# Patient Record
Sex: Male | Born: 1950
Health system: Southern US, Community
[De-identification: ages and names within clinical notes are randomized; demographics above are authoritative.]

## PROBLEM LIST (undated history)

## (undated) DIAGNOSIS — I712 Thoracic aortic aneurysm, without rupture: Secondary | ICD-10-CM

## (undated) DIAGNOSIS — C4490 Unspecified malignant neoplasm of skin, unspecified: Secondary | ICD-10-CM

## (undated) DIAGNOSIS — K5790 Diverticulosis of intestine, part unspecified, without perforation or abscess without bleeding: Secondary | ICD-10-CM

## (undated) DIAGNOSIS — E782 Mixed hyperlipidemia: Secondary | ICD-10-CM

## (undated) DIAGNOSIS — Z9289 Personal history of other medical treatment: Secondary | ICD-10-CM

## (undated) DIAGNOSIS — I447 Left bundle-branch block, unspecified: Secondary | ICD-10-CM

## (undated) DIAGNOSIS — R918 Other nonspecific abnormal finding of lung field: Secondary | ICD-10-CM

## (undated) DIAGNOSIS — D72829 Elevated white blood cell count, unspecified: Secondary | ICD-10-CM

## (undated) DIAGNOSIS — R161 Splenomegaly, not elsewhere classified: Secondary | ICD-10-CM

## (undated) DIAGNOSIS — E875 Hyperkalemia: Secondary | ICD-10-CM

## (undated) DIAGNOSIS — J8410 Pulmonary fibrosis, unspecified: Secondary | ICD-10-CM

## (undated) DIAGNOSIS — D500 Iron deficiency anemia secondary to blood loss (chronic): Secondary | ICD-10-CM

## (undated) DIAGNOSIS — E118 Type 2 diabetes mellitus with unspecified complications: Secondary | ICD-10-CM

## (undated) DIAGNOSIS — I2510 Atherosclerotic heart disease of native coronary artery without angina pectoris: Secondary | ICD-10-CM

## (undated) DIAGNOSIS — I7121 Aneurysm of the ascending aorta, without rupture: Secondary | ICD-10-CM

## (undated) DIAGNOSIS — I10 Essential (primary) hypertension: Secondary | ICD-10-CM

## (undated) DIAGNOSIS — M109 Gout, unspecified: Secondary | ICD-10-CM

## (undated) DIAGNOSIS — I5032 Chronic diastolic (congestive) heart failure: Secondary | ICD-10-CM

## (undated) DIAGNOSIS — K7581 Nonalcoholic steatohepatitis (NASH): Principal | ICD-10-CM

## (undated) DIAGNOSIS — I714 Abdominal aortic aneurysm, without rupture: Secondary | ICD-10-CM

## (undated) DIAGNOSIS — K7460 Unspecified cirrhosis of liver: Secondary | ICD-10-CM

## (undated) DIAGNOSIS — K7681 Hepatopulmonary syndrome: Secondary | ICD-10-CM

## (undated) DIAGNOSIS — E669 Obesity, unspecified: Secondary | ICD-10-CM

## (undated) DIAGNOSIS — K8020 Calculus of gallbladder without cholecystitis without obstruction: Secondary | ICD-10-CM

## (undated) DIAGNOSIS — N281 Cyst of kidney, acquired: Secondary | ICD-10-CM

## (undated) DIAGNOSIS — D509 Iron deficiency anemia, unspecified: Secondary | ICD-10-CM

## (undated) DIAGNOSIS — K7290 Hepatic failure, unspecified without coma: Secondary | ICD-10-CM

## (undated) DIAGNOSIS — R3129 Other microscopic hematuria: Secondary | ICD-10-CM

## (undated) HISTORY — DX: Splenomegaly, not elsewhere classified: R16.1

## (undated) HISTORY — DX: Left bundle-branch block, unspecified: I44.7

## (undated) HISTORY — DX: Diverticulosis of intestine, part unspecified, without perforation or abscess without bleeding: K57.90

## (undated) HISTORY — DX: Other microscopic hematuria: R31.29

## (undated) HISTORY — DX: Calculus of gallbladder without cholecystitis without obstruction: K80.20

## (undated) HISTORY — PX: TRANSTHORACIC ECHOCARDIOGRAM: SHX275

## (undated) HISTORY — DX: Cyst of kidney, acquired: N28.1

## (undated) HISTORY — DX: Unspecified cirrhosis of liver: K74.60

## (undated) HISTORY — DX: Elevated white blood cell count, unspecified: D72.829

## (undated) HISTORY — DX: Iron deficiency anemia secondary to blood loss (chronic): D50.0

## (undated) HISTORY — DX: Mixed hyperlipidemia: E78.2

## (undated) HISTORY — DX: Aneurysm of the ascending aorta, without rupture: I71.21

## (undated) HISTORY — PX: COLONOSCOPY: SHX174

## (undated) HISTORY — DX: Nonalcoholic steatohepatitis (NASH): K75.81

## (undated) HISTORY — PX: CARDIOVASCULAR STRESS TEST: SHX262

## (undated) HISTORY — DX: Obesity, unspecified: E66.9

## (undated) HISTORY — DX: Pulmonary fibrosis, unspecified: J84.10

## (undated) HISTORY — DX: Chronic diastolic (congestive) heart failure: I50.32

## (undated) HISTORY — DX: Hepatic failure, unspecified without coma: K72.90

## (undated) HISTORY — DX: Type 2 diabetes mellitus with unspecified complications: E11.8

## (undated) HISTORY — DX: Atherosclerotic heart disease of native coronary artery without angina pectoris: I25.10

## (undated) HISTORY — PX: OTHER SURGICAL HISTORY: SHX169

## (undated) HISTORY — DX: Gout, unspecified: M10.9

## (undated) HISTORY — DX: Essential (primary) hypertension: I10

## (undated) HISTORY — DX: Hyperkalemia: E87.5

## (undated) HISTORY — DX: Other nonspecific abnormal finding of lung field: R91.8

## (undated) HISTORY — PX: CARDIAC CATHETERIZATION: SHX172

## (undated) HISTORY — PX: TRANSCATHETER AORTIC VALVE REPLACEMENT, TRANSFEMORAL: SHX6400

## (undated) HISTORY — DX: Hepatopulmonary syndrome: K76.81

## (undated) HISTORY — DX: Abdominal aortic aneurysm, without rupture: I71.4

## (undated) HISTORY — PX: CORONARY ANGIOPLASTY: SHX604

## (undated) HISTORY — DX: Thoracic aortic aneurysm, without rupture: I71.2

## (undated) HISTORY — DX: Iron deficiency anemia, unspecified: D50.9

---
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
## 1975-02-08 HISTORY — PX: VASECTOMY: SHX75

## 1989-02-07 HISTORY — PX: NECK SURGERY: SHX720

## 1990-02-07 HISTORY — PX: CERVICAL DISCECTOMY: SHX98

## 2003-04-14 ENCOUNTER — Encounter (INDEPENDENT_AMBULATORY_CARE_PROVIDER_SITE_OTHER): Payer: Self-pay | Admitting: *Deleted

## 2003-04-14 ENCOUNTER — Ambulatory Visit (HOSPITAL_COMMUNITY): Admission: RE | Admit: 2003-04-14 | Discharge: 2003-04-14 | Payer: Self-pay | Admitting: Gastroenterology

## 2004-10-20 ENCOUNTER — Emergency Department (HOSPITAL_COMMUNITY): Admission: EM | Admit: 2004-10-20 | Discharge: 2004-10-20 | Payer: Self-pay | Admitting: Emergency Medicine

## 2006-03-10 HISTORY — PX: CORONARY ARTERY BYPASS GRAFT: SHX141

## 2006-03-28 ENCOUNTER — Encounter: Admission: RE | Admit: 2006-03-28 | Discharge: 2006-03-28 | Payer: Self-pay | Admitting: *Deleted

## 2006-03-31 ENCOUNTER — Ambulatory Visit: Admission: RE | Admit: 2006-03-31 | Discharge: 2006-03-31 | Payer: Self-pay | Admitting: *Deleted

## 2006-04-04 ENCOUNTER — Ambulatory Visit: Payer: Self-pay | Admitting: Cardiothoracic Surgery

## 2006-04-04 ENCOUNTER — Encounter: Payer: Self-pay | Admitting: Vascular Surgery

## 2006-04-05 ENCOUNTER — Ambulatory Visit: Payer: Self-pay | Admitting: Cardiothoracic Surgery

## 2006-04-05 ENCOUNTER — Inpatient Hospital Stay (HOSPITAL_COMMUNITY): Admission: RE | Admit: 2006-04-05 | Discharge: 2006-04-12 | Payer: Self-pay | Admitting: *Deleted

## 2006-04-27 ENCOUNTER — Ambulatory Visit: Payer: Self-pay | Admitting: Cardiothoracic Surgery

## 2006-04-27 ENCOUNTER — Encounter: Admission: RE | Admit: 2006-04-27 | Discharge: 2006-04-27 | Payer: Self-pay | Admitting: Cardiothoracic Surgery

## 2006-05-04 ENCOUNTER — Encounter (HOSPITAL_COMMUNITY): Admission: RE | Admit: 2006-05-04 | Discharge: 2006-08-02 | Payer: Self-pay | Admitting: *Deleted

## 2009-01-09 ENCOUNTER — Ambulatory Visit (HOSPITAL_COMMUNITY): Admission: RE | Admit: 2009-01-09 | Discharge: 2009-01-09 | Payer: Self-pay | Admitting: Gastroenterology

## 2010-06-25 NOTE — Discharge Summary (Signed)
William [name], William [name]               ACCOUNT NO.:  [id number]   MEDICAL RECORD NO.:  [id number]          PATIENT TYPE:  INP   LOCATION:  2023                         FACILITY:  [facility]   PHYSICIAN:  Lanelle Bal, MD    DATE OF BIRTH:  Jan 06, 1951   DATE OF ADMISSION:  04/05/2006  DATE OF DISCHARGE:                               DISCHARGE SUMMARY   DISCHARGE DATE:  Tentatively in 1 to 2 days.   ADMISSION DIAGNOSES:  Coronary artery disease.   DISCHARGE DIAGNOSES:  1. Coronary artery disease status post coronary artery bypass graft.  2. Postop ileus, now resolved.  3. Postop volume overload.  4. Postop acute blood loss anemia.  5. Hypertension.  6. Hyperlipidemia.   ALLERGIES:  NO KNOWN DRUG ALLERGIES.   PROCEDURES:  1. On April 04, 2006, the patient underwent cardiac catheterization      by Dr. Tami Ribas.  2. On April 05, 2006, the patient underwent coronary artery bypass      grafts x3 (left internal mammary artery to the left anterior      descending, saphenous vein graft to the right coronary artery,      saphenous vein graft to the circumflex), open saphenous vein      harvest, bilateral lower leg,  by Dr. Lanelle Bal.   CONSULTS:  Dr. Servando Snare was consulted on April 04, 2006, for  cardiothoracic surgery.   HISTORY AND PHYSICAL:  This is a 60 year old male with a history of  hypertension, hyperlipidemia, who is referred to the cardiology office  for exertional chest pain, which occurs with any significant exertion,  which is nitro responsive.  He was brought for a cardiac catheterization  on February 26 to rule out significant coronary artery disease.  He does  have a remote smoking history but has quit since 1983.   HOSPITAL COURSE:  The patient presents to the cath lab, and during  cardiac catheterization, showed significant three-vessel coronary artery  disease, normal left ventricular systolic function, small infrarenal  aneurysm, elevated left  ventricular end diastolic pressure, and systemic  hypertension.  The patient has had unstable angina concerns  progressively within the past 2-3 weeks and now presents with severe  left anterior descending disease, and totally occluded right and  relatively well-preserved left ventricular function.  It was best  thought that the patient undergo coronary artery bypass surgery in the  morning.  The patient underwent CABG x3 on April 05, 2006, without  any complications.  The patient was transferred to the SICU  postoperatively.  The patient's stay has been uneventful, and he has  been progressing as expected.   The patient has had a postop ileus.  The patient was kept n.p.o.  He was  nauseated on postoperative day #1, and he was given Zofran with relief.  The patient's abdomen was distended, and his bowel sounds were decreased  on postoperative day #3.  The patient did have positive flatus on  postoperative day #3.  On postoperative day #4, the patient's abdomen  was softer with normal bowel sounds, and his diet has been advanced  fully.  The patient is now on a regular heart-healthy diet, which he is  tolerating quite well.  His abdomen has bowel sounds positive, soft,  nontender, and nondistended.   The patient did have some acute blood loss anemia, did not require any  blood transfusions.  His hemoglobin and hematocrit have been stable.  Postoperatively, he had some volume overload.  He was diuresed with  Lasix and responding appropriately.  Currently, his weight is 104.7  kilograms, and his postoperative weight is 108 kilograms.   The patient has been able to be weaned from his vent without any  complication.  The patient has been breathing on room air with sats of  92%.  He has been doing his breathing exercises appropriately.  The  patient has mild serosanguineous drainage on his upper sternum on  postoperative day #5; however, by postoperative day #6, this has  resolved.    Postoperatively, the patient's blood pressure has been high, and his  Lopressor has been titrated up.  He is currently on 100 mg p.o. b.i.d.  The patient is slightly tachycardic.  Postoperative day #6, he was  started on his home dose of Avapro 150 mg.  This will be monitored  closely.  If any adjustments need to be made, this will be done in the  morning.  The patient has remained afebrile.  Renal function has been  intact with a BUN of 14 and creatinine of 0.7.   The patient is ambulating with cardiac rehab with a steady gait.  He  walked 690 feet on April 10, 2006.  The patient has been maintaining  sinus rhythm on telemetry.   DISCHARGE DISPOSITION:  The patient will be discharged home in the next  1-2 days provided his rhythm, blood pressure, and other vitals remain  stable.   DISCHARGE MEDICATIONS:  Include:  1. Lopressor 100 mg p.o. b.i.d.  2. Aspirin 325 mg p.o. daily.  3. Avapro 150 mg p.o. daily.  4. Crestor 20 mg p.o. daily.  5. Niacin 500 mg p.o. daily.  6. Lasix 40 mg p.o. daily for 4 days.  7. Potassium chloride 20 mEq p.o. daily for 4 days.  8. Oxycodone 5 mg 1 to 2 tabs every 4 hours p.r.n.   INSTRUCTIONS:  The patient is instructed to follow a low-fat, low-salt  diet.  No driving, heavy lifting greater than 10 pounds for 3 weeks.  The patient is to ambulate 3-4 times daily and increase activity as  tolerated.  He is to continue his breathing exercises.  He may shower  and clean his incisions with mild soap and water.  He should call our  office if any wound problems should arise, if his incision is  erythematous, drainage, if temp greater than 101.5.   FOLLOWUP:  The patient has a followup appointment with Dr. Servando Snare in 3  weeks, to contact him with time and date of appointment.  He is to have  a chest x-ray at [facility] prior to seeing Dr.  Servando Snare.  The patient is to call Dr. Tami Ribas for an appointment in 2  weeks.     Richardson Dopp, [place]      Lanelle Bal, MD  Electronically Signed    JMW/MEDQ  D:  04/11/2006  T:  04/11/2006  Job:  [id number]   cc:   Lanelle Bal, MD  Octavia Heir, MD  Esperanza Richters, MD

## 2010-06-25 NOTE — Op Note (Signed)
William [name], AHLGREN NO.:  [id number]   MEDICAL RECORD NO.:  [id number]          PATIENT TYPE:  INP   LOCATION:  2023                         FACILITY:  [facility]   PHYSICIAN:  Lanelle Bal, MD    DATE OF BIRTH:  September 09, 1950   DATE OF PROCEDURE:  04/05/2006  DATE OF DISCHARGE:                               OPERATIVE REPORT   PREOPERATIVE DIAGNOSIS:  Coronary occlusive disease.   POSTOPERATIVE DIAGNOSIS:  Coronary occlusive disease.   SURGICAL PROCEDURE:  Coronary artery bypass grafting times three with  the left internal mammary to the left anterior descending, reverse  saphenous vein graft to the circumflex and reverse saphenous vein graft  to the posterior descending coronary artery with failed attempt at  endovein harvesting.   SURGEON:  Dr. Servando Snare.   FIRST ASSISTANT:  Suzzanne Cloud, P.A.   BRIEF HISTORY:  The patient is a 60 year old male who presents with  unstable anginal symptoms.  Because of unstable anginal symptoms he  underwent cardiac catheterization which demonstrated high grade LAD  stenosis, occlusion of the posterior descending coronary artery and  proximal circumflex obstruction.  Because of the patient's three vessel  disease, coronary artery bypass grafting was recommended, the patient  agreed and signed informed consent.  Attempts at endovein were started.  A small incision was made first at the right knee and then at the left  knee, but neither could a satisfactory vein be located and harvested,  and therefore a segment of lower leg vein from each lower extremity was  harvested with an open technique.  A median sternotomy was performed,  the left internal mammary artery was dissected down as a pedicle graft,  the distal artery was divided and had good free flow.  The pericardium  was opened and overall showed preserved LV function.  The patient at the  time of catheterization noted to have significantly elevated left end-  diastolic  pressures.  Patient was systemically heparinized, the  ascending aortic in the right atrium were cannulated, and aortic root  bent cardioplegia needle was introduced into the ascending aorta.  The  patient was placed on cardiopulmonary bypass 2.4L/min per m2, sites of  anastomosis were inspected and dissected out of the epicardium.  The  patient's body temperature was cooled to 30 degrees, aortic crossclamp  was applied and 500 mL of cold blood potassium cardioplegia was  administered with rapid diastolic arrest of the heart, myocardial septal  temperatures were monitored throughout the crossclamp.  Attention was  turned first to the posterior descending coronary artery which was  opened and admitted a 1.5 mm probe.  Using a running #7-0 Prolene,  distal anastomosis was performed.  Attention was then turned to the  circumflex which was opened, and using a running #7-0 Prolene distal  anastomosis was performed.  Attention was then turned to the left  anterior descending coronary artery.  In the mid portion, the vessel was  opened, admitted a 1.5 mm probe.  Using a running #8-0 Prolene, the left  internal mammary artery was anastomosed to the left anterior descending  coronary artery.  With the crossclamp still in place, two punch  aortotomies were performed.  Each of the two vein grafts were  anastomosed to the ascending aorta.  Prior to completion of the final  proximal anastomosis the heart was allowed to passively fill and de-air.  Aortic crossclamp was removed.  Total crossclamp time was 66 minutes.  The patient spontaneously converted to a sinus rhythm.  Sites of  anastomosis were inspected and were free of bleeding.  Ventricle and  atrial pacing wires were placed.  The patient was then ventilated and  weaned from cardiopulmonary bypass on low dose milrinone and dopamine.  He remained hemodynamically stable.  He was decannulated in the usual  fashion.  Protamine sulfate was  administered.  With the operative field  hemostatic, two atrial tubes and two ventricular pacing wires were  applied.  A graft marker was applied.  A left pleural tube and a single  Blake mediastinal drain were placed.  Sternum was closed with a #6  stainless steel wire.  Fascia closed with interrupted #0 Vicryl, running  #3-0 Vicryl in subcutaneous tissue, #4-0 subcuticular stitch in the skin  edges.  Dry dressings were applied.  The sponge and needle count was  reported as correct at the completion of the procedure.  The patient  tolerated the procedure without obvious complication and was transferred  to the surgical intensive care unit.      Lanelle Bal, MD  Electronically Signed     EG/MEDQ  D:  04/09/2006  T:  04/09/2006  Job:  [id number]   cc:   Quay Burow, M.D.

## 2010-06-25 NOTE — Op Note (Signed)
William [name], William [name]                           ACCOUNT NO.:  [id number]   MEDICAL RECORD NO.:  [id number]                   PATIENT TYPE:  AMB   LOCATION:  ENDO                                 FACILITY:  [facility]   PHYSICIAN:  Nelwyn Salisbury, M.D.               DATE OF BIRTH:  11-25-1950   DATE OF PROCEDURE:  04/14/2003  DATE OF DISCHARGE:                                 OPERATIVE REPORT   PROCEDURE PERFORMED:  Colonoscopy with snare polypectomy x2.   ENDOSCOPIST:  Nelwyn Salisbury, M.D.   INSTRUMENT USED:  Olympus video colonoscope.   INDICATION FOR PROCEDURE:  A 60 year old white male undergoing screening  colonoscopy to rule out colonic polyps, masses, etc.   PREPROCEDURE PREPARATION:  Informed consent was procured from the patient.  The patient was fasted for eight hours prior to the procedure and prepped  with a bottle of magnesium citrate and a gallon of GoLYTELY the night prior  to the procedure.   PREPROCEDURE PHYSICAL:  VITAL SIGNS:  The patient had stable vital signs.  NECK:  Supple.  CHEST:  Clear to auscultation.  S1, S2 regular.  ABDOMEN:  Soft with normal bowel sounds.   DESCRIPTION OF PROCEDURE:  The patient was placed in the left lateral  decubitus position and sedated with 50 mg of Demerol and 6 mg of Versed  intravenously.  Once the patient was adequately sedate and maintained on low-  flow oxygen and continuous cardiac monitoring, the Olympus video colonoscope  was advanced from the rectum to the cecum with difficulty.  There was some  residual stool in the colon.  Multiple washes were done.  The patient's  position was changed from the left lateral to the supine position to reach  the cecal base.  The appendiceal orifice and the ileocecal valve were  visualized and photographed.  A small sessile polyp was snared from the mid-  right colon and a smaller flat polyp was snared from the proximal transverse  colon at the hepatic flexure.  Retroflexion in the rectum  revealed small  internal hemorrhoids.  The patient tolerated the procedure well without  complications.   IMPRESSION:  1. Small, nonbleeding internal hemorrhoids.  2. Small polyp snared from mid-right colon, another larger polyp snared from     transverse colon at the hepatic flexure.  Both were placed in the same     bottle.  3. A large amount of residual stool in the colon, small lesions could have     been missed.   RECOMMENDATIONS:  1. Await pathology results.  2. Avoid nonsteroidals, including aspirin, for the next four weeks.  3. Outpatient follow-up in the next two weeks for further recommendations.  4. Repeat CRC screening depending on pathology results.  Nelwyn Salisbury, M.D.    JNM/MEDQ  D:  04/14/2003  T:  04/15/2003  Job:  [id number]   cc:   Youlanda Roys. Deatra Ina, M.D.  [address]  [place]  [place] [address]  Fax: [phone]

## 2010-06-25 NOTE — Consult Note (Signed)
William, [name] NO.:  [id number]   MEDICAL RECORD NO.:  [id number]          PATIENT TYPE:  OIB   LOCATION:  2807                         FACILITY:  [facility]   PHYSICIAN:  Lanelle Bal, MD    DATE OF BIRTH:  May 02, 1950   DATE OF CONSULTATION:  04/04/2006  DATE OF DISCHARGE:                                 CONSULTATION   CARDIAC SURGERY CONSULTATION   FOLLOWUP CARDIOLOGIST:  Dr. Tami Ribas.   PRIMARY CARE PHYSICIAN:  Dr. Tawni Millers at [facility].   The patient presents to the Cath Lab today for cardiac catheterization  and an urgent consult.  He has a previous history of hypertension,  hyperlipidemia, and over the past 2-3 weeks has had increasing episodes  of substernal chest discomfort radiating to the left upper chest and  migrating down his arms and into the left wrist usually associated with  exertion, such as climbing stairs or carrying wood.  He denies shortness  of breath.  Denies nausea or diaphoresis.  The pain is resolved with  rest.  Over the past week, he had noted increasing frequency and a  decreasing level of exertion that brings on the discomfort.  Because of  these symptoms, he was referred to Dr. Tami Ribas for cardiac  catheterization today.  He has had no previous history of myocardial  infarction.  Does have hypertension, hyperlipidemia.  Denies diabetes.  Is a remote smoker, quit in 1983.  He has a positive family history for  cardiac disease.  Father had a myocardial infarction in his 51s.  No  coronary disease noted in his mother.  He has 1 brother who at age 31  had a myocardial infarction. Older brother died approximately 1 week ago  from cancer.  The patient has had no previous stroke.  Denies  claudication.  Denies renal insufficiency.   PREVIOUS SURGERY:  Included:  1. Cervical spine fusion.  2. Polypectomy, right colon, 2005.   SOCIAL HISTORY:  The patient is married and lives with his wife, [name],  who works as  a Marine scientist at [employer].  Plays golf once a week, employed as  a Curator.   MEDICATIONS AT THE TIME OF ADMISSION:  Include:  1. Avapro 150 mg b.i.d.  2. Aspirin 81 mg daily.  3. Crestor 20 mg daily.  4. Ziac 6.25 daily.  5. Lopid 600 mg b.i.d.   He denies any allergies.   There is no record of Plavix being given in the hospital.   REVIEW OF SYSTEMS:  CARDIAC:  Positive for chest pain.  He denies  resting shortness of breath, exertional shortness of breath, orthopnea,  presyncope, syncope, palpitations, lower extremity edema.  GENERAL:  The  patient is emotionally labile currently with the recent loss of his  brother secondary to cancer a week and a half ago.  RESPIRATORY:  Denies  any recent respiratory infections or hemoptysis.  He has had a history  of small internal hemorrhoids.  Has a history of cervical spondylosis  but, once operated on, this has not caused him any further trouble.  Denies any urinary  problems.  Denies recent infection.  Denies for easy  bruisability.  Other review of systems is negative.   PHYSICAL EXAM:  Blood pressure 113/86.  Pulse is 88 and regular.  Respiratory rate 25.  O2 sats 97%.  He is 5 feet 7 inches tall, weighs  237 pounds.  The patient is awake, alert, and neurologically intact.  He has no carotid bruits.  No jugular venous distention.  LUNGS:  Clear bilaterally.  CARDIAC:  Regular rate and rhythm without murmur or gallop.  ABDOMEN:  Soft without palpable masses or tenderness.  He has 2+ DP and  PT pulses bilaterally.   LABORATORY FINDINGS:  Included a white count of 9.2, hematocrit of 48.8,  platelet count 319.  INR 1.1, PTT 41.  BUN is 10.  Creatinine 0.9.  Glucose 82.  Cardiac catheterization films are reviewed.  He has luminal  irregularities in his left main, very high-grade.  A proximal LAD lesion  that is very close to the takeoff of the circumflex.  It was not felt to  be suitable for angioplasty.  The right coronary artery is  totally  occluded.  He has a small first obtuse marginal with 90% that is  probably too small to bypass.   IMPRESSION:  Patient with unstable anginal symptoms progressing over the  past 2-3 weeks, who now presents with severe left anterior descending  disease and totally occluded right and relatively well-preserved left  ventricular function.  I agree with Dr. Tami Ribas to proceed with coronary  artery bypass grafting sooner rather than later.  Tentatively, the risks  and options have been discussed with the patient and his family in  detail.  He is willing to proceed.  We will plan to keep the patient in  the hospital and proceed with bypass surgery tomorrow morning as long as  he remains stable without pain.  The risks of surgery include death,  infection, stroke, myocardial infarction, bleeding, and blood  transfusion were all discussed in detail with the patient and he is  willing to proceed.      Lanelle Bal, MD  Electronically Signed     EG/MEDQ  D:  04/04/2006  T:  04/04/2006  Job:  [id number]

## 2010-06-25 NOTE — Cardiovascular Report (Signed)
William [name], BOUWMAN               ACCOUNT NO.:  [id number]   MEDICAL RECORD NO.:  [id number]          PATIENT TYPE:  INP   LOCATION:  2399                         FACILITY:  [facility]   PHYSICIAN:  Octavia Heir, MD  DATE OF BIRTH:  1950-10-14   DATE OF PROCEDURE:  DATE OF DISCHARGE:                            CARDIAC CATHETERIZATION   PROCEDURE:  1. Left heart catheterization.  2. Coronary angiography.  3. Left ventriculogram.  4. Abdominal aortogram.   ATTENDING PHYSICIAN:  Alla German, MD.   COMPLICATIONS:  None.   INDICATION:  Mr. Hallowell is a 60 year old male, patient of Dr. Esperanza Richters, with a history of hypertension and hyperlipidemia who was  referred to our office for exertional chest pain which occurs with any  significant exertion which is nitro responsive.  He is now brought for a  cardiac catheterization to rule out significant CAD.  He does have a  remote smoking history but has been quit since 1983.   DESCRIPTION OF PROCEDURE:  After obtaining informed written consent,  patient brought to the cardiac cath lab, right groin shaved, prepped and  draped in the usual fashion.  Anesthesia monitor established.  Using a  modified Seldinger technique, a 6-French intraarterial sheath inserted  in the right femoral artery.  A 6-French diagnostic catheter was used to  perform diagnostic angiography.   The left main is a medium-to-large-sized vessel with a small aneurysm  noted but no significant disease.   The LAD is a large vessel which courses to the apex and gives  collaterals to the distal RCA.  The ostial LAD is noted to have a 70%  stenosis followed by 99% proximal stenosis.  There is no further  significant disease of the LAD.   The left coronary artery is a medium-to-large-sized ramus intermedius  which gives rise to its proximal segment no significant disease.   The left circumflex is a medium-sized vessel which courses to the AV  groove and two  obtuse marginal branches.  The AV groove circumflex is  noted to have mild 30% mid vessel narrowing.   The first diagonal is a small vessel with 90% ostial lesion.   The second OM is a medium-sized vessel with no significant disease.   The right coronary artery is subtotally occluded in its mid vessel.  The  distal RCA is filling via collaterals from the left.   Left ventriculogram reveals a low-normal EF of 50% with inferior  hypokinesis.   Abdominal aortogram reveals a small infrarenal aneurysm.   Hemodynamics:  Systemic arterial pressure 170/112, LV systemic pressure  174/17, LVEDP of 36.   CONCLUSION:  1. Significant 3-vessel coronary artery disease.  2. Normal left ventricular systolic function.  3. Small infrarenal aneurysm.  4. Elevated left ventricular end diastolic pressure.  5. Systemic hypertension.      Octavia Heir, MD  Electronically Signed     RHM/MEDQ  D:  04/04/2006  T:  04/05/2006  Job:  [id number]   cc:   Esperanza Richters, MD

## 2011-12-22 ENCOUNTER — Other Ambulatory Visit (HOSPITAL_COMMUNITY): Payer: Self-pay

## 2011-12-22 DIAGNOSIS — I2510 Atherosclerotic heart disease of native coronary artery without angina pectoris: Secondary | ICD-10-CM

## 2012-01-17 ENCOUNTER — Ambulatory Visit (HOSPITAL_COMMUNITY)
Admission: RE | Admit: 2012-01-17 | Discharge: 2012-01-17 | Disposition: A | Discharge status: Home / Self Care | Payer: 59 | Source: Ambulatory Visit | Attending: Cardiovascular Disease | Admitting: Cardiovascular Disease

## 2012-01-17 DIAGNOSIS — I10 Essential (primary) hypertension: Secondary | ICD-10-CM | POA: Insufficient documentation

## 2012-01-17 DIAGNOSIS — E669 Obesity, unspecified: Secondary | ICD-10-CM | POA: Insufficient documentation

## 2012-01-17 DIAGNOSIS — E785 Hyperlipidemia, unspecified: Secondary | ICD-10-CM | POA: Insufficient documentation

## 2012-01-17 DIAGNOSIS — I2510 Atherosclerotic heart disease of native coronary artery without angina pectoris: Secondary | ICD-10-CM | POA: Insufficient documentation

## 2012-01-17 MED ORDER — TECHNETIUM TC 99M SESTAMIBI GENERIC - CARDIOLITE
25.1000 | Freq: Once | INTRAVENOUS | Status: AC | PRN
  Administered 2012-01-17: 25.1 via INTRAVENOUS

## 2012-01-17 MED ORDER — TECHNETIUM TC 99M SESTAMIBI GENERIC - CARDIOLITE
8.4000 | Freq: Once | INTRAVENOUS | Status: AC | PRN
  Administered 2012-01-17: 8 via INTRAVENOUS

## 2012-01-17 NOTE — Procedures (Addendum)
[name]  Cardiology Nuclear Med Study  William [name] is a 61 y.o. male     MRN : [id number]     DOB: Apr 17, 1950  Procedure Date: 01/17/2012  Nuclear Med Background Indication for Stress Test:  Graft Patency History:  CAD Cardiac Risk Factors: Hypertension, Lipids and Obesity  Symptoms:  None   Nuclear Pre-Procedure Caffeine/Decaff Intake:  7:00pm NPO After: 5:00am   IV Site: R Hand  IV 0.9% NS with Angio Cath:  22g  Chest Size (in):  52 IV Started by: Otho Perl, CNMT  Height: 5' 7"  (1.702 m)  Cup Size: n/a  BMI:  Body mass index is 39.94 kg/(m^2). Weight:  255 lb (115.667 kg)   Tech Comments:  n/a    Nuclear Med Study 1 or 2 day study: 1 day  Stress Test Type:  Stress  Order Authorizing Provider:  Shelva Majestic, MD   Resting Radionuclide: Technetium 29mSestamibi  Resting Radionuclide Dose: 8.4 mCi   Stress Radionuclide:  Technetium 974mestamibi  Stress Radionuclide Dose: 25.1 mCi           Stress Protocol Rest HR: 101 Stress HR:162  Rest BP: 148/89 Stress BP:215/98  Exercise Time (min): 08:01 METS: 10.10          Dose of Adenosine (mg):  n/a Dose of Lexiscan: n/a mg  Dose of Atropine (mg): n/a Dose of Dobutamine: n/a mcg/kg/min (at max HR)  Stress Test Technologist: GwMellody MemosCCT Nuclear Technologist: PaImagene RichesCNMT   Rest Procedure:  Myocardial perfusion imaging was performed at rest 45 minutes following the intravenous administration of Technetium 9947mstamibi. Stress Procedure:  The patient performed treadmill exercise using a Bruce  Protocol for 08:01 minutes. The patient stopped due to extreme shortness of breath and fatigue.Patient denied any chest pain.  There were no significant ST-T wave changes.  Technetium 83m71mtamibi was injected at peak exercise and myocardial perfusion imaging was performed after a brief delay.  Transient  Ischemic Dilatation (Normal <1.22):  1.0 Lung/Heart Ratio (Normal <0.45):  0.37 QGS EDV:  74  ml QGS ESV:  26  ml LV Ejection Fraction: 64%      Rest ECG: NSR with non-specific ST-T wave changes and left axis deviation and poor anterior R wave progression.  Stress ECG: Insignificant upsloping ST segment depression., There are scattered PVCs. and A 4-beat run of VT is seen in stage 2 of exercise, but did not recur during peak exercise or recovery.  QPS Raw Data Images:  Normal; no motion artifact; normal heart/lung ratio. Stress Images:  Normal homogeneous uptake in all areas of the myocardium. Rest Images:  inferior wall artifact is seen Subtraction (SDS):  No evidence of ischemia. LV Wall Motion:  NL LV Function; NL Wall Motion or postoperative paradoxical septal motion is seen.  Impression Exercise Capacity:  Good exercise capacity. BP Response:  Hypertensive blood pressure response. Clinical Symptoms:  There is dyspnea. ECG Impression:  No significant ST segment change suggestive of ischemia. Comparison with Prior Nuclear Study: No significant change from previous study  Overall Impression:  Normal stress nuclear study.    CROISanda Klein  01/17/2012 1:36 PM

## 2012-06-04 ENCOUNTER — Encounter: Payer: Self-pay | Admitting: Cardiovascular Disease

## 2012-08-24 ENCOUNTER — Ambulatory Visit (INDEPENDENT_AMBULATORY_CARE_PROVIDER_SITE_OTHER): Payer: 59 | Admitting: Cardiovascular Disease

## 2012-08-24 ENCOUNTER — Encounter: Payer: Self-pay | Admitting: Cardiovascular Disease

## 2012-08-24 VITALS — BP 118/86 | Ht 69.0 in | Wt 271.3 lb

## 2012-08-24 DIAGNOSIS — I2510 Atherosclerotic heart disease of native coronary artery without angina pectoris: Secondary | ICD-10-CM

## 2012-08-24 DIAGNOSIS — I10 Essential (primary) hypertension: Secondary | ICD-10-CM | POA: Insufficient documentation

## 2012-08-24 DIAGNOSIS — E8881 Metabolic syndrome: Secondary | ICD-10-CM

## 2012-08-24 DIAGNOSIS — Z951 Presence of aortocoronary bypass graft: Secondary | ICD-10-CM | POA: Insufficient documentation

## 2012-08-24 DIAGNOSIS — E782 Mixed hyperlipidemia: Secondary | ICD-10-CM

## 2012-08-24 NOTE — Progress Notes (Signed)
Patient ID: William [name], male   DOB: 1950-10-21, 62 y.o.   MRN: [id number]     HPI: William [name], is a 62 y.o. male who presents to the office today for six-month cardiology evaluation.  [name] is a 62 year old gentleman who has known coronary artery disease in February 2008 underwent CABG revascularization surgery with a LIMA to the LAD, SVG to the RCA, SVG to the circumflex vessel. He has a history of obesity, metabolic syndrome, and mixed hyperlipidemia with preponderance of small dense LDL some particles and increased help her leg for which she's been on combination therapy.  In December 2000 at 89 a nuclear perfusion study was essentially unchanged from 2 years previously and did not show significant scar or ischemia. In January 2014, and I adjusted his antihypertensive medications and place him on Benicar HCT 40/1295 for optimal blood pressure control increase diuretic regimen and took him off his Avalide. He has felt improved on this therapy. [name] has not been successful in attempts at weight loss. He also does note some dietary discretion. He has noticed some mild leg swelling intermittently. He denies recent anginal symptoms. He denies palpitations. He presents for evaluation.  Past Medical History  Diagnosis Date  . Hypertension   . Hyperlipidemia     Past Surgical History  Procedure Laterality Date  . Cardiac catheterization    . Coronary artery bypass graft    . Coronary angioplasty      Allergies  Allergen Reactions  . Norvasc (Amlodipine Besylate) Swelling    Current Outpatient Prescriptions  Medication Sig Dispense Refill  . aspirin 81 MG tablet Take 81 mg by mouth daily.      . cholecalciferol (VITAMIN D) 1000 UNITS tablet Take 1,000 Units by mouth daily.      . cloNIDine (CATAPRES) 0.1 MG tablet Take 0.1 mg by mouth 2 (two) times daily.      . metoprolol (LOPRESSOR) 100 MG tablet Take 100 mg by mouth 2 (two) times daily.      . niacin (NIASPAN) 1000  MG CR tablet Take 1,000 mg by mouth at bedtime. 2 TABLETS      . olmesartan-hydrochlorothiazide (BENICAR HCT) 40-25 MG per tablet Take 1 tablet by mouth daily.      [name] omega-3 acid ethyl esters (LOVAZA) 1 G capsule Take 2 g by mouth daily.      . rosuvastatin (CRESTOR) 20 MG tablet Take 20 mg by mouth daily.       No current facility-administered medications for this visit.    Socially he's married has 3 children 3 grandchildren. Does not routinely exercise. In the past he had been walking 4 miles on a treadmill but has not done this in some time. There is no tobacco or alcohol use.  ROS is negative for fevers, chills or night sweats. He denies weight loss. There is no wheezing. He denies tachycardia palpitations. He does note some mild soreness of breath. There is no chest pain. He denies abdominal pain. Denies bleeding. He does note some intermittent very mild leg swelling. He denies paresthesias.  Other system review is negative.  PE BP 118/86  Ht 5' 9"  (1.753 m)  Wt 271 lb 4.8 oz (123.061 kg)  BMI 40.05 kg/m2  General: Alert, oriented, no distress.  Skin: normal turgor, no rashes HEENT: Normocephalic, atraumatic. Pupils round and reactive; sclera anicteric;no lid lag.  Nose without nasal septal hypertrophy Mouth/Parynx benign; Mallinpatti scale 3 Neck: Thick neck; No JVD, no carotid briuts  Lungs: clear to ausculatation and percussion; no wheezing or rales Heart: RRR, s1 s2 normal 1/6 systolic murmur. Abdomen: Moderately severe abdominal obesity. soft, nontender; no hepatosplenomehaly, BS+; abdominal aorta nontender and not dilated by palpation. Pulses 2+ Extremities: Trivial pretibial edema bilaterally above the sock line. no clubbing cyanosis, Homan's sign negative  Neurologic: grossly nonfocal  ECG:  Sinus rhythm at 57 beats per minute. Borderline first-degree block with a period of 1214 ms. No significant ST abnormalities.  LABS:  BMET No results found for this basename: na,  k, cl, co2, glucose, bun, creatinine, calcium, gfrnonaa, gfraa     Hepatic Function Panel  No results found for this basename: prot, albumin, ast, alt, alkphos, bilitot, bilidir, ibili     CBC No results found for this basename: wbc, rbc, hgb, hct, plt, mcv, mch, mchc, rdw, neutrabs, lymphsabs, monoabs, eosabs, basosabs     BNP No results found for this basename: probnp    Lipid Panel  No results found for this basename: chol, trig, hdl, cholhdl, vldl, ldlcalc     RADIOLOGY: No results found.    ASSESSMENT AND PLAN:  [name] is now 6 years status post a redo revascularization surgery x4. His nuclear perfusion study in December 2013 continued to show fairly normal perfusion without scar or ischemia. He does have mixed hyperlipidemia and seems to be tolerating combination therapy with Niaspan 2000 mg and Crestor 20 mg without side effects. In February 2014 cholesterol is 128 triglycerides 205 HDL still remains low at 30 LDL was 57. VLDL was minimally increased at 41. A long discussion with him regarding the importance of additional weight loss and dietary adherence. He admits that he has been eating more french fries recently. I discussed that in one month hopefully he will be able to lose 6-8 pounds and then thereafter perhaps lose at least 1 pound per week. We discussed the importance of increased exercise. He is now in the morbidly obese category. I will see him in 6 months for cardiology reassessment.      Troy Sine, MD, [facility]  08/24/2012 9:31 AM

## 2012-08-24 NOTE — Patient Instructions (Signed)
Your physician recommends that you schedule a follow-up appointment in: 6 months

## 2013-02-19 ENCOUNTER — Other Ambulatory Visit: Payer: Self-pay | Admitting: Cardiovascular Disease

## 2013-02-19 NOTE — Telephone Encounter (Signed)
Rx was sent to pharmacy electronically. 

## 2013-03-05 ENCOUNTER — Other Ambulatory Visit: Payer: Self-pay | Admitting: Cardiovascular Disease

## 2013-03-05 NOTE — Telephone Encounter (Signed)
Rx was sent to pharmacy electronically. 

## 2013-03-12 ENCOUNTER — Other Ambulatory Visit: Payer: Self-pay | Admitting: Urology

## 2013-03-12 DIAGNOSIS — R9389 Abnormal findings on diagnostic imaging of other specified body structures: Secondary | ICD-10-CM

## 2013-03-12 DIAGNOSIS — D490 Neoplasm of unspecified behavior of digestive system: Secondary | ICD-10-CM

## 2013-03-15 ENCOUNTER — Ambulatory Visit (HOSPITAL_COMMUNITY)
Admission: RE | Admit: 2013-03-15 | Discharge: 2013-03-15 | Disposition: A | Discharge status: Home / Self Care | Payer: 59 | Source: Ambulatory Visit | Attending: Urology | Admitting: Urology

## 2013-03-15 DIAGNOSIS — R9389 Abnormal findings on diagnostic imaging of other specified body structures: Secondary | ICD-10-CM

## 2013-03-15 DIAGNOSIS — K8020 Calculus of gallbladder without cholecystitis without obstruction: Secondary | ICD-10-CM | POA: Insufficient documentation

## 2013-03-15 DIAGNOSIS — I714 Abdominal aortic aneurysm, without rupture, unspecified: Secondary | ICD-10-CM | POA: Insufficient documentation

## 2013-03-15 DIAGNOSIS — K7689 Other specified diseases of liver: Secondary | ICD-10-CM | POA: Insufficient documentation

## 2013-03-15 DIAGNOSIS — D490 Neoplasm of unspecified behavior of digestive system: Secondary | ICD-10-CM

## 2013-03-15 DIAGNOSIS — N281 Cyst of kidney, acquired: Secondary | ICD-10-CM | POA: Insufficient documentation

## 2013-03-15 MED ORDER — GADOBENATE DIMEGLUMINE 529 MG/ML IV SOLN
20.0000 mL | Freq: Once | INTRAVENOUS | Status: AC | PRN
  Administered 2013-03-15: 20 mL via INTRAVENOUS

## 2013-03-19 ENCOUNTER — Encounter: Payer: Self-pay | Admitting: Cardiovascular Disease

## 2013-03-19 ENCOUNTER — Ambulatory Visit (INDEPENDENT_AMBULATORY_CARE_PROVIDER_SITE_OTHER): Payer: 59 | Admitting: Cardiovascular Disease

## 2013-03-19 VITALS — BP 118/88 | HR 69 | Ht 69.0 in | Wt 269.9 lb

## 2013-03-19 DIAGNOSIS — I10 Essential (primary) hypertension: Secondary | ICD-10-CM

## 2013-03-19 DIAGNOSIS — I714 Abdominal aortic aneurysm, without rupture, unspecified: Secondary | ICD-10-CM

## 2013-03-19 DIAGNOSIS — I2510 Atherosclerotic heart disease of native coronary artery without angina pectoris: Secondary | ICD-10-CM

## 2013-03-19 DIAGNOSIS — R5381 Other malaise: Secondary | ICD-10-CM

## 2013-03-19 DIAGNOSIS — E782 Mixed hyperlipidemia: Secondary | ICD-10-CM

## 2013-03-19 DIAGNOSIS — R319 Hematuria, unspecified: Secondary | ICD-10-CM

## 2013-03-19 DIAGNOSIS — R5383 Other fatigue: Secondary | ICD-10-CM

## 2013-03-19 DIAGNOSIS — E8881 Metabolic syndrome: Secondary | ICD-10-CM

## 2013-03-19 NOTE — Progress Notes (Signed)
Patient ID: William [name], male   DOB: Aug 14, 1950, 63 y.o.   MRN: [id number]      HPI: William [name], is a 63 y.o. male who presents to the office today for six-month cardiology evaluation.  William [name] is a 63 year old gentleman who has known coronary artery disease and underwent CABG revascularization surgery with a LIMA to the LAD, SVG to the RCA, SVG to the circumflex vessel in February 2008.  He has a history of obesity, metabolic syndrome, and mixed hyperlipidemia with preponderance of small dense LDL some particles and increased TG for which he has been on combination therapy.  In December 2013 a nuclear perfusion study was essentially unchanged from 2 years previously and did not show significant scar or ischemia. In January 2014,  I adjusted his antihypertensive medications and started  Benicar HCT 40/1295 for optimal blood pressure control with increase diuretic regimen and took him off his Avalide. He has felt improved on this therapy. William [name] has not been successful in attempts at weight loss. He also does note some dietary discretion. He has noticed some mild leg swelling intermittently. He denies recent anginal symptoms. He denies palpitations. He presents for evaluation.  Review of his chart today indicates that in 2008 after his bypass surgery and every completed cardiac rehabilitation he had reduced his weight from 2:30 down to 216 pounds. Over the past 7 years, he is consistently increased his weight such that today he weighed 269 pounds.  Since I last saw him, he tells me he did undergo urologic evaluation for gross hematuria. A CT of his abdomen and pelvis did not reveal any evidence for renal calculi or hydronephrosis or evidence for you renal calculi or dilatation. He did have small benign renal cysts. He was also noted a 3.5 cm infrarenal abdominal aortic aneurysm. There also a suggestion of fatty infiltration of his liver.  Past Medical History  Diagnosis Date  .  Hypertension   . Hyperlipidemia     Past Surgical History  Procedure Laterality Date  . Cardiac catheterization    . Coronary artery bypass graft    . Coronary angioplasty      No Active Allergies  Current Outpatient Prescriptions  Medication Sig Dispense Refill  . aspirin 325 MG tablet Take 325 mg by mouth daily.      [name] BENICAR HCT 40-25 MG per tablet TAKE 1 TABLET BY MOUTH ONCE DAILY  90 tablet  1  . cholecalciferol (VITAMIN D) 1000 UNITS tablet Take 1,000 Units by mouth daily.      . cloNIDine (CATAPRES) 0.1 MG tablet TAKE 1 TABLET BY MOUTH TWICE DAILY  180 tablet  1  . CRESTOR 20 MG tablet TAKE 1 TABLET BY MOUTH AT BEDTIME  90 tablet  1  . metoprolol (LOPRESSOR) 100 MG tablet Take 100 mg by mouth 2 (two) times daily.      . niacin (NIASPAN) 1000 MG CR tablet TAKE 2 TABLETS BY MOUTH AT BEDTIME  180 tablet  1  . omega-3 acid ethyl esters (LOVAZA) 1 G capsule TAKE 2 CAPSULES BY MOUTH ONCE DAILY  180 capsule  1   No current facility-administered medications for this visit.    Socially he's married has 3 children 3 grandchildren. Does not routinely exercise. In the past he had been walking 4 miles on a treadmill but has not done this in some time. There is no tobacco or alcohol use.  ROS is negative for fevers, chills or night sweats. There is  consistently gain. He denies change in vision or hearing. He denies lymphadenopathy. He denies recent significant musculoskeletal symptoms. He denies wheezing, increased cough or sputum production. There are no palpitations. He denies presyncope or syncope. He denies anginal type symptoms.  He does note some mild soreness of breath. He denies abdominal pain nausea vomiting or diarrhea. However, he recently was found to have some mild blood in the stool and underwent a comprehensive urologic evaluation. This revealed the presence of kidney cysts and possible pelvis the area.  He denies further bleeding. He does note some intermittent very mild leg  swelling. He denies paresthesias.  He does have metabolic syndrome but not frank diabetes. He denies cold intolerance. Other comprehensive 14 system review is negative.  PE BP 118/88  Pulse 69  Ht 5' 9"  (1.753 m)  Wt 269 lb 14.4 oz (122.426 kg)  BMI 39.84 kg/m2  General: Alert, oriented, no distress.  Skin: normal turgor, no rashes HEENT: Normocephalic, atraumatic. Pupils round and reactive; sclera anicteric;no lid lag. No xanthelasmas. No lid lag. Nose without nasal septal hypertrophy Mouth/Parynx benign; Mallinpatti scale 3 Neck: Thick neck; No JVD, no carotid bruits normal carotid upstroke. Lungs: clear to ausculatation and percussion; no wheezing or rales Heart: RRR, s1 s2 normal 1/6 systolic murmur. Chest wall: Nontender to palpation Abdomen: Moderately severe abdominal obesity. soft, nontender; no hepatosplenomehaly, BS+; abdominal aorta nontender and not dilated by palpation. Back: No CVA tenderness Pulses 2+ Extremities: Trivial pretibial edema bilaterally above the sock line. no clubbing cyanosis, Homan's sign negative  Neurologic: grossly nonfocal Psychological: Normal affect and mood  ECG (independently read by me): Normal sinus rhythm at 69 beats per minute. No ectopy. Borderline first-degree block with a PR interval of 216 ms.  Prior ECG of July 18,2014:  Sinus rhythm at 57 beats per minute. Borderline first-degree block with a period of 1214 ms. No significant ST abnormalities.  LABS:  BMET No results found for this basename: na,  k,  cl,  co2,  glucose,  bun,  creatinine,  calcium,  gfrnonaa,  gfraa     Hepatic Function Panel  No results found for this basename: prot,  albumin,  ast,  alt,  alkphos,  bilitot,  bilidir,  ibili     CBC No results found for this basename: wbc,  rbc,  hgb,  hct,  plt,  mcv,  mch,  mchc,  rdw,  neutrabs,  lymphsabs,  monoabs,  eosabs,  basosabs     BNP No results found for this basename: probnp    Lipid Panel  No results  found for this basename: chol,  trig,  hdl,  cholhdl,  vldl,  ldlcalc     RADIOLOGY: No results found.    ASSESSMENT AND PLAN:   William [name] is now 7 years status post a redo revascularization surgery x4. His nuclear perfusion study in December 2013 continued to show fairly normal perfusion without scar or ischemia. He does have mixed hyperlipidemia and seems to be tolerating combination therapy with Niaspan 2000 mg and Crestor 20 mg without side effects. Last  February 2014 cholesterol is 128 triglycerides 205 HDL still remains low at 30 LDL was 57. VLDL was minimally increased at 41. I long discussion with him regarding the importance of additional weight loss and dietary adherence. Review of his chart indicates a 53 pounds weight gain since he completed cardiac rehabilitation program in 2008. Presently, his blood pressure is well-controlled. He he denies myalgias. His hematuria has resolved. He was noted  to have a 3.5 centimeter infrarenal abdominal aortic aneurysm noted on CT imaging. In the future this will need to be followed up with periodic ultrasound evaluations. I am recommending to please her laboratory rechecked in the fasting state and also check a yearly MR study on his current regimen. I will contact him regarding the results and if changes need to image his medical regimen. I will see him in 6 months for cardiology reevaluation.   Troy Sine, MD, [facility]  03/19/2013 2:50 PM

## 2013-03-19 NOTE — Patient Instructions (Signed)
Your physician has recommended you make the following change in your medication: decrease the aspirin to 81 mg from 316m  Your physician recommends that you schedule a follow-up appointment in: 6 MONTHS..[name] Your physician recommends that you return for lab work in fasting.

## 2013-03-21 LAB — COMPREHENSIVE METABOLIC PANEL
ALBUMIN: 4.3 g/dL (ref 3.5–5.2)
ALK PHOS: 77 U/L (ref 39–117)
ALT: 35 U/L (ref 0–53)
AST: 43 U/L — AB (ref 0–37)
BUN: 10 mg/dL (ref 6–23)
CO2: 24 mEq/L (ref 19–32)
Calcium: 9.7 mg/dL (ref 8.4–10.5)
Chloride: 100 mEq/L (ref 96–112)
Creat: 0.92 mg/dL (ref 0.50–1.35)
Glucose, Bld: 122 mg/dL — ABNORMAL HIGH (ref 70–99)
POTASSIUM: 4 meq/L (ref 3.5–5.3)
Sodium: 137 mEq/L (ref 135–145)
Total Bilirubin: 2.1 mg/dL — ABNORMAL HIGH (ref 0.2–1.2)
Total Protein: 6.9 g/dL (ref 6.0–8.3)

## 2013-03-21 LAB — CBC
HCT: 43.4 % (ref 39.0–52.0)
Hemoglobin: 15 g/dL (ref 13.0–17.0)
MCH: 30.5 pg (ref 26.0–34.0)
MCHC: 34.6 g/dL (ref 30.0–36.0)
MCV: 88.2 fL (ref 78.0–100.0)
PLATELETS: 198 10*3/uL (ref 150–400)
RBC: 4.92 MIL/uL (ref 4.22–5.81)
RDW: 15 % (ref 11.5–15.5)
WBC: 6.9 10*3/uL (ref 4.0–10.5)

## 2013-03-21 LAB — NMR LIPOPROFILE WITH LIPIDS
CHOLESTEROL, TOTAL: 119 mg/dL (ref <200)
HDL Particle Number: 28.9 umol/L — ABNORMAL LOW (ref >=30.5)
HDL Size: 8.9 nm — ABNORMAL LOW (ref >=9.2)
HDL-C: 38 mg/dL — AB (ref >=40)
LDL CALC: 54 mg/dL (ref <100)
LDL PARTICLE NUMBER: 1081 nmol/L — AB (ref <1000)
LDL Size: 20 nm — ABNORMAL LOW (ref >20.5)
LP-IR SCORE: 43 (ref <=45)
Large HDL-P: 2.2 umol/L — ABNORMAL LOW (ref >=4.8)
Large VLDL-P: 1.4 nmol/L (ref <=2.7)
Small LDL Particle Number: 683 nmol/L — ABNORMAL HIGH (ref <=527)
TRIGLYCERIDES: 137 mg/dL (ref <150)
VLDL SIZE: 42.4 nm (ref <=46.6)

## 2013-03-21 LAB — HEMOGLOBIN A1C
HEMOGLOBIN A1C: 6.7 % — AB (ref <5.7)
MEAN PLASMA GLUCOSE: 146 mg/dL — AB (ref <117)

## 2013-03-21 LAB — TSH: TSH: 1.963 u[IU]/mL (ref 0.350–4.500)

## 2013-05-07 ENCOUNTER — Other Ambulatory Visit: Payer: Self-pay | Admitting: Cardiovascular Disease

## 2013-05-07 NOTE — Telephone Encounter (Signed)
Rx was sent to pharmacy electronically. 

## 2013-09-03 ENCOUNTER — Other Ambulatory Visit: Payer: Self-pay | Admitting: Cardiovascular Disease

## 2013-09-03 NOTE — Telephone Encounter (Signed)
Rx was sent to pharmacy electronically. 

## 2013-09-10 ENCOUNTER — Other Ambulatory Visit: Payer: Self-pay | Admitting: Cardiovascular Disease

## 2013-09-10 NOTE — Telephone Encounter (Signed)
Rx was sent to pharmacy electronically. 

## 2013-10-10 ENCOUNTER — Other Ambulatory Visit: Payer: Self-pay | Admitting: Cardiovascular Disease

## 2013-10-10 NOTE — Telephone Encounter (Signed)
Rx was sent to pharmacy electronically. 

## 2013-11-15 ENCOUNTER — Ambulatory Visit (INDEPENDENT_AMBULATORY_CARE_PROVIDER_SITE_OTHER): Payer: 59 | Admitting: Cardiovascular Disease

## 2013-11-15 ENCOUNTER — Encounter: Payer: Self-pay | Admitting: Cardiovascular Disease

## 2013-11-15 VITALS — BP 150/90 | HR 71 | Ht 69.0 in | Wt 261.0 lb

## 2013-11-15 DIAGNOSIS — R5383 Other fatigue: Secondary | ICD-10-CM

## 2013-11-15 DIAGNOSIS — I2510 Atherosclerotic heart disease of native coronary artery without angina pectoris: Secondary | ICD-10-CM

## 2013-11-15 DIAGNOSIS — E782 Mixed hyperlipidemia: Secondary | ICD-10-CM

## 2013-11-15 DIAGNOSIS — E668 Other obesity: Secondary | ICD-10-CM | POA: Insufficient documentation

## 2013-11-15 DIAGNOSIS — I714 Abdominal aortic aneurysm, without rupture, unspecified: Secondary | ICD-10-CM

## 2013-11-15 DIAGNOSIS — I10 Essential (primary) hypertension: Secondary | ICD-10-CM

## 2013-11-15 DIAGNOSIS — Z79899 Other long term (current) drug therapy: Secondary | ICD-10-CM

## 2013-11-15 DIAGNOSIS — E785 Hyperlipidemia, unspecified: Secondary | ICD-10-CM

## 2013-11-15 DIAGNOSIS — E8881 Metabolic syndrome: Secondary | ICD-10-CM

## 2013-11-15 NOTE — Patient Instructions (Signed)
Your physician recommends that you schedule a follow-up appointment in: May 2016  Your physician recommends that you return for lab work in: [place] with lipids, CMP, TSH, CBC  Your physician has requested that you have an abdominal aorta duplex. During this test, an ultrasound is used to evaluate the aorta. Allow 30 minutes for this exam. Do not eat after midnight the day before and avoid carbonated beverages 6 Months

## 2013-11-15 NOTE — Progress Notes (Signed)
Patient ID: CONNELLY NETTERVILLE, male   DOB: 1950-02-19, 63 y.o.   MRN: [id number]      HPI: William [name], is a 63 y.o. male who presents to the office today for an 42-monthcardiology evaluation.  Mr. SDecesarehas known coronary artery disease and underwent CABG revascularization surgery with a LIMA to the LAD, SVG to the RCA, SVG to the circumflex vessel in February 2008.  He has a history of obesity, metabolic syndrome, and mixed hyperlipidemia with preponderance of small dense LDL some particles and increased TG for which he has been on combination therapy.  In December 2013 a nuclear perfusion study was unchanged from 2 years previously and did not show significant scar or ischemia. In January 2014,  I adjusted his antihypertensive medications and started  Benicar HCT 40/25 for optimal blood pressure control with increase diuretic regimen and took him off his Avalide. He has felt improved on this therapy.  According to his wife, his blood pressure at home typically is in the 120s systolially and in the 714Ddiastolically.  Since I last saw him 8 months ago, and he has lost approximately 8 pounds.  He does not routinely exercise.  He also does note some dietary discretion. He has noticed some mild leg swelling intermittently. He denies recent anginal symptoms. He denies palpitations. He presents for evaluation.  Earlier this year.  He underwent a urologic evaluation for gross hematuria. A CT of his abdomen and pelvis did not reveal any evidence for renal calculi or hydronephrosis or evidence for renal calculi or dilatation. He did have small benign renal cysts. He was also noted a 3.5 cm infrarenal abdominal aortic aneurysm. There was also a suggestion of fatty infiltration of his liver.  Over the past 8 months, he denies chest tightness.  He denies shortness of breath.  He has not had recent lab work.  He presents for evaluation.  Past Medical History  Diagnosis Date  . Hypertension   .  Hyperlipidemia     Past Surgical History  Procedure Laterality Date  . Cardiac catheterization    . Coronary artery bypass graft    . Coronary angioplasty      No Known Allergies  Current Outpatient Prescriptions  Medication Sig Dispense Refill  . aspirin 81 MG tablet Take 81 mg by mouth daily.      .Marland KitchenBENICAR HCT 40-25 MG per tablet TAKE 1 TABLET BY MOUTH ONCE DAILY  90 tablet  1  . cholecalciferol (VITAMIN D) 1000 UNITS tablet Take 1,000 Units by mouth daily.      . cloNIDine (CATAPRES) 0.1 MG tablet TAKE 1 TABLET BY MOUTH TWICE DAILY  180 tablet  1  . CRESTOR 20 MG tablet TAKE 1 TABLET BY MOUTH AT BEDTIME  90 tablet  1  . metoprolol (LOPRESSOR) 100 MG tablet TAKE 1 TABLET BY MOUTH TWICE DAILY  180 tablet  3  . niacin (NIASPAN) 1000 MG CR tablet TAKE 2 TABLETS BY MOUTH AT BEDTIME  180 tablet  1  . omega-3 acid ethyl esters (LOVAZA) 1 G capsule TAKE 2 CAPSULES BY MOUTH ONCE DAILY  180 capsule  1   No current facility-administered medications for this visit.    Socially he's married has 3 children 3 grandchildren. Does not routinely exercise. In the past he had been walking 4 miles on a treadmill but has not done this in some time. There is no tobacco or alcohol use.  ROS General: Negative; No fevers, chills, or  night sweats;  HEENT: Negative; No changes in vision or hearing, sinus congestion, difficulty swallowing Pulmonary: Negative; No cough, wheezing, shortness of breath, hemoptysis Cardiovascular: Negative; No chest pain, presyncope, syncope, palpitations Positive for intermittent mild leg swelling, which has significantly improved with the diuretic regimen GI: Negative; No nausea, vomiting, diarrhea, or abdominal pain GU: Negative; No dysuria, hematuria, or difficulty voiding Musculoskeletal: Negative; no myalgias, joint pain, or weakness Hematologic/Oncology: Negative; no easy bruising, bleeding Endocrine: Positive for metabolic syndrome no heat/cold intolerance; no overt  diabetes Neuro: Negative; no changes in balance, headaches Skin: Negative; No rashes or skin lesions Psychiatric: Negative; No behavioral problems, depression Sleep: Negative; No snoring, daytime sleepiness, hypersomnolence, bruxism, restless legs, hypnogognic hallucinations, no cataplexy Other comprehensive 14 point system review is negative.   PE BP 150/90  Pulse 71  Ht 5' 9"  (1.753 m)  Wt 261 lb (118.389 kg)  BMI 38.53 kg/m2  Repeat blood pressure by me 122/70. General: Alert, oriented, no distress.  Skin: normal turgor, no rashes HEENT: Normocephalic, atraumatic. Pupils round and reactive; sclera anicteric;no lid lag. No xanthelasmas. No lid lag. Nose without nasal septal hypertrophy Mouth/Parynx benign; Mallinpatti scale 3 Neck: Thick neck; No JVD, no carotid bruits normal carotid upstroke. Lungs: clear to ausculatation and percussion; no wheezing or rales Heart: RRR, s1 s2 normal 1/6 systolic murmur. Chest wall: Nontender to palpation Abdomen: Moderately severe abdominal obesity. soft, nontender; no hepatosplenomehaly, BS+; abdominal aorta nontender and not dilated by palpation. Back: No CVA tenderness Pulses 2+ Extremities: Trivial pretibial edema bilaterally above the sock line; no clubbing cyanosis, Homan's sign negative  Neurologic: grossly nonfocal Psychological: Normal affect and mood  ECG (independently read by me): Sinus rhythm with first-degree AV block.  Incomplete right bundle branch block.  QTc interval 447 ms; first-degree AV block with a PR interval of 220 ms   03/19/2013 ECG (independently read by me): Normal sinus rhythm at 69 beats per minute. No ectopy. Borderline first-degree block with a PR interval of 216 ms.  Prior ECG of July 18,2014:  Sinus rhythm at 57 beats per minute. Borderline first-degree block with a period of 1214 ms. No significant ST abnormalities.  LABS:  BMET    Component Value Date/Time   NA 137 03/19/2013 0846     Hepatic  Function Panel     Component Value Date/Time   PROT 6.9 03/19/2013 0846     CBC    Component Value Date/Time   WBC 6.9 03/19/2013 0846     BNP No results found for this basename: probnp    Lipid Panel  No results found for this basename: chol,  trig,  hdl,  cholhdl,  vldl,  ldlcalc     RADIOLOGY: No results found.    ASSESSMENT AND PLAN:   Mr. Covin is a 63 year old gentleman with a history of moderate obesity, who is almost 8 years status post  CABG revascularization surgery x4. His nuclear perfusion study in December 2013 continued to show fairly normal perfusion without scar or ischemia. He does have significant mixed hyperlipidemia in the past has been documented to have a preponderance of small dense LDL particles and significantly increased triglycerides.  He has been tolerating combination therapy with Niaspan 2000 mg and Crestor 20 mg without side effects.  His blood pressure today is stable on Benicar HCT 40/25 in addition to clonidine 0.1 mg twice a day, as well as metoprolol 100 mg twice a day.  I am checking laboratory in a fasting state, consisting of a comprehensive  metabolic panel, CBC, and NMR lipoprofile , and TSH.  He has documented small simple cysts in his kidneys.  He is also been found to have a small abdominal aortic aneurysm, measuring 3.5 cm noted on CT imaging in January 2015.  I will contact him regarding blood work results and adjustments to his medications will be made if necessary.  In 6 months, I'm recommending that he undergo a 1-1/2 year followup assessment of his abdominal aortic aneurysm, and we'll schedule him for an abdominal aortic Doppler study.  I will see him back in the office for further evaluation.  I discussed weight loss and increased exercise.  He is moderately obese and has gained in excess of 50 pounds since cardiac rehabilitation post CABG surgery.   Troy Sine, MD, [facility]  11/15/2013 3:20 PM

## 2013-11-30 LAB — COMPREHENSIVE METABOLIC PANEL
ALT: 41 U/L (ref 0–53)
AST: 44 U/L — ABNORMAL HIGH (ref 0–37)
Albumin: 4.3 g/dL (ref 3.5–5.2)
Alkaline Phosphatase: 80 U/L (ref 39–117)
BILIRUBIN TOTAL: 2.5 mg/dL — AB (ref 0.2–1.2)
BUN: 10 mg/dL (ref 6–23)
CHLORIDE: 99 meq/L (ref 96–112)
CO2: 30 meq/L (ref 19–32)
CREATININE: 0.95 mg/dL (ref 0.50–1.35)
Calcium: 9.6 mg/dL (ref 8.4–10.5)
GLUCOSE: 114 mg/dL — AB (ref 70–99)
Potassium: 4.3 mEq/L (ref 3.5–5.3)
Sodium: 138 mEq/L (ref 135–145)
Total Protein: 6.8 g/dL (ref 6.0–8.3)

## 2013-11-30 LAB — CBC
HEMATOCRIT: 45.7 % (ref 39.0–52.0)
Hemoglobin: 15.2 g/dL (ref 13.0–17.0)
MCH: 30.4 pg (ref 26.0–34.0)
MCHC: 33.3 g/dL (ref 30.0–36.0)
MCV: 91.4 fL (ref 78.0–100.0)
Platelets: 209 10*3/uL (ref 150–400)
RBC: 5 MIL/uL (ref 4.22–5.81)
RDW: 14.1 % (ref 11.5–15.5)
WBC: 9.5 10*3/uL (ref 4.0–10.5)

## 2013-11-30 LAB — TSH: TSH: 2.434 u[IU]/mL (ref 0.350–4.500)

## 2013-12-02 LAB — NMR LIPOPROFILE WITH LIPIDS
Cholesterol, Total: 135 mg/dL (ref 100–199)
HDL PARTICLE NUMBER: 25.2 umol/L — AB (ref >=30.5)
HDL SIZE: 8.7 nm — AB (ref >=9.2)
HDL-C: 34 mg/dL — AB (ref >39)
LDL (calc): 66 mg/dL (ref 0–99)
LDL Particle Number: 1354 nmol/L — ABNORMAL HIGH (ref <1000)
LDL Size: 20.4 nm (ref >=20.8)
LP-IR SCORE: 67 — AB (ref <=45)
Large HDL-P: 2.1 umol/L — ABNORMAL LOW (ref >=4.8)
Large VLDL-P: 2.8 nmol/L — ABNORMAL HIGH (ref <=2.7)
Small LDL Particle Number: 816 nmol/L — ABNORMAL HIGH (ref <=527)
Triglycerides: 177 mg/dL — ABNORMAL HIGH (ref 0–149)
VLDL Size: 49 nm — ABNORMAL HIGH (ref <=46.6)

## 2013-12-20 ENCOUNTER — Telehealth: Payer: Self-pay | Admitting: *Deleted

## 2013-12-20 NOTE — Telephone Encounter (Signed)
-----   Message from Troy Sine, MD sent at 12/16/2013  6:46 PM EST ----- LDL particle increased. AST minimally elevated therefore will not increase statin; improve diet with incTG

## 2013-12-20 NOTE — Telephone Encounter (Signed)
Called and notified patient of lab results and recommendations. Patient voiced understanding.

## 2014-01-14 ENCOUNTER — Telehealth: Payer: Self-pay | Admitting: Family

## 2014-01-14 DIAGNOSIS — J019 Acute sinusitis, unspecified: Secondary | ICD-10-CM

## 2014-01-14 MED ORDER — AMOXICILLIN-POT CLAVULANATE 875-125 MG PO TABS: 1.0000 | ORAL_TABLET | Freq: Two times a day (BID) | ORAL | Status: DC

## 2014-01-14 NOTE — Progress Notes (Signed)
We are sorry that you are not feeling well.  Here is how we plan to help!  Based on what you have shared with me it looks like you have sinusitis.  Sinusitis is inflammation and infection in the sinus cavities of the head.  Based on your presentation I believe you most likely have Acute Bacterial sinusitis.  This is an infection caused by bacteria and is treated with antibiotics.  I have prescribed Augmentin, an antibiotic in the penicillin family, one tablet twice daily with food, for 7 days.  You may use an oral decongestant such as Mucinex D or if you have glaucoma or high blood pressure use plain Mucinex.  Saline nasal sprays help an can sefely be used as often as needed for congestion.  If you develop worsening sinus pain, fever or notice severe headache and vision changes, or if symptoms are not better after completion of antibiotic, please schedule an appointment with a health care provider.  Sinus infections are not as easily transmitted as other respiratory infection, however we still recommend that you avoid close contact with loved ones, especially the very young and elderly.  Remember to wash your hands thoroughly throughout the day as this is the number one way to prevent the spread of infection!  Home Care:  Only take medications as instructed by your medical team.  Complete the entire course of an antibiotic.  Do not take these medications with alcohol.  A steam or ultrasonic humidifier can help congestion.  You can place a towel over your head and breathe in the steam from hot water coming from a faucet.  Avoid close contacts especially the very young and the elderly.  Cover your mouth when you cough or sneeze.  Always remember to wash your hands.  Get Help Right Away If:  You develop worsening fever or sinus pain.  You develop a severe head ache or visual changes.  Your symptoms persist after you have completed your treatment plan.  Make sure you  Understand these  instructions.  Will watch your condition.  Will get help right away if you are not doing well or get worse.  Your e-visit answers were reviewed by a board certified advanced clinical practitioner to complete your personal care plan.  Depending on the condition, your plan could have included both over the counter or prescription medications.  Please review your pharmacy choice.  If there is a problem, you may call our nursing hot line at [phone] and have the prescription routed to another pharmacy.  Your safety is important to us.  If you have drug allergies check your prescription carefully.    You can use MyChart to ask questions about today's visit, request a non-urgent call back, or ask for a work or school excuse.  You will get an e-mail in the next two days asking about your experience.  I hope that your e-visit has been valuable and will speed your recovery. Thank you for using e-visits.    

## 2014-03-05 ENCOUNTER — Other Ambulatory Visit: Payer: Self-pay | Admitting: Cardiovascular Disease

## 2014-03-05 NOTE — Telephone Encounter (Signed)
Rx(s) sent to pharmacy electronically.  

## 2014-03-10 DIAGNOSIS — I714 Abdominal aortic aneurysm, without rupture, unspecified: Secondary | ICD-10-CM

## 2014-03-10 HISTORY — DX: Abdominal aortic aneurysm, without rupture: I71.4

## 2014-03-10 HISTORY — DX: Abdominal aortic aneurysm, without rupture, unspecified: I71.40

## 2014-03-18 ENCOUNTER — Other Ambulatory Visit: Payer: Self-pay | Admitting: Cardiovascular Disease

## 2014-03-18 NOTE — Telephone Encounter (Signed)
Rx(s) sent to pharmacy electronically.  

## 2014-04-01 ENCOUNTER — Other Ambulatory Visit: Payer: Self-pay | Admitting: Cardiovascular Disease

## 2014-04-01 NOTE — Telephone Encounter (Signed)
Rx has been sent to the pharmacy electronically. ° °

## 2014-05-13 ENCOUNTER — Other Ambulatory Visit: Payer: Self-pay | Admitting: Cardiovascular Disease

## 2014-05-13 NOTE — Telephone Encounter (Signed)
Rx(s) sent to pharmacy electronically.  

## 2014-05-20 ENCOUNTER — Other Ambulatory Visit: Payer: Self-pay | Admitting: Cardiovascular Disease

## 2014-05-20 NOTE — Telephone Encounter (Signed)
Rx(s) sent to pharmacy electronically.  

## 2014-06-27 ENCOUNTER — Encounter (HOSPITAL_COMMUNITY): Payer: 59

## 2014-06-27 ENCOUNTER — Ambulatory Visit (HOSPITAL_COMMUNITY)
Admission: RE | Admit: 2014-06-27 | Discharge: 2014-06-27 | Disposition: A | Discharge status: Home / Self Care | Payer: 59 | Source: Ambulatory Visit | Attending: Cardiology | Admitting: Cardiology

## 2014-06-27 DIAGNOSIS — I714 Abdominal aortic aneurysm, without rupture, unspecified: Secondary | ICD-10-CM

## 2014-06-27 DIAGNOSIS — Z951 Presence of aortocoronary bypass graft: Secondary | ICD-10-CM | POA: Insufficient documentation

## 2014-06-27 NOTE — Progress Notes (Signed)
Abdominal Aorta Duplex Completed. Oda Cogan, BS, RDMS, RVT

## 2014-07-09 ENCOUNTER — Other Ambulatory Visit: Payer: Self-pay | Admitting: Cardiovascular Disease

## 2014-07-09 DIAGNOSIS — E118 Type 2 diabetes mellitus with unspecified complications: Secondary | ICD-10-CM

## 2014-07-09 HISTORY — DX: Type 2 diabetes mellitus with unspecified complications: E11.8

## 2014-07-15 ENCOUNTER — Encounter: Payer: Self-pay | Admitting: Cardiovascular Disease

## 2014-07-15 ENCOUNTER — Ambulatory Visit (INDEPENDENT_AMBULATORY_CARE_PROVIDER_SITE_OTHER): Payer: 59 | Admitting: Cardiovascular Disease

## 2014-07-15 VITALS — BP 124/82 | HR 66 | Ht 69.0 in | Wt 261.8 lb

## 2014-07-15 DIAGNOSIS — E668 Other obesity: Secondary | ICD-10-CM

## 2014-07-15 DIAGNOSIS — I10 Essential (primary) hypertension: Secondary | ICD-10-CM

## 2014-07-15 DIAGNOSIS — E782 Mixed hyperlipidemia: Secondary | ICD-10-CM | POA: Diagnosis not present

## 2014-07-15 DIAGNOSIS — I714 Abdominal aortic aneurysm, without rupture, unspecified: Secondary | ICD-10-CM

## 2014-07-15 DIAGNOSIS — I25810 Atherosclerosis of coronary artery bypass graft(s) without angina pectoris: Secondary | ICD-10-CM | POA: Diagnosis not present

## 2014-07-15 DIAGNOSIS — Z79899 Other long term (current) drug therapy: Secondary | ICD-10-CM | POA: Diagnosis not present

## 2014-07-15 DIAGNOSIS — I2510 Atherosclerotic heart disease of native coronary artery without angina pectoris: Secondary | ICD-10-CM

## 2014-07-15 DIAGNOSIS — E8881 Metabolic syndrome: Secondary | ICD-10-CM

## 2014-07-15 LAB — CBC
HCT: 43.8 % (ref 39.0–52.0)
Hemoglobin: 15 g/dL (ref 13.0–17.0)
MCH: 29.9 pg (ref 26.0–34.0)
MCHC: 34.2 g/dL (ref 30.0–36.0)
MCV: 87.4 fL (ref 78.0–100.0)
MPV: 11.3 fL (ref 8.6–12.4)
PLATELETS: 180 10*3/uL (ref 150–400)
RBC: 5.01 MIL/uL (ref 4.22–5.81)
RDW: 15.2 % (ref 11.5–15.5)
WBC: 8.6 10*3/uL (ref 4.0–10.5)

## 2014-07-15 LAB — COMPREHENSIVE METABOLIC PANEL
ALBUMIN: 3.9 g/dL (ref 3.5–5.2)
ALK PHOS: 92 U/L (ref 39–117)
ALT: 47 U/L (ref 0–53)
AST: 58 U/L — AB (ref 0–37)
BUN: 8 mg/dL (ref 6–23)
CHLORIDE: 100 meq/L (ref 96–112)
CO2: 28 meq/L (ref 19–32)
Calcium: 9.6 mg/dL (ref 8.4–10.5)
Creat: 0.96 mg/dL (ref 0.50–1.35)
Glucose, Bld: 104 mg/dL — ABNORMAL HIGH (ref 70–99)
POTASSIUM: 4.5 meq/L (ref 3.5–5.3)
SODIUM: 136 meq/L (ref 135–145)
Total Bilirubin: 3.3 mg/dL — ABNORMAL HIGH (ref 0.2–1.2)
Total Protein: 7.3 g/dL (ref 6.0–8.3)

## 2014-07-15 LAB — TSH: TSH: 2.869 u[IU]/mL (ref 0.350–4.500)

## 2014-07-15 LAB — LIPID PANEL
CHOLESTEROL: 139 mg/dL (ref 0–200)
HDL: 27 mg/dL — AB (ref >=40)
LDL Cholesterol: 77 mg/dL (ref 0–99)
Total CHOL/HDL Ratio: 5.1 Ratio
Triglycerides: 176 mg/dL — ABNORMAL HIGH (ref <150)
VLDL: 35 mg/dL (ref 0–40)

## 2014-07-15 LAB — HEMOGLOBIN A1C
Hgb A1c MFr Bld: 6.8 % — ABNORMAL HIGH (ref <5.7)
Mean Plasma Glucose: 148 mg/dL — ABNORMAL HIGH (ref <117)

## 2014-07-15 NOTE — Patient Instructions (Signed)
Your physician recommends that you return for lab work fasting.  Your physician wants you to follow-up in: 6 months or sooner if needed. You will receive a reminder letter in the mail two months in advance. If you don't receive a letter, please call our office to schedule the follow-up appointment.

## 2014-07-15 NOTE — Progress Notes (Signed)
Patient ID: William [name], male   DOB: 1950/08/22, 64 y.o.   MRN: [id number]    HPI: William [name] is a 64 y.o. male who presents to the office today for an 56-monthcardiology evaluation.  Mr. SPortillahas known CAD and underwent CABG revascularization surgery with a LIMA to the LAD, SVG to the RCA, SVG to the circumflex vessel in February 2008.  He has a history of obesity, metabolic syndrome, and mixed hyperlipidemia with preponderance of small dense LDL some particles and increased TG for which he has been on combination therapy.  In December 2013 a nuclear perfusion study was unchanged from 2 years previously and did not show significant scar or ischemia. In January 2014,  I adjusted his antihypertensive medications and started  Benicar HCT 40/25 for optimal blood pressure control with increase diuretic regimen and took him off his Avalide. He has felt improved on this therapy.  According to his wife, his blood pressure at home typically is in the 120s systolially and in the 776Ldiastolically.  He has a history of moderate obesity.  Last year he had lost 8 pounds.  He does not routinely exercise.  He also does note some dietary discretion. He has noticed some mild leg swelling intermittently. He denies recent anginal symptoms. He denies palpitations.   Last year he underwent a urologic evaluation for gross hematuria. A CT of his abdomen and pelvis did not reveal any evidence for renal calculi or hydronephrosis or evidence for renal calculi or dilatation. He did have small benign renal cysts. He was also noted a 3.5 cm infrarenal abdominal aortic aneurysm. There was also a suggestion of fatty infiltration of his liver.  He underwent a follow-up abdominal aortic ultrasound on 06/27/2014.  This showed his distal aorta measuring 3.0 x 3.1 cm and he had mild amount of atherosclerosis.  Over the past 8 months, he denies chest tightness.  He denies shortness of breath.  He has not had recent lab work.  He  presents for evaluation.  Past Medical History  Diagnosis Date  . Hypertension   . Hyperlipidemia     Past Surgical History  Procedure Laterality Date  . Cardiac catheterization    . Coronary artery bypass graft    . Coronary angioplasty      No Known Allergies  Current Outpatient Prescriptions  Medication Sig Dispense Refill  . aspirin 81 MG tablet Take 81 mg by mouth daily.    .Marland KitchenBENICAR HCT 40-25 MG per tablet TAKE 1 TABLET BY MOUTH ONCE DAILY 90 tablet 2  . cholecalciferol (VITAMIN D) 1000 UNITS tablet Take 1,000 Units by mouth daily.    . cloNIDine (CATAPRES) 0.1 MG tablet Take 1 tablet (0.1 mg total) by mouth 2 (two) times daily. 180 tablet 2  . CRESTOR 20 MG tablet TAKE 1 TABLET BY MOUTH AT BEDTIME 90 tablet 1  . metoprolol (LOPRESSOR) 100 MG tablet TAKE 1 TABLET BY MOUTH TWICE DAILY 180 tablet 1  . niacin (NIASPAN) 1000 MG CR tablet TAKE 2 TABLETS BY MOUTH AT BEDTIME 180 tablet 3  . omega-3 acid ethyl esters (LOVAZA) 1 G capsule Take 2 capsules (2 g total) by mouth daily. 180 capsule 2   No current facility-administered medications for this visit.    Socially he's married has 3 children 3 grandchildren. Does not routinely exercise. In the past he had been walking 4 miles on a treadmill but has not done this in some time. There is no tobacco or  alcohol use.  ROS General: Negative; No fevers, chills, or night sweats;  HEENT: Negative; No changes in vision or hearing, sinus congestion, difficulty swallowing Pulmonary: Negative; No cough, wheezing, shortness of breath, hemoptysis Cardiovascular: Negative; No chest pain, presyncope, syncope, palpitations Positive for intermittent mild leg swelling, which has significantly improved with the diuretic regimen GI: Negative; No nausea, vomiting, diarrhea, or abdominal pain GU: Negative; No dysuria, hematuria, or difficulty voiding Musculoskeletal: Negative; no myalgias, joint pain, or weakness Hematologic/Oncology: Negative; no  easy bruising, bleeding Endocrine: Positive for metabolic syndrome no heat/cold intolerance; no overt diabetes Neuro: Negative; no changes in balance, headaches Skin: Negative; No rashes or skin lesions Psychiatric: Negative; No behavioral problems, depression Sleep: Negative; No snoring, daytime sleepiness, hypersomnolence, bruxism, restless legs, hypnogognic hallucinations, no cataplexy Other comprehensive 14 point system review is negative.   PE BP 124/82 mmHg  Pulse 66  Ht 5' 9"  (1.753 m)  Wt 261 lb 12.8 oz (118.752 kg)  BMI 38.64 kg/m2  Wt Readings from Last 3 Encounters:  07/15/14 261 lb 12.8 oz (118.752 kg)  11/15/13 261 lb (118.389 kg)  03/19/13 269 lb 14.4 oz (122.426 kg)   General: Alert, oriented, no distress.  Skin: normal turgor, no rashes HEENT: Normocephalic, atraumatic. Pupils round and reactive; sclera anicteric;no lid lag. No xanthelasmas. No lid lag. Nose without nasal septal hypertrophy Mouth/Parynx benign; Mallinpatti scale 3 Neck: Thick neck; No JVD, no carotid bruits normal carotid upstroke. Lungs: clear to ausculatation and percussion; no wheezing or rales Heart: RRR, s1 s2 normal 1/6 systolic murmur. Chest wall: Nontender to palpation Abdomen: Moderately severe abdominal obesity. soft, nontender; no hepatosplenomehaly, BS+; abdominal aorta nontender and not dilated by palpation. Back: No CVA tenderness Pulses 2+ Extremities: Trivial pretibial edema bilaterally above the sock line; no clubbing cyanosis, Homan's sign negative  Neurologic: grossly nonfocal Psychological: Normal affect and mood  ECG (independently read by me): Normal sinus rhythm at 66. First-degree block with PR interval 28 ms.  Incomplete right bundle branch block.  QRS complex V1-3.   ECG (independently read by me): Sinus rhythm with first-degree AV block.  Incomplete right bundle branch block.  QTc interval 447 ms; first-degree AV block with a PR interval of 220 ms   03/19/2013 ECG  (independently read by me): Normal sinus rhythm at 69 beats per minute. No ectopy. Borderline first-degree block with a PR interval of 216 ms.  Prior ECG of July 18,2014:  Sinus rhythm at 57 beats per minute. Borderline first-degree block with a period of 1214 ms. No significant ST abnormalities.  LABS: BMP Latest Ref Rng 11/29/2013 03/19/2013  Glucose 70 - 99 mg/dL 114(H) 122(H)  BUN 6 - 23 mg/dL 10 10  Creatinine 0.50 - 1.35 mg/dL 0.95 0.92  Sodium 135 - 145 mEq/L 138 137  Potassium 3.5 - 5.3 mEq/L 4.3 4.0  Chloride 96 - 112 mEq/L 99 100  CO2 19 - 32 mEq/L 30 24  Calcium 8.4 - 10.5 mg/dL 9.6 9.7   Hepatic Function Latest Ref Rng 11/29/2013 03/19/2013  Total Protein 6.0 - 8.3 g/dL 6.8 6.9  Albumin 3.5 - 5.2 g/dL 4.3 4.3  AST 0 - 37 U/L 44(H) 43(H)  ALT 0 - 53 U/L 41 35  Alk Phosphatase 39 - 117 U/L 80 77  Total Bilirubin 0.2 - 1.2 mg/dL 2.5(H) 2.1(H)   CBC Latest Ref Rng 11/29/2013 03/19/2013  WBC 4.0 - 10.5 K/uL 9.5 6.9  Hemoglobin 13.0 - 17.0 g/dL 15.2 15.0  Hematocrit 39.0 - 52.0 % 45.7 43.4  Platelets 150 - 400 K/uL 209 198   Lab Results  Component Value Date   MCV 91.4 11/29/2013   MCV 88.2 03/19/2013   Lab Results  Component Value Date   TSH 2.434 11/29/2013   Lab Results  Component Value Date   HGBA1C 6.7* 03/19/2013   Lipid Panel     Component Value Date/Time   CHOL 135 11/29/2013 0940   TRIG 177* 11/29/2013 0940   HDL 34* 11/29/2013 0940   LDLCALC 66 11/29/2013 0940     RADIOLOGY: No results found.    ASSESSMENT AND PLAN:   Mr. Ingwersen is a 64 year old gentleman with a history of moderate obesity, who is 8 years status post  CABG revascularization surgery x4. His nuclear perfusion study in December 2013 continued to show fairly normal perfusion without scar or ischemia. He does have significant mixed hyperlipidemia in the past has been documented to have a preponderance of small dense LDL particles and significantly increased triglycerides.  He has  been tolerating combination therapy with Niaspan 2000 mg and Crestor 20 mg without side effects.  His blood pressure today is stable on Benicar HCT 40/25 in addition to clonidine 0.1 mg twice a day, as well as metoprolol 100 mg twice a day.  He is fasting today and I will check a complete set of laboratory in the fasting state fasting state, consisting of a comprehensive metabolic panel, CBC, WPV9Y , and TSH.  He has documented small simple cysts in his kidneys.  He is also been found to have a small abdominal aortic aneurysm, measuring 3.5 cm noted on CT imaging in January 2015.  Reviewed his most recent abdominal ultrasound with him in detail.  The present study showed reduced AAA dimensions at 3.03.1.  He is not having any anginal symptoms.  His ECG remained stable.  I discussed the importance of weight loss, particularly with his metabolic syndrome and obesity.  I will see him in the office in 6 months for reevaluation or sooner if necessary.  Time spent: 25 minutes   Troy Sine, MD, [facility]  07/15/2014 2:07 PM

## 2014-07-21 ENCOUNTER — Other Ambulatory Visit: Payer: Self-pay | Admitting: *Deleted

## 2014-07-21 ENCOUNTER — Telehealth: Payer: Self-pay | Admitting: *Deleted

## 2014-07-21 DIAGNOSIS — R945 Abnormal results of liver function studies: Principal | ICD-10-CM

## 2014-07-21 DIAGNOSIS — R7989 Other specified abnormal findings of blood chemistry: Secondary | ICD-10-CM

## 2014-07-21 NOTE — Telephone Encounter (Signed)
-----   Message from Troy Sine, MD sent at 07/20/2014  2:41 PM EDT ----- DC niacin and crestor; f/u LFT; GI eval

## 2014-07-21 NOTE — Telephone Encounter (Signed)
Left message to return a call. (need to discuss lab test.)

## 2014-07-21 NOTE — Telephone Encounter (Signed)
Pt's wife called in stating the the pt does have a GI doctor and he sees Dr. [name]. Please call if you need to   Thanks

## 2014-07-21 NOTE — Telephone Encounter (Signed)
Order changed from Dr. Oletta Lamas to Dr. [name].

## 2014-07-29 ENCOUNTER — Encounter: Payer: Self-pay | Admitting: Cardiovascular Disease

## 2014-08-04 LAB — HEPATIC FUNCTION PANEL
ALBUMIN: 4 g/dL (ref 3.5–5.2)
ALT: 54 U/L — ABNORMAL HIGH (ref 0–53)
AST: 51 U/L — ABNORMAL HIGH (ref 0–37)
Alkaline Phosphatase: 101 U/L (ref 39–117)
Bilirubin, Direct: 0.3 mg/dL (ref 0.0–0.3)
Indirect Bilirubin: 1.4 mg/dL — ABNORMAL HIGH (ref 0.2–1.2)
Total Bilirubin: 1.7 mg/dL — ABNORMAL HIGH (ref 0.2–1.2)
Total Protein: 7 g/dL (ref 6.0–8.3)

## 2014-09-10 HISTORY — PX: COLONOSCOPY W/ POLYPECTOMY: SHX1380

## 2014-09-29 ENCOUNTER — Encounter: Payer: Self-pay | Admitting: Cardiovascular Disease

## 2014-11-03 ENCOUNTER — Encounter: Payer: Self-pay | Admitting: Cardiovascular Disease

## 2014-12-01 ENCOUNTER — Other Ambulatory Visit: Payer: Self-pay | Admitting: Cardiovascular Disease

## 2015-01-14 ENCOUNTER — Other Ambulatory Visit: Payer: Self-pay | Admitting: Cardiovascular Disease

## 2015-01-14 NOTE — Telephone Encounter (Signed)
Rx(s) sent to pharmacy electronically.  

## 2015-01-15 ENCOUNTER — Ambulatory Visit (INDEPENDENT_AMBULATORY_CARE_PROVIDER_SITE_OTHER): Payer: 59 | Admitting: Cardiovascular Disease

## 2015-01-15 VITALS — BP 140/92 | HR 66 | Ht 70.0 in | Wt 257.5 lb

## 2015-01-15 DIAGNOSIS — E782 Mixed hyperlipidemia: Secondary | ICD-10-CM

## 2015-01-15 DIAGNOSIS — I2510 Atherosclerotic heart disease of native coronary artery without angina pectoris: Secondary | ICD-10-CM

## 2015-01-15 DIAGNOSIS — I714 Abdominal aortic aneurysm, without rupture, unspecified: Secondary | ICD-10-CM

## 2015-01-15 DIAGNOSIS — I25810 Atherosclerosis of coronary artery bypass graft(s) without angina pectoris: Secondary | ICD-10-CM | POA: Diagnosis not present

## 2015-01-15 DIAGNOSIS — R945 Abnormal results of liver function studies: Secondary | ICD-10-CM

## 2015-01-15 DIAGNOSIS — E668 Other obesity: Secondary | ICD-10-CM

## 2015-01-15 DIAGNOSIS — I10 Essential (primary) hypertension: Secondary | ICD-10-CM

## 2015-01-15 DIAGNOSIS — R7989 Other specified abnormal findings of blood chemistry: Secondary | ICD-10-CM | POA: Diagnosis not present

## 2015-01-15 DIAGNOSIS — E8881 Metabolic syndrome: Secondary | ICD-10-CM

## 2015-01-15 LAB — CBC
HEMATOCRIT: 45.4 % (ref 39.0–52.0)
HEMOGLOBIN: 15.2 g/dL (ref 13.0–17.0)
MCH: 29.5 pg (ref 26.0–34.0)
MCHC: 33.5 g/dL (ref 30.0–36.0)
MCV: 88.2 fL (ref 78.0–100.0)
MPV: 11.4 fL (ref 8.6–12.4)
Platelets: 181 10*3/uL (ref 150–400)
RBC: 5.15 MIL/uL (ref 4.22–5.81)
RDW: 15.2 % (ref 11.5–15.5)
WBC: 8.3 10*3/uL (ref 4.0–10.5)

## 2015-01-15 NOTE — Patient Instructions (Addendum)
Your physician recommends that you return for lab work   Your physician wants you to follow-up in: 6 months or sooner if needed. You will receive a reminder letter in the mail two months in advance. If you don't receive a letter, please call our office to schedule the follow-up appointment.

## 2015-01-16 LAB — LIPID PANEL
CHOL/HDL RATIO: 8.1 ratio — AB (ref <=5.0)
Cholesterol: 244 mg/dL — ABNORMAL HIGH (ref 125–200)
HDL: 30 mg/dL — AB (ref >=40)
LDL CALC: 184 mg/dL — AB (ref <130)
Triglycerides: 149 mg/dL (ref <150)
VLDL: 30 mg/dL (ref <30)

## 2015-01-16 LAB — COMPREHENSIVE METABOLIC PANEL
ALK PHOS: 88 U/L (ref 40–115)
ALT: 64 U/L — AB (ref 9–46)
AST: 85 U/L — AB (ref 10–35)
Albumin: 3.8 g/dL (ref 3.6–5.1)
BILIRUBIN TOTAL: 2.5 mg/dL — AB (ref 0.2–1.2)
BUN: 13 mg/dL (ref 7–25)
CO2: 29 mmol/L (ref 20–31)
Calcium: 9.5 mg/dL (ref 8.6–10.3)
Chloride: 100 mmol/L (ref 98–110)
Creat: 0.89 mg/dL (ref 0.70–1.25)
GLUCOSE: 100 mg/dL — AB (ref 65–99)
Potassium: 4.3 mmol/L (ref 3.5–5.3)
Sodium: 138 mmol/L (ref 135–146)
Total Protein: 7.2 g/dL (ref 6.1–8.1)

## 2015-01-16 LAB — TSH: TSH: 1.187 u[IU]/mL (ref 0.350–4.500)

## 2015-01-17 ENCOUNTER — Encounter: Payer: Self-pay | Admitting: Cardiovascular Disease

## 2015-01-17 DIAGNOSIS — R945 Abnormal results of liver function studies: Secondary | ICD-10-CM

## 2015-01-17 DIAGNOSIS — R7989 Other specified abnormal findings of blood chemistry: Secondary | ICD-10-CM | POA: Insufficient documentation

## 2015-01-17 NOTE — Progress Notes (Signed)
Patient ID: William [name], male   DOB: 1950-10-06, 64 y.o.   MRN: [id number]    HPI: William [name] is a 64 y.o. male who presents to the office today for an 30-monthcardiology evaluation.  Mr. SDoolyhas known CAD and underwent CABG revascularization surgery with a LIMA to the LAD, SVG to the RCA, SVG to the circumflex vessel in February 2008.  He has a history of obesity, metabolic syndrome, and mixed hyperlipidemia with preponderance of small dense LDL some particles and increased TG for which he has been on combination therapy.  In December 2013 a nuclear perfusion study was unchanged from 2 years previously and did not show significant scar or ischemia. In January 2014,  I adjusted his antihypertensive medications and started  Benicar HCT 40/25 for optimal blood pressure control with increase diuretic regimen and took him off his Avalide. He has felt improved on this therapy.  According to his wife, his blood pressure at home typically is in the 120s systolially and in the 797Qdiastolically.  He has a history of moderate obesity.  He does not routinely exercise.  He also does note some dietary discretion. He has noticed some mild leg swelling intermittently. He denies recent anginal symptoms. He denies palpitations.   Last year he underwent a urologic evaluation for gross hematuria. A CT of his abdomen and pelvis did not reveal any evidence for renal calculi or hydronephrosis or evidence for renal calculi or dilatation. He did have small benign renal cysts. He was also noted a 3.5 cm infrarenal abdominal aortic aneurysm. There was also a suggestion of fatty infiltration of his liver.  He underwent a follow-up abdominal aortic ultrasound on 06/27/2014.  This showed his distal aorta measuring 3.0 x 3.1 cm and he had mild amount of atherosclerosis.  Since I last saw him he denies chest tightness.  He denies shortness of breath.  He underwent a recent colonoscopy by Dr. JTyrone Sageand was found to have  2 benign polyps.  He has been off  Crestor and niacin since earlier this year due to elevation of his liver enzymes. There is family history for liver disease in that his mother died from liver disease and his brother had a liver transplant.  In July, his AST was 51 and ALT was 54.  Subsequent blood work by Dr. MCollene Maresrevealed his AST increased at 87 and ALT 68.  He has not had his LFTs rechecked since September.  He states that he stopped all sodas and is now using garlic as recommended by Dr. MCollene [name]  He presents for follow-up cardiology evaluation.  Past Medical History  Diagnosis Date  . Hypertension   . Hyperlipidemia     Past Surgical History  Procedure Laterality Date  . Cardiac catheterization    . Coronary artery bypass graft    . Coronary angioplasty      No Known Allergies  Current Outpatient Prescriptions  Medication Sig Dispense Refill  . aspirin 81 MG tablet Take 81 mg by mouth daily.    .Marland KitchenBENICAR HCT 40-25 MG tablet TAKE 1 TABLET BY MOUTH ONCE DAILY 90 tablet 1  . cholecalciferol (VITAMIN D) 1000 UNITS tablet Take 1,000 Units by mouth daily.    . cloNIDine (CATAPRES) 0.1 MG tablet TAKE 1 TABLET BY MOUTH 2 TIMES DAILY 180 tablet 2  . Garlic 17341MG CAPS Take 1 capsule by mouth 2 (two) times daily.    . metoprolol (LOPRESSOR) 100 MG tablet TAKE 1  TABLET BY MOUTH TWICE DAILY 180 tablet 1  . omega-3 acid ethyl esters (LOVAZA) 1 G capsule Take 2 capsules (2 g total) by mouth daily. 180 capsule 2   No current facility-administered medications for this visit.    Socially he's married has 3 children 3 grandchildren. Does not routinely exercise. In the past he had been walking 4 miles on a treadmill but has not done this in some time. There is no tobacco or alcohol use.  ROS General: Negative; No fevers, chills, or night sweats;  HEENT: Negative; No changes in vision or hearing, sinus congestion, difficulty swallowing Pulmonary: Negative; No cough, wheezing, shortness of breath,  hemoptysis Cardiovascular: Negative; No chest pain, presyncope, syncope, palpitations Positive for intermittent mild leg swelling, which has significantly improved with the diuretic regimen GI: Negative; No nausea, vomiting, diarrhea, or abdominal pain GU: Negative; No dysuria, hematuria, or difficulty voiding Musculoskeletal: Negative; no myalgias, joint pain, or weakness Hematologic/Oncology: Negative; no easy bruising, bleeding Endocrine: Positive for metabolic syndrome no heat/cold intolerance; no overt diabetes Neuro: Negative; no changes in balance, headaches Skin: Negative; No rashes or skin lesions Psychiatric: Negative; No behavioral problems, depression Sleep: Negative; No snoring, daytime sleepiness, hypersomnolence, bruxism, restless legs, hypnogognic hallucinations, no cataplexy Other comprehensive 14 point system review is negative.   PE BP 140/92 mmHg  Pulse 66  Ht 5' 10"  (1.778 m)  Wt 257 lb 8 oz (116.801 kg)  BMI 36.95 kg/m2  Repeat blood pressure 120/68 when taken by me.  Wt Readings from Last 3 Encounters:  01/15/15 257 lb 8 oz (116.801 kg)  07/15/14 261 lb 12.8 oz (118.752 kg)  11/15/13 261 lb (118.389 kg)   General: Alert, oriented, no distress.  Skin: normal turgor, no rashes HEENT: Normocephalic, atraumatic. Pupils round and reactive; sclera anicteric;no lid lag. No xanthelasmas. No lid lag. Nose without nasal septal hypertrophy Mouth/Parynx benign; Mallinpatti scale 3 Neck: Thick neck; No JVD, no carotid bruits normal carotid upstroke. Lungs: clear to ausculatation and percussion; no wheezing or rales Heart: RRR, s1 s2 normal 1/6 systolic murmur; no S3 or S4 gallop.  No rubs, thrills, or heaves Chest wall: Nontender to palpation Abdomen: Moderately severe abdominal obesity. soft, nontender; no hepatosplenomehaly, BS+; abdominal aorta nontender and not dilated by palpation. Back: No CVA tenderness Pulses 2+ Extremities: Trivial pretibial edema  bilaterally above the sock line; no clubbing cyanosis, Homan's sign negative  Neurologic: grossly nonfocal Psychological: Normal affect and mood  ECG (independently read by me): Normal sinus rhythm at 66 bpm.  First-degree AV block with a PR interval at 224 ms.  June 2016 ECG (independently read by me): Normal sinus rhythm at 66. First-degree block with PR interval 28 ms.  Incomplete right bundle branch block.  QRS complex V1-3.   October 2015 ECG (independently read by me): Sinus rhythm with first-degree AV block.  Incomplete right bundle branch block.  QTc interval 447 ms; first-degree AV block with a PR interval of 220 ms   03/19/2013 ECG (independently read by me): Normal sinus rhythm at 69 beats per minute. No ectopy. Borderline first-degree block with a PR interval of 216 ms.  Prior ECG of July 18,2014:  Sinus rhythm at 57 beats per minute. Borderline first-degree block with a period of 1214 ms. No significant ST abnormalities.  LABS: BMP Latest Ref Rng 01/15/2015 07/15/2014 11/29/2013  Glucose 65 - 99 mg/dL 100(H) 104(H) 114(H)  BUN 7 - 25 mg/dL 13 8 10   Creatinine 0.70 - 1.25 mg/dL 0.89 0.96 0.95  Sodium  135 - 146 mmol/L 138 136 138  Potassium 3.5 - 5.3 mmol/L 4.3 4.5 4.3  Chloride 98 - 110 mmol/L 100 100 99  CO2 20 - 31 mmol/L 29 28 30   Calcium 8.6 - 10.3 mg/dL 9.5 9.6 9.6   Hepatic Function Latest Ref Rng 01/15/2015 08/04/2014 07/15/2014  Total Protein 6.1 - 8.1 g/dL 7.2 7.0 7.3  Albumin 3.6 - 5.1 g/dL 3.8 4.0 3.9  AST 10 - 35 U/L 85(H) 51(H) 58(H)  ALT 9 - 46 U/L 64(H) 54(H) 47  Alk Phosphatase 40 - 115 U/L 88 101 92  Total Bilirubin 0.2 - 1.2 mg/dL 2.5(H) 1.7(H) 3.3(H)  Bilirubin, Direct 0.0 - 0.3 mg/dL - 0.3 -   CBC Latest Ref Rng 01/15/2015 07/15/2014 11/29/2013  WBC 4.0 - 10.5 K/uL 8.3 8.6 9.5  Hemoglobin 13.0 - 17.0 g/dL 15.2 15.0 15.2  Hematocrit 39.0 - 52.0 % 45.4 43.8 45.7  Platelets 150 - 400 K/uL 181 180 209   Lab Results  Component Value Date   MCV 88.2  01/15/2015   MCV 87.4 07/15/2014   MCV 91.4 11/29/2013   Lab Results  Component Value Date   TSH 1.187 01/15/2015   Lab Results  Component Value Date   HGBA1C 6.8* 07/15/2014   Lipid Panel     Component Value Date/Time   CHOL 244* 01/15/2015 1620   CHOL 135 11/29/2013 0940   TRIG 149 01/15/2015 1620   TRIG 177* 11/29/2013 0940   HDL 30* 01/15/2015 1620   HDL 34* 11/29/2013 0940   CHOLHDL 8.1* 01/15/2015 1620   VLDL 30 01/15/2015 1620   LDLCALC 184* 01/15/2015 1620   LDLCALC 66 11/29/2013 0940     RADIOLOGY: No results found.    ASSESSMENT AND PLAN:  Mr. Serpe is a 64 year old gentleman with a history of moderate obesity, who is 8 years status post  CABG revascularization surgery x4. His nuclear perfusion study in December 2013 continued to show fairly normal perfusion without scar or ischemia. He does have significant mixed hyperlipidemia in the past has been documented to have a preponderance of small dense LDL particles and significantly increased triglycerides.  He had tolerated combination therapy with Niaspan 2000 mg and Crestor 20 mg without side effects.  However, he developed some mild LFT elevation and his statin and niacin were discontinued.  He underwent recent colonoscopy by Dr. Collene [name] and was found to have 2 benign polyps.  Subsequent LFT studies have shown continued transaminase elevation.  I have recommended that we follow this up today for reevaluation.  I suspect he may have hepato-stearrhea contributing to his LFT elevation and an ultrasound for further evaluation may be indicated.  His blood pressure today is stable on Benicar HCT 40/25 in addition to clonidine 0.1 mg twice a day, as well as metoprolol 100 mg twice a day.   He has documented small simple cysts in his kidneys.  He is also been found to have a small abdominal aortic aneurysm, measuring 3.5 cm noted on CT imaging in January 2015 and a subsequent ultrasound showed reduced AAA dimensions at 3.03.1.   He is not having any anginal symptoms.  His ECG remained stable.  I discussed the importance of weight loss, particularly with his metabolic syndrome, obesity, and probable fatty liver.  His LFTs continue to be elevated, I will recommend follow-up with Dr. Collene [name] who performed his colonoscopy.   I will see him in the office in 6 months for reevaluation or sooner if necessary.  Time spent:  25 minutes   Troy Sine, MD, [facility]  01/17/2015 11:51 AM

## 2015-01-22 ENCOUNTER — Encounter: Payer: Self-pay | Admitting: Cardiovascular Disease

## 2015-01-22 ENCOUNTER — Telehealth: Payer: Self-pay | Admitting: *Deleted

## 2015-01-22 NOTE — Telephone Encounter (Signed)
-----   Message from Troy Sine, MD sent at 01/19/2015  4:45 PM EST ----- Labs good x LFT's still elevated; Check liver ultrasound and f/u with Dr. Verdia Kuba; lipids increased will not start meds yet with increased LFT's; needs significantly improved diet.

## 2015-01-22 NOTE — Telephone Encounter (Signed)
Patient notified of lab results and recommendations. He informed me that he had a CT of his abdomen last February ordered by his urologist Dr Drema Pry and was told at that time that he has a fatty liver. He was seen by Dr Humphrey Rolls and was told to change his diet. No other therapy was recommended. I will notify Dr Claiborne Billings to see if he still wants to proceed with previous orders. Labs will be sent to Dr [name]. Message routed to Dr Claiborne Billings for review.

## 2015-01-25 NOTE — Telephone Encounter (Signed)
Ok to hold off liver US; but f/u LFTs in 2 month and f/u with Dr. [name]

## 2015-01-28 ENCOUNTER — Other Ambulatory Visit: Payer: Self-pay | Admitting: Cardiovascular Disease

## 2015-01-29 ENCOUNTER — Other Ambulatory Visit: Payer: Self-pay | Admitting: *Deleted

## 2015-01-29 DIAGNOSIS — R7989 Other specified abnormal findings of blood chemistry: Secondary | ICD-10-CM

## 2015-01-29 DIAGNOSIS — R945 Abnormal results of liver function studies: Principal | ICD-10-CM

## 2015-01-29 NOTE — Telephone Encounter (Signed)
Patient notified of recommendations. Lab orders mailed to patient. He states that he will follow up with Dr [name] after he has the follow up labs.

## 2015-03-03 ENCOUNTER — Telehealth: Payer: Self-pay | Admitting: Nurse Practitioner

## 2015-03-03 DIAGNOSIS — J0100 Acute maxillary sinusitis, unspecified: Secondary | ICD-10-CM

## 2015-03-03 MED ORDER — AMOXICILLIN-POT CLAVULANATE 875-125 MG PO TABS: 1.0000 | ORAL_TABLET | Freq: Two times a day (BID) | ORAL | Status: DC

## 2015-03-03 MED ORDER — BENZONATATE 100 MG PO CAPS: 100.0000 mg | ORAL_CAPSULE | Freq: Three times a day (TID) | ORAL | Status: DC | PRN

## 2015-03-03 NOTE — Progress Notes (Signed)

## 2015-03-06 ENCOUNTER — Other Ambulatory Visit: Payer: Self-pay | Admitting: *Deleted

## 2015-03-06 MED FILL — METOPROLOL TARTRATE 100 MG: 100 | 90 days supply | Qty: 180 | Fill #1

## 2015-03-11 ENCOUNTER — Telehealth: Payer: Self-pay | Admitting: Cardiovascular Disease

## 2015-03-11 MED FILL — OLMESARTAN-HCTZ 40-25 MG TA: 40-25 | 90 days supply | Qty: 90 | Fill #1

## 2015-03-11 NOTE — Telephone Encounter (Signed)
Returned call, advised OK.

## 2015-03-11 NOTE — Telephone Encounter (Signed)
Pt wants his Benicar to be generic,wants to know if this okay?

## 2015-04-14 DIAGNOSIS — R7989 Other specified abnormal findings of blood chemistry: Secondary | ICD-10-CM | POA: Diagnosis not present

## 2015-04-14 DIAGNOSIS — R799 Abnormal finding of blood chemistry, unspecified: Secondary | ICD-10-CM | POA: Diagnosis not present

## 2015-04-15 LAB — HEPATIC FUNCTION PANEL
ALBUMIN: 3.3 g/dL — AB (ref 3.6–5.1)
ALT: 40 U/L (ref 9–46)
AST: 55 U/L — AB (ref 10–35)
Alkaline Phosphatase: 86 U/L (ref 40–115)
BILIRUBIN TOTAL: 2.8 mg/dL — AB (ref 0.2–1.2)
Bilirubin, Direct: 0.5 mg/dL — ABNORMAL HIGH (ref <=0.2)
Indirect Bilirubin: 2.3 mg/dL — ABNORMAL HIGH (ref 0.2–1.2)
TOTAL PROTEIN: 7.6 g/dL (ref 6.1–8.1)

## 2015-04-15 MED FILL — cloNIDine HCL 0.1 MG TABS: 0.1 | 90 days supply | Qty: 180 | Fill #1

## 2015-04-24 ENCOUNTER — Encounter: Payer: Self-pay | Admitting: *Deleted

## 2015-05-19 MED FILL — OMEGA-3 ETHYL ESTERS 1 GM C: 1 | 90 days supply | Qty: 180 | Fill #1

## 2015-06-10 MED FILL — OLMESARTAN-HCTZ 40-25 MG TA: 40-25 | 90 days supply | Qty: 90 | Fill #2

## 2015-06-17 ENCOUNTER — Other Ambulatory Visit: Payer: Self-pay | Admitting: Cardiovascular Disease

## 2015-06-17 MED FILL — METOPROLOL TARTRATE 100 MG: 100 | 90 days supply | Qty: 180 | Fill #0

## 2015-06-17 NOTE — Telephone Encounter (Signed)
Rx has been sent to the pharmacy electronically. ° °

## 2015-07-20 ENCOUNTER — Ambulatory Visit (INDEPENDENT_AMBULATORY_CARE_PROVIDER_SITE_OTHER): Payer: 59 | Admitting: Cardiovascular Disease

## 2015-07-20 ENCOUNTER — Encounter: Payer: Self-pay | Admitting: Cardiovascular Disease

## 2015-07-20 VITALS — BP 116/74 | HR 65 | Ht 69.0 in | Wt 255.0 lb

## 2015-07-20 DIAGNOSIS — E8881 Metabolic syndrome: Secondary | ICD-10-CM

## 2015-07-20 DIAGNOSIS — E782 Mixed hyperlipidemia: Secondary | ICD-10-CM

## 2015-07-20 DIAGNOSIS — I10 Essential (primary) hypertension: Secondary | ICD-10-CM

## 2015-07-20 DIAGNOSIS — R945 Abnormal results of liver function studies: Secondary | ICD-10-CM

## 2015-07-20 DIAGNOSIS — E668 Other obesity: Secondary | ICD-10-CM

## 2015-07-20 DIAGNOSIS — I714 Abdominal aortic aneurysm, without rupture, unspecified: Secondary | ICD-10-CM

## 2015-07-20 DIAGNOSIS — I2510 Atherosclerotic heart disease of native coronary artery without angina pectoris: Secondary | ICD-10-CM

## 2015-07-20 DIAGNOSIS — Z79899 Other long term (current) drug therapy: Secondary | ICD-10-CM

## 2015-07-20 DIAGNOSIS — E669 Obesity, unspecified: Secondary | ICD-10-CM

## 2015-07-20 DIAGNOSIS — R7989 Other specified abnormal findings of blood chemistry: Secondary | ICD-10-CM

## 2015-07-20 LAB — COMPREHENSIVE METABOLIC PANEL
ALBUMIN: 3.6 g/dL (ref 3.6–5.1)
ALT: 53 U/L — ABNORMAL HIGH (ref 9–46)
AST: 82 U/L — AB (ref 10–35)
Alkaline Phosphatase: 81 U/L (ref 40–115)
BILIRUBIN TOTAL: 2.6 mg/dL — AB (ref 0.2–1.2)
BUN: 8 mg/dL (ref 7–25)
CALCIUM: 9.3 mg/dL (ref 8.6–10.3)
CO2: 25 mmol/L (ref 20–31)
Chloride: 102 mmol/L (ref 98–110)
Creat: 0.87 mg/dL (ref 0.70–1.25)
Glucose, Bld: 105 mg/dL — ABNORMAL HIGH (ref 65–99)
POTASSIUM: 4.2 mmol/L (ref 3.5–5.3)
Sodium: 137 mmol/L (ref 135–146)
Total Protein: 7.2 g/dL (ref 6.1–8.1)

## 2015-07-20 LAB — LIPID PANEL
CHOL/HDL RATIO: 7 ratio — AB (ref <=5.0)
Cholesterol: 231 mg/dL — ABNORMAL HIGH (ref 125–200)
HDL: 33 mg/dL — AB (ref >=40)
LDL CALC: 177 mg/dL — AB (ref <130)
TRIGLYCERIDES: 103 mg/dL (ref <150)
VLDL: 21 mg/dL (ref <30)

## 2015-07-20 LAB — CBC
HEMATOCRIT: 42.8 % (ref 38.5–50.0)
HEMOGLOBIN: 14.8 g/dL (ref 13.2–17.1)
MCH: 30.1 pg (ref 27.0–33.0)
MCHC: 34.6 g/dL (ref 32.0–36.0)
MCV: 87 fL (ref 80.0–100.0)
MPV: 11.4 fL (ref 7.5–12.5)
Platelets: 149 10*3/uL (ref 140–400)
RBC: 4.92 MIL/uL (ref 4.20–5.80)
RDW: 15.4 % — ABNORMAL HIGH (ref 11.0–15.0)
WBC: 7 10*3/uL (ref 3.8–10.8)

## 2015-07-20 LAB — TSH: TSH: 2.11 m[IU]/L (ref 0.40–4.50)

## 2015-07-20 NOTE — Patient Instructions (Signed)
Your physician has recommended you make the following change in your medication: STOP the clonidine  Your physician recommends that you return for lab work TODAY.  Your physician wants you to follow-up in: 6 months or sooner if needed. You will receive a reminder letter in the mail two months in advance. If you don't receive a letter, please call our office to schedule the follow-up appointment.  If you need a refill on your cardiac medications before your next appointment, please call your pharmacy.

## 2015-07-21 ENCOUNTER — Encounter: Payer: Self-pay | Admitting: Cardiovascular Disease

## 2015-07-21 NOTE — Progress Notes (Signed)
Patient ID: William [name], male   DOB: 06/12/1950, 65 y.o.   MRN: [id number]    HPI: William [name] is a 65 y.o. male who presents to the office today for a 58-monthcardiology evaluation.  William [name] known CAD and underwent CABG revascularization surgery with a LIMA to the LAD, SVG to the RCA, SVG to the circumflex vessel in February 2008.  He has a history of obesity, metabolic syndrome, and mixed hyperlipidemia with preponderance of small dense LDL some particles and increased TG for which he has been on combination therapy.  In December 2013 a nuclear perfusion study was unchanged from 2 years previously and did not show significant scar or ischemia. In January 2014,  I adjusted his antihypertensive medications and started  Benicar HCT 40/25 for optimal blood pressure control with increase diuretic regimen and took him off his Avalide. He has felt improved on this therapy.  According to his wife, his blood pressure at home typically is in the 120s systolially and in the 762Xdiastolically.  He has a history of moderate obesity.  He does not routinely exercise.  He also does note some dietary discretion. He has noticed some mild leg swelling intermittently. He denies recent anginal symptoms. He denies palpitations.   In 2016 he underwent a urologic evaluation for gross hematuria. A CT of his abdomen and pelvis did not reveal any evidence for renal calculi or hydronephrosis or evidence for renal calculi or dilatation. He did have small benign renal cysts. He was also noted a 3.5 cm infrarenal abdominal aortic aneurysm. There was also a suggestion of fatty infiltration of his liver.  He underwent a follow-up abdominal aortic ultrasound on 06/27/2014.  This showed his distal aorta measuring 3.0 x 3.1 cm and he had mild amount of atherosclerosis.  He underwent a  colonoscopy by Dr. JTyrone [name] was found to have 2 benign polyps.  He has been off  Crestor and niacin since last year due to elevation of  his liver enzymes. There is family history for liver disease in that his mother died from liver disease and his brother had a liver transplant.  In July 2016, his AST was 51 and ALT was 54.  Subsequent blood work by Dr. MCollene [name] his AST increased at 87 and ALT 68.  On 01/15/2015, his AST was 85 and ALT 64.  At that time, total cholesterol was 244, triglycerides 149, HDL 30, and his LDL cholesterol on statin therapy had risen to 184.   Since I last saw him he denies chest tightness.  He denies shortness of breath.  He is unaware of palpitations.  He denies PND, orthopnea.  He has not had repeat lab work but is fasting today.  He presents for reevaluation.  Past Medical History  Diagnosis Date  . Hypertension   . Hyperlipidemia     Past Surgical History  Procedure Laterality Date  . Cardiac catheterization    . Coronary artery bypass graft    . Coronary angioplasty      No Known Allergies  Current Outpatient Prescriptions  Medication Sig Dispense Refill  . aspirin 81 MG tablet Take 81 mg by mouth daily.    .Marland KitchenBENICAR HCT 40-25 MG tablet TAKE 1 TABLET BY MOUTH ONCE DAILY 90 tablet 1  . cholecalciferol (VITAMIN D) 1000 UNITS tablet Take 1,000 Units by mouth daily.    . Garlic 15284MG CAPS Take 1 capsule by mouth 2 (two) times daily.    .[name]  metoprolol (LOPRESSOR) 100 MG tablet TAKE 1 TABLET BY MOUTH TWICE DAILY 180 tablet 1  . omega-3 acid ethyl esters (LOVAZA) 1 G capsule TAKE 2 CAPSULES BY MOUTH ONCE DAILY 180 capsule 3   No current facility-administered medications for this visit.    Socially he's married has 3 children 3 grandchildren. Does not routinely exercise. In the past he had been walking 4 miles on a treadmill but has not done this in some time. There is no tobacco or alcohol use.  ROS General: Negative; No fevers, chills, or night sweats;  HEENT: Negative; No changes in vision or hearing, sinus congestion, difficulty swallowing Pulmonary: Negative; No cough, wheezing,  shortness of breath, hemoptysis Cardiovascular: Negative; No chest pain, presyncope, syncope, palpitations Positive for intermittent mild leg swelling, which has significantly improved with the diuretic regimen GI: Positive for polyps on colonoscopy; positive for fatty liver GU: Negative; No dysuria, hematuria, or difficulty voiding Musculoskeletal: Negative; no myalgias, joint pain, or weakness Hematologic/Oncology: Negative; no easy bruising, bleeding Endocrine: Positive for metabolic syndrome no heat/cold intolerance; no overt diabetes Neuro: Negative; no changes in balance, headaches Skin: Negative; No rashes or skin lesions Psychiatric: Negative; No behavioral problems, depression Sleep: Negative; No snoring, daytime sleepiness, hypersomnolence, bruxism, restless legs, hypnogognic hallucinations, no cataplexy Other comprehensive 14 point system review is negative.   PE BP 116/74 mmHg  Pulse 65  Ht _0  (1.753 m)  Wt 255 lb (115.667 kg)  BMI 37.64 kg/m2  Repeat blood pressure 116/72 when taken by me.  Wt Readings from Last 3 Encounters:  07/20/15 255 lb (115.667 kg)  01/15/15 257 lb 8 oz (116.801 kg)  07/15/14 261 lb 12.8 oz (118.752 kg)   General: Alert, oriented, no distress.  Skin: normal turgor, no rashes HEENT: Normocephalic, atraumatic. Pupils round and reactive; sclera anicteric;no lid lag. No xanthelasmas. No lid lag. Nose without nasal septal hypertrophy Mouth/Parynx benign; Mallinpatti scale 3 Neck: Thick neck; No JVD, no carotid bruits normal carotid upstroke. Lungs: clear to ausculatation and percussion; no wheezing or rales Heart: RRR, s1 s2 normal 1/6 systolic murmur; no S3 or S4 gallop.  No rubs, thrills, or heaves Chest wall: Nontender to palpation Abdomen: Moderately severe abdominal obesity. soft, nontender; no hepatosplenomehaly, BS+; abdominal aorta nontender and not dilated by palpation. Back: No CVA tenderness Pulses 2+ Extremities: Trivial pretibial  edema bilaterally above the sock line; no clubbing cyanosis, Homan's sign negative  Neurologic: grossly nonfocal Psychological: Normal affect and mood  ECG (independently read by me): Normal sinus rhythm at 65 bpm with first-degree AV block with a PR interval of 214 ms.  Mild RV conduction delay.  Poor anterior R-wave progression V1 through V4.  December 2016 ECG (independently read by me): Normal sinus rhythm at 66 bpm.  First-degree AV block with a PR interval at 224 ms.  June 2016 ECG (independently read by me): Normal sinus rhythm at 66. First-degree block with PR interval 28 ms.  Incomplete right bundle branch block.  QRS complex V1-3.   October 2015 ECG (independently read by me): Sinus rhythm with first-degree AV block.  Incomplete right bundle branch block.  QTc interval 447 ms; first-degree AV block with a PR interval of 220 ms  03/19/2013 ECG (independently read by me): Normal sinus rhythm at 69 beats per minute. No ectopy. Borderline first-degree block with a PR interval of 216 ms.  Prior ECG of July 18,2014:  Sinus rhythm at 57 beats per minute. Borderline first-degree block with a period of 1214 ms. No  significant ST abnormalities.  LABS: BMP Latest Ref Rng 07/20/2015 01/15/2015 07/15/2014  Glucose 65 - 99 mg/dL 105(H) 100(H) 104(H)  BUN 7 - 25 mg/dL _0 Creatinine 0.70 - 1.25 mg/dL 0.87 0.89 0.96  Sodium 135 - 146 mmol/L 137 138 136  Potassium 3.5 - 5.3 mmol/L 4.2 4.3 4.5  Chloride 98 - 110 mmol/L 102 100 100  CO2 20 - 31 mmol/L _1 Calcium 8.6 - 10.3 mg/dL 9.3 9.5 9.6   Hepatic Function Latest Ref Rng 07/20/2015 04/14/2015 01/15/2015  Total Protein 6.1 - 8.1 g/dL 7.2 7.6 7.2  Albumin 3.6 - 5.1 g/dL 3.6 3.3(L) 3.8  AST 10 - 35 U/L 82(H) 55(H) 85(H)  ALT 9 - 46 U/L 53(H) 40 64(H)  Alk Phosphatase 40 - 115 U/L 81 86 88  Total Bilirubin 0.2 - 1.2 mg/dL 2.6(H) 2.8(H) 2.5(H)  Bilirubin, Direct <=0.2 mg/dL - 0.5(H) -   CBC Latest Ref Rng 07/20/2015 01/15/2015 07/15/2014    WBC 3.8 - 10.8 K/uL 7.0 8.3 8.6  Hemoglobin 13.2 - 17.1 g/dL 14.8 15.2 15.0  Hematocrit 38.5 - 50.0 % 42.8 45.4 43.8  Platelets 140 - 400 K/uL 149 181 180   Lab Results  Component Value Date   MCV 87.0 07/20/2015   MCV 88.2 01/15/2015   MCV 87.4 07/15/2014   Lab Results  Component Value Date   TSH 2.11 07/20/2015   Lab Results  Component Value Date   HGBA1C 6.8* 07/15/2014   Lipid Panel     Component Value Date/Time   CHOL 231* 07/20/2015 0949   CHOL 135 11/29/2013 0940   TRIG 103 07/20/2015 0949   TRIG 177* 11/29/2013 0940   HDL 33* 07/20/2015 0949   HDL 34* 11/29/2013 0940   CHOLHDL 7.0* 07/20/2015 0949   VLDL 21 07/20/2015 0949   LDLCALC 177* 07/20/2015 0949   LDLCALC 66 11/29/2013 0940     RADIOLOGY: No results found.    ASSESSMENT AND PLAN: William [name] is a 65 year old gentleman with a history of moderate obesity, who is 9 years status post  CABG revascularization surgery x4. His nuclear perfusion study in December 2013 continued to show fairly normal perfusion without scar or ischemia. He has significant mixed hyperlipidemia and in the past has been documented to have a preponderance of small dense LDL particles and significantly increased triglycerides.  He had tolerated combination therapy with Niaspan 2000 mg and Crestor 20 mg without side effects.  However, he developed some  LFT elevation and his statin and niacin were discontinued.   Subsequent LFT studies have shown continued transaminase elevation.   I suspect he may have hepato-stearrhea contributing to his LFT elevation and an ultrasound for further evaluation may be indicated.  His blood pressure today is stable on Benicar HCT 40/25 in addition to clonidine 0.1 mg twice a day, as well as metoprolol 100 mg twice a day.  He has told me that there've been instances where his blood pressure had gotten low with systolic pressures at 88 to the low 90s.  I have suggested that he  discontinue clonidine and continue  to monitor his blood pressure. He has documented small simple cysts in his kidneys.  He is also been found to have a small abdominal aortic aneurysm, measuring 3.5 cm noted on CT imaging in January 2015 and a subsequent ultrasound showed reduced AAA dimensions at 3.03.1.  He is not having any anginal symptoms.  His ECG remained stable.  I again discussed the  importance of weight loss, particularly with his metabolic syndrome, obesity, and probable fatty liver.  He was fasting today, and repeat blood work was obtained.  LFTs continue to be mildly elevated and his bilirubin is 2.6.  I have suggested GI follow-up evaluation with Dr. [name].  His lipids continued to be increased, but with his LFT elevation.  I will not institute pharmacotherapy.  I again discussed.  Improve diet, weight loss.  I will see him in 6 months for cardiology reevaluation.  Time spent: 25 minutes   William Sine, MD, [facility]  07/21/2015 6:26 AM

## 2015-07-23 ENCOUNTER — Telehealth: Payer: Self-pay | Admitting: *Deleted

## 2015-07-23 NOTE — Telephone Encounter (Signed)
Left message on patient's cell number to check my chart for lab results and recommendations.

## 2015-07-23 NOTE — Telephone Encounter (Signed)
-----   Message from Troy Sine, MD sent at 07/21/2015  6:40 AM EDT ----- LFTs remain elevated with inc Bili;  Recommend f/u GI eval with Dr. Verdia Kuba.  Needs improved diet with lipids.

## 2015-08-06 NOTE — Addendum Note (Signed)
Addended by: Therisa Doyne on: 08/06/2015 03:46 PM   Modules accepted: Orders

## 2015-09-03 MED FILL — OMEGA-3 ETHYL ESTERS 1 GM C: 1 | 90 days supply | Qty: 180 | Fill #2

## 2015-09-10 ENCOUNTER — Other Ambulatory Visit: Payer: Self-pay | Admitting: Cardiovascular Disease

## 2015-09-11 MED FILL — OLMESARTAN-HCTZ 40-25 MG TA: 40-25 | 90 days supply | Qty: 90 | Fill #0

## 2015-09-24 MED FILL — METOPROLOL TARTRATE 100 MG: 100 | 90 days supply | Qty: 180 | Fill #1

## 2015-12-02 ENCOUNTER — Ambulatory Visit (INDEPENDENT_AMBULATORY_CARE_PROVIDER_SITE_OTHER): Payer: 59 | Admitting: Family Medicine

## 2015-12-02 ENCOUNTER — Encounter: Payer: Self-pay | Admitting: Family Medicine

## 2015-12-02 VITALS — BP 155/84 | HR 66 | Temp 99.3°F | Resp 16 | Ht 68.75 in | Wt 263.4 lb

## 2015-12-02 DIAGNOSIS — K7581 Nonalcoholic steatohepatitis (NASH): Secondary | ICD-10-CM | POA: Diagnosis not present

## 2015-12-02 DIAGNOSIS — E118 Type 2 diabetes mellitus with unspecified complications: Secondary | ICD-10-CM

## 2015-12-02 DIAGNOSIS — Z23 Encounter for immunization: Secondary | ICD-10-CM | POA: Diagnosis not present

## 2015-12-02 DIAGNOSIS — I10 Essential (primary) hypertension: Secondary | ICD-10-CM

## 2015-12-02 LAB — COMPREHENSIVE METABOLIC PANEL
ALBUMIN: 3.5 g/dL (ref 3.5–5.2)
ALK PHOS: 93 U/L (ref 39–117)
ALT: 42 U/L (ref 0–53)
AST: 71 U/L — ABNORMAL HIGH (ref 0–37)
BILIRUBIN TOTAL: 3.8 mg/dL — AB (ref 0.2–1.2)
BUN: 13 mg/dL (ref 6–23)
CO2: 29 mEq/L (ref 19–32)
Calcium: 9.8 mg/dL (ref 8.4–10.5)
Chloride: 102 mEq/L (ref 96–112)
Creatinine, Ser: 0.98 mg/dL (ref 0.40–1.50)
GFR: 81.28 mL/min (ref >60.00)
Glucose, Bld: 101 mg/dL — ABNORMAL HIGH (ref 70–99)
POTASSIUM: 4.7 meq/L (ref 3.5–5.1)
SODIUM: 139 meq/L (ref 135–145)
TOTAL PROTEIN: 7.3 g/dL (ref 6.0–8.3)

## 2015-12-02 LAB — HEMOGLOBIN A1C: HEMOGLOBIN A1C: 6.6 % — AB (ref 4.6–6.5)

## 2015-12-02 NOTE — Progress Notes (Signed)
Pre visit review using our clinic review tool, if applicable. No additional management support is needed unless otherwise documented below in the visit note. 

## 2015-12-02 NOTE — Progress Notes (Signed)
Office Note 12/02/2015  CC:  Chief Complaint  Patient presents with  . Establish Care    HPI:  William [name] is a 65 y.o. male who is here to establish care. Patient's most recent primary MD: [facility] but he has not been there since 2008. Old records in [place] were reviewed prior to or during today's visit.  He monitors his bp at home: 120s/70s avg.  He doesn't exercise any at all. Diet: very intermittent with good dietary choices.  Says he is not motivated/disciplined enough.  I see a past Hba1c of 6.8% but it appears this fell through the cracks b/c there is no mention of any response to the result such as nutritionist referral or starting a med or repeat of lab (or even any sign that the patient was notified).  Past Medical History:  Diagnosis Date  . AAA (abdominal aortic aneurysm) ([place]) 03/2014   3.2 cm on MR abd.  F/u aortic u/s 06/2014 showed 3.0 x 3.1 cm AAA.  . Bilateral renal cysts    simple (03/2014 MRI)  . CAD (coronary artery disease)   . Diabetes mellitus with complication ([place]) 10/9831   A1c 6.8%  . Hyperlipemia, mixed   . Hypertension   . Microscopic hematuria    Eval unremarkable by Dr. Eulogio Ditch.  [name] NASH (nonalcoholic steatohepatitis)    Fatty liver on MR abd 03/2014, + hx of elevated transaminases and bili: followed by Dr. [name].  . Obesity     Past Surgical History:  Procedure Laterality Date  . CARDIAC CATHETERIZATION    . CARDIOVASCULAR STRESS TEST  01/17/2012   Normal stress nuclear study  . CERVICAL DISCECTOMY  1992  . COLONOSCOPY W/ POLYPECTOMY  09/10/2014   Polypectomy x 2: recall 5 yrs (Dr. [name]).  . CORONARY ANGIOPLASTY    . CORONARY ARTERY BYPASS GRAFT  03/2006  . VASECTOMY  1977    Family History  Problem Relation Age of Onset  . Hypertension Mother   . Cancer - Other Mother     liver cancer  . Heart disease Father   . Heart attack Father   . Cancer - Lung Father   . Diabetes Father   . Liver disease Sister   . Heart  disease Brother     CABG 20 YEARS AGO  . Hypertension Brother   . Mesothelioma Brother     HALF-BROTHER  . Cancer - Lung Brother   . Cancer - Lung Sister     HALF-SISTER  . Cancer - Other Sister     KIDNEY  (HALF-SISTER)  . Liver disease Brother   . Heart disease Brother     CABG-2012    Social History   Social History  . Marital status: Married    Spouse name: N/A  . Number of children: N/A  . Years of education: N/A   Occupational History  . Not on file.   Social History Main Topics  . Smoking status: Former Smoker    Types: Cigarettes  . Smokeless tobacco: Never Used     Comment: QUIT I 1983  . Alcohol use No  . Drug use: No  . Sexual activity: Not on file   Other Topics Concern  . Not on file   Social History Narrative   Married, 3 grown children, 3 GCs.   Educ: 10th grade.   Occupation: Retired Brewing technologist.   Tob: quit 1983, smoked about 20 pack-yr hx prior.   No alcohol.    Outpatient Encounter  Prescriptions as of 12/02/2015  Medication Sig  . aspirin 81 MG tablet Take 81 mg by mouth daily.  . cholecalciferol (VITAMIN D) 1000 UNITS tablet Take 1,000 Units by mouth daily.  . Garlic 4888 MG CAPS Take 1 capsule by mouth 2 (two) times daily.  . metoprolol (LOPRESSOR) 100 MG tablet TAKE 1 TABLET BY MOUTH TWICE DAILY  . olmesartan-hydrochlorothiazide (BENICAR HCT) 40-25 MG tablet TAKE 1 TABLET BY MOUTH ONCE DAILY  . omega-3 acid ethyl esters (LOVAZA) 1 G capsule TAKE 2 CAPSULES BY MOUTH ONCE DAILY   No facility-administered encounter medications on file as of 12/02/2015.     No Known Allergies  ROS Review of Systems  Constitutional: Negative for fatigue and fever.  HENT: Negative for congestion and sore throat.   Eyes: Negative for visual disturbance.  Respiratory: Negative for cough and shortness of breath.   Cardiovascular: Negative for chest pain.  Gastrointestinal: Negative for abdominal pain, blood in stool and nausea.  Genitourinary:  Negative for dysuria.  Musculoskeletal: Negative for back pain and joint swelling.  Skin: Negative for rash.  Neurological: Negative for weakness and headaches.  Hematological: Negative for adenopathy.    PE; Blood pressure (!) 155/84, pulse 66, temperature 99.3 F (37.4 C), temperature source Temporal, resp. rate 16, height 5' 8.75" (1.746 m), weight 263 lb 6.4 oz (119.5 kg), SpO2 98 %. Gen: Alert, well appearing.  Patient is oriented to person, place, time, and situation. AFFECT: pleasant, lucid thought and speech. BVQ:XIHW: no injection, icteris, swelling, or exudate.  EOMI, PERRLA. Mouth: lips without lesion/swelling.  Oral mucosa pink and moist. Oropharynx without erythema, exudate, or swelling.  Neck: supple/nontender.  No LAD, mass, or TM.  Carotid pulses 2+ bilaterally, without bruits. CV: RRR, 2/6 systolic murmur at LUSB, no diastolic murmur.  No rub or gallop. Chest is clear, no wheezing or rales. Normal symmetric air entry throughout both lung fields. No chest wall deformities or tenderness. ABD: soft, NT, rotund but ND, BS normal.  No hepatospenomegaly or mass.  No bruits. EXT: no clubbing or cyanosis.  1-2+ pitting edema in both LL's.  Pertinent labs:  Lab Results  Component Value Date   TSH 2.11 07/20/2015   Lab Results  Component Value Date   WBC 7.0 07/20/2015   HGB 14.8 07/20/2015   HCT 42.8 07/20/2015   MCV 87.0 07/20/2015   PLT 149 07/20/2015   Lab Results  Component Value Date   CREATININE 0.87 07/20/2015   BUN 8 07/20/2015   NA 137 07/20/2015   K 4.2 07/20/2015   CL 102 07/20/2015   CO2 25 07/20/2015   Lab Results  Component Value Date   ALT 53 (H) 07/20/2015   AST 82 (H) 07/20/2015   ALKPHOS 81 07/20/2015   BILITOT 2.6 (H) 07/20/2015   Lab Results  Component Value Date   CHOL 231 (H) 07/20/2015   Lab Results  Component Value Date   HDL 33 (L) 07/20/2015   Lab Results  Component Value Date   LDLCALC 177 (H) 07/20/2015   Lab Results   Component Value Date   TRIG 103 07/20/2015   Lab Results  Component Value Date   CHOLHDL 7.0 (H) 07/20/2015   Lab Results  Component Value Date   HGBA1C 6.8 (H) 07/15/2014   ASSESSMENT AND PLAN:   New pt; no old PCP records to obtain. Will get GI records.  1) DM 2, with CV dz: by A1c criteria back in 2016.   Will repeat this today as  well as fasting glucose. Encouraged more healthy diet and start back exercising.  2) HTN; The current medical regimen is effective;  continue present plan and medications. BP up here some but home monitoring consistently normal.  3) NASH: followed by Dr. [name], will get records.  No statins can be used due to this condition. He doesn't drink alcohol.  Essentially, getting this better comes down to exercise/diet/wt loss.  4) CAD: asymptomatic s/p CABG.  Gets regular cardiology f/u for his CAD and his AAA is followed by them as well.  5) Preventative health care: flu vaccine today.  Will give prevnar at next office f/u.  Return in about 4 months (around 04/03/2016) for routine chronic illness f/u.  Signed:  Crissie Sickles, MD           12/02/2015

## 2015-12-15 MED FILL — OLMESARTAN-HCTZ 40-25 MG TA: 40-25 | 90 days supply | Qty: 90 | Fill #1

## 2015-12-23 ENCOUNTER — Other Ambulatory Visit: Payer: Self-pay | Admitting: Cardiovascular Disease

## 2015-12-23 MED FILL — METOPROLOL TARTRATE 100 MG: 100 | 90 days supply | Qty: 180 | Fill #0

## 2015-12-23 MED FILL — OMEGA-3 ETHYL ESTERS 1 GM C: 1 | 90 days supply | Qty: 180 | Fill #3

## 2015-12-28 ENCOUNTER — Encounter: Payer: Self-pay | Admitting: Cardiovascular Disease

## 2016-01-01 ENCOUNTER — Telehealth: Payer: 59 | Admitting: Family

## 2016-01-01 DIAGNOSIS — B9689 Other specified bacterial agents as the cause of diseases classified elsewhere: Secondary | ICD-10-CM

## 2016-01-01 DIAGNOSIS — J329 Chronic sinusitis, unspecified: Secondary | ICD-10-CM | POA: Diagnosis not present

## 2016-01-01 MED ORDER — FLUTICASONE PROPIONATE 50 MCG/ACT NA SUSP: 2.0000 | Freq: Every day | NASAL | 6 refills | Status: DC

## 2016-01-01 MED ORDER — AMOXICILLIN-POT CLAVULANATE 875-125 MG PO TABS: 1.0000 | ORAL_TABLET | Freq: Two times a day (BID) | ORAL | 0 refills | Status: AC

## 2016-01-01 NOTE — Progress Notes (Signed)
We are sorry that you are not feeling well.  Here is how we plan to help!  Based on what you have shared with me it looks like you have sinusitis.  Sinusitis is inflammation and infection in the sinus cavities of the head.  Based on your presentation I believe you most likely have Acute Bacterial Sinusitis.  This is an infection caused by bacteria and is treated with antibiotics. I have prescribed Augmentin 824m/125mg one tablet twice daily with food, for 7 days. You may use an oral decongestant such as Mucinex D or if you have glaucoma or high blood pressure use plain Mucinex. Saline nasal spray help and can safely be used as often as needed for congestion.  If you develop worsening sinus pain, fever or notice severe headache and vision changes, or if symptoms are not better after completion of antibiotic, please schedule an appointment with a health care provider.    Sinus infections are not as easily transmitted as other respiratory infection, however we still recommend that you avoid close contact with loved ones, especially the very young and elderly.  Remember to wash your hands thoroughly throughout the day as this is the number one way to prevent the spread of infection!  Do not use the Afrin past 3 days; it will cause rebound congestion and will worsen. I will prescribe Flonase for you to use instead (2 sprays per nare, daily).   Home Care:  Only take medications as instructed by your medical team.  Complete the entire course of an antibiotic.  Do not take these medications with alcohol.  A steam or ultrasonic humidifier can help congestion.  You can place a towel over your head and breathe in the steam from hot water coming from a faucet.  Avoid close contacts especially the very young and the elderly.  Cover your mouth when you cough or sneeze.  Always remember to wash your hands.  Get Help Right Away If:  You develop worsening fever or sinus pain.  You develop a severe head  ache or visual changes.  Your symptoms persist after you have completed your treatment plan.  Make sure you  Understand these instructions.  Will watch your condition.  Will get help right away if you are not doing well or get worse.  Your e-visit answers were reviewed by a board certified advanced clinical practitioner to complete your personal care plan.  Depending on the condition, your plan could have included both over the counter or prescription medications.  If there is a problem please reply  once you have received a response from your provider.  Your safety is important to [place]  If you have drug allergies check your prescription carefully.    You can use MyChart to ask questions about today's visit, request a non-urgent call back, or ask for a work or school excuse for 24 hours related to this e-Visit. If it has been greater than 24 hours you will need to follow up with your provider, or enter a new e-Visit to address those concerns.  You will get an e-mail in the next two days asking about your experience.  I hope that your e-visit has been valuable and will speed your recovery. Thank you for using e-visits.

## 2016-01-20 ENCOUNTER — Encounter: Payer: Self-pay | Admitting: Cardiovascular Disease

## 2016-01-20 ENCOUNTER — Ambulatory Visit (INDEPENDENT_AMBULATORY_CARE_PROVIDER_SITE_OTHER): Payer: 59 | Admitting: Cardiovascular Disease

## 2016-01-20 VITALS — BP 142/79 | HR 63 | Ht 68.5 in | Wt 261.8 lb

## 2016-01-20 DIAGNOSIS — R7989 Other specified abnormal findings of blood chemistry: Secondary | ICD-10-CM

## 2016-01-20 DIAGNOSIS — I2510 Atherosclerotic heart disease of native coronary artery without angina pectoris: Secondary | ICD-10-CM

## 2016-01-20 DIAGNOSIS — E8881 Metabolic syndrome: Secondary | ICD-10-CM | POA: Diagnosis not present

## 2016-01-20 DIAGNOSIS — I714 Abdominal aortic aneurysm, without rupture, unspecified: Secondary | ICD-10-CM

## 2016-01-20 DIAGNOSIS — R945 Abnormal results of liver function studies: Secondary | ICD-10-CM

## 2016-01-20 DIAGNOSIS — I10 Essential (primary) hypertension: Secondary | ICD-10-CM

## 2016-01-20 DIAGNOSIS — E782 Mixed hyperlipidemia: Secondary | ICD-10-CM

## 2016-01-20 NOTE — Patient Instructions (Addendum)
Your physician wants you to follow-up in: March/April 2018 or sooner if needed. You will receive a reminder letter in the mail two months in advance. If you don't receive a letter, please call our office to schedule the follow-up appointment.  If you need a refill on your cardiac medications before your next appointment, please call your pharmacy.

## 2016-01-22 NOTE — Progress Notes (Signed)
Patient ID: JERYN CERNEY, male   DOB: [birth year], 65 y.o.   MRN: [id number]    HPI: ROBEN SCHLIEP is a 65 y.o. male who presents to the office today for a 106-monthcardiology evaluation.  Mr. SHaisleyhas known CAD and underwent CABG revascularization surgery with a LIMA to the LAD, SVG to the RCA, SVG to the circumflex vessel in February 2008.  He has a history of obesity, metabolic syndrome, and mixed hyperlipidemia with preponderance of small dense LDL some particles and increased TG for which he has been on combination therapy.  In December 2013 a nuclear perfusion study was unchanged from 2 years previously and did not show significant scar or ischemia. In January 2014,  I adjusted his antihypertensive medications and started  Benicar HCT 40/25 for optimal blood pressure control with increase diuretic regimen and took him off his Avalide. He has felt improved on this therapy.  According to his wife, his blood pressure at home typically is in the 120s systolially and in the 741Pdiastolically.  He has a history of moderate obesity.  He does not routinely exercise.  He also does note some dietary discretion. He has noticed some mild leg swelling intermittently. He denies recent anginal symptoms. He denies palpitations.   In 2016 he underwent a urologic evaluation for gross hematuria. A CT of his abdomen and pelvis did not reveal any evidence for renal calculi or hydronephrosis or evidence for renal calculi or dilatation. He did have small benign renal cysts. He was also noted a 3.5 cm infrarenal abdominal aortic aneurysm. There was also a suggestion of fatty infiltration of his liver.  He underwent a follow-up abdominal aortic ultrasound on 06/27/2014.  This showed his distal aorta measuring 3.0 x 3.1 cm and he had mild amount of atherosclerosis.  He underwent a  colonoscopy by Dr. JTyrone Sageand was found to have 2 benign polyps.  He has been off  Crestor and niacin since last year due to elevation of  his liver enzymes. There is family history for liver disease in that his mother died from liver disease and his brother had a liver transplant.  In July 2016, his AST was 51 and ALT was 54.  Subsequent blood work by Dr. MCollene Maresrevealed his AST increased at 87 and ALT 68.  On 01/15/2015, his AST was 85 and ALT 64.  At that time, total cholesterol was 244, triglycerides 149, HDL 30, and his LDL cholesterol on statin therapy had risen to 184.   Since I last saw him he denies chest tightness.  He has not been successful with weight loss and in fact has gained some weight.  He denies any palpitations.  His primary physician had checked laboratory 2 months ago and hemoglobin A1c was 6.6.  LFTs had slightly improved but AST was still elevated at 71 and his ALT was now normal.  He denies shortness of breath.  He is unaware of palpitations.  He denies PND, orthopnea.    He presents for reevaluation.  Past Medical History:  Diagnosis Date  . AAA [place]) [place] 03/2014   3.2 cm on MR abd.  F/u aortic u/s 06/2014 showed 3.0 x 3.1 cm AAA: recheck 2 yrs recommended (followed by cardiologist)  . Bilateral renal cysts    simple (03/2014 MRI)  . CAD (coronary artery disease)   . Diabetes [place] complication ([place] 037/9024  A1c 6.8%  . Hyperlipemia, mixed   . Hypertension   .  Microscopic hematuria    Eval unremarkable by Dr. Eulogio Ditch.  [name] NASH (nonalcoholic steatohepatitis)    Fatty liver on MR abd 03/2014, + hx of elevated transaminases and bili: followed by Dr. [name].  . Obesity     Past Surgical History:  Procedure Laterality Date  . CARDIAC CATHETERIZATION    . CARDIOVASCULAR STRESS TEST  01/17/2012   Normal stress nuclear study  . CERVICAL DISCECTOMY  1992  . COLONOSCOPY W/ POLYPECTOMY  09/10/2014   Polypectomy x 2: recall 5 yrs (Dr. [name]).  . CORONARY ANGIOPLASTY    . CORONARY ARTERY BYPASS GRAFT  03/2006  . VASECTOMY  1977    No Known Allergies  Current Outpatient  Prescriptions  Medication Sig Dispense Refill  . aspirin 81 MG tablet Take 81 mg by mouth daily.    . cholecalciferol (VITAMIN D) 1000 UNITS tablet Take 1,000 Units by mouth daily.    . fluticasone (FLONASE) 50 MCG/ACT nasal spray Place 2 sprays into both nostrils daily. 16 g 6  . Garlic 8127 MG CAPS Take 1 capsule by mouth 2 (two) times daily.    . metoprolol (LOPRESSOR) 100 MG tablet TAKE 1 TABLET BY MOUTH TWICE DAILY 180 tablet 4  . olmesartan-hydrochlorothiazide (BENICAR HCT) 40-25 MG tablet TAKE 1 TABLET BY MOUTH ONCE DAILY 90 tablet 1  . omega-3 acid ethyl esters (LOVAZA) 1 G capsule TAKE 2 CAPSULES BY MOUTH ONCE DAILY 180 capsule 3   No current facility-administered medications for this visit.     Socially he's married has 3 children 3 grandchildren. Does not routinely exercise. In the past he had been walking 4 miles on a treadmill but has not done this in some time. There is no tobacco or alcohol use.  ROS General: Negative; No fevers, chills, or night sweats;  HEENT: Negative; No changes in vision or hearing, sinus congestion, difficulty swallowing Pulmonary: Negative; No cough, wheezing, shortness of breath, hemoptysis Cardiovascular: Negative; No chest pain, presyncope, syncope, palpitations Positive for intermittent mild leg swelling, which has significantly improved with the diuretic regimen GI: Positive for polyps on colonoscopy; positive for fatty liver GU: Negative; No dysuria, hematuria, or difficulty voiding Musculoskeletal: Negative; no myalgias, joint pain, or weakness Hematologic/Oncology: Negative; no easy bruising, bleeding Endocrine: Positive for metabolic syndrome no heat/cold intolerance; no overt diabetes Neuro: Negative; no changes in balance, headaches Skin: Negative; No rashes or skin lesions Psychiatric: Negative; No behavioral problems, depression Sleep: Negative; No snoring, daytime sleepiness, hypersomnolence, bruxism, restless legs, hypnogognic  hallucinations, no cataplexy Other comprehensive 14 point system review is negative.   PE BP (!) 142/79   Pulse 63   Ht 5' 8.5" (1.74 m)   Wt 261 lb 12.8 oz (118.8 kg)   BMI 39.23 kg/m   Repeat blood pressure 124/78 when taken by me.  Wt Readings from Last 3 Encounters:  01/20/16 261 lb 12.8 oz (118.8 kg)  12/02/15 263 lb 6.4 oz (119.5 kg)  07/20/15 255 lb (115.7 kg)   General: Alert, oriented, no distress.  Skin: normal turgor, no rashes HEENT: Normocephalic, atraumatic. Pupils round and reactive; sclera anicteric;no lid lag. No xanthelasmas. No lid lag. Nose without nasal septal hypertrophy Mouth/Parynx benign; Mallinpatti scale 3 Neck: Thick neck; No JVD, no carotid bruits normal carotid upstroke. Lungs: clear to ausculatation and percussion; no wheezing or rales Heart: RRR, s1 s2 normal 1/6 systolic murmur; no S3 or S4 gallop.  No rubs, thrills, or heaves Chest wall: Nontender to palpation Abdomen: Moderately severe abdominal obesity. soft, nontender; no  hepatosplenomehaly, BS+; abdominal aorta nontender and not dilated by palpation. Back: No CVA tenderness Pulses 2+ Extremities: Trivial pretibial edema bilaterally above the sock line; no clubbing cyanosis, Homan's sign negative  Neurologic: grossly nonfocal Psychological: Normal affect and mood  ECG (independently read by me): Normal sinus rhythm at 63 bpm with first-degree AV block.  PR interval 222 ms.  Incomplete right bundle branch block.  June 2017 ECG (independently read by me): Normal sinus rhythm at 65 bpm with first-degree AV block with a PR interval of 214 ms.  Mild RV conduction delay.  Poor anterior R-wave progression V1 through V4.  December 2016 ECG (independently read by me): Normal sinus rhythm at 66 bpm.  First-degree AV block with a PR interval at 224 ms.  June 2016 ECG (independently read by me): Normal sinus rhythm at 66. First-degree block with PR interval 28 ms.  Incomplete right bundle branch  block.  QRS complex V1-3.   October 2015 ECG (independently read by me): Sinus rhythm with first-degree AV block.  Incomplete right bundle branch block.  QTc interval 447 ms; first-degree AV block with a PR interval of 220 ms  03/19/2013 ECG (independently read by me): Normal sinus rhythm at 69 beats per minute. No ectopy. Borderline first-degree block with a PR interval of 216 ms.  Prior ECG of July 18,2014:  Sinus rhythm at 57 beats per minute. Borderline first-degree block with a period of 1214 ms. No significant ST abnormalities.  LABS: BMP Latest Ref Rng & Units 12/02/2015 07/20/2015 01/15/2015  Glucose 70 - 99 mg/dL 101(H) 105(H) 100(H)  BUN 6 - 23 mg/dL 13 8 13   Creatinine 0.40 - 1.50 mg/dL 0.98 0.87 0.89  Sodium 135 - 145 mEq/L 139 137 138  Potassium 3.5 - 5.1 mEq/L 4.7 4.2 4.3  Chloride 96 - 112 mEq/L 102 102 100  CO2 19 - 32 mEq/L 29 25 29   Calcium 8.4 - 10.5 mg/dL 9.8 9.3 9.5   Hepatic Function Latest Ref Rng & Units 12/02/2015 07/20/2015 04/14/2015  Total Protein 6.0 - 8.3 g/dL 7.3 7.2 7.6  Albumin 3.5 - 5.2 g/dL 3.5 3.6 3.3(L)  AST 0 - 37 U/L 71(H) 82(H) 55(H)  ALT 0 - 53 U/L 42 53(H) 40  Alk Phosphatase 39 - 117 U/L 93 81 86  Total Bilirubin 0.2 - 1.2 mg/dL 3.8(H) 2.6(H) 2.8(H)  Bilirubin, Direct <=0.2 mg/dL - - 0.5(H)   CBC Latest Ref Rng & Units 07/20/2015 01/15/2015 07/15/2014  WBC 3.8 - 10.8 K/uL 7.0 8.3 8.6  Hemoglobin 13.2 - 17.1 g/dL 14.8 15.2 15.0  Hematocrit 38.5 - 50.0 % 42.8 45.4 43.8  Platelets 140 - 400 K/uL 149 181 180   Lab Results  Component Value Date   MCV 87.0 07/20/2015   MCV 88.2 01/15/2015   MCV 87.4 07/15/2014   Lab Results  Component Value Date   TSH 2.11 07/20/2015   Lab Results  Component Value Date   HGBA1C 6.6 (H) 12/02/2015   Lipid Panel     Component Value Date/Time   CHOL 231 (H) 07/20/2015 0949   CHOL 135 11/29/2013 0940   TRIG 103 07/20/2015 0949   TRIG 177 (H) 11/29/2013 0940   HDL 33 (L) 07/20/2015 0949   HDL 34 (L)  11/29/2013 0940   CHOLHDL 7.0 (H) 07/20/2015 0949   VLDL 21 07/20/2015 0949   LDLCALC 177 (H) 07/20/2015 0949   LDLCALC 66 11/29/2013 0940     RADIOLOGY: No results found.  IMPRESSION:  1. CAD in  native artery   2. Essential hypertension   3. Abdominal aortic aneurysm (AAA) without rupture ([place])   4. Metabolic syndrome   5. Mixed hyperlipidemia   6. Elevated LFTs     ASSESSMENT AND PLAN: Mr. Sondgeroth is a 65 year old gentleman with a history of moderate obesity, who is  almost 10 years status post  CABG revascularization surgery x4 in February 2008 . His nuclear perfusion study in December 2013 continued to show fairly normal perfusion without scar or ischemia. He has significant mixed hyperlipidemia and in the past has been documented to have a preponderance of small dense LDL particles and significantly increased triglycerides.  He had tolerated combination therapy with Niaspan 2000 mg and Crestor 20 mg without side effects. He developed  LFT elevation and his statin and niacin were discontinued.   Subsequent LFT studies have shown improvement but still with residual mild  transaminase elevation.   I suspect he may have hepatostearrhea contributing to his LFT elevation.  His blood pressure today is stable on Benicar HCT 40/25 and metoprolol 100 mg twice a day.   I had last seen him, I had discontinued clonidine since his blood pressure was trending low.  His blood pressure today on repeat by me was excellent at 124/78.  Approaching almost morbid obesity with a BMI of 39.23.  I have set a weight goal of at least 50 pounds weight loss which would get him under the obesity threshold.  We discussed today 5 pounds weight loss on a monthly basis so that in 4 months he will have lost 20 pounds.   He has documented small simple cysts in his kidneys.  He is also been found to have a small abdominal aortic aneurysm, measuring 3.5 cm noted on CT imaging in January 2015 and a subsequent ultrasound showed  reduced AAA dimensions at 3.03.1.  He is not having any anginal symptoms.  His ECG remained stable.  There is first-degree AV block.  His ventricular rate is controlled on metoprolol tartrate 100 mg twice a day. His primary physician will be rechecking laboratory.  If he is unable to take statin therapy or additional medicine for hyperlipidemia, he may be a candidate for PCSK9 inhibition.  I will see him in 4 months for cardiology reevaluation..   Time spent: 25 minutes   Troy Sine, MD, [facility]  01/22/2016 8:02 AM

## 2016-01-25 DIAGNOSIS — H5203 Hypermetropia, bilateral: Secondary | ICD-10-CM | POA: Diagnosis not present

## 2016-01-25 DIAGNOSIS — H524 Presbyopia: Secondary | ICD-10-CM | POA: Diagnosis not present

## 2016-02-08 DIAGNOSIS — K8020 Calculus of gallbladder without cholecystitis without obstruction: Secondary | ICD-10-CM

## 2016-02-08 HISTORY — DX: Calculus of gallbladder without cholecystitis without obstruction: K80.20

## 2016-03-10 ENCOUNTER — Other Ambulatory Visit: Payer: Self-pay | Admitting: Cardiovascular Disease

## 2016-03-10 MED FILL — OLMESARTAN-HCTZ 40-25 MG TA: 40-25 | 90 days supply | Qty: 90 | Fill #0

## 2016-03-24 ENCOUNTER — Other Ambulatory Visit: Payer: Self-pay | Admitting: Cardiovascular Disease

## 2016-03-24 MED FILL — METOPROLOL TARTRATE 100 MG: 100 | 90 days supply | Qty: 180 | Fill #1

## 2016-03-24 MED FILL — OMEGA-3 ETHYL ESTERS 1 GM C: 1 | 90 days supply | Qty: 180 | Fill #0

## 2016-03-24 NOTE — Telephone Encounter (Signed)
Rx(s) sent to pharmacy electronically.  

## 2016-04-01 ENCOUNTER — Encounter: Payer: Self-pay | Admitting: Family Medicine

## 2016-04-01 ENCOUNTER — Ambulatory Visit (INDEPENDENT_AMBULATORY_CARE_PROVIDER_SITE_OTHER): Payer: 59 | Admitting: Family Medicine

## 2016-04-01 VITALS — BP 138/92 | HR 60 | Temp 98.0°F | Resp 16 | Wt 262.0 lb

## 2016-04-01 DIAGNOSIS — E7800 Pure hypercholesterolemia, unspecified: Secondary | ICD-10-CM | POA: Diagnosis not present

## 2016-04-01 DIAGNOSIS — I10 Essential (primary) hypertension: Secondary | ICD-10-CM

## 2016-04-01 DIAGNOSIS — Z23 Encounter for immunization: Secondary | ICD-10-CM | POA: Diagnosis not present

## 2016-04-01 DIAGNOSIS — E118 Type 2 diabetes mellitus with unspecified complications: Secondary | ICD-10-CM

## 2016-04-01 LAB — COMPREHENSIVE METABOLIC PANEL
ALK PHOS: 84 U/L (ref 39–117)
ALT: 41 U/L (ref 0–53)
AST: 62 U/L — ABNORMAL HIGH (ref 0–37)
Albumin: 3.4 g/dL — ABNORMAL LOW (ref 3.5–5.2)
BUN: 16 mg/dL (ref 6–23)
CALCIUM: 9 mg/dL (ref 8.4–10.5)
CO2: 26 meq/L (ref 19–32)
Chloride: 104 mEq/L (ref 96–112)
Creatinine, Ser: 1.22 mg/dL (ref 0.40–1.50)
GFR: 63.06 mL/min (ref >60.00)
GLUCOSE: 111 mg/dL — AB (ref 70–99)
POTASSIUM: 4.4 meq/L (ref 3.5–5.1)
Sodium: 138 mEq/L (ref 135–145)
TOTAL PROTEIN: 7 g/dL (ref 6.0–8.3)
Total Bilirubin: 3.6 mg/dL — ABNORMAL HIGH (ref 0.2–1.2)

## 2016-04-01 LAB — LIPID PANEL
Cholesterol: 204 mg/dL — ABNORMAL HIGH (ref 0–200)
HDL: 29.7 mg/dL — AB (ref >39.00)
LDL Cholesterol: 151 mg/dL — ABNORMAL HIGH (ref 0–99)
NONHDL: 174.56
TRIGLYCERIDES: 119 mg/dL (ref 0.0–149.0)
Total CHOL/HDL Ratio: 7
VLDL: 23.8 mg/dL (ref 0.0–40.0)

## 2016-04-01 LAB — HEMOGLOBIN A1C: HEMOGLOBIN A1C: 6.7 % — AB (ref 4.6–6.5)

## 2016-04-01 NOTE — Addendum Note (Signed)
Addended by: Gordy Councilman on: 04/01/2016 10:23 AM   Modules accepted: Orders

## 2016-04-01 NOTE — Progress Notes (Signed)
OFFICE VISIT  04/01/2016   CC:  Chief Complaint  Patient presents with  . Follow-up    diabetes, hyperlipidemia   HPI:    Patient is a 66 y.o.  male who presents for 4 mo f/u DM 2, HTN, Hyperlipidemia. No acute complaints.  DM 2: no home monitoring being done.  No burning, tingling, or numbness in feet.  HLD: was on crestor--was taken off this due NASH.  HTN: monitors bp regularly, says always 130s/80s.  ROS: no CP, no SOB, no polyuria/polydipsia, no abd pain, no jaundice, no HA or vision c/o's.  Past Medical History:  Diagnosis Date  . AAA (abdominal aortic aneurysm) ([place]) 03/2014   3.2 cm on MR abd.  F/u aortic u/s 06/2014 showed 3.0 x 3.1 cm AAA: recheck 2 yrs recommended (followed by cardiologist)  . Bilateral renal cysts    simple (03/2014 MRI)  . CAD (coronary artery disease)   . Diabetes mellitus with complication ([place]) 28/4132   A1c 6.8%  . Hyperlipemia, mixed   . Hypertension   . Microscopic hematuria    Eval unremarkable by Dr. Eulogio Ditch.  [name] NASH (nonalcoholic steatohepatitis)    Fatty liver on MR abd 03/2014, + hx of elevated transaminases and bili: followed by Dr. [name].  . Obesity     Past Surgical History:  Procedure Laterality Date  . CARDIAC CATHETERIZATION    . CARDIOVASCULAR STRESS TEST  01/17/2012   Normal stress nuclear study  . CERVICAL DISCECTOMY  1992  . COLONOSCOPY W/ POLYPECTOMY  09/10/2014   Polypectomy x 2: recall 5 yrs (Dr. [name]).  . CORONARY ANGIOPLASTY    . CORONARY ARTERY BYPASS GRAFT  03/2006  . VASECTOMY  1977    Outpatient Medications Prior to Visit  Medication Sig Dispense Refill  . aspirin 81 MG tablet Take 81 mg by mouth daily.    . cholecalciferol (VITAMIN D) 1000 UNITS tablet Take 1,000 Units by mouth daily.    . fluticasone (FLONASE) 50 MCG/ACT nasal spray Place 2 sprays into both nostrils daily. 16 g 6  . Garlic 4401 MG CAPS Take 1 capsule by mouth 2 (two) times daily.    . metoprolol (LOPRESSOR) 100 MG tablet TAKE 1  TABLET BY MOUTH TWICE DAILY 180 tablet 4  . olmesartan-hydrochlorothiazide (BENICAR HCT) 40-25 MG tablet TAKE 1 TABLET BY MOUTH ONCE DAILY 90 tablet 2  . omega-3 acid ethyl esters (LOVAZA) 1 g capsule TAKE 2 CAPSULES BY MOUTH ONCE DAILY 180 capsule 3   No facility-administered medications prior to visit.     No Known Allergies  ROS As per HPI  PE: Blood pressure (!) 138/92, pulse 60, temperature 98 F (36.7 C), temperature source Oral, resp. rate 16, weight 262 lb (118.8 kg), SpO2 96 %. Body mass index is 39.26 kg/m.  Gen: Alert, well appearing.  Patient is oriented to person, place, time, and situation. AFFECT: pleasant, lucid thought and speech. Foot exam - bilateral normal; no swelling, tenderness or skin or vascular lesions. Color and temperature is normal. Sensation is intact. Peripheral pulses are palpable. Toenails are normal.   LABS:    Chemistry      Component Value Date/Time   NA 139 12/02/2015 1020   K 4.7 12/02/2015 1020   CL 102 12/02/2015 1020   CO2 29 12/02/2015 1020   BUN 13 12/02/2015 1020   CREATININE 0.98 12/02/2015 1020   CREATININE 0.87 07/20/2015 0949      Component Value Date/Time   CALCIUM 9.8 12/02/2015 1020  ALKPHOS 93 12/02/2015 1020   AST 71 (H) 12/02/2015 1020   ALT 42 12/02/2015 1020   BILITOT 3.8 (H) 12/02/2015 1020     Lab Results  Component Value Date   WBC 7.0 07/20/2015   HGB 14.8 07/20/2015   HCT 42.8 07/20/2015   MCV 87.0 07/20/2015   PLT 149 07/20/2015   Lab Results  Component Value Date   CHOL 231 (H) 07/20/2015   HDL 33 (L) 07/20/2015   LDLCALC 177 (H) 07/20/2015   TRIG 103 07/20/2015   CHOLHDL 7.0 (H) 07/20/2015   IMPRESSION AND PLAN:  No problem-specific Assessment & Plan notes found for this encounter.  1) DM 2: no home monitoring.  No diabetic diet. HbA1c today.   Feet exam normal today. UTD on eye exam. Prevnar 13 given today.  2) HTN; The current medical regimen is effective;  continue present plan and  medications. Lytes/cr today.  3) Hyperlipidemia: unable to treat with meds due to pt having significant NASH. Encouraged diet/exercise/wt loss.  An After Visit Summary was printed and given to the patient.  FOLLOW UP: Return in about 3 months (around 06/29/2016) for annual CPE (fasting).  Signed:  Crissie Sickles, MD           04/01/2016

## 2016-04-08 ENCOUNTER — Other Ambulatory Visit: Payer: Self-pay | Admitting: *Deleted

## 2016-04-08 MED ORDER — AMLODIPINE BESYLATE 5 MG PO TABS: 5.0000 mg | ORAL_TABLET | Freq: Every day | ORAL | 3 refills | Status: DC

## 2016-04-08 MED ORDER — OLMESARTAN MEDOXOMIL 40 MG PO TABS: 40.0000 mg | ORAL_TABLET | Freq: Every day | ORAL | 3 refills | Status: DC

## 2016-04-08 MED FILL — OLMESARTAN MEDOXOMIL 40 MG: 40 | 30 days supply | Qty: 30 | Fill #0

## 2016-04-08 MED FILL — AMLODIPINE BESYLATE 5 MG TA: 5 | 30 days supply | Qty: 30 | Fill #0

## 2016-04-21 ENCOUNTER — Encounter: Payer: Self-pay | Admitting: Cardiovascular Disease

## 2016-04-27 ENCOUNTER — Ambulatory Visit (INDEPENDENT_AMBULATORY_CARE_PROVIDER_SITE_OTHER): Payer: 59 | Admitting: Cardiovascular Disease

## 2016-04-27 ENCOUNTER — Encounter: Payer: Self-pay | Admitting: Cardiovascular Disease

## 2016-04-27 VITALS — BP 120/62 | HR 61 | Ht 69.0 in | Wt 274.8 lb

## 2016-04-27 DIAGNOSIS — Z79899 Other long term (current) drug therapy: Secondary | ICD-10-CM | POA: Diagnosis not present

## 2016-04-27 DIAGNOSIS — E118 Type 2 diabetes mellitus with unspecified complications: Secondary | ICD-10-CM | POA: Diagnosis not present

## 2016-04-27 DIAGNOSIS — E8881 Metabolic syndrome: Secondary | ICD-10-CM

## 2016-04-27 DIAGNOSIS — E782 Mixed hyperlipidemia: Secondary | ICD-10-CM

## 2016-04-27 DIAGNOSIS — I2510 Atherosclerotic heart disease of native coronary artery without angina pectoris: Secondary | ICD-10-CM

## 2016-04-27 DIAGNOSIS — R600 Localized edema: Secondary | ICD-10-CM

## 2016-04-27 DIAGNOSIS — E8770 Fluid overload, unspecified: Secondary | ICD-10-CM | POA: Diagnosis not present

## 2016-04-27 DIAGNOSIS — R7989 Other specified abnormal findings of blood chemistry: Secondary | ICD-10-CM

## 2016-04-27 DIAGNOSIS — I10 Essential (primary) hypertension: Secondary | ICD-10-CM | POA: Diagnosis not present

## 2016-04-27 DIAGNOSIS — R945 Abnormal results of liver function studies: Secondary | ICD-10-CM

## 2016-04-27 MED ORDER — METFORMIN HCL 500 MG PO TABS: 500.0000 mg | ORAL_TABLET | Freq: Two times a day (BID) | ORAL | 6 refills | Status: DC

## 2016-04-27 MED ORDER — TORSEMIDE 20 MG PO TABS: 20.0000 mg | ORAL_TABLET | Freq: Every day | ORAL | 6 refills | Status: DC

## 2016-04-27 MED ORDER — OLMESARTAN MEDOXOMIL 20 MG PO TABS: 20.0000 mg | ORAL_TABLET | Freq: Every day | ORAL | 6 refills | Status: DC

## 2016-04-27 NOTE — Patient Instructions (Addendum)
Medication Instructions:   STOP Benicar-HCT. This has been replaced with plain Benicar 20 mg daily.  Start new prescription for metformin ( glucophage).  Start torsemide 20 mg daily.  These prescriptions has been sent to your pharmacy.  Labwork:  CMET, BNP, TSH lab slips provided to you today.  Testing/Procedures:  Your physician has requested that you have an echocardiogram. Echocardiography is a painless test that uses sound waves to create images of your heart. It provides your doctor with information about the size and shape of your heart and how well your heart's chambers and valves are working. This procedure takes approximately one hour. There are no restrictions for this procedure.  Your physician has requested that you have a lexiscan myoview. For further information please visit [email or web address]. Please follow instruction sheet, as given.    Follow-Up:  Your physician recommends that you schedule a follow-up appointment in 6 weeks with Dr Claiborne Billings.  Any Other Special Instructions Will Be Listed Below (If Applicable).

## 2016-04-27 NOTE — Progress Notes (Signed)
Patient ID: William [name], male   DOB: 05/11/1950, 66 y.o.   MRN: [id number]    HPI: [name] is a 66 y.o. male who presents to the office today for a 4-month cardiology evaluation.  [name] has known CAD and underwent CABG revascularization surgery with a LIMA to the LAD, SVG to the RCA, SVG to the circumflex vessel in February 2008.  He has a history of obesity, metabolic syndrome, and mixed hyperlipidemia with preponderance of small dense LDL some particles and increased TG for which he has been on combination therapy.  In December 2013 a nuclear perfusion study was unchanged from 2 years previously and did not show significant scar or ischemia. In January 2014,  I adjusted his antihypertensive medications and started  Benicar HCT 40/25 for optimal blood pressure control with increase diuretic regimen and took him off his Avalide. He has felt improved on this therapy.  According to his wife, his blood pressure at home typically is in the 120s systolially and in the 70s diastolically.  He has a history of moderate obesity.  He does not routinely exercise.  He also does note some dietary discretion. He has noticed some mild leg swelling intermittently. He denies recent anginal symptoms. He denies palpitations.   In 2016 he underwent a urologic evaluation for gross hematuria. A CT of his abdomen and pelvis did not reveal any evidence for renal calculi or hydronephrosis or evidence for renal calculi or dilatation. He did have small benign renal cysts. He was also noted a 3.5 cm infrarenal abdominal aortic aneurysm. There was also a suggestion of fatty infiltration of his liver.  He underwent a follow-up abdominal aortic ultrasound on 06/27/2014.  This showed his distal aorta measuring 3.0 x 3.1 cm and he had mild amount of atherosclerosis.  He underwent a  colonoscopy by Dr. Jothi Mann and was found to have 2 benign polyps.  He has been off  Crestor and niacin since last year due to elevation of  his liver enzymes. There is family history for liver disease in that his mother died from liver disease and his brother had a liver transplant.  In July 2016, his AST was 51 and ALT was 54.  Subsequent blood work by Dr. Mann revealed his AST increased at 87 and ALT 68.  On 01/15/2015, his AST was 85 and ALT 64.  At that time, total cholesterol was 244, triglycerides 149, HDL 30, and his LDL cholesterol on statin therapy had risen to 184.   WhenI last saw him he denied chest tightness.  He was not been successful with weight loss and in fact has gained some weight.  Labs checked by his primary M.D.showed a hemoglobin A1c was 6.6.  LFTs had slightly improved but AST was still elevated at 71 and his ALT was now normal.   As I last saw him, he admits to fatigability.  He had noticed significant swelling and had stopped amlodipine was concerned that this was also being caused by Benicar..  He has been intolerant to statin therapy including Pravachol, Lipitor, and Crestor.  He has been monitoring his blood pressures at home and his systolic pressure has ranged from 100 - 126 with diastolics in the 60s.  He denies shortness of breath.  He is unaware of palpitations.  He denies PND, orthopnea.    He presents for reevaluation.  Past Medical History:  Diagnosis Date  . AAA (abdominal aortic aneurysm) ([facility]) 03/2014   3.2 cm on   MR abd.  F/u aortic u/s 06/2014 showed 3.0 x 3.1 cm AAA: recheck 2 yrs recommended (followed by cardiologist)  . Bilateral renal cysts    simple (03/2014 MRI)  . CAD (coronary artery disease)   . Diabetes mellitus with complication ([place]) 67/5366   A1c 6.8%  . Hyperlipemia, mixed   . Hypertension    Cr bump 04/01/16 so I changed benicar-hct to benicar plain and added amlodipine 5 mg.  . Microscopic hematuria    Eval unremarkable by Dr. Eulogio Ditch.  [name] NASH (nonalcoholic steatohepatitis)    Fatty liver on MR abd 03/2014, + hx of elevated transaminases and bili: followed by Dr. [name].  .  Obesity     Past Surgical History:  Procedure Laterality Date  . CARDIAC CATHETERIZATION    . CARDIOVASCULAR STRESS TEST  01/17/2012   Normal stress nuclear study  . CERVICAL DISCECTOMY  1992  . COLONOSCOPY W/ POLYPECTOMY  09/10/2014   Polypectomy x 2: recall 5 yrs (Dr. [name]).  . CORONARY ANGIOPLASTY    . CORONARY ARTERY BYPASS GRAFT  03/2006  . VASECTOMY  1977    No Known Allergies  Current Outpatient Prescriptions  Medication Sig Dispense Refill  . aspirin 81 MG tablet Take 81 mg by mouth daily.    . cholecalciferol (VITAMIN D) 1000 UNITS tablet Take 1,000 Units by mouth daily.    . fluticasone (FLONASE) 50 MCG/ACT nasal spray Place 2 sprays into both nostrils daily. 16 g 6  . fluticasone (FLONASE) 50 MCG/ACT nasal spray Place 2 sprays into both nostrils daily as needed for allergies or rhinitis.    . Garlic 4403 MG CAPS Take 1 capsule by mouth 2 (two) times daily.    . metoprolol (LOPRESSOR) 100 MG tablet TAKE 1 TABLET BY MOUTH TWICE DAILY 180 tablet 4  . omega-3 acid ethyl esters (LOVAZA) 1 g capsule TAKE 2 CAPSULES BY MOUTH ONCE DAILY 180 capsule 3  . metFORMIN (GLUCOPHAGE) 500 MG tablet Take 1 tablet (500 mg total) by mouth 2 (two) times daily with a meal. 60 tablet 6  . olmesartan (BENICAR) 20 MG tablet Take 1 tablet (20 mg total) by mouth daily. 30 tablet 6  . torsemide (DEMADEX) 20 MG tablet Take 1 tablet (20 mg total) by mouth daily. 30 tablet 6   No current facility-administered medications for this visit.     Socially he's married has 3 children 3 grandchildren. Does not routinely exercise. In the past he had been walking 4 miles on a treadmill but has not done this in some time. There is no tobacco or alcohol use.  ROS General: Negative; No fevers, chills, or night sweats;  HEENT: Negative; No changes in vision or hearing, sinus congestion, difficulty swallowing Pulmonary: Negative; No cough, wheezing, shortness of breath, hemoptysis Cardiovascular: Negative; No  chest pain, presyncope, syncope, palpitations Positive for Increasing leg swelling GI: Positive for polyps on colonoscopy; positive for fatty liver GU: Negative; No dysuria, hematuria, or difficulty voiding Musculoskeletal: Negative; no myalgias, joint pain, or weakness Hematologic/Oncology: Negative; no easy bruising, bleeding Endocrine: Positive for metabolic syndrome/mild diabetes; no heat/cold intolerance; Neuro: Negative; no changes in balance, headaches Skin: Negative; No rashes or skin lesions Psychiatric: Negative; No behavioral problems, depression Sleep: Negative; No snoring, daytime sleepiness, hypersomnolence, bruxism, restless legs, hypnogognic hallucinations, no cataplexy Other comprehensive 14 point system review is negative.   PE BP 120/62   Pulse 61   Ht 5' 9" (1.753 m)   Wt 274 lb 12.8 oz (124.6 kg)  BMI 40.58 kg/m   Repeat blood pressure 106/70 when taken by me.  Wt Readings from Last 3 Encounters:  04/27/16 274 lb 12.8 oz (124.6 kg)  04/01/16 262 lb (118.8 kg)  01/20/16 261 lb 12.8 oz (118.8 kg)   General: Alert, oriented, no distress.  Skin: normal turgor, no rashes HEENT: Normocephalic, atraumatic. Pupils round and reactive; sclera anicteric;no lid lag. No xanthelasmas. No lid lag. Nose without nasal septal hypertrophy Mouth/Parynx benign; Mallinpatti scale 3 Neck: Thick neck; No JVD, no carotid bruits normal carotid upstroke. Lungs: clear to ausculatation and percussion; no wheezing or rales Heart: RRR, s1 s2 normal 1/6 systolic murmur; no S3 or S4 gallop.  No rubs, thrills, or heaves Chest wall: Nontender to palpation Abdomen: Moderately severe abdominal obesity. soft, nontender; no hepatosplenomehaly, BS+; abdominal aorta nontender and not dilated by palpation. Back: No CVA tenderness Pulses 2+ Extremities: Trivial pretibial edema bilaterally above the sock line; no clubbing cyanosis, Homan's sign negative  Neurologic: grossly  nonfocal Psychological: Normal affect and mood  ECG (independently read by me): Normal sinus rhythm at 61 bpm.  First degree AV block with a PR interval at 224 ms.  Incomplete right bundle branch block.  December 2017 ECG (independently read by me): Normal sinus rhythm at 63 bpm with first-degree AV block.  PR interval 222 ms.  Incomplete right bundle branch block.  June 2017 ECG (independently read by me): Normal sinus rhythm at 65 bpm with first-degree AV block with a PR interval of 214 ms.  Mild RV conduction delay.  Poor anterior R-wave progression V1 through V4.  December 2016 ECG (independently read by me): Normal sinus rhythm at 66 bpm.  First-degree AV block with a PR interval at 224 ms.  June 2016 ECG (independently read by me): Normal sinus rhythm at 66. First-degree block with PR interval 28 ms.  Incomplete right bundle branch block.  QRS complex V1-3.   October 2015 ECG (independently read by me): Sinus rhythm with first-degree AV block.  Incomplete right bundle branch block.  QTc interval 447 ms; first-degree AV block with a PR interval of 220 ms  03/19/2013 ECG (independently read by me): Normal sinus rhythm at 69 beats per minute. No ectopy. Borderline first-degree block with a PR interval of 216 ms.  Prior ECG of July 18,2014:  Sinus rhythm at 57 beats per minute. Borderline first-degree block with a period of 1214 ms. No significant ST abnormalities.  LABS: BMP Latest Ref Rng & Units 04/01/2016 12/02/2015 07/20/2015  Glucose 70 - 99 mg/dL 111(H) 101(H) 105(H)  BUN 6 - 23 mg/dL 16 13 8  Creatinine 0.40 - 1.50 mg/dL 1.22 0.98 0.87  Sodium 135 - 145 mEq/L 138 139 137  Potassium 3.5 - 5.1 mEq/L 4.4 4.7 4.2  Chloride 96 - 112 mEq/L 104 102 102  CO2 19 - 32 mEq/L 26 29 25  Calcium 8.4 - 10.5 mg/dL 9.0 9.8 9.3   Hepatic Function Latest Ref Rng & Units 04/01/2016 12/02/2015 07/20/2015  Total Protein 6.0 - 8.3 g/dL 7.0 7.3 7.2  Albumin 3.5 - 5.2 g/dL 3.4(L) 3.5 3.6  AST 0 - 37  U/L 62(H) 71(H) 82(H)  ALT 0 - 53 U/L 41 42 53(H)  Alk Phosphatase 39 - 117 U/L 84 93 81  Total Bilirubin 0.2 - 1.2 mg/dL 3.6(H) 3.8(H) 2.6(H)  Bilirubin, Direct <=0.2 mg/dL - - -   CBC Latest Ref Rng & Units 07/20/2015 01/15/2015 07/15/2014  WBC 3.8 - 10.8 K/uL 7.0 8.3 8.6  Hemoglobin   13.2 - 17.1 g/dL 14.8 15.2 15.0  Hematocrit 38.5 - 50.0 % 42.8 45.4 43.8  Platelets 140 - 400 K/uL 149 181 180   Lab Results  Component Value Date   MCV 87.0 07/20/2015   MCV 88.2 01/15/2015   MCV 87.4 07/15/2014   Lab Results  Component Value Date   TSH 2.11 07/20/2015   Lab Results  Component Value Date   HGBA1C 6.7 (H) 04/01/2016   Lipid Panel     Component Value Date/Time   CHOL 204 (H) 04/01/2016 0952   CHOL 135 11/29/2013 0940   TRIG 119.0 04/01/2016 0952   TRIG 177 (H) 11/29/2013 0940   HDL 29.70 (L) 04/01/2016 0952   HDL 34 (L) 11/29/2013 0940   CHOLHDL 7 04/01/2016 0952   VLDL 23.8 04/01/2016 0952   LDLCALC 151 (H) 04/01/2016 0952   LDLCALC 66 11/29/2013 0940     RADIOLOGY: No results found.  IMPRESSION:  1. CAD in native artery   2. Essential hypertension   3. Mixed hyperlipidemia   4. Metabolic syndrome   5. Type 2 diabetes mellitus with complication, without long-term current use of insulin ([facility])   6. Hypervolemia, unspecified hypervolemia type   7. Medication management   8. Bilateral lower extremity edema   9. Elevated liver function tests     ASSESSMENT AND PLAN: Mr. Mccutchen is a 65-year-old gentleman with a history of moderate obesity, who is 10 years status post CABG revascularization surgery x4 in February 2008 . His nuclear perfusion study in December 2013 continued to show fairly normal perfusion without scar or ischemia. He has significant mixed hyperlipidemia and in the past has been documented to have a preponderance of small dense LDL particles and significantly increased triglycerides. Remotely he ha initially tolerated combination therapy with Niaspan  2000 mg and Crestor 20 mg without side effects. He developed  LFT elevation and his statin and niacin were discontinued.   Subsequent LFT studies have shown improvement but still with residual mild  transaminase elevation.   I suspect he may have hepatostearrhea contributing to his LFT elevation.  He is also been found to have a small abdominal aortic aneurysm, measuring 3.5 cm noted on CT imaging in January 2015 and a subsequent ultrasound showed reduced AAA dimensions at 3.03.1.  He is not having any anginal symptoms.  He recently has developed significant lower extremity edema below the knee, which today is 2+ pitting.  I have recommended that he discontinue the Benicar HCT 40/25 and in its place I will start a reduced dose of Benicar 20 mg but will also start torsemide 20 mg daily for more effective diuresis.  I have reviewed his recent laboratory.  I will recheck a CMP, brain natruretic peptide, and TSH.  Even though he was never told of being diabetic, he has had consistent elevation of hemoglobin A1c's over the past 3 years over the 6.5 range, which clearly places him in the diabetic category. His HbA1c level has not improved without treatment; I have recommended initiation of metformin 500 mg twice a day in light of his continued elevation of hemoglobin A1c.  He has had resistant issues with mild to moderate elevated LFTs , but has not been able to tolerate Pravachol, Lipitor, and Crestor.  I had a long discussion with he and his wife regarding outcome trial data with Repatha.  His most recent lipid study showed an LDL cholesterol at 151, which places him at significant increased risk for recurrent MI or stroke.    We will try to initiate the process for approval  with Repatha 140 mg subcutaneous every 2 weeks.  He is 10 years status post CABG surgery.  It is been over 4 years since his last nuclear perfusion study and I am scheduling him for a Lexiscan Myoview to further evaluate his CAD and graft patency.   I will schedule him for an echo Doppler study to assess both systolic and diastolic function. I will see him back in the office in 6 weeks for follow-up evaluation.  Time spent: 40 minutes   Thomas A. Kelly, MD, FACC  05/03/2016 5:12 PM    

## 2016-04-29 ENCOUNTER — Telehealth (HOSPITAL_COMMUNITY): Payer: Self-pay

## 2016-04-29 NOTE — Telephone Encounter (Signed)
Encounter complete. 

## 2016-05-04 ENCOUNTER — Ambulatory Visit (HOSPITAL_COMMUNITY)
Admission: RE | Admit: 2016-05-04 | Discharge: 2016-05-04 | Disposition: A | Discharge status: Home / Self Care | Payer: 59 | Source: Ambulatory Visit | Attending: Cardiology | Admitting: Cardiology

## 2016-05-04 DIAGNOSIS — E8881 Metabolic syndrome: Secondary | ICD-10-CM | POA: Diagnosis not present

## 2016-05-04 DIAGNOSIS — I2510 Atherosclerotic heart disease of native coronary artery without angina pectoris: Secondary | ICD-10-CM | POA: Diagnosis not present

## 2016-05-04 DIAGNOSIS — I10 Essential (primary) hypertension: Secondary | ICD-10-CM | POA: Diagnosis not present

## 2016-05-04 DIAGNOSIS — E782 Mixed hyperlipidemia: Secondary | ICD-10-CM | POA: Diagnosis not present

## 2016-05-04 DIAGNOSIS — E8770 Fluid overload, unspecified: Secondary | ICD-10-CM | POA: Diagnosis not present

## 2016-05-04 DIAGNOSIS — Z79899 Other long term (current) drug therapy: Secondary | ICD-10-CM | POA: Diagnosis not present

## 2016-05-04 LAB — MYOCARDIAL PERFUSION IMAGING
CHL CUP RESTING HR STRESS: 61 {beats}/min
LV sys vol: 58 mL
LVDIAVOL: 170 mL (ref 62–150)
NUC STRESS TID: 0.98
Peak HR: 69 {beats}/min
SDS: 1
SRS: 0
SSS: 1

## 2016-05-04 LAB — COMPREHENSIVE METABOLIC PANEL
ALBUMIN: 3.1 g/dL — AB (ref 3.6–5.1)
ALT: 32 U/L (ref 9–46)
AST: 43 U/L — ABNORMAL HIGH (ref 10–35)
Alkaline Phosphatase: 74 U/L (ref 40–115)
BUN: 17 mg/dL (ref 7–25)
CALCIUM: 8.8 mg/dL (ref 8.6–10.3)
CHLORIDE: 95 mmol/L — AB (ref 98–110)
CO2: 27 mmol/L (ref 20–31)
CREATININE: 1.06 mg/dL (ref 0.70–1.25)
Glucose, Bld: 116 mg/dL — ABNORMAL HIGH (ref 65–99)
Potassium: 3.7 mmol/L (ref 3.5–5.3)
SODIUM: 130 mmol/L — AB (ref 135–146)
TOTAL PROTEIN: 6.9 g/dL (ref 6.1–8.1)
Total Bilirubin: 4.5 mg/dL — ABNORMAL HIGH (ref 0.2–1.2)

## 2016-05-04 MED ORDER — REGADENOSON 0.4 MG/5ML IV SOLN
0.4000 mg | Freq: Once | INTRAVENOUS | Status: AC
  Administered 2016-05-04: 0.4 mg via INTRAVENOUS

## 2016-05-04 MED ORDER — TECHNETIUM TC 99M TETROFOSMIN IV KIT
10.4000 | PACK | Freq: Once | INTRAVENOUS | Status: AC | PRN
  Administered 2016-05-04: 10.4 via INTRAVENOUS
  Filled 2016-05-04: qty 11

## 2016-05-04 MED ORDER — TECHNETIUM TC 99M TETROFOSMIN IV KIT
31.8000 | PACK | Freq: Once | INTRAVENOUS | Status: AC | PRN
  Administered 2016-05-04: 31.8 via INTRAVENOUS
  Filled 2016-05-04: qty 32

## 2016-05-05 ENCOUNTER — Encounter: Payer: Self-pay | Admitting: Family Medicine

## 2016-05-05 ENCOUNTER — Ambulatory Visit (INDEPENDENT_AMBULATORY_CARE_PROVIDER_SITE_OTHER): Payer: 59 | Admitting: Family Medicine

## 2016-05-05 VITALS — BP 125/71 | HR 64 | Temp 98.0°F | Resp 16 | Ht 69.0 in | Wt 266.5 lb

## 2016-05-05 DIAGNOSIS — I9589 Other hypotension: Secondary | ICD-10-CM

## 2016-05-05 DIAGNOSIS — I10 Essential (primary) hypertension: Secondary | ICD-10-CM | POA: Diagnosis not present

## 2016-05-05 DIAGNOSIS — I959 Hypotension, unspecified: Secondary | ICD-10-CM

## 2016-05-05 LAB — TSH: TSH: 1.19 m[IU]/L (ref 0.40–4.50)

## 2016-05-05 LAB — BRAIN NATRIURETIC PEPTIDE: BRAIN NATRIURETIC PEPTIDE: 574.6 pg/mL — AB (ref <100)

## 2016-05-05 NOTE — Progress Notes (Signed)
OFFICE VISIT  05/08/2016   CC:  Chief Complaint  Patient presents with  . Follow-up    HTN, pt is fasting.    HPI:    Patient is a 66 y.o. Caucasian male who presents for 5 week f/u HTN. Recent stress test at cardiologist was NEG--done b/c he complained of vague tingling sensation in lots of areas, generalized fatigue, some SOB. home bp's low.  At cardiologist's visit 04/27/16, Dr. Claiborne Billings changed benicar HCT to plain benicar and decreased dose to 88m qd.   Also started torsemide 253mqd for pt's fluid retention.  PT had repeat CMET, BNP, and TSH drawn yesterday, results are pending.  Pt notes feeling signif improvement in last several days and bp's are avg 110/60s.  This morning was 122/74. Wt is down about 8 lbs since our last visit. No further SOB or fatigue.  Past Medical History:  Diagnosis Date  . AAA [place]abdominal aortic aneurysm) [place]   3.2 cm on MR abd.  F/u aortic u/s 06/2014 showed 3.0 x 3.1 cm AAA: recheck 2 yrs recommended (followed by cardiologist)  . Bilateral renal cysts    simple (03/2014 MRI)  . CAD (coronary artery disease)   . Diabetes mellitus with complication [place] [id number] A1c 6.8%  . Hyperlipemia, mixed   . Hypertension    Cr bump 04/01/16 so I changed benicar-hct to benicar plain and added amlodipine 5 mg.  . Microscopic hematuria    Eval unremarkable by Dr. DaEulogio Ditch . Marland KitchenASH (nonalcoholic steatohepatitis)    Fatty liver on MR abd 03/2014, + hx of elevated transaminases and bili: followed by Dr. MaCollene Mares . Obesity     Past Surgical History:  Procedure Laterality Date  . CARDIAC CATHETERIZATION    . CARDIOVASCULAR STRESS TEST  01/17/2012; 05/04/16   Normal stress nuclear study x 2 (2018--Normal perfusion. LVEF 66% with normal wall motion. This is a low risk study).  . CERVICAL DISCECTOMY  1992  . COLONOSCOPY W/ POLYPECTOMY  09/10/2014   Polypectomy x 2: recall 5 yrs (Dr. MaCollene Mares  . CORONARY ANGIOPLASTY    . CORONARY ARTERY BYPASS GRAFT  03/2006  .  VASECTOMY  1977    Outpatient Medications Prior to Visit  Medication Sig Dispense Refill  . aspirin 81 MG tablet Take 81 mg by mouth daily.    . cholecalciferol (VITAMIN D) 1000 UNITS tablet Take 1,000 Units by mouth daily.    . fluticasone (FLONASE) 50 MCG/ACT nasal spray Place 2 sprays into both nostrils daily. 16 g 6  . Garlic [id number] CAPS Take 1 capsule by mouth 2 (two) times daily.    . metFORMIN (GLUCOPHAGE) 500 MG tablet Take 1 tablet (500 mg total) by mouth 2 (two) times daily with a meal. 60 tablet 6  . metoprolol (LOPRESSOR) 100 MG tablet TAKE 1 TABLET BY MOUTH TWICE DAILY 180 tablet 4  . olmesartan (BENICAR) 20 MG tablet Take 1 tablet (20 mg total) by mouth daily. 30 tablet 6  . omega-3 acid ethyl esters (LOVAZA) 1 g capsule TAKE 2 CAPSULES BY MOUTH ONCE DAILY 180 capsule 3  . torsemide (DEMADEX) 20 MG tablet Take 1 tablet (20 mg total) by mouth daily. 30 tablet 6  . fluticasone (FLONASE) 50 MCG/ACT nasal spray Place 2 sprays into both nostrils daily as needed for allergies or rhinitis.     No facility-administered medications prior to visit.     No Known Allergies  ROS As per HPI  PE: Blood pressure 125/71,  pulse 64, temperature 98 F (36.7 C), temperature source Oral, resp. rate 16, height 5' 9"  (1.753 m), weight 266 lb 8 oz (120.9 kg), SpO2 97 %. Gen: Alert, well appearing.  Patient is oriented to person, place, time, and situation. AFFECT: pleasant, lucid thought and speech. CV: RRR, 3/6 holosystolic murmur, no diastolic murmur.  No r/g. Chest is clear, no wheezing or rales. Normal symmetric air entry throughout both lung fields. No chest wall deformities or tenderness. EXT: no clubbing or cyanosis.  3+ pitting edema in both LL's.  LABS:  Lab Results  Component Value Date   TSH 1.19 05/04/2016   Lab Results  Component Value Date   WBC 7.0 07/20/2015   HGB 14.8 07/20/2015   HCT 42.8 07/20/2015   MCV 87.0 07/20/2015   PLT 149 07/20/2015   Lab Results   Component Value Date   CREATININE 1.06 05/04/2016   BUN 17 05/04/2016   NA 130 (L) 05/04/2016   K 3.7 05/04/2016   CL 95 (L) 05/04/2016   CO2 27 05/04/2016   Lab Results  Component Value Date   ALT 32 05/04/2016   AST 43 (H) 05/04/2016   ALKPHOS 74 05/04/2016   BILITOT 4.5 (H) 05/04/2016   Lab Results  Component Value Date   CHOL 204 (H) 04/01/2016   Lab Results  Component Value Date   HDL 29.70 (L) 04/01/2016   Lab Results  Component Value Date   LDLCALC 151 (H) 04/01/2016   Lab Results  Component Value Date   TRIG 119.0 04/01/2016   Lab Results  Component Value Date   CHOLHDL 7 04/01/2016   IMPRESSION AND PLAN:  1) HTN: with recent symptomatic hypotension.  Stress test neg for ischemia. Resolved with change/lowering of bp med (see HPI). Echocardiogram scheduled for next week.  An After Visit Summary was printed and given to the patient.  FOLLOW UP: Return keep f/u appt already set for 06/29/16.  Signed:  Crissie Sickles, MD           05/08/2016

## 2016-05-05 NOTE — Progress Notes (Signed)
Pre visit review using our clinic review tool, if applicable. No additional management support is needed unless otherwise documented below in the visit note. 

## 2016-05-08 DIAGNOSIS — K7460 Unspecified cirrhosis of liver: Secondary | ICD-10-CM

## 2016-05-08 DIAGNOSIS — D509 Iron deficiency anemia, unspecified: Secondary | ICD-10-CM

## 2016-05-08 DIAGNOSIS — K7581 Nonalcoholic steatohepatitis (NASH): Secondary | ICD-10-CM

## 2016-05-08 HISTORY — DX: Unspecified cirrhosis of liver: K74.60

## 2016-05-08 HISTORY — DX: Nonalcoholic steatohepatitis (NASH): K75.81

## 2016-05-08 HISTORY — DX: Iron deficiency anemia, unspecified: D50.9

## 2016-05-13 MED FILL — FLUTICASONE PROP 50 MCG SPR: 50 | 30 days supply | Qty: 16 | Fill #0

## 2016-05-13 MED FILL — OLMESARTAN MEDOXOMIL 20 MG: 20 | 30 days supply | Qty: 30 | Fill #0

## 2016-05-17 ENCOUNTER — Encounter: Payer: Self-pay | Admitting: Cardiovascular Disease

## 2016-05-19 ENCOUNTER — Ambulatory Visit (HOSPITAL_BASED_OUTPATIENT_CLINIC_OR_DEPARTMENT_OTHER): Payer: 59

## 2016-05-19 ENCOUNTER — Other Ambulatory Visit: Payer: Self-pay

## 2016-05-19 DIAGNOSIS — E8770 Fluid overload, unspecified: Secondary | ICD-10-CM | POA: Diagnosis not present

## 2016-05-19 DIAGNOSIS — N19 Unspecified kidney failure: Secondary | ICD-10-CM | POA: Diagnosis not present

## 2016-05-19 DIAGNOSIS — Z6841 Body Mass Index (BMI) 40.0 and over, adult: Secondary | ICD-10-CM | POA: Diagnosis not present

## 2016-05-19 DIAGNOSIS — I340 Nonrheumatic mitral (valve) insufficiency: Secondary | ICD-10-CM

## 2016-05-19 DIAGNOSIS — D689 Coagulation defect, unspecified: Secondary | ICD-10-CM | POA: Diagnosis not present

## 2016-05-19 DIAGNOSIS — N170 Acute kidney failure with tubular necrosis: Secondary | ICD-10-CM | POA: Diagnosis not present

## 2016-05-19 DIAGNOSIS — J810 Acute pulmonary edema: Secondary | ICD-10-CM | POA: Diagnosis not present

## 2016-05-19 DIAGNOSIS — I350 Nonrheumatic aortic (valve) stenosis: Secondary | ICD-10-CM | POA: Insufficient documentation

## 2016-05-19 DIAGNOSIS — D649 Anemia, unspecified: Secondary | ICD-10-CM | POA: Diagnosis not present

## 2016-05-19 DIAGNOSIS — I130 Hypertensive heart and chronic kidney disease with heart failure and stage 1 through stage 4 chronic kidney disease, or unspecified chronic kidney disease: Secondary | ICD-10-CM | POA: Diagnosis not present

## 2016-05-19 DIAGNOSIS — D62 Acute posthemorrhagic anemia: Secondary | ICD-10-CM | POA: Diagnosis not present

## 2016-05-19 DIAGNOSIS — I248 Other forms of acute ischemic heart disease: Secondary | ICD-10-CM | POA: Diagnosis not present

## 2016-05-19 DIAGNOSIS — R0600 Dyspnea, unspecified: Secondary | ICD-10-CM | POA: Diagnosis not present

## 2016-05-19 DIAGNOSIS — I5033 Acute on chronic diastolic (congestive) heart failure: Secondary | ICD-10-CM | POA: Diagnosis not present

## 2016-05-19 DIAGNOSIS — K922 Gastrointestinal hemorrhage, unspecified: Secondary | ICD-10-CM | POA: Diagnosis not present

## 2016-05-19 DIAGNOSIS — K766 Portal hypertension: Secondary | ICD-10-CM | POA: Diagnosis not present

## 2016-05-19 MED ORDER — PERFLUTREN LIPID MICROSPHERE
1.0000 mL | INTRAVENOUS | Status: AC | PRN
  Administered 2016-05-19: 2 mL via INTRAVENOUS

## 2016-05-20 ENCOUNTER — Telehealth: Payer: Self-pay | Admitting: Cardiovascular Disease

## 2016-05-20 NOTE — Telephone Encounter (Signed)
Please call,Pt is extremely short of breath,turns grey when he exert himself.2 plus Edema in his legs and blood pressure is low.Pt needs to be seen please.

## 2016-05-20 NOTE — Telephone Encounter (Signed)
Returned the phone call to the patient. He stated that he gets short of breath when ambulating but feels good at rest. His wife stated that she feels like he has +2 edema bilaterally in the lower extremities. He has not been checking his weight regularly. His weight at his last PCP appointment was 266 lbs. Today it was 276 lbs. Dr. Claiborne Billings has advised for the patient to take 2 torsemide daily over the weekend, one in the morning and one in the afternoon. He has been educated on and instructed to weigh himself daily. He has an appointment on Wednesday the 25 th to see Almyra Deforest. He has been instructed to go to the Emergency Department if he gets worse over the weekend. He will call back on Monday with an update. He has verbalized his understanding.

## 2016-05-21 ENCOUNTER — Telehealth: Payer: Self-pay | Admitting: Nurse Practitioner

## 2016-05-21 NOTE — Telephone Encounter (Signed)
   Patient's wife called report that despite increasing torsemide from 20 mg daily to 40 milligrams daily yesterday, his weight is up 3 more pounds. He was orthopneic last night. She continues to have lower extremity edema and increased abdominal girth. He does not have much appetite. He is not particularly short of breath at rest. I recommended that he may take an additional torsemide today, for total of 60 mg. He may do this again tomorrow. If however his dyspnea worsens or weight continues to increase, he will need to come into the emergency room for evaluation. If he does not respond torsemide this afternoon and has recurrent orthopnea tonight, his wife plans to bring him to the ED.  Caller verbalized understanding and was grateful for the call back. I will send a message to the office to see if we can have him seen sooner than his currently scheduled visit for April 25.  [name], NP 05/21/2016, 3:08 PM

## 2016-05-22 ENCOUNTER — Inpatient Hospital Stay (HOSPITAL_COMMUNITY)
Admission: EM | Admit: 2016-05-22 | Discharge: 2016-05-29 | DRG: 377 | Disposition: A | Discharge status: Home / Self Care | Payer: 59 | Attending: Internal Medicine | Admitting: Internal Medicine

## 2016-05-22 ENCOUNTER — Encounter (HOSPITAL_COMMUNITY): Payer: Self-pay | Admitting: Emergency Medicine

## 2016-05-22 ENCOUNTER — Other Ambulatory Visit: Payer: Self-pay

## 2016-05-22 ENCOUNTER — Emergency Department (HOSPITAL_COMMUNITY): Payer: 59

## 2016-05-22 DIAGNOSIS — D649 Anemia, unspecified: Secondary | ICD-10-CM

## 2016-05-22 DIAGNOSIS — Z6841 Body Mass Index (BMI) 40.0 and over, adult: Secondary | ICD-10-CM

## 2016-05-22 DIAGNOSIS — I714 Abdominal aortic aneurysm, without rupture: Secondary | ICD-10-CM | POA: Diagnosis present

## 2016-05-22 DIAGNOSIS — I248 Other forms of acute ischemic heart disease: Secondary | ICD-10-CM | POA: Diagnosis present

## 2016-05-22 DIAGNOSIS — Z951 Presence of aortocoronary bypass graft: Secondary | ICD-10-CM | POA: Diagnosis not present

## 2016-05-22 DIAGNOSIS — J810 Acute pulmonary edema: Secondary | ICD-10-CM

## 2016-05-22 DIAGNOSIS — N19 Unspecified kidney failure: Secondary | ICD-10-CM | POA: Diagnosis not present

## 2016-05-22 DIAGNOSIS — E1122 Type 2 diabetes mellitus with diabetic chronic kidney disease: Secondary | ICD-10-CM | POA: Diagnosis present

## 2016-05-22 DIAGNOSIS — K8020 Calculus of gallbladder without cholecystitis without obstruction: Secondary | ICD-10-CM | POA: Diagnosis not present

## 2016-05-22 DIAGNOSIS — K766 Portal hypertension: Secondary | ICD-10-CM | POA: Diagnosis not present

## 2016-05-22 DIAGNOSIS — N281 Cyst of kidney, acquired: Secondary | ICD-10-CM | POA: Diagnosis not present

## 2016-05-22 DIAGNOSIS — I10 Essential (primary) hypertension: Secondary | ICD-10-CM | POA: Diagnosis not present

## 2016-05-22 DIAGNOSIS — I2510 Atherosclerotic heart disease of native coronary artery without angina pectoris: Secondary | ICD-10-CM | POA: Diagnosis not present

## 2016-05-22 DIAGNOSIS — N179 Acute kidney failure, unspecified: Secondary | ICD-10-CM | POA: Diagnosis present

## 2016-05-22 DIAGNOSIS — R52 Pain, unspecified: Secondary | ICD-10-CM

## 2016-05-22 DIAGNOSIS — K922 Gastrointestinal hemorrhage, unspecified: Principal | ICD-10-CM | POA: Diagnosis present

## 2016-05-22 DIAGNOSIS — I350 Nonrheumatic aortic (valve) stenosis: Secondary | ICD-10-CM | POA: Diagnosis present

## 2016-05-22 DIAGNOSIS — E1159 Type 2 diabetes mellitus with other circulatory complications: Secondary | ICD-10-CM | POA: Diagnosis not present

## 2016-05-22 DIAGNOSIS — E119 Type 2 diabetes mellitus without complications: Secondary | ICD-10-CM

## 2016-05-22 DIAGNOSIS — R635 Abnormal weight gain: Secondary | ICD-10-CM | POA: Diagnosis not present

## 2016-05-22 DIAGNOSIS — N289 Disorder of kidney and ureter, unspecified: Secondary | ICD-10-CM

## 2016-05-22 DIAGNOSIS — E876 Hypokalemia: Secondary | ICD-10-CM | POA: Diagnosis not present

## 2016-05-22 DIAGNOSIS — S92332A Displaced fracture of third metatarsal bone, left foot, initial encounter for closed fracture: Secondary | ICD-10-CM | POA: Diagnosis not present

## 2016-05-22 DIAGNOSIS — D689 Coagulation defect, unspecified: Secondary | ICD-10-CM | POA: Diagnosis present

## 2016-05-22 DIAGNOSIS — I5032 Chronic diastolic (congestive) heart failure: Secondary | ICD-10-CM

## 2016-05-22 DIAGNOSIS — Z7982 Long term (current) use of aspirin: Secondary | ICD-10-CM | POA: Diagnosis not present

## 2016-05-22 DIAGNOSIS — J811 Chronic pulmonary edema: Secondary | ICD-10-CM | POA: Diagnosis present

## 2016-05-22 DIAGNOSIS — K7460 Unspecified cirrhosis of liver: Secondary | ICD-10-CM | POA: Diagnosis present

## 2016-05-22 DIAGNOSIS — D509 Iron deficiency anemia, unspecified: Secondary | ICD-10-CM | POA: Diagnosis not present

## 2016-05-22 DIAGNOSIS — I130 Hypertensive heart and chronic kidney disease with heart failure and stage 1 through stage 4 chronic kidney disease, or unspecified chronic kidney disease: Secondary | ICD-10-CM | POA: Diagnosis present

## 2016-05-22 DIAGNOSIS — R0602 Shortness of breath: Secondary | ICD-10-CM | POA: Diagnosis not present

## 2016-05-22 DIAGNOSIS — D62 Acute posthemorrhagic anemia: Secondary | ICD-10-CM | POA: Diagnosis present

## 2016-05-22 DIAGNOSIS — N170 Acute kidney failure with tubular necrosis: Secondary | ICD-10-CM | POA: Diagnosis present

## 2016-05-22 DIAGNOSIS — E782 Mixed hyperlipidemia: Secondary | ICD-10-CM | POA: Diagnosis present

## 2016-05-22 DIAGNOSIS — D500 Iron deficiency anemia secondary to blood loss (chronic): Secondary | ICD-10-CM | POA: Diagnosis present

## 2016-05-22 DIAGNOSIS — Z79899 Other long term (current) drug therapy: Secondary | ICD-10-CM

## 2016-05-22 DIAGNOSIS — D696 Thrombocytopenia, unspecified: Secondary | ICD-10-CM | POA: Diagnosis present

## 2016-05-22 DIAGNOSIS — K219 Gastro-esophageal reflux disease without esophagitis: Secondary | ICD-10-CM | POA: Diagnosis not present

## 2016-05-22 DIAGNOSIS — R748 Abnormal levels of other serum enzymes: Secondary | ICD-10-CM | POA: Diagnosis not present

## 2016-05-22 DIAGNOSIS — K7581 Nonalcoholic steatohepatitis (NASH): Secondary | ICD-10-CM | POA: Diagnosis present

## 2016-05-22 DIAGNOSIS — Z8719 Personal history of other diseases of the digestive system: Secondary | ICD-10-CM | POA: Diagnosis present

## 2016-05-22 DIAGNOSIS — K760 Fatty (change of) liver, not elsewhere classified: Secondary | ICD-10-CM | POA: Diagnosis not present

## 2016-05-22 DIAGNOSIS — K7689 Other specified diseases of liver: Secondary | ICD-10-CM | POA: Diagnosis not present

## 2016-05-22 DIAGNOSIS — N182 Chronic kidney disease, stage 2 (mild): Secondary | ICD-10-CM

## 2016-05-22 DIAGNOSIS — Z7984 Long term (current) use of oral hypoglycemic drugs: Secondary | ICD-10-CM | POA: Diagnosis not present

## 2016-05-22 DIAGNOSIS — R195 Other fecal abnormalities: Secondary | ICD-10-CM | POA: Diagnosis not present

## 2016-05-22 DIAGNOSIS — N183 Chronic kidney disease, stage 3 (moderate): Secondary | ICD-10-CM | POA: Diagnosis present

## 2016-05-22 DIAGNOSIS — R161 Splenomegaly, not elsewhere classified: Secondary | ICD-10-CM | POA: Diagnosis not present

## 2016-05-22 DIAGNOSIS — Z87891 Personal history of nicotine dependence: Secondary | ICD-10-CM | POA: Diagnosis not present

## 2016-05-22 DIAGNOSIS — N181 Chronic kidney disease, stage 1: Secondary | ICD-10-CM | POA: Diagnosis not present

## 2016-05-22 DIAGNOSIS — R0600 Dyspnea, unspecified: Secondary | ICD-10-CM | POA: Diagnosis present

## 2016-05-22 DIAGNOSIS — K649 Unspecified hemorrhoids: Secondary | ICD-10-CM | POA: Diagnosis not present

## 2016-05-22 DIAGNOSIS — N189 Chronic kidney disease, unspecified: Secondary | ICD-10-CM | POA: Diagnosis present

## 2016-05-22 DIAGNOSIS — I5033 Acute on chronic diastolic (congestive) heart failure: Secondary | ICD-10-CM | POA: Diagnosis not present

## 2016-05-22 DIAGNOSIS — R188 Other ascites: Secondary | ICD-10-CM | POA: Diagnosis not present

## 2016-05-22 DIAGNOSIS — K5790 Diverticulosis of intestine, part unspecified, without perforation or abscess without bleeding: Secondary | ICD-10-CM | POA: Diagnosis present

## 2016-05-22 LAB — URINALYSIS, ROUTINE W REFLEX MICROSCOPIC
BILIRUBIN URINE: NEGATIVE
GLUCOSE, UA: NEGATIVE mg/dL
HGB URINE DIPSTICK: NEGATIVE
KETONES UR: NEGATIVE mg/dL
LEUKOCYTES UA: NEGATIVE
Nitrite: NEGATIVE
PH: 5 (ref 5.0–8.0)
Protein, ur: 100 mg/dL — AB
Specific Gravity, Urine: 1.01 (ref 1.005–1.030)

## 2016-05-22 LAB — CBC WITH DIFFERENTIAL/PLATELET
BASOS ABS: 0.1 10*3/uL (ref 0.0–0.1)
BASOS PCT: 1 %
Basophils Absolute: 0.1 10*3/uL (ref 0.0–0.1)
Basophils Relative: 1 %
EOS ABS: 0.5 10*3/uL (ref 0.0–0.7)
EOS PCT: 7 %
Eosinophils Absolute: 0.7 10*3/uL (ref 0.0–0.7)
Eosinophils Relative: 9 %
HCT: 24 % — ABNORMAL LOW (ref 39.0–52.0)
HEMATOCRIT: 20.1 % — AB (ref 39.0–52.0)
Hemoglobin: 6.3 g/dL — CL (ref 13.0–17.0)
Hemoglobin: 7.5 g/dL — ABNORMAL LOW (ref 13.0–17.0)
LYMPHS ABS: 2.2 10*3/uL (ref 0.7–4.0)
Lymphocytes Relative: 29 %
Lymphocytes Relative: 25 %
Lymphs Abs: 1.8 10*3/uL (ref 0.7–4.0)
MCH: 25.1 pg — ABNORMAL LOW (ref 26.0–34.0)
MCH: 25.3 pg — AB (ref 26.0–34.0)
MCHC: 31.3 g/dL (ref 30.0–36.0)
MCHC: 31.3 g/dL (ref 30.0–36.0)
MCV: 80.1 fL (ref 78.0–100.0)
MCV: 80.8 fL (ref 78.0–100.0)
MONO ABS: 1.1 10*3/uL — AB (ref 0.1–1.0)
MONO ABS: 1.4 10*3/uL — AB (ref 0.1–1.0)
MONOS PCT: 14 %
MONOS PCT: 19 %
NEUTROS ABS: 3.5 10*3/uL (ref 1.7–7.7)
NEUTROS PCT: 48 %
Neutro Abs: 3.5 10*3/uL (ref 1.7–7.7)
Neutrophils Relative %: 47 %
PLATELETS: 152 10*3/uL (ref 150–400)
PLATELETS: 137 10*3/uL — AB (ref 150–400)
RBC: 2.51 MIL/uL — ABNORMAL LOW (ref 4.22–5.81)
RBC: 2.97 MIL/uL — ABNORMAL LOW (ref 4.22–5.81)
RDW: 16.3 % — AB (ref 11.5–15.5)
RDW: 15.3 % (ref 11.5–15.5)
WBC: 7.6 10*3/uL (ref 4.0–10.5)
WBC: 7.2 10*3/uL (ref 4.0–10.5)

## 2016-05-22 LAB — COMPREHENSIVE METABOLIC PANEL
ALBUMIN: 2.9 g/dL — AB (ref 3.5–5.0)
ALT: 29 U/L (ref 17–63)
ANION GAP: 11 (ref 5–15)
AST: 36 U/L (ref 15–41)
Alkaline Phosphatase: 74 U/L (ref 38–126)
BILIRUBIN TOTAL: 4.1 mg/dL — AB (ref 0.3–1.2)
BUN: 28 mg/dL — ABNORMAL HIGH (ref 6–20)
CHLORIDE: 97 mmol/L — AB (ref 101–111)
CO2: 25 mmol/L (ref 22–32)
Calcium: 8.9 mg/dL (ref 8.9–10.3)
Creatinine, Ser: 2.06 mg/dL — ABNORMAL HIGH (ref 0.61–1.24)
GFR calc Af Amer: 37 mL/min — ABNORMAL LOW (ref >60)
GFR, EST NON AFRICAN AMERICAN: 32 mL/min — AB (ref >60)
GLUCOSE: 138 mg/dL — AB (ref 65–99)
POTASSIUM: 3.8 mmol/L (ref 3.5–5.1)
Sodium: 133 mmol/L — ABNORMAL LOW (ref 135–145)
TOTAL PROTEIN: 6.7 g/dL (ref 6.5–8.1)

## 2016-05-22 LAB — BRAIN NATRIURETIC PEPTIDE: B NATRIURETIC PEPTIDE 5: 765.6 pg/mL — AB (ref 0.0–100.0)

## 2016-05-22 LAB — I-STAT TROPONIN, ED: TROPONIN I, POC: 0.07 ng/mL (ref 0.00–0.08)

## 2016-05-22 LAB — APTT: APTT: 39 s — AB (ref 24–36)

## 2016-05-22 LAB — RETICULOCYTES
RBC.: 2.26 MIL/uL — AB (ref 4.22–5.81)
RETIC COUNT ABSOLUTE: 61 10*3/uL (ref 19.0–186.0)
RETIC CT PCT: 2.7 % (ref 0.4–3.1)

## 2016-05-22 LAB — FOLATE: FOLATE: 13.6 ng/mL (ref >5.9)

## 2016-05-22 LAB — CBG MONITORING, ED
Glucose-Capillary: 103 mg/dL — ABNORMAL HIGH (ref 65–99)
Glucose-Capillary: 98 mg/dL (ref 65–99)

## 2016-05-22 LAB — IRON AND TIBC
IRON: 21 ug/dL — AB (ref 45–182)
Saturation Ratios: 4 % — ABNORMAL LOW (ref 17.9–39.5)
TIBC: 486 ug/dL — AB (ref 250–450)
UIBC: 465 ug/dL

## 2016-05-22 LAB — POC OCCULT BLOOD, ED: Fecal Occult Bld: POSITIVE — AB

## 2016-05-22 LAB — BILIRUBIN, DIRECT: Bilirubin, Direct: 0.7 mg/dL — ABNORMAL HIGH (ref 0.1–0.5)

## 2016-05-22 LAB — PROTIME-INR
INR: 1.74
Prothrombin Time: 20.5 seconds — ABNORMAL HIGH (ref 11.4–15.2)

## 2016-05-22 LAB — PREPARE RBC (CROSSMATCH)

## 2016-05-22 LAB — PHOSPHORUS: Phosphorus: 4.3 mg/dL (ref 2.5–4.6)

## 2016-05-22 LAB — GLUCOSE, CAPILLARY
GLUCOSE-CAPILLARY: 97 mg/dL (ref 65–99)
Glucose-Capillary: 91 mg/dL (ref 65–99)

## 2016-05-22 LAB — VITAMIN B12: VITAMIN B 12: 909 pg/mL (ref 180–914)

## 2016-05-22 LAB — TROPONIN I: TROPONIN I: 0.11 ng/mL — AB (ref <0.03)

## 2016-05-22 LAB — MAGNESIUM: MAGNESIUM: 2.3 mg/dL (ref 1.7–2.4)

## 2016-05-22 LAB — MRSA PCR SCREENING: MRSA BY PCR: NEGATIVE

## 2016-05-22 LAB — FERRITIN: FERRITIN: 8 ng/mL — AB (ref 24–336)

## 2016-05-22 LAB — CREATININE, URINE, RANDOM: Creatinine, Urine: 144.41 mg/dL

## 2016-05-22 MED ORDER — GARLIC 1000 MG PO CAPS: 1.0000 | ORAL_CAPSULE | Freq: Two times a day (BID) | ORAL | Status: DC

## 2016-05-22 MED ORDER — ACETAMINOPHEN 650 MG RE SUPP: 650.0000 mg | Freq: Four times a day (QID) | RECTAL | Status: DC | PRN

## 2016-05-22 MED ORDER — SODIUM CHLORIDE 0.9% FLUSH
3.0000 mL | Freq: Two times a day (BID) | INTRAVENOUS | Status: DC
  Administered 2016-05-22 – 2016-05-29 (×11): 3 mL via INTRAVENOUS

## 2016-05-22 MED ORDER — ONDANSETRON HCL 4 MG PO TABS: 4.0000 mg | ORAL_TABLET | Freq: Four times a day (QID) | ORAL | Status: DC | PRN

## 2016-05-22 MED ORDER — ACETAMINOPHEN 325 MG PO TABS: 650.0000 mg | ORAL_TABLET | Freq: Four times a day (QID) | ORAL | Status: DC | PRN

## 2016-05-22 MED ORDER — SODIUM CHLORIDE 0.9 % IV SOLN
10.0000 mL/h | Freq: Once | INTRAVENOUS | Status: AC
  Administered 2016-05-22: 10 mL/h via INTRAVENOUS

## 2016-05-22 MED ORDER — ALBUTEROL SULFATE (2.5 MG/3ML) 0.083% IN NEBU
2.5000 mg | INHALATION_SOLUTION | Freq: Once | RESPIRATORY_TRACT | Status: AC
  Administered 2016-05-22: 2.5 mg via RESPIRATORY_TRACT
  Filled 2016-05-22: qty 3

## 2016-05-22 MED ORDER — ONDANSETRON HCL 4 MG/2ML IJ SOLN: 4.0000 mg | Freq: Four times a day (QID) | INTRAMUSCULAR | Status: DC | PRN

## 2016-05-22 MED ORDER — HYDRALAZINE HCL 20 MG/ML IJ SOLN: 10.0000 mg | Freq: Three times a day (TID) | INTRAMUSCULAR | Status: DC | PRN

## 2016-05-22 MED ORDER — INSULIN ASPART 100 UNIT/ML ~~LOC~~ SOLN: 0.0000 [IU] | SUBCUTANEOUS | Status: DC

## 2016-05-22 NOTE — Consult Note (Signed)
Reason for Consult: Severe iron deficiency anemia with guaiac positive stools Referring Physician: [facility].  William [name] is an 66 y.o. male.  HPI: William [name] is a 66 year old white male with multiple medical problems listed below who was in his usual state of health till about 3 weeks ago when he started having problems with pedal edema and abnormal weight gain. He claims his Dr. Claiborne Billings has been adjusting the dosage of diuretics but the edema seems to be unresponsive to the increase in his diuretics. He got progressively short of breath and had generalized weakness with malaise prompting him to come to the emergency room where he was found to be severely anemic with hemoglobin of 6.3 g/dL and guaiac-positive stools. Last year his hemoglobin was about 14 g/dL. He's had a colonoscopy on 09/10/2014 when a couple of small polyp removed was found to have a few scattered diverticula. He denies any GI problems at this time. There is no history of nausea vomiting abdominal pain diarrhea constipation melena or hematochezia. He does have occasional problems with reflux worse postprandially. He denies the use or abuse of nonsteroidals. He's had abnormal LFTs attribute it NAFLD. At times, he has occasional retrosternal burning with the reflux. His creatinine is also found to be elevated to 2 today which is markedly abnormal for him. He is found to have peptic and 3+ pitting edema up to his knees on admission.  Past Medical History:  Diagnosis Date  . AAA (abdominal aortic aneurysm) ([place]) 03/2014   3.2 cm on MR abd.  F/u aortic u/s 06/2014 showed 3.0 x 3.1 cm AAA: recheck 2 yrs recommended (followed by cardiologist)  . Bilateral renal cysts    simple (03/2014 MRI)  . CAD (coronary artery disease)   . Diabetes mellitus with complication ([place]) [id number]/9357   A1c 6.8%  . Hyperlipemia, mixed   . Hypertension    Cr bump 04/01/16 so I changed benicar-hct to benicar plain and added amlodipine 5 mg.  .  Microscopic hematuria    Eval unremarkable by Dr. Eulogio Ditch.  [name] NASH (nonalcoholic steatohepatitis)    Fatty liver on MR abd 03/2014, + hx of elevated transaminases and bili: followed by Dr. [name].  . Morbid obesity    Past Surgical History:  Procedure Laterality Date  . CARDIAC CATHETERIZATION    . CARDIOVASCULAR STRESS TEST  01/17/2012; 05/04/16   Normal stress nuclear study x 2 (2018--Normal perfusion. LVEF 66% with normal wall motion. This is a low risk study).  . CERVICAL DISCECTOMY  1992  . COLONOSCOPY W/ POLYPECTOMY  09/10/2014   Polypectomy x 2: recall 5 yrs (Dr. [name]).  . CORONARY ANGIOPLASTY    . CORONARY ARTERY BYPASS GRAFT  03/2006  . VASECTOMY  1977   Family History  Problem Relation Age of Onset  . Hypertension Mother   . Cancer - Other Mother     liver cancer  . Heart disease Father   . Heart attack Father   . Cancer - Lung Father   . Diabetes Father   . Liver disease Sister   . Heart disease Brother     CABG 20 YEARS AGO  . Hypertension Brother   . Mesothelioma Brother     HALF-BROTHER  . Cancer - Lung Brother   . Cancer - Lung Sister     HALF-SISTER  . Cancer - Other Sister     KIDNEY  (HALF-SISTER)  . Liver disease Brother   . Heart disease Brother     CABG-2012  Social History:  reports that he has quit smoking. His smoking use included Cigarettes. He has never used smokeless tobacco. He reports that he does not drink alcohol or use drugs.  Allergies:  Allergies  Allergen Reactions  . Statins Other (See Comments)    Liver enzymes go up whenever on them   Medications: I have reviewed the patient's current medications.  Results for orders placed or performed during the hospital encounter of 05/22/16 (from the past 48 hour(s))  APTT     Status: Abnormal   Collection Time: 05/22/16  6:53 AM  Result Value Ref Range   aPTT 39 (H) 24 - 36 seconds    Comment:        IF BASELINE aPTT IS ELEVATED, SUGGEST PATIENT RISK ASSESSMENT BE USED TO DETERMINE  APPROPRIATE ANTICOAGULANT THERAPY.   Protime-INR     Status: Abnormal   Collection Time: 05/22/16  6:53 AM  Result Value Ref Range   Prothrombin Time 20.5 (H) 11.4 - 15.2 seconds   INR 1.74   CBC with Differential     Status: Abnormal   Collection Time: 05/22/16  7:13 AM  Result Value Ref Range   WBC 7.6 4.0 - 10.5 K/uL   RBC 2.51 (L) 4.22 - 5.81 MIL/uL   Hemoglobin 6.3 (LL) 13.0 - 17.0 g/dL    Comment: REPEATED TO VERIFY CRITICAL RESULT CALLED TO, READ BACK BY AND VERIFIED WITH: ELISHA BABB RN AT 9147 05/22/16 BY WOOLLENK    HCT 20.1 (L) 39.0 - 52.0 %   MCV 80.1 78.0 - 100.0 fL   MCH 25.1 (L) 26.0 - 34.0 pg   MCHC 31.3 30.0 - 36.0 g/dL   RDW 16.3 (H) 11.5 - 15.5 %   Platelets 152 150 - 400 K/uL   Neutrophils Relative % 47 %   Lymphocytes Relative 29 %   Monocytes Relative 14 %   Eosinophils Relative 9 %   Basophils Relative 1 %   Neutro Abs 3.5 1.7 - 7.7 K/uL   Lymphs Abs 2.2 0.7 - 4.0 K/uL   Monocytes Absolute 1.1 (H) 0.1 - 1.0 K/uL   Eosinophils Absolute 0.7 0.0 - 0.7 K/uL   Basophils Absolute 0.1 0.0 - 0.1 K/uL   Smear Review MORPHOLOGY UNREMARKABLE   Comprehensive metabolic panel     Status: Abnormal   Collection Time: 05/22/16  7:13 AM  Result Value Ref Range   Sodium 133 (L) 135 - 145 mmol/L   Potassium 3.8 3.5 - 5.1 mmol/L   Chloride 97 (L) 101 - 111 mmol/L   CO2 25 22 - 32 mmol/L   Glucose, Bld 138 (H) 65 - 99 mg/dL   BUN 28 (H) 6 - 20 mg/dL   Creatinine, Ser 2.06 (H) 0.61 - 1.24 mg/dL   Calcium 8.9 8.9 - 10.3 mg/dL   Total Protein 6.7 6.5 - 8.1 g/dL   Albumin 2.9 (L) 3.5 - 5.0 g/dL   AST 36 15 - 41 U/L   ALT 29 17 - 63 U/L   Alkaline Phosphatase 74 38 - 126 U/L   Total Bilirubin 4.1 (H) 0.3 - 1.2 mg/dL   GFR calc non Af Amer 32 (L) >60 mL/min   GFR calc Af Amer 37 (L) >60 mL/min    Comment: (NOTE) The eGFR has been calculated using the CKD EPI equation. This calculation has not been validated in all clinical situations. eGFR's persistently <60 mL/min  signify possible Chronic Kidney Disease.    Anion gap 11 5 - 15  Brain natriuretic peptide     Status: Abnormal   Collection Time: 05/22/16  7:13 AM  Result Value Ref Range   B Natriuretic Peptide 765.6 (H) 0.0 - 100.0 pg/mL  Type and screen [place]     Status: None (Preliminary result)   Collection Time: 05/22/16  8:02 AM  Result Value Ref Range   ABO/RH(D) O POS    Antibody Screen NEG    Sample Expiration 05/25/2016    Unit Number [id number]    Blood Component Type RED CELLS,LR    Unit division 00    Status of Unit ALLOCATED    Transfusion Status OK TO TRANSFUSE    Crossmatch Result Compatible    Unit Number [id number]    Blood Component Type RED CELLS,LR    Unit division 00    Status of Unit ALLOCATED    Transfusion Status OK TO TRANSFUSE    Crossmatch Result Compatible   Reticulocytes     Status: Abnormal   Collection Time: 05/22/16  8:03 AM  Result Value Ref Range   Retic Ct Pct 2.7 0.4 - 3.1 %   RBC. 2.26 (L) 4.22 - 5.81 MIL/uL   Retic Count, Manual 61.0 19.0 - 186.0 K/uL  I-Stat Troponin, ED (not at [facility])     Status: None   Collection Time: 05/22/16  8:19 AM  Result Value Ref Range   Troponin i, poc 0.07 0.00 - 0.08 ng/mL   Comment 3            Comment: Due to the release kinetics of cTnI, a negative result within the first hours of the onset of symptoms does not rule out myocardial infarction with certainty. If myocardial infarction is still suspected, repeat the test at appropriate intervals.   POC occult blood, ED     Status: Abnormal   Collection Time: 05/22/16  8:19 AM  Result Value Ref Range   Fecal Occult Bld POSITIVE (A) NEGATIVE  Prepare RBC     Status: None   Collection Time: 05/22/16  8:48 AM  Result Value Ref Range   Order Confirmation ORDER PROCESSED BY BLOOD BANK    Dg Chest 2 View  Result Date: 05/22/2016 CLINICAL DATA:  Shortness of breath.  Lower extremity swelling. EXAM: CHEST  2 VIEW COMPARISON:  Two-view chest  x-ray 04/27/2006 FINDINGS: Heart is enlarged. A diffuse interstitial pattern is now present, suggesting edema. There are no significant effusions. Mild bibasilar atelectasis is present. No significant airspace consolidation is present. The visualized soft tissues and bony thorax are unremarkable. IMPRESSION: 1. Cardiomegaly and mild edema compatible with congestive heart failure. 2. Mild bibasilar airspace disease likely reflects atelectasis. Electronically Signed   By: San Morelle M.D.   On: 05/22/2016 09:04   Review of Systems  Constitutional: Positive for malaise/fatigue. Negative for chills, diaphoresis, fever and weight loss.  HENT: Negative.   Eyes: Negative.   Respiratory: Positive for shortness of breath. Negative for cough, hemoptysis, sputum production and wheezing.   Cardiovascular: Positive for palpitations, orthopnea and leg swelling. Negative for claudication and PND.  Gastrointestinal: Negative for abdominal pain, blood in stool, constipation, diarrhea, heartburn, melena, nausea and vomiting.  Genitourinary: Negative.   Musculoskeletal: Positive for joint pain and myalgias.  Skin: Negative.   Neurological: Positive for weakness.  Endo/Heme/Allergies: Negative.   Psychiatric/Behavioral: Negative.    Blood pressure (!) 99/57, pulse 63, temperature 98.5 F (36.9 C), temperature source Oral, resp. rate 17, height 5' 9"  (1.753 m), weight 125.9 kg (277  lb 8 oz), SpO2 97 %. Physical Exam  Constitutional: He appears well-developed and well-nourished. He is active and cooperative.  Morbidly obese  HENT:  Head: Normocephalic and atraumatic.  Eyes: Conjunctivae and EOM are normal. Pupils are equal, round, and reactive to light.  Neck: Normal range of motion. Neck supple.  Cardiovascular: Normal rate, regular rhythm, S1 normal and S2 normal.   Murmur heard.  Systolic murmur is present with a grade of 3/6  Respiratory: Effort normal and breath sounds normal.  GI: Soft. Bowel  sounds are normal. He exhibits no distension and no mass. There is no tenderness. There is no rebound and no guarding.  Neurological: He is alert.  Skin: Skin is warm and dry.  Psychiatric: He has a normal mood and affect.   Assessment/Plan: 1) Severe iron deficiency anemia with guaiac-positive stools GI source of blood loss will need to be ruled out once the patient's acute cardiac condition stabilizes. 2) Hyperbilirubinemia with normal alkaline phosphatase and AST will need to be monitored closely. Indirect bilirubin levels will have to also be checked.  3) Diffuse ST segment depression with T-wave inversions on EKG probably secondary to anemia.  4) Hypertension-well controlled.  5) AODM. 6) Hypercholesterolemia-intolerant of statins trying to do Repatha. 7) Acute renal insufficiency. 8) Morbid obesity.  William [name] 05/22/2016, 9:11 AM

## 2016-05-22 NOTE — ED Notes (Signed)
Attempted report x 2 

## 2016-05-22 NOTE — ED Triage Notes (Signed)
Per family patient was taken off HCTZ in February due to elevated kidney function.  Weight increased to he was started on Torsemide March 21st.  Worked well at first but "seemed like it just quit working".  Increased dose from 46m to 437ma week ago after gaining a few pounds.  Started having SOB and restlessness on Friday.  Increased to 601mut continued to gain weight. Little output reported from torsemide.  C/O epigastric burning and distention of abdomen.  Also c/o chest pressure.

## 2016-05-22 NOTE — ED Notes (Signed)
ED Provider at bedside. 

## 2016-05-22 NOTE — ED Notes (Signed)
Attempted report. CN states there is a patient in the room assigned. CN to call patient placement.

## 2016-05-22 NOTE — Consult Note (Signed)
Reason for Consult: CHF exacerbation  Requesting Physician: Aggie Moats  Cardiologist: Claiborne Billings  HPI: This is a 66 y.o. male with a past medical history significant for coronary artery disease s/p CABG 2008  (LIMA to the LAD, SVG to the RCA, SVG to the circumflex ), PAD, AAA, diabetes mellitus type 2 on metformin monotherapy, hyperlipidemia intolerant to statins, probable fatty liver, hypertension admitted with lower extremity edema, orthopnea, 16 pound weight gain and abnormal ECG in the setting of severe anemia.   He has had gradual volume gain not responsive to diuretics for a couple of months, culminating in orthopnea about 3 days ago. Despite increasing doses of diuretics he has progressively worsened. His echo shows normal left ventricular systolic function (although it does confirm diastolic dysfunction and elevated filling pressures), mild aortic stenosis, mild dilation of the ascending aorta. A recent nuclear stress test shows no perfusion abnormalities  In June 2017 his hemoglobin was normal at 14, he now presents with a hemoglobin of 6.3 and evidence of iron deficiency. He had a colonoscopy with removal of 2 small benign polyps roughly a year and a half ago. He has not had melena or hematemesis or hematochezia. He has occasional mild retrosternal burning sensation when he lies down, relieved by sitting up suggestive of acid reflux. He has a positive fecal occult blood test today. His creatinine, usually at the upper limit of normal, is markedly abnormal around 2.0.  He has markedly elevated LDL around the 150, but has been intolerant of all statins due to repeated abnormalities in his liver function tests. He appears to fatty liver, his mother died from liver disease and his brother has had a liver transplant. He is in the process of seeking approval for repatha.  His wife is a Marine scientist at [employer], involved in the quality metrics for vascular surgery  PMHx:  Past Medical History:    Diagnosis Date  . AAA (abdominal aortic aneurysm) ([place]) 03/2014   3.2 cm on MR abd.  F/u aortic u/s 06/2014 showed 3.0 x 3.1 cm AAA: recheck 2 yrs recommended (followed by cardiologist)  . Bilateral renal cysts    simple (03/2014 MRI)  . CAD (coronary artery disease)   . Diabetes mellitus with complication ([place]) 50/5397   A1c 6.8%  . Hyperlipemia, mixed   . Hypertension    Cr bump 04/01/16 so I changed benicar-hct to benicar plain and added amlodipine 5 mg.  . Microscopic hematuria    Eval unremarkable by Dr. Eulogio Ditch.  [name] NASH (nonalcoholic steatohepatitis)    Fatty liver on MR abd 03/2014, + hx of elevated transaminases and bili: followed by Dr. [name].  . Obesity    Past Surgical History:  Procedure Laterality Date  . CARDIAC CATHETERIZATION    . CARDIOVASCULAR STRESS TEST  01/17/2012; 05/04/16   Normal stress nuclear study x 2 (2018--Normal perfusion. LVEF 66% with normal wall motion. This is a low risk study).  . CERVICAL DISCECTOMY  1992  . COLONOSCOPY W/ POLYPECTOMY  09/10/2014   Polypectomy x 2: recall 5 yrs (Dr. [name]).  . CORONARY ANGIOPLASTY    . CORONARY ARTERY BYPASS GRAFT  03/2006  . VASECTOMY  1977    FAMHx: Family History  Problem Relation Age of Onset  . Hypertension Mother   . Cancer - Other Mother     liver cancer  . Heart disease Father   . Heart attack Father   . Cancer - Lung Father   . Diabetes Father   .  Liver disease Sister   . Heart disease Brother     CABG 20 YEARS AGO  . Hypertension Brother   . Mesothelioma Brother     HALF-BROTHER  . Cancer - Lung Brother   . Cancer - Lung Sister     HALF-SISTER  . Cancer - Other Sister     KIDNEY  (HALF-SISTER)  . Liver disease Brother   . Heart disease Brother     CABG-2012    SOCHx:  reports that he has quit smoking. His smoking use included Cigarettes. He has never used smokeless tobacco. He reports that he does not drink alcohol or use drugs.  ALLERGIES: Allergies  Allergen Reactions  .  Statins Other (See Comments)    Liver enzymes go up whenever on them    ROS: Pertinent items noted in HPI and remainder of comprehensive ROS otherwise negative.  HOME MEDICATIONS: No current facility-administered medications on file prior to encounter.    Current Outpatient Prescriptions on File Prior to Encounter  Medication Sig Dispense Refill  . fluticasone (FLONASE) 50 MCG/ACT nasal spray Place 2 sprays into both nostrils daily. (Patient taking differently: Place 2 sprays into both nostrils daily as needed for allergies. ) 16 g 6  . Garlic 4166 MG CAPS Take 1 capsule by mouth 2 (two) times daily.    . metFORMIN (GLUCOPHAGE) 500 MG tablet Take 1 tablet (500 mg total) by mouth 2 (two) times daily with a meal. 60 tablet 6  . metoprolol (LOPRESSOR) 100 MG tablet TAKE 1 TABLET BY MOUTH TWICE DAILY (Patient taking differently: TAKE 1/2 TABLET BY MOUTH TWICE DAILY) 180 tablet 4  . olmesartan (BENICAR) 20 MG tablet Take 1 tablet (20 mg total) by mouth daily. (Patient taking differently: Take 10 mg by mouth daily. ) 30 tablet 6  . omega-3 acid ethyl esters (LOVAZA) 1 g capsule TAKE 2 CAPSULES BY MOUTH ONCE DAILY (Patient taking differently: TAKE 1 CAPSULES BY MOUTH TWICE DAILY) 180 capsule 3  . torsemide (DEMADEX) 20 MG tablet Take 1 tablet (20 mg total) by mouth daily. (Patient taking differently: Take 60 mg by mouth daily. ) 30 tablet 6    HOSPITAL MEDICATIONS: I have reviewed the patient's current medications.  VITALS: Blood pressure (!) 102/43, pulse 67, temperature 98.6 F (37 C), temperature source Oral, resp. rate 15, height 5' 9"  (1.753 m), weight 125.9 kg (277 lb 8 oz), SpO2 98 %.  PHYSICAL EXAM:  General: Alert, oriented x3, no distress. His face does not appear pale but his mucosal membranes and nailbeds are pale Head: no evidence of trauma, PERRL, EOMI, no exophtalmos or lid lag, no myxedema, no xanthelasma; normal ears, nose and oropharynx Neck: 10-12 jugular venous pulsation  elevation and prompt hepatojugular reflux; brisk carotid pulses without delay and with faint bilateral carotid bruits Chest: clear to auscultation, no signs of consolidation by percussion or palpation, normal fremitus, symmetrical and full respiratory excursions Cardiovascular: normal position and quality of the apical impulse, regular rhythm, normal first heart sound and  second heart sound, no rubs or gallops, 3/6 early peaking systolic ejection murmur in the aortic focus, no diastolic murmurs Abdomen: no tenderness or distention, no masses by palpation, no abnormal pulsatility or arterial bruits, normal bowel sounds, no hepatosplenomegaly Extremities: no clubbing, cyanosis;  3+ symmetrical bilateral calf edema almost to the knees; 2+ radial, ulnar and brachial pulses bilaterally; 2+ right femoral, posterior tibial and dorsalis pedis pulses; 2+ left femoral, posterior tibial and dorsalis pedis pulses; no subclavian or femoral  bruits Neurological: grossly nonfocal   LABS  CBC  Recent Labs  05/22/16 0713  WBC 7.6  NEUTROABS 3.5  HGB 6.3*  HCT 20.1*  MCV 80.1  PLT 656   Basic Metabolic Panel  Recent Labs  05/22/16 0713  NA 133*  K 3.8  CL 97*  CO2 25  GLUCOSE 138*  BUN 28*  CREATININE 2.06*  CALCIUM 8.9   Liver Function Tests  Recent Labs  05/22/16 0713  AST 36  ALT 29  ALKPHOS 74  BILITOT 4.1*  PROT 6.7  ALBUMIN 2.9*     IMAGING: Dg Chest 2 View  Result Date: 05/22/2016 CLINICAL DATA:  Shortness of breath.  Lower extremity swelling. EXAM: CHEST  2 VIEW COMPARISON:  Two-view chest x-ray 04/27/2006 FINDINGS: Heart is enlarged. A diffuse interstitial pattern is now present, suggesting edema. There are no significant effusions. Mild bibasilar atelectasis is present. No significant airspace consolidation is present. The visualized soft tissues and bony thorax are unremarkable. IMPRESSION: 1. Cardiomegaly and mild edema compatible with congestive heart failure. 2. Mild  bibasilar airspace disease likely reflects atelectasis. Electronically Signed   By: San Morelle M.D.   On: 05/22/2016 09:04    ECG: Sinus rhythm with first-degree AV block (old) and minor intraventricular conduction delay; diffuse ST segment depression and T-wave inversions are new from previous tracings  TELEMETRY: NSR  IMPRESSION: 1. Acute on chronic diastolic heart failure 2. Severe iron deficiency anemia 3. CAD s/p CABG, normal very recent perfusion study 4. Abnormal ECG suggesting widespread ischemia, likely anemia related 5. Hypertension, well controlled 6. Type 2 diabetes mellitus, controlled 7. Hypercholesterolemia, statin intolerant 8. Acute renal insufficiency, likely prerenal  RECOMMENDATION: 1. Heart failure exacerbation and ECG changes are likely secondary to severe anemia 2. Anticipate improvement in heart failure following transfusion and correction of anemia, but need to monitor closely for worsening respiratory status while receiving transfusion 3. Slow diuresis with close monitoring of renal function following transfusion 4. Hold aspirin, evaluate for source of GI bleeding. Consider upper GI source such as reflux esophagitis, based on his symptoms and the absence of major pathology on relatively recent colonoscopy 5. His metoprolol has been held for low blood pressure, watch carefully for rebound angina and rebound hypertension. 6. He just had an echocardiogram on April 12. I do not think we need to repeat this.  Time Spent Directly with Patient: 45 minutes  Sanda Klein, MD, [facility] [phone] office [phone] pager   05/22/2016, 1:13 PM

## 2016-05-22 NOTE — ED Notes (Signed)
Pt continent and A&O x 4. Per Dr. Aggie Moats, will allow pt to use urinal to void rather than applying condom catheter, strict intake and output will be measured.

## 2016-05-22 NOTE — H&P (Addendum)
[facility] History and Physical  William [name] TDD:[id number] DOB: 06-Sep-1950 DOA: 05/22/2016  Referring physician:  PCP: Tammi Sou, MD   Chief Complaint: "I would walk and get so short of breath."  HPI: William [name] is a 66 y.o. male  with past medical history significant for AAA, diabetes, high blood pressure, microscopic hematuria, coronary artery disease who presents emergency room with chief complaint of dyspnea on exertion. Patient has had dyspnea on exertion significantly for the last 7 days. Patient had again some fluid and had recently been put on torsemide. This was initially effective but then started to lose effect. Patient's wife called his cardiologist on Friday and just as his heart medications. Patient's non diuretic med doses were cut in half and the diuretic was increased. Patient did not improve. The day of admission patient was so tired he could not even tie his shoes without becoming dyspneic. Patient came to the emergency room for evaluation.  Per patient's wife and wife he is compliant with all medications. No recent illness. No other symptoms.  ED course: Hemoglobin found to be acutely low. Blood transfusion initiated. Chest x-ray showed pulmonary edema. Blood work showed acute kidney injury. Hospitalist consulted for admission.  Review of Systems:  As per HPI otherwise 10 point review of systems negative.    Past Medical History:  Diagnosis Date  . AAA (abdominal aortic aneurysm) ([place]) 03/2014   3.2 cm on MR abd.  F/u aortic u/s 06/2014 showed 3.0 x 3.1 cm AAA: recheck 2 yrs recommended (followed by cardiologist)  . Bilateral renal cysts    simple (03/2014 MRI)  . CAD (coronary artery disease)   . Diabetes mellitus with complication ([place]) 62/3762   A1c 6.8%  . Hyperlipemia, mixed   . Hypertension    Cr bump 04/01/16 so I changed benicar-hct to benicar plain and added amlodipine 5 mg.  . Microscopic hematuria    Eval unremarkable by Dr.  Eulogio Ditch.  [name] NASH (nonalcoholic steatohepatitis)    Fatty liver on MR abd 03/2014, + hx of elevated transaminases and bili: followed by Dr. [name].  . Obesity    Past Surgical History:  Procedure Laterality Date  . CARDIAC CATHETERIZATION    . CARDIOVASCULAR STRESS TEST  01/17/2012; 05/04/16   Normal stress nuclear study x 2 (2018--Normal perfusion. LVEF 66% with normal wall motion. This is a low risk study).  . CERVICAL DISCECTOMY  1992  . COLONOSCOPY W/ POLYPECTOMY  09/10/2014   Polypectomy x 2: recall 5 yrs (Dr. [name]).  . CORONARY ANGIOPLASTY    . CORONARY ARTERY BYPASS GRAFT  03/2006  . VASECTOMY  1977   Social History:  reports that he has quit smoking. His smoking use included Cigarettes. He has never used smokeless tobacco. He reports that he does not drink alcohol or use drugs.  Allergies  Allergen Reactions  . Statins Other (See Comments)    Liver enzymes go up whenever on them    Family History  Problem Relation Age of Onset  . Hypertension Mother   . Cancer - Other Mother     liver cancer  . Heart disease Father   . Heart attack Father   . Cancer - Lung Father   . Diabetes Father   . Liver disease Sister   . Heart disease Brother     CABG 20 YEARS AGO  . Hypertension Brother   . Mesothelioma Brother     HALF-BROTHER  . Cancer - Lung Brother   . Cancer -  Lung Sister     HALF-SISTER  . Cancer - Other Sister     KIDNEY  (HALF-SISTER)  . Liver disease Brother   . Heart disease Brother     CABG-2012     Prior to Admission medications   Medication Sig Start Date End Date Taking? Authorizing Provider  aspirin 81 MG tablet Take 81 mg by mouth daily.   Yes Historical Provider, MD  cholecalciferol (VITAMIN D) 1000 UNITS tablet Take 1,000 Units by mouth daily.   Yes Historical Provider, MD  fluticasone (FLONASE) 50 MCG/ACT nasal spray Place 2 sprays into both nostrils daily. Patient taking differently: Place 2 sprays into both nostrils daily as needed for  allergies.  01/01/16  Yes Benjamine Mola, FNP  Garlic 3557 MG CAPS Take 1 capsule by mouth 2 (two) times daily.   Yes Historical Provider, MD  metFORMIN (GLUCOPHAGE) 500 MG tablet Take 1 tablet (500 mg total) by mouth 2 (two) times daily with a meal. 04/27/16  Yes Troy Sine, MD  metoprolol (LOPRESSOR) 100 MG tablet TAKE 1 TABLET BY MOUTH TWICE DAILY Patient taking differently: TAKE 1/2 TABLET BY MOUTH TWICE DAILY 12/23/15  Yes Troy Sine, MD  olmesartan (BENICAR) 20 MG tablet Take 1 tablet (20 mg total) by mouth daily. Patient taking differently: Take 10 mg by mouth daily.  04/27/16  Yes Troy Sine, MD  omega-3 acid ethyl esters (LOVAZA) 1 g capsule TAKE 2 CAPSULES BY MOUTH ONCE DAILY Patient taking differently: TAKE 1 CAPSULES BY MOUTH TWICE DAILY 03/24/16  Yes Troy Sine, MD  torsemide (DEMADEX) 20 MG tablet Take 1 tablet (20 mg total) by mouth daily. Patient taking differently: Take 60 mg by mouth daily.  04/27/16 07/26/16 Yes Troy Sine, MD   Physical Exam: Vitals:   05/22/16 0800 05/22/16 0815 05/22/16 0833 05/22/16 0845  BP: (!) 98/48 (!) 103/51 111/67 104/85  Pulse: 64 68 65 64  Resp: 18 (!) 24 16 14   Temp:      TempSrc:      SpO2: 95% 98% 100%   Weight:      Height:        Wt Readings from Last 3 Encounters:  05/22/16 125.9 kg (277 lb 8 oz)  05/05/16 120.9 kg (266 lb 8 oz)  05/04/16 124.3 kg (274 lb)    General:  Appears calm and comfortable, A&Ox3 Eyes:  PERRL, EOMI, normal lids, iris ENT:  grossly normal hearing, lips & tongue Neck:  no LAD, masses or thyromegaly Cardiovascular:  RRR, no r/g. 1+ non pitting LE edema. Pos aortic harsh murmur Respiratory:  Tachypnea, rales in low 2/3 of lung fields, no wheezes or rhonchi Abdomen:  soft, ntnd Skin:  no rash or induration seen on limited exam Musculoskeletal:  grossly normal tone BUE/BLE Psychiatric:  grossly normal mood and affect, speech fluent and appropriate Neurologic:  CN 2-12 grossly intact, moves  all extremities in coordinated fashion.          Labs on Admission:  Basic Metabolic Panel:  Recent Labs Lab 05/22/16 0713  NA 133*  K 3.8  CL 97*  CO2 25  GLUCOSE 138*  BUN 28*  CREATININE 2.06*  CALCIUM 8.9   Liver Function Tests:  Recent Labs Lab 05/22/16 0713  AST 36  ALT 29  ALKPHOS 74  BILITOT 4.1*  PROT 6.7  ALBUMIN 2.9*   No results for input(s): LIPASE, AMYLASE in the last 168 hours. No results for input(s): AMMONIA in the last  168 hours. CBC:  Recent Labs Lab 05/22/16 0713  WBC 7.6  NEUTROABS 3.5  HGB 6.3*  HCT 20.1*  MCV 80.1  PLT 152   Cardiac Enzymes: No results for input(s): CKTOTAL, CKMB, CKMBINDEX, TROPONINI in the last 168 hours.  BNP (last 3 results)  Recent Labs  05/04/16 1012 05/22/16 0713  BNP 574.6* 765.6*    ProBNP (last 3 results) No results for input(s): PROBNP in the last 8760 hours.   Serum creatinine: 2.06 mg/dL (H) 05/22/16 9024 Estimated creatinine clearance: 46.3 mL/min (A)  CBG: No results for input(s): GLUCAP in the last 168 hours.  Radiological Exams on Admission: Dg Chest 2 View  Result Date: 05/22/2016 CLINICAL DATA:  Shortness of breath.  Lower extremity swelling. EXAM: CHEST  2 VIEW COMPARISON:  Two-view chest x-ray 04/27/2006 FINDINGS: Heart is enlarged. A diffuse interstitial pattern is now present, suggesting edema. There are no significant effusions. Mild bibasilar atelectasis is present. No significant airspace consolidation is present. The visualized soft tissues and bony thorax are unremarkable. IMPRESSION: 1. Cardiomegaly and mild edema compatible with congestive heart failure. 2. Mild bibasilar airspace disease likely reflects atelectasis. Electronically Signed   By: San Morelle M.D.   On: 05/22/2016 09:04    EKG: Independently reviewed. Mild ST depression in lead 1 and lead 2, first-degree AV block, incomplete right bundle branch block, no STEMI; compared to previous EKG ST depressions  are new  Assessment/Plan Principal Problem:   GI bleed Active Problems:   Essential hypertension   Pulmonary edema   Acute on chronic renal failure (HCC)   DM II (diabetes mellitus, type II), controlled (HCC)  Acute blood loss anemia Most recent hemoglobin OP 14.8 No History of anemia Getting transfuse History of bleed - none GI consult was ordered by EDP Avoid NSAIDs Hold the following medications ASA  Acute Pulm Edema Serial Trop No STEMI on EKG, ST depressions seen, will likely be better with blood, EKG in AM Trial of ntg paste Albuterol Will be more aggressive once blood is in due to soft BP, pt currently stable but in guarded condition Prn bipap CXR  In AM ECHO limited tomorrow Pt took torsemide prior to coming to ED, will monitor and may redose with lasix later in day Cardiology consulted  AKI Likely due to ATN from low hgb and resp distress Baseline Cr wnl but CKD II , Cr on admit 2.06 0 of normal saline given in the emergency room Gentle hydration overnight Checking magnesium and phosphorus Urine labs ordered to calculate fractional excretion of urea Hold benicar Check PVR Low threshold for neprho consult Condom cath Strict I&Os  Elevated total bili Hx of nash Will monitor intermitently Nl LFTs Checking direct bili  Hx of hematuria Awaiting UA  Hypertension When necessary hydralazine 10 mg IV as needed for severe blood pressure BP soft so holding lopressor  DM Q4 SSI Hold Metformin  CAD Serial trop Holding ASA  Code Status: FULL  DVT Prophylaxis: SCDs Family Communication: wife and son at bedside Disposition Plan: Pending Improvement  Status: inpt sdu  Elwin Mocha, MD Family Medicine [facility] [email or web address] Password [email or web address]

## 2016-05-22 NOTE — ED Notes (Signed)
Blood consent form signed by pt and witnessed by this RN, consent by the bedside.

## 2016-05-22 NOTE — ED Notes (Signed)
Patient transported to X-ray 

## 2016-05-22 NOTE — ED Notes (Signed)
Dr Ray at bedside. 

## 2016-05-22 NOTE — ED Notes (Signed)
Will administer pt breathing treatment when provider is finished with assessment.

## 2016-05-22 NOTE — ED Provider Notes (Signed)
[facility] DEPT Provider Note   CSN: [id number] Arrival date & time: 05/22/16  8016     History   Chief Complaint Chief Complaint  Patient presents with  . Shortness of Breath    HPI William [name] is a 66 y.o. male.  HPI William [name] is a 66 y.o. male with hx of AAA, CAD, DM, HTN,  NASH, presents to ED with complaint of swelling and shortness of breath. PT reports swelling to both legs, abdomen. Difficulty laying flat. Reports exertional SOB and chest pain. Pt states his symptoms started intially two months ago when his PCP noted increase in creatinine. At that time he was taken off his HCTZ. He states shortly after began noticing weight gain and LE swelling. He was started on torsemide which improved swelling. Pt was doing well and had a full work up done including Echo and stress test, all were unremarkable 3 wks ago. He states about a week ago he has noticed swelling was worsening. Since then he has had increase in torsemide from 26m to 40 and now to 60. States he feels like it is not working. He reports swelling and weight gain continues and reports that he is not urinating as much as he should be. He states he feels normal and asymptomatic when sitting up at rest. He is unable to sleep.   Past Medical History:  Diagnosis Date  . AAA (abdominal aortic aneurysm) ([facility] 03/2014   3.2 cm on MR abd.  F/u aortic u/s 06/2014 showed 3.0 x 3.1 cm AAA: recheck 2 yrs recommended (followed by cardiologist)  . Bilateral renal cysts    simple (03/2014 MRI)  . CAD (coronary artery disease)   . Diabetes mellitus with complication ([facility] 055/3748  A1c 6.8%  . Hyperlipemia, mixed   . Hypertension    Cr bump 04/01/16 so I changed benicar-hct to benicar plain and added amlodipine 5 mg.  . Microscopic hematuria    Eval unremarkable by Dr. DEulogio Ditch  .Marland KitchenNASH (nonalcoholic steatohepatitis)    Fatty liver on MR abd 03/2014, + hx of elevated transaminases and bili: followed by Dr. MCollene Mares  .  Obesity     Patient Active Problem List   Diagnosis Date Noted  . Elevated LFTs 01/17/2015  . Moderate obesity 11/15/2013  . Abdominal aortic aneurysm ([facility] 03/19/2013  . Hematuria 03/19/2013  . Morbid obesity ([facility] 08/24/2012  . CAD in native artery 08/24/2012  . Metabolic syndrome 027/08/8673 . Essential hypertension 08/24/2012  . Mixed hyperlipidemia 08/24/2012    Past Surgical History:  Procedure Laterality Date  . CARDIAC CATHETERIZATION    . CARDIOVASCULAR STRESS TEST  01/17/2012; 05/04/16   Normal stress nuclear study x 2 (2018--Normal perfusion. LVEF 66% with normal wall motion. This is a low risk study).  . CERVICAL DISCECTOMY  1992  . COLONOSCOPY W/ POLYPECTOMY  09/10/2014   Polypectomy x 2: recall 5 yrs (Dr. MCollene Mares.  . CORONARY ANGIOPLASTY    . CORONARY ARTERY BYPASS GRAFT  03/2006  . VASECTOMY  1977       Home Medications    Prior to Admission medications   Medication Sig Start Date End Date Taking? Authorizing Provider  aspirin 81 MG tablet Take 81 mg by mouth daily.   Yes Historical Provider, MD  cholecalciferol (VITAMIN D) 1000 UNITS tablet Take 1,000 Units by mouth daily.   Yes Historical Provider, MD  fluticasone (FLONASE) 50 MCG/ACT nasal spray Place 2 sprays into both nostrils daily. Patient taking differently: Place  2 sprays into both nostrils daily as needed for allergies.  01/01/16  Yes Benjamine Mola, FNP  Garlic 3235 MG CAPS Take 1 capsule by mouth 2 (two) times daily.   Yes Historical Provider, MD  metFORMIN (GLUCOPHAGE) 500 MG tablet Take 1 tablet (500 mg total) by mouth 2 (two) times daily with a meal. 04/27/16  Yes Troy Sine, MD  metoprolol (LOPRESSOR) 100 MG tablet TAKE 1 TABLET BY MOUTH TWICE DAILY Patient taking differently: TAKE 1/2 TABLET BY MOUTH TWICE DAILY 12/23/15  Yes Troy Sine, MD  olmesartan (BENICAR) 20 MG tablet Take 1 tablet (20 mg total) by mouth daily. Patient taking differently: Take 10 mg by mouth daily.  04/27/16  Yes  Troy Sine, MD  omega-3 acid ethyl esters (LOVAZA) 1 g capsule TAKE 2 CAPSULES BY MOUTH ONCE DAILY Patient taking differently: TAKE 1 CAPSULES BY MOUTH TWICE DAILY 03/24/16  Yes Troy Sine, MD  torsemide (DEMADEX) 20 MG tablet Take 1 tablet (20 mg total) by mouth daily. Patient taking differently: Take 60 mg by mouth daily.  04/27/16 07/26/16 Yes Troy Sine, MD    Family History Family History  Problem Relation Age of Onset  . Hypertension Mother   . Cancer - Other Mother     liver cancer  . Heart disease Father   . Heart attack Father   . Cancer - Lung Father   . Diabetes Father   . Liver disease Sister   . Heart disease Brother     CABG 20 YEARS AGO  . Hypertension Brother   . Mesothelioma Brother     HALF-BROTHER  . Cancer - Lung Brother   . Cancer - Lung Sister     HALF-SISTER  . Cancer - Other Sister     KIDNEY  (HALF-SISTER)  . Liver disease Brother   . Heart disease Brother     CABG-2012    Social History Social History  Substance Use Topics  . Smoking status: Former Smoker    Types: Cigarettes  . Smokeless tobacco: Never Used     Comment: QUIT I 1983  . Alcohol use No     Allergies   Statins   Review of Systems Review of Systems  Constitutional: Negative for chills and fever.  Respiratory: Positive for chest tightness and shortness of breath. Negative for cough.   Cardiovascular: Positive for chest pain and leg swelling. Negative for palpitations.  Gastrointestinal: Positive for abdominal distention. Negative for abdominal pain, diarrhea, nausea and vomiting.  Genitourinary: Negative for dysuria, frequency, hematuria and urgency.  Musculoskeletal: Negative for arthralgias, myalgias, neck pain and neck stiffness.  Skin: Negative for rash.  Allergic/Immunologic: Negative for immunocompromised state.  Neurological: Negative for dizziness, weakness, light-headedness, numbness and headaches.  All other systems reviewed and are  negative.    Physical Exam Updated Vital Signs BP (!) 110/55   Pulse 66   Temp 98.5 F (36.9 C) (Oral)   Resp 20   Ht 5' 9"  (1.753 m)   Wt 125.9 kg   SpO2 98%   BMI 40.98 kg/m   Physical Exam  Constitutional: He is oriented to person, place, and time. He appears well-developed and well-nourished. No distress.  HENT:  Head: Normocephalic and atraumatic.  Eyes: Conjunctivae are normal.  Neck: Neck supple.  Cardiovascular: Normal rate, regular rhythm and normal heart sounds.   Pulmonary/Chest: Effort normal. No respiratory distress. He has no wheezes. He has rales.  Rales at bases  Abdominal: Soft. Bowel  sounds are normal. He exhibits no distension. There is no tenderness. There is no rebound.  Musculoskeletal:  3+ peripheral edema  Neurological: He is alert and oriented to person, place, and time.  Skin: Skin is warm and dry.  Nursing note and vitals reviewed.    ED Treatments / Results  Labs (all labs ordered are listed, but only abnormal results are displayed) Labs Reviewed  CBC WITH DIFFERENTIAL/PLATELET - Abnormal; Notable for the following:       Result Value   RBC 2.51 (*)    Hemoglobin 6.3 (*)    HCT 20.1 (*)    MCH 25.1 (*)    RDW 16.3 (*)    Monocytes Absolute 1.1 (*)    All other components within normal limits  COMPREHENSIVE METABOLIC PANEL - Abnormal; Notable for the following:    Sodium 133 (*)    Chloride 97 (*)    Glucose, Bld 138 (*)    BUN 28 (*)    Creatinine, Ser 2.06 (*)    Albumin 2.9 (*)    Total Bilirubin 4.1 (*)    GFR calc non Af Amer 32 (*)    GFR calc Af Amer 37 (*)    All other components within normal limits  BRAIN NATRIURETIC PEPTIDE - Abnormal; Notable for the following:    B Natriuretic Peptide 765.6 (*)    All other components within normal limits  RETICULOCYTES - Abnormal; Notable for the following:    RBC. 2.26 (*)    All other components within normal limits  POC OCCULT BLOOD, ED - Abnormal; Notable for the following:     Fecal Occult Bld POSITIVE (*)    All other components within normal limits  OCCULT BLOOD X 1 CARD TO LAB, STOOL  VITAMIN B12  FOLATE  IRON AND TIBC  FERRITIN  APTT  PROTIME-INR  I-STAT TROPOININ, ED  TYPE AND SCREEN  PREPARE RBC (CROSSMATCH)    EKG  EKG Interpretation  Date/Time:  Sunday May 22 2016 07:25:14 EDT Ventricular Rate:  65 PR Interval:    QRS Duration: 116 QT Interval:  478 QTC Calculation: 498 R Axis:   -25 Text Interpretation:  Sinus rhythm Prolonged PR interval Incomplete right bundle branch block Low voltage, precordial leads Consider anterior infarct Nonspecific T abnormalities, lateral leads Confirmed by Christy Gentles  MD, DONALD ([id number]) on 05/22/2016 7:29:23 AM       Radiology Dg Chest 2 View  Result Date: 05/22/2016 CLINICAL DATA:  Shortness of breath.  Lower extremity swelling. EXAM: CHEST  2 VIEW COMPARISON:  Two-view chest x-ray 04/27/2006 FINDINGS: Heart is enlarged. A diffuse interstitial pattern is now present, suggesting edema. There are no significant effusions. Mild bibasilar atelectasis is present. No significant airspace consolidation is present. The visualized soft tissues and bony thorax are unremarkable. IMPRESSION: 1. Cardiomegaly and mild edema compatible with congestive heart failure. 2. Mild bibasilar airspace disease likely reflects atelectasis. Electronically Signed   By: San Morelle M.D.   On: 05/22/2016 09:04    Procedures Procedures (including critical care time)  Medications Ordered in ED Medications - No data to display   Initial Impression / Assessment and Plan / ED Course  I have reviewed the triage vital signs and the nursing notes.  Pertinent labs & imaging results that were available during my care of the patient were reviewed by me and considered in my medical decision making (see chart for details).    Pt in ED with swelling and sob. Will check labs, CXR. Pt  in NAD at this time. EKG showing new ST  changes/depressions, concerning for ischemia.  8:21 AM Patient's hemoglobin is back at 6.3. I added type and screen, Hemoccult, anemia panel. Patient denies any black or tarry stools. Last bowel movement was yesterday and normal. He does complain of some abdominal discomfort in the last several days and generalized bloating. He reports he had a colonoscopy a yar and a half ago and had 2 polyps that were removed, otherwise was unremarkable colonoscopy.  Hemoccult is positive. Patient denies any abdominal pain there is no tenderness on exam. He does appear to be bloated. I ordered him 2 units of packed red blood cells. Blood pressure is 103/56. He denies any dizziness upon standing and was able to ambulate. Held treatment for CHF, given BP in 01-749S systolic. PT has no respiratory issues at this time.   9:11 AM Spoke with hospitalist, will admit.  Spoke with Dr. [name], will see in consult.  I   Vitals:   05/22/16 1205 05/22/16 1230 05/22/16 1300 05/22/16 1445  BP: (!) 99/48 (!) 114/53 (!) 102/43 118/65  Pulse: 64 68 67 72  Resp: 16 18 15 16   Temp: 98.6 F (37 C)   98.7 F (37.1 C)  TempSrc: Oral   Oral  SpO2: 96% 97% 98% 98%  Weight:      Height:        Final Clinical Impressions(s) / ED Diagnoses   Final diagnoses:  Anemia, unspecified type  Gastrointestinal hemorrhage, unspecified gastrointestinal hemorrhage type  Acute pulmonary edema [facility])  Renal insufficiency    New Prescriptions Current Discharge Medication List       Jeannett Senior, PA-C 05/22/16 1502    Jeannett Senior, PA-C 05/22/16 1504    Pattricia Boss, MD 05/22/16 1600

## 2016-05-22 NOTE — ED Notes (Signed)
Pt. returned from XR. 

## 2016-05-22 NOTE — ED Notes (Signed)
Per MD repeat ekg.

## 2016-05-22 NOTE — ED Notes (Signed)
Admitting provider at bedside.

## 2016-05-23 ENCOUNTER — Inpatient Hospital Stay (HOSPITAL_COMMUNITY): Payer: 59

## 2016-05-23 DIAGNOSIS — K922 Gastrointestinal hemorrhage, unspecified: Principal | ICD-10-CM

## 2016-05-23 DIAGNOSIS — E782 Mixed hyperlipidemia: Secondary | ICD-10-CM

## 2016-05-23 DIAGNOSIS — N183 Chronic kidney disease, stage 3 (moderate): Secondary | ICD-10-CM

## 2016-05-23 LAB — PROTIME-INR
INR: 1.77
INR: 1.78
Prothrombin Time: 20.8 seconds — ABNORMAL HIGH (ref 11.4–15.2)
Prothrombin Time: 20.9 seconds — ABNORMAL HIGH (ref 11.4–15.2)

## 2016-05-23 LAB — GLUCOSE, CAPILLARY
GLUCOSE-CAPILLARY: 98 mg/dL (ref 65–99)
Glucose-Capillary: 99 mg/dL (ref 65–99)
Glucose-Capillary: 98 mg/dL (ref 65–99)
Glucose-Capillary: 81 mg/dL (ref 65–99)
Glucose-Capillary: 105 mg/dL — ABNORMAL HIGH (ref 65–99)
Glucose-Capillary: 107 mg/dL — ABNORMAL HIGH (ref 65–99)

## 2016-05-23 LAB — CBC
HCT: 23.2 % — ABNORMAL LOW (ref 39.0–52.0)
HCT: 25.4 % — ABNORMAL LOW (ref 39.0–52.0)
Hemoglobin: 7.3 g/dL — ABNORMAL LOW (ref 13.0–17.0)
Hemoglobin: 8.1 g/dL — ABNORMAL LOW (ref 13.0–17.0)
MCH: 25.5 pg — ABNORMAL LOW (ref 26.0–34.0)
MCH: 25.6 pg — ABNORMAL LOW (ref 26.0–34.0)
MCHC: 31.5 g/dL (ref 30.0–36.0)
MCHC: 31.9 g/dL (ref 30.0–36.0)
MCV: 81.1 fL (ref 78.0–100.0)
MCV: 80.1 fL (ref 78.0–100.0)
PLATELETS: 115 10*3/uL — AB (ref 150–400)
Platelets: 106 10*3/uL — ABNORMAL LOW (ref 150–400)
RBC: 2.86 MIL/uL — ABNORMAL LOW (ref 4.22–5.81)
RBC: 3.17 MIL/uL — ABNORMAL LOW (ref 4.22–5.81)
RDW: 15.5 % (ref 11.5–15.5)
RDW: 15.6 % — AB (ref 11.5–15.5)
WBC: 6.2 10*3/uL (ref 4.0–10.5)
WBC: 6.5 10*3/uL (ref 4.0–10.5)

## 2016-05-23 LAB — HEPATIC FUNCTION PANEL
ALK PHOS: 71 U/L (ref 38–126)
ALT: 28 U/L (ref 17–63)
AST: 40 U/L (ref 15–41)
Albumin: 3 g/dL — ABNORMAL LOW (ref 3.5–5.0)
BILIRUBIN DIRECT: 0.9 mg/dL — AB (ref 0.1–0.5)
BILIRUBIN INDIRECT: 6.7 mg/dL — AB (ref 0.3–0.9)
BILIRUBIN TOTAL: 7.6 mg/dL — AB (ref 0.3–1.2)
Total Protein: 7 g/dL (ref 6.5–8.1)

## 2016-05-23 LAB — UREA NITROGEN, URINE: UREA NITROGEN UR: 450 mg/dL

## 2016-05-23 LAB — BASIC METABOLIC PANEL
Anion gap: 7 (ref 5–15)
BUN: 25 mg/dL — AB (ref 6–20)
CALCIUM: 9 mg/dL (ref 8.9–10.3)
CO2: 28 mmol/L (ref 22–32)
Chloride: 102 mmol/L (ref 101–111)
Creatinine, Ser: 1.62 mg/dL — ABNORMAL HIGH (ref 0.61–1.24)
GFR calc Af Amer: 49 mL/min — ABNORMAL LOW (ref >60)
GFR, EST NON AFRICAN AMERICAN: 43 mL/min — AB (ref >60)
GLUCOSE: 95 mg/dL (ref 65–99)
POTASSIUM: 4.2 mmol/L (ref 3.5–5.1)
SODIUM: 137 mmol/L (ref 135–145)

## 2016-05-23 LAB — PREPARE RBC (CROSSMATCH)

## 2016-05-23 LAB — APTT: APTT: 39 s — AB (ref 24–36)

## 2016-05-23 MED ORDER — FUROSEMIDE 10 MG/ML IJ SOLN
20.0000 mg | Freq: Once | INTRAMUSCULAR | Status: AC
  Administered 2016-05-23: 20 mg via INTRAVENOUS
  Filled 2016-05-23: qty 2

## 2016-05-23 MED ORDER — INSULIN ASPART 100 UNIT/ML ~~LOC~~ SOLN: 0.0000 [IU] | Freq: Every day | SUBCUTANEOUS | Status: DC

## 2016-05-23 MED ORDER — INSULIN ASPART 100 UNIT/ML ~~LOC~~ SOLN
0.0000 [IU] | Freq: Three times a day (TID) | SUBCUTANEOUS | Status: DC
  Administered 2016-05-26 – 2016-05-28 (×3): 1 [IU] via SUBCUTANEOUS

## 2016-05-23 NOTE — Plan of Care (Signed)
Problem: Safety: Goal: Ability to remain free from injury will improve Outcome: Progressing Patient uses call bell when assistance needed,  Pt verbalized understanding of importance of calling for assistance to reduce risk of falls.  Progressing towards goal.

## 2016-05-23 NOTE — Progress Notes (Signed)
Patient had an uneventful night.  No changes in assessment noted.  Patient ambulated in room to chair with minimal assist, steady gait observed.  No complaints of pain or discomfort voiced.  No shortness of breath reported, pt is on room air.  Cardiac rhythm remained unchanged.  CBGs on PM shift required no coverage.  Patient has been NPO.  All questions and concerns addressed.  Will continue to monitor and report off to day RN.

## 2016-05-23 NOTE — Progress Notes (Signed)
TEAM 1 - Stepdown/ICU TEAM  CHONG WOJDYLA  HYW:[id number] DOB: March 08, 1950 DOA: 05/22/2016 PCP: Tammi Sou, MD    Brief Narrative:  66 yo male w/ history of AAA, diabetes, HTN, microscopic hematuria, and CAD who presented to the ED w/ dyspnea on exertion worsening over 7 days.  The day of admission patient could not even tie his shoes without becoming dyspneic.   In the ED his Hgb found to be acutely low at 6.3 w/ guaiac + stool. Chest x-ray showed pulmonary edema. Blood work showed acute kidney injury.   Subjective: The patient is resting comfortably in bed.  He denies dyspnea with rest but he has not yet been up out of bed.  He denies chest pain shortness breath fevers chills nausea vomiting or abdominal pain at this time.  He is quite hungry.  Assessment & Plan:  GIB - ?Upper  to have endoscopic evaluation once deemed stable - would prefer to see hemoglobin consistently at 8.0 or greater prior to this, if possible  Acute blood loss anemia - Fe deficiency  Goal Hgb is 8.0 in setting of CAD - transfuse as needed - s/p 2U PRBC thus far per RN/pt (though only 1 showing in Epic) - transfuse another unit today to obtain goal of 8 or >  Recent Labs Lab 05/22/16 0713 05/22/16 1810 05/23/16 0259  HGB 6.3* 7.5* 7.3*    Acute on chronic diastolic CHF - Acute pulmonary edema Appears well compensated at this time - follow for decompensation w/ blood transfusion   Acute kidney injury Appears pre-renal - improving w/ volume expansion - avoid contrast/ACEi/ARB  Recent Labs Lab 05/22/16 0713 05/23/16 0259  CREATININE 2.06* 1.62*    CAD s/p CABG 2008 Cardiology is following - no evidence of ACS presently   HTN BP well controlled in setting of blood loss  DM CBG currently well controlled   Morbid obesity - Body mass index is 41.27 kg/m.   DVT prophylaxis: SCDs Code Status: FULL CODE Family Communication: Spoke with family at bedside Disposition Plan:  SDU  Consultants:  GI Cardiology  Procedures: none  Antimicrobials:  none   Objective: Blood pressure 100/62, pulse 71, temperature 98.3 F (36.8 C), temperature source Oral, resp. rate 18, height 5' 9"  (1.753 m), weight 126.8 kg (279 lb 8 oz), SpO2 96 %.  Intake/Output Summary (Last 24 hours) at 05/23/16 1123 Last data filed at 05/23/16 0851  Gross per 24 hour  Intake              335 ml  Output             1550 ml  Net            -1215 ml   Filed Weights   05/22/16 0641 05/23/16 0408  Weight: 125.9 kg (277 lb 8 oz) 126.8 kg (279 lb 8 oz)    Examination: General: No acute respiratory distress Lungs: Clear to auscultation bilaterally without wheezes or crackles Cardiovascular: Regular rate and rhythm with 2/6 holosystolic M  Abdomen: Nontender, nondistended, soft, bowel sounds positive, no rebound, no ascites, no appreciable mass Extremities: 1+ B LE edema   CBC:  Recent Labs Lab 05/22/16 0713 05/22/16 1810 05/23/16 0259  WBC 7.6 7.2 6.2  NEUTROABS 3.5 3.5  --   HGB 6.3* 7.5* 7.3*  HCT 20.1* 24.0* 23.2*  MCV 80.1 80.8 81.1  PLT 152 137* 485*   Basic Metabolic Panel:  Recent Labs Lab 05/22/16 0713 05/22/16 1434 05/23/16 0259  NA 133*  --  137  K 3.8  --  4.2  CL 97*  --  102  CO2 25  --  28  GLUCOSE 138*  --  95  BUN 28*  --  25*  CREATININE 2.06*  --  1.62*  CALCIUM 8.9  --  9.0  MG  --  2.3  --   PHOS  --  4.3  --    GFR: Estimated Creatinine Clearance: 59.1 mL/min (A) (by C-G formula based on SCr of 1.62 mg/dL (H)).  Liver Function Tests:  Recent Labs Lab 05/22/16 0713  AST 36  ALT 29  ALKPHOS 74  BILITOT 4.1*  PROT 6.7  ALBUMIN 2.9*    Coagulation Profile:  Recent Labs Lab 05/22/16 0653 05/23/16 0259  INR 1.74 1.77    Cardiac Enzymes:  Recent Labs Lab 05/22/16 1434  TROPONINI 0.11*    HbA1C: Hgb A1c MFr Bld  Date/Time Value Ref Range Status  04/01/2016 09:52 AM 6.7 (H) 4.6 - 6.5 % Final    Comment:    Glycemic  Control Guidelines for People with Diabetes:Non Diabetic:  <6%Goal of Therapy: <7%Additional Action Suggested:  >8%   12/02/2015 10:20 AM 6.6 (H) 4.6 - 6.5 % Final    Comment:    Glycemic Control Guidelines for People with Diabetes:Non Diabetic:  <6%Goal of Therapy: <7%Additional Action Suggested:  >8%     CBG:  Recent Labs Lab 05/22/16 1621 05/22/16 2004 05/23/16 0036 05/23/16 0413 05/23/16 0848  GLUCAP 91 97 98 99 98    Recent Results (from the past 240 hour(s))  MRSA PCR Screening     Status: None   Collection Time: 05/22/16  1:26 PM  Result Value Ref Range Status   MRSA by PCR NEGATIVE NEGATIVE Final    Comment:        The GeneXpert MRSA Assay (FDA approved for NASAL specimens only), is one component of a comprehensive MRSA colonization surveillance program. It is not intended to diagnose MRSA infection nor to guide or monitor treatment for MRSA infections.      Scheduled Meds: . insulin aspart  0-20 Units Subcutaneous Q4H  . sodium chloride flush  3 mL Intravenous Q12H     LOS: 1 day   Cherene Altes, MD [facility] Office  [phone] Pager - Text Page per Shea Evans as per below:  On-Call/Text Page:      [email or web address]      password [email or web address]  If 7PM-7AM, please contact night-coverage [email or web address] Password [facility] 05/23/2016, 11:23 AM

## 2016-05-23 NOTE — Telephone Encounter (Signed)
Currently admitted. Routed to triage for further f/u.

## 2016-05-23 NOTE — Progress Notes (Signed)
Subjective: Since I last evaluated the patient, he seems to be doing much better. He has been bloated but denies having any obvious melena or hematochezia. His pedal edema has improved with the diuretics. He denies having any nausea, vomiting or abdominal pain. Prior to admission he had some epigastric burning and bloating but that has since improved. He took some Prilosec 20 mg for the reflux. He received 2 units of PRBC''s yesterday.  Objective: Vital signs in last 24 hours: Temp:  [98 F (36.7 C)-98.3 F (36.8 C)] 98.3 F (36.8 C) (04/16 1514) Pulse Rate:  [66-76] 76 (04/16 1514) Resp:  [13-24] 19 (04/16 1514) BP: (81-155)/(33-63) 155/55 (04/16 1514) SpO2:  [93 %-100 %] 100 % (04/16 1514) Weight:  [126.8 kg (279 lb 8 oz)] 126.8 kg (279 lb 8 oz) (04/16 0408) Last BM Date: 05/21/16  Intake/Output from previous day: 04/15 0701 - 04/16 0700 In: 670 [Blood:670] Out: 1150 [Urine:1150] Intake/Output this shift: Total I/O In: 240 [P.O.:240] Out: 400 [Urine:400]  General appearance: alert, cooperative, appears stated age, fatigued, no distress, morbidly obese and pale Resp: clear to auscultation bilaterally Cardio: regular rate and rhythm, S1, S2 normal, 1/6 SEM, click, rub or gallop GI: soft, morbidly obesenon-tender; bowel sounds normal; no masses,  no organomegaly Extremities: extremities with 1+ pedal edema, atraumatic no cyanosis  Lab Results:  Recent Labs  05/22/16 0713 05/22/16 1810 05/23/16 0259  WBC 7.6 7.2 6.2  HGB 6.3* 7.5* 7.3*  HCT 20.1* 24.0* 23.2*  PLT 152 137* 115*   BMET  Recent Labs  05/22/16 0713 05/23/16 0259  NA 133* 137  K 3.8 4.2  CL 97* 102  CO2 25 28  GLUCOSE 138* 95  BUN 28* 25*  CREATININE 2.06* 1.62*  CALCIUM 8.9 9.0   LFT  Recent Labs  05/22/16 0713 05/22/16 1434  PROT 6.7  --   ALBUMIN 2.9*  --   AST 36  --   ALT 29  --   ALKPHOS 74  --   BILITOT 4.1*  --   BILIDIR  --  0.7*   PT/INR  Recent Labs  05/22/16 0653  05/23/16 0259  LABPROT 20.5* 20.8*  INR 1.74 1.77   Studies/Results: Dg Chest 2 View  Result Date: 05/22/2016 CLINICAL DATA:  Shortness of breath.  Lower extremity swelling. EXAM: CHEST  2 VIEW COMPARISON:  Two-view chest x-ray 04/27/2006 FINDINGS: Heart is enlarged. A diffuse interstitial pattern is now present, suggesting edema. There are no significant effusions. Mild bibasilar atelectasis is present. No significant airspace consolidation is present. The visualized soft tissues and bony thorax are unremarkable. IMPRESSION: 1. Cardiomegaly and mild edema compatible with congestive heart failure. 2. Mild bibasilar airspace disease likely reflects atelectasis. Electronically Signed   By: San Morelle M.D.   On: 05/22/2016 09:04   Dg Chest Port 1 View  Result Date: 05/23/2016 CLINICAL DATA:  Pulmonary edema. EXAM: PORTABLE CHEST 1 VIEW COMPARISON:  Radiographs of May 22, 2016. FINDINGS: Stable cardiomegaly. Status post coronary artery bypass graft. Stable bilateral perihilar and basilar opacities are noted concerning for pulmonary edema. No pneumothorax or pleural effusion is noted. Bony thorax is unremarkable. IMPRESSION: Stable bilateral pulmonary edema. Electronically Signed   By: Marijo Conception, M.D.   On: 05/23/2016 07:22   Medications: I have reviewed the patient's current medications.  Assessment/Plan: 1) Iron deficiency anemia with guaiac positive stools-will plan an EGD on Wednesday after we get clearance from the cardiology service.   2) Diverticulosis. 3) Morbid obesity/Abnormal LFT's-fatty liver.  Will check a liver panel tomorrow. He will benefit from an elastography of the liver to rule out cirrhosis. 4) Thrombocytopenia.  5) History of colonic polyps.  LOS: 1 day   Taralyn Ferraiolo 05/23/2016, 4:12 PM

## 2016-05-23 NOTE — Consult Note (Signed)
   [facility] Inpatient Consult   05/23/2016  William [name] 1950-11-29 [id number]    Spoke with patient's nurse prior to bedside engagement. Went to bedside to speak with Mr. [name] and wife on behalf of [employer] program for [employer] employees/dependents with [facility] insurance. William [name] is already familiar with [employer] to [employer] program for DM management. Discussed enrollment for Mr. Cooter for DM management. Mr. Convey not ready to commit to signing up [employer] at this time. Discussed that this could be readdressed during his post hospital discharge call. He is agreeable to this. Confirmed best contact number as [phone]. Provided contact information and Link to [employer]. Appreciative of visit.    Marthenia Rolling, MSN-Ed, RN,BSN [facility] Liaison [phone]

## 2016-05-23 NOTE — Progress Notes (Signed)
Progress Note  Patient Name: William [name] Date of Encounter: 05/23/2016  Primary Cardiologist: Ward better; less abdominal bloating; no CP/SOB s/p 2 units PRBC Tx.  Inpatient Medications    Scheduled Meds: . insulin aspart  0-20 Units Subcutaneous Q4H  . sodium chloride flush  3 mL Intravenous Q12H   Continuous Infusions:  PRN Meds: acetaminophen **OR** acetaminophen, hydrALAZINE, ondansetron **OR** ondansetron (ZOFRAN) IV   Vital Signs    Vitals:   05/23/16 0500 05/23/16 0700 05/23/16 0800 05/23/16 0850  BP: (!) 97/50 (!) 107/59 100/62 100/62  Pulse: 66 73 71 71  Resp: 14 18 15 18   Temp:    98.3 F (36.8 C)  TempSrc:    Oral  SpO2: 95% 96% 95% 96%  Weight:      Height:        Intake/Output Summary (Last 24 hours) at 05/23/16 1146 Last data filed at 05/23/16 0851  Gross per 24 hour  Intake              335 ml  Output             1550 ml  Net            -1215 ml    I/O since admission:  -800  Filed Weights   05/22/16 0641 05/23/16 0408  Weight: 277 lb 8 oz (125.9 kg) 279 lb 8 oz (126.8 kg)    Telemetry    Sinus rhythm in the 70s - Personally Reviewed  ECG    ECG (independently read by me): NSR 74; 1st degree AV block; improved STT changes; IRBBB; inc QTc  Physical Exam    BP 100/62 (BP Location: Right Arm)   Pulse 71   Temp 98.3 F (36.8 C) (Oral)   Resp 18   Ht 5' 9"  (1.753 m)   Wt 279 lb 8 oz (126.8 kg)   SpO2 96%   BMI 41.27 kg/m    General: Alert, oriented, no distress.  Skin: normal turgor, no rashes, warm and dry HEENT: Normocephalic, atraumatic. Pupils equal round and reactive to light; sclera anicteric; extraocular muscles intact;  Nose without nasal septal hypertrophy Mouth/Parynx benign; Mallinpatti scale 3 Neck: No JVD, no carotid bruits; normal carotid upstroke Lungs: clear to ausculatation and percussion; no wheezing or rales Chest wall: without tenderness to palpitation Heart: PMI not displaced,  RRR, s1 s2 normal, 1/6 systolic murmur, no diastolic murmur, no rubs, gallops, thrills, or heaves Abdomen: abdominal obesity; soft, nontender; no hepatosplenomehaly, BS+; abdominal aorta nontender and not dilated by palpation. Back: no CVA tenderness Pulses 2+ Musculoskeletal: full range of motion, normal strength, no joint deformities Extremities: trace - 1+ LE edema; no clubbing cyanosis, Homan's sign negative  Neurologic: grossly nonfocal; Cranial nerves grossly wnl Psychologic: Normal mood and affect     Labs    Chemistry Recent Labs Lab 05/22/16 0713 05/23/16 0259  NA 133* 137  K 3.8 4.2  CL 97* 102  CO2 25 28  GLUCOSE 138* 95  BUN 28* 25*  CREATININE 2.06* 1.62*  CALCIUM 8.9 9.0  PROT 6.7  --   ALBUMIN 2.9*  --   AST 36  --   ALT 29  --   ALKPHOS 74  --   BILITOT 4.1*  --   GFRNONAA 32* 43*  GFRAA 37* 49*  ANIONGAP 11 7     Hematology Recent Labs Lab 05/22/16 0713 05/22/16 0803 05/22/16 1810 05/23/16 0259  WBC 7.6  --  7.2 6.2  RBC 2.51* 2.26* 2.97* 2.86*  HGB 6.3*  --  7.5* 7.3*  HCT 20.1*  --  24.0* 23.2*  MCV 80.1  --  80.8 81.1  MCH 25.1*  --  25.3* 25.5*  MCHC 31.3  --  31.3 31.5  RDW 16.3*  --  15.3 15.5  PLT 152  --  137* 115*    Cardiac Enzymes Recent Labs Lab 05/22/16 1434  TROPONINI 0.11*    Recent Labs Lab 05/22/16 0819  TROPIPOC 0.07     BNP Recent Labs Lab 05/22/16 0713  BNP 765.6*     DDimer No results for input(s): DDIMER in the last 168 hours.   Lipid Panel     Component Value Date/Time   CHOL 204 (H) 04/01/2016 0952   CHOL 135 11/29/2013 0940   TRIG 119.0 04/01/2016 0952   TRIG 177 (H) 11/29/2013 0940   HDL 29.70 (L) 04/01/2016 0952   HDL 34 (L) 11/29/2013 0940   CHOLHDL 7 04/01/2016 0952   VLDL 23.8 04/01/2016 0952   LDLCALC 151 (H) 04/01/2016 0952   LDLCALC 66 11/29/2013 0940    Radiology    Dg Chest 2 View  Result Date: 05/22/2016 CLINICAL DATA:  Shortness of breath.  Lower extremity swelling.  EXAM: CHEST  2 VIEW COMPARISON:  Two-view chest x-ray 04/27/2006 FINDINGS: Heart is enlarged. A diffuse interstitial pattern is now present, suggesting edema. There are no significant effusions. Mild bibasilar atelectasis is present. No significant airspace consolidation is present. The visualized soft tissues and bony thorax are unremarkable. IMPRESSION: 1. Cardiomegaly and mild edema compatible with congestive heart failure. 2. Mild bibasilar airspace disease likely reflects atelectasis. Electronically Signed   By: San Morelle M.D.   On: 05/22/2016 09:04   Dg Chest Port 1 View  Result Date: 05/23/2016 CLINICAL DATA:  Pulmonary edema. EXAM: PORTABLE CHEST 1 VIEW COMPARISON:  Radiographs of May 22, 2016. FINDINGS: Stable cardiomegaly. Status post coronary artery bypass graft. Stable bilateral perihilar and basilar opacities are noted concerning for pulmonary edema. No pneumothorax or pleural effusion is noted. Bony thorax is unremarkable. IMPRESSION: Stable bilateral pulmonary edema. Electronically Signed   By: Marijo Conception, M.D.   On: 05/23/2016 07:22    Cardiac Studies   ------------------------------------------------------------------- 05/19/2016  ECHO Study Conclusions  - Left ventricle: The cavity size was normal. Wall thickness was   increased in a pattern of mild LVH. Systolic function was normal.   The estimated ejection fraction was in the range of 60% to 65%.   Wall motion was normal; there were no regional wall motion   abnormalities. Doppler parameters are consistent with   pseudonormal left ventricular relaxation (grade 2 diastolic   dysfunction). The E/e&' ratio is >15, suggesting elevated LV   filling pressure. - Aortic valve: Mildly calcified leaflets. Mild stenosis. Mean   gradient (S): 23 mm Hg. Peak gradient (S): 36 mm Hg. Valve area   (VTI): 1.77 cm^2. Valve area (Vmax): 1.82 cm^2. Valve area   (Vmean): 1.68 cm^2. - Aorta: Dilated aortic root and ascending  aorta. Aortic root   dimension: 41 mm (ED). Ascending aortic diameter: 42 mm (S). - Mitral valve: Mildly thickened leaflets . There was trivial   regurgitation. - Left atrium: The atrium was mildly dilated. - Right atrium: The atrium was mildly dilated. - Tricuspid valve: There was mild regurgitation. - Pulmonary arteries: PA peak pressure: 28 mm Hg (S). - Inferior vena cava: The vessel was normal in size. The   respirophasic  diameter changes were in the normal range (>= 50%),   consistent with normal central venous pressure.  Impressions:  - LVEF 60-65%, mild LVH, normal wall motion, grade 2 DD with   elevated LV filling pressure, mild aortic stenosis - AVA around   1.8 cm2 based on LVOT diameter of 2.2 cm, dilated aortic root to   4.1 cm and ascending aorta to 4.2 cm, trivial MR, mild biatrial   enlargement, mild TR, RVSP 28 mmHg, normal IVC.  Patient Profile     66 y.o. male  with a past medical history significant for coronary artery disease s/p CABG 2008  (LIMA to the LAD, SVG to the RCA, SVG to the circumflex ), PAD, AAA, diabetes mellitus type 2 on metformin monotherapy, hyperlipidemia intolerant to statins, probable fatty liver, hypertension admitted with lower extremity edema, orthopnea, 16 pound weight gain and abnormal ECG in the setting of severe anemia.    Assessment & Plan    1. Acute blood loss anemia; s/p 2 units PRBC to receive a 3 rd unit today;  Would give furosemide prior to next  transfusion;  Suspect UGI source with recent mid epigastric bloated and recent colonoscpy.  2. Acute on chronic diastolic heart failure. Will give lasix with transfusion.  Mild troponin increase is probably due to demand ischemia  3 CAD; s/p CABG 03/2006; suspect anemia related ischemia; currently no chest pain and ECG improved today.  4. Essential HTN  5. DM   6. Probable fatty liver  7. Hyperlipidemia with inability to take numerous statins, with LFT elevation  under  evaluation for Repatha approval  8. Mild AS on recent echo  9. Mild AAA  10. Mild LE edema  11. Stage 3 CKD  Avoid ACE-I/ARB for now;   Cr was 0.98 11/2015 and 1.22 03/2016   Signed, Troy Sine, MD, [facility] 05/23/2016, 11:46 AM

## 2016-05-24 ENCOUNTER — Inpatient Hospital Stay (HOSPITAL_COMMUNITY): Payer: 59

## 2016-05-24 DIAGNOSIS — I350 Nonrheumatic aortic (valve) stenosis: Secondary | ICD-10-CM

## 2016-05-24 DIAGNOSIS — I5033 Acute on chronic diastolic (congestive) heart failure: Secondary | ICD-10-CM

## 2016-05-24 LAB — COMPREHENSIVE METABOLIC PANEL
ALK PHOS: 65 U/L (ref 38–126)
ALT: 25 U/L (ref 17–63)
AST: 31 U/L (ref 15–41)
Albumin: 2.9 g/dL — ABNORMAL LOW (ref 3.5–5.0)
Anion gap: 10 (ref 5–15)
BUN: 18 mg/dL (ref 6–20)
CALCIUM: 8.6 mg/dL — AB (ref 8.9–10.3)
CHLORIDE: 103 mmol/L (ref 101–111)
CO2: 24 mmol/L (ref 22–32)
CREATININE: 1.33 mg/dL — AB (ref 0.61–1.24)
GFR calc non Af Amer: 54 mL/min — ABNORMAL LOW (ref >60)
Glucose, Bld: 93 mg/dL (ref 65–99)
Potassium: 3.5 mmol/L (ref 3.5–5.1)
SODIUM: 137 mmol/L (ref 135–145)
Total Bilirubin: 7.5 mg/dL — ABNORMAL HIGH (ref 0.3–1.2)
Total Protein: 6.7 g/dL (ref 6.5–8.1)

## 2016-05-24 LAB — CBC
HCT: 25.1 % — ABNORMAL LOW (ref 39.0–52.0)
HEMOGLOBIN: 8 g/dL — AB (ref 13.0–17.0)
MCH: 25.6 pg — AB (ref 26.0–34.0)
MCHC: 31.9 g/dL (ref 30.0–36.0)
MCV: 80.4 fL (ref 78.0–100.0)
PLATELETS: 95 10*3/uL — AB (ref 150–400)
RBC: 3.12 MIL/uL — AB (ref 4.22–5.81)
RDW: 16 % — ABNORMAL HIGH (ref 11.5–15.5)
WBC: 6.3 10*3/uL (ref 4.0–10.5)

## 2016-05-24 LAB — GLUCOSE, CAPILLARY
GLUCOSE-CAPILLARY: 99 mg/dL (ref 65–99)
GLUCOSE-CAPILLARY: 108 mg/dL — AB (ref 65–99)
Glucose-Capillary: 107 mg/dL — ABNORMAL HIGH (ref 65–99)

## 2016-05-24 LAB — APTT: aPTT: 41 seconds — ABNORMAL HIGH (ref 24–36)

## 2016-05-24 NOTE — Progress Notes (Signed)
William [name] is a 66 y.o. male patient. 1. Anemia, unspecified type   2. Pulmonary edema   3. Gastrointestinal hemorrhage, unspecified gastrointestinal hemorrhage type   4. Acute pulmonary edema (HCC)   5. Renal insufficiency   6. Essential hypertension   7. Acute renal failure superimposed on stage 2 chronic kidney disease, unspecified acute renal failure type ([place])   8. Controlled type 2 diabetes mellitus with other circulatory complication, without long-term current use of insulin ([place])   9. CAD in native artery   10. Mixed hyperlipidemia   11. Pain    Past Medical History:  Diagnosis Date  . AAA (abdominal aortic aneurysm) ([place]) 03/2014   3.2 cm on MR abd.  F/u aortic u/s 06/2014 showed 3.0 x 3.1 cm AAA: recheck 2 yrs recommended (followed by cardiologist)  . Bilateral renal cysts    simple (03/2014 MRI)  . CAD (coronary artery disease)   . Diabetes mellitus with complication ([place]) 18/8416   A1c 6.8%  . Hyperlipemia, mixed   . Hypertension    Cr bump 04/01/16 so I changed benicar-hct to benicar plain and added amlodipine 5 mg.  . Microscopic hematuria    Eval unremarkable by Dr. Eulogio Ditch.  [name] NASH (nonalcoholic steatohepatitis)    Fatty liver on MR abd 03/2014, + hx of elevated transaminases and bili: followed by Dr. [name].  . Obesity    Current Facility-Administered Medications  Medication Dose Route Frequency Provider Last Rate Last Dose  . acetaminophen (TYLENOL) tablet 650 mg  650 mg Oral Q6H PRN Elwin Mocha, MD       Or  . acetaminophen (TYLENOL) suppository 650 mg  650 mg Rectal Q6H PRN Elwin Mocha, MD      . hydrALAZINE (APRESOLINE) injection 10 mg  10 mg Intravenous Q8H PRN Elwin Mocha, MD      . insulin aspart (novoLOG) injection 0-5 Units  0-5 Units Subcutaneous QHS Cherene Altes, MD      . insulin aspart (novoLOG) injection 0-9 Units  0-9 Units Subcutaneous TID WC Cherene Altes, MD      . ondansetron [facility]) tablet 4 mg  4 mg Oral Q6H PRN  Elwin Mocha, MD       Or  . ondansetron [facility]) injection 4 mg  4 mg Intravenous Q6H PRN Elwin Mocha, MD      . sodium chloride flush (NS) 0.9 % injection 3 mL  3 mL Intravenous Q12H Elwin Mocha, MD   3 mL at 05/23/16 2202   Allergies  Allergen Reactions  . Statins Other (See Comments)    Liver enzymes go up whenever on them   Principal Problem:   GI bleed Active Problems:   Essential hypertension   Pulmonary edema   Acute on chronic renal failure (HCC)   DM II (diabetes mellitus, type II), controlled (HCC)  Blood pressure (!) 100/50, pulse 81, temperature 98.4 F (36.9 C), temperature source Oral, resp. rate 14, height 5' 9"  (1.753 m), weight 126.8 kg (279 lb 8 oz), SpO2 97 %.   TEAM 1 - Stepdown/ICU TEAM  William [name]  SAY:[id number] DOB: 22-Apr-1950 DOA: 05/22/2016 PCP: Tammi Sou, MD    Brief Narrative:  66 yo male w/ history of AAA, diabetes, HTN, microscopic hematuria, and CAD who presented to the ED w/ dyspnea on exertion worsening over 7 days.  The day of admission patient could not even tie his shoes without becoming dyspneic. Had a recent stress test done  which was negative for inducible ischemia.  Had recent increase of torsemide up to 60 mg daily per wife (who is an Therapist, sports).  In the ED his Hgb found to be acutely low at 6.3 w/ guaiac + stool. Chest x-ray showed pulmonary edema. Blood work showed acute kidney injury.  Has received 3 units of blood thus far. He is guaic positive.  Subjective: Doing great, no significant SOB or CP.   Affirms: mild midepigastric discomfort in a band like pattern around diaphragm area.  Some mild lower extremity edema  Denies: f, c, n, v, diarrhea, rash, HA, weakness, DOE  Assessment & Plan:  ?Upper GI bleeding?    -for UGI evaluation in the AM of Weds    -otherwise, will empiracally place on PPI bid (IV for now)  Acute blood loss anemia - Fe deficiency     -as above, stable, above 7.0 for now  Acute on  chronic diastolic CHF - Acute pulmonary edema    -stable, no changes    -add compression stockings (if he can tolerate)  Acute kidney injury Appears pre-renal - improving w/ volume expansion - avoid contrast/ACEi/ARB  CAD s/p CABG 2008 Cardiology is following - no evidence of ACS presently   HTN BP well controlled in setting of blood loss  DM CBG currently well controlled   Morbid obesity - Body mass index is 41.27 kg/m.   DVT prophylaxis: SCDs Code Status: FULL CODE Family Communication: Spoke with family at bedside Disposition Plan: SDU  Consultants:  GI Cardiology  Objective: Blood pressure (!) 100/50, pulse 81, temperature 98.4 F (36.9 C), temperature source Oral, resp. rate 14, height 5' 9"  (1.753 m), weight 126.8 kg (279 lb 8 oz), SpO2 97 %.  Intake/Output Summary (Last 24 hours) at 05/24/16 1240 Last data filed at 05/24/16 0500  Gross per 24 hour  Intake              927 ml  Output              750 ml  Net              177 ml   Filed Weights   05/22/16 0641 05/23/16 0408 05/24/16 0435  Weight: 125.9 kg (277 lb 8 oz) 126.8 kg (279 lb 8 oz) 126.8 kg (279 lb 8 oz)    Examination: General: No acute respiratory distress, WDWN Lungs: Clear to auscultation bilaterally without wheezes or crackles Cardiovascular: Regular rate and rhythm with 2/6 holosystolic M  Abdomen: Nontender, nondistended, soft, bowel sounds positive, no rebound, no ascites, no appreciable mass Extremities: 2+ B LE edema   CBC:  Recent Labs Lab 05/22/16 0713 05/22/16 1810 05/23/16 0259 05/23/16 1709 05/24/16 0200  WBC 7.6 7.2 6.2 6.5 6.3  NEUTROABS 3.5 3.5  --   --   --   HGB 6.3* 7.5* 7.3* 8.1* 8.0*  HCT 20.1* 24.0* 23.2* 25.4* 25.1*  MCV 80.1 80.8 81.1 80.1 80.4  PLT 152 137* 115* 106* 95*   Basic Metabolic Panel:  Recent Labs Lab 05/22/16 0713 05/22/16 1434 05/23/16 0259 05/24/16 0200  NA 133*  --  137 137  K 3.8  --  4.2 3.5  CL 97*  --  102 103  CO2 25  --  28 24   GLUCOSE 138*  --  95 93  BUN 28*  --  25* 18  CREATININE 2.06*  --  1.62* 1.33*  CALCIUM 8.9  --  9.0 8.6*  MG  --  2.3  --   --  PHOS  --  4.3  --   --    GFR: Estimated Creatinine Clearance: 71.9 mL/min (A) (by C-G formula based on SCr of 1.33 mg/dL (H)).  Liver Function Tests:  Recent Labs Lab 05/22/16 0713 05/23/16 1814 05/24/16 0200  AST 36 40 31  ALT 29 28 25   ALKPHOS 74 71 65  BILITOT 4.1* 7.6* 7.5*  PROT 6.7 7.0 6.7  ALBUMIN 2.9* 3.0* 2.9*    Coagulation Profile:  Recent Labs Lab 05/22/16 0653 05/23/16 0259 05/23/16 1709  INR 1.74 1.77 1.78    Cardiac Enzymes:  Recent Labs Lab 05/22/16 1434  TROPONINI 0.11*    HbA1C: Hgb A1c MFr Bld  Date/Time Value Ref Range Status  04/01/2016 09:52 AM 6.7 (H) 4.6 - 6.5 % Final    Comment:    Glycemic Control Guidelines for People with Diabetes:Non Diabetic:  <6%Goal of Therapy: <7%Additional Action Suggested:  >8%   12/02/2015 10:20 AM 6.6 (H) 4.6 - 6.5 % Final    Comment:    Glycemic Control Guidelines for People with Diabetes:Non Diabetic:  <6%Goal of Therapy: <7%Additional Action Suggested:  >8%     CBG:  Recent Labs Lab 05/23/16 0848 05/23/16 1317 05/23/16 1713 05/23/16 1955 05/24/16 0804  GLUCAP 98 81 105* 107* 99    Recent Results (from the past 240 hour(s))  MRSA PCR Screening     Status: None   Collection Time: 05/22/16  1:26 PM  Result Value Ref Range Status   MRSA by PCR NEGATIVE NEGATIVE Final    Comment:        The GeneXpert MRSA Assay (FDA approved for NASAL specimens only), is one component of a comprehensive MRSA colonization surveillance program. It is not intended to diagnose MRSA infection nor to guide or monitor treatment for MRSA infections.      Scheduled Meds: . insulin aspart  0-5 Units Subcutaneous QHS  . insulin aspart  0-9 Units Subcutaneous TID WC  . sodium chloride flush  3 mL Intravenous Q12H     LOS: 2 days   Subjective Objective Assessment &  Plan  Tressie Ellis [facility] 05/24/2016

## 2016-05-24 NOTE — Progress Notes (Signed)
Progress Note  Patient Name: William [name] Date of Encounter: 05/24/2016  Primary Cardiologist: Claiborne Billings  Subjective   Feels better;  no CP/SOB s/p 3 units PRBC Tx. Breathing better  Inpatient Medications    Scheduled Meds: . insulin aspart  0-5 Units Subcutaneous QHS  . insulin aspart  0-9 Units Subcutaneous TID WC  . sodium chloride flush  3 mL Intravenous Q12H   Continuous Infusions:  PRN Meds: acetaminophen **OR** acetaminophen, hydrALAZINE, ondansetron **OR** ondansetron (ZOFRAN) IV   Vital Signs    Vitals:   05/23/16 1900 05/24/16 0055 05/24/16 0435 05/24/16 0805  BP: (!) 107/58 (!) 104/47 (!) 109/53 (!) 82/42  Pulse: 81 80 85 82  Resp: (!) 23 15 18 20   Temp: 98 F (36.7 C) 98.4 F (36.9 C) 98.1 F (36.7 C) 98.4 F (36.9 C)  TempSrc: Oral Oral Oral Oral  SpO2: 95% 97% 95% 96%  Weight:   279 lb 8 oz (126.8 kg)   Height:        Intake/Output Summary (Last 24 hours) at 05/24/16 1011 Last data filed at 05/24/16 0500  Gross per 24 hour  Intake             1047 ml  Output              750 ml  Net              297 ml    I/O since admission:  -583  Filed Weights   05/22/16 0641 05/23/16 0408 05/24/16 0435  Weight: 277 lb 8 oz (125.9 kg) 279 lb 8 oz (126.8 kg) 279 lb 8 oz (126.8 kg)    Telemetry    Sinus rhythm in the 70s - Personally Reviewed  ECG    Will re-check ECG today  05/23/16 ECG (independently read by me): NSR 74; 1st degree AV block; improved STT changes; IRBBB; inc QTc  Physical Exam    BP (!) 82/42 (BP Location: Right Arm)   Pulse 82   Temp 98.4 F (36.9 C) (Oral)   Resp 20   Ht 5' 9"  (1.753 m)   Wt 279 lb 8 oz (126.8 kg)   SpO2 96%   BMI 41.27 kg/m    General: Alert, oriented, no distress.  Skin: normal turgor, no rashes, warm and dry HEENT: Normocephalic, atraumatic. Pupils equal round and reactive to light; sclera anicteric; extraocular muscles intact;  Nose without nasal septal hypertrophy Mouth/Parynx benign;  Mallinpatti scale 3 Neck: No JVD, no carotid bruits; normal carotid upstroke Lungs: clear to ausculatation and percussion; no wheezing or rales Chest wall: without tenderness to palpitation Heart: PMI not displaced, RRR, s1 s2 normal, 2/6 systolic murmur aortic area, no diastolic murmur, no rubs, gallops, thrills, or heaves Abdomen: abdominal obesity; soft, nontender; no hepatosplenomehaly, BS+; abdominal aorta nontender and not dilated by palpation. Back: no CVA tenderness Pulses 2+ Musculoskeletal: full range of motion, normal strength, no joint deformities Extremities:  1+ LE edema; no clubbing cyanosis, Homan's sign negative  Neurologic: grossly nonfocal; Cranial nerves grossly wnl Psychologic: Normal mood and affect   Labs    Chemistry  Recent Labs Lab 05/22/16 0713 05/23/16 0259 05/23/16 1814 05/24/16 0200  NA 133* 137  --  137  K 3.8 4.2  --  3.5  CL 97* 102  --  103  CO2 25 28  --  24  GLUCOSE 138* 95  --  93  BUN 28* 25*  --  18  CREATININE 2.06* 1.62*  --  1.33*  CALCIUM 8.9 9.0  --  8.6*  PROT 6.7  --  7.0 6.7  ALBUMIN 2.9*  --  3.0* 2.9*  AST 36  --  40 31  ALT 29  --  28 25  ALKPHOS 74  --  71 65  BILITOT 4.1*  --  7.6* 7.5*  GFRNONAA 32* 43*  --  54*  GFRAA 37* 49*  --  >60  ANIONGAP 11 7  --  10     Hematology  Recent Labs Lab 05/23/16 0259 05/23/16 1709 05/24/16 0200  WBC 6.2 6.5 6.3  RBC 2.86* 3.17* 3.12*  HGB 7.3* 8.1* 8.0*  HCT 23.2* 25.4* 25.1*  MCV 81.1 80.1 80.4  MCH 25.5* 25.6* 25.6*  MCHC 31.5 31.9 31.9  RDW 15.5 15.6* 16.0*  PLT 115* 106* 95*    Cardiac Enzymes  Recent Labs Lab 05/22/16 1434  TROPONINI 0.11*     Recent Labs Lab 05/22/16 0819  TROPIPOC 0.07     BNP  Recent Labs Lab 05/22/16 0713  BNP 765.6*     DDimer No results for input(s): DDIMER in the last 168 hours.   Lipid Panel     Component Value Date/Time   CHOL 204 (H) 04/01/2016 0952   CHOL 135 11/29/2013 0940   TRIG 119.0 04/01/2016 0952    TRIG 177 (H) 11/29/2013 0940   HDL 29.70 (L) 04/01/2016 0952   HDL 34 (L) 11/29/2013 0940   CHOLHDL 7 04/01/2016 0952   VLDL 23.8 04/01/2016 0952   LDLCALC 151 (H) 04/01/2016 0952   LDLCALC 66 11/29/2013 0940    Radiology    Dg Chest Port 1 View  Result Date: 05/23/2016 CLINICAL DATA:  Pulmonary edema. EXAM: PORTABLE CHEST 1 VIEW COMPARISON:  Radiographs of May 22, 2016. FINDINGS: Stable cardiomegaly. Status post coronary artery bypass graft. Stable bilateral perihilar and basilar opacities are noted concerning for pulmonary edema. No pneumothorax or pleural effusion is noted. Bony thorax is unremarkable. IMPRESSION: Stable bilateral pulmonary edema. Electronically Signed   By: Marijo Conception, M.D.   On: 05/23/2016 07:22    Cardiac Studies   ------------------------------------------------------------------- 05/19/2016  ECHO Study Conclusions  - Left ventricle: The cavity size was normal. Wall thickness was   increased in a pattern of mild LVH. Systolic function was normal.   The estimated ejection fraction was in the range of 60% to 65%.   Wall motion was normal; there were no regional wall motion   abnormalities. Doppler parameters are consistent with   pseudonormal left ventricular relaxation (grade 2 diastolic   dysfunction). The E/e&' ratio is >15, suggesting elevated LV   filling pressure. - Aortic valve: Mildly calcified leaflets. Mild stenosis. Mean   gradient (S): 23 mm Hg. Peak gradient (S): 36 mm Hg. Valve area   (VTI): 1.77 cm^2. Valve area (Vmax): 1.82 cm^2. Valve area   (Vmean): 1.68 cm^2. - Aorta: Dilated aortic root and ascending aorta. Aortic root   dimension: 41 mm (ED). Ascending aortic diameter: 42 mm (S). - Mitral valve: Mildly thickened leaflets . There was trivial   regurgitation. - Left atrium: The atrium was mildly dilated. - Right atrium: The atrium was mildly dilated. - Tricuspid valve: There was mild regurgitation. - Pulmonary arteries: PA peak  pressure: 28 mm Hg (S). - Inferior vena cava: The vessel was normal in size. The   respirophasic diameter changes were in the normal range (>= 50%),   consistent with normal central venous pressure.  Impressions:  - LVEF  60-65%, mild LVH, normal wall motion, grade 2 DD with   elevated LV filling pressure, mild aortic stenosis - AVA around   1.8 cm2 based on LVOT diameter of 2.2 cm, dilated aortic root to   4.1 cm and ascending aorta to 4.2 cm, trivial MR, mild biatrial   enlargement, mild TR, RVSP 28 mmHg, normal IVC.  Patient Profile     66 y.o. male  with a past medical history significant for coronary artery disease s/p CABG 2008  (LIMA to the LAD, SVG to the RCA, SVG to the circumflex ), PAD, AAA, diabetes mellitus type 2 on metformin monotherapy, hyperlipidemia intolerant to statins, probable fatty liver, hypertension admitted with lower extremity edema, orthopnea, 16 pound weight gain and abnormal ECG in the setting of severe anemia.    Assessment & Plan    1. Acute blood loss/iron deficiency anemia; s/p 3 units PRBC. Guiac positive, Ferritin 8  Fe sat 4%.   No significant increase in Hb despite additional unit transfusion yesterday.   Received  furosemide prior to 3rd  transfusion;  Suspect UGI source with recent mid epigastric bloated.  H/o colonic polyps. Plan is for EGD tomorrow. Consider adding iron supplementation.  2. Acute on chronic diastolic heart failure.  Mild troponin increase is probably due to demand ischemia  3 CAD; s/p CABG 03/2006; suspect anemia related ischemia; currently no chest pain and ECG improved today.  4. Essential HTN; BP low earlier this am; now 297 systolically.  5. DM   6. Probable fatty liver  7. Hyperlipidemia with inability to take numerous statins, with LFT elevation  under evaluation for Repatha approval  8. Mild AS on recent echo with mean gradient 23, peak 36 mm Hg; AVA ~1.8 cm2  9. Mild AAA  10. Mild LE edema  11. Stage 3 CKD   Avoid ACE-I/ARB for now;   Cr was 0.98 11/2015 and 1.22 03/2016.  Cr today 1.33.  OK for endoscospy; current guidelines do not require SBE prophylaxis.   Signed, Troy Sine, MD, [facility] 05/24/2016, 10:11 AM

## 2016-05-24 NOTE — Care Management Note (Signed)
Case Management Note  Patient Details  Name: William [name] MRN: [id number] Date of Birth: 1950/10/25  Subjective/Objective:    Pt admitted with severe blood loss            Action/Plan:   PTA from home with wife - [facility] following.  CM following for discharge needs   Expected Discharge Date:                  Expected Discharge Plan:  Home/Self Care  In-House Referral:     Discharge planning Services  CM Consult  Post Acute Care Choice:    Choice offered to:     DME Arranged:    DME Agency:     HH Arranged:    HH Agency:     Status of Service:  In process, will continue to follow  If discussed at Long Length of Stay Meetings, dates discussed:    Additional Comments:  Maryclare Labrador, RN 05/24/2016, 10:30 AM

## 2016-05-24 NOTE — Progress Notes (Signed)
Subjective: No complaints.  Feeling well.  Objective: Vital signs in last 24 hours: Temp:  [98 F (36.7 C)-98.8 F (37.1 C)] 98.2 F (36.8 C) (04/17 1244) Pulse Rate:  [74-91] 85 (04/17 1244) Resp:  [14-24] 22 (04/17 1244) BP: (82-155)/(42-60) 104/52 (04/17 1244) SpO2:  [91 %-100 %] 97 % (04/17 1244) Weight:  [126.8 kg (279 lb 8 oz)] 126.8 kg (279 lb 8 oz) (04/17 0435) Last BM Date: 05/23/16  Intake/Output from previous day: 04/16 0701 - 04/17 0700 In: 1047 [P.O.:600; Blood:447] Out: 1150 [Urine:1150] Intake/Output this shift: No intake/output data recorded.  General appearance: alert and no distress Resp: clear to auscultation bilaterally Cardio: regular rate and rhythm GI: soft, non-tender; bowel sounds normal; no masses,  no organomegaly Extremities: extremities normal, atraumatic, no cyanosis or edema  Lab Results:  Recent Labs  05/23/16 0259 05/23/16 1709 05/24/16 0200  WBC 6.2 6.5 6.3  HGB 7.3* 8.1* 8.0*  HCT 23.2* 25.4* 25.1*  PLT 115* 106* 95*   BMET  Recent Labs  05/22/16 0713 05/23/16 0259 05/24/16 0200  NA 133* 137 137  K 3.8 4.2 3.5  CL 97* 102 103  CO2 25 28 24   GLUCOSE 138* 95 93  BUN 28* 25* 18  CREATININE 2.06* 1.62* 1.33*  CALCIUM 8.9 9.0 8.6*   LFT  Recent Labs  05/23/16 1814 05/24/16 0200  PROT 7.0 6.7  ALBUMIN 3.0* 2.9*  AST 40 31  ALT 28 25  ALKPHOS 71 65  BILITOT 7.6* 7.5*  BILIDIR 0.9*  --   IBILI 6.7*  --    PT/INR  Recent Labs  05/23/16 0259 05/23/16 1709  LABPROT 20.8* 20.9*  INR 1.77 1.78   Hepatitis Panel No results for input(s): HEPBSAG, HCVAB, HEPAIGM, HEPBIGM in the last 72 hours. C-Diff No results for input(s): CDIFFTOX in the last 72 hours. Fecal Lactopherrin No results for input(s): FECLLACTOFRN in the last 72 hours.  Studies/Results: Dg Chest Port 1 View  Result Date: 05/23/2016 CLINICAL DATA:  Pulmonary edema. EXAM: PORTABLE CHEST 1 VIEW COMPARISON:  Radiographs of May 22, 2016. FINDINGS:  Stable cardiomegaly. Status post coronary artery bypass graft. Stable bilateral perihilar and basilar opacities are noted concerning for pulmonary edema. No pneumothorax or pleural effusion is noted. Bony thorax is unremarkable. IMPRESSION: Stable bilateral pulmonary edema. Electronically Signed   By: Marijo Conception, M.D.   On: 05/23/2016 07:22    Medications:  Scheduled: . insulin aspart  0-5 Units Subcutaneous QHS  . insulin aspart  0-9 Units Subcutaneous TID WC  . sodium chloride flush  3 mL Intravenous Q12H   Continuous:   Assessment/Plan: 1) GI bleed. 2) Diastolic CHF. 3) Fatty liver.   The patient is hemodynamically stable.  No overt evidence of any GI bleed.  He feels well at this time.  Cardiology has cleared him for the EGD.  Unable to put him on the schedule today for the EGD as there was no available time slots.    Plan: 1) EGD tomorrow with Dr. [name]. 2) NPO after midnight. 3) Clear liquid diet today.  LOS: 2 days   William [name] D 05/24/2016, 1:48 PM

## 2016-05-24 NOTE — Plan of Care (Signed)
Problem: Education: Goal: Knowledge of [facility] Education information/materials will improve Outcome: Progressing POC reviewed with pt.   

## 2016-05-25 ENCOUNTER — Encounter (HOSPITAL_COMMUNITY): Payer: Self-pay | Admitting: Certified Registered Nurse Anesthetist

## 2016-05-25 ENCOUNTER — Encounter (HOSPITAL_COMMUNITY)
Admission: EM | Disposition: A | Discharge status: Home / Self Care | Payer: Self-pay | Source: Home / Self Care | Attending: Internal Medicine

## 2016-05-25 ENCOUNTER — Inpatient Hospital Stay (HOSPITAL_COMMUNITY): Payer: 59 | Admitting: Certified Registered Nurse Anesthetist

## 2016-05-25 DIAGNOSIS — D509 Iron deficiency anemia, unspecified: Secondary | ICD-10-CM

## 2016-05-25 DIAGNOSIS — D649 Anemia, unspecified: Secondary | ICD-10-CM

## 2016-05-25 HISTORY — PX: ESOPHAGOGASTRODUODENOSCOPY: SHX5428

## 2016-05-25 LAB — CBC WITH DIFFERENTIAL/PLATELET
BASOS ABS: 0.1 10*3/uL (ref 0.0–0.1)
BASOS PCT: 1 %
EOS ABS: 0.4 10*3/uL (ref 0.0–0.7)
Eosinophils Relative: 7 %
HCT: 24.6 % — ABNORMAL LOW (ref 39.0–52.0)
HEMOGLOBIN: 7.6 g/dL — AB (ref 13.0–17.0)
Lymphocytes Relative: 21 %
Lymphs Abs: 1.2 10*3/uL (ref 0.7–4.0)
MCH: 25.1 pg — ABNORMAL LOW (ref 26.0–34.0)
MCHC: 30.9 g/dL (ref 30.0–36.0)
MCV: 81.2 fL (ref 78.0–100.0)
MONOS PCT: 19 %
Monocytes Absolute: 1.1 10*3/uL — ABNORMAL HIGH (ref 0.1–1.0)
NEUTROS PCT: 53 %
Neutro Abs: 3.2 10*3/uL (ref 1.7–7.7)
Platelets: 92 10*3/uL — ABNORMAL LOW (ref 150–400)
RBC: 3.03 MIL/uL — AB (ref 4.22–5.81)
RDW: 16.3 % — ABNORMAL HIGH (ref 11.5–15.5)
WBC: 6 10*3/uL (ref 4.0–10.5)

## 2016-05-25 LAB — COMPREHENSIVE METABOLIC PANEL
ALBUMIN: 2.8 g/dL — AB (ref 3.5–5.0)
ALK PHOS: 65 U/L (ref 38–126)
ALT: 23 U/L (ref 17–63)
ANION GAP: 9 (ref 5–15)
AST: 30 U/L (ref 15–41)
BUN: 10 mg/dL (ref 6–20)
CALCIUM: 8.5 mg/dL — AB (ref 8.9–10.3)
CO2: 25 mmol/L (ref 22–32)
Chloride: 102 mmol/L (ref 101–111)
Creatinine, Ser: 1.08 mg/dL (ref 0.61–1.24)
GFR calc Af Amer: >60 mL/min (ref >60)
GFR calc non Af Amer: >60 mL/min (ref >60)
GLUCOSE: 97 mg/dL (ref 65–99)
Potassium: 3.2 mmol/L — ABNORMAL LOW (ref 3.5–5.1)
SODIUM: 136 mmol/L (ref 135–145)
Total Bilirubin: 7.7 mg/dL — ABNORMAL HIGH (ref 0.3–1.2)
Total Protein: 6.2 g/dL — ABNORMAL LOW (ref 6.5–8.1)

## 2016-05-25 LAB — CBC
HCT: 27.1 % — ABNORMAL LOW (ref 39.0–52.0)
HEMOGLOBIN: 8.6 g/dL — AB (ref 13.0–17.0)
MCH: 26.1 pg (ref 26.0–34.0)
MCHC: 31.7 g/dL (ref 30.0–36.0)
MCV: 82.4 fL (ref 78.0–100.0)
PLATELETS: 97 10*3/uL — AB (ref 150–400)
RBC: 3.29 MIL/uL — AB (ref 4.22–5.81)
RDW: 16.1 % — ABNORMAL HIGH (ref 11.5–15.5)
WBC: 5.9 10*3/uL (ref 4.0–10.5)

## 2016-05-25 LAB — MAGNESIUM: Magnesium: 2.1 mg/dL (ref 1.7–2.4)

## 2016-05-25 LAB — PREPARE RBC (CROSSMATCH)

## 2016-05-25 LAB — GLUCOSE, CAPILLARY
GLUCOSE-CAPILLARY: 100 mg/dL — AB (ref 65–99)
GLUCOSE-CAPILLARY: 95 mg/dL (ref 65–99)
Glucose-Capillary: 115 mg/dL — ABNORMAL HIGH (ref 65–99)
Glucose-Capillary: 113 mg/dL — ABNORMAL HIGH (ref 65–99)

## 2016-05-25 SURGERY — EGD (ESOPHAGOGASTRODUODENOSCOPY)
Anesthesia: Monitor Anesthesia Care

## 2016-05-25 MED ORDER — POLYSACCHARIDE IRON COMPLEX 150 MG PO CAPS
150.0000 mg | ORAL_CAPSULE | Freq: Every day | ORAL | Status: DC
  Administered 2016-05-26 – 2016-05-29 (×4): 150 mg via ORAL
  Filled 2016-05-25 (×4): qty 1

## 2016-05-25 MED ORDER — FUROSEMIDE 10 MG/ML IJ SOLN: 40.0000 mg | Freq: Once | INTRAMUSCULAR | Status: DC

## 2016-05-25 MED ORDER — PROPOFOL 500 MG/50ML IV EMUL
INTRAVENOUS | Status: DC | PRN
  Administered 2016-05-25: 150 ug/kg/min via INTRAVENOUS

## 2016-05-25 MED ORDER — PROPOFOL 10 MG/ML IV BOLUS
INTRAVENOUS | Status: DC | PRN
  Administered 2016-05-25: 20 mg via INTRAVENOUS
  Administered 2016-05-25: 30 mg via INTRAVENOUS

## 2016-05-25 MED ORDER — SODIUM CHLORIDE 0.9 % IV SOLN: INTRAVENOUS | Status: DC

## 2016-05-25 MED ORDER — SODIUM CHLORIDE 0.9 % IV SOLN
Freq: Once | INTRAVENOUS | Status: AC
  Administered 2016-05-27: 07:00:00 via INTRAVENOUS

## 2016-05-25 MED ORDER — LACTATED RINGERS IV SOLN
INTRAVENOUS | Status: DC
  Administered 2016-05-25: 14:00:00 via INTRAVENOUS

## 2016-05-25 MED ORDER — SODIUM CHLORIDE 0.9 % IV SOLN
25.0000 mg | Freq: Once | INTRAVENOUS | Status: AC
  Administered 2016-05-25: 25 mg via INTRAVENOUS
  Filled 2016-05-25: qty 0.5

## 2016-05-25 MED ORDER — SODIUM CHLORIDE 0.9 % IV SOLN
500.0000 mg | Freq: Every day | INTRAVENOUS | Status: AC
  Administered 2016-05-26: 500 mg via INTRAVENOUS
  Filled 2016-05-25 (×3): qty 10

## 2016-05-25 MED ORDER — SODIUM CHLORIDE 0.9 % IV SOLN
30.0000 meq | Freq: Once | INTRAVENOUS | Status: AC
  Administered 2016-05-25: 30 meq via INTRAVENOUS
  Filled 2016-05-25: qty 15

## 2016-05-25 MED ORDER — POTASSIUM CHLORIDE CRYS ER 20 MEQ PO TBCR: 40.0000 meq | EXTENDED_RELEASE_TABLET | Freq: Once | ORAL | Status: DC

## 2016-05-25 NOTE — Anesthesia Preprocedure Evaluation (Signed)
Anesthesia Evaluation  Patient identified by MRN, date of birth, ID band Patient awake    Reviewed: Allergy & Precautions, NPO status , Patient's Chart, lab work & pertinent test results  Airway Mallampati: III  TM Distance: >3 FB Neck ROM: Full    Dental no notable dental hx.    Pulmonary neg pulmonary ROS, former smoker,    Pulmonary exam normal breath sounds clear to auscultation       Cardiovascular hypertension, + CAD and + Peripheral Vascular Disease  negative cardio ROS Normal cardiovascular exam Rhythm:Regular Rate:Normal     Neuro/Psych negative neurological ROS  negative psych ROS   GI/Hepatic negative GI ROS, (+) Hepatitis -  Endo/Other  diabetes, Type 2Morbid obesity  Renal/GU Renal diseasenegative Renal ROS     Musculoskeletal negative musculoskeletal ROS (+)   Abdominal (+) + obese,   Peds  Hematology negative hematology ROS (+)   Anesthesia Other Findings   Reproductive/Obstetrics negative OB ROS                             Anesthesia Physical Anesthesia Plan  ASA: III  Anesthesia Plan: MAC   Post-op Pain Management:    Induction: Intravenous  Airway Management Planned:   Additional Equipment:   Intra-op Plan:   Post-operative Plan:   Informed Consent: I have reviewed the patients History and Physical, chart, labs and discussed the procedure including the risks, benefits and alternatives for the proposed anesthesia with the patient or authorized representative who has indicated his/her understanding and acceptance.   Dental advisory given  Plan Discussed with: CRNA  Anesthesia Plan Comments:         Anesthesia Quick Evaluation

## 2016-05-25 NOTE — H&P (View-Only) (Signed)
Subjective: Since I last evaluated the patient, he seems to be doing much better. He has been bloated but denies having any obvious melena or hematochezia. His pedal edema has improved with the diuretics. He denies having any nausea, vomiting or abdominal pain. Prior to admission he had some epigastric burning and bloating but that has since improved. He took some Prilosec 20 mg for the reflux. He received 2 units of PRBC''s yesterday.  Objective: Vital signs in last 24 hours: Temp:  [98 F (36.7 C)-98.3 F (36.8 C)] 98.3 F (36.8 C) (04/16 1514) Pulse Rate:  [66-76] 76 (04/16 1514) Resp:  [13-24] 19 (04/16 1514) BP: (81-155)/(33-63) 155/55 (04/16 1514) SpO2:  [93 %-100 %] 100 % (04/16 1514) Weight:  [126.8 kg (279 lb 8 oz)] 126.8 kg (279 lb 8 oz) (04/16 0408) Last BM Date: 05/21/16  Intake/Output from previous day: 04/15 0701 - 04/16 0700 In: 670 [Blood:670] Out: 1150 [Urine:1150] Intake/Output this shift: Total I/O In: 240 [P.O.:240] Out: 400 [Urine:400]  General appearance: alert, cooperative, appears stated age, fatigued, no distress, morbidly obese and pale Resp: clear to auscultation bilaterally Cardio: regular rate and rhythm, S1, S2 normal, 1/6 SEM, click, rub or gallop GI: soft, morbidly obesenon-tender; bowel sounds normal; no masses,  no organomegaly Extremities: extremities with 1+ pedal edema, atraumatic no cyanosis  Lab Results:  Recent Labs  05/22/16 0713 05/22/16 1810 05/23/16 0259  WBC 7.6 7.2 6.2  HGB 6.3* 7.5* 7.3*  HCT 20.1* 24.0* 23.2*  PLT 152 137* 115*   BMET  Recent Labs  05/22/16 0713 05/23/16 0259  NA 133* 137  K 3.8 4.2  CL 97* 102  CO2 25 28  GLUCOSE 138* 95  BUN 28* 25*  CREATININE 2.06* 1.62*  CALCIUM 8.9 9.0   LFT  Recent Labs  05/22/16 0713 05/22/16 1434  PROT 6.7  --   ALBUMIN 2.9*  --   AST 36  --   ALT 29  --   ALKPHOS 74  --   BILITOT 4.1*  --   BILIDIR  --  0.7*   PT/INR  Recent Labs  05/22/16 0653  05/23/16 0259  LABPROT 20.5* 20.8*  INR 1.74 1.77   Studies/Results: Dg Chest 2 View  Result Date: 05/22/2016 CLINICAL DATA:  Shortness of breath.  Lower extremity swelling. EXAM: CHEST  2 VIEW COMPARISON:  Two-view chest x-ray 04/27/2006 FINDINGS: Heart is enlarged. A diffuse interstitial pattern is now present, suggesting edema. There are no significant effusions. Mild bibasilar atelectasis is present. No significant airspace consolidation is present. The visualized soft tissues and bony thorax are unremarkable. IMPRESSION: 1. Cardiomegaly and mild edema compatible with congestive heart failure. 2. Mild bibasilar airspace disease likely reflects atelectasis. Electronically Signed   By: San Morelle M.D.   On: 05/22/2016 09:04   Dg Chest Port 1 View  Result Date: 05/23/2016 CLINICAL DATA:  Pulmonary edema. EXAM: PORTABLE CHEST 1 VIEW COMPARISON:  Radiographs of May 22, 2016. FINDINGS: Stable cardiomegaly. Status post coronary artery bypass graft. Stable bilateral perihilar and basilar opacities are noted concerning for pulmonary edema. No pneumothorax or pleural effusion is noted. Bony thorax is unremarkable. IMPRESSION: Stable bilateral pulmonary edema. Electronically Signed   By: Marijo Conception, M.D.   On: 05/23/2016 07:22   Medications: I have reviewed the patient's current medications.  Assessment/Plan: 1) Iron deficiency anemia with guaiac positive stools-will plan an EGD on Wednesday after we get clearance from the cardiology service.   2) Diverticulosis. 3) Morbid obesity/Abnormal LFT's-fatty liver.  Will check a liver panel tomorrow. He will benefit from an elastography of the liver to rule out cirrhosis. 4) Thrombocytopenia.  5) History of colonic polyps.  LOS: 1 day   Aja Whitehair 05/23/2016, 4:12 PM

## 2016-05-25 NOTE — Interval H&P Note (Signed)
History and Physical Interval Note:  05/25/2016 2:18 PM  William [name]  has presented today for surgery, with the diagnosis of GI bleed  The various methods of treatment have been discussed with the patient and family. After consideration of risks, benefits and other options for treatment, the patient has consented to  Procedure(s): ESOPHAGOGASTRODUODENOSCOPY (EGD) (N/A) as a surgical intervention .  The patient's history has been reviewed, patient examined, no change in status, stable for surgery.  I have reviewed the patient's chart and labs.  Questions were answered to the patient's satisfaction.  Patient seems to be more jaundiced today.   Triton Heidrich

## 2016-05-25 NOTE — Transfer of Care (Signed)
Immediate Anesthesia Transfer of Care Note  Patient: William [name]  Procedure(s) Performed: Procedure(s): ESOPHAGOGASTRODUODENOSCOPY (EGD) (N/A)  Patient Location: PACU and Endoscopy Unit  Anesthesia Type:MAC  Level of Consciousness: awake, patient cooperative and responds to stimulation  Airway & Oxygen Therapy: Patient Spontanous Breathing  Post-op Assessment: Report given to RN and Post -op Vital signs reviewed and stable  Post vital signs: Reviewed and stable  Last Vitals:  Vitals:   05/25/16 1245 05/25/16 1402  BP: 121/61 (!) 146/62  Pulse: 97   Resp: (!) 21 (!) 22  Temp: 36.8 C 36.9 C    Last Pain:  Vitals:   05/25/16 1402  TempSrc: Oral  PainSc:          Complications: No apparent anesthesia complications

## 2016-05-25 NOTE — Progress Notes (Signed)
[place] TEAM 1 - Stepdown/ICU TEAM  ELVER STADLER  VEH:[id number] DOB: [birth year] DOA: 05/22/2016 PCP: Tammi Sou, MD    Brief Narrative:  66 yo male w/ history of AAA, diabetes, HTN, microscopic hematuria, and CAD who presented to the ED w/ dyspnea on exertion worsening over 7 days.  The day of admission patient could not even tie his shoes without becoming dyspneic.   In the ED his Hgb was found to be acutely low at 6.3 w/ guaiac + stool. Chest x-ray showed pulmonary edema. Blood work showed acute kidney injury.   Subjective: The patient is resting comfortably in bed.  He denies chest pain shortness breath fevers chills nausea or vomiting.  He has not had a bowel movement since admission.  Assessment & Plan:  GIB - ?Upper  to have endoscopic evaluation today   Acute blood loss anemia - Fe deficiency  Goal Hgb is 8.0 in setting of CAD - transfuse as needed - s/p 2U PRBC thus far - transfuse another unit today to obtain goal of 8 or >  Recent Labs Lab 05/22/16 1810 05/23/16 0259 05/23/16 1709 05/24/16 0200 05/25/16 0358  HGB 7.5* 7.3* 8.1* 8.0* 7.6*    Acute on chronic diastolic CHF - Acute pulmonary edema well compensated at this time - follow for decompensation w/ blood transfusion - give lasix post transfusion as precaution    Acute kidney injury Corrected w/ volume expansion/transfusion - avoid contrast/ACEi/ARB  Recent Labs Lab 05/22/16 0713 05/23/16 0259 05/24/16 0200 05/25/16 0358  CREATININE 2.06* 1.62* 1.33* 1.08    Fatty liver  Pt has been informed of this in the past per his report - [place] this admit suggests 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   DVT prophylaxis: SCDs Code Status: FULL CODE Family Communication: Spoke with family at bedside Disposition Plan: SDU  Consultants:  GI Cardiology  Procedures: none  Antimicrobials:  none   Objective: Blood pressure (!) 105/52, pulse (!) 102, temperature 97.8 F (36.6 C), temperature source Oral, resp. rate 18, height 5' 9"  (1.753 m), weight 122.9 kg (270 lb 14.4 oz), SpO2 95 %.  Intake/Output Summary (Last 24 hours) at 05/25/16 1051 Last data filed at 05/25/16 0804  Gross per 24 hour  Intake              985 ml  Output             1950 ml  Net             -965 ml   Filed Weights   05/24/16 0435 05/24/16 1434 05/25/16 0523  Weight: 126.8 kg (279 lb 8 oz) 122.8 kg (270 lb 12.8 oz) 122.9 kg (270 lb 14.4 oz)    Examination: General: No acute respiratory distress - alert and pleasant  Lungs: Clear to auscultation bilaterally - no wheezing  Cardiovascular: RRR - 2/6 holosystolic M - no rub  Abdomen: Nontender, nondistended, obese, soft, bowel sounds positive, no rebound, no ascites, no appreciable mass Extremities: 1+ B LE edema persists   CBC:  Recent Labs Lab  05/22/16 0713 05/22/16 1810 05/23/16 0259 05/23/16 1709 05/24/16 0200 05/25/16 0358  WBC 7.6 7.2 6.2 6.5 6.3 6.0  NEUTROABS 3.5 3.5  --   --   --  3.2  HGB 6.3* 7.5* 7.3* 8.1* 8.0* 7.6*  HCT 20.1* 24.0* 23.2* 25.4* 25.1* 24.6*  MCV 80.1 80.8 81.1 80.1 80.4 81.2  PLT 152 137* 115* 106* 95* 92*   Basic Metabolic Panel:  Recent Labs Lab 05/22/16 0713 05/22/16 1434 05/23/16 0259 05/24/16 0200 05/25/16 0358  NA 133*  --  137 137 136  K 3.8  --  4.2 3.5 3.2*  CL 97*  --  102 103 102  CO2 25  --  28 24 25   GLUCOSE 138*  --  95 93 97  BUN 28*  --  25* 18 10  CREATININE 2.06*  --  1.62* 1.33* 1.08  CALCIUM 8.9  --  9.0 8.6* 8.5*  MG  --  2.3  --   --  2.1  PHOS  --  4.3  --   --   --    GFR: Estimated Creatinine  Clearance: 87.2 mL/min (by C-G formula based on SCr of 1.08 mg/dL).  Liver Function Tests:  Recent Labs Lab 05/22/16 0713 05/23/16 1814 05/24/16 0200 05/25/16 0358  AST 36 40 31 30  ALT 29 28 25 23   ALKPHOS 74 71 65 65  BILITOT 4.1* 7.6* 7.5* 7.7*  PROT 6.7 7.0 6.7 6.2*  ALBUMIN 2.9* 3.0* 2.9* 2.8*    Coagulation Profile:  Recent Labs Lab 05/22/16 0653 05/23/16 0259 05/23/16 1709  INR 1.74 1.77 1.78    Cardiac Enzymes:  Recent Labs Lab 05/22/16 1434  TROPONINI 0.11*    HbA1C: Hgb A1c MFr Bld  Date/Time Value Ref Range Status  04/01/2016 09:52 AM 6.7 (H) 4.6 - 6.5 % Final    Comment:    Glycemic Control Guidelines for People with Diabetes:Non Diabetic:  <6%Goal of Therapy: <7%Additional Action Suggested:  >8%   12/02/2015 10:20 AM 6.6 (H) 4.6 - 6.5 % Final    Comment:    Glycemic Control Guidelines for People with Diabetes:Non Diabetic:  <6%Goal of Therapy: <7%Additional Action Suggested:  >8%     CBG:  Recent Labs Lab 05/23/16 1955 05/24/16 0804 05/24/16 1243 05/24/16 2054 05/25/16 0803  GLUCAP 107* 99 108* 107* 100*    Recent Results (from the past 240 hour(s))  MRSA PCR Screening     Status: None   Collection Time: 05/22/16  1:26 PM  Result Value Ref Range Status   MRSA by PCR NEGATIVE NEGATIVE Final    Comment:        The GeneXpert MRSA Assay (FDA approved for NASAL specimens only), is one component of a comprehensive MRSA colonization surveillance program. It is not intended to diagnose MRSA infection nor to guide or monitor treatment for MRSA infections.      Scheduled Meds: . insulin aspart  0-5 Units Subcutaneous QHS  . insulin aspart  0-9 Units Subcutaneous TID WC  . [START ON 05/26/2016] iron polysaccharides  150 mg Oral Daily  . sodium chloride flush  3 mL Intravenous Q12H     LOS: 3 days   Cherene Altes, MD [facility] Office  [phone] Pager - Text Page per Amion as per below:  On-Call/Text Page:       [email or web address]      password [email or web address]  If 7PM-7AM, please contact night-coverage [email or web address] Password [facility] 05/25/2016, 10:51 AM

## 2016-05-25 NOTE — Anesthesia Postprocedure Evaluation (Signed)
Anesthesia Post Note  Patient: William [name]  Procedure(s) Performed: Procedure(s) (LRB): ESOPHAGOGASTRODUODENOSCOPY (EGD) (N/A)  Patient location during evaluation: PACU Anesthesia Type: MAC Level of consciousness: awake and alert Pain management: pain level controlled Vital Signs Assessment: post-procedure vital signs reviewed and stable Respiratory status: spontaneous breathing Cardiovascular status: stable Anesthetic complications: no       Last Vitals:  Vitals:   05/25/16 1456 05/25/16 1600  BP: (!) 133/51   Pulse: (!) 105 94  Resp: 20 19  Temp:      Last Pain:  Vitals:   05/25/16 1456  TempSrc:   PainSc: 0-No pain                 Nolon Nations

## 2016-05-25 NOTE — Progress Notes (Signed)
Progress Note  Patient Name: William [name] Date of Encounter: 05/25/2016  Primary Cardiologist: Claiborne Billings  Subjective   Feels better;  no CP/SOB s/p 3 units PRBC Tx. Breathing better  Inpatient Medications    Scheduled Meds: . insulin aspart  0-5 Units Subcutaneous QHS  . insulin aspart  0-9 Units Subcutaneous TID WC  . sodium chloride flush  3 mL Intravenous Q12H   Continuous Infusions: . potassium chloride (KCL MULTIRUN) 30 mEq in 265 mL IVPB 30 mEq (05/25/16 0719)   PRN Meds: acetaminophen **OR** acetaminophen, hydrALAZINE, ondansetron **OR** ondansetron (ZOFRAN) IV   Vital Signs    Vitals:   05/25/16 0200 05/25/16 0400 05/25/16 0523 05/25/16 0803  BP: (!) 99/47 (!) 102/56  (!) 105/52  Pulse: 91 95  (!) 102  Resp: 19 18  18   Temp:  98.2 F (36.8 C)  97.8 F (36.6 C)  TempSrc:  Oral  Oral  SpO2: 94% 97%  95%  Weight:   270 lb 14.4 oz (122.9 kg)   Height:   5' 9"  (1.753 m)     Intake/Output Summary (Last 24 hours) at 05/25/16 0957 Last data filed at 05/25/16 0804  Gross per 24 hour  Intake              985 ml  Output             1950 ml  Net             -965 ml    I/O since admission:  -1748  Filed Weights   05/24/16 0435 05/24/16 1434 05/25/16 0523  Weight: 279 lb 8 oz (126.8 kg) 270 lb 12.8 oz (122.8 kg) 270 lb 14.4 oz (122.9 kg)    Telemetry    Sinus rhythm in the 70s - Personally Reviewed  ECG    Will re-check ECG today, not done yesterday  05/23/16 ECG (independently read by me): NSR 74; 1st degree AV block; improved STT changes; IRBBB; inc QTc  Physical Exam    BP (!) 105/52 (BP Location: Right Arm)   Pulse (!) 102   Temp 97.8 F (36.6 C) (Oral)   Resp 18   Ht 5' 9"  (1.753 m)   Wt 270 lb 14.4 oz (122.9 kg)   SpO2 95%   BMI 40.00 kg/m    General: Alert, oriented, no distress.  Skin: normal turgor, no rashes, warm and dry HEENT: Normocephalic, atraumatic. Pupils equal round and reactive to light; sclera anicteric; extraocular  muscles intact;  Nose without nasal septal hypertrophy Mouth/Parynx benign; Mallinpatti scale 3 Neck: No JVD, no carotid bruits; normal carotid upstroke Lungs: clear to ausculatation and percussion; no wheezing or rales Chest wall: without tenderness to palpitation Heart: PMI not displaced, RRR, s1 s2 normal, 2/6 systolic murmur aortic area, no diastolic murmur, no rubs, gallops, thrills, or heaves Abdomen: abdominal obesity; soft, nontender; no hepatosplenomehaly, BS+; abdominal aorta nontender and not dilated by palpation. Back: no CVA tenderness Pulses 2+ Musculoskeletal: full range of motion, normal strength, no joint deformities Extremities:  1+ LE edema; no clubbing cyanosis, Homan's sign negative  Neurologic: grossly nonfocal; Cranial nerves grossly wnl Psychologic: Normal mood and affect   Labs    Chemistry  Recent Labs Lab 05/23/16 0259 05/23/16 1814 05/24/16 0200 05/25/16 0358  NA 137  --  137 136  K 4.2  --  3.5 3.2*  CL 102  --  103 102  CO2 28  --  24 25  GLUCOSE 95  --  93 97  BUN 25*  --  18 10  CREATININE 1.62*  --  1.33* 1.08  CALCIUM 9.0  --  8.6* 8.5*  PROT  --  7.0 6.7 6.2*  ALBUMIN  --  3.0* 2.9* 2.8*  AST  --  40 31 30  ALT  --  28 25 23   ALKPHOS  --  71 65 65  BILITOT  --  7.6* 7.5* 7.7*  GFRNONAA 43*  --  54* >60  GFRAA 49*  --  >60 >60  ANIONGAP 7  --  10 9     Hematology  Recent Labs Lab 05/23/16 1709 05/24/16 0200 05/25/16 0358  WBC 6.5 6.3 6.0  RBC 3.17* 3.12* 3.03*  HGB 8.1* 8.0* 7.6*  HCT 25.4* 25.1* 24.6*  MCV 80.1 80.4 81.2  MCH 25.6* 25.6* 25.1*  MCHC 31.9 31.9 30.9  RDW 15.6* 16.0* 16.3*  PLT 106* 95* 92*    Cardiac Enzymes  Recent Labs Lab 05/22/16 1434  TROPONINI 0.11*     Recent Labs Lab 05/22/16 0819  TROPIPOC 0.07     BNP  Recent Labs Lab 05/22/16 0713  BNP 765.6*     DDimer No results for input(s): DDIMER in the last 168 hours.   Lipid Panel     Component Value Date/Time   CHOL 204 (H)  04/01/2016 0952   CHOL 135 11/29/2013 0940   TRIG 119.0 04/01/2016 0952   TRIG 177 (H) 11/29/2013 0940   HDL 29.70 (L) 04/01/2016 0952   HDL 34 (L) 11/29/2013 0940   CHOLHDL 7 04/01/2016 0952   VLDL 23.8 04/01/2016 0952   LDLCALC 151 (H) 04/01/2016 0952   LDLCALC 66 11/29/2013 0940    Radiology    US Abdomen Complete W/elastography  Result Date: 05/24/2016 CLINICAL DATA:  Nonalcoholic steatohepatitis. Elevated liver function tests. Abdominal pain. EXAM: ULTRASOUND ABDOMEN ULTRASOUND HEPATIC ELASTOGRAPHY TECHNIQUE: Sonography of the upper abdomen was performed. In addition, ultrasound elastography evaluation of the liver was performed. A region of interest was placed within the right lobe of the liver. Following application of a compressive sonographic pulse, shear waves were detected in the adjacent hepatic tissue and the shear wave velocity was calculated. Multiple assessments were performed at the selected site. Median shear wave velocity is correlated to a Metavir fibrosis score. COMPARISON:  03/15/2013 MRI abdomen.  03/08/2013 CT abdomen/pelvis. FINDINGS: ULTRASOUND ABDOMEN Gallbladder: Numerous layering subcentimeter calcified gallstones. Mild gallbladder wall thickening. No pericholecystic fluid. No sonographic Murphy's sign. Common bile duct: Diameter: 4 mm Liver: Diffusely irregular liver surface and coarsened liver parenchymal echotexture, compatible with cirrhosis. Liver parenchyma is diffusely mildly echogenic, which may indicate hepatic steatosis. No liver mass detected. IVC: No abnormality visualized. Pancreas: Poorly visualized due to patient body habitus and overlying bowel gas. Spleen: Mildly enlarged spleen (craniocaudal splenic length 13.0 cm). No splenic mass. Splenic dimensions 13.0 x 5.2 x 14.5 cm (volume = 510 cm^3). Right Kidney: Length: 13.1 cm. Normal right renal parenchymal echogenicity and thickness. No right hydronephrosis. Simple 4.6 x 3.9 x 3.5 cm lateral interpolar  right renal cyst. Left Kidney: Length: 12.5 cm. Echogenicity within normal limits. No mass or hydronephrosis visualized. Abdominal aorta: No aneurysm visualized. Other findings: Small volume ascites throughout all 4 quadrants of the abdomen. ULTRASOUND HEPATIC ELASTOGRAPHY Device: Siemens Helix VTQ Patient position: Supine/ Left lateral decubitus Transducer 4V1 Number of measurements: 4 Hepatic segment:  8 Limitations of exam: Unable to obtain elastography results due to significant liver motion within the ascites, which persisted even with  suspended breathing. Please note that abnormal shear wave velocities may also be identified in clinical settings other than with hepatic fibrosis, such as: acute hepatitis, elevated right heart and central venous pressures including use of beta blockers, veno-occlusive disease (Budd-Chiari), infiltrative processes such as mastocytosis/amyloidosis/infiltrative tumor, extrahepatic cholestasis, in the post-prandial state, and liver transplantation. Correlation with patient history, laboratory data, and clinical condition recommended. IMPRESSION: ULTRASOUND ABDOMEN: 1. Morphologic changes of hepatic cirrhosis. No liver mass detected. 2. Secondary findings of portal hypertension including small volume ascites and mild splenomegaly. 3. Cholelithiasis. Mild diffuse gallbladder wall thickening. No pericholecystic fluid or sonographic Murphy's sign. Findings are nonspecific. The gallbladder wall thickening may be due to noninflammatory edema (such as due to portal hypertension or hypoalbuminemia). If there is clinical concern for acute cholecystitis, hepatobiliary scintigraphy study may be performed. 4. No biliary ductal dilatation. 5. Simple right renal cyst. ULTRASOUND HEPATIC ELASTOGRAPHY: Technically unsuccessful hepatic elastography. Unable to obtain elastography results despite multiple attempts due to significant liver motion within the ascites, which persisted even with suspended  breathing. Electronically Signed   By: Ilona Sorrel M.D.   On: 05/24/2016 18:35    Cardiac Studies   ------------------------------------------------------------------- 05/19/2016  ECHO Study Conclusions  - Left ventricle: The cavity size was normal. Wall thickness was   increased in a pattern of mild LVH. Systolic function was normal.   The estimated ejection fraction was in the range of 60% to 65%.   Wall motion was normal; there were no regional wall motion   abnormalities. Doppler parameters are consistent with   pseudonormal left ventricular relaxation (grade 2 diastolic   dysfunction). The E/e&' ratio is >15, suggesting elevated LV   filling pressure. - Aortic valve: Mildly calcified leaflets. Mild stenosis. Mean   gradient (S): 23 mm Hg. Peak gradient (S): 36 mm Hg. Valve area   (VTI): 1.77 cm^2. Valve area (Vmax): 1.82 cm^2. Valve area   (Vmean): 1.68 cm^2. - Aorta: Dilated aortic root and ascending aorta. Aortic root   dimension: 41 mm (ED). Ascending aortic diameter: 42 mm (S). - Mitral valve: Mildly thickened leaflets . There was trivial   regurgitation. - Left atrium: The atrium was mildly dilated. - Right atrium: The atrium was mildly dilated. - Tricuspid valve: There was mild regurgitation. - Pulmonary arteries: PA peak pressure: 28 mm Hg (S). - Inferior vena cava: The vessel was normal in size. The   respirophasic diameter changes were in the normal range (>= 50%),   consistent with normal central venous pressure.  Impressions:  - LVEF 60-65%, mild LVH, normal wall motion, grade 2 DD with   elevated LV filling pressure, mild aortic stenosis - AVA around   1.8 cm2 based on LVOT diameter of 2.2 cm, dilated aortic root to   4.1 cm and ascending aorta to 4.2 cm, trivial MR, mild biatrial   enlargement, mild TR, RVSP 28 mmHg, normal IVC.  Patient Profile     66 y.o. male  with a past medical history significant for coronary artery disease s/p CABG 2008  (LIMA to  the LAD, SVG to the RCA, SVG to the circumflex ), PAD, AAA, diabetes mellitus type 2 on metformin monotherapy, hyperlipidemia intolerant to statins, probable fatty liver, hypertension admitted with lower extremity edema, orthopnea, 16 pound weight gain and abnormal ECG in the setting of severe anemia.    Assessment & Plan    1. Acute blood loss/iron deficiency anemia; s/p 3 units PRBC. Guiac positive, Ferritin 8  Fe sat 4%.  No significant increase in Hb despite additional unit transfusion.   Received  furosemide prior to 3rd  transfusion;  H/H today is slightly further reduced at 7.6/24.6.  Suspect UGI source with recent mid epigastric bloated.  H/o colonic polyps. Plan is for EGD today. Will add iron supplementation since not yet done.  Platelets today 92K.  2. Acute on chronic diastolic heart failure.  Mild troponin increase is probably due to demand ischemia. Good diuresis - 1748 since admissiion  3 CAD; s/p CABG 03/2006; suspect anemia related ischemia; currently no chest pain and ECG improved on 4/16, check today  4. Essential HTN; BP stable now 144 -315 systolically. Pulse in th 90s; may need low dose BB   5. DM   6. Probable fatty liver  7. Hyperlipidemia with inability to take numerous statins, with LFT elevation  under evaluation for Repatha approval  8. Mild AS on recent echo with mean gradient 23, peak 36 mm Hg; AVA ~1.8 cm2  9. Mild AAA  10. Mild LE edema  11. Stage 3 CKD  Avoid ACE-I/ARB for now;   Cr was 0.98 11/2015 and 1.22 03/2016.  Now improving Cr 1.62 >1.33>1.08 today  For endoscospy today; current guidelines do not require SBE prophylaxis.   Signed, Troy Sine, MD, [facility] 05/25/2016, 9:57 AM

## 2016-05-25 NOTE — Op Note (Addendum)
[facility] Patient Name: William [name] Procedure Date : 05/25/2016 MRN: [id number] Attending MD: Juanita Craver , MD Date of Birth: 12-Oct-1950 CSN: [id number] Age: 66 Admit Type: Inpatient Procedure:                Diagnostic EGD Indications:              Iron deficiency anemia, Gastro-esophageal reflux                            disease, Heme positive stool Providers:                Juanita Craver, MD, Carmie End, RN, Corliss Parish, Technician, Winfield Cunas, CRNA Referring MD:             Shelva Majestic, MD, Tammi Sou, MD Medicines:                Monitored Anesthesia Care Complications:            No immediate complications. Estimated Blood Loss:     Estimated blood loss: none. Procedure:                Pre-anesthesia assessment: - Prior to the                            procedure, a history and physical was performed,                            and patient medications and allergies were                            reviewed. The patient's tolerance of previous                            anesthesia was also reviewed. The risks and                            benefits of the procedure and the sedation options                            and risks were discussed with the patient. All                            questions were answered, and informed consent was                            obtained. Prior anticoagulants: The patient has                            taken no previous anticoagulant or antiplatelet                            agents. ASA Grade assessment: III - A patient with  severe systemic disease. After reviewing the risks                            and benefits, the patient was deemed in                            satisfactory condition to undergo the procedure.                            After obtaining informed consent, the endoscope was                            passed under direct vision.  Throughout the                            procedure, the patient's blood pressure, pulse, and                            oxygen saturations were monitored continuously. The                            [id number] ([id number]) scope was introduced through the                            mouth, and advanced to the second part of duodenum.                            The EGD was accomplished without difficulty. The                            patient tolerated the procedure well. Scope In: Scope Out: Findings:      The examined esophagus and GEJ was normal.      The entire examined stomach was normal.      The cardia and gastric fundus were normal on retroflexion.      The examined duodenum was normal. Impression:               - Normal appearing esophagus and GEJ.                           - Normal stomach.                           - Normal examined duodenum.                           - No specimens collected. Moderate Sedation:      MAC used. Recommendation:           - Full liquid diet today.                           - Continue present medications. Procedure Code(s):        --- Professional ---  02774, Esophagogastroduodenoscopy, flexible,                            transoral; diagnostic, including collection of                            specimen(s) by brushing or washing, when performed                            (separate procedure) Diagnosis Code(s):        --- Professional ---                           D50.9, Iron deficiency anemia, unspecified                           R19.5, Other fecal abnormalities                           K21.9, Gastro-esophageal reflux disease without                            esophagitis CPT copyright 2016 [employer]. All rights reserved. The codes documented in this report are preliminary and upon coder review may  be revised to meet current compliance requirements. Juanita Craver, MD Juanita Craver, MD 05/25/2016  2:44:57 PM This report has been signed electronically. Number of Addenda: 0

## 2016-05-26 DIAGNOSIS — I5033 Acute on chronic diastolic (congestive) heart failure: Secondary | ICD-10-CM

## 2016-05-26 DIAGNOSIS — I5032 Chronic diastolic (congestive) heart failure: Secondary | ICD-10-CM

## 2016-05-26 LAB — CBC
HCT: 25 % — ABNORMAL LOW (ref 39.0–52.0)
Hemoglobin: 7.8 g/dL — ABNORMAL LOW (ref 13.0–17.0)
MCH: 25.7 pg — ABNORMAL LOW (ref 26.0–34.0)
MCHC: 31.2 g/dL (ref 30.0–36.0)
MCV: 82.2 fL (ref 78.0–100.0)
PLATELETS: 94 10*3/uL — AB (ref 150–400)
RBC: 3.04 MIL/uL — ABNORMAL LOW (ref 4.22–5.81)
RDW: 16.3 % — AB (ref 11.5–15.5)
WBC: 5.6 10*3/uL (ref 4.0–10.5)

## 2016-05-26 LAB — TYPE AND SCREEN
ABO/RH(D): O POS
ANTIBODY SCREEN: NEGATIVE
UNIT DIVISION: 0
UNIT DIVISION: 0
UNIT DIVISION: 0
Unit division: 0

## 2016-05-26 LAB — BPAM RBC
BLOOD PRODUCT EXPIRATION DATE: 201805052359
BLOOD PRODUCT EXPIRATION DATE: 201805052359
BLOOD PRODUCT EXPIRATION DATE: 201805152359
Blood Product Expiration Date: 201805092359
ISSUE DATE / TIME: 201804150914
ISSUE DATE / TIME: 201804151431
ISSUE DATE / TIME: 201804161230
ISSUE DATE / TIME: 201804181215
UNIT TYPE AND RH: 5100
UNIT TYPE AND RH: 5100
UNIT TYPE AND RH: 5100
UNIT TYPE AND RH: 5100

## 2016-05-26 LAB — BILIRUBIN, FRACTIONATED(TOT/DIR/INDIR)
BILIRUBIN TOTAL: 8.2 mg/dL — AB (ref 0.3–1.2)
Bilirubin, Direct: 1.3 mg/dL — ABNORMAL HIGH (ref 0.1–0.5)
Indirect Bilirubin: 6.9 mg/dL — ABNORMAL HIGH (ref 0.3–0.9)

## 2016-05-26 LAB — COMPREHENSIVE METABOLIC PANEL
ALBUMIN: 2.6 g/dL — AB (ref 3.5–5.0)
ALT: 23 U/L (ref 17–63)
ANION GAP: 9 (ref 5–15)
AST: 31 U/L (ref 15–41)
Alkaline Phosphatase: 65 U/L (ref 38–126)
BILIRUBIN TOTAL: 7.9 mg/dL — AB (ref 0.3–1.2)
BUN: 6 mg/dL (ref 6–20)
CHLORIDE: 104 mmol/L (ref 101–111)
CO2: 24 mmol/L (ref 22–32)
Calcium: 8.3 mg/dL — ABNORMAL LOW (ref 8.9–10.3)
Creatinine, Ser: 0.92 mg/dL (ref 0.61–1.24)
GFR calc Af Amer: >60 mL/min (ref >60)
GFR calc non Af Amer: >60 mL/min (ref >60)
Glucose, Bld: 91 mg/dL (ref 65–99)
POTASSIUM: 3.3 mmol/L — AB (ref 3.5–5.1)
Sodium: 137 mmol/L (ref 135–145)
TOTAL PROTEIN: 6 g/dL — AB (ref 6.5–8.1)

## 2016-05-26 LAB — GLUCOSE, CAPILLARY
GLUCOSE-CAPILLARY: 120 mg/dL — AB (ref 65–99)
GLUCOSE-CAPILLARY: 135 mg/dL — AB (ref 65–99)
Glucose-Capillary: 95 mg/dL (ref 65–99)
Glucose-Capillary: 102 mg/dL — ABNORMAL HIGH (ref 65–99)

## 2016-05-26 LAB — PREPARE RBC (CROSSMATCH)

## 2016-05-26 LAB — DIC (DISSEMINATED INTRAVASCULAR COAGULATION) PANEL
APTT: 45 s — AB (ref 24–36)
D DIMER QUANT: 3.37 ug{FEU}/mL — AB (ref 0.00–0.50)
FIBRINOGEN: 214 mg/dL (ref 210–475)
PROTHROMBIN TIME: 22.7 s — AB (ref 11.4–15.2)

## 2016-05-26 LAB — BRAIN NATRIURETIC PEPTIDE: B NATRIURETIC PEPTIDE 5: 343.4 pg/mL — AB (ref 0.0–100.0)

## 2016-05-26 LAB — DIC (DISSEMINATED INTRAVASCULAR COAGULATION)PANEL
INR: 1.97
Platelets: 88 10*3/uL — ABNORMAL LOW (ref 150–400)

## 2016-05-26 LAB — MAGNESIUM: MAGNESIUM: 2.1 mg/dL (ref 1.7–2.4)

## 2016-05-26 MED ORDER — SODIUM CHLORIDE 0.9 % IV SOLN
INTRAVENOUS | Status: DC
  Administered 2016-05-27: 05:00:00 via INTRAVENOUS

## 2016-05-26 MED ORDER — SODIUM CHLORIDE 0.9 % IV SOLN
Freq: Once | INTRAVENOUS | Status: AC
  Administered 2016-05-26: 13:00:00 via INTRAVENOUS

## 2016-05-26 MED ORDER — POTASSIUM CHLORIDE CRYS ER 20 MEQ PO TBCR
40.0000 meq | EXTENDED_RELEASE_TABLET | Freq: Two times a day (BID) | ORAL | Status: DC
  Administered 2016-05-26 – 2016-05-29 (×7): 40 meq via ORAL
  Filled 2016-05-26 (×7): qty 2

## 2016-05-26 MED ORDER — PEG 3350-KCL-NA BICARB-NACL 420 G PO SOLR
4000.0000 mL | Freq: Once | ORAL | Status: AC
  Administered 2016-05-26: 4000 mL via ORAL
  Filled 2016-05-26: qty 4000

## 2016-05-26 MED ORDER — FUROSEMIDE 10 MG/ML IJ SOLN: 40.0000 mg | Freq: Once | INTRAMUSCULAR | Status: DC

## 2016-05-26 MED ORDER — FUROSEMIDE 10 MG/ML IJ SOLN
40.0000 mg | Freq: Once | INTRAMUSCULAR | Status: AC
  Administered 2016-05-26: 40 mg via INTRAVENOUS
  Filled 2016-05-26: qty 4

## 2016-05-26 MED ORDER — METOPROLOL TARTRATE 12.5 MG HALF TABLET
12.5000 mg | ORAL_TABLET | Freq: Two times a day (BID) | ORAL | Status: DC
  Administered 2016-05-26 – 2016-05-29 (×7): 12.5 mg via ORAL
  Filled 2016-05-26 (×7): qty 1

## 2016-05-26 NOTE — Progress Notes (Signed)
CRITICAL VALUE ALERT  Critical value received: hgb 7.8, K 3.3   Date of notification: 05/26/16  Time of notification: none  Critical value read back:yes  Nurse who received alert:  Trilby Drummer( saw on epic)  MD notified (1st page): Enriqueta Shutter  Time of first page:  0610  MD notified (2nd page):  Time of second page:  Responding MD: none  Time MD responded: none

## 2016-05-26 NOTE — Progress Notes (Signed)
Progress Note  Patient Name: William [name] Date of Encounter: 05/26/2016  Primary Cardiologist: Claiborne Billings  Subjective   Feels better;  no CP/SOB s/p 4 units PRBC Tx. Breathing better; to get another unit of PRBC today  Inpatient Medications    Scheduled Meds: . furosemide  40 mg Intravenous Once  . insulin aspart  0-5 Units Subcutaneous QHS  . insulin aspart  0-9 Units Subcutaneous TID WC  . iron polysaccharides  150 mg Oral Daily  . potassium chloride  40 mEq Oral BID  . sodium chloride flush  3 mL Intravenous Q12H   Continuous Infusions: . sodium chloride    . sodium chloride    . iron dextran (INFED/DEXFERRRUM) 500 MG IVPB 500 mg (05/26/16 1000)   PRN Meds: acetaminophen **OR** acetaminophen, hydrALAZINE, ondansetron **OR** ondansetron (ZOFRAN) IV   Vital Signs    Vitals:   05/26/16 0400 05/26/16 0416 05/26/16 0600 05/26/16 0807  BP: (!) 116/57 (!) 116/57 134/66 130/66  Pulse: (!) 103 (!) 101 91 97  Resp: 16 16 10 15   Temp:  98.6 F (37 C)  98.2 F (36.8 C)  TempSrc:  Oral  Oral  SpO2: 96% 94% (!) 89% 94%  Weight:  278 lb 6.4 oz (126.3 kg)    Height:        Intake/Output Summary (Last 24 hours) at 05/26/16 1120 Last data filed at 05/26/16 0900  Gross per 24 hour  Intake              775 ml  Output              650 ml  Net              125 ml    I/O since admission:  -[id number]  Filed Weights   05/24/16 1434 05/25/16 0523 05/26/16 0416  Weight: 270 lb 12.8 oz (122.8 kg) 270 lb 14.4 oz (122.9 kg) 278 lb 6.4 oz (126.3 kg)    Telemetry    Sinus rhythm in the 70s - Personally Reviewed  ECG   05/25/16 ECG (independently read by me): NSR 97; T inversion I, aVL, PRWP with Q V1-2  05/23/16 ECG (independently read by me): NSR 74; 1st degree AV block; improved STT changes; IRBBB; inc QTc  Physical Exam    BP 130/66 (BP Location: Left Arm)   Pulse 97   Temp 98.2 F (36.8 C) (Oral)   Resp 15   Ht 5' 9"  (1.753 m)   Wt 278 lb 6.4 oz (126.3 kg)   SpO2 94%    BMI 41.11 kg/m    General: Alert, oriented, no distress.  Skin: normal turgor, no rashes, warm and dry HEENT: Normocephalic, atraumatic. Pupils equal round and reactive to light; sclera anicteric; extraocular muscles intact;  Nose without nasal septal hypertrophy Mouth/Parynx benign; Mallinpatti scale 3 Neck: No JVD, no carotid bruits; normal carotid upstroke Lungs: clear to ausculatation and percussion; no wheezing or rales Chest wall: without tenderness to palpitation Heart: PMI not displaced, RRR, s1 s2 normal, 2/6 systolic murmur aortic area, no diastolic murmur, no rubs, gallops, thrills, or heaves Abdomen: abdominal obesity; soft, nontender; no hepatosplenomehaly, BS+; abdominal aorta nontender and not dilated by palpation. Back: no CVA tenderness Pulses 2+ Musculoskeletal: full range of motion, normal strength, no joint deformities Extremities:  LE edema; no clubbing cyanosis, Homan's sign negative  Neurologic: grossly nonfocal; Cranial nerves grossly wnl Psychologic: Normal mood and affect   Labs    Chemistry  Recent Labs Lab  05/24/16 0200 05/25/16 0358 05/26/16 0307  NA 137 136 137  K 3.5 3.2* 3.3*  CL 103 102 104  CO2 24 25 24   GLUCOSE 93 97 91  BUN 18 10 6   CREATININE 1.33* 1.08 0.92  CALCIUM 8.6* 8.5* 8.3*  PROT 6.7 6.2* 6.0*  ALBUMIN 2.9* 2.8* 2.6*  AST 31 30 31   ALT 25 23 23   ALKPHOS 65 65 65  BILITOT 7.5* 7.7* 7.9*  GFRNONAA 54* >60 >60  GFRAA >60 >60 >60  ANIONGAP 10 9 9      Hematology  Recent Labs Lab 05/25/16 0358 05/25/16 1903 05/26/16 0307  WBC 6.0 5.9 5.6  RBC 3.03* 3.29* 3.04*  HGB 7.6* 8.6* 7.8*  HCT 24.6* 27.1* 25.0*  MCV 81.2 82.4 82.2  MCH 25.1* 26.1 25.7*  MCHC 30.9 31.7 31.2  RDW 16.3* 16.1* 16.3*  PLT 92* 97* 94*    Cardiac Enzymes  Recent Labs Lab 05/22/16 1434  TROPONINI 0.11*     Recent Labs Lab 05/22/16 0819  TROPIPOC 0.07     BNP  Recent Labs Lab 05/22/16 0713 05/26/16 0307  BNP 765.6* 343.4*      DDimer No results for input(s): DDIMER in the last 168 hours.   Lipid Panel     Component Value Date/Time   CHOL 204 (H) 04/01/2016 0952   CHOL 135 11/29/2013 0940   TRIG 119.0 04/01/2016 0952   TRIG 177 (H) 11/29/2013 0940   HDL 29.70 (L) 04/01/2016 0952   HDL 34 (L) 11/29/2013 0940   CHOLHDL 7 04/01/2016 0952   VLDL 23.8 04/01/2016 0952   LDLCALC 151 (H) 04/01/2016 0952   LDLCALC 66 11/29/2013 0940    Radiology    US Abdomen Complete W/elastography  Result Date: 05/24/2016 CLINICAL DATA:  Nonalcoholic steatohepatitis. Elevated liver function tests. Abdominal pain. EXAM: ULTRASOUND ABDOMEN ULTRASOUND HEPATIC ELASTOGRAPHY TECHNIQUE: Sonography of the upper abdomen was performed. In addition, ultrasound elastography evaluation of the liver was performed. A region of interest was placed within the right lobe of the liver. Following application of a compressive sonographic pulse, shear waves were detected in the adjacent hepatic tissue and the shear wave velocity was calculated. Multiple assessments were performed at the selected site. Median shear wave velocity is correlated to a Metavir fibrosis score. COMPARISON:  03/15/2013 MRI abdomen.  03/08/2013 CT abdomen/pelvis. FINDINGS: ULTRASOUND ABDOMEN Gallbladder: Numerous layering subcentimeter calcified gallstones. Mild gallbladder wall thickening. No pericholecystic fluid. No sonographic Murphy's sign. Common bile duct: Diameter: 4 mm Liver: Diffusely irregular liver surface and coarsened liver parenchymal echotexture, compatible with cirrhosis. Liver parenchyma is diffusely mildly echogenic, which may indicate hepatic steatosis. No liver mass detected. IVC: No abnormality visualized. Pancreas: Poorly visualized due to patient body habitus and overlying bowel gas. Spleen: Mildly enlarged spleen (craniocaudal splenic length 13.0 cm). No splenic mass. Splenic dimensions 13.0 x 5.2 x 14.5 cm (volume = 510 cm^3). Right Kidney: Length: 13.1 cm.  Normal right renal parenchymal echogenicity and thickness. No right hydronephrosis. Simple 4.6 x 3.9 x 3.5 cm lateral interpolar right renal cyst. Left Kidney: Length: 12.5 cm. Echogenicity within normal limits. No mass or hydronephrosis visualized. Abdominal aorta: No aneurysm visualized. Other findings: Small volume ascites throughout all 4 quadrants of the abdomen. ULTRASOUND HEPATIC ELASTOGRAPHY Device: Siemens Helix VTQ Patient position: Supine/ Left lateral decubitus Transducer 4V1 Number of measurements: 4 Hepatic segment:  8 Limitations of exam: Unable to obtain elastography results due to significant liver motion within the ascites, which persisted even with suspended breathing. Please note  that abnormal shear wave velocities may also be identified in clinical settings other than with hepatic fibrosis, such as: acute hepatitis, elevated right heart and central venous pressures including use of beta blockers, veno-occlusive disease (Budd-Chiari), infiltrative processes such as mastocytosis/amyloidosis/infiltrative tumor, extrahepatic cholestasis, in the post-prandial state, and liver transplantation. Correlation with patient history, laboratory data, and clinical condition recommended. IMPRESSION: ULTRASOUND ABDOMEN: 1. Morphologic changes of hepatic cirrhosis. No liver mass detected. 2. Secondary findings of portal hypertension including small volume ascites and mild splenomegaly. 3. Cholelithiasis. Mild diffuse gallbladder wall thickening. No pericholecystic fluid or sonographic Murphy's sign. Findings are nonspecific. The gallbladder wall thickening may be due to noninflammatory edema (such as due to portal hypertension or hypoalbuminemia). If there is clinical concern for acute cholecystitis, hepatobiliary scintigraphy study may be performed. 4. No biliary ductal dilatation. 5. Simple right renal cyst. ULTRASOUND HEPATIC ELASTOGRAPHY: Technically unsuccessful hepatic elastography. Unable to obtain  elastography results despite multiple attempts due to significant liver motion within the ascites, which persisted even with suspended breathing. Electronically Signed   By: Ilona Sorrel M.D.   On: 05/24/2016 18:35    Cardiac Studies   ------------------------------------------------------------------- 05/19/2016  ECHO Study Conclusions  - Left ventricle: The cavity size was normal. Wall thickness was   increased in a pattern of mild LVH. Systolic function was normal.   The estimated ejection fraction was in the range of 60% to 65%.   Wall motion was normal; there were no regional wall motion   abnormalities. Doppler parameters are consistent with   pseudonormal left ventricular relaxation (grade 2 diastolic   dysfunction). The E/e&' ratio is >15, suggesting elevated LV   filling pressure. - Aortic valve: Mildly calcified leaflets. Mild stenosis. Mean   gradient (S): 23 mm Hg. Peak gradient (S): 36 mm Hg. Valve area   (VTI): 1.77 cm^2. Valve area (Vmax): 1.82 cm^2. Valve area   (Vmean): 1.68 cm^2. - Aorta: Dilated aortic root and ascending aorta. Aortic root   dimension: 41 mm (ED). Ascending aortic diameter: 42 mm (S). - Mitral valve: Mildly thickened leaflets . There was trivial   regurgitation. - Left atrium: The atrium was mildly dilated. - Right atrium: The atrium was mildly dilated. - Tricuspid valve: There was mild regurgitation. - Pulmonary arteries: PA peak pressure: 28 mm Hg (S). - Inferior vena cava: The vessel was normal in size. The   respirophasic diameter changes were in the normal range (>= 50%),   consistent with normal central venous pressure.  Impressions:  - LVEF 60-65%, mild LVH, normal wall motion, grade 2 DD with   elevated LV filling pressure, mild aortic stenosis - AVA around   1.8 cm2 based on LVOT diameter of 2.2 cm, dilated aortic root to   4.1 cm and ascending aorta to 4.2 cm, trivial MR, mild biatrial   enlargement, mild TR, RVSP 28 mmHg, normal  IVC.  Patient Profile     66 y.o. male  with a past medical history significant for coronary artery disease s/p CABG 2008  (LIMA to the LAD, SVG to the RCA, SVG to the circumflex ), PAD, AAA, diabetes mellitus type 2 on metformin monotherapy, hyperlipidemia intolerant to statins, probable fatty liver, hypertension admitted with lower extremity edema, orthopnea, 16 pound weight gain and abnormal ECG in the setting of severe anemia.    Assessment & Plan    1. Acute blood loss/iron deficiency anemia; s/p 3 units PRBC. Guiac positive, Ferritin 8  Fe sat 4%.   No significant increase in  Hb despite additional unit transfusion.   Received  furosemide prior to 3rd  Transfusion.  He received a 4 th unit PRBC yesterday without furosemide according to wife and patient; H/H today continues to be  reduced at 7.8/25.0   Platelets today 94K.  Endoscopy did not reveal abnormality.  For colonscpoy tomorrow.  Getting iv Iron now.   2. Acute on chronic diastolic heart failure.  Mild troponin increase is probably due to demand ischemia. Good diuresis - 1748 since admission.  Weight increased since yesterday.  Will give iv lasix 40 mg now in anticipation of his additional PRBC to be given today.    3 CAD; s/p CABG 03/2006; suspect anemia related ischemia; currently no chest pain and ECG improved on 4/16, check today  4. Essential HTN; BP stable now 797 -282 systolically. Pulse in 90 - 100 range; will start low dose BB   5. DM   6. Probable fatty liver  7. Hyperlipidemia with inability to take numerous statins, with LFT elevation  under evaluation for Repatha approval  8. Mild AS on recent echo with mean gradient 23, peak 36 mm Hg; AVA ~1.8 cm2  9. Mild AAA  10. Mild LE edema  11. Stage 3 CKD  Avoid ACE-I/ARB for now;   Cr was 0.98 11/2015 and 1.22 03/2016.  Now improving Cr 1.62 >1.33>1.08 > 0.92 today.   12 Hypokalemia 3.3; replete to 4.     Signed, Troy Sine, MD, [facility] 05/26/2016, 11:20 AM

## 2016-05-26 NOTE — Progress Notes (Signed)
[place] TEAM 1 - Stepdown/ICU TEAM  William [name]  TKP:[id number] DOB: October 13, 1950 DOA: 05/22/2016 PCP: Tammi Sou, MD    Brief Narrative:  66 yo male w/ history of AAA, diabetes, HTN, microscopic hematuria, and CAD who presented to the ED w/ dyspnea on exertion worsening over 7 days.  The day of admission patient could not even tie his shoes without becoming dyspneic.   In the ED his Hgb was found to be acutely low at 6.3 w/ guaiac + stool. Chest x-ray showed pulmonary edema. Blood work showed acute kidney injury.   Subjective: Feels better;  no CP/SOB s/p 3 units PRBC Tx. Breathing better, had one episode of blood in his stool yesterday  Assessment & Plan: GIB - ?Upper  EGD/18-normal, recommended full liquid diet. No evidence of overt GI bleed Further plan of care as per GI. FOBT positive  May need a colonoscopy this admission    Acute blood loss anemia - Fe deficiency  Status post 3 units of packed red blood cells, Goal Hgb is 8.0 in setting of CAD - transfuse as needed - s/p 3U PRBC thus far - hemoglobin drop from 8.6 >7.8. We'll transfuse 1 more unit  Recent Labs Lab 05/23/16 1709 05/24/16 0200 05/25/16 0358 05/25/16 1903 05/26/16 0307  HGB 8.1* 8.0* 7.6* 8.6* 7.8*    Acute on chronic diastolic CHF - Acute pulmonary edema  Mild troponin increase is probably due to demand ischemia,well compensated at this time - follow for decompensation w/ blood transfusion - give lasix post transfusion as precaution   Mild AS on recent echo with mean gradient 23, peak 36 mm Hg; AVA ~1.8 cm2  Stage 3 CKD  Avoid ACE-I/ARB for now, Acute kidney injury  1.62 >1.33>1.08 >0.92 Corrected w/ volume expansion/transfusion - avoid contrast/ACEi/ARB   Fatty liver  Pt has been informed of this in the past per his report - [place] this admit suggests progressive changes c/w cirrhosis w/ portal HTN - discussed link to obesity and need to lose weight - educate further - raises concern UGIB could  be due to varicese - further recs per GI   CAD s/p CABG 2008 Cardiology is following   suspect anemia related ischemia; currently no chest pain and ECG improved on 4/16,   Hypokalemia  Supplement and follow  HTN BP well controlled at present   DM CBG well controlled   Morbid obesity - Body mass index is 41.11 kg/m.   DVT prophylaxis: SCDs Code Status: FULL CODE Family Communication: Spoke with family at bedside Disposition Plan: Continue SDU , transfuse today   Consultants:  GI Cardiology  Procedures: none  Antimicrobials:  none   Objective: Blood pressure 130/66, pulse 97, temperature 98.2 F (36.8 C), temperature source Oral, resp. rate 15, height 5' 9"  (1.753 m), weight 126.3 kg (278 lb 6.4 oz), SpO2 94 %.  Intake/Output Summary (Last 24 hours) at 05/26/16 0903 Last data filed at 05/25/16 2200  Gross per 24 hour  Intake              775 ml  Output              525 ml  Net              250 ml   Filed Weights   05/24/16 1434 05/25/16 0523 05/26/16 0416  Weight: 122.8 kg (270 lb 12.8 oz) 122.9 kg (270 lb 14.4 oz) 126.3 kg (278 lb 6.4 oz)    Examination: General: No acute  respiratory distress - alert and pleasant  Lungs: Clear to auscultation bilaterally - no wheezing  Cardiovascular: RRR - 2/6 holosystolic M - no rub  Abdomen: Nontender, nondistended, obese, soft, bowel sounds positive, no rebound, no ascites, no appreciable mass Extremities: 1+ B LE edema persists   CBC:  Recent Labs Lab 05/22/16 0713 05/22/16 1810  05/23/16 1709 05/24/16 0200 05/25/16 0358 05/25/16 1903 05/26/16 0307  WBC 7.6 7.2  < > 6.5 6.3 6.0 5.9 5.6  NEUTROABS 3.5 3.5  --   --   --  3.2  --   --   HGB 6.3* 7.5*  < > 8.1* 8.0* 7.6* 8.6* 7.8*  HCT 20.1* 24.0*  < > 25.4* 25.1* 24.6* 27.1* 25.0*  MCV 80.1 80.8  < > 80.1 80.4 81.2 82.4 82.2  PLT 152 137*  < > 106* 95* 92* 97* 94*  < > = values in this interval not displayed. Basic Metabolic Panel:  Recent Labs Lab  05/22/16 0713 05/22/16 1434 05/23/16 0259 05/24/16 0200 05/25/16 0358 05/26/16 0307  NA 133*  --  137 137 136 137  K 3.8  --  4.2 3.5 3.2* 3.3*  CL 97*  --  102 103 102 104  CO2 25  --  28 24 25 24   GLUCOSE 138*  --  95 93 97 91  BUN 28*  --  25* 18 10 6   CREATININE 2.06*  --  1.62* 1.33* 1.08 0.92  CALCIUM 8.9  --  9.0 8.6* 8.5* 8.3*  MG  --  2.3  --   --  2.1 2.1  PHOS  --  4.3  --   --   --   --    GFR: Estimated Creatinine Clearance: 103.8 mL/min (by C-G formula based on SCr of 0.92 mg/dL).  Liver Function Tests:  Recent Labs Lab 05/22/16 0713 05/23/16 1814 05/24/16 0200 05/25/16 0358 05/26/16 0307  AST 36 40 31 30 31   ALT 29 28 25 23 23   ALKPHOS 74 71 65 65 65  BILITOT 4.1* 7.6* 7.5* 7.7* 7.9*  PROT 6.7 7.0 6.7 6.2* 6.0*  ALBUMIN 2.9* 3.0* 2.9* 2.8* 2.6*    Coagulation Profile:  Recent Labs Lab 05/22/16 0653 05/23/16 0259 05/23/16 1709  INR 1.74 1.77 1.78    Cardiac Enzymes:  Recent Labs Lab 05/22/16 1434  TROPONINI 0.11*    HbA1C: Hgb A1c MFr Bld  Date/Time Value Ref Range Status  04/01/2016 09:52 AM 6.7 (H) 4.6 - 6.5 % Final    Comment:    Glycemic Control Guidelines for People with Diabetes:Non Diabetic:  <6%Goal of Therapy: <7%Additional Action Suggested:  >8%   12/02/2015 10:20 AM 6.6 (H) 4.6 - 6.5 % Final    Comment:    Glycemic Control Guidelines for People with Diabetes:Non Diabetic:  <6%Goal of Therapy: <7%Additional Action Suggested:  >8%     CBG:  Recent Labs Lab 05/25/16 0803 05/25/16 1242 05/25/16 1741 05/25/16 2104 05/26/16 0806  GLUCAP 100* 95 115* 113* 95    Recent Results (from the past 240 hour(s))  MRSA PCR Screening     Status: None   Collection Time: 05/22/16  1:26 PM  Result Value Ref Range Status   MRSA by PCR NEGATIVE NEGATIVE Final    Comment:        The GeneXpert MRSA Assay (FDA approved for NASAL specimens only), is one component of a comprehensive MRSA colonization surveillance program. It is  not intended to diagnose MRSA infection nor to guide or monitor  treatment for MRSA infections.      Scheduled Meds: . furosemide  40 mg Intravenous Once  . insulin aspart  0-5 Units Subcutaneous QHS  . insulin aspart  0-9 Units Subcutaneous TID WC  . iron polysaccharides  150 mg Oral Daily  . potassium chloride  40 mEq Oral Once  . sodium chloride flush  3 mL Intravenous Q12H     LOS: 4 days     [facility] Office  [phone] Pager - Text Page per Shea Evans as per below:    If 7PM-7AM, please contact night-coverage [email or web address] Password [email or web address] 05/26/2016, 9:03 AM

## 2016-05-26 NOTE — Progress Notes (Signed)
Pt is halfway on his golytely and been going to the bathroom.

## 2016-05-26 NOTE — Progress Notes (Signed)
Subjective: Feeling well.  No complaints.  No overt evidence of GI bleeding.  Objective: Vital signs in last 24 hours: Temp:  [97.8 F (36.6 C)-98.6 F (37 C)] 98.3 F (36.8 C) (04/19 1226) Pulse Rate:  [81-105] 81 (04/19 1226) Resp:  [10-26] 22 (04/19 1226) BP: (102-146)/(45-68) 113/68 (04/19 1226) SpO2:  [89 %-100 %] 95 % (04/19 1226) Weight:  [126.3 kg (278 lb 6.4 oz)] 126.3 kg (278 lb 6.4 oz) (04/19 0416) Last BM Date: 05/25/16  Intake/Output from previous day: 04/18 0701 - 04/19 0700 In: 1040 [P.O.:240; I.V.:200; Blood:335; IV Piggyback:265] Out: 825 [Urine:825] Intake/Output this shift: Total I/O In: -  Out: 575 [Urine:575]  General appearance: alert and no distress GI: soft, non-tender; bowel sounds normal; no masses,  no organomegaly and mild hepatojugular reflux  Lab Results:  Recent Labs  05/25/16 0358 05/25/16 1903 05/26/16 0307 05/26/16 1037  WBC 6.0 5.9 5.6  --   HGB 7.6* 8.6* 7.8*  --   HCT 24.6* 27.1* 25.0*  --   PLT 92* 97* 94* 88*   BMET  Recent Labs  05/24/16 0200 05/25/16 0358 05/26/16 0307  NA 137 136 137  K 3.5 3.2* 3.3*  CL 103 102 104  CO2 24 25 24   GLUCOSE 93 97 91  BUN 18 10 6   CREATININE 1.33* 1.08 0.92  CALCIUM 8.6* 8.5* 8.3*   LFT  Recent Labs  05/26/16 0307 05/26/16 1037  PROT 6.0*  --   ALBUMIN 2.6*  --   AST 31  --   ALT 23  --   ALKPHOS 65  --   BILITOT 7.9* 8.2*  BILIDIR  --  1.3*  IBILI  --  6.9*   PT/INR  Recent Labs  05/23/16 1709 05/26/16 1037  LABPROT 20.9* 22.7*  INR 1.78 1.97   Hepatitis Panel No results for input(s): HEPBSAG, HCVAB, HEPAIGM, HEPBIGM in the last 72 hours. C-Diff No results for input(s): CDIFFTOX in the last 72 hours. Fecal Lactopherrin No results for input(s): FECLLACTOFRN in the last 72 hours.  Studies/Results: US Abdomen Complete W/elastography  Result Date: 05/24/2016 CLINICAL DATA:  Nonalcoholic steatohepatitis. Elevated liver function tests. Abdominal pain. EXAM:  ULTRASOUND ABDOMEN ULTRASOUND HEPATIC ELASTOGRAPHY TECHNIQUE: Sonography of the upper abdomen was performed. In addition, ultrasound elastography evaluation of the liver was performed. A region of interest was placed within the right lobe of the liver. Following application of a compressive sonographic pulse, shear waves were detected in the adjacent hepatic tissue and the shear wave velocity was calculated. Multiple assessments were performed at the selected site. Median shear wave velocity is correlated to a Metavir fibrosis score. COMPARISON:  03/15/2013 MRI abdomen.  03/08/2013 CT abdomen/pelvis. FINDINGS: ULTRASOUND ABDOMEN Gallbladder: Numerous layering subcentimeter calcified gallstones. Mild gallbladder wall thickening. No pericholecystic fluid. No sonographic Murphy's sign. Common bile duct: Diameter: 4 mm Liver: Diffusely irregular liver surface and coarsened liver parenchymal echotexture, compatible with cirrhosis. Liver parenchyma is diffusely mildly echogenic, which may indicate hepatic steatosis. No liver mass detected. IVC: No abnormality visualized. Pancreas: Poorly visualized due to patient body habitus and overlying bowel gas. Spleen: Mildly enlarged spleen (craniocaudal splenic length 13.0 cm). No splenic mass. Splenic dimensions 13.0 x 5.2 x 14.5 cm (volume = 510 cm^3). Right Kidney: Length: 13.1 cm. Normal right renal parenchymal echogenicity and thickness. No right hydronephrosis. Simple 4.6 x 3.9 x 3.5 cm lateral interpolar right renal cyst. Left Kidney: Length: 12.5 cm. Echogenicity within normal limits. No mass or hydronephrosis visualized. Abdominal aorta: No aneurysm visualized.  Other findings: Small volume ascites throughout all 4 quadrants of the abdomen. ULTRASOUND HEPATIC ELASTOGRAPHY Device: Siemens Helix VTQ Patient position: Supine/ Left lateral decubitus Transducer 4V1 Number of measurements: 4 Hepatic segment:  8 Limitations of exam: Unable to obtain elastography results due to  significant liver motion within the ascites, which persisted even with suspended breathing. Please note that abnormal shear wave velocities may also be identified in clinical settings other than with hepatic fibrosis, such as: acute hepatitis, elevated right heart and central venous pressures including use of beta blockers, veno-occlusive disease (Budd-Chiari), infiltrative processes such as mastocytosis/amyloidosis/infiltrative tumor, extrahepatic cholestasis, in the post-prandial state, and liver transplantation. Correlation with patient history, laboratory data, and clinical condition recommended. IMPRESSION: ULTRASOUND ABDOMEN: 1. Morphologic changes of hepatic cirrhosis. No liver mass detected. 2. Secondary findings of portal hypertension including small volume ascites and mild splenomegaly. 3. Cholelithiasis. Mild diffuse gallbladder wall thickening. No pericholecystic fluid or sonographic Murphy's sign. Findings are nonspecific. The gallbladder wall thickening may be due to noninflammatory edema (such as due to portal hypertension or hypoalbuminemia). If there is clinical concern for acute cholecystitis, hepatobiliary scintigraphy study may be performed. 4. No biliary ductal dilatation. 5. Simple right renal cyst. ULTRASOUND HEPATIC ELASTOGRAPHY: Technically unsuccessful hepatic elastography. Unable to obtain elastography results despite multiple attempts due to significant liver motion within the ascites, which persisted even with suspended breathing. Electronically Signed   By: Ilona Sorrel M.D.   On: 05/24/2016 18:35    Medications:  Scheduled: . insulin aspart  0-5 Units Subcutaneous QHS  . insulin aspart  0-9 Units Subcutaneous TID WC  . iron polysaccharides  150 mg Oral Daily  . metoprolol tartrate  12.5 mg Oral BID  . potassium chloride  40 mEq Oral BID  . sodium chloride flush  3 mL Intravenous Q12H   Continuous: . sodium chloride    . sodium chloride    . iron dextran  (INFED/DEXFERRRUM) 500 MG IVPB 500 mg (05/26/16 1000)    Assessment/Plan: 1) Anemia - Most likely multifactorial. 2) Diastolic CHF. 3) Thrombocytopenia. 4) Mild coagulopathy. 5) Hyperbilirubinemia.   The patient and his family report that he will receive his 5th unit of blood since his admission, however, there is no overt evidence of GI bleeding.  He does have an iron deficiency and he is heme positive, but I feel that his anemia is multifactorial.  D-Dimer is elevated along with a worsening thrombocytopenia, and mild coagulopathy.  A hemolytic anemia can explain the persistent anemia without overt GI bleeding and the unconjugated hyperbilirubinemia.  Regardless, I will pursue a colonoscopy tomorrow to ensure no colonic bleeding sites.  If the colonoscopy is negative, along with his negative EGD, a capsule endoscopy will be pursued.  The smear is pending at this time.  He does demonstrate a mild hepatojugular reflux and his BNP today was elevated in the 300 range with his diastolic heart failure.  No other liver enzyme abnormalities to correlate with the possibility of hepatic congestion from heart failure.    Plan: 1) Colonoscopy tomorrow. 2) Follow up peripheral smear. 3) Follow HGB.  It may be more beneficial to see if there is a consistent down trend with his HGB before transfusing.  LOS: 4 days   Darian Cansler D 05/26/2016, 12:40 PM

## 2016-05-27 ENCOUNTER — Inpatient Hospital Stay (HOSPITAL_COMMUNITY): Payer: 59 | Admitting: Certified Registered Nurse Anesthetist

## 2016-05-27 ENCOUNTER — Encounter (HOSPITAL_COMMUNITY)
Admission: EM | Disposition: A | Discharge status: Home / Self Care | Payer: Self-pay | Source: Home / Self Care | Attending: Internal Medicine

## 2016-05-27 ENCOUNTER — Encounter (HOSPITAL_COMMUNITY): Payer: Self-pay

## 2016-05-27 HISTORY — PX: COLONOSCOPY: SHX5424

## 2016-05-27 HISTORY — PX: GIVENS CAPSULE STUDY: SHX5432

## 2016-05-27 LAB — CBC
HCT: 29 % — ABNORMAL LOW (ref 39.0–52.0)
Hemoglobin: 9.4 g/dL — ABNORMAL LOW (ref 13.0–17.0)
MCH: 26.7 pg (ref 26.0–34.0)
MCHC: 32.4 g/dL (ref 30.0–36.0)
MCV: 82.4 fL (ref 78.0–100.0)
PLATELETS: 107 10*3/uL — AB (ref 150–400)
RBC: 3.52 MIL/uL — ABNORMAL LOW (ref 4.22–5.81)
RDW: 16.8 % — AB (ref 11.5–15.5)
WBC: 6.6 10*3/uL (ref 4.0–10.5)

## 2016-05-27 LAB — COMPREHENSIVE METABOLIC PANEL
ALT: 23 U/L (ref 17–63)
ANION GAP: 9 (ref 5–15)
AST: 33 U/L (ref 15–41)
Albumin: 2.7 g/dL — ABNORMAL LOW (ref 3.5–5.0)
Alkaline Phosphatase: 69 U/L (ref 38–126)
BUN: 6 mg/dL (ref 6–20)
CO2: 23 mmol/L (ref 22–32)
Calcium: 8.4 mg/dL — ABNORMAL LOW (ref 8.9–10.3)
Chloride: 105 mmol/L (ref 101–111)
Creatinine, Ser: 0.92 mg/dL (ref 0.61–1.24)
Glucose, Bld: 79 mg/dL (ref 65–99)
POTASSIUM: 3.9 mmol/L (ref 3.5–5.1)
Sodium: 137 mmol/L (ref 135–145)
Total Bilirubin: 8.6 mg/dL — ABNORMAL HIGH (ref 0.3–1.2)
Total Protein: 6.5 g/dL (ref 6.5–8.1)

## 2016-05-27 LAB — BPAM RBC
BLOOD PRODUCT EXPIRATION DATE: 201805162359
ISSUE DATE / TIME: 201804191255
UNIT TYPE AND RH: 5100

## 2016-05-27 LAB — GLUCOSE, CAPILLARY
GLUCOSE-CAPILLARY: 102 mg/dL — AB (ref 65–99)
GLUCOSE-CAPILLARY: 129 mg/dL — AB (ref 65–99)
Glucose-Capillary: 101 mg/dL — ABNORMAL HIGH (ref 65–99)
Glucose-Capillary: 109 mg/dL — ABNORMAL HIGH (ref 65–99)

## 2016-05-27 LAB — TYPE AND SCREEN
ABO/RH(D): O POS
ANTIBODY SCREEN: NEGATIVE
UNIT DIVISION: 0

## 2016-05-27 SURGERY — COLONOSCOPY
Anesthesia: Monitor Anesthesia Care

## 2016-05-27 SURGERY — IMAGING PROCEDURE, GI TRACT, INTRALUMINAL, VIA CAPSULE
Anesthesia: LOCAL

## 2016-05-27 MED ORDER — SODIUM CHLORIDE 0.9 % IV SOLN: INTRAVENOUS | Status: DC

## 2016-05-27 MED ORDER — PROPOFOL 10 MG/ML IV BOLUS
INTRAVENOUS | Status: DC | PRN
  Administered 2016-05-27: 30 mg via INTRAVENOUS
  Administered 2016-05-27: 20 mg via INTRAVENOUS

## 2016-05-27 MED ORDER — TORSEMIDE 20 MG PO TABS
40.0000 mg | ORAL_TABLET | Freq: Every day | ORAL | Status: DC
  Administered 2016-05-27 – 2016-05-29 (×3): 40 mg via ORAL
  Filled 2016-05-27 (×3): qty 2

## 2016-05-27 MED ORDER — PROPOFOL 500 MG/50ML IV EMUL
INTRAVENOUS | Status: DC | PRN
  Administered 2016-05-27: 75 ug/kg/min via INTRAVENOUS

## 2016-05-27 NOTE — Op Note (Signed)
[name] Procedure Date : 05/27/2016 MRN: [id number] Attending MD: Carol Ada , MD Date of Birth: 02-Sep-1950 CSN: [id number] Age: 66 Admit Type: Inpatient Procedure:                Colonoscopy Indications:              Heme positive stool, Iron deficiency anemia Providers:                Carol Ada, MD, Dortha Schwalbe RN, RN, Alfonso Patten, Technician, Pearline Cables, CRNA Referring MD:              Medicines:                Propofol per Anesthesia Complications:            No immediate complications. Estimated Blood Loss:     Estimated blood loss was minimal. Procedure:                Pre-Anesthesia Assessment:                           - Prior to the procedure, a History and Physical                            was performed, and patient medications and                            allergies were reviewed. The patient's tolerance of                            previous anesthesia was also reviewed. The risks                            and benefits of the procedure and the sedation                            options and risks were discussed with the patient.                            All questions were answered, and informed consent                            was obtained. Prior Anticoagulants: The patient has                            taken no previous anticoagulant or antiplatelet                            agents. ASA Grade Assessment: III - A patient with                            severe systemic disease. After reviewing the risks  and benefits, the patient was deemed in                            satisfactory condition to undergo the procedure.                           After obtaining informed consent, the colonoscope                            was passed under direct vision. Throughout the                            procedure, the patient's blood pressure, pulse, and                oxygen saturations were monitored continuously. The                            EC-[id number] ([id number]) scope was introduced through                            the anus and advanced to the the cecum, identified                            by appendiceal orifice and ileocecal valve. The                            colonoscopy was performed without difficulty. The                            patient tolerated the procedure well. The quality                            of the bowel preparation was good and fair. The                            ileocecal valve, appendiceal orifice, and rectum                            were photographed. Scope In: 7:35:48 AM Scope Out: 7:51:01 AM Scope Withdrawal Time: 0 hours 10 minutes 3 seconds  Total Procedure Duration: 0 hours 15 minutes 13 seconds  Findings:      A diffuse area of moderately erythematous mucosa was found in the       ascending colon and in the cecum. Biopsies were taken with a cold       forceps for histology.      In the proximal portion of the colon the prep was fair. Washing improved       the prep, but it was adequate to evaluate for any lesion that would       result in the necessity for 5 units of PRBC to be transfused. There was       evidence of congestion and erythema in the cecum and ascending colon. It       gave the appearance of "scales". 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 A  couple of biopsies were obtained to ensure that no other       abnormalities were giving this appearance. Impression:               - Preparation of the colon was fair.                           - Erythematous mucosa in the ascending colon and in                            the cecum. Biopsied. Moderate Sedation:      None Recommendation:           - Return patient to hospital ward for ongoing care.                           - Resume regular diet.                           - Continue present medications.                           -  Await pathology results.                           - Capsule endoscopy.                           - No repeat colonoscopy due to age and the absence                            of advanced adenomas, age, and severe comorbidities. Procedure Code(s):        --- Professional ---                           [phone], Colonoscopy, flexible; with biopsy, single                            or multiple Diagnosis Code(s):        --- Professional ---                           K63.89, Other specified diseases of intestine                           R19.5, Other fecal abnormalities                           D50.9, Iron deficiency anemia, unspecified CPT copyright 2016 [employer]. All rights reserved. The codes documented in this report are preliminary and upon coder review may  be revised to meet current compliance requirements. Carol Ada, MD Carol Ada, MD 05/27/2016 10:01:51 AM This report has been signed electronically. Number of Addenda: 0

## 2016-05-27 NOTE — Progress Notes (Signed)
Patient ingested givens capsule for small bowel study 05/27/2016 at 0830. Patient will wear equipment until 05/27/2016 at 2030. Written instructions reviewed with patient wife and primary RN. They express understanding and agree. Copies given to patient and nurse.

## 2016-05-27 NOTE — Progress Notes (Signed)
Patient transferred via W/C with belongings with wife and family in attendance to [address].  Report called to receiving RN.

## 2016-05-27 NOTE — Transfer of Care (Signed)
Immediate Anesthesia Transfer of Care Note  Patient: William [name]  Procedure(s) Performed: Procedure(s): COLONOSCOPY (N/A) GIVENS CAPSULE STUDY (N/A)  Patient Location: Endoscopy Unit  Anesthesia Type:MAC  Level of Consciousness: alert , oriented, drowsy and patient cooperative  Airway & Oxygen Therapy: Patient Spontanous Breathing and Patient connected to nasal cannula oxygen  Post-op Assessment: Report given to RN, Post -op Vital signs reviewed and stable and Patient moving all extremities X 4  Post vital signs: Reviewed and stable  Last Vitals:  Vitals:   05/27/16 0322 05/27/16 0700  BP: 123/61 (!) 148/63  Pulse: 82 86  Resp: 20 20  Temp: 37.4 C     Last Pain:  Vitals:   05/27/16 0700  TempSrc: Oral  PainSc:          Complications: No apparent anesthesia complications

## 2016-05-27 NOTE — Anesthesia Procedure Notes (Signed)
Procedure Name: MAC Date/Time: 05/27/2016 7:28 AM Performed by: Everlean Cherry A Pre-anesthesia Checklist: Patient identified, Emergency Drugs available, Suction available and Patient being monitored Patient Re-evaluated:Patient Re-evaluated prior to inductionOxygen Delivery Method: Nasal cannula

## 2016-05-27 NOTE — Progress Notes (Signed)
Progress Note  Patient Name: William [name] Date of Encounter: 05/27/2016  Primary Cardiologist: Dr. Claiborne Billings  Subjective   Patient is feeling well; denies chest pain, SOB, and palpitations.   Inpatient Medications    Scheduled Meds: . insulin aspart  0-5 Units Subcutaneous QHS  . insulin aspart  0-9 Units Subcutaneous TID WC  . iron polysaccharides  150 mg Oral Daily  . metoprolol tartrate  12.5 mg Oral BID  . potassium chloride  40 mEq Oral BID  . sodium chloride flush  3 mL Intravenous Q12H   Continuous Infusions: . sodium chloride Stopped (05/27/16 1102)   PRN Meds: acetaminophen **OR** acetaminophen, hydrALAZINE, ondansetron **OR** ondansetron (ZOFRAN) IV   Vital Signs    Vitals:   05/27/16 0810 05/27/16 0820 05/27/16 0900 05/27/16 0928  BP: 129/66 140/65  122/69  Pulse: 81 81 80 78  Resp: (!) 26 17 (!) 23 (!) 25  Temp:    98 F (36.7 C)  TempSrc:    Oral  SpO2: 97% 100% 97% 97%  Weight:      Height:        Intake/Output Summary (Last 24 hours) at 05/27/16 1203 Last data filed at 05/27/16 0910  Gross per 24 hour  Intake              864 ml  Output             1325 ml  Net             -461 ml   Filed Weights   05/25/16 0523 05/26/16 0416 05/27/16 0322  Weight: 270 lb 14.4 oz (122.9 kg) 278 lb 6.4 oz (126.3 kg) 275 lb 8 oz (125 kg)     Physical Exam   General: Well developed, well nourished, male appearing in no acute distress. Head: Normocephalic, atraumatic.  Neck: Supple without bruits, no JVD Lungs:  Resp regular and unlabored, CTA. Heart: RRR, S1, S2, systolic murmur; no rub. Abdomen: Soft, non-tender, non-distended with normoactive bowel sounds. No hepatomegaly. No rebound/guarding. No obvious abdominal masses. Extremities: No clubbing, cyanosis, 1+ - 2+ edema. Distal pedal pulses are 1+ bilaterally, legs with compression socks Neuro: Alert and oriented X 3. Moves all extremities spontaneously. Psych: Normal affect.  Labs     Chemistry Recent Labs Lab 05/25/16 0358 05/26/16 0307 05/26/16 1037 05/27/16 0337  NA 136 137  --  137  K 3.2* 3.3*  --  3.9  CL 102 104  --  105  CO2 25 24  --  23  GLUCOSE 97 91  --  79  BUN 10 6  --  6  CREATININE 1.08 0.92  --  0.92  CALCIUM 8.5* 8.3*  --  8.4*  PROT 6.2* 6.0*  --  6.5  ALBUMIN 2.8* 2.6*  --  2.7*  AST 30 31  --  33  ALT 23 23  --  23  ALKPHOS 65 65  --  69  BILITOT 7.7* 7.9* 8.2* 8.6*  GFRNONAA >60 >60  --  >60  GFRAA >60 >60  --  >60  ANIONGAP 9 9  --  9     Hematology Recent Labs Lab 05/25/16 1903 05/26/16 0307 05/26/16 1037 05/27/16 0337  WBC 5.9 5.6  --  6.6  RBC 3.29* 3.04*  --  3.52*  HGB 8.6* 7.8*  --  9.4*  HCT 27.1* 25.0*  --  29.0*  MCV 82.4 82.2  --  82.4  MCH 26.1 25.7*  --  26.7  MCHC 31.7 31.2  --  32.4  RDW 16.1* 16.3*  --  16.8*  PLT 97* 94* 88* 107*    Cardiac Enzymes Recent Labs Lab 05/22/16 1434  TROPONINI 0.11*    Recent Labs Lab 05/22/16 0819  TROPIPOC 0.07     BNP Recent Labs Lab 05/22/16 0713 05/26/16 0307  BNP 765.6* 343.4*     DDimer  Recent Labs Lab 05/26/16 1037  DDIMER 3.37*     Radiology    No results found.   Telemetry    NSR in the 80s - Personally Reviewed  ECG    No new tracings - Personally Reviewed   Cardiac Studies   05/19/2016  ECHO Study Conclusions  - Left ventricle: The cavity size was normal. Wall thickness was increased in a pattern of mild LVH. Systolic function was normal. The estimated ejection fraction was in the range of 60% to 65%. Wall motion was normal; there were no regional wall motion abnormalities. Doppler parameters are consistent with pseudonormal left ventricular relaxation (grade 2 diastolic dysfunction). The E/e&' ratio is >15, suggesting elevated LV filling pressure. - Aortic valve: Mildly calcified leaflets. Mild stenosis. Mean gradient (S): 23 mm Hg. Peak gradient (S): 36 mm Hg. Valve area (VTI): 1.77 cm^2. Valve  area (Vmax): 1.82 cm^2. Valve area (Vmean): 1.68 cm^2. - Aorta: Dilated aortic root and ascending aorta. Aortic root dimension: 41 mm (ED). Ascending aortic diameter: 42 mm (S). - Mitral valve: Mildly thickened leaflets . There was trivial regurgitation. - Left atrium: The atrium was mildly dilated. - Right atrium: The atrium was mildly dilated. - Tricuspid valve: There was mild regurgitation. - Pulmonary arteries: PA peak pressure: 28 mm Hg (S). - Inferior vena cava: The vessel was normal in size. The respirophasic diameter changes were in the normal range (>= 50%), consistent with normal central venous pressure.  Impressions:  - LVEF 60-65%, mild LVH, normal wall motion, grade 2 DD with elevated LV filling pressure, mild aortic stenosis - AVA around 1.8 cm2 based on LVOT diameter of 2.2 cm, 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 and ascending aorta to 4.2 cm, trivial MR, mild biatrial enlargement, mild TR, RVSP 28 mmHg, normal IVC.   Myoview 05/04/16  The left ventricular ejection fraction is hyperdynamic (>65%).  Nuclear stress EF: 66%.  No T wave inversion was noted during stress.  There was no ST segment deviation noted during stress.  This is a low risk study.   Normal perfusion. LVEF 66% with normal wall motion. This is a low risk study.  Patient Profile     66 y.o. male with a past medical history significant for coronary artery disease s/p CABG 2008 (LIMA to the LAD, SVG to the RCA, SVG to the circumflex ), PAD, AAA, diabetes mellitus type 2 on metformin monotherapy, hyperlipidemia intolerant to statins, probable fatty liver, hypertension admitted with lower extremity edema, orthopnea, 16 pound weight gain and abnormal ECG in the setting of severe anemia.    Assessment & Plan    1. Acute blood loss/iron deficiency anemia; s/p  PRBC txs. Guiac positive, Ferritin 8  Fe sat 4%.   No significant increase in Hb despite additional unit transfusion.    Received  furosemide prior to 3rd  Transfusion.  He received a 4 th unit PRBC  without furosemide according to wife and patient; H/H  continued to be  reduced at 7.8/25.0     Endoscopy did not reveal abnormality. IV iron yesterday. Colonoscopy did not reveal definitive  bleeding source. - Hb improving, 9.4 (7.8) today.  2. Acute on chronic diastolic heart failure.  Mild troponin increase is probably due to demand ischemia. Good diuresis - he is net negative 2.2L with 1.4 L urine output yesterday.  275 lb today (277 lbs on admission).    - lower extremity edema, recommend restarting diuretic regimen  3 CAD; s/p CABG 03/2006; suspect anemia related ischemia; currently no chest pain and ECG improved on 4/16, repeat EKG on 05/25/16 with ST changes in I, AVL and V5  4. Essential HTN; sBP 100-140s, continue lopressor; HR 80s  5. DM   6. Probable fatty liver  7. Hyperlipidemia with inability to take numerous statins, with LFT elevation  under evaluation for Repatha approval  8. Mild AS on recent echo with mean gradient 23, peak 36 mm Hg; AVA ~1.8 cm2  9. Mild AAA  10. Mild LE edema  11. Stage 3 CKD  Avoid ACE-I/ARB for now;   Cr was 0.98 11/2015 and 1.22 03/2016.  Now improving Cr 1.62 >1.33>1.08 > 0.92 t> 0.92 today.   12 Hypokalemia, resolved, K now 3.9, continue daily BMP   Signed, Ledora Bottcher , PA-C 12:03 PM 05/27/2016 Pager: [phone]   Patient seen and examined. Agree with assessment and plan. Colonoscopy performed today revealed erythematous mucosa in the ascending colon and in the cecum which was biopsied. Pt has received 5 units of PRBC since admission.  Hb increased to 9.4 today, platelets 107K.  I/O -2234 since admission. Feels much better following iv furosemide yesterday with transfusion.  Getting capsule endoscopy presently.    Troy Sine, MD, [facility] 05/27/2016 3:40 PM

## 2016-05-27 NOTE — Interval H&P Note (Signed)
History and Physical Interval Note:  05/27/2016 7:19 AM  William [name]  has presented today for surgery, with the diagnosis of Anemia  The various methods of treatment have been discussed with the patient and family. After consideration of risks, benefits and other options for treatment, the patient has consented to  Procedure(s): COLONOSCOPY (N/A) as a surgical intervention .  The patient's history has been reviewed, patient examined, no change in status, stable for surgery.  I have reviewed the patient's chart and labs.  Questions were answered to the patient's satisfaction.     [name]

## 2016-05-27 NOTE — Progress Notes (Signed)
[facility] TEAM 1 - Stepdown/ICU TEAM  KEYMON MCELROY  XQJ:[id number] DOB: 03-Jun-1950 DOA: 05/22/2016 PCP: Tammi Sou, MD    Brief Narrative:  66 yo male w/ history of AAA, diabetes, HTN, microscopic hematuria, and CAD who presented to the ED w/ dyspnea on exertion worsening over 7 days.  The day of admission patient could not even tie his shoes without becoming dyspneic.   In the ED his Hgb was found to be acutely low at 6.3 w/ guaiac + stool. Chest x-ray showed pulmonary edema. Blood work showed acute kidney injury.   Subjective: Feels better;   denies any chest pain or shortness of breath, no weakness .  Assessment & Plan:  GI bleed - Unclear upper or lower, endoscopy 4/18 with no evidence of GI bleed, colonoscopy today with evidence of mild erythema and erosion, but does not explain significant blood loss per GI, so plan for capsule enteroscopy today, management per GI.  Acute blood loss anemia - Fe deficiency  - Improving after PRBC transfusion, today is 9.4  Recent Labs Lab 05/24/16 0200 05/25/16 0358 05/25/16 1903 05/26/16 0307 05/27/16 0337  HGB 8.0* 7.6* 8.6* 7.8* 9.4*    Acute on chronic diastolic CHF - Acute pulmonary edema - With evidence of volume overload most likely related to transfusion, feels better with IV Lasix, -2.2 L since admission, having lower extremity edema, we'll resume on torsemide, was started on 40 mg oral daily (his home doses 60 mg )   Stage 3 CKD  Avoid ACE-I/ARB for now,  Acute kidney injury  1.62 >1.33>1.08 >0.92 Corrected w/ volume expansion/transfusion - avoid contrast/ACEi/ARB   Fatty liver  Pt has been informed of this in the past per his report - [place] this admit suggests progressive changes c/w cirrhosis w/ portal HTN - discussed link to obesity and need to lose weight - educate further - raises concern UGIB could be due to varicese - further recs per GI   CAD s/p CABG 2008 Cardiology is following   suspect anemia related  ischemia; currently no chest pain and ECG improved on 4/16,   Hypokalemia  Supplement and follow  HTN BP well controlled at present   DM CBG well controlled   Morbid obesity - Body mass index is 40.68 kg/m.   DVT prophylaxis: SCDs Code Status: FULL CODE Family Communication: Spoke with multiple family members at bedside  Disposition Plan: We'll transfer to telemetry floor today   Consultants:  GI Cardiology  Procedures: EGD, colonoscopy, currently receiving capsule endoscopy  Antimicrobials:  none   Objective: Blood pressure (!) 112/47, pulse 71, temperature 98.1 F (36.7 C), temperature source Oral, resp. rate (!) 0, height 5' 9"  (1.753 m), weight 125 kg (275 lb 8 oz), SpO2 99 %.  Intake/Output Summary (Last 24 hours) at 05/27/16 1640 Last data filed at 05/27/16 0910  Gross per 24 hour  Intake              480 ml  Output              150 ml  Net              330 ml   Filed Weights   05/25/16 0523 05/26/16 0416 05/27/16 0322  Weight: 122.9 kg (270 lb 14.4 oz) 126.3 kg (278 lb 6.4 oz) 125 kg (275 lb 8 oz)    Examination: General: Laying comfortably in bed in no apparent distress   Lungs: Good air entry bilaterally, clear to auscultation, no wheezing  Cardiovascular: RRR - 2/6 holosystolic M - no rub  Abdomen: Nontender, nondistended, obese, soft, bowel sounds positive, no rebound, no ascites, no appreciable mass Extremities: 1+ B LE edema persists   CBC:  Recent Labs Lab 05/22/16 0713 05/22/16 1810  05/24/16 0200 05/25/16 0358 05/25/16 1903 05/26/16 0307 05/26/16 1037 05/27/16 0337  WBC 7.6 7.2  < > 6.3 6.0 5.9 5.6  --  6.6  NEUTROABS 3.5 3.5  --   --  3.2  --   --   --   --   HGB 6.3* 7.5*  < > 8.0* 7.6* 8.6* 7.8*  --  9.4*  HCT 20.1* 24.0*  < > 25.1* 24.6* 27.1* 25.0*  --  29.0*  MCV 80.1 80.8  < > 80.4 81.2 82.4 82.2  --  82.4  PLT 152 137*  < > 95* 92* 97* 94* 88* 107*  < > = values in this interval not displayed. Basic Metabolic  Panel:  Recent Labs Lab 05/22/16 1434 05/23/16 0259 05/24/16 0200 05/25/16 0358 05/26/16 0307 05/27/16 0337  NA  --  137 137 136 137 137  K  --  4.2 3.5 3.2* 3.3* 3.9  CL  --  102 103 102 104 105  CO2  --  28 24 25 24 23   GLUCOSE  --  95 93 97 91 79  BUN  --  25* 18 10 6 6   CREATININE  --  1.62* 1.33* 1.08 0.92 0.92  CALCIUM  --  9.0 8.6* 8.5* 8.3* 8.4*  MG 2.3  --   --  2.1 2.1  --   PHOS 4.3  --   --   --   --   --    GFR: Estimated Creatinine Clearance: 103.2 mL/min (by C-G formula based on SCr of 0.92 mg/dL).  Liver Function Tests:  Recent Labs Lab 05/23/16 1814 05/24/16 0200 05/25/16 0358 05/26/16 0307 05/26/16 1037 05/27/16 0337  AST 40 31 30 31   --  33  ALT 28 25 23 23   --  23  ALKPHOS 71 65 65 65  --  69  BILITOT 7.6* 7.5* 7.7* 7.9* 8.2* 8.6*  PROT 7.0 6.7 6.2* 6.0*  --  6.5  ALBUMIN 3.0* 2.9* 2.8* 2.6*  --  2.7*    Coagulation Profile:  Recent Labs Lab 05/22/16 0653 05/23/16 0259 05/23/16 1709 05/26/16 1037  INR 1.74 1.77 1.78 1.97    Cardiac Enzymes:  Recent Labs Lab 05/22/16 1434  TROPONINI 0.11*    HbA1C: Hgb A1c MFr Bld  Date/Time Value Ref Range Status  04/01/2016 09:52 AM 6.7 (H) 4.6 - 6.5 % Final    Comment:    Glycemic Control Guidelines for People with Diabetes:Non Diabetic:  <6%Goal of Therapy: <7%Additional Action Suggested:  >8%   12/02/2015 10:20 AM 6.6 (H) 4.6 - 6.5 % Final    Comment:    Glycemic Control Guidelines for People with Diabetes:Non Diabetic:  <6%Goal of Therapy: <7%Additional Action Suggested:  >8%     CBG:  Recent Labs Lab 05/26/16 1229 05/26/16 1600 05/26/16 2113 05/27/16 0927 05/27/16 1240  GLUCAP 120* 135* 102* 102* 101*    Recent Results (from the past 240 hour(s))  MRSA PCR Screening     Status: None   Collection Time: 05/22/16  1:26 PM  Result Value Ref Range Status   MRSA by PCR NEGATIVE NEGATIVE Final    Comment:        The GeneXpert MRSA Assay (FDA approved for NASAL  specimens only),  is one component of a comprehensive MRSA colonization surveillance program. It is not intended to diagnose MRSA infection nor to guide or monitor treatment for MRSA infections.      Scheduled Meds: . insulin aspart  0-5 Units Subcutaneous QHS  . insulin aspart  0-9 Units Subcutaneous TID WC  . iron polysaccharides  150 mg Oral Daily  . metoprolol tartrate  12.5 mg Oral BID  . potassium chloride  40 mEq Oral BID  . sodium chloride flush  3 mL Intravenous Q12H     LOS: 5 days    [facility]  Pager [phone] [facility] Office  [phone] Pager - Text Page per Shea Evans as per below:    If 7PM-7AM, please contact night-coverage [email or web address] Password [email or web address] 05/27/2016, 4:40 PM

## 2016-05-27 NOTE — H&P (View-Only) (Signed)
Subjective: Feeling well.  No complaints.  No overt evidence of GI bleeding.  Objective: Vital signs in last 24 hours: Temp:  [97.8 F (36.6 C)-98.6 F (37 C)] 98.3 F (36.8 C) (04/19 1226) Pulse Rate:  [81-105] 81 (04/19 1226) Resp:  [10-26] 22 (04/19 1226) BP: (102-146)/(45-68) 113/68 (04/19 1226) SpO2:  [89 %-100 %] 95 % (04/19 1226) Weight:  [126.3 kg (278 lb 6.4 oz)] 126.3 kg (278 lb 6.4 oz) (04/19 0416) Last BM Date: 05/25/16  Intake/Output from previous day: 04/18 0701 - 04/19 0700 In: 1040 [P.O.:240; I.V.:200; Blood:335; IV Piggyback:265] Out: 825 [Urine:825] Intake/Output this shift: Total I/O In: -  Out: 575 [Urine:575]  General appearance: alert and no distress GI: soft, non-tender; bowel sounds normal; no masses,  no organomegaly and mild hepatojugular reflux  Lab Results:  Recent Labs  05/25/16 0358 05/25/16 1903 05/26/16 0307 05/26/16 1037  WBC 6.0 5.9 5.6  --   HGB 7.6* 8.6* 7.8*  --   HCT 24.6* 27.1* 25.0*  --   PLT 92* 97* 94* 88*   BMET  Recent Labs  05/24/16 0200 05/25/16 0358 05/26/16 0307  NA 137 136 137  K 3.5 3.2* 3.3*  CL 103 102 104  CO2 24 25 24   GLUCOSE 93 97 91  BUN 18 10 6   CREATININE 1.33* 1.08 0.92  CALCIUM 8.6* 8.5* 8.3*   LFT  Recent Labs  05/26/16 0307 05/26/16 1037  PROT 6.0*  --   ALBUMIN 2.6*  --   AST 31  --   ALT 23  --   ALKPHOS 65  --   BILITOT 7.9* 8.2*  BILIDIR  --  1.3*  IBILI  --  6.9*   PT/INR  Recent Labs  05/23/16 1709 05/26/16 1037  LABPROT 20.9* 22.7*  INR 1.78 1.97   Hepatitis Panel No results for input(s): HEPBSAG, HCVAB, HEPAIGM, HEPBIGM in the last 72 hours. C-Diff No results for input(s): CDIFFTOX in the last 72 hours. Fecal Lactopherrin No results for input(s): FECLLACTOFRN in the last 72 hours.  Studies/Results: US Abdomen Complete W/elastography  Result Date: 05/24/2016 CLINICAL DATA:  Nonalcoholic steatohepatitis. Elevated liver function tests. Abdominal pain. EXAM:  ULTRASOUND ABDOMEN ULTRASOUND HEPATIC ELASTOGRAPHY TECHNIQUE: Sonography of the upper abdomen was performed. In addition, ultrasound elastography evaluation of the liver was performed. A region of interest was placed within the right lobe of the liver. Following application of a compressive sonographic pulse, shear waves were detected in the adjacent hepatic tissue and the shear wave velocity was calculated. Multiple assessments were performed at the selected site. Median shear wave velocity is correlated to a Metavir fibrosis score. COMPARISON:  03/15/2013 MRI abdomen.  03/08/2013 CT abdomen/pelvis. FINDINGS: ULTRASOUND ABDOMEN Gallbladder: Numerous layering subcentimeter calcified gallstones. Mild gallbladder wall thickening. No pericholecystic fluid. No sonographic Murphy's sign. Common bile duct: Diameter: 4 mm Liver: Diffusely irregular liver surface and coarsened liver parenchymal echotexture, compatible with cirrhosis. Liver parenchyma is diffusely mildly echogenic, which may indicate hepatic steatosis. No liver mass detected. IVC: No abnormality visualized. Pancreas: Poorly visualized due to patient body habitus and overlying bowel gas. Spleen: Mildly enlarged spleen (craniocaudal splenic length 13.0 cm). No splenic mass. Splenic dimensions 13.0 x 5.2 x 14.5 cm (volume = 510 cm^3). Right Kidney: Length: 13.1 cm. Normal right renal parenchymal echogenicity and thickness. No right hydronephrosis. Simple 4.6 x 3.9 x 3.5 cm lateral interpolar right renal cyst. Left Kidney: Length: 12.5 cm. Echogenicity within normal limits. No mass or hydronephrosis visualized. Abdominal aorta: No aneurysm visualized.  Other findings: Small volume ascites throughout all 4 quadrants of the abdomen. ULTRASOUND HEPATIC ELASTOGRAPHY Device: Siemens Helix VTQ Patient position: Supine/ Left lateral decubitus Transducer 4V1 Number of measurements: 4 Hepatic segment:  8 Limitations of exam: Unable to obtain elastography results due to  significant liver motion within the ascites, which persisted even with suspended breathing. Please note that abnormal shear wave velocities may also be identified in clinical settings other than with hepatic fibrosis, such as: acute hepatitis, elevated right heart and central venous pressures including use of beta blockers, veno-occlusive disease (Budd-Chiari), infiltrative processes such as mastocytosis/amyloidosis/infiltrative tumor, extrahepatic cholestasis, in the post-prandial state, and liver transplantation. Correlation with patient history, laboratory data, and clinical condition recommended. IMPRESSION: ULTRASOUND ABDOMEN: 1. Morphologic changes of hepatic cirrhosis. No liver mass detected. 2. Secondary findings of portal hypertension including small volume ascites and mild splenomegaly. 3. Cholelithiasis. Mild diffuse gallbladder wall thickening. No pericholecystic fluid or sonographic Murphy's sign. Findings are nonspecific. The gallbladder wall thickening may be due to noninflammatory edema (such as due to portal hypertension or hypoalbuminemia). If there is clinical concern for acute cholecystitis, hepatobiliary scintigraphy study may be performed. 4. No biliary ductal dilatation. 5. Simple right renal cyst. ULTRASOUND HEPATIC ELASTOGRAPHY: Technically unsuccessful hepatic elastography. Unable to obtain elastography results despite multiple attempts due to significant liver motion within the ascites, which persisted even with suspended breathing. Electronically Signed   By: Ilona Sorrel M.D.   On: 05/24/2016 18:35    Medications:  Scheduled: . insulin aspart  0-5 Units Subcutaneous QHS  . insulin aspart  0-9 Units Subcutaneous TID WC  . iron polysaccharides  150 mg Oral Daily  . metoprolol tartrate  12.5 mg Oral BID  . potassium chloride  40 mEq Oral BID  . sodium chloride flush  3 mL Intravenous Q12H   Continuous: . sodium chloride    . sodium chloride    . iron dextran  (INFED/DEXFERRRUM) 500 MG IVPB 500 mg (05/26/16 1000)    Assessment/Plan: 1) Anemia - Most likely multifactorial. 2) Diastolic CHF. 3) Thrombocytopenia. 4) Mild coagulopathy. 5) Hyperbilirubinemia.   The patient and his family report that he will receive his 5th unit of blood since his admission, however, there is no overt evidence of GI bleeding.  He does have an iron deficiency and he is heme positive, but I feel that his anemia is multifactorial.  D-Dimer is elevated along with a worsening thrombocytopenia, and mild coagulopathy.  A hemolytic anemia can explain the persistent anemia without overt GI bleeding and the unconjugated hyperbilirubinemia.  Regardless, I will pursue a colonoscopy tomorrow to ensure no colonic bleeding sites.  If the colonoscopy is negative, along with his negative EGD, a capsule endoscopy will be pursued.  The smear is pending at this time.  He does demonstrate a mild hepatojugular reflux and his BNP today was elevated in the 300 range with his diastolic heart failure.  No other liver enzyme abnormalities to correlate with the possibility of hepatic congestion from heart failure.    Plan: 1) Colonoscopy tomorrow. 2) Follow up peripheral smear. 3) Follow HGB.  It may be more beneficial to see if there is a consistent down trend with his HGB before transfusing.  LOS: 4 days   Gerrie Castiglia D 05/26/2016, 12:40 PM

## 2016-05-27 NOTE — Anesthesia Postprocedure Evaluation (Addendum)
Anesthesia Post Note  Patient: William [name]  Procedure(s) Performed: Procedure(s) (LRB): COLONOSCOPY (N/A) GIVENS CAPSULE STUDY (N/A)  Patient location during evaluation: PACU Anesthesia Type: MAC Level of consciousness: awake and alert Pain management: pain level controlled Vital Signs Assessment: post-procedure vital signs reviewed and stable Respiratory status: spontaneous breathing, nonlabored ventilation, respiratory function stable and patient connected to nasal cannula oxygen Cardiovascular status: stable and blood pressure returned to baseline Anesthetic complications: no       Last Vitals:  Vitals:   05/27/16 0810 05/27/16 0820  BP: 129/66 140/65  Pulse: 81 81  Resp: (!) 26 17  Temp:      Last Pain:  Vitals:   05/27/16 0755  TempSrc: Oral  PainSc:                  [name]

## 2016-05-27 NOTE — Progress Notes (Signed)
Patient transfered to colonoscopy.

## 2016-05-27 NOTE — Anesthesia Preprocedure Evaluation (Addendum)
Anesthesia Evaluation  Patient identified by MRN, date of birth, ID band Patient awake    Reviewed: Allergy & Precautions, H&P , NPO status , Patient's Chart, lab work & pertinent test results  Airway Mallampati: I  TM Distance: >3 FB Neck ROM: full    Dental  (+) Teeth Intact, Dental Advidsory Given   Pulmonary former smoker,    breath sounds clear to auscultation       Cardiovascular hypertension, On Medications + CAD and + Peripheral Vascular Disease   Rhythm:Regular     Neuro/Psych    GI/Hepatic GERD  ,(+) Cirrhosis       , Hepatitis -  Endo/Other  diabetes, Type 2  Renal/GU Renal disease     Musculoskeletal   Abdominal   Peds  Hematology  (+) anemia ,   Anesthesia Other Findings   Reproductive/Obstetrics                            Anesthesia Physical Anesthesia Plan  ASA: III  Anesthesia Plan: MAC   Post-op Pain Management:    Induction: Intravenous  Airway Management Planned: Natural Airway, Nasal Cannula and Simple Face Mask  Additional Equipment: None  Intra-op Plan:   Post-operative Plan:   Informed Consent: I have reviewed the patients History and Physical, chart, labs and discussed the procedure including the risks, benefits and alternatives for the proposed anesthesia with the patient or authorized representative who has indicated his/her understanding and acceptance.   Dental Advisory Given  Plan Discussed with: CRNA and Surgeon  Anesthesia Plan Comments:        Anesthesia Quick Evaluation                                   Anesthesia Evaluation  Patient identified by MRN, date of birth, ID band Patient awake    Reviewed: Allergy & Precautions, NPO status , Patient's Chart, lab work & pertinent test results  Airway Mallampati: III  TM Distance: >3 FB Neck ROM: Full    Dental no notable dental hx.    Pulmonary neg pulmonary ROS, former  smoker,    Pulmonary exam normal breath sounds clear to auscultation       Cardiovascular hypertension, + CAD and + Peripheral Vascular Disease  negative cardio ROS Normal cardiovascular exam Rhythm:Regular Rate:Normal     Neuro/Psych negative neurological ROS  negative psych ROS   GI/Hepatic negative GI ROS, (+) Hepatitis -  Endo/Other  diabetes, Type 2Morbid obesity  Renal/GU Renal diseasenegative Renal ROS     Musculoskeletal negative musculoskeletal ROS (+)   Abdominal (+) + obese,   Peds  Hematology negative hematology ROS (+)   Anesthesia Other Findings   Reproductive/Obstetrics negative OB ROS                             Anesthesia Physical Anesthesia Plan  ASA: III  Anesthesia Plan: MAC   Post-op Pain Management:    Induction: Intravenous  Airway Management Planned:   Additional Equipment:   Intra-op Plan:   Post-operative Plan:   Informed Consent: I have reviewed the patients History and Physical, chart, labs and discussed the procedure including the risks, benefits and alternatives for the proposed anesthesia with the patient or authorized representative who has indicated his/her understanding and acceptance.   Dental advisory given  Plan Discussed with:  CRNA  Anesthesia Plan Comments:         Anesthesia Quick Evaluation

## 2016-05-28 ENCOUNTER — Other Ambulatory Visit: Payer: Self-pay

## 2016-05-28 DIAGNOSIS — R748 Abnormal levels of other serum enzymes: Secondary | ICD-10-CM

## 2016-05-28 DIAGNOSIS — D62 Acute posthemorrhagic anemia: Secondary | ICD-10-CM

## 2016-05-28 DIAGNOSIS — D649 Anemia, unspecified: Secondary | ICD-10-CM

## 2016-05-28 LAB — CBC
HEMATOCRIT: 29 % — AB (ref 39.0–52.0)
Hemoglobin: 9.1 g/dL — ABNORMAL LOW (ref 13.0–17.0)
MCH: 25.9 pg — AB (ref 26.0–34.0)
MCHC: 31.4 g/dL (ref 30.0–36.0)
MCV: 82.4 fL (ref 78.0–100.0)
Platelets: 98 10*3/uL — ABNORMAL LOW (ref 150–400)
RBC: 3.52 MIL/uL — AB (ref 4.22–5.81)
RDW: 16.8 % — ABNORMAL HIGH (ref 11.5–15.5)
WBC: 7.9 10*3/uL (ref 4.0–10.5)

## 2016-05-28 LAB — GLUCOSE, CAPILLARY
GLUCOSE-CAPILLARY: 96 mg/dL (ref 65–99)
GLUCOSE-CAPILLARY: 105 mg/dL — AB (ref 65–99)
Glucose-Capillary: 100 mg/dL — ABNORMAL HIGH (ref 65–99)
Glucose-Capillary: 130 mg/dL — ABNORMAL HIGH (ref 65–99)

## 2016-05-28 LAB — BASIC METABOLIC PANEL
Anion gap: 10 (ref 5–15)
BUN: 5 mg/dL — AB (ref 6–20)
CHLORIDE: 102 mmol/L (ref 101–111)
CO2: 22 mmol/L (ref 22–32)
CREATININE: 1.11 mg/dL (ref 0.61–1.24)
Calcium: 8.2 mg/dL — ABNORMAL LOW (ref 8.9–10.3)
GFR calc Af Amer: >60 mL/min (ref >60)
GFR calc non Af Amer: >60 mL/min (ref >60)
Glucose, Bld: 95 mg/dL (ref 65–99)
POTASSIUM: 3.6 mmol/L (ref 3.5–5.1)
Sodium: 134 mmol/L — ABNORMAL LOW (ref 135–145)

## 2016-05-28 LAB — DIRECT ANTIGLOBULIN TEST (NOT AT ARMC)
DAT, COMPLEMENT: NEGATIVE
DAT, IGG: NEGATIVE

## 2016-05-28 LAB — LACTATE DEHYDROGENASE: LDH: 231 U/L — ABNORMAL HIGH (ref 98–192)

## 2016-05-28 NOTE — Progress Notes (Signed)
Pt ambulated in hallway two more times. Each 374f distance. Tolerated well on room air with no assistive devices. Pt walked with wife.  William [name]

## 2016-05-28 NOTE — Progress Notes (Signed)
Capsule Study equipment removed from patient at 2100. Will continue to monitor.

## 2016-05-28 NOTE — Progress Notes (Addendum)
Progress Note  Patient Name: William [name] Date of Encounter: 05/28/2016  Primary Cardiologist: Dr. Claiborne Billings  Subjective   Patient without complaints. He denies any chest pain or SOB   Inpatient Medications    Scheduled Meds: . insulin aspart  0-5 Units Subcutaneous QHS  . insulin aspart  0-9 Units Subcutaneous TID WC  . iron polysaccharides  150 mg Oral Daily  . metoprolol tartrate  12.5 mg Oral BID  . potassium chloride  40 mEq Oral BID  . sodium chloride flush  3 mL Intravenous Q12H  . torsemide  40 mg Oral Daily   Continuous Infusions:  PRN Meds: acetaminophen **OR** acetaminophen, hydrALAZINE, ondansetron **OR** ondansetron (ZOFRAN) IV   Vital Signs    Vitals:   05/27/16 2308 05/28/16 0007 05/28/16 0432 05/28/16 0900  BP: 129/63 118/67 116/62 135/68  Pulse: 100 86 83 82  Resp:  18 18 18   Temp:  98.9 F (37.2 C) 98.8 F (37.1 C)   TempSrc:  Oral Oral   SpO2:  93% 93% 97%  Weight:   268 lb 1.6 oz (121.6 kg)   Height:        Intake/Output Summary (Last 24 hours) at 05/28/16 0943 Last data filed at 05/28/16 0900  Gross per 24 hour  Intake              740 ml  Output             2725 ml  Net            -1985 ml   Filed Weights   05/27/16 0322 05/27/16 1819 05/28/16 0432  Weight: 275 lb 8 oz (125 kg) 274 lb (124.3 kg) 268 lb 1.6 oz (121.6 kg)     Physical Exam   General: WD, WN in NAD Neck: no bruits or JVD Lungs: CTA bilaterally Heart: RRR with no M/R/G Abdomen:soft, NT, ND with active BS Extremities: 1-2+ edema Neuro: A&O x 3 Psych: normal mood and affect.  Labs    Chemistry  Recent Labs Lab 05/25/16 0358 05/26/16 0307 05/26/16 1037 05/27/16 0337 05/28/16 0524  NA 136 137  --  137 134*  K 3.2* 3.3*  --  3.9 3.6  CL 102 104  --  105 102  CO2 25 24  --  23 22  GLUCOSE 97 91  --  79 95  BUN 10 6  --  6 5*  CREATININE 1.08 0.92  --  0.92 1.11  CALCIUM 8.5* 8.3*  --  8.4* 8.2*  PROT 6.2* 6.0*  --  6.5  --   ALBUMIN 2.8* 2.6*  --   2.7*  --   AST 30 31  --  33  --   ALT 23 23  --  23  --   ALKPHOS 65 65  --  69  --   BILITOT 7.7* 7.9* 8.2* 8.6*  --   GFRNONAA >60 >60  --  >60 >60  GFRAA >60 >60  --  >60 >60  ANIONGAP 9 9  --  9 10     Hematology  Recent Labs Lab 05/26/16 0307 05/26/16 1037 05/27/16 0337 05/28/16 0524  WBC 5.6  --  6.6 7.9  RBC 3.04*  --  3.52* 3.52*  HGB 7.8*  --  9.4* 9.1*  HCT 25.0*  --  29.0* 29.0*  MCV 82.2  --  82.4 82.4  MCH 25.7*  --  26.7 25.9*  MCHC 31.2  --  32.4 31.4  RDW  16.3*  --  16.8* 16.8*  PLT 94* 88* 107* 98*    Cardiac Enzymes  Recent Labs Lab 05/22/16 1434  TROPONINI 0.11*     Recent Labs Lab 05/22/16 0819  TROPIPOC 0.07     BNP  Recent Labs Lab 05/22/16 0713 05/26/16 0307  BNP 765.6* 343.4*     DDimer   Recent Labs Lab 05/26/16 1037  DDIMER 3.37*     Radiology    No results found.   Telemetry    NSR- I have  Personally Reviewed  ECG    No new EKGs  to review   Cardiac Studies   05/19/2016  ECHO Study Conclusions  - Left ventricle: The cavity size was normal. Wall thickness was increased in a pattern of mild LVH. Systolic function was normal. The estimated ejection fraction was in the range of 60% to 65%. Wall motion was normal; there were no regional wall motion abnormalities. Doppler parameters are consistent with pseudonormal left ventricular relaxation (grade 2 diastolic dysfunction). The E/e&' ratio is >15, suggesting elevated LV filling pressure. - Aortic valve: Mildly calcified leaflets. Mild stenosis. Mean gradient (S): 23 mm Hg. Peak gradient (S): 36 mm Hg. Valve area (VTI): 1.77 cm^2. Valve area (Vmax): 1.82 cm^2. Valve area (Vmean): 1.68 cm^2. - Aorta: Dilated aortic root and ascending aorta. Aortic root dimension: 41 mm (ED). Ascending aortic diameter: 42 mm (S). - Mitral valve: Mildly thickened leaflets . There was trivial regurgitation. - Left atrium: The atrium was mildly  dilated. - Right atrium: The atrium was mildly dilated. - Tricuspid valve: There was mild regurgitation. - Pulmonary arteries: PA peak pressure: 28 mm Hg (S). - Inferior vena cava: The vessel was normal in size. The respirophasic diameter changes were in the normal range (>= 50%), consistent with normal central venous pressure.  Impressions:  - LVEF 60-65%, mild LVH, normal wall motion, grade 2 DD with elevated LV filling pressure, mild aortic stenosis - AVA around 1.8 cm2 based on LVOT diameter of 2.2 cm, 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 and ascending aorta to 4.2 cm, trivial MR, mild biatrial enlargement, mild TR, RVSP 28 mmHg, normal IVC.   Myoview 05/04/16  The left ventricular ejection fraction is hyperdynamic (>65%).  Nuclear stress EF: 66%.  No T wave inversion was noted during stress.  There was no ST segment deviation noted during stress.  This is a low risk study.   Normal perfusion. LVEF 66% with normal wall motion. This is a low risk study.  Patient Profile     66 y.o. male with a past medical history significant for coronary artery disease s/p CABG 2008 (LIMA to the LAD, SVG to the RCA, SVG to the circumflex ), PAD, AAA, diabetes mellitus type 2 on metformin monotherapy, hyperlipidemia intolerant to statins, probable fatty liver, hypertension admitted with lower extremity edema, orthopnea, 16 pound weight gain and abnormal ECG in the setting of severe anemia.    Assessment & Plan    1. Acute blood loss/iron deficiency anemia; s/p  PRBC txs. Guiac positive, Ferritin 8  Fe sat 4%.   Hbg is 9.1 after additional unit transfusion.   Endoscopy did not reveal abnormality.  Colonoscopy did not reveal definitive bleeding source.  Results of capsule enteroscopy are pending.   2. Acute on chronic diastolic heart failure.  Mild troponin increase  At 0,.11 likely related to demand ischemia. Good diuresis - he is net negative 2L overnight and net neg 4.2L   Weight  268 lb today (  277 lbs on admission).    - continue diuretics - home dose of Torsemide (he was on 46m daily maintenance at home) now on 467mdaily and diuresing well so will continue to follow for now.   3 CAD; s/p CABG 03/2006; suspect anemia related ischemia; currently no chest pain and ECG improved on 4/16, repeat EKG on 05/25/16 with ST changes in I, AVL and V5  4. Essential HTN - BP well controlled on current meds.  Will continue on BB.  5. Hyperlipidemia with inability to take numerous statins, with LFT elevation  under evaluation for Repatha approval  6. Mild AS on recent echo with mean gradient 23, peak 36 mm Hg; AVA ~1.8 cm2  7. Stage 3 CKD  Avoid ACE-I/ARB for now;   Cr was 0.98 11/2015 and 1.22 03/2016.  Now improving Cr 1.62 >1.33>1.08 > 0.92 > 0.92 > 1.1 today.   8. Hypokalemia, resolved, K now 3.6, continue daily BMP   Signed, TrFransico Him PA-C 9:43 AM 05/28/2016 Pager: [phone]

## 2016-05-28 NOTE — Progress Notes (Signed)
PROGRESS NOTE        PATIENT DETAILS Name: William [name] Age: 66 y.o. Sex: male Date of Birth: 1950-03-24 Admit Date: 05/22/2016 Admitting Physician Elwin Mocha, MD XKG:YJEHUDJ,SHFWYO H, MD  Brief Narrative: Patient is a 66 y.o. male with history of diabetes, hypertension, CAD status post CABG in 2008,AAA admitted with symptomatic anemia. Admitted and transfused PRBC, underwent EGD/colonoscopy without obvious cause of blood loss, awaiting results of capsule endoscopy. Hospital course complicated by acute on chronic diastolic heart failure. See below for further details.  Subjective: No chest pain or shortness of breath. He feels much better.  Assessment/Plan: Suspected occult GI bleeding: Claims to have brown stools-no overt bleeding seen on colonoscopy/endoscopy. Underwent capsule endoscopy on 4/20-await results.  Acute blood loss anemia: Probably secondary to above, transfused multiple units of PRBC-hemoglobin currently stable. Although, likely to be blood loss anemia-his iron panel is suggestive of iron deficiency-he has has significant elevation beyond his usual baseline-hence will check LDH, haptoglobin levels. Follow CBC.  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 Although anemia is probably likely secondary to blood loss, will need make sure he does not have underlying hemolysis- will check LDH, haptoglobin levels, Coomb's test. He did have mild elevation in his PT/INR, d-dimer levels on admission, fibrinogen levels were borderline low.  Thrombocytopenia: I suspect this is probably due to underlying liver cirrhosis-ultrasound abdomen this admission does suggest liver cirrhosis. However we will rule out hemolytic anemia-see above.  Liver Cirrhoses/NAFLD: Seen on abdominal ultrasound-per GI  note-known history of nonalcoholic fatty liver disease.  Acute on chronic diastolic heart failure: Slowly improving-still has excessive volume on board--4.2 L so far, weight decreased to 268 pounds (277 pounds on admission). Cardiology following and directing diuretic dosing.  History of CAD status post CABG in 2008  Hypertension: Controlled with metoprolol, and Demadex.  DM-2: CBGs stable with SSI-resume metformin on discharge  DVT Prophylaxis: SCD's  Code Status: Full code   Family Communication: Spouse at bedside  Disposition Plan: Remain inpatient-home when work up complete  Antimicrobial agents: Anti-infectives    None     Procedures: Capsule endoscopy 4/20>> pending results Colonoscopy 4/20>>Erythematous mucosa in the ascending colon and in the cecum.  EGD 4/18>> no major abnormality  CONSULTS:  cardiology and GI  Time spent: 25- minutes-Greater than 50% of this time was spent in counseling, explanation of diagnosis, planning of further management, and coordination of care.  MEDICATIONS: Scheduled Meds: . insulin aspart  0-5 Units Subcutaneous QHS  . insulin aspart  0-9 Units Subcutaneous TID WC  . iron polysaccharides  150 mg Oral Daily  . metoprolol tartrate  12.5 mg Oral BID  . potassium chloride  40 mEq Oral BID  . sodium chloride flush  3 mL Intravenous Q12H  . torsemide  40 mg Oral Daily   Continuous Infusions: PRN Meds:.acetaminophen **OR** acetaminophen, hydrALAZINE, ondansetron **OR** ondansetron (ZOFRAN) IV   PHYSICAL EXAM: Vital signs: Vitals:   05/27/16 2308 05/28/16 0007 05/28/16 0432 05/28/16 0900  BP: 129/63 118/67 116/62 135/68  Pulse: 100 86 83 82  Resp:  18 18 18   Temp:  98.9 F (37.2 C) 98.8 F (37.1 C)   TempSrc:  Oral Oral   SpO2:  93% 93% 97%  Weight:   121.6 kg (268 lb  1.6 oz)   Height:       Filed Weights   05/27/16 0322 05/27/16 1819 05/28/16 0432  Weight: 125 kg (275 lb 8 oz) 124.3 kg (274 lb) 121.6 kg (268 lb 1.6  oz)   Body mass index is 46.02 kg/m.   General appearance :Awake, alert, not in any distress. Speech Clear. Not toxic Looking Eyes:, pupils equally reactive to light and accomodation,no scleral icterus.Pink conjunctiva HEENT: Atraumatic and Normocephalic Neck: supple, no JVD. No cervical lymphadenopathy. No thyromegaly Resp:Good air entry bilaterally, no added sounds  CVS: S1 S2 regular, + syst murmurs.  GI: Bowel sounds present, Non tender and not distended with no gaurding, rigidity or rebound. Extremities: B/L Lower Ext shows ++ edema, both legs are warm to touch Neurology:  speech clear,Non focal, sensation is grossly intact. Psychiatric: Normal judgment and insight. Alert and oriented x 3.  Musculoskeletal:No digital cyanosis Skin:No Rash, warm and dry Wounds:N/A  I have personally reviewed following labs and imaging studies  LABORATORY DATA: CBC:  Recent Labs Lab 05/22/16 0713 05/22/16 1810  05/25/16 0358 05/25/16 1903 05/26/16 0307 05/26/16 1037 05/27/16 0337 05/28/16 0524  WBC 7.6 7.2  < > 6.0 5.9 5.6  --  6.6 7.9  NEUTROABS 3.5 3.5  --  3.2  --   --   --   --   --   HGB 6.3* 7.5*  < > 7.6* 8.6* 7.8*  --  9.4* 9.1*  HCT 20.1* 24.0*  < > 24.6* 27.1* 25.0*  --  29.0* 29.0*  MCV 80.1 80.8  < > 81.2 82.4 82.2  --  82.4 82.4  PLT 152 137*  < > 92* 97* 94* 88* 107* 98*  < > = values in this interval not displayed.  Basic Metabolic Panel:  Recent Labs Lab 05/22/16 1434  05/24/16 0200 05/25/16 0358 05/26/16 0307 05/27/16 0337 05/28/16 0524  NA  --   < > 137 136 137 137 134*  K  --   < > 3.5 3.2* 3.3* 3.9 3.6  CL  --   < > 103 102 104 105 102  CO2  --   < > 24 25 24 23 22   GLUCOSE  --   < > 93 97 91 79 95  BUN  --   < > 18 10 6 6  5*  CREATININE  --   < > 1.33* 1.08 0.92 0.92 1.11  CALCIUM  --   < > 8.6* 8.5* 8.3* 8.4* 8.2*  MG 2.3  --   --  2.1 2.1  --   --   PHOS 4.3  --   --   --   --   --   --   < > = values in this interval not  displayed.  GFR: Estimated Creatinine Clearance: 78 mL/min (by C-G formula based on SCr of 1.11 mg/dL).  Liver Function Tests:  Recent Labs Lab 05/23/16 1814 05/24/16 0200 05/25/16 0358 05/26/16 0307 05/26/16 1037 05/27/16 0337  AST 40 31 30 31   --  33  ALT 28 25 23 23   --  23  ALKPHOS 71 65 65 65  --  69  BILITOT 7.6* 7.5* 7.7* 7.9* 8.2* 8.6*  PROT 7.0 6.7 6.2* 6.0*  --  6.5  ALBUMIN 3.0* 2.9* 2.8* 2.6*  --  2.7*   No results for input(s): LIPASE, AMYLASE in the last 168 hours. No results for input(s): AMMONIA in the last 168 hours.  Coagulation Profile:  Recent Labs Lab 05/22/16  6812 05/23/16 0259 05/23/16 1709 05/26/16 1037  INR 1.74 1.77 1.78 1.97    Cardiac Enzymes:  Recent Labs Lab 05/22/16 1434  TROPONINI 0.11*    BNP (last 3 results) No results for input(s): PROBNP in the last 8760 hours.  HbA1C: No results for input(s): HGBA1C in the last 72 hours.  CBG:  Recent Labs Lab 05/27/16 1240 05/27/16 1721 05/27/16 2200 05/28/16 0756 05/28/16 1131  GLUCAP 101* 129* 109* 100* 130*    Lipid Profile: No results for input(s): CHOL, HDL, LDLCALC, TRIG, CHOLHDL, LDLDIRECT in the last 72 hours.  Thyroid Function Tests: No results for input(s): TSH, T4TOTAL, FREET4, T3FREE, THYROIDAB in the last 72 hours.  Anemia Panel: No results for input(s): VITAMINB12, FOLATE, FERRITIN, TIBC, IRON, RETICCTPCT in the last 72 hours.  Urine analysis:    Component Value Date/Time   COLORURINE YELLOW 05/22/2016 [place] 05/22/2016 0918   LABSPEC 1.010 05/22/2016 0918   PHURINE 5.0 05/22/2016 0918   GLUCOSEU NEGATIVE 05/22/2016 0918   HGBUR NEGATIVE 05/22/2016 0918   BILIRUBINUR NEGATIVE 05/22/2016 0918   KETONESUR NEGATIVE 05/22/2016 0918   PROTEINUR 100 (A) 05/22/2016 0918   NITRITE NEGATIVE 05/22/2016 0918   LEUKOCYTESUR NEGATIVE 05/22/2016 0918    Sepsis Labs: Lactic Acid, Venous No results found for:  LATICACIDVEN  MICROBIOLOGY: Recent Results (from the past 240 hour(s))  MRSA PCR Screening     Status: None   Collection Time: 05/22/16  1:26 PM  Result Value Ref Range Status   MRSA by PCR NEGATIVE NEGATIVE Final    Comment:        The GeneXpert MRSA Assay (FDA approved for NASAL specimens only), is one component of a comprehensive MRSA colonization surveillance program. It is not intended to diagnose MRSA infection nor to guide or monitor treatment for MRSA infections.     RADIOLOGY STUDIES/RESULTS: Dg Chest 2 View  Result Date: 05/22/2016 CLINICAL DATA:  Shortness of breath.  Lower extremity swelling. EXAM: CHEST  2 VIEW COMPARISON:  Two-view chest x-ray 04/27/2006 FINDINGS: Heart is enlarged. A diffuse interstitial pattern is now present, suggesting edema. There are no significant effusions. Mild bibasilar atelectasis is present. No significant airspace consolidation is present. The visualized soft tissues and bony thorax are unremarkable. IMPRESSION: 1. Cardiomegaly and mild edema compatible with congestive heart failure. 2. Mild bibasilar airspace disease likely reflects atelectasis. Electronically Signed   By: San Morelle M.D.   On: 05/22/2016 09:04   Dg Chest Port 1 View  Result Date: 05/23/2016 CLINICAL DATA:  Pulmonary edema. EXAM: PORTABLE CHEST 1 VIEW COMPARISON:  Radiographs of May 22, 2016. FINDINGS: Stable cardiomegaly. Status post coronary artery bypass graft. Stable bilateral perihilar and basilar opacities are noted concerning for pulmonary edema. No pneumothorax or pleural effusion is noted. Bony thorax is unremarkable. IMPRESSION: Stable bilateral pulmonary edema. Electronically Signed   By: Marijo Conception, M.D.   On: 05/23/2016 07:22   US Abdomen Complete W/elastography  Result Date: 05/24/2016 CLINICAL DATA:  Nonalcoholic steatohepatitis. Elevated liver function tests. Abdominal pain. EXAM: ULTRASOUND ABDOMEN ULTRASOUND HEPATIC ELASTOGRAPHY TECHNIQUE:  Sonography of the upper abdomen was performed. In addition, ultrasound elastography evaluation of the liver was performed. A region of interest was placed within the right lobe of the liver. Following application of a compressive sonographic pulse, shear waves were detected in the adjacent hepatic tissue and the shear wave velocity was calculated. Multiple assessments were performed at the selected site. Median shear wave velocity is correlated to a Metavir fibrosis  score. COMPARISON:  03/15/2013 MRI abdomen.  03/08/2013 CT abdomen/pelvis. FINDINGS: ULTRASOUND ABDOMEN Gallbladder: Numerous layering subcentimeter calcified gallstones. Mild gallbladder wall thickening. No pericholecystic fluid. No sonographic Murphy's sign. Common bile duct: Diameter: 4 mm Liver: Diffusely irregular liver surface and coarsened liver parenchymal echotexture, compatible with cirrhosis. Liver parenchyma is diffusely mildly echogenic, which may indicate hepatic steatosis. No liver mass detected. IVC: No abnormality visualized. Pancreas: Poorly visualized due to patient body habitus and overlying bowel gas. Spleen: Mildly enlarged spleen (craniocaudal splenic length 13.0 cm). No splenic mass. Splenic dimensions 13.0 x 5.2 x 14.5 cm (volume = 510 cm^3). Right Kidney: Length: 13.1 cm. Normal right renal parenchymal echogenicity and thickness. No right hydronephrosis. Simple 4.6 x 3.9 x 3.5 cm lateral interpolar right renal cyst. Left Kidney: Length: 12.5 cm. Echogenicity within normal limits. No mass or hydronephrosis visualized. Abdominal aorta: No aneurysm visualized. Other findings: Small volume ascites throughout all 4 quadrants of the abdomen. ULTRASOUND HEPATIC ELASTOGRAPHY Device: Siemens Helix VTQ Patient position: Supine/ Left lateral decubitus Transducer 4V1 Number of measurements: 4 Hepatic segment:  8 Limitations of exam: Unable to obtain elastography results due to significant liver motion within the ascites, which persisted  even with suspended breathing. Please note that abnormal shear wave velocities may also be identified in clinical settings other than with hepatic fibrosis, such as: acute hepatitis, elevated right heart and central venous pressures including use of beta blockers, veno-occlusive disease (Budd-Chiari), infiltrative processes such as mastocytosis/amyloidosis/infiltrative tumor, extrahepatic cholestasis, in the post-prandial state, and liver transplantation. Correlation with patient history, laboratory data, and clinical condition recommended. IMPRESSION: ULTRASOUND ABDOMEN: 1. Morphologic changes of hepatic cirrhosis. No liver mass detected. 2. Secondary findings of portal hypertension including small volume ascites and mild splenomegaly. 3. Cholelithiasis. Mild diffuse gallbladder wall thickening. No pericholecystic fluid or sonographic Murphy's sign. Findings are nonspecific. The gallbladder wall thickening may be due to noninflammatory edema (such as due to portal hypertension or hypoalbuminemia). If there is clinical concern for acute cholecystitis, hepatobiliary scintigraphy study may be performed. 4. No biliary ductal dilatation. 5. Simple right renal cyst. ULTRASOUND HEPATIC ELASTOGRAPHY: Technically unsuccessful hepatic elastography. Unable to obtain elastography results despite multiple attempts due to significant liver motion within the ascites, which persisted even with suspended breathing. Electronically Signed   By: Ilona Sorrel M.D.   On: 05/24/2016 18:35     LOS: 6 days   Oren Binet, MD  [facility] Pager:[phone]  If 7PM-7AM, please contact night-coverage [email or web address] Password [email or web address] 05/28/2016, 11:45 AM

## 2016-05-28 NOTE — Progress Notes (Signed)
    Progress Note Cross-cover for Dr. [name]  Subjective  Chief Complaint: Anemia No signs of overt GI bleed, Capsule endoscopy completed and information gathered early this morning, has not yet been read. Patient feeling well and denying complaints. Questioning when they can go home.  Of note wife is a Marine scientist.    Objective   Vital signs in last 24 hours: Temp:  [98.3 F (36.8 C)-99.7 F (37.6 C)] 99.7 F (37.6 C) (04/21 1302) Pulse Rate:  [71-100] 84 (04/21 1302) Resp:  [0-27] 18 (04/21 1302) BP: (115-147)/(54-69) 115/54 (04/21 1302) SpO2:  [93 %-99 %] 96 % (04/21 1302) Weight:  [268 lb 1.6 oz (121.6 kg)-274 lb (124.3 kg)] 268 lb 1.6 oz (121.6 kg) (04/21 0432) Last BM Date: 05/27/16 General:  Obese Caucasian male in NAD Heart:  Regular rate and rhythm; no murmurs Lungs: Respirations even and unlabored, lungs CTA bilaterally Abdomen:  Soft, nontender and nondistended. Normal bowel sounds. Extremities:  Without edema. Neurologic:  Alert and oriented,  grossly normal neurologically. Psych:  Cooperative. Normal mood and affect.  Intake/Output from previous day: 04/20 0701 - 04/21 0700 In: 680 [P.O.:480; I.V.:200] Out: 2775 [Urine:2775] Intake/Output this shift: Total I/O In: 260 [P.O.:260] Out: 6100 [Urine:6100]  Lab Results:  Recent Labs  05/26/16 0307 05/26/16 1037 05/27/16 0337 05/28/16 0524  WBC 5.6  --  6.6 7.9  HGB 7.8*  --  9.4* 9.1*  HCT 25.0*  --  29.0* 29.0*  PLT 94* 88* 107* 98*   BMET  Recent Labs  05/26/16 0307 05/27/16 0337 05/28/16 0524  NA 137 137 134*  K 3.3* 3.9 3.6  CL 104 105 102  CO2 24 23 22   GLUCOSE 91 79 95  BUN 6 6 5*  CREATININE 0.92 0.92 1.11  CALCIUM 8.3* 8.4* 8.2*   LFT  Recent Labs  05/26/16 1037 05/27/16 0337  PROT  --  6.5  ALBUMIN  --  2.7*  AST  --  33  ALT  --  23  ALKPHOS  --  69  BILITOT 8.2* 8.6*  BILIDIR 1.3*  --   IBILI 6.9*  --    PT/INR  Recent Labs  05/26/16 1037  LABPROT 22.7*  INR 1.97       Assessment / Plan:   Assessment: 1. Anemia: Thought multifactorial; so far EGD and colonoscopy negative for etiology, pill capsule done/finished this morning, not yet read, hgb stable today 2. Diastolic CHF 3. Thrombocytopenia 4. Mild coagulopathy 5. Hyperbilirubinemia  Plan: 1. Continue to trend hgb with transfusion as needed 2. Patient's capsule is waiting to be read, unsure as to timing of this 3. Pt asking when he can go home, will leave decision to Dr. Loletha Carrow who will round on patient later today    LOS: 6 days   William [name]  05/28/2016, 1:39 PM  Pager # [phone]

## 2016-05-28 NOTE — Progress Notes (Signed)
Pt ambulated 300 feet in the hallway with wife and no assistive devices. Pt tolerated on well on room air.   Kendan Cornforth Leory Plowman

## 2016-05-29 ENCOUNTER — Encounter (HOSPITAL_COMMUNITY): Payer: Self-pay | Admitting: Gastroenterology

## 2016-05-29 ENCOUNTER — Other Ambulatory Visit: Payer: Self-pay | Admitting: Oncology

## 2016-05-29 DIAGNOSIS — N181 Chronic kidney disease, stage 1: Secondary | ICD-10-CM

## 2016-05-29 DIAGNOSIS — N170 Acute kidney failure with tubular necrosis: Secondary | ICD-10-CM

## 2016-05-29 LAB — BASIC METABOLIC PANEL
Anion gap: 9 (ref 5–15)
BUN: 9 mg/dL (ref 6–20)
CO2: 25 mmol/L (ref 22–32)
Calcium: 8.3 mg/dL — ABNORMAL LOW (ref 8.9–10.3)
Chloride: 102 mmol/L (ref 101–111)
Creatinine, Ser: 1.09 mg/dL (ref 0.61–1.24)
GFR calc Af Amer: >60 mL/min (ref >60)
GFR calc non Af Amer: >60 mL/min (ref >60)
GLUCOSE: 90 mg/dL (ref 65–99)
POTASSIUM: 3.7 mmol/L (ref 3.5–5.1)
Sodium: 136 mmol/L (ref 135–145)

## 2016-05-29 LAB — CBC
HCT: 28.6 % — ABNORMAL LOW (ref 39.0–52.0)
HEMOGLOBIN: 9 g/dL — AB (ref 13.0–17.0)
MCH: 26 pg (ref 26.0–34.0)
MCHC: 31.5 g/dL (ref 30.0–36.0)
MCV: 82.7 fL (ref 78.0–100.0)
Platelets: 97 10*3/uL — ABNORMAL LOW (ref 150–400)
RBC: 3.46 MIL/uL — AB (ref 4.22–5.81)
RDW: 17.2 % — ABNORMAL HIGH (ref 11.5–15.5)
WBC: 7.4 10*3/uL (ref 4.0–10.5)

## 2016-05-29 LAB — HEPATIC FUNCTION PANEL
ALT: 21 U/L (ref 17–63)
AST: 33 U/L (ref 15–41)
Albumin: 2.5 g/dL — ABNORMAL LOW (ref 3.5–5.0)
Alkaline Phosphatase: 65 U/L (ref 38–126)
BILIRUBIN DIRECT: 1.1 mg/dL — AB (ref 0.1–0.5)
BILIRUBIN INDIRECT: 5.3 mg/dL — AB (ref 0.3–0.9)
BILIRUBIN TOTAL: 6.4 mg/dL — AB (ref 0.3–1.2)
Total Protein: 6.2 g/dL — ABNORMAL LOW (ref 6.5–8.1)

## 2016-05-29 LAB — PROTIME-INR
INR: 1.99
Prothrombin Time: 22.9 seconds — ABNORMAL HIGH (ref 11.4–15.2)

## 2016-05-29 LAB — GLUCOSE, CAPILLARY: GLUCOSE-CAPILLARY: 89 mg/dL (ref 65–99)

## 2016-05-29 LAB — HAPTOGLOBIN: Haptoglobin: 38 mg/dL (ref 34–200)

## 2016-05-29 MED ORDER — POLYSACCHARIDE IRON COMPLEX 150 MG PO CAPS: 150.0000 mg | ORAL_CAPSULE | Freq: Every day | ORAL | 0 refills | Status: DC

## 2016-05-29 MED ORDER — SPIRONOLACTONE 25 MG PO TABS: 25.0000 mg | ORAL_TABLET | Freq: Every day | ORAL | 0 refills | Status: DC

## 2016-05-29 MED ORDER — POTASSIUM CHLORIDE CRYS ER 20 MEQ PO TBCR: 20.0000 meq | EXTENDED_RELEASE_TABLET | Freq: Two times a day (BID) | ORAL | 0 refills | Status: DC

## 2016-05-29 MED ORDER — TORSEMIDE 20 MG PO TABS: 40.0000 mg | ORAL_TABLET | Freq: Every day | ORAL | 0 refills | Status: DC

## 2016-05-29 MED ORDER — METOPROLOL TARTRATE 25 MG PO TABS: 12.5000 mg | ORAL_TABLET | Freq: Two times a day (BID) | ORAL | 0 refills | Status: DC

## 2016-05-29 MED ORDER — SPIRONOLACTONE 25 MG PO TABS
25.0000 mg | ORAL_TABLET | Freq: Every day | ORAL | Status: DC
  Administered 2016-05-29: 25 mg via ORAL
  Filled 2016-05-29: qty 1

## 2016-05-29 NOTE — Progress Notes (Addendum)
Progress Note  Patient Name: William [name] Date of Encounter: 05/29/2016  Primary Cardiologist: Dr. Claiborne Billings  Subjective   He denies any chest pain or SOB in the last 24 hours   Inpatient Medications    Scheduled Meds: . insulin aspart  0-5 Units Subcutaneous QHS  . insulin aspart  0-9 Units Subcutaneous TID WC  . iron polysaccharides  150 mg Oral Daily  . metoprolol tartrate  12.5 mg Oral BID  . potassium chloride  40 mEq Oral BID  . sodium chloride flush  3 mL Intravenous Q12H  . torsemide  40 mg Oral Daily   Continuous Infusions:  PRN Meds: acetaminophen **OR** acetaminophen, hydrALAZINE, ondansetron **OR** ondansetron (ZOFRAN) IV   Vital Signs    Vitals:   05/28/16 0900 05/28/16 1302 05/28/16 2109 05/29/16 0532  BP: 135/68 (!) 115/54 (!) 125/57 (!) 122/51  Pulse: 82 84 95 86  Resp: 18 18 18 18   Temp:  99.7 F (37.6 C) 99.1 F (37.3 C) 98.6 F (37 C)  TempSrc:  Oral Oral Oral  SpO2: 97% 96% 93% 92%  Weight:    263 lb 11.2 oz (119.6 kg)  Height:        Intake/Output Summary (Last 24 hours) at 05/29/16 0958 Last data filed at 05/29/16 0908  Gross per 24 hour  Intake              960 ml  Output             2550 ml  Net            -1590 ml   Filed Weights   05/27/16 1819 05/28/16 0432 05/29/16 0532  Weight: 274 lb (124.3 kg) 268 lb 1.6 oz (121.6 kg) 263 lb 11.2 oz (119.6 kg)     Physical Exam   General:WD, WN in NAD Neck: No JVD or bruits Lungs: clear to auscultation bilaterally Heart: RRR with no M/R/G Abdomen:soft NT, ND Extremities: 1+edema Neuro: A&O x 3 Psych: normal affect  Labs    Chemistry  Recent Labs Lab 05/26/16 0307 05/26/16 1037 05/27/16 0337 05/28/16 0524 05/29/16 0419  NA 137  --  137 134* 136  K 3.3*  --  3.9 3.6 3.7  CL 104  --  105 102 102  CO2 24  --  23 22 25   GLUCOSE 91  --  79 95 90  BUN 6  --  6 5* 9  CREATININE 0.92  --  0.92 1.11 1.09  CALCIUM 8.3*  --  8.4* 8.2* 8.3*  PROT 6.0*  --  6.5  --  6.2*    ALBUMIN 2.6*  --  2.7*  --  2.5*  AST 31  --  33  --  33  ALT 23  --  23  --  21  ALKPHOS 65  --  69  --  65  BILITOT 7.9* 8.2* 8.6*  --  6.4*  GFRNONAA >60  --  >60 >60 >60  GFRAA >60  --  >60 >60 >60  ANIONGAP 9  --  9 10 9      Hematology  Recent Labs Lab 05/27/16 0337 05/28/16 0524 05/29/16 0419  WBC 6.6 7.9 7.4  RBC 3.52* 3.52* 3.46*  HGB 9.4* 9.1* 9.0*  HCT 29.0* 29.0* 28.6*  MCV 82.4 82.4 82.7  MCH 26.7 25.9* 26.0  MCHC 32.4 31.4 31.5  RDW 16.8* 16.8* 17.2*  PLT 107* 98* 97*    Cardiac Enzymes  Recent Labs Lab 05/22/16 1434  TROPONINI 0.11*    No results for input(s): TROPIPOC in the last 168 hours.   BNP  Recent Labs Lab 05/26/16 0307  BNP 343.4*     DDimer   Recent Labs Lab 05/26/16 1037  DDIMER 3.37*     Radiology    No results found.   Telemetry    NSR - I have personally reviewed  ECG    No new EKGs  to review   Cardiac Studies   05/19/2016  ECHO Study Conclusions  - Left ventricle: The cavity size was normal. Wall thickness was increased in a pattern of mild LVH. Systolic function was normal. The estimated ejection fraction was in the range of 60% to 65%. Wall motion was normal; there were no regional wall motion abnormalities. Doppler parameters are consistent with pseudonormal left ventricular relaxation (grade 2 diastolic dysfunction). The E/e&' ratio is >15, suggesting elevated LV filling pressure. - Aortic valve: Mildly calcified leaflets. Mild stenosis. Mean gradient (S): 23 mm Hg. Peak gradient (S): 36 mm Hg. Valve area (VTI): 1.77 cm^2. Valve area (Vmax): 1.82 cm^2. Valve area (Vmean): 1.68 cm^2. - Aorta: Dilated aortic root and ascending aorta. Aortic root dimension: 41 mm (ED). Ascending aortic diameter: 42 mm (S). - Mitral valve: Mildly thickened leaflets . There was trivial regurgitation. - Left atrium: The atrium was mildly dilated. - Right atrium: The atrium was mildly  dilated. - Tricuspid valve: There was mild regurgitation. - Pulmonary arteries: PA peak pressure: 28 mm Hg (S). - Inferior vena cava: The vessel was normal in size. The respirophasic diameter changes were in the normal range (>= 50%), consistent with normal central venous pressure.  Impressions:  - LVEF 60-65%, mild LVH, normal wall motion, grade 2 DD with elevated LV filling pressure, mild aortic stenosis - AVA around 1.8 cm2 based on LVOT diameter of 2.2 cm, 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 and ascending aorta to 4.2 cm, trivial MR, mild biatrial enlargement, mild TR, RVSP 28 mmHg, normal IVC.   Myoview 05/04/16  The left ventricular ejection fraction is hyperdynamic (>65%).  Nuclear stress EF: 66%.  No T wave inversion was noted during stress.  There was no ST segment deviation noted during stress.  This is a low risk study.   Normal perfusion. LVEF 66% with normal wall motion. This is a low risk study.  Patient Profile     66 y.o. male with a past medical history significant for coronary artery disease s/p CABG 2008 (LIMA to the LAD, SVG to the RCA, SVG to the circumflex ), PAD, AAA, diabetes mellitus type 2 on metformin monotherapy, hyperlipidemia intolerant to statins, probable fatty liver, hypertension admitted with lower extremity edema, orthopnea, 16 pound weight gain and abnormal ECG in the setting of severe anemia.    Assessment & Plan    1. Acute blood loss/iron deficiency anemia; s/p  PRBC txs. Guiac positive, Ferritin 8  Fe sat 4%.   Hbg is 9.1 after additional unit transfusion.   This am Hbg stable at 9.  Endoscopy did not reveal abnormality.  Colonoscopy did not reveal definitive bleeding source.  Results of capsule enteroscopy are pending.   2. Acute on chronic diastolic heart failure.  Good diuresis - he is net negative 2.6L overnight and net neg 5.9L   Weight 263 lb today (277 lbs on admission).    - continue diuretics - home dose of Torsemide  (he was on 74m daily maintenance at home) now on 477mdaily and diuresing well so will  continue to follow for now. His creatinine remains stable at 1.09.  3 CAD; s/p CABG 03/2006; suspect anemia related ischemia; currently no chest pain and ECG improved on 4/16, repeat EKG on 05/25/16 with ST changes in I, AVL and V5.   Mild troponin increase at 0.11 likely related to demand ischemia.  4. Essential HTN - BP well controlled on current meds.  Will continue on BB and hydralazine PRN.  5. Hyperlipidemia with inability to take numerous statins, with LFT elevation  under evaluation for Repatha approval  6. Mild AS on recent echo with mean gradient 23, peak 36 mm Hg; AVA ~1.8 cm2  7. Stage 3 CKD  Avoid ACE-I/ARB for now;   Cr was 0.98 11/2015 and 1.22 03/2016.  Now improving Cr 1.62 >1.33>1.08 > 0.92 > 0.92 > 1.1> 1.09 today.   8. Hypokalemia, resolved, K now 3.7, continue daily BMP and daily K+ supp  He is stable for discharge from cardiac standpoint.  He has followup with the PA in our office no Wednesday that I instructed him to keep.  Fritz Pickerel , PA-C 9:58 AM 05/29/2016 Pager: [phone]

## 2016-05-29 NOTE — Discharge Summary (Signed)
PATIENT DETAILS Name: William [name] Age: 66 y.o. Sex: male Date of Birth: [birth year] MRN: [id number]. Admitting Physician: Elwin Mocha, MD LFY:BOFBPZW,CHENID H, MD  Admit Date: 05/22/2016 Discharge date: 05/29/2016  Recommendations for Outpatient Follow-up:  1. Follow up with PCP in 1-2 weeks 2. Please obtain CMP/CBC in the next 2-3 days 3. Please follow up on the following pending results: Capsule endoscopy, colonoscopy biopsy  Admitted From:  Home  Disposition: [place]: No  Equipment/Devices: None  Discharge Condition: Stabl  CODE STATUS: FULL CODE  Diet recommendation:  Heart Healthy / Carb Modified  Brief Summary: See H&P, Labs, Consult and Test reports for all details in brief,Patient is a 67 y.o. male with history of diabetes, hypertension, CAD status post CABG in 2008,AAA admitted with symptomatic anemia. Admitted and transfused PRBC, underwent EGD/colonoscopy without obvious cause of blood loss, awaiting results of capsule endoscopy. Hospital course complicated by acute on chronic diastolic heart failure. See below for further details.  Brief Hospital Course: Suspected occult GI bleeding: Claims to have brown stools yesterday-no overt bleeding seen on colonoscopy/endoscopy. Underwent capsule endoscopy on 4/20-results pending, patient to follow-up with his primary gastroenterologist on 4/23 for capsule endoscopy results.  Acute blood loss anemia: Probably secondary to above, transfused multiple units of PRBC-hemoglobin currently stable. Although, likely to be blood loss anemia-and iron panel is suggestive of iron deficiency-he has has significant elevation beyond his usual baseline-hence LDH, haptoglobin and direct Coombs test will check to make sure he does not have hemolysis. LDH was minimally elevated-and Coombs test negative-doubt hemolysis at this time. Furthermore CBC with stable hemoglobin for the past 3 days, and down trending bilirubin. His  reticulocyte count is also on the lower side-likely indicating iron deficiency anemia. Case was discussed with hematology-Dr Magrinat-over the phone-who reviewed her chart, he suggested that we continue iron supplementation and have patient continue to follow CBC in the outpatient setting.   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 Although anemia is probably likely secondary to blood loss, hemolysis was felt unlikely at this time-see above.  Thrombocytopenia: I suspect this is probably due to underlying liver cirrhosis-ultrasound abdomen this admission does suggest liver cirrhosis. However we will rule out hemolytic anemia-see above.  Liver Cirrhoses/NAFLD: Seen on abdominal ultrasound-per GI note-known history of nonalcoholic fatty liver disease. Follows with gastroenterology in the outpatient setting.  Acute on chronic diastolic heart failure: Volume status has improved significantly, -5.9 L so far, weight decreased to 263 pounds (277 pounds on admission). Cardiology followed closely, recommendations are to continue with Lasix and Aldactone on discharge-he has a follow-up appointment with cardiology on 4/25.   History of CAD status post CABG in 2008  Hypertension: Controlled with metoprolol, and Demadex.  DM-2: CBGs stable with SSI-resume metformin on discharge  Procedures/Studies: Capsule endoscopy 4/20>> pending results Colonoscopy 4/20>>Erythematous mucosa in the ascending colon and in the cecum.  EGD 4/18>> no major abnormality  Discharge Diagnoses:  Principal Problem:   GI bleed Active Problems:   Essential hypertension   Pulmonary edema   Acute on chronic renal failure (HCC)   DM II (diabetes mellitus, type II), controlled (HCC)   Acute on chronic diastolic heart failure (HCC)   Anemia   Discharge  Instructions:  Activity:  As tolerated with Full fall precautions use walker/cane & assistance as needed  Discharge Instructions    (HEART FAILURE PATIENTS) Call MD:  Anytime you have any of the following symptoms: 1) 3 pound weight gain in 24 hours or 5 pounds in 1 week 2) shortness of breath, with or without a dry hacking cough 3) swelling in the hands, feet or stomach 4) if you have to sleep on extra pillows at night in order to breathe.    Complete by:  As directed    AMB Referral to [facility]    Complete by:  As directed    Please assign UMR member for post discharge call. Currently at [facility]. Discussed LTW for DM management. Please readdress interest in LTW during post discharge call. Thanks and please call with questions. Marthenia Rolling, [place], RN,BSN-THN [facility] [id number]-[phone]   Reason for consult:  Please assign UMR member for post discharge call   Diagnoses of:   Heart Failure Diabetes     Expected date of contact:  1-3 days (reserved for hospital discharges)   Diet - low sodium heart healthy    Complete by:  As directed    Diet Carb Modified    Complete by:  As directed    Discharge instructions    Complete by:  As directed    Follow with Primary MD  Tammi Sou, MD in 1 week  Please follow up with cardiology and gastroenterology within 1 week.  Capsule endoscopy results are currently pending-please follow with your primary gastroenterologist  Please get a complete blood count and chemistry panel checked by your Primary MD at your next visit, and again as instructed by your Primary MD.  Get Medicines reviewed and adjusted: Please take all your medications with you for your next visit with your Primary MD  Laboratory/radiological data: Please request your Primary MD to go over all hospital tests and procedure/radiological results at the follow up, please ask your Primary MD to get all Hospital records sent to his/her office.  In  some cases, they will be blood work, cultures and biopsy results pending at the time of your discharge. Please request that your primary care M.D. follows up on these results.  Also Note the following: If you experience worsening of your admission symptoms, develop shortness of breath, life threatening emergency, suicidal or homicidal thoughts you must seek medical attention immediately by calling 911 or calling your MD immediately  if symptoms less severe.  You must read complete instructions/literature along with all the possible adverse reactions/side effects for all the Medicines you take and that have been prescribed to you. Take any new Medicines after you have completely understood and accpet all the possible adverse reactions/side effects.   Do not drive when taking Pain medications or sleeping medications (Benzodaizepines)  Do not take more than prescribed Pain, Sleep and Anxiety Medications. It is not advisable to combine anxiety,sleep and pain medications without talking with your primary care practitioner  Special Instructions: If you have smoked or chewed Tobacco  in the last 2 yrs please stop smoking, stop any regular Alcohol  and or any Recreational drug use.  Wear Seat belts while driving.  Please note: You were cared for by a hospitalist during your hospital stay. Once you are discharged, your primary care physician will handle any further medical issues. Please note that NO REFILLS for any discharge medications will be authorized once you are discharged, as it is imperative that you return to your primary care physician (or establish a relationship with a primary care physician if you do not have one) for your post hospital discharge needs  so that they can reassess your need for medications and monitor your lab values.   Increase activity slowly    Complete by:  As directed      Allergies as of 05/29/2016      Reactions   Statins Other (See Comments)   Liver enzymes go up  whenever on them      Medication List    STOP taking these medications   olmesartan 20 MG tablet Commonly known as:  BENICAR     TAKE these medications   fluticasone 50 MCG/ACT nasal spray Commonly known as:  FLONASE Place 2 sprays into both nostrils daily. What changed:  when to take this  reasons to take this   Garlic 7564 MG Caps Take 1 capsule by mouth 2 (two) times daily.   iron polysaccharides 150 MG capsule Commonly known as:  NIFEREX Take 1 capsule (150 mg total) by mouth daily.   metFORMIN 500 MG tablet Commonly known as:  GLUCOPHAGE Take 1 tablet (500 mg total) by mouth 2 (two) times daily with a meal.   metoprolol tartrate 25 MG tablet Commonly known as:  LOPRESSOR Take 0.5 tablets (12.5 mg total) by mouth 2 (two) times daily. What changed:  See the new instructions.   omega-3 acid ethyl esters 1 g capsule Commonly known as:  LOVAZA TAKE 2 CAPSULES BY MOUTH ONCE DAILY What changed:  See the new instructions.   potassium chloride SA 20 MEQ tablet Commonly known as:  K-DUR,KLOR-CON Take 1 tablet (20 mEq total) by mouth 2 (two) times daily.   spironolactone 25 MG tablet Commonly known as:  ALDACTONE Take 1 tablet (25 mg total) by mouth daily.   torsemide 20 MG tablet Commonly known as:  DEMADEX Take 2 tablets (40 mg total) by mouth daily. What changed:  how much to take      Follow-up Information    MCGOWEN,PHILIP H, MD. Schedule an appointment as soon as possible for a visit in 1 week(s).   Specialty:  Family Medicine Contact information: [address] [place] [address] [place], [place] Follow up on 06/01/2016.   Specialties:  Cardiology, Radiology Why:  APPOINTMENT AT 2:30 Contact information: [address] [place]  [address] [phone]        Juanita Craver, MD Follow up.   Specialty:  Gastroenterology Why:  Call office on 4/23 to get the results of capsule endoscopy. Contact  information: [address] [place] [address] [phone]          Allergies  Allergen Reactions  . Statins Other (See Comments)    Liver enzymes go up whenever on them    Consultations:   cardiology and GI  Other Procedures/Studies: Dg Chest 2 View  Result Date: 05/22/2016 CLINICAL DATA:  Shortness of breath.  Lower extremity swelling. EXAM: CHEST  2 VIEW COMPARISON:  Two-view chest x-ray 04/27/2006 FINDINGS: Heart is enlarged. A diffuse interstitial pattern is now present, suggesting edema. There are no significant effusions. Mild bibasilar atelectasis is present. No significant airspace consolidation is present. The visualized soft tissues and bony thorax are unremarkable. IMPRESSION: 1. Cardiomegaly and mild edema compatible with congestive heart failure. 2. Mild bibasilar airspace disease likely reflects atelectasis. Electronically Signed   By: San Morelle M.D.   On: 05/22/2016 09:04   Dg Chest Port 1 View  Result Date: 05/23/2016 CLINICAL DATA:  Pulmonary edema. EXAM: PORTABLE CHEST 1 VIEW COMPARISON:  Radiographs of May 22, 2016. FINDINGS: Stable cardiomegaly. Status post coronary artery bypass graft. Stable bilateral perihilar and basilar opacities are noted concerning for pulmonary edema. No pneumothorax or pleural effusion is noted. Bony thorax is unremarkable. IMPRESSION: Stable bilateral pulmonary edema. Electronically Signed   By: Marijo Conception, M.D.   On: 05/23/2016 07:22   US Abdomen Complete W/elastography  Result Date: 05/24/2016 CLINICAL DATA:  Nonalcoholic steatohepatitis. Elevated liver function tests. Abdominal pain. EXAM: ULTRASOUND ABDOMEN ULTRASOUND HEPATIC ELASTOGRAPHY TECHNIQUE: Sonography of the upper abdomen was performed. In addition, ultrasound elastography evaluation of the liver was performed. A region of interest was placed within the right lobe of the liver. Following application of a compressive sonographic pulse,  shear waves were detected in the adjacent hepatic tissue and the shear wave velocity was calculated. Multiple assessments were performed at the selected site. Median shear wave velocity is correlated to a Metavir fibrosis score. COMPARISON:  03/15/2013 MRI abdomen.  03/08/2013 CT abdomen/pelvis. FINDINGS: ULTRASOUND ABDOMEN Gallbladder: Numerous layering subcentimeter calcified gallstones. Mild gallbladder wall thickening. No pericholecystic fluid. No sonographic Murphy's sign. Common bile duct: Diameter: 4 mm Liver: Diffusely irregular liver surface and coarsened liver parenchymal echotexture, compatible with cirrhosis. Liver parenchyma is diffusely mildly echogenic, which may indicate hepatic steatosis. No liver mass detected. IVC: No abnormality visualized. Pancreas: Poorly visualized due to patient body habitus and overlying bowel gas. Spleen: Mildly enlarged spleen (craniocaudal splenic length 13.0 cm). No splenic mass. Splenic dimensions 13.0 x 5.2 x 14.5 cm (volume = 510 cm^3). Right Kidney: Length: 13.1 cm. Normal right renal parenchymal echogenicity and thickness. No right hydronephrosis. Simple 4.6 x 3.9 x 3.5 cm lateral interpolar right renal cyst. Left Kidney: Length: 12.5 cm. Echogenicity within normal limits. No mass or hydronephrosis visualized. Abdominal aorta: No aneurysm visualized. Other findings: Small volume ascites throughout all 4 quadrants of the abdomen. ULTRASOUND HEPATIC ELASTOGRAPHY Device: Siemens Helix VTQ Patient position: Supine/ Left lateral decubitus Transducer 4V1 Number of measurements: 4 Hepatic segment:  8 Limitations of exam: Unable to obtain elastography results due to significant liver motion within the ascites, which persisted even with suspended breathing. Please note that abnormal shear wave velocities may also be identified in clinical settings other than with hepatic fibrosis, such as: acute hepatitis, elevated right heart and central venous pressures including use of  beta blockers, veno-occlusive disease (Budd-Chiari), infiltrative processes such as mastocytosis/amyloidosis/infiltrative tumor, extrahepatic cholestasis, in the post-prandial state, and liver transplantation. Correlation with patient history, laboratory data, and clinical condition recommended. IMPRESSION: ULTRASOUND ABDOMEN: 1. Morphologic changes of hepatic cirrhosis. No liver mass detected. 2. Secondary findings of portal hypertension including small volume ascites and mild splenomegaly. 3. Cholelithiasis. Mild diffuse gallbladder wall thickening. No pericholecystic fluid or sonographic Murphy's sign. Findings are nonspecific. The gallbladder wall thickening may be due to noninflammatory edema (such as due to portal hypertension or hypoalbuminemia). If there is clinical concern for acute cholecystitis, hepatobiliary scintigraphy study may be performed. 4. No biliary ductal dilatation. 5. Simple right renal cyst. ULTRASOUND HEPATIC ELASTOGRAPHY: Technically unsuccessful hepatic elastography. Unable to obtain elastography results despite multiple attempts due to significant liver motion within the ascites, which persisted even with suspended breathing. Electronically Signed   By: Ilona Sorrel M.D.   On: 05/24/2016 18:35      TODAY-DAY OF DISCHARGE:  Subjective:   Lilburn Straw today has no headache,no chest abdominal pain,no new weakness tingling or numbness, feels much better wants to go home today.   Objective:   Blood pressure (!) 122/51, pulse  86, temperature 98.6 F (37 C), temperature source Oral, resp. rate 18, height 5' 4"  (1.626 m), weight 119.6 kg (263 lb 11.2 oz), SpO2 92 %.  Intake/Output Summary (Last 24 hours) at 05/29/16 1045 Last data filed at 05/29/16 0908  Gross per 24 hour  Intake              960 ml  Output             2550 ml  Net            -1590 ml   Filed Weights   05/27/16 1819 05/28/16 0432 05/29/16 0532  Weight: 124.3 kg (274 lb) 121.6 kg (268 lb 1.6 oz) 119.6 kg  (263 lb 11.2 oz)    Exam: Awake Alert, Oriented *3, No new F.N deficits, Normal affect [place].AT,PERRAL Supple Neck,No JVD, No cervical lymphadenopathy appriciated.  Symmetrical Chest wall movement, Good air movement bilaterally, CTAB RRR,No Gallops,Rubs or new Murmurs, No Parasternal Heave +ve B.Sounds, Abd Soft, Non tender, No organomegaly appriciated, No rebound -guarding or rigidity. No Cyanosis, Clubbing , No new Rash or bruise. Has + edema in lower ext   PERTINENT RADIOLOGIC STUDIES: Dg Chest 2 View  Result Date: 05/22/2016 CLINICAL DATA:  Shortness of breath.  Lower extremity swelling. EXAM: CHEST  2 VIEW COMPARISON:  Two-view chest x-ray 04/27/2006 FINDINGS: Heart is enlarged. A diffuse interstitial pattern is now present, suggesting edema. There are no significant effusions. Mild bibasilar atelectasis is present. No significant airspace consolidation is present. The visualized soft tissues and bony thorax are unremarkable. IMPRESSION: 1. Cardiomegaly and mild edema compatible with congestive heart failure. 2. Mild bibasilar airspace disease likely reflects atelectasis. Electronically Signed   By: San Morelle M.D.   On: 05/22/2016 09:04   Dg Chest Port 1 View  Result Date: 05/23/2016 CLINICAL DATA:  Pulmonary edema. EXAM: PORTABLE CHEST 1 VIEW COMPARISON:  Radiographs of May 22, 2016. FINDINGS: Stable cardiomegaly. Status post coronary artery bypass graft. Stable bilateral perihilar and basilar opacities are noted concerning for pulmonary edema. No pneumothorax or pleural effusion is noted. Bony thorax is unremarkable. IMPRESSION: Stable bilateral pulmonary edema. Electronically Signed   By: Marijo Conception, M.D.   On: 05/23/2016 07:22   US Abdomen Complete W/elastography  Result Date: 05/24/2016 CLINICAL DATA:  Nonalcoholic steatohepatitis. Elevated liver function tests. Abdominal pain. EXAM: ULTRASOUND ABDOMEN ULTRASOUND HEPATIC ELASTOGRAPHY TECHNIQUE: Sonography of the upper  abdomen was performed. In addition, ultrasound elastography evaluation of the liver was performed. A region of interest was placed within the right lobe of the liver. Following application of a compressive sonographic pulse, shear waves were detected in the adjacent hepatic tissue and the shear wave velocity was calculated. Multiple assessments were performed at the selected site. Median shear wave velocity is correlated to a Metavir fibrosis score. COMPARISON:  03/15/2013 MRI abdomen.  03/08/2013 CT abdomen/pelvis. FINDINGS: ULTRASOUND ABDOMEN Gallbladder: Numerous layering subcentimeter calcified gallstones. Mild gallbladder wall thickening. No pericholecystic fluid. No sonographic Murphy's sign. Common bile duct: Diameter: 4 mm Liver: Diffusely irregular liver surface and coarsened liver parenchymal echotexture, compatible with cirrhosis. Liver parenchyma is diffusely mildly echogenic, which may indicate hepatic steatosis. No liver mass detected. IVC: No abnormality visualized. Pancreas: Poorly visualized due to patient body habitus and overlying bowel gas. Spleen: Mildly enlarged spleen (craniocaudal splenic length 13.0 cm). No splenic mass. Splenic dimensions 13.0 x 5.2 x 14.5 cm (volume = 510 cm^3). Right Kidney: Length: 13.1 cm. Normal right renal parenchymal echogenicity and thickness. No right hydronephrosis. Simple  4.6 x 3.9 x 3.5 cm lateral interpolar right renal cyst. Left Kidney: Length: 12.5 cm. Echogenicity within normal limits. No mass or hydronephrosis visualized. Abdominal aorta: No aneurysm visualized. Other findings: Small volume ascites throughout all 4 quadrants of the abdomen. ULTRASOUND HEPATIC ELASTOGRAPHY Device: Siemens Helix VTQ Patient position: Supine/ Left lateral decubitus Transducer 4V1 Number of measurements: 4 Hepatic segment:  8 Limitations of exam: Unable to obtain elastography results due to significant liver motion within the ascites, which persisted even with suspended  breathing. Please note that abnormal shear wave velocities may also be identified in clinical settings other than with hepatic fibrosis, such as: acute hepatitis, elevated right heart and central venous pressures including use of beta blockers, veno-occlusive disease (Budd-Chiari), infiltrative processes such as mastocytosis/amyloidosis/infiltrative tumor, extrahepatic cholestasis, in the post-prandial state, and liver transplantation. Correlation with patient history, laboratory data, and clinical condition recommended. IMPRESSION: ULTRASOUND ABDOMEN: 1. Morphologic changes of hepatic cirrhosis. No liver mass detected. 2. Secondary findings of portal hypertension including small volume ascites and mild splenomegaly. 3. Cholelithiasis. Mild diffuse gallbladder wall thickening. No pericholecystic fluid or sonographic Murphy's sign. Findings are nonspecific. The gallbladder wall thickening may be due to noninflammatory edema (such as due to portal hypertension or hypoalbuminemia). If there is clinical concern for acute cholecystitis, hepatobiliary scintigraphy study may be performed. 4. No biliary ductal dilatation. 5. Simple right renal cyst. ULTRASOUND HEPATIC ELASTOGRAPHY: Technically unsuccessful hepatic elastography. Unable to obtain elastography results despite multiple attempts due to significant liver motion within the ascites, which persisted even with suspended breathing. Electronically Signed   By: Ilona Sorrel M.D.   On: 05/24/2016 18:35     PERTINENT LAB RESULTS: CBC:  Recent Labs  05/28/16 0524 05/29/16 0419  WBC 7.9 7.4  HGB 9.1* 9.0*  HCT 29.0* 28.6*  PLT 98* 97*   CMET CMP     Component Value Date/Time   NA 136 05/29/2016 0419   K 3.7 05/29/2016 0419   CL 102 05/29/2016 0419   CO2 25 05/29/2016 0419   GLUCOSE 90 05/29/2016 0419   BUN 9 05/29/2016 0419   CREATININE 1.09 05/29/2016 0419   CREATININE 1.06 05/04/2016 1012   CALCIUM 8.3 (L) 05/29/2016 0419   PROT 6.2 (L)  05/29/2016 0419   ALBUMIN 2.5 (L) 05/29/2016 0419   AST 33 05/29/2016 0419   ALT 21 05/29/2016 0419   ALKPHOS 65 05/29/2016 0419   BILITOT 6.4 (H) 05/29/2016 0419   GFRNONAA >60 05/29/2016 0419   GFRAA >60 05/29/2016 0419    GFR Estimated Creatinine Clearance: 78.6 mL/min (by C-G formula based on SCr of 1.09 mg/dL). No results for input(s): LIPASE, AMYLASE in the last 72 hours. No results for input(s): CKTOTAL, CKMB, CKMBINDEX, TROPONINI in the last 72 hours. Invalid input(s): POCBNP No results for input(s): DDIMER in the last 72 hours. No results for input(s): HGBA1C in the last 72 hours. No results for input(s): CHOL, HDL, LDLCALC, TRIG, CHOLHDL, LDLDIRECT in the last 72 hours. No results for input(s): TSH, T4TOTAL, T3FREE, THYROIDAB in the last 72 hours.  Invalid input(s): FREET3 No results for input(s): VITAMINB12, FOLATE, FERRITIN, TIBC, IRON, RETICCTPCT in the last 72 hours. Coags:  Recent Labs  05/29/16 0419  INR 1.99   Microbiology: Recent Results (from the past 240 hour(s))  MRSA PCR Screening     Status: None   Collection Time: 05/22/16  1:26 PM  Result Value Ref Range Status   MRSA by PCR NEGATIVE NEGATIVE Final    Comment:  The GeneXpert MRSA Assay (FDA approved for NASAL specimens only), is one component of a comprehensive MRSA colonization surveillance program. It is not intended to diagnose MRSA infection nor to guide or monitor treatment for MRSA infections.     FURTHER DISCHARGE INSTRUCTIONS:  Get Medicines reviewed and adjusted: Please take all your medications with you for your next visit with your Primary MD  Laboratory/radiological data: Please request your Primary MD to go over all hospital tests and procedure/radiological results at the follow up, please ask your Primary MD to get all Hospital records sent to his/her office.  In some cases, they will be blood work, cultures and biopsy results pending at the time of your discharge.  Please request that your primary care M.D. goes through all the records of your hospital data and follows up on these results.  Also Note the following: If you experience worsening of your admission symptoms, develop shortness of breath, life threatening emergency, suicidal or homicidal thoughts you must seek medical attention immediately by calling 911 or calling your MD immediately  if symptoms less severe.  You must read complete instructions/literature along with all the possible adverse reactions/side effects for all the Medicines you take and that have been prescribed to you. Take any new Medicines after you have completely understood and accpet all the possible adverse reactions/side effects.   Do not drive when taking Pain medications or sleeping medications (Benzodaizepines)  Do not take more than prescribed Pain, Sleep and Anxiety Medications. It is not advisable to combine anxiety,sleep and pain medications without talking with your primary care practitioner  Special Instructions: If you have smoked or chewed Tobacco  in the last 2 yrs please stop smoking, stop any regular Alcohol  and or any Recreational drug use.  Wear Seat belts while driving.  Please note: You were cared for by a hospitalist during your hospital stay. Once you are discharged, your primary care physician will handle any further medical issues. Please note that NO REFILLS for any discharge medications will be authorized once you are discharged, as it is imperative that you return to your primary care physician (or establish a relationship with a primary care physician if you do not have one) for your post hospital discharge needs so that they can reassess your need for medications and monitor your lab values.  Total Time spent coordinating discharge including counseling, education and face to face time equals 45 minutes.  SignedOren Binet 05/29/2016 10:45 AM

## 2016-05-29 NOTE — Progress Notes (Signed)
Orders received for pt discharge.  Discharge summary printed and reviewed with pt.  Explained medication regimen, and pt had no further questions at this time.  IV removed and site remains clean, dry, intact.  Telemetry removed.  Pt in stable condition and awaiting transport. 

## 2016-05-29 NOTE — Plan of Care (Signed)
Problem: Education: Goal: Knowledge of [facility] Education information/materials will improve Outcome: Progressing POC reviewed with pt.   

## 2016-05-30 ENCOUNTER — Encounter: Payer: Self-pay | Admitting: Family Medicine

## 2016-05-30 ENCOUNTER — Telehealth: Payer: Self-pay

## 2016-05-30 DIAGNOSIS — D509 Iron deficiency anemia, unspecified: Secondary | ICD-10-CM | POA: Diagnosis not present

## 2016-05-30 MED FILL — TORSEMIDE 20 MG TABLET: 20 | 15 days supply | Qty: 30 | Fill #0

## 2016-05-30 NOTE — Telephone Encounter (Signed)
LM requesting call back to complete TCM and schedule hospital f/u.

## 2016-05-30 NOTE — Telephone Encounter (Signed)
Spoke with wife, [name]. Patient unavailable.    Transition Care Management Follow-up Telephone Call   Date discharged? 05/29/2016   How have you been since you were released from the hospital? "Good but tired."    Do you understand why you were in the hospital? yes, "low hgb, ?GI bleed, liver enzymes up"   Do you understand the discharge instructions? yes   Where were you discharged to? Home.   Items Reviewed:  Medications reviewed: no, medication list not available at time of call.   Allergies reviewed: yes  Dietary changes reviewed: yes, heart healthy diet. No questions.   Referrals reviewed: yes   Functional Questionnaire:   Activities of Daily Living (ADLs):   He states they are independent in the following: ambulation, bathing and hygiene, feeding, continence, grooming, toileting and dressing States they require assistance with the following: None.    Any transportation issues/concerns?: no   Any patient concerns? no   Confirmed importance and date/time of follow-up visits scheduled yes  Provider Appointment booked with PCP on Tuesday, 05/31/2016 @ 11:30  Confirmed with patient if condition begins to worsen call PCP or go to the ER.  Patient was given the office number and encouraged to call back with question or concerns.  : yes

## 2016-05-30 NOTE — Telephone Encounter (Signed)
Phone contact for TCM f/u noted.

## 2016-05-31 ENCOUNTER — Encounter: Payer: Self-pay | Admitting: Family Medicine

## 2016-05-31 ENCOUNTER — Other Ambulatory Visit: Payer: Self-pay | Admitting: *Deleted

## 2016-05-31 ENCOUNTER — Encounter: Payer: Self-pay | Admitting: *Deleted

## 2016-05-31 ENCOUNTER — Ambulatory Visit (INDEPENDENT_AMBULATORY_CARE_PROVIDER_SITE_OTHER): Payer: 59 | Admitting: Family Medicine

## 2016-05-31 VITALS — BP 134/77 | HR 84 | Temp 98.3°F | Resp 16 | Ht 69.0 in | Wt 252.2 lb

## 2016-05-31 DIAGNOSIS — R161 Splenomegaly, not elsewhere classified: Secondary | ICD-10-CM | POA: Diagnosis not present

## 2016-05-31 DIAGNOSIS — N189 Chronic kidney disease, unspecified: Secondary | ICD-10-CM | POA: Diagnosis not present

## 2016-05-31 DIAGNOSIS — I5032 Chronic diastolic (congestive) heart failure: Secondary | ICD-10-CM

## 2016-05-31 DIAGNOSIS — D500 Iron deficiency anemia secondary to blood loss (chronic): Secondary | ICD-10-CM | POA: Diagnosis not present

## 2016-05-31 DIAGNOSIS — K7460 Unspecified cirrhosis of liver: Secondary | ICD-10-CM

## 2016-05-31 DIAGNOSIS — I5033 Acute on chronic diastolic (congestive) heart failure: Secondary | ICD-10-CM

## 2016-05-31 LAB — CBC WITH DIFFERENTIAL/PLATELET
BASOS PCT: 1 % (ref 0.0–3.0)
Basophils Absolute: 0.1 10*3/uL (ref 0.0–0.1)
EOS ABS: 0.6 10*3/uL (ref 0.0–0.7)
EOS PCT: 8.1 % — AB (ref 0.0–5.0)
HEMATOCRIT: 35 % — AB (ref 39.0–52.0)
Hemoglobin: 11.4 g/dL — ABNORMAL LOW (ref 13.0–17.0)
LYMPHS PCT: 12.3 % (ref 12.0–46.0)
Lymphs Abs: 0.9 10*3/uL (ref 0.7–4.0)
MCHC: 32.6 g/dL (ref 30.0–36.0)
MCV: 83.2 fl (ref 78.0–100.0)
Monocytes Absolute: 1 10*3/uL (ref 0.1–1.0)
Monocytes Relative: 14.7 % — ABNORMAL HIGH (ref 3.0–12.0)
NEUTROS ABS: 4.4 10*3/uL (ref 1.4–7.7)
Neutrophils Relative %: 63.9 % (ref 43.0–77.0)
PLATELETS: 136 10*3/uL — AB (ref 150.0–400.0)
RBC: 4.21 Mil/uL — ABNORMAL LOW (ref 4.22–5.81)
RDW: 19.6 % — AB (ref 11.5–15.5)
WBC: 6.9 10*3/uL (ref 4.0–10.5)

## 2016-05-31 LAB — COMPREHENSIVE METABOLIC PANEL
ALT: 23 U/L (ref 0–53)
AST: 31 U/L (ref 0–37)
Albumin: 3.3 g/dL — ABNORMAL LOW (ref 3.5–5.2)
Alkaline Phosphatase: 78 U/L (ref 39–117)
BUN: 11 mg/dL (ref 6–23)
CALCIUM: 9.1 mg/dL (ref 8.4–10.5)
CHLORIDE: 99 meq/L (ref 96–112)
CO2: 29 meq/L (ref 19–32)
CREATININE: 1.08 mg/dL (ref 0.40–1.50)
GFR: 72.55 mL/min (ref >60.00)
Glucose, Bld: 147 mg/dL — ABNORMAL HIGH (ref 70–99)
POTASSIUM: 3.6 meq/L (ref 3.5–5.1)
Sodium: 136 mEq/L (ref 135–145)
Total Bilirubin: 4.5 mg/dL — ABNORMAL HIGH (ref 0.2–1.2)
Total Protein: 7.2 g/dL (ref 6.0–8.3)

## 2016-05-31 NOTE — Progress Notes (Signed)
05/31/2016  CC:  Chief Complaint  Patient presents with  . Hospitalization Follow-up    TCM,     Patient is a 66 y.o. Caucasian male who presents for  hospital follow up, specifically Transitional Care Services face-to-face visit. Dates hospitalized: 4/15-4/22, 2018. Days since d/c from hospital: 2 Patient was discharged from hospital to home. Reason for admission to hospital: symptomatic anemia.  Hospitalization complicated by acute on chronic diastolic HF. Date of interactive (phone) contact with patient and/or caregiver: 05/30/16  I have reviewed patient's discharge summary plus pertinent specific notes, labs, and imaging from the hospitalization.   Patient had to be transfused multiple units of blood.  His Hb on presentation was 6.3 and upon d/c it was 9.0.  He was hemoccult positive. Hemolytic anemia labs were done and these did not support hemolysis as the etiology of his anemia.  He was discharged home on oral iron replacement. He got EGD and colonoscopy, neither of which revealed any site potentially responsible for bleeding.  Capsule endoscopy was started but no result as of today.   He also got an ultrasound of abdomen which revealed cirrhotic liver, likely secondary to NASH, mild thrombocytopenia thought to be secondary his mild splenomegaly associated with this.  Elevated T bili also thought to be due to his cirrhosis (not hemolysis).  Regarding his acute on chronic diastolic CHF in hosp, his fluid balance at d/c was -6L.  He was d/c'd home on demadex at increased dosing (was on 26m qd, now on 427mqd as his scheduled dosing), and his aldactone. He had acute on CRI stage III.  Cr improved with d/c of his ARB.   Feeling MUCH MUCH better.  Says fatigue is going away.  No DOE.  No PND.  Has 2 pillow orthopnea. Says swelling is much better but still has some in ankles.   He has had dietary education. He has appt with Dr. MaCollene MaresGI, in 2 days.  He will find out results of his  capsule endoscopy at that visit. He sees Dr. KeEvette GeorgesA tomorrow for f/u diastolic CHF.   Medication reconciliation was done today and patient is taking meds as recommended by discharging hospitalist/specialist.    PMH:  Past Medical History:  Diagnosis Date  . AAA (abdominal aortic aneurysm) (HCYarnell02/2016   3.2 cm on MR abd.  F/u aortic u/s 06/2014 showed 3.0 x 3.1 cm AAA: recheck 2 yrs recommended (followed by cardiologist)  . Bilateral renal cysts    simple (03/2014 MRI)  . CAD (coronary artery disease)   . Chronic diastolic heart failure (HCChurchill  . Cirrhosis (HCMagnolia04/2018   secondary to NASH  . Diabetes mellitus with complication (HCOrient0667/5916 A1c 6.8%  . Hyperlipemia, mixed    elevated LFTs when on statins.    . Hypertension    Cr bump 04/01/16 so I changed benicar-hct to benicar plain and added amlodipine 5 mg.  . Iron deficiency anemia 05/2016   Acute blood loss anemia: hospitalized, required transfusion  . Microscopic hematuria    Eval unremarkable by Dr. DaEulogio Ditch . Marland KitchenASH (nonalcoholic steatohepatitis)    Fatty liver on MR abd 03/2014, + hx of elevated transaminases and bili: followed by Dr. MaCollene Mares . Obesity     PSH:  Past Surgical History:  Procedure Laterality Date  . CARDIAC CATHETERIZATION    . CARDIOVASCULAR STRESS TEST  01/17/2012; 05/04/16   Normal stress nuclear study x 2 (2018--Normal perfusion. LVEF 66% with normal wall motion.  This is a low risk study).  . CERVICAL DISCECTOMY  1992  . COLONOSCOPY N/A 05/27/2016   No site/explanation for blood loss found.  Erythematous mucosa in cecum and ascending colon--Cecal bx: normal.  Procedure: COLONOSCOPY;  Surgeon: Carol Ada, MD;  Location: [facility] ENDOSCOPY;  Service: Endoscopy;  Laterality: N/A;  . COLONOSCOPY W/ POLYPECTOMY  09/10/2014   Polypectomy x 2: recall 5 yrs (Dr. [name]).  . CORONARY ANGIOPLASTY    . CORONARY ARTERY BYPASS GRAFT  03/2006  . ESOPHAGOGASTRODUODENOSCOPY N/A 05/25/2016   No site/explanation for  blood loss found.  Procedure: ESOPHAGOGASTRODUODENOSCOPY (EGD);  Surgeon: Juanita Craver, MD;  Location: [facility] ENDOSCOPY;  Service: Endoscopy;  Laterality: N/A;  . GIVENS CAPSULE STUDY N/A 05/27/2016   Procedure: GIVENS CAPSULE STUDY;  Surgeon: Carol Ada, MD;  Location: [place];  Service: Endoscopy;  Laterality: N/A;  . TRANSTHORACIC ECHOCARDIOGRAM  05/25/2016   EF 60%, normal wall motion, grd II DD, mild aortic stenosis, dilated aortic root and ascending aorta.  [name] VASECTOMY  1977    MEDS:  Outpatient Medications Prior to Visit  Medication Sig Dispense Refill  . fluticasone (FLONASE) 50 MCG/ACT nasal spray Place 2 sprays into both nostrils daily. (Patient taking differently: Place 2 sprays into both nostrils daily as needed for allergies. ) 16 g 6  . Garlic 7353 MG CAPS Take 1 capsule by mouth 2 (two) times daily.    . iron polysaccharides (NIFEREX) 150 MG capsule Take 1 capsule (150 mg total) by mouth daily. 30 capsule 0  . metFORMIN (GLUCOPHAGE) 500 MG tablet Take 1 tablet (500 mg total) by mouth 2 (two) times daily with a meal. 60 tablet 6  . metoprolol (LOPRESSOR) 25 MG tablet Take 0.5 tablets (12.5 mg total) by mouth 2 (two) times daily. 60 tablet 0  . omega-3 acid ethyl esters (LOVAZA) 1 g capsule TAKE 2 CAPSULES BY MOUTH ONCE DAILY (Patient taking differently: TAKE 1 CAPSULES BY MOUTH TWICE DAILY) 180 capsule 3  . potassium chloride SA (K-DUR,KLOR-CON) 20 MEQ tablet Take 1 tablet (20 mEq total) by mouth 2 (two) times daily. 60 tablet 0  . spironolactone (ALDACTONE) 25 MG tablet Take 1 tablet (25 mg total) by mouth daily. 30 tablet 0  . torsemide (DEMADEX) 20 MG tablet Take 2 tablets (40 mg total) by mouth daily. 30 tablet 0   No facility-administered medications prior to visit.   EXAM: BP 134/77 (BP Location: Left Arm, Patient Position: Sitting, Cuff Size: Large)   Pulse 84   Temp 98.3 F (36.8 C) (Oral)   Resp 16   Ht 5' 9"  (1.753 m)   Wt 252 lb 4 oz (114.4 kg)   SpO2 95%   BMI  37.25 kg/m  Gen: Alert, well appearing.  Patient is oriented to person, place, time, and situation. AFFECT: pleasant, lucid thought and speech. GDJ:MEQA: no injection, icteris, swelling, or exudate.  EOMI, PERRLA. Mouth: lips without lesion/swelling.  Oral mucosa pink and moist. Oropharynx without erythema, exudate, or swelling.  CV: RRR, 3/6 holosystolic murmur heard throughout precordium but heard best at L and R USB, radiates to both infraclavicular areas.  S1 obscured, S2 fairly distinct.  No diastolic murmur, rub, or gallop. Chest is clear, no wheezing or rales. Normal symmetric air entry throughout both lung fields. No chest wall deformities or tenderness. ABD: soft, NT, ND but rotund, BS normal.  No hepatospenomegaly or mass.  No bruits. EXT: no clubbing or cyanosis.  He has 3+ pitting edema from mid tibia down into ankles/feet  bilaterally.  Pertinent labs/imaging Lab Results  Component Value Date   TSH 1.19 05/04/2016   Lab Results  Component Value Date   WBC 7.4 05/29/2016   HGB 9.0 (L) 05/29/2016   HCT 28.6 (L) 05/29/2016   MCV 82.7 05/29/2016   PLT 97 (L) 05/29/2016   Lab Results  Component Value Date   IRON 21 (L) 05/22/2016   TIBC 486 (H) 05/22/2016   FERRITIN 8 (L) 05/22/2016   Lab Results  Component Value Date   FOLATE 13.6 05/22/2016   Lab Results  Component Value Date   VITAMINB12 909 05/22/2016   Lab Results  Component Value Date   CREATININE 1.09 05/29/2016   BUN 9 05/29/2016   NA 136 05/29/2016   K 3.7 05/29/2016   CL 102 05/29/2016   CO2 25 05/29/2016   Lab Results  Component Value Date   ALT 21 05/29/2016   AST 33 05/29/2016   ALKPHOS 65 05/29/2016   BILITOT 6.4 (H) 05/29/2016   Lab Results  Component Value Date   CHOL 204 (H) 04/01/2016   Lab Results  Component Value Date   HDL 29.70 (L) 04/01/2016   Lab Results  Component Value Date   LDLCALC 151 (H) 04/01/2016   Lab Results  Component Value Date   TRIG 119.0 04/01/2016    Lab Results  Component Value Date   CHOLHDL 7 04/01/2016   Lab Results  Component Value Date   HGBA1C 6.7 (H) 04/01/2016   ASSESSMENT/PLAN:  1) Symptomatic iron def anemia, suspected due to chronic GI blood loss, unclear site. Pt feeling MUCH improved s/p transfusions in hosp, taking iron pill as rx'd, has f/u with GI in 2d to review findings of capsule endoscopy. Monitor CBC today.  2) Acute on chronic diastolic HF: resolved.  Wt is down significantly since admission to hospital and continues since d/c home. Still with substantial LE edema.  Will rx compression stockings today and we reviewed elevation of legs 20 min per day above the level of his heart.  No change in diuretic dosing today.  He has cardiology f/u tomorrow. Check lytes/cr today.  3) Acute on chronic renal failure: his olmesartan 7m was d/c'd in hospital.  His Cr improved from 2.06 on admission to 1.09 on d/c home. Recheck lytes/cr today.  No restart of olmesartan at this time.  4) Cirrhosis with mild splenomegaly and mild acute liver dysfunction: bilirubin increased from baseline 2s-3s to peak of 8.6 in hospital.  It was down to 6.4 at d/c. Albumin also was normal on last check prior to hospitalization, but was low (nadir of 2.5) in hospital.  This is definitely contributing some to his edema/fluid retention.  INR elevated from 1.7-1.99 in hospital. Transaminases normal.  Continue aldactone, demadex. Recheck hepatic panel today.  Medical decision making of high complexity was utilized today.  An After Visit Summary was printed and given to the patient.  FOLLOW UP:  1 mo: DM 2, anemia, CRI  Signed:  PCrissie Sickles MD           05/31/2016

## 2016-05-31 NOTE — Patient Outreach (Addendum)
[name]) Care Management  05/31/2016  William [name] 03-26-1950 [id number]   Subjective:Received voicemail message from patient's wife William [name], states she is returning call from patient, and requested call back.  Telephone call to patient's home number, spoke with patient, and patient's wife William [name]).  Patient stated name, date of birth, and address.  Discussed [facility] Care Management [facility] Transition of care follow up, patient voiced understanding, wife voices understanding, and is in agreement to complete follow up. Patient states he is doing much better and gave verbal consent for RNCM to speak with wife regarding healthcare needs as needed.   Spoke with patient's wife, states patient has hospital follow up appointment with primary MD this am and the appointment went well.  States patient's weight has decreased from 279.5 to 252.4 pounds.   Wife states patient does have hospital indemnity supplemental plan, is aware she will need itemized statement to file claim.   RNCM gave wife contact number for [facility] patient accounting [phone]) to obtain itemized statement.   Wife is nurse in the [employer] quality department, has flexible schedule is able to work from home, and work around patient's appointments.   States she does not need family medical leave act (FMLA) at this time to care for husband and  husband is retired.  Discussed Link to [employer] and [place],  States she currently has Solicitor and has experienced some issues.  States she will contact [facility] regarding Link to Wellness appointment for patient.  Wife states patient does not have any transition of care, care coordination, transportation, community resource, or pharmacy needs at this time.  States she is very appreciative of the follow up and is in agreement to receive [facility] information on patient's behalf.    Objective: Per chart review, patient hospitalized 05/22/16 - 05/29/16 for  gastrointestinal bleed.  Patient also has a history of Acute blood loss anemia, Hyperbilirubinemia, Liver Cirrhoses/NAFLD (nonalcoholic fatty liver disease), Acute on chronic diastolic heart failure, CAD, hypertension, diabetes,  and status post CABG in 2008.     Assessment: Received [facility] Transition of care referral on 05/24/16.   Transition of care follow up completed, no care management needs, and will proceed with case closure.   Plan: RNCM will send patient successful outreach letter, [facility] pamphlet, and magnet. RNCM will send case closure due to follow up completed / no care management needs request to [facility] [place] [facility]. RNCM send update to [facility] and [facility] [facility] regarding patient's wife request on patient's behalf for Link to Wellness appointment and follow up [facility] issue.     Korrine Sicard H. Annia Friendly, BSN, [place] [facility] Telephonic CM Phone: [phone] Fax: [phone]

## 2016-05-31 NOTE — Progress Notes (Signed)
Pre visit review using our clinic review tool, if applicable. No additional management support is needed unless otherwise documented below in the visit note. 

## 2016-05-31 NOTE — Patient Outreach (Addendum)
[facility]) Care Management  05/31/2016  William [name] Jan 13, 1951 [id number]   Subjective: Telephone call to patient's home number, no answer, left HIPAA compliant voicemail message, and requested call back.   Objective: Per chart review, patient hospitalized 05/22/16 - 05/29/16 for gastrointestinal bleed.  Patient also has a history of Acute blood loss anemia, Hyperbilirubinemia, Liver Cirrhoses/NAFLD (nonalcoholic fatty liver disease), Acute on chronic diastolic heart failure, CAD, hypertension, diabetes,  and status post CABG in 2008.     Assessment: Received [facility]MR Transition of care referral on 05/24/16.   Transition of care follow up pending patient contact.    Plan: RNCM will call patient for 2nd telephone outreach attempt, transition of care follow up, within 10 business days if no return call.   Daeja Helderman H. Annia Friendly, BSN, [place] [facility] Telephonic CM Phone: [phone] Fax: [phone]

## 2016-06-01 ENCOUNTER — Ambulatory Visit: Payer: 59 | Admitting: *Deleted

## 2016-06-01 ENCOUNTER — Ambulatory Visit (INDEPENDENT_AMBULATORY_CARE_PROVIDER_SITE_OTHER): Payer: 59 | Admitting: Physician Assistant

## 2016-06-01 ENCOUNTER — Encounter: Payer: Self-pay | Admitting: Physician Assistant

## 2016-06-01 VITALS — BP 126/75 | HR 90 | Ht 69.0 in | Wt 250.2 lb

## 2016-06-01 DIAGNOSIS — Z79899 Other long term (current) drug therapy: Secondary | ICD-10-CM

## 2016-06-01 DIAGNOSIS — I10 Essential (primary) hypertension: Secondary | ICD-10-CM

## 2016-06-01 DIAGNOSIS — I5032 Chronic diastolic (congestive) heart failure: Secondary | ICD-10-CM

## 2016-06-01 DIAGNOSIS — D649 Anemia, unspecified: Secondary | ICD-10-CM

## 2016-06-01 DIAGNOSIS — E785 Hyperlipidemia, unspecified: Secondary | ICD-10-CM | POA: Diagnosis not present

## 2016-06-01 DIAGNOSIS — E119 Type 2 diabetes mellitus without complications: Secondary | ICD-10-CM

## 2016-06-01 DIAGNOSIS — I25810 Atherosclerosis of coronary artery bypass graft(s) without angina pectoris: Secondary | ICD-10-CM

## 2016-06-01 NOTE — Patient Instructions (Addendum)
Heart failure prevention instruction:  1. Avoid salt 2. Limit daily fluid intake to < 2 liters 3. Weigh yourself every morning, call cardiology if weight increase by more than 3 lbs overnight or 5 lbs in a single week.  Labwork requested --- BMET and CBC -- can by done at primary care office.  No changes to your medications  Follow up with Dr. Claiborne Billings in 3 months.

## 2016-06-01 NOTE — Progress Notes (Signed)
Cardiology Office Note    Date:  06/02/2016   ID:  William [name], William [name] 1951/01/06, MRN [id number]  PCP:  William Sou, MD  Cardiologist:  Dr. Claiborne [name]   Chief Complaint  Patient presents with  . Hospitalization Follow-up    seen for Dr. Claiborne [name], recent admission for GI bleed and heart failure    History of Present Illness:  William [name] is a 66 y.o. male with PMH of CAD s/p CABG 05/8544, chronic diastolic heart failure, h/o NASH cirrhosis, DM II, hyperlipidemia, hypertension, PAD, AAA. She presented with lower extremity edema, orthopnea and weight gain. He had a recent Myoview in March 2018 that was low risk, EF 66%, normal wall motion. Echocardiogram obtained on 05/19/2016 showed EF 60-65%, mild LVH, 42 mm ascending aortic root diameter, PA peak pressure 28 mmHg. He also required blood transfusion for severe anemia which was felt to be upper GI in nature. He was Guiac positive. Although his EGD did not identify obvious source of bleeding. Troponin was mildly elevated due to demand ischemia. He was diuresed. His admission weight was 277 pounds, his discharge weight was 263 pounds.  Since his discharge, he has been doing well. He does not have any shortness of breath today is. He is hemoglobin has ordered also came up, based on yesterday's lab, his hemoglobin has increased from 9.0 prior to discharge to 11.4. Renal function stable. He is currently on was mild and spironolactone. His potassium was mildly low yesterday at 3.6. I did tell him to take an additional dose of potassium supplement once. We discussed heart failure prevention including avoidance of salt and limit daily fluid intake to less than 2 L per day. He will contact cardiology if his weight increases by more than 3 pounds overnight or 5 pounds in a single week.   Past Medical History:  Diagnosis Date  . AAA (abdominal aortic aneurysm) ([place]) 03/2014   3.2 cm on MR abd.  F/u aortic u/s 06/2014 showed 3.0 x 3.1 cm AAA: recheck 2  yrs recommended (followed by cardiologist)  . Bilateral renal cysts    simple (03/2014 MRI)  . CAD (coronary artery disease)   . Chronic diastolic heart failure ([place])   . Cirrhosis ([place]) 05/2016   secondary to NASH  . Diabetes mellitus with complication ([place]) 27/0350   A1c 6.8%  . Hyperlipemia, mixed    elevated LFTs when on statins.    . Hypertension    Cr bump 04/01/16 so I changed benicar-hct to benicar plain and added amlodipine 5 mg.  . Iron deficiency anemia 05/2016   Acute blood loss anemia: hospitalized, required transfusion  . Microscopic hematuria    Eval unremarkable by Dr. Eulogio [name].  [name] NASH (nonalcoholic steatohepatitis)    Fatty liver on MR abd 03/2014, + hx of elevated transaminases and bili: followed by Dr. Collene [name].  . Obesity     Past Surgical History:  Procedure Laterality Date  . CARDIAC CATHETERIZATION    . CARDIOVASCULAR STRESS TEST  01/17/2012; 05/04/16   Normal stress nuclear study x 2 (2018--Normal perfusion. LVEF 66% with normal wall motion. This is a low risk study).  . CERVICAL DISCECTOMY  1992  . COLONOSCOPY N/A 05/27/2016   No site/explanation for blood loss found.  Erythematous mucosa in cecum and ascending colon--Cecal bx: normal.  Procedure: COLONOSCOPY;  Surgeon: William Ada, MD;  Location: [facility] ENDOSCOPY;  Service: Endoscopy;  Laterality: N/A;  . COLONOSCOPY W/ POLYPECTOMY  09/10/2014   Polypectomy x 2: recall 5  yrs (Dr. Collene [name]).  . CORONARY ANGIOPLASTY    . CORONARY ARTERY BYPASS GRAFT  03/2006  . ESOPHAGOGASTRODUODENOSCOPY N/A 05/25/2016   No site/explanation for blood loss found.  Procedure: ESOPHAGOGASTRODUODENOSCOPY (EGD);  Surgeon: William Craver, MD;  Location: [facility] ENDOSCOPY;  Service: Endoscopy;  Laterality: N/A;  . GIVENS CAPSULE STUDY N/A 05/27/2016   Procedure: GIVENS CAPSULE STUDY;  Surgeon: William Ada, MD;  Location: [place];  Service: Endoscopy;  Laterality: N/A;  . TRANSTHORACIC ECHOCARDIOGRAM  05/25/2016   EF 60%, normal wall motion, grd  II DD, mild aortic stenosis, dilated aortic root and ascending aorta.  [name] VASECTOMY  1977    Current Medications: Outpatient Medications Prior to Visit  Medication Sig Dispense Refill  . fluticasone (FLONASE) 50 MCG/ACT nasal spray Place 2 sprays into both nostrils daily. (Patient taking differently: Place 2 sprays into both nostrils daily as needed for allergies. ) 16 g 6  . Garlic 7619 MG CAPS Take 1 capsule by mouth 2 (two) times daily.    . iron polysaccharides (NIFEREX) 150 MG capsule Take 1 capsule (150 mg total) by mouth daily. 30 capsule 0  . metFORMIN (GLUCOPHAGE) 500 MG tablet Take 1 tablet (500 mg total) by mouth 2 (two) times daily with a meal. 60 tablet 6  . metoprolol (LOPRESSOR) 25 MG tablet Take 0.5 tablets (12.5 mg total) by mouth 2 (two) times daily. 60 tablet 0  . omega-3 acid ethyl esters (LOVAZA) 1 g capsule TAKE 2 CAPSULES BY MOUTH ONCE DAILY (Patient taking differently: TAKE 1 CAPSULES BY MOUTH TWICE DAILY) 180 capsule 3  . potassium chloride SA (K-DUR,KLOR-CON) 20 MEQ tablet Take 1 tablet (20 mEq total) by mouth 2 (two) times daily. 60 tablet 0  . spironolactone (ALDACTONE) 25 MG tablet Take 1 tablet (25 mg total) by mouth daily. 30 tablet 0  . torsemide (DEMADEX) 20 MG tablet Take 2 tablets (40 mg total) by mouth daily. 30 tablet 0   No facility-administered medications prior to visit.      Allergies:   Statins   Social History   Social History  . Marital status: Married    Spouse name: N/A  . Number of children: N/A  . Years of education: N/A   Social History Main Topics  . Smoking status: Former Smoker    Types: Cigarettes  . Smokeless tobacco: Never Used     Comment: QUIT I 1983  . Alcohol use No  . Drug use: No  . Sexual activity: Not Asked   Other Topics Concern  . None   Social History Narrative   Married, 3 grown children, 3 GCs.   Educ: 10th grade.   Occupation: Retired Brewing technologist.   Tob: quit 1983, smoked about 20 pack-yr hx prior.    No alcohol.     Family History:  The patient's family history includes Cancer - Lung in his brother, father, and sister; Cancer - Other in his mother and sister; Diabetes in his father; Heart attack in his father; Heart disease in his brother, brother, and father; Hypertension in his brother and mother; Liver disease in his brother and sister; Mesothelioma in his brother.   ROS:   Please see the history of present illness.    ROS All other systems reviewed and are negative.   PHYSICAL EXAM:   VS:  BP 126/75   Pulse 90   Ht 5' 9"  (1.753 m)   Wt 250 lb 3.2 oz (113.5 kg)   SpO2 93% Comment: on RA  BMI  36.95 kg/m    GEN: Well nourished, well developed, in no acute distress  HEENT: normal  Neck: no JVD, carotid bruits, or masses Cardiac: RRR; no murmurs, rubs, or gallops,no edema  Respiratory:  clear to auscultation bilaterally, normal work of breathing GI: soft, nontender, nondistended, + BS MS: no deformity or atrophy  Skin: warm and dry, no rash Neuro:  Alert and Oriented x 3, Strength and sensation are intact Psych: euthymic mood, full affect  Wt Readings from Last 3 Encounters:  06/01/16 250 lb 3.2 oz (113.5 kg)  05/31/16 252 lb 4 oz (114.4 kg)  05/29/16 263 lb 11.2 oz (119.6 kg)      Studies/Labs Reviewed:   EKG:  EKG is not ordered today.    Recent Labs: 05/04/2016: TSH 1.19 05/26/2016: B Natriuretic Peptide 343.4; Magnesium 2.1 05/31/2016: ALT 23; BUN 11; Creatinine, Ser 1.08; Hemoglobin 11.4; Platelets 136.0; Potassium 3.6; Sodium 136   Lipid Panel    Component Value Date/Time   CHOL 204 (H) 04/01/2016 0952   CHOL 135 11/29/2013 0940   TRIG 119.0 04/01/2016 0952   TRIG 177 (H) 11/29/2013 0940   HDL 29.70 (L) 04/01/2016 0952   HDL 34 (L) 11/29/2013 0940   CHOLHDL 7 04/01/2016 0952   VLDL 23.8 04/01/2016 0952   LDLCALC 151 (H) 04/01/2016 0952   LDLCALC 66 11/29/2013 0940    Additional studies/ records that were reviewed today include:    Myoview  05/04/16  The left ventricular ejection fraction is hyperdynamic (>65%).  Nuclear stress EF: 66%.  No T wave inversion was noted during stress.  There was no ST segment deviation noted during stress.  This is a low risk study.  Normal perfusion. LVEF 66% with normal wall motion. This is a low risk study.   Echo 05/19/2016 LV EF: 60% -   65%  ------------------------------------------------------------------- Study Conclusions  - Left ventricle: The cavity size was normal. Wall thickness was   increased in a pattern of mild LVH. Systolic function was normal.   The estimated ejection fraction was in the range of 60% to 65%.   Wall motion was normal; there were no regional wall motion   abnormalities. Doppler parameters are consistent with   pseudonormal left ventricular relaxation (grade 2 diastolic   dysfunction). The E/e&' ratio is >15, suggesting elevated LV   filling pressure. - Aortic valve: Mildly calcified leaflets. Mild stenosis. Mean   gradient (S): 23 mm Hg. Peak gradient (S): 36 mm Hg. Valve area   (VTI): 1.77 cm^2. Valve area (Vmax): 1.82 cm^2. Valve area   (Vmean): 1.68 cm^2. - Aorta: Dilated aortic root and ascending aorta. Aortic root   dimension: 41 mm (ED). Ascending aortic diameter: 42 mm (S). - Mitral valve: Mildly thickened leaflets . There was trivial   regurgitation. - Left atrium: The atrium was mildly dilated. - Right atrium: The atrium was mildly dilated. - Tricuspid valve: There was mild regurgitation. - Pulmonary arteries: PA peak pressure: 28 mm Hg (S). - Inferior vena cava: The vessel was normal in size. The   respirophasic diameter changes were in the normal range (>= 50%),   consistent with normal central venous pressure.  Impressions:  - LVEF 60-65%, mild LVH, normal wall motion, grade 2 DD with   elevated LV filling pressure, mild aortic stenosis - AVA around   1.8 cm2 based on LVOT diameter of 2.2 cm, dilated aortic root to   4.1 cm  and ascending aorta to 4.2 cm, trivial MR, mild biatrial  enlargement, mild TR, RVSP 28 mmHg, normal IVC.   ASSESSMENT:    1. Chronic diastolic heart failure ([place])   2. Encounter for long-term (current) use of medications   3. Coronary artery disease involving coronary bypass graft of native heart without angina pectoris   4. Essential hypertension   5. Hyperlipidemia, unspecified hyperlipidemia type   6. Controlled type 2 diabetes mellitus without complication, without long-term current use of insulin ([place])   7. Anemia, unspecified type      PLAN:  In order of problems listed above:  1. Chronic diastolic heart failure: Appears to be euvolemic on physical exam. Continue torsemide and spironolactone. Recommend recheck CBC and a basic metabolic panel on his next follow-up with primary care physician.  2. CAD s/p CABG 03/2006: No obvious angina. Recent echocardiogram and a stress test reassuring.  3. Anemia: Upcoming follow-up was GI. Although he was guaiac-positive in the hospital, or source of bleeding was identified. His hemoglobin has increased since discharge. Yesterday's CBC shows hemoglobin has improved from 9.0 prior to discharge up to 11.4  4. Hypertension: Blood pressure stable on metoprolol titrate  5. Hyperlipidemia: He is not on any statin medication due to intolerance. We'll need to consider PCSK 9 inhibitor. Last LDL 151 on 04/01/2016  6. DM 2: Continue metformin    Medication Adjustments/Labs and Tests Ordered: Current medicines are reviewed at length with the patient today.  Concerns regarding medicines are outlined above.  Medication changes, Labs and Tests ordered today are listed in the Patient Instructions below. Patient Instructions  Heart failure prevention instruction:  1. Avoid salt 2. Limit daily fluid intake to < 2 liters 3. Weigh yourself every morning, call cardiology if weight increase by more than 3 lbs overnight or 5 lbs in a single week.  Labwork  requested --- BMET and CBC -- can by done at primary care office.  No changes to your medications  Follow up with Dr. Claiborne [name] in 3 months.    Hilbert Corrigan, [place]  06/02/2016 3:19 PM    [facility], [place], [place]  [address] Phone: [phone]; Fax: [phone]

## 2016-06-02 ENCOUNTER — Encounter: Payer: Self-pay | Admitting: Physician Assistant

## 2016-06-02 DIAGNOSIS — D509 Iron deficiency anemia, unspecified: Secondary | ICD-10-CM | POA: Diagnosis not present

## 2016-06-02 DIAGNOSIS — K7460 Unspecified cirrhosis of liver: Secondary | ICD-10-CM | POA: Diagnosis not present

## 2016-06-02 DIAGNOSIS — R945 Abnormal results of liver function studies: Secondary | ICD-10-CM | POA: Diagnosis not present

## 2016-06-09 ENCOUNTER — Ambulatory Visit: Payer: 59 | Admitting: Cardiovascular Disease

## 2016-06-14 MED FILL — TORSEMIDE 20 MG TABLET: 20 | 30 days supply | Qty: 30 | Fill #0

## 2016-06-21 ENCOUNTER — Encounter: Payer: Self-pay | Admitting: Family Medicine

## 2016-06-22 MED ORDER — POLYSACCHARIDE IRON COMPLEX 150 MG PO CAPS: 150.0000 mg | ORAL_CAPSULE | Freq: Every day | ORAL | 6 refills | Status: DC

## 2016-06-22 MED ORDER — POTASSIUM CHLORIDE CRYS ER 20 MEQ PO TBCR: 20.0000 meq | EXTENDED_RELEASE_TABLET | Freq: Two times a day (BID) | ORAL | 6 refills | Status: DC

## 2016-06-22 MED ORDER — TORSEMIDE 20 MG PO TABS: 40.0000 mg | ORAL_TABLET | Freq: Every day | ORAL | 6 refills | Status: DC

## 2016-06-22 MED ORDER — SPIRONOLACTONE 25 MG PO TABS: 25.0000 mg | ORAL_TABLET | Freq: Every day | ORAL | 6 refills | Status: DC

## 2016-06-22 MED ORDER — METOPROLOL TARTRATE 25 MG PO TABS: 12.5000 mg | ORAL_TABLET | Freq: Two times a day (BID) | ORAL | 6 refills | Status: DC

## 2016-06-22 MED FILL — SPIRONOLACTONE 25 MG TABLET: 25 | 30 days supply | Qty: 30 | Fill #0

## 2016-06-22 MED FILL — POTASSIUM CL ER 20 MEQ TABL: 20 | 30 days supply | Qty: 60 | Fill #0

## 2016-06-22 MED FILL — METOPROLOL TARTRATE 25 MG T: 25 | 60 days supply | Qty: 60 | Fill #0

## 2016-06-22 MED FILL — POLY-IRON 150 MG CAPSULE: 150 | 30 days supply | Qty: 30 | Fill #0

## 2016-06-22 NOTE — Telephone Encounter (Signed)
OK--meds sent in as requested. --PM

## 2016-06-22 NOTE — Telephone Encounter (Signed)
Please review and advise. Thanks.

## 2016-06-27 MED FILL — TORSEMIDE 20 MG TABLET: 20 | 30 days supply | Qty: 60 | Fill #0

## 2016-06-29 ENCOUNTER — Ambulatory Visit (INDEPENDENT_AMBULATORY_CARE_PROVIDER_SITE_OTHER): Payer: 59 | Admitting: Family Medicine

## 2016-06-29 ENCOUNTER — Encounter: Payer: Self-pay | Admitting: Family Medicine

## 2016-06-29 VITALS — BP 117/75 | HR 86 | Temp 98.1°F | Resp 16 | Ht 68.75 in | Wt 251.0 lb

## 2016-06-29 DIAGNOSIS — Z1159 Encounter for screening for other viral diseases: Secondary | ICD-10-CM | POA: Diagnosis not present

## 2016-06-29 DIAGNOSIS — E118 Type 2 diabetes mellitus with unspecified complications: Secondary | ICD-10-CM

## 2016-06-29 DIAGNOSIS — Z0000 Encounter for general adult medical examination without abnormal findings: Secondary | ICD-10-CM

## 2016-06-29 DIAGNOSIS — Z23 Encounter for immunization: Secondary | ICD-10-CM

## 2016-06-29 DIAGNOSIS — D649 Anemia, unspecified: Secondary | ICD-10-CM

## 2016-06-29 DIAGNOSIS — Z125 Encounter for screening for malignant neoplasm of prostate: Secondary | ICD-10-CM | POA: Diagnosis not present

## 2016-06-29 DIAGNOSIS — D500 Iron deficiency anemia secondary to blood loss (chronic): Secondary | ICD-10-CM | POA: Diagnosis not present

## 2016-06-29 DIAGNOSIS — Z9189 Other specified personal risk factors, not elsewhere classified: Secondary | ICD-10-CM | POA: Diagnosis not present

## 2016-06-29 DIAGNOSIS — I2510 Atherosclerotic heart disease of native coronary artery without angina pectoris: Secondary | ICD-10-CM

## 2016-06-29 LAB — IRON AND TIBC
%SAT: 18 % (ref 15–60)
Iron: 78 ug/dL (ref 50–180)
TIBC: 429 ug/dL — ABNORMAL HIGH (ref 250–425)
UIBC: 351 ug/dL

## 2016-06-29 LAB — CBC
HEMATOCRIT: 33.4 % — AB (ref 39.0–52.0)
HEMOGLOBIN: 11.3 g/dL — AB (ref 13.0–17.0)
MCHC: 33.7 g/dL (ref 30.0–36.0)
MCV: 85.2 fl (ref 78.0–100.0)
Platelets: 93 10*3/uL — ABNORMAL LOW (ref 150.0–400.0)
RBC: 3.93 Mil/uL — ABNORMAL LOW (ref 4.22–5.81)
RDW: 23.8 % — AB (ref 11.5–15.5)
WBC: 5.7 10*3/uL (ref 4.0–10.5)

## 2016-06-29 LAB — MICROALBUMIN / CREATININE URINE RATIO
Creatinine,U: 201.7 mg/dL
MICROALB UR: 0.9 mg/dL (ref 0.0–1.9)
Microalb Creat Ratio: 0.4 mg/g (ref 0.0–30.0)

## 2016-06-29 LAB — FERRITIN: Ferritin: 33.9 ng/mL (ref 22.0–322.0)

## 2016-06-29 LAB — IRON: IRON: 83 ug/dL (ref 42–165)

## 2016-06-29 LAB — HEMOGLOBIN A1C: Hgb A1c MFr Bld: 5.4 % (ref 4.6–6.5)

## 2016-06-29 LAB — PSA: PSA: 0.31 ng/mL (ref 0.10–4.00)

## 2016-06-29 NOTE — Addendum Note (Signed)
Addended by: Gordy Councilman on: 06/29/2016 09:49 AM   Modules accepted: Orders

## 2016-06-29 NOTE — Progress Notes (Signed)
Office Note 06/29/2016  CC:  Chief Complaint  Patient presents with  . Annual Exam    CPE    HPI:  William [name] is a 66 y.o. White male who is here accompanied by his wife for annual health maintenance exam.  Recent occult GI bleeding causing symptomatic anemia:  EGD and colonoscopy normal. Capsule endoscopy results showed a couple of erosions, possibly the cause of his anemia.  No acute complaints.  No abd pain. No melena or hematochezia, no diarrhea.  Wt : hovering around 235-237--I'd consider this his ideal wt ("dry" wt).  Home bp's normal to low normal.  Mild, brief dizziness upon standing, no presyncope. Glucoses over the last month: 100-120.  Eyes: most recent exam 01/2017. Dental preventatives q76mo  Exercise: starting to get more active, less sedentary over the last 1-2 weeks.   Diet: focusing on low fat, avoiding Na, avoiding simple sugars.  Increasing fruits.  Past Medical History:  Diagnosis Date  . AAA [place]) [place] 03/2014   3.2 cm on MR abd.  F/u aortic u/s 06/2014 showed 3.0 x 3.1 cm AAA: recheck 2 yrs recommended (followed by cardiologist)  . Bilateral renal cysts    simple (03/2014 MRI)  . CAD (coronary artery disease)   . Chronic diastolic heart failure ([place]   . Cirrhosis ([place] 05/2016   secondary to NASH  . Diabetes mellitus with complication ([place] 031/5400  A1c 6.8%  . Hyperlipemia, mixed    elevated LFTs when on statins.    . Hypertension    Cr bump 04/01/16 so I changed benicar-hct to benicar plain and added amlodipine 5 mg.  . Iron deficiency anemia 05/2016   Acute blood loss anemia: hospitalized, required transfusion  . Microscopic hematuria    Eval unremarkable by Dr. DEulogio Ditch  .Marland KitchenNASH (nonalcoholic steatohepatitis)    Fatty liver on MR abd 03/2014, + hx of elevated transaminases and bili: followed by Dr. MCollene Mares  . Obesity     Past Surgical History:  Procedure Laterality Date  . CARDIAC CATHETERIZATION    .  CARDIOVASCULAR STRESS TEST  01/17/2012; 05/04/16   Normal stress nuclear study x 2 (2018--Normal perfusion. LVEF 66% with normal wall motion. This is a low risk study).  . CERVICAL DISCECTOMY  1992  . COLONOSCOPY N/A 05/27/2016   No site/explanation for blood loss found.  Erythematous mucosa in cecum and ascending colon--Cecal bx: normal.  Procedure: COLONOSCOPY;  Surgeon: PCarol Ada MD;  [facility];  Service: Endoscopy;  Laterality: N/A;  . COLONOSCOPY W/ POLYPECTOMY  09/10/2014   Polypectomy x 2: recall 5 yrs (Dr. MCollene Mares.  . CORONARY ANGIOPLASTY    . CORONARY ARTERY BYPASS GRAFT  03/2006  . ESOPHAGOGASTRODUODENOSCOPY N/A 05/25/2016   No site/explanation for blood loss found.  Procedure: ESOPHAGOGASTRODUODENOSCOPY (EGD);  Surgeon: JJuanita Craver MD;  [facility];  Service: Endoscopy;  Laterality: N/A;  . GIVENS CAPSULE STUDY N/A 05/27/2016   Procedure: GIVENS CAPSULE STUDY;  Surgeon: PCarol Ada MD;  [place]  Service: Endoscopy;  Laterality: N/A;  . TRANSTHORACIC ECHOCARDIOGRAM  05/25/2016   EF 60%, normal wall motion, grd II DD, mild aortic stenosis, dilated aortic root and ascending aorta.  .Marland KitchenVASECTOMY  1977    Family History  Problem Relation Age of Onset  . Hypertension Mother   . Cancer - Other Mother        liver cancer  . Heart disease Father   . Heart attack Father   . Cancer -  Lung Father   . Diabetes Father   . Liver disease Sister   . Heart disease Brother        CABG 20 YEARS AGO  . Hypertension Brother   . Mesothelioma Brother        HALF-BROTHER  . Cancer - Lung Brother   . Cancer - Lung Sister        HALF-SISTER  . Cancer - Other Sister        KIDNEY  (HALF-SISTER)  . Liver disease Brother   . Heart disease Brother        CABG-2012    Social History   Social History  . Marital status: Married    Spouse name: N/A  . Number of children: N/A  . Years of education: N/A   Occupational History  . Not on file.    Social History Main Topics  . Smoking status: Former Smoker    Types: Cigarettes  . Smokeless tobacco: Never Used     Comment: QUIT I 1983  . Alcohol use No  . Drug use: No  . Sexual activity: Not on file   Other Topics Concern  . Not on file   Social History Narrative   Married, 3 grown children, 3 GCs.   Educ: 10th grade.   Occupation: Retired Brewing technologist.   Tob: quit 1983, smoked about 20 pack-yr hx prior.   No alcohol.    Outpatient Medications Prior to Visit  Medication Sig Dispense Refill  . fluticasone (FLONASE) 50 MCG/ACT nasal spray Place 2 sprays into both nostrils daily. (Patient taking differently: Place 2 sprays into both nostrils daily as needed for allergies. ) 16 g 6  . Garlic 5638 MG CAPS Take 1 capsule by mouth 2 (two) times daily.    . iron polysaccharides (NIFEREX) 150 MG capsule Take 1 capsule (150 mg total) by mouth daily. 30 capsule 6  . metFORMIN (GLUCOPHAGE) 500 MG tablet Take 1 tablet (500 mg total) by mouth 2 (two) times daily with a meal. 60 tablet 6  . metoprolol tartrate (LOPRESSOR) 25 MG tablet Take 0.5 tablets (12.5 mg total) by mouth 2 (two) times daily. 60 tablet 6  . omega-3 acid ethyl esters (LOVAZA) 1 g capsule TAKE 2 CAPSULES BY MOUTH ONCE DAILY (Patient taking differently: TAKE 1 CAPSULES BY MOUTH TWICE DAILY) 180 capsule 3  . potassium chloride SA (K-DUR,KLOR-CON) 20 MEQ tablet Take 1 tablet (20 mEq total) by mouth 2 (two) times daily. 60 tablet 6  . spironolactone (ALDACTONE) 25 MG tablet Take 1 tablet (25 mg total) by mouth daily. 30 tablet 6  . torsemide (DEMADEX) 20 MG tablet Take 2 tablets (40 mg total) by mouth daily. 60 tablet 6   No facility-administered medications prior to visit.     Allergies  Allergen Reactions  . Statins Other (See Comments)    Liver enzymes go up whenever on them    ROS Review of Systems  Constitutional: Negative for appetite change, chills, fatigue and fever.  HENT: Negative for congestion,  dental problem, ear pain and sore throat.   Eyes: Negative for discharge, redness and visual disturbance.  Respiratory: Negative for cough, chest tightness, shortness of breath and wheezing.   Cardiovascular: Negative for chest pain, palpitations and leg swelling.  Gastrointestinal: Negative for abdominal pain, blood in stool, diarrhea, nausea and vomiting.  Genitourinary: Negative for difficulty urinating, dysuria, flank pain, frequency, hematuria and urgency.  Musculoskeletal: Negative for arthralgias, back pain, joint swelling, myalgias and neck stiffness.  Skin: Negative for pallor and rash.  Neurological: Negative for dizziness, speech difficulty, weakness and headaches.  Hematological: Negative for adenopathy. Does not bruise/bleed easily.  Psychiatric/Behavioral: Negative for confusion and sleep disturbance. The patient is not nervous/anxious.     PE; Blood pressure 117/75, pulse 86, temperature 98.1 F (36.7 C), temperature source Oral, resp. rate 16, height 5' 8.75" (1.746 m), weight 251 lb (113.9 kg), SpO2 96 %. Gen: Alert, well appearing.  Patient is oriented to person, place, time, and situation. AFFECT: pleasant, lucid thought and speech. ENT: Ears: EACs clear, normal epithelium.  TMs with good light reflex and landmarks bilaterally.  Eyes: no injection, icteris, swelling, or exudate.  EOMI, PERRLA. Nose: no drainage or turbinate edema/swelling.  No injection or focal lesion.  Mouth: lips without lesion/swelling.  Oral mucosa pink and moist.  Dentition intact and without obvious caries or gingival swelling.  Oropharynx without erythema, exudate, or swelling.  Neck: supple/nontender.  No LAD, mass, or TM.  Carotid pulses 2+ bilaterally, without bruits. CV: RRR, 2-9/5 systolic murmur, S1 obscured, S2 clear.  No diastolic murmur, no /r/g.   LUNGS: CTA bilat, nonlabored resps, good aeration in all lung fields. ABD: soft, NT, ND, BS normal.  No hepatospenomegaly or mass.  Faint  supraumbilical bruit vs transmitted heart murmur.   EXT: no clubbing, cyanosis, or edema.  Musculoskeletal: no joint swelling, erythema, warmth, or tenderness.   Skin - no sores or suspicious lesions or rashes or color changes Rectal exam: negative without mass, lesions or tenderness, PROSTATE EXAM: smooth and symmetric without nodules or tenderness.  Pertinent labs:  Lab Results  Component Value Date   TSH 1.19 05/04/2016   Lab Results  Component Value Date   WBC 6.9 05/31/2016   HGB 11.4 (L) 05/31/2016   HCT 35.0 (L) 05/31/2016   MCV 83.2 05/31/2016   PLT 136.0 (L) 05/31/2016   Lab Results  Component Value Date   IRON 21 (L) 05/22/2016   TIBC 486 (H) 05/22/2016   FERRITIN 8 (L) 05/22/2016    Lab Results  Component Value Date   CREATININE 1.08 05/31/2016   BUN 11 05/31/2016   NA 136 05/31/2016   K 3.6 05/31/2016   CL 99 05/31/2016   CO2 29 05/31/2016   Lab Results  Component Value Date   ALT 23 05/31/2016   AST 31 05/31/2016   ALKPHOS 78 05/31/2016   BILITOT 4.5 (H) 05/31/2016   Lab Results  Component Value Date   CHOL 204 (H) 04/01/2016   Lab Results  Component Value Date   HDL 29.70 (L) 04/01/2016   Lab Results  Component Value Date   LDLCALC 151 (H) 04/01/2016   Lab Results  Component Value Date   TRIG 119.0 04/01/2016   Lab Results  Component Value Date   CHOLHDL 7 04/01/2016   Lab Results  Component Value Date   HGBA1C 6.7 (H) 04/01/2016   ASSESSMENT AND PLAN:   1) DM 2: good home glucoses. Urine microalb/cr today. Eye exam UTD. HbA1c today.  2) HTN: The current medical regimen is effective;  continue present plan and medications. Lytes/cr good 1 month ago.  3) Iron def anemia due to chronic GI blood loss: presumed POSSIBLY from SB erosions noted on capsule endoscopy.  Continue oral iron, check CBC and iron studies again today.  4) Health maintenance exam: Reviewed age and gender appropriate health maintenance issues (prudent diet,  regular exercise, health risks of tobacco and excessive alcohol, use of seatbelts, fire alarms in  home, use of sunscreen).  Also reviewed age and gender appropriate health screening as well as vaccine recommendations. Tdap today. Hep C screening obtained today. Prostate ca screening: DRE normal today , PSA drawn. Colon ca screening: most recent colonoscopy 2018--no polyps.  An After Visit Summary was printed and given to the patient.  FOLLOW UP:  Return in about 3 months (around 09/29/2016) for routine chronic illness f/u.  Signed:  Crissie Sickles, MD           06/29/2016

## 2016-06-30 ENCOUNTER — Encounter: Payer: Self-pay | Admitting: *Deleted

## 2016-06-30 LAB — HEPATITIS C ANTIBODY: HCV Ab: NEGATIVE

## 2016-07-11 NOTE — Addendum Note (Signed)
Addendum  created 07/11/16 1413 by Oleta Mouse, MD   Sign clinical note

## 2016-07-12 MED FILL — OMEGA-3 ETHYL ESTERS 1 GM C: 1 | 90 days supply | Qty: 180 | Fill #1

## 2016-07-25 MED FILL — SPIRONOLACTONE 25 MG TABLET: 25 | 30 days supply | Qty: 30 | Fill #1

## 2016-07-25 MED FILL — POTASSIUM CL ER 20 MEQ TABL: 20 | 30 days supply | Qty: 60 | Fill #1

## 2016-07-25 MED FILL — POLY-IRON 150 MG CAPSULE: 150 | 30 days supply | Qty: 30 | Fill #1

## 2016-07-25 MED FILL — TORSEMIDE 20 MG TABLET: 20 | 30 days supply | Qty: 60 | Fill #1

## 2016-07-26 MED FILL — metFORMIN HCL 500 MG TABS: 500 | 30 days supply | Qty: 60 | Fill #0

## 2016-07-27 ENCOUNTER — Encounter: Payer: Self-pay | Admitting: Family Medicine

## 2016-07-28 ENCOUNTER — Ambulatory Visit (INDEPENDENT_AMBULATORY_CARE_PROVIDER_SITE_OTHER): Payer: 59 | Admitting: Cardiology

## 2016-07-28 ENCOUNTER — Encounter: Payer: Self-pay | Admitting: Cardiology

## 2016-07-28 ENCOUNTER — Telehealth: Payer: Self-pay | Admitting: Physician Assistant

## 2016-07-28 ENCOUNTER — Ambulatory Visit: Payer: Self-pay | Admitting: Family Medicine

## 2016-07-28 ENCOUNTER — Other Ambulatory Visit: Payer: Self-pay | Admitting: *Deleted

## 2016-07-28 ENCOUNTER — Encounter: Payer: Self-pay | Admitting: *Deleted

## 2016-07-28 ENCOUNTER — Telehealth: Payer: Self-pay | Admitting: Family Medicine

## 2016-07-28 VITALS — BP 120/62 | HR 85 | Ht 69.0 in | Wt 247.8 lb

## 2016-07-28 DIAGNOSIS — I5032 Chronic diastolic (congestive) heart failure: Secondary | ICD-10-CM

## 2016-07-28 DIAGNOSIS — I10 Essential (primary) hypertension: Secondary | ICD-10-CM | POA: Diagnosis not present

## 2016-07-28 DIAGNOSIS — R531 Weakness: Secondary | ICD-10-CM | POA: Diagnosis not present

## 2016-07-28 DIAGNOSIS — Z8719 Personal history of other diseases of the digestive system: Secondary | ICD-10-CM

## 2016-07-28 DIAGNOSIS — I350 Nonrheumatic aortic (valve) stenosis: Secondary | ICD-10-CM | POA: Diagnosis not present

## 2016-07-28 DIAGNOSIS — Z951 Presence of aortocoronary bypass graft: Secondary | ICD-10-CM | POA: Diagnosis not present

## 2016-07-28 NOTE — Patient Instructions (Signed)
Medication Instructions:  Your physician recommends that you continue on your current medications as directed. Please refer to the Current Medication list given to you today.  Labwork: Your physician recommends that you return for lab work in: TODAY-STAT-bmp, bnp, cbc   Testing/Procedures: NONE   Follow-Up: [place].  Any Other Special Instructions Will Be Listed Below (If Applicable).  If you need a refill on your cardiac medications before your next appointment, please call your pharmacy.

## 2016-07-28 NOTE — Assessment & Plan Note (Signed)
CABG 2008, Myoview low risk April 2018 No chest pain

## 2016-07-28 NOTE — Patient Outreach (Addendum)
[facility]) Care Management  07/28/2016  William [name] [birth year] [id number]   Subjective: Telephone call from patient's wife William [name]), states she is calling regarding her husband William [name]) due to an urgent situation, and remembers speaking with this RNCM in the past.  Per patient's previous verbal consent, RNCM can speak with patient's wife regarding patient's healthcare needs as needed.  Wife stated patient's name, date of birth, and address. Wife states patient has not been feeling well for the past 4 days, very fatigue, blood pressure this morning is 100/42, has a history of anemia, is nauseated, eating a lot of ice, she has communicated patient's symptoms to primary MD's office this morning, MD's office initially scheduled an appointment for 11:00am today,  then patient received a call back from MD's office canceling the appointment, and requested  patient to go to the ED instead of coming to the appointment.   Wife states she is a Marine scientist, feels something is wrong with her husband, wants him evaluated by the primary MD or cardiologist prior to going to the ED, because she does not feel the ED is the appropriate first step, and it is the  least time efficient step prompt treatment.    States the primary MD's office is approximately 15 minutes from the patient, the ED and the cardiologist are  30 minutes from patient.  States she has also contacted the cardiologist office in [place] and is waiting for call back.    Discussed wife's concerns, advised wife to call primary MD's office to discuss her concerns and determine appropriate follow up with primary MD's office.   Wife voices understanding, states she will follow up with primary MD's office,  and cardiologist prior to going to ED if appropriate.  Wife states patient does not have any transition of care, care coordination, disease management, disease monitoring, transportation, community resource, or pharmacy needs  at this time.   States she is very Patent attorney of RNCM and will call RNCM back if further assistance needed.   Objective:Per chart review, patient hospitalized 05/22/16 - 05/29/16 for gastrointestinal bleed. Patient also has a history of Acute blood loss anemia, Hyperbilirubinemia, Liver Cirrhoses/NAFLD (nonalcoholic fatty liver disease), Acute on chronic diastolic heart failure, CAD, hypertension, diabetes, and status post CABG in 2008.    Assessment:Received [facility] Consult referral on 07/28/16 via patient's wife.  [facility] Consult follow up completed, no care management needs, and will proceed with case closure.   Plan: RNCM will send patient successful outreach letter, [facility] pamphlet, and magnet. RNCM will send case closure due to follow up completed / no care management needs request to [facility].     Melchor Kirchgessner H. Annia Friendly, BSN, [place] Management [facility] Telephonic CM Phone: [phone] Fax: [phone]

## 2016-07-28 NOTE — Telephone Encounter (Signed)
FYI

## 2016-07-28 NOTE — Assessment & Plan Note (Signed)
Pt's wife called. She says her husband is weak, tired, just not doing well and wanted him to be seen

## 2016-07-28 NOTE — Assessment & Plan Note (Signed)
BP well controlled.

## 2016-07-28 NOTE — Telephone Encounter (Signed)
SW Dr. Anitra Lauth, he has reviewed this note. Please call pt and have them go to ER. Thanks.

## 2016-07-28 NOTE — Assessment & Plan Note (Signed)
His AS and MR sound more than mild on exam- possibly secondary to anemia

## 2016-07-28 NOTE — Assessment & Plan Note (Signed)
Echo April 2018- EF 60-65% with grade 2 DD No obvious CHF on exam though she notes his wgt is up 4 lbs

## 2016-07-28 NOTE — Telephone Encounter (Signed)
Noted. Agree.

## 2016-07-28 NOTE — Progress Notes (Signed)
07/28/2016 William [name]   1951/01/18  [id number]  Primary Physician McGowen, Adrian Blackwater, MD Primary Cardiologist: Dr Claiborne Billings  HPI:  66 y.o. male with PMH of CAD s/p CABG 07/7617, chronic diastolic heart failure, h/o NASH cirrhosis, DM II, hyperlipidemia, and hypertension. He had a recent Myoview in March 2018 that was low risk. He was admitted to [facility] in April 2018 with symptomatic anemia requiring transfusion. He had demand ischemia by Troponin. Echocardiogram obtained on 05/19/2016 showed EF 60-65%, mild LVH, grade 2 DD, 42 mm ascending aortic root diameter. His EGD and colonoscopy did not identify an obvious source of bleeding, but he apparently had had some erosions on capsule endoscopy. He was diuresed and seen in the office in f/u 06/01/16.  Hgb was 11.3, K+ 3.6, BUN 11, SCr 1.08. He was doing well. He continued to diureses to about 240 lbs as an OP.   His wife called today(she worked at [employer] on the renal unit). She was concerned, she said her husband was just not right. He was weak mainly. She wanted to know if he could be seen and he was added to my schedule. She gives the history for him. She reports several issues, weakness, fatigue, anorexia, not sleeping well, all very general complaints. She was concerned he may be volume overloaded as his wgt is up 4 lbs from his LOV with his PCP. He denies any rest dyspnea or chest pain.    Current Outpatient Prescriptions  Medication Sig Dispense Refill  . fluticasone (FLONASE) 50 MCG/ACT nasal spray Place 2 sprays into both nostrils daily. (Patient taking differently: Place 2 sprays into both nostrils daily as needed for allergies. ) 16 g 6  . Garlic 5093 MG CAPS Take 1 capsule by mouth 2 (two) times daily.    . iron polysaccharides (NIFEREX) 150 MG capsule Take 1 capsule (150 mg total) by mouth daily. 30 capsule 6  . metFORMIN (GLUCOPHAGE) 500 MG tablet Take 1 tablet (500 mg total) by mouth 2 (two) times daily with a meal. (Patient taking differently:  Take 500 mg by mouth daily with breakfast. ) 60 tablet 6  . metoprolol tartrate (LOPRESSOR) 25 MG tablet Take 0.5 tablets (12.5 mg total) by mouth 2 (two) times daily. 60 tablet 6  . omega-3 acid ethyl esters (LOVAZA) 1 g capsule TAKE 2 CAPSULES BY MOUTH ONCE DAILY (Patient taking differently: TAKE 1 CAPSULES BY MOUTH TWICE DAILY) 180 capsule 3  . potassium chloride SA (K-DUR,KLOR-CON) 20 MEQ tablet Take 1 tablet (20 mEq total) by mouth 2 (two) times daily. 60 tablet 6  . spironolactone (ALDACTONE) 25 MG tablet Take 1 tablet (25 mg total) by mouth daily. 30 tablet 6  . torsemide (DEMADEX) 20 MG tablet Take 2 tablets (40 mg total) by mouth daily. 60 tablet 6   No current facility-administered medications for this visit.     Allergies  Allergen Reactions  . Statins Other (See Comments)    Liver enzymes go up whenever on them    Past Medical History:  Diagnosis Date  . AAA (abdominal aortic aneurysm) ([place]) 03/2014   3.2 cm on MR abd.  F/u aortic u/s 06/2014 showed 3.0 x 3.1 cm AAA: recheck 2 yrs recommended (followed by cardiologist)  . Bilateral renal cysts    simple (03/2014 MRI)  . CAD (coronary artery disease)   . Chronic diastolic heart failure ([place])   . Cirrhosis ([place]) 05/2016   secondary to NASH  . Diabetes mellitus with complication ([place]) 26/7124  A1c 6.8%  . Hyperlipemia, mixed    elevated LFTs when on statins.    . Hypertension    Cr bump 04/01/16 so I changed benicar-hct to benicar plain and added amlodipine 5 mg.  . Iron deficiency anemia 05/2016   Acute blood loss anemia: hospitalized, required transfusion  . Microscopic hematuria    Eval unremarkable by Dr. Eulogio Ditch.  [name] NASH (nonalcoholic steatohepatitis)    Fatty liver on MR abd 03/2014, + hx of elevated transaminases and bili: followed by Dr. [name].  . Obesity     Social History   Social History  . Marital status: Married    Spouse name: N/A  . Number of children: N/A  . Years of education: N/A    Occupational History  . Not on file.   Social History Main Topics  . Smoking status: Former Smoker    Types: Cigarettes  . Smokeless tobacco: Never Used     Comment: QUIT I 1983  . Alcohol use No  . Drug use: No  . Sexual activity: Not on file   Other Topics Concern  . Not on file   Social History Narrative   Married, 3 grown children, 3 GCs.   Educ: 10th grade.   Occupation: Retired Brewing technologist.   Tob: quit 1983, smoked about 20 pack-yr hx prior.   No alcohol.     Family History  Problem Relation Age of Onset  . Hypertension Mother   . Cancer - Other Mother        liver cancer  . Heart disease Father   . Heart attack Father   . Cancer - Lung Father   . Diabetes Father   . Liver disease Sister   . Heart disease Brother        CABG 20 YEARS AGO  . Hypertension Brother   . Mesothelioma Brother        HALF-BROTHER  . Cancer - Lung Brother   . Cancer - Lung Sister        HALF-SISTER  . Cancer - Other Sister        KIDNEY  (HALF-SISTER)  . Liver disease Brother   . Heart disease Brother        CABG-2012     Review of Systems: General: negative for chills, fever, night sweats or weight changes.  Cardiovascular: negative for chest pain, dyspnea on exertion, edema, orthopnea, palpitations, paroxysmal nocturnal dyspnea or shortness of breath Dermatological: negative for rash Respiratory: negative for cough or wheezing Urologic: negative for hematuria Abdominal: negative for nausea, vomiting, diarrhea, bright red blood per rectum, melena, or hematemesis Neurologic: negative for visual changes, syncope, or dizziness All other systems reviewed and are otherwise negative except as noted above.    Blood pressure 120/62, pulse 85, height 5' 9"  (1.753 m), weight 247 lb 12.8 oz (112.4 kg), SpO2 94 %.  General appearance: alert, cooperative and no distress Neck: no JVD and transmitted murmur Lungs: clear to auscultation bilaterally Heart: regular rate and rhythm  and 2/6 AS murmur with S2, 2/6 MR murmur Abdomen: soft, pale, non tender Extremities: no edema Skin: pale cool dry Neurologic: Grossly normal  EKG NSR, incomplete RBBB, QTc 509  ASSESSMENT AND PLAN:   Weakness Pt's wife called. She says her husband is weak, tired, just not doing well and wanted him to be seen  Chronic diastolic heart failure [facility]) Echo April 2018- EF 60-65% with grade 2 DD No obvious CHF on exam though she notes his wgt is up  4 lbs  Hx of CABG CABG 2008, Myoview low risk April 2018 No chest pain  Essential hypertension B/P well controlled  Non-insulin treated type 2 diabetes mellitus ([place]) On oral agent   History of GI bleed Anemia requiring transfusion April 2018- presumed secondary to GI bleeding though no obvious source on endoscopy or colonoscopy, some erosions on capsul endoscopy   PLAN  Mr Gendreau does not appear to be volume overloaded on exam. I'm more concerned he may be anemic and possibly mildly dehydrated. I ordered STAT CBC, BMP, and BNP. Further recommendations following lab results.   Kerin Ransom PA-C 07/28/2016 2:07 PM

## 2016-07-28 NOTE — Telephone Encounter (Signed)
Spoke with pt wife, she reports the patient started feeling bad about 4 days ago. His weight is up 4 lbs this week and 9 lbs over the month.he has SOB with exertion esp uphill or with distance. His bp is 100/42 and pulse 80. He feels weak, restless at night, c/o palpitations and aching across his shoulders. She reports this is how he felt prior to his last admission. She spoke with the medical doctor who suggested ER but she is trying to avoid that with an appointment today or tomorrow. Patient will be seen today at 1:30 pm by APP.

## 2016-07-28 NOTE — Telephone Encounter (Signed)
Pt is having increasing shortness of breath,aching in his shoulder,really tired,no energy at all. Pt weight is up 4lbs this week and nine lbs in the last month. Pt is also feeling nauseated,not much appetite.Pt's blood pressure is low can not sleep,legs aching and restless.Pt just does not feel good,Pt's wife thinks he needs to be seen.

## 2016-07-28 NOTE — Assessment & Plan Note (Signed)
On oral agent

## 2016-07-28 NOTE — Assessment & Plan Note (Signed)
Anemia requiring transfusion April 2018- presumed secondary to GI bleeding though no obvious source on endoscopy or colonoscopy, some erosions on capsul endoscopy

## 2016-07-28 NOTE — Telephone Encounter (Signed)
Spoke with patient regarding symptoms. Reports SOB and fatigue when walking x 2 days. Reports aching and edematous legs as well as numbness in shoulder radiating in his back. Denies chest pain. Advised patient to go to the Emergency Department for evaluation/treatment. Patient verbalized understanding and is agreeable, plans to go to [facility] ED.

## 2016-07-28 NOTE — Telephone Encounter (Signed)
Patient Name: William [name]  DOB: 11/09/1950    Initial Comment Patient hasn't felt well over the last 4 days, nauseated, aching in his legs, not sleeping well, shortness of breath, weakness and increase in weight. HX of CHF.    Nurse Assessment  Nurse: Mallie Mussel, RN, Alveta Heimlich Date/Time Eilene Ghazi Time): 07/28/2016 8:37:32 AM  Confirm and document reason for call. If symptomatic, describe symptoms. ---Caller states that her husband has some SOB with exertion and has gained some weight. He has gained about 4lbs in a week. Since he last saw the doctor, he has gained about 9lbs. That was back in May. He does have some edema to both of his ankles. One does not look larger than the other.  Does the patient have any new or worsening symptoms? ---Yes  Will a triage be completed? ---Yes  Related visit to physician within the last 2 weeks? ---No  Does the PT have any chronic conditions? (i.e. diabetes, asthma, etc.) ---Yes  List chronic conditions. ---CHF, HTN, Hypercholesterolemia  Is this a behavioral health or substance abuse call? ---No     Guidelines    Guideline Title Affirmed Question Affirmed Notes  Breathing Difficulty [1] MILD difficulty breathing (e.g., minimal/no SOB at rest, SOB with walking, pulse <100) AND [2] NEW-onset or WORSE than normal    Final Disposition User   See Physician within 4 Hours (or PCP triage) Mallie Mussel, RN, Alveta Heimlich    Comments  SOB has been worse since he was last in the hospital.  Appointment scheduled with Dr. Crissie Sickles for this morning at 11:15am.   Referrals  REFERRED TO PCP OFFICE   Disagree/Comply: Comply

## 2016-07-29 ENCOUNTER — Telehealth: Payer: Self-pay | Admitting: Hematology

## 2016-07-29 ENCOUNTER — Encounter (HOSPITAL_COMMUNITY): Payer: Self-pay | Admitting: Emergency Medicine

## 2016-07-29 ENCOUNTER — Observation Stay (HOSPITAL_COMMUNITY): Payer: 59

## 2016-07-29 ENCOUNTER — Telehealth: Payer: Self-pay | Admitting: *Deleted

## 2016-07-29 ENCOUNTER — Inpatient Hospital Stay (HOSPITAL_COMMUNITY)
Admission: EM | Admit: 2016-07-29 | Discharge: 2016-08-01 | DRG: 812 | Disposition: A | Discharge status: Home / Self Care | Payer: 59 | Attending: Internal Medicine | Admitting: Internal Medicine

## 2016-07-29 ENCOUNTER — Emergency Department (HOSPITAL_COMMUNITY): Payer: 59

## 2016-07-29 DIAGNOSIS — I8500 Esophageal varices without bleeding: Secondary | ICD-10-CM | POA: Diagnosis not present

## 2016-07-29 DIAGNOSIS — I5032 Chronic diastolic (congestive) heart failure: Secondary | ICD-10-CM | POA: Diagnosis present

## 2016-07-29 DIAGNOSIS — I10 Essential (primary) hypertension: Secondary | ICD-10-CM | POA: Diagnosis present

## 2016-07-29 DIAGNOSIS — Z888 Allergy status to other drugs, medicaments and biological substances status: Secondary | ICD-10-CM

## 2016-07-29 DIAGNOSIS — K3189 Other diseases of stomach and duodenum: Secondary | ICD-10-CM | POA: Diagnosis present

## 2016-07-29 DIAGNOSIS — Z8719 Personal history of other diseases of the digestive system: Secondary | ICD-10-CM | POA: Diagnosis not present

## 2016-07-29 DIAGNOSIS — I5033 Acute on chronic diastolic (congestive) heart failure: Secondary | ICD-10-CM | POA: Diagnosis present

## 2016-07-29 DIAGNOSIS — I110 Hypertensive heart disease with heart failure: Secondary | ICD-10-CM | POA: Diagnosis present

## 2016-07-29 DIAGNOSIS — Z79899 Other long term (current) drug therapy: Secondary | ICD-10-CM

## 2016-07-29 DIAGNOSIS — K7460 Unspecified cirrhosis of liver: Secondary | ICD-10-CM | POA: Diagnosis present

## 2016-07-29 DIAGNOSIS — R161 Splenomegaly, not elsewhere classified: Secondary | ICD-10-CM | POA: Diagnosis not present

## 2016-07-29 DIAGNOSIS — Z7984 Long term (current) use of oral hypoglycemic drugs: Secondary | ICD-10-CM

## 2016-07-29 DIAGNOSIS — E119 Type 2 diabetes mellitus without complications: Secondary | ICD-10-CM

## 2016-07-29 DIAGNOSIS — E669 Obesity, unspecified: Secondary | ICD-10-CM | POA: Diagnosis present

## 2016-07-29 DIAGNOSIS — I2510 Atherosclerotic heart disease of native coronary artery without angina pectoris: Secondary | ICD-10-CM | POA: Diagnosis not present

## 2016-07-29 DIAGNOSIS — K7581 Nonalcoholic steatohepatitis (NASH): Secondary | ICD-10-CM | POA: Diagnosis present

## 2016-07-29 DIAGNOSIS — R531 Weakness: Secondary | ICD-10-CM

## 2016-07-29 DIAGNOSIS — N281 Cyst of kidney, acquired: Secondary | ICD-10-CM | POA: Diagnosis present

## 2016-07-29 DIAGNOSIS — D509 Iron deficiency anemia, unspecified: Principal | ICD-10-CM | POA: Diagnosis present

## 2016-07-29 DIAGNOSIS — D649 Anemia, unspecified: Secondary | ICD-10-CM | POA: Diagnosis present

## 2016-07-29 DIAGNOSIS — Z87891 Personal history of nicotine dependence: Secondary | ICD-10-CM

## 2016-07-29 DIAGNOSIS — D696 Thrombocytopenia, unspecified: Secondary | ICD-10-CM | POA: Diagnosis not present

## 2016-07-29 DIAGNOSIS — Z951 Presence of aortocoronary bypass graft: Secondary | ICD-10-CM

## 2016-07-29 DIAGNOSIS — K766 Portal hypertension: Secondary | ICD-10-CM | POA: Diagnosis present

## 2016-07-29 DIAGNOSIS — K922 Gastrointestinal hemorrhage, unspecified: Secondary | ICD-10-CM | POA: Diagnosis not present

## 2016-07-29 DIAGNOSIS — R0602 Shortness of breath: Secondary | ICD-10-CM | POA: Diagnosis not present

## 2016-07-29 DIAGNOSIS — Z6837 Body mass index (BMI) 37.0-37.9, adult: Secondary | ICD-10-CM

## 2016-07-29 DIAGNOSIS — E782 Mixed hyperlipidemia: Secondary | ICD-10-CM | POA: Diagnosis present

## 2016-07-29 DIAGNOSIS — K8020 Calculus of gallbladder without cholecystitis without obstruction: Secondary | ICD-10-CM | POA: Diagnosis present

## 2016-07-29 DIAGNOSIS — I714 Abdominal aortic aneurysm, without rupture: Secondary | ICD-10-CM | POA: Diagnosis not present

## 2016-07-29 DIAGNOSIS — I350 Nonrheumatic aortic (valve) stenosis: Secondary | ICD-10-CM

## 2016-07-29 DIAGNOSIS — Z8249 Family history of ischemic heart disease and other diseases of the circulatory system: Secondary | ICD-10-CM

## 2016-07-29 DIAGNOSIS — D500 Iron deficiency anemia secondary to blood loss (chronic): Secondary | ICD-10-CM | POA: Diagnosis not present

## 2016-07-29 DIAGNOSIS — E668 Other obesity: Secondary | ICD-10-CM | POA: Diagnosis present

## 2016-07-29 DIAGNOSIS — Z833 Family history of diabetes mellitus: Secondary | ICD-10-CM

## 2016-07-29 LAB — COMPREHENSIVE METABOLIC PANEL
ALT: 30 U/L (ref 17–63)
ANION GAP: 6 (ref 5–15)
AST: 44 U/L — AB (ref 15–41)
Albumin: 2.7 g/dL — ABNORMAL LOW (ref 3.5–5.0)
Alkaline Phosphatase: 79 U/L (ref 38–126)
BILIRUBIN TOTAL: 3.4 mg/dL — AB (ref 0.3–1.2)
BUN: 10 mg/dL (ref 6–20)
CHLORIDE: 103 mmol/L (ref 101–111)
CO2: 25 mmol/L (ref 22–32)
Calcium: 8.6 mg/dL — ABNORMAL LOW (ref 8.9–10.3)
Creatinine, Ser: 1.08 mg/dL (ref 0.61–1.24)
GFR calc Af Amer: >60 mL/min (ref >60)
GLUCOSE: 119 mg/dL — AB (ref 65–99)
POTASSIUM: 3.9 mmol/L (ref 3.5–5.1)
Sodium: 134 mmol/L — ABNORMAL LOW (ref 135–145)
TOTAL PROTEIN: 6.3 g/dL — AB (ref 6.5–8.1)

## 2016-07-29 LAB — BASIC METABOLIC PANEL
BUN/Creatinine Ratio: 12 (ref 10–24)
BUN: 13 mg/dL (ref 8–27)
CO2: 23 mmol/L (ref 20–29)
Calcium: 9 mg/dL (ref 8.6–10.2)
Chloride: 100 mmol/L (ref 96–106)
Creatinine, Ser: 1.08 mg/dL (ref 0.76–1.27)
GFR calc Af Amer: 82 mL/min/{1.73_m2} (ref >59)
GFR calc non Af Amer: 71 mL/min/{1.73_m2} (ref >59)
Glucose: 113 mg/dL — ABNORMAL HIGH (ref 65–99)
Potassium: 4.1 mmol/L (ref 3.5–5.2)
Sodium: 137 mmol/L (ref 134–144)

## 2016-07-29 LAB — CBC
HCT: 25.2 % — ABNORMAL LOW (ref 39.0–52.0)
HEMOGLOBIN: 8 g/dL — AB (ref 13.0–17.0)
Hematocrit: 21.7 % — ABNORMAL LOW (ref 37.5–51.0)
Hemoglobin: 7.1 g/dL — ABNORMAL LOW (ref 13.0–17.7)
MCH: 27.2 pg (ref 26.6–33.0)
MCH: 26.9 pg (ref 26.0–34.0)
MCHC: 32.7 g/dL (ref 31.5–35.7)
MCHC: 31.7 g/dL (ref 30.0–36.0)
MCV: 83 fL (ref 79–97)
MCV: 84.8 fL (ref 78.0–100.0)
PLATELETS: 94 10*3/uL — AB (ref 150–400)
Platelets: 125 10*3/uL — ABNORMAL LOW (ref 150–379)
RBC: 2.61 x10E6/uL — CL (ref 4.14–5.80)
RBC: 2.97 MIL/uL — ABNORMAL LOW (ref 4.22–5.81)
RDW: 17.4 % — ABNORMAL HIGH (ref 12.3–15.4)
RDW: 16.7 % — AB (ref 11.5–15.5)
WBC: 6.3 10*3/uL (ref 3.4–10.8)
WBC: 5.9 10*3/uL (ref 4.0–10.5)

## 2016-07-29 LAB — GLUCOSE, CAPILLARY
GLUCOSE-CAPILLARY: 99 mg/dL (ref 65–99)
GLUCOSE-CAPILLARY: 92 mg/dL (ref 65–99)
GLUCOSE-CAPILLARY: 120 mg/dL — AB (ref 65–99)

## 2016-07-29 LAB — PREPARE RBC (CROSSMATCH)

## 2016-07-29 LAB — I-STAT TROPONIN, ED: TROPONIN I, POC: 0.02 ng/mL (ref 0.00–0.08)

## 2016-07-29 LAB — PRO B NATRIURETIC PEPTIDE: NT-Pro BNP: 120 pg/mL (ref 0–376)

## 2016-07-29 LAB — MAGNESIUM: Magnesium: 2 mg/dL (ref 1.7–2.4)

## 2016-07-29 LAB — PHOSPHORUS: Phosphorus: 2.9 mg/dL (ref 2.5–4.6)

## 2016-07-29 LAB — RETICULOCYTES
RBC.: 2.34 MIL/uL — AB (ref 4.22–5.81)
RETIC COUNT ABSOLUTE: 100.6 10*3/uL (ref 19.0–186.0)
Retic Ct Pct: 4.3 % — ABNORMAL HIGH (ref 0.4–3.1)

## 2016-07-29 LAB — APTT: aPTT: 41 seconds — ABNORMAL HIGH (ref 24–36)

## 2016-07-29 LAB — PROTIME-INR
INR: 1.58
PROTHROMBIN TIME: 19.1 s — AB (ref 11.4–15.2)

## 2016-07-29 LAB — SAVE SMEAR

## 2016-07-29 MED ORDER — METOPROLOL TARTRATE 12.5 MG HALF TABLET
12.5000 mg | ORAL_TABLET | Freq: Two times a day (BID) | ORAL | Status: DC
  Administered 2016-07-30 – 2016-08-01 (×5): 12.5 mg via ORAL
  Filled 2016-07-29 (×5): qty 1

## 2016-07-29 MED ORDER — SODIUM CHLORIDE 0.9% FLUSH: 3.0000 mL | INTRAVENOUS | Status: DC | PRN

## 2016-07-29 MED ORDER — ACETAMINOPHEN 650 MG RE SUPP: 650.0000 mg | Freq: Four times a day (QID) | RECTAL | Status: DC | PRN

## 2016-07-29 MED ORDER — TORSEMIDE 20 MG PO TABS
40.0000 mg | ORAL_TABLET | Freq: Every day | ORAL | Status: DC
  Administered 2016-07-29 – 2016-08-01 (×4): 40 mg via ORAL
  Filled 2016-07-29 (×4): qty 2

## 2016-07-29 MED ORDER — SODIUM CHLORIDE 0.9% FLUSH
3.0000 mL | Freq: Two times a day (BID) | INTRAVENOUS | Status: DC
  Administered 2016-07-29 – 2016-08-01 (×5): 3 mL via INTRAVENOUS

## 2016-07-29 MED ORDER — FLUTICASONE PROPIONATE 50 MCG/ACT NA SUSP: 2.0000 | Freq: Every day | NASAL | Status: DC | PRN

## 2016-07-29 MED ORDER — POLYSACCHARIDE IRON COMPLEX 150 MG PO CAPS
150.0000 mg | ORAL_CAPSULE | Freq: Every day | ORAL | Status: DC
  Administered 2016-07-29 – 2016-08-01 (×4): 150 mg via ORAL
  Filled 2016-07-29 (×4): qty 1

## 2016-07-29 MED ORDER — ACETAMINOPHEN 325 MG PO TABS: 650.0000 mg | ORAL_TABLET | Freq: Four times a day (QID) | ORAL | Status: DC | PRN

## 2016-07-29 MED ORDER — SODIUM CHLORIDE 0.9 % IV SOLN: 10.0000 mL/h | Freq: Once | INTRAVENOUS | Status: DC

## 2016-07-29 MED ORDER — INSULIN ASPART 100 UNIT/ML ~~LOC~~ SOLN
0.0000 [IU] | Freq: Three times a day (TID) | SUBCUTANEOUS | Status: DC
  Administered 2016-07-30 – 2016-08-01 (×3): 3 [IU] via SUBCUTANEOUS

## 2016-07-29 MED ORDER — SODIUM CHLORIDE 0.9% FLUSH
3.0000 mL | Freq: Two times a day (BID) | INTRAVENOUS | Status: DC
  Administered 2016-07-29 – 2016-07-30 (×2): 3 mL via INTRAVENOUS

## 2016-07-29 MED ORDER — ONDANSETRON HCL 4 MG PO TABS: 4.0000 mg | ORAL_TABLET | Freq: Four times a day (QID) | ORAL | Status: DC | PRN

## 2016-07-29 MED ORDER — ONDANSETRON HCL 4 MG/2ML IJ SOLN: 4.0000 mg | Freq: Four times a day (QID) | INTRAMUSCULAR | Status: DC | PRN

## 2016-07-29 MED ORDER — SPIRONOLACTONE 25 MG PO TABS
25.0000 mg | ORAL_TABLET | Freq: Every day | ORAL | Status: DC
  Administered 2016-07-30 – 2016-08-01 (×3): 25 mg via ORAL
  Filled 2016-07-29 (×3): qty 1

## 2016-07-29 MED ORDER — SODIUM CHLORIDE 0.9 % IV SOLN
250.0000 mL | INTRAVENOUS | Status: DC | PRN
Start: 2016-07-29 — End: 2016-08-01

## 2016-07-29 NOTE — ED Provider Notes (Signed)
[facility] DEPT Provider Note   CSN: [id number] Arrival date & time: 07/29/16  4650     History   Chief Complaint Chief Complaint  Patient presents with  . Abnormal Lab  . Shortness of Breath    HPI William [name] is a 66 y.o. male.  HPI Patient presents for anemia. Has had weakness and shortness of breath was seen in cardiology yesterday. Blood work drawn and showed a hemoglobin of 7. Baseline hemoglobin closer to 11. Has had chronic GI bleeds without definite clear source. Has had EGD and colonoscopies. Had a capsule study also. Capsule study had some mid erosions. Has had to be transfused past. Patient has black stool but is on iron. States is not significantly different. No chest pain but has been more fatigued. Worse with lying down. States that night he sometimes have some tightness in his upper back with ambulation. States it is resolved by morning. No cough.  Past Medical History:  Diagnosis Date  . AAA (abdominal aortic aneurysm) ([place]) 03/2014   3.2 cm on MR abd.  F/u aortic u/s 06/2014 showed 3.0 x 3.1 cm AAA: recheck 2 yrs recommended (followed by cardiologist)  . Bilateral renal cysts    simple (03/2014 MRI)  . CAD (coronary artery disease)   . Chronic diastolic heart failure ([place])   . 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 Diabetes mellitus with complication ([place]) 35/4656   A1c 6.8%  . Hyperlipemia, mixed    elevated LFTs when on statins.    . Hypertension    Cr bump 04/01/16 so I changed benicar-hct to benicar plain and added amlodipine 5 mg.  . Iron deficiency anemia 05/2016   Acute blood loss anemia: hospitalized, required transfusion  . Microscopic hematuria    Eval unremarkable by Dr. Eulogio Ditch.  [name] NASH (nonalcoholic steatohepatitis)    Fatty liver on MR abd 03/2014, + hx of elevated transaminases and bili: followed by Dr. [name].  . Obesity     Patient Active Problem List   Diagnosis Date Noted  . Thrombocytopenia ([place]) 07/29/2016  .  Weakness 07/28/2016  . Aortic stenosis, mild 07/28/2016  . Anemia   . Chronic diastolic heart failure ([place])   . History of GI bleed 05/22/2016  . Pulmonary edema 05/22/2016  . Acute on chronic renal failure ([place]) 05/22/2016  . Non-insulin treated type 2 diabetes mellitus ([place]) 05/22/2016  . Elevated LFTs 01/17/2015  . Moderate obesity 11/15/2013  . Abdominal aortic aneurysm ([place]) 03/19/2013  . Hematuria 03/19/2013  . Morbid obesity ([place]) 08/24/2012  . 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 Metabolic syndrome 81/27/5170  . Essential hypertension 08/24/2012  . Mixed hyperlipidemia 08/24/2012    Past Surgical History:  Procedure Laterality Date  . CARDIAC CATHETERIZATION    . CARDIOVASCULAR STRESS TEST  01/17/2012; 05/04/16   Normal stress nuclear study x 2 (2018--Normal perfusion. LVEF 66% with normal wall motion. This is a low risk study).  . CERVICAL DISCECTOMY  1992  . COLONOSCOPY N/A 05/27/2016   No site/explanation for blood loss found.  Erythematous mucosa in cecum and ascending colon--Cecal bx: normal.  Procedure: COLONOSCOPY;  Surgeon: Carol Ada, MD;  Location[facility] ENDOSCOPY;  Service: Endoscopy;  Laterality: N/A;  . COLONOSCOPY W/ POLYPECTOMY  09/10/2014   Polypectomy x 2: recall 5 yrs (Dr. [name]).  . CORONARY ANGIOPLASTY    . CORONARY ARTERY BYPASS GRAFT  03/2006  . ESOPHAGOGASTRODUODENOSCOPY N/A 05/25/2016   No site/explanation for blood loss found.  Procedure: ESOPHAGOGASTRODUODENOSCOPY (EGD);  Surgeon: Juanita Craver, MD;  Location: [facility] ENDOSCOPY;  Service: Endoscopy;  Laterality: N/A;  . GIVENS CAPSULE STUDY N/A 05/27/2016   Procedure: GIVENS CAPSULE STUDY;  Surgeon: Carol Ada, MD;  Location: [facility];  Service: Endoscopy;  Laterality: N/A;  . TRANSTHORACIC ECHOCARDIOGRAM  05/25/2016   EF 60%, normal wall motion, grd II DD, mild aortic stenosis, dilated aortic root and ascending aorta.  [name] VASECTOMY  1977       Home Medications    Prior to Admission medications   Medication  Sig Start Date End Date Taking? Authorizing Provider  fluticasone (FLONASE) 50 MCG/ACT nasal spray Place 2 sprays into both nostrils daily. Patient taking differently: Place 2 sprays into both nostrils daily as needed for allergies.  01/01/16  Yes Withrow, Elyse Jarvis, FNP  Garlic 5638 MG CAPS Take 1 capsule by mouth 2 (two) times daily.   Yes [provider]  iron polysaccharides (NIFEREX) 150 MG capsule Take 1 capsule (150 mg total) by mouth daily. 06/22/16  Yes McGowen, Adrian Blackwater, MD  metFORMIN (GLUCOPHAGE) 500 MG tablet Take 1 tablet (500 mg total) by mouth 2 (two) times daily with a meal. Patient taking differently: Take 500 mg by mouth daily with breakfast.  04/27/16  Yes Troy Sine, MD  metoprolol tartrate (LOPRESSOR) 25 MG tablet Take 0.5 tablets (12.5 mg total) by mouth 2 (two) times daily. 06/22/16  Yes McGowen, Adrian Blackwater, MD  omega-3 acid ethyl esters (LOVAZA) 1 g capsule TAKE 2 CAPSULES BY MOUTH ONCE DAILY Patient taking differently: TAKE 1 CAPSULES BY MOUTH TWICE DAILY 03/24/16  Yes Troy Sine, MD  potassium chloride SA (K-DUR,KLOR-CON) 20 MEQ tablet Take 1 tablet (20 mEq total) by mouth 2 (two) times daily. 06/22/16  Yes McGowen, Adrian Blackwater, MD  spironolactone (ALDACTONE) 25 MG tablet Take 1 tablet (25 mg total) by mouth daily. 06/22/16  Yes McGowen, Adrian Blackwater, MD  torsemide (DEMADEX) 20 MG tablet Take 2 tablets (40 mg total) by mouth daily. 06/22/16  Yes McGowen, Adrian Blackwater, MD    Family History Family History  Problem Relation Age of Onset  . Hypertension Mother   . Cancer - Other Mother        liver cancer  . Heart disease Father   . Heart attack Father   . Cancer - Lung Father   . Diabetes Father   . Liver disease Sister   . Heart disease Brother        CABG 20 YEARS AGO  . Hypertension Brother   . Mesothelioma Brother        HALF-BROTHER  . Cancer - Lung Brother   . Cancer - Lung Sister        HALF-SISTER  . Cancer - Other Sister        KIDNEY  (HALF-SISTER)  .  Liver disease Brother   . Heart disease Brother        CABG-2012    Social History Social History  Substance Use Topics  . Smoking status: Former Smoker    Types: Cigarettes  . Smokeless tobacco: Never Used     Comment: QUIT I 1983  . Alcohol use No     Allergies   Statins   Review of Systems Review of Systems  Constitutional: Positive for fatigue. Negative for appetite change.  HENT: Negative for congestion.   Respiratory: Positive for shortness of breath. Negative for chest tightness.   Cardiovascular: Positive for leg swelling.  Gastrointestinal: Negative for abdominal pain.  Genitourinary: Negative  for flank pain.  Musculoskeletal: Negative for back pain.  Skin: Negative for pallor and rash.  Neurological: Negative for seizures.  Hematological: Negative for adenopathy.  Psychiatric/Behavioral: Negative for confusion.     Physical Exam Updated Vital Signs BP 113/60   Pulse 77   Temp 98.1 F (36.7 C) (Oral)   Resp 18   Ht 5' 8.5" (1.74 m)   Wt 112 kg (247 lb)   SpO2 96%   BMI 37.01 kg/m   Physical Exam  Constitutional: He appears well-developed.  HENT:  Head: Atraumatic.  Eyes: Pupils are equal, round, and reactive to light.  Neck: Neck supple.  Cardiovascular: Normal rate.   Pulmonary/Chest: Effort normal.  Abdominal: Soft. There is no tenderness.  Musculoskeletal: He exhibits edema.  Mild bilateral lower extremity pitting edema.  Skin: Capillary refill takes less than 2 seconds. There is pallor.  Psychiatric: He has a normal mood and affect.     ED Treatments / Results  Labs (all labs ordered are listed, but only abnormal results are displayed) Labs Reviewed  COMPREHENSIVE METABOLIC PANEL - Abnormal; Notable for the following:       Result Value   Sodium 134 (*)    Glucose, Bld 119 (*)    Calcium 8.6 (*)    Total Protein 6.3 (*)    Albumin 2.7 (*)    AST 44 (*)    Total Bilirubin 3.4 (*)    All other components within normal limits    CBC - Abnormal; Notable for the following:    RBC 2.38 (*)    Hemoglobin 6.3 (*)    HCT 20.0 (*)    RDW 17.6 (*)    Platelets 108 (*)    All other components within normal limits  APTT - Abnormal; Notable for the following:    aPTT 41 (*)    All other components within normal limits  PROTIME-INR - Abnormal; Notable for the following:    Prothrombin Time 19.1 (*)    All other components within normal limits  RETICULOCYTES - Abnormal; Notable for the following:    Retic Ct Pct 4.3 (*)    RBC. 2.34 (*)    All other components within normal limits  MAGNESIUM  PHOSPHORUS  SAVE SMEAR  GLUCOSE, CAPILLARY  CBC  I-STAT TROPOININ, ED  TYPE AND SCREEN  PREPARE RBC (CROSSMATCH)    EKG  EKG Interpretation  Date/Time:  Friday July 29 2016 10:12:54 EDT Ventricular Rate:  82 PR Interval:  214 QRS Duration: 114 QT Interval:  430 QTC Calculation: 502 R Axis:   9 Text Interpretation:  Sinus rhythm with 1st degree A-V block Incomplete right bundle branch block Possible Anterior infarct , age undetermined Abnormal ECG Confirmed by Alvino Chapel  MD, Caridad Silveira [phone]) on 07/29/2016 11:11:52 AM       Radiology Dg Chest 2 View  Result Date: 07/29/2016 CLINICAL DATA:  Shortness of breath, weakness EXAM: CHEST  2 VIEW COMPARISON:  05/23/2016 FINDINGS: Prior CABG. Heart is normal size. Mild interstitial prominence and areas of scarring in the lungs bilaterally. No acute airspace opacities or effusions. No acute bony abnormality. IMPRESSION: Interstitial prominence and areas of scarring.  No active disease. Electronically Signed   By: Rolm Baptise M.D.   On: 07/29/2016 11:16    Procedures Procedures (including critical care time)  Medications Ordered in ED Medications  0.9 %  sodium chloride infusion (10 mL/hr Intravenous Transfusing/Transfer 07/29/16 1220)  iron polysaccharides (NIFEREX) capsule 150 mg (not administered)  fluticasone (FLONASE) 50  MCG/ACT nasal spray 2 spray (not administered)   sodium chloride flush (NS) 0.9 % injection 3 mL (not administered)  sodium chloride flush (NS) 0.9 % injection 3 mL (not administered)  sodium chloride flush (NS) 0.9 % injection 3 mL (not administered)  0.9 %  sodium chloride infusion (not administered)  acetaminophen (TYLENOL) tablet 650 mg (not administered)    Or  acetaminophen (TYLENOL) suppository 650 mg (not administered)  ondansetron (ZOFRAN) tablet 4 mg (not administered)    Or  ondansetron (ZOFRAN) injection 4 mg (not administered)  insulin aspart (novoLOG) injection 0-9 Units (0 Units Subcutaneous Not Given 07/29/16 1200)  spironolactone (ALDACTONE) tablet 25 mg (not administered)  torsemide (DEMADEX) tablet 40 mg (not administered)  metoprolol tartrate (LOPRESSOR) tablet 12.5 mg (not administered)     Initial Impression / Assessment and Plan / ED Course  I have reviewed the triage vital signs and the nursing notes.  Pertinent labs & imaging results that were available during my care of the patient were reviewed by me and considered in my medical decision making (see chart for details).     Patient with anemia. History of same. Has been GI bleeds in the past. Symptomatic but good vital signs. Will admit to internal medicine.  Final Clinical Impressions(s) / ED Diagnoses   Final diagnoses:  Symptomatic anemia  Gastrointestinal hemorrhage, unspecified gastrointestinal hemorrhage type    New Prescriptions Current Discharge Medication List       Davonna Belling, MD 07/29/16 1549

## 2016-07-29 NOTE — Progress Notes (Signed)
Patient trasfered from ED to [phone] via wheelchair; alert and oriented x 4; no complaints of pain; IV in right hand running blood@160cc /hr. Orient patient to room and unit; gave patient care guide; instructed how to use the call bell and  fall risk precautions. Will continue to monitor the patient.

## 2016-07-29 NOTE — H&P (Signed)
History and Physical    [name]:[id number] DOB: 01/25/1951 DOA: 07/29/2016  PCP: Tammi Sou, MD Patient coming from: home  Chief Complaint: generalized weakness/sob  HPI: William [name] is a very pleasant 66 y.o. male with medical history significant for AAA, diabetes, high blood pressure, microscopic hematuria, Karlene Lineman cirrhosis, hyperlipidemia coronary artery disease status post CABG 2008, CHF persistent anemia presents to the emergency department as instructed by cardiology. Initial evaluation reveals worsening anemia with a hemoglobin of 6.3.  Information is obtained from the patient and his wife who is a Marine scientist and at the bedside. Patient was in the hospital 2 months ago for acute blood loss anemia presumably related to a GI bleed and acute pulmonary edema related to CHF in the setting of acute anemia. He was evaluated by GI who underwent capsule endoscopy conducted to follow-up with hematology.. Doing well until about a week ago he developed gradual worsening shortness of breath and generalized weakness. He reports that when he gets up in the morning he feels "good". By the middle of the afternoon he feels the fatigue coming on and is more short of breath with activity. Wife reports associated symptoms include anorexia and not sleeping well. Patient reports waking daily and denies any weight gain. He denies headache dizziness syncope or near-syncope. He denies abdominal pain nausea vomiting diarrhea constipation melena or bright red blood per rectum. He denies dysuria hematuria frequency or urgency. His chest pain palpitations lower extremity edema  He went to see his cardiologist yesterday and had blood work done. Hemoglobin noted to be 7 his baseline is closer to 11. Cardiology office called and instructed him to come to the emergency department.    ED Course: In the emergency department he is afebrile hemodynamically stable and not hypoxic.  Review of Systems: As per HPI  otherwise all other systems reviewed and are negative.   Ambulatory Status: Ambulate independently with a steady gait. Independent with ADLs  Past Medical History:  Diagnosis Date  . AAA (abdominal aortic aneurysm) ([place]) 03/2014   3.2 cm on MR abd.  F/u aortic u/s 06/2014 showed 3.0 x 3.1 cm AAA: recheck 2 yrs recommended (followed by cardiologist)  . Bilateral renal cysts    simple (03/2014 MRI)  . CAD (coronary artery disease)   . Chronic diastolic heart failure ([place])   . Cirrhosis ([place]) 05/2016   secondary to NASH  . Diabetes mellitus with complication ([place]) 27/0350   A1c 6.8%  . Hyperlipemia, mixed    elevated LFTs when on statins.    . Hypertension    Cr bump 04/01/16 so I changed benicar-hct to benicar plain and added amlodipine 5 mg.  . Iron deficiency anemia 05/2016   Acute blood loss anemia: hospitalized, required transfusion  . Microscopic hematuria    Eval unremarkable by Dr. Eulogio Ditch.  [name] NASH (nonalcoholic steatohepatitis)    Fatty liver on MR abd 03/2014, + hx of elevated transaminases and bili: followed by Dr. [name].  . Obesity     Past Surgical History:  Procedure Laterality Date  . CARDIAC CATHETERIZATION    . CARDIOVASCULAR STRESS TEST  01/17/2012; 05/04/16   Normal stress nuclear study x 2 (2018--Normal perfusion. LVEF 66% with normal wall motion. This is a low risk study).  . CERVICAL DISCECTOMY  1992  . COLONOSCOPY N/A 05/27/2016   No site/explanation for blood loss found.  Erythematous mucosa in cecum and ascending colon--Cecal bx: normal.  Procedure: COLONOSCOPY;  Surgeon: Carol Ada, MD;  Location:  [facility];  Service: Endoscopy;  Laterality: N/A;  . COLONOSCOPY W/ POLYPECTOMY  09/10/2014   Polypectomy x 2: recall 5 yrs (Dr. [name]).  . CORONARY ANGIOPLASTY    . CORONARY ARTERY BYPASS GRAFT  03/2006  . ESOPHAGOGASTRODUODENOSCOPY N/A 05/25/2016   No site/explanation for blood loss found.  Procedure: ESOPHAGOGASTRODUODENOSCOPY (EGD);  Surgeon: Juanita Craver,  MD;  Location: [facility] ENDOSCOPY;  Service: Endoscopy;  Laterality: N/A;  . GIVENS CAPSULE STUDY N/A 05/27/2016   Procedure: GIVENS CAPSULE STUDY;  Surgeon: Carol Ada, MD;  Location: [place];  Service: Endoscopy;  Laterality: N/A;  . TRANSTHORACIC ECHOCARDIOGRAM  05/25/2016   EF 60%, normal wall motion, grd II DD, mild aortic stenosis, dilated aortic root and ascending aorta.  [name] VASECTOMY  1977    Social History   Social History  . Marital status: Married    Spouse name: N/A  . Number of children: N/A  . Years of education: N/A   Occupational History  . Not on file.   Social History Main Topics  . Smoking status: Former Smoker    Types: Cigarettes  . Smokeless tobacco: Never Used     Comment: QUIT I 1983  . Alcohol use No  . Drug use: No  . Sexual activity: Not on file   Other Topics Concern  . Not on file   Social History Narrative   Married, 3 grown children, 3 GCs.   Educ: 10th grade.   Occupation: Retired Brewing technologist.   Tob: quit 1983, smoked about 20 pack-yr hx prior.   No alcohol.    Allergies  Allergen Reactions  . Statins Other (See Comments)    Liver enzymes go up whenever on them    Family History  Problem Relation Age of Onset  . Hypertension Mother   . Cancer - Other Mother        liver cancer  . Heart disease Father   . Heart attack Father   . Cancer - Lung Father   . Diabetes Father   . Liver disease Sister   . Heart disease Brother        CABG 20 YEARS AGO  . Hypertension Brother   . Mesothelioma Brother        HALF-BROTHER  . Cancer - Lung Brother   . Cancer - Lung Sister        HALF-SISTER  . Cancer - Other Sister        KIDNEY  (HALF-SISTER)  . Liver disease Brother   . Heart disease Brother        CABG-2012    Prior to Admission medications   Medication Sig Start Date End Date Taking? Authorizing Provider  fluticasone (FLONASE) 50 MCG/ACT nasal spray Place 2 sprays into both nostrils daily. Patient taking differently:  Place 2 sprays into both nostrils daily as needed for allergies.  01/01/16   Withrow, Elyse Jarvis, FNP  Garlic 7062 MG CAPS Take 1 capsule by mouth 2 (two) times daily.    [provider]  iron polysaccharides (NIFEREX) 150 MG capsule Take 1 capsule (150 mg total) by mouth daily. 06/22/16   McGowen, Adrian Blackwater, MD  metFORMIN (GLUCOPHAGE) 500 MG tablet Take 1 tablet (500 mg total) by mouth 2 (two) times daily with a meal. Patient taking differently: Take 500 mg by mouth daily with breakfast.  04/27/16   Troy Sine, MD  metoprolol tartrate (LOPRESSOR) 25 MG tablet Take 0.5 tablets (12.5 mg total) by mouth 2 (two) times daily. 06/22/16  McGowen, Adrian Blackwater, MD  omega-3 acid ethyl esters (LOVAZA) 1 g capsule TAKE 2 CAPSULES BY MOUTH ONCE DAILY Patient taking differently: TAKE 1 CAPSULES BY MOUTH TWICE DAILY 03/24/16   Troy Sine, MD  potassium chloride SA (K-DUR,KLOR-CON) 20 MEQ tablet Take 1 tablet (20 mEq total) by mouth 2 (two) times daily. 06/22/16   McGowen, Adrian Blackwater, MD  spironolactone (ALDACTONE) 25 MG tablet Take 1 tablet (25 mg total) by mouth daily. 06/22/16   McGowen, Adrian Blackwater, MD  torsemide (DEMADEX) 20 MG tablet Take 2 tablets (40 mg total) by mouth daily. 06/22/16   Tammi Sou, MD    Physical Exam: Vitals:   07/29/16 1010 07/29/16 1032 07/29/16 1045  BP: 131/62 (!) 125/58 135/66  Pulse: 83 87 85  Resp: 16 17 (!) 24  Temp: 97.9 F (36.6 C)    TempSrc: Oral    SpO2: 99% 95% 97%  Weight: 112 kg (247 lb)    Height: 5' 8.5" (1.74 m)       General:  Appears calm and comfortable, in no acute distress Eyes:  PERRL, EOMI, normal lids, iris ENT:  grossly normal hearing, lips & tongue, because membranes of his mouth are slightly pale but moist Neck:  no LAD, masses or thyromegaly Cardiovascular:  RRR, + murmur. No LE edema.  Respiratory:  CTA bilaterally, no w/r/r. Normal respiratory effort. Abdomen:  soft, ntnd, bruits positive bowel sounds throughout no guarding or  rebounding Skin:  no rash or induration seen on limited exam Musculoskeletal:  grossly normal tone BUE/BLE, good ROM, no bony abnormality Psychiatric:  grossly normal mood and affect, speech fluent and appropriate, AOx3 Neurologic:  CN 2-12 grossly intact, moves all extremities in coordinated fashion, sensation intact. Speech clear facial symmetry  Labs on Admission: I have personally reviewed following labs and imaging studies  CBC:  Recent Labs Lab 07/28/16 1416 07/29/16 1027  WBC 6.3 5.4  HGB 7.1* 6.3*  HCT 21.7* 20.0*  MCV 83 84.0  PLT 125* 270*   Basic Metabolic Panel:  Recent Labs Lab 07/28/16 1416 07/29/16 1027  NA 137 134*  K 4.1 3.9  CL 100 103  CO2 23 25  GLUCOSE 113* 119*  BUN 13 10  CREATININE 1.08 1.08  CALCIUM 9.0 8.6*   GFR: Estimated Creatinine Clearance: 82.4 mL/min (by C-G formula based on SCr of 1.08 mg/dL). Liver Function Tests:  Recent Labs Lab 07/29/16 1027  AST 44*  ALT 30  ALKPHOS 79  BILITOT 3.4*  PROT 6.3*  ALBUMIN 2.7*   No results for input(s): LIPASE, AMYLASE in the last 168 hours. No results for input(s): AMMONIA in the last 168 hours. Coagulation Profile: No results for input(s): INR, PROTIME in the last 168 hours. Cardiac Enzymes: No results for input(s): CKTOTAL, CKMB, CKMBINDEX, TROPONINI in the last 168 hours. BNP (last 3 results)  Recent Labs  07/28/16 1416  PROBNP 120   HbA1C: No results for input(s): HGBA1C in the last 72 hours. CBG: No results for input(s): GLUCAP in the last 168 hours. Lipid Profile: No results for input(s): CHOL, HDL, LDLCALC, TRIG, CHOLHDL, LDLDIRECT in the last 72 hours. Thyroid Function Tests: No results for input(s): TSH, T4TOTAL, FREET4, T3FREE, THYROIDAB in the last 72 hours. Anemia Panel: No results for input(s): VITAMINB12, FOLATE, FERRITIN, TIBC, IRON, RETICCTPCT in the last 72 hours. Urine analysis:    Component Value Date/Time   COLORURINE YELLOW 05/22/2016 [place] 05/22/2016 0918   LABSPEC 1.010 05/22/2016 7867  [place] 5.0 05/22/2016 0918   GLUCOSEU NEGATIVE 05/22/2016 0918   HGBUR NEGATIVE 05/22/2016 0918   BILIRUBINUR NEGATIVE 05/22/2016 [place] 05/22/2016 0918   PROTEINUR 100 (A) 05/22/2016 0918   NITRITE NEGATIVE 05/22/2016 0918   LEUKOCYTESUR NEGATIVE 05/22/2016 0918    Creatinine Clearance: Estimated Creatinine Clearance: 82.4 mL/min (by C-G formula based on SCr of 1.08 mg/dL).  Sepsis Labs: @LABRCNTIP (procalcitonin:4,lacticidven:4) )No results found for this or any previous visit (from the past 240 hour(s)).   Radiological Exams on Admission: Dg Chest 2 View  Result Date: 07/29/2016 CLINICAL DATA:  Shortness of breath, weakness EXAM: CHEST  2 VIEW COMPARISON:  05/23/2016 FINDINGS: Prior CABG. Heart is normal size. Mild interstitial prominence and areas of scarring in the lungs bilaterally. No acute airspace opacities or effusions. No acute bony abnormality. IMPRESSION: Interstitial prominence and areas of scarring.  No active disease. Electronically Signed   By: Rolm Baptise M.D.   On: 07/29/2016 11:16    EKG: Sinus rhythm with 1st degree A-V block Incomplete right bundle branch block   Assessment/Plan Principal Problem:   Anemia Active Problems:   Hx of CABG   Essential hypertension   Mixed hyperlipidemia   Moderate obesity   History of GI bleed   Non-insulin treated type 2 diabetes mellitus (HCC)   Chronic diastolic heart failure (HCC)   Weakness   Aortic stenosis, mild   #1. Anemia. Symptomatic. Patient with a history of iron deficiency anemia presumably related to chronic GI blood loss. Patient recently hospitalized and transfused. Recent EGD/colonoscopy without obvious results. He also had a capsule study revealing some erosions. Hemoglobin upon presentation 6.3. Down from 7.1 yesterday. Baseline appears to be 11. Note he was transfused 2 months ago for same. GI workup at that time  inconclusive. At that time LDH was minimally elevated-and Coombs test negative. Abdominal ultrasound indicated spleen mildly enlarged at that time. He was referred to hematology at discharge and that appointment was canceled. Wife does note that last hospitalization hemoglobin difficult to stablize -Admit to telemetry -Serial CBC -Abdominal ultrasound -Transfuse 2 units of packed RBC -hematology follow up OP. Appointment set for 6/26 with Dr Irene Limbo at 2:20.  #2. Chronic Diastolic heart failure. Appears compensated. EF recently 10-31% grade 2 diastolic dysfunction. BNP yesterday within the limits of normal. Chest x-ray without pulmonary edema. On admission blood pressure low end of normal. Home medications include Toprol, spironolactone, torsemide. Chart review indicates discharge weight 263 pounds -Daily weights -Monitor intake and output -Resume spironolactone and metoprolol tomorrow unless indicated earlier -Continue torsemide  #3. Diabetes type 2. Serum glucose 119 on admission. Home medications include oral agents. -Carb modified diet -hold oral agents for now as appetite unreliable -Sliding scale insulin for optimal control  #4. CAD status post coronary bypass graft 2008. No chest pain. Recent stress test low risk -Continue home meds  #5. Hypertension. Blood pressure on the low end of normal while in the emergency department. Home medications as noted above. -Hold metoprolol and spironolactone for now  #6.hyperbilirubinemia. Chronic.  Hx of liver cirrhosis.  Total bili 3.4 on admission which is trending down from 2 months ago.  #7. Thrombocytopenia. Likely related to above. Hx of same -monitor    DVT prophylaxis: scd  Code Status: full  Family Communication: wife at bedside  Disposition Plan: home  Consults called: none. Patient has appointment with Dr Irene Limbo hematology 08/02/16 at 2:20  Admission status: obs   Radene Gunning MD [facility]  If 7PM-7AM, please contact  night-coverage [email or web address] Password [facility]  07/29/2016, 11:56 AM

## 2016-07-29 NOTE — Telephone Encounter (Signed)
Called patient and communicated that he's been advised by APP to go to ED for transfusion d/t CBC results. Pt verbalized instruction, will go to [facility] ED this AM.  I've left Trish msg on phone to inform and call if anything needed.

## 2016-07-29 NOTE — ED Triage Notes (Signed)
Per wife, pt hx of diabetes, liver issues, CHF, pt has been c/o SOB at night, denies CP or SOB at this time, had lab work done at PCP three weeks ago hemoglobin was 11, yesterday it was 7. Pt hx of anemia, takes iron so unsure of any blood in stool, pt appears pale. Pt states hes had blood transfusions for anemia, pt has had several colonoscopy and endoscopy without known source of bleeing. Pt is AAOX4, in NAD.

## 2016-07-29 NOTE — Telephone Encounter (Signed)
Received call from Dyanne Carrel, NP at [facility] re appointment for hosp f/u. Per Santiago Glad patient was seen in April ED to Admission and again today. Case was discussed with GM back in April, however patient did not need to be seen at time.   Per Santiago Glad patient will be discharged tomorrow, after transfusion today and needs to be seen asap with any available physician. Scheduled patient for 6/26 @ 2:20 pm with GK to arrive at 1:50 pm. Per Santiago Glad she will discuss appointment with patient.

## 2016-07-29 NOTE — Telephone Encounter (Signed)
-----   Message from Rogelia Mire, NP sent at 07/29/2016  7:51 AM EDT ----- Orie Fisherman,  Lurena Joiner is off today and asked if I could f/u on this guys labs from yesterday.  It looks like a fax was sent to the office last night but nothing was in epic until this am.  Anyway, he's anemic again.  H/o gib.  Per Lurena Joiner, he's been feeling weak.  He will need to be transfused.  This is probably best managed through the ER unless there's a way to set him up for admission through the medicine team.  I'm going to copy Trish on this message in case she knows how to work that out.  Gerald Stabs

## 2016-07-29 NOTE — Telephone Encounter (Signed)
Per Santiago Glad patient being seen for symptomatic IDA.

## 2016-07-29 NOTE — ED Notes (Signed)
Patient taken to xray.

## 2016-07-30 DIAGNOSIS — Z6837 Body mass index (BMI) 37.0-37.9, adult: Secondary | ICD-10-CM | POA: Diagnosis not present

## 2016-07-30 DIAGNOSIS — Z8249 Family history of ischemic heart disease and other diseases of the circulatory system: Secondary | ICD-10-CM | POA: Diagnosis not present

## 2016-07-30 DIAGNOSIS — K7581 Nonalcoholic steatohepatitis (NASH): Secondary | ICD-10-CM | POA: Diagnosis present

## 2016-07-30 DIAGNOSIS — E669 Obesity, unspecified: Secondary | ICD-10-CM | POA: Diagnosis present

## 2016-07-30 DIAGNOSIS — K8020 Calculus of gallbladder without cholecystitis without obstruction: Secondary | ICD-10-CM | POA: Diagnosis present

## 2016-07-30 DIAGNOSIS — E119 Type 2 diabetes mellitus without complications: Secondary | ICD-10-CM | POA: Diagnosis present

## 2016-07-30 DIAGNOSIS — K3189 Other diseases of stomach and duodenum: Secondary | ICD-10-CM | POA: Diagnosis present

## 2016-07-30 DIAGNOSIS — Z79899 Other long term (current) drug therapy: Secondary | ICD-10-CM | POA: Diagnosis not present

## 2016-07-30 DIAGNOSIS — D509 Iron deficiency anemia, unspecified: Secondary | ICD-10-CM | POA: Diagnosis present

## 2016-07-30 DIAGNOSIS — Z8719 Personal history of other diseases of the digestive system: Secondary | ICD-10-CM

## 2016-07-30 DIAGNOSIS — I110 Hypertensive heart disease with heart failure: Secondary | ICD-10-CM | POA: Diagnosis present

## 2016-07-30 DIAGNOSIS — I350 Nonrheumatic aortic (valve) stenosis: Secondary | ICD-10-CM | POA: Diagnosis present

## 2016-07-30 DIAGNOSIS — N281 Cyst of kidney, acquired: Secondary | ICD-10-CM | POA: Diagnosis present

## 2016-07-30 DIAGNOSIS — I714 Abdominal aortic aneurysm, without rupture: Secondary | ICD-10-CM | POA: Diagnosis present

## 2016-07-30 DIAGNOSIS — K7460 Unspecified cirrhosis of liver: Secondary | ICD-10-CM | POA: Diagnosis present

## 2016-07-30 DIAGNOSIS — K766 Portal hypertension: Secondary | ICD-10-CM | POA: Diagnosis present

## 2016-07-30 DIAGNOSIS — I2510 Atherosclerotic heart disease of native coronary artery without angina pectoris: Secondary | ICD-10-CM | POA: Diagnosis present

## 2016-07-30 DIAGNOSIS — D500 Iron deficiency anemia secondary to blood loss (chronic): Secondary | ICD-10-CM | POA: Diagnosis not present

## 2016-07-30 DIAGNOSIS — K922 Gastrointestinal hemorrhage, unspecified: Secondary | ICD-10-CM | POA: Diagnosis present

## 2016-07-30 DIAGNOSIS — I10 Essential (primary) hypertension: Secondary | ICD-10-CM

## 2016-07-30 DIAGNOSIS — D696 Thrombocytopenia, unspecified: Secondary | ICD-10-CM | POA: Diagnosis not present

## 2016-07-30 DIAGNOSIS — R161 Splenomegaly, not elsewhere classified: Secondary | ICD-10-CM | POA: Diagnosis present

## 2016-07-30 DIAGNOSIS — E782 Mixed hyperlipidemia: Secondary | ICD-10-CM | POA: Diagnosis present

## 2016-07-30 DIAGNOSIS — I5032 Chronic diastolic (congestive) heart failure: Secondary | ICD-10-CM

## 2016-07-30 DIAGNOSIS — D649 Anemia, unspecified: Secondary | ICD-10-CM | POA: Diagnosis not present

## 2016-07-30 DIAGNOSIS — Z951 Presence of aortocoronary bypass graft: Secondary | ICD-10-CM | POA: Diagnosis not present

## 2016-07-30 DIAGNOSIS — Z7984 Long term (current) use of oral hypoglycemic drugs: Secondary | ICD-10-CM | POA: Diagnosis not present

## 2016-07-30 DIAGNOSIS — I8500 Esophageal varices without bleeding: Secondary | ICD-10-CM

## 2016-07-30 LAB — GLUCOSE, CAPILLARY
Glucose-Capillary: 108 mg/dL — ABNORMAL HIGH (ref 65–99)
Glucose-Capillary: 209 mg/dL — ABNORMAL HIGH (ref 65–99)
Glucose-Capillary: 77 mg/dL (ref 65–99)
Glucose-Capillary: 116 mg/dL — ABNORMAL HIGH (ref 65–99)

## 2016-07-30 LAB — CBC
HEMATOCRIT: 20 % — AB (ref 39.0–52.0)
HEMATOCRIT: 23.7 % — AB (ref 39.0–52.0)
Hemoglobin: 6.3 g/dL — CL (ref 13.0–17.0)
Hemoglobin: 7.5 g/dL — ABNORMAL LOW (ref 13.0–17.0)
MCH: 26.5 pg (ref 26.0–34.0)
MCH: 27.2 pg (ref 26.0–34.0)
MCHC: 31.5 g/dL (ref 30.0–36.0)
MCHC: 31.6 g/dL (ref 30.0–36.0)
MCV: 84 fL (ref 78.0–100.0)
MCV: 85.9 fL (ref 78.0–100.0)
Platelets: 108 10*3/uL — ABNORMAL LOW (ref 150–400)
Platelets: 93 10*3/uL — ABNORMAL LOW (ref 150–400)
RBC: 2.38 MIL/uL — ABNORMAL LOW (ref 4.22–5.81)
RBC: 2.76 MIL/uL — ABNORMAL LOW (ref 4.22–5.81)
RDW: 17.6 % — ABNORMAL HIGH (ref 11.5–15.5)
RDW: 17.1 % — AB (ref 11.5–15.5)
WBC: 5.4 10*3/uL (ref 4.0–10.5)
WBC: 5.3 10*3/uL (ref 4.0–10.5)

## 2016-07-30 LAB — COMPREHENSIVE METABOLIC PANEL
ALBUMIN: 2.6 g/dL — AB (ref 3.5–5.0)
ALK PHOS: 73 U/L (ref 38–126)
ALT: 27 U/L (ref 17–63)
AST: 41 U/L (ref 15–41)
Anion gap: 7 (ref 5–15)
BILIRUBIN TOTAL: 6.1 mg/dL — AB (ref 0.3–1.2)
BUN: 13 mg/dL (ref 6–20)
CO2: 26 mmol/L (ref 22–32)
Calcium: 8.4 mg/dL — ABNORMAL LOW (ref 8.9–10.3)
Chloride: 104 mmol/L (ref 101–111)
Creatinine, Ser: 1.22 mg/dL (ref 0.61–1.24)
GFR calc Af Amer: >60 mL/min (ref >60)
GFR calc non Af Amer: >60 mL/min (ref >60)
GLUCOSE: 114 mg/dL — AB (ref 65–99)
POTASSIUM: 3.6 mmol/L (ref 3.5–5.1)
Sodium: 137 mmol/L (ref 135–145)
TOTAL PROTEIN: 5.9 g/dL — AB (ref 6.5–8.1)

## 2016-07-30 LAB — PREPARE RBC (CROSSMATCH)

## 2016-07-30 LAB — HEMOGLOBIN AND HEMATOCRIT, BLOOD
HEMATOCRIT: 28.7 % — AB (ref 39.0–52.0)
HEMOGLOBIN: 9.3 g/dL — AB (ref 13.0–17.0)

## 2016-07-30 MED ORDER — FUROSEMIDE 10 MG/ML IJ SOLN
20.0000 mg | Freq: Once | INTRAMUSCULAR | Status: AC
  Administered 2016-07-30: 20 mg via INTRAVENOUS
  Filled 2016-07-30: qty 2

## 2016-07-30 MED ORDER — SODIUM CHLORIDE 0.9 % IV SOLN
Freq: Once | INTRAVENOUS | Status: AC
  Administered 2016-07-30: 12:00:00 via INTRAVENOUS

## 2016-07-30 MED ORDER — FUROSEMIDE 10 MG/ML IJ SOLN
20.0000 mg | Freq: Once | INTRAMUSCULAR | Status: AC
  Administered 2016-07-30: 20 mg via INTRAVENOUS

## 2016-07-30 NOTE — Consult Note (Signed)
GI consultation covering for Drs. Selinda Michaels   Referring Provider: [facility] Primary Care Physician:  Tammi Sou, MD Primary Gastroenterologist:  Dr. Verdia Kuba  Reason for Consultation:  anemia HPI: William [name] is a 66 y.o. male known to Dr. Meriel Pica, who was admitted last evening after labs checked at the cardiologist office showed a hemoglobin of 6.3. 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 Patient had a very recent GI evaluation when he was hospitalized in April 2018 for profound iron deficiency anemia. At that time he underwent upper endoscopy which was unremarkable. Patient then had colonoscopy which had some mild erythema at the cecum, and otherwise negative exam. Cecum was biopsied and these were unremarkable. He then had capsule endoscopy. Report is not available in Epic so we reviewed it in endo unit today. A few small SB erosions and a 27 minute transit time through SB. This did not definitely explain his anemia or heme positive stool. Patient did require iron infusions during that admission and transfusions. He has been on Niferex at home. He had been doing well until about a week and a half ago when he began noticing recurrent fatigue. His wife says he has gradually gotten more fatigued since then and began complaining of some shortness of breath with exertion. He also had some burning pain across the back of his shoulder blades which she says was very similar to how he felt last admission. He has no GI symptoms, specifically no heartburn indigestion or nausea. He had noted some burning in his upper abdomen last week as well. His stools have been dark on iron but no melena or hematochezia. Baseline hemoglobin had been about 14. Iron studies done 06/29/2016 showed a ferritin of 33  serum iron of 83 and TIBC of 429. Again this was after iron infusions. Patient has received 2 units of packed RBCs since admission, and last hemoglobin was 7.5 hematocrit of 23.7, platelets of 93. Stool for occult blood is pending.   Past Medical History:  Diagnosis Date  . AAA (abdominal aortic aneurysm) ([place]) 03/2014   3.2 cm on MR abd.  F/u aortic u/s 06/2014 showed 3.0 x 3.1 cm AAA: recheck 2 yrs recommended (followed by cardiologist)  . Bilateral renal cysts    simple (03/2014 MRI)  . CAD (coronary artery disease)   . Chronic diastolic heart failure ([place])   . Cirrhosis ([place]) 05/2016   secondary to NASH  . Diabetes mellitus with complication ([place]) 50/9326   A1c 6.8%  . Hyperlipemia, mixed    elevated LFTs when on statins.    . Hypertension    Cr bump 04/01/16 so I changed benicar-hct to benicar plain and added amlodipine 5 mg.  . Iron deficiency anemia 05/2016   Acute blood loss anemia: hospitalized, required transfusion  . Microscopic hematuria    Eval unremarkable by Dr. Eulogio Ditch.  [name] NASH (nonalcoholic steatohepatitis)    Fatty liver on MR abd 03/2014, + hx of elevated transaminases and bili: followed by Dr. [name].  . Obesity     Past Surgical History:  Procedure Laterality Date  . CARDIAC CATHETERIZATION    . CARDIOVASCULAR STRESS TEST  01/17/2012; 05/04/16   Normal stress nuclear study x 2 (2018--Normal perfusion. LVEF 66% with normal wall motion. This is a low risk study).  . CERVICAL DISCECTOMY  1992  . COLONOSCOPY  N/A 05/27/2016   No site/explanation for blood loss found.  Erythematous mucosa in cecum and ascending colon--Cecal bx: normal.  Procedure: COLONOSCOPY;  Surgeon: Carol Ada, MD;  Location: [facility] ENDOSCOPY;  Service: Endoscopy;  Laterality: N/A;  . COLONOSCOPY W/ POLYPECTOMY  09/10/2014   Polypectomy x 2: recall 5 yrs (Dr. [name]).  . CORONARY ANGIOPLASTY    . CORONARY ARTERY BYPASS GRAFT  03/2006  . ESOPHAGOGASTRODUODENOSCOPY N/A 05/25/2016   No site/explanation  for blood loss found.  Procedure: ESOPHAGOGASTRODUODENOSCOPY (EGD);  Surgeon: Juanita Craver, MD;  Location: [facility] ENDOSCOPY;  Service: Endoscopy;  Laterality: N/A;  . GIVENS CAPSULE STUDY N/A 05/27/2016   Procedure: GIVENS CAPSULE STUDY;  Surgeon: Carol Ada, MD;  Location: [place];  Service: Endoscopy;  Laterality: N/A;  . TRANSTHORACIC ECHOCARDIOGRAM  05/25/2016   EF 60%, normal wall motion, grd II DD, mild aortic stenosis, dilated aortic root and ascending aorta.  [name] VASECTOMY  1977    Prior to Admission medications   Medication Sig Start Date End Date Taking? Authorizing Provider  fluticasone (FLONASE) 50 MCG/ACT nasal spray Place 2 sprays into both nostrils daily. Patient taking differently: Place 2 sprays into both nostrils daily as needed for allergies.  01/01/16  Yes Withrow, Elyse Jarvis, FNP  Garlic 5035 MG CAPS Take 1 capsule by mouth 2 (two) times daily.   Yes [provider]  iron polysaccharides (NIFEREX) 150 MG capsule Take 1 capsule (150 mg total) by mouth daily. 06/22/16  Yes McGowen, Adrian Blackwater, MD  metFORMIN (GLUCOPHAGE) 500 MG tablet Take 1 tablet (500 mg total) by mouth 2 (two) times daily with a meal. Patient taking differently: Take 500 mg by mouth daily with breakfast.  04/27/16  Yes Troy Sine, MD  metoprolol tartrate (LOPRESSOR) 25 MG tablet Take 0.5 tablets (12.5 mg total) by mouth 2 (two) times daily. 06/22/16  Yes McGowen, Adrian Blackwater, MD  omega-3 acid ethyl esters (LOVAZA) 1 g capsule TAKE 2 CAPSULES BY MOUTH ONCE DAILY Patient taking differently: TAKE 1 CAPSULES BY MOUTH TWICE DAILY 03/24/16  Yes Troy Sine, MD  potassium chloride SA (K-DUR,KLOR-CON) 20 MEQ tablet Take 1 tablet (20 mEq total) by mouth 2 (two) times daily. 06/22/16  Yes McGowen, Adrian Blackwater, MD  spironolactone (ALDACTONE) 25 MG tablet Take 1 tablet (25 mg total) by mouth daily. 06/22/16  Yes McGowen, Adrian Blackwater, MD  torsemide (DEMADEX) 20 MG tablet Take 2 tablets (40 mg total) by mouth daily. 06/22/16   Yes McGowen, Adrian Blackwater, MD    Current Facility-Administered Medications  Medication Dose Route Frequency Provider Last Rate Last Dose  . 0.9 %  sodium chloride infusion  10 mL/hr Intravenous Once Davonna Belling, MD      . 0.9 %  sodium chloride infusion  250 mL Intravenous PRN Black, Lezlie Octave, NP      . 0.9 %  sodium chloride infusion   Intravenous Once [place], Silver Huguenin, MD      . acetaminophen (TYLENOL) tablet 650 mg  650 mg Oral Q6H PRN Radene Gunning, NP       Or  . acetaminophen (TYLENOL) suppository 650 mg  650 mg Rectal Q6H PRN Black, Karen M, NP      . fluticasone (FLONASE) 50 MCG/ACT nasal spray 2 spray  2 spray Each Nare Daily PRN Radene Gunning, NP      . furosemide (LASIX) injection 20 mg  20 mg Intravenous Once Elgergawy, Silver Huguenin, MD      . furosemide (LASIX) injection  20 mg  20 mg Intravenous Once Elger[place]y, Silver Huguenin, MD      . insulin aspart (novoLOG) injection 0-9 Units  0-9 Units Subcutaneous TID WC Black, Lezlie Octave, NP      . iron polysaccharides (NIFEREX) capsule 150 mg  150 mg Oral Daily Radene Gunning, NP   150 mg at 07/30/16 2841  . metoprolol tartrate (LOPRESSOR) tablet 12.5 mg  12.5 mg Oral BID Dyanne Carrel M, NP   12.5 mg at 07/30/16 3244  . ondansetron (ZOFRAN) tablet 4 mg  4 mg Oral Q6H PRN Radene Gunning, NP       Or  . ondansetron [facility]) injection 4 mg  4 mg Intravenous Q6H PRN Radene Gunning, NP      . sodium chloride flush (NS) 0.9 % injection 3 mL  3 mL Intravenous Q12H Radene Gunning, NP   3 mL at 07/29/16 2206  . sodium chloride flush (NS) 0.9 % injection 3 mL  3 mL Intravenous Q12H Radene Gunning, NP   3 mL at 07/29/16 2205  . sodium chloride flush (NS) 0.9 % injection 3 mL  3 mL Intravenous PRN Black, Karen M, NP      . spironolactone (ALDACTONE) tablet 25 mg  25 mg Oral Daily Radene Gunning, NP   25 mg at 07/30/16 0102  . torsemide (DEMADEX) tablet 40 mg  40 mg Oral Daily Radene Gunning, NP   40 mg at 07/30/16 7253    Allergies as of 07/29/2016 -  Review Complete 07/29/2016  Allergen Reaction Noted  . Statins Other (See Comments) 05/22/2016    Family History  Problem Relation Age of Onset  . Hypertension Mother   . Cancer - Other Mother        liver cancer  . Heart disease Father   . Heart attack Father   . Cancer - Lung Father   . Diabetes Father   . Liver disease Sister   . Heart disease Brother        CABG 20 YEARS AGO  . Hypertension Brother   . Mesothelioma Brother        HALF-BROTHER  . Cancer - Lung Brother   . Cancer - Lung Sister        HALF-SISTER  . Cancer - Other Sister        KIDNEY  (HALF-SISTER)  . Liver disease Brother   . Heart disease Brother        CABG-2012    Social History   Social History  . Marital status: Married    Spouse name: N/A  . Number of children: N/A  . Years of education: N/A   Occupational History  . Not on file.   Social History Main Topics  . Smoking status: Former Smoker    Types: Cigarettes  . Smokeless tobacco: Never Used     Comment: QUIT I 1983  . Alcohol use No  . Drug use: No  . Sexual activity: Not on file   Other Topics Concern  . Not on file   Social History Narrative   Married, 3 grown children, 3 GCs.   Educ: 10th grade.   Occupation: Retired Brewing technologist.   Tob: quit 1983, smoked about 20 pack-yr hx prior.   No alcohol.    Review of Systems: Pertinent positive and negative review of systems were noted in the above HPI section.  All other review of systems was otherwise negative. Physical Exam: Vital signs in last  24 hours: Temp:  [97.7 F (36.5 C)-98.6 F (37 C)] 98.2 F (36.8 C) (06/23 0446) Pulse Rate:  [75-95] 95 (06/23 0446) Resp:  [16-24] 18 (06/23 0446) BP: (106-135)/(51-81) 111/61 (06/23 0446) SpO2:  [95 %-100 %] 97 % (06/23 0446) Weight:  [244 lb 8 oz (110.9 kg)-247 lb (112 kg)] 244 lb 8 oz (110.9 kg) (06/23 0446) Last BM Date: 07/29/16 General:   Alert,  Well-developed, well-nourished, pleasant and cooperative in NAD,Sallow  complexion Head:  Normocephalic and atraumatic. Eyes:  Sclera clear, no icterus.   Conjunctiva pale Ears:  Normal auditory acuity. Nose:  No deformity, discharge,  or lesions. Mouth:  No deformity or lesions.   Neck:  Supple; no masses or thyromegaly. Lungs:  Clear throughout to auscultation.   No wheezes, crackles, or rhonchi.  Heart:  Regular rate and rhythm; no murmurs, clicks, rubs,  or gallops. Sternal incisional scar Abdomen:  Soft,nontender, BS active,nonpalp mass or hsm.   Rectal:  Deferred  Msk:  Symmetrical without gross deformities. . Pulses:  Normal pulses noted. Extremities:  Without clubbing or edema. Neurologic:  Alert and  oriented x4;  grossly normal neurologically. Skin:  Intact without significant lesions or rashes.. Psych:  Alert and cooperative. Normal mood and affect.  Intake/Output from previous day: 06/22 0701 - 06/23 0700 In: 678 [P.O.:60; I.V.:6; Blood:612] Out: -  Intake/Output this shift: No intake/output data recorded.  Lab Results:  Recent Labs  07/29/16 1027 07/29/16 2133 07/30/16 0337  WBC 5.4 5.9 5.3  HGB 6.3* 8.0* 7.5*  HCT 20.0* 25.2* 23.7*  PLT 108* 94* 93*   BMET  Recent Labs  07/28/16 1416 07/29/16 1027 07/30/16 0337  NA 137 134* 137  K 4.1 3.9 3.6  CL 100 103 104  CO2 23 25 26   GLUCOSE 113* 119* 114*  BUN 13 10 13   CREATININE 1.08 1.08 1.22  CALCIUM 9.0 8.6* 8.4*   LFT  Recent Labs  07/30/16 0337  PROT 5.9*  ALBUMIN 2.6*  AST 41  ALT 27  ALKPHOS 73  BILITOT 6.1*   PT/INR  Recent Labs  07/29/16 1200  LABPROT 19.1*  INR 1.58    IMPRESSION:  #106  66 year old white male with recurrent profound anemia, symptomatic with fatigue shortness of breath and burning pain across his shoulders. No overt GI bleeding. Patient has undergone recent GI evaluation for similar presentation with severe iron deficiency anemia in April 2018. Evaluation with EGD, colonoscopy and capsule endoscopy with no definite etiology found.  He did have some erosions noted in the small bowel on capsule endoscopy and the SB transit time was very rapid.  #2 history of iron deficiency-most recent iron studies are normal post iron infusions #3 coronary artery disease status post CABG 2008 #4 congestive heart failure #5 NASH cirrhosis-no varices #6 thrombocytopenia chronic #7 adult-onset diabetes mellitus #8 abdominal aortic aneurysm 3.2 cm #9 splenomegaly #10 multiple gallstones  PLAN: #1 Full liquid diet today #2 2 more units of packed RBCs today, serial hemoglobins, follow-up on stool for occult blood. #3 Small bowel enteroscopy tomorrow.   Amy Esterwood  07/30/2016, 9:46 AM     Attending physician's note   I have taken a history, examined the patient and reviewed the chart. I agree with the Advanced Practitioner's note, impression and recommendations.   Recurrent anemia, suspicious for chronic, occult GI blood loss. Awaiting stool hemoccult. Iron deficiency has been corrected. Anemia being corrected with transfusions to keep Hb > 8.   Push enteroscopy tomorrow. If no source  noted on enteroscopy consider repeat CE study given the very rapid transit time on prior CE. Defer further plans to Drs. [name] and [name] who will resume care on Monday.   Lucio Edward, MD [facility] [phone] Mon-Fri 8a-5p [phone] after 5p, weekends, holidays

## 2016-07-30 NOTE — Progress Notes (Signed)
PROGRESS NOTE                                                                                                                                                                                                             Patient Demographics:    William [name], is a 66 y.o. male, DOB - 09-14-1950, ZOX:[id number]  Admit date - 07/29/2016   Admitting Physician Elwin Mocha, MD  Outpatient Primary MD for the patient is McGowen, Adrian Blackwater, MD  LOS - 0  Outpatient Specialists: GI Dr. [name]  Chief Complaint  Patient presents with  . Abnormal Lab  . Shortness of Breath       Brief Narrative   66 y.o. male with medical history significant for AAA, diabetes, high blood pressure, microscopic hematuria, Karlene Lineman cirrhosis, hyperlipidemia coronary artery disease status post CABG 2008, CHF persistent anemia presents to the emergency department as instructed by cardiology. Initial evaluation reveals worsening anemia with a hemoglobin of 6.3.   Subjective:    William [name] today has, No headache, No chest pain, No abdominal pain , Reports generalized weakness, lightheadedness, he reports  dark color stool since he started taking oral iron, with no recent change in color, no nausea or vomiting .   Assessment  & Plan :    Principal Problem:   Anemia Active Problems:   Hx of CABG   Essential hypertension   Mixed hyperlipidemia   Moderate obesity   History of GI bleed   Non-insulin treated type 2 diabetes mellitus (HCC)   Chronic diastolic heart failure (H[facility])   Weakness   Aortic stenosis, mild   Thrombocytopenia ([facility])   Anemia. Symptomatic.  - Agent with recurrent profound anemia, workup significant for iron deficiency anemia in the past, she had extensive workup including recent colonoscopy, endoscopy, capsule endoscopy by Dr. [name] during recent admission, significant only for lesion/possible AVM and small bowel capsule endoscopy . - Presents with a hemoglobin  of 6.3, transfused 2 units PRBC, hemoglobin only went up to 7.5, will transfuse another 2 units PRBC, monitor H&H closed at transfuse as needed . - GI input greatly appreciated , continue full liquid diet today, possible EGD and small bowel endoscopy as next step  - Hemoglobin is 7.5 this a.m., will receive another 2 units PRBC. -hematology follow up OP. Appointment set for 6/26 with Dr  Kale at 2:20.  Chronic Diastolic heart failure. - Appears to be euvolemic currently, Appears compensated. EF recently 20-35% grade 2 diastolic dysfunction. BNPwithin the limits of normal. Chest x-ray without pulmonary edema. - Continue Home medications include Toprol, spironolactone, torsemide. Chart review indicates discharge weight 263 pounds - Daily weights, Monitor intake and output  Diabetes type 2.  - Continue to hold oral hypoglycemic agents, continue with insulin sliding scale    CAD status post coronary bypass graft 2008. - No chest pain. Recent stress test low risk - Continue home meds  Hypertension.  - Blood pressure acceptable, continue with metoprolol    Hyperbilirubinemia. - Chronic.  Hx of liver cirrhosis.  Total bili 3.4 on admission which is trending down from 2 months ago.  Thrombocytopenia.  - Likely related to above. Hx of same -monitor   Code Status : Full  Family Communication  : Wife at bedside  Disposition Plan  : Home when stable  Consults  :  GI  Procedures  : None  DVT Prophylaxis  : SCDs   Lab Results  Component Value Date   PLT 93 (L) 07/30/2016    Antibiotics  :    Anti-infectives    None        Objective:   Vitals:   07/29/16 2312 07/30/16 0446 07/30/16 1120 07/30/16 1146  BP: (!) 106/51 111/61 115/60 119/62  Pulse: 89 95 76 75  Resp:  18 18 18   Temp:  98.2 F (36.8 C) 98.3 F (36.8 C) 98.2 F (36.8 C)  TempSrc:  Oral Oral Oral  SpO2: 98% 97% 97% 96%  Weight:  110.9 kg (244 lb 8 oz)    Height:        Wt Readings from Last 3  Encounters:  07/30/16 110.9 kg (244 lb 8 oz)  07/28/16 112.4 kg (247 lb 12.8 oz)  06/29/16 113.9 kg (251 lb)     Intake/Output Summary (Last 24 hours) at 07/30/16 1339 Last data filed at 07/30/16 1200  Gross per 24 hour  Intake              718 ml  Output             2500 ml  Net            -1782 ml     Physical Exam  Awake Alert, Oriented X 3,  Symmetrical Chest wall movement, Good air movement bilaterally, CTAB RRR,No Gallops,Rubs or new Murmurs, No Parasternal Heave +ve B.Sounds, Abd Soft, No tenderness, , No rebound - guarding or rigidity. No Cyanosis, Clubbing or edema, No new Rash or bruise      Data Review:    CBC  Recent [name] Lab 07/28/16 1416 07/29/16 1027 07/29/16 2133 07/30/16 0337  WBC 6.3 5.4 5.9 5.3  HGB 7.1* 6.3* 8.0* 7.5*  HCT 21.7* 20.0* 25.2* 23.7*  PLT 125* 108* 94* 93*  MCV 83 84.0 84.8 85.9  MCH 27.2 26.5 26.9 27.2  MCHC 32.7 31.5 31.7 31.6  RDW 17.4* 17.6* 16.7* 17.1*    Chemistries   Recent [name] Lab 07/28/16 1416 07/29/16 1027 07/29/16 1200 07/30/16 0337  NA 137 134*  --  137  K 4.1 3.9  --  3.6  CL 100 103  --  104  CO2 23 25  --  26  GLUCOSE 113* 119*  --  114*  BUN 13 10  --  13  CREATININE 1.08 1.08  --  1.22  CALCIUM 9.0 8.6*  --  8.4*  MG  --   --  2.0  --   AST  --  44*  --  41  ALT  --  30  --  27  ALKPHOS  --  79  --  73  BILITOT  --  3.4*  --  6.1*   ------------------------------------------------------------------------------------------------------------------ No results for input(s): CHOL, HDL, LDLCALC, TRIG, CHOLHDL, LDLDIRECT in the last 72 hours.  Lab Results  Component Value Date   HGBA1C 5.4 06/29/2016   ------------------------------------------------------------------------------------------------------------------ No results for input(s): TSH, T4TOTAL, T3FREE, THYROIDAB in the last 72 hours.  Invalid input(s):  FREET3 ------------------------------------------------------------------------------------------------------------------  Recent [name]  07/29/16 1200  RETICCTPCT 4.3*    Coagulation profile  Recent [name] Lab 07/29/16 1200  INR 1.58    No results for input(s): DDIMER in the last 72 hours.  Cardiac Enzymes No results for input(s): CKMB, TROPONINI, MYOGLOBIN in the last 168 hours.  Invalid input(s): CK ------------------------------------------------------------------------------------------------------------------    Component Value Date/Time   BNP 343.4 (H) 05/26/2016 0307   BNP 574.6 (H) 05/04/2016 1012    Inpatient Medications  Scheduled Meds: . furosemide  20 mg Intravenous Once  . furosemide  20 mg Intravenous Once  . insulin aspart  0-9 Units Subcutaneous TID WC  . iron polysaccharides  150 mg Oral Daily  . metoprolol tartrate  12.5 mg Oral BID  . sodium chloride flush  3 mL Intravenous Q12H  . sodium chloride flush  3 mL Intravenous Q12H  . spironolactone  25 mg Oral Daily  . torsemide  40 mg Oral Daily   Continuous Infusions: . sodium chloride    . sodium chloride     PRN Meds:.sodium chloride, acetaminophen **OR** acetaminophen, fluticasone, ondansetron **OR** ondansetron (ZOFRAN) IV, sodium chloride flush  Micro Results No results found for this or any previous visit (from the past 240 hour(s)).  Radiology Reports Dg Chest 2 View  Result Date: 07/29/2016 CLINICAL DATA:  Shortness of breath, weakness EXAM: CHEST  2 VIEW COMPARISON:  05/23/2016 FINDINGS: Prior CABG. Heart is normal size. Mild interstitial prominence and areas of scarring in the lungs bilaterally. No acute airspace opacities or effusions. No acute bony abnormality. IMPRESSION: Interstitial prominence and areas of scarring.  No active disease. Electronically Signed   By: Rolm Baptise M.D.   On: 07/29/2016 11:16   US Abdomen Complete  Result Date: 07/29/2016 CLINICAL DATA:  Anemia for 1  day.  History of thrombocytopenia. EXAM: ABDOMEN ULTRASOUND COMPLETE COMPARISON:  Ultrasound, 05/24/2016 FINDINGS: Gallbladder: Multiple gallstones, mobile and dependent. Largest measures 1 cm. No wall thickening or pericholecystic fluid. Common bile duct: Diameter: 4.3 mm Liver: Coarsened echogenicity with no discrete mass or focal lesion. Nodular contour. Hepatopetal flow documented in the portal vein. IVC: No abnormality visualized. Pancreas: Visualized portion unremarkable. Spleen: Enlarged measuring 12.6 x 15.4 x 8.9 cm, volume calculated at 902 mL. Spleen measures larger than on the prior study where the volume was calculated at 494 mL. No discrete splenic mass or focal lesion. Right Kidney: Length: 12.3 cm. Normal parenchymal echogenicity. Lateral midpole cyst measuring 3.8 x 3.1 x 3.0 cm. This is stable. No other right renal masses, no stones and no hydronephrosis. Left Kidney: Length: 12.1 cm. Normal parenchymal echogenicity. 2.8 cm midpole cyst. 1.4 cm lower pole cyst. No other masses, no stones and no hydronephrosis. Abdominal aorta: Dilated to 3.0 x 3.1 cm. Atherosclerotic calcifications noted along the wall. This aneurysm was not evident on the prior ultrasound, presumably due to bowel gas obscuring the lower abdominal  aorta. Other findings: None. IMPRESSION: 1. No acute findings. 2. Splenomegaly. The spleen has increased in size the prior study increasing from 494 mL to 902 mL. 3. Liver appearance suggests cirrhosis similar to the prior exam. No liver mass or focal lesion. Hepatopetal flow in the portal vein. 4. Renal cysts. 5. 3.1 cm abdominal aortic aneurysm. Recommend followup by ultrasound in 3 years. This recommendation follows ACR consensus guidelines: White Paper of the ACR Incidental Findings Committee II on Vascular Findings. Natasha Mead Coll Radiol 2013; 10:[id number] Electronically Signed   By: Lajean Manes M.D.   On: 07/29/2016 20:35      William [name], William [name] M.D on 07/30/2016 at 1:39 PM  Between  7am to 7pm - Pager - [phone]  After 7pm go to [email or web address] - password [facility]  [facility] -  Office  [phone]

## 2016-07-31 ENCOUNTER — Encounter (HOSPITAL_COMMUNITY): Payer: Self-pay

## 2016-07-31 ENCOUNTER — Encounter (HOSPITAL_COMMUNITY)
Admission: EM | Disposition: A | Discharge status: Home / Self Care | Payer: Self-pay | Source: Home / Self Care | Attending: Internal Medicine

## 2016-07-31 DIAGNOSIS — D696 Thrombocytopenia, unspecified: Secondary | ICD-10-CM

## 2016-07-31 HISTORY — PX: ENTEROSCOPY: SHX5533

## 2016-07-31 LAB — TYPE AND SCREEN
ABO/RH(D): O POS
Antibody Screen: NEGATIVE
UNIT DIVISION: 0
Unit division: 0
Unit division: 0
Unit division: 0

## 2016-07-31 LAB — HEPATIC FUNCTION PANEL
ALBUMIN: 2.8 g/dL — AB (ref 3.5–5.0)
ALT: 29 U/L (ref 17–63)
AST: 44 U/L — ABNORMAL HIGH (ref 15–41)
Alkaline Phosphatase: 78 U/L (ref 38–126)
BILIRUBIN DIRECT: 0.8 mg/dL — AB (ref 0.1–0.5)
BILIRUBIN TOTAL: 6.7 mg/dL — AB (ref 0.3–1.2)
Indirect Bilirubin: 5.9 mg/dL — ABNORMAL HIGH (ref 0.3–0.9)
Total Protein: 6.3 g/dL — ABNORMAL LOW (ref 6.5–8.1)

## 2016-07-31 LAB — BPAM RBC
BLOOD PRODUCT EXPIRATION DATE: 201807132359
BLOOD PRODUCT EXPIRATION DATE: 201807132359
BLOOD PRODUCT EXPIRATION DATE: 201807152359
Blood Product Expiration Date: 201807132359
ISSUE DATE / TIME: 201806221208
ISSUE DATE / TIME: 201806221458
ISSUE DATE / TIME: 201806231121
ISSUE DATE / TIME: 201806231455
UNIT TYPE AND RH: 5100
UNIT TYPE AND RH: 5100
UNIT TYPE AND RH: 5100
Unit Type and Rh: 5100

## 2016-07-31 LAB — GLUCOSE, CAPILLARY
GLUCOSE-CAPILLARY: 212 mg/dL — AB (ref 65–99)
GLUCOSE-CAPILLARY: 122 mg/dL — AB (ref 65–99)
GLUCOSE-CAPILLARY: 65 mg/dL (ref 65–99)
GLUCOSE-CAPILLARY: 118 mg/dL — AB (ref 65–99)
Glucose-Capillary: 108 mg/dL — ABNORMAL HIGH (ref 65–99)

## 2016-07-31 LAB — CBC
HCT: 29.9 % — ABNORMAL LOW (ref 39.0–52.0)
HEMATOCRIT: 29.2 % — AB (ref 39.0–52.0)
HEMOGLOBIN: 9.5 g/dL — AB (ref 13.0–17.0)
Hemoglobin: 9.6 g/dL — ABNORMAL LOW (ref 13.0–17.0)
MCH: 27.5 pg (ref 26.0–34.0)
MCH: 27.4 pg (ref 26.0–34.0)
MCHC: 32.5 g/dL (ref 30.0–36.0)
MCHC: 32.1 g/dL (ref 30.0–36.0)
MCV: 84.6 fL (ref 78.0–100.0)
MCV: 85.4 fL (ref 78.0–100.0)
PLATELETS: 97 10*3/uL — AB (ref 150–400)
Platelets: 96 10*3/uL — ABNORMAL LOW (ref 150–400)
RBC: 3.45 MIL/uL — AB (ref 4.22–5.81)
RBC: 3.5 MIL/uL — AB (ref 4.22–5.81)
RDW: 16.2 % — ABNORMAL HIGH (ref 11.5–15.5)
RDW: 16.4 % — ABNORMAL HIGH (ref 11.5–15.5)
WBC: 5.1 10*3/uL (ref 4.0–10.5)
WBC: 6.6 10*3/uL (ref 4.0–10.5)

## 2016-07-31 LAB — LACTATE DEHYDROGENASE: LDH: 160 U/L (ref 98–192)

## 2016-07-31 LAB — BASIC METABOLIC PANEL
ANION GAP: 9 (ref 5–15)
BUN: 10 mg/dL (ref 6–20)
CHLORIDE: 103 mmol/L (ref 101–111)
CO2: 26 mmol/L (ref 22–32)
Calcium: 8.8 mg/dL — ABNORMAL LOW (ref 8.9–10.3)
Creatinine, Ser: 1.17 mg/dL (ref 0.61–1.24)
GFR calc Af Amer: >60 mL/min (ref >60)
GFR calc non Af Amer: >60 mL/min (ref >60)
Glucose, Bld: 108 mg/dL — ABNORMAL HIGH (ref 65–99)
POTASSIUM: 3.7 mmol/L (ref 3.5–5.1)
Sodium: 138 mmol/L (ref 135–145)

## 2016-07-31 LAB — TECHNOLOGIST SMEAR REVIEW

## 2016-07-31 SURGERY — ENTEROSCOPY
Anesthesia: Moderate Sedation

## 2016-07-31 MED ORDER — MIDAZOLAM HCL 10 MG/2ML IJ SOLN
INTRAMUSCULAR | Status: DC | PRN
  Administered 2016-07-31 (×3): 2 mg via INTRAVENOUS

## 2016-07-31 MED ORDER — BUTAMBEN-TETRACAINE-BENZOCAINE 2-2-14 % EX AERO
INHALATION_SPRAY | CUTANEOUS | Status: DC | PRN
  Administered 2016-07-31: 2 via TOPICAL

## 2016-07-31 MED ORDER — DIPHENHYDRAMINE HCL 50 MG/ML IJ SOLN
INTRAMUSCULAR | Status: AC
  Filled 2016-07-31: qty 1

## 2016-07-31 MED ORDER — FENTANYL CITRATE (PF) 100 MCG/2ML IJ SOLN
INTRAMUSCULAR | Status: AC
  Filled 2016-07-31: qty 2

## 2016-07-31 MED ORDER — FENTANYL CITRATE (PF) 100 MCG/2ML IJ SOLN
INTRAMUSCULAR | Status: DC | PRN
  Administered 2016-07-31 (×3): 25 ug via INTRAVENOUS

## 2016-07-31 MED ORDER — MIDAZOLAM HCL 5 MG/ML IJ SOLN
INTRAMUSCULAR | Status: AC
  Filled 2016-07-31: qty 2

## 2016-07-31 NOTE — H&P (View-Only) (Signed)
GI consultation covering for Drs. William [name]   Referring Provider: Tri[facility] [name] Primary Care Physician:  William Sou, [name] Primary Gastroenterologist:  William [name]  Reason for Consultation:  anemia HPI: William [name] is a 66 y.o. male known to William [name], who was admitted last evening after labs checked at the cardiologist office showed a hemoglobin of 6.3. 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 Patient had a very recent GI evaluation when he was hospitalized in April 2018 for profound iron deficiency anemia. At that time he underwent upper endoscopy which was unremarkable. Patient then had colonoscopy which had some mild erythema at the cecum, and otherwise negative exam. Cecum was biopsied and these were unremarkable. He then had capsule endoscopy. Report is not available in Epic so we reviewed it in endo unit today. A few small SB erosions and a 27 minute transit time through SB. This did not definitely explain his anemia or heme positive stool. Patient did require iron infusions during that admission and transfusions. He has been on Niferex at home. He had been doing well until about a week and a half ago when he began noticing recurrent fatigue. His wife says he has gradually gotten more fatigued since then and began complaining of some shortness of breath with exertion. He also had some burning pain across the back of his shoulder blades which she says was very similar to how he felt last admission. He has no GI symptoms, specifically no heartburn indigestion or nausea. He had noted some burning in his upper abdomen last week as well. His stools have been dark on iron but no melena or hematochezia. Baseline hemoglobin had been about 14. Iron studies done 06/29/2016 showed a ferritin of 33  serum iron of 83 and TIBC of 429. Again this was after iron infusions. Patient has received 2 units of packed RBCs since admission, and last hemoglobin was 7.5 hematocrit of 23.7, platelets of 93. Stool for occult blood is pending.   Past Medical History:  Diagnosis Date  . AAA (abdominal aortic aneurysm) ([place]) 03/2014   3.2 cm on MR abd.  F/u aortic u/s 06/2014 showed 3.0 x 3.1 cm AAA: recheck 2 yrs recommended (followed by cardiologist)  . Bilateral renal cysts    simple (03/2014 MRI)  . CAD (coronary artery disease)   . Chronic diastolic heart failure ([place])   . Cirrhosis ([place]) 05/2016   secondary to NASH  . Diabetes mellitus with complication ([place]) 17/6160   A1c 6.8%  . Hyperlipemia, mixed    elevated LFTs when on statins.    . Hypertension    Cr bump 04/01/16 so I changed benicar-hct to benicar plain and added amlodipine 5 mg.  . Iron deficiency anemia 05/2016   Acute blood loss anemia: hospitalized, required transfusion  . Microscopic hematuria    Eval unremarkable by William [name].  [name] NASH (nonalcoholic steatohepatitis)    Fatty liver on MR abd 03/2014, + hx of elevated transaminases and bili: followed by William [name].  . Obesity     Past Surgical History:  Procedure Laterality Date  . CARDIAC CATHETERIZATION    . CARDIOVASCULAR STRESS TEST  01/17/2012; 05/04/16   Normal stress nuclear study x 2 (2018--Normal perfusion. LVEF 66% with normal wall motion. This is a low risk study).  . CERVICAL DISCECTOMY  1992  . COLONOSCOPY  N/A 05/27/2016   No site/explanation for blood loss found.  Erythematous mucosa in cecum and ascending colon--Cecal bx: normal.  Procedure: COLONOSCOPY;  Surgeon: William Ada, [name];  Location: [facility] ENDOSCOPY;  Service: Endoscopy;  Laterality: N/A;  . COLONOSCOPY W/ POLYPECTOMY  09/10/2014   Polypectomy x 2: recall 5 yrs (William [name]).  . CORONARY ANGIOPLASTY    . CORONARY ARTERY BYPASS GRAFT  03/2006  . ESOPHAGOGASTRODUODENOSCOPY N/A 05/25/2016   No site/explanation  for blood loss found.  Procedure: ESOPHAGOGASTRODUODENOSCOPY (EGD);  Surgeon: William [name];  Location: [facility] ENDOSCOPY;  Service: Endoscopy;  Laterality: N/A;  . GIVENS CAPSULE STUDY N/A 05/27/2016   Procedure: GIVENS CAPSULE STUDY;  Surgeon: William Ada, [name];  Location: [place];  Service: Endoscopy;  Laterality: N/A;  . TRANSTHORACIC ECHOCARDIOGRAM  05/25/2016   EF 60%, normal wall motion, grd II DD, mild aortic stenosis, dilated aortic root and ascending aorta.  [name] VASECTOMY  1977    Prior to Admission medications   Medication Sig Start Date End Date Taking? Authorizing Provider  fluticasone (FLONASE) 50 MCG/ACT nasal spray Place 2 sprays into both nostrils daily. Patient taking differently: Place 2 sprays into both nostrils daily as needed for allergies.  01/01/16  Yes William [name]  Garlic 2458 MG CAPS Take 1 capsule by mouth 2 (two) times daily.   Yes Provider, Historical, [name]  iron polysaccharides (NIFEREX) 150 MG capsule Take 1 capsule (150 mg total) by mouth daily. 06/22/16  Yes [name], William Blackwater, [name]  metFORMIN (GLUCOPHAGE) 500 MG tablet Take 1 tablet (500 mg total) by mouth 2 (two) times daily with a meal. Patient taking differently: Take 500 mg by mouth daily with breakfast.  04/27/16  Yes William Sine, [name]  metoprolol tartrate (LOPRESSOR) 25 MG tablet Take 0.5 tablets (12.5 mg total) by mouth 2 (two) times daily. 06/22/16  Yes [name], William Blackwater, [name]  omega-3 acid ethyl esters (LOVAZA) 1 g capsule TAKE 2 CAPSULES BY MOUTH ONCE DAILY Patient taking differently: TAKE 1 CAPSULES BY MOUTH TWICE DAILY 03/24/16  Yes William Sine, [name]  potassium chloride SA (K-DUR,KLOR-CON) 20 MEQ tablet Take 1 tablet (20 mEq total) by mouth 2 (two) times daily. 06/22/16  Yes [name], William Blackwater, [name]  spironolactone (ALDACTONE) 25 MG tablet Take 1 tablet (25 mg total) by mouth daily. 06/22/16  Yes [name], William Blackwater, [name]  torsemide (DEMADEX) 20 MG tablet Take 2 tablets (40 mg total) by mouth daily. 06/22/16   Yes [name], William Blackwater, [name]    Current Facility-Administered Medications  Medication Dose Route Frequency Provider Last Rate Last Dose  . 0.9 %  sodium chloride infusion  10 mL/hr Intravenous Once Davonna Belling, [name]      . 0.9 %  sodium chloride infusion  250 mL Intravenous PRN Black, Lezlie Octave, NP      . 0.9 %  sodium chloride infusion   Intravenous Once Elgergawy, Silver Huguenin, [name]      . acetaminophen (TYLENOL) tablet 650 mg  650 mg Oral Q6H PRN Radene Gunning, NP       Or  . acetaminophen (TYLENOL) suppository 650 mg  650 mg Rectal Q6H PRN Black, Karen M, NP      . fluticasone (FLONASE) 50 MCG/ACT nasal spray 2 spray  2 spray Each Nare Daily PRN Radene Gunning, NP      . furosemide (LASIX) injection 20 mg  20 mg Intravenous Once Elgergawy, Silver Huguenin, [name]      . furosemide (LASIX) injection  20 mg  20 mg Intravenous Once Elgergawy, Silver Huguenin, [name]      . insulin aspart (novoLOG) injection 0-9 Units  0-9 Units Subcutaneous TID WC Black, Lezlie Octave, NP      . iron polysaccharides (NIFEREX) capsule 150 mg  150 mg Oral Daily Radene Gunning, NP   150 mg at 07/30/16 5035  . metoprolol tartrate (LOPRESSOR) tablet 12.5 mg  12.5 mg Oral BID Dyanne Carrel M, NP   12.5 mg at 07/30/16 4656  . ondansetron (ZOFRAN) tablet 4 mg  4 mg Oral Q6H PRN Radene Gunning, NP       Or  . ondansetron [facility]) injection 4 mg  4 mg Intravenous Q6H PRN Radene Gunning, NP      . sodium chloride flush (NS) 0.9 % injection 3 mL  3 mL Intravenous Q12H Radene Gunning, NP   3 mL at 07/29/16 2206  . sodium chloride flush (NS) 0.9 % injection 3 mL  3 mL Intravenous Q12H Radene Gunning, NP   3 mL at 07/29/16 2205  . sodium chloride flush (NS) 0.9 % injection 3 mL  3 mL Intravenous PRN Black, Karen M, NP      . spironolactone (ALDACTONE) tablet 25 mg  25 mg Oral Daily Radene Gunning, NP   25 mg at 07/30/16 8127  . torsemide (DEMADEX) tablet 40 mg  40 mg Oral Daily Radene Gunning, NP   40 mg at 07/30/16 5170    Allergies as of 07/29/2016 -  Review Complete 07/29/2016  Allergen Reaction Noted  . Statins Other (See Comments) 05/22/2016    Family History  Problem Relation Age of Onset  . Hypertension Mother   . Cancer - Other Mother        liver cancer  . Heart disease Father   . Heart attack Father   . Cancer - Lung Father   . Diabetes Father   . Liver disease Sister   . Heart disease Brother        CABG 20 YEARS AGO  . Hypertension Brother   . Mesothelioma Brother        HALF-BROTHER  . Cancer - Lung Brother   . Cancer - Lung Sister        HALF-SISTER  . Cancer - Other Sister        KIDNEY  (HALF-SISTER)  . Liver disease Brother   . Heart disease Brother        CABG-2012    Social History   Social History  . Marital status: Married    Spouse name: N/A  . Number of children: N/A  . Years of education: N/A   Occupational History  . Not on file.   Social History Main Topics  . Smoking status: Former Smoker    Types: Cigarettes  . Smokeless tobacco: Never Used     Comment: QUIT I 1983  . Alcohol use No  . Drug use: No  . Sexual activity: Not on file   Other Topics Concern  . Not on file   Social History Narrative   Married, 3 grown children, 3 GCs.   Educ: 10th grade.   Occupation: Retired Brewing technologist.   Tob: quit 1983, smoked about 20 pack-yr hx prior.   No alcohol.    Review of Systems: Pertinent positive and negative review of systems were noted in the above HPI section.  All other review of systems was otherwise negative. Physical Exam: Vital signs in last  24 hours: Temp:  [97.7 F (36.5 C)-98.6 F (37 C)] 98.2 F (36.8 C) (06/23 0446) Pulse Rate:  [75-95] 95 (06/23 0446) Resp:  [16-24] 18 (06/23 0446) BP: (106-135)/(51-81) 111/61 (06/23 0446) SpO2:  [95 %-100 %] 97 % (06/23 0446) Weight:  [244 lb 8 oz (110.9 kg)-247 lb (112 kg)] 244 lb 8 oz (110.9 kg) (06/23 0446) Last BM Date: 07/29/16 General:   Alert,  Well-developed, well-nourished, pleasant and cooperative in NAD,Sallow  complexion Head:  Normocephalic and atraumatic. Eyes:  Sclera clear, no icterus.   Conjunctiva pale Ears:  Normal auditory acuity. Nose:  No deformity, discharge,  or lesions. Mouth:  No deformity or lesions.   Neck:  Supple; no masses or thyromegaly. Lungs:  Clear throughout to auscultation.   No wheezes, crackles, or rhonchi.  Heart:  Regular rate and rhythm; no murmurs, clicks, rubs,  or gallops. Sternal incisional scar Abdomen:  Soft,nontender, BS active,nonpalp mass or hsm.   Rectal:  Deferred  Msk:  Symmetrical without gross deformities. . Pulses:  Normal pulses noted. Extremities:  Without clubbing or edema. Neurologic:  Alert and  oriented x4;  grossly normal neurologically. Skin:  Intact without significant lesions or rashes.. Psych:  Alert and cooperative. Normal mood and affect.  Intake/Output from previous day: 06/22 0701 - 06/23 0700 In: 678 [P.O.:60; I.V.:6; Blood:612] Out: -  Intake/Output this shift: No intake/output data recorded.  Lab Results:  Recent Labs  07/29/16 1027 07/29/16 2133 07/30/16 0337  WBC 5.4 5.9 5.3  HGB 6.3* 8.0* 7.5*  HCT 20.0* 25.2* 23.7*  PLT 108* 94* 93*   BMET  Recent Labs  07/28/16 1416 07/29/16 1027 07/30/16 0337  NA 137 134* 137  K 4.1 3.9 3.6  CL 100 103 104  CO2 23 25 26   GLUCOSE 113* 119* 114*  BUN 13 10 13   CREATININE 1.08 1.08 1.22  CALCIUM 9.0 8.6* 8.4*   LFT  Recent Labs  07/30/16 0337  PROT 5.9*  ALBUMIN 2.6*  AST 41  ALT 27  ALKPHOS 73  BILITOT 6.1*   PT/INR  Recent Labs  07/29/16 1200  LABPROT 19.1*  INR 1.58    IMPRESSION:  #72  66 year old white male with recurrent profound anemia, symptomatic with fatigue shortness of breath and burning pain across his shoulders. No overt GI bleeding. Patient has undergone recent GI evaluation for similar presentation with severe iron deficiency anemia in April 2018. Evaluation with EGD, colonoscopy and capsule endoscopy with no definite etiology found.  He did have some erosions noted in the small bowel on capsule endoscopy and the SB transit time was very rapid.  #2 history of iron deficiency-most recent iron studies are normal post iron infusions #3 coronary artery disease status post CABG 2008 #4 congestive heart failure #5 NASH cirrhosis-no varices #6 thrombocytopenia chronic #7 adult-onset diabetes mellitus #8 abdominal aortic aneurysm 3.2 cm #9 splenomegaly #10 multiple gallstones  PLAN: #1 Full liquid diet today #2 2 more units of packed RBCs today, serial hemoglobins, follow-up on stool for occult blood. #3 Small bowel enteroscopy tomorrow.   Amy Esterwood  07/30/2016, 9:46 AM     Attending physician's note   I have taken a history, examined the patient and reviewed the chart. I agree with the Advanced Practitioner's note, impression and recommendations.   Recurrent anemia, suspicious for chronic, occult GI blood loss. Awaiting stool hemoccult. Iron deficiency has been corrected. Anemia being corrected with transfusions to keep Hb > 8.   Push enteroscopy tomorrow. If no source  noted on enteroscopy consider repeat CE study given the very rapid transit time on prior CE. Defer further plans to Drs. Collene [name] and [name] who will resume care on Monday.   Lucio Edward, [name] Marval Regal [phone] Mon-Fri 8a-5p [phone] after 5p, weekends, holidays

## 2016-07-31 NOTE — Progress Notes (Signed)
PROGRESS NOTE                                                                                                                                                                                                             Patient Demographics:    William [name], is a 66 y.o. male, DOB - Jun 16, 1950, [id number]  Admit date - 07/29/2016   Admitting Physician Elwin Mocha, MD  Outpatient Primary MD for the patient is McGowen, Adrian Blackwater, MD  LOS - 1  Outpatient Specialists: GI Dr. [name]  Chief Complaint  Patient presents with  . Abnormal Lab  . Shortness of Breath       Brief Narrative   66 y.o. male with medical history significant for AAA, diabetes, high blood pressure, microscopic hematuria, Karlene Lineman cirrhosis, hyperlipidemia coronary artery disease status post CABG 2008, CHF persistent anemia presents to the emergency department as instructed by cardiology. Initial evaluation reveals worsening anemia with a hemoglobin of 6.3.   Subjective:    William [name] today Denies any headache, chest pain, shortness of breath, no lightheadedness or weakness, reports he is feeling better, report dark-colored urine in a.m. [name].   Assessment  & Plan :    Principal Problem:   Anemia Active Problems:   Hx of CABG   Essential hypertension   Mixed hyperlipidemia   Moderate obesity   History of GI bleed   Non-insulin treated type 2 diabetes mellitus (HCC)   Chronic diastolic heart failure (HCC)   Weakness   Aortic stenosis, mild   Thrombocytopenia (HCC)   Symptomatic anemia   Anemia. Symptomatic.  - Patient with recurrent profound anemia, workup significant for iron deficiency anemia in the past, he had extensive workup including recent colonoscopy, endoscopy, capsule endoscopy by Dr. [name] during recent admission, significant only for lesion/possible AVM and small bowel capsule endoscopy . - Required 4 units PRBC transfusion, hemoglobin 7.5 > 9.5 this a.m.  -  GI input greatly appreciated , went for small bowel enteroscopy today by Dr. Fuller Plan, significant for grade 1 esophageal varices, and portal hypertensive gastropathy, with erythematous mucosa in the prepyloric region, but otherwise no evidence of active bleed. - Monitor H&H closely and transfuse as needed. - Patient report dark-colored urine in a.m., and workup in the past including mildly elevated LDH, low normal haptoglobin, normal reticulocyte count, paroxysmal nocturnal  hemoglobinuria  would be obscuration here, patient has follow-up with hematology this coming Tuesday, will check urinalysis in a.m.[name] Check LFTs, haptoglobin and LDH -hematology follow up OP. Appointment set for 6/26 with Dr Irene Limbo at 2:20.  Chronic Diastolic heart failure. - Appears to be euvolemic currently, Appears compensated. EF recently 72-09% grade 2 diastolic dysfunction. BNPwithin the limits of normal. Chest x-ray without pulmonary edema. - Continue Home medications include Toprol, spironolactone, torsemide. Chart review indicates discharge weight 263 pounds - Daily weights, Monitor intake and output  Diabetes type 2.  - Continue to hold oral hypoglycemic agents, continue with insulin sliding scale   CAD status post coronary bypass graft 2008. - No chest pain. Recent stress test low risk - Continue home meds  Hypertension.  - Blood pressure acceptable, continue with metoprolol   Hyperbilirubinemia. - Chronic.  Hx of liver cirrhosis.  Total bili 3.4 on admission which is trending down from 2 months ago. Repeat LFTs today and follow in direct bili,  Thrombocytopenia.  - Likely related to above. Hx of same -monitor   Code Status : Full  Family Communication  : Wife at bedside  Disposition Plan  : Home when stable  Consults  :  GI  Procedures  : None  DVT Prophylaxis  : SCDs   Lab Results  Component Value Date   PLT 96 (L) 07/31/2016    Antibiotics  :    Anti-infectives    None         Objective:   Vitals:   07/31/16 0900 07/31/16 0905 07/31/16 0910 07/31/16 0920  BP: 131/75 (!) 146/85 128/74 (!) 100/57  Pulse: 74 80 80 76  Resp: 17 20 20 15   Temp:      TempSrc:      SpO2: 97% 97% 98% 94%  Weight:      Height:        Wt Readings from Last 3 Encounters:  07/31/16 111.1 kg (245 lb)  07/28/16 112.4 kg (247 lb 12.8 oz)  06/29/16 113.9 kg (251 lb)     Intake/Output Summary (Last 24 hours) at 07/31/16 1330 Last data filed at 07/31/16 1320  Gross per 24 hour  Intake             1221 ml  Output             3125 ml  Net            -1904 ml     Physical Exam  Awake, alert oriented 3, no apparent distress,  Low-density bilaterally, clear to auscultation, no use of accessory muscle  Regular rate and rhythm, no rubs, murmur, or gallops  Abdomen soft, nontender, nondistended, bowel sounds present Extremities with no edema, clubbing or cyanosis Motor nonfocal, intact to treatment and insight    Data Review:    CBC  Recent Labs Lab 07/28/16 1416 07/29/16 1027 07/29/16 2133 07/30/16 0337 07/30/16 1957 07/31/16 0614  WBC 6.3 5.4 5.9 5.3  --  5.1  HGB 7.1* 6.3* 8.0* 7.5* 9.3* 9.5*  HCT 21.7* 20.0* 25.2* 23.7* 28.7* 29.2*  PLT 125* 108* 94* 93*  --  96*  MCV 83 84.0 84.8 85.9  --  84.6  MCH 27.2 26.5 26.9 27.2  --  27.5  MCHC 32.7 31.5 31.7 31.6  --  32.5  RDW 17.4* 17.6* 16.7* 17.1*  --  16.2*    Chemistries   Recent Labs Lab 07/28/16 1416 07/29/16 1027 07/29/16 1200 07/30/16 0337 07/31/16 0614  NA 137  134*  --  137 138  K 4.1 3.9  --  3.6 3.7  CL 100 103  --  104 103  CO2 23 25  --  26 26  GLUCOSE 113* 119*  --  114* 108*  BUN 13 10  --  13 10  CREATININE 1.08 1.08  --  1.22 1.17  CALCIUM 9.0 8.6*  --  8.4* 8.8*  MG  --   --  2.0  --   --   AST  --  44*  --  41  --   ALT  --  30  --  27  --   ALKPHOS  --  79  --  73  --   BILITOT  --  3.4*  --  6.1*  --     ------------------------------------------------------------------------------------------------------------------ No results for input(s): CHOL, HDL, LDLCALC, TRIG, CHOLHDL, LDLDIRECT in the last 72 hours.  Lab Results  Component Value Date   HGBA1C 5.4 06/29/2016   ------------------------------------------------------------------------------------------------------------------ No results for input(s): TSH, T4TOTAL, T3FREE, THYROIDAB in the last 72 hours.  Invalid input(s): FREET3 ------------------------------------------------------------------------------------------------------------------  Recent Labs  07/29/16 1200  RETICCTPCT 4.3*    Coagulation profile  Recent Labs Lab 07/29/16 1200  INR 1.58    No results for input(s): DDIMER in the last 72 hours.  Cardiac Enzymes No results for input(s): CKMB, TROPONINI, MYOGLOBIN in the last 168 hours.  Invalid input(s): CK ------------------------------------------------------------------------------------------------------------------    Component Value Date/Time   BNP 343.4 (H) 05/26/2016 0307   BNP 574.6 (H) 05/04/2016 1012    Inpatient Medications  Scheduled Meds: . insulin aspart  0-9 Units Subcutaneous TID WC  . iron polysaccharides  150 mg Oral Daily  . metoprolol tartrate  12.5 mg Oral BID  . sodium chloride flush  3 mL Intravenous Q12H  . sodium chloride flush  3 mL Intravenous Q12H  . spironolactone  25 mg Oral Daily  . torsemide  40 mg Oral Daily   Continuous Infusions: . sodium chloride    . sodium chloride     PRN Meds:.sodium chloride, acetaminophen **OR** acetaminophen, fluticasone, ondansetron **OR** ondansetron (ZOFRAN) IV, sodium chloride flush  Micro Results No results found for this or any previous visit (from the past 240 hour(s)).  Radiology Reports Dg Chest 2 View  Result Date: 07/29/2016 CLINICAL DATA:  Shortness of breath, weakness EXAM: CHEST  2 VIEW COMPARISON:  05/23/2016  FINDINGS: Prior CABG. Heart is normal size. Mild interstitial prominence and areas of scarring in the lungs bilaterally. No acute airspace opacities or effusions. No acute bony abnormality. IMPRESSION: Interstitial prominence and areas of scarring.  No active disease. Electronically Signed   By: Rolm Baptise M.D.   On: 07/29/2016 11:16   US Abdomen Complete  Result Date: 07/29/2016 CLINICAL DATA:  Anemia for 1 day.  History of thrombocytopenia. EXAM: ABDOMEN ULTRASOUND COMPLETE COMPARISON:  Ultrasound, 05/24/2016 FINDINGS: Gallbladder: Multiple gallstones, mobile and dependent. Largest measures 1 cm. No wall thickening or pericholecystic fluid. Common bile duct: Diameter: 4.3 mm Liver: Coarsened echogenicity with no discrete mass or focal lesion. Nodular contour. Hepatopetal flow documented in the portal vein. IVC: No abnormality visualized. Pancreas: Visualized portion unremarkable. Spleen: Enlarged measuring 12.6 x 15.4 x 8.9 cm, volume calculated at 902 mL. Spleen measures larger than on the prior study where the volume was calculated at 494 mL. No discrete splenic mass or focal lesion. Right Kidney: Length: 12.3 cm. Normal parenchymal echogenicity. Lateral midpole cyst measuring 3.8 x 3.1 x 3.0 cm. This is stable.  No other right renal masses, no stones and no hydronephrosis. Left Kidney: Length: 12.1 cm. Normal parenchymal echogenicity. 2.8 cm midpole cyst. 1.4 cm lower pole cyst. No other masses, no stones and no hydronephrosis. Abdominal aorta: Dilated to 3.0 x 3.1 cm. Atherosclerotic calcifications noted along the wall. This aneurysm was not evident on the prior ultrasound, presumably due to bowel gas obscuring the lower abdominal aorta. Other findings: None. IMPRESSION: 1. No acute findings. 2. Splenomegaly. The spleen has increased in size the prior study increasing from 494 mL to 902 mL. 3. Liver appearance suggests cirrhosis similar to the prior exam. No liver mass or focal lesion. Hepatopetal flow in  the portal vein. 4. Renal cysts. 5. 3.1 cm abdominal aortic aneurysm. Recommend followup by ultrasound in 3 years. This recommendation follows ACR consensus guidelines: White Paper of the ACR Incidental Findings Committee II on Vascular Findings. Natasha Mead Coll Radiol 2013; 10:789-794 Electronically Signed   By: Lajean Manes M.D.   On: 07/29/2016 20:35      ELGERGAWY, DAWOOD M.D on 07/31/2016 at 1:30 PM  Between 7am to 7pm - Pager - [phone]  After 7pm go to [email or web address] - password [facility]  [facility] -  Office  [phone]

## 2016-07-31 NOTE — Interval H&P Note (Signed)
History and Physical Interval Note:  07/31/2016 8:35 AM  William [name]  has presented today for surgery, with the diagnosis of anemia  The various methods of treatment have been discussed with the patient and family. After consideration of risks, benefits and other options for treatment, the patient has consented to  Procedure(s): ENTEROSCOPY (N/A) as a surgical intervention .  The patient's history has been reviewed, patient examined, no change in status, stable for surgery.  I have reviewed the patient's chart and labs.  Questions were answered to the patient's satisfaction.     Pricilla Riffle. Fuller Plan

## 2016-07-31 NOTE — Op Note (Signed)
[facility] Patient Name: William [name] Procedure Date : 07/31/2016 MRN: [id number] Attending MD: Ladene Artist , MD Date of Birth: March 08, 1950 CSN: [id number] Age: 66 Admit Type: Inpatient Procedure:                Small bowel enteroscopy Indications:              Anemia Providers:                Pricilla Riffle. Fuller Plan, MD, Carolynn Comment, RN, Cherylynn Ridges, Technician, Nevin Bloodgood, Technician Referring MD:             [facility] Medicines:                Fentanyl 75 micrograms IV, Midazolam 6 mg IV Complications:            No immediate complications. Estimated Blood Loss:     Estimated blood loss: none. Procedure:                Pre-Anesthesia Assessment:                           - Prior to the procedure, a History and Physical                            was performed, and patient medications and                            allergies were reviewed. The patient's tolerance of                            previous anesthesia was also reviewed. The risks                            and benefits of the procedure and the sedation                            options and risks were discussed with the patient.                            All questions were answered, and informed consent                            was obtained. Prior Anticoagulants: The patient has                            taken no previous anticoagulant or antiplatelet                            agents. ASA Grade Assessment: II - A patient with                            mild systemic disease. After reviewing the risks  and benefits, the patient was deemed in                            satisfactory condition to undergo the procedure.                           After obtaining informed consent, the endoscope was                            passed under direct vision. Throughout the                            procedure, the patient's blood pressure, pulse,  and                            oxygen saturations were monitored continuously. The                            EC-[id number] ([id number]) scope was introduced through                            the mouth and advanced to the proximal jejunum. The                            small bowel enteroscopy was accomplished without                            difficulty. The patient tolerated the procedure                            well. Scope In: Scope Out: Findings:      A grade I varix was found in the mid/distal esophagus. It was 4 mm in       diameter.      The exam of the esophagus was otherwise normal.      Mild portal hypertensive gastropathy was found in the gastric fundus and       in the gastric body.      Patchy mildly erythematous mucosa without bleeding was found in the       prepyloric region of the stomach.      The exam of the stomach was otherwise normal.      There was no evidence of significant pathology in the entire examined       duodenum.      There was no evidence of significant pathology in the proximal jejunum. Impression:               - Grade I esophageal varices.                           - Portal hypertensive gastropathy.                           - Erythematous mucosa in the prepyloric region of                            the stomach.                           -  Normal examined duodenum.                           - The examined portion of the jejunum was normal.                           - No specimens collected. Moderate Sedation:      Moderate (conscious) sedation was administered by the endoscopy nurse       and supervised by the endoscopist. The following parameters were       monitored: oxygen saturation, heart rate, blood pressure, respiratory       rate, EKG, adequacy of pulmonary ventilation, and response to care.       Total physician intraservice time was 22 minutes. Recommendation:           - Return to hospital ward for ongoing care.                            - Consider starting a nonselective B blocker -                            defer decision and further plan to Drs. [name] and                            [name] who will resume care tomorrow. Procedure Code(s):        --- Professional ---                           [phone], Small intestinal endoscopy, enteroscopy                            beyond second portion of duodenum, not including                            ileum; diagnostic, including collection of                            specimen(s) by brushing or washing, when performed                            (separate procedure)                           99152, Moderate sedation services provided by the                            same physician or other qualified health care                            professional performing the diagnostic or                            therapeutic service that the sedation supports,                            requiring the presence of  an independent trained                            observer to assist in the monitoring of the                            patient's level of consciousness and physiological                            status; initial 15 minutes of intraservice time,                            patient age 11 years or older Diagnosis Code(s):        --- Professional ---                           I85.00, Esophageal varices without bleeding                           K76.6, Portal hypertension                           K31.89, Other diseases of stomach and duodenum                           D64.9, Anemia, unspecified CPT copyright 2016 [employer]. All rights reserved. The codes documented in this report are preliminary and upon coder review may  be revised to meet current compliance requirements. Ladene Artist, MD 07/31/2016 9:15:10 AM This report has been signed electronically. Number of Addenda: 0

## 2016-08-01 ENCOUNTER — Encounter: Payer: Self-pay | Admitting: Family Medicine

## 2016-08-01 LAB — COMPREHENSIVE METABOLIC PANEL
ALT: 27 U/L (ref 17–63)
AST: 38 U/L (ref 15–41)
Albumin: 2.6 g/dL — ABNORMAL LOW (ref 3.5–5.0)
Alkaline Phosphatase: 75 U/L (ref 38–126)
Anion gap: 8 (ref 5–15)
BUN: 9 mg/dL (ref 6–20)
CHLORIDE: 102 mmol/L (ref 101–111)
CO2: 25 mmol/L (ref 22–32)
Calcium: 8.6 mg/dL — ABNORMAL LOW (ref 8.9–10.3)
Creatinine, Ser: 1.07 mg/dL (ref 0.61–1.24)
Glucose, Bld: 107 mg/dL — ABNORMAL HIGH (ref 65–99)
POTASSIUM: 3.6 mmol/L (ref 3.5–5.1)
SODIUM: 135 mmol/L (ref 135–145)
Total Bilirubin: 6.3 mg/dL — ABNORMAL HIGH (ref 0.3–1.2)
Total Protein: 6.1 g/dL — ABNORMAL LOW (ref 6.5–8.1)

## 2016-08-01 LAB — URINALYSIS, ROUTINE W REFLEX MICROSCOPIC
BILIRUBIN URINE: NEGATIVE
Glucose, UA: NEGATIVE mg/dL
HGB URINE DIPSTICK: NEGATIVE
KETONES UR: NEGATIVE mg/dL
Leukocytes, UA: NEGATIVE
Nitrite: NEGATIVE
PROTEIN: NEGATIVE mg/dL
SPECIFIC GRAVITY, URINE: 1.01 (ref 1.005–1.030)
pH: 5 (ref 5.0–8.0)

## 2016-08-01 LAB — CBC
HCT: 30.4 % — ABNORMAL LOW (ref 39.0–52.0)
Hemoglobin: 9.6 g/dL — ABNORMAL LOW (ref 13.0–17.0)
MCH: 27.1 pg (ref 26.0–34.0)
MCHC: 31.6 g/dL (ref 30.0–36.0)
MCV: 85.9 fL (ref 78.0–100.0)
PLATELETS: 104 10*3/uL — AB (ref 150–400)
RBC: 3.54 MIL/uL — ABNORMAL LOW (ref 4.22–5.81)
RDW: 16.2 % — AB (ref 11.5–15.5)
WBC: 5.8 10*3/uL (ref 4.0–10.5)

## 2016-08-01 LAB — HAPTOGLOBIN: HAPTOGLOBIN: 15 mg/dL — AB (ref 34–200)

## 2016-08-01 LAB — GLUCOSE, CAPILLARY
GLUCOSE-CAPILLARY: 112 mg/dL — AB (ref 65–99)
Glucose-Capillary: 217 mg/dL — ABNORMAL HIGH (ref 65–99)

## 2016-08-01 LAB — OCCULT BLOOD X 1 CARD TO LAB, STOOL: FECAL OCCULT BLD: POSITIVE — AB

## 2016-08-01 NOTE — Progress Notes (Signed)
Hypoglycemic Event  CBG: 65  Treatment: 15 GM carbohydrate snack  Symptoms: None  Follow-up CBG: Time:2323 CBG Result:118  Possible Reasons for Event: Unknown    William [name]

## 2016-08-01 NOTE — Consult Note (Signed)
   [facility] Inpatient Consult   08/01/2016  DUBLIN GRAYER April 03, 1950 [id number]    Mr. Hakanson discharged prior to bedside visit on behalf of [place] to Wellness program for [place] employees/dependents with [facility] insurance.   Marthenia Rolling, MSN-Ed, RN,BSN [facility] Liaison [phone]

## 2016-08-01 NOTE — Progress Notes (Signed)
NURSING PROGRESS NOTE  William [name] [id number] Discharge Data: 08/01/2016 2:15 PM Attending Provider: Albertine Patricia, MD TRR:[id number], Adrian Blackwater, MD     Shona Needles to be D/C'd Home per MD order.  Discussed with the patient the After Visit Summary and all questions fully answered. All IV's discontinued with no bleeding noted. All belongings returned to patient for patient to take home.   Last Vital Signs:  Blood pressure 109/65, pulse 74, temperature 98.6 F (37 C), resp. rate 18, height 5' 8.5" (1.74 m), weight 110.7 kg (244 lb), SpO2 97 %.  Discharge Medication List Allergies as of 08/01/2016      Reactions   Statins Other (See Comments)   Liver enzymes go up whenever on them      Medication List    TAKE these medications   fluticasone 50 MCG/ACT nasal spray Commonly known as:  FLONASE Place 2 sprays into both nostrils daily. What changed:  when to take this  reasons to take this   Garlic 3833 MG Caps Take 1 capsule by mouth 2 (two) times daily.   iron polysaccharides 150 MG capsule Commonly known as:  NIFEREX Take 1 capsule (150 mg total) by mouth daily.   metFORMIN 500 MG tablet Commonly known as:  GLUCOPHAGE Take 1 tablet (500 mg total) by mouth 2 (two) times daily with a meal. What changed:  when to take this   metoprolol tartrate 25 MG tablet Commonly known as:  LOPRESSOR Take 0.5 tablets (12.5 mg total) by mouth 2 (two) times daily.   omega-3 acid ethyl esters 1 g capsule Commonly known as:  LOVAZA TAKE 2 CAPSULES BY MOUTH ONCE DAILY What changed:  See the new instructions.   potassium chloride SA 20 MEQ tablet Commonly known as:  K-DUR,KLOR-CON Take 1 tablet (20 mEq total) by mouth 2 (two) times daily.   spironolactone 25 MG tablet Commonly known as:  ALDACTONE Take 1 tablet (25 mg total) by mouth daily.   torsemide 20 MG tablet Commonly known as:  DEMADEX Take 2 tablets (40 mg total) by mouth daily.

## 2016-08-01 NOTE — Discharge Instructions (Signed)
Follow with Primary MD McGowen, Adrian Blackwater, MD in 7 days   Get CBC, CMP, checked  by Primary MD next visit.    Activity: As tolerated with Full fall precautions use walker/cane & assistance as needed   Disposition Home    Diet: Heart Healthy , car modified, with feeding assistance and aspiration precautions.  For Heart failure patients - Check your Weight same time everyday, if you gain over 2 pounds, or you develop in leg swelling, experience more shortness of breath or chest pain, call your Primary MD immediately. Follow Cardiac Low Salt Diet and 1.5 lit/day fluid restriction.   On your next visit with your primary care physician please Get Medicines reviewed and adjusted.   Please request your Prim.MD to go over all Hospital Tests and Procedure/Radiological results at the follow up, please get all Hospital records sent to your Prim MD by signing hospital release before you go home.   If you experience worsening of your admission symptoms, develop shortness of breath, life threatening emergency, suicidal or homicidal thoughts you must seek medical attention immediately by calling 911 or calling your MD immediately  if symptoms less severe.  You Must read complete instructions/literature along with all the possible adverse reactions/side effects for all the Medicines you take and that have been prescribed to you. Take any new Medicines after you have completely understood and accpet all the possible adverse reactions/side effects.   Do not drive, operating heavy machinery, perform activities at heights, swimming or participation in water activities or provide baby sitting services if your were admitted for syncope or siezures until you have seen by Primary MD or a Neurologist and advised to do so again.  Do not drive when taking Pain medications.    Do not take more than prescribed Pain, Sleep and Anxiety Medications  Special Instructions: If you have smoked or chewed Tobacco  in the  last 2 yrs please stop smoking, stop any regular Alcohol  and or any Recreational drug use.  Wear Seat belts while driving.   Please note  You were cared for by a hospitalist during your hospital stay. If you have any questions about your discharge medications or the care you received while you were in the hospital after you are discharged, you can call the unit and asked to speak with the hospitalist on call if the hospitalist that took care of you is not available. Once you are discharged, your primary care physician will handle any further medical issues. Please note that NO REFILLS for any discharge medications will be authorized once you are discharged, as it is imperative that you return to your primary care physician (or establish a relationship with a primary care physician if you do not have one) for your aftercare needs so that they can reassess your need for medications and monitor your lab values.

## 2016-08-01 NOTE — Discharge Summary (Signed)
William [name], is a 66 y.o. male  DOB November 24, 1950  MRN [id number].  Admission date:  07/29/2016  Admitting Physician  Elwin Mocha, MD  Discharge Date:  08/01/2016   Primary MD  Tammi Sou, MD  Recommendations for primary care physician for things to follow:  - Please check CBC, BMP, LFTs during next visit - Patient instructed to keep his appointment with hematology tomorrow guarding workup for hemolytic anemia, follow on final haptoglobin results.  Admission Diagnosis  Anemia [D64.9] Symptomatic anemia [D64.9] Gastrointestinal hemorrhage, unspecified gastrointestinal hemorrhage type [K92.2]   Discharge Diagnosis  Anemia [D64.9] Symptomatic anemia [D64.9] Gastrointestinal hemorrhage, unspecified gastrointestinal hemorrhage type [K92.2]    Principal Problem:   Anemia Active Problems:   Hx of CABG   Essential hypertension   Mixed hyperlipidemia   Moderate obesity   History of GI bleed   Non-insulin treated type 2 diabetes mellitus (HCC)   Chronic diastolic heart failure (HCC)   Weakness   Aortic stenosis, mild   Thrombocytopenia (HCC)   Symptomatic anemia      Past Medical History:  Diagnosis Date  . AAA (abdominal aortic aneurysm) ([place]) 03/2014   3.2 cm on MR abd.  F/u aortic u/s 06/2014 showed 3.0 x 3.1 cm AAA: recheck 2 yrs recommended (followed by cardiologist)  . Bilateral renal cysts    simple (03/2014 MRI)  . CAD (coronary artery disease)   . Chronic diastolic heart failure ([place])   . 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 Diabetes mellitus with complication ([place]) 63/3354   A1c 6.8%  . Hyperlipemia, mixed    elevated LFTs when on statins.    . Hypertension    Cr bump 04/01/16 so I changed benicar-hct to benicar plain and added amlodipine 5 mg.  . Iron deficiency anemia 05/2016   Acute blood loss anemia: hospitalized, required transfusion  . Microscopic  hematuria    Eval unremarkable by Dr. Eulogio Ditch.  [name] NASH (nonalcoholic steatohepatitis)    Fatty liver on MR abd 03/2014, + hx of elevated transaminases and bili: followed by Dr. [name].  . Obesity     Past Surgical History:  Procedure Laterality Date  . CARDIAC CATHETERIZATION    . CARDIOVASCULAR STRESS TEST  01/17/2012; 05/04/16   Normal stress nuclear study x 2 (2018--Normal perfusion. LVEF 66% with normal wall motion. This is a low risk study).  . CERVICAL DISCECTOMY  1992  . COLONOSCOPY N/A 05/27/2016   No site/explanation for blood loss found.  Erythematous mucosa in cecum and ascending colon--Cecal bx: normal.  Procedure: COLONOSCOPY;  Surgeon: Carol Ada, MD;  Location[facility] END[facility]COPY;  Service: Endoscopy;  Laterality: N/A;  . COLONOSCOPY W/ POLYPECTOMY  09/10/2014   Polypectomy x 2: recall 5 yrs (Dr. [name]).  . CORONARY ANGIOPLASTY    . CORONARY ARTERY BYPASS GRAFT  03/2006  . ENTEROSCOPY N/A 07/31/2016   Procedure: ENTEROSCOPY;  Surgeon: Ladene Artist, MD;  Location: [facility] ENDOSCOPY;  Service: Endoscopy;  Laterality: N/A;  . ESOPHAGOGASTRODUODENOSCOPY N/A 05/25/2016  No site/explanation for blood loss found.  Procedure: ESOPHAGOGASTRODUODENOSCOPY (EGD);  Surgeon: Juanita Craver, MD;  Location: [facility] ENDOSCOPY;  Service: Endoscopy;  Laterality: N/A;  . GIVENS CAPSULE STUDY N/A 05/27/2016   Procedure: GIVENS CAPSULE STUDY;  Surgeon: Carol Ada, MD;  Location: [facility];  Service: Endoscopy;  Laterality: N/A;  . TRANSTHORACIC ECHOCARDIOGRAM  05/25/2016   EF 60%, normal wall motion, grd II DD, mild aortic stenosis, dilated aortic root and ascending aorta.  [name] VASECTOMY  1977       History of present illness and  Hospital Course:     Kindly see H&P for history of present illness and admission details, please review complete Labs, Consult reports and Test reports for all details in brief  HPI  from the history and physical done on the day of admission 07/29/2016 HPI: William [name] is  a very pleasant 66 y.o. male with medical history significant for AAA, diabetes, high blood pressure, microscopic hematuria, Karlene Lineman cirrhosis, hyperlipidemia coronary artery disease status post CABG 2008, CHF persistent anemia presents to the emergency department as instructed by cardiology. Initial evaluation reveals worsening anemia with a hemoglobin of 6.3.  Information is obtained from the patient and his wife who is a Marine scientist and at the bedside. Patient was in the hospital 2 months ago for acute blood loss anemia presumably related to a GI bleed and acute pulmonary edema related to CHF in the setting of acute anemia. He was evaluated by GI who underwent capsule endoscopy conducted to follow-up with hematology.. Doing well until about a week ago he developed gradual worsening shortness of breath and generalized weakness. He reports that when he gets up in the morning he feels "good". By the middle of the afternoon he feels the fatigue coming on and is more short of breath with activity. Wife reports associated symptoms include anorexia and not sleeping well. Patient reports waking daily and denies any weight gain. He denies headache dizziness syncope or near-syncope. He denies abdominal pain nausea vomiting diarrhea constipation melena or bright red blood per rectum. He denies dysuria hematuria frequency or urgency. His chest pain palpitations lower extremity edema  He went to see his cardiologist yesterday and had blood work done. Hemoglobin noted to be 7 his baseline is closer to 11. Cardiology office called and instructed him to come to the emergency department.    ED Course: In the emergency department he is afebrile hemodynamically stable and not hypoxic.    Hospital Course  65 y.o.malewith medical history significant for AAA, diabetes, high blood pressure, microscopic hematuria, Karlene Lineman cirrhosis, hyperlipidemia coronary artery disease status post CABG 2008, CHF persistent anemia presents to  the emergency department as instructed by cardiology. Initial evaluation reveals worsening anemia with a hemoglobin of 6.3., Series total of 4 units during hospital stay, with hemoglobin of 9.6 on discharge  Symptomatic anemia -  Patient with recurrent profound anemia, workup significant for iron deficiency anemia in the past, he had extensive workup including recent colonoscopy, endoscopy, capsule endoscopy by Dr. [name] during recent admission in April, significant only for lesion/possible AVM and small bowel capsule endoscopy , presents again this time with hemoglobin of 6.3, with Hemoccult-positive stool(but she was on iron),, went for small bowel enteroscopy today by Dr. Fuller Plan, significant for grade 1 esophageal varices, and portal hypertensive gastropathy, with erythematous mucosa in the prepyloric region, but otherwise no evidence of active bleed, he was instructed to continue PPI and iron Supplements of discharge, discussed with GI Dr. [name] will follow as  an outpatient. - Required 4 units PRBC transfusion, hemoglobin 7.5 > 9.6 this a.m.  - Patient to follow with hematology Dr Irene Limbo, this appointment has been made during last admission, this is suspicious for hemolytic anemia, as patient with elevated and direct bilirubin, had low normal haptoglobin during previous admission, but he is with normal LDH currently, and negative Coombs test previous admission, he reported some dark urine in the morning, but normal urinalysis., D/W Dr Irene Limbo.  Hyperbilirubinemia - Provide a predominantly indirect hyperbilirubinemia, he is with known history of liver cirrhosis, but given his current anemia, unclear if this is related to edema lysis  Liver Cirrhoses/NAFLD: - Seen on abdominal ultrasound-per GI note-known history of nonalcoholic fatty liver disease. Follows with gastroenterology in the outpatient setting - EGD significant for grade 1 esophageal varices, and portal hypertensive gastropathy, but patient with  soft blood pressure, already on low-dose beta blockers  Chronic Diastolic heart failure. - Appears to be euvolemic currently, Appears compensated.EF recently 24-23% grade 2 diastolic dysfunction. BNPwithin the limits of normal. Chest x-ray without pulmonary edema. - Continue Home medications include Toprol, spironolactone, torsemide.   Diabetes type 2.  - Resume home medication on discharge  CAD status post coronary bypass graft 2008. - No chest pain. Recent stress test low risk - Continue home meds  Hypertension.  - Blood pressure acceptable, continue with metoprolol   Thrombocytopenia.  - Likely related to above. Hx of same   Discharge Condition:  Stable   Follow UP  Follow-up Information    McGowen, Adrian Blackwater, MD Follow up.   Specialty:  Family Medicine Contact information: [address] [place] [address] [phone]        Brunetta Genera, MD Follow up.   Specialties:  Hematology, Oncology Why:  Keep your appointment for 6/26 Contact information: [place] [address] [phone]        Juanita Craver, MD Follow up.   Specialty:  Gastroenterology Contact information: [address], [place] [address] [phone]             Discharge Instructions  and  Discharge Medications    Discharge Instructions    Discharge instructions    Complete by:  As directed    Follow with Primary MD McGowen, Adrian Blackwater, MD in 7 days   Get CBC, CMP, checked  by Primary MD next visit.    Activity: As tolerated with Full fall precautions use walker/cane & assistance as needed   Disposition Home    Diet: Heart Healthy , car modified, with feeding assistance and aspiration precautions.  For Heart failure patients - Check your Weight same time everyday, if you gain over 2 pounds, or you develop in leg swelling, experience more shortness of breath or chest pain, call your Primary MD immediately.  Follow Cardiac Low Salt Diet and 1.5 lit/day fluid restriction.   On your next visit with your primary care physician please Get Medicines reviewed and adjusted.   Please request your Prim.MD to go over all Hospital Tests and Procedure/Radiological results at the follow up, please get all Hospital records sent to your Prim MD by signing hospital release before you go home.   If you experience worsening of your admission symptoms, develop shortness of breath, life threatening emergency, suicidal or homicidal thoughts you must seek medical attention immediately by calling 911 or calling your MD immediately  if symptoms less severe.  You Must read complete instructions/literature along with all  the possible adverse reactions/side effects for all the Medicines you take and that have been prescribed to you. Take any new Medicines after you have completely understood and accpet all the possible adverse reactions/side effects.   Do not drive, operating heavy machinery, perform activities at heights, swimming or participation in water activities or provide baby sitting services if your were admitted for syncope or siezures until you have seen by Primary MD or a Neurologist and advised to do so again.  Do not drive when taking Pain medications.    Do not take more than prescribed Pain, Sleep and Anxiety Medications  Special Instructions: If you have smoked or chewed Tobacco  in the last 2 yrs please stop smoking, stop any regular Alcohol  and or any Recreational drug use.  Wear Seat belts while driving.   Please note  You were cared for by a hospitalist during your hospital stay. If you have any questions about your discharge medications or the care you received while you were in the hospital after you are discharged, you can call the unit and asked to speak with the hospitalist on call if the hospitalist that took care of you is not available. Once you are discharged, your primary care physician  will handle any further medical issues. Please note that NO REFILLS for any discharge medications will be authorized once you are discharged, as it is imperative that you return to your primary care physician (or establish a relationship with a primary care physician if you do not have one) for your aftercare needs so that they can reassess your need for medications and monitor your lab values.   Increase activity slowly    Complete by:  As directed      Allergies as of 08/01/2016      Reactions   Statins Other (See Comments)   Liver enzymes go up whenever on them      Medication List    TAKE these medications   fluticasone 50 MCG/ACT nasal spray Commonly known as:  FLONASE Place 2 sprays into both nostrils daily. What changed:  when to take this  reasons to take this   Garlic 3329 MG Caps Take 1 capsule by mouth 2 (two) times daily.   iron polysaccharides 150 MG capsule Commonly known as:  NIFEREX Take 1 capsule (150 mg total) by mouth daily.   metFORMIN 500 MG tablet Commonly known as:  GLUCOPHAGE Take 1 tablet (500 mg total) by mouth 2 (two) times daily with a meal. What changed:  when to take this   metoprolol tartrate 25 MG tablet Commonly known as:  LOPRESSOR Take 0.5 tablets (12.5 mg total) by mouth 2 (two) times daily.   omega-3 acid ethyl esters 1 g capsule Commonly known as:  LOVAZA TAKE 2 CAPSULES BY MOUTH ONCE DAILY What changed:  See the new instructions.   potassium chloride SA 20 MEQ tablet Commonly known as:  K-DUR,KLOR-CON Take 1 tablet (20 mEq total) by mouth 2 (two) times daily.   spironolactone 25 MG tablet Commonly known as:  ALDACTONE Take 1 tablet (25 mg total) by mouth daily.   torsemide 20 MG tablet Commonly known as:  DEMADEX Take 2 tablets (40 mg total) by mouth daily.         Diet and Activity recommendation: See Discharge Instructions above   Consults obtained -  Small bowel enteroscopy   Major procedures and Radiology  Reports - PLEASE review detailed and final reports for all details, in brief -  Dg Chest 2 View  Result Date: 07/29/2016 CLINICAL DATA:  Shortness of breath, weakness EXAM: CHEST  2 VIEW COMPARISON:  05/23/2016 FINDINGS: Prior CABG. Heart is normal size. Mild interstitial prominence and areas of scarring in the lungs bilaterally. No acute airspace opacities or effusions. No acute bony abnormality. IMPRESSION: Interstitial prominence and areas of scarring.  No active disease. Electronically Signed   By: Rolm Baptise M.D.   On: 07/29/2016 11:16   US Abdomen Complete  Result Date: 07/29/2016 CLINICAL DATA:  Anemia for 1 day.  History of thrombocytopenia. EXAM: ABDOMEN ULTRASOUND COMPLETE COMPARISON:  Ultrasound, 05/24/2016 FINDINGS: Gallbladder: Multiple gallstones, mobile and dependent. Largest measures 1 cm. No wall thickening or pericholecystic fluid. Common bile duct: Diameter: 4.3 mm Liver: Coarsened echogenicity with no discrete mass or focal lesion. Nodular contour. Hepatopetal flow documented in the portal vein. IVC: No abnormality visualized. Pancreas: Visualized portion unremarkable. Spleen: Enlarged measuring 12.6 x 15.4 x 8.9 cm, volume calculated at 902 mL. Spleen measures larger than on the prior study where the volume was calculated at 494 mL. No discrete splenic mass or focal lesion. Right Kidney: Length: 12.3 cm. Normal parenchymal echogenicity. Lateral midpole cyst measuring 3.8 x 3.1 x 3.0 cm. This is stable. No other right renal masses, no stones and no hydronephrosis. Left Kidney: Length: 12.1 cm. Normal parenchymal echogenicity. 2.8 cm midpole cyst. 1.4 cm lower pole cyst. No other masses, no stones and no hydronephrosis. Abdominal aorta: Dilated to 3.0 x 3.1 cm. Atherosclerotic calcifications noted along the wall. This aneurysm was not evident on the prior ultrasound, presumably due to bowel gas obscuring the lower abdominal aorta. Other findings: None. IMPRESSION: 1. No acute  findings. 2. Splenomegaly. The spleen has increased in size the prior study increasing from 494 mL to 902 mL. 3. Liver appearance suggests cirrhosis similar to the prior exam. No liver mass or focal lesion. Hepatopetal flow in the portal vein. 4. Renal cysts. 5. 3.1 cm abdominal aortic aneurysm. Recommend followup by ultrasound in 3 years. This recommendation follows ACR consensus guidelines: White Paper of the ACR Incidental Findings Committee II on Vascular Findings. Natasha [facility] Coll Radiol 2013; 10:789-794 Electronically Signed   By: Lajean Manes M.D.   On: 07/29/2016 20:35    Micro Results   No results found for this or any previous visit (from the past 240 hour(s)).     Today   Subjective:   William [name] today has no headache,no chest or abdominal pain,no new weakness tingling or numbness, feels much better wants to go home today.   Objective:   Blood pressure (!) 107/57, pulse 75, temperature 97.4 F (36.3 C), temperature source Oral, resp. rate 20, height 5' 8.5" (1.74 m), weight 110.7 kg (244 lb), SpO2 93 %.   Intake/Output Summary (Last 24 hours) at 08/01/16 1215 Last data filed at 08/01/16 3536  Gross per 24 hour  Intake             1040 ml  Output             1700 ml  Net             -660 ml    Exam Awake Alert, Oriented x 3, No new F.N deficits, Normal affect Supple Neck,No JVD, Symmetrical Chest wall movement, Good air movement bilaterally, CTAB RRR,No Gallops,Rubs or new Murmurs, No Parasternal Heave +ve B.Sounds, Abd Soft, Non tender,No rebound -guarding or rigidity. No Cyanosis, Clubbing or edema, No new Rash or bruise  Data Review  CBC w Diff: Lab Results  Component Value Date   WBC 5.8 08/01/2016   HGB 9.6 (L) 08/01/2016   HGB 7.1 (L) 07/28/2016   HCT 30.4 (L) 08/01/2016   HCT 21.7 (L) 07/28/2016   PLT 104 (L) 08/01/2016   PLT 125 (L) 07/28/2016   LYMPHOPCT 12.3 05/31/2016   MONOPCT 14.7 (H) 05/31/2016   EOSPCT 8.1 (H) 05/31/2016   BASOPCT 1.0  05/31/2016    CMP: Lab Results  Component Value Date   NA 135 08/01/2016   NA 137 07/28/2016   K 3.6 08/01/2016   CL 102 08/01/2016   CO2 25 08/01/2016   BUN 9 08/01/2016   BUN 13 07/28/2016   CREATININE 1.07 08/01/2016   CREATININE 1.06 05/04/2016   PROT 6.1 (L) 08/01/2016   ALBUMIN 2.6 (L) 08/01/2016   BILITOT 6.3 (H) 08/01/2016   ALKPHOS 75 08/01/2016   AST 38 08/01/2016   ALT 27 08/01/2016  .   Total Time in preparing paper work, data evaluation and todays exam - 35 minutes  Perline Awe M.D on 08/01/2016 at 12:15 PM  [facility]   Office  [phone]

## 2016-08-02 ENCOUNTER — Encounter: Payer: Self-pay | Admitting: Hematology

## 2016-08-02 ENCOUNTER — Ambulatory Visit (HOSPITAL_BASED_OUTPATIENT_CLINIC_OR_DEPARTMENT_OTHER): Payer: 59 | Admitting: Hematology

## 2016-08-02 ENCOUNTER — Telehealth: Payer: Self-pay | Admitting: Family Medicine

## 2016-08-02 ENCOUNTER — Telehealth: Payer: Self-pay | Admitting: Hematology

## 2016-08-02 ENCOUNTER — Ambulatory Visit (HOSPITAL_BASED_OUTPATIENT_CLINIC_OR_DEPARTMENT_OTHER): Payer: 59

## 2016-08-02 VITALS — BP 131/67 | HR 83 | Temp 98.9°F | Resp 18 | Ht 68.5 in | Wt 240.0 lb

## 2016-08-02 DIAGNOSIS — D500 Iron deficiency anemia secondary to blood loss (chronic): Secondary | ICD-10-CM | POA: Diagnosis not present

## 2016-08-02 DIAGNOSIS — K7581 Nonalcoholic steatohepatitis (NASH): Secondary | ICD-10-CM | POA: Diagnosis not present

## 2016-08-02 DIAGNOSIS — Z87891 Personal history of nicotine dependence: Secondary | ICD-10-CM

## 2016-08-02 DIAGNOSIS — Z806 Family history of leukemia: Secondary | ICD-10-CM

## 2016-08-02 DIAGNOSIS — I714 Abdominal aortic aneurysm, without rupture: Secondary | ICD-10-CM

## 2016-08-02 DIAGNOSIS — K766 Portal hypertension: Secondary | ICD-10-CM

## 2016-08-02 DIAGNOSIS — E119 Type 2 diabetes mellitus without complications: Secondary | ICD-10-CM

## 2016-08-02 DIAGNOSIS — D62 Acute posthemorrhagic anemia: Secondary | ICD-10-CM

## 2016-08-02 DIAGNOSIS — Z800 Family history of malignant neoplasm of digestive organs: Secondary | ICD-10-CM | POA: Diagnosis not present

## 2016-08-02 DIAGNOSIS — E669 Obesity, unspecified: Secondary | ICD-10-CM | POA: Diagnosis not present

## 2016-08-02 DIAGNOSIS — Z801 Family history of malignant neoplasm of trachea, bronchus and lung: Secondary | ICD-10-CM | POA: Diagnosis not present

## 2016-08-02 DIAGNOSIS — D649 Anemia, unspecified: Secondary | ICD-10-CM

## 2016-08-02 DIAGNOSIS — Z808 Family history of malignant neoplasm of other organs or systems: Secondary | ICD-10-CM

## 2016-08-02 LAB — CBC & DIFF AND RETIC
BASO%: 1 % (ref 0.0–2.0)
Basophils Absolute: 0.1 10*3/uL (ref 0.0–0.1)
EOS ABS: 0.4 10*3/uL (ref 0.0–0.5)
EOS%: 5.9 % (ref 0.0–7.0)
HCT: 34.3 % — ABNORMAL LOW (ref 38.4–49.9)
HEMOGLOBIN: 11.1 g/dL — AB (ref 13.0–17.1)
Immature Retic Fract: 13.2 % — ABNORMAL HIGH (ref 3.00–10.60)
LYMPH%: 17.5 % (ref 14.0–49.0)
MCH: 28.1 pg (ref 27.2–33.4)
MCHC: 32.4 g/dL (ref 32.0–36.0)
MCV: 86.8 fL (ref 79.3–98.0)
MONO#: 1.6 10*3/uL — AB (ref 0.1–0.9)
MONO%: 24.1 % — AB (ref 0.0–14.0)
NEUT%: 51.5 % (ref 39.0–75.0)
NEUTROS ABS: 3.5 10*3/uL (ref 1.5–6.5)
Platelets: 125 10*3/uL — ABNORMAL LOW (ref 140–400)
RBC: 3.95 10*6/uL — AB (ref 4.20–5.82)
RDW: 16.5 % — AB (ref 11.0–14.6)
RETIC %: 4.03 % — AB (ref 0.80–1.80)
Retic Ct Abs: 159.19 10*3/uL — ABNORMAL HIGH (ref 34.80–93.90)
WBC: 6.8 10*3/uL (ref 4.0–10.3)
lymph#: 1.2 10*3/uL (ref 0.9–3.3)

## 2016-08-02 LAB — COMPREHENSIVE METABOLIC PANEL
ALT: 34 U/L (ref 0–55)
AST: 46 U/L — AB (ref 5–34)
Albumin: 3.2 g/dL — ABNORMAL LOW (ref 3.5–5.0)
Alkaline Phosphatase: 107 U/L (ref 40–150)
Anion Gap: 11 mEq/L (ref 3–11)
BUN: 10.4 mg/dL (ref 7.0–26.0)
CHLORIDE: 101 meq/L (ref 98–109)
CO2: 26 meq/L (ref 22–29)
Calcium: 9.5 mg/dL (ref 8.4–10.4)
Creatinine: 1.2 mg/dL (ref 0.7–1.3)
EGFR: 65 mL/min/{1.73_m2} — ABNORMAL LOW (ref >90)
GLUCOSE: 81 mg/dL (ref 70–140)
POTASSIUM: 3.7 meq/L (ref 3.5–5.1)
SODIUM: 138 meq/L (ref 136–145)
Total Bilirubin: 6.03 mg/dL (ref 0.20–1.20)
Total Protein: 7.6 g/dL (ref 6.4–8.3)

## 2016-08-02 LAB — CHCC SMEAR

## 2016-08-02 LAB — LACTATE DEHYDROGENASE: LDH: 220 U/L (ref 125–245)

## 2016-08-02 NOTE — Telephone Encounter (Signed)
Labs added for today, per 08/02/16 los. Lab and follow up with Dr Irene Limbo, scheduled per 08/02/16 los. Patient was given a copy of the AVS report and appointment schedule, per 08/02/16 los.

## 2016-08-02 NOTE — Progress Notes (Signed)
HEMATOLOGY/ONCOLOGY CONSULTATION NOTE  Date of Service: 08/02/2016  Patient Care Team: Tammi Sou, MD as PCP - General (Family Medicine) Juanita Craver, MD as Consulting Physician (Gastroenterology) Franchot Gallo, MD as Consulting Physician (Urology) Troy Sine, MD as Consulting Physician (Cardiology) Serena Colonel, RN as [place] Management  CHIEF COMPLAINTS/PURPOSE OF CONSULTATION:  Anemia  HISTORY OF PRESENTING ILLNESS:   William [name] is a wonderful 66 y.o. male who has been referred to [place] by Dr .Anitra Lauth, Adrian Blackwater, MD for evaluation and management of Anemia.  Patient has a history of extensive medical comorbidities including liver cirrhosis related to [facility] with portal hypertension, esophageal varices and portal hypertensive gastropathy and splenomegaly, iron deficiency anemia, obesity, diabetes, AAA.  Patient was admitted in April 2018 with acute blood loss anemia and required transfusion of multiple units of PRBCs with iron profile suggestive of iron deficiency. No overt evidence of hemolysis noted. He had an EGD and colonoscopy that did not show any overt bleeding. Capsule endoscopy was unrevealing but the capsule past and only 27 minutes.  He follows with Dr. [name] who is his gastric oncologist.  Patient was again readmitted in June 2018 with symptomatically anemia and hemoglobin of 6.3. He underwent 4 units of PRBC transfusions again and was sent out on PPI and iron supplementation with the plan to follow-up with Dr. [name].  He was also given hematology referral. He had a repeat ultrasound of the abdomen on 07/29/2016 which showed significant increase in splenomegaly in 2 months suggesting significant portal hypertension versus some element of splenic sequestration.  He continues to note intermittent black stools.  Hemoglobin is stable and improved today and is up to 11.1. Patient notes he feels better after his transfusions. Has  not noted lower GI bleeding at this time. We discuss any other workup to rule out less likely other possibilities of his anemia.  MEDICAL HISTORY:  Past Medical History:  Diagnosis Date  . AAA (abdominal aortic aneurysm) ([place]) 03/2014   3.2 cm on MR abd.  F/u aortic u/s 06/2014 showed 3.0 x 3.1 cm AAA: recheck 2 yrs recommended (followed by cardiologist)  . Aortic aneurysm ([facility])    Stable per pt and wife  . Bilateral renal cysts    simple (03/2014 MRI)  . CAD (coronary artery disease)   . Chronic diastolic heart failure ([place])   . Cirrhosis ([place]) 05/2016   secondary to NASH  . Diabetes mellitus with complication ([place]) 32/1224   A1c 6.8%  . Hyperlipemia, mixed    elevated LFTs when on statins.    . Hypertension    Cr bump 04/01/16 so I changed benicar-hct to benicar plain and added amlodipine 5 mg.  . Iron deficiency anemia 05/2016   Acute blood loss anemia: hospitalized, required transfusion.  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  W/u for ? hemolytic anemia to be pursued by hematologist as outpt.  . Microscopic hematuria    Eval unremarkable by Dr. Eulogio Ditch.  [name] NASH (nonalcoholic steatohepatitis)    Fatty liver on MR abd 03/2014, + hx of elevated transaminases and bili: followed by Dr. [name].  . Obesity   . Spleen enlarged     SURGICAL HISTORY: Past Surgical History:  Procedure Laterality Date  . CARDIAC CATHETERIZATION    . CARDIOVASCULAR STRESS TEST  01/17/2012; 05/04/16   Normal stress nuclear study x 2 (2018--Normal perfusion. LVEF  66% with normal wall motion. This is a low risk study).  . CERVICAL DISCECTOMY  1992  . COLONOSCOPY N/A 05/27/2016   No site/explanation for blood loss found.  Erythematous mucosa in cecum and ascending colon--Cecal bx: normal.  Procedure: COLONOSCOPY;  Surgeon: Carol Ada, MD;  Location: [facility] ENDOSCOPY;  Service: Endoscopy;  Laterality: N/A;  . COLONOSCOPY  W/ POLYPECTOMY  09/10/2014   Polypectomy x 2: recall 5 yrs (Dr. [name]).  . CORONARY ANGIOPLASTY    . CORONARY ARTERY BYPASS GRAFT  03/2006  . ENTEROSCOPY N/A 07/31/2016   Procedure: ENTEROSCOPY;  Surgeon: Ladene Artist, MD;  Location: [facility] ENDOSCOPY;  Service: Endoscopy;  Laterality: N/A;  . ESOPHAGOGASTRODUODENOSCOPY N/A 05/25/2016   No site/explanation for blood loss found.  Procedure: ESOPHAGOGASTRODUODENOSCOPY (EGD);  Surgeon: Juanita Craver, MD;  Location: [facility] ENDOSCOPY;  Service: Endoscopy;  Laterality: N/A;  . GIVENS CAPSULE STUDY N/A 05/27/2016   Procedure: GIVENS CAPSULE STUDY;  Surgeon: Carol Ada, MD;  Location: [place];  Service: Endoscopy;  Laterality: N/A;  . NECK SURGERY  1991  . TRANSTHORACIC ECHOCARDIOGRAM  05/25/2016   EF 60%, normal wall motion, grd II DD, mild aortic stenosis, dilated aortic root and ascending aorta.  [name] VASECTOMY  1977    SOCIAL HISTORY: Social History   Social History  . Marital status: Married    Spouse name: N/A  . Number of children: N/A  . Years of education: N/A   Occupational History  . Not on file.   Social History Main Topics  . Smoking status: Former Smoker    Types: Cigarettes  . Smokeless tobacco: Never Used     Comment: QUIT I 1983  . Alcohol use No  . Drug use: No  . Sexual activity: Yes   Other Topics Concern  . Not on file   Social History Narrative   Married, 3 grown children, 3 GCs.   Educ: 10th grade.   Occupation: Retired Brewing technologist.   Tob: quit 1983, smoked about 20 pack-yr hx prior.   No alcohol.    FAMILY HISTORY: Family History  Problem Relation Age of Onset  . Hypertension Mother   . Cancer - Other Mother        liver cancer  . Heart disease Father   . Heart attack Father   . Cancer - Lung Father   . Diabetes Father   . Liver disease Sister   . Anemia Sister   . Heart disease Sister   . Heart attack Sister   . Heart disease Brother        CABG 20 YEARS AGO  . Hypertension Brother   .  Mesothelioma Brother        HALF-BROTHER  . Cancer - Other Brother        CLL  . Cancer - Lung Brother        Mets to brain  . Cancer - Lung Sister        HALF-SISTER  . Liver disease Brother   . Heart disease Brother        CABG-2012  . Emphysema Maternal Grandfather   . Cancer - Other Paternal Grandmother        Stomach  . Heart attack Paternal Grandfather     ALLERGIES:  is allergic to statins.  MEDICATIONS:  Current Outpatient Prescriptions  Medication Sig Dispense Refill  . fluticasone (FLONASE) 50 MCG/ACT nasal spray Place 2 sprays into both nostrils daily. (Patient taking differently: Place 2 sprays into both nostrils daily as  needed for allergies. ) 16 g 6  . Garlic 7939 MG CAPS Take 1 capsule by mouth 2 (two) times daily.    . iron polysaccharides (NIFEREX) 150 MG capsule Take 1 capsule (150 mg total) by mouth daily. 30 capsule 6  . metFORMIN (GLUCOPHAGE) 500 MG tablet Take 1 tablet (500 mg total) by mouth 2 (two) times daily with a meal. (Patient taking differently: Take 500 mg by mouth daily with breakfast. ) 60 tablet 6  . metoprolol tartrate (LOPRESSOR) 25 MG tablet Take 0.5 tablets (12.5 mg total) by mouth 2 (two) times daily. 60 tablet 6  . omega-3 acid ethyl esters (LOVAZA) 1 g capsule TAKE 2 CAPSULES BY MOUTH ONCE DAILY (Patient taking differently: TAKE 1 CAPSULES BY MOUTH TWICE DAILY) 180 capsule 3  . potassium chloride SA (K-DUR,KLOR-CON) 20 MEQ tablet Take 1 tablet (20 mEq total) by mouth 2 (two) times daily. 60 tablet 6  . spironolactone (ALDACTONE) 25 MG tablet Take 1 tablet (25 mg total) by mouth daily. 30 tablet 6  . torsemide (DEMADEX) 20 MG tablet Take 2 tablets (40 mg total) by mouth daily. 60 tablet 6   No current facility-administered medications for this visit.     REVIEW OF SYSTEMS:    10 Point review of Systems was done is negative except as noted above.  PHYSICAL EXAMINATION: ECOG PERFORMANCE STATUS: 1 - Symptomatic but completely  ambulatory  . Vitals:   08/02/16 1423  BP: 131/67  Pulse: 83  Resp: 18  Temp: 98.9 F (37.2 C)   Filed Weights   08/02/16 1423  Weight: 240 lb (108.9 kg)   .Body mass index is 35.96 kg/m.  GENERAL:alert, in no acute distress and comfortable SKIN: no acute rashes, no significant lesions EYES: conjunctiva are pink and non-injected, sclera anicteric OROPHARYNX: MMM, no exudates, no oropharyngeal erythema or ulceration NECK: supple, no JVD LYMPH:  no palpable lymphadenopathy in the cervical, axillary or inguinal regions LUNGS: clear to auscultation b/l with normal respiratory effort HEART: regular rate & rhythm ABDOMEN:  normoactive bowel sounds , non tender, not distended. Extremity: no pedal edema PSYCH: alert & oriented x 3 with fluent speech NEURO: no focal motor/sensory deficits  LABORATORY DATA:  I have reviewed the data as listed  . CBC Latest Ref Rng & Units 08/01/2016 07/31/2016 07/31/2016  WBC 4.0 - 10.5 K/uL 5.8 6.6 5.1  Hemoglobin 13.0 - 17.0 g/dL 9.6(L) 9.6(L) 9.5(L)  Hematocrit 39.0 - 52.0 % 30.4(L) 29.9(L) 29.2(L)  Platelets 150 - 400 K/uL 104(L) 97(L) 96(L)    . CMP Latest Ref Rng & Units 08/01/2016 07/31/2016 07/30/2016  Glucose 65 - 99 mg/dL 107(H) 108(H) 114(H)  BUN 6 - 20 mg/dL _0 Creatinine 0.61 - 1.24 mg/dL 1.07 1.17 1.22  Sodium 135 - 145 mmol/L 135 138 137  Potassium 3.5 - 5.1 mmol/L 3.6 3.7 3.6  Chloride 101 - 111 mmol/L 102 103 104  CO2 22 - 32 mmol/L _1 Calcium 8.9 - 10.3 mg/dL 8.6(L) 8.8(L) 8.4(L)  Total Protein 6.5 - 8.1 g/dL 6.1(L) 6.3(L) 5.9(L)  Total Bilirubin 0.3 - 1.2 mg/dL 6.3(H) 6.7(H) 6.1(H)  Alkaline Phos 38 - 126 U/L 75 78 73  AST 15 - 41 U/L 38 44(H) 41  ALT 17 - 63 U/L _2 Component     Latest Ref Rng & Units 08/02/2016 08/02/2016         3:55 PM  3:55 PM  WBC     4.0 -  10.3 10e3/uL 6.8   NEUT#     1.5 - 6.5 10e3/uL 3.5   Hemoglobin     13.0 - 17.1 g/dL 11.1 (L)   HCT     38.4 - 49.9 % 34.3 (L) 33.4  (L)  Platelets     140 - 400 10e3/uL 125 (L)   MCV     79.3 - 98.0 fL 86.8   MCH     27.2 - 33.4 pg 28.1   MCHC     32.0 - 36.0 g/dL 32.4   RBC     4.20 - 5.82 10e6/uL 3.95 (L)   RDW     11.0 - 14.6 % 16.5 (H)   lymph#     0.9 - 3.3 10e3/uL 1.2   MONO#     0.1 - 0.9 10e3/uL 1.6 (H)   Eosinophils Absolute     0.0 - 0.5 10e3/uL 0.4   Basophils Absolute     0.0 - 0.1 10e3/uL 0.1   NEUT%     39.0 - 75.0 % 51.5   LYMPH%     14.0 - 49.0 % 17.5   MONO%     0.0 - 14.0 % 24.1 (H)   EOS%     0.0 - 7.0 % 5.9   BASO%     0.0 - 2.0 % 1.0   Retic %     0.80 - 1.80 % 4.03 (H)   Retic Ct Abs     34.80 - 93.90 10e3/uL 159.19 (H)   Immature Retic Fract     3.00 - 10.60 % 13.20 (H)   Sodium     136 - 145 mEq/L 138   Potassium     3.5 - 5.1 mEq/L 3.7   Chloride     98 - 109 mEq/L 101   CO2     22 - 29 mEq/L 26   Glucose     70 - 140 mg/dl 81   BUN     7.0 - 26.0 mg/dL 10.4   Creatinine     0.7 - 1.3 mg/dL 1.2   Total Bilirubin     0.20 - 1.20 mg/dL 6.03 (HH)   Alkaline Phosphatase     40 - 150 U/L 107   AST     5 - 34 U/L 46 (H)   ALT     0 - 55 U/L 34   Total Protein     6.4 - 8.3 g/dL 7.6 7.4  Albumin     3.5 - 5.0 g/dL 3.2 (L)   Calcium     8.4 - 10.4 mg/dL 9.5   Anion gap     3 - 11 mEq/L 11   EGFR     >90 ml/min/1.73 m2 65 (L)   IgG (Immunoglobin G), Serum     700 - 1600 mg/dL  1,689 (H)  IgA/Immunoglobulin A, Serum     61 - 437 mg/dL  732 (H)  IgM, Qn, Serum     20 - 172 mg/dL  88  Albumin SerPl Elph-Mcnc     2.9 - 4.4 g/dL  3.3  Alpha 1     0.0 - 0.4 g/dL  0.3  Alpha2 Glob SerPl Elph-Mcnc     0.4 - 1.0 g/dL  0.5  B-Globulin SerPl Elph-Mcnc     0.7 - 1.3 g/dL  1.4 (H)  Gamma Glob SerPl Elph-Mcnc     0.4 - 1.8 g/dL  2.0 (H)  M Protein SerPl Elph-Mcnc  Not Observed g/dL  Not Observed  Globulin, Total     2.2 - 3.9 g/dL  4.1 (H)  Albumin/Glob SerPl     0.7 - 1.7  0.9  IFE 1       Comment  Please Note (HCV):       Comment   Interpretation         Comment:         Specimen:         Submitted Dx:         Viability:         Cell Population         Granulocytes:         Monocytes:         Antibodies Performed:         Director Review         Iron     42 - 163 ug/dL 36 (L)   TIBC     202 - 409 ug/dL 518 (H)   UIBC     117 - 376 ug/dL 481 (H)   %SAT     20 - 55 % 7 (L)   Folate, Hemolysate     Not Estab. ng/mL  506.3  Folate, RBC     >498 ng/mL  1,516  Ig Kappa Free Light Chain     3.3 - 19.4 mg/L 62.4 (H)   Ig Lambda Free Light Chain     5.7 - 26.3 mg/L 63.6 (H)   Kappa/Lambda FluidC Ratio     0.26 - 1.65 0.98   Ferritin     22 - 316 ng/ml 30   Vitamin B12     232 - 1245 pg/mL 1,240   Sed Rate     0 - 30 mm/hr 17   LDH     125 - 245 U/L 220   Coombs', Direct     Negative Negative     RADIOGRAPHIC STUDIES: I have personally reviewed the radiological images as listed and agreed with the findings in the report. Dg Chest 2 View  Result Date: 07/29/2016 CLINICAL DATA:  Shortness of breath, weakness EXAM: CHEST  2 VIEW COMPARISON:  05/23/2016 FINDINGS: Prior CABG. Heart is normal size. Mild interstitial prominence and areas of scarring in the lungs bilaterally. No acute airspace opacities or effusions. No acute bony abnormality. IMPRESSION: Interstitial prominence and areas of scarring.  No active disease. Electronically Signed   By: Rolm Baptise M.D.   On: 07/29/2016 11:16   US Abdomen Complete  Result Date: 07/29/2016 CLINICAL DATA:  Anemia for 1 day.  History of thrombocytopenia. EXAM: ABDOMEN ULTRASOUND COMPLETE COMPARISON:  Ultrasound, 05/24/2016 FINDINGS: Gallbladder: Multiple gallstones, mobile and dependent. Largest measures 1 cm. No wall thickening or pericholecystic fluid. Common bile duct: Diameter: 4.3 mm Liver: Coarsened echogenicity with no discrete mass or focal lesion. Nodular contour. Hepatopetal flow documented in the portal vein. IVC: No abnormality visualized.  Pancreas: Visualized portion unremarkable. Spleen: Enlarged measuring 12.6 x 15.4 x 8.9 cm, volume calculated at 902 mL. Spleen measures larger than on the prior study where the volume was calculated at 494 mL. No discrete splenic mass or focal lesion. Right Kidney: Length: 12.3 cm. Normal parenchymal echogenicity. Lateral midpole cyst measuring 3.8 x 3.1 x 3.0 cm. This is stable. No other right renal masses, no stones and no hydronephrosis. Left Kidney: Length: 12.1 cm. Normal parenchymal echogenicity. 2.8 cm midpole cyst. 1.4 cm lower pole cyst. No other masses, no stones and no hydronephrosis.  Abdominal aorta: Dilated to 3.0 x 3.1 cm. Atherosclerotic calcifications noted along the wall. This aneurysm was not evident on the prior ultrasound, presumably due to bowel gas obscuring the lower abdominal aorta. Other findings: None. IMPRESSION: 1. No acute findings. 2. Splenomegaly. The spleen has increased in size the prior study increasing from 494 mL to 902 mL. 3. Liver appearance suggests cirrhosis similar to the prior exam. No liver mass or focal lesion. Hepatopetal flow in the portal vein. 4. Renal cysts. 5. 3.1 cm abdominal aortic aneurysm. Recommend followup by ultrasound in 3 years. This recommendation follows ACR consensus guidelines: White Paper of the ACR Incidental Findings Committee II on Vascular Findings. Natasha Mead Coll Radiol 2013; 10:789-794 Electronically Signed   By: Lajean Manes M.D.   On: 07/29/2016 20:35    ASSESSMENT & PLAN:   66 year old male with multiple medical comorbidities with Karlene Lineman with liver cirrhosis with portal hypertension, esophageal varices and portal hypertensive gastropathy with  #1 Acute and Chronic blood loss anemia.  Patient has had recurrent GI bleeding requiring 9 units of PRBCs. Between April 2018 and June 2018. Currently his hemoglobin is improved and stable at 11.1.  LDH within normal limits at 220 and suggests against overt hemolysis. Sedimentation rate within  normal limits. Coombs' test is negative. Myeloma panel and serum free light chains suggest no monoclonal paraproteinemia. PNH testing negative Slightly lower haptoglobin levels can be related to decreased hepatic production, low-level hemolysis due to transfusions or extravascular hemolysis due to splenic or hepatic sequestration.  Some element of anemia could also be from his liver disease. Doubt significance Spurr cell anemia given that his baseline hemoglobin levels are 11. When he is not bleeding.  Plan -Patient is on oral iron as per his gastroenterologist. -Would recommend he have close follow-up with Dr. [name] to address portal hypertension and other sources of GI bleeding including consideration of repeat capsule endoscopy since the previous one was inadequate. -We will see him back in a few weeks to reassess iron levels to determine need for IV iron. -Reasonable to take a daily multivitamin. -Patient has been counseled to go to the emergency room if he has acute bleeding or develops significant symptomatic anemia acutely. -We will try a strategy of maintaining high ferritin levels more than 100 to see if this can keep him out of the hospital though with acute bleeding this might not be possible. -He has been off aspirin since late since April 2018. -Would recommend holding off any blood thinners unless absolutely indicated.  Labs today RTC with Dr Irene Limbo in 3 weeks with labs    All of the patients questions were answered with apparent satisfaction. The patient knows to call the clinic with any problems, questions or concerns.  I spent 40 minutes counseling the patient face to face. The total time spent in the appointment was 60 minutes and more than 50% was on counseling and direct patient cares.    Sullivan Lone MD [place] AAHIVMS [facility] Hematology/Oncology Physician [facility]  (Office):       [phone] (Work cell):  [phone] (Fax):            [phone]  08/02/2016 2:51 PM

## 2016-08-02 NOTE — Telephone Encounter (Signed)
LMOM for pt to CB to discuss recent hospital follow up-TCM.

## 2016-08-03 LAB — KAPPA/LAMBDA LIGHT CHAINS
IG KAPPA FREE LIGHT CHAIN: 62.4 mg/L — AB (ref 3.3–19.4)
Ig Lambda Free Light Chain: 63.6 mg/L — ABNORMAL HIGH (ref 5.7–26.3)
KAPPA/LAMBDA FLC RATIO: 0.98 (ref 0.26–1.65)

## 2016-08-03 LAB — FOLATE RBC
Folate, Hemolysate: 506.3 ng/mL
Folate, RBC: 1516 ng/mL (ref >498)
Hematocrit: 33.4 % — ABNORMAL LOW (ref 37.5–51.0)

## 2016-08-03 LAB — FERRITIN: FERRITIN: 30 ng/mL (ref 22–316)

## 2016-08-03 LAB — IRON AND TIBC
%SAT: 7 % — AB (ref 20–55)
IRON: 36 ug/dL — AB (ref 42–163)
TIBC: 518 ug/dL — ABNORMAL HIGH (ref 202–409)
UIBC: 481 ug/dL — AB (ref 117–376)

## 2016-08-03 LAB — HAPTOGLOBIN

## 2016-08-03 LAB — SEDIMENTATION RATE: Sedimentation Rate-Westergren: 17 mm/hr (ref 0–30)

## 2016-08-03 LAB — DIRECT ANTIGLOBULIN TEST (NOT AT ARMC): COOMBS', DIRECT: NEGATIVE

## 2016-08-03 LAB — VITAMIN B12: Vitamin B12: 1240 pg/mL (ref 232–1245)

## 2016-08-03 NOTE — Telephone Encounter (Signed)
Spoke to patient's wife, William [name].  She states he had repeat blood work yesterday at Hematology.   Transition Care Management Follow-up Telephone Call   Date discharged? 6.25.18   How have you been since you were released from the hospital? Feeling better   Do you understand why you were in the hospital? yes   Do you understand the discharge instructions? yes   Where were you discharged to? Home   Items Reviewed:  Medications reviewed: yes  Allergies reviewed: yes  Dietary changes reviewed: no  Referrals reviewed: no   Functional Questionnaire:   Activities of Daily Living (ADLs):   He states they are independent in the following: none States they require assistance with the following: none   Any transportation issues/concerns?: no   Any patient concerns? no   Confirmed importance and date/time of follow-up visits scheduled yes Provider Appointment booked with Dr Anitra Lauth, 7.9.18 @ 3:15pm.   Confirmed with patient if condition begins to worsen call PCP or go to the ER.  Patient was given the office number and encouraged to call back with question or concerns.  : yes

## 2016-08-03 NOTE — Telephone Encounter (Signed)
Noted: phone contact for TCM done today, 08/03/16.

## 2016-08-04 LAB — MULTIPLE MYELOMA PANEL, SERUM
ALBUMIN SERPL ELPH-MCNC: 3.3 g/dL (ref 2.9–4.4)
Albumin/Glob SerPl: 0.9 (ref 0.7–1.7)
Alpha 1: 0.3 g/dL (ref 0.0–0.4)
Alpha2 Glob SerPl Elph-Mcnc: 0.5 g/dL (ref 0.4–1.0)
B-GLOBULIN SERPL ELPH-MCNC: 1.4 g/dL — AB (ref 0.7–1.3)
Gamma Glob SerPl Elph-Mcnc: 2 g/dL — ABNORMAL HIGH (ref 0.4–1.8)
Globulin, Total: 4.1 g/dL — ABNORMAL HIGH (ref 2.2–3.9)
IgA, Qn, Serum: 732 mg/dL — ABNORMAL HIGH (ref 61–437)
IgG, Qn, Serum: 1689 mg/dL — ABNORMAL HIGH (ref 700–1600)
IgM, Qn, Serum: 88 mg/dL (ref 20–172)
TOTAL PROTEIN: 7.4 g/dL (ref 6.0–8.5)

## 2016-08-05 LAB — PNH PROFILE (-HIGH SENSITIVITY)

## 2016-08-12 ENCOUNTER — Other Ambulatory Visit: Payer: Self-pay | Admitting: *Deleted

## 2016-08-12 NOTE — Patient Outreach (Addendum)
[facility]) Care Management  08/12/2016  William [name] Jan 18, 1951 [id number]   Subjective: Telephone call to patient's home number, no answer, left HIPAA compliant voicemail message, and requested call back.   Objective:Per chart review, patient hospitalized 07/29/16 - 08/01/16,  and 05/22/16 - 05/29/16 for gastrointestinal bleed. Patient also has a history of Acute blood loss anemia, Hyperbilirubinemia, Liver Cirrhoses/NAFLD (nonalcoholic fatty liver disease), Acute on chronic diastolic heart failure, CAD, hypertension, diabetes, mixed hyperlipidemia, and status post CABG in 2008.    Assessment:Received [facility]MR Transition of care referral on 08/11/16.  Transition of care follow up pending patient contact.    Plan: RNCM will call patient for 2nd telephone outreach attempt, transition of care follow up, within 10 business days if no return call.     Fermin Yan H. Annia Friendly, BSN, [place] Management [facility] Telephonic CM Phone: [phone] Fax: [phone]

## 2016-08-15 ENCOUNTER — Encounter: Payer: Self-pay | Admitting: Family Medicine

## 2016-08-15 ENCOUNTER — Other Ambulatory Visit: Payer: Self-pay | Admitting: *Deleted

## 2016-08-15 ENCOUNTER — Ambulatory Visit (INDEPENDENT_AMBULATORY_CARE_PROVIDER_SITE_OTHER): Payer: 59 | Admitting: Family Medicine

## 2016-08-15 ENCOUNTER — Encounter: Payer: Self-pay | Admitting: *Deleted

## 2016-08-15 VITALS — BP 122/67 | HR 79 | Temp 98.3°F | Resp 16 | Ht 68.5 in | Wt 245.2 lb

## 2016-08-15 DIAGNOSIS — K7460 Unspecified cirrhosis of liver: Secondary | ICD-10-CM

## 2016-08-15 DIAGNOSIS — D649 Anemia, unspecified: Secondary | ICD-10-CM

## 2016-08-15 DIAGNOSIS — D500 Iron deficiency anemia secondary to blood loss (chronic): Secondary | ICD-10-CM | POA: Diagnosis not present

## 2016-08-15 DIAGNOSIS — I5032 Chronic diastolic (congestive) heart failure: Secondary | ICD-10-CM | POA: Diagnosis not present

## 2016-08-15 DIAGNOSIS — E119 Type 2 diabetes mellitus without complications: Secondary | ICD-10-CM

## 2016-08-15 DIAGNOSIS — K766 Portal hypertension: Secondary | ICD-10-CM

## 2016-08-15 NOTE — Patient Outreach (Signed)
[facility]) Care Management  08/15/2016  SAHEJ SCHRIEBER 09-04-1950 [id number]   Subjective: Telephone call to patient's home number, spoke with patient, and HIPAA verified.  Discussed [facility] Care Management UMR Transition of care follow up, patient voiced understanding, and is in agreement to follow up.   Patient states he is doing much better, has hospital follow up appointment with primary MD today, cardiology follow up, on 08/30/16 and 09/12/16.  He also gave verbal consent for RNCM to speak with wife regarding healthcare needs as needed.  States he will advised wife to call this RNCM if she has an additional questions.  States he is accessing the following [facility] benefits: outpatient pharmacy, hospital indemnity (per previous conversation with wife patient, does have this benefit, wife will follow up to file claims if appropriate), and he does not need familly medical leave act (FMLA) at this time due to being retired.  Patient states he does not have any education material, transition of care, care coordination, disease management, disease monitoring, transportation, community resource, or pharmacy needs at this time.  States he is very appreciative of the follow up and is in agreement to receive [place] Management information.     Objective:Per chart review, patient hospitalized 07/29/16 - 08/01/16,  and 05/22/16 - 05/29/16 for gastrointestinal bleed. Patient also has a history of Acute blood loss anemia, Hyperbilirubinemia, Liver Cirrhoses/NAFLD (nonalcoholic fatty liver disease), Acute on chronic diastolic heart failure, CAD, hypertension, diabetes, mixed hyperlipidemia, and status post CABG in 2008.    Assessment:Received UMR Transition of care referral on 08/11/16.  Transition of care follow up completed, no care management needs, and will proceed with case closure.     Plan: RNCM will send patient successful outreach letter, [facility] pamphlet, and magnet. RNCM will send  case closure due to follow up completed / no care management needs request to [facility].    Jameia Makris H. Annia Friendly, BSN, [place] Management [facility] Telephonic CM Phone: [phone] Fax: [phone]

## 2016-08-15 NOTE — Progress Notes (Signed)
08/15/2016  CC:  Chief Complaint  Patient presents with  . Hospitalization Follow-up    TCM    Patient is a 66 y.o. Caucasian male who presents for  hospital follow up, specifically [facility] face-to-face visit. Dates hospitalized: 6/22-6/25, 2018. Days since d/c from hospital: 14 Patient was discharged from hospital to home. Reason for admission to hospital: symptomatic anemia.   Date of interactive (phone) contact with patient and/or caregiver: 08/03/16  I have reviewed patient's discharge summary plus pertinent specific notes, labs, and imaging from the hospitalization.   Work up revealed portal gastropathy but no sign of active bleeding.  There was some suspicion of possible hemolytic anemia so a hematology eval was set up as an outpatient (Dr. Irene Limbo, 08/02/16). He received 4 U pRBCs.  Discharge Hb was 9.6 and he was d/c'd home on iron. Feeling much better.  No SOB/DOE.  Energy level still down in afternoons.  Still having some dark stools but no melena. Suspect this is from taking iron. Home bps avg 110-120 over 50s to 60s, avg HR 80.  Glucoses: 100-120 fasting.  He stopped the metformin at last hospitalization and the hematologist kept him off of this due to his liver dysfunction. Wts staying stable around 241 lbs +/- 2 lbs.  Medication reconciliation was done today and patient is taking meds as recommended by discharging hospitalist/specialist.    PMH:  Past Medical History:  Diagnosis Date  . AAA (abdominal aortic aneurysm) ([place]) 03/2014   3.2 cm on MR abd.  F/u aortic u/s 06/2014 showed 3.0 x 3.1 cm AAA: recheck 2 yrs recommended (followed by cardiologist)  . Bilateral renal cysts    simple (03/2014 MRI)  . CAD (coronary artery disease)   . Cholelithiases 2018   asymptomatic  . Chronic diastolic heart failure ([place])   . Cirrhosis ([place]) 05/2016   secondary to NASH; most recent u/s abd 07/2016 showed splenomegaly.  Hx of portal HTN changes with mild ascites and  splenomegaly.  . Diabetes mellitus with complication ([place]) 84/6659   A1c 6.8%  . Hyperlipemia, mixed    elevated LFTs when on statins.    . Hypertension    Cr bump 04/01/16 so I changed benicar-hct to benicar plain and added amlodipine 5 mg.  . Iron deficiency anemia 05/2016   Acute blood loss anemia: hospitalized, required transfusion.  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  W/u for ? hemolytic anemia to be pursued by hematologist as outpt.  . Microscopic hematuria    Eval unremarkable by Dr. Eulogio Ditch.  [name] NASH (nonalcoholic steatohepatitis)    Fatty liver on MR abd 03/2014, + hx of elevated transaminases and bili: followed by Dr. [name].  . Obesity   . Spleen enlarged     PSH:  Past Surgical History:  Procedure Laterality Date  . CARDIAC CATHETERIZATION    . CARDIOVASCULAR STRESS TEST  01/17/2012; 05/04/16   Normal stress nuclear study x 2 (2018--Normal perfusion. LVEF 66% with normal wall motion. This is a low risk study).  . CERVICAL DISCECTOMY  1992  . COLONOSCOPY N/A 05/27/2016   No site/explanation for blood loss found.  Erythematous mucosa in cecum and ascending colon--Cecal bx: normal.  Procedure: COLONOSCOPY;  Surgeon: Carol Ada, MD;  Location: [facility] ENDOSCOPY;  Service: Endoscopy;  Laterality: N/A;  . COLONOSCOPY W/ POLYPECTOMY  09/10/2014   Polypectomy x 2: recall 5 yrs (Dr. [name]).  . CORONARY ANGIOPLASTY    .  CORONARY ARTERY BYPASS GRAFT  03/2006  . ENTEROSCOPY N/A 07/31/2016   Procedure: ENTEROSCOPY;  Surgeon: Ladene Artist, MD;  Location: [facility] ENDOSCOPY;  Service: Endoscopy;  Laterality: N/A;  . ESOPHAGOGASTRODUODENOSCOPY N/A 05/25/2016   No site/explanation for blood loss found.  Procedure: ESOPHAGOGASTRODUODENOSCOPY (EGD);  Surgeon: Juanita Craver, MD;  Location: [facility] ENDOSCOPY;  Service: Endoscopy;  Laterality: N/A;  . GIVENS CAPSULE STUDY N/A 05/27/2016   Procedure: GIVENS CAPSULE  STUDY;  Surgeon: Carol Ada, MD;  Location: [facility];  Service: Endoscopy;  Laterality: N/A;  . NECK SURGERY  1991  . TRANSTHORACIC ECHOCARDIOGRAM  05/25/2016   EF 60%, normal wall motion, grd II DD, mild aortic stenosis, dilated aortic root and ascending aorta.  [name] VASECTOMY  1977    MEDS:  Outpatient Medications Prior to Visit  Medication Sig Dispense Refill  . fluticasone (FLONASE) 50 MCG/ACT nasal spray Place 2 sprays into both nostrils daily. (Patient taking differently: Place 2 sprays into both nostrils daily as needed for allergies. ) 16 g 6  . Garlic 7703 MG CAPS Take 1 capsule by mouth 2 (two) times daily.    . iron polysaccharides (NIFEREX) 150 MG capsule Take 1 capsule (150 mg total) by mouth daily. 30 capsule 6  . metoprolol tartrate (LOPRESSOR) 25 MG tablet Take 0.5 tablets (12.5 mg total) by mouth 2 (two) times daily. 60 tablet 6  . omega-3 acid ethyl esters (LOVAZA) 1 g capsule TAKE 2 CAPSULES BY MOUTH ONCE DAILY (Patient taking differently: TAKE 1 CAPSULES BY MOUTH TWICE DAILY) 180 capsule 3  . potassium chloride SA (K-DUR,KLOR-CON) 20 MEQ tablet Take 1 tablet (20 mEq total) by mouth 2 (two) times daily. 60 tablet 6  . spironolactone (ALDACTONE) 25 MG tablet Take 1 tablet (25 mg total) by mouth daily. 30 tablet 6  . torsemide (DEMADEX) 20 MG tablet Take 2 tablets (40 mg total) by mouth daily. 60 tablet 6  . metFORMIN (GLUCOPHAGE) 500 MG tablet Take 1 tablet (500 mg total) by mouth 2 (two) times daily with a meal. (Patient not taking: Reported on 08/15/2016) 60 tablet 6   No facility-administered medications prior to visit.    EXAM: BP 122/67 (BP Location: Left Arm, Patient Position: Sitting, Cuff Size: Normal)   Pulse 79   Temp 98.3 F (36.8 C) (Oral)   Resp 16   Ht 5' 8.5" (1.74 m)   Wt 245 lb 4 oz (111.2 kg)   SpO2 97%   BMI 36.75 kg/m  Gen: Alert, well appearing.  Patient is oriented to person, place, time, and situation. EKB:TCYE: no injection, icteris,  swelling, or exudate.  EOMI, PERRLA. Mouth: lips without lesion/swelling.  Oral mucosa pink and moist. Oropharynx without erythema, exudate, or swelling.  CV: RRR, 3/6 systolic murmur, no diastolic murmur, no r/g.   LUNGS: CTA bilat, nonlabored resps, good aeration in all lung fields. ABD: soft, NT/ND EXT: no clubbing or cyanosis.  Trace bilat LE edema.  Pertinent labs/imaging Lab Results  Component Value Date   WBC 6.8 08/02/2016   HGB 11.1 (L) 08/02/2016   HCT 34.3 (L) 08/02/2016   HCT 33.4 (L) 08/02/2016   MCV 86.8 08/02/2016   PLT 125 (L) 08/02/2016     Chemistry      Component Value Date/Time   NA 138 08/02/2016 1555   K 3.7 08/02/2016 1555   CL 102 08/01/2016 0636   CO2 26 08/02/2016 1555   BUN 10.4 08/02/2016 1555   CREATININE 1.2 08/02/2016 1555  Component Value Date/Time   CALCIUM 9.5 08/02/2016 1555   ALKPHOS 107 08/02/2016 1555   AST 46 (H) 08/02/2016 1555   ALT 34 08/02/2016 1555   BILITOT 6.03 (HH) 08/02/2016 1555     Lab Results  Component Value Date   HGBA1C 5.4 06/29/2016   ASSESSMENT/PLAN:  1) Anemia, acute on chronic, symptomatic: doing much better s/p transfusion in hospital. Work up being done currently by hematology to further eval for possible hemolytic anemia--has f/u with Dr. Irene Limbo 08/30/16. Hx of small AVM on relatively recent capsule endoscopy.  Colonoscopy unremarkable.  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   We'll recheck CBC today to keep an eye on this. Continue iron qd. 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 Continue current meds.   Has f/u with GI MD 09/08/16  3) DM 2, last A1c excellent 06/29/16. He has been off metformin since this hospitalization and we'll keep him off this med due to his hepatic dysfunction. REcheck A1c at next f/u in late august this year.  4) Chronic diastolic HF: asymptomatic.  Normal fluid balance at this time.  Good bp. Has f/u with  cardiologist, Dr. Claiborne Billings, on 09/12/16.  Medical decision making of moderate complexity was utilized today.  FOLLOW UP:  Keep f/u already set with me on 09/29/16: we'll recheck labs, including A1c, at that time.  Signed:  Crissie Sickles, MD           08/15/2016

## 2016-08-16 LAB — COMPREHENSIVE METABOLIC PANEL
ALT: 25 U/L (ref 0–53)
AST: 35 U/L (ref 0–37)
Albumin: 3.2 g/dL — ABNORMAL LOW (ref 3.5–5.2)
Alkaline Phosphatase: 81 U/L (ref 39–117)
BILIRUBIN TOTAL: 3.9 mg/dL — AB (ref 0.2–1.2)
BUN: 13 mg/dL (ref 6–23)
CHLORIDE: 101 meq/L (ref 96–112)
CO2: 26 meq/L (ref 19–32)
CREATININE: 1.04 mg/dL (ref 0.40–1.50)
Calcium: 8.8 mg/dL (ref 8.4–10.5)
GFR: 75.73 mL/min (ref >60.00)
GLUCOSE: 119 mg/dL — AB (ref 70–99)
Potassium: 4.2 mEq/L (ref 3.5–5.1)
Sodium: 135 mEq/L (ref 135–145)
Total Protein: 6.6 g/dL (ref 6.0–8.3)

## 2016-08-16 LAB — CBC
HCT: 26.2 % — ABNORMAL LOW (ref 39.0–52.0)
Hemoglobin: 8.7 g/dL — ABNORMAL LOW (ref 13.0–17.0)
MCHC: 33.1 g/dL (ref 30.0–36.0)
MCV: 86.1 fl (ref 78.0–100.0)
Platelets: 161 10*3/uL (ref 150.0–400.0)
RBC: 3.05 Mil/uL — ABNORMAL LOW (ref 4.22–5.81)
RDW: 17.2 % — AB (ref 11.5–15.5)
WBC: 6.1 10*3/uL (ref 4.0–10.5)

## 2016-08-17 ENCOUNTER — Encounter: Payer: Self-pay | Admitting: *Deleted

## 2016-08-17 ENCOUNTER — Other Ambulatory Visit: Payer: Self-pay | Admitting: *Deleted

## 2016-08-17 DIAGNOSIS — D500 Iron deficiency anemia secondary to blood loss (chronic): Secondary | ICD-10-CM

## 2016-08-18 ENCOUNTER — Other Ambulatory Visit (INDEPENDENT_AMBULATORY_CARE_PROVIDER_SITE_OTHER): Payer: 59

## 2016-08-18 DIAGNOSIS — D500 Iron deficiency anemia secondary to blood loss (chronic): Secondary | ICD-10-CM

## 2016-08-18 LAB — CBC
HCT: 25.7 % — ABNORMAL LOW (ref 39.0–52.0)
Hemoglobin: 8.5 g/dL — ABNORMAL LOW (ref 13.0–17.0)
MCHC: 33.1 g/dL (ref 30.0–36.0)
MCV: 86.3 fl (ref 78.0–100.0)
PLATELETS: 160 10*3/uL (ref 150.0–400.0)
RBC: 2.97 Mil/uL — AB (ref 4.22–5.81)
RDW: 16.7 % — ABNORMAL HIGH (ref 11.5–15.5)
WBC: 6.1 10*3/uL (ref 4.0–10.5)

## 2016-08-19 ENCOUNTER — Encounter: Payer: Self-pay | Admitting: *Deleted

## 2016-08-23 MED FILL — METOPROLOL TARTRATE 25 MG T: 25 | 60 days supply | Qty: 60 | Fill #1

## 2016-08-23 MED FILL — SPIRONOLACTONE 25 MG TABLET: 25 | 30 days supply | Qty: 30 | Fill #2

## 2016-08-30 ENCOUNTER — Encounter: Payer: Self-pay | Admitting: Hematology

## 2016-08-30 ENCOUNTER — Inpatient Hospital Stay (HOSPITAL_COMMUNITY)
Admission: EM | Admit: 2016-08-30 | Discharge: 2016-09-03 | DRG: 378 | Disposition: A | Discharge status: Home / Self Care | Payer: 59 | Attending: Family Medicine | Admitting: Family Medicine

## 2016-08-30 ENCOUNTER — Encounter (HOSPITAL_COMMUNITY): Payer: Self-pay

## 2016-08-30 ENCOUNTER — Other Ambulatory Visit (HOSPITAL_BASED_OUTPATIENT_CLINIC_OR_DEPARTMENT_OTHER): Payer: 59

## 2016-08-30 ENCOUNTER — Ambulatory Visit (HOSPITAL_BASED_OUTPATIENT_CLINIC_OR_DEPARTMENT_OTHER): Payer: 59 | Admitting: Hematology

## 2016-08-30 VITALS — BP 109/58 | HR 79 | Temp 98.7°F | Resp 20 | Ht 68.5 in | Wt 250.3 lb

## 2016-08-30 DIAGNOSIS — I10 Essential (primary) hypertension: Secondary | ICD-10-CM | POA: Diagnosis not present

## 2016-08-30 DIAGNOSIS — R7989 Other specified abnormal findings of blood chemistry: Secondary | ICD-10-CM | POA: Diagnosis present

## 2016-08-30 DIAGNOSIS — Z6836 Body mass index (BMI) 36.0-36.9, adult: Secondary | ICD-10-CM

## 2016-08-30 DIAGNOSIS — D62 Acute posthemorrhagic anemia: Secondary | ICD-10-CM

## 2016-08-30 DIAGNOSIS — D696 Thrombocytopenia, unspecified: Secondary | ICD-10-CM | POA: Diagnosis present

## 2016-08-30 DIAGNOSIS — D649 Anemia, unspecified: Secondary | ICD-10-CM | POA: Diagnosis not present

## 2016-08-30 DIAGNOSIS — I5032 Chronic diastolic (congestive) heart failure: Secondary | ICD-10-CM | POA: Diagnosis present

## 2016-08-30 DIAGNOSIS — D500 Iron deficiency anemia secondary to blood loss (chronic): Secondary | ICD-10-CM

## 2016-08-30 DIAGNOSIS — I8500 Esophageal varices without bleeding: Secondary | ICD-10-CM

## 2016-08-30 DIAGNOSIS — R945 Abnormal results of liver function studies: Secondary | ICD-10-CM

## 2016-08-30 DIAGNOSIS — K7460 Unspecified cirrhosis of liver: Secondary | ICD-10-CM | POA: Diagnosis not present

## 2016-08-30 DIAGNOSIS — I2510 Atherosclerotic heart disease of native coronary artery without angina pectoris: Secondary | ICD-10-CM | POA: Diagnosis present

## 2016-08-30 DIAGNOSIS — Z79899 Other long term (current) drug therapy: Secondary | ICD-10-CM

## 2016-08-30 DIAGNOSIS — K921 Melena: Secondary | ICD-10-CM

## 2016-08-30 DIAGNOSIS — K766 Portal hypertension: Secondary | ICD-10-CM

## 2016-08-30 DIAGNOSIS — R161 Splenomegaly, not elsewhere classified: Secondary | ICD-10-CM

## 2016-08-30 DIAGNOSIS — K5900 Constipation, unspecified: Secondary | ICD-10-CM | POA: Diagnosis present

## 2016-08-30 DIAGNOSIS — Z9861 Coronary angioplasty status: Secondary | ICD-10-CM

## 2016-08-30 DIAGNOSIS — K7581 Nonalcoholic steatohepatitis (NASH): Secondary | ICD-10-CM

## 2016-08-30 DIAGNOSIS — R195 Other fecal abnormalities: Secondary | ICD-10-CM | POA: Diagnosis present

## 2016-08-30 DIAGNOSIS — Z87891 Personal history of nicotine dependence: Secondary | ICD-10-CM

## 2016-08-30 DIAGNOSIS — I714 Abdominal aortic aneurysm, without rupture: Secondary | ICD-10-CM | POA: Diagnosis present

## 2016-08-30 DIAGNOSIS — E119 Type 2 diabetes mellitus without complications: Secondary | ICD-10-CM | POA: Diagnosis not present

## 2016-08-30 DIAGNOSIS — K922 Gastrointestinal hemorrhage, unspecified: Secondary | ICD-10-CM | POA: Diagnosis not present

## 2016-08-30 DIAGNOSIS — E782 Mixed hyperlipidemia: Secondary | ICD-10-CM | POA: Diagnosis present

## 2016-08-30 DIAGNOSIS — I110 Hypertensive heart disease with heart failure: Secondary | ICD-10-CM | POA: Diagnosis not present

## 2016-08-30 DIAGNOSIS — Z794 Long term (current) use of insulin: Secondary | ICD-10-CM

## 2016-08-30 DIAGNOSIS — E669 Obesity, unspecified: Secondary | ICD-10-CM | POA: Diagnosis present

## 2016-08-30 DIAGNOSIS — I5033 Acute on chronic diastolic (congestive) heart failure: Secondary | ICD-10-CM | POA: Diagnosis present

## 2016-08-30 LAB — CBC & DIFF AND RETIC
BASO%: 0.1 % (ref 0.0–2.0)
BASOS ABS: 0 10*3/uL (ref 0.0–0.1)
EOS%: 8.3 % — AB (ref 0.0–7.0)
Eosinophils Absolute: 0.6 10*3/uL — ABNORMAL HIGH (ref 0.0–0.5)
HEMATOCRIT: 21.1 % — AB (ref 38.4–49.9)
HEMOGLOBIN: 6.7 g/dL — AB (ref 13.0–17.1)
Immature Retic Fract: 20.1 % — ABNORMAL HIGH (ref 3.00–10.60)
LYMPH#: 1.3 10*3/uL (ref 0.9–3.3)
LYMPH%: 17.8 % (ref 14.0–49.0)
MCH: 26.9 pg — ABNORMAL LOW (ref 27.2–33.4)
MCHC: 31.9 g/dL — AB (ref 32.0–36.0)
MCV: 84.5 fL (ref 79.3–98.0)
MONO#: 1.3 10*3/uL — ABNORMAL HIGH (ref 0.1–0.9)
MONO%: 18 % — ABNORMAL HIGH (ref 0.0–14.0)
NEUT#: 4 10*3/uL (ref 1.5–6.5)
NEUT%: 55.8 % (ref 39.0–75.0)
PLATELETS: 133 10*3/uL — AB (ref 140–400)
RBC: 2.5 10*6/uL — ABNORMAL LOW (ref 4.20–5.82)
RDW: 16.4 % — AB (ref 11.0–14.6)
RETIC %: 5.71 % — AB (ref 0.80–1.80)
Retic Ct Abs: 142.75 10*3/uL — ABNORMAL HIGH (ref 34.80–93.90)
WBC: 7.2 10*3/uL (ref 4.0–10.3)

## 2016-08-30 LAB — COMPREHENSIVE METABOLIC PANEL
ALBUMIN: 2.6 g/dL — AB (ref 3.5–5.0)
ALK PHOS: 77 U/L (ref 38–126)
ALT: 28 U/L (ref 17–63)
AST: 41 U/L (ref 15–41)
Anion gap: 9 (ref 5–15)
BILIRUBIN TOTAL: 3.7 mg/dL — AB (ref 0.3–1.2)
BUN: 15 mg/dL (ref 6–20)
CALCIUM: 8.6 mg/dL — AB (ref 8.9–10.3)
CO2: 23 mmol/L (ref 22–32)
CREATININE: 1.02 mg/dL (ref 0.61–1.24)
Chloride: 102 mmol/L (ref 101–111)
GFR calc non Af Amer: >60 mL/min (ref >60)
GLUCOSE: 119 mg/dL — AB (ref 65–99)
Potassium: 4 mmol/L (ref 3.5–5.1)
SODIUM: 134 mmol/L — AB (ref 135–145)
TOTAL PROTEIN: 6.1 g/dL — AB (ref 6.5–8.1)

## 2016-08-30 LAB — CBC
HCT: 18.9 % — ABNORMAL LOW (ref 39.0–52.0)
HEMOGLOBIN: 6.1 g/dL — AB (ref 13.0–17.0)
MCH: 27.4 pg (ref 26.0–34.0)
MCHC: 32.3 g/dL (ref 30.0–36.0)
MCV: 84.8 fL (ref 78.0–100.0)
Platelets: 108 10*3/uL — ABNORMAL LOW (ref 150–400)
RBC: 2.23 MIL/uL — AB (ref 4.22–5.81)
RDW: 16.2 % — ABNORMAL HIGH (ref 11.5–15.5)
WBC: 6.3 10*3/uL (ref 4.0–10.5)

## 2016-08-30 LAB — POC OCCULT BLOOD, ED: Fecal Occult Bld: POSITIVE — AB

## 2016-08-30 LAB — PREPARE RBC (CROSSMATCH)

## 2016-08-30 LAB — GLUCOSE, CAPILLARY
GLUCOSE-CAPILLARY: 119 mg/dL — AB (ref 65–99)
Glucose-Capillary: 105 mg/dL — ABNORMAL HIGH (ref 65–99)

## 2016-08-30 LAB — IRON AND TIBC
IRON: 16 ug/dL — AB (ref 45–182)
SATURATION RATIOS: 3 % — AB (ref 17.9–39.5)
TIBC: 500 ug/dL — AB (ref 250–450)
UIBC: 484 ug/dL

## 2016-08-30 LAB — PROTIME-INR
INR: 1.68
Prothrombin Time: 20 seconds — ABNORMAL HIGH (ref 11.4–15.2)

## 2016-08-30 LAB — FERRITIN: Ferritin: 11 ng/mL — ABNORMAL LOW (ref 24–336)

## 2016-08-30 LAB — LACTATE DEHYDROGENASE: LDH: 155 U/L (ref 98–192)

## 2016-08-30 LAB — ABO/RH: ABO/RH(D): O POS

## 2016-08-30 MED ORDER — SODIUM CHLORIDE 0.9 % IV SOLN
INTRAVENOUS | Status: DC
  Administered 2016-08-30 (×2): via INTRAVENOUS

## 2016-08-30 MED ORDER — SODIUM CHLORIDE 0.9 % IV SOLN
80.0000 mg | Freq: Once | INTRAVENOUS | Status: AC
  Administered 2016-08-30: 80 mg via INTRAVENOUS
  Filled 2016-08-30: qty 80

## 2016-08-30 MED ORDER — ONDANSETRON HCL 4 MG/2ML IJ SOLN: 4.0000 mg | Freq: Four times a day (QID) | INTRAMUSCULAR | Status: DC | PRN

## 2016-08-30 MED ORDER — POTASSIUM CHLORIDE CRYS ER 20 MEQ PO TBCR
20.0000 meq | EXTENDED_RELEASE_TABLET | Freq: Two times a day (BID) | ORAL | Status: DC
  Administered 2016-08-30 – 2016-09-03 (×9): 20 meq via ORAL
  Filled 2016-08-30 (×9): qty 1

## 2016-08-30 MED ORDER — POLYSACCHARIDE IRON COMPLEX 150 MG PO CAPS
150.0000 mg | ORAL_CAPSULE | Freq: Every day | ORAL | Status: DC
  Administered 2016-08-30 – 2016-09-03 (×5): 150 mg via ORAL
  Filled 2016-08-30 (×5): qty 1

## 2016-08-30 MED ORDER — OMEGA-3-ACID ETHYL ESTERS 1 G PO CAPS
2.0000 | ORAL_CAPSULE | Freq: Every day | ORAL | Status: DC
  Administered 2016-08-30 – 2016-09-03 (×5): 2 g via ORAL
  Filled 2016-08-30 (×5): qty 2

## 2016-08-30 MED ORDER — PRO-STAT SUGAR FREE PO LIQD
30.0000 mL | Freq: Two times a day (BID) | ORAL | Status: DC
  Administered 2016-08-30 – 2016-09-03 (×9): 30 mL via ORAL
  Filled 2016-08-30 (×9): qty 30

## 2016-08-30 MED ORDER — FLUTICASONE PROPIONATE 50 MCG/ACT NA SUSP: 2.0000 | Freq: Every day | NASAL | Status: DC | PRN

## 2016-08-30 MED ORDER — ACETAMINOPHEN 650 MG RE SUPP: 650.0000 mg | Freq: Four times a day (QID) | RECTAL | Status: DC | PRN

## 2016-08-30 MED ORDER — SPIRONOLACTONE 25 MG PO TABS
25.0000 mg | ORAL_TABLET | Freq: Every day | ORAL | Status: DC
  Administered 2016-08-30 – 2016-09-03 (×5): 25 mg via ORAL
  Filled 2016-08-30 (×5): qty 1

## 2016-08-30 MED ORDER — TORSEMIDE 20 MG PO TABS
40.0000 mg | ORAL_TABLET | Freq: Every day | ORAL | Status: DC
  Administered 2016-08-30 – 2016-09-03 (×5): 40 mg via ORAL
  Filled 2016-08-30 (×5): qty 2

## 2016-08-30 MED ORDER — ORAL CARE MOUTH RINSE
15.0000 mL | Freq: Two times a day (BID) | OROMUCOSAL | Status: DC
  Administered 2016-08-31 – 2016-09-02 (×6): 15 mL via OROMUCOSAL

## 2016-08-30 MED ORDER — ACETAMINOPHEN 325 MG PO TABS: 650.0000 mg | ORAL_TABLET | Freq: Four times a day (QID) | ORAL | Status: DC | PRN

## 2016-08-30 MED ORDER — SODIUM CHLORIDE 0.9 % IV SOLN
10.0000 mL/h | Freq: Once | INTRAVENOUS | Status: AC
  Administered 2016-08-30: 10 mL/h via INTRAVENOUS

## 2016-08-30 MED ORDER — METOPROLOL TARTRATE 25 MG PO TABS
12.5000 mg | ORAL_TABLET | Freq: Two times a day (BID) | ORAL | Status: DC
  Administered 2016-08-30 – 2016-09-03 (×8): 12.5 mg via ORAL
  Filled 2016-08-30 (×8): qty 1

## 2016-08-30 MED ORDER — INSULIN ASPART 100 UNIT/ML ~~LOC~~ SOLN
0.0000 [IU] | Freq: Three times a day (TID) | SUBCUTANEOUS | Status: DC
Start: 2016-08-31 — End: 2016-09-03
  Administered 2016-08-31 – 2016-09-01 (×2): 2 [IU] via SUBCUTANEOUS
  Administered 2016-09-02: 1 [IU] via SUBCUTANEOUS

## 2016-08-30 MED ORDER — FUROSEMIDE 10 MG/ML IJ SOLN
20.0000 mg | Freq: Once | INTRAMUSCULAR | Status: AC
  Administered 2016-08-30: 20 mg via INTRAVENOUS
  Filled 2016-08-30: qty 2

## 2016-08-30 MED ORDER — SODIUM CHLORIDE 0.9 % IV SOLN
8.0000 mg/h | INTRAVENOUS | Status: AC
  Administered 2016-08-30 – 2016-09-01 (×6): 8 mg/h via INTRAVENOUS
  Filled 2016-08-30 (×15): qty 80

## 2016-08-30 MED ORDER — SODIUM CHLORIDE 0.9 % IV SOLN
INTRAVENOUS | Status: DC
  Administered 2016-08-30: 13:00:00 via INTRAVENOUS

## 2016-08-30 NOTE — Progress Notes (Signed)
Pt experiencing increased fatigue over past few days, especially at night. Low-grade fevers in the evenings. SOB with minimal exertion at night. Hgb today was 6.7. Pt additionally noted to Dr. Irene Limbo that he has had some dark stools. Pt sent to ED to evaluate for GI bleeding and to receive blood transfusions. Pt explained why he is being sent to ED. Pt and wife verbalized understanding. Report given to Loma Sousa, RN in the ED.

## 2016-08-30 NOTE — H&P (Signed)
TRH H&P   Patient Demographics:    William [name], is a 66 y.o. male  MRN: [id number]   DOB - May 20, 1950  Admit Date - 08/30/2016  Outpatient Primary MD for the patient is McGowen, Adrian Blackwater, MD  Referring MD/NP/PA: Alphonzo Lemmings  Outpatient Specialists: Dr. Irene Limbo, Dr. [name] (GI)  Patient coming from:  Office, oncology  Chief Complaint  Patient presents with  . GI Bleeding      HPI:    William [name]  is a 66 y.o. male, w hx of iron deficiency anemia, [place], Cirrhosis, thrombocytopenia, apparently was seen by hematology and found to have low hemoglobin of 6.7, pt notes slight dizziness with standing and slight dyspnea with exertion.  Pt notes intermittent black stool since prior to recent admission June.  Pt does take iron.  Pt denies n/v, abd pain, diarrhea, brbpr.  Sent to ED for evaluation  In ED, Hgb 6.1,  Plt 134,  Pt has received 1 unit prbc and will receive another unit.  Pt will be admitted for symptomatic anemia.      Review of systems:    In addition to the HPI above, No Fever-chills, No Headache, No changes with Vision or hearing, No problems swallowing food or Liquids, No Chest pain, Cough  No Abdominal pain, No Nausea or Vommitting, Bowel movements are regular, No Blood in stool or Urine, No dysuria, No new skin rashes or bruises, No new joints pains-aches,  No new weakness, tingling, numbness in any extremity, No recent weight gain or loss, No polyuria, polydypsia or polyphagia, No significant Mental Stressors.  A full 10 point Review of Systems was done, except as stated above, all other Review of Systems were negative.   With Past History of the following :    Past Medical History:  Diagnosis Date  . AAA (abdominal aortic aneurysm) ([place]) 03/2014   3.2 cm on MR abd.  F/u aortic u/s 06/2014 showed 3.0 x 3.1 cm AAA: recheck 2 yrs recommended (followed by  cardiologist)  . Bilateral renal cysts    simple (03/2014 MRI)  . CAD (coronary artery disease)   . Cholelithiases 2018   asymptomatic  . Chronic diastolic heart failure ([place])   . Cirrhosis ([place]) 05/2016   secondary to NASH; most recent u/s abd 07/2016 showed splenomegaly.  Hx of portal HTN changes with mild ascites and splenomegaly.  . Diabetes mellitus with complication ([place]) 37/2902   A1c 6.8%  . Hyperlipemia, mixed    elevated LFTs when on statins.    . Hypertension    Cr bump 04/01/16 so I changed benicar-hct to benicar plain and added amlodipine 5 mg.  . Iron deficiency anemia 05/2016   Acute blood loss anemia: hospitalized, required transfusion.  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  W/u for ? hemolytic anemia  to be pursued by hematologist as outpt.  . Microscopic hematuria    Eval unremarkable by Dr. Eulogio Ditch.  [name] NASH (nonalcoholic steatohepatitis)    Fatty liver on MR abd 03/2014, + hx of elevated transaminases and bili: followed by Dr. [name].  . Obesity   . Spleen enlarged       Past Surgical History:  Procedure Laterality Date  . CARDIAC CATHETERIZATION    . CARDIOVASCULAR STRESS TEST  01/17/2012; 05/04/16   Normal stress nuclear study x 2 (2018--Normal perfusion. LVEF 66% with normal wall motion. This is a low risk study).  . CERVICAL DISCECTOMY  1992  . COLONOSCOPY N/A 05/27/2016   No site/explanation for blood loss found.  Erythematous mucosa in cecum and ascending colon--Cecal bx: normal.  Procedure: COLONOSCOPY;  Surgeon: Carol Ada, MD;  Location: [facility] ENDOSCOPY;  Service: Endoscopy;  Laterality: N/A;  . COLONOSCOPY W/ POLYPECTOMY  09/10/2014   Polypectomy x 2: recall 5 yrs (Dr. [name]).  . CORONARY ANGIOPLASTY    . CORONARY ARTERY BYPASS GRAFT  03/2006  . ENTEROSCOPY N/A 07/31/2016   Procedure: ENTEROSCOPY;  Surgeon: Ladene Artist, MD;  Location: [facility] ENDOSCOPY;  Service:  Endoscopy;  Laterality: N/A;  . ESOPHAGOGASTRODUODENOSCOPY N/A 05/25/2016   No site/explanation for blood loss found.  Procedure: ESOPHAGOGASTRODUODENOSCOPY (EGD);  Surgeon: Juanita Craver, MD;  Location: [facility] ENDOSCOPY;  Service: Endoscopy;  Laterality: N/A;  . GIVENS CAPSULE STUDY N/A 05/27/2016   Procedure: GIVENS CAPSULE STUDY;  Surgeon: Carol Ada, MD;  Location: [place];  Service: Endoscopy;  Laterality: N/A;  . NECK SURGERY  1991  . TRANSTHORACIC ECHOCARDIOGRAM  05/25/2016   EF 60%, normal wall motion, grd II DD, mild aortic stenosis, dilated aortic root and ascending aorta.  [name] VASECTOMY  1977      Social History:     Social History  Substance Use Topics  . Smoking status: Former Smoker    Types: Cigarettes  . Smokeless tobacco: Never Used     Comment: QUIT I 1983  . Alcohol use No     Lives - at home  Mobility - walks by self    Family History :     Family History  Problem Relation Age of Onset  . Hypertension Mother   . Cancer - Other Mother        liver cancer  . Heart disease Father   . Heart attack Father   . Cancer - Lung Father   . Diabetes Father   . Liver disease Sister   . Anemia Sister   . Heart disease Sister   . Heart attack Sister   . Heart disease Brother        CABG 20 YEARS AGO  . Hypertension Brother   . Mesothelioma Brother        HALF-BROTHER  . Cancer - Other Brother        CLL  . Cancer - Lung Brother        Mets to brain  . Cancer - Lung Sister        HALF-SISTER  . Liver disease Brother   . Heart disease Brother        CABG-2012  . Emphysema Maternal Grandfather   . Cancer - Other Paternal Grandmother        Stomach  . Heart attack Paternal Grandfather       Home Medications:   Prior to Admission medications   Medication Sig Start Date End Date Taking? Authorizing Provider  fluticasone (FLONASE) 50 MCG/ACT  nasal spray Place 2 sprays into both nostrils daily. Patient taking differently: Place 2 sprays into both  nostrils daily as needed for allergies.  01/01/16  Yes With[place], Elyse Jarvis, FNP  iron polysaccharides (NIFEREX) 150 MG capsule Take 1 capsule (150 mg total) by mouth daily. 06/22/16  Yes McGowen, Adrian Blackwater, MD  metoprolol tartrate (LOPRESSOR) 25 MG tablet Take 0.5 tablets (12.5 mg total) by mouth 2 (two) times daily. 06/22/16  Yes McGowen, Adrian Blackwater, MD  omega-3 acid ethyl esters (LOVAZA) 1 g capsule TAKE 2 CAPSULES BY MOUTH ONCE DAILY Patient taking differently: TAKE 1 CAPSULES BY MOUTH TWICE DAILY 03/24/16  Yes Troy Sine, MD  potassium chloride SA (K-DUR,KLOR-CON) 20 MEQ tablet Take 1 tablet (20 mEq total) by mouth 2 (two) times daily. 06/22/16  Yes McGowen, Adrian Blackwater, MD  spironolactone (ALDACTONE) 25 MG tablet Take 1 tablet (25 mg total) by mouth daily. 06/22/16  Yes McGowen, Adrian Blackwater, MD  torsemide (DEMADEX) 20 MG tablet Take 2 tablets (40 mg total) by mouth daily. 06/22/16  Yes McGowen, Adrian Blackwater, MD     Allergies:     Allergies  Allergen Reactions  . Statins Other (See Comments)    Liver enzymes go up whenever on them     Physical Exam:   Vitals  Blood pressure 107/66, pulse 75, temperature (!) 97.4 F (36.3 C), temperature source Oral, resp. rate 16, height 5' 8"  (1.727 m), weight 113.4 kg (250 lb), SpO2 99 %.   1. General  lying in bed in NAD,   2. Normal affect and insight, Not Suicidal or Homicidal, Awake Alert, Oriented X 3.  3. No F.N deficits, ALL C.Nerves Intact, Strength 5/5 all 4 extremities, Sensation intact all 4 extremities, Plantars down going.  4. Ears and Eyes appear Normal, Conjunctivae pale PERRLA. Moist Oral Mucosa.  5. Supple Neck, No JVD, No cervical lymphadenopathy appriciated, No Carotid Bruits.  6. Symmetrical Chest wall movement, Good air movement bilaterally, CTAB.  7. RRR, No Gallops, Rubs or Murmurs, No Parasternal Heave.  8. Positive Bowel Sounds, Abdomen Soft, No tenderness, No organomegaly appriciated,No rebound -guarding or rigidity.  9.  No  Cyanosis, Normal Skin Turgor, No Skin Rash or Bruise.  10. Good muscle tone,  joints appear normal , no effusions, Normal ROM.  11. No Palpable Lymph Nodes in Neck or Axillae     Data Review:    CBC  Recent Labs Lab 08/30/16 0803 08/30/16 1145  WBC 7.2 6.3  HGB 6.7* 6.1*  HCT 21.1* 18.9*  PLT 133* 108*  MCV 84.5 84.8  MCH 26.9* 27.4  MCHC 31.9* 32.3  RDW 16.4* 16.2*  LYMPHSABS 1.3  --   MONOABS 1.3*  --   EOSABS 0.6*  --   BASOSABS 0.0  --    ------------------------------------------------------------------------------------------------------------------  Chemistries   Recent Labs Lab 08/30/16 1145  NA 134*  K 4.0  CL 102  CO2 23  GLUCOSE 119*  BUN 15  CREATININE 1.02  CALCIUM 8.6*  AST 41  ALT 28  ALKPHOS 77  BILITOT 3.7*   ------------------------------------------------------------------------------------------------------------------ estimated creatinine clearance is 87.1 mL/min (by C-G formula based on SCr of 1.02 mg/dL). ------------------------------------------------------------------------------------------------------------------ No results for input(s): TSH, T4TOTAL, T3FREE, THYROIDAB in the last 72 hours.  Invalid input(s): FREET3  Coagulation profile  Recent Labs Lab 08/30/16 1145  INR 1.68   ------------------------------------------------------------------------------------------------------------------- No results for input(s): DDIMER in the last 72 hours. -------------------------------------------------------------------------------------------------------------------  Cardiac Enzymes No results for input(s): CKMB, TROPONINI, MYOGLOBIN in the last  168 hours.  Invalid input(s): CK ------------------------------------------------------------------------------------------------------------------    Component Value Date/Time   BNP 343.4 (H) 05/26/2016 0307   BNP 574.6 (H) 05/04/2016 1012      ---------------------------------------------------------------------------------------------------------------  Urinalysis    Component Value Date/Time   COLORURINE YELLOW 08/01/2016 0542   APPEARANCEUR CLEAR 08/01/2016 0542   LABSPEC 1.010 08/01/2016 0542   PHURINE 5.0 08/01/2016 0542   GLUCOSEU NEGATIVE 08/01/2016 0542   HGBUR NEGATIVE 08/01/2016 0542   BILIRUBINUR NEGATIVE 08/01/2016 0542   KETONESUR NEGATIVE 08/01/2016 0542   PROTEINUR NEGATIVE 08/01/2016 0542   NITRITE NEGATIVE 08/01/2016 0542   LEUKOCYTESUR NEGATIVE 08/01/2016 0542    ----------------------------------------------------------------------------------------------------------------   Imaging Results:    No results found.    Assessment & Plan:    Active Problems:   Elevated LFTs   Non-insulin treated type 2 diabetes mellitus (HCC)   Anemia   Thrombocytopenia (HCC)    Anemia Protonix iv Transfuse with 2 units prbc Check cbc after transfusion NPO  GI consult for evaluation  Thrombocytopenia Repeat cbc in am  Dm2 fsbs q4h , iss  CHF Please be gentle with iv hydration   DVT Prophylaxis SCDs   AM Labs Ordered, also please review Full Orders  Family Communication: Admission, patients condition and plan of care including tests being ordered have been discussed with the patient  who indicate understanding and agree with the plan and Code Status.  Code Status FULL CODE  Likely DC to  home  Condition GUARDED    Consults called:   gastroenterology  Admission status:  observation  Time spent in minutes : 45 minutes   Jani Gravel M.D on 08/30/2016 at 6:35 PM  Between 7am to 7pm - Pager - [phone] After 7pm go to [email or web address] - password [facility]  [facility] - Office  [phone]

## 2016-08-30 NOTE — ED Provider Notes (Signed)
[facility] DEPT Provider Note   CSN: [id number] Arrival date & time: 08/30/16  1700     History   Chief Complaint Chief Complaint  Patient presents with  . GI Bleeding    HPI William [name] is a 66 y.o. male.  Patient indicates recent problem w anemia, gi bleeding, and was sent from cancer center this AM with hemoglobin 6.  States had extensive workup in past 2-3 months for same - no source of bleeding or anemia noted.  Patient in past week c/o generalized weakness, lightheaded when stands, no energy. Symptoms moderate, persistent, slowly worse. No chest pain or sob. No abd pain. No nvd. Stools have been very dark.    The history is provided by the patient.    Past Medical History:  Diagnosis Date  . AAA (abdominal aortic aneurysm) ([place]) 03/2014   3.2 cm on MR abd.  F/u aortic u/s 06/2014 showed 3.0 x 3.1 cm AAA: recheck 2 yrs recommended (followed by cardiologist)  . Bilateral renal cysts    simple (03/2014 MRI)  . CAD (coronary artery disease)   . Cholelithiases 2018   asymptomatic  . Chronic diastolic heart failure ([place])   . 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  Hx of portal HTN changes with mild ascites and splenomegaly.  . Diabetes mellitus with complication ([place]) 17/4944   A1c 6.8%  . Hyperlipemia, mixed    elevated LFTs when on statins.    . Hypertension    Cr bump 04/01/16 so I changed benicar-hct to benicar plain and added amlodipine 5 mg.  . Iron deficiency anemia 05/2016   Acute blood loss anemia: hospitalized, required transfusion.  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  W/u for ? hemolytic anemia to be pursued by hematologist as outpt.  . Microscopic hematuria    Eval unremarkable by Dr. Eulogio Ditch.  [name] NASH (nonalcoholic steatohepatitis)    Fatty liver on MR abd 03/2014, + hx of elevated transaminases and  bili: followed by Dr. [name].  . Obesity   . Spleen enlarged     Patient Active Problem List   Diagnosis Date Noted  . Symptomatic anemia 07/30/2016  . Thrombocytopenia ([place]) 07/29/2016  . Weakness 07/28/2016  . Aortic stenosis, mild 07/28/2016  . Anemia   . Chronic diastolic heart failure ([place])   . History of GI bleed 05/22/2016  . Pulmonary edema 05/22/2016  . Acute on chronic renal failure ([place]) 05/22/2016  . Non-insulin treated type 2 diabetes mellitus ([place]) 05/22/2016  . Elevated LFTs 01/17/2015  . Moderate obesity 11/15/2013  . Abdominal aortic aneurysm ([place]) 03/19/2013  . Hematuria 03/19/2013  . Morbid obesity ([place]) 08/24/2012  . 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 Metabolic syndrome 96/75/9163  . Essential hypertension 08/24/2012  . Mixed hyperlipidemia 08/24/2012    Past Surgical History:  Procedure Laterality Date  . CARDIAC CATHETERIZATION    . CARDIOVASCULAR STRESS TEST  01/17/2012; 05/04/16   Normal stress nuclear study x 2 (2018--Normal perfusion. LVEF 66% with normal wall motion. This is a low risk study).  . CERVICAL DISCECTOMY  1992  . COLONOSCOPY N/A 05/27/2016   No site/explanation for blood loss found.  Erythematous mucosa in cecum and ascending colon--Cecal bx: normal.  Procedure: COLONOSCOPY;  Surgeon: Carol Ada, MD;  Location: [facility] ENDOSCOPY;  Service: Endoscopy;  Laterality: N/A;  . COLONOSCOPY W/ POLYPECTOMY  09/10/2014  Polypectomy x 2: recall 5 yrs (Dr. [name]).  . CORONARY ANGIOPLASTY    . CORONARY ARTERY BYPASS GRAFT  03/2006  . ENTEROSCOPY N/A 07/31/2016   Procedure: ENTEROSCOPY;  Surgeon: Ladene Artist, MD;  Location[facility]COPY;  Service: Endoscopy;  Laterality: N/A;  . ESOPHAGOGASTRODUODENOSCOPY N/A 05/25/2016   No site/explanation for blood loss found.  Procedure: ESOPHAGOGASTRODUODENOSCOPY (EGD);  Surgeon: Juanita Craver, MD;  Location: [facility] ENDOSCOPY;  Service: Endoscopy;  Laterality: N/A;  . GIVENS CAPSULE STUDY N/A 05/27/2016   Procedure: GIVENS  CAPSULE STUDY;  Surgeon: Carol Ada, MD;  Location: [facility];  Service: Endoscopy;  Laterality: N/A;  . NECK SURGERY  1991  . TRANSTHORACIC ECHOCARDIOGRAM  05/25/2016   EF 60%, normal wall motion, grd II DD, mild aortic stenosis, dilated aortic root and ascending aorta.  [name] VASECTOMY  1977       Home Medications    Prior to Admission medications   Medication Sig Start Date End Date Taking? Authorizing Provider  fluticasone (FLONASE) 50 MCG/ACT nasal spray Place 2 sprays into both nostrils daily. Patient taking differently: Place 2 sprays into both nostrils daily as needed for allergies.  01/01/16   Withrow, Elyse Jarvis, FNP  Garlic 9628 MG CAPS Take 1 capsule by mouth 2 (two) times daily.    [provider]  iron polysaccharides (NIFEREX) 150 MG capsule Take 1 capsule (150 mg total) by mouth daily. 06/22/16   McGowen, Adrian Blackwater, MD  metoprolol tartrate (LOPRESSOR) 25 MG tablet Take 0.5 tablets (12.5 mg total) by mouth 2 (two) times daily. 06/22/16   McGowen, Adrian Blackwater, MD  omega-3 acid ethyl esters (LOVAZA) 1 g capsule TAKE 2 CAPSULES BY MOUTH ONCE DAILY Patient taking differently: TAKE 1 CAPSULES BY MOUTH TWICE DAILY 03/24/16   Troy Sine, MD  potassium chloride SA (K-DUR,KLOR-CON) 20 MEQ tablet Take 1 tablet (20 mEq total) by mouth 2 (two) times daily. 06/22/16   McGowen, Adrian Blackwater, MD  spironolactone (ALDACTONE) 25 MG tablet Take 1 tablet (25 mg total) by mouth daily. 06/22/16   McGowen, Adrian Blackwater, MD  torsemide (DEMADEX) 20 MG tablet Take 2 tablets (40 mg total) by mouth daily. 06/22/16   McGowen, Adrian Blackwater, MD    Family History Family History  Problem Relation Age of Onset  . Hypertension Mother   . Cancer - Other Mother        liver cancer  . Heart disease Father   . Heart attack Father   . Cancer - Lung Father   . Diabetes Father   . Liver disease Sister   . Anemia Sister   . Heart disease Sister   . Heart attack Sister   . Heart disease Brother        CABG 20 YEARS  AGO  . Hypertension Brother   . Mesothelioma Brother        HALF-BROTHER  . Cancer - Other Brother        CLL  . Cancer - Lung Brother        Mets to brain  . Cancer - Lung Sister        HALF-SISTER  . Liver disease Brother   . Heart disease Brother        CABG-2012  . Emphysema Maternal Grandfather   . Cancer - Other Paternal Grandmother        Stomach  . Heart attack Paternal Grandfather     Social History Social History  Substance Use Topics  . Smoking status: Former  Smoker    Types: Cigarettes  . Smokeless tobacco: Never Used     Comment: QUIT I 1983  . Alcohol use No     Allergies   Statins   Review of Systems Review of Systems  Constitutional: Negative for fever.  HENT: Negative for sore throat.   Eyes: Negative for redness.  Respiratory: Negative for shortness of breath.   Cardiovascular: Negative for chest pain.  Gastrointestinal: Negative for abdominal pain and vomiting.  Endocrine: Negative for polyuria.  Genitourinary: Negative for flank pain.  Musculoskeletal: Negative for back pain and neck pain.  Skin: Negative for rash.  Neurological: Positive for light-headedness. Negative for headaches.  Hematological: Does not bruise/bleed easily.  Psychiatric/Behavioral: Negative for confusion.     Physical Exam Updated Vital Signs BP 122/64 (BP Location: Left Arm)   Pulse 71   Temp 98.1 F (36.7 C) (Oral)   Resp 18   SpO2 97%   Physical Exam  Constitutional: He is oriented to person, place, and time. He appears well-developed and well-nourished. No distress.  HENT:  Mouth/Throat: Oropharynx is clear and moist.  Eyes: Conjunctivae are normal.  Neck: Neck supple. No tracheal deviation present.  Cardiovascular: Normal rate, regular rhythm, normal heart sounds and intact distal pulses.  Exam reveals no gallop and no friction rub.   No murmur heard. Pulmonary/Chest: Effort normal and breath sounds normal. No accessory muscle usage. No respiratory  distress.  Abdominal: Soft. Bowel sounds are normal. He exhibits no distension and no mass. There is no tenderness. There is no rebound and no guarding. No hernia.  Genitourinary:  Genitourinary Comments: No cva tenderness. Dark brown stool.   Musculoskeletal: He exhibits no edema.  Neurological: He is alert and oriented to person, place, and time.  Skin: Skin is warm and dry. He is not diaphoretic.  Psychiatric: He has a normal mood and affect.  Nursing note and vitals reviewed.    ED Treatments / Results  Labs (all labs ordered are listed, but only abnormal results are displayed)  Results for orders placed or performed during the hospital encounter of 08/30/16  CBC  Result Value Ref Range   WBC 6.3 4.0 - 10.5 K/uL   RBC 2.23 (L) 4.22 - 5.81 MIL/uL   Hemoglobin 6.1 (LL) 13.0 - 17.0 g/dL   HCT 18.9 (L) 39.0 - 52.0 %   MCV 84.8 78.0 - 100.0 fL   MCH 27.4 26.0 - 34.0 pg   MCHC 32.3 30.0 - 36.0 g/dL   RDW 16.2 (H) 11.5 - 15.5 %   Platelets 108 (L) 150 - 400 K/uL  Comprehensive metabolic panel  Result Value Ref Range   Sodium 134 (L) 135 - 145 mmol/L   Potassium 4.0 3.5 - 5.1 mmol/L   Chloride 102 101 - 111 mmol/L   CO2 23 22 - 32 mmol/L   Glucose, Bld 119 (H) 65 - 99 mg/dL   BUN 15 6 - 20 mg/dL   Creatinine, Ser 1.02 0.61 - 1.24 mg/dL   Calcium 8.6 (L) 8.9 - 10.3 mg/dL   Total Protein 6.1 (L) 6.5 - 8.1 g/dL   Albumin 2.6 (L) 3.5 - 5.0 g/dL   AST 41 15 - 41 U/L   ALT 28 17 - 63 U/L   Alkaline Phosphatase 77 38 - 126 U/L   Total Bilirubin 3.7 (H) 0.3 - 1.2 mg/dL   GFR calc non Af Amer >60 >60 mL/min   GFR calc Af Amer >60 >60 mL/min   Anion gap 9  5 - 15  Lactate dehydrogenase  Result Value Ref Range   LDH 155 98 - 192 U/L  Protime-INR  Result Value Ref Range   Prothrombin Time 20.0 (H) 11.4 - 15.2 seconds   INR 1.68   POC occult blood, ED  Result Value Ref Range   Fecal Occult Bld POSITIVE (A) NEGATIVE  Type and screen  Result Value Ref Range   ABO/RH(D) O POS      Antibody Screen NEG    Sample Expiration 09/02/2016    Unit Number [id number]    Blood Component Type RED CELLS,LR    Unit division 00    Status of Unit ALLOCATED    Transfusion Status OK TO TRANSFUSE    Crossmatch Result Compatible    Unit Number [id number]    Blood Component Type RED CELLS,LR    Unit division 00    Status of Unit ALLOCATED    Transfusion Status OK TO TRANSFUSE    Crossmatch Result Compatible   Prepare RBC  Result Value Ref Range   Order Confirmation ORDER PROCESSED BY BLOOD BANK   ABO/Rh  Result Value Ref Range   ABO/RH(D) O POS   BPAM RBC  Result Value Ref Range   Blood Product Unit Number [id number]    Unit Type and Rh 5100    Blood Product Expiration Date [id number]    Blood Product Unit Number [id number]    Unit Type and Rh 5100    Blood Product Expiration Date 848592763943        EKG  EKG Interpretation None       Radiology No results found.  Procedures Procedures (including critical care time)  Medications Ordered in ED Medications  0.9 %  sodium chloride infusion (not administered)     Initial Impression / Assessment and Plan / ED Course  I have reviewed the triage vital signs and the nursing notes.  Pertinent labs & imaging results that were available during my care of the patient were reviewed by me and considered in my medical decision making (see chart for details).  Iv ns. Labs sent.  Reviewed nursing notes and prior charts for additional history.   hgb 6.  Transfuse 2 units prbc. protonix iv.  Medical service consulted for admission.    Final Clinical Impressions(s) / ED Diagnoses   Final diagnoses:  None    New Prescriptions New Prescriptions   No medications on file     Lajean Saver, MD 08/30/16 1300

## 2016-08-30 NOTE — ED Triage Notes (Addendum)
Pt sent from cancer center with complaints of possible GI bleed. Pt reports having dark stools with metallic odor x2 days and increased fatigue. Nurse reports hemoglobin 6.7. Pt reports a hx of anemia. Currently being treated by Dr Irene Limbo at cancer center.

## 2016-08-30 NOTE — ED Notes (Signed)
Bed: HA68 Expected date:  Expected time:  Means of arrival:  Comments: [place] GI bleed

## 2016-08-30 NOTE — Patient Instructions (Signed)
Thank you for choosing [facility] to provide your oncology and hematology care.  To afford each patient quality time with our providers, please arrive 30 minutes before your scheduled appointment time.  If you arrive late for your appointment, you may be asked to reschedule.  We strive to give you quality time with our providers, and arriving late affects you and other patients whose appointments are after yours.  If you are a no show for multiple scheduled visits, you may be dismissed from the clinic at the providers discretion.   Again, thank you for choosing [facility], our hope is that these requests will decrease the amount of time that you wait before being seen by our physicians.  ______________________________________________________________________ Should you have questions after your visit to the  Cancer Center, please contact our office at [phone] between the hours of 8:30 and 4:30 p.m.    Voicemails left after 4:30p.m will not be returned until the following business day.   For prescription refill requests, please have your pharmacy contact us directly.  Please also try to allow 48 hours for prescription requests.   Please contact the scheduling department for questions regarding scheduling.  For scheduling of procedures such as PET scans, CT scans, MRI, Ultrasound, etc please contact central scheduling at [phone].   Resources For Cancer Patients and Caregivers:  American Cancer Society:  800-227-2345  Can help patients locate various types of support and financial assistance Cancer Care: 1-800-813-HOPE (4673) Provides financial assistance, online support groups, medication/co-pay assistance.   [place] DSS:  336-641-3447 Where to apply for food stamps, Medicaid, and utility assistance Medicare Rights Center: 800-333-4114 Helps people with Medicare understand their rights and benefits, navigate the Medicare system, and secure the  quality healthcare they deserve SCAT: 336-333-6589 [place] [employer]'s shared-ride transportation service for eligible riders who have a disability that prevents them from riding the fixed route bus.   For additional information on assistance programs please contact our social worker:   Grier Hock/Abigail Elmore:  [phone] 

## 2016-08-31 DIAGNOSIS — K766 Portal hypertension: Secondary | ICD-10-CM

## 2016-08-31 DIAGNOSIS — Z9861 Coronary angioplasty status: Secondary | ICD-10-CM | POA: Diagnosis not present

## 2016-08-31 DIAGNOSIS — I5032 Chronic diastolic (congestive) heart failure: Secondary | ICD-10-CM | POA: Diagnosis not present

## 2016-08-31 DIAGNOSIS — R161 Splenomegaly, not elsewhere classified: Secondary | ICD-10-CM

## 2016-08-31 DIAGNOSIS — Z794 Long term (current) use of insulin: Secondary | ICD-10-CM | POA: Diagnosis not present

## 2016-08-31 DIAGNOSIS — I10 Essential (primary) hypertension: Secondary | ICD-10-CM | POA: Diagnosis not present

## 2016-08-31 DIAGNOSIS — K7460 Unspecified cirrhosis of liver: Secondary | ICD-10-CM

## 2016-08-31 DIAGNOSIS — Z6836 Body mass index (BMI) 36.0-36.9, adult: Secondary | ICD-10-CM | POA: Diagnosis not present

## 2016-08-31 DIAGNOSIS — I110 Hypertensive heart disease with heart failure: Secondary | ICD-10-CM | POA: Diagnosis present

## 2016-08-31 DIAGNOSIS — E669 Obesity, unspecified: Secondary | ICD-10-CM | POA: Diagnosis present

## 2016-08-31 DIAGNOSIS — I714 Abdominal aortic aneurysm, without rupture: Secondary | ICD-10-CM | POA: Diagnosis present

## 2016-08-31 DIAGNOSIS — D500 Iron deficiency anemia secondary to blood loss (chronic): Secondary | ICD-10-CM | POA: Diagnosis not present

## 2016-08-31 DIAGNOSIS — D509 Iron deficiency anemia, unspecified: Secondary | ICD-10-CM | POA: Diagnosis not present

## 2016-08-31 DIAGNOSIS — D696 Thrombocytopenia, unspecified: Secondary | ICD-10-CM | POA: Diagnosis not present

## 2016-08-31 DIAGNOSIS — K921 Melena: Secondary | ICD-10-CM | POA: Diagnosis not present

## 2016-08-31 DIAGNOSIS — K5900 Constipation, unspecified: Secondary | ICD-10-CM | POA: Diagnosis present

## 2016-08-31 DIAGNOSIS — K922 Gastrointestinal hemorrhage, unspecified: Secondary | ICD-10-CM

## 2016-08-31 DIAGNOSIS — Z87891 Personal history of nicotine dependence: Secondary | ICD-10-CM | POA: Diagnosis not present

## 2016-08-31 DIAGNOSIS — R195 Other fecal abnormalities: Secondary | ICD-10-CM | POA: Diagnosis present

## 2016-08-31 DIAGNOSIS — R7989 Other specified abnormal findings of blood chemistry: Secondary | ICD-10-CM | POA: Diagnosis not present

## 2016-08-31 DIAGNOSIS — E782 Mixed hyperlipidemia: Secondary | ICD-10-CM | POA: Diagnosis present

## 2016-08-31 DIAGNOSIS — Z79899 Other long term (current) drug therapy: Secondary | ICD-10-CM | POA: Diagnosis not present

## 2016-08-31 DIAGNOSIS — I8500 Esophageal varices without bleeding: Secondary | ICD-10-CM

## 2016-08-31 DIAGNOSIS — D649 Anemia, unspecified: Secondary | ICD-10-CM | POA: Diagnosis not present

## 2016-08-31 DIAGNOSIS — E119 Type 2 diabetes mellitus without complications: Secondary | ICD-10-CM | POA: Diagnosis not present

## 2016-08-31 DIAGNOSIS — D62 Acute posthemorrhagic anemia: Secondary | ICD-10-CM

## 2016-08-31 DIAGNOSIS — I2510 Atherosclerotic heart disease of native coronary artery without angina pectoris: Secondary | ICD-10-CM | POA: Diagnosis present

## 2016-08-31 LAB — COMPREHENSIVE METABOLIC PANEL
ALBUMIN: 2.6 g/dL — AB (ref 3.5–5.0)
ALT: 28 U/L (ref 17–63)
ANION GAP: 8 (ref 5–15)
AST: 41 U/L (ref 15–41)
Alkaline Phosphatase: 75 U/L (ref 38–126)
BUN: 18 mg/dL (ref 6–20)
CHLORIDE: 105 mmol/L (ref 101–111)
CO2: 24 mmol/L (ref 22–32)
Calcium: 8.6 mg/dL — ABNORMAL LOW (ref 8.9–10.3)
Creatinine, Ser: 1.05 mg/dL (ref 0.61–1.24)
GFR calc Af Amer: >60 mL/min (ref >60)
GFR calc non Af Amer: >60 mL/min (ref >60)
GLUCOSE: 118 mg/dL — AB (ref 65–99)
POTASSIUM: 4.2 mmol/L (ref 3.5–5.1)
SODIUM: 137 mmol/L (ref 135–145)
Total Bilirubin: 5.4 mg/dL — ABNORMAL HIGH (ref 0.3–1.2)
Total Protein: 6.1 g/dL — ABNORMAL LOW (ref 6.5–8.1)

## 2016-08-31 LAB — TYPE AND SCREEN
ABO/RH(D): O POS
Antibody Screen: NEGATIVE
Unit division: 0
Unit division: 0

## 2016-08-31 LAB — BPAM RBC
BLOOD PRODUCT EXPIRATION DATE: 201808172359
BLOOD PRODUCT EXPIRATION DATE: 201808172359
ISSUE DATE / TIME: 201807241531
ISSUE DATE / TIME: 201807242033
UNIT TYPE AND RH: 5100
UNIT TYPE AND RH: 5100

## 2016-08-31 LAB — CBC
HEMATOCRIT: 23.5 % — AB (ref 39.0–52.0)
HEMATOCRIT: 25.5 % — AB (ref 39.0–52.0)
HEMOGLOBIN: 7.9 g/dL — AB (ref 13.0–17.0)
HEMOGLOBIN: 8.3 g/dL — AB (ref 13.0–17.0)
MCH: 27.8 pg (ref 26.0–34.0)
MCH: 27.4 pg (ref 26.0–34.0)
MCHC: 33.6 g/dL (ref 30.0–36.0)
MCHC: 32.5 g/dL (ref 30.0–36.0)
MCV: 82.7 fL (ref 78.0–100.0)
MCV: 84.2 fL (ref 78.0–100.0)
Platelets: 121 10*3/uL — ABNORMAL LOW (ref 150–400)
Platelets: 129 10*3/uL — ABNORMAL LOW (ref 150–400)
RBC: 2.84 MIL/uL — ABNORMAL LOW (ref 4.22–5.81)
RBC: 3.03 MIL/uL — ABNORMAL LOW (ref 4.22–5.81)
RDW: 15.3 % (ref 11.5–15.5)
RDW: 15.5 % (ref 11.5–15.5)
WBC: 6.2 10*3/uL (ref 4.0–10.5)
WBC: 8.8 10*3/uL (ref 4.0–10.5)

## 2016-08-31 LAB — GLUCOSE, CAPILLARY
GLUCOSE-CAPILLARY: 110 mg/dL — AB (ref 65–99)
Glucose-Capillary: 124 mg/dL — ABNORMAL HIGH (ref 65–99)
Glucose-Capillary: 109 mg/dL — ABNORMAL HIGH (ref 65–99)
Glucose-Capillary: 176 mg/dL — ABNORMAL HIGH (ref 65–99)
Glucose-Capillary: 89 mg/dL (ref 65–99)
Glucose-Capillary: 152 mg/dL — ABNORMAL HIGH (ref 65–99)

## 2016-08-31 LAB — OCCULT BLOOD X 1 CARD TO LAB, STOOL: FECAL OCCULT BLD: POSITIVE — AB

## 2016-08-31 LAB — HAPTOGLOBIN: HAPTOGLOBIN: 28 mg/dL — AB (ref 34–200)

## 2016-08-31 NOTE — Consult Note (Addendum)
Reason for Consult: Iron deficiency anemia. Referring Physician: [facility].  William [name] is an 66 y.o. male.  HPI: William [name] is a 66 year old white male, with 3 recent hospitalizations for iron deficiency anemia. He was rehospitalized yesterday after a visit to the hematology office when he was found to have a hemoglobin of 6.1 g/dL. He was also found to be guaiac positive for a fork on testing for occult blood today and this has been the pattern on previous admissions in the recent past as well. He's had an extensive GI evaluation including an EGD and a colonoscopy done in April this year. 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 EGD was essential normal in April, this year. Colonoscopy was essentially unrevealing a follow-up capsule study was essentially normal as well. The small bowel capsule enteroscopy was done on 07/31/2016 by William [name] when he was found to have   Evidence of portal hypertensive gastropathy in the fundus of the distal and grade 1 esophageal varix. He has been on chronic iron for several months and therefore has dark stools. He denies having any hematochezia. There is no other source of obvious blood loss in the form hematuria or coffee-ground emesis. He has had some problems with constipation and abdominal bloating and "day or 2 without a bowel movement. He has had an extensive evaluation by the hematology service including the myeloma panel which of all all of which have been unrevealing low-grade hemolysis could not be ruled out; he was asked to avoid use of anticoagulants. This is his third hospitalization since April for the iron deficiency anemia. His wife describes him as being sluggish and fatigued over the last week prior to admission. Today he describes herself as feeling much better after he received 2 units of packed red blood cells on admission.   Past Medical History:  Diagnosis Date  . AAA (abdominal aortic  aneurysm) ([place]) 03/2014   3.2 cm on MR abd.  F/u aortic u/s 06/2014 showed 3.0 x 3.1 cm AAA: recheck 2 yrs recommended (followed by cardiologist)  . Bilateral renal cysts    simple (03/2014 MRI)  . CAD (coronary artery disease)   . Cholelithiases 2018   asymptomatic  . Chronic diastolic heart failure ([place])   . Cirrhosis ([place]) 05/2016   secondary to NASH; most recent u/s abd 07/2016 showed splenomegaly.  Hx of portal HTN changes with mild ascites and splenomegaly.  . Diabetes mellitus with complication ([employer]) 12/209   A1c 6.8%  . Hyperlipemia, mixed    elevated LFTs when on statins.    . Hypertension    Cr bump 04/01/16 so I changed benicar-hct to benicar plain and added amlodipine 5 mg.  . Iron deficiency anemia 05/2016   Acute blood loss anemia: hospitalized, required transfusion.  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  W/u for ? hemolytic anemia to be pursued by hematologist as outpt.  . Microscopic hematuria    Eval unremarkable by William [name].  [name] NASH (nonalcoholic steatohepatitis)    Fatty liver on MR abd 03/2014, + hx of elevated transaminases and bili: followed by William [name].  . Obesity   . Spleen enlarged    Past Surgical History:  Procedure Laterality Date  . CARDIAC CATHETERIZATION    . CARDIOVASCULAR STRESS TEST  01/17/2012; 05/04/16   Normal stress nuclear study x 2 (2018--Normal perfusion. LVEF 66% with normal wall  motion. This is a low risk study).  . CERVICAL DISCECTOMY  1992  . COLONOSCOPY N/A 05/27/2016   No site/explanation for blood loss found.  Erythematous mucosa in cecum and ascending colon--Cecal bx: normal.  Procedure: COLONOSCOPY;  Surgeon: William [name];  Location[facility] ENDOSCOPY;  Service: Endoscopy;  Laterality: N/A;  . COLONOSCOPY W/ POLYPECTOMY  09/10/2014   Polypectomy x 2: recall 5 yrs (William [name]).  . CORONARY ANGIOPLASTY    . CORONARY ARTERY BYPASS GRAFT   03/2006  . ENTEROSCOPY N/A 07/31/2016   Procedure: ENTEROSCOPY;  Surgeon: William [name];  Location: [facility] ENDOSCOPY;  Service: Endoscopy;  Laterality: N/A;  . ESOPHAGOGASTRODUODENOSCOPY N/A 05/25/2016   No site/explanation for blood loss found.  Procedure: ESOPHAGOGASTRODUODENOSCOPY (EGD);  Surgeon: William [name];  Location: [facility] ENDOSCOPY;  Service: Endoscopy;  Laterality: N/A;  . GIVENS CAPSULE STUDY N/A 05/27/2016   Procedure: GIVENS CAPSULE STUDY;  Surgeon: William [name];  Location: [place];  Service: Endoscopy;  Laterality: N/A;  . NECK SURGERY  1991  . TRANSTHORACIC ECHOCARDIOGRAM  05/25/2016   EF 60%, normal wall motion, grd II DD, mild aortic stenosis, dilated aortic root and ascending aorta.  [name] VASECTOMY  1977   Family History  Problem Relation Age of Onset  . Hypertension Mother   . Cancer - Other Mother        liver cancer  . Heart disease Father   . Heart attack Father   . Cancer - Lung Father   . Diabetes Father   . Liver disease Sister   . Anemia Sister   . Heart disease Sister   . Heart attack Sister   . Heart disease Brother        CABG 20 YEARS AGO  . Hypertension Brother   . Mesothelioma Brother        HALF-BROTHER  . Cancer - Other Brother        CLL  . Cancer - Lung Brother        Mets to brain  . Cancer - Lung Sister        HALF-SISTER  . Liver disease Brother   . Heart disease Brother        CABG-2012  . Emphysema Maternal Grandfather   . Cancer - Other Paternal Grandmother        Stomach  . Heart attack Paternal Grandfather    Social History:  reports that he has quit smoking. His smoking use included Cigarettes. He has never used smokeless tobacco. He reports that he does not drink alcohol or use drugs.  Allergies:  Allergies  Allergen Reactions  . Statins Other (See Comments)    Liver enzymes go up whenever on them   Medications: I have reviewed the patient's current medications.  Results for orders placed or performed during the  hospital encounter of 08/30/16 (from the past 48 hour(s))  POC occult blood, ED     Status: Abnormal   Collection Time: 08/30/16 10:14 AM  Result Value Ref Range   Fecal Occult Bld POSITIVE (A) NEGATIVE  CBC     Status: Abnormal   Collection Time: 08/30/16 11:45 AM  Result Value Ref Range   WBC 6.3 4.0 - 10.5 K/uL   RBC 2.23 (L) 4.22 - 5.81 MIL/uL   Hemoglobin 6.1 (LL) 13.0 - 17.0 g/dL    Comment: REPEATED TO VERIFY CRITICAL RESULT CALLED TO, READ BACK BY AND VERIFIED WITH: JIMERSON, C. RN @1213  ON 7.24.18 BY NMCCOY    HCT 18.9 (  L) 39.0 - 52.0 %   MCV 84.8 78.0 - 100.0 fL   MCH 27.4 26.0 - 34.0 pg   MCHC 32.3 30.0 - 36.0 g/dL   RDW 16.2 (H) 11.5 - 15.5 %   Platelets 108 (L) 150 - 400 K/uL    Comment: REPEATED TO VERIFY SPECIMEN CHECKED FOR CLOTS PLATELET COUNT CONFIRMED BY SMEAR   Comprehensive metabolic panel     Status: Abnormal   Collection Time: 08/30/16 11:45 AM  Result Value Ref Range   Sodium 134 (L) 135 - 145 mmol/L   Potassium 4.0 3.5 - 5.1 mmol/L   Chloride 102 101 - 111 mmol/L   CO2 23 22 - 32 mmol/L   Glucose, Bld 119 (H) 65 - 99 mg/dL   BUN 15 6 - 20 mg/dL   Creatinine, Ser 1.02 0.61 - 1.24 mg/dL   Calcium 8.6 (L) 8.9 - 10.3 mg/dL   Total Protein 6.1 (L) 6.5 - 8.1 g/dL   Albumin 2.6 (L) 3.5 - 5.0 g/dL   AST 41 15 - 41 U/L   ALT 28 17 - 63 U/L   Alkaline Phosphatase 77 38 - 126 U/L   Total Bilirubin 3.7 (H) 0.3 - 1.2 mg/dL   GFR calc non Af Amer >60 >60 mL/min   GFR calc Af Amer >60 >60 mL/min    Comment: (NOTE) The eGFR has been calculated using the CKD EPI equation. This calculation has not been validated in all clinical situations. eGFR's persistently <60 mL/min signify possible Chronic Kidney Disease.    Anion gap 9 5 - 15  Type and screen     Status: None   Collection Time: 08/30/16 11:45 AM  Result Value Ref Range   ABO/RH(D) O POS    Antibody Screen NEG    Sample Expiration 09/02/2016    Unit Number [id number]    Blood Component Type RED  CELLS,LR    Unit division 00    Status of Unit ISSUED,FINAL    Transfusion Status OK TO TRANSFUSE    Crossmatch Result Compatible    Unit Number [id number]    Blood Component Type RED CELLS,LR    Unit division 00    Status of Unit ISSUED,FINAL    Transfusion Status OK TO TRANSFUSE    Crossmatch Result Compatible   Lactate dehydrogenase     Status: None   Collection Time: 08/30/16 11:45 AM  Result Value Ref Range   LDH 155 98 - 192 U/L  Haptoglobin     Status: Abnormal   Collection Time: 08/30/16 11:45 AM  Result Value Ref Range   Haptoglobin 28 (L) 34 - 200 mg/dL    Comment: (NOTE) Performed At: [facility] [place], [place] [id number] [place] Romp [name] [id number]   Protime-INR     Status: Abnormal   Collection Time: 08/30/16 11:45 AM  Result Value Ref Range   Prothrombin Time 20.0 (H) 11.4 - 15.2 seconds   INR 1.68   ABO/Rh     Status: None   Collection Time: 08/30/16 11:46 AM  Result Value Ref Range   ABO/RH(D) O POS   Prepare RBC     Status: None   Collection Time: 08/30/16  1:00 PM  Result Value Ref Range   Order Confirmation ORDER PROCESSED BY BLOOD BANK   Ferritin     Status: Abnormal   Collection Time: 08/30/16  2:55 PM  Result Value Ref Range   Ferritin 11 (L) 24 - 336 ng/mL  Comment: Performed at [facility], [place] [address], [place], [place] [address]  Iron and TIBC     Status: Abnormal   Collection Time: 08/30/16  2:55 PM  Result Value Ref Range   Iron 16 (L) 45 - 182 ug/dL   TIBC 500 (H) 250 - 450 ug/dL   Saturation Ratios 3 (L) 17.9 - 39.5 %   UIBC 484 ug/dL    Comment: Performed at [facility], [place] [address], [place], [place] [address]  Glucose, capillary     Status: Abnormal   Collection Time: 08/30/16  8:20 PM  Result Value Ref Range   Glucose-Capillary 105 (H) 65 - 99 mg/dL  Glucose, capillary     Status: Abnormal   Collection Time: 08/30/16 11:34 PM  Result Value Ref Range   Glucose-Capillary 119  (H) 65 - 99 mg/dL  Glucose, capillary     Status: Abnormal   Collection Time: 08/31/16  4:05 AM  Result Value Ref Range   Glucose-Capillary 124 (H) 65 - 99 mg/dL  Comprehensive metabolic panel     Status: Abnormal   Collection Time: 08/31/16  4:44 AM  Result Value Ref Range   Sodium 137 135 - 145 mmol/L   Potassium 4.2 3.5 - 5.1 mmol/L   Chloride 105 101 - 111 mmol/L   CO2 24 22 - 32 mmol/L   Glucose, Bld 118 (H) 65 - 99 mg/dL   BUN 18 6 - 20 mg/dL   Creatinine, Ser 1.05 0.61 - 1.24 mg/dL   Calcium 8.6 (L) 8.9 - 10.3 mg/dL   Total Protein 6.1 (L) 6.5 - 8.1 g/dL   Albumin 2.6 (L) 3.5 - 5.0 g/dL   AST 41 15 - 41 U/L   ALT 28 17 - 63 U/L   Alkaline Phosphatase 75 38 - 126 U/L   Total Bilirubin 5.4 (H) 0.3 - 1.2 mg/dL   GFR calc non Af Amer >60 >60 mL/min   GFR calc Af Amer >60 >60 mL/min    Comment: (NOTE) The eGFR has been calculated using the CKD EPI equation. This calculation has not been validated in all clinical situations. eGFR's persistently <60 mL/min signify possible Chronic Kidney Disease.    Anion gap 8 5 - 15  CBC     Status: Abnormal   Collection Time: 08/31/16  4:44 AM  Result Value Ref Range   WBC 6.2 4.0 - 10.5 K/uL    Comment: WHITE COUNT CONFIRMED ON SMEAR   RBC 2.84 (L) 4.22 - 5.81 MIL/uL   Hemoglobin 7.9 (L) 13.0 - 17.0 g/dL    Comment: DELTA CHECK NOTED REPEATED TO VERIFY POST TRANSFUSION SPECIMEN    HCT 23.5 (L) 39.0 - 52.0 %   MCV 82.7 78.0 - 100.0 fL   MCH 27.8 26.0 - 34.0 pg   MCHC 33.6 30.0 - 36.0 g/dL   RDW 15.3 11.5 - 15.5 %   Platelets 121 (L) 150 - 400 K/uL  Glucose, capillary     Status: Abnormal   Collection Time: 08/31/16  7:21 AM  Result Value Ref Range   Glucose-Capillary 109 (H) 65 - 99 mg/dL  Occult blood card to lab, stool RN will collect     Status: Abnormal   Collection Time: 08/31/16  9:12 AM  Result Value Ref Range   Fecal Occult Bld POSITIVE (A) NEGATIVE  Glucose, capillary     Status: Abnormal   Collection Time:  08/31/16 11:49 AM  Result Value Ref Range   Glucose-Capillary  176 (H) 65 - 99 mg/dL  CBC     Status: Abnormal   Collection Time: 08/31/16  2:40 PM  Result Value Ref Range   WBC 8.8 4.0 - 10.5 K/uL   RBC 3.03 (L) 4.22 - 5.81 MIL/uL   Hemoglobin 8.3 (L) 13.0 - 17.0 g/dL   HCT 25.5 (L) 39.0 - 52.0 %   MCV 84.2 78.0 - 100.0 fL   MCH 27.4 26.0 - 34.0 pg   MCHC 32.5 30.0 - 36.0 g/dL   RDW 15.5 11.5 - 15.5 %   Platelets 129 (L) 150 - 400 K/uL  Glucose, capillary     Status: None   Collection Time: 08/31/16  4:25 PM  Result Value Ref Range   Glucose-Capillary 89 65 - 99 mg/dL   Comment 1 Notify RN    Review of Systems  Constitutional: Positive for malaise/fatigue. Negative for chills, diaphoresis, fever and weight loss.  HENT: Negative.   Eyes: Negative.   Respiratory: Positive for shortness of breath.   Cardiovascular: Negative.   Gastrointestinal: Positive for blood in stool, constipation and melena. Negative for abdominal pain, diarrhea, heartburn, nausea and vomiting.  Genitourinary: Negative.   Musculoskeletal: Positive for joint pain.  Skin: Negative.   Neurological: Positive for weakness.  Endo/Heme/Allergies: Negative.   Psychiatric/Behavioral: Negative.    Blood pressure 102/60, pulse 75, temperature 98.8 F (37.1 C), temperature source Axillary, resp. rate 17, height 5' 8"  (1.727 m), weight 110.6 kg (243 lb 14.4 oz), SpO2 98 %. Physical Exam  Constitutional: He is oriented to person, place, and time. He appears well-developed and well-nourished.  HENT:  Head: Normocephalic and atraumatic.  Eyes: Pupils are equal, round, and reactive to light. Conjunctivae and EOM are normal.  Neck: Normal range of motion. Neck supple.  Cardiovascular: Normal rate and regular rhythm.   Respiratory: Effort normal and breath sounds normal.  GI: Soft. Bowel sounds are normal.  Musculoskeletal: Normal range of motion.  Neurological: He is alert and oriented to person, place, and time.   Skin: Skin is warm and dry.  Psychiatric: He has a normal mood and affect. His behavior is normal. Judgment and thought content normal.   Assessment/Plan: 1) Iron deficiency anemia/Blood in stool-with an essentially negative GI workup. As per my discussion with Dr. Irene Limbo over the phone plans moving may be made to refer the patient to [facility] for second opinion so that a small bowel push enteroscopy can be planned.  2) Nonalcoholic cirrhosis secondary to fatty liver disease with portal hypertensive gastropathy in the fundus,  distal grade 1 esophageal varix, thrombocytopenia and splenomegaly.  3) Constipation-Colace 100 mg PO BID advised.  4) Diabetes mellitus type 2.  5) Chronic diastolic congestive heart failure.  Kennette Cuthrell 08/31/2016, 5:06 PM

## 2016-08-31 NOTE — Plan of Care (Signed)
Problem: Health Behavior/Discharge Planning: Goal: Ability to manage health-related needs will improve Outcome: Progressing .  Problem: Physical Regulation: Goal: Ability to maintain clinical measurements within normal limits will improve Outcome: Progressing .

## 2016-08-31 NOTE — Progress Notes (Signed)
PROGRESS NOTE  William [name] JEH:[id number] DOB: 17-Mar-1950 DOA: 08/30/2016 PCP: Tammi Sou, MD Dr. [name] Dr. Irene Limbo  Brief Narrative: 66 year old male sent to the emergency department by oncology office for symptomatic anemia. Admitted for transfusion and further evaluation.  Assessment/Plan #1 Acute on chronic blood loss anemia, iron deficiency, recurrent GI bleed; s/p 2 units PRBC. Has required 10 units PRBCs total over 3 hospitalizations since April 2018. Per hematology note, hemolysis workup was unremarkable, myeloma panel and serum free light chains were unremarkable. Low level hemolysis cannot be ruled out. On this office visit 07/2016, GI follow-up was recommended. Avoid anticoagulants. Has had extensive GI investigation in the past including colonoscopy, EGD, capsule endoscopic, small bowel enteroscopy which was relatively unrevealing by report -Hemoglobin improved status post transfusion. Continue to trend hemoglobin. GI has been consult that, will evaluate later today. -Continue PPI infusion  #2 Nonalcoholic cirrhosis with associated portal hypertension, thrombocytopenia, splenomegaly, portal gastropathy, grade 1 esophageal varices -Appears stable.  Diabetes mellitus type 2 -Stable. Continue sliding scale insulin.  Chronic diastolic congestive heart failure -Appears euvolemic. Continue torsemide, beta blocker.   Clinically improved, however given recurrent bleeding and significant transfusion requirement over the last 3 months, remained in hospital, follow GI recommendations  DVT prophylaxis: SCDs Code Status: full Family Communication: wife at bedside Disposition Plan: home    [name], MD  [facility] Direct contact: [phone] --[place] [place]  --[email or web address]; password [email or web address]  7PM-7AM contact night coverage as above 08/31/2016, 10:36 AM  LOS: 0 days   Consultants:  GI  Procedures:    Antimicrobials:    Interval  history/Subjective: Feels good. No dizziness. Dark stools but no frank bleeding noted.  Objective: Vitals: Afebrile, 97.8, 73, 96/57, 96% on room air  Exam:     Constitutional: Appears calm, comfortable  Eyes. Pupils, irises, lids appear unremarkable.  ENT. Grossly normal hearing, lips.  Respiratory. Clear to auscultation bilaterally. No wheezes, rales or rhonchi. Normal respiratory effort.  Cardiovascular. Regular rate and rhythm. 2/6 systolic murmur. No rub or gallop. No lower extremity edema.  Psychiatric. Grossly normal mood and affect. Speech fluent and appropriate.   I have personally reviewed the following:   Labs:  Blood sugars stable  Complete metabolic panel notable for tota, iron deficiency anemia l bilirubin 5.4, appears to be at baseline  Serum ferritin was 11  -Hemoglobin up after transfusion, 7.9. Platelets stable 121.  Fecal occult blood positive  Imaging studies:    Medical tests:     Test discussed with performing physician:    Decision to obtain old records:    Review and summation of old records:  As noted above in assessment and plan, particularly last hematology note  Hospitalized for GI bleed 07/2016, seen by gastroenterology and underwent small bowel enteroscopy significant for grade 1 esophageal varices and portal hypertensive gastropathy but no active bleeding, recommendation to continue PPI and iron supplements, required 4 units PRBC and referred for hematology evaluation  Scheduled Meds: . feeding supplement (PRO-STAT SUGAR FREE 64)  30 mL Oral BID  . insulin aspart  0-9 Units Subcutaneous TID WC  . iron polysaccharides  150 mg Oral Daily  . mouth rinse  15 mL Mouth Rinse q12n4p  . metoprolol tartrate  12.5 mg Oral BID  . omega-3 acid ethyl esters  2 capsule Oral Daily  . potassium chloride SA  20 mEq Oral BID  . spironolactone  25 mg Oral Daily  . torsemide  40 mg Oral Daily   Continuous  Infusions: . pantoprozole  (PROTONIX) infusion 8 mg/hr (08/31/16 0545)    Active Problems:   Non-insulin treated type 2 diabetes mellitus (HCC)   Chronic diastolic heart failure (HCC)   Thrombocytopenia (HCC)   Acute blood loss anemia   Iron deficiency anemia due to chronic blood loss   GI bleed   Cirrhosis, nonalcoholic ([place])   Portal hypertension ([place])   Splenomegaly   Esophageal varices ([place])   LOS: 0 days

## 2016-09-01 DIAGNOSIS — I5032 Chronic diastolic (congestive) heart failure: Secondary | ICD-10-CM

## 2016-09-01 DIAGNOSIS — D62 Acute posthemorrhagic anemia: Secondary | ICD-10-CM

## 2016-09-01 DIAGNOSIS — D649 Anemia, unspecified: Secondary | ICD-10-CM

## 2016-09-01 DIAGNOSIS — K7460 Unspecified cirrhosis of liver: Secondary | ICD-10-CM

## 2016-09-01 DIAGNOSIS — K922 Gastrointestinal hemorrhage, unspecified: Principal | ICD-10-CM

## 2016-09-01 DIAGNOSIS — E119 Type 2 diabetes mellitus without complications: Secondary | ICD-10-CM

## 2016-09-01 DIAGNOSIS — K766 Portal hypertension: Secondary | ICD-10-CM

## 2016-09-01 LAB — CBC
HCT: 23.1 % — ABNORMAL LOW (ref 39.0–52.0)
Hemoglobin: 7.6 g/dL — ABNORMAL LOW (ref 13.0–17.0)
MCH: 27.7 pg (ref 26.0–34.0)
MCHC: 32.9 g/dL (ref 30.0–36.0)
MCV: 84.3 fL (ref 78.0–100.0)
PLATELETS: 96 10*3/uL — AB (ref 150–400)
RBC: 2.74 MIL/uL — ABNORMAL LOW (ref 4.22–5.81)
RDW: 15.5 % (ref 11.5–15.5)
WBC: 5.7 10*3/uL (ref 4.0–10.5)

## 2016-09-01 LAB — GLUCOSE, CAPILLARY
GLUCOSE-CAPILLARY: 106 mg/dL — AB (ref 65–99)
GLUCOSE-CAPILLARY: 99 mg/dL (ref 65–99)
GLUCOSE-CAPILLARY: 153 mg/dL — AB (ref 65–99)
Glucose-Capillary: 77 mg/dL (ref 65–99)
Glucose-Capillary: 112 mg/dL — ABNORMAL HIGH (ref 65–99)

## 2016-09-01 LAB — OCCULT BLOOD X 1 CARD TO LAB, STOOL: FECAL OCCULT BLD: POSITIVE — AB

## 2016-09-01 NOTE — Plan of Care (Signed)
Problem: Health Behavior/Discharge Planning: Goal: Ability to manage health-related needs will improve Outcome: Progressing .  Problem: Physical Regulation: Goal: Ability to maintain clinical measurements within normal limits will improve Outcome: Completed/Met Date Met: 09/01/16 . Goal: Will remain free from infection Outcome: Completed/Met Date Met: 09/01/16 No signs of infection noted. Continue to monitor.   Problem: Tissue Perfusion: Goal: Risk factors for ineffective tissue perfusion will decrease Outcome: Completed/Met Date Met: 09/01/16 .  Problem: Activity: Goal: Risk for activity intolerance will decrease Outcome: Progressing Up out of bed with stand by assist.  Steady gait.  Continue to monitor.   Problem: Fluid Volume: Goal: Ability to maintain a balanced intake and output will improve Outcome: Completed/Met Date Met: 09/01/16 .  Problem: Nutrition: Goal: Adequate nutrition will be maintained Outcome: Progressing On clear liquid diet. Tolerating well.  No nausea.   Problem: Bowel/Gastric: Goal: Will not experience complications related to bowel motility Outcome: Progressing LBM yesterday.  FOBT +. 2 more samples ordered.  Will continue with plan of care.

## 2016-09-01 NOTE — Progress Notes (Signed)
Subjective: Patient denies having any new complaints. Slight drop in hemoglobin noted. He had a BM today that was guaiac positive. He denies having any hematochezia.  Objective: Vital signs in last 24 hours: Temp:  [98.1 F (36.7 C)-99.2 F (37.3 C)] 98.1 F (36.7 C) (07/26 1351) Pulse Rate:  [73-76] 73 (07/26 1351) Resp:  [18] 18 (07/26 1351) BP: (98-111)/(51-59) 111/59 (07/26 1351) SpO2:  [96 %] 96 % (07/26 1351) Weight:  [108.6 kg (239 lb 8 oz)] 108.6 kg (239 lb 8 oz) (07/26 0359) Last BM Date: 08/31/16  Intake/Output from previous day: 07/25 0701 - 07/26 0700 In: 1025 [P.O.:540; I.V.:485] Out: 2875 [Urine:2875] Intake/Output this shift: Total I/O In: 252.9 [I.V.:252.9] Out: -   General appearance: alert, cooperative, fatigued, no distress, mildly obese and pale Resp: clear to auscultation bilaterally Cardio: regular rate and rhythm, S1, S2 normal, no murmur, click, rub or gallop GI: soft, non-tender; bowel sounds normal; no masses,  no organomegaly Extremities: extremities normal, atraumatic, no cyanosis or edema  Lab Results:  Recent Labs  08/31/16 0444 08/31/16 1440 09/01/16 0508  WBC 6.2 8.8 5.7  HGB 7.9* 8.3* 7.6*  HCT 23.5* 25.5* 23.1*  PLT 121* 129* 96*   BMET  Recent Labs  08/30/16 1145 08/31/16 0444  NA 134* 137  K 4.0 4.2  CL 102 105  CO2 23 24  GLUCOSE 119* 118*  BUN 15 18  CREATININE 1.02 1.05  CALCIUM 8.6* 8.6*   LFT  Recent Labs  08/31/16 0444  PROT 6.1*  ALBUMIN 2.6*  AST 41  ALT 28  ALKPHOS 75  BILITOT 5.4*   PT/INR  Recent Labs  08/30/16 1145  LABPROT 20.0*  INR 1.68   Medications: I have reviewed the patient's current medications.  Assessment/Plan: 1) Iron deficiency anemia with guaiac positive stools: I spoke to Dr. Cristi Loron at [facility] about a small bowel spirus enteroscopy but he felt that the yield of that test might be low as his capsule study was essentially normal. He plans to see the patient on an OP basis  and asked me to fax all his demographic information to his, which I did today. My hope is we can transfuse Mr. Hickok with another 2 units of PRBC's prior to discharge. My hope is that he will be seen at [facility] by next week. I will call Dr. Sharee Pimple office again tomorrow if I do not hear from them by mid-day. I have discussed this with Dr. Vance Gather as well. 2) NAFLD/Cirrhosis  3) AODM.  4) CHF.   LOS: 1 day   Iviona Hole 09/01/2016, 5:53 PM

## 2016-09-01 NOTE — Progress Notes (Signed)
HEMATOLOGY/ONCOLOGY CLINIC NOTE  Date of Service: .08/30/2016  Patient Care Team: Tammi Sou, MD as PCP - General (Family Medicine) Juanita Craver, MD as Consulting Physician (Gastroenterology) Franchot Gallo, MD as Consulting Physician (Urology) Troy Sine, MD as Consulting Physician (Cardiology)  CHIEF COMPLAINTS/PURPOSE OF CONSULTATION:  Anemia  HISTORY OF PRESENTING ILLNESS:   William [name] is a wonderful 66 y.o. male who has been referred to [place] by Dr .Anitra Lauth, Adrian Blackwater, MD for evaluation and management of Anemia.  Patient has a history of extensive medical comorbidities including liver cirrhosis related to [facility] with portal hypertension, esophageal varices and portal hypertensive gastropathy and splenomegaly, iron deficiency anemia, obesity, diabetes, AAA.  Patient was admitted in April 2018 with acute blood loss anemia and required transfusion of multiple units of PRBCs with iron profile suggestive of iron deficiency. No overt evidence of hemolysis noted. He had an EGD and colonoscopy that did not show any overt bleeding. Capsule endoscopy was unrevealing but the capsule past and only 27 minutes.  He follows with Dr. [name] who is his gastric oncologist.  Patient was again readmitted in June 2018 with symptomatically anemia and hemoglobin of 6.3. He underwent 4 units of PRBC transfusions again and was sent out on PPI and iron supplementation with the plan to follow-up with Dr. [name].  He was also given hematology referral. He had a repeat ultrasound of the abdomen on 07/29/2016 which showed significant increase in splenomegaly in 2 months suggesting significant portal hypertension versus some element of splenic sequestration.  He continues to note intermittent black stools.  Hemoglobin is stable and improved today and is up to 11.1. Patient notes he feels better after his transfusions. Has not noted lower GI bleeding at this time. We discuss any other workup  to rule out less likely other possibilities of his anemia.  INTERVAL HISTORY  Patient is here for follow-up of his anemia. He notes that he is feeling much more fatigued and has had intermittent dark stools suggesting melena. He has some mild dizziness and lightheadedness and his labs today showed hemoglobin of 6.7 which symptomatically anemia. He was admitted to the hospital through the emergency room for management of symptomatically anemia and workup for GI bleeding. He is agreeable with this plan. He is hemoglobin dropped despite taking oral iron for maintenance. He was asked to discontinue his oral iron.  MEDICAL HISTORY:  Past Medical History:  Diagnosis Date  . AAA (abdominal aortic aneurysm) ([place]) 03/2014   3.2 cm on MR abd.  F/u aortic u/s 06/2014 showed 3.0 x 3.1 cm AAA: recheck 2 yrs recommended (followed by cardiologist)  . Bilateral renal cysts    simple (03/2014 MRI)  . CAD (coronary artery disease)   . Cholelithiases 2018   asymptomatic  . Chronic diastolic heart failure ([place])   . Cirrhosis ([place]) 05/2016   secondary to NASH; most recent u/s abd 07/2016 showed splenomegaly.  Hx of portal HTN changes with mild ascites and splenomegaly.  . Diabetes mellitus with complication ([place]) 85/8850   A1c 6.8%  . Hyperlipemia, mixed    elevated LFTs when on statins.    . Hypertension    Cr bump 04/01/16 so I changed benicar-hct to benicar plain and added amlodipine 5 mg.  . Iron deficiency anemia 05/2016   Acute blood loss anemia: hospitalized, required transfusion.  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  W/u for ?  hemolytic anemia to be pursued by hematologist as outpt.  . Microscopic hematuria    Eval unremarkable by Dr. Eulogio Ditch.  [name] NASH (nonalcoholic steatohepatitis)    Fatty liver on MR abd 03/2014, + hx of elevated transaminases and bili: followed by Dr. [name].  . Obesity   . Spleen  enlarged     SURGICAL HISTORY: Past Surgical History:  Procedure Laterality Date  . CARDIAC CATHETERIZATION    . CARDIOVASCULAR STRESS TEST  01/17/2012; 05/04/16   Normal stress nuclear study x 2 (2018--Normal perfusion. LVEF 66% with normal wall motion. This is a low risk study).  . CERVICAL DISCECTOMY  1992  . COLONOSCOPY N/A 05/27/2016   No site/explanation for blood loss found.  Erythematous mucosa in cecum and ascending colon--Cecal bx: normal.  Procedure: COLONOSCOPY;  Surgeon: Carol Ada, MD;  Location: [facility] ENDOSCOPY;  Service: Endoscopy;  Laterality: N/A;  . COLONOSCOPY W/ POLYPECTOMY  09/10/2014   Polypectomy x 2: recall 5 yrs (Dr. [name]).  . CORONARY ANGIOPLASTY    . CORONARY ARTERY BYPASS GRAFT  03/2006  . ENTEROSCOPY N/A 07/31/2016   Procedure: ENTEROSCOPY;  Surgeon: Ladene Artist, MD;  Location[facility] ENDOSCOPY;  Service: Endoscopy;  Laterality: N/A;  . ESOPHAGOGASTRODUODENOSCOPY N/A 05/25/2016   No site/explanation for blood loss found.  Procedure: ESOPHAGOGASTRODUODENOSCOPY (EGD);  Surgeon: Juanita Craver, MD;  Location: [facility] ENDOSCOPY;  Service: Endoscopy;  Laterality: N/A;  . GIVENS CAPSULE STUDY N/A 05/27/2016   Procedure: GIVENS CAPSULE STUDY;  Surgeon: Carol Ada, MD;  Location: [place];  Service: Endoscopy;  Laterality: N/A;  . NECK SURGERY  1991  . TRANSTHORACIC ECHOCARDIOGRAM  05/25/2016   EF 60%, normal wall motion, grd II DD, mild aortic stenosis, dilated aortic root and ascending aorta.  [name] VASECTOMY  1977    SOCIAL HISTORY: Social History   Social History  . Marital status: Married    Spouse name: N/A  . Number of children: N/A  . Years of education: N/A   Occupational History  . Not on file.   Social History Main Topics  . Smoking status: Former Smoker    Types: Cigarettes  . Smokeless tobacco: Never Used     Comment: QUIT I 1983  . Alcohol use No  . Drug use: No  . Sexual activity: Yes   Other Topics Concern  . Not on file   Social History  Narrative   Married, 3 grown children, 3 GCs.   Educ: 10th grade.   Occupation: Retired Brewing technologist.   Tob: quit 1983, smoked about 20 pack-yr hx prior.   No alcohol.    FAMILY HISTORY: Family History  Problem Relation Age of Onset  . Hypertension Mother   . Cancer - Other Mother        liver cancer  . Heart disease Father   . Heart attack Father   . Cancer - Lung Father   . Diabetes Father   . Liver disease Sister   . Anemia Sister   . Heart disease Sister   . Heart attack Sister   . Heart disease Brother        CABG 20 YEARS AGO  . Hypertension Brother   . Mesothelioma Brother        HALF-BROTHER  . Cancer - Other Brother        CLL  . Cancer - Lung Brother        Mets to brain  . Cancer - Lung Sister        HALF-SISTER  .  Liver disease Brother   . Heart disease Brother        CABG-2012  . Emphysema Maternal Grandfather   . Cancer - Other Paternal Grandmother        Stomach  . Heart attack Paternal Grandfather     ALLERGIES:  is allergic to statins.  MEDICATIONS:  No current facility-administered medications for this visit.    No current outpatient prescriptions on file.   Facility-Administered Medications Ordered in Other Visits  Medication Dose Route Frequency Provider Last Rate Last Dose  . acetaminophen (TYLENOL) tablet 650 mg  650 mg Oral Q6H PRN Jani Gravel, MD       Or  . acetaminophen (TYLENOL) suppository 650 mg  650 mg Rectal Q6H PRN Jani Gravel, MD      . feeding supplement (PRO-STAT SUGAR FREE 64) liquid 30 mL  30 mL Oral BID Jani Gravel, MD   30 mL at 08/31/16 2204  . fluticasone (FLONASE) 50 MCG/ACT nasal spray 2 spray  2 spray Each Nare Daily PRN Jani Gravel, MD      . insulin aspart (novoLOG) injection 0-9 Units  0-9 Units Subcutaneous TID WC Jani Gravel, MD   2 Units at 08/31/16 1222  . iron polysaccharides (NIFEREX) capsule 150 mg  150 mg Oral Daily Jani Gravel, MD   150 mg at 08/31/16 0958  . MEDLINE mouth rinse  15 mL Mouth Rinse q12n4p Jani Gravel, MD   15 mL at 08/31/16 1600  . metoprolol tartrate (LOPRESSOR) tablet 12.5 mg  12.5 mg Oral BID Jani Gravel, MD   12.5 mg at 08/31/16 2203  . omega-3 acid ethyl esters (LOVAZA) capsule 2 g  2 capsule Oral Daily Jani Gravel, MD   2 g at 08/31/16 [phone]  . ondansetron (ZOFRAN) injection 4 mg  4 mg Intravenous Q6H PRN Jani Gravel, MD      . pantoprazole (PROTONIX) 80 mg in sodium chloride 0.9 % 250 mL (0.32 mg/mL) infusion  8 mg/hr Intravenous Continuous Jani Gravel, MD 25 mL/hr at 09/01/16 0205 8 mg/hr at 09/01/16 0205  . potassium chloride SA (K-DUR,KLOR-CON) CR tablet 20 mEq  20 mEq Oral BID Jani Gravel, MD   20 mEq at 08/31/16 2203  . spironolactone (ALDACTONE) tablet 25 mg  25 mg Oral Daily Jani Gravel, MD   25 mg at 08/31/16 7253  . torsemide (DEMADEX) tablet 40 mg  40 mg Oral Daily Jani Gravel, MD   40 mg at 08/31/16 6644    REVIEW OF SYSTEMS:    10 Point review of Systems was done is negative except as noted above.  PHYSICAL EXAMINATION: ECOG PERFORMANCE STATUS: 1 - Symptomatic but completely ambulatory  . Vitals:   08/30/16 0817  BP: (!) 109/58  Pulse: 79  Resp: 20  Temp: 98.7 F (37.1 C)   Filed Weights   08/30/16 0817  Weight: 250 lb 4.8 oz (113.5 kg)   .Body mass index is 37.5 kg/m.  GENERAL:alert, in no acute distress and comfortable SKIN: no acute rashes, no significant lesions EYES: conjunctiva are pink and non-injected, sclera anicteric OROPHARYNX: MMM, no exudates, no oropharyngeal erythema or ulceration NECK: supple, no JVD LYMPH:  no palpable lymphadenopathy in the cervical, axillary or inguinal regions LUNGS: clear to auscultation b/l with normal respiratory effort HEART: regular rate & rhythm ABDOMEN:  normoactive bowel sounds , non tender, not distended. Extremity: no pedal edema PSYCH: alert & oriented x 3 with fluent speech NEURO: no focal motor/sensory deficits  LABORATORY DATA:  I  have reviewed the data as listed Component     Latest Ref Rng &  Units 08/30/2016  Sodium     135 - 145 mmol/L 134 (L)  Potassium     3.5 - 5.1 mmol/L 4.0  Chloride     101 - 111 mmol/L 102  CO2     22 - 32 mmol/L 23  Glucose     65 - 99 mg/dL 119 (H)  BUN     6 - 20 mg/dL 15  Creatinine     0.61 - 1.24 mg/dL 1.02  Calcium     8.9 - 10.3 mg/dL 8.6 (L)  Total Protein     6.5 - 8.1 g/dL 6.1 (L)  Albumin     3.5 - 5.0 g/dL 2.6 (L)  AST     15 - 41 U/L 41  ALT     17 - 63 U/L 28  Alkaline Phosphatase     38 - 126 U/L 77  Total Bilirubin     0.3 - 1.2 mg/dL 3.7 (H)  GFR, Est Non African American     >60 mL/min >60  GFR, Est African American     >60 mL/min >60  Anion gap     5 - 15 9  WBC     4.0 - 10.5 K/uL 6.3  RBC     4.22 - 5.81 MIL/uL 2.23 (L)  Hemoglobin     13.0 - 17.0 g/dL 6.1 (LL)  HCT     39.0 - 52.0 % 18.9 (L)  MCV     78.0 - 100.0 fL 84.8  MCH     26.0 - 34.0 pg 27.4  MCHC     30.0 - 36.0 g/dL 32.3  RDW     11.5 - 15.5 % 16.2 (H)  Platelets     150 - 400 K/uL 108 (L)  Iron     45 - 182 ug/dL 16 (L)  TIBC     250 - 450 ug/dL 500 (H)  Saturation Ratios     17.9 - 39.5 % 3 (L)  UIBC     ug/dL 484  LDH     98 - 192 U/L 155  Haptoglobin     34 - 200 mg/dL 28 (L)  Ferritin     24 - 336 ng/mL 11 (L)    RADIOGRAPHIC STUDIES: I have personally reviewed the radiological images as listed and agreed with the findings in the report. No results found.  ASSESSMENT & PLAN:   66 year old male with multiple medical comorbidities with Karlene Lineman with liver cirrhosis with portal hypertension, esophageal varices and portal hypertensive gastropathy with  #1 Acute on Chronic blood loss anemia.  Patient has had recurrent GI bleeding requiring 9 units of PRBCs. Between April 2018 and June 2018. Today's hemoglobin has dropped back down to 6.7 with a drop in his ferritin level that shows that his anemia is consistent with ongoing GI bleeding and not related to primary hematologic disorder.  Previous workup showed LDH within  normal limits at 220 and suggests against overt hemolysis. Sedimentation rate within normal limits. Coombs' test is negative. Myeloma panel and serum free light chains suggest no monoclonal paraproteinemia. PNH testing negative Slightly lower haptoglobin levels can be related to decreased hepatic production, low-level hemolysis due to transfusions or extravascular hemolysis due to splenic or hepatic sequestration.  Some element of anemia could also be from his liver disease. Doubt significance Spurr cell anemia given that his baseline hemoglobin levels are  11. When he is not bleeding.  Plan -Patient was sent to the emergency room for inpatient admission for symptomatic anemia and GI bleeding. -He needs to receive PRBC transfusions and IV iron as inpatient. -Dr. [name] she is gastrologist did see him in in the hospital and she'll be arranging for referral to [facility] for single balloon or double-balloon enteroscopy to try to control his GI bleeding.  -If that is brisk GI bleeding could consider RBC tagged scan or an angiogram to determine the source of bleeding and possibly consider embolization. -Avoid all NSAIDs and other blood thinners. -Optimization of portal hypertension management as per his gastroenterologist. -In the setting of recurrent GI bleeding would hold off on oral iron replacement which might confuse the picture and does not seem to be holding his iron levels. -In the absence of controlling his GI bleeding I do not believe an outpatient hematology follow-up with IV iron when necessary we'll keep his anemia under check. -Goals of care discussions with his primary care physician and gastroenterologist.  RTC with Dr Irene Limbo 4 weeks post hospitalization with labs   All of the patients questions were answered with apparent satisfaction. The patient knows to call the clinic with any problems, questions or concerns.  I spent 25 minutes counseling the patient face to face. The total time spent  in the appointment was 40 minutes and more than 50% was on counseling and direct patient cares and co-ordination and arranging his transfer to the ED    Sullivan Lone MD [place] AAHIVMS [facility] Hematology/Oncology Physician [facility]  (Office):       [phone] (Work cell):  [phone] (Fax):           [phone]

## 2016-09-01 NOTE — Consult Note (Signed)
   [facility] CM Inpatient Consult   09/01/2016  William [name] January 03, 1951 [id number]    Came to visit [name] and wife at bedside on behalf of [employer] to [facility] Care Management program for [employer] employees/dependents with [facility] insurance. [name] endorses he has DM but does not take any medications for it.   Both he and wife endorse speaking with [facility] [place] post discharge after last hospitalization. He is agreeable to post discharge call. Wife, who is an employee is familiar with the [employer] [facility] program as well.   Provided [facility] [employer], 24-hr nurse line magnet, and contact information.   Appreciative of visit.  Inpatient RNCM aware.   Marthenia Rolling, MSN-Ed, RN,BSN [facility] Liaison [phone]

## 2016-09-01 NOTE — Progress Notes (Signed)
PROGRESS NOTE  William [name]  OZH:[id number] DOB: 12-Aug-1950 DOA: 08/30/2016 PCP: Tammi Sou, MD  Outpatient Specialists: Hoyt Koch Brief Narrative: 66 year old male sent to the emergency department by oncology office for symptomatic anemia. Admitted for transfusion and further evaluation.  Assessment & Plan: Active Problems:   Non-insulin treated type 2 diabetes mellitus (HCC)   Chronic diastolic heart failure (HCC)   Thrombocytopenia (HCC)   Acute blood loss anemia   Iron deficiency anemia due to chronic blood loss   GI bleed   Cirrhosis, nonalcoholic (HCC)   Portal hypertension (HCC)   Splenomegaly   Esophageal varices (HCC)  Acute on chronic blood loss anemia, iron deficiency, recurrent GI bleed: s/p 2 units PRBC. Has required 10 units PRBCs total over 3 hospitalizations since April 2018. Per hematology note, hemolysis workup was unremarkable, myeloma panel and serum free light chains were unremarkable. Low level hemolysis cannot be ruled out. On this office visit 07/2016, GI follow-up was recommended. Avoid anticoagulants. Has had extensive GI investigation in the past including colonoscopy, EGD, capsule endoscopic, small bowel enteroscopy which was relatively unrevealing by report. - GI considering transfer to [facility], continuing liquid diet. - Hgb dropped recurrently with +FOBT. Will monitor in AM or with any bleeding, hold transfusion given heart failure and symptoms of anemia having resolved.  - Continue PPI infusion  Nonalcoholic cirrhosis with associated portal hypertension, thrombocytopenia, splenomegaly, portal gastropathy, grade 1 esophageal varices - Appears stable.  Diabetes mellitus type 2 - Stable. Continue sliding scale insulin.  Chronic diastolic congestive heart failure: EDW 240lbs.  - Appears euvolemic. Continue torsemide, beta blocker.   Clinically improved, however given recurrent bleeding and significant transfusion requirement over the last 3  months, remained in hospital, follow GI recommendations  DVT prophylaxis: SCD Code Status: Full Family Communication: Wife at bedside Disposition Plan: Uncertain at this time, home vs. transfer.   Consultants:   GI, Dr. [name]  Procedures:   None  Antimicrobials:  None   Subjective: Denies dyspnea or chest pain, usually has fatigue with anemia which has improved. He's ambulated in the halls without symptoms. Stools remain dark. No abd pain, N/V.  Objective: Vitals:   08/31/16 1429 08/31/16 2004 09/01/16 0359 09/01/16 1351  BP: 102/60 (!) 111/56 (!) 98/51 (!) 111/59  Pulse: 75 76 74 73  Resp: 17 18 18 18   Temp: 98.8 F (37.1 C) 99.2 F (37.3 C) 98.4 F (36.9 C) 98.1 F (36.7 C)  TempSrc: Axillary Oral Oral Oral  SpO2: 98% 96% 96% 96%  Weight:   108.6 kg (239 lb 8 oz)   Height:        Intake/Output Summary (Last 24 hours) at 09/01/16 1451 Last data filed at 09/01/16 0553  Gross per 24 hour  Intake              785 ml  Output             1975 ml  Net            -1190 ml   Filed Weights   08/30/16 1450 08/31/16 0426 09/01/16 0359  Weight: 113.4 kg (250 lb) 110.6 kg (243 lb 14.4 oz) 108.6 kg (239 lb 8 oz)    Examination: General exam: 66 y.o. male in no distress Respiratory system: Non-labored breathing room air. Clear to auscultation bilaterally.  Cardiovascular system: Regular rate and rhythm. No murmur, rub, or gallop. No JVD, and no pedal edema. Gastrointestinal system: Abdomen soft, non-tender, non-distended, with normoactive bowel sounds. No organomegaly  or masses felt. Central nervous system: Alert and oriented. No focal neurological deficits. Extremities: Warm, no deformities Skin: No rashes, lesions no ulcers Psychiatry: Judgement and insight appear normal. Mood & affect appropriate.   Data Reviewed: I have personally reviewed following labs and imaging studies  CBC:  Recent Labs Lab 08/30/16 0803 08/30/16 1145 08/31/16 0444 08/31/16 1440  09/01/16 0508  WBC 7.2 6.3 6.2 8.8 5.7  NEUTROABS 4.0  --   --   --   --   HGB 6.7* 6.1* 7.9* 8.3* 7.6*  HCT 21.1* 18.9* 23.5* 25.5* 23.1*  MCV 84.5 84.8 82.7 84.2 84.3  PLT 133* 108* 121* 129* 96*   Basic Metabolic Panel:  Recent Labs Lab 08/30/16 1145 08/31/16 0444  NA 134* 137  K 4.0 4.2  CL 102 105  CO2 23 24  GLUCOSE 119* 118*  BUN 15 18  CREATININE 1.02 1.05  CALCIUM 8.6* 8.6*   GFR: Estimated Creatinine Clearance: 82.7 mL/min (by C-G formula based on SCr of 1.05 mg/dL). Liver Function Tests:  Recent Labs Lab 08/30/16 1145 08/31/16 0444  AST 41 41  ALT 28 28  ALKPHOS 77 75  BILITOT 3.7* 5.4*  PROT 6.1* 6.1*  ALBUMIN 2.6* 2.6*   No results for input(s): LIPASE, AMYLASE in the last 168 hours. No results for input(s): AMMONIA in the last 168 hours. Coagulation Profile:  Recent Labs Lab 08/30/16 1145  INR 1.68   Cardiac Enzymes: No results for input(s): CKTOTAL, CKMB, CKMBINDEX, TROPONINI in the last 168 hours. BNP (last 3 results)  Recent Labs  07/28/16 1416  PROBNP 120   HbA1C: No results for input(s): HGBA1C in the last 72 hours. CBG:  Recent Labs Lab 08/31/16 2000 09/01/16 0001 09/01/16 0401 09/01/16 0753 09/01/16 1134  GLUCAP 152* 110* 106* 99 153*   Lipid Profile: No results for input(s): CHOL, HDL, LDLCALC, TRIG, CHOLHDL, LDLDIRECT in the last 72 hours. Thyroid Function Tests: No results for input(s): TSH, T4TOTAL, FREET4, T3FREE, THYROIDAB in the last 72 hours. Anemia Panel:  Recent Labs  08/30/16 0803 08/30/16 1455  FERRITIN  --  11*  TIBC  --  500*  IRON  --  16*  RETICCTPCT 5.71*  --    Urine analysis:    Component Value Date/Time   COLORURINE YELLOW 08/01/2016 0542   APPEARANCEUR CLEAR 08/01/2016 0542   LABSPEC 1.010 08/01/2016 0542   PHURINE 5.0 08/01/2016 0542   GLUCOSEU NEGATIVE 08/01/2016 0542   HGBUR NEGATIVE 08/01/2016 0542   BILIRUBINUR NEGATIVE 08/01/2016 0542   KETONESUR NEGATIVE 08/01/2016 0542    PROTEINUR NEGATIVE 08/01/2016 0542   NITRITE NEGATIVE 08/01/2016 0542   LEUKOCYTESUR NEGATIVE 08/01/2016 0542   No results found for this or any previous visit (from the past 240 hour(s)).    Radiology Studies: No results found.  Scheduled Meds: . feeding supplement (PRO-STAT SUGAR FREE 64)  30 mL Oral BID  . insulin aspart  0-9 Units Subcutaneous TID WC  . iron polysaccharides  150 mg Oral Daily  . mouth rinse  15 mL Mouth Rinse q12n4p  . metoprolol tartrate  12.5 mg Oral BID  . omega-3 acid ethyl esters  2 capsule Oral Daily  . potassium chloride SA  20 mEq Oral BID  . spironolactone  25 mg Oral Daily  . torsemide  40 mg Oral Daily   Continuous Infusions: . pantoprozole (PROTONIX) infusion 8 mg/hr (09/01/16 1218)     LOS: 1 day   Time spent: 25 minutes.  Vance Gather, MD [facility] Pager [phone]  If 7PM-7AM, please contact night-coverage [email or web address] Password [email or web address] 09/01/2016, 2:51 PM

## 2016-09-02 DIAGNOSIS — R7989 Other specified abnormal findings of blood chemistry: Secondary | ICD-10-CM

## 2016-09-02 DIAGNOSIS — I10 Essential (primary) hypertension: Secondary | ICD-10-CM

## 2016-09-02 DIAGNOSIS — I8500 Esophageal varices without bleeding: Secondary | ICD-10-CM

## 2016-09-02 LAB — CBC
HCT: 23.1 % — ABNORMAL LOW (ref 39.0–52.0)
Hemoglobin: 7.5 g/dL — ABNORMAL LOW (ref 13.0–17.0)
MCH: 27.5 pg (ref 26.0–34.0)
MCHC: 32.5 g/dL (ref 30.0–36.0)
MCV: 84.6 fL (ref 78.0–100.0)
Platelets: 103 10*3/uL — ABNORMAL LOW (ref 150–400)
RBC: 2.73 MIL/uL — ABNORMAL LOW (ref 4.22–5.81)
RDW: 15.4 % (ref 11.5–15.5)
WBC: 5.6 10*3/uL (ref 4.0–10.5)

## 2016-09-02 LAB — GLUCOSE, CAPILLARY
GLUCOSE-CAPILLARY: 107 mg/dL — AB (ref 65–99)
Glucose-Capillary: 111 mg/dL — ABNORMAL HIGH (ref 65–99)
Glucose-Capillary: 148 mg/dL — ABNORMAL HIGH (ref 65–99)
Glucose-Capillary: 94 mg/dL (ref 65–99)

## 2016-09-02 LAB — PREPARE RBC (CROSSMATCH)

## 2016-09-02 MED ORDER — FUROSEMIDE 10 MG/ML IJ SOLN
60.0000 mg | Freq: Once | INTRAMUSCULAR | Status: AC
  Administered 2016-09-02: 60 mg via INTRAVENOUS
  Filled 2016-09-02: qty 6

## 2016-09-02 MED ORDER — PANTOPRAZOLE SODIUM 40 MG PO TBEC
40.0000 mg | DELAYED_RELEASE_TABLET | Freq: Two times a day (BID) | ORAL | Status: DC
  Administered 2016-09-03: 40 mg via ORAL
  Filled 2016-09-02: qty 1

## 2016-09-02 MED ORDER — SODIUM CHLORIDE 0.9 % IV SOLN
Freq: Once | INTRAVENOUS | Status: AC
  Administered 2016-09-02: 21:00:00 via INTRAVENOUS

## 2016-09-02 NOTE — Progress Notes (Addendum)
[facility] PROGRESS NOTE    Progress Note  William [name]  YBO:[id number] DOB: 1950-07-12 DOA: 08/30/2016 PCP: Tammi Sou, MD     Brief Narrative:   William [name] is an 66 y.o. male past medical history of iron deficiency anemia, anastomosis thrombocytopenia was seen by hematology and found to have a hemoglobin of 6.7 the patient relates orthostatic symptoms and dyspnea on exertion. She also noticed intermittent black stools since June.  Assessment/Plan:  Acute on chronic blood loss iron deficiency anemia secondary to recurrent GI bleed: She status post 2 units of packed red blood cells. Continue warfarin anticoagulation. Patient had an extensive GI workup in the PACS including colonoscopy, EGD Endoscopy and Small Bowel Enteroscopy Which Was Unrevealing. GI Was Consulted, Gastroenterology is considering discharging the patient in follow-up at [facility] as an outpatient, awaiting GI Recommendation Hbg  Is Stable. Will transfuse 1 additional unit give lasix and check a CBC in am.  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  Diabetes Moses type II: Blood glucose seems to be well controlled continue sliding scale insulin.  Chronic diastolic heart failure Abuse he really may continue torsemide and beta blockers.   DVT prophylaxis: SCd Family Communication:none Disposition Plan/Barrier to D/C: hopefully d/c in am Code Status:     Code Status Orders        Start     Ordered   08/30/16 1445  Full code  Continuous     08/30/16 1444    Code Status History    Date Active Date Inactive Code Status Order ID Comments User Context   07/29/2016 11:26 AM 08/01/2016  5:38 PM Full Code [id number]  Radene Gunning, NP ED   05/22/2016  9:22 AM 05/29/2016  3:14 PM Full Code [id number]  Elwin Mocha, MD ED        IV Access:    Peripheral IV   Procedures and diagnostic studies:   No results found.   Medical  Consultants:    None.  Anti-Infectives:   None  Subjective:    William [name] feels great no new complains.  Objective:    Vitals:   09/01/16 0359 09/01/16 1351 09/01/16 2210 09/02/16 0524  BP: (!) 98/51 (!) 111/59 (!) 118/57 (!) 92/58  Pulse: 74 73 90 77  Resp: 18 18 16 16   Temp: 98.4 F (36.9 C) 98.1 F (36.7 C) 98.2 F (36.8 C) 98.3 F (36.8 C)  TempSrc: Oral Oral Oral Oral  SpO2: 96% 96% 96% 94%  Weight: 108.6 kg (239 lb 8 oz)   109 kg (240 lb 3.2 oz)  Height:        Intake/Output Summary (Last 24 hours) at 09/02/16 0705 Last data filed at 09/02/16 0086  Gross per 24 hour  Intake              725 ml  Output              750 ml  Net              -25 ml   Filed Weights   08/31/16 0426 09/01/16 0359 09/02/16 0524  Weight: 110.6 kg (243 lb 14.4 oz) 108.6 kg (239 lb 8 oz) 109 kg (240 lb 3.2 oz)    Exam: General exam: In no acute distress. Respiratory system: Good air movement and clear to auscultation. Cardiovascular system: Regular in rhythm with positive was 1 S2. Gastrointestinal system: Positive bowel sounds soft nontender nondistended Central nervous system:  Awake alert 3. Extremities: Lower extremity edema. Skin: No rashes or ulcerations. Psychiatry: Judgment and insight appeared normal.   Data Reviewed:    Labs: Basic Metabolic Panel:  Recent Labs Lab 08/30/16 1145 08/31/16 0444  NA 134* 137  K 4.0 4.2  CL 102 105  CO2 23 24  GLUCOSE 119* 118*  BUN 15 18  CREATININE 1.02 1.05  CALCIUM 8.6* 8.6*   GFR Estimated Creatinine Clearance: 82.8 mL/min (by C-G formula based on SCr of 1.05 mg/dL). Liver Function Tests:  Recent Labs Lab 08/30/16 1145 08/31/16 0444  AST 41 41  ALT 28 28  ALKPHOS 77 75  BILITOT 3.7* 5.4*  PROT 6.1* 6.1*  ALBUMIN 2.6* 2.6*   No results for input(s): LIPASE, AMYLASE in the last 168 hours. No results for input(s): AMMONIA in the last 168 hours. Coagulation profile  Recent Labs Lab 08/30/16 1145    INR 1.68    CBC:  Recent Labs Lab 08/30/16 0803 08/30/16 1145 08/31/16 0444 08/31/16 1440 09/01/16 0508 09/02/16 0439  WBC 7.2 6.3 6.2 8.8 5.7 5.6  NEUTROABS 4.0  --   --   --   --   --   HGB 6.7* 6.1* 7.9* 8.3* 7.6* 7.5*  HCT 21.1* 18.9* 23.5* 25.5* 23.1* 23.1*  MCV 84.5 84.8 82.7 84.2 84.3 84.6  PLT 133* 108* 121* 129* 96* 103*   Cardiac Enzymes: No results for input(s): CKTOTAL, CKMB, CKMBINDEX, TROPONINI in the last 168 hours. BNP (last 3 results)  Recent Labs  07/28/16 1416  PROBNP 120   CBG:  Recent Labs Lab 09/01/16 0401 09/01/16 0753 09/01/16 1134 09/01/16 1636 09/01/16 2207  GLUCAP 106* 99 153* 77 112*   D-Dimer: No results for input(s): DDIMER in the last 72 hours. Hgb A1c: No results for input(s): HGBA1C in the last 72 hours. Lipid Profile: No results for input(s): CHOL, HDL, LDLCALC, TRIG, CHOLHDL, LDLDIRECT in the last 72 hours. Thyroid function studies: No results for input(s): TSH, T4TOTAL, T3FREE, THYROIDAB in the last 72 hours.  Invalid input(s): FREET3 Anemia work up:  Recent Labs  08/30/16 0803 08/30/16 1455  FERRITIN  --  11*  TIBC  --  500*  IRON  --  16*  RETICCTPCT 5.71*  --    Sepsis Labs:  Recent Labs Lab 08/31/16 0444 08/31/16 1440 09/01/16 0508 09/02/16 0439  WBC 6.2 8.8 5.7 5.6   Microbiology No results found for this or any previous visit (from the past 240 hour(s)).   Medications:   . feeding supplement (PRO-STAT SUGAR FREE 64)  30 mL Oral BID  . insulin aspart  0-9 Units Subcutaneous TID WC  . iron polysaccharides  150 mg Oral Daily  . mouth rinse  15 mL Mouth Rinse q12n4p  . metoprolol tartrate  12.5 mg Oral BID  . omega-3 acid ethyl esters  2 capsule Oral Daily  . potassium chloride SA  20 mEq Oral BID  . spironolactone  25 mg Oral Daily  . torsemide  40 mg Oral Daily   Continuous Infusions: . pantoprozole (PROTONIX) infusion 8 mg/hr (09/01/16 2239)      LOS: 2 days   Charlynne Cousins  [facility] Pager [phone]  *Please refer to [email or web address], password [email or web address] to get updated schedule on who will round on this patient, as hospitalists switch teams weekly. If 7PM-7AM, please contact night-coverage at [email or web address], password [email or web address] for any overnight needs.  09/02/2016, 7:05 AM

## 2016-09-02 NOTE — Care Management Note (Signed)
Case Management Note  Patient Details  Name: William [name] MRN: [id number] Date of Birth: [birth year]  Subjective/Objective: 66 y/o m admitted w/NIDDM. From home. Readmit.                   Action/Plan:d/c plan home.   Expected Discharge Date:   (unknown)               Expected Discharge Plan:  Home/Self Care  In-House Referral:     Discharge planning Services  CM Consult  Post Acute Care Choice:    Choice offered to:     DME Arranged:    DME Agency:     HH Arranged:    HH Agency:     Status of Service:  In process, will continue to follow  If discussed at Long Length of Stay Meetings, dates discussed:    Additional Comments:  Dessa Phi, RN 09/02/2016, 4:26 PM

## 2016-09-03 DIAGNOSIS — D500 Iron deficiency anemia secondary to blood loss (chronic): Secondary | ICD-10-CM

## 2016-09-03 LAB — CBC
HCT: 26.3 % — ABNORMAL LOW (ref 39.0–52.0)
Hemoglobin: 8.6 g/dL — ABNORMAL LOW (ref 13.0–17.0)
MCH: 27.5 pg (ref 26.0–34.0)
MCHC: 32.7 g/dL (ref 30.0–36.0)
MCV: 84 fL (ref 78.0–100.0)
PLATELETS: 97 10*3/uL — AB (ref 150–400)
RBC: 3.13 MIL/uL — AB (ref 4.22–5.81)
RDW: 15.2 % (ref 11.5–15.5)
WBC: 5.9 10*3/uL (ref 4.0–10.5)

## 2016-09-03 LAB — TYPE AND SCREEN
ABO/RH(D): O POS
ANTIBODY SCREEN: NEGATIVE
Unit division: 0

## 2016-09-03 LAB — GLUCOSE, CAPILLARY
GLUCOSE-CAPILLARY: 90 mg/dL (ref 65–99)
Glucose-Capillary: 114 mg/dL — ABNORMAL HIGH (ref 65–99)

## 2016-09-03 LAB — BPAM RBC
BLOOD PRODUCT EXPIRATION DATE: 201808292359
ISSUE DATE / TIME: 201807272107
UNIT TYPE AND RH: 5100

## 2016-09-03 MED ORDER — PANTOPRAZOLE SODIUM 40 MG PO TBEC: 40.0000 mg | DELAYED_RELEASE_TABLET | Freq: Two times a day (BID) | ORAL | 0 refills | Status: DC

## 2016-09-03 MED ORDER — PANTOPRAZOLE SODIUM 40 MG PO TBEC
40.0000 mg | DELAYED_RELEASE_TABLET | Freq: Two times a day (BID) | ORAL | 0 refills | Status: DC
Start: 2016-09-03 — End: 2016-10-11

## 2016-09-03 NOTE — Discharge Summary (Addendum)
Physician Discharge Summary  William [name] [id number] DOB: 1950-07-24 DOA: 08/30/2016  PCP: Tammi Sou, MD  Admit date: 08/30/2016 Discharge date: 09/05/2016  Admitted From: Home Disposition: Home   Recommendations for Outpatient Follow-up:  1. Follow up with GI 7/31 with repeat CBC  Home Health: None Equipment/Devices: None Discharge Condition: Stable CODE STATUS: Full Diet recommendation: Heart healthy, carb-modified  Brief/Interim Summary: 66 year old male sent to the emergency department by oncology office for symptomatic anemia. Admitted for transfusion and further evaluation.  Discharge Diagnoses:  Active Problems:   Non-insulin treated type 2 diabetes mellitus (HCC)   Chronic diastolic heart failure (HCC)   Thrombocytopenia (HCC)   Acute blood loss anemia   Iron deficiency anemia due to chronic blood loss   Acute GI bleeding   Cirrhosis, nonalcoholic (HCC)   Portal hypertension (HCC)   Splenomegaly   Esophageal varices (HCC)  Acute on chronic blood loss anemia, iron deficiency, recurrent GI bleed: s/p 2 units PRBC. Has required 10units PRBCs total over 3 hospitalizations since April 2018. Per hematology note, hemolysis workup was unremarkable, myeloma panel and serum free light chains were unremarkable. Low level hemolysis cannot be ruled out. Has had extensive GI investigation in the past including colonoscopy, EGD, capsule endoscopy, small bowel enteroscopy which was unrevealing by report. - Follow up with Dr. [name] 7/31 for repeat CBC. She has arranged for follow up with [facility], Dr. Cristi Loron - Continue iron and PPI - DC Hgb 8.6  Nonalcoholic cirrhosis with associated portal hypertension, thrombocytopenia, splenomegaly, portal gastropathy, grade 1 esophageal varices - Appears stable.  Diabetes mellitus type 2 - Stable. Continue sliding scale insulin.  Chronic diastolic congestive heart failure: EDW 240lbs. Was 236lbs at discharge. - Appears  euvolemic. Continue torsemide, beta blocker.  Discharge Instructions Discharge Instructions    AMB Referral to [facility]    Complete by:  As directed    Please assign UMR member for post discharge call. Provided Link to [employer]. Currently at [facility]. Thanks. Marthenia Rolling, [place], [facility] [phone]   Reason for consult:  Please assign UMR member for post discharge call   Diagnoses of:   Diabetes Heart Failure     Expected date of contact:  1-3 days (reserved for hospital discharges)   Discharge instructions    Complete by:  As directed    William were admitted for GI bleeding from an unclear source. William have improved with transfusions and are stable for discharge with the following recommendations/plans:  - Continue taking medications as William were - Follow up with Dr. [name] Tuesday for recheck of hemoglobin.  - If William notice any red blood in stools, have any vomiting or new abdominal pain, or symptoms of anemia (lightheadedness, weakness, shortness of breath, etc.) seek medical attention right away.  - Call [facility] to schedule an appointment with GI there as we've discussed.     Allergies as of 09/03/2016      Reactions   Statins Other (See Comments)   Liver enzymes go up whenever on them      Medication List    TAKE these medications   fluticasone 50 MCG/ACT nasal spray Commonly known as:  FLONASE Place 2 sprays into both nostrils daily. What changed:  when to take this  reasons to take this   iron polysaccharides 150 MG capsule Commonly known as:  NIFEREX Take 1 capsule (150 mg total) by mouth daily.   metoprolol tartrate 25 MG tablet Commonly known as:  LOPRESSOR Take 0.5 tablets (12.5 mg total) by mouth 2 (two) times daily.   omega-3 acid ethyl esters 1 g capsule Commonly known as:  LOVAZA TAKE 2 CAPSULES BY MOUTH ONCE DAILY What changed:  See the new instructions.   pantoprazole 40 MG tablet Commonly known as:   PROTONIX Take 1 tablet (40 mg total) by mouth 2 (two) times daily before a meal.   potassium chloride SA 20 MEQ tablet Commonly known as:  K-DUR,KLOR-CON Take 1 tablet (20 mEq total) by mouth 2 (two) times daily.   spironolactone 25 MG tablet Commonly known as:  ALDACTONE Take 1 tablet (25 mg total) by mouth daily.   torsemide 20 MG tablet Commonly known as:  DEMADEX Take 2 tablets (40 mg total) by mouth daily.      Follow-up Information    McGowen, Adrian Blackwater, MD Follow up.   Specialty:  Family Medicine Contact information: [address] [place] [address] [phone]        Juanita Craver, MD. Call.   Specialty:  Gastroenterology Contact information: [address], [place] [address] [phone]          Allergies  Allergen Reactions  . Statins Other (See Comments)    Liver enzymes go up whenever on them   Consultations:  GI, Dr. [name]  Procedures/Studies: None  Subjective: Feels well, no GI bleeding.   Discharge Exam: BP 101/60 (BP Location: Right Arm)   Pulse 77   Temp 98.3 F (36.8 C) (Oral)   Resp 18   Ht 5' 8"  (1.727 m)   Wt 107.5 kg (236 lb 15.9 oz)   SpO2 94%   BMI 36.03 kg/m   General: Pt is alert, awake, not in acute distress Cardiovascular: RRR, S1/S2 +, no rubs, no gallops Respiratory: CTA bilaterally, no wheezing, no rhonchi Abdominal: Soft, NT, ND, bowel sounds + Extremities: No edema, no cyanosis  Labs: Basic Metabolic Panel:  Recent Labs Lab 08/30/16 1145 08/31/16 0444  NA 134* 137  K 4.0 4.2  CL 102 105  CO2 23 24  GLUCOSE 119* 118*  BUN 15 18  CREATININE 1.02 1.05  CALCIUM 8.6* 8.6*   Liver Function Tests:  Recent Labs Lab 08/30/16 1145 08/31/16 0444  AST 41 41  ALT 28 28  ALKPHOS 77 75  BILITOT 3.7* 5.4*  PROT 6.1* 6.1*  ALBUMIN 2.6* 2.6*   CBC:  Recent Labs Lab 08/30/16 0803  08/31/16 0444 08/31/16 1440 09/01/16 0508 09/02/16 0439 09/03/16 0452  WBC 7.2  < > 6.2  8.8 5.7 5.6 5.9  NEUTROABS 4.0  --   --   --   --   --   --   HGB 6.7*  < > 7.9* 8.3* 7.6* 7.5* 8.6*  HCT 21.1*  < > 23.5* 25.5* 23.1* 23.1* 26.3*  MCV 84.5  < > 82.7 84.2 84.3 84.6 84.0  PLT 133*  < > 121* 129* 96* 103* 97*  < > = values in this interval not displayed.  Time coordinating discharge: Approximately 40 minutes  Vance Gather, MD  [facility] 09/05/2016, 5:07 PM Pager [phone]

## 2016-09-05 ENCOUNTER — Telehealth: Payer: Self-pay

## 2016-09-05 MED FILL — TORSEMIDE 20 MG TABLET: 20 | 30 days supply | Qty: 60 | Fill #2

## 2016-09-05 MED FILL — POLY-IRON 150 MG CAPSULE: 150 | 30 days supply | Qty: 30 | Fill #2

## 2016-09-05 MED FILL — PANTOPRAZOLE SOD DR 40 MG T: 40 | 30 days supply | Qty: 60 | Fill #0

## 2016-09-05 MED FILL — POTASSIUM CL ER 20 MEQ TABL: 20 | 30 days supply | Qty: 60 | Fill #2

## 2016-09-05 NOTE — Telephone Encounter (Signed)
Transition Care Management Follow-up Telephone Call   Date discharged? 09/03/2016   How have you been since you were released from the hospital? "little weak, but other than that, feeling fine"   Do you understand why you were in the hospital? yes, "my blood was low"   Do you understand the discharge instructions? yes   Where were you discharged to? Home. Lives with wife.    Items Reviewed:  Medications reviewed: yes  Allergies reviewed: yes  Dietary changes reviewed: yes  Referrals reviewed: yes   Functional Questionnaire:   Activities of Daily Living (ADLs):   He states they are independent in the following: ambulation, bathing and hygiene, feeding, continence, grooming, toileting and dressing States they require assistance with the following: None   Any transportation issues/concerns?: no   Any patient concerns? no   Confirmed importance and date/time of follow-up visits scheduled yes  Provider Appointment booked with PCP on Friday, 09/16/2016 @ 1:30pm.   Confirmed with patient if condition begins to worsen call PCP or go to the ER.  Patient was given the office number and encouraged to call back with question or concerns.  : yes

## 2016-09-05 NOTE — Telephone Encounter (Signed)
Noted: TCM phone contact with pt today.

## 2016-09-08 DIAGNOSIS — D509 Iron deficiency anemia, unspecified: Secondary | ICD-10-CM | POA: Diagnosis not present

## 2016-09-08 DIAGNOSIS — K7460 Unspecified cirrhosis of liver: Secondary | ICD-10-CM | POA: Diagnosis not present

## 2016-09-08 DIAGNOSIS — K921 Melena: Secondary | ICD-10-CM | POA: Diagnosis not present

## 2016-09-12 ENCOUNTER — Ambulatory Visit (INDEPENDENT_AMBULATORY_CARE_PROVIDER_SITE_OTHER): Payer: 59 | Admitting: Cardiovascular Disease

## 2016-09-12 ENCOUNTER — Other Ambulatory Visit: Payer: Self-pay | Admitting: *Deleted

## 2016-09-12 ENCOUNTER — Telehealth: Payer: Self-pay | Admitting: Pharmacist Clinician (PhC)/ Clinical Pharmacy Specialist

## 2016-09-12 ENCOUNTER — Encounter: Payer: Self-pay | Admitting: Cardiovascular Disease

## 2016-09-12 VITALS — BP 122/52 | HR 74 | Ht 68.5 in | Wt 245.8 lb

## 2016-09-12 DIAGNOSIS — Z8719 Personal history of other diseases of the digestive system: Secondary | ICD-10-CM | POA: Diagnosis not present

## 2016-09-12 DIAGNOSIS — I10 Essential (primary) hypertension: Secondary | ICD-10-CM | POA: Diagnosis not present

## 2016-09-12 DIAGNOSIS — E785 Hyperlipidemia, unspecified: Secondary | ICD-10-CM

## 2016-09-12 DIAGNOSIS — I714 Abdominal aortic aneurysm, without rupture, unspecified: Secondary | ICD-10-CM

## 2016-09-12 DIAGNOSIS — I2510 Atherosclerotic heart disease of native coronary artery without angina pectoris: Secondary | ICD-10-CM

## 2016-09-12 DIAGNOSIS — Z951 Presence of aortocoronary bypass graft: Secondary | ICD-10-CM

## 2016-09-12 DIAGNOSIS — I350 Nonrheumatic aortic (valve) stenosis: Secondary | ICD-10-CM | POA: Diagnosis not present

## 2016-09-12 DIAGNOSIS — I5032 Chronic diastolic (congestive) heart failure: Secondary | ICD-10-CM

## 2016-09-12 NOTE — Patient Outreach (Addendum)
[facility]) Care Management  09/12/2016  William [name] Apr 24, 1950 [id number]   Subjective: Telephone call to patient's home  number, no answer, left HIPAA compliant voicemail message, and requested call back.  Objective:Per chart review, patient hospitalized  08/30/16 - 09/05/16 for anemia.  Patient hospitalized 07/29/16 - 08/01/16, and 05/22/16 - 05/29/16 for gastrointestinal bleed. Patient also has a history of Acute blood loss anemia, Hyperbilirubinemia, Liver Cirrhoses/NAFLD (nonalcoholic fatty liver disease), Acute on chronic diastolic heart failure, CAD, hypertension, diabetes, mixed hyperlipidemia, and status post CABG in 2008.  Transition of care follow up completed by primary MD's office on 09/05/16.  Most recent [facility] transition of care follow up completed on 08/15/16.    Assessment:Received UMR Transition of care referral on 09/01/16.  Transition of care follow up pending patient contact.    Plan: RNCM will call patient for 2nd telephone outreach attempt, transition of care follow up, and verify if any [facility] access needs, within 10 business days if no return call.     Laquida Cotrell H. Annia Friendly, BSN, [facility] Telephonic CM Phone: [phone] Fax: [phone]

## 2016-09-12 NOTE — Telephone Encounter (Signed)
Patient with history of CABG, medical history also significant for chronic diastolic heart failure, hypertension, anemia, non-alcoholic cirrhosis and obesity.    Family history significant for brother with MI/CABG at age 67, another brother MI at 65 and father with MI/CABG at 48.    Patient eats mix of home cooked and restaurant foods.  Does not eat fried foods and avoids breads/pastas/potatoes.   Endorses eating mostly grilled chicken and salads.    Quit smoking in 1983, drinks no alcohol.  Unable to exercise at this time due to anemia, being now referred to [facility] as testing at [facility] was unremarkable.    Have put in request for paper chart to determine complete statin history and associated labs. Patient saw Dr. Tami Ribas until 2008 when he was switched to Dr. Claiborne Billings.

## 2016-09-12 NOTE — Patient Instructions (Signed)
Your physician recommends that you continue on your current medications as directed. Please refer to the Current Medication list given to you today.   Your physician wants you to follow-up in: 4 MONTHS with Dr. Claiborne Billings. You will receive a reminder letter in the mail two months in advance. If you don't receive a letter, please call our office to schedule the follow-up appointment.

## 2016-09-12 NOTE — Progress Notes (Signed)
Patient ID: William [name], male   DOB: 11-05-1950, 66 y.o.   MRN: [id number]    HPI: William [name] is a 66 y.o. male who presents to the office today for a 2-monthcardiology evaluation.  William [name] known CAD and underwent CABG revascularization surgery with a LIMA to the LAD, SVG to the RCA, SVG to the circumflex vessel in February 2008.  He has a history of obesity, metabolic syndrome, and mixed hyperlipidemia with preponderance of small dense LDL some particles and increased TG for which he has been on combination therapy.  In December 2013 a nuclear perfusion study was unchanged from 2 years previously and did not show significant scar or ischemia. In January 2014,  I adjusted his antihypertensive medications and started  Benicar HCT 40/25 for optimal blood pressure control with increase diuretic regimen and took him off his Avalide. He has felt improved on this therapy.  According to his wife, his blood pressure at home typically is in the 120s systolially and in the 775Fdiastolically.  He has a history of moderate obesity.  He does not routinely exercise.  He also does note some dietary discretion. He has noticed some mild leg swelling intermittently. He denies recent anginal symptoms. He denies palpitations.   In 2016 he underwent a urologic evaluation for gross hematuria. A CT of his abdomen and pelvis did not reveal any evidence for renal calculi or hydronephrosis or evidence for renal calculi or dilatation. He did have small benign renal cysts. He was also noted a 3.5 cm infrarenal abdominal aortic aneurysm. There was also a suggestion of fatty infiltration of his liver.  He underwent a follow-up abdominal aortic ultrasound on 06/27/2014.  This showed his distal aorta measuring 3.0 x 3.1 cm and he had mild amount of atherosclerosis.  He underwent a  colonoscopy by Dr. JTyrone Sageand was found to have 2 benign polyps.  He has been off  Crestor and niacin since last year due to elevation of  his liver enzymes. There is family history for liver disease in that his mother died from liver disease and his brother had a liver transplant.  In July 2016, his AST was 51 and ALT was 54.  Subsequent blood work by Dr. MCollene Maresrevealed his AST increased at 87 and ALT 68.  On 01/15/2015, his AST was 85 and ALT 64.  At that time, total cholesterol was 244, triglycerides 149, HDL 30, and his LDL cholesterol on statin therapy had risen to 184.   WhenI last saw him he denied chest tightness.  He was not been successful with weight loss and in fact has gained some weight.  Labs checked by his primary M.D.showed a hemoglobin A1c was 6.6.  LFTs had slightly improved but AST was still elevated at 71 and his ALT was now normal.   2.  As I last saw him, he underwent a nuclear perfusion study in March 2018.  This showed normal perfusion.  Ejection fraction was 66% with normal wall motion.  The test was felt to be low risk.  He underwent an echo Doppler study in April 2012 which showed an EF of 60-65% with mild LVH.  There was grade 2 diastolic dysfunction and increased filling pressures.  There was mild aortic stenosis with a mean gradient of 23 and peak gradient of 36, mild aortic root dilatation, mild biatrial enlargement, and mild TR.  He has had recurrent admissions for anemia and GI blood loss.  He's had  undergone evaluations by Dr. [name], long, and [place].  He tells me he will be seeing Dr. [name]  at [facility] for further GI evaluation and possible procedures.  He has been intolerant to statin therapy secondary to LFT elevation.  He denies recurrent chest pain or palpitations, PND, orthopnea.  He presents for evaluation.   Past Medical History:  Diagnosis Date  . AAA (abdominal aortic aneurysm) ([place]) 03/2014   3.2 cm on MR abd.  F/u aortic u/s 06/2014 showed 3.0 x 3.1 cm AAA: recheck 2 yrs recommended (followed by cardiologist)  . Bilateral renal cysts    simple (03/2014 MRI)  . CAD (coronary artery disease)   .  Cholelithiases 2018   asymptomatic  . Chronic diastolic heart failure ([place])   . Cirrhosis ([place]) 05/2016   secondary to NASH; most recent u/s abd 07/2016 showed splenomegaly.  Hx of portal HTN changes with mild ascites and splenomegaly.  . Diabetes mellitus with complication ([place]) 04/5463   A1c 6.8%  . Hyperlipemia, mixed    elevated LFTs when on statins.    . Hypertension    Cr bump 04/01/16 so I changed benicar-hct to benicar plain and added amlodipine 5 mg.  . Iron deficiency anemia 05/2016   Acute blood loss anemia: hospitalized, required transfusion.  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  W/u for ? hemolytic anemia to be pursued by hematologist as outpt.  . Microscopic hematuria    Eval unremarkable by Dr. Eulogio Ditch.  [name] NASH (nonalcoholic steatohepatitis)    Fatty liver on MR abd 03/2014, + hx of elevated transaminases and bili: followed by Dr. [name].  . Obesity   . Spleen enlarged     Past Surgical History:  Procedure Laterality Date  . CARDIAC CATHETERIZATION    . CARDIOVASCULAR STRESS TEST  01/17/2012; 05/04/16   Normal stress nuclear study x 2 (2018--Normal perfusion. LVEF 66% with normal wall motion. This is a low risk study).  . CERVICAL DISCECTOMY  1992  . COLONOSCOPY N/A 05/27/2016   No site/explanation for blood loss found.  Erythematous mucosa in cecum and ascending colon--Cecal bx: normal.  Procedure: COLONOSCOPY;  Surgeon: Carol Ada, MD;  Location: [facility] ENDOSCOPY;  Service: Endoscopy;  Laterality: N/A;  . COLONOSCOPY W/ POLYPECTOMY  09/10/2014   Polypectomy x 2: recall 5 yrs (Dr. [name]).  . CORONARY ANGIOPLASTY    . CORONARY ARTERY BYPASS GRAFT  03/2006  . ENTEROSCOPY N/A 07/31/2016   Procedure: ENTEROSCOPY;  Surgeon: Ladene Artist, MD;  Location: [facility] ENDOSCOPY;  Service: Endoscopy;  Laterality: N/A;  . ESOPHAGOGASTRODUODENOSCOPY N/A 05/25/2016   No site/explanation for blood  loss found.  Procedure: ESOPHAGOGASTRODUODENOSCOPY (EGD);  Surgeon: Juanita Craver, MD;  Location: [facility] ENDOSCOPY;  Service: Endoscopy;  Laterality: N/A;  . GIVENS CAPSULE STUDY N/A 05/27/2016   Procedure: GIVENS CAPSULE STUDY;  Surgeon: Carol Ada, MD;  Location: [place];  Service: Endoscopy;  Laterality: N/A;  . NECK SURGERY  1991  . TRANSTHORACIC ECHOCARDIOGRAM  05/25/2016   EF 60%, normal wall motion, grd II DD, mild aortic stenosis, dilated aortic root and ascending aorta.  [name] VASECTOMY  1977    Allergies  Allergen Reactions  . Statins Other (See Comments)    Liver enzymes go up whenever on them    Current Outpatient Prescriptions  Medication Sig Dispense Refill  . cholecalciferol (VITAMIN D) 1000 units tablet Take 1,000 Units by mouth daily.    . fluticasone (  FLONASE) 50 MCG/ACT nasal spray Place 2 sprays into both nostrils daily. (Patient taking differently: Place 2 sprays into both nostrils daily as needed for allergies. ) 16 g 6  . iron polysaccharides (NIFEREX) 150 MG capsule Take 1 capsule (150 mg total) by mouth daily. 30 capsule 6  . metoprolol tartrate (LOPRESSOR) 25 MG tablet Take 0.5 tablets (12.5 mg total) by mouth 2 (two) times daily. 60 tablet 6  . omega-3 acid ethyl esters (LOVAZA) 1 g capsule TAKE 2 CAPSULES BY MOUTH ONCE DAILY (Patient taking differently: TAKE 1 CAPSULES BY MOUTH TWICE DAILY) 180 capsule 3  . pantoprazole (PROTONIX) 40 MG tablet Take 1 tablet (40 mg total) by mouth 2 (two) times daily before a meal. 6 tablet 0  . potassium chloride SA (K-DUR,KLOR-CON) 20 MEQ tablet Take 1 tablet (20 mEq total) by mouth 2 (two) times daily. 60 tablet 6  . spironolactone (ALDACTONE) 25 MG tablet Take 1 tablet (25 mg total) by mouth daily. 30 tablet 6  . torsemide (DEMADEX) 20 MG tablet Take 2 tablets (40 mg total) by mouth daily. 60 tablet 6   No current facility-administered medications for this visit.     Socially he's married has 3 children 3 grandchildren. Does  not routinely exercise. In the past he had been walking 4 miles on a treadmill but has not done this in some time. There is no tobacco or alcohol use.  ROS General: Negative; No fevers, chills, or night sweats;  HEENT: Negative; No changes in vision or hearing, sinus congestion, difficulty swallowing Pulmonary: Negative; No cough, wheezing, shortness of breath, hemoptysis Cardiovascular: Negative; No chest pain, presyncope, syncope, palpitations Positive for Increasing leg swelling GI: Positive for polyps on colonoscopy; positive for fatty liver; recent GI bleeding and symptomatic anemia GU: Negative; No dysuria, hematuria, or difficulty voiding Musculoskeletal: Negative; no myalgias, joint pain, or weakness Hematologic/Oncology: Negative; no easy bruising, bleeding Endocrine: Positive for metabolic syndrome/mild diabetes; no heat/cold intolerance; Neuro: Negative; no changes in balance, headaches Skin: Negative; No rashes or skin lesions Psychiatric: Negative; No behavioral problems, depression Sleep: Negative; No snoring, daytime sleepiness, hypersomnolence, bruxism, restless legs, hypnogognic hallucinations, no cataplexy Other comprehensive 14 point system review is negative.   PE BP (!) 122/52   Pulse 74   Ht 5' 8.5" (1.74 m)   Wt 245 lb 12.8 oz (111.5 kg)   BMI 36.83 kg/m    Repeat blood pressure by me 120/60  Wt Readings from Last 3 Encounters:  09/12/16 245 lb 12.8 oz (111.5 kg)  09/03/16 236 lb 15.9 oz (107.5 kg)  08/30/16 250 lb 4.8 oz (113.5 kg)     Physical Exam BP (!) 122/52   Pulse 74   Ht 5' 8.5" (1.74 m)   Wt 245 lb 12.8 oz (111.5 kg)   BMI 36.83 kg/m  General: Alert, oriented, no distress.  Skin: normal turgor, no rashes, warm and dry HEENT: Normocephalic, atraumatic. Pupils equal round and reactive to light; sclera anicteric; extraocular muscles intact;  Nose without nasal septal hypertrophy Mouth/Parynx benign; Mallinpatti scale 3 Neck: No JVD, no  carotid bruits; normal carotid upstroke Lungs: clear to ausculatation and percussion; no wheezing or rales Chest wall: without tenderness to palpitation Heart: PMI not displaced, RRR, s1 s2 normal, 1/6 systolic murmur, no diastolic murmur, no rubs, gallops, thrills, or heaves Abdomen: soft, nontender; no hepatosplenomehaly, BS+; abdominal aorta nontender and not dilated by palpation. Back: no CVA tenderness Pulses 2+ Musculoskeletal: full range of motion, normal strength, no joint deformities Extremities:  no clubbing cyanosis or edema, Homan's sign negative  Neurologic: grossly nonfocal; Cranial nerves grossly wnl Psychologic: Normal mood and affect   ECG (independently read by me): Normal sinus rhythm at 74 bpm.  Incomplete right bundle branch block.  First-degree AV block with a PR interval 28 ms.  QRS complex V1 V2.  QTc interval 499 ms.  March 2018 ECG (independently read by me): Normal sinus rhythm at 61 bpm.  First degree AV block with a PR interval at 224 ms.  Incomplete right bundle branch block.  December 2017 ECG (independently read by me): Normal sinus rhythm at 63 bpm with first-degree AV block.  PR interval 222 ms.  Incomplete right bundle branch block.  June 2017 ECG (independently read by me): Normal sinus rhythm at 65 bpm with first-degree AV block with a PR interval of 214 ms.  Mild RV conduction delay.  Poor anterior R-wave progression V1 through V4.  December 2016 ECG (independently read by me): Normal sinus rhythm at 66 bpm.  First-degree AV block with a PR interval at 224 ms.  June 2016 ECG (independently read by me): Normal sinus rhythm at 66. First-degree block with PR interval 28 ms.  Incomplete right bundle branch block.  QRS complex V1-3.   October 2015 ECG (independently read by me): Sinus rhythm with first-degree AV block.  Incomplete right bundle branch block.  QTc interval 447 ms; first-degree AV block with a PR interval of 220 ms  03/19/2013 ECG  (independently read by me): Normal sinus rhythm at 69 beats per minute. No ectopy. Borderline first-degree block with a PR interval of 216 ms.  Prior ECG of July 18,2014:  Sinus rhythm at 57 beats per minute. Borderline first-degree block with a period of 1214 ms. No significant ST abnormalities.  LABS: BMP Latest Ref Rng & Units 08/31/2016 08/30/2016 08/15/2016  Glucose 65 - 99 mg/dL 118(H) 119(H) 119(H)  BUN 6 - 20 mg/dL 18 15 13   Creatinine 0.61 - 1.24 mg/dL 1.05 1.02 1.04  BUN/Creat Ratio 10 - 24 - - -  Sodium 135 - 145 mmol/L 137 134(L) 135  Potassium 3.5 - 5.1 mmol/L 4.2 4.0 4.2  Chloride 101 - 111 mmol/L 105 102 101  CO2 22 - 32 mmol/L 24 23 26   Calcium 8.9 - 10.3 mg/dL 8.6(L) 8.6(L) 8.8   Hepatic Function Latest Ref Rng & Units 08/31/2016 08/30/2016 08/15/2016  Total Protein 6.5 - 8.1 g/dL 6.1(L) 6.1(L) 6.6  Albumin 3.5 - 5.0 g/dL 2.6(L) 2.6(L) 3.2(L)  AST 15 - 41 U/L 41 41 35  ALT 17 - 63 U/L 28 28 25   Alk Phosphatase 38 - 126 U/L 75 77 81  Total Bilirubin 0.3 - 1.2 mg/dL 5.4(H) 3.7(H) 3.9(H)  Bilirubin, Direct 0.1 - 0.5 mg/dL - - -   CBC Latest Ref Rng & Units 09/03/2016 09/02/2016 09/01/2016  WBC 4.0 - 10.5 K/uL 5.9 5.6 5.7  Hemoglobin 13.0 - 17.0 g/dL 8.6(L) 7.5(L) 7.6(L)  Hematocrit 39.0 - 52.0 % 26.3(L) 23.1(L) 23.1(L)  Platelets 150 - 400 K/uL 97(L) 103(L) 96(L)   Lab Results  Component Value Date   MCV 84.0 09/03/2016   MCV 84.6 09/02/2016   MCV 84.3 09/01/2016   Lab Results  Component Value Date   TSH 1.19 05/04/2016   Lab Results  Component Value Date   HGBA1C 5.4 06/29/2016   Lipid Panel     Component Value Date/Time   CHOL 204 (H) 04/01/2016 0952   CHOL 135 11/29/2013 0940   TRIG 119.0  04/01/2016 0952   TRIG 177 (H) 11/29/2013 0940   HDL 29.70 (L) 04/01/2016 0952   HDL 34 (L) 11/29/2013 0940   CHOLHDL 7 04/01/2016 0952   VLDL 23.8 04/01/2016 0952   LDLCALC 151 (H) 04/01/2016 0952   LDLCALC 66 11/29/2013 0940     RADIOLOGY: No results  found.  IMPRESSION:  1. Chronic diastolic heart failure ([place])   2. Essential hypertension   3. Coronary artery disease involving native coronary artery of native heart without angina pectoris   4. Hx of CABG   5. Hyperlipidemia with target LDL less than 70   6. History of GI bleed   7. Aortic stenosis, mild   8. Abdominal aortic aneurysm without rupture [facility])     ASSESSMENT AND PLAN: William [name] is a 66 year old gentleman with a history of moderate obesity, who is 10 1/2  years status post CABG revascularization surgery x4 in February 2008 . His nuclear perfusion study in December 2013 showed fairly normal perfusion without scar or ischemia And a five-year follow-up study in March 2018 continued to be low risk demonstrating normal perfusion and function.. He has significant mixed hyperlipidemia and in the past has been documented to have a preponderance of small dense LDL particles and significantly increased triglycerides. Remotely he initially tolerated combination therapy with Niaspan 2000 mg and Crestor 20 mg without side effects. He developed  LFT elevation and his statin and niacin were discontinued.   Subsequent LFT studies have shown improvement but still with residual mild  transaminase elevation.  He also has had issues with LFT elevation with pravastatin and Lipitor in addition to Crestor.  His most recent lipid studies.  If every 2018 were significantly elevated with a total cluster of 204 and LDL cholesterol 151, which was improved from December 2016 when his LDL was 184.  I have recommended we initiate the process for PCS canine inhibition with path of.  Sundra Aland will see the patient today to initiate the approval process for this human monoclonal antibody in light of his inability to take statins due to LFT elevation.  He has had issues with recent recurrent GI bleed with difficulty obtaining the source.  He has now been referred by his gastroneurologist in [place] to see Dr.  [name] at [facility] and he states to be seeing them in the very near future.  He also has a known abdominal aortic aneurysm.  I reviewed his most recent echo Doppler findings.  I will see him in 4 months for reevaluation. Time spent: 40 minutes   Troy Sine, MD, [facility]  09/13/2016 3:14 PM

## 2016-09-13 ENCOUNTER — Other Ambulatory Visit: Payer: Self-pay | Admitting: *Deleted

## 2016-09-13 DIAGNOSIS — K7460 Unspecified cirrhosis of liver: Secondary | ICD-10-CM | POA: Diagnosis not present

## 2016-09-13 DIAGNOSIS — D509 Iron deficiency anemia, unspecified: Secondary | ICD-10-CM | POA: Diagnosis not present

## 2016-09-13 NOTE — Patient Outreach (Signed)
[place] [facility]) Care Management  09/13/2016  William [name] 08-03-1950 [id number]   Subjective: Telephone call to patient's home  number, no answer, left HIPAA compliant voicemail message, and requested call back.  Objective:Per chart review, patient hospitalized  08/30/16 - 09/05/16 for anemia.  Patient hospitalized 07/29/16 - 08/01/16, and 05/22/16 - 05/29/16 for gastrointestinal bleed. Patient also has a history of Acute blood loss anemia, Hyperbilirubinemia, Liver Cirrhoses/NAFLD (nonalcoholic fatty liver disease), Acute on chronic diastolic heart failure, CAD, hypertension, diabetes, mixed hyperlipidemia, and status post CABG in 2008.  Transition of care follow up completed by primary MD's office on 09/05/16.  Most recent [place] Management transition of care follow up completed on 08/15/16.    Assessment:Received [facility] Transition of care referral on 09/01/16. Transition of care follow up pending patient contact.    Plan: RNCM will call patient for 3rd telephone outreach attempt, transition of care follow up, and verify if any [place] Benefit access needs, within 10 business days if no return call.     Samule Life H. Annia Friendly, BSN, [facility] [facility] Telephonic CM Phone: [phone] Fax: [phone]

## 2016-09-14 ENCOUNTER — Other Ambulatory Visit: Payer: Self-pay | Admitting: *Deleted

## 2016-09-14 ENCOUNTER — Encounter: Payer: Self-pay | Admitting: *Deleted

## 2016-09-14 NOTE — Patient Outreach (Signed)
[facility] [facility]) Care Management  09/14/2016  William [name] 1950-04-30 [id number]   Subjective: Telephone call from patient's wife work number, spoke with patient's wife [name]), she stated patient's name, date of birth, and address.  Per previous conversation, RNCM received verbal authorization from patient to discuss patent's healthcare needs with patient's wife as needed.  Discussed [name] Care Management [facility] Transition of care follow up, patient voiced understanding, and is in agreement to follow up.   Wife states she remember speaking with this RNCM from previous conversations.  Wife states she is returning call for patient.  States patient is doing much better, hemoglobin is stable at 8.7 per gastroenterologist, and patient has been referred to gastroenterologist (Dr. [name]) at [facility] to obtain a spiral EGD on 10/12/16.  States the spiral EGD is a special EGD to address why patient is having so many bleeds. Wife states patient has the following scheduled follow up appointments: cardiologist on 09/12/16, Dr. [name] on 09/13/16, primary MD on 09/16/16, and hematologist on 09/27/16.   States patient is accessing the following [facility] benefits: outpatient pharmacy, hospital indemnity, and family medical leave act [facility]) is in place. Wife states patient does not have any education material, transition of care, care coordination, disease management, disease monitoring, transportation, community resource, or pharmacy needs at this time. States she is very appreciative of the follow up and is in agreement to receive [place] Management information on patient's behalf.  Objective:Per chart review, patient hospitalized 08/30/16 - 09/05/16 for anemia. Patient hospitalized 07/29/16 - 08/01/16, and 05/22/16 - 05/29/16 for gastrointestinal bleed. Patient also has a history of Acute blood loss anemia, Hyperbilirubinemia, Liver Cirrhoses/NAFLD (nonalcoholic fatty liver disease), Acute on chronic diastolic heart failure,  CAD, hypertension, diabetes, mixed hyperlipidemia, and status post CABG in 2008.  Transition of care follow up completed by primary MD's office on 09/05/16. Most recent [place] Management transition of care follow up completed on 08/15/16.    Assessment:Received [facility] Transition of care referral on 09/01/16. Transition of care follow up completed, no care management needs, and will proceed with case closure.   Plan: RNCM will send patient successful outreach letter, [facility] pamphlet, and magnet. RNCM will send case closure due to follow up completed / no care management needs request to [facility] [place] [facility].      [name] Friendly, BSN, [place] Management [facility] Telephonic CM Phone: [phone] Fax: [phone]

## 2016-09-15 ENCOUNTER — Encounter: Payer: Self-pay | Admitting: Family Medicine

## 2016-09-16 ENCOUNTER — Ambulatory Visit: Payer: 59 | Admitting: Family Medicine

## 2016-09-19 ENCOUNTER — Encounter: Payer: Self-pay | Admitting: Family Medicine

## 2016-09-19 ENCOUNTER — Ambulatory Visit (INDEPENDENT_AMBULATORY_CARE_PROVIDER_SITE_OTHER): Payer: 59 | Admitting: Family Medicine

## 2016-09-19 VITALS — BP 119/69 | HR 95 | Temp 98.4°F | Resp 16 | Ht 68.5 in | Wt 244.5 lb

## 2016-09-19 DIAGNOSIS — K922 Gastrointestinal hemorrhage, unspecified: Secondary | ICD-10-CM | POA: Diagnosis not present

## 2016-09-19 DIAGNOSIS — D500 Iron deficiency anemia secondary to blood loss (chronic): Secondary | ICD-10-CM

## 2016-09-19 NOTE — Progress Notes (Signed)
09/19/2016  CC:  Chief Complaint  Patient presents with  . Hospitalization Follow-up    TCM   Patient is a 66 y.o. Caucasian male who presents for  hospital follow up, specifically [facility] face-to-face visit. Dates hospitalized: 7/24-7/30, 2018. Days since d/c from hospital: 14 Patient was discharged from hospital to home. Reason for admission to hospital: symptomatic anemia, required transfusion and further inpatient evaluation. Date of interactive (phone) contact with patient and/or caregiver: 09/05/16  I have reviewed patient's discharge summary plus pertinent specific notes, labs, and imaging from the hospitalization.   This has been a recurrent problem the last few months: has required 10 U pRBCs during 3 separate hospitalizations. Has had extensive GI investigation during this time, including colonoscopy, EGD, capsule endoscopy, small bowel enteroscopy which was unrevealing by report. D/c hemoglobin was 8.6.  He had local GI f/u Dr. [name] about 1 week after d/c and pt states he was told Hb "went up slightly".  He was not that tired at that time. He was to see GI specialist, Dr. [name],  at [facility] for further GI evaluation and possible procedures on 10/12/16.  Medication reconciliation was done today and patient is taking meds as recommended by discharging hospitalist/specialist.   Has been feeling weak the last 2 d.  His BMs are still dark intermittently, sometimes normal brown.  No sticky/tar consistency.  Taking iron. No SOB.  He eats and drinks well---water. No abdominal pain.     PMH:  Past Medical History:  Diagnosis Date  . AAA (abdominal aortic aneurysm) ([place]) 03/2014   3.2 cm on MR abd.  F/u aortic u/s 06/2014 showed 3.0 x 3.1 cm AAA: recheck 2 yrs recommended (followed by cardiologist)  . Bilateral renal cysts    simple (03/2014 MRI)  . CAD (coronary artery disease)   . Cholelithiases 2018   asymptomatic  . Chronic diastolic heart failure ([place])   .  Cirrhosis ([place]) 05/2016   secondary to NASH; most recent u/s abd 07/2016 showed splenomegaly.  Hx of portal HTN changes with mild ascites and splenomegaly.  . Diabetes mellitus with complication ([place]) 40/9811   A1c 6.8%  . Hyperlipemia, mixed    elevated LFTs when on statins.    . Hypertension    Cr bump 04/01/16 so I changed benicar-hct to benicar plain and added amlodipine 5 mg.  . Iron deficiency anemia 05/2016   Acute blood loss anemia: hospitalized, required transfusion x 3 U: colonoscopy and capsule study unrevealing.  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  W/u for ? hemolytic anemia to be pursued by hematologist as outpt.  Appt with Dr. Cristi Loron at [facility] 09/13/16.  . Microscopic hematuria    Eval unremarkable by Dr. Eulogio Ditch.  [name] NASH (nonalcoholic steatohepatitis)    Fatty liver on MR abd 03/2014, + hx of elevated transaminases and bili: followed by Dr. [name].  . Obesity   . Spleen enlarged     PSH:  Past Surgical History:  Procedure Laterality Date  . CARDIAC CATHETERIZATION    . CARDIOVASCULAR STRESS TEST  01/17/2012; 05/04/16   Normal stress nuclear study x 2 (2018--Normal perfusion. LVEF 66% with normal wall motion. This is a low risk study).  . CERVICAL DISCECTOMY  1992  . COLONOSCOPY N/A 05/27/2016   No site/explanation for blood loss found.  Erythematous mucosa in cecum and ascending colon--Cecal bx: normal.  Procedure: COLONOSCOPY;  Surgeon: Carol Ada,  MD;  Location: [facility] ENDOSCOPY;  Service: Endoscopy;  Laterality: N/A;  . COLONOSCOPY W/ POLYPECTOMY  09/10/2014   Polypectomy x 2: recall 5 yrs (Dr. [name]).  . CORONARY ANGIOPLASTY    . CORONARY ARTERY BYPASS GRAFT  03/2006  . ENTEROSCOPY N/A 07/31/2016   Procedure: ENTEROSCOPY;  Surgeon: Ladene Artist, MD;  Location: [facility] ENDOSCOPY;  Service: Endoscopy;  Laterality: N/A;  . ESOPHAGOGASTRODUODENOSCOPY N/A 05/25/2016   No  site/explanation for blood loss found.  Procedure: ESOPHAGOGASTRODUODENOSCOPY (EGD);  Surgeon: Juanita Craver, MD;  Location: [facility] ENDOSCOPY;  Service: Endoscopy;  Laterality: N/A;  . GIVENS CAPSULE STUDY N/A 05/27/2016   Procedure: GIVENS CAPSULE STUDY;  Surgeon: Carol Ada, MD;  Location: [place];  Service: Endoscopy;  Laterality: N/A;  . NECK SURGERY  1991  . TRANSTHORACIC ECHOCARDIOGRAM  05/25/2016   EF 60%, normal wall motion, grd II DD, mild aortic stenosis, dilated aortic root and ascending aorta.  [name] VASECTOMY  1977    MEDS:  Outpatient Medications Prior to Visit  Medication Sig Dispense Refill  . cholecalciferol (VITAMIN D) 1000 units tablet Take 1,000 Units by mouth daily.    [name] docusate sodium (COLACE) 100 MG capsule Take 100 mg by mouth 2 (two) times daily as needed for mild constipation (1 - 2 tabs as needed).    . fluticasone (FLONASE) 50 MCG/ACT nasal spray Place 2 sprays into both nostrils daily. (Patient taking differently: Place 2 sprays into both nostrils daily as needed for allergies. ) 16 g 6  . iron polysaccharides (NIFEREX) 150 MG capsule Take 1 capsule (150 mg total) by mouth daily. 30 capsule 6  . metoprolol tartrate (LOPRESSOR) 25 MG tablet Take 0.5 tablets (12.5 mg total) by mouth 2 (two) times daily. 60 tablet 6  . omega-3 acid ethyl esters (LOVAZA) 1 g capsule TAKE 2 CAPSULES BY MOUTH ONCE DAILY (Patient taking differently: TAKE 1 CAPSULES BY MOUTH TWICE DAILY) 180 capsule 3  . pantoprazole (PROTONIX) 40 MG tablet Take 1 tablet (40 mg total) by mouth 2 (two) times daily before a meal. 6 tablet 0  . potassium chloride SA (K-DUR,KLOR-CON) 20 MEQ tablet Take 1 tablet (20 mEq total) by mouth 2 (two) times daily. 60 tablet 6  . spironolactone (ALDACTONE) 25 MG tablet Take 1 tablet (25 mg total) by mouth daily. 30 tablet 6  . torsemide (DEMADEX) 20 MG tablet Take 2 tablets (40 mg total) by mouth daily. 60 tablet 6   No facility-administered medications prior to visit.     EXAM: BP 119/69 (BP Location: Left Arm, Patient Position: Sitting, Cuff Size: Normal)   Pulse 95   Temp 98.4 F (36.9 C) (Oral)   Resp 16   Ht 5' 8.5" (1.74 m)   Wt 244 lb 8 oz (110.9 kg)   SpO2 98%   BMI 36.64 kg/m  Gen: Alert, well appearing.  Patient is oriented to person, place, time, and situation. AFFECT: pleasant, lucid thought and speech. No significant pallor.  No jaundice. CV: RRR, 2-4/2 systolic, blowing murmur.  No rub or gallop. Chest is clear, no wheezing or rales. Normal symmetric air entry throughout both lung fields. No chest wall deformities or tenderness. ABD: soft, NT, ND, BS normal.  No hepatospenomegaly or mass.  No bruits. EXT: no clubbing, cyanosis, or edema.   Pertinent labs/imaging  Hemoccult positive today in exam room  Lab Results  Component Value Date   TSH 1.19 05/04/2016   Lab Results  Component Value Date   WBC 5.9 09/03/2016  HGB 8.6 (L) 09/03/2016   HCT 26.3 (L) 09/03/2016   MCV 84.0 09/03/2016   PLT 97 (L) 09/03/2016   Lab Results  Component Value Date   CREATININE 1.05 08/31/2016   BUN 18 08/31/2016   NA 137 08/31/2016   K 4.2 08/31/2016   CL 105 08/31/2016   CO2 24 08/31/2016   Lab Results  Component Value Date   ALT 28 08/31/2016   AST 41 08/31/2016   ALKPHOS 75 08/31/2016   BILITOT 5.4 (H) 08/31/2016   Lab Results  Component Value Date   CHOL 204 (H) 04/01/2016   Lab Results  Component Value Date   HDL 29.70 (L) 04/01/2016   Lab Results  Component Value Date   LDLCALC 151 (H) 04/01/2016   Lab Results  Component Value Date   TRIG 119.0 04/01/2016   Lab Results  Component Value Date   CHOLHDL 7 04/01/2016   Lab Results  Component Value Date   PSA 0.31 06/29/2016   Lab Results  Component Value Date   HGBA1C 5.4 06/29/2016    ASSESSMENT/PLAN:  1) Chronic GI bleeding; recurrent transfusions required due to symptomatic anemia. Unknown site of blood loss---has appt with specialist at [facility] in about 3  wks to get further endoscopy into small bowel, which is where his GI MD thinks the bleeding is coming from. Recheck CBC today.  If dropping, will set up outpt transfusion as long as pt's fatigue is not progressing too much. He'll continue oral iron for now.  Medical decision making of moderate complexity was utilized today.  An After Visit Summary was printed and given to the patient.  FOLLOW UP:  Keep appt already set for 09/29/16 with me--f/u anemia/chronic GI bleed.  Signed:  Crissie Sickles, MD           09/19/2016

## 2016-09-20 LAB — HEMOCCULT GUIAC POC 1CARD (OFFICE): Fecal Occult Blood, POC: POSITIVE — AB

## 2016-09-21 ENCOUNTER — Encounter (HOSPITAL_COMMUNITY): Payer: Self-pay | Admitting: *Deleted

## 2016-09-21 ENCOUNTER — Observation Stay (HOSPITAL_COMMUNITY)
Admission: EM | Admit: 2016-09-21 | Discharge: 2016-09-23 | Disposition: A | Discharge status: Home / Self Care | Payer: 59 | Attending: Internal Medicine | Admitting: Internal Medicine

## 2016-09-21 DIAGNOSIS — N289 Disorder of kidney and ureter, unspecified: Secondary | ICD-10-CM | POA: Insufficient documentation

## 2016-09-21 DIAGNOSIS — I110 Hypertensive heart disease with heart failure: Secondary | ICD-10-CM | POA: Diagnosis not present

## 2016-09-21 DIAGNOSIS — I8510 Secondary esophageal varices without bleeding: Secondary | ICD-10-CM | POA: Diagnosis not present

## 2016-09-21 DIAGNOSIS — K7460 Unspecified cirrhosis of liver: Secondary | ICD-10-CM | POA: Diagnosis present

## 2016-09-21 DIAGNOSIS — K3189 Other diseases of stomach and duodenum: Secondary | ICD-10-CM | POA: Diagnosis not present

## 2016-09-21 DIAGNOSIS — I714 Abdominal aortic aneurysm, without rupture: Secondary | ICD-10-CM | POA: Diagnosis not present

## 2016-09-21 DIAGNOSIS — E669 Obesity, unspecified: Secondary | ICD-10-CM | POA: Diagnosis not present

## 2016-09-21 DIAGNOSIS — Z951 Presence of aortocoronary bypass graft: Secondary | ICD-10-CM

## 2016-09-21 DIAGNOSIS — N281 Cyst of kidney, acquired: Secondary | ICD-10-CM | POA: Insufficient documentation

## 2016-09-21 DIAGNOSIS — K759 Inflammatory liver disease, unspecified: Secondary | ICD-10-CM | POA: Diagnosis not present

## 2016-09-21 DIAGNOSIS — K7581 Nonalcoholic steatohepatitis (NASH): Secondary | ICD-10-CM | POA: Diagnosis not present

## 2016-09-21 DIAGNOSIS — D649 Anemia, unspecified: Secondary | ICD-10-CM | POA: Diagnosis not present

## 2016-09-21 DIAGNOSIS — I10 Essential (primary) hypertension: Secondary | ICD-10-CM | POA: Diagnosis not present

## 2016-09-21 DIAGNOSIS — I2510 Atherosclerotic heart disease of native coronary artery without angina pectoris: Secondary | ICD-10-CM | POA: Diagnosis not present

## 2016-09-21 DIAGNOSIS — D500 Iron deficiency anemia secondary to blood loss (chronic): Principal | ICD-10-CM | POA: Diagnosis present

## 2016-09-21 DIAGNOSIS — Z79899 Other long term (current) drug therapy: Secondary | ICD-10-CM | POA: Insufficient documentation

## 2016-09-21 DIAGNOSIS — E785 Hyperlipidemia, unspecified: Secondary | ICD-10-CM | POA: Diagnosis not present

## 2016-09-21 DIAGNOSIS — R531 Weakness: Secondary | ICD-10-CM

## 2016-09-21 DIAGNOSIS — R3129 Other microscopic hematuria: Secondary | ICD-10-CM | POA: Diagnosis not present

## 2016-09-21 DIAGNOSIS — E119 Type 2 diabetes mellitus without complications: Secondary | ICD-10-CM | POA: Diagnosis not present

## 2016-09-21 DIAGNOSIS — Z8719 Personal history of other diseases of the digestive system: Secondary | ICD-10-CM

## 2016-09-21 DIAGNOSIS — K921 Melena: Secondary | ICD-10-CM | POA: Insufficient documentation

## 2016-09-21 DIAGNOSIS — I5032 Chronic diastolic (congestive) heart failure: Secondary | ICD-10-CM | POA: Diagnosis present

## 2016-09-21 DIAGNOSIS — I739 Peripheral vascular disease, unspecified: Secondary | ICD-10-CM | POA: Insufficient documentation

## 2016-09-21 DIAGNOSIS — Z888 Allergy status to other drugs, medicaments and biological substances status: Secondary | ICD-10-CM | POA: Diagnosis not present

## 2016-09-21 DIAGNOSIS — K7469 Other cirrhosis of liver: Secondary | ICD-10-CM | POA: Diagnosis not present

## 2016-09-21 DIAGNOSIS — I5033 Acute on chronic diastolic (congestive) heart failure: Secondary | ICD-10-CM | POA: Diagnosis present

## 2016-09-21 DIAGNOSIS — K766 Portal hypertension: Secondary | ICD-10-CM | POA: Insufficient documentation

## 2016-09-21 DIAGNOSIS — D696 Thrombocytopenia, unspecified: Secondary | ICD-10-CM | POA: Diagnosis not present

## 2016-09-21 DIAGNOSIS — E1165 Type 2 diabetes mellitus with hyperglycemia: Secondary | ICD-10-CM | POA: Diagnosis not present

## 2016-09-21 DIAGNOSIS — Z87891 Personal history of nicotine dependence: Secondary | ICD-10-CM | POA: Diagnosis not present

## 2016-09-21 HISTORY — DX: Personal history of other medical treatment: Z92.89

## 2016-09-21 LAB — COMPREHENSIVE METABOLIC PANEL
ALT: 31 U/L (ref 17–63)
AST: 38 U/L (ref 15–41)
Albumin: 2.7 g/dL — ABNORMAL LOW (ref 3.5–5.0)
Alkaline Phosphatase: 70 U/L (ref 38–126)
Anion gap: 7 (ref 5–15)
BUN: 13 mg/dL (ref 6–20)
CO2: 23 mmol/L (ref 22–32)
Calcium: 8.4 mg/dL — ABNORMAL LOW (ref 8.9–10.3)
Chloride: 102 mmol/L (ref 101–111)
Creatinine, Ser: 1.24 mg/dL (ref 0.61–1.24)
GFR calc Af Amer: >60 mL/min (ref >60)
GFR calc non Af Amer: 58 mL/min — ABNORMAL LOW (ref >60)
Glucose, Bld: 187 mg/dL — ABNORMAL HIGH (ref 65–99)
Potassium: 3.8 mmol/L (ref 3.5–5.1)
Sodium: 132 mmol/L — ABNORMAL LOW (ref 135–145)
Total Bilirubin: 3.4 mg/dL — ABNORMAL HIGH (ref 0.3–1.2)
Total Protein: 6.2 g/dL — ABNORMAL LOW (ref 6.5–8.1)

## 2016-09-21 LAB — CBC
HCT: 19.7 % — ABNORMAL LOW (ref 39.0–52.0)
HEMATOCRIT: 23.4 % — AB (ref 39.0–52.0)
Hemoglobin: 6.1 g/dL — CL (ref 13.0–17.0)
Hemoglobin: 7.5 g/dL — ABNORMAL LOW (ref 13.0–17.0)
MCH: 25.1 pg — ABNORMAL LOW (ref 26.0–34.0)
MCH: 26.5 pg (ref 26.0–34.0)
MCHC: 31.6 g/dL (ref 30.0–36.0)
MCHC: 31 g/dL (ref 30.0–36.0)
MCHC: 32.1 g/dL (ref 30.0–36.0)
MCV: 82.8 fl (ref 78.0–100.0)
MCV: 81.1 fL (ref 78.0–100.0)
MCV: 82.7 fL (ref 78.0–100.0)
PLATELETS: 174 10*3/uL (ref 150.0–400.0)
Platelets: 155 10*3/uL (ref 150–400)
Platelets: 142 10*3/uL — ABNORMAL LOW (ref 150–400)
RBC: 2.43 MIL/uL — ABNORMAL LOW (ref 4.22–5.81)
RBC: 2.83 MIL/uL — AB (ref 4.22–5.81)
RDW: 16.3 % — ABNORMAL HIGH (ref 11.5–15.5)
RDW: 15.5 % (ref 11.5–15.5)
RDW: 15.3 % (ref 11.5–15.5)
WBC: 6.7 10*3/uL (ref 4.0–10.5)
WBC: 6 10*3/uL (ref 4.0–10.5)
WBC: 5 10*3/uL (ref 4.0–10.5)

## 2016-09-21 LAB — GLUCOSE, CAPILLARY
GLUCOSE-CAPILLARY: 82 mg/dL (ref 65–99)
Glucose-Capillary: 161 mg/dL — ABNORMAL HIGH (ref 65–99)

## 2016-09-21 LAB — PREPARE RBC (CROSSMATCH)

## 2016-09-21 MED ORDER — TORSEMIDE 20 MG PO TABS
40.0000 mg | ORAL_TABLET | Freq: Every day | ORAL | Status: DC
  Administered 2016-09-22 – 2016-09-23 (×2): 40 mg via ORAL
  Filled 2016-09-21 (×2): qty 2

## 2016-09-21 MED ORDER — ONDANSETRON HCL 4 MG PO TABS: 4.0000 mg | ORAL_TABLET | Freq: Four times a day (QID) | ORAL | Status: DC | PRN

## 2016-09-21 MED ORDER — INSULIN ASPART 100 UNIT/ML ~~LOC~~ SOLN
0.0000 [IU] | Freq: Every day | SUBCUTANEOUS | Status: DC
Start: 2016-09-21 — End: 2016-09-23

## 2016-09-21 MED ORDER — METOPROLOL TARTRATE 12.5 MG HALF TABLET
12.5000 mg | ORAL_TABLET | Freq: Two times a day (BID) | ORAL | Status: DC
  Administered 2016-09-21 – 2016-09-23 (×4): 12.5 mg via ORAL
  Filled 2016-09-21 (×5): qty 1

## 2016-09-21 MED ORDER — INSULIN ASPART 100 UNIT/ML ~~LOC~~ SOLN: 0.0000 [IU] | Freq: Three times a day (TID) | SUBCUTANEOUS | Status: DC

## 2016-09-21 MED ORDER — VITAMIN D 1000 UNITS PO TABS
1000.0000 [IU] | ORAL_TABLET | Freq: Every day | ORAL | Status: DC
  Administered 2016-09-22 – 2016-09-23 (×2): 1000 [IU] via ORAL
  Filled 2016-09-21 (×2): qty 1

## 2016-09-21 MED ORDER — PANTOPRAZOLE SODIUM 40 MG PO TBEC
40.0000 mg | DELAYED_RELEASE_TABLET | Freq: Two times a day (BID) | ORAL | Status: DC
  Administered 2016-09-21 – 2016-09-23 (×4): 40 mg via ORAL
  Filled 2016-09-21 (×4): qty 1

## 2016-09-21 MED ORDER — SPIRONOLACTONE 25 MG PO TABS
25.0000 mg | ORAL_TABLET | Freq: Every day | ORAL | Status: DC
  Administered 2016-09-22 – 2016-09-23 (×2): 25 mg via ORAL
  Filled 2016-09-21 (×2): qty 1

## 2016-09-21 MED ORDER — DOCUSATE SODIUM 100 MG PO CAPS: 100.0000 mg | ORAL_CAPSULE | Freq: Two times a day (BID) | ORAL | Status: DC | PRN

## 2016-09-21 MED ORDER — SODIUM CHLORIDE 0.9 % IV SOLN: Freq: Once | INTRAVENOUS | Status: DC

## 2016-09-21 MED ORDER — ONDANSETRON HCL 4 MG/2ML IJ SOLN: 4.0000 mg | Freq: Four times a day (QID) | INTRAMUSCULAR | Status: DC | PRN

## 2016-09-21 MED ORDER — POTASSIUM CHLORIDE CRYS ER 20 MEQ PO TBCR
20.0000 meq | EXTENDED_RELEASE_TABLET | Freq: Two times a day (BID) | ORAL | Status: DC
  Administered 2016-09-21 – 2016-09-23 (×4): 20 meq via ORAL
  Filled 2016-09-21 (×4): qty 1

## 2016-09-21 MED ORDER — POLYSACCHARIDE IRON COMPLEX 150 MG PO CAPS
150.0000 mg | ORAL_CAPSULE | Freq: Every day | ORAL | Status: DC
  Administered 2016-09-22 – 2016-09-23 (×2): 150 mg via ORAL
  Filled 2016-09-21 (×2): qty 1

## 2016-09-21 NOTE — ED Triage Notes (Signed)
Pt sent here by his pcp for hemoglobin of 6.6.  Blood was drawn 2 days ago as routinge hgb check.  Pt has had 12 blood transfusions since April with no known cause of anemia.

## 2016-09-21 NOTE — ED Notes (Signed)
Patient present to ed states he was seen by his on Monday  For routine blood check, was called today with low hgb level , Patient was in [place] the end of July for same. States he has been having dark tarry stools off and on since he was discharged from the hospital . Wife states patient has received 12 U of Blood since April. Has has extensive  GI work ups to find blooding source  However hasn't yet. Denies pain . C/o feeling weak and shaky at times.

## 2016-09-21 NOTE — Progress Notes (Signed)
Received report from Hassan Rowan, [place] in ED

## 2016-09-21 NOTE — ED Notes (Signed)
Resting on stretcher no complaints at present , Lunch tray ordered.

## 2016-09-21 NOTE — ED Provider Notes (Signed)
[facility] DEPT Provider Note   CSN: [id number] Arrival date & time: 09/21/16  9485   By signing my name below, I, William [name], attest that this documentation has been prepared under the direction and in the presence of Virgel Manifold, MD. Electronically signed, William [name], ED Scribe. 09/21/16. 11:36 AM.   History   Chief Complaint Chief Complaint  Patient presents with  . Anemia   The history is provided by the patient, medical records and the spouse. No language interpreter was used.    William [name] is a 66 y.o. male with h/o HTN, DM, anemia and blood transfusion presenting to the Emergency Department concerning fatigue at night x ~1 week. SOB on exertion associated. 1 episode of tremors noted recently after prolonged outdoor exposure. Blood work performed 2 days ago with PCP revealed low iron levels and positive guaiac result. No dizziness, lightheadedness or bloody stools. No other complaints at this time.   Past Medical History:  Diagnosis Date  . AAA (abdominal aortic aneurysm) ([place]) 03/2014   3.2 cm on MR abd.  F/u aortic u/s 06/2014 showed 3.0 x 3.1 cm AAA: recheck 2 yrs recommended (followed by cardiologist)  . Anemia   . Bilateral renal cysts    simple (03/2014 MRI)  . Blood transfusion without reported diagnosis   . CAD (coronary artery disease)   . Cholelithiases 2018   asymptomatic  . Chronic diastolic heart failure ([place])   . Cirrhosis ([place]) 05/2016   secondary to NASH; most recent u/s abd 07/2016 showed splenomegaly.  Hx of portal HTN changes with mild ascites and splenomegaly.  . Diabetes mellitus with complication ([place]) 46/2703   A1c 6.8%  . Hyperlipemia, mixed    elevated LFTs when on statins.    . Hypertension    Cr bump 04/01/16 so I changed benicar-hct to benicar plain and added amlodipine 5 mg.  . Iron deficiency anemia 05/2016   Acute blood loss anemia: hospitalized, required transfusion x 3 U: colonoscopy and capsule study unrevealing.   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  W/u for ? hemolytic anemia to be pursued by hematologist as outpt.  Appt with [facility]. Cristi Loron at [facility] 09/13/16.  . Microscopic hematuria    Eval unremarkable by Dr. Eulogio Ditch.  [name] NASH (nonalcoholic steatohepatitis)    Fatty liver on MR abd 03/2014, + hx of elevated transaminases and bili: followed by Dr. [name].  . Obesity   . Spleen enlarged     Patient Active Problem List   Diagnosis Date Noted  . Acute blood loss anemia 08/31/2016  . Iron deficiency anemia due to chronic blood loss 08/31/2016  . Acute GI bleeding 08/31/2016  . Cirrhosis, nonalcoholic ([place]) 50/10/3816  . Portal hypertension ([place]) 08/31/2016  . Splenomegaly 08/31/2016  . Esophageal varices ([place]) 08/31/2016  . Symptomatic anemia 07/30/2016  . Thrombocytopenia ([place]) 07/29/2016  . Weakness 07/28/2016  . Aortic stenosis, mild 07/28/2016  . Chronic diastolic heart failure ([place])   . History of GI bleed 05/22/2016  . Pulmonary edema 05/22/2016  . Acute on chronic renal failure ([place]) 05/22/2016  . Non-insulin treated type 2 diabetes mellitus ([place]) 05/22/2016  . Moderate obesity 11/15/2013  . Abdominal aortic aneurysm ([place]) 03/19/2013  . Hematuria 03/19/2013  . Morbid obesity ([place]) 08/24/2012  . 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 Metabolic syndrome 29/93/7169  . Essential hypertension 08/24/2012  . Mixed hyperlipidemia 08/24/2012    Past Surgical  History:  Procedure Laterality Date  . CARDIAC CATHETERIZATION    . CARDIOVASCULAR STRESS TEST  01/17/2012; 05/04/16   Normal stress nuclear study x 2 (2018--Normal perfusion. LVEF 66% with normal wall motion. This is a low risk study).  . CERVICAL DISCECTOMY  1992  . COLONOSCOPY N/A 05/27/2016   No site/explanation for blood loss found.  Erythematous mucosa in cecum and ascending colon--Cecal bx: normal.  Procedure: COLONOSCOPY;  Surgeon: Carol Ada, MD;   Location[facility] ENDOSCOPY;  Service: Endoscopy;  Laterality: N/A;  . COLONOSCOPY W/ POLYPECTOMY  09/10/2014   Polypectomy x 2: recall 5 yrs (Dr. [name]).  . CORONARY ANGIOPLASTY    . CORONARY ARTERY BYPASS GRAFT  03/2006  . ENTEROSCOPY N/A 07/31/2016   Procedure: ENTEROSCOPY;  Surgeon: Ladene Artist, MD;  Location: [facility] ENDOSCOPY;  Service: Endoscopy;  Laterality: N/A;  . ESOPHAGOGASTRODUODENOSCOPY N/A 05/25/2016   No site/explanation for blood loss found.  Procedure: ESOPHAGOGASTRODUODENOSCOPY (EGD);  Surgeon: Juanita Craver, MD;  Location: [facility] ENDOSCOPY;  Service: Endoscopy;  Laterality: N/A;  . GIVENS CAPSULE STUDY N/A 05/27/2016   Procedure: GIVENS CAPSULE STUDY;  Surgeon: Carol Ada, MD;  Location: [place];  Service: Endoscopy;  Laterality: N/A;  . NECK SURGERY  1991  . TRANSTHORACIC ECHOCARDIOGRAM  05/25/2016   EF 60%, normal wall motion, grd II DD, mild aortic stenosis, dilated aortic root and ascending aorta.  [name] VASECTOMY  1977       Home Medications    Prior to Admission medications   Medication Sig Start Date End Date Taking? Authorizing Provider  cholecalciferol (VITAMIN D) 1000 units tablet Take 1,000 Units by mouth daily.    [provider]  docusate sodium (COLACE) 100 MG capsule Take 100 mg by mouth 2 (two) times daily as needed for mild constipation (1 - 2 tabs as needed).    [provider]  fluticasone (FLONASE) 50 MCG/ACT nasal spray Place 2 sprays into both nostrils daily. Patient taking differently: Place 2 sprays into both nostrils daily as needed for allergies.  01/01/16   Withrow, Elyse Jarvis, FNP  iron polysaccharides (NIFEREX) 150 MG capsule Take 1 capsule (150 mg total) by mouth daily. 06/22/16   McGowen, Adrian Blackwater, MD  metoprolol tartrate (LOPRESSOR) 25 MG tablet Take 0.5 tablets (12.5 mg total) by mouth 2 (two) times daily. 06/22/16   McGowen, Adrian Blackwater, MD  omega-3 acid ethyl esters (LOVAZA) 1 g capsule TAKE 2 CAPSULES BY MOUTH ONCE DAILY Patient  taking differently: TAKE 1 CAPSULES BY MOUTH TWICE DAILY 03/24/16   Troy Sine, MD  pantoprazole (PROTONIX) 40 MG tablet Take 1 tablet (40 mg total) by mouth 2 (two) times daily before a meal. 09/03/16   Patrecia Pour, MD  potassium chloride SA (K-DUR,KLOR-CON) 20 MEQ tablet Take 1 tablet (20 mEq total) by mouth 2 (two) times daily. 06/22/16   McGowen, Adrian Blackwater, MD  spironolactone (ALDACTONE) 25 MG tablet Take 1 tablet (25 mg total) by mouth daily. 06/22/16   McGowen, Adrian Blackwater, MD  torsemide (DEMADEX) 20 MG tablet Take 2 tablets (40 mg total) by mouth daily. 06/22/16   McGowen, Adrian Blackwater, MD    Family History Family History  Problem Relation Age of Onset  . Hypertension Mother   . Cancer - Other Mother        liver cancer  . Heart disease Father   . Heart attack Father   . Cancer - Lung Father   . Diabetes Father   . Liver disease Sister   .  Anemia Sister   . Heart disease Sister   . Heart attack Sister   . Heart disease Brother        CABG 20 YEARS AGO  . Hypertension Brother   . Mesothelioma Brother        HALF-BROTHER  . Cancer - Other Brother        CLL  . Cancer - Lung Brother        Mets to brain  . Cancer - Lung Sister        HALF-SISTER  . Liver disease Brother   . Heart disease Brother        CABG-2012  . Emphysema Maternal Grandfather   . Cancer - Other Paternal Grandmother        Stomach  . Heart attack Paternal Grandfather     Social History Social History  Substance Use Topics  . Smoking status: Former Smoker    Types: Cigarettes  . Smokeless tobacco: Never Used     Comment: QUIT I 1983  . Alcohol use No     Allergies   Statins   Review of Systems Review of Systems  Constitutional: Positive for fatigue.  Respiratory: Positive for shortness of breath.   Gastrointestinal: Negative for blood in stool.  Neurological: Positive for tremors. Negative for dizziness, light-headedness and headaches.  All other systems reviewed and are  negative.    Physical Exam Updated Vital Signs BP (!) 114/56 (BP Location: Right Arm)   Pulse 80   Temp 98.2 F (36.8 C) (Oral)   Resp 16   Ht 5' 8.5" (1.74 m)   Wt 244 lb (110.7 kg)   SpO2 97%   BMI 36.56 kg/m   Physical Exam  Constitutional: He is oriented to person, place, and time. He appears well-developed and well-nourished.  HENT:  Head: Normocephalic and atraumatic.  Eyes: EOM are normal.  Neck: Normal range of motion.  Cardiovascular: Normal rate, regular rhythm and intact distal pulses.   Murmur heard. Pulmonary/Chest: Effort normal and breath sounds normal. No respiratory distress.  Abdominal: Soft. He exhibits no distension. There is no tenderness.  Musculoskeletal: Normal range of motion.  Neurological: He is alert and oriented to person, place, and time.  Skin: Skin is warm and dry.  Psychiatric: He has a normal mood and affect. Judgment normal.  Nursing note and vitals reviewed.    ED Treatments / Results  DIAGNOSTIC STUDIES: Oxygen Saturation is 97% on RA, NL by my interpretation.    COORDINATION OF CARE: 11:15 AM-Discussed next steps with pt. Pt verbalized understanding and is agreeable with the plan. Ordered blood transfusion.   Labs (all labs ordered are listed, but only abnormal results are displayed) Labs Reviewed  COMPREHENSIVE METABOLIC PANEL - Abnormal; Notable for the following:       Result Value   Sodium 132 (*)    Glucose, Bld 187 (*)    Calcium 8.4 (*)    Total Protein 6.2 (*)    Albumin 2.7 (*)    Total Bilirubin 3.4 (*)    GFR calc non Af Amer 58 (*)    All other components within normal limits  CBC - Abnormal; Notable for the following:    RBC 2.43 (*)    Hemoglobin 6.1 (*)    HCT 19.7 (*)    MCH 25.1 (*)    All other components within normal limits  CBC - Abnormal; Notable for the following:    RBC 2.83 (*)    Hemoglobin 7.5 (*)  HCT 23.4 (*)    Platelets 142 (*)    All other components within normal limits  CBC -  Abnormal; Notable for the following:    RBC 2.72 (*)    Hemoglobin 7.0 (*)    HCT 22.1 (*)    MCH 25.7 (*)    Platelets 110 (*)    All other components within normal limits  COMPREHENSIVE METABOLIC PANEL - Abnormal; Notable for the following:    Glucose, Bld 106 (*)    Calcium 8.4 (*)    Total Protein 5.6 (*)    Albumin 2.4 (*)    Total Bilirubin 4.0 (*)    All other components within normal limits  GLUCOSE, CAPILLARY - Abnormal; Notable for the following:    Glucose-Capillary 161 (*)    All other components within normal limits  GLUCOSE, CAPILLARY - Abnormal; Notable for the following:    Glucose-Capillary 102 (*)    All other components within normal limits  HEMOGLOBIN AND HEMATOCRIT, BLOOD - Abnormal; Notable for the following:    Hemoglobin 8.7 (*)    HCT 27.1 (*)    All other components within normal limits  GLUCOSE, CAPILLARY - Abnormal; Notable for the following:    Glucose-Capillary 114 (*)    All other components within normal limits  CBC WITH DIFFERENTIAL/PLATELET - Abnormal; Notable for the following:    RBC 3.07 (*)    Hemoglobin 8.1 (*)    HCT 25.1 (*)    Platelets 116 (*)    All other components within normal limits  BASIC METABOLIC PANEL - Abnormal; Notable for the following:    Glucose, Bld 108 (*)    Calcium 8.5 (*)    All other components within normal limits  GLUCOSE, CAPILLARY - Abnormal; Notable for the following:    Glucose-Capillary 107 (*)    All other components within normal limits  GLUCOSE, CAPILLARY - Abnormal; Notable for the following:    Glucose-Capillary 162 (*)    All other components within normal limits  GLUCOSE, CAPILLARY - Abnormal; Notable for the following:    Glucose-Capillary 122 (*)    All other components within normal limits  GLUCOSE, CAPILLARY  MAGNESIUM  TYPE AND SCREEN  PREPARE RBC (CROSSMATCH)  PREPARE RBC (CROSSMATCH)    EKG  EKG Interpretation None       Radiology No results found.  Procedures Procedures  (including critical care time)  CRITICAL CARE Performed by: Virgel Manifold Total critical care time: 30 minutes Critical care time was exclusive of separately billable procedures and treating other patients. Critical care was necessary to treat or prevent imminent or life-threatening deterioration. Critical care was time spent personally by me on the following activities: development of treatment plan with patient and/or surrogate as well as nursing, discussions with consultants, evaluation of patient's response to treatment, examination of patient, obtaining history from patient or surrogate, ordering and performing treatments and interventions, ordering and review of laboratory studies, ordering and review of radiographic studies, pulse oximetry and re-evaluation of patient's condition.   Medications Ordered in ED Medications  0.9 %  sodium chloride infusion (250 mLs Intravenous New Bag/Given 09/22/16 0945)  octreotide (SANDOSTATIN) 2 mcg/mL load via infusion 50 mcg (50 mcg Intravenous Bolus from Bag 09/22/16 2256)     Initial Impression / Assessment and Plan / ED Course  I have reviewed the triage vital signs and the nursing notes.  Pertinent labs & imaging results that were available during my care of the patient were reviewed by me  and considered in my medical decision making (see chart for details).    Symptomatic anemia. Transfuse prbcs. Admit.   Final Clinical Impressions(s) / ED Diagnoses   Final diagnoses:  Symptomatic anemia  Non-insulin treated type 2 diabetes mellitus ([place])  Hx of CABG    New Prescriptions Discharge Medication List as of 09/23/2016 12:25 PM    I personally preformed the services scribed in my presence. The recorded information has been reviewed is accurate. Virgel Manifold, MD.    Virgel Manifold, MD 10/07/16 [id number]

## 2016-09-21 NOTE — Progress Notes (Signed)
Nurse arrived onto unit A&O x4. No c/o pain. 2nd blood transfusion finishing up. Pt instructed to call for help when needed and verbalizes understanding.

## 2016-09-21 NOTE — ED Notes (Signed)
Patient resting on stretcher no complaints wife at beside. Blood infusing

## 2016-09-21 NOTE — ED Notes (Signed)
Patient is resting on stretcher no complaints at present.

## 2016-09-21 NOTE — ED Notes (Signed)
2 units of blood ready, per blood bank. Claiborne Billings RN aware.

## 2016-09-21 NOTE — H&P (Signed)
History and Physical    William [name] AQT:[id number] DOB: [birth year] DOA: 09/21/2016  PCP: Tammi Sou, MD Patient coming from: home  Chief Complaint: fatigue and weakness  HPI: William [name] is a 66 y.o. male with medical history significant of recurrent symptomatic anemia with microscopic hematuria, iron deficiency anemia, NASH, AAA, chronic diastolic heart failure, DM, HLD, HTN. Pt presenting w/ recurrent symtpomatic anemia. Told by PCP to come to hospital for transfusion due to low Hgb noted during office visit.  Patient reports several day history of worsening fatigue. Symptoms are fairly constant with waxing and waning nature. Endorses occasionally feeling short of breath and an inability to perform typical work around the house such as yard work. Patient denies any current or recent chest pain, palpitations, abdominal pain, dysuria, frequency, hematemesis, hematochezia, flank pain, neck stiffness, headache, focal neurological deficits. Patient doesn't endorse continuation of intermittent dark stools but are not described as being melanotic. Patient states to take iron.   ED Course: objective findings outlined below. 2 Units PRBC ordered  Review of Systems: As per HPI otherwise all other systems reviewed and are negative  Ambulatory Status:no restrictions    Past Medical History:  Diagnosis Date  . AAA (abdominal aortic aneurysm) ([place]) 03/2014   3.2 cm on MR abd.  F/u aortic u/s 06/2014 showed 3.0 x 3.1 cm AAA: recheck 2 yrs recommended (followed by cardiologist)  . Anemia   . Bilateral renal cysts    simple (03/2014 MRI)  . Blood transfusion without reported diagnosis   . CAD (coronary artery disease)   . Cholelithiases 2018   asymptomatic  . Chronic diastolic heart failure ([place])   . Cirrhosis ([place]) 05/2016   secondary to NASH; most recent u/s abd 07/2016 showed splenomegaly.  Hx of portal HTN changes with mild ascites and splenomegaly.  . Diabetes mellitus with  complication ([place]) 56/2563   A1c 6.8%  . Hyperlipemia, mixed    elevated LFTs when on statins.    . Hypertension    Cr bump 04/01/16 so I changed benicar-hct to benicar plain and added amlodipine 5 mg.  . Iron deficiency anemia 05/2016   Acute blood loss anemia: hospitalized, required transfusion x 3 U: colonoscopy and capsule study unrevealing.  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  W/u for ? hemolytic anemia to be pursued by hematologist as outpt.  Appt with [facility] Cristi Loron at [facility] 09/13/16.  . Microscopic hematuria    Eval unremarkable by Dr. Eulogio Ditch.  [name] NASH (nonalcoholic steatohepatitis)    Fatty liver on MR abd 03/2014, + hx of elevated transaminases and bili: followed by Dr. [name].  . Obesity   . Spleen enlarged     Past Surgical History:  Procedure Laterality Date  . CARDIAC CATHETERIZATION    . CARDIOVASCULAR STRESS TEST  01/17/2012; 05/04/16   Normal stress nuclear study x 2 (2018--Normal perfusion. LVEF 66% with normal wall motion. This is a low risk study).  . CERVICAL DISCECTOMY  1992  . COLONOSCOPY N/A 05/27/2016   No site/explanation for blood loss found.  Erythematous mucosa in cecum and ascending colon--Cecal bx: normal.  Procedure: COLONOSCOPY;  Surgeon: Carol Ada, MD;  Location: [facility] ENDOSCOPY;  Service: Endoscopy;  Laterality: N/A;  . COLONOSCOPY W/ POLYPECTOMY  09/10/2014   Polypectomy x 2: recall 5 yrs (Dr. [name]).  . CORONARY ANGIOPLASTY    . CORONARY ARTERY BYPASS GRAFT  03/2006  .  ENTEROSCOPY N/A 07/31/2016   Procedure: ENTEROSCOPY;  Surgeon: Ladene Artist, MD;  Location: [facility] ENDOSCOPY;  Service: Endoscopy;  Laterality: N/A;  . ESOPHAGOGASTRODUODENOSCOPY N/A 05/25/2016   No site/explanation for blood loss found.  Procedure: ESOPHAGOGASTRODUODENOSCOPY (EGD);  Surgeon: Juanita Craver, MD;  Location: [facility] ENDOSCOPY;  Service: Endoscopy;  Laterality: N/A;  . GIVENS CAPSULE STUDY  N/A 05/27/2016   Procedure: GIVENS CAPSULE STUDY;  Surgeon: Carol Ada, MD;  Location: [place];  Service: Endoscopy;  Laterality: N/A;  . NECK SURGERY  1991  . TRANSTHORACIC ECHOCARDIOGRAM  05/25/2016   EF 60%, normal wall motion, grd II DD, mild aortic stenosis, dilated aortic root and ascending aorta.  [name] VASECTOMY  1977    Social History   Social History  . Marital status: Married    Spouse name: N/A  . Number of children: N/A  . Years of education: N/A   Occupational History  . Not on file.   Social History Main Topics  . Smoking status: Former Smoker    Types: Cigarettes  . Smokeless tobacco: Never Used     Comment: QUIT I 1983  . Alcohol use No  . Drug use: No  . Sexual activity: Yes   Other Topics Concern  . Not on file   Social History Narrative   Married, 3 grown children, 3 GCs.   Educ: 10th grade.   Occupation: Retired Brewing technologist.   Tob: quit 1983, smoked about 20 pack-yr hx prior.   No alcohol.    Allergies  Allergen Reactions  . Statins Other (See Comments)    Liver enzymes go up whenever on them    Family History  Problem Relation Age of Onset  . Hypertension Mother   . Cancer - Other Mother        liver cancer  . Heart disease Father   . Heart attack Father   . Cancer - Lung Father   . Diabetes Father   . Liver disease Sister   . Anemia Sister   . Heart disease Sister   . Heart attack Sister   . Heart disease Brother        CABG 20 YEARS AGO  . Hypertension Brother   . Mesothelioma Brother        HALF-BROTHER  . Cancer - Other Brother        CLL  . Cancer - Lung Brother        Mets to brain  . Cancer - Lung Sister        HALF-SISTER  . Liver disease Brother   . Heart disease Brother        CABG-2012  . Emphysema Maternal Grandfather   . Cancer - Other Paternal Grandmother        Stomach  . Heart attack Paternal Grandfather       Prior to Admission medications   Medication Sig Start Date End Date Taking? Authorizing  Provider  cholecalciferol (VITAMIN D) 1000 units tablet Take 1,000 Units by mouth daily.    [provider]  docusate sodium (COLACE) 100 MG capsule Take 100 mg by mouth 2 (two) times daily as needed for mild constipation (1 - 2 tabs as needed).    [provider]  fluticasone (FLONASE) 50 MCG/ACT nasal spray Place 2 sprays into both nostrils daily. Patient taking differently: Place 2 sprays into both nostrils daily as needed for allergies.  01/01/16   Withrow, Elyse Jarvis, FNP  iron polysaccharides (NIFEREX) 150 MG capsule Take 1 capsule (  150 mg total) by mouth daily. 06/22/16   McGowen, Adrian Blackwater, MD  metoprolol tartrate (LOPRESSOR) 25 MG tablet Take 0.5 tablets (12.5 mg total) by mouth 2 (two) times daily. 06/22/16   McGowen, Adrian Blackwater, MD  omega-3 acid ethyl esters (LOVAZA) 1 g capsule TAKE 2 CAPSULES BY MOUTH ONCE DAILY Patient taking differently: TAKE 1 CAPSULES BY MOUTH TWICE DAILY 03/24/16   Troy Sine, MD  pantoprazole (PROTONIX) 40 MG tablet Take 1 tablet (40 mg total) by mouth 2 (two) times daily before a meal. 09/03/16   Patrecia Pour, MD  potassium chloride SA (K-DUR,KLOR-CON) 20 MEQ tablet Take 1 tablet (20 mEq total) by mouth 2 (two) times daily. 06/22/16   McGowen, Adrian Blackwater, MD  spironolactone (ALDACTONE) 25 MG tablet Take 1 tablet (25 mg total) by mouth daily. 06/22/16   McGowen, Adrian Blackwater, MD  torsemide (DEMADEX) 20 MG tablet Take 2 tablets (40 mg total) by mouth daily. 06/22/16   Tammi Sou, MD    Physical Exam: Vitals:   09/21/16 1200 09/21/16 1203 09/21/16 1222 09/21/16 1230  BP: 109/64 109/64 117/61 (!) 118/59  Pulse: 74 74 75 77  Resp: 18 16 18 18   Temp:  98.5 F (36.9 C) 97.9 F (36.6 C)   TempSrc:   Oral   SpO2: 100%   100%  Weight:      Height:         General:  Appears calm and comfortable Eyes:  PERRL, EOMI, normal lids, iris ENT:  grossly normal hearing, lips & tongue, mmm Neck:  no LAD, masses or thyromegaly Cardiovascular:  RRR, IV/VI  systolic murmur. No LE edema.  Respiratory:  CTA bilaterally, no w/r/r. Normal respiratory effort. Abdomen:  soft, ntnd, NABS Skin:  no rash or induration seen on limited exam Musculoskeletal:  grossly normal tone BUE/BLE, good ROM, no bony abnormality Psychiatric:  grossly normal mood and affect, speech fluent and appropriate, AOx3 Neurologic:  CN 2-12 grossly intact, moves all extremities in coordinated fashion, sensation intact  Labs on Admission: I have personally reviewed following labs and imaging studies  CBC:  Recent Labs Lab 09/19/16 1627 09/21/16 1030  WBC 6.7 6.0  HGB 6.6 Repeated and verified X2.* 6.1*  HCT 21.0 Repeated and verified X2.* 19.7*  MCV 82.8 81.1  PLT 174.0 283   Basic Metabolic Panel:  Recent Labs Lab 09/21/16 1030  NA 132*  K 3.8  CL 102  CO2 23  GLUCOSE 187*  BUN 13  CREATININE 1.24  CALCIUM 8.4*   GFR: Estimated Creatinine Clearance: 70.3 mL/min (by C-G formula based on SCr of 1.24 mg/dL). Liver Function Tests:  Recent Labs Lab 09/21/16 1030  AST 38  ALT 31  ALKPHOS 70  BILITOT 3.4*  PROT 6.2*  ALBUMIN 2.7*   No results for input(s): LIPASE, AMYLASE in the last 168 hours. No results for input(s): AMMONIA in the last 168 hours. Coagulation Profile: No results for input(s): INR, PROTIME in the last 168 hours. Cardiac Enzymes: No results for input(s): CKTOTAL, CKMB, CKMBINDEX, TROPONINI in the last 168 hours. BNP (last 3 results)  Recent Labs  07/28/16 1416  PROBNP 120   HbA1C: No results for input(s): HGBA1C in the last 72 hours. CBG: No results for input(s): GLUCAP in the last 168 hours. Lipid Profile: No results for input(s): CHOL, HDL, LDLCALC, TRIG, CHOLHDL, LDLDIRECT in the last 72 hours. Thyroid Function Tests: No results for input(s): TSH, T4TOTAL, FREET4, T3FREE, THYROIDAB in the last 72 hours. Anemia  Panel: No results for input(s): VITAMINB12, FOLATE, FERRITIN, TIBC, IRON, RETICCTPCT in the last 72  hours. Urine analysis:    Component Value Date/Time   COLORURINE YELLOW 08/01/2016 0542   APPEARANCEUR CLEAR 08/01/2016 0542   LABSPEC 1.010 08/01/2016 0542   PHURINE 5.0 08/01/2016 0542   GLUCOSEU NEGATIVE 08/01/2016 0542   HGBUR NEGATIVE 08/01/2016 0542   BILIRUBINUR NEGATIVE 08/01/2016 0542   KETONESUR NEGATIVE 08/01/2016 0542   PROTEINUR NEGATIVE 08/01/2016 0542   NITRITE NEGATIVE 08/01/2016 0542   LEUKOCYTESUR NEGATIVE 08/01/2016 0542    Creatinine Clearance: Estimated Creatinine Clearance: 70.3 mL/min (by C-G formula based on SCr of 1.24 mg/dL).  Sepsis Labs: @LABRCNTIP (procalcitonin:4,lacticidven:4) )No results found for this or any previous visit (from the past 240 hour(s)).   Radiological Exams on Admission: No results found.   Assessment/Plan Active Problems:   Essential hypertension   History of GI bleed   Non-insulin treated type 2 diabetes mellitus (HCC)   Chronic diastolic heart failure (HCC)   Weakness   Symptomatic anemia   Iron deficiency anemia due to chronic blood loss   Cirrhosis, nonalcoholic (HCC)    Symptomatic Anemia: recurrent condition for pt. Followed and worked up extensively by both hematology and gastroenterology. Please see information from last discharge summary in sum third 2018 as follows: "Per hematology note, hemolysis workup was unremarkable, myeloma panel and serum free light chains were unremarkable. Low level hemolysis cannot be ruled out. Has had extensive GI investigation in the past including colonoscopy, EGD, capsule endoscopy, small bowel enteroscopy which was unrevealing by report." Pt is set to see H/O, Dr. Irene Limbo on 09/27/16. I have discussed the possibility of haivng PRN transfusions through their office in order to avoid recurrent admissions since there is no further workup recommended for this pt at a [facility] facility. - 2 Units PRBC -Post transfusion CBC  Patient stays overnight consider 1 more unit PRBC in a.m. prior to  discharge - Continue supplemental iron  GI bleed: Review of patient's records indicates he was FOBT positive that clinic visit 2 days prior to admission. Patient with extensive GI workup as outlined above. Patient has been referred to Urology team at [facility] and this set for his initial evaluation on 10/12/2016. No further GI workup is recommended at this time. - Treatment of anemia as above - Follow-up with [facility] on 10/12/2016 - Contineu PPI  HTN: - continue metop  NASH: at baseline - continue spironolactone, torsemide.   Chronic Diastolic congestive heart failure: Last echo showing EF of 15% grade 2 diastolic dysfunction. No evidence of acute consultation. - Continue Lasix and spironolactone and a beta blocker.  Hyperglycemia/DM: Patient with A1c's intermittently in the prediabetic and diabetic ranges. Last A1c 3 months ago 5.4. A portion of this test is unreliable as of late due to recurrent blood transfusions. Currently glucose 187. - Consider outpatient A1c was hemoglobin stabilizes  - CBG monitoring - SSI   DVT prophylaxis: scd  Code Status: full  Family Communication: wife  Disposition Plan: pending improvement in subjective sx after PRBC transfusions  Consults called: none  Admission status: observation    Primus Gritton J MD [facility]  If 7PM-7AM, please contact night-coverage [email or web address] Password [email or web address]  09/21/2016, 1:02 PM

## 2016-09-22 DIAGNOSIS — K7469 Other cirrhosis of liver: Secondary | ICD-10-CM | POA: Diagnosis not present

## 2016-09-22 DIAGNOSIS — K3189 Other diseases of stomach and duodenum: Secondary | ICD-10-CM | POA: Diagnosis not present

## 2016-09-22 DIAGNOSIS — D649 Anemia, unspecified: Secondary | ICD-10-CM

## 2016-09-22 DIAGNOSIS — I10 Essential (primary) hypertension: Secondary | ICD-10-CM | POA: Diagnosis not present

## 2016-09-22 DIAGNOSIS — R3129 Other microscopic hematuria: Secondary | ICD-10-CM | POA: Diagnosis not present

## 2016-09-22 DIAGNOSIS — D500 Iron deficiency anemia secondary to blood loss (chronic): Secondary | ICD-10-CM | POA: Diagnosis not present

## 2016-09-22 DIAGNOSIS — I110 Hypertensive heart disease with heart failure: Secondary | ICD-10-CM | POA: Diagnosis not present

## 2016-09-22 DIAGNOSIS — K766 Portal hypertension: Secondary | ICD-10-CM | POA: Diagnosis not present

## 2016-09-22 DIAGNOSIS — K7460 Unspecified cirrhosis of liver: Secondary | ICD-10-CM | POA: Diagnosis not present

## 2016-09-22 DIAGNOSIS — Z8719 Personal history of other diseases of the digestive system: Secondary | ICD-10-CM | POA: Diagnosis not present

## 2016-09-22 DIAGNOSIS — I5032 Chronic diastolic (congestive) heart failure: Secondary | ICD-10-CM

## 2016-09-22 DIAGNOSIS — D509 Iron deficiency anemia, unspecified: Secondary | ICD-10-CM | POA: Diagnosis not present

## 2016-09-22 DIAGNOSIS — R5383 Other fatigue: Secondary | ICD-10-CM | POA: Diagnosis not present

## 2016-09-22 DIAGNOSIS — K922 Gastrointestinal hemorrhage, unspecified: Secondary | ICD-10-CM | POA: Diagnosis not present

## 2016-09-22 DIAGNOSIS — R195 Other fecal abnormalities: Secondary | ICD-10-CM | POA: Diagnosis not present

## 2016-09-22 DIAGNOSIS — I8510 Secondary esophageal varices without bleeding: Secondary | ICD-10-CM | POA: Diagnosis not present

## 2016-09-22 DIAGNOSIS — R531 Weakness: Secondary | ICD-10-CM | POA: Diagnosis not present

## 2016-09-22 LAB — GLUCOSE, CAPILLARY
GLUCOSE-CAPILLARY: 102 mg/dL — AB (ref 65–99)
GLUCOSE-CAPILLARY: 107 mg/dL — AB (ref 65–99)
Glucose-Capillary: 114 mg/dL — ABNORMAL HIGH (ref 65–99)
Glucose-Capillary: 162 mg/dL — ABNORMAL HIGH (ref 65–99)

## 2016-09-22 LAB — CBC
HCT: 22.1 % — ABNORMAL LOW (ref 39.0–52.0)
Hemoglobin: 7 g/dL — ABNORMAL LOW (ref 13.0–17.0)
MCH: 25.7 pg — ABNORMAL LOW (ref 26.0–34.0)
MCHC: 31.7 g/dL (ref 30.0–36.0)
MCV: 81.3 fL (ref 78.0–100.0)
PLATELETS: 110 10*3/uL — AB (ref 150–400)
RBC: 2.72 MIL/uL — ABNORMAL LOW (ref 4.22–5.81)
RDW: 15.1 % (ref 11.5–15.5)
WBC: 4.8 10*3/uL (ref 4.0–10.5)

## 2016-09-22 LAB — COMPREHENSIVE METABOLIC PANEL
ALT: 27 U/L (ref 17–63)
AST: 34 U/L (ref 15–41)
Albumin: 2.4 g/dL — ABNORMAL LOW (ref 3.5–5.0)
Alkaline Phosphatase: 63 U/L (ref 38–126)
Anion gap: 5 (ref 5–15)
BILIRUBIN TOTAL: 4 mg/dL — AB (ref 0.3–1.2)
BUN: 12 mg/dL (ref 6–20)
CO2: 26 mmol/L (ref 22–32)
CREATININE: 1.21 mg/dL (ref 0.61–1.24)
Calcium: 8.4 mg/dL — ABNORMAL LOW (ref 8.9–10.3)
Chloride: 105 mmol/L (ref 101–111)
Glucose, Bld: 106 mg/dL — ABNORMAL HIGH (ref 65–99)
POTASSIUM: 4.3 mmol/L (ref 3.5–5.1)
Sodium: 136 mmol/L (ref 135–145)
TOTAL PROTEIN: 5.6 g/dL — AB (ref 6.5–8.1)

## 2016-09-22 LAB — HEMOGLOBIN AND HEMATOCRIT, BLOOD
HCT: 27.1 % — ABNORMAL LOW (ref 39.0–52.0)
Hemoglobin: 8.7 g/dL — ABNORMAL LOW (ref 13.0–17.0)

## 2016-09-22 LAB — PREPARE RBC (CROSSMATCH)

## 2016-09-22 MED ORDER — SODIUM CHLORIDE 0.9 % IV SOLN
Freq: Once | INTRAVENOUS | Status: AC
  Administered 2016-09-22: 250 mL via INTRAVENOUS

## 2016-09-22 MED ORDER — OCTREOTIDE LOAD VIA INFUSION
50.0000 ug | Freq: Once | INTRAVENOUS | Status: AC
  Administered 2016-09-22: 50 ug via INTRAVENOUS
  Filled 2016-09-22: qty 25

## 2016-09-22 MED ORDER — SODIUM CHLORIDE 0.9 % IV SOLN
50.0000 ug/h | INTRAVENOUS | Status: DC
  Administered 2016-09-23: 50 ug/h via INTRAVENOUS
  Filled 2016-09-22 (×3): qty 1

## 2016-09-22 NOTE — Progress Notes (Signed)
Patient ID: William [name], male   DOB: [birth year], 66 y.o.   MRN: [id number]  PROGRESS NOTE    William [name]  WUJ:[id number] DOB: Nov 01, 1950 DOA: 09/21/2016 PCP: Tammi Sou, MD   Brief Narrative:  66 year old male with history of recurrent symptomatic anemia with microscopic hematuria, iron deficiency anemia, NASH, AAA, chronic diastolic heart failure, diabetes mellitus, HLD, hypertension presented with symptomatic anemia with initial hemoglobin of 6.1 on presentation. He was transfused 2 units of packed red cells.   Assessment & Plan:   Active Problems:   Essential hypertension   History of GI bleed   Non-insulin treated type 2 diabetes mellitus (HCC)   Chronic diastolic heart failure (HCC)   Weakness   Symptomatic anemia   Iron deficiency anemia due to chronic blood loss   Cirrhosis, nonalcoholic (HCC)   Symptomatic anemia - Status post 2 units packed red cells transfusion. Hemoglobin 7 this morning. We'll transfuse 1 more unit of packed red cells and check hemoglobin afterwards. - Outpatient follow-up with hematology as scheduled with Dr.Kale - Consulted Dr. Allene Pyo  Probable GI bleed - Hemoglobin 7 this morning, transfuse 1 more unit of packed cells.  - Awaiting GI evaluation and recommendations - Continue Protonix - Repeat a.m. CBC  Hypertension - Pressure controlled. Continue metoprolol  NASH - At baseline. Continue spironolactone and torsemide. Follow-up with GI as an outpatient  Chronic diastolic congestive heart failure - Last echo showing EF of 95% grade 2 diastolic dysfunction. No evidence of acute consultation. - Continue Lasix and spironolactone and a beta blocker.  Thrombocytopenia - Questionable cause. Monitor  DVT prophylaxis: SCDs. Avoid Lovenox because of probable GI bleed Code Status:  Full Family Communication: None at bedside Disposition Plan: Home in 1-2 days  Consultants: GI  Procedures: None  Antimicrobials:  None Subjective: Patient seen and examined at bedside. He denies any overnight fever, nausea or vomiting.  Objective: Vitals:   09/22/16 0518 09/22/16 0948 09/22/16 1013 09/22/16 1243  BP: (!) 101/52 (!) 107/58 (!) 108/55 115/60  Pulse: 77 78 80 76  Resp:  18 18 18   Temp:  98.4 F (36.9 C) 97.8 F (36.6 C) 97.8 F (36.6 C)  TempSrc:  Oral Oral Oral  SpO2:  99% 96% 97%  Weight:      Height:        Intake/Output Summary (Last 24 hours) at 09/22/16 1542 Last data filed at 09/22/16 1516  Gross per 24 hour  Intake             1359 ml  Output             2795 ml  Net            -1436 ml   Filed Weights   09/21/16 1018 09/21/16 1729  Weight: 110.7 kg (244 lb) 109.4 kg (241 lb 3.2 oz)    Examination:  General exam: Appears calm and comfortable  Respiratory system: Bilateral decreased breath sound at bases Cardiovascular system: S1 & S2 heard, Rate controlled Gastrointestinal system: Abdomen is nondistended, soft and nontender. Normal bowel sounds heard. Extremities: No cyanosis, clubbing, edema     Data Reviewed: I have personally reviewed following labs and imaging studies  CBC:  Recent Labs Lab 09/19/16 1627 09/21/16 1030 09/21/16 1847 09/22/16 0412 09/22/16 1458  WBC 6.7 6.0 5.0 4.8  --   HGB 6.6 Repeated and verified X2.* 6.1* 7.5* 7.0* 8.7*  HCT 21.0 Repeated and verified X2.* 19.7* 23.4* 22.1* 27.1*  MCV 82.8 81.1  82.7 81.3  --   PLT 174.0 155 142* 110*  --    Basic Metabolic Panel:  Recent Labs Lab 09/21/16 1030 09/22/16 0412  NA 132* 136  K 3.8 4.3  CL 102 105  CO2 23 26  GLUCOSE 187* 106*  BUN 13 12  CREATININE 1.24 1.21  CALCIUM 8.4* 8.4*   GFR: Estimated Creatinine Clearance: 71.1 mL/min (by C-G formula based on SCr of 1.21 mg/dL). Liver Function Tests:  Recent Labs Lab 09/21/16 1030 09/22/16 0412  AST 38 34  ALT 31 27  ALKPHOS 70 63  BILITOT 3.4* 4.0*  PROT 6.2* 5.6*  ALBUMIN 2.7* 2.4*   No results for input(s): LIPASE,  AMYLASE in the last 168 hours. No results for input(s): AMMONIA in the last 168 hours. Coagulation Profile: No results for input(s): INR, PROTIME in the last 168 hours. Cardiac Enzymes: No results for input(s): CKTOTAL, CKMB, CKMBINDEX, TROPONINI in the last 168 hours. BNP (last 3 results)  Recent Labs  07/28/16 1416  PROBNP 120   HbA1C: No results for input(s): HGBA1C in the last 72 hours. CBG:  Recent Labs Lab 09/21/16 1734 09/21/16 2150 09/22/16 0801 09/22/16 1250  GLUCAP 82 161* 102* 114*   Lipid Profile: No results for input(s): CHOL, HDL, LDLCALC, TRIG, CHOLHDL, LDLDIRECT in the last 72 hours. Thyroid Function Tests: No results for input(s): TSH, T4TOTAL, FREET4, T3FREE, THYROIDAB in the last 72 hours. Anemia Panel: No results for input(s): VITAMINB12, FOLATE, FERRITIN, TIBC, IRON, RETICCTPCT in the last 72 hours. Sepsis Labs: No results for input(s): PROCALCITON, LATICACIDVEN in the last 168 hours.  No results found for this or any previous visit (from the past 240 hour(s)).       Radiology Studies: No results found.      Scheduled Meds: . cholecalciferol  1,000 Units Oral Daily  . insulin aspart  0-5 Units Subcutaneous QHS  . insulin aspart  0-9 Units Subcutaneous TID WC  . iron polysaccharides  150 mg Oral Daily  . metoprolol tartrate  12.5 mg Oral BID  . pantoprazole  40 mg Oral BID AC  . potassium chloride SA  20 mEq Oral BID  . spironolactone  25 mg Oral Daily  . torsemide  40 mg Oral Daily   Continuous Infusions:   LOS: 0 days        Aline August, MD [facility] Pager [phone]  If 7PM-7AM, please contact night-coverage [email or web address] Password [facility] 09/22/2016, 3:42 PM

## 2016-09-22 NOTE — Consult Note (Signed)
Reason for Consult: Anemia Referring Physician: [facility]  William [name] HPI: This is a 66 year old male with recurrent anemia and heme positive stool, portal HTN gastropathy, single esophageal varix, NASH, DM, hyperlipidemia, and HTN admitted to the hospital for a recurrent anemia.  He was feeling fatigued his his HGB was checked and his HGB on admission was 6.1 g/dL.  He has undergone extensive work up with an EGD, Colonoscopy, and enteroscopy.  It was during the enteroscopy that he was noted to have portal HTN gastropathy and a single varix.  The EGD two months prior was negative for these findings. An ultrasound performed on 07/29/2016 was consistent with cirrhosis as a nodular contour and splenomegaly was noted.  In the past CT and MRI scans fatty infiltration was noted. This year he has demonstrated a thrombocytopenia.  His liver enzymes were mildly elevated in the past and now his INR is abnormal, mild to moderately.  He does report having melena intermittently over the past several days.  Past Medical History:  Diagnosis Date  . AAA (abdominal aortic aneurysm) ([place]) 03/2014   3.2 cm on MR abd.  F/u aortic u/s 06/2014 showed 3.0 x 3.1 cm AAA: recheck 2 yrs recommended (followed by cardiologist)  . Anemia   . Bilateral renal cysts    simple (03/2014 MRI)  . CAD (coronary artery disease)   . Cholelithiases 2018   asymptomatic  . Chronic diastolic heart failure ([place])   . Cirrhosis ([place]) 05/2016   secondary to NASH; most recent u/s abd 07/2016 showed splenomegaly.  Hx of portal HTN changes with mild ascites and splenomegaly.  . Diabetes mellitus with complication ([place]) 16/0737   A1c 6.8%  . History of blood transfusion 2018 X 4 dates   "low blood" (09/21/2016)  . Hyperlipemia, mixed    elevated LFTs when on statins.    . Hypertension    Cr bump 04/01/16 so I changed benicar-hct to benicar plain and added amlodipine 5 mg.  . Iron deficiency anemia 05/2016   Acute blood loss anemia:  hospitalized, required transfusion x 3 U: colonoscopy and capsule study unrevealing.  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  W/u for ? hemolytic anemia to be pursued by hematologist as outpt.  Appt with Dr. Cristi Loron at [facility] 09/13/16.  . Microscopic hematuria    Eval unremarkable by Dr. Eulogio Ditch.  [name] NASH (nonalcoholic steatohepatitis)    Fatty liver on MR abd 03/2014, + hx of elevated transaminases and bili: followed by Dr. [name].  . Obesity   . Spleen enlarged     Past Surgical History:  Procedure Laterality Date  . CARDIAC CATHETERIZATION    . CARDIOVASCULAR STRESS TEST  01/17/2012; 05/04/16   Normal stress nuclear study x 2 (2018--Normal perfusion. LVEF 66% with normal wall motion. This is a low risk study).  . CERVICAL DISCECTOMY  1992  . COLONOSCOPY N/A 05/27/2016   No site/explanation for blood loss found.  Erythematous mucosa in cecum and ascending colon--Cecal bx: normal.  Procedure: COLONOSCOPY;  Surgeon: Carol Ada, MD;  Location[facility] ENDOSCOPY;  Service: Endoscopy;  Laterality: N/A;  . COLONOSCOPY W/ POLYPECTOMY  09/10/2014   Polypectomy x 2: recall 5 yrs (Dr. [name]).  . CORONARY ANGIOPLASTY    . CORONARY ARTERY BYPASS GRAFT  03/2006  . ENTEROSCOPY N/A 07/31/2016   Procedure: ENTEROSCOPY;  Surgeon: Ladene Artist, MD;  Location: [place];  Service:  Endoscopy;  Laterality: N/A;  . ESOPHAGOGASTRODUODENOSCOPY N/A 05/25/2016   No site/explanation for blood loss found.  Procedure: ESOPHAGOGASTRODUODENOSCOPY (EGD);  Surgeon: Juanita Craver, MD;  Location: [facility] ENDOSCOPY;  Service: Endoscopy;  Laterality: N/A;  . GIVENS CAPSULE STUDY N/A 05/27/2016   Procedure: GIVENS CAPSULE STUDY;  Surgeon: Carol Ada, MD;  Location: [place];  Service: Endoscopy;  Laterality: N/A;  . NECK SURGERY  1991  . TRANSTHORACIC ECHOCARDIOGRAM  05/25/2016   EF 60%, normal wall motion, grd II DD, mild aortic  stenosis, dilated aortic root and ascending aorta.  [name] VASECTOMY  1977    Family History  Problem Relation Age of Onset  . Hypertension Mother   . Cancer - Other Mother        liver cancer  . Heart disease Father   . Heart attack Father   . Cancer - Lung Father   . Diabetes Father   . Liver disease Sister   . Anemia Sister   . Heart disease Sister   . Heart attack Sister   . Heart disease Brother        CABG 20 YEARS AGO  . Hypertension Brother   . Mesothelioma Brother        HALF-BROTHER  . Cancer - Other Brother        CLL  . Cancer - Lung Brother        Mets to brain  . Cancer - Lung Sister        HALF-SISTER  . Liver disease Brother   . Heart disease Brother        CABG-2012  . Emphysema Maternal Grandfather   . Cancer - Other Paternal Grandmother        Stomach  . Heart attack Paternal Grandfather     Social History:  reports that he has quit smoking. His smoking use included Cigarettes. He has never used smokeless tobacco. He reports that he does not drink alcohol or use drugs.  Allergies:  Allergies  Allergen Reactions  . Statins Other (See Comments)    Liver enzymes go up whenever on them    Medications:  Scheduled: . cholecalciferol  1,000 Units Oral Daily  . insulin aspart  0-5 Units Subcutaneous QHS  . insulin aspart  0-9 Units Subcutaneous TID WC  . iron polysaccharides  150 mg Oral Daily  . metoprolol tartrate  12.5 mg Oral BID  . pantoprazole  40 mg Oral BID AC  . potassium chloride SA  20 mEq Oral BID  . spironolactone  25 mg Oral Daily  . torsemide  40 mg Oral Daily   Continuous:   Results for orders placed or performed during the hospital encounter of 09/21/16 (from the past 24 hour(s))  CBC     Status: Abnormal   Collection Time: 09/21/16  6:47 PM  Result Value Ref Range   WBC 5.0 4.0 - 10.5 K/uL   RBC 2.83 (L) 4.22 - 5.81 MIL/uL   Hemoglobin 7.5 (L) 13.0 - 17.0 g/dL   HCT 23.4 (L) 39.0 - 52.0 %   MCV 82.7 78.0 - 100.0 fL   MCH  26.5 26.0 - 34.0 pg   MCHC 32.1 30.0 - 36.0 g/dL   RDW 15.3 11.5 - 15.5 %   Platelets 142 (L) 150 - 400 K/uL  Glucose, capillary     Status: Abnormal   Collection Time: 09/21/16  9:50 PM  Result Value Ref Range   Glucose-Capillary 161 (H) 65 - 99 mg/dL  CBC  Status: Abnormal   Collection Time: 09/22/16  4:12 AM  Result Value Ref Range   WBC 4.8 4.0 - 10.5 K/uL   RBC 2.72 (L) 4.22 - 5.81 MIL/uL   Hemoglobin 7.0 (L) 13.0 - 17.0 g/dL   HCT 22.1 (L) 39.0 - 52.0 %   MCV 81.3 78.0 - 100.0 fL   MCH 25.7 (L) 26.0 - 34.0 pg   MCHC 31.7 30.0 - 36.0 g/dL   RDW 15.1 11.5 - 15.5 %   Platelets 110 (L) 150 - 400 K/uL  Comprehensive metabolic panel     Status: Abnormal   Collection Time: 09/22/16  4:12 AM  Result Value Ref Range   Sodium 136 135 - 145 mmol/L   Potassium 4.3 3.5 - 5.1 mmol/L   Chloride 105 101 - 111 mmol/L   CO2 26 22 - 32 mmol/L   Glucose, Bld 106 (H) 65 - 99 mg/dL   BUN 12 6 - 20 mg/dL   Creatinine, Ser 1.21 0.61 - 1.24 mg/dL   Calcium 8.4 (L) 8.9 - 10.3 mg/dL   Total Protein 5.6 (L) 6.5 - 8.1 g/dL   Albumin 2.4 (L) 3.5 - 5.0 g/dL   AST 34 15 - 41 U/L   ALT 27 17 - 63 U/L   Alkaline Phosphatase 63 38 - 126 U/L   Total Bilirubin 4.0 (H) 0.3 - 1.2 mg/dL   GFR calc non Af Amer >60 >60 mL/min   GFR calc Af Amer >60 >60 mL/min   Anion gap 5 5 - 15  Glucose, capillary     Status: Abnormal   Collection Time: 09/22/16  8:01 AM  Result Value Ref Range   Glucose-Capillary 102 (H) 65 - 99 mg/dL  Prepare RBC     Status: None   Collection Time: 09/22/16  9:27 AM  Result Value Ref Range   Order Confirmation ORDER PROCESSED BY BLOOD BANK   Glucose, capillary     Status: Abnormal   Collection Time: 09/22/16 12:50 PM  Result Value Ref Range   Glucose-Capillary 114 (H) 65 - 99 mg/dL  BLOOD TRANSFUSION REPORT - SCANNED     Status: None   Collection Time: 09/22/16 12:58 PM   Narrative   Ordered by an unspecified provider.  Hemoglobin and hematocrit, blood     Status: Abnormal    Collection Time: 09/22/16  2:58 PM  Result Value Ref Range   Hemoglobin 8.7 (L) 13.0 - 17.0 g/dL   HCT 27.1 (L) 39.0 - 52.0 %  Glucose, capillary     Status: Abnormal   Collection Time: 09/22/16  5:28 PM  Result Value Ref Range   Glucose-Capillary 107 (H) 65 - 99 mg/dL     No results found.  ROS:  As stated above in the HPI otherwise negative.  Blood pressure 115/60, pulse 76, temperature 97.8 F (36.6 C), temperature source Oral, resp. rate 18, height 5' 8"  (1.727 m), weight 109.4 kg (241 lb 3.2 oz), SpO2 97 %.    PE: Gen: NAD, Alert and Oriented HEENT:  [place]/AT, EOMI Neck: Supple, no LAD Lungs: CTA Bilaterally CV: RRR without M/G/R ABM: Soft, NTND, +BS Ext: No C/C/E  Assessment/Plan: 1) Anemia. 2) Cirrhosis - suggested by EGD, imaging, and blood work. 3) Heme positive stool. 4) Esophageal varix. 5) Portal HTN gastropathy.   I am highly suspicious that the site of bleeding is from the upper GI tract.  He may be having a variceal bleed or bleeding from portal HTN gastropathy.  It is  difficult to discern.  I have noted in the past that aberrant vessels as a result of cirrhosis may be the source of bleeding from the stomach.   Plan: 1) EGD in the AM. 2) Octreotide for now.  Karole Oo D 09/22/2016, 6:12 PM

## 2016-09-23 ENCOUNTER — Observation Stay (HOSPITAL_COMMUNITY): Payer: 59 | Admitting: Certified Registered"

## 2016-09-23 ENCOUNTER — Encounter (HOSPITAL_COMMUNITY): Payer: Self-pay | Admitting: *Deleted

## 2016-09-23 ENCOUNTER — Encounter (HOSPITAL_COMMUNITY)
Admission: EM | Disposition: A | Discharge status: Home / Self Care | Payer: Self-pay | Source: Home / Self Care | Attending: Family Medicine

## 2016-09-23 DIAGNOSIS — K922 Gastrointestinal hemorrhage, unspecified: Secondary | ICD-10-CM

## 2016-09-23 DIAGNOSIS — K766 Portal hypertension: Secondary | ICD-10-CM | POA: Diagnosis not present

## 2016-09-23 DIAGNOSIS — K3189 Other diseases of stomach and duodenum: Secondary | ICD-10-CM | POA: Diagnosis not present

## 2016-09-23 DIAGNOSIS — D509 Iron deficiency anemia, unspecified: Secondary | ICD-10-CM | POA: Diagnosis not present

## 2016-09-23 DIAGNOSIS — I8510 Secondary esophageal varices without bleeding: Secondary | ICD-10-CM | POA: Diagnosis not present

## 2016-09-23 DIAGNOSIS — K921 Melena: Secondary | ICD-10-CM | POA: Diagnosis not present

## 2016-09-23 DIAGNOSIS — Z8719 Personal history of other diseases of the digestive system: Secondary | ICD-10-CM | POA: Diagnosis not present

## 2016-09-23 DIAGNOSIS — R531 Weakness: Secondary | ICD-10-CM | POA: Diagnosis not present

## 2016-09-23 DIAGNOSIS — I10 Essential (primary) hypertension: Secondary | ICD-10-CM | POA: Diagnosis not present

## 2016-09-23 DIAGNOSIS — D649 Anemia, unspecified: Secondary | ICD-10-CM | POA: Diagnosis not present

## 2016-09-23 DIAGNOSIS — R195 Other fecal abnormalities: Secondary | ICD-10-CM | POA: Diagnosis not present

## 2016-09-23 DIAGNOSIS — K7469 Other cirrhosis of liver: Secondary | ICD-10-CM | POA: Diagnosis not present

## 2016-09-23 DIAGNOSIS — R3129 Other microscopic hematuria: Secondary | ICD-10-CM | POA: Diagnosis not present

## 2016-09-23 DIAGNOSIS — I5032 Chronic diastolic (congestive) heart failure: Secondary | ICD-10-CM | POA: Diagnosis not present

## 2016-09-23 DIAGNOSIS — K7460 Unspecified cirrhosis of liver: Secondary | ICD-10-CM | POA: Diagnosis not present

## 2016-09-23 DIAGNOSIS — I110 Hypertensive heart disease with heart failure: Secondary | ICD-10-CM | POA: Diagnosis not present

## 2016-09-23 DIAGNOSIS — D500 Iron deficiency anemia secondary to blood loss (chronic): Secondary | ICD-10-CM | POA: Diagnosis not present

## 2016-09-23 HISTORY — PX: ESOPHAGOGASTRODUODENOSCOPY: SHX5428

## 2016-09-23 LAB — CBC WITH DIFFERENTIAL/PLATELET
Basophils Absolute: 0.1 10*3/uL (ref 0.0–0.1)
Basophils Relative: 1 %
EOS PCT: 11 %
Eosinophils Absolute: 0.6 10*3/uL (ref 0.0–0.7)
HCT: 25.1 % — ABNORMAL LOW (ref 39.0–52.0)
Hemoglobin: 8.1 g/dL — ABNORMAL LOW (ref 13.0–17.0)
LYMPHS ABS: 1.3 10*3/uL (ref 0.7–4.0)
LYMPHS PCT: 26 %
MCH: 26.4 pg (ref 26.0–34.0)
MCHC: 32.3 g/dL (ref 30.0–36.0)
MCV: 81.8 fL (ref 78.0–100.0)
MONO ABS: 0.6 10*3/uL (ref 0.1–1.0)
MONOS PCT: 13 %
Neutro Abs: 2.4 10*3/uL (ref 1.7–7.7)
Neutrophils Relative %: 49 %
PLATELETS: 116 10*3/uL — AB (ref 150–400)
RBC: 3.07 MIL/uL — AB (ref 4.22–5.81)
RDW: 15.4 % (ref 11.5–15.5)
WBC: 4.9 10*3/uL (ref 4.0–10.5)

## 2016-09-23 LAB — TYPE AND SCREEN
ABO/RH(D): O POS
Antibody Screen: NEGATIVE
Unit division: 0
Unit division: 0
Unit division: 0

## 2016-09-23 LAB — BASIC METABOLIC PANEL
Anion gap: 9 (ref 5–15)
BUN: 13 mg/dL (ref 6–20)
CO2: 23 mmol/L (ref 22–32)
CREATININE: 1.15 mg/dL (ref 0.61–1.24)
Calcium: 8.5 mg/dL — ABNORMAL LOW (ref 8.9–10.3)
Chloride: 104 mmol/L (ref 101–111)
GFR calc Af Amer: >60 mL/min (ref >60)
GLUCOSE: 108 mg/dL — AB (ref 65–99)
POTASSIUM: 4.1 mmol/L (ref 3.5–5.1)
Sodium: 136 mmol/L (ref 135–145)

## 2016-09-23 LAB — BPAM RBC
BLOOD PRODUCT EXPIRATION DATE: 201809052359
BLOOD PRODUCT EXPIRATION DATE: 201809082359
BLOOD PRODUCT EXPIRATION DATE: 201809082359
ISSUE DATE / TIME: 201808151146
ISSUE DATE / TIME: 201808151420
ISSUE DATE / TIME: 201808160948
UNIT TYPE AND RH: 5100
Unit Type and Rh: 5100
Unit Type and Rh: 5100

## 2016-09-23 LAB — GLUCOSE, CAPILLARY: GLUCOSE-CAPILLARY: 122 mg/dL — AB (ref 65–99)

## 2016-09-23 LAB — MAGNESIUM: Magnesium: 2 mg/dL (ref 1.7–2.4)

## 2016-09-23 SURGERY — EGD (ESOPHAGOGASTRODUODENOSCOPY)
Anesthesia: Monitor Anesthesia Care

## 2016-09-23 MED ORDER — LIDOCAINE 2% (20 MG/ML) 5 ML SYRINGE
INTRAMUSCULAR | Status: DC | PRN
  Administered 2016-09-23: 50 mg via INTRAVENOUS

## 2016-09-23 MED ORDER — LACTATED RINGERS IV SOLN
INTRAVENOUS | Status: DC | PRN
  Administered 2016-09-23: 07:00:00 via INTRAVENOUS

## 2016-09-23 MED ORDER — GLYCOPYRROLATE 0.2 MG/ML IJ SOLN
INTRAMUSCULAR | Status: DC | PRN
  Administered 2016-09-23: 0.1 mg via INTRAVENOUS

## 2016-09-23 MED ORDER — PROPOFOL 10 MG/ML IV BOLUS
INTRAVENOUS | Status: DC | PRN
  Administered 2016-09-23 (×2): 50 mg via INTRAVENOUS

## 2016-09-23 NOTE — H&P (View-Only) (Signed)
Reason for Consult: Anemia Referring Physician: [facility]  Shona Needles HPI: This is a 66 year old male with recurrent anemia and heme positive stool, portal HTN gastropathy, single esophageal varix, NASH, DM, hyperlipidemia, and HTN admitted to the hospital for a recurrent anemia.  He was feeling fatigued his his HGB was checked and his HGB on admission was 6.1 g/dL.  He has undergone extensive work up with an EGD, Colonoscopy, and enteroscopy.  It was during the enteroscopy that he was noted to have portal HTN gastropathy and a single varix.  The EGD two months prior was negative for these findings. An ultrasound performed on 07/29/2016 was consistent with cirrhosis as a nodular contour and splenomegaly was noted.  In the past CT and MRI scans fatty infiltration was noted. This year he has demonstrated a thrombocytopenia.  His liver enzymes were mildly elevated in the past and now his INR is abnormal, mild to moderately.  He does report having melena intermittently over the past several days.  Past Medical History:  Diagnosis Date  . AAA (abdominal aortic aneurysm) ([place]) 03/2014   3.2 cm on MR abd.  F/u aortic u/s 06/2014 showed 3.0 x 3.1 cm AAA: recheck 2 yrs recommended (followed by cardiologist)  . Anemia   . Bilateral renal cysts    simple (03/2014 MRI)  . CAD (coronary artery disease)   . Cholelithiases 2018   asymptomatic  . Chronic diastolic heart failure ([place])   . Cirrhosis ([place]) 05/2016   secondary to NASH; most recent u/s abd 07/2016 showed splenomegaly.  Hx of portal HTN changes with mild ascites and splenomegaly.  . Diabetes mellitus with complication ([place]) 74/8270   A1c 6.8%  . History of blood transfusion 2018 X 4 dates   "low blood" (09/21/2016)  . Hyperlipemia, mixed    elevated LFTs when on statins.    . Hypertension    Cr bump 04/01/16 so I changed benicar-hct to benicar plain and added amlodipine 5 mg.  . Iron deficiency anemia 05/2016   Acute blood loss anemia:  hospitalized, required transfusion x 3 U: colonoscopy and capsule study unrevealing.  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  W/u for ? hemolytic anemia to be pursued by hematologist as outpt.  Appt with Dr. Cristi Loron at [facility] 09/13/16.  . Microscopic hematuria    Eval unremarkable by Dr. Eulogio Ditch.  [name] NASH (nonalcoholic steatohepatitis)    Fatty liver on MR abd 03/2014, + hx of elevated transaminases and bili: followed by Dr. [name].  . Obesity   . Spleen enlarged     Past Surgical History:  Procedure Laterality Date  . CARDIAC CATHETERIZATION    . CARDIOVASCULAR STRESS TEST  01/17/2012; 05/04/16   Normal stress nuclear study x 2 (2018--Normal perfusion. LVEF 66% with normal wall motion. This is a low risk study).  . CERVICAL DISCECTOMY  1992  . COLONOSCOPY N/A 05/27/2016   No site/explanation for blood loss found.  Erythematous mucosa in cecum and ascending colon--Cecal bx: normal.  Procedure: COLONOSCOPY;  Surgeon: Carol Ada, MD;  Location: [facility] ENDOSCOPY;  Service: Endoscopy;  Laterality: N/A;  . COLONOSCOPY W/ POLYPECTOMY  09/10/2014   Polypectomy x 2: recall 5 yrs (Dr. [name]).  . CORONARY ANGIOPLASTY    . CORONARY ARTERY BYPASS GRAFT  03/2006  . ENTEROSCOPY N/A 07/31/2016   Procedure: ENTEROSCOPY;  Surgeon: Ladene Artist, MD;  Location: [place];  Service:  Endoscopy;  Laterality: N/A;  . ESOPHAGOGASTRODUODENOSCOPY N/A 05/25/2016   No site/explanation for blood loss found.  Procedure: ESOPHAGOGASTRODUODENOSCOPY (EGD);  Surgeon: Juanita Craver, MD;  Location: [facility] ENDOSCOPY;  Service: Endoscopy;  Laterality: N/A;  . GIVENS CAPSULE STUDY N/A 05/27/2016   Procedure: GIVENS CAPSULE STUDY;  Surgeon: Carol Ada, MD;  Location: [place];  Service: Endoscopy;  Laterality: N/A;  . NECK SURGERY  1991  . TRANSTHORACIC ECHOCARDIOGRAM  05/25/2016   EF 60%, normal wall motion, grd II DD, mild aortic  stenosis, dilated aortic root and ascending aorta.  [name] VASECTOMY  1977    Family History  Problem Relation Age of Onset  . Hypertension Mother   . Cancer - Other Mother        liver cancer  . Heart disease Father   . Heart attack Father   . Cancer - Lung Father   . Diabetes Father   . Liver disease Sister   . Anemia Sister   . Heart disease Sister   . Heart attack Sister   . Heart disease Brother        CABG 20 YEARS AGO  . Hypertension Brother   . Mesothelioma Brother        HALF-BROTHER  . Cancer - Other Brother        CLL  . Cancer - Lung Brother        Mets to brain  . Cancer - Lung Sister        HALF-SISTER  . Liver disease Brother   . Heart disease Brother        CABG-2012  . Emphysema Maternal Grandfather   . Cancer - Other Paternal Grandmother        Stomach  . Heart attack Paternal Grandfather     Social History:  reports that he has quit smoking. His smoking use included Cigarettes. He has never used smokeless tobacco. He reports that he does not drink alcohol or use drugs.  Allergies:  Allergies  Allergen Reactions  . Statins Other (See Comments)    Liver enzymes go up whenever on them    Medications:  Scheduled: . cholecalciferol  1,000 Units Oral Daily  . insulin aspart  0-5 Units Subcutaneous QHS  . insulin aspart  0-9 Units Subcutaneous TID WC  . iron polysaccharides  150 mg Oral Daily  . metoprolol tartrate  12.5 mg Oral BID  . pantoprazole  40 mg Oral BID AC  . potassium chloride SA  20 mEq Oral BID  . spironolactone  25 mg Oral Daily  . torsemide  40 mg Oral Daily   Continuous:   Results for orders placed or performed during the hospital encounter of 09/21/16 (from the past 24 hour(s))  CBC     Status: Abnormal   Collection Time: 09/21/16  6:47 PM  Result Value Ref Range   WBC 5.0 4.0 - 10.5 K/uL   RBC 2.83 (L) 4.22 - 5.81 MIL/uL   Hemoglobin 7.5 (L) 13.0 - 17.0 g/dL   HCT 23.4 (L) 39.0 - 52.0 %   MCV 82.7 78.0 - 100.0 fL   MCH  26.5 26.0 - 34.0 pg   MCHC 32.1 30.0 - 36.0 g/dL   RDW 15.3 11.5 - 15.5 %   Platelets 142 (L) 150 - 400 K/uL  Glucose, capillary     Status: Abnormal   Collection Time: 09/21/16  9:50 PM  Result Value Ref Range   Glucose-Capillary 161 (H) 65 - 99 mg/dL  CBC  Status: Abnormal   Collection Time: 09/22/16  4:12 AM  Result Value Ref Range   WBC 4.8 4.0 - 10.5 K/uL   RBC 2.72 (L) 4.22 - 5.81 MIL/uL   Hemoglobin 7.0 (L) 13.0 - 17.0 g/dL   HCT 22.1 (L) 39.0 - 52.0 %   MCV 81.3 78.0 - 100.0 fL   MCH 25.7 (L) 26.0 - 34.0 pg   MCHC 31.7 30.0 - 36.0 g/dL   RDW 15.1 11.5 - 15.5 %   Platelets 110 (L) 150 - 400 K/uL  Comprehensive metabolic panel     Status: Abnormal   Collection Time: 09/22/16  4:12 AM  Result Value Ref Range   Sodium 136 135 - 145 mmol/L   Potassium 4.3 3.5 - 5.1 mmol/L   Chloride 105 101 - 111 mmol/L   CO2 26 22 - 32 mmol/L   Glucose, Bld 106 (H) 65 - 99 mg/dL   BUN 12 6 - 20 mg/dL   Creatinine, Ser 1.21 0.61 - 1.24 mg/dL   Calcium 8.4 (L) 8.9 - 10.3 mg/dL   Total Protein 5.6 (L) 6.5 - 8.1 g/dL   Albumin 2.4 (L) 3.5 - 5.0 g/dL   AST 34 15 - 41 U/L   ALT 27 17 - 63 U/L   Alkaline Phosphatase 63 38 - 126 U/L   Total Bilirubin 4.0 (H) 0.3 - 1.2 mg/dL   GFR calc non Af Amer >60 >60 mL/min   GFR calc Af Amer >60 >60 mL/min   Anion gap 5 5 - 15  Glucose, capillary     Status: Abnormal   Collection Time: 09/22/16  8:01 AM  Result Value Ref Range   Glucose-Capillary 102 (H) 65 - 99 mg/dL  Prepare RBC     Status: None   Collection Time: 09/22/16  9:27 AM  Result Value Ref Range   Order Confirmation ORDER PROCESSED BY BLOOD BANK   Glucose, capillary     Status: Abnormal   Collection Time: 09/22/16 12:50 PM  Result Value Ref Range   Glucose-Capillary 114 (H) 65 - 99 mg/dL  BLOOD TRANSFUSION REPORT - SCANNED     Status: None   Collection Time: 09/22/16 12:58 PM   Narrative   Ordered by an unspecified provider.  Hemoglobin and hematocrit, blood     Status: Abnormal    Collection Time: 09/22/16  2:58 PM  Result Value Ref Range   Hemoglobin 8.7 (L) 13.0 - 17.0 g/dL   HCT 27.1 (L) 39.0 - 52.0 %  Glucose, capillary     Status: Abnormal   Collection Time: 09/22/16  5:28 PM  Result Value Ref Range   Glucose-Capillary 107 (H) 65 - 99 mg/dL     No results found.  ROS:  As stated above in the HPI otherwise negative.  Blood pressure 115/60, pulse 76, temperature 97.8 F (36.6 C), temperature source Oral, resp. rate 18, height 5' 8"  (1.727 m), weight 109.4 kg (241 lb 3.2 oz), SpO2 97 %.    PE: Gen: NAD, Alert and Oriented HEENT:  [place]/AT, EOMI Neck: Supple, no LAD Lungs: CTA Bilaterally CV: RRR without M/G/R ABM: Soft, NTND, +BS Ext: No C/C/E  Assessment/Plan: 1) Anemia. 2) Cirrhosis - suggested by EGD, imaging, and blood work. 3) Heme positive stool. 4) Esophageal varix. 5) Portal HTN gastropathy.   I am highly suspicious that the site of bleeding is from the upper GI tract.  He may be having a variceal bleed or bleeding from portal HTN gastropathy.  It is  difficult to discern.  I have noted in the past that aberrant vessels as a result of cirrhosis may be the source of bleeding from the stomach.   Plan: 1) EGD in the AM. 2) Octreotide for now.  Soua Lenk D 09/22/2016, 6:12 PM

## 2016-09-23 NOTE — Interval H&P Note (Signed)
History and Physical Interval Note:  09/23/2016 8:27 AM  William [name]  has presented today for surgery, with the diagnosis of Anemia and history of and esophageal varix.  The various methods of treatment have been discussed with the patient and family. After consideration of risks, benefits and other options for treatment, the patient has consented to  Procedure(s): ESOPHAGOGASTRODUODENOSCOPY (EGD) (N/A) as a surgical intervention .  The patient's history has been reviewed, patient examined, no change in status, stable for surgery.  I have reviewed the patient's chart and labs.  Questions were answered to the patient's satisfaction.     Albina Gosney D

## 2016-09-23 NOTE — Anesthesia Preprocedure Evaluation (Signed)
Anesthesia Evaluation  Patient identified by MRN, date of birth, ID band Patient awake    Reviewed: Allergy & Precautions, H&P , NPO status , Patient's Chart, lab work & pertinent test results  Airway Mallampati: I  TM Distance: >3 FB Neck ROM: full    Dental  (+) Teeth Intact, Dental Advidsory Given   Pulmonary former smoker,    breath sounds clear to auscultation       Cardiovascular hypertension, On Medications + CAD and + Peripheral Vascular Disease   Rhythm:Regular     Neuro/Psych    GI/Hepatic GERD  ,(+) Cirrhosis       , Hepatitis -  Endo/Other  diabetes, Type 2  Renal/GU Renal disease     Musculoskeletal   Abdominal   Peds  Hematology  (+) anemia ,   Anesthesia Other Findings   Reproductive/Obstetrics                             Anesthesia Physical  Anesthesia Plan  ASA: III  Anesthesia Plan: MAC   Post-op Pain Management:    Induction: Intravenous  PONV Risk Score and Plan:   Airway Management Planned: Nasal Cannula  Additional Equipment: None  Intra-op Plan:   Post-operative Plan:   Informed Consent: I have reviewed the patients History and Physical, chart, labs and discussed the procedure including the risks, benefits and alternatives for the proposed anesthesia with the patient or authorized representative who has indicated his/her understanding and acceptance.   Dental Advisory Given  Plan Discussed with: CRNA and Surgeon  Anesthesia Plan Comments:         Anesthesia Quick Evaluation                                   Anesthesia Evaluation  Patient identified by MRN, date of birth, ID band Patient awake    Reviewed: Allergy & Precautions, NPO status , Patient's Chart, lab work & pertinent test results  Airway Mallampati: III  TM Distance: >3 FB Neck ROM: Full    Dental no notable dental hx.    Pulmonary neg pulmonary ROS, former  smoker,    Pulmonary exam normal breath sounds clear to auscultation       Cardiovascular hypertension, + CAD and + Peripheral Vascular Disease  negative cardio ROS Normal cardiovascular exam Rhythm:Regular Rate:Normal     Neuro/Psych negative neurological ROS  negative psych ROS   GI/Hepatic negative GI ROS, (+) Hepatitis -  Endo/Other  diabetes, Type 2Morbid obesity  Renal/GU Renal diseasenegative Renal ROS     Musculoskeletal negative musculoskeletal ROS (+)   Abdominal (+) + obese,   Peds  Hematology negative hematology ROS (+)   Anesthesia Other Findings   Reproductive/Obstetrics negative OB ROS                             Anesthesia Physical Anesthesia Plan  ASA: III  Anesthesia Plan: MAC   Post-op Pain Management:    Induction: Intravenous  Airway Management Planned:   Additional Equipment:   Intra-op Plan:   Post-operative Plan:   Informed Consent: I have reviewed the patients History and Physical, chart, labs and discussed the procedure including the risks, benefits and alternatives for the proposed anesthesia with the patient or authorized representative who has indicated his/her understanding and acceptance.   Dental advisory given  Plan Discussed with: CRNA  Anesthesia Plan Comments:         Anesthesia Quick Evaluation

## 2016-09-23 NOTE — Discharge Summary (Signed)
Physician Discharge Summary  William [name] NWG:[id number] DOB: Oct 18, 1950 DOA: 09/21/2016  PCP: Tammi Sou, MD  Admit date: 09/21/2016 Discharge date: 09/23/2016  Admitted From: Home Disposition:  Home  Recommendations for Outpatient Follow-up:  1. Follow up with PCP in 1 week with repeat CBC/BMP 2. Follow-up with hematology/Dr. Irene Limbo as scheduled 3. Follow-up with gastroenterologist at [facility] on 10/12/2016 for spiral enteroscopy   Home Health: No  Equipment/Devices: None  Discharge Condition: Stable  CODE STATUS: Full  Diet recommendation: Heart Healthy / Carb Modified  Brief/Interim Summary: 66 year old male with history of recurrent symptomatic anemia with microscopic hematuria, iron deficiency anemia, NASH, AAA, chronic diastolic heart failure, diabetes mellitus, HLD, hypertension presented with symptomatic anemia with initial hemoglobin of 6.1 on presentation. He was transfused 3 units of packed red cells. Patient underwent upper GI endoscopy today and GI has cleared the patient for discharge.  Discharge Diagnoses:  Active Problems:   Essential hypertension   History of GI bleed   Non-insulin treated type 2 diabetes mellitus (HCC)   Chronic diastolic heart failure (HCC)   Weakness   Symptomatic anemia   Iron deficiency anemia due to chronic blood loss   Cirrhosis, nonalcoholic (HCC)  Symptomatic anemia - Status post 3 units packed red cells transfusion since admission. - Hemoglobin 8.1 this morning. Follow-up with primary care provider in a week with repeat CBC - Outpatient follow-up with hematology as scheduled with Dr.Kale  Probable upper GI bleed - Status post upper GI endoscopy done today which showed portal hypertensive gastropathy. GI has cleared the patient for discharge. Patient is to follow-up at [facility] on September 5 for spiral enteroscopy  Hypertension - Pressure controlled. Continue metoprolol, spironolactone and torsemide  NASH - At baseline. Continue  spironolactone and torsemide. Follow-up with GI as an outpatient  Chronic diastolic congestive heart failure - Last echo showing EF of 57% grade 2 diastolic dysfunction. No evidence of acute consultation. - Continue Lasix and spironolactone and a beta blocker.  Thrombocytopenia - Questionable cause. Outpatient follow-up  Discharge Instructions  Discharge Instructions    AMB Referral to [facility]    Complete by:  As directed    Please assign UMR member for post discharge call. Discharging from [facility] today. Already has LTW information for DM management. Please see notes for details. Thanks. Marthenia Rolling, [place], [facility] Liaison [phone]   Reason for consult:  Please assign UMR member for post discharge call   Diagnoses of:  Diabetes   Expected date of contact:  1-3 days (reserved for hospital discharges)   Call MD for:  difficulty breathing, headache or visual disturbances    Complete by:  As directed    Call MD for:  extreme fatigue    Complete by:  As directed    Call MD for:  hives    Complete by:  As directed    Call MD for:  persistant dizziness or light-headedness    Complete by:  As directed    Call MD for:  persistant nausea and vomiting    Complete by:  As directed    Call MD for:  severe uncontrolled pain    Complete by:  As directed    Call MD for:  temperature >100.4    Complete by:  As directed    Diet - low sodium heart healthy    Complete by:  As directed    Increase activity slowly    Complete by:  As directed  Allergies as of 09/23/2016      Reactions   Statins Other (See Comments)   Liver enzymes go up whenever on them      Medication List    TAKE these medications   cholecalciferol 1000 units tablet Commonly known as:  VITAMIN D Take 1,000 Units by mouth daily.   docusate sodium 100 MG capsule Commonly known as:  COLACE Take 100 mg by mouth 2 (two) times daily as needed for mild constipation (1 -  2 tabs as needed).   fluticasone 50 MCG/ACT nasal spray Commonly known as:  FLONASE Place 2 sprays into both nostrils daily. What changed:  when to take this  reasons to take this   iron polysaccharides 150 MG capsule Commonly known as:  NIFEREX Take 1 capsule (150 mg total) by mouth daily.   metoprolol tartrate 25 MG tablet Commonly known as:  LOPRESSOR Take 0.5 tablets (12.5 mg total) by mouth 2 (two) times daily.   omega-3 acid ethyl esters 1 g capsule Commonly known as:  LOVAZA TAKE 2 CAPSULES BY MOUTH ONCE DAILY What changed:  See the new instructions.   pantoprazole 40 MG tablet Commonly known as:  PROTONIX Take 1 tablet (40 mg total) by mouth 2 (two) times daily before a meal.   potassium chloride SA 20 MEQ tablet Commonly known as:  K-DUR,KLOR-CON Take 1 tablet (20 mEq total) by mouth 2 (two) times daily.   spironolactone 25 MG tablet Commonly known as:  ALDACTONE Take 1 tablet (25 mg total) by mouth daily.   torsemide 20 MG tablet Commonly known as:  DEMADEX Take 2 tablets (40 mg total) by mouth daily.      Follow-up Information    McGowen, Adrian Blackwater, MD In 1 week.   Specialty:  Family Medicine Why:  with repeat CBC/BMP/ keep schedule appointment Contact information: [address] [place] [address] [phone]        Brunetta Genera, MD.   Specialties:  Hematology, Oncology Why:  keep scheduled appointment Contact information: [place] [address] [phone]          Allergies  Allergen Reactions  . Statins Other (See Comments)    Liver enzymes go up whenever on them    Consultations: GI  Procedures/Studies: EGD on 09/23/2016 Impression:               - Normal esophagus.                           - Portal hypertensive gastropathy.                           - Non-bleeding erosive gastropathy.                           - Normal examined duodenum.                           - No specimens  collected   Subjective: Patient seen and examined at bedside. He denies any overnight fever, nausea or vomiting.  Discharge Exam: Vitals:   09/23/16 0800 09/23/16 0810  BP: (!) 106/59 (!) 101/56  Pulse: 75 78  Resp: 13 15  Temp:    SpO2: 94% 95%   Vitals:   09/23/16 0653 09/23/16 0754 09/23/16 0800 09/23/16 0810  BP: 105/69 (!) 93/48 (!) 106/59 (!) 101/56  Pulse: 70 76 75 78  Resp: 14 16 13 15   Temp: 98.1 F (36.7 C) 98.4 F (36.9 C)    TempSrc: Oral Oral    SpO2:  95% 94% 95%  Weight: 109.3 kg (241 lb)     Height: 5' 8"  (1.727 m)       General: Pt is alert, awake, not in acute distress Cardiovascular:Rate controlled, S1/S2 + Respiratory:Bilateral decreased breath sounds at bases  Abdominal: Soft, NT, ND, bowel sounds + Extremities: no edema, no cyanosis    The results of significant diagnostics from this hospitalization (including imaging, microbiology, ancillary and laboratory) are listed below for reference.     Microbiology: No results found for this or any previous visit (from the past 240 hour(s)).   Labs: BNP (last 3 results)  Recent Labs  05/04/16 1012 05/22/16 0713 05/26/16 0307  BNP 574.6* 765.6* 520.8*   Basic Metabolic Panel:  Recent Labs Lab 09/21/16 1030 09/22/16 0412 09/23/16 0512  NA 132* 136 136  K 3.8 4.3 4.1  CL 102 105 104  CO2 23 26 23   GLUCOSE 187* 106* 108*  BUN 13 12 13   CREATININE 1.24 1.21 1.15  CALCIUM 8.4* 8.4* 8.5*  MG  --   --  2.0   Liver Function Tests:  Recent Labs Lab 09/21/16 1030 09/22/16 0412  AST 38 34  ALT 31 27  ALKPHOS 70 63  BILITOT 3.4* 4.0*  PROT 6.2* 5.6*  ALBUMIN 2.7* 2.4*   No results for input(s): LIPASE, AMYLASE in the last 168 hours. No results for input(s): AMMONIA in the last 168 hours. CBC:  Recent Labs Lab 09/19/16 1627 09/21/16 1030 09/21/16 1847 09/22/16 0412 09/22/16 1458 09/23/16 0512  WBC 6.7 6.0 5.0 4.8  --  4.9  NEUTROABS  --   --   --   --   --  2.4  HGB 6.6  Repeated and verified X2.* 6.1* 7.5* 7.0* 8.7* 8.1*  HCT 21.0 Repeated and verified X2.* 19.7* 23.4* 22.1* 27.1* 25.1*  MCV 82.8 81.1 82.7 81.3  --  81.8  PLT 174.0 155 142* 110*  --  116*   Cardiac Enzymes: No results for input(s): CKTOTAL, CKMB, CKMBINDEX, TROPONINI in the last 168 hours. BNP: Invalid input(s): POCBNP CBG:  Recent Labs Lab 09/22/16 0801 09/22/16 1250 09/22/16 1728 09/22/16 2157 09/23/16 0912  GLUCAP 102* 114* 107* 162* 122*   D-Dimer No results for input(s): DDIMER in the last 72 hours. Hgb A1c No results for input(s): HGBA1C in the last 72 hours. Lipid Profile No results for input(s): CHOL, HDL, LDLCALC, TRIG, CHOLHDL, LDLDIRECT in the last 72 hours. Thyroid function studies No results for input(s): TSH, T4TOTAL, T3FREE, THYROIDAB in the last 72 hours.  Invalid input(s): FREET3 Anemia work up No results for input(s): VITAMINB12, FOLATE, FERRITIN, TIBC, IRON, RETICCTPCT in the last 72 hours. Urinalysis    Component Value Date/Time   COLORURINE YELLOW 08/01/2016 0542   APPEARANCEUR CLEAR 08/01/2016 0542   LABSPEC 1.010 08/01/2016 0542   PHURINE 5.0 08/01/2016 0542   GLUCOSEU NEGATIVE 08/01/2016 0542   HGBUR NEGATIVE 08/01/2016 0542   BILIRUBINUR NEGATIVE 08/01/2016 0542   KETONESUR NEGATIVE 08/01/2016 0542   PROTEINUR NEGATIVE 08/01/2016 0542   NITRITE NEGATIVE 08/01/2016 0542   LEUKOCYTESUR NEGATIVE 08/01/2016 0542   Sepsis Labs Invalid input(s): PROCALCITONIN,  WBC,  LACTICIDVEN Microbiology No results found for this or any previous visit (from the past 240 hour(s)).   Time coordinating  discharge: 35 minutes  SIGNED:   Aline August, MD  [facility] 09/23/2016, 1:55 PM Pager: [phone]  If 7PM-7AM, please contact night-coverage [email or web address] Password [email or web address]

## 2016-09-23 NOTE — Progress Notes (Signed)
Nsg Discharge Note  Admit Date:  09/21/2016 Discharge date: 09/23/2016   William [name] to be D/C'd Home per MD order.  AVS completed.  Copy for chart, and copy for patient signed, and dated. Patient/caregiver able to verbalize understanding.  Discharge Medication: Allergies as of 09/23/2016      Reactions   Statins Other (See Comments)   Liver enzymes go up whenever on them      Medication List    TAKE these medications   cholecalciferol 1000 units tablet Commonly known as:  VITAMIN D Take 1,000 Units by mouth daily.   docusate sodium 100 MG capsule Commonly known as:  COLACE Take 100 mg by mouth 2 (two) times daily as needed for mild constipation (1 - 2 tabs as needed).   fluticasone 50 MCG/ACT nasal spray Commonly known as:  FLONASE Place 2 sprays into both nostrils daily. What changed:  when to take this  reasons to take this   iron polysaccharides 150 MG capsule Commonly known as:  NIFEREX Take 1 capsule (150 mg total) by mouth daily.   metoprolol tartrate 25 MG tablet Commonly known as:  LOPRESSOR Take 0.5 tablets (12.5 mg total) by mouth 2 (two) times daily.   omega-3 acid ethyl esters 1 g capsule Commonly known as:  LOVAZA TAKE 2 CAPSULES BY MOUTH ONCE DAILY What changed:  See the new instructions.   pantoprazole 40 MG tablet Commonly known as:  PROTONIX Take 1 tablet (40 mg total) by mouth 2 (two) times daily before a meal.   potassium chloride SA 20 MEQ tablet Commonly known as:  K-DUR,KLOR-CON Take 1 tablet (20 mEq total) by mouth 2 (two) times daily.   spironolactone 25 MG tablet Commonly known as:  ALDACTONE Take 1 tablet (25 mg total) by mouth daily.   torsemide 20 MG tablet Commonly known as:  DEMADEX Take 2 tablets (40 mg total) by mouth daily.       Discharge Assessment: Vitals:   09/23/16 0800 09/23/16 0810  BP: (!) 106/59 (!) 101/56  Pulse: 75 78  Resp: 13 15  Temp:    SpO2: 94% 95%   Skin clean, dry and intact without  evidence of skin break down, no evidence of skin tears noted. IV catheter discontinued intact. Site without signs and symptoms of complications - no redness or edema noted at insertion site, patient denies c/o pain - only slight tenderness at site.  Dressing with slight pressure applied.  D/c Instructions-Education: Discharge instructions given to patient/family with verbalized understanding. D/c education completed with patient/family including follow up instructions, medication list, d/c activities limitations if indicated, with other d/c instructions as indicated by MD - patient able to verbalize understanding, all questions fully answered. Patient instructed to return to ED, call 911, or call MD for any changes in condition.  Patient escorted via [place], and D/C home via private auto.  Salley Slaughter, RN 09/23/2016 12:26 PM

## 2016-09-23 NOTE — Transfer of Care (Signed)
Immediate Anesthesia Transfer of Care Note  Patient: RADAMES MEJORADO  Procedure(s) Performed: Procedure(s): ESOPHAGOGASTRODUODENOSCOPY (EGD) (N/A)  Patient Location: Endoscopy Unit  Anesthesia Type:MAC  Level of Consciousness: awake, alert  and patient cooperative  Airway & Oxygen Therapy: Patient Spontanous Breathing  Post-op Assessment: Report given to RN, Post -op Vital signs reviewed and stable and Patient moving all extremities X 4  Post vital signs: Reviewed and stable  Last Vitals:  Vitals:   09/23/16 0547 09/23/16 0653  BP: (!) 100/54 105/69  Pulse: 70 70  Resp: 18 14  Temp: 36.7 C 36.7 C  SpO2: 95%     Last Pain:  Vitals:   09/23/16 0653  TempSrc: Oral  PainSc:          Complications: No apparent anesthesia complications

## 2016-09-23 NOTE — Consult Note (Signed)
   [facility] CM Inpatient Consult   09/23/2016  KAILEN HINKLE 12/03/1950 [id number]    Came to visit Mr. Guevara and wife at bedside to discuss Link to [employer] program for DM management for [employer] employees/dependents with [employer].   Both wife and patient are known to Probation officer. Mrs. Semper states they have LTW program information but have not yet enrolled due to "so much going on". Mrs. Gatt states, we are trying to figure out why he is bleeding and where it is coming from. Reports Mr. Stansbury has an appointment with [facility] 10/12/16 for a spiral endoscopy.   Denies needing another LTW packet or program information. Agreeable to post discharge call.  Appreciative of visit.  Made inpatient RNCM aware.   Marthenia Rolling, MSN-Ed, RN,BSN [facility] Liaison [phone]

## 2016-09-23 NOTE — Op Note (Signed)
[facility] Patient Name: William [name] Procedure Date : 09/23/2016 MRN: [id number] Attending MD: Carol Ada , MD Date of Birth: 03/17/1950 CSN: [id number] Age: 66 Admit Type: Inpatient Procedure:                Upper GI endoscopy Indications:              Iron deficiency anemia, Heme positive stool, Melena Providers:                Carol Ada, MD, Kingsley Plan, RN, Angus Seller, Tinnie Gens, Technician Referring MD:              Medicines:                Propofol per Anesthesia Complications:            No immediate complications. Estimated Blood Loss:     Estimated blood loss: none. Procedure:                Pre-Anesthesia Assessment:                           - Prior to the procedure, a History and Physical                            was performed, and patient medications and                            allergies were reviewed. The patient's tolerance of                            previous anesthesia was also reviewed. The risks                            and benefits of the procedure and the sedation                            options and risks were discussed with the patient.                            All questions were answered, and informed consent                            was obtained. Prior Anticoagulants: The patient has                            taken no previous anticoagulant or antiplatelet                            agents. ASA Grade Assessment: III - A patient with                            severe systemic disease. After reviewing the risks  and benefits, the patient was deemed in                            satisfactory condition to undergo the procedure.                           - Sedation was administered by an anesthesia                            professional. Deep sedation was attained.                           After obtaining informed consent, the endoscope was                             passed under direct vision. Throughout the                            procedure, the patient's blood pressure, pulse, and                            oxygen saturations were monitored continuously. The                            [id number] ([id number]) scope was introduced through the                            mouth, and advanced to the second part of duodenum.                            The upper GI endoscopy was accomplished without                            difficulty. The patient tolerated the procedure                            well. Scope In: Scope Out: Findings:      The esophagus was normal.      Mild portal hypertensive gastropathy was found in the gastric fundus and       in the gastric body.      A few localized, small non-bleeding erosions were found in the gastric       antrum. There were no stigmata of recent bleeding.      The examined duodenum was normal.      Careful, slow, and repeated evaluation of the upper GI tract was       performed. There was no overt evidence of an esophageal varix as       previously noted in June 2018, however, portal HTN gastropathy was       identified. The portal HTN gastropathy was mild, but friable. There was       no clear evidence of fundic varices. The symptomatic bleeding may have       decompressed the vessels, therefore they were not visible during this       examination. In the antrum superficial small erosions were identified  along with some mucosal erythema, but this is NOT the source of bleeding. Impression:               - Normal esophagus.                           - Portal hypertensive gastropathy.                           - Non-bleeding erosive gastropathy.                           - Normal examined duodenum.                           - No specimens collected. Moderate Sedation:      None Recommendation:           - Return patient to hospital ward for ongoing care.                           - Resume regular  diet.                           - Continue present medications.                           - Keep [facility] appointment on Sept 5th for spiral                            enteroscopy. Procedure Code(s):        --- Professional ---                           [phone], Esophagogastroduodenoscopy, flexible,                            transoral; diagnostic, including collection of                            specimen(s) by brushing or washing, when performed                            (separate procedure) Diagnosis Code(s):        --- Professional ---                           K76.6, Portal hypertension                           K31.89, Other diseases of stomach and duodenum                           D50.9, Iron deficiency anemia, unspecified                           R19.5, Other fecal abnormalities  K92.1, Melena (includes Hematochezia) CPT copyright 2016 American Medical Association. All rights reserved. The codes documented in this report are preliminary and upon coder review may  be revised to meet current compliance requirements. Carol Ada, MD Carol Ada, MD 09/23/2016 7:59:06 AM This report has been signed electronically. Number of Addenda: 0

## 2016-09-23 NOTE — Anesthesia Postprocedure Evaluation (Signed)
Anesthesia Post Note  Patient: William [name]  Procedure(s) Performed: Procedure(s) (LRB): ESOPHAGOGASTRODUODENOSCOPY (EGD) (N/A)     Patient location during evaluation: PACU Anesthesia Type: MAC Level of consciousness: awake and alert Pain management: pain level controlled Vital Signs Assessment: post-procedure vital signs reviewed and stable Respiratory status: spontaneous breathing, nonlabored ventilation and respiratory function stable Cardiovascular status: stable and blood pressure returned to baseline Anesthetic complications: no    Last Vitals:  Vitals:   09/23/16 0800 09/23/16 0810  BP: (!) 106/59 (!) 101/56  Pulse: 75 78  Resp: 13 15  Temp:    SpO2: 94% 95%    Last Pain:  Vitals:   09/23/16 0754  TempSrc: Oral  PainSc:                  Lynda Rainwater

## 2016-09-23 NOTE — Anesthesia Procedure Notes (Signed)
Procedure Name: MAC Date/Time: 09/23/2016 7:38 AM Performed by: Orlie Dakin Pre-anesthesia Checklist: Patient identified, Emergency Drugs available, Suction available, Patient being monitored and Timeout performed Patient Re-evaluated:Patient Re-evaluated prior to induction Oxygen Delivery Method: Nasal cannula Preoxygenation: Pre-oxygenation with 100% oxygen

## 2016-09-26 ENCOUNTER — Other Ambulatory Visit: Payer: Self-pay | Admitting: *Deleted

## 2016-09-26 ENCOUNTER — Encounter (HOSPITAL_COMMUNITY): Payer: Self-pay | Admitting: Gastroenterology

## 2016-09-26 ENCOUNTER — Telehealth: Payer: Self-pay

## 2016-09-26 ENCOUNTER — Encounter: Payer: Self-pay | Admitting: *Deleted

## 2016-09-26 NOTE — Telephone Encounter (Signed)
Paper chart pulled from [facility] records storage for statin history.  Crestor 20 mg was on in February 2008 to June 2017   Spoke with wife, he was on pravastatin prior to switching to [employer] in 2008.  He was able to take until last summer, when both Crestor and niacin were discontinued due to elevation of his liver enzymes. There is family history for liver disease in that his mother died from liver disease and his brother had a liver transplant.  In July 2016, his AST was 51 and ALT was 54.  Subsequent blood work by Dr. [name] revealed his AST increased at 87 and ALT 68.  On 01/15/2015, his AST was 85 and ALT 64.  At that time, total cholesterol was 244, triglycerides 149, HDL 30, and his LDL cholesterol on statin therapy had risen to 184.   Patient will get labs drawn by PCP tomorrow.   Can submit request at that time

## 2016-09-26 NOTE — Telephone Encounter (Signed)
Transition Care Management Follow-up Telephone Call    Date discharged? 09/23/16   How have you been since you were released from the hospital? "going real good"   Do you understand why you were in the hospital? yes   Do you understand the discharge instructions? yes   Where were you discharged to? Home. Lives with wife.    Items Reviewed:  Medications reviewed: Pt reports no changes.   Allergies reviewed: yes  Dietary changes reviewed: no changes.   Referrals reviewed: yes   Functional Questionnaire:   Activities of Daily Living (ADLs):   He states they are independent in the following: ambulation, bathing and hygiene, feeding, continence, grooming, toileting and dressing States they require assistance with the following: None.    Any transportation issues/concerns?: no   Any patient concerns? no   Confirmed importance and date/time of follow-up visits scheduled yes  Provider Appointment booked with PCP 09/29/16 @ 1030  Confirmed with patient if condition begins to worsen call PCP or go to the ER.  Patient was given the office number and encouraged to call back with question or concerns.  : yes

## 2016-09-26 NOTE — Patient Outreach (Signed)
[facility]) Care Management  09/26/2016  William [name] Jan 11, 1951 [id number]  Subjective: Telephone call to patient's home number, spoke with patient, and HIPAA verified.  States he and wife remembers speaking with this RNCM in the past and was expecting this call.  RNCM received verbal authorization to speak with patient's wife William [name]) as needed regarding healthcare needs.Discussed [facility]MR Transition of care follow up, patient voiced understanding, and is in agreement to follow up.   Patient states he is doing pretty good, much better than he was before he went to the hospital, has follow up appointment with primary MD on 09/29/16, and follow up appointment scheduled with hematologist on 09/27/16.   Patient voices understanding of medical diagnosis and treatment plan.  States he is accessing the following [facility] benefits: outpatient pharmacy, hospital indemnity, and wife currently has family medical leave act (FMLA) in place. Patient states he does not have any education material, transition of care, care coordination, disease management, disease monitoring, transportation, community resource, or pharmacy needs at this time. States he is very appreciative of the follow up and is in agreement to receive [place] [facility] information.    Objective:Per chart review, patient hospitalized 09/21/16 - 09/23/16 and 08/30/16 - 09/05/16 for anemia. Patient hospitalized 07/29/16 - 08/01/16, and 05/22/16 - 05/29/16 for gastrointestinal bleed. Patient also has a history of Acute blood loss anemia, Hyperbilirubinemia, Liver Cirrhoses/NAFLD (nonalcoholic fatty liver disease), Acute on chronic diastolic heart failure, CAD, hypertension, diabetes, mixed hyperlipidemia, and status post CABG in 2008.  Transition of care follow up completed by primary MD's office on 09/05/16. Most recent [place] [facility] transition of care follow up completed on 09/14/16.     Assessment:Received [facility]MR Transition of care referral on 09/26/16. Transition of care follow up completed, no care management needs, and will proceed with case closure.   Plan: RNCM will send patient successful outreach letter, [facility] pamphlet, and magnet. RNCM will send case closure due to follow up completed / no care management needs request to [facility].      Gunn[place] Chahal H. Annia Friendly, BSN, [facility] [facility] Telephonic CM Phone: [phone] Fax: [phone]

## 2016-09-26 NOTE — Telephone Encounter (Signed)
Noted.  TCM phone encounter today.

## 2016-09-27 ENCOUNTER — Other Ambulatory Visit (HOSPITAL_BASED_OUTPATIENT_CLINIC_OR_DEPARTMENT_OTHER): Payer: 59

## 2016-09-27 ENCOUNTER — Encounter: Payer: Self-pay | Admitting: Hematology

## 2016-09-27 ENCOUNTER — Telehealth: Payer: Self-pay | Admitting: Hematology

## 2016-09-27 ENCOUNTER — Ambulatory Visit (HOSPITAL_BASED_OUTPATIENT_CLINIC_OR_DEPARTMENT_OTHER): Payer: 59 | Admitting: Hematology

## 2016-09-27 VITALS — BP 108/41 | HR 73 | Temp 98.0°F | Resp 17 | Ht 68.0 in | Wt 240.3 lb

## 2016-09-27 DIAGNOSIS — D649 Anemia, unspecified: Secondary | ICD-10-CM

## 2016-09-27 DIAGNOSIS — D500 Iron deficiency anemia secondary to blood loss (chronic): Secondary | ICD-10-CM | POA: Diagnosis not present

## 2016-09-27 DIAGNOSIS — K7581 Nonalcoholic steatohepatitis (NASH): Secondary | ICD-10-CM

## 2016-09-27 DIAGNOSIS — D689 Coagulation defect, unspecified: Secondary | ICD-10-CM

## 2016-09-27 DIAGNOSIS — K922 Gastrointestinal hemorrhage, unspecified: Secondary | ICD-10-CM

## 2016-09-27 DIAGNOSIS — D62 Acute posthemorrhagic anemia: Secondary | ICD-10-CM | POA: Diagnosis not present

## 2016-09-27 DIAGNOSIS — K921 Melena: Secondary | ICD-10-CM

## 2016-09-27 DIAGNOSIS — K766 Portal hypertension: Secondary | ICD-10-CM

## 2016-09-27 LAB — COMPREHENSIVE METABOLIC PANEL
ALT: 34 U/L (ref 0–55)
ANION GAP: 7 meq/L (ref 3–11)
AST: 40 U/L — AB (ref 5–34)
Albumin: 2.8 g/dL — ABNORMAL LOW (ref 3.5–5.0)
Alkaline Phosphatase: 91 U/L (ref 40–150)
BUN: 11.4 mg/dL (ref 7.0–26.0)
CALCIUM: 9.4 mg/dL (ref 8.4–10.4)
CHLORIDE: 102 meq/L (ref 98–109)
CO2: 26 mEq/L (ref 22–29)
Creatinine: 1.2 mg/dL (ref 0.7–1.3)
EGFR: 65 mL/min/{1.73_m2} — ABNORMAL LOW (ref >90)
Glucose: 136 mg/dl (ref 70–140)
POTASSIUM: 3.8 meq/L (ref 3.5–5.1)
Sodium: 135 mEq/L — ABNORMAL LOW (ref 136–145)
Total Bilirubin: 3.97 mg/dL (ref 0.20–1.20)
Total Protein: 6.8 g/dL (ref 6.4–8.3)

## 2016-09-27 LAB — CBC & DIFF AND RETIC
BASO%: 1.7 % (ref 0.0–2.0)
Basophils Absolute: 0.1 10*3/uL (ref 0.0–0.1)
EOS%: 7.9 % — AB (ref 0.0–7.0)
Eosinophils Absolute: 0.5 10*3/uL (ref 0.0–0.5)
HEMATOCRIT: 28.3 % — AB (ref 38.4–49.9)
HGB: 8.9 g/dL — ABNORMAL LOW (ref 13.0–17.1)
Immature Retic Fract: 10 % (ref 3.00–10.60)
LYMPH#: 1.4 10*3/uL (ref 0.9–3.3)
LYMPH%: 23.2 % (ref 14.0–49.0)
MCH: 26 pg — AB (ref 27.2–33.4)
MCHC: 31.4 g/dL — AB (ref 32.0–36.0)
MCV: 82.7 fL (ref 79.3–98.0)
MONO#: 1.1 10*3/uL — AB (ref 0.1–0.9)
MONO%: 19.4 % — ABNORMAL HIGH (ref 0.0–14.0)
NEUT%: 47.8 % (ref 39.0–75.0)
NEUTROS ABS: 2.8 10*3/uL (ref 1.5–6.5)
PLATELETS: 125 10*3/uL — AB (ref 140–400)
RBC: 3.42 10*6/uL — AB (ref 4.20–5.82)
RDW: 16.1 % — AB (ref 11.0–14.6)
RETIC %: 3.62 % — AB (ref 0.80–1.80)
RETIC CT ABS: 123.8 10*3/uL — AB (ref 34.80–93.90)
WBC: 5.8 10*3/uL (ref 4.0–10.3)
nRBC: 0 % (ref 0–0)

## 2016-09-27 LAB — IRON AND TIBC
%SAT: 7 % — AB (ref 20–55)
IRON: 32 ug/dL — AB (ref 42–163)
TIBC: 473 ug/dL — ABNORMAL HIGH (ref 202–409)
UIBC: 441 ug/dL — AB (ref 117–376)

## 2016-09-27 LAB — FERRITIN: FERRITIN: 20 ng/mL — AB (ref 22–316)

## 2016-09-27 LAB — TECHNOLOGIST REVIEW

## 2016-09-27 MED ORDER — PHYTONADIONE 5 MG PO TABS: 5.0000 mg | ORAL_TABLET | Freq: Every day | ORAL | 0 refills | Status: DC

## 2016-09-27 MED FILL — PHYTONADIONE 5 MG TABS: 5 | 10 days supply | Qty: 10 | Fill #0

## 2016-09-27 NOTE — Patient Instructions (Signed)
Thank you for choosing [facility] to provide your oncology and hematology care.  To afford each patient quality time with our providers, please arrive 30 minutes before your scheduled appointment time.  If you arrive late for your appointment, you may be asked to reschedule.  We strive to give you quality time with our providers, and arriving late affects you and other patients whose appointments are after yours.   If you are a no show for multiple scheduled visits, you may be dismissed from the clinic at the providers discretion.    Again, thank you for choosing [facility], our hope is that these requests will decrease the amount of time that you wait before being seen by our physicians.  ______________________________________________________________________  Should you have questions after your visit to the [facility], please contact our office at [phone] between the hours of 8:30 and 4:30 p.m.    Voicemails left after 4:30p.m will not be returned until the following business day.    For prescription refill requests, please have your pharmacy contact us directly.  Please also try to allow 48 hours for prescription requests.    Please contact the scheduling department for questions regarding scheduling.  For scheduling of procedures such as PET scans, CT scans, MRI, Ultrasound, etc please contact central scheduling at [phone].    Resources For Cancer Patients and Caregivers:   Oncolink.org:  A wonderful resource for patients and healthcare providers for information regarding your disease, ways to tract your treatment, what to expect, etc.     American Cancer Society:  800-227-2345  Can help patients locate various types of support and financial assistance  Cancer Care: 1-800-813-HOPE (4673) Provides financial assistance, online support groups, medication/co-pay assistance.    [place] DSS:  336-641-3447 Where to apply for food  stamps, Medicaid, and utility assistance  Medicare Rights Center: 800-333-4114 Helps people with Medicare understand their rights and benefits, navigate the Medicare system, and secure the quality healthcare they deserve  SCAT: 336-333-6589 [place] [employer]'s shared-ride transportation service for eligible riders who have a disability that prevents them from riding the fixed route bus.    For additional information on assistance programs please contact our social worker:   Grier Hock/Abigail Elmore:  [phone]            

## 2016-09-27 NOTE — Telephone Encounter (Signed)
Printed AVS and calendar for patients September appointment.

## 2016-09-28 ENCOUNTER — Other Ambulatory Visit: Payer: Self-pay | Admitting: Family Medicine

## 2016-09-28 ENCOUNTER — Encounter: Payer: Self-pay | Admitting: Family Medicine

## 2016-09-28 DIAGNOSIS — E7800 Pure hypercholesterolemia, unspecified: Secondary | ICD-10-CM

## 2016-09-28 NOTE — Telephone Encounter (Signed)
Please advise. Thanks.  

## 2016-09-28 NOTE — Telephone Encounter (Signed)
OK, FLP ordered.

## 2016-09-29 ENCOUNTER — Encounter: Payer: Self-pay | Admitting: Family Medicine

## 2016-09-29 ENCOUNTER — Ambulatory Visit (INDEPENDENT_AMBULATORY_CARE_PROVIDER_SITE_OTHER): Payer: 59 | Admitting: Family Medicine

## 2016-09-29 VITALS — BP 121/71 | HR 78 | Temp 98.1°F | Resp 16 | Ht 68.0 in | Wt 244.8 lb

## 2016-09-29 DIAGNOSIS — D500 Iron deficiency anemia secondary to blood loss (chronic): Secondary | ICD-10-CM

## 2016-09-29 DIAGNOSIS — E7800 Pure hypercholesterolemia, unspecified: Secondary | ICD-10-CM

## 2016-09-29 DIAGNOSIS — D649 Anemia, unspecified: Secondary | ICD-10-CM

## 2016-09-29 LAB — LIPID PANEL
CHOLESTEROL: 180 mg/dL (ref 0–200)
HDL: 45 mg/dL (ref >39.00)
LDL CALC: 121 mg/dL — AB (ref 0–99)
NonHDL: 134.82
TRIGLYCERIDES: 69 mg/dL (ref 0.0–149.0)
Total CHOL/HDL Ratio: 4
VLDL: 13.8 mg/dL (ref 0.0–40.0)

## 2016-09-29 NOTE — Progress Notes (Signed)
09/29/2016  CC:  Chief Complaint  Patient presents with  . Hospitalization Follow-up    TCM    Patient is a 66 y.o. Caucasian male who presents for  hospital follow up, specifically Trans[facility] Care Services face-to-face visit. Dates hospitalized: 8/15-8/17, 2018. Days since d/c from hospital: 6 Patient was discharged from hospital to home. Reason for admission to hospital: symptomatic anemia, hemoccult +, need for transfusion. Date of interactive (phone) contact with patient and/or caregiver: 09/26/16  I have reviewed patient's discharge summary plus pertinent specific notes, labs, and imaging from the hospitalization.  This has been recurrent, has had multiple hospitalizations, 13 U pRBCs total, 3 units with this most recent hospitalization.  Has had full endoscopic evaluation w/out revelation of site to explain bleeding.  This hospitalization he had another EGD by Dr. [name] and this showed mild portal hypertensive gastropathy with a few small gastric erosions w/out active bleeding or stigmata of recent bleeding.  NOT the explanation for pt's GI blood loss.  No varices were seen. Has appt with GI specialist at [facility] 10/12/16 for special procedure: spiral enteroscopy-- to further work this up.  W/u for potential hemolytic component to this anemia was done by hematology and they felt this was not a likely dx/contributing factor. Hb at presentation to hosp this time was 6.1. D/c hemoglobin on 09/23/16 was 8.1.  He had a recent CBC at oncologist's (Dr. Irene Limbo) on 09/27/16 that showed Hb 8.9.    Feeling much better--energy back up, even mowed a bit yesterday.  Has not seen ANY black/tarry stool since d/c home from hospital.  Hypercholesterolemia: Dr. Claiborne Billings is trying to get pt approved/started on Repatha, so I've been asked to draw a fasting lipid panel today and forward results to Dr. Claiborne Billings.  Medication reconciliation was done today and patient is taking meds as recommended by discharging  hospitalist/specialist.    PMH:  Past Medical History:  Diagnosis Date  . AAA (abdominal aortic aneurysm) ([place]) 03/2014   3.2 cm on MR abd.  F/u aortic u/s 06/2014 showed 3.0 x 3.1 cm AAA: recheck 2 yrs recommended (followed by cardiologist)  . Anemia   . Bilateral renal cysts    simple (03/2014 MRI)  . CAD (coronary artery disease)   . Cholelithiases 2018   asymptomatic  . Chronic diastolic heart failure ([place])   . Cirrhosis ([place]) 05/2016   secondary to NASH; most recent u/s abd 07/2016 showed splenomegaly.  Hx of portal HTN changes with mild ascites and splenomegaly.  . Diabetes mellitus with complication ([place]) 54/6503   A1c 6.8%  . History of blood transfusion 2018 X 4 dates   "low blood" (09/21/2016)  . Hyperlipemia, mixed    elevated LFTs when on statins.    . Hypertension    Cr bump 04/01/16 so I changed benicar-hct to benicar plain and added amlodipine 5 mg.  . Iron deficiency anemia 05/2016   Acute blood loss anemia: hospitalized, required transfusion x 3 U: colonoscopy and capsule study unrevealing.  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  W/u for ? hemolytic anemia to be pursued by hematologist as outpt.  Appt with [facility] Cristi Loron at [facility] 09/13/16.  . Microscopic hematuria    Eval unremarkable by Dr. Eulogio Ditch.  [name] NASH (nonalcoholic steatohepatitis)    Fatty liver on MR abd 03/2014, + hx of elevated transaminases and bili: followed by Dr. [name].  . Obesity   . Spleen  enlarged     PSH:  Past Surgical History:  Procedure Laterality Date  . CARDIAC CATHETERIZATION    . CARDIOVASCULAR STRESS TEST  01/17/2012; 05/04/16   Normal stress nuclear study x 2 (2018--Normal perfusion. LVEF 66% with normal wall motion. This is a low risk study).  . CERVICAL DISCECTOMY  1992  . COLONOSCOPY N/A 05/27/2016   No site/explanation for blood loss found.  Erythematous mucosa in cecum and ascending  colon--Cecal bx: normal.  Procedure: COLONOSCOPY;  Surgeon: Carol Ada, MD;  Location: [facility] ENDOSCOPY;  Service: Endoscopy;  Laterality: N/A;  . COLONOSCOPY W/ POLYPECTOMY  09/10/2014   Polypectomy x 2: recall 5 yrs (Dr. [name]).  . CORONARY ANGIOPLASTY    . CORONARY ARTERY BYPASS GRAFT  03/2006  . ENTEROSCOPY N/A 07/31/2016   Procedure: ENTEROSCOPY;  Surgeon: Ladene Artist, MD;  Location: [facility] ENDOSCOPY;  Service: Endoscopy;  Laterality: N/A;  . ESOPHAGOGASTRODUODENOSCOPY N/A 05/25/2016   No site/explanation for blood loss found.  Procedure: ESOPHAGOGASTRODUODENOSCOPY (EGD);  Surgeon: Juanita Craver, MD;  Location: [facility] ENDOSCOPY;  Service: Endoscopy;  Laterality: N/A;  . ESOPHAGOGASTRODUODENOSCOPY N/A 09/23/2016   Procedure: ESOPHAGOGASTRODUODENOSCOPY (EGD);  Surgeon: Carol Ada, MD;  Location: [facility];  Service: Endoscopy;  Laterality: N/A;  . GIVENS CAPSULE STUDY N/A 05/27/2016   Procedure: GIVENS CAPSULE STUDY;  Surgeon: Carol Ada, MD;  Location: [facility];  Service: Endoscopy;  Laterality: N/A;  . NECK SURGERY  1991  . TRANSTHORACIC ECHOCARDIOGRAM  05/25/2016   EF 60%, normal wall motion, grd II DD, mild aortic stenosis, dilated aortic root and ascending aorta.  [name] VASECTOMY  1977    MEDS:  Outpatient Medications Prior to Visit  Medication Sig Dispense Refill  . cholecalciferol (VITAMIN D) 1000 units tablet Take 1,000 Units by mouth daily.    [name] docusate sodium (COLACE) 100 MG capsule Take 100 mg by mouth 2 (two) times daily as needed for mild constipation (1 - 2 tabs as needed).    . fluticasone (FLONASE) 50 MCG/ACT nasal spray Place 2 sprays into both nostrils daily. (Patient taking differently: Place 2 sprays into both nostrils daily as needed for allergies. ) 16 g 6  . iron polysaccharides (NIFEREX) 150 MG capsule Take 1 capsule (150 mg total) by mouth daily. 30 capsule 6  . metoprolol tartrate (LOPRESSOR) 25 MG tablet Take 0.5 tablets (12.5 mg total) by mouth 2 (two) times  daily. 60 tablet 6  . omega-3 acid ethyl esters (LOVAZA) 1 g capsule TAKE 2 CAPSULES BY MOUTH ONCE DAILY (Patient taking differently: TAKE 1 CAPSULES BY MOUTH TWICE DAILY) 180 capsule 3  . pantoprazole (PROTONIX) 40 MG tablet Take 1 tablet (40 mg total) by mouth 2 (two) times daily before a meal. 6 tablet 0  . phytonadione (VITAMIN K) 5 MG tablet Take 1 tablet (5 mg total) by mouth daily. 10 tablet 0  . potassium chloride SA (K-DUR,KLOR-CON) 20 MEQ tablet Take 1 tablet (20 mEq total) by mouth 2 (two) times daily. 60 tablet 6  . spironolactone (ALDACTONE) 25 MG tablet Take 1 tablet (25 mg total) by mouth daily. 30 tablet 6  . torsemide (DEMADEX) 20 MG tablet Take 2 tablets (40 mg total) by mouth daily. 60 tablet 6   No facility-administered medications prior to visit.    EXAM: BP 121/71 (BP Location: Left Arm, Patient Position: Sitting, Cuff Size: Normal)   Pulse 78   Temp 98.1 F (36.7 C) (Oral)   Resp 16   Ht 5' 8"  (1.727 m)   Wt  244 lb 12 oz (111 kg)   SpO2 97%   BMI 37.21 kg/m    Pertinent labs/imaging Lab Results  Component Value Date   WBC 5.8 09/27/2016   HGB 8.9 (L) 09/27/2016   HCT 28.3 (L) 09/27/2016   MCV 82.7 09/27/2016   PLT 125 (L) 09/27/2016     Chemistry      Component Value Date/Time   NA 135 (L) 09/27/2016 1341   K 3.8 09/27/2016 1341   CL 104 09/23/2016 0512   CO2 26 09/27/2016 1341   BUN 11.4 09/27/2016 1341   CREATININE 1.2 09/27/2016 1341      Component Value Date/Time   CALCIUM 9.4 09/27/2016 1341   ALKPHOS 91 09/27/2016 1341   AST 40 (H) 09/27/2016 1341   ALT 34 09/27/2016 1341   BILITOT 3.97 (HH) 09/27/2016 1341     Lab Results  Component Value Date   HGBA1C 5.4 06/29/2016    ASSESSMENT/PLAN:  1) Recurrent symptomatic anemia secondary to chronic GI blood loss, GI site of bleeding not able to be identified despite extensive endoscopic survey.   Pt denies melena since d/c from hosp when his Hb was 8.1.  F/u Hb on 09/27/16 was 8.9. He was  started on oral vit K and his iron pills were d/c'd when he followed up with Dr. Irene Limbo (hematology) on 09/27/16. Dr. Irene Limbo will be checking his Hb again on 10/06/16 to see if pt is in need of another transfusion prior to going to see the GI specialist at [facility] on 10/12/16.  2) Mixed hyperlipidemia: FLP drawn today, will forward results to Dr. Claiborne Billings --prep for possible start of PCSK9 inhibitor.  Medical decision making of moderate complexity was utilized today.  An After Visit Summary was printed and given to the patient.  FOLLOW UP:  4 mo, routine chronic illness f/u  Signed:  Crissie Sickles, MD           09/29/2016

## 2016-09-30 ENCOUNTER — Encounter: Payer: Self-pay | Admitting: *Deleted

## 2016-10-01 ENCOUNTER — Other Ambulatory Visit: Payer: Self-pay | Admitting: Pharmacist Clinician (PhC)/ Clinical Pharmacy Specialist

## 2016-10-01 MED ORDER — EVOLOCUMAB 140 MG/ML ~~LOC~~ SOAJ: 140.0000 mg | SUBCUTANEOUS | 12 refills | Status: DC

## 2016-10-03 NOTE — Progress Notes (Signed)
HEMATOLOGY/ONCOLOGY CLINIC NOTE  Date of Service: .09/27/2016  Patient Care Team: Tammi Sou, MD as PCP - General (Family Medicine) Juanita Craver, MD as Consulting Physician (Gastroenterology) Franchot Gallo, MD as Consulting Physician (Urology) Troy Sine, MD as Consulting Physician (Cardiology)  CHIEF COMPLAINTS/PURPOSE OF CONSULTATION:  Chronic blood loss Anemia from GI bleeding.  HISTORY OF PRESENTING ILLNESS:   William [name] is a wonderful 66 y.o. male who has been referred to [place] by Dr .Anitra Lauth, Adrian Blackwater, MD for evaluation and management of Anemia.  Patient has a history of extensive medical comorbidities including liver cirrhosis related to [facility] with portal hypertension, esophageal varices and portal hypertensive gastropathy and splenomegaly, iron deficiency anemia, obesity, diabetes, AAA.  Patient was admitted in April 2018 with acute blood loss anemia and required transfusion of multiple units of PRBCs with iron profile suggestive of iron deficiency. No overt evidence of hemolysis noted. He had an EGD and colonoscopy that did not show any overt bleeding. Capsule endoscopy was unrevealing but the capsule past and only 27 minutes.  He follows with Dr. [name] who is his gastric oncologist.  Patient was again readmitted in June 2018 with symptomatically anemia and hemoglobin of 6.3. He underwent 4 units of PRBC transfusions again and was sent out on PPI and iron supplementation with the plan to follow-up with Dr. [name].  He was also given hematology referral. He had a repeat ultrasound of the abdomen on 07/29/2016 which showed significant increase in splenomegaly in 2 months suggesting significant portal hypertension versus some element of splenic sequestration.  He continues to note intermittent black stools.  Hemoglobin is stable and improved today and is up to 11.1. Patient notes he feels better after his transfusions. Has not noted lower GI bleeding at this  time. We discuss any other workup to rule out less likely other possibilities of his anemia.  INTERVAL HISTORY  Patient is here for follow-up of his chornic blood loss anemia. He was readmitted to the hospital on 8/15 and received 3 units of PRBC. He has been scheduled for enteroscopy at [place] on 10/12/2016. Hgn today is 8.9. We discussed and decided to electively transfuse him at the end of aug to ensure adequate hgb levels for the planned procedure. Given his coagulopathy from liver cirrhosis he was also given prescription for vit k to attempt to correct his liver disease related coagulopathy. No overt GI bleeding at this time.  MEDICAL HISTORY:  Past Medical History:  Diagnosis Date  . AAA (abdominal aortic aneurysm) ([place]) 03/2014   3.2 cm on MR abd.  F/u aortic u/s 06/2014 showed 3.0 x 3.1 cm AAA: recheck 2 yrs recommended (followed by cardiologist)  . Anemia   . Bilateral renal cysts    simple (03/2014 MRI)  . CAD (coronary artery disease)   . Cholelithiases 2018   asymptomatic  . Chronic diastolic heart failure ([place])   . Cirrhosis ([place]) 05/2016   secondary to NASH; most recent u/s abd 07/2016 showed splenomegaly.  Hx of portal HTN changes with mild ascites and splenomegaly.  . Diabetes mellitus with complication ([place]) 10/4074   A1c 6.8%  . History of blood transfusion 2018 X 4 dates   "low blood" (09/21/2016)  . Hyperlipemia, mixed    elevated LFTs when on statins.    . Hypertension    Cr bump 04/01/16 so I changed benicar-hct to benicar plain and added amlodipine 5 mg.  . Iron deficiency anemia 05/2016   Acute blood  loss anemia: hospitalized, required transfusion x 3 U: colonoscopy and capsule study unrevealing.  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  W/u for ? hemolytic anemia to be pursued by hematologist as outpt.  Appt with [facility] Cristi Loron at [facility] 09/13/16.  . Microscopic hematuria     Eval unremarkable by Dr. Eulogio Ditch.  [name] NASH (nonalcoholic steatohepatitis)    Fatty liver on MR abd 03/2014, + hx of elevated transaminases and bili: followed by Dr. [name].  . Obesity   . Spleen enlarged     SURGICAL HISTORY: Past Surgical History:  Procedure Laterality Date  . CARDIAC CATHETERIZATION    . CARDIOVASCULAR STRESS TEST  01/17/2012; 05/04/16   Normal stress nuclear study x 2 (2018--Normal perfusion. LVEF 66% with normal wall motion. This is a low risk study).  . CERVICAL DISCECTOMY  1992  . COLONOSCOPY N/A 05/27/2016   No site/explanation for blood loss found.  Erythematous mucosa in cecum and ascending colon--Cecal bx: normal.  Procedure: COLONOSCOPY;  Surgeon: Carol Ada, MD;  Location: [facility] ENDOSCOPY;  Service: Endoscopy;  Laterality: N/A;  . COLONOSCOPY W/ POLYPECTOMY  09/10/2014   Polypectomy x 2: recall 5 yrs (Dr. [name]).  . CORONARY ANGIOPLASTY    . CORONARY ARTERY BYPASS GRAFT  03/2006  . ENTEROSCOPY N/A 07/31/2016   Procedure: ENTEROSCOPY;  Surgeon: Ladene Artist, MD;  Location: [facility] ENDOSCOPY;  Service: Endoscopy;  Laterality: N/A;  . ESOPHAGOGASTRODUODENOSCOPY N/A 05/25/2016   No site/explanation for blood loss found.  Procedure: ESOPHAGOGASTRODUODENOSCOPY (EGD);  Surgeon: Juanita Craver, MD;  Location: [facility] ENDOSCOPY;  Service: Endoscopy;  Laterality: N/A;  . ESOPHAGOGASTRODUODENOSCOPY N/A 09/23/2016   Procedure: ESOPHAGOGASTRODUODENOSCOPY (EGD);  Surgeon: Carol Ada, MD;  Location: [place];  Service: Endoscopy;  Laterality: N/A;  . GIVENS CAPSULE STUDY N/A 05/27/2016   Procedure: GIVENS CAPSULE STUDY;  Surgeon: Carol Ada, MD;  Location: [place];  Service: Endoscopy;  Laterality: N/A;  . NECK SURGERY  1991  . TRANSTHORACIC ECHOCARDIOGRAM  05/25/2016   EF 60%, normal wall motion, grd II DD, mild aortic stenosis, dilated aortic root and ascending aorta.  [name] VASECTOMY  1977    SOCIAL HISTORY: Social History   Social History  . Marital status: Married     Spouse name: N/A  . Number of children: N/A  . Years of education: N/A   Occupational History  . Not on file.   Social History Main Topics  . Smoking status: Former Smoker    Types: Cigarettes  . Smokeless tobacco: Never Used     Comment: QUIT I 1983  . Alcohol use No  . Drug use: No  . Sexual activity: Yes   Other Topics Concern  . Not on file   Social History Narrative   Married, 3 grown children, 3 GCs.   Educ: 10th grade.   Occupation: Retired Brewing technologist.   Tob: quit 1983, smoked about 20 pack-yr hx prior.   No alcohol.    FAMILY HISTORY: Family History  Problem Relation Age of Onset  . Hypertension Mother   . Cancer - Other Mother        liver cancer  . Heart disease Father   . Heart attack Father   . Cancer - Lung Father   . Diabetes Father   . Liver disease Sister   . Anemia Sister   . Heart disease Sister   . Heart attack Sister   . Heart disease Brother  CABG 20 YEARS AGO  . Hypertension Brother   . Mesothelioma Brother        HALF-BROTHER  . Cancer - Other Brother        CLL  . Cancer - Lung Brother        Mets to brain  . Cancer - Lung Sister        HALF-SISTER  . Liver disease Brother   . Heart disease Brother        CABG-2012  . Emphysema Maternal Grandfather   . Cancer - Other Paternal Grandmother        Stomach  . Heart attack Paternal Grandfather     ALLERGIES:  is allergic to statins.  MEDICATIONS:  Current Outpatient Prescriptions  Medication Sig Dispense Refill  . cholecalciferol (VITAMIN D) 1000 units tablet Take 1,000 Units by mouth daily.    [name] docusate sodium (COLACE) 100 MG capsule Take 100 mg by mouth 2 (two) times daily as needed for mild constipation (1 - 2 tabs as needed).    . fluticasone (FLONASE) 50 MCG/ACT nasal spray Place 2 sprays into both nostrils daily. (Patient taking differently: Place 2 sprays into both nostrils daily as needed for allergies. ) 16 g 6  . metoprolol tartrate (LOPRESSOR) 25 MG tablet  Take 0.5 tablets (12.5 mg total) by mouth 2 (two) times daily. 60 tablet 6  . omega-3 acid ethyl esters (LOVAZA) 1 g capsule TAKE 2 CAPSULES BY MOUTH ONCE DAILY (Patient taking differently: TAKE 1 CAPSULES BY MOUTH TWICE DAILY) 180 capsule 3  . pantoprazole (PROTONIX) 40 MG tablet Take 1 tablet (40 mg total) by mouth 2 (two) times daily before a meal. 6 tablet 0  . potassium chloride SA (K-DUR,KLOR-CON) 20 MEQ tablet Take 1 tablet (20 mEq total) by mouth 2 (two) times daily. 60 tablet 6  . spironolactone (ALDACTONE) 25 MG tablet Take 1 tablet (25 mg total) by mouth daily. 30 tablet 6  . torsemide (DEMADEX) 20 MG tablet Take 2 tablets (40 mg total) by mouth daily. 60 tablet 6  . Evolocumab (REPATHA SURECLICK) 937 MG/ML SOAJ Inject 140 mg into the skin every 14 (fourteen) days. 2 pen 12  . phytonadione (VITAMIN K) 5 MG tablet Take 1 tablet (5 mg total) by mouth daily. 10 tablet 0   No current facility-administered medications for this visit.     REVIEW OF SYSTEMS:    10 Point review of Systems was done is negative except as noted above.  PHYSICAL EXAMINATION: ECOG PERFORMANCE STATUS: 1 - Symptomatic but completely ambulatory  . Vitals:   09/27/16 1359  BP: (!) 108/41  Pulse: 73  Resp: 17  Temp: 98 F (36.7 C)  SpO2: 97%   Filed Weights   09/27/16 1359  Weight: 240 lb 4.8 oz (109 kg)   .Body mass index is 36.54 kg/m.  GENERAL:alert, in no acute distress and comfortable SKIN: no acute rashes, no significant lesions EYES: conjunctiva are pink and non-injected, sclera anicteric OROPHARYNX: MMM, no exudates, no oropharyngeal erythema or ulceration NECK: supple, no JVD LYMPH:  no palpable lymphadenopathy in the cervical, axillary or inguinal regions LUNGS: clear to auscultation b/l with normal respiratory effort HEART: regular rate & rhythm ABDOMEN:  normoactive bowel sounds , non tender, not distended. Extremity: no pedal edema PSYCH: alert & oriented x 3 with fluent  speech NEURO: no focal motor/sensory deficits  LABORATORY DATA:  I have reviewed the data as listed  . CBC Latest Ref Rng & Units 09/27/2016 09/23/2016 09/22/2016  WBC 4.0 - 10.3 10e3/uL 5.8 4.9 -  Hemoglobin 13.0 - 17.1 g/dL 8.9(L) 8.1(L) 8.7(L)  Hematocrit 38.4 - 49.9 % 28.3(L) 25.1(L) 27.1(L)  Platelets 140 - 400 10e3/uL 125(L) 116(L) -    . CMP Latest Ref Rng & Units 09/27/2016 09/23/2016 09/22/2016  Glucose 70 - 140 mg/dl 136 108(H) 106(H)  BUN 7.0 - 26.0 mg/dL 11.4 13 12   Creatinine 0.7 - 1.3 mg/dL 1.2 1.15 1.21  Sodium 136 - 145 mEq/L 135(L) 136 136  Potassium 3.5 - 5.1 mEq/L 3.8 4.1 4.3  Chloride 101 - 111 mmol/L - 104 105  CO2 22 - 29 mEq/L 26 23 26   Calcium 8.4 - 10.4 mg/dL 9.4 8.5(L) 8.4(L)  Total Protein 6.4 - 8.3 g/dL 6.8 - 5.6(L)  Total Bilirubin 0.20 - 1.20 mg/dL 3.97(HH) - 4.0(H)  Alkaline Phos 40 - 150 U/L 91 - 63  AST 5 - 34 U/L 40(H) - 34  ALT 0 - 55 U/L 34 - 27    . Lab Results  Component Value Date   IRON 32 (L) 09/27/2016   TIBC 473 (H) 09/27/2016   IRONPCTSAT 7 (L) 09/27/2016   (Iron and TIBC)  Lab Results  Component Value Date   FERRITIN 20 (L) 09/27/2016    RADIOGRAPHIC STUDIES: I have personally reviewed the radiological images as listed and agreed with the findings in the report. No results found.  ASSESSMENT & PLAN:   67 year old male with multiple medical comorbidities with Karlene Lineman with liver cirrhosis with portal hypertension, esophageal varices and portal hypertensive gastropathy with  #1 Acute on Chronic blood loss anemia.  Patient has had recurrent GI bleeding requiring 12-15 units of PRBCs. Between April 2018 and Aug 2018. Today's hemoglobin his 8.9 with a drop in his ferritin level that shows that his anemia is consistent with ongoing GI bleeding and not related to primary hematologic disorder.  Previous workup showed LDH within normal limits at 220 and suggests against overt hemolysis. Sedimentation rate within normal  limits. Coombs' test is negative. Myeloma panel and serum free light chains suggest no monoclonal paraproteinemia. PNH testing negative Slightly lower haptoglobin levels can be related to decreased hepatic production, low-level hemolysis due to transfusions or extravascular hemolysis due to splenic or hepatic sequestration.  Some element of anemia could also be from his liver disease. Doubt significance Spurr cell anemia given that his baseline hemoglobin levels are 11. When he is not bleeding.  Plan -vit K 71m po daily x 10 days to attempt to help correct coagulopathy associated with liver cirrhosis -Labs on 10/06/2016 PRBC transfusion x 2 units on 10/06/2016 to ensure adequate hgb levels [facility]. Will schedule for IV Injectafer 7542mweekly x 2 doses Continue f/u with PCP and GI (Dr MaCollene Mares RTC with Dr KaIrene Limbon 10/14/2016 with labs    All of the patients questions were answered with apparent satisfaction. The patient knows to call the clinic with any problems, questions or concerns.  I spent 20 minutes counseling the patient face to face. The total time spent in the appointment was 30 minutes and more than 50% was on counseling and direct patient cares and co-ordination and arranging his transfer to the ED    GaSullivan LoneD MSBriarcliffAHIVMS SCKindred Hospital BostonTMercy Regional Medical Centerematology/Oncology Physician CoMescalero Phs Indian Hospital(Office):       [phone] cell):  [phone]):           [phone]

## 2016-10-04 ENCOUNTER — Telehealth: Payer: Self-pay

## 2016-10-04 ENCOUNTER — Telehealth: Payer: Self-pay | Admitting: Hematology

## 2016-10-04 NOTE — Telephone Encounter (Signed)
Wife called and clarified conversation with samantha d/t husband not hearing well.

## 2016-10-04 NOTE — Telephone Encounter (Signed)
Scheduled appt per 8/28 sch message per Dawna Part - left message with appt date and time.

## 2016-10-04 NOTE — Telephone Encounter (Signed)
Discussed potential for two IV iron infusions if the pt is willing. Pt states that he is willing to have it done, but has a procedure at [facility] on 9/5 - endoscopy based on pt explanation. His doctor at [facility] told him to stop iron pill for 7 days prior to procedure. Unsure if it would be okay to have IV iron treatment prior to procedure. Will discuss with Dr. Irene Limbo and get appt scheduled if okay to proceed. If not, will schedule post-procedure. Pt stated, "okay to leave a message on my answering machine if I am not home."

## 2016-10-04 NOTE — Telephone Encounter (Signed)
Dr. Irene Limbo okay to postpone IV iron until after endoscopy on 9/5. Scheduling message sent for two IV iron infusions 1 week apart.

## 2016-10-05 ENCOUNTER — Ambulatory Visit (HOSPITAL_COMMUNITY)
Admission: RE | Admit: 2016-10-05 | Discharge: 2016-10-05 | Disposition: A | Discharge status: Home / Self Care | Payer: 59 | Source: Ambulatory Visit | Attending: Hematology | Admitting: Hematology

## 2016-10-05 DIAGNOSIS — D500 Iron deficiency anemia secondary to blood loss (chronic): Secondary | ICD-10-CM

## 2016-10-05 DIAGNOSIS — D649 Anemia, unspecified: Secondary | ICD-10-CM | POA: Insufficient documentation

## 2016-10-05 DIAGNOSIS — K921 Melena: Secondary | ICD-10-CM

## 2016-10-05 DIAGNOSIS — D689 Coagulation defect, unspecified: Secondary | ICD-10-CM

## 2016-10-05 MED FILL — TORSEMIDE 20 MG TABLET: 20 | 30 days supply | Qty: 60 | Fill #3

## 2016-10-05 MED FILL — SPIRONOLACTONE 25 MG TABLET: 25 | 30 days supply | Qty: 30 | Fill #3

## 2016-10-06 ENCOUNTER — Ambulatory Visit (HOSPITAL_BASED_OUTPATIENT_CLINIC_OR_DEPARTMENT_OTHER): Payer: 59

## 2016-10-06 ENCOUNTER — Other Ambulatory Visit: Payer: Self-pay | Admitting: *Deleted

## 2016-10-06 DIAGNOSIS — K7581 Nonalcoholic steatohepatitis (NASH): Secondary | ICD-10-CM

## 2016-10-06 DIAGNOSIS — D649 Anemia, unspecified: Secondary | ICD-10-CM

## 2016-10-06 DIAGNOSIS — D696 Thrombocytopenia, unspecified: Secondary | ICD-10-CM

## 2016-10-06 LAB — CBC & DIFF AND RETIC
BASO%: 0.8 % (ref 0.0–2.0)
Basophils Absolute: 0.1 10*3/uL (ref 0.0–0.1)
EOS ABS: 0.5 10*3/uL (ref 0.0–0.5)
EOS%: 8.1 % — ABNORMAL HIGH (ref 0.0–7.0)
HCT: 20.8 % — ABNORMAL LOW (ref 38.4–49.9)
HEMOGLOBIN: 6.3 g/dL — AB (ref 13.0–17.1)
Immature Retic Fract: 20.7 % — ABNORMAL HIGH (ref 3.00–10.60)
LYMPH%: 18.1 % (ref 14.0–49.0)
MCH: 24.9 pg — AB (ref 27.2–33.4)
MCHC: 30.3 g/dL — AB (ref 32.0–36.0)
MCV: 82.2 fL (ref 79.3–98.0)
MONO#: 1.1 10*3/uL — AB (ref 0.1–0.9)
MONO%: 19.3 % — ABNORMAL HIGH (ref 0.0–14.0)
NEUT#: 3.2 10*3/uL (ref 1.5–6.5)
NEUT%: 53.7 % (ref 39.0–75.0)
NRBC: 0 % (ref 0–0)
Platelets: 114 10*3/uL — ABNORMAL LOW (ref 140–400)
RBC: 2.53 10*6/uL — AB (ref 4.20–5.82)
RDW: 16.6 % — ABNORMAL HIGH (ref 11.0–14.6)
RETIC %: 3.59 % — AB (ref 0.80–1.80)
Retic Ct Abs: 90.83 10*3/uL (ref 34.80–93.90)
WBC: 5.9 10*3/uL (ref 4.0–10.3)
lymph#: 1.1 10*3/uL (ref 0.9–3.3)

## 2016-10-06 LAB — PROTIME-INR
INR: 1.4 — AB (ref 2.00–3.50)
PROTIME: 16.8 s — AB (ref 10.6–13.4)

## 2016-10-06 LAB — TECHNOLOGIST REVIEW

## 2016-10-06 LAB — PREPARE RBC (CROSSMATCH)

## 2016-10-06 MED ORDER — SODIUM CHLORIDE 0.9 % IV SOLN
250.0000 mL | Freq: Once | INTRAVENOUS | Status: AC
  Administered 2016-10-06: 250 mL via INTRAVENOUS

## 2016-10-06 MED ORDER — DIPHENHYDRAMINE HCL 25 MG PO CAPS
ORAL_CAPSULE | ORAL | Status: AC
  Filled 2016-10-06: qty 1

## 2016-10-06 MED ORDER — ACETAMINOPHEN 325 MG PO TABS
650.0000 mg | ORAL_TABLET | Freq: Once | ORAL | Status: AC
  Administered 2016-10-06: 650 mg via ORAL

## 2016-10-06 MED ORDER — ACETAMINOPHEN 325 MG PO TABS
ORAL_TABLET | ORAL | Status: AC
  Filled 2016-10-06: qty 2

## 2016-10-06 MED ORDER — DIPHENHYDRAMINE HCL 25 MG PO CAPS
25.0000 mg | ORAL_CAPSULE | Freq: Once | ORAL | Status: AC
  Administered 2016-10-06: 25 mg via ORAL

## 2016-10-06 NOTE — Patient Instructions (Signed)

## 2016-10-07 LAB — TYPE AND SCREEN
ABO/RH(D): O POS
ANTIBODY SCREEN: NEGATIVE
UNIT DIVISION: 0
Unit division: 0

## 2016-10-07 LAB — BPAM RBC
BLOOD PRODUCT EXPIRATION DATE: 201810032359
Blood Product Expiration Date: 201810032359
ISSUE DATE / TIME: 201808301342
ISSUE DATE / TIME: 201808301342
UNIT TYPE AND RH: 5100
Unit Type and Rh: 5100

## 2016-10-07 LAB — APTT: aPTT: 29 s (ref 24–33)

## 2016-10-08 HISTORY — PX: SMALL BOWEL ENTEROSCOPY: SHX2415

## 2016-10-10 ENCOUNTER — Encounter: Payer: Self-pay | Admitting: Family Medicine

## 2016-10-11 ENCOUNTER — Encounter: Payer: Self-pay | Admitting: Cardiovascular Disease

## 2016-10-11 MED ORDER — PANTOPRAZOLE SODIUM 40 MG PO TBEC: 40.0000 mg | DELAYED_RELEASE_TABLET | Freq: Two times a day (BID) | ORAL | 3 refills | Status: DC

## 2016-10-11 MED FILL — OMEGA-3 ETHYL ESTERS 1 GM C: 1 | 90 days supply | Qty: 180 | Fill #2

## 2016-10-11 MED FILL — POTASSIUM CL ER 20 MEQ TABL: 20 | 30 days supply | Qty: 60 | Fill #3

## 2016-10-11 MED FILL — PANTOPRAZOLE SOD DR 40 MG T: 40 | 30 days supply | Qty: 60 | Fill #0

## 2016-10-11 NOTE — Telephone Encounter (Signed)
Pantoprazole eRx'd as per pt request.

## 2016-10-11 NOTE — Telephone Encounter (Signed)
New medication started in hospital. Please advise if okay to refill. Thanks.

## 2016-10-12 ENCOUNTER — Other Ambulatory Visit: Payer: Self-pay

## 2016-10-12 DIAGNOSIS — K7581 Nonalcoholic steatohepatitis (NASH): Secondary | ICD-10-CM | POA: Diagnosis not present

## 2016-10-12 DIAGNOSIS — I8510 Secondary esophageal varices without bleeding: Secondary | ICD-10-CM | POA: Diagnosis not present

## 2016-10-12 DIAGNOSIS — K3189 Other diseases of stomach and duodenum: Secondary | ICD-10-CM | POA: Diagnosis not present

## 2016-10-12 DIAGNOSIS — K766 Portal hypertension: Secondary | ICD-10-CM | POA: Diagnosis not present

## 2016-10-12 DIAGNOSIS — I5032 Chronic diastolic (congestive) heart failure: Secondary | ICD-10-CM | POA: Diagnosis not present

## 2016-10-12 DIAGNOSIS — D1339 Benign neoplasm of other parts of small intestine: Secondary | ICD-10-CM | POA: Diagnosis not present

## 2016-10-12 DIAGNOSIS — D509 Iron deficiency anemia, unspecified: Secondary | ICD-10-CM | POA: Diagnosis not present

## 2016-10-12 DIAGNOSIS — I8500 Esophageal varices without bleeding: Secondary | ICD-10-CM | POA: Diagnosis not present

## 2016-10-12 DIAGNOSIS — I110 Hypertensive heart disease with heart failure: Secondary | ICD-10-CM | POA: Diagnosis not present

## 2016-10-12 DIAGNOSIS — K6389 Other specified diseases of intestine: Secondary | ICD-10-CM | POA: Diagnosis not present

## 2016-10-12 DIAGNOSIS — K7469 Other cirrhosis of liver: Secondary | ICD-10-CM | POA: Diagnosis not present

## 2016-10-13 NOTE — Progress Notes (Signed)
HEMATOLOGY/ONCOLOGY CLINIC NOTE  Date of Service: 10/14/16  Patient Care Team: Tammi Sou, MD as PCP - General (Family Medicine) Juanita Craver, MD as Consulting Physician (Gastroenterology) Franchot Gallo, MD as Consulting Physician (Urology) Troy Sine, MD as Consulting Physician (Cardiology) Brunetta Genera, MD as Consulting Physician (Hematology)  CHIEF COMPLAINTS/PURPOSE OF CONSULTATION:  Chronic blood loss Anemia from GI bleeding.  HISTORY OF PRESENTING ILLNESS:   William [name] is a wonderful 66 y.o. male who has been referred to [place] by Dr .Anitra Lauth, Adrian Blackwater, MD for evaluation and management of Anemia.  Patient has a history of extensive medical comorbidities including liver cirrhosis related to [facility] with portal hypertension, esophageal varices and portal hypertensive gastropathy and splenomegaly, iron deficiency anemia, obesity, diabetes, AAA.  Patient was admitted in April 2018 with acute blood loss anemia and required transfusion of multiple units of PRBCs with iron profile suggestive of iron deficiency. No overt evidence of hemolysis noted. He had an EGD and colonoscopy that did not show any overt bleeding. Capsule endoscopy was unrevealing but the capsule past and only 27 minutes.  He follows with Dr. [name] who is his gastric oncologist.  Patient was again readmitted in June 2018 with symptomatically anemia and hemoglobin of 6.3. He underwent 4 units of PRBC transfusions again and was sent out on PPI and iron supplementation with the plan to follow-up with Dr. [name].  He was also given hematology referral. He had a repeat ultrasound of the abdomen on 07/29/2016 which showed significant increase in splenomegaly in 2 months suggesting significant portal hypertension versus some element of splenic sequestration.  He continues to note intermittent black stools.  Hemoglobin is stable and improved today and is up to 11.1. Patient notes he feels better  after his transfusions. Has not noted lower GI bleeding at this time. We discuss any other workup to rule out less likely other possibilities of his anemia.  INTERVAL HISTORY  Patient is here for f/u of his anemia that is primarily related to ongoing issues with GI bleeding. Of note since the patient last visit, He underwent a blood transfusion on 10/06/2016 where he received 2 units for hgb of  6.3. He then underwent anterior grade double balloon enteroscopy on 10/12/2016 at [facility] which was performed by Dr. Gaspar Skeeters wild with results revealing: Grade 1 esophageal varices. Portal hypertensive gastropathy. Nl examined duodenum. Jejunal polyp(s). Biopsied. Clip was placed. The examined portion of the ileum was nl. No convincing bleeding source was detected on exam. Dr. [name] is awaiting pathology results from the biopsied polyps.   Patient is here today for follow-up of his chronic blood loss anemia. Pt is accompanied by his wife. He states that he is feeling weak today and reports that he has mild sore throat following the enteroscopy. Pt Hgb is 6.9 as of today (10/14/2016). He notes that he had diarrhea initially following the enteroscopy, but that has resolved. Pt wife notes that he is schedule to have IV iron infusion for 10/21/2016 and 10/28/2016. He states that he hasn't taken his diuretics for the past 2 days and noticed a recent weight gain.   On review of systems, he reports fatigue. He denies active bleeding at this time. He reports resolved abdominal pain on yesterday. He reports resolved diarrhea. Denies watery stools or blood in stools.  He reports resolved fever on yesterday (TMAX 100.9).  We discussed that ongoing worsening of anemia is consistent with ongoing GI bleeding that will need to  be evaluated and monitored with PRBC transfusion and cannot be managed by [place] on an outpatient basis. He was encouraged to proceed to the ED for further evaluation and management and also be in touch with his  GI physician. If bleeding is noted to be brisk might need GI bleed scan or angiogram or capsule endoscopy to evaluate site of bleeding.   MEDICAL HISTORY:  Past Medical History:  Diagnosis Date  . AAA (abdominal aortic aneurysm) ([place]) 03/2014   3.2 cm on MR abd.  F/u aortic u/s 06/2014 showed 3.0 x 3.1 cm AAA: recheck 2 yrs recommended (followed by cardiologist)  . Anemia   . Bilateral renal cysts    simple (03/2014 MRI)  . CAD (coronary artery disease)   . Cholelithiases 2018   asymptomatic  . Chronic diastolic heart failure ([place])   . Cirrhosis ([place]) 05/2016   secondary to NASH; most recent u/s abd 07/2016 showed splenomegaly.  Hx of portal HTN changes with mild ascites and splenomegaly.  . Diabetes mellitus with complication ([place]) 53/9767   A1c 6.8%  . History of blood transfusion 2018 X 4 dates   "low blood" (09/21/2016)  . Hyperlipemia, mixed    elevated LFTs when on statins.    . Hypertension    Cr bump 04/01/16 so I changed benicar-hct to benicar plain and added amlodipine 5 mg.  . Iron deficiency anemia 05/2016   Acute blood loss anemia: hospitalized, required transfusion x 3 U: colonoscopy and capsule study unrevealing.  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  W/u for ? hemolytic anemia to be pursued by hematologist as outpt.  Appt with Dr. Cristi Loron at [facility] 09/13/16.  . Microscopic hematuria    Eval unremarkable by Dr. Eulogio Ditch.  [name] NASH (nonalcoholic steatohepatitis)    Fatty liver on MR abd 03/2014, + hx of elevated transaminases and bili: followed by Dr. [name].  . Obesity   . Spleen enlarged     SURGICAL HISTORY: Past Surgical History:  Procedure Laterality Date  . CARDIAC CATHETERIZATION    . CARDIOVASCULAR STRESS TEST  01/17/2012; 05/04/16   Normal stress nuclear study x 2 (2018--Normal perfusion. LVEF 66% with normal wall motion. This is a low risk study).  . CERVICAL  DISCECTOMY  1992  . COLONOSCOPY N/A 05/27/2016   No site/explanation for blood loss found.  Erythematous mucosa in cecum and ascending colon--Cecal bx: normal.  Procedure: COLONOSCOPY;  Surgeon: Carol Ada, MD;  Location[facility] ENDOSCOPY;  Service: Endoscopy;  Laterality: N/A;  . COLONOSCOPY W/ POLYPECTOMY  09/10/2014   Polypectomy x 2: recall 5 yrs (Dr. [name]).  . CORONARY ANGIOPLASTY    . CORONARY ARTERY BYPASS GRAFT  03/2006  . ENTEROSCOPY N/A 07/31/2016   Procedure: ENTEROSCOPY;  Surgeon: Ladene Artist, MD;  Location[facility] ENDOSCOPY;  Service: Endoscopy;  Laterality: N/A;  . ESOPHAGOGASTRODUODENOSCOPY N/A 05/25/2016   No site/explanation for blood loss found.  Procedure: ESOPHAGOGASTRODUODENOSCOPY (EGD);  Surgeon: Juanita Craver, MD;  Location: [facility] ENDOSCOPY;  Service: Endoscopy;  Laterality: N/A;  . ESOPHAGOGASTRODUODENOSCOPY N/A 09/23/2016   Procedure: ESOPHAGOGASTRODUODENOSCOPY (EGD);  Surgeon: Carol Ada, MD;  Location: [place];  Service: Endoscopy;  Laterality: N/A;  . GIVENS CAPSULE STUDY N/A 05/27/2016   Procedure: GIVENS CAPSULE STUDY;  Surgeon: Carol Ada, MD;  Location: [place];  Service: Endoscopy;  Laterality: N/A;  . NECK SURGERY  1991  . TRANSTHORACIC ECHOCARDIOGRAM  05/25/2016   EF 60%, normal  wall motion, grd II DD, mild aortic stenosis, dilated aortic root and ascending aorta.  [name] VASECTOMY  1977    SOCIAL HISTORY: Social History   Social History  . Marital status: Married    Spouse name: N/A  . Number of children: N/A  . Years of education: N/A   Occupational History  . Not on file.   Social History Main Topics  . Smoking status: Former Smoker    Types: Cigarettes  . Smokeless tobacco: Never Used     Comment: QUIT I 1983  . Alcohol use No  . Drug use: No  . Sexual activity: Yes   Other Topics Concern  . Not on file   Social History Narrative   Married, 3 grown children, 3 GCs.   Educ: 10th grade.   Occupation: Retired Brewing technologist.   Tob:  quit 1983, smoked about 20 pack-yr hx prior.   No alcohol.    FAMILY HISTORY: Family History  Problem Relation Age of Onset  . Hypertension Mother   . Cancer - Other Mother        liver cancer  . Heart disease Father   . Heart attack Father   . Cancer - Lung Father   . Diabetes Father   . Liver disease Sister   . Anemia Sister   . Heart disease Sister   . Heart attack Sister   . Heart disease Brother        CABG 20 YEARS AGO  . Hypertension Brother   . Mesothelioma Brother        HALF-BROTHER  . Cancer - Other Brother        CLL  . Cancer - Lung Brother        Mets to brain  . Cancer - Lung Sister        HALF-SISTER  . Liver disease Brother   . Heart disease Brother        CABG-2012  . Emphysema Maternal Grandfather   . Cancer - Other Paternal Grandmother        Stomach  . Heart attack Paternal Grandfather     ALLERGIES:  is allergic to statins.  MEDICATIONS:  Current Outpatient Prescriptions  Medication Sig Dispense Refill  . cholecalciferol (VITAMIN D) 1000 units tablet Take 1,000 Units by mouth daily.    [name] docusate sodium (COLACE) 100 MG capsule Take 100 mg by mouth 2 (two) times daily as needed for mild constipation (1 - 2 tabs as needed).    . Evolocumab (REPATHA SURECLICK) 628 MG/ML SOAJ Inject 140 mg into the skin every 14 (fourteen) days. 2 pen 12  . fluticasone (FLONASE) 50 MCG/ACT nasal spray Place 2 sprays into both nostrils daily. (Patient taking differently: Place 2 sprays into both nostrils daily as needed for allergies. ) 16 g 6  . metoprolol tartrate (LOPRESSOR) 25 MG tablet Take 0.5 tablets (12.5 mg total) by mouth 2 (two) times daily. 60 tablet 6  . omega-3 acid ethyl esters (LOVAZA) 1 g capsule TAKE 2 CAPSULES BY MOUTH ONCE DAILY (Patient taking differently: TAKE 2 CAPSULES BY MOUTH DAILY) 180 capsule 3  . pantoprazole (PROTONIX) 40 MG tablet Take 1 tablet (40 mg total) by mouth 2 (two) times daily before a meal. 60 tablet 3  . potassium chloride  SA (K-DUR,KLOR-CON) 20 MEQ tablet Take 1 tablet (20 mEq total) by mouth 2 (two) times daily. 60 tablet 6  . spironolactone (ALDACTONE) 25 MG tablet Take 1 tablet (25 mg total) by mouth daily.  30 tablet 6  . torsemide (DEMADEX) 20 MG tablet Take 2 tablets (40 mg total) by mouth daily. 60 tablet 6  . acetaminophen (TYLENOL) 325 MG tablet Take 650 mg by mouth every 6 (six) hours as needed for fever.     No current facility-administered medications for this visit.     REVIEW OF SYSTEMS:    10 Point review of Systems was done is negative except as noted above.  PHYSICAL EXAMINATION:  ECOG PERFORMANCE STATUS: 1 - Symptomatic but completely ambulatory  . Vitals:   10/14/16 1147  BP: (!) 116/50  Pulse: 88  Resp: 17  Temp: 99.1 F (37.3 C)  SpO2: 96%   Filed Weights   10/14/16 1147  Weight: 247 lb 14.4 oz (112.4 kg)   .Body mass index is 37.69 kg/m.  GENERAL:alert, in no acute distress and comfortable SKIN: no acute rashes, no significant lesions EYES: conjunctiva are pink and non-injected, sclera anicteric OROPHARYNX: MMM, no exudates, no oropharyngeal erythema or ulceration NECK: supple, no JVD LYMPH:  no palpable lymphadenopathy in the cervical, axillary or inguinal regions LUNGS: clear to auscultation b/l with normal respiratory effort HEART: regular rate & rhythm ABDOMEN:  normoactive bowel sounds , non tender, not distended. Extremity: Trace pedal edema bilaterally.  PSYCH: alert & oriented x 3 with fluent speech NEURO: no focal motor/sensory deficits  LABORATORY DATA:  I have reviewed the data as listed  . CBC Latest Ref Rng & Units 10/14/2016 10/06/2016 09/27/2016  WBC 4.0 - 10.3 10e3/uL 6.1 5.9 5.8  Hemoglobin 13.0 - 17.1 g/dL 6.9(LL) 6.3(LL) 8.9(L)  Hematocrit 38.4 - 49.9 % 22.3(L) 20.8(L) 28.3(L)  Platelets 140 - 400 10e3/uL 106(L) 114(L) 125(L)    . CMP Latest Ref Rng & Units 09/27/2016 09/23/2016 09/22/2016  Glucose 70 - 140 mg/dl 136 108(H) 106(H)  BUN 7.0 -  26.0 mg/dL 11.4 13 12   Creatinine 0.7 - 1.3 mg/dL 1.2 1.15 1.21  Sodium 136 - 145 mEq/L 135(L) 136 136  Potassium 3.5 - 5.1 mEq/L 3.8 4.1 4.3  Chloride 101 - 111 mmol/L - 104 105  CO2 22 - 29 mEq/L 26 23 26   Calcium 8.4 - 10.4 mg/dL 9.4 8.5(L) 8.4(L)  Total Protein 6.4 - 8.3 g/dL 6.8 - 5.6(L)  Total Bilirubin 0.20 - 1.20 mg/dL 3.97(HH) - 4.0(H)  Alkaline Phos 40 - 150 U/L 91 - 63  AST 5 - 34 U/L 40(H) - 34  ALT 0 - 55 U/L 34 - 27    . Lab Results  Component Value Date   IRON 32 (L) 09/27/2016   TIBC 473 (H) 09/27/2016   IRONPCTSAT 7 (L) 09/27/2016   (Iron and TIBC)  Lab Results  Component Value Date   FERRITIN 20 (L) 09/27/2016    RADIOGRAPHIC STUDIES: I have personally reviewed the radiological images as listed and agreed with the findings in the report. No results found.  ASSESSMENT & PLAN:   66 year old male with multiple medical comorbidities with Karlene Lineman with liver cirrhosis with portal hypertension, esophageal varices and portal hypertensive gastropathy with  #1 Acute on Chronic blood loss anemia.  Patient has had recurrent GI bleeding requiring nearly 20 units of PRBCs. Between April 2018 and Aug 2018. Previously the patient's hemoglobin was noted to be 8.9 with a drop in his ferritin level that shows that his anemia is consistent with ongoing GI bleeding and not related to primary hematologic disorder. -Previously patient hemoglobin as of 10/06/2016 was 6.3 and critical and hgb today is 6.9 despite transfusion of  2 units of PRBCs on 10/06/2016.  -Patient hemoglobin as of 10/14/2016 was 6.9 and  With significant symptomsl. This was discussed with the patient and his wife during today's visit.   Previous workup showed LDH within normal limits at 220 and suggests against overt hemolysis. Sedimentation rate within normal limits. Coombs' test is negative. Myeloma panel and serum free light chains suggest no monoclonal paraproteinemia. PNH testing negative Slightly lower  haptoglobin levels can be related to decreased hepatic production, low-level hemolysis due to transfusions or extravascular hemolysis due to splenic or hepatic sequestration.  Some element of anemia could also be from his liver disease.no evidence of Spurr cell anemia and unlikely given that his baseline hemoglobin levels are 11. When he is not bleeding. PLAN -we discussed that his ongoing GI bleeding needing inpatient monitoring and transfusion and does not represent primary hematologic disease and cannot be managed as outpatient from a hematology perspective unless source of GI bleeding has been identified and is controlled. -his GI bleeding is too brisk to be managed with prn frequent IV Iron in the clinic setting. -he was sent to ED for PRBC transfusion, GI w/u and mx.  -consider RBC tagged scan -consider Vt K for liver cirrhosis  Related coagulopathy. -could given IV Feraheme 559m in the hospital -he needs to have a frank goals of care discussion with his PCP and GI MD regarding next steps in his GI bleeding and liver cirrhosis related cares. Outpatient hematology f/u for IV iron and prn PRBC transfusion is not a tenable plan and will continue to lead to recurrent hospitalization without clear endpoint.  All of the patients questions were answered with apparent satisfaction. The patient knows to call the clinic with any problems, questions or concerns.  I spent 20 minutes counseling the patient face to face. The total time spent in the appointment was 30 minutes and more than 50% was on counseling and direct patient cares and co-ordination and arranging his transfer to the ED    GSullivan LoneMD MMinsterAAHIVMS SSayre Memorial HospitalCEye Surgery Center Of West Georgia IncorporatedHematology/Oncology Physician [place] (Office):       [phone](Work cell):  [phone](Fax):           [phone] This document serves as a record of services personally performed by GSullivan Lone MD. It was created on her behalf by SSteva Colder a  trained medical scribe. The creation of this record is based on the scribe's personal observations and the provider's statements to them. This document has been checked and approved by the attending provider.

## 2016-10-14 ENCOUNTER — Encounter: Payer: Self-pay | Admitting: Hematology

## 2016-10-14 ENCOUNTER — Other Ambulatory Visit: Payer: Self-pay

## 2016-10-14 ENCOUNTER — Emergency Department (HOSPITAL_COMMUNITY): Payer: 59

## 2016-10-14 ENCOUNTER — Ambulatory Visit (HOSPITAL_BASED_OUTPATIENT_CLINIC_OR_DEPARTMENT_OTHER): Payer: 59 | Admitting: Hematology

## 2016-10-14 ENCOUNTER — Encounter (HOSPITAL_COMMUNITY): Payer: Self-pay

## 2016-10-14 ENCOUNTER — Other Ambulatory Visit (HOSPITAL_BASED_OUTPATIENT_CLINIC_OR_DEPARTMENT_OTHER): Payer: 59

## 2016-10-14 ENCOUNTER — Observation Stay (HOSPITAL_COMMUNITY)
Admission: EM | Admit: 2016-10-14 | Discharge: 2016-10-16 | Disposition: A | Discharge status: Home / Self Care | Payer: 59 | Attending: Internal Medicine | Admitting: Internal Medicine

## 2016-10-14 VITALS — BP 116/50 | HR 88 | Temp 99.1°F | Resp 17 | Wt 247.9 lb

## 2016-10-14 DIAGNOSIS — I2510 Atherosclerotic heart disease of native coronary artery without angina pectoris: Secondary | ICD-10-CM | POA: Diagnosis not present

## 2016-10-14 DIAGNOSIS — I714 Abdominal aortic aneurysm, without rupture: Secondary | ICD-10-CM | POA: Insufficient documentation

## 2016-10-14 DIAGNOSIS — D500 Iron deficiency anemia secondary to blood loss (chronic): Secondary | ICD-10-CM | POA: Diagnosis not present

## 2016-10-14 DIAGNOSIS — R195 Other fecal abnormalities: Secondary | ICD-10-CM | POA: Diagnosis not present

## 2016-10-14 DIAGNOSIS — E1122 Type 2 diabetes mellitus with diabetic chronic kidney disease: Secondary | ICD-10-CM | POA: Diagnosis not present

## 2016-10-14 DIAGNOSIS — D689 Coagulation defect, unspecified: Secondary | ICD-10-CM

## 2016-10-14 DIAGNOSIS — I4510 Unspecified right bundle-branch block: Secondary | ICD-10-CM | POA: Diagnosis not present

## 2016-10-14 DIAGNOSIS — Z808 Family history of malignant neoplasm of other organs or systems: Secondary | ICD-10-CM | POA: Insufficient documentation

## 2016-10-14 DIAGNOSIS — K7581 Nonalcoholic steatohepatitis (NASH): Secondary | ICD-10-CM | POA: Diagnosis not present

## 2016-10-14 DIAGNOSIS — I5033 Acute on chronic diastolic (congestive) heart failure: Secondary | ICD-10-CM | POA: Diagnosis present

## 2016-10-14 DIAGNOSIS — E8881 Metabolic syndrome: Secondary | ICD-10-CM | POA: Diagnosis not present

## 2016-10-14 DIAGNOSIS — K7460 Unspecified cirrhosis of liver: Secondary | ICD-10-CM | POA: Diagnosis present

## 2016-10-14 DIAGNOSIS — D649 Anemia, unspecified: Secondary | ICD-10-CM | POA: Diagnosis not present

## 2016-10-14 DIAGNOSIS — I8500 Esophageal varices without bleeding: Secondary | ICD-10-CM | POA: Insufficient documentation

## 2016-10-14 DIAGNOSIS — K3189 Other diseases of stomach and duodenum: Secondary | ICD-10-CM | POA: Diagnosis not present

## 2016-10-14 DIAGNOSIS — Z8249 Family history of ischemic heart disease and other diseases of the circulatory system: Secondary | ICD-10-CM | POA: Insufficient documentation

## 2016-10-14 DIAGNOSIS — D62 Acute posthemorrhagic anemia: Secondary | ICD-10-CM | POA: Diagnosis not present

## 2016-10-14 DIAGNOSIS — K766 Portal hypertension: Secondary | ICD-10-CM | POA: Diagnosis not present

## 2016-10-14 DIAGNOSIS — D696 Thrombocytopenia, unspecified: Secondary | ICD-10-CM | POA: Diagnosis not present

## 2016-10-14 DIAGNOSIS — Z832 Family history of diseases of the blood and blood-forming organs and certain disorders involving the immune mechanism: Secondary | ICD-10-CM | POA: Insufficient documentation

## 2016-10-14 DIAGNOSIS — I350 Nonrheumatic aortic (valve) stenosis: Secondary | ICD-10-CM | POA: Insufficient documentation

## 2016-10-14 DIAGNOSIS — I5032 Chronic diastolic (congestive) heart failure: Secondary | ICD-10-CM | POA: Insufficient documentation

## 2016-10-14 DIAGNOSIS — I130 Hypertensive heart and chronic kidney disease with heart failure and stage 1 through stage 4 chronic kidney disease, or unspecified chronic kidney disease: Secondary | ICD-10-CM | POA: Insufficient documentation

## 2016-10-14 DIAGNOSIS — M479 Spondylosis, unspecified: Secondary | ICD-10-CM | POA: Insufficient documentation

## 2016-10-14 DIAGNOSIS — Z806 Family history of leukemia: Secondary | ICD-10-CM | POA: Insufficient documentation

## 2016-10-14 DIAGNOSIS — N189 Chronic kidney disease, unspecified: Secondary | ICD-10-CM | POA: Insufficient documentation

## 2016-10-14 DIAGNOSIS — Z6836 Body mass index (BMI) 36.0-36.9, adult: Secondary | ICD-10-CM | POA: Insufficient documentation

## 2016-10-14 DIAGNOSIS — K922 Gastrointestinal hemorrhage, unspecified: Secondary | ICD-10-CM

## 2016-10-14 DIAGNOSIS — Z888 Allergy status to other drugs, medicaments and biological substances status: Secondary | ICD-10-CM | POA: Insufficient documentation

## 2016-10-14 DIAGNOSIS — Z800 Family history of malignant neoplasm of digestive organs: Secondary | ICD-10-CM | POA: Insufficient documentation

## 2016-10-14 DIAGNOSIS — Z86010 Personal history of colonic polyps: Secondary | ICD-10-CM | POA: Insufficient documentation

## 2016-10-14 DIAGNOSIS — Z951 Presence of aortocoronary bypass graft: Secondary | ICD-10-CM | POA: Diagnosis not present

## 2016-10-14 DIAGNOSIS — Z87891 Personal history of nicotine dependence: Secondary | ICD-10-CM | POA: Insufficient documentation

## 2016-10-14 DIAGNOSIS — Z7951 Long term (current) use of inhaled steroids: Secondary | ICD-10-CM | POA: Insufficient documentation

## 2016-10-14 DIAGNOSIS — R05 Cough: Secondary | ICD-10-CM | POA: Diagnosis not present

## 2016-10-14 DIAGNOSIS — N179 Acute kidney failure, unspecified: Secondary | ICD-10-CM | POA: Diagnosis present

## 2016-10-14 DIAGNOSIS — Z79899 Other long term (current) drug therapy: Secondary | ICD-10-CM | POA: Insufficient documentation

## 2016-10-14 DIAGNOSIS — I10 Essential (primary) hypertension: Secondary | ICD-10-CM | POA: Diagnosis not present

## 2016-10-14 DIAGNOSIS — E782 Mixed hyperlipidemia: Secondary | ICD-10-CM | POA: Insufficient documentation

## 2016-10-14 DIAGNOSIS — D509 Iron deficiency anemia, unspecified: Secondary | ICD-10-CM | POA: Diagnosis not present

## 2016-10-14 DIAGNOSIS — Z836 Family history of other diseases of the respiratory system: Secondary | ICD-10-CM | POA: Insufficient documentation

## 2016-10-14 DIAGNOSIS — E119 Type 2 diabetes mellitus without complications: Secondary | ICD-10-CM

## 2016-10-14 DIAGNOSIS — Z833 Family history of diabetes mellitus: Secondary | ICD-10-CM | POA: Insufficient documentation

## 2016-10-14 DIAGNOSIS — K921 Melena: Secondary | ICD-10-CM

## 2016-10-14 LAB — CBC WITH DIFFERENTIAL/PLATELET
Basophils Absolute: 0.1 K/uL (ref 0.0–0.1)
Basophils Relative: 1 %
Eosinophils Absolute: 0.3 K/uL (ref 0.0–0.7)
Eosinophils Relative: 4 %
HCT: 22.4 % — ABNORMAL LOW (ref 39.0–52.0)
Hemoglobin: 7.1 g/dL — ABNORMAL LOW (ref 13.0–17.0)
Lymphocytes Relative: 12 %
Lymphs Abs: 0.8 K/uL (ref 0.7–4.0)
MCH: 26.3 pg (ref 26.0–34.0)
MCHC: 31.7 g/dL (ref 30.0–36.0)
MCV: 83 fL (ref 78.0–100.0)
Monocytes Absolute: 1.6 K/uL — ABNORMAL HIGH (ref 0.1–1.0)
Monocytes Relative: 24 %
Neutro Abs: 3.7 K/uL (ref 1.7–7.7)
Neutrophils Relative %: 59 %
Platelets: 116 K/uL — ABNORMAL LOW (ref 150–400)
RBC: 2.7 MIL/uL — ABNORMAL LOW (ref 4.22–5.81)
RDW: 18 % — ABNORMAL HIGH (ref 11.5–15.5)
WBC: 6.5 K/uL (ref 4.0–10.5)

## 2016-10-14 LAB — URINALYSIS, ROUTINE W REFLEX MICROSCOPIC
Bilirubin Urine: NEGATIVE
GLUCOSE, UA: NEGATIVE mg/dL
Hgb urine dipstick: NEGATIVE
Ketones, ur: NEGATIVE mg/dL
LEUKOCYTES UA: NEGATIVE
Nitrite: NEGATIVE
PH: 5 (ref 5.0–8.0)
Protein, ur: NEGATIVE mg/dL
Specific Gravity, Urine: 1.019 (ref 1.005–1.030)

## 2016-10-14 LAB — COMPREHENSIVE METABOLIC PANEL
ALT: 19 U/L (ref 0–55)
ALT: 22 U/L (ref 17–63)
AST: 27 U/L (ref 5–34)
AST: 33 U/L (ref 15–41)
Albumin: 2.5 g/dL — ABNORMAL LOW (ref 3.5–5.0)
Albumin: 2.8 g/dL — ABNORMAL LOW (ref 3.5–5.0)
Alkaline Phosphatase: 93 U/L (ref 40–150)
Alkaline Phosphatase: 80 U/L (ref 38–126)
Anion Gap: 6 mEq/L (ref 3–11)
Anion gap: 7 (ref 5–15)
BILIRUBIN TOTAL: 3.47 mg/dL — AB (ref 0.20–1.20)
BUN: 12.9 mg/dL (ref 7.0–26.0)
BUN: 13 mg/dL (ref 6–20)
CALCIUM: 8.4 mg/dL — AB (ref 8.9–10.3)
CHLORIDE: 105 mmol/L (ref 101–111)
CO2: 24 meq/L (ref 22–29)
CO2: 24 mmol/L (ref 22–32)
CREATININE: 1.3 mg/dL (ref 0.7–1.3)
CREATININE: 1.18 mg/dL (ref 0.61–1.24)
Calcium: 8.4 mg/dL (ref 8.4–10.4)
Chloride: 104 mEq/L (ref 98–109)
EGFR: 56 mL/min/{1.73_m2} — ABNORMAL LOW (ref >90)
GLUCOSE: 158 mg/dL — AB (ref 70–140)
Glucose, Bld: 113 mg/dL — ABNORMAL HIGH (ref 65–99)
Potassium: 4.1 mEq/L (ref 3.5–5.1)
Potassium: 3.7 mmol/L (ref 3.5–5.1)
SODIUM: 133 meq/L — AB (ref 136–145)
Sodium: 136 mmol/L (ref 135–145)
TOTAL PROTEIN: 6.3 g/dL — AB (ref 6.4–8.3)
Total Bilirubin: 3.4 mg/dL — ABNORMAL HIGH (ref 0.3–1.2)
Total Protein: 6.5 g/dL (ref 6.5–8.1)

## 2016-10-14 LAB — CBC & DIFF AND RETIC
BASO%: 0.8 % (ref 0.0–2.0)
Basophils Absolute: 0.1 10e3/uL (ref 0.0–0.1)
EOS%: 3.8 % (ref 0.0–7.0)
Eosinophils Absolute: 0.2 10e3/uL (ref 0.0–0.5)
HCT: 22.3 % — ABNORMAL LOW (ref 38.4–49.9)
HGB: 6.9 g/dL — CL (ref 13.0–17.1)
Immature Retic Fract: 17 % — ABNORMAL HIGH (ref 3.00–10.60)
LYMPH%: 16.4 % (ref 14.0–49.0)
MCH: 25.9 pg — ABNORMAL LOW (ref 27.2–33.4)
MCHC: 30.9 g/dL — ABNORMAL LOW (ref 32.0–36.0)
MCV: 83.8 fL (ref 79.3–98.0)
MONO#: 1.1 10e3/uL — ABNORMAL HIGH (ref 0.1–0.9)
MONO%: 17.5 % — ABNORMAL HIGH (ref 0.0–14.0)
NEUT#: 3.7 10e3/uL (ref 1.5–6.5)
NEUT%: 61.5 % (ref 39.0–75.0)
Platelets: 106 10e3/uL — ABNORMAL LOW (ref 140–400)
RBC: 2.66 10e6/uL — ABNORMAL LOW (ref 4.20–5.82)
RDW: 17.9 % — ABNORMAL HIGH (ref 11.0–14.6)
Retic %: 3.03 % — ABNORMAL HIGH (ref 0.80–1.80)
Retic Ct Abs: 80.6 10e3/uL (ref 34.80–93.90)
WBC: 6.1 10e3/uL (ref 4.0–10.3)
lymph#: 1 10e3/uL (ref 0.9–3.3)

## 2016-10-14 LAB — PROTIME-INR
INR: 1.64
PROTHROMBIN TIME: 19.3 s — AB (ref 11.4–15.2)

## 2016-10-14 LAB — PREPARE RBC (CROSSMATCH)

## 2016-10-14 LAB — TECHNOLOGIST REVIEW

## 2016-10-14 LAB — POC OCCULT BLOOD, ED: Fecal Occult Bld: POSITIVE — AB

## 2016-10-14 LAB — APTT: APTT: 42 s — AB (ref 24–36)

## 2016-10-14 MED ORDER — METOPROLOL TARTRATE 25 MG PO TABS
12.5000 mg | ORAL_TABLET | Freq: Two times a day (BID) | ORAL | Status: DC
  Administered 2016-10-14 – 2016-10-16 (×4): 12.5 mg via ORAL
  Filled 2016-10-14 (×4): qty 1

## 2016-10-14 MED ORDER — SODIUM CHLORIDE 0.9 % IV SOLN
10.0000 mL/h | Freq: Once | INTRAVENOUS | Status: AC
  Administered 2016-10-14: 10 mL/h via INTRAVENOUS

## 2016-10-14 MED ORDER — TORSEMIDE 20 MG PO TABS
40.0000 mg | ORAL_TABLET | Freq: Every day | ORAL | Status: DC
  Administered 2016-10-15 – 2016-10-16 (×2): 40 mg via ORAL
  Filled 2016-10-14 (×2): qty 2

## 2016-10-14 MED ORDER — OMEGA-3-ACID ETHYL ESTERS 1 G PO CAPS
2.0000 | ORAL_CAPSULE | Freq: Every day | ORAL | Status: DC
  Administered 2016-10-14: 2 g via ORAL
  Filled 2016-10-14 (×3): qty 2

## 2016-10-14 MED ORDER — SPIRONOLACTONE 25 MG PO TABS
25.0000 mg | ORAL_TABLET | Freq: Every day | ORAL | Status: DC
  Administered 2016-10-15 – 2016-10-16 (×2): 25 mg via ORAL
  Filled 2016-10-14 (×2): qty 1

## 2016-10-14 MED ORDER — ONDANSETRON HCL 4 MG/2ML IJ SOLN: 4.0000 mg | Freq: Four times a day (QID) | INTRAMUSCULAR | Status: DC | PRN

## 2016-10-14 MED ORDER — FUROSEMIDE 10 MG/ML IJ SOLN
20.0000 mg | Freq: Once | INTRAMUSCULAR | Status: DC | PRN
  Filled 2016-10-14: qty 2

## 2016-10-14 MED ORDER — POTASSIUM CHLORIDE CRYS ER 20 MEQ PO TBCR
20.0000 meq | EXTENDED_RELEASE_TABLET | Freq: Two times a day (BID) | ORAL | Status: DC
Start: 2016-10-14 — End: 2016-10-16
  Administered 2016-10-14 – 2016-10-16 (×3): 20 meq via ORAL
  Filled 2016-10-14 (×4): qty 1

## 2016-10-14 MED ORDER — FLUTICASONE PROPIONATE 50 MCG/ACT NA SUSP
2.0000 | Freq: Every day | NASAL | Status: DC | PRN
  Administered 2016-10-15: 2 via NASAL
  Filled 2016-10-14: qty 16

## 2016-10-14 MED ORDER — PANTOPRAZOLE SODIUM 40 MG PO TBEC
40.0000 mg | DELAYED_RELEASE_TABLET | Freq: Two times a day (BID) | ORAL | Status: DC
  Administered 2016-10-14 – 2016-10-16 (×3): 40 mg via ORAL
  Filled 2016-10-14 (×3): qty 1

## 2016-10-14 MED ORDER — VITAMIN D3 25 MCG (1000 UNIT) PO TABS
1000.0000 [IU] | ORAL_TABLET | Freq: Every day | ORAL | Status: DC
  Administered 2016-10-16: 1000 [IU] via ORAL
  Filled 2016-10-14 (×2): qty 1

## 2016-10-14 MED ORDER — ONDANSETRON HCL 4 MG PO TABS: 4.0000 mg | ORAL_TABLET | Freq: Four times a day (QID) | ORAL | Status: DC | PRN

## 2016-10-14 MED ORDER — ACETAMINOPHEN 325 MG PO TABS
650.0000 mg | ORAL_TABLET | Freq: Four times a day (QID) | ORAL | Status: DC | PRN
  Administered 2016-10-14: 650 mg via ORAL
  Filled 2016-10-14: qty 2

## 2016-10-14 MED ORDER — ACETAMINOPHEN 650 MG RE SUPP: 650.0000 mg | Freq: Four times a day (QID) | RECTAL | Status: DC | PRN

## 2016-10-14 MED ORDER — DOCUSATE SODIUM 100 MG PO CAPS: 100.0000 mg | ORAL_CAPSULE | Freq: Two times a day (BID) | ORAL | Status: DC | PRN

## 2016-10-14 NOTE — ED Notes (Signed)
Call Report to [facility] [phone] @ 1720

## 2016-10-14 NOTE — ED Provider Notes (Signed)
[facility] DEPT Provider Note   CSN: [id number] Arrival date & time: 10/14/16  1235     History   Chief Complaint Chief Complaint  Patient presents with  . Abnormal Lab    HPI William [name] is a 66 y.o. male.  HPI 66 year old male who presents with abnormal lab. He has a history of NASH, chronic diastolic heart failure, diabetes. With recurrent anemia loss due to GI bleed of unclear etiology. Recently admitted at the beginning of August for blood transfusion. With upper endoscopy by Dr. [name] at that time showing no evidence of bleeding but presence of portal hypertensive gastropathy. In the interim has been referred to [facility] gastroenterology and had an enteroscopy was reportedly unremarkable aside from nonbleeding lesion that was removed. He continues to be transfusion dependent. Was seen by Dr. Irene Limbo earlier today and had recurrent low hemoglobin of 6.9 and sent to the ED for evaluation.He reports still feeling very weak and fatigued. Denies any recent melena or hematochezia. Denies chest pain or difficulty breathing. Did have a fever yesterday for 100.9 Fahrenheit after his procedure with mild cough. Denies any dysuria, urinary frequency, abdominal pain, vomiting or diarrhea.last transfused 10/06/2016 and Dr. Grier Mitts office.   Past Medical History:  Diagnosis Date  . AAA (abdominal aortic aneurysm) ([place]) 03/2014   3.2 cm on MR abd.  F/u aortic u/s 06/2014 showed 3.0 x 3.1 cm AAA: recheck 2 yrs recommended (followed by cardiologist)  . Anemia   . Bilateral renal cysts    simple (03/2014 MRI)  . CAD (coronary artery disease)   . Cholelithiases 2018   asymptomatic  . Chronic diastolic heart failure ([place])   . Cirrhosis ([place]) 05/2016   secondary to NASH; most recent u/s abd 07/2016 showed splenomegaly.  Hx of portal HTN changes with mild ascites and splenomegaly.  . Diabetes mellitus with complication ([place]) 89/2119   A1c 6.8%  . History of blood transfusion 2018 X 4 dates   "low  blood" (09/21/2016)  . Hyperlipemia, mixed    elevated LFTs when on statins.    . Hypertension    Cr bump 04/01/16 so I changed benicar-hct to benicar plain and added amlodipine 5 mg.  . Iron deficiency anemia 05/2016   Acute blood loss anemia: hospitalized, required transfusion x 3 U: colonoscopy and capsule study unrevealing.  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  W/u for ? hemolytic anemia to be pursued by hematologist as outpt.  Appt with Dr. Cristi Loron at [facility] 09/13/16.  . Microscopic hematuria    Eval unremarkable by Dr. Eulogio Ditch.  [name] NASH (nonalcoholic steatohepatitis)    Fatty liver on MR abd 03/2014, + hx of elevated transaminases and bili: followed by Dr. [name].  . Obesity   . Spleen enlarged     Patient Active Problem List   Diagnosis Date Noted  . Acute blood loss anemia 08/31/2016  . Iron deficiency anemia due to chronic blood loss 08/31/2016  . Acute GI bleeding 08/31/2016  . Cirrhosis, nonalcoholic ([place]) 41/74/0814  . Portal hypertension ([place]) 08/31/2016  . Splenomegaly 08/31/2016  . Esophageal varices ([place]) 08/31/2016  . Symptomatic anemia 07/30/2016  . Thrombocytopenia ([place]) 07/29/2016  . Weakness 07/28/2016  . Aortic stenosis, mild 07/28/2016  . Chronic diastolic heart failure ([place])   . History of GI bleed 05/22/2016  . Pulmonary edema 05/22/2016  . Acute on chronic renal failure ([place]) 05/22/2016  . Non-insulin treated type  2 diabetes mellitus ([place]) 05/22/2016  . Moderate obesity 11/15/2013  . Abdominal aortic aneurysm ([place]) 03/19/2013  . Hematuria 03/19/2013  . Morbid obesity ([place]) 08/24/2012  . 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 Metabolic syndrome 24/10/7351  . Essential hypertension 08/24/2012  . Mixed hyperlipidemia 08/24/2012    Past Surgical History:  Procedure Laterality Date  . CARDIAC CATHETERIZATION    . CARDIOVASCULAR STRESS TEST  01/17/2012; 05/04/16   Normal  stress nuclear study x 2 (2018--Normal perfusion. LVEF 66% with normal wall motion. This is a low risk study).  . CERVICAL DISCECTOMY  1992  . COLONOSCOPY N/A 05/27/2016   No site/explanation for blood loss found.  Erythematous mucosa in cecum and ascending colon--Cecal bx: normal.  Procedure: COLONOSCOPY;  Surgeon: Carol Ada, MD;  Location: [facility] ENDOSCOPY;  Service: Endoscopy;  Laterality: N/A;  . COLONOSCOPY W/ POLYPECTOMY  09/10/2014   Polypectomy x 2: recall 5 yrs (Dr. [name]).  . CORONARY ANGIOPLASTY    . CORONARY ARTERY BYPASS GRAFT  03/2006  . ENTEROSCOPY N/A 07/31/2016   Procedure: ENTEROSCOPY;  Surgeon: Ladene Artist, MD;  Location: [facility] ENDOSCOPY;  Service: Endoscopy;  Laterality: N/A;  . ESOPHAGOGASTRODUODENOSCOPY N/A 05/25/2016   No site/explanation for blood loss found.  Procedure: ESOPHAGOGASTRODUODENOSCOPY (EGD);  Surgeon: Juanita Craver, MD;  Location: [facility] ENDOSCOPY;  Service: Endoscopy;  Laterality: N/A;  . ESOPHAGOGASTRODUODENOSCOPY N/A 09/23/2016   Procedure: ESOPHAGOGASTRODUODENOSCOPY (EGD);  Surgeon: Carol Ada, MD;  [facility];  Service: Endoscopy;  Laterality: N/A;  . GIVENS CAPSULE STUDY N/A 05/27/2016   Procedure: GIVENS CAPSULE STUDY;  Surgeon: Carol Ada, MD;  Location: [facility];  Service: Endoscopy;  Laterality: N/A;  . NECK SURGERY  1991  . TRANSTHORACIC ECHOCARDIOGRAM  05/25/2016   EF 60%, normal wall motion, grd II DD, mild aortic stenosis, dilated aortic root and ascending aorta.  [name] VASECTOMY  1977       Home Medications    Prior to Admission medications   Medication Sig Start Date End Date Taking? Authorizing Provider  acetaminophen (TYLENOL) 325 MG tablet Take 650 mg by mouth every 6 (six) hours as needed for fever.   Yes [provider]  cholecalciferol (VITAMIN D) 1000 units tablet Take 1,000 Units by mouth daily.   Yes [provider]  docusate sodium (COLACE) 100 MG capsule Take 100 mg by mouth 2 (two) times daily as  needed for mild constipation (1 - 2 tabs as needed).   Yes [provider]  fluticasone (FLONASE) 50 MCG/ACT nasal spray Place 2 sprays into both nostrils daily. Patient taking differently: Place 2 sprays into both nostrils daily as needed for allergies.  01/01/16  Yes Withrow, Elyse Jarvis, FNP  metoprolol tartrate (LOPRESSOR) 25 MG tablet Take 0.5 tablets (12.5 mg total) by mouth 2 (two) times daily. 06/22/16  Yes McGowen, Adrian Blackwater, MD  omega-3 acid ethyl esters (LOVAZA) 1 g capsule TAKE 2 CAPSULES BY MOUTH ONCE DAILY Patient taking differently: TAKE 2 CAPSULES BY MOUTH DAILY 03/24/16  Yes Troy Sine, MD  pantoprazole (PROTONIX) 40 MG tablet Take 1 tablet (40 mg total) by mouth 2 (two) times daily before a meal. 10/11/16  Yes McGowen, Adrian Blackwater, MD  potassium chloride SA (K-DUR,KLOR-CON) 20 MEQ tablet Take 1 tablet (20 mEq total) by mouth 2 (two) times daily. 06/22/16  Yes McGowen, Adrian Blackwater, MD  spironolactone (ALDACTONE) 25 MG tablet Take 1 tablet (25 mg total) by mouth daily. 06/22/16  Yes McGowen, Adrian Blackwater, MD  torsemide (DEMADEX) 20 MG tablet  Take 2 tablets (40 mg total) by mouth daily. 06/22/16  Yes McGowen, Adrian Blackwater, MD  Evolocumab (REPATHA SURECLICK) 423 MG/ML SOAJ Inject 140 mg into the skin every 14 (fourteen) days. 10/01/16   Troy Sine, MD    Family History Family History  Problem Relation Age of Onset  . Hypertension Mother   . Cancer - Other Mother        liver cancer  . Heart disease Father   . Heart attack Father   . Cancer - Lung Father   . Diabetes Father   . Liver disease Sister   . Anemia Sister   . Heart disease Sister   . Heart attack Sister   . Heart disease Brother        CABG 20 YEARS AGO  . Hypertension Brother   . Mesothelioma Brother        HALF-BROTHER  . Cancer - Other Brother        CLL  . Cancer - Lung Brother        Mets to brain  . Cancer - Lung Sister        HALF-SISTER  . Liver disease Brother   . Heart disease Brother        CABG-2012    . Emphysema Maternal Grandfather   . Cancer - Other Paternal Grandmother        Stomach  . Heart attack Paternal Grandfather     Social History Social History  Substance Use Topics  . Smoking status: Former Smoker    Types: Cigarettes  . Smokeless tobacco: Never Used     Comment: QUIT I 1983  . Alcohol use No     Allergies   Statins   Review of Systems Review of Systems  Constitutional: Positive for fatigue and fever.  Respiratory: Negative for shortness of breath.   Cardiovascular: Negative for chest pain.  Gastrointestinal: Negative for abdominal pain, diarrhea, nausea and vomiting.  All other systems reviewed and are negative.    Physical Exam Updated Vital Signs BP 138/71 (BP Location: Left Arm)   Pulse 78   Temp 98.3 F (36.8 C) (Oral)   Resp (!) 21   SpO2 100%   Physical Exam Physical Exam  Nursing note and vitals reviewed. Constitutional: Non-toxic, and in no acute distress Head: Normocephalic and atraumatic.  Mouth/Throat: Oropharynx is clear and moist.  Neck: Normal range of motion. Neck supple.  Cardiovascular: Normal rate and regular rhythm.   Pulmonary/Chest: Effort normal and breath sounds normal.  Abdominal: Soft. There is no tenderness. There is no rebound and no guarding.  Musculoskeletal: Normal range of motion.  Neurological: Alert, no facial droop, fluent speech, moves all extremities symmetrically Skin: Skin is warm and dry.  Psychiatric: Cooperative   ED Treatments / Results  Labs (all labs ordered are listed, but only abnormal results are displayed) Labs Reviewed  CBC WITH DIFFERENTIAL/PLATELET - Abnormal; Notable for the following:       Result Value   RBC 2.70 (*)    Hemoglobin 7.1 (*)    HCT 22.4 (*)    RDW 18.0 (*)    Platelets 116 (*)    All other components within normal limits  COMPREHENSIVE METABOLIC PANEL - Abnormal; Notable for the following:    Glucose, Bld 113 (*)    Calcium 8.4 (*)    Albumin 2.8 (*)    Total  Bilirubin 3.4 (*)    All other components within normal limits  PROTIME-INR - Abnormal; Notable  for the following:    Prothrombin Time 19.3 (*)    All other components within normal limits  APTT - Abnormal; Notable for the following:    aPTT 42 (*)    All other components within normal limits  URINALYSIS, ROUTINE W REFLEX MICROSCOPIC  POC OCCULT BLOOD, ED  TYPE AND SCREEN  PREPARE RBC (CROSSMATCH)    EKG  EKG Interpretation None       Radiology Dg Chest 2 View  Result Date: 10/14/2016 CLINICAL DATA:  Productive cough for 2-3 days.  Anemia. EXAM: CHEST  2 VIEW COMPARISON:  07/29/2016 FINDINGS: Prior CABG. Faint interstitial accentuation bilaterally with poor definition of lung markings. Thoracic spondylosis.  No pleural effusion. IMPRESSION: 1. Chronic mildly accentuated pulmonary interstitium without overt edema or airspace opacity. 2. Heart size normal.  Prior CABG. 3. Thoracic spondylosis. Electronically Signed   By: Van Clines M.D.   On: 10/14/2016 13:33    Procedures Procedures (including critical care time) CRITICAL CARE Performed by: Forde Dandy   Total critical care time: 31 minutes  Critical care time was exclusive of separately billable procedures and treating other patients.  Critical care was necessary to treat or prevent imminent or life-threatening deterioration.  Critical care was time spent personally by me on the following activities: development of treatment plan with patient and/or surrogate as well as nursing, discussions with consultants, evaluation of patient's response to treatment, examination of patient, obtaining history from patient or surrogate, ordering and performing treatments and interventions, ordering and review of laboratory studies, ordering and review of radiographic studies, pulse oximetry and re-evaluation of patient's condition.  Medications Ordered in ED Medications  0.9 %  sodium chloride infusion (not administered)      Initial Impression / Assessment and Plan / ED Course  I have reviewed the triage vital signs and the nursing notes.  Pertinent labs & imaging results that were available during my care of the patient were reviewed by me and considered in my medical decision making (see chart for details).     Records reviewed. Upper endoscopy performed by Dr. [name] in August 2018 showing portal hypertensive gastropathy.with recent enteroscopy, but no documented results. Per patient there is a small lesion that was resected, but there is no active bleeding or source of bleeding.  History of recurrent anemia due to chronic GI bleed of unclear etiology. Hemodynamically stable. Recheck hemoglobin is 7.1. Stool is brown without melena or hematochezia, but occult blood positive. We'll transfuse 2 units of blood for symptomatic anemia.   I discussed with Dr. [name], who agreed with admission. Agrees with tagged RBC scan. Will consult in the hospital.  Spoke with hospitalist service, who will admit for ongoing management.  Final Clinical Impressions(s) / ED Diagnoses   Final diagnoses:  Symptomatic anemia  Chronic GI bleeding    New Prescriptions New Prescriptions   No medications on file     Forde Dandy, MD 10/14/16 [phone]

## 2016-10-14 NOTE — Progress Notes (Signed)
Per Dr. Irene Limbo, ED contacted for pt admission due to Hgb 6.9, weakness,  and need for GI workup.  Per Apolonio Schneiders, charge RN in ED, ok to bring pt to room 16.  Pt transported via wheelchair with wife to ED.  Report given to [place], Therapist, sports.  Pt A&O/no apparent distress at time of arrival to ED.

## 2016-10-14 NOTE — Consult Note (Signed)
Reason for Consult: Symptomatic anemia Referring Physician: [facility]  Shona Needles HPI: This is a 66 year old male who is well-known to me for his history of IDA and obscure GI bleeding, NASH cirrhosis, AAA, cholelthiasis, CAD, and hyperlipidemia admitted for recurrent anemia.  The patient was seen today with Hematology and he reported feeling weaker.  A CBC was obtained and it revealed an HGB of 6.9 g/dL.  Since this past April the patient has undergone multiple endoscopic procedures without any overt findings of bleeding.  There is evidence of portal HTN gastropathy and small varices without red wale signs.  A capsule endoscopy was also negative for any overt bleeding source.  He was recently evaluated at [facility] with a spiral enterography without any obvious bleeding source.  A couple of jejunal polyps were removed and clipped.  His hemoccult was positive today.  Past Medical History:  Diagnosis Date  . AAA (abdominal aortic aneurysm) ([place]) 03/2014   3.2 cm on MR abd.  F/u aortic u/s 06/2014 showed 3.0 x 3.1 cm AAA: recheck 2 yrs recommended (followed by cardiologist)  . Anemia   . Bilateral renal cysts    simple (03/2014 MRI)  . CAD (coronary artery disease)   . Cholelithiases 2018   asymptomatic  . Chronic diastolic heart failure ([place])   . Cirrhosis ([place]) 05/2016   secondary to NASH; most recent u/s abd 07/2016 showed splenomegaly.  Hx of portal HTN changes with mild ascites and splenomegaly.  . Diabetes mellitus with complication ([place]) 95/6387   A1c 6.8%  . History of blood transfusion 2018 X 4 dates   "low blood" (09/21/2016)  . Hyperlipemia, mixed    elevated LFTs when on statins.    . Hypertension    Cr bump 04/01/16 so I changed benicar-hct to benicar plain and added amlodipine 5 mg.  . Iron deficiency anemia 05/2016   Acute blood loss anemia: hospitalized, required transfusion x 3 U: colonoscopy and capsule study unrevealing.  Readmitted 6/22-6/25, 2018 for symptomatic  anemia again, got transfused x 4U, EGD with grd I esoph varices and port hyt gastropathy.  W/u for ? hemolytic anemia to be pursued by hematologist as outpt.  Appt with Dr. Cristi Loron at [facility] 09/13/16.  . Microscopic hematuria    Eval unremarkable by Dr. Eulogio Ditch.  [name] NASH (nonalcoholic steatohepatitis)    Fatty liver on MR abd 03/2014, + hx of elevated transaminases and bili: followed by Dr. [name].  . Obesity   . Spleen enlarged     Past Surgical History:  Procedure Laterality Date  . CARDIAC CATHETERIZATION    . CARDIOVASCULAR STRESS TEST  01/17/2012; 05/04/16   Normal stress nuclear study x 2 (2018--Normal perfusion. LVEF 66% with normal wall motion. This is a low risk study).  . CERVICAL DISCECTOMY  1992  . COLONOSCOPY N/A 05/27/2016   No site/explanation for blood loss found.  Erythematous mucosa in cecum and ascending colon--Cecal bx: normal.  Procedure: COLONOSCOPY;  Surgeon: Carol Ada, MD;  Location: [facility] ENDOSCOPY;  Service: Endoscopy;  Laterality: N/A;  . COLONOSCOPY W/ POLYPECTOMY  09/10/2014   Polypectomy x 2: recall 5 yrs (Dr. [name]).  . CORONARY ANGIOPLASTY    . CORONARY ARTERY BYPASS GRAFT  03/2006  . ENTEROSCOPY N/A 07/31/2016   Procedure: ENTEROSCOPY;  Surgeon: Ladene Artist, MD;  Location: [facility] ENDOSCOPY;  Service: Endoscopy;  Laterality: N/A;  . ESOPHAGOGASTRODUODENOSCOPY N/A 05/25/2016   No site/explanation for blood loss found.  Procedure: ESOPHAGOGASTRODUODENOSCOPY (EGD);  Surgeon: Juanita Craver,  MD;  Location: [facility] ENDOSCOPY;  Service: Endoscopy;  Laterality: N/A;  . ESOPHAGOGASTRODUODENOSCOPY N/A 09/23/2016   Procedure: ESOPHAGOGASTRODUODENOSCOPY (EGD);  Surgeon: Carol Ada, MD;  Location: [place];  Service: Endoscopy;  Laterality: N/A;  . GIVENS CAPSULE STUDY N/A 05/27/2016   Procedure: GIVENS CAPSULE STUDY;  Surgeon: Carol Ada, MD;  Location: [place];  Service: Endoscopy;  Laterality: N/A;  . NECK SURGERY  1991  . TRANSTHORACIC ECHOCARDIOGRAM  05/25/2016    EF 60%, normal wall motion, grd II DD, mild aortic stenosis, dilated aortic root and ascending aorta.  [name] VASECTOMY  1977    Family History  Problem Relation Age of Onset  . Hypertension Mother   . Cancer - Other Mother        liver cancer  . Heart disease Father   . Heart attack Father   . Cancer - Lung Father   . Diabetes Father   . Liver disease Sister   . Anemia Sister   . Heart disease Sister   . Heart attack Sister   . Heart disease Brother        CABG 20 YEARS AGO  . Hypertension Brother   . Mesothelioma Brother        HALF-BROTHER  . Cancer - Other Brother        CLL  . Cancer - Lung Brother        Mets to brain  . Cancer - Lung Sister        HALF-SISTER  . Liver disease Brother   . Heart disease Brother        CABG-2012  . Emphysema Maternal Grandfather   . Cancer - Other Paternal Grandmother        Stomach  . Heart attack Paternal Grandfather     Social History:  reports that he has quit smoking. His smoking use included Cigarettes. He has never used smokeless tobacco. He reports that he does not drink alcohol or use drugs.  Allergies:  Allergies  Allergen Reactions  . Statins Other (See Comments)    Liver enzymes go up whenever on them    Medications:  Scheduled:  Continuous: . sodium chloride      Results for orders placed or performed during the hospital encounter of 10/14/16 (from the past 24 hour(s))  CBC with Differential     Status: Abnormal   Collection Time: 10/14/16  1:40 PM  Result Value Ref Range   WBC 6.5 4.0 - 10.5 K/uL   RBC 2.70 (L) 4.22 - 5.81 MIL/uL   Hemoglobin 7.1 (L) 13.0 - 17.0 g/dL   HCT 22.4 (L) 39.0 - 52.0 %   MCV 83.0 78.0 - 100.0 fL   MCH 26.3 26.0 - 34.0 pg   MCHC 31.7 30.0 - 36.0 g/dL   RDW 18.0 (H) 11.5 - 15.5 %   Platelets 116 (L) 150 - 400 K/uL   Neutrophils Relative % 59 %   Lymphocytes Relative 12 %   Monocytes Relative 24 %   Eosinophils Relative 4 %   Basophils Relative 1 %   Neutro Abs 3.7 1.7 - 7.7  K/uL   Lymphs Abs 0.8 0.7 - 4.0 K/uL   Monocytes Absolute 1.6 (H) 0.1 - 1.0 K/uL   Eosinophils Absolute 0.3 0.0 - 0.7 K/uL   Basophils Absolute 0.1 0.0 - 0.1 K/uL   RBC Morphology RARE NRBCs   Comprehensive metabolic panel     Status: Abnormal   Collection Time: 10/14/16  1:40 PM  Result Value Ref  Range   Sodium 136 135 - 145 mmol/L   Potassium 3.7 3.5 - 5.1 mmol/L   Chloride 105 101 - 111 mmol/L   CO2 24 22 - 32 mmol/L   Glucose, Bld 113 (H) 65 - 99 mg/dL   BUN 13 6 - 20 mg/dL   Creatinine, Ser 1.18 0.61 - 1.24 mg/dL   Calcium 8.4 (L) 8.9 - 10.3 mg/dL   Total Protein 6.5 6.5 - 8.1 g/dL   Albumin 2.8 (L) 3.5 - 5.0 g/dL   AST 33 15 - 41 U/L   ALT 22 17 - 63 U/L   Alkaline Phosphatase 80 38 - 126 U/L   Total Bilirubin 3.4 (H) 0.3 - 1.2 mg/dL   GFR calc non Af Amer >60 >60 mL/min   GFR calc Af Amer >60 >60 mL/min   Anion gap 7 5 - 15  Protime-INR     Status: Abnormal   Collection Time: 10/14/16  1:40 PM  Result Value Ref Range   Prothrombin Time 19.3 (H) 11.4 - 15.2 seconds   INR 1.64   APTT     Status: Abnormal   Collection Time: 10/14/16  1:40 PM  Result Value Ref Range   aPTT 42 (H) 24 - 36 seconds  Type and screen [place]     Status: None   Collection Time: 10/14/16  1:40 PM  Result Value Ref Range   ABO/RH(D) O POS    Antibody Screen NEG    Sample Expiration 10/17/2016   Urinalysis, Routine w reflex microscopic     Status: None   Collection Time: 10/14/16  2:18 PM  Result Value Ref Range   Color, Urine YELLOW YELLOW   APPearance CLEAR CLEAR   Specific Gravity, Urine 1.019 1.005 - 1.030   pH 5.0 5.0 - 8.0   Glucose, UA NEGATIVE NEGATIVE mg/dL   Hgb urine dipstick NEGATIVE NEGATIVE   Bilirubin Urine NEGATIVE NEGATIVE   Ketones, ur NEGATIVE NEGATIVE mg/dL   Protein, ur NEGATIVE NEGATIVE mg/dL   Nitrite NEGATIVE NEGATIVE   Leukocytes, UA NEGATIVE NEGATIVE  POC occult blood, ED     Status: Abnormal   Collection Time: 10/14/16  3:42 PM   Result Value Ref Range   Fecal Occult Bld POSITIVE (A) NEGATIVE     Dg Chest 2 View  Result Date: 10/14/2016 CLINICAL DATA:  Productive cough for 2-3 days.  Anemia. EXAM: CHEST  2 VIEW COMPARISON:  07/29/2016 FINDINGS: Prior CABG. Faint interstitial accentuation bilaterally with poor definition of lung markings. Thoracic spondylosis.  No pleural effusion. IMPRESSION: 1. Chronic mildly accentuated pulmonary interstitium without overt edema or airspace opacity. 2. Heart size normal.  Prior CABG. 3. Thoracic spondylosis. Electronically Signed   By: Van Clines M.D.   On: 10/14/2016 13:33    ROS:  As stated above in the HPI otherwise negative.  Blood pressure 138/71, pulse 78, temperature 98.3 F (36.8 C), temperature source Oral, resp. rate (!) 21, SpO2 100 %.    PE: Gen: NAD, Alert and Oriented HEENT:  [place]/AT, EOMI Neck: Supple, no LAD Lungs: CTA Bilaterally CV: RRR without M/G/R ABM: Soft, NTND, +BS Ext: No C/C/E  Assessment/Plan: 1) Obscure GI bleeding. 2) Cirrhosis. 3) Portal HTN gastropathy. 4) Small esophageal varices.   I think it is reasonable to pursue a bleeding scan.  My suspicion is that his bleeding is from the upper GI tract, but repeated examinations were normal.  If the bleeding scan is negative a repeat capsule endoscopy should be  pursued.  Plan: 1) Follow HGB and transfuse as necessary. 2) Bleeding scan. 3) If the bleeding scan is negative, I will order a capsule endoscopy.  Jashley Yellin D 10/14/2016, 4:07 PM

## 2016-10-14 NOTE — ED Triage Notes (Signed)
Pt is coming from the cancer center after having labs drawn this morning and hemoglobin resulting as 6.9. Pt denies dizziness. Pt does complain of generalized weakness. Pt had an endoscopy done on 9/5 at [facility]. Dr Irene Limbo is recommending a RBC tag scan. Pt AO x4 and NAD noted.

## 2016-10-14 NOTE — H&P (Signed)
History and Physical    William [name]:[id number] DOB: [birth year] DOA: 10/14/2016  PCP: Tammi Sou, MD  Patient coming from: Hematology clinic by Dr. Irene Limbo  Chief Complaint: Generalized weakness and low hemoglobin.  HPI: William [name] is a 66 y.o. male with medical history significant of NASH, cirrhosis, chronic diastolic congestive heart failure, cholelithiasis, hypertension, coronary artery disease status post CABG, AAA, chronic thrombocytopenia, iron deficiency anemia due to GI bleed requiring multiple blood transfusions and an extensive workup recently sent from hematology clinic for the evaluation of hemoglobin of 6.9. The patient has gone multiple endoscopic procedure recently by GI Dr. [name] and sewed portal hypertensive gastropathy. The patient recently had spiral enterography at [facility] without any obvious source of bleeding. They found few nonbleeding jejunal polyps which were clipped and removed.  Patient was feeling generally weak tired for last few days. He came to hematology clinic today when lab consistent with hemoglobin of 6.9. GI was contacted. Patient was referred to ER for further evaluation.  Patient denied fever, chills, headache, dizziness, chest pain, shortness of breath, nausea vomiting and abdominal pain. His wife at bedside.  ED Course: In the ER, hemoglobin was 7.1. Plan to transfuse 2 units of red blood cell. GI was consulted who recommended bleeding scan.  Review of Systems: As per HPI otherwise 10 point review of systems negative.    Past Medical History:  Diagnosis Date  . AAA (abdominal aortic aneurysm) ([place]) 03/2014   3.2 cm on MR abd.  F/u aortic u/s 06/2014 showed 3.0 x 3.1 cm AAA: recheck 2 yrs recommended (followed by cardiologist)  . Anemia   . Bilateral renal cysts    simple (03/2014 MRI)  . CAD (coronary artery disease)   . Cholelithiases 2018   asymptomatic  . Chronic diastolic heart failure ([place])   . Cirrhosis ([place]) 05/2016   secondary to NASH; most recent u/s abd 07/2016 showed splenomegaly.  Hx of portal HTN changes with mild ascites and splenomegaly.  . Diabetes mellitus with complication ([place]) 20/1007   A1c 6.8%  . History of blood transfusion 2018 X 4 dates   "low blood" (09/21/2016)  . Hyperlipemia, mixed    elevated LFTs when on statins.    . Hypertension    Cr bump 04/01/16 so I changed benicar-hct to benicar plain and added amlodipine 5 mg.  . Iron deficiency anemia 05/2016   Acute blood loss anemia: hospitalized, required transfusion x 3 U: colonoscopy and capsule study unrevealing.  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  W/u for ? hemolytic anemia to be pursued by hematologist as outpt.  Appt with Dr. Cristi Loron at [facility] 09/13/16.  . Microscopic hematuria    Eval unremarkable by Dr. Eulogio Ditch.  [name] NASH (nonalcoholic steatohepatitis)    Fatty liver on MR abd 03/2014, + hx of elevated transaminases and bili: followed by Dr. [name].  . Obesity   . Spleen enlarged     Past Surgical History:  Procedure Laterality Date  . CARDIAC CATHETERIZATION    . CARDIOVASCULAR STRESS TEST  01/17/2012; 05/04/16   Normal stress nuclear study x 2 (2018--Normal perfusion. LVEF 66% with normal wall motion. This is a low risk study).  . CERVICAL DISCECTOMY  1992  . COLONOSCOPY N/A 05/27/2016   No site/explanation for blood loss found.  Erythematous mucosa in cecum and ascending colon--Cecal bx: normal.  Procedure: COLONOSCOPY;  Surgeon: Carol Ada, MD;  Location: [facility] ENDOSCOPY;  Service: Endoscopy;  Laterality: N/A;  . COLONOSCOPY W/ POLYPECTOMY  09/10/2014   Polypectomy x 2: recall 5 yrs (Dr. [name]).  . CORONARY ANGIOPLASTY    . CORONARY ARTERY BYPASS GRAFT  03/2006  . ENTEROSCOPY N/A 07/31/2016   Procedure: ENTEROSCOPY;  Surgeon: Ladene Artist, MD;  Location: [facility] ENDOSCOPY;  Service: Endoscopy;  Laterality: N/A;  .  ESOPHAGOGASTRODUODENOSCOPY N/A 05/25/2016   No site/explanation for blood loss found.  Procedure: ESOPHAGOGASTRODUODENOSCOPY (EGD);  Surgeon: Juanita Craver, MD;  Location: [facility] ENDOSCOPY;  Service: Endoscopy;  Laterality: N/A;  . ESOPHAGOGASTRODUODENOSCOPY N/A 09/23/2016   Procedure: ESOPHAGOGASTRODUODENOSCOPY (EGD);  Surgeon: Carol Ada, MD;  Location: [place];  Service: Endoscopy;  Laterality: N/A;  . GIVENS CAPSULE STUDY N/A 05/27/2016   Procedure: GIVENS CAPSULE STUDY;  Surgeon: Carol Ada, MD;  Location: [place];  Service: Endoscopy;  Laterality: N/A;  . NECK SURGERY  1991  . TRANSTHORACIC ECHOCARDIOGRAM  05/25/2016   EF 60%, normal wall motion, grd II DD, mild aortic stenosis, dilated aortic root and ascending aorta.  [name] VASECTOMY  1977    Social history: reports that he has quit smoking. His smoking use included Cigarettes. He has never used smokeless tobacco. He reports that he does not drink alcohol or use drugs.  Allergies  Allergen Reactions  . Statins Other (See Comments)    Liver enzymes go up whenever on them    Family History  Problem Relation Age of Onset  . Hypertension Mother   . Cancer - Other Mother        liver cancer  . Heart disease Father   . Heart attack Father   . Cancer - Lung Father   . Diabetes Father   . Liver disease Sister   . Anemia Sister   . Heart disease Sister   . Heart attack Sister   . Heart disease Brother        CABG 20 YEARS AGO  . Hypertension Brother   . Mesothelioma Brother        HALF-BROTHER  . Cancer - Other Brother        CLL  . Cancer - Lung Brother        Mets to brain  . Cancer - Lung Sister        HALF-SISTER  . Liver disease Brother   . Heart disease Brother        CABG-2012  . Emphysema Maternal Grandfather   . Cancer - Other Paternal Grandmother        Stomach  . Heart attack Paternal Grandfather      Prior to Admission medications   Medication Sig Start Date End Date Taking? Authorizing Provider    acetaminophen (TYLENOL) 325 MG tablet Take 650 mg by mouth every 6 (six) hours as needed for fever.   Yes [provider]  cholecalciferol (VITAMIN D) 1000 units tablet Take 1,000 Units by mouth daily.   Yes [provider]  docusate sodium (COLACE) 100 MG capsule Take 100 mg by mouth 2 (two) times daily as needed for mild constipation (1 - 2 tabs as needed).   Yes [provider]  fluticasone (FLONASE) 50 MCG/ACT nasal spray Place 2 sprays into both nostrils daily. Patient taking differently: Place 2 sprays into both nostrils daily as needed for allergies.  01/01/16  Yes Withrow, Elyse Jarvis, FNP  metoprolol tartrate (LOPRESSOR) 25 MG tablet Take 0.5 tablets (12.5 mg total) by mouth 2 (two) times daily. 06/22/16  Yes McGowen,  Adrian Blackwater, MD  omega-3 acid ethyl esters (LOVAZA) 1 g capsule TAKE 2 CAPSULES BY MOUTH ONCE DAILY Patient taking differently: TAKE 2 CAPSULES BY MOUTH DAILY 03/24/16  Yes Troy Sine, MD  pantoprazole (PROTONIX) 40 MG tablet Take 1 tablet (40 mg total) by mouth 2 (two) times daily before a meal. 10/11/16  Yes McGowen, Adrian Blackwater, MD  potassium chloride SA (K-DUR,KLOR-CON) 20 MEQ tablet Take 1 tablet (20 mEq total) by mouth 2 (two) times daily. 06/22/16  Yes McGowen, Adrian Blackwater, MD  spironolactone (ALDACTONE) 25 MG tablet Take 1 tablet (25 mg total) by mouth daily. 06/22/16  Yes McGowen, Adrian Blackwater, MD  torsemide (DEMADEX) 20 MG tablet Take 2 tablets (40 mg total) by mouth daily. 06/22/16  Yes McGowen, Adrian Blackwater, MD  Evolocumab (REPATHA SURECLICK) 850 MG/ML SOAJ Inject 140 mg into the skin every 14 (fourteen) days. 10/01/16   Troy Sine, MD    Physical Exam: Vitals:   10/14/16 1248 10/14/16 1434  BP: 111/61 138/71  Pulse: 76 78  Resp: 18 (!) 21  Temp: 98.8 F (37.1 C) 98.3 F (36.8 C)  TempSrc: Oral Oral  SpO2: 98% 100%      Constitutional: NAD, calm, comfortable Vitals:   10/14/16 1248 10/14/16 1434  BP: 111/61 138/71  Pulse: 76 78  Resp: 18  (!) 21  Temp: 98.8 F (37.1 C) 98.3 F (36.8 C)  TempSrc: Oral Oral  SpO2: 98% 100%   Eyes: PERRL, lids and conjunctivae Pale ENMT: Mucous membranes are moist. Pale looking Neck: normal, supple Respiratory: clear to auscultation bilaterally, no wheezing, no crackles. Normal respiratory effort. No accessory muscle use.  Cardiovascular: Regular rate and rhythm, S1S2 Normal. no murmurs / rubs / gallops. No extremity edema.   Abdomen: Soft, Non-tender, non-distended. Bowel sounds positive.  Musculoskeletal: no clubbing / cyanosis. No joint deformity upper and lower extremities. Skin: no rashes, lesions, ulcers. No induration Neurologic: CN 2-12 grossly intact. Sensation intact.  Strength 5/5 in all 4.  Psychiatric: Normal judgment and insight. Alert and oriented x 3. Normal mood.    Labs on Admission: I have personally reviewed following labs and imaging studies  CBC:  Recent Labs Lab 10/14/16 1127 10/14/16 1340  WBC 6.1 6.5  NEUTROABS 3.7 3.7  HGB 6.9* 7.1*  HCT 22.3* 22.4*  MCV 83.8 83.0  PLT 106* 277*   Basic Metabolic Panel:  Recent Labs Lab 10/14/16 1127 10/14/16 1340  NA 133* 136  K 4.1 3.7  CL  --  105  CO2 24 24  GLUCOSE 158* 113*  BUN 12.9 13  CREATININE 1.3 1.18  CALCIUM 8.4 8.4*   GFR: Estimated Creatinine Clearance: 73.9 mL/min (by C-G formula based on SCr of 1.18 mg/dL). Liver Function Tests:  Recent Labs Lab 10/14/16 1127 10/14/16 1340  AST 27 33  ALT 19 22  ALKPHOS 93 80  BILITOT 3.47* 3.4*  PROT 6.3* 6.5  ALBUMIN 2.5* 2.8*   No results for input(s): LIPASE, AMYLASE in the last 168 hours. No results for input(s): AMMONIA in the last 168 hours. Coagulation Profile:  Recent Labs Lab 10/14/16 1340  INR 1.64   Cardiac Enzymes: No results for input(s): CKTOTAL, CKMB, CKMBINDEX, TROPONINI in the last 168 hours. BNP (last 3 results)  Recent Labs  07/28/16 1416  PROBNP 120   HbA1C: No results for input(s): HGBA1C in the last 72  hours. CBG: No results for input(s): GLUCAP in the last 168 hours. Lipid Profile: No results for input(s): CHOL, HDL,  LDLCALC, TRIG, CHOLHDL, LDLDIRECT in the last 72 hours. Thyroid Function Tests: No results for input(s): TSH, T4TOTAL, FREET4, T3FREE, THYROIDAB in the last 72 hours. Anemia Panel:  Recent Labs  10/14/16 1127  RETICCTPCT 3.03*   Urine analysis:    Component Value Date/Time   COLORURINE YELLOW 10/14/2016 [place] 10/14/2016 1418   LABSPEC 1.019 10/14/2016 1418   PHURINE 5.0 10/14/2016 1418   GLUCOSEU NEGATIVE 10/14/2016 1418   HGBUR NEGATIVE 10/14/2016 1418   BILIRUBINUR NEGATIVE 10/14/2016 1418   KETONESUR NEGATIVE 10/14/2016 1418   PROTEINUR NEGATIVE 10/14/2016 1418   NITRITE NEGATIVE 10/14/2016 1418   LEUKOCYTESUR NEGATIVE 10/14/2016 1418   Sepsis Labs: !!!!!!!!!!!!!!!!!!!!!!!!!!!!!!!!!!!!!!!!!!!! @LABRCNTIP (procalcitonin:4,lacticidven:4) ) Recent Results (from the past 240 hour(s))  TECHNOLOGIST REVIEW     Status: None   Collection Time: 10/06/16 11:57 AM  Result Value Ref Range Status   Technologist Review occ rbc fragments and large plts seen  Final  TECHNOLOGIST REVIEW     Status: None   Collection Time: 10/14/16 11:27 AM  Result Value Ref Range Status   Technologist Review Large & giant platelets, rare meta, few fragments  Final     Radiological Exams on Admission: Dg Chest 2 View  Result Date: 10/14/2016 CLINICAL DATA:  Productive cough for 2-3 days.  Anemia. EXAM: CHEST  2 VIEW COMPARISON:  07/29/2016 FINDINGS: Prior CABG. Faint interstitial accentuation bilaterally with poor definition of lung markings. Thoracic spondylosis.  No pleural effusion. IMPRESSION: 1. Chronic mildly accentuated pulmonary interstitium without overt edema or airspace opacity. 2. Heart size normal.  Prior CABG. 3. Thoracic spondylosis. Electronically Signed   By: Van Clines M.D.   On: 10/14/2016 13:33   EKG reviewed. Sinus rhythm with incomplete  right bundle branch block. Unchanged from before.   Assessment/Plan Active Problems:   Iron deficiency anemia due to chronic blood loss   Anemia  # Acute on chronic anemia likely due to chronic blood loss: -Patient with multiple endoscopies evaluation with no obvious source of bleeding. Endoscopy showed portal hypertensive gastropathy and recently had into endoscopy done at [facility] with no source of bleeding. Only found to have nonbleeding jejunal polyps which were removed and cleaned. Patient with symptomatic anemia including generalized weakness and fatigue. His hemoglobin was 6.9 in hematology clinic. Fecal occult blood test positive. He reports normal color stool for last 3 days. No diarrhea or constipation. -Patient was already evaluated by GI recommended a bleeding scan. I ordered a bleeding scan. -Receiving 2 units of red blood cell transfusion in ER. I'll order Lasix IV in between blood transfusion because of history of congestive heart failure. -Further evaluation and plan as per GI. Continue to monitor CBC.  #Chronic diastolic congestive heart failure: Does not look like fluid overload. Continue home dose of diuretics. Ordered a dose of IV Lasix in between transfusion. -Continue cardiac medication including metoprolol, Aldactone  #Hypertension: Continue to monitor blood pressure.  # History of NASH, liver cirrhosis and chronic thrombocytopenia: Continue to monitor. GI follow-up. On Protonix.  #Patient reported subjective fever yesterday to 100.9 with dry cough. Chest x-ray unremarkable for any infiltrates or pneumonia. On exam benign. I'll continue to monitor. Tylenol as needed.  I discussed above with the patient, his wife at bedside, ER physician.  DVT prophylaxis: SCD Code Status: Full code Family Communication: Discussed with the patient's wife at bedside Disposition Plan: Observation Consults called: GI. Admission status: Observation   Dron Tanna Furry  MD [facility] Pager [phone]  If 7PM-7AM,  please contact night-coverage [email or web address] Password [facility]  10/14/2016, 4:27 PM

## 2016-10-14 NOTE — Patient Instructions (Signed)
Thank you for choosing [facility] to provide your oncology and hematology care.  To afford each patient quality time with our providers, please arrive 30 minutes before your scheduled appointment time.  If you arrive late for your appointment, you may be asked to reschedule.  We strive to give you quality time with our providers, and arriving late affects you and other patients whose appointments are after yours.   If you are a no show for multiple scheduled visits, you may be dismissed from the clinic at the providers discretion.    Again, thank you for choosing [facility], our hope is that these requests will decrease the amount of time that you wait before being seen by our physicians.  ______________________________________________________________________  Should you have questions after your visit to the [facility], please contact our office at [phone] between the hours of 8:30 and 4:30 p.m.    Voicemails left after 4:30p.m will not be returned until the following business day.    For prescription refill requests, please have your pharmacy contact us directly.  Please also try to allow 48 hours for prescription requests.    Please contact the scheduling department for questions regarding scheduling.  For scheduling of procedures such as PET scans, CT scans, MRI, Ultrasound, etc please contact central scheduling at [phone].    Resources For Cancer Patients and Caregivers:   Oncolink.org:  A wonderful resource for patients and healthcare providers for information regarding your disease, ways to tract your treatment, what to expect, etc.     American Cancer Society:  800-227-2345  Can help patients locate various types of support and financial assistance  Cancer Care: 1-800-813-HOPE (4673) Provides financial assistance, online support groups, medication/co-pay assistance.    [place] DSS:  336-641-3447 Where to apply for food  stamps, Medicaid, and utility assistance  Medicare Rights Center: 800-333-4114 Helps people with Medicare understand their rights and benefits, navigate the Medicare system, and secure the quality healthcare they deserve  SCAT: 336-333-6589 [place] [employer]'s shared-ride transportation service for eligible riders who have a disability that prevents them from riding the fixed route bus.    For additional information on assistance programs please contact our social worker:   Grier Hock/Abigail Elmore:  [phone]            

## 2016-10-14 NOTE — ED Notes (Signed)
Bed: XA75 Expected date:  Expected time:  Means of arrival:  Comments: Pt from cancer center

## 2016-10-14 NOTE — ED Notes (Signed)
Please call Briana-RN for report at [phone] by [phone]

## 2016-10-15 ENCOUNTER — Encounter (HOSPITAL_COMMUNITY)
Admission: EM | Disposition: A | Discharge status: Home / Self Care | Payer: Self-pay | Source: Home / Self Care | Attending: Emergency Medicine

## 2016-10-15 ENCOUNTER — Encounter (HOSPITAL_COMMUNITY): Payer: Self-pay | Admitting: *Deleted

## 2016-10-15 DIAGNOSIS — K7460 Unspecified cirrhosis of liver: Secondary | ICD-10-CM | POA: Diagnosis not present

## 2016-10-15 DIAGNOSIS — D500 Iron deficiency anemia secondary to blood loss (chronic): Secondary | ICD-10-CM | POA: Diagnosis not present

## 2016-10-15 DIAGNOSIS — I5032 Chronic diastolic (congestive) heart failure: Secondary | ICD-10-CM

## 2016-10-15 DIAGNOSIS — R195 Other fecal abnormalities: Secondary | ICD-10-CM | POA: Diagnosis not present

## 2016-10-15 DIAGNOSIS — I130 Hypertensive heart and chronic kidney disease with heart failure and stage 1 through stage 4 chronic kidney disease, or unspecified chronic kidney disease: Secondary | ICD-10-CM | POA: Diagnosis not present

## 2016-10-15 DIAGNOSIS — K922 Gastrointestinal hemorrhage, unspecified: Principal | ICD-10-CM

## 2016-10-15 DIAGNOSIS — D62 Acute posthemorrhagic anemia: Secondary | ICD-10-CM | POA: Diagnosis not present

## 2016-10-15 DIAGNOSIS — E1122 Type 2 diabetes mellitus with diabetic chronic kidney disease: Secondary | ICD-10-CM | POA: Diagnosis not present

## 2016-10-15 DIAGNOSIS — N189 Chronic kidney disease, unspecified: Secondary | ICD-10-CM | POA: Diagnosis not present

## 2016-10-15 DIAGNOSIS — Z951 Presence of aortocoronary bypass graft: Secondary | ICD-10-CM | POA: Diagnosis not present

## 2016-10-15 DIAGNOSIS — D509 Iron deficiency anemia, unspecified: Secondary | ICD-10-CM | POA: Diagnosis not present

## 2016-10-15 DIAGNOSIS — N179 Acute kidney failure, unspecified: Secondary | ICD-10-CM | POA: Diagnosis not present

## 2016-10-15 HISTORY — PX: GIVENS CAPSULE STUDY: SHX5432

## 2016-10-15 LAB — BASIC METABOLIC PANEL
Anion gap: 6 (ref 5–15)
BUN: 9 mg/dL (ref 6–20)
CALCIUM: 8.3 mg/dL — AB (ref 8.9–10.3)
CO2: 25 mmol/L (ref 22–32)
CREATININE: 0.95 mg/dL (ref 0.61–1.24)
Chloride: 105 mmol/L (ref 101–111)
GFR calc Af Amer: >60 mL/min (ref >60)
GFR calc non Af Amer: >60 mL/min (ref >60)
GLUCOSE: 108 mg/dL — AB (ref 65–99)
Potassium: 3.8 mmol/L (ref 3.5–5.1)
Sodium: 136 mmol/L (ref 135–145)

## 2016-10-15 LAB — TYPE AND SCREEN
ABO/RH(D): O POS
ANTIBODY SCREEN: NEGATIVE
UNIT DIVISION: 0
UNIT DIVISION: 0

## 2016-10-15 LAB — BPAM RBC
Blood Product Expiration Date: 201810042359
Blood Product Expiration Date: 201810042359
ISSUE DATE / TIME: 201809071814
ISSUE DATE / TIME: 201809072123
UNIT TYPE AND RH: 5100
Unit Type and Rh: 5100

## 2016-10-15 LAB — CBC
HEMATOCRIT: 24.5 % — AB (ref 39.0–52.0)
Hemoglobin: 8.1 g/dL — ABNORMAL LOW (ref 13.0–17.0)
MCH: 27.5 pg (ref 26.0–34.0)
MCHC: 33.1 g/dL (ref 30.0–36.0)
MCV: 83.1 fL (ref 78.0–100.0)
PLATELETS: 97 10*3/uL — AB (ref 150–400)
RBC: 2.95 MIL/uL — ABNORMAL LOW (ref 4.22–5.81)
RDW: 18.3 % — AB (ref 11.5–15.5)
WBC: 4.6 10*3/uL (ref 4.0–10.5)

## 2016-10-15 SURGERY — IMAGING PROCEDURE, GI TRACT, INTRALUMINAL, VIA CAPSULE
Anesthesia: LOCAL

## 2016-10-15 MED ORDER — SODIUM CHLORIDE 0.9 % IV SOLN
510.0000 mg | Freq: Once | INTRAVENOUS | Status: AC
  Administered 2016-10-15: 510 mg via INTRAVENOUS
  Filled 2016-10-15 (×2): qty 17

## 2016-10-15 SURGICAL SUPPLY — 1 items: TOWEL COTTON PACK 4EA (MISCELLANEOUS) ×4 IMPLANT

## 2016-10-15 NOTE — Progress Notes (Signed)
Subjective: Feeling much better after the blood transfusion.  Objective: Vital signs in last 24 hours: Temp:  [98.1 F (36.7 C)-99.7 F (37.6 C)] 98.4 F (36.9 C) (09/08 0524) Pulse Rate:  [68-88] 73 (09/08 0524) Resp:  [16-22] 16 (09/08 0524) BP: (106-143)/(50-71) 108/63 (09/08 0524) SpO2:  [94 %-100 %] 95 % (09/08 0524) Weight:  [111.1 kg (244 lb 14.9 oz)-112.4 kg (247 lb 14.4 oz)] 111.1 kg (244 lb 14.9 oz) (09/07 1800) Last BM Date: 10/14/16  Intake/Output from previous day: 09/07 0701 - 09/08 0700 In: 713.8 [Blood:713.8] Out: 800 [Urine:800] Intake/Output this shift: Total I/O In: 713.8 [Blood:713.8] Out: 800 [Urine:800]  General appearance: alert and no distress GI: soft, non-tender; bowel sounds normal; no masses,  no organomegaly  Lab Results:  Recent Labs  10/14/16 1127 10/14/16 1340 10/15/16 0606  WBC 6.1 6.5 4.6  HGB 6.9* 7.1* 8.1*  HCT 22.3* 22.4* 24.5*  PLT 106* 116* 97*   BMET  Recent Labs  10/14/16 1127 10/14/16 1340  NA 133* 136  K 4.1 3.7  CL  --  105  CO2 24 24  GLUCOSE 158* 113*  BUN 12.9 13  CREATININE 1.3 1.18  CALCIUM 8.4 8.4*   LFT  Recent Labs  10/14/16 1340  PROT 6.5  ALBUMIN 2.8*  AST 33  ALT 22  ALKPHOS 80  BILITOT 3.4*   PT/INR  Recent Labs  10/14/16 1340  LABPROT 19.3*  INR 1.64   Hepatitis Panel No results for input(s): HEPBSAG, HCVAB, HEPAIGM, HEPBIGM in the last 72 hours. C-Diff No results for input(s): CDIFFTOX in the last 72 hours. Fecal Lactopherrin No results for input(s): FECLLACTOFRN in the last 72 hours.  Studies/Results: Dg Chest 2 View  Result Date: 10/14/2016 CLINICAL DATA:  Productive cough for 2-3 days.  Anemia. EXAM: CHEST  2 VIEW COMPARISON:  07/29/2016 FINDINGS: Prior CABG. Faint interstitial accentuation bilaterally with poor definition of lung markings. Thoracic spondylosis.  No pleural effusion. IMPRESSION: 1. Chronic mildly accentuated pulmonary interstitium without overt edema or  airspace opacity. 2. Heart size normal.  Prior CABG. 3. Thoracic spondylosis. Electronically Signed   By: Van Clines M.D.   On: 10/14/2016 13:33    Medications:  Scheduled: . cholecalciferol  1,000 Units Oral Daily  . metoprolol tartrate  12.5 mg Oral BID  . omega-3 acid ethyl esters  2 capsule Oral Daily  . pantoprazole  40 mg Oral BID AC  . potassium chloride SA  20 mEq Oral BID  . spironolactone  25 mg Oral Daily  . torsemide  40 mg Oral Daily   Continuous:   Assessment/Plan: 1) Obscure GI bleed. 2) Recurrent anemia.   I spoke with Radiology yesterday about the bleeding scan.  They feel that it will be low yield as he was not actively bleeding, which I agree.  I will have the patient undergo a capsule endoscopy today.  Plan: 1) Capsule endoscopy.  LOS: 0 days   Michaila Kenney D 10/15/2016, 6:37 AM

## 2016-10-15 NOTE — Progress Notes (Signed)
PROGRESS NOTE    William [name]  BTD:[id number] DOB: 12-08-1950 DOA: 10/14/2016 PCP: Tammi Sou, MD  Brief Narrative:66 year old male who is well-known to me for his history of IDA and obscure GI bleeding, NASH cirrhosis, AAA, cholelthiasis, CAD, and hyperlipidemia admitted for recurrent anemia.  The patient was seen today with Hematology and he reported feeling weaker, Hb was 6.9 g/dL.  Since this past April the patient has undergone multiple endoscopic procedures without any overt findings of bleeding.  There is evidence of portal HTN gastropathy and small varices without red wale signs.  A capsule endoscopy was also negative for any overt bleeding source.  He was recently evaluated at [facility] with a spiral enterography without any obvious bleeding source.  A couple of jejunal polyps were removed and clipped.  S/p 2units PRBC   Assessment & Plan:    Acute on chronic GI bleeding -s/p 2 units PRBC -multiple endoscopic procedures since April, evidence of portal HTN gastropathy and small varices without red wale signs.  A capsule endoscopy was also negative then,  recently evaluated at [facility] with a double balloon enterography without any obvious bleeding source.  A couple of jejunal polyps were removed and clipped.  -GI consulting, Capsule endo planned  ACute on chronic blood loss anemia -PRBC as noted above -Iv Iron today  Chronic diastolic congestive heart failure:  -compensated - Continue home dose of diuretics, metoprolol, Aldactone  Hypertension:  -stable, on diuretics, metop  History of NASH, liver cirrhosis and chronic thrombocytopenia: -continue diuretics - Protonix.    Non-insulin treated type 2 diabetes mellitus ([place]) -not on meds, SSI now  DVT prophylaxis: SCDs Code Status: FUll Code Family Communication:Wife at bedside Disposition Plan: Home pending workuo  Consultants:  GI Hung  Subjective: Feels better after blood, breathing ok  Objective: Vitals:   10/14/16 2139 10/14/16 2205 10/15/16 0050 10/15/16 0524  BP: (!) 110/54 (!) 113/58 (!) 106/59 108/63  Pulse: 74 68 72 73  Resp: 18 20 18 16   Temp: 99 F (37.2 C) 99 F (37.2 C) 98.1 F (36.7 C) 98.4 F (36.9 C)  TempSrc: Oral Oral Oral Oral  SpO2: 96% 94% 94% 95%  Weight:      Height:        Intake/Output Summary (Last 24 hours) at 10/15/16 1239 Last data filed at 10/15/16 0807  Gross per 24 hour  Intake           713.75 ml  Output             1650 ml  Net          -936.25 ml   Filed Weights   10/14/16 1800  Weight: 111.1 kg (244 lb 14.9 oz)    Examination:  General exam: Appears calm and comfortable  Respiratory system: Clear to auscultation. Respiratory effort normal. Cardiovascular system: S1 & S2 heard, RRR. No JVD, murmurs Gastrointestinal system: Abdomen is nondistended, soft and nontender.Normal bowel sounds heard. Central nervous system: Alert and oriented. No focal neurological deficits. Extremities: Symmetric 5 x 5 power. Skin: No rashes, lesions or ulcers Psychiatry: Judgement and insight appear normal. Mood & affect appropriate.     Data Reviewed:   CBC:  Recent Labs Lab 10/14/16 1127 10/14/16 1340 10/15/16 0606  WBC 6.1 6.5 4.6  NEUTROABS 3.7 3.7  --   HGB 6.9* 7.1* 8.1*  HCT 22.3* 22.4* 24.5*  MCV 83.8 83.0 83.1  PLT 106* 116* 97*   Basic Metabolic Panel:  Recent Labs Lab  10/14/16 1127 10/14/16 1340 10/15/16 0606  NA 133* 136 136  K 4.1 3.7 3.8  CL  --  105 105  CO2 24 24 25   GLUCOSE 158* 113* 108*  BUN 12.9 13 9   CREATININE 1.3 1.18 0.95  CALCIUM 8.4 8.4* 8.3*   GFR: Estimated Creatinine Clearance: 92 mL/min (by C-G formula based on SCr of 0.95 mg/dL). Liver Function Tests:  Recent Labs Lab 10/14/16 1127 10/14/16 1340  AST 27 33  ALT 19 22  ALKPHOS 93 80  BILITOT 3.47* 3.4*  PROT 6.3* 6.5  ALBUMIN 2.5* 2.8*   No results for input(s): LIPASE, AMYLASE in the last 168 hours. No results for input(s): AMMONIA in the  last 168 hours. Coagulation Profile:  Recent Labs Lab 10/14/16 1340  INR 1.64   Cardiac Enzymes: No results for input(s): CKTOTAL, CKMB, CKMBINDEX, TROPONINI in the last 168 hours. BNP (last 3 results)  Recent Labs  07/28/16 1416  PROBNP 120   HbA1C: No results for input(s): HGBA1C in the last 72 hours. CBG: No results for input(s): GLUCAP in the last 168 hours. Lipid Profile: No results for input(s): CHOL, HDL, LDLCALC, TRIG, CHOLHDL, LDLDIRECT in the last 72 hours. Thyroid Function Tests: No results for input(s): TSH, T4TOTAL, FREET4, T3FREE, THYROIDAB in the last 72 hours. Anemia Panel:  Recent Labs  10/14/16 1127  RETICCTPCT 3.03*   Urine analysis:    Component Value Date/Time   COLORURINE YELLOW 10/14/2016 [place] 10/14/2016 1418   LABSPEC 1.019 10/14/2016 1418   PHURINE 5.0 10/14/2016 1418   GLUCOSEU NEGATIVE 10/14/2016 1418   HGBUR NEGATIVE 10/14/2016 1418   BILIRUBINUR NEGATIVE 10/14/2016 1418   KETONESUR NEGATIVE 10/14/2016 1418   PROTEINUR NEGATIVE 10/14/2016 1418   NITRITE NEGATIVE 10/14/2016 1418   LEUKOCYTESUR NEGATIVE 10/14/2016 1418   Sepsis Labs: @LABRCNTIP (procalcitonin:4,lacticidven:4)  ) Recent Results (from the past 240 hour(s))  TECHNOLOGIST REVIEW     Status: None   Collection Time: 10/06/16 11:57 AM  Result Value Ref Range Status   Technologist Review occ rbc fragments and large plts seen  Final  TECHNOLOGIST REVIEW     Status: None   Collection Time: 10/14/16 11:27 AM  Result Value Ref Range Status   Technologist Review Large & giant platelets, rare meta, few fragments  Final         Radiology Studies: Dg Chest 2 View  Result Date: 10/14/2016 CLINICAL DATA:  Productive cough for 2-3 days.  Anemia. EXAM: CHEST  2 VIEW COMPARISON:  07/29/2016 FINDINGS: Prior CABG. Faint interstitial accentuation bilaterally with poor definition of lung markings. Thoracic spondylosis.  No pleural effusion. IMPRESSION: 1.  Chronic mildly accentuated pulmonary interstitium without overt edema or airspace opacity. 2. Heart size normal.  Prior CABG. 3. Thoracic spondylosis. Electronically Signed   By: Van Clines M.D.   On: 10/14/2016 13:33        Scheduled Meds: . cholecalciferol  1,000 Units Oral Daily  . metoprolol tartrate  12.5 mg Oral BID  . omega-3 acid ethyl esters  2 capsule Oral Daily  . pantoprazole  40 mg Oral BID AC  . potassium chloride SA  20 mEq Oral BID  . spironolactone  25 mg Oral Daily  . torsemide  40 mg Oral Daily   Continuous Infusions:   LOS: 0 days    Time spent: 50mn    PDomenic Polite MD [facility] Pager [phone] If 7PM-7AM, please contact night-coverage [email or web address] Password [email or web address]9/09/2016, 12:39 PM

## 2016-10-15 NOTE — Progress Notes (Signed)
Pt ingested givens capsule at 1755.  Instructions given to patient and wife and bedside RN.  Leads and recorder can be removed at 0555 on 9/9.  Vista Lawman, RN

## 2016-10-16 ENCOUNTER — Encounter: Payer: Self-pay | Admitting: Family Medicine

## 2016-10-16 DIAGNOSIS — K31811 Angiodysplasia of stomach and duodenum with bleeding: Secondary | ICD-10-CM | POA: Diagnosis not present

## 2016-10-16 DIAGNOSIS — D500 Iron deficiency anemia secondary to blood loss (chronic): Secondary | ICD-10-CM | POA: Diagnosis not present

## 2016-10-16 DIAGNOSIS — R195 Other fecal abnormalities: Secondary | ICD-10-CM | POA: Diagnosis not present

## 2016-10-16 DIAGNOSIS — N189 Chronic kidney disease, unspecified: Secondary | ICD-10-CM | POA: Diagnosis not present

## 2016-10-16 DIAGNOSIS — I130 Hypertensive heart and chronic kidney disease with heart failure and stage 1 through stage 4 chronic kidney disease, or unspecified chronic kidney disease: Secondary | ICD-10-CM | POA: Diagnosis not present

## 2016-10-16 DIAGNOSIS — N179 Acute kidney failure, unspecified: Secondary | ICD-10-CM | POA: Diagnosis not present

## 2016-10-16 DIAGNOSIS — I5032 Chronic diastolic (congestive) heart failure: Secondary | ICD-10-CM | POA: Diagnosis not present

## 2016-10-16 DIAGNOSIS — D509 Iron deficiency anemia, unspecified: Secondary | ICD-10-CM | POA: Diagnosis not present

## 2016-10-16 DIAGNOSIS — K7460 Unspecified cirrhosis of liver: Secondary | ICD-10-CM | POA: Diagnosis not present

## 2016-10-16 DIAGNOSIS — K922 Gastrointestinal hemorrhage, unspecified: Secondary | ICD-10-CM | POA: Diagnosis not present

## 2016-10-16 DIAGNOSIS — Z951 Presence of aortocoronary bypass graft: Secondary | ICD-10-CM | POA: Diagnosis not present

## 2016-10-16 DIAGNOSIS — E1122 Type 2 diabetes mellitus with diabetic chronic kidney disease: Secondary | ICD-10-CM | POA: Diagnosis not present

## 2016-10-16 DIAGNOSIS — D62 Acute posthemorrhagic anemia: Secondary | ICD-10-CM | POA: Diagnosis not present

## 2016-10-16 LAB — CBC
HCT: 28.1 % — ABNORMAL LOW (ref 39.0–52.0)
Hemoglobin: 9.1 g/dL — ABNORMAL LOW (ref 13.0–17.0)
MCH: 26.3 pg (ref 26.0–34.0)
MCHC: 32.4 g/dL (ref 30.0–36.0)
MCV: 81.2 fL (ref 78.0–100.0)
Platelets: 111 10*3/uL — ABNORMAL LOW (ref 150–400)
RBC: 3.46 MIL/uL — AB (ref 4.22–5.81)
RDW: 18.4 % — AB (ref 11.5–15.5)
WBC: 4.4 10*3/uL (ref 4.0–10.5)

## 2016-10-16 NOTE — Progress Notes (Addendum)
I will read his capsule endoscopy today and hopefully make a disposition for him.  ADDENDUM:  The capsule endoscopy is positive for a small bowel bleeding site.  There was active bleeding, but not profuse bleeding.  I suspect that it is a bleeding AVM.  Unfortunately, the lesion is father than I can access with the endoscopes.  We will arrange for follow up in the office for a CBC and hopefully prevent readmission with outpatient blood transfusion.  Arrangements will be made for him to return to [facility] for a spiral enteroscopy in hopes of resolving the bleeding issue.

## 2016-10-16 NOTE — Progress Notes (Signed)
Entered patient's room at about 5:45 with bedside RN.  Patient voiced that the blue light on the recorder was no longer blinking at this time, but he had seen it blinking at around 3am before he tried to go back to sleep.  This RN did not see the light blinking as well and main screen did not illuminate after several attempts to turn on machine.  Recorder was removed at 5:48.  Informed patient that video should be ready some time today.  Recorder was docked and video creation was initiated at about 0620.  Will make Dr. [name] aware later today.

## 2016-10-17 ENCOUNTER — Encounter (HOSPITAL_COMMUNITY): Payer: Self-pay | Admitting: Gastroenterology

## 2016-10-17 DIAGNOSIS — D509 Iron deficiency anemia, unspecified: Secondary | ICD-10-CM | POA: Diagnosis not present

## 2016-10-18 ENCOUNTER — Telehealth: Payer: Self-pay

## 2016-10-18 ENCOUNTER — Encounter: Payer: Self-pay | Admitting: Hematology

## 2016-10-18 DIAGNOSIS — D500 Iron deficiency anemia secondary to blood loss (chronic): Secondary | ICD-10-CM | POA: Diagnosis not present

## 2016-10-18 NOTE — Telephone Encounter (Signed)
Left VM for pt in regards to a MyChart message. Pt does not need iron on 9/14. Appt cancelled. Pt to keep iron infusion for 9/21. F/u with Dr. Irene Limbo in one month. Scheduling message sent to add lab and doctor visit.

## 2016-10-18 NOTE — Discharge Summary (Signed)
Physician Discharge Summary  William [name] TFT:[id number] DOB: [birth year] DOA: 10/14/2016  PCP: Tammi Sou, MD  Admit date: 10/14/2016 Discharge date: 10/16/2016  Time spent: 35 minutes  Recommendations for Outpatient Follow-up:  Dr.Hung's office on Monday 9/10 with CBC, Dr.Hung working on setting up quick FU with [facility] for double balloon enteroscopy with ablation of small bowel bleeding site  Discharge Diagnoses:    Chronic GI Bleeding   Acute Blood loss anemia   Hx of CABG   Essential hypertension   Acute on chronic renal failure (HCC)   Non-insulin treated type 2 diabetes mellitus (HCC)   Chronic diastolic CHF (congestive heart failure) (HCC)   Iron deficiency anemia due to chronic blood loss   Cirrhosis, nonalcoholic ([place])   Discharge Condition: stable Diet recommendation: heart healthy  Filed Weights   10/14/16 1800  Weight: 111.1 kg (244 lb 14.9 oz)    History of present illness:  66 year old male who is well-known to me for his history of IDA and obscure GI bleeding, NASH cirrhosis, AAA, cholelthiasis, CAD, and hyperlipidemia admitted for recurrent anemia. The patient was seen today with Hematology and he reported feeling weaker, Hb was 6.9 g/dL. Since this past April the patient has undergone multiple endoscopic procedures without any overt findings of bleeding. There is evidence of portal HTN gastropathy and small varices without red wale signs. A capsule endoscopy was also negative for any overt bleeding source. He was recently evaluated at [facility] with a spiral enterography without any obvious bleeding source. A couple of jejunal polyps were removed and clipped.   Hospital Course:   Acute on chronic GI bleeding -s/p 2 units PRBC -multiple endoscopic procedures since April, evidence of portal HTN gastropathy and small varices without red wale signs. A capsule endoscopy was also negative then,  recently evaluated at [facility] with a double balloon enterography  without any obvious bleeding source. A couple of jejunal polyps were removed and clipped.  -GI Dr.Hung consulted, Capsule endo completed yesterday which showed mild small bowel bleeding, pt was seen by his gastroenterologist dr.Hung this morning who recommended DC home today with FU tomorrow in his office for lab draw and he will arrange outpt double balloon enteroscopy with ablation of small bowel bleeding site at [facility] like he had last week  ACute on chronic blood loss anemia -s/p 2 units PRBC 9/8 and Iv Iron   Chronic diastolic congestive heart failure:  -compensated - Continue home dose of diuretics, metoprolol, Aldactone  Hypertension:  -stable, on diuretics, metoprolol  History of NASH, liver cirrhosis and chronic thrombocytopenia: -continue diuretics, Protonix.    Non-insulin treated type 2 diabetes mellitus ([place]) -not on meds, SSI used inpatient   Procedures:  Capsule Endosocopy  Consultations:  Gi Hung  Discharge Exam: Vitals:   10/15/16 2012 10/16/16 0533  BP: 119/65 112/64  Pulse: 76 67  Resp: 18 16  Temp: 98.1 F (36.7 C) 97.9 F (36.6 C)  SpO2: 94% 95%    General: AAOx3 Cardiovascular: S1S2/RRR Respiratory: CTAB  Discharge Instructions   Discharge Instructions    Diet - low sodium heart healthy    Complete by:  As directed    Increase activity slowly    Complete by:  As directed      Discharge Medication List as of 10/16/2016 11:14 AM    CONTINUE these medications which have NOT CHANGED   Details  acetaminophen (TYLENOL) 325 MG tablet Take 650 mg by mouth every 6 (six) hours as needed for  fever., Historical Med    cholecalciferol (VITAMIN D) 1000 units tablet Take 1,000 Units by mouth daily., Historical Med    docusate sodium (COLACE) 100 MG capsule Take 100 mg by mouth 2 (two) times daily as needed for mild constipation (1 - 2 tabs as needed)., Historical Med    fluticasone (FLONASE) 50 MCG/ACT nasal spray Place 2 sprays into both  nostrils daily., Starting Fri 01/01/2016, Normal    metoprolol tartrate (LOPRESSOR) 25 MG tablet Take 0.5 tablets (12.5 mg total) by mouth 2 (two) times daily., Starting Wed 06/22/2016, Normal    omega-3 acid ethyl esters (LOVAZA) 1 g capsule TAKE 2 CAPSULES BY MOUTH ONCE DAILY, Normal    pantoprazole (PROTONIX) 40 MG tablet Take 1 tablet (40 mg total) by mouth 2 (two) times daily before a meal., Starting Tue 10/11/2016, Normal    potassium chloride SA (K-DUR,KLOR-CON) 20 MEQ tablet Take 1 tablet (20 mEq total) by mouth 2 (two) times daily., Starting Wed 06/22/2016, Normal    spironolactone (ALDACTONE) 25 MG tablet Take 1 tablet (25 mg total) by mouth daily., Starting Wed 06/22/2016, Normal    torsemide (DEMADEX) 20 MG tablet Take 2 tablets (40 mg total) by mouth daily., Starting Wed 06/22/2016, Normal    Evolocumab (REPATHA SURECLICK) 196 MG/ML SOAJ Inject 140 mg into the skin every 14 (fourteen) days., Starting Sat 10/01/2016, Print       Allergies  Allergen Reactions  . Statins Other (See Comments)    Liver enzymes go up whenever on them   Follow-up Information    Carol Ada, MD Follow up on 10/17/2016.   Specialty:  Gastroenterology Why:  for Blood work Contact information: [place], [place] [address] [phone]        [place], Gaspar Skeeters, MD Follow up in 1 week(s).   Specialty:  Gastroenterology Why:  Dr.Hung to arrange Follow up Contact information: [address] [place] [address] [phone]            The results of significant diagnostics from this hospitalization (including imaging, microbiology, ancillary and laboratory) are listed below for reference.    Significant Diagnostic Studies: Dg Chest 2 View  Result Date: 10/14/2016 CLINICAL DATA:  Productive cough for 2-3 days.  Anemia. EXAM: CHEST  2 VIEW COMPARISON:  07/29/2016 FINDINGS: Prior CABG. Faint interstitial accentuation bilaterally with poor definition of lung markings.  Thoracic spondylosis.  No pleural effusion. IMPRESSION: 1. Chronic mildly accentuated pulmonary interstitium without overt edema or airspace opacity. 2. Heart size normal.  Prior CABG. 3. Thoracic spondylosis. Electronically Signed   By: Van Clines M.D.   On: 10/14/2016 13:33    Microbiology: Recent Results (from the past 240 hour(s))  TECHNOLOGIST REVIEW     Status: None   Collection Time: 10/14/16 11:27 AM  Result Value Ref Range Status   Technologist Review Large & giant platelets, rare meta, few fragments  Final     Labs: Basic Metabolic Panel:  Recent Labs Lab 10/14/16 1127 10/14/16 1340 10/15/16 0606  NA 133* 136 136  K 4.1 3.7 3.8  CL  --  105 105  CO2 24 24 25   GLUCOSE 158* 113* 108*  BUN 12.9 13 9   CREATININE 1.3 1.18 0.95  CALCIUM 8.4 8.4* 8.3*   Liver Function Tests:  Recent Labs Lab 10/14/16 1127 10/14/16 1340  AST 27 33  ALT 19 22  ALKPHOS 93 80  BILITOT 3.47* 3.4*  PROT 6.3* 6.5  ALBUMIN 2.5* 2.8*   No results for  input(s): LIPASE, AMYLASE in the last 168 hours. No results for input(s): AMMONIA in the last 168 hours. CBC:  Recent Labs Lab 10/14/16 1127 10/14/16 1340 10/15/16 0606 10/16/16 0822  WBC 6.1 6.5 4.6 4.4  NEUTROABS 3.7 3.7  --   --   HGB 6.9* 7.1* 8.1* 9.1*  HCT 22.3* 22.4* 24.5* 28.1*  MCV 83.8 83.0 83.1 81.2  PLT 106* 116* 97* 111*   Cardiac Enzymes: No results for input(s): CKTOTAL, CKMB, CKMBINDEX, TROPONINI in the last 168 hours. BNP: BNP (last 3 results)  Recent Labs  05/04/16 1012 05/22/16 0713 05/26/16 0307  BNP 574.6* 765.6* 343.4*    ProBNP (last 3 results)  Recent Labs  07/28/16 1416  PROBNP 120    CBG: No results for input(s): GLUCAP in the last 168 hours.     SignedDomenic Polite MD.  [facility] 10/18/2016, 3:15 PM

## 2016-10-19 ENCOUNTER — Telehealth: Payer: Self-pay | Admitting: Hematology

## 2016-10-19 NOTE — Telephone Encounter (Signed)
Left message for patient regarding his follow up in October. Sending him a reminder letter.

## 2016-10-21 ENCOUNTER — Ambulatory Visit: Payer: 59

## 2016-10-23 ENCOUNTER — Encounter: Payer: Self-pay | Admitting: Hematology

## 2016-10-25 ENCOUNTER — Other Ambulatory Visit: Payer: Self-pay

## 2016-10-25 DIAGNOSIS — K922 Gastrointestinal hemorrhage, unspecified: Secondary | ICD-10-CM

## 2016-10-25 MED FILL — METOPROLOL TARTRATE 25 MG T: 25 | 60 days supply | Qty: 60 | Fill #2

## 2016-10-28 ENCOUNTER — Other Ambulatory Visit (HOSPITAL_BASED_OUTPATIENT_CLINIC_OR_DEPARTMENT_OTHER): Payer: 59

## 2016-10-28 ENCOUNTER — Ambulatory Visit (HOSPITAL_BASED_OUTPATIENT_CLINIC_OR_DEPARTMENT_OTHER): Payer: 59

## 2016-10-28 VITALS — BP 97/59 | HR 66 | Temp 97.8°F | Resp 16

## 2016-10-28 DIAGNOSIS — D500 Iron deficiency anemia secondary to blood loss (chronic): Secondary | ICD-10-CM

## 2016-10-28 DIAGNOSIS — K922 Gastrointestinal hemorrhage, unspecified: Secondary | ICD-10-CM

## 2016-10-28 LAB — CBC WITH DIFFERENTIAL/PLATELET
BASO%: 1.1 % (ref 0.0–2.0)
BASOS ABS: 0.1 10*3/uL (ref 0.0–0.1)
EOS%: 8.2 % — ABNORMAL HIGH (ref 0.0–7.0)
Eosinophils Absolute: 0.5 10*3/uL (ref 0.0–0.5)
HEMATOCRIT: 28.6 % — AB (ref 38.4–49.9)
HEMOGLOBIN: 9 g/dL — AB (ref 13.0–17.1)
LYMPH#: 1 10*3/uL (ref 0.9–3.3)
LYMPH%: 18.3 % (ref 14.0–49.0)
MCH: 28.4 pg (ref 27.2–33.4)
MCHC: 31.5 g/dL — AB (ref 32.0–36.0)
MCV: 90.2 fL (ref 79.3–98.0)
MONO#: 0.9 10*3/uL (ref 0.1–0.9)
MONO%: 16.8 % — AB (ref 0.0–14.0)
NEUT#: 3.1 10*3/uL (ref 1.5–6.5)
NEUT%: 55.6 % (ref 39.0–75.0)
PLATELETS: 105 10*3/uL — AB (ref 140–400)
RBC: 3.17 10*6/uL — ABNORMAL LOW (ref 4.20–5.82)
RDW: 23.1 % — ABNORMAL HIGH (ref 11.0–14.6)
WBC: 5.6 10*3/uL (ref 4.0–10.3)

## 2016-10-28 MED ORDER — SODIUM CHLORIDE 0.9 % IV SOLN
INTRAVENOUS | Status: DC
  Administered 2016-10-28: 11:00:00 via INTRAVENOUS

## 2016-10-28 MED ORDER — SODIUM CHLORIDE 0.9 % IV SOLN
750.0000 mg | Freq: Once | INTRAVENOUS | Status: AC
  Administered 2016-10-28: 750 mg via INTRAVENOUS
  Filled 2016-10-28: qty 15

## 2016-10-28 NOTE — Patient Instructions (Signed)
Ferric carboxymaltose injection What is this medicine? FERRIC CARBOXYMALTOSE (ferr-ik car-box-ee-mol-toes) is an iron complex. Iron is used to make healthy red blood cells, which carry oxygen and nutrients throughout the body. This medicine is used to treat anemia in people with chronic kidney disease or people who cannot take iron by mouth. This medicine may be used for other purposes; ask your health care provider or pharmacist if you have questions. COMMON BRAND NAME(S): Injectafer What should I tell my health care provider before I take this medicine? They need to know if you have any of these conditions: -anemia not caused by low iron levels -high levels of iron in the blood -liver disease -an unusual or allergic reaction to iron, other medicines, foods, dyes, or preservatives -pregnant or trying to get pregnant -breast-feeding How should I use this medicine? This medicine is for infusion into a vein. It is given by a health care professional in a hospital or clinic setting. Talk to your pediatrician regarding the use of this medicine in children. Special care may be needed. Overdosage: If you think you have taken too much of this medicine contact a poison control center or emergency room at once. NOTE: This medicine is only for you. Do not share this medicine with others. What if I miss a dose? It is important not to miss your dose. Call your doctor or health care professional if you are unable to keep an appointment. What may interact with this medicine? Do not take this medicine with any of the following medications: -deferoxamine -dimercaprol -other iron products This medicine may also interact with the following medications: -chloramphenicol -deferasirox This list may not describe all possible interactions. Give your health care provider a list of all the medicines, herbs, non-prescription drugs, or dietary supplements you use. Also tell them if you smoke, drink alcohol, or use  illegal drugs. Some items may interact with your medicine. What should I watch for while using this medicine? Visit your doctor or health care professional regularly. Tell your doctor if your symptoms do not start to get better or if they get worse. You may need blood work done while you are taking this medicine. You may need to follow a special diet. Talk to your doctor. Foods that contain iron include: whole grains/cereals, dried fruits, beans, or peas, leafy green vegetables, and organ meats (liver, kidney). What side effects may I notice from receiving this medicine? Side effects that you should report to your doctor or health care professional as soon as possible: -allergic reactions like skin rash, itching or hives, swelling of the face, lips, or tongue -breathing problems -changes in blood pressure -feeling faint or lightheaded, falls -flushing, sweating, or hot feelings Side effects that usually do not require medical attention (report to your doctor or health care professional if they continue or are bothersome): -changes in taste -constipation -dizziness -headache -nausea -pain, redness, or irritation at site where injected -vomiting This list may not describe all possible side effects. Call your doctor for medical advice about side effects. You may report side effects to FDA at 1-800-FDA-1088. Where should I keep my medicine? This drug is given in a hospital or clinic and will not be stored at home. NOTE: This sheet is a summary. It may not cover all possible information. If you have questions about this medicine, talk to your doctor, pharmacist, or health care provider.  2018 Elsevier/Gold Standard (2015-02-26 11:20:47)  

## 2016-11-01 ENCOUNTER — Emergency Department (HOSPITAL_COMMUNITY)
Admission: EM | Admit: 2016-11-01 | Discharge: 2016-11-02 | Disposition: A | Discharge status: Home / Self Care | Payer: 59 | Attending: Emergency Medicine | Admitting: Emergency Medicine

## 2016-11-01 ENCOUNTER — Emergency Department (HOSPITAL_COMMUNITY): Payer: 59

## 2016-11-01 ENCOUNTER — Other Ambulatory Visit: Payer: Self-pay

## 2016-11-01 DIAGNOSIS — Z87891 Personal history of nicotine dependence: Secondary | ICD-10-CM | POA: Insufficient documentation

## 2016-11-01 DIAGNOSIS — E782 Mixed hyperlipidemia: Secondary | ICD-10-CM | POA: Diagnosis not present

## 2016-11-01 DIAGNOSIS — R112 Nausea with vomiting, unspecified: Secondary | ICD-10-CM | POA: Insufficient documentation

## 2016-11-01 DIAGNOSIS — I110 Hypertensive heart disease with heart failure: Secondary | ICD-10-CM | POA: Insufficient documentation

## 2016-11-01 DIAGNOSIS — R1013 Epigastric pain: Secondary | ICD-10-CM | POA: Diagnosis not present

## 2016-11-01 DIAGNOSIS — Z951 Presence of aortocoronary bypass graft: Secondary | ICD-10-CM | POA: Diagnosis not present

## 2016-11-01 DIAGNOSIS — K8020 Calculus of gallbladder without cholecystitis without obstruction: Secondary | ICD-10-CM | POA: Diagnosis not present

## 2016-11-01 DIAGNOSIS — Z79899 Other long term (current) drug therapy: Secondary | ICD-10-CM | POA: Insufficient documentation

## 2016-11-01 DIAGNOSIS — I2510 Atherosclerotic heart disease of native coronary artery without angina pectoris: Secondary | ICD-10-CM | POA: Insufficient documentation

## 2016-11-01 DIAGNOSIS — E119 Type 2 diabetes mellitus without complications: Secondary | ICD-10-CM | POA: Diagnosis not present

## 2016-11-01 DIAGNOSIS — I5032 Chronic diastolic (congestive) heart failure: Secondary | ICD-10-CM | POA: Insufficient documentation

## 2016-11-01 DIAGNOSIS — R079 Chest pain, unspecified: Secondary | ICD-10-CM | POA: Diagnosis not present

## 2016-11-01 DIAGNOSIS — K5730 Diverticulosis of large intestine without perforation or abscess without bleeding: Secondary | ICD-10-CM | POA: Diagnosis not present

## 2016-11-01 DIAGNOSIS — R109 Unspecified abdominal pain: Secondary | ICD-10-CM | POA: Diagnosis present

## 2016-11-01 LAB — COMPREHENSIVE METABOLIC PANEL
ALK PHOS: 81 U/L (ref 38–126)
ALT: 38 U/L (ref 17–63)
AST: 54 U/L — ABNORMAL HIGH (ref 15–41)
Albumin: 2.9 g/dL — ABNORMAL LOW (ref 3.5–5.0)
Anion gap: 9 (ref 5–15)
BUN: 13 mg/dL (ref 6–20)
CALCIUM: 8.9 mg/dL (ref 8.9–10.3)
CO2: 22 mmol/L (ref 22–32)
CREATININE: 1.51 mg/dL — AB (ref 0.61–1.24)
Chloride: 104 mmol/L (ref 101–111)
GFR, EST AFRICAN AMERICAN: 53 mL/min — AB (ref >60)
GFR, EST NON AFRICAN AMERICAN: 46 mL/min — AB (ref >60)
Glucose, Bld: 129 mg/dL — ABNORMAL HIGH (ref 65–99)
Potassium: 4.3 mmol/L (ref 3.5–5.1)
Sodium: 135 mmol/L (ref 135–145)
Total Bilirubin: 5.5 mg/dL — ABNORMAL HIGH (ref 0.3–1.2)
Total Protein: 6.6 g/dL (ref 6.5–8.1)

## 2016-11-01 LAB — URINALYSIS, ROUTINE W REFLEX MICROSCOPIC
BILIRUBIN URINE: NEGATIVE
Glucose, UA: NEGATIVE mg/dL
HGB URINE DIPSTICK: NEGATIVE
KETONES UR: NEGATIVE mg/dL
Leukocytes, UA: NEGATIVE
Nitrite: NEGATIVE
PROTEIN: NEGATIVE mg/dL
Specific Gravity, Urine: 1.023 (ref 1.005–1.030)
pH: 5 (ref 5.0–8.0)

## 2016-11-01 LAB — CBC
HCT: 29 % — ABNORMAL LOW (ref 39.0–52.0)
Hemoglobin: 8.9 g/dL — ABNORMAL LOW (ref 13.0–17.0)
MCH: 28.8 pg (ref 26.0–34.0)
MCHC: 30.7 g/dL (ref 30.0–36.0)
MCV: 93.9 fL (ref 78.0–100.0)
PLATELETS: 166 10*3/uL (ref 150–400)
RBC: 3.09 MIL/uL — AB (ref 4.22–5.81)
RDW: 25.1 % — AB (ref 11.5–15.5)
WBC: 22.8 10*3/uL — AB (ref 4.0–10.5)

## 2016-11-01 LAB — I-STAT TROPONIN, ED
TROPONIN I, POC: 0 ng/mL (ref 0.00–0.08)
Troponin i, poc: 0 ng/mL (ref 0.00–0.08)

## 2016-11-01 LAB — LIPASE, BLOOD: Lipase: 33 U/L (ref 11–51)

## 2016-11-01 MED ORDER — SODIUM CHLORIDE 0.9 % IV BOLUS (SEPSIS)
500.0000 mL | Freq: Once | INTRAVENOUS | Status: AC
Start: 2016-11-01 — End: 2016-11-01
  Administered 2016-11-01: 500 mL via INTRAVENOUS

## 2016-11-01 MED ORDER — ONDANSETRON HCL 4 MG PO TABS
4.0000 mg | ORAL_TABLET | Freq: Three times a day (TID) | ORAL | 0 refills | Status: DC | PRN
Start: 2016-11-01 — End: 2016-11-20

## 2016-11-01 MED ORDER — MORPHINE SULFATE (PF) 4 MG/ML IV SOLN
4.0000 mg | Freq: Once | INTRAVENOUS | Status: AC
  Administered 2016-11-01: 4 mg via INTRAVENOUS
  Filled 2016-11-01: qty 1

## 2016-11-01 MED ORDER — IOPAMIDOL (ISOVUE-300) INJECTION 61%
INTRAVENOUS | Status: AC
  Administered 2016-11-01: 100 mL
  Filled 2016-11-01: qty 100

## 2016-11-01 NOTE — ED Notes (Signed)
Pt eating apple sauce and drinking.  Tolerating well.  No complaints voiced at this time

## 2016-11-01 NOTE — ED Triage Notes (Addendum)
Pt seen oncology Friday due to anemia. He has had 19 iron transfusions over time due to anemia. He had iron infusion Friday and over the weekend had n/v since. Pt now having "burning" in epigastric area. Pt also had some "palpitations" per his wife. Pt also has "tightness" in abd area. Normal urination noted. Some diarrhea noted. Pt feeling extremely weak. Pt also has lower back pain around kidney area. Pt has CHF hx.

## 2016-11-01 NOTE — Discharge Instructions (Signed)
Please see the information and instructions below regarding your visit.  Your diagnoses today include:  1. Non-intractable vomiting with nausea, unspecified vomiting type    Your exam and testing is reassuring today. Your symptoms are likely caused by a viral gastroenteritis, however we would like you to have close follow-up to make sure that she will fluid status and symptoms are not changing.  Tests performed today include: See side panel of your discharge paperwork for testing performed today. Vital signs are listed at the bottom of these instructions.   -CT scan of the abdomen and pelvis. -Blood counts. Hemoglobin 8.9 today. -Electrolytes, kidney function, and liver function. -Heart enzymes. -EKG.  Medications prescribed:   1. Zofran. This is an antinausea medication.  Take any prescribed medications only as prescribed, and any over the counter medications only as directed on the packaging.  Home care instructions:  Please follow any educational materials contained in this packet.   Please start reintroducing foods that are bland, such as applesauce, crackers, toast. Please keep drinking fluids throughout the day.  Follow-up instructions: Please follow-up with your primary care provider in the next couple days for further evaluation of your symptoms.   Return instructions:  Please return to the Emergency Department if you experience worsening symptoms. Please return the emergency Department for any worsening abdominal pain, nausea and vomiting where you cannot keep anything by mouth down, chest pain, shortness of breath, dizziness or lightheadedness where you feel that you are about to pass out, or active bleeding in vomit or stool. Please return if you have any other emergent concerns.  Additional Information:  Your vital signs today were: BP 131/65    Pulse (!) 113    Temp 99.1 F (37.3 C) (Oral)    Resp 20    Ht 5' 9"  (1.753 m)    Wt 110.2 kg (243 lb)    SpO2 97%    BMI  35.88 kg/m  If your blood pressure (BP) was elevated on multiple readings during this visit above 130 for the top number or above 80 for the bottom number, please have this repeated by your primary care provider within one month. --------------  Thank you for allowing [place] to participate in your care today.

## 2016-11-01 NOTE — ED Provider Notes (Signed)
[facility] DEPT Provider Note   CSN: [id number] Arrival date & time: 11/01/16  0844     History   Chief Complaint Chief Complaint  Patient presents with  . Abdominal Pain  . Chest Pain    HPI William [name] is a 66 y.o. male.  HPI   Patient is a 66 year old male with a history of diabetes mellitus, hypertension, chronic GI bleeding of unknown source with subsequent iron deficiency anemia, nonalcoholic fatty liver disease, CAD (history of bypass) presenting for 2-3 day history of a feeling of abdominal bloating and pressure. Patient reports that this sensation began after his iron infusion on Friday, 10/28/2016. Patient reports he has had episodes of abdominal bloating in the past, however it has not been this severe. Patient subsequently had 2-3 episodes of diarrhea over the weekend and began vomiting after a social gathering. Patient reports he has noted dark stools, however this was after his iron infusion. Patient reports that for the past 24 hours he has not been able to keep anything by mouth down. Emesis is nonbilious and nonbloody. Patient reports that in addition to the pressure in his abdomen he is experiencing a "burning" sensation that is approximately 5 out of 10 in severity. Patient has not tried anything besides his usual Protonix for his symptoms. Patient is denying any chest pain or pressure at this time, however has had some increased work of breathing noted by his wife. Patient reports a dry, nonproductive cough as well as chills and evening fevers, however these have been intermittent over the past 6 months during the period of this chronic GI bleeding. Patient denies any syncopal or presyncopal episodes, however is frequently dizzy and lightheaded due to his anemia. Patient reports no history of surgical removal of abdominal organs.  On chart review,   Past Medical History:  Diagnosis Date  . AAA (abdominal aortic aneurysm) ([place]) 03/2014   3.2 cm on MR abd.  F/u  aortic u/s 06/2014 showed 3.0 x 3.1 cm AAA: recheck 2 yrs recommended (followed by cardiologist)  . Anemia   . Bilateral renal cysts    simple (03/2014 MRI)  . CAD (coronary artery disease)   . Cholelithiases 2018   asymptomatic  . Chronic diastolic heart failure ([place])   . Cirrhosis ([place]) 05/2016   secondary to NASH; most recent u/s abd 07/2016 showed splenomegaly.  Hx of portal HTN changes with mild ascites and splenomegaly.  . Diabetes mellitus with complication ([place]) 13/1438   A1c 6.8%  . History of blood transfusion 2018 X 4 dates   "low blood" (09/21/2016)  . Hyperlipemia, mixed    elevated LFTs when on statins.    . Hypertension    Cr bump 04/01/16 so I changed benicar-hct to benicar plain and added amlodipine 5 mg.  . Iron deficiency anemia 05/2016   Acute blood loss anemia: hospitalized, required transfusion x 3 U: colonoscopy and capsule study unrevealing.  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  W/u for ? hemolytic anemia to be pursued by hematologist as outpt.  Appt with Dr. Cristi Loron at [facility] 09/13/16.  . Microscopic hematuria    Eval unremarkable by Dr. Eulogio Ditch.  [name] NASH (nonalcoholic steatohepatitis)    Fatty liver on MR abd 03/2014, + hx of elevated transaminases and bili: followed by Dr. [name].  . Obesity   . Spleen enlarged     Patient Active Problem List   Diagnosis  Date Noted  . Anemia 10/14/2016  . Chronic GI bleeding   . Acute blood loss anemia 08/31/2016  . Iron deficiency anemia due to chronic blood loss 08/31/2016  . Acute GI bleeding 08/31/2016  . Cirrhosis, nonalcoholic ([place]) 09/38/1829  . Portal hypertension ([place]) 08/31/2016  . Splenomegaly 08/31/2016  . Esophageal varices ([place]) 08/31/2016  . Symptomatic anemia 07/30/2016  . Thrombocytopenia ([place]) 07/29/2016  . Weakness 07/28/2016  . Aortic stenosis, mild 07/28/2016  . Chronic diastolic CHF (congestive heart  failure) ([place])   . History of GI bleed 05/22/2016  . Pulmonary edema 05/22/2016  . Acute on chronic renal failure ([place]) 05/22/2016  . Non-insulin treated type 2 diabetes mellitus ([place]) 05/22/2016  . Moderate obesity 11/15/2013  . Abdominal aortic aneurysm ([place]) 03/19/2013  . Hematuria 03/19/2013  . Morbid obesity ([place]) 08/24/2012  . 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 Metabolic syndrome 93/71/6967  . Essential hypertension 08/24/2012  . Mixed hyperlipidemia 08/24/2012    Past Surgical History:  Procedure Laterality Date  . CARDIAC CATHETERIZATION    . CARDIOVASCULAR STRESS TEST  01/17/2012; 05/04/16   Normal stress nuclear study x 2 (2018--Normal perfusion. LVEF 66% with normal wall motion. This is a low risk study).  . CERVICAL DISCECTOMY  1992  . COLONOSCOPY N/A 05/27/2016   No site/explanation for blood loss found.  Erythematous mucosa in cecum and ascending colon--Cecal bx: normal.  Procedure: COLONOSCOPY;  Surgeon: Carol Ada, MD;  Location: [facility] ENDOSCOPY;  Service: Endoscopy;  Laterality: N/A;  . COLONOSCOPY W/ POLYPECTOMY  09/10/2014   Polypectomy x 2: recall 5 yrs (Dr. [name]).  . CORONARY ANGIOPLASTY    . CORONARY ARTERY BYPASS GRAFT  03/2006  . ENTEROSCOPY N/A 07/31/2016   Procedure: ENTEROSCOPY;  Surgeon: Ladene Artist, MD;  Location: [facility] ENDOSCOPY;  Service: Endoscopy;  Laterality: N/A;  . ESOPHAGOGASTRODUODENOSCOPY N/A 05/25/2016   No site/explanation for blood loss found.  Procedure: ESOPHAGOGASTRODUODENOSCOPY (EGD);  Surgeon: Juanita Craver, MD;  Location: [facility] ENDOSCOPY;  Service: Endoscopy;  Laterality: N/A;  . ESOPHAGOGASTRODUODENOSCOPY N/A 09/23/2016   Procedure: ESOPHAGOGASTRODUODENOSCOPY (EGD);  Surgeon: Carol Ada, MD;  Location: [facility];  Service: Endoscopy;  Laterality: N/A;  . GIVENS CAPSULE STUDY N/A 05/27/2016   No source identified.  Repeat 10/2016--results pending.  Procedure: GIVENS CAPSULE STUDY;  Surgeon: Carol Ada, MD;  Location: [facility] ENDOSCOPY;  Service:  Endoscopy;  Laterality: N/A;  . GIVENS CAPSULE STUDY N/A 10/15/2016   Procedure: GIVENS CAPSULE STUDY;  Surgeon: Carol Ada, MD;  Location: [facility] ENDOSCOPY;  Service: Endoscopy;  Laterality: N/A;  . NECK SURGERY  1991  . SMALL BOWEL ENTEROSCOPY  10/2016   ([facility]) jejunal polyps which were removed--not bleeding.  No source of bleeding was identified.  . TRANSTHORACIC ECHOCARDIOGRAM  05/25/2016   EF 60%, normal wall motion, grd II DD, mild aortic stenosis, dilated aortic root and ascending aorta.  [name] VASECTOMY  1977       Home Medications    Prior to Admission medications   Medication Sig Start Date End Date Taking? Authorizing Provider  acetaminophen (TYLENOL) 325 MG tablet Take 650 mg by mouth every 6 (six) hours as needed for fever.   Yes [provider]  cholecalciferol (VITAMIN D) 1000 units tablet Take 1,000 Units by mouth daily.   Yes [provider]  docusate sodium (COLACE) 100 MG capsule Take 100 mg by mouth 2 (two) times daily as needed for mild constipation (1 - 2 tabs as needed).   Yes [provider]  fluticasone (FLONASE) 50  MCG/ACT nasal spray Place 2 sprays into both nostrils daily. Patient taking differently: Place 2 sprays into both nostrils daily as needed for allergies.  01/01/16  Yes Withrow, Elyse Jarvis, FNP  metoprolol tartrate (LOPRESSOR) 25 MG tablet Take 0.5 tablets (12.5 mg total) by mouth 2 (two) times daily. 06/22/16  Yes McGowen, Adrian Blackwater, MD  omega-3 acid ethyl esters (LOVAZA) 1 g capsule TAKE 2 CAPSULES BY MOUTH ONCE DAILY Patient taking differently: TAKE 2 CAPSULES BY MOUTH DAILY 03/24/16  Yes Troy Sine, MD  pantoprazole (PROTONIX) 40 MG tablet Take 1 tablet (40 mg total) by mouth 2 (two) times daily before a meal. 10/11/16  Yes McGowen, Adrian Blackwater, MD  potassium chloride SA (K-DUR,KLOR-CON) 20 MEQ tablet Take 1 tablet (20 mEq total) by mouth 2 (two) times daily. 06/22/16  Yes McGowen, Adrian Blackwater, MD  spironolactone (ALDACTONE) 25 MG tablet  Take 1 tablet (25 mg total) by mouth daily. 06/22/16  Yes McGowen, Adrian Blackwater, MD  torsemide (DEMADEX) 20 MG tablet Take 2 tablets (40 mg total) by mouth daily. 06/22/16  Yes McGowen, Adrian Blackwater, MD  Evolocumab (REPATHA SURECLICK) 342 MG/ML SOAJ Inject 140 mg into the skin every 14 (fourteen) days. 10/01/16   Troy Sine, MD    Family History Family History  Problem Relation Age of Onset  . Hypertension Mother   . Cancer - Other Mother        liver cancer  . Heart disease Father   . Heart attack Father   . Cancer - Lung Father   . Diabetes Father   . Liver disease Sister   . Anemia Sister   . Heart disease Sister   . Heart attack Sister   . Heart disease Brother        CABG 20 YEARS AGO  . Hypertension Brother   . Mesothelioma Brother        HALF-BROTHER  . Cancer - Other Brother        CLL  . Cancer - Lung Brother        Mets to brain  . Cancer - Lung Sister        HALF-SISTER  . Liver disease Brother   . Heart disease Brother        CABG-2012  . Emphysema Maternal Grandfather   . Cancer - Other Paternal Grandmother        Stomach  . Heart attack Paternal Grandfather     Social History Social History  Substance Use Topics  . Smoking status: Former Smoker    Types: Cigarettes  . Smokeless tobacco: Never Used     Comment: QUIT I 1983  . Alcohol use No     Allergies   Statins   Review of Systems Review of Systems  Constitutional: Positive for chills and fever.  HENT: Negative for rhinorrhea and sore throat.   Eyes: Negative for visual disturbance.  Respiratory: Positive for cough and shortness of breath. Negative for chest tightness.   Cardiovascular: Negative for chest pain, palpitations and leg swelling.  Gastrointestinal: Positive for abdominal pain, diarrhea, nausea and vomiting. Negative for constipation.  Genitourinary: Negative for dysuria, frequency and urgency.  Musculoskeletal: Negative for back pain and myalgias.  Skin: Negative for rash.    Neurological: Positive for dizziness and light-headedness. Negative for syncope and headaches.     Physical Exam Updated Vital Signs BP (!) 121/58   Pulse (!) 109   Temp 99.1 F (37.3 C) (Oral)   Resp 17  Ht 5' 9"  (1.753 m)   Wt 110.2 kg (243 lb)   SpO2 98%   BMI 35.88 kg/m   Physical Exam  Constitutional: He appears well-developed and well-nourished. No distress.  HENT:  Head: Normocephalic and atraumatic.  Mouth/Throat: Oropharynx is clear and moist.  Eyes: Pupils are equal, round, and reactive to light. Conjunctivae and EOM are normal.  Neck: Normal range of motion. Neck supple.  Cardiovascular: Normal rate, regular rhythm, S1 normal, S2 normal and intact distal pulses.   Murmur heard. Early systolic murmur heard.   Pulmonary/Chest: Effort normal and breath sounds normal. He has no wheezes. He has no rales.  Abdominal: Soft. Bowel sounds are normal. He exhibits no distension. There is tenderness.  Abdomen tympanitic to percussion in the epigastrium and upper abdomen. Tenderness to deep palpation in the epigastrium. Patient mildly guarding in the epigastrium. No tenderness in the lower abdomen.  Musculoskeletal: Normal range of motion. He exhibits no edema or deformity.  Lymphadenopathy:    He has no cervical adenopathy.  Neurological: He is alert.  Cranial nerves grossly intact. Patient moves extremities with good coordination and without difficulty.  Skin: Skin is warm and dry. No rash noted. No erythema.  Psychiatric: He has a normal mood and affect. His behavior is normal. Judgment and thought content normal.  Nursing note and vitals reviewed.    ED Treatments / Results  Labs (all labs ordered are listed, but only abnormal results are displayed) Labs Reviewed  COMPREHENSIVE METABOLIC PANEL - Abnormal; Notable for the following:       Result Value   Glucose, Bld 129 (*)    Creatinine, Ser 1.51 (*)    Albumin 2.9 (*)    AST 54 (*)    Total Bilirubin 5.5 (*)     GFR calc non Af Amer 46 (*)    GFR calc Af Amer 53 (*)    All other components within normal limits  CBC - Abnormal; Notable for the following:    WBC 22.8 (*)    RBC 3.09 (*)    Hemoglobin 8.9 (*)    HCT 29.0 (*)    RDW 25.1 (*)    All other components within normal limits  URINALYSIS, ROUTINE W REFLEX MICROSCOPIC - Abnormal; Notable for the following:    Color, Urine AMBER (*)    All other components within normal limits  LIPASE, BLOOD  I-STAT TROPONIN, ED  I-STAT TROPONIN, ED    EKG  EKG Interpretation  Date/Time:  Tuesday November 01 2016 08:52:11 EDT Ventricular Rate:  104 PR Interval:  178 QRS Duration: 102 QT Interval:  392 QTC Calculation: 515 R Axis:   4 Text Interpretation:  Sinus tachycardia Incomplete right bundle branch block Septal infarct , age undetermined Abnormal ECG ? st depressed I, aVL Otherwise no significant change Confirmed by Deno Etienne [phone]) on 11/01/2016 2:26:18 PM       Radiology Dg Chest 2 View  Result Date: 11/01/2016 CLINICAL DATA:  Chest pain and vomiting EXAM: CHEST  2 VIEW COMPARISON:  October 14, 2016 FINDINGS: There is interstitial thickening in the lower lung zones, slightly more on the left than on the right, stable. There is no frank edema or consolidation. Heart size and pulmonary vascularity are normal. No adenopathy. There is aortic atherosclerosis. Patient is status post coronary artery bypass grafting. No focal bone lesions evident. There is degenerative change in thoracic spine. IMPRESSION: Interstitial prominence without frank edema or consolidation. No new opacity. Stable cardiac silhouette. There is  aortic atherosclerosis. Aortic Atherosclerosis (ICD10-I70.0). Electronically Signed   By: Lowella Grip III M.D.   On: 11/01/2016 09:24    Procedures Procedures (including critical care time)  Medications Ordered in ED Medications  sodium chloride 0.9 % bolus 500 mL (500 mLs Intravenous New Bag/Given 11/01/16 1458)   morphine 4 MG/ML injection 4 mg (4 mg Intravenous Given 11/01/16 1458)     Initial Impression / Assessment and Plan / ED Course  I have reviewed the triage vital signs and the nursing notes.  Pertinent labs & imaging results that were available during my care of the patient were reviewed by me and considered in my medical decision making (see chart for details).  Clinical Course as of Nov 03 38  Tue Nov 01, 2016  1425 Patient seen and evaluated. Case discussed with Dr. Deno Etienne. Proceeded with additional order and CT to assess acute abdomen. Patient is not having ischemic symptoms at this time despite Hgb 8.9 but does show some EKG changes. Will repeat.  [AM]  0160 Patient reevaluated. Patient responded well to by mouth challenge. Patient ambulatory in the department and feels well.  [AM]    Clinical Course User Index [AM] Albesa Seen, PA-C   Final Clinical Impressions(s) / ED Diagnoses   Final diagnoses:  None   MDM  Patient is afebrile, nontoxic appearing, in no apparent distress.  Patient's pain and other symptoms adequately managed in emergency department. Fluid bolus of 500 cc given. Patient responded well. Labs, imaging and vitals reviewed. Tachycardia noted during emergency department course despite fluid repletion. Discussed discharge management with this finding in consultation with Dr. Sherwood Gambler. Tachycardia likely a due to volume repletion and anemia. Additionally, patient missed all medication doses today, including BP meds and beta blockade. Patient tolerated PO liquids and solids, and feels well to complete further volume repletion PO outpatient. Patient ambulated without lightheadedness or ischemic symptoms. Leukocytosis of 22.8 noted today, compared with 5.6 on 10/28/16. This is likley due to possible viral gastroenteritis and physical stress of illness. Troponin negative x 2. EKG demonstrated nonspecific ST-T wave changes in lateral leads and no dynamic changes  on repeat. On repeat exam patient does not have a surgical abdomen and there are no peritoneal signs. No indication of appendicitis, bowel obstruction, bowel perforation, cholecystitis, diverticulitis, or acute mesenteric ischemia. CT scan of abdomen pelvis demonstrates no acute abnormalities and stable AAA of 3.6 x 3.2 cm. Patient discharged home with a brief course of Zofran for symptomatic treatment and given strict instructions for follow-up with their primary care physician later this week.  I have also discussed reasons to return immediately to the ED, including inability to keep down anything by mouth, chest pain, worsening shortness of breath, or dizziness or lightheadedness.  Patient expresses understanding and agrees with plan of care.  Patient Hgb stable at 8.9 today, which is largely unchanged from 9.1 four days ago. Patient has a history of ischemic HD with bypass, but is denying any ischemic symptoms at this time.   This is a shared visit with Dr. Deno Etienne. Patient was independently evaluated by this attending physician. Attending physician consulted in evaluation and discharge management.  This is a supervised visit with Dr. Sherwood Gambler. Evaluation, management, and discharge planning discussed with this attending physician after physician change of shift.   New Prescriptions New Prescriptions   No medications on file      Tamala Julian 11/02/16 0109    Deno Etienne, DO 11/02/16 1093

## 2016-11-01 NOTE — ED Notes (Signed)
Patient transported to CT 

## 2016-11-03 ENCOUNTER — Encounter: Payer: Self-pay | Admitting: Family Medicine

## 2016-11-03 ENCOUNTER — Ambulatory Visit (INDEPENDENT_AMBULATORY_CARE_PROVIDER_SITE_OTHER): Payer: 59 | Admitting: Family Medicine

## 2016-11-03 VITALS — BP 118/64 | HR 84 | Temp 98.5°F | Resp 16 | Ht 68.0 in | Wt 244.8 lb

## 2016-11-03 DIAGNOSIS — I2510 Atherosclerotic heart disease of native coronary artery without angina pectoris: Secondary | ICD-10-CM

## 2016-11-03 DIAGNOSIS — Z23 Encounter for immunization: Secondary | ICD-10-CM

## 2016-11-03 NOTE — Progress Notes (Signed)
OFFICE VISIT  11/03/2016   CC:  Chief Complaint  Patient presents with  . Follow-up    ER visit   HPI:    Patient is a 66 y.o. Caucasian male who presents for emergency department follow up.   On 9.25/18 pt went to ED for epigastric pain/bloating in the context of loose stools and n/v. Sx's seemed to begin on 9/21 after getting an iron infusion.  He apparently gets some epig discomfort with every iron infusion but it was never as bad and prolonged.  WBC in ED was 22K compared to about 5K at most recent check. Hb was stable at 8.9.  No more n/v since he was in ED.  Abdominal pain has improved to the point of almost none--just a bit of mid abd soreness. NO more diarrhea, no black stools since ED visit. He is due for another iron transfusion in 8d.  No dizziness in the last 24.  CT abd/pelv with contrast 11/01/16: IMPRESSION: Cirrhotic liver with splenomegaly.  Small amount of pelvic ascites with scattered soft tissue edema without identifiable cause, consider hypoproteinemia and portal hypertension.  Sigmoid diverticulosis without evidence diverticulitis.  Cholelithiasis.  Abdominal aortic aneurysm 3.6 x 3.2 cm, recommendation below.  Recommend followup by ultrasound in 2 years. This recommendation follows ACR consensus guidelines: White Paper of the ACR Incidental Findings Committee II on Vascular Findings. J Am Coll Radiol 2013; 10:789-794.  BP's lately at home 500-938 systolic over 18-29 avg.   CBG avg 130.  Past Medical History:  Diagnosis Date  . AAA (abdominal aortic aneurysm) ([place]) 03/2014   3.2 cm on MR abd.  F/u aortic u/s 06/2014 showed 3.0 x 3.1 cm AAA: recheck 2 yrs recommended (followed by cardiologist).  Minimal growth on 10/2016 CT abd done for epig pain.  Repeat u/s 10/2018.  [name] Anemia   . Bilateral renal cysts    simple (03/2014 MRI)  . CAD (coronary artery disease)   . Cholelithiases 2018   asymptomatic  . Chronic diastolic heart failure ([place])   .  Cirrhosis ([place]) 05/2016   secondary to NASH; most recent u/s abd 07/2016 showed splenomegaly.  Hx of portal HTN changes with mild ascites and splenomegaly.  . Diabetes mellitus with complication ([place]) 93/7169   A1c 6.8%  . History of blood transfusion 2018 X 4 dates   "low blood" (09/21/2016)  . Hyperlipemia, mixed    elevated LFTs when on statins.    . Hypertension    Cr bump 04/01/16 so I changed benicar-hct to benicar plain and added amlodipine 5 mg.  . Iron deficiency anemia 05/2016   Acute blood loss anemia: hospitalized, required transfusion x 3 U: colonoscopy and capsule study unrevealing.  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  W/u for ? hemolytic anemia to be pursued by hematologist as outpt.  Appt with Dr. Cristi Loron at [facility] 09/13/16.  . Microscopic hematuria    Eval unremarkable by Dr. Eulogio Ditch.  [name] NASH (nonalcoholic steatohepatitis)    Fatty liver on MR abd 03/2014, + hx of elevated transaminases and bili: followed by Dr. [name].  . Obesity   . Spleen enlarged     Past Surgical History:  Procedure Laterality Date  . CARDIAC CATHETERIZATION    . CARDIOVASCULAR STRESS TEST  01/17/2012; 05/04/16   Normal stress nuclear study x 2 (2018--Normal perfusion. LVEF 66% with normal wall motion. This is a low risk study).  [name]  CERVICAL DISCECTOMY  1992  . COLONOSCOPY N/A 05/27/2016   No site/explanation for blood loss found.  Erythematous mucosa in cecum and ascending colon--Cecal bx: normal.  Procedure: COLONOSCOPY;  Surgeon: Carol Ada, MD;  Location: [facility] ENDOSCOPY;  Service: Endoscopy;  Laterality: N/A;  . COLONOSCOPY W/ POLYPECTOMY  09/10/2014   Polypectomy x 2: recall 5 yrs (Dr. [name]).  . CORONARY ANGIOPLASTY    . CORONARY ARTERY BYPASS GRAFT  03/2006  . ENTEROSCOPY N/A 07/31/2016   Procedure: ENTEROSCOPY;  Surgeon: Ladene Artist, MD;  Location: [facility] ENDOSCOPY;  Service: Endoscopy;  Laterality:  N/A;  . ESOPHAGOGASTRODUODENOSCOPY N/A 05/25/2016   No site/explanation for blood loss found.  Procedure: ESOPHAGOGASTRODUODENOSCOPY (EGD);  Surgeon: Juanita Craver, MD;  Location: [facility] ENDOSCOPY;  Service: Endoscopy;  Laterality: N/A;  . ESOPHAGOGASTRODUODENOSCOPY N/A 09/23/2016   Procedure: ESOPHAGOGASTRODUODENOSCOPY (EGD);  Surgeon: Carol Ada, MD;  Location: [place];  Service: Endoscopy;  Laterality: N/A;  . GIVENS CAPSULE STUDY N/A 05/27/2016   No source identified.  Repeat 10/2016--results pending.  Procedure: GIVENS CAPSULE STUDY;  Surgeon: Carol Ada, MD;  Location: [facility] ENDOSCOPY;  Service: Endoscopy;  Laterality: N/A;  . GIVENS CAPSULE STUDY N/A 10/15/2016   Procedure: GIVENS CAPSULE STUDY;  Surgeon: Carol Ada, MD;  Location: [facility] ENDOSCOPY;  Service: Endoscopy;  Laterality: N/A;  . NECK SURGERY  1991  . SMALL BOWEL ENTEROSCOPY  10/2016   ([facility]) jejunal polyps which were removed--not bleeding.  No source of bleeding was identified.  . TRANSTHORACIC ECHOCARDIOGRAM  05/25/2016   EF 60%, normal wall motion, grd II DD, mild aortic stenosis, dilated aortic root and ascending aorta.  [name] VASECTOMY  1977    Outpatient Medications Prior to Visit  Medication Sig Dispense Refill  . acetaminophen (TYLENOL) 325 MG tablet Take 650 mg by mouth every 6 (six) hours as needed for fever.    . cholecalciferol (VITAMIN D) 1000 units tablet Take 1,000 Units by mouth daily.    [name] docusate sodium (COLACE) 100 MG capsule Take 100 mg by mouth 2 (two) times daily as needed for mild constipation (1 - 2 tabs as needed).    . fluticasone (FLONASE) 50 MCG/ACT nasal spray Place 2 sprays into both nostrils daily. (Patient taking differently: Place 2 sprays into both nostrils daily as needed for allergies. ) 16 g 6  . metoprolol tartrate (LOPRESSOR) 25 MG tablet Take 0.5 tablets (12.5 mg total) by mouth 2 (two) times daily. 60 tablet 6  . omega-3 acid ethyl esters (LOVAZA) 1 g capsule TAKE 2 CAPSULES BY MOUTH ONCE  DAILY (Patient taking differently: TAKE 2 CAPSULES BY MOUTH DAILY) 180 capsule 3  . ondansetron (ZOFRAN) 4 MG tablet Take 1 tablet (4 mg total) by mouth every 8 (eight) hours as needed for nausea or vomiting. 10 tablet 0  . pantoprazole (PROTONIX) 40 MG tablet Take 1 tablet (40 mg total) by mouth 2 (two) times daily before a meal. 60 tablet 3  . potassium chloride SA (K-DUR,KLOR-CON) 20 MEQ tablet Take 1 tablet (20 mEq total) by mouth 2 (two) times daily. 60 tablet 6  . spironolactone (ALDACTONE) 25 MG tablet Take 1 tablet (25 mg total) by mouth daily. 30 tablet 6  . torsemide (DEMADEX) 20 MG tablet Take 2 tablets (40 mg total) by mouth daily. 60 tablet 6  . Evolocumab (REPATHA SURECLICK) 500 MG/ML SOAJ Inject 140 mg into the skin every 14 (fourteen) days. (Patient not taking: Reported on 11/03/2016) 2 pen 12   No facility-administered medications prior to visit.  Allergies  Allergen Reactions  . Statins Other (See Comments)    Liver enzymes go up whenever on them    ROS As per HPI  PE: Blood pressure 118/64, pulse 84, temperature 98.5 F (36.9 C), temperature source Oral, resp. rate 16, height 5' 8"  (1.727 m), weight 244 lb 12 oz (111 kg), SpO2 94 %. Gen: Alert, well appearing.  Patient is oriented to person, place, time, and situation. AFFECT: pleasant, lucid thought and speech. No significant pallor.  No jaundice.  Question of a trace of scleral icterus. CV: RRR 2/6 systolic murmur, S1 obscured, S2 distinct, no diastolic murmur.  No r/g. Chest is clear, no wheezing or rales. Normal symmetric air entry throughout both lung fields. No chest wall deformities or tenderness. ABD: soft, NT--just a bit of diffuse "soreness" to palpation without any guarding or rebound tenderness.  ND, BS normal.  No hepatospenomegaly or mass.  No bruits. EXT: no clubbing, cyanosis, or edema.    LABS:  Lab Results  Component Value Date   WBC 22.8 (H) 11/01/2016   HGB 8.9 (L) 11/01/2016   HCT 29.0  (L) 11/01/2016   MCV 93.9 11/01/2016   PLT 166 11/01/2016     Chemistry      Component Value Date/Time   NA 135 11/01/2016 0854   NA 133 (L) 10/14/2016 1127   K 4.3 11/01/2016 0854   K 4.1 10/14/2016 1127   CL 104 11/01/2016 0854   CO2 22 11/01/2016 0854   CO2 24 10/14/2016 1127   BUN 13 11/01/2016 0854   BUN 12.9 10/14/2016 1127   CREATININE 1.51 (H) 11/01/2016 0854   CREATININE 1.3 10/14/2016 1127      Component Value Date/Time   CALCIUM 8.9 11/01/2016 0854   CALCIUM 8.4 10/14/2016 1127   ALKPHOS 81 11/01/2016 0854   ALKPHOS 93 10/14/2016 1127   AST 54 (H) 11/01/2016 0854   AST 27 10/14/2016 1127   ALT 38 11/01/2016 0854   ALT 19 10/14/2016 1127   BILITOT 5.5 (H) 11/01/2016 0854   BILITOT 3.47 (H) 10/14/2016 1127      IMPRESSION AND PLAN:  No problem-specific Assessment & Plan notes found for this encounter.   FOLLOW UP: No Follow-up on file.

## 2016-11-07 ENCOUNTER — Encounter: Payer: Self-pay | Admitting: Family Medicine

## 2016-11-09 ENCOUNTER — Other Ambulatory Visit: Payer: Self-pay

## 2016-11-09 MED ORDER — EVOLOCUMAB 140 MG/ML ~~LOC~~ SOAJ: 140.0000 mg | SUBCUTANEOUS | 10 refills | Status: DC

## 2016-11-11 MED FILL — POTASSIUM CL ER 20 MEQ TABL: 20 | 30 days supply | Qty: 60 | Fill #4

## 2016-11-11 MED FILL — TORSEMIDE 20 MG TABLET: 20 | 30 days supply | Qty: 60 | Fill #4

## 2016-11-11 MED FILL — SPIRONOLACTONE 25 MG TABLET: 25 | 30 days supply | Qty: 30 | Fill #4

## 2016-11-14 ENCOUNTER — Other Ambulatory Visit: Payer: Self-pay

## 2016-11-14 DIAGNOSIS — K922 Gastrointestinal hemorrhage, unspecified: Secondary | ICD-10-CM

## 2016-11-15 NOTE — Progress Notes (Signed)
HEMATOLOGY/ONCOLOGY CLINIC NOTE  Date of Service: 11/15/16  Patient Care Team: Tammi Sou, MD as PCP - General (Family Medicine) Juanita Craver, MD as Consulting Physician (Gastroenterology) Franchot Gallo, MD as Consulting Physician (Urology) Troy Sine, MD as Consulting Physician (Cardiology) Brunetta Genera, MD as Consulting Physician (Hematology)  CHIEF COMPLAINTS/PURPOSE OF CONSULTATION:  Chronic blood loss Anemia from GI bleeding.  HISTORY OF PRESENTING ILLNESS:   William [name] is a wonderful 66 y.o. male who has been referred to [place] by Dr .Anitra Lauth, Adrian Blackwater, MD for evaluation and management of Anemia.  Patient has a history of extensive medical comorbidities including liver cirrhosis related to [facility] with portal hypertension, esophageal varices and portal hypertensive gastropathy and splenomegaly, iron deficiency anemia, obesity, diabetes, AAA.  Patient was admitted in April 2018 with acute blood loss anemia and required transfusion of multiple units of PRBCs with iron profile suggestive of iron deficiency. No overt evidence of hemolysis noted. He had an EGD and colonoscopy that did not show any overt bleeding. Capsule endoscopy was unrevealing but the capsule past and only 27 minutes.  He follows with Dr. [name] who is his gastric oncologist.  Patient was again readmitted in June 2018 with symptomatically anemia and hemoglobin of 6.3. He underwent 4 units of PRBC transfusions again and was sent out on PPI and iron supplementation with the plan to follow-up with Dr. [name].  He was also given hematology referral. He had a repeat ultrasound of the abdomen on 07/29/2016 which showed significant increase in splenomegaly in 2 months suggesting significant portal hypertension versus some element of splenic sequestration.  He continues to note intermittent black stools.  Hemoglobin is stable and improved today and is up to 11.1. Patient notes he feels better  after his transfusions. Has not noted lower GI bleeding at this time. We discuss any other workup to rule out less likely other possibilities of his anemia.  INTERVAL HISTORY  Patient is here for f/u of his anemia that is primarily related to ongoing issues with GI bleeding. hgb level being 7.5 as of today 11/16/2016. Pt presents to the office today accompanied by his wife. He reports that he is doing well overall. He states that he has had bloody stools which has resolved a few days ago as well as a fever blister on his upper lip which his wife states bled excessively. He reports that he is feeling well enough to mow his and his daughter's lawn. He takes 2 pills per night of Omega-3 acid and is not taking ASA. His wife states that pt has been running an intermittent fever every couple of weeks. He states that this is typically resolved after taking tylenol. He has been following with his GI doctor as well as with [facility] and notes that there were no other additional recommendation to stop his GI bleeding. We discussed in details and he is agreeable to try an atnifibrinolytic to try to control with chronic GI bleeding. He is agreeable to get an additional PRBC transfusion.  On review of systems, pt reports nausea, intermittent fever,  Mild SOB, cough, dizziness, bloody stools, and denies dysuria, gum bleeding, abdominal pain, and any other acute accompanying symptoms.     MEDICAL HISTORY:  Past Medical History:  Diagnosis Date  . AAA (abdominal aortic aneurysm) ([place]) 03/2014   3.2 cm on MR abd.  F/u aortic u/s 06/2014 showed 3.0 x 3.1 cm AAA: recheck 2 yrs recommended (followed by cardiologist).  Minimal  growth on 10/2016 CT abd done for epig pain.  Repeat u/s 10/2018.  [name] Anemia   . Bilateral renal cysts    simple (03/2014 MRI)  . CAD (coronary artery disease)   . Cholelithiases 2018   asymptomatic  . Chronic diastolic heart failure ([place])   . Cirrhosis ([place]) 05/2016   secondary to NASH; most  recent u/s abd 07/2016 showed splenomegaly.  Hx of portal HTN changes with mild ascites and splenomegaly.  . Diabetes mellitus with complication ([place]) 42/8768   A1c 6.8%  . History of blood transfusion 2018 X 4 dates   "low blood" (09/21/2016)  . Hyperlipemia, mixed    elevated LFTs when on statins.    . Hypertension    Cr bump 04/01/16 so I changed benicar-hct to benicar plain and added amlodipine 5 mg.  . Iron deficiency anemia 05/2016   Acute blood loss anemia: hospitalized, required transfusion x 3 U: colonoscopy and capsule study unrevealing.  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  W/u for ? hemolytic anemia to be pursued by hematologist as outpt.  Appt with [facility] Cristi Loron at [facility] 09/13/16.  . Microscopic hematuria    Eval unremarkable by Dr. Eulogio Ditch.  [name] NASH (nonalcoholic steatohepatitis)    Fatty liver on MR abd 03/2014, + hx of elevated transaminases and bili: followed by Dr. [name].  . Obesity   . Spleen enlarged     SURGICAL HISTORY: Past Surgical History:  Procedure Laterality Date  . CARDIAC CATHETERIZATION    . CARDIOVASCULAR STRESS TEST  01/17/2012; 05/04/16   Normal stress nuclear study x 2 (2018--Normal perfusion. LVEF 66% with normal wall motion. This is a low risk study).  . CERVICAL DISCECTOMY  1992  . COLONOSCOPY N/A 05/27/2016   No site/explanation for blood loss found.  Erythematous mucosa in cecum and ascending colon--Cecal bx: normal.  Procedure: COLONOSCOPY;  Surgeon: Carol Ada, MD;  Location: [facility] ENDOSCOPY;  Service: Endoscopy;  Laterality: N/A;  . COLONOSCOPY W/ POLYPECTOMY  09/10/2014   Polypectomy x 2: recall 5 yrs (Dr. [name]).  . CORONARY ANGIOPLASTY    . CORONARY ARTERY BYPASS GRAFT  03/2006  . ENTEROSCOPY N/A 07/31/2016   Procedure: ENTEROSCOPY;  Surgeon: Ladene Artist, MD;  Location: [facility] ENDOSCOPY;  Service: Endoscopy;  Laterality: N/A;  .  ESOPHAGOGASTRODUODENOSCOPY N/A 05/25/2016   No site/explanation for blood loss found.  Procedure: ESOPHAGOGASTRODUODENOSCOPY (EGD);  Surgeon: Juanita Craver, MD;  Location: [facility] ENDOSCOPY;  Service: Endoscopy;  Laterality: N/A;  . ESOPHAGOGASTRODUODENOSCOPY N/A 09/23/2016   Procedure: ESOPHAGOGASTRODUODENOSCOPY (EGD);  Surgeon: Carol Ada, MD;  Location: [place];  Service: Endoscopy;  Laterality: N/A;  . GIVENS CAPSULE STUDY N/A 05/27/2016   No source identified.  Repeat 10/2016--results pending.  Procedure: GIVENS CAPSULE STUDY;  Surgeon: Carol Ada, MD;  Location: [facility] ENDOSCOPY;  Service: Endoscopy;  Laterality: N/A;  . GIVENS CAPSULE STUDY N/A 10/15/2016   Procedure: GIVENS CAPSULE STUDY;  Surgeon: Carol Ada, MD;  Location: [facility] ENDOSCOPY;  Service: Endoscopy;  Laterality: N/A;  . NECK SURGERY  1991  . SMALL BOWEL ENTEROSCOPY  10/2016   ([facility], Dr. [name]: Double balloon enteroscopy--to the level of proximal ileum) jejunal polyps- which were removed--not bleeding & benign.  No source of bleeding was identified.  . TRANSTHORACIC ECHOCARDIOGRAM  05/25/2016   EF 60%, normal wall motion, grd II DD, mild aortic stenosis, dilated aortic root and ascending aorta.  [name] Diaz  SOCIAL HISTORY: Social History   Social History  . Marital status: Married    Spouse name: N/A  . Number of children: N/A  . Years of education: N/A   Occupational History  . Not on file.   Social History Main Topics  . Smoking status: Former Smoker    Types: Cigarettes  . Smokeless tobacco: Never Used     Comment: QUIT I 1983  . Alcohol use No  . Drug use: No  . Sexual activity: Yes   Other Topics Concern  . Not on file   Social History Narrative   Married, 3 grown children, 3 GCs.   Educ: 10th grade.   Occupation: Retired Brewing technologist.   Tob: quit 1983, smoked about 20 pack-yr hx prior.   No alcohol.    FAMILY HISTORY: Family History  Problem Relation Age of Onset  . Hypertension Mother     . Cancer - Other Mother        liver cancer  . Heart disease Father   . Heart attack Father   . Cancer - Lung Father   . Diabetes Father   . Liver disease Sister   . Anemia Sister   . Heart disease Sister   . Heart attack Sister   . Heart disease Brother        CABG 20 YEARS AGO  . Hypertension Brother   . Mesothelioma Brother        HALF-BROTHER  . Cancer - Other Brother        CLL  . Cancer - Lung Brother        Mets to brain  . Cancer - Lung Sister        HALF-SISTER  . Liver disease Brother   . Heart disease Brother        CABG-2012  . Emphysema Maternal Grandfather   . Cancer - Other Paternal Grandmother        Stomach  . Heart attack Paternal Grandfather     ALLERGIES:  is allergic to statins.  MEDICATIONS:  Current Outpatient Prescriptions  Medication Sig Dispense Refill  . acetaminophen (TYLENOL) 325 MG tablet Take 650 mg by mouth every 6 (six) hours as needed for fever.    . cholecalciferol (VITAMIN D) 1000 units tablet Take 1,000 Units by mouth daily.    [name] docusate sodium (COLACE) 100 MG capsule Take 100 mg by mouth 2 (two) times daily as needed for mild constipation (1 - 2 tabs as needed).    . fluticasone (FLONASE) 50 MCG/ACT nasal spray Place 2 sprays into both nostrils daily. (Patient taking differently: Place 2 sprays into both nostrils daily as needed for allergies. ) 16 g 6  . metoprolol tartrate (LOPRESSOR) 25 MG tablet Take 0.5 tablets (12.5 mg total) by mouth 2 (two) times daily. 60 tablet 6  . omega-3 acid ethyl esters (LOVAZA) 1 g capsule TAKE 2 CAPSULES BY MOUTH ONCE DAILY (Patient taking differently: TAKE 2 CAPSULES BY MOUTH DAILY) 180 capsule 3  . ondansetron (ZOFRAN) 4 MG tablet Take 1 tablet (4 mg total) by mouth every 8 (eight) hours as needed for nausea or vomiting. 10 tablet 0  . pantoprazole (PROTONIX) 40 MG tablet Take 1 tablet (40 mg total) by mouth 2 (two) times daily before a meal. 60 tablet 3  . potassium chloride SA (K-DUR,KLOR-CON) 20  MEQ tablet Take 1 tablet (20 mEq total) by mouth 2 (two) times daily. 60 tablet 6  . spironolactone (ALDACTONE) 25 MG tablet Take  1 tablet (25 mg total) by mouth daily. 30 tablet 6  . torsemide (DEMADEX) 20 MG tablet Take 2 tablets (40 mg total) by mouth daily. 60 tablet 6  . Evolocumab (REPATHA SURECLICK) 403 MG/ML SOAJ Inject 140 mg into the skin every 14 (fourteen) days. (Patient not taking: Reported on 11/16/2016) 2 pen 10  . rosuvastatin (CRESTOR) 5 MG tablet Take 1 tablet by mouth 1-2 times per week as tolerated 24 tablet 3  . tranexamic acid (LYSTEDA) 650 MG TABS tablet Take 1 tablet (650 mg total) by mouth 3 (three) times daily. 90 tablet 1   No current facility-administered medications for this visit.     REVIEW OF SYSTEMS:    10 Point review of Systems was done is negative except as noted above.  PHYSICAL EXAMINATION:  ECOG PERFORMANCE STATUS: 1 - Symptomatic but completely ambulatory  . Vitals:   11/16/16 1250  BP: (!) 119/42  Pulse: 83  Resp: 17  Temp: 98.6 F (37 C)  SpO2: 99%   Filed Weights   11/16/16 1250  Weight: 244 lb 3.2 oz (110.8 kg)   .Body mass index is 37.13 kg/m.  GENERAL:alert, in no acute distress and comfortable SKIN: no acute rashes, no significant lesions EYES: conjunctiva are pink and non-injected, sclera anicteric OROPHARYNX: MMM, no exudates, no oropharyngeal erythema or ulceration NECK: supple, no JVD LYMPH:  no palpable lymphadenopathy in the cervical, axillary or inguinal regions LUNGS: clear to auscultation b/l with normal respiratory effort HEART: regular rate & rhythm ABDOMEN:  normoactive bowel sounds , non tender, not distended. Extremity: Trace pedal edema bilaterally.  PSYCH: alert & oriented x 3 with fluent speech NEURO: no focal motor/sensory deficits  LABORATORY DATA:  I have reviewed the data as listed  . CBC Latest Ref Rng & Units 11/18/2016 11/16/2016 11/01/2016  WBC 4.0 - 10.3 10e3/uL 4.7 4.7 22.8(H)  Hemoglobin  13.0 - 17.1 g/dL 7.0(L) 7.5(L) 8.9(L)  Hematocrit 38.4 - 49.9 % 22.6(L) 23.4(L) 29.0(L)  Platelets 140 - 400 10e3/uL 149 156 166    . CMP Latest Ref Rng & Units 11/18/2016 11/16/2016 11/01/2016  Glucose 70 - 140 mg/dl 102 158(H) 129(H)  BUN 7.0 - 26.0 mg/dL 12.3 12.7 13  Creatinine 0.7 - 1.3 mg/dL 1.1 1.1 1.51(H)  Sodium 136 - 145 mEq/L 135(L) 137 135  Potassium 3.5 - 5.1 mEq/L 4.1 4.3 4.3  Chloride 101 - 111 mmol/L - - 104  CO2 22 - 29 mEq/L 23 24 22   Calcium 8.4 - 10.4 mg/dL 8.5 8.5 8.9  Total Protein 6.4 - 8.3 g/dL 6.3(L) 6.5 6.6  Total Bilirubin 0.20 - 1.20 mg/dL 3.66(HH) 3.04(H) 5.5(H)  Alkaline Phos 40 - 150 U/L 80 84 81  AST 5 - 34 U/L 29 27 54(H)  ALT 0 - 55 U/L 20 21 38    . Lab Results  Component Value Date   IRON 21 (L) 11/16/2016   TIBC 353 11/16/2016   IRONPCTSAT 6 (L) 11/16/2016   (Iron and TIBC)  Lab Results  Component Value Date   FERRITIN 73 11/18/2016    RADIOGRAPHIC STUDIES: I have personally reviewed the radiological images as listed and agreed with the findings in the report. Dg Chest 2 View  Result Date: 11/01/2016 CLINICAL DATA:  Chest pain and vomiting EXAM: CHEST  2 VIEW COMPARISON:  October 14, 2016 FINDINGS: There is interstitial thickening in the lower lung zones, slightly more on the left than on the right, stable. There is no frank edema or consolidation. Heart  size and pulmonary vascularity are normal. No adenopathy. There is aortic atherosclerosis. Patient is status post coronary artery bypass grafting. No focal bone lesions evident. There is degenerative change in thoracic spine. IMPRESSION: Interstitial prominence without frank edema or consolidation. No new opacity. Stable cardiac silhouette. There is aortic atherosclerosis. Aortic Atherosclerosis (ICD10-I70.0). Electronically Signed   By: Lowella Grip III M.D.   On: 11/01/2016 09:24   Ct Abdomen Pelvis W Contrast  Result Date: 11/01/2016 CLINICAL DATA:  Nausea, vomiting, and  diarrhea for 5 days following an iron infusion, abdominal distension, history cirrhosis, hypertension, diabetes mellitus EXAM: CT ABDOMEN AND PELVIS WITH CONTRAST TECHNIQUE: Multidetector CT imaging of the abdomen and pelvis was performed using the standard protocol following bolus administration of intravenous contrast. Sagittal and coronal MPR images reconstructed from axial data set. CONTRAST:  153m ISOVUE-300 IOPAMIDOL (ISOVUE-300) INJECTION 61% IV. No oral contrast. COMPARISON:  03/08/2013 FINDINGS: Lower chest: Lung bases clear Hepatobiliary: Nodular cirrhotic appearing liver with a chronic low-attenuation focus 16 x 13 mm at the anterior superior LEFT lobe image 12 previously 12 x 8 mm. Distended gallbladder with multiple dependent calcified gallstones. No definite biliary dilatation or CBD calcification. Pancreas: Grossly normal appearance Spleen: Appears mildly enlarged at 15.1 x 6.8 x 12.4 cm (volume = 670 cm^3). No focal lesion. Adrenals/Urinary Tract: Adrenal glands unremarkable. BILATERAL renal cysts unchanged. Tiny nonobstructing calculus LEFT kidney image 42. No additional renal mass, hydronephrosis or ureteral dilatation. Bladder unremarkable. Stomach/Bowel: Few scattered sigmoid diverticula without evidence of diverticulitis. Remainder of colon unremarkable. Appendix unremarkable, extending cranially lateral to the RIGHT lobe of the liver. Stomach decompressed with suboptimal assessment of gastric wall thickness. No definite small bowel dilatation or wall thickening on exam lacking GI contrast. Vascular/Lymphatic: Atherosclerotic calcifications aorta. Aneurysmal dilatation of the mid abdominal aorta 3.6 x 3.2 cm. Coronary arterial calcifications noted. Few normal sized retroperitoneal nodes without abdominal or pelvic adenopathy. Reproductive: Unremarkable prostate gland and seminal vesicles Other: Small amount of free pelvic fluid. Scattered edema within mesenteries and retroperitoneum. No free  air. No hernia. Musculoskeletal: Scattered degenerative changes of the thoracolumbar spine. IMPRESSION: Cirrhotic liver with splenomegaly. Small amount of pelvic ascites with scattered soft tissue edema without identifiable cause, consider hypoproteinemia and portal hypertension. Sigmoid diverticulosis without evidence diverticulitis. Cholelithiasis. Abdominal aortic aneurysm 3.6 x 3.2 cm, recommendation below. Recommend followup by ultrasound in 2 years. This recommendation follows ACR consensus guidelines: White Paper of the ACR Incidental Findings Committee II on Vascular Findings. J Am Coll Radiol 2013; 10:789-794. Aortic Atherosclerosis (ICD10-I70.0). Aortic aneurysm NOS (ICD10-I71.9). Electronically Signed   By: MLavonia DanaM.D.   On: 11/01/2016 16:39    ASSESSMENT & PLAN:   66year old male with multiple medical comorbidities with NKarlene Linemanwith liver cirrhosis with portal hypertension, esophageal varices and portal hypertensive gastropathy with  #1 Acute on Chronic blood loss anemia.  Patient has had recurrent GI bleeding requiring nearly 20 units of PRBCs. Between April 2018 and Aug 2018 and continues to have ongoing GI bleeding. Previous workup showed LDH within normal limits at 220 and suggests against overt hemolysis. Sedimentation rate within normal limits. Coombs' test is negative. Myeloma panel and serum free light chains suggest no monoclonal paraproteinemia. PNH testing negative Slightly lower haptoglobin levels can be related to decreased hepatic production, low-level hemolysis due to transfusions or extravascular hemolysis due to splenic or hepatic sequestration.  PLAN -we discussed that he continues to have ongoing GI bleeding  -he has had extensive GI evaluation with no source evidence of controllable at  this time. -discussed with the pt and his wife option of starting Lysteda as well as the risks versus benefits to which pt elected to try it. - -PRBC transfusion 2 units in 1-2  days for symptomatic anemia -schedule for IV Injectafer q4 weeks x 4 doses -Labs q2 weeks  -will start pt on lysteda 649m po three times daily   Additional labs today (type and cross) PRBC transfusion 2 units in 1-2 days for symptomatic anemia IV Injectafer q4weeks x 4 doses Labs q2weeks RTC with Dr KIrene Limboin 4 weeks   All of the patients questions were answered with apparent satisfaction. The patient knows to call the clinic with any problems, questions or concerns.  I spent 20 minutes counseling the patient face to face. The total time spent in the appointment was 30 minutes and more than 50% was on counseling and direct patient cares and co-ordination and arranging his transfer to the ED    GSullivan LoneMD MFentonAAHIVMS SAcadia General HospitalCEye Physicians Of Sussex CountyHematology/Oncology Physician [place] (Office):       [phone](Work cell):  [phone](Fax):           [phone] This document serves as a record of services personally performed by GSullivan Lone MD. It was created on her behalf by LAlean Rinne a trained medical scribe. The creation of this record is based on the scribe's personal observations and the provider's statements to them. This document has been checked and approved by the attending provider.

## 2016-11-16 ENCOUNTER — Other Ambulatory Visit (HOSPITAL_BASED_OUTPATIENT_CLINIC_OR_DEPARTMENT_OTHER): Payer: 59

## 2016-11-16 ENCOUNTER — Encounter: Payer: Self-pay | Admitting: Cardiovascular Disease

## 2016-11-16 ENCOUNTER — Other Ambulatory Visit: Payer: Self-pay

## 2016-11-16 ENCOUNTER — Ambulatory Visit (HOSPITAL_COMMUNITY)
Admission: RE | Admit: 2016-11-16 | Discharge: 2016-11-16 | Disposition: A | Discharge status: Home / Self Care | Payer: 59 | Source: Ambulatory Visit | Attending: Hematology | Admitting: Hematology

## 2016-11-16 ENCOUNTER — Encounter: Payer: Self-pay | Admitting: Hematology

## 2016-11-16 ENCOUNTER — Ambulatory Visit (HOSPITAL_BASED_OUTPATIENT_CLINIC_OR_DEPARTMENT_OTHER): Payer: 59 | Admitting: Hematology

## 2016-11-16 ENCOUNTER — Telehealth: Payer: Self-pay | Admitting: Hematology

## 2016-11-16 VITALS — BP 119/42 | HR 83 | Temp 98.6°F | Resp 17 | Ht 68.0 in | Wt 244.2 lb

## 2016-11-16 DIAGNOSIS — D689 Coagulation defect, unspecified: Secondary | ICD-10-CM

## 2016-11-16 DIAGNOSIS — K922 Gastrointestinal hemorrhage, unspecified: Secondary | ICD-10-CM

## 2016-11-16 DIAGNOSIS — D62 Acute posthemorrhagic anemia: Secondary | ICD-10-CM

## 2016-11-16 DIAGNOSIS — D649 Anemia, unspecified: Secondary | ICD-10-CM | POA: Insufficient documentation

## 2016-11-16 DIAGNOSIS — D500 Iron deficiency anemia secondary to blood loss (chronic): Secondary | ICD-10-CM

## 2016-11-16 DIAGNOSIS — K921 Melena: Secondary | ICD-10-CM

## 2016-11-16 LAB — COMPREHENSIVE METABOLIC PANEL
ALBUMIN: 2.5 g/dL — AB (ref 3.5–5.0)
ALT: 21 U/L (ref 0–55)
AST: 27 U/L (ref 5–34)
Alkaline Phosphatase: 84 U/L (ref 40–150)
Anion Gap: 7 mEq/L (ref 3–11)
BUN: 12.7 mg/dL (ref 7.0–26.0)
CALCIUM: 8.5 mg/dL (ref 8.4–10.4)
CHLORIDE: 107 meq/L (ref 98–109)
CO2: 24 mEq/L (ref 22–29)
CREATININE: 1.1 mg/dL (ref 0.7–1.3)
EGFR: >60 mL/min/{1.73_m2} (ref >60)
Glucose: 158 mg/dl — ABNORMAL HIGH (ref 70–140)
Potassium: 4.3 mEq/L (ref 3.5–5.1)
Sodium: 137 mEq/L (ref 136–145)
Total Bilirubin: 3.04 mg/dL — ABNORMAL HIGH (ref 0.20–1.20)
Total Protein: 6.5 g/dL (ref 6.4–8.3)

## 2016-11-16 LAB — CBC WITH DIFFERENTIAL/PLATELET
BASO%: 1.2 % (ref 0.0–2.0)
Basophils Absolute: 0.1 10*3/uL (ref 0.0–0.1)
EOS%: 7.4 % — AB (ref 0.0–7.0)
Eosinophils Absolute: 0.3 10*3/uL (ref 0.0–0.5)
HEMATOCRIT: 23.4 % — AB (ref 38.4–49.9)
HEMOGLOBIN: 7.5 g/dL — AB (ref 13.0–17.1)
LYMPH#: 0.7 10*3/uL — AB (ref 0.9–3.3)
LYMPH%: 15.1 % (ref 14.0–49.0)
MCH: 29 pg (ref 27.2–33.4)
MCHC: 31.9 g/dL — ABNORMAL LOW (ref 32.0–36.0)
MCV: 90.8 fL (ref 79.3–98.0)
MONO#: 0.7 10*3/uL (ref 0.1–0.9)
MONO%: 15.5 % — ABNORMAL HIGH (ref 0.0–14.0)
NEUT%: 60.8 % (ref 39.0–75.0)
NEUTROS ABS: 2.8 10*3/uL (ref 1.5–6.5)
Platelets: 156 10*3/uL (ref 140–400)
RBC: 2.57 10*6/uL — ABNORMAL LOW (ref 4.20–5.82)
RDW: 22.4 % — AB (ref 11.0–14.6)
WBC: 4.7 10*3/uL (ref 4.0–10.3)

## 2016-11-16 MED ORDER — TRANEXAMIC ACID 650 MG PO TABS: 650.0000 mg | ORAL_TABLET | Freq: Three times a day (TID) | ORAL | 1 refills | Status: DC

## 2016-11-16 MED FILL — TRANEXAMIC ACID 650 MG TAB: 650 | 30 days supply | Qty: 90 | Fill #0

## 2016-11-16 NOTE — Telephone Encounter (Signed)
Scheduled appt per 10/10 los - Gave patient AVS and calender per los.  

## 2016-11-17 ENCOUNTER — Telehealth: Payer: Self-pay | Admitting: Pharmacist Clinician (PhC)/ Clinical Pharmacy Specialist

## 2016-11-17 LAB — FERRITIN: Ferritin: 82 ng/ml (ref 22–316)

## 2016-11-17 LAB — IRON AND TIBC
%SAT: 6 % — ABNORMAL LOW (ref 20–55)
IRON: 21 ug/dL — AB (ref 42–163)
TIBC: 353 ug/dL (ref 202–409)
UIBC: 332 ug/dL (ref 117–376)

## 2016-11-17 MED ORDER — ROSUVASTATIN CALCIUM 5 MG PO TABS: ORAL_TABLET | ORAL | 3 refills | Status: DC

## 2016-11-17 MED FILL — ROSUVASTATIN CALCIUM 5 MG T: 5 | 84 days supply | Qty: 24 | Fill #0

## 2016-11-17 NOTE — Telephone Encounter (Signed)
Spoke with patient.  Request for PCSK-9i was denied by insurance because of lack of absolute contraindication to statin (hypersensitivity, nursing/pregnant or active decompensated liver disease) or failure to have LFT at > 3 x UNL or CK  > 10 x UNL.     Patient LDL at 115.  Will have him re-challenge with rosuvastatin 5 mg once weekly for 8 weeks.  Sees TK on Dec 6.  Will repeat lipid/liver labs at that time to determine course of action.  Patient agreeable to plan.

## 2016-11-18 ENCOUNTER — Other Ambulatory Visit: Payer: Self-pay

## 2016-11-18 ENCOUNTER — Other Ambulatory Visit (HOSPITAL_BASED_OUTPATIENT_CLINIC_OR_DEPARTMENT_OTHER): Payer: 59

## 2016-11-18 ENCOUNTER — Telehealth: Payer: Self-pay

## 2016-11-18 DIAGNOSIS — D500 Iron deficiency anemia secondary to blood loss (chronic): Secondary | ICD-10-CM | POA: Diagnosis not present

## 2016-11-18 DIAGNOSIS — D689 Coagulation defect, unspecified: Secondary | ICD-10-CM

## 2016-11-18 DIAGNOSIS — D649 Anemia, unspecified: Secondary | ICD-10-CM

## 2016-11-18 LAB — CBC & DIFF AND RETIC
BASO%: 1.7 % (ref 0.0–2.0)
Basophils Absolute: 0.1 10*3/uL (ref 0.0–0.1)
EOS%: 8.2 % — AB (ref 0.0–7.0)
Eosinophils Absolute: 0.4 10*3/uL (ref 0.0–0.5)
HCT: 22.6 % — ABNORMAL LOW (ref 38.4–49.9)
HGB: 7 g/dL — ABNORMAL LOW (ref 13.0–17.1)
Immature Retic Fract: 14.1 % — ABNORMAL HIGH (ref 3.00–10.60)
LYMPH#: 0.9 10*3/uL (ref 0.9–3.3)
LYMPH%: 18.6 % (ref 14.0–49.0)
MCH: 28.1 pg (ref 27.2–33.4)
MCHC: 31 g/dL — AB (ref 32.0–36.0)
MCV: 90.8 fL (ref 79.3–98.0)
MONO#: 1 10*3/uL — ABNORMAL HIGH (ref 0.1–0.9)
MONO%: 20.3 % — ABNORMAL HIGH (ref 0.0–14.0)
NEUT%: 51.2 % (ref 39.0–75.0)
NEUTROS ABS: 2.4 10*3/uL (ref 1.5–6.5)
NRBC: 0 % (ref 0–0)
Platelets: 149 10*3/uL (ref 140–400)
RBC: 2.49 10*6/uL — AB (ref 4.20–5.82)
RDW: 20 % — AB (ref 11.0–14.6)
RETIC %: 4.14 % — AB (ref 0.80–1.80)
Retic Ct Abs: 103.09 10*3/uL — ABNORMAL HIGH (ref 34.80–93.90)
WBC: 4.7 10*3/uL (ref 4.0–10.3)

## 2016-11-18 LAB — COMPREHENSIVE METABOLIC PANEL
ALT: 20 U/L (ref 0–55)
AST: 29 U/L (ref 5–34)
Albumin: 2.5 g/dL — ABNORMAL LOW (ref 3.5–5.0)
Alkaline Phosphatase: 80 U/L (ref 40–150)
Anion Gap: 6 mEq/L (ref 3–11)
BUN: 12.3 mg/dL (ref 7.0–26.0)
CALCIUM: 8.5 mg/dL (ref 8.4–10.4)
CHLORIDE: 106 meq/L (ref 98–109)
CO2: 23 mEq/L (ref 22–29)
Creatinine: 1.1 mg/dL (ref 0.7–1.3)
EGFR: >60 mL/min/{1.73_m2} (ref >60)
GLUCOSE: 102 mg/dL (ref 70–140)
POTASSIUM: 4.1 meq/L (ref 3.5–5.1)
SODIUM: 135 meq/L — AB (ref 136–145)
Total Bilirubin: 3.66 mg/dL (ref 0.20–1.20)
Total Protein: 6.3 g/dL — ABNORMAL LOW (ref 6.4–8.3)

## 2016-11-18 LAB — FERRITIN: FERRITIN: 73 ng/mL (ref 22–316)

## 2016-11-18 NOTE — Telephone Encounter (Signed)
Wife called stating Dr Irene Limbo was going to rx zofran on 11/16/16. What strenth, how many and how many refills?

## 2016-11-19 ENCOUNTER — Ambulatory Visit (HOSPITAL_BASED_OUTPATIENT_CLINIC_OR_DEPARTMENT_OTHER): Payer: 59

## 2016-11-19 DIAGNOSIS — D500 Iron deficiency anemia secondary to blood loss (chronic): Secondary | ICD-10-CM

## 2016-11-19 DIAGNOSIS — D649 Anemia, unspecified: Secondary | ICD-10-CM | POA: Diagnosis not present

## 2016-11-19 LAB — PREPARE RBC (CROSSMATCH)

## 2016-11-19 MED ORDER — DIPHENHYDRAMINE HCL 25 MG PO CAPS
25.0000 mg | ORAL_CAPSULE | Freq: Once | ORAL | Status: AC
  Administered 2016-11-19: 25 mg via ORAL

## 2016-11-19 MED ORDER — ACETAMINOPHEN 325 MG PO TABS
ORAL_TABLET | ORAL | Status: AC
  Filled 2016-11-19: qty 2

## 2016-11-19 MED ORDER — DIPHENHYDRAMINE HCL 25 MG PO CAPS
ORAL_CAPSULE | ORAL | Status: AC
  Filled 2016-11-19: qty 1

## 2016-11-19 MED ORDER — ACETAMINOPHEN 325 MG PO TABS
650.0000 mg | ORAL_TABLET | Freq: Once | ORAL | Status: AC
  Administered 2016-11-19: 650 mg via ORAL

## 2016-11-19 MED ORDER — SODIUM CHLORIDE 0.9 % IV SOLN
250.0000 mL | Freq: Once | INTRAVENOUS | Status: AC
  Administered 2016-11-19: 250 mL via INTRAVENOUS

## 2016-11-19 NOTE — Patient Instructions (Signed)
Blood Transfusion, Adult, Care After This sheet gives you information about how to care for yourself after your procedure. Your health care provider may also give you more specific instructions. If you have problems or questions, contact your health care provider. What can I expect after the procedure? After your procedure, it is common to have:  Bruising and soreness where the IV tube was inserted.  Headache.  Follow these instructions at home:  Take over-the-counter and prescription medicines only as told by your health care provider.  Return to your normal activities as told by your health care provider.  Follow instructions from your health care provider about how to take care of your IV insertion site. Make sure you: ? Wash your hands with soap and water before you change your bandage (dressing). If soap and water are not available, use hand sanitizer. ? Change your dressing as told by your health care provider.  Check your IV insertion site every day for signs of infection. Check for: ? More redness, swelling, or pain. ? More fluid or blood. ? Warmth. ? Pus or a bad smell. Contact a health care provider if:  You have more redness, swelling, or pain around the IV insertion site.  You have more fluid or blood coming from the IV insertion site.  Your IV insertion site feels warm to the touch.  You have pus or a bad smell coming from the IV insertion site.  Your urine turns pink, red, or brown.  You feel weak after doing your normal activities. Get help right away if:  You have signs of a serious allergic or immune system reaction, including: ? Itchiness. ? Hives. ? Trouble breathing. ? Anxiety. ? Chest or lower back pain. ? Fever, flushing, and chills. ? Rapid pulse. ? Rash. ? Diarrhea. ? Vomiting. ? Dark urine. ? Serious headache. ? Dizziness. ? Stiff neck. ? Yellow coloration of the face or the white parts of the eyes (jaundice). This information is not  intended to replace advice given to you by your health care provider. Make sure you discuss any questions you have with your health care provider. Document Released: 02/14/2014 Document Revised: 09/23/2015 Document Reviewed: 08/10/2015 Elsevier Interactive Patient Education  [employer].

## 2016-11-20 LAB — TYPE AND SCREEN
ABO/RH(D): O POS
Antibody Screen: NEGATIVE
UNIT DIVISION: 0
Unit division: 0

## 2016-11-20 LAB — BPAM RBC
Blood Product Expiration Date: 201811032359
Blood Product Expiration Date: 201811032359
ISSUE DATE / TIME: 201810130851
ISSUE DATE / TIME: 201810130856
Unit Type and Rh: 5100
Unit Type and Rh: 5100

## 2016-11-20 MED ORDER — ONDANSETRON HCL 4 MG PO TABS: 4.0000 mg | ORAL_TABLET | Freq: Three times a day (TID) | ORAL | 0 refills | Status: DC | PRN

## 2016-11-21 MED FILL — ONDANSETRON HCL 4 MG TABLET: 4 | 3 days supply | Qty: 10 | Fill #0

## 2016-11-24 MED FILL — PANTOPRAZOLE SOD DR 40 MG T: 40 | 90 days supply | Qty: 180 | Fill #1

## 2016-11-30 ENCOUNTER — Other Ambulatory Visit (HOSPITAL_BASED_OUTPATIENT_CLINIC_OR_DEPARTMENT_OTHER): Payer: 59

## 2016-11-30 ENCOUNTER — Encounter (HOSPITAL_COMMUNITY): Payer: Self-pay | Admitting: Emergency Medicine

## 2016-11-30 ENCOUNTER — Telehealth: Payer: Self-pay

## 2016-11-30 ENCOUNTER — Observation Stay (HOSPITAL_COMMUNITY)
Admission: EM | Admit: 2016-11-30 | Discharge: 2016-12-03 | Disposition: A | Discharge status: Home / Self Care | Payer: 59 | Attending: Internal Medicine | Admitting: Internal Medicine

## 2016-11-30 DIAGNOSIS — Z6836 Body mass index (BMI) 36.0-36.9, adult: Secondary | ICD-10-CM | POA: Insufficient documentation

## 2016-11-30 DIAGNOSIS — K766 Portal hypertension: Secondary | ICD-10-CM | POA: Diagnosis not present

## 2016-11-30 DIAGNOSIS — Z87891 Personal history of nicotine dependence: Secondary | ICD-10-CM | POA: Insufficient documentation

## 2016-11-30 DIAGNOSIS — E1122 Type 2 diabetes mellitus with diabetic chronic kidney disease: Secondary | ICD-10-CM | POA: Diagnosis not present

## 2016-11-30 DIAGNOSIS — D689 Coagulation defect, unspecified: Secondary | ICD-10-CM

## 2016-11-30 DIAGNOSIS — Z951 Presence of aortocoronary bypass graft: Secondary | ICD-10-CM | POA: Diagnosis not present

## 2016-11-30 DIAGNOSIS — Z79899 Other long term (current) drug therapy: Secondary | ICD-10-CM | POA: Diagnosis not present

## 2016-11-30 DIAGNOSIS — K3189 Other diseases of stomach and duodenum: Secondary | ICD-10-CM | POA: Diagnosis not present

## 2016-11-30 DIAGNOSIS — K7460 Unspecified cirrhosis of liver: Secondary | ICD-10-CM | POA: Diagnosis not present

## 2016-11-30 DIAGNOSIS — I8510 Secondary esophageal varices without bleeding: Secondary | ICD-10-CM | POA: Insufficient documentation

## 2016-11-30 DIAGNOSIS — N189 Chronic kidney disease, unspecified: Secondary | ICD-10-CM | POA: Diagnosis not present

## 2016-11-30 DIAGNOSIS — I130 Hypertensive heart and chronic kidney disease with heart failure and stage 1 through stage 4 chronic kidney disease, or unspecified chronic kidney disease: Secondary | ICD-10-CM | POA: Insufficient documentation

## 2016-11-30 DIAGNOSIS — D696 Thrombocytopenia, unspecified: Secondary | ICD-10-CM | POA: Diagnosis present

## 2016-11-30 DIAGNOSIS — D62 Acute posthemorrhagic anemia: Secondary | ICD-10-CM | POA: Diagnosis not present

## 2016-11-30 DIAGNOSIS — D500 Iron deficiency anemia secondary to blood loss (chronic): Secondary | ICD-10-CM

## 2016-11-30 DIAGNOSIS — I5032 Chronic diastolic (congestive) heart failure: Secondary | ICD-10-CM | POA: Diagnosis present

## 2016-11-30 DIAGNOSIS — K7581 Nonalcoholic steatohepatitis (NASH): Secondary | ICD-10-CM | POA: Insufficient documentation

## 2016-11-30 DIAGNOSIS — E119 Type 2 diabetes mellitus without complications: Secondary | ICD-10-CM

## 2016-11-30 DIAGNOSIS — I2510 Atherosclerotic heart disease of native coronary artery without angina pectoris: Secondary | ICD-10-CM | POA: Diagnosis not present

## 2016-11-30 DIAGNOSIS — E782 Mixed hyperlipidemia: Secondary | ICD-10-CM | POA: Diagnosis not present

## 2016-11-30 DIAGNOSIS — D649 Anemia, unspecified: Secondary | ICD-10-CM | POA: Diagnosis not present

## 2016-11-30 DIAGNOSIS — K922 Gastrointestinal hemorrhage, unspecified: Secondary | ICD-10-CM | POA: Diagnosis present

## 2016-11-30 DIAGNOSIS — E8881 Metabolic syndrome: Secondary | ICD-10-CM | POA: Diagnosis not present

## 2016-11-30 DIAGNOSIS — K921 Melena: Secondary | ICD-10-CM | POA: Insufficient documentation

## 2016-11-30 DIAGNOSIS — R799 Abnormal finding of blood chemistry, unspecified: Secondary | ICD-10-CM | POA: Diagnosis present

## 2016-11-30 DIAGNOSIS — I5033 Acute on chronic diastolic (congestive) heart failure: Secondary | ICD-10-CM | POA: Diagnosis present

## 2016-11-30 LAB — CBC
HCT: 21.2 % — ABNORMAL LOW (ref 39.0–52.0)
Hemoglobin: 6.9 g/dL — CL (ref 13.0–17.0)
MCH: 27.6 pg (ref 26.0–34.0)
MCHC: 32.5 g/dL (ref 30.0–36.0)
MCV: 84.8 fL (ref 78.0–100.0)
PLATELETS: 123 10*3/uL — AB (ref 150–400)
RBC: 2.5 MIL/uL — ABNORMAL LOW (ref 4.22–5.81)
RDW: 17.6 % — AB (ref 11.5–15.5)
WBC: 6.1 10*3/uL (ref 4.0–10.5)

## 2016-11-30 LAB — COMPREHENSIVE METABOLIC PANEL
ALBUMIN: 2.9 g/dL — AB (ref 3.5–5.0)
ALK PHOS: 97 U/L (ref 38–126)
ALT: 24 U/L (ref 17–63)
AST: 33 U/L (ref 15–41)
Anion gap: 8 (ref 5–15)
BILIRUBIN TOTAL: 3.7 mg/dL — AB (ref 0.3–1.2)
BUN: 16 mg/dL (ref 6–20)
CALCIUM: 8.4 mg/dL — AB (ref 8.9–10.3)
CO2: 23 mmol/L (ref 22–32)
CREATININE: 1.21 mg/dL (ref 0.61–1.24)
Chloride: 105 mmol/L (ref 101–111)
GFR calc Af Amer: >60 mL/min (ref >60)
GFR calc non Af Amer: >60 mL/min (ref >60)
GLUCOSE: 101 mg/dL — AB (ref 65–99)
Potassium: 3.8 mmol/L (ref 3.5–5.1)
SODIUM: 136 mmol/L (ref 135–145)
Total Protein: 6.2 g/dL — ABNORMAL LOW (ref 6.5–8.1)

## 2016-11-30 LAB — CBC & DIFF AND RETIC
BASO%: 1.4 % (ref 0.0–2.0)
Basophils Absolute: 0.1 10*3/uL (ref 0.0–0.1)
EOS%: 9.3 % — ABNORMAL HIGH (ref 0.0–7.0)
Eosinophils Absolute: 0.4 10*3/uL (ref 0.0–0.5)
HEMATOCRIT: 21.5 % — AB (ref 38.4–49.9)
HGB: 6.9 g/dL — CL (ref 13.0–17.1)
Immature Retic Fract: 15.4 % — ABNORMAL HIGH (ref 3.00–10.60)
LYMPH%: 17.8 % (ref 14.0–49.0)
MCH: 26.8 pg — ABNORMAL LOW (ref 27.2–33.4)
MCHC: 32.2 g/dL (ref 32.0–36.0)
MCV: 83 fL (ref 79.3–98.0)
MONO#: 0.7 10*3/uL (ref 0.1–0.9)
MONO%: 15.2 % — AB (ref 0.0–14.0)
NEUT%: 56.3 % (ref 39.0–75.0)
NEUTROS ABS: 2.6 10*3/uL (ref 1.5–6.5)
PLATELETS: 113 10*3/uL — AB (ref 140–400)
RBC: 2.6 10*6/uL — AB (ref 4.20–5.82)
RDW: 19.3 % — ABNORMAL HIGH (ref 11.0–14.6)
RETIC CT ABS: 82.94 10*3/uL (ref 34.80–93.90)
Retic %: 3.19 % — ABNORMAL HIGH (ref 0.80–1.80)
WBC: 4.7 10*3/uL (ref 4.0–10.3)
lymph#: 0.8 10*3/uL — ABNORMAL LOW (ref 0.9–3.3)
nRBC: 0 % (ref 0–0)

## 2016-11-30 LAB — PREPARE RBC (CROSSMATCH)

## 2016-11-30 MED ORDER — METOPROLOL TARTRATE 25 MG PO TABS
12.5000 mg | ORAL_TABLET | Freq: Two times a day (BID) | ORAL | Status: DC
  Administered 2016-12-01 – 2016-12-02 (×3): 12.5 mg via ORAL
  Filled 2016-11-30 (×5): qty 1

## 2016-11-30 MED ORDER — ACETAMINOPHEN 325 MG PO TABS: 650.0000 mg | ORAL_TABLET | Freq: Four times a day (QID) | ORAL | Status: DC | PRN

## 2016-11-30 MED ORDER — SPIRONOLACTONE 25 MG PO TABS
25.0000 mg | ORAL_TABLET | Freq: Every day | ORAL | Status: DC
  Administered 2016-11-30 – 2016-12-02 (×3): 25 mg via ORAL
  Filled 2016-11-30 (×3): qty 1

## 2016-11-30 MED ORDER — FLUTICASONE PROPIONATE 50 MCG/ACT NA SUSP
2.0000 | Freq: Every day | NASAL | Status: DC | PRN
  Filled 2016-11-30: qty 16

## 2016-11-30 MED ORDER — DOCUSATE SODIUM 100 MG PO CAPS: 100.0000 mg | ORAL_CAPSULE | Freq: Two times a day (BID) | ORAL | Status: DC | PRN

## 2016-11-30 MED ORDER — TRANEXAMIC ACID 650 MG PO TABS
650.0000 mg | ORAL_TABLET | Freq: Three times a day (TID) | ORAL | Status: DC
  Administered 2016-11-30 – 2016-12-02 (×7): 650 mg via ORAL
  Filled 2016-11-30 (×8): qty 1

## 2016-11-30 MED ORDER — SODIUM CHLORIDE 0.9 % IV SOLN: 10.0000 mL/h | Freq: Once | INTRAVENOUS | Status: DC

## 2016-11-30 MED ORDER — TORSEMIDE 20 MG PO TABS
40.0000 mg | ORAL_TABLET | Freq: Every day | ORAL | Status: DC
  Administered 2016-12-01 – 2016-12-02 (×2): 40 mg via ORAL
  Filled 2016-11-30 (×3): qty 2

## 2016-11-30 NOTE — Telephone Encounter (Signed)
Error

## 2016-11-30 NOTE — ED Notes (Signed)
Bed: VH29 Expected date:  Expected time:  Means of arrival:  Comments: Triage 1

## 2016-11-30 NOTE — ED Triage Notes (Signed)
Pt sent by provider related to hemoglobin of 6.9; hx of multiple GI bleeds. Pt verbalizes symptoms of fatigue and nausea.

## 2016-11-30 NOTE — ED Notes (Signed)
Critical Hemoglobin reported at 1535  6.9

## 2016-11-30 NOTE — H&P (Signed)
History and Physical    STEPHANIE MCGLONE HDQ:[id number] DOB: 12/30/1950 DOA: 11/30/2016  PCP: Tammi Sou, MD  Patient coming from: Home.   I have personally briefly reviewed patient's old medical records in [place]  Chief Complaint: low hemoglobin.   HPI: William [name] is a 66 y.o. male with medical history significant of AAA, CAD, NASH, liver cirrhosis. Hypertension, chronic diastolic heart failure, comes in from cancer canter to ED for a hemoglobin of 6.9. The patient has gone multiple endoscopic procedure recently by GI Dr. [name] and sewed portal hypertensive gastropathy. The patient recently had spiral enterography at [facility] without any obvious source of bleeding. They found few nonbleeding jejunal polyps which were clipped and removed. His recent procedure was last week where he underwent endoscopy/ enterograph and was found to have possible AVM'S.  He was recommended to follow up with [facility]. Today he underwent blood work at [place] [facility], was found to have hemoglobin of 6.9 and was referred for admission to med service for blood transfusion.  He remains totally asymptomatic. Occasional melena. No other complaints.   Review of Systems: As per HPI otherwise 10 point review of systems negative.    Past Medical History:  Diagnosis Date  . AAA (abdominal aortic aneurysm) ([place]) 03/2014   3.2 cm on MR abd.  F/u aortic u/s 06/2014 showed 3.0 x 3.1 cm AAA: recheck 2 yrs recommended (followed by cardiologist).  Minimal growth on 10/2016 CT abd done for epig pain.  Repeat u/s 10/2018.  [name] Anemia   . Bilateral renal cysts    simple (03/2014 MRI)  . CAD (coronary artery disease)   . Cholelithiases 2018   asymptomatic  . Chronic diastolic heart failure ([place])   . Cirrhosis ([place]) 05/2016   secondary to NASH; most recent u/s abd 07/2016 showed splenomegaly.  Hx of portal HTN changes with mild ascites and splenomegaly.  . Diabetes mellitus with complication ([place]) 12/9415   A1c  6.8%  . History of blood transfusion 2018 X 4 dates   "low blood" (09/21/2016)  . Hyperlipemia, mixed    elevated LFTs when on statins.    . Hypertension    Cr bump 04/01/16 so I changed benicar-hct to benicar plain and added amlodipine 5 mg.  . Iron deficiency anemia 05/2016   Acute blood loss anemia: hospitalized, required transfusion x 3 U: colonoscopy and capsule study unrevealing.  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  W/u for ? hemolytic anemia to be pursued by hematologist as outpt.  Appt with Dr. Cristi Loron at [facility] 09/13/16.  . Microscopic hematuria    Eval unremarkable by Dr. Eulogio Ditch.  [name] NASH (nonalcoholic steatohepatitis)    Fatty liver on MR abd 03/2014, + hx of elevated transaminases and bili: followed by Dr. [name].  . Obesity   . Spleen enlarged     Past Surgical History:  Procedure Laterality Date  . CARDIAC CATHETERIZATION    . CARDIOVASCULAR STRESS TEST  01/17/2012; 05/04/16   Normal stress nuclear study x 2 (2018--Normal perfusion. LVEF 66% with normal wall motion. This is a low risk study).  . CERVICAL DISCECTOMY  1992  . COLONOSCOPY N/A 05/27/2016   No site/explanation for blood loss found.  Erythematous mucosa in cecum and ascending colon--Cecal bx: normal.  Procedure: COLONOSCOPY;  Surgeon: Carol Ada, MD;  Location: [facility] ENDOSCOPY;  Service: Endoscopy;  Laterality: N/A;  . COLONOSCOPY W/ POLYPECTOMY  09/10/2014   Polypectomy x 2: recall 5 yrs (Dr. [name]).  . CORONARY ANGIOPLASTY    . CORONARY ARTERY BYPASS GRAFT  03/2006  . ENTEROSCOPY N/A 07/31/2016   Procedure: ENTEROSCOPY;  Surgeon: Ladene Artist, MD;  Location[facility]COPY;  Service: Endoscopy;  Laterality: N/A;  . ESOPHAGOGASTRODUODENOSCOPY N/A 05/25/2016   No site/explanation for blood loss found.  Procedure: ESOPHAGOGASTRODUODENOSCOPY (EGD);  Surgeon: Juanita Craver, MD;  Location: [facility] ENDOSCOPY;  Service: Endoscopy;   Laterality: N/A;  . ESOPHAGOGASTRODUODENOSCOPY N/A 09/23/2016   Procedure: ESOPHAGOGASTRODUODENOSCOPY (EGD);  Surgeon: Carol Ada, MD;  Location: [place];  Service: Endoscopy;  Laterality: N/A;  . GIVENS CAPSULE STUDY N/A 05/27/2016   No source identified.  Repeat 10/2016--results pending.  Procedure: GIVENS CAPSULE STUDY;  Surgeon: Carol Ada, MD;  Location: [facility] ENDOSCOPY;  Service: Endoscopy;  Laterality: N/A;  . GIVENS CAPSULE STUDY N/A 10/15/2016   Procedure: GIVENS CAPSULE STUDY;  Surgeon: Carol Ada, MD;  Location: [facility] ENDOSCOPY;  Service: Endoscopy;  Laterality: N/A;  . NECK SURGERY  1991  . SMALL BOWEL ENTEROSCOPY  10/2016   ([facility], Dr. [name]: Double balloon enteroscopy--to the level of proximal ileum) jejunal polyps- which were removed--not bleeding & benign.  No source of bleeding was identified.  . TRANSTHORACIC ECHOCARDIOGRAM  05/25/2016   EF 60%, normal wall motion, grd II DD, mild aortic stenosis, dilated aortic root and ascending aorta.  [name] VASECTOMY  1977     reports that he has quit smoking. His smoking use included Cigarettes. He has never used smokeless tobacco. He reports that he does not drink alcohol or use drugs.  Allergies  Allergen Reactions  . Statins Other (See Comments)    Liver enzymes go up whenever on them    Family History  Problem Relation Age of Onset  . Hypertension Mother   . Cancer - Other Mother        liver cancer  . Heart disease Father   . Heart attack Father   . Cancer - Lung Father   . Diabetes Father   . Liver disease Sister   . Anemia Sister   . Heart disease Sister   . Heart attack Sister   . Heart disease Brother        CABG 20 YEARS AGO  . Hypertension Brother   . Mesothelioma Brother        HALF-BROTHER  . Cancer - Other Brother        CLL  . Cancer - Lung Brother        Mets to brain  . Cancer - Lung Sister        HALF-SISTER  . Liver disease Brother   . Heart disease Brother        CABG-2012  . Emphysema Maternal  Grandfather   . Cancer - Other Paternal Grandmother        Stomach  . Heart attack Paternal Grandfather    Reviewed.   Prior to Admission medications   Medication Sig Start Date End Date Taking? Authorizing Provider  acetaminophen (TYLENOL) 325 MG tablet Take 650 mg by mouth every 6 (six) hours as needed for fever.    [provider]  cholecalciferol (VITAMIN D) 1000 units tablet Take 1,000 Units by mouth daily.    [provider]  docusate sodium (COLACE) 100 MG capsule Take 100 mg by mouth 2 (two) times daily as needed for mild constipation (1 - 2 tabs as needed).    [provider]  Evolocumab (REPATHA SURECLICK)  140 MG/ML SOAJ Inject 140 mg into the skin every 14 (fourteen) days. Patient not taking: Reported on 11/16/2016 11/09/16   Troy Sine, MD  fluticasone [facility]) 50 MCG/ACT nasal spray Place 2 sprays into both nostrils daily. Patient taking differently: Place 2 sprays into both nostrils daily as needed for allergies.  01/01/16   Withrow, Elyse Jarvis, FNP  metoprolol tartrate (LOPRESSOR) 25 MG tablet Take 0.5 tablets (12.5 mg total) by mouth 2 (two) times daily. 06/22/16   McGowen, Adrian Blackwater, MD  omega-3 acid ethyl esters (LOVAZA) 1 g capsule TAKE 2 CAPSULES BY MOUTH ONCE DAILY Patient taking differently: TAKE 2 CAPSULES BY MOUTH DAILY 03/24/16   Troy Sine, MD  ondansetron (ZOFRAN) 4 MG tablet Take 1 tablet (4 mg total) by mouth every 8 (eight) hours as needed for nausea or vomiting. 11/20/16   Brunetta Genera, MD  pantoprazole (PROTONIX) 40 MG tablet Take 1 tablet (40 mg total) by mouth 2 (two) times daily before a meal. 10/11/16   McGowen, Adrian Blackwater, MD  potassium chloride SA (K-DUR,KLOR-CON) 20 MEQ tablet Take 1 tablet (20 mEq total) by mouth 2 (two) times daily. 06/22/16   McGowen, Adrian Blackwater, MD  rosuvastatin (CRESTOR) 5 MG tablet Take 1 tablet by mouth 1-2 times per week as tolerated 11/17/16   Troy Sine, MD  spironolactone (ALDACTONE) 25 MG  tablet Take 1 tablet (25 mg total) by mouth daily. 06/22/16   McGowen, Adrian Blackwater, MD  torsemide (DEMADEX) 20 MG tablet Take 2 tablets (40 mg total) by mouth daily. 06/22/16   McGowen, Adrian Blackwater, MD  tranexamic acid (LYSTEDA) 650 MG TABS tablet Take 1 tablet (650 mg total) by mouth 3 (three) times daily. 11/16/16   Brunetta Genera, MD    Physical Exam: Vitals:   11/30/16 1442 11/30/16 1648 11/30/16 1739 11/30/16 1800  BP: 124/75 140/66 (!) 148/64 129/69  Pulse: 79 90 88 89  Resp: 18 18 17 18   Temp: 98.4 F (36.9 C)   98.1 F (36.7 C)  TempSrc: Oral   Oral  SpO2: 97% 100% 99% 100%  Weight: 108 kg (238 lb)   108 kg (238 lb)  Height: 5' 8"  (1.727 m)   5' 8"  (1.727 m)    Constitutional: NAD, calm, comfortable Vitals:   11/30/16 1442 11/30/16 1648 11/30/16 1739 11/30/16 1800  BP: 124/75 140/66 (!) 148/64 129/69  Pulse: 79 90 88 89  Resp: 18 18 17 18   Temp: 98.4 F (36.9 C)   98.1 F (36.7 C)  TempSrc: Oral   Oral  SpO2: 97% 100% 99% 100%  Weight: 108 kg (238 lb)   108 kg (238 lb)  Height: 5' 8"  (1.727 m)   5' 8"  (1.727 m)   Eyes: PERRL, lids and conjunctivae normal ENMT: Mucous membranes are moist. Posterior pharynx clear of any exudate or lesions.Normal dentition.  Neck: normal, supple, no masses, no thyromegaly Respiratory: clear to auscultation bilaterally, no wheezing, no crackles. Normal respiratory effort. No accessory muscle use.  Cardiovascular: Regular rate and rhythm, no murmurs / rubs / gallops. No extremity edema. 2+ pedal pulses. No carotid bruits.  Abdomen: no tenderness, no masses palpated. No hepatosplenomegaly. Bowel sounds positive.  Musculoskeletal: no clubbing / cyanosis. No joint deformity upper and lower extremities. Good ROM, no contractures. Normal muscle tone.  Skin: no rashes, lesions, ulcers. No induration Neurologic: CN 2-12 grossly intact. Sensation intact, DTR normal. Strength 5/5 in all 4.  Psychiatric: Normal judgment and insight. Alert and  oriented x 3. Normal mood.     Labs on Admission: I have personally reviewed following labs and imaging studies  CBC:  Recent Labs Lab 11/30/16 0822 11/30/16 1451  WBC 4.7 6.1  NEUTROABS 2.6  --   HGB 6.9* 6.9*  HCT 21.5* 21.2*  MCV 83.0 84.8  PLT 113* 703*   Basic Metabolic Panel:  Recent Labs Lab 11/30/16 1451  NA 136  K 3.8  CL 105  CO2 23  GLUCOSE 101*  BUN 16  CREATININE 1.21  CALCIUM 8.4*   GFR: Estimated Creatinine Clearance: 70.6 mL/min (by C-G formula based on SCr of 1.21 mg/dL). Liver Function Tests:  Recent Labs Lab 11/30/16 1451  AST 33  ALT 24  ALKPHOS 97  BILITOT 3.7*  PROT 6.2*  ALBUMIN 2.9*   No results for input(s): LIPASE, AMYLASE in the last 168 hours. No results for input(s): AMMONIA in the last 168 hours. Coagulation Profile: No results for input(s): INR, PROTIME in the last 168 hours. Cardiac Enzymes: No results for input(s): CKTOTAL, CKMB, CKMBINDEX, TROPONINI in the last 168 hours. BNP (last 3 results)  Recent Labs  07/28/16 1416  PROBNP 120   HbA1C: No results for input(s): HGBA1C in the last 72 hours. CBG: No results for input(s): GLUCAP in the last 168 hours. Lipid Profile: No results for input(s): CHOL, HDL, LDLCALC, TRIG, CHOLHDL, LDLDIRECT in the last 72 hours. Thyroid Function Tests: No results for input(s): TSH, T4TOTAL, FREET4, T3FREE, THYROIDAB in the last 72 hours. Anemia Panel:  Recent Labs  11/30/16 0822  RETICCTPCT 3.19*   Urine analysis:    Component Value Date/Time   COLORURINE AMBER (A) 11/01/2016 1343   APPEARANCEUR CLEAR 11/01/2016 1343   LABSPEC 1.023 11/01/2016 1343   PHURINE 5.0 11/01/2016 1343   GLUCOSEU NEGATIVE 11/01/2016 1343   HGBUR NEGATIVE 11/01/2016 1343   BILIRUBINUR NEGATIVE 11/01/2016 1343   KETONESUR NEGATIVE 11/01/2016 1343   PROTEINUR NEGATIVE 11/01/2016 1343   NITRITE NEGATIVE 11/01/2016 1343   LEUKOCYTESUR NEGATIVE 11/01/2016 1343    Radiological Exams on  Admission: No results found.  EKG: not done.   Assessment/Plan Active Problems:   Anemia    Iron deficiency anemia/ anemia of chronic blood loss:  Probably secondary to AVM'S.  Will probably need another capsule endoscopy.  Will call dR Hung in am for further evaluation.  Transfuse 1 unit of prbc and recheck H&H in am.  Keep hemoglobin greater than 7.    Chronic diastolic heart failure.  Compensated. Resume home meds    CAD:  NO chest pain.    H/o of NASH:  LIVER cirrhosis and chronic thrombocytopenia:  Outpatient follow up.    Hypertension;  Well controlled.         DVT prophylaxis: SCD'S Code Status: full code.  Family Communication: discussed the plan with the patient.  Disposition Plan: pending eval by Dr [name].  Consults called: GI in am.  Admission status: med surg and obs.    Hosie Poisson MD [facility] Pager [phone]  If 7PM-7AM, please contact night-coverage [email or web address] Password [facility]  11/30/2016, 6:31 PM

## 2016-11-30 NOTE — ED Provider Notes (Signed)
[facility] DEPT Provider Note   CSN: [id number] Arrival date & time: 11/30/16  1401     History   Chief Complaint Chief Complaint  Patient presents with  . Abnormal Lab    HPI William [name] is a 66 y.o. male.  HPI   Patietn is a 61 year  medical comorbidities including liver cirrhosis related to [facility] with portal hypertension, esophageal varices and portal hypertensive gastropathy and splenomegaly, iron deficiency anemia, obesity, diabetes, AAA. Patient has been admitted twice in the last year requiring transfusion, 5 PRBCs each.  Patient has likely GI bleed has been unable to be found.  Patient has had endoscopy colonoscopy and capsule studies all of which have been negative. Dr. [name] is his gastric oncologist  Pt also has  iron deficiency.   Patient started following with Dr.Kale, 10 days ago he was found to have a hemoglobin at 7.2.  He was scheduled for outpatient transfusion of 2 PRBCs.  Patient had it rechecked today and his hemoglobin is now 6.9.  Patient was called and told to come to the emergency department for admission.    But remarks that he has mild abdominal cramping.  Maybe had may be a little bit of "orange stool" yesterday.  Past Medical History:  Diagnosis Date  . AAA (abdominal aortic aneurysm) ([place]) 03/2014   3.2 cm on MR abd.  F/u aortic u/s 06/2014 showed 3.0 x 3.1 cm AAA: recheck 2 yrs recommended (followed by cardiologist).  Minimal growth on 10/2016 CT abd done for epig pain.  Repeat u/s 10/2018.  [name] Anemia   . Bilateral renal cysts    simple (03/2014 MRI)  . CAD (coronary artery disease)   . Cholelithiases 2018   asymptomatic  . Chronic diastolic heart failure ([place])   . Cirrhosis ([place]) 05/2016   secondary to NASH; most recent u/s abd 07/2016 showed splenomegaly.  Hx of portal HTN changes with mild ascites and splenomegaly.  . Diabetes mellitus with complication ([place]) 14/4315   A1c 6.8%  . History of blood transfusion  2018 X 4 dates   "low blood" (09/21/2016)  . Hyperlipemia, mixed    elevated LFTs when on statins.    . Hypertension    Cr bump 04/01/16 so I changed benicar-hct to benicar plain and added amlodipine 5 mg.  . Iron deficiency anemia 05/2016   Acute blood loss anemia: hospitalized, required transfusion x 3 U: colonoscopy and capsule study unrevealing.  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  W/u for ? hemolytic anemia to be pursued by hematologist as outpt.  Appt with [facility] Cristi Loron at [facility] 09/13/16.  . Microscopic hematuria    Eval unremarkable by Dr. Eulogio Ditch.  [name] NASH (nonalcoholic steatohepatitis)    Fatty liver on MR abd 03/2014, + hx of elevated transaminases and bili: followed by Dr. [name].  . Obesity   . Spleen enlarged     Patient Active Problem List   Diagnosis Date Noted  . Anemia 10/14/2016  . Chronic GI bleeding   . Acute blood loss anemia 08/31/2016  . Iron deficiency anemia due to chronic blood loss 08/31/2016  . Acute GI bleeding 08/31/2016  . Cirrhosis, nonalcoholic ([place]) 40/09/6759  . Portal hypertension ([place]) 08/31/2016  . Splenomegaly 08/31/2016  . Esophageal varices ([place]) 08/31/2016  . Symptomatic anemia 07/30/2016  . Thrombocytopenia ([place]) 07/29/2016  . Weakness 07/28/2016  . Aortic stenosis, mild 07/28/2016  .  Chronic diastolic CHF (congestive heart failure) ([place])   . History of GI bleed 05/22/2016  . Pulmonary edema 05/22/2016  . Acute on chronic renal failure ([place]) 05/22/2016  . Non-insulin treated type 2 diabetes mellitus ([place]) 05/22/2016  . Moderate obesity 11/15/2013  . Abdominal aortic aneurysm ([place]) 03/19/2013  . Hematuria 03/19/2013  . Morbid obesity ([place]) 08/24/2012  . 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 Metabolic syndrome 93/79/0240  . Essential hypertension 08/24/2012  . Mixed hyperlipidemia 08/24/2012    Past Surgical History:  Procedure Laterality Date  .  CARDIAC CATHETERIZATION    . CARDIOVASCULAR STRESS TEST  01/17/2012; 05/04/16   Normal stress nuclear study x 2 (2018--Normal perfusion. LVEF 66% with normal wall motion. This is a low risk study).  . CERVICAL DISCECTOMY  1992  . COLONOSCOPY N/A 05/27/2016   No site/explanation for blood loss found.  Erythematous mucosa in cecum and ascending colon--Cecal bx: normal.  Procedure: COLONOSCOPY;  Surgeon: Carol Ada, MD;  Location: [facility] ENDOSCOPY;  Service: Endoscopy;  Laterality: N/A;  . COLONOSCOPY W/ POLYPECTOMY  09/10/2014   Polypectomy x 2: recall 5 yrs (Dr. [name]).  . CORONARY ANGIOPLASTY    . CORONARY ARTERY BYPASS GRAFT  03/2006  . ENTEROSCOPY N/A 07/31/2016   Procedure: ENTEROSCOPY;  Surgeon: Ladene Artist, MD;  Location: [facility] ENDOSCOPY;  Service: Endoscopy;  Laterality: N/A;  . ESOPHAGOGASTRODUODENOSCOPY N/A 05/25/2016   No site/explanation for blood loss found.  Procedure: ESOPHAGOGASTRODUODENOSCOPY (EGD);  Surgeon: Juanita Craver, MD;  Location: [facility] ENDOSCOPY;  Service: Endoscopy;  Laterality: N/A;  . ESOPHAGOGASTRODUODENOSCOPY N/A 09/23/2016   Procedure: ESOPHAGOGASTRODUODENOSCOPY (EGD);  Surgeon: Carol Ada, MD;  Location: [facility];  Service: Endoscopy;  Laterality: N/A;  . GIVENS CAPSULE STUDY N/A 05/27/2016   No source identified.  Repeat 10/2016--results pending.  Procedure: GIVENS CAPSULE STUDY;  Surgeon: Carol Ada, MD;  Location: [facility] ENDOSCOPY;  Service: Endoscopy;  Laterality: N/A;  . GIVENS CAPSULE STUDY N/A 10/15/2016   Procedure: GIVENS CAPSULE STUDY;  Surgeon: Carol Ada, MD;  Location: [facility] ENDOSCOPY;  Service: Endoscopy;  Laterality: N/A;  . NECK SURGERY  1991  . SMALL BOWEL ENTEROSCOPY  10/2016   ([facility], Dr. [name]: Double balloon enteroscopy--to the level of proximal ileum) jejunal polyps- which were removed--not bleeding & benign.  No source of bleeding was identified.  . TRANSTHORACIC ECHOCARDIOGRAM  05/25/2016   EF 60%, normal wall motion, grd II DD, mild aortic stenosis,  dilated aortic root and ascending aorta.  [name] VASECTOMY  1977       Home Medications    Prior to Admission medications   Medication Sig Start Date End Date Taking? Authorizing Provider  acetaminophen (TYLENOL) 325 MG tablet Take 650 mg by mouth every 6 (six) hours as needed for fever.    [provider]  cholecalciferol (VITAMIN D) 1000 units tablet Take 1,000 Units by mouth daily.    [provider]  docusate sodium (COLACE) 100 MG capsule Take 100 mg by mouth 2 (two) times daily as needed for mild constipation (1 - 2 tabs as needed).    [provider]  Evolocumab (REPATHA SURECLICK) 973 MG/ML SOAJ Inject 140 mg into the skin every 14 (fourteen) days. Patient not taking: Reported on 11/16/2016 11/09/16   Troy Sine, MD  fluticasone [facility]) 50 MCG/ACT nasal spray Place 2 sprays into both nostrils daily. Patient taking differently: Place 2 sprays into both nostrils daily as needed for allergies.  01/01/16   [place], Elyse Jarvis, FNP  metoprolol tartrate (LOPRESSOR) 25 MG  tablet Take 0.5 tablets (12.5 mg total) by mouth 2 (two) times daily. 06/22/16   McGowen, Adrian Blackwater, MD  omega-3 acid ethyl esters (LOVAZA) 1 g capsule TAKE 2 CAPSULES BY MOUTH ONCE DAILY Patient taking differently: TAKE 2 CAPSULES BY MOUTH DAILY 03/24/16   Troy Sine, MD  ondansetron (ZOFRAN) 4 MG tablet Take 1 tablet (4 mg total) by mouth every 8 (eight) hours as needed for nausea or vomiting. 11/20/16   Brunetta Genera, MD  pantoprazole (PROTONIX) 40 MG tablet Take 1 tablet (40 mg total) by mouth 2 (two) times daily before a meal. 10/11/16   McGowen, Adrian Blackwater, MD  potassium chloride SA (K-DUR,KLOR-CON) 20 MEQ tablet Take 1 tablet (20 mEq total) by mouth 2 (two) times daily. 06/22/16   McGowen, Adrian Blackwater, MD  rosuvastatin (CRESTOR) 5 MG tablet Take 1 tablet by mouth 1-2 times per week as tolerated 11/17/16   Troy Sine, MD  spironolactone (ALDACTONE) 25 MG tablet Take 1 tablet (25 mg  total) by mouth daily. 06/22/16   McGowen, Adrian Blackwater, MD  torsemide (DEMADEX) 20 MG tablet Take 2 tablets (40 mg total) by mouth daily. 06/22/16   McGowen, Adrian Blackwater, MD  tranexamic acid (LYSTEDA) 650 MG TABS tablet Take 1 tablet (650 mg total) by mouth 3 (three) times daily. 11/16/16   Brunetta Genera, MD    Family History Family History  Problem Relation Age of Onset  . Hypertension Mother   . Cancer - Other Mother        liver cancer  . Heart disease Father   . Heart attack Father   . Cancer - Lung Father   . Diabetes Father   . Liver disease Sister   . Anemia Sister   . Heart disease Sister   . Heart attack Sister   . Heart disease Brother        CABG 20 YEARS AGO  . Hypertension Brother   . Mesothelioma Brother        HALF-BROTHER  . Cancer - Other Brother        CLL  . Cancer - Lung Brother        Mets to brain  . Cancer - Lung Sister        HALF-SISTER  . Liver disease Brother   . Heart disease Brother        CABG-2012  . Emphysema Maternal Grandfather   . Cancer - Other Paternal Grandmother        Stomach  . Heart attack Paternal Grandfather     Social History Social History  Substance Use Topics  . Smoking status: Former Smoker    Types: Cigarettes  . Smokeless tobacco: Never Used     Comment: QUIT I 1983  . Alcohol use No     Allergies   Statins   Review of Systems Review of Systems  Constitutional: Negative for activity change and fever.  HENT: Negative for ear pain and sore throat.   Eyes: Negative for pain and visual disturbance.  Respiratory: Negative for cough and shortness of breath.   Cardiovascular: Negative for chest pain.  Gastrointestinal: Negative for diarrhea, nausea and vomiting.  Skin: Negative for rash.  Allergic/Immunologic: Negative for immunocompromised state.  Neurological: Positive for dizziness and light-headedness.  Psychiatric/Behavioral: Negative for agitation.  All other systems reviewed and are  negative.    Physical Exam Updated Vital Signs BP 140/66 (BP Location: Left Arm)   Pulse 90   Temp 98.4 F (  36.9 C) (Oral)   Resp 18   Ht 5' 8"  (1.727 m)   Wt 108 kg (238 lb)   SpO2 100%   BMI 36.19 kg/m   Physical Exam  Constitutional: He appears well-developed and well-nourished.  HENT:  Head: Normocephalic and atraumatic.  Eyes:  Pale conjunctiva.  Neck: Neck supple.  Cardiovascular: Normal rate and regular rhythm.   No murmur heard. Pulmonary/Chest: Effort normal and breath sounds normal. No respiratory distress.  Abdominal: Soft. There is no tenderness.  Musculoskeletal: He exhibits no edema.  Neurological: He is alert.  Skin: Skin is warm and dry.  Psychiatric: He has a normal mood and affect.  Nursing note and vitals reviewed.    ED Treatments / Results  Labs (all labs ordered are listed, but only abnormal results are displayed) Labs Reviewed  COMPREHENSIVE METABOLIC PANEL - Abnormal; Notable for the following:       Result Value   Glucose, Bld 101 (*)    Calcium 8.4 (*)    Total Protein 6.2 (*)    Albumin 2.9 (*)    Total Bilirubin 3.7 (*)    All other components within normal limits  CBC - Abnormal; Notable for the following:    RBC 2.50 (*)    Hemoglobin 6.9 (*)    HCT 21.2 (*)    RDW 17.6 (*)    Platelets 123 (*)    All other components within normal limits  POC OCCULT BLOOD, ED  TYPE AND SCREEN  PREPARE RBC (CROSSMATCH)    EKG  EKG Interpretation None       Radiology No results found.  Procedures Procedures (including critical care time) CRITICAL CARE Performed by: Gardiner Sleeper Total critical care time: 60 minutes Critical care time was exclusive of separately billable procedures and treating other patients. Critical care was necessary to treat or prevent imminent or life-threatening deterioration. Critical care was time spent personally by me on the following activities: development of treatment plan with patient and/or  surrogate as well as nursing, discussions with consultants, evaluation of patient's response to treatment, examination of patient, obtaining history from patient or surrogate, ordering and performing treatments and interventions, ordering and review of laboratory studies, ordering and review of radiographic studies, pulse oximetry and re-evaluation of patient's condition.  Medications Ordered in ED Medications  0.9 %  sodium chloride infusion (not administered)     Initial Impression / Assessment and Plan / ED Course  I have reviewed the triage vital signs and the nursing notes.  Pertinent labs & imaging results that were available during my care of the patient were reviewed by me and considered in my medical decision making (see chart for details).     Patietn is a 65 year  medical comorbidities including liver cirrhosis related to [place] with portal hypertension, esophageal varices and portal hypertensive gastropathy and splenomegaly, iron deficiency anemia, obesity, diabetes, AAA. Patient has been admitted twice in the last year requiring transfusion, 5 PRBCs each.  Patient has likely GI bleed has been unable to be found.  Patient has had endoscopy colonoscopy and capsule studies all of which have been negative. Dr. [name] is his gastric oncologist  Pt also has  iron deficiency.   Patient started following with Dr.Kale, 10 days ago he was found to have a hemoglobin at 7.2.  He was scheduled for outpatient transfusion of 2 PRBCs.  Patient had it rechecked today and his hemoglobin is now 6.9.  Patient was called and told to come to  the emergency department for admission.    But remarks that he has mild abdominal cramping.  Maybe had may be a little bit of "orange stool" yesterday.   5:14 PM Will initiate with transfusion of 1 PRBC.  We will then recheck respiratory status.  Will admit to hosptilist for hgb recheck after transfusion and to watch patietn given further GI losses actuely despite  outpatient trasnfusion.      Final Clinical Impressions(s) / ED Diagnoses   Final diagnoses:  None    New Prescription s New Prescriptions   No medications on file     Macarthur Critchley, MD 11/30/16 2306

## 2016-11-30 NOTE — Telephone Encounter (Signed)
Patient experiencing weakness and fatigue. Diagnosed with chronic GI bleed. Hgb resulted this morning 6.9. Per Dr. Irene Limbo instructed him to go the the ED if he is having severe side effects. Pt aware of how he feels. Plans to go to the ED. At this time, would be difficult to get the patient in for multiple units of blood. Infusion room does not have capability for more than two units at a time, should the patient require this kind of care. Pt verbalized understanding of decision and okay with plan.

## 2016-12-01 DIAGNOSIS — I5032 Chronic diastolic (congestive) heart failure: Secondary | ICD-10-CM

## 2016-12-01 DIAGNOSIS — I130 Hypertensive heart and chronic kidney disease with heart failure and stage 1 through stage 4 chronic kidney disease, or unspecified chronic kidney disease: Secondary | ICD-10-CM | POA: Diagnosis not present

## 2016-12-01 DIAGNOSIS — K7460 Unspecified cirrhosis of liver: Secondary | ICD-10-CM | POA: Diagnosis not present

## 2016-12-01 DIAGNOSIS — D62 Acute posthemorrhagic anemia: Secondary | ICD-10-CM | POA: Diagnosis not present

## 2016-12-01 DIAGNOSIS — K3189 Other diseases of stomach and duodenum: Secondary | ICD-10-CM | POA: Diagnosis not present

## 2016-12-01 DIAGNOSIS — I2510 Atherosclerotic heart disease of native coronary artery without angina pectoris: Secondary | ICD-10-CM | POA: Diagnosis not present

## 2016-12-01 DIAGNOSIS — R195 Other fecal abnormalities: Secondary | ICD-10-CM | POA: Diagnosis not present

## 2016-12-01 DIAGNOSIS — D509 Iron deficiency anemia, unspecified: Secondary | ICD-10-CM | POA: Diagnosis not present

## 2016-12-01 DIAGNOSIS — K7581 Nonalcoholic steatohepatitis (NASH): Secondary | ICD-10-CM | POA: Diagnosis not present

## 2016-12-01 DIAGNOSIS — I8510 Secondary esophageal varices without bleeding: Secondary | ICD-10-CM | POA: Diagnosis not present

## 2016-12-01 DIAGNOSIS — D500 Iron deficiency anemia secondary to blood loss (chronic): Secondary | ICD-10-CM | POA: Diagnosis not present

## 2016-12-01 DIAGNOSIS — K922 Gastrointestinal hemorrhage, unspecified: Secondary | ICD-10-CM | POA: Diagnosis not present

## 2016-12-01 DIAGNOSIS — K766 Portal hypertension: Secondary | ICD-10-CM | POA: Diagnosis not present

## 2016-12-01 LAB — CBC
HCT: 20.8 % — ABNORMAL LOW (ref 39.0–52.0)
HEMATOCRIT: 26.7 % — AB (ref 39.0–52.0)
HEMOGLOBIN: 8.7 g/dL — AB (ref 13.0–17.0)
Hemoglobin: 6.7 g/dL — CL (ref 13.0–17.0)
MCH: 27.1 pg (ref 26.0–34.0)
MCH: 27.5 pg (ref 26.0–34.0)
MCHC: 32.2 g/dL (ref 30.0–36.0)
MCHC: 32.6 g/dL (ref 30.0–36.0)
MCV: 84.2 fL (ref 78.0–100.0)
MCV: 84.5 fL (ref 78.0–100.0)
PLATELETS: 93 10*3/uL — AB (ref 150–400)
Platelets: 97 10*3/uL — ABNORMAL LOW (ref 150–400)
RBC: 2.47 MIL/uL — ABNORMAL LOW (ref 4.22–5.81)
RBC: 3.16 MIL/uL — AB (ref 4.22–5.81)
RDW: 16.9 % — ABNORMAL HIGH (ref 11.5–15.5)
RDW: 16.4 % — ABNORMAL HIGH (ref 11.5–15.5)
WBC: 4.9 10*3/uL (ref 4.0–10.5)
WBC: 4.9 10*3/uL (ref 4.0–10.5)

## 2016-12-01 LAB — GLUCOSE, CAPILLARY
Glucose-Capillary: 127 mg/dL — ABNORMAL HIGH (ref 65–99)
Glucose-Capillary: 109 mg/dL — ABNORMAL HIGH (ref 65–99)

## 2016-12-01 LAB — PREPARE RBC (CROSSMATCH)

## 2016-12-01 MED ORDER — SODIUM CHLORIDE 0.9 % IV SOLN
Freq: Once | INTRAVENOUS | Status: AC
  Administered 2016-12-01: 10:00:00 via INTRAVENOUS

## 2016-12-01 MED ORDER — ROSUVASTATIN CALCIUM 5 MG PO TABS
5.0000 mg | ORAL_TABLET | ORAL | Status: DC
  Administered 2016-12-02: 5 mg via ORAL
  Filled 2016-12-01: qty 1

## 2016-12-01 MED ORDER — POTASSIUM CHLORIDE CRYS ER 20 MEQ PO TBCR
20.0000 meq | EXTENDED_RELEASE_TABLET | Freq: Two times a day (BID) | ORAL | Status: DC
  Administered 2016-12-01 – 2016-12-02 (×4): 20 meq via ORAL
  Filled 2016-12-01 (×4): qty 1

## 2016-12-01 MED ORDER — PANTOPRAZOLE SODIUM 40 MG IV SOLR
40.0000 mg | Freq: Two times a day (BID) | INTRAVENOUS | Status: DC
  Administered 2016-12-01 – 2016-12-02 (×4): 40 mg via INTRAVENOUS
  Filled 2016-12-01 (×4): qty 40

## 2016-12-01 NOTE — Consult Note (Signed)
Reason for Consult:  Obscure GI bleeding Referring Physician: [facility]  Shona Needles HPI: This is a 66 year old male with a PMH of CAD, HCF, NASH cirrhosis, and HTN with recurrent obscure GI bleeding.  He was last admitted in September with GI bleeding.  The patient was evaluated at [facility] with a spiral enterography, which was negative for any overt bleeding source.  A repeat capsule endoscopy was performed in the hospital and there was a lesion in the jejunum that was a potential source for bleeding.  He was sent back to [facility], but Dr. Heide Guile felt that the site identified in the small bowel was from his recent polypectomy.  Approximately 10-11 days ago he had a blood transfusion and he has also been receiving IV iron infusions.  His HGB dropped again into the 6 range and he also reported melena with a hint of hematochezia with his bowel movement last evening.  There is a burning sensation in his abdomen that resolves with a bowel movement.    Past Medical History:  Diagnosis Date  . AAA (abdominal aortic aneurysm) ([place]) 03/2014   3.2 cm on MR abd.  F/u aortic u/s 06/2014 showed 3.0 x 3.1 cm AAA: recheck 2 yrs recommended (followed by cardiologist).  Minimal growth on 10/2016 CT abd done for epig pain.  Repeat u/s 10/2018.  [name] Anemia   . Bilateral renal cysts    simple (03/2014 MRI)  . CAD (coronary artery disease)   . Cholelithiases 2018   asymptomatic  . Chronic diastolic heart failure ([place])   . Cirrhosis ([place]) 05/2016   secondary to NASH; most recent u/s abd 07/2016 showed splenomegaly.  Hx of portal HTN changes with mild ascites and splenomegaly.  . Diabetes mellitus with complication ([place]) 38/1017   A1c 6.8%  . History of blood transfusion 2018 X 4 dates   "low blood" (09/21/2016)  . Hyperlipemia, mixed    elevated LFTs when on statins.    . Hypertension    Cr bump 04/01/16 so I changed benicar-hct to benicar plain and added amlodipine 5 mg.  . Iron deficiency anemia 05/2016   Acute  blood loss anemia: hospitalized, required transfusion x 3 U: colonoscopy and capsule study unrevealing.  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  W/u for ? hemolytic anemia to be pursued by hematologist as outpt.  Appt with Dr. Cristi Loron at [facility] 09/13/16.  . Microscopic hematuria    Eval unremarkable by Dr. Eulogio Ditch.  [name] NASH (nonalcoholic steatohepatitis)    Fatty liver on MR abd 03/2014, + hx of elevated transaminases and bili: followed by Dr. [name].  . Obesity   . Spleen enlarged     Past Surgical History:  Procedure Laterality Date  . CARDIAC CATHETERIZATION    . CARDIOVASCULAR STRESS TEST  01/17/2012; 05/04/16   Normal stress nuclear study x 2 (2018--Normal perfusion. LVEF 66% with normal wall motion. This is a low risk study).  . CERVICAL DISCECTOMY  1992  . COLONOSCOPY N/A 05/27/2016   No site/explanation for blood loss found.  Erythematous mucosa in cecum and ascending colon--Cecal bx: normal.  Procedure: COLONOSCOPY;  Surgeon: Carol Ada, MD;  Location: [facility] ENDOSCOPY;  Service: Endoscopy;  Laterality: N/A;  . COLONOSCOPY W/ POLYPECTOMY  09/10/2014   Polypectomy x 2: recall 5 yrs (Dr. [name]).  . CORONARY ANGIOPLASTY    . CORONARY ARTERY BYPASS GRAFT  03/2006  . ENTEROSCOPY  N/A 07/31/2016   Procedure: ENTEROSCOPY;  Surgeon: Ladene Artist, MD;  Location: [facility] ENDOSCOPY;  Service: Endoscopy;  Laterality: N/A;  . ESOPHAGOGASTRODUODENOSCOPY N/A 05/25/2016   No site/explanation for blood loss found.  Procedure: ESOPHAGOGASTRODUODENOSCOPY (EGD);  Surgeon: Juanita Craver, MD;  Location: [facility] ENDOSCOPY;  Service: Endoscopy;  Laterality: N/A;  . ESOPHAGOGASTRODUODENOSCOPY N/A 09/23/2016   Procedure: ESOPHAGOGASTRODUODENOSCOPY (EGD);  Surgeon: Carol Ada, MD;  Location: [facility];  Service: Endoscopy;  Laterality: N/A;  . GIVENS CAPSULE STUDY N/A 05/27/2016   No source identified.  Repeat 10/2016--results  pending.  Procedure: GIVENS CAPSULE STUDY;  Surgeon: Carol Ada, MD;  Location: [facility] ENDOSCOPY;  Service: Endoscopy;  Laterality: N/A;  . GIVENS CAPSULE STUDY N/A 10/15/2016   Procedure: GIVENS CAPSULE STUDY;  Surgeon: Carol Ada, MD;  Location: [facility] ENDOSCOPY;  Service: Endoscopy;  Laterality: N/A;  . NECK SURGERY  1991  . SMALL BOWEL ENTEROSCOPY  10/2016   ([facility], Dr. [name]: Double balloon enteroscopy--to the level of proximal ileum) jejunal polyps- which were removed--not bleeding & benign.  No source of bleeding was identified.  . TRANSTHORACIC ECHOCARDIOGRAM  05/25/2016   EF 60%, normal wall motion, grd II DD, mild aortic stenosis, dilated aortic root and ascending aorta.  [name] VASECTOMY  1977    Family History  Problem Relation Age of Onset  . Hypertension Mother   . Cancer - Other Mother        liver cancer  . Heart disease Father   . Heart attack Father   . Cancer - Lung Father   . Diabetes Father   . Liver disease Sister   . Anemia Sister   . Heart disease Sister   . Heart attack Sister   . Heart disease Brother        CABG 20 YEARS AGO  . Hypertension Brother   . Mesothelioma Brother        HALF-BROTHER  . Cancer - Other Brother        CLL  . Cancer - Lung Brother        Mets to brain  . Cancer - Lung Sister        HALF-SISTER  . Liver disease Brother   . Heart disease Brother        CABG-2012  . Emphysema Maternal Grandfather   . Cancer - Other Paternal Grandmother        Stomach  . Heart attack Paternal Grandfather     Social History:  reports that he has quit smoking. His smoking use included Cigarettes. He has never used smokeless tobacco. He reports that he does not drink alcohol or use drugs.  Allergies:  Allergies  Allergen Reactions  . Statins Other (See Comments)    Liver enzymes go up whenever on them    Medications:  Scheduled: . metoprolol tartrate  12.5 mg Oral BID  . pantoprazole (PROTONIX) IV  40 mg Intravenous Q12H  . potassium chloride SA   20 mEq Oral BID  . [START ON 12/02/2016] rosuvastatin  5 mg Oral Q7 days  . spironolactone  25 mg Oral Daily  . torsemide  40 mg Oral Daily  . tranexamic acid  650 mg Oral TID   Continuous: . sodium chloride      Results for orders placed or performed during the hospital encounter of 11/30/16 (from the past 24 hour(s))  Prepare RBC     Status: None   Collection Time: 11/30/16  6:40 PM  Result Value Ref Range   Order Confirmation  ORDER PROCESSED BY BLOOD BANK   CBC     Status: Abnormal   Collection Time: 12/01/16  4:09 AM  Result Value Ref Range   WBC 4.9 4.0 - 10.5 K/uL   RBC 2.47 (L) 4.22 - 5.81 MIL/uL   Hemoglobin 6.7 (LL) 13.0 - 17.0 g/dL   HCT 20.8 (L) 39.0 - 52.0 %   MCV 84.2 78.0 - 100.0 fL   MCH 27.1 26.0 - 34.0 pg   MCHC 32.2 30.0 - 36.0 g/dL   RDW 16.9 (H) 11.5 - 15.5 %   Platelets 97 (L) 150 - 400 K/uL  Glucose, capillary     Status: Abnormal   Collection Time: 12/01/16  1:40 PM  Result Value Ref Range   Glucose-Capillary 127 (H) 65 - 99 mg/dL     No results found.  ROS:  As stated above in the HPI otherwise negative.  Blood pressure (!) 108/57, pulse 77, temperature 98 F (36.7 C), temperature source Oral, resp. rate 18, height 5' 8"  (1.727 m), weight 108 kg (238 lb), SpO2 97 %.    PE: Gen: NAD, Alert and Oriented HEENT:  [place]/AT, EOMI Neck: Supple, no LAD Lungs: CTA Bilaterally CV: RRR without M/G/R ABM: Soft, NTND, +BS Ext: No C/C/E  Assessment/Plan: 1) Obscure GI bleeding. 2) Anemia. 3) Cirrhosis.   I will repeat the enteroscopy to see if a GI source of bleeding can be identified.  He is hemodynamically stable at this point.  Plan: 1) Enteroscopy tomorrow. 2) Agree with the blood transfusion.  Kyoko Elsea D 12/01/2016, 3:16 PM

## 2016-12-01 NOTE — Progress Notes (Addendum)
[facility] PROGRESS NOTE    Progress Note  William [name]  WJX:[id number] DOB: 1950-09-09 DOA: 11/30/2016 PCP: Tammi Sou, MD     Brief Narrative:   William [name] is an 66 y.o. male past medical history of AAA, Karlene Lineman liver cirrhosis chronic diastolic heart failure, past medical history of multiple GI bleeds with multiple endoscopies to most recent one is Dr. Almyra Free who saw portal gastropathy, he had a recent spiral enterography technique without any obvious source of bleeding, during this time they did found comes into the ED with a hemoglobin of 6.  Assessment/Plan:   Chronic GI bleeding/  Iron deficiency anemia due to chronic blood loss: Anemia panel done showed a ferritin of 75, with a saturation of 6%. She was transfused 1 unit of packed red blood cells and her hemoglobin went from 6.9-6.7 we'll transfuse an additional 2 units of packed red blood cells GI was consulted recommended enteroscopy on 21/30/8657  Metabolic syndrome Non-insulin treated type 2 diabetes mellitus ([place]) Chronic Thrombocytopenia ([place]) All other medical problems are stable and will continue his current home regimen.  DVT prophylaxis: SCD Family Communication:none Disposition Plan/Barrier to D/C: home in 1-2 day Code Status:     Code Status Orders        Start     Ordered   11/30/16 1950  Full code  Continuous     11/30/16 1949    Code Status History    Date Active Date Inactive Code Status Order ID Comments User Context   10/14/2016  4:24 PM 10/16/2016  2:29 PM Full Code [id number]  Rosita Fire, MD ED   09/21/2016 12:57 PM 09/23/2016  6:02 PM Full Code [id number]  Waldemar Dickens, MD ED   08/30/2016  2:44 PM 09/03/2016  5:40 PM Full Code [id number]  Jani Gravel, MD Inpatient   07/29/2016 11:26 AM 08/01/2016  5:38 PM Full Code [id number]  Radene Gunning, NP ED   05/22/2016  9:22 AM 05/29/2016  3:14 PM Full Code [id number]  Elwin Mocha, MD ED        IV Access:    Peripheral  IV   Procedures and diagnostic studies:   No results found.   Medical Consultants:    None.  Anti-Infectives:   None  Subjective:    William [name] no new complaints no melanotic stools or bright red blood per rectum  Objective:    Vitals:   11/30/16 2234 11/30/16 2256 12/01/16 0124 12/01/16 0435  BP: 131/67 (!) 111/52 113/72 134/63  Pulse: 92 93 94 94  Resp: 16 16 18 20   Temp: 98.4 F (36.9 C) 98.9 F (37.2 C) 99 F (37.2 C) 98.7 F (37.1 C)  TempSrc: Oral Oral Oral Oral  SpO2: 98% 98% 97% 94%  Weight:      Height:        Intake/Output Summary (Last 24 hours) at 12/01/16 0828 Last data filed at 12/01/16 0124  Gross per 24 hour  Intake              340 ml  Output                0 ml  Net              340 ml   Filed Weights   11/30/16 1442 11/30/16 1800  Weight: 108 kg (238 lb) 108 kg (238 lb)    Exam: General exam: In no acute distress. Respiratory system: Good air movement clear  to auscultation. Cardiovascular system: Regular in rhythm with positive S1-S2. Gastrointestinal system: Abdomen is nondistended, soft and nontender.  Central nervous system: Alert and oriented. No focal neurological deficits. Extremities: No pedal edema. Skin: No rashes, lesions or ulcers Psychiatry: Judgement and insight appear normal. Mood & affect appropriate.    Data Reviewed:    Labs: Basic Metabolic Panel:  Recent Labs Lab 11/30/16 1451  NA 136  K 3.8  CL 105  CO2 23  GLUCOSE 101*  BUN 16  CREATININE 1.21  CALCIUM 8.4*   GFR Estimated Creatinine Clearance: 70.6 mL/min (by C-G formula based on SCr of 1.21 mg/dL). Liver Function Tests:  Recent Labs Lab 11/30/16 1451  AST 33  ALT 24  ALKPHOS 97  BILITOT 3.7*  PROT 6.2*  ALBUMIN 2.9*   No results for input(s): LIPASE, AMYLASE in the last 168 hours. No results for input(s): AMMONIA in the last 168 hours. Coagulation profile No results for input(s): INR, PROTIME in the last 168  hours.  CBC:  Recent Labs Lab 11/30/16 0822 11/30/16 1451 12/01/16 0409  WBC 4.7 6.1 4.9  NEUTROABS 2.6  --   --   HGB 6.9* 6.9* 6.7*  HCT 21.5* 21.2* 20.8*  MCV 83.0 84.8 84.2  PLT 113* 123* 97*   Cardiac Enzymes: No results for input(s): CKTOTAL, CKMB, CKMBINDEX, TROPONINI in the last 168 hours. BNP (last 3 results)  Recent Labs  07/28/16 1416  PROBNP 120   CBG: No results for input(s): GLUCAP in the last 168 hours. D-Dimer: No results for input(s): DDIMER in the last 72 hours. Hgb A1c: No results for input(s): HGBA1C in the last 72 hours. Lipid Profile: No results for input(s): CHOL, HDL, LDLCALC, TRIG, CHOLHDL, LDLDIRECT in the last 72 hours. Thyroid function studies: No results for input(s): TSH, T4TOTAL, T3FREE, THYROIDAB in the last 72 hours.  Invalid input(s): FREET3 Anemia work up:  Recent Labs  11/30/16 0822  RETICCTPCT 3.19*   Sepsis Labs:  Recent Labs Lab 11/30/16 0822 11/30/16 1451 12/01/16 0409  WBC 4.7 6.1 4.9   Microbiology No results found for this or any previous visit (from the past 240 hour(s)).   Medications:   . metoprolol tartrate  12.5 mg Oral BID  . pantoprazole (PROTONIX) IV  40 mg Intravenous Q12H  . spironolactone  25 mg Oral Daily  . torsemide  40 mg Oral Daily  . tranexamic acid  650 mg Oral TID   Continuous Infusions: . sodium chloride    . sodium chloride       LOS: 0 days   Charlynne Cousins  [facility] Pager [phone]  *Please refer to [email or web address], password [email or web address] to get updated schedule on who will round on this patient, as hospitalists switch teams weekly. If 7PM-7AM, please contact night-coverage at [email or web address], password [email or web address] for any overnight needs.  12/01/2016, 8:28 AM

## 2016-12-02 ENCOUNTER — Observation Stay (HOSPITAL_COMMUNITY): Payer: 59 | Admitting: Anesthesiology

## 2016-12-02 ENCOUNTER — Encounter (HOSPITAL_COMMUNITY)
Admission: EM | Disposition: A | Discharge status: Home / Self Care | Payer: Self-pay | Source: Home / Self Care | Attending: Physician Assistant

## 2016-12-02 ENCOUNTER — Encounter (HOSPITAL_COMMUNITY): Payer: Self-pay

## 2016-12-02 DIAGNOSIS — I8510 Secondary esophageal varices without bleeding: Secondary | ICD-10-CM | POA: Diagnosis not present

## 2016-12-02 DIAGNOSIS — K766 Portal hypertension: Secondary | ICD-10-CM | POA: Diagnosis not present

## 2016-12-02 DIAGNOSIS — K7460 Unspecified cirrhosis of liver: Secondary | ICD-10-CM | POA: Diagnosis not present

## 2016-12-02 DIAGNOSIS — I2510 Atherosclerotic heart disease of native coronary artery without angina pectoris: Secondary | ICD-10-CM | POA: Diagnosis not present

## 2016-12-02 DIAGNOSIS — D500 Iron deficiency anemia secondary to blood loss (chronic): Secondary | ICD-10-CM | POA: Diagnosis not present

## 2016-12-02 DIAGNOSIS — I130 Hypertensive heart and chronic kidney disease with heart failure and stage 1 through stage 4 chronic kidney disease, or unspecified chronic kidney disease: Secondary | ICD-10-CM | POA: Diagnosis not present

## 2016-12-02 DIAGNOSIS — D509 Iron deficiency anemia, unspecified: Secondary | ICD-10-CM | POA: Diagnosis not present

## 2016-12-02 DIAGNOSIS — K922 Gastrointestinal hemorrhage, unspecified: Secondary | ICD-10-CM | POA: Diagnosis not present

## 2016-12-02 DIAGNOSIS — I8500 Esophageal varices without bleeding: Secondary | ICD-10-CM | POA: Diagnosis not present

## 2016-12-02 DIAGNOSIS — D62 Acute posthemorrhagic anemia: Secondary | ICD-10-CM | POA: Diagnosis not present

## 2016-12-02 DIAGNOSIS — K7581 Nonalcoholic steatohepatitis (NASH): Secondary | ICD-10-CM | POA: Diagnosis not present

## 2016-12-02 DIAGNOSIS — K3189 Other diseases of stomach and duodenum: Secondary | ICD-10-CM | POA: Diagnosis not present

## 2016-12-02 DIAGNOSIS — I5032 Chronic diastolic (congestive) heart failure: Secondary | ICD-10-CM | POA: Diagnosis not present

## 2016-12-02 DIAGNOSIS — R195 Other fecal abnormalities: Secondary | ICD-10-CM | POA: Diagnosis not present

## 2016-12-02 HISTORY — PX: ENTEROSCOPY: SHX5533

## 2016-12-02 LAB — BPAM RBC
BLOOD PRODUCT EXPIRATION DATE: 201811232359
Blood Product Expiration Date: 201811232359
Blood Product Expiration Date: 201811232359
ISSUE DATE / TIME: 201810250945
ISSUE DATE / TIME: 201810242218
ISSUE DATE / TIME: 201810251410
UNIT TYPE AND RH: 5100
Unit Type and Rh: 5100
Unit Type and Rh: 5100

## 2016-12-02 LAB — TYPE AND SCREEN
ABO/RH(D): O POS
Antibody Screen: NEGATIVE
UNIT DIVISION: 0
Unit division: 0
Unit division: 0

## 2016-12-02 SURGERY — ENTEROSCOPY
Anesthesia: Monitor Anesthesia Care

## 2016-12-02 MED ORDER — SODIUM CHLORIDE 0.9 % IV SOLN
INTRAVENOUS | Status: DC
  Administered 2016-12-02: 10:00:00 via INTRAVENOUS

## 2016-12-02 MED ORDER — GLUCAGON HCL RDNA (DIAGNOSTIC) 1 MG IJ SOLR
INTRAMUSCULAR | Status: DC | PRN
  Administered 2016-12-02: 1 mg via INTRAVENOUS

## 2016-12-02 MED ORDER — PHENYLEPHRINE 40 MCG/ML (10ML) SYRINGE FOR IV PUSH (FOR BLOOD PRESSURE SUPPORT)
PREFILLED_SYRINGE | INTRAVENOUS | Status: DC | PRN
  Administered 2016-12-02: 20 ug via INTRAVENOUS

## 2016-12-02 MED ORDER — EPHEDRINE SULFATE-NACL 50-0.9 MG/10ML-% IV SOSY
PREFILLED_SYRINGE | INTRAVENOUS | Status: DC | PRN
  Administered 2016-12-02: 5 mg via INTRAVENOUS

## 2016-12-02 MED ORDER — PROPOFOL 500 MG/50ML IV EMUL
INTRAVENOUS | Status: DC | PRN
  Administered 2016-12-02: 100 ug/kg/min via INTRAVENOUS

## 2016-12-02 MED ORDER — LACTATED RINGERS IV SOLN: INTRAVENOUS | Status: DC | PRN

## 2016-12-02 MED ORDER — LIDOCAINE 2% (20 MG/ML) 5 ML SYRINGE
INTRAMUSCULAR | Status: DC | PRN
  Administered 2016-12-02: 100 mg via INTRAVENOUS

## 2016-12-02 MED ORDER — PROPOFOL 10 MG/ML IV BOLUS
INTRAVENOUS | Status: AC
  Filled 2016-12-02: qty 40

## 2016-12-02 MED ORDER — GLUCAGON HCL RDNA (DIAGNOSTIC) 1 MG IJ SOLR
INTRAMUSCULAR | Status: AC
  Filled 2016-12-02: qty 1

## 2016-12-02 MED ORDER — EPHEDRINE 5 MG/ML INJ
INTRAVENOUS | Status: AC
  Filled 2016-12-02: qty 10

## 2016-12-02 NOTE — Transfer of Care (Signed)
Immediate Anesthesia Transfer of Care Note  Patient: William [name]  Procedure(s) Performed: ENTEROSCOPY (N/A )  Patient Location: PACU and Endoscopy Unit  Anesthesia Type:MAC  Level of Consciousness: awake, alert , oriented and patient cooperative  Airway & Oxygen Therapy: Patient Spontanous Breathing and Patient connected to nasal cannula oxygen  Post-op Assessment: Report given to RN, Post -op Vital signs reviewed and stable and Patient moving all extremities  Post vital signs: Reviewed and stable  Last Vitals:  Vitals:   12/02/16 0629 12/02/16 1315  BP: (!) 112/51 (!) 116/48  Pulse: 82 70  Resp: 14 13  Temp: 36.7 C 36.8 C  SpO2: 95% 95%    Last Pain:  Vitals:   12/02/16 1315  TempSrc: Oral         Complications: No apparent anesthesia complications

## 2016-12-02 NOTE — Op Note (Signed)
[facility] Patient Name: William [name] Procedure Date: 12/02/2016 MRN: [id number] Attending MD: Carol Ada , MD Date of Birth: Jul 13, 1950 CSN: [id number] Age: 66 Admit Type: Inpatient Procedure:                Small bowel enteroscopy Indications:              GI bleeding source not documented by previous EGD                            and colonoscopy Providers:                Carol Ada, MD, Angus Seller, Elspeth Cho                            Tech., Technician, Courtney Heys. [place], CRNA Referring MD:              Medicines:                Propofol per Anesthesia Complications:            No immediate complications. Estimated Blood Loss:     Estimated blood loss: none. Procedure:                Pre-Anesthesia Assessment:                           - Prior to the procedure, a History and Physical                            was performed, and patient medications and                            allergies were reviewed. The patient's tolerance of                            previous anesthesia was also reviewed. The risks                            and benefits of the procedure and the sedation                            options and risks were discussed with the patient.                            All questions were answered, and informed consent                            was obtained. Prior Anticoagulants: The patient has                            taken no previous anticoagulant or antiplatelet                            agents. ASA Grade Assessment: III - A patient with  severe systemic disease. After reviewing the risks                            and benefits, the patient was deemed in                            satisfactory condition to undergo the procedure.                           - Sedation was administered by an anesthesia                            professional. Deep sedation was attained.                           After  obtaining informed consent, the endoscope was                            passed under direct vision. Throughout the                            procedure, the patient's blood pressure, pulse, and                            oxygen saturations were monitored continuously. The                            EC-[id number] ([id number]) scope was introduced through                            the mouth and advanced to the proximal jejunum. The                            small bowel enteroscopy was accomplished without                            difficulty. The patient tolerated the procedure                            well. Scope In: Scope Out: Findings:      There was no evidence of significant pathology in the entire examined       portion of jejunum.      The examined duodenum was normal.      Mild portal hypertensive gastropathy was found in the gastric fundus and       in the gastric body.      Small (< 5 mm) varices were found in the middle third of the esophagus.      It is possible that the bleeding is from the portal HTN gastropathy.       There was some mild fresh blood in the gastric lumen, but this was       extremely minor. No evidence of fundic varices. Impression:               - The examined portion of the jejunum was normal.                           -  Normal examined duodenum.                           - Portal hypertensive gastropathy.                           - Small (< 5 mm) esophageal varices.                           - No specimens collected. Recommendation:           - Return patient to hospital ward for ongoing care.                           - Resume regular diet.                           - Continue present medications.                           - If patient is having melena with the next                            admission, a bleeding scan needs to be performed. Procedure Code(s):        --- Professional ---                           [phone], Small intestinal endoscopy,  enteroscopy                            beyond second portion of duodenum, not including                            ileum; diagnostic, including collection of                            specimen(s) by brushing or washing, when performed                            (separate procedure) Diagnosis Code(s):        --- Professional ---                           K76.6, Portal hypertension                           K31.89, Other diseases of stomach and duodenum                           I85.00, Esophageal varices without bleeding                           K92.2, Gastrointestinal hemorrhage, unspecified CPT copyright 2016 American Medical Association. All rights reserved. The codes documented in this report are preliminary and upon coder review may  be revised to meet current compliance requirements. Carol Ada, MD Carol Ada, MD 12/02/2016 1:59:49 PM This report has been signed electronically. Number  of Addenda: 0

## 2016-12-02 NOTE — H&P (View-Only) (Signed)
Reason for Consult:  Obscure GI bleeding Referring Physician: [facility]  Shona Needles HPI: This is a 66 year old male with a PMH of CAD, HCF, NASH cirrhosis, and HTN with recurrent obscure GI bleeding.  He was last admitted in September with GI bleeding.  The patient was evaluated at [facility] with a spiral enterography, which was negative for any overt bleeding source.  A repeat capsule endoscopy was performed in the hospital and there was a lesion in the jejunum that was a potential source for bleeding.  He was sent back to [facility], but Dr. Heide Guile felt that the site identified in the small bowel was from his recent polypectomy.  Approximately 10-11 days ago he had a blood transfusion and he has also been receiving IV iron infusions.  His HGB dropped again into the 6 range and he also reported melena with a hint of hematochezia with his bowel movement last evening.  There is a burning sensation in his abdomen that resolves with a bowel movement.    Past Medical History:  Diagnosis Date  . AAA (abdominal aortic aneurysm) ([place]) 03/2014   3.2 cm on MR abd.  F/u aortic u/s 06/2014 showed 3.0 x 3.1 cm AAA: recheck 2 yrs recommended (followed by cardiologist).  Minimal growth on 10/2016 CT abd done for epig pain.  Repeat u/s 10/2018.  [name] Anemia   . Bilateral renal cysts    simple (03/2014 MRI)  . CAD (coronary artery disease)   . Cholelithiases 2018   asymptomatic  . Chronic diastolic heart failure ([place])   . Cirrhosis ([place]) 05/2016   secondary to NASH; most recent u/s abd 07/2016 showed splenomegaly.  Hx of portal HTN changes with mild ascites and splenomegaly.  . Diabetes mellitus with complication ([place]) 58/8502   A1c 6.8%  . History of blood transfusion 2018 X 4 dates   "low blood" (09/21/2016)  . Hyperlipemia, mixed    elevated LFTs when on statins.    . Hypertension    Cr bump 04/01/16 so I changed benicar-hct to benicar plain and added amlodipine 5 mg.  . Iron deficiency anemia 05/2016   Acute  blood loss anemia: hospitalized, required transfusion x 3 U: colonoscopy and capsule study unrevealing.  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  W/u for ? hemolytic anemia to be pursued by hematologist as outpt.  Appt with [facility]. Cristi Loron at [facility] 09/13/16.  . Microscopic hematuria    Eval unremarkable by Dr. Eulogio Ditch.  [name] NASH (nonalcoholic steatohepatitis)    Fatty liver on MR abd 03/2014, + hx of elevated transaminases and bili: followed by Dr. [name].  . Obesity   . Spleen enlarged     Past Surgical History:  Procedure Laterality Date  . CARDIAC CATHETERIZATION    . CARDIOVASCULAR STRESS TEST  01/17/2012; 05/04/16   Normal stress nuclear study x 2 (2018--Normal perfusion. LVEF 66% with normal wall motion. This is a low risk study).  . CERVICAL DISCECTOMY  1992  . COLONOSCOPY N/A 05/27/2016   No site/explanation for blood loss found.  Erythematous mucosa in cecum and ascending colon--Cecal bx: normal.  Procedure: COLONOSCOPY;  Surgeon: Carol Ada, MD;  Location: [facility] ENDOSCOPY;  Service: Endoscopy;  Laterality: N/A;  . COLONOSCOPY W/ POLYPECTOMY  09/10/2014   Polypectomy x 2: recall 5 yrs (Dr. [name]).  . CORONARY ANGIOPLASTY    . CORONARY ARTERY BYPASS GRAFT  03/2006  . ENTEROSCOPY  N/A 07/31/2016   Procedure: ENTEROSCOPY;  Surgeon: Ladene Artist, MD;  Location: [facility] ENDOSCOPY;  Service: Endoscopy;  Laterality: N/A;  . ESOPHAGOGASTRODUODENOSCOPY N/A 05/25/2016   No site/explanation for blood loss found.  Procedure: ESOPHAGOGASTRODUODENOSCOPY (EGD);  Surgeon: Juanita Craver, MD;  Location: [facility] ENDOSCOPY;  Service: Endoscopy;  Laterality: N/A;  . ESOPHAGOGASTRODUODENOSCOPY N/A 09/23/2016   Procedure: ESOPHAGOGASTRODUODENOSCOPY (EGD);  Surgeon: Carol Ada, MD;  Location: [place];  Service: Endoscopy;  Laterality: N/A;  . GIVENS CAPSULE STUDY N/A 05/27/2016   No source identified.  Repeat 10/2016--results  pending.  Procedure: GIVENS CAPSULE STUDY;  Surgeon: Carol Ada, MD;  Location: [facility] ENDOSCOPY;  Service: Endoscopy;  Laterality: N/A;  . GIVENS CAPSULE STUDY N/A 10/15/2016   Procedure: GIVENS CAPSULE STUDY;  Surgeon: Carol Ada, MD;  Location: [facility] ENDOSCOPY;  Service: Endoscopy;  Laterality: N/A;  . NECK SURGERY  1991  . SMALL BOWEL ENTEROSCOPY  10/2016   ([facility], Dr. [name]: Double balloon enteroscopy--to the level of proximal ileum) jejunal polyps- which were removed--not bleeding & benign.  No source of bleeding was identified.  . TRANSTHORACIC ECHOCARDIOGRAM  05/25/2016   EF 60%, normal wall motion, grd II DD, mild aortic stenosis, dilated aortic root and ascending aorta.  [name] VASECTOMY  1977    Family History  Problem Relation Age of Onset  . Hypertension Mother   . Cancer - Other Mother        liver cancer  . Heart disease Father   . Heart attack Father   . Cancer - Lung Father   . Diabetes Father   . Liver disease Sister   . Anemia Sister   . Heart disease Sister   . Heart attack Sister   . Heart disease Brother        CABG 20 YEARS AGO  . Hypertension Brother   . Mesothelioma Brother        HALF-BROTHER  . Cancer - Other Brother        CLL  . Cancer - Lung Brother        Mets to brain  . Cancer - Lung Sister        HALF-SISTER  . Liver disease Brother   . Heart disease Brother        CABG-2012  . Emphysema Maternal Grandfather   . Cancer - Other Paternal Grandmother        Stomach  . Heart attack Paternal Grandfather     Social History:  reports that he has quit smoking. His smoking use included Cigarettes. He has never used smokeless tobacco. He reports that he does not drink alcohol or use drugs.  Allergies:  Allergies  Allergen Reactions  . Statins Other (See Comments)    Liver enzymes go up whenever on them    Medications:  Scheduled: . metoprolol tartrate  12.5 mg Oral BID  . pantoprazole (PROTONIX) IV  40 mg Intravenous Q12H  . potassium chloride SA   20 mEq Oral BID  . [START ON 12/02/2016] rosuvastatin  5 mg Oral Q7 days  . spironolactone  25 mg Oral Daily  . torsemide  40 mg Oral Daily  . tranexamic acid  650 mg Oral TID   Continuous: . sodium chloride      Results for orders placed or performed during the hospital encounter of 11/30/16 (from the past 24 hour(s))  Prepare RBC     Status: None   Collection Time: 11/30/16  6:40 PM  Result Value Ref Range   Order Confirmation  ORDER PROCESSED BY BLOOD BANK   CBC     Status: Abnormal   Collection Time: 12/01/16  4:09 AM  Result Value Ref Range   WBC 4.9 4.0 - 10.5 K/uL   RBC 2.47 (L) 4.22 - 5.81 MIL/uL   Hemoglobin 6.7 (LL) 13.0 - 17.0 g/dL   HCT 20.8 (L) 39.0 - 52.0 %   MCV 84.2 78.0 - 100.0 fL   MCH 27.1 26.0 - 34.0 pg   MCHC 32.2 30.0 - 36.0 g/dL   RDW 16.9 (H) 11.5 - 15.5 %   Platelets 97 (L) 150 - 400 K/uL  Glucose, capillary     Status: Abnormal   Collection Time: 12/01/16  1:40 PM  Result Value Ref Range   Glucose-Capillary 127 (H) 65 - 99 mg/dL     No results found.  ROS:  As stated above in the HPI otherwise negative.  Blood pressure (!) 108/57, pulse 77, temperature 98 F (36.7 C), temperature source Oral, resp. rate 18, height 5' 8"  (1.727 m), weight 108 kg (238 lb), SpO2 97 %.    PE: Gen: NAD, Alert and Oriented HEENT:  [place]/AT, EOMI Neck: Supple, no LAD Lungs: CTA Bilaterally CV: RRR without M/G/R ABM: Soft, NTND, +BS Ext: No C/C/E  Assessment/Plan: 1) Obscure GI bleeding. 2) Anemia. 3) Cirrhosis.   I will repeat the enteroscopy to see if a GI source of bleeding can be identified.  He is hemodynamically stable at this point.  Plan: 1) Enteroscopy tomorrow. 2) Agree with the blood transfusion.  Lesslie Mckeehan D 12/01/2016, 3:16 PM

## 2016-12-02 NOTE — Progress Notes (Addendum)
[facility] PROGRESS NOTE    Progress Note  William [name]  VEH:[id number] DOB: 05-Nov-1950 DOA: 11/30/2016 PCP: Tammi Sou, MD     Brief Narrative:   William [name] is an 66 y.o. male past medical history of AAA, Karlene Lineman liver cirrhosis chronic diastolic heart failure, past medical history of multiple GI bleeds with multiple endoscopies to most recent one is Dr. Almyra Free who saw portal gastropathy, he had a recent spiral enterography technique without any obvious source of bleeding, during this time they did found comes into the ED with a hemoglobin of 6.  Assessment/Plan:   Chronic GI bleeding/  Iron deficiency anemia due to chronic blood loss: Anemia panel done showed a ferritin of 75, with a saturation of 6%. Status post 3 units of packed red blood cell hemoglobin is 8.7. GI was consulted recommended Endoscopy done on 12/02/2016 that shows no source of bleeding. Upon admission he comes in with melena can probably consider bleeding scan.  Metabolic syndrome Non-insulin treated type 2 diabetes mellitus ([place]) Chronic Thrombocytopenia ([place]) All other medical problems are stable and will continue his current home regimen.  DVT prophylaxis: SCD Family Communication:none Disposition Plan/Barrier to D/C: home in 1-2 day Code Status:     Code Status Orders        Start     Ordered   11/30/16 1950  Full code  Continuous     11/30/16 1949    Code Status History    Date Active Date Inactive Code Status Order ID Comments User Context   10/14/2016  4:24 PM 10/16/2016  2:29 PM Full Code [id number]  Rosita Fire, MD ED   09/21/2016 12:57 PM 09/23/2016  6:02 PM Full Code [id number]  Waldemar Dickens, MD ED   08/30/2016  2:44 PM 09/03/2016  5:40 PM Full Code [id number]  Jani Gravel, MD Inpatient   07/29/2016 11:26 AM 08/01/2016  5:38 PM Full Code [id number]  Radene Gunning, NP ED   05/22/2016  9:22 AM 05/29/2016  3:14 PM Full Code [id number]  Elwin Mocha, MD ED        IV  Access:    Peripheral IV   Procedures and diagnostic studies:   No results found.   Medical Consultants:    None.  Anti-Infectives:   None  Subjective:    William [name] denies any melanotic stools, with more energy than yesterday.  Objective:    Vitals:   12/01/16 1527 12/01/16 1715 12/01/16 2029 12/02/16 0629  BP: 113/60 (!) 104/55 (!) 101/50 (!) 112/51  Pulse: 75 77 99 82  Resp: 18 18 16 14   Temp: 98 F (36.7 C) 97.9 F (36.6 C) 98.3 F (36.8 C) 98 F (36.7 C)  TempSrc: Oral Oral Oral Oral  SpO2: 97% 97% 95% 95%  Weight:      Height:        Intake/Output Summary (Last 24 hours) at 12/02/16 0744 Last data filed at 12/02/16 0630  Gross per 24 hour  Intake            26713 ml  Output             2500 ml  Net            24213 ml   Filed Weights   11/30/16 1442 11/30/16 1800  Weight: 108 kg (238 lb) 108 kg (238 lb)    Exam: General exam: In no acute distress. Respiratory system: Good air movement clear to auscultation. Cardiovascular system:  Regular in rhythm with positive S1-S2. Gastrointestinal system: Abdomen is nondistended, soft and nontender.  Central nervous system: Alert and oriented. No focal neurological deficits. Extremities: No pedal edema. Skin: No rashes, lesions or ulcers Psychiatry: Judgement and insight appear normal. Mood & affect appropriate.    Data Reviewed:    Labs: Basic Metabolic Panel:  Recent Labs Lab 11/30/16 1451  NA 136  K 3.8  CL 105  CO2 23  GLUCOSE 101*  BUN 16  CREATININE 1.21  CALCIUM 8.4*   GFR Estimated Creatinine Clearance: 70.6 mL/min (by C-G formula based on SCr of 1.21 mg/dL). Liver Function Tests:  Recent Labs Lab 11/30/16 1451  AST 33  ALT 24  ALKPHOS 97  BILITOT 3.7*  PROT 6.2*  ALBUMIN 2.9*   No results for input(s): LIPASE, AMYLASE in the last 168 hours. No results for input(s): AMMONIA in the last 168 hours. Coagulation profile No results for input(s): INR, PROTIME in the  last 168 hours.  CBC:  Recent Labs Lab 11/30/16 0822 11/30/16 1451 12/01/16 0409 12/01/16 2003  WBC 4.7 6.1 4.9 4.9  NEUTROABS 2.6  --   --   --   HGB 6.9* 6.9* 6.7* 8.7*  HCT 21.5* 21.2* 20.8* 26.7*  MCV 83.0 84.8 84.2 84.5  PLT 113* 123* 97* 93*   Cardiac Enzymes: No results for input(s): CKTOTAL, CKMB, CKMBINDEX, TROPONINI in the last 168 hours. BNP (last 3 results)  Recent Labs  07/28/16 1416  PROBNP 120   CBG:  Recent Labs Lab 12/01/16 1340 12/01/16 1628  GLUCAP 127* 109*   D-Dimer: No results for input(s): DDIMER in the last 72 hours. Hgb A1c: No results for input(s): HGBA1C in the last 72 hours. Lipid Profile: No results for input(s): CHOL, HDL, LDLCALC, TRIG, CHOLHDL, LDLDIRECT in the last 72 hours. Thyroid function studies: No results for input(s): TSH, T4TOTAL, T3FREE, THYROIDAB in the last 72 hours.  Invalid input(s): FREET3 Anemia work up:  Recent Labs  11/30/16 0822  RETICCTPCT 3.19*   Sepsis Labs:  Recent Labs Lab 11/30/16 0822 11/30/16 1451 12/01/16 0409 12/01/16 2003  WBC 4.7 6.1 4.9 4.9   Microbiology No results found for this or any previous visit (from the past 240 hour(s)).   Medications:   . metoprolol tartrate  12.5 mg Oral BID  . pantoprazole (PROTONIX) IV  40 mg Intravenous Q12H  . potassium chloride SA  20 mEq Oral BID  . rosuvastatin  5 mg Oral Q7 days  . spironolactone  25 mg Oral Daily  . torsemide  40 mg Oral Daily  . tranexamic acid  650 mg Oral TID   Continuous Infusions: . sodium chloride       LOS: 0 days   Charlynne Cousins  [facility] Pager [phone]  *Please refer to [email or web address], password [email or web address] to get updated schedule on who will round on this patient, as hospitalists switch teams weekly. If 7PM-7AM, please contact night-coverage at [email or web address], password [email or web address] for any overnight needs.  12/02/2016, 7:44 AM

## 2016-12-02 NOTE — Interval H&P Note (Signed)
History and Physical Interval Note:  12/02/2016 1:25 PM  William [name]  has presented today for surgery, with the diagnosis of GI bleed  The various methods of treatment have been discussed with the patient and family. After consideration of risks, benefits and other options for treatment, the patient has consented to  Procedure(s): ENTEROSCOPY (N/A) as a surgical intervention .  The patient's history has been reviewed, patient examined, no change in status, stable for surgery.  I have reviewed the patient's chart and labs.  Questions were answered to the patient's satisfaction.     Calais Svehla D

## 2016-12-02 NOTE — Anesthesia Preprocedure Evaluation (Addendum)
Anesthesia Evaluation  Patient identified by MRN, date of birth, ID band Patient awake    Reviewed: Allergy & Precautions, H&P , NPO status , Patient's Chart, lab work & pertinent test results, reviewed documented beta blocker date and time   Airway Mallampati: I  TM Distance: >3 FB Neck ROM: full    Dental  (+) Teeth Intact, Dental Advidsory Given   Pulmonary former smoker,    breath sounds clear to auscultation       Cardiovascular Exercise Tolerance: Good hypertension, On Medications + CAD, + CABG (2008) and + Peripheral Vascular Disease (AAA)   Rhythm:Regular + Systolic murmurs HLD  TTE 2018 - LVEF 60-65%, mild LVH, normal wall motion, grade 2 DD, mild aortic stenosis - AVA around   1.8 cm2 based on LVOT diameter of 2.2 cm, 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 and ascending aorta to 4.2 cm, trivial MR, mild biatrial enlargement, mild TR, RVSP 28 mmHg  EKG - inc RBBB   Neuro/Psych negative neurological ROS  negative psych ROS   GI/Hepatic GERD  Controlled and Medicated,(+) Cirrhosis       , Hepatitis -  Endo/Other  diabetes, Well Controlled, Type 2  Renal/GU Renal disease (b/l cysts)     Musculoskeletal negative musculoskeletal ROS (+)   Abdominal   Peds  Hematology  (+) anemia ,   Anesthesia Other Findings   Reproductive/Obstetrics                            Anesthesia Physical  Anesthesia Plan  ASA: III  Anesthesia Plan: MAC   Post-op Pain Management:    Induction: Intravenous  PONV Risk Score and Plan: Propofol infusion and Treatment may vary due to age or medical condition  Airway Management Planned: Nasal Cannula  Additional Equipment: None  Intra-op Plan:   Post-operative Plan:   Informed Consent: I have reviewed the patients History and Physical, chart, labs and discussed the procedure including the risks, benefits and alternatives for the proposed anesthesia with the  patient or authorized representative who has indicated his/her understanding and acceptance.   Dental Advisory Given  Plan Discussed with: CRNA  Anesthesia Plan Comments:         Anesthesia Quick Evaluation                                   Anesthesia Evaluation  Patient identified by MRN, date of birth, ID band Patient awake    Reviewed: Allergy & Precautions, NPO status , Patient's Chart, lab work & pertinent test results  Airway Mallampati: III  TM Distance: >3 FB Neck ROM: Full    Dental no notable dental hx.    Pulmonary neg pulmonary ROS, former smoker,    Pulmonary exam normal breath sounds clear to auscultation       Cardiovascular hypertension, + CAD and + Peripheral Vascular Disease  negative cardio ROS Normal cardiovascular exam Rhythm:Regular Rate:Normal     Neuro/Psych negative neurological ROS  negative psych ROS   GI/Hepatic negative GI ROS, (+) Hepatitis -  Endo/Other  diabetes, Type 2Morbid obesity  Renal/GU Renal diseasenegative Renal ROS     Musculoskeletal negative musculoskeletal ROS (+)   Abdominal (+) + obese,   Peds  Hematology negative hematology ROS (+)   Anesthesia Other Findings   Reproductive/Obstetrics negative OB ROS  Anesthesia Physical Anesthesia Plan  ASA: III  Anesthesia Plan: MAC   Post-op Pain Management:    Induction: Intravenous  Airway Management Planned:   Additional Equipment:   Intra-op Plan:   Post-operative Plan:   Informed Consent: I have reviewed the patients History and Physical, chart, labs and discussed the procedure including the risks, benefits and alternatives for the proposed anesthesia with the patient or authorized representative who has indicated his/her understanding and acceptance.   Dental advisory given  Plan Discussed with: CRNA  Anesthesia Plan Comments:         Anesthesia Quick Evaluation

## 2016-12-03 DIAGNOSIS — I5032 Chronic diastolic (congestive) heart failure: Secondary | ICD-10-CM | POA: Diagnosis not present

## 2016-12-03 DIAGNOSIS — K922 Gastrointestinal hemorrhage, unspecified: Secondary | ICD-10-CM | POA: Diagnosis not present

## 2016-12-03 DIAGNOSIS — K7581 Nonalcoholic steatohepatitis (NASH): Secondary | ICD-10-CM | POA: Diagnosis not present

## 2016-12-03 DIAGNOSIS — I8510 Secondary esophageal varices without bleeding: Secondary | ICD-10-CM | POA: Diagnosis not present

## 2016-12-03 DIAGNOSIS — I2510 Atherosclerotic heart disease of native coronary artery without angina pectoris: Secondary | ICD-10-CM | POA: Diagnosis not present

## 2016-12-03 DIAGNOSIS — K766 Portal hypertension: Secondary | ICD-10-CM | POA: Diagnosis not present

## 2016-12-03 DIAGNOSIS — K7460 Unspecified cirrhosis of liver: Secondary | ICD-10-CM | POA: Diagnosis not present

## 2016-12-03 DIAGNOSIS — K3189 Other diseases of stomach and duodenum: Secondary | ICD-10-CM | POA: Diagnosis not present

## 2016-12-03 DIAGNOSIS — D62 Acute posthemorrhagic anemia: Secondary | ICD-10-CM | POA: Diagnosis not present

## 2016-12-03 DIAGNOSIS — D500 Iron deficiency anemia secondary to blood loss (chronic): Secondary | ICD-10-CM | POA: Diagnosis not present

## 2016-12-03 DIAGNOSIS — I130 Hypertensive heart and chronic kidney disease with heart failure and stage 1 through stage 4 chronic kidney disease, or unspecified chronic kidney disease: Secondary | ICD-10-CM | POA: Diagnosis not present

## 2016-12-03 NOTE — Discharge Summary (Signed)
Physician Discharge Summary  William [name] ZOX:[id number] DOB: 10-12-1950 DOA: 11/30/2016  PCP: Tammi Sou, MD  Admit date: 11/30/2016 Discharge date: 12/03/2016  Admitted From: home Disposition:  Home  Recommendations for Outpatient Follow-up:  1. Follow up with PCP in 1-2 weeks   Home Health:no Equipment/Devices:none  Discharge Condition:stable CODE STATUS:full Diet recommendation: Heart Healthy  Brief/Interim Summary: 67 y.o. male past medical history of AAA, Karlene Lineman liver cirrhosis chronic diastolic heart failure, past medical history of multiple GI bleeds with multiple endoscopies to most recent one is Dr. Almyra Free who saw portal gastropathy, he had a recent spiral enterography technique without any obvious source of bleeding, during this time they did found comes into the ED with a hemoglobin of 6.  Discharge Diagnoses:  Active Problems:   Metabolic syndrome   Mixed hyperlipidemia   Non-insulin treated type 2 diabetes mellitus ([facility])   Chronic diastolic CHF (congestive heart failure) ([facility])   Thrombocytopenia ([facility])   Symptomatic anemia   Acute blood loss anemia   Iron deficiency anemia due to chronic blood loss   Cirrhosis, nonalcoholic ([facility])   Portal hypertension ([facility])   Anemia   Chronic GI bleeding  Chronic GI bleed/iron deficiency anemia due to chronic blood loss: Anemia panel done on admission showed ferritin of 75 with saturation of 6%, on admission his hemoglobin 6.7  he status post 3 units of packed red blood cells , Improved today 0.7. GI was consulted recommended an endoscopy that showed  portal gastropathy with a small less than 5 mm esophageal varices specimen collected. GI recommended to follow-up with them as an outpatient.   All other medical problems have been stable.  Discharge Instructions  Discharge Instructions    Diet - low sodium heart healthy    Complete by:  As directed    Increase activity slowly    Complete by:  As directed       Allergies as of 12/03/2016      Reactions   Statins Other (See Comments)   Liver enzymes go up whenever on them      Medication List    TAKE these medications   acetaminophen 325 MG tablet Commonly known as:  TYLENOL Take 650 mg by mouth every 6 (six) hours as needed for fever.   cholecalciferol 1000 units tablet Commonly known as:  VITAMIN D Take 1,000 Units by mouth daily.   docusate sodium 100 MG capsule Commonly known as:  COLACE Take 100 mg by mouth 2 (two) times daily as needed for mild constipation (1 - 2 tabs as needed).   Evolocumab 140 MG/ML Soaj Commonly known as:  REPATHA SURECLICK Inject 811 mg into the skin every 14 (fourteen) days.   fluticasone 50 MCG/ACT nasal spray Commonly known as:  FLONASE Place 2 sprays into both nostrils daily. What changed:  when to take this  reasons to take this   metoprolol tartrate 25 MG tablet Commonly known as:  LOPRESSOR Take 0.5 tablets (12.5 mg total) by mouth 2 (two) times daily.   omega-3 acid ethyl esters 1 g capsule Commonly known as:  LOVAZA TAKE 2 CAPSULES BY MOUTH ONCE DAILY What changed:  See the new instructions.   ondansetron 4 MG tablet Commonly known as:  ZOFRAN Take 1 tablet (4 mg total) by mouth every 8 (eight) hours as needed for nausea or vomiting.   pantoprazole 40 MG tablet Commonly known as:  PROTONIX Take 1 tablet (40 mg total) by mouth 2 (two) times daily before a meal.  potassium chloride SA 20 MEQ tablet Commonly known as:  K-DUR,KLOR-CON Take 1 tablet (20 mEq total) by mouth 2 (two) times daily.   rosuvastatin 5 MG tablet Commonly known as:  CRESTOR Take 1 tablet by mouth 1-2 times per week as tolerated   spironolactone 25 MG tablet Commonly known as:  ALDACTONE Take 1 tablet (25 mg total) by mouth daily.   torsemide 20 MG tablet Commonly known as:  DEMADEX Take 2 tablets (40 mg total) by mouth daily.   tranexamic acid 650 MG Tabs tablet Commonly known as:  LYSTEDA Take  1 tablet (650 mg total) by mouth 3 (three) times daily.       Allergies  Allergen Reactions  . Statins Other (See Comments)    Liver enzymes go up whenever on them    Consultations: Gastroententerology  Procedures/Studies:  No results found. Endoscopy that showed portal gastropathy  Subjective: No new complaints feels great wants to go home.  Discharge Exam: Vitals:   12/02/16 2008 12/03/16 0607  BP: 119/62 (!) 96/58  Pulse: 80 71  Resp: 16 16  Temp: 98 F (36.7 C) 97.9 F (36.6 C)  SpO2: 97% 95%   Vitals:   12/02/16 1415 12/02/16 1500 12/02/16 2008 12/03/16 0607  BP:  105/64 119/62 (!) 96/58  Pulse: 68 64 80 71  Resp: 10 18 16 16   Temp:  97.9 F (36.6 C) 98 F (36.7 C) 97.9 F (36.6 C)  TempSrc:  Oral Oral Oral  SpO2: 98% 100% 97% 95%  Weight:      Height:        General: Pt is alert, awake, not in acute distress Cardiovascular: RRR, S1/S2 +, no rubs, no gallops Respiratory: CTA bilaterally, no wheezing, no rhonchi Abdominal: Soft, NT, ND, bowel sounds + Extremities: no edema, no cyanosis    The results of significant diagnostics from this hospitalization (including imaging, microbiology, ancillary and laboratory) are listed below for reference.     Microbiology: No results found for this or any previous visit (from the past 240 hour(s)).   Labs: BNP (last 3 results)  Recent Labs  05/04/16 1012 05/22/16 0713 05/26/16 0307  BNP 574.6* 765.6* 295.6*   Basic Metabolic Panel:  Recent Labs Lab 11/30/16 1451  NA 136  K 3.8  CL 105  CO2 23  GLUCOSE 101*  BUN 16  CREATININE 1.21  CALCIUM 8.4*   Liver Function Tests:  Recent Labs Lab 11/30/16 1451  AST 33  ALT 24  ALKPHOS 97  BILITOT 3.7*  PROT 6.2*  ALBUMIN 2.9*   No results for input(s): LIPASE, AMYLASE in the last 168 hours. No results for input(s): AMMONIA in the last 168 hours. CBC:  Recent Labs Lab 11/30/16 0822 11/30/16 1451 12/01/16 0409 12/01/16 2003  WBC 4.7  6.1 4.9 4.9  NEUTROABS 2.6  --   --   --   HGB 6.9* 6.9* 6.7* 8.7*  HCT 21.5* 21.2* 20.8* 26.7*  MCV 83.0 84.8 84.2 84.5  PLT 113* 123* 97* 93*   Cardiac Enzymes: No results for input(s): CKTOTAL, CKMB, CKMBINDEX, TROPONINI in the last 168 hours. BNP: Invalid input(s): POCBNP CBG:  Recent Labs Lab 12/01/16 1340 12/01/16 1628  GLUCAP 127* 109*   D-Dimer No results for input(s): DDIMER in the last 72 hours. Hgb A1c No results for input(s): HGBA1C in the last 72 hours. Lipid Profile No results for input(s): CHOL, HDL, LDLCALC, TRIG, CHOLHDL, LDLDIRECT in the last 72 hours. Thyroid function studies No results for input(s):  TSH, T4TOTAL, T3FREE, THYROIDAB in the last 72 hours.  Invalid input(s): FREET3 Anemia work up  Recent Labs  11/30/16 0822  RETICCTPCT 3.19*   Urinalysis    Component Value Date/Time   COLORURINE AMBER (A) 11/01/2016 1343   APPEARANCEUR CLEAR 11/01/2016 1343   LABSPEC 1.023 11/01/2016 1343   PHURINE 5.0 11/01/2016 1343   GLUCOSEU NEGATIVE 11/01/2016 1343   HGBUR NEGATIVE 11/01/2016 1343   BILIRUBINUR NEGATIVE 11/01/2016 1343   KETONESUR NEGATIVE 11/01/2016 1343   PROTEINUR NEGATIVE 11/01/2016 1343   NITRITE NEGATIVE 11/01/2016 1343   LEUKOCYTESUR NEGATIVE 11/01/2016 1343   Sepsis Labs Invalid input(s): PROCALCITONIN,  WBC,  LACTICIDVEN Microbiology No results found for this or any previous visit (from the past 240 hour(s)).   Time coordinating discharge: Over 30 minutes  SIGNED:   Charlynne Cousins, MD  [facility] 12/03/2016, 7:34 AM Pager   If 7PM-7AM, please contact night-coverage [email or web address] Password [email or web address]

## 2016-12-04 NOTE — Anesthesia Postprocedure Evaluation (Signed)
Anesthesia Post Note  Patient: William [name]  Procedure(s) Performed: ENTEROSCOPY (N/A )     Patient location during evaluation: PACU Anesthesia Type: MAC Level of consciousness: awake and alert Pain management: pain level controlled Vital Signs Assessment: post-procedure vital signs reviewed and stable Respiratory status: spontaneous breathing, nonlabored ventilation and respiratory function stable Cardiovascular status: stable and blood pressure returned to baseline Anesthetic complications: no    Last Vitals:  Vitals:   12/02/16 2008 12/03/16 0607  BP: 119/62 (!) 96/58  Pulse: 80 71  Resp: 16 16  Temp: 36.7 C 36.6 C  SpO2: 97% 95%    Last Pain:  Vitals:   12/03/16 7209  TempSrc: Oral                 Audry Pili

## 2016-12-05 ENCOUNTER — Encounter (HOSPITAL_COMMUNITY): Payer: Self-pay | Admitting: Gastroenterology

## 2016-12-06 ENCOUNTER — Telehealth: Payer: Self-pay

## 2016-12-06 NOTE — Telephone Encounter (Signed)
Unable to reach patient at time of TCM Call. Left message for patient to return call when available.  

## 2016-12-07 NOTE — Telephone Encounter (Signed)
LM requesting call back to complete TCM call and schedule hospital follow up.

## 2016-12-09 NOTE — Telephone Encounter (Signed)
Third attempt to reach patient to complete TCM and schedule hospital f/u appt.

## 2016-12-12 NOTE — Progress Notes (Signed)
HEMATOLOGY/ONCOLOGY CLINIC NOTE  Date of Service: 12/14/16  Patient Care Team: William Sou, MD as PCP - General (Family Medicine) William Craver, MD as Consulting Physician (Gastroenterology) William Gallo, MD as Consulting Physician (Urology) William Sine, MD as Consulting Physician (Cardiology) William Genera, MD as Consulting Physician (Hematology)  CHIEF COMPLAINTS/PURPOSE OF CONSULTATION:  Chronic blood loss Anemia from GI bleeding.  HISTORY OF PRESENTING ILLNESS:   William [name] is a wonderful 66 y.o. male who has been referred to [place] by Dr .William [name], William Blackwater, MD for evaluation and management of Anemia.  Patient has a history of extensive medical comorbidities including liver cirrhosis related to [facility] with portal hypertension, esophageal varices and portal hypertensive gastropathy and splenomegaly, iron deficiency anemia, obesity, diabetes, AAA.  Patient was admitted in April 2018 with acute blood loss anemia and required transfusion of multiple units of PRBCs with iron profile suggestive of iron deficiency. No overt evidence of hemolysis noted. He had an EGD and colonoscopy that did not show any overt bleeding. Capsule endoscopy was unrevealing but the capsule past and only 27 minutes.  He follows with William [name] who is his gastric oncologist.  Patient was again readmitted in June 2018 with symptomatically anemia and hemoglobin of 6.3. He underwent 4 units of PRBC transfusions again and was sent out on PPI and iron supplementation with the plan to follow-up with William [name].  He was also given hematology referral. He had a repeat ultrasound of the abdomen on 07/29/2016 which showed significant increase in splenomegaly in 2 months suggesting significant portal hypertension versus some element of splenic sequestration.  He continues to note intermittent black stools.  Hemoglobin is stable and improved today and is up to 11.1. Patient notes he feels better  after his transfusions. Has not noted lower GI bleeding at this time. We discuss any other workup to rule out less likely other possibilities of his anemia.  INTERVAL HISTORY  Patient is here for f/u of his anemia that is primarily related to ongoing issues with GI bleeding. Of note since his last visit, he presented to ED on 11/30/2016 for low hemoglobin of 6.9. He recently had a spiral enterography at [facility] without any obvious source of bleeding. Last week he underwent an endoscopy/enterogscopy and was found to have possible AVM's. He had a small bowel endoscopy done on 12/02/2016 with results showing: impression- The examined portion of the jejunum was normal. - Normal examined duodenum. - Portal hypertensive gastropathy. - Small (< 5 mm) esophageal varices. - No specimens collected. While admitted at ED he received two blood transfusions.   He reports to the office today accompanied by his wife. He states that he is doing well overall. He is steadily taking Lysteda. Will increase dose today and pt will now take 2 pills 2x daily. Hgb is 7.9 today. He is now seeing William [name] from GI as needed.  He notes that he has been bruising less since being on lysteda.  On review of systems, pt reports bloody stools and denies abdominal pain, fever, chills, night sweats and any other accompanying symptoms.    MEDICAL HISTORY:  Past Medical History:  Diagnosis Date  . AAA (abdominal aortic aneurysm) ([place]) 03/2014   3.2 cm on MR abd.  F/u aortic u/s 06/2014 showed 3.0 x 3.1 cm AAA: recheck 2 yrs recommended (followed by cardiologist).  Minimal growth on 10/2016 CT abd done for epig pain.  Repeat u/s 10/2018.  [name] Anemia   .  Bilateral renal cysts    simple (03/2014 MRI)  . CAD (coronary artery disease)   . Cholelithiases 2018   asymptomatic  . Chronic diastolic heart failure ([place])   . Cirrhosis ([place]) 05/2016   secondary to NASH; most recent u/s abd 07/2016 showed splenomegaly.  Hx of portal HTN  changes with mild ascites and splenomegaly.  . Diabetes mellitus with complication ([place]) 89/2119   A1c 6.8%  . History of blood transfusion 2018 X 4 dates   "low blood" (09/21/2016)  . Hyperlipemia, mixed    elevated LFTs when on statins.    . Hypertension    Cr bump 04/01/16 so I changed benicar-hct to benicar plain and added amlodipine 5 mg.  . Iron deficiency anemia 05/2016   Acute blood loss anemia: hospitalized, required transfusion x 3 U: colonoscopy and capsule study unrevealing.  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  W/u for ? hemolytic anemia to be pursued by hematologist as outpt.  Appt with William [name] at [facility] 09/13/16.  . Microscopic hematuria    Eval unremarkable by William [name].  [name] NASH (nonalcoholic steatohepatitis)    Fatty liver on MR abd 03/2014, + hx of elevated transaminases and bili: followed by William [name].  . Obesity   . Spleen enlarged     SURGICAL HISTORY: Past Surgical History:  Procedure Laterality Date  . CARDIAC CATHETERIZATION    . CARDIOVASCULAR STRESS TEST  01/17/2012; 05/04/16   Normal stress nuclear study x 2 (2018--Normal perfusion. LVEF 66% with normal wall motion. This is a low risk study).  . CERVICAL DISCECTOMY  1992  . COLONOSCOPY W/ POLYPECTOMY  09/10/2014   Polypectomy x 2: recall 5 yrs (William [name]).  . CORONARY ANGIOPLASTY    . CORONARY ARTERY BYPASS GRAFT  03/2006  . NECK SURGERY  1991  . SMALL BOWEL ENTEROSCOPY  10/2016   ([facility], William [name]: Double balloon enteroscopy--to the level of proximal ileum) jejunal polyps- which were removed--not bleeding & benign.  No source of bleeding was identified.  . TRANSTHORACIC ECHOCARDIOGRAM  05/25/2016   EF 60%, normal wall motion, grd II DD, mild aortic stenosis, dilated aortic root and ascending aorta.  [name] VASECTOMY  1977    SOCIAL HISTORY: Social History   Socioeconomic History  . Marital status: Married      Spouse name: Not on file  . Number of children: Not on file  . Years of education: Not on file  . Highest education level: Not on file  Social Needs  . Financial resource strain: Not on file  . Food insecurity - worry: Not on file  . Food insecurity - inability: Not on file  . Transportation needs - medical: Not on file  . Transportation needs - non-medical: Not on file  Occupational History  . Not on file  Tobacco Use  . Smoking status: Former Smoker    Types: Cigarettes  . Smokeless tobacco: Never Used  . Tobacco comment: QUIT I 1983  Substance and Sexual Activity  . Alcohol use: No  . Drug use: No  . Sexual activity: Yes  Other Topics Concern  . Not on file  Social History Narrative   Married, 3 grown children, 3 GCs.   Educ: 10th grade.   Occupation: Retired Brewing technologist.   Tob: quit 1983, smoked about 20 pack-yr hx prior.   No alcohol.    FAMILY HISTORY: Family History  Problem Relation  Age of Onset  . Hypertension Mother   . Cancer - Other Mother        liver cancer  . Heart disease Father   . Heart attack Father   . Cancer - Lung Father   . Diabetes Father   . Liver disease Sister   . Anemia Sister   . Heart disease Sister   . Heart attack Sister   . Heart disease Brother        CABG 20 YEARS AGO  . Hypertension Brother   . Mesothelioma Brother        HALF-BROTHER  . Cancer - Other Brother        CLL  . Cancer - Lung Brother        Mets to brain  . Cancer - Lung Sister        HALF-SISTER  . Liver disease Brother   . Heart disease Brother        CABG-2012  . Emphysema Maternal Grandfather   . Cancer - Other Paternal Grandmother        Stomach  . Heart attack Paternal Grandfather     ALLERGIES:  is allergic to statins.  MEDICATIONS:  Current Outpatient Medications  Medication Sig Dispense Refill  . acetaminophen (TYLENOL) 325 MG tablet Take 650 mg by mouth every 6 (six) hours as needed for fever.    . cholecalciferol (VITAMIN D) 1000  units tablet Take 1,000 Units by mouth daily.    [name] docusate sodium (COLACE) 100 MG capsule Take 100 mg by mouth 2 (two) times daily as needed for mild constipation (1 - 2 tabs as needed).    . fluticasone (FLONASE) 50 MCG/ACT nasal spray Place 2 sprays into both nostrils daily. (Patient taking differently: Place 2 sprays into both nostrils daily as needed for allergies. ) 16 g 6  . metoprolol tartrate (LOPRESSOR) 25 MG tablet Take 0.5 tablets (12.5 mg total) by mouth 2 (two) times daily. 60 tablet 6  . omega-3 acid ethyl esters (LOVAZA) 1 g capsule TAKE 2 CAPSULES BY MOUTH ONCE DAILY (Patient taking differently: TAKE 2 CAPSULES BY MOUTH DAILY) 180 capsule 3  . ondansetron (ZOFRAN) 4 MG tablet Take 1 tablet (4 mg total) by mouth every 8 (eight) hours as needed for nausea or vomiting. 10 tablet 0  . pantoprazole (PROTONIX) 40 MG tablet Take 1 tablet (40 mg total) by mouth 2 (two) times daily before a meal. 60 tablet 3  . potassium chloride SA (K-DUR,KLOR-CON) 20 MEQ tablet Take 1 tablet (20 mEq total) by mouth 2 (two) times daily. 60 tablet 6  . rosuvastatin (CRESTOR) 5 MG tablet Take 1 tablet by mouth 1-2 times per week as tolerated 24 tablet 3  . spironolactone (ALDACTONE) 25 MG tablet Take 1 tablet (25 mg total) by mouth daily. 30 tablet 6  . torsemide (DEMADEX) 20 MG tablet Take 2 tablets (40 mg total) by mouth daily. 60 tablet 6  . tranexamic acid (LYSTEDA) 650 MG TABS tablet Take 1 tablet (650 mg total) by mouth 3 (three) times daily. 90 tablet 1   No current facility-administered medications for this visit.     REVIEW OF SYSTEMS:    10 Point review of Systems was done is negative except as noted above.  PHYSICAL EXAMINATION:  ECOG PERFORMANCE STATUS: 1 - Symptomatic but completely ambulatory  . Vitals:   12/14/16 0933  BP: (!) 135/58  Pulse: 83  Resp: 18  Temp: 97.7 F (36.5 C)  SpO2: 96%  Filed Weights   12/14/16 0933  Weight: 243 lb 12.8 oz (110.6 kg)   .Body mass index  is 37.07 kg/m.  GENERAL:alert, in no acute distress and comfortable SKIN: no acute rashes, no significant lesions EYES: conjunctiva are pink and non-injected, sclera anicteric OROPHARYNX: MMM, no exudates, no oropharyngeal erythema or ulceration NECK: supple, no JVD LYMPH:  no palpable lymphadenopathy in the cervical, axillary or inguinal regions LUNGS: clear to auscultation b/l with normal respiratory effort HEART: regular rate & rhythm ABDOMEN:  normoactive bowel sounds , non tender, not distended. Extremity: Trace pedal edema bilaterally.  PSYCH: alert & oriented x 3 with fluent speech NEURO: no focal motor/sensory deficits  LABORATORY DATA:  I have reviewed the data as listed  . CBC Latest Ref Rng & Units 12/14/2016 12/01/2016 12/01/2016  WBC 4.0 - 10.3 10e3/uL 5.0 4.9 4.9  Hemoglobin 13.0 - 17.1 g/dL 7.9(L) 8.7(L) 6.7(LL)  Hematocrit 38.4 - 49.9 % 25.5(L) 26.7(L) 20.8(L)  Platelets 140 - 400 10e3/uL 119(L) 93(L) 97(L)    . CMP Latest Ref Rng & Units 12/14/2016 11/30/2016 11/18/2016  Glucose 70 - 140 mg/dl 108 101(H) 102  BUN 7.0 - 26.0 mg/dL 14.4 16 12.3  Creatinine 0.7 - 1.3 mg/dL 1.2 1.21 1.1  Sodium 136 - 145 mEq/L 134(L) 136 135(L)  Potassium 3.5 - 5.1 mEq/L 4.2 3.8 4.1  Chloride 101 - 111 mmol/L - 105 -  CO2 22 - 29 mEq/L 22 23 23   Calcium 8.4 - 10.4 mg/dL 8.8 8.4(L) 8.5  Total Protein 6.4 - 8.3 g/dL 6.4 6.2(L) 6.3(L)  Total Bilirubin 0.20 - 1.20 mg/dL 3.68(HH) 3.7(H) 3.66(HH)  Alkaline Phos 40 - 150 U/L 98 97 80  AST 5 - 34 U/L 34 33 29  ALT 0 - 55 U/L 25 24 20     . Lab Results  Component Value Date   IRON 21 (L) 11/16/2016   TIBC 353 11/16/2016   IRONPCTSAT 6 (L) 11/16/2016   (Iron and TIBC)  Lab Results  Component Value Date   FERRITIN 17 (L) 12/14/2016    RADIOGRAPHIC STUDIES: I have personally reviewed the radiological images as listed and agreed with the findings in the report. No results found.  ASSESSMENT & PLAN:   66 year old male with  multiple medical comorbidities with Karlene Lineman with liver cirrhosis with portal hypertension, esophageal varices and portal hypertensive gastropathy with  #1 Acute on Chronic blood loss anemia.  Patient has had recurrent GI bleeding requiring nearly 20 units of PRBCs. Between April 2018 and Aug 2018 and continues to have ongoing GI bleeding. Previous workup showed LDH within normal limits at 220 and suggests against overt hemolysis. Sedimentation rate within normal limits. Coombs' test is negative. Myeloma panel and serum free light chains suggest no monoclonal paraproteinemia. PNH testing negative Slightly lower haptoglobin levels can be related to decreased hepatic production, low-level hemolysis due to transfusions or extravascular hemolysis due to splenic or hepatic sequestration.  small bowel endoscopy done on 12/02/2016: impression - The examined portion of the jejunum was normal.  - Normal examined duodenum. - Portal hypertensive gastropathy.  -Small (< 5 mm) esophageal varices. - No specimens collected.  PLAN -we discussed that he continues to have ongoing GI bleeding  -he has had extensive GI evaluation with no source evidence of controllable at this time. -PRBC transfusion 2 units in 2-3 days for symptomatic anemia - IV Injectafer q4 weeks x 4 doses as ordered -will increase dose of lysteda 1333m po 2x daily -Will add Sucralfate today  -recommended  pt discuss switching metroprolol to non specific beta blocker and optimizing dose to address portal hypertension with PCP for possible decrease of bleeding  -Instructed pt  to immediately report to ED for red blood cell tag scans if experiences black stools or other signs of overt bleeding.  -set up transfusion 2u PRBC Monday 12/19/2016 -Continue IV injectafer  RTC in 1 month with Dr Irene Limbo with labs   All of the patients questions were answered with apparent satisfaction. The patient knows to call the clinic with any problems, questions  or concerns.  I spent 20 minutes counseling the patient face to face. The total time spent in the appointment was 25 minutes and more than 50% was on counseling and direct patient cares and co-ordination and arranging his transfer to the ED    Sullivan Lone MD [place] [facility] Hematology/Oncology Physician [place]  (Office):       [phone] (Work cell):  [phone] (Fax):           [phone]  This document serves as a record of services personally performed by Sullivan Lone, MD. It was created on his behalf by Alean Rinne, a trained medical scribe. The creation of this record is based on the scribe's personal observations and the provider's statements to them.   .I have reviewed the above documentation for accuracy and completeness, and I agree with the above. William Genera MD

## 2016-12-14 ENCOUNTER — Encounter: Payer: Self-pay | Admitting: Hematology

## 2016-12-14 ENCOUNTER — Other Ambulatory Visit: Payer: Self-pay

## 2016-12-14 ENCOUNTER — Ambulatory Visit (HOSPITAL_BASED_OUTPATIENT_CLINIC_OR_DEPARTMENT_OTHER): Payer: 59 | Admitting: Hematology

## 2016-12-14 ENCOUNTER — Other Ambulatory Visit (HOSPITAL_BASED_OUTPATIENT_CLINIC_OR_DEPARTMENT_OTHER): Payer: 59

## 2016-12-14 ENCOUNTER — Ambulatory Visit (HOSPITAL_BASED_OUTPATIENT_CLINIC_OR_DEPARTMENT_OTHER): Payer: 59

## 2016-12-14 VITALS — BP 113/52 | HR 72 | Temp 98.0°F | Resp 16

## 2016-12-14 VITALS — BP 135/58 | HR 83 | Temp 97.7°F | Resp 18 | Ht 68.0 in | Wt 243.8 lb

## 2016-12-14 DIAGNOSIS — K766 Portal hypertension: Secondary | ICD-10-CM

## 2016-12-14 DIAGNOSIS — K922 Gastrointestinal hemorrhage, unspecified: Secondary | ICD-10-CM

## 2016-12-14 DIAGNOSIS — Z8719 Personal history of other diseases of the digestive system: Secondary | ICD-10-CM

## 2016-12-14 DIAGNOSIS — D689 Coagulation defect, unspecified: Secondary | ICD-10-CM

## 2016-12-14 DIAGNOSIS — K7581 Nonalcoholic steatohepatitis (NASH): Secondary | ICD-10-CM

## 2016-12-14 DIAGNOSIS — D500 Iron deficiency anemia secondary to blood loss (chronic): Secondary | ICD-10-CM

## 2016-12-14 DIAGNOSIS — D62 Acute posthemorrhagic anemia: Secondary | ICD-10-CM

## 2016-12-14 DIAGNOSIS — K921 Melena: Secondary | ICD-10-CM

## 2016-12-14 DIAGNOSIS — D649 Anemia, unspecified: Secondary | ICD-10-CM

## 2016-12-14 LAB — CBC & DIFF AND RETIC
BASO%: 1.4 % (ref 0.0–2.0)
BASOS ABS: 0.1 10*3/uL (ref 0.0–0.1)
EOS ABS: 0.4 10*3/uL (ref 0.0–0.5)
EOS%: 8.7 % — AB (ref 0.0–7.0)
HEMATOCRIT: 25.5 % — AB (ref 38.4–49.9)
HEMOGLOBIN: 7.9 g/dL — AB (ref 13.0–17.1)
Immature Retic Fract: 10.7 % — ABNORMAL HIGH (ref 3.00–10.60)
LYMPH%: 20.7 % (ref 14.0–49.0)
MCH: 26.1 pg — AB (ref 27.2–33.4)
MCHC: 31 g/dL — AB (ref 32.0–36.0)
MCV: 84.2 fL (ref 79.3–98.0)
MONO#: 0.8 10*3/uL (ref 0.1–0.9)
MONO%: 16.3 % — AB (ref 0.0–14.0)
NEUT#: 2.7 10*3/uL (ref 1.5–6.5)
NEUT%: 52.9 % (ref 39.0–75.0)
Platelets: 119 10*3/uL — ABNORMAL LOW (ref 140–400)
RBC: 3.03 10*6/uL — ABNORMAL LOW (ref 4.20–5.82)
RDW: 17.1 % — ABNORMAL HIGH (ref 11.0–14.6)
Retic %: 2.81 % — ABNORMAL HIGH (ref 0.80–1.80)
Retic Ct Abs: 85.14 10*3/uL (ref 34.80–93.90)
WBC: 5 10*3/uL (ref 4.0–10.3)
lymph#: 1 10*3/uL (ref 0.9–3.3)

## 2016-12-14 LAB — COMPREHENSIVE METABOLIC PANEL
ALBUMIN: 2.7 g/dL — AB (ref 3.5–5.0)
ALT: 25 U/L (ref 0–55)
AST: 34 U/L (ref 5–34)
Alkaline Phosphatase: 98 U/L (ref 40–150)
Anion Gap: 8 mEq/L (ref 3–11)
BILIRUBIN TOTAL: 3.68 mg/dL — AB (ref 0.20–1.20)
BUN: 14.4 mg/dL (ref 7.0–26.0)
CALCIUM: 8.8 mg/dL (ref 8.4–10.4)
CHLORIDE: 104 meq/L (ref 98–109)
CO2: 22 mEq/L (ref 22–29)
CREATININE: 1.2 mg/dL (ref 0.7–1.3)
EGFR: >60 mL/min/{1.73_m2} (ref >60)
Glucose: 108 mg/dl (ref 70–140)
Potassium: 4.2 mEq/L (ref 3.5–5.1)
Sodium: 134 mEq/L — ABNORMAL LOW (ref 136–145)
TOTAL PROTEIN: 6.4 g/dL (ref 6.4–8.3)

## 2016-12-14 LAB — FERRITIN: Ferritin: 17 ng/ml — ABNORMAL LOW (ref 22–316)

## 2016-12-14 MED ORDER — SUCRALFATE 1 GM/10ML PO SUSP
1.0000 g | Freq: Two times a day (BID) | ORAL | 1 refills | Status: DC
Start: 2016-12-14 — End: 2017-03-22

## 2016-12-14 MED ORDER — SODIUM CHLORIDE 0.9 % IV SOLN
Freq: Once | INTRAVENOUS | Status: AC
  Administered 2016-12-14: 11:00:00 via INTRAVENOUS

## 2016-12-14 MED ORDER — SODIUM CHLORIDE 0.9 % IV SOLN
750.0000 mg | Freq: Once | INTRAVENOUS | Status: AC
  Administered 2016-12-14: 750 mg via INTRAVENOUS
  Filled 2016-12-14: qty 15

## 2016-12-14 MED ORDER — TRANEXAMIC ACID 650 MG PO TABS: 1300.0000 mg | ORAL_TABLET | Freq: Two times a day (BID) | ORAL | 1 refills | Status: DC

## 2016-12-14 NOTE — Patient Instructions (Signed)
Ferric carboxymaltose injection What is this medicine? FERRIC CARBOXYMALTOSE (ferr-ik car-box-ee-mol-toes) is an iron complex. Iron is used to make healthy red blood cells, which carry oxygen and nutrients throughout the body. This medicine is used to treat anemia in people with chronic kidney disease or people who cannot take iron by mouth. This medicine may be used for other purposes; ask your health care provider or pharmacist if you have questions. COMMON BRAND NAME(S): Injectafer What should I tell my health care provider before I take this medicine? They need to know if you have any of these conditions: -anemia not caused by low iron levels -high levels of iron in the blood -liver disease -an unusual or allergic reaction to iron, other medicines, foods, dyes, or preservatives -pregnant or trying to get pregnant -breast-feeding How should I use this medicine? This medicine is for infusion into a vein. It is given by a health care professional in a hospital or clinic setting. Talk to your pediatrician regarding the use of this medicine in children. Special care may be needed. Overdosage: If you think you have taken too much of this medicine contact a poison control center or emergency room at once. NOTE: This medicine is only for you. Do not share this medicine with others. What if I miss a dose? It is important not to miss your dose. Call your doctor or health care professional if you are unable to keep an appointment. What may interact with this medicine? Do not take this medicine with any of the following medications: -deferoxamine -dimercaprol -other iron products This medicine may also interact with the following medications: -chloramphenicol -deferasirox This list may not describe all possible interactions. Give your health care provider a list of all the medicines, herbs, non-prescription drugs, or dietary supplements you use. Also tell them if you smoke, drink alcohol, or use  illegal drugs. Some items may interact with your medicine. What should I watch for while using this medicine? Visit your doctor or health care professional regularly. Tell your doctor if your symptoms do not start to get better or if they get worse. You may need blood work done while you are taking this medicine. You may need to follow a special diet. Talk to your doctor. Foods that contain iron include: whole grains/cereals, dried fruits, beans, or peas, leafy green vegetables, and organ meats (liver, kidney). What side effects may I notice from receiving this medicine? Side effects that you should report to your doctor or health care professional as soon as possible: -allergic reactions like skin rash, itching or hives, swelling of the face, lips, or tongue -breathing problems -changes in blood pressure -feeling faint or lightheaded, falls -flushing, sweating, or hot feelings Side effects that usually do not require medical attention (report to your doctor or health care professional if they continue or are bothersome): -changes in taste -constipation -dizziness -headache -nausea -pain, redness, or irritation at site where injected -vomiting This list may not describe all possible side effects. Call your doctor for medical advice about side effects. You may report side effects to FDA at 1-800-FDA-1088. Where should I keep my medicine? This drug is given in a hospital or clinic and will not be stored at home. NOTE: This sheet is a summary. It may not cover all possible information. If you have questions about this medicine, talk to your doctor, pharmacist, or health care provider.  2018 Elsevier/Gold Standard (2015-02-26 11:20:47)  

## 2016-12-15 ENCOUNTER — Telehealth: Payer: Self-pay

## 2016-12-15 ENCOUNTER — Ambulatory Visit (HOSPITAL_COMMUNITY)
Admission: RE | Admit: 2016-12-15 | Discharge: 2016-12-15 | Disposition: A | Discharge status: Home / Self Care | Payer: 59 | Source: Ambulatory Visit | Attending: Hematology | Admitting: Hematology

## 2016-12-15 ENCOUNTER — Other Ambulatory Visit: Payer: Self-pay

## 2016-12-15 DIAGNOSIS — K922 Gastrointestinal hemorrhage, unspecified: Secondary | ICD-10-CM

## 2016-12-15 DIAGNOSIS — D649 Anemia, unspecified: Secondary | ICD-10-CM | POA: Insufficient documentation

## 2016-12-15 MED FILL — CARAFATE 1 GM/10 ML SUSP: 1 | 14 days supply | Qty: 420 | Fill #0

## 2016-12-15 MED FILL — TRANEXAMIC ACID 650 MG TAB: 650 | 30 days supply | Qty: 120 | Fill #0

## 2016-12-15 NOTE — Telephone Encounter (Signed)
Left VM on pt mobile phone to make aware of appt tomorrow morning at 0800 in sickle cell. Pt and wife given map and directions to Sickle Cell at appt yesterday. Pt to receive 2U PRBCs. Orders placed. HAR obtained through Admitting. Blood Bank notified to expect type and screen tomorrow and number of units to be administered. Pt wife called earlier and aware the pt to have appt tomorrow (11/8) or Monday (11/12) to receive blood.

## 2016-12-16 ENCOUNTER — Ambulatory Visit (HOSPITAL_COMMUNITY)
Admission: RE | Admit: 2016-12-16 | Discharge: 2016-12-16 | Disposition: A | Discharge status: Home / Self Care | Payer: 59 | Source: Ambulatory Visit | Attending: Hematology | Admitting: Hematology

## 2016-12-16 DIAGNOSIS — K922 Gastrointestinal hemorrhage, unspecified: Secondary | ICD-10-CM | POA: Diagnosis not present

## 2016-12-16 DIAGNOSIS — D649 Anemia, unspecified: Secondary | ICD-10-CM

## 2016-12-16 LAB — PREPARE RBC (CROSSMATCH)

## 2016-12-16 MED ORDER — DIPHENHYDRAMINE HCL 25 MG PO CAPS
25.0000 mg | ORAL_CAPSULE | Freq: Once | ORAL | Status: AC
  Administered 2016-12-16: 25 mg via ORAL
  Filled 2016-12-16: qty 1

## 2016-12-16 MED ORDER — SODIUM CHLORIDE 0.9 % IV SOLN
250.0000 mL | Freq: Once | INTRAVENOUS | Status: AC
  Administered 2016-12-16: 250 mL via INTRAVENOUS

## 2016-12-16 MED ORDER — ACETAMINOPHEN 325 MG PO TABS
650.0000 mg | ORAL_TABLET | Freq: Once | ORAL | Status: AC
  Administered 2016-12-16: 650 mg via ORAL
  Filled 2016-12-16: qty 2

## 2016-12-16 NOTE — Discharge Instructions (Signed)
Blood Transfusion, Adult, Care After This sheet gives you information about how to care for yourself after your procedure. Your health care provider may also give you more specific instructions. If you have problems or questions, contact your health care provider. What can I expect after the procedure? After your procedure, it is common to have:  Bruising and soreness where the IV tube was inserted.  Headache.  Follow these instructions at home:  Take over-the-counter and prescription medicines only as told by your health care provider.  Return to your normal activities as told by your health care provider.  Follow instructions from your health care provider about how to take care of your IV insertion site. Make sure you: ? Wash your hands with soap and water before you change your bandage (dressing). If soap and water are not available, use hand sanitizer. ? Change your dressing as told by your health care provider.  Check your IV insertion site every day for signs of infection. Check for: ? More redness, swelling, or pain. ? More fluid or blood. ? Warmth. ? Pus or a bad smell. Contact a health care provider if:  You have more redness, swelling, or pain around the IV insertion site.  You have more fluid or blood coming from the IV insertion site.  Your IV insertion site feels warm to the touch.  You have pus or a bad smell coming from the IV insertion site.  Your urine turns pink, red, or brown.  You feel weak after doing your normal activities. Get help right away if:  You have signs of a serious allergic or immune system reaction, including: ? Itchiness. ? Hives. ? Trouble breathing. ? Anxiety. ? Chest or lower back pain. ? Fever, flushing, and chills. ? Rapid pulse. ? Rash. ? Diarrhea. ? Vomiting. ? Dark urine. ? Serious headache. ? Dizziness. ? Stiff neck. ? Yellow coloration of the face or the white parts of the eyes (jaundice). This information is not  intended to replace advice given to you by your health care provider. Make sure you discuss any questions you have with your health care provider. Document Released: 02/14/2014 Document Revised: 09/23/2015 Document Reviewed: 08/10/2015 Elsevier Interactive Patient Education  2018 [employer].   Blood Transfusion A blood transfusion is a procedure in which you are given blood through an IV tube. You may need this procedure because of:  Illness.  Surgery.  Injury.  The blood may come from someone else (a donor). You may also be able to donate blood for yourself (autologous blood donation). The blood given in a transfusion is made up of different types of cells. You may get:  Red blood cells. These carry oxygen to the cells in the body.  White blood cells. These help you fight infections.  Platelets. These help your blood to clot.  Plasma. This is the liquid part of your blood. It helps with fluid imbalances.  If you have a clotting disorder, you may also get other types of blood products. What happens before the procedure?  You will have a blood test to find out your blood type. The test also finds out what type of blood your body will accept and matches it to the donor type.  If you are going to have a planned surgery, you may be able to donate your own blood. This may be done in case you need a transfusion.  If you have had an allergic reaction to a transfusion in the past, you may be  given medicine to help prevent a reaction. This medicine may be given to you by mouth or through an IV.  You will have your temperature, blood pressure, and pulse checked.  Follow instructions from your doctor about what you cannot eat or drink.  Ask your doctor about: ? Changing or stopping your regular medicines. This is important if you take diabetes medicines or blood thinners. ? Taking medicines such as aspirin and ibuprofen. These medicines can thin your blood. Do not take these medicines  before your procedure if your doctor tells you not to. What happens during the procedure?  An IV tube will be put into one of your veins.  The bag of donated blood will be attached to your IV tube. Then, the blood will enter through your vein.  Your temperature, blood pressure, and pulse will be checked regularly during the procedure. This is done to find early signs of a transfusion reaction.  If you have any signs or symptoms of a reaction, your transfusion will be stopped. You may also be given medicine.  When the transfusion is done, your IV tube will be taken out.  Pressure may be applied to the IV site for a few minutes.  A bandage (dressing) will be put on the IV site. The procedure may vary among doctors and hospitals. What happens after the procedure?  Your temperature, blood pressure, heart rate, breathing rate, and blood oxygen level will be checked often.  Your blood may be tested to see how you are responding to the transfusion.  You may be warmed with fluids or blankets. This is done to keep the temperature of your body normal. Summary  A blood transfusion is a procedure in which you are given blood through an IV tube.  The blood may come from someone else (a donor). You may also be able to donate blood for yourself.  If you have had an allergic reaction to a transfusion in the past, you may be given medicine to help prevent a reaction. This medicine may be given to you by mouth or through an IV tube.  Your temperature, blood pressure, heart rate, breathing rate, and blood oxygen level will be checked often.  Your blood may be tested to see how you are responding to the transfusion. This information is not intended to replace advice given to you by your health care provider. Make sure you discuss any questions you have with your health care provider. Document Released: 04/22/2008 Document Revised: 09/18/2015 Document Reviewed: 09/18/2015 Elsevier Interactive Patient  Education  2017 [employer].

## 2016-12-16 NOTE — Progress Notes (Signed)
PATIENT CARE CENTER NOTE  Diagnosis: Low hemaglobin   Provider: Dr. Carolyne Fiscal.    Procedure: Infusion of 2 units PRBC's   Note: Patient received two units of red blood cells. Patient tolerated well. Discharge instructions given. Patient expresses an understanding.  Patient alert, oriented and ambulatory upon discharge.

## 2016-12-17 LAB — BPAM RBC
Blood Product Expiration Date: 201812062359
Blood Product Expiration Date: 201812062359
ISSUE DATE / TIME: 201811090931
ISSUE DATE / TIME: 201811090931
UNIT TYPE AND RH: 5100
Unit Type and Rh: 5100

## 2016-12-17 LAB — TYPE AND SCREEN
ABO/RH(D): O POS
Antibody Screen: NEGATIVE
UNIT DIVISION: 0
UNIT DIVISION: 0

## 2016-12-19 ENCOUNTER — Telehealth: Payer: Self-pay | Admitting: Hematology

## 2016-12-19 NOTE — Telephone Encounter (Signed)
Added follow up visit to 12/5 lab/tx. Left message for patient re new time and confirmed next appointment for 11/21. Patient to get updated schedule at 11/21 visit.

## 2016-12-20 MED FILL — POTASSIUM CL ER 20 MEQ TABL: 20 | 30 days supply | Qty: 60 | Fill #5

## 2016-12-20 MED FILL — SPIRONOLACTONE 25 MG TABLET: 25 | 30 days supply | Qty: 30 | Fill #5

## 2016-12-20 MED FILL — TORSEMIDE 20 MG TABLET: 20 | 30 days supply | Qty: 60 | Fill #5

## 2016-12-28 ENCOUNTER — Telehealth: Payer: Self-pay

## 2016-12-28 ENCOUNTER — Other Ambulatory Visit (HOSPITAL_BASED_OUTPATIENT_CLINIC_OR_DEPARTMENT_OTHER): Payer: 59

## 2016-12-28 DIAGNOSIS — D62 Acute posthemorrhagic anemia: Secondary | ICD-10-CM

## 2016-12-28 DIAGNOSIS — D689 Coagulation defect, unspecified: Secondary | ICD-10-CM

## 2016-12-28 DIAGNOSIS — D500 Iron deficiency anemia secondary to blood loss (chronic): Secondary | ICD-10-CM

## 2016-12-28 LAB — CBC & DIFF AND RETIC
BASO%: 0.9 % (ref 0.0–2.0)
BASOS ABS: 0.1 10*3/uL (ref 0.0–0.1)
EOS%: 7 % (ref 0.0–7.0)
Eosinophils Absolute: 0.4 10*3/uL (ref 0.0–0.5)
HEMATOCRIT: 28.9 % — AB (ref 38.4–49.9)
HGB: 9 g/dL — ABNORMAL LOW (ref 13.0–17.1)
Immature Retic Fract: 10.3 % (ref 3.00–10.60)
LYMPH%: 14.5 % (ref 14.0–49.0)
MCH: 28.2 pg (ref 27.2–33.4)
MCHC: 31.1 g/dL — AB (ref 32.0–36.0)
MCV: 90.6 fL (ref 79.3–98.0)
MONO#: 1 10*3/uL — ABNORMAL HIGH (ref 0.1–0.9)
MONO%: 17.5 % — AB (ref 0.0–14.0)
NEUT#: 3.4 10*3/uL (ref 1.5–6.5)
NEUT%: 60.1 % (ref 39.0–75.0)
PLATELETS: 115 10*3/uL — AB (ref 140–400)
RBC: 3.19 10*6/uL — ABNORMAL LOW (ref 4.20–5.82)
RDW: 21.6 % — ABNORMAL HIGH (ref 11.0–14.6)
Retic %: 4.04 % — ABNORMAL HIGH (ref 0.80–1.80)
Retic Ct Abs: 128.88 10*3/uL — ABNORMAL HIGH (ref 34.80–93.90)
WBC: 5.6 10*3/uL (ref 4.0–10.3)
lymph#: 0.8 10*3/uL — ABNORMAL LOW (ref 0.9–3.3)

## 2016-12-28 NOTE — Telephone Encounter (Signed)
Pt wife called requesting results from CBC today. Hgb 9.0 elevated from recent lab work. Pt to return on 12/5 for lab, doctor, and infusion. Pt and wife aware to contact office with any questions or concerns in the meantime.

## 2017-01-03 MED FILL — METOPROLOL TARTRATE 25 MG T: 25 | 60 days supply | Qty: 60 | Fill #3

## 2017-01-04 ENCOUNTER — Ambulatory Visit (INDEPENDENT_AMBULATORY_CARE_PROVIDER_SITE_OTHER): Payer: 59 | Admitting: Family Medicine

## 2017-01-04 ENCOUNTER — Other Ambulatory Visit: Payer: Self-pay

## 2017-01-04 ENCOUNTER — Encounter: Payer: Self-pay | Admitting: Family Medicine

## 2017-01-04 VITALS — BP 122/71 | HR 76 | Temp 98.1°F | Resp 16 | Ht 68.0 in | Wt 239.2 lb

## 2017-01-04 DIAGNOSIS — D500 Iron deficiency anemia secondary to blood loss (chronic): Secondary | ICD-10-CM | POA: Diagnosis not present

## 2017-01-04 DIAGNOSIS — I10 Essential (primary) hypertension: Secondary | ICD-10-CM | POA: Diagnosis not present

## 2017-01-04 DIAGNOSIS — I2510 Atherosclerotic heart disease of native coronary artery without angina pectoris: Secondary | ICD-10-CM

## 2017-01-04 DIAGNOSIS — R509 Fever, unspecified: Secondary | ICD-10-CM | POA: Diagnosis not present

## 2017-01-04 DIAGNOSIS — E7800 Pure hypercholesterolemia, unspecified: Secondary | ICD-10-CM

## 2017-01-04 DIAGNOSIS — E119 Type 2 diabetes mellitus without complications: Secondary | ICD-10-CM | POA: Diagnosis not present

## 2017-01-04 LAB — POCT GLYCOSYLATED HEMOGLOBIN (HGB A1C): HEMOGLOBIN A1C: 5.5

## 2017-01-04 NOTE — Progress Notes (Signed)
OFFICE VISIT  01/04/2017   CC:  Chief Complaint  Patient presents with  . Follow-up    RCI, pt is fasting.     HPI:    Patient is a 66 y.o. Caucasian male who presents for 3 mo f/u DM 2, HLD, HTN. Has hx of iron def anemia from chronic GI blood loss, with multiple hospitalizations due to symptomatic anemia--MANY transfusions. Extensive GI and hematologic w/u has not shown a cause for the blood loss.  Hem put pt on Lysteda for help with blood loss reduction.  DM 2: daily fasting avg for the last 1 mo is about 110-115.    HTN: home monitoring shows avg 110/60s, HR avg 70s.  HLD: taking 75m Crestor on Fridays only.  He did not get insurance approval for PCSK9 inhibitors. Has had elevated liver enzymes on statins in the past.  These have also caused achiness and fatigue in the past.  ROS: gets temp 99.5-100.1 almost nightly and feels mild f/c with this, responds to tylenol well.  No signif resp sx's, no cough, no SOB or CP, has fatigue when his Hb drops signif but after transfusion he says he feels great.  No joint swelling. Only trace LE edema since getting on lasix.  No HAs.  No abd pains.  No n/v/d.  Past Medical History:  Diagnosis Date  . AAA (abdominal aortic aneurysm) ([facility]   3.2 cm on MR abd.  F/u aortic u/s 06/2014 showed 3.0 x 3.1 cm AAA: recheck 2 yrs recommended (followed by cardiologist).  Minimal growth on 10/2016 CT abd done for epig pain.  Repeat u/s 10/2018.  .Marland KitchenAnemia   . Bilateral renal cysts    simple (03/2014 MRI)  . CAD (coronary artery disease)   . Cholelithiases 2018   asymptomatic  . Chronic diastolic heart failure ([facility]   . Cirrhosis ([facility] 05/2016   secondary to NASH; most recent u/s abd 07/2016 showed splenomegaly.  Hx of portal HTN changes with mild ascites and splenomegaly.  . Diabetes mellitus with complication ([facility] 049/7026  A1c 6.8%  . History of blood transfusion 2018 X 4 dates   "low blood" (09/21/2016)  . Hyperlipemia, mixed    elevated  LFTs when on statins.    . Hypertension    Cr bump 04/01/16 so I changed benicar-hct to benicar plain and added amlodipine 5 mg.  . Iron deficiency anemia 05/2016   Acute blood loss anemia: hospitalized, required transfusion x 3 U: colonoscopy and capsule study unrevealing.  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  W/u for ? hemolytic anemia to be pursued by hematologist as outpt.  Appt with Dr. DCristi Loronat [facility]8/7/18.  . Microscopic hematuria    Eval unremarkable by Dr. DEulogio Ditch  .Marland KitchenNASH (nonalcoholic steatohepatitis)    Fatty liver on MR abd 03/2014, + hx of elevated transaminases and bili: followed by Dr. MCollene Mares  . Obesity   . Spleen enlarged     Past Surgical History:  Procedure Laterality Date  . CARDIAC CATHETERIZATION    . CARDIOVASCULAR STRESS TEST  01/17/2012; 05/04/16   Normal stress nuclear study x 2 (2018--Normal perfusion. LVEF 66% with normal wall motion. This is a low risk study).  . CERVICAL DISCECTOMY  1992  . COLONOSCOPY N/A 05/27/2016   No site/explanation for blood loss found.  Erythematous mucosa in cecum and ascending colon--Cecal bx: normal.  Procedure: COLONOSCOPY;  Surgeon:  Carol Ada, MD;  Location[facility] ENDOSCOPY;  Service: Endoscopy;  Laterality: N/A;  . COLONOSCOPY W/ POLYPECTOMY  09/10/2014   Polypectomy x 2: recall 5 yrs (Dr. [name]).  . CORONARY ANGIOPLASTY    . CORONARY ARTERY BYPASS GRAFT  03/2006  . ENTEROSCOPY N/A 07/31/2016   Procedure: ENTEROSCOPY;  Surgeon: Ladene Artist, MD;  Location[facility] ENDOSCOPY;  Service: Endoscopy;  Laterality: N/A;  . ENTEROSCOPY N/A 12/02/2016   Procedure: ENTEROSCOPY;  Surgeon: Carol Ada, MD;  Location: [facility] ENDOSCOPY;  Service: Endoscopy;  Laterality: N/A;  . ESOPHAGOGASTRODUODENOSCOPY N/A 05/25/2016   No site/explanation for blood loss found.  Procedure: ESOPHAGOGASTRODUODENOSCOPY (EGD);  Surgeon: Juanita Craver, MD;  Location: [facility]  ENDOSCOPY;  Service: Endoscopy;  Laterality: N/A;  . ESOPHAGOGASTRODUODENOSCOPY N/A 09/23/2016   Procedure: ESOPHAGOGASTRODUODENOSCOPY (EGD);  Surgeon: Carol Ada, MD;  Location: [place];  Service: Endoscopy;  Laterality: N/A;  . GIVENS CAPSULE STUDY N/A 05/27/2016   No source identified.  Repeat 10/2016--results pending.  Procedure: GIVENS CAPSULE STUDY;  Surgeon: Carol Ada, MD;  Location: [facility] ENDOSCOPY;  Service: Endoscopy;  Laterality: N/A;  . GIVENS CAPSULE STUDY N/A 10/15/2016   Procedure: GIVENS CAPSULE STUDY;  Surgeon: Carol Ada, MD;  Location: [facility] ENDOSCOPY;  Service: Endoscopy;  Laterality: N/A;  . NECK SURGERY  1991  . SMALL BOWEL ENTEROSCOPY  10/2016   ([facility], Dr. [name]: Double balloon enteroscopy--to the level of proximal ileum) jejunal polyps- which were removed--not bleeding & benign.  No source of bleeding was identified.  . TRANSTHORACIC ECHOCARDIOGRAM  05/25/2016   EF 60%, normal wall motion, grd II DD, mild aortic stenosis, dilated aortic root and ascending aorta.  [name] VASECTOMY  1977    Outpatient Medications Prior to Visit  Medication Sig Dispense Refill  . acetaminophen (TYLENOL) 325 MG tablet Take 650 mg by mouth every 6 (six) hours as needed for fever.    . cholecalciferol (VITAMIN D) 1000 units tablet Take 1,000 Units by mouth daily.    [name] docusate sodium (COLACE) 100 MG capsule Take 100 mg by mouth 2 (two) times daily as needed for mild constipation (1 - 2 tabs as needed).    . fluticasone (FLONASE) 50 MCG/ACT nasal spray Place 2 sprays into both nostrils daily. (Patient taking differently: Place 2 sprays into both nostrils daily as needed for allergies. ) 16 g 6  . metoprolol tartrate (LOPRESSOR) 25 MG tablet Take 0.5 tablets (12.5 mg total) by mouth 2 (two) times daily. 60 tablet 6  . ondansetron (ZOFRAN) 4 MG tablet Take 1 tablet (4 mg total) by mouth every 8 (eight) hours as needed for nausea or vomiting. 10 tablet 0  . pantoprazole (PROTONIX) 40 MG tablet Take  1 tablet (40 mg total) by mouth 2 (two) times daily before a meal. 60 tablet 3  . potassium chloride SA (K-DUR,KLOR-CON) 20 MEQ tablet Take 1 tablet (20 mEq total) by mouth 2 (two) times daily. 60 tablet 6  . rosuvastatin (CRESTOR) 5 MG tablet Take 1 tablet by mouth 1-2 times per week as tolerated 24 tablet 3  . spironolactone (ALDACTONE) 25 MG tablet Take 1 tablet (25 mg total) by mouth daily. 30 tablet 6  . sucralfate (CARAFATE) 1 GM/10ML suspension Take 10 mLs (1 g total) 2 (two) times daily by mouth. Before meals. Not with other medications 420 mL 1  . torsemide (DEMADEX) 20 MG tablet Take 2 tablets (40 mg total) by mouth daily. 60 tablet 6  . tranexamic acid (LYSTEDA) 650 MG TABS tablet Take 2 tablets (  1,300 mg total) 2 (two) times daily by mouth. 120 tablet 1  . omega-3 acid ethyl esters (LOVAZA) 1 g capsule TAKE 2 CAPSULES BY MOUTH ONCE DAILY (Patient not taking: Reported on 01/04/2017) 180 capsule 3   No facility-administered medications prior to visit.     Allergies  Allergen Reactions  . Statins Other (See Comments)    Liver enzymes go up whenever on them    ROS As per HPI  PE: Blood pressure 122/71, pulse 76, temperature 98.1 F (36.7 C), temperature source Oral, resp. rate 16, height 5' 8"  (1.727 m), weight 239 lb 4 oz (108.5 kg), SpO2 97 %. Body mass index is 36.38 kg/m.  Gen: Alert, well appearing.  Patient is oriented to person, place, time, and situation. AFFECT: pleasant, lucid thought and speech. Eyes: no icterus. CV: RRR, 2/6 systolic murmur, S1 obscured, S2 distinct.  No rub or gallop. Chest is clear, no wheezing or rales. Normal symmetric air entry throughout both lung fields. No chest wall deformities or tenderness. ABD: soft, ND/NT. EXT: no clubbing, cyanosis, or edema.   LABS:  Lab Results  Component Value Date   WBC 5.6 12/28/2016   HGB 9.0 (L) 12/28/2016   HCT 28.9 (L) 12/28/2016   MCV 90.6 12/28/2016   PLT 115 (L) 12/28/2016     Chemistry       Component Value Date/Time   NA 134 (L) 12/14/2016 0855   K 4.2 12/14/2016 0855   CL 105 11/30/2016 1451   CO2 22 12/14/2016 0855   BUN 14.4 12/14/2016 0855   CREATININE 1.2 12/14/2016 0855      Component Value Date/Time   CALCIUM 8.8 12/14/2016 0855   ALKPHOS 98 12/14/2016 0855   AST 34 12/14/2016 0855   ALT 25 12/14/2016 0855   BILITOT 3.68 (HH) 12/14/2016 0855     Lab Results  Component Value Date   CHOL 180 09/29/2016   HDL 45.00 09/29/2016   LDLCALC 121 (H) 09/29/2016   TRIG 69.0 09/29/2016   CHOLHDL 4 09/29/2016     Lab Results  Component Value Date   HGBA1C 5.5 01/04/2017   POC A1c today:  5.5%  IMPRESSION AND PLAN:  1) DM 2: good control with diet--no meds. POC HbA1c today=5.5%.  2) HTN; The current medical regimen is effective;  continue present plan and medications. Lytes/cr good on check 3 weeks ago.  3) Hyperlipidemia: historically is poorly tolerant to statins.  Doesn't qualify for PCRK-9 inhibitors. Tolerating crestor 31m qd at this time.  No repeat lipid panel needed today. He sees cardiology in about a week.  4) Low grade "fever", nightly x about 2 mo. No symptoms to suggest infection or autoimmune/connective tissue dz. WBC normal 1 week ago. I think these temps are related to/secondary to the MANY repeated blood transfusions he has had with his GI blood loss over the last +months.  Watchful waiting approach at this time.  An After Visit Summary was printed and given to the patient.  FOLLOW UP: Return in about 3 months (around 04/06/2017) for routine chronic illness f/u (30 min).  Signed:  PCrissie Sickles MD           01/04/2017

## 2017-01-11 ENCOUNTER — Ambulatory Visit: Payer: 59

## 2017-01-11 ENCOUNTER — Other Ambulatory Visit: Payer: Self-pay

## 2017-01-11 ENCOUNTER — Ambulatory Visit (HOSPITAL_BASED_OUTPATIENT_CLINIC_OR_DEPARTMENT_OTHER): Payer: 59 | Admitting: Hematology

## 2017-01-11 ENCOUNTER — Encounter: Payer: Self-pay | Admitting: Hematology

## 2017-01-11 ENCOUNTER — Telehealth: Payer: Self-pay

## 2017-01-11 ENCOUNTER — Ambulatory Visit (HOSPITAL_BASED_OUTPATIENT_CLINIC_OR_DEPARTMENT_OTHER): Payer: 59

## 2017-01-11 ENCOUNTER — Other Ambulatory Visit (HOSPITAL_BASED_OUTPATIENT_CLINIC_OR_DEPARTMENT_OTHER): Payer: 59

## 2017-01-11 ENCOUNTER — Encounter: Payer: Self-pay | Admitting: Family Medicine

## 2017-01-11 ENCOUNTER — Ambulatory Visit (HOSPITAL_COMMUNITY)
Admission: RE | Admit: 2017-01-11 | Discharge: 2017-01-11 | Disposition: A | Discharge status: Home / Self Care | Payer: 59 | Source: Ambulatory Visit | Attending: Hematology | Admitting: Hematology

## 2017-01-11 VITALS — BP 123/47 | HR 80 | Temp 98.6°F | Resp 17 | Ht 68.0 in | Wt 240.3 lb

## 2017-01-11 VITALS — BP 106/49 | HR 74 | Temp 98.3°F | Resp 16

## 2017-01-11 DIAGNOSIS — D500 Iron deficiency anemia secondary to blood loss (chronic): Secondary | ICD-10-CM

## 2017-01-11 DIAGNOSIS — K7581 Nonalcoholic steatohepatitis (NASH): Secondary | ICD-10-CM | POA: Diagnosis not present

## 2017-01-11 DIAGNOSIS — K922 Gastrointestinal hemorrhage, unspecified: Secondary | ICD-10-CM | POA: Diagnosis not present

## 2017-01-11 DIAGNOSIS — K921 Melena: Secondary | ICD-10-CM

## 2017-01-11 DIAGNOSIS — D62 Acute posthemorrhagic anemia: Secondary | ICD-10-CM

## 2017-01-11 DIAGNOSIS — D649 Anemia, unspecified: Secondary | ICD-10-CM

## 2017-01-11 LAB — CBC & DIFF AND RETIC
BASO%: 1.5 % (ref 0.0–2.0)
Basophils Absolute: 0.1 10*3/uL (ref 0.0–0.1)
EOS%: 7.7 % — AB (ref 0.0–7.0)
Eosinophils Absolute: 0.4 10*3/uL (ref 0.0–0.5)
HCT: 24.1 % — ABNORMAL LOW (ref 38.4–49.9)
HGB: 7.5 g/dL — ABNORMAL LOW (ref 13.0–17.1)
Immature Retic Fract: 11.4 % — ABNORMAL HIGH (ref 3.00–10.60)
LYMPH%: 17.5 % (ref 14.0–49.0)
MCH: 26.9 pg — AB (ref 27.2–33.4)
MCHC: 31.1 g/dL — AB (ref 32.0–36.0)
MCV: 86.4 fL (ref 79.3–98.0)
MONO#: 0.7 10*3/uL (ref 0.1–0.9)
MONO%: 15.6 % — AB (ref 0.0–14.0)
NEUT%: 57.7 % (ref 39.0–75.0)
NEUTROS ABS: 2.6 10*3/uL (ref 1.5–6.5)
Platelets: 125 10*3/uL — ABNORMAL LOW (ref 140–400)
RBC: 2.79 10*6/uL — ABNORMAL LOW (ref 4.20–5.82)
RDW: 18.3 % — AB (ref 11.0–14.6)
RETIC %: 2.89 % — AB (ref 0.80–1.80)
Retic Ct Abs: 80.63 10*3/uL (ref 34.80–93.90)
WBC: 4.6 10*3/uL (ref 4.0–10.3)
lymph#: 0.8 10*3/uL — ABNORMAL LOW (ref 0.9–3.3)

## 2017-01-11 LAB — COMPREHENSIVE METABOLIC PANEL
ALT: 27 U/L (ref 0–55)
ANION GAP: 9 meq/L (ref 3–11)
AST: 34 U/L (ref 5–34)
Albumin: 3 g/dL — ABNORMAL LOW (ref 3.5–5.0)
Alkaline Phosphatase: 92 U/L (ref 40–150)
BILIRUBIN TOTAL: 4.33 mg/dL — AB (ref 0.20–1.20)
BUN: 17.7 mg/dL (ref 7.0–26.0)
CALCIUM: 8.5 mg/dL (ref 8.4–10.4)
CO2: 20 mEq/L — ABNORMAL LOW (ref 22–29)
CREATININE: 1.4 mg/dL — AB (ref 0.7–1.3)
Chloride: 107 mEq/L (ref 98–109)
EGFR: 50 mL/min/{1.73_m2} — ABNORMAL LOW (ref >60)
Glucose: 140 mg/dl (ref 70–140)
Potassium: 4.1 mEq/L (ref 3.5–5.1)
Sodium: 136 mEq/L (ref 136–145)
TOTAL PROTEIN: 6.6 g/dL (ref 6.4–8.3)

## 2017-01-11 LAB — PROTIME-INR
INR: 1.4 — ABNORMAL LOW (ref 2.00–3.50)
PROTIME: 16.8 s — AB (ref 10.6–13.4)

## 2017-01-11 LAB — PREPARE RBC (CROSSMATCH)

## 2017-01-11 LAB — FERRITIN: Ferritin: 37 ng/ml (ref 22–316)

## 2017-01-11 MED ORDER — SODIUM CHLORIDE 0.9 % IV SOLN
750.0000 mg | Freq: Once | INTRAVENOUS | Status: AC
  Administered 2017-01-11: 750 mg via INTRAVENOUS
  Filled 2017-01-11: qty 15

## 2017-01-11 NOTE — Telephone Encounter (Signed)
Please advise. Thanks.  

## 2017-01-11 NOTE — Telephone Encounter (Addendum)
Pt to receive 1U PRBCs on Saturday. Orders placed. HAR confirmed with admitting. Lab and phlebotomy made aware. Confirmed with infusion RN that tube was received to draw type and screen.

## 2017-01-11 NOTE — Patient Instructions (Signed)
Thank you for choosing [facility] to provide your oncology and hematology care.  To afford each patient quality time with our providers, please arrive 30 minutes before your scheduled appointment time.  If you arrive late for your appointment, you may be asked to reschedule.  We strive to give you quality time with our providers, and arriving late affects you and other patients whose appointments are after yours.  If you are a no show for multiple scheduled visits, you may be dismissed from the clinic at the providers discretion.   Again, thank you for choosing [facility], our hope is that these requests will decrease the amount of time that you wait before being seen by our physicians.  ______________________________________________________________________ Should you have questions after your visit to the [facility], please contact our office at [phone] between the hours of 8:30 and 4:30 p.m.    Voicemails left after 4:30p.m will not be returned until the following business day.   For prescription refill requests, please have your pharmacy contact us directly.  Please also try to allow 48 hours for prescription requests.   Please contact the scheduling department for questions regarding scheduling.  For scheduling of procedures such as PET scans, CT scans, MRI, Ultrasound, etc please contact central scheduling at [phone].   Resources For Cancer Patients and Caregivers:  American Cancer Society:  800-227-2345  Can help patients locate various types of support and financial assistance Cancer Care: 1-800-813-HOPE (4673) Provides financial assistance, online support groups, medication/co-pay assistance.   [place] DSS:  336-641-3447 Where to apply for food stamps, Medicaid, and utility assistance Medicare Rights Center: 800-333-4114 Helps people with Medicare understand their rights and benefits, navigate the Medicare system, and secure the  quality healthcare they deserve SCAT: 336-333-6589 [employer]'s shared-ride transportation service for eligible riders who have a disability that prevents them from riding the fixed route bus.   For additional information on assistance programs please contact our social worker:   Grier Hock/Abigail Elmore:  [phone] 

## 2017-01-11 NOTE — Progress Notes (Signed)
HEMATOLOGY/ONCOLOGY CLINIC NOTE  Date of Service: 01/11/17  Patient Care Team: Tammi Sou, MD as PCP - General (Family Medicine) Juanita Craver, MD as Consulting Physician (Gastroenterology) Franchot Gallo, MD as Consulting Physician (Urology) Troy Sine, MD as Consulting Physician (Cardiology) Brunetta Genera, MD as Consulting Physician (Hematology)  CHIEF COMPLAINTS/PURPOSE OF CONSULTATION:  Chronic blood loss Anemia from GI bleeding.  HISTORY OF PRESENTING ILLNESS:   William [name] is a wonderful 66 y.o. male who has been referred to [place] by Dr .Anitra Lauth, Adrian Blackwater, MD for evaluation and management of Anemia.  Patient has a history of extensive medical comorbidities including liver cirrhosis related to [facility] with portal hypertension, esophageal varices and portal hypertensive gastropathy and splenomegaly, iron deficiency anemia, obesity, diabetes, AAA.  Patient was admitted in April 2018 with acute blood loss anemia and required transfusion of multiple units of PRBCs with iron profile suggestive of iron deficiency. No overt evidence of hemolysis noted. He had an EGD and colonoscopy that did not show any overt bleeding. Capsule endoscopy was unrevealing but the capsule past and only 27 minutes.  He follows with Dr. [name] who is his gastric oncologist.  Patient was again readmitted in June 2018 with symptomatically anemia and hemoglobin of 6.3. He underwent 4 units of PRBC transfusions again and was sent out on PPI and iron supplementation with the plan to follow-up with Dr. [name].  He was also given hematology referral. He had a repeat ultrasound of the abdomen on 07/29/2016 which showed significant increase in splenomegaly in 2 months suggesting significant portal hypertension versus some element of splenic sequestration.  He continues to note intermittent black stools.  Hemoglobin is stable and improved today and is up to 11.1. Patient notes he feels better  after his transfusions. Has not noted lower GI bleeding at this time. We discuss any other workup to rule out less likely other possibilities of his anemia.  INTERVAL HISTORY  Patient is here for f/u of his anemia that is primarily related to ongoing issues with GI bleeding. He states he has been doing well overall. He recently attended a ball game and vomited. He states he did not eat that morning. After his last blood transfusion on 12/16/2016 he felt much better but is now feeling more fatigued. HGB today is 7.5. His wife reports after his last blood transfusion, he was having a reaction involving chills and a fever overnight but was better in the morning.  On ROS, pt reports darker stools, fatigue and denies change in urine,    MEDICAL HISTORY:  Past Medical History:  Diagnosis Date  . AAA (abdominal aortic aneurysm) ([place]) 03/2014   3.2 cm on MR abd.  F/u aortic u/s 06/2014 showed 3.0 x 3.1 cm AAA: recheck 2 yrs recommended (followed by cardiologist).  Minimal growth on 10/2016 CT abd done for epig pain.  Repeat u/s 10/2018.  [name] Anemia   . Bilateral renal cysts    simple (03/2014 MRI)  . CAD (coronary artery disease)   . Cholelithiases 2018   asymptomatic  . Chronic diastolic heart failure ([place])   . Cirrhosis ([place]) 05/2016   secondary to NASH; most recent u/s abd 07/2016 showed splenomegaly.  Hx of portal HTN changes with mild ascites and splenomegaly.  . Diabetes mellitus with complication ([place]) 34/2876   A1c 6.8%  . History of blood transfusion 2018 X 4 dates   "low blood" (09/21/2016)  . Hyperlipemia, mixed    elevated LFTs when on  statins.    . Hypertension    Cr bump 04/01/16 so I changed benicar-hct to benicar plain and added amlodipine 5 mg.  . Iron deficiency anemia 05/2016   Acute blood loss anemia: hospitalized, required transfusion x 3 U: colonoscopy and capsule study unrevealing.  Readmitted 6/22-6/25, 2018 for symptomatic anemia again, got transfused x 4U, EGD with grd I  esoph varices and port hyt gastropathy.  W/u for ? hemolytic anemia to be pursued by hematologist as outpt.  Appt with Dr. Cristi Loron at [facility] 09/13/16.  . Microscopic hematuria    Eval unremarkable by Dr. Eulogio Ditch.  [name] NASH (nonalcoholic steatohepatitis)    Fatty liver on MR abd 03/2014, + hx of elevated transaminases and bili: followed by Dr. [name].  . Obesity   . Spleen enlarged     SURGICAL HISTORY: Past Surgical History:  Procedure Laterality Date  . CARDIAC CATHETERIZATION    . CARDIOVASCULAR STRESS TEST  01/17/2012; 05/04/16   Normal stress nuclear study x 2 (2018--Normal perfusion. LVEF 66% with normal wall motion. This is a low risk study).  . CERVICAL DISCECTOMY  1992  . COLONOSCOPY N/A 05/27/2016   No site/explanation for blood loss found.  Erythematous mucosa in cecum and ascending colon--Cecal bx: normal.  Procedure: COLONOSCOPY;  Surgeon: Carol Ada, MD;  Location: [facility] ENDOSCOPY;  Service: Endoscopy;  Laterality: N/A;  . COLONOSCOPY W/ POLYPECTOMY  09/10/2014   Polypectomy x 2: recall 5 yrs (Dr. [name]).  . CORONARY ANGIOPLASTY    . CORONARY ARTERY BYPASS GRAFT  03/2006  . ENTEROSCOPY N/A 07/31/2016   Procedure: ENTEROSCOPY;  Surgeon: Ladene Artist, MD;  Location: [facility] ENDOSCOPY;  Service: Endoscopy;  Laterality: N/A;  . ENTEROSCOPY N/A 12/02/2016   Procedure: ENTEROSCOPY;  Surgeon: Carol Ada, MD;  Location: [facility] ENDOSCOPY;  Service: Endoscopy;  Laterality: N/A;  . ESOPHAGOGASTRODUODENOSCOPY N/A 05/25/2016   No site/explanation for blood loss found.  Procedure: ESOPHAGOGASTRODUODENOSCOPY (EGD);  Surgeon: Juanita Craver, MD;  Location: [facility] ENDOSCOPY;  Service: Endoscopy;  Laterality: N/A;  . ESOPHAGOGASTRODUODENOSCOPY N/A 09/23/2016   Procedure: ESOPHAGOGASTRODUODENOSCOPY (EGD);  Surgeon: Carol Ada, MD;  Location: [place];  Service: Endoscopy;  Laterality: N/A;  . GIVENS CAPSULE STUDY N/A 05/27/2016   No source identified.  Repeat 10/2016--results pending.  Procedure: GIVENS  CAPSULE STUDY;  Surgeon: Carol Ada, MD;  Location: [facility] ENDOSCOPY;  Service: Endoscopy;  Laterality: N/A;  . GIVENS CAPSULE STUDY N/A 10/15/2016   Procedure: GIVENS CAPSULE STUDY;  Surgeon: Carol Ada, MD;  Location: [facility] ENDOSCOPY;  Service: Endoscopy;  Laterality: N/A;  . NECK SURGERY  1991  . SMALL BOWEL ENTEROSCOPY  10/2016   ([facility], Dr. [name]: Double balloon enteroscopy--to the level of proximal ileum) jejunal polyps- which were removed--not bleeding & benign.  No source of bleeding was identified.  . TRANSTHORACIC ECHOCARDIOGRAM  05/25/2016   EF 60%, normal wall motion, grd II DD, mild aortic stenosis, dilated aortic root and ascending aorta.  [name] VASECTOMY  1977    SOCIAL HISTORY: Social History   Socioeconomic History  . Marital status: Married    Spouse name: Not on file  . Number of children: Not on file  . Years of education: Not on file  . Highest education level: Not on file  Social Needs  . Financial resource strain: Not on file  . Food insecurity - worry: Not on file  . Food insecurity - inability: Not on file  . Transportation needs - medical: Not on file  . Transportation needs - non-medical: Not on  file  Occupational History  . Not on file  Tobacco Use  . Smoking status: Former Smoker    Types: Cigarettes  . Smokeless tobacco: Never Used  . Tobacco comment: QUIT I 1983  Substance and Sexual Activity  . Alcohol use: No  . Drug use: No  . Sexual activity: Yes  Other Topics Concern  . Not on file  Social History Narrative   Married, 3 grown children, 3 GCs.   Educ: 10th grade.   Occupation: Retired Brewing technologist.   Tob: quit 1983, smoked about 20 pack-yr hx prior.   No alcohol.    FAMILY HISTORY: Family History  Problem Relation Age of Onset  . Hypertension Mother   . Cancer - Other Mother        liver cancer  . Heart disease Father   . Heart attack Father   . Cancer - Lung Father   . Diabetes Father   . Liver disease Sister   . Anemia Sister   .  Heart disease Sister   . Heart attack Sister   . Heart disease Brother        CABG 20 YEARS AGO  . Hypertension Brother   . Mesothelioma Brother        HALF-BROTHER  . Cancer - Other Brother        CLL  . Cancer - Lung Brother        Mets to brain  . Cancer - Lung Sister        HALF-SISTER  . Liver disease Brother   . Heart disease Brother        CABG-2012  . Emphysema Maternal Grandfather   . Cancer - Other Paternal Grandmother        Stomach  . Heart attack Paternal Grandfather     ALLERGIES:  is allergic to statins.  MEDICATIONS:  Current Outpatient Medications  Medication Sig Dispense Refill  . acetaminophen (TYLENOL) 325 MG tablet Take 650 mg by mouth every 6 (six) hours as needed for fever.    . cholecalciferol (VITAMIN D) 1000 units tablet Take 1,000 Units by mouth daily.    [name] docusate sodium (COLACE) 100 MG capsule Take 100 mg by mouth 2 (two) times daily as needed for mild constipation (1 - 2 tabs as needed).    . fluticasone (FLONASE) 50 MCG/ACT nasal spray Place 2 sprays into both nostrils daily. (Patient taking differently: Place 2 sprays into both nostrils daily as needed for allergies. ) 16 g 6  . metoprolol tartrate (LOPRESSOR) 25 MG tablet Take 0.5 tablets (12.5 mg total) by mouth 2 (two) times daily. 60 tablet 6  . ondansetron (ZOFRAN) 4 MG tablet Take 1 tablet (4 mg total) by mouth every 8 (eight) hours as needed for nausea or vomiting. 10 tablet 0  . pantoprazole (PROTONIX) 40 MG tablet Take 1 tablet (40 mg total) by mouth 2 (two) times daily before a meal. 60 tablet 3  . potassium chloride SA (K-DUR,KLOR-CON) 20 MEQ tablet Take 1 tablet (20 mEq total) by mouth 2 (two) times daily. 60 tablet 6  . rosuvastatin (CRESTOR) 5 MG tablet Take 1 tablet by mouth 1-2 times per week as tolerated 24 tablet 3  . spironolactone (ALDACTONE) 25 MG tablet Take 1 tablet (25 mg total) by mouth daily. 30 tablet 6  . sucralfate (CARAFATE) 1 GM/10ML suspension Take 10 mLs (1 g  total) 2 (two) times daily by mouth. Before meals. Not with other medications 420 mL 1  .  torsemide (DEMADEX) 20 MG tablet Take 2 tablets (40 mg total) by mouth daily. 60 tablet 6  . tranexamic acid (LYSTEDA) 650 MG TABS tablet Take 2 tablets (1,300 mg total) 2 (two) times daily by mouth. 120 tablet 1   No current facility-administered medications for this visit.     REVIEW OF SYSTEMS:    10 Point review of Systems was done is negative except as noted above.  PHYSICAL EXAMINATION:  ECOG PERFORMANCE STATUS: 1 - Symptomatic but completely ambulatory  . Vitals:   01/11/17 0935  BP: (!) 123/47  Pulse: 80  Resp: 17  Temp: 98.6 F (37 C)  SpO2: 100%   Filed Weights   01/11/17 0935  Weight: 240 lb 4.8 oz (109 kg)   .Body mass index is 36.54 kg/m.  GENERAL:alert, in no acute distress and comfortable SKIN: no acute rashes, no significant lesions EYES: conjunctiva are pink and non-injected, sclera anicteric OROPHARYNX: MMM, no exudates, no oropharyngeal erythema or ulceration NECK: supple, no JVD LYMPH:  no palpable lymphadenopathy in the cervical, axillary or inguinal regions LUNGS: clear to auscultation b/l with normal respiratory effort HEART: regular rate & rhythm ABDOMEN:  normoactive bowel sounds , non tender, not distended. Extremity: Trace pedal edema bilaterally.  PSYCH: alert & oriented x 3 with fluent speech NEURO: no focal motor/sensory deficits  LABORATORY DATA:  I have reviewed the data as listed  . CBC Latest Ref Rng & Units 01/11/2017 12/28/2016 12/14/2016  WBC 4.0 - 10.3 10e3/uL 4.6 5.6 5.0  Hemoglobin 13.0 - 17.1 g/dL 7.5(L) 9.0(L) 7.9(L)  Hematocrit 38.4 - 49.9 % 24.1(L) 28.9(L) 25.5(L)  Platelets 140 - 400 10e3/uL 125(L) 115(L) 119(L)    . CMP Latest Ref Rng & Units 01/11/2017 12/14/2016 11/30/2016  Glucose 70 - 140 mg/dl 140 108 101(H)  BUN 7.0 - 26.0 mg/dL 17.7 14.4 16  Creatinine 0.7 - 1.3 mg/dL 1.4(H) 1.2 1.21  Sodium 136 - 145 mEq/L 136 134(L)  136  Potassium 3.5 - 5.1 mEq/L 4.1 4.2 3.8  Chloride 101 - 111 mmol/L - - 105  CO2 22 - 29 mEq/L 20(L) 22 23  Calcium 8.4 - 10.4 mg/dL 8.5 8.8 8.4(L)  Total Protein 6.4 - 8.3 g/dL 6.6 6.4 6.2(L)  Total Bilirubin 0.20 - 1.20 mg/dL 4.33(HH) 3.68(HH) 3.7(H)  Alkaline Phos 40 - 150 U/L 92 98 97  AST 5 - 34 U/L 34 34 33  ALT 0 - 55 U/L 27 25 24     . Lab Results  Component Value Date   IRON 21 (L) 11/16/2016   TIBC 353 11/16/2016   IRONPCTSAT 6 (L) 11/16/2016   (Iron and TIBC)  Lab Results  Component Value Date   FERRITIN 37 01/11/2017    RADIOGRAPHIC STUDIES: I have personally reviewed the radiological images as listed and agreed with the findings in the report. No results found.  ASSESSMENT & PLAN:   66 year old male with multiple medical comorbidities with Karlene Lineman with liver cirrhosis with portal hypertension, esophageal varices and portal hypertensive gastropathy with  #1 Acute on Chronic blood loss anemia.  Patient has had recurrent GI bleeding requiring nearly 20 units of PRBCs. Between April 2018 and Aug 2018 and continues to have ongoing GI bleeding. Previous workup showed LDH within normal limits at 220 and suggests against overt hemolysis. Sedimentation rate within normal limits. Coombs' test is negative. Myeloma panel and serum free light chains suggest no monoclonal paraproteinemia. PNH testing negative Slightly lower haptoglobin levels can be related to decreased hepatic production, low-level hemolysis  due to transfusions or extravascular hemolysis due to splenic or hepatic sequestration.  small bowel endoscopy done on 12/02/2016: impression - The examined portion of the jejunum was normal.  - Normal examined duodenum. - Portal hypertensive gastropathy.  -Small (< 5 mm) esophageal varices. - No specimens collected.  PLAN -we discussed that he continues to have ongoing GI bleeding  -he has had extensive GI evaluation with no overt source evidence of controllable  GIB at this time. -PRBC transfusion 1 units in 2-3 days for symptomatic anemia - IV Injectafer q4 weeks x 4 doses as ordered -continue lysteda 138m po 2x daily -continue Sucralfate previously  -recommended again that pt discuss switching metroprolol to non specific beta blocker and optimizing dose to address portal hypertension with PCP for possible decrease of bleeding  -Again instructed pt to immediately report to ED for red blood cell tag scans if experiences black stools or other signs of overt bleeding. -Recommended pt consult with PCP about seeing hepatologist to track abnormal liver function and discuss possible use of beta blocker.-bilirubin 4.33 today -Also fu with PCP about overall treatment/management plan  -Continue IV iron 1x monthly to reduce need for blood transfusions as much as possible to keep ferritin 100 -hemoglobin and ferritin continue to drop, will set up transfusion 2u PRBC    -set up transfusion 1u PRBC in the next few days  -Continue IV injectafer  -labs today  RTC in 1 month for with Dr KIrene Limbofor IV iron, labs and clinic visit.   All of the patients questions were answered with apparent satisfaction. The patient knows to call the clinic with any problems, questions or concerns.  I spent 20 minutes counseling the patient face to face. The total time spent in the appointment was 25 minutes and more than 50% was on counseling and direct patient cares and co-ordination and arranging his transfer to the ED    GSullivan LoneMD MKendallAAHIVMS SLakeview Regional Medical CenterCAlbany Va Medical CenterHematology/Oncology Physician CSeville (Office):       [phone](Work cell):  [phone](Fax):           [phone] This document serves as a record of services personally performed by GSullivan Lone MD. It was created on his behalf by LAlean Rinne a trained medical scribe. The creation of this record is based on the scribe's personal observations and the provider's statements to them.   .I have  reviewed the above documentation for accuracy and completeness, and I agree with the above. .Brunetta GeneraMD

## 2017-01-11 NOTE — Patient Instructions (Signed)
Ferric carboxymaltose injection What is this medicine? FERRIC CARBOXYMALTOSE (ferr-ik car-box-ee-mol-toes) is an iron complex. Iron is used to make healthy red blood cells, which carry oxygen and nutrients throughout the body. This medicine is used to treat anemia in people with chronic kidney disease or people who cannot take iron by mouth. This medicine may be used for other purposes; ask your health care provider or pharmacist if you have questions. COMMON BRAND NAME(S): Injectafer What should I tell my health care provider before I take this medicine? They need to know if you have any of these conditions: -anemia not caused by low iron levels -high levels of iron in the blood -liver disease -an unusual or allergic reaction to iron, other medicines, foods, dyes, or preservatives -pregnant or trying to get pregnant -breast-feeding How should I use this medicine? This medicine is for infusion into a vein. It is given by a health care professional in a hospital or clinic setting. Talk to your pediatrician regarding the use of this medicine in children. Special care may be needed. Overdosage: If you think you have taken too much of this medicine contact a poison control center or emergency room at once. NOTE: This medicine is only for you. Do not share this medicine with others. What if I miss a dose? It is important not to miss your dose. Call your doctor or health care professional if you are unable to keep an appointment. What may interact with this medicine? Do not take this medicine with any of the following medications: -deferoxamine -dimercaprol -other iron products This medicine may also interact with the following medications: -chloramphenicol -deferasirox This list may not describe all possible interactions. Give your health care provider a list of all the medicines, herbs, non-prescription drugs, or dietary supplements you use. Also tell them if you smoke, drink alcohol, or use  illegal drugs. Some items may interact with your medicine. What should I watch for while using this medicine? Visit your doctor or health care professional regularly. Tell your doctor if your symptoms do not start to get better or if they get worse. You may need blood work done while you are taking this medicine. You may need to follow a special diet. Talk to your doctor. Foods that contain iron include: whole grains/cereals, dried fruits, beans, or peas, leafy green vegetables, and organ meats (liver, kidney). What side effects may I notice from receiving this medicine? Side effects that you should report to your doctor or health care professional as soon as possible: -allergic reactions like skin rash, itching or hives, swelling of the face, lips, or tongue -breathing problems -changes in blood pressure -feeling faint or lightheaded, falls -flushing, sweating, or hot feelings Side effects that usually do not require medical attention (report to your doctor or health care professional if they continue or are bothersome): -changes in taste -constipation -dizziness -headache -nausea -pain, redness, or irritation at site where injected -vomiting This list may not describe all possible side effects. Call your doctor for medical advice about side effects. You may report side effects to FDA at 1-800-FDA-1088. Where should I keep my medicine? This drug is given in a hospital or clinic and will not be stored at home. NOTE: This sheet is a summary. It may not cover all possible information. If you have questions about this medicine, talk to your doctor, pharmacist, or health care provider.  2018 Elsevier/Gold Standard (2015-02-26 11:20:47)  

## 2017-01-12 ENCOUNTER — Encounter: Payer: Self-pay | Admitting: Cardiovascular Disease

## 2017-01-12 ENCOUNTER — Ambulatory Visit: Payer: 59 | Admitting: Cardiovascular Disease

## 2017-01-12 VITALS — BP 118/50 | HR 79 | Ht 69.0 in | Wt 243.0 lb

## 2017-01-12 DIAGNOSIS — I350 Nonrheumatic aortic (valve) stenosis: Secondary | ICD-10-CM | POA: Diagnosis not present

## 2017-01-12 DIAGNOSIS — E785 Hyperlipidemia, unspecified: Secondary | ICD-10-CM

## 2017-01-12 DIAGNOSIS — I714 Abdominal aortic aneurysm, without rupture, unspecified: Secondary | ICD-10-CM

## 2017-01-12 DIAGNOSIS — D500 Iron deficiency anemia secondary to blood loss (chronic): Secondary | ICD-10-CM | POA: Diagnosis not present

## 2017-01-12 DIAGNOSIS — Z951 Presence of aortocoronary bypass graft: Secondary | ICD-10-CM | POA: Diagnosis not present

## 2017-01-12 DIAGNOSIS — I2510 Atherosclerotic heart disease of native coronary artery without angina pectoris: Secondary | ICD-10-CM | POA: Diagnosis not present

## 2017-01-12 DIAGNOSIS — I10 Essential (primary) hypertension: Secondary | ICD-10-CM | POA: Diagnosis not present

## 2017-01-12 NOTE — Patient Instructions (Signed)
Medication Instructions:  Your physician recommends that you continue on your current medications as directed. Please refer to the Current Medication list given to you today.  Labwork: Please return for FASTING labs in next week (Lipid)  Follow-Up: Your physician wants you to follow-up in: 6 months with Dr. Claiborne Billings. You will receive a reminder letter in the mail two months in advance. If you don't receive a letter, please call our office to schedule the follow-up appointment.   Any Other Special Instructions Will Be Listed Below (If Applicable).     If you need a refill on your cardiac medications before your next appointment, please call your pharmacy.

## 2017-01-12 NOTE — Progress Notes (Signed)
Patient ID: William [name], male   DOB: [birth year], 66 y.o.   MRN: [id number]    HPI: William [name] is a 66 y.o. male who presents to the office today for a 9-monthcardiology evaluation.  William [name] known CAD and underwent CABG revascularization surgery with a LIMA to the LAD, SVG to the RCA, SVG to the circumflex vessel in February 2008.  He has a history of obesity, metabolic syndrome, and mixed hyperlipidemia with preponderance of small dense LDL some particles and increased TG for which he has been on combination therapy.  In December 2013 a nuclear perfusion study was unchanged from 2 years previously and did not show significant scar or ischemia. In January 2014,  I adjusted his antihypertensive medications and started  Benicar HCT 40/25 for optimal blood pressure control with increase diuretic regimen and took him off his Avalide. He has felt improved on this therapy.  According to his wife, his blood pressure at home typically is in the 120s systolially and in the 791Mdiastolically.  He has a history of moderate obesity.  He does not routinely exercise.  He also does note some dietary discretion. He has noticed some mild leg swelling intermittently. He denies recent anginal symptoms. He denies palpitations.   In 2016 he underwent a urologic evaluation for gross hematuria. A CT of his abdomen and pelvis did not reveal any evidence for renal calculi or hydronephrosis or evidence for renal calculi or dilatation. He did have small benign renal cysts. He was also noted a 3.5 cm infrarenal abdominal aortic aneurysm. There was also a suggestion of fatty infiltration of his liver.  He underwent a follow-up abdominal aortic ultrasound on 06/27/2014.  This showed his distal aorta measuring 3.0 x 3.1 cm and he had mild amount of atherosclerosis.  He underwent a  colonoscopy by Dr. JTyrone Sageand was found to have 2 benign polyps.  He has been off  Crestor and niacin since last year due to elevation of  his liver enzymes. There is family history for liver disease in that his mother died from liver disease and his brother had a liver transplant.  In July 2016, his AST was 51 and ALT was 54.  Subsequent blood work by Dr. MCollene Maresrevealed his AST increased at 87 and ALT 68.  On 01/15/2015, his AST was 85 and ALT 64.  At that time, total cholesterol was 244, triglycerides 149, HDL 30, and his LDL cholesterol on statin therapy had risen to 184.   WhenI last saw him he denied chest tightness.  He was not been successful with weight loss and in fact has gained some weight.  Labs checked by his primary M.D.showed a hemoglobin A1c was 6.6.  LFTs had slightly improved but AST was still elevated at 71 and his ALT was now normal.   He underwent a nuclear perfusion study in March 2018.  This showed normal perfusion.  Ejection fraction was 66% with normal wall motion.  The test was felt to be low risk.  He underwent an echo Doppler study in April 2012 which showed an EF of 60-65% with mild LVH.  There was grade 2 diastolic dysfunction and increased filling pressures.  There was mild aortic stenosis with a mean gradient of 23 and peak gradient of 36, mild aortic root dilatation, mild biatrial enlargement, and mild TR.  He has had recurrent admissions for anemia and GI blood loss.  He has had undergone evaluations by Dr. MCollene Mares and  [place] and was planning to see Dr. [name]  at [facility] for further GI evaluation and possible procedures.  He has been intolerant to statin therapy secondary to LFT elevation.    Since I last saw him, he tells me that he went to [facility] and had small bowel evaluation.  He sexually has continued to have recurrent GI bleeds.  He has required packed red blood cell transfusions.  2 weeks ago.  His hemoglobin was 9 and hematocrit 28.9, and yesterday hemoglobin was 7.5 with hematocrit of 24.1.  He is scheduled to have another red blood cell transfusion on 01/14/2017.  He has also been undergoing IV iron therapy with  his hematologist, Dr. Irene Limbo.  He denies any episodes of chest pain.  His liver transaminases have been normal, but his bilirubin has increased such that 4 weeks ago was 3.68 and yesterday was 4.33.  He continues to be on metoprolol 12.5 twice a day, spironolactone 25 mg daily, torsemide and has had issues with statins and has only been taking rosuvastatin 5 mg 1 time a week.  He denies recurrent chest pain or palpitations, PND, orthopnea.  He presents for evaluation.   Past Medical History:  Diagnosis Date  . AAA (abdominal aortic aneurysm) ([place]) 03/2014   3.2 cm on MR abd.  F/u aortic u/s 06/2014 showed 3.0 x 3.1 cm AAA: recheck 2 yrs recommended (followed by cardiologist).  Minimal growth on 10/2016 CT abd done for epig pain.  Repeat u/s 10/2018.  [name] Anemia   . Bilateral renal cysts    simple (03/2014 MRI)  . CAD (coronary artery disease)   . Cholelithiases 2018   asymptomatic  . Chronic diastolic heart failure ([place])   . Cirrhosis ([place]) 05/2016   secondary to NASH; most recent u/s abd 07/2016 showed splenomegaly.  Hx of portal HTN changes with mild ascites and splenomegaly.  . Diabetes mellitus with complication ([place]) 96/7893   A1c 6.8%  . History of blood transfusion 2018 X 4 dates   "low blood" (09/21/2016)  . Hyperlipemia, mixed    elevated LFTs when on statins.    . Hypertension    Cr bump 04/01/16 so I changed benicar-hct to benicar plain and added amlodipine 5 mg.  . Iron deficiency anemia 05/2016   Acute blood loss anemia: hospitalized, required transfusion x 3 U: colonoscopy and capsule study unrevealing.  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  W/u for ? hemolytic anemia to be pursued by hematologist as outpt.  Appt with Dr. Cristi Loron at [facility] 09/13/16.  . Microscopic hematuria    Eval unremarkable by Dr. Eulogio Ditch.  [name] NASH (nonalcoholic steatohepatitis)    Fatty liver on MR abd 03/2014, + hx of  elevated transaminases and bili: followed by Dr. [name].  . Obesity   . Spleen enlarged     Past Surgical History:  Procedure Laterality Date  . CARDIAC CATHETERIZATION    . CARDIOVASCULAR STRESS TEST  01/17/2012; 05/04/16   Normal stress nuclear study x 2 (2018--Normal perfusion. LVEF 66% with normal wall motion. This is a low risk study).  . CERVICAL DISCECTOMY  1992  . COLONOSCOPY N/A 05/27/2016   No site/explanation for blood loss found.  Erythematous mucosa in cecum and ascending colon--Cecal bx: normal.  Procedure: COLONOSCOPY;  Surgeon: Carol Ada, MD;  Location: [facility] ENDOSCOPY;  Service: Endoscopy;  Laterality: N/A;  . COLONOSCOPY W/ POLYPECTOMY  09/10/2014   Polypectomy x  2: recall 5 yrs (Dr. [name]).  . CORONARY ANGIOPLASTY    . CORONARY ARTERY BYPASS GRAFT  03/2006  . ENTEROSCOPY N/A 07/31/2016   Procedure: ENTEROSCOPY;  Surgeon: Ladene Artist, MD;  Location: [facility] ENDOSCOPY;  Service: Endoscopy;  Laterality: N/A;  . ENTEROSCOPY N/A 12/02/2016   Procedure: ENTEROSCOPY;  Surgeon: Carol Ada, MD;  Location: [facility] ENDOSCOPY;  Service: Endoscopy;  Laterality: N/A;  . ESOPHAGOGASTRODUODENOSCOPY N/A 05/25/2016   No site/explanation for blood loss found.  Procedure: ESOPHAGOGASTRODUODENOSCOPY (EGD);  Surgeon: Juanita Craver, MD;  Location: [facility] ENDOSCOPY;  Service: Endoscopy;  Laterality: N/A;  . ESOPHAGOGASTRODUODENOSCOPY N/A 09/23/2016   Procedure: ESOPHAGOGASTRODUODENOSCOPY (EGD);  Surgeon: Carol Ada, MD;  Location: [place];  Service: Endoscopy;  Laterality: N/A;  . GIVENS CAPSULE STUDY N/A 05/27/2016   No source identified.  Repeat 10/2016--results pending.  Procedure: GIVENS CAPSULE STUDY;  Surgeon: Carol Ada, MD;  Location: [facility] ENDOSCOPY;  Service: Endoscopy;  Laterality: N/A;  . GIVENS CAPSULE STUDY N/A 10/15/2016   Procedure: GIVENS CAPSULE STUDY;  Surgeon: Carol Ada, MD;  Location: [facility] ENDOSCOPY;  Service: Endoscopy;  Laterality: N/A;  . NECK SURGERY  1991  . SMALL BOWEL  ENTEROSCOPY  10/2016   ([facility], Dr. [name]: Double balloon enteroscopy--to the level of proximal ileum) jejunal polyps- which were removed--not bleeding & benign.  No source of bleeding was identified.  . TRANSTHORACIC ECHOCARDIOGRAM  05/25/2016   EF 60%, normal wall motion, grd II DD, mild aortic stenosis, dilated aortic root and ascending aorta.  [name] VASECTOMY  1977    Allergies  Allergen Reactions  . Statins Other (See Comments)    Liver enzymes go up whenever on them    Current Outpatient Medications  Medication Sig Dispense Refill  . acetaminophen (TYLENOL) 325 MG tablet Take 650 mg by mouth every 6 (six) hours as needed for fever.    . cholecalciferol (VITAMIN D) 1000 units tablet Take 1,000 Units by mouth daily.    [name] docusate sodium (COLACE) 100 MG capsule Take 100 mg by mouth 2 (two) times daily as needed for mild constipation (1 - 2 tabs as needed).    . fluticasone (FLONASE) 50 MCG/ACT nasal spray Place 2 sprays into both nostrils daily. (Patient taking differently: Place 2 sprays into both nostrils daily as needed for allergies. ) 16 g 6  . metoprolol tartrate (LOPRESSOR) 25 MG tablet Take 0.5 tablets (12.5 mg total) by mouth 2 (two) times daily. 60 tablet 6  . ondansetron (ZOFRAN) 4 MG tablet Take 1 tablet (4 mg total) by mouth every 8 (eight) hours as needed for nausea or vomiting. 10 tablet 0  . pantoprazole (PROTONIX) 40 MG tablet Take 1 tablet (40 mg total) by mouth 2 (two) times daily before a meal. 60 tablet 3  . potassium chloride SA (K-DUR,KLOR-CON) 20 MEQ tablet Take 1 tablet (20 mEq total) by mouth 2 (two) times daily. 60 tablet 6  . rosuvastatin (CRESTOR) 5 MG tablet Take 1 tablet by mouth 1-2 times per week as tolerated 24 tablet 3  . spironolactone (ALDACTONE) 25 MG tablet Take 1 tablet (25 mg total) by mouth daily. 30 tablet 6  . sucralfate (CARAFATE) 1 GM/10ML suspension Take 10 mLs (1 g total) 2 (two) times daily by mouth. Before meals. Not with other medications 420 mL  1  . torsemide (DEMADEX) 20 MG tablet Take 2 tablets (40 mg total) by mouth daily. 60 tablet 6  . tranexamic acid (LYSTEDA) 650 MG TABS tablet Take 2 tablets (1,300 mg total)  2 (two) times daily by mouth. 120 tablet 1   No current facility-administered medications for this visit.     Socially he's married has 3 children 3 grandchildren. Does not routinely exercise. In the past he had been walking 4 miles on a treadmill but has not done this in some time. There is no tobacco or alcohol use.  ROS General: Negative; No fevers, chills, or night sweats;  HEENT: Negative; No changes in vision or hearing, sinus congestion, difficulty swallowing Pulmonary: Negative; No cough, wheezing, shortness of breath, hemoptysis Cardiovascular: Negative; No chest pain, presyncope, syncope, palpitations Positive for Increasing leg swelling GI: Positive for polyps on colonoscopy; positive for fatty liver; recent GI bleeding and symptomatic anemia GU: Negative; No dysuria, hematuria, or difficulty voiding Musculoskeletal: Negative; no myalgias, joint pain, or weakness Hematologic/Oncology: Negative; no easy bruising, bleeding Endocrine: Positive for metabolic syndrome/mild diabetes; no heat/cold intolerance; Neuro: Negative; no changes in balance, headaches Skin: Negative; No rashes or skin lesions Psychiatric: Negative; No behavioral problems, depression Sleep: Negative; No snoring, daytime sleepiness, hypersomnolence, bruxism, restless legs, hypnogognic hallucinations, no cataplexy Other comprehensive 14 point system review is negative.   PE BP (!) 118/50   Pulse 79   Ht 5' 9"  (1.753 m)   Wt 243 lb (110.2 kg)   BMI 35.88 kg/m    Repeat blood pressure by me was 106/60 supine and 02/11/1956 standing  Wt Readings from Last 3 Encounters:  01/12/17 243 lb (110.2 kg)  01/11/17 240 lb 4.8 oz (109 kg)  01/04/17 239 lb 4 oz (108.5 kg)   General: Alert, oriented, no distress.  Skin: normal turgor, no  rashes, warm and dry HEENT: Normocephalic, atraumatic. Pupils equal round and reactive to light; sclera anicteric; extraocular muscles intact;  Nose without nasal septal hypertrophy Mouth/Parynx benign; Mallinpatti scale 3 Neck: No JVD, no carotid bruits; normal carotid upstroke Lungs: clear to ausculatation and percussion; no wheezing or rales Chest wall: without tenderness to palpitation Heart: PMI not displaced, RRR, s1 s2 normal, 1/6 systolic murmur, no diastolic murmur, no rubs, gallops, thrills, or heaves Abdomen: soft, nontender; no hepatosplenomehaly, BS+; abdominal aorta nontender and not dilated by palpation. Back: no CVA tenderness Pulses 2+ Musculoskeletal: full range of motion, normal strength, no joint deformities Extremities: no clubbing cyanosis or edema, Homan's sign negative  Neurologic: grossly nonfocal; Cranial nerves grossly wnl Psychologic: Normal mood and affect  ECG (independently read by me): Normal sinus rhythm at 79 bpm.  Incomplete right bundle branch block.  LVH by voltage.  Nonspecific ST changes.  QTc interval 509 ms.  August 2018 ECG (independently read by me): Normal sinus rhythm at 74 bpm.  Incomplete right bundle branch block.  First-degree AV block with a PR interval 28 ms.  QRS complex V1 V2.  QTc interval 499 ms.  March 2018 ECG (independently read by me): Normal sinus rhythm at 61 bpm.  First degree AV block with a PR interval at 224 ms.  Incomplete right bundle branch block.  December 2017 ECG (independently read by me): Normal sinus rhythm at 63 bpm with first-degree AV block.  PR interval 222 ms.  Incomplete right bundle branch block.  June 2017 ECG (independently read by me): Normal sinus rhythm at 65 bpm with first-degree AV block with a PR interval of 214 ms.  Mild RV conduction delay.  Poor anterior R-wave progression V1 through V4.  December 2016 ECG (independently read by me): Normal sinus rhythm at 66 bpm.  First-degree AV block with a PR  interval  at 224 ms.  June 2016 ECG (independently read by me): Normal sinus rhythm at 66. First-degree block with PR interval 28 ms.  Incomplete right bundle branch block.  QRS complex V1-3.   October 2015 ECG (independently read by me): Sinus rhythm with first-degree AV block.  Incomplete right bundle branch block.  QTc interval 447 ms; first-degree AV block with a PR interval of 220 ms  03/19/2013 ECG (independently read by me): Normal sinus rhythm at 69 beats per minute. No ectopy. Borderline first-degree block with a PR interval of 216 ms.  Prior ECG of July 18,2014:  Sinus rhythm at 57 beats per minute. Borderline first-degree block with a period of 1214 ms. No significant ST abnormalities.  LABS: BMP Latest Ref Rng & Units 01/11/2017 12/14/2016 11/30/2016  Glucose 70 - 140 mg/dl 140 108 101(H)  BUN 7.0 - 26.0 mg/dL 17.7 14.4 16  Creatinine 0.7 - 1.3 mg/dL 1.4(H) 1.2 1.21  BUN/Creat Ratio 10 - 24 - - -  Sodium 136 - 145 mEq/L 136 134(L) 136  Potassium 3.5 - 5.1 mEq/L 4.1 4.2 3.8  Chloride 101 - 111 mmol/L - - 105  CO2 22 - 29 mEq/L 20(L) 22 23  Calcium 8.4 - 10.4 mg/dL 8.5 8.8 8.4(L)   Hepatic Function Latest Ref Rng & Units 01/11/2017 12/14/2016 11/30/2016  Total Protein 6.4 - 8.3 g/dL 6.6 6.4 6.2(L)  Albumin 3.5 - 5.0 g/dL 3.0(L) 2.7(L) 2.9(L)  AST 5 - 34 U/L 34 34 33  ALT 0 - 55 U/L 27 25 24   Alk Phosphatase 40 - 150 U/L 92 98 97  Total Bilirubin 0.20 - 1.20 mg/dL 4.33(HH) 3.68(HH) 3.7(H)  Bilirubin, Direct 0.1 - 0.5 mg/dL - - -   CBC Latest Ref Rng & Units 01/11/2017 12/28/2016 12/14/2016  WBC 4.0 - 10.3 10e3/uL 4.6 5.6 5.0  Hemoglobin 13.0 - 17.1 g/dL 7.5(L) 9.0(L) 7.9(L)  Hematocrit 38.4 - 49.9 % 24.1(L) 28.9(L) 25.5(L)  Platelets 140 - 400 10e3/uL 125(L) 115(L) 119(L)   Lab Results  Component Value Date   MCV 86.4 01/11/2017   MCV 90.6 12/28/2016   MCV 84.2 12/14/2016   Lab Results  Component Value Date   TSH 1.19 05/04/2016   Lab Results  Component Value Date     HGBA1C 5.5 01/04/2017   Lipid Panel     Component Value Date/Time   CHOL 180 09/29/2016 1118   CHOL 135 11/29/2013 0940   TRIG 69.0 09/29/2016 1118   TRIG 177 (H) 11/29/2013 0940   HDL 45.00 09/29/2016 1118   HDL 34 (L) 11/29/2013 0940   CHOLHDL 4 09/29/2016 1118   VLDL 13.8 09/29/2016 1118   LDLCALC 121 (H) 09/29/2016 1118   LDLCALC 66 11/29/2013 0940     RADIOLOGY: No results found.  IMPRESSION:  1. Hyperlipidemia with target LDL less than 70   2. Coronary artery disease involving native coronary artery of native heart without angina pectoris   3. Hx of CABG   4. Essential hypertension   5. Abdominal aortic aneurysm without rupture ([place])   6. Aortic stenosis, mild   7. Iron deficiency anemia due to chronic blood loss   8. Hyperbilirubinemia     ASSESSMENT AND PLAN: William [name] is a 66 year old gentleman with a history of moderate obesity, who is  status post CABG revascularization surgery x4 in February 2008 . His nuclear perfusion study in December 2013 showed fairly normal perfusion without scar or ischemia. A five-year follow-up study in March 2018 continued to be low  risk demonstrating normal perfusion and function.. He has significant mixed hyperlipidemia and in the past has been documented to have a preponderance of small dense LDL particles and significantly increased triglycerides. Remotely he initially tolerated combination therapy with Niaspan 2000 mg and Crestor 20 mg without side effects. He developed  LFT elevation and his statin and niacin were discontinued.  Subsequent LFT studies have shown improvement but with residual mild  transaminase elevation.  He also has had issues with LFT elevation with pravastatin and Lipitor in addition to Crestor.  His most recent lipid studies February 2018 were significantly elevated with a TC of 204 and LDL cholesterol 151, which was improved from December 2016 when his LDL was 184.  Had initiated the process for PCSK9 inhibition  with our pharmacist.  He initially was read denied by insurance because of reported lack of absolute contraindication to statin therapy.  When I last saw him I recommended a rechallenge with rosuvastatin 5 mg as once a week for 8 weeks.  When his LDL was 1:15 with plans for follow-up LFTs.  He has continued to have problems with GI bleed and has undergone extensive evaluation of his large and small bowel.  He's hemoglobin has continued to drop and he will be undergoing repeat pack red blood cell transfusion.  His blood pressure today is stable but low.  I am reducing spironolactone to 12.5 mg from 25 mg daily.  I have recommended he follow-up with his GI physician, particularly with reference to his significant increased bilirubin, which is now 4.33 as of yesterday.  He may require evaluation with a hepatologist.  He is not having any anginal symptoms.  We will repeat his lipid studies with the reinstitution of low-dose statin therapy and will resubmit to his insurance after February 07, 2017 for  potential treatment with Repatha particular since the list cost is now significantly lower.  He has a previously documented stable aortic aneurysm.  He has mild aortic valve stenosis with normal LV function on his last echo.  I will see him in 6 months for follow-up evaluation.  Troy Sine, MD, [facility]  01/17/2017 12:33 PM

## 2017-01-14 ENCOUNTER — Ambulatory Visit: Payer: 59

## 2017-01-14 DIAGNOSIS — D649 Anemia, unspecified: Secondary | ICD-10-CM | POA: Diagnosis not present

## 2017-01-14 DIAGNOSIS — K922 Gastrointestinal hemorrhage, unspecified: Secondary | ICD-10-CM | POA: Diagnosis not present

## 2017-01-14 MED ORDER — ACETAMINOPHEN 325 MG PO TABS
ORAL_TABLET | ORAL | Status: AC
  Filled 2017-01-14: qty 2

## 2017-01-14 MED ORDER — DIPHENHYDRAMINE HCL 25 MG PO CAPS
ORAL_CAPSULE | ORAL | Status: AC
Start: 2017-01-14 — End: ?
  Filled 2017-01-14: qty 1

## 2017-01-14 MED ORDER — ACETAMINOPHEN 325 MG PO TABS
650.0000 mg | ORAL_TABLET | Freq: Once | ORAL | Status: AC
  Administered 2017-01-14: 650 mg via ORAL

## 2017-01-14 MED ORDER — SODIUM CHLORIDE 0.9 % IV SOLN
250.0000 mL | Freq: Once | INTRAVENOUS | Status: AC
  Administered 2017-01-14: 250 mL via INTRAVENOUS

## 2017-01-14 MED ORDER — DIPHENHYDRAMINE HCL 25 MG PO CAPS
25.0000 mg | ORAL_CAPSULE | Freq: Once | ORAL | Status: AC
  Administered 2017-01-14: 25 mg via ORAL

## 2017-01-14 NOTE — Progress Notes (Signed)
IV attempt to Right hand and right inner upper forearm unsuccessful by this nurse at [facility] and 8:21 respectively.  Second nurse accessed peripheral IV.  Patient tolerated well.

## 2017-01-14 NOTE — Patient Instructions (Signed)

## 2017-01-16 LAB — BPAM RBC
Blood Product Expiration Date: 201901052359
ISSUE DATE / TIME: 201812080902
Unit Type and Rh: 5100

## 2017-01-16 LAB — TYPE AND SCREEN
ABO/RH(D): O POS
Antibody Screen: NEGATIVE
Unit division: 0

## 2017-01-17 ENCOUNTER — Encounter: Payer: Self-pay | Admitting: Cardiovascular Disease

## 2017-01-17 ENCOUNTER — Encounter: Payer: Self-pay | Admitting: Family Medicine

## 2017-01-18 MED FILL — CARAFATE 1 GM/10 ML SUSP: 1 | 21 days supply | Qty: 420 | Fill #1

## 2017-01-18 MED FILL — TRANEXAMIC ACID 650 MG TAB: 650 | 30 days supply | Qty: 120 | Fill #1

## 2017-01-18 NOTE — Telephone Encounter (Signed)
Please advise. Thanks.  

## 2017-01-20 MED FILL — SPIRONOLACTONE 25 MG TABLET: 25 | 30 days supply | Qty: 30 | Fill #6

## 2017-01-20 MED FILL — TORSEMIDE 20 MG TABLET: 20 | 30 days supply | Qty: 60 | Fill #6

## 2017-01-23 ENCOUNTER — Encounter: Payer: Self-pay | Admitting: Family Medicine

## 2017-01-23 ENCOUNTER — Other Ambulatory Visit: Payer: Self-pay | Admitting: Gastroenterology

## 2017-01-23 DIAGNOSIS — R195 Other fecal abnormalities: Secondary | ICD-10-CM | POA: Diagnosis not present

## 2017-01-23 DIAGNOSIS — D509 Iron deficiency anemia, unspecified: Secondary | ICD-10-CM | POA: Diagnosis not present

## 2017-01-23 DIAGNOSIS — K7460 Unspecified cirrhosis of liver: Secondary | ICD-10-CM | POA: Diagnosis not present

## 2017-01-23 DIAGNOSIS — K922 Gastrointestinal hemorrhage, unspecified: Secondary | ICD-10-CM

## 2017-01-23 NOTE — Telephone Encounter (Signed)
FYI

## 2017-01-24 ENCOUNTER — Other Ambulatory Visit: Payer: Self-pay

## 2017-01-24 ENCOUNTER — Telehealth: Payer: Self-pay | Admitting: Cardiovascular Disease

## 2017-01-24 ENCOUNTER — Telehealth: Payer: Self-pay

## 2017-01-24 DIAGNOSIS — K922 Gastrointestinal hemorrhage, unspecified: Secondary | ICD-10-CM

## 2017-01-24 DIAGNOSIS — E785 Hyperlipidemia, unspecified: Secondary | ICD-10-CM

## 2017-01-24 NOTE — Telephone Encounter (Signed)
New message   William [name] is calling form medical oncology at the [facility] and she want to confirm lab orders   Pt is going to have it done there on Wednesday 01/25/2017  Please feel free to fax orders to Fax [phone]

## 2017-01-24 NOTE — Telephone Encounter (Signed)
Pt wife called yesterday (12/17) stating that pt's cardiologist wanted to add a lipid panel. Spoke with pt this AM to confirm who pt cardiologist was. Called the office of Dr. Shelva Majestic and received fax from [place] office to add lipid panel for labs on 12/19. Called pt to remind him that lab work will require him to be fasting. Cardiology office also notified that additional order had to be placed for lipid panel per [facility] lab requirements for processing. Secretary, Tillie Rung, said MD to be made aware. Return fax number for labs on 12/19 [phone].

## 2017-01-24 NOTE — Telephone Encounter (Signed)
Lab order faxed to provided number.

## 2017-01-25 ENCOUNTER — Other Ambulatory Visit: Payer: Self-pay

## 2017-01-25 ENCOUNTER — Other Ambulatory Visit (HOSPITAL_BASED_OUTPATIENT_CLINIC_OR_DEPARTMENT_OTHER): Payer: 59

## 2017-01-25 DIAGNOSIS — K922 Gastrointestinal hemorrhage, unspecified: Secondary | ICD-10-CM

## 2017-01-25 DIAGNOSIS — D500 Iron deficiency anemia secondary to blood loss (chronic): Secondary | ICD-10-CM

## 2017-01-25 DIAGNOSIS — E785 Hyperlipidemia, unspecified: Secondary | ICD-10-CM | POA: Diagnosis not present

## 2017-01-25 LAB — COMPREHENSIVE METABOLIC PANEL
ALBUMIN: 2.8 g/dL — AB (ref 3.5–5.0)
ALT: 24 U/L (ref 0–55)
AST: 35 U/L — AB (ref 5–34)
Alkaline Phosphatase: 88 U/L (ref 40–150)
Anion Gap: 7 mEq/L (ref 3–11)
BUN: 14 mg/dL (ref 7.0–26.0)
CALCIUM: 8.5 mg/dL (ref 8.4–10.4)
CHLORIDE: 107 meq/L (ref 98–109)
CO2: 25 mEq/L (ref 22–29)
CREATININE: 1.4 mg/dL — AB (ref 0.7–1.3)
EGFR: 53 mL/min/{1.73_m2} — ABNORMAL LOW (ref >60)
GLUCOSE: 110 mg/dL (ref 70–140)
Potassium: 4.3 mEq/L (ref 3.5–5.1)
SODIUM: 138 meq/L (ref 136–145)
Total Bilirubin: 4.76 mg/dL (ref 0.20–1.20)
Total Protein: 6.1 g/dL — ABNORMAL LOW (ref 6.4–8.3)

## 2017-01-25 LAB — CBC WITH DIFFERENTIAL/PLATELET
BASO%: 1.9 % (ref 0.0–2.0)
BASOS ABS: 0.1 10*3/uL (ref 0.0–0.1)
EOS%: 8.6 % — ABNORMAL HIGH (ref 0.0–7.0)
Eosinophils Absolute: 0.5 10*3/uL (ref 0.0–0.5)
HEMATOCRIT: 27.1 % — AB (ref 38.4–49.9)
HGB: 8.3 g/dL — ABNORMAL LOW (ref 13.0–17.1)
LYMPH%: 18.3 % (ref 14.0–49.0)
MCH: 28.1 pg (ref 27.2–33.4)
MCHC: 30.6 g/dL — AB (ref 32.0–36.0)
MCV: 91.9 fL (ref 79.3–98.0)
MONO#: 0.9 10*3/uL (ref 0.1–0.9)
MONO%: 16.5 % — ABNORMAL HIGH (ref 0.0–14.0)
NEUT#: 2.9 10*3/uL (ref 1.5–6.5)
NEUT%: 54.7 % (ref 39.0–75.0)
PLATELETS: 113 10*3/uL — AB (ref 140–400)
RBC: 2.95 10*6/uL — AB (ref 4.20–5.82)
RDW: 20.5 % — ABNORMAL HIGH (ref 11.0–14.6)
WBC: 5.3 10*3/uL (ref 4.0–10.3)
lymph#: 1 10*3/uL (ref 0.9–3.3)

## 2017-01-25 LAB — IRON AND TIBC
%SAT: 7 % — ABNORMAL LOW (ref 20–55)
Iron: 28 ug/dL — ABNORMAL LOW (ref 42–163)
TIBC: 374 ug/dL (ref 202–409)
UIBC: 346 ug/dL (ref 117–376)

## 2017-01-25 LAB — FERRITIN: FERRITIN: 126 ng/mL (ref 22–316)

## 2017-01-26 LAB — LIPID PANEL
CHOL/HDL RATIO: 3.4 ratio (ref 0.0–5.0)
Cholesterol, Total: 162 mg/dL (ref 100–199)
HDL: 47 mg/dL (ref >39)
LDL CALC: 100 mg/dL — AB (ref 0–99)
Triglycerides: 76 mg/dL (ref 0–149)
VLDL CHOLESTEROL CAL: 15 mg/dL (ref 5–40)

## 2017-01-26 LAB — SPECIMEN STATUS REPORT

## 2017-01-27 ENCOUNTER — Ambulatory Visit (HOSPITAL_COMMUNITY)
Admission: RE | Admit: 2017-01-27 | Discharge: 2017-01-27 | Disposition: A | Discharge status: Home / Self Care | Payer: 59 | Source: Ambulatory Visit | Attending: Gastroenterology | Admitting: Gastroenterology

## 2017-01-27 DIAGNOSIS — K922 Gastrointestinal hemorrhage, unspecified: Secondary | ICD-10-CM | POA: Diagnosis not present

## 2017-01-27 DIAGNOSIS — K625 Hemorrhage of anus and rectum: Secondary | ICD-10-CM | POA: Diagnosis not present

## 2017-01-27 DIAGNOSIS — K7460 Unspecified cirrhosis of liver: Secondary | ICD-10-CM | POA: Diagnosis not present

## 2017-01-27 HISTORY — PX: OTHER SURGICAL HISTORY: SHX169

## 2017-01-27 MED ORDER — TECHNETIUM TC 99M-LABELED RED BLOOD CELLS IV KIT
26.8000 | PACK | Freq: Once | INTRAVENOUS | Status: AC | PRN
  Administered 2017-01-27: 26.8 via INTRAVENOUS

## 2017-02-01 ENCOUNTER — Encounter: Payer: Self-pay | Admitting: Family Medicine

## 2017-02-02 MED FILL — POTASSIUM CL ER 20 MEQ TABL: 20 | 30 days supply | Qty: 60 | Fill #6

## 2017-02-03 ENCOUNTER — Other Ambulatory Visit: Payer: Self-pay | Admitting: Gastroenterology

## 2017-02-08 ENCOUNTER — Other Ambulatory Visit: Payer: Self-pay

## 2017-02-08 ENCOUNTER — Ambulatory Visit (HOSPITAL_COMMUNITY)
Admission: RE | Admit: 2017-02-08 | Discharge: 2017-02-08 | Disposition: A | Discharge status: Home / Self Care | Payer: 59 | Source: Ambulatory Visit | Attending: Hematology | Admitting: Hematology

## 2017-02-08 ENCOUNTER — Ambulatory Visit (HOSPITAL_BASED_OUTPATIENT_CLINIC_OR_DEPARTMENT_OTHER): Payer: 59

## 2017-02-08 ENCOUNTER — Other Ambulatory Visit (HOSPITAL_BASED_OUTPATIENT_CLINIC_OR_DEPARTMENT_OTHER): Payer: 59

## 2017-02-08 VITALS — BP 121/59 | HR 77 | Temp 98.7°F | Resp 17

## 2017-02-08 DIAGNOSIS — Z8719 Personal history of other diseases of the digestive system: Secondary | ICD-10-CM

## 2017-02-08 DIAGNOSIS — D500 Iron deficiency anemia secondary to blood loss (chronic): Secondary | ICD-10-CM

## 2017-02-08 DIAGNOSIS — K922 Gastrointestinal hemorrhage, unspecified: Secondary | ICD-10-CM

## 2017-02-08 DIAGNOSIS — D649 Anemia, unspecified: Secondary | ICD-10-CM | POA: Insufficient documentation

## 2017-02-08 DIAGNOSIS — D62 Acute posthemorrhagic anemia: Secondary | ICD-10-CM

## 2017-02-08 LAB — CBC & DIFF AND RETIC
BASO%: 1.5 % (ref 0.0–2.0)
Basophils Absolute: 0.1 10*3/uL (ref 0.0–0.1)
EOS%: 10.6 % — ABNORMAL HIGH (ref 0.0–7.0)
Eosinophils Absolute: 0.5 10*3/uL (ref 0.0–0.5)
HCT: 21.7 % — ABNORMAL LOW (ref 38.4–49.9)
HGB: 6.9 g/dL — CL (ref 13.0–17.1)
IMMATURE RETIC FRACT: 11.4 % — AB (ref 3.00–10.60)
LYMPH%: 14.3 % (ref 14.0–49.0)
MCH: 26 pg — AB (ref 27.2–33.4)
MCHC: 31.6 g/dL — AB (ref 32.0–36.0)
MCV: 82.5 fL (ref 79.3–98.0)
MONO#: 0.8 10*3/uL (ref 0.1–0.9)
MONO%: 14.7 % — AB (ref 0.0–14.0)
NEUT#: 3 10*3/uL (ref 1.5–6.5)
NEUT%: 58.9 % (ref 39.0–75.0)
Platelets: 116 10*3/uL — ABNORMAL LOW (ref 140–400)
RBC: 2.64 10*6/uL — ABNORMAL LOW (ref 4.20–5.82)
RDW: 19.9 % — ABNORMAL HIGH (ref 11.0–14.6)
Retic %: 2.8 % — ABNORMAL HIGH (ref 0.80–1.80)
Retic Ct Abs: 73.92 10*3/uL (ref 34.80–93.90)
WBC: 5.1 10*3/uL (ref 4.0–10.3)
lymph#: 0.7 10*3/uL — ABNORMAL LOW (ref 0.9–3.3)

## 2017-02-08 LAB — COMPREHENSIVE METABOLIC PANEL
ALBUMIN: 2.7 g/dL — AB (ref 3.5–5.0)
ALT: 22 U/L (ref 0–55)
AST: 26 U/L (ref 5–34)
Alkaline Phosphatase: 97 U/L (ref 40–150)
Anion Gap: 7 mEq/L (ref 3–11)
BUN: 13.5 mg/dL (ref 7.0–26.0)
CALCIUM: 8.5 mg/dL (ref 8.4–10.4)
CO2: 22 mEq/L (ref 22–29)
Chloride: 108 mEq/L (ref 98–109)
Creatinine: 1.2 mg/dL (ref 0.7–1.3)
EGFR: >60 mL/min/{1.73_m2} (ref >60)
Glucose: 130 mg/dl (ref 70–140)
POTASSIUM: 4.1 meq/L (ref 3.5–5.1)
Sodium: 138 mEq/L (ref 136–145)
Total Bilirubin: 4.33 mg/dL (ref 0.20–1.20)
Total Protein: 5.9 g/dL — ABNORMAL LOW (ref 6.4–8.3)

## 2017-02-08 LAB — PREPARE RBC (CROSSMATCH)

## 2017-02-08 LAB — IRON AND TIBC
%SAT: 5 % — ABNORMAL LOW (ref 20–55)
Iron: 20 ug/dL — ABNORMAL LOW (ref 42–163)
TIBC: 379 ug/dL (ref 202–409)
UIBC: 358 ug/dL (ref 117–376)

## 2017-02-08 LAB — FERRITIN: Ferritin: 26 ng/ml (ref 22–316)

## 2017-02-08 MED ORDER — SODIUM CHLORIDE 0.9 % IV SOLN
750.0000 mg | Freq: Once | INTRAVENOUS | Status: AC
  Administered 2017-02-08: 750 mg via INTRAVENOUS
  Filled 2017-02-08: qty 15

## 2017-02-08 MED ORDER — HEPARIN SOD (PORK) LOCK FLUSH 100 UNIT/ML IV SOLN
500.0000 [IU] | Freq: Every day | INTRAVENOUS | Status: AC | PRN
  Filled 2017-02-08: qty 5

## 2017-02-08 MED ORDER — SODIUM CHLORIDE 0.9% FLUSH
10.0000 mL | INTRAVENOUS | Status: DC | PRN
  Filled 2017-02-08: qty 10

## 2017-02-08 MED ORDER — DIPHENHYDRAMINE HCL 25 MG PO TABS
25.0000 mg | ORAL_TABLET | Freq: Once | ORAL | Status: AC
  Administered 2017-02-08: 25 mg via ORAL
  Filled 2017-02-08: qty 1

## 2017-02-08 MED ORDER — SODIUM CHLORIDE 0.9 % IV SOLN
250.0000 mL | Freq: Once | INTRAVENOUS | Status: AC
  Administered 2017-02-08: 250 mL via INTRAVENOUS

## 2017-02-08 MED ORDER — ACETAMINOPHEN 325 MG PO TABS
ORAL_TABLET | ORAL | Status: AC
  Filled 2017-02-08: qty 2

## 2017-02-08 MED ORDER — SODIUM CHLORIDE 0.9% FLUSH
10.0000 mL | INTRAVENOUS | Status: AC | PRN
  Filled 2017-02-08: qty 10

## 2017-02-08 MED ORDER — ACETAMINOPHEN 325 MG PO TABS
650.0000 mg | ORAL_TABLET | Freq: Once | ORAL | Status: AC
  Administered 2017-02-08: 650 mg via ORAL

## 2017-02-08 MED ORDER — DIPHENHYDRAMINE HCL 25 MG PO CAPS
ORAL_CAPSULE | ORAL | Status: AC
Start: 2017-02-08 — End: ?
  Filled 2017-02-08: qty 1

## 2017-02-08 NOTE — Patient Instructions (Signed)
Ferric carboxymaltose injection What is this medicine? FERRIC CARBOXYMALTOSE (ferr-ik car-box-ee-mol-toes) is an iron complex. Iron is used to make healthy red blood cells, which carry oxygen and nutrients throughout the body. This medicine is used to treat anemia in people with chronic kidney disease or people who cannot take iron by mouth. This medicine may be used for other purposes; ask your health care provider or pharmacist if you have questions. COMMON BRAND NAME(S): Injectafer What should I tell my health care provider before I take this medicine? They need to know if you have any of these conditions: -anemia not caused by low iron levels -high levels of iron in the blood -liver disease -an unusual or allergic reaction to iron, other medicines, foods, dyes, or preservatives -pregnant or trying to get pregnant -breast-feeding How should I use this medicine? This medicine is for infusion into a vein. It is given by a health care professional in a hospital or clinic setting. Talk to your pediatrician regarding the use of this medicine in children. Special care may be needed. Overdosage: If you think you have taken too much of this medicine contact a poison control center or emergency room at once. NOTE: This medicine is only for you. Do not share this medicine with others. What if I miss a dose? It is important not to miss your dose. Call your doctor or health care professional if you are unable to keep an appointment. What may interact with this medicine? Do not take this medicine with any of the following medications: -deferoxamine -dimercaprol -other iron products This medicine may also interact with the following medications: -chloramphenicol -deferasirox This list may not describe all possible interactions. Give your health care provider a list of all the medicines, herbs, non-prescription drugs, or dietary supplements you use. Also tell them if you smoke, drink alcohol, or use  illegal drugs. Some items may interact with your medicine. What should I watch for while using this medicine? Visit your doctor or health care professional regularly. Tell your doctor if your symptoms do not start to get better or if they get worse. You may need blood work done while you are taking this medicine. You may need to follow a special diet. Talk to your doctor. Foods that contain iron include: whole grains/cereals, dried fruits, beans, or peas, leafy green vegetables, and organ meats (liver, kidney). What side effects may I notice from receiving this medicine? Side effects that you should report to your doctor or health care professional as soon as possible: -allergic reactions like skin rash, itching or hives, swelling of the face, lips, or tongue -breathing problems -changes in blood pressure -feeling faint or lightheaded, falls -flushing, sweating, or hot feelings Side effects that usually do not require medical attention (report to your doctor or health care professional if they continue or are bothersome): -changes in taste -constipation -dizziness -headache -nausea -pain, redness, or irritation at site where injected -vomiting This list may not describe all possible side effects. Call your doctor for medical advice about side effects. You may report side effects to FDA at 1-800-FDA-1088. Where should I keep my medicine? This drug is given in a hospital or clinic and will not be stored at home. NOTE: This sheet is a summary. It may not cover all possible information. If you have questions about this medicine, talk to your doctor, pharmacist, or health care provider.  2018 Elsevier/Gold Standard (2015-02-26 11:20:47)    Blood Transfusion, Care After This sheet gives you information about how to  care for yourself after your procedure. Your doctor may also give you more specific instructions. If you have problems or questions, contact your doctor. Follow these instructions  at home:  Take over-the-counter and prescription medicines only as told by your doctor.  Go back to your normal activities as told by your doctor.  Follow instructions from your doctor about how to take care of the area where an IV tube was put into your vein (insertion site). Make sure you: ? Wash your hands with soap and water before you change your bandage (dressing). If there is no soap and water, use hand sanitizer. ? Change your bandage as told by your doctor.  Check your IV insertion site every day for signs of infection. Check for: ? More redness, swelling, or pain. ? More fluid or blood. ? Warmth. ? Pus or a bad smell. Contact a doctor if:  You have more redness, swelling, or pain around the IV insertion site..  You have more fluid or blood coming from the IV insertion site.  Your IV insertion site feels warm to the touch.  You have pus or a bad smell coming from the IV insertion site.  Your pee (urine) turns pink, red, or brown.  You feel weak after doing your normal activities. Get help right away if:  You have signs of a serious allergic or body defense (immune) system reaction, including: ? Itchiness. ? Hives. ? Trouble breathing. ? Anxiety. ? Pain in your chest or lower back. ? Fever, flushing, and chills. ? Fast pulse. ? Rash. ? Watery poop (diarrhea). ? Throwing up (vomiting). ? Dark pee. ? Serious headache. ? Dizziness. ? Stiff neck. ? Yellow color in your face or the white parts of your eyes (jaundice). Summary  After a blood transfusion, return to your normal activities as told by your doctor.  Every day, check for signs of infection where the IV tube was put into your vein.  Some signs of infection are warm skin, more redness and pain, more fluid or blood, and pus or a bad smell where the needle went in.  Contact your doctor if you feel weak or have any unusual symptoms. This information is not intended to replace advice given to you by your  health care provider. Make sure you discuss any questions you have with your health care provider. Document Released: 02/14/2014 Document Revised: 09/18/2015 Document Reviewed: 09/18/2015 Elsevier Interactive Patient Education  2017 [employer].

## 2017-02-09 LAB — TYPE AND SCREEN
ABO/RH(D): O POS
ANTIBODY SCREEN: NEGATIVE
Unit division: 0
Unit division: 0

## 2017-02-09 LAB — BPAM RBC
BLOOD PRODUCT EXPIRATION DATE: 201901192359
BLOOD PRODUCT EXPIRATION DATE: 201901282359
ISSUE DATE / TIME: 201901021111
ISSUE DATE / TIME: 201901021111
UNIT TYPE AND RH: 5100
UNIT TYPE AND RH: 5100

## 2017-02-10 ENCOUNTER — Other Ambulatory Visit: Payer: 59

## 2017-02-10 ENCOUNTER — Other Ambulatory Visit: Payer: Self-pay

## 2017-02-10 ENCOUNTER — Telehealth: Payer: Self-pay | Admitting: Hematology

## 2017-02-10 ENCOUNTER — Ambulatory Visit (HOSPITAL_BASED_OUTPATIENT_CLINIC_OR_DEPARTMENT_OTHER): Payer: 59 | Admitting: Hematology

## 2017-02-10 ENCOUNTER — Ambulatory Visit (HOSPITAL_BASED_OUTPATIENT_CLINIC_OR_DEPARTMENT_OTHER): Payer: 59

## 2017-02-10 VITALS — BP 132/68 | HR 75 | Temp 97.7°F | Resp 18 | Ht 68.0 in | Wt 242.3 lb

## 2017-02-10 DIAGNOSIS — K922 Gastrointestinal hemorrhage, unspecified: Secondary | ICD-10-CM | POA: Diagnosis not present

## 2017-02-10 DIAGNOSIS — D500 Iron deficiency anemia secondary to blood loss (chronic): Secondary | ICD-10-CM | POA: Diagnosis not present

## 2017-02-10 LAB — COMPREHENSIVE METABOLIC PANEL
ALBUMIN: 2.9 g/dL — AB (ref 3.5–5.0)
ALK PHOS: 108 U/L (ref 40–150)
ALT: 23 U/L (ref 0–55)
AST: 30 U/L (ref 5–34)
Anion Gap: 6 mEq/L (ref 3–11)
BUN: 10.4 mg/dL (ref 7.0–26.0)
CALCIUM: 8.9 mg/dL (ref 8.4–10.4)
CO2: 24 mEq/L (ref 22–29)
Chloride: 109 mEq/L (ref 98–109)
Creatinine: 1.3 mg/dL (ref 0.7–1.3)
EGFR: 59 mL/min/{1.73_m2} — AB (ref >60)
GLUCOSE: 115 mg/dL (ref 70–140)
POTASSIUM: 3.9 meq/L (ref 3.5–5.1)
SODIUM: 139 meq/L (ref 136–145)
Total Bilirubin: 4.97 mg/dL (ref 0.20–1.20)
Total Protein: 6 g/dL — ABNORMAL LOW (ref 6.4–8.3)

## 2017-02-10 LAB — CBC WITH DIFFERENTIAL/PLATELET
BASO%: 0.9 % (ref 0.0–2.0)
BASOS ABS: 0.1 10*3/uL (ref 0.0–0.1)
EOS ABS: 0.7 10*3/uL — AB (ref 0.0–0.5)
EOS%: 9.5 % — ABNORMAL HIGH (ref 0.0–7.0)
HCT: 27.2 % — ABNORMAL LOW (ref 38.4–49.9)
HEMOGLOBIN: 8.7 g/dL — AB (ref 13.0–17.1)
LYMPH%: 15.1 % (ref 14.0–49.0)
MCH: 26.4 pg — AB (ref 27.2–33.4)
MCHC: 32.2 g/dL (ref 32.0–36.0)
MCV: 82.1 fL (ref 79.3–98.0)
MONO#: 1.2 10*3/uL — ABNORMAL HIGH (ref 0.1–0.9)
MONO%: 16.4 % — AB (ref 0.0–14.0)
NEUT#: 4.1 10*3/uL (ref 1.5–6.5)
NEUT%: 58.1 % (ref 39.0–75.0)
Platelets: 103 10*3/uL — ABNORMAL LOW (ref 140–400)
RBC: 3.31 10*6/uL — ABNORMAL LOW (ref 4.20–5.82)
RDW: 19 % — AB (ref 11.0–14.6)
WBC: 7.1 10*3/uL (ref 4.0–10.3)
lymph#: 1.1 10*3/uL (ref 0.9–3.3)

## 2017-02-10 NOTE — Telephone Encounter (Signed)
Gave avs and calendar for January and february °

## 2017-02-10 NOTE — Progress Notes (Signed)
HEMATOLOGY/ONCOLOGY CLINIC NOTE  Date of Service: 02/10/17  Patient Care Team: Tammi Sou, MD as PCP - General (Family Medicine) Juanita Craver, MD as Consulting Physician (Gastroenterology) Franchot Gallo, MD as Consulting Physician (Urology) Troy Sine, MD as Consulting Physician (Cardiology) Brunetta Genera, MD as Consulting Physician (Hematology)  CHIEF COMPLAINTS/PURPOSE OF CONSULTATION:  Chronic blood loss Anemia from GI bleeding.  HISTORY OF PRESENTING ILLNESS:   William [name] is a wonderful 67 y.o. male who has been referred to [place] by Dr .Anitra Lauth, Adrian Blackwater, MD for evaluation and management of Anemia.  Patient has a history of extensive medical comorbidities including liver cirrhosis related to [facility] with portal hypertension, esophageal varices and portal hypertensive gastropathy and splenomegaly, iron deficiency anemia, obesity, diabetes, AAA.  Patient was admitted in April 2018 with acute blood loss anemia and required transfusion of multiple units of PRBCs with iron profile suggestive of iron deficiency. No overt evidence of hemolysis noted. He had an EGD and colonoscopy that did not show any overt bleeding. Capsule endoscopy was unrevealing but the capsule past and only 27 minutes.  He follows with Dr. [name] who is his gastric oncologist.  Patient was again readmitted in June 2018 with symptomatically anemia and hemoglobin of 6.3. He underwent 4 units of PRBC transfusions again and was sent out on PPI and iron supplementation with the plan to follow-up with Dr. [name].  He was also given hematology referral. He had a repeat ultrasound of the abdomen on 07/29/2016 which showed significant increase in splenomegaly in 2 months suggesting significant portal hypertension versus some element of splenic sequestration.  He continues to note intermittent black stools.  Hemoglobin is stable and improved today and is up to 11.1. Patient notes he feels better  after his transfusions. Has not noted lower GI bleeding at this time. We discuss any other workup to rule out less likely other possibilities of his anemia.  INTERVAL HISTORY  Patient is here for f/u of his anemia that is primarily related to ongoing issues with GI bleeding. He states he has been doing well overall. He saw Dr. [name] recently and reports tag red blood cell was negative. His wife reports after Christmas, his stools became black but he states they have been getting lighter as of recently. Hgb is 8.7 today, 02/10/2017. Did receive 2 units of PRBC a couple of days ago. He notes that he has been scheduled for a capsule endoscopy with Dr Ronalee Red. Still taking lysteda.  On ROS, pt reports fatigue and denies abdominal pain, fever, chills, night sweats, dizziness and any other accompanying symptoms.  MEDICAL HISTORY:  Past Medical History:  Diagnosis Date  . AAA (abdominal aortic aneurysm) ([place]) 03/2014   3.2 cm on MR abd.  F/u aortic u/s 06/2014 showed 3.0 x 3.1 cm AAA: recheck 2 yrs recommended (followed by cardiologist).  Minimal growth on 10/2016 CT abd done for epig pain.  Repeat u/s 10/2018.  [name] Anemia   . Bilateral renal cysts    simple (03/2014 MRI)  . CAD (coronary artery disease)   . Cholelithiases 2018   asymptomatic  . Chronic diastolic heart failure ([place])   . Cirrhosis ([place]) 05/2016   secondary to NASH; most recent u/s abd 07/2016 showed splenomegaly.  Hx of portal HTN changes with mild ascites and splenomegaly.  . Diabetes mellitus with complication ([place]) 68/3419   A1c 6.8%  . History of blood transfusion 2018 X 4 dates   "low blood" (09/21/2016)  .  Hyperlipemia, mixed    elevated LFTs when on statins.    . Hypertension    Cr bump 04/01/16 so I changed benicar-hct to benicar plain and added amlodipine 5 mg.  . Iron deficiency anemia 05/2016   Acute blood loss anemia: hospitalized, required transfusion x 3 U: colonoscopy and capsule study unrevealing.  Readmitted 6/22-6/25,  2018 for symptomatic anemia again, got transfused x 4U, EGD with grd I esoph varices and port hyt gastropathy.  W/u for ? hemolytic anemia to be pursued by hematologist as outpt.  Appt with Dr. Cristi Loron at [facility] 09/13/16.  . Microscopic hematuria    Eval unremarkable by Dr. Eulogio Ditch.  [name] NASH (nonalcoholic steatohepatitis)    Fatty liver on MR abd 03/2014, + hx of elevated transaminases and bili: followed by Dr. [name].  . Obesity   . Spleen enlarged     SURGICAL HISTORY: Past Surgical History:  Procedure Laterality Date  . CARDIAC CATHETERIZATION    . CARDIOVASCULAR STRESS TEST  01/17/2012; 05/04/16   Normal stress nuclear study x 2 (2018--Normal perfusion. LVEF 66% with normal wall motion. This is a low risk study).  . CERVICAL DISCECTOMY  1992  . COLONOSCOPY N/A 05/27/2016   No site/explanation for blood loss found.  Erythematous mucosa in cecum and ascending colon--Cecal bx: normal.  Procedure: COLONOSCOPY;  Surgeon: Carol Ada, MD;  Location: [facility] ENDOSCOPY;  Service: Endoscopy;  Laterality: N/A;  . COLONOSCOPY W/ POLYPECTOMY  09/10/2014   Polypectomy x 2: recall 5 yrs (Dr. [name]).  . CORONARY ANGIOPLASTY    . CORONARY ARTERY BYPASS GRAFT  03/2006  . ENTEROSCOPY N/A 07/31/2016   Procedure: ENTEROSCOPY;  Surgeon: Ladene Artist, MD;  Location: [facility] ENDOSCOPY;  Service: Endoscopy;  Laterality: N/A;  . ENTEROSCOPY N/A 12/02/2016   Procedure: ENTEROSCOPY;  Surgeon: Carol Ada, MD;  Location: [facility] ENDOSCOPY;  Service: Endoscopy;  Laterality: N/A;  . ESOPHAGOGASTRODUODENOSCOPY N/A 05/25/2016   No site/explanation for blood loss found.  Procedure: ESOPHAGOGASTRODUODENOSCOPY (EGD);  Surgeon: Juanita Craver, MD;  Location[facility] ENDOSCOPY;  Service: Endoscopy;  Laterality: N/A;  . ESOPHAGOGASTRODUODENOSCOPY N/A 09/23/2016   Procedure: ESOPHAGOGASTRODUODENOSCOPY (EGD);  Surgeon: Carol Ada, MD;  Location: [place];  Service: Endoscopy;  Laterality: N/A;  . GIVENS CAPSULE STUDY N/A 05/27/2016   No  source identified.  Repeat 10/2016--results pending.  Procedure: GIVENS CAPSULE STUDY;  Surgeon: Carol Ada, MD;  Location: [facility] ENDOSCOPY;  Service: Endoscopy;  Laterality: N/A;  . GIVENS CAPSULE STUDY N/A 10/15/2016   Procedure: GIVENS CAPSULE STUDY;  Surgeon: Carol Ada, MD;  Location: [facility] ENDOSCOPY;  Service: Endoscopy;  Laterality: N/A;  . NECK SURGERY  1991  . NM GI BLOOD LOSS  01/27/2017   NEGATIVE  . SMALL BOWEL ENTEROSCOPY  10/2016   ([facility], Dr. [name]: Double balloon enteroscopy--to the level of proximal ileum) jejunal polyps- which were removed--not bleeding & benign.  No source of bleeding was identified.  . TRANSTHORACIC ECHOCARDIOGRAM  05/25/2016   EF 60%, normal wall motion, grd II DD, mild aortic stenosis, dilated aortic root and ascending aorta.  [name] VASECTOMY  1977    SOCIAL HISTORY: Social History   Socioeconomic History  . Marital status: Married    Spouse name: Not on file  . Number of children: Not on file  . Years of education: Not on file  . Highest education level: Not on file  Social Needs  . Financial resource strain: Not on file  . Food insecurity - worry: Not on file  . Food insecurity - inability:  Not on file  . Transportation needs - medical: Not on file  . Transportation needs - non-medical: Not on file  Occupational History  . Not on file  Tobacco Use  . Smoking status: Former Smoker    Types: Cigarettes  . Smokeless tobacco: Never Used  . Tobacco comment: QUIT I 1983  Substance and Sexual Activity  . Alcohol use: No  . Drug use: No  . Sexual activity: Yes  Other Topics Concern  . Not on file  Social History Narrative   Married, 3 grown children, 3 GCs.   Educ: 10th grade.   Occupation: Retired Brewing technologist.   Tob: quit 1983, smoked about 20 pack-yr hx prior.   No alcohol.    FAMILY HISTORY: Family History  Problem Relation Age of Onset  . Hypertension Mother   . Cancer - Other Mother        liver cancer  . Heart disease Father   .  Heart attack Father   . Cancer - Lung Father   . Diabetes Father   . Liver disease Sister   . Anemia Sister   . Heart disease Sister   . Heart attack Sister   . Heart disease Brother        CABG 20 YEARS AGO  . Hypertension Brother   . Mesothelioma Brother        HALF-BROTHER  . Cancer - Other Brother        CLL  . Cancer - Lung Brother        Mets to brain  . Cancer - Lung Sister        HALF-SISTER  . Liver disease Brother   . Heart disease Brother        CABG-2012  . Emphysema Maternal Grandfather   . Cancer - Other Paternal Grandmother        Stomach  . Heart attack Paternal Grandfather     ALLERGIES:  is allergic to statins.  MEDICATIONS:  Current Outpatient Medications  Medication Sig Dispense Refill  . acetaminophen (TYLENOL) 325 MG tablet Take 325 mg by mouth every 6 (six) hours as needed for fever (for fever/pain.).     [name] cholecalciferol (VITAMIN D) 1000 units tablet Take 1,000 Units by mouth daily.    [name] docusate sodium (COLACE) 100 MG capsule Take 100-200 mg by mouth 2 (two) times daily as needed (for constipation.).     [name] metoprolol tartrate (LOPRESSOR) 25 MG tablet Take 0.5 tablets (12.5 mg total) by mouth 2 (two) times daily. 60 tablet 6  . ondansetron (ZOFRAN) 4 MG tablet Take 1 tablet (4 mg total) by mouth every 8 (eight) hours as needed for nausea or vomiting. 10 tablet 0  . pantoprazole (PROTONIX) 40 MG tablet Take 1 tablet (40 mg total) by mouth 2 (two) times daily before a meal. 60 tablet 3  . potassium chloride SA (K-DUR,KLOR-CON) 20 MEQ tablet Take 1 tablet (20 mEq total) by mouth 2 (two) times daily. 60 tablet 6  . spironolactone (ALDACTONE) 25 MG tablet Take 12.5 mg by mouth daily.    [name] torsemide (DEMADEX) 20 MG tablet Take 2 tablets (40 mg total) by mouth daily. 60 tablet 6  . tranexamic acid (LYSTEDA) 650 MG TABS tablet Take 2 tablets (1,300 mg total) 2 (two) times daily by mouth. 120 tablet 1  . fluticasone (FLONASE) 50 MCG/ACT nasal spray Place 2  sprays into both nostrils daily. (Patient not taking: Reported on 02/03/2017) 16 g 6  . sucralfate (CARAFATE)  1 GM/10ML suspension Take 10 mLs (1 g total) 2 (two) times daily by mouth. Before meals. Not with other medications (Patient not taking: Reported on 02/03/2017) 420 mL 1   No current facility-administered medications for this visit.    Facility-Administered Medications Ordered in Other Visits  Medication Dose Route Frequency Provider Last Rate Last Dose  . heparin lock flush 100 unit/mL  500 Units Intracatheter Daily PRN Truitt Merle, MD      . sodium chloride flush (NS) 0.9 % injection 10 mL  10 mL Intracatheter PRN Truitt Merle, MD        REVIEW OF SYSTEMS:    10 Point review of Systems was done is negative except as noted above.  PHYSICAL EXAMINATION:  ECOG PERFORMANCE STATUS: 1 - Symptomatic but completely ambulatory  . Vitals:   02/10/17 1003  BP: 132/68  Pulse: 75  Resp: 18  Temp: 97.7 F (36.5 C)  SpO2: 98%   Filed Weights   02/10/17 1003  Weight: 242 lb 4.8 oz (109.9 kg)   .Body mass index is 36.84 kg/m.  GENERAL:alert, in no acute distress and comfortable SKIN: no acute rashes, no significant lesions EYES: conjunctiva are pink and non-injected, sclera anicteric OROPHARYNX: MMM, no exudates, no oropharyngeal erythema or ulceration NECK: supple, no JVD LYMPH:  no palpable lymphadenopathy in the cervical, axillary or inguinal regions LUNGS: clear to auscultation b/l with normal respiratory effort HEART: regular rate & rhythm ABDOMEN:  normoactive bowel sounds , non tender, not distended. Extremity: Trace pedal edema bilaterally.  PSYCH: alert & oriented x 3 with fluent speech NEURO: no focal motor/sensory deficits  LABORATORY DATA:  I have reviewed the data as listed  . CBC Latest Ref Rng & Units 02/10/2017 02/08/2017 01/25/2017  WBC 4.0 - 10.3 10e3/uL 7.1 5.1 5.3  Hemoglobin 13.0 - 17.1 g/dL 8.7(L) 6.9(LL) 8.3(L)  Hematocrit 38.4 - 49.9 % 27.2(L) 21.7(L)  27.1(L)  Platelets 140 - 400 10e3/uL 103(L) 116(L) 113(L)    . CMP Latest Ref Rng & Units 02/10/2017 02/08/2017 01/25/2017  Glucose 70 - 140 mg/dl 115 130 110  BUN 7.0 - 26.0 mg/dL 10.4 13.5 14.0  Creatinine 0.7 - 1.3 mg/dL 1.3 1.2 1.4(H)  Sodium 136 - 145 mEq/L 139 138 138  Potassium 3.5 - 5.1 mEq/L 3.9 4.1 4.3  Chloride 101 - 111 mmol/L - - -  CO2 22 - 29 mEq/L 24 22 25   Calcium 8.4 - 10.4 mg/dL 8.9 8.5 8.5  Total Protein 6.4 - 8.3 g/dL 6.0(L) 5.9(L) 6.1(L)  Total Bilirubin 0.20 - 1.20 mg/dL 4.97(HH) 4.33(HH) 4.76(HH)  Alkaline Phos 40 - 150 U/L 108 97 88  AST 5 - 34 U/L 30 26 35(H)  ALT 0 - 55 U/L 23 22 24     . Lab Results  Component Value Date   IRON 20 (L) 02/08/2017   TIBC 379 02/08/2017   IRONPCTSAT 5 (L) 02/08/2017   (Iron and TIBC)  Lab Results  Component Value Date   FERRITIN 26 02/08/2017    RADIOGRAPHIC STUDIES: I have personally reviewed the radiological images as listed and agreed with the findings in the report. Nm Gi Blood Loss  Result Date: 01/27/2017 CLINICAL DATA:  GI bleeding. Rectal bleeding for 8 months. Cirrhosis. EXAM: NUCLEAR MEDICINE GASTROINTESTINAL BLEEDING SCAN TECHNIQUE: Sequential abdominal images were obtained following intravenous administration of Tc-49mlabeled red blood cells. RADIOPHARMACEUTICALS:  26.8 mCi Tc-99mn-vitro labeled red cells. COMPARISON:  CT 11/01/2016 FINDINGS: Expected uptake within the vascular structures and spleen. Spleen is prominent  and compatible with history of cirrhosis. No evidence for GI bleeding after 2 hours of imaging. IMPRESSION: Negative GI bleeding scan. Electronically Signed   By: Markus Daft M.D.   On: 01/27/2017 17:09    ASSESSMENT & PLAN:   67 year old male with multiple medical comorbidities with Karlene Lineman with liver cirrhosis with portal hypertension, esophageal varices and portal hypertensive gastropathy with  #1 Acute on Chronic blood loss anemia.  Patient has had recurrent GI bleeding requiring > 20  units of PRBCs. Between April 2018 and Aug 2018 and continues to have ongoing GI bleeding. Previous workup showed LDH within normal limits at 220 and suggests against overt hemolysis. Sedimentation rate within normal limits. Coombs' test is negative. Myeloma panel and serum free light chains suggest no monoclonal paraproteinemia. PNH testing negative Slightly lower haptoglobin levels can be related to decreased hepatic production, low-level hemolysis due to transfusions or extravascular hemolysis due to splenic or hepatic sequestration.  small bowel endoscopy done on 12/02/2016: impression - The examined portion of the jejunum was normal.  - Normal examined duodenum. - Portal hypertensive gastropathy.  -Small (< 5 mm) esophageal varices. - No specimens collected.  Recent rbc tagged study unrevealing. PLAN -we discussed that he continues to have ongoing GI bleeding and notes intermittent melena. -he has had extensive GI evaluation with no overt source evidence of controllable GIB at this time. -PRBC transfusion 2 units couple of days ago -continue lysteda 1382m po 2x daily -schedule for rpt capsule endoscopy  --Again instructed pt to immediately report to ED for red blood cell tag scans if experiences black stools or other signs of overt bleeding. -Recommended pt consult with PCP about seeing hepatologist to track abnormal liver function and discuss possible use of beta blocker.-bilirubin 4.97 today -Also fu with PCP about overall treatment/management plan   Labs in 2 weeks RTC with Dr Cait Locust in 4 weeks with labs  All of the patients questions were answered with apparent satisfaction. The patient knows to call the clinic with any problems, questions or concerns.  I spent 20 minutes counseling the patient face to face. The total time spent in the appointment was 20 minutes and more than 50% was on counseling and direct patient cares and co-ordination and arranging his transfer to the ED     GSullivan LoneMD MLong BarnAAHIVMS SPortsmouth Regional Ambulatory Surgery Center LLCCPhysicians Behavioral HospitalHematology/Oncology Physician CMayodan (Office):       [phone](Work cell):  [phone](Fax):           [phone] This document serves as a record of services personally performed by GSullivan Lone MD. It was created on his behalf by LAlean Rinne a trained medical scribe. The creation of this record is based on the scribe's personal observations and the provider's statements to them.   .I have reviewed the above documentation for accuracy and completeness, and I agree with the above. .Brunetta GeneraMD

## 2017-02-10 NOTE — Patient Instructions (Signed)
Thank you for choosing [facility] to provide your oncology and hematology care.  To afford each patient quality time with our providers, please arrive 30 minutes before your scheduled appointment time.  If you arrive late for your appointment, you may be asked to reschedule.  We strive to give you quality time with our providers, and arriving late affects you and other patients whose appointments are after yours.  If you are a no show for multiple scheduled visits, you may be dismissed from the clinic at the providers discretion.   Again, thank you for choosing [facility], our hope is that these requests will decrease the amount of time that you wait before being seen by our physicians.  ______________________________________________________________________ Should you have questions after your visit to the [facility], please contact our office at [phone] between the hours of 8:30 and 4:30 p.m.    Voicemails left after 4:30p.m will not be returned until the following business day.   For prescription refill requests, please have your pharmacy contact us directly.  Please also try to allow 48 hours for prescription requests.   Please contact the scheduling department for questions regarding scheduling.  For scheduling of procedures such as PET scans, CT scans, MRI, Ultrasound, etc please contact central scheduling at [phone].   Resources For Cancer Patients and Caregivers:  American Cancer Society:  800-227-2345  Can help patients locate various types of support and financial assistance Cancer Care: 1-800-813-HOPE (4673) Provides financial assistance, online support groups, medication/co-pay assistance.   [place] DSS:  336-641-3447 Where to apply for food stamps, Medicaid, and utility assistance Medicare Rights Center: 800-333-4114 Helps people with Medicare understand their rights and benefits, navigate the Medicare system, and secure the  quality healthcare they deserve SCAT: 336-333-6589 [place] [employer]'s shared-ride transportation service for eligible riders who have a disability that prevents them from riding the fixed route bus.   For additional information on assistance programs please contact our social worker:   Grier Hock/Abigail Elmore:  [phone] 

## 2017-02-13 ENCOUNTER — Encounter (HOSPITAL_COMMUNITY)
Admission: RE | Disposition: A | Discharge status: Home / Self Care | Payer: Self-pay | Source: Ambulatory Visit | Attending: Gastroenterology

## 2017-02-13 ENCOUNTER — Ambulatory Visit (HOSPITAL_COMMUNITY)
Admission: RE | Admit: 2017-02-13 | Discharge: 2017-02-13 | Disposition: A | Discharge status: Home / Self Care | Payer: 59 | Source: Ambulatory Visit | Attending: Gastroenterology | Admitting: Gastroenterology

## 2017-02-13 DIAGNOSIS — Z8249 Family history of ischemic heart disease and other diseases of the circulatory system: Secondary | ICD-10-CM | POA: Insufficient documentation

## 2017-02-13 DIAGNOSIS — E118 Type 2 diabetes mellitus with unspecified complications: Secondary | ICD-10-CM | POA: Diagnosis not present

## 2017-02-13 DIAGNOSIS — I2510 Atherosclerotic heart disease of native coronary artery without angina pectoris: Secondary | ICD-10-CM | POA: Insufficient documentation

## 2017-02-13 DIAGNOSIS — K7460 Unspecified cirrhosis of liver: Secondary | ICD-10-CM | POA: Diagnosis not present

## 2017-02-13 DIAGNOSIS — I714 Abdominal aortic aneurysm, without rupture: Secondary | ICD-10-CM | POA: Diagnosis not present

## 2017-02-13 DIAGNOSIS — Z951 Presence of aortocoronary bypass graft: Secondary | ICD-10-CM | POA: Diagnosis not present

## 2017-02-13 DIAGNOSIS — E669 Obesity, unspecified: Secondary | ICD-10-CM | POA: Diagnosis not present

## 2017-02-13 DIAGNOSIS — Z808 Family history of malignant neoplasm of other organs or systems: Secondary | ICD-10-CM | POA: Insufficient documentation

## 2017-02-13 DIAGNOSIS — Z801 Family history of malignant neoplasm of trachea, bronchus and lung: Secondary | ICD-10-CM | POA: Insufficient documentation

## 2017-02-13 DIAGNOSIS — Z888 Allergy status to other drugs, medicaments and biological substances status: Secondary | ICD-10-CM | POA: Insufficient documentation

## 2017-02-13 DIAGNOSIS — Z800 Family history of malignant neoplasm of digestive organs: Secondary | ICD-10-CM | POA: Diagnosis not present

## 2017-02-13 DIAGNOSIS — I5032 Chronic diastolic (congestive) heart failure: Secondary | ICD-10-CM | POA: Insufficient documentation

## 2017-02-13 DIAGNOSIS — Z833 Family history of diabetes mellitus: Secondary | ICD-10-CM | POA: Insufficient documentation

## 2017-02-13 DIAGNOSIS — I110 Hypertensive heart disease with heart failure: Secondary | ICD-10-CM | POA: Diagnosis not present

## 2017-02-13 DIAGNOSIS — Z87891 Personal history of nicotine dependence: Secondary | ICD-10-CM | POA: Insufficient documentation

## 2017-02-13 DIAGNOSIS — K922 Gastrointestinal hemorrhage, unspecified: Secondary | ICD-10-CM | POA: Insufficient documentation

## 2017-02-13 DIAGNOSIS — E782 Mixed hyperlipidemia: Secondary | ICD-10-CM | POA: Insufficient documentation

## 2017-02-13 DIAGNOSIS — Z09 Encounter for follow-up examination after completed treatment for conditions other than malignant neoplasm: Secondary | ICD-10-CM | POA: Insufficient documentation

## 2017-02-13 HISTORY — PX: GIVENS CAPSULE STUDY: SHX5432

## 2017-02-13 SURGERY — IMAGING PROCEDURE, GI TRACT, INTRALUMINAL, VIA CAPSULE

## 2017-02-13 SURGICAL SUPPLY — 1 items: TOWEL COTTON PACK 4EA (MISCELLANEOUS) ×4 IMPLANT

## 2017-02-13 NOTE — Progress Notes (Signed)
Patient in for [place] capsule study.  Instructions given to patient on when to eat and when to return. Patient stated he understood all instruction. Patient swallowed capsule at [place]. Patient tolerated well.

## 2017-02-13 NOTE — H&P (Signed)
William [name] HPI: This is a 67 year old male with persistent obscure GI bleeding.  He requires blood transfusions routinely.  He has undergone multiple endoscopic procedures without evidence of any bleeding site.  He has a prior VCE without a clear source of bleeding, however, there was confusion about a jejunal lesion.  It was not clear if this was from his prior jejunal biopsy during his visit to [facility] versus a true bleeding site.  A VCE will be repeated today.  Past Medical History:  Diagnosis Date  . AAA (abdominal aortic aneurysm) ([place]) 03/2014   3.2 cm on MR abd.  F/u aortic u/s 06/2014 showed 3.0 x 3.1 cm AAA: recheck 2 yrs recommended (followed by cardiologist).  Minimal growth on 10/2016 CT abd done for epig pain.  Repeat u/s 10/2018.  [name] Anemia   . Bilateral renal cysts    simple (03/2014 MRI)  . CAD (coronary artery disease)   . Cholelithiases 2018   asymptomatic  . Chronic diastolic heart failure ([place])   . Cirrhosis ([place]) 05/2016   secondary to NASH; most recent u/s abd 07/2016 showed splenomegaly.  Hx of portal HTN changes with mild ascites and splenomegaly.  . Diabetes mellitus with complication ([place]) 38/4536   A1c 6.8%  . History of blood transfusion 2018 X 4 dates   "low blood" (09/21/2016)  . Hyperlipemia, mixed    elevated LFTs when on statins.    . Hypertension    Cr bump 04/01/16 so I changed benicar-hct to benicar plain and added amlodipine 5 mg.  . Iron deficiency anemia 05/2016   Acute blood loss anemia: hospitalized, required transfusion x 3 U: colonoscopy and capsule study unrevealing.  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  W/u for ? hemolytic anemia to be pursued by hematologist as outpt.  Appt with Dr. Cristi Loron at [facility] 09/13/16.  . Microscopic hematuria    Eval unremarkable by Dr. Eulogio Ditch.  [name] NASH (nonalcoholic steatohepatitis)    Fatty liver on MR abd 03/2014, + hx of  elevated transaminases and bili: followed by Dr. [name].  . Obesity   . Spleen enlarged     Past Surgical History:  Procedure Laterality Date  . CARDIAC CATHETERIZATION    . CARDIOVASCULAR STRESS TEST  01/17/2012; 05/04/16   Normal stress nuclear study x 2 (2018--Normal perfusion. LVEF 66% with normal wall motion. This is a low risk study).  . CERVICAL DISCECTOMY  1992  . COLONOSCOPY N/A 05/27/2016   No site/explanation for blood loss found.  Erythematous mucosa in cecum and ascending colon--Cecal bx: normal.  Procedure: COLONOSCOPY;  Surgeon: Carol Ada, MD;  Location: [facility] ENDOSCOPY;  Service: Endoscopy;  Laterality: N/A;  . COLONOSCOPY W/ POLYPECTOMY  09/10/2014   Polypectomy x 2: recall 5 yrs (Dr. [name]).  . CORONARY ANGIOPLASTY    . CORONARY ARTERY BYPASS GRAFT  03/2006  . ENTEROSCOPY N/A 07/31/2016   Procedure: ENTEROSCOPY;  Surgeon: Ladene Artist, MD;  Location: [facility] ENDOSCOPY;  Service: Endoscopy;  Laterality: N/A;  . ENTEROSCOPY N/A 12/02/2016   Procedure: ENTEROSCOPY;  Surgeon: Carol Ada, MD;  Location: [facility] ENDOSCOPY;  Service: Endoscopy;  Laterality: N/A;  . ESOPHAGOGASTRODUODENOSCOPY N/A 05/25/2016   No site/explanation for blood loss found.  Procedure: ESOPHAGOGASTRODUODENOSCOPY (EGD);  Surgeon: Juanita Craver, MD;  Location: [facility] ENDOSCOPY;  Service: Endoscopy;  Laterality: N/A;  . ESOPHAGOGASTRODUODENOSCOPY N/A 09/23/2016   Procedure: ESOPHAGOGASTRODUODENOSCOPY (EGD);  Surgeon: Carol Ada,  MD;  Location: [place] [facility] ENDOSCOPY;  Service: Endoscopy;  Laterality: N/A;  . GIVENS CAPSULE STUDY N/A 05/27/2016   No source identified.  Repeat 10/2016--results pending.  Procedure: GIVENS CAPSULE STUDY;  Surgeon: Carol Ada, MD;  Location: [facility] ENDOSCOPY;  Service: Endoscopy;  Laterality: N/A;  . GIVENS CAPSULE STUDY N/A 10/15/2016   Procedure: GIVENS CAPSULE STUDY;  Surgeon: Carol Ada, MD;  Location: [facility] ENDOSCOPY;  Service: Endoscopy;  Laterality: N/A;  . NECK SURGERY  1991  . NM GI BLOOD LOSS   01/27/2017   NEGATIVE  . SMALL BOWEL ENTEROSCOPY  10/2016   ([facility], Dr. [name]: Double balloon enteroscopy--to the level of proximal ileum) jejunal polyps- which were removed--not bleeding & benign.  No source of bleeding was identified.  . TRANSTHORACIC ECHOCARDIOGRAM  05/25/2016   EF 60%, normal wall motion, grd II DD, mild aortic stenosis, dilated aortic root and ascending aorta.  [name] VASECTOMY  1977    Family History  Problem Relation Age of Onset  . Hypertension Mother   . Cancer - Other Mother        liver cancer  . Heart disease Father   . Heart attack Father   . Cancer - Lung Father   . Diabetes Father   . Liver disease Sister   . Anemia Sister   . Heart disease Sister   . Heart attack Sister   . Heart disease Brother        CABG 20 YEARS AGO  . Hypertension Brother   . Mesothelioma Brother        HALF-BROTHER  . Cancer - Other Brother        CLL  . Cancer - Lung Brother        Mets to brain  . Cancer - Lung Sister        HALF-SISTER  . Liver disease Brother   . Heart disease Brother        CABG-2012  . Emphysema Maternal Grandfather   . Cancer - Other Paternal Grandmother        Stomach  . Heart attack Paternal Grandfather     Social History:  reports that he has quit smoking. His smoking use included cigarettes. he has never used smokeless tobacco. He reports that he does not drink alcohol or use drugs.  Allergies:  Allergies  Allergen Reactions  . Statins Other (See Comments)    Liver enzymes go up whenever on them    Medications: Scheduled: Continuous:  No results found for this or any previous visit (from the past 24 hour(s)).   No results found.  ROS:  As stated above in the HPI otherwise negative.  There were no vitals taken for this visit.    PE: Not performed.  Nursing   Assessment/Plan: 1) obscure GI bleeding - VCE.  William [name] D 02/13/2017, 2:06 PM

## 2017-02-14 ENCOUNTER — Encounter (HOSPITAL_COMMUNITY): Payer: Self-pay | Admitting: Gastroenterology

## 2017-02-14 DIAGNOSIS — R195 Other fecal abnormalities: Secondary | ICD-10-CM | POA: Diagnosis not present

## 2017-02-14 DIAGNOSIS — D509 Iron deficiency anemia, unspecified: Secondary | ICD-10-CM | POA: Diagnosis not present

## 2017-02-21 ENCOUNTER — Encounter (INDEPENDENT_AMBULATORY_CARE_PROVIDER_SITE_OTHER): Payer: Self-pay

## 2017-02-21 DIAGNOSIS — D509 Iron deficiency anemia, unspecified: Secondary | ICD-10-CM | POA: Diagnosis not present

## 2017-02-21 DIAGNOSIS — K766 Portal hypertension: Secondary | ICD-10-CM | POA: Diagnosis not present

## 2017-02-21 DIAGNOSIS — I8500 Esophageal varices without bleeding: Secondary | ICD-10-CM | POA: Diagnosis not present

## 2017-02-22 ENCOUNTER — Other Ambulatory Visit: Payer: Self-pay

## 2017-02-22 ENCOUNTER — Telehealth: Payer: Self-pay

## 2017-02-22 ENCOUNTER — Other Ambulatory Visit: Payer: Self-pay | Admitting: Hematology

## 2017-02-22 NOTE — Telephone Encounter (Signed)
Spoke with Arbie Cookey at [facility] in response to message from pt wife in regards to recommendations made by Dr. [name]. Dr. Irene Limbo requested OV note from Dr. [name] to review recommendations. Number for [facility] [phone]. Message sent via MyChart to let pt and wife know that note received and pending review.

## 2017-02-23 ENCOUNTER — Other Ambulatory Visit: Payer: Self-pay

## 2017-02-23 ENCOUNTER — Encounter: Payer: Self-pay | Admitting: Family Medicine

## 2017-02-23 MED ORDER — ONDANSETRON HCL 4 MG PO TABS: 4.0000 mg | ORAL_TABLET | Freq: Three times a day (TID) | ORAL | 0 refills | Status: DC | PRN

## 2017-02-23 MED FILL — ONDANSETRON HCL 4 MG TABLET: 4 | 3 days supply | Qty: 10 | Fill #0

## 2017-02-24 ENCOUNTER — Inpatient Hospital Stay: Payer: 59 | Attending: Hematology

## 2017-02-24 DIAGNOSIS — K922 Gastrointestinal hemorrhage, unspecified: Secondary | ICD-10-CM

## 2017-02-24 DIAGNOSIS — D500 Iron deficiency anemia secondary to blood loss (chronic): Secondary | ICD-10-CM | POA: Diagnosis not present

## 2017-02-24 LAB — CMP (CANCER CENTER ONLY)
ALK PHOS: 109 U/L (ref 40–150)
ALT: 22 U/L (ref 0–55)
AST: 28 U/L (ref 5–34)
Albumin: 2.8 g/dL — ABNORMAL LOW (ref 3.5–5.0)
Anion gap: 8 (ref 3–11)
BILIRUBIN TOTAL: 4.2 mg/dL — AB (ref 0.2–1.2)
BUN: 10 mg/dL (ref 7–26)
CALCIUM: 8.3 mg/dL — AB (ref 8.4–10.4)
CO2: 25 mmol/L (ref 22–29)
Chloride: 104 mmol/L (ref 98–109)
Creatinine: 1.35 mg/dL — ABNORMAL HIGH (ref 0.70–1.30)
GFR, EST NON AFRICAN AMERICAN: 53 mL/min — AB (ref >60)
GFR, Est AFR Am: >60 mL/min (ref >60)
Glucose, Bld: 164 mg/dL — ABNORMAL HIGH (ref 70–140)
POTASSIUM: 4 mmol/L (ref 3.5–5.1)
Sodium: 137 mmol/L (ref 136–145)
TOTAL PROTEIN: 6.1 g/dL — AB (ref 6.4–8.3)

## 2017-02-24 LAB — CBC WITH DIFFERENTIAL (CANCER CENTER ONLY)
Basophils Absolute: 0.1 10*3/uL (ref 0.0–0.1)
Basophils Relative: 1 %
Eosinophils Absolute: 0.6 10*3/uL — ABNORMAL HIGH (ref 0.0–0.5)
Eosinophils Relative: 11 %
HEMATOCRIT: 29.8 % — AB (ref 38.4–49.9)
HEMOGLOBIN: 9.2 g/dL — AB (ref 13.0–17.1)
LYMPHS ABS: 0.8 10*3/uL — AB (ref 0.9–3.3)
LYMPHS PCT: 16 %
MCH: 27.8 pg (ref 27.2–33.4)
MCHC: 30.9 g/dL — AB (ref 32.0–36.0)
MCV: 90 fL (ref 79.3–98.0)
MONO ABS: 0.7 10*3/uL (ref 0.1–0.9)
MONOS PCT: 14 %
NEUTROS ABS: 3 10*3/uL (ref 1.5–6.5)
Neutrophils Relative %: 58 %
Platelet Count: 145 10*3/uL (ref 140–400)
RBC: 3.31 MIL/uL — ABNORMAL LOW (ref 4.20–5.82)
RDW: 21.4 % — ABNORMAL HIGH (ref 11.0–15.6)
WBC Count: 5.2 10*3/uL (ref 4.0–10.3)

## 2017-02-27 ENCOUNTER — Other Ambulatory Visit: Payer: Self-pay | Admitting: Cardiovascular Disease

## 2017-02-27 NOTE — Telephone Encounter (Signed)
She needs you to call her concerning pt;s prior authorization for Repatha.

## 2017-03-02 NOTE — Telephone Encounter (Signed)
Returned call, VM only option - LM that patient will continue on lowest tolerated dose of statin - rosuvastatin 5 mg once weekly

## 2017-03-03 ENCOUNTER — Other Ambulatory Visit: Payer: Self-pay | Admitting: Family Medicine

## 2017-03-03 MED FILL — PANTOPRAZOLE SOD DR 40 MG T: 40 | 90 days supply | Qty: 180 | Fill #0

## 2017-03-03 MED FILL — METOPROLOL TARTRATE 25 MG T: 25 | 60 days supply | Qty: 60 | Fill #4

## 2017-03-03 MED FILL — TRANEXAMIC ACID 650 MG TAB: 650 | 30 days supply | Qty: 90 | Fill #1

## 2017-03-03 MED FILL — TORSEMIDE 20 MG TABLET: 20 | 90 days supply | Qty: 180 | Fill #0

## 2017-03-03 MED FILL — SPIRONOLACTONE 25 MG TABLET: 25 | 90 days supply | Qty: 90 | Fill #0

## 2017-03-08 ENCOUNTER — Inpatient Hospital Stay: Payer: 59

## 2017-03-08 ENCOUNTER — Other Ambulatory Visit: Payer: Self-pay | Admitting: Pharmacist Clinician (PhC)/ Clinical Pharmacy Specialist

## 2017-03-08 VITALS — BP 111/61 | HR 65 | Temp 98.0°F | Resp 17

## 2017-03-08 DIAGNOSIS — D500 Iron deficiency anemia secondary to blood loss (chronic): Secondary | ICD-10-CM

## 2017-03-08 MED ORDER — EVOLOCUMAB 140 MG/ML ~~LOC~~ SOAJ: 140.0000 mg | SUBCUTANEOUS | 12 refills | Status: DC

## 2017-03-08 MED ORDER — SODIUM CHLORIDE 0.9 % IV SOLN
750.0000 mg | Freq: Once | INTRAVENOUS | Status: AC
  Administered 2017-03-08: 750 mg via INTRAVENOUS
  Filled 2017-03-08: qty 15

## 2017-03-08 NOTE — Patient Instructions (Signed)
Ferric carboxymaltose injection What is this medicine? FERRIC CARBOXYMALTOSE (ferr-ik car-box-ee-mol-toes) is an iron complex. Iron is used to make healthy red blood cells, which carry oxygen and nutrients throughout the body. This medicine is used to treat anemia in people with chronic kidney disease or people who cannot take iron by mouth. This medicine may be used for other purposes; ask your health care provider or pharmacist if you have questions. COMMON BRAND NAME(S): Injectafer What should I tell my health care provider before I take this medicine? They need to know if you have any of these conditions: -anemia not caused by low iron levels -high levels of iron in the blood -liver disease -an unusual or allergic reaction to iron, other medicines, foods, dyes, or preservatives -pregnant or trying to get pregnant -breast-feeding How should I use this medicine? This medicine is for infusion into a vein. It is given by a health care professional in a hospital or clinic setting. Talk to your pediatrician regarding the use of this medicine in children. Special care may be needed. Overdosage: If you think you have taken too much of this medicine contact a poison control center or emergency room at once. NOTE: This medicine is only for you. Do not share this medicine with others. What if I miss a dose? It is important not to miss your dose. Call your doctor or health care professional if you are unable to keep an appointment. What may interact with this medicine? Do not take this medicine with any of the following medications: -deferoxamine -dimercaprol -other iron products This medicine may also interact with the following medications: -chloramphenicol -deferasirox This list may not describe all possible interactions. Give your health care provider a list of all the medicines, herbs, non-prescription drugs, or dietary supplements you use. Also tell them if you smoke, drink alcohol, or use  illegal drugs. Some items may interact with your medicine. What should I watch for while using this medicine? Visit your doctor or health care professional regularly. Tell your doctor if your symptoms do not start to get better or if they get worse. You may need blood work done while you are taking this medicine. You may need to follow a special diet. Talk to your doctor. Foods that contain iron include: whole grains/cereals, dried fruits, beans, or peas, leafy green vegetables, and organ meats (liver, kidney). What side effects may I notice from receiving this medicine? Side effects that you should report to your doctor or health care professional as soon as possible: -allergic reactions like skin rash, itching or hives, swelling of the face, lips, or tongue -breathing problems -changes in blood pressure -feeling faint or lightheaded, falls -flushing, sweating, or hot feelings Side effects that usually do not require medical attention (report to your doctor or health care professional if they continue or are bothersome): -changes in taste -constipation -dizziness -headache -nausea -pain, redness, or irritation at site where injected -vomiting This list may not describe all possible side effects. Call your doctor for medical advice about side effects. You may report side effects to FDA at 1-800-FDA-1088. Where should I keep my medicine? This drug is given in a hospital or clinic and will not be stored at home. NOTE: This sheet is a summary. It may not cover all possible information. If you have questions about this medicine, talk to your doctor, pharmacist, or health care provider.  2018 Elsevier/Gold Standard (2015-02-26 11:20:47)  

## 2017-03-08 NOTE — Progress Notes (Signed)
Pt stayed for entire 30 min post infusion observation.  Tolerated well.  VS stable.  A&Ox4 and ambulatory.

## 2017-03-09 MED FILL — REPATHA 140 MG/1 ML INJ: 140 | 28 days supply | Qty: 2 | Fill #0

## 2017-03-09 NOTE — Progress Notes (Signed)
HEMATOLOGY/ONCOLOGY CLINIC NOTE  Date of Service: 03/10/17  Patient Care Team: Tammi Sou, MD as PCP - General (Family Medicine) Juanita Craver, MD as Consulting Physician (Gastroenterology) Franchot Gallo, MD as Consulting Physician (Urology) Troy Sine, MD as Consulting Physician (Cardiology) Brunetta Genera, MD as Consulting Physician (Hematology) [name], Gaspar Skeeters, MD as Consulting Physician (Gastroenterology)  CHIEF COMPLAINTS/PURPOSE OF CONSULTATION:  Chronic blood loss Anemia from GI bleeding.  HISTORY OF PRESENTING ILLNESS:   William [name] is a wonderful 67 y.o. male who has been referred to [place] by Dr .Anitra Lauth, Adrian Blackwater, MD for evaluation and management of Anemia.  Patient has a history of extensive medical comorbidities including liver cirrhosis related to [facility] with portal hypertension, esophageal varices and portal hypertensive gastropathy and splenomegaly, iron deficiency anemia, obesity, diabetes, AAA.  Patient was admitted in April 2018 with acute blood loss anemia and required transfusion of multiple units of PRBCs with iron profile suggestive of iron deficiency. No overt evidence of hemolysis noted. He had an EGD and colonoscopy that did not show any overt bleeding. Capsule endoscopy was unrevealing but the capsule past and only 27 minutes.  He follows with Dr. [name] who is his gastric oncologist.  Patient was again readmitted in June 2018 with symptomatically anemia and hemoglobin of 6.3. He underwent 4 units of PRBC transfusions again and was sent out on PPI and iron supplementation with the plan to follow-up with Dr. [name].  He was also given hematology referral. He had a repeat ultrasound of the abdomen on 07/29/2016 which showed significant increase in splenomegaly in 2 months suggesting significant portal hypertension versus some element of splenic sequestration.  He continues to note intermittent black stools.  Hemoglobin is stable and  improved today and is up to 11.1. Patient notes he feels better after his transfusions. Has not noted lower GI bleeding at this time. We discuss any other workup to rule out less likely other possibilities of his anemia.  INTERVAL HISTORY  Patient is here for f/u of his anemia that is primarily related to ongoing issues with GI bleeding. The patient's last visit with [place] was on 02/10/17. He is accompanied today by his wife. The pt reports that he is doing well overall and enjoyed his holidays at home with their family that all live within 3 miles of them. He reports not having any problems with IV iron. Pt continues to take lysteda.   Of note since the patient last visit, pt has had a capsule endoscopy completed on 02/13/17 with results revealing no evidence of any bleeding during this examination. There was the possibility of an atypical AVM manifested as an erythematous patch. No evidence of any ulcerations, erosions, masses, or polyps.  Lab results today (03/10/17) are pending. Ferritin is at 228.   On review of systems, pt reports some black stools for the past 2 weeks, some fatigue, and denies any other symptoms.   MEDICAL HISTORY:  Past Medical History:  Diagnosis Date  . AAA (abdominal aortic aneurysm) ([place]) 03/2014   3.2 cm on MR abd.  F/u aortic u/s 06/2014 showed 3.0 x 3.1 cm AAA: recheck 2 yrs recommended (followed by cardiologist).  Minimal growth on 10/2016 CT abd done for epig pain.  Repeat u/s 10/2018.  [name] Anemia   . Bilateral renal cysts    simple (03/2014 MRI)  . CAD (coronary artery disease)   . Cholelithiases 2018   asymptomatic  . Chronic diastolic heart failure ([place])   .  Cirrhosis ([place]) 05/2016   secondary to NASH; most recent u/s abd 07/2016 showed splenomegaly.  Hx of portal HTN changes with mild ascites and splenomegaly.  . Diabetes mellitus with complication ([place]) 62/8315   A1c 6.8%  . History of blood transfusion 2018 X 4 dates   "low blood" (09/21/2016)  . Hyperlipemia,  mixed    elevated LFTs when on statins.    . Hypertension    Cr bump 04/01/16 so I changed benicar-hct to benicar plain and added amlodipine 5 mg.  . Iron deficiency anemia 05/2016   Acute blood loss anemia: hospitalized, required transfusion x 3 U: colonoscopy and capsule study unrevealing.  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  W/u for ? hemolytic anemia to be pursued by hematologist as outpt.  Dr. [name], GI at [facility] following, too---he rec'd onc do bone marrow bx as of Jan 2019  . Microscopic hematuria    Eval unremarkable by Dr. Eulogio Ditch.  [name] NASH (nonalcoholic steatohepatitis)    Fatty liver on MR abd 03/2014, + hx of elevated transaminases and bili: followed by Dr. [name].  . Obesity   . Spleen enlarged     SURGICAL HISTORY: Past Surgical History:  Procedure Laterality Date  . CARDIAC CATHETERIZATION    . CARDIOVASCULAR STRESS TEST  01/17/2012; 05/04/16   Normal stress nuclear study x 2 (2018--Normal perfusion. LVEF 66% with normal wall motion. This is a low risk study).  . CERVICAL DISCECTOMY  1992  . COLONOSCOPY N/A 05/27/2016   No site/explanation for blood loss found.  Erythematous mucosa in cecum and ascending colon--Cecal bx: normal.  Procedure: COLONOSCOPY;  Surgeon: Carol Ada, MD;  Location: [facility] ENDOSCOPY;  Service: Endoscopy;  Laterality: N/A;  . COLONOSCOPY W/ POLYPECTOMY  09/10/2014   Polypectomy x 2: recall 5 yrs (Dr. [name]).  . CORONARY ANGIOPLASTY    . CORONARY ARTERY BYPASS GRAFT  03/2006  . ENTEROSCOPY N/A 07/31/2016   Procedure: ENTEROSCOPY;  Surgeon: Ladene Artist, MD;  Location: [facility] ENDOSCOPY;  Service: Endoscopy;  Laterality: N/A;  . ENTEROSCOPY N/A 12/02/2016   Procedure: ENTEROSCOPY;  Surgeon: Carol Ada, MD;  Location: [facility] ENDOSCOPY;  Service: Endoscopy;  Laterality: N/A;  . ESOPHAGOGASTRODUODENOSCOPY N/A 05/25/2016   No site/explanation for blood loss found.   Procedure: ESOPHAGOGASTRODUODENOSCOPY (EGD);  Surgeon: Juanita Craver, MD;  Location[facility] ENDOSCOPY;  Service: Endoscopy;  Laterality: N/A;  . ESOPHAGOGASTRODUODENOSCOPY N/A 09/23/2016   Procedure: ESOPHAGOGASTRODUODENOSCOPY (EGD);  Surgeon: Carol Ada, MD;  Location: [place];  Service: Endoscopy;  Laterality: N/A;  . GIVENS CAPSULE STUDY N/A 05/27/2016   No source identified.  Repeat 10/2016--results pending.  Procedure: GIVENS CAPSULE STUDY;  Surgeon: Carol Ada, MD;  Location: [facility] ENDOSCOPY;  Service: Endoscopy;  Laterality: N/A;  . GIVENS CAPSULE STUDY N/A 10/15/2016   Procedure: GIVENS CAPSULE STUDY;  Surgeon: Carol Ada, MD;  Location: [facility] ENDOSCOPY;  Service: Endoscopy;  Laterality: N/A;  . GIVENS CAPSULE STUDY N/A 02/13/2017   Procedure: GIVENS CAPSULE STUDY;  Surgeon: Carol Ada, MD;  Location: [place];  Service: Endoscopy;  Laterality: N/A;  . NECK SURGERY  1991  . NM GI BLOOD LOSS  01/27/2017   NEGATIVE  . SMALL BOWEL ENTEROSCOPY  10/2016   ([facility], Dr. [name]: Double balloon enteroscopy--to the level of proximal ileum) jejunal polyps- which were removed--not bleeding & benign.  No source of bleeding was identified.  . TRANSTHORACIC ECHOCARDIOGRAM  05/25/2016   EF 60%, normal wall  motion, grd II DD, mild aortic stenosis, dilated aortic root and ascending aorta.  [name] VASECTOMY  1977    SOCIAL HISTORY: Social History   Socioeconomic History  . Marital status: Married    Spouse name: Not on file  . Number of children: Not on file  . Years of education: Not on file  . Highest education level: Not on file  Social Needs  . Financial resource strain: Not on file  . Food insecurity - worry: Not on file  . Food insecurity - inability: Not on file  . Transportation needs - medical: Not on file  . Transportation needs - non-medical: Not on file  Occupational History  . Not on file  Tobacco Use  . Smoking status: Former Smoker    Types: Cigarettes  . Smokeless tobacco: Never  Used  . Tobacco comment: QUIT I 1983  Substance and Sexual Activity  . Alcohol use: No  . Drug use: No  . Sexual activity: Yes  Other Topics Concern  . Not on file  Social History Narrative   Married, 3 grown children, 3 GCs.   Educ: 10th grade.   Occupation: Retired Brewing technologist.   Tob: quit 1983, smoked about 20 pack-yr hx prior.   No alcohol.    FAMILY HISTORY: Family History  Problem Relation Age of Onset  . Hypertension Mother   . Cancer - Other Mother        liver cancer  . Heart disease Father   . Heart attack Father   . Cancer - Lung Father   . Diabetes Father   . Liver disease Sister   . Anemia Sister   . Heart disease Sister   . Heart attack Sister   . Heart disease Brother        CABG 20 YEARS AGO  . Hypertension Brother   . Mesothelioma Brother        HALF-BROTHER  . Cancer - Other Brother        CLL  . Cancer - Lung Brother        Mets to brain  . Cancer - Lung Sister        HALF-SISTER  . Liver disease Brother   . Heart disease Brother        CABG-2012  . Emphysema Maternal Grandfather   . Cancer - Other Paternal Grandmother        Stomach  . Heart attack Paternal Grandfather     ALLERGIES:  is allergic to statins.  MEDICATIONS:  Current Outpatient Medications  Medication Sig Dispense Refill  . acetaminophen (TYLENOL) 325 MG tablet Take 325 mg by mouth every 6 (six) hours as needed for fever (for fever/pain.).     [name] cholecalciferol (VITAMIN D) 1000 units tablet Take 1,000 Units by mouth daily.    [name] docusate sodium (COLACE) 100 MG capsule Take 100-200 mg by mouth 2 (two) times daily as needed (for constipation.).     [name] Evolocumab (REPATHA SURECLICK) 245 MG/ML SOAJ Inject 140 mg into the skin every 14 (fourteen) days. 2 pen 12  . fluticasone (FLONASE) 50 MCG/ACT nasal spray Place 2 sprays into both nostrils daily. 16 g 6  . metoprolol tartrate (LOPRESSOR) 25 MG tablet Take 0.5 tablets (12.5 mg total) by mouth 2 (two) times daily. 60 tablet 6    . ondansetron (ZOFRAN) 4 MG tablet Take 1 tablet (4 mg total) by mouth every 8 (eight) hours as needed for nausea or vomiting. 10 tablet 0  . pantoprazole (  PROTONIX) 40 MG tablet TAKE 1 TABLET BY MOUTH TWICE DAILY BEFORE A MEAL 60 tablet 3  . potassium chloride SA (K-DUR,KLOR-CON) 20 MEQ tablet Take 1 tablet (20 mEq total) by mouth 2 (two) times daily. 60 tablet 6  . spironolactone (ALDACTONE) 25 MG tablet Take 12.5 mg by mouth daily.    [name] spironolactone (ALDACTONE) 25 MG tablet TAKE 1 TABLET (25 MG TOTAL) BY MOUTH DAILY. 30 tablet 3  . torsemide (DEMADEX) 20 MG tablet TAKE 2 TABLETS (40 MG TOTAL) BY MOUTH DAILY. 60 tablet 3  . tranexamic acid (LYSTEDA) 650 MG TABS tablet Take 2 tablets (1,300 mg total) 2 (two) times daily by mouth. 120 tablet 1  . sucralfate (CARAFATE) 1 GM/10ML suspension Take 10 mLs (1 g total) 2 (two) times daily by mouth. Before meals. Not with other medications (Patient not taking: Reported on 02/03/2017) 420 mL 1   No current facility-administered medications for this visit.    Facility-Administered Medications Ordered in Other Visits  Medication Dose Route Frequency Provider Last Rate Last Dose  . heparin lock flush 100 unit/mL  500 Units Intracatheter Daily PRN Truitt Merle, MD      . sodium chloride flush (NS) 0.9 % injection 10 mL  10 mL Intracatheter PRN Truitt Merle, MD        REVIEW OF SYSTEMS:    10 Point review of Systems was done is negative except as noted above.  PHYSICAL EXAMINATION:  ECOG PERFORMANCE STATUS: 1 - Symptomatic but completely ambulatory  . Vitals:   03/10/17 1118  BP: 129/61  Pulse: 88  Resp: 18  Temp: 98 F (36.7 C)  SpO2: 98%   Filed Weights   03/10/17 1118  Weight: 247 lb 1.6 oz (112.1 kg)   .Body mass index is 36.49 kg/m.  GENERAL:alert, in no acute distress and comfortable SKIN: no acute rashes, no significant lesions EYES: conjunctiva are pink and non-injected, sclera anicteric OROPHARYNX: MMM, no exudates, no  oropharyngeal erythema or ulceration NECK: supple, no JVD LYMPH:  no palpable lymphadenopathy in the cervical, axillary or inguinal regions LUNGS: clear to auscultation b/l with normal respiratory effort HEART: regular rate & rhythm ABDOMEN:  normoactive bowel sounds , non tender, not distended. Extremity: Trace pedal edema bilaterally.  PSYCH: alert & oriented x 3 with fluent speech NEURO: no focal motor/sensory deficits  LABORATORY DATA:  I have reviewed the data as listed  . CBC Latest Ref Rng & Units 03/10/2017 02/24/2017 02/10/2017  WBC 4.0 - 10.3 K/uL 8.2 5.2 7.1  Hemoglobin 13.0 - 17.1 g/dL - - 8.7(L)  Hematocrit 38.4 - 49.9 % 24.3(L) 29.8(L) 27.2(L)  Platelets 140 - 400 K/uL 105(L) 145 103(L)   . CMP Latest Ref Rng & Units 02/24/2017 02/10/2017 02/08/2017  Glucose 70 - 140 mg/dL 164(H) 115 130  BUN 7 - 26 mg/dL 10 10.4 13.5  Creatinine 0.7 - 1.3 mg/dL - 1.3 1.2  Sodium 136 - 145 mmol/L 137 139 138  Potassium 3.5 - 5.1 mmol/L 4.0 3.9 4.1  Chloride 98 - 109 mmol/L 104 - -  CO2 22 - 29 mmol/L 25 24 22   Calcium 8.4 - 10.4 mg/dL 8.3(L) 8.9 8.5  Total Protein 6.4 - 8.3 g/dL 6.1(L) 6.0(L) 5.9(L)  Total Bilirubin 0.2 - 1.2 mg/dL 4.2(HH) 4.97(HH) 4.33(HH)  Alkaline Phos 40 - 150 U/L 109 108 97  AST 5 - 34 U/L 28 30 26   ALT 0 - 55 U/L 22 23 22     . Lab Results  Component Value Date  IRON 330 (H) 03/10/2017   TIBC 336 03/10/2017   IRONPCTSAT 98 03/10/2017   (Iron and TIBC)  Lab Results  Component Value Date   FERRITIN 228 03/10/2017    RADIOGRAPHIC STUDIES: I have personally reviewed the radiological images as listed and agreed with the findings in the report. No results found.  ASSESSMENT & PLAN:   67 year old male with multiple medical comorbidities with Karlene Lineman with liver cirrhosis with portal hypertension, esophageal varices and portal hypertensive gastropathy with  #1 Acute on Chronic blood loss anemia.  Patient has had recurrent GI bleeding requiring > 20 units of  PRBCs. Between April 2018 and Aug 2018 and continues to have ongoing GI bleeding. Previous workup showed LDH within normal limits at 220 and suggests against overt hemolysis. Sedimentation rate within normal limits. Coombs' test is negative. Myeloma panel and serum free light chains suggest no monoclonal paraproteinemia. PNH testing negative Slightly lower haptoglobin levels can be related to decreased hepatic production, low-level hemolysis due to transfusions or extravascular hemolysis due to splenic or hepatic sequestration.  small bowel endoscopy done on 12/02/2016: impression - The examined portion of the jejunum was normal.  - Normal examined duodenum. - Portal hypertensive gastropathy.  -Small (< 5 mm) esophageal varices. - No specimens collected.  Capsule Endoscopy on 02/15/17: No evidence of any bleeding during this examination. There was the possibility of an atypical AVM manifested as an erythematous patch. No evidence of any ulcerations, erosions, masses, or polyps.  Recent rbc tagged study unrevealing. PLAN -we discussed that he continues to have ongoing GI bleeding and notes intermittent melena. -he has had extensive GI evaluation with no overt source evidence of controllable GIB at this time. -PRBC transfusion 2 units 02/08/17 -Increase lysteda 1361m po 3x daily from 2x daily given only GI bleeding. -Recommended pt consult with PCP about seeing hepatologist to track abnormal liver function and discuss possible use of beta blocker -Discussed LA sandostatin given as a shot every 4 weeks and the possible side effects and that the pt should look for worsened or new angina and constipation . (As per recommendation of Dr WMalka So- [facility]) -continue f/u with GI -continue monthly IV Iron -transfuse 2 units tommorrow   Additional labs today IV Injectafer q4weeks Please schedule for new Sandostatin q4week starting ASAP RTC with Dr KIrene Limboin 4 weeks Labs q2weeks  All of the patients  questions were answered with apparent satisfaction. The patient knows to call the clinic with any problems, questions or concerns.  I spent 20 minutes counseling the patient face to face. The total time spent in the appointment was 25 minutes and more than 50% was on counseling and direct patient cares and co-ordination and arranging his transfer to the ED    GSullivan LoneMD [email or web address] SThe Orthopaedic And Spine Center Of [facility] LLCCCharlie Norwood Va Medical CenterHematology/Oncology Physician [place]Wal[place] (Office):       [phone](Work cell):  [phone](Fax):           [phone] This document serves as a record of services personally performed by GSullivan Lone MD. It was created on his behalf by SBaldwin Jamaica a trained medical scribe. The creation of this record is based on the scribe's personal observations and the provider's statements to them.   .I have reviewed the above documentation for accuracy and completeness, and I agree with the above. .Brunetta GeneraMD MS

## 2017-03-10 ENCOUNTER — Telehealth: Payer: Self-pay

## 2017-03-10 ENCOUNTER — Other Ambulatory Visit: Payer: Self-pay

## 2017-03-10 ENCOUNTER — Telehealth: Payer: Self-pay | Admitting: Hematology

## 2017-03-10 ENCOUNTER — Inpatient Hospital Stay: Payer: 59 | Attending: Hematology | Admitting: Hematology

## 2017-03-10 ENCOUNTER — Encounter: Payer: Self-pay | Admitting: Hematology

## 2017-03-10 ENCOUNTER — Inpatient Hospital Stay: Payer: 59

## 2017-03-10 VITALS — BP 129/61 | HR 88 | Temp 98.0°F | Resp 18 | Ht 69.0 in | Wt 247.1 lb

## 2017-03-10 DIAGNOSIS — D500 Iron deficiency anemia secondary to blood loss (chronic): Secondary | ICD-10-CM

## 2017-03-10 DIAGNOSIS — K922 Gastrointestinal hemorrhage, unspecified: Secondary | ICD-10-CM

## 2017-03-10 DIAGNOSIS — D62 Acute posthemorrhagic anemia: Secondary | ICD-10-CM | POA: Diagnosis not present

## 2017-03-10 DIAGNOSIS — D649 Anemia, unspecified: Secondary | ICD-10-CM

## 2017-03-10 LAB — CBC WITH DIFFERENTIAL (CANCER CENTER ONLY)
BASOS PCT: 1 %
Basophils Absolute: 0.1 10*3/uL (ref 0.0–0.1)
EOS ABS: 0.6 10*3/uL — AB (ref 0.0–0.5)
EOS PCT: 7 %
HCT: 24.3 % — ABNORMAL LOW (ref 38.4–49.9)
Hemoglobin: 7.5 g/dL — ABNORMAL LOW (ref 13.0–17.1)
Lymphocytes Relative: 13 %
Lymphs Abs: 1 10*3/uL (ref 0.9–3.3)
MCH: 26.3 pg — ABNORMAL LOW (ref 27.2–33.4)
MCHC: 30.9 g/dL — ABNORMAL LOW (ref 32.0–36.0)
MCV: 85.3 fL (ref 79.3–98.0)
MONO ABS: 1.4 10*3/uL — AB (ref 0.1–0.9)
MONOS PCT: 17 %
NEUTROS PCT: 62 %
Neutro Abs: 5.1 10*3/uL (ref 1.5–6.5)
PLATELETS: 105 10*3/uL — AB (ref 140–400)
RBC: 2.85 MIL/uL — ABNORMAL LOW (ref 4.20–5.82)
RDW: 19.5 % — AB (ref 11.0–14.6)
Smear Review: 2
WBC Count: 8.2 10*3/uL (ref 4.0–10.3)

## 2017-03-10 LAB — IRON AND TIBC
IRON: 330 ug/dL — AB (ref 42–163)
SATURATION RATIOS: 98 % (ref 42–163)
TIBC: 336 ug/dL (ref 202–409)
UIBC: 6 ug/dL

## 2017-03-10 LAB — PREPARE RBC (CROSSMATCH)

## 2017-03-10 LAB — FERRITIN: FERRITIN: 228 ng/mL (ref 22–316)

## 2017-03-10 LAB — ABO/RH: ABO/RH(D): O POS

## 2017-03-10 NOTE — Telephone Encounter (Signed)
Scheduled appt per 2/1 los - Gave patient AVS and calender per los.

## 2017-03-10 NOTE — Telephone Encounter (Signed)
Pt Hgb today 7.5. Pt and wife given copy of CBC results. Per Dr. Irene Limbo 2U PRBCs to be scheduled Saturday 03/11/17. Scheduling message sent and orders placed. Pt to expect call for time to arrive. Told pt and wife to arrive at 0800 if they do not receive a call this afternoon.

## 2017-03-11 ENCOUNTER — Inpatient Hospital Stay: Payer: 59

## 2017-03-11 DIAGNOSIS — K922 Gastrointestinal hemorrhage, unspecified: Secondary | ICD-10-CM | POA: Diagnosis not present

## 2017-03-11 DIAGNOSIS — D500 Iron deficiency anemia secondary to blood loss (chronic): Secondary | ICD-10-CM | POA: Diagnosis not present

## 2017-03-11 DIAGNOSIS — D62 Acute posthemorrhagic anemia: Secondary | ICD-10-CM | POA: Diagnosis not present

## 2017-03-11 MED ORDER — TRANEXAMIC ACID 650 MG PO TABS: 1300.0000 mg | ORAL_TABLET | Freq: Three times a day (TID) | ORAL | 1 refills | Status: DC

## 2017-03-11 MED ORDER — ACETAMINOPHEN 325 MG PO TABS
ORAL_TABLET | ORAL | Status: AC
  Filled 2017-03-11: qty 2

## 2017-03-11 MED ORDER — DIPHENHYDRAMINE HCL 25 MG PO CAPS
ORAL_CAPSULE | ORAL | Status: AC
  Filled 2017-03-11: qty 1

## 2017-03-11 MED ORDER — DIPHENHYDRAMINE HCL 25 MG PO CAPS
25.0000 mg | ORAL_CAPSULE | Freq: Once | ORAL | Status: AC
  Administered 2017-03-11: 25 mg via ORAL

## 2017-03-11 MED ORDER — ACETAMINOPHEN 325 MG PO TABS
650.0000 mg | ORAL_TABLET | Freq: Once | ORAL | Status: AC
  Administered 2017-03-11: 650 mg via ORAL

## 2017-03-11 MED ORDER — SODIUM CHLORIDE 0.9 % IV SOLN
250.0000 mL | Freq: Once | INTRAVENOUS | Status: AC
  Administered 2017-03-11: 250 mL via INTRAVENOUS

## 2017-03-13 LAB — TYPE AND SCREEN
ABO/RH(D): O POS
Antibody Screen: NEGATIVE
Unit division: 0
Unit division: 0

## 2017-03-13 LAB — BPAM RBC
BLOOD PRODUCT EXPIRATION DATE: 201903012359
Blood Product Expiration Date: 201903012359
ISSUE DATE / TIME: 201902020857
ISSUE DATE / TIME: 201902020857
UNIT TYPE AND RH: 5100
Unit Type and Rh: 5100

## 2017-03-22 ENCOUNTER — Encounter: Payer: Self-pay | Admitting: Family Medicine

## 2017-03-22 ENCOUNTER — Ambulatory Visit (INDEPENDENT_AMBULATORY_CARE_PROVIDER_SITE_OTHER): Payer: 59 | Admitting: Family Medicine

## 2017-03-22 VITALS — BP 149/66 | HR 79 | Temp 101.4°F | Wt 245.0 lb

## 2017-03-22 DIAGNOSIS — R6889 Other general symptoms and signs: Secondary | ICD-10-CM | POA: Diagnosis not present

## 2017-03-22 DIAGNOSIS — J101 Influenza due to other identified influenza virus with other respiratory manifestations: Secondary | ICD-10-CM

## 2017-03-22 LAB — POC INFLUENZA A&B (BINAX/QUICKVUE)
Influenza A, POC: POSITIVE — AB
Influenza B, POC: NEGATIVE

## 2017-03-22 MED ORDER — OSELTAMIVIR PHOSPHATE 75 MG PO CAPS: 75.0000 mg | ORAL_CAPSULE | Freq: Two times a day (BID) | ORAL | 0 refills | Status: DC

## 2017-03-22 MED ORDER — HYDROCODONE-HOMATROPINE 5-1.5 MG/5ML PO SYRP: ORAL_SOLUTION | ORAL | 0 refills | Status: DC

## 2017-03-22 MED FILL — OSELTAMIVIR PHOSPHATE 75 MG: 75 | 5 days supply | Qty: 10 | Fill #0

## 2017-03-22 NOTE — Patient Instructions (Signed)
Get otc generic robitussin DM for cough and congestion. Use otc generic saline nasal spray 2-3 times per day to irrigate/moisturize your nasal passages. Do not go around other people until you have been without a fever for 48 hours.

## 2017-03-22 NOTE — Progress Notes (Signed)
OFFICE VISIT  03/22/2017   CC:  Chief Complaint  Patient presents with  . Fever    cough, nausea, vomiting x2 days   HPI:    Patient is a 67 y.o. Caucasian male who presents for fever and cough. Onset of sx's 36h ago: lots of coughing, feels wheezing in central chest region, some SOB feeling when he coughs a lot.  Sides are sore from coughing but o/w no signi aches.  +Fatigue. Post-tussive emesis small amount x 2.  No nausea.  A little bit of loose stool. Has been taking tylenol 1000 mg. No other otc meds used for this.   He did get flu vaccine 10/2016.  Past Medical History:  Diagnosis Date  . AAA (abdominal aortic aneurysm) ([place]) 03/2014   3.2 cm on MR abd.  F/u aortic u/s 06/2014 showed 3.0 x 3.1 cm AAA: recheck 2 yrs recommended (followed by cardiologist).  Minimal growth on 10/2016 CT abd done for epig pain.  Repeat u/s 10/2018.  [name] Anemia   . Bilateral renal cysts    simple (03/2014 MRI)  . CAD (coronary artery disease)   . Cholelithiases 2018   asymptomatic  . Chronic diastolic heart failure ([place])   . Cirrhosis ([place]) 05/2016   secondary to NASH; most recent u/s abd 07/2016 showed splenomegaly.  Hx of portal HTN changes with mild ascites and splenomegaly.  . Diabetes mellitus with complication ([place]) 76/2831   A1c 6.8%  . History of blood transfusion 2018 X 4 dates   "low blood" (09/21/2016)  . Hyperlipemia, mixed    elevated LFTs when on statins.    . Hypertension    Cr bump 04/01/16 so I changed benicar-hct to benicar plain and added amlodipine 5 mg.  . Iron deficiency anemia 05/2016   Acute blood loss anemia: hospitalized, required transfusion x 3 U: colonoscopy and capsule study unrevealing.  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  W/u for ? hemolytic anemia to be pursued by hematologist as outpt.  Dr. [name], GI at [facility] following, too---he rec'd onc do bone marrow bx as of Jan 2019   . Microscopic hematuria    Eval unremarkable by Dr. Eulogio Ditch.  [name] NASH (nonalcoholic steatohepatitis)    Fatty liver on MR abd 03/2014, + hx of elevated transaminases and bili: followed by Dr. [name].  . Obesity   . Spleen enlarged     Past Surgical History:  Procedure Laterality Date  . CARDIAC CATHETERIZATION    . CARDIOVASCULAR STRESS TEST  01/17/2012; 05/04/16   Normal stress nuclear study x 2 (2018--Normal perfusion. LVEF 66% with normal wall motion. This is a low risk study).  . CERVICAL DISCECTOMY  1992  . COLONOSCOPY N/A 05/27/2016   No site/explanation for blood loss found.  Erythematous mucosa in cecum and ascending colon--Cecal bx: normal.  Procedure: COLONOSCOPY;  Surgeon: Carol Ada, MD;  Location: [facility] ENDOSCOPY;  Service: Endoscopy;  Laterality: N/A;  . COLONOSCOPY W/ POLYPECTOMY  09/10/2014   Polypectomy x 2: recall 5 yrs (Dr. [name]).  . CORONARY ANGIOPLASTY    . CORONARY ARTERY BYPASS GRAFT  03/2006  . ENTEROSCOPY N/A 07/31/2016   Procedure: ENTEROSCOPY;  Surgeon: Ladene Artist, MD;  Location: [facility] ENDOSCOPY;  Service: Endoscopy;  Laterality: N/A;  . ENTEROSCOPY N/A 12/02/2016   Procedure: ENTEROSCOPY;  Surgeon: Carol Ada, MD;  Location: [facility] ENDOSCOPY;  Service: Endoscopy;  Laterality: N/A;  . ESOPHAGOGASTRODUODENOSCOPY N/A 05/25/2016  No site/explanation for blood loss found.  Procedure: ESOPHAGOGASTRODUODENOSCOPY (EGD);  Surgeon: Juanita Craver, MD;  Location: [facility] ENDOSCOPY;  Service: Endoscopy;  Laterality: N/A;  . ESOPHAGOGASTRODUODENOSCOPY N/A 09/23/2016   Procedure: ESOPHAGOGASTRODUODENOSCOPY (EGD);  Surgeon: Carol Ada, MD;  Location: [place];  Service: Endoscopy;  Laterality: N/A;  . GIVENS CAPSULE STUDY N/A 05/27/2016   No source identified.  Repeat 10/2016--results pending.  Procedure: GIVENS CAPSULE STUDY;  Surgeon: Carol Ada, MD;  Location: [facility] ENDOSCOPY;  Service: Endoscopy;  Laterality: N/A;  . GIVENS CAPSULE STUDY N/A 10/15/2016   Procedure: GIVENS  CAPSULE STUDY;  Surgeon: Carol Ada, MD;  Location: [facility] ENDOSCOPY;  Service: Endoscopy;  Laterality: N/A;  . GIVENS CAPSULE STUDY N/A 02/13/2017   Procedure: GIVENS CAPSULE STUDY;  Surgeon: Carol Ada, MD;  Location: [place];  Service: Endoscopy;  Laterality: N/A;  . NECK SURGERY  1991  . NM GI BLOOD LOSS  01/27/2017   NEGATIVE  . SMALL BOWEL ENTEROSCOPY  10/2016   ([facility], Dr. [name]: Double balloon enteroscopy--to the level of proximal ileum) jejunal polyps- which were removed--not bleeding & benign.  No source of bleeding was identified.  . TRANSTHORACIC ECHOCARDIOGRAM  05/25/2016   EF 60%, normal wall motion, grd II DD, mild aortic stenosis, dilated aortic root and ascending aorta.  [name] VASECTOMY  1977    Outpatient Medications Prior to Visit  Medication Sig Dispense Refill  . acetaminophen (TYLENOL) 325 MG tablet Take 325 mg by mouth every 6 (six) hours as needed for fever (for fever/pain.).     [name] cholecalciferol (VITAMIN D) 1000 units tablet Take 1,000 Units by mouth daily.    [name] docusate sodium (COLACE) 100 MG capsule Take 100-200 mg by mouth 2 (two) times daily as needed (for constipation.).     [name] Evolocumab (REPATHA SURECLICK) 163 MG/ML SOAJ Inject 140 mg into the skin every 14 (fourteen) days. 2 pen 12  . fluticasone (FLONASE) 50 MCG/ACT nasal spray Place 2 sprays into both nostrils daily. 16 g 6  . metoprolol tartrate (LOPRESSOR) 25 MG tablet Take 0.5 tablets (12.5 mg total) by mouth 2 (two) times daily. 60 tablet 6  . ondansetron (ZOFRAN) 4 MG tablet Take 1 tablet (4 mg total) by mouth every 8 (eight) hours as needed for nausea or vomiting. 10 tablet 0  . pantoprazole (PROTONIX) 40 MG tablet TAKE 1 TABLET BY MOUTH TWICE DAILY BEFORE A MEAL 60 tablet 3  . potassium chloride SA (K-DUR,KLOR-CON) 20 MEQ tablet Take 1 tablet (20 mEq total) by mouth 2 (two) times daily. 60 tablet 6  . spironolactone (ALDACTONE) 25 MG tablet Take 12.5 mg by mouth daily.    [name] torsemide (DEMADEX) 20 MG  tablet TAKE 2 TABLETS (40 MG TOTAL) BY MOUTH DAILY. 60 tablet 3  . tranexamic acid (LYSTEDA) 650 MG TABS tablet Take 2 tablets (1,300 mg total) by mouth 3 (three) times daily. 180 tablet 1  . spironolactone (ALDACTONE) 25 MG tablet TAKE 1 TABLET (25 MG TOTAL) BY MOUTH DAILY. 30 tablet 3  . sucralfate (CARAFATE) 1 GM/10ML suspension Take 10 mLs (1 g total) 2 (two) times daily by mouth. Before meals. Not with other medications 420 mL 1   Facility-Administered Medications Prior to Visit  Medication Dose Route Frequency Provider Last Rate Last Dose  . heparin lock flush 100 unit/mL  500 Units Intracatheter Daily PRN Truitt Merle, MD      . sodium chloride flush (NS) 0.9 % injection 10 mL  10 mL Intracatheter PRN Truitt Merle, MD  Allergies  Allergen Reactions  . Statins Other (See Comments)    Liver enzymes go up whenever on them    ROS As per HPI  PE: Blood pressure (!) 149/66, pulse 79, temperature (!) 101.4 F (38.6 C), temperature source Temporal, weight 245 lb (111.1 kg), SpO2 95 %. VS: noted--febrile. Gen: alert, NAD, NONTOXIC APPEARING. HEENT: eyes without injection, drainage, or swelling.  Ears: EACs clear, TMs with normal light reflex and landmarks.  Nose: Clear rhinorrhea, with some dried, crusty exudate adherent to mildly injected mucosa.  No purulent d/c.  No paranasal sinus TTP.  No facial swelling.  Throat and mouth without focal lesion.  No pharyngial swelling, erythema, or exudate.   Neck: supple, no LAD.   LUNGS: CTA bilat, nonlabored resps.   CV: RRR, 2-3/6 syst murmur, S1 obscured but S2 distinct.  No r/g. EXT: no c/c/e SKIN: no rash  LABS:  Lab Results  Component Value Date   WBC 8.2 03/10/2017   HGB 8.7 (L) 02/10/2017   HCT 24.3 (L) 03/10/2017   MCV 85.3 03/10/2017   PLT 105 (L) 03/10/2017     Chemistry      Component Value Date/Time   NA 137 02/24/2017 1053   NA 139 02/10/2017 0948   K 4.0 02/24/2017 1053   K 3.9 02/10/2017 0948   CL 104 02/24/2017  1053   CO2 25 02/24/2017 1053   CO2 24 02/10/2017 0948   BUN 10 02/24/2017 1053   BUN 10.4 02/10/2017 0948   CREATININE 1.35 (H) 02/24/2017 1053   CREATININE 1.3 02/10/2017 0948      Component Value Date/Time   CALCIUM 8.3 (L) 02/24/2017 1053   CALCIUM 8.9 02/10/2017 0948   ALKPHOS 109 02/24/2017 1053   ALKPHOS 108 02/10/2017 0948   AST 28 02/24/2017 1053   AST 30 02/10/2017 0948   ALT 22 02/24/2017 1053   ALT 23 02/10/2017 0948   BILITOT 4.2 (HH) 02/24/2017 1053   BILITOT 4.97 ([place]) 02/10/2017 0948     Rapid flu in office today: +influenza A  IMPRESSION AND PLAN:  Influenza A, onset of sx's <48h ago. Tamiflu 74m bid x 5d. Hycodan syrup: 1-2 tsp bid prn, #240 ml. Therapeutic expectations and side effect profile of medication discussed today.  Patient's questions answered. Get otc generic robitussin DM for cough and congestion. Use otc generic saline nasal spray 2-3 times per day to irrigate/moisturize your nasal passages. Do not go around other people until you have been without a fever for 48 hours.  An After Visit Summary was printed and given to the patient.   FOLLOW UP: Return if symptoms worsen or fail to improve.  Signed:  PCrissie Sickles MD           03/22/2017

## 2017-03-24 ENCOUNTER — Other Ambulatory Visit: Payer: 59

## 2017-03-27 ENCOUNTER — Other Ambulatory Visit: Payer: Self-pay | Admitting: Hematology

## 2017-03-27 ENCOUNTER — Inpatient Hospital Stay: Payer: 59

## 2017-03-27 DIAGNOSIS — K922 Gastrointestinal hemorrhage, unspecified: Secondary | ICD-10-CM

## 2017-03-27 DIAGNOSIS — D500 Iron deficiency anemia secondary to blood loss (chronic): Secondary | ICD-10-CM | POA: Diagnosis not present

## 2017-03-27 DIAGNOSIS — D62 Acute posthemorrhagic anemia: Secondary | ICD-10-CM | POA: Diagnosis not present

## 2017-03-27 LAB — FERRITIN: Ferritin: 147 ng/mL (ref 22–316)

## 2017-03-27 LAB — SAMPLE TO BLOOD BANK

## 2017-03-27 LAB — CMP (CANCER CENTER ONLY)
ALK PHOS: 93 U/L (ref 40–150)
ALT: 17 U/L (ref 0–55)
AST: 34 U/L (ref 5–34)
Albumin: 2.8 g/dL — ABNORMAL LOW (ref 3.5–5.0)
Anion gap: 11 (ref 3–11)
BUN: 9 mg/dL (ref 7–26)
CALCIUM: 8.6 mg/dL (ref 8.4–10.4)
CO2: 26 mmol/L (ref 22–29)
CREATININE: 1.24 mg/dL (ref 0.70–1.30)
Chloride: 100 mmol/L (ref 98–109)
GFR, Estimated: 58 mL/min — ABNORMAL LOW (ref >60)
GLUCOSE: 124 mg/dL (ref 70–140)
Potassium: 3.4 mmol/L — ABNORMAL LOW (ref 3.5–5.1)
Sodium: 137 mmol/L (ref 136–145)
Total Bilirubin: 4.8 mg/dL (ref 0.2–1.2)
Total Protein: 6.3 g/dL — ABNORMAL LOW (ref 6.4–8.3)

## 2017-03-27 LAB — CBC WITH DIFFERENTIAL/PLATELET
Basophils Absolute: 0 10*3/uL (ref 0.0–0.1)
Basophils Relative: 0 %
EOS ABS: 0.3 10*3/uL (ref 0.0–0.5)
Eosinophils Relative: 4 %
HEMATOCRIT: 33.3 % — AB (ref 38.4–49.9)
HEMOGLOBIN: 10.5 g/dL — AB (ref 13.0–17.1)
LYMPHS ABS: 0.8 10*3/uL — AB (ref 0.9–3.3)
Lymphocytes Relative: 11 %
MCH: 27.9 pg (ref 27.2–33.4)
MCHC: 31.5 g/dL — ABNORMAL LOW (ref 32.0–36.0)
MCV: 88.3 fL (ref 79.3–98.0)
MONOS PCT: 12 %
Monocytes Absolute: 0.9 10*3/uL (ref 0.1–0.9)
NEUTROS ABS: 5.6 10*3/uL (ref 1.5–6.5)
NEUTROS PCT: 73 %
Platelets: 166 10*3/uL (ref 140–400)
RBC: 3.77 MIL/uL — AB (ref 4.20–5.82)
RDW: 20.4 % — ABNORMAL HIGH (ref 11.0–14.6)
WBC: 7.7 10*3/uL (ref 4.0–10.3)

## 2017-03-27 LAB — RETICULOCYTES
RBC.: 3.77 MIL/uL — AB (ref 4.20–5.82)
RETIC CT PCT: 2.4 % — AB (ref 0.8–1.8)
Retic Count, Absolute: 90.5 10*3/uL (ref 34.8–93.9)

## 2017-04-03 MED FILL — TRANEXAMIC ACID 650 MG TAB: 650 | 30 days supply | Qty: 180 | Fill #0

## 2017-04-03 MED FILL — REPATHA 140 MG/1 ML INJ: 140 | 28 days supply | Qty: 2 | Fill #1

## 2017-04-05 ENCOUNTER — Encounter: Payer: Self-pay | Admitting: Family Medicine

## 2017-04-05 ENCOUNTER — Ambulatory Visit (INDEPENDENT_AMBULATORY_CARE_PROVIDER_SITE_OTHER): Payer: 59 | Admitting: Family Medicine

## 2017-04-05 VITALS — BP 135/71 | HR 68 | Temp 97.9°F | Resp 16 | Ht 69.0 in | Wt 223.5 lb

## 2017-04-05 DIAGNOSIS — E7800 Pure hypercholesterolemia, unspecified: Secondary | ICD-10-CM

## 2017-04-05 DIAGNOSIS — D500 Iron deficiency anemia secondary to blood loss (chronic): Secondary | ICD-10-CM

## 2017-04-05 DIAGNOSIS — Z23 Encounter for immunization: Secondary | ICD-10-CM | POA: Diagnosis not present

## 2017-04-05 DIAGNOSIS — I10 Essential (primary) hypertension: Secondary | ICD-10-CM

## 2017-04-05 DIAGNOSIS — E119 Type 2 diabetes mellitus without complications: Secondary | ICD-10-CM | POA: Diagnosis not present

## 2017-04-05 LAB — POCT GLYCOSYLATED HEMOGLOBIN (HGB A1C): Hemoglobin A1C: 4.6

## 2017-04-05 MED ORDER — ONDANSETRON HCL 4 MG PO TABS: 4.0000 mg | ORAL_TABLET | Freq: Three times a day (TID) | ORAL | 1 refills | Status: DC | PRN

## 2017-04-05 MED FILL — ONDANSETRON HCL 4 MG TABLET: 4 | 4 days supply | Qty: 10 | Fill #0

## 2017-04-05 NOTE — Progress Notes (Signed)
OFFICE VISIT  04/05/2017   CC:  Chief Complaint  Patient presents with  . Follow-up    RCI, pt is fasting.     HPI:    Patient is a 67 y.o. Caucasian male who presents for f/u DM 2, HTN, HLD. He has chronic GI blood loss anemia.  Unfortunately, extensive work-up with bleeding scans and repeated endoscopies have not revealed a source to explain the bleeding. Most recent blood transfusion was 03/14/17. Most recent bleeding scan by his GI MD was 01/27/17--NEGATIVE. He says he still has some intermittent stools that are "a little dark".  No tar-like consistency.  Stool is solid. He has f/u with Dr. Irene Limbo in hematology in 2d---will get repeat CBC and determine if iron infusion and/or RBC transfusion needed.  He has recovered from recent flu illness, just waiting for his voice to completely return. He got back on his fluid pills and has no LE swelling.  DM: fasting glucose checks avg 110.  No monitoring later in the day. Diet is fairly good diabetic diet.  Not on any diabetes meds.  HTN: home monitoring showing bp's consistently <120/80.  Feet: no burning, tingling, numbness in feet.  HLD: not on statin due to recent liver lab abnormalities and cirrhosis noted on 05/2016 ultrasound.  ROS: no CP, no palpitations, no dizziness, no jaundice, no BRBPR or hematuria.  Past Medical History:  Diagnosis Date  . AAA (abdominal aortic aneurysm) ([place]) 03/2014   3.2 cm on MR abd.  F/u aortic u/s 06/2014 showed 3.0 x 3.1 cm AAA: recheck 2 yrs recommended (followed by cardiologist).  Minimal growth on 10/2016 CT abd done for epig pain.  Repeat u/s 10/2018.  [name] Anemia   . Bilateral renal cysts    simple (03/2014 MRI)  . CAD (coronary artery disease)   . Cholelithiases 2018   asymptomatic  . Chronic diastolic heart failure ([place])   . Cirrhosis ([place]) 05/2016   secondary to NASH; most recent u/s abd 07/2016 showed splenomegaly.  Hx of portal HTN changes with mild ascites and splenomegaly.  . Diabetes  mellitus with complication ([place]) 83/1517   A1c 6.8%  . History of blood transfusion 2018 X 4 dates   "low blood" (09/21/2016)  . Hyperlipemia, mixed    elevated LFTs when on statins.    . Hypertension    Cr bump 04/01/16 so I changed benicar-hct to benicar plain and added amlodipine 5 mg.  . Iron deficiency anemia 05/2016   Acute blood loss anemia: hospitalized, required transfusion x 3 U: colonoscopy and capsule study unrevealing.  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  W/u for ? hemolytic anemia to be pursued by hematologist as outpt.  Dr. [name], GI at [facility] following, too---he rec'd onc do bone marrow bx as of Jan 2019  . Microscopic hematuria    Eval unremarkable by Dr. Eulogio Ditch.  [name] NASH (nonalcoholic steatohepatitis)    Fatty liver on MR abd 03/2014, + hx of elevated transaminases and bili: followed by Dr. [name].  . Obesity   . Spleen enlarged     Past Surgical History:  Procedure Laterality Date  . CARDIAC CATHETERIZATION    . CARDIOVASCULAR STRESS TEST  01/17/2012; 05/04/16   Normal stress nuclear study x 2 (2018--Normal perfusion. LVEF 66% with normal wall motion. This is a low risk study).  . CERVICAL DISCECTOMY  1992  . COLONOSCOPY N/A 05/27/2016   No site/explanation for  blood loss found.  Erythematous mucosa in cecum and ascending colon--Cecal bx: normal.  Procedure: COLONOSCOPY;  Surgeon: Carol Ada, MD;  Location: [facility] ENDOSCOPY;  Service: Endoscopy;  Laterality: N/A;  . COLONOSCOPY W/ POLYPECTOMY  09/10/2014   Polypectomy x 2: recall 5 yrs (Dr. [name]).  . CORONARY ANGIOPLASTY    . CORONARY ARTERY BYPASS GRAFT  03/2006  . ENTEROSCOPY N/A 07/31/2016   Procedure: ENTEROSCOPY;  Surgeon: Ladene Artist, MD;  Location: [facility] ENDOSCOPY;  Service: Endoscopy;  Laterality: N/A;  . ENTEROSCOPY N/A 12/02/2016   Procedure: ENTEROSCOPY;  Surgeon: Carol Ada, MD;  Location: [facility] ENDOSCOPY;  Service:  Endoscopy;  Laterality: N/A;  . ESOPHAGOGASTRODUODENOSCOPY N/A 05/25/2016   No site/explanation for blood loss found.  Procedure: ESOPHAGOGASTRODUODENOSCOPY (EGD);  Surgeon: Juanita Craver, MD;  Location: [facility] ENDOSCOPY;  Service: Endoscopy;  Laterality: N/A;  . ESOPHAGOGASTRODUODENOSCOPY N/A 09/23/2016   Procedure: ESOPHAGOGASTRODUODENOSCOPY (EGD);  Surgeon: Carol Ada, MD;  Location: [place];  Service: Endoscopy;  Laterality: N/A;  . GIVENS CAPSULE STUDY N/A 05/27/2016   No source identified.  Repeat 10/2016--results pending.  Procedure: GIVENS CAPSULE STUDY;  Surgeon: Carol Ada, MD;  Location: [facility] ENDOSCOPY;  Service: Endoscopy;  Laterality: N/A;  . GIVENS CAPSULE STUDY N/A 10/15/2016   Procedure: GIVENS CAPSULE STUDY;  Surgeon: Carol Ada, MD;  Location: [facility] ENDOSCOPY;  Service: Endoscopy;  Laterality: N/A;  . GIVENS CAPSULE STUDY N/A 02/13/2017   Procedure: GIVENS CAPSULE STUDY;  Surgeon: Carol Ada, MD;  Location: [place];  Service: Endoscopy;  Laterality: N/A;  . NECK SURGERY  1991  . NM GI BLOOD LOSS  01/27/2017   NEGATIVE  . SMALL BOWEL ENTEROSCOPY  10/2016   ([facility], Dr. [name]: Double balloon enteroscopy--to the level of proximal ileum) jejunal polyps- which were removed--not bleeding & benign.  No source of bleeding was identified.  . TRANSTHORACIC ECHOCARDIOGRAM  05/25/2016   EF 60%, normal wall motion, grd II DD, mild aortic stenosis, dilated aortic root and ascending aorta.  [name] VASECTOMY  1977    Outpatient Medications Prior to Visit  Medication Sig Dispense Refill  . acetaminophen (TYLENOL) 325 MG tablet Take 325 mg by mouth every 6 (six) hours as needed for fever (for fever/pain.).     [name] cholecalciferol (VITAMIN D) 1000 units tablet Take 1,000 Units by mouth daily.    [name] docusate sodium (COLACE) 100 MG capsule Take 100-200 mg by mouth 2 (two) times daily as needed (for constipation.).     [name] Evolocumab (REPATHA SURECLICK) 945 MG/ML SOAJ Inject 140 mg into the skin every 14  (fourteen) days. 2 pen 12  . fluticasone (FLONASE) 50 MCG/ACT nasal spray Place 2 sprays into both nostrils daily. 16 g 6  . metoprolol tartrate (LOPRESSOR) 25 MG tablet Take 0.5 tablets (12.5 mg total) by mouth 2 (two) times daily. 60 tablet 6  . pantoprazole (PROTONIX) 40 MG tablet TAKE 1 TABLET BY MOUTH TWICE DAILY BEFORE A MEAL 60 tablet 3  . potassium chloride SA (K-DUR,KLOR-CON) 20 MEQ tablet Take 1 tablet (20 mEq total) by mouth 2 (two) times daily. 60 tablet 6  . spironolactone (ALDACTONE) 25 MG tablet Take 12.5 mg by mouth daily.    [name] torsemide (DEMADEX) 20 MG tablet TAKE 2 TABLETS (40 MG TOTAL) BY MOUTH DAILY. 60 tablet 3  . tranexamic acid (LYSTEDA) 650 MG TABS tablet Take 2 tablets (1,300 mg total) by mouth 3 (three) times daily. 180 tablet 1  . ondansetron (ZOFRAN) 4 MG tablet Take 1 tablet (4 mg total) by  mouth every 8 (eight) hours as needed for nausea or vomiting. 10 tablet 0  . HYDROcodone-homatropine (HYCODAN) 5-1.5 MG/5ML syrup 1-2 tsp po bid prn cough (Patient not taking: Reported on 04/05/2017) 240 mL 0  . oseltamivir (TAMIFLU) 75 MG capsule Take 1 capsule (75 mg total) by mouth 2 (two) times daily. (Patient not taking: Reported on 04/05/2017) 10 capsule 0   Facility-Administered Medications Prior to Visit  Medication Dose Route Frequency Provider Last Rate Last Dose  . heparin lock flush 100 unit/mL  500 Units Intracatheter Daily PRN Truitt Merle, MD      . sodium chloride flush (NS) 0.9 % injection 10 mL  10 mL Intracatheter PRN Truitt Merle, MD        Allergies  Allergen Reactions  . Statins Other (See Comments)    Liver enzymes go up whenever on them    ROS As per HPI  PE: Blood pressure 135/71, pulse 68, temperature 97.9 F (36.6 C), temperature source Oral, resp. rate 16, height 5' 9"  (1.753 m), weight 223 lb 8 oz (101.4 kg), SpO2 95 %. Gen: Alert, well appearing.  Patient is oriented to person, place, time, and situation. AFFECT: pleasant, lucid thought and  speech. Color: no signif pallor, no jaundice. Foot exam - bilateral normal; no swelling, tenderness or skin or vascular lesions. Color and temperature is normal. Sensation is intact. Peripheral pulses are palpable. Toenails are normal.   LABS:  Lab Results  Component Value Date   TSH 1.19 05/04/2016   Lab Results  Component Value Date   WBC 7.7 03/27/2017   HGB 10.5 (L) 03/27/2017   HCT 33.3 (L) 03/27/2017   MCV 88.3 03/27/2017   PLT 166 03/27/2017   Lab Results  Component Value Date   CREATININE 1.24 03/27/2017   BUN 9 03/27/2017   NA 137 03/27/2017   K 3.4 (L) 03/27/2017   CL 100 03/27/2017   CO2 26 03/27/2017   Lab Results  Component Value Date   ALT 17 03/27/2017   AST 34 03/27/2017   ALKPHOS 93 03/27/2017   BILITOT 4.8 (HH) 03/27/2017   Lab Results  Component Value Date   CHOL 162 01/25/2017   Lab Results  Component Value Date   HDL 47 01/25/2017   Lab Results  Component Value Date   LDLCALC 100 (H) 01/25/2017   Lab Results  Component Value Date   TRIG 76 01/25/2017   Lab Results  Component Value Date   CHOLHDL 3.4 01/25/2017   Lab Results  Component Value Date   PSA 0.31 06/29/2016   Lab Results  Component Value Date   HGBA1C 5.5 01/04/2017   POC HbA1c today= 4.6 %  IMPRESSION AND PLAN:  1) DM 2: good control as per fasting glucose monitoring at home. His A1c is likely scewed (falsely low) due to all the RBC transfusions/anemia he has had. All in all, no sign of need for change in management: continue diabetic diet. Feet exam normal today. Pt has been reminded to make appt for his annual diab retpthy eye exam.  2) HTN: The current medical regimen is effective;  continue present plan and medications. Recent L[place]/cr stable.  3) HLD: statin contraindicated. LDL 100, HDL 47 about 2 mo ago. Continue good diet, as much exercise as his energy level will allow.  4) Chronic blood loss anemia (GI): ongoing monitoring by Dr. Kale---next f/u  2d. Extensive w/u has not found site of bleeding.  An After Visit Summary was printed and given to  the patient.  FOLLOW UP: Return in about 4 months (around 08/03/2017) for annual CPE (fasting).  Signed:  Crissie Sickles, MD           04/05/2017

## 2017-04-06 ENCOUNTER — Ambulatory Visit: Payer: 59 | Admitting: Family Medicine

## 2017-04-06 NOTE — Progress Notes (Signed)
HEMATOLOGY/ONCOLOGY CLINIC NOTE  Date of Service: 04/07/17  Patient Care Team: Tammi Sou, MD as PCP - General (Family Medicine) Juanita Craver, MD as Consulting Physician (Gastroenterology) Franchot Gallo, MD as Consulting Physician (Urology) Troy Sine, MD as Consulting Physician (Cardiology) Brunetta Genera, MD as Consulting Physician (Hematology) [name], Gaspar Skeeters, MD as Consulting Physician (Gastroenterology)  CHIEF COMPLAINTS/PURPOSE OF CONSULTATION:   F/u for anemia from chronic GI bleeding.  HISTORY OF PRESENTING ILLNESS:   William [name] is a wonderful 67 y.o. male who has been referred to [place] by Dr .Anitra Lauth, Adrian Blackwater, MD for evaluation and management of Anemia.  Patient has a history of extensive medical comorbidities including liver cirrhosis related to [facility] with portal hypertension, esophageal varices and portal hypertensive gastropathy and splenomegaly, iron deficiency anemia, obesity, diabetes, AAA.  Patient was admitted in April 2018 with acute blood loss anemia and required transfusion of multiple units of PRBCs with iron profile suggestive of iron deficiency. No overt evidence of hemolysis noted. He had an EGD and colonoscopy that did not show any overt bleeding. Capsule endoscopy was unrevealing but the capsule past and only 27 minutes.  He follows with Dr. [name] who is his gastric oncologist.  Patient was again readmitted in June 2018 with symptomatically anemia and hemoglobin of 6.3. He underwent 4 units of PRBC transfusions again and was sent out on PPI and iron supplementation with the plan to follow-up with Dr. [name].  He was also given hematology referral. He had a repeat ultrasound of the abdomen on 07/29/2016 which showed significant increase in splenomegaly in 2 months suggesting significant portal hypertension versus some element of splenic sequestration.  He continues to note intermittent black stools.  Hemoglobin is stable and  improved today and is up to 11.1. Patient notes he feels better after his transfusions. Has not noted lower GI bleeding at this time. We discuss any other workup to rule out less likely other possibilities of his anemia.  INTERVAL HISTORY  Patient is here for f/u of his anemia that is primarily related to ongoing issues with GI bleeding. The patient's last visit with [place] was on 03/10/17. He is accompanied today by his wife. The pt reports that he is doing well overall.   He continues to take lysteda (two tablets BID and intermittently TID) without any issues. He will be receiving his IV iron and Sandostatin today. His last transfusion was on 03/08/2017. He notes that he recently had a positive flu test several weeks ago and he was treated with Rx Tamiflu and Rx cough medications. He obtained the pneumonia vaccination. He reports that his recent A1c levels have been great according to his PCP and his blood sugar levels typically range from 90-130. He started Repatha since his last visit and he has had two injections of this medications. He was informed that he will receive labs within the next couple weeks to determine how the medication is working. His cardiologist is ordering this medication as a replacement for a statin medication.   Recent labs on 03/27/2017 showed: Retic Ct Pct at 2.4, RBC at 3.77, Hgb at 10.5 and Ferritin at 147.   On review of systems, pt reports mild fatigue, some black stools on yesterday and previously normal stools 4-5 days prior, and weight loss (20 lbs) due to flu. He denies dizziness, abdominal pain, abdominal distension, leg swelling, and any other symptoms.     MEDICAL HISTORY:  Past Medical History:  Diagnosis Date  .  AAA (abdominal aortic aneurysm) ([place]) 03/2014   3.2 cm on MR abd.  F/u aortic u/s 06/2014 showed 3.0 x 3.1 cm AAA: recheck 2 yrs recommended (followed by cardiologist).  Minimal growth on 10/2016 CT abd done for epig pain.  Repeat u/s 10/2018.  [name] Anemia     . Bilateral renal cysts    simple (03/2014 MRI)  . CAD (coronary artery disease)   . Cholelithiases 2018   asymptomatic  . Chronic diastolic heart failure ([place])   . Cirrhosis ([place]) 05/2016   secondary to NASH; most recent u/s abd 07/2016 showed splenomegaly.  Hx of portal HTN changes with mild ascites and splenomegaly.  . Diabetes mellitus with complication ([place]) 70/3500   A1c 6.8%  . History of blood transfusion 2018 X 4 dates   "low blood" (09/21/2016)  . Hyperlipemia, mixed    elevated LFTs when on statins.    . Hypertension    Cr bump 04/01/16 so I changed benicar-hct to benicar plain and added amlodipine 5 mg.  . Iron deficiency anemia 05/2016   Acute blood loss anemia: hospitalized, required transfusion x 3 U: colonoscopy and capsule study unrevealing.  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  W/u for ? hemolytic anemia to be pursued by hematologist as outpt.  Dr. [name], GI at [facility] following, too---he rec'd onc do bone marrow bx as of Jan 2019  . Microscopic hematuria    Eval unremarkable by Dr. Eulogio Ditch.  [name] NASH (nonalcoholic steatohepatitis)    Fatty liver on MR abd 03/2014, + hx of elevated transaminases and bili: followed by Dr. [name].  . Obesity   . Spleen enlarged     SURGICAL HISTORY: Past Surgical History:  Procedure Laterality Date  . CARDIAC CATHETERIZATION    . CARDIOVASCULAR STRESS TEST  01/17/2012; 05/04/16   Normal stress nuclear study x 2 (2018--Normal perfusion. LVEF 66% with normal wall motion. This is a low risk study).  . CERVICAL DISCECTOMY  1992  . COLONOSCOPY N/A 05/27/2016   No site/explanation for blood loss found.  Erythematous mucosa in cecum and ascending colon--Cecal bx: normal.  Procedure: COLONOSCOPY;  Surgeon: Carol Ada, MD;  Location: [facility] ENDOSCOPY;  Service: Endoscopy;  Laterality: N/A;  . COLONOSCOPY W/ POLYPECTOMY  09/10/2014   Polypectomy x 2: recall 5  yrs (Dr. [name]).  . CORONARY ANGIOPLASTY    . CORONARY ARTERY BYPASS GRAFT  03/2006  . ENTEROSCOPY N/A 07/31/2016   Procedure: ENTEROSCOPY;  Surgeon: Ladene Artist, MD;  Location: [facility] ENDOSCOPY;  Service: Endoscopy;  Laterality: N/A;  . ENTEROSCOPY N/A 12/02/2016   Procedure: ENTEROSCOPY;  Surgeon: Carol Ada, MD;  Location: [facility] ENDOSCOPY;  Service: Endoscopy;  Laterality: N/A;  . ESOPHAGOGASTRODUODENOSCOPY N/A 05/25/2016   No site/explanation for blood loss found.  Procedure: ESOPHAGOGASTRODUODENOSCOPY (EGD);  Surgeon: Juanita Craver, MD;  Location: [facility] ENDOSCOPY;  Service: Endoscopy;  Laterality: N/A;  . ESOPHAGOGASTRODUODENOSCOPY N/A 09/23/2016   Procedure: ESOPHAGOGASTRODUODENOSCOPY (EGD);  Surgeon: Carol Ada, MD;  Location: [place];  Service: Endoscopy;  Laterality: N/A;  . GIVENS CAPSULE STUDY N/A 05/27/2016   No source identified.  Repeat 10/2016--results pending.  Procedure: GIVENS CAPSULE STUDY;  Surgeon: Carol Ada, MD;  Location[facility] ENDOSCOPY;  Service: Endoscopy;  Laterality: N/A;  . GIVENS CAPSULE STUDY N/A 10/15/2016   Procedure: GIVENS CAPSULE STUDY;  Surgeon: Carol Ada, MD;  Location: [facility] ENDOSCOPY;  Service: Endoscopy;  Laterality: N/A;  . GIVENS CAPSULE STUDY N/A  02/13/2017   Procedure: GIVENS CAPSULE STUDY;  Surgeon: Carol Ada, MD;  Location: [place];  Service: Endoscopy;  Laterality: N/A;  . NECK SURGERY  1991  . NM GI BLOOD LOSS  01/27/2017   NEGATIVE  . SMALL BOWEL ENTEROSCOPY  10/2016   ([facility], Dr. [name]: Double balloon enteroscopy--to the level of proximal ileum) jejunal polyps- which were removed--not bleeding & benign.  No source of bleeding was identified.  . TRANSTHORACIC ECHOCARDIOGRAM  05/25/2016   EF 60%, normal wall motion, grd II DD, mild aortic stenosis, dilated aortic root and ascending aorta.  [name] VASECTOMY  1977    SOCIAL HISTORY: Social History   Socioeconomic History  . Marital status: Married    Spouse name: Not on file  . Number of  children: Not on file  . Years of education: Not on file  . Highest education level: Not on file  Social Needs  . Financial resource strain: Not on file  . Food insecurity - worry: Not on file  . Food insecurity - inability: Not on file  . Transportation needs - medical: Not on file  . Transportation needs - non-medical: Not on file  Occupational History  . Not on file  Tobacco Use  . Smoking status: Former Smoker    Types: Cigarettes  . Smokeless tobacco: Never Used  . Tobacco comment: QUIT I 1983  Substance and Sexual Activity  . Alcohol use: No  . Drug use: No  . Sexual activity: Yes  Other Topics Concern  . Not on file  Social History Narrative   Married, 3 grown children, 3 GCs.   Educ: 10th grade.   Occupation: Retired Brewing technologist.   Tob: quit 1983, smoked about 20 pack-yr hx prior.   No alcohol.    FAMILY HISTORY: Family History  Problem Relation Age of Onset  . Hypertension Mother   . Cancer - Other Mother        liver cancer  . Heart disease Father   . Heart attack Father   . Cancer - Lung Father   . Diabetes Father   . Liver disease Sister   . Anemia Sister   . Heart disease Sister   . Heart attack Sister   . Heart disease Brother        CABG 20 YEARS AGO  . Hypertension Brother   . Mesothelioma Brother        HALF-BROTHER  . Cancer - Other Brother        CLL  . Cancer - Lung Brother        Mets to brain  . Cancer - Lung Sister        HALF-SISTER  . Liver disease Brother   . Heart disease Brother        CABG-2012  . Emphysema Maternal Grandfather   . Cancer - Other Paternal Grandmother        Stomach  . Heart attack Paternal Grandfather     ALLERGIES:  is allergic to statins.  MEDICATIONS:  Current Outpatient Medications  Medication Sig Dispense Refill  . acetaminophen (TYLENOL) 325 MG tablet Take 325 mg by mouth every 6 (six) hours as needed for fever (for fever/pain.).     [name] cholecalciferol (VITAMIN D) 1000 units tablet Take 1,000  Units by mouth daily.    [name] docusate sodium (COLACE) 100 MG capsule Take 100-200 mg by mouth 2 (two) times daily as needed (for constipation.).     [name] Evolocumab (REPATHA SURECLICK) 130 MG/ML SOAJ  Inject 140 mg into the skin every 14 (fourteen) days. 2 pen 12  . fluticasone (FLONASE) 50 MCG/ACT nasal spray Place 2 sprays into both nostrils daily. 16 g 6  . metoprolol tartrate (LOPRESSOR) 25 MG tablet Take 0.5 tablets (12.5 mg total) by mouth 2 (two) times daily. 60 tablet 6  . ondansetron (ZOFRAN) 4 MG tablet Take 1 tablet (4 mg total) by mouth every 8 (eight) hours as needed for nausea or vomiting. 10 tablet 1  . pantoprazole (PROTONIX) 40 MG tablet TAKE 1 TABLET BY MOUTH TWICE DAILY BEFORE A MEAL 60 tablet 3  . potassium chloride SA (K-DUR,KLOR-CON) 20 MEQ tablet Take 1 tablet (20 mEq total) by mouth 2 (two) times daily. 60 tablet 6  . spironolactone (ALDACTONE) 25 MG tablet Take 12.5 mg by mouth daily.    [name] torsemide (DEMADEX) 20 MG tablet TAKE 2 TABLETS (40 MG TOTAL) BY MOUTH DAILY. 60 tablet 3  . tranexamic acid (LYSTEDA) 650 MG TABS tablet Take 2 tablets (1,300 mg total) by mouth 3 (three) times daily. 180 tablet 1   No current facility-administered medications for this visit.    Facility-Administered Medications Ordered in Other Visits  Medication Dose Route Frequency Provider Last Rate Last Dose  . heparin lock flush 100 unit/mL  500 Units Intracatheter Daily PRN Truitt Merle, MD      . sodium chloride flush (NS) 0.9 % injection 10 mL  10 mL Intracatheter PRN Truitt Merle, MD        REVIEW OF SYSTEMS:    .10 Point review of Systems was done is negative except as noted above.  PHYSICAL EXAMINATION:   ECOG PERFORMANCE STATUS: 1 - Symptomatic but completely ambulatory  . Vitals:   04/07/17 0911  BP: (!) 124/56  Pulse: 72  Resp: 20  Temp: 97.6 F (36.4 C)  SpO2: 94%   Filed Weights   04/07/17 0911  Weight: 225 lb 11.2 oz (102.4 kg)   .Body mass index is 33.33  kg/m.  [name] GENERAL:alert, in no acute distress and comfortable SKIN: no acute rashes, no significant lesions EYES: conjunctiva are pink and non-injected, sclera faintly icteric. OROPHARYNX: MMM, no exudates, no oropharyngeal erythema or ulceration NECK: supple, no JVD LYMPH:  no palpable lymphadenopathy in the cervical, axillary or inguinal regions LUNGS: clear to auscultation b/l with normal respiratory effort HEART: regular rate & rhythm ABDOMEN:  normoactive bowel sounds , non tender, not distended. Extremity: no pedal edema PSYCH: alert & oriented x 3 with fluent speech NEURO: no focal motor/sensory deficits    LABORATORY DATA:  I have reviewed the data as listed  . CBC Latest Ref Rng & Units 04/07/2017 03/27/2017 03/10/2017  WBC 4.0 - 10.3 K/uL 6.4 7.7 8.2  Hemoglobin 13.0 - 17.1 g/dL - 10.5(L) -  Hematocrit 38.4 - 49.9 % 34.2(L) 33.3(L) 24.3(L)  Platelets 140 - 400 K/uL 150 166 105(L)  Hgb 10.9 . CMP Latest Ref Rng & Units 04/07/2017 03/27/2017 02/24/2017  Glucose 70 - 140 mg/dL 108 124 164(H)  BUN 7 - 26 mg/dL 10 9 10   Creatinine 0.70 - 1.30 mg/dL 1.17 1.24 1.35(H)  Sodium 136 - 145 mmol/L 137 137 137  Potassium 3.5 - 5.1 mmol/L 4.0 3.4(L) 4.0  Chloride 98 - 109 mmol/L 105 100 104  CO2 22 - 29 mmol/L 22 26 25   Calcium 8.4 - 10.4 mg/dL 9.1 8.6 8.3(L)  Total Protein 6.4 - 8.3 g/dL 6.9 6.3(L) 6.1(L)  Total Bilirubin 0.2 - 1.2 mg/dL 5.1(HH) 4.8(HH) 4.2(HH)  Alkaline Phos 40 - 150 U/L 106 93 109  AST 5 - 34 U/L 35(H) 34 28  ALT 0 - 55 U/L 21 17 22     . Lab Results  Component Value Date   IRON 330 (H) 03/10/2017   TIBC 336 03/10/2017   IRONPCTSAT 98 03/10/2017   (Iron and TIBC)  Lab Results  Component Value Date   FERRITIN 147 03/27/2017    RADIOGRAPHIC STUDIES: I have personally reviewed the radiological images as listed and agreed with the findings in the report. No results found.  ASSESSMENT & PLAN:   67 year old male with multiple medical comorbidities with  Karlene Lineman with liver cirrhosis with portal hypertension, esophageal varices and portal hypertensive gastropathy with  #1 Acute on Chronic blood loss anemia.  Patient has had recurrent GI bleeding requiring > 20 units of PRBCs. Between April 2018 and Aug 2018 and continues to have ongoing GI bleeding. Frequency of needing PRBC transfusions has denies some. Previous workup showed LDH within normal limits at 220 and suggests against overt hemolysis. Sedimentation rate within normal limits. Coombs' test is negative. Myeloma panel and serum free light chains suggest no monoclonal paraproteinemia. PNH testing negative Slightly lower haptoglobin levels can be related to decreased hepatic production, low-level hemolysis due to transfusions or extravascular hemolysis due to splenic or hepatic sequestration.  small bowel endoscopy done on 12/02/2016: impression - The examined portion of the jejunum was normal.  - Normal examined duodenum. - Portal hypertensive gastropathy.  -Small (< 5 mm) esophageal varices. - No specimens collected.  Capsule Endoscopy on 02/15/17: No evidence of any bleeding during this examination. There was the possibility of an atypical AVM manifested as an erythematous patch. No evidence of any ulcerations, erosions, masses, or polyps.  Recent rbc tagged study unrevealing. -Ferritin at 147, retic Ct Pct at 2.4, RBC at 3.77, Hgb at 10.5 on 03/27/2017 hgb today stable at 10.9  PLAN --he has had previous extensive GI evaluation with no overt source evidence of controllable GIB at this time. -PRBC transfusion 2 units 03/08/17 -hgb stable at 10.9 today with no indication for PRBC transfusion. -Continue on increased lysteda 1374m po 3x daily given ongoing GI bleeding. -Patient will receive LA sandostatin given as a shot every 4 weeks (As per recommendation of Dr WMalka So- [facility]) -continue f/u with GI -continue monthly IV Iron   continue Sandostatin q4weeks 1st dose today as  scheduled -Labs q2weeks -RTC with Dr KIrene Limboin 4 weeks with labs  All of the patients questions were answered with apparent satisfaction. The patient knows to call the clinic with any problems, questions or concerns.  . The total time spent in the appointment was 15 minutes and more than 50% was on counseling and direct patient cares.     GSullivan LoneMD MBigforkAAHIVMS SSsm Health Rehabilitation HospitalCBryn Mawr HospitalHematology/Oncology Physician [place] (Office):       [phone](Work cell):  [phone](Fax):           [phone] This document serves as a record of services personally performed by GSullivan Lone MD. It was created on his behalf by SSteva Colder a trained medical scribe. The creation of this record is based on the scribe's personal observations and the provider's statements to them.   .I have reviewed the above documentation for accuracy and completeness, and I agree with the above. .Brunetta GeneraMD MS

## 2017-04-07 ENCOUNTER — Inpatient Hospital Stay: Payer: 59

## 2017-04-07 ENCOUNTER — Inpatient Hospital Stay: Payer: 59 | Attending: Hematology

## 2017-04-07 ENCOUNTER — Inpatient Hospital Stay (HOSPITAL_BASED_OUTPATIENT_CLINIC_OR_DEPARTMENT_OTHER): Payer: 59 | Admitting: Hematology

## 2017-04-07 ENCOUNTER — Other Ambulatory Visit: Payer: Self-pay | Admitting: Hematology

## 2017-04-07 VITALS — BP 113/58 | HR 62 | Temp 97.8°F | Resp 18

## 2017-04-07 VITALS — BP 124/56 | HR 72 | Temp 97.6°F | Resp 20 | Ht 69.0 in | Wt 225.7 lb

## 2017-04-07 DIAGNOSIS — K766 Portal hypertension: Secondary | ICD-10-CM | POA: Insufficient documentation

## 2017-04-07 DIAGNOSIS — Z79899 Other long term (current) drug therapy: Secondary | ICD-10-CM | POA: Insufficient documentation

## 2017-04-07 DIAGNOSIS — K7581 Nonalcoholic steatohepatitis (NASH): Secondary | ICD-10-CM | POA: Insufficient documentation

## 2017-04-07 DIAGNOSIS — R161 Splenomegaly, not elsewhere classified: Secondary | ICD-10-CM | POA: Diagnosis not present

## 2017-04-07 DIAGNOSIS — K3189 Other diseases of stomach and duodenum: Secondary | ICD-10-CM | POA: Diagnosis not present

## 2017-04-07 DIAGNOSIS — K922 Gastrointestinal hemorrhage, unspecified: Secondary | ICD-10-CM | POA: Diagnosis not present

## 2017-04-07 DIAGNOSIS — D500 Iron deficiency anemia secondary to blood loss (chronic): Secondary | ICD-10-CM

## 2017-04-07 DIAGNOSIS — D62 Acute posthemorrhagic anemia: Secondary | ICD-10-CM

## 2017-04-07 DIAGNOSIS — K7469 Other cirrhosis of liver: Secondary | ICD-10-CM | POA: Diagnosis not present

## 2017-04-07 DIAGNOSIS — E119 Type 2 diabetes mellitus without complications: Secondary | ICD-10-CM | POA: Diagnosis not present

## 2017-04-07 DIAGNOSIS — I714 Abdominal aortic aneurysm, without rupture: Secondary | ICD-10-CM | POA: Insufficient documentation

## 2017-04-07 DIAGNOSIS — I8510 Secondary esophageal varices without bleeding: Secondary | ICD-10-CM | POA: Insufficient documentation

## 2017-04-07 DIAGNOSIS — E669 Obesity, unspecified: Secondary | ICD-10-CM | POA: Insufficient documentation

## 2017-04-07 LAB — CBC WITH DIFFERENTIAL (CANCER CENTER ONLY)
Basophils Absolute: 0.1 10*3/uL (ref 0.0–0.1)
Basophils Relative: 1 %
Eosinophils Absolute: 0.6 10*3/uL — ABNORMAL HIGH (ref 0.0–0.5)
Eosinophils Relative: 9 %
HEMATOCRIT: 34.2 % — AB (ref 38.4–49.9)
HEMOGLOBIN: 10.9 g/dL — AB (ref 13.0–17.1)
LYMPHS ABS: 1 10*3/uL (ref 0.9–3.3)
Lymphocytes Relative: 16 %
MCH: 27.3 pg (ref 27.2–33.4)
MCHC: 31.9 g/dL — ABNORMAL LOW (ref 32.0–36.0)
MCV: 85.5 fL (ref 79.3–98.0)
MONOS PCT: 13 %
Monocytes Absolute: 0.9 10*3/uL (ref 0.1–0.9)
NEUTROS ABS: 3.9 10*3/uL (ref 1.5–6.5)
NEUTROS PCT: 61 %
Platelet Count: 150 10*3/uL (ref 140–400)
RBC: 4 MIL/uL — AB (ref 4.20–5.82)
RDW: 18.9 % — ABNORMAL HIGH (ref 11.0–14.6)
WBC: 6.4 10*3/uL (ref 4.0–10.3)

## 2017-04-07 LAB — CMP (CANCER CENTER ONLY)
ALT: 21 U/L (ref 0–55)
ANION GAP: 10 (ref 3–11)
AST: 35 U/L — ABNORMAL HIGH (ref 5–34)
Albumin: 2.9 g/dL — ABNORMAL LOW (ref 3.5–5.0)
Alkaline Phosphatase: 106 U/L (ref 40–150)
BUN: 10 mg/dL (ref 7–26)
CHLORIDE: 105 mmol/L (ref 98–109)
CO2: 22 mmol/L (ref 22–29)
CREATININE: 1.17 mg/dL (ref 0.70–1.30)
Calcium: 9.1 mg/dL (ref 8.4–10.4)
GFR, Est AFR Am: >60 mL/min (ref >60)
Glucose, Bld: 108 mg/dL (ref 70–140)
POTASSIUM: 4 mmol/L (ref 3.5–5.1)
SODIUM: 137 mmol/L (ref 136–145)
Total Bilirubin: 5.1 mg/dL (ref 0.2–1.2)
Total Protein: 6.9 g/dL (ref 6.4–8.3)

## 2017-04-07 LAB — RETICULOCYTES
RBC.: 4 MIL/uL — AB (ref 4.20–5.82)
Retic Count, Absolute: 120 10*3/uL — ABNORMAL HIGH (ref 34.8–93.9)
Retic Ct Pct: 3 % — ABNORMAL HIGH (ref 0.8–1.8)

## 2017-04-07 LAB — SAMPLE TO BLOOD BANK

## 2017-04-07 MED ORDER — OCTREOTIDE ACETATE 30 MG IM KIT
PACK | INTRAMUSCULAR | Status: AC
  Filled 2017-04-07: qty 1

## 2017-04-07 MED ORDER — SODIUM CHLORIDE 0.9 % IV SOLN
750.0000 mg | Freq: Once | INTRAVENOUS | Status: AC
  Administered 2017-04-07: 750 mg via INTRAVENOUS
  Filled 2017-04-07: qty 15

## 2017-04-07 MED ORDER — SODIUM CHLORIDE 0.9 % IV SOLN
Freq: Once | INTRAVENOUS | Status: AC
  Administered 2017-04-07: 10:00:00 via INTRAVENOUS

## 2017-04-07 MED ORDER — OCTREOTIDE ACETATE 30 MG IM KIT
30.0000 mg | PACK | Freq: Once | INTRAMUSCULAR | Status: AC
  Administered 2017-04-07: 30 mg via INTRAMUSCULAR

## 2017-04-07 NOTE — Patient Instructions (Signed)
Ferric carboxymaltose injection What is this medicine? FERRIC CARBOXYMALTOSE (ferr-ik car-box-ee-mol-toes) is an iron complex. Iron is used to make healthy red blood cells, which carry oxygen and nutrients throughout the body. This medicine is used to treat anemia in people with chronic kidney disease or people who cannot take iron by mouth. This medicine may be used for other purposes; ask your health care provider or pharmacist if you have questions. COMMON BRAND NAME(S): Injectafer What should I tell my health care provider before I take this medicine? They need to know if you have any of these conditions: -anemia not caused by low iron levels -high levels of iron in the blood -liver disease -an unusual or allergic reaction to iron, other medicines, foods, dyes, or preservatives -pregnant or trying to get pregnant -breast-feeding How should I use this medicine? This medicine is for infusion into a vein. It is given by a health care professional in a hospital or clinic setting. Talk to your pediatrician regarding the use of this medicine in children. Special care may be needed. Overdosage: If you think you have taken too much of this medicine contact a poison control center or emergency room at once. NOTE: This medicine is only for you. Do not share this medicine with others. What if I miss a dose? It is important not to miss your dose. Call your doctor or health care professional if you are unable to keep an appointment. What may interact with this medicine? Do not take this medicine with any of the following medications: -deferoxamine -dimercaprol -other iron products This medicine may also interact with the following medications: -chloramphenicol -deferasirox This list may not describe all possible interactions. Give your health care provider a list of all the medicines, herbs, non-prescription drugs, or dietary supplements you use. Also tell them if you smoke, drink alcohol, or use  illegal drugs. Some items may interact with your medicine. What should I watch for while using this medicine? Visit your doctor or health care professional regularly. Tell your doctor if your symptoms do not start to get better or if they get worse. You may need blood work done while you are taking this medicine. You may need to follow a special diet. Talk to your doctor. Foods that contain iron include: whole grains/cereals, dried fruits, beans, or peas, leafy green vegetables, and organ meats (liver, kidney). What side effects may I notice from receiving this medicine? Side effects that you should report to your doctor or health care professional as soon as possible: -allergic reactions like skin rash, itching or hives, swelling of the face, lips, or tongue -breathing problems -changes in blood pressure -feeling faint or lightheaded, falls -flushing, sweating, or hot feelings Side effects that usually do not require medical attention (report to your doctor or health care professional if they continue or are bothersome): -changes in taste -constipation -dizziness -headache -nausea -pain, redness, or irritation at site where injected -vomiting This list may not describe all possible side effects. Call your doctor for medical advice about side effects. You may report side effects to FDA at 1-800-FDA-1088. Where should I keep my medicine? This drug is given in a hospital or clinic and will not be stored at home. NOTE: This sheet is a summary. It may not cover all possible information. If you have questions about this medicine, talk to your doctor, pharmacist, or health care provider.  2018 Elsevier/Gold Standard (2015-02-26 11:20:47)  

## 2017-04-12 ENCOUNTER — Encounter: Payer: Self-pay | Admitting: Family Medicine

## 2017-04-14 ENCOUNTER — Other Ambulatory Visit: Payer: Self-pay | Admitting: Family Medicine

## 2017-04-14 MED FILL — KLOR-CON M20 TABLET: 20 | 30 days supply | Qty: 60 | Fill #0

## 2017-04-14 NOTE — Telephone Encounter (Signed)
RF request for potassium LOV: 04/05/17 Next ov: None Last written: 06/22/16 #60 w/ 6  Please advise. Thanks.

## 2017-04-21 ENCOUNTER — Inpatient Hospital Stay: Payer: 59

## 2017-04-21 DIAGNOSIS — D500 Iron deficiency anemia secondary to blood loss (chronic): Secondary | ICD-10-CM

## 2017-04-21 DIAGNOSIS — K3189 Other diseases of stomach and duodenum: Secondary | ICD-10-CM | POA: Diagnosis not present

## 2017-04-21 DIAGNOSIS — K922 Gastrointestinal hemorrhage, unspecified: Secondary | ICD-10-CM | POA: Diagnosis not present

## 2017-04-21 DIAGNOSIS — K766 Portal hypertension: Secondary | ICD-10-CM | POA: Diagnosis not present

## 2017-04-21 DIAGNOSIS — R161 Splenomegaly, not elsewhere classified: Secondary | ICD-10-CM | POA: Diagnosis not present

## 2017-04-21 DIAGNOSIS — K7581 Nonalcoholic steatohepatitis (NASH): Secondary | ICD-10-CM | POA: Diagnosis not present

## 2017-04-21 DIAGNOSIS — K7469 Other cirrhosis of liver: Secondary | ICD-10-CM | POA: Diagnosis not present

## 2017-04-21 DIAGNOSIS — E669 Obesity, unspecified: Secondary | ICD-10-CM | POA: Diagnosis not present

## 2017-04-21 DIAGNOSIS — I8510 Secondary esophageal varices without bleeding: Secondary | ICD-10-CM | POA: Diagnosis not present

## 2017-04-21 LAB — CBC WITH DIFFERENTIAL (CANCER CENTER ONLY)
BASOS PCT: 1 %
Basophils Absolute: 0.1 10*3/uL (ref 0.0–0.1)
EOS ABS: 0.8 10*3/uL — AB (ref 0.0–0.5)
EOS PCT: 11 %
HCT: 34.6 % — ABNORMAL LOW (ref 38.4–49.9)
HEMOGLOBIN: 11.3 g/dL — AB (ref 13.0–17.1)
LYMPHS ABS: 1.3 10*3/uL (ref 0.9–3.3)
Lymphocytes Relative: 19 %
MCH: 29.2 pg (ref 27.2–33.4)
MCHC: 32.7 g/dL (ref 32.0–36.0)
MCV: 89.4 fL (ref 79.3–98.0)
Monocytes Absolute: 1 10*3/uL — ABNORMAL HIGH (ref 0.1–0.9)
Monocytes Relative: 14 %
NEUTROS PCT: 55 %
Neutro Abs: 3.8 10*3/uL (ref 1.5–6.5)
PLATELETS: 76 10*3/uL — AB (ref 140–400)
RBC: 3.87 MIL/uL — AB (ref 4.20–5.82)
RDW: 22.1 % — ABNORMAL HIGH (ref 11.0–14.6)
WBC: 6.9 10*3/uL (ref 4.0–10.3)

## 2017-04-21 LAB — RETICULOCYTES
RBC.: 3.87 MIL/uL — ABNORMAL LOW (ref 4.20–5.82)
RETIC CT PCT: 2.9 % — AB (ref 0.8–1.8)
Retic Count, Absolute: 112.2 10*3/uL — ABNORMAL HIGH (ref 34.8–93.9)

## 2017-04-21 LAB — FERRITIN: FERRITIN: 203 ng/mL (ref 22–316)

## 2017-04-28 MED FILL — REPATHA 140 MG/1 ML INJ: 140 | 28 days supply | Qty: 2 | Fill #2

## 2017-05-05 ENCOUNTER — Inpatient Hospital Stay: Payer: 59

## 2017-05-05 ENCOUNTER — Encounter: Payer: Self-pay | Admitting: Hematology

## 2017-05-05 ENCOUNTER — Inpatient Hospital Stay (HOSPITAL_BASED_OUTPATIENT_CLINIC_OR_DEPARTMENT_OTHER): Payer: 59 | Admitting: Hematology

## 2017-05-05 ENCOUNTER — Telehealth: Payer: Self-pay | Admitting: Hematology

## 2017-05-05 VITALS — BP 122/62 | HR 70

## 2017-05-05 VITALS — BP 115/59 | HR 73 | Temp 97.6°F | Resp 18 | Ht 69.0 in | Wt 233.9 lb

## 2017-05-05 DIAGNOSIS — D500 Iron deficiency anemia secondary to blood loss (chronic): Secondary | ICD-10-CM

## 2017-05-05 DIAGNOSIS — K7469 Other cirrhosis of liver: Secondary | ICD-10-CM | POA: Diagnosis not present

## 2017-05-05 DIAGNOSIS — K766 Portal hypertension: Secondary | ICD-10-CM | POA: Diagnosis not present

## 2017-05-05 DIAGNOSIS — K922 Gastrointestinal hemorrhage, unspecified: Secondary | ICD-10-CM

## 2017-05-05 DIAGNOSIS — I8510 Secondary esophageal varices without bleeding: Secondary | ICD-10-CM | POA: Diagnosis not present

## 2017-05-05 DIAGNOSIS — K7581 Nonalcoholic steatohepatitis (NASH): Secondary | ICD-10-CM | POA: Diagnosis not present

## 2017-05-05 DIAGNOSIS — E669 Obesity, unspecified: Secondary | ICD-10-CM | POA: Diagnosis not present

## 2017-05-05 DIAGNOSIS — D62 Acute posthemorrhagic anemia: Secondary | ICD-10-CM | POA: Diagnosis not present

## 2017-05-05 DIAGNOSIS — K3189 Other diseases of stomach and duodenum: Secondary | ICD-10-CM | POA: Diagnosis not present

## 2017-05-05 DIAGNOSIS — R161 Splenomegaly, not elsewhere classified: Secondary | ICD-10-CM | POA: Diagnosis not present

## 2017-05-05 LAB — CBC WITH DIFFERENTIAL (CANCER CENTER ONLY)
Basophils Absolute: 0.1 10*3/uL (ref 0.0–0.1)
Basophils Relative: 2 %
EOS ABS: 0.6 10*3/uL — AB (ref 0.0–0.5)
Eosinophils Relative: 9 %
HEMATOCRIT: 29.8 % — AB (ref 38.4–49.9)
HEMOGLOBIN: 9.4 g/dL — AB (ref 13.0–17.1)
LYMPHS ABS: 0.9 10*3/uL (ref 0.9–3.3)
Lymphocytes Relative: 15 %
MCH: 28.1 pg (ref 27.2–33.4)
MCHC: 31.5 g/dL — AB (ref 32.0–36.0)
MCV: 89.2 fL (ref 79.3–98.0)
MONO ABS: 0.8 10*3/uL (ref 0.1–0.9)
MONOS PCT: 13 %
NEUTROS PCT: 61 %
Neutro Abs: 3.7 10*3/uL (ref 1.5–6.5)
Platelet Count: 102 10*3/uL — ABNORMAL LOW (ref 140–400)
RBC: 3.34 MIL/uL — ABNORMAL LOW (ref 4.20–5.82)
RDW: 19.2 % — AB (ref 11.0–14.6)
WBC Count: 6.2 10*3/uL (ref 4.0–10.3)

## 2017-05-05 LAB — CMP (CANCER CENTER ONLY)
ALBUMIN: 2.8 g/dL — AB (ref 3.5–5.0)
ALK PHOS: 98 U/L (ref 40–150)
ALT: 25 U/L (ref 0–55)
ANION GAP: 7 (ref 3–11)
AST: 30 U/L (ref 5–34)
BUN: 12 mg/dL (ref 7–26)
CALCIUM: 8.4 mg/dL (ref 8.4–10.4)
CO2: 23 mmol/L (ref 22–29)
Chloride: 108 mmol/L (ref 98–109)
Creatinine: 1.15 mg/dL (ref 0.70–1.30)
GFR, Estimated: >60 mL/min (ref >60)
GLUCOSE: 109 mg/dL (ref 70–140)
Potassium: 3.7 mmol/L (ref 3.5–5.1)
SODIUM: 138 mmol/L (ref 136–145)
TOTAL PROTEIN: 6.1 g/dL — AB (ref 6.4–8.3)
Total Bilirubin: 3.6 mg/dL (ref 0.2–1.2)

## 2017-05-05 LAB — RETICULOCYTES
RBC.: 3.34 MIL/uL — ABNORMAL LOW (ref 4.20–5.82)
RETIC CT PCT: 3.2 % — AB (ref 0.8–1.8)
Retic Count, Absolute: 106.9 10*3/uL — ABNORMAL HIGH (ref 34.8–93.9)

## 2017-05-05 MED ORDER — SODIUM CHLORIDE 0.9 % IV SOLN
750.0000 mg | Freq: Once | INTRAVENOUS | Status: AC
  Administered 2017-05-05: 750 mg via INTRAVENOUS
  Filled 2017-05-05: qty 15

## 2017-05-05 MED ORDER — OCTREOTIDE ACETATE 30 MG IM KIT
PACK | INTRAMUSCULAR | Status: AC
  Filled 2017-05-05: qty 1

## 2017-05-05 MED ORDER — OCTREOTIDE ACETATE 30 MG IM KIT
30.0000 mg | PACK | Freq: Once | INTRAMUSCULAR | Status: AC
  Administered 2017-05-05: 30 mg via INTRAMUSCULAR

## 2017-05-05 NOTE — Telephone Encounter (Signed)
apts already scheduled per 3/29 los - cancelled extra appts that were not needed - patient to get an updated scheduling treatment area.

## 2017-05-05 NOTE — Progress Notes (Signed)
HEMATOLOGY/ONCOLOGY CLINIC NOTE  Date of Service: 05/05/17  Patient Care Team: Tammi Sou, MD as PCP - General (Family Medicine) Juanita Craver, MD as Consulting Physician (Gastroenterology) Franchot Gallo, MD as Consulting Physician (Urology) Troy Sine, MD as Consulting Physician (Cardiology) Brunetta Genera, MD as Consulting Physician (Hematology) [name], Gaspar Skeeters, MD as Consulting Physician (Gastroenterology)  CHIEF COMPLAINTS/PURPOSE OF CONSULTATION:   F/u for anemia and ongoing GI bleeding.  HISTORY OF PRESENTING ILLNESS:   William [name] is a wonderful 67 y.o. male who has been referred to [place] by Dr .Anitra Lauth, Adrian Blackwater, MD for evaluation and management of Anemia.  Patient has a history of extensive medical comorbidities including liver cirrhosis related to [facility] with portal hypertension, esophageal varices and portal hypertensive gastropathy and splenomegaly, iron deficiency anemia, obesity, diabetes, AAA.  Patient was admitted in April 2018 with acute blood loss anemia and required transfusion of multiple units of PRBCs with iron profile suggestive of iron deficiency. No overt evidence of hemolysis noted. He had an EGD and colonoscopy that did not show any overt bleeding. Capsule endoscopy was unrevealing but the capsule past and only 27 minutes.  He follows with Dr. [name] who is his gastric oncologist.  Patient was again readmitted in June 2018 with symptomatically anemia and hemoglobin of 6.3. He underwent 4 units of PRBC transfusions again and was sent out on PPI and iron supplementation with the plan to follow-up with Dr. [name].  He was also given hematology referral. He had a repeat ultrasound of the abdomen on 07/29/2016 which showed significant increase in splenomegaly in 2 months suggesting significant portal hypertension versus some element of splenic sequestration.  He continues to note intermittent black stools.  Hemoglobin is stable and  improved today and is up to 11.1. Patient notes he feels better after his transfusions. Has not noted lower GI bleeding at this time. We discuss any other workup to rule out less likely other possibilities of his anemia.  INTERVAL HISTORY  [name] is here for a scheduled follow-up of his anemia that is primarily related to ongoing issues with GI bleeding and has not been controllable by multiple GI interventions. He is accompanied by his wife. He reports mild shaking in his arms that has improved. He states that he relates it to when his blood is low. He tolerated his sandostatin shot well and his blood work was stable for the first couple of weeks after. The last two weeks his labs have mildly dropped. He reports having dark stool for the last few days. He continues to take lysteda intermittently throughout the day. He has a hard time remembering to take his medication. He reports taking naps throughout the day. Overall, he is doing well.  Recent labs on 05/05/2017 showed: Retic Ct Pct at 3.2, RBC at 3.34, and Hgb at 9.4.   On review of systems, pt denies fever, chills, weight loss, decreased appetite, decreased energy levels, mouth sores and bilateral lower extremity edema. Denies pain. Pt denies abdominal pain, nausea, vomiting and bladder and bowel changes.    MEDICAL HISTORY:  Past Medical History:  Diagnosis Date  . AAA (abdominal aortic aneurysm) ([place]) 03/2014   3.2 cm on MR abd.  F/u aortic u/s 06/2014 showed 3.0 x 3.1 cm AAA: recheck 2 yrs recommended (followed by cardiologist).  Minimal growth on 10/2016 CT abd done for epig pain.  Repeat u/s 10/2018.  [name] Anemia due to chronic blood loss 2018/19   GI: transfusions x >  20 required; multiple endoscopies and bleeding scans unrevealing.  Hemolysis ruled out by hematologist.  Marshell Levan and octreotide + monthly iron infusions as of 04/2017.  . Bilateral renal cysts    simple (03/2014 MRI)  . CAD (coronary artery disease)   . Cholelithiases 2018    asymptomatic  . Chronic diastolic heart failure ([place])   . Cirrhosis ([place]) 05/2016   secondary to NASH; most recent u/s abd 07/2016 showed splenomegaly.  Hx of portal HTN changes with mild ascites and splenomegaly.  . Diabetes mellitus with complication ([place]) 67/6195   A1c 6.8%  . History of blood transfusion 2018 X 4 dates   "low blood" (09/21/2016)  . Hyperlipemia, mixed    elevated LFTs when on statins.    . Hypertension    Cr bump 04/01/16 so I changed benicar-hct to benicar plain and added amlodipine 5 mg.  . Iron deficiency anemia 05/2016   Acute blood loss anemia: hospitalized, required transfusion x 3 U: colonoscopy and capsule study unrevealing.  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  W/u for ? hemolytic anemia to be pursued by hematologist as outpt.  Dr. [name], GI at [facility] following, too---he rec'd onc do bone marrow bx as of Jan 2019  . Microscopic hematuria    Eval unremarkable by Dr. Eulogio Ditch.  [name] NASH (nonalcoholic steatohepatitis)    Fatty liver on MR abd 03/2014, + hx of elevated transaminases and bili: followed by Dr. [name].  . Obesity   . Spleen enlarged     SURGICAL HISTORY: Past Surgical History:  Procedure Laterality Date  . CARDIAC CATHETERIZATION    . CARDIOVASCULAR STRESS TEST  01/17/2012; 05/04/16   Normal stress nuclear study x 2 (2018--Normal perfusion. LVEF 66% with normal wall motion. This is a low risk study).  . CERVICAL DISCECTOMY  1992  . COLONOSCOPY N/A 05/27/2016   No site/explanation for blood loss found.  Erythematous mucosa in cecum and ascending colon--Cecal bx: normal.  Procedure: COLONOSCOPY;  Surgeon: Carol Ada, MD;  Location: [facility] ENDOSCOPY;  Service: Endoscopy;  Laterality: N/A;  . COLONOSCOPY W/ POLYPECTOMY  09/10/2014   Polypectomy x 2: recall 5 yrs (Dr. [name]).  . CORONARY ANGIOPLASTY    . CORONARY ARTERY BYPASS GRAFT  03/2006  . ENTEROSCOPY N/A  07/31/2016   Procedure: ENTEROSCOPY;  Surgeon: Ladene Artist, MD;  Location: [facility] ENDOSCOPY;  Service: Endoscopy;  Laterality: N/A;  . ENTEROSCOPY N/A 12/02/2016   Procedure: ENTEROSCOPY;  Surgeon: Carol Ada, MD;  Location: [facility] ENDOSCOPY;  Service: Endoscopy;  Laterality: N/A;  . ESOPHAGOGASTRODUODENOSCOPY N/A 05/25/2016   No site/explanation for blood loss found.  Procedure: ESOPHAGOGASTRODUODENOSCOPY (EGD);  Surgeon: Juanita Craver, MD;  Location: [facility] ENDOSCOPY;  Service: Endoscopy;  Laterality: N/A;  . ESOPHAGOGASTRODUODENOSCOPY N/A 09/23/2016   Procedure: ESOPHAGOGASTRODUODENOSCOPY (EGD);  Surgeon: Carol Ada, MD;  Location: [place];  Service: Endoscopy;  Laterality: N/A;  . GIVENS CAPSULE STUDY N/A 05/27/2016   No source identified.  Repeat 10/2016--results pending.  Procedure: GIVENS CAPSULE STUDY;  Surgeon: Carol Ada, MD;  Location: [facility] ENDOSCOPY;  Service: Endoscopy;  Laterality: N/A;  . GIVENS CAPSULE STUDY N/A 10/15/2016   Procedure: GIVENS CAPSULE STUDY;  Surgeon: Carol Ada, MD;  Location: [facility] ENDOSCOPY;  Service: Endoscopy;  Laterality: N/A;  . GIVENS CAPSULE STUDY N/A 02/13/2017   Procedure: GIVENS CAPSULE STUDY;  Surgeon: Carol Ada, MD;  Location: [place];  Service: Endoscopy;  Laterality: N/A;  . NECK SURGERY  1991  . NM GI BLOOD LOSS  01/27/2017   NEGATIVE  . SMALL BOWEL ENTEROSCOPY  10/2016   ([facility], Dr. [name]: Double balloon enteroscopy--to the level of proximal ileum) jejunal polyps- which were removed--not bleeding & benign.  No source of bleeding was identified.  . TRANSTHORACIC ECHOCARDIOGRAM  05/25/2016   EF 60%, normal wall motion, grd II DD, mild aortic stenosis, dilated aortic root and ascending aorta.  [name] VASECTOMY  1977    SOCIAL HISTORY: Social History   Socioeconomic History  . Marital status: Married    Spouse name: Not on file  . Number of children: Not on file  . Years of education: Not on file  . Highest education level: Not on file    Occupational History  . Not on file  Social Needs  . Financial resource strain: Not on file  . Food insecurity:    Worry: Not on file    Inability: Not on file  . Transportation needs:    Medical: Not on file    Non-medical: Not on file  Tobacco Use  . Smoking status: Former Smoker    Types: Cigarettes  . Smokeless tobacco: Never Used  . Tobacco comment: QUIT I 1983  Substance and Sexual Activity  . Alcohol use: No  . Drug use: No  . Sexual activity: Yes  Lifestyle  . Physical activity:    Days per week: 0 days    Minutes per session: 0 min  . Stress: Not on file  Relationships  . Social connections:    Talks on phone: Not on file    Gets together: Not on file    Attends religious service: Not on file    Active member of club or organization: Not on file    Attends meetings of clubs or organizations: Not on file    Relationship status: Not on file  . Intimate partner violence:    Fear of current or ex partner: Not on file    Emotionally abused: Not on file    Physically abused: Not on file    Forced sexual activity: Not on file  Other Topics Concern  . Not on file  Social History Narrative   Married, 3 grown children, 3 GCs.   Educ: 10th grade.   Occupation: Retired Brewing technologist.   Tob: quit 1983, smoked about 20 pack-yr hx prior.   No alcohol.    FAMILY HISTORY: Family History  Problem Relation Age of Onset  . Hypertension Mother   . Cancer - Other Mother        liver cancer  . Heart disease Father   . Heart attack Father   . Cancer - Lung Father   . Diabetes Father   . Liver disease Sister   . Anemia Sister   . Heart disease Sister   . Heart attack Sister   . Heart disease Brother        CABG 20 YEARS AGO  . Hypertension Brother   . Mesothelioma Brother        HALF-BROTHER  . Cancer - Other Brother        CLL  . Cancer - Lung Brother        Mets to brain  . Cancer - Lung Sister        HALF-SISTER  . Liver disease Brother   . Heart disease  Brother        CABG-2012  . Emphysema Maternal Grandfather   . Cancer - Other Paternal Grandmother  Stomach  . Heart attack Paternal Grandfather     ALLERGIES:  is allergic to statins.  MEDICATIONS:  Current Outpatient Medications  Medication Sig Dispense Refill  . acetaminophen (TYLENOL) 325 MG tablet Take 325 mg by mouth every 6 (six) hours as needed for fever (for fever/pain.).     [name] cholecalciferol (VITAMIN D) 1000 units tablet Take 1,000 Units by mouth daily.    [name] docusate sodium (COLACE) 100 MG capsule Take 100-200 mg by mouth 2 (two) times daily as needed (for constipation.).     [name] Evolocumab (REPATHA SURECLICK) 202 MG/ML SOAJ Inject 140 mg into the skin every 14 (fourteen) days. 2 pen 12  . fluticasone (FLONASE) 50 MCG/ACT nasal spray Place 2 sprays into both nostrils daily. 16 g 6  . metoprolol tartrate (LOPRESSOR) 25 MG tablet Take 0.5 tablets (12.5 mg total) by mouth 2 (two) times daily. 60 tablet 6  . ondansetron (ZOFRAN) 4 MG tablet Take 1 tablet (4 mg total) by mouth every 8 (eight) hours as needed for nausea or vomiting. 10 tablet 1  . pantoprazole (PROTONIX) 40 MG tablet TAKE 1 TABLET BY MOUTH TWICE DAILY BEFORE A MEAL 60 tablet 3  . potassium chloride SA (K-DUR,KLOR-CON) 20 MEQ tablet TAKE 1 TABLET (20 MEQ TOTAL) BY MOUTH TWICE A DAY 60 tablet 6  . spironolactone (ALDACTONE) 25 MG tablet Take 12.5 mg by mouth daily.    [name] torsemide (DEMADEX) 20 MG tablet TAKE 2 TABLETS (40 MG TOTAL) BY MOUTH DAILY. 60 tablet 3  . tranexamic acid (LYSTEDA) 650 MG TABS tablet Take 2 tablets (1,300 mg total) by mouth 3 (three) times daily. 180 tablet 1   No current facility-administered medications for this visit.    Facility-Administered Medications Ordered in Other Visits  Medication Dose Route Frequency Provider Last Rate Last Dose  . heparin lock flush 100 unit/mL  500 Units Intracatheter Daily PRN Truitt Merle, MD      . sodium chloride flush (NS) 0.9 % injection 10 mL  10 mL  Intracatheter PRN Truitt Merle, MD        REVIEW OF SYSTEMS:    .10 Point review of Systems was done is negative except as noted above.   PHYSICAL EXAMINATION:   ECOG PERFORMANCE STATUS: 1 - Symptomatic but completely ambulatory  . Vitals:   05/05/17 0911  BP: (!) 115/59  Pulse: 73  Resp: 18  Temp: 97.6 F (36.4 C)  SpO2: 95%   Filed Weights   05/05/17 0911  Weight: 233 lb 14.4 oz (106.1 kg)   .Body mass index is 34.54 kg/m. [name] GENERAL:alert, in no acute distress and comfortable SKIN: no acute rashes, no significant lesions EYES: conjunctiva are pink and non-injected, sclera anicteric OROPHARYNX: MMM, no exudates, no oropharyngeal erythema or ulceration NECK: supple, no JVD LYMPH:  no palpable lymphadenopathy in the cervical, axillary or inguinal regions LUNGS: clear to auscultation b/l with normal respiratory effort HEART: regular rate & rhythm ABDOMEN:  normoactive bowel sounds , non tender, not distended. Extremity: no pedal edema PSYCH: alert & oriented x 3 with fluent speech NEURO: no focal motor/sensory deficits  LABORATORY DATA:  I have reviewed the data as listed  . CBC Latest Ref Rng & Units 05/05/2017 04/21/2017 04/07/2017  WBC 4.0 - 10.3 K/uL 6.2 6.9 6.4  Hemoglobin 13.0 - 17.1 g/dL - - -  Hematocrit 38.4 - 49.9 % 29.8(L) 34.6(L) 34.2(L)  Platelets 140 - 400 K/uL 102(L) 76(L) 150  Hgb 9.4 . CMP Latest Ref Rng &  Units 05/05/2017 04/07/2017 03/27/2017  Glucose 70 - 140 mg/dL 109 108 124  BUN 7 - 26 mg/dL 12 10 9   Creatinine 0.70 - 1.30 mg/dL 1.15 1.17 1.24  Sodium 136 - 145 mmol/L 138 137 137  Potassium 3.5 - 5.1 mmol/L 3.7 4.0 3.4(L)  Chloride 98 - 109 mmol/L 108 105 100  CO2 22 - 29 mmol/L 23 22 26   Calcium 8.4 - 10.4 mg/dL 8.4 9.1 8.6  Total Protein 6.4 - 8.3 g/dL 6.1(L) 6.9 6.3(L)  Total Bilirubin 0.2 - 1.2 mg/dL 3.6(HH) 5.1(HH) 4.8(HH)  Alkaline Phos 40 - 150 U/L 98 106 93  AST 5 - 34 U/L 30 35(H) 34  ALT 0 - 55 U/L 25 21 17     Lab Results    Component Value Date   FERRITIN 203 04/21/2017    RADIOGRAPHIC STUDIES: I have personally reviewed the radiological images as listed and agreed with the findings in the report. No results found.  ASSESSMENT & PLAN:   67 y.o. male with multiple medical comorbidities with Karlene Lineman with liver cirrhosis with portal hypertension, esophageal varices and portal hypertensive gastropathy with ongoing chronic GI bleeding that has not been   #1 Acute on Chronic blood loss anemia.  Patient has had recurrent GI bleeding requiring > 20 units of PRBCs. Between April 2018 and Aug 2018 and continues to have ongoing GI bleeding.  Previous workup showed LDH within normal limits at 220 and suggests against overt hemolysis. Sedimentation rate within normal limits. Coombs' test is negative. Myeloma panel and serum free light chains suggest no monoclonal paraproteinemia. PNH testing negative Slightly lower haptoglobin levels can be related to decreased hepatic production, low-level hemolysis due to transfusions or extravascular hemolysis due to splenic or hepatic sequestration.  small bowel endoscopy done on 12/02/2016: impression - The examined portion of the jejunum was normal.  - Normal examined duodenum. - Portal hypertensive gastropathy.  -Small (< 5 mm) esophageal varices. - No specimens collected.  Capsule Endoscopy on 02/15/17: No evidence of any bleeding during this examination. There was the possibility of an atypical AVM manifested as an erythematous patch. No evidence of any ulcerations, erosions, masses, or polyps.  Recent rbc tagged study unrevealing. Recent labs on 05/05/2017 showed: Retic Ct Pct at 3.2, RBC at 3.34, and Hgb at 9.4.  PLAN  -overall his PRBC transfusion requirements have decreased. -still having clinical GI losses. --he has had previous extensive GI evaluation with no overt source evidence of controllable GIB at this time. -hgb stable aa little lower at 9.4 today with no  indication for PRBC transfusion. -Continue on increased lysteda 1333m po 3x daily given ongoing GI bleeding. -continue LA sandostatin given as a shot every 4 weeks (As per recommendation of Dr WMalka So- [facility]) -continue f/u with GI -continue monthly IV Iron   Continue labs q2weeks Continue Sandostatin and IV injectafer q4weeks RTC with Dr KIrene Limboin 4 weeks with labs   All of the patients questions were answered with apparent satisfaction. The patient knows to call the clinic with any problems, questions or concerns.  . The total time spent in the appointment was 20 minutes and more than 50% was on counseling and direct patient cares.     GSullivan LoneMD MScarbroAAHIVMS SC[facility] LLCC[facility] ScHematology/Oncology Physician CMad[facility] Surgery Center Inc (Office):       [phone](Work cell):  [phone](Fax):           [phone] This document serves as a record of services  personally performed by Sullivan Lone, MD. It was created on his behalf by Margit Banda, a trained medical scribe. The creation of this record is based on the scribe's personal observations and the provider's statements to them.   .I have reviewed the above documentation for accuracy and completeness, and I agree with the above. Brunetta Genera MD MS

## 2017-05-05 NOTE — Progress Notes (Signed)
Per MD Irene Limbo ok to treat with bilirubin 3.6

## 2017-05-05 NOTE — Patient Instructions (Signed)
Octreotide injection solution What is this medicine? OCTREOTIDE (ok TREE oh tide) is used to reduce blood levels of growth hormone in patients with a condition called acromegaly. This medicine also reduces flushing and watery diarrhea caused by certain types of cancer. This medicine may be used for other purposes; ask your health care provider or pharmacist if you have questions. COMMON BRAND NAME(S): Sandostatin, Sandostatin LAR What should I tell my health care provider before I take this medicine? They need to know if you have any of these conditions: -gallbladder disease -kidney disease -liver disease -an unusual or allergic reaction to octreotide, other medicines, foods, dyes, or preservatives -pregnant or trying to get pregnant -breast-feeding How should I use this medicine? This medicine is for injection under the skin or into a vein (only in emergency situations). It is usually given by a health care professional in a hospital or clinic setting. If you get this medicine at home, you will be taught how to prepare and give this medicine. Allow the injection solution to come to room temperature before use. Do not warm it artificially. Use exactly as directed. Take your medicine at regular intervals. Do not take your medicine more often than directed. It is important that you put your used needles and syringes in a special sharps container. Do not put them in a trash can. If you do not have a sharps container, call your pharmacist or healthcare provider to get one. Talk to your pediatrician regarding the use of this medicine in children. Special care may be needed. Overdosage: If you think you have taken too much of this medicine contact a poison control center or emergency room at once. NOTE: This medicine is only for you. Do not share this medicine with others. What if I miss a dose? If you miss a dose, take it as soon as you can. If it is almost time for your next dose, take only that  dose. Do not take double or extra doses. What may interact with this medicine? Do not take this medicine with any of the following medications: -cisapride -droperidol -general anesthetics -grepafloxacin -perphenazine -thioridazine This medicine may also interact with the following medications: -bromocriptine -cyclosporine -diuretics -medicines for blood pressure, heart disease, irregular heart beat -medicines for diabetes, including insulin -quinidine This list may not describe all possible interactions. Give your health care provider a list of all the medicines, herbs, non-prescription drugs, or dietary supplements you use. Also tell them if you smoke, drink alcohol, or use illegal drugs. Some items may interact with your medicine. What should I watch for while using this medicine? Visit your doctor or health care professional for regular checks on your progress. To help reduce irritation at the injection site, use a different site for each injection and make sure the solution is at room temperature before use. This medicine may cause increases or decreases in blood sugar. Signs of high blood sugar include frequent urination, unusual thirst, flushed or dry skin, difficulty breathing, drowsiness, stomach ache, nausea, vomiting or dry mouth. Signs of low blood sugar include chills, cool, pale skin or cold sweats, drowsiness, extreme hunger, fast heartbeat, headache, nausea, nervousness or anxiety, shakiness, trembling, unsteadiness, tiredness, or weakness. Contact your doctor or health care professional right away if you experience any of these symptoms. What side effects may I notice from receiving this medicine? Side effects that you should report to your doctor or health care professional as soon as possible: -allergic reactions like skin rash, itching or hives, swelling  of the face, lips, or tongue -changes in blood sugar -changes in heart rate -severe stomach pain Side effects that  usually do not require medical attention (report to your doctor or health care professional if they continue or are bothersome): -diarrhea or constipation -gas or stomach pain -nausea, vomiting -pain, redness, swelling and irritation at site where injected This list may not describe all possible side effects. Call your doctor for medical advice about side effects. You may report side effects to FDA at 1-800-FDA-1088. Where should I keep my medicine? Keep out of the reach of children. Store in a refrigerator between 2 and 8 degrees C (36 and 46 degrees F). Protect from light. Allow to come to room temperature naturally. Do not use artificial heat. If protected from light, the injection may be stored at room temperature between 20 and 30 degrees C (70 and 86 degrees F) for 14 days. After the initial use, throw away any unused portion of a multiple dose vial after 14 days. Throw away unused portions of the ampules after use. NOTE: This sheet is a summary. It may not cover all possible information. If you have questions about this medicine, talk to your doctor, pharmacist, or health care provider.  2018 Elsevier/Gold Standard (2007-08-21 16:56:04)    Ferric carboxymaltose injection What is this medicine? FERRIC CARBOXYMALTOSE (ferr-ik car-box-ee-mol-toes) is an iron complex. Iron is used to make healthy red blood cells, which carry oxygen and nutrients throughout the body. This medicine is used to treat anemia in people with chronic kidney disease or people who cannot take iron by mouth. This medicine may be used for other purposes; ask your health care provider or pharmacist if you have questions. COMMON BRAND NAME(S): Injectafer What should I tell my health care provider before I take this medicine? They need to know if you have any of these conditions: -anemia not caused by low iron levels -high levels of iron in the blood -liver disease -an unusual or allergic reaction to iron, other  medicines, foods, dyes, or preservatives -pregnant or trying to get pregnant -breast-feeding How should I use this medicine? This medicine is for infusion into a vein. It is given by a health care professional in a hospital or clinic setting. Talk to your pediatrician regarding the use of this medicine in children. Special care may be needed. Overdosage: If you think you have taken too much of this medicine contact a poison control center or emergency room at once. NOTE: This medicine is only for you. Do not share this medicine with others. What if I miss a dose? It is important not to miss your dose. Call your doctor or health care professional if you are unable to keep an appointment. What may interact with this medicine? Do not take this medicine with any of the following medications: -deferoxamine -dimercaprol -other iron products This medicine may also interact with the following medications: -chloramphenicol -deferasirox This list may not describe all possible interactions. Give your health care provider a list of all the medicines, herbs, non-prescription drugs, or dietary supplements you use. Also tell them if you smoke, drink alcohol, or use illegal drugs. Some items may interact with your medicine. What should I watch for while using this medicine? Visit your doctor or health care professional regularly. Tell your doctor if your symptoms do not start to get better or if they get worse. You may need blood work done while you are taking this medicine. You may need to follow a special diet. Talk  to your doctor. Foods that contain iron include: whole grains/cereals, dried fruits, beans, or peas, leafy green vegetables, and organ meats (liver, kidney). What side effects may I notice from receiving this medicine? Side effects that you should report to your doctor or health care professional as soon as possible: -allergic reactions like skin rash, itching or hives, swelling of the face,  lips, or tongue -breathing problems -changes in blood pressure -feeling faint or lightheaded, falls -flushing, sweating, or hot feelings Side effects that usually do not require medical attention (report to your doctor or health care professional if they continue or are bothersome): -changes in taste -constipation -dizziness -headache -nausea -pain, redness, or irritation at site where injected -vomiting This list may not describe all possible side effects. Call your doctor for medical advice about side effects. You may report side effects to FDA at 1-800-FDA-1088. Where should I keep my medicine? This drug is given in a hospital or clinic and will not be stored at home. NOTE: This sheet is a summary. It may not cover all possible information. If you have questions about this medicine, talk to your doctor, pharmacist, or health care provider.  2018 Elsevier/Gold Standard (2015-02-26 11:20:47)

## 2017-05-09 MED FILL — TRANEXAMIC ACID 650 MG TAB: 650 | 30 days supply | Qty: 180 | Fill #1

## 2017-05-11 ENCOUNTER — Encounter (HOSPITAL_COMMUNITY): Payer: Self-pay | Admitting: Emergency Medicine

## 2017-05-11 ENCOUNTER — Emergency Department (HOSPITAL_COMMUNITY)
Admission: EM | Admit: 2017-05-11 | Discharge: 2017-05-11 | Disposition: A | Discharge status: Home / Self Care | Payer: 59 | Attending: Emergency Medicine | Admitting: Emergency Medicine

## 2017-05-11 DIAGNOSIS — I2510 Atherosclerotic heart disease of native coronary artery without angina pectoris: Secondary | ICD-10-CM | POA: Insufficient documentation

## 2017-05-11 DIAGNOSIS — K7290 Hepatic failure, unspecified without coma: Secondary | ICD-10-CM

## 2017-05-11 DIAGNOSIS — E119 Type 2 diabetes mellitus without complications: Secondary | ICD-10-CM | POA: Insufficient documentation

## 2017-05-11 DIAGNOSIS — I110 Hypertensive heart disease with heart failure: Secondary | ICD-10-CM | POA: Diagnosis not present

## 2017-05-11 DIAGNOSIS — K7682 Hepatic encephalopathy: Secondary | ICD-10-CM

## 2017-05-11 DIAGNOSIS — K7200 Acute and subacute hepatic failure without coma: Secondary | ICD-10-CM | POA: Diagnosis not present

## 2017-05-11 DIAGNOSIS — Z87891 Personal history of nicotine dependence: Secondary | ICD-10-CM | POA: Insufficient documentation

## 2017-05-11 DIAGNOSIS — R4182 Altered mental status, unspecified: Secondary | ICD-10-CM | POA: Diagnosis present

## 2017-05-11 DIAGNOSIS — Z951 Presence of aortocoronary bypass graft: Secondary | ICD-10-CM | POA: Diagnosis not present

## 2017-05-11 DIAGNOSIS — Z79899 Other long term (current) drug therapy: Secondary | ICD-10-CM | POA: Insufficient documentation

## 2017-05-11 DIAGNOSIS — I5032 Chronic diastolic (congestive) heart failure: Secondary | ICD-10-CM | POA: Diagnosis not present

## 2017-05-11 LAB — COMPREHENSIVE METABOLIC PANEL
ALBUMIN: 3.1 g/dL — AB (ref 3.5–5.0)
ALT: 27 U/L (ref 17–63)
ANION GAP: 8 (ref 5–15)
AST: 38 U/L (ref 15–41)
Alkaline Phosphatase: 112 U/L (ref 38–126)
BUN: 12 mg/dL (ref 6–20)
CHLORIDE: 109 mmol/L (ref 101–111)
CO2: 21 mmol/L — ABNORMAL LOW (ref 22–32)
Calcium: 8.5 mg/dL — ABNORMAL LOW (ref 8.9–10.3)
Creatinine, Ser: 1.32 mg/dL — ABNORMAL HIGH (ref 0.61–1.24)
GFR calc Af Amer: >60 mL/min (ref >60)
GFR calc non Af Amer: 54 mL/min — ABNORMAL LOW (ref >60)
GLUCOSE: 175 mg/dL — AB (ref 65–99)
POTASSIUM: 3.8 mmol/L (ref 3.5–5.1)
Sodium: 138 mmol/L (ref 135–145)
Total Bilirubin: 5.1 mg/dL — ABNORMAL HIGH (ref 0.3–1.2)
Total Protein: 6.8 g/dL (ref 6.5–8.1)

## 2017-05-11 LAB — CBC
HEMATOCRIT: 32.9 % — AB (ref 39.0–52.0)
HEMOGLOBIN: 10.3 g/dL — AB (ref 13.0–17.0)
MCH: 28.7 pg (ref 26.0–34.0)
MCHC: 31.3 g/dL (ref 30.0–36.0)
MCV: 91.6 fL (ref 78.0–100.0)
Platelets: 123 10*3/uL — ABNORMAL LOW (ref 150–400)
RBC: 3.59 MIL/uL — ABNORMAL LOW (ref 4.22–5.81)
RDW: 21.7 % — ABNORMAL HIGH (ref 11.5–15.5)
WBC: 7.9 10*3/uL (ref 4.0–10.5)

## 2017-05-11 LAB — CBG MONITORING, ED: Glucose-Capillary: 154 mg/dL — ABNORMAL HIGH (ref 65–99)

## 2017-05-11 LAB — AMMONIA: AMMONIA: 139 umol/L — AB (ref 9–35)

## 2017-05-11 MED ORDER — LACTULOSE 10 GM/15ML PO SOLN: 30.0000 g | Freq: Three times a day (TID) | ORAL | 0 refills | Status: DC

## 2017-05-11 NOTE — ED Triage Notes (Signed)
Pt from home with symptoms of confusion and tremors along with low hemoglobin. Pt's wife states he has his hemoglobin checked every 2 weeks. Pt has hx of chronic GI bleeds but the specialist can not determine the causes of the bleed. Pt states he has had dark and tarry stools. Per pt and wife his hemoglobin has dropped from 11 to 9. Pt's wife states that starting today pt has had increased confusion. Pt tried to turn on the tv with a telephone and other similar behavior. Pt has adequate cap refill and strong bilateral radial pulses. Pt has hx of cirrhosis. Pt also started taking a new medication Tuesday for cholesterol and thinks some of the symptoms (tremors) may be related to this.

## 2017-05-11 NOTE — ED Provider Notes (Signed)
[facility] DEPT Provider Note   CSN: [id number] Arrival date & time: 05/11/17  2022     History   Chief Complaint Chief Complaint  Patient presents with  . Altered Mental Status  . Abnormal Lab    HPI William [name] is a 67 y.o. male.  67 yo M with a chief complaints of altered mental status.  Per the wife he has been mildly confused today.  He is also been having some sort of tremor to his hands.  He denies nausea or vomiting denies abdominal pain fevers chest pain cough congestion diarrhea.  The history is provided by the patient.  Altered Mental Status   This is a new problem. The current episode started yesterday. The problem has been gradually worsening. Pertinent negatives include no confusion.  Abnormal Lab  Illness  This is a new problem. The current episode started less than 1 hour ago. The problem occurs constantly. The problem has not changed since onset.Pertinent negatives include no chest pain, no abdominal pain, no headaches and no shortness of breath. Nothing aggravates the symptoms. Nothing relieves the symptoms. He has tried nothing for the symptoms. The treatment provided no relief.    Past Medical History:  Diagnosis Date  . AAA (abdominal aortic aneurysm) ([place]) 03/2014   3.2 cm on MR abd.  F/u aortic u/s 06/2014 showed 3.0 x 3.1 cm AAA: recheck 2 yrs recommended (followed by cardiologist).  Minimal growth on 10/2016 CT abd done for epig pain.  Repeat u/s 10/2018.  [name] Anemia due to chronic blood loss 2018/19   GI: transfusions x >20 required; multiple endoscopies and bleeding scans unrevealing.  Hemolysis ruled out by hematologist.  Marshell Levan and octreotide + monthly iron infusions as of 04/2017.  . Bilateral renal cysts    simple (03/2014 MRI)  . CAD (coronary artery disease)   . Cholelithiases 2018   asymptomatic  . Chronic diastolic heart failure ([place])   . Cirrhosis ([place]) 05/2016   secondary to NASH; most recent u/s abd 07/2016  showed splenomegaly.  Hx of portal HTN changes with mild ascites and splenomegaly.  . Diabetes mellitus with complication ([place]) 09/6576   A1c 6.8%  . History of blood transfusion 2018 X 4 dates   "low blood" (09/21/2016)  . Hyperlipemia, mixed    elevated LFTs when on statins.    . Hypertension    Cr bump 04/01/16 so I changed benicar-hct to benicar plain and added amlodipine 5 mg.  . Iron deficiency anemia 05/2016   Acute blood loss anemia: hospitalized, required transfusion x 3 U: colonoscopy and capsule study unrevealing.  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  W/u for ? hemolytic anemia to be pursued by hematologist as outpt.  Dr. [name], GI at [facility] following, too---he rec'd onc do bone marrow bx as of Jan 2019  . Microscopic hematuria    Eval unremarkable by Dr. Eulogio Ditch.  [name] NASH (nonalcoholic steatohepatitis)    Fatty liver on MR abd 03/2014, + hx of elevated transaminases and bili: followed by Dr. [name].  . Obesity   . Spleen enlarged     Patient Active Problem List   Diagnosis Date Noted  . Anemia 10/14/2016  . Chronic GI bleeding   . Acute blood loss anemia 08/31/2016  . Iron deficiency anemia due to chronic blood loss 08/31/2016  . Acute GI bleeding 08/31/2016  . Cirrhosis, nonalcoholic ([place]) 46/96/2952  .  Portal hypertension ([place]) 08/31/2016  . Splenomegaly 08/31/2016  . Esophageal varices ([place]) 08/31/2016  . Symptomatic anemia 07/30/2016  . Thrombocytopenia ([place]) 07/29/2016  . Weakness 07/28/2016  . Aortic stenosis, mild 07/28/2016  . Chronic diastolic CHF (congestive heart failure) ([place])   . History of GI bleed 05/22/2016  . Pulmonary edema 05/22/2016  . Acute on chronic renal failure ([place]) 05/22/2016  . Non-insulin treated type 2 diabetes mellitus ([place]) 05/22/2016  . Moderate obesity 11/15/2013  . Abdominal aortic aneurysm ([place]) 03/19/2013  . Hematuria 03/19/2013  . Morbid  obesity ([place]) 08/24/2012  . 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 Metabolic syndrome 94/50/3888  . Essential hypertension 08/24/2012  . Mixed hyperlipidemia 08/24/2012    Past Surgical History:  Procedure Laterality Date  . CARDIAC CATHETERIZATION    . CARDIOVASCULAR STRESS TEST  01/17/2012; 05/04/16   Normal stress nuclear study x 2 (2018--Normal perfusion. LVEF 66% with normal wall motion. This is a low risk study).  . CERVICAL DISCECTOMY  1992  . COLONOSCOPY N/A 05/27/2016   No site/explanation for blood loss found.  Erythematous mucosa in cecum and ascending colon--Cecal bx: normal.  Procedure: COLONOSCOPY;  Surgeon: Carol Ada, MD;  Location: [facility] ENDOSCOPY;  Service: Endoscopy;  Laterality: N/A;  . COLONOSCOPY W/ POLYPECTOMY  09/10/2014   Polypectomy x 2: recall 5 yrs (Dr. [name]).  . CORONARY ANGIOPLASTY    . CORONARY ARTERY BYPASS GRAFT  03/2006  . ENTEROSCOPY N/A 07/31/2016   Procedure: ENTEROSCOPY;  Surgeon: Ladene Artist, MD;  Location: [facility] ENDOSCOPY;  Service: Endoscopy;  Laterality: N/A;  . ENTEROSCOPY N/A 12/02/2016   Procedure: ENTEROSCOPY;  Surgeon: Carol Ada, MD;  Location: [facility] ENDOSCOPY;  Service: Endoscopy;  Laterality: N/A;  . ESOPHAGOGASTRODUODENOSCOPY N/A 05/25/2016   No site/explanation for blood loss found.  Procedure: ESOPHAGOGASTRODUODENOSCOPY (EGD);  Surgeon: Juanita Craver, MD;  Location: [facility] ENDOSCOPY;  Service: Endoscopy;  Laterality: N/A;  . ESOPHAGOGASTRODUODENOSCOPY N/A 09/23/2016   Procedure: ESOPHAGOGASTRODUODENOSCOPY (EGD);  Surgeon: Carol Ada, MD;  Location: [facility];  Service: Endoscopy;  Laterality: N/A;  . GIVENS CAPSULE STUDY N/A 05/27/2016   No source identified.  Repeat 10/2016--results pending.  Procedure: GIVENS CAPSULE STUDY;  Surgeon: Carol Ada, MD;  Location: [facility] ENDOSCOPY;  Service: Endoscopy;  Laterality: N/A;  . GIVENS CAPSULE STUDY N/A 10/15/2016   Procedure: GIVENS CAPSULE STUDY;  Surgeon: Carol Ada, MD;  Location: [facility] ENDOSCOPY;  Service:  Endoscopy;  Laterality: N/A;  . GIVENS CAPSULE STUDY N/A 02/13/2017   Procedure: GIVENS CAPSULE STUDY;  Surgeon: Carol Ada, MD;  Location: [facility];  Service: Endoscopy;  Laterality: N/A;  . NECK SURGERY  1991  . NM GI BLOOD LOSS  01/27/2017   NEGATIVE  . SMALL BOWEL ENTEROSCOPY  10/2016   ([facility], Dr. [name]: Double balloon enteroscopy--to the level of proximal ileum) jejunal polyps- which were removed--not bleeding & benign.  No source of bleeding was identified.  . TRANSTHORACIC ECHOCARDIOGRAM  05/25/2016   EF 60%, normal wall motion, grd II DD, mild aortic stenosis, dilated aortic root and ascending aorta.  [name] VASECTOMY  1977        Home Medications    Prior to Admission medications   Medication Sig Start Date End Date Taking? Authorizing Provider  acetaminophen (TYLENOL) 325 MG tablet Take 325 mg by mouth every 6 (six) hours as needed for fever (for fever/pain.).    Yes [provider]  cholecalciferol (VITAMIN D) 1000 units tablet Take 1,000 Units by mouth daily.   Yes [provider]  Evolocumab (  REPATHA SURECLICK) 382 MG/ML SOAJ Inject 140 mg into the skin every 14 (fourteen) days. 03/08/17  Yes Troy Sine, MD  fluticasone (FLONASE) 50 MCG/ACT nasal spray Place 2 sprays into both nostrils daily. 01/01/16  Yes Withrow, Elyse Jarvis, FNP  metoprolol tartrate (LOPRESSOR) 25 MG tablet Take 0.5 tablets (12.5 mg total) by mouth 2 (two) times daily. 06/22/16  Yes McGowen, Adrian Blackwater, MD  Octreotide Acetate (SANDOSTATIN IJ) Inject 1 Dose as directed every 30 (thirty) days.   Yes [provider]  ondansetron (ZOFRAN) 4 MG tablet Take 1 tablet (4 mg total) by mouth every 8 (eight) hours as needed for nausea or vomiting. 04/05/17  Yes McGowen, Adrian Blackwater, MD  pantoprazole (PROTONIX) 40 MG tablet TAKE 1 TABLET BY MOUTH TWICE DAILY BEFORE A MEAL 03/03/17  Yes McGowen, Adrian Blackwater, MD  potassium chloride SA (K-DUR,KLOR-CON) 20 MEQ tablet TAKE 1 TABLET (20 MEQ TOTAL) BY MOUTH  TWICE A DAY 04/14/17  Yes McGowen, Adrian Blackwater, MD  spironolactone (ALDACTONE) 25 MG tablet Take 12.5 mg by mouth daily.   Yes [provider]  torsemide (DEMADEX) 20 MG tablet TAKE 2 TABLETS (40 MG TOTAL) BY MOUTH DAILY. 03/03/17  Yes McGowen, Adrian Blackwater, MD  tranexamic acid (LYSTEDA) 650 MG TABS tablet Take 2 tablets (1,300 mg total) by mouth 3 (three) times daily. 03/11/17  Yes Brunetta Genera, MD  lactulose (CHRONULAC) 10 GM/15ML solution Take 45 mLs (30 g total) by mouth 3 (three) times daily. Take this until you have three BM's a day. 05/11/17   Deno Etienne, DO    Family History Family History  Problem Relation Age of Onset  . Hypertension Mother   . Cancer - Other Mother        liver cancer  . Heart disease Father   . Heart attack Father   . Cancer - Lung Father   . Diabetes Father   . Liver disease Sister   . Anemia Sister   . Heart disease Sister   . Heart attack Sister   . Heart disease Brother        CABG 20 YEARS AGO  . Hypertension Brother   . Mesothelioma Brother        HALF-BROTHER  . Cancer - Other Brother        CLL  . Cancer - Lung Brother        Mets to brain  . Cancer - Lung Sister        HALF-SISTER  . Liver disease Brother   . Heart disease Brother        CABG-2012  . Emphysema Maternal Grandfather   . Cancer - Other Paternal Grandmother        Stomach  . Heart attack Paternal Grandfather     Social History Social History   Tobacco Use  . Smoking status: Former Smoker    Types: Cigarettes  . Smokeless tobacco: Never Used  . Tobacco comment: QUIT I 1983  Substance Use Topics  . Alcohol use: No  . Drug use: No     Allergies   Statins   Review of Systems Review of Systems  Constitutional: Positive for activity change. Negative for chills and fever.  HENT: Negative for congestion and facial swelling.   Eyes: Negative for discharge and visual disturbance.  Respiratory: Negative for shortness of breath.   Cardiovascular: Negative for  chest pain and palpitations.  Gastrointestinal: Negative for abdominal pain, diarrhea and vomiting.  Musculoskeletal: Negative for arthralgias and myalgias.  Skin: Negative for color change and rash.  Neurological: Negative for tremors, syncope and headaches.  Psychiatric/Behavioral: Negative for confusion and dysphoric mood.     Physical Exam Updated Vital Signs BP (!) 165/86 (BP Location: Left Arm)   Pulse (!) 114   Temp 98.1 F (36.7 C) (Oral)   Resp 18   SpO2 95%   Physical Exam  Constitutional: He is oriented to person, place, and time. He appears well-developed and well-nourished.  HENT:  Head: Normocephalic and atraumatic.  Eyes: Pupils are equal, round, and reactive to light. EOM are normal.  Neck: Normal range of motion. Neck supple. No JVD present.  Cardiovascular: Normal rate and regular rhythm. Exam reveals no gallop and no friction rub.  No murmur heard. Pulmonary/Chest: No respiratory distress. He has no wheezes.  Abdominal: He exhibits distension (mild). He exhibits no mass. There is no tenderness. There is no rebound and no guarding.  Musculoskeletal: Normal range of motion.  Neurological: He is alert and oriented to person, place, and time.  Asterixis.   Skin: No rash noted. No pallor.  Psychiatric: He has a normal mood and affect. His behavior is normal.  Nursing note and vitals reviewed.    ED Treatments / Results  Labs (all labs ordered are listed, but only abnormal results are displayed) Labs Reviewed  COMPREHENSIVE METABOLIC PANEL - Abnormal; Notable for the following components:      Result Value   CO2 21 (*)    Glucose, Bld 175 (*)    Creatinine, Ser 1.32 (*)    Calcium 8.5 (*)    Albumin 3.1 (*)    Total Bilirubin 5.1 (*)    GFR calc non Af Amer 54 (*)    All other components within normal limits  CBC - Abnormal; Notable for the following components:   RBC 3.59 (*)    Hemoglobin 10.3 (*)    HCT 32.9 (*)    RDW 21.7 (*)    Platelets 123  (*)    All other components within normal limits  AMMONIA - Abnormal; Notable for the following components:   Ammonia 139 (*)    All other components within normal limits  CBG MONITORING, ED - Abnormal; Notable for the following components:   Glucose-Capillary 154 (*)    All other components within normal limits    EKG None  Radiology No results found.  Procedures Procedures (including critical care time)  Medications Ordered in ED Medications - No data to display   Initial Impression / Assessment and Plan / ED Course  I have reviewed the triage vital signs and the nursing notes.  Pertinent labs & imaging results that were available during my care of the patient were reviewed by me and considered in my medical decision making (see chart for details).     67 yo M with likely hepatic encephalopathy with asterixis on exam.  Ammonia is 140.  He is very mildly confused and able to tolerate by p.o.  We will have him start lactulose.  Follow-up with his GI doctor.  10:54 PM:  I have discussed the diagnosis/risks/treatment options with the patient and family and believe the pt to be eligible for discharge home to follow-up with GI. We also discussed returning to the ED immediately if new or worsening sx occur. We discussed the sx which are most concerning (e.g., sudden worsening pain, fever, inability to tolerate by mouth) that necessitate immediate return. Medications administered to the patient during their visit and any new prescriptions  provided to the patient are listed below.  Medications given during this visit Medications - No data to display   The patient appears reasonably screen and/or stabilized for discharge and I doubt any other medical condition or other [facility] requiring further screening, evaluation, or treatment in the ED at this time prior to discharge.    Final Clinical Impressions(s) / ED Diagnoses   Final diagnoses:  Hepatic encephalopathy [facility])    ED  Discharge Orders        Ordered    lactulose (CHRONULAC) 10 GM/15ML solution  3 times daily     05/11/17 [place], [place], DO 05/11/17 [id number]

## 2017-05-12 ENCOUNTER — Telehealth: Payer: Self-pay | Admitting: Family Medicine

## 2017-05-12 ENCOUNTER — Encounter: Payer: Self-pay | Admitting: Cardiovascular Disease

## 2017-05-12 MED FILL — LACTULOSE Solution 10g/15ml: 10 | 2 days supply | Qty: 240 | Fill #0

## 2017-05-12 NOTE — Telephone Encounter (Signed)
Copied from [place] [id number]. Topic: Quick Communication - See Telephone Encounter >> May 12, 2017  4:39 PM Cleaster Corin, NT wrote: CRM for notification. See Telephone encounter for: 05/12/17.  Pt. Wife calling to speak with  nurse concerning pt. Ed visit [phone]

## 2017-05-15 DIAGNOSIS — D509 Iron deficiency anemia, unspecified: Secondary | ICD-10-CM | POA: Diagnosis not present

## 2017-05-15 DIAGNOSIS — K7460 Unspecified cirrhosis of liver: Secondary | ICD-10-CM | POA: Diagnosis not present

## 2017-05-15 DIAGNOSIS — K7290 Hepatic failure, unspecified without coma: Secondary | ICD-10-CM | POA: Diagnosis not present

## 2017-05-15 MED FILL — LACTULOSE Solution 10g/15ml: 10 | 30 days supply | Qty: 2700 | Fill #0

## 2017-05-15 NOTE — Telephone Encounter (Signed)
Left message for pts wife to call back.

## 2017-05-16 ENCOUNTER — Encounter: Payer: Self-pay | Admitting: Family Medicine

## 2017-05-16 NOTE — Telephone Encounter (Signed)
See MyChart message from pt 05/16/17.

## 2017-05-16 NOTE — Telephone Encounter (Signed)
Noted  

## 2017-05-19 ENCOUNTER — Inpatient Hospital Stay: Payer: 59 | Attending: Hematology

## 2017-05-19 ENCOUNTER — Ambulatory Visit: Payer: 59

## 2017-05-19 DIAGNOSIS — K922 Gastrointestinal hemorrhage, unspecified: Secondary | ICD-10-CM | POA: Diagnosis not present

## 2017-05-19 DIAGNOSIS — D500 Iron deficiency anemia secondary to blood loss (chronic): Secondary | ICD-10-CM | POA: Insufficient documentation

## 2017-05-19 LAB — CBC WITH DIFFERENTIAL/PLATELET
Basophils Absolute: 0.1 10*3/uL (ref 0.0–0.1)
Basophils Relative: 1 %
EOS PCT: 10 %
Eosinophils Absolute: 0.6 10*3/uL — ABNORMAL HIGH (ref 0.0–0.5)
HCT: 31.7 % — ABNORMAL LOW (ref 38.4–49.9)
Hemoglobin: 10.2 g/dL — ABNORMAL LOW (ref 13.0–17.1)
LYMPHS ABS: 1 10*3/uL (ref 0.9–3.3)
Lymphocytes Relative: 16 %
MCH: 29.6 pg (ref 27.2–33.4)
MCHC: 32.2 g/dL (ref 32.0–36.0)
MCV: 91.9 fL (ref 79.3–98.0)
MONO ABS: 1.2 10*3/uL — AB (ref 0.1–0.9)
MONOS PCT: 18 %
Neutro Abs: 3.6 10*3/uL (ref 1.5–6.5)
Neutrophils Relative %: 55 %
PLATELETS: 110 10*3/uL — AB (ref 140–400)
RBC: 3.45 MIL/uL — AB (ref 4.20–5.82)
RDW: 21 % — AB (ref 11.0–14.6)
WBC: 6.5 10*3/uL (ref 4.0–10.3)

## 2017-05-19 LAB — IRON AND TIBC
IRON: 32 ug/dL — AB (ref 42–163)
SATURATION RATIOS: 10 % — AB (ref 42–163)
TIBC: 313 ug/dL (ref 202–409)
UIBC: 281 ug/dL

## 2017-05-19 LAB — RETICULOCYTES
RBC.: 3.45 MIL/uL — ABNORMAL LOW (ref 4.20–5.82)
Retic Count, Absolute: 134.6 10*3/uL — ABNORMAL HIGH (ref 34.8–93.9)
Retic Ct Pct: 3.9 % — ABNORMAL HIGH (ref 0.8–1.8)

## 2017-05-19 LAB — FERRITIN: Ferritin: 175 ng/mL (ref 22–316)

## 2017-05-19 MED FILL — METOPROLOL TARTRATE 25 MG T: 25 | 60 days supply | Qty: 60 | Fill #5

## 2017-05-26 ENCOUNTER — Other Ambulatory Visit: Payer: 59

## 2017-06-01 MED FILL — POTASSIUM CL ER 20 MEQ TABL: 20 | 30 days supply | Qty: 60 | Fill #1

## 2017-06-02 ENCOUNTER — Encounter: Payer: Self-pay | Admitting: *Deleted

## 2017-06-02 ENCOUNTER — Telehealth: Payer: Self-pay | Admitting: Adult Health

## 2017-06-02 ENCOUNTER — Inpatient Hospital Stay: Payer: 59

## 2017-06-02 ENCOUNTER — Encounter: Payer: Self-pay | Admitting: Adult Health

## 2017-06-02 ENCOUNTER — Inpatient Hospital Stay (HOSPITAL_BASED_OUTPATIENT_CLINIC_OR_DEPARTMENT_OTHER): Payer: 59 | Admitting: Adult Health

## 2017-06-02 VITALS — BP 98/54 | HR 55 | Temp 97.9°F | Resp 16

## 2017-06-02 VITALS — BP 106/59 | HR 59 | Temp 97.9°F | Resp 17 | Ht 69.0 in | Wt 233.1 lb

## 2017-06-02 DIAGNOSIS — K922 Gastrointestinal hemorrhage, unspecified: Secondary | ICD-10-CM | POA: Diagnosis not present

## 2017-06-02 DIAGNOSIS — D500 Iron deficiency anemia secondary to blood loss (chronic): Secondary | ICD-10-CM | POA: Diagnosis not present

## 2017-06-02 LAB — CBC WITH DIFFERENTIAL (CANCER CENTER ONLY)
BASOS ABS: 0.1 10*3/uL (ref 0.0–0.1)
Basophils Relative: 2 %
Eosinophils Absolute: 0.6 10*3/uL — ABNORMAL HIGH (ref 0.0–0.5)
Eosinophils Relative: 11 %
HEMATOCRIT: 30.7 % — AB (ref 38.4–49.9)
HEMOGLOBIN: 9.6 g/dL — AB (ref 13.0–17.1)
LYMPHS PCT: 18 %
Lymphs Abs: 0.9 10*3/uL (ref 0.9–3.3)
MCH: 27.7 pg (ref 27.2–33.4)
MCHC: 31.3 g/dL — ABNORMAL LOW (ref 32.0–36.0)
MCV: 88.7 fL (ref 79.3–98.0)
Monocytes Absolute: 0.6 10*3/uL (ref 0.1–0.9)
Monocytes Relative: 12 %
NEUTROS ABS: 3 10*3/uL (ref 1.5–6.5)
NEUTROS PCT: 57 %
Platelet Count: 109 10*3/uL — ABNORMAL LOW (ref 140–400)
RBC: 3.46 MIL/uL — AB (ref 4.20–5.82)
RDW: 18.1 % — ABNORMAL HIGH (ref 11.0–14.6)
WBC: 5.2 10*3/uL (ref 4.0–10.3)

## 2017-06-02 LAB — CMP (CANCER CENTER ONLY)
ALT: 22 U/L (ref 0–55)
ANION GAP: 8 (ref 3–11)
AST: 28 U/L (ref 5–34)
Albumin: 3.1 g/dL — ABNORMAL LOW (ref 3.5–5.0)
Alkaline Phosphatase: 96 U/L (ref 40–150)
BILIRUBIN TOTAL: 4 mg/dL — AB (ref 0.2–1.2)
BUN: 17 mg/dL (ref 7–26)
CO2: 22 mmol/L (ref 22–29)
Calcium: 8.8 mg/dL (ref 8.4–10.4)
Chloride: 106 mmol/L (ref 98–109)
Creatinine: 1.56 mg/dL — ABNORMAL HIGH (ref 0.70–1.30)
GFR, EST NON AFRICAN AMERICAN: 44 mL/min — AB (ref >60)
GFR, Est AFR Am: 51 mL/min — ABNORMAL LOW (ref >60)
Glucose, Bld: 109 mg/dL (ref 70–140)
Potassium: 4.2 mmol/L (ref 3.5–5.1)
Sodium: 136 mmol/L (ref 136–145)
TOTAL PROTEIN: 6.5 g/dL (ref 6.4–8.3)

## 2017-06-02 MED ORDER — SODIUM CHLORIDE 0.9 % IV SOLN
750.0000 mg | Freq: Once | INTRAVENOUS | Status: AC
  Administered 2017-06-02: 750 mg via INTRAVENOUS
  Filled 2017-06-02: qty 15

## 2017-06-02 MED ORDER — OCTREOTIDE ACETATE 30 MG IM KIT
PACK | INTRAMUSCULAR | Status: AC
  Filled 2017-06-02: qty 1

## 2017-06-02 MED ORDER — OCTREOTIDE ACETATE 30 MG IM KIT
30.0000 mg | PACK | Freq: Once | INTRAMUSCULAR | Status: AC
  Administered 2017-06-02: 30 mg via INTRAMUSCULAR

## 2017-06-02 NOTE — Assessment & Plan Note (Signed)
William [name] is a 67 year old male who is here today for evaluation prior to receiving his IV iron and Sandostatin for his chronic blood loss.  He is doing well today.  His hemoglobin was 9.6 today, slightly lower than last week.  He is tolerating the treatment well.  He will continue on Lysteda, Iron infusions and Sandostatin every 4 weeks.  He will return in two weeks for labs, and in 4 weeks for labs, f/u with Dr. Irene Limbo and Christeen Douglas and Sandostatin.

## 2017-06-02 NOTE — Progress Notes (Signed)
[facility] Follow up:    Tammi Sou, MD [address] [place] [address]   DIAGNOSIS: Cancer Staging No matching staging information was found for the patient.  SUMMARY OF ONCOLOGIC HISTORY: Per Dr. Irene Limbo note from 05/05/2017 Patient has had recurrent GI bleeding requiring > 20 units of PRBCs. Between April 2018 and Aug 2018 and continues to have ongoing GI bleeding.  Previous workup showed LDH within normal limits at 220 and suggests against overt hemolysis. Sedimentation rate within normal limits. Coombs' test is negative. Myeloma panel and serum free light chains suggest no monoclonal paraproteinemia. PNH testing negative Slightly lower haptoglobin levels can be related to decreased hepatic production, low-level hemolysis due to transfusions or extravascular hemolysis due to splenic or hepatic sequestration.  small bowel endoscopy done on 12/02/2016: impression - The examined portion of the jejunum was normal.  - Normal examined duodenum. - Portal hypertensive gastropathy.  -Small (< 5 mm) esophageal varices. - No specimens collected.  Capsule Endoscopy on 02/15/17: No evidence of any bleeding during this examination. There was the possibility of an atypical AVM manifested as an erythematous patch. No evidence of any ulcerations, erosions, masses, or polyps.  Recent rbc tagged study unrevealing. Recent labs on 05/05/2017 showed: Retic Ct Pct at 3.2, RBC at 3.34, and Hgb at 9.4.     CURRENT THERAPY: On Lysteda 1336m po three times per day, LA Sandostatin every 4 weeks, IV iron given monthly.  He has labs every 2 weeks.    INTERVAL HISTORY: William GAMBALE89y.o. male returns for evaluation prior to receiving IV iron and Sandostatin.  His hemoglobin is stable.  He remains fatigued.  [place] due to a reaction to his cholesterol medication, Repatha that caused a liver enzyme and ammonia  level elevation.  He is taking Lactulose and is tapering down off of it.  He noted dark stools about 4 days ago, but nothing otherwise.     Patient Active Problem List   Diagnosis Date Noted  . Anemia 10/14/2016  . Chronic GI bleeding   . Acute blood loss anemia 08/31/2016  . Iron deficiency anemia due to chronic blood loss 08/31/2016  . Acute GI bleeding 08/31/2016  . Cirrhosis, nonalcoholic ([facility] 082/64/1583 . Portal hypertension ([facility] 08/31/2016  . Splenomegaly 08/31/2016  . Esophageal varices ([place] 08/31/2016  . Symptomatic anemia 07/30/2016  . Thrombocytopenia ([facility] 07/29/2016  . Weakness 07/28/2016  . Aortic stenosis, mild 07/28/2016  . Chronic diastolic CHF (congestive heart failure) ([facility]   . History of GI bleed 05/22/2016  . Pulmonary edema 05/22/2016  . Acute on chronic renal failure ([facility] 05/22/2016  . Non-insulin treated type 2 diabetes mellitus ([facility] 05/22/2016  . Moderate obesity 11/15/2013  . Abdominal aortic aneurysm ([facility] 03/19/2013  . Hematuria 03/19/2013  . Morbid obesity ([facility] 08/24/2012  . 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 Metabolic syndrome 009/40/7680 . Essential hypertension 08/24/2012  . Mixed hyperlipidemia 08/24/2012    is allergic to statins.  MEDICAL HISTORY: Past Medical History:  Diagnosis Date  . AAA (abdominal aortic aneurysm) ([facility] 03/2014   3.2 cm on MR abd.  F/u aortic u/s 06/2014 showed 3.0 x 3.1 cm AAA: recheck 2 yrs recommended (followed by cardiologist).  Minimal growth on 10/2016 CT abd done for epig pain.  Repeat u/s 10/2018.  .William KitchenAnemia due to chronic blood loss 2018/19   GI: transfusions x >20 required;  multiple endoscopies and bleeding scans unrevealing.  Hemolysis ruled out by hematologist.  Marshell Levan and octreotide + monthly iron infusions as of 04/2017.  . Bilateral renal cysts    simple (03/2014 MRI)  . CAD (coronary artery disease)   . Cholelithiases 2018   asymptomatic  . Chronic diastolic heart failure ([place])   . Cirrhosis ([place]) 05/2016    secondary to NASH; most recent u/s abd 07/2016 showed splenomegaly.  Hx of portal HTN changes with mild ascites and splenomegaly.  . Diabetes mellitus with complication ([place]) 81/1572   A1c 6.8%  . History of blood transfusion 2018 X 4 dates   "low blood" (09/21/2016)  . Hyperlipemia, mixed    elevated LFTs when on statins.    . Hypertension    Cr bump 04/01/16 so I changed benicar-hct to benicar plain and added amlodipine 5 mg.  . Iron deficiency anemia 05/2016   Acute blood loss anemia: hospitalized, required transfusion x 3 U: colonoscopy and capsule study unrevealing.  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  W/u for ? hemolytic anemia to be pursued by hematologist as outpt.  Dr. [name], GI at [facility] following, too---he rec'd onc do bone marrow bx as of Jan 2019  . Microscopic hematuria    Eval unremarkable by Dr. Eulogio Ditch.  William [name] NASH (nonalcoholic steatohepatitis)    Fatty liver on MR abd 03/2014, + hx of elevated transaminases and bili: followed by Dr. [name].  . Obesity   . Spleen enlarged     SURGICAL HISTORY: Past Surgical History:  Procedure Laterality Date  . CARDIAC CATHETERIZATION    . CARDIOVASCULAR STRESS TEST  01/17/2012; 05/04/16   Normal stress nuclear study x 2 (2018--Normal perfusion. LVEF 66% with normal wall motion. This is a low risk study).  . CERVICAL DISCECTOMY  1992  . COLONOSCOPY N/A 05/27/2016   No site/explanation for blood loss found.  Erythematous mucosa in cecum and ascending colon--Cecal bx: normal.  Procedure: COLONOSCOPY;  Surgeon: Carol Ada, MD;  Location: [facility] ENDOSCOPY;  Service: Endoscopy;  Laterality: N/A;  . COLONOSCOPY W/ POLYPECTOMY  09/10/2014   Polypectomy x 2: recall 5 yrs (Dr. [name]).  . CORONARY ANGIOPLASTY    . CORONARY ARTERY BYPASS GRAFT  03/2006  . ENTEROSCOPY N/A 07/31/2016   Procedure: ENTEROSCOPY;  Surgeon: Ladene Artist, MD;  Location: [facility]  ENDOSCOPY;  Service: Endoscopy;  Laterality: N/A;  . ENTEROSCOPY N/A 12/02/2016   Procedure: ENTEROSCOPY;  Surgeon: Carol Ada, MD;  Location: [facility] ENDOSCOPY;  Service: Endoscopy;  Laterality: N/A;  . ESOPHAGOGASTRODUODENOSCOPY N/A 05/25/2016   No site/explanation for blood loss found.  Procedure: ESOPHAGOGASTRODUODENOSCOPY (EGD);  Surgeon: Juanita Craver, MD;  Location: [facility] ENDOSCOPY;  Service: Endoscopy;  Laterality: N/A;  . ESOPHAGOGASTRODUODENOSCOPY N/A 09/23/2016   Procedure: ESOPHAGOGASTRODUODENOSCOPY (EGD);  Surgeon: Carol Ada, MD;  Location: [place];  Service: Endoscopy;  Laterality: N/A;  . GIVENS CAPSULE STUDY N/A 05/27/2016   No source identified.  Repeat 10/2016--results pending.  Procedure: GIVENS CAPSULE STUDY;  Surgeon: Carol Ada, MD;  Location[facility] ENDOSCOPY;  Service: Endoscopy;  Laterality: N/A;  . GIVENS CAPSULE STUDY N/A 10/15/2016   Procedure: GIVENS CAPSULE STUDY;  Surgeon: Carol Ada, MD;  Location: [facility] ENDOSCOPY;  Service: Endoscopy;  Laterality: N/A;  . GIVENS CAPSULE STUDY N/A 02/13/2017   Procedure: GIVENS CAPSULE STUDY;  Surgeon: Carol Ada, MD;  Location: [facility];  Service: Endoscopy;  Laterality: N/A;  . NECK SURGERY  1991  .  NM GI BLOOD LOSS  01/27/2017   NEGATIVE  . SMALL BOWEL ENTEROSCOPY  10/2016   ([facility], Dr. [name]: Double balloon enteroscopy--to the level of proximal ileum) jejunal polyps- which were removed--not bleeding & benign.  No source of bleeding was identified.  . TRANSTHORACIC ECHOCARDIOGRAM  05/25/2016   EF 60%, normal wall motion, grd II DD, mild aortic stenosis, dilated aortic root and ascending aorta.  William [name] VASECTOMY  1977    SOCIAL HISTORY: Social History   Socioeconomic History  . Marital status: Married    Spouse name: Not on file  . Number of children: Not on file  . Years of education: Not on file  . Highest education level: Not on file  Occupational History  . Not on file  Social Needs  . Financial resource strain: Not on  file  . Food insecurity:    Worry: Not on file    Inability: Not on file  . Transportation needs:    Medical: Not on file    Non-medical: Not on file  Tobacco Use  . Smoking status: Former Smoker    Types: Cigarettes  . Smokeless tobacco: Never Used  . Tobacco comment: QUIT I 1983  Substance and Sexual Activity  . Alcohol use: No  . Drug use: No  . Sexual activity: Yes  Lifestyle  . Physical activity:    Days per week: 0 days    Minutes per session: 0 min  . Stress: Not on file  Relationships  . Social connections:    Talks on phone: Not on file    Gets together: Not on file    Attends religious service: Not on file    Active member of club or organization: Not on file    Attends meetings of clubs or organizations: Not on file    Relationship status: Not on file  . Intimate partner violence:    Fear of current or ex partner: Not on file    Emotionally abused: Not on file    Physically abused: Not on file    Forced sexual activity: Not on file  Other Topics Concern  . Not on file  Social History Narrative   Married, 3 grown children, 3 GCs.   Educ: 10th grade.   Occupation: Retired Brewing technologist.   Tob: quit 1983, smoked about 20 pack-yr hx prior.   No alcohol.    FAMILY HISTORY: Family History  Problem Relation Age of Onset  . Hypertension Mother   . Cancer - Other Mother        liver cancer  . Heart disease Father   . Heart attack Father   . Cancer - Lung Father   . Diabetes Father   . Liver disease Sister   . Anemia Sister   . Heart disease Sister   . Heart attack Sister   . Heart disease Brother        CABG 20 YEARS AGO  . Hypertension Brother   . Mesothelioma Brother        HALF-BROTHER  . Cancer - Other Brother        CLL  . Cancer - Lung Brother        Mets to brain  . Cancer - Lung Sister        HALF-SISTER  . Liver disease Brother   . Heart disease Brother        CABG-2012  . Emphysema Maternal Grandfather   . Cancer - Other Paternal  Grandmother  Stomach  . Heart attack Paternal Grandfather     Review of Systems  Constitutional: Positive for fatigue. Negative for appetite change, chills, fever and unexpected weight change.  HENT:   Negative for hearing loss, lump/mass, sore throat and trouble swallowing.   Eyes: Negative for eye problems and icterus.  Respiratory: Negative for chest tightness, cough and shortness of breath.   Cardiovascular: Negative for chest pain, leg swelling and palpitations.  Gastrointestinal: Negative for abdominal distention, abdominal pain, constipation, diarrhea, nausea and vomiting.  Endocrine: Negative for hot flashes.  Skin: Negative for itching and rash.  Neurological: Negative for dizziness.  Hematological: Negative for adenopathy. Does not bruise/bleed easily.  Psychiatric/Behavioral: Negative for depression. The patient is not nervous/anxious.       PHYSICAL EXAMINATION  ECOG PERFORMANCE STATUS: 1 - Symptomatic but completely ambulatory  Vitals:   06/02/17 0859  BP: (!) 106/59  Pulse: (!) 59  Resp: 17  Temp: 97.9 F (36.6 C)  SpO2: 93%    Physical Exam  Constitutional: He is oriented to person, place, and time and well-developed, well-nourished, and in no distress.  HENT:  Head: Normocephalic and atraumatic.  Mouth/Throat: Oropharynx is clear and moist. No oropharyngeal exudate.  Eyes: Pupils are equal, round, and reactive to light. No scleral icterus.  Neck: Neck supple.  Cardiovascular: Normal rate, regular rhythm, normal heart sounds and intact distal pulses.  Pulmonary/Chest: Effort normal and breath sounds normal. No respiratory distress. He has no wheezes. He has no rales. He exhibits no tenderness.  Abdominal: Soft. Bowel sounds are normal. He exhibits no distension and no mass. There is no tenderness. There is no rebound and no guarding.  Lymphadenopathy:    He has no cervical adenopathy.  Neurological: He is alert and oriented to person, place, and  time.  Skin: Skin is warm and dry. No rash noted.  Psychiatric: Mood and affect normal.    LABORATORY DATA:  CBC    Component Value Date/Time   WBC 5.2 06/02/2017 0834   WBC 6.5 05/19/2017 0959   RBC 3.46 (L) 06/02/2017 0834   HGB 9.6 (L) 06/02/2017 0834   HGB 8.7 (L) 02/10/2017 0948   HCT 30.7 (L) 06/02/2017 0834   HCT 27.2 (L) 02/10/2017 0948   PLT 109 (L) 06/02/2017 0834   PLT 103 (L) 02/10/2017 0948   PLT 125 (L) 07/28/2016 1416   MCV 88.7 06/02/2017 0834   MCV 82.1 02/10/2017 0948   MCH 27.7 06/02/2017 0834   MCHC 31.3 (L) 06/02/2017 0834   RDW 18.1 (H) 06/02/2017 0834   RDW 19.0 (H) 02/10/2017 0948   LYMPHSABS 0.9 06/02/2017 0834   LYMPHSABS 1.1 02/10/2017 0948   MONOABS 0.6 06/02/2017 0834   MONOABS 1.2 (H) 02/10/2017 0948   EOSABS 0.6 (H) 06/02/2017 0834   EOSABS 0.7 (H) 02/10/2017 0948   BASOSABS 0.1 06/02/2017 0834   BASOSABS 0.1 02/10/2017 0948    CMP     Component Value Date/Time   NA 138 05/11/2017 2102   NA 139 02/10/2017 0948   K 3.8 05/11/2017 2102   K 3.9 02/10/2017 0948   CL 109 05/11/2017 2102   CO2 21 (L) 05/11/2017 2102   CO2 24 02/10/2017 0948   GLUCOSE 175 (H) 05/11/2017 2102   GLUCOSE 115 02/10/2017 0948   BUN 12 05/11/2017 2102   BUN 10.4 02/10/2017 0948   CREATININE 1.32 (H) 05/11/2017 2102   CREATININE 1.15 05/05/2017 0833   CREATININE 1.3 02/10/2017 0948   CALCIUM 8.5 (L) 05/11/2017 2102  CALCIUM 8.9 02/10/2017 0948   PROT 6.8 05/11/2017 2102   PROT 6.0 (L) 02/10/2017 0948   ALBUMIN 3.1 (L) 05/11/2017 2102   ALBUMIN 2.9 (L) 02/10/2017 0948   AST 38 05/11/2017 2102   AST 30 05/05/2017 0833   AST 30 02/10/2017 0948   ALT 27 05/11/2017 2102   ALT 25 05/05/2017 0833   ALT 23 02/10/2017 0948   ALKPHOS 112 05/11/2017 2102   ALKPHOS 108 02/10/2017 0948   BILITOT 5.1 (H) 05/11/2017 2102   BILITOT 3.6 (HH) 05/05/2017 0833   BILITOT 4.97 (HH) 02/10/2017 0948   GFRNONAA 54 (L) 05/11/2017 2102   GFRNONAA >60 05/05/2017 0833    GFRAA >60 05/11/2017 2102   GFRAA >60 05/05/2017 3570          ASSESSMENT and THERAPY PLAN:   Iron deficiency anemia due to chronic blood loss William [name] is a 67 year old male who is here today for evaluation prior to receiving his IV iron and Sandostatin for his chronic blood loss.  He is doing well today.  His hemoglobin was 9.6 today, slightly lower than last week.  He is tolerating the treatment well.  He will continue on Lysteda, Iron infusions and Sandostatin every 4 weeks.  He will return in two weeks for labs, and in 4 weeks for labs, f/u with Dr. Irene Limbo and Christeen Douglas and Sandostatin.    All questions were answered. The patient knows to call the clinic with any problems, questions or concerns. We can certainly see the patient much sooner if necessary.  A total of (20) minutes of face-to-face time was spent with this patient with greater than 50% of that time in counseling and care-coordination.  This note was electronically signed. Scot Dock, NP 06/02/2017

## 2017-06-02 NOTE — Patient Instructions (Signed)
Ferric carboxymaltose injection What is this medicine? FERRIC CARBOXYMALTOSE (ferr-ik car-box-ee-mol-toes) is an iron complex. Iron is used to make healthy red blood cells, which carry oxygen and nutrients throughout the body. This medicine is used to treat anemia in people with chronic kidney disease or people who cannot take iron by mouth. This medicine may be used for other purposes; ask your health care provider or pharmacist if you have questions. COMMON BRAND NAME(S): Injectafer What should I tell my health care provider before I take this medicine? They need to know if you have any of these conditions: -anemia not caused by low iron levels -high levels of iron in the blood -liver disease -an unusual or allergic reaction to iron, other medicines, foods, dyes, or preservatives -pregnant or trying to get pregnant -breast-feeding How should I use this medicine? This medicine is for infusion into a vein. It is given by a health care professional in a hospital or clinic setting. Talk to your pediatrician regarding the use of this medicine in children. Special care may be needed. Overdosage: If you think you have taken too much of this medicine contact a poison control center or emergency room at once. NOTE: This medicine is only for you. Do not share this medicine with others. What if I miss a dose? It is important not to miss your dose. Call your doctor or health care professional if you are unable to keep an appointment. What may interact with this medicine? Do not take this medicine with any of the following medications: -deferoxamine -dimercaprol -other iron products This medicine may also interact with the following medications: -chloramphenicol -deferasirox This list may not describe all possible interactions. Give your health care provider a list of all the medicines, herbs, non-prescription drugs, or dietary supplements you use. Also tell them if you smoke, drink alcohol, or use  illegal drugs. Some items may interact with your medicine. What should I watch for while using this medicine? Visit your doctor or health care professional regularly. Tell your doctor if your symptoms do not start to get better or if they get worse. You may need blood work done while you are taking this medicine. You may need to follow a special diet. Talk to your doctor. Foods that contain iron include: whole grains/cereals, dried fruits, beans, or peas, leafy green vegetables, and organ meats (liver, kidney). What side effects may I notice from receiving this medicine? Side effects that you should report to your doctor or health care professional as soon as possible: -allergic reactions like skin rash, itching or hives, swelling of the face, lips, or tongue -breathing problems -changes in blood pressure -feeling faint or lightheaded, falls -flushing, sweating, or hot feelings Side effects that usually do not require medical attention (report to your doctor or health care professional if they continue or are bothersome): -changes in taste -constipation -dizziness -headache -nausea -pain, redness, or irritation at site where injected -vomiting This list may not describe all possible side effects. Call your doctor for medical advice about side effects. You may report side effects to FDA at 1-800-FDA-1088. Where should I keep my medicine? This drug is given in a hospital or clinic and will not be stored at home. NOTE: This sheet is a summary. It may not cover all possible information. If you have questions about this medicine, talk to your doctor, pharmacist, or health care provider.  2018 Elsevier/Gold Standard (2015-02-26 11:20:47)  

## 2017-06-02 NOTE — Telephone Encounter (Signed)
No LOS 4/26

## 2017-06-09 ENCOUNTER — Other Ambulatory Visit: Payer: 59

## 2017-06-12 DIAGNOSIS — R0602 Shortness of breath: Secondary | ICD-10-CM | POA: Diagnosis not present

## 2017-06-12 DIAGNOSIS — K7290 Hepatic failure, unspecified without coma: Secondary | ICD-10-CM | POA: Diagnosis not present

## 2017-06-12 DIAGNOSIS — K7460 Unspecified cirrhosis of liver: Secondary | ICD-10-CM | POA: Diagnosis not present

## 2017-06-12 MED FILL — PANTOPRAZOLE SOD DR 40 MG T: 40 | 30 days supply | Qty: 60 | Fill #1

## 2017-06-12 MED FILL — TORSEMIDE 20 MG TABS: 20 | 30 days supply | Qty: 60 | Fill #1

## 2017-06-15 MED FILL — GENERLAC 10 GM/15 ML SOLN: 10 | 30 days supply | Qty: 2700 | Fill #1

## 2017-06-16 ENCOUNTER — Ambulatory Visit: Payer: 59 | Admitting: Hematology

## 2017-06-16 ENCOUNTER — Ambulatory Visit: Payer: 59

## 2017-06-16 ENCOUNTER — Inpatient Hospital Stay: Payer: 59 | Attending: Hematology

## 2017-06-16 DIAGNOSIS — D62 Acute posthemorrhagic anemia: Secondary | ICD-10-CM | POA: Insufficient documentation

## 2017-06-16 DIAGNOSIS — K922 Gastrointestinal hemorrhage, unspecified: Secondary | ICD-10-CM | POA: Insufficient documentation

## 2017-06-16 DIAGNOSIS — D500 Iron deficiency anemia secondary to blood loss (chronic): Secondary | ICD-10-CM | POA: Diagnosis not present

## 2017-06-16 DIAGNOSIS — E119 Type 2 diabetes mellitus without complications: Secondary | ICD-10-CM | POA: Insufficient documentation

## 2017-06-16 LAB — CBC WITH DIFFERENTIAL (CANCER CENTER ONLY)
Basophils Absolute: 0.1 10*3/uL (ref 0.0–0.1)
Basophils Relative: 2 %
EOS ABS: 0.6 10*3/uL — AB (ref 0.0–0.5)
EOS PCT: 11 %
HCT: 31.1 % — ABNORMAL LOW (ref 38.4–49.9)
Hemoglobin: 9.9 g/dL — ABNORMAL LOW (ref 13.0–17.1)
LYMPHS ABS: 0.7 10*3/uL — AB (ref 0.9–3.3)
Lymphocytes Relative: 14 %
MCH: 29.4 pg (ref 27.2–33.4)
MCHC: 31.8 g/dL — ABNORMAL LOW (ref 32.0–36.0)
MCV: 92.3 fL (ref 79.3–98.0)
Monocytes Absolute: 0.9 10*3/uL (ref 0.1–0.9)
Monocytes Relative: 17 %
Neutro Abs: 2.9 10*3/uL (ref 1.5–6.5)
Neutrophils Relative %: 56 %
Platelet Count: 95 10*3/uL — ABNORMAL LOW (ref 140–400)
RBC: 3.37 MIL/uL — AB (ref 4.20–5.82)
RDW: 20.2 % — ABNORMAL HIGH (ref 11.0–14.6)
WBC: 5.2 10*3/uL (ref 4.0–10.3)

## 2017-06-16 LAB — RETICULOCYTES
RBC.: 3.37 MIL/uL — ABNORMAL LOW (ref 4.20–5.82)
RETIC CT PCT: 4.1 % — AB (ref 0.8–1.8)
Retic Count, Absolute: 138.2 10*3/uL — ABNORMAL HIGH (ref 34.8–93.9)

## 2017-06-16 LAB — CMP (CANCER CENTER ONLY)
ALT: 26 U/L (ref 0–55)
AST: 33 U/L (ref 5–34)
Albumin: 3 g/dL — ABNORMAL LOW (ref 3.5–5.0)
Alkaline Phosphatase: 101 U/L (ref 40–150)
Anion gap: 5 (ref 3–11)
BILIRUBIN TOTAL: 4.4 mg/dL — AB (ref 0.2–1.2)
BUN: 12 mg/dL (ref 7–26)
CO2: 24 mmol/L (ref 22–29)
CREATININE: 1.31 mg/dL — AB (ref 0.70–1.30)
Calcium: 8.6 mg/dL (ref 8.4–10.4)
Chloride: 107 mmol/L (ref 98–109)
GFR, EST NON AFRICAN AMERICAN: 55 mL/min — AB (ref >60)
GFR, Est AFR Am: >60 mL/min (ref >60)
Glucose, Bld: 108 mg/dL (ref 70–140)
POTASSIUM: 4.4 mmol/L (ref 3.5–5.1)
Sodium: 136 mmol/L (ref 136–145)
TOTAL PROTEIN: 6.1 g/dL — AB (ref 6.4–8.3)

## 2017-06-16 LAB — IRON AND TIBC
IRON: 32 ug/dL — AB (ref 42–163)
Saturation Ratios: 10 % — ABNORMAL LOW (ref 42–163)
TIBC: 333 ug/dL (ref 202–409)
UIBC: 300 ug/dL

## 2017-06-16 LAB — FERRITIN: Ferritin: 180 ng/mL (ref 22–316)

## 2017-06-23 ENCOUNTER — Other Ambulatory Visit: Payer: 59

## 2017-06-26 ENCOUNTER — Other Ambulatory Visit: Payer: Self-pay | Admitting: Hematology

## 2017-06-27 ENCOUNTER — Encounter: Payer: Self-pay | Admitting: Hematology

## 2017-06-27 MED FILL — TRANEXAMIC ACID 650 MG TAB: 650 | 30 days supply | Qty: 180 | Fill #0

## 2017-06-29 NOTE — Progress Notes (Signed)
HEMATOLOGY/ONCOLOGY CLINIC NOTE  Date of Service: 06/30/17  Patient Care Team: Tammi Sou, MD as PCP - General (Family Medicine) Juanita Craver, MD as Consulting Physician (Gastroenterology) Franchot Gallo, MD as Consulting Physician (Urology) Troy Sine, MD as Consulting Physician (Cardiology) Brunetta Genera, MD as Consulting Physician (Hematology) [name], Gaspar Skeeters, MD as Consulting Physician (Gastroenterology)  CHIEF COMPLAINTS/PURPOSE OF CONSULTATION:   F/u for anemia and ongoing GI bleeding.  HISTORY OF PRESENTING ILLNESS:   William [name] is a wonderful 67 y.o. male who has been referred to [place] by Dr .Anitra Lauth, Adrian Blackwater, MD for evaluation and management of Anemia.  Patient has a history of extensive medical comorbidities including liver cirrhosis related to [facility] with portal hypertension, esophageal varices and portal hypertensive gastropathy and splenomegaly, iron deficiency anemia, obesity, diabetes, AAA.  Patient was admitted in April 2018 with acute blood loss anemia and required transfusion of multiple units of PRBCs with iron profile suggestive of iron deficiency. No overt evidence of hemolysis noted. He had an EGD and colonoscopy that did not show any overt bleeding. Capsule endoscopy was unrevealing but the capsule past and only 27 minutes.  He follows with Dr. [name] who is his gastric oncologist.  Patient was again readmitted in June 2018 with symptomatically anemia and hemoglobin of 6.3. He underwent 4 units of PRBC transfusions again and was sent out on PPI and iron supplementation with the plan to follow-up with Dr. [name].  He was also given hematology referral. He had a repeat ultrasound of the abdomen on 07/29/2016 which showed significant increase in splenomegaly in 2 months suggesting significant portal hypertension versus some element of splenic sequestration.  He continues to note intermittent black stools.  Hemoglobin is stable and  improved today and is up to 11.1. Patient notes he feels better after his transfusions. Has not noted lower GI bleeding at this time. We discuss any other workup to rule out less likely other possibilities of his anemia.  INTERVAL HISTORY  Aarsh is here for a scheduled follow-up of his anemia that is primarily related to ongoing issues with GI bleeding and has not been controllable by multiple GI interventions. The patient's last visit with [place] was on 05/05/17. He is accompanied today by his wife. The pt reports that he is doing well overall. His last blood transfusion was 03/11/17.   The pt reports that he has had a little fatigue and some mild breathing difficulty. He denies any problems taking Sandostatin and denies black stools, reporting that his bowel movements are as normal as they've ever been and has 2-3 bowel movements a day.  He presented to the ED on 05/11/17 for hepatic encephalopathy with high ammonia. He notes that he continues follow up with Dr [name] for his liver, and he takes 60m Lactulose and was stopped on Repatha. He will see Dr HBenson Norwayagain in 3 months.   Lab results today (06/30/17) of CBC, and Reticulocytes is as follows: all values are WNL except for RBC at 3.09, Hgb at 8.5, HCT at 27.3, MCHC at 31.1, RDW at 18.3, Platelets at 97k, Eosinophils Abs at 0.8, Retic ct pct at 4.0%, Retic ct abs at 123.6. Ferritin 06/30/17 is 72  On review of systems, pt reports intermittent fatigue, normal bowel movements, SOB on exertion, minimal ankle swelling, and denies black stools, abdominal pains, and any other symptoms.    MEDICAL HISTORY:  Past Medical History:  Diagnosis Date  . [place]  3.2 cm on MR abd.  F/u aortic u/s 06/2014 showed 3.0 x 3.1 cm AAA: recheck 2 yrs recommended (followed by cardiologist).  Minimal growth on 10/2016 CT abd done for epig pain.  Repeat u/s 10/2018.  [name] Anemia due to chronic blood loss 2018/19   GI: transfusions x >20  required; multiple endoscopies and bleeding scans unrevealing.  Hemolysis ruled out by hematologist.  Marshell Levan and octreotide + monthly iron infusions as of 04/2017.  . Bilateral renal cysts    simple (03/2014 MRI)  . CAD (coronary artery disease)   . Cholelithiases 2018   asymptomatic  . Chronic diastolic heart failure ([place])   . Cirrhosis ([place]) 05/2016   secondary to NASH; most recent u/s abd 07/2016 showed splenomegaly.  Hx of portal HTN changes with mild ascites and splenomegaly.  . Diabetes mellitus with complication ([place]) 66/0630   A1c 6.8%  . History of blood transfusion 2018 X 4 dates   "low blood" (09/21/2016)  . Hyperlipemia, mixed    elevated LFTs when on statins.    . Hypertension    Cr bump 04/01/16 so I changed benicar-hct to benicar plain and added amlodipine 5 mg.  . Iron deficiency anemia 05/2016   Acute blood loss anemia: hospitalized, required transfusion x 3 U: colonoscopy and capsule study unrevealing.  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  W/u for ? hemolytic anemia to be pursued by hematologist as outpt.  Dr. [name], GI at [facility] following, too---he rec'd onc do bone marrow bx as of Jan 2019  . Microscopic hematuria    Eval unremarkable by Dr. Eulogio Ditch.  [name] NASH (nonalcoholic steatohepatitis)    Fatty liver on MR abd 03/2014, + hx of elevated transaminases and bili: followed by Dr. [name].  . Obesity   . Spleen enlarged     SURGICAL HISTORY: Past Surgical History:  Procedure Laterality Date  . CARDIAC CATHETERIZATION    . CARDIOVASCULAR STRESS TEST  01/17/2012; 05/04/16   Normal stress nuclear study x 2 (2018--Normal perfusion. LVEF 66% with normal wall motion. This is a low risk study).  . CERVICAL DISCECTOMY  1992  . COLONOSCOPY N/A 05/27/2016   No site/explanation for blood loss found.  Erythematous mucosa in cecum and ascending colon--Cecal bx: normal.  Procedure: COLONOSCOPY;   Surgeon: Carol Ada, MD;  Location: [facility] ENDOSCOPY;  Service: Endoscopy;  Laterality: N/A;  . COLONOSCOPY W/ POLYPECTOMY  09/10/2014   Polypectomy x 2: recall 5 yrs (Dr. [name]).  . CORONARY ANGIOPLASTY    . CORONARY ARTERY BYPASS GRAFT  03/2006  . ENTEROSCOPY N/A 07/31/2016   Procedure: ENTEROSCOPY;  Surgeon: Ladene Artist, MD;  Location: [facility] ENDOSCOPY;  Service: Endoscopy;  Laterality: N/A;  . ENTEROSCOPY N/A 12/02/2016   Procedure: ENTEROSCOPY;  Surgeon: Carol Ada, MD;  Location: [facility] ENDOSCOPY;  Service: Endoscopy;  Laterality: N/A;  . ESOPHAGOGASTRODUODENOSCOPY N/A 05/25/2016   No site/explanation for blood loss found.  Procedure: ESOPHAGOGASTRODUODENOSCOPY (EGD);  Surgeon: Juanita Craver, MD;  Location: [facility] ENDOSCOPY;  Service: Endoscopy;  Laterality: N/A;  . ESOPHAGOGASTRODUODENOSCOPY N/A 09/23/2016   Procedure: ESOPHAGOGASTRODUODENOSCOPY (EGD);  Surgeon: Carol Ada, MD;  Location: [place];  Service: Endoscopy;  Laterality: N/A;  . GIVENS CAPSULE STUDY N/A 05/27/2016   No source identified.  Repeat 10/2016--results pending.  Procedure: GIVENS CAPSULE STUDY;  Surgeon: Carol Ada, MD;  Location[facility] ENDOSCOPY;  Service: Endoscopy;  Laterality: N/A;  . GIVENS CAPSULE STUDY N/A 10/15/2016  Procedure: GIVENS CAPSULE STUDY;  Surgeon: Carol Ada, MD;  Location: [facility] ENDOSCOPY;  Service: Endoscopy;  Laterality: N/A;  . GIVENS CAPSULE STUDY N/A 02/13/2017   Procedure: GIVENS CAPSULE STUDY;  Surgeon: Carol Ada, MD;  Location: [place];  Service: Endoscopy;  Laterality: N/A;  . NECK SURGERY  1991  . NM GI BLOOD LOSS  01/27/2017   NEGATIVE  . SMALL BOWEL ENTEROSCOPY  10/2016   ([facility], Dr. [name]: Double balloon enteroscopy--to the level of proximal ileum) jejunal polyps- which were removed--not bleeding & benign.  No source of bleeding was identified.  . TRANSTHORACIC ECHOCARDIOGRAM  05/25/2016   EF 60%, normal wall motion, grd II DD, mild aortic stenosis, dilated aortic root and ascending  aorta.  [name] VASECTOMY  1977    SOCIAL HISTORY: Social History   Socioeconomic History  . Marital status: Married    Spouse name: Not on file  . Number of children: Not on file  . Years of education: Not on file  . Highest education level: Not on file  Occupational History  . Not on file  Social Needs  . Financial resource strain: Not on file  . Food insecurity:    Worry: Not on file    Inability: Not on file  . Transportation needs:    Medical: Not on file    Non-medical: Not on file  Tobacco Use  . Smoking status: Former Smoker    Types: Cigarettes  . Smokeless tobacco: Never Used  . Tobacco comment: QUIT I 1983  Substance and Sexual Activity  . Alcohol use: No  . Drug use: No  . Sexual activity: Yes  Lifestyle  . Physical activity:    Days per week: 0 days    Minutes per session: 0 min  . Stress: Not on file  Relationships  . Social connections:    Talks on phone: Not on file    Gets together: Not on file    Attends religious service: Not on file    Active member of club or organization: Not on file    Attends meetings of clubs or organizations: Not on file    Relationship status: Not on file  . Intimate partner violence:    Fear of current or ex partner: Not on file    Emotionally abused: Not on file    Physically abused: Not on file    Forced sexual activity: Not on file  Other Topics Concern  . Not on file  Social History Narrative   Married, 3 grown children, 3 GCs.   Educ: 10th grade.   Occupation: Retired Brewing technologist.   Tob: quit 1983, smoked about 20 pack-yr hx prior.   No alcohol.    FAMILY HISTORY: Family History  Problem Relation Age of Onset  . Hypertension Mother   . Cancer - Other Mother        liver cancer  . Heart disease Father   . Heart attack Father   . Cancer - Lung Father   . Diabetes Father   . Liver disease Sister   . Anemia Sister   . Heart disease Sister   . Heart attack Sister   . Heart disease Brother        CABG  20 YEARS AGO  . Hypertension Brother   . Mesothelioma Brother        HALF-BROTHER  . Cancer - Other Brother        CLL  . Cancer - Lung Brother  Mets to brain  . Cancer - Lung Sister        HALF-SISTER  . Liver disease Brother   . Heart disease Brother        CABG-2012  . Emphysema Maternal Grandfather   . Cancer - Other Paternal Grandmother        Stomach  . Heart attack Paternal Grandfather     ALLERGIES:  is allergic to statins.  MEDICATIONS:  Current Outpatient Medications  Medication Sig Dispense Refill  . acetaminophen (TYLENOL) 325 MG tablet Take 325 mg by mouth every 6 (six) hours as needed for fever (for fever/pain.).     [name] cholecalciferol (VITAMIN D) 1000 units tablet Take 1,000 Units by mouth daily.    . fluticasone (FLONASE) 50 MCG/ACT nasal spray Place 2 sprays into both nostrils daily. 16 g 6  . lactulose (CHRONULAC) 10 GM/15ML solution Take 45 mLs (30 g total) by mouth 3 (three) times daily. Take this until you have three BM's a day. 240 mL 0  . metoprolol tartrate (LOPRESSOR) 25 MG tablet Take 0.5 tablets (12.5 mg total) by mouth 2 (two) times daily. 60 tablet 6  . Octreotide Acetate (SANDOSTATIN IJ) Inject 1 Dose as directed every 30 (thirty) days.    . ondansetron (ZOFRAN) 4 MG tablet Take 1 tablet (4 mg total) by mouth every 8 (eight) hours as needed for nausea or vomiting. 10 tablet 1  . pantoprazole (PROTONIX) 40 MG tablet TAKE 1 TABLET BY MOUTH TWICE DAILY BEFORE A MEAL 60 tablet 3  . potassium chloride SA (K-DUR,KLOR-CON) 20 MEQ tablet TAKE 1 TABLET (20 MEQ TOTAL) BY MOUTH TWICE A DAY 60 tablet 6  . spironolactone (ALDACTONE) 25 MG tablet Take 12.5 mg by mouth daily.    [name] torsemide (DEMADEX) 20 MG tablet TAKE 2 TABLETS (40 MG TOTAL) BY MOUTH DAILY. 60 tablet 3  . tranexamic acid (LYSTEDA) 650 MG TABS tablet TAKE 2 TABLETS BY MOUTH 3 TIMES DAILY. 180 tablet 1   No current facility-administered medications for this visit.    Facility-Administered  Medications Ordered in Other Visits  Medication Dose Route Frequency Provider Last Rate Last Dose  . heparin lock flush 100 unit/mL  500 Units Intracatheter Daily PRN Truitt Merle, MD      . sodium chloride flush (NS) 0.9 % injection 10 mL  10 mL Intracatheter PRN Truitt Merle, MD        REVIEW OF SYSTEMS:    A 10+ POINT REVIEW OF SYSTEMS WAS OBTAINED including neurology, dermatology, psychiatry, cardiac, respiratory, lymph, extremities, GI, GU, Musculoskeletal, constitutional, breasts, reproductive, HEENT.  All pertinent positives are noted in the HPI.  All others are negative.    PHYSICAL EXAMINATION:   ECOG PERFORMANCE STATUS: 1 - Symptomatic but completely ambulatory  Vitals:   06/30/17 0951  BP: (!) 104/55  Pulse: 62  Resp: 18  Temp: 98.1 F (36.7 C)  SpO2: 92%   Filed Weights   06/30/17 0951  Weight: 241 lb 1.6 oz (109.4 kg)   .Body mass index is 35.6 kg/m.  GENERAL:alert, in no acute distress and comfortable SKIN: no acute rashes, no significant lesions EYES: conjunctiva are pink and non-injected, sclera anicteric OROPHARYNX: MMM, no exudates, no oropharyngeal erythema or ulceration NECK: supple, no JVD LYMPH:  no palpable lymphadenopathy in the cervical, axillary or inguinal regions LUNGS: clear to auscultation b/l with normal respiratory effort HEART: regular rate & rhythm ABDOMEN:  normoactive bowel sounds , non tender, not distended. Extremity: no pedal edema PSYCH: alert &  oriented x 3 with fluent speech NEURO: no focal motor/sensory deficits   LABORATORY DATA:  I have reviewed the data as listed  . CBC Latest Ref Rng & Units 06/30/2017 06/16/2017 06/02/2017  WBC 4.0 - 10.3 K/uL 5.4 5.2 5.2  Hemoglobin 13.0 - 17.1 g/dL 8.5(L) 9.9(L) 9.6(L)  Hematocrit 38.4 - 49.9 % 27.3(L) 31.1(L) 30.7(L)  Platelets 140 - 400 K/uL 97(L) 95(L) 109(L)    CMP Latest Ref Rng & Units 06/16/2017 06/02/2017 05/11/2017  Glucose 70 - 140 mg/dL 108 109 175(H)  BUN 7 - 26 mg/dL 12 17 12     Creatinine 0.70 - 1.30 mg/dL 1.31(H) 1.56(H) 1.32(H)  Sodium 136 - 145 mmol/L 136 136 138  Potassium 3.5 - 5.1 mmol/L 4.4 4.2 3.8  Chloride 98 - 109 mmol/L 107 106 109  CO2 22 - 29 mmol/L 24 22 21(L)  Calcium 8.4 - 10.4 mg/dL 8.6 8.8 8.5(L)  Total Protein 6.4 - 8.3 g/dL 6.1(L) 6.5 6.8  Total Bilirubin 0.2 - 1.2 mg/dL 4.4(HH) 4.0(HH) 5.1(H)  Alkaline Phos 40 - 150 U/L 101 96 112  AST 5 - 34 U/L 33 28 38  ALT 0 - 55 U/L 26 22 27     Lab Results  Component Value Date   FERRITIN 72 06/30/2017   . RADIOGRAPHIC STUDIES: I have personally reviewed the radiological images as listed and agreed with the findings in the report. No results found.  ASSESSMENT & PLAN:   67 y.o. male with multiple medical comorbidities with Karlene Lineman with liver cirrhosis with portal hypertension, esophageal varices and portal hypertensive gastropathy with ongoing chronic GI bleeding that has not been   #1 Chronic blood loss anemia.  Patient has had recurrent GI bleeding requiring > 20 units of PRBCs. since April 2018 and continues to have ongoing GI bleeding.  Previous workup showed LDH within normal limits at 220 and suggests against overt hemolysis. Sedimentation rate within normal limits. Coombs' test is negative. Myeloma panel and serum free light chains suggest no monoclonal paraproteinemia. PNH testing negative Slightly lower haptoglobin levels can be related to decreased hepatic production, low-level hemolysis due to transfusions or extravascular hemolysis due to splenic or hepatic sequestration.  small bowel endoscopy done on 12/02/2016: impression - The examined portion of the jejunum was normal.  - Normal examined duodenum. - Portal hypertensive gastropathy.  -Small (< 5 mm) esophageal varices. - No specimens collected.  Capsule Endoscopy on 02/15/17: No evidence of any bleeding during this examination. There was the possibility of an atypical AVM manifested as an erythematous patch. No evidence of any  ulcerations, erosions, masses, or polyps.  Recent rbc tagged study unrevealing. Recent labs on 05/05/2017 showed: Retic Ct Pct at 3.2, RBC at 3.34, and Hgb at 9.4.  PLAN  -overall his PRBC transfusion requirements have decreased. -still having ongoing sub clinical GI losses. --he has had previous extensive GI evaluation with no overt source evidence of controllable GIB at this time. -Continue on increased lysteda 1361m po 3x daily given ongoing GI bleeding. -continue LA sandostatin given as a shot every 4 weeks (As per recommendation of Dr WMalka So- [facility]) -continue f/u with GI -Discussed pt labwork today, 06/30/17; Hgb decreased to 8.5, blood counts are otherwise stable.  -Continue monthly IV Injectafer to reduce transfusion needs ferritin dropped to 72 -No need for PRBC transfusion today -Will see pt back in 4 weeks unless earlier if necessary    Continue Monthly IV Iron and Sandostatin shots--please schedule next 3 months RTC with Dr KIrene Limbo  in 1 months with labs    All of the patients questions were answered with apparent satisfaction. The patient knows to call the clinic with any problems, questions or concerns.  . The total time spent in the appointment was 15 minutes and more than 50% was on counseling and direct patient cares.       Sullivan Lone MD [place] AAHIVMS [facility] Hematology/Oncology Physician [place]  (Office):       [phone] (Work cell):  [phone] (Fax):           [phone]  This document serves as a record of services personally performed by Sullivan Lone, MD. It was created on his behalf by Baldwin Jamaica, a trained medical scribe. The creation of this record is based on the scribe's personal observations and the provider's statements to them.   .I have reviewed the above documentation for accuracy and completeness, and I agree with the above. Brunetta Genera MD MS

## 2017-06-30 ENCOUNTER — Inpatient Hospital Stay (HOSPITAL_BASED_OUTPATIENT_CLINIC_OR_DEPARTMENT_OTHER): Payer: 59 | Admitting: Hematology

## 2017-06-30 ENCOUNTER — Telehealth: Payer: Self-pay | Admitting: Hematology

## 2017-06-30 ENCOUNTER — Inpatient Hospital Stay: Payer: 59

## 2017-06-30 ENCOUNTER — Encounter: Payer: Self-pay | Admitting: Hematology

## 2017-06-30 VITALS — BP 120/65 | HR 58 | Temp 97.8°F | Resp 17

## 2017-06-30 VITALS — BP 104/55 | HR 62 | Temp 98.1°F | Resp 18 | Ht 69.0 in | Wt 241.1 lb

## 2017-06-30 DIAGNOSIS — K922 Gastrointestinal hemorrhage, unspecified: Secondary | ICD-10-CM | POA: Diagnosis not present

## 2017-06-30 DIAGNOSIS — E119 Type 2 diabetes mellitus without complications: Secondary | ICD-10-CM | POA: Diagnosis not present

## 2017-06-30 DIAGNOSIS — D500 Iron deficiency anemia secondary to blood loss (chronic): Secondary | ICD-10-CM | POA: Diagnosis not present

## 2017-06-30 DIAGNOSIS — D62 Acute posthemorrhagic anemia: Secondary | ICD-10-CM

## 2017-06-30 LAB — CBC WITH DIFFERENTIAL (CANCER CENTER ONLY)
BASOS PCT: 2 %
Basophils Absolute: 0.1 10*3/uL (ref 0.0–0.1)
EOS ABS: 0.8 10*3/uL — AB (ref 0.0–0.5)
Eosinophils Relative: 15 %
HCT: 27.3 % — ABNORMAL LOW (ref 38.4–49.9)
HEMOGLOBIN: 8.5 g/dL — AB (ref 13.0–17.1)
Lymphocytes Relative: 17 %
Lymphs Abs: 0.9 10*3/uL (ref 0.9–3.3)
MCH: 27.5 pg (ref 27.2–33.4)
MCHC: 31.1 g/dL — AB (ref 32.0–36.0)
MCV: 88.3 fL (ref 79.3–98.0)
MONOS PCT: 18 %
Monocytes Absolute: 0.9 10*3/uL (ref 0.1–0.9)
NEUTROS PCT: 48 %
Neutro Abs: 2.6 10*3/uL (ref 1.5–6.5)
PLATELETS: 97 10*3/uL — AB (ref 140–400)
RBC: 3.09 MIL/uL — ABNORMAL LOW (ref 4.20–5.82)
RDW: 18.3 % — ABNORMAL HIGH (ref 11.0–14.6)
WBC: 5.4 10*3/uL (ref 4.0–10.3)

## 2017-06-30 LAB — RETICULOCYTES
RBC.: 3.09 MIL/uL — AB (ref 4.20–5.82)
RETIC CT PCT: 4 % — AB (ref 0.8–1.8)
Retic Count, Absolute: 123.6 10*3/uL — ABNORMAL HIGH (ref 34.8–93.9)

## 2017-06-30 LAB — FERRITIN: Ferritin: 72 ng/mL (ref 22–316)

## 2017-06-30 MED ORDER — OCTREOTIDE ACETATE 30 MG IM KIT
30.0000 mg | PACK | Freq: Once | INTRAMUSCULAR | Status: AC
  Administered 2017-06-30: 30 mg via INTRAMUSCULAR

## 2017-06-30 MED ORDER — SODIUM CHLORIDE 0.9 % IV SOLN
750.0000 mg | Freq: Once | INTRAVENOUS | Status: AC
  Administered 2017-06-30: 750 mg via INTRAVENOUS
  Filled 2017-06-30: qty 15

## 2017-06-30 NOTE — Telephone Encounter (Signed)
Appointments scheduled AVS/Calendar printed per 5/24 los

## 2017-06-30 NOTE — Patient Instructions (Signed)
Thank you for choosing [facility] to provide your oncology and hematology care.  To afford each patient quality time with our providers, please arrive 30 minutes before your scheduled appointment time.  If you arrive late for your appointment, you may be asked to reschedule.  We strive to give you quality time with our providers, and arriving late affects you and other patients whose appointments are after yours.   If you are a no show for multiple scheduled visits, you may be dismissed from the clinic at the providers discretion.    Again, thank you for choosing [facility], our hope is that these requests will decrease the amount of time that you wait before being seen by our physicians.  ______________________________________________________________________  Should you have questions after your visit to the [facility], please contact our office at [phone] between the hours of 8:30 and 4:30 p.m.    Voicemails left after 4:30p.m will not be returned until the following business day.    For prescription refill requests, please have your pharmacy contact us directly.  Please also try to allow 48 hours for prescription requests.    Please contact the scheduling department for questions regarding scheduling.  For scheduling of procedures such as PET scans, CT scans, MRI, Ultrasound, etc please contact central scheduling at [phone].    Resources For Cancer Patients and Caregivers:   Oncolink.org:  A wonderful resource for patients and healthcare providers for information regarding your disease, ways to tract your treatment, what to expect, etc.     American Cancer Society:  800-227-2345  Can help patients locate various types of support and financial assistance  Cancer Care: 1-800-813-HOPE (4673) Provides financial assistance, online support groups, medication/co-pay assistance.    [place] DSS:  336-641-3447 Where to apply for food  stamps, Medicaid, and utility assistance  Medicare Rights Center: 800-333-4114 Helps people with Medicare understand their rights and benefits, navigate the Medicare system, and secure the quality healthcare they deserve  SCAT: 336-333-6589 [place] [employer]'s shared-ride transportation service for eligible riders who have a disability that prevents them from riding the fixed route bus.    For additional information on assistance programs please contact our social worker:   Grier Hock/Abigail Elmore:  [phone]            

## 2017-06-30 NOTE — Patient Instructions (Signed)
Ferric carboxymaltose injection What is this medicine? FERRIC CARBOXYMALTOSE (ferr-ik car-box-ee-mol-toes) is an iron complex. Iron is used to make healthy red blood cells, which carry oxygen and nutrients throughout the body. This medicine is used to treat anemia in people with chronic kidney disease or people who cannot take iron by mouth. This medicine may be used for other purposes; ask your health care provider or pharmacist if you have questions. COMMON BRAND NAME(S): Injectafer What should I tell my health care provider before I take this medicine? They need to know if you have any of these conditions: -anemia not caused by low iron levels -high levels of iron in the blood -liver disease -an unusual or allergic reaction to iron, other medicines, foods, dyes, or preservatives -pregnant or trying to get pregnant -breast-feeding How should I use this medicine? This medicine is for infusion into a vein. It is given by a health care professional in a hospital or clinic setting. Talk to your pediatrician regarding the use of this medicine in children. Special care may be needed. Overdosage: If you think you have taken too much of this medicine contact a poison control center or emergency room at once. NOTE: This medicine is only for you. Do not share this medicine with others. What if I miss a dose? It is important not to miss your dose. Call your doctor or health care professional if you are unable to keep an appointment. What may interact with this medicine? Do not take this medicine with any of the following medications: -deferoxamine -dimercaprol -other iron products This medicine may also interact with the following medications: -chloramphenicol -deferasirox This list may not describe all possible interactions. Give your health care provider a list of all the medicines, herbs, non-prescription drugs, or dietary supplements you use. Also tell them if you smoke, drink alcohol, or use  illegal drugs. Some items may interact with your medicine. What should I watch for while using this medicine? Visit your doctor or health care professional regularly. Tell your doctor if your symptoms do not start to get better or if they get worse. You may need blood work done while you are taking this medicine. You may need to follow a special diet. Talk to your doctor. Foods that contain iron include: whole grains/cereals, dried fruits, beans, or peas, leafy green vegetables, and organ meats (liver, kidney). What side effects may I notice from receiving this medicine? Side effects that you should report to your doctor or health care professional as soon as possible: -allergic reactions like skin rash, itching or hives, swelling of the face, lips, or tongue -breathing problems -changes in blood pressure -feeling faint or lightheaded, falls -flushing, sweating, or hot feelings Side effects that usually do not require medical attention (report to your doctor or health care professional if they continue or are bothersome): -changes in taste -constipation -dizziness -headache -nausea -pain, redness, or irritation at site where injected -vomiting This list may not describe all possible side effects. Call your doctor for medical advice about side effects. You may report side effects to FDA at 1-800-FDA-1088. Where should I keep my medicine? This drug is given in a hospital or clinic and will not be stored at home. NOTE: This sheet is a summary. It may not cover all possible information. If you have questions about this medicine, talk to your doctor, pharmacist, or health care provider.  2018 Elsevier/Gold Standard (2015-02-26 11:20:47)  

## 2017-07-04 MED FILL — POTASSIUM CL ER 20 MEQ TABL: 20 | 30 days supply | Qty: 60 | Fill #2

## 2017-07-06 ENCOUNTER — Encounter: Payer: Self-pay | Admitting: Family Medicine

## 2017-07-12 ENCOUNTER — Other Ambulatory Visit: Payer: Self-pay | Admitting: Family Medicine

## 2017-07-12 MED FILL — TORSEMIDE 20 MG TABS: 20 | 30 days supply | Qty: 60 | Fill #0

## 2017-07-12 MED FILL — PANTOPRAZOLE SOD DR 40 MG T: 40 | 30 days supply | Qty: 60 | Fill #0

## 2017-07-18 ENCOUNTER — Encounter: Payer: Self-pay | Admitting: Hematology

## 2017-07-26 ENCOUNTER — Other Ambulatory Visit: Payer: Self-pay | Admitting: Family Medicine

## 2017-07-26 MED FILL — METOPROLOL TARTRATE 25 MG T: 25 | 30 days supply | Qty: 30 | Fill #0

## 2017-07-26 MED FILL — TRANEXAMIC ACID 650 MG TAB: 650 | 30 days supply | Qty: 180 | Fill #1

## 2017-07-26 MED FILL — GENERLAC 10 GM/15 ML SOLN: 10 | 30 days supply | Qty: 2700 | Fill #2

## 2017-07-27 ENCOUNTER — Encounter: Payer: Self-pay | Admitting: Hematology

## 2017-07-27 ENCOUNTER — Encounter: Payer: Self-pay | Admitting: Family Medicine

## 2017-07-27 ENCOUNTER — Ambulatory Visit (INDEPENDENT_AMBULATORY_CARE_PROVIDER_SITE_OTHER): Payer: 59 | Admitting: Family Medicine

## 2017-07-27 ENCOUNTER — Encounter: Payer: Self-pay | Admitting: *Deleted

## 2017-07-27 VITALS — BP 131/66 | HR 61 | Temp 98.2°F | Resp 16 | Ht 68.5 in | Wt 239.4 lb

## 2017-07-27 DIAGNOSIS — E669 Obesity, unspecified: Secondary | ICD-10-CM

## 2017-07-27 DIAGNOSIS — R0600 Dyspnea, unspecified: Secondary | ICD-10-CM

## 2017-07-27 DIAGNOSIS — Z125 Encounter for screening for malignant neoplasm of prostate: Secondary | ICD-10-CM

## 2017-07-27 DIAGNOSIS — Z0000 Encounter for general adult medical examination without abnormal findings: Secondary | ICD-10-CM

## 2017-07-27 DIAGNOSIS — R918 Other nonspecific abnormal finding of lung field: Secondary | ICD-10-CM | POA: Diagnosis not present

## 2017-07-27 DIAGNOSIS — R0609 Other forms of dyspnea: Secondary | ICD-10-CM | POA: Diagnosis not present

## 2017-07-27 NOTE — Progress Notes (Signed)
HEMATOLOGY/ONCOLOGY CLINIC NOTE  Date of Service: 07/28/17  Patient Care Team: Tammi Sou, MD as PCP - General (Family Medicine) Juanita Craver, MD as Consulting Physician (Gastroenterology) Franchot Gallo, MD as Consulting Physician (Urology) Troy Sine, MD as Consulting Physician (Cardiology) Brunetta Genera, MD as Consulting Physician (Hematology) [name], Gaspar Skeeters, MD as Consulting Physician (Gastroenterology)  CHIEF COMPLAINTS/PURPOSE OF CONSULTATION:   F/u for anemia and ongoing GI bleeding.  HISTORY OF PRESENTING ILLNESS:   William [name] is a wonderful 67 y.o. male who has been referred to [place] by Dr .William [name], William Blackwater, MD for evaluation and management of Anemia.  Patient has a history of extensive medical comorbidities including liver cirrhosis related to [facility] with portal hypertension, esophageal varices and portal hypertensive gastropathy and splenomegaly, iron deficiency anemia, obesity, diabetes, AAA.  Patient was admitted in April 2018 with acute blood loss anemia and required transfusion of multiple units of PRBCs with iron profile suggestive of iron deficiency. No overt evidence of hemolysis noted. He had an EGD and colonoscopy that did not show any overt bleeding. Capsule endoscopy was unrevealing but the capsule past and only 27 minutes.  He follows with Dr. Collene [name] who is his gastric oncologist.  Patient was again readmitted in June 2018 with symptomatically anemia and hemoglobin of 6.3. He underwent 4 units of PRBC transfusions again and was sent out on PPI and iron supplementation with the plan to follow-up with Dr. Collene [name].  He was also given hematology referral. He had a repeat ultrasound of the abdomen on 07/29/2016 which showed significant increase in splenomegaly in 2 months suggesting significant portal hypertension versus some element of splenic sequestration.  He continues to note intermittent black stools.  Hemoglobin is stable and  improved today and is up to 11.1. Patient notes he feels better after his transfusions. Has not noted lower GI bleeding at this time. We discuss any other workup to rule out less likely other possibilities of his anemia.  INTERVAL HISTORY  William [name] is here for a scheduled follow-up of his anemia that is primarily related to ongoing issues with GI bleeding and has not been controllable by multiple GI interventions. The patient's last visit with [place] was on 06/30/17. He is accompanied today by his wife. The pt reports that he is doing well overall and is looking forward to going to the beach next week.   The pt has not required a blood transfusion in the last 3 months. The pt reports that he has had some insurance difficulties about continuing Sandostatin, which is unresolved at this time, though he was due for his injection today. He notes that though he is still a little fatigued, he feels well. He also notes that his stools are normal and have greatly improved compared to last year. He will see his PCP next in early August.   The pt saw his PCP yesterday and notes that his PCP heard some fluid around his lungs, and will receive a CXR later today. The pt denies coughing, fevers, and leg swelling. He notes that his baseline weight is about 231 pounds on his home scale. He continues to take Torsemide and Spirolactone.   Lab results today (07/27/17) of CBC and Reticulocytes is as follows: all values are WNL except for RBC at 3.40, HGB at 9.4, HCT at 29.8, MCHC at 31.5, RDW at 18.6, PLT at 91, Lymphs abs at 600, Monocytes abs at 1.0k, Eosinophils abs at 700, Retic ct pct at 2.6%. CMP  07/28/17 is noted Ferritin 07/28/17 is 80  On review of systems, pt reports some fatigue, SOB on exertion, normal stools, and denies constipation, stomach cramping, fevers, chills, night sweats, coughing, leg swelling, abdominal pains, and any other symptoms.    MEDICAL HISTORY:  Past Medical History:  Diagnosis Date  . AAA  (abdominal aortic aneurysm) ([place]) 03/2014   3.2 cm on MR abd.  F/u aortic u/s 06/2014 showed 3.0 x 3.1 cm AAA: recheck 2 yrs recommended (followed by cardiologist).  Minimal growth on 10/2016 CT abd done for epig pain.  Repeat u/s 10/2018.  [name] Anemia due to chronic blood loss 2018/19   GI: transfusions x >20 required; multiple endoscopies and bleeding scans unrevealing.  Hemolysis ruled out by hematologist.  Marshell Levan and octreotide + monthly iron infusions as of 04/2017.  . Bilateral renal cysts    simple (03/2014 MRI)  . CAD (coronary artery disease)   . Cholelithiases 2018   asymptomatic  . Chronic diastolic heart failure ([place])   . Cirrhosis ([place]) 05/2016   secondary to NASH; most recent u/s abd 07/2016 showed splenomegaly.  Hx of portal HTN changes with mild ascites and splenomegaly.  . Diabetes mellitus with complication ([place]) 42/8768   A1c 6.8%  . History of blood transfusion 2018 X 4 dates   "low blood" (09/21/2016)  . Hyperlipemia, mixed    elevated LFTs when on statins.    . Hypertension    Cr bump 04/01/16 so I changed benicar-hct to benicar plain and added amlodipine 5 mg.  . Iron deficiency anemia 05/2016   Acute blood loss anemia: hospitalized, required transfusion x 3 U: colonoscopy and capsule study unrevealing.  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  W/u for ? hemolytic anemia to be pursued by hematologist as outpt.  Dr. [name], GI at [facility] following, too---he rec'd onc do bone marrow bx as of Jan 2019  . Microscopic hematuria    Eval unremarkable by Dr. Eulogio Ditch.  [name] NASH (nonalcoholic steatohepatitis)    Fatty liver on MR abd 03/2014, + hx of elevated transaminases and bili: followed by Dr. Collene [name].  . Obesity   . Spleen enlarged     SURGICAL HISTORY: Past Surgical History:  Procedure Laterality Date  . CARDIAC CATHETERIZATION    . CARDIOVASCULAR STRESS TEST  01/17/2012; 05/04/16   Normal  stress nuclear study x 2 (2018--Normal perfusion. LVEF 66% with normal wall motion. This is a low risk study).  . CERVICAL DISCECTOMY  1992  . COLONOSCOPY N/A 05/27/2016   No site/explanation for blood loss found.  Erythematous mucosa in cecum and ascending colon--Cecal bx: normal.  Procedure: COLONOSCOPY;  Surgeon: Carol Ada, MD;  Location: [facility] ENDOSCOPY;  Service: Endoscopy;  Laterality: N/A;  . COLONOSCOPY W/ POLYPECTOMY  09/10/2014   Polypectomy x 2: recall 5 yrs (Dr. Collene [name]).  . CORONARY ANGIOPLASTY    . CORONARY ARTERY BYPASS GRAFT  03/2006  . ENTEROSCOPY N/A 07/31/2016   Procedure: ENTEROSCOPY;  Surgeon: Ladene Artist, MD;  Location: [facility] ENDOSCOPY;  Service: Endoscopy;  Laterality: N/A;  . ENTEROSCOPY N/A 12/02/2016   Procedure: ENTEROSCOPY;  Surgeon: Carol Ada, MD;  Location: [facility] ENDOSCOPY;  Service: Endoscopy;  Laterality: N/A;  . ESOPHAGOGASTRODUODENOSCOPY N/A 05/25/2016   No site/explanation for blood loss found.  Procedure: ESOPHAGOGASTRODUODENOSCOPY (EGD);  Surgeon: Juanita Craver, MD;  Location: [facility] ENDOSCOPY;  Service: Endoscopy;  Laterality: N/A;  . ESOPHAGOGASTRODUODENOSCOPY N/A 09/23/2016   Procedure:  ESOPHAGOGASTRODUODENOSCOPY (EGD);  Surgeon: Carol Ada, MD;  Location: [place];  Service: Endoscopy;  Laterality: N/A;  . GIVENS CAPSULE STUDY N/A 05/27/2016   No source identified.  Repeat 10/2016--results pending.  Procedure: GIVENS CAPSULE STUDY;  Surgeon: Carol Ada, MD;  Location: [facility] ENDOSCOPY;  Service: Endoscopy;  Laterality: N/A;  . GIVENS CAPSULE STUDY N/A 10/15/2016   Procedure: GIVENS CAPSULE STUDY;  Surgeon: Carol Ada, MD;  Location: [facility] ENDOSCOPY;  Service: Endoscopy;  Laterality: N/A;  . GIVENS CAPSULE STUDY N/A 02/13/2017   Procedure: GIVENS CAPSULE STUDY;  Surgeon: Carol Ada, MD;  Location: [place];  Service: Endoscopy;  Laterality: N/A;  . NECK SURGERY  1991  . NM GI BLOOD LOSS  01/27/2017   NEGATIVE  . SMALL BOWEL ENTEROSCOPY  10/2016   ([facility],  Dr. [name]: Double balloon enteroscopy--to the level of proximal ileum) jejunal polyps- which were removed--not bleeding & benign.  No source of bleeding was identified.  . TRANSTHORACIC ECHOCARDIOGRAM  05/25/2016   EF 60%, normal wall motion, grd II DD, mild aortic stenosis, dilated aortic root and ascending aorta.  [name] VASECTOMY  1977    SOCIAL HISTORY: Social History   Socioeconomic History  . Marital status: Married    Spouse name: Not on file  . Number of children: Not on file  . Years of education: Not on file  . Highest education level: Not on file  Occupational History  . Not on file  Social Needs  . Financial resource strain: Not on file  . Food insecurity:    Worry: Not on file    Inability: Not on file  . Transportation needs:    Medical: Not on file    Non-medical: Not on file  Tobacco Use  . Smoking status: Former Smoker    Types: Cigarettes  . Smokeless tobacco: Never Used  . Tobacco comment: QUIT I 1983  Substance and Sexual Activity  . Alcohol use: No  . Drug use: No  . Sexual activity: Yes  Lifestyle  . Physical activity:    Days per week: 0 days    Minutes per session: 0 min  . Stress: Not on file  Relationships  . Social connections:    Talks on phone: Not on file    Gets together: Not on file    Attends religious service: Not on file    Active member of club or organization: Not on file    Attends meetings of clubs or organizations: Not on file    Relationship status: Not on file  . Intimate partner violence:    Fear of current or ex partner: Not on file    Emotionally abused: Not on file    Physically abused: Not on file    Forced sexual activity: Not on file  Other Topics Concern  . Not on file  Social History Narrative   Married, 3 grown children, 3 GCs.   Educ: 10th grade.   Occupation: Retired Brewing technologist.   Tob: quit 1983, smoked about 20 pack-yr hx prior.   No alcohol.    FAMILY HISTORY: Family History  Problem Relation Age of  Onset  . Hypertension Mother   . Cancer - Other Mother        liver cancer  . Heart disease Father   . Heart attack Father   . Cancer - Lung Father   . Diabetes Father   . Liver disease Sister   . Anemia Sister   . Heart disease Sister   .  Heart attack Sister   . Heart disease Brother        CABG 20 YEARS AGO  . Hypertension Brother   . Mesothelioma Brother        HALF-BROTHER  . Cancer - Other Brother        CLL  . Cancer - Lung Brother        Mets to brain  . Cancer - Lung Sister        HALF-SISTER  . Liver disease Brother   . Heart disease Brother        CABG-2012  . Emphysema Maternal Grandfather   . Cancer - Other Paternal Grandmother        Stomach  . Heart attack Paternal Grandfather     ALLERGIES:  is allergic to statins.  MEDICATIONS:  Current Outpatient Medications  Medication Sig Dispense Refill  . acetaminophen (TYLENOL) 325 MG tablet Take 325 mg by mouth every 6 (six) hours as needed for fever (for fever/pain.).     [name] cholecalciferol (VITAMIN D) 1000 units tablet Take 1,000 Units by mouth daily.    . fluticasone (FLONASE) 50 MCG/ACT nasal spray Place 2 sprays into both nostrils daily. 16 g 6  . lactulose (CHRONULAC) 10 GM/15ML solution Take 45 mLs (30 g total) by mouth 3 (three) times daily. Take this until you have three BM's a day. 240 mL 0  . metoprolol tartrate (LOPRESSOR) 25 MG tablet TAKE 1/2 TABLET (12.5 MG TOTAL) BY MOUTH TWICE A DAY 30 tablet 5  . Octreotide Acetate (SANDOSTATIN IJ) Inject 1 Dose as directed every 30 (thirty) days.    . ondansetron (ZOFRAN) 4 MG tablet Take 1 tablet (4 mg total) by mouth every 8 (eight) hours as needed for nausea or vomiting. 10 tablet 1  . pantoprazole (PROTONIX) 40 MG tablet TAKE 1 TABLET BY MOUTH TWICE DAILY BEFORE A MEAL 60 tablet 3  . potassium chloride SA (K-DUR,KLOR-CON) 20 MEQ tablet TAKE 1 TABLET (20 MEQ TOTAL) BY MOUTH TWICE A DAY 60 tablet 6  . spironolactone (ALDACTONE) 25 MG tablet Take 12.5 mg by mouth  daily.    [name] torsemide (DEMADEX) 20 MG tablet TAKE 2 TABLETS (40 MG TOTAL) BY MOUTH DAILY. 60 tablet 3  . tranexamic acid (LYSTEDA) 650 MG TABS tablet TAKE 2 TABLETS BY MOUTH 3 TIMES DAILY. 180 tablet 1   No current facility-administered medications for this visit.    Facility-Administered Medications Ordered in Other Visits  Medication Dose Route Frequency Provider Last Rate Last Dose  . heparin lock flush 100 unit/mL  500 Units Intracatheter Daily PRN Truitt Merle, MD      . sodium chloride flush (NS) 0.9 % injection 10 mL  10 mL Intracatheter PRN Truitt Merle, MD        REVIEW OF SYSTEMS:    A 10+ POINT REVIEW OF SYSTEMS WAS OBTAINED including neurology, dermatology, psychiatry, cardiac, respiratory, lymph, extremities, GI, GU, Musculoskeletal, constitutional, breasts, reproductive, HEENT.  All pertinent positives are noted in the HPI.  All others are negative.   PHYSICAL EXAMINATION:   ECOG PERFORMANCE STATUS: 1 - Symptomatic but completely ambulatory  Vitals:   07/28/17 0848  BP: (!) 111/58  Pulse: 61  Resp: 17  Temp: 98.1 F (36.7 C)  SpO2: 95%   Filed Weights   07/28/17 0848  Weight: 239 lb 1.6 oz (108.5 kg)   .Body mass index is 35.83 kg/m.  GENERAL:alert, in no acute distress and comfortable SKIN: no acute rashes, no significant lesions EYES:  conjunctiva are pink and non-injected, sclera anicteric OROPHARYNX: MMM, no exudates, no oropharyngeal erythema or ulceration NECK: supple, no JVD LYMPH:  no palpable lymphadenopathy in the cervical, axillary or inguinal regions LUNGS: clear to auscultation b/l with normal respiratory effort HEART: regular rate & rhythm ABDOMEN:  normoactive bowel sounds , non tender, not distended. Extremity: no pedal edema PSYCH: alert & oriented x 3 with fluent speech NEURO: no focal motor/sensory deficits    LABORATORY DATA:  I have reviewed the data as listed  . CBC Latest Ref Rng & Units 07/28/2017 06/30/2017 06/16/2017  WBC 4.0 - 10.3  K/uL 5.5 5.4 5.2  Hemoglobin 13.0 - 17.1 g/dL 9.4(L) 8.5(L) 9.9(L)  Hematocrit 38.4 - 49.9 % 29.8(L) 27.3(L) 31.1(L)  Platelets 140 - 400 K/uL 91(L) 97(L) 95(L)    CMP Latest Ref Rng & Units 07/28/2017 06/16/2017 06/02/2017  Glucose 70 - 140 mg/dL 121 108 109  BUN 7 - 26 mg/dL 12 12 17   Creatinine 0.70 - 1.30 mg/dL 1.41(H) 1.31(H) 1.56(H)  Sodium 136 - 145 mmol/L 136 136 136  Potassium 3.5 - 5.1 mmol/L 3.8 4.4 4.2  Chloride 98 - 109 mmol/L 106 107 106  CO2 22 - 29 mmol/L 21(L) 24 22  Calcium 8.4 - 10.4 mg/dL 8.5 8.6 8.8  Total Protein 6.4 - 8.3 g/dL 6.1(L) 6.1(L) 6.5  Total Bilirubin 0.2 - 1.2 mg/dL 4.5(HH) 4.4(HH) 4.0(HH)  Alkaline Phos 40 - 150 U/L 96 101 96  AST 5 - 34 U/L 36(H) 33 28  ALT 0 - 55 U/L 21 26 22     Lab Results  Component Value Date   FERRITIN 72 06/30/2017   . RADIOGRAPHIC STUDIES: I have personally reviewed the radiological images as listed and agreed with the findings in the report. No results found.  ASSESSMENT & PLAN:   67 y.o. male with multiple medical comorbidities with Karlene Lineman with liver cirrhosis with portal hypertension, esophageal varices and portal hypertensive gastropathy with ongoing chronic GI bleeding that has not been   #1 Chronic blood loss anemia.  Patient has had recurrent GI bleeding requiring > 20 units of PRBCs. since April 2018 and continues to have ongoing GI bleeding.  Previous workup showed LDH within normal limits at 220 and suggests against overt hemolysis. Sedimentation rate within normal limits. Coombs' test is negative. Myeloma panel and serum free light chains suggest no monoclonal paraproteinemia. PNH testing negative Slightly lower haptoglobin levels can be related to decreased hepatic production, low-level hemolysis due to transfusions or extravascular hemolysis due to splenic or hepatic sequestration.  small bowel endoscopy done on 12/02/2016: impression - The examined portion of the jejunum was normal.  - Normal examined  duodenum. - Portal hypertensive gastropathy.  -Small (< 5 mm) esophageal varices. - No specimens collected.  Capsule Endoscopy on 02/15/17: No evidence of any bleeding during this examination. There was the possibility of an atypical AVM manifested as an erythematous patch. No evidence of any ulcerations, erosions, masses, or polyps.  Recent rbc tagged study unrevealing. Recent labs on 05/05/2017 showed: Retic Ct Pct at 3.2, RBC at 3.34, and Hgb at 9.4.  PLAN  -Discussed pt labwork today, 07/27/17; Hgb at 9.4 -Insurance difficulties are prohibitive from receiving Sandostatin injection today (this has helped keep him out of the hospital but achieve some control over the speed of his GI bleeding). -Will continue monthly IV Injectafer -No indication for PRBC transfusion today -Will see pt back in 1 month with labs and pt will continue discussion with insurance to receive  Sandostatin and will let me know if he needs anything from our end -overall his PRBC transfusion requirements have decreased with a combination of sandostatin + IV iron + lysteda -still having ongoing sub clinical GI losses as witnessed by  decreasing ferritin levels needing ongoing IV iron replacement. --he has had previous extensive GI evaluation with no overt source evidence of controllable GIB at this time. -Continue on lysteda 1393m po 3x daily given ongoing GI bleeding. -continue LA sandostatin given as a shot every 4 weeks (As per recommendation of Dr WMalka So- [facility]) -continue f/u with GI   Continue labs q4weeks- please schedule out 3 months IV Injectafer q4weeks -please schedule out 3 months Sandostatin q4weeks if approval from insurance obtained RTC with Dr KIrene Limboin 4 weeks with labs   All of the patients questions were answered with apparent satisfaction. The patient knows to call the clinic with any problems, questions or concerns.  The total time spent in the appt was 20 minutes and more than 50% was on counseling  and direct patient cares.    GSullivan LoneMD MPine CanyonAAHIVMS SBayfront Ambulatory Surgical Center LLCCWills Surgery Center In Northeast PhiladeLPhiaHematology/Oncology Physician CVid[facility] (Office):       [phone](Work cell):  [phone](Fax):           [phone] I, SBaldwin Jamaica am acting as a scribe for Dr KIrene Limbo   .Brunetta GeneraMD

## 2017-07-27 NOTE — Progress Notes (Signed)
Office Note 07/27/2017  CC:  Chief Complaint  Patient presents with  . Annual Exam    Pt is not fasting.     HPI:  William [name] is a 67 y.o. White male who is here for annual health maintenance exam. He states he does not want to get labs today b/c he'll be getting labs via hem/onc AND cardiologist soon.  Dental preventatives UTD. Eyes: due for exam.  Glucoses: 120-150 fasting.   He complains of chronic fatigue and some DOE but all this improves significantly with getting transfusions. No melena in last couple months.  No cough or fevers or hemoptysis.  No chest pain.  Past Medical History:  Diagnosis Date  . AAA (abdominal aortic aneurysm) ([place]) 03/2014   3.2 cm on MR abd.  F/u aortic u/s 06/2014 showed 3.0 x 3.1 cm AAA: recheck 2 yrs recommended (followed by cardiologist).  Minimal growth on 10/2016 CT abd done for epig pain.  Repeat u/s 10/2018.  [name] Anemia due to chronic blood loss 2018/19   GI: transfusions x >20 required; multiple endoscopies and bleeding scans unrevealing.  Hemolysis ruled out by hematologist.  Marshell Levan and octreotide + monthly iron infusions as of 04/2017.  . Bilateral renal cysts    simple (03/2014 MRI)  . CAD (coronary artery disease)   . Cholelithiases 2018   asymptomatic  . Chronic diastolic heart failure ([place])   . Cirrhosis ([place]) 05/2016   secondary to NASH; most recent u/s abd 07/2016 showed splenomegaly.  Hx of portal HTN changes with mild ascites and splenomegaly.  . Diabetes mellitus with complication ([place]) 59/1638   A1c 6.8%  . History of blood transfusion 2018 X 4 dates   "low blood" (09/21/2016)  . Hyperlipemia, mixed    elevated LFTs when on statins.    . Hypertension    Cr bump 04/01/16 so I changed benicar-hct to benicar plain and added amlodipine 5 mg.  . Iron deficiency anemia 05/2016   Acute blood loss anemia: hospitalized, required transfusion x 3 U: colonoscopy and capsule study unrevealing.  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  W/u for ? hemolytic anemia to be pursued by hematologist as outpt.  Dr. [name], GI at [facility] following, too---he rec'd onc do bone marrow bx as of Jan 2019  . Microscopic hematuria    Eval unremarkable by Dr. Eulogio Ditch.  [name] NASH (nonalcoholic steatohepatitis)    Fatty liver on MR abd 03/2014, + hx of elevated transaminases and bili: followed by Dr. [name].  . Obesity   . Spleen enlarged     Past Surgical History:  Procedure Laterality Date  . CARDIAC CATHETERIZATION    . CARDIOVASCULAR STRESS TEST  01/17/2012; 05/04/16   Normal stress nuclear study x 2 (2018--Normal perfusion. LVEF 66% with normal wall motion. This is a low risk study).  . CERVICAL DISCECTOMY  1992  . COLONOSCOPY N/A 05/27/2016   No site/explanation for blood loss found.  Erythematous mucosa in cecum and ascending colon--Cecal bx: normal.  Procedure: COLONOSCOPY;  Surgeon: Carol Ada, MD;  Location: [facility] ENDOSCOPY;  Service: Endoscopy;  Laterality: N/A;  . COLONOSCOPY W/ POLYPECTOMY  09/10/2014   Polypectomy x 2: recall 5 yrs (Dr. [name]).  . CORONARY ANGIOPLASTY    . CORONARY ARTERY BYPASS GRAFT  03/2006  . ENTEROSCOPY N/A 07/31/2016   Procedure: ENTEROSCOPY;  Surgeon: Ladene Artist, MD;  Location: [facility] ENDOSCOPY;  Service: Endoscopy;  Laterality: N/A;  . ENTEROSCOPY N/A 12/02/2016   Procedure: ENTEROSCOPY;  Surgeon: Carol Ada, MD;  Location: [facility] ENDOSCOPY;  Service: Endoscopy;  Laterality: N/A;  . ESOPHAGOGASTRODUODENOSCOPY N/A 05/25/2016   No site/explanation for blood loss found.  Procedure: ESOPHAGOGASTRODUODENOSCOPY (EGD);  Surgeon: Juanita Craver, MD;  Location: [facility] ENDOSCOPY;  Service: Endoscopy;  Laterality: N/A;  . ESOPHAGOGASTRODUODENOSCOPY N/A 09/23/2016   Procedure: ESOPHAGOGASTRODUODENOSCOPY (EGD);  Surgeon: Carol Ada, MD;  Location: [place];  Service: Endoscopy;  Laterality: N/A;  . GIVENS CAPSULE STUDY N/A  05/27/2016   No source identified.  Repeat 10/2016--results pending.  Procedure: GIVENS CAPSULE STUDY;  Surgeon: Carol Ada, MD;  Location: [facility] ENDOSCOPY;  Service: Endoscopy;  Laterality: N/A;  . GIVENS CAPSULE STUDY N/A 10/15/2016   Procedure: GIVENS CAPSULE STUDY;  Surgeon: Carol Ada, MD;  Location: [facility] ENDOSCOPY;  Service: Endoscopy;  Laterality: N/A;  . GIVENS CAPSULE STUDY N/A 02/13/2017   Procedure: GIVENS CAPSULE STUDY;  Surgeon: Carol Ada, MD;  Location: [place];  Service: Endoscopy;  Laterality: N/A;  . NECK SURGERY  1991  . NM GI BLOOD LOSS  01/27/2017   NEGATIVE  . SMALL BOWEL ENTEROSCOPY  10/2016   ([facility], Dr. [name]: Double balloon enteroscopy--to the level of proximal ileum) jejunal polyps- which were removed--not bleeding & benign.  No source of bleeding was identified.  . TRANSTHORACIC ECHOCARDIOGRAM  05/25/2016   EF 60%, normal wall motion, grd II DD, mild aortic stenosis, dilated aortic root and ascending aorta.  [name] VASECTOMY  1977    Family History  Problem Relation Age of Onset  . Hypertension Mother   . Cancer - Other Mother        liver cancer  . Heart disease Father   . Heart attack Father   . Cancer - Lung Father   . Diabetes Father   . Liver disease Sister   . Anemia Sister   . Heart disease Sister   . Heart attack Sister   . Heart disease Brother        CABG 20 YEARS AGO  . Hypertension Brother   . Mesothelioma Brother        HALF-BROTHER  . Cancer - Other Brother        CLL  . Cancer - Lung Brother        Mets to brain  . Cancer - Lung Sister        HALF-SISTER  . Liver disease Brother   . Heart disease Brother        CABG-2012  . Emphysema Maternal Grandfather   . Cancer - Other Paternal Grandmother        Stomach  . Heart attack Paternal Grandfather     Social History   Socioeconomic History  . Marital status: Married    Spouse name: Not on file  . Number of children: Not on file  . Years of education: Not on file  . Highest  education level: Not on file  Occupational History  . Not on file  Social Needs  . Financial resource strain: Not on file  . Food insecurity:    Worry: Not on file    Inability: Not on file  . Transportation needs:    Medical: Not on file    Non-medical: Not on file  Tobacco Use  . Smoking status: Former Smoker    Types: Cigarettes  . Smokeless tobacco: Never Used  . Tobacco comment: QUIT I 1983  Substance and Sexual Activity  . Alcohol use: No  .  Drug use: No  . Sexual activity: Yes  Lifestyle  . Physical activity:    Days per week: 0 days    Minutes per session: 0 min  . Stress: Not on file  Relationships  . Social connections:    Talks on phone: Not on file    Gets together: Not on file    Attends religious service: Not on file    Active member of club or organization: Not on file    Attends meetings of clubs or organizations: Not on file    Relationship status: Not on file  . Intimate partner violence:    Fear of current or ex partner: Not on file    Emotionally abused: Not on file    Physically abused: Not on file    Forced sexual activity: Not on file  Other Topics Concern  . Not on file  Social History Narrative   Married, 3 grown children, 3 GCs.   Educ: 10th grade.   Occupation: Retired Brewing technologist.   Tob: quit 1983, smoked about 20 pack-yr hx prior.   No alcohol.    Outpatient Medications Prior to Visit  Medication Sig Dispense Refill  . acetaminophen (TYLENOL) 325 MG tablet Take 325 mg by mouth every 6 (six) hours as needed for fever (for fever/pain.).     [name] cholecalciferol (VITAMIN D) 1000 units tablet Take 1,000 Units by mouth daily.    . fluticasone (FLONASE) 50 MCG/ACT nasal spray Place 2 sprays into both nostrils daily. 16 g 6  . lactulose (CHRONULAC) 10 GM/15ML solution Take 45 mLs (30 g total) by mouth 3 (three) times daily. Take this until you have three BM's a day. 240 mL 0  . metoprolol tartrate (LOPRESSOR) 25 MG tablet TAKE 1/2 TABLET  (12.5 MG TOTAL) BY MOUTH TWICE A DAY 30 tablet 5  . Octreotide Acetate (SANDOSTATIN IJ) Inject 1 Dose as directed every 30 (thirty) days.    . ondansetron (ZOFRAN) 4 MG tablet Take 1 tablet (4 mg total) by mouth every 8 (eight) hours as needed for nausea or vomiting. 10 tablet 1  . pantoprazole (PROTONIX) 40 MG tablet TAKE 1 TABLET BY MOUTH TWICE DAILY BEFORE A MEAL 60 tablet 3  . potassium chloride SA (K-DUR,KLOR-CON) 20 MEQ tablet TAKE 1 TABLET (20 MEQ TOTAL) BY MOUTH TWICE A DAY 60 tablet 6  . spironolactone (ALDACTONE) 25 MG tablet Take 12.5 mg by mouth daily.    [name] torsemide (DEMADEX) 20 MG tablet TAKE 2 TABLETS (40 MG TOTAL) BY MOUTH DAILY. 60 tablet 3  . tranexamic acid (LYSTEDA) 650 MG TABS tablet TAKE 2 TABLETS BY MOUTH 3 TIMES DAILY. 180 tablet 1   Facility-Administered Medications Prior to Visit  Medication Dose Route Frequency Provider Last Rate Last Dose  . heparin lock flush 100 unit/mL  500 Units Intracatheter Daily PRN Truitt Merle, MD      . sodium chloride flush (NS) 0.9 % injection 10 mL  10 mL Intracatheter PRN Truitt Merle, MD        Allergies  Allergen Reactions  . Statins Other (See Comments)    Liver enzymes go up whenever on them    ROS Review of Systems  Constitutional: Positive for fatigue. Negative for appetite change, chills and fever.  HENT: Negative for congestion, dental problem, ear pain and sore throat.   Eyes: Negative for discharge, redness and visual disturbance.  Respiratory: Positive for shortness of breath (with exertion/walking up inclines). Negative for cough, chest tightness and wheezing.  Cardiovascular: Negative for chest pain, palpitations and leg swelling.  Gastrointestinal: Negative for abdominal pain, blood in stool, diarrhea, nausea and vomiting.  Genitourinary: Negative for difficulty urinating, dysuria, flank pain, frequency, hematuria and urgency.  Musculoskeletal: Negative for arthralgias, back pain, joint swelling, myalgias and neck  stiffness.  Skin: Negative for pallor and rash.  Neurological: Negative for dizziness, speech difficulty, weakness and headaches.  Hematological: Negative for adenopathy. Does not bruise/bleed easily.  Psychiatric/Behavioral: Negative for confusion and sleep disturbance. The patient is not nervous/anxious.     PE; Blood pressure 131/66, pulse 61, temperature 98.2 F (36.8 C), temperature source Oral, resp. rate 16, height 5' 8.5" (1.74 m), weight 239 lb 6 oz (108.6 kg), SpO2 94 %. Body mass index is 35.87 kg/m.  Gen: Alert, well appearing.  Patient is oriented to person, place, time, and situation. AFFECT: pleasant, lucid thought and speech. ENT: Ears: EACs clear, normal epithelium.  TMs with good light reflex and landmarks bilaterally.  Eyes: no injection, icteris, swelling, or exudate.  EOMI, PERRLA. Nose: no drainage or turbinate edema/swelling.  No injection or focal lesion.  Mouth: lips without lesion/swelling.  Oral mucosa pink and moist.  Dentition intact and without obvious caries or gingival swelling.  Oropharynx without erythema, exudate, or swelling.  Neck: supple/nontender.  No LAD, mass, or TM.  Carotid pulses 2+ bilaterally, without bruits. CV: RRR, 2/6 systolic murmur, S1 obscured, S2 good, no diastolic murmur.  No r/g.   LUNGS: He has L basilar insp crackles with normal aeration, nonlabored resps, no wheezing or prolongation of exp phase. ABD: soft, NT, ND, BS normal.  No hepatospenomegaly or mass.  No bruits. EXT: no clubbing, cyanosis, or edema.  Musculoskeletal: no joint swelling, erythema, warmth, or tenderness.  ROM of all joints intact. Skin - no sores or suspicious lesions or rashes or color changes Rectal exam: negative without mass, lesions or tenderness, PROSTATE EXAM: smooth and symmetric without nodules or tenderness.   Pertinent labs:  Lab Results  Component Value Date   TSH 1.19 05/04/2016   Lab Results  Component Value Date   WBC 5.4 06/30/2017   HGB  8.5 (L) 06/30/2017   HCT 27.3 (L) 06/30/2017   MCV 88.3 06/30/2017   PLT 97 (L) 06/30/2017   Lab Results  Component Value Date   CREATININE 1.31 (H) 06/16/2017   BUN 12 06/16/2017   NA 136 06/16/2017   K 4.4 06/16/2017   CL 107 06/16/2017   CO2 24 06/16/2017   Lab Results  Component Value Date   ALT 26 06/16/2017   AST 33 06/16/2017   ALKPHOS 101 06/16/2017   BILITOT 4.4 (HH) 06/16/2017   Lab Results  Component Value Date   CHOL 162 01/25/2017   Lab Results  Component Value Date   HDL 47 01/25/2017   Lab Results  Component Value Date   LDLCALC 100 (H) 01/25/2017   Lab Results  Component Value Date   TRIG 76 01/25/2017   Lab Results  Component Value Date   CHOLHDL 3.4 01/25/2017   Lab Results  Component Value Date   PSA 0.31 06/29/2016     ASSESSMENT AND PLAN:    1) DOE, with L basilar insp crackles: could be atelectasis, but will check CXR--ordered today but pt unsure when he will be able to get this due to busy schedule of MD visits, etc. I suspect his DOE and fatigue are secondary to his chronic anemia. Keep f/u with Dr. Irene Limbo and his GI MD's.  2)  Health maintenance exam: Reviewed age and gender appropriate health maintenance issues (prudent diet, regular exercise, health risks of tobacco and excessive alcohol, use of seatbelts, fire alarms in home, use of sunscreen).  Also reviewed age and gender appropriate health screening as well as vaccine recommendations. Vaccines: UTD.  Pt to consider shingrix. Labs: as stated per HPI above/below. Prostate ca screening: DRE today, PSA ordered for future so he'll get this done with next lab draw at hem/onc. Colon ca screening: last colonoscopy 2018.  This will be monitored as per Dr. Ulyses Amor recommendations.  An After Visit Summary was printed and given to the patient.  FOLLOW UP:  Return in about 6 months (around 01/26/2018) for routine chronic illness f/u (30 min).  Signed:  Crissie Sickles, MD            07/27/2017

## 2017-07-28 ENCOUNTER — Ambulatory Visit (HOSPITAL_BASED_OUTPATIENT_CLINIC_OR_DEPARTMENT_OTHER)
Admission: RE | Admit: 2017-07-28 | Discharge: 2017-07-28 | Disposition: A | Discharge status: Home / Self Care | Payer: 59 | Source: Ambulatory Visit | Attending: Family Medicine | Admitting: Family Medicine

## 2017-07-28 ENCOUNTER — Telehealth: Payer: Self-pay

## 2017-07-28 ENCOUNTER — Other Ambulatory Visit: Payer: Self-pay | Admitting: Family Medicine

## 2017-07-28 ENCOUNTER — Telehealth: Payer: Self-pay | Admitting: Hematology

## 2017-07-28 ENCOUNTER — Inpatient Hospital Stay: Payer: 59

## 2017-07-28 ENCOUNTER — Encounter: Payer: Self-pay | Admitting: Hematology

## 2017-07-28 ENCOUNTER — Inpatient Hospital Stay (HOSPITAL_BASED_OUTPATIENT_CLINIC_OR_DEPARTMENT_OTHER): Payer: 59 | Admitting: Hematology

## 2017-07-28 ENCOUNTER — Inpatient Hospital Stay: Payer: 59 | Attending: Hematology

## 2017-07-28 VITALS — BP 111/58 | HR 61 | Temp 98.1°F | Resp 17 | Ht 68.5 in | Wt 239.1 lb

## 2017-07-28 VITALS — BP 112/66 | HR 55 | Temp 97.8°F | Resp 17

## 2017-07-28 DIAGNOSIS — K922 Gastrointestinal hemorrhage, unspecified: Secondary | ICD-10-CM | POA: Insufficient documentation

## 2017-07-28 DIAGNOSIS — D62 Acute posthemorrhagic anemia: Secondary | ICD-10-CM | POA: Diagnosis not present

## 2017-07-28 DIAGNOSIS — R918 Other nonspecific abnormal finding of lung field: Secondary | ICD-10-CM | POA: Diagnosis not present

## 2017-07-28 DIAGNOSIS — D500 Iron deficiency anemia secondary to blood loss (chronic): Secondary | ICD-10-CM | POA: Diagnosis not present

## 2017-07-28 DIAGNOSIS — R0602 Shortness of breath: Secondary | ICD-10-CM | POA: Diagnosis not present

## 2017-07-28 DIAGNOSIS — R0609 Other forms of dyspnea: Secondary | ICD-10-CM | POA: Insufficient documentation

## 2017-07-28 DIAGNOSIS — R0600 Dyspnea, unspecified: Secondary | ICD-10-CM

## 2017-07-28 DIAGNOSIS — Z125 Encounter for screening for malignant neoplasm of prostate: Secondary | ICD-10-CM | POA: Diagnosis not present

## 2017-07-28 LAB — CBC WITH DIFFERENTIAL/PLATELET
BASOS ABS: 0.1 10*3/uL (ref 0.0–0.1)
Basophils Relative: 1 %
Eosinophils Absolute: 0.7 10*3/uL — ABNORMAL HIGH (ref 0.0–0.5)
Eosinophils Relative: 13 %
HCT: 29.8 % — ABNORMAL LOW (ref 38.4–49.9)
Hemoglobin: 9.4 g/dL — ABNORMAL LOW (ref 13.0–17.1)
LYMPHS PCT: 11 %
Lymphs Abs: 0.6 10*3/uL — ABNORMAL LOW (ref 0.9–3.3)
MCH: 27.6 pg (ref 27.2–33.4)
MCHC: 31.5 g/dL — ABNORMAL LOW (ref 32.0–36.0)
MCV: 87.6 fL (ref 79.3–98.0)
MONO ABS: 1 10*3/uL — AB (ref 0.1–0.9)
Monocytes Relative: 17 %
NEUTROS ABS: 3.1 10*3/uL (ref 1.5–6.5)
Neutrophils Relative %: 58 %
Platelets: 91 10*3/uL — ABNORMAL LOW (ref 140–400)
RBC: 3.4 MIL/uL — ABNORMAL LOW (ref 4.20–5.82)
RDW: 18.6 % — AB (ref 11.0–14.6)
WBC: 5.5 10*3/uL (ref 4.0–10.3)

## 2017-07-28 LAB — RETICULOCYTES
RBC.: 3.4 MIL/uL — AB (ref 4.20–5.82)
RETIC COUNT ABSOLUTE: 88.4 10*3/uL (ref 34.8–93.9)
RETIC CT PCT: 2.6 % — AB (ref 0.8–1.8)

## 2017-07-28 LAB — CMP (CANCER CENTER ONLY)
ALT: 21 U/L (ref 0–55)
ANION GAP: 9 (ref 3–11)
AST: 36 U/L — ABNORMAL HIGH (ref 5–34)
Albumin: 3.1 g/dL — ABNORMAL LOW (ref 3.5–5.0)
Alkaline Phosphatase: 96 U/L (ref 40–150)
BILIRUBIN TOTAL: 4.5 mg/dL — AB (ref 0.2–1.2)
BUN: 12 mg/dL (ref 7–26)
CO2: 21 mmol/L — ABNORMAL LOW (ref 22–29)
Calcium: 8.5 mg/dL (ref 8.4–10.4)
Chloride: 106 mmol/L (ref 98–109)
Creatinine: 1.41 mg/dL — ABNORMAL HIGH (ref 0.70–1.30)
GFR, EST AFRICAN AMERICAN: 58 mL/min — AB (ref >60)
GFR, EST NON AFRICAN AMERICAN: 50 mL/min — AB (ref >60)
Glucose, Bld: 121 mg/dL (ref 70–140)
Potassium: 3.8 mmol/L (ref 3.5–5.1)
Sodium: 136 mmol/L (ref 136–145)
TOTAL PROTEIN: 6.1 g/dL — AB (ref 6.4–8.3)

## 2017-07-28 LAB — IRON AND TIBC
Iron: 26 ug/dL — ABNORMAL LOW (ref 42–163)
SATURATION RATIOS: 7 % — AB (ref 42–163)
TIBC: 361 ug/dL (ref 202–409)
UIBC: 335 ug/dL

## 2017-07-28 LAB — SAMPLE TO BLOOD BANK

## 2017-07-28 LAB — FERRITIN: Ferritin: 80 ng/mL (ref 22–316)

## 2017-07-28 MED ORDER — SODIUM CHLORIDE 0.9 % IV SOLN
750.0000 mg | Freq: Once | INTRAVENOUS | Status: AC
  Administered 2017-07-28: 750 mg via INTRAVENOUS
  Filled 2017-07-28: qty 15

## 2017-07-28 NOTE — Telephone Encounter (Signed)
Appointments scheduled Letter/Calendar mailed to patient per 6/21 los

## 2017-07-28 NOTE — Patient Instructions (Signed)
Ferric carboxymaltose injection What is this medicine? FERRIC CARBOXYMALTOSE (ferr-ik car-box-ee-mol-toes) is an iron complex. Iron is used to make healthy red blood cells, which carry oxygen and nutrients throughout the body. This medicine is used to treat anemia in people with chronic kidney disease or people who cannot take iron by mouth. This medicine may be used for other purposes; ask your health care provider or pharmacist if you have questions. COMMON BRAND NAME(S): Injectafer What should I tell my health care provider before I take this medicine? They need to know if you have any of these conditions: -anemia not caused by low iron levels -high levels of iron in the blood -liver disease -an unusual or allergic reaction to iron, other medicines, foods, dyes, or preservatives -pregnant or trying to get pregnant -breast-feeding How should I use this medicine? This medicine is for infusion into a vein. It is given by a health care professional in a hospital or clinic setting. Talk to your pediatrician regarding the use of this medicine in children. Special care may be needed. Overdosage: If you think you have taken too much of this medicine contact a poison control center or emergency room at once. NOTE: This medicine is only for you. Do not share this medicine with others. What if I miss a dose? It is important not to miss your dose. Call your doctor or health care professional if you are unable to keep an appointment. What may interact with this medicine? Do not take this medicine with any of the following medications: -deferoxamine -dimercaprol -other iron products This medicine may also interact with the following medications: -chloramphenicol -deferasirox This list may not describe all possible interactions. Give your health care provider a list of all the medicines, herbs, non-prescription drugs, or dietary supplements you use. Also tell them if you smoke, drink alcohol, or use  illegal drugs. Some items may interact with your medicine. What should I watch for while using this medicine? Visit your doctor or health care professional regularly. Tell your doctor if your symptoms do not start to get better or if they get worse. You may need blood work done while you are taking this medicine. You may need to follow a special diet. Talk to your doctor. Foods that contain iron include: whole grains/cereals, dried fruits, beans, or peas, leafy green vegetables, and organ meats (liver, kidney). What side effects may I notice from receiving this medicine? Side effects that you should report to your doctor or health care professional as soon as possible: -allergic reactions like skin rash, itching or hives, swelling of the face, lips, or tongue -breathing problems -changes in blood pressure -feeling faint or lightheaded, falls -flushing, sweating, or hot feelings Side effects that usually do not require medical attention (report to your doctor or health care professional if they continue or are bothersome): -changes in taste -constipation -dizziness -headache -nausea -pain, redness, or irritation at site where injected -vomiting This list may not describe all possible side effects. Call your doctor for medical advice about side effects. You may report side effects to FDA at 1-800-FDA-1088. Where should I keep my medicine? This drug is given in a hospital or clinic and will not be stored at home. NOTE: This sheet is a summary. It may not cover all possible information. If you have questions about this medicine, talk to your doctor, pharmacist, or health care provider.  2018 Elsevier/Gold Standard (2015-02-26 11:20:47)  

## 2017-07-28 NOTE — Telephone Encounter (Signed)
Call received from [facility] in the lab. Patient's bilirubin is 4.5. Dr. Irene Limbo made aware.

## 2017-07-29 ENCOUNTER — Encounter: Payer: Self-pay | Admitting: Family Medicine

## 2017-08-03 LAB — PSA: Prostate Specific Ag, Serum: 0.2 ng/mL (ref 0.0–4.0)

## 2017-08-07 ENCOUNTER — Telehealth: Payer: Self-pay | Admitting: Family Medicine

## 2017-08-07 NOTE — Telephone Encounter (Signed)
Copied from [place] [id number]. Topic: Quick Communication - Lab Results >> Jul 31, 2017 10:19 AM Ralph Dowdy, CMA wrote: Called patient to inform them of x-ray results. When patient returns call, triage nurse may disclose results. >> Aug 07, 2017  4:18 PM Ahmed Prima L wrote: Please call patient's wife to call [phone]

## 2017-08-07 NOTE — Telephone Encounter (Signed)
Copied from [place] [phone]. Topic: Quick Communication - Other Results >> Aug 07, 2017 11:03 AM Cecelia Byars, NT wrote:  patients wife called to get x ray results and would like a call back  at  [phone]

## 2017-08-07 NOTE — Telephone Encounter (Signed)
Phone call returned to pt's wife; advised of results of Chest xray per result notes of Dr. Anitra Lauth.    Notes recorded by Tammi Sou, MD on 07/29/2017 at 11:54 AM EDT Reassure pt that he has not acute abnormality on his chest x-ray. He has a mild amount of lung scarring in the bottoms of both lungs but this is not likely to cause any symptoms.-thx  Pt's wife verb. Understanding of results.  Denied any questions. (result note not available for the [employer], so telephone note was documented.)

## 2017-08-14 MED FILL — PANTOPRAZOLE SOD DR 40 MG T: 40 | 30 days supply | Qty: 60 | Fill #1

## 2017-08-14 MED FILL — POTASSIUM CL ER 20 MEQ TABL: 20 | 30 days supply | Qty: 60 | Fill #3

## 2017-08-14 MED FILL — TORSEMIDE 20 MG TABLET: 20 | 30 days supply | Qty: 60 | Fill #1

## 2017-08-21 MED FILL — METOPROLOL TARTRATE 25 MG T: 25 | 30 days supply | Qty: 30 | Fill #1

## 2017-08-21 NOTE — Progress Notes (Signed)
HEMATOLOGY/ONCOLOGY CLINIC NOTE  Date of Service: 08/21/17  Patient Care Team: Tammi Sou, MD as PCP - General (Family Medicine) Juanita Craver, MD as Consulting Physician (Gastroenterology) Franchot Gallo, MD as Consulting Physician (Urology) Troy Sine, MD as Consulting Physician (Cardiology) Brunetta Genera, MD as Consulting Physician (Hematology) [name], Gaspar Skeeters, MD as Consulting Physician (Gastroenterology)  CHIEF COMPLAINTS/PURPOSE OF CONSULTATION:   F/u for anemia and ongoing GI bleeding.  HISTORY OF PRESENTING ILLNESS:   William [name] is a wonderful 67 y.o. male who has been referred to [place] by Dr .Anitra Lauth, Adrian Blackwater, MD for evaluation and management of Anemia.  Patient has a history of extensive medical comorbidities including liver cirrhosis related to [facility] with portal hypertension, esophageal varices and portal hypertensive gastropathy and splenomegaly, iron deficiency anemia, obesity, diabetes, AAA.  Patient was admitted in April 2018 with acute blood loss anemia and required transfusion of multiple units of PRBCs with iron profile suggestive of iron deficiency. No overt evidence of hemolysis noted. He had an EGD and colonoscopy that did not show any overt bleeding. Capsule endoscopy was unrevealing but the capsule past and only 27 minutes.  He follows with Dr. [name] who is his gastric oncologist.  Patient was again readmitted in June 2018 with symptomatically anemia and hemoglobin of 6.3. He underwent 4 units of PRBC transfusions again and was sent out on PPI and iron supplementation with the plan to follow-up with Dr. [name].  He was also given hematology referral. He had a repeat ultrasound of the abdomen on 07/29/2016 which showed significant increase in splenomegaly in 2 months suggesting significant portal hypertension versus some element of splenic sequestration.  He continues to note intermittent black stools.  Hemoglobin is stable and  improved today and is up to 11.1. Patient notes he feels better after his transfusions. Has not noted lower GI bleeding at this time. We discuss any other workup to rule out less likely other possibilities of his anemia.  INTERVAL HISTORY  Paton is here for a scheduled follow-up of his anemia that is primarily related to ongoing issues with GI bleeding and has not been controllable by multiple GI interventions. The patient's last visit with [place] was on 07/28/17. He is accompanied today by his wife. The pt reports that he is doing well overall and noted a couple of episodes of black stools but overall much better than before.  Labs reviewed today. Off Sandostatin based on insurance disapproval of the medication.   MEDICAL HISTORY:  Past Medical History:  Diagnosis Date  . AAA (abdominal aortic aneurysm) ([place]) 03/2014   3.2 cm on MR abd.  F/u aortic u/s 06/2014 showed 3.0 x 3.1 cm AAA: recheck 2 yrs recommended (followed by cardiologist).  Minimal growth on 10/2016 CT abd done for epig pain.  Repeat u/s 10/2018.  [name] Anemia due to chronic blood loss 2018/19   GI: transfusions x >20 required; multiple endoscopies and bleeding scans unrevealing.  Hemolysis ruled out by hematologist.  Marshell Levan and octreotide + monthly iron infusions as of 04/2017.  . Bilateral renal cysts    simple (03/2014 MRI)  . CAD (coronary artery disease)   . Cholelithiases 2018   asymptomatic  . Chronic diastolic heart failure ([place])   . Cirrhosis ([place]) 05/2016   secondary to NASH; most recent u/s abd 07/2016 showed splenomegaly.  Hx of portal HTN changes with mild ascites and splenomegaly.  . Diabetes mellitus with complication ([place]) 07/3014   A1c 6.8%  .  History of blood transfusion 2018 X 4 dates   "low blood" (09/21/2016)  . Hyperlipemia, mixed    elevated LFTs when on statins.    . Hypertension    Cr bump 04/01/16 so I changed benicar-hct to benicar plain and added amlodipine 5 mg.  . Iron deficiency anemia 05/2016    Acute blood loss anemia: hospitalized, required transfusion x 3 U: colonoscopy and capsule study unrevealing.  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  W/u for ? hemolytic anemia to be pursued by hematologist as outpt.  Dr. [name], GI at [facility] following, too---he rec'd onc do bone marrow bx as of Jan 2019  . Lung field abnormal finding on examination    Bibasilar L>R mild insp crackles-->x-ray showed mild interstitial changes/fibrosis/scarring.  Changes noted on all CXRs in 2018/2019.  . Microscopic hematuria    Eval unremarkable by Dr. Eulogio Ditch.  [name] NASH (nonalcoholic steatohepatitis)    Fatty liver on MR abd 03/2014, + hx of elevated transaminases and bili: followed by Dr. [name].  . Obesity   . Spleen enlarged     SURGICAL HISTORY: Past Surgical History:  Procedure Laterality Date  . CARDIAC CATHETERIZATION    . CARDIOVASCULAR STRESS TEST  01/17/2012; 05/04/16   Normal stress nuclear study x 2 (2018--Normal perfusion. LVEF 66% with normal wall motion. This is a low risk study).  . CERVICAL DISCECTOMY  1992  . COLONOSCOPY N/A 05/27/2016   No site/explanation for blood loss found.  Erythematous mucosa in cecum and ascending colon--Cecal bx: normal.  Procedure: COLONOSCOPY;  Surgeon: Carol Ada, MD;  Location: [facility] ENDOSCOPY;  Service: Endoscopy;  Laterality: N/A;  . COLONOSCOPY W/ POLYPECTOMY  09/10/2014   Polypectomy x 2: recall 5 yrs (Dr. [name]).  . CORONARY ANGIOPLASTY    . CORONARY ARTERY BYPASS GRAFT  03/2006  . ENTEROSCOPY N/A 07/31/2016   Procedure: ENTEROSCOPY;  Surgeon: Ladene Artist, MD;  Location: [facility] ENDOSCOPY;  Service: Endoscopy;  Laterality: N/A;  . ENTEROSCOPY N/A 12/02/2016   Procedure: ENTEROSCOPY;  Surgeon: Carol Ada, MD;  Location: [facility] ENDOSCOPY;  Service: Endoscopy;  Laterality: N/A;  . ESOPHAGOGASTRODUODENOSCOPY N/A 05/25/2016   No site/explanation for blood loss found.   Procedure: ESOPHAGOGASTRODUODENOSCOPY (EGD);  Surgeon: Juanita Craver, MD;  Location: [facility] ENDOSCOPY;  Service: Endoscopy;  Laterality: N/A;  . ESOPHAGOGASTRODUODENOSCOPY N/A 09/23/2016   Procedure: ESOPHAGOGASTRODUODENOSCOPY (EGD);  Surgeon: Carol Ada, MD;  Location: [place];  Service: Endoscopy;  Laterality: N/A;  . GIVENS CAPSULE STUDY N/A 05/27/2016   No source identified.  Repeat 10/2016--results pending.  Procedure: GIVENS CAPSULE STUDY;  Surgeon: Carol Ada, MD;  Location: [facility] ENDOSCOPY;  Service: Endoscopy;  Laterality: N/A;  . GIVENS CAPSULE STUDY N/A 10/15/2016   Procedure: GIVENS CAPSULE STUDY;  Surgeon: Carol Ada, MD;  Location: [facility] ENDOSCOPY;  Service: Endoscopy;  Laterality: N/A;  . GIVENS CAPSULE STUDY N/A 02/13/2017   Procedure: GIVENS CAPSULE STUDY;  Surgeon: Carol Ada, MD;  Location: [place];  Service: Endoscopy;  Laterality: N/A;  . NECK SURGERY  1991  . NM GI BLOOD LOSS  01/27/2017   NEGATIVE  . SMALL BOWEL ENTEROSCOPY  10/2016   ([facility], Dr. [name]: Double balloon enteroscopy--to the level of proximal ileum) jejunal polyps- which were removed--not bleeding & benign.  No source of bleeding was identified.  . TRANSTHORACIC ECHOCARDIOGRAM  05/25/2016   EF 60%, normal wall motion, grd II DD, mild aortic stenosis, dilated aortic root and ascending  aorta.  [name] VASECTOMY  1977    SOCIAL HISTORY: Social History   Socioeconomic History  . Marital status: Married    Spouse name: Not on file  . Number of children: Not on file  . Years of education: Not on file  . Highest education level: Not on file  Occupational History  . Not on file  Social Needs  . Financial resource strain: Not on file  . Food insecurity:    Worry: Not on file    Inability: Not on file  . Transportation needs:    Medical: Not on file    Non-medical: Not on file  Tobacco Use  . Smoking status: Former Smoker    Types: Cigarettes  . Smokeless tobacco: Never Used  . Tobacco comment: QUIT I  1983  Substance and Sexual Activity  . Alcohol use: No  . Drug use: No  . Sexual activity: Yes  Lifestyle  . Physical activity:    Days per week: 0 days    Minutes per session: 0 min  . Stress: Not on file  Relationships  . Social connections:    Talks on phone: Not on file    Gets together: Not on file    Attends religious service: Not on file    Active member of club or organization: Not on file    Attends meetings of clubs or organizations: Not on file    Relationship status: Not on file  . Intimate partner violence:    Fear of current or ex partner: Not on file    Emotionally abused: Not on file    Physically abused: Not on file    Forced sexual activity: Not on file  Other Topics Concern  . Not on file  Social History Narrative   Married, 3 grown children, 3 GCs.   Educ: 10th grade.   Occupation: Retired Brewing technologist.   Tob: quit 1983, smoked about 20 pack-yr hx prior.   No alcohol.    FAMILY HISTORY: Family History  Problem Relation Age of [place]  . Hypertension Mother   . Cancer - Other Mother        liver cancer  . Heart disease Father   . Heart attack Father   . Cancer - Lung Father   . Diabetes Father   . Liver disease Sister   . Anemia Sister   . Heart disease Sister   . Heart attack Sister   . Heart disease Brother        CABG 20 YEARS AGO  . Hypertension Brother   . Mesothelioma Brother        HALF-BROTHER  . Cancer - Other Brother        CLL  . Cancer - Lung Brother        Mets to brain  . Cancer - Lung Sister        HALF-SISTER  . Liver disease Brother   . Heart disease Brother        CABG-2012  . Emphysema Maternal Grandfather   . Cancer - Other Paternal Grandmother        Stomach  . Heart attack Paternal Grandfather     ALLERGIES:  is allergic to statins.  MEDICATIONS:  Current Outpatient Medications  Medication Sig Dispense Refill  . acetaminophen (TYLENOL) 325 MG tablet Take 325 mg by mouth every 6 (six) hours as needed for  fever (for fever/pain.).     [name] cholecalciferol (VITAMIN D) 1000 units tablet Take 1,000 Units by  mouth daily.    . fluticasone (FLONASE) 50 MCG/ACT nasal spray Place 2 sprays into both nostrils daily. 16 g 6  . lactulose (CHRONULAC) 10 GM/15ML solution Take 45 mLs (30 g total) by mouth 3 (three) times daily. Take this until you have three BM's a day. 240 mL 0  . metoprolol tartrate (LOPRESSOR) 25 MG tablet TAKE 1/2 TABLET (12.5 MG TOTAL) BY MOUTH TWICE A DAY 30 tablet 5  . Octreotide Acetate (SANDOSTATIN IJ) Inject 1 Dose as directed every 30 (thirty) days.    . ondansetron (ZOFRAN) 4 MG tablet Take 1 tablet (4 mg total) by mouth every 8 (eight) hours as needed for nausea or vomiting. 10 tablet 1  . pantoprazole (PROTONIX) 40 MG tablet TAKE 1 TABLET BY MOUTH TWICE DAILY BEFORE A MEAL 60 tablet 3  . potassium chloride SA (K-DUR,KLOR-CON) 20 MEQ tablet TAKE 1 TABLET (20 MEQ TOTAL) BY MOUTH TWICE A DAY 60 tablet 6  . spironolactone (ALDACTONE) 25 MG tablet Take 12.5 mg by mouth daily.    [name] torsemide (DEMADEX) 20 MG tablet TAKE 2 TABLETS (40 MG TOTAL) BY MOUTH DAILY. 60 tablet 3  . tranexamic acid (LYSTEDA) 650 MG TABS tablet TAKE 2 TABLETS BY MOUTH 3 TIMES DAILY. 180 tablet 1   No current facility-administered medications for this visit.    Facility-Administered Medications Ordered in Other Visits  Medication Dose Route Frequency Provider Last Rate Last Dose  . heparin lock flush 100 unit/mL  500 Units Intracatheter Daily PRN Truitt Merle, MD      . sodium chloride flush (NS) 0.9 % injection 10 mL  10 mL Intracatheter PRN Truitt Merle, MD        REVIEW OF SYSTEMS:    A 10+ POINT REVIEW OF SYSTEMS WAS OBTAINED including neurology, dermatology, psychiatry, cardiac, respiratory, lymph, extremities, GI, GU, Musculoskeletal, constitutional, breasts, reproductive, HEENT.  All pertinent positives are noted in the HPI.  All others are negative.   PHYSICAL EXAMINATION:   ECOG PERFORMANCE STATUS: 1 -  Symptomatic but completely ambulatory  VS reivewed in Epic - stable. [name] GENERAL:alert, in no acute distress and comfortable SKIN: no acute rashes, no significant lesions EYES: conjunctiva are pink and non-injected, sclera anicteric OROPHARYNX: MMM, no exudates, no oropharyngeal erythema or ulceration NECK: supple, no JVD LYMPH:  no palpable lymphadenopathy in the cervical, axillary or inguinal regions LUNGS: clear to auscultation b/l with normal respiratory effort HEART: regular rate & rhythm ABDOMEN:  normoactive bowel sounds , non tender, not distended. Extremity: trace pedal edema PSYCH: alert & oriented x 3 with fluent speech NEURO: no focal motor/sensory deficits    LABORATORY DATA:  I have reviewed the data as listed  . CBC Latest Ref Rng & Units 08/28/2017 07/28/2017 06/30/2017  WBC 4.0 - 10.3 K/uL 6.0 5.5 5.4  Hemoglobin 13.0 - 17.1 g/dL 10.2(L) 9.4(L) 8.5(L)  Hematocrit 38.4 - 49.9 % 31.9(L) 29.8(L) 27.3(L)  Platelets 140 - 400 K/uL 95(L) 91(L) 97(L)    CMP Latest Ref Rng & Units 07/28/2017 06/16/2017 06/02/2017  Glucose 70 - 140 mg/dL 121 108 109  BUN 7 - 26 mg/dL 12 12 17   Creatinine 0.70 - 1.30 mg/dL 1.41(H) 1.31(H) 1.56(H)  Sodium 136 - 145 mmol/L 136 136 136  Potassium 3.5 - 5.1 mmol/L 3.8 4.4 4.2  Chloride 98 - 109 mmol/L 106 107 106  CO2 22 - 29 mmol/L 21(L) 24 22  Calcium 8.4 - 10.4 mg/dL 8.5 8.6 8.8  Total Protein 6.4 - 8.3 g/dL 6.1(L) 6.1(L)  6.5  Total Bilirubin 0.2 - 1.2 mg/dL 4.5(HH) 4.4(HH) 4.0(HH)  Alkaline Phos 40 - 150 U/L 96 101 96  AST 5 - 34 U/L 36(H) 33 28  ALT 0 - 55 U/L 21 26 22     Lab Results  Component Value Date   FERRITIN 80 07/28/2017   . RADIOGRAPHIC STUDIES: I have personally reviewed the radiological images as listed and agreed with the findings in the report. Dg Chest 2 View  Result Date: 07/28/2017 CLINICAL DATA:  Chronic shortness of breath. Crackles in LEFT lower lobe. Former smoker. EXAM: CHEST - 2 VIEW COMPARISON:  Chest  x-rays dated 11/01/2016 and 05/22/2016. FINDINGS: Heart size and mediastinal contours are within normal limits. Median sternotomy wires appear intact and stable in alignment. Coarse lung markings noted bilaterally, bibasilar, suggesting chronic interstitial lung disease/fibrosis. No new opacity to suggest a developing pneumonia. No pleural effusion or pneumothorax seen. No acute or suspicious osseous finding. IMPRESSION: 1. No active cardiopulmonary disease. No evidence of pneumonia or pulmonary edema. 2. Probable bibasilar chronic interstitial lung disease/fibrosis. Electronically Signed   By: Franki Cabot M.D.   On: 07/28/2017 13:23    ASSESSMENT & PLAN:   67 y.o. male with multiple medical comorbidities with Karlene Lineman with liver cirrhosis with portal hypertension, esophageal varices and portal hypertensive gastropathy with ongoing chronic GI bleeding that has not been   #1 Chronic blood loss anemia.  Patient has had recurrent GI bleeding requiring > 20 units of PRBCs. since April 2018 and continues to have ongoing GI bleeding.  Previous workup showed LDH within normal limits at 220 and suggests against overt hemolysis. Sedimentation rate within normal limits. Coombs' test is negative. Myeloma panel and serum free light chains suggest no monoclonal paraproteinemia. PNH testing negative Slightly lower haptoglobin levels can be related to decreased hepatic production, low-level hemolysis due to transfusions or extravascular hemolysis due to splenic or hepatic sequestration.  small bowel endoscopy done on 12/02/2016: impression - The examined portion of the jejunum was normal.  - Normal examined duodenum. - Portal hypertensive gastropathy.  -Small (< 5 mm) esophageal varices. - No specimens collected.  Capsule Endoscopy on 02/15/17: No evidence of any bleeding during this examination. There was the possibility of an atypical AVM manifested as an erythematous patch. No evidence of any ulcerations,  erosions, masses, or polyps.  Recent rbc tagged study unrevealing. Recent labs on 05/05/2017 showed: Retic Ct Pct at 3.2, RBC at 3.34, and Hgb at 9.4.  PLAN:  -Insurance has disapproved Sandostatin injection-now discontinued (this has helped keep him out of the hospital but achieve some control over the speed of his GI bleeding and prevent recurrent hospitalizations. This was based on limited case reports of use with occult GI bleeding uncontrolled - possibly from AVMs. Recommended by his GI MD from [facility]). -Will continue monthly IV Injectafer unless ferritin progressively increase to >500 suggesting GI bleeding has stopped -overall his PRBC transfusion requirements have decreased with a combination of sandostatin + IV iron + lysteda -still having ongoing sub clinical GI losses as witnessed by  decreasing ferritin levels needing ongoing IV iron replacement. -he has had previous extensive GI evaluation with no overt source evidence of controllable GIB at this time. -Continue on lysteda 1327m po 3x daily given ongoing GI bleeding. -continue f/u with GI  All of the patients questions were answered with apparent satisfaction. The patient knows to call the clinic with any problems, questions or concerns.   . The total time spent in the appointment was  25 minutes and more than 50% was on counseling and direct patient cares.   Sullivan Lone MD [place] AAHIVMS [facility] Hematology/Oncology Physician [facility]  (Office):       [phone] (Work cell):  [phone] (Fax):           [phone]

## 2017-08-28 ENCOUNTER — Inpatient Hospital Stay: Payer: 59 | Attending: Hematology

## 2017-08-28 ENCOUNTER — Inpatient Hospital Stay: Payer: 59

## 2017-08-28 ENCOUNTER — Inpatient Hospital Stay (HOSPITAL_BASED_OUTPATIENT_CLINIC_OR_DEPARTMENT_OTHER): Payer: 59 | Admitting: Hematology

## 2017-08-28 VITALS — BP 112/65 | HR 59 | Temp 98.2°F | Resp 17

## 2017-08-28 DIAGNOSIS — D649 Anemia, unspecified: Secondary | ICD-10-CM

## 2017-08-28 DIAGNOSIS — K922 Gastrointestinal hemorrhage, unspecified: Secondary | ICD-10-CM | POA: Diagnosis not present

## 2017-08-28 DIAGNOSIS — Z87891 Personal history of nicotine dependence: Secondary | ICD-10-CM | POA: Diagnosis not present

## 2017-08-28 DIAGNOSIS — D500 Iron deficiency anemia secondary to blood loss (chronic): Secondary | ICD-10-CM | POA: Insufficient documentation

## 2017-08-28 DIAGNOSIS — D696 Thrombocytopenia, unspecified: Secondary | ICD-10-CM

## 2017-08-28 LAB — RETICULOCYTES
RBC.: 3.62 MIL/uL — ABNORMAL LOW (ref 4.20–5.82)
RETIC COUNT ABSOLUTE: 112.2 10*3/uL — AB (ref 34.8–93.9)
RETIC CT PCT: 3.1 % — AB (ref 0.8–1.8)

## 2017-08-28 LAB — CBC WITH DIFFERENTIAL (CANCER CENTER ONLY)
BASOS ABS: 0.1 10*3/uL (ref 0.0–0.1)
BASOS PCT: 2 %
EOS ABS: 0.7 10*3/uL — AB (ref 0.0–0.5)
EOS PCT: 11 %
HCT: 31.9 % — ABNORMAL LOW (ref 38.4–49.9)
Hemoglobin: 10.2 g/dL — ABNORMAL LOW (ref 13.0–17.1)
Lymphocytes Relative: 17 %
Lymphs Abs: 1 10*3/uL (ref 0.9–3.3)
MCH: 28.2 pg (ref 27.2–33.4)
MCHC: 32 g/dL (ref 32.0–36.0)
MCV: 88.1 fL (ref 79.3–98.0)
Monocytes Absolute: 1.1 10*3/uL — ABNORMAL HIGH (ref 0.1–0.9)
Monocytes Relative: 19 %
Neutro Abs: 3.1 10*3/uL (ref 1.5–6.5)
Neutrophils Relative %: 51 %
PLATELETS: 95 10*3/uL — AB (ref 140–400)
RBC: 3.62 MIL/uL — AB (ref 4.20–5.82)
RDW: 19 % — ABNORMAL HIGH (ref 11.0–14.6)
WBC: 6 10*3/uL (ref 4.0–10.3)

## 2017-08-28 LAB — IRON AND TIBC
IRON: 30 ug/dL — AB (ref 42–163)
Saturation Ratios: 8 % — ABNORMAL LOW (ref 42–163)
TIBC: 373 ug/dL (ref 202–409)
UIBC: 342 ug/dL

## 2017-08-28 LAB — FERRITIN: Ferritin: 68 ng/mL (ref 24–336)

## 2017-08-28 MED ORDER — FERRIC CARBOXYMALTOSE 750 MG/15ML IV SOLN
750.0000 mg | Freq: Once | INTRAVENOUS | Status: AC
  Administered 2017-08-28: 750 mg via INTRAVENOUS
  Filled 2017-08-28: qty 15

## 2017-08-28 NOTE — Patient Instructions (Signed)
Ferric carboxymaltose injection What is this medicine? FERRIC CARBOXYMALTOSE (ferr-ik car-box-ee-mol-toes) is an iron complex. Iron is used to make healthy red blood cells, which carry oxygen and nutrients throughout the body. This medicine is used to treat anemia in people with chronic kidney disease or people who cannot take iron by mouth. This medicine may be used for other purposes; ask your health care provider or pharmacist if you have questions. COMMON BRAND NAME(S): Injectafer What should I tell my health care provider before I take this medicine? They need to know if you have any of these conditions: -anemia not caused by low iron levels -high levels of iron in the blood -liver disease -an unusual or allergic reaction to iron, other medicines, foods, dyes, or preservatives -pregnant or trying to get pregnant -breast-feeding How should I use this medicine? This medicine is for infusion into a vein. It is given by a health care professional in a hospital or clinic setting. Talk to your pediatrician regarding the use of this medicine in children. Special care may be needed. Overdosage: If you think you have taken too much of this medicine contact a poison control center or emergency room at once. NOTE: This medicine is only for you. Do not share this medicine with others. What if I miss a dose? It is important not to miss your dose. Call your doctor or health care professional if you are unable to keep an appointment. What may interact with this medicine? Do not take this medicine with any of the following medications: -deferoxamine -dimercaprol -other iron products This medicine may also interact with the following medications: -chloramphenicol -deferasirox This list may not describe all possible interactions. Give your health care provider a list of all the medicines, herbs, non-prescription drugs, or dietary supplements you use. Also tell them if you smoke, drink alcohol, or use  illegal drugs. Some items may interact with your medicine. What should I watch for while using this medicine? Visit your doctor or health care professional regularly. Tell your doctor if your symptoms do not start to get better or if they get worse. You may need blood work done while you are taking this medicine. You may need to follow a special diet. Talk to your doctor. Foods that contain iron include: whole grains/cereals, dried fruits, beans, or peas, leafy green vegetables, and organ meats (liver, kidney). What side effects may I notice from receiving this medicine? Side effects that you should report to your doctor or health care professional as soon as possible: -allergic reactions like skin rash, itching or hives, swelling of the face, lips, or tongue -breathing problems -changes in blood pressure -feeling faint or lightheaded, falls -flushing, sweating, or hot feelings Side effects that usually do not require medical attention (report to your doctor or health care professional if they continue or are bothersome): -changes in taste -constipation -dizziness -headache -nausea -pain, redness, or irritation at site where injected -vomiting This list may not describe all possible side effects. Call your doctor for medical advice about side effects. You may report side effects to FDA at 1-800-FDA-1088. Where should I keep my medicine? This drug is given in a hospital or clinic and will not be stored at home. NOTE: This sheet is a summary. It may not cover all possible information. If you have questions about this medicine, talk to your doctor, pharmacist, or health care provider.  2018 Elsevier/Gold Standard (2015-02-26 11:20:47)  

## 2017-08-30 ENCOUNTER — Telehealth: Payer: Self-pay | Admitting: Hematology

## 2017-08-30 NOTE — Telephone Encounter (Signed)
Left message for patient regarding upcoming appt updates. Mailed patient calendar of upcoming sept appts.

## 2017-08-31 MED FILL — HYDROCODON-APAP 5-325: 5-325 | 3 days supply | Qty: 10 | Fill #0

## 2017-09-05 ENCOUNTER — Other Ambulatory Visit: Payer: Self-pay | Admitting: Hematology

## 2017-09-06 MED FILL — TRANEXAMIC ACID 650 MG TAB: 650 | 30 days supply | Qty: 180 | Fill #0

## 2017-09-11 ENCOUNTER — Telehealth: Payer: Self-pay | Admitting: Cardiovascular Disease

## 2017-09-11 DIAGNOSIS — K7290 Hepatic failure, unspecified without coma: Secondary | ICD-10-CM | POA: Diagnosis not present

## 2017-09-11 DIAGNOSIS — D509 Iron deficiency anemia, unspecified: Secondary | ICD-10-CM | POA: Diagnosis not present

## 2017-09-11 NOTE — Telephone Encounter (Signed)
Received records from [facility] on 09/11/17, Appt 10/05/17 @ 2:20PM

## 2017-09-13 MED FILL — PANTOPRAZOLE SOD DR 40 MG T: 40 | 30 days supply | Qty: 60 | Fill #2

## 2017-09-13 MED FILL — POTASSIUM CL ER 20 MEQ TAB: 20 | 30 days supply | Qty: 60 | Fill #4

## 2017-09-13 MED FILL — TORSEMIDE 20 MG TABLET: 20 | 30 days supply | Qty: 60 | Fill #2

## 2017-09-14 MED FILL — METOPROLOL TARTRATE 25 MG T: 25 | 30 days supply | Qty: 30 | Fill #2

## 2017-09-28 ENCOUNTER — Inpatient Hospital Stay: Payer: 59 | Attending: Hematology

## 2017-09-28 ENCOUNTER — Inpatient Hospital Stay: Payer: 59

## 2017-09-28 ENCOUNTER — Telehealth: Payer: Self-pay

## 2017-09-28 VITALS — BP 111/61 | HR 67 | Temp 97.7°F | Resp 18

## 2017-09-28 DIAGNOSIS — D500 Iron deficiency anemia secondary to blood loss (chronic): Secondary | ICD-10-CM

## 2017-09-28 DIAGNOSIS — K922 Gastrointestinal hemorrhage, unspecified: Secondary | ICD-10-CM | POA: Insufficient documentation

## 2017-09-28 DIAGNOSIS — D649 Anemia, unspecified: Secondary | ICD-10-CM

## 2017-09-28 LAB — CMP (CANCER CENTER ONLY)
ALT: 30 U/L (ref 0–44)
AST: 38 U/L (ref 15–41)
Albumin: 3 g/dL — ABNORMAL LOW (ref 3.5–5.0)
Alkaline Phosphatase: 113 U/L (ref 38–126)
Anion gap: 7 (ref 5–15)
BUN: 10 mg/dL (ref 8–23)
CHLORIDE: 106 mmol/L (ref 98–111)
CO2: 23 mmol/L (ref 22–32)
Calcium: 8.4 mg/dL — ABNORMAL LOW (ref 8.9–10.3)
Creatinine: 1.28 mg/dL — ABNORMAL HIGH (ref 0.61–1.24)
GFR, EST NON AFRICAN AMERICAN: 56 mL/min — AB (ref >60)
GFR, Est AFR Am: >60 mL/min (ref >60)
Glucose, Bld: 102 mg/dL — ABNORMAL HIGH (ref 70–99)
Potassium: 3.8 mmol/L (ref 3.5–5.1)
Sodium: 136 mmol/L (ref 135–145)
Total Bilirubin: 4.8 mg/dL (ref 0.3–1.2)
Total Protein: 6.2 g/dL — ABNORMAL LOW (ref 6.5–8.1)

## 2017-09-28 LAB — CBC WITH DIFFERENTIAL/PLATELET
Basophils Absolute: 0.1 10*3/uL (ref 0.0–0.1)
Basophils Relative: 1 %
Eosinophils Absolute: 0.7 10*3/uL — ABNORMAL HIGH (ref 0.0–0.5)
Eosinophils Relative: 15 %
HEMATOCRIT: 30.9 % — AB (ref 38.4–49.9)
Hemoglobin: 10 g/dL — ABNORMAL LOW (ref 13.0–17.1)
LYMPHS ABS: 0.8 10*3/uL — AB (ref 0.9–3.3)
Lymphocytes Relative: 16 %
MCH: 28.7 pg (ref 27.2–33.4)
MCHC: 32.4 g/dL (ref 32.0–36.0)
MCV: 88.8 fL (ref 79.3–98.0)
Monocytes Absolute: 0.8 10*3/uL (ref 0.1–0.9)
Monocytes Relative: 17 %
NEUTROS ABS: 2.5 10*3/uL (ref 1.5–6.5)
Neutrophils Relative %: 51 %
Platelets: 85 10*3/uL — ABNORMAL LOW (ref 140–400)
RBC: 3.48 MIL/uL — AB (ref 4.20–5.82)
RDW: 19 % — ABNORMAL HIGH (ref 11.0–14.6)
WBC: 4.9 10*3/uL (ref 4.0–10.3)

## 2017-09-28 LAB — SAMPLE TO BLOOD BANK

## 2017-09-28 LAB — IRON AND TIBC
IRON: 36 ug/dL — AB (ref 42–163)
Saturation Ratios: 10 % — ABNORMAL LOW (ref 42–163)
TIBC: 358 ug/dL (ref 202–409)
UIBC: 322 ug/dL

## 2017-09-28 LAB — FERRITIN: Ferritin: 84 ng/mL (ref 24–336)

## 2017-09-28 MED ORDER — SODIUM CHLORIDE 0.9 % IV SOLN
750.0000 mg | Freq: Once | INTRAVENOUS | Status: AC
  Administered 2017-09-28: 750 mg via INTRAVENOUS
  Filled 2017-09-28: qty 15

## 2017-09-28 NOTE — Progress Notes (Signed)
Pt declined to stay for 30 minute post-observation

## 2017-09-28 NOTE — Telephone Encounter (Signed)
Panic bilirubin call received from the lab. Dr. Irene Limbo made aware and reviewed all lab results. No new orders received.

## 2017-09-28 NOTE — Patient Instructions (Signed)
Ferric carboxymaltose injection What is this medicine? FERRIC CARBOXYMALTOSE (ferr-ik car-box-ee-mol-toes) is an iron complex. Iron is used to make healthy red blood cells, which carry oxygen and nutrients throughout the body. This medicine is used to treat anemia in people with chronic kidney disease or people who cannot take iron by mouth. This medicine may be used for other purposes; ask your health care provider or pharmacist if you have questions. COMMON BRAND NAME(S): Injectafer What should I tell my health care provider before I take this medicine? They need to know if you have any of these conditions: -anemia not caused by low iron levels -high levels of iron in the blood -liver disease -an unusual or allergic reaction to iron, other medicines, foods, dyes, or preservatives -pregnant or trying to get pregnant -breast-feeding How should I use this medicine? This medicine is for infusion into a vein. It is given by a health care professional in a hospital or clinic setting. Talk to your pediatrician regarding the use of this medicine in children. Special care may be needed. Overdosage: If you think you have taken too much of this medicine contact a poison control center or emergency room at once. NOTE: This medicine is only for you. Do not share this medicine with others. What if I miss a dose? It is important not to miss your dose. Call your doctor or health care professional if you are unable to keep an appointment. What may interact with this medicine? Do not take this medicine with any of the following medications: -deferoxamine -dimercaprol -other iron products This medicine may also interact with the following medications: -chloramphenicol -deferasirox This list may not describe all possible interactions. Give your health care provider a list of all the medicines, herbs, non-prescription drugs, or dietary supplements you use. Also tell them if you smoke, drink alcohol, or use  illegal drugs. Some items may interact with your medicine. What should I watch for while using this medicine? Visit your doctor or health care professional regularly. Tell your doctor if your symptoms do not start to get better or if they get worse. You may need blood work done while you are taking this medicine. You may need to follow a special diet. Talk to your doctor. Foods that contain iron include: whole grains/cereals, dried fruits, beans, or peas, leafy green vegetables, and organ meats (liver, kidney). What side effects may I notice from receiving this medicine? Side effects that you should report to your doctor or health care professional as soon as possible: -allergic reactions like skin rash, itching or hives, swelling of the face, lips, or tongue -breathing problems -changes in blood pressure -feeling faint or lightheaded, falls -flushing, sweating, or hot feelings Side effects that usually do not require medical attention (report to your doctor or health care professional if they continue or are bothersome): -changes in taste -constipation -dizziness -headache -nausea -pain, redness, or irritation at site where injected -vomiting This list may not describe all possible side effects. Call your doctor for medical advice about side effects. You may report side effects to FDA at 1-800-FDA-1088. Where should I keep my medicine? This drug is given in a hospital or clinic and will not be stored at home. NOTE: This sheet is a summary. It may not cover all possible information. If you have questions about this medicine, talk to your doctor, pharmacist, or health care provider.  2018 Elsevier/Gold Standard (2015-02-26 11:20:47)  

## 2017-10-02 MED FILL — SPIRONOLACTONE 25 MG TABLET: 25 | 30 days supply | Qty: 30 | Fill #1

## 2017-10-02 MED FILL — GENERLAC 10 GM/15 ML SOLN: 10 | 30 days supply | Qty: 2700 | Fill #3

## 2017-10-05 ENCOUNTER — Ambulatory Visit: Payer: 59 | Admitting: Cardiovascular Disease

## 2017-10-05 ENCOUNTER — Encounter: Payer: Self-pay | Admitting: Cardiovascular Disease

## 2017-10-05 VITALS — BP 126/62 | HR 71 | Ht 68.5 in | Wt 235.2 lb

## 2017-10-05 DIAGNOSIS — D500 Iron deficiency anemia secondary to blood loss (chronic): Secondary | ICD-10-CM

## 2017-10-05 DIAGNOSIS — K7460 Unspecified cirrhosis of liver: Secondary | ICD-10-CM

## 2017-10-05 DIAGNOSIS — I2510 Atherosclerotic heart disease of native coronary artery without angina pectoris: Secondary | ICD-10-CM

## 2017-10-05 DIAGNOSIS — I10 Essential (primary) hypertension: Secondary | ICD-10-CM

## 2017-10-05 DIAGNOSIS — Z951 Presence of aortocoronary bypass graft: Secondary | ICD-10-CM | POA: Diagnosis not present

## 2017-10-05 DIAGNOSIS — I350 Nonrheumatic aortic (valve) stenosis: Secondary | ICD-10-CM | POA: Diagnosis not present

## 2017-10-05 NOTE — Progress Notes (Signed)
Patient ID: William [name], male   DOB: 11/19/1950, 67 y.o.   MRN: [id number]    HPI: William [name] is a 67 y.o. male who presents to the office today for a 11-monthcardiology evaluation.  William [name] known CAD and underwent CABG revascularization surgery with a LIMA to the LAD, SVG to the RCA, SVG to the circumflex vessel in February 2008.  He has a history of obesity, metabolic syndrome, and mixed hyperlipidemia with preponderance of small dense LDL some particles and increased TG for which he has been on combination therapy.  In December 2013 a nuclear perfusion study was unchanged from 2 years previously and did not show significant scar or ischemia. In January 2014,  I adjusted his antihypertensive medications and started  Benicar HCT 40/25 for optimal blood pressure control with increase diuretic regimen and took him off his Avalide. He has felt improved on this therapy.  According to his wife, his blood pressure at home typically is in the 120s systolially and in the 731Ddiastolically.  He has a history of moderate obesity.  He does not routinely exercise.  He also does note some dietary discretion. He has noticed some mild leg swelling intermittently. He denies recent anginal symptoms. He denies palpitations.   In 2016 he underwent a urologic evaluation for gross hematuria. A CT of his abdomen and pelvis did not reveal any evidence for renal calculi or hydronephrosis or evidence for renal calculi or dilatation. He did have small benign renal cysts. He was also noted a 3.5 cm infrarenal abdominal aortic aneurysm. There was also a suggestion of fatty infiltration of his liver.  He underwent a follow-up abdominal aortic ultrasound on 06/27/2014.  This showed his distal aorta measuring 3.0 x 3.1 cm and he had mild amount of atherosclerosis.  He underwent a  colonoscopy by Dr. JTyrone Sageand was found to have 2 benign polyps.  He has been off  Crestor and niacin since last year due to elevation of  his liver enzymes. There is family history for liver disease in that his mother died from liver disease and his brother had a liver transplant.  In July 2016, his AST was 51 and ALT was 54.  Subsequent blood work by Dr. MCollene Maresrevealed his AST increased at 87 and ALT 68.  On 01/15/2015, his AST was 85 and ALT 64.  At that time, total cholesterol was 244, triglycerides 149, HDL 30, and his LDL cholesterol on statin therapy had risen to 184.   WhenI last saw him he denied chest tightness.  He was not been successful with weight loss and in fact has gained some weight.  Labs checked by his primary M.D.showed a hemoglobin A1c was 6.6.  LFTs had slightly improved but AST was still elevated at 71 and his ALT was now normal.   He underwent a nuclear perfusion study in March 2018.  This showed normal perfusion.  Ejection fraction was 66% with normal wall motion.  The test was felt to be low risk.  He underwent an echo Doppler study in April 2012 which showed an EF of 60-65% with mild LVH.  There was grade 2 diastolic dysfunction and increased filling pressures.  There was mild aortic stenosis with a mean gradient of 23 and peak gradient of 36, mild aortic root dilatation, mild biatrial enlargement, and mild TR.  He has had recurrent admissions for anemia and GI blood loss.  He has had undergone evaluations by Dr. MCollene [name] and  Star[place] and was planning to see William [name]  at [facility] for further GI evaluation and possible procedures.  He has been intolerant to statin therapy secondary to LFT elevation.    He went to [facility] and had small bowel evaluation.  He has continued to have recurrent GI bleeds.  He has required packed red blood cell transfusions.  2 weeks ago.  His hemoglobin was 9 and hematocrit 28.9, and yesterday hemoglobin was 7.5 with hematocrit of 24.1.  He is scheduled to have another red blood cell transfusion on 01/14/2017.  He has also been undergoing IV iron therapy with his hematologist, Dr. Irene Limbo.  He denies any  episodes of chest pain.  His liver transaminases have been normal, but his bilirubin has increased in the 4 range.   Since I saw him, he has now been diagnosed with cirrhosis.  He is getting intravenous iron is no longer on octreotide due to insurance.  He needs to be followed by Dr. Sonnie Alamo for primary care.  Denies episodes of chest tightness.  He denies presyncope or syncope.  In the past he was on Repatha but this was stopped secondary to ammonia level increase and he was concerned that this may have been contributory.  Midst to fatigue and shortness of breath with activity.  He presents for evaluation.  Past Medical History:  Diagnosis Date  . AAA (abdominal aortic aneurysm) ([place]) 03/2014   3.2 cm on MR abd.  F/u aortic u/s 06/2014 showed 3.0 x 3.1 cm AAA: recheck 2 yrs recommended (followed by cardiologist).  Minimal growth on 10/2016 CT abd done for epig pain.  Repeat u/s 10/2018.  [name] Anemia due to chronic blood loss 2018/19   GI: transfusions x >20 required; multiple endoscopies and bleeding scans unrevealing.  Hemolysis ruled out by hematologist.  William [name] and octreotide + monthly iron infusions as of 04/2017.  . Bilateral renal cysts    simple (03/2014 MRI)  . CAD (coronary artery disease)   . Cholelithiases 2018   asymptomatic  . Chronic diastolic heart failure ([place])   . Cirrhosis ([place]) 05/2016   secondary to NASH; most recent u/s abd 07/2016 showed splenomegaly.  Hx of portal HTN changes with mild ascites and splenomegaly.  . Diabetes mellitus with complication ([place]) 85/4627   A1c 6.8%  . History of blood transfusion 2018 X 4 dates   "low blood" (09/21/2016)  . Hyperlipemia, mixed    elevated LFTs when on statins.    . Hypertension    Cr bump 04/01/16 so I changed benicar-hct to benicar plain and added amlodipine 5 mg.  . Iron deficiency anemia 05/2016   Acute blood loss anemia: hospitalized, required transfusion x 3 U: colonoscopy and capsule study unrevealing.  Readmitted  6/22-6/25, 2018 for symptomatic anemia again, got transfused x 4U, EGD with grd I esoph varices and port hyt gastropathy.  W/u for ? hemolytic anemia to be pursued by hematologist as outpt.  William [name], GI at [facility] following, too---he rec'd onc do bone marrow bx as of Jan 2019  . Lung field abnormal finding on examination    Bibasilar L>R mild insp crackles-->x-ray showed mild interstitial changes/fibrosis/scarring.  Changes noted on all CXRs in 2018/2019.  . Microscopic hematuria    Eval unremarkable by Dr. Eulogio Ditch.  [name] NASH (nonalcoholic steatohepatitis)    Fatty liver on MR abd 03/2014, + hx of elevated transaminases and bili: followed by Dr. Collene [name].  . Obesity   . Spleen enlarged     Past Surgical  History:  Procedure Laterality Date  . CARDIAC CATHETERIZATION    . CARDIOVASCULAR STRESS TEST  01/17/2012; 05/04/16   Normal stress nuclear study x 2 (2018--Normal perfusion. LVEF 66% with normal wall motion. This is a low risk study).  . CERVICAL DISCECTOMY  1992  . COLONOSCOPY N/A 05/27/2016   No site/explanation for blood loss found.  Erythematous mucosa in cecum and ascending colon--Cecal bx: normal.  Procedure: COLONOSCOPY;  Surgeon: Carol Ada, MD;  Location: [facility] ENDOSCOPY;  Service: Endoscopy;  Laterality: N/A;  . COLONOSCOPY W/ POLYPECTOMY  09/10/2014   Polypectomy x 2: recall 5 yrs (Dr. Collene [name]).  . CORONARY ANGIOPLASTY    . CORONARY ARTERY BYPASS GRAFT  03/2006  . ENTEROSCOPY N/A 07/31/2016   Procedure: ENTEROSCOPY;  Surgeon: Ladene Artist, MD;  Location: [facility] ENDOSCOPY;  Service: Endoscopy;  Laterality: N/A;  . ENTEROSCOPY N/A 12/02/2016   Procedure: ENTEROSCOPY;  Surgeon: Carol Ada, MD;  Location: [facility] ENDOSCOPY;  Service: Endoscopy;  Laterality: N/A;  . ESOPHAGOGASTRODUODENOSCOPY N/A 05/25/2016   No site/explanation for blood loss found.  Procedure: ESOPHAGOGASTRODUODENOSCOPY (EGD);  Surgeon: Juanita Craver, MD;  Location: [facility] ENDOSCOPY;  Service: Endoscopy;  Laterality: N/A;  .  ESOPHAGOGASTRODUODENOSCOPY N/A 09/23/2016   Procedure: ESOPHAGOGASTRODUODENOSCOPY (EGD);  Surgeon: Carol Ada, MD;  Location: [facility];  Service: Endoscopy;  Laterality: N/A;  . GIVENS CAPSULE STUDY N/A 05/27/2016   No source identified.  Repeat 10/2016--results pending.  Procedure: GIVENS CAPSULE STUDY;  Surgeon: Carol Ada, MD;  Location: [facility] ENDOSCOPY;  Service: Endoscopy;  Laterality: N/A;  . GIVENS CAPSULE STUDY N/A 10/15/2016   Procedure: GIVENS CAPSULE STUDY;  Surgeon: Carol Ada, MD;  Location: [facility] ENDOSCOPY;  Service: Endoscopy;  Laterality: N/A;  . GIVENS CAPSULE STUDY N/A 02/13/2017   Procedure: GIVENS CAPSULE STUDY;  Surgeon: Carol Ada, MD;  Location: [place];  Service: Endoscopy;  Laterality: N/A;  . NECK SURGERY  1991  . NM GI BLOOD LOSS  01/27/2017   NEGATIVE  . SMALL BOWEL ENTEROSCOPY  10/2016   ([facility], William [name]: Double balloon enteroscopy--to the level of proximal ileum) jejunal polyps- which were removed--not bleeding & benign.  No source of bleeding was identified.  . TRANSTHORACIC ECHOCARDIOGRAM  05/25/2016   EF 60%, normal wall motion, grd II DD, mild aortic stenosis, dilated aortic root and ascending aorta.  [name] VASECTOMY  1977    Allergies  Allergen Reactions  . Statins Other (See Comments)    Liver enzymes go up whenever on them    Current Outpatient Medications  Medication Sig Dispense Refill  . acetaminophen (TYLENOL) 325 MG tablet Take 325 mg by mouth every 6 (six) hours as needed for fever (for fever/pain.).     [name] cholecalciferol (VITAMIN D) 1000 units tablet Take 1,000 Units by mouth daily.    . fluticasone (FLONASE) 50 MCG/ACT nasal spray Place 2 sprays into both nostrils daily. 16 g 6  . lactulose (CHRONULAC) 10 GM/15ML solution Take 45 mLs (30 g total) by mouth 3 (three) times daily. Take this until you have three BM's a day. 240 mL 0  . metoprolol tartrate (LOPRESSOR) 25 MG tablet TAKE 1/2 TABLET (12.5 MG TOTAL) BY MOUTH TWICE A DAY 30 tablet 5    . ondansetron (ZOFRAN) 4 MG tablet Take 1 tablet (4 mg total) by mouth every 8 (eight) hours as needed for nausea or vomiting. 10 tablet 1  . pantoprazole (PROTONIX) 40 MG tablet TAKE 1 TABLET BY MOUTH TWICE DAILY BEFORE A MEAL 60 tablet 3  . potassium chloride  SA (K-DUR,KLOR-CON) 20 MEQ tablet TAKE 1 TABLET (20 MEQ TOTAL) BY MOUTH TWICE A DAY 60 tablet 6  . spironolactone (ALDACTONE) 25 MG tablet Take 12.5 mg by mouth daily.    [name] torsemide (DEMADEX) 20 MG tablet TAKE 2 TABLETS (40 MG TOTAL) BY MOUTH DAILY. 60 tablet 3  . tranexamic acid (LYSTEDA) 650 MG TABS tablet TAKE 2 TABLETS BY MOUTH 3 TIMES DAILY. 180 tablet 1   No current facility-administered medications for this visit.    Facility-Administered Medications Ordered in Other Visits  Medication Dose Route Frequency Provider Last Rate Last Dose  . heparin lock flush 100 unit/mL  500 Units Intracatheter Daily PRN Truitt Merle, MD      . sodium chloride flush (NS) 0.9 % injection 10 mL  10 mL Intracatheter PRN Truitt Merle, MD        Socially he's married has 3 children 3 grandchildren. Does not routinely exercise. In the past he had been walking 4 miles on a treadmill but has not done this in some time. There is no tobacco or alcohol use.  ROS General: Negative; No fevers, chills, or night sweats;  HEENT: Negative; No changes in vision or hearing, sinus congestion, difficulty swallowing Pulmonary: Negative; No cough, wheezing, shortness of breath, hemoptysis Cardiovascular: Negative; No chest pain, presyncope, syncope, palpitations Positive for Increasing leg swelling GI: Positive for polyps on colonoscopy; positive for fatty liver; recent GI bleeding and symptomatic anemia, cirrhosis GU: Negative; No dysuria, hematuria, or difficulty voiding Musculoskeletal: Negative; no myalgias, joint pain, or weakness Hematologic/Oncology: Negative; no easy bruising, bleeding Endocrine: Positive for metabolic syndrome/mild diabetes; no heat/cold  intolerance; Neuro: Negative; no changes in balance, headaches Skin: Negative; No rashes or skin lesions Psychiatric: Negative; No behavioral problems, depression Sleep: Negative; No snoring, daytime sleepiness, hypersomnolence, bruxism, restless legs, hypnogognic hallucinations, no cataplexy Other comprehensive 14 point system review is negative.   PE BP 126/62   Pulse 71   Ht 5' 8.5" (1.74 m)   Wt 235 lb 3.2 oz (106.7 kg)   BMI 35.24 kg/m    BP by me 130/70  Wt Readings from Last 3 Encounters:  10/05/17 235 lb 3.2 oz (106.7 kg)  07/28/17 239 lb 1.6 oz (108.5 kg)  07/27/17 239 lb 6 oz (108.6 kg)   General: Alert, oriented, no distress.  Skin: normal turgor, no rashes, warm and dry; no telangiectasias. HEENT: Normocephalic, atraumatic. Pupils equal round and reactive to light; sclera anicteric; extraocular muscles intact;  Nose without nasal septal hypertrophy Mouth/Parynx benign; Mallinpatti scale 3 Neck: No JVD, no carotid bruits; normal carotid upstroke Lungs: clear to ausculatation and percussion; no wheezing or rales Chest wall: without tenderness to palpitation Heart: PMI not displaced, RRR, s1 s2 normal, 2/6 systolic murmur in the aortic area, no diastolic murmur, no rubs, gallops, thrills, or heaves Abdomen: soft, nontender; no hepatosplenomehaly, BS+; abdominal aorta nontender and not dilated by palpation. Back: no CVA tenderness Pulses 2+ Musculoskeletal: full range of motion, normal strength, no joint deformities Extremities: no clubbing cyanosis or edema, Homan's sign negative  Neurologic: grossly nonfocal; Cranial nerves grossly wnl Psychologic: Normal mood and affect   ECG (independently read by me): NSR at 71; IRBBB, QS complex V1-08 January 2017 ECG (independently read by me): Normal sinus rhythm at 79 bpm.  Incomplete right bundle branch block.  LVH by voltage.  Nonspecific ST changes.  QTc interval 509 ms.  August 2018 ECG (independently read by me):  Normal sinus rhythm at 74 bpm.  Incomplete right  bundle branch block.  First-degree AV block with a PR interval 28 ms.  QRS complex V1 V2.  QTc interval 499 ms.  March 2018 ECG (independently read by me): Normal sinus rhythm at 61 bpm.  First degree AV block with a PR interval at 224 ms.  Incomplete right bundle branch block.  December 2017 ECG (independently read by me): Normal sinus rhythm at 63 bpm with first-degree AV block.  PR interval 222 ms.  Incomplete right bundle branch block.  June 2017 ECG (independently read by me): Normal sinus rhythm at 65 bpm with first-degree AV block with a PR interval of 214 ms.  Mild RV conduction delay.  Poor anterior R-wave progression V1 through V4.  December 2016 ECG (independently read by me): Normal sinus rhythm at 66 bpm.  First-degree AV block with a PR interval at 224 ms.  June 2016 ECG (independently read by me): Normal sinus rhythm at 66. First-degree block with PR interval 28 ms.  Incomplete right bundle branch block.  QRS complex V1-3.   October 2015 ECG (independently read by me): Sinus rhythm with first-degree AV block.  Incomplete right bundle branch block.  QTc interval 447 ms; first-degree AV block with a PR interval of 220 ms  03/19/2013 ECG (independently read by me): Normal sinus rhythm at 69 beats per minute. No ectopy. Borderline first-degree block with a PR interval of 216 ms.  Prior ECG of July 18,2014:  Sinus rhythm at 57 beats per minute. Borderline first-degree block with a period of 1214 ms. No significant ST abnormalities.  LABS: BMP Latest Ref Rng & Units 09/28/2017 07/28/2017 06/16/2017  Glucose 70 - 99 mg/dL 102(H) 121 108  BUN 8 - 23 mg/dL _0 Creatinine 0.61 - 1.24 mg/dL 1.28(H) 1.41(H) 1.31(H)  BUN/Creat Ratio 10 - 24 - - -  Sodium 135 - 145 mmol/L 136 136 136  Potassium 3.5 - 5.1 mmol/L 3.8 3.8 4.4  Chloride 98 - 111 mmol/L 106 106 107  CO2 22 - 32 mmol/L 23 21(L) 24  Calcium 8.9 - 10.3 mg/dL 8.4(L) 8.5 8.6    Hepatic Function Latest Ref Rng & Units 09/28/2017 07/28/2017 06/16/2017  Total Protein 6.5 - 8.1 g/dL 6.2(L) 6.1(L) 6.1(L)  Albumin 3.5 - 5.0 g/dL 3.0(L) 3.1(L) 3.0(L)  AST 15 - 41 U/L 38 36(H) 33  ALT 0 - 44 U/L _1 Alk Phosphatase 38 - 126 U/L 113 96 101  Total Bilirubin 0.3 - 1.2 mg/dL 4.8(HH) 4.5(HH) 4.4(HH)  Bilirubin, Direct 0.1 - 0.5 mg/dL - - -   CBC Latest Ref Rng & Units 09/28/2017 08/28/2017 07/28/2017  WBC 4.0 - 10.3 K/uL 4.9 6.0 5.5  Hemoglobin 13.0 - 17.1 g/dL 10.0(L) 10.2(L) 9.4(L)  Hematocrit 38.4 - 49.9 % 30.9(L) 31.9(L) 29.8(L)  Platelets 140 - 400 K/uL 85(L) 95(L) 91(L)   Lab Results  Component Value Date   MCV 88.8 09/28/2017   MCV 88.1 08/28/2017   MCV 87.6 07/28/2017   Lab Results  Component Value Date   TSH 1.19 05/04/2016   Lab Results  Component Value Date   HGBA1C 4.6 04/05/2017   Lipid Panel     Component Value Date/Time   CHOL 162 01/25/2017 0000   CHOL 135 11/29/2013 0940   TRIG 76 01/25/2017 0000   TRIG 177 (H) 11/29/2013 0940   HDL 47 01/25/2017 0000   HDL 34 (L) 11/29/2013 0940   CHOLHDL 3.4 01/25/2017 0000   CHOLHDL 4 09/29/2016 1118   VLDL 13.8  09/29/2016 1118   LDLCALC 100 (H) 01/25/2017 0000   LDLCALC 66 11/29/2013 0940     RADIOLOGY: No results found.  IMPRESSION:  1. Coronary artery disease involving native coronary artery of native heart without angina pectoris   2. Aortic stenosis, mild   3. Hx of CABG   4. Essential hypertension   5. Cirrhosis of liver without ascites, unspecified hepatic cirrhosis type ([place])   6. Iron deficiency anemia due to chronic blood loss     ASSESSMENT AND PLAN: Mr. Zulauf is a 67 year old gentleman with a history of moderate obesity, who underwent CABG revascularization surgery x4 in February 2008 . His nuclear perfusion study in December 2013 showed fairly normal perfusion without scar or ischemia. A five-year follow-up study in March 2018 continued to be low risk demonstrating normal  perfusion and function.. He has significant mixed hyperlipidemia and in the past has been documented to have a preponderance of small dense LDL particles and significantly increased triglycerides. Remotely he initially tolerated combination therapy with Niaspan 2000 mg and Crestor 20 mg without side effects. He developed  LFT elevation and his statin and niacin were discontinued.  Subsequent LFT studies have shown improvement but with residual mild  transaminase elevation.  He also has had issues with LFT elevation with pravastatin and Lipitor in addition to Crestor.  He has had issues with elevation of bilirubin.  He is now felt to have cirrhosis.  He had been on Repatha for PCSK9 inhibition with benefit of his LDL but during this time his serum ammonia level had risen and there was concern perhaps this was related and this was discontinued.  He used to be anemic and is undergoing monthly intravenous iron injections.  He has had issues with insurance regarding additional therapy with octreatide.  Admits to some shortness of breath and fatigue.  He undergo a repeat echo Doppler study to reassess his aortic valve and his last echo Doppler study in April 2018 had shown an EF of 60 to 65% mild calcified aortic valve leaflets and mild stenosis mean gradient of 23, peak gradient of 36, and estimated valve area 1.7 cm.  To have mild aortic root dilatation at that time and there was mild biatrial enlargement with mild TR.  PA pressures were normal.  He is continually followed by GI by Dr. Irene Limbo for hematology .  I will see him in 4 months for reevaluation.  Time spent: 25 minutes  Troy Sine, MD, [facility]  10/07/2017 2:49 PM

## 2017-10-05 NOTE — Patient Instructions (Signed)
Medication Instructions:  Your physician recommends that you continue on your current medications as directed. Please refer to the Current Medication list given to you today.  Testing/Procedures: Your physician has requested that you have an echocardiogram. Echocardiography is a painless test that uses sound waves to create images of your heart. It provides your doctor with information about the size and shape of your heart and how well your heart's chambers and valves are working. This procedure takes approximately one hour. There are no restrictions for this procedure.  This will be done at our [facility] location:  [address]  Follow-Up: 4 months with Dr. Claiborne Billings  Any Other Special Instructions Will Be Listed Below (If Applicable).     If you need a refill on your cardiac medications before your next appointment, please call your pharmacy.

## 2017-10-07 ENCOUNTER — Encounter: Payer: Self-pay | Admitting: Cardiovascular Disease

## 2017-10-11 ENCOUNTER — Ambulatory Visit (HOSPITAL_COMMUNITY): Payer: 59 | Attending: Cardiovascular Disease

## 2017-10-11 DIAGNOSIS — E785 Hyperlipidemia, unspecified: Secondary | ICD-10-CM | POA: Insufficient documentation

## 2017-10-11 DIAGNOSIS — I2510 Atherosclerotic heart disease of native coronary artery without angina pectoris: Secondary | ICD-10-CM | POA: Diagnosis not present

## 2017-10-11 DIAGNOSIS — I350 Nonrheumatic aortic (valve) stenosis: Secondary | ICD-10-CM | POA: Diagnosis not present

## 2017-10-11 DIAGNOSIS — D649 Anemia, unspecified: Secondary | ICD-10-CM | POA: Insufficient documentation

## 2017-10-11 DIAGNOSIS — E119 Type 2 diabetes mellitus without complications: Secondary | ICD-10-CM | POA: Insufficient documentation

## 2017-10-11 DIAGNOSIS — Z951 Presence of aortocoronary bypass graft: Secondary | ICD-10-CM | POA: Insufficient documentation

## 2017-10-11 DIAGNOSIS — Z6835 Body mass index (BMI) 35.0-35.9, adult: Secondary | ICD-10-CM | POA: Diagnosis not present

## 2017-10-11 DIAGNOSIS — I10 Essential (primary) hypertension: Secondary | ICD-10-CM | POA: Insufficient documentation

## 2017-10-11 DIAGNOSIS — I088 Other rheumatic multiple valve diseases: Secondary | ICD-10-CM | POA: Diagnosis not present

## 2017-10-11 DIAGNOSIS — E669 Obesity, unspecified: Secondary | ICD-10-CM | POA: Diagnosis not present

## 2017-10-11 DIAGNOSIS — I714 Abdominal aortic aneurysm, without rupture: Secondary | ICD-10-CM | POA: Insufficient documentation

## 2017-10-13 MED FILL — TORSEMIDE 20 MG TABLET: 20 | 30 days supply | Qty: 60 | Fill #3

## 2017-10-13 MED FILL — TRANEXAMIC ACID 650 MG TAB: 650 | 30 days supply | Qty: 180 | Fill #1

## 2017-10-13 MED FILL — PANTOPRAZOLE SOD DR 40 MG T: 40 | 30 days supply | Qty: 60 | Fill #3

## 2017-10-13 MED FILL — METOPROLOL TARTRATE 25 MG T: 25 | 30 days supply | Qty: 30 | Fill #3

## 2017-10-13 MED FILL — ONDANSETRON HCL 4 MG TABLET: 4 | 4 days supply | Qty: 10 | Fill #1

## 2017-10-13 MED FILL — POTASSIUM CL ER 20 MEQ TAB: 20 | 30 days supply | Qty: 60 | Fill #5

## 2017-10-16 IMAGING — US US ABDOMEN COMPLETE
1 series · 13 of 25 positions shown · non-contrast
Comparison: Ultrasound, 05/24/2016

CLINICAL DATA: Anemia for 1 day.  History of thrombocytopenia.

EXAM:
ABDOMEN ULTRASOUND COMPLETE

[Series 1: us abdomen complete · 0.30mm/px · 13 of 96 slices shown]
[im 1/96]
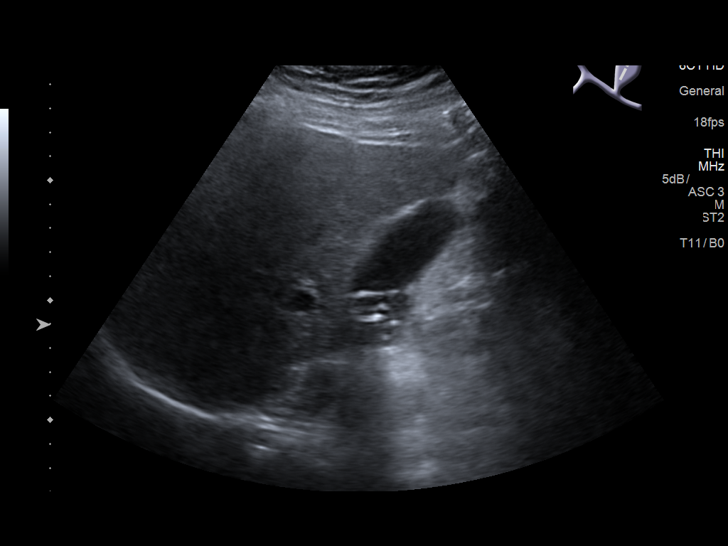
[im 8/96]
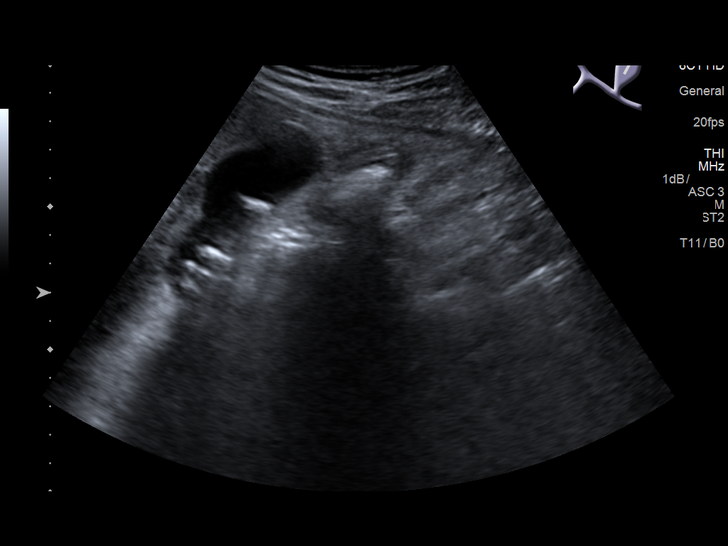
[im 16/96]
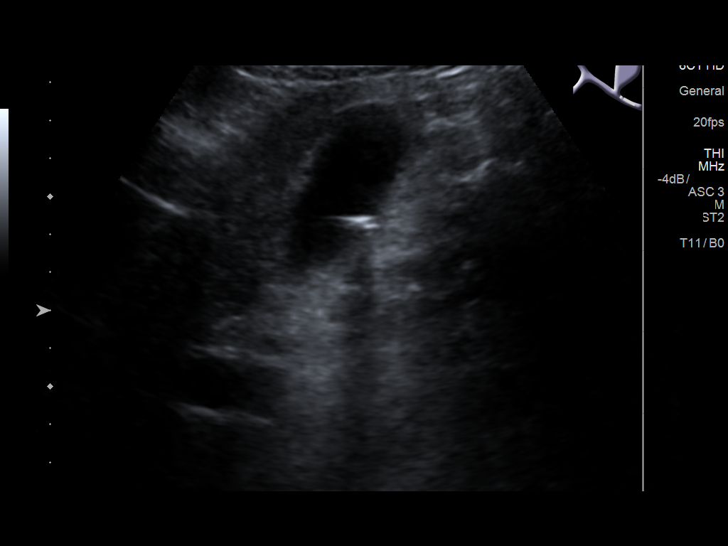
[im 24/96]
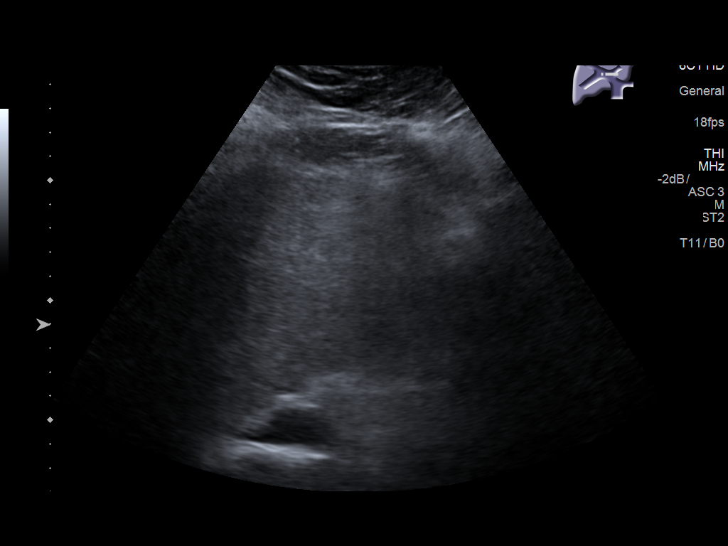
[im 32/96]
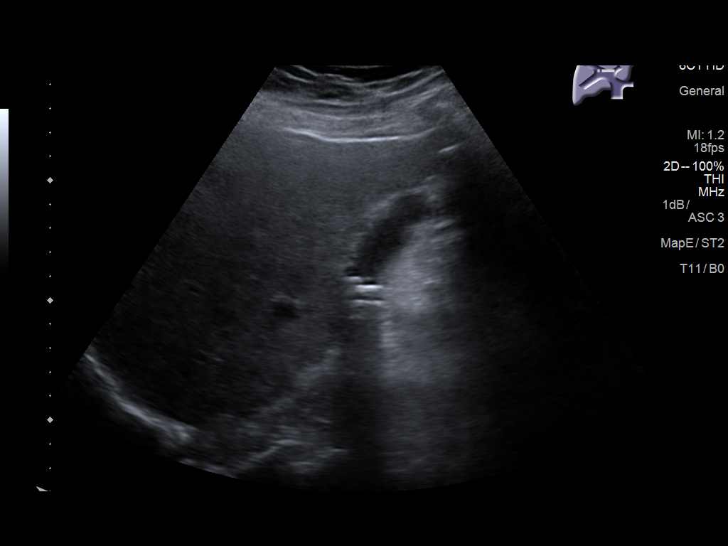
[im 40/96]
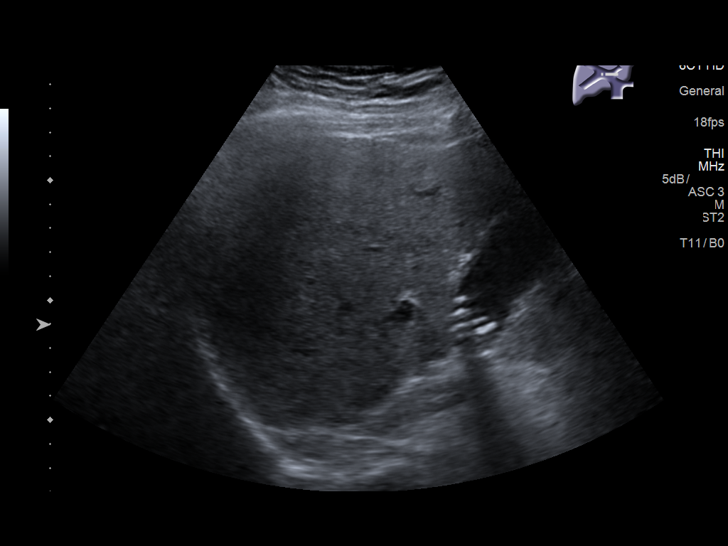
[im 48/96]
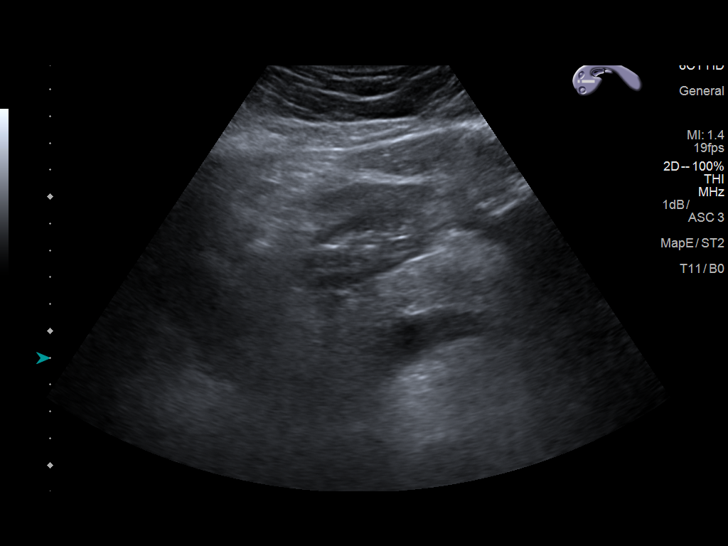
[im 56/96]
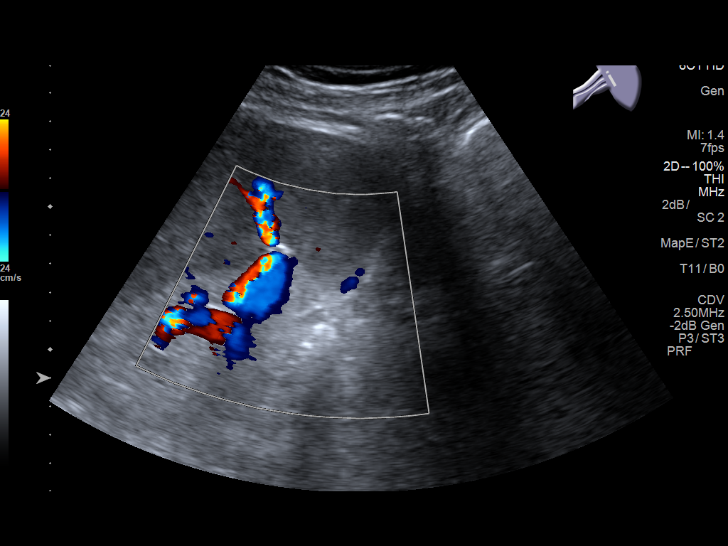
[im 64/96]
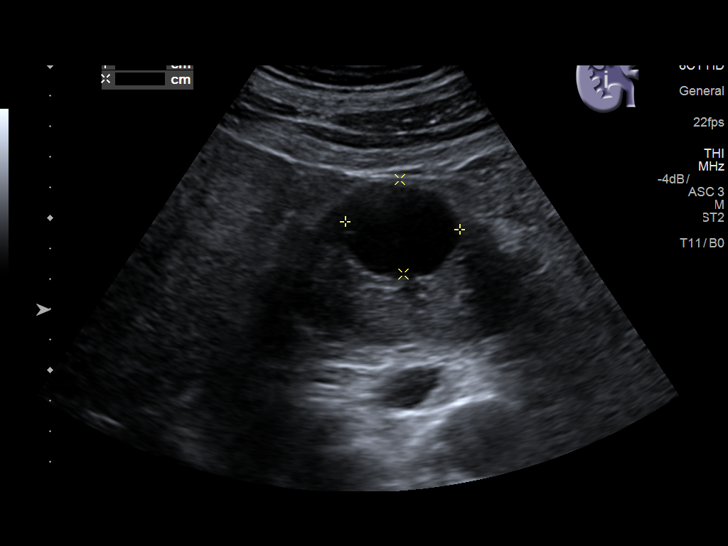
[im 72/96]
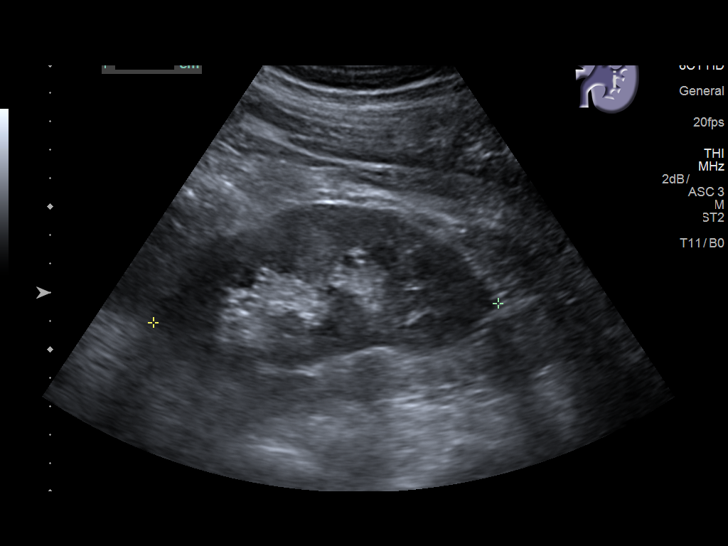
[im 80/96]
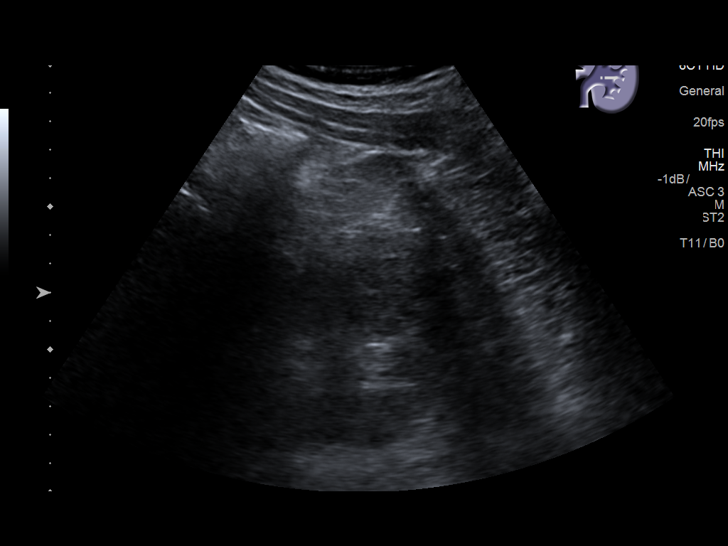
[im 88/96]
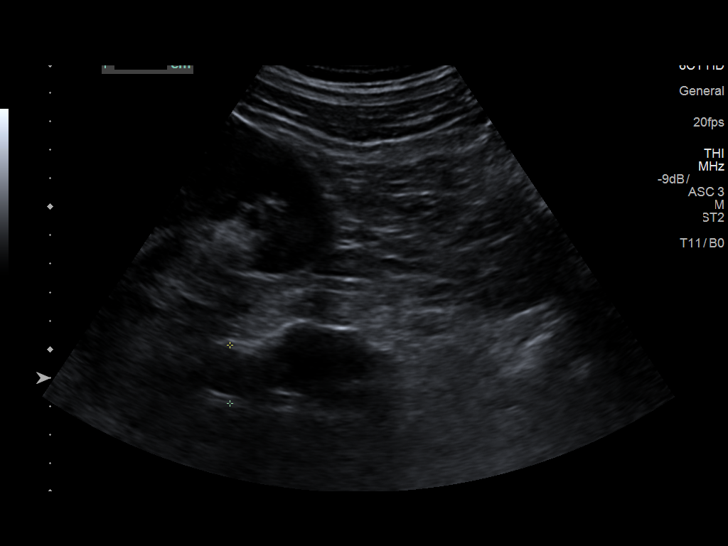
[im 96/96]
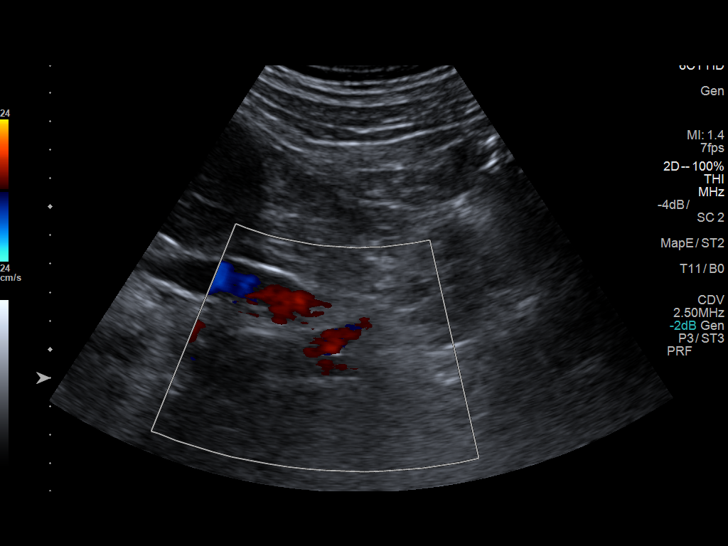

[13 of 25 positions shown; findings below may reference images not displayed]

FINDINGS: Gallbladder: Multiple gallstones, mobile and dependent. Largest
measures 1 cm. No wall thickening or pericholecystic fluid.

Common bile duct: Diameter: 4.3 mm

Liver: Coarsened echogenicity with no discrete mass or focal lesion.
Nodular contour. Hepatopetal flow documented in the portal vein.

IVC: No abnormality visualized.

Pancreas: Visualized portion unremarkable.

Spleen: Enlarged measuring 12.6 x 15.4 x 8.9 cm, volume calculated
at 902 mL. Spleen measures larger than on the prior study where the
volume was calculated at 494 mL. No discrete splenic mass or focal
lesion.

Right Kidney: Length: 12.3 cm. Normal parenchymal echogenicity.
Lateral midpole cyst measuring 3.8 x 3.1 x 3.0 cm. This is stable.
No other right renal masses, no stones and no hydronephrosis.

Left Kidney: Length: 12.1 cm. Normal parenchymal echogenicity.
cm midpole cyst. 1.4 cm lower pole cyst. No other masses, no stones
and no hydronephrosis.

Abdominal aorta: Dilated to 3.0 x 3.1 cm. Atherosclerotic
calcifications noted along the wall. This aneurysm was not evident
on the prior ultrasound, presumably due to bowel gas obscuring the
lower abdominal aorta.

Other findings: None.
IMPRESSION: 1. No acute findings.
2. Splenomegaly. The spleen has increased in size the prior study
increasing from 494 mL to 902 mL.
3. Liver appearance suggests cirrhosis similar to the prior exam. No
liver mass or focal lesion. Hepatopetal flow in the portal vein.
4. Renal cysts.
5. 3.1 cm abdominal aortic aneurysm. Recommend followup by
ultrasound in 3 years. This recommendation follows ACR consensus
guidelines: White Paper of the ACR Incidental Findings Committee II

## 2017-10-19 MED FILL — AMOXICILLIN 875 MG TABLET: 875 | 10 days supply | Qty: 20 | Fill #0

## 2017-10-26 ENCOUNTER — Telehealth: Payer: Self-pay | Admitting: Hematology

## 2017-10-26 ENCOUNTER — Ambulatory Visit: Payer: 59

## 2017-10-26 ENCOUNTER — Other Ambulatory Visit: Payer: 59

## 2017-10-26 ENCOUNTER — Inpatient Hospital Stay: Payer: 59

## 2017-10-26 ENCOUNTER — Encounter: Payer: Self-pay | Admitting: Hematology

## 2017-10-26 ENCOUNTER — Inpatient Hospital Stay (HOSPITAL_BASED_OUTPATIENT_CLINIC_OR_DEPARTMENT_OTHER): Payer: 59 | Admitting: Hematology

## 2017-10-26 ENCOUNTER — Inpatient Hospital Stay: Payer: 59 | Attending: Hematology

## 2017-10-26 VITALS — BP 108/54 | HR 62 | Temp 98.1°F | Resp 18

## 2017-10-26 VITALS — BP 133/55 | HR 71 | Temp 98.4°F | Resp 18 | Ht 68.5 in | Wt 237.8 lb

## 2017-10-26 DIAGNOSIS — K922 Gastrointestinal hemorrhage, unspecified: Secondary | ICD-10-CM | POA: Diagnosis not present

## 2017-10-26 DIAGNOSIS — D500 Iron deficiency anemia secondary to blood loss (chronic): Secondary | ICD-10-CM | POA: Diagnosis not present

## 2017-10-26 DIAGNOSIS — D649 Anemia, unspecified: Secondary | ICD-10-CM

## 2017-10-26 LAB — CBC WITH DIFFERENTIAL/PLATELET
BASOS ABS: 0.1 10*3/uL (ref 0.0–0.1)
Basophils Relative: 1 %
EOS ABS: 0.7 10*3/uL — AB (ref 0.0–0.5)
EOS PCT: 13 %
HCT: 29.7 % — ABNORMAL LOW (ref 38.4–49.9)
Hemoglobin: 9.7 g/dL — ABNORMAL LOW (ref 13.0–17.1)
LYMPHS ABS: 0.6 10*3/uL — AB (ref 0.9–3.3)
Lymphocytes Relative: 12 %
MCH: 29 pg (ref 27.2–33.4)
MCHC: 32.7 g/dL (ref 32.0–36.0)
MCV: 88.6 fL (ref 79.3–98.0)
Monocytes Absolute: 0.8 10*3/uL (ref 0.1–0.9)
Monocytes Relative: 15 %
Neutro Abs: 3 10*3/uL (ref 1.5–6.5)
Neutrophils Relative %: 59 %
PLATELETS: 87 10*3/uL — AB (ref 140–400)
RBC: 3.35 MIL/uL — AB (ref 4.20–5.82)
RDW: 20.1 % — ABNORMAL HIGH (ref 11.0–14.6)
WBC: 5.2 10*3/uL (ref 4.0–10.3)

## 2017-10-26 LAB — SAMPLE TO BLOOD BANK

## 2017-10-26 LAB — FERRITIN: FERRITIN: 82 ng/mL (ref 24–336)

## 2017-10-26 MED ORDER — SODIUM CHLORIDE 0.9 % IV SOLN
750.0000 mg | Freq: Once | INTRAVENOUS | Status: AC
  Administered 2017-10-26: 750 mg via INTRAVENOUS
  Filled 2017-10-26: qty 15

## 2017-10-26 MED ORDER — SODIUM CHLORIDE 0.9 % IV SOLN
INTRAVENOUS | Status: DC
  Administered 2017-10-26: 15:00:00 via INTRAVENOUS
  Filled 2017-10-26: qty 250

## 2017-10-26 NOTE — Patient Instructions (Signed)
Ferric carboxymaltose injection What is this medicine? FERRIC CARBOXYMALTOSE (ferr-ik car-box-ee-mol-toes) is an iron complex. Iron is used to make healthy red blood cells, which carry oxygen and nutrients throughout the body. This medicine is used to treat anemia in people with chronic kidney disease or people who cannot take iron by mouth. This medicine may be used for other purposes; ask your health care provider or pharmacist if you have questions. COMMON BRAND NAME(S): Injectafer What should I tell my health care provider before I take this medicine? They need to know if you have any of these conditions: -anemia not caused by low iron levels -high levels of iron in the blood -liver disease -an unusual or allergic reaction to iron, other medicines, foods, dyes, or preservatives -pregnant or trying to get pregnant -breast-feeding How should I use this medicine? This medicine is for infusion into a vein. It is given by a health care professional in a hospital or clinic setting. Talk to your pediatrician regarding the use of this medicine in children. Special care may be needed. Overdosage: If you think you have taken too much of this medicine contact a poison control center or emergency room at once. NOTE: This medicine is only for you. Do not share this medicine with others. What if I miss a dose? It is important not to miss your dose. Call your doctor or health care professional if you are unable to keep an appointment. What may interact with this medicine? Do not take this medicine with any of the following medications: -deferoxamine -dimercaprol -other iron products This medicine may also interact with the following medications: -chloramphenicol -deferasirox This list may not describe all possible interactions. Give your health care provider a list of all the medicines, herbs, non-prescription drugs, or dietary supplements you use. Also tell them if you smoke, drink alcohol, or use  illegal drugs. Some items may interact with your medicine. What should I watch for while using this medicine? Visit your doctor or health care professional regularly. Tell your doctor if your symptoms do not start to get better or if they get worse. You may need blood work done while you are taking this medicine. You may need to follow a special diet. Talk to your doctor. Foods that contain iron include: whole grains/cereals, dried fruits, beans, or peas, leafy green vegetables, and organ meats (liver, kidney). What side effects may I notice from receiving this medicine? Side effects that you should report to your doctor or health care professional as soon as possible: -allergic reactions like skin rash, itching or hives, swelling of the face, lips, or tongue -breathing problems -changes in blood pressure -feeling faint or lightheaded, falls -flushing, sweating, or hot feelings Side effects that usually do not require medical attention (report to your doctor or health care professional if they continue or are bothersome): -changes in taste -constipation -dizziness -headache -nausea -pain, redness, or irritation at site where injected -vomiting This list may not describe all possible side effects. Call your doctor for medical advice about side effects. You may report side effects to FDA at 1-800-FDA-1088. Where should I keep my medicine? This drug is given in a hospital or clinic and will not be stored at home. NOTE: This sheet is a summary. It may not cover all possible information. If you have questions about this medicine, talk to your doctor, pharmacist, or health care provider.  2018 Elsevier/Gold Standard (2015-02-26 11:20:47)  

## 2017-10-26 NOTE — Progress Notes (Signed)
Pt declined to stay obs period. VSS.

## 2017-10-26 NOTE — Telephone Encounter (Signed)
Scheduled appt per 9/19 los - gave patient AVS and calender per los. - per patient no inj yet because insurance has no approved it.

## 2017-10-26 NOTE — Progress Notes (Signed)
HEMATOLOGY/ONCOLOGY CLINIC NOTE  Date of Service: 10/26/17  Patient Care Team: Tammi Sou, MD as PCP - General (Family Medicine) Juanita Craver, MD as Consulting Physician (Gastroenterology) Franchot Gallo, MD as Consulting Physician (Urology) Troy Sine, MD as Consulting Physician (Cardiology) Brunetta Genera, MD as Consulting Physician (Hematology) [name], Gaspar Skeeters, MD as Consulting Physician (Gastroenterology)  CHIEF COMPLAINTS/PURPOSE OF CONSULTATION:   F/u for anemia and ongoing GI bleeding.  HISTORY OF PRESENTING ILLNESS:   William [name] is a wonderful 67 y.o. male who has been referred to [place] by Dr .Anitra Lauth, Adrian Blackwater, MD for evaluation and management of Anemia.  Patient has a history of extensive medical comorbidities including liver cirrhosis related to [facility] with portal hypertension, esophageal varices and portal hypertensive gastropathy and splenomegaly, iron deficiency anemia, obesity, diabetes, AAA.  Patient was admitted in April 2018 with acute blood loss anemia and required transfusion of multiple units of PRBCs with iron profile suggestive of iron deficiency. No overt evidence of hemolysis noted. He had an EGD and colonoscopy that did not show any overt bleeding. Capsule endoscopy was unrevealing but the capsule past and only 27 minutes.  He follows with Dr. [name] who is his gastric oncologist.  Patient was again readmitted in June 2018 with symptomatically anemia and hemoglobin of 6.3. He underwent 4 units of PRBC transfusions again and was sent out on PPI and iron supplementation with the plan to follow-up with Dr. [name].  He was also given hematology referral. He had a repeat ultrasound of the abdomen on 07/29/2016 which showed significant increase in splenomegaly in 2 months suggesting significant portal hypertension versus some element of splenic sequestration.  He continues to note intermittent black stools.  Hemoglobin is stable and  improved today and is up to 11.1. Patient notes he feels better after his transfusions. Has not noted lower GI bleeding at this time. We discuss any other workup to rule out less likely other possibilities of his anemia.  INTERVAL HISTORY  William [name] is here for a scheduled follow-up of his anemia that is primarily related to ongoing issues with GI bleeding and has not been controllable by multiple GI interventions. The patient's last visit with [place] was on 08/28/17. He is accompanied today by his wife. The pt reports that he is doing well overall.   The pt reports that every few days he notices his stool is very dark, which resolves for a few days, and repeats. He notes that his energy levels have remained stable. He notes that he takes a nap on most days.   Lab results today (10/26/17) of CBC w/diff is as follows: all values are WNL except for RBC at 3.35, HGB at 9.7, HCT at 29.7, RDW at 20.1, PLT at 87k, Lymphs abs at 600, Eosinophils abs at 700. 10/26/17 Ferritin is WNL at 82  On review of systems, pt reports stable energy levels, occasional dark stool, and denies, other concerns for bleeding, new fatigue, abdominal pains, leg swelling, and any other symptoms.   MEDICAL HISTORY:  Past Medical History:  Diagnosis Date  . AAA (abdominal aortic aneurysm) ([place]) 03/2014   3.2 cm on MR abd.  F/u aortic u/s 06/2014 showed 3.0 x 3.1 cm AAA: recheck 2 yrs recommended (followed by cardiologist).  Minimal growth on 10/2016 CT abd done for epig pain.  Repeat u/s 10/2018.  [name] Anemia due to chronic blood loss 2018/19   GI: transfusions x >20 required; multiple endoscopies and bleeding scans unrevealing.  Hemolysis ruled out by hematologist.  Marshell Levan and octreotide + monthly iron infusions as of 04/2017.  . Bilateral renal cysts    simple (03/2014 MRI)  . CAD (coronary artery disease)   . Cholelithiases 2018   asymptomatic  . Chronic diastolic heart failure ([place])   . Cirrhosis ([place]) 05/2016   secondary to NASH;  most recent u/s abd 07/2016 showed splenomegaly.  Hx of portal HTN changes with mild ascites and splenomegaly.  . Diabetes mellitus with complication ([place]) 22/9798   A1c 6.8%  . History of blood transfusion 2018 X 4 dates   "low blood" (09/21/2016)  . Hyperlipemia, mixed    elevated LFTs when on statins.    . Hypertension    Cr bump 04/01/16 so I changed benicar-hct to benicar plain and added amlodipine 5 mg.  . Iron deficiency anemia 05/2016   Acute blood loss anemia: hospitalized, required transfusion x 3 U: colonoscopy and capsule study unrevealing.  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  W/u for ? hemolytic anemia to be pursued by hematologist as outpt.  Dr. [name], GI at [facility] following, too---he rec'd onc do bone marrow bx as of Jan 2019  . Lung field abnormal finding on examination    Bibasilar L>R mild insp crackles-->x-ray showed mild interstitial changes/fibrosis/scarring.  Changes noted on all CXRs in 2018/2019.  . Microscopic hematuria    Eval unremarkable by Dr. Eulogio Ditch.  [name] NASH (nonalcoholic steatohepatitis)    Fatty liver on MR abd 03/2014, + hx of elevated transaminases and bili: followed by Dr. [name].  . Obesity   . Spleen enlarged     SURGICAL HISTORY: Past Surgical History:  Procedure Laterality Date  . CARDIAC CATHETERIZATION    . CARDIOVASCULAR STRESS TEST  01/17/2012; 05/04/16   Normal stress nuclear study x 2 (2018--Normal perfusion. LVEF 66% with normal wall motion. This is a low risk study).  . CERVICAL DISCECTOMY  1992  . COLONOSCOPY N/A 05/27/2016   No site/explanation for blood loss found.  Erythematous mucosa in cecum and ascending colon--Cecal bx: normal.  Procedure: COLONOSCOPY;  Surgeon: Carol Ada, MD;  Location: [facility] ENDOSCOPY;  Service: Endoscopy;  Laterality: N/A;  . COLONOSCOPY W/ POLYPECTOMY  09/10/2014   Polypectomy x 2: recall 5 yrs (Dr. [name]).  . CORONARY  ANGIOPLASTY    . CORONARY ARTERY BYPASS GRAFT  03/2006  . ENTEROSCOPY N/A 07/31/2016   Procedure: ENTEROSCOPY;  Surgeon: Ladene Artist, MD;  Location: [facility] ENDOSCOPY;  Service: Endoscopy;  Laterality: N/A;  . ENTEROSCOPY N/A 12/02/2016   Procedure: ENTEROSCOPY;  Surgeon: Carol Ada, MD;  Location: [facility] ENDOSCOPY;  Service: Endoscopy;  Laterality: N/A;  . ESOPHAGOGASTRODUODENOSCOPY N/A 05/25/2016   No site/explanation for blood loss found.  Procedure: ESOPHAGOGASTRODUODENOSCOPY (EGD);  Surgeon: Juanita Craver, MD;  Location[facility] ENDOSCOPY;  Service: Endoscopy;  Laterality: N/A;  . ESOPHAGOGASTRODUODENOSCOPY N/A 09/23/2016   Procedure: ESOPHAGOGASTRODUODENOSCOPY (EGD);  Surgeon: Carol Ada, MD;  Location: [place];  Service: Endoscopy;  Laterality: N/A;  . GIVENS CAPSULE STUDY N/A 05/27/2016   No source identified.  Repeat 10/2016--results pending.  Procedure: GIVENS CAPSULE STUDY;  Surgeon: Carol Ada, MD;  Location: [facility] ENDOSCOPY;  Service: Endoscopy;  Laterality: N/A;  . GIVENS CAPSULE STUDY N/A 10/15/2016   Procedure: GIVENS CAPSULE STUDY;  Surgeon: Carol Ada, MD;  Location: [facility] ENDOSCOPY;  Service: Endoscopy;  Laterality: N/A;  . GIVENS CAPSULE STUDY N/A 02/13/2017   Procedure: GIVENS CAPSULE STUDY;  Surgeon: Carol Ada, MD;  Location: [place];  Service: Endoscopy;  Laterality: N/A;  . NECK SURGERY  1991  . NM GI BLOOD LOSS  01/27/2017   NEGATIVE  . SMALL BOWEL ENTEROSCOPY  10/2016   ([facility], Dr. [name]: Double balloon enteroscopy--to the level of proximal ileum) jejunal polyps- which were removed--not bleeding & benign.  No source of bleeding was identified.  . TRANSTHORACIC ECHOCARDIOGRAM  05/25/2016   EF 60%, normal wall motion, grd II DD, mild aortic stenosis, dilated aortic root and ascending aorta.  [name] VASECTOMY  1977    SOCIAL HISTORY: Social History   Socioeconomic History  . Marital status: Married    Spouse name: Not on file  . Number of children: Not on file  . Years  of education: Not on file  . Highest education level: Not on file  Occupational History  . Not on file  Social Needs  . Financial resource strain: Not on file  . Food insecurity:    Worry: Not on file    Inability: Not on file  . Transportation needs:    Medical: Not on file    Non-medical: Not on file  Tobacco Use  . Smoking status: Former Smoker    Types: Cigarettes  . Smokeless tobacco: Never Used  . Tobacco comment: QUIT I 1983  Substance and Sexual Activity  . Alcohol use: No  . Drug use: No  . Sexual activity: Yes  Lifestyle  . Physical activity:    Days per week: 0 days    Minutes per session: 0 min  . Stress: Not on file  Relationships  . Social connections:    Talks on phone: Not on file    Gets together: Not on file    Attends religious service: Not on file    Active member of club or organization: Not on file    Attends meetings of clubs or organizations: Not on file    Relationship status: Not on file  . Intimate partner violence:    Fear of current or ex partner: Not on file    Emotionally abused: Not on file    Physically abused: Not on file    Forced sexual activity: Not on file  Other Topics Concern  . Not on file  Social History Narrative   Married, 3 grown children, 3 GCs.   Educ: 10th grade.   Occupation: Retired Brewing technologist.   Tob: quit 1983, smoked about 20 pack-yr hx prior.   No alcohol.    FAMILY HISTORY: Family History  Problem Relation Age of Onset  . Hypertension Mother   . Cancer - Other Mother        liver cancer  . Heart disease Father   . Heart attack Father   . Cancer - Lung Father   . Diabetes Father   . Liver disease Sister   . Anemia Sister   . Heart disease Sister   . Heart attack Sister   . Heart disease Brother        CABG 20 YEARS AGO  . Hypertension Brother   . Mesothelioma Brother        HALF-BROTHER  . Cancer - Other Brother        CLL  . Cancer - Lung Brother        Mets to brain  . Cancer - Lung  Sister        HALF-SISTER  . Liver disease Brother   . Heart disease Brother  CABG-2012  . Emphysema Maternal Grandfather   . Cancer - Other Paternal Grandmother        Stomach  . Heart attack Paternal Grandfather     ALLERGIES:  is allergic to statins.  MEDICATIONS:  Current Outpatient Medications  Medication Sig Dispense Refill  . acetaminophen (TYLENOL) 325 MG tablet Take 325 mg by mouth every 6 (six) hours as needed for fever (for fever/pain.).     [name] cholecalciferol (VITAMIN D) 1000 units tablet Take 1,000 Units by mouth daily.    . fluticasone (FLONASE) 50 MCG/ACT nasal spray Place 2 sprays into both nostrils daily. 16 g 6  . lactulose (CHRONULAC) 10 GM/15ML solution Take 45 mLs (30 g total) by mouth 3 (three) times daily. Take this until you have three BM's a day. 240 mL 0  . metoprolol tartrate (LOPRESSOR) 25 MG tablet TAKE 1/2 TABLET (12.5 MG TOTAL) BY MOUTH TWICE A DAY 30 tablet 5  . ondansetron (ZOFRAN) 4 MG tablet Take 1 tablet (4 mg total) by mouth every 8 (eight) hours as needed for nausea or vomiting. 10 tablet 1  . pantoprazole (PROTONIX) 40 MG tablet TAKE 1 TABLET BY MOUTH TWICE DAILY BEFORE A MEAL 60 tablet 3  . potassium chloride SA (K-DUR,KLOR-CON) 20 MEQ tablet TAKE 1 TABLET (20 MEQ TOTAL) BY MOUTH TWICE A DAY 60 tablet 6  . spironolactone (ALDACTONE) 25 MG tablet Take 12.5 mg by mouth daily.    [name] torsemide (DEMADEX) 20 MG tablet TAKE 2 TABLETS (40 MG TOTAL) BY MOUTH DAILY. 60 tablet 3  . tranexamic acid (LYSTEDA) 650 MG TABS tablet TAKE 2 TABLETS BY MOUTH 3 TIMES DAILY. 180 tablet 1   No current facility-administered medications for this visit.    Facility-Administered Medications Ordered in Other Visits  Medication Dose Route Frequency Provider Last Rate Last Dose  . 0.9 %  sodium chloride infusion   Intravenous Continuous Brunetta Genera, MD 20 mL/hr at 10/26/17 1451    . heparin lock flush 100 unit/mL  500 Units Intracatheter Daily PRN Truitt Merle, MD       . sodium chloride flush (NS) 0.9 % injection 10 mL  10 mL Intracatheter PRN Truitt Merle, MD        REVIEW OF SYSTEMS:    A 10+ POINT REVIEW OF SYSTEMS WAS OBTAINED including neurology, dermatology, psychiatry, cardiac, respiratory, lymph, extremities, GI, GU, Musculoskeletal, constitutional, breasts, reproductive, HEENT.  All pertinent positives are noted in the HPI.  All others are negative.   PHYSICAL EXAMINATION:   ECOG PERFORMANCE STATUS: 1 - Symptomatic but completely ambulatory  VS reivewed in Epic - stable.  GENERAL:alert, in no acute distress and comfortable SKIN: no acute rashes, no significant lesions EYES: conjunctiva are pink and non-injected, sclera anicteric OROPHARYNX: MMM, no exudates, no oropharyngeal erythema or ulceration NECK: supple, no JVD LYMPH:  no palpable lymphadenopathy in the cervical, axillary or inguinal regions LUNGS: clear to auscultation b/l with normal respiratory effort HEART: regular rate & rhythm ABDOMEN:  normoactive bowel sounds , non tender, not distended. No palpable hepatosplenomegaly.  Extremity: no pedal edema PSYCH: alert & oriented x 3 with fluent speech NEURO: no focal motor/sensory deficits    LABORATORY DATA:  I have reviewed the data as listed  . CBC Latest Ref Rng & Units 10/26/2017 09/28/2017 08/28/2017  WBC 4.0 - 10.3 K/uL 5.2 4.9 6.0  Hemoglobin 13.0 - 17.1 g/dL 9.7(L) 10.0(L) 10.2(L)  Hematocrit 38.4 - 49.9 % 29.7(L) 30.9(L) 31.9(L)  Platelets 140 - 400 K/uL  87(L) 85(L) 95(L)    CMP Latest Ref Rng & Units 09/28/2017 07/28/2017 06/16/2017  Glucose 70 - 99 mg/dL 102(H) 121 108  BUN 8 - 23 mg/dL 10 12 12   Creatinine 0.61 - 1.24 mg/dL 1.28(H) 1.41(H) 1.31(H)  Sodium 135 - 145 mmol/L 136 136 136  Potassium 3.5 - 5.1 mmol/L 3.8 3.8 4.4  Chloride 98 - 111 mmol/L 106 106 107  CO2 22 - 32 mmol/L 23 21(L) 24  Calcium 8.9 - 10.3 mg/dL 8.4(L) 8.5 8.6  Total Protein 6.5 - 8.1 g/dL 6.2(L) 6.1(L) 6.1(L)  Total Bilirubin 0.3 - 1.2  mg/dL 4.8(HH) 4.5(HH) 4.4(HH)  Alkaline Phos 38 - 126 U/L 113 96 101  AST 15 - 41 U/L 38 36(H) 33  ALT 0 - 44 U/L 30 21 26     Lab Results  Component Value Date   FERRITIN 82 10/26/2017   . RADIOGRAPHIC STUDIES: I have personally reviewed the radiological images as listed and agreed with the findings in the report. No results found.  ASSESSMENT & PLAN:   67 y.o. male with multiple medical comorbidities with Karlene Lineman with liver cirrhosis with portal hypertension, esophageal varices and portal hypertensive gastropathy with ongoing chronic GI bleeding that has not been   #1 Chronic blood loss anemia.  Patient has had recurrent GI bleeding requiring > 20 units of PRBCs. since April 2018 and continues to have ongoing GI bleeding.  Previous workup showed LDH within normal limits at 220 and suggests against overt hemolysis. Sedimentation rate within normal limits. Coombs' test is negative. Myeloma panel and serum free light chains suggest no monoclonal paraproteinemia. PNH testing negative Slightly lower haptoglobin levels can be related to decreased hepatic production, low-level hemolysis due to transfusions or extravascular hemolysis due to splenic or hepatic sequestration.  small bowel endoscopy done on 12/02/2016: impression - The examined portion of the jejunum was normal.  - Normal examined duodenum. - Portal hypertensive gastropathy.  -Small (< 5 mm) esophageal varices. - No specimens collected.  Capsule Endoscopy on 02/15/17: No evidence of any bleeding during this examination. There was the possibility of an atypical AVM manifested as an erythematous patch. No evidence of any ulcerations, erosions, masses, or polyps.  Recent rbc tagged study unrevealing. Recent labs on 05/05/2017 showed: Retic Ct Pct at 3.2, RBC at 3.34, and Hgb at 9.4.  PLAN:   -Discussed pt labwork today, 10/26/17; Ferritin is holding at 82, HGB stable at 9.7 -Will continue with IV Injectafer once a month -Pt's  ferritin is holding steady with monthly IV Injectafer infusions, suggesting his monthly blood loss is about 3 units of blood -Continue Lysteda -Will continue to check labs each month  -Monthly sandostatin if approved by insurance company.  -Will continue monthly IV Injectafer unless ferritin progressively increase to >500 suggesting GI bleeding has stopped -overall his PRBC transfusion requirements have decreased with a combination of sandostatin + IV iron + lysteda -still having ongoing sub clinical GI losses as witnessed by  decreasing ferritin levels needing ongoing IV iron replacement. -he has had previous extensive GI evaluation with no overt source evidence of controllable GIB at this time. -Continue on lysteda 1326m po 3x daily given ongoing GI bleeding. -continue f/u with GI  -Continue IV Injectafer q4 weeks x 6 doses -continue q4weekly Sandostatin if approved. -labs q4weeks -RTC with Dr KIrene Limboin 12 weeks    All of the patients questions were answered with apparent satisfaction. The patient knows to call the clinic with any problems, questions or concerns.  The total time spent in the appt was 25 minutes and more than 50% was on counseling and direct patient cares.   Sullivan Lone MD MS AAHIVMS [facility] Hematology/Oncology [facility]  (Office):       [phone] (Work cell):  [phone] (Fax):           [phone]  I, Baldwin [place], am acting as a scribe for Dr. Irene Limbo  .I have reviewed the above documentation for accuracy and completeness, and I agree with the above. Brunetta Genera MD

## 2017-10-27 ENCOUNTER — Encounter: Payer: Self-pay | Admitting: Family Medicine

## 2017-11-16 ENCOUNTER — Ambulatory Visit (INDEPENDENT_AMBULATORY_CARE_PROVIDER_SITE_OTHER): Payer: 59

## 2017-11-16 ENCOUNTER — Other Ambulatory Visit: Payer: Self-pay | Admitting: Family Medicine

## 2017-11-16 DIAGNOSIS — Z23 Encounter for immunization: Secondary | ICD-10-CM

## 2017-11-16 MED FILL — PANTOPRAZOLE SOD DR 40 MG T: 40 | 30 days supply | Qty: 60 | Fill #0

## 2017-11-16 MED FILL — TORSEMIDE 20 MG TABLET: 20 | 30 days supply | Qty: 60 | Fill #0

## 2017-11-23 ENCOUNTER — Other Ambulatory Visit: Payer: Self-pay | Admitting: Hematology

## 2017-11-23 ENCOUNTER — Inpatient Hospital Stay: Payer: 59 | Attending: Hematology

## 2017-11-23 ENCOUNTER — Inpatient Hospital Stay: Payer: 59

## 2017-11-23 VITALS — BP 113/56 | HR 59 | Temp 98.1°F | Resp 18

## 2017-11-23 DIAGNOSIS — D500 Iron deficiency anemia secondary to blood loss (chronic): Secondary | ICD-10-CM | POA: Insufficient documentation

## 2017-11-23 DIAGNOSIS — D649 Anemia, unspecified: Secondary | ICD-10-CM

## 2017-11-23 DIAGNOSIS — K922 Gastrointestinal hemorrhage, unspecified: Secondary | ICD-10-CM | POA: Insufficient documentation

## 2017-11-23 LAB — CMP (CANCER CENTER ONLY)
ALBUMIN: 2.9 g/dL — AB (ref 3.5–5.0)
ALK PHOS: 130 U/L — AB (ref 38–126)
ALT: 30 U/L (ref 0–44)
ANION GAP: 8 (ref 5–15)
AST: 42 U/L — ABNORMAL HIGH (ref 15–41)
BUN: 9 mg/dL (ref 8–23)
CALCIUM: 8.6 mg/dL — AB (ref 8.9–10.3)
CO2: 23 mmol/L (ref 22–32)
Chloride: 105 mmol/L (ref 98–111)
Creatinine: 1.03 mg/dL (ref 0.61–1.24)
GFR, Est AFR Am: >60 mL/min (ref >60)
GFR, Estimated: >60 mL/min (ref >60)
GLUCOSE: 105 mg/dL — AB (ref 70–99)
POTASSIUM: 3.8 mmol/L (ref 3.5–5.1)
SODIUM: 136 mmol/L (ref 135–145)
Total Bilirubin: 5.8 mg/dL (ref 0.3–1.2)
Total Protein: 6.2 g/dL — ABNORMAL LOW (ref 6.5–8.1)

## 2017-11-23 LAB — SAMPLE TO BLOOD BANK

## 2017-11-23 LAB — CBC WITH DIFFERENTIAL/PLATELET
Abs Immature Granulocytes: 0.01 10*3/uL (ref 0.00–0.07)
Basophils Absolute: 0.1 10*3/uL (ref 0.0–0.1)
Basophils Relative: 2 %
EOS PCT: 13 %
Eosinophils Absolute: 0.7 10*3/uL — ABNORMAL HIGH (ref 0.0–0.5)
HEMATOCRIT: 30.6 % — AB (ref 39.0–52.0)
HEMOGLOBIN: 9.8 g/dL — AB (ref 13.0–17.0)
Immature Granulocytes: 0 %
LYMPHS ABS: 0.9 10*3/uL (ref 0.7–4.0)
LYMPHS PCT: 18 %
MCH: 28.7 pg (ref 26.0–34.0)
MCHC: 32 g/dL (ref 30.0–36.0)
MCV: 89.5 fL (ref 80.0–100.0)
MONO ABS: 0.7 10*3/uL (ref 0.1–1.0)
MONOS PCT: 14 %
NEUTROS ABS: 2.8 10*3/uL (ref 1.7–7.7)
Neutrophils Relative %: 53 %
Platelets: 102 10*3/uL — ABNORMAL LOW (ref 150–400)
RBC: 3.42 MIL/uL — AB (ref 4.22–5.81)
RDW: 17.5 % — ABNORMAL HIGH (ref 11.5–15.5)
WBC: 5.1 10*3/uL (ref 4.0–10.5)
nRBC: 0 % (ref 0.0–0.2)

## 2017-11-23 LAB — IRON AND TIBC
IRON: 28 ug/dL — AB (ref 42–163)
SATURATION RATIOS: 8 % — AB (ref 42–163)
TIBC: 371 ug/dL (ref 202–409)
UIBC: 343 ug/dL

## 2017-11-23 LAB — FERRITIN: Ferritin: 88 ng/mL (ref 24–336)

## 2017-11-23 MED ORDER — SODIUM CHLORIDE 0.9 % IV SOLN
INTRAVENOUS | Status: DC
  Administered 2017-11-23: 10:00:00 via INTRAVENOUS
  Filled 2017-11-23: qty 250

## 2017-11-23 MED ORDER — SODIUM CHLORIDE 0.9 % IV SOLN
750.0000 mg | Freq: Once | INTRAVENOUS | Status: AC
  Administered 2017-11-23: 750 mg via INTRAVENOUS
  Filled 2017-11-23: qty 15

## 2017-11-23 MED FILL — GENERLAC 10 GM/15 ML SOLN: 10 | 30 days supply | Qty: 2700 | Fill #4

## 2017-11-23 MED FILL — POTASSIUM CL ER 20 MEQ TAB: 20 | 30 days supply | Qty: 60 | Fill #6

## 2017-11-23 MED FILL — METOPROLOL TARTRATE 25 MG T: 25 | 30 days supply | Qty: 30 | Fill #4

## 2017-11-23 MED FILL — TRANEXAMIC ACID 650 MG TAB: 650 | 30 days supply | Qty: 180 | Fill #0

## 2017-11-23 NOTE — Patient Instructions (Signed)
Ferric carboxymaltose injection What is this medicine? FERRIC CARBOXYMALTOSE (ferr-ik car-box-ee-mol-toes) is an iron complex. Iron is used to make healthy red blood cells, which carry oxygen and nutrients throughout the body. This medicine is used to treat anemia in people with chronic kidney disease or people who cannot take iron by mouth. This medicine may be used for other purposes; ask your health care provider or pharmacist if you have questions. COMMON BRAND NAME(S): Injectafer What should I tell my health care provider before I take this medicine? They need to know if you have any of these conditions: -anemia not caused by low iron levels -high levels of iron in the blood -liver disease -an unusual or allergic reaction to iron, other medicines, foods, dyes, or preservatives -pregnant or trying to get pregnant -breast-feeding How should I use this medicine? This medicine is for infusion into a vein. It is given by a health care professional in a hospital or clinic setting. Talk to your pediatrician regarding the use of this medicine in children. Special care may be needed. Overdosage: If you think you have taken too much of this medicine contact a poison control center or emergency room at once. NOTE: This medicine is only for you. Do not share this medicine with others. What if I miss a dose? It is important not to miss your dose. Call your doctor or health care professional if you are unable to keep an appointment. What may interact with this medicine? Do not take this medicine with any of the following medications: -deferoxamine -dimercaprol -other iron products This medicine may also interact with the following medications: -chloramphenicol -deferasirox This list may not describe all possible interactions. Give your health care provider a list of all the medicines, herbs, non-prescription drugs, or dietary supplements you use. Also tell them if you smoke, drink alcohol, or use  illegal drugs. Some items may interact with your medicine. What should I watch for while using this medicine? Visit your doctor or health care professional regularly. Tell your doctor if your symptoms do not start to get better or if they get worse. You may need blood work done while you are taking this medicine. You may need to follow a special diet. Talk to your doctor. Foods that contain iron include: whole grains/cereals, dried fruits, beans, or peas, leafy green vegetables, and organ meats (liver, kidney). What side effects may I notice from receiving this medicine? Side effects that you should report to your doctor or health care professional as soon as possible: -allergic reactions like skin rash, itching or hives, swelling of the face, lips, or tongue -breathing problems -changes in blood pressure -feeling faint or lightheaded, falls -flushing, sweating, or hot feelings Side effects that usually do not require medical attention (report to your doctor or health care professional if they continue or are bothersome): -changes in taste -constipation -dizziness -headache -nausea -pain, redness, or irritation at site where injected -vomiting This list may not describe all possible side effects. Call your doctor for medical advice about side effects. You may report side effects to FDA at 1-800-FDA-1088. Where should I keep my medicine? This drug is given in a hospital or clinic and will not be stored at home. NOTE: This sheet is a summary. It may not cover all possible information. If you have questions about this medicine, talk to your doctor, pharmacist, or health care provider.  2018 Elsevier/Gold Standard (2015-02-26 11:20:47)  

## 2017-12-14 ENCOUNTER — Other Ambulatory Visit: Payer: Self-pay | Admitting: Family Medicine

## 2017-12-14 MED FILL — TORSEMIDE 20 MG TABLET: 20 | 30 days supply | Qty: 60 | Fill #1

## 2017-12-14 NOTE — Telephone Encounter (Signed)
Not sure if this medication is managed by Dr. Anitra Lauth or someone else.   Please advise. Thanks.

## 2017-12-15 MED FILL — SPIRONOLACTONE 25 MG TABLET: 25 | 30 days supply | Qty: 30 | Fill #0

## 2017-12-21 ENCOUNTER — Inpatient Hospital Stay: Payer: 59

## 2017-12-21 ENCOUNTER — Inpatient Hospital Stay: Payer: 59 | Attending: Hematology

## 2017-12-21 VITALS — BP 107/61 | HR 57 | Temp 97.8°F | Resp 16

## 2017-12-21 DIAGNOSIS — D649 Anemia, unspecified: Secondary | ICD-10-CM

## 2017-12-21 DIAGNOSIS — D500 Iron deficiency anemia secondary to blood loss (chronic): Secondary | ICD-10-CM | POA: Insufficient documentation

## 2017-12-21 DIAGNOSIS — K922 Gastrointestinal hemorrhage, unspecified: Secondary | ICD-10-CM | POA: Insufficient documentation

## 2017-12-21 LAB — SAMPLE TO BLOOD BANK

## 2017-12-21 LAB — CBC WITH DIFFERENTIAL/PLATELET
Abs Immature Granulocytes: 0.01 10*3/uL (ref 0.00–0.07)
BASOS ABS: 0.1 10*3/uL (ref 0.0–0.1)
BASOS PCT: 1 %
EOS ABS: 0.6 10*3/uL — AB (ref 0.0–0.5)
EOS PCT: 13 %
HCT: 32.3 % — ABNORMAL LOW (ref 39.0–52.0)
HEMOGLOBIN: 10.3 g/dL — AB (ref 13.0–17.0)
Immature Granulocytes: 0 %
LYMPHS PCT: 13 %
Lymphs Abs: 0.6 10*3/uL — ABNORMAL LOW (ref 0.7–4.0)
MCH: 28.5 pg (ref 26.0–34.0)
MCHC: 31.9 g/dL (ref 30.0–36.0)
MCV: 89.2 fL (ref 80.0–100.0)
MONO ABS: 0.7 10*3/uL (ref 0.1–1.0)
Monocytes Relative: 17 %
NRBC: 0 % (ref 0.0–0.2)
Neutro Abs: 2.4 10*3/uL (ref 1.7–7.7)
Neutrophils Relative %: 56 %
Platelets: 102 10*3/uL — ABNORMAL LOW (ref 150–400)
RBC: 3.62 MIL/uL — AB (ref 4.22–5.81)
RDW: 17.2 % — AB (ref 11.5–15.5)
WBC: 4.3 10*3/uL (ref 4.0–10.5)

## 2017-12-21 LAB — FERRITIN: Ferritin: 86 ng/mL (ref 24–336)

## 2017-12-21 MED ORDER — OCTREOTIDE ACETATE 30 MG IM KIT
30.0000 mg | PACK | Freq: Once | INTRAMUSCULAR | Status: AC
  Administered 2017-12-21: 30 mg via INTRAMUSCULAR

## 2017-12-21 MED ORDER — SODIUM CHLORIDE 0.9 % IV SOLN
750.0000 mg | Freq: Once | INTRAVENOUS | Status: AC
  Administered 2017-12-21: 750 mg via INTRAVENOUS
  Filled 2017-12-21: qty 15

## 2017-12-21 NOTE — Patient Instructions (Addendum)
Ferric carboxymaltose injection What is this medicine? FERRIC CARBOXYMALTOSE (ferr-ik car-box-ee-mol-toes) is an iron complex. Iron is used to make healthy red blood cells, which carry oxygen and nutrients throughout the body. This medicine is used to treat anemia in people with chronic kidney disease or people who cannot take iron by mouth. This medicine may be used for other purposes; ask your health care provider or pharmacist if you have questions. COMMON BRAND NAME(S): Injectafer What should I tell my health care provider before I take this medicine? They need to know if you have any of these conditions: -anemia not caused by low iron levels -high levels of iron in the blood -liver disease -an unusual or allergic reaction to iron, other medicines, foods, dyes, or preservatives -pregnant or trying to get pregnant -breast-feeding How should I use this medicine? This medicine is for infusion into a vein. It is given by a health care professional in a hospital or clinic setting. Talk to your pediatrician regarding the use of this medicine in children. Special care may be needed. Overdosage: If you think you have taken too much of this medicine contact a poison control center or emergency room at once. NOTE: This medicine is only for you. Do not share this medicine with others. What if I miss a dose? It is important not to miss your dose. Call your doctor or health care professional if you are unable to keep an appointment. What may interact with this medicine? Do not take this medicine with any of the following medications: -deferoxamine -dimercaprol -other iron products This medicine may also interact with the following medications: -chloramphenicol -deferasirox This list may not describe all possible interactions. Give your health care provider a list of all the medicines, herbs, non-prescription drugs, or dietary supplements you use. Also tell them if you smoke, drink alcohol, or use  illegal drugs. Some items may interact with your medicine. What should I watch for while using this medicine? Visit your doctor or health care professional regularly. Tell your doctor if your symptoms do not start to get better or if they get worse. You may need blood work done while you are taking this medicine. You may need to follow a special diet. Talk to your doctor. Foods that contain iron include: whole grains/cereals, dried fruits, beans, or peas, leafy green vegetables, and organ meats (liver, kidney). What side effects may I notice from receiving this medicine? Side effects that you should report to your doctor or health care professional as soon as possible: -allergic reactions like skin rash, itching or hives, swelling of the face, lips, or tongue -breathing problems -changes in blood pressure -feeling faint or lightheaded, falls -flushing, sweating, or hot feelings Side effects that usually do not require medical attention (report to your doctor or health care professional if they continue or are bothersome): -changes in taste -constipation -dizziness -headache -nausea -pain, redness, or irritation at site where injected -vomiting This list may not describe all possible side effects. Call your doctor for medical advice about side effects. You may report side effects to FDA at 1-800-FDA-1088. Where should I keep my medicine? This drug is given in a hospital or clinic and will not be stored at home. NOTE: This sheet is a summary. It may not cover all possible information. If you have questions about this medicine, talk to your doctor, pharmacist, or health care provider.  2018 [employer] Standard (2015-02-26 11:20:47)  Octreotide injection solution What is this medicine? OCTREOTIDE (ok TREE oh tide) is used  to reduce blood levels of growth hormone in patients with a condition called acromegaly. This medicine also reduces flushing and watery diarrhea caused by certain types  of cancer. This medicine may be used for other purposes; ask your health care provider or pharmacist if you have questions. COMMON BRAND NAME(S): Sandostatin, Sandostatin LAR What should I tell my health care provider before I take this medicine? They need to know if you have any of these conditions: -gallbladder disease -kidney disease -liver disease -an unusual or allergic reaction to octreotide, other medicines, foods, dyes, or preservatives -pregnant or trying to get pregnant -breast-feeding How should I use this medicine? This medicine is for injection under the skin or into a vein (only in emergency situations). It is usually given by a health care professional in a hospital or clinic setting. If you get this medicine at home, you will be taught how to prepare and give this medicine. Allow the injection solution to come to room temperature before use. Do not warm it artificially. Use exactly as directed. Take your medicine at regular intervals. Do not take your medicine more often than directed. It is important that you put your used needles and syringes in a special sharps container. Do not put them in a trash can. If you do not have a sharps container, call your pharmacist or healthcare provider to get one. Talk to your pediatrician regarding the use of this medicine in children. Special care may be needed. Overdosage: If you think you have taken too much of this medicine contact a poison control center or emergency room at once. NOTE: This medicine is only for you. Do not share this medicine with others. What if I miss a dose? If you miss a dose, take it as soon as you can. If it is almost time for your next dose, take only that dose. Do not take double or extra doses. What may interact with this medicine? Do not take this medicine with any of the following medications: -cisapride -droperidol -general anesthetics -grepafloxacin -perphenazine -thioridazine This medicine may also  interact with the following medications: -bromocriptine -cyclosporine -diuretics -medicines for blood pressure, heart disease, irregular heart beat -medicines for diabetes, including insulin -quinidine This list may not describe all possible interactions. Give your health care provider a list of all the medicines, herbs, non-prescription drugs, or dietary supplements you use. Also tell them if you smoke, drink alcohol, or use illegal drugs. Some items may interact with your medicine. What should I watch for while using this medicine? Visit your doctor or health care professional for regular checks on your progress. To help reduce irritation at the injection site, use a different site for each injection and make sure the solution is at room temperature before use. This medicine may cause increases or decreases in blood sugar. Signs of high blood sugar include frequent urination, unusual thirst, flushed or dry skin, difficulty breathing, drowsiness, stomach ache, nausea, vomiting or dry mouth. Signs of low blood sugar include chills, cool, pale skin or cold sweats, drowsiness, extreme hunger, fast heartbeat, headache, nausea, nervousness or anxiety, shakiness, trembling, unsteadiness, tiredness, or weakness. Contact your doctor or health care professional right away if you experience any of these symptoms. What side effects may I notice from receiving this medicine? Side effects that you should report to your doctor or health care professional as soon as possible: -allergic reactions like skin rash, itching or hives, swelling of the face, lips, or tongue -changes in blood sugar -changes in heart  rate -severe stomach pain Side effects that usually do not require medical attention (report to your doctor or health care professional if they continue or are bothersome): -diarrhea or constipation -gas or stomach pain -nausea, vomiting -pain, redness, swelling and irritation at site where injected This  list may not describe all possible side effects. Call your doctor for medical advice about side effects. You may report side effects to FDA at 1-800-FDA-1088. Where should I keep my medicine? Keep out of the reach of children. Store in a refrigerator between 2 and 8 degrees C (36 and 46 degrees F). Protect from light. Allow to come to room temperature naturally. Do not use artificial heat. If protected from light, the injection may be stored at room temperature between 20 and 30 degrees C (70 and 86 degrees F) for 14 days. After the initial use, throw away any unused portion of a multiple dose vial after 14 days. Throw away unused portions of the ampules after use. NOTE: This sheet is a summary. It may not cover all possible information. If you have questions about this medicine, talk to your doctor, pharmacist, or health care provider.  2018 [employer] Standard (2007-08-21 16:56:04)

## 2017-12-25 MED FILL — METOPROLOL TARTRATE 25 MG T: 25 | 30 days supply | Qty: 30 | Fill #5

## 2018-01-10 ENCOUNTER — Other Ambulatory Visit: Payer: Self-pay | Admitting: Family Medicine

## 2018-01-10 MED FILL — POTASSIUM CHLORIDE CRYS ER: 20 | 30 days supply | Qty: 60 | Fill #0

## 2018-01-10 MED FILL — TRANEXAMIC ACID 650 MG TAB: 650 | 30 days supply | Qty: 180 | Fill #1

## 2018-01-10 MED FILL — PANTOPRAZOLE SOD DR 40 MG T: 40 | 30 days supply | Qty: 60 | Fill #1

## 2018-01-18 NOTE — Progress Notes (Signed)
HEMATOLOGY/ONCOLOGY CLINIC NOTE  Date of Service: 01/19/18  Patient Care Team: Tammi Sou, MD as PCP - General (Family Medicine) Juanita Craver, MD as Consulting Physician (Gastroenterology) Franchot Gallo, MD as Consulting Physician (Urology) Troy Sine, MD as Consulting Physician (Cardiology) Brunetta Genera, MD as Consulting Physician (Hematology) [name], Gaspar Skeeters, MD as Consulting Physician (Gastroenterology)  CHIEF COMPLAINTS/PURPOSE OF CONSULTATION:   F/u for anemia and ongoing GI bleeding.  HISTORY OF PRESENTING ILLNESS:   William [name] is a wonderful 67 y.o. male who has been referred to [place] by Dr .Anitra Lauth, Adrian Blackwater, MD for evaluation and management of Anemia.  Patient has a history of extensive medical comorbidities including liver cirrhosis related to [facility] with portal hypertension, esophageal varices and portal hypertensive gastropathy and splenomegaly, iron deficiency anemia, obesity, diabetes, AAA.  Patient was admitted in April 2018 with acute blood loss anemia and required transfusion of multiple units of PRBCs with iron profile suggestive of iron deficiency. No overt evidence of hemolysis noted. He had an EGD and colonoscopy that did not show any overt bleeding. Capsule endoscopy was unrevealing but the capsule past and only 27 minutes.  He follows with Dr. [name] who is his gastric oncologist.  Patient was again readmitted in June 2018 with symptomatically anemia and hemoglobin of 6.3. He underwent 4 units of PRBC transfusions again and was sent out on PPI and iron supplementation with the plan to follow-up with Dr. [name].  He was also given hematology referral. He had a repeat ultrasound of the abdomen on 07/29/2016 which showed significant increase in splenomegaly in 2 months suggesting significant portal hypertension versus some element of splenic sequestration.  He continues to note intermittent black stools.  Hemoglobin is stable and  improved today and is up to 11.1. Patient notes he feels better after his transfusions. Has not noted lower GI bleeding at this time. We discuss any other workup to rule out less likely other possibilities of his anemia.  INTERVAL HISTORY  William [name] is here for a scheduled follow-up of his anemia that is primarily related to ongoing issues with GI bleeding and has not been controllable by multiple GI interventions. The patient's last visit with [place] was on 10/26/17. He is accompanied today by his wife. The pt reports that he is doing well overall.   The pt reports that he has continued to feel better. He notes that Sandostatin has "helped a whole lot." He notes that he can tell a significant change in his energy levels and bowel habits. He was not on monthly Sandostatin from May to November due to insurance concerns, but is very happy to be back on Sandostatin.   The patient's last transfusion was in February.   Lab results today (01/19/18) of CBC w/diff, Reticulocytes, and CMP is as follows: all values are WNL except for RBC at 3.88, HGB at 11.5, HCT at 35.5, RDW at 19.4, PLT at 86k, Immature Retic fract at 17.6%, CO2 at 21, Glucose at 185, Creatinine at 1.30, Calcium at 8.4, Total Protein at 6.4, Albumin at 2.9, Alk Phos at 144, Total Bilirubin at 6.2, GFR at 56. 01/19/18 Ferritin is 123  On review of systems, pt reports moving his bowels well and regularly, decreased ankle swelling, much improved energy levels, and denies abdominal pains, concerns for infections, and any .   MEDICAL HISTORY:  Past Medical History:  Diagnosis Date  . AAA (abdominal aortic aneurysm) ([place]) 03/2014   3.2 cm on MR abd.  F/u aortic u/s 06/2014 showed 3.0 x 3.1 cm AAA: recheck 2 yrs recommended (followed by cardiologist).  Minimal growth on 10/2016 CT abd done for epig pain.  Repeat u/s 10/2018.  [name] Anemia due to chronic blood loss 2018/19   GI: transfusions x >20 required; multiple endoscopies and bleeding scans  unrevealing.  Hemolysis ruled out by hematologist.  Marshell Levan and octreotide + monthly iron infusions as of 04/2017.  . Bilateral renal cysts    simple (03/2014 MRI)  . CAD (coronary artery disease)   . Cholelithiases 2018   asymptomatic  . Chronic diastolic heart failure ([place])   . Cirrhosis ([place]) 05/2016   secondary to NASH; most recent u/s abd 07/2016 showed splenomegaly.  Hx of portal HTN changes with mild ascites and splenomegaly.  . Diabetes mellitus with complication ([place]) 81/0175   A1c 6.8%  . History of blood transfusion 2018 X 4 dates   "low blood" (09/21/2016)  . Hyperlipemia, mixed    elevated LFTs when on statins.    . Hypertension    Cr bump 04/01/16 so I changed benicar-hct to benicar plain and added amlodipine 5 mg.  . Iron deficiency anemia 05/2016   Acute blood loss anemia: hospitalized, required transfusion x 3 U: colonoscopy and capsule study unrevealing.  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  W/u for ? hemolytic anemia to be pursued by hematologist as outpt.  Dr. [name], GI at [facility] following, too---he rec'd onc do bone marrow bx as of Jan 2019  . Lung field abnormal finding on examination    Bibasilar L>R mild insp crackles-->x-ray showed mild interstitial changes/fibrosis/scarring.  Changes noted on all CXRs in 2018/2019.  . Microscopic hematuria    Eval unremarkable by Dr. Eulogio Ditch.  [name] NASH (nonalcoholic steatohepatitis)    Fatty liver on MR abd 03/2014, + hx of elevated transaminases and bili: followed by Dr. [name].  . Obesity   . Spleen enlarged     SURGICAL HISTORY: Past Surgical History:  Procedure Laterality Date  . CARDIAC CATHETERIZATION    . CARDIOVASCULAR STRESS TEST  01/17/2012; 05/04/16   Normal stress nuclear study x 2 (2018--Normal perfusion. LVEF 66% with normal wall motion. This is a low risk study).  . CERVICAL DISCECTOMY  1992  . COLONOSCOPY N/A 05/27/2016    No site/explanation for blood loss found.  Erythematous mucosa in cecum and ascending colon--Cecal bx: normal.  Procedure: COLONOSCOPY;  Surgeon: Carol Ada, MD;  Location: [facility] ENDOSCOPY;  Service: Endoscopy;  Laterality: N/A;  . COLONOSCOPY W/ POLYPECTOMY  09/10/2014   Polypectomy x 2: recall 5 yrs (Dr. [name]).  . CORONARY ANGIOPLASTY    . CORONARY ARTERY BYPASS GRAFT  03/2006  . ENTEROSCOPY N/A 07/31/2016   Procedure: ENTEROSCOPY;  Surgeon: Ladene Artist, MD;  Location: [facility] ENDOSCOPY;  Service: Endoscopy;  Laterality: N/A;  . ENTEROSCOPY N/A 12/02/2016   Procedure: ENTEROSCOPY;  Surgeon: Carol Ada, MD;  Location: [facility] ENDOSCOPY;  Service: Endoscopy;  Laterality: N/A;  . ESOPHAGOGASTRODUODENOSCOPY N/A 05/25/2016   No site/explanation for blood loss found.  Procedure: ESOPHAGOGASTRODUODENOSCOPY (EGD);  Surgeon: Juanita Craver, MD;  Location[facility] ENDOSCOPY;  Service: Endoscopy;  Laterality: N/A;  . ESOPHAGOGASTRODUODENOSCOPY N/A 09/23/2016   Procedure: ESOPHAGOGASTRODUODENOSCOPY (EGD);  Surgeon: Carol Ada, MD;  Location: [place];  Service: Endoscopy;  Laterality: N/A;  . GIVENS CAPSULE STUDY N/A 05/27/2016   No source identified.  Repeat 10/2016--results pending.  Procedure: GIVENS CAPSULE STUDY;  Surgeon: Carol Ada, MD;  Location: [facility] ENDOSCOPY;  Service: Endoscopy;  Laterality: N/A;  . GIVENS CAPSULE STUDY N/A 10/15/2016   Procedure: GIVENS CAPSULE STUDY;  Surgeon: Carol Ada, MD;  Location: [facility] ENDOSCOPY;  Service: Endoscopy;  Laterality: N/A;  . GIVENS CAPSULE STUDY N/A 02/13/2017   Procedure: GIVENS CAPSULE STUDY;  Surgeon: Carol Ada, MD;  Location: [place];  Service: Endoscopy;  Laterality: N/A;  . NECK SURGERY  1991  . NM GI BLOOD LOSS  01/27/2017   NEGATIVE  . SMALL BOWEL ENTEROSCOPY  10/2016   ([facility], Dr. [name]: Double balloon enteroscopy--to the level of proximal ileum) jejunal polyps- which were removed--not bleeding & benign.  No source of bleeding was identified.  .  TRANSTHORACIC ECHOCARDIOGRAM  05/25/2016   EF 60%, normal wall motion, grd II DD, mild aortic stenosis, dilated aortic root and ascending aorta.  [name] VASECTOMY  1977    SOCIAL HISTORY: Social History   Socioeconomic History  . Marital status: Married    Spouse name: Not on file  . Number of children: Not on file  . Years of education: Not on file  . Highest education level: Not on file  Occupational History  . Not on file  Social Needs  . Financial resource strain: Not on file  . Food insecurity:    Worry: Not on file    Inability: Not on file  . Transportation needs:    Medical: Not on file    Non-medical: Not on file  Tobacco Use  . Smoking status: Former Smoker    Types: Cigarettes  . Smokeless tobacco: Never Used  . Tobacco comment: QUIT I 1983  Substance and Sexual Activity  . Alcohol use: No  . Drug use: No  . Sexual activity: Yes  Lifestyle  . Physical activity:    Days per week: 0 days    Minutes per session: 0 min  . Stress: Not on file  Relationships  . Social connections:    Talks on phone: Not on file    Gets together: Not on file    Attends religious service: Not on file    Active member of club or organization: Not on file    Attends meetings of clubs or organizations: Not on file    Relationship status: Not on file  . Intimate partner violence:    Fear of current or ex partner: Not on file    Emotionally abused: Not on file    Physically abused: Not on file    Forced sexual activity: Not on file  Other Topics Concern  . Not on file  Social History Narrative   Married, 3 grown children, 3 GCs.   Educ: 10th grade.   Occupation: Retired Brewing technologist.   Tob: quit 1983, smoked about 20 pack-yr hx prior.   No alcohol.    FAMILY HISTORY: Family History  Problem Relation Age of Onset  . Hypertension Mother   . Cancer - Other Mother        liver cancer  . Heart disease Father   . Heart attack Father   . Cancer - Lung Father   . Diabetes  Father   . Liver disease Sister   . Anemia Sister   . Heart disease Sister   . Heart attack Sister   . Heart disease Brother        CABG 20 YEARS AGO  . Hypertension Brother   . Mesothelioma Brother        HALF-BROTHER  .  Cancer - Other Brother        CLL  . Cancer - Lung Brother        Mets to brain  . Cancer - Lung Sister        HALF-SISTER  . Liver disease Brother   . Heart disease Brother        CABG-2012  . Emphysema Maternal Grandfather   . Cancer - Other Paternal Grandmother        Stomach  . Heart attack Paternal Grandfather     ALLERGIES:  is allergic to statins.  MEDICATIONS:  Current Outpatient Medications  Medication Sig Dispense Refill  . acetaminophen (TYLENOL) 325 MG tablet Take 325 mg by mouth every 6 (six) hours as needed for fever (for fever/pain.).     [name] cholecalciferol (VITAMIN D) 1000 units tablet Take 1,000 Units by mouth daily.    . fluticasone (FLONASE) 50 MCG/ACT nasal spray Place 2 sprays into both nostrils daily. 16 g 6  . lactulose (CHRONULAC) 10 GM/15ML solution Take 45 mLs (30 g total) by mouth 3 (three) times daily. Take this until you have three BM's a day. 240 mL 0  . metoprolol tartrate (LOPRESSOR) 25 MG tablet TAKE 1/2 TABLET (12.5 MG TOTAL) BY MOUTH TWICE A DAY 30 tablet 5  . ondansetron (ZOFRAN) 4 MG tablet Take 1 tablet (4 mg total) by mouth every 8 (eight) hours as needed for nausea or vomiting. 10 tablet 1  . pantoprazole (PROTONIX) 40 MG tablet TAKE 1 TABLET BY MOUTH TWICE DAILY BEFORE A MEAL 60 tablet 3  . potassium chloride SA (K-DUR,KLOR-CON) 20 MEQ tablet TAKE 1 TABLET (20 MEQ TOTAL) BY MOUTH TWICE A DAY 60 tablet 6  . spironolactone (ALDACTONE) 25 MG tablet Take 12.5 mg by mouth daily.    [name] spironolactone (ALDACTONE) 25 MG tablet TAKE 1 TABLET (25 MG TOTAL) BY MOUTH DAILY. 30 tablet 6  . torsemide (DEMADEX) 20 MG tablet TAKE 2 TABLETS BY MOUTH DAILY. 60 tablet 3  . tranexamic acid (LYSTEDA) 650 MG TABS tablet TAKE 2 TABLETS BY  MOUTH 3 TIMES DAILY. 180 tablet 1   No current facility-administered medications for this visit.    Facility-Administered Medications Ordered in Other Visits  Medication Dose Route Frequency Provider Last Rate Last Dose  . heparin lock flush 100 unit/mL  500 Units Intracatheter Daily PRN Truitt Merle, MD      . sodium chloride flush (NS) 0.9 % injection 10 mL  10 mL Intracatheter PRN Truitt Merle, MD        REVIEW OF SYSTEMS:    A 10+ POINT REVIEW OF SYSTEMS WAS OBTAINED including neurology, dermatology, psychiatry, cardiac, respiratory, lymph, extremities, GI, GU, Musculoskeletal, constitutional, breasts, reproductive, HEENT.  All pertinent positives are noted in the HPI.  All others are negative.   PHYSICAL EXAMINATION:   ECOG PERFORMANCE STATUS: 1 - Symptomatic but completely ambulatory  VS reivewed in Epic - stable.  GENERAL:alert, in no acute distress and comfortable SKIN: no acute rashes, no significant lesions EYES: conjunctiva are pink and non-injected, sclera anicteric OROPHARYNX: MMM, no exudates, no oropharyngeal erythema or ulceration NECK: supple, no JVD LYMPH:  no palpable lymphadenopathy in the cervical, axillary or inguinal regions LUNGS: clear to auscultation b/l with normal respiratory effort HEART: regular rate & rhythm ABDOMEN:  normoactive bowel sounds , non tender, not distended. No palpable hepatosplenomegaly.  Extremity: no pedal edema PSYCH: alert & oriented x 3 with fluent speech NEURO: no focal motor/sensory deficits   LABORATORY DATA:  I have reviewed the data as listed  . CBC Latest Ref Rng & Units 01/19/2018 12/21/2017 11/23/2017  WBC 4.0 - 10.5 K/uL 5.5 4.3 5.1  Hemoglobin 13.0 - 17.0 g/dL 11.5(L) 10.3(L) 9.8(L)  Hematocrit 39.0 - 52.0 % 35.5(L) 32.3(L) 30.6(L)  Platelets 150 - 400 K/uL 86(L) 102(L) 102(L)    CMP Latest Ref Rng & Units 01/19/2018 11/23/2017 09/28/2017  Glucose 70 - 99 mg/dL 185(H) 105(H) 102(H)  BUN 8 - 23 mg/dL _0 Creatinine 0.61 - 1.24 mg/dL 1.30(H) 1.03 1.28(H)  Sodium 135 - 145 mmol/L 136 136 136  Potassium 3.5 - 5.1 mmol/L 4.0 3.8 3.8  Chloride 98 - 111 mmol/L 105 105 106  CO2 22 - 32 mmol/L 21(L) 23 23  Calcium 8.9 - 10.3 mg/dL 8.4(L) 8.6(L) 8.4(L)  Total Protein 6.5 - 8.1 g/dL 6.4(L) 6.2(L) 6.2(L)  Total Bilirubin 0.3 - 1.2 mg/dL 6.2(HH) 5.8(HH) 4.8(HH)  Alkaline Phos 38 - 126 U/L 144(H) 130(H) 113  AST 15 - 41 U/L 38 42(H) 38  ALT 0 - 44 U/L _1 Lab Results  Component Value Date   FERRITIN 86 12/21/2017   . RADIOGRAPHIC STUDIES: I have personally reviewed the radiological images as listed and agreed with the findings in the report. No results found.  ASSESSMENT & PLAN:   67 y.o. male with multiple medical comorbidities with Karlene Lineman with liver cirrhosis with portal hypertension, esophageal varices and portal hypertensive gastropathy with ongoing chronic GI bleeding that has not been   #1 Chronic blood loss anemia.  Patient has had recurrent GI bleeding requiring > 20 units of PRBCs. since April 2018 and continues to have ongoing GI bleeding.  Previous workup showed LDH within normal limits at 220 and suggests against overt hemolysis. Sedimentation rate within normal limits. Coombs' test is negative. Myeloma panel and serum free light chains suggest no monoclonal paraproteinemia. PNH testing negative Slightly lower haptoglobin levels can be related to decreased hepatic production, low-level hemolysis due to transfusions or extravascular hemolysis due to splenic or hepatic sequestration.  small bowel endoscopy done on 12/02/2016: impression - The examined portion of the jejunum was normal.  - Normal examined duodenum. - Portal hypertensive gastropathy.  -Small (< 5 mm) esophageal varices. - No specimens collected.  Capsule Endoscopy on 02/15/17: No evidence of any bleeding during this examination. There was the possibility of an atypical AVM manifested as an erythematous  patch. No evidence of any ulcerations, erosions, masses, or polyps.  Recent rbc tagged study unrevealing. Recent labs on 05/05/2017 showed: Retic Ct Pct at 3.2, RBC at 3.34, and Hgb at 9.4.  PLAN:   -Discussed pt labwork today, 01/19/18; HGB improved to 11.5, PLT slightly decreased to 86k, WBC remain normal. Total Bilirubin increased to 6.2 -01/19/18 Ferritin is 123 -Continue follow up with GI and liver management -Will continue monthly IV Injectafer, as long as Ferritin levels are not prohibitive >500, and then will hope to decrease frequency   -Continue monthly Sandostatin  -Pt's ferritin is holding steady with monthly IV Injectafer infusions, suggesting his monthly blood loss is about 3 units of blood -Continue Lysteda -overall his PRBC transfusion requirements have decreased with a combination of sandostatin + IV iron + lysteda -still having ongoing sub clinical GI losses as witnessed by  decreasing ferritin levels needing ongoing IV iron replacement. -he has had previous extensive GI evaluation with no overt source evidence of controllable GIB at this time. -Continue on lysteda 1339m po 3x  daily given ongoing GI bleeding. -Will see the pt back in 12 weeks   -Continue IV Injectafer q4 weeks x 6 doses -continue q4weekly Sandostatin if approved. -labs q4weeks -RTC with Dr Irene Limbo in 12 weeks    All of the patients questions were answered with apparent satisfaction. The patient knows to call the clinic with any problems, questions or concerns.   The total time spent in the appt was 20 minutes and more than 50% was on counseling and direct patient cares.   Sullivan Lone MD [place] AAHIVMS [facility] Hematology/Oncology Physician [facility]  (Office):       [phone] (Work cell):  [phone] (Fax):           [phone]  I, Baldwin Jamaica, am acting as a scribe for Dr. Sullivan Lone.   .I have reviewed the above documentation for accuracy and completeness, and I agree  with the above. Brunetta Genera MD

## 2018-01-19 ENCOUNTER — Inpatient Hospital Stay: Payer: 59

## 2018-01-19 ENCOUNTER — Inpatient Hospital Stay: Payer: 59 | Attending: Hematology

## 2018-01-19 ENCOUNTER — Inpatient Hospital Stay (HOSPITAL_BASED_OUTPATIENT_CLINIC_OR_DEPARTMENT_OTHER): Payer: 59 | Admitting: Hematology

## 2018-01-19 ENCOUNTER — Encounter: Payer: Self-pay | Admitting: Hematology

## 2018-01-19 VITALS — BP 112/53 | HR 65 | Temp 97.7°F | Resp 18 | Ht 68.5 in | Wt 233.9 lb

## 2018-01-19 DIAGNOSIS — Z87891 Personal history of nicotine dependence: Secondary | ICD-10-CM

## 2018-01-19 DIAGNOSIS — D500 Iron deficiency anemia secondary to blood loss (chronic): Secondary | ICD-10-CM

## 2018-01-19 DIAGNOSIS — K922 Gastrointestinal hemorrhage, unspecified: Secondary | ICD-10-CM | POA: Insufficient documentation

## 2018-01-19 DIAGNOSIS — D649 Anemia, unspecified: Secondary | ICD-10-CM

## 2018-01-19 LAB — FERRITIN: FERRITIN: 123 ng/mL (ref 24–336)

## 2018-01-19 LAB — SAMPLE TO BLOOD BANK

## 2018-01-19 LAB — CBC WITH DIFFERENTIAL (CANCER CENTER ONLY)
ABS IMMATURE GRANULOCYTES: 0.02 10*3/uL (ref 0.00–0.07)
BASOS PCT: 2 %
Basophils Absolute: 0.1 10*3/uL (ref 0.0–0.1)
EOS PCT: 11 %
Eosinophils Absolute: 0.6 10*3/uL — ABNORMAL HIGH (ref 0.0–0.5)
HCT: 35.5 % — ABNORMAL LOW (ref 39.0–52.0)
HEMOGLOBIN: 11.5 g/dL — AB (ref 13.0–17.0)
Immature Granulocytes: 0 %
Lymphocytes Relative: 12 %
Lymphs Abs: 0.7 10*3/uL (ref 0.7–4.0)
MCH: 29.6 pg (ref 26.0–34.0)
MCHC: 32.4 g/dL (ref 30.0–36.0)
MCV: 91.5 fL (ref 80.0–100.0)
MONOS PCT: 13 %
Monocytes Absolute: 0.7 10*3/uL (ref 0.1–1.0)
NEUTROS ABS: 3.4 10*3/uL (ref 1.7–7.7)
Neutrophils Relative %: 62 %
PLATELETS: 86 10*3/uL — AB (ref 150–400)
RBC: 3.88 MIL/uL — AB (ref 4.22–5.81)
RDW: 19.4 % — ABNORMAL HIGH (ref 11.5–15.5)
WBC: 5.5 10*3/uL (ref 4.0–10.5)
nRBC: 0 % (ref 0.0–0.2)

## 2018-01-19 LAB — CMP (CANCER CENTER ONLY)
ALT: 25 U/L (ref 0–44)
ANION GAP: 10 (ref 5–15)
AST: 38 U/L (ref 15–41)
Albumin: 2.9 g/dL — ABNORMAL LOW (ref 3.5–5.0)
Alkaline Phosphatase: 144 U/L — ABNORMAL HIGH (ref 38–126)
BUN: 13 mg/dL (ref 8–23)
CHLORIDE: 105 mmol/L (ref 98–111)
CO2: 21 mmol/L — AB (ref 22–32)
Calcium: 8.4 mg/dL — ABNORMAL LOW (ref 8.9–10.3)
Creatinine: 1.3 mg/dL — ABNORMAL HIGH (ref 0.61–1.24)
GFR, EST NON AFRICAN AMERICAN: 56 mL/min — AB (ref >60)
Glucose, Bld: 185 mg/dL — ABNORMAL HIGH (ref 70–99)
POTASSIUM: 4 mmol/L (ref 3.5–5.1)
SODIUM: 136 mmol/L (ref 135–145)
Total Bilirubin: 6.2 mg/dL (ref 0.3–1.2)
Total Protein: 6.4 g/dL — ABNORMAL LOW (ref 6.5–8.1)

## 2018-01-19 LAB — RETICULOCYTES
Immature Retic Fract: 17.6 % — ABNORMAL HIGH (ref 2.3–15.9)
RBC.: 3.88 MIL/uL — ABNORMAL LOW (ref 4.22–5.81)
RETIC CT PCT: 2.9 % (ref 0.4–3.1)
Retic Count, Absolute: 111.4 10*3/uL (ref 19.0–186.0)

## 2018-01-19 LAB — IRON AND TIBC
Iron: 57 ug/dL (ref 42–163)
Saturation Ratios: 18 % — ABNORMAL LOW (ref 20–55)
TIBC: 325 ug/dL (ref 202–409)
UIBC: 268 ug/dL (ref 117–376)

## 2018-01-19 MED ORDER — SODIUM CHLORIDE 0.9 % IV SOLN
750.0000 mg | Freq: Once | INTRAVENOUS | Status: AC
  Administered 2018-01-19: 750 mg via INTRAVENOUS
  Filled 2018-01-19: qty 15

## 2018-01-19 MED ORDER — OCTREOTIDE ACETATE 30 MG IM KIT
PACK | INTRAMUSCULAR | Status: AC
  Filled 2018-01-19: qty 1

## 2018-01-19 MED ORDER — SODIUM CHLORIDE 0.9 % IV SOLN
INTRAVENOUS | Status: DC
  Administered 2018-01-19: 11:00:00 via INTRAVENOUS
  Filled 2018-01-19: qty 250

## 2018-01-19 MED ORDER — OCTREOTIDE ACETATE 30 MG IM KIT
30.0000 mg | PACK | Freq: Once | INTRAMUSCULAR | Status: AC
  Administered 2018-01-19: 30 mg via INTRAMUSCULAR

## 2018-01-19 NOTE — Patient Instructions (Signed)
Ferric carboxymaltose injection What is this medicine? FERRIC CARBOXYMALTOSE (ferr-ik car-box-ee-mol-toes) is an iron complex. Iron is used to make healthy red blood cells, which carry oxygen and nutrients throughout the body. This medicine is used to treat anemia in people with chronic kidney disease or people who cannot take iron by mouth. This medicine may be used for other purposes; ask your health care provider or pharmacist if you have questions. COMMON BRAND NAME(S): Injectafer What should I tell my health care provider before I take this medicine? They need to know if you have any of these conditions: -anemia not caused by low iron levels -high levels of iron in the blood -liver disease -an unusual or allergic reaction to iron, other medicines, foods, dyes, or preservatives -pregnant or trying to get pregnant -breast-feeding How should I use this medicine? This medicine is for infusion into a vein. It is given by a health care professional in a hospital or clinic setting. Talk to your pediatrician regarding the use of this medicine in children. Special care may be needed. Overdosage: If you think you have taken too much of this medicine contact a poison control center or emergency room at once. NOTE: This medicine is only for you. Do not share this medicine with others. What if I miss a dose? It is important not to miss your dose. Call your doctor or health care professional if you are unable to keep an appointment. What may interact with this medicine? Do not take this medicine with any of the following medications: -deferoxamine -dimercaprol -other iron products This medicine may also interact with the following medications: -chloramphenicol -deferasirox This list may not describe all possible interactions. Give your health care provider a list of all the medicines, herbs, non-prescription drugs, or dietary supplements you use. Also tell them if you smoke, drink alcohol, or use  illegal drugs. Some items may interact with your medicine. What should I watch for while using this medicine? Visit your doctor or health care professional regularly. Tell your doctor if your symptoms do not start to get better or if they get worse. You may need blood work done while you are taking this medicine. You may need to follow a special diet. Talk to your doctor. Foods that contain iron include: whole grains/cereals, dried fruits, beans, or peas, leafy green vegetables, and organ meats (liver, kidney). What side effects may I notice from receiving this medicine? Side effects that you should report to your doctor or health care professional as soon as possible: -allergic reactions like skin rash, itching or hives, swelling of the face, lips, or tongue -breathing problems -changes in blood pressure -feeling faint or lightheaded, falls -flushing, sweating, or hot feelings Side effects that usually do not require medical attention (report to your doctor or health care professional if they continue or are bothersome): -changes in taste -constipation -dizziness -headache -nausea -pain, redness, or irritation at site where injected -vomiting This list may not describe all possible side effects. Call your doctor for medical advice about side effects. You may report side effects to FDA at 1-800-FDA-1088. Where should I keep my medicine? This drug is given in a hospital or clinic and will not be stored at home. NOTE: This sheet is a summary. It may not cover all possible information. If you have questions about this medicine, talk to your doctor, pharmacist, or health care provider.  2018 [employer] Standard (2015-02-26 11:20:47)  Octreotide injection solution What is this medicine? OCTREOTIDE (ok TREE oh tide) is used  to reduce blood levels of growth hormone in patients with a condition called acromegaly. This medicine also reduces flushing and watery diarrhea caused by certain types  of cancer. This medicine may be used for other purposes; ask your health care provider or pharmacist if you have questions. COMMON BRAND NAME(S): Sandostatin, Sandostatin LAR What should I tell my health care provider before I take this medicine? They need to know if you have any of these conditions: -gallbladder disease -kidney disease -liver disease -an unusual or allergic reaction to octreotide, other medicines, foods, dyes, or preservatives -pregnant or trying to get pregnant -breast-feeding How should I use this medicine? This medicine is for injection under the skin or into a vein (only in emergency situations). It is usually given by a health care professional in a hospital or clinic setting. If you get this medicine at home, you will be taught how to prepare and give this medicine. Allow the injection solution to come to room temperature before use. Do not warm it artificially. Use exactly as directed. Take your medicine at regular intervals. Do not take your medicine more often than directed. It is important that you put your used needles and syringes in a special sharps container. Do not put them in a trash can. If you do not have a sharps container, call your pharmacist or healthcare provider to get one. Talk to your pediatrician regarding the use of this medicine in children. Special care may be needed. Overdosage: If you think you have taken too much of this medicine contact a poison control center or emergency room at once. NOTE: This medicine is only for you. Do not share this medicine with others. What if I miss a dose? If you miss a dose, take it as soon as you can. If it is almost time for your next dose, take only that dose. Do not take double or extra doses. What may interact with this medicine? Do not take this medicine with any of the following medications: -cisapride -droperidol -general anesthetics -grepafloxacin -perphenazine -thioridazine This medicine may also  interact with the following medications: -bromocriptine -cyclosporine -diuretics -medicines for blood pressure, heart disease, irregular heart beat -medicines for diabetes, including insulin -quinidine This list may not describe all possible interactions. Give your health care provider a list of all the medicines, herbs, non-prescription drugs, or dietary supplements you use. Also tell them if you smoke, drink alcohol, or use illegal drugs. Some items may interact with your medicine. What should I watch for while using this medicine? Visit your doctor or health care professional for regular checks on your progress. To help reduce irritation at the injection site, use a different site for each injection and make sure the solution is at room temperature before use. This medicine may cause increases or decreases in blood sugar. Signs of high blood sugar include frequent urination, unusual thirst, flushed or dry skin, difficulty breathing, drowsiness, stomach ache, nausea, vomiting or dry mouth. Signs of low blood sugar include chills, cool, pale skin or cold sweats, drowsiness, extreme hunger, fast heartbeat, headache, nausea, nervousness or anxiety, shakiness, trembling, unsteadiness, tiredness, or weakness. Contact your doctor or health care professional right away if you experience any of these symptoms. What side effects may I notice from receiving this medicine? Side effects that you should report to your doctor or health care professional as soon as possible: -allergic reactions like skin rash, itching or hives, swelling of the face, lips, or tongue -changes in blood sugar -changes in heart  rate -severe stomach pain Side effects that usually do not require medical attention (report to your doctor or health care professional if they continue or are bothersome): -diarrhea or constipation -gas or stomach pain -nausea, vomiting -pain, redness, swelling and irritation at site where injected This  list may not describe all possible side effects. Call your doctor for medical advice about side effects. You may report side effects to FDA at 1-800-FDA-1088. Where should I keep my medicine? Keep out of the reach of children. Store in a refrigerator between 2 and 8 degrees C (36 and 46 degrees F). Protect from light. Allow to come to room temperature naturally. Do not use artificial heat. If protected from light, the injection may be stored at room temperature between 20 and 30 degrees C (70 and 86 degrees F) for 14 days. After the initial use, throw away any unused portion of a multiple dose vial after 14 days. Throw away unused portions of the ampules after use. NOTE: This sheet is a summary. It may not cover all possible information. If you have questions about this medicine, talk to your doctor, pharmacist, or health care provider.  2018 [employer]/Gold Standard (2007-08-21 16:56:04)

## 2018-01-23 ENCOUNTER — Other Ambulatory Visit: Payer: Self-pay | Admitting: Family Medicine

## 2018-01-23 MED FILL — METOPROLOL TARTRATE 25 MG T: 25 | 30 days supply | Qty: 30 | Fill #0

## 2018-01-23 MED FILL — TORSEMIDE 20 MG TABLET: 20 | 30 days supply | Qty: 60 | Fill #2

## 2018-01-23 MED FILL — SPIRONOLACTONE 25 MG TABLET: 25 | 30 days supply | Qty: 30 | Fill #1

## 2018-01-24 ENCOUNTER — Telehealth: Payer: Self-pay

## 2018-01-24 NOTE — Telephone Encounter (Signed)
Left a detailed msg concerning appointment. Per 12/13 los. Mailed letter with a calender enclosed

## 2018-01-25 ENCOUNTER — Encounter: Payer: Self-pay | Admitting: Cardiovascular Disease

## 2018-01-25 ENCOUNTER — Ambulatory Visit: Payer: 59 | Admitting: Cardiovascular Disease

## 2018-01-25 VITALS — BP 116/67 | HR 71 | Ht 68.5 in | Wt 229.2 lb

## 2018-01-25 DIAGNOSIS — I10 Essential (primary) hypertension: Secondary | ICD-10-CM

## 2018-01-25 DIAGNOSIS — I77810 Thoracic aortic ectasia: Secondary | ICD-10-CM | POA: Diagnosis not present

## 2018-01-25 DIAGNOSIS — I2510 Atherosclerotic heart disease of native coronary artery without angina pectoris: Secondary | ICD-10-CM | POA: Diagnosis not present

## 2018-01-25 DIAGNOSIS — E785 Hyperlipidemia, unspecified: Secondary | ICD-10-CM

## 2018-01-25 DIAGNOSIS — K7460 Unspecified cirrhosis of liver: Secondary | ICD-10-CM

## 2018-01-25 DIAGNOSIS — I350 Nonrheumatic aortic (valve) stenosis: Secondary | ICD-10-CM

## 2018-01-25 DIAGNOSIS — Z951 Presence of aortocoronary bypass graft: Secondary | ICD-10-CM

## 2018-01-25 NOTE — Progress Notes (Signed)
Patient ID: William [name], male   DOB: January 14, 1951, 67 y.o.   MRN: [id number]    HPI: William [name] is a 67 y.o. male who presents to the office today for a 31-monthcardiology evaluation.  Mr. SHeemstrahas known CAD and underwent CABG revascularization surgery with a LIMA to the LAD, SVG to the RCA, SVG to the circumflex vessel in February 2008.  He has a history of obesity, metabolic syndrome, and mixed hyperlipidemia with preponderance of small dense LDL some particles and increased TG for which he has been on combination therapy.  In December 2013 a nuclear perfusion study was unchanged from 2 years previously and did not show significant scar or ischemia. In January 2014,  I adjusted his antihypertensive medications and started  Benicar HCT 40/25 for optimal blood pressure control with increase diuretic regimen and took him off his Avalide. He has felt improved on this therapy.  According to his wife, his blood pressure at home typically is in the 120s systolially and in the 710Gdiastolically.  He has a history of moderate obesity.  He does not routinely exercise.  He also does note some dietary discretion. He has noticed some mild leg swelling intermittently. He denies recent anginal symptoms. He denies palpitations.   In 2016 he underwent a urologic evaluation for gross hematuria. A CT of his abdomen and pelvis did not reveal any evidence for renal calculi or hydronephrosis or evidence for renal calculi or dilatation. He did have small benign renal cysts. He was also noted a 3.5 cm infrarenal abdominal aortic aneurysm. There was also a suggestion of fatty infiltration of his liver.  He underwent a follow-up abdominal aortic ultrasound on 06/27/2014.  This showed his distal aorta measuring 3.0 x 3.1 cm and he had mild amount of atherosclerosis.  He underwent a  colonoscopy by Dr. JTyrone Sageand was found to have 2 benign polyps.  He has been off  Crestor and niacin since last year due to elevation of  his liver enzymes. There is family history for liver disease in that his mother died from liver disease and his brother had a liver transplant.  In July 2016, his AST was 51 and ALT was 54.  Subsequent blood work by Dr. MCollene Maresrevealed his AST increased at 87 and ALT 68.  On 01/15/2015, his AST was 85 and ALT 64.  At that time, total cholesterol was 244, triglycerides 149, HDL 30, and his LDL cholesterol on statin therapy had risen to 184.   When I saw him in December 2017 he denied chest tightness.  He was not successful with weight loss.  Labs checked by his primary M.D.showed a hemoglobin A1c was 6.6.  LFTs had slightly improved but AST was still elevated at 71 and his ALT was now normal.   He underwent a nuclear perfusion study in March 2018.  This showed normal perfusion.  Ejection fraction was 66% with normal wall motion.  The test was felt to be low risk.  He underwent an echo Doppler study in April 2012 which showed an EF of 60-65% with mild LVH.  There was grade 2 diastolic dysfunction and increased filling pressures.  There was mild aortic stenosis with a mean gradient of 23 and peak gradient of 36, mild aortic root dilatation, mild biatrial enlargement, and mild TR.  He has had recurrent admissions for anemia and GI blood loss.  He has had undergone evaluations by Dr. MCollene Mares and SJacinto Halimand was planning to  see Dr. [name]  at [facility] for further GI evaluation and possible procedures.  He has been intolerant to statin therapy secondary to LFT elevation.    He went to [facility] and had small bowel evaluation.  He has continued to have recurrent GI bleeds.  He has required packed red blood cell transfusions.  2 weeks ago.  His hemoglobin was 9 and hematocrit 28.9, and yesterday hemoglobin was 7.5 with hematocrit of 24.1.  He is scheduled to have another red blood cell transfusion on 01/14/2017.  He has also been undergoing IV iron therapy with his hematologist, Dr. Irene Limbo.  He denies any episodes of chest pain.  His  liver transaminases have been normal, but his bilirubin has increased in the 4 range.   He has now been diagnosed with cirrhosis.  I last saw him in August 2019.  He was getting intravenous iron is no longer on octreotide due to insurance.  He needs to be followed by Dr. Sonnie Alamo for primary care.  Denies episodes of chest tightness.  He denies presyncope or syncope.  In the past he was on Repatha but this was stopped secondary to an increased ammonia level and there was concern that this may have been contributory.  He admitted to mild fatigue and shortness of breath.  Since I last saw him, he underwent a follow-up echo Doppler study in September 2019.  He had mild LVH but on the echo there was no mention made of LV function.  There was moderate aortic valve stenosis with a mean gradient of 19 and peak gradient of 41.  Aortic valve area is 1.1 cm.  He had mild dilatation of his a sending aorta.  Presently he denies any chest pain PND orthopnea.  He is unaware of any tachypalpitations.  He is being followed by the hematology oncology team and was last seen on January 19, 2018.  Ferritin level was 123.  Iron saturation 18% which had improved from 8%.  He had mild renal insufficiency with a creatinine of 1.3.  Hemoglobin was 11.5 with hematocrit of 35.5 which was improved.  He presents for reevaluation.  Past Medical History:  Diagnosis Date  . AAA (abdominal aortic aneurysm) ([place]) 03/2014   3.2 cm on MR abd.  F/u aortic u/s 06/2014 showed 3.0 x 3.1 cm AAA: recheck 2 yrs recommended (followed by cardiologist).  Minimal growth on 10/2016 CT abd done for epig pain.  Repeat u/s 10/2018.  [name] Anemia due to chronic blood loss 2018/19   GI: transfusions x >20 required; multiple endoscopies and bleeding scans unrevealing.  Hemolysis ruled out by hematologist.  Marshell Levan and octreotide + monthly iron infusions as of 04/2017.  . Bilateral renal cysts    simple (03/2014 MRI)  . CAD (coronary artery disease)   .  Cholelithiases 2018   asymptomatic  . Chronic diastolic heart failure ([place])   . Cirrhosis ([place]) 05/2016   secondary to NASH; most recent u/s abd 07/2016 showed splenomegaly.  Hx of portal HTN changes with mild ascites and splenomegaly.  . Diabetes mellitus with complication ([place]) 56/2130   A1c 6.8%  . History of blood transfusion 2018 X 4 dates   "low blood" (09/21/2016)  . Hyperlipemia, mixed    elevated LFTs when on statins.    . Hypertension    Cr bump 04/01/16 so I changed benicar-hct to benicar plain and added amlodipine 5 mg.  . Iron deficiency anemia 05/2016   Acute blood loss anemia: hospitalized, required transfusion x 3  U: colonoscopy and capsule study unrevealing.  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  W/u for ? hemolytic anemia to be pursued by hematologist as outpt.  Dr. [name], GI at [facility] following, too---he rec'd onc do bone marrow bx as of Jan 2019  . Lung field abnormal finding on examination    Bibasilar L>R mild insp crackles-->x-ray showed mild interstitial changes/fibrosis/scarring.  Changes noted on all CXRs in 2018/2019.  . Microscopic hematuria    Eval unremarkable by Dr. Eulogio Ditch.  [name] NASH (nonalcoholic steatohepatitis)    Fatty liver on MR abd 03/2014, + hx of elevated transaminases and bili: followed by Dr. [name].  . Obesity   . Spleen enlarged     Past Surgical History:  Procedure Laterality Date  . CARDIAC CATHETERIZATION    . CARDIOVASCULAR STRESS TEST  01/17/2012; 05/04/16   Normal stress nuclear study x 2 (2018--Normal perfusion. LVEF 66% with normal wall motion. This is a low risk study).  . CERVICAL DISCECTOMY  1992  . COLONOSCOPY N/A 05/27/2016   No site/explanation for blood loss found.  Erythematous mucosa in cecum and ascending colon--Cecal bx: normal.  Procedure: COLONOSCOPY;  Surgeon: Carol Ada, MD;  Location: [facility] ENDOSCOPY;  Service: Endoscopy;  Laterality:  N/A;  . COLONOSCOPY W/ POLYPECTOMY  09/10/2014   Polypectomy x 2: recall 5 yrs (Dr. [name]).  . CORONARY ANGIOPLASTY    . CORONARY ARTERY BYPASS GRAFT  03/2006  . ENTEROSCOPY N/A 07/31/2016   Procedure: ENTEROSCOPY;  Surgeon: Ladene Artist, MD;  Location: [facility] ENDOSCOPY;  Service: Endoscopy;  Laterality: N/A;  . ENTEROSCOPY N/A 12/02/2016   Procedure: ENTEROSCOPY;  Surgeon: Carol Ada, MD;  Location: [facility] ENDOSCOPY;  Service: Endoscopy;  Laterality: N/A;  . ESOPHAGOGASTRODUODENOSCOPY N/A 05/25/2016   No site/explanation for blood loss found.  Procedure: ESOPHAGOGASTRODUODENOSCOPY (EGD);  Surgeon: Juanita Craver, MD;  Location: [facility] ENDOSCOPY;  Service: Endoscopy;  Laterality: N/A;  . ESOPHAGOGASTRODUODENOSCOPY N/A 09/23/2016   Procedure: ESOPHAGOGASTRODUODENOSCOPY (EGD);  Surgeon: Carol Ada, MD;  Location: [place];  Service: Endoscopy;  Laterality: N/A;  . GIVENS CAPSULE STUDY N/A 05/27/2016   No source identified.  Repeat 10/2016--results pending.  Procedure: GIVENS CAPSULE STUDY;  Surgeon: Carol Ada, MD;  Location: [facility] ENDOSCOPY;  Service: Endoscopy;  Laterality: N/A;  . GIVENS CAPSULE STUDY N/A 10/15/2016   Procedure: GIVENS CAPSULE STUDY;  Surgeon: Carol Ada, MD;  Location: [facility] ENDOSCOPY;  Service: Endoscopy;  Laterality: N/A;  . GIVENS CAPSULE STUDY N/A 02/13/2017   Procedure: GIVENS CAPSULE STUDY;  Surgeon: Carol Ada, MD;  Location: [place];  Service: Endoscopy;  Laterality: N/A;  . NECK SURGERY  1991  . NM GI BLOOD LOSS  01/27/2017   NEGATIVE  . SMALL BOWEL ENTEROSCOPY  10/2016   ([facility], Dr. [name]: Double balloon enteroscopy--to the level of proximal ileum) jejunal polyps- which were removed--not bleeding & benign.  No source of bleeding was identified.  . TRANSTHORACIC ECHOCARDIOGRAM  05/25/2016   EF 60%, normal wall motion, grd II DD, mild aortic stenosis, dilated aortic root and ascending aorta.  [name] VASECTOMY  1977    Allergies  Allergen Reactions  . Statins Other (See  Comments)    Liver enzymes go up whenever on them    Current Outpatient Medications  Medication Sig Dispense Refill  . acetaminophen (TYLENOL) 325 MG tablet Take 325 mg by mouth every 6 (six) hours as needed for fever (for fever/pain.).     [name]  cholecalciferol (VITAMIN D) 1000 units tablet Take 1,000 Units by mouth daily.    . fluticasone (FLONASE) 50 MCG/ACT nasal spray Place 2 sprays into both nostrils daily. 16 g 6  . lactulose (CHRONULAC) 10 GM/15ML solution Take 45 mLs (30 g total) by mouth 3 (three) times daily. Take this until you have three BM's a day. 240 mL 0  . metoprolol tartrate (LOPRESSOR) 25 MG tablet TAKE 1/2 TABLET BY MOUTH TWICE A DAY 30 tablet 5  . ondansetron (ZOFRAN) 4 MG tablet Take 1 tablet (4 mg total) by mouth every 8 (eight) hours as needed for nausea or vomiting. 10 tablet 1  . pantoprazole (PROTONIX) 40 MG tablet TAKE 1 TABLET BY MOUTH TWICE DAILY BEFORE A MEAL 60 tablet 3  . potassium chloride SA (K-DUR,KLOR-CON) 20 MEQ tablet TAKE 1 TABLET (20 MEQ TOTAL) BY MOUTH TWICE A DAY 60 tablet 6  . spironolactone (ALDACTONE) 25 MG tablet Take 12.5 mg by mouth daily.    [name] torsemide (DEMADEX) 20 MG tablet TAKE 2 TABLETS BY MOUTH DAILY. 60 tablet 3  . tranexamic acid (LYSTEDA) 650 MG TABS tablet TAKE 2 TABLETS BY MOUTH 3 TIMES DAILY. 180 tablet 1   No current facility-administered medications for this visit.    Facility-Administered Medications Ordered in Other Visits  Medication Dose Route Frequency Provider Last Rate Last Dose  . heparin lock flush 100 unit/mL  500 Units Intracatheter Daily PRN Truitt Merle, MD      . sodium chloride flush (NS) 0.9 % injection 10 mL  10 mL Intracatheter PRN Truitt Merle, MD        Socially he's married has 3 children 3 grandchildren. Does not routinely exercise. In the past he had been walking 4 miles on a treadmill but has not done this in some time. There is no tobacco or alcohol use.  ROS General: Negative; No fevers, chills, or night  sweats;  HEENT: Negative; No changes in vision or hearing, sinus congestion, difficulty swallowing Pulmonary: Negative; No cough, wheezing, shortness of breath, hemoptysis Cardiovascular: Negative; No chest pain, presyncope, syncope, palpitations Positive for Increasing leg swelling GI: Positive for polyps on colonoscopy; positive for fatty liver; recent GI bleeding and symptomatic anemia, cirrhosis GU: Negative; No dysuria, hematuria, or difficulty voiding Musculoskeletal: Negative; no myalgias, joint pain, or weakness Hematologic/Oncology: Negative; no easy bruising, bleeding Endocrine: Positive for metabolic syndrome/mild diabetes; no heat/cold intolerance; Neuro: Negative; no changes in balance, headaches Skin: Negative; No rashes or skin lesions Psychiatric: Negative; No behavioral problems, depression Sleep: Negative; No snoring, daytime sleepiness, hypersomnolence, bruxism, restless legs, hypnogognic hallucinations, no cataplexy Other comprehensive 14 point system review is negative.   PE BP 116/67   Pulse 71   Ht 5' 8.5" (1.74 m)   Wt 229 lb 3.2 oz (104 kg)   BMI 34.34 kg/m    Repeat blood pressure by me was 118/68.  Wt Readings from Last 3 Encounters:  01/26/18 228 lb (103.4 kg)  01/25/18 229 lb 3.2 oz (104 kg)  01/19/18 233 lb 14.4 oz (106.1 kg)     Physical Exam BP 116/67   Pulse 71   Ht 5' 8.5" (1.74 m)   Wt 229 lb 3.2 oz (104 kg)   BMI 34.34 kg/m  General: Alert, oriented, no distress.  Skin: normal turgor, no rashes, warm and dry HEENT: Normocephalic, atraumatic. Pupils equal round and reactive to light; sclera anicteric; extraocular muscles intact; Fundi ** Nose without nasal septal hypertrophy Mouth/Parynx benign; Mallinpatti scale Neck: No JVD, no  carotid bruits; normal carotid upstroke Lungs: clear to ausculatation and percussion; no wheezing or rales Chest wall: without tenderness to palpitation Heart: PMI not displaced, RRR, s1 s2 normal, 2/6  systolic murmur in the aortic area compatible with his aortic stenosis., no diastolic murmur, no rubs, gallops, thrills, or heaves Abdomen: soft, nontender; no hepatosplenomehaly, BS+; abdominal aorta nontender and not dilated by palpation. Back: no CVA tenderness Pulses 2+ Musculoskeletal: full range of motion, normal strength, no joint deformities Extremities: no clubbing cyanosis or edema, Homan's sign negative  Neurologic: grossly nonfocal; Cranial nerves grossly wnl Psychologic: Normal mood and affect  ECG (independently read by me): Normal sinus rhythm at 71 bpm.  First-degree AV block.  LVH by voltage.  QRS complex V1 V2.  August 2019 ECG (independently read by me): NSR at 71; IRBBB, QS complex V1-08 January 2017 ECG (independently read by me): Normal sinus rhythm at 79 bpm.  Incomplete right bundle branch block.  LVH by voltage.  Nonspecific ST changes.  QTc interval 509 ms.  August 2018 ECG (independently read by me): Normal sinus rhythm at 74 bpm.  Incomplete right bundle branch block.  First-degree AV block with a PR interval 28 ms.  QRS complex V1 V2.  QTc interval 499 ms.  March 2018 ECG (independently read by me): Normal sinus rhythm at 61 bpm.  First degree AV block with a PR interval at 224 ms.  Incomplete right bundle branch block.  December 2017 ECG (independently read by me): Normal sinus rhythm at 63 bpm with first-degree AV block.  PR interval 222 ms.  Incomplete right bundle branch block.  June 2017 ECG (independently read by me): Normal sinus rhythm at 65 bpm with first-degree AV block with a PR interval of 214 ms.  Mild RV conduction delay.  Poor anterior R-wave progression V1 through V4.  December 2016 ECG (independently read by me): Normal sinus rhythm at 66 bpm.  First-degree AV block with a PR interval at 224 ms.  June 2016 ECG (independently read by me): Normal sinus rhythm at 66. First-degree block with PR interval 28 ms.  Incomplete right bundle branch block.   QRS complex V1-3.   October 2015 ECG (independently read by me): Sinus rhythm with first-degree AV block.  Incomplete right bundle branch block.  QTc interval 447 ms; first-degree AV block with a PR interval of 220 ms  03/19/2013 ECG (independently read by me): Normal sinus rhythm at 69 beats per minute. No ectopy. Borderline first-degree block with a PR interval of 216 ms.  Prior ECG of July 18,2014:  Sinus rhythm at 57 beats per minute. Borderline first-degree block with a period of 1214 ms. No significant ST abnormalities.  LABS: BMP Latest Ref Rng & Units 01/19/2018 11/23/2017 09/28/2017  Glucose 70 - 99 mg/dL 185(H) 105(H) 102(H)  BUN 8 - 23 mg/dL _0 Creatinine 0.61 - 1.24 mg/dL 1.30(H) 1.03 1.28(H)  BUN/Creat Ratio 10 - 24 - - -  Sodium 135 - 145 mmol/L 136 136 136  Potassium 3.5 - 5.1 mmol/L 4.0 3.8 3.8  Chloride 98 - 111 mmol/L 105 105 106  CO2 22 - 32 mmol/L 21(L) 23 23  Calcium 8.9 - 10.3 mg/dL 8.4(L) 8.6(L) 8.4(L)   Hepatic Function Latest Ref Rng & Units 01/19/2018 11/23/2017 09/28/2017  Total Protein 6.5 - 8.1 g/dL 6.4(L) 6.2(L) 6.2(L)  Albumin 3.5 - 5.0 g/dL 2.9(L) 2.9(L) 3.0(L)  AST 15 - 41 U/L 38 42(H) 38  ALT 0 - 44 U/L 25  30 30  Alk Phosphatase 38 - 126 U/L 144(H) 130(H) 113  Total Bilirubin 0.3 - 1.2 mg/dL 6.2(HH) 5.8(HH) 4.8(HH)  Bilirubin, Direct 0.1 - 0.5 mg/dL - - -   CBC Latest Ref Rng & Units 01/19/2018 12/21/2017 11/23/2017  WBC 4.0 - 10.5 K/uL 5.5 4.3 5.1  Hemoglobin 13.0 - 17.0 g/dL 11.5(L) 10.3(L) 9.8(L)  Hematocrit 39.0 - 52.0 % 35.5(L) 32.3(L) 30.6(L)  Platelets 150 - 400 K/uL 86(L) 102(L) 102(L)   Lab Results  Component Value Date   MCV 91.5 01/19/2018   MCV 89.2 12/21/2017   MCV 89.5 11/23/2017   Lab Results  Component Value Date   TSH 1.19 05/04/2016   Lab Results  Component Value Date   HGBA1C 4.7 01/26/2018   Lipid Panel     Component Value Date/Time   CHOL 162 01/25/2017 0000   CHOL 135 11/29/2013 0940   TRIG 76  01/25/2017 0000   TRIG 177 (H) 11/29/2013 0940   HDL 47 01/25/2017 0000   HDL 34 (L) 11/29/2013 0940   CHOLHDL 3.4 01/25/2017 0000   CHOLHDL 4 09/29/2016 1118   VLDL 13.8 09/29/2016 1118   LDLCALC 100 (H) 01/25/2017 0000   LDLCALC 66 11/29/2013 0940     RADIOLOGY: No results found.  IMPRESSION:  1. Essential hypertension   2. Coronary artery disease involving native coronary artery of native heart without angina pectoris   3. Hx of CABG   4. Moderate aortic stenosis   5. Aortic root dilatation (H[facility])   6. Cirrhosis of liver without ascites, unspecified hepatic cirrhosis type ([place])   7. Hyperlipidemia with target LDL less than 70     ASSESSMENT AND PLAN: Mr. Gumina is a 67 year old gentleman with a history of moderate obesity, who underwent CABG revascularization surgery x4 in February 2008 . His nuclear perfusion study in December 2013 showed fairly normal perfusion without scar or ischemia. A five-year follow-up study in March 2018 continued to be low risk demonstrating normal perfusion and function.. He has significant mixed hyperlipidemia and in the past has been documented to have a preponderance of small dense LDL particles and significantly increased triglycerides. Remotely he initially tolerated combination therapy with Niaspan 2000 mg and Crestor 20 mg without side effects.  However, he subsequently developed LFT elevation and statin and niacin were discontinued.  Subsequent liver function studies normalized.  However, he was diagnosed recently with cirrhosis.  He had been on therapy with Repatha but this was also discontinued due to an elevated ammonia level.  Presently, he is without anginal symptomatology with reference to his CAD.  His blood pressure today is stable and he continues to be on torsemide 40 mg daily, spironolactone 12.5 mg and metoprolol 12.5 mg twice a day.  His ECG remained stable with mild first-degree AV block.  He has been undergoing treatment for iron  therapy and is followed by the hematology oncology division.  He is also on lactulose for his cirrhosis.  He admits to mild shortness of breath.  There is no presyncope or syncope.  His aortic stenosis is felt to be of moderate severity without significant change from his prior echo with a slight reduction in his mean gradient currently 19 when previously at 23.  There is mild aortic root dilatation.  He will continue current therapy.  I will see him in 6 months for reevaluation.  Time spent: 25 minutes Troy Sine, MD, [facility]  01/27/2018 10:00 AM

## 2018-01-25 NOTE — Patient Instructions (Signed)

## 2018-01-26 ENCOUNTER — Encounter: Payer: Self-pay | Admitting: Family Medicine

## 2018-01-26 ENCOUNTER — Ambulatory Visit (INDEPENDENT_AMBULATORY_CARE_PROVIDER_SITE_OTHER): Payer: 59 | Admitting: Family Medicine

## 2018-01-26 VITALS — BP 119/68 | HR 68 | Temp 98.2°F | Resp 16 | Ht 68.5 in | Wt 228.0 lb

## 2018-01-26 DIAGNOSIS — K7460 Unspecified cirrhosis of liver: Secondary | ICD-10-CM | POA: Diagnosis not present

## 2018-01-26 DIAGNOSIS — E7800 Pure hypercholesterolemia, unspecified: Secondary | ICD-10-CM

## 2018-01-26 DIAGNOSIS — K922 Gastrointestinal hemorrhage, unspecified: Secondary | ICD-10-CM | POA: Diagnosis not present

## 2018-01-26 DIAGNOSIS — K7581 Nonalcoholic steatohepatitis (NASH): Secondary | ICD-10-CM

## 2018-01-26 DIAGNOSIS — D509 Iron deficiency anemia, unspecified: Secondary | ICD-10-CM | POA: Diagnosis not present

## 2018-01-26 DIAGNOSIS — I10 Essential (primary) hypertension: Secondary | ICD-10-CM

## 2018-01-26 DIAGNOSIS — E119 Type 2 diabetes mellitus without complications: Secondary | ICD-10-CM | POA: Diagnosis not present

## 2018-01-26 LAB — POCT GLYCOSYLATED HEMOGLOBIN (HGB A1C): Hemoglobin A1C: 4.7 % (ref 4.0–5.6)

## 2018-01-26 NOTE — Progress Notes (Signed)
OFFICE VISIT  02/04/2018   CC:  Chief Complaint  Patient presents with  . Follow-up    RCI   HPI:    Patient is a 67 y.o. Caucasian male who presents for 6 mo f/u DM 2, HTN, HLD. He has chronic GI blood loss anemia.  Unfortunately, extensive work-up with bleeding scans and repeated endoscopies have not revealed a source to explain the bleeding. Hem/onc and GI have been working together to keep bleeding to a minimum and to give iron and or pRBC transfusion as necessary. He has chronic diastolic HF as well as cirrhosis secondary to NASH.  Iron def anem from chronic GI blood loss: he got an injection of ferric carboxymaltose on 01/19/18. At that time his Hb was 11.5. He has been on lysteda and has now restarted sandostatin injections back about 2 mo ago.  Continues to get periodic iron infusions. His last pRBC transfusion was about 10 months ago! Hb about 1 week ago excellent.  HLD: intol of statins OR had LFT elevations on some as well, repatha was helping but was d/c'd due to worry about it causing some increase in ammonia level. He is followed by Dr. Claiborne Billings in cardiology.  OVERALL he feels like he is doing better last few months. Still a bit SOB after lots of activity.   No LE swelling lately, no abd distention.  No abd pain.  No signif pale skin noted by pt.  No jaundice noted.  DM: not monitoring glucoses much at home, and he can't recall any numbers. Last A1c we did on him was 03/2017 at which time he had still been getting regular blood transfusions, so his A1c reading was likely not very accurate.  Past Medical History:  Diagnosis Date  . AAA (abdominal aortic aneurysm) ([place]) 03/2014   3.2 cm on MR abd.  F/u aortic u/s 06/2014 showed 3.0 x 3.1 cm AAA: recheck 2 yrs recommended (followed by cardiologist).  Minimal growth on 10/2016 CT abd done for epig pain.  Repeat u/s 10/2018.  [name] Anemia due to chronic blood loss 2018/19   GI: transfusions x >20 required; multiple endoscopies  and bleeding scans unrevealing.  Hemolysis ruled out by hematologist.  Marshell Levan and octreotide + monthly iron infusions as of 04/2017.  . Bilateral renal cysts    simple (03/2014 MRI)  . CAD (coronary artery disease)   . Cholelithiases 2018   asymptomatic  . Chronic diastolic heart failure ([place])   . Cirrhosis ([place]) 05/2016   secondary to NASH; most recent u/s abd 07/2016 showed splenomegaly.  Hx of portal HTN changes with mild ascites and splenomegaly.  On lactulose as of 2019.  . Diabetes mellitus with complication ([place]) 17/5102   A1c 6.8%  . History of blood transfusion 2018 X 4 dates   "low blood" (09/21/2016)  . Hyperlipemia, mixed    elevated LFTs when on statins.    . Hypertension    Cr bump 04/01/16 so I changed benicar-hct to benicar plain and added amlodipine 5 mg.  . Iron deficiency anemia 05/2016   Acute blood loss anemia: hospitalized, required transfusion x 3 U: colonoscopy and capsule study unrevealing.  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  W/u for ? hemolytic anemia to be pursued by hematologist as outpt.  Dr. [name], GI at [facility] following, too---he rec'd onc do bone marrow bx as of Jan 2019  . Lung field abnormal finding on  examination    Bibasilar L>R mild insp crackles-->x-ray showed mild interstitial changes/fibrosis/scarring.  Changes noted on all CXRs in 2018/2019.  . Microscopic hematuria    Eval unremarkable by Dr. Eulogio Ditch.  [name] NASH (nonalcoholic steatohepatitis)    Fatty liver on MR abd 03/2014, + hx of elevated transaminases and bili: followed by Dr. [name].  . Obesity   . Spleen enlarged     Past Surgical History:  Procedure Laterality Date  . CARDIAC CATHETERIZATION    . CARDIOVASCULAR STRESS TEST  01/17/2012; 05/04/16   Normal stress nuclear study x 2 (2018--Normal perfusion. LVEF 66% with normal wall motion. This is a low risk study).  . CERVICAL DISCECTOMY  1992  .  COLONOSCOPY N/A 05/27/2016   No site/explanation for blood loss found.  Erythematous mucosa in cecum and ascending colon--Cecal bx: normal.  Procedure: COLONOSCOPY;  Surgeon: Carol Ada, MD;  Location[facility] END[facility]COPY;  Service: Endoscopy;  Laterality: N/A;  . COLONOSCOPY W/ POLYPECTOMY  09/10/2014   Polypectomy x 2: recall 5 yrs (Dr. [name]).  . CORONARY ANGIOPLASTY    . CORONARY ARTERY BYPASS GRAFT  03/2006  . ENTEROSCOPY N/A 07/31/2016   Procedure: ENTEROSCOPY;  Surgeon: Ladene Artist, MD;  Location: [facility] ENDOSCOPY;  Service: Endoscopy;  Laterality: N/A;  . ENTEROSCOPY N/A 12/02/2016   Procedure: ENTEROSCOPY;  Surgeon: Carol Ada, MD;  Location: [facility] ENDOSCOPY;  Service: Endoscopy;  Laterality: N/A;  . ESOPHAGOGASTRODUODENOSCOPY N/A 05/25/2016   No site/explanation for blood loss found.  Procedure: ESOPHAGOGASTRODUODENOSCOPY (EGD);  Surgeon: Juanita Craver, MD;  Location: [facility] ENDOSCOPY;  Service: Endoscopy;  Laterality: N/A;  . ESOPHAGOGASTRODUODENOSCOPY N/A 09/23/2016   Procedure: ESOPHAGOGASTRODUODENOSCOPY (EGD);  Surgeon: Carol Ada, MD;  Location: [place];  Service: Endoscopy;  Laterality: N/A;  . GIVENS CAPSULE STUDY N/A 05/27/2016   No source identified.  Repeat 10/2016--results pending.  Procedure: GIVENS CAPSULE STUDY;  Surgeon: Carol Ada, MD;  Location[facility] ENDOSCOPY;  Service: Endoscopy;  Laterality: N/A;  . GIVENS CAPSULE STUDY N/A 10/15/2016   Procedure: GIVENS CAPSULE STUDY;  Surgeon: Carol Ada, MD;  Location: [facility] ENDOSCOPY;  Service: Endoscopy;  Laterality: N/A;  . GIVENS CAPSULE STUDY N/A 02/13/2017   Procedure: GIVENS CAPSULE STUDY;  Surgeon: Carol Ada, MD;  Location: [place];  Service: Endoscopy;  Laterality: N/A;  . NECK SURGERY  1991  . NM GI BLOOD LOSS  01/27/2017   NEGATIVE  . SMALL BOWEL ENTEROSCOPY  10/2016   ([facility], Dr. [name]: Double balloon enteroscopy--to the level of proximal ileum) jejunal polyps- which were removed--not bleeding & benign.  No source of  bleeding was identified.  . TRANSTHORACIC ECHOCARDIOGRAM  05/25/2016   EF 60%, normal wall motion, grd II DD, mild aortic stenosis, dilated aortic root and ascending aorta.  [name] VASECTOMY  1977    Outpatient Medications Prior to Visit  Medication Sig Dispense Refill  . acetaminophen (TYLENOL) 325 MG tablet Take 325 mg by mouth every 6 (six) hours as needed for fever (for fever/pain.).     [name] cholecalciferol (VITAMIN D) 1000 units tablet Take 1,000 Units by mouth daily.    . fluticasone (FLONASE) 50 MCG/ACT nasal spray Place 2 sprays into both nostrils daily. 16 g 6  . lactulose (CHRONULAC) 10 GM/15ML solution Take 45 mLs (30 g total) by mouth 3 (three) times daily. Take this until you have three BM's a day. 240 mL 0  . metoprolol tartrate (LOPRESSOR) 25 MG tablet TAKE 1/2 TABLET BY MOUTH TWICE A DAY 30 tablet 5  . ondansetron (ZOFRAN) 4  MG tablet Take 1 tablet (4 mg total) by mouth every 8 (eight) hours as needed for nausea or vomiting. 10 tablet 1  . pantoprazole (PROTONIX) 40 MG tablet TAKE 1 TABLET BY MOUTH TWICE DAILY BEFORE A MEAL 60 tablet 3  . potassium chloride SA (K-DUR,KLOR-CON) 20 MEQ tablet TAKE 1 TABLET (20 MEQ TOTAL) BY MOUTH TWICE A DAY 60 tablet 6  . spironolactone (ALDACTONE) 25 MG tablet Take 12.5 mg by mouth daily.    [name] torsemide (DEMADEX) 20 MG tablet TAKE 2 TABLETS BY MOUTH DAILY. 60 tablet 3  . tranexamic acid (LYSTEDA) 650 MG TABS tablet TAKE 2 TABLETS BY MOUTH 3 TIMES DAILY. 180 tablet 1   Facility-Administered Medications Prior to Visit  Medication Dose Route Frequency Provider Last Rate Last Dose  . heparin lock flush 100 unit/mL  500 Units Intracatheter Daily PRN Truitt Merle, MD      . sodium chloride flush (NS) 0.9 % injection 10 mL  10 mL Intracatheter PRN Truitt Merle, MD        Allergies  Allergen Reactions  . Statins Other (See Comments)    Liver enzymes go up whenever on them    ROS As per HPI  PE: Blood pressure 119/68, pulse 68, temperature 98.2 F (36.8  C), temperature source Oral, resp. rate 16, height 5' 8.5" (1.74 m), weight 228 lb (103.4 kg), SpO2 92 %. Gen: Alert, well appearing.  Patient is oriented to person, place, time, and situation. AFFECT: pleasant, lucid thought and speech. MYT:RZNB: no injection, icteris, swelling, or exudate.  EOMI, PERRLA. Mouth: lips without lesion/swelling.  Oral mucosa pink and moist. Oropharynx without erythema, exudate, or swelling.  CV: RRR, 2/6 syst murmur, S1 obscured but S2 clear.  No diastolic murmur.  No r/g.  No ectopy. LUNGS: CTA bilat except very faint end-insp crackles in both bases which is chronic for him/normal for him.  Nonlabored resps.  Good aeration. ABD: soft, NT/ND EXT: no clubbing or cyanosis.  no edema.    LABS:  Lab Results  Component Value Date   TSH 1.19 05/04/2016   Lab Results  Component Value Date   WBC 5.5 01/19/2018   HGB 11.5 (L) 01/19/2018   HCT 35.5 (L) 01/19/2018   MCV 91.5 01/19/2018   PLT 86 (L) 01/19/2018   Lab Results  Component Value Date   CREATININE 1.30 (H) 01/19/2018   BUN 13 01/19/2018   NA 136 01/19/2018   K 4.0 01/19/2018   CL 105 01/19/2018   CO2 21 (L) 01/19/2018   Lab Results  Component Value Date   ALT 25 01/19/2018   AST 38 01/19/2018   ALKPHOS 144 (H) 01/19/2018   BILITOT 6.2 (HH) 01/19/2018   Lab Results  Component Value Date   CHOL 162 01/25/2017   Lab Results  Component Value Date   HDL 47 01/25/2017   Lab Results  Component Value Date   LDLCALC 100 (H) 01/25/2017   Lab Results  Component Value Date   TRIG 76 01/25/2017   Lab Results  Component Value Date   CHOLHDL 3.4 01/25/2017   Lab Results  Component Value Date   PSA 0.31 06/29/2016   Lab Results  Component Value Date   HGBA1C 4.7 01/26/2018   POC A1c today was 4.7%  IMPRESSION AND PLAN:  1) DM 2: good control. Continue diet and increased activity level.  2) HTN: The current medical regimen is effective;  continue present plan and  medications. Recent lytes 1 week ago normal,  GFR borderline stage II/III CRI.  3) HLD: elevated LFTs on statins.  Repatha recently ? Causing increased ammonia so he was taken off of this. Currently on no med for this.  Work on [employer].  4) Chronic iron def anemia/chronic GI bleeding from unknown location despite extensive search. Managed excellently by hem/onc and GI. Has not required a transfusion in about 9-10 months. He continues to get lysteda, octreotide, and periodic iron infusions. Hb 1 wk ago was 22.6.  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 Labs 1 week ago stable.  He is on lactulose, demedex, aldactone, and lopressor.  An After Visit Summary was printed and given to the patient.  FOLLOW UP: Return in about 6 months (around 07/28/2018) for annual CPE (fasting).  Signed:  Crissie Sickles, MD           02/04/2018

## 2018-01-27 ENCOUNTER — Encounter: Payer: Self-pay | Admitting: Cardiovascular Disease

## 2018-01-28 ENCOUNTER — Encounter: Payer: Self-pay | Admitting: Family Medicine

## 2018-02-05 MED FILL — GENERLAC 10 GM/15 ML SOLN: 10 | 30 days supply | Qty: 2700 | Fill #5

## 2018-02-07 DIAGNOSIS — K7290 Hepatic failure, unspecified without coma: Secondary | ICD-10-CM

## 2018-02-07 HISTORY — DX: Hepatic failure, unspecified without coma: K72.90

## 2018-02-13 MED FILL — PANTOPRAZOLE SOD DR 40 MG T: 40 | 30 days supply | Qty: 60 | Fill #2

## 2018-02-13 MED FILL — POTASSIUM CHLORIDE CRYS ER: 20 | 30 days supply | Qty: 60 | Fill #1

## 2018-02-15 ENCOUNTER — Inpatient Hospital Stay: Payer: 59 | Attending: Hematology

## 2018-02-15 ENCOUNTER — Inpatient Hospital Stay: Payer: 59

## 2018-02-15 VITALS — BP 100/59 | HR 55 | Temp 97.8°F | Resp 16

## 2018-02-15 DIAGNOSIS — D500 Iron deficiency anemia secondary to blood loss (chronic): Secondary | ICD-10-CM

## 2018-02-15 DIAGNOSIS — K922 Gastrointestinal hemorrhage, unspecified: Secondary | ICD-10-CM | POA: Insufficient documentation

## 2018-02-15 LAB — CMP (CANCER CENTER ONLY)
ALT: 31 U/L (ref 0–44)
AST: 43 U/L — AB (ref 15–41)
Albumin: 2.9 g/dL — ABNORMAL LOW (ref 3.5–5.0)
Alkaline Phosphatase: 129 U/L — ABNORMAL HIGH (ref 38–126)
Anion gap: 9 (ref 5–15)
BUN: 11 mg/dL (ref 8–23)
CO2: 22 mmol/L (ref 22–32)
Calcium: 8.4 mg/dL — ABNORMAL LOW (ref 8.9–10.3)
Chloride: 107 mmol/L (ref 98–111)
Creatinine: 1.15 mg/dL (ref 0.61–1.24)
GFR, Est AFR Am: >60 mL/min (ref >60)
GFR, Estimated: >60 mL/min (ref >60)
Glucose, Bld: 110 mg/dL — ABNORMAL HIGH (ref 70–99)
Potassium: 3.8 mmol/L (ref 3.5–5.1)
Sodium: 138 mmol/L (ref 135–145)
Total Bilirubin: 6 mg/dL (ref 0.3–1.2)
Total Protein: 6.4 g/dL — ABNORMAL LOW (ref 6.5–8.1)

## 2018-02-15 LAB — CBC WITH DIFFERENTIAL (CANCER CENTER ONLY)
Abs Immature Granulocytes: 0.03 10*3/uL (ref 0.00–0.07)
Basophils Absolute: 0.1 10*3/uL (ref 0.0–0.1)
Basophils Relative: 2 %
Eosinophils Absolute: 0.7 10*3/uL — ABNORMAL HIGH (ref 0.0–0.5)
Eosinophils Relative: 12 %
HCT: 35.2 % — ABNORMAL LOW (ref 39.0–52.0)
Hemoglobin: 11.7 g/dL — ABNORMAL LOW (ref 13.0–17.0)
IMMATURE GRANULOCYTES: 1 %
Lymphocytes Relative: 13 %
Lymphs Abs: 0.8 10*3/uL (ref 0.7–4.0)
MCH: 30.5 pg (ref 26.0–34.0)
MCHC: 33.2 g/dL (ref 30.0–36.0)
MCV: 91.9 fL (ref 80.0–100.0)
Monocytes Absolute: 0.8 10*3/uL (ref 0.1–1.0)
Monocytes Relative: 14 %
NEUTROS PCT: 58 %
Neutro Abs: 3.5 10*3/uL (ref 1.7–7.7)
Platelet Count: 85 10*3/uL — ABNORMAL LOW (ref 150–400)
RBC: 3.83 MIL/uL — ABNORMAL LOW (ref 4.22–5.81)
RDW: 19.1 % — ABNORMAL HIGH (ref 11.5–15.5)
WBC: 5.9 10*3/uL (ref 4.0–10.5)
nRBC: 0 % (ref 0.0–0.2)

## 2018-02-15 LAB — RETICULOCYTES
Immature Retic Fract: 15 % (ref 2.3–15.9)
RBC.: 3.83 MIL/uL — ABNORMAL LOW (ref 4.22–5.81)
Retic Count, Absolute: 115.3 10*3/uL (ref 19.0–186.0)
Retic Ct Pct: 3 % (ref 0.4–3.1)

## 2018-02-15 LAB — IRON AND TIBC
Iron: 44 ug/dL (ref 42–163)
Saturation Ratios: 14 % — ABNORMAL LOW (ref 20–55)
TIBC: 319 ug/dL (ref 202–409)
UIBC: 275 ug/dL (ref 117–376)

## 2018-02-15 LAB — FERRITIN: Ferritin: 147 ng/mL (ref 24–336)

## 2018-02-15 MED ORDER — SODIUM CHLORIDE 0.9 % IV SOLN
750.0000 mg | Freq: Once | INTRAVENOUS | Status: AC
  Administered 2018-02-15: 750 mg via INTRAVENOUS
  Filled 2018-02-15: qty 15

## 2018-02-15 MED ORDER — OCTREOTIDE ACETATE 30 MG IM KIT
PACK | INTRAMUSCULAR | Status: AC
  Filled 2018-02-15: qty 1

## 2018-02-15 MED ORDER — SODIUM CHLORIDE 0.9 % IV SOLN
INTRAVENOUS | Status: DC
  Administered 2018-02-15: 10:00:00 via INTRAVENOUS
  Filled 2018-02-15: qty 250

## 2018-02-15 MED ORDER — OCTREOTIDE ACETATE 30 MG IM KIT
30.0000 mg | PACK | Freq: Once | INTRAMUSCULAR | Status: AC
  Administered 2018-02-15: 30 mg via INTRAMUSCULAR

## 2018-02-15 NOTE — Patient Instructions (Signed)
Ferric carboxymaltose injection What is this medicine? FERRIC CARBOXYMALTOSE (ferr-ik car-box-ee-mol-toes) is an iron complex. Iron is used to make healthy red blood cells, which carry oxygen and nutrients throughout the body. This medicine is used to treat anemia in people with chronic kidney disease or people who cannot take iron by mouth. This medicine may be used for other purposes; ask your health care provider or pharmacist if you have questions. COMMON BRAND NAME(S): Injectafer What should I tell my health care provider before I take this medicine? They need to know if you have any of these conditions: -high levels of iron in the blood -liver disease -an unusual or allergic reaction to iron, other medicines, foods, dyes, or preservatives -pregnant or trying to get pregnant -breast-feeding How should I use this medicine? This medicine is for infusion into a vein. It is given by a health care professional in a hospital or clinic setting. Talk to your pediatrician regarding the use of this medicine in children. Special care may be needed. Overdosage: If you think you have taken too much of this medicine contact a poison control center or emergency room at once. NOTE: This medicine is only for you. Do not share this medicine with others. What if I miss a dose? It is important not to miss your dose. Call your doctor or health care professional if you are unable to keep an appointment. What may interact with this medicine? Do not take this medicine with any of the following medications: -deferoxamine -dimercaprol -other iron products This list may not describe all possible interactions. Give your health care provider a list of all the medicines, herbs, non-prescription drugs, or dietary supplements you use. Also tell them if you smoke, drink alcohol, or use illegal drugs. Some items may interact with your medicine. What should I watch for while using this medicine? Visit your doctor or  health care professional regularly. Tell your doctor if your symptoms do not start to get better or if they get worse. You may need blood work done while you are taking this medicine. You may need to follow a special diet. Talk to your doctor. Foods that contain iron include: whole grains/cereals, dried fruits, beans, or peas, leafy green vegetables, and organ meats (liver, kidney). What side effects may I notice from receiving this medicine? Side effects that you should report to your doctor or health care professional as soon as possible: -allergic reactions like skin rash, itching or hives, swelling of the face, lips, or tongue -dizziness -facial flushing Side effects that usually do not require medical attention (report to your doctor or health care professional if they continue or are bothersome): -changes in taste -constipation -headache -nausea, vomiting -pain, redness, or irritation at site where injected This list may not describe all possible side effects. Call your doctor for medical advice about side effects. You may report side effects to FDA at 1-800-FDA-1088. Where should I keep my medicine? This drug is given in a hospital or clinic and will not be stored at home. NOTE: This sheet is a summary. It may not cover all possible information. If you have questions about this medicine, talk to your doctor, pharmacist, or health care provider.  2019 Elsevier/Gold Standard (2016-03-10 09:40:29)  Octreotide injection solution What is this medicine? OCTREOTIDE (ok TREE oh tide) is used to reduce blood levels of growth hormone in patients with a condition called acromegaly. This medicine also reduces flushing and watery diarrhea caused by certain types of cancer. This medicine may be  used for other purposes; ask your health care provider or pharmacist if you have questions. COMMON BRAND NAME(S): Sandostatin, Sandostatin LAR What should I tell my health care provider before I take this  medicine? They need to know if you have any of these conditions: -gallbladder disease -kidney disease -liver disease -an unusual or allergic reaction to octreotide, other medicines, foods, dyes, or preservatives -pregnant or trying to get pregnant -breast-feeding How should I use this medicine? This medicine is for injection under the skin or into a vein (only in emergency situations). It is usually given by a health care professional in a hospital or clinic setting. If you get this medicine at home, you will be taught how to prepare and give this medicine. Allow the injection solution to come to room temperature before use. Do not warm it artificially. Use exactly as directed. Take your medicine at regular intervals. Do not take your medicine more often than directed. It is important that you put your used needles and syringes in a special sharps container. Do not put them in a trash can. If you do not have a sharps container, call your pharmacist or healthcare provider to get one. Talk to your pediatrician regarding the use of this medicine in children. Special care may be needed. Overdosage: If you think you have taken too much of this medicine contact a poison control center or emergency room at once. NOTE: This medicine is only for you. Do not share this medicine with others. What if I miss a dose? If you miss a dose, take it as soon as you can. If it is almost time for your next dose, take only that dose. Do not take double or extra doses. What may interact with this medicine? Do not take this medicine with any of the following medications: -cisapride -droperidol -general anesthetics -grepafloxacin -perphenazine -thioridazine This medicine may also interact with the following medications: -bromocriptine -cyclosporine -diuretics -medicines for blood pressure, heart disease, irregular heart beat -medicines for diabetes, including insulin -quinidine This list may not describe all  possible interactions. Give your health care provider a list of all the medicines, herbs, non-prescription drugs, or dietary supplements you use. Also tell them if you smoke, drink alcohol, or use illegal drugs. Some items may interact with your medicine. What should I watch for while using this medicine? Visit your doctor or health care professional for regular checks on your progress. To help reduce irritation at the injection site, use a different site for each injection and make sure the solution is at room temperature before use. This medicine may cause increases or decreases in blood sugar. Signs of high blood sugar include frequent urination, unusual thirst, flushed or dry skin, difficulty breathing, drowsiness, stomach ache, nausea, vomiting or dry mouth. Signs of low blood sugar include chills, cool, pale skin or cold sweats, drowsiness, extreme hunger, fast heartbeat, headache, nausea, nervousness or anxiety, shakiness, trembling, unsteadiness, tiredness, or weakness. Contact your doctor or health care professional right away if you experience any of these symptoms. This medicine may cause a decrease in vitamin B12. You should make sure that you get enough vitamin B12 while you are taking this medicine. Discuss the foods you eat and the vitamins you take with your health care professional. What side effects may I notice from receiving this medicine? Side effects that you should report to your doctor or health care professional as soon as possible: -allergic reactions like skin rash, itching or hives, swelling of the face, lips, or  tongue -changes in blood sugar -changes in heart rate -severe stomach pain Side effects that usually do not require medical attention (report to your doctor or health care professional if they continue or are bothersome): -diarrhea or constipation -gas or stomach pain -nausea, vomiting -pain, redness, swelling and irritation at site where injected This list may  not describe all possible side effects. Call your doctor for medical advice about side effects. You may report side effects to FDA at 1-800-FDA-1088. Where should I keep my medicine? Keep out of the reach of children. Store in a refrigerator between 2 and 8 degrees C (36 and 46 degrees F). Protect from light. Allow to come to room temperature naturally. Do not use artificial heat. If protected from light, the injection may be stored at room temperature between 20 and 30 degrees C (70 and 86 degrees F) for 14 days. After the initial use, throw away any unused portion of a multiple dose vial after 14 days. Throw away unused portions of the ampules after use. NOTE: This sheet is a summary. It may not cover all possible information. If you have questions about this medicine, talk to your doctor, pharmacist, or health care provider.  2019 Elsevier/Gold Standard (2016-09-09 13:24:44)

## 2018-02-15 NOTE — Progress Notes (Signed)
Pt declined to stay for 30 min post-injectafer observation. Vitals stable. Pt ambulated out of clinic without incident.

## 2018-02-23 ENCOUNTER — Other Ambulatory Visit: Payer: Self-pay | Admitting: Hematology

## 2018-02-23 MED FILL — TRANEXAMIC ACID 650 MG TAB: 650 | 30 days supply | Qty: 180 | Fill #0

## 2018-02-23 MED FILL — METOPROLOL TARTRATE 25 MG T: 25 | 30 days supply | Qty: 30 | Fill #1

## 2018-02-23 MED FILL — TORSEMIDE 20 MG TABLET: 20 | 30 days supply | Qty: 60 | Fill #3

## 2018-02-26 DIAGNOSIS — C44629 Squamous cell carcinoma of skin of left upper limb, including shoulder: Secondary | ICD-10-CM | POA: Diagnosis not present

## 2018-03-14 DIAGNOSIS — K922 Gastrointestinal hemorrhage, unspecified: Secondary | ICD-10-CM | POA: Insufficient documentation

## 2018-03-14 DIAGNOSIS — D500 Iron deficiency anemia secondary to blood loss (chronic): Secondary | ICD-10-CM | POA: Diagnosis not present

## 2018-03-15 ENCOUNTER — Inpatient Hospital Stay: Payer: 59 | Attending: Hematology

## 2018-03-15 ENCOUNTER — Inpatient Hospital Stay: Payer: 59

## 2018-03-15 VITALS — BP 109/54 | HR 61 | Temp 97.9°F | Resp 17

## 2018-03-15 DIAGNOSIS — D649 Anemia, unspecified: Secondary | ICD-10-CM

## 2018-03-15 DIAGNOSIS — D500 Iron deficiency anemia secondary to blood loss (chronic): Secondary | ICD-10-CM | POA: Diagnosis not present

## 2018-03-15 DIAGNOSIS — K922 Gastrointestinal hemorrhage, unspecified: Secondary | ICD-10-CM | POA: Diagnosis not present

## 2018-03-15 LAB — SAMPLE TO BLOOD BANK

## 2018-03-15 LAB — IRON AND TIBC
IRON: 51 ug/dL (ref 42–163)
Saturation Ratios: 15 % — ABNORMAL LOW (ref 20–55)
TIBC: 331 ug/dL (ref 202–409)
UIBC: 280 ug/dL (ref 117–376)

## 2018-03-15 LAB — CMP (CANCER CENTER ONLY)
ALT: 39 U/L (ref 0–44)
AST: 50 U/L — ABNORMAL HIGH (ref 15–41)
Albumin: 3 g/dL — ABNORMAL LOW (ref 3.5–5.0)
Alkaline Phosphatase: 136 U/L — ABNORMAL HIGH (ref 38–126)
Anion gap: 7 (ref 5–15)
BUN: 11 mg/dL (ref 8–23)
CO2: 23 mmol/L (ref 22–32)
Calcium: 8.5 mg/dL — ABNORMAL LOW (ref 8.9–10.3)
Chloride: 106 mmol/L (ref 98–111)
Creatinine: 1.09 mg/dL (ref 0.61–1.24)
Glucose, Bld: 101 mg/dL — ABNORMAL HIGH (ref 70–99)
Potassium: 3.7 mmol/L (ref 3.5–5.1)
Sodium: 136 mmol/L (ref 135–145)
Total Bilirubin: 7.8 mg/dL (ref 0.3–1.2)
Total Protein: 6.7 g/dL (ref 6.5–8.1)

## 2018-03-15 LAB — CBC WITH DIFFERENTIAL/PLATELET
Abs Immature Granulocytes: 0.01 10*3/uL (ref 0.00–0.07)
Basophils Absolute: 0.1 10*3/uL (ref 0.0–0.1)
Basophils Relative: 1 %
EOS PCT: 10 %
Eosinophils Absolute: 0.7 10*3/uL — ABNORMAL HIGH (ref 0.0–0.5)
HCT: 35.8 % — ABNORMAL LOW (ref 39.0–52.0)
Hemoglobin: 12.1 g/dL — ABNORMAL LOW (ref 13.0–17.0)
Immature Granulocytes: 0 %
LYMPHS PCT: 14 %
Lymphs Abs: 0.9 10*3/uL (ref 0.7–4.0)
MCH: 31.5 pg (ref 26.0–34.0)
MCHC: 33.8 g/dL (ref 30.0–36.0)
MCV: 93.2 fL (ref 80.0–100.0)
Monocytes Absolute: 1.1 10*3/uL — ABNORMAL HIGH (ref 0.1–1.0)
Monocytes Relative: 16 %
Neutro Abs: 3.9 10*3/uL (ref 1.7–7.7)
Neutrophils Relative %: 59 %
Platelets: 93 10*3/uL — ABNORMAL LOW (ref 150–400)
RBC: 3.84 MIL/uL — ABNORMAL LOW (ref 4.22–5.81)
RDW: 18 % — ABNORMAL HIGH (ref 11.5–15.5)
WBC: 6.7 10*3/uL (ref 4.0–10.5)
nRBC: 0 % (ref 0.0–0.2)

## 2018-03-15 LAB — FERRITIN: Ferritin: 198 ng/mL (ref 24–336)

## 2018-03-15 MED ORDER — SODIUM CHLORIDE 0.9 % IV SOLN
INTRAVENOUS | Status: DC
  Administered 2018-03-15: 10:00:00 via INTRAVENOUS
  Filled 2018-03-15: qty 250

## 2018-03-15 MED ORDER — OCTREOTIDE ACETATE 30 MG IM KIT
30.0000 mg | PACK | Freq: Once | INTRAMUSCULAR | Status: AC
  Administered 2018-03-15: 30 mg via INTRAMUSCULAR

## 2018-03-15 MED ORDER — OCTREOTIDE ACETATE 30 MG IM KIT
PACK | INTRAMUSCULAR | Status: AC
  Filled 2018-03-15: qty 1

## 2018-03-15 MED ORDER — SODIUM CHLORIDE 0.9 % IV SOLN
750.0000 mg | Freq: Once | INTRAVENOUS | Status: AC
  Administered 2018-03-15: 750 mg via INTRAVENOUS
  Filled 2018-03-15: qty 15

## 2018-03-15 NOTE — Patient Instructions (Signed)
Ferric carboxymaltose injection What is this medicine? FERRIC CARBOXYMALTOSE (ferr-ik car-box-ee-mol-toes) is an iron complex. Iron is used to make healthy red blood cells, which carry oxygen and nutrients throughout the body. This medicine is used to treat anemia in people with chronic kidney disease or people who cannot take iron by mouth. This medicine may be used for other purposes; ask your health care provider or pharmacist if you have questions. COMMON BRAND NAME(S): Injectafer What should I tell my health care provider before I take this medicine? They need to know if you have any of these conditions: -high levels of iron in the blood -liver disease -an unusual or allergic reaction to iron, other medicines, foods, dyes, or preservatives -pregnant or trying to get pregnant -breast-feeding How should I use this medicine? This medicine is for infusion into a vein. It is given by a health care professional in a hospital or clinic setting. Talk to your pediatrician regarding the use of this medicine in children. Special care may be needed. Overdosage: If you think you have taken too much of this medicine contact a poison control center or emergency room at once. NOTE: This medicine is only for you. Do not share this medicine with others. What if I miss a dose? It is important not to miss your dose. Call your doctor or health care professional if you are unable to keep an appointment. What may interact with this medicine? Do not take this medicine with any of the following medications: -deferoxamine -dimercaprol -other iron products This list may not describe all possible interactions. Give your health care provider a list of all the medicines, herbs, non-prescription drugs, or dietary supplements you use. Also tell them if you smoke, drink alcohol, or use illegal drugs. Some items may interact with your medicine. What should I watch for while using this medicine? Visit your doctor or  health care professional regularly. Tell your doctor if your symptoms do not start to get better or if they get worse. You may need blood work done while you are taking this medicine. You may need to follow a special diet. Talk to your doctor. Foods that contain iron include: whole grains/cereals, dried fruits, beans, or peas, leafy green vegetables, and organ meats (liver, kidney). What side effects may I notice from receiving this medicine? Side effects that you should report to your doctor or health care professional as soon as possible: -allergic reactions like skin rash, itching or hives, swelling of the face, lips, or tongue -dizziness -facial flushing Side effects that usually do not require medical attention (report to your doctor or health care professional if they continue or are bothersome): -changes in taste -constipation -headache -nausea, vomiting -pain, redness, or irritation at site where injected This list may not describe all possible side effects. Call your doctor for medical advice about side effects. You may report side effects to FDA at 1-800-FDA-1088. Where should I keep my medicine? This drug is given in a hospital or clinic and will not be stored at home. NOTE: This sheet is a summary. It may not cover all possible information. If you have questions about this medicine, talk to your doctor, pharmacist, or health care provider.  2019 Elsevier/Gold Standard (2016-03-10 09:40:29)  Octreotide injection solution What is this medicine? OCTREOTIDE (ok TREE oh tide) is used to reduce blood levels of growth hormone in patients with a condition called acromegaly. This medicine also reduces flushing and watery diarrhea caused by certain types of cancer. This medicine may be  used for other purposes; ask your health care provider or pharmacist if you have questions. COMMON BRAND NAME(S): Sandostatin, Sandostatin LAR What should I tell my health care provider before I take this  medicine? They need to know if you have any of these conditions: -gallbladder disease -kidney disease -liver disease -an unusual or allergic reaction to octreotide, other medicines, foods, dyes, or preservatives -pregnant or trying to get pregnant -breast-feeding How should I use this medicine? This medicine is for injection under the skin or into a vein (only in emergency situations). It is usually given by a health care professional in a hospital or clinic setting. If you get this medicine at home, you will be taught how to prepare and give this medicine. Allow the injection solution to come to room temperature before use. Do not warm it artificially. Use exactly as directed. Take your medicine at regular intervals. Do not take your medicine more often than directed. It is important that you put your used needles and syringes in a special sharps container. Do not put them in a trash can. If you do not have a sharps container, call your pharmacist or healthcare provider to get one. Talk to your pediatrician regarding the use of this medicine in children. Special care may be needed. Overdosage: If you think you have taken too much of this medicine contact a poison control center or emergency room at once. NOTE: This medicine is only for you. Do not share this medicine with others. What if I miss a dose? If you miss a dose, take it as soon as you can. If it is almost time for your next dose, take only that dose. Do not take double or extra doses. What may interact with this medicine? Do not take this medicine with any of the following medications: -cisapride -droperidol -general anesthetics -grepafloxacin -perphenazine -thioridazine This medicine may also interact with the following medications: -bromocriptine -cyclosporine -diuretics -medicines for blood pressure, heart disease, irregular heart beat -medicines for diabetes, including insulin -quinidine This list may not describe all  possible interactions. Give your health care provider a list of all the medicines, herbs, non-prescription drugs, or dietary supplements you use. Also tell them if you smoke, drink alcohol, or use illegal drugs. Some items may interact with your medicine. What should I watch for while using this medicine? Visit your doctor or health care professional for regular checks on your progress. To help reduce irritation at the injection site, use a different site for each injection and make sure the solution is at room temperature before use. This medicine may cause increases or decreases in blood sugar. Signs of high blood sugar include frequent urination, unusual thirst, flushed or dry skin, difficulty breathing, drowsiness, stomach ache, nausea, vomiting or dry mouth. Signs of low blood sugar include chills, cool, pale skin or cold sweats, drowsiness, extreme hunger, fast heartbeat, headache, nausea, nervousness or anxiety, shakiness, trembling, unsteadiness, tiredness, or weakness. Contact your doctor or health care professional right away if you experience any of these symptoms. This medicine may cause a decrease in vitamin B12. You should make sure that you get enough vitamin B12 while you are taking this medicine. Discuss the foods you eat and the vitamins you take with your health care professional. What side effects may I notice from receiving this medicine? Side effects that you should report to your doctor or health care professional as soon as possible: -allergic reactions like skin rash, itching or hives, swelling of the face, lips, or  tongue -changes in blood sugar -changes in heart rate -severe stomach pain Side effects that usually do not require medical attention (report to your doctor or health care professional if they continue or are bothersome): -diarrhea or constipation -gas or stomach pain -nausea, vomiting -pain, redness, swelling and irritation at site where injected This list may  not describe all possible side effects. Call your doctor for medical advice about side effects. You may report side effects to FDA at 1-800-FDA-1088. Where should I keep my medicine? Keep out of the reach of children. Store in a refrigerator between 2 and 8 degrees C (36 and 46 degrees F). Protect from light. Allow to come to room temperature naturally. Do not use artificial heat. If protected from light, the injection may be stored at room temperature between 20 and 30 degrees C (70 and 86 degrees F) for 14 days. After the initial use, throw away any unused portion of a multiple dose vial after 14 days. Throw away unused portions of the ampules after use. NOTE: This sheet is a summary. It may not cover all possible information. If you have questions about this medicine, talk to your doctor, pharmacist, or health care provider.  2019 Elsevier/Gold Standard (2016-09-09 13:24:44)

## 2018-03-15 NOTE — Progress Notes (Signed)
Per Dr. Irene Limbo: pt does not need any transfusion and can remove blue blood bank bracelet. Also stated pt's elevated LFTs are chronic issue for pt. Passed information along to pt and wife, and provided copy of pt's labs from yesterday. Both pt and wife verbalized understanding and agreement.    Pt declined to stay for 30 min post-feraheme observation period. VSS and pt ambulated out of clinic without incident.

## 2018-03-19 DIAGNOSIS — K7460 Unspecified cirrhosis of liver: Secondary | ICD-10-CM | POA: Diagnosis not present

## 2018-03-19 DIAGNOSIS — K7290 Hepatic failure, unspecified without coma: Secondary | ICD-10-CM | POA: Diagnosis not present

## 2018-03-19 DIAGNOSIS — D509 Iron deficiency anemia, unspecified: Secondary | ICD-10-CM | POA: Diagnosis not present

## 2018-03-21 ENCOUNTER — Other Ambulatory Visit: Payer: Self-pay | Admitting: Gastroenterology

## 2018-03-22 ENCOUNTER — Other Ambulatory Visit: Payer: Self-pay | Admitting: Family Medicine

## 2018-03-22 MED FILL — POTASSIUM CHLORIDE CRYS ER: 20 | 30 days supply | Qty: 60 | Fill #2

## 2018-03-22 MED FILL — PANTOPRAZOLE SOD DR 40 MG T: 40 | 30 days supply | Qty: 60 | Fill #3

## 2018-03-22 MED FILL — METOPROLOL TARTRATE 25 MG T: 25 | 30 days supply | Qty: 30 | Fill #2

## 2018-03-22 NOTE — Telephone Encounter (Signed)
RF request for torsemide LOV: 01/26/18 Next ov: 08/02/18 Last written: 11/16/17 #60 w/ 3RF  Please advise. Thanks.

## 2018-03-23 ENCOUNTER — Other Ambulatory Visit: Payer: Self-pay | Admitting: Gastroenterology

## 2018-03-23 DIAGNOSIS — Z8719 Personal history of other diseases of the digestive system: Secondary | ICD-10-CM

## 2018-03-23 MED FILL — TORSEMIDE 20 MG TABLET: 20 | 30 days supply | Qty: 60 | Fill #0

## 2018-03-26 ENCOUNTER — Other Ambulatory Visit: Payer: Self-pay

## 2018-03-26 ENCOUNTER — Encounter: Payer: Self-pay | Admitting: Family Medicine

## 2018-03-26 ENCOUNTER — Emergency Department (HOSPITAL_COMMUNITY): Payer: 59

## 2018-03-26 ENCOUNTER — Inpatient Hospital Stay (HOSPITAL_COMMUNITY): Payer: 59

## 2018-03-26 ENCOUNTER — Inpatient Hospital Stay (HOSPITAL_COMMUNITY)
Admission: EM | Admit: 2018-03-26 | Discharge: 2018-03-31 | DRG: 871 | Disposition: A | Discharge status: Home / Self Care | Payer: 59 | Attending: Family Medicine | Admitting: Family Medicine

## 2018-03-26 ENCOUNTER — Encounter (HOSPITAL_COMMUNITY): Payer: Self-pay | Admitting: Family Medicine

## 2018-03-26 ENCOUNTER — Ambulatory Visit (INDEPENDENT_AMBULATORY_CARE_PROVIDER_SITE_OTHER): Payer: 59 | Admitting: Family Medicine

## 2018-03-26 VITALS — BP 110/67 | HR 81 | Temp 98.8°F | Resp 16 | Ht 68.5 in | Wt 231.8 lb

## 2018-03-26 DIAGNOSIS — A419 Sepsis, unspecified organism: Principal | ICD-10-CM | POA: Diagnosis present

## 2018-03-26 DIAGNOSIS — E871 Hypo-osmolality and hyponatremia: Secondary | ICD-10-CM | POA: Diagnosis present

## 2018-03-26 DIAGNOSIS — Z79899 Other long term (current) drug therapy: Secondary | ICD-10-CM

## 2018-03-26 DIAGNOSIS — R05 Cough: Secondary | ICD-10-CM | POA: Diagnosis not present

## 2018-03-26 DIAGNOSIS — I959 Hypotension, unspecified: Secondary | ICD-10-CM | POA: Diagnosis present

## 2018-03-26 DIAGNOSIS — Z833 Family history of diabetes mellitus: Secondary | ICD-10-CM

## 2018-03-26 DIAGNOSIS — R6520 Severe sepsis without septic shock: Secondary | ICD-10-CM | POA: Diagnosis not present

## 2018-03-26 DIAGNOSIS — I700 Atherosclerosis of aorta: Secondary | ICD-10-CM | POA: Diagnosis present

## 2018-03-26 DIAGNOSIS — E860 Dehydration: Secondary | ICD-10-CM | POA: Diagnosis present

## 2018-03-26 DIAGNOSIS — J9601 Acute respiratory failure with hypoxia: Secondary | ICD-10-CM | POA: Diagnosis present

## 2018-03-26 DIAGNOSIS — Z888 Allergy status to other drugs, medicaments and biological substances status: Secondary | ICD-10-CM

## 2018-03-26 DIAGNOSIS — I10 Essential (primary) hypertension: Secondary | ICD-10-CM | POA: Diagnosis present

## 2018-03-26 DIAGNOSIS — Z86010 Personal history of colonic polyps: Secondary | ICD-10-CM

## 2018-03-26 DIAGNOSIS — E119 Type 2 diabetes mellitus without complications: Secondary | ICD-10-CM

## 2018-03-26 DIAGNOSIS — I110 Hypertensive heart disease with heart failure: Secondary | ICD-10-CM | POA: Diagnosis present

## 2018-03-26 DIAGNOSIS — I5032 Chronic diastolic (congestive) heart failure: Secondary | ICD-10-CM | POA: Diagnosis not present

## 2018-03-26 DIAGNOSIS — E872 Acidosis: Secondary | ICD-10-CM | POA: Diagnosis present

## 2018-03-26 DIAGNOSIS — J189 Pneumonia, unspecified organism: Secondary | ICD-10-CM | POA: Diagnosis not present

## 2018-03-26 DIAGNOSIS — J181 Lobar pneumonia, unspecified organism: Secondary | ICD-10-CM | POA: Diagnosis not present

## 2018-03-26 DIAGNOSIS — I2510 Atherosclerotic heart disease of native coronary artery without angina pectoris: Secondary | ICD-10-CM | POA: Diagnosis present

## 2018-03-26 DIAGNOSIS — I4510 Unspecified right bundle-branch block: Secondary | ICD-10-CM | POA: Diagnosis not present

## 2018-03-26 DIAGNOSIS — Z8249 Family history of ischemic heart disease and other diseases of the circulatory system: Secondary | ICD-10-CM

## 2018-03-26 DIAGNOSIS — E782 Mixed hyperlipidemia: Secondary | ICD-10-CM | POA: Diagnosis present

## 2018-03-26 DIAGNOSIS — R0902 Hypoxemia: Secondary | ICD-10-CM | POA: Diagnosis not present

## 2018-03-26 DIAGNOSIS — K7460 Unspecified cirrhosis of liver: Secondary | ICD-10-CM | POA: Diagnosis present

## 2018-03-26 DIAGNOSIS — J180 Bronchopneumonia, unspecified organism: Secondary | ICD-10-CM | POA: Diagnosis not present

## 2018-03-26 DIAGNOSIS — F17200 Nicotine dependence, unspecified, uncomplicated: Secondary | ICD-10-CM | POA: Diagnosis present

## 2018-03-26 DIAGNOSIS — R0602 Shortness of breath: Secondary | ICD-10-CM | POA: Diagnosis not present

## 2018-03-26 DIAGNOSIS — R6889 Other general symptoms and signs: Secondary | ICD-10-CM

## 2018-03-26 DIAGNOSIS — K8020 Calculus of gallbladder without cholecystitis without obstruction: Secondary | ICD-10-CM | POA: Diagnosis not present

## 2018-03-26 DIAGNOSIS — K7581 Nonalcoholic steatohepatitis (NASH): Secondary | ICD-10-CM | POA: Diagnosis present

## 2018-03-26 DIAGNOSIS — K8010 Calculus of gallbladder with chronic cholecystitis without obstruction: Secondary | ICD-10-CM | POA: Diagnosis present

## 2018-03-26 DIAGNOSIS — D6959 Other secondary thrombocytopenia: Secondary | ICD-10-CM | POA: Diagnosis present

## 2018-03-26 DIAGNOSIS — K7689 Other specified diseases of liver: Secondary | ICD-10-CM | POA: Diagnosis not present

## 2018-03-26 DIAGNOSIS — N179 Acute kidney failure, unspecified: Secondary | ICD-10-CM | POA: Diagnosis present

## 2018-03-26 DIAGNOSIS — I5033 Acute on chronic diastolic (congestive) heart failure: Secondary | ICD-10-CM | POA: Diagnosis present

## 2018-03-26 LAB — URINALYSIS, ROUTINE W REFLEX MICROSCOPIC
Bilirubin Urine: NEGATIVE
Glucose, UA: NEGATIVE mg/dL
Hgb urine dipstick: NEGATIVE
Ketones, ur: NEGATIVE mg/dL
LEUKOCYTE UA: NEGATIVE
NITRITE: NEGATIVE
Protein, ur: NEGATIVE mg/dL
Specific Gravity, Urine: 1.014 (ref 1.005–1.030)
pH: 5 (ref 5.0–8.0)

## 2018-03-26 LAB — CBC WITH DIFFERENTIAL/PLATELET
Abs Immature Granulocytes: 0.08 10*3/uL — ABNORMAL HIGH (ref 0.00–0.07)
Basophils Absolute: 0.1 10*3/uL (ref 0.0–0.1)
Basophils Relative: 1 %
Eosinophils Absolute: 0.2 10*3/uL (ref 0.0–0.5)
Eosinophils Relative: 2 %
HCT: 37.3 % — ABNORMAL LOW (ref 39.0–52.0)
Hemoglobin: 12.1 g/dL — ABNORMAL LOW (ref 13.0–17.0)
Immature Granulocytes: 1 %
Lymphocytes Relative: 4 %
Lymphs Abs: 0.5 10*3/uL — ABNORMAL LOW (ref 0.7–4.0)
MCH: 31.6 pg (ref 26.0–34.0)
MCHC: 32.4 g/dL (ref 30.0–36.0)
MCV: 97.4 fL (ref 80.0–100.0)
Monocytes Absolute: 2 10*3/uL — ABNORMAL HIGH (ref 0.1–1.0)
Monocytes Relative: 16 %
Neutro Abs: 9.5 10*3/uL — ABNORMAL HIGH (ref 1.7–7.7)
Neutrophils Relative %: 76 %
Platelets: 70 10*3/uL — ABNORMAL LOW (ref 150–400)
RBC: 3.83 MIL/uL — ABNORMAL LOW (ref 4.22–5.81)
RDW: 18.9 % — ABNORMAL HIGH (ref 11.5–15.5)
WBC: 12.3 10*3/uL — ABNORMAL HIGH (ref 4.0–10.5)
nRBC: 0 % (ref 0.0–0.2)

## 2018-03-26 LAB — INFLUENZA PANEL BY PCR (TYPE A & B)
Influenza A By PCR: NEGATIVE
Influenza B By PCR: NEGATIVE

## 2018-03-26 LAB — BASIC METABOLIC PANEL
Anion gap: 10 (ref 5–15)
BUN: 16 mg/dL (ref 8–23)
CO2: 23 mmol/L (ref 22–32)
Calcium: 8 mg/dL — ABNORMAL LOW (ref 8.9–10.3)
Chloride: 98 mmol/L (ref 98–111)
Creatinine, Ser: 1.36 mg/dL — ABNORMAL HIGH (ref 0.61–1.24)
GFR calc Af Amer: >60 mL/min (ref >60)
GFR calc non Af Amer: 53 mL/min — ABNORMAL LOW (ref >60)
Glucose, Bld: 137 mg/dL — ABNORMAL HIGH (ref 70–99)
Potassium: 3.6 mmol/L (ref 3.5–5.1)
Sodium: 131 mmol/L — ABNORMAL LOW (ref 135–145)

## 2018-03-26 LAB — LACTIC ACID, PLASMA
Lactic Acid, Venous: 2.7 mmol/L (ref 0.5–1.9)
Lactic Acid, Venous: 2.9 mmol/L (ref 0.5–1.9)

## 2018-03-26 LAB — POC INFLUENZA A&B (BINAX/QUICKVUE)
Influenza A, POC: NEGATIVE
Influenza B, POC: NEGATIVE

## 2018-03-26 LAB — STREP PNEUMONIAE URINARY ANTIGEN: Strep Pneumo Urinary Antigen: NEGATIVE

## 2018-03-26 LAB — EXPECTORATED SPUTUM ASSESSMENT W GRAM STAIN, RFLX TO RESP C

## 2018-03-26 LAB — I-STAT TROPONIN, ED: Troponin i, poc: 0.01 ng/mL (ref 0.00–0.08)

## 2018-03-26 LAB — EXPECTORATED SPUTUM ASSESSMENT W REFEX TO RESP CULTURE

## 2018-03-26 MED ORDER — TRANEXAMIC ACID 650 MG PO TABS
1300.0000 mg | ORAL_TABLET | Freq: Three times a day (TID) | ORAL | Status: DC
  Administered 2018-03-26 – 2018-03-31 (×14): 1300 mg via ORAL
  Filled 2018-03-26 (×15): qty 2

## 2018-03-26 MED ORDER — ALBUTEROL SULFATE (2.5 MG/3ML) 0.083% IN NEBU: 2.5000 mg | INHALATION_SOLUTION | RESPIRATORY_TRACT | Status: DC | PRN

## 2018-03-26 MED ORDER — SODIUM CHLORIDE 0.9 % IV SOLN
2.0000 g | INTRAVENOUS | Status: DC
  Administered 2018-03-26: 2 g via INTRAVENOUS
  Filled 2018-03-26: qty 20

## 2018-03-26 MED ORDER — IOPAMIDOL (ISOVUE-370) INJECTION 76%
100.0000 mL | Freq: Once | INTRAVENOUS | Status: AC | PRN
  Administered 2018-03-26: 100 mL via INTRAVENOUS

## 2018-03-26 MED ORDER — PANTOPRAZOLE SODIUM 40 MG PO TBEC
40.0000 mg | DELAYED_RELEASE_TABLET | Freq: Two times a day (BID) | ORAL | Status: DC
  Administered 2018-03-26 – 2018-03-31 (×10): 40 mg via ORAL
  Filled 2018-03-26 (×10): qty 1

## 2018-03-26 MED ORDER — LACTULOSE 10 GM/15ML PO SOLN: 30.0000 g | Freq: Two times a day (BID) | ORAL | Status: DC | PRN

## 2018-03-26 MED ORDER — SODIUM CHLORIDE 0.9 % IV SOLN
INTRAVENOUS | Status: DC
  Administered 2018-03-26: 22:00:00 via INTRAVENOUS

## 2018-03-26 MED ORDER — SODIUM CHLORIDE 0.9 % IV BOLUS (SEPSIS)
1000.0000 mL | Freq: Once | INTRAVENOUS | Status: AC
  Administered 2018-03-26: 1000 mL via INTRAVENOUS

## 2018-03-26 MED ORDER — SODIUM CHLORIDE 0.9 % IV BOLUS (SEPSIS): 1000.0000 mL | Freq: Once | INTRAVENOUS | Status: DC

## 2018-03-26 MED ORDER — IOPAMIDOL (ISOVUE-370) INJECTION 76%
INTRAVENOUS | Status: AC
  Filled 2018-03-26: qty 100

## 2018-03-26 MED ORDER — SODIUM CHLORIDE 0.9 % IV BOLUS (SEPSIS): 500.0000 mL | Freq: Once | INTRAVENOUS | Status: DC

## 2018-03-26 MED ORDER — SODIUM CHLORIDE 0.9 % IV BOLUS
1000.0000 mL | Freq: Once | INTRAVENOUS | Status: AC
  Administered 2018-03-26: 1000 mL via INTRAVENOUS

## 2018-03-26 MED ORDER — SODIUM CHLORIDE 0.9 % IV SOLN
500.0000 mg | INTRAVENOUS | Status: DC
  Administered 2018-03-27 – 2018-03-31 (×5): 500 mg via INTRAVENOUS
  Filled 2018-03-26 (×6): qty 500

## 2018-03-26 MED ORDER — SODIUM CHLORIDE 0.9 % IV SOLN
2.0000 g | INTRAVENOUS | Status: DC
  Administered 2018-03-27 – 2018-03-31 (×5): 2 g via INTRAVENOUS
  Filled 2018-03-26 (×5): qty 2

## 2018-03-26 MED ORDER — GUAIFENESIN ER 600 MG PO TB12
1200.0000 mg | ORAL_TABLET | Freq: Two times a day (BID) | ORAL | Status: DC
  Administered 2018-03-26 – 2018-03-31 (×8): 1200 mg via ORAL
  Filled 2018-03-26 (×10): qty 2

## 2018-03-26 MED ORDER — FLUTICASONE PROPIONATE 50 MCG/ACT NA SUSP
2.0000 | Freq: Every day | NASAL | Status: DC
  Administered 2018-03-26 – 2018-03-31 (×6): 2 via NASAL
  Filled 2018-03-26: qty 16

## 2018-03-26 MED ORDER — GUAIFENESIN-DM 100-10 MG/5ML PO SYRP
5.0000 mL | ORAL_SOLUTION | ORAL | Status: DC | PRN
  Administered 2018-03-27 – 2018-03-31 (×3): 5 mL via ORAL
  Filled 2018-03-26 (×3): qty 10

## 2018-03-26 MED ORDER — SODIUM CHLORIDE 0.9 % IV SOLN
500.0000 mg | INTRAVENOUS | Status: DC
  Administered 2018-03-26: 500 mg via INTRAVENOUS
  Filled 2018-03-26: qty 500

## 2018-03-26 MED ORDER — BENZONATATE 100 MG PO CAPS
100.0000 mg | ORAL_CAPSULE | Freq: Three times a day (TID) | ORAL | Status: DC
  Administered 2018-03-26 – 2018-03-31 (×14): 100 mg via ORAL
  Filled 2018-03-26 (×14): qty 1

## 2018-03-26 MED ORDER — SODIUM CHLORIDE (PF) 0.9 % IJ SOLN
INTRAMUSCULAR | Status: AC
  Filled 2018-03-26: qty 50

## 2018-03-26 NOTE — ED Notes (Signed)
Date and time results received: 03/26/18 1620 (use smartphrase ".now" to insert current time)  Test: lactic acid  Critical Value: 2.9  Name of Provider Notified: Rodell Perna  Orders Received? Or Actions Taken?: Actions Taken: notified [facility] of lactic acid 2.9.

## 2018-03-26 NOTE — ED Provider Notes (Signed)
[facility] DEPT Provider Note   CSN: [id number] Arrival date & time: 03/26/18  1258     History   Chief Complaint Chief Complaint  Patient presents with  . Shortness of Breath  . Fever    HPI William [name] is a 68 y.o. male with extensive medical history as detailed below presents sent from PCP for evaluation of acute onset, progressively worsening shortness of breath and cough for 4 days.  Reports cough is productive of initially clear but now yellowish-green sputum.  He had low-grade fevers but had a documented temperature at home of 101.2 F last night.  Went to his PCP today where he was found to be hypoxic with improvement on 3 L via nasal cannula.  He is not home O2 dependent typically.  He does report dyspnea on exertion.  Denies chest pain, abdominal pain, leg swelling, orthopnea, nausea, vomiting.  He has mild nasal congestion, denies sore throat.  Rapid flu test at the PCPs office was negative.  He denies any recent travel or surgeries, no hemoptysis, no prior history of DVT or PE.  He is not on hormone replacement therapy.  The history is provided by the patient.    Past Medical History:  Diagnosis Date  . AAA (abdominal aortic aneurysm) ([place]) 03/2014   3.2 cm on MR abd.  F/u aortic u/s 06/2014 showed 3.0 x 3.1 cm AAA: recheck 2 yrs recommended (followed by cardiologist).  Minimal growth on 10/2016 CT abd done for epig pain.  Repeat u/s 10/2018.  [name] Anemia due to chronic blood loss 2018/19   GI: transfusions x >20 required; multiple endoscopies and bleeding scans unrevealing.  Hemolysis ruled out by hematologist.  Marshell Levan and octreotide + monthly iron infusions as of 04/2017.  . Bilateral renal cysts    simple (03/2014 MRI)  . CAD (coronary artery disease)   . Cholelithiases 2018   asymptomatic  . Chronic diastolic heart failure ([place])   . Cirrhosis ([place]) 05/2016   secondary to NASH; most recent u/s abd 07/2016 showed splenomegaly.  Hx of portal  HTN changes with mild ascites and splenomegaly.  On lactulose as of 2019.  . Diabetes mellitus with complication ([place]) 31/5176   A1c 6.8%  . History of blood transfusion 2018 X 4 dates   "low blood" (09/21/2016)  . Hyperlipemia, mixed    elevated LFTs when on statins.    . Hypertension    Cr bump 04/01/16 so I changed benicar-hct to benicar plain and added amlodipine 5 mg.  . Iron deficiency anemia 05/2016   Acute blood loss anemia: hospitalized, required transfusion x 3 U: colonoscopy and capsule study unrevealing.  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  W/u for ? hemolytic anemia to be pursued by hematologist as outpt.  Dr. [name], GI at [facility] following, too---he rec'd onc do bone marrow bx as of Jan 2019  . Lung field abnormal finding on examination    Bibasilar L>R mild insp crackles-->x-ray showed mild interstitial changes/fibrosis/scarring.  Changes noted on all CXRs in 2018/2019.  . Microscopic hematuria    Eval unremarkable by Dr. Eulogio Ditch.  [name] NASH (nonalcoholic steatohepatitis)    Fatty liver on MR abd 03/2014, + hx of elevated transaminases and bili: followed by Dr. [name].  . Obesity   . Spleen enlarged     Patient Active Problem List   Diagnosis Date Noted  . SOB (shortness of  breath) 03/26/2018  . Sepsis ([place]) 03/26/2018  . Acute respiratory failure with hypoxia ([place]) 03/26/2018  . CAP (community acquired pneumonia) 03/26/2018  . Anemia 10/14/2016  . Chronic GI bleeding   . Acute blood loss anemia 08/31/2016  . Iron deficiency anemia due to chronic blood loss 08/31/2016  . Acute GI bleeding 08/31/2016  . Cirrhosis, nonalcoholic ([place]) 69/67/8938  . Portal hypertension ([place]) 08/31/2016  . Splenomegaly 08/31/2016  . Esophageal varices ([place]) 08/31/2016  . Symptomatic anemia 07/30/2016  . Thrombocytopenia ([place]) 07/29/2016  . Weakness 07/28/2016  . Aortic stenosis, mild 07/28/2016  .  Chronic diastolic CHF (congestive heart failure) ([place])   . History of GI bleed 05/22/2016  . Pulmonary edema 05/22/2016  . Acute on chronic renal failure ([place]) 05/22/2016  . Non-insulin treated type 2 diabetes mellitus ([place]) 05/22/2016  . Moderate obesity 11/15/2013  . Abdominal aortic aneurysm ([place]) 03/19/2013  . Hematuria 03/19/2013  . Morbid obesity ([place]) 08/24/2012  . 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 Metabolic syndrome 11/23/5100  . Essential hypertension 08/24/2012  . Mixed hyperlipidemia 08/24/2012    Past Surgical History:  Procedure Laterality Date  . CARDIAC CATHETERIZATION    . CARDIOVASCULAR STRESS TEST  01/17/2012; 05/04/16   Normal stress nuclear study x 2 (2018--Normal perfusion. LVEF 66% with normal wall motion. This is a low risk study).  . CERVICAL DISCECTOMY  1992  . COLONOSCOPY N/A 05/27/2016   No site/explanation for blood loss found.  Erythematous mucosa in cecum and ascending colon--Cecal bx: normal.  Procedure: COLONOSCOPY;  Surgeon: Carol Ada, MD;  Location: [facility] ENDOSCOPY;  Service: Endoscopy;  Laterality: N/A;  . COLONOSCOPY W/ POLYPECTOMY  09/10/2014   Polypectomy x 2: recall 5 yrs (Dr. [name]).  . CORONARY ANGIOPLASTY    . CORONARY ARTERY BYPASS GRAFT  03/2006  . ENTEROSCOPY N/A 07/31/2016   Procedure: ENTEROSCOPY;  Surgeon: Ladene Artist, MD;  Location: [facility] ENDOSCOPY;  Service: Endoscopy;  Laterality: N/A;  . ENTEROSCOPY N/A 12/02/2016   Procedure: ENTEROSCOPY;  Surgeon: Carol Ada, MD;  Location: [facility] ENDOSCOPY;  Service: Endoscopy;  Laterality: N/A;  . ESOPHAGOGASTRODUODENOSCOPY N/A 05/25/2016   No site/explanation for blood loss found.  Procedure: ESOPHAGOGASTRODUODENOSCOPY (EGD);  Surgeon: Juanita Craver, MD;  Location: [facility] ENDOSCOPY;  Service: Endoscopy;  Laterality: N/A;  . ESOPHAGOGASTRODUODENOSCOPY N/A 09/23/2016   Procedure: ESOPHAGOGASTRODUODENOSCOPY (EGD);  Surgeon: Carol Ada, MD;  Location: [facility];  Service: Endoscopy;  Laterality: N/A;  . GIVENS  CAPSULE STUDY N/A 05/27/2016   No source identified.  Repeat 10/2016--results pending.  Procedure: GIVENS CAPSULE STUDY;  Surgeon: Carol Ada, MD;  Location: [facility] ENDOSCOPY;  Service: Endoscopy;  Laterality: N/A;  . GIVENS CAPSULE STUDY N/A 10/15/2016   Procedure: GIVENS CAPSULE STUDY;  Surgeon: Carol Ada, MD;  Location: [facility] ENDOSCOPY;  Service: Endoscopy;  Laterality: N/A;  . GIVENS CAPSULE STUDY N/A 02/13/2017   Procedure: GIVENS CAPSULE STUDY;  Surgeon: Carol Ada, MD;  Location: [facility];  Service: Endoscopy;  Laterality: N/A;  . NECK SURGERY  1991  . NM GI BLOOD LOSS  01/27/2017   NEGATIVE  . SMALL BOWEL ENTEROSCOPY  10/2016   ([facility], Dr. [name]: Double balloon enteroscopy--to the level of proximal ileum) jejunal polyps- which were removed--not bleeding & benign.  No source of bleeding was identified.  . TRANSTHORACIC ECHOCARDIOGRAM  05/25/2016; 10/2017   05/2016: EF 60%, normal wall motion, grd II DD, mild aortic stenosis, dilated aortic root and ascending aorta. 10/2017: Mild LVH; no mention made of LV function.  Moderate aorticstenosis with mean  gradient 19 peak gradient 41. AVA 1.1 cm^2.  Mild ascending aorta dilation.  Mean gradient slightly lower than in 2018.  [name] VASECTOMY  1977        Home Medications    Prior to Admission medications   Medication Sig Start Date End Date Taking? Authorizing Provider  fluticasone (FLONASE) 50 MCG/ACT nasal spray Place 2 sprays into both nostrils daily. Patient taking differently: Place 2 sprays into both nostrils daily as needed for allergies.  01/01/16  Yes Withrow, Elyse Jarvis, FNP  metoprolol tartrate (LOPRESSOR) 25 MG tablet TAKE 1/2 TABLET BY MOUTH TWICE A DAY Patient taking differently: Take 12.5 mg by mouth 2 (two) times daily.  01/23/18  Yes McGowen, Adrian Blackwater, MD  ondansetron (ZOFRAN) 4 MG tablet Take 1 tablet (4 mg total) by mouth every 8 (eight) hours as needed for nausea or vomiting. 04/05/17  Yes McGowen, Adrian Blackwater, MD  pantoprazole  (PROTONIX) 40 MG tablet TAKE 1 TABLET BY MOUTH TWICE DAILY BEFORE A MEAL Patient taking differently: Take 40 mg by mouth 2 (two) times daily.  11/16/17  Yes McGowen, Adrian Blackwater, MD  potassium chloride SA (K-DUR,KLOR-CON) 20 MEQ tablet TAKE 1 TABLET (20 MEQ TOTAL) BY MOUTH TWICE A DAY Patient taking differently: Take 20 mEq by mouth daily.  01/10/18  Yes McGowen, Adrian Blackwater, MD  spironolactone (ALDACTONE) 25 MG tablet Take 12.5 mg by mouth daily.   Yes [provider]  torsemide (DEMADEX) 20 MG tablet TAKE 2 TABLETS BY MOUTH DAILY. Patient taking differently: Take 40 mg by mouth daily.  03/23/18  Yes McGowen, Adrian Blackwater, MD  tranexamic acid (LYSTEDA) 650 MG TABS tablet TAKE 2 TABLETS BY MOUTH 3 TIMES DAILY. Patient taking differently: Take 1,300 mg by mouth 3 (three) times daily.  02/23/18  Yes Brunetta Genera, MD  lactulose (CHRONULAC) 10 GM/15ML solution Take 45 mLs (30 g total) by mouth 3 (three) times daily. Take this until you have three BM's a day. Patient taking differently: Take 30 g by mouth 2 (two) times daily as needed for mild constipation. Take this until you have three BM's a day. 05/11/17   Deno Etienne, DO    Family History Family History  Problem Relation Age of Onset  . Hypertension Mother   . Cancer - Other Mother        liver cancer  . Heart disease Father   . Heart attack Father   . Cancer - Lung Father   . Diabetes Father   . Liver disease Sister   . Anemia Sister   . Heart disease Sister   . Heart attack Sister   . Heart disease Brother        CABG 20 YEARS AGO  . Hypertension Brother   . Mesothelioma Brother        HALF-BROTHER  . Cancer - Other Brother        CLL  . Cancer - Lung Brother        Mets to brain  . Cancer - Lung Sister        HALF-SISTER  . Liver disease Brother   . Heart disease Brother        CABG-2012  . Emphysema Maternal Grandfather   . Cancer - Other Paternal Grandmother        Stomach  . Heart attack Paternal Grandfather      Social History Social History   Tobacco Use  . Smoking status: Former Smoker    Types: Cigarettes  . Smokeless  tobacco: Never Used  . Tobacco comment: QUIT I 1983  Substance Use Topics  . Alcohol use: No  . Drug use: No     Allergies   Statins   Review of Systems Review of Systems  Constitutional: Positive for chills and fever.  HENT: Positive for congestion.   Respiratory: Positive for cough and shortness of breath.   Cardiovascular: Negative for chest pain and leg swelling.  Gastrointestinal: Negative for abdominal pain, nausea and vomiting.  All other systems reviewed and are negative.    Physical Exam Updated Vital Signs BP (!) 117/59 (BP Location: Right Arm)   Pulse 81   Temp 98.3 F (36.8 C) (Oral)   Resp 20   Ht 5' 9"  (1.753 m)   Wt 108.1 kg   SpO2 99%   BMI 35.19 kg/m   Physical Exam Vitals signs and nursing note reviewed.  Constitutional:      General: He is not in acute distress.    Appearance: He is well-developed.  HENT:     Head: Normocephalic and atraumatic.  Eyes:     General: Scleral icterus present.        Right eye: No discharge.        Left eye: No discharge.     Conjunctiva/sclera: Conjunctivae normal.  Neck:     Musculoskeletal: Normal range of motion and neck supple.     Vascular: No JVD.     Trachea: No tracheal deviation.  Cardiovascular:     Rate and Rhythm: Normal rate.     Heart sounds: Murmur present.     Comments: systolic crescendo-decrescendo murmur noted. 2+ radial and DP/PT pulses bilaterally, Homans sign absent bilaterally, no lower extremity edema, no palpable cords, compartments are soft  Pulmonary:     Effort: Tachypnea present.     Comments: Mildly tachypneic.  Bibasilar crackles, right greater than left.  SPO2 saturation is 96% on 3 L via nasal cannula Chest:     Chest wall: No tenderness.  Abdominal:     General: Abdomen is protuberant. Bowel sounds are normal. There is no distension.     Palpations:  Abdomen is soft.     Tenderness: There is no abdominal tenderness.  Musculoskeletal:     Right lower leg: He exhibits no tenderness. No edema.     Left lower leg: He exhibits no tenderness.  Skin:    General: Skin is warm and dry.     Findings: No erythema.  Neurological:     Mental Status: He is alert.  Psychiatric:        Behavior: Behavior normal.      ED Treatments / Results  Labs (all labs ordered are listed, but only abnormal results are displayed) Labs Reviewed  BASIC METABOLIC PANEL - Abnormal; Notable for the following components:      Result Value   Sodium 131 (*)    Glucose, Bld 137 (*)    Creatinine, Ser 1.36 (*)    Calcium 8.0 (*)    GFR calc non Af Amer 53 (*)    All other components within normal limits  CBC WITH DIFFERENTIAL/PLATELET - Abnormal; Notable for the following components:   WBC 12.3 (*)    RBC 3.83 (*)    Hemoglobin 12.1 (*)    HCT 37.3 (*)    RDW 18.9 (*)    Platelets 70 (*)    Neutro Abs 9.5 (*)    Lymphs Abs 0.5 (*)    Monocytes Absolute 2.0 (*)  Abs Immature Granulocytes 0.08 (*)    All other components within normal limits  LACTIC ACID, PLASMA - Abnormal; Notable for the following components:   Lactic Acid, Venous 2.7 (*)    All other components within normal limits  LACTIC ACID, PLASMA - Abnormal; Notable for the following components:   Lactic Acid, Venous 2.9 (*)    All other components within normal limits  BASIC METABOLIC PANEL - Abnormal; Notable for the following components:   Sodium 133 (*)    Potassium 3.4 (*)    CO2 20 (*)    Glucose, Bld 116 (*)    Calcium 7.4 (*)    All other components within normal limits  CBC - Abnormal; Notable for the following components:   RBC 3.34 (*)    Hemoglobin 10.8 (*)    HCT 34.2 (*)    MCV 102.4 (*)    RDW 18.8 (*)    Platelets 58 (*)    All other components within normal limits  BRAIN NATRIURETIC PEPTIDE - Abnormal; Notable for the following components:   B Natriuretic Peptide  276.6 (*)    All other components within normal limits  CULTURE, BLOOD (ROUTINE X 2)  CULTURE, BLOOD (ROUTINE X 2)  EXPECTORATED SPUTUM ASSESSMENT W REFEX TO RESP CULTURE  CULTURE, RESPIRATORY  INFLUENZA PANEL BY PCR (TYPE A & B)  URINALYSIS, ROUTINE W REFLEX MICROSCOPIC  STREP PNEUMONIAE URINARY ANTIGEN  HIV ANTIBODY (ROUTINE TESTING W REFLEX)  I-STAT TROPONIN, ED    EKG EKG Interpretation  Date/Time:  Monday March 26 2018 16:37:40 EST Ventricular Rate:  85 PR Interval:    QRS Duration: 113 QT Interval:  406 QTC Calculation: 483 R Axis:   -43 Text Interpretation:  Sinus rhythm Incomplete right bundle branch block Borderline prolonged QT interval Artifact Abnormal ekg Confirmed by Carmin Muskrat [phone]) on 03/27/2018 10:36:39 AM   Radiology Dg Chest 2 View  Result Date: 03/26/2018 CLINICAL DATA:  Patient is from home and transferred from PCP office today via [facility]. According to EMS, patient is experiencing shortness of breath, fever, and productive cough since Friday. When at PCP office, patients oxygen level was 85% on room airFormer smoker - diabetic - hx heart surgery EXAM: CHEST - 2 VIEW COMPARISON:  07/28/2017 FINDINGS: There are stable changes from prior CABG surgery. The cardiac silhouette is normal in size and configuration. No mediastinal or hilar masses. No convincing adenopathy. There are prominent bronchovascular and interstitial markings that are stable from the prior exam. No evidence of pneumonia or pulmonary edema. No pleural effusion or pneumothorax. Skeletal structures are intact. IMPRESSION: 1. No acute cardiopulmonary disease. 2. Chronically prominent bronchovascular markings and interstitial thickening stable from the prior exam. Electronically Signed   By: Lajean Manes M.D.   On: 03/26/2018 15:13   Ct Angio Chest Pe W And/or Wo Contrast  Result Date: 03/26/2018 CLINICAL DATA:  Shortness of breath with fever and cough EXAM: CT ANGIOGRAPHY CHEST  WITH CONTRAST TECHNIQUE: Multidetector CT imaging of the chest was performed using the standard protocol during bolus administration of intravenous contrast. Multiplanar CT image reconstructions and MIPs were obtained to evaluate the vascular anatomy. CONTRAST:  155m ISOVUE-370 IOPAMIDOL (ISOVUE-370) INJECTION 76% COMPARISON:  Chest radiograph March 26, 2018 FINDINGS: Cardiovascular: Contrast bolus is somewhat less than optimal. There is also a slight degree of motion artifact. Given these limitations, there is no pulmonary embolus appreciable. The ascending thoracic aorta measures 4.2 x 4.2 cm in diameter. No thoracic aortic dissection is evident.  There are scattered foci of calcification in visualized great vessels. There is aortic atherosclerosis. Patient is status post coronary artery bypass grafting. There is native coronary artery calcification. There is no pericardial effusion or pericardial thickening evident. Mediastinum/Nodes: Visualized thyroid appears unremarkable. There are subcentimeter mediastinal lymph nodes. There is no frank adenopathy by size criteria. No esophageal lesions are evident. Lungs/Pleura: There is diffuse interstitial thickening with likely degree of interstitial pulmonary edema. There is airspace opacity in the posterior segment of the left lower lobe, concerning for focal pneumonia. There is atelectatic change in both lower lobes as well. There is an area of apparent consolidation in the anterior segment of the left upper lobe measuring 2.5 x 2.2 cm, best seen on axial slice 59 series 10. On axial slice 33 series 10, there is an 8 x 8 mm nodular opacity in the anterior segment of the right upper lobe. There is a nodular opacity in the apical segment of the right upper lobe measuring 6 x 6 mm. There is an ill-defined opacity in the superior segment of the right lower lobe on axial slice 59 series 10 measuring 7 x 6 mm. There is no appreciable pleural effusion. Upper Abdomen: There  is cholelithiasis. Liver contour is nodular consistent with hepatic cirrhosis. Spleen is incompletely visualized but appears enlarged. There is upper abdominal aortic atherosclerosis. Musculoskeletal: Patient is status post median sternotomy. There is extensive degenerative change in the thoracic spine with diffuse idiopathic skeletal hyperostosis. There are no blastic or lytic bone lesions. There are no chest wall lesions evident. Review of the MIP images confirms the above findings. IMPRESSION: 1. No pulmonary embolus evident. Note that contrast bolus is somewhat less than optimal, and there is a degree of patient motion, making assessment of the more peripheral pulmonary arteries less than optimal. 2. Ascending thoracic aorta measures 4.2 x 4.2 cm in transverse diameter. Recommend annual imaging followup by CTA or MRA. This recommendation follows 2010 ACCF/AHA/AATS/ACR/ASA/SCA/SCAI/SIR/STS/SVM Guidelines for the Diagnosis and Management of Patients with Thoracic Aortic Disease. Circulation. 2010; 121: Z601-U932. Aortic aneurysm NOS (ICD10-I71.9). No thoracic aortic dissection evident. There is aortic atherosclerosis. There are foci of calcification in visualized great vessels. There are foci of native coronary artery calcification. Patient is status post coronary artery bypass grafting. 3. Interstitial edema raising concern for a degree of underlying congestive heart failure. Atelectasis in both lower lobes noted. Consolidation in the posterior left base is concerning for pneumonia. There may be alveolar edema in this area as well. 4. There is an opacity in the anterior segment of the left upper lobe peripherally measuring 2.5 x 2.2 cm. This area may represent a second focus of pneumonia. A neoplastic focus with adjacent atelectasis could present in this manner. Given this finding, a follow-up chest CT after treatment for pneumonia in 3-4 weeks is felt to be warranted to further evaluate this area. In addition,  there are several subcentimeter nodular opacities on the right which warrant additional surveillance. Assuming that the larger lesion on the left clears after treatment for pneumonia, noncontrast chest CT at 3-6 months is recommended. If the nodules are stable at time of repeat CT, then future CT at 18-24 months (from today's scan) is considered optional for low-risk patients, but is recommended for high-risk patients. This recommendation follows the consensus statement: Guidelines for Management of Incidental Pulmonary Nodules Detected on CT Images: From the Fleischner Society 2017; Radiology 2017; 284:228-243. 5.  No appreciable adenopathy. 6. Evidence of hepatic cirrhosis. Incomplete visualization of enlarged spleen.  7.  Cholelithiasis. 8.  Diffuse idiopathic skeletal hyperostosis in the thoracic spine. Aortic Atherosclerosis (ICD10-I70.0). Electronically Signed   By: Lowella Grip III M.D.   On: 03/26/2018 18:28   US Abdomen Complete  Result Date: 03/27/2018 CLINICAL DATA:  Cirrhosis. EXAM: ABDOMEN ULTRASOUND COMPLETE COMPARISON:  GI bleeding study 01/27/2017.  CT 11/01/2016. FINDINGS: Gallbladder: Multiple gallstones. Gallbladder wall thickness 3.2 mm. This is slightly prominent. Cholecystitis could not be excluded. Negative Murphy sign. Common bile duct: Diameter: 1.4 mm Liver: Coarse nodular parenchymal pattern noted. This could be secondary to cirrhosis. A 1.5 cm simple cyst is noted in the right lobe of the liver. Portal vein is patent on color Doppler imaging with normal direction of blood flow towards the liver. IVC: No abnormality visualized. Pancreas: Visualized portion unremarkable. Spleen: Spleen is prominent at 14.5 cm with a volume of 1220.3 cc Right Kidney: Length: 12.4 cm. Echogenicity within normal limits. 4.0 cm simple cyst no hydronephrosis visualized. Left Kidney: Length: 11.8 cm. Echogenicity within normal limits. No mass or hydronephrosis visualized. Abdominal aorta: No aneurysm  visualized. Other findings: Mild ascites. IMPRESSION: 1. Multiple gallstones. Gallbladder wall slightly prominent at 3.2 mm. Cholecystitis can not be excluded. Negative Murphy sign. 2. Coarse nodular hepatic parenchymal pattern. This could be secondary to cirrhosis. Portal vein is patent with normal direction of flow. 1.5 cm simple cyst noted the right lobe of the liver. Splenomegaly. Mild ascites. 3.  4.0 cm simple cyst right kidney. Electronically Signed   By: Marcello Moores  Register   On: 03/27/2018 10:48    Procedures .Critical Care Performed by: Renita Papa, PA-C Authorized by: Renita Papa, PA-C   Critical care provider statement:    Critical care time (minutes):  40   Critical care was necessary to treat or prevent imminent or life-threatening deterioration of the following conditions:  Respiratory failure and sepsis   Critical care was time spent personally by me on the following activities:  Discussions with consultants, evaluation of patient's response to treatment, examination of patient, ordering and performing treatments and interventions, ordering and review of laboratory studies, ordering and review of radiographic studies, pulse oximetry, re-evaluation of patient's condition, obtaining history from patient or surrogate and review of old charts   I assumed direction of critical care for this patient from another provider in my specialty: no     (including critical care time)  Medications Ordered in ED Medications  lactulose (CHRONULAC) 10 GM/15ML solution 30 g (has no administration in time range)  pantoprazole (PROTONIX) EC tablet 40 mg (40 mg Oral Given 03/27/18 0958)  tranexamic acid (LYSTEDA) tablet 1,300 mg (1,300 mg Oral Given 03/27/18 0957)  fluticasone (FLONASE) 50 MCG/ACT nasal spray 2 spray (2 sprays Each Nare Given 03/27/18 0958)  cefTRIAXone (ROCEPHIN) 2 g in sodium chloride 0.9 % 100 mL IVPB (has no administration in time range)  azithromycin (ZITHROMAX) 500 mg in sodium  chloride 0.9 % 250 mL IVPB (has no administration in time range)  albuterol (PROVENTIL) (2.5 MG/3ML) 0.083% nebulizer solution 2.5 mg (has no administration in time range)  benzonatate (TESSALON) capsule 100 mg (100 mg Oral Given 03/27/18 0957)  guaiFENesin (MUCINEX) 12 hr tablet 1,200 mg (1,200 mg Oral Given 03/27/18 0957)  guaiFENesin-dextromethorphan (ROBITUSSIN DM) 100-10 MG/5ML syrup 5 mL (has no administration in time range)  sodium chloride (PF) 0.9 % injection (has no administration in time range)  iopamidol (ISOVUE-370) 76 % injection (has no administration in time range)  metoprolol tartrate (LOPRESSOR) tablet 12.5 mg (12.5 mg Oral  Given 03/27/18 0957)  torsemide (DEMADEX) tablet 40 mg (40 mg Oral Given 03/27/18 0957)  sodium chloride 0.9 % bolus 1,000 mL (1,000 mLs Intravenous New Bag/Given 03/26/18 1545)  sodium chloride 0.9 % bolus 1,000 mL (1,000 mLs Intravenous New Bag/Given 03/26/18 1544)  iopamidol (ISOVUE-370) 76 % injection 100 mL (100 mLs Intravenous Contrast Given 03/26/18 1757)  potassium chloride SA (K-DUR,KLOR-CON) CR tablet 40 mEq (40 mEq Oral Given 03/27/18 0957)     Initial Impression / Assessment and Plan / ED Course  I have reviewed the triage vital signs and the nursing notes.  Pertinent labs & imaging results that were available during my care of the patient were reviewed by me and considered in my medical decision making (see chart for details).     Patient sent from PCP for evaluation of hypoxia and fever.  He is afebrile in the ED, hypoxic with improvement on 3 L via nasal cannula.  Vital signs otherwise stable.  Chest x-ray shows no acute cardiopulmonary abnormalities, EKG shows normal sinus rhythm.  No chest pain and I doubt ACS/MI.  Labs reviewed by me show leukocytosis, and elevated creatinine, and elevated lactic acid.  Code sepsis was initiated he was given IV fluids and broad-spectrum antibiotics for presumed respiratory infection given his symptoms.  Spoke  with Dr. Florene Glen with [facility] who agrees to some care of patient and bring him into the hospital for further evaluation and management.  He requests add-on of CTA chest to rule out PE and more definitively evaluate for possible pneumonia.  Patient seen and evaluate by Dr. Roderic Palau who agrees with assessment and plan at this time.  Final Clinical Impressions(s) / ED Diagnoses   Final diagnoses:  Sepsis with acute hypoxic respiratory failure without septic shock, due to unspecified organism [facility])    ED Discharge Orders    None       Debroah Baller 03/27/18 1243    Milton Ferguson, MD 03/27/18 1244

## 2018-03-26 NOTE — ED Triage Notes (Signed)
Patient is from home and transferred from PCP office today via [facility]. According to EMS, patient is experiencing shortness of breath, fever, and productive cough since Friday. When at PCP office, patients oxygen level was 85% on room air. Patient was 95%-96% on 4L via [place]. EMS noted crackles in lower lobes.

## 2018-03-26 NOTE — H&P (Signed)
History and Physical        Hospital Admission Note Date: 03/26/2018  Patient name: William [name] Medical record number: [id number] Date of birth: 12-12-1950 Age: 68 y.o. Gender: male  PCP: McGowen, Adrian Blackwater, MD    Patient coming from: PCPs office  I have reviewed all records in the [facility].    Chief Complaint:  Shortness of breath with fever, coughing for last 3 days  HPI: Patient is a 68 year old male with history of CAD, chronic diastolic CHF, [place] cirrhosis, hypertension, chronic iron deficiency anemia,  hyperlipidemia presented to ED with fever, productive coughing, shortness of breath for the last 3 days.  Patient went to his PCP today due to progressive worsening of his symptoms, had a fever of 101 F last night with congestion, productive cough with greenish sputum.  Felt very weak otherwise no body aches, nauseous due to coughing.  Also reported mild dyspnea on exertion.  Patient reported loss of appetite and generalized weakness and fatigue. While at PCPs office, O2 sat levels were 85% on room air, patient was placed on 4 L O2, sats improved to 95 to 96%.  No chest pain.  ED work-up/course:  In ED temp 98.8, respirate 16, pulse 81, BP 105/51, O2 sats 87% on room air  Review of Systems: Positives marked in 'bold' Constitutional: + fever, chills, diaphoresis, poor appetite and fatigue.  HEENT: Denies photophobia, eye pain, redness, hearing loss, ear pain, congestion, sore throat, rhinorrhea, sneezing, mouth sores, trouble swallowing, neck pain, neck stiffness and tinnitus.   Respiratory: Please see HPI Cardiovascular: Denies chest pain, palpitations and leg swelling.  Gastrointestinal: Denies nausea, vomiting, abdominal pain, diarrhea, constipation, blood in stool and abdominal distention.  Genitourinary: Denies dysuria, urgency, frequency, hematuria, flank  pain and difficulty urinating.  Musculoskeletal: Denies myalgias, back pain, joint swelling, arthralgias and gait problem.  Skin: Denies pallor, rash and wound.  Neurological: Denies dizziness, seizures, syncope,numbness and headaches. + Generalized weakness Hematological: Denies adenopathy. Easy bruising, personal or family bleeding history  Psychiatric/Behavioral: Denies suicidal ideation, mood changes, confusion, nervousness, sleep disturbance and agitation  Past Medical History: Past Medical History:  Diagnosis Date  . AAA (abdominal aortic aneurysm) ([place]) 03/2014   3.2 cm on MR abd.  F/u aortic u/s 06/2014 showed 3.0 x 3.1 cm AAA: recheck 2 yrs recommended (followed by cardiologist).  Minimal growth on 10/2016 CT abd done for epig pain.  Repeat u/s 10/2018.  [name] Anemia due to chronic blood loss 2018/19   GI: transfusions x >20 required; multiple endoscopies and bleeding scans unrevealing.  Hemolysis ruled out by hematologist.  Marshell Levan and octreotide + monthly iron infusions as of 04/2017.  . Bilateral renal cysts    simple (03/2014 MRI)  . CAD (coronary artery disease)   . Cholelithiases 2018   asymptomatic  . Chronic diastolic heart failure ([place])   . Cirrhosis ([place]) 05/2016   secondary to NASH; most recent u/s abd 07/2016 showed splenomegaly.  Hx of portal HTN changes with mild ascites and splenomegaly.  On lactulose as of 2019.  . Diabetes mellitus with complication ([place]) 64/4034   A1c 6.8%  . History of blood transfusion 2018 X 4 dates   "  low blood" (09/21/2016)  . Hyperlipemia, mixed    elevated LFTs when on statins.    . Hypertension    Cr bump 04/01/16 so I changed benicar-hct to benicar plain and added amlodipine 5 mg.  . Iron deficiency anemia 05/2016   Acute blood loss anemia: hospitalized, required transfusion x 3 U: colonoscopy and capsule study unrevealing.  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  W/u for ? hemolytic anemia to be pursued by hematologist as outpt.  Dr. [name], GI at [facility] following, too---he rec'd onc do bone marrow bx as of Jan 2019  . Lung field abnormal finding on examination    Bibasilar L>R mild insp crackles-->x-ray showed mild interstitial changes/fibrosis/scarring.  Changes noted on all CXRs in 2018/2019.  . Microscopic hematuria    Eval unremarkable by Dr. Eulogio Ditch.  [name] NASH (nonalcoholic steatohepatitis)    Fatty liver on MR abd 03/2014, + hx of elevated transaminases and bili: followed by Dr. [name].  . Obesity   . Spleen enlarged     Past Surgical History:  Procedure Laterality Date  . CARDIAC CATHETERIZATION    . CARDIOVASCULAR STRESS TEST  01/17/2012; 05/04/16   Normal stress nuclear study x 2 (2018--Normal perfusion. LVEF 66% with normal wall motion. This is a low risk study).  . CERVICAL DISCECTOMY  1992  . COLONOSCOPY N/A 05/27/2016   No site/explanation for blood loss found.  Erythematous mucosa in cecum and ascending colon--Cecal bx: normal.  Procedure: COLONOSCOPY;  Surgeon: Carol Ada, MD;  Location: [facility] ENDOSCOPY;  Service: Endoscopy;  Laterality: N/A;  . COLONOSCOPY W/ POLYPECTOMY  09/10/2014   Polypectomy x 2: recall 5 yrs (Dr. [name]).  . CORONARY ANGIOPLASTY    . CORONARY ARTERY BYPASS GRAFT  03/2006  . ENTEROSCOPY N/A 07/31/2016   Procedure: ENTEROSCOPY;  Surgeon: Ladene Artist, MD;  Location: [facility] ENDOSCOPY;  Service: Endoscopy;  Laterality: N/A;  . ENTEROSCOPY N/A 12/02/2016   Procedure: ENTEROSCOPY;  Surgeon: Carol Ada, MD;  Location: [facility] ENDOSCOPY;  Service: Endoscopy;  Laterality: N/A;  . ESOPHAGOGASTRODUODENOSCOPY N/A 05/25/2016   No site/explanation for blood loss found.  Procedure: ESOPHAGOGASTRODUODENOSCOPY (EGD);  Surgeon: Juanita Craver, MD;  Location: [facility] ENDOSCOPY;  Service: Endoscopy;  Laterality: N/A;  . ESOPHAGOGASTRODUODENOSCOPY N/A 09/23/2016   Procedure: ESOPHAGOGASTRODUODENOSCOPY (EGD);  Surgeon: Carol Ada, MD;   Location: [place];  Service: Endoscopy;  Laterality: N/A;  . GIVENS CAPSULE STUDY N/A 05/27/2016   No source identified.  Repeat 10/2016--results pending.  Procedure: GIVENS CAPSULE STUDY;  Surgeon: Carol Ada, MD;  Location: [facility] ENDOSCOPY;  Service: Endoscopy;  Laterality: N/A;  . GIVENS CAPSULE STUDY N/A 10/15/2016   Procedure: GIVENS CAPSULE STUDY;  Surgeon: Carol Ada, MD;  Location: [facility] ENDOSCOPY;  Service: Endoscopy;  Laterality: N/A;  . GIVENS CAPSULE STUDY N/A 02/13/2017   Procedure: GIVENS CAPSULE STUDY;  Surgeon: Carol Ada, MD;  Location: [place];  Service: Endoscopy;  Laterality: N/A;  . NECK SURGERY  1991  . NM GI BLOOD LOSS  01/27/2017   NEGATIVE  . SMALL BOWEL ENTEROSCOPY  10/2016   ([facility], Dr. [name]: Double balloon enteroscopy--to the level of proximal ileum) jejunal polyps- which were removed--not bleeding & benign.  No source of bleeding was identified.  . TRANSTHORACIC ECHOCARDIOGRAM  05/25/2016; 10/2017   05/2016: EF 60%, normal wall motion, grd II DD, mild aortic stenosis, dilated aortic root and ascending aorta. 10/2017: Mild LVH; no mention made of LV function.  Moderate aorticstenosis with mean gradient 19 peak gradient 41. AVA 1.1 cm^2.  Mild ascending aorta dilation.  Mean gradient slightly lower than in 2018.  [name] VASECTOMY  1977    Medications: Prior to Admission medications   Medication Sig Start Date End Date Taking? Authorizing Provider  fluticasone (FLONASE) 50 MCG/ACT nasal spray Place 2 sprays into both nostrils daily. Patient taking differently: Place 2 sprays into both nostrils daily as needed for allergies.  01/01/16  Yes Withrow, Elyse Jarvis, FNP  metoprolol tartrate (LOPRESSOR) 25 MG tablet TAKE 1/2 TABLET BY MOUTH TWICE A DAY Patient taking differently: Take 12.5 mg by mouth 2 (two) times daily.  01/23/18  Yes McGowen, Adrian Blackwater, MD  ondansetron (ZOFRAN) 4 MG tablet Take 1 tablet (4 mg total) by mouth every 8 (eight) hours as needed for nausea or vomiting.  04/05/17  Yes McGowen, Adrian Blackwater, MD  pantoprazole (PROTONIX) 40 MG tablet TAKE 1 TABLET BY MOUTH TWICE DAILY BEFORE A MEAL Patient taking differently: Take 40 mg by mouth 2 (two) times daily.  11/16/17  Yes McGowen, Adrian Blackwater, MD  potassium chloride SA (K-DUR,KLOR-CON) 20 MEQ tablet TAKE 1 TABLET (20 MEQ TOTAL) BY MOUTH TWICE A DAY Patient taking differently: Take 20 mEq by mouth daily.  01/10/18  Yes McGowen, Adrian Blackwater, MD  spironolactone (ALDACTONE) 25 MG tablet Take 12.5 mg by mouth daily.   Yes [provider]  torsemide (DEMADEX) 20 MG tablet TAKE 2 TABLETS BY MOUTH DAILY. Patient taking differently: Take 40 mg by mouth daily.  03/23/18  Yes McGowen, Adrian Blackwater, MD  tranexamic acid (LYSTEDA) 650 MG TABS tablet TAKE 2 TABLETS BY MOUTH 3 TIMES DAILY. Patient taking differently: Take 1,300 mg by mouth 3 (three) times daily.  02/23/18  Yes Brunetta Genera, MD  lactulose (CHRONULAC) 10 GM/15ML solution Take 45 mLs (30 g total) by mouth 3 (three) times daily. Take this until you have three BM's a day. Patient taking differently: Take 30 g by mouth 2 (two) times daily as needed for mild constipation. Take this until you have three BM's a day. 05/11/17   Deno Etienne, DO    Allergies:   Allergies  Allergen Reactions  . Statins Other (See Comments)    Liver enzymes go up whenever on them    Social History:  reports that he has quit smoking. His smoking use included cigarettes. He has never used smokeless tobacco. He reports that he does not drink alcohol or use drugs.  Family History: Family History  Problem Relation Age of Onset  . Hypertension Mother   . Cancer - Other Mother        liver cancer  . Heart disease Father   . Heart attack Father   . Cancer - Lung Father   . Diabetes Father   . Liver disease Sister   . Anemia Sister   . Heart disease Sister   . Heart attack Sister   . Heart disease Brother        CABG 20 YEARS AGO  . Hypertension Brother   . Mesothelioma Brother         HALF-BROTHER  . Cancer - Other Brother        CLL  . Cancer - Lung Brother        Mets to brain  . Cancer - Lung Sister        HALF-SISTER  . Liver disease Brother   . Heart disease Brother  CABG-2012  . Emphysema Maternal Grandfather   . Cancer - Other Paternal Grandmother        Stomach  . Heart attack Paternal Grandfather     Physical Exam: Blood pressure (!) 105/51, pulse 81, temperature 98.8 F (37.1 C), temperature source Oral, resp. rate 20, height _0  (1.753 m), weight 104.8 kg, SpO2 96 %. General: Alert, awake, oriented x3, in no acute distress, coughing during the encounter. Eyes: pink conjunctiva,anicteric sclera, pupils equal and reactive to light and accomodation, HEENT: normocephalic, atraumatic, oropharynx clear Neck: supple, no masses or lymphadenopathy, no goiter, no bruits, no JVD CVS: Regular rate and rhythm, without murmurs, rubs or gallops. No lower extremity edema Resp : Bibasilar crackles with scattered rhonchi no wheezing GI : Soft, nontender, nondistended, positive bowel sounds, no masses. No hepatomegaly. No hernia.  Musculoskeletal: No clubbing or cyanosis, positive pedal pulses. No contracture. ROM intact  Neuro: Grossly intact, no focal neurological deficits, strength 5/5 upper and lower extremities bilaterally Psych: alert and oriented x 3, normal mood and affect Skin: no rashes or lesions, warm and dry   LABS on Admission: I have personally reviewed all the labs and imagings below    Basic Metabolic Panel: Recent Labs  Lab 03/26/18 1423  NA 131*  K 3.6  CL 98  CO2 23  GLUCOSE 137*  BUN 16  CREATININE 1.36*  CALCIUM 8.0*   Liver Function Tests: No results for input(s): AST, ALT, ALKPHOS, BILITOT, PROT, ALBUMIN in the last 168 hours. No results for input(s): LIPASE, AMYLASE in the last 168 hours. No results for input(s): AMMONIA in the last 168 hours. CBC: Recent Labs  Lab 03/26/18 1423  WBC 12.3*  NEUTROABS 9.5*    HGB 12.1*  HCT 37.3*  MCV 97.4  PLT 70*   Cardiac Enzymes: No results for input(s): CKTOTAL, CKMB, CKMBINDEX, TROPONINI in the last 168 hours. BNP: Invalid input(s): POCBNP CBG: No results for input(s): GLUCAP in the last 168 hours.  Radiological Exams on Admission:  Dg Chest 2 View  Result Date: 03/26/2018 CLINICAL DATA:  Patient is from home and transferred from PCP office today via [facility]. According to EMS, patient is experiencing shortness of breath, fever, and productive cough since Friday. When at PCP office, patients oxygen level was 85% on room airFormer smoker - diabetic - hx heart surgery EXAM: CHEST - 2 VIEW COMPARISON:  07/28/2017 FINDINGS: There are stable changes from prior CABG surgery. The cardiac silhouette is normal in size and configuration. No mediastinal or hilar masses. No convincing adenopathy. There are prominent bronchovascular and interstitial markings that are stable from the prior exam. No evidence of pneumonia or pulmonary edema. No pleural effusion or pneumothorax. Skeletal structures are intact. IMPRESSION: 1. No acute cardiopulmonary disease. 2. Chronically prominent bronchovascular markings and interstitial thickening stable from the prior exam. Electronically Signed   By: Lajean Manes M.D.   On: 03/26/2018 15:13      EKG: Independently reviewed.  Rate 85, normal sinus rhythm, borderline prolonged QT   Assessment/Plan Principal Problem:   Acute respiratory failure with hypoxia (HCC) secondary to sepsis, clinical community-acquired pneumonia -Patient met sepsis criteria at the time of admission due to hypotension, hypoxia, lactic acidosis, leukocytosis, source likely due to pneumonia -Influenza panel negative -Follow blood cultures, sputum cultures, urine Legionella antigen, urine strep antigen -Patient has received IV fluid hydration in ED, will continue for 1 L then [facility], has a history of CHF, follow volume status closely -Continue IV  Rocephin, Zithromax,  albuterol nebs as needed, O2 as tolerated to keep sats above 92% -CT angiogram of the chest has been ordered by EDP for hypoxia, will follow results  Active Problems:   Essential hypertension -BP currently borderline -Hold metoprolol, Aldactone, Demadex    Non-insulin treated type 2 diabetes mellitus (HCC) -Diet controlled, patient is not on any oral hypoglycemics outpatient    Chronic diastolic CHF (congestive heart failure) (HCC) -Follow volume status closely, patient has received IV fluids in ED, hold torsemide, spironolactone today due to acute kidney injury, sepsis  Acute kidney injury, hyponatremia with lactic acidosis likely due to severe dehydration from sepsis, poor oral intake -Baseline creatinine 1.0, presented with creatinine of 1.3 -Patient has received IV fluid hydration, will continue for 1 L, hold spironolactone and Demadex today -Follow bmet    Cirrhosis, nonalcoholic ([place]) -Patient has a history of Karlene Lineman, had more of an increase in his chronically elevated bilirubin. -Per wife, patient has outpatient ultrasound ordered to follow-up with Dr. [name] with GI, requested if ultrasound can be done while patient is admitted -N.p.o. after midnight for abdominal ultrasound  Chronic thrombocytopenia due to cirrhosis - platelet count slightly worse due to sepsis, follow closely  DVT prophylaxis: SCDs  CODE STATUS: Full code  Consults called: None  Family Communication: Admission, patients condition and plan of care including tests being ordered have been discussed with the patient, wife and son who indicates understanding and agree with the plan and Code Status  Admission status:   Disposition plan: Further plan will depend as patient's clinical course evolves and further radiologic and laboratory data become available.    At the time of admission, it appears that the appropriate admission status for this patient is INPATIENT . This is judged to be  reasonable and necessary in order to provide the required intensity of service to ensure the patient's safety given the presenting symptoms with hypoxia, hypotension, acute kidney injury, clinical pneumonia, physical exam findings, and initial radiographic and laboratory data in the context of their chronic comorbidities.  The medical decision making on this patient was of high complexity and the patient is at high risk for clinical deterioration, therefore this is a level 3 visit.   Time Spent on Admission: 65 minutes    Nishant Schrecengost M.D. [facility] 03/26/2018, 5:37 PM

## 2018-03-26 NOTE — Addendum Note (Signed)
Addended by: Onalee Hua on: 03/26/2018 12:20 PM   Modules accepted: Orders

## 2018-03-26 NOTE — Progress Notes (Signed)
OFFICE VISIT  03/26/2018   CC:  Chief Complaint  Patient presents with  . URI    fever, productive cough   HPI:    Patient is a 68 y.o. Caucasian male who presents accompanied by his wife for cough and fever. Onset 3-4 d/a, mild mucous production that appears dull yellow, thick. Temp 99-100 until last night it went to 101.2. Some mild nasal congestion, no PND.  No ST or HA. No body aches except feels sore/achy in thoracic region with all the coughing.  Up most of the night from coughing. Feels mild DOE with this.  No palpitations or lightheaded feeling. ++Fatigue.  Trying to drink a lot of fluids.  Appetite is down.  No change in LE swelling or abd swelling.  Recently had more of an increase in his mild chronically elevated bilirubin, has an abd ultrasound ordered under the care of Dr. [name] with GI.  After u/s is back the plan is for Dr. [name] to get him in at [facility] for consideration of liver transplant (per pt report today).  Since getting consistently on octreotide his blood counts have consistently improved and he denies melena. He has felt very good lately up until this hit him 4 d/a.  ROS: no CP, no wheezing, no dizziness, no HAs, no rashes, no melena/hematochezia.  No polyuria or polydipsia.  No myalgias or arthralgias.  No abd pain.   Past Medical History:  Diagnosis Date  . AAA (abdominal aortic aneurysm) ([place]) 03/2014   3.2 cm on MR abd.  F/u aortic u/s 06/2014 showed 3.0 x 3.1 cm AAA: recheck 2 yrs recommended (followed by cardiologist).  Minimal growth on 10/2016 CT abd done for epig pain.  Repeat u/s 10/2018.  [name] Anemia due to chronic blood loss 2018/19   GI: transfusions x >20 required; multiple endoscopies and bleeding scans unrevealing.  Hemolysis ruled out by hematologist.  Marshell Levan and octreotide + monthly iron infusions as of 04/2017.  . Bilateral renal cysts    simple (03/2014 MRI)  . CAD (coronary artery disease)   . Cholelithiases 2018   asymptomatic  . Chronic  diastolic heart failure ([place])   . Cirrhosis ([place]) 05/2016   secondary to NASH; most recent u/s abd 07/2016 showed splenomegaly.  Hx of portal HTN changes with mild ascites and splenomegaly.  On lactulose as of 2019.  . Diabetes mellitus with complication ([place]) 40/1027   A1c 6.8%  . History of blood transfusion 2018 X 4 dates   "low blood" (09/21/2016)  . Hyperlipemia, mixed    elevated LFTs when on statins.    . Hypertension    Cr bump 04/01/16 so I changed benicar-hct to benicar plain and added amlodipine 5 mg.  . Iron deficiency anemia 05/2016   Acute blood loss anemia: hospitalized, required transfusion x 3 U: colonoscopy and capsule study unrevealing.  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  W/u for ? hemolytic anemia to be pursued by hematologist as outpt.  Dr. [name], GI at [facility] following, too---he rec'd onc do bone marrow bx as of Jan 2019  . Lung field abnormal finding on examination    Bibasilar L>R mild insp crackles-->x-ray showed mild interstitial changes/fibrosis/scarring.  Changes noted on all CXRs in 2018/2019.  . Microscopic hematuria    Eval unremarkable by Dr. Eulogio Ditch.  [name] NASH (nonalcoholic steatohepatitis)    Fatty liver on MR abd 03/2014, + hx of elevated transaminases  and bili: followed by Dr. [name].  . Obesity   . Spleen enlarged     Past Surgical History:  Procedure Laterality Date  . CARDIAC CATHETERIZATION    . CARDIOVASCULAR STRESS TEST  01/17/2012; 05/04/16   Normal stress nuclear study x 2 (2018--Normal perfusion. LVEF 66% with normal wall motion. This is a low risk study).  . CERVICAL DISCECTOMY  1992  . COLONOSCOPY N/A 05/27/2016   No site/explanation for blood loss found.  Erythematous mucosa in cecum and ascending colon--Cecal bx: normal.  Procedure: COLONOSCOPY;  Surgeon: Carol Ada, MD;  Location: [facility] ENDOSCOPY;  Service: Endoscopy;  Laterality: N/A;  . COLONOSCOPY  W/ POLYPECTOMY  09/10/2014   Polypectomy x 2: recall 5 yrs (Dr. [name]).  . CORONARY ANGIOPLASTY    . CORONARY ARTERY BYPASS GRAFT  03/2006  . ENTEROSCOPY N/A 07/31/2016   Procedure: ENTEROSCOPY;  Surgeon: Ladene Artist, MD;  Location: [facility] ENDOSCOPY;  Service: Endoscopy;  Laterality: N/A;  . ENTEROSCOPY N/A 12/02/2016   Procedure: ENTEROSCOPY;  Surgeon: Carol Ada, MD;  Location: [facility] ENDOSCOPY;  Service: Endoscopy;  Laterality: N/A;  . ESOPHAGOGASTRODUODENOSCOPY N/A 05/25/2016   No site/explanation for blood loss found.  Procedure: ESOPHAGOGASTRODUODENOSCOPY (EGD);  Surgeon: Juanita Craver, MD;  Location: [facility] ENDOSCOPY;  Service: Endoscopy;  Laterality: N/A;  . ESOPHAGOGASTRODUODENOSCOPY N/A 09/23/2016   Procedure: ESOPHAGOGASTRODUODENOSCOPY (EGD);  Surgeon: Carol Ada, MD;  Location: [place];  Service: Endoscopy;  Laterality: N/A;  . GIVENS CAPSULE STUDY N/A 05/27/2016   No source identified.  Repeat 10/2016--results pending.  Procedure: GIVENS CAPSULE STUDY;  Surgeon: Carol Ada, MD;  Location: [facility] ENDOSCOPY;  Service: Endoscopy;  Laterality: N/A;  . GIVENS CAPSULE STUDY N/A 10/15/2016   Procedure: GIVENS CAPSULE STUDY;  Surgeon: Carol Ada, MD;  Location: [facility] ENDOSCOPY;  Service: Endoscopy;  Laterality: N/A;  . GIVENS CAPSULE STUDY N/A 02/13/2017   Procedure: GIVENS CAPSULE STUDY;  Surgeon: Carol Ada, MD;  Location: [place];  Service: Endoscopy;  Laterality: N/A;  . NECK SURGERY  1991  . NM GI BLOOD LOSS  01/27/2017   NEGATIVE  . SMALL BOWEL ENTEROSCOPY  10/2016   ([facility], Dr. [name]: Double balloon enteroscopy--to the level of proximal ileum) jejunal polyps- which were removed--not bleeding & benign.  No source of bleeding was identified.  . TRANSTHORACIC ECHOCARDIOGRAM  05/25/2016; 10/2017   05/2016: EF 60%, normal wall motion, grd II DD, mild aortic stenosis, dilated aortic root and ascending aorta. 10/2017: Mild LVH; no mention made of LV function.  Moderate aorticstenosis with mean  gradient 19 peak gradient 41. AVA 1.1 cm^2.  Mild ascending aorta dilation.  Mean gradient slightly lower than in 2018.  [name] VASECTOMY  1977    Outpatient Medications Prior to Visit  Medication Sig Dispense Refill  . lactulose (CHRONULAC) 10 GM/15ML solution Take 45 mLs (30 g total) by mouth 3 (three) times daily. Take this until you have three BM's a day. 240 mL 0  . metoprolol tartrate (LOPRESSOR) 25 MG tablet TAKE 1/2 TABLET BY MOUTH TWICE A DAY 30 tablet 5  . ondansetron (ZOFRAN) 4 MG tablet Take 1 tablet (4 mg total) by mouth every 8 (eight) hours as needed for nausea or vomiting. 10 tablet 1  . pantoprazole (PROTONIX) 40 MG tablet TAKE 1 TABLET BY MOUTH TWICE DAILY BEFORE A MEAL 60 tablet 3  . potassium chloride SA (K-DUR,KLOR-CON) 20 MEQ tablet TAKE 1 TABLET (20 MEQ TOTAL) BY MOUTH TWICE A DAY 60 tablet 6  . spironolactone (ALDACTONE) 25 MG tablet Take  12.5 mg by mouth daily.    [name] torsemide (DEMADEX) 20 MG tablet TAKE 2 TABLETS BY MOUTH DAILY. 60 tablet 3  . tranexamic acid (LYSTEDA) 650 MG TABS tablet TAKE 2 TABLETS BY MOUTH 3 TIMES DAILY. 180 tablet 1  . acetaminophen (TYLENOL) 325 MG tablet Take 325 mg by mouth every 6 (six) hours as needed for fever (for fever/pain.).     [name] cholecalciferol (VITAMIN D) 1000 units tablet Take 1,000 Units by mouth daily.    . fluticasone (FLONASE) 50 MCG/ACT nasal spray Place 2 sprays into both nostrils daily. (Patient not taking: Reported on 03/26/2018) 16 g 6   Facility-Administered Medications Prior to Visit  Medication Dose Route Frequency Provider Last Rate Last Dose  . heparin lock flush 100 unit/mL  500 Units Intracatheter Daily PRN Truitt Merle, MD      . sodium chloride flush (NS) 0.9 % injection 10 mL  10 mL Intracatheter PRN Truitt Merle, MD        Allergies  Allergen Reactions  . Statins Other (See Comments)    Liver enzymes go up whenever on them    ROS As per HPI  PE: Blood pressure 110/67, pulse 81, temperature 98.8 F (37.1 C),  temperature source Oral, resp. rate 16, height 5' 8.5" (1.74 m), weight 231 lb 12.8 oz (105.1 kg), SpO2 (!) 87 %.Oxygen sats on RA was 82-88 on RA, went to 90% on 2L oxygen,  Gen: alert, appears tired, mild scleral icterus/jaundice. AFFECT: pleasant, lucid thought and speech. XAJ:OINO: no injection, icteris, swelling, or exudate.  EOMI, PERRLA. Mouth: lips without lesion/swelling.  Oral mucosa pink and moist. Oropharynx without erythema, exudate, or swelling.  CV: RRR, 2-3/6 syst murmur.  No diastolic murmur.  No r/g. ABD: soft, NT.  Moderately rotund, +flank dullness to percussion.   LUNGS: bibasilar insp crackles, nonlabored resps. EXT: no clubbing or cyanosis.  no edema.    LABS:    Chemistry      Component Value Date/Time   NA 136 03/14/2018 0928   NA 139 02/10/2017 0948   K 3.7 03/14/2018 0928   K 3.9 02/10/2017 0948   CL 106 03/14/2018 0928   CO2 23 03/14/2018 0928   CO2 24 02/10/2017 0948   BUN 11 03/14/2018 0928   BUN 10.4 02/10/2017 0948   CREATININE 1.09 03/14/2018 0928   CREATININE 1.3 02/10/2017 0948      Component Value Date/Time   CALCIUM 8.5 (L) 03/14/2018 0928   CALCIUM 8.9 02/10/2017 0948   ALKPHOS 136 (H) 03/14/2018 0928   ALKPHOS 108 02/10/2017 0948   AST 50 (H) 03/14/2018 0928   AST 30 02/10/2017 0948   ALT 39 03/14/2018 0928   ALT 23 02/10/2017 0948   BILITOT 7.8 (HH) 03/14/2018 0928   BILITOT 4.97 (HH) 02/10/2017 0948     Lab Results  Component Value Date   WBC 6.7 03/14/2018   HGB 12.1 (L) 03/14/2018   HCT 35.8 (L) 03/14/2018   MCV 93.2 03/14/2018   PLT 93 (L) 03/14/2018   Rapid flu A/B today: NEG  IMPRESSION AND PLAN:  1) Acute bronchopneumonia with mild hypoxia-->oxygen 92% on 3L oxygen [place] and pt feeling mild improvement in breathing. Remainder of VS normal. Pt needs further eval for this in the ED and most likely admission to hospital for oxygen support and close monitoring, esp given his comorbid medical conditions. Discussed with pt  and his wife and they are in agreement and they are agreeable to EMS transporting him so  he can get oxygen and monitoring on the way to hospital. (Rapid flu testing here-->NEGATIVE. We did not give the patient any medications while he was here today.)  Spent 45 min with pt today, with >50% of this time spent in counseling and care coordination regarding the above problems.  An After Visit Summary was printed and given to the patient.  FOLLOW UP: Return for f/u to be determined based on ED/hospital eval.  Signed:  Crissie Sickles, MD           03/26/2018

## 2018-03-26 NOTE — Progress Notes (Signed)
ED TO INPATIENT HANDOFF REPORT  Name/Age/Gender William [name] 68 y.o. male  Code Status Code Status History    Date Active Date Inactive Code Status Order ID Comments User Context   11/30/2016 1949 12/03/2016 1156 Full Code [id number]  Hosie Poisson, MD Inpatient   10/14/2016 1624 10/16/2016 1429 Full Code [id number]  Rosita Fire, MD ED   09/21/2016 1257 09/23/2016 1802 Full Code [id number]  Waldemar Dickens, MD ED   08/30/2016 1444 09/03/2016 1740 Full Code [id number]  Jani Gravel, MD Inpatient   07/29/2016 1126 08/01/2016 1738 Full Code [id number]  Radene Gunning, NP ED   05/22/2016 0922 05/29/2016 1514 Full Code [id number]  Elwin Mocha, MD ED      Home/SNF/Other Home  Chief Complaint Flu Sx; SOB  Level of Care/Admitting Diagnosis ED Disposition    ED Disposition Condition [facility] Area: [place] [[id number]]  Level of Care: Telemetry [5]  Admit to tele based on following criteria: Complex arrhythmia (Bradycardia/Tachycardia)  Diagnosis: SOB (shortness of breath) [[id number]]  Admitting Physician: RAI, Arnold K [[id number]]  Attending Physician: RAI, RIPUDEEP K [[id number]]  Estimated length of stay: 5 - 7 days  Certification:: I certify this patient will need inpatient services for at least 2 midnights  PT Class (Do Not Modify): Inpatient [101]  PT Acc Code (Do Not Modify): Private [1]       Medical History Past Medical History:  Diagnosis Date  . AAA (abdominal aortic aneurysm) ([place]) 03/2014   3.2 cm on MR abd.  F/u aortic u/s 06/2014 showed 3.0 x 3.1 cm AAA: recheck 2 yrs recommended (followed by cardiologist).  Minimal growth on 10/2016 CT abd done for epig pain.  Repeat u/s 10/2018.  [name] Anemia due to chronic blood loss 2018/19   GI: transfusions x >20 required; multiple endoscopies and bleeding scans unrevealing.  Hemolysis ruled out by hematologist.  Marshell Levan and octreotide + monthly iron infusions as of 04/2017.  . Bilateral renal cysts     simple (03/2014 MRI)  . CAD (coronary artery disease)   . Cholelithiases 2018   asymptomatic  . Chronic diastolic heart failure ([place])   . Cirrhosis ([place]) 05/2016   secondary to NASH; most recent u/s abd 07/2016 showed splenomegaly.  Hx of portal HTN changes with mild ascites and splenomegaly.  On lactulose as of 2019.  . Diabetes mellitus with complication ([place]) 81/4481   A1c 6.8%  . History of blood transfusion 2018 X 4 dates   "low blood" (09/21/2016)  . Hyperlipemia, mixed    elevated LFTs when on statins.    . Hypertension    Cr bump 04/01/16 so I changed benicar-hct to benicar plain and added amlodipine 5 mg.  . Iron deficiency anemia 05/2016   Acute blood loss anemia: hospitalized, required transfusion x 3 U: colonoscopy and capsule study unrevealing.  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  W/u for ? hemolytic anemia to be pursued by hematologist as outpt.  Dr. [name], GI at [facility] following, too---he rec'd onc do bone marrow bx as of Jan 2019  . Lung field abnormal finding on examination    Bibasilar L>R mild insp crackles-->x-ray showed mild interstitial changes/fibrosis/scarring.  Changes noted on all CXRs in 2018/2019.  . Microscopic hematuria    Eval unremarkable by Dr. Eulogio Ditch.  [name] NASH (nonalcoholic steatohepatitis)    Fatty liver on MR abd 03/2014, +  hx of elevated transaminases and bili: followed by Dr. [name].  . Obesity   . Spleen enlarged     Allergies Allergies  Allergen Reactions  . Statins Other (See Comments)    Liver enzymes go up whenever on them    IV Location/Drains/Wounds Patient Lines/Drains/Airways Status   Active Line/Drains/Airways    Name:   Placement date:   Placement time:   Site:   Days:   Peripheral IV 03/26/18 Right Antecubital   03/26/18    1423    Antecubital   less than 1   Peripheral IV 03/26/18 Left Antecubital   03/26/18    1548    Antecubital   less  than 1          Labs/Imaging Results for orders placed or performed during the hospital encounter of 03/26/18 (from the past 48 hour(s))  Basic metabolic panel     Status: Abnormal   Collection Time: 03/26/18  2:23 PM  Result Value Ref Range   Sodium 131 (L) 135 - 145 mmol/L   Potassium 3.6 3.5 - 5.1 mmol/L   Chloride 98 98 - 111 mmol/L   CO2 23 22 - 32 mmol/L   Glucose, Bld 137 (H) 70 - 99 mg/dL   BUN 16 8 - 23 mg/dL   Creatinine, Ser 1.36 (H) 0.61 - 1.24 mg/dL   Calcium 8.0 (L) 8.9 - 10.3 mg/dL   GFR calc non Af Amer 53 (L) >60 mL/min   GFR calc Af Amer >60 >60 mL/min   Anion gap 10 5 - 15    Comment: Performed at [facility], [place] [address] [place], [place] [address]  CBC with Differential     Status: Abnormal   Collection Time: 03/26/18  2:23 PM  Result Value Ref Range   WBC 12.3 (H) 4.0 - 10.5 K/uL   RBC 3.83 (L) 4.22 - 5.81 MIL/uL   Hemoglobin 12.1 (L) 13.0 - 17.0 g/dL   HCT 37.3 (L) 39.0 - 52.0 %   MCV 97.4 80.0 - 100.0 fL   MCH 31.6 26.0 - 34.0 pg   MCHC 32.4 30.0 - 36.0 g/dL   RDW 18.9 (H) 11.5 - 15.5 %   Platelets 70 (L) 150 - 400 K/uL    Comment: Immature Platelet Fraction may be clinically indicated, consider ordering this additional test [id number]    nRBC 0.0 0.0 - 0.2 %   Neutrophils Relative % 76 %   Neutro Abs 9.5 (H) 1.7 - 7.7 K/uL   Lymphocytes Relative 4 %   Lymphs Abs 0.5 (L) 0.7 - 4.0 K/uL   Monocytes Relative 16 %   Monocytes Absolute 2.0 (H) 0.1 - 1.0 K/uL   Eosinophils Relative 2 %   Eosinophils Absolute 0.2 0.0 - 0.5 K/uL   Basophils Relative 1 %   Basophils Absolute 0.1 0.0 - 0.1 K/uL   Immature Granulocytes 1 %   Abs Immature Granulocytes 0.08 (H) 0.00 - 0.07 K/uL    Comment: Performed at [facility], [place] [address], [place], [place] [address]  Lactic acid, plasma     Status: Abnormal   Collection Time: 03/26/18  2:23 PM  Result Value Ref Range   Lactic Acid, Venous 2.7 (HH) 0.5 - 1.9 mmol/L     Comment: CRITICAL RESULT CALLED TO, READ BACK BY AND VERIFIED WITH: Ouida Sills [id number] @ 8127 BY Martina Sinner Performed at [place] [address] [place], [place] [address]   I-Stat Troponin, ED (  not at [facility])     Status: None   Collection Time: 03/26/18  2:32 PM  Result Value Ref Range   Troponin i, poc 0.01 0.00 - 0.08 ng/mL   Comment 3            Comment: Due to the release kinetics of cTnI, a negative result within the first hours of the onset of symptoms does not rule out myocardial infarction with certainty. If myocardial infarction is still suspected, repeat the test at appropriate intervals.   Lactic acid, plasma     Status: Abnormal   Collection Time: 03/26/18  3:40 PM  Result Value Ref Range   Lactic Acid, Venous 2.9 (HH) 0.5 - 1.9 mmol/L    Comment: CRITICAL RESULT CALLED TO, READ BACK BY AND VERIFIED WITH: Ouida Sills [id number] @ [place] Performed at [place] [address], [place], [place] [address]   Influenza panel by PCR (type A & B)     Status: None   Collection Time: 03/26/18  3:50 PM  Result Value Ref Range   Influenza A By PCR NEGATIVE NEGATIVE   Influenza B By PCR NEGATIVE NEGATIVE    Comment: (NOTE) The Xpert Xpress Flu assay is intended as an aid in the diagnosis of  influenza and should not be used as a sole basis for treatment.  This  assay is FDA approved for nasopharyngeal swab specimens only. Nasal  washings and aspirates are unacceptable for Xpert Xpress Flu testing. Performed at [facility], [place] [address] [place], [place] [address]    Dg Chest 2 View  Result Date: 03/26/2018 CLINICAL DATA:  Patient is from home and transferred from PCP office today via [facility]. According to EMS, patient is experiencing shortness of breath, fever, and productive cough since Friday. When at PCP office, patients oxygen level was 85% on room airFormer smoker - diabetic - hx  heart surgery EXAM: CHEST - 2 VIEW COMPARISON:  07/28/2017 FINDINGS: There are stable changes from prior CABG surgery. The cardiac silhouette is normal in size and configuration. No mediastinal or hilar masses. No convincing adenopathy. There are prominent bronchovascular and interstitial markings that are stable from the prior exam. No evidence of pneumonia or pulmonary edema. No pleural effusion or pneumothorax. Skeletal structures are intact. IMPRESSION: 1. No acute cardiopulmonary disease. 2. Chronically prominent bronchovascular markings and interstitial thickening stable from the prior exam. Electronically Signed   By: Lajean Manes M.D.   On: 03/26/2018 15:13    Pending Labs Unresulted Labs (From admission, onward)    Start     Ordered   03/26/18 1531  Blood Culture (routine x 2)  BLOOD CULTURE X 2,   STAT     03/26/18 1531   03/26/18 1531  Urinalysis, Routine w reflex microscopic  ONCE - STAT,   STAT     03/26/18 1531          Vitals/Pain Today's Vitals   03/26/18 1530 03/26/18 1638 03/26/18 1700 03/26/18 1730  BP: 127/65 122/63 124/60 (!) 105/51  Pulse: 80 83 84 81  Resp: 20 (!) 22 (!) 22 20  Temp:      TempSrc:      SpO2: 95% 94% 93% 96%  Weight:      Height:      PainSc:        Isolation Precautions Droplet precaution  Medications Medications  sodium chloride 0.9 % bolus 1,000 mL (1,000 mLs Intravenous New Bag/Given 03/26/18  1544)    And  sodium chloride 0.9 % bolus 1,000 mL (has no administration in time range)    And  sodium chloride 0.9 % bolus 500 mL (has no administration in time range)  cefTRIAXone (ROCEPHIN) 2 g in sodium chloride 0.9 % 100 mL IVPB (2 g Intravenous New Bag/Given 03/26/18 1555)  azithromycin (ZITHROMAX) 500 mg in sodium chloride 0.9 % 250 mL IVPB (500 mg Intravenous New Bag/Given 03/26/18 1703)  sodium chloride 0.9 % bolus 1,000 mL (1,000 mLs Intravenous New Bag/Given 03/26/18 1545)    Mobility walks with device

## 2018-03-26 NOTE — ED Notes (Signed)
Patient transported to CT 

## 2018-03-26 NOTE — Plan of Care (Signed)
  Problem: Education: Goal: Knowledge of General Education information will improve Description Including pain rating scale, medication(s)/side effects and non-pharmacologic comfort measures Outcome: Progressing   

## 2018-03-26 NOTE — ED Notes (Signed)
Date and time results received: 03/26/18 1459 (use smartphrase ".now" to insert current time)  Test: lactic acid Critical Value: 2.7  Name of Provider Notified: Rodell Perna  Orders Received? Or Actions Taken?: Orders Received - See Orders for details and Actions Taken: notified Jemario Poitras that lactic acid 2.7.

## 2018-03-26 NOTE — ED Notes (Signed)
Report called to receiving RN.

## 2018-03-27 ENCOUNTER — Other Ambulatory Visit: Payer: Self-pay

## 2018-03-27 ENCOUNTER — Inpatient Hospital Stay (HOSPITAL_COMMUNITY): Payer: 59

## 2018-03-27 LAB — CBC
HCT: 34.2 % — ABNORMAL LOW (ref 39.0–52.0)
Hemoglobin: 10.8 g/dL — ABNORMAL LOW (ref 13.0–17.0)
MCH: 32.3 pg (ref 26.0–34.0)
MCHC: 31.6 g/dL (ref 30.0–36.0)
MCV: 102.4 fL — ABNORMAL HIGH (ref 80.0–100.0)
Platelets: 58 10*3/uL — ABNORMAL LOW (ref 150–400)
RBC: 3.34 MIL/uL — ABNORMAL LOW (ref 4.22–5.81)
RDW: 18.8 % — ABNORMAL HIGH (ref 11.5–15.5)
WBC: 9.8 10*3/uL (ref 4.0–10.5)
nRBC: 0 % (ref 0.0–0.2)

## 2018-03-27 LAB — HEPATIC FUNCTION PANEL
ALBUMIN: 2.5 g/dL — AB (ref 3.5–5.0)
ALT: 27 U/L (ref 0–44)
AST: 51 U/L — ABNORMAL HIGH (ref 15–41)
Alkaline Phosphatase: 95 U/L (ref 38–126)
Bilirubin, Direct: 1.9 mg/dL — ABNORMAL HIGH (ref 0.0–0.2)
Indirect Bilirubin: 5 mg/dL — ABNORMAL HIGH (ref 0.3–0.9)
TOTAL PROTEIN: 5.6 g/dL — AB (ref 6.5–8.1)
Total Bilirubin: 6.9 mg/dL — ABNORMAL HIGH (ref 0.3–1.2)

## 2018-03-27 LAB — BASIC METABOLIC PANEL
ANION GAP: 7 (ref 5–15)
BUN: 12 mg/dL (ref 8–23)
CO2: 20 mmol/L — ABNORMAL LOW (ref 22–32)
Calcium: 7.4 mg/dL — ABNORMAL LOW (ref 8.9–10.3)
Chloride: 106 mmol/L (ref 98–111)
Creatinine, Ser: 1.16 mg/dL (ref 0.61–1.24)
GFR calc Af Amer: >60 mL/min (ref >60)
GFR calc non Af Amer: >60 mL/min (ref >60)
GLUCOSE: 116 mg/dL — AB (ref 70–99)
Potassium: 3.4 mmol/L — ABNORMAL LOW (ref 3.5–5.1)
Sodium: 133 mmol/L — ABNORMAL LOW (ref 135–145)

## 2018-03-27 LAB — BRAIN NATRIURETIC PEPTIDE: B Natriuretic Peptide: 276.6 pg/mL — ABNORMAL HIGH (ref 0.0–100.0)

## 2018-03-27 LAB — HIV ANTIBODY (ROUTINE TESTING W REFLEX): HIV Screen 4th Generation wRfx: NONREACTIVE

## 2018-03-27 MED ORDER — POTASSIUM CHLORIDE CRYS ER 20 MEQ PO TBCR
40.0000 meq | EXTENDED_RELEASE_TABLET | Freq: Once | ORAL | Status: AC
  Administered 2018-03-27: 40 meq via ORAL
  Filled 2018-03-27: qty 2

## 2018-03-27 MED ORDER — TORSEMIDE 20 MG PO TABS
40.0000 mg | ORAL_TABLET | Freq: Every day | ORAL | Status: DC
  Administered 2018-03-27 – 2018-03-30 (×4): 40 mg via ORAL
  Filled 2018-03-27 (×5): qty 2

## 2018-03-27 MED ORDER — METOPROLOL TARTRATE 25 MG PO TABS
12.5000 mg | ORAL_TABLET | Freq: Two times a day (BID) | ORAL | Status: DC
  Administered 2018-03-27 – 2018-03-31 (×9): 12.5 mg via ORAL
  Filled 2018-03-27 (×9): qty 1

## 2018-03-27 MED ORDER — SODIUM CHLORIDE 0.9 % IV BOLUS
250.0000 mL | Freq: Once | INTRAVENOUS | Status: AC
  Administered 2018-03-27: 250 mL via INTRAVENOUS

## 2018-03-27 NOTE — Progress Notes (Signed)
[facility]                                                                              Patient Demographics  William [name], is a 68 y.o. male, DOB - [birth year], ZTI:[id number]  Admit date - 03/26/2018   Admitting Physician Shannelle Alguire Krystal Eaton, MD  Outpatient Primary MD for the patient is McGowen, William [name] [place], MD  Outpatient specialists:   LOS - 1  days   Medical records reviewed and are as summarized below:    Chief Complaint  Patient presents with  . Shortness of Breath  . Fever       Brief summary   Patient is a 68 year old male with history of CAD, chronic diastolic CHF, Nash cirrhosis, hypertension, chronic iron deficiency anemia,  hyperlipidemia presented to ED with fever, productive coughing, shortness of breath for 3 days.  Patient went to his PCP due to progressive worsening of his symptoms, fever of 101 F with congestion, productive cough with greenish sputum.  Felt very weak otherwise no body aches, nauseous due to coughing.  Also reported mild dyspnea on exertion, loss of appetite and generalized weakness and fatigue. While at PCPs office, O2 sat levels were 85% on room air, patient was placed on 4 L O2, sats improved to 95 to 96%.  Patient was sent to ED.   Assessment & Plan    Principal Problem:   Acute respiratory failure with hypoxia (HCC) secondary to sepsis, clinical community-acquired pneumonia -Patient met sepsis criteria at the time of admission due to hypotension, hypoxia, lactic acidosis, leukocytosis, source likely due to pneumonia -Influenza panel negative, urine strep antigen negative, follow blood cultures, sputum cultures -DC IV fluids, tolerating diet -CT angiogram of the chest showed no PE, interstitial edema concerning for CHF, posterior left base pneumonia, opacity in the left upper lobe measuring 2.5x 2.2 cm, possibly second focus of pneumonia, exclude neoplasm -Continue IV antibiotics, albuterol nebs, O2 as tolerated -Discussed with  patient and his wife in detail, recommended repeat CT chest in 4 weeks for interval improvement in pneumonia and to exclude neoplasm in the left upper lobe -Feeling better today still on 3 L O2 via nasal cannula, sats 99%, not on home O2  Active Problems:   Essential hypertension -BP better, restart metoprolol and Demadex, holding spironolactone    Non-insulin treated type 2 diabetes mellitus (HCC) -Diet controlled, patient is not on any oral hypoglycemics outpatient     Chronic diastolic CHF (congestive heart failure) (HCC) -Discontinue IV fluids -Follow volume status closely, restarted Demadex at outpatient dose -Strict I's and O's and daily weights  Acute kidney injury, hyponatremia with lactic acidosis likely due to severe dehydration from sepsis, poor oral intake -Baseline creatinine 1.0, presented with creatinine of 1.3 -Creatinine now normalized to baseline, 1.1    Cirrhosis, nonalcoholic ([place]) -Patient has a history of William [name], had more of an increase in his chronically elevated bilirubin. -Per wife, patient has outpatient ultrasound ordered this week to follow-up with Dr. 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  Follow closely  Code Status: Full CODE STATUS DVT Prophylaxis: SCDs Family Communication: Discussed in detail with the patient, all imaging results, lab results explained to the patient and wife   Disposition Plan: Needs inpatient treatment, still hypoxic  Time Spent in minutes   35 minutes  Procedures:  CT angiogram of the chest  Consultants:   None  Antimicrobials:   Anti-infectives (From  admission, onward)   Start     Dose/Rate Route Frequency Ordered Stop   03/27/18 1400  cefTRIAXone (ROCEPHIN) 2 g in sodium chloride 0.9 % 100 mL IVPB     2 g 200 mL/hr over 30 Minutes Intravenous Every 24 hours 03/26/18 1946 04/02/18 1359   03/27/18 1400  azithromycin (ZITHROMAX) 500 mg in sodium chloride 0.9 % 250 mL IVPB     500 mg 250 mL/hr over 60 Minutes Intravenous Every 24 hours 03/26/18 1946 04/03/18 1359   03/26/18 1545  cefTRIAXone (ROCEPHIN) 2 g in sodium chloride 0.9 % 100 mL IVPB  Status:  Discontinued     2 g 200 mL/hr over 30 Minutes Intravenous Every 24 hours 03/26/18 1531 03/26/18 2007   03/26/18 1545  azithromycin (ZITHROMAX) 500 mg in sodium chloride 0.9 % 250 mL IVPB  Status:  Discontinued     500 mg 250 mL/hr over 60 Minutes Intravenous Every 24 hours 03/26/18 1531 03/26/18 2007          Medications  Scheduled Meds: . benzonatate  100 mg Oral TID  . fluticasone  2 spray Each Nare Daily  . guaiFENesin  1,200 mg Oral BID  . metoprolol tartrate  12.5 mg Oral BID  . pantoprazole  40 mg Oral BID  . torsemide  40 mg Oral Daily  . tranexamic acid  1,300 mg Oral TID   Continuous Infusions: . azithromycin    . cefTRIAXone (ROCEPHIN)  IV     PRN Meds:.albuterol, guaiFENesin-dextromethorphan, lactulose      Subjective:   William [name] was seen and examined today.  Feeling slightly better today, no fevers this morning.  Coughing or shortness of breath improving.  Still hypoxic.  Patient denies dizziness, abdominal pain, N/V/D/C, new weakness, numbess, tingling. No acute events overnight.    Objective:   Vitals:   03/27/18 0017 03/27/18 0516 03/27/18 0625 03/27/18 1005  BP: (!) 110/56 120/70  (!) 117/59  Pulse: 83 82  81  Resp: _0 Temp: 98.8 F (37.1 C) 98.3 F (36.8 C)    TempSrc: Oral Oral    SpO2: 93% 94%  99%  Weight:   108.1 kg   Height:        Intake/Output Summary (Last 24 hours) at 03/27/2018 1118 Last data filed at 03/27/2018  0600 Gross per 24 hour  Intake 918.8 ml  Output 1250 ml  Net -331.2 ml     Wt Readings from Last 3 Encounters:  03/27/18 108.1 kg  03/26/18 105.1 kg  01/26/18 103.4 kg     Exam  General: Alert and oriented x 3, NAD  Eyes:  HEENT:  Atraumatic, normocephalic  Cardiovascular: S1 S2 auscultated, Regular rate and rhythm.  Respiratory: Bibasilar crackles  Gastrointestinal: Soft, nontender, nondistended, + bowel sounds  Ext: no pedal edema bilaterally  Neuro: No new deficits  Musculoskeletal: No digital cyanosis, clubbing  Skin:  No rashes  Psych: Normal affect and demeanor, alert and oriented x3    Data Reviewed:  I have personally reviewed following labs and imaging studies  Micro Results Recent Results (from the past 240 hour(s))  Blood Culture (routine x 2)     Status: None (Preliminary result)   Collection Time: 03/26/18  3:49 PM  Result Value Ref Range Status   Specimen Description   Final    LEFT ANTECUBITAL Performed at [facility] [address], [place], [place] [address]    Special Requests   Final    BOTTLES DRAWN AEROBIC AND ANAEROBIC Blood Culture adequate volume Performed at [facility] [address], [place], [place] [address]    Culture  Setup Time PENDING  Incomplete   Culture   Final    NO GROWTH < 24 HOURS Performed at [facility], [place] [address] [place], [place] [address]    Report Status PENDING  Incomplete  Blood Culture (routine x 2)     Status: None (Preliminary result)   Collection Time: 03/26/18  3:49 PM  Result Value Ref Range Status   Specimen Description   Final    RIGHT ANTECUBITAL Performed at [facility] [address], [place], [place] [address]    Special Requests   Final    BOTTLES DRAWN AEROBIC AND ANAEROBIC Blood Culture adequate volume Performed at [facility] [address] [place], [place] [address]    Culture   Final     NO GROWTH < 24 HOURS Performed at [facility] [address], [place], [place] [address]    Report Status PENDING  Incomplete  Culture, sputum-assessment     Status: None   Collection Time: 03/26/18  7:47 PM  Result Value Ref Range Status   Specimen Description EXPECTORATED SPUTUM  Final   Special Requests NONE  Final   Sputum evaluation   Final    THIS SPECIMEN IS ACCEPTABLE FOR SPUTUM CULTURE Performed at [facility], [place] [address], [place], [place] [address]    Report Status 03/26/2018 FINAL  Final  Culture, respiratory     Status: None (Preliminary result)   Collection Time: 03/26/18  7:47 PM  Result Value Ref Range Status   Specimen Description   Final    EXPECTORATED SPUTUM Performed at [facility] [address] [place], [place] [address]    Special Requests   Final    NONE Reflexed from [phone] Performed at [facility] [address] [place], [place] [address]    Gram Stain   Final    RARE WBC PRESENT, PREDOMINANTLY PMN FEW GRAM NEGATIVE RODS FEW GRAM POSITIVE COCCI IN PAIRS IN CHAINS FEW GRAM POSITIVE RODS Performed at [facility], [place] [address] [place], [place] [address]    Culture PENDING  Incomplete   Report Status PENDING  Incomplete    Radiology Reports Dg Chest 2 View  Result Date: 03/26/2018 CLINICAL DATA:  Patient is from home and transferred from PCP office today via [facility]. According to EMS, patient is experiencing shortness of breath, fever, and productive cough since Friday. When at PCP office, patients oxygen level was 85% on room airFormer smoker - diabetic - hx heart surgery EXAM: CHEST - 2 VIEW COMPARISON:  07/28/2017 FINDINGS: There are stable changes from prior CABG surgery. The cardiac silhouette is normal in size and configuration. No mediastinal or hilar masses.  No convincing adenopathy. There are prominent bronchovascular and interstitial markings that  are stable from the prior exam. No evidence of pneumonia or pulmonary edema. No pleural effusion or pneumothorax. Skeletal structures are intact. IMPRESSION: 1. No acute cardiopulmonary disease. 2. Chronically prominent bronchovascular markings and interstitial thickening stable from the prior exam. Electronically Signed   By: Lajean Manes M.D.   On: 03/26/2018 15:13   Ct Angio Chest Pe W And/or Wo Contrast  Result Date: 03/26/2018 CLINICAL DATA:  Shortness of breath with fever and cough EXAM: CT ANGIOGRAPHY CHEST WITH CONTRAST TECHNIQUE: Multidetector CT imaging of the chest was performed using the standard protocol during bolus administration of intravenous contrast. Multiplanar CT image reconstructions and MIPs were obtained to evaluate the vascular anatomy. CONTRAST:  168m ISOVUE-370 IOPAMIDOL (ISOVUE-370) INJECTION 76% COMPARISON:  Chest radiograph March 26, 2018 FINDINGS: Cardiovascular: Contrast bolus is somewhat less than optimal. There is also a slight degree of motion artifact. Given these limitations, there is no pulmonary embolus appreciable. The ascending thoracic aorta measures 4.2 x 4.2 cm in diameter. No thoracic aortic dissection is evident. There are scattered foci of calcification in visualized great vessels. There is aortic atherosclerosis. Patient is status post coronary artery bypass grafting. There is native coronary artery calcification. There is no pericardial effusion or pericardial thickening evident. Mediastinum/Nodes: Visualized thyroid appears unremarkable. There are subcentimeter mediastinal lymph nodes. There is no frank adenopathy by size criteria. No esophageal lesions are evident. Lungs/Pleura: There is diffuse interstitial thickening with likely degree of interstitial pulmonary edema. There is airspace opacity in the posterior segment of the left lower lobe, concerning for focal pneumonia. There is atelectatic change in both lower lobes as well. There is an area of  apparent consolidation in the anterior segment of the left upper lobe measuring 2.5 x 2.2 cm, best seen on axial slice 59 series 10. On axial slice 33 series 10, there is an 8 x 8 mm nodular opacity in the anterior segment of the right upper lobe. There is a nodular opacity in the apical segment of the right upper lobe measuring 6 x 6 mm. There is an ill-defined opacity in the superior segment of the right lower lobe on axial slice 59 series 10 measuring 7 x 6 mm. There is no appreciable pleural effusion. Upper Abdomen: There is cholelithiasis. Liver contour is nodular consistent with hepatic cirrhosis. Spleen is incompletely visualized but appears enlarged. There is upper abdominal aortic atherosclerosis. Musculoskeletal: Patient is status post median sternotomy. There is extensive degenerative change in the thoracic spine with diffuse idiopathic skeletal hyperostosis. There are no blastic or lytic bone lesions. There are no chest wall lesions evident. Review of the MIP images confirms the above findings. IMPRESSION: 1. No pulmonary embolus evident. Note that contrast bolus is somewhat less than optimal, and there is a degree of patient motion, making assessment of the more peripheral pulmonary arteries less than optimal. 2. Ascending thoracic aorta measures 4.2 x 4.2 cm in transverse diameter. Recommend annual imaging followup by CTA or MRA. This recommendation follows 2010 ACCF/AHA/AATS/ACR/ASA/SCA/SCAI/SIR/STS/SVM Guidelines for the Diagnosis and Management of Patients with Thoracic Aortic Disease. Circulation. 2010; 121:: Q300-P233 Aortic aneurysm NOS (ICD10-I71.9). No thoracic aortic dissection evident. There is aortic atherosclerosis. There are foci of calcification in visualized great vessels. There are foci of native coronary artery calcification. Patient is status post coronary artery bypass grafting. 3. Interstitial edema raising concern for a degree of underlying congestive heart failure. Atelectasis in  both lower lobes noted. Consolidation in the  posterior left base is concerning for pneumonia. There may be alveolar edema in this area as well. 4. There is an opacity in the anterior segment of the left upper lobe peripherally measuring 2.5 x 2.2 cm. This area may represent a second focus of pneumonia. A neoplastic focus with adjacent atelectasis could present in this manner. Given this finding, a follow-up chest CT after treatment for pneumonia in 3-4 weeks is felt to be warranted to further evaluate this area. In addition, there are several subcentimeter nodular opacities on the right which warrant additional surveillance. Assuming that the larger lesion on the left clears after treatment for pneumonia, noncontrast chest CT at 3-6 months is recommended. If the nodules are stable at time of repeat CT, then future CT at 18-24 months (from today's scan) is considered optional for low-risk patients, but is recommended for high-risk patients. This recommendation follows the consensus statement: Guidelines for Management of Incidental Pulmonary Nodules Detected on CT Images: From the Fleischner Society 2017; Radiology 2017; 284:228-243. 5.  No appreciable adenopathy. 6. Evidence of hepatic cirrhosis. Incomplete visualization of enlarged spleen. 7.  Cholelithiasis. 8.  Diffuse idiopathic skeletal hyperostosis in the thoracic spine. Aortic Atherosclerosis (ICD10-I70.0). Electronically Signed   By: Lowella Grip III M.D.   On: 03/26/2018 18:28   US Abdomen Complete  Result Date: 03/27/2018 CLINICAL DATA:  Cirrhosis. EXAM: ABDOMEN ULTRASOUND COMPLETE COMPARISON:  GI bleeding study 01/27/2017.  CT 11/01/2016. FINDINGS: Gallbladder: Multiple gallstones. Gallbladder wall thickness 3.2 mm. This is slightly prominent. Cholecystitis could not be excluded. Negative Murphy sign. Common bile duct: Diameter: 1.4 mm Liver: Coarse nodular parenchymal pattern noted. This could be secondary to cirrhosis. A 1.5 cm simple cyst is  noted in the right lobe of the liver. Portal vein is patent on color Doppler imaging with normal direction of blood flow towards the liver. IVC: No abnormality visualized. Pancreas: Visualized portion unremarkable. Spleen: Spleen is prominent at 14.5 cm with a volume of 1220.3 cc Right Kidney: Length: 12.4 cm. Echogenicity within normal limits. 4.0 cm simple cyst no hydronephrosis visualized. Left Kidney: Length: 11.8 cm. Echogenicity within normal limits. No mass or hydronephrosis visualized. Abdominal aorta: No aneurysm visualized. Other findings: Mild ascites. IMPRESSION: 1. Multiple gallstones. Gallbladder wall slightly prominent at 3.2 mm. Cholecystitis can not be excluded. Negative Murphy sign. 2. Coarse nodular hepatic parenchymal pattern. This could be secondary to cirrhosis. Portal vein is patent with normal direction of flow. 1.5 cm simple cyst noted the right lobe of the liver. Splenomegaly. Mild ascites. 3.  4.0 cm simple cyst right kidney. Electronically Signed   By: Marcello Moores  Register   On: 03/27/2018 10:48    Lab Data:  CBC: Recent Labs  Lab 03/26/18 1423 03/27/18 0526  WBC 12.3* 9.8  NEUTROABS 9.5*  --   HGB 12.1* 10.8*  HCT 37.3* 34.2*  MCV 97.4 102.4*  PLT 70* 58*   Basic Metabolic Panel: Recent Labs  Lab 03/26/18 1423 03/27/18 0526  NA 131* 133*  K 3.6 3.4*  CL 98 106  CO2 23 20*  GLUCOSE 137* 116*  BUN 16 12  CREATININE 1.36* 1.16  CALCIUM 8.0* 7.4*   GFR: Estimated Creatinine Clearance: 73.9 mL/min (by C-G formula based on SCr of 1.16 mg/dL). Liver Function Tests: No results for input(s): AST, ALT, ALKPHOS, BILITOT, PROT, ALBUMIN in the last 168 hours. No results for input(s): LIPASE, AMYLASE in the last 168 hours. No results for input(s): AMMONIA in the last 168 hours. Coagulation Profile: No results for input(s): INR,  PROTIME in the last 168 hours. Cardiac Enzymes: No results for input(s): CKTOTAL, CKMB, CKMBINDEX, TROPONINI in the last 168 hours. BNP  (last 3 results) No results for input(s): PROBNP in the last 8760 hours. HbA1C: No results for input(s): HGBA1C in the last 72 hours. CBG: No results for input(s): GLUCAP in the last 168 hours. Lipid Profile: No results for input(s): CHOL, HDL, LDLCALC, TRIG, CHOLHDL, LDLDIRECT in the last 72 hours. Thyroid Function Tests: No results for input(s): TSH, T4TOTAL, FREET4, T3FREE, THYROIDAB in the last 72 hours. Anemia Panel: No results for input(s): VITAMINB12, FOLATE, FERRITIN, TIBC, IRON, RETICCTPCT in the last 72 hours. Urine analysis:    Component Value Date/Time   COLORURINE YELLOW 03/26/2018 1531   APPEARANCEUR CLEAR 03/26/2018 1531   LABSPEC 1.014 03/26/2018 1531   PHURINE 5.0 03/26/2018 1531   GLUCOSEU NEGATIVE 03/26/2018 1531   HGBUR NEGATIVE 03/26/2018 1531   BILIRUBINUR NEGATIVE 03/26/2018 1531   KETONESUR NEGATIVE 03/26/2018 1531   PROTEINUR NEGATIVE 03/26/2018 1531   NITRITE NEGATIVE 03/26/2018 William [name] 03/26/2018 1531     Anthoni Geerts M.D. [facility] 03/27/2018, 11:18 AM  Pager: [phone] Between 7am to 7pm - call Pager - [phone]  After 7pm go to [email or web address] - password [email or web address]  Call night coverage person covering after 7pm

## 2018-03-27 NOTE — Evaluation (Signed)
Physical Therapy Evaluation Patient Details Name: William [name] MRN: [id number] DOB: 16-Oct-1950 Today's Date: 03/27/2018   History of Present Illness  Patient is a 68 y/o male presenting to the ED from [place]CP [place] on 03/26/2018 due to SOB, fever, coughing. Past medical history of CAD, chronic diastolic CHF, Nash cirrhosis, hypertension, chronic iron deficiency anemia,  hyperlipidemia. Admitted for Acute respiratory failure with hypoxia secondary to sepsis, clinical community-acquired pneumonia    Clinical Impression  Patient admitted with the above listed diagnosis. Patient reports IND with mobility and ADLs prior to admission. Patient today up and ambulating in room on RA - SpO2 at 82% - PT reapplying supplemental O2 at 2L with good return to 90%. Use of 2L O2 via [place] for hallway ambulation with SpO2 drop to 81-82%. Increased Supplemental O2 to 4L via [place] with return to 91% and patient left in chair with all needs met on 3L via [place] with nursing notified. No LOB or overt instability with mobility - patient feels as if he is at baseline with mobility. No further acute PT needs identified. PT to sign off.     Follow Up Recommendations No PT follow up    Equipment Recommendations  None recommended by PT    Recommendations for Other Services       Precautions / Restrictions Precautions Precautions: Fall Restrictions Weight Bearing Restrictions: No      Mobility  Bed Mobility               General bed mobility comments: up in recliner  Transfers Overall transfer level: Needs assistance Equipment used: None Transfers: Sit to/from Stand Sit to Stand: Supervision         General transfer comment: for safety  Ambulation/Gait Ambulation/Gait assistance: Supervision Gait Distance (Feet): 200 Feet Assistive device: None Gait Pattern/deviations: Step-through pattern;Decreased stride length     General Gait Details: steady pace of gait - reduced speed compared to baseline per  patient; no LOB  Stairs            Wheelchair Mobility    Modified Rankin (Stroke Patients Only)       Balance Overall balance assessment: Mild deficits observed, not formally tested                                           Pertinent Vitals/Pain Pain Assessment: No/denies pain    Home Living Family/patient expects to be discharged to:: Private residence Living Arrangements: Spouse/significant other Available Help at Discharge: Family;Available PRN/intermittently Type of Home: House Home Access: Stairs to enter;Ramped entrance   Entrance Stairs-Number of Steps: 2 Home Layout: Two level;Able to live on main level with bedroom/bathroom Home Equipment: Gilford Rile - 2 wheels;Cane - single point;Bedside commode      Prior Function Level of Independence: Independent               Hand Dominance        Extremity/Trunk Assessment   Upper Extremity Assessment Upper Extremity Assessment: Overall WFL for tasks assessed    Lower Extremity Assessment Lower Extremity Assessment: Overall WFL for tasks assessed    Cervical / Trunk Assessment Cervical / Trunk Assessment: Normal  Communication   Communication: No difficulties  Cognition Arousal/Alertness: Awake/alert Behavior During Therapy: WFL for tasks assessed/performed Overall Cognitive Status: Within Functional Limits for tasks assessed  General Comments General comments (skin integrity, edema, etc.): wife present and supportive    Exercises     Assessment/Plan    PT Assessment Patent does not need any further PT services  PT Problem List         PT Treatment Interventions      PT Goals (Current goals can be found in the Care Plan section)  Acute Rehab PT Goals PT Goal Formulation: All assessment and education complete, DC therapy    Frequency     Barriers to discharge        Co-evaluation               AM-PAC  PT "6 Clicks" Mobility  Outcome Measure Help needed turning from your back to your side while in a flat bed without using bedrails?: None Help needed moving from lying on your back to sitting on the side of a flat bed without using bedrails?: None Help needed moving to and from a bed to a chair (including a wheelchair)?: A Little Help needed standing up from a chair using your arms (e.g., wheelchair or bedside chair)?: A Little Help needed to walk in hospital room?: A Little Help needed climbing 3-5 steps with a railing? : A Little 6 Click Score: 20    End of Session Equipment Utilized During Treatment: Gait belt;Oxygen Activity Tolerance: Patient tolerated treatment well Patient left: in chair;with call bell/phone within reach;with family/visitor present Nurse Communication: Mobility status PT Visit Diagnosis: Unsteadiness on feet (R26.81)    Time: 4481-8563 PT Time Calculation (min) (ACUTE ONLY): 28 min   Charges:   PT Evaluation $PT Eval Moderate Complexity: 1 Mod PT Treatments $Gait Training: 8-22 mins        Lanney Gins, PT, DPT [facility] Therapist 03/27/18 9:30 AM Pager: [phone] Office: [phone]

## 2018-03-28 DIAGNOSIS — J181 Lobar pneumonia, unspecified organism: Secondary | ICD-10-CM

## 2018-03-28 LAB — COMPREHENSIVE METABOLIC PANEL
ALT: 28 U/L (ref 0–44)
AST: 34 U/L (ref 15–41)
Albumin: 2.5 g/dL — ABNORMAL LOW (ref 3.5–5.0)
Alkaline Phosphatase: 90 U/L (ref 38–126)
Anion gap: 8 (ref 5–15)
BILIRUBIN TOTAL: 6.3 mg/dL — AB (ref 0.3–1.2)
BUN: 11 mg/dL (ref 8–23)
CO2: 21 mmol/L — ABNORMAL LOW (ref 22–32)
Calcium: 7.5 mg/dL — ABNORMAL LOW (ref 8.9–10.3)
Chloride: 104 mmol/L (ref 98–111)
Creatinine, Ser: 1.13 mg/dL (ref 0.61–1.24)
GFR calc Af Amer: >60 mL/min (ref >60)
GFR calc non Af Amer: >60 mL/min (ref >60)
Glucose, Bld: 126 mg/dL — ABNORMAL HIGH (ref 70–99)
POTASSIUM: 3.3 mmol/L — AB (ref 3.5–5.1)
Sodium: 133 mmol/L — ABNORMAL LOW (ref 135–145)
TOTAL PROTEIN: 5.5 g/dL — AB (ref 6.5–8.1)

## 2018-03-28 LAB — CBC
HCT: 33.3 % — ABNORMAL LOW (ref 39.0–52.0)
Hemoglobin: 10.6 g/dL — ABNORMAL LOW (ref 13.0–17.0)
MCH: 31.8 pg (ref 26.0–34.0)
MCHC: 31.8 g/dL (ref 30.0–36.0)
MCV: 100 fL (ref 80.0–100.0)
Platelets: 61 10*3/uL — ABNORMAL LOW (ref 150–400)
RBC: 3.33 MIL/uL — ABNORMAL LOW (ref 4.22–5.81)
RDW: 18.7 % — ABNORMAL HIGH (ref 11.5–15.5)
WBC: 9 10*3/uL (ref 4.0–10.5)
nRBC: 0 % (ref 0.0–0.2)

## 2018-03-28 MED ORDER — LACTULOSE 10 GM/15ML PO SOLN
20.0000 g | Freq: Two times a day (BID) | ORAL | Status: DC
  Administered 2018-03-28 – 2018-03-31 (×3): 20 g via ORAL
  Filled 2018-03-28 (×6): qty 30

## 2018-03-28 MED ORDER — SPIRONOLACTONE 12.5 MG HALF TABLET
12.5000 mg | ORAL_TABLET | Freq: Every day | ORAL | Status: DC
  Administered 2018-03-29 – 2018-03-31 (×3): 12.5 mg via ORAL
  Filled 2018-03-28 (×3): qty 1

## 2018-03-28 MED ORDER — POTASSIUM CHLORIDE CRYS ER 20 MEQ PO TBCR
20.0000 meq | EXTENDED_RELEASE_TABLET | Freq: Every day | ORAL | Status: DC
  Administered 2018-03-28 – 2018-03-31 (×4): 20 meq via ORAL
  Filled 2018-03-28 (×4): qty 1

## 2018-03-28 NOTE — Progress Notes (Signed)
PROGRESS NOTE  William [name] YOV:[id number] DOB: 12/01/1950 DOA: 03/26/2018 PCP: Tammi Sou, MD  Brief History   68 year old man PMH including cirrhosis, presented with fever, cough, shortness of breath, acute hypoxia, sent to the emergency department where hypoxia was confirmed and imaging suggested pneumonia  A & P  Sepsis with associated acute hypoxic respiratory failure, secondary to lobar pneumonia.  Influenza panel negative.  Urine strep antigen negative. --Remains hypoxic, was febrile last night, hemodynamic stable.  Continue empiric antibiotics culture data. --Given the appearance of the opacity in the left upper lobe, recommend follow-up chest CT after treatment for pneumonia in 3-4 weeks to further evaluate this area.  In addition several nodular opacities in the right warrant additional surveillance, Assuming that the larger lesion on the left clears after treatment for pneumonia, noncontrast chest CT at 3-6 months is recommended. If the nodules are stable at time of repeat CT, then future CT at 18-24 months (from today's scan) is considered optional for low-risk patients, but is recommended for high-risk patients.   Dehydration --Resolved.  Creatinine at baseline. 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 [name].  Asymptomatic cholelithiasis. --Continue lactulose  Diabetes mellitus type 2, diet-controlled --Stable.  Chronic diastolic CHF --Stable. Continue torsemide.  Ascending thoracic aorta measures 4.2 x 4.2 cm in transverse diameter. Recommend annual imaging followup by CTA or MRA.  Aortic atherosclerosis   Slow to improve, still hypoxic, continue empiric antibiotics.  DVT prophylaxis: SCDs Code Status: Full Family Communication: wife at bedside Disposition Plan: home    [name], MD  [facility] Direct contact: see [email or web address]  7PM-7AM contact night coverage as  above 03/28/2018, 7:53 PM  LOS: 2 days   Consultants  .   Procedures  .   Antibiotics  . Ceftriaxone 2/17 . Azithromycin 2/17  Interval History/Subjective  Had fever last night.  Still having significant cough but less productive.  Breathing okay while sitting but gets quite short of breath and fatigued with morning toilet. Wife at bedside, [employer] employee  Objective   Vitals:  Vitals:   03/28/18 1408 03/28/18 1605  BP: 121/63   Pulse: 73   Resp: 19   Temp: 99.6 F (37.6 C)   SpO2: 92% 91%    Exam:  Constitutional:  . Appears calm ill but nontoxic Eyes:  . pupils and irises appear normal ENMT:  . grossly normal hearing  Respiratory:  . Bilateral inspiratory crackles.  No rhonchi or wheezes. [name] Respiratory effort mild to moderate increase. Cardiovascular:  . RRR, no m/r/g . No LE extremity edema   Abdomen:  . Soft, nontender, nondistended Psychiatric:  . Mental status o Mood, affect appropriate . judgment and insight appear intact     I have personally reviewed the following:   Today's Data  . Potassium 3.3, remainder CMP unremarkable . Hemoglobin stable at 10.6, remainder CBC unremarkable  Lab Data  . Noted  Micro Data  . Influenza PCR negative . Blood cultures no growth to date . Sputum culture pending  Imaging  . Abdominal ultrasound showed multiple gallstones . CT chest no PE, ascending thoracic aorta measurements noted, annual imaging recommended  Cardiology Data  .   Other Data  .   Scheduled Meds: . benzonatate  100 mg Oral TID  . fluticasone  2 spray Each Nare Daily  . guaiFENesin  1,200 mg Oral BID  . lactulose  20 g Oral BID  . metoprolol tartrate  12.5 mg Oral BID  .  pantoprazole  40 mg Oral BID  . potassium chloride  20 mEq Oral Daily  . [START ON 03/29/2018] spironolactone  12.5 mg Oral Daily  . torsemide  40 mg Oral Daily  . tranexamic acid  1,300 mg Oral TID   Continuous Infusions: . azithromycin 500 mg (03/28/18 1326)   . cefTRIAXone (ROCEPHIN)  IV 200 mL/hr at 03/28/18 1500    Principal Problem:   Lobar pneumonia ([place]) Active Problems:   Essential hypertension   Non-insulin treated type 2 diabetes mellitus (HCC)   Chronic diastolic CHF (congestive heart failure) (HCC)   Cirrhosis, nonalcoholic (HCC)   Sepsis (HCC)   Acute respiratory failure with hypoxia ([place])   LOS: 2 days

## 2018-03-29 LAB — CULTURE, RESPIRATORY W GRAM STAIN

## 2018-03-29 LAB — CULTURE, RESPIRATORY: CULTURE: NORMAL

## 2018-03-29 NOTE — Progress Notes (Signed)
PROGRESS NOTE  William [name] WUJ:[id number] DOB: January 14, 1951 DOA: 03/26/2018 PCP: William Sou, MD  Brief History   68 year old man PMH including cirrhosis, presented with fever, cough, shortness of breath, acute hypoxia, sent to the emergency department where hypoxia was confirmed and imaging suggested pneumonia  A & P  Sepsis with associated acute hypoxic respiratory failure, secondary to lobar pneumonia.  Influenza panel negative.  Urine strep antigen negative. --Some improvement, afebrile but remains hypoxic.  Anticipate will need discharge home on oxygen. --Given the appearance of the opacity in the left upper lobe, recommend follow-up chest CT after treatment for pneumonia in 3-4 weeks to further evaluate this area.  In addition several nodular opacities in the right warrant additional surveillance, Assuming that the larger lesion on the left clears after treatment for pneumonia, noncontrast chest CT at 3-6 months is recommended. If the nodules are stable at time of repeat CT, then future CT at 18-24 months (from today's scan) is considered optional for low-risk patients, but is recommended for high-risk patients.   Dehydration --Resolved.  Creatinine at baseline. 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 Benson [name].  Asymptomatic cholelithiasis. --Continue lactulose  Diabetes mellitus type 2, diet-controlled --Stable.  Chronic diastolic CHF --Stable. Continue torsemide.  Ascending thoracic aorta measures 4.2 x 4.2 cm in transverse diameter. Recommend annual imaging followup by CTA or MRA.  Aortic atherosclerosis   Slowly improving.  Continue antibiotics.  Anticipate home tomorrow with oxygen.  DVT prophylaxis: SCDs Code Status: Full Family Communication: wife at bedside Disposition Plan: home    William Hodgkins, MD  [facility] Direct contact: see [email or web address]  7PM-7AM contact night coverage  as above 03/29/2018, 4:30 PM  LOS: 3 days   Consultants  .   Procedures  .   Antibiotics  . Ceftriaxone 2/17 . Azithromycin 2/17  Interval History/Subjective  No fever last night.  Overall feeling better but still gets winded especially with ambulation.  Objective   Vitals:  Vitals:   03/29/18 1109 03/29/18 1302  BP:  117/63  Pulse: 78 72  Resp:  20  Temp:  98.9 F (37.2 C)  SpO2: (!) 88% 90%    Exam:  Constitutional:   . Appears calm and comfortable Respiratory:  . Bilateral inspiratory crackles. [name] Respiratory effort mildly increased. Cardiovascular:  . RRR, no m/r/g . No LE extremity edema   Psychiatric:  . Mental status o Mood, affect appropriate  I have personally reviewed the following:   Today's Data  . Respiratory culture negative  Lab Data  . Noted  Micro Data  . Influenza PCR negative . Blood cultures no growth to date 2/20 . Sputum culture normal flora  Imaging  . Abdominal ultrasound showed multiple gallstones . CT chest no PE, ascending thoracic aorta measurements noted, annual imaging recommended  Cardiology Data  .   Other Data  .   Scheduled Meds: . benzonatate  100 mg Oral TID  . fluticasone  2 spray Each Nare Daily  . guaiFENesin  1,200 mg Oral BID  . lactulose  20 g Oral BID  . metoprolol tartrate  12.5 mg Oral BID  . pantoprazole  40 mg Oral BID  . potassium chloride  20 mEq Oral Daily  . spironolactone  12.5 mg Oral Daily  . torsemide  40 mg Oral Daily  . tranexamic acid  1,300 mg Oral TID   Continuous Infusions: . azithromycin 500 mg (03/29/18 1524)  . cefTRIAXone (ROCEPHIN)  IV 2 g (  03/29/18 1434)    Principal Problem:   Lobar pneumonia ([place]) Active Problems:   Essential hypertension   Non-insulin treated type 2 diabetes mellitus (HCC)   Chronic diastolic CHF (congestive heart failure) (HCC)   Cirrhosis, nonalcoholic (HCC)   Sepsis ([place])   Acute respiratory failure with hypoxia ([place])   LOS: 3 days

## 2018-03-29 NOTE — Care Management Important Message (Signed)
Important Message  Patient Details  Name: DERIN GRANQUIST MRN: [id number] Date of Birth: 10-Jan-1951   Medicare Important Message Given:  Yes    Kerin Salen 03/29/2018, 1:15 PMImportant Message  Patient Details  Name: MATTHEO SWINDLE MRN: [id number] Date of Birth: 11-22-1950   Medicare Important Message Given:  Yes    Kerin Salen 03/29/2018, 1:15 PM

## 2018-03-29 NOTE — Progress Notes (Signed)
SATURATION QUALIFICATIONS: (This note is used to comply with regulatory documentation for home oxygen)  Patient Saturations on Room Air at Rest = 84-86 %  Patient Saturations on Room Air while Ambulating =  %  Patient Saturations on  Liters of oxygen while Ambulating = %  Please briefly explain why patient needs home oxygen: Oxygen at 2 liters was removed 5 mins prior to assessment. Pt was sitting in the chair at rest. Oxygen saturation was 84 % and after encouraging deep breathing saturation increased slightly to 86% on room air. 2 liters applied with increase 88%. Pt is now on 3 lt sat 92-94%. Will continue to monitor

## 2018-03-29 NOTE — Care Management Note (Signed)
Case Management Note  Patient Details  Name: William [name] MRN: [id number] Date of Birth: [birth year]  Subjective/Objective: Noted Chronic CHF. From home. PT-no f/u. If home 02 needed can arrange with dx, 02 sats documented qualifying, & home 02 order.[employer] rep Brad following of home 02 needed.                   Action/Plan:d/c plan home.   Expected Discharge Date:  (unknown)               Expected Discharge Plan:  Home/Self Care  In-House Referral:     Discharge planning Services  CM Consult  Post Acute Care Choice:    Choice offered to:     DME Arranged:    DME Agency:     HH Arranged:    HH Agency:     Status of Service:  In process, will continue to follow  If discussed at Long Length of Stay Meetings, dates discussed:    Additional Comments:  Dessa Phi, RN 03/29/2018, 11:01 AM

## 2018-03-30 ENCOUNTER — Other Ambulatory Visit: Payer: Commercial Managed Care - PPO

## 2018-03-30 MED ORDER — FUROSEMIDE 10 MG/ML IJ SOLN
40.0000 mg | Freq: Once | INTRAMUSCULAR | Status: AC
  Administered 2018-03-30: 40 mg via INTRAVENOUS
  Filled 2018-03-30: qty 4

## 2018-03-30 NOTE — Progress Notes (Signed)
PROGRESS NOTE  William [name] [id number]:[id number] DOB: May 13, 1950 DOA: 03/26/2018 PCP: Tammi Sou, MD  Brief History   68 year old man PMH including cirrhosis, presented with fever, cough, shortness of breath, acute hypoxia, sent to the emergency department where hypoxia was confirmed and imaging suggested pneumonia  A & P  Sepsis with associated acute hypoxic respiratory failure, secondary to lobar pneumonia.  Influenza panel negative.  Urine strep antigen negative. --Slow to improve, still has significant oxygen requirement on 3 L, remains short of breath at rest --Given the appearance of the opacity in the left upper lobe, recommend follow-up chest CT after treatment for pneumonia in 3-4 weeks to further evaluate this area.  In addition several nodular opacities in the right warrant additional surveillance, Assuming that the larger lesion on the left clears after treatment for pneumonia, noncontrast chest CT at 3-6 months is recommended. If the nodules are stable at time of repeat CT, then future CT at 18-24 months (from today's scan) is considered optional for low-risk patients, but is recommended for high-risk patients.   Dehydration --Resolved.  Creatinine at baseline. 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 [name].  Asymptomatic cholelithiasis. --Stable.  Continue lactulose  Diabetes mellitus type 2, diet-controlled --Stable.  Chronic diastolic CHF --Some increased lower extremity edema.  I/O balanced but may not be accurate.  Continue torsemide.  One-time dose Lasix now.  Ascending thoracic aorta measures 4.2 x 4.2 cm in transverse diameter. Recommend annual imaging followup by CTA or MRA.  Aortic atherosclerosis   Slow to improve but appears to be stabilizing.  Continue antibiotics.  Anticipate discharge home 2/22 on home oxygen if continues to improve.  DVT prophylaxis: SCDs Code Status: Full Family  Communication: Wife and son at bedside Disposition Plan: home    [name], MD  [facility] Direct contact: see [email or web address]  7PM-7AM contact night coverage as above 03/30/2018, 2:28 PM  LOS: 4 days   Consultants  .   Procedures  .   Antibiotics  . Ceftriaxone 2/17 . Azithromycin 2/17  Interval History/Subjective  Feels about the same today, still gets winded when getting up to the bathroom.  Still short of breath.  Wife notes increased lower extremity edema.  Objective   Vitals:  Vitals:   03/30/18 0732 03/30/18 1334  BP: 117/67 113/68  Pulse: 80 68  Resp: 18 19  Temp: 99.1 F (37.3 C) 98.9 F (37.2 C)  SpO2: (!) 89% 92%    Exam:  Constitutional:   . Appears calm and comfortable Eyes:  . pupils and irises appear normal ENMT:  . grossly normal hearing  Respiratory:  . Bilateral inspiratory crackles, no wheezes, rhonchi. [name] Respiratory effort mild to moderately increased.  Mildly short of breath at rest. Cardiovascular:  . RRR, no m/r/g . 1-2+ bilateral LE extremity edema   Psychiatric:  . Mental status o Mood, affect appropriate . judgment and insight appear intact    I have personally reviewed the following:   Today's Data  . No new data  Lab Data  . Noted  Micro Data  . Influenza PCR negative . Blood cultures no growth to date 2/21 . Sputum culture normal flora  Imaging  . Abdominal ultrasound showed multiple gallstones . CT chest no PE, ascending thoracic aorta measurements noted, annual imaging recommended  Cardiology Data  .   Other Data  .   Scheduled Meds: . benzonatate  100 mg Oral TID  . fluticasone  2 spray Each  Nare Daily  . furosemide  40 mg Intravenous Once  . guaiFENesin  1,200 mg Oral BID  . lactulose  20 g Oral BID  . metoprolol tartrate  12.5 mg Oral BID  . pantoprazole  40 mg Oral BID  . potassium chloride  20 mEq Oral Daily  . spironolactone  12.5 mg Oral Daily  . torsemide  40 mg Oral Daily  .  tranexamic acid  1,300 mg Oral TID   Continuous Infusions: . azithromycin 500 mg (03/29/18 1524)  . cefTRIAXone (ROCEPHIN)  IV 2 g (03/29/18 1434)    Principal Problem:   Lobar pneumonia ([place]) Active Problems:   Essential hypertension   Non-insulin treated type 2 diabetes mellitus (HCC)   Chronic diastolic CHF (congestive heart failure) (HCC)   Cirrhosis, nonalcoholic (HCC)   Sepsis (HCC)   Acute respiratory failure with hypoxia ([place])   LOS: 4 days

## 2018-03-30 NOTE — Plan of Care (Signed)
Plan of care reviewed and discussed with the patient. 

## 2018-03-31 LAB — CULTURE, BLOOD (ROUTINE X 2)
Culture: NO GROWTH
Culture: NO GROWTH
Special Requests: ADEQUATE
Special Requests: ADEQUATE

## 2018-03-31 MED ORDER — CEFDINIR 300 MG PO CAPS: 300.0000 mg | ORAL_CAPSULE | Freq: Two times a day (BID) | ORAL | 0 refills | Status: DC

## 2018-03-31 MED ORDER — FUROSEMIDE 10 MG/ML IJ SOLN
40.0000 mg | Freq: Once | INTRAMUSCULAR | Status: AC
  Administered 2018-03-31: 40 mg via INTRAVENOUS
  Filled 2018-03-31: qty 4

## 2018-03-31 MED ORDER — LACTULOSE 10 GM/15ML PO SOLN: 30.0000 g | Freq: Two times a day (BID) | ORAL | Status: DC | PRN

## 2018-03-31 MED ORDER — TORSEMIDE 20 MG PO TABS: 40.0000 mg | ORAL_TABLET | Freq: Every day | ORAL | Status: DC

## 2018-03-31 NOTE — Discharge Summary (Signed)
Physician Discharge Summary  William [name] VQM:[id number] DOB: Jul 04, 1950 DOA: 03/26/2018  PCP: Tammi Sou, MD  Admit date: 03/26/2018 Discharge date: 03/31/2018  Recommendations for Outpatient Follow-up:   1. Resolution of pneumonia, acute hypoxic respiratory failure.  Discharged on home oxygen at 3 L, can likely wean off in the next few weeks as patient recovers.  2. Given the appearance of the opacity in the left upper lobe, recommend follow-up chest CT after treatment for pneumonia in 3-4 weeks to further evaluate this area.  In addition several nodular opacities in the right warrant additional surveillance, Assuming that the larger lesion on the left clears after treatment for pneumonia, noncontrast chest CT at 3-6 months is recommended. If the nodules are stable at time of repeat CT, then future CT at 18-24 months (from today's scan) is considered optional for low-risk patients, but is recommended for high-risk patients.   3. Ascending thoracic aorta measures 4.2 x 4.2 cm in transverse diameter. Recommend annual imaging followup by CTA or MRA.   Follow-up Information    McGowen, Adrian Blackwater, MD. Schedule an appointment as soon as possible for a visit in 1 week(s).   Specialty:  Family Medicine Contact information: [id number] [place] [address] [phone]            Discharge Diagnoses: Principal diagnosis is #1 1. Sepsis with associated acute hypoxic respiratory failure, secondary to lobar pneumonia 2. 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 Diabetes mellitus type 2, diet-controlled 4. Chronic diastolic CHF 5. Aortic atherosclerosis   Discharge Condition: improved Disposition: home   Diet recommendation: heart healthy, diabetic diet  Filed Weights   03/27/18 0625 03/28/18 0440 03/29/18 0713  Weight: 108.1 kg 108.5 kg 106.8 kg    History of present illness:  68 year old man PMH including cirrhosis, presented  with fever, cough, shortness of breath, acute hypoxia, sent to the emergency department where hypoxia was confirmed and imaging suggested pneumonia  Hospital Course:  Patient was treated with empiric antibiotics with gradual clinical improvement, however at the time of discharge hypoxia persisted requiring discharge on home oxygen.  Expect hypoxia to resolve as the patient recovers from pneumonia.  Evaluated by PT, no need for PT follow-up.  Individual issues as below.  Discussed recommendation for outpatient imaging and follow-up and treatment plan with patient and wife at bedside.  Sepsis with associated acute hypoxic respiratory failure, secondary to lobar pneumonia.  Influenza panel negative.  Urine strep antigen negative. --Slowly improved, remains afebrile.  Continues to have 3 L oxygen requirement. --Given the appearance of the opacity in the left upper lobe, recommend follow-up chest CT after treatment for pneumonia in 3-4 weeks to further evaluate this area.  In addition several nodular opacities in the right warrant additional surveillance, Assuming that the larger lesion on the left clears after treatment for pneumonia, noncontrast chest CT at 3-6 months is recommended. If the nodules are stable at time of repeat CT, then future CT at 18-24 months (from today's scan) is considered optional for low-risk patients, but is recommended for high-risk patients.   Dehydration --Resolved.  Creatinine at baseline. 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 [name].  Asymptomatic cholelithiasis. --Remains a stable.  Continue lactulose  Diabetes mellitus type 2, diet-controlled --No acute issues noted  Chronic diastolic CHF --Overall appears stable with minimal ankle edema, lung exam improved.  Appears stable for discharge.  Ascending thoracic aorta measures 4.2 x 4.2 cm in transverse  diameter. Recommend annual imaging  followup by CTA or MRA.  Aortic atherosclerosis  Antibiotics   Ceftriaxone 2/17 > 2/21  Cefdinir 2/22 > 2/24  Azithromycin 2/17 >2/21  Today's assessment: S: Feels better today, breathing better today, lower extremity edema has decreased.  Still gets short of breath with ambulation but better. O: Vitals:  Vitals:   03/30/18 2200 03/31/18 0516  BP:  108/62  Pulse:  74  Resp:  16  Temp:  97.8 F (36.6 C)  SpO2: 91% (!) 88%    Constitutional:  . Appears calm and comfortable ENMT:  . grossly normal hearing  Respiratory:  . CTA anteriorly without wheezes, rales or rhonchi.  Decreased rales at the bases posteriorly. [name] Respiratory effort appears grossly normal Cardiovascular:  . RRR, no m/r/g . Trace bilateral ankle edema  Psychiatric:  . judgement and insight appear normal . Mental status o Mood, affect appropriate   Discharge Instructions  Discharge Instructions    Diet - low sodium heart healthy   Complete by:  As directed    Discharge instructions   Complete by:  As directed    Call your physician or seek immediate medical attention for fever, shortness of breath, swelling or worsening of condition.   Increase activity slowly   Complete by:  As directed      Allergies as of 03/31/2018      Reactions   Statins Other (See Comments)   Liver enzymes go up whenever on them      Medication List    TAKE these medications   cefdinir 300 MG capsule Commonly known as:  OMNICEF Take 1 capsule (300 mg total) by mouth 2 (two) times daily.   fluticasone 50 MCG/ACT nasal spray Commonly known as:  FLONASE Place 2 sprays into both nostrils daily. What changed:    when to take this  reasons to take this   lactulose 10 GM/15ML solution Commonly known as:  CHRONULAC Take 45 mLs (30 g total) by mouth 2 (two) times daily as needed for mild constipation. Take this until you have three BM's a day.   metoprolol tartrate 25 MG tablet Commonly known as:   LOPRESSOR TAKE 1/2 TABLET BY MOUTH TWICE A DAY   ondansetron 4 MG tablet Commonly known as:  ZOFRAN Take 1 tablet (4 mg total) by mouth every 8 (eight) hours as needed for nausea or vomiting.   pantoprazole 40 MG tablet Commonly known as:  PROTONIX TAKE 1 TABLET BY MOUTH TWICE DAILY BEFORE A MEAL What changed:  See the new instructions.   potassium chloride SA 20 MEQ tablet Commonly known as:  K-DUR,KLOR-CON TAKE 1 TABLET (20 MEQ TOTAL) BY MOUTH TWICE A DAY What changed:  See the new instructions.   spironolactone 25 MG tablet Commonly known as:  ALDACTONE Take 12.5 mg by mouth daily.   torsemide 20 MG tablet Commonly known as:  DEMADEX TAKE 2 TABLETS BY MOUTH DAILY.   tranexamic acid 650 MG Tabs tablet Commonly known as:  LYSTEDA TAKE 2 TABLETS BY MOUTH 3 TIMES DAILY. What changed:  See the new instructions.            Durable Medical Equipment  (From admission, onward)         Start     Ordered   03/29/18 1629  For home use only DME oxygen  Once    Question Answer Comment  Mode or (Route) Nasal cannula   Liters per Minute 3   Frequency Continuous (stationary and  portable oxygen unit needed)   Oxygen delivery system Gas      03/29/18 1628         Allergies  Allergen Reactions  . Statins Other (See Comments)    Liver enzymes go up whenever on them    The results of significant diagnostics from this hospitalization (including imaging, microbiology, ancillary and laboratory) are listed below for reference.    Significant Diagnostic Studies: Dg Chest 2 View  Result Date: 03/26/2018 CLINICAL DATA:  Patient is from home and transferred from PCP office today via [facility]. According to EMS, patient is experiencing shortness of breath, fever, and productive cough since Friday. When at PCP office, patients oxygen level was 85% on room airFormer smoker - diabetic - hx heart surgery EXAM: CHEST - 2 VIEW COMPARISON:  07/28/2017 FINDINGS: There are stable  changes from prior CABG surgery. The cardiac silhouette is normal in size and configuration. No mediastinal or hilar masses. No convincing adenopathy. There are prominent bronchovascular and interstitial markings that are stable from the prior exam. No evidence of pneumonia or pulmonary edema. No pleural effusion or pneumothorax. Skeletal structures are intact. IMPRESSION: 1. No acute cardiopulmonary disease. 2. Chronically prominent bronchovascular markings and interstitial thickening stable from the prior exam. Electronically Signed   By: Lajean Manes M.D.   On: 03/26/2018 15:13   Ct Angio Chest Pe W And/or Wo Contrast  Result Date: 03/26/2018 CLINICAL DATA:  Shortness of breath with fever and cough EXAM: CT ANGIOGRAPHY CHEST WITH CONTRAST TECHNIQUE: Multidetector CT imaging of the chest was performed using the standard protocol during bolus administration of intravenous contrast. Multiplanar CT image reconstructions and MIPs were obtained to evaluate the vascular anatomy. CONTRAST:  185m ISOVUE-370 IOPAMIDOL (ISOVUE-370) INJECTION 76% COMPARISON:  Chest radiograph March 26, 2018 FINDINGS: Cardiovascular: Contrast bolus is somewhat less than optimal. There is also a slight degree of motion artifact. Given these limitations, there is no pulmonary embolus appreciable. The ascending thoracic aorta measures 4.2 x 4.2 cm in diameter. No thoracic aortic dissection is evident. There are scattered foci of calcification in visualized great vessels. There is aortic atherosclerosis. Patient is status post coronary artery bypass grafting. There is native coronary artery calcification. There is no pericardial effusion or pericardial thickening evident. Mediastinum/Nodes: Visualized thyroid appears unremarkable. There are subcentimeter mediastinal lymph nodes. There is no frank adenopathy by size criteria. No esophageal lesions are evident. Lungs/Pleura: There is diffuse interstitial thickening with likely degree of  interstitial pulmonary edema. There is airspace opacity in the posterior segment of the left lower lobe, concerning for focal pneumonia. There is atelectatic change in both lower lobes as well. There is an area of apparent consolidation in the anterior segment of the left upper lobe measuring 2.5 x 2.2 cm, best seen on axial slice 59 series 10. On axial slice 33 series 10, there is an 8 x 8 mm nodular opacity in the anterior segment of the right upper lobe. There is a nodular opacity in the apical segment of the right upper lobe measuring 6 x 6 mm. There is an ill-defined opacity in the superior segment of the right lower lobe on axial slice 59 series 10 measuring 7 x 6 mm. There is no appreciable pleural effusion. Upper Abdomen: There is cholelithiasis. Liver contour is nodular consistent with hepatic cirrhosis. Spleen is incompletely visualized but appears enlarged. There is upper abdominal aortic atherosclerosis. Musculoskeletal: Patient is status post median sternotomy. There is extensive degenerative change in the thoracic spine with diffuse  idiopathic skeletal hyperostosis. There are no blastic or lytic bone lesions. There are no chest wall lesions evident. Review of the MIP images confirms the above findings. IMPRESSION: 1. No pulmonary embolus evident. Note that contrast bolus is somewhat less than optimal, and there is a degree of patient motion, making assessment of the more peripheral pulmonary arteries less than optimal. 2. Ascending thoracic aorta measures 4.2 x 4.2 cm in transverse diameter. Recommend annual imaging followup by CTA or MRA. This recommendation follows 2010 ACCF/AHA/AATS/ACR/ASA/SCA/SCAI/SIR/STS/SVM Guidelines for the Diagnosis and Management of Patients with Thoracic Aortic Disease. Circulation. 2010; 121: G644-I347. Aortic aneurysm NOS (ICD10-I71.9). No thoracic aortic dissection evident. There is aortic atherosclerosis. There are foci of calcification in visualized great vessels.  There are foci of native coronary artery calcification. Patient is status post coronary artery bypass grafting. 3. Interstitial edema raising concern for a degree of underlying congestive heart failure. Atelectasis in both lower lobes noted. Consolidation in the posterior left base is concerning for pneumonia. There may be alveolar edema in this area as well. 4. There is an opacity in the anterior segment of the left upper lobe peripherally measuring 2.5 x 2.2 cm. This area may represent a second focus of pneumonia. A neoplastic focus with adjacent atelectasis could present in this manner. Given this finding, a follow-up chest CT after treatment for pneumonia in 3-4 weeks is felt to be warranted to further evaluate this area. In addition, there are several subcentimeter nodular opacities on the right which warrant additional surveillance. Assuming that the larger lesion on the left clears after treatment for pneumonia, noncontrast chest CT at 3-6 months is recommended. If the nodules are stable at time of repeat CT, then future CT at 18-24 months (from today's scan) is considered optional for low-risk patients, but is recommended for high-risk patients. This recommendation follows the consensus statement: Guidelines for Management of Incidental Pulmonary Nodules Detected on CT Images: From the Fleischner Society 2017; Radiology 2017; 284:228-243. 5.  No appreciable adenopathy. 6. Evidence of hepatic cirrhosis. Incomplete visualization of enlarged spleen. 7.  Cholelithiasis. 8.  Diffuse idiopathic skeletal hyperostosis in the thoracic spine. Aortic Atherosclerosis (ICD10-I70.0). Electronically Signed   By: Lowella Grip III M.D.   On: 03/26/2018 18:28   US Abdomen Complete  Result Date: 03/27/2018 CLINICAL DATA:  Cirrhosis. EXAM: ABDOMEN ULTRASOUND COMPLETE COMPARISON:  GI bleeding study 01/27/2017.  CT 11/01/2016. FINDINGS: Gallbladder: Multiple gallstones. Gallbladder wall thickness 3.2 mm. This is slightly  prominent. Cholecystitis could not be excluded. Negative Murphy sign. Common bile duct: Diameter: 1.4 mm Liver: Coarse nodular parenchymal pattern noted. This could be secondary to cirrhosis. A 1.5 cm simple cyst is noted in the right lobe of the liver. Portal vein is patent on color Doppler imaging with normal direction of blood flow towards the liver. IVC: No abnormality visualized. Pancreas: Visualized portion unremarkable. Spleen: Spleen is prominent at 14.5 cm with a volume of 1220.3 cc Right Kidney: Length: 12.4 cm. Echogenicity within normal limits. 4.0 cm simple cyst no hydronephrosis visualized. Left Kidney: Length: 11.8 cm. Echogenicity within normal limits. No mass or hydronephrosis visualized. Abdominal aorta: No aneurysm visualized. Other findings: Mild ascites. IMPRESSION: 1. Multiple gallstones. Gallbladder wall slightly prominent at 3.2 mm. Cholecystitis can not be excluded. Negative Murphy sign. 2. Coarse nodular hepatic parenchymal pattern. This could be secondary to cirrhosis. Portal vein is patent with normal direction of flow. 1.5 cm simple cyst noted the right lobe of the liver. Splenomegaly. Mild ascites. 3.  4.0 cm simple cyst right  kidney. Electronically Signed   By: Marcello Moores  Register   On: 03/27/2018 10:48    Microbiology: Recent Results (from the past 240 hour(s))  Blood Culture (routine x 2)     Status: None (Preliminary result)   Collection Time: 03/26/18  3:49 PM  Result Value Ref Range Status   Specimen Description LEFT ANTECUBITAL  Final   Special Requests   Final    BOTTLES DRAWN AEROBIC AND ANAEROBIC Blood Culture adequate volume Performed at [facility] [address] [place], [place] [address]    Culture NO GROWTH 4 DAYS  Final   Report Status PENDING  Incomplete  Blood Culture (routine x 2)     Status: None (Preliminary result)   Collection Time: 03/26/18  3:49 PM  Result Value Ref Range Status   Specimen Description RIGHT ANTECUBITAL  Final    Special Requests   Final    BOTTLES DRAWN AEROBIC AND ANAEROBIC Blood Culture adequate volume Performed at [facility] [address], [place], [place] [address]    Culture NO GROWTH 4 DAYS  Final   Report Status PENDING  Incomplete  Culture, sputum-assessment     Status: None   Collection Time: 03/26/18  7:47 PM  Result Value Ref Range Status   Specimen Description EXPECTORATED SPUTUM  Final   Special Requests NONE  Final   Sputum evaluation   Final    THIS SPECIMEN IS ACCEPTABLE FOR SPUTUM CULTURE Performed at [facility], [place] [address], [place], [place] [address]    Report Status 03/26/2018 FINAL  Final  Culture, respiratory     Status: None   Collection Time: 03/26/18  7:47 PM  Result Value Ref Range Status   Specimen Description   Final    EXPECTORATED SPUTUM Performed at [facility] [address], [place], [place] [address]    Special Requests   Final    NONE Reflexed from [id number] Performed at [facility] [address] [place], [place] [address]    Gram Stain   Final    RARE WBC PRESENT, PREDOMINANTLY PMN FEW GRAM NEGATIVE RODS FEW GRAM POSITIVE COCCI IN PAIRS IN CHAINS FEW GRAM POSITIVE RODS    Culture   Final    FEW Consistent with normal respiratory flora. Performed at [facility], [place] [address] [place], [place] [address]    Report Status 03/29/2018 FINAL  Final     Labs: Basic Metabolic Panel: Recent Labs  Lab 03/26/18 1423 03/27/18 0526 03/28/18 0539  NA 131* 133* 133*  K 3.6 3.4* 3.3*  CL 98 106 104  CO2 23 20* 21*  GLUCOSE 137* 116* 126*  BUN 16 12 11   CREATININE 1.36* 1.16 1.13  CALCIUM 8.0* 7.4* 7.5*   Liver Function Tests: Recent Labs  Lab 03/27/18 1504 03/28/18 0539  AST 51* 34  ALT 27 28  ALKPHOS 95 90  BILITOT 6.9* 6.3*  PROT 5.6* 5.5*  ALBUMIN 2.5* 2.5*   CBC: Recent Labs  Lab 03/26/18 1423 03/27/18 0526 03/28/18 0539  WBC  12.3* 9.8 9.0  NEUTROABS 9.5*  --   --   HGB 12.1* 10.8* 10.6*  HCT 37.3* 34.2* 33.3*  MCV 97.4 102.4* 100.0  PLT 70* 58* 61*    Recent Labs    03/27/18 0526  BNP 276.6*     Principal Problem:   Lobar pneumonia (HCC) Active Problems:   Essential hypertension   Non-insulin treated type 2 diabetes mellitus (  [place])   Chronic diastolic CHF (congestive heart failure) (HCC)   Cirrhosis, nonalcoholic ([place])   Sepsis ([place])   Acute respiratory failure with hypoxia ([place])   Time coordinating discharge: 35 minutes  Signed:  [name], MD  [facility]  03/31/2018, 1:12 PM

## 2018-03-31 NOTE — Progress Notes (Signed)
SATURATION QUALIFICATIONS: (This note is used to comply with regulatory documentation for home oxygen)  Patient Saturations on Room Air at Rest = 85%  Patient Saturations on Room Air while Ambulating = 79%  Patient Saturations on 2 Liters of oxygen while Ambulating = 89%  Please briefly explain why patient needs home oxygen:

## 2018-03-31 NOTE — Care Management Note (Signed)
Case Management Note  Patient Details  Name: William [name] MRN: [id number] Date of Birth: [birth year]  Subjective/Objective:    Pt admitted with PNA, acute resp failure                Action/Plan: NCM ordered oxygen for home with [facility]. Wife states she will call his PCP and arrange a follow up appt.   Expected Discharge Date:  03/31/18               Expected Discharge Plan:  Home/Self Care  In-House Referral:  NA  Discharge planning Services  CM Consult  Post Acute Care Choice:  NA Choice offered to:  NA  DME Arranged:  Oxygen DME Agency:  [place]:  NA [place] Agency:  NA  Status of Service:  Completed, signed off  If discussed at [place] of Stay Meetings, dates discussed:    Additional Comments:  Erenest Rasher, RN 03/31/2018, 1:35 PM

## 2018-04-02 ENCOUNTER — Telehealth: Payer: Self-pay

## 2018-04-02 ENCOUNTER — Other Ambulatory Visit: Payer: Self-pay | Admitting: *Deleted

## 2018-04-02 NOTE — Patient Outreach (Signed)
[facility]) Care Management  04/02/2018  William [name] November 25, 1950 [id number]   Transition of care telephone call  Referral received: 03/27/18 Initial outreach: 04/02/18  Insurance: [employer]  Initial unsuccessful telephone call to patient's preferred number (home) in order to complete transition of care assessment; no answer, left HIPAA compliant voicemail message requesting return call.   Objective: Per the electronic medical record, [name] was hospitalized at [facility] from 2/17-2/22/20 for acute reparatory failure, sepsis, fever,  related to community acquired pneumonia.  Comorbidities include: chronic diastolic heart failure, chronic GI bleed, nonalcoholic cirrhosis, Type 2 DM- on no DM medications, chronic renal failure,  chronic anemia, CAD with hx of CABG 0/1040, metabolic syndrome, mixed hyperlipidemia, thrombocytopenia, iron deficiency anemia due to chronic blood loss. He was discharged to home on 03/31/18 without the need for home health services but with home O2 at 3 l/min with O2 supplied by [facility].  Plan: This RNCM will route unsuccessful outreach letter with [place] Management pamphlet and 24 hour Nurse Advice Line Magnet to [facility] Management clinical pool to be mailed to patient's home address. This RNCM will attempt another outreach within 4 business days.  [name] RN,CCM,CDE [place] Management Coordinator Office Phone [phone] Office Fax [phone]

## 2018-04-02 NOTE — Telephone Encounter (Signed)
Spoke with patients wife, [name].  Transition Care Management Follow-up Telephone Call  Admit date: 03/26/2018 Discharge date: 03/31/2018 Dx: Sepsis with associated acute hypoxic respiratory failure, secondary to lobar pneumonia   How have you been since you were released from the hospital? "He's doing a little better" Wife states pt continues to have edema in his ankles and cough. Walking outside today.    Do you understand why you were in the hospital? yes   Do you understand the discharge instructions? yes   Where were you discharged to? Home. Lives with wife.    Items Reviewed:  Medications reviewed: yes. Discharged on Oxygen, 3L.   Allergies reviewed: yes  Dietary changes reviewed: yes  Referrals reviewed: yes   Functional Questionnaire:   Activities of Daily Living (ADLs):   He states they are independent in the following: ambulation, bathing and hygiene, feeding, continence, grooming, toileting and dressing States they require assistance with the following: None.    Any transportation issues/concerns?: no   Any patient concerns? no   Confirmed importance and date/time of follow-up visits scheduled yes  Provider Appointment booked with PCP 04/06/2018  Confirmed with patient if condition begins to worsen call PCP or go to the ER.  Patient was given the office number and encouraged to call back with question or concerns.  : yes

## 2018-04-03 ENCOUNTER — Other Ambulatory Visit: Payer: Self-pay | Admitting: *Deleted

## 2018-04-03 NOTE — Telephone Encounter (Signed)
Noted: nurse phone contact on 04/02/18 with patient for TCM. Signed:  Crissie Sickles, MD           04/03/2018

## 2018-04-03 NOTE — Patient Outreach (Addendum)
[name]  04/03/2018  William [name] 1951/02/02 [id number]   Transition of care call   Referral received: 03/27/18 Initial outreach: 04/02/18 Insurance: [facility] Health Choice Plan   Subjective: This RNCM returned call to wife after she left a message (at patient's direction)  at 4:02 pm on 04/02/18 in response to voice mail left earlier yesterday requesting return call. 2 HIPAA identifiers verified. Explained purpose of call and completed transition of care assessment. William [name] states William [name] is doing better, still using home oxygen at 2.5 liter/min/nasal cannula. She says his oxygen saturations remain in the low's 90's% with activity and while at rest.  She says the edema in his ankles has improved since yesterday and his weight was down 3 lbs since yesterday. She states William [name] weighs daily and she was able to correctly state the weight gain guidelines for notifying Dr Claiborne Billings. She denies the need for information on a low sodium heart healthy diet.  William [name] states they are both active with [employer] disease management program and are receiving the healthy lifestyle premium.  She says William [name] was able to lower his Hbg A1C to it's most recent value of 4.7% on 01/26/18 without the need for medication by losing approximately 30 lbs after he was told of his diagnosis in 2017.     Objective:   William [name] was hospitalized at [facility] from 2/17-2/22/20 for acute reparatory failure, sepsis, fever,  related to community acquired pneumonia.  Comorbidities include: chronic diastolic heart failure, chronic GI bleed, nonalcoholic cirrhosis, Type 2 DM- on no DM medications, chronic renal failure,  chronic anemia, CAD with hx of CABG 07/9676, metabolic syndrome, mixed hyperlipidemia, thrombocytopenia, iron deficiency anemia due to chronic blood loss. He was discharged to home on 03/31/18 without the need for home health services but with home O2 at 3  l/min with O2 supplied by [facility].  Assessment:  Patient's wife voices good understanding of all discharge instructions.  See transition of care flowsheet for assessment details.   Plan:  Reviewed William [name] [employer] 2020 Wellness Requirements of: Completing the computerized Health Assessment and the Health Action Step with Active [employer] [name] by October 09 2018 AND have an annual physical between February 07, 2017 and August 08, 2018.  No ongoing care management needs identified so will close case to [facility] care management services and route successful outreach letter with [place] Management pamphlet and 24 Hour Nurse Line Magnet to [name] clinical pool to be mailed to patient's home address.   [name] RN,CCM,CDE [place] Management Coordinator Office Phone [phone] Office Fax [phone]

## 2018-04-05 ENCOUNTER — Ambulatory Visit: Payer: 59 | Admitting: *Deleted

## 2018-04-06 ENCOUNTER — Inpatient Hospital Stay: Payer: 59 | Admitting: Family Medicine

## 2018-04-10 ENCOUNTER — Encounter: Payer: Self-pay | Admitting: Family Medicine

## 2018-04-10 ENCOUNTER — Ambulatory Visit (INDEPENDENT_AMBULATORY_CARE_PROVIDER_SITE_OTHER): Payer: 59 | Admitting: Family Medicine

## 2018-04-10 VITALS — BP 102/64 | HR 66 | Temp 98.6°F | Resp 16 | Ht 68.5 in | Wt 219.4 lb

## 2018-04-10 DIAGNOSIS — J9601 Acute respiratory failure with hypoxia: Secondary | ICD-10-CM

## 2018-04-10 DIAGNOSIS — D696 Thrombocytopenia, unspecified: Secondary | ICD-10-CM

## 2018-04-10 DIAGNOSIS — D500 Iron deficiency anemia secondary to blood loss (chronic): Secondary | ICD-10-CM

## 2018-04-10 DIAGNOSIS — K7460 Unspecified cirrhosis of liver: Secondary | ICD-10-CM

## 2018-04-10 DIAGNOSIS — J189 Pneumonia, unspecified organism: Secondary | ICD-10-CM | POA: Diagnosis not present

## 2018-04-10 DIAGNOSIS — K7200 Acute and subacute hepatic failure without coma: Secondary | ICD-10-CM

## 2018-04-10 DIAGNOSIS — R911 Solitary pulmonary nodule: Secondary | ICD-10-CM

## 2018-04-10 NOTE — Progress Notes (Signed)
04/10/2018  CC:  Chief Complaint  Patient presents with  . Hospitalization Follow-up    TCM    Patient is a 68 y.o. Caucasian male who presents accompanied by his wife for  hospital follow up, specifically Transitional Care Services face-to-face visit. Dates hospitalized: 2/17-2/22, 2020. Days since d/c from hospital: 9 days. Patient was discharged from hospital to home, with [facility] oxygen supplementation at 3L/min, hopefully to ween gradually to RA. Reason for admission to hospital: pneumonia with acute hypoxic RF. Date of interactive (phone) contact with patient and/or caregiver: 04/02/2018  I have reviewed patient's discharge summary plus pertinent specific notes, labs, and imaging from the hospitalization.   He gradually improved with abx and oxygen, needs f/u CT chest in 3-4 wks to f/u LUL opacity and R lung nodular lesions to r/o malignancy. He was d/c'd home with oxygen, on cefdinir.  NO other new meds. He had AKI but sCr returned to baseline prior to d/c home.  His cirrhosis with chronic thrombocytopenia stayed stable and he remained on lactulose--32m bid (titrating to get 3 stools per day).   His chronic diastolic HF remained stable. He has long hx of GI bleeding and anemia w/out known source: no problems with this in hospitalization. Hx of hypokalemia, is on potassium supplement, last K 3.3 prior to d/c, needs recheck.  Has begun to feel better the last couple days: gets SOB and desats to 70s when he walks 15-20 steps and recovers rapidly with rest.  No fevers.  Cough is very much improved, he is sleeping fine. Wt was up from IVF in hosp, but has come off lately, wt was 218 at home 2d ago, 219 here today.  Medication reconciliation was done today and patient is taking meds as recommended by discharging hospitalist/specialist.  Finished abx.  PMH:  Past Medical History:  Diagnosis Date  . [place]dominal [place]) [place]   3.2 cm on MR abd.  F/u aortic u/s 06/2014  showed 3.0 x 3.1 cm AAA: recheck 2 yrs recommended (followed by cardiologist).  Minimal growth on 10/2016 CT abd done for epig pain.  Repeat u/s 10/2018.  .Marland KitchenAnemia due to chronic blood loss 2018/19   GI: transfusions x >20 required; multiple endoscopies and bleeding scans unrevealing.  Hemolysis ruled out by hematologist.  LMarshell Levanand octreotide + monthly iron infusions as of 04/2017.  . Bilateral renal cysts    simple (03/2014 MRI)  . CAD (coronary artery disease)   . Cholelithiases 2018   asymptomatic  . Chronic diastolic heart failure ([facility]   . Cirrhosis ([place] 05/2016   secondary to NASH; most recent u/s abd 07/2016 showed splenomegaly.  Hx of portal HTN changes with mild ascites and splenomegaly.  On lactulose as of 2019.  . Diabetes mellitus with complication ([place] 033/8250  A1c 6.8%  . History of blood transfusion 2018 X 4 dates   "low blood" (09/21/2016)  . Hyperlipemia, mixed    elevated LFTs when on statins.    . Hypertension    Cr bump 04/01/16 so I changed benicar-hct to benicar plain and added amlodipine 5 mg.  . Iron deficiency anemia 05/2016   Acute blood loss anemia: hospitalized, required transfusion x 3 U: colonoscopy and capsule study unrevealing.  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  W/u for ? hemolytic anemia to be pursued by hematologist as outpt.  [facility]  onc do bone marrow bx as of Jan 2019  . Lung field abnormal finding on examination    Bibasilar L>R mild insp crackles-->x-ray showed mild interstitial changes/fibrosis/scarring.  Changes noted on all CXRs in 2018/2019.  . Microscopic hematuria    Eval unremarkable by Dr. Eulogio Ditch.  [name] NASH (nonalcoholic steatohepatitis)    Fatty liver on MR abd 03/2014, + hx of elevated transaminases and bili: followed by Dr. [name].  . Obesity   . Spleen enlarged     PSH:  Past Surgical History:   Procedure Laterality Date  . CARDIAC CATHETERIZATION    . CARDIOVASCULAR STRESS TEST  01/17/2012; 05/04/16   Normal stress nuclear study x 2 (2018--Normal perfusion. LVEF 66% with normal wall motion. This is a low risk study).  . CERVICAL DISCECTOMY  1992  . COLONOSCOPY N/A 05/27/2016   No site/explanation for blood loss found.  Erythematous mucosa in cecum and ascending colon--Cecal bx: normal.  Procedure: COLONOSCOPY;  Surgeon: Carol Ada, MD;  Location: [facility] ENDOSCOPY;  Service: Endoscopy;  Laterality: N/A;  . COLONOSCOPY W/ POLYPECTOMY  09/10/2014   Polypectomy x 2: recall 5 yrs (Dr. [name]).  . CORONARY ANGIOPLASTY    . CORONARY ARTERY BYPASS GRAFT  03/2006  . ENTEROSCOPY N/A 07/31/2016   Procedure: ENTEROSCOPY;  Surgeon: Ladene Artist, MD;  Location: [facility] ENDOSCOPY;  Service: Endoscopy;  Laterality: N/A;  . ENTEROSCOPY N/A 12/02/2016   Procedure: ENTEROSCOPY;  Surgeon: Carol Ada, MD;  Location: [facility] ENDOSCOPY;  Service: Endoscopy;  Laterality: N/A;  . ESOPHAGOGASTRODUODENOSCOPY N/A 05/25/2016   No site/explanation for blood loss found.  Procedure: ESOPHAGOGASTRODUODENOSCOPY (EGD);  Surgeon: Juanita Craver, MD;  Location: [facility] ENDOSCOPY;  Service: Endoscopy;  Laterality: N/A;  . ESOPHAGOGASTRODUODENOSCOPY N/A 09/23/2016   Procedure: ESOPHAGOGASTRODUODENOSCOPY (EGD);  Surgeon: Carol Ada, MD;  Location: [place];  Service: Endoscopy;  Laterality: N/A;  . GIVENS CAPSULE STUDY N/A 05/27/2016   No source identified.  Repeat 10/2016--results pending.  Procedure: GIVENS CAPSULE STUDY;  Surgeon: Carol Ada, MD;  Location: [facility] ENDOSCOPY;  Service: Endoscopy;  Laterality: N/A;  . GIVENS CAPSULE STUDY N/A 10/15/2016   Procedure: GIVENS CAPSULE STUDY;  Surgeon: Carol Ada, MD;  Location: [facility] ENDOSCOPY;  Service: Endoscopy;  Laterality: N/A;  . GIVENS CAPSULE STUDY N/A 02/13/2017   Procedure: GIVENS CAPSULE STUDY;  Surgeon: Carol Ada, MD;  Location: [place];  Service: Endoscopy;  Laterality:  N/A;  . NECK SURGERY  1991  . NM GI BLOOD LOSS  01/27/2017   NEGATIVE  . SMALL BOWEL ENTEROSCOPY  10/2016   ([facility], Dr. [name]: Double balloon enteroscopy--to the level of proximal ileum) jejunal polyps- which were removed--not bleeding & benign.  No source of bleeding was identified.  . TRANSTHORACIC ECHOCARDIOGRAM  05/25/2016; 10/2017   05/2016: EF 60%, normal wall motion, grd II DD, mild aortic stenosis, dilated aortic root and ascending aorta. 10/2017: Mild LVH; no mention made of LV function.  Moderate aorticstenosis with mean gradient 19 peak gradient 41. AVA 1.1 cm^2.  Mild ascending aorta dilation.  Mean gradient slightly lower than in 2018.  [name] VASECTOMY  1977    MEDS:  Outpatient Medications Prior to Visit  Medication Sig Dispense Refill  . fluticasone (FLONASE) 50 MCG/ACT nasal spray Place 2 sprays into both nostrils daily. (Patient taking differently: Place 2 sprays into both nostrils daily as needed for allergies. ) 16 g 6  . lactulose (CHRONULAC) 10 GM/15ML solution Take 45 mLs (30 g total) by mouth 2 (two) times daily as needed for mild constipation.  Take this until you have three BM's a day.    . metoprolol tartrate (LOPRESSOR) 25 MG tablet TAKE 1/2 TABLET BY MOUTH TWICE A DAY (Patient taking differently: Take 12.5 mg by mouth 2 (two) times daily. ) 30 tablet 5  . ondansetron (ZOFRAN) 4 MG tablet Take 1 tablet (4 mg total) by mouth every 8 (eight) hours as needed for nausea or vomiting. 10 tablet 1  . pantoprazole (PROTONIX) 40 MG tablet TAKE 1 TABLET BY MOUTH TWICE DAILY BEFORE A MEAL (Patient taking differently: Take 40 mg by mouth 2 (two) times daily. ) 60 tablet 3  . potassium chloride SA (K-DUR,KLOR-CON) 20 MEQ tablet TAKE 1 TABLET (20 MEQ TOTAL) BY MOUTH TWICE A DAY (Patient taking differently: Take 20 mEq by mouth daily. ) 60 tablet 6  . spironolactone (ALDACTONE) 25 MG tablet Take 12.5 mg by mouth daily.    [name] torsemide (DEMADEX) 20 MG tablet TAKE 2 TABLETS BY MOUTH DAILY.  (Patient taking differently: Take 40 mg by mouth daily. ) 60 tablet 3  . tranexamic acid (LYSTEDA) 650 MG TABS tablet TAKE 2 TABLETS BY MOUTH 3 TIMES DAILY. (Patient taking differently: Take 1,300 mg by mouth 3 (three) times daily. ) 180 tablet 1  . cefdinir (OMNICEF) 300 MG capsule Take 1 capsule (300 mg total) by mouth 2 (two) times daily. (Patient not taking: Reported on 04/10/2018) 6 capsule 0   Facility-Administered Medications Prior to Visit  Medication Dose Route Frequency Provider Last Rate Last Dose  . heparin lock flush 100 unit/mL  500 Units Intracatheter Daily PRN Truitt Merle, MD      . sodium chloride flush (NS) 0.9 % injection 10 mL  10 mL Intracatheter PRN Truitt Merle, MD       EXAM: BP 102/64 (BP Location: Left Arm, Patient Position: Sitting, Cuff Size: Normal)   Pulse 66   Temp 98.6 F (37 C) (Oral)   Resp 16   Ht 5' 8.5" (1.74 m)   Wt 219 lb 6.4 oz (99.5 kg)   SpO2 (!) 85% Comment: 3L  BMI 32.87 kg/m   Gen: Alert, tired-appearing.  NAD, nontoxic.  Patient is oriented to person, place, time, and situation. ENT: +icterus mild, without eyes swelling, or exudate.  EOMI, PERRLA. Mouth: lips without lesion/swelling.  Oral mucosa pink and moist. Oropharynx without erythema, exudate, or swelling.  CV: RRR, 3/6 systolic murmur unchanged from previous exams, w/out rub/gallop.  No diastolic murmur. LUNGS: CTA bilat except soft insp crackles in lower 1/3 of L posterior lung field. Nonlabored resps.  Exp phase normal.  Good aeration. ABD: soft, NT/ND. EXT: no clubbing or cyanosis.  no edema.      Pertinent labs/imaging Lab Results  Component Value Date   WBC 9.0 03/28/2018   HGB 10.6 (L) 03/28/2018   HCT 33.3 (L) 03/28/2018   MCV 100.0 03/28/2018   PLT 61 (L) 03/28/2018     Chemistry      Component Value Date/Time   NA 133 (L) 03/28/2018 0539   NA 139 02/10/2017 0948   K 3.3 (L) 03/28/2018 0539   K 3.9 02/10/2017 0948   CL 104 03/28/2018 0539   CO2 21 (L) 03/28/2018  0539   CO2 24 02/10/2017 0948   BUN 11 03/28/2018 0539   BUN 10.4 02/10/2017 0948   CREATININE 1.13 03/28/2018 0539   CREATININE 1.09 03/14/2018 0928   CREATININE 1.3 02/10/2017 0948      Component Value Date/Time   CALCIUM 7.5 (L) 03/28/2018 8333  CALCIUM 8.9 02/10/2017 0948   ALKPHOS 90 03/28/2018 0539   ALKPHOS 108 02/10/2017 0948   AST 34 03/28/2018 0539   AST 50 (H) 03/14/2018 0928   AST 30 02/10/2017 0948   ALT 28 03/28/2018 0539   ALT 39 03/14/2018 0928   ALT 23 02/10/2017 0948   BILITOT 6.3 (H) 03/28/2018 0539   BILITOT 7.8 (HH) 03/14/2018 0928   BILITOT 4.97 (HH) 02/10/2017 0948     Lab Results  Component Value Date   HGBA1C 4.7 01/26/2018   03/26/2018 CT angio chest with contrast: IMPRESSION: 1. No pulmonary embolus evident. Note that contrast bolus is somewhat less than optimal, and there is a degree of patient motion, making assessment of the more peripheral pulmonary arteries less than optimal.  2. Ascending thoracic aorta measures 4.2 x 4.2 cm in transverse diameter. Recommend annual imaging followup by CTA or MRA. This recommendation follows 2010 ACCF/AHA/AATS/ACR/ASA/SCA/SCAI/SIR/STS/SVM Guidelines for the Diagnosis and Management of Patients with Thoracic Aortic Disease. Circulation. 2010; 121: E174-Y814. Aortic aneurysm NOS (ICD10-I71.9). No thoracic aortic dissection evident. There is aortic atherosclerosis. There are foci of calcification in visualized great vessels. There are foci of native coronary artery calcification. Patient is status post coronary artery bypass grafting.  3. Interstitial edema raising concern for a degree of underlying congestive heart failure. Atelectasis in both lower lobes noted. Consolidation in the posterior left base is concerning for pneumonia. There may be alveolar edema in this area as well.  4. There is an opacity in the anterior segment of the left upper lobe peripherally measuring 2.5 x 2.2 cm. This area  may represent a second focus of pneumonia. A neoplastic focus with adjacent atelectasis could present in this manner. Given this finding, a follow-up chest CT after treatment for pneumonia in 3-4 weeks is felt to be warranted to further evaluate this area. In addition, there are several subcentimeter nodular opacities on the right which warrant additional surveillance. Assuming that the larger lesion on the left clears after treatment for pneumonia, noncontrast chest CT at 3-6 months is recommended. If the nodules are stable at time of repeat CT, then future CT at 18-24 months (from today's scan) is considered optional for low-risk patients, but is recommended for high-risk patients. This recommendation follows the consensus statement: Guidelines for Management of Incidental Pulmonary Nodules Detected on CT Images: From the Fleischner Society 2017; Radiology 2017; 284:228-243.  5.  No appreciable adenopathy.  6. Evidence of hepatic cirrhosis. Incomplete visualization of enlarged spleen.  7.  Cholelithiasis.  8.  Diffuse idiopathic skeletal hyperostosis in the thoracic spine.  Aortic Atherosclerosis (ICD10-I70.0).  ASSESSMENT/PLAN:  1) Acute hypoxic resp failure secondary to CAP: He is improving, particularly the last 2-3 days-->appetite and energy level and breathing all better. Still requiring 3L oxygen, desats with ambulation but recovers quickly. Continue incentive spirometry.  No further abx at this time,but signs/symptoms to call or return for were reviewed and pt expressed understanding. He'll try weening oxygen as he is able, keep sat>92% at rest. F/u imaging to be done in 3 wks (see below).  2) LUL opacity on recent imaging in hosp:   Needs CT chest with contrast in 3-4 wks to f/u LUL opacity-->?focus of pneumonia vs malignancy-->ordered today. Also, even if LUL lesion determined to NOT be worrisome after all, he'll need noncontrast CT chest in 3-6 mo to follow  multiple pulm nodules.  3) Cirrhosis, nonalcoholic.  With chronic thrombocytopenia: he is in the beginning stages of potentially getting on [facility] list-->he  is awaiting his first visit.  4) Chronic anemia secondary to GI blood loss (source not determined): pt to see Dr. Irene Limbo in 2d and get iron infusion. He'll get CBC at that time. Will request that Dr. Irene Limbo please do CMET at that time as well.  5) chronic diastolic HF: Stable. Follow up lytes (see #4 above).   No new meds today    Medical decision making of moderate complexity was utilized today.  FOLLOW UP:   2 wks  Signed:  Crissie Sickles, MD           04/10/2018

## 2018-04-11 NOTE — Progress Notes (Signed)
HEMATOLOGY/ONCOLOGY CLINIC NOTE  Date of Service: 04/12/18  Patient Care Team: Tammi Sou, MD as PCP - General (Family Medicine) Franchot Gallo, MD as Consulting Physician (Urology) Troy Sine, MD as Consulting Physician (Cardiology) Brunetta Genera, MD as Consulting Physician (Hematology) [name], Gaspar Skeeters, MD as Consulting Physician (Gastroenterology) Carol Ada, MD as Consulting Physician (Gastroenterology) [name], RN as [place] [facility]  CHIEF COMPLAINTS/PURPOSE OF CONSULTATION:   F/u for anemia and ongoing GI bleeding.  HISTORY OF PRESENTING ILLNESS:   William [name] is a wonderful 68 y.o. male who has been referred to [place] by Dr .Anitra Lauth, Adrian Blackwater, MD for evaluation and management of Anemia.  Patient has a history of extensive medical comorbidities including liver cirrhosis related to [facility] with portal hypertension, esophageal varices and portal hypertensive gastropathy and splenomegaly, iron deficiency anemia, obesity, diabetes, AAA.  Patient was admitted in April 2018 with acute blood loss anemia and required transfusion of multiple units of PRBCs with iron profile suggestive of iron deficiency. No overt evidence of hemolysis noted. He had an EGD and colonoscopy that did not show any overt bleeding. Capsule endoscopy was unrevealing but the capsule past and only 27 minutes.  He follows with Dr. [name] who is his gastric oncologist.  Patient was again readmitted in June 2018 with symptomatically anemia and hemoglobin of 6.3. He underwent 4 units of PRBC transfusions again and was sent out on PPI and iron supplementation with the plan to follow-up with Dr. [name].  He was also given hematology referral. He had a repeat ultrasound of the abdomen on 07/29/2016 which showed significant increase in splenomegaly in 2 months suggesting significant portal hypertension versus some element of splenic sequestration.  He continues  to note intermittent black stools.  Hemoglobin is stable and improved today and is up to 11.1. Patient notes he feels better after his transfusions. Has not noted lower GI bleeding at this time. We discuss any other workup to rule out less likely other possibilities of his anemia.  INTERVAL HISTORY  [name] is here for a scheduled follow-up of his anemia that is primarily related to ongoing issues with GI bleeding and has not been controllable by multiple GI interventions. The patient's last visit with [place] was on 01/19/18. He is accompanied today by his wife. The pt reports that he is doing well overall.   The pt had pneumonia and sepsis in the interim and was admitted between 03/26/18 and 03/31/18. The pt has followed up with his PCP and is now using 3L of oxygen.  The pt notes that he has not noticed any blood in his stools and has had fair energy levels. The pt notes that he is being referred to [facility] by his GI Dr. [name].  Lab results today (04/12/18) of CBC w/diff and CMP is as follows: all values are WNL except for RDW at 18.8, Lymphs abs at 600, Eosinophils abs at 600, Sodium at 134, Glucose at 152, Creatinine at 1.30, Calcium at 8.7, Albumin at 3.0, AST at 58, Total Bilirubin at 9.8, GFR at 56. 04/12/18 Ferritin is at 303, 29% Saturation ratio  On review of systems, pt reports recent pneumonia infection, improving energy levels, and denies blood in the stools, concerns of blood loss, and any other symptoms.   MEDICAL HISTORY:  Past Medical History:  Diagnosis Date  . AAA (abdominal aortic aneurysm) ([place]) 03/2014   3.2 cm on MR abd.  F/u aortic u/s 06/2014 showed 3.0  x 3.1 cm AAA: recheck 2 yrs recommended (followed by cardiologist).  Minimal growth on 10/2016 CT abd done for epig pain.  Repeat u/s 10/2018.  [name] Anemia due to chronic blood loss 2018/19   GI: transfusions x >20 required; multiple endoscopies and bleeding scans unrevealing.  Hemolysis ruled out by hematologist.  Marshell Levan and octreotide +  monthly iron infusions as of 04/2017.  . Bilateral renal cysts    simple (03/2014 MRI)  . CAD (coronary artery disease)   . Cholelithiases 2018   asymptomatic  . Chronic diastolic heart failure ([place])   . Cirrhosis ([place]) 05/2016   secondary to NASH; most recent u/s abd 07/2016 showed splenomegaly.  Hx of portal HTN changes with mild ascites and splenomegaly.  On lactulose as of 2019.  . Diabetes mellitus with complication ([place]) 38/1829   A1c 6.8%  . History of blood transfusion 2018 X 4 dates   "low blood" (09/21/2016)  . Hyperlipemia, mixed    elevated LFTs when on statins.    . Hypertension    Cr bump 04/01/16 so I changed benicar-hct to benicar plain and added amlodipine 5 mg.  . Iron deficiency anemia 05/2016   Acute blood loss anemia: hospitalized, required transfusion x 3 U: colonoscopy and capsule study unrevealing.  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  W/u for ? hemolytic anemia to be pursued by hematologist as outpt.  Dr. [name], GI at [facility] following, too---he rec'd onc do bone marrow bx as of Jan 2019  . Lung field abnormal finding on examination    Bibasilar L>R mild insp crackles-->x-ray showed mild interstitial changes/fibrosis/scarring.  Changes noted on all CXRs in 2018/2019.  . Microscopic hematuria    Eval unremarkable by Dr. Eulogio Ditch.  [name] NASH (nonalcoholic steatohepatitis)    Fatty liver on MR abd 03/2014, + hx of elevated transaminases and bili: followed by Dr. [name].  . Obesity   . Spleen enlarged     SURGICAL HISTORY: Past Surgical History:  Procedure Laterality Date  . CARDIAC CATHETERIZATION    . CARDIOVASCULAR STRESS TEST  01/17/2012; 05/04/16   Normal stress nuclear study x 2 (2018--Normal perfusion. LVEF 66% with normal wall motion. This is a low risk study).  . CERVICAL DISCECTOMY  1992  . COLONOSCOPY N/A 05/27/2016   No site/explanation for blood loss found.   Erythematous mucosa in cecum and ascending colon--Cecal bx: normal.  Procedure: COLONOSCOPY;  Surgeon: Carol Ada, MD;  Location: [facility] ENDOSCOPY;  Service: Endoscopy;  Laterality: N/A;  . COLONOSCOPY W/ POLYPECTOMY  09/10/2014   Polypectomy x 2: recall 5 yrs (Dr. [name]).  . CORONARY ANGIOPLASTY    . CORONARY ARTERY BYPASS GRAFT  03/2006  . ENTEROSCOPY N/A 07/31/2016   Procedure: ENTEROSCOPY;  Surgeon: Ladene Artist, MD;  Location: [facility] ENDOSCOPY;  Service: Endoscopy;  Laterality: N/A;  . ENTEROSCOPY N/A 12/02/2016   Procedure: ENTEROSCOPY;  Surgeon: Carol Ada, MD;  Location: [facility] ENDOSCOPY;  Service: Endoscopy;  Laterality: N/A;  . ESOPHAGOGASTRODUODENOSCOPY N/A 05/25/2016   No site/explanation for blood loss found.  Procedure: ESOPHAGOGASTRODUODENOSCOPY (EGD);  Surgeon: Juanita Craver, MD;  Location: [facility] ENDOSCOPY;  Service: Endoscopy;  Laterality: N/A;  . ESOPHAGOGASTRODUODENOSCOPY N/A 09/23/2016   Procedure: ESOPHAGOGASTRODUODENOSCOPY (EGD);  Surgeon: Carol Ada, MD;  Location: [place];  Service: Endoscopy;  Laterality: N/A;  . GIVENS CAPSULE STUDY N/A 05/27/2016   No source identified.  Repeat 10/2016--results pending.  Procedure: GIVENS CAPSULE STUDY;  Surgeon: Carol Ada, MD;  Location: [facility] ENDOSCOPY;  Service: Endoscopy;  Laterality: N/A;  . GIVENS CAPSULE STUDY N/A 10/15/2016   Procedure: GIVENS CAPSULE STUDY;  Surgeon: Carol Ada, MD;  Location: [facility] ENDOSCOPY;  Service: Endoscopy;  Laterality: N/A;  . GIVENS CAPSULE STUDY N/A 02/13/2017   Procedure: GIVENS CAPSULE STUDY;  Surgeon: Carol Ada, MD;  Location: [place];  Service: Endoscopy;  Laterality: N/A;  . NECK SURGERY  1991  . NM GI BLOOD LOSS  01/27/2017   NEGATIVE  . SMALL BOWEL ENTEROSCOPY  10/2016   ([facility], Dr. [name]: Double balloon enteroscopy--to the level of proximal ileum) jejunal polyps- which were removed--not bleeding & benign.  No source of bleeding was identified.  . TRANSTHORACIC ECHOCARDIOGRAM  05/25/2016;  10/2017   05/2016: EF 60%, normal wall motion, grd II DD, mild aortic stenosis, dilated aortic root and ascending aorta. 10/2017: Mild LVH; no mention made of LV function.  Moderate aorticstenosis with mean gradient 19 peak gradient 41. AVA 1.1 cm^2.  Mild ascending aorta dilation.  Mean gradient slightly lower than in 2018.  [name] VASECTOMY  1977    SOCIAL HISTORY: Social History   Socioeconomic History  . Marital status: Married    Spouse name: Not on file  . Number of children: Not on file  . Years of education: Not on file  . Highest education level: Not on file  Occupational History  . Not on file  Social Needs  . Financial resource strain: Not on file  . Food insecurity:    Worry: Not on file    Inability: Not on file  . Transportation needs:    Medical: Not on file    Non-medical: Not on file  Tobacco Use  . Smoking status: Former Smoker    Types: Cigarettes  . Smokeless tobacco: Never Used  . Tobacco comment: QUIT I 1983  Substance and Sexual Activity  . Alcohol use: No  . Drug use: No  . Sexual activity: Yes  Lifestyle  . Physical activity:    Days per week: 0 days    Minutes per session: 0 min  . Stress: Not on file  Relationships  . Social connections:    Talks on phone: Not on file    Gets together: Not on file    Attends religious service: Not on file    Active member of club or organization: Not on file    Attends meetings of clubs or organizations: Not on file    Relationship status: Not on file  . Intimate partner violence:    Fear of current or ex partner: Not on file    Emotionally abused: Not on file    Physically abused: Not on file    Forced sexual activity: Not on file  Other Topics Concern  . Not on file  Social History Narrative   Married, 3 grown children, 3 GCs.   Educ: 10th grade.   Occupation: Retired Brewing technologist.   Tob: quit 1983, smoked about 20 pack-yr hx prior.   No alcohol.    FAMILY HISTORY: Family History  Problem Relation  Age of Onset  . Hypertension Mother   . Cancer - Other Mother        liver cancer  . Heart disease Father   . Heart attack Father   . Cancer - Lung Father   . Diabetes Father   . Liver disease Sister   . Anemia Sister   . Heart disease Sister   . Heart attack  Sister   . Heart disease Brother        CABG 20 YEARS AGO  . Hypertension Brother   . Mesothelioma Brother        HALF-BROTHER  . Cancer - Other Brother        CLL  . Cancer - Lung Brother        Mets to brain  . Cancer - Lung Sister        HALF-SISTER  . Liver disease Brother   . Heart disease Brother        CABG-2012  . Emphysema Maternal Grandfather   . Cancer - Other Paternal Grandmother        Stomach  . Heart attack Paternal Grandfather     ALLERGIES:  is allergic to statins.  MEDICATIONS:  Current Outpatient Medications  Medication Sig Dispense Refill  . fluticasone (FLONASE) 50 MCG/ACT nasal spray Place 2 sprays into both nostrils daily. (Patient taking differently: Place 2 sprays into both nostrils daily as needed for allergies. ) 16 g 6  . lactulose (CHRONULAC) 10 GM/15ML solution Take 45 mLs (30 g total) by mouth 2 (two) times daily as needed for mild constipation. Take this until you have three BM's a day.    . metoprolol tartrate (LOPRESSOR) 25 MG tablet TAKE 1/2 TABLET BY MOUTH TWICE A DAY (Patient taking differently: Take 12.5 mg by mouth 2 (two) times daily. ) 30 tablet 5  . ondansetron (ZOFRAN) 4 MG tablet Take 1 tablet (4 mg total) by mouth every 8 (eight) hours as needed for nausea or vomiting. 10 tablet 1  . pantoprazole (PROTONIX) 40 MG tablet TAKE 1 TABLET BY MOUTH TWICE DAILY BEFORE A MEAL (Patient taking differently: Take 40 mg by mouth 2 (two) times daily. ) 60 tablet 3  . potassium chloride SA (K-DUR,KLOR-CON) 20 MEQ tablet TAKE 1 TABLET (20 MEQ TOTAL) BY MOUTH TWICE A DAY (Patient taking differently: Take 20 mEq by mouth daily. ) 60 tablet 6  . spironolactone (ALDACTONE) 25 MG tablet Take  12.5 mg by mouth daily.    [name] torsemide (DEMADEX) 20 MG tablet TAKE 2 TABLETS BY MOUTH DAILY. (Patient taking differently: Take 40 mg by mouth daily. ) 60 tablet 3  . tranexamic acid (LYSTEDA) 650 MG TABS tablet TAKE 2 TABLETS BY MOUTH 3 TIMES DAILY. (Patient taking differently: Take 1,300 mg by mouth 3 (three) times daily. ) 180 tablet 1   No current facility-administered medications for this visit.    Facility-Administered Medications Ordered in Other Visits  Medication Dose Route Frequency Provider Last Rate Last Dose  . heparin lock flush 100 unit/mL  500 Units Intracatheter Daily PRN Truitt Merle, MD      . sodium chloride flush (NS) 0.9 % injection 10 mL  10 mL Intracatheter PRN Truitt Merle, MD        REVIEW OF SYSTEMS:    A 10+ POINT REVIEW OF SYSTEMS WAS OBTAINED including neurology, dermatology, psychiatry, cardiac, respiratory, lymph, extremities, GI, GU, Musculoskeletal, constitutional, breasts, reproductive, HEENT.  All pertinent positives are noted in the HPI.  All others are negative.   PHYSICAL EXAMINATION:   ECOG PERFORMANCE STATUS: 1 - Symptomatic but completely ambulatory  VS reivewed in Epic - stable.  GENERAL:alert, in no acute distress and comfortable SKIN: no acute rashes, no significant lesions EYES: conjunctiva are pink and non-injected, sclera anicteric OROPHARYNX: MMM, no exudates, no oropharyngeal erythema or ulceration NECK: supple, no JVD LYMPH:  no palpable lymphadenopathy in the cervical, axillary or  inguinal regions LUNGS: few crackles in left base HEART: regular rate & rhythm ABDOMEN:  normoactive bowel sounds , non tender, not distended. No palpable hepatosplenomegaly.  Extremity: no pedal edema PSYCH: alert & oriented x 3 with fluent speech NEURO: no focal motor/sensory deficits   LABORATORY DATA:  I have reviewed the data as listed  . CBC Latest Ref Rng & Units 04/12/2018 03/28/2018 03/27/2018  WBC 4.0 - 10.5 K/uL 6.6 9.0 9.8  Hemoglobin 13.0 - 17.0  g/dL 13.7 10.6(L) 10.8(L)  Hematocrit 39.0 - 52.0 % 42.2 33.3(L) 34.2(L)  Platelets 150 - 400 K/uL 162 61(L) 58(L)    CMP Latest Ref Rng & Units 04/12/2018 03/28/2018 03/27/2018  Glucose 70 - 99 mg/dL 152(H) 126(H) 116(H)  BUN 8 - 23 mg/dL 15 11 12   Creatinine 0.61 - 1.24 mg/dL 1.30(H) 1.13 1.16  Sodium 135 - 145 mmol/L 134(L) 133(L) 133(L)  Potassium 3.5 - 5.1 mmol/L 4.1 3.3(L) 3.4(L)  Chloride 98 - 111 mmol/L 100 104 106  CO2 22 - 32 mmol/L 26 21(L) 20(L)  Calcium 8.9 - 10.3 mg/dL 8.7(L) 7.5(L) 7.4(L)  Total Protein 6.5 - 8.1 g/dL 7.6 5.5(L) 5.6(L)  Total Bilirubin 0.3 - 1.2 mg/dL 9.8(H) 6.3(H) 6.9(H)  Alkaline Phos 38 - 126 U/L 111 90 95  AST 15 - 41 U/L 58(H) 34 51(H)  ALT 0 - 44 U/L 34 28 27    Lab Results  Component Value Date   FERRITIN 303 04/12/2018   . RADIOGRAPHIC STUDIES: I have personally reviewed the radiological images as listed and agreed with the findings in the report. Dg Chest 2 View  Result Date: 03/26/2018 CLINICAL DATA:  Patient is from home and transferred from PCP office today via [facility]. According to EMS, patient is experiencing shortness of breath, fever, and productive cough since Friday. When at PCP office, patients oxygen level was 85% on room airFormer smoker - diabetic - hx heart surgery EXAM: CHEST - 2 VIEW COMPARISON:  07/28/2017 FINDINGS: There are stable changes from prior CABG surgery. The cardiac silhouette is normal in size and configuration. No mediastinal or hilar masses. No convincing adenopathy. There are prominent bronchovascular and interstitial markings that are stable from the prior exam. No evidence of pneumonia or pulmonary edema. No pleural effusion or pneumothorax. Skeletal structures are intact. IMPRESSION: 1. No acute cardiopulmonary disease. 2. Chronically prominent bronchovascular markings and interstitial thickening stable from the prior exam. Electronically Signed   By: Lajean Manes M.D.   On: 03/26/2018 15:13   Ct  Angio Chest Pe W And/or Wo Contrast  Result Date: 03/26/2018 CLINICAL DATA:  Shortness of breath with fever and cough EXAM: CT ANGIOGRAPHY CHEST WITH CONTRAST TECHNIQUE: Multidetector CT imaging of the chest was performed using the standard protocol during bolus administration of intravenous contrast. Multiplanar CT image reconstructions and MIPs were obtained to evaluate the vascular anatomy. CONTRAST:  127m ISOVUE-370 IOPAMIDOL (ISOVUE-370) INJECTION 76% COMPARISON:  Chest radiograph March 26, 2018 FINDINGS: Cardiovascular: Contrast bolus is somewhat less than optimal. There is also a slight degree of motion artifact. Given these limitations, there is no pulmonary embolus appreciable. The ascending thoracic aorta measures 4.2 x 4.2 cm in diameter. No thoracic aortic dissection is evident. There are scattered foci of calcification in visualized great vessels. There is aortic atherosclerosis. Patient is status post coronary artery bypass grafting. There is native coronary artery calcification. There is no pericardial effusion or pericardial thickening evident. Mediastinum/Nodes: Visualized thyroid appears unremarkable. There are subcentimeter mediastinal lymph nodes. There  is no frank adenopathy by size criteria. No esophageal lesions are evident. Lungs/Pleura: There is diffuse interstitial thickening with likely degree of interstitial pulmonary edema. There is airspace opacity in the posterior segment of the left lower lobe, concerning for focal pneumonia. There is atelectatic change in both lower lobes as well. There is an area of apparent consolidation in the anterior segment of the left upper lobe measuring 2.5 x 2.2 cm, best seen on axial slice 59 series 10. On axial slice 33 series 10, there is an 8 x 8 mm nodular opacity in the anterior segment of the right upper lobe. There is a nodular opacity in the apical segment of the right upper lobe measuring 6 x 6 mm. There is an ill-defined opacity in the  superior segment of the right lower lobe on axial slice 59 series 10 measuring 7 x 6 mm. There is no appreciable pleural effusion. Upper Abdomen: There is cholelithiasis. Liver contour is nodular consistent with hepatic cirrhosis. Spleen is incompletely visualized but appears enlarged. There is upper abdominal aortic atherosclerosis. Musculoskeletal: Patient is status post median sternotomy. There is extensive degenerative change in the thoracic spine with diffuse idiopathic skeletal hyperostosis. There are no blastic or lytic bone lesions. There are no chest wall lesions evident. Review of the MIP images confirms the above findings. IMPRESSION: 1. No pulmonary embolus evident. Note that contrast bolus is somewhat less than optimal, and there is a degree of patient motion, making assessment of the more peripheral pulmonary arteries less than optimal. 2. Ascending thoracic aorta measures 4.2 x 4.2 cm in transverse diameter. Recommend annual imaging followup by CTA or MRA. This recommendation follows 2010 ACCF/AHA/AATS/ACR/ASA/SCA/SCAI/SIR/STS/SVM Guidelines for the Diagnosis and Management of Patients with Thoracic Aortic Disease. Circulation. 2010; 121: K562-B638. Aortic aneurysm NOS (ICD10-I71.9). No thoracic aortic dissection evident. There is aortic atherosclerosis. There are foci of calcification in visualized great vessels. There are foci of native coronary artery calcification. Patient is status post coronary artery bypass grafting. 3. Interstitial edema raising concern for a degree of underlying congestive heart failure. Atelectasis in both lower lobes noted. Consolidation in the posterior left base is concerning for pneumonia. There may be alveolar edema in this area as well. 4. There is an opacity in the anterior segment of the left upper lobe peripherally measuring 2.5 x 2.2 cm. This area may represent a second focus of pneumonia. A neoplastic focus with adjacent atelectasis could present in this manner.  Given this finding, a follow-up chest CT after treatment for pneumonia in 3-4 weeks is felt to be warranted to further evaluate this area. In addition, there are several subcentimeter nodular opacities on the right which warrant additional surveillance. Assuming that the larger lesion on the left clears after treatment for pneumonia, noncontrast chest CT at 3-6 months is recommended. If the nodules are stable at time of repeat CT, then future CT at 18-24 months (from today's scan) is considered optional for low-risk patients, but is recommended for high-risk patients. This recommendation follows the consensus statement: Guidelines for Management of Incidental Pulmonary Nodules Detected on CT Images: From the Fleischner Society 2017; Radiology 2017; 284:228-243. 5.  No appreciable adenopathy. 6. Evidence of hepatic cirrhosis. Incomplete visualization of enlarged spleen. 7.  Cholelithiasis. 8.  Diffuse idiopathic skeletal hyperostosis in the thoracic spine. Aortic Atherosclerosis (ICD10-I70.0). Electronically Signed   By: Lowella Grip III M.D.   On: 03/26/2018 18:28   US Abdomen Complete  Result Date: 03/27/2018 CLINICAL DATA:  Cirrhosis. EXAM: ABDOMEN ULTRASOUND COMPLETE COMPARISON:  GI bleeding study 01/27/2017.  CT 11/01/2016. FINDINGS: Gallbladder: Multiple gallstones. Gallbladder wall thickness 3.2 mm. This is slightly prominent. Cholecystitis could not be excluded. Negative Murphy sign. Common bile duct: Diameter: 1.4 mm Liver: Coarse nodular parenchymal pattern noted. This could be secondary to cirrhosis. A 1.5 cm simple cyst is noted in the right lobe of the liver. Portal vein is patent on color Doppler imaging with normal direction of blood flow towards the liver. IVC: No abnormality visualized. Pancreas: Visualized portion unremarkable. Spleen: Spleen is prominent at 14.5 cm with a volume of 1220.3 cc Right Kidney: Length: 12.4 cm. Echogenicity within normal limits. 4.0 cm simple cyst no  hydronephrosis visualized. Left Kidney: Length: 11.8 cm. Echogenicity within normal limits. No mass or hydronephrosis visualized. Abdominal aorta: No aneurysm visualized. Other findings: Mild ascites. IMPRESSION: 1. Multiple gallstones. Gallbladder wall slightly prominent at 3.2 mm. Cholecystitis can not be excluded. Negative Murphy sign. 2. Coarse nodular hepatic parenchymal pattern. This could be secondary to cirrhosis. Portal vein is patent with normal direction of flow. 1.5 cm simple cyst noted the right lobe of the liver. Splenomegaly. Mild ascites. 3.  4.0 cm simple cyst right kidney. Electronically Signed   By: Marcello Moores  Register   On: 03/27/2018 10:48    ASSESSMENT & PLAN:   68 y.o. male with multiple medical comorbidities with Karlene Lineman with liver cirrhosis with portal hypertension, esophageal varices and portal hypertensive gastropathy with ongoing chronic GI bleeding that has not been   #1 Chronic blood loss anemia.  Patient has had recurrent GI bleeding requiring > 20 units of PRBCs. since April 2018 and continues to have ongoing GI bleeding.  Previous workup showed LDH within normal limits at 220 and suggests against overt hemolysis. Sedimentation rate within normal limits. Coombs' test is negative. Myeloma panel and serum free light chains suggest no monoclonal paraproteinemia. PNH testing negative Slightly lower haptoglobin levels can be related to decreased hepatic production, low-level hemolysis due to transfusions or extravascular hemolysis due to splenic or hepatic sequestration.  small bowel endoscopy done on 12/02/2016: impression - The examined portion of the jejunum was normal.  - Normal examined duodenum. - Portal hypertensive gastropathy.  -Small (< 5 mm) esophageal varices. - No specimens collected.  Capsule Endoscopy on 02/15/17: No evidence of any bleeding during this examination. There was the possibility of an atypical AVM manifested as an erythematous patch. No evidence  of any ulcerations, erosions, masses, or polyps.  Recent rbc tagged study unrevealing. Recent labs on 05/05/2017 showed: Retic Ct Pct at 3.2, RBC at 3.34, and Hgb at 9.4.  PLAN:   -Discussed pt labwork today, 04/12/18; HGB normalized to 3.7, WBC normal at 6.6k. Total Bilirubin elevated at 9.8 with recent pneumonia and sepsis. 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 -he has had previous extensive GI evaluation with no overt source evidence of controllable GIB at this time. -Continue on lysteda 1328m po 3x daily given  ongoing GI bleeding. Will wean down if hgb continues to remain stable. -Will see the pt back in 12 weeks   -Continue IV Injectafer q4 weeks x 3 doses -continue q4weekly Sandostatin -labs q4weeks -RTC with Dr Irene Limbo in 12 weeks    All of the patients questions were answered with apparent satisfaction. The patient knows to call the clinic with any problems, questions or concerns.   The total time spent in the appt was 25 minutes and more than 50% was on counseling and direct patient cares.   Sullivan Lone MD [place] AAHIVMS [facility] Hematology/Oncology Physician [facility]  (Office):       [phone] (Work cell):  [phone] (Fax):           [phone]  I, Baldwin Jamaica, am acting as a scribe for Dr. Sullivan Lone.   .I have  reviewed the above documentation for accuracy and completeness, and I agree with the above. Brunetta Genera MD

## 2018-04-12 ENCOUNTER — Ambulatory Visit: Payer: 59

## 2018-04-12 ENCOUNTER — Inpatient Hospital Stay: Payer: 59 | Attending: Hematology | Admitting: Hematology

## 2018-04-12 ENCOUNTER — Other Ambulatory Visit: Payer: 59

## 2018-04-12 ENCOUNTER — Inpatient Hospital Stay: Payer: 59

## 2018-04-12 ENCOUNTER — Telehealth: Payer: Self-pay | Admitting: Hematology

## 2018-04-12 VITALS — BP 107/51 | HR 63 | Temp 97.7°F | Resp 16 | Ht 68.5 in | Wt 217.6 lb

## 2018-04-12 DIAGNOSIS — Z8719 Personal history of other diseases of the digestive system: Secondary | ICD-10-CM

## 2018-04-12 DIAGNOSIS — Z87891 Personal history of nicotine dependence: Secondary | ICD-10-CM

## 2018-04-12 DIAGNOSIS — K922 Gastrointestinal hemorrhage, unspecified: Secondary | ICD-10-CM | POA: Diagnosis not present

## 2018-04-12 DIAGNOSIS — E119 Type 2 diabetes mellitus without complications: Secondary | ICD-10-CM

## 2018-04-12 DIAGNOSIS — D500 Iron deficiency anemia secondary to blood loss (chronic): Secondary | ICD-10-CM

## 2018-04-12 DIAGNOSIS — K766 Portal hypertension: Secondary | ICD-10-CM

## 2018-04-12 DIAGNOSIS — Z9981 Dependence on supplemental oxygen: Secondary | ICD-10-CM

## 2018-04-12 LAB — COMPREHENSIVE METABOLIC PANEL
ALT: 34 U/L (ref 0–44)
ANION GAP: 8 (ref 5–15)
AST: 58 U/L — ABNORMAL HIGH (ref 15–41)
Albumin: 3 g/dL — ABNORMAL LOW (ref 3.5–5.0)
Alkaline Phosphatase: 111 U/L (ref 38–126)
BUN: 15 mg/dL (ref 8–23)
CO2: 26 mmol/L (ref 22–32)
Calcium: 8.7 mg/dL — ABNORMAL LOW (ref 8.9–10.3)
Chloride: 100 mmol/L (ref 98–111)
Creatinine, Ser: 1.3 mg/dL — ABNORMAL HIGH (ref 0.61–1.24)
GFR calc Af Amer: >60 mL/min (ref >60)
GFR calc non Af Amer: 56 mL/min — ABNORMAL LOW (ref >60)
Glucose, Bld: 152 mg/dL — ABNORMAL HIGH (ref 70–99)
Potassium: 4.1 mmol/L (ref 3.5–5.1)
Sodium: 134 mmol/L — ABNORMAL LOW (ref 135–145)
Total Bilirubin: 9.8 mg/dL — ABNORMAL HIGH (ref 0.3–1.2)
Total Protein: 7.6 g/dL (ref 6.5–8.1)

## 2018-04-12 LAB — CBC WITH DIFFERENTIAL/PLATELET
Abs Immature Granulocytes: 0.05 10*3/uL (ref 0.00–0.07)
Basophils Absolute: 0.1 10*3/uL (ref 0.0–0.1)
Basophils Relative: 2 %
Eosinophils Absolute: 0.6 10*3/uL — ABNORMAL HIGH (ref 0.0–0.5)
Eosinophils Relative: 10 %
HCT: 42.2 % (ref 39.0–52.0)
Hemoglobin: 13.7 g/dL (ref 13.0–17.0)
Immature Granulocytes: 1 %
Lymphocytes Relative: 10 %
Lymphs Abs: 0.6 10*3/uL — ABNORMAL LOW (ref 0.7–4.0)
MCH: 31.6 pg (ref 26.0–34.0)
MCHC: 32.5 g/dL (ref 30.0–36.0)
MCV: 97.2 fL (ref 80.0–100.0)
Monocytes Absolute: 1 10*3/uL (ref 0.1–1.0)
Monocytes Relative: 15 %
Neutro Abs: 4.2 10*3/uL (ref 1.7–7.7)
Neutrophils Relative %: 62 %
Platelets: 162 10*3/uL (ref 150–400)
RBC: 4.34 MIL/uL (ref 4.22–5.81)
RDW: 18.8 % — ABNORMAL HIGH (ref 11.5–15.5)
WBC: 6.6 10*3/uL (ref 4.0–10.5)
nRBC: 0 % (ref 0.0–0.2)

## 2018-04-12 LAB — IRON AND TIBC
Iron: 86 ug/dL (ref 45–182)
Saturation Ratios: 29 % (ref 17.9–39.5)
TIBC: 293 ug/dL (ref 250–450)
UIBC: 207 ug/dL

## 2018-04-12 LAB — FERRITIN: Ferritin: 303 ng/mL (ref 24–336)

## 2018-04-12 MED ORDER — OCTREOTIDE ACETATE 30 MG IM KIT
30.0000 mg | PACK | Freq: Once | INTRAMUSCULAR | Status: AC
  Administered 2018-04-12: 30 mg via INTRAMUSCULAR

## 2018-04-12 MED ORDER — OCTREOTIDE ACETATE 30 MG IM KIT
PACK | INTRAMUSCULAR | Status: AC
  Filled 2018-04-12: qty 1

## 2018-04-12 NOTE — Telephone Encounter (Signed)
Scheduled appt per 3/5 los.

## 2018-04-12 NOTE — Telephone Encounter (Signed)
Spoke with patient wife and rescheduled the appts to make sure they did not have two in the same month due to insurance.  Patient aware of appt date and time.

## 2018-04-12 NOTE — Progress Notes (Signed)
Per Dr. Irene Limbo: pt will only receive Sandostatin injection today. Redraw labs in a week to see if IV Injectafer indicated.

## 2018-04-12 NOTE — Patient Instructions (Signed)

## 2018-04-17 ENCOUNTER — Encounter: Payer: Self-pay | Admitting: Family Medicine

## 2018-04-17 IMAGING — CR DG CHEST 2V
2 series · 3 of 3 positions shown · non-contrast
Comparison: Two-view chest x-ray 04/27/2006

CLINICAL DATA: Shortness of breath.  Lower extremity swelling.

EXAM:
CHEST  2 VIEW

[chest pa]
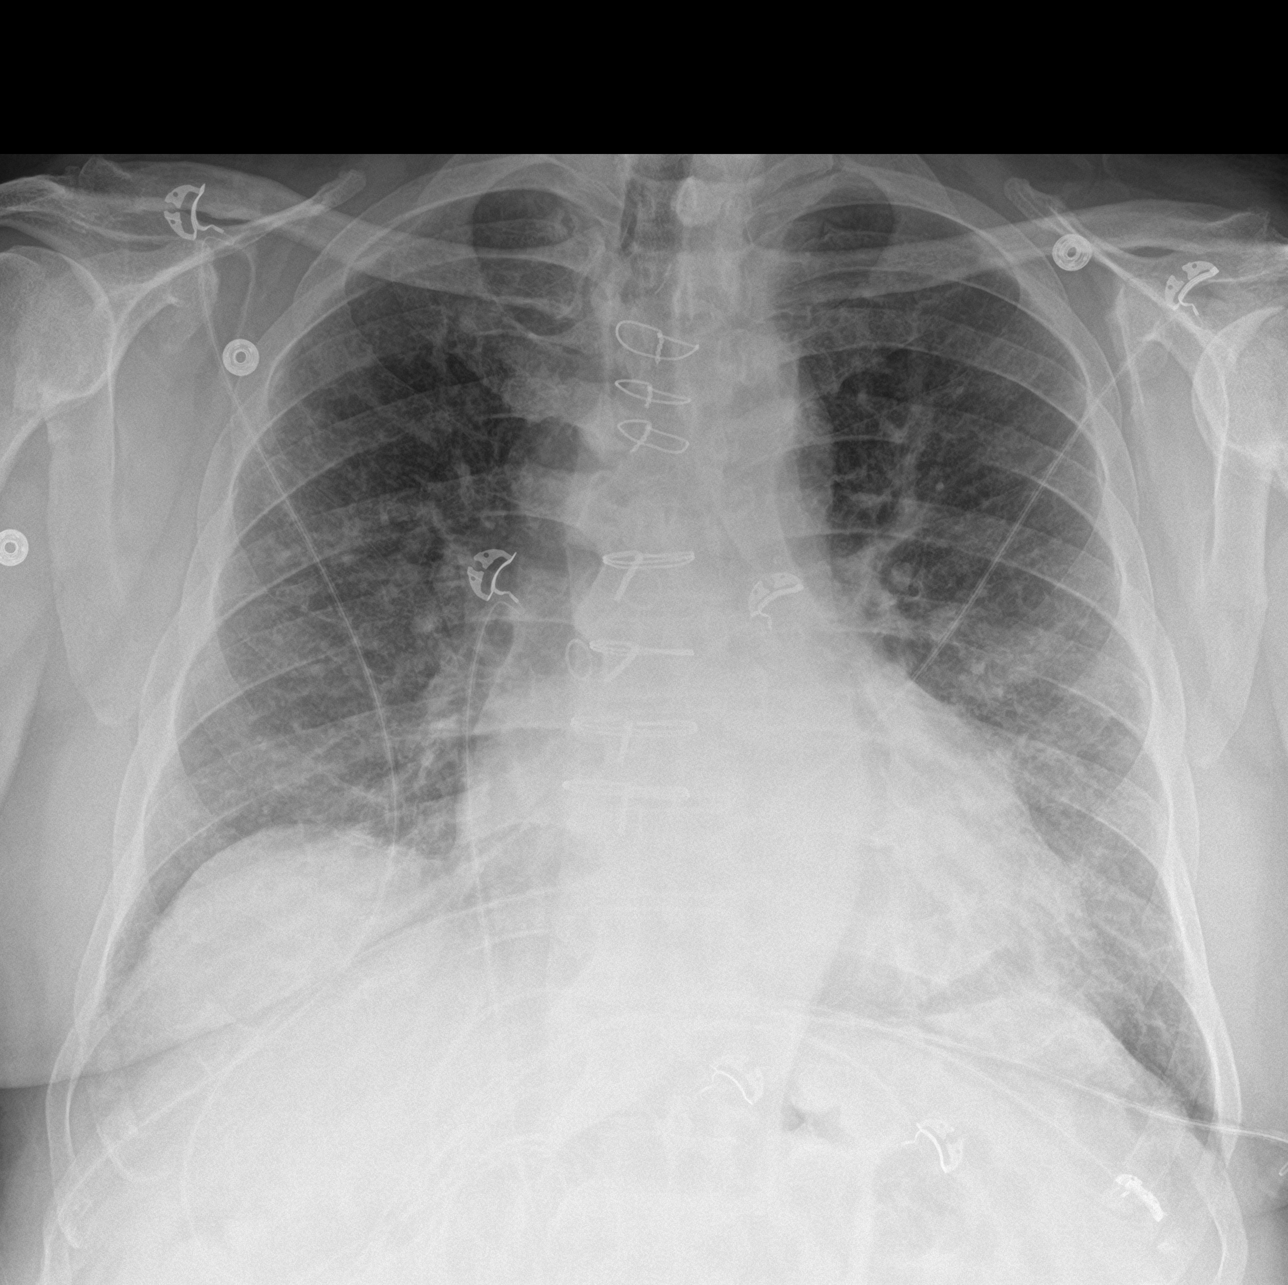

[Series 2: chest lat · 0.14mm/px · 2 of 2 slices shown]
[im 1/2]
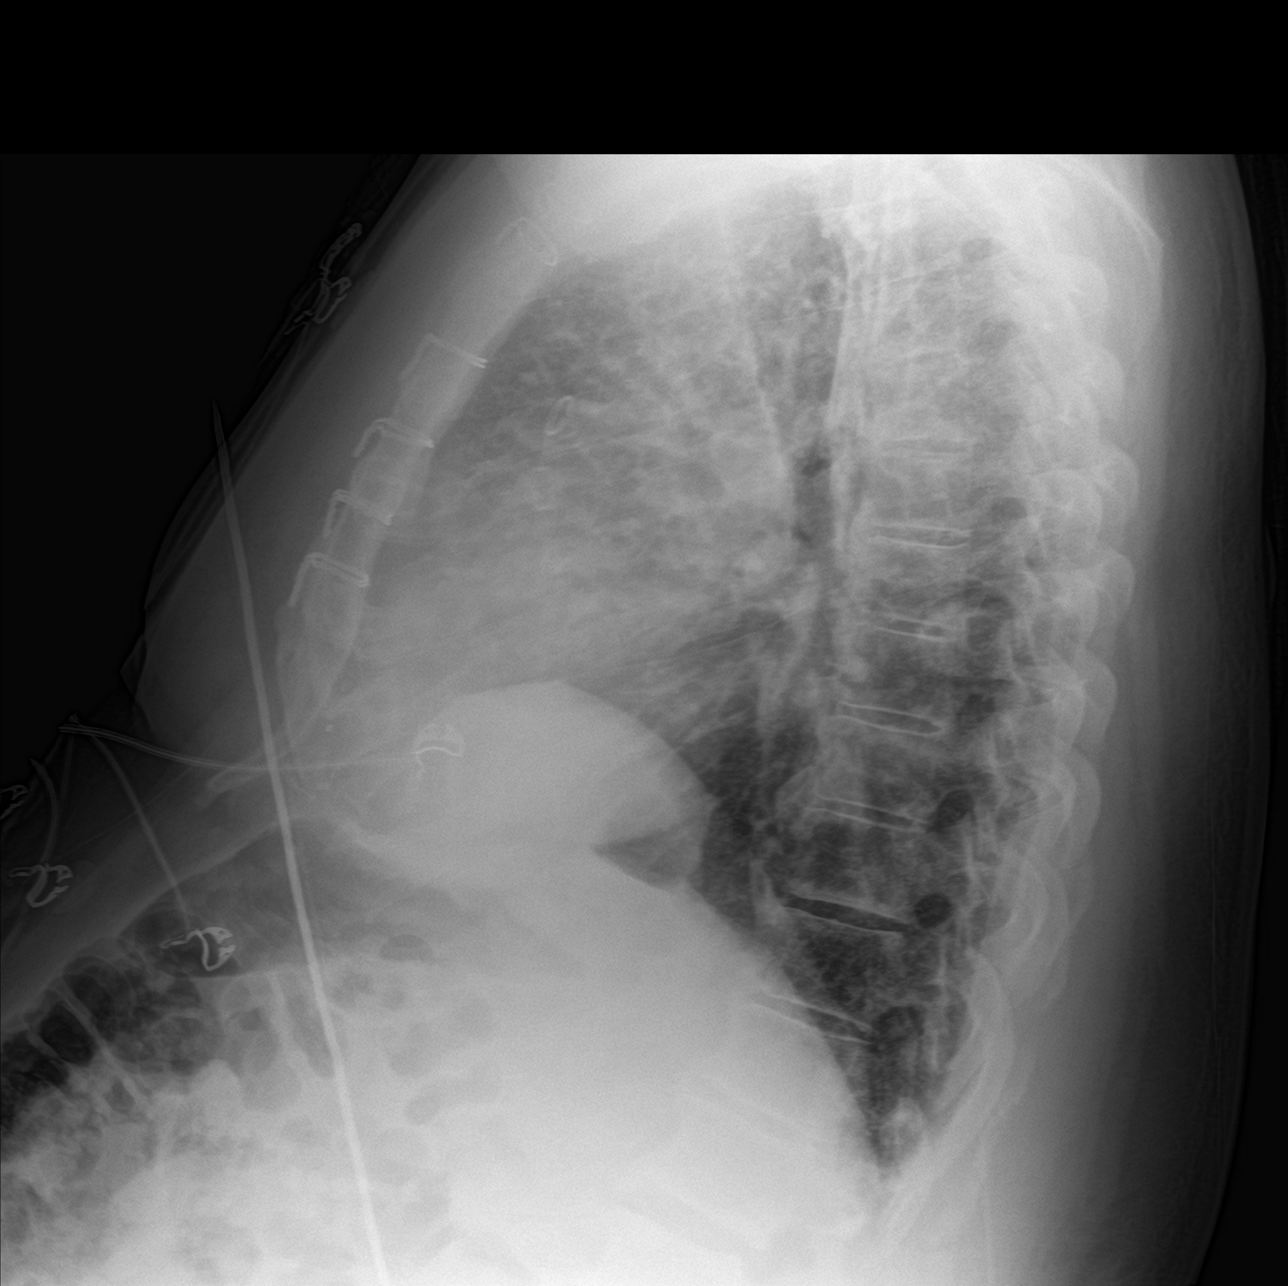
[im 2/2]
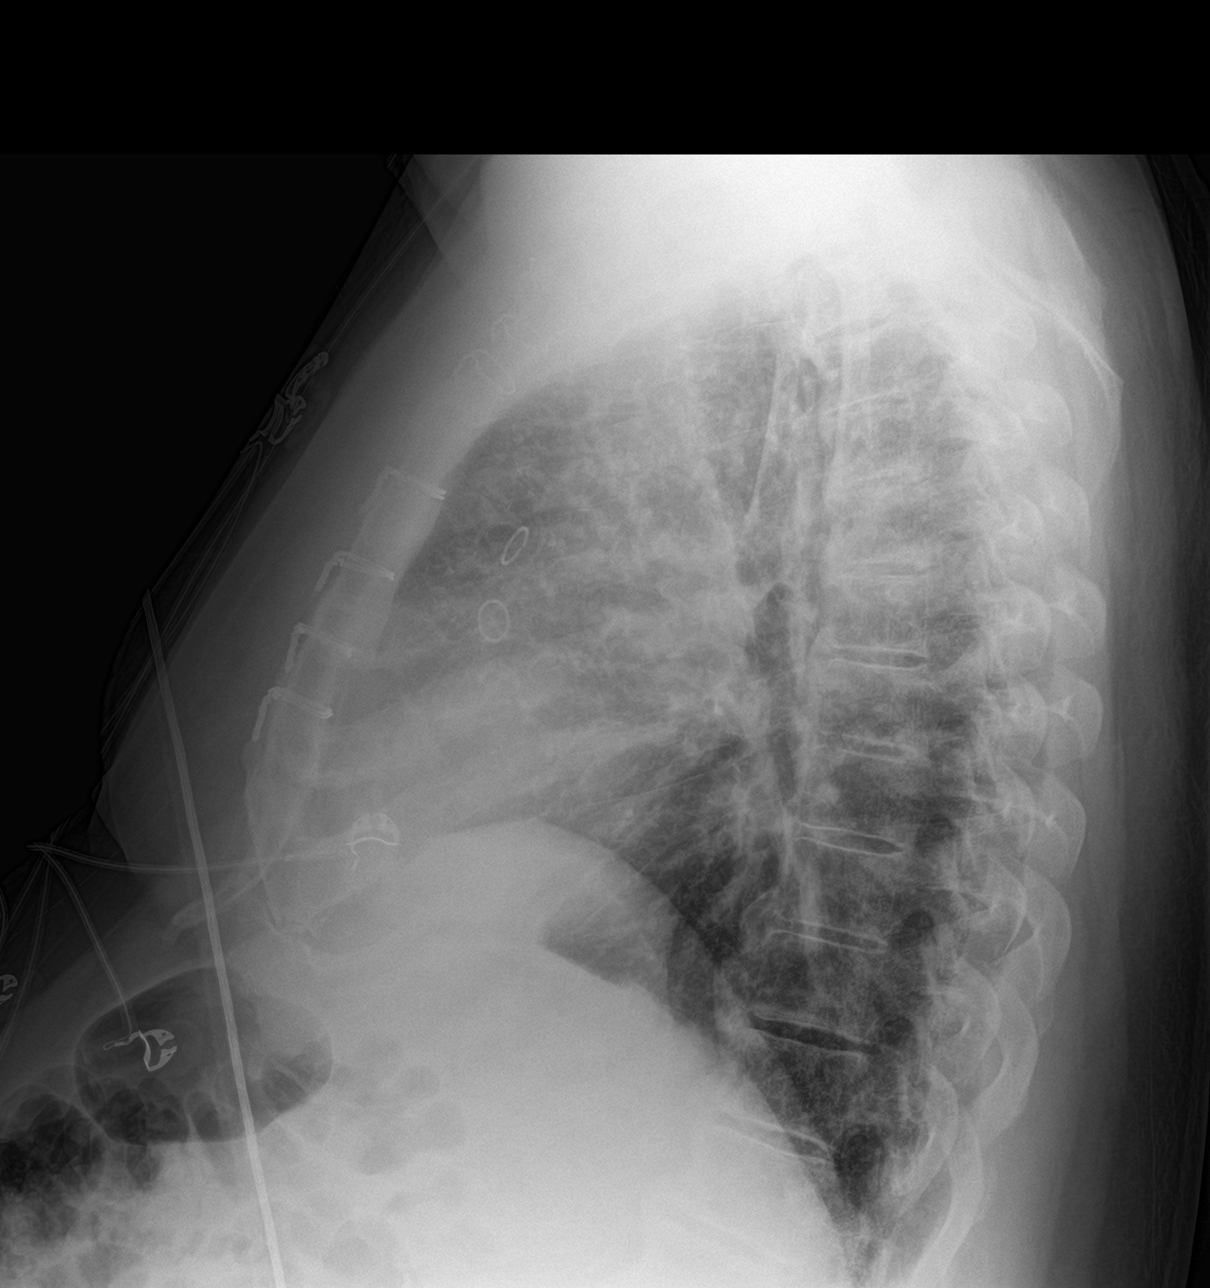

[3 of 3 positions shown; findings below may reference images not displayed]

FINDINGS: Heart is enlarged. A diffuse interstitial pattern is now present,
suggesting edema. There are no significant effusions. Mild bibasilar
atelectasis is present. No significant airspace consolidation is
present. The visualized soft tissues and bony thorax are
unremarkable.
IMPRESSION: 1. Cardiomegaly and mild edema compatible with congestive heart
failure.
2. Mild bibasilar airspace disease likely reflects atelectasis.

## 2018-04-17 MED FILL — SPIRONOLACTONE 25 MG TABLET: 25 | 30 days supply | Qty: 30 | Fill #2 | Status: TO

## 2018-04-17 NOTE — Telephone Encounter (Signed)
CT was ordered on 04/09/18. Please contact pt with apt date time. Thanks.

## 2018-04-18 IMAGING — DX DG CHEST 1V PORT
1 series · 1 of 1 positions shown · non-contrast
Comparison: Radiographs May 22, 2016.

CLINICAL DATA: Pulmonary edema.

EXAM:
PORTABLE CHEST 1 VIEW

[chest ap]
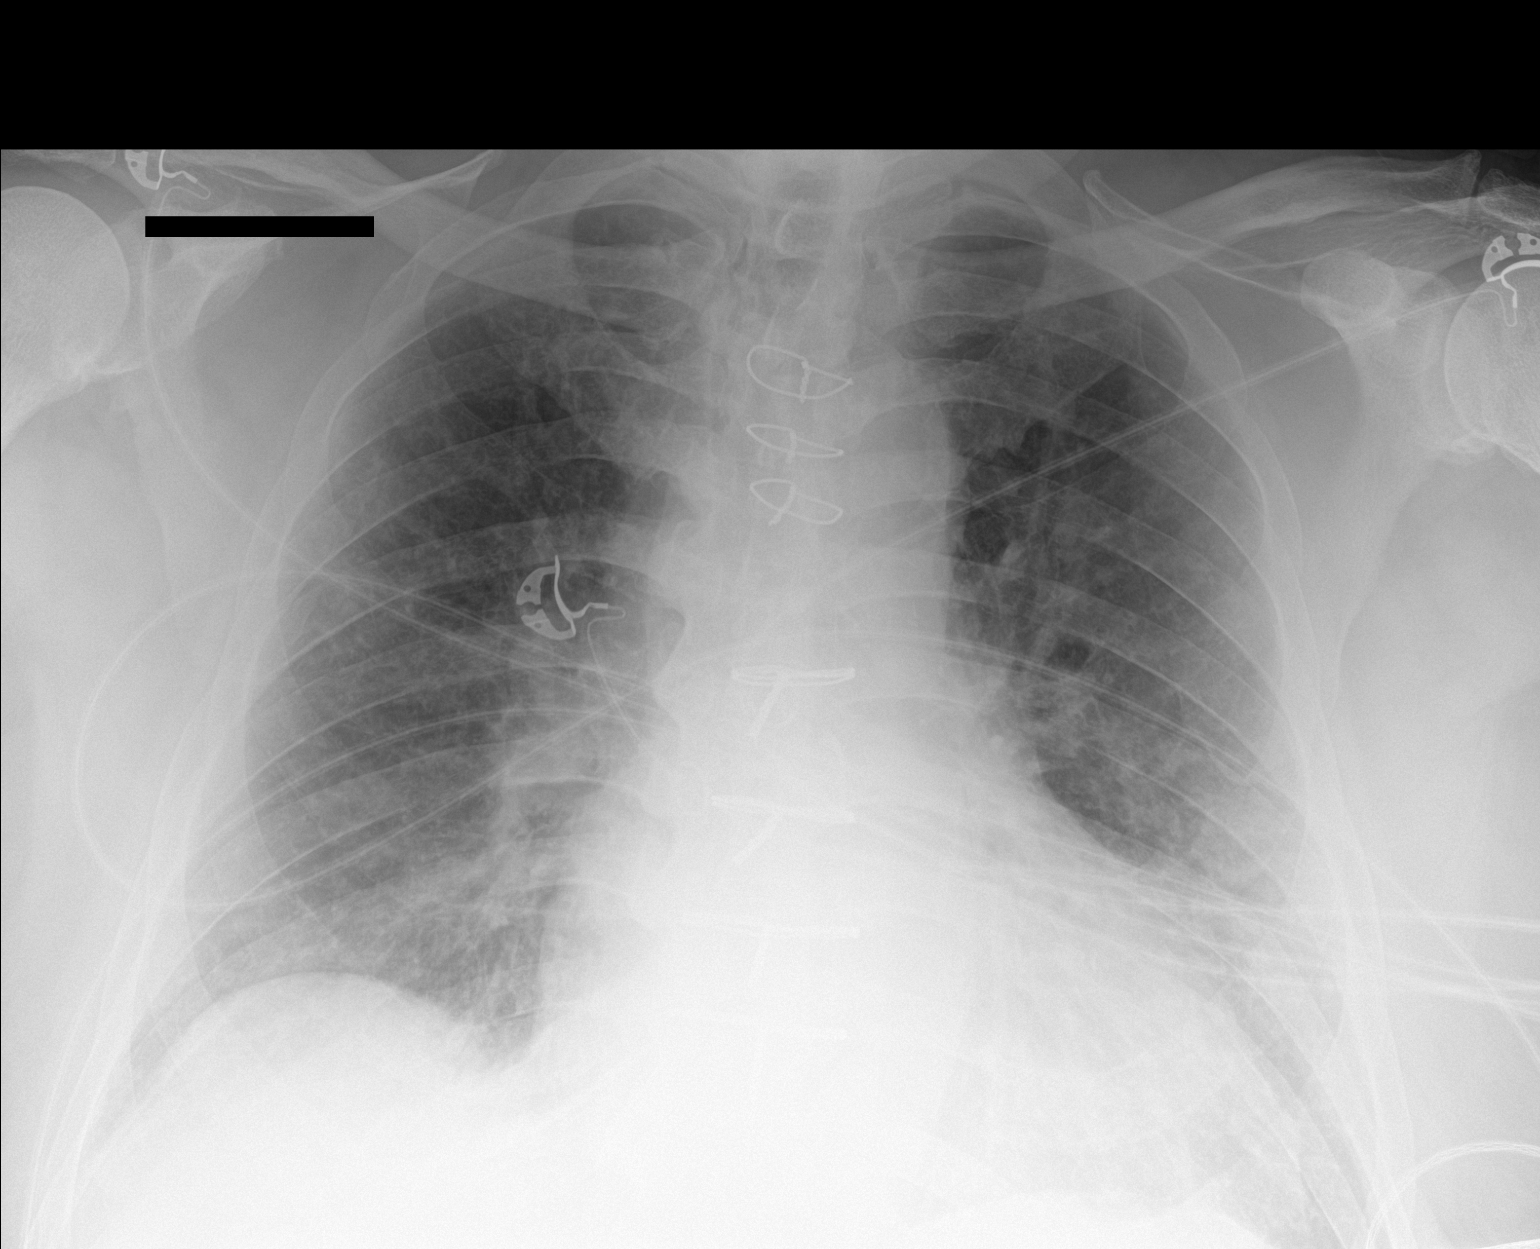

[1 of 1 positions shown; findings below may reference images not displayed]

FINDINGS: Stable cardiomegaly. Status post coronary artery bypass graft.
Stable bilateral perihilar and basilar opacities are noted
concerning for pulmonary edema. No pneumothorax or pleural effusion
is noted. Bony thorax is unremarkable.
IMPRESSION: Stable bilateral pulmonary edema.

## 2018-04-23 ENCOUNTER — Ambulatory Visit: Payer: 59 | Admitting: Family Medicine

## 2018-04-24 ENCOUNTER — Encounter: Payer: Self-pay | Admitting: Family Medicine

## 2018-04-24 NOTE — Telephone Encounter (Signed)
Do you want pts wife to send FLMA paperwork to pts [facility] doctors or do we need to fill out pw?  Please advise. Thanks.

## 2018-04-25 ENCOUNTER — Ambulatory Visit (HOSPITAL_COMMUNITY)
Admission: RE | Admit: 2018-04-25 | Discharge: 2018-04-25 | Disposition: A | Discharge status: Home / Self Care | Payer: 59 | Source: Ambulatory Visit | Attending: Family Medicine | Admitting: Family Medicine

## 2018-04-25 ENCOUNTER — Other Ambulatory Visit: Payer: Self-pay

## 2018-04-25 DIAGNOSIS — R05 Cough: Secondary | ICD-10-CM | POA: Diagnosis not present

## 2018-04-25 DIAGNOSIS — R911 Solitary pulmonary nodule: Secondary | ICD-10-CM | POA: Insufficient documentation

## 2018-04-25 DIAGNOSIS — R0602 Shortness of breath: Secondary | ICD-10-CM | POA: Diagnosis not present

## 2018-04-25 MED ORDER — IOHEXOL 300 MG/ML  SOLN
75.0000 mL | Freq: Once | INTRAMUSCULAR | Status: AC | PRN
  Administered 2018-04-25: 75 mL via INTRAVENOUS

## 2018-04-25 MED ORDER — SODIUM CHLORIDE (PF) 0.9 % IJ SOLN
INTRAMUSCULAR | Status: AC
  Filled 2018-04-25: qty 50

## 2018-04-25 NOTE — Telephone Encounter (Signed)
Ok to send FMLA to me. However, she needs to fill out as much detail as possible on the form, otherwise there is too much "guesswork" on my part.-thx

## 2018-04-26 ENCOUNTER — Encounter: Payer: Self-pay | Admitting: *Deleted

## 2018-04-26 ENCOUNTER — Other Ambulatory Visit: Payer: Self-pay | Admitting: Family Medicine

## 2018-04-26 MED FILL — POTASSIUM CHLORIDE CRYS ER: 20 | 30 days supply | Qty: 60 | Fill #3 | Status: TO

## 2018-04-26 MED FILL — TORSEMIDE 20 MG TABLET: 20 | 30 days supply | Qty: 60 | Fill #1 | Status: TO

## 2018-04-26 MED FILL — PANTOPRAZOLE SOD DR 40 MG T: 40 | 30 days supply | Qty: 60 | Fill #0 | Status: TO

## 2018-04-26 MED FILL — GENERLAC 10 GM/15 ML SOLN: 10 | 30 days supply | Qty: 2700 | Fill #6

## 2018-04-26 MED FILL — METOPROLOL TARTRATE 25 MG T: 25 | 30 days supply | Qty: 30 | Fill #3 | Status: TO

## 2018-04-26 MED FILL — TRANEXAMIC ACID 650 MG TAB: 650 | 30 days supply | Qty: 180 | Fill #1

## 2018-04-26 NOTE — Telephone Encounter (Signed)
FMLA paperwork has been received from [employer].

## 2018-04-29 DIAGNOSIS — J181 Lobar pneumonia, unspecified organism: Secondary | ICD-10-CM | POA: Diagnosis not present

## 2018-05-01 DIAGNOSIS — Z0279 Encounter for issue of other medical certificate: Secondary | ICD-10-CM

## 2018-05-08 ENCOUNTER — Other Ambulatory Visit: Payer: Self-pay | Admitting: Hematology

## 2018-05-08 MED FILL — SPIRONOLACTONE 25 MG TABS: 25 | 30 days supply | Qty: 30 | Fill #0

## 2018-05-08 MED FILL — TRANEXAMIC ACID 650 MG TAB: 650 | 30 days supply | Qty: 180 | Fill #0

## 2018-05-09 ENCOUNTER — Telehealth: Payer: Self-pay | Admitting: Cardiovascular Disease

## 2018-05-09 NOTE — Telephone Encounter (Signed)
New Message   William [name] with [facility] is calling to request a copy of the echocardiogram from 10/2017 to be placed on a disc and sent to them. Please call to discuss.

## 2018-05-10 ENCOUNTER — Other Ambulatory Visit: Payer: Self-pay

## 2018-05-10 ENCOUNTER — Inpatient Hospital Stay: Payer: 59

## 2018-05-10 ENCOUNTER — Inpatient Hospital Stay: Payer: 59 | Attending: Hematology

## 2018-05-10 VITALS — BP 128/63 | HR 65 | Temp 97.9°F | Resp 18

## 2018-05-10 DIAGNOSIS — D500 Iron deficiency anemia secondary to blood loss (chronic): Secondary | ICD-10-CM

## 2018-05-10 DIAGNOSIS — K922 Gastrointestinal hemorrhage, unspecified: Secondary | ICD-10-CM

## 2018-05-10 DIAGNOSIS — R918 Other nonspecific abnormal finding of lung field: Secondary | ICD-10-CM | POA: Diagnosis not present

## 2018-05-10 LAB — CBC WITH DIFFERENTIAL/PLATELET
Abs Immature Granulocytes: 0.01 10*3/uL (ref 0.00–0.07)
Basophils Absolute: 0.1 10*3/uL (ref 0.0–0.1)
Basophils Relative: 1 %
Eosinophils Absolute: 1 10*3/uL — ABNORMAL HIGH (ref 0.0–0.5)
Eosinophils Relative: 13 %
HCT: 37.7 % — ABNORMAL LOW (ref 39.0–52.0)
Hemoglobin: 12.4 g/dL — ABNORMAL LOW (ref 13.0–17.0)
Immature Granulocytes: 0 %
Lymphocytes Relative: 17 %
Lymphs Abs: 1.2 10*3/uL (ref 0.7–4.0)
MCH: 30.9 pg (ref 26.0–34.0)
MCHC: 32.9 g/dL (ref 30.0–36.0)
MCV: 94 fL (ref 80.0–100.0)
Monocytes Absolute: 1.2 10*3/uL — ABNORMAL HIGH (ref 0.1–1.0)
Monocytes Relative: 16 %
Neutro Abs: 3.8 10*3/uL (ref 1.7–7.7)
Neutrophils Relative %: 53 %
Platelets: 120 10*3/uL — ABNORMAL LOW (ref 150–400)
RBC: 4.01 MIL/uL — ABNORMAL LOW (ref 4.22–5.81)
RDW: 16.4 % — ABNORMAL HIGH (ref 11.5–15.5)
WBC: 7.2 10*3/uL (ref 4.0–10.5)
nRBC: 0 % (ref 0.0–0.2)

## 2018-05-10 LAB — FERRITIN: Ferritin: 110 ng/mL (ref 24–336)

## 2018-05-10 LAB — IRON AND TIBC
Iron: 61 ug/dL (ref 42–163)
Saturation Ratios: 20 % (ref 20–55)
TIBC: 310 ug/dL (ref 202–409)
UIBC: 249 ug/dL (ref 117–376)

## 2018-05-10 MED ORDER — SODIUM CHLORIDE 0.9 % IV SOLN
750.0000 mg | Freq: Once | INTRAVENOUS | Status: AC
  Administered 2018-05-10: 750 mg via INTRAVENOUS
  Filled 2018-05-10: qty 15

## 2018-05-10 MED ORDER — OCTREOTIDE ACETATE 30 MG IM KIT
PACK | INTRAMUSCULAR | Status: AC
  Filled 2018-05-10: qty 1

## 2018-05-10 MED ORDER — OCTREOTIDE ACETATE 30 MG IM KIT
30.0000 mg | PACK | Freq: Once | INTRAMUSCULAR | Status: AC
  Administered 2018-05-10: 30 mg via INTRAMUSCULAR

## 2018-05-10 MED ORDER — SODIUM CHLORIDE 0.9 % IV SOLN
INTRAVENOUS | Status: DC
  Administered 2018-05-10: 11:00:00 via INTRAVENOUS
  Filled 2018-05-10: qty 250

## 2018-05-10 NOTE — Patient Instructions (Signed)
Ferric carboxymaltose injection What is this medicine? FERRIC CARBOXYMALTOSE (ferr-ik car-box-ee-mol-toes) is an iron complex. Iron is used to make healthy red blood cells, which carry oxygen and nutrients throughout the body. This medicine is used to treat anemia in people with chronic kidney disease or people who cannot take iron by mouth. This medicine may be used for other purposes; ask your health care provider or pharmacist if you have questions. COMMON BRAND NAME(S): Injectafer What should I tell my health care provider before I take this medicine? They need to know if you have any of these conditions: -high levels of iron in the blood -liver disease -an unusual or allergic reaction to iron, other medicines, foods, dyes, or preservatives -pregnant or trying to get pregnant -breast-feeding How should I use this medicine? This medicine is for infusion into a vein. It is given by a health care professional in a hospital or clinic setting. Talk to your pediatrician regarding the use of this medicine in children. Special care may be needed. Overdosage: If you think you have taken too much of this medicine contact a poison control center or emergency room at once. NOTE: This medicine is only for you. Do not share this medicine with others. What if I miss a dose? It is important not to miss your dose. Call your doctor or health care professional if you are unable to keep an appointment. What may interact with this medicine? Do not take this medicine with any of the following medications: -deferoxamine -dimercaprol -other iron products This list may not describe all possible interactions. Give your health care provider a list of all the medicines, herbs, non-prescription drugs, or dietary supplements you use. Also tell them if you smoke, drink alcohol, or use illegal drugs. Some items may interact with your medicine. What should I watch for while using this medicine? Visit your doctor or  health care professional regularly. Tell your doctor if your symptoms do not start to get better or if they get worse. You may need blood work done while you are taking this medicine. You may need to follow a special diet. Talk to your doctor. Foods that contain iron include: whole grains/cereals, dried fruits, beans, or peas, leafy green vegetables, and organ meats (liver, kidney). What side effects may I notice from receiving this medicine? Side effects that you should report to your doctor or health care professional as soon as possible: -allergic reactions like skin rash, itching or hives, swelling of the face, lips, or tongue -dizziness -facial flushing Side effects that usually do not require medical attention (report to your doctor or health care professional if they continue or are bothersome): -changes in taste -constipation -headache -nausea, vomiting -pain, redness, or irritation at site where injected This list may not describe all possible side effects. Call your doctor for medical advice about side effects. You may report side effects to FDA at 1-800-FDA-1088. Where should I keep my medicine? This drug is given in a hospital or clinic and will not be stored at home. NOTE: This sheet is a summary. It may not cover all possible information. If you have questions about this medicine, talk to your doctor, pharmacist, or health care provider.  2019 Elsevier/Gold Standard (2016-03-10 09:40:29)  Octreotide injection solution What is this medicine? OCTREOTIDE (ok TREE oh tide) is used to reduce blood levels of growth hormone in patients with a condition called acromegaly. This medicine also reduces flushing and watery diarrhea caused by certain types of cancer. This medicine may be  used for other purposes; ask your health care provider or pharmacist if you have questions. COMMON BRAND NAME(S): Sandostatin, Sandostatin LAR What should I tell my health care provider before I take this  medicine? They need to know if you have any of these conditions: -gallbladder disease -kidney disease -liver disease -an unusual or allergic reaction to octreotide, other medicines, foods, dyes, or preservatives -pregnant or trying to get pregnant -breast-feeding How should I use this medicine? This medicine is for injection under the skin or into a vein (only in emergency situations). It is usually given by a health care professional in a hospital or clinic setting. If you get this medicine at home, you will be taught how to prepare and give this medicine. Allow the injection solution to come to room temperature before use. Do not warm it artificially. Use exactly as directed. Take your medicine at regular intervals. Do not take your medicine more often than directed. It is important that you put your used needles and syringes in a special sharps container. Do not put them in a trash can. If you do not have a sharps container, call your pharmacist or healthcare provider to get one. Talk to your pediatrician regarding the use of this medicine in children. Special care may be needed. Overdosage: If you think you have taken too much of this medicine contact a poison control center or emergency room at once. NOTE: This medicine is only for you. Do not share this medicine with others. What if I miss a dose? If you miss a dose, take it as soon as you can. If it is almost time for your next dose, take only that dose. Do not take double or extra doses. What may interact with this medicine? Do not take this medicine with any of the following medications: -cisapride -droperidol -general anesthetics -grepafloxacin -perphenazine -thioridazine This medicine may also interact with the following medications: -bromocriptine -cyclosporine -diuretics -medicines for blood pressure, heart disease, irregular heart beat -medicines for diabetes, including insulin -quinidine This list may not describe all  possible interactions. Give your health care provider a list of all the medicines, herbs, non-prescription drugs, or dietary supplements you use. Also tell them if you smoke, drink alcohol, or use illegal drugs. Some items may interact with your medicine. What should I watch for while using this medicine? Visit your doctor or health care professional for regular checks on your progress. To help reduce irritation at the injection site, use a different site for each injection and make sure the solution is at room temperature before use. This medicine may cause increases or decreases in blood sugar. Signs of high blood sugar include frequent urination, unusual thirst, flushed or dry skin, difficulty breathing, drowsiness, stomach ache, nausea, vomiting or dry mouth. Signs of low blood sugar include chills, cool, pale skin or cold sweats, drowsiness, extreme hunger, fast heartbeat, headache, nausea, nervousness or anxiety, shakiness, trembling, unsteadiness, tiredness, or weakness. Contact your doctor or health care professional right away if you experience any of these symptoms. This medicine may cause a decrease in vitamin B12. You should make sure that you get enough vitamin B12 while you are taking this medicine. Discuss the foods you eat and the vitamins you take with your health care professional. What side effects may I notice from receiving this medicine? Side effects that you should report to your doctor or health care professional as soon as possible: -allergic reactions like skin rash, itching or hives, swelling of the face, lips, or  tongue -changes in blood sugar -changes in heart rate -severe stomach pain Side effects that usually do not require medical attention (report to your doctor or health care professional if they continue or are bothersome): -diarrhea or constipation -gas or stomach pain -nausea, vomiting -pain, redness, swelling and irritation at site where injected This list may  not describe all possible side effects. Call your doctor for medical advice about side effects. You may report side effects to FDA at 1-800-FDA-1088. Where should I keep my medicine? Keep out of the reach of children. Store in a refrigerator between 2 and 8 degrees C (36 and 46 degrees F). Protect from light. Allow to come to room temperature naturally. Do not use artificial heat. If protected from light, the injection may be stored at room temperature between 20 and 30 degrees C (70 and 86 degrees F) for 14 days. After the initial use, throw away any unused portion of a multiple dose vial after 14 days. Throw away unused portions of the ampules after use. NOTE: This sheet is a summary. It may not cover all possible information. If you have questions about this medicine, talk to your doctor, pharmacist, or health care provider.  2019 Elsevier/Gold Standard (2016-09-09 13:24:44)  Octreotide injection solution What is this medicine? OCTREOTIDE (ok TREE oh tide) is used to reduce blood levels of growth hormone in patients with a condition called acromegaly. This medicine also reduces flushing and watery diarrhea caused by certain types of cancer. This medicine may be used for other purposes; ask your health care provider or pharmacist if you have questions. COMMON BRAND NAME(S): Sandostatin, Sandostatin LAR What should I tell my health care provider before I take this medicine? They need to know if you have any of these conditions: -gallbladder disease -kidney disease -liver disease -an unusual or allergic reaction to octreotide, other medicines, foods, dyes, or preservatives -pregnant or trying to get pregnant -breast-feeding How should I use this medicine? This medicine is for injection under the skin or into a vein (only in emergency situations). It is usually given by a health care professional in a hospital or clinic setting. If you get this medicine at home, you will be taught how to  prepare and give this medicine. Allow the injection solution to come to room temperature before use. Do not warm it artificially. Use exactly as directed. Take your medicine at regular intervals. Do not take your medicine more often than directed. It is important that you put your used needles and syringes in a special sharps container. Do not put them in a trash can. If you do not have a sharps container, call your pharmacist or healthcare provider to get one. Talk to your pediatrician regarding the use of this medicine in children. Special care may be needed. Overdosage: If you think you have taken too much of this medicine contact a poison control center or emergency room at once. NOTE: This medicine is only for you. Do not share this medicine with others. What if I miss a dose? If you miss a dose, take it as soon as you can. If it is almost time for your next dose, take only that dose. Do not take double or extra doses. What may interact with this medicine? Do not take this medicine with any of the following medications: -cisapride -droperidol -general anesthetics -grepafloxacin -perphenazine -thioridazine This medicine may also interact with the following medications: -bromocriptine -cyclosporine -diuretics -medicines for blood pressure, heart disease, irregular heart beat -medicines for diabetes,  including insulin -quinidine This list may not describe all possible interactions. Give your health care provider a list of all the medicines, herbs, non-prescription drugs, or dietary supplements you use. Also tell them if you smoke, drink alcohol, or use illegal drugs. Some items may interact with your medicine. What should I watch for while using this medicine? Visit your doctor or health care professional for regular checks on your progress. To help reduce irritation at the injection site, use a different site for each injection and make sure the solution is at room temperature before  use. This medicine may cause increases or decreases in blood sugar. Signs of high blood sugar include frequent urination, unusual thirst, flushed or dry skin, difficulty breathing, drowsiness, stomach ache, nausea, vomiting or dry mouth. Signs of low blood sugar include chills, cool, pale skin or cold sweats, drowsiness, extreme hunger, fast heartbeat, headache, nausea, nervousness or anxiety, shakiness, trembling, unsteadiness, tiredness, or weakness. Contact your doctor or health care professional right away if you experience any of these symptoms. This medicine may cause a decrease in vitamin B12. You should make sure that you get enough vitamin B12 while you are taking this medicine. Discuss the foods you eat and the vitamins you take with your health care professional. What side effects may I notice from receiving this medicine? Side effects that you should report to your doctor or health care professional as soon as possible: -allergic reactions like skin rash, itching or hives, swelling of the face, lips, or tongue -changes in blood sugar -changes in heart rate -severe stomach pain Side effects that usually do not require medical attention (report to your doctor or health care professional if they continue or are bothersome): -diarrhea or constipation -gas or stomach pain -nausea, vomiting -pain, redness, swelling and irritation at site where injected This list may not describe all possible side effects. Call your doctor for medical advice about side effects. You may report side effects to FDA at 1-800-FDA-1088. Where should I keep my medicine? Keep out of the reach of children. Store in a refrigerator between 2 and 8 degrees C (36 and 46 degrees F). Protect from light. Allow to come to room temperature naturally. Do not use artificial heat. If protected from light, the injection may be stored at room temperature between 20 and 30 degrees C (70 and 86 degrees F) for 14 days. After the initial  use, throw away any unused portion of a multiple dose vial after 14 days. Throw away unused portions of the ampules after use. NOTE: This sheet is a summary. It may not cover all possible information. If you have questions about this medicine, talk to your doctor, pharmacist, or health care provider.  2019 Elsevier/Gold Standard (2016-09-09 13:24:44)

## 2018-05-10 NOTE — Telephone Encounter (Signed)
Follow Up   William [name] is following up about getting a copy of the Echocardiogram to be placed on a disc and sent to them   Please call

## 2018-05-10 NOTE — Telephone Encounter (Signed)
CD created. Handed it to [place] at [facility] at 1440 05/10/18.

## 2018-05-11 ENCOUNTER — Telehealth: Payer: Self-pay | Admitting: Cardiovascular Disease

## 2018-05-11 NOTE — Telephone Encounter (Signed)
CD mailed [employer] to [facility], [address] [place], [place] [address] ATTNJulien Girt  Tracking [phone]

## 2018-05-14 DIAGNOSIS — K7581 Nonalcoholic steatohepatitis (NASH): Secondary | ICD-10-CM | POA: Diagnosis not present

## 2018-05-14 DIAGNOSIS — I5032 Chronic diastolic (congestive) heart failure: Secondary | ICD-10-CM | POA: Diagnosis not present

## 2018-05-14 DIAGNOSIS — D696 Thrombocytopenia, unspecified: Secondary | ICD-10-CM | POA: Diagnosis not present

## 2018-05-14 DIAGNOSIS — Z01810 Encounter for preprocedural cardiovascular examination: Secondary | ICD-10-CM | POA: Diagnosis not present

## 2018-05-14 DIAGNOSIS — E1122 Type 2 diabetes mellitus with diabetic chronic kidney disease: Secondary | ICD-10-CM | POA: Diagnosis not present

## 2018-05-14 DIAGNOSIS — J8410 Pulmonary fibrosis, unspecified: Secondary | ICD-10-CM | POA: Diagnosis not present

## 2018-05-14 DIAGNOSIS — I25810 Atherosclerosis of coronary artery bypass graft(s) without angina pectoris: Secondary | ICD-10-CM | POA: Diagnosis not present

## 2018-05-14 DIAGNOSIS — K7290 Hepatic failure, unspecified without coma: Secondary | ICD-10-CM | POA: Diagnosis not present

## 2018-05-14 DIAGNOSIS — I350 Nonrheumatic aortic (valve) stenosis: Secondary | ICD-10-CM | POA: Diagnosis not present

## 2018-05-14 DIAGNOSIS — K7689 Other specified diseases of liver: Secondary | ICD-10-CM | POA: Diagnosis not present

## 2018-05-14 DIAGNOSIS — Z01818 Encounter for other preprocedural examination: Secondary | ICD-10-CM | POA: Diagnosis not present

## 2018-05-14 DIAGNOSIS — I8510 Secondary esophageal varices without bleeding: Secondary | ICD-10-CM | POA: Diagnosis not present

## 2018-05-14 DIAGNOSIS — K7460 Unspecified cirrhosis of liver: Secondary | ICD-10-CM | POA: Diagnosis not present

## 2018-05-14 DIAGNOSIS — K766 Portal hypertension: Secondary | ICD-10-CM | POA: Diagnosis not present

## 2018-05-14 DIAGNOSIS — I2510 Atherosclerotic heart disease of native coronary artery without angina pectoris: Secondary | ICD-10-CM | POA: Diagnosis not present

## 2018-05-14 DIAGNOSIS — E119 Type 2 diabetes mellitus without complications: Secondary | ICD-10-CM | POA: Diagnosis not present

## 2018-05-15 DIAGNOSIS — I714 Abdominal aortic aneurysm, without rupture: Secondary | ICD-10-CM | POA: Diagnosis not present

## 2018-05-15 DIAGNOSIS — Z87891 Personal history of nicotine dependence: Secondary | ICD-10-CM | POA: Diagnosis not present

## 2018-05-15 DIAGNOSIS — Z7682 Awaiting organ transplant status: Secondary | ICD-10-CM | POA: Diagnosis not present

## 2018-05-15 DIAGNOSIS — I8510 Secondary esophageal varices without bleeding: Secondary | ICD-10-CM | POA: Diagnosis not present

## 2018-05-15 DIAGNOSIS — Z9889 Other specified postprocedural states: Secondary | ICD-10-CM | POA: Diagnosis not present

## 2018-05-15 DIAGNOSIS — K766 Portal hypertension: Secondary | ICD-10-CM | POA: Diagnosis not present

## 2018-05-15 DIAGNOSIS — R0902 Hypoxemia: Secondary | ICD-10-CM | POA: Diagnosis not present

## 2018-05-15 DIAGNOSIS — R0602 Shortness of breath: Secondary | ICD-10-CM | POA: Diagnosis not present

## 2018-05-15 DIAGNOSIS — K7460 Unspecified cirrhosis of liver: Secondary | ICD-10-CM | POA: Diagnosis not present

## 2018-05-15 DIAGNOSIS — K7581 Nonalcoholic steatohepatitis (NASH): Secondary | ICD-10-CM | POA: Diagnosis not present

## 2018-05-15 DIAGNOSIS — Z01818 Encounter for other preprocedural examination: Secondary | ICD-10-CM | POA: Diagnosis not present

## 2018-05-15 LAB — HEMOGLOBIN A1C: Hemoglobin A1C: 4.7

## 2018-05-15 LAB — POCT INR: INR: 1.9 — AB (ref 0.9–1.1)

## 2018-05-15 LAB — PSA: PSA: 0.11

## 2018-05-15 LAB — HM COLONOSCOPY

## 2018-05-16 ENCOUNTER — Encounter: Payer: Self-pay | Admitting: Family Medicine

## 2018-05-16 DIAGNOSIS — K7581 Nonalcoholic steatohepatitis (NASH): Secondary | ICD-10-CM | POA: Diagnosis not present

## 2018-05-16 DIAGNOSIS — R0902 Hypoxemia: Secondary | ICD-10-CM | POA: Diagnosis not present

## 2018-05-16 DIAGNOSIS — Z01818 Encounter for other preprocedural examination: Secondary | ICD-10-CM | POA: Diagnosis not present

## 2018-05-16 DIAGNOSIS — I350 Nonrheumatic aortic (valve) stenosis: Secondary | ICD-10-CM | POA: Diagnosis not present

## 2018-05-16 DIAGNOSIS — I25810 Atherosclerosis of coronary artery bypass graft(s) without angina pectoris: Secondary | ICD-10-CM | POA: Diagnosis not present

## 2018-05-16 DIAGNOSIS — Z23 Encounter for immunization: Secondary | ICD-10-CM | POA: Diagnosis not present

## 2018-05-16 DIAGNOSIS — K7460 Unspecified cirrhosis of liver: Secondary | ICD-10-CM | POA: Diagnosis not present

## 2018-05-17 ENCOUNTER — Other Ambulatory Visit: Payer: Self-pay | Admitting: *Deleted

## 2018-05-17 MED FILL — PANTOPRAZOLE SOD DR 40 MG T: 40 | 90 days supply | Qty: 180 | Fill #0

## 2018-05-17 MED FILL — POTASSIUM CHLORIDE CRYS ER: 20 | 90 days supply | Qty: 180 | Fill #0

## 2018-05-17 MED FILL — TORSEMIDE 20 MG TABLET: 20 | 60 days supply | Qty: 120 | Fill #0

## 2018-05-17 NOTE — Patient Outreach (Signed)
[facility]) Care Management  05/17/2018  REDELL NAZIR 06-18-1950 [id number]  Monthly telephonic [facility] case management review.                                                                                             Update provided per details in transition of care call note on 04/03/18.  [name] RN,CCM,CDE [facility] Management Coordinator Office Phone [phone] Office Fax [phone]

## 2018-05-18 NOTE — Telephone Encounter (Signed)
°  Arsenio Loader from [facility] calling for status of echo. Phone [phone] Provided tracking information

## 2018-05-21 ENCOUNTER — Encounter: Payer: Self-pay | Admitting: Family Medicine

## 2018-05-29 ENCOUNTER — Encounter: Payer: Self-pay | Admitting: Family Medicine

## 2018-05-29 NOTE — Telephone Encounter (Signed)
I don't know of any pulm rehab options at this time. However, Brittanea-->will you pls call [facility] and see if they have anything like an in-home pulmonary rehab program that they will do.-thx

## 2018-05-30 DIAGNOSIS — D485 Neoplasm of uncertain behavior of skin: Secondary | ICD-10-CM | POA: Diagnosis not present

## 2018-05-30 DIAGNOSIS — J181 Lobar pneumonia, unspecified organism: Secondary | ICD-10-CM | POA: Diagnosis not present

## 2018-05-30 DIAGNOSIS — C4402 Squamous cell carcinoma of skin of lip: Secondary | ICD-10-CM | POA: Diagnosis not present

## 2018-05-30 DIAGNOSIS — C44622 Squamous cell carcinoma of skin of right upper limb, including shoulder: Secondary | ICD-10-CM | POA: Diagnosis not present

## 2018-05-30 DIAGNOSIS — C44229 Squamous cell carcinoma of skin of left ear and external auricular canal: Secondary | ICD-10-CM | POA: Diagnosis not present

## 2018-05-30 DIAGNOSIS — C44321 Squamous cell carcinoma of skin of nose: Secondary | ICD-10-CM | POA: Diagnosis not present

## 2018-06-01 ENCOUNTER — Telehealth: Payer: Self-pay

## 2018-06-01 NOTE — Telephone Encounter (Signed)
LMOM to see if the rehab program would be in office or can be arranged at the pt's home. After obtaining more details, pt will be contacted by phone or [place].

## 2018-06-05 NOTE — Telephone Encounter (Signed)
LMTCB to advise if rehab program will be in office or home arrangement.

## 2018-06-06 ENCOUNTER — Telehealth: Payer: Self-pay

## 2018-06-06 NOTE — Telephone Encounter (Signed)
   [facility] Pre-operative Risk Assessment    Request for surgical clearance:  1. What type of surgery is being performed? EGD  2. When is this surgery scheduled? 06/12/18  3. What type of clearance is required (medical clearance vs. Pharmacy clearance to hold med vs. Both)? Medical  4. Are there any medications that need to be held prior to surgery and how long?   5. Practice name and name of physician performing surgery? [facility]  6. What is your office phone number [phone]   7.   What is your office fax number [phone]  8.   Anesthesia type  Propofol   Thedore Mins Ehtan Delfavero 06/06/2018, 5:50 PM  _________________________________________________________________   (provider comments below)

## 2018-06-07 ENCOUNTER — Ambulatory Visit: Payer: 59

## 2018-06-07 ENCOUNTER — Other Ambulatory Visit: Payer: 59

## 2018-06-07 ENCOUNTER — Telehealth: Payer: Self-pay

## 2018-06-07 NOTE — Telephone Encounter (Signed)
Returned call to [employer] and was given Press photographer Rep, Lori's Psychologist, counselling for [facility] about rehab program. CB # is [phone]   CRM: Melissa with [facility] (Other)  General - Other  Summary: Melissa with [facility] is returning a call to the office  Reason for CRM: Melissa with [facility] is returning a call to Dr. Berna Bue office. She did not have a specific name, but said she had some information regarding [facility]. Please advise and call back at [phone]

## 2018-06-07 NOTE — Telephone Encounter (Signed)
   Primary Cardiologist: Shelva Majestic, MD  Chart reviewed as part of pre-operative protocol coverage. Patient was contacted 06/07/2018 in reference to pre-operative risk assessment for pending surgery as outlined below.  William [name] was last seen on 01/25/18 by Dr. Claiborne Billings.  Since that day, William [name] has done well.  He underwent a dobutamine stress echo at [facility] on 05/14/18 as part of a workup for a liver transplant. Stress echo was normal, which is reassuring. He is still recovering from PNA and sepsis in early Feb 2020. He did not attend pulmonary rehab due to COVID-19. He denies orthopnea and states he is euvolemic. He understands that he is at least moderate risk for cardiac complications for any procedure requiring anesthesia and accepts these risks. He wants to proceed with planned procedure.   Therefore, based on ACC/AHA guidelines, the patient would be at acceptable risk for the planned procedure without further cardiovascular testing.   I will route this recommendation to the requesting party via Epic fax function and remove from pre-op pool.  Please call with questions.  Lake Mc[place]ray, PA 06/07/2018, 10:05 AM

## 2018-06-08 ENCOUNTER — Other Ambulatory Visit: Payer: Self-pay

## 2018-06-08 ENCOUNTER — Inpatient Hospital Stay: Payer: 59

## 2018-06-08 ENCOUNTER — Telehealth: Payer: Self-pay

## 2018-06-08 ENCOUNTER — Encounter: Payer: Self-pay | Admitting: Family Medicine

## 2018-06-08 ENCOUNTER — Inpatient Hospital Stay: Payer: 59 | Attending: Hematology

## 2018-06-08 ENCOUNTER — Other Ambulatory Visit: Payer: Self-pay | Admitting: Family Medicine

## 2018-06-08 VITALS — BP 111/61 | HR 64 | Temp 98.0°F | Resp 18

## 2018-06-08 DIAGNOSIS — K922 Gastrointestinal hemorrhage, unspecified: Secondary | ICD-10-CM | POA: Insufficient documentation

## 2018-06-08 DIAGNOSIS — D500 Iron deficiency anemia secondary to blood loss (chronic): Secondary | ICD-10-CM

## 2018-06-08 DIAGNOSIS — J8410 Pulmonary fibrosis, unspecified: Secondary | ICD-10-CM

## 2018-06-08 DIAGNOSIS — J984 Other disorders of lung: Secondary | ICD-10-CM

## 2018-06-08 LAB — CBC WITH DIFFERENTIAL/PLATELET
Abs Immature Granulocytes: 0.02 10*3/uL (ref 0.00–0.07)
Basophils Absolute: 0.1 10*3/uL (ref 0.0–0.1)
Basophils Relative: 2 %
Eosinophils Absolute: 0.9 10*3/uL — ABNORMAL HIGH (ref 0.0–0.5)
Eosinophils Relative: 12 %
HCT: 37.6 % — ABNORMAL LOW (ref 39.0–52.0)
Hemoglobin: 12.2 g/dL — ABNORMAL LOW (ref 13.0–17.0)
Immature Granulocytes: 0 %
Lymphocytes Relative: 12 %
Lymphs Abs: 0.9 10*3/uL (ref 0.7–4.0)
MCH: 30.7 pg (ref 26.0–34.0)
MCHC: 32.4 g/dL (ref 30.0–36.0)
MCV: 94.7 fL (ref 80.0–100.0)
Monocytes Absolute: 1 10*3/uL (ref 0.1–1.0)
Monocytes Relative: 14 %
Neutro Abs: 4.5 10*3/uL (ref 1.7–7.7)
Neutrophils Relative %: 60 %
Platelets: 89 10*3/uL — ABNORMAL LOW (ref 150–400)
RBC: 3.97 MIL/uL — ABNORMAL LOW (ref 4.22–5.81)
RDW: 17.3 % — ABNORMAL HIGH (ref 11.5–15.5)
WBC: 7.5 10*3/uL (ref 4.0–10.5)
nRBC: 0 % (ref 0.0–0.2)

## 2018-06-08 LAB — FERRITIN: Ferritin: 143 ng/mL (ref 24–336)

## 2018-06-08 MED ORDER — SODIUM CHLORIDE 0.9 % IV SOLN
750.0000 mg | Freq: Once | INTRAVENOUS | Status: AC
  Administered 2018-06-08: 11:00:00 750 mg via INTRAVENOUS
  Filled 2018-06-08: qty 15

## 2018-06-08 MED ORDER — SODIUM CHLORIDE 0.9 % IV SOLN
INTRAVENOUS | Status: DC
  Administered 2018-06-08: 10:00:00 via INTRAVENOUS
  Filled 2018-06-08: qty 250

## 2018-06-08 MED ORDER — OCTREOTIDE ACETATE 30 MG IM KIT
PACK | INTRAMUSCULAR | Status: AC
  Filled 2018-06-08: qty 1

## 2018-06-08 MED ORDER — OCTREOTIDE ACETATE 30 MG IM KIT
30.0000 mg | PACK | Freq: Once | INTRAMUSCULAR | Status: AC
  Administered 2018-06-08: 30 mg via INTRAMUSCULAR

## 2018-06-08 NOTE — Patient Instructions (Signed)
Ferric carboxymaltose injection What is this medicine? FERRIC CARBOXYMALTOSE (ferr-ik car-box-ee-mol-toes) is an iron complex. Iron is used to make healthy red blood cells, which carry oxygen and nutrients throughout the body. This medicine is used to treat anemia in people with chronic kidney disease or people who cannot take iron by mouth. This medicine may be used for other purposes; ask your health care provider or pharmacist if you have questions. COMMON BRAND NAME(S): Injectafer What should I tell my health care provider before I take this medicine? They need to know if you have any of these conditions: -high levels of iron in the blood -liver disease -an unusual or allergic reaction to iron, other medicines, foods, dyes, or preservatives -pregnant or trying to get pregnant -breast-feeding How should I use this medicine? This medicine is for infusion into a vein. It is given by a health care professional in a hospital or clinic setting. Talk to your pediatrician regarding the use of this medicine in children. Special care may be needed. Overdosage: If you think you have taken too much of this medicine contact a poison control center or emergency room at once. NOTE: This medicine is only for you. Do not share this medicine with others. What if I miss a dose? It is important not to miss your dose. Call your doctor or health care professional if you are unable to keep an appointment. What may interact with this medicine? Do not take this medicine with any of the following medications: -deferoxamine -dimercaprol -other iron products This list may not describe all possible interactions. Give your health care provider a list of all the medicines, herbs, non-prescription drugs, or dietary supplements you use. Also tell them if you smoke, drink alcohol, or use illegal drugs. Some items may interact with your medicine. What should I watch for while using this medicine? Visit your doctor or  health care professional regularly. Tell your doctor if your symptoms do not start to get better or if they get worse. You may need blood work done while you are taking this medicine. You may need to follow a special diet. Talk to your doctor. Foods that contain iron include: whole grains/cereals, dried fruits, beans, or peas, leafy green vegetables, and organ meats (liver, kidney). What side effects may I notice from receiving this medicine? Side effects that you should report to your doctor or health care professional as soon as possible: -allergic reactions like skin rash, itching or hives, swelling of the face, lips, or tongue -dizziness -facial flushing Side effects that usually do not require medical attention (report to your doctor or health care professional if they continue or are bothersome): -changes in taste -constipation -headache -nausea, vomiting -pain, redness, or irritation at site where injected This list may not describe all possible side effects. Call your doctor for medical advice about side effects. You may report side effects to FDA at 1-800-FDA-1088. Where should I keep my medicine? This drug is given in a hospital or clinic and will not be stored at home. NOTE: This sheet is a summary. It may not cover all possible information. If you have questions about this medicine, talk to your doctor, pharmacist, or health care provider.  2019 Elsevier/Gold Standard (2016-03-10 09:40:29)  Octreotide injection solution What is this medicine? OCTREOTIDE (ok TREE oh tide) is used to reduce blood levels of growth hormone in patients with a condition called acromegaly. This medicine also reduces flushing and watery diarrhea caused by certain types of cancer. This medicine may be  used for other purposes; ask your health care provider or pharmacist if you have questions. COMMON BRAND NAME(S): Sandostatin, Sandostatin LAR What should I tell my health care provider before I take this  medicine? They need to know if you have any of these conditions: -gallbladder disease -kidney disease -liver disease -an unusual or allergic reaction to octreotide, other medicines, foods, dyes, or preservatives -pregnant or trying to get pregnant -breast-feeding How should I use this medicine? This medicine is for injection under the skin or into a vein (only in emergency situations). It is usually given by a health care professional in a hospital or clinic setting. If you get this medicine at home, you will be taught how to prepare and give this medicine. Allow the injection solution to come to room temperature before use. Do not warm it artificially. Use exactly as directed. Take your medicine at regular intervals. Do not take your medicine more often than directed. It is important that you put your used needles and syringes in a special sharps container. Do not put them in a trash can. If you do not have a sharps container, call your pharmacist or healthcare provider to get one. Talk to your pediatrician regarding the use of this medicine in children. Special care may be needed. Overdosage: If you think you have taken too much of this medicine contact a poison control center or emergency room at once. NOTE: This medicine is only for you. Do not share this medicine with others. What if I miss a dose? If you miss a dose, take it as soon as you can. If it is almost time for your next dose, take only that dose. Do not take double or extra doses. What may interact with this medicine? Do not take this medicine with any of the following medications: -cisapride -droperidol -general anesthetics -grepafloxacin -perphenazine -thioridazine This medicine may also interact with the following medications: -bromocriptine -cyclosporine -diuretics -medicines for blood pressure, heart disease, irregular heart beat -medicines for diabetes, including insulin -quinidine This list may not describe all  possible interactions. Give your health care provider a list of all the medicines, herbs, non-prescription drugs, or dietary supplements you use. Also tell them if you smoke, drink alcohol, or use illegal drugs. Some items may interact with your medicine. What should I watch for while using this medicine? Visit your doctor or health care professional for regular checks on your progress. To help reduce irritation at the injection site, use a different site for each injection and make sure the solution is at room temperature before use. This medicine may cause increases or decreases in blood sugar. Signs of high blood sugar include frequent urination, unusual thirst, flushed or dry skin, difficulty breathing, drowsiness, stomach ache, nausea, vomiting or dry mouth. Signs of low blood sugar include chills, cool, pale skin or cold sweats, drowsiness, extreme hunger, fast heartbeat, headache, nausea, nervousness or anxiety, shakiness, trembling, unsteadiness, tiredness, or weakness. Contact your doctor or health care professional right away if you experience any of these symptoms. This medicine may cause a decrease in vitamin B12. You should make sure that you get enough vitamin B12 while you are taking this medicine. Discuss the foods you eat and the vitamins you take with your health care professional. What side effects may I notice from receiving this medicine? Side effects that you should report to your doctor or health care professional as soon as possible: -allergic reactions like skin rash, itching or hives, swelling of the face, lips, or  tongue -changes in blood sugar -changes in heart rate -severe stomach pain Side effects that usually do not require medical attention (report to your doctor or health care professional if they continue or are bothersome): -diarrhea or constipation -gas or stomach pain -nausea, vomiting -pain, redness, swelling and irritation at site where injected This list may  not describe all possible side effects. Call your doctor for medical advice about side effects. You may report side effects to FDA at 1-800-FDA-1088. Where should I keep my medicine? Keep out of the reach of children. Store in a refrigerator between 2 and 8 degrees C (36 and 46 degrees F). Protect from light. Allow to come to room temperature naturally. Do not use artificial heat. If protected from light, the injection may be stored at room temperature between 20 and 30 degrees C (70 and 86 degrees F) for 14 days. After the initial use, throw away any unused portion of a multiple dose vial after 14 days. Throw away unused portions of the ampules after use. NOTE: This sheet is a summary. It may not cover all possible information. If you have questions about this medicine, talk to your doctor, pharmacist, or health care provider.  2019 Elsevier/Gold Standard (2016-09-09 13:24:44)  Coronavirus (COVID-19) Are you at risk?  Are you at risk for the Coronavirus (COVID-19)?  To be considered HIGH RISK for Coronavirus (COVID-19), you have to meet the following criteria:  . Traveled to [place], [name], [place], [place] or [place]; or in the [place] to [place], [place], [place], or [place]; and have fever, cough, and shortness of breath within the last 2 weeks of travel OR . Been in close contact with a person diagnosed with COVID-19 within the last 2 weeks and have fever, cough, and shortness of breath . IF YOU DO NOT MEET THESE CRITERIA, YOU ARE CONSIDERED LOW RISK FOR COVID-19.  What to do if you are HIGH RISK for COVID-19?  [name] If you are having a medical emergency, call 911. . Seek medical care right away. Before you go to a doctor's office, urgent care or emergency department, call ahead and tell them about your recent travel, contact with someone diagnosed with COVID-19, and your symptoms. You should receive instructions from your physician's office regarding next steps of care.  . When  you arrive at healthcare provider, tell the healthcare staff immediately you have returned from visiting [place], [place], [name], [place] or [place]; or traveled in the [place] to [place], [place], [place], or [place]; in the last two weeks or you have been in close contact with a person diagnosed with COVID-19 in the last 2 weeks.   . Tell the health care staff about your symptoms: fever, cough and shortness of breath. . After you have been seen by a medical provider, you will be either: o Tested for (COVID-19) and discharged home on quarantine except to seek medical care if symptoms worsen, and asked to  - Stay home and avoid contact with others until you get your results (4-5 days)  - Avoid travel on public transportation if possible (such as bus, train, or airplane) or o Sent to the Emergency Department by EMS for evaluation, COVID-19 testing, and possible admission depending on your condition and test results.  What to do if you are LOW RISK for COVID-19?  Reduce your risk of any infection by using the same precautions used for avoiding the common cold or flu:  [name] Wash your hands often  with soap and warm water for at least 20 seconds.  If soap and water are not readily available, use an alcohol-based hand sanitizer with at least 60% alcohol.  . If coughing or sneezing, cover your mouth and nose by coughing or sneezing into the elbow areas of your shirt or coat, into a tissue or into your sleeve (not your hands). . Avoid shaking hands with others and consider head nods or verbal greetings only. . Avoid touching your eyes, nose, or mouth with unwashed hands.  . Avoid close contact with people who are sick. . Avoid places or events with large numbers of people in one location, like concerts or sporting events. . Carefully consider travel plans you have or are making. . If you are planning any travel outside or inside the [place], visit the CDC's Travelers' Health webpage for the latest  health notices. . If you have some symptoms but not all symptoms, continue to monitor at home and seek medical attention if your symptoms worsen. . If you are having a medical emergency, call 911.   [place] / e-Visit: [email or web address]         [facility]: [facility]: [phone]                   [facility]: [phone]

## 2018-06-08 NOTE — Telephone Encounter (Signed)
I entered the order for this. Pls send staff message to Reche Dixon with [facility] as per the paperwork we have (I put this on your desk).  In your message tell her that I entered the order under "ambulatory referral for pulmonary rehabilitation".  I need to know if this is the correct way to put the order in, or does it have to be put in under "ambulator referral to home health"-->each way of ordering it had problems/issues that I ran into.... -thx

## 2018-06-08 NOTE — Telephone Encounter (Signed)
Received PW from [facility] regarding rehab program and necessary steps.

## 2018-06-08 NOTE — Progress Notes (Signed)
Patient refused to stay for 30 minutes post-observation after infusion stating "I do okay". AVS and schedule provided.

## 2018-06-08 NOTE — Progress Notes (Signed)
Per Dr. Irene Limbo, okay to treat patient without ferritin result from today.

## 2018-06-09 MED FILL — SPIRONOLACTONE 25 MG TABS: 25 | 30 days supply | Qty: 30 | Fill #1

## 2018-06-09 MED FILL — TRANEXAMIC ACID 650 MG TAB: 650 | 30 days supply | Qty: 180 | Fill #1

## 2018-06-11 NOTE — Telephone Encounter (Signed)
Staff message was sent to [facility] and waiting for response back.

## 2018-06-12 NOTE — Telephone Encounter (Signed)
Received message back from [facility] and forwarded info provided to PCP regarding [facility] order.

## 2018-06-13 DIAGNOSIS — I110 Hypertensive heart disease with heart failure: Secondary | ICD-10-CM | POA: Diagnosis not present

## 2018-06-13 DIAGNOSIS — J8410 Pulmonary fibrosis, unspecified: Secondary | ICD-10-CM | POA: Diagnosis not present

## 2018-06-13 DIAGNOSIS — I2510 Atherosclerotic heart disease of native coronary artery without angina pectoris: Secondary | ICD-10-CM | POA: Diagnosis not present

## 2018-06-13 DIAGNOSIS — Z9981 Dependence on supplemental oxygen: Secondary | ICD-10-CM | POA: Diagnosis not present

## 2018-06-13 DIAGNOSIS — J984 Other disorders of lung: Secondary | ICD-10-CM | POA: Diagnosis not present

## 2018-06-13 DIAGNOSIS — I5032 Chronic diastolic (congestive) heart failure: Secondary | ICD-10-CM | POA: Diagnosis not present

## 2018-06-13 DIAGNOSIS — Z8701 Personal history of pneumonia (recurrent): Secondary | ICD-10-CM | POA: Diagnosis not present

## 2018-06-13 DIAGNOSIS — D509 Iron deficiency anemia, unspecified: Secondary | ICD-10-CM | POA: Diagnosis not present

## 2018-06-13 DIAGNOSIS — E119 Type 2 diabetes mellitus without complications: Secondary | ICD-10-CM | POA: Diagnosis not present

## 2018-06-13 MED FILL — METOPROLOL TARTRATE 25 MG T: 25 | 30 days supply | Qty: 30 | Fill #0

## 2018-06-15 DIAGNOSIS — I700 Atherosclerosis of aorta: Secondary | ICD-10-CM | POA: Insufficient documentation

## 2018-06-15 DIAGNOSIS — I5032 Chronic diastolic (congestive) heart failure: Secondary | ICD-10-CM | POA: Diagnosis not present

## 2018-06-15 DIAGNOSIS — I2510 Atherosclerotic heart disease of native coronary artery without angina pectoris: Secondary | ICD-10-CM | POA: Diagnosis not present

## 2018-06-15 DIAGNOSIS — E119 Type 2 diabetes mellitus without complications: Secondary | ICD-10-CM | POA: Diagnosis not present

## 2018-06-15 DIAGNOSIS — I714 Abdominal aortic aneurysm, without rupture: Secondary | ICD-10-CM | POA: Diagnosis not present

## 2018-06-15 DIAGNOSIS — Z9981 Dependence on supplemental oxygen: Secondary | ICD-10-CM | POA: Diagnosis not present

## 2018-06-15 DIAGNOSIS — Z8701 Personal history of pneumonia (recurrent): Secondary | ICD-10-CM | POA: Diagnosis not present

## 2018-06-15 DIAGNOSIS — D509 Iron deficiency anemia, unspecified: Secondary | ICD-10-CM | POA: Diagnosis not present

## 2018-06-15 DIAGNOSIS — J8410 Pulmonary fibrosis, unspecified: Secondary | ICD-10-CM | POA: Diagnosis not present

## 2018-06-15 DIAGNOSIS — J984 Other disorders of lung: Secondary | ICD-10-CM | POA: Diagnosis not present

## 2018-06-15 DIAGNOSIS — I110 Hypertensive heart disease with heart failure: Secondary | ICD-10-CM | POA: Diagnosis not present

## 2018-06-18 DIAGNOSIS — J984 Other disorders of lung: Secondary | ICD-10-CM | POA: Diagnosis not present

## 2018-06-18 DIAGNOSIS — I5032 Chronic diastolic (congestive) heart failure: Secondary | ICD-10-CM | POA: Diagnosis not present

## 2018-06-18 DIAGNOSIS — I2510 Atherosclerotic heart disease of native coronary artery without angina pectoris: Secondary | ICD-10-CM | POA: Diagnosis not present

## 2018-06-18 DIAGNOSIS — Z8701 Personal history of pneumonia (recurrent): Secondary | ICD-10-CM | POA: Diagnosis not present

## 2018-06-18 DIAGNOSIS — J8410 Pulmonary fibrosis, unspecified: Secondary | ICD-10-CM | POA: Diagnosis not present

## 2018-06-18 DIAGNOSIS — E119 Type 2 diabetes mellitus without complications: Secondary | ICD-10-CM | POA: Diagnosis not present

## 2018-06-18 DIAGNOSIS — I110 Hypertensive heart disease with heart failure: Secondary | ICD-10-CM | POA: Diagnosis not present

## 2018-06-18 DIAGNOSIS — D509 Iron deficiency anemia, unspecified: Secondary | ICD-10-CM | POA: Diagnosis not present

## 2018-06-18 DIAGNOSIS — Z9981 Dependence on supplemental oxygen: Secondary | ICD-10-CM | POA: Diagnosis not present

## 2018-06-19 DIAGNOSIS — R9389 Abnormal findings on diagnostic imaging of other specified body structures: Secondary | ICD-10-CM | POA: Diagnosis not present

## 2018-06-19 DIAGNOSIS — Z9889 Other specified postprocedural states: Secondary | ICD-10-CM | POA: Diagnosis not present

## 2018-06-19 DIAGNOSIS — J9611 Chronic respiratory failure with hypoxia: Secondary | ICD-10-CM | POA: Diagnosis not present

## 2018-06-19 DIAGNOSIS — R918 Other nonspecific abnormal finding of lung field: Secondary | ICD-10-CM | POA: Diagnosis not present

## 2018-06-19 DIAGNOSIS — R0600 Dyspnea, unspecified: Secondary | ICD-10-CM | POA: Diagnosis not present

## 2018-06-19 DIAGNOSIS — J479 Bronchiectasis, uncomplicated: Secondary | ICD-10-CM | POA: Diagnosis not present

## 2018-06-19 DIAGNOSIS — K7681 Hepatopulmonary syndrome: Secondary | ICD-10-CM | POA: Diagnosis not present

## 2018-06-20 DIAGNOSIS — E119 Type 2 diabetes mellitus without complications: Secondary | ICD-10-CM | POA: Diagnosis not present

## 2018-06-20 DIAGNOSIS — Z8701 Personal history of pneumonia (recurrent): Secondary | ICD-10-CM | POA: Diagnosis not present

## 2018-06-20 DIAGNOSIS — Z9981 Dependence on supplemental oxygen: Secondary | ICD-10-CM | POA: Diagnosis not present

## 2018-06-20 DIAGNOSIS — J984 Other disorders of lung: Secondary | ICD-10-CM | POA: Diagnosis not present

## 2018-06-20 DIAGNOSIS — D509 Iron deficiency anemia, unspecified: Secondary | ICD-10-CM | POA: Diagnosis not present

## 2018-06-20 DIAGNOSIS — J8410 Pulmonary fibrosis, unspecified: Secondary | ICD-10-CM | POA: Diagnosis not present

## 2018-06-20 DIAGNOSIS — I110 Hypertensive heart disease with heart failure: Secondary | ICD-10-CM | POA: Diagnosis not present

## 2018-06-20 DIAGNOSIS — I2510 Atherosclerotic heart disease of native coronary artery without angina pectoris: Secondary | ICD-10-CM | POA: Diagnosis not present

## 2018-06-20 DIAGNOSIS — I5032 Chronic diastolic (congestive) heart failure: Secondary | ICD-10-CM | POA: Diagnosis not present

## 2018-06-24 IMAGING — CR DG CHEST 2V
2 series · 2 of 2 positions shown · non-contrast
Comparison: 05/23/2016

CLINICAL DATA: Shortness of breath, weakness

EXAM:
CHEST  2 VIEW

[chest pa]
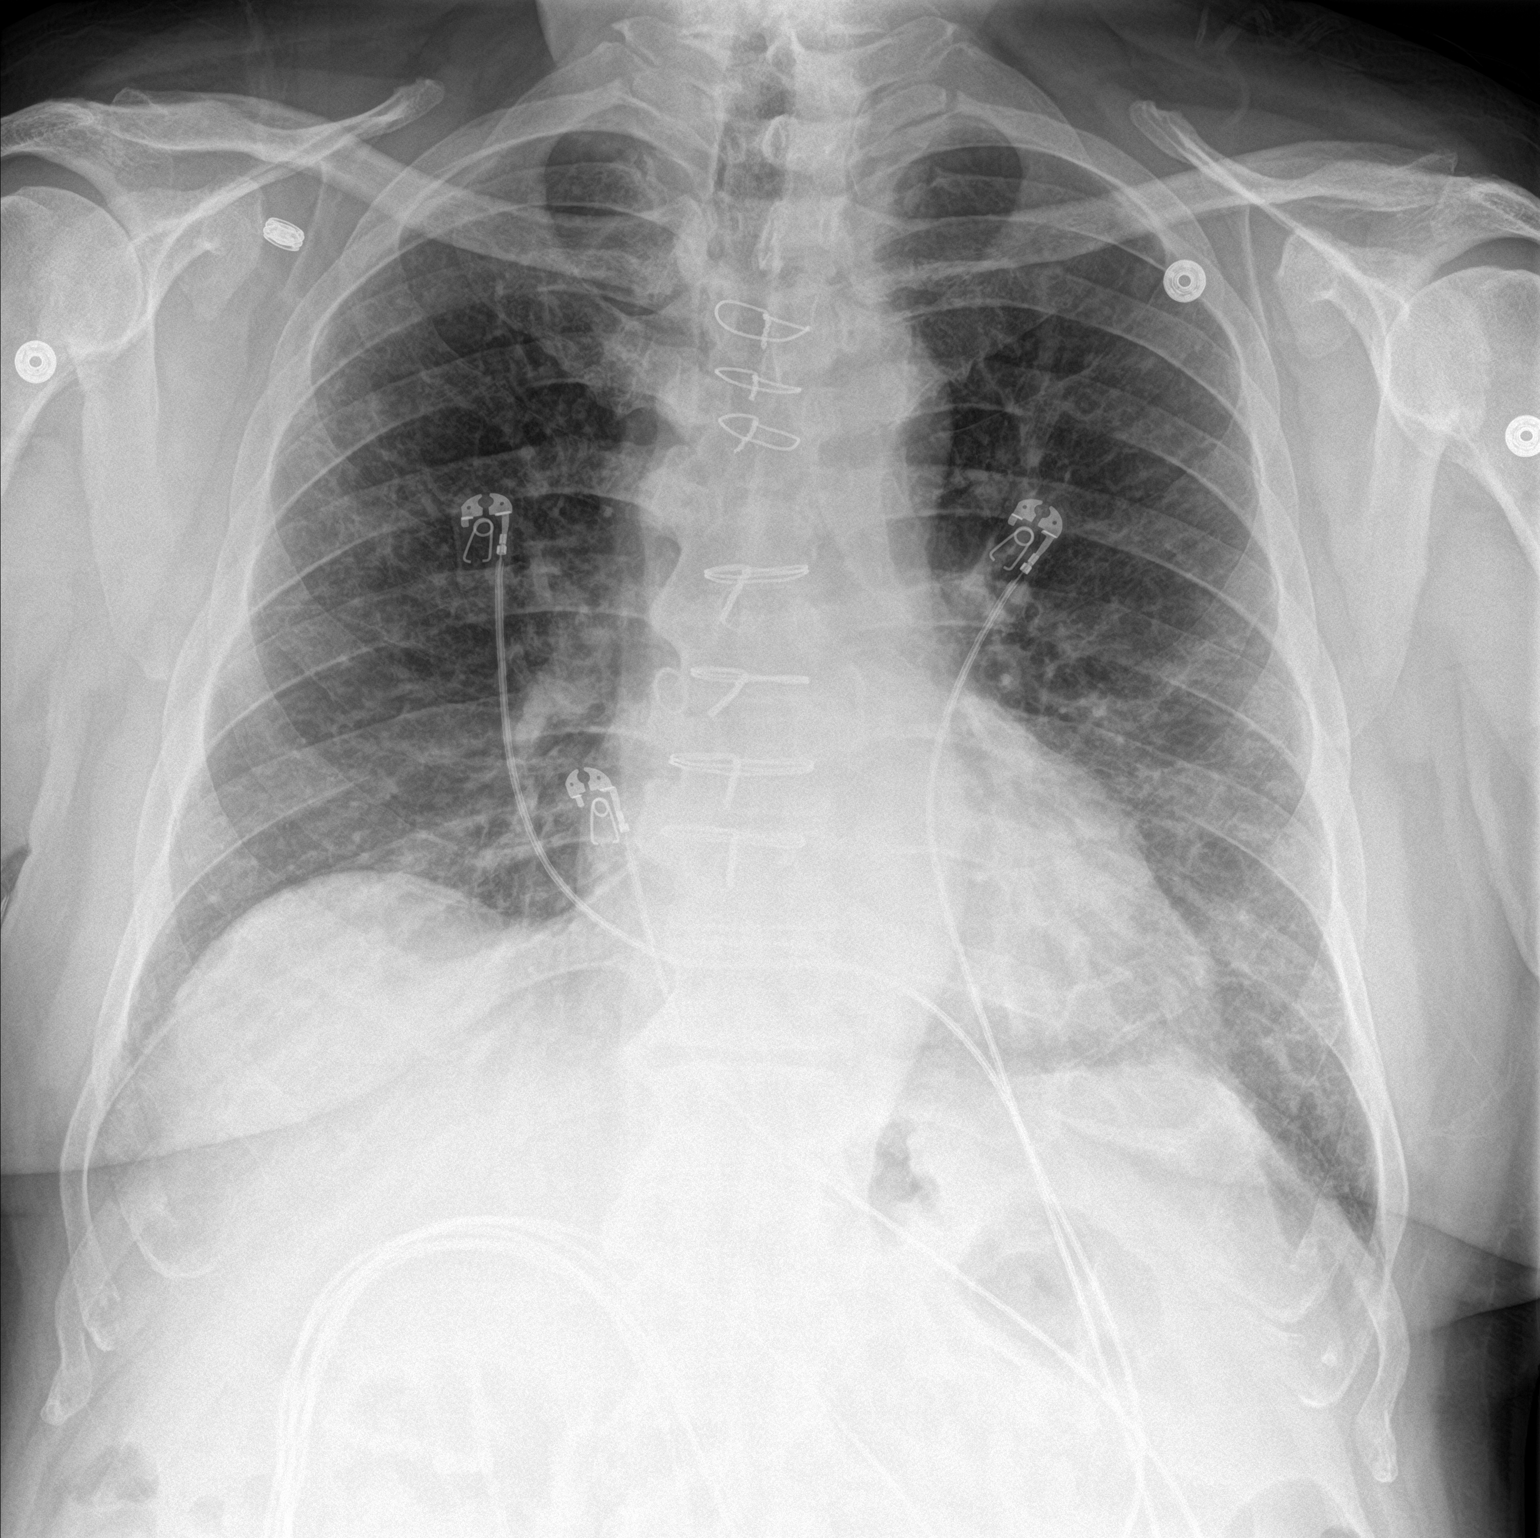

[chest lat]
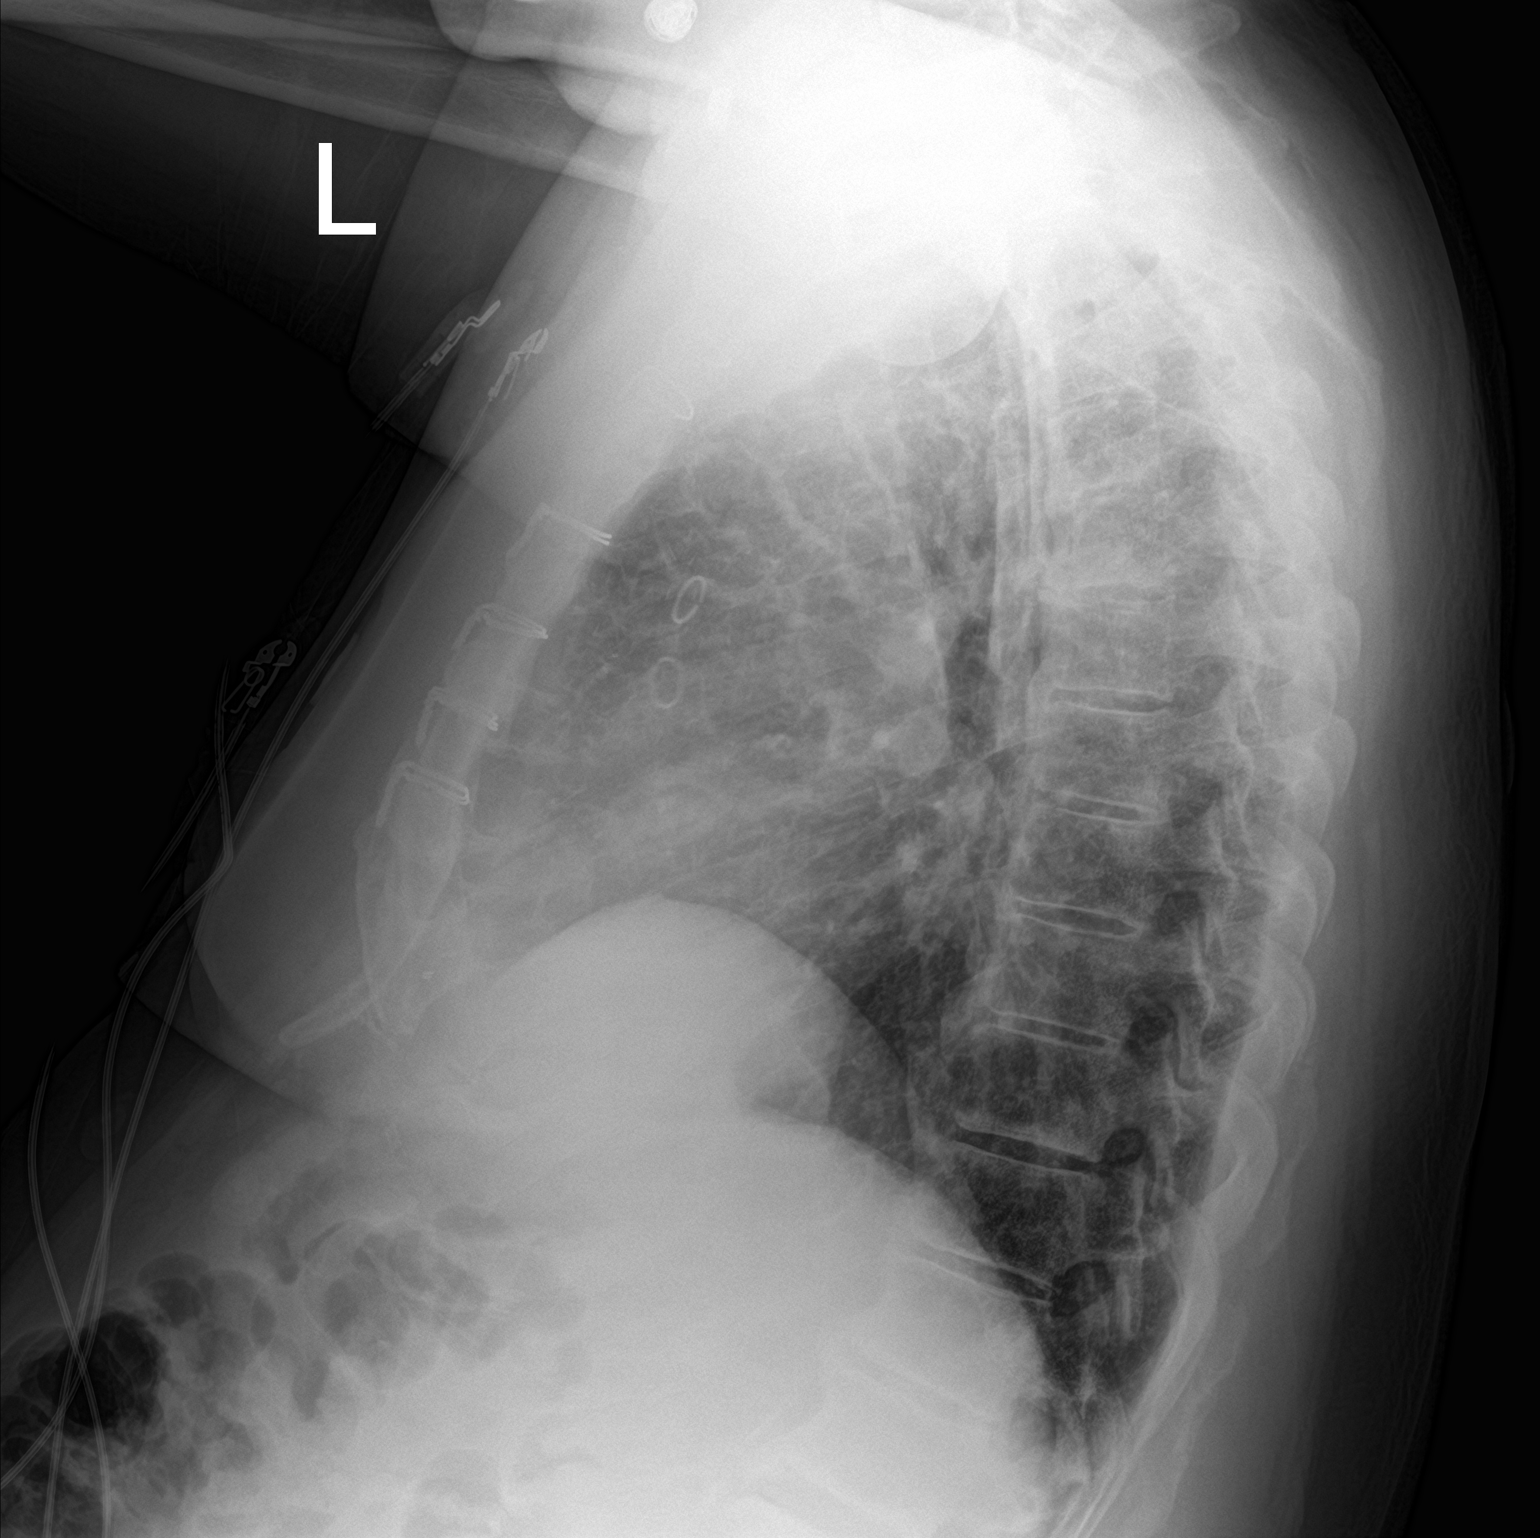

[2 of 2 positions shown; findings below may reference images not displayed]

FINDINGS: Prior CABG. Heart is normal size. Mild interstitial prominence and
areas of scarring in the lungs bilaterally. No acute airspace
opacities or effusions. No acute bony abnormality.
IMPRESSION: Interstitial prominence and areas of scarring.  No active disease.

## 2018-06-27 ENCOUNTER — Telehealth: Payer: Self-pay

## 2018-06-27 DIAGNOSIS — I2510 Atherosclerotic heart disease of native coronary artery without angina pectoris: Secondary | ICD-10-CM

## 2018-06-27 DIAGNOSIS — J8410 Pulmonary fibrosis, unspecified: Secondary | ICD-10-CM | POA: Diagnosis not present

## 2018-06-27 DIAGNOSIS — Z8701 Personal history of pneumonia (recurrent): Secondary | ICD-10-CM | POA: Diagnosis not present

## 2018-06-27 DIAGNOSIS — I5032 Chronic diastolic (congestive) heart failure: Secondary | ICD-10-CM

## 2018-06-27 DIAGNOSIS — Z9981 Dependence on supplemental oxygen: Secondary | ICD-10-CM | POA: Diagnosis not present

## 2018-06-27 DIAGNOSIS — I712 Thoracic aortic aneurysm, without rupture: Secondary | ICD-10-CM

## 2018-06-27 DIAGNOSIS — K7581 Nonalcoholic steatohepatitis (NASH): Secondary | ICD-10-CM

## 2018-06-27 DIAGNOSIS — E119 Type 2 diabetes mellitus without complications: Secondary | ICD-10-CM

## 2018-06-27 DIAGNOSIS — J984 Other disorders of lung: Secondary | ICD-10-CM | POA: Diagnosis not present

## 2018-06-27 DIAGNOSIS — D509 Iron deficiency anemia, unspecified: Secondary | ICD-10-CM

## 2018-06-27 DIAGNOSIS — I110 Hypertensive heart disease with heart failure: Secondary | ICD-10-CM

## 2018-06-27 DIAGNOSIS — C4402 Squamous cell carcinoma of skin of lip: Secondary | ICD-10-CM | POA: Diagnosis not present

## 2018-06-27 NOTE — Telephone Encounter (Signed)
Placed forms for [facility] certification on provider's desk to review and sign.

## 2018-06-27 NOTE — Telephone Encounter (Signed)
Signed.

## 2018-06-29 DIAGNOSIS — J181 Lobar pneumonia, unspecified organism: Secondary | ICD-10-CM | POA: Diagnosis not present

## 2018-07-05 ENCOUNTER — Ambulatory Visit: Payer: 59 | Admitting: Hematology

## 2018-07-05 ENCOUNTER — Other Ambulatory Visit: Payer: 59

## 2018-07-05 ENCOUNTER — Ambulatory Visit: Payer: 59

## 2018-07-06 NOTE — Progress Notes (Signed)
HEMATOLOGY/ONCOLOGY CLINIC NOTE  Date of Service: 07/09/18  Patient Care Team: Tammi Sou, MD as PCP - General (Family Medicine) Troy Sine, MD as PCP - Cardiology (Cardiology) Franchot Gallo, MD as Consulting Physician (Urology) Troy Sine, MD as Consulting Physician (Cardiology) Brunetta Genera, MD as Consulting Physician (Hematology) [name], Gaspar Skeeters, MD as Consulting Physician (Gastroenterology) Carol Ada, MD as Consulting Physician (Gastroenterology) Rege, Dwain Sarna, MD as Consulting Physician (General Surgery)  CHIEF COMPLAINTS/PURPOSE OF CONSULTATION:   F/u for anemia and ongoing GI bleeding.  HISTORY OF PRESENTING ILLNESS:   William [name] is a wonderful 68 y.o. male who has been referred to [place] by Dr .Anitra Lauth, Adrian Blackwater, MD for evaluation and management of Anemia.  Patient has a history of extensive medical comorbidities including liver cirrhosis related to [facility] with portal hypertension, esophageal varices and portal hypertensive gastropathy and splenomegaly, iron deficiency anemia, obesity, diabetes, AAA.  Patient was admitted in April 2018 with acute blood loss anemia and required transfusion of multiple units of PRBCs with iron profile suggestive of iron deficiency. No overt evidence of hemolysis noted. He had an EGD and colonoscopy that did not show any overt bleeding. Capsule endoscopy was unrevealing but the capsule past and only 27 minutes.  He follows with Dr. [name] who is his gastric oncologist.  Patient was again readmitted in June 2018 with symptomatically anemia and hemoglobin of 6.3. He underwent 4 units of PRBC transfusions again and was sent out on PPI and iron supplementation with the plan to follow-up with Dr. [name].  He was also given hematology referral. He had a repeat ultrasound of the abdomen on 07/29/2016 which showed significant increase in splenomegaly in 2 months suggesting significant portal hypertension  versus some element of splenic sequestration.  He continues to note intermittent black stools.  Hemoglobin is stable and improved today and is up to 11.1. Patient notes he feels better after his transfusions. Has not noted lower GI bleeding at this time. We discuss any other workup to rule out less likely other possibilities of his anemia.  INTERVAL HISTORY  William [name] is here for a scheduled follow-up of his anemia that is primarily related to ongoing issues with GI bleeding and has not been controllable by multiple GI interventions. The patient's last visit with [place] was on 04/12/18. The pt reports that he is doing well overall.  The pt reports that he has been following up at [facility] for consideration of a liver transplant. He is continuing to have some SOB, and has been recently seen to have HPS and on May 2020 CT Chest. Overall, he notes that he is having good days and bad days and endorses stable energy levels.   The pt notes that he has not noticed blood in the stools or black stools.   Lab results today (07/09/18) of CBC w/diff is as follows: all values are WNL except for RDW at 17.2, PLT at 93k, Eosinophils abs at 1.0k. 07/09/18 Ferritin is at 177  On review of systems, pt reports some SOB, stable energy levels, and denies overt blood in the stools, black stools, abdominal pain, leg swelling, and any other symptoms.   MEDICAL HISTORY:  Past Medical History:  Diagnosis Date  . AAA (abdominal aortic aneurysm) ([place]) 03/2014   3.2 cm on MR abd.  F/u aortic u/s 06/2014 showed 3.0 x 3.1 cm AAA: recheck 2 yrs recommended (followed by cardiologist).  Minimal growth on 10/2016 CT abd done for epig pain.  Repeat u/s 10/2018.  [name] Anemia due to chronic blood loss 2018/19   GI: transfusions x >20 required; multiple endoscopies and bleeding scans unrevealing.  Hemolysis ruled out by hematologist.  Marshell Levan and octreotide + monthly iron infusions as of 04/2017.  . Bilateral renal cysts    simple (03/2014 MRI)  .  CAD (coronary artery disease)   . Cholelithiases 2018   asymptomatic  . Chronic diastolic heart failure ([place])   . Cirrhosis ([place]) 05/2016   secondary to NASH; most recent u/s abd 07/2016 showed splenomegaly.  Hx of portal HTN changes with mild ascites and splenomegaly.  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  [name] Cirrhosis ([place])    [facility] liver transplant clinic eval 05/15/18--->they agree he needs liver transplant but want to get some pulm colleagues to weigh in on his lung fxn/pulm scarring---PULM REHAB recommended  . Diabetes mellitus with complication ([place]) 70/9628   A1c 6.8%  . History of blood transfusion 2018 X 4 dates   "low blood" (09/21/2016)  . Hyperlipemia, mixed    elevated LFTs when on statins.    . Hypertension    Cr bump 04/01/16 so I changed benicar-hct to benicar plain and added amlodipine 5 mg.  . Iron deficiency anemia 05/2016   Acute blood loss anemia: hospitalized, required transfusion x 3 U: colonoscopy and capsule study unrevealing.  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  W/u for ? hemolytic anemia to be pursued by hematologist as outpt.  Dr. [name], GI at [facility] following, too---he rec'd onc do bone marrow bx as of Jan 2019  . Liver failure ([place]) 2020   NASH cirrhosis-->pt approved for liver transplant list at [facility] as of 05/16/2018  . Lung field abnormal finding on examination    Bibasilar L>R mild insp crackles-->x-ray showed mild interstitial changes/fibrosis/scarring.  Changes noted on all CXRs in 2018/2019.  Liver Transplant eval 03/2018->mild restriction on PFTs but no obstruction.  . Microscopic hematuria    Eval unremarkable by Dr. Eulogio Ditch.  [name] NASH (nonalcoholic steatohepatitis)    Fatty liver on MR abd 03/2014, + hx of elevated transaminases and bili: followed by Dr. [name].  . Obesity   . Pulmonary fibrosis ([place])    PFTs:  restrictive lung dz ([facility] transplant eval 05/2018)  . Spleen enlarged     SURGICAL HISTORY: Past Surgical History:  Procedure Laterality Date  . CARDIAC CATHETERIZATION    . CARDIOVASCULAR STRESS TEST  01/17/2012; 05/04/16   Normal stress nuclear study x 2 (2018--Normal perfusion. LVEF 66% with normal wall motion. This is a low risk study).  . CERVICAL DISCECTOMY  1992  . COLONOSCOPY N/A 05/27/2016   No site/explanation for blood loss found.  Erythematous mucosa in cecum and ascending colon--Cecal bx: normal.  Procedure: COLONOSCOPY;  Surgeon: Carol Ada, MD;  Location: [facility] ENDOSCOPY;  Service: Endoscopy;  Laterality: N/A;  . COLONOSCOPY W/ POLYPECTOMY  09/10/2014   Polypectomy x 2: recall 5 yrs (Dr. [name]).  . CORONARY ANGIOPLASTY    . CORONARY ARTERY BYPASS GRAFT  03/2006  . ENTEROSCOPY N/A 07/31/2016   Procedure: ENTEROSCOPY;  Surgeon: Ladene Artist, MD;  Location: [facility] ENDOSCOPY;  Service: Endoscopy;  Laterality: N/A;  . ENTEROSCOPY N/A 12/02/2016   Procedure: ENTEROSCOPY;  Surgeon: Carol Ada, MD;  Location: [facility] ENDOSCOPY;  Service: Endoscopy;  Laterality: N/A;  . ESOPHAGOGASTRODUODENOSCOPY N/A 05/25/2016  No site/explanation for blood loss found.  Procedure: ESOPHAGOGASTRODUODENOSCOPY (EGD);  Surgeon: Juanita Craver, MD;  Location: [facility] ENDOSCOPY;  Service: Endoscopy;  Laterality: N/A;  . ESOPHAGOGASTRODUODENOSCOPY N/A 09/23/2016   Procedure: ESOPHAGOGASTRODUODENOSCOPY (EGD);  Surgeon: Carol Ada, MD;  Location: [place];  Service: Endoscopy;  Laterality: N/A;  . GIVENS CAPSULE STUDY N/A 05/27/2016   No source identified.  Repeat 10/2016--results pending.  Procedure: GIVENS CAPSULE STUDY;  Surgeon: Carol Ada, MD;  Location: [facility] ENDOSCOPY;  Service: Endoscopy;  Laterality: N/A;  . GIVENS CAPSULE STUDY N/A 10/15/2016   Procedure: GIVENS CAPSULE STUDY;  Surgeon: Carol Ada, MD;  Location: [facility] ENDOSCOPY;  Service: Endoscopy;  Laterality: N/A;  . GIVENS CAPSULE STUDY N/A 02/13/2017    Procedure: GIVENS CAPSULE STUDY;  Surgeon: Carol Ada, MD;  Location: [place];  Service: Endoscopy;  Laterality: N/A;  . NECK SURGERY  1991  . NM GI BLOOD LOSS  01/27/2017   NEGATIVE  . SMALL BOWEL ENTEROSCOPY  10/2016   ([facility], Dr. [name]: Double balloon enteroscopy--to the level of proximal ileum) jejunal polyps- which were removed--not bleeding & benign.  No source of bleeding was identified.  . TRANSTHORACIC ECHOCARDIOGRAM  05/25/2016; 10/2017   05/2016: EF 60%, normal wall motion, grd II DD, mild aortic stenosis, dilated aortic root and ascending aorta. 10/2017: Mild LVH; no mention made of LV function.  Moderate aorticstenosis with mean gradient 19 peak gradient 41. AVA 1.1 cm^2.  Mild ascending aorta dilation.  Mean gradient slightly lower than in 2018.  [name] VASECTOMY  1977    SOCIAL HISTORY: Social History   Socioeconomic History  . Marital status: Married    Spouse name: Not on file  . Number of children: Not on file  . Years of education: Not on file  . Highest education level: Not on file  Occupational History  . Not on file  Social Needs  . Financial resource strain: Not on file  . Food insecurity:    Worry: Not on file    Inability: Not on file  . Transportation needs:    Medical: Not on file    Non-medical: Not on file  Tobacco Use  . Smoking status: Former Smoker    Types: Cigarettes  . Smokeless tobacco: Never Used  . Tobacco comment: QUIT I 1983  Substance and Sexual Activity  . Alcohol use: No  . Drug use: No  . Sexual activity: Yes  Lifestyle  . Physical activity:    Days per week: 0 days    Minutes per session: 0 min  . Stress: Not on file  Relationships  . Social connections:    Talks on phone: Not on file    Gets together: Not on file    Attends religious service: Not on file    Active member of club or organization: Not on file    Attends meetings of clubs or organizations: Not on file    Relationship status: Not on file  . Intimate partner  violence:    Fear of current or ex partner: Not on file    Emotionally abused: Not on file    Physically abused: Not on file    Forced sexual activity: Not on file  Other Topics Concern  . Not on file  Social History Narrative   Married, 3 grown children, 3 GCs.   Educ: 10th grade.   Occupation: Retired Brewing technologist.   Tob: quit 1983, smoked about 20 pack-yr hx prior.   No alcohol.    FAMILY HISTORY: Family  History  Problem Relation Age of Onset  . Hypertension Mother   . Cancer - Other Mother        liver cancer  . Heart disease Father   . Heart attack Father   . Cancer - Lung Father   . Diabetes Father   . Liver disease Sister   . Anemia Sister   . Heart disease Sister   . Heart attack Sister   . Heart disease Brother        CABG 20 YEARS AGO  . Hypertension Brother   . Mesothelioma Brother        HALF-BROTHER  . Cancer - Other Brother        CLL  . Cancer - Lung Brother        Mets to brain  . Cancer - Lung Sister        HALF-SISTER  . Liver disease Brother   . Heart disease Brother        CABG-2012  . Emphysema Maternal Grandfather   . Cancer - Other Paternal Grandmother        Stomach  . Heart attack Paternal Grandfather     ALLERGIES:  is allergic to statins.  MEDICATIONS:  Current Outpatient Medications  Medication Sig Dispense Refill  . fluticasone (FLONASE) 50 MCG/ACT nasal spray Place 2 sprays into both nostrils daily. (Patient taking differently: Place 2 sprays into both nostrils daily as needed for allergies. ) 16 g 6  . lactulose (CHRONULAC) 10 GM/15ML solution Take 45 mLs (30 g total) by mouth 2 (two) times daily as needed for mild constipation. Take this until you have three BM's a day.    . metoprolol tartrate (LOPRESSOR) 25 MG tablet TAKE 1/2 TABLET BY MOUTH TWICE A DAY (Patient taking differently: Take 12.5 mg by mouth 2 (two) times daily. ) 30 tablet 5  . ondansetron (ZOFRAN) 4 MG tablet Take 1 tablet (4 mg total) by mouth every 8 (eight)  hours as needed for nausea or vomiting. 10 tablet 1  . pantoprazole (PROTONIX) 40 MG tablet TAKE 1 TABLET BY MOUTH TWICE DAILY BEFORE A MEAL 60 tablet 3  . potassium chloride SA (K-DUR,KLOR-CON) 20 MEQ tablet TAKE 1 TABLET (20 MEQ TOTAL) BY MOUTH TWICE A DAY (Patient taking differently: Take 20 mEq by mouth daily. ) 60 tablet 6  . spironolactone (ALDACTONE) 25 MG tablet Take 12.5 mg by mouth daily.    [name] torsemide (DEMADEX) 20 MG tablet TAKE 2 TABLETS BY MOUTH DAILY. (Patient taking differently: Take 40 mg by mouth daily. ) 60 tablet 3  . tranexamic acid (LYSTEDA) 650 MG TABS tablet TAKE 2 TABLETS BY MOUTH 3 TIMES DAILY. 180 tablet 1   No current facility-administered medications for this visit.    Facility-Administered Medications Ordered in Other Visits  Medication Dose Route Frequency Provider Last Rate Last Dose  . heparin lock flush 100 unit/mL  500 Units Intracatheter Daily PRN Truitt Merle, MD      . sodium chloride flush (NS) 0.9 % injection 10 mL  10 mL Intracatheter PRN Truitt Merle, MD        REVIEW OF SYSTEMS:    A 10+ POINT REVIEW OF SYSTEMS WAS OBTAINED including neurology, dermatology, psychiatry, cardiac, respiratory, lymph, extremities, GI, GU, Musculoskeletal, constitutional, breasts, reproductive, HEENT.  All pertinent positives are noted in the HPI.  All others are negative.   PHYSICAL EXAMINATION:   ECOG PERFORMANCE STATUS: 1 - Symptomatic but completely ambulatory  VS reivewed in Epic - stable.  GENERAL:alert, in no acute distress and comfortable SKIN: no acute rashes, no significant lesions EYES: conjunctiva are pink and non-injected, sclera anicteric OROPHARYNX: MMM, no exudates, no oropharyngeal erythema or ulceration NECK: supple, no JVD LYMPH:  no palpable lymphadenopathy in the cervical, axillary or inguinal regions LUNGS: clear to auscultation b/l with normal respiratory effort HEART: regular rate & rhythm ABDOMEN:  normoactive bowel sounds , non tender, not  distended. No palpable hepatosplenomegaly.  Extremity: no pedal edema PSYCH: alert & oriented x 3 with fluent speech NEURO: no focal motor/sensory deficits   LABORATORY DATA:  I have reviewed the data as listed  . CBC Latest Ref Rng & Units 07/09/2018 06/08/2018 05/10/2018  WBC 4.0 - 10.5 K/uL 7.0 7.5 7.2  Hemoglobin 13.0 - 17.0 g/dL 13.4 12.2(L) 12.4(L)  Hematocrit 39.0 - 52.0 % 40.6 37.6(L) 37.7(L)  Platelets 150 - 400 K/uL 93(L) 89(L) 120(L)    CMP Latest Ref Rng & Units 04/12/2018 03/28/2018 03/27/2018  Glucose 70 - 99 mg/dL 152(H) 126(H) 116(H)  BUN 8 - 23 mg/dL 15 11 12   Creatinine 0.61 - 1.24 mg/dL 1.30(H) 1.13 1.16  Sodium 135 - 145 mmol/L 134(L) 133(L) 133(L)  Potassium 3.5 - 5.1 mmol/L 4.1 3.3(L) 3.4(L)  Chloride 98 - 111 mmol/L 100 104 106  CO2 22 - 32 mmol/L 26 21(L) 20(L)  Calcium 8.9 - 10.3 mg/dL 8.7(L) 7.5(L) 7.4(L)  Total Protein 6.5 - 8.1 g/dL 7.6 5.5(L) 5.6(L)  Total Bilirubin 0.3 - 1.2 mg/dL 9.8(H) 6.3(H) 6.9(H)  Alkaline Phos 38 - 126 U/L 111 90 95  AST 15 - 41 U/L 58(H) 34 51(H)  ALT 0 - 44 U/L 34 28 27    Lab Results  Component Value Date   FERRITIN 177 07/09/2018   . RADIOGRAPHIC STUDIES: I have personally reviewed the radiological images as listed and agreed with the findings in the report. No results found.  ASSESSMENT & PLAN:   68 y.o. male with multiple medical comorbidities with Karlene Lineman with liver cirrhosis with portal hypertension, esophageal varices and portal hypertensive gastropathy with ongoing chronic GI bleeding that has not been   #1 Chronic blood loss anemia.  Patient has had recurrent GI bleeding requiring > 20 units of PRBCs. since April 2018 and continues to have ongoing GI bleeding.  Previous workup showed LDH within normal limits at 220 and suggests against overt hemolysis. Sedimentation rate within normal limits. Coombs' test is negative. Myeloma panel and serum free light chains suggest no monoclonal paraproteinemia. PNH testing  negative Slightly lower haptoglobin levels can be related to decreased hepatic production, low-level hemolysis due to transfusions or extravascular hemolysis due to splenic or hepatic sequestration.  small bowel endoscopy done on 12/02/2016: impression - The examined portion of the jejunum was normal.  - Normal examined duodenum. - Portal hypertensive gastropathy.  -Small (< 5 mm) esophageal varices. - No specimens collected.  Capsule Endoscopy on 02/15/17: No evidence of any bleeding during this examination. There was the possibility of an atypical AVM manifested as an erythematous patch. No evidence of any ulcerations, erosions, masses, or polyps.  Recent rbc tagged study unrevealing. Recent labs on 05/05/2017 showed: Retic Ct Pct at 3.2, RBC at 3.34, and Hgb at 9.4.  PLAN:  -Discussed pt labwork today, 07/09/18; PLT holding at 93k, HGB in 13s and Ferritin at 177 -No indication for PRBC transfusion -Will continue monthly Sandostatin, monthly IV Injectafer unless Ferritin levels >500 -Decrease Lysteda to two tablets BID, and will look to decrease until lowest effective dose -Continue  care with [facility] and consideration of liver transplant -Continue follow up with GI and liver management -Overall his PRBC transfusion requirements have decreased with a combination of sandostatin + IV iron + lysteda -Still having ongoing sub clinical GI losses as witnessed by  decreasing ferritin levels needing ongoing IV iron replacement. -he has had previous extensive GI evaluation with no overt source evidence of controllable GIB at this time. -Recommend walking 20-30 minutes every day -Will see the pt back in 12 weeks   -Continue IV Injectafer q4 weeks x 3 doses -continue q4weekly Sandostatin -labs q4weeks -RTC with Dr Irene Limbo in 12 weeks    All of the patients questions were answered with apparent satisfaction. The patient knows to call the clinic with any problems, questions or concerns.   The total  time spent in the appt was 20 minutes and more than 50% was on counseling and direct patient cares.   Sullivan Lone MD [place] AAHIVMS [facility] Hematology/Oncology Physician [facility]  (Office):       [phone] (Work cell):  [phone] (Fax):           [phone]  I, Baldwin [place], am acting as a scribe for Dr. Sullivan Lone.   .I have reviewed the above documentation for accuracy and completeness, and I agree with the above. Brunetta Genera MD

## 2018-07-09 ENCOUNTER — Inpatient Hospital Stay (HOSPITAL_BASED_OUTPATIENT_CLINIC_OR_DEPARTMENT_OTHER): Payer: 59 | Admitting: Hematology

## 2018-07-09 ENCOUNTER — Inpatient Hospital Stay: Payer: 59 | Attending: Hematology

## 2018-07-09 ENCOUNTER — Other Ambulatory Visit: Payer: Self-pay

## 2018-07-09 ENCOUNTER — Inpatient Hospital Stay: Payer: 59

## 2018-07-09 VITALS — BP 101/57 | HR 62 | Temp 99.1°F | Resp 17 | Ht 68.5 in | Wt 215.4 lb

## 2018-07-09 DIAGNOSIS — K922 Gastrointestinal hemorrhage, unspecified: Secondary | ICD-10-CM | POA: Insufficient documentation

## 2018-07-09 DIAGNOSIS — D500 Iron deficiency anemia secondary to blood loss (chronic): Secondary | ICD-10-CM | POA: Diagnosis not present

## 2018-07-09 DIAGNOSIS — K766 Portal hypertension: Secondary | ICD-10-CM | POA: Diagnosis not present

## 2018-07-09 DIAGNOSIS — R0602 Shortness of breath: Secondary | ICD-10-CM | POA: Diagnosis not present

## 2018-07-09 DIAGNOSIS — K7581 Nonalcoholic steatohepatitis (NASH): Secondary | ICD-10-CM | POA: Diagnosis not present

## 2018-07-09 DIAGNOSIS — Z87891 Personal history of nicotine dependence: Secondary | ICD-10-CM | POA: Diagnosis not present

## 2018-07-09 LAB — CBC WITH DIFFERENTIAL/PLATELET
Abs Immature Granulocytes: 0.03 10*3/uL (ref 0.00–0.07)
Basophils Absolute: 0.1 10*3/uL (ref 0.0–0.1)
Basophils Relative: 2 %
Eosinophils Absolute: 1 10*3/uL — ABNORMAL HIGH (ref 0.0–0.5)
Eosinophils Relative: 14 %
HCT: 40.6 % (ref 39.0–52.0)
Hemoglobin: 13.4 g/dL (ref 13.0–17.0)
Immature Granulocytes: 0 %
Lymphocytes Relative: 11 %
Lymphs Abs: 0.8 10*3/uL (ref 0.7–4.0)
MCH: 31.7 pg (ref 26.0–34.0)
MCHC: 33 g/dL (ref 30.0–36.0)
MCV: 96 fL (ref 80.0–100.0)
Monocytes Absolute: 1 10*3/uL (ref 0.1–1.0)
Monocytes Relative: 14 %
Neutro Abs: 4.2 10*3/uL (ref 1.7–7.7)
Neutrophils Relative %: 59 %
Platelets: 93 10*3/uL — ABNORMAL LOW (ref 150–400)
RBC: 4.23 MIL/uL (ref 4.22–5.81)
RDW: 17.2 % — ABNORMAL HIGH (ref 11.5–15.5)
WBC: 7 10*3/uL (ref 4.0–10.5)
nRBC: 0 % (ref 0.0–0.2)

## 2018-07-09 LAB — FERRITIN: Ferritin: 177 ng/mL (ref 24–336)

## 2018-07-09 MED ORDER — OCTREOTIDE ACETATE 30 MG IM KIT
30.0000 mg | PACK | Freq: Once | INTRAMUSCULAR | Status: AC
  Administered 2018-07-09: 30 mg via INTRAMUSCULAR

## 2018-07-09 MED ORDER — SODIUM CHLORIDE 0.9 % IV SOLN
Freq: Once | INTRAVENOUS | Status: AC
  Administered 2018-07-09: 11:00:00 via INTRAVENOUS
  Filled 2018-07-09: qty 250

## 2018-07-09 MED ORDER — OCTREOTIDE ACETATE 30 MG IM KIT
PACK | INTRAMUSCULAR | Status: AC
  Filled 2018-07-09: qty 1

## 2018-07-09 MED ORDER — SODIUM CHLORIDE 0.9 % IV SOLN
750.0000 mg | Freq: Once | INTRAVENOUS | Status: AC
  Administered 2018-07-09: 750 mg via INTRAVENOUS
  Filled 2018-07-09: qty 15

## 2018-07-09 NOTE — Patient Instructions (Signed)
Octreotide injection solution What is this medicine? OCTREOTIDE (ok TREE oh tide) is used to reduce blood levels of growth hormone in patients with a condition called acromegaly. This medicine also reduces flushing and watery diarrhea caused by certain types of cancer. This medicine may be used for other purposes; ask your health care provider or pharmacist if you have questions. COMMON BRAND NAME(S): Sandostatin, Sandostatin LAR What should I tell my health care provider before I take this medicine? They need to know if you have any of these conditions: -gallbladder disease -kidney disease -liver disease -an unusual or allergic reaction to octreotide, other medicines, foods, dyes, or preservatives -pregnant or trying to get pregnant -breast-feeding How should I use this medicine? This medicine is for injection under the skin or into a vein (only in emergency situations). It is usually given by a health care professional in a hospital or clinic setting. If you get this medicine at home, you will be taught how to prepare and give this medicine. Allow the injection solution to come to room temperature before use. Do not warm it artificially. Use exactly as directed. Take your medicine at regular intervals. Do not take your medicine more often than directed. It is important that you put your used needles and syringes in a special sharps container. Do not put them in a trash can. If you do not have a sharps container, call your pharmacist or healthcare provider to get one. Talk to your pediatrician regarding the use of this medicine in children. Special care may be needed. Overdosage: If you think you have taken too much of this medicine contact a poison control center or emergency room at once. NOTE: This medicine is only for you. Do not share this medicine with others. What if I miss a dose? If you miss a dose, take it as soon as you can. If it is almost time for your next dose, take only that  dose. Do not take double or extra doses. What may interact with this medicine? Do not take this medicine with any of the following medications: -cisapride -droperidol -general anesthetics -grepafloxacin -perphenazine -thioridazine This medicine may also interact with the following medications: -bromocriptine -cyclosporine -diuretics -medicines for blood pressure, heart disease, irregular heart beat -medicines for diabetes, including insulin -quinidine This list may not describe all possible interactions. Give your health care provider a list of all the medicines, herbs, non-prescription drugs, or dietary supplements you use. Also tell them if you smoke, drink alcohol, or use illegal drugs. Some items may interact with your medicine. What should I watch for while using this medicine? Visit your doctor or health care professional for regular checks on your progress. To help reduce irritation at the injection site, use a different site for each injection and make sure the solution is at room temperature before use. This medicine may cause increases or decreases in blood sugar. Signs of high blood sugar include frequent urination, unusual thirst, flushed or dry skin, difficulty breathing, drowsiness, stomach ache, nausea, vomiting or dry mouth. Signs of low blood sugar include chills, cool, pale skin or cold sweats, drowsiness, extreme hunger, fast heartbeat, headache, nausea, nervousness or anxiety, shakiness, trembling, unsteadiness, tiredness, or weakness. Contact your doctor or health care professional right away if you experience any of these symptoms. This medicine may cause a decrease in vitamin B12. You should make sure that you get enough vitamin B12 while you are taking this medicine. Discuss the foods you eat and the vitamins you take with  your health care professional. What side effects may I notice from receiving this medicine? Side effects that you should report to your doctor or  health care professional as soon as possible: -allergic reactions like skin rash, itching or hives, swelling of the face, lips, or tongue -changes in blood sugar -changes in heart rate -severe stomach pain Side effects that usually do not require medical attention (report to your doctor or health care professional if they continue or are bothersome): -diarrhea or constipation -gas or stomach pain -nausea, vomiting -pain, redness, swelling and irritation at site where injected This list may not describe all possible side effects. Call your doctor for medical advice about side effects. You may report side effects to FDA at 1-800-FDA-1088. Where should I keep my medicine? Keep out of the reach of children. Store in a refrigerator between 2 and 8 degrees C (36 and 46 degrees F). Protect from light. Allow to come to room temperature naturally. Do not use artificial heat. If protected from light, the injection may be stored at room temperature between 20 and 30 degrees C (70 and 86 degrees F) for 14 days. After the initial use, throw away any unused portion of a multiple dose vial after 14 days. Throw away unused portions of the ampules after use. NOTE: This sheet is a summary. It may not cover all possible information. If you have questions about this medicine, talk to your doctor, pharmacist, or health care provider.  2019 Elsevier/Gold Standard (2016-09-09 13:24:44)   Ferric carboxymaltose injection What is this medicine? FERRIC CARBOXYMALTOSE (ferr-ik car-box-ee-mol-toes) is an iron complex. Iron is used to make healthy red blood cells, which carry oxygen and nutrients throughout the body. This medicine is used to treat anemia in people with chronic kidney disease or people who cannot take iron by mouth. This medicine may be used for other purposes; ask your health care provider or pharmacist if you have questions. COMMON BRAND NAME(S): Injectafer What should I tell my health care provider  before I take this medicine? They need to know if you have any of these conditions: -high levels of iron in the blood -liver disease -an unusual or allergic reaction to iron, other medicines, foods, dyes, or preservatives -pregnant or trying to get pregnant -breast-feeding How should I use this medicine? This medicine is for infusion into a vein. It is given by a health care professional in a hospital or clinic setting. Talk to your pediatrician regarding the use of this medicine in children. Special care may be needed. Overdosage: If you think you have taken too much of this medicine contact a poison control center or emergency room at once. NOTE: This medicine is only for you. Do not share this medicine with others. What if I miss a dose? It is important not to miss your dose. Call your doctor or health care professional if you are unable to keep an appointment. What may interact with this medicine? Do not take this medicine with any of the following medications: -deferoxamine -dimercaprol -other iron products This list may not describe all possible interactions. Give your health care provider a list of all the medicines, herbs, non-prescription drugs, or dietary supplements you use. Also tell them if you smoke, drink alcohol, or use illegal drugs. Some items may interact with your medicine. What should I watch for while using this medicine? Visit your doctor or health care professional regularly. Tell your doctor if your symptoms do not start to get better or if they get worse.  You may need blood work done while you are taking this medicine. You may need to follow a special diet. Talk to your doctor. Foods that contain iron include: whole grains/cereals, dried fruits, beans, or peas, leafy green vegetables, and organ meats (liver, kidney). What side effects may I notice from receiving this medicine? Side effects that you should report to your doctor or health care professional as soon as  possible: -allergic reactions like skin rash, itching or hives, swelling of the face, lips, or tongue -dizziness -facial flushing Side effects that usually do not require medical attention (report to your doctor or health care professional if they continue or are bothersome): -changes in taste -constipation -headache -nausea, vomiting -pain, redness, or irritation at site where injected This list may not describe all possible side effects. Call your doctor for medical advice about side effects. You may report side effects to FDA at 1-800-FDA-1088. Where should I keep my medicine? This drug is given in a hospital or clinic and will not be stored at home. NOTE: This sheet is a summary. It may not cover all possible information. If you have questions about this medicine, talk to your doctor, pharmacist, or health care provider.  2019 Elsevier/Gold Standard (2016-03-10 09:40:29)

## 2018-07-10 ENCOUNTER — Telehealth: Payer: Self-pay | Admitting: Hematology

## 2018-07-10 NOTE — Telephone Encounter (Signed)
Scheduled appt per 6/1 los.  A calendar will be mailed out.

## 2018-07-13 MED FILL — METOPROLOL TARTRATE 25 MG T: 25 | 30 days supply | Qty: 30 | Fill #1

## 2018-07-17 DIAGNOSIS — I2510 Atherosclerotic heart disease of native coronary artery without angina pectoris: Secondary | ICD-10-CM | POA: Diagnosis not present

## 2018-07-17 DIAGNOSIS — I714 Abdominal aortic aneurysm, without rupture: Secondary | ICD-10-CM | POA: Diagnosis not present

## 2018-07-18 ENCOUNTER — Other Ambulatory Visit: Payer: Self-pay | Admitting: Gastroenterology

## 2018-07-18 DIAGNOSIS — K7460 Unspecified cirrhosis of liver: Secondary | ICD-10-CM | POA: Diagnosis not present

## 2018-07-18 DIAGNOSIS — K7290 Hepatic failure, unspecified without coma: Secondary | ICD-10-CM | POA: Diagnosis not present

## 2018-07-18 DIAGNOSIS — R0602 Shortness of breath: Secondary | ICD-10-CM | POA: Diagnosis not present

## 2018-07-26 DIAGNOSIS — R0902 Hypoxemia: Secondary | ICD-10-CM | POA: Diagnosis not present

## 2018-07-26 DIAGNOSIS — K7460 Unspecified cirrhosis of liver: Secondary | ICD-10-CM | POA: Diagnosis not present

## 2018-07-26 DIAGNOSIS — I350 Nonrheumatic aortic (valve) stenosis: Secondary | ICD-10-CM | POA: Diagnosis not present

## 2018-07-26 DIAGNOSIS — Z01818 Encounter for other preprocedural examination: Secondary | ICD-10-CM | POA: Diagnosis not present

## 2018-07-26 DIAGNOSIS — I714 Abdominal aortic aneurysm, without rupture: Secondary | ICD-10-CM | POA: Diagnosis not present

## 2018-07-26 DIAGNOSIS — Z23 Encounter for immunization: Secondary | ICD-10-CM | POA: Diagnosis not present

## 2018-07-26 DIAGNOSIS — I700 Atherosclerosis of aorta: Secondary | ICD-10-CM | POA: Diagnosis not present

## 2018-07-26 DIAGNOSIS — K7581 Nonalcoholic steatohepatitis (NASH): Secondary | ICD-10-CM | POA: Diagnosis not present

## 2018-07-27 ENCOUNTER — Telehealth: Payer: Self-pay | Admitting: Hematology

## 2018-07-27 NOTE — Telephone Encounter (Signed)
Per wife moved 6/29 appointment to 7/6 due to other appointment/procedure conflicts. Confirmed with wife. Message to provider.

## 2018-07-30 DIAGNOSIS — J181 Lobar pneumonia, unspecified organism: Secondary | ICD-10-CM | POA: Diagnosis not present

## 2018-08-01 DIAGNOSIS — C44229 Squamous cell carcinoma of skin of left ear and external auricular canal: Secondary | ICD-10-CM | POA: Diagnosis not present

## 2018-08-02 ENCOUNTER — Other Ambulatory Visit: Payer: Self-pay

## 2018-08-02 ENCOUNTER — Encounter: Payer: 59 | Admitting: Family Medicine

## 2018-08-02 NOTE — Progress Notes (Deleted)
Virtual Visit via Video Note  I connected with@  on 08/02/18 at  9:00 AM EDT by a video enabled telemedicine application and verified that I am speaking with the correct person using two identifiers.  Location patient: home Location provider:work or home office Persons participating in the virtual visit: patient, provider  I discussed the limitations of evaluation and management by telemedicine and the availability of in person appointments. The patient expressed understanding and agreed to proceed.   HPI: 68 y/o WM being seen today for follow up. I last saw him 04/10/18 for hosp f/u after having had acute hypoxic RF from pneumonia. He had repeat CT chest a bit later that showed his LUL opacity resolved but still needs f/u CT Aug or Sept 2020 to f/u pulm nodules.  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  He gets iron infusions, sandostatin, and lysteda via hematology.  He has chronic diastolic HF.  ROS: See pertinent positives and negatives per HPI.  Past Medical History:  Diagnosis Date  . AAA (abdominal aortic aneurysm) ([place]) 03/2014   3.2 cm on MR abd.  F/u aortic u/s 06/2014 showed 3.0 x 3.1 cm AAA: recheck 2 yrs recommended (followed by cardiologist).  Minimal growth on 10/2016 CT abd done for epig pain.  Repeat u/s 10/2018.  [name] Anemia due to chronic blood loss 2018/19   GI: transfusions x >20 required; multiple endoscopies and bleeding scans unrevealing.  Hemolysis ruled out by hematologist.  Marshell Levan and octreotide + monthly iron infusions as of 04/2017.  . Bilateral renal cysts    simple (03/2014 MRI)  . CAD (coronary artery disease)   . Cholelithiases 2018   asymptomatic  . Chronic diastolic heart failure ([place])   . Cirrhosis ([place]) 05/2016   secondary to NASH; most recent u/s abd 07/2016 showed splenomegaly.  Hx of portal HTN changes with mild ascites and splenomegaly.   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  [name] Cirrhosis ([place])    [facility] liver transplant clinic eval 05/15/18--->they agree he needs liver transplant but want to get some pulm colleagues to weigh in on his lung fxn/pulm scarring---PULM REH[facility] recommended  . Diabetes mellitus with complication ([place]) 35/4656   A1c 6.8%  . History of blood transfusion 2018 X 4 dates   "low blood" (09/21/2016)  . Hyperlipemia, mixed    elevated LFTs when on statins.    . Hypertension    Cr bump 04/01/16 so I changed benicar-hct to benicar plain and added amlodipine 5 mg.  . Iron deficiency anemia 05/2016   Acute blood loss anemia: hospitalized, required transfusion x 3 U: colonoscopy and capsule study unrevealing.  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  W/u for ? hemolytic anemia to be pursued by hematologist as outpt.  Dr. [name], GI at [facility] following, too---he rec'd onc do bone marrow bx as of Jan 2019  . Liver failure ([place]) 2020   NASH cirrhosis-->pt approved for liver [facility] list at [facility] as of 05/16/2018  . Lung field abnormal finding on examination    Bibasilar L>R mild insp crackles-->x-ray showed mild interstitial changes/fibrosis/scarring.  Changes noted on all CXRs in 2018/2019.  Liver Transplant eval 03/2018->mild restriction on PFTs but no obstruction.  . Microscopic hematuria    Eval unremarkable by  Dr. Eulogio Ditch.  [name] NASH (nonalcoholic steatohepatitis)    Fatty liver on MR abd 03/2014, + hx of elevated transaminases and bili: followed by Dr. [name].  . Obesity   . Pulmonary fibrosis ([place])    PFTs: restrictive lung dz ([facility] transplant eval 05/2018)  . Spleen enlarged     Past Surgical History:  Procedure Laterality Date  . CARDIAC CATHETERIZATION    . CARDIOVASCULAR STRESS TEST  01/17/2012; 05/04/16   Normal stress nuclear study x 2 (2018--Normal perfusion. LVEF  66% with normal wall motion. This is a low risk study).  . CERVICAL DISCECTOMY  1992  . COLONOSCOPY N/A 05/27/2016   No site/explanation for blood loss found.  Erythematous mucosa in cecum and ascending colon--Cecal bx: normal.  Procedure: COLONOSCOPY;  Surgeon: Carol Ada, MD;  Location: [facility] ENDOSCOPY;  Service: Endoscopy;  Laterality: N/A;  . COLONOSCOPY W/ POLYPECTOMY  09/10/2014   Polypectomy x 2: recall 5 yrs (Dr. [name]).  . CORONARY ANGIOPLASTY    . CORONARY ARTERY BYPASS GRAFT  03/2006  . ENTEROSCOPY N/A 07/31/2016   Procedure: ENTEROSCOPY;  Surgeon: Ladene Artist, MD;  Location: [facility] ENDOSCOPY;  Service: Endoscopy;  Laterality: N/A;  . ENTEROSCOPY N/A 12/02/2016   Procedure: ENTEROSCOPY;  Surgeon: Carol Ada, MD;  Location: [facility] ENDOSCOPY;  Service: Endoscopy;  Laterality: N/A;  . ESOPHAGOGASTRODUODENOSCOPY N/A 05/25/2016   No site/explanation for blood loss found.  Procedure: ESOPHAGOGASTRODUODENOSCOPY (EGD);  Surgeon: Juanita Craver, MD;  Location: [facility] ENDOSCOPY;  Service: Endoscopy;  Laterality: N/A;  . ESOPHAGOGASTRODUODENOSCOPY N/A 09/23/2016   Procedure: ESOPHAGOGASTRODUODENOSCOPY (EGD);  Surgeon: Carol Ada, MD;  Location: [place];  Service: Endoscopy;  Laterality: N/A;  . GIVENS CAPSULE STUDY N/A 05/27/2016   No source identified.  Repeat 10/2016--results pending.  Procedure: GIVENS CAPSULE STUDY;  Surgeon: Carol Ada, MD;  Location: [facility] ENDOSCOPY;  Service: Endoscopy;  Laterality: N/A;  . GIVENS CAPSULE STUDY N/A 10/15/2016   Procedure: GIVENS CAPSULE STUDY;  Surgeon: Carol Ada, MD;  Location: [facility] ENDOSCOPY;  Service: Endoscopy;  Laterality: N/A;  . GIVENS CAPSULE STUDY N/A 02/13/2017   Procedure: GIVENS CAPSULE STUDY;  Surgeon: Carol Ada, MD;  Location: [place];  Service: Endoscopy;  Laterality: N/A;  . NECK SURGERY  1991  . NM GI BLOOD LOSS  01/27/2017   NEGATIVE  . SMALL BOWEL ENTEROSCOPY  10/2016   ([facility], Dr. [name]: Double balloon enteroscopy--to the level of  proximal ileum) jejunal polyps- which were removed--not bleeding & benign.  No source of bleeding was identified.  . TRANSTHORACIC ECHOCARDIOGRAM  05/25/2016; 10/2017   05/2016: EF 60%, normal wall motion, grd II DD, mild aortic stenosis, dilated aortic root and ascending aorta. 10/2017: Mild LVH; no mention made of LV function.  Moderate aorticstenosis with mean gradient 19 peak gradient 41. AVA 1.1 cm^2.  Mild ascending aorta dilation.  Mean gradient slightly lower than in 2018.  [name] VASECTOMY  1977    Family History  Problem Relation Age of Onset  . Hypertension Mother   . Cancer - Other Mother        liver cancer  . Heart disease Father   . Heart attack Father   . Cancer - Lung Father   . Diabetes Father   . Liver disease Sister   . Anemia Sister   . Heart disease Sister   . Heart attack Sister   . Heart disease Brother        CABG 20 YEARS AGO  . Hypertension Brother   . Mesothelioma Brother  HALF-BROTHER  . Cancer - Other Brother        CLL  . Cancer - Lung Brother        Mets to brain  . Cancer - Lung Sister        HALF-SISTER  . Liver disease Brother   . Heart disease Brother        CABG-2012  . Emphysema Maternal Grandfather   . Cancer - Other Paternal Grandmother        Stomach  . Heart attack Paternal Grandfather      Current Outpatient Medications:  .  fluticasone (FLONASE) 50 MCG/ACT nasal spray, Place 2 sprays into both nostrils daily. (Patient taking differently: Place 2 sprays into both nostrils daily as needed for allergies. ), Disp: 16 g, Rfl: 6 .  lactulose (CHRONULAC) 10 GM/15ML solution, Take 45 mLs (30 g total) by mouth 2 (two) times daily as needed for mild constipation. Take this until you have three BM's a day., Disp: , Rfl:  .  metoprolol tartrate (LOPRESSOR) 25 MG tablet, TAKE 1/2 TABLET BY MOUTH TWICE A DAY (Patient taking differently: Take 12.5 mg by mouth 2 (two) times daily. ), Disp: 30 tablet, Rfl: 5 .  ondansetron (ZOFRAN) 4 MG tablet,  Take 1 tablet (4 mg total) by mouth every 8 (eight) hours as needed for nausea or vomiting., Disp: 10 tablet, Rfl: 1 .  pantoprazole (PROTONIX) 40 MG tablet, TAKE 1 TABLET BY MOUTH TWICE DAILY BEFORE A MEAL, Disp: 60 tablet, Rfl: 3 .  potassium chloride SA (K-DUR,KLOR-CON) 20 MEQ tablet, TAKE 1 TABLET (20 MEQ TOTAL) BY MOUTH TWICE A DAY (Patient taking differently: Take 20 mEq by mouth daily. ), Disp: 60 tablet, Rfl: 6 .  spironolactone (ALDACTONE) 25 MG tablet, Take 12.5 mg by mouth daily., Disp: , Rfl:  .  torsemide (DEMADEX) 20 MG tablet, TAKE 2 TABLETS BY MOUTH DAILY. (Patient taking differently: Take 40 mg by mouth daily. ), Disp: 60 tablet, Rfl: 3 .  tranexamic acid (LYSTEDA) 650 MG TABS tablet, TAKE 2 TABLETS BY MOUTH 3 TIMES DAILY., Disp: 180 tablet, Rfl: 1 No current facility-administered medications for this visit.   Facility-Administered Medications Ordered in Other Visits:  .  heparin lock flush 100 unit/mL, 500 Units, Intracatheter, Daily PRN, Truitt Merle, MD .  sodium chloride flush (NS) 0.9 % injection 10 mL, 10 mL, Intracatheter, PRN, Truitt Merle, MD  EXAM:  VITALS per patient if applicable:  GENERAL: alert, oriented, appears well and in no acute distress  HEENT: atraumatic, conjunttiva clear, no obvious abnormalities on inspection of external nose and ears  NECK: normal movements of the head and neck  LUNGS: on inspection no signs of respiratory distress, breathing rate appears normal, no obvious gross SOB, gasping or wheezing  CV: no obvious cyanosis  MS: moves all visible extremities without noticeable abnormality  PSYCH/NEURO: pleasant and cooperative, no obvious depression or anxiety, speech and thought processing grossly intact  LABS: none today  Lab Results  Component Value Date   TSH 1.19 05/04/2016   Lab Results  Component Value Date   WBC 7.0 07/09/2018   HGB 13.4 07/09/2018   HCT 40.6 07/09/2018   MCV 96.0 07/09/2018   PLT 93 (L) 07/09/2018   Lab  Results  Component Value Date   CREATININE 1.30 (H) 04/12/2018   BUN 15 04/12/2018   NA 134 (L) 04/12/2018   K 4.1 04/12/2018   CL 100 04/12/2018   CO2 26 04/12/2018   Lab Results  Component  Value Date   ALT 34 04/12/2018   AST 58 (H) 04/12/2018   ALKPHOS 111 04/12/2018   BILITOT 9.8 (H) 04/12/2018   Lab Results  Component Value Date   CHOL 162 01/25/2017   Lab Results  Component Value Date   HDL 47 01/25/2017   Lab Results  Component Value Date   LDLCALC 100 (H) 01/25/2017   Lab Results  Component Value Date   TRIG 76 01/25/2017   Lab Results  Component Value Date   CHOLHDL 3.4 01/25/2017   Lab Results  Component Value Date   PSA 0.11 05/15/2018   PSA 0.31 06/29/2016   Lab Results  Component Value Date   HGBA1C 4.7 05/15/2018    ASSESSMENT AND PLAN:  Discussed the following assessment and plan:  No diagnosis found.     I discussed the assessment and treatment plan with the patient. The patient was provided an opportunity to ask questions and all were answered. The patient agreed with the plan and demonstrated an understanding of the instructions.   The patient was advised to call back or seek an in-person evaluation if the symptoms worsen or if the condition fails to improve as anticipated.  F/u: ***  Signed:  Crissie Sickles, MD           08/02/2018

## 2018-08-06 ENCOUNTER — Ambulatory Visit (INDEPENDENT_AMBULATORY_CARE_PROVIDER_SITE_OTHER): Payer: 59 | Admitting: Family Medicine

## 2018-08-06 ENCOUNTER — Ambulatory Visit: Payer: 59

## 2018-08-06 ENCOUNTER — Encounter: Payer: Self-pay | Admitting: Family Medicine

## 2018-08-06 ENCOUNTER — Other Ambulatory Visit: Payer: 59

## 2018-08-06 ENCOUNTER — Other Ambulatory Visit: Payer: Self-pay

## 2018-08-06 VITALS — BP 110/66 | HR 68 | Temp 98.6°F | Wt 213.5 lb

## 2018-08-06 DIAGNOSIS — K7460 Unspecified cirrhosis of liver: Secondary | ICD-10-CM | POA: Diagnosis not present

## 2018-08-06 DIAGNOSIS — I10 Essential (primary) hypertension: Secondary | ICD-10-CM

## 2018-08-06 DIAGNOSIS — I714 Abdominal aortic aneurysm, without rupture, unspecified: Secondary | ICD-10-CM

## 2018-08-06 DIAGNOSIS — K922 Gastrointestinal hemorrhage, unspecified: Secondary | ICD-10-CM

## 2018-08-06 DIAGNOSIS — K7210 Chronic hepatic failure without coma: Secondary | ICD-10-CM

## 2018-08-06 MED ORDER — TORSEMIDE 20 MG PO TABS: 40.0000 mg | ORAL_TABLET | Freq: Every day | ORAL | 1 refills | Status: DC

## 2018-08-06 MED ORDER — PANTOPRAZOLE SODIUM 40 MG PO TBEC: DELAYED_RELEASE_TABLET | ORAL | 1 refills | Status: DC

## 2018-08-06 MED ORDER — SPIRONOLACTONE 25 MG PO TABS: 12.5000 mg | ORAL_TABLET | Freq: Every day | ORAL | 1 refills | Status: DC

## 2018-08-06 MED ORDER — POTASSIUM CHLORIDE CRYS ER 20 MEQ PO TBCR: EXTENDED_RELEASE_TABLET | ORAL | 1 refills | Status: DC

## 2018-08-06 MED ORDER — METOPROLOL TARTRATE 25 MG PO TABS: 12.5000 mg | ORAL_TABLET | Freq: Two times a day (BID) | ORAL | 1 refills | Status: DC

## 2018-08-06 MED FILL — SPIRONOLACTONE 25 MG TABS: 25 | 90 days supply | Qty: 45 | Fill #0

## 2018-08-06 MED FILL — TORSEMIDE 20 MG TABLET: 20 | 90 days supply | Qty: 180 | Fill #0

## 2018-08-06 MED FILL — POTASSIUM CHLORIDE CRYS ER: 20 | 90 days supply | Qty: 180 | Fill #0

## 2018-08-06 MED FILL — PANTOPRAZOLE SOD DR 40 MG T: 40 | 90 days supply | Qty: 180 | Fill #0

## 2018-08-06 MED FILL — METOPROLOL TARTRATE 25 MG T: 25 | 90 days supply | Qty: 90 | Fill #0

## 2018-08-06 NOTE — H&P (View-Only) (Signed)
Virtual Visit via Video Note  I connected with William [name] on 08/06/18 at 10:30 AM EDT by a video enabled telemedicine application and verified that I am speaking with the correct person using two identifiers.  Location patient: home Location provider:work or home office Persons participating in the virtual visit: patient, William [name]'s wife, and myself.   I discussed the limitations of evaluation and management by telemedicine and the availability of in person appointments. The patient expressed understanding and agreed to proceed.  Telemedicine visit is a necessity given the COVID-19 restrictions in place at the current time.  HPI: 68 y/o WM being seen today for f/u HTN (both systemic and portal). 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  He has had problems the last 2 yrs with anemia secondary to GI blood loss from unidentified site, many blood transfusions have been required.  His bleeding/blood counts have been fairly stable the last 1/2 year or so on IV iron, lysteda, and monthly sandostatin by his hematologist, Dr. Irene Limbo, whom he followed up with most recently on 07/09/18.  His lysteda dose was decreased, otherwise no changes made.  Followed by Dr. [name], GI-->plan for monitoring EGD 08/21/2018.  Interim hx: Says he is feeling pretty good.  No melena or hematochezia.   Recently had some BCC's removed.  Got CT abd 07/14/18 and it showed atherosclerotic ulceration. He is going to have an endovascular vascular aneurism repair CMET was done at [facility] at beginning of the month (07/14/18) and labs were stable, his bili was down into the 6 range per his report (I have no [facility] lab records or vascular office records available to me at this time). CBC stable earlier this month with Dr. Irene Limbo.  ROS: See pertinent positives and negatives per HPI.  Past Medical History:  Diagnosis Date  . AAA (abdominal aortic aneurysm) ([place]) 03/2014   3.2 cm on MR abd.  F/u aortic u/s 06/2014  showed 3.0 x 3.1 cm AAA: recheck 2 yrs recommended (followed by cardiologist).  Minimal growth on 10/2016 CT abd done for epig pain.  Repeat u/s 10/2018.  [name] Anemia due to chronic blood loss 2018/19   GI: transfusions x >20 required; multiple endoscopies and bleeding scans unrevealing.  Hemolysis ruled out by hematologist.  Marshell Levan and octreotide + monthly iron infusions as of 04/2017.  . Bilateral renal cysts    simple (03/2014 MRI)  . CAD (coronary artery disease)   . Cholelithiases 2018   asymptomatic  . Chronic diastolic heart failure ([place])   . Cirrhosis ([place]) 05/2016   secondary to NASH; most recent u/s abd 07/2016 showed splenomegaly.  Hx of portal HTN changes with mild ascites and splenomegaly.  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  [name] Cirrhosis ([place])    [facility] liver transplant clinic eval 05/15/18--->they agree he needs liver transplant but want to get some pulm colleagues to weigh in on his lung fxn/pulm scarring---PULM REHAB recommended  . Diabetes mellitus with complication ([place]) 16/1096   A1c 6.8%  . History of blood transfusion 2018 X 4 dates   "low blood" (09/21/2016)  . Hyperlipemia, mixed    elevated LFTs when on statins.    . Hypertension    Cr bump 04/01/16 so I changed benicar-hct to benicar plain and added amlodipine 5 mg.  . Iron deficiency anemia 05/2016   Acute blood loss anemia: hospitalized, required transfusion x 3 U: colonoscopy and capsule study unrevealing.  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  W/u for ? hemolytic anemia to be pursued by hematologist as outpt.  Dr. [name], GI at [facility] following, too---he rec'd onc do bone marrow bx as of Jan 2019  . Liver failure ([place]) 2020   NASH cirrhosis-->William [name] approved for liver transplant list [facility] as of 05/16/2018  . Lung field abnormal finding on examination    Bibasilar L>R mild insp  crackles-->x-ray showed mild interstitial changes/fibrosis/scarring.  Changes noted on all CXRs in 2018/2019.  Liver Transplant eval 03/2018->mild restriction on PFTs but no obstruction.  . Microscopic hematuria    Eval unremarkable by Dr. Eulogio Ditch.  [name] NASH (nonalcoholic steatohepatitis)    Fatty liver on MR abd 03/2014, + hx of elevated transaminases and bili: followed by Dr. [name].  . Obesity   . Pulmonary fibrosis ([place])    PFTs: restrictive lung dz ([facility] Liver transplant eval 05/2018)  . Spleen enlarged     Past Surgical History:  Procedure Laterality Date  . CARDIAC CATHETERIZATION    . CARDIOVASCULAR STRESS TEST  01/17/2012; 05/04/16   Normal stress nuclear study x 2 (2018--Normal perfusion. LVEF 66% with normal wall motion. This is a low risk study).  . CERVICAL DISCECTOMY  1992  . COLONOSCOPY N/A 05/27/2016   No site/explanation for blood loss found.  Erythematous mucosa in cecum and ascending colon--Cecal bx: normal.  Procedure: COLONOSCOPY;  Surgeon: Carol Ada, MD;  Location: [facility] ENDOSCOPY;  Service: Endoscopy;  Laterality: N/A;  . COLONOSCOPY W/ POLYPECTOMY  09/10/2014   Polypectomy x 2: recall 5 yrs (Dr. [name]).  . CORONARY ANGIOPLASTY    . CORONARY ARTERY BYPASS GRAFT  03/2006  . ENTEROSCOPY N/A 07/31/2016   Procedure: ENTEROSCOPY;  Surgeon: Ladene Artist, MD;  Location: [facility] ENDOSCOPY;  Service: Endoscopy;  Laterality: N/A;  . ENTEROSCOPY N/A 12/02/2016   Procedure: ENTEROSCOPY;  Surgeon: Carol Ada, MD;  Location: [facility] ENDOSCOPY;  Service: Endoscopy;  Laterality: N/A;  . ESOPHAGOGASTRODUODENOSCOPY N/A 05/25/2016   No site/explanation for blood loss found.  Procedure: ESOPHAGOGASTRODUODENOSCOPY (EGD);  Surgeon: Juanita Craver, MD;  Location: [facility] ENDOSCOPY;  Service: Endoscopy;  Laterality: N/A;  . ESOPHAGOGASTRODUODENOSCOPY N/A 09/23/2016   Procedure: ESOPHAGOGASTRODUODENOSCOPY (EGD);  Surgeon: Carol Ada, MD;  Location: [place];  Service: Endoscopy;  Laterality: N/A;  .  GIVENS CAPSULE STUDY N/A 05/27/2016   No source identified.  Repeat 10/2016--results pending.  Procedure: GIVENS CAPSULE STUDY;  Surgeon: Carol Ada, MD;  Location: [facility] ENDOSCOPY;  Service: Endoscopy;  Laterality: N/A;  . GIVENS CAPSULE STUDY N/A 10/15/2016   Procedure: GIVENS CAPSULE STUDY;  Surgeon: Carol Ada, MD;  Location: [facility] ENDOSCOPY;  Service: Endoscopy;  Laterality: N/A;  . GIVENS CAPSULE STUDY N/A 02/13/2017   Procedure: GIVENS CAPSULE STUDY;  Surgeon: Carol Ada, MD;  Location: [place];  Service: Endoscopy;  Laterality: N/A;  . NECK SURGERY  1991  . NM GI BLOOD LOSS  01/27/2017   NEGATIVE  . SMALL BOWEL ENTEROSCOPY  10/2016   ([facility], Dr. [name]: Double balloon enteroscopy--to the level of proximal ileum) jejunal polyps- which were removed--not bleeding & benign.  No source of bleeding was identified.  . TRANSTHORACIC ECHOCARDIOGRAM  05/25/2016; 10/2017   05/2016: EF 60%, normal wall motion, grd II DD, mild aortic stenosis, dilated aortic root and ascending aorta. 10/2017: Mild LVH; no mention made of LV function.  Moderate aorticstenosis with mean gradient 19 peak gradient 41. AVA 1.1 cm^2.  Mild ascending aorta dilation.  Mean gradient slightly lower than in 2018.  [name] VASECTOMY  1977    Family History  Problem Relation Age of Onset  . Hypertension Mother   . Cancer - Other Mother        liver cancer  . Heart disease Father   . Heart attack Father   . Cancer - Lung Father   . Diabetes Father   . Liver disease Sister   . Anemia Sister   . Heart disease Sister   . Heart attack Sister   . Heart disease Brother        CABG 20 YEARS AGO  . Hypertension Brother   . Mesothelioma Brother        HALF-BROTHER  . Cancer - Other Brother        CLL  . Cancer - Lung Brother        Mets to brain  . Cancer - Lung Sister        HALF-SISTER  . Liver disease Brother   . Heart disease Brother        CABG-2012  . Emphysema Maternal Grandfather   . Cancer - Other Paternal Grandmother         Stomach  . Heart attack Paternal Grandfather      Current Outpatient Medications:  .  fluticasone (FLONASE) 50 MCG/ACT nasal spray, Place 2 sprays into both nostrils daily. (Patient taking differently: Place 2 sprays into both nostrils daily as needed for allergies. ), Disp: 16 g, Rfl: 6 .  lactulose (CHRONULAC) 10 GM/15ML solution, Take 45 mLs (30 g total) by mouth 2 (two) times daily as needed for mild constipation. Take this until you have three BM's a day., Disp: , Rfl:  .  metoprolol tartrate (LOPRESSOR) 25 MG tablet, TAKE 1/2 TABLET BY MOUTH TWICE A DAY (Patient taking differently: Take 12.5 mg by mouth 2 (two) times daily. ), Disp: 30 tablet, Rfl: 5 .  ondansetron (ZOFRAN) 4 MG tablet, Take 1 tablet (4 mg total) by mouth every 8 (eight) hours as needed for nausea or vomiting., Disp: 10 tablet, Rfl: 1 .  pantoprazole (PROTONIX) 40 MG tablet, TAKE 1 TABLET BY MOUTH TWICE DAILY BEFORE A MEAL, Disp: 60 tablet, Rfl: 3 .  potassium chloride SA (K-DUR,KLOR-CON) 20 MEQ tablet, TAKE 1 TABLET (20 MEQ TOTAL) BY MOUTH TWICE A DAY (Patient taking differently: Take 20 mEq by mouth daily. ), Disp: 60 tablet, Rfl: 6 .  spironolactone (ALDACTONE) 25 MG tablet, Take 12.5 mg by mouth daily., Disp: , Rfl:  .  torsemide (DEMADEX) 20 MG tablet, TAKE 2 TABLETS BY MOUTH DAILY. (Patient taking differently: Take 40 mg by mouth daily. ), Disp: 60 tablet, Rfl: 3 .  tranexamic acid (LYSTEDA) 650 MG TABS tablet, TAKE 2 TABLETS BY MOUTH 3 TIMES DAILY., Disp: 180 tablet, Rfl: 1 No current facility-administered medications for this visit.   Facility-Administered Medications Ordered in Other Visits:  .  heparin lock flush 100 unit/mL, 500 Units, Intracatheter, Daily PRN, Truitt Merle, MD .  sodium chloride flush (NS) 0.9 % injection 10 mL, 10 mL, Intracatheter, PRN, Truitt Merle, MD  EXAMTonette Bihari per patient if applicable: BP 240/97 (BP Location: Left Arm, Patient Position: Sitting, Cuff Size: Large)   Pulse 68    Temp 98.6 F (37 C) (Oral)   Wt 213 lb 8 oz (96.8 kg)   SpO2 94%   BMI 31.99 kg/m    GENERAL: alert, oriented, appears well and in no acute  distress  HEENT: atraumatic, conjunttiva clear, no obvious abnormalities on inspection of external nose and ears No obvious icterus or jaundice.  NECK: normal movements of the head and neck  LUNGS: on inspection no signs of respiratory distress, breathing rate appears normal, no obvious gross SOB, gasping or wheezing  CV: no obvious cyanosis  MS: moves all visible extremities without noticeable abnormality  PSYCH/NEURO: pleasant and cooperative, no obvious depression or anxiety, speech and thought processing grossly intact  LABS: none    Chemistry      Component Value Date/Time   NA 134 (L) 04/12/2018 0910   NA 139 02/10/2017 0948   K 4.1 04/12/2018 0910   K 3.9 02/10/2017 0948   CL 100 04/12/2018 0910   CO2 26 04/12/2018 0910   CO2 24 02/10/2017 0948   BUN 15 04/12/2018 0910   BUN 10.4 02/10/2017 0948   CREATININE 1.30 (H) 04/12/2018 0910   CREATININE 1.09 03/14/2018 0928   CREATININE 1.3 02/10/2017 0948      Component Value Date/Time   CALCIUM 8.7 (L) 04/12/2018 0910   CALCIUM 8.9 02/10/2017 0948   ALKPHOS 111 04/12/2018 0910   ALKPHOS 108 02/10/2017 0948   AST 58 (H) 04/12/2018 0910   AST 50 (H) 03/14/2018 0928   AST 30 02/10/2017 0948   ALT 34 04/12/2018 0910   ALT 39 03/14/2018 0928   ALT 23 02/10/2017 0948   BILITOT 9.8 (H) 04/12/2018 0910   BILITOT 7.8 (HH) 03/14/2018 0928   BILITOT 4.97 (HH) 02/10/2017 0948     Lab Results  Component Value Date   WBC 7.0 07/09/2018   HGB 13.4 07/09/2018   HCT 40.6 07/09/2018   MCV 96.0 07/09/2018   PLT 93 (L) 07/09/2018   Lab Results  Component Value Date   IRON 61 05/10/2018   TIBC 310 05/10/2018   FERRITIN 177 07/09/2018    ASSESSMENT AND PLAN:  Discussed the following assessment and plan:  1) HTN: The current medical regimen is effective;  continue present  plan and medications. Will get records of recent lytes/cr from his visit at [facility] 3 wks ago.  2) Liver failure from nonalcoholic cirrhosis: stable. On transplant list.  Per William [name] his labs were stable at [facility] 3 wks ago--will obtain records.  3) Recurrent/ongoing GI bleeding, unknown source: this has been stable lately on monthly iron infusions, lysteda (dose decreased to 1 of the 650 mg tabs bid by Dr. Irene Limbo recently), and sandostatin monthly.  4) AAA, with atherosclerotic ulceration per William [name]/wife report.   [facility] detected this about 3 wks ago at a f/u imaging done there. He now has plans for endovascular aneurism repair at [facility] 08/23/2018 by Dr. Kathyrn Sheriff.  I RF'd all William [name]'s routine meds today, 3 mo supply with 1 RF. I did not do any labs today. Will obtain most recent [facility] records.   I discussed the assessment and treatment plan with the patient. The patient was provided an opportunity to ask questions and all were answered. The patient agreed with the plan and demonstrated an understanding of the instructions.   The patient was advised to call back or seek an in-person evaluation if the symptoms worsen or if the condition fails to improve as anticipated.  F/U: 3 mo  Signed:  Crissie Sickles, MD           08/06/2018

## 2018-08-06 NOTE — Progress Notes (Signed)
Virtual Visit via Video Note  I connected with pt on 08/06/18 at 10:30 AM EDT by a video enabled telemedicine application and verified that I am speaking with the correct person using two identifiers.  Location patient: home Location provider:work or home office Persons participating in the virtual visit: patient, pt's wife, and myself.   I discussed the limitations of evaluation and management by telemedicine and the availability of in person appointments. The patient expressed understanding and agreed to proceed.  Telemedicine visit is a necessity given the COVID-19 restrictions in place at the current time.  HPI: 68 y/o WM being seen today for f/u HTN (both systemic and portal). 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  He has had problems the last 2 yrs with anemia secondary to GI blood loss from unidentified site, many blood transfusions have been required.  His bleeding/blood counts have been fairly stable the last 1/2 year or so on IV iron, lysteda, and monthly sandostatin by his hematologist, Dr. Irene Limbo, whom he followed up with most recently on 07/09/18.  His lysteda dose was decreased, otherwise no changes made.  Followed by Dr. [name], GI-->plan for monitoring EGD 08/21/2018.  Interim hx: Says he is feeling pretty good.  No melena or hematochezia.   Recently had some BCC's removed.  Got CT abd 07/14/18 and it showed atherosclerotic ulceration. He is going to have an endovascular vascular aneurism repair CMET was done at [facility] at beginning of the month (07/14/18) and labs were stable, his bili was down into the 6 range per his report (I have no [facility] lab records or vascular office records available to me at this time). CBC stable earlier this month with Dr. Irene Limbo.  ROS: See pertinent positives and negatives per HPI.  Past Medical History:  Diagnosis Date  . AAA (abdominal aortic aneurysm) ([place]) 03/2014   3.2 cm on MR abd.  F/u aortic u/s 06/2014  showed 3.0 x 3.1 cm AAA: recheck 2 yrs recommended (followed by cardiologist).  Minimal growth on 10/2016 CT abd done for epig pain.  Repeat u/s 10/2018.  [name] Anemia due to chronic blood loss 2018/19   GI: transfusions x >20 required; multiple endoscopies and bleeding scans unrevealing.  Hemolysis ruled out by hematologist.  Marshell Levan and octreotide + monthly iron infusions as of 04/2017.  . Bilateral renal cysts    simple (03/2014 MRI)  . CAD (coronary artery disease)   . Cholelithiases 2018   asymptomatic  . Chronic diastolic heart failure ([place])   . Cirrhosis ([place]) 05/2016   secondary to NASH; most recent u/s abd 07/2016 showed splenomegaly.  Hx of portal HTN changes with mild ascites and splenomegaly.  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  [name] Cirrhosis ([place])    [facility] liver transplant clinic eval 05/15/18--->they agree he needs liver transplant but want to get some pulm colleagues to weigh in on his lung fxn/pulm scarring---PULM REHAB recommended  . Diabetes mellitus with complication ([place]) 22/4825   A1c 6.8%  . History of blood transfusion 2018 X 4 dates   "low blood" (09/21/2016)  . Hyperlipemia, mixed    elevated LFTs when on statins.    . Hypertension    Cr bump 04/01/16 so I changed benicar-hct to benicar plain and added amlodipine 5 mg.  . Iron deficiency anemia 05/2016   Acute blood loss anemia: hospitalized, required transfusion x 3 U: colonoscopy and capsule study unrevealing.  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  W/u for ? hemolytic anemia to be pursued by hematologist as outpt.  Dr. [name], GI at [facility] following, too---he rec'd onc do bone marrow bx as of Jan 2019  . Liver failure ([place]) 2020   NASH cirrhosis-->pt approved for liver transplant list at [facility] as of 05/16/2018  . Lung field abnormal finding on examination    Bibasilar L>R mild insp  crackles-->x-ray showed mild interstitial changes/fibrosis/scarring.  Changes noted on all CXRs in 2018/2019.  Liver Transplant eval 03/2018->mild restriction on PFTs but no obstruction.  . Microscopic hematuria    Eval unremarkable by Dr. Eulogio Ditch.  [name] NASH (nonalcoholic steatohepatitis)    Fatty liver on MR abd 03/2014, + hx of elevated transaminases and bili: followed by Dr. [name].  . Obesity   . Pulmonary fibrosis ([place])    PFTs: restrictive lung dz ([facility] Liver transplant eval 05/2018)  . Spleen enlarged     Past Surgical History:  Procedure Laterality Date  . CARDIAC CATHETERIZATION    . CARDIOVASCULAR STRESS TEST  01/17/2012; 05/04/16   Normal stress nuclear study x 2 (2018--Normal perfusion. LVEF 66% with normal wall motion. This is a low risk study).  . CERVICAL DISCECTOMY  1992  . COLONOSCOPY N/A 05/27/2016   No site/explanation for blood loss found.  Erythematous mucosa in cecum and ascending colon--Cecal bx: normal.  Procedure: COLONOSCOPY;  Surgeon: Carol Ada, MD;  Location: [facility] ENDOSCOPY;  Service: Endoscopy;  Laterality: N/A;  . COLONOSCOPY W/ POLYPECTOMY  09/10/2014   Polypectomy x 2: recall 5 yrs (Dr. [name]).  . CORONARY ANGIOPLASTY    . CORONARY ARTERY BYPASS GRAFT  03/2006  . ENTEROSCOPY N/A 07/31/2016   Procedure: ENTEROSCOPY;  Surgeon: Ladene Artist, MD;  Location: [facility] ENDOSCOPY;  Service: Endoscopy;  Laterality: N/A;  . ENTEROSCOPY N/A 12/02/2016   Procedure: ENTEROSCOPY;  Surgeon: Carol Ada, MD;  Location: [facility] ENDOSCOPY;  Service: Endoscopy;  Laterality: N/A;  . ESOPHAGOGASTRODUODENOSCOPY N/A 05/25/2016   No site/explanation for blood loss found.  Procedure: ESOPHAGOGASTRODUODENOSCOPY (EGD);  Surgeon: Juanita Craver, MD;  Location: [facility] ENDOSCOPY;  Service: Endoscopy;  Laterality: N/A;  . ESOPHAGOGASTRODUODENOSCOPY N/A 09/23/2016   Procedure: ESOPHAGOGASTRODUODENOSCOPY (EGD);  Surgeon: Carol Ada, MD;  Location: [place];  Service: Endoscopy;  Laterality: N/A;  .  GIVENS CAPSULE STUDY N/A 05/27/2016   No source identified.  Repeat 10/2016--results pending.  Procedure: GIVENS CAPSULE STUDY;  Surgeon: Carol Ada, MD;  Location: [facility] ENDOSCOPY;  Service: Endoscopy;  Laterality: N/A;  . GIVENS CAPSULE STUDY N/A 10/15/2016   Procedure: GIVENS CAPSULE STUDY;  Surgeon: Carol Ada, MD;  Location: [facility] ENDOSCOPY;  Service: Endoscopy;  Laterality: N/A;  . GIVENS CAPSULE STUDY N/A 02/13/2017   Procedure: GIVENS CAPSULE STUDY;  Surgeon: Carol Ada, MD;  Location: [place];  Service: Endoscopy;  Laterality: N/A;  . NECK SURGERY  1991  . NM GI BLOOD LOSS  01/27/2017   NEGATIVE  . SMALL BOWEL ENTEROSCOPY  10/2016   ([facility], Dr. [name]: Double balloon enteroscopy--to the level of proximal ileum) jejunal polyps- which were removed--not bleeding & benign.  No source of bleeding was identified.  . TRANSTHORACIC ECHOCARDIOGRAM  05/25/2016; 10/2017   05/2016: EF 60%, normal wall motion, grd II DD, mild aortic stenosis, dilated aortic root and ascending aorta. 10/2017: Mild LVH; no mention made of LV function.  Moderate aorticstenosis with mean gradient 19 peak gradient 41. AVA 1.1 cm^2.  Mild ascending aorta dilation.  Mean gradient slightly lower than in 2018.  [name] VASECTOMY  1977    Family History  Problem Relation Age of Onset  . Hypertension Mother   . Cancer - Other Mother        liver cancer  . Heart disease Father   . Heart attack Father   . Cancer - Lung Father   . Diabetes Father   . Liver disease Sister   . Anemia Sister   . Heart disease Sister   . Heart attack Sister   . Heart disease Brother        CABG 20 YEARS AGO  . Hypertension Brother   . Mesothelioma Brother        HALF-BROTHER  . Cancer - Other Brother        CLL  . Cancer - Lung Brother        Mets to brain  . Cancer - Lung Sister        HALF-SISTER  . Liver disease Brother   . Heart disease Brother        CABG-2012  . Emphysema Maternal Grandfather   . Cancer - Other Paternal Grandmother         Stomach  . Heart attack Paternal Grandfather      Current Outpatient Medications:  .  fluticasone (FLONASE) 50 MCG/ACT nasal spray, Place 2 sprays into both nostrils daily. (Patient taking differently: Place 2 sprays into both nostrils daily as needed for allergies. ), Disp: 16 g, Rfl: 6 .  lactulose (CHRONULAC) 10 GM/15ML solution, Take 45 mLs (30 g total) by mouth 2 (two) times daily as needed for mild constipation. Take this until you have three BM's a day., Disp: , Rfl:  .  metoprolol tartrate (LOPRESSOR) 25 MG tablet, TAKE 1/2 TABLET BY MOUTH TWICE A DAY (Patient taking differently: Take 12.5 mg by mouth 2 (two) times daily. ), Disp: 30 tablet, Rfl: 5 .  ondansetron (ZOFRAN) 4 MG tablet, Take 1 tablet (4 mg total) by mouth every 8 (eight) hours as needed for nausea or vomiting., Disp: 10 tablet, Rfl: 1 .  pantoprazole (PROTONIX) 40 MG tablet, TAKE 1 TABLET BY MOUTH TWICE DAILY BEFORE A MEAL, Disp: 60 tablet, Rfl: 3 .  potassium chloride SA (K-DUR,KLOR-CON) 20 MEQ tablet, TAKE 1 TABLET (20 MEQ TOTAL) BY MOUTH TWICE A DAY (Patient taking differently: Take 20 mEq by mouth daily. ), Disp: 60 tablet, Rfl: 6 .  spironolactone (ALDACTONE) 25 MG tablet, Take 12.5 mg by mouth daily., Disp: , Rfl:  .  torsemide (DEMADEX) 20 MG tablet, TAKE 2 TABLETS BY MOUTH DAILY. (Patient taking differently: Take 40 mg by mouth daily. ), Disp: 60 tablet, Rfl: 3 .  tranexamic acid (LYSTEDA) 650 MG TABS tablet, TAKE 2 TABLETS BY MOUTH 3 TIMES DAILY., Disp: 180 tablet, Rfl: 1 No current facility-administered medications for this visit.   Facility-Administered Medications Ordered in Other Visits:  .  heparin lock flush 100 unit/mL, 500 Units, Intracatheter, Daily PRN, Truitt Merle, MD .  sodium chloride flush (NS) 0.9 % injection 10 mL, 10 mL, Intracatheter, PRN, Truitt Merle, MD  EXAMTonette Bihari per patient if applicable: BP 962/83 (BP Location: Left Arm, Patient Position: Sitting, Cuff Size: Large)   Pulse 68    Temp 98.6 F (37 C) (Oral)   Wt 213 lb 8 oz (96.8 kg)   SpO2 94%   BMI 31.99 kg/m    GENERAL: alert, oriented, appears well and in no acute  distress  HEENT: atraumatic, conjunttiva clear, no obvious abnormalities on inspection of external nose and ears No obvious icterus or jaundice.  NECK: normal movements of the head and neck  LUNGS: on inspection no signs of respiratory distress, breathing rate appears normal, no obvious gross SOB, gasping or wheezing  CV: no obvious cyanosis  MS: moves all visible extremities without noticeable abnormality  PSYCH/NEURO: pleasant and cooperative, no obvious depression or anxiety, speech and thought processing grossly intact  LABS: none    Chemistry      Component Value Date/Time   NA 134 (L) 04/12/2018 0910   NA 139 02/10/2017 0948   K 4.1 04/12/2018 0910   K 3.9 02/10/2017 0948   CL 100 04/12/2018 0910   CO2 26 04/12/2018 0910   CO2 24 02/10/2017 0948   BUN 15 04/12/2018 0910   BUN 10.4 02/10/2017 0948   CREATININE 1.30 (H) 04/12/2018 0910   CREATININE 1.09 03/14/2018 0928   CREATININE 1.3 02/10/2017 0948      Component Value Date/Time   CALCIUM 8.7 (L) 04/12/2018 0910   CALCIUM 8.9 02/10/2017 0948   ALKPHOS 111 04/12/2018 0910   ALKPHOS 108 02/10/2017 0948   AST 58 (H) 04/12/2018 0910   AST 50 (H) 03/14/2018 0928   AST 30 02/10/2017 0948   ALT 34 04/12/2018 0910   ALT 39 03/14/2018 0928   ALT 23 02/10/2017 0948   BILITOT 9.8 (H) 04/12/2018 0910   BILITOT 7.8 (HH) 03/14/2018 0928   BILITOT 4.97 (HH) 02/10/2017 0948     Lab Results  Component Value Date   WBC 7.0 07/09/2018   HGB 13.4 07/09/2018   HCT 40.6 07/09/2018   MCV 96.0 07/09/2018   PLT 93 (L) 07/09/2018   Lab Results  Component Value Date   IRON 61 05/10/2018   TIBC 310 05/10/2018   FERRITIN 177 07/09/2018    ASSESSMENT AND PLAN:  Discussed the following assessment and plan:  1) HTN: The current medical regimen is effective;  continue present  plan and medications. Will get records of recent lytes/cr from his visit at [facility] 3 wks ago.  2) Liver failure from nonalcoholic cirrhosis: stable. On transplant list.  Per pt his labs were stable at [facility] 3 wks ago--will obtain records.  3) Recurrent/ongoing GI bleeding, unknown source: this has been stable lately on monthly iron infusions, lysteda (dose decreased to 1 of the 650 mg tabs bid by Dr. Irene Limbo recently), and sandostatin monthly.  4) AAA, with atherosclerotic ulceration per pt/wife report.   [facility] detected this about 3 wks ago at a f/u imaging done there. He now has plans for endovascular aneurism repair at [facility] 08/23/2018 by Dr. Kathyrn Sheriff.  I RF'd all pt's routine meds today, 3 mo supply with 1 RF. I did not do any labs today. Will obtain most recent [facility] records.   I discussed the assessment and treatment plan with the patient. The patient was provided an opportunity to ask questions and all were answered. The patient agreed with the plan and demonstrated an understanding of the instructions.   The patient was advised to call back or seek an in-person evaluation if the symptoms worsen or if the condition fails to improve as anticipated.  F/U: 3 mo  Signed:  Crissie Sickles, MD           08/06/2018

## 2018-08-13 ENCOUNTER — Other Ambulatory Visit: Payer: Self-pay

## 2018-08-13 ENCOUNTER — Inpatient Hospital Stay: Payer: 59

## 2018-08-13 ENCOUNTER — Inpatient Hospital Stay: Payer: 59 | Attending: Hematology

## 2018-08-13 VITALS — BP 118/67 | HR 53 | Temp 97.6°F | Resp 18

## 2018-08-13 DIAGNOSIS — D500 Iron deficiency anemia secondary to blood loss (chronic): Secondary | ICD-10-CM | POA: Diagnosis not present

## 2018-08-13 DIAGNOSIS — I77810 Thoracic aortic ectasia: Secondary | ICD-10-CM | POA: Insufficient documentation

## 2018-08-13 DIAGNOSIS — K922 Gastrointestinal hemorrhage, unspecified: Secondary | ICD-10-CM | POA: Diagnosis not present

## 2018-08-13 LAB — CBC WITH DIFFERENTIAL/PLATELET
Abs Immature Granulocytes: 0.02 10*3/uL (ref 0.00–0.07)
Basophils Absolute: 0.1 10*3/uL (ref 0.0–0.1)
Basophils Relative: 2 %
Eosinophils Absolute: 1 10*3/uL — ABNORMAL HIGH (ref 0.0–0.5)
Eosinophils Relative: 17 %
HCT: 38.7 % — ABNORMAL LOW (ref 39.0–52.0)
Hemoglobin: 13.2 g/dL (ref 13.0–17.0)
Immature Granulocytes: 0 %
Lymphocytes Relative: 16 %
Lymphs Abs: 1 10*3/uL (ref 0.7–4.0)
MCH: 32.3 pg (ref 26.0–34.0)
MCHC: 34.1 g/dL (ref 30.0–36.0)
MCV: 94.6 fL (ref 80.0–100.0)
Monocytes Absolute: 0.8 10*3/uL (ref 0.1–1.0)
Monocytes Relative: 13 %
Neutro Abs: 3.2 10*3/uL (ref 1.7–7.7)
Neutrophils Relative %: 52 %
Platelets: 95 10*3/uL — ABNORMAL LOW (ref 150–400)
RBC: 4.09 MIL/uL — ABNORMAL LOW (ref 4.22–5.81)
RDW: 17 % — ABNORMAL HIGH (ref 11.5–15.5)
WBC: 6 10*3/uL (ref 4.0–10.5)
nRBC: 0 % (ref 0.0–0.2)

## 2018-08-13 LAB — FERRITIN: Ferritin: 214 ng/mL (ref 24–336)

## 2018-08-13 MED ORDER — OCTREOTIDE ACETATE 30 MG IM KIT
30.0000 mg | PACK | Freq: Once | INTRAMUSCULAR | Status: AC
  Administered 2018-08-13: 30 mg via INTRAMUSCULAR

## 2018-08-13 MED ORDER — SODIUM CHLORIDE 0.9 % IV SOLN
750.0000 mg | Freq: Once | INTRAVENOUS | Status: AC
  Administered 2018-08-13: 750 mg via INTRAVENOUS
  Filled 2018-08-13: qty 15

## 2018-08-13 MED ORDER — OCTREOTIDE ACETATE 30 MG IM KIT
PACK | INTRAMUSCULAR | Status: AC
  Filled 2018-08-13: qty 1

## 2018-08-13 MED ORDER — SODIUM CHLORIDE 0.9 % IV SOLN
INTRAVENOUS | Status: DC
  Administered 2018-08-13: 09:00:00 via INTRAVENOUS
  Filled 2018-08-13: qty 250

## 2018-08-13 NOTE — Patient Instructions (Signed)
Ferric carboxymaltose injection What is this medicine? FERRIC CARBOXYMALTOSE (ferr-ik car-box-ee-mol-toes) is an iron complex. Iron is used to make healthy red blood cells, which carry oxygen and nutrients throughout the body. This medicine is used to treat anemia in people with chronic kidney disease or people who cannot take iron by mouth. This medicine may be used for other purposes; ask your health care provider or pharmacist if you have questions. COMMON BRAND NAME(S): Injectafer What should I tell my health care provider before I take this medicine? They need to know if you have any of these conditions:  high levels of iron in the blood  liver disease  an unusual or allergic reaction to iron, other medicines, foods, dyes, or preservatives  pregnant or trying to get pregnant  breast-feeding How should I use this medicine? This medicine is for infusion into a vein. It is given by a health care professional in a hospital or clinic setting. Talk to your pediatrician regarding the use of this medicine in children. Special care may be needed. Overdosage: If you think you have taken too much of this medicine contact a poison control center or emergency room at once. NOTE: This medicine is only for you. Do not share this medicine with others. What if I miss a dose? It is important not to miss your dose. Call your doctor or health care professional if you are unable to keep an appointment. What may interact with this medicine? Do not take this medicine with any of the following medications:  deferoxamine  dimercaprol  other iron products This list may not describe all possible interactions. Give your health care provider a list of all the medicines, herbs, non-prescription drugs, or dietary supplements you use. Also tell them if you smoke, drink alcohol, or use illegal drugs. Some items may interact with your medicine. What should I watch for while using this medicine? Visit your  doctor or health care professional regularly. Tell your doctor if your symptoms do not start to get better or if they get worse. You may need blood work done while you are taking this medicine. You may need to follow a special diet. Talk to your doctor. Foods that contain iron include: whole grains/cereals, dried fruits, beans, or peas, leafy green vegetables, and organ meats (liver, kidney). What side effects may I notice from receiving this medicine? Side effects that you should report to your doctor or health care professional as soon as possible:  allergic reactions like skin rash, itching or hives, swelling of the face, lips, or tongue  dizziness  facial flushing Side effects that usually do not require medical attention (report to your doctor or health care professional if they continue or are bothersome):  changes in taste  constipation  headache  nausea, vomiting  pain, redness, or irritation at site where injected This list may not describe all possible side effects. Call your doctor for medical advice about side effects. You may report side effects to FDA at 1-800-FDA-1088. Where should I keep my medicine? This drug is given in a hospital or clinic and will not be stored at home. NOTE: This sheet is a summary. It may not cover all possible information. If you have questions about this medicine, talk to your doctor, pharmacist, or health care provider.  2020 Elsevier/Gold Standard (2016-03-10 09:40:29)   Octreotide injection solution What is this medicine? OCTREOTIDE (ok TREE oh tide) is used to reduce blood levels of growth hormone in patients with a condition called acromegaly. This  medicine also reduces flushing and watery diarrhea caused by certain types of cancer. This medicine may be used for other purposes; ask your health care provider or pharmacist if you have questions. COMMON BRAND NAME(S): Bynfezia, Sandostatin, Sandostatin LAR What should I tell my health care  provider before I take this medicine? They need to know if you have any of these conditions:  diabetes  gallbladder disease  kidney disease  liver disease  an unusual or allergic reaction to octreotide, other medicines, foods, dyes, or preservatives  pregnant or trying to get pregnant  breast-feeding How should I use this medicine? This medicine is for injection under the skin or into a vein (only in emergency situations). It is usually given by a health care professional in a hospital or clinic setting. If you get this medicine at home, you will be taught how to prepare and give this medicine. Allow the injection solution to come to room temperature before use. Do not warm it artificially. Use exactly as directed. Take your medicine at regular intervals. Do not take your medicine more often than directed. It is important that you put your used needles and syringes in a special sharps container. Do not put them in a trash can. If you do not have a sharps container, call your pharmacist or healthcare provider to get one. Talk to your pediatrician regarding the use of this medicine in children. Special care may be needed. Overdosage: If you think you have taken too much of this medicine contact a poison control center or emergency room at once. NOTE: This medicine is only for you. Do not share this medicine with others. What if I miss a dose? If you miss a dose, take it as soon as you can. If it is almost time for your next dose, take only that dose. Do not take double or extra doses. What may interact with this medicine? Do not take this medicine with any of the following medications:  cisapride  droperidol  general anesthetics  grepafloxacin  perphenazine  thioridazine This medicine may also interact with the following medications:  bromocriptine  cyclosporine  diuretics  medicines for blood pressure, heart disease, irregular heart beat  medicines for diabetes,  including insulin  quinidine This list may not describe all possible interactions. Give your health care provider a list of all the medicines, herbs, non-prescription drugs, or dietary supplements you use. Also tell them if you smoke, drink alcohol, or use illegal drugs. Some items may interact with your medicine. What should I watch for while using this medicine? Visit your doctor or health care professional for regular checks on your progress. To help reduce irritation at the injection site, use a different site for each injection and make sure the solution is at room temperature before use. This medicine may cause decreases in blood sugar. Signs of low blood sugar include chills, cool, pale skin or cold sweats, drowsiness, extreme hunger, fast heartbeat, headache, nausea, nervousness or anxiety, shakiness, trembling, unsteadiness, tiredness, or weakness. Contact your doctor or health care professional right away if you experience any of these symptoms. This medicine may increase blood sugar. Ask your healthcare provider if changes in diet or medicines are needed if you have diabetes. This medicine may cause a decrease in vitamin B12. You should make sure that you get enough vitamin B12 while you are taking this medicine. Discuss the foods you eat and the vitamins you take with your health care professional. What side effects may I notice from  receiving this medicine? Side effects that you should report to your doctor or health care professional as soon as possible:  allergic reactions like skin rash, itching or hives, swelling of the face, lips, or tongue  decreases in blood sugar  changes in heart rate  severe stomach pain   signs and symptoms of high blood sugar such as being more thirsty or hungry or having to urinate more than normal. You may also feel very tired or have blurry vision. Side effects that usually do not require medical attention (report to your doctor or health care  professional if they continue or are bothersome):  diarrhea or constipation  gas or stomach pain  nausea, vomiting  pain, redness, swelling and irritation at site where injected This list may not describe all possible side effects. Call your doctor for medical advice about side effects. You may report side effects to FDA at 1-800-FDA-1088. Where should I keep my medicine? Keep out of the reach of children. Store in a refrigerator between 2 and 8 degrees C (36 and 46 degrees F). Protect from light. Allow to come to room temperature naturally. Do not use artificial heat. If protected from light, the injection may be stored at room temperature between 20 and 30 degrees C (70 and 86 degrees F) for 14 days. After the initial use, throw away any unused portion of a multiple dose vial after 14 days. Throw away unused portions of the ampules after use. NOTE: This sheet is a summary. It may not cover all possible information. If you have questions about this medicine, talk to your doctor, pharmacist, or health care provider.  2020 Elsevier/Gold Standard (2017-11-02 08:07:09)   Coronavirus (COVID-19) Are you at risk?  Are you at risk for the Coronavirus (COVID-19)?  To be considered HIGH RISK for Coronavirus (COVID-19), you have to meet the following criteria:  . Traveled to [place], [name], [place], [place] or [place]; or in the [place] to [place], [place], [place], or [place]; and have fever, cough, and shortness of breath within the last 2 weeks of travel OR . Been in close contact with a person diagnosed with COVID-19 within the last 2 weeks and have fever, cough, and shortness of breath . IF YOU DO NOT MEET THESE CRITERIA, YOU ARE CONSIDERED LOW RISK FOR COVID-19.  What to do if you are HIGH RISK for COVID-19?  [name] If you are having a medical emergency, call 911. . Seek medical care right away. Before you go to a doctor's office, urgent care or emergency department, call ahead  and tell them about your recent travel, contact with someone diagnosed with COVID-19, and your symptoms. You should receive instructions from your physician's office regarding next steps of care.  . When you arrive at healthcare provider, tell the healthcare staff immediately you have returned from visiting [place], [place], [name], [place] or [place]; or traveled in the [place] to [place], [place], [place], or [place]; in the last two weeks or you have been in close contact with a person diagnosed with COVID-19 in the last 2 weeks.   . Tell the health care staff about your symptoms: fever, cough and shortness of breath. . After you have been seen by a medical provider, you will be either: o Tested for (COVID-19) and discharged home on quarantine except to seek medical care if symptoms worsen, and asked to  - Stay home and avoid contact with others until you get your results (4-5  days)  - Avoid travel on public transportation if possible (such as bus, train, or airplane) or o Sent to the Emergency Department by EMS for evaluation, COVID-19 testing, and possible admission depending on your condition and test results.  What to do if you are LOW RISK for COVID-19?  Reduce your risk of any infection by using the same precautions used for avoiding the common cold or flu:  [name] Wash your hands often with soap and warm water for at least 20 seconds.  If soap and water are not readily available, use an alcohol-based hand sanitizer with at least 60% alcohol.  . If coughing or sneezing, cover your mouth and nose by coughing or sneezing into the elbow areas of your shirt or coat, into a tissue or into your sleeve (not your hands). . Avoid shaking hands with others and consider head nods or verbal greetings only. . Avoid touching your eyes, nose, or mouth with unwashed hands.  . Avoid close contact with people who are sick. . Avoid places or events with large numbers of people in one location, like  concerts or sporting events. . Carefully consider travel plans you have or are making. . If you are planning any travel outside or inside the [place], visit the CDC's Travelers' Health webpage for the latest health notices. . If you have some symptoms but not all symptoms, continue to monitor at home and seek medical attention if your symptoms worsen. . If you are having a medical emergency, call 911.   [place] / e-Visit: [email or web address]         [facility]: [facility]: [phone]                   [facility]: [phone]

## 2018-08-13 NOTE — Progress Notes (Signed)
Patient refused 30 minutes post observation after injectafer infusion. Verbal education provided. Vitals obtained and stabled. Denies any complain or concerns.

## 2018-08-15 DIAGNOSIS — C4490 Unspecified malignant neoplasm of skin, unspecified: Secondary | ICD-10-CM

## 2018-08-15 DIAGNOSIS — C44321 Squamous cell carcinoma of skin of nose: Secondary | ICD-10-CM | POA: Diagnosis not present

## 2018-08-15 HISTORY — DX: Unspecified malignant neoplasm of skin, unspecified: C44.90

## 2018-08-15 MED FILL — GENERLAC 10 GM/15 ML SOLN: 10 | 30 days supply | Qty: 2700 | Fill #0

## 2018-08-17 ENCOUNTER — Other Ambulatory Visit (HOSPITAL_COMMUNITY)
Admission: RE | Admit: 2018-08-17 | Discharge: 2018-08-17 | Disposition: A | Discharge status: Home / Self Care | Payer: 59 | Source: Ambulatory Visit | Attending: Gastroenterology | Admitting: Gastroenterology

## 2018-08-17 DIAGNOSIS — Z1159 Encounter for screening for other viral diseases: Secondary | ICD-10-CM | POA: Insufficient documentation

## 2018-08-17 DIAGNOSIS — Z01812 Encounter for preprocedural laboratory examination: Secondary | ICD-10-CM | POA: Diagnosis not present

## 2018-08-17 LAB — SARS CORONAVIRUS 2 (TAT 6-24 HRS): SARS Coronavirus 2: NEGATIVE

## 2018-08-20 ENCOUNTER — Encounter (HOSPITAL_COMMUNITY): Payer: Self-pay | Admitting: *Deleted

## 2018-08-20 ENCOUNTER — Other Ambulatory Visit: Payer: Self-pay

## 2018-08-21 ENCOUNTER — Encounter (HOSPITAL_COMMUNITY)
Admission: RE | Disposition: A | Discharge status: Home / Self Care | Payer: Self-pay | Source: Home / Self Care | Attending: Gastroenterology

## 2018-08-21 ENCOUNTER — Encounter (HOSPITAL_COMMUNITY): Payer: Self-pay

## 2018-08-21 ENCOUNTER — Ambulatory Visit (HOSPITAL_COMMUNITY)
Admission: RE | Admit: 2018-08-21 | Discharge: 2018-08-21 | Disposition: A | Discharge status: Home / Self Care | Payer: 59 | Attending: Gastroenterology | Admitting: Gastroenterology

## 2018-08-21 ENCOUNTER — Ambulatory Visit (HOSPITAL_COMMUNITY): Payer: 59 | Admitting: Certified Registered"

## 2018-08-21 ENCOUNTER — Other Ambulatory Visit: Payer: Self-pay

## 2018-08-21 DIAGNOSIS — J8410 Pulmonary fibrosis, unspecified: Secondary | ICD-10-CM | POA: Diagnosis not present

## 2018-08-21 DIAGNOSIS — I5032 Chronic diastolic (congestive) heart failure: Secondary | ICD-10-CM | POA: Insufficient documentation

## 2018-08-21 DIAGNOSIS — I110 Hypertensive heart disease with heart failure: Secondary | ICD-10-CM | POA: Diagnosis not present

## 2018-08-21 DIAGNOSIS — I8510 Secondary esophageal varices without bleeding: Secondary | ICD-10-CM | POA: Diagnosis not present

## 2018-08-21 DIAGNOSIS — K3189 Other diseases of stomach and duodenum: Secondary | ICD-10-CM | POA: Diagnosis not present

## 2018-08-21 DIAGNOSIS — I2510 Atherosclerotic heart disease of native coronary artery without angina pectoris: Secondary | ICD-10-CM | POA: Diagnosis not present

## 2018-08-21 DIAGNOSIS — E119 Type 2 diabetes mellitus without complications: Secondary | ICD-10-CM | POA: Insufficient documentation

## 2018-08-21 DIAGNOSIS — E669 Obesity, unspecified: Secondary | ICD-10-CM | POA: Diagnosis not present

## 2018-08-21 DIAGNOSIS — Z6831 Body mass index (BMI) 31.0-31.9, adult: Secondary | ICD-10-CM | POA: Diagnosis not present

## 2018-08-21 DIAGNOSIS — Z79899 Other long term (current) drug therapy: Secondary | ICD-10-CM | POA: Diagnosis not present

## 2018-08-21 DIAGNOSIS — I8500 Esophageal varices without bleeding: Secondary | ICD-10-CM | POA: Diagnosis not present

## 2018-08-21 DIAGNOSIS — K219 Gastro-esophageal reflux disease without esophagitis: Secondary | ICD-10-CM | POA: Insufficient documentation

## 2018-08-21 DIAGNOSIS — K922 Gastrointestinal hemorrhage, unspecified: Secondary | ICD-10-CM | POA: Diagnosis not present

## 2018-08-21 DIAGNOSIS — K7290 Hepatic failure, unspecified without coma: Secondary | ICD-10-CM | POA: Diagnosis not present

## 2018-08-21 DIAGNOSIS — Z7682 Awaiting organ transplant status: Secondary | ICD-10-CM | POA: Diagnosis not present

## 2018-08-21 DIAGNOSIS — I714 Abdominal aortic aneurysm, without rupture: Secondary | ICD-10-CM | POA: Insufficient documentation

## 2018-08-21 DIAGNOSIS — K7460 Unspecified cirrhosis of liver: Secondary | ICD-10-CM | POA: Diagnosis not present

## 2018-08-21 DIAGNOSIS — Z951 Presence of aortocoronary bypass graft: Secondary | ICD-10-CM | POA: Insufficient documentation

## 2018-08-21 DIAGNOSIS — K7581 Nonalcoholic steatohepatitis (NASH): Secondary | ICD-10-CM | POA: Insufficient documentation

## 2018-08-21 DIAGNOSIS — D500 Iron deficiency anemia secondary to blood loss (chronic): Secondary | ICD-10-CM | POA: Insufficient documentation

## 2018-08-21 DIAGNOSIS — K766 Portal hypertension: Secondary | ICD-10-CM | POA: Diagnosis not present

## 2018-08-21 HISTORY — PX: ESOPHAGOGASTRODUODENOSCOPY (EGD) WITH PROPOFOL: SHX5813

## 2018-08-21 SURGERY — ESOPHAGOGASTRODUODENOSCOPY (EGD) WITH PROPOFOL
Anesthesia: Monitor Anesthesia Care

## 2018-08-21 MED ORDER — SODIUM CHLORIDE 0.9 % IV SOLN: INTRAVENOUS | Status: DC

## 2018-08-21 MED ORDER — PROPOFOL 10 MG/ML IV BOLUS
INTRAVENOUS | Status: AC
  Filled 2018-08-21: qty 40

## 2018-08-21 MED ORDER — PROPOFOL 10 MG/ML IV BOLUS
INTRAVENOUS | Status: DC | PRN
  Administered 2018-08-21: 10 mg via INTRAVENOUS
  Administered 2018-08-21: 20 mg via INTRAVENOUS

## 2018-08-21 MED ORDER — LACTATED RINGERS IV SOLN
INTRAVENOUS | Status: DC
  Administered 2018-08-21: 11:00:00 via INTRAVENOUS

## 2018-08-21 MED ORDER — PROPOFOL 500 MG/50ML IV EMUL
INTRAVENOUS | Status: DC | PRN
  Administered 2018-08-21: 110 ug/kg/min via INTRAVENOUS

## 2018-08-21 SURGICAL SUPPLY — 15 items

## 2018-08-21 NOTE — Anesthesia Procedure Notes (Signed)
Procedure Name: MAC Date/Time: 08/21/2018 12:36 PM Performed by: Niel Hummer, CRNA Pre-anesthesia Checklist: Patient identified, Emergency Drugs available, Suction available and Patient being monitored Patient Re-evaluated:Patient Re-evaluated prior to induction Oxygen Delivery Method: Nasal cannula

## 2018-08-21 NOTE — Anesthesia Postprocedure Evaluation (Signed)
Anesthesia Post Note  Patient: William [name]  Procedure(s) Performed: ESOPHAGOGASTRODUODENOSCOPY (EGD) WITH PROPOFOL (N/A )     Patient location during evaluation: Endoscopy Anesthesia Type: MAC Level of consciousness: awake and alert Pain management: pain level controlled Vital Signs Assessment: post-procedure vital signs reviewed and stable Respiratory status: spontaneous breathing, nonlabored ventilation and respiratory function stable Cardiovascular status: stable and blood pressure returned to baseline Postop Assessment: no apparent nausea or vomiting Anesthetic complications: no    Last Vitals:  Vitals:   08/21/18 1301 08/21/18 1310  BP: 105/61 (!) 109/58  Pulse: (!) 55 (!) 55  Resp: 13   Temp: 37 C   SpO2: 96% 94%    Last Pain:  Vitals:   08/21/18 1310  TempSrc:   PainSc: 0-No pain                 Lynda Rainwater

## 2018-08-21 NOTE — Anesthesia Preprocedure Evaluation (Signed)
Anesthesia Evaluation  Patient identified by MRN, date of birth, ID band Patient awake    Reviewed: Allergy & Precautions, H&P , NPO status , Patient's Chart, lab work & pertinent test results, reviewed documented beta blocker date and time   Airway Mallampati: I  TM Distance: >3 FB Neck ROM: full    Dental  (+) Teeth Intact, Dental Advidsory Given   Pulmonary former smoker,    breath sounds clear to auscultation       Cardiovascular Exercise Tolerance: Good hypertension, On Medications + CAD, + CABG (2008) and + Peripheral Vascular Disease (AAA)   Rhythm:Regular + Systolic murmurs HLD  TTE 2018 - LVEF 60-65%, mild LVH, normal wall motion, grade 2 DD, mild aortic stenosis - AVA around   1.8 cm2 based on LVOT diameter of 2.2 cm, 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 and ascending aorta to 4.2 cm, trivial MR, mild biatrial enlargement, mild TR, RVSP 28 mmHg  EKG - inc RBBB   Neuro/Psych negative neurological ROS  negative psych ROS   GI/Hepatic GERD  Controlled and Medicated,(+) Cirrhosis       , Hepatitis -  Endo/Other  diabetes, Well Controlled, Type 2  Renal/GU Renal disease (b/l cysts)     Musculoskeletal negative musculoskeletal ROS (+)   Abdominal   Peds  Hematology  (+) anemia ,   Anesthesia Other Findings   Reproductive/Obstetrics                             Anesthesia Physical  Anesthesia Plan  ASA: III  Anesthesia Plan: MAC   Post-op Pain Management:    Induction: Intravenous  PONV Risk Score and Plan: 1 and Propofol infusion and Treatment may vary due to age or medical condition  Airway Management Planned: Nasal Cannula  Additional Equipment: None  Intra-op Plan:   Post-operative Plan:   Informed Consent: I have reviewed the patients History and Physical, chart, labs and discussed the procedure including the risks, benefits and alternatives for the proposed anesthesia  with the patient or authorized representative who has indicated his/her understanding and acceptance.     Dental Advisory Given  Plan Discussed with: CRNA  Anesthesia Plan Comments:         Anesthesia Quick Evaluation                                   Anesthesia Evaluation  Patient identified by MRN, date of birth, ID band Patient awake    Reviewed: Allergy & Precautions, NPO status , Patient's Chart, lab work & pertinent test results  Airway Mallampati: III  TM Distance: >3 FB Neck ROM: Full    Dental no notable dental hx.    Pulmonary neg pulmonary ROS, former smoker,    Pulmonary exam normal breath sounds clear to auscultation       Cardiovascular hypertension, + CAD and + Peripheral Vascular Disease  negative cardio ROS Normal cardiovascular exam Rhythm:Regular Rate:Normal     Neuro/Psych negative neurological ROS  negative psych ROS   GI/Hepatic negative GI ROS, (+) Hepatitis -  Endo/Other  diabetes, Type 2Morbid obesity  Renal/GU Renal diseasenegative Renal ROS     Musculoskeletal negative musculoskeletal ROS (+)   Abdominal (+) + obese,   Peds  Hematology negative hematology ROS (+)   Anesthesia Other Findings   Reproductive/Obstetrics negative OB ROS  Anesthesia Physical Anesthesia Plan  ASA: III  Anesthesia Plan: MAC   Post-op Pain Management:    Induction: Intravenous  Airway Management Planned:   Additional Equipment:   Intra-op Plan:   Post-operative Plan:   Informed Consent: I have reviewed the patients History and Physical, chart, labs and discussed the procedure including the risks, benefits and alternatives for the proposed anesthesia with the patient or authorized representative who has indicated his/her understanding and acceptance.   Dental advisory given  Plan Discussed with: CRNA  Anesthesia Plan Comments:         Anesthesia Quick  Evaluation

## 2018-08-21 NOTE — Discharge Instructions (Signed)

## 2018-08-21 NOTE — Interval H&P Note (Signed)
History and Physical Interval Note:  08/21/2018 12:31 PM  William [name]  has presented today for surgery, with the diagnosis of per hx cirrhosis/screening for varices.  The various methods of treatment have been discussed with the patient and family. After consideration of risks, benefits and other options for treatment, the patient has consented to  Procedure(s): ESOPHAGOGASTRODUODENOSCOPY (EGD) WITH PROPOFOL (N/A) as a surgical intervention.  The patient's history has been reviewed, patient examined, no change in status, stable for surgery.  I have reviewed the patient's chart and labs.  Questions were answered to the patient's satisfaction.     Petra Dumler D

## 2018-08-21 NOTE — Transfer of Care (Signed)
Immediate Anesthesia Transfer of Care Note  Patient: ESPIRIDION SUPINSKI  Procedure(s) Performed: ESOPHAGOGASTRODUODENOSCOPY (EGD) WITH PROPOFOL (N/A )  Patient Location: Endoscopy Unit  Anesthesia Type:MAC  Level of Consciousness: awake, oriented and patient cooperative  Airway & Oxygen Therapy: Patient Spontanous Breathing and Patient connected to nasal cannula oxygen  Post-op Assessment: Report given to RN and Post -op Vital signs reviewed and stable  Post vital signs: Reviewed and stable  Last Vitals:  Vitals Value Taken Time  BP    Temp    Pulse    Resp 11 08/21/18 1300  SpO2    Vitals shown include unvalidated device data.  Last Pain:  Vitals:   08/21/18 1119  TempSrc: Oral  PainSc: 0-No pain         Complications: No apparent anesthesia complications

## 2018-08-21 NOTE — Op Note (Signed)
[facility] Patient Name: William [name] Procedure Date: 08/21/2018 MRN: [id number] Attending MD: Carol Ada , MD Date of Birth: 08-04-1950 CSN: [id number] Age: 68 Admit Type: Inpatient Procedure:                Upper GI endoscopy Indications:              Surveillance procedure Providers:                Carol Ada, MD, Cleda Daub, RN, Ladona Ridgel, Technician Referring MD:              Medicines:                Propofol per Anesthesia Complications:            No immediate complications. Estimated Blood Loss:     Estimated blood loss: none. Procedure:                Pre-Anesthesia Assessment:                           - Prior to the procedure, a History and Physical                            was performed, and patient medications and                            allergies were reviewed. The patient's tolerance of                            previous anesthesia was also reviewed. The risks                            and benefits of the procedure and the sedation                            options and risks were discussed with the patient.                            All questions were answered, and informed consent                            was obtained. Prior Anticoagulants: The patient has                            taken no previous anticoagulant or antiplatelet                            agents. ASA Grade Assessment: III - A patient with                            severe systemic disease. After reviewing the risks                            and  benefits, the patient was deemed in                            satisfactory condition to undergo the procedure.                           - Sedation was administered by an anesthesia                            professional. Deep sedation was attained.                           After obtaining informed consent, the endoscope was                            passed under direct vision. Throughout  the                            procedure, the patient's blood pressure, pulse, and                            oxygen saturations were monitored continuously. The                            GIF-H190 ([id number]) Olympus gastroscope was                            introduced through the mouth, and advanced to the                            second part of duodenum. The upper GI endoscopy was                            accomplished without difficulty. The patient                            tolerated the procedure well. Scope In: Scope Out: Findings:      Small (< 5 mm) varices were found in the middle third of the esophagus.      Mild portal hypertensive gastropathy was found in the gastric fundus and       in the gastric body.      The examined duodenum was normal.      Very small to no esophageal varices were noted during this procedure.       There was no evidence of any fundic varices. Impression:               - Small (< 5 mm) esophageal varices.                           - Portal hypertensive gastropathy.                           - Normal examined duodenum.                           -  No specimens collected. Moderate Sedation:      Not Applicable - Patient had care per Anesthesia. Recommendation:           - Patient has a contact number available for                            emergencies. The signs and symptoms of potential                            delayed complications were discussed with the                            patient. Return to normal activities tomorrow.                            Written discharge instructions were provided to the                            patient.                           - Resume previous diet.                           - Continue present medications.                           - Repeat upper endoscopy in 3 years for                            surveillance.                           - Transplant work up per [facility]. Procedure Code(s):        ---  Professional ---                           [phone], Esophagogastroduodenoscopy, flexible,                            transoral; diagnostic, including collection of                            specimen(s) by brushing or washing, when performed                            (separate procedure) Diagnosis Code(s):        --- Professional ---                           I85.00, Esophageal varices without bleeding                           K76.6, Portal hypertension                           K31.89, Other diseases of stomach and duodenum CPT copyright 2019 American Medical  Association. All rights reserved. The codes documented in this report are preliminary and upon coder review may  be revised to meet current compliance requirements. Carol Ada, MD Carol Ada, MD 08/21/2018 1:03:30 PM This report has been signed electronically. Number of Addenda: 0

## 2018-08-22 ENCOUNTER — Encounter (HOSPITAL_COMMUNITY): Payer: Self-pay | Admitting: Gastroenterology

## 2018-08-22 DIAGNOSIS — Z01818 Encounter for other preprocedural examination: Secondary | ICD-10-CM | POA: Diagnosis not present

## 2018-08-22 DIAGNOSIS — Z1159 Encounter for screening for other viral diseases: Secondary | ICD-10-CM | POA: Diagnosis not present

## 2018-08-23 DIAGNOSIS — I110 Hypertensive heart disease with heart failure: Secondary | ICD-10-CM | POA: Diagnosis not present

## 2018-08-23 DIAGNOSIS — Z79899 Other long term (current) drug therapy: Secondary | ICD-10-CM | POA: Diagnosis not present

## 2018-08-23 DIAGNOSIS — I509 Heart failure, unspecified: Secondary | ICD-10-CM | POA: Diagnosis not present

## 2018-08-23 DIAGNOSIS — I714 Abdominal aortic aneurysm, without rupture: Secondary | ICD-10-CM | POA: Diagnosis not present

## 2018-08-23 DIAGNOSIS — Z951 Presence of aortocoronary bypass graft: Secondary | ICD-10-CM | POA: Diagnosis not present

## 2018-08-23 DIAGNOSIS — I2510 Atherosclerotic heart disease of native coronary artery without angina pectoris: Secondary | ICD-10-CM | POA: Diagnosis not present

## 2018-08-23 HISTORY — PX: ABDOMINAL AORTIC ANEURYSM REPAIR: SHX42

## 2018-08-24 MED ORDER — FLUTICASONE PROPIONATE 50 MCG/ACT NA SUSP
2.00 | NASAL | Status: DC
Start: ? — End: 2018-08-24

## 2018-08-24 MED ORDER — SPIRONOLACTONE 25 MG PO TABS
12.50 | ORAL_TABLET | ORAL | Status: DC
Start: 2018-08-25 — End: 2018-08-24

## 2018-08-24 MED ORDER — HEPARIN SODIUM (PORCINE) 5000 UNIT/ML IJ SOLN
5000.00 | INTRAMUSCULAR | Status: DC
Start: 2018-08-24 — End: 2018-08-24

## 2018-08-24 MED ORDER — LACTULOSE 10 GM/15ML PO SOLN
20.00 | ORAL | Status: DC
Start: 2018-08-24 — End: 2018-08-24

## 2018-08-24 MED ORDER — ONDANSETRON HCL 4 MG PO TABS
4.00 | ORAL_TABLET | ORAL | Status: DC
Start: ? — End: 2018-08-24

## 2018-08-24 MED ORDER — GENERIC EXTERNAL MEDICATION
12.50 | Status: DC
Start: 2018-08-24 — End: 2018-08-24

## 2018-08-24 MED ORDER — ACETAMINOPHEN 325 MG PO TABS
650.00 | ORAL_TABLET | ORAL | Status: DC
Start: ? — End: 2018-08-24

## 2018-08-24 MED ORDER — PANTOPRAZOLE SODIUM 40 MG PO TBEC
40.00 | DELAYED_RELEASE_TABLET | ORAL | Status: DC
Start: 2018-08-24 — End: 2018-08-24

## 2018-08-24 MED ORDER — TORSEMIDE 20 MG PO TABS
40.00 | ORAL_TABLET | ORAL | Status: DC
Start: 2018-08-25 — End: 2018-08-24

## 2018-08-24 MED FILL — oxyCODONE HCL 5 MG TABS: 5 | 2 days supply | Qty: 10 | Fill #0

## 2018-08-27 ENCOUNTER — Other Ambulatory Visit: Payer: Self-pay | Admitting: *Deleted

## 2018-08-27 ENCOUNTER — Encounter: Payer: Self-pay | Admitting: Family Medicine

## 2018-08-27 ENCOUNTER — Telehealth: Payer: Self-pay

## 2018-08-27 NOTE — Telephone Encounter (Signed)
LM for pt or pt's wife to return call.

## 2018-08-27 NOTE — Telephone Encounter (Signed)
Pt's wife returned call but I was unable to get through to office

## 2018-08-27 NOTE — Patient Outreach (Signed)
[facility]) Care Management  08/27/2018  William [name] March 22, 1950 [id number]   Transition of care call   Referral received : Initial call date : 08/27/18 Insurance ; [place] Plan   Subjective : Initial outreach call to patient , explained purpose for the call ; 2 HIPAA identifiers verified. Transition of care completed . Patient states that he is doing pretty good. Patient reports tolerating diet fairly well, following a low salt diet. Patient discussed tolerating walking around home,he denies use of cane or walker just takes his time. Patient discussed groin sites looks good, tolerating taking a shower with help of his wife. Patient reports taking tylenol as needed for pain control, but has oxycodone on hand if needed.   Patient requested that I speak with his wife William [name] that is a Marine scientist and helps him with medical plan .Wife discussed problem with aneurysm was found on one of the evaluations patient was undergoing as part of liver transplant workup. William [name] discussed patient with episode over the weekend  of elevated temperature 101, she followed up with surgeon at [facility] that suggested follow up with PCP. She contacted PCP weekend on call service  office that recommended patient go to [facility] long ED. Patient wife suspected elevation in temperature may be related to recently having a foley catheter removed, she increased patient fluids and noted urine clearing and decreased temperature . She denies patient having elevation in temperature over 99 in the last day and states she will notify PCP if problem or signs of infection.  Wife discussed that patient monitors his weight daily and states she understands when to notify MD of sudden weight gain or worsening symptoms ,  and they monitor blood pressure sporadically in home.   Objective  William [name] was hospitalized at [facility] from 7/16-7/17 for  Endovascular repair of Aorto/iliac artery , deployment of aorta-bi-iliac endograft.   CoMorbidites : chronic diastolic heart failure, chronic GI bleed, nonalcoholic cirrhosis, chronic  liver disease,  type 2 Diabetes, CAD history of CABG, mixed hyperlipidemia, iron deficiency anemia due to chronic blood loss. Patient is undergoing pre evaluations for consideration for    liver transplant  Patient was discharged home without need for home health services .  Assessment Patient wife voices good understanding of discharge instructions, and denies needs for education handouts.  See Transition of care assessment for details.  Patient wife has contacted [facility] for post hospital follow up visit and awaiting return call.  Plan Reviewed discharge diagnosis of endo vascular repair of aneurysm plan using care everywhere for discharge instructions assessing medication adherence, reviewed post operative problems requiring provider notification, discussing importance of follow up with surgeon, primary care and specialist as directed.  No ongoing care management needs identified so will close case to [place] Management services. Will send successful outreach letter to patient.   Joylene Draft, RN, [place] Management Coordinator  [phone]- Mobile [phone]- Toll Free Main Office

## 2018-08-27 NOTE — Telephone Encounter (Signed)
Pt's wife, [name] called over the weekend stating pt had endovascular aortic repair on Tuesday and was running a fever of 101.7 and had some n/v. His incision looks okay but bruised. Surgical resident that was on call advised them to call PCP due to trouble removing catheter and possible UTI. Pt is having urinary frequency.

## 2018-08-27 NOTE — Telephone Encounter (Signed)
Pt's wife sent MyChart message after attempting to return my call. Message was sent to PCP as FYI.

## 2018-08-29 DIAGNOSIS — J181 Lobar pneumonia, unspecified organism: Secondary | ICD-10-CM | POA: Diagnosis not present

## 2018-09-03 ENCOUNTER — Other Ambulatory Visit: Payer: 59

## 2018-09-03 ENCOUNTER — Ambulatory Visit: Payer: 59

## 2018-09-14 NOTE — Progress Notes (Signed)
HEMATOLOGY/ONCOLOGY CLINIC NOTE  Date of Service: 09/17/18  Patient Care Team: Tammi Sou, MD as PCP - General (Family Medicine) Troy Sine, MD as PCP - Cardiology (Cardiology) Franchot Gallo, MD as Consulting Physician (Urology) Troy Sine, MD as Consulting Physician (Cardiology) Brunetta Genera, MD as Consulting Physician (Hematology) [name], Gaspar Skeeters, MD as Consulting Physician (Gastroenterology) Carol Ada, MD as Consulting Physician (Gastroenterology) Rege, Dwain Sarna, MD as Consulting Physician (General Surgery) Lovenia Shuck, MD as Consulting Physician (Cardiothoracic Surgery)  CHIEF COMPLAINTS/PURPOSE OF CONSULTATION:   F/u for anemia and ongoing GI bleeding.  HISTORY OF PRESENTING ILLNESS:   William [name] is a wonderful 68 y.o. William who has been referred to [place] by Dr .Anitra Lauth, Adrian Blackwater, MD for evaluation and management of Anemia.  Patient has a history of extensive medical comorbidities including liver cirrhosis related to [facility] with portal hypertension, esophageal varices and portal hypertensive gastropathy and splenomegaly, iron deficiency anemia, obesity, diabetes, AAA.  Patient was admitted in April 2018 with acute blood loss anemia and required transfusion of multiple units of PRBCs with iron profile suggestive of iron deficiency. No overt evidence of hemolysis noted. He had an EGD and colonoscopy that did not show any overt bleeding. Capsule endoscopy was unrevealing but the capsule past and only 27 minutes.  He follows with Dr. [name] who is his gastric oncologist.  Patient was again readmitted in June 2018 with symptomatically anemia and hemoglobin of 6.3. He underwent 4 units of PRBC transfusions again and was sent out on PPI and iron supplementation with the plan to follow-up with Dr. [name].  He was also given hematology referral. He had a repeat ultrasound of the abdomen on 07/29/2016 which showed significant increase in  splenomegaly in 2 months suggesting significant portal hypertension versus some element of splenic sequestration.  He continues to note intermittent black stools.  Hemoglobin is stable and improved today and is up to 11.1. Patient notes he feels better after his transfusions. Has not noted lower GI bleeding at this time. We discuss any other workup to rule out less likely other possibilities of his anemia.  INTERVAL HISTORY  William [name] is here for a scheduled follow-up of his anemia that is primarily related to ongoing issues with GI bleeding and has not been controllable by multiple GI interventions. The patient's last visit with [place] was on 07/09/2018. The pt reports that he is doing well overall.  The pt reports some SOB. He is currently taking Lysteda 1 tablet BID. His last transfusion was in 03/2017. Pt had a recent endovascular graft repair for Triple A.  Of note since the patient's last visit, pt has had upper endoscopy completed on 08/21/2018 with results revealing "Small (< 5 mm) esophageal varices. Portal hypertensive gastropathy. Normal examined duodenum. No specimens collected."  He also had a CT A/P w/wo contrast completed on 07/17/2018 which revealed "1. There is again fusiform dilation of the infrarenal abdominal aorta measuring up to 3.2 cm. Additionally, there is an outpouching of contrast extending along the posterolateral aspect of the aorta, beyond the intimal calcifications, measuring approximately 3.1 x 1.0 cm (image 455, series 7) with additional small outpouching located immediately superiorly. 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 2. Findings of probable pulmonary fibrosis better evaluated on recent dedicated CT of the chest May 2020. 3. Cirrhosis with portal hypertension as previously  described. Trace Ascites. 4. Three-vessel coronary artery disease."  Lab results today (09/17/2018) of CBC  w/diff and CMP  is as follows: all values are WNL except for RBC at 3.87, HGB at 12.4, HCT at 36.9, RDW at 16.9, PLT at 78k, eosinophils abs at 0.6, CO2 at 19, glucose bld at 122, Calcium at 8.5, Albumin at 2.8, total bilirubin at 7.3 09/17/2018 Ferritin at 265  On review of systems, pt reports SOB and denies any other symptoms.   MEDICAL HISTORY:  Past Medical History:  Diagnosis Date  . AAA (abdominal aortic aneurysm) ([place]) 03/2014   3.2 cm on MR abd.  F/u aortic u/s 06/2014 showed 3.0 x 3.1 cm AAA: recheck 2 yrs recommended (followed by cardiologist).  Minimal growth on 10/2016 CT abd done for epig pain.  Got endovascular AAA surgery/repair 08/23/18 at [facility].  . Anemia due to chronic blood loss 2018/19   GI: transfusions x >20 required; multiple endoscopies and bleeding scans unrevealing.  Hemolysis ruled out by hematologist.  Marshell Levan and octreotide + monthly iron infusions as of 04/2017.  . Bilateral renal cysts    simple (03/2014 MRI)  . CAD (coronary artery disease)   . Cancer ([place]) 08/15/2018   BCC nose, excised  . Cholelithiases 2018   asymptomatic  . Chronic diastolic heart failure ([place])   . Cirrhosis ([place]) 05/2016   secondary to NASH; most recent u/s abd 07/2016 showed splenomegaly.  Hx of portal HTN changes with mild ascites and splenomegaly.  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  [name] Cirrhosis ([place])    [facility] liver transplant clinic eval 05/15/18--->they agree he needs liver transplant but want to get some pulm colleagues to weigh in on his lung fxn/pulm scarring---PULM REHAB recommended  . Diabetes mellitus with complication ([place]) 71/0626   A1c 6.8%  . History of blood transfusion 2018 X 4 dates   "low blood" (09/21/2016)  . Hyperlipemia, mixed    elevated LFTs when on statins.    . Hypertension    Cr bump 04/01/16 so I changed benicar-hct to benicar plain and added amlodipine 5 mg.  . Iron deficiency anemia 05/2016   Acute blood loss anemia:  hospitalized, required transfusion x 3 U: colonoscopy and capsule study unrevealing.  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  W/u for ? hemolytic anemia to be pursued by hematologist as outpt.  Dr. [name], GI at [facility] following, too---he rec'd onc do bone marrow bx as of Jan 2019  . Liver failure ([place]) 2020   NASH cirrhosis-->pt approved for liver transplant list at [facility] as of 05/16/2018  . Lung field abnormal finding on examination    Bibasilar L>R mild insp crackles-->x-ray showed mild interstitial changes/fibrosis/scarring.  Changes noted on all CXRs in 2018/2019.  Liver Transplant eval 03/2018->mild restriction on PFTs but no obstruction.  . Microscopic hematuria    Eval unremarkable by Dr. Eulogio Ditch.  [name] NASH (nonalcoholic steatohepatitis)    Fatty liver on MR abd 03/2014, + hx of elevated transaminases and bili: followed by Dr. [name].  . Obesity   . Pulmonary fibrosis ([place])    PFTs: restrictive lung dz ([facility] Liver transplant eval 05/2018)  . Spleen enlarged     SURGICAL HISTORY: Past Surgical History:  Procedure Laterality Date  . ABDOMINAL AORTIC ANEURYSM REPAIR  08/23/2018   [facility]  . CARDIAC CATHETERIZATION    . CARDIOVASCULAR STRESS TEST  01/17/2012; 05/04/16   Normal stress nuclear study x 2 (2018--Normal perfusion.  LVEF 66% with normal wall motion. This is a low risk study).  . CERVICAL DISCECTOMY  1992  . COLONOSCOPY N/A 05/27/2016   No site/explanation for blood loss found.  Erythematous mucosa in cecum and ascending colon--Cecal bx: normal.  Procedure: COLONOSCOPY;  Surgeon: Carol Ada, MD;  Location: [facility] ENDOSCOPY;  Service: Endoscopy;  Laterality: N/A;  . COLONOSCOPY W/ POLYPECTOMY  09/10/2014   Polypectomy x 2: recall 5 yrs (Dr. [name]).  . CORONARY ANGIOPLASTY    . CORONARY ARTERY BYPASS GRAFT  03/2006  . ENTEROSCOPY N/A 07/31/2016   Procedure: ENTEROSCOPY;  Surgeon: Ladene Artist, MD;   Location: [facility] ENDOSCOPY;  Service: Endoscopy;  Laterality: N/A;  . ENTEROSCOPY N/A 12/02/2016   Procedure: ENTEROSCOPY;  Surgeon: Carol Ada, MD;  Location: [facility] ENDOSCOPY;  Service: Endoscopy;  Laterality: N/A;  . ESOPHAGOGASTRODUODENOSCOPY N/A 05/25/2016   No site/explanation for blood loss found.  Procedure: ESOPHAGOGASTRODUODENOSCOPY (EGD);  Surgeon: Juanita Craver, MD;  Location: [facility] ENDOSCOPY;  Service: Endoscopy;  Laterality: N/A;  . ESOPHAGOGASTRODUODENOSCOPY N/A 09/23/2016   Procedure: ESOPHAGOGASTRODUODENOSCOPY (EGD);  Surgeon: Carol Ada, MD;  Location: [place];  Service: Endoscopy;  Laterality: N/A;  . ESOPHAGOGASTRODUODENOSCOPY (EGD) WITH PROPOFOL N/A 08/21/2018   Procedure: ESOPHAGOGASTRODUODENOSCOPY (EGD) WITH PROPOFOL;  Surgeon: Carol Ada, MD;  Location: [facility] ENDOSCOPY;  Service: Endoscopy;  Laterality: N/A;  . GIVENS CAPSULE STUDY N/A 05/27/2016   No source identified.  Repeat 10/2016--results pending.  Procedure: GIVENS CAPSULE STUDY;  Surgeon: Carol Ada, MD;  Location: [facility] ENDOSCOPY;  Service: Endoscopy;  Laterality: N/A;  . GIVENS CAPSULE STUDY N/A 10/15/2016   Procedure: GIVENS CAPSULE STUDY;  Surgeon: Carol Ada, MD;  Location: [facility] ENDOSCOPY;  Service: Endoscopy;  Laterality: N/A;  . GIVENS CAPSULE STUDY N/A 02/13/2017   Procedure: GIVENS CAPSULE STUDY;  Surgeon: Carol Ada, MD;  Location: [place];  Service: Endoscopy;  Laterality: N/A;  . NECK SURGERY  1991  . NM GI BLOOD LOSS  01/27/2017   NEGATIVE  . SMALL BOWEL ENTEROSCOPY  10/2016   ([facility], Dr. [name]: Double balloon enteroscopy--to the level of proximal ileum) jejunal polyps- which were removed--not bleeding & benign.  No source of bleeding was identified.  . TRANSTHORACIC ECHOCARDIOGRAM  05/25/2016; 10/2017   05/2016: EF 60%, normal wall motion, grd II DD, mild aortic stenosis, dilated aortic root and ascending aorta. 10/2017: Mild LVH; no mention made of LV function.  Moderate aorticstenosis with mean gradient 19  peak gradient 41. AVA 1.1 cm^2.  Mild ascending aorta dilation.  Mean gradient slightly lower than in 2018.  [name] VASECTOMY  1977    SOCIAL HISTORY: Social History   Socioeconomic History  . Marital status: Married    Spouse name: Not on file  . Number of children: Not on file  . Years of education: Not on file  . Highest education level: Not on file  Occupational History  . Not on file  Social Needs  . Financial resource strain: Not on file  . Food insecurity    Worry: Not on file    Inability: Not on file  . Transportation needs    Medical: Not on file    Non-medical: Not on file  Tobacco Use  . Smoking status: Former Smoker    Types: Cigarettes  . Smokeless tobacco: Never Used  . Tobacco comment: QUIT I 1983  Substance and Sexual Activity  . Alcohol use: No  . Drug use: No  . Sexual activity: Yes  Lifestyle  . Physical activity    Days per  week: 0 days    Minutes per session: 0 min  . Stress: Not on file  Relationships  . Social Herbalist on phone: Not on file    Gets together: Not on file    Attends religious service: Not on file    Active member of club or organization: Not on file    Attends meetings of clubs or organizations: Not on file    Relationship status: Not on file  . Intimate partner violence    Fear of current or ex partner: Not on file    Emotionally abused: Not on file    Physically abused: Not on file    Forced sexual activity: Not on file  Other Topics Concern  . Not on file  Social History Narrative   Married, 3 grown children, 3 GCs.   Educ: 10th grade.   Occupation: Retired Brewing technologist.   Tob: quit 1983, smoked about 20 pack-yr hx prior.   No alcohol.    FAMILY HISTORY: Family History  Problem Relation Age of Onset  . Hypertension Mother   . Cancer - Other Mother        liver cancer  . Heart disease Father   . Heart attack Father   . Cancer - Lung Father   . Diabetes Father   . Liver disease Sister   . Anemia  Sister   . Heart disease Sister   . Heart attack Sister   . Heart disease Brother        CABG 20 YEARS AGO  . Hypertension Brother   . Mesothelioma Brother        HALF-BROTHER  . Cancer - Other Brother        CLL  . Cancer - Lung Brother        Mets to brain  . Cancer - Lung Sister        HALF-SISTER  . Liver disease Brother   . Heart disease Brother        CABG-2012  . Emphysema Maternal Grandfather   . Cancer - Other Paternal Grandmother        Stomach  . Heart attack Paternal Grandfather     ALLERGIES:  is allergic to statins.  MEDICATIONS:  Current Outpatient Medications  Medication Sig Dispense Refill  . iron sucrose (VENOFER) 20 MG/ML injection Inject 750 mg into the vein every 30 (thirty) days.     [name] lactulose (CHRONULAC) 10 GM/15ML solution Take 13.3333 g by mouth 2 (two) times a day.    . metoprolol tartrate (LOPRESSOR) 25 MG tablet Take 0.5 tablets (12.5 mg total) by mouth 2 (two) times daily. 90 tablet 1  . OCTREOTIDE ACETATE IJ Inject 30 mcg into the vein every 30 (thirty) days.     . pantoprazole (PROTONIX) 40 MG tablet TAKE 1 TABLET BY MOUTH TWICE DAILY BEFORE A MEAL (Patient taking differently: Take 40 mg by mouth 2 (two) times daily before a meal. TAKE 1 TABLET BY MOUTH TWICE DAILY BEFORE A MEAL) 180 tablet 1  . potassium chloride SA (K-DUR) 20 MEQ tablet TAKE 1 TABLET (20 MEQ TOTAL) BY MOUTH TWICE A DAY (Patient taking differently: Take 20 mEq by mouth 2 (two) times daily. TAKE 1 TABLET (20 MEQ TOTAL) BY MOUTH TWICE A DAY) 180 tablet 1  . spironolactone (ALDACTONE) 25 MG tablet Take 0.5 tablets (12.5 mg total) by mouth daily. 45 tablet 1  . torsemide (DEMADEX) 20 MG tablet Take 2 tablets (40 mg total) by  mouth daily. 180 tablet 1  . tranexamic acid (LYSTEDA) 650 MG TABS tablet TAKE 2 TABLETS BY MOUTH 3 TIMES DAILY. (Patient taking differently: Take 650 mg by mouth 2 (two) times a day. [name]) 180 tablet 1   No current facility-administered medications for this visit.     Facility-Administered Medications Ordered in Other Visits  Medication Dose Route Frequency Provider Last Rate Last Dose  . heparin lock flush 100 unit/mL  500 Units Intracatheter Daily PRN Truitt Merle, MD      . sodium chloride flush (NS) 0.9 % injection 10 mL  10 mL Intracatheter PRN Truitt Merle, MD        REVIEW OF SYSTEMS:    A 10+ POINT REVIEW OF SYSTEMS WAS OBTAINED including neurology, dermatology, psychiatry, cardiac, respiratory, lymph, extremities, GI, GU, Musculoskeletal, constitutional, breasts, reproductive, HEENT.  All pertinent positives are noted in the HPI.  All others are negative.   PHYSICAL EXAMINATION:   ECOG PERFORMANCE STATUS: 1 - Symptomatic but completely ambulatory   GENERAL:alert, in no acute distress and comfortable SKIN: no acute rashes, no significant lesions EYES: conjunctiva are pink and non-injected, sclera anicteric OROPHARYNX: MMM, no exudates, no oropharyngeal erythema or ulceration NECK: supple, no JVD LYMPH:  no palpable lymphadenopathy in the cervical, axillary or inguinal regions LUNGS: clear to auscultation b/l with normal respiratory effort HEART: regular rate & rhythm ABDOMEN:  normoactive bowel sounds , non tender, not distended. No palpable hepatosplenomegaly.  Extremity: no pedal edema PSYCH: alert & oriented x 3 with fluent speech NEURO: no focal motor/sensory deficits   LABORATORY DATA:  I have reviewed the data as listed  . CBC Latest Ref Rng & Units 09/17/2018 08/13/2018 07/09/2018  WBC 4.0 - 10.5 K/uL 4.7 6.0 7.0  Hemoglobin 13.0 - 17.0 g/dL 12.4(L) 13.2 13.4  Hematocrit 39.0 - 52.0 % 36.9(L) 38.7(L) 40.6  Platelets 150 - 400 K/uL 78(L) 95(L) 93(L)    CMP Latest Ref Rng & Units 09/17/2018 04/12/2018 03/28/2018  Glucose 70 - 99 mg/dL 122(H) 152(H) 126(H)  BUN 8 - 23 mg/dL 11 15 11   Creatinine 0.61 - 1.24 mg/dL 1.19 1.30(H) 1.13  Sodium 135 - 145 mmol/L 136 134(L) 133(L)  Potassium 3.5 - 5.1 mmol/L 3.7 4.1 3.3(L)  Chloride 98 - 111  mmol/L 105 100 104  CO2 22 - 32 mmol/L 19(L) 26 21(L)  Calcium 8.9 - 10.3 mg/dL 8.5(L) 8.7(L) 7.5(L)  Total Protein 6.5 - 8.1 g/dL 6.7 7.6 5.5(L)  Total Bilirubin 0.3 - 1.2 mg/dL 7.3(HH) 9.8(H) 6.3(H)  Alkaline Phos 38 - 126 U/L 111 111 90  AST 15 - 41 U/L 41 58(H) 34  ALT 0 - 44 U/L 29 34 28    Lab Results  Component Value Date   FERRITIN 265 09/17/2018   . RADIOGRAPHIC STUDIES: I have personally reviewed the radiological images as listed and agreed with the findings in the report. No results found.  ASSESSMENT & PLAN:   68 y.o. William with multiple medical comorbidities with Karlene Lineman with liver cirrhosis with portal hypertension, esophageal varices and portal hypertensive gastropathy with ongoing chronic GI bleeding   #1 Chronic blood loss anemia.  Patient has had recurrent GI bleeding requiring > 20 units of PRBCs. since April 2018 and continues to have ongoing GI bleeding.  Previous workup showed LDH within normal limits at 220 and suggests against overt hemolysis. Sedimentation rate within normal limits. Coombs' test is negative. Myeloma panel and serum free light chains suggest no monoclonal paraproteinemia. PNH testing negative Slightly lower  haptoglobin levels can be related to decreased hepatic production, low-level hemolysis due to transfusions or extravascular hemolysis due to splenic or hepatic sequestration.  small bowel endoscopy done on 12/02/2016: impression - The examined portion of the jejunum was normal. - Normal examined duodenum. - Portal hypertensive gastropathy. -Small (< 5 mm) esophageal varices. - No specimens collected.  Capsule Endoscopy on 02/15/17: No evidence of any bleeding during this examination. There was the possibility of an atypical AVM manifested as an erythematous patch. No evidence of any ulcerations, erosions, masses, or polyps.  08/21/2018 upper endoscopy revealed "Small (< 5 mm) esophageal varices. Portal hypertensive gastropathy. Normal  examined duodenum. No specimens collected."  07/17/2018 CT A/P w/wo contrast revealed "1. There is again fusiform dilation of the infrarenal abdominal aorta measuring up to 3.2 cm. Additionally, there is an outpouching of contrast extending along the posterolateral aspect of the aorta, beyond the intimal calcifications, measuring approximately 3.1 x 1.0 cm (image 455, series 7) with additional small outpouching located immediately superiorly. 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 2. Findings of probable pulmonary fibrosis better evaluated on recent dedicated CT of the chest May 2020. 3. Cirrhosis with portal hypertension as previously  described. Trace Ascites. 4. 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 -No indication for PRBC transfusion today -Will continue monthly Sandostatin, monthly IV Injectafer unless Ferritin levels >500 -Continue Lysteda 1 tablet BID -Continue care with [facility] and consideration of liver transplant -Continue follow up with GI and liver management -Overall his PRBC transfusion requirements have decreased with a combination of sandostatin + IV iron + lysteda -Still having ongoing sub clinical GI losses as witnessed by decreasing ferritin levels needing ongoing IV iron replacement. -Will see the pt back in 16 weeks   -Continue IV Injectafer q48weeks x 3 doses -continue q4weekly Sandostatin- plz schedule next 6 -labs q8weeks -RTC with Dr Irene Limbo in 16 weeks    All of the patients questions were answered with apparent satisfaction. The patient knows to call the clinic with any problems, questions or concerns.    The total time spent in the appt was 25 minutes and more than 50% was on counseling and direct patient cares.   Sullivan Lone MD [place] AAHIVMS [facility] Hematology/Oncology Physician [facility]  (Office):       [phone] (Work cell):  [phone] (Fax):           [phone]  I, De Burrs, am acting as a scribe for Dr. Irene Limbo  .I have reviewed the above documentation for accuracy and completeness, and I agree with the above. Brunetta Genera MD

## 2018-09-17 ENCOUNTER — Inpatient Hospital Stay: Payer: 59 | Attending: Hematology

## 2018-09-17 ENCOUNTER — Inpatient Hospital Stay (HOSPITAL_BASED_OUTPATIENT_CLINIC_OR_DEPARTMENT_OTHER): Payer: 59 | Admitting: Hematology

## 2018-09-17 ENCOUNTER — Telehealth: Payer: Self-pay | Admitting: Emergency Medicine

## 2018-09-17 ENCOUNTER — Other Ambulatory Visit: Payer: Self-pay | Admitting: *Deleted

## 2018-09-17 ENCOUNTER — Inpatient Hospital Stay: Payer: 59

## 2018-09-17 ENCOUNTER — Other Ambulatory Visit: Payer: Self-pay

## 2018-09-17 VITALS — BP 121/59 | HR 70 | Temp 98.7°F | Resp 18 | Ht 68.5 in | Wt 210.6 lb

## 2018-09-17 VITALS — BP 109/56 | HR 58 | Resp 17

## 2018-09-17 DIAGNOSIS — K922 Gastrointestinal hemorrhage, unspecified: Secondary | ICD-10-CM | POA: Diagnosis not present

## 2018-09-17 DIAGNOSIS — D500 Iron deficiency anemia secondary to blood loss (chronic): Secondary | ICD-10-CM

## 2018-09-17 LAB — CMP (CANCER CENTER ONLY)
ALT: 29 U/L (ref 0–44)
AST: 41 U/L (ref 15–41)
Albumin: 2.8 g/dL — ABNORMAL LOW (ref 3.5–5.0)
Alkaline Phosphatase: 111 U/L (ref 38–126)
Anion gap: 12 (ref 5–15)
BUN: 11 mg/dL (ref 8–23)
CO2: 19 mmol/L — ABNORMAL LOW (ref 22–32)
Calcium: 8.5 mg/dL — ABNORMAL LOW (ref 8.9–10.3)
Chloride: 105 mmol/L (ref 98–111)
Creatinine: 1.19 mg/dL (ref 0.61–1.24)
GFR, Est AFR Am: >60 mL/min (ref >60)
GFR, Estimated: >60 mL/min (ref >60)
Glucose, Bld: 122 mg/dL — ABNORMAL HIGH (ref 70–99)
Potassium: 3.7 mmol/L (ref 3.5–5.1)
Sodium: 136 mmol/L (ref 135–145)
Total Bilirubin: 7.3 mg/dL (ref 0.3–1.2)
Total Protein: 6.7 g/dL (ref 6.5–8.1)

## 2018-09-17 LAB — CBC WITH DIFFERENTIAL (CANCER CENTER ONLY)
Abs Immature Granulocytes: 0.02 10*3/uL (ref 0.00–0.07)
Basophils Absolute: 0.1 10*3/uL (ref 0.0–0.1)
Basophils Relative: 2 %
Eosinophils Absolute: 0.6 10*3/uL — ABNORMAL HIGH (ref 0.0–0.5)
Eosinophils Relative: 13 %
HCT: 36.9 % — ABNORMAL LOW (ref 39.0–52.0)
Hemoglobin: 12.4 g/dL — ABNORMAL LOW (ref 13.0–17.0)
Immature Granulocytes: 0 %
Lymphocytes Relative: 15 %
Lymphs Abs: 0.7 10*3/uL (ref 0.7–4.0)
MCH: 32 pg (ref 26.0–34.0)
MCHC: 33.6 g/dL (ref 30.0–36.0)
MCV: 95.3 fL (ref 80.0–100.0)
Monocytes Absolute: 0.8 10*3/uL (ref 0.1–1.0)
Monocytes Relative: 16 %
Neutro Abs: 2.5 10*3/uL (ref 1.7–7.7)
Neutrophils Relative %: 54 %
Platelet Count: 78 10*3/uL — ABNORMAL LOW (ref 150–400)
RBC: 3.87 MIL/uL — ABNORMAL LOW (ref 4.22–5.81)
RDW: 16.9 % — ABNORMAL HIGH (ref 11.5–15.5)
WBC Count: 4.7 10*3/uL (ref 4.0–10.5)
nRBC: 0 % (ref 0.0–0.2)

## 2018-09-17 LAB — FERRITIN: Ferritin: 265 ng/mL (ref 24–336)

## 2018-09-17 MED ORDER — OCTREOTIDE ACETATE 30 MG IM KIT
30.0000 mg | PACK | Freq: Once | INTRAMUSCULAR | Status: AC
  Administered 2018-09-17: 30 mg via INTRAMUSCULAR

## 2018-09-17 MED ORDER — OCTREOTIDE ACETATE 30 MG IM KIT
PACK | INTRAMUSCULAR | Status: AC
  Filled 2018-09-17: qty 1

## 2018-09-17 MED ORDER — SODIUM CHLORIDE 0.9 % IV SOLN
INTRAVENOUS | Status: DC
  Administered 2018-09-17: 11:00:00 via INTRAVENOUS
  Filled 2018-09-17: qty 250

## 2018-09-17 MED ORDER — SODIUM CHLORIDE 0.9 % IV SOLN
750.0000 mg | Freq: Once | INTRAVENOUS | Status: AC
  Administered 2018-09-17: 750 mg via INTRAVENOUS
  Filled 2018-09-17: qty 15

## 2018-09-17 NOTE — Progress Notes (Signed)
Pt declined to stay for post feraheme observation period. Vitals stable and pt ambulated out of clinic without incident

## 2018-09-17 NOTE — Telephone Encounter (Signed)
Received critical value from lab at 1008.  Bilirubin 7.3 Communicated to desk RN Carlyon Prows for MD Irene Limbo at 1012.

## 2018-09-17 NOTE — Patient Instructions (Signed)
Ferric carboxymaltose injection What is this medicine? FERRIC CARBOXYMALTOSE (ferr-ik car-box-ee-mol-toes) is an iron complex. Iron is used to make healthy red blood cells, which carry oxygen and nutrients throughout the body. This medicine is used to treat anemia in people with chronic kidney disease or people who cannot take iron by mouth. This medicine may be used for other purposes; ask your health care provider or pharmacist if you have questions. COMMON BRAND NAME(S): Injectafer What should I tell my health care provider before I take this medicine? They need to know if you have any of these conditions:  high levels of iron in the blood  liver disease  an unusual or allergic reaction to iron, other medicines, foods, dyes, or preservatives  pregnant or trying to get pregnant  breast-feeding How should I use this medicine? This medicine is for infusion into a vein. It is given by a health care professional in a hospital or clinic setting. Talk to your pediatrician regarding the use of this medicine in children. Special care may be needed. Overdosage: If you think you have taken too much of this medicine contact a poison control center or emergency room at once. NOTE: This medicine is only for you. Do not share this medicine with others. What if I miss a dose? It is important not to miss your dose. Call your doctor or health care professional if you are unable to keep an appointment. What may interact with this medicine? Do not take this medicine with any of the following medications:  deferoxamine  dimercaprol  other iron products This list may not describe all possible interactions. Give your health care provider a list of all the medicines, herbs, non-prescription drugs, or dietary supplements you use. Also tell them if you smoke, drink alcohol, or use illegal drugs. Some items may interact with your medicine. What should I watch for while using this medicine? Visit your  doctor or health care professional regularly. Tell your doctor if your symptoms do not start to get better or if they get worse. You may need blood work done while you are taking this medicine. You may need to follow a special diet. Talk to your doctor. Foods that contain iron include: whole grains/cereals, dried fruits, beans, or peas, leafy green vegetables, and organ meats (liver, kidney). What side effects may I notice from receiving this medicine? Side effects that you should report to your doctor or health care professional as soon as possible:  allergic reactions like skin rash, itching or hives, swelling of the face, lips, or tongue  dizziness  facial flushing Side effects that usually do not require medical attention (report to your doctor or health care professional if they continue or are bothersome):  changes in taste  constipation  headache  nausea, vomiting  pain, redness, or irritation at site where injected This list may not describe all possible side effects. Call your doctor for medical advice about side effects. You may report side effects to FDA at 1-800-FDA-1088. Where should I keep my medicine? This drug is given in a hospital or clinic and will not be stored at home. NOTE: This sheet is a summary. It may not cover all possible information. If you have questions about this medicine, talk to your doctor, pharmacist, or health care provider.  2020 Elsevier/Gold Standard (2016-03-10 09:40:29)  Octreotide injection solution What is this medicine? OCTREOTIDE (ok TREE oh tide) is used to reduce blood levels of growth hormone in patients with a condition called acromegaly. This medicine  also reduces flushing and watery diarrhea caused by certain types of cancer. This medicine may be used for other purposes; ask your health care provider or pharmacist if you have questions. COMMON BRAND NAME(S): Bynfezia, Sandostatin, Sandostatin LAR What should I tell my health care  provider before I take this medicine? They need to know if you have any of these conditions:  diabetes  gallbladder disease  kidney disease  liver disease  an unusual or allergic reaction to octreotide, other medicines, foods, dyes, or preservatives  pregnant or trying to get pregnant  breast-feeding How should I use this medicine? This medicine is for injection under the skin or into a vein (only in emergency situations). It is usually given by a health care professional in a hospital or clinic setting. If you get this medicine at home, you will be taught how to prepare and give this medicine. Allow the injection solution to come to room temperature before use. Do not warm it artificially. Use exactly as directed. Take your medicine at regular intervals. Do not take your medicine more often than directed. It is important that you put your used needles and syringes in a special sharps container. Do not put them in a trash can. If you do not have a sharps container, call your pharmacist or healthcare provider to get one. Talk to your pediatrician regarding the use of this medicine in children. Special care may be needed. Overdosage: If you think you have taken too much of this medicine contact a poison control center or emergency room at once. NOTE: This medicine is only for you. Do not share this medicine with others. What if I miss a dose? If you miss a dose, take it as soon as you can. If it is almost time for your next dose, take only that dose. Do not take double or extra doses. What may interact with this medicine? Do not take this medicine with any of the following medications:  cisapride  droperidol  general anesthetics  grepafloxacin  perphenazine  thioridazine This medicine may also interact with the following medications:  bromocriptine  cyclosporine  diuretics  medicines for blood pressure, heart disease, irregular heart beat  medicines for diabetes,  including insulin  quinidine This list may not describe all possible interactions. Give your health care provider a list of all the medicines, herbs, non-prescription drugs, or dietary supplements you use. Also tell them if you smoke, drink alcohol, or use illegal drugs. Some items may interact with your medicine. What should I watch for while using this medicine? Visit your doctor or health care professional for regular checks on your progress. To help reduce irritation at the injection site, use a different site for each injection and make sure the solution is at room temperature before use. This medicine may cause decreases in blood sugar. Signs of low blood sugar include chills, cool, pale skin or cold sweats, drowsiness, extreme hunger, fast heartbeat, headache, nausea, nervousness or anxiety, shakiness, trembling, unsteadiness, tiredness, or weakness. Contact your doctor or health care professional right away if you experience any of these symptoms. This medicine may increase blood sugar. Ask your healthcare provider if changes in diet or medicines are needed if you have diabetes. This medicine may cause a decrease in vitamin B12. You should make sure that you get enough vitamin B12 while you are taking this medicine. Discuss the foods you eat and the vitamins you take with your health care professional. What side effects may I notice from receiving  this medicine? Side effects that you should report to your doctor or health care professional as soon as possible:  allergic reactions like skin rash, itching or hives, swelling of the face, lips, or tongue  decreases in blood sugar  changes in heart rate  severe stomach pain   signs and symptoms of high blood sugar such as being more thirsty or hungry or having to urinate more than normal. You may also feel very tired or have blurry vision. Side effects that usually do not require medical attention (report to your doctor or health care  professional if they continue or are bothersome):  diarrhea or constipation  gas or stomach pain  nausea, vomiting  pain, redness, swelling and irritation at site where injected This list may not describe all possible side effects. Call your doctor for medical advice about side effects. You may report side effects to FDA at 1-800-FDA-1088. Where should I keep my medicine? Keep out of the reach of children. Store in a refrigerator between 2 and 8 degrees C (36 and 46 degrees F). Protect from light. Allow to come to room temperature naturally. Do not use artificial heat. If protected from light, the injection may be stored at room temperature between 20 and 30 degrees C (70 and 86 degrees F) for 14 days. After the initial use, throw away any unused portion of a multiple dose vial after 14 days. Throw away unused portions of the ampules after use. NOTE: This sheet is a summary. It may not cover all possible information. If you have questions about this medicine, talk to your doctor, pharmacist, or health care provider.  2020 Elsevier/Gold Standard (2017-11-02 08:07:09)

## 2018-09-18 ENCOUNTER — Encounter: Payer: Self-pay | Admitting: Family Medicine

## 2018-09-18 ENCOUNTER — Telehealth: Payer: Self-pay | Admitting: Hematology

## 2018-09-18 NOTE — Telephone Encounter (Signed)
I talk with patient regarding schedule  

## 2018-09-19 ENCOUNTER — Telehealth: Payer: Self-pay

## 2018-09-19 NOTE — Telephone Encounter (Signed)
Received referral form for outpatient cardiac and pulmonary rehab. PCP will review and sign, if appropriate.

## 2018-09-20 ENCOUNTER — Telehealth (HOSPITAL_COMMUNITY): Payer: Self-pay

## 2018-09-20 NOTE — Telephone Encounter (Signed)
Pt insurance is active and benefits verified through [place] [facility], DED $2,800/$2,505.16 met, out of pocket $4,000/$4,000 met, co-insurance 20%. no pre-authorization required, REF# [id number]

## 2018-09-21 DIAGNOSIS — I714 Abdominal aortic aneurysm, without rupture: Secondary | ICD-10-CM | POA: Diagnosis not present

## 2018-09-21 DIAGNOSIS — K766 Portal hypertension: Secondary | ICD-10-CM | POA: Diagnosis not present

## 2018-09-21 DIAGNOSIS — K7460 Unspecified cirrhosis of liver: Secondary | ICD-10-CM | POA: Diagnosis not present

## 2018-09-21 DIAGNOSIS — J849 Interstitial pulmonary disease, unspecified: Secondary | ICD-10-CM | POA: Diagnosis not present

## 2018-09-23 NOTE — Telephone Encounter (Signed)
Signed and put in box to go up front.

## 2018-09-24 DIAGNOSIS — L570 Actinic keratosis: Secondary | ICD-10-CM | POA: Diagnosis not present

## 2018-09-24 MED FILL — VALACYCLOVIR HCL 500 MG TAB: 500 | 7 days supply | Qty: 14 | Fill #0

## 2018-09-24 MED FILL — FLUOROURACIL 5 % CREA: 5 | 30 days supply | Qty: 40 | Fill #0

## 2018-09-25 ENCOUNTER — Telehealth (HOSPITAL_COMMUNITY): Payer: Self-pay

## 2018-09-25 ENCOUNTER — Encounter: Payer: Self-pay | Admitting: Family Medicine

## 2018-09-25 NOTE — Telephone Encounter (Signed)
Pt insurance is active and benefits verified through [place] [facility], DED $2,800/$2,800 met, out of pocket $4,000/$4,000 met, co-insurance 20%. no pre-authorization required, REF# [id number] Pt is covered at 100% since both ded and oop is met.

## 2018-09-26 ENCOUNTER — Telehealth (HOSPITAL_COMMUNITY): Payer: Self-pay

## 2018-09-27 DIAGNOSIS — K7581 Nonalcoholic steatohepatitis (NASH): Secondary | ICD-10-CM | POA: Diagnosis not present

## 2018-09-27 DIAGNOSIS — K7460 Unspecified cirrhosis of liver: Secondary | ICD-10-CM | POA: Diagnosis not present

## 2018-09-27 DIAGNOSIS — I714 Abdominal aortic aneurysm, without rupture: Secondary | ICD-10-CM | POA: Diagnosis not present

## 2018-09-27 DIAGNOSIS — Z95828 Presence of other vascular implants and grafts: Secondary | ICD-10-CM | POA: Diagnosis not present

## 2018-09-28 DIAGNOSIS — Z7682 Awaiting organ transplant status: Secondary | ICD-10-CM | POA: Diagnosis not present

## 2018-09-28 DIAGNOSIS — K7460 Unspecified cirrhosis of liver: Secondary | ICD-10-CM | POA: Diagnosis not present

## 2018-09-28 DIAGNOSIS — K7581 Nonalcoholic steatohepatitis (NASH): Secondary | ICD-10-CM | POA: Diagnosis not present

## 2018-09-28 DIAGNOSIS — Z01810 Encounter for preprocedural cardiovascular examination: Secondary | ICD-10-CM | POA: Diagnosis not present

## 2018-09-28 DIAGNOSIS — I25810 Atherosclerosis of coronary artery bypass graft(s) without angina pectoris: Secondary | ICD-10-CM | POA: Diagnosis not present

## 2018-09-28 DIAGNOSIS — I350 Nonrheumatic aortic (valve) stenosis: Secondary | ICD-10-CM | POA: Diagnosis not present

## 2018-09-28 DIAGNOSIS — I714 Abdominal aortic aneurysm, without rupture: Secondary | ICD-10-CM | POA: Diagnosis not present

## 2018-09-29 DIAGNOSIS — J181 Lobar pneumonia, unspecified organism: Secondary | ICD-10-CM | POA: Diagnosis not present

## 2018-10-01 ENCOUNTER — Ambulatory Visit: Payer: 59 | Admitting: Hematology

## 2018-10-01 ENCOUNTER — Ambulatory Visit: Payer: 59

## 2018-10-01 ENCOUNTER — Other Ambulatory Visit: Payer: 59

## 2018-10-03 ENCOUNTER — Encounter: Payer: Self-pay | Admitting: Family Medicine

## 2018-10-04 DIAGNOSIS — K7581 Nonalcoholic steatohepatitis (NASH): Secondary | ICD-10-CM | POA: Diagnosis not present

## 2018-10-04 DIAGNOSIS — R0902 Hypoxemia: Secondary | ICD-10-CM | POA: Diagnosis not present

## 2018-10-04 DIAGNOSIS — K7460 Unspecified cirrhosis of liver: Secondary | ICD-10-CM | POA: Diagnosis not present

## 2018-10-04 DIAGNOSIS — Z01818 Encounter for other preprocedural examination: Secondary | ICD-10-CM | POA: Diagnosis not present

## 2018-10-04 DIAGNOSIS — J849 Interstitial pulmonary disease, unspecified: Secondary | ICD-10-CM | POA: Diagnosis not present

## 2018-10-04 LAB — BASIC METABOLIC PANEL
BUN: 11 (ref 4–21)
Creatinine: 1.3 (ref 0.6–1.3)
Glucose: 127
Potassium: 4.5 (ref 3.4–5.3)
Sodium: 138 (ref 137–147)

## 2018-10-04 LAB — CBC AND DIFFERENTIAL
HCT: 38 — AB (ref 41–53)
Hemoglobin: 12.9 — AB (ref 13.5–17.5)
Platelets: 68 — AB (ref 150–399)
WBC: 5

## 2018-10-04 LAB — POCT INR: INR: 1.7 — AB (ref 0.9–1.1)

## 2018-10-04 LAB — HEPATIC FUNCTION PANEL
ALT: 27 (ref 10–40)
AST: 39 (ref 14–40)
Alkaline Phosphatase: 121 (ref 25–125)
Bilirubin, Total: 6.7

## 2018-10-04 LAB — PROTIME-INR: Protime: 19.7 — AB (ref 10.0–13.8)

## 2018-10-07 ENCOUNTER — Encounter: Payer: Self-pay | Admitting: Family Medicine

## 2018-10-08 ENCOUNTER — Other Ambulatory Visit: Payer: Self-pay

## 2018-10-08 ENCOUNTER — Ambulatory Visit (INDEPENDENT_AMBULATORY_CARE_PROVIDER_SITE_OTHER): Payer: 59

## 2018-10-08 DIAGNOSIS — Z23 Encounter for immunization: Secondary | ICD-10-CM | POA: Diagnosis not present

## 2018-10-16 ENCOUNTER — Other Ambulatory Visit: Payer: Self-pay

## 2018-10-16 ENCOUNTER — Inpatient Hospital Stay: Payer: 59 | Attending: Hematology

## 2018-10-16 VITALS — BP 116/60 | HR 63 | Temp 97.7°F | Resp 18

## 2018-10-16 DIAGNOSIS — D500 Iron deficiency anemia secondary to blood loss (chronic): Secondary | ICD-10-CM | POA: Diagnosis not present

## 2018-10-16 DIAGNOSIS — K922 Gastrointestinal hemorrhage, unspecified: Secondary | ICD-10-CM | POA: Diagnosis not present

## 2018-10-16 MED ORDER — OCTREOTIDE ACETATE 30 MG IM KIT
PACK | INTRAMUSCULAR | Status: AC
  Filled 2018-10-16: qty 1

## 2018-10-16 MED ORDER — OCTREOTIDE ACETATE 30 MG IM KIT
30.0000 mg | PACK | Freq: Once | INTRAMUSCULAR | Status: AC
  Administered 2018-10-16: 10:00:00 30 mg via INTRAMUSCULAR

## 2018-10-17 ENCOUNTER — Encounter: Payer: Self-pay | Admitting: Family Medicine

## 2018-10-18 ENCOUNTER — Other Ambulatory Visit: Payer: Self-pay

## 2018-10-18 ENCOUNTER — Encounter (HOSPITAL_COMMUNITY)
Admission: RE | Admit: 2018-10-18 | Discharge: 2018-10-18 | Disposition: A | Discharge status: Home / Self Care | Payer: 59 | Source: Ambulatory Visit | Attending: Family Medicine | Admitting: Family Medicine

## 2018-10-18 VITALS — BP 114/62 | HR 64 | Ht 69.0 in | Wt 207.5 lb

## 2018-10-18 DIAGNOSIS — J8410 Pulmonary fibrosis, unspecified: Secondary | ICD-10-CM | POA: Diagnosis not present

## 2018-10-18 NOTE — Progress Notes (Signed)
William [name] 68 y.o. male Pulmonary Rehab Orientation Note Patient arrived today in Cardiac and Pulmonary Rehab for orientation to Pulmonary Rehab. He walked from the [place] without difficulty.  He does not carry portable oxygen. Per pt, he uses oxygen intermittently. He does not sleep with it, but requires it when he performs strenuous tasks at 1-2 L/minute. Color good, skin warm and dry. Patient is oriented to time and place. Patient's medical history, psychosocial health, and medications reviewed. Psychosocial assessment reveals pt lives with their spouse. Pt is currently retired. 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 Pt reports his stress level is low.  Pt does not exhibit  signs of depression.  PHQ2/9 score 0/0. Pt shows good  coping skills with positive outlook .  He is currently being evaluated at [facility] for a liver transplant.  Will continue to monitor and evaluate psychosocial well-being while in the program.   Physical assessment reveals heart rate is normal, breath sounds clear to auscultation, no wheezes, rales, or rhonchi. Grip strength equal, strong. Patient reports he does take medications as prescribed. Patient states he follows a Regular, liver failure diet diet. The patient reports no specific efforts to gain or lose weight.. Patient's weight will be monitored closely. Demonstration and practice of PLB using pulse oximeter. Patient able to return demonstration satisfactorily. Safety and hand hygiene in the exercise area reviewed with patient. Patient voices understanding of the information reviewed. Department expectations discussed with patient and achievable goals were set. The patient shows enthusiasm about attending the program and we look forward to working with this nice lady. The patient completed a 6 min walk test today and to begin exercise on Tuesday, October 23, 2018 in the 1030 class.   [place]

## 2018-10-18 NOTE — Progress Notes (Signed)
Pulmonary Individual Treatment Plan  Patient Details  Name: William [name] MRN: [id number] Date of Birth: [birth year] Referring Provider:     Pulmonary Rehab Walk Test from 10/18/2018 in [place]  Referring Provider  Dr. Anitra Lauth      Initial Encounter Date:    Pulmonary Rehab Walk Test from 10/18/2018 in [place]  Date  10/18/18      Visit Diagnosis: Pulmonary fibrosis ([place])  Patient's Home Medications on Admission:   Current Outpatient Medications:  .  iron sucrose (VENOFER) 20 MG/ML injection, Inject 750 mg into the vein every 30 (thirty) days. , Disp: , Rfl:  .  lactulose (CHRONULAC) 10 GM/15ML solution, Take 13.3333 g by mouth 2 (two) times a day., Disp: , Rfl:  .  metoprolol tartrate (LOPRESSOR) 25 MG tablet, Take 0.5 tablets (12.5 mg total) by mouth 2 (two) times daily., Disp: 90 tablet, Rfl: 1 .  OCTREOTIDE ACETATE IJ, Inject 30 mcg into the vein every 30 (thirty) days. , Disp: , Rfl:  .  pantoprazole (PROTONIX) 40 MG tablet, TAKE 1 TABLET BY MOUTH TWICE DAILY BEFORE A MEAL (Patient taking differently: Take 40 mg by mouth 2 (two) times daily before a meal. TAKE 1 TABLET BY MOUTH TWICE DAILY BEFORE A MEAL), Disp: 180 tablet, Rfl: 1 .  potassium chloride SA (K-DUR) 20 MEQ tablet, TAKE 1 TABLET (20 MEQ TOTAL) BY MOUTH TWICE A DAY (Patient taking differently: Take 20 mEq by mouth 2 (two) times daily. TAKE 1 TABLET (20 MEQ TOTAL) BY MOUTH TWICE A DAY), Disp: 180 tablet, Rfl: 1 .  spironolactone (ALDACTONE) 25 MG tablet, Take 0.5 tablets (12.5 mg total) by mouth daily., Disp: 45 tablet, Rfl: 1 .  torsemide (DEMADEX) 20 MG tablet, Take 2 tablets (40 mg total) by mouth daily., Disp: 180 tablet, Rfl: 1 .  tranexamic acid (LYSTEDA) 650 MG TABS tablet, TAKE 2 TABLETS BY MOUTH 3 TIMES DAILY. (Patient taking differently: Take 650 mg by mouth 2 (two) times a day. [name]), Disp: 180 tablet, Rfl: 1 No current facility-administered  medications for this encounter.   Facility-Administered Medications Ordered in Other Encounters:  .  heparin lock flush 100 unit/mL, 500 Units, Intracatheter, Daily PRN, Truitt Merle, MD .  sodium chloride flush (NS) 0.9 % injection 10 mL, 10 mL, Intracatheter, PRN, Truitt Merle, MD  Past Medical History: Past Medical History:  Diagnosis Date  . AAA (abdominal aortic aneurysm) ([place]) 03/2014   repaired 2020  . Anemia due to chronic blood loss 2018/19   GI: transfusions x >20 required; multiple endoscopies and bleeding scans unrevealing. Lysteda and octreotide + monthly iron infusions as of 04/2017. Iron infusions changed to every other month as of 09/2018 hem f/u.  [name] Bilateral renal cysts    simple (03/2014 MRI)  . CAD (coronary artery disease)   . Cancer ([place]) 08/15/2018   BCC nose, excised  . Cholelithiases 2018   asymptomatic  . Chronic diastolic heart failure ([place])   . 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  Hx of portal HTN changes with mild ascites and splenomegaly.  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  [name] Cirrhosis ([place])    [facility] liver transplant clinic eval 05/15/18--->they agree he needs liver transplant but want to get some pulm colleagues to weigh in on his lung fxn/pulm scarring---PULM REHAB recommended  . 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 History of blood transfusion 2018 X 4 dates   "low blood" (09/21/2016)  . Hyperlipemia, mixed    elevated LFTs when on statins.    . Hypertension    Cr bump 04/01/16 so I changed benicar-hct to benicar plain and added amlodipine 5 mg.  . Iron deficiency anemia 05/2016   Acute blood loss anemia: hospitalized, required transfusion x 3 U: colonoscopy and capsule study unrevealing.  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  W/u for  ? hemolytic anemia to be pursued by hematologist as outpt.  Dr. [name], GI at [facility] following, too---he rec'd onc do bone marrow bx as of Jan 2019  . Liver failure ([place]) 2020   NASH cirrhosis-->pt approved for liver transplant list at [facility] as of 05/16/2018  . Lung field abnormal finding on examination    Bibasilar L>R mild insp crackles-->x-ray showed mild interstitial changes/fibrosis/scarring.  Changes noted on all CXRs in 2018/2019.  Liver Transplant eval 03/2018->mild restriction on PFTs but no obstruction.  . Microscopic hematuria    Eval unremarkable by Dr. Eulogio Ditch.  [name] NASH (nonalcoholic steatohepatitis)    Fatty liver on MR abd 03/2014, + hx of elevated transaminases and bili: followed by Dr. [name].  . Obesity   . Pulmonary fibrosis ([place])    PFTs: restrictive lung dz ([facility] transplant eval 05/2018)  . Spleen enlarged     Tobacco Use: Social History   Tobacco Use  Smoking Status Former Smoker  . Types: Cigarettes  Smokeless Tobacco Never Used  Tobacco Comment   QUIT I 1983    Labs: Recent Review Flowsheet Data    Labs for ITP Cardiac and Pulmonary Rehab Latest Ref Rng & Units 01/04/2017 01/25/2017 04/05/2017 01/26/2018 05/15/2018   Cholestrol 100 - 199 mg/dL - 162 - - -   LDLCALC 0 - 99 mg/dL - 100(H) - - -   HDL >39 mg/dL - 47 - - -   Trlycerides 0 - 149 mg/dL - 76 - - -   Hemoglobin A1c - 5.5 - 4.6 4.7 4.7      Capillary Blood Glucose: Lab Results  Component Value Date   GLUCAP 154 (H) 05/11/2017   GLUCAP 109 (H) 12/01/2016   GLUCAP 127 (H) 12/01/2016   GLUCAP 122 (H) 09/23/2016   GLUCAP 162 (H) 09/22/2016     Pulmonary Assessment Scores: Pulmonary Assessment Scores    Row Name 10/18/18 1210         ADL UCSD   ADL Phase  Entry       mMRC Score   mMRC Score  1       UCSD: Self-administered rating of dyspnea associated with activities of daily living (ADLs) 6-point scale (0 = "not at all" to 5 = "maximal or unable to do because of breathlessness")   Scoring Scores range from 0 to 120.  Minimally important difference is 5 units  CAT: CAT can identify the health impairment of COPD patients and is better correlated with disease progression.  CAT has a scoring range of zero to 40. The CAT score is classified into four groups of low (less than 10), medium (10 - 20), high (21-30) and very high (31-40) based on the impact level of disease on health status. A CAT score over 10 suggests significant symptoms.  A worsening CAT score could be explained by an exacerbation, poor medication adherence, poor  inhaler technique, or progression of COPD or comorbid conditions.  CAT MCID is 2 points  mMRC: mMRC (Modified Medical Research Council) Dyspnea Scale is used to assess the degree of baseline functional disability in patients of respiratory disease due to dyspnea. No minimal important difference is established. A decrease in score of 1 point or greater is considered a positive change.   Pulmonary Function Assessment: Pulmonary Function Assessment - 10/18/18 0930      Breath   Bilateral Breath Sounds  Clear    Shortness of Breath  Yes;Limiting activity       Exercise Target Goals: Exercise Program Goal: Individual exercise prescription set using results from initial 6 min walk test and THRR while considering  patient's activity barriers and safety.   Exercise Prescription Goal: Initial exercise prescription builds to 30-45 minutes a day of aerobic activity, 2-3 days per week.  Home exercise guidelines will be given to patient during program as part of exercise prescription that the participant will acknowledge.  Activity Barriers & Risk Stratification: Activity Barriers & Cardiac Risk Stratification - 10/18/18 0926      Activity Barriers & Cardiac Risk Stratification   Activity Barriers  Deconditioning;Muscular Weakness;Shortness of Breath       6 Minute Walk: 6 Minute Walk    Row Name 10/18/18 1211         6 Minute Walk   Phase   Initial     Distance  456 feet     Walk Time  2.5 minutes     # of Rest Breaks  1 3.5 minutes due to desaturation. It took a long time for his saturations to rise back up. Still could not maintain saturations on 3L.     MPH  0.86     METS  1.77     RPE  12     Perceived Dyspnea   3     VO2 Peak  4.1     Symptoms  No     Resting HR  64 bpm     Resting BP  106/68     Resting Oxygen Saturation   90 %     Exercise Oxygen Saturation  during 6 min walk  81 %     Max Ex. HR  85 bpm     Max Ex. BP  128/72     2 Minute Post BP  112/66       Interval HR   1 Minute HR  85     2 Minute HR  85     3 Minute HR  74     4 Minute HR  71     5 Minute HR  70     6 Minute HR  76     2 Minute Post HR  64     Interval Heart Rate?  Yes       Interval Oxygen   Interval Oxygen?  Yes     Baseline Oxygen Saturation %  90 %     1 Minute Oxygen Saturation %  85 %     1 Minute Liters of Oxygen  2 L     2 Minute Oxygen Saturation %  81 %     2 Minute Liters of Oxygen  2 L     3 Minute Oxygen Saturation %  83 %     3 Minute Liters of Oxygen  3 L     4 Minute Oxygen Saturation %  86 %  4 Minute Liters of Oxygen  3 L     5 Minute Oxygen Saturation %  88 %     5 Minute Liters of Oxygen  3 L     6 Minute Oxygen Saturation %  86 %     6 Minute Liters of Oxygen  3 L     2 Minute Post Oxygen Saturation %  91 %     2 Minute Post Liters of Oxygen  3 L        Oxygen Initial Assessment: Oxygen Initial Assessment - 10/18/18 1209      Home Oxygen   Home Oxygen Device  Home Concentrator;E-Tanks    Sleep Oxygen Prescription  None    Home Exercise Oxygen Prescription  Continuous    Liters per minute  -1    Home at Rest Exercise Oxygen Prescription  None    Compliance with Home Oxygen Use  Yes      Initial 6 min Walk   Oxygen Used  Continuous    Liters per minute  3      Program Oxygen Prescription   Program Oxygen Prescription  Continuous    Liters per minute  4    Comments  Unable to maintain  sats on 3L during walk test, will use 4L for exercise.      Intervention   Short Term Goals  To learn and exhibit compliance with exercise, home and travel O2 prescription;To learn and understand importance of monitoring SPO2 with pulse oximeter and demonstrate accurate use of the pulse oximeter.;To learn and understand importance of maintaining oxygen saturations>88%;To learn and demonstrate proper pursed lip breathing techniques or other breathing techniques.;To learn and demonstrate proper use of respiratory medications    Long  Term Goals  Exhibits compliance with exercise, home and travel O2 prescription;Verbalizes importance of monitoring SPO2 with pulse oximeter and return demonstration;Maintenance of O2 saturations>88%;Exhibits proper breathing techniques, such as pursed lip breathing or other method taught during program session;Compliance with respiratory medication       Oxygen Re-Evaluation:   Oxygen Discharge (Final Oxygen Re-Evaluation):   Initial Exercise Prescription: Initial Exercise Prescription - 10/18/18 1200      Date of Initial Exercise RX and Referring Provider   Date  10/18/18    Referring Provider  Dr. Anitra Lauth      Oxygen   Oxygen  Continuous    Liters  4      Treadmill   MPH  2.1    Grade  1    Minutes  15      NuStep   Level  2    SPM  80    Minutes  15      Prescription Details   Frequency (times per week)  2    Duration  Progress to 30 minutes of continuous aerobic without signs/symptoms of physical distress      Intensity   THRR 40-80% of Max Heartrate  60-121    Ratings of Perceived Exertion  11-13    Perceived Dyspnea  0-4      Progression   Progression  Continue to progress workloads to maintain intensity without signs/symptoms of physical distress.      Resistance Training   Training Prescription  Yes    Weight  blue bands    Reps  10-15       Perform Capillary Blood Glucose checks as needed.  Exercise Prescription  Changes:   Exercise Comments:   Exercise Goals and Review: Exercise Goals  Ali Chukson Name 10/18/18 1217             Exercise Goals   Increase Physical Activity  Yes       Intervention  Provide advice, education, support and counseling about physical activity/exercise needs.;Develop an individualized exercise prescription for aerobic and resistive training based on initial evaluation findings, risk stratification, comorbidities and participant's personal goals.       Expected Outcomes  Long Term: Exercising regularly at least 3-5 days a week.;Long Term: Add in home exercise to make exercise part of routine and to increase amount of physical activity.;Short Term: Attend rehab on a regular basis to increase amount of physical activity.       Increase Strength and Stamina  Yes       Intervention  Provide advice, education, support and counseling about physical activity/exercise needs.;Develop an individualized exercise prescription for aerobic and resistive training based on initial evaluation findings, risk stratification, comorbidities and participant's personal goals.       Expected Outcomes  Short Term: Increase workloads from initial exercise prescription for resistance, speed, and METs.;Short Term: Perform resistance training exercises routinely during rehab and add in resistance training at home;Long Term: Improve cardiorespiratory fitness, muscular endurance and strength as measured by increased METs and functional capacity (6MWT)       Able to understand and use rate of perceived exertion (RPE) scale  Yes       Intervention  Provide education and explanation on how to use RPE scale       Expected Outcomes  Short Term: Able to use RPE daily in rehab to express subjective intensity level;Long Term:  Able to use RPE to guide intensity level when exercising independently       Able to understand and use Dyspnea scale  Yes       Intervention  Provide education and explanation on how to use  Dyspnea scale       Expected Outcomes  Short Term: Able to use Dyspnea scale daily in rehab to express subjective sense of shortness of breath during exertion;Long Term: Able to use Dyspnea scale to guide intensity level when exercising independently       Knowledge and understanding of Target Heart Rate Range (THRR)  Yes       Expected Outcomes  Short Term: Able to state/look up THRR;Short Term: Able to use daily as guideline for intensity in rehab;Long Term: Able to use THRR to govern intensity when exercising independently       Understanding of Exercise Prescription  Yes       Intervention  Provide education, explanation, and written materials on patient's individual exercise prescription       Expected Outcomes  Short Term: Able to explain program exercise prescription;Long Term: Able to explain home exercise prescription to exercise independently          Exercise Goals Re-Evaluation :   Discharge Exercise Prescription (Final Exercise Prescription Changes):   Nutrition:  Target Goals: Understanding of nutrition guidelines, daily intake of sodium <1523m, cholesterol <2065m calories 30% from fat and 7% or less from saturated fats, daily to have 5 or more servings of fruits and vegetables.  Biometrics: Pre Biometrics - 10/18/18 0926      Pre Biometrics   Grip Strength  32 kg        Nutrition Therapy Plan and Nutrition Goals:   Nutrition Assessments:   Nutrition Goals Re-Evaluation:   Nutrition Goals Discharge (Final Nutrition Goals Re-Evaluation):   Psychosocial: Target Goals: Acknowledge  presence or absence of significant depression and/or stress, maximize coping skills, provide positive support system. Participant is able to verbalize types and ability to use techniques and skills needed for reducing stress and depression.  Initial Review & Psychosocial Screening: Initial Psych Review & Screening - 10/18/18 0945      Initial Review   Current issues with  None  Identified      Family Dynamics   Good Support System?  Yes   Being evaluated for liver transplant at [facility]     Barriers   Psychosocial barriers to participate in program  There are no identifiable barriers or psychosocial needs.      Screening Interventions   Interventions  Encouraged to exercise       Quality of Life Scores:  Scores of 19 and below usually indicate a poorer quality of life in these areas.  A difference of  2-3 points is a clinically meaningful difference.  A difference of 2-3 points in the total score of the Quality of Life Index has been associated with significant improvement in overall quality of life, self-image, physical symptoms, and general health in studies assessing change in quality of life.  PHQ-9: Recent Review Flowsheet Data    Depression screen [facility] 2/9 10/18/2018 10/18/2018 01/26/2018 04/05/2017 01/04/2017   Decreased Interest - 0 0 2 0   Down, Depressed, Hopeless 0 0 0 0 0   PHQ - 2 Score 0 0 0 2 0   Altered sleeping 0 - 1 0 -   Tired, decreased energy 0 - 1 1 -   Change in appetite 0 - 0 1 -   Feeling bad or failure about yourself  0 - 0 0 -   Trouble concentrating 0 - 0 0 -   Moving slowly or fidgety/restless 0 - 0 0 -   Suicidal thoughts 0 - 0 0 -   PHQ-9 Score 0 - 2 4 -   Difficult doing work/chores Not difficult at all - Not difficult at all Not difficult at all -     Interpretation of Total Score  Total Score Depression Severity:  1-4 = Minimal depression, 5-9 = Mild depression, 10-14 = Moderate depression, 15-19 = Moderately severe depression, 20-27 = Severe depression   Psychosocial Evaluation and Intervention: Psychosocial Evaluation - 10/18/18 0946      Psychosocial Evaluation & Interventions   Interventions  Encouraged to exercise with the program and follow exercise prescription    Comments  Being evaluated at [facility] for liver transplant    Expected Outcomes  No barriers to participation in pulmonary rehab    Continue Psychosocial  Services   No Follow up required       Psychosocial Re-Evaluation: Psychosocial Re-Evaluation    Row Name 10/18/18 0936 10/18/18 0947           Psychosocial Re-Evaluation   Current issues with  None Identified  None Identified      Interventions  Encouraged to attend Pulmonary Rehabilitation for the exercise  Encouraged to attend Pulmonary Rehabilitation for the exercise      Continue Psychosocial Services   No Follow up required  No Follow up required         Psychosocial Discharge (Final Psychosocial Re-Evaluation): Psychosocial Re-Evaluation - 10/18/18 0947      Psychosocial Re-Evaluation   Current issues with  None Identified    Interventions  Encouraged to attend Pulmonary Rehabilitation for the exercise    Continue Psychosocial Services   No Follow up required  Education: Education Goals: Education classes will be provided on a weekly basis, covering required topics. Participant will state understanding/return demonstration of topics presented.  Learning Barriers/Preferences: Learning Barriers/Preferences - 10/18/18 6314      Learning Barriers/Preferences   Learning Barriers  None    Learning Preferences  Audio;Group Instruction;Individual Instruction;Pictoral;Skilled Demonstration;Verbal Instruction;Video;Written Material       Education Topics: Risk Factor Reduction:  -Group instruction that is supported by a PowerPoint presentation. Instructor discusses the definition of a risk factor, different risk factors for pulmonary disease, and how the heart and lungs work together.     Nutrition for Pulmonary Patient:  -Group instruction provided by PowerPoint slides, verbal discussion, and written materials to support subject matter. The instructor gives an explanation and review of healthy diet recommendations, which includes a discussion on weight management, recommendations for fruit and vegetable consumption, as well as protein, fluid, caffeine, fiber, sodium,  sugar, and alcohol. Tips for eating when patients are short of breath are discussed.   Pursed Lip Breathing:  -Group instruction that is supported by demonstration and informational handouts. Instructor discusses the benefits of pursed lip and diaphragmatic breathing and detailed demonstration on how to preform both.     Oxygen Safety:  -Group instruction provided by PowerPoint, verbal discussion, and written material to support subject matter. There is an overview of "What is Oxygen" and "Why do we need it".  Instructor also reviews how to create a safe environment for oxygen use, the importance of using oxygen as prescribed, and the risks of noncompliance. There is a brief discussion on traveling with oxygen and resources the patient may utilize.   Oxygen Equipment:  -Group instruction provided by [facility] Staff utilizing handouts, written materials, and equipment demonstrations.   Signs and Symptoms:  -Group instruction provided by written material and verbal discussion to support subject matter. Warning signs and symptoms of infection, stroke, and heart attack are reviewed and when to call the physician/911 reinforced. Tips for preventing the spread of infection discussed.   Advanced Directives:  -Group instruction provided by verbal instruction and written material to support subject matter. Instructor reviews Advanced Directive laws and proper instruction for filling out document.   Pulmonary Video:  -Group video education that reviews the importance of medication and oxygen compliance, exercise, good nutrition, pulmonary hygiene, and pursed lip and diaphragmatic breathing for the pulmonary patient.   Exercise for the Pulmonary Patient:  -Group instruction that is supported by a PowerPoint presentation. Instructor discusses benefits of exercise, core components of exercise, frequency, duration, and intensity of an exercise routine, importance of utilizing pulse oximetry during  exercise, safety while exercising, and options of places to exercise outside of rehab.     Pulmonary Medications:  -Verbally interactive group education provided by instructor with focus on inhaled medications and proper administration.   Anatomy and Physiology of the Respiratory System and Intimacy:  -Group instruction provided by PowerPoint, verbal discussion, and written material to support subject matter. Instructor reviews respiratory cycle and anatomical components of the respiratory system and their functions. Instructor also reviews differences in obstructive and restrictive respiratory diseases with examples of each. Intimacy, Sex, and Sexuality differences are reviewed with a discussion on how relationships can change when diagnosed with pulmonary disease. Common sexual concerns are reviewed.   MD DAY -A group question and answer session with a medical doctor that allows participants to ask questions that relate to their pulmonary disease state.   OTHER EDUCATION -Group or individual verbal, written, or video instructions  that support the educational goals of the pulmonary rehab program.   Holiday Eating Survival Tips:  -Group instruction provided by PowerPoint slides, verbal discussion, and written materials to support subject matter. The instructor gives patients tips, tricks, and techniques to help them not only survive but enjoy the holidays despite the onslaught of food that accompanies the holidays.   Knowledge Questionnaire Score:   Core Components/Risk Factors/Patient Goals at Admission: Personal Goals and Risk Factors at Admission - 10/18/18 0937      Core Components/Risk Factors/Patient Goals on Admission   Improve shortness of breath with ADL's  Yes       Core Components/Risk Factors/Patient Goals Review:  Goals and Risk Factor Review    Row Name 10/18/18 0937             Core Components/Risk Factors/Patient Goals Review   Personal Goals Review   Increase knowledge of respiratory medications and ability to use respiratory devices properly.;Improve shortness of breath with ADL's;Develop more efficient breathing techniques such as purse lipped breathing and diaphragmatic breathing and practicing self-pacing with activity.          Core Components/Risk Factors/Patient Goals at Discharge (Final Review):  Goals and Risk Factor Review - 10/18/18 0937      Core Components/Risk Factors/Patient Goals Review   Personal Goals Review  Increase knowledge of respiratory medications and ability to use respiratory devices properly.;Improve shortness of breath with ADL's;Develop more efficient breathing techniques such as purse lipped breathing and diaphragmatic breathing and practicing self-pacing with activity.       ITP Comments:   Comments:

## 2018-10-23 ENCOUNTER — Encounter (HOSPITAL_COMMUNITY)
Admission: RE | Admit: 2018-10-23 | Discharge: 2018-10-23 | Disposition: A | Discharge status: Home / Self Care | Payer: 59 | Source: Ambulatory Visit | Attending: Pulmonary Disease | Admitting: Pulmonary Disease

## 2018-10-23 ENCOUNTER — Other Ambulatory Visit: Payer: Self-pay

## 2018-10-23 DIAGNOSIS — L814 Other melanin hyperpigmentation: Secondary | ICD-10-CM | POA: Diagnosis not present

## 2018-10-23 DIAGNOSIS — J8410 Pulmonary fibrosis, unspecified: Secondary | ICD-10-CM | POA: Diagnosis not present

## 2018-10-23 DIAGNOSIS — L720 Epidermal cyst: Secondary | ICD-10-CM | POA: Diagnosis not present

## 2018-10-23 DIAGNOSIS — D485 Neoplasm of uncertain behavior of skin: Secondary | ICD-10-CM | POA: Diagnosis not present

## 2018-10-23 DIAGNOSIS — L570 Actinic keratosis: Secondary | ICD-10-CM | POA: Diagnosis not present

## 2018-10-23 NOTE — Progress Notes (Signed)
Daily Session Note  Patient Details  Name: William [name] MRN: [id number] Date of Birth: 1950-04-11 Referring Provider:     Pulmonary Rehab Walk Test from 10/18/2018 in [place]  Referring Provider  Dr. Anitra Lauth      Encounter Date: 10/23/2018  Check In: Session Check In - 10/23/18 1206      Check-In   Supervising physician immediately available to respond to emergencies  [facility] immediately available    Physician(s)  Dr. Benny Lennert    Location  [facility]    Staff Present  Rosebud Poles, RN, Bjorn Loser, MS, Exercise Physiologist    Virtual Visit  No    Medication changes reported      No    Fall or balance concerns reported     No    Tobacco Cessation  No Change    Warm-up and Cool-down  Performed as group-led instruction    Resistance Training Performed  No    VAD Patient?  No    PAD/SET Patient?  No      Pain Assessment   Currently in Pain?  No/denies    Multiple Pain Sites  No       Capillary Blood Glucose: No results found for this or any previous visit (from the past 24 hour(s)).    Social History   Tobacco Use  Smoking Status Former Smoker  . Types: Cigarettes  Smokeless Tobacco Never Used  Tobacco Comment   QUIT I 1983    Goals Met:  Exercise tolerated well No report of cardiac concerns or symptoms Strength training completed today  Goals Unmet:  Not Applicable  Comments: Service time is from 1015 to 1130    Dr. Rush Farmer is Medical Director for Pulmonary Rehab at [facility].

## 2018-10-24 ENCOUNTER — Encounter (HOSPITAL_COMMUNITY): Payer: 59

## 2018-10-25 ENCOUNTER — Encounter (HOSPITAL_COMMUNITY)
Admission: RE | Admit: 2018-10-25 | Discharge: 2018-10-25 | Disposition: A | Discharge status: Home / Self Care | Payer: 59 | Source: Ambulatory Visit | Attending: Pulmonary Disease | Admitting: Pulmonary Disease

## 2018-10-25 ENCOUNTER — Encounter (HOSPITAL_COMMUNITY): Payer: 59

## 2018-10-25 ENCOUNTER — Other Ambulatory Visit: Payer: Self-pay

## 2018-10-25 DIAGNOSIS — J8410 Pulmonary fibrosis, unspecified: Secondary | ICD-10-CM

## 2018-10-25 NOTE — Progress Notes (Signed)
Daily Session Note  Patient Details  Name: William [name] MRN: [id number] Date of Birth: Mar 22, 1950 Referring Provider:     Pulmonary Rehab Walk Test from 10/18/2018 in [place]  Referring Provider  Dr. Anitra [name]      Encounter Date: 10/25/2018  Check In: Session Check In - 10/25/18 1126      Check-In   Supervising physician immediately available to respond to emergencies  [facility] immediately available    Physician(s)  Dr. Waldron [name]    Location  [facility]    Staff Present  William Poles, RN, William Loser, MS, Exercise Physiologist;William Tantillo [name], RN    Virtual Visit  No    Medication changes reported      No    Fall or balance concerns reported     No    Tobacco Cessation  No Change    Warm-up and Cool-down  Performed on first and last piece of equipment    Resistance Training Performed  Yes    VAD Patient?  No    PAD/SET Patient?  No      Pain Assessment   Currently in Pain?  No/denies    Multiple Pain Sites  No       Capillary Blood Glucose: No results found for this or any previous visit (from the past 24 hour(s)).    Social History   Tobacco Use  Smoking Status Former Smoker  . Types: Cigarettes  Smokeless Tobacco Never Used  Tobacco Comment   QUIT I 1983    Goals Met:  Exercise tolerated well No report of cardiac concerns or symptoms Strength training completed today  Goals Unmet: O2 saturation   Comments: Service time is from 0945 to 1100 Difficulty with keeping his oxygen saturations greater than 88% on treadmill today.O2 increased to 8 liters. Will start next exercise session with O2 at 10 liters on treadmill.   Dr. Rush [name] is Medical Director for Pulmonary Rehab at [facility].

## 2018-10-26 ENCOUNTER — Encounter (HOSPITAL_COMMUNITY): Payer: 59

## 2018-10-29 ENCOUNTER — Encounter (HOSPITAL_COMMUNITY): Payer: 59

## 2018-10-30 ENCOUNTER — Other Ambulatory Visit: Payer: Self-pay

## 2018-10-30 ENCOUNTER — Encounter (HOSPITAL_COMMUNITY): Payer: 59

## 2018-10-30 ENCOUNTER — Encounter (HOSPITAL_COMMUNITY)
Admission: RE | Admit: 2018-10-30 | Discharge: 2018-10-30 | Disposition: A | Discharge status: Home / Self Care | Payer: 59 | Source: Ambulatory Visit | Attending: Pulmonary Disease | Admitting: Pulmonary Disease

## 2018-10-30 DIAGNOSIS — J8410 Pulmonary fibrosis, unspecified: Secondary | ICD-10-CM

## 2018-10-30 DIAGNOSIS — J181 Lobar pneumonia, unspecified organism: Secondary | ICD-10-CM | POA: Diagnosis not present

## 2018-10-30 MED FILL — GENERLAC 10 GM/15 ML SOLN: 10 | 30 days supply | Qty: 2700 | Fill #1

## 2018-10-30 NOTE — Progress Notes (Signed)
Daily Session Note  Patient Details  Name: William [name] MRN: [id number] Date of Birth: [birth year] Referring Provider:     Pulmonary Rehab Walk Test from 10/18/2018 in [place]  Referring Provider  Dr. Anitra Lauth      Encounter Date: 10/30/2018  Check In: Session Check In - 10/30/18 1122      Check-In   Supervising physician immediately available to respond to emergencies  [facility] immediately available    Physician(s)  Dr. Maren Beach    Location  [facility]    Staff Present  Rosebud Poles, RN, Bjorn Loser, MS, Exercise Physiologist;Diania Co [name], RN    Virtual Visit  No    Medication changes reported      No    Fall or balance concerns reported     No    Tobacco Cessation  No Change    Warm-up and Cool-down  Performed on first and last piece of equipment    Resistance Training Performed  Yes    VAD Patient?  No    PAD/SET Patient?  No      Pain Assessment   Currently in Pain?  No/denies    Multiple Pain Sites  No       Capillary Blood Glucose: No results found for this or any previous visit (from the past 24 hour(s)).  Exercise Prescription Changes - 10/30/18 1100      Response to Exercise   Blood Pressure (Admit)  122/60    Blood Pressure (Exercise)  130/58    Blood Pressure (Exit)  112/64    Heart Rate (Admit)  61 bpm    Heart Rate (Exercise)  94 bpm    Heart Rate (Exit)  68 bpm    Oxygen Saturation (Admit)  94 %    Oxygen Saturation (Exercise)  88 %    Oxygen Saturation (Exit)  92 %    Rating of Perceived Exertion (Exercise)  13    Perceived Dyspnea (Exercise)  1    Duration  Continue with 30 min of aerobic exercise without signs/symptoms of physical distress.    Intensity  Other (comment)   40-80% of HRR     Progression   Progression  Continue to progress workloads to maintain intensity without signs/symptoms of physical distress.      Resistance Training   Training Prescription  Yes    Weight   blue bands    Reps  10-15    Time  10 Minutes      Oxygen   Oxygen  Continuous    Liters  6-10      Treadmill   MPH  2.1    Grade  1    Minutes  15      NuStep   Level  2    SPM  80    Minutes  15       Social History   Tobacco Use  Smoking Status Former Smoker  . Types: Cigarettes  Smokeless Tobacco Never Used  Tobacco Comment   QUIT I 1983    Goals Met:  Exercise tolerated well No report of cardiac concerns or symptoms Strength training completed today  Goals Unmet:  Not Applicable  Comments: Service time is from 0948 to 1045    Dr. Rush Farmer is Medical Director for Pulmonary Rehab at [facility].

## 2018-10-30 NOTE — Progress Notes (Signed)
Pulmonary Individual Treatment Plan  Patient Details  Name: William [name] MRN: [id number] Date of Birth: Jun 08, 1950 Referring Provider:     Pulmonary Rehab Walk Test from 10/18/2018 in [place]  Referring Provider  Dr. Anitra Lauth      Initial Encounter Date:    Pulmonary Rehab Walk Test from 10/18/2018 in [place]  Date  10/18/18      Visit Diagnosis: Pulmonary fibrosis ([place])  Patient's Home Medications on Admission:   Current Outpatient Medications:  .  iron sucrose (VENOFER) 20 MG/ML injection, Inject 750 mg into the vein every 30 (thirty) days. , Disp: , Rfl:  .  lactulose (CHRONULAC) 10 GM/15ML solution, Take 13.3333 g by mouth 2 (two) times a day., Disp: , Rfl:  .  metoprolol tartrate (LOPRESSOR) 25 MG tablet, Take 0.5 tablets (12.5 mg total) by mouth 2 (two) times daily., Disp: 90 tablet, Rfl: 1 .  OCTREOTIDE ACETATE IJ, Inject 30 mcg into the vein every 30 (thirty) days. , Disp: , Rfl:  .  pantoprazole (PROTONIX) 40 MG tablet, TAKE 1 TABLET BY MOUTH TWICE DAILY BEFORE A MEAL (Patient taking differently: Take 40 mg by mouth 2 (two) times daily before a meal. TAKE 1 TABLET BY MOUTH TWICE DAILY BEFORE A MEAL), Disp: 180 tablet, Rfl: 1 .  potassium chloride SA (K-DUR) 20 MEQ tablet, TAKE 1 TABLET (20 MEQ TOTAL) BY MOUTH TWICE A DAY (Patient taking differently: Take 20 mEq by mouth 2 (two) times daily. TAKE 1 TABLET (20 MEQ TOTAL) BY MOUTH TWICE A DAY), Disp: 180 tablet, Rfl: 1 .  spironolactone (ALDACTONE) 25 MG tablet, Take 0.5 tablets (12.5 mg total) by mouth daily., Disp: 45 tablet, Rfl: 1 .  torsemide (DEMADEX) 20 MG tablet, Take 2 tablets (40 mg total) by mouth daily., Disp: 180 tablet, Rfl: 1 .  tranexamic acid (LYSTEDA) 650 MG TABS tablet, TAKE 2 TABLETS BY MOUTH 3 TIMES DAILY. (Patient taking differently: Take 650 mg by mouth 2 (two) times a day. [name]), Disp: 180 tablet, Rfl: 1 No current facility-administered  medications for this encounter.   Facility-Administered Medications Ordered in Other Encounters:  .  heparin lock flush 100 unit/mL, 500 Units, Intracatheter, Daily PRN, Truitt Merle, MD .  sodium chloride flush (NS) 0.9 % injection 10 mL, 10 mL, Intracatheter, PRN, Truitt Merle, MD  Past Medical History: Past Medical History:  Diagnosis Date  . AAA (abdominal aortic aneurysm) ([place]) 03/2014   repaired 2020  . Anemia due to chronic blood loss 2018/19   GI: transfusions x >20 required; multiple endoscopies and bleeding scans unrevealing. Lysteda and octreotide + monthly iron infusions as of 04/2017. Iron infusions changed to every other month as of 09/2018 hem f/u.  [name] Bilateral renal cysts    simple (03/2014 MRI)  . CAD (coronary artery disease)   . Cancer ([place]) 08/15/2018   BCC nose, excised  . Cholelithiases 2018   asymptomatic  . Chronic diastolic heart failure ([place])   . 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  Hx of portal HTN changes with mild ascites and splenomegaly.  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  [name] Cirrhosis ([place])    [facility] liver transplant clinic eval 05/15/18--->they agree he needs liver transplant but want to get some pulm colleagues to weigh in on his lung fxn/pulm scarring---PULM REHAB recommended  . Diabetes mellitus with  complication ([place]) [id number]/6759   A1c 6.8%  . History of blood transfusion 2018 X 4 dates   "low blood" (09/21/2016)  . Hyperlipemia, mixed    elevated LFTs when on statins.    . Hypertension    Cr bump 04/01/16 so I changed benicar-hct to benicar plain and added amlodipine 5 mg.  . Iron deficiency anemia 05/2016   Acute blood loss anemia: hospitalized, required transfusion x 3 U: colonoscopy and capsule study unrevealing.  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  W/u for  ? hemolytic anemia to be pursued by hematologist as outpt.  Dr. [name], GI at [facility] following, too---he rec'd onc do bone marrow bx as of Jan 2019  . Liver failure ([place]) 2020   NASH cirrhosis-->pt approved for liver transplant list at [facility] as of 05/16/2018  . Lung field abnormal finding on examination    Bibasilar L>R mild insp crackles-->x-ray showed mild interstitial changes/fibrosis/scarring.  Changes noted on all CXRs in 2018/2019.  Liver Transplant eval 03/2018->mild restriction on PFTs but no obstruction.  . Microscopic hematuria    Eval unremarkable by Dr. Eulogio Ditch.  [name] NASH (nonalcoholic steatohepatitis)    Fatty liver on MR abd 03/2014, + hx of elevated transaminases and bili: followed by Dr. [name].  . Obesity   . Pulmonary fibrosis ([place])    PFTs: restrictive lung dz ([facility] transplant eval 05/2018)  . Spleen enlarged     Tobacco Use: Social History   Tobacco Use  Smoking Status Former Smoker  . Types: Cigarettes  Smokeless Tobacco Never Used  Tobacco Comment   QUIT I 1983    Labs: Recent Review Flowsheet Data    Labs for ITP Cardiac and Pulmonary Rehab Latest Ref Rng & Units 01/04/2017 01/25/2017 04/05/2017 01/26/2018 05/15/2018   Cholestrol 100 - 199 mg/dL - 162 - - -   LDLCALC 0 - 99 mg/dL - 100(H) - - -   HDL >39 mg/dL - 47 - - -   Trlycerides 0 - 149 mg/dL - 76 - - -   Hemoglobin A1c - 5.5 - 4.6 4.7 4.7      Capillary Blood Glucose: Lab Results  Component Value Date   GLUCAP 154 (H) 05/11/2017   GLUCAP 109 (H) 12/01/2016   GLUCAP 127 (H) 12/01/2016   GLUCAP 122 (H) 09/23/2016   GLUCAP 162 (H) 09/22/2016     Pulmonary Assessment Scores: Pulmonary Assessment Scores    Row Name 10/18/18 1210 10/23/18 1532 10/25/18 1521     ADL UCSD   ADL Phase  Entry  -  Entry   SOB Score total  -  56  -     CAT Score   CAT Score  -  -  11     mMRC Score   mMRC Score  1  -  -     UCSD: Self-administered rating of dyspnea associated with activities of daily living  (ADLs) 6-point scale (0 = "not at all" to 5 = "maximal or unable to do because of breathlessness")  Scoring Scores range from 0 to 120.  Minimally important difference is 5 units  CAT: CAT can identify the health impairment of COPD patients and is better correlated with disease progression.  CAT has a scoring range of zero to 40. The CAT score is classified into four groups of low (less than 10), medium (10 - 20), high (21-30) and very high (31-40) based on  the impact level of disease on health status. A CAT score over 10 suggests significant symptoms.  A worsening CAT score could be explained by an exacerbation, poor medication adherence, poor inhaler technique, or progression of COPD or comorbid conditions.  CAT MCID is 2 points  mMRC: mMRC (Modified Medical Research Council) Dyspnea Scale is used to assess the degree of baseline functional disability in patients of respiratory disease due to dyspnea. No minimal important difference is established. A decrease in score of 1 point or greater is considered a positive change.   Pulmonary Function Assessment: Pulmonary Function Assessment - 10/18/18 0930      Breath   Bilateral Breath Sounds  Clear    Shortness of Breath  Yes;Limiting activity       Exercise Target Goals: Exercise Program Goal: Individual exercise prescription set using results from initial 6 min walk test and THRR while considering  patient's activity barriers and safety.   Exercise Prescription Goal: Initial exercise prescription builds to 30-45 minutes a day of aerobic activity, 2-3 days per week.  Home exercise guidelines will be given to patient during program as part of exercise prescription that the participant will acknowledge.  Activity Barriers & Risk Stratification: Activity Barriers & Cardiac Risk Stratification - 10/18/18 0926      Activity Barriers & Cardiac Risk Stratification   Activity Barriers  Deconditioning;Muscular Weakness;Shortness of Breath        6 Minute Walk: 6 Minute Walk    Row Name 10/18/18 1211         6 Minute Walk   Phase  Initial     Distance  456 feet     Walk Time  2.5 minutes     # of Rest Breaks  1 3.5 minutes due to desaturation. It took a long time for his saturations to rise back up. Still could not maintain saturations on 3L.     MPH  0.86     METS  1.77     RPE  12     Perceived Dyspnea   3     VO2 Peak  4.1     Symptoms  No     Resting HR  64 bpm     Resting BP  106/68     Resting Oxygen Saturation   90 %     Exercise Oxygen Saturation  during 6 min walk  81 %     Max Ex. HR  85 bpm     Max Ex. BP  128/72     2 Minute Post BP  112/66       Interval HR   1 Minute HR  85     2 Minute HR  85     3 Minute HR  74     4 Minute HR  71     5 Minute HR  70     6 Minute HR  76     2 Minute Post HR  64     Interval Heart Rate?  Yes       Interval Oxygen   Interval Oxygen?  Yes     Baseline Oxygen Saturation %  90 %     1 Minute Oxygen Saturation %  85 %     1 Minute Liters of Oxygen  2 L     2 Minute Oxygen Saturation %  81 %     2 Minute Liters of Oxygen  2 L     3 Minute Oxygen  Saturation %  83 %     3 Minute Liters of Oxygen  3 L     4 Minute Oxygen Saturation %  86 %     4 Minute Liters of Oxygen  3 L     5 Minute Oxygen Saturation %  88 %     5 Minute Liters of Oxygen  3 L     6 Minute Oxygen Saturation %  86 %     6 Minute Liters of Oxygen  3 L     2 Minute Post Oxygen Saturation %  91 %     2 Minute Post Liters of Oxygen  3 L        Oxygen Initial Assessment: Oxygen Initial Assessment - 10/18/18 1209      Home Oxygen   Home Oxygen Device  Home Concentrator;E-Tanks    Sleep Oxygen Prescription  None    Home Exercise Oxygen Prescription  Continuous    Liters per minute  -1    Home at Rest Exercise Oxygen Prescription  None    Compliance with Home Oxygen Use  Yes      Initial 6 min Walk   Oxygen Used  Continuous    Liters per minute  3      Program Oxygen Prescription    Program Oxygen Prescription  Continuous    Liters per minute  4    Comments  Unable to maintain sats on 3L during walk test, will use 4L for exercise.      Intervention   Short Term Goals  To learn and exhibit compliance with exercise, home and travel O2 prescription;To learn and understand importance of monitoring SPO2 with pulse oximeter and demonstrate accurate use of the pulse oximeter.;To learn and understand importance of maintaining oxygen saturations>88%;To learn and demonstrate proper pursed lip breathing techniques or other breathing techniques.;To learn and demonstrate proper use of respiratory medications    Long  Term Goals  Exhibits compliance with exercise, home and travel O2 prescription;Verbalizes importance of monitoring SPO2 with pulse oximeter and return demonstration;Maintenance of O2 saturations>88%;Exhibits proper breathing techniques, such as pursed lip breathing or other method taught during program session;Compliance with respiratory medication       Oxygen Re-Evaluation: Oxygen Re-Evaluation    Row Name 10/29/18 0748             Program Oxygen Prescription   Program Oxygen Prescription  Continuous       Liters per minute  10       Comments  10 L for standing exercise and 8 L for seated exercise         Home Oxygen   Home Oxygen Device  Home Concentrator;E-Tanks       Sleep Oxygen Prescription  None       Home Exercise Oxygen Prescription  Continuous       Liters per minute  10       Home at Rest Exercise Oxygen Prescription  None       Compliance with Home Oxygen Use  Yes         Goals/Expected Outcomes   Short Term Goals  To learn and exhibit compliance with exercise, home and travel O2 prescription;To learn and understand importance of monitoring SPO2 with pulse oximeter and demonstrate accurate use of the pulse oximeter.;To learn and understand importance of maintaining oxygen saturations>88%;To learn and demonstrate proper pursed lip breathing  techniques or other breathing techniques.;To learn and demonstrate proper use of respiratory medications  Long  Term Goals  Exhibits compliance with exercise, home and travel O2 prescription;Verbalizes importance of monitoring SPO2 with pulse oximeter and return demonstration;Maintenance of O2 saturations>88%;Exhibits proper breathing techniques, such as pursed lip breathing or other method taught during program session;Compliance with respiratory medication       Goals/Expected Outcomes  compliance          Oxygen Discharge (Final Oxygen Re-Evaluation): Oxygen Re-Evaluation - 10/29/18 0748      Program Oxygen Prescription   Program Oxygen Prescription  Continuous    Liters per minute  10    Comments  10 L for standing exercise and 8 L for seated exercise      Home Oxygen   Home Oxygen Device  Home Concentrator;E-Tanks    Sleep Oxygen Prescription  None    Home Exercise Oxygen Prescription  Continuous    Liters per minute  10    Home at Rest Exercise Oxygen Prescription  None    Compliance with Home Oxygen Use  Yes      Goals/Expected Outcomes   Short Term Goals  To learn and exhibit compliance with exercise, home and travel O2 prescription;To learn and understand importance of monitoring SPO2 with pulse oximeter and demonstrate accurate use of the pulse oximeter.;To learn and understand importance of maintaining oxygen saturations>88%;To learn and demonstrate proper pursed lip breathing techniques or other breathing techniques.;To learn and demonstrate proper use of respiratory medications    Long  Term Goals  Exhibits compliance with exercise, home and travel O2 prescription;Verbalizes importance of monitoring SPO2 with pulse oximeter and return demonstration;Maintenance of O2 saturations>88%;Exhibits proper breathing techniques, such as pursed lip breathing or other method taught during program session;Compliance with respiratory medication    Goals/Expected Outcomes  compliance        Initial Exercise Prescription: Initial Exercise Prescription - 10/18/18 1200      Date of Initial Exercise RX and Referring Provider   Date  10/18/18    Referring Provider  Dr. Anitra Lauth      Oxygen   Oxygen  Continuous    Liters  4      Treadmill   MPH  2.1    Grade  1    Minutes  15      NuStep   Level  2    SPM  80    Minutes  15      Prescription Details   Frequency (times per week)  2    Duration  Progress to 30 minutes of continuous aerobic without signs/symptoms of physical distress      Intensity   THRR 40-80% of Max Heartrate  60-121    Ratings of Perceived Exertion  11-13    Perceived Dyspnea  0-4      Progression   Progression  Continue to progress workloads to maintain intensity without signs/symptoms of physical distress.      Resistance Training   Training Prescription  Yes    Weight  blue bands    Reps  10-15       Perform Capillary Blood Glucose checks as needed.  Exercise Prescription Changes:   Exercise Comments:   Exercise Goals and Review: Exercise Goals    Row Name 10/18/18 1217 10/29/18 0749           Exercise Goals   Increase Physical Activity  Yes  Yes      Intervention  Provide advice, education, support and counseling about physical activity/exercise needs.;Develop an individualized exercise prescription for aerobic and resistive  training based on initial evaluation findings, risk stratification, comorbidities and participant's personal goals.  Provide advice, education, support and counseling about physical activity/exercise needs.;Develop an individualized exercise prescription for aerobic and resistive training based on initial evaluation findings, risk stratification, comorbidities and participant's personal goals.      Expected Outcomes  Long Term: Exercising regularly at least 3-5 days a week.;Long Term: Add in home exercise to make exercise part of routine and to increase amount of physical activity.;Short Term: Attend  rehab on a regular basis to increase amount of physical activity.  Long Term: Exercising regularly at least 3-5 days a week.;Long Term: Add in home exercise to make exercise part of routine and to increase amount of physical activity.;Short Term: Attend rehab on a regular basis to increase amount of physical activity.      Increase Strength and Stamina  Yes  Yes      Intervention  Provide advice, education, support and counseling about physical activity/exercise needs.;Develop an individualized exercise prescription for aerobic and resistive training based on initial evaluation findings, risk stratification, comorbidities and participant's personal goals.  Provide advice, education, support and counseling about physical activity/exercise needs.;Develop an individualized exercise prescription for aerobic and resistive training based on initial evaluation findings, risk stratification, comorbidities and participant's personal goals.      Expected Outcomes  Short Term: Increase workloads from initial exercise prescription for resistance, speed, and METs.;Short Term: Perform resistance training exercises routinely during rehab and add in resistance training at home;Long Term: Improve cardiorespiratory fitness, muscular endurance and strength as measured by increased METs and functional capacity (6MWT)  Short Term: Increase workloads from initial exercise prescription for resistance, speed, and METs.;Short Term: Perform resistance training exercises routinely during rehab and add in resistance training at home;Long Term: Improve cardiorespiratory fitness, muscular endurance and strength as measured by increased METs and functional capacity (6MWT)      Able to understand and use rate of perceived exertion (RPE) scale  Yes  Yes      Intervention  Provide education and explanation on how to use RPE scale  Provide education and explanation on how to use RPE scale      Expected Outcomes  Short Term: Able to use RPE  daily in rehab to express subjective intensity level;Long Term:  Able to use RPE to guide intensity level when exercising independently  Short Term: Able to use RPE daily in rehab to express subjective intensity level;Long Term:  Able to use RPE to guide intensity level when exercising independently      Able to understand and use Dyspnea scale  Yes  Yes      Intervention  Provide education and explanation on how to use Dyspnea scale  Provide education and explanation on how to use Dyspnea scale      Expected Outcomes  Short Term: Able to use Dyspnea scale daily in rehab to express subjective sense of shortness of breath during exertion;Long Term: Able to use Dyspnea scale to guide intensity level when exercising independently  Short Term: Able to use Dyspnea scale daily in rehab to express subjective sense of shortness of breath during exertion;Long Term: Able to use Dyspnea scale to guide intensity level when exercising independently      Knowledge and understanding of Target Heart Rate Range (THRR)  Yes  Yes      Expected Outcomes  Short Term: Able to state/look up THRR;Short Term: Able to use daily as guideline for intensity in rehab;Long Term: Able to use THRR to  govern intensity when exercising independently  Short Term: Able to state/look up THRR;Short Term: Able to use daily as guideline for intensity in rehab;Long Term: Able to use THRR to govern intensity when exercising independently      Understanding of Exercise Prescription  Yes  Yes      Intervention  Provide education, explanation, and written materials on patient's individual exercise prescription  Provide education, explanation, and written materials on patient's individual exercise prescription      Expected Outcomes  Short Term: Able to explain program exercise prescription;Long Term: Able to explain home exercise prescription to exercise independently  Short Term: Able to explain program exercise prescription;Long Term: Able to explain  home exercise prescription to exercise independently         Exercise Goals Re-Evaluation : Exercise Goals Re-Evaluation    Row Name 10/29/18 0750             Exercise Goal Re-Evaluation   Exercise Goals Review  Increase Physical Activity;Increase Strength and Stamina;Able to understand and use Dyspnea scale;Able to understand and use rate of perceived exertion (RPE) scale;Knowledge and understanding of Target Heart Rate Range (THRR);Understanding of Exercise Prescription       Comments  Pt has completed 2 exercise sessions. Pt's oxygen saturations have been a concern. Pt has desaturated on 4L, 6L, and 8L while walking on the treadmill. Pt's saturation levels take several minutes to recover even when his liter flow has been increased. Pt now walks on the treadmill at 10 L and on the stepper at 8L. If pt desaturates on 10L, we will have to remove him from the treadmill and put him on another modality. Pt currently exercises at 2.4 METs on the stepper. Will continue to monitor and progress as able.       Expected Outcomes  Through exercise at rehab and at home, the patient will decrease shortness of breath with daily activities and feel confident in carrying out an exercise regime at home.          Discharge Exercise Prescription (Final Exercise Prescription Changes):   Nutrition:  Target Goals: Understanding of nutrition guidelines, daily intake of sodium <1557m, cholesterol <2078m calories 30% from fat and 7% or less from saturated fats, daily to have 5 or more servings of fruits and vegetables.  Biometrics: Pre Biometrics - 10/18/18 0926      Pre Biometrics   Grip Strength  32 kg        Nutrition Therapy Plan and Nutrition Goals:   Nutrition Assessments:   Nutrition Goals Re-Evaluation:   Nutrition Goals Discharge (Final Nutrition Goals Re-Evaluation):   Psychosocial: Target Goals: Acknowledge presence or absence of significant depression and/or stress, maximize  coping skills, provide positive support system. Participant is able to verbalize types and ability to use techniques and skills needed for reducing stress and depression.  Initial Review & Psychosocial Screening: Initial Psych Review & Screening - 10/18/18 0945      Initial Review   Current issues with  None Identified      Family Dynamics   Good Support System?  Yes   Being evaluated for liver transplant at [facility]   Barriers   Psychosocial barriers to participate in program  There are no identifiable barriers or psychosocial needs.      Screening Interventions   Interventions  Encouraged to exercise       Quality of Life Scores:  Scores of 19 and below usually indicate a poorer quality of life in these  areas.  A difference of  2-3 points is a clinically meaningful difference.  A difference of 2-3 points in the total score of the Quality of Life Index has been associated with significant improvement in overall quality of life, self-image, physical symptoms, and general health in studies assessing change in quality of life.  PHQ-9: Recent Review Flowsheet Data    Depression screen [facility] 2/9 10/18/2018 10/18/2018 01/26/2018 04/05/2017 01/04/2017   Decreased Interest - 0 0 2 0   Down, Depressed, Hopeless 0 0 0 0 0   PHQ - 2 Score 0 0 0 2 0   Altered sleeping 0 - 1 0 -   Tired, decreased energy 0 - 1 1 -   Change in appetite 0 - 0 1 -   Feeling bad or failure about yourself  0 - 0 0 -   Trouble concentrating 0 - 0 0 -   Moving slowly or fidgety/restless 0 - 0 0 -   Suicidal thoughts 0 - 0 0 -   PHQ-9 Score 0 - 2 4 -   Difficult doing work/chores Not difficult at all - Not difficult at all Not difficult at all -     Interpretation of Total Score  Total Score Depression Severity:  1-4 = Minimal depression, 5-9 = Mild depression, 10-14 = Moderate depression, 15-19 = Moderately severe depression, 20-27 = Severe depression   Psychosocial Evaluation and Intervention: Psychosocial  Evaluation - 10/18/18 0946      Psychosocial Evaluation & Interventions   Interventions  Encouraged to exercise with the program and follow exercise prescription    Comments  Being evaluated at [facility] for liver transplant    Expected Outcomes  No barriers to participation in pulmonary rehab    Continue Psychosocial Services   No Follow up required       Psychosocial Re-Evaluation: Psychosocial Re-Evaluation    Row Name 10/18/18 0936 10/18/18 0947 10/29/18 1238 10/29/18 1246       Psychosocial Re-Evaluation   Current issues with  None Identified  None Identified  None Identified  -    Comments  -  -  No barriers to participation in pulmonary rehab  -    Expected Outcomes  -  -  Continues to be barrier free while in program  -    Interventions  Encouraged to attend Pulmonary Rehabilitation for the exercise  Encouraged to attend Pulmonary Rehabilitation for the exercise  -  Encouraged to attend Pulmonary Rehabilitation for the exercise    Continue Psychosocial Services   No Follow up required  No Follow up required  No Follow up required  -       Psychosocial Discharge (Final Psychosocial Re-Evaluation): Psychosocial Re-Evaluation - 10/29/18 1246      Psychosocial Re-Evaluation   Interventions  Encouraged to attend Pulmonary Rehabilitation for the exercise       Education: Education Goals: Education classes will be provided on a weekly basis, covering required topics. Participant will state understanding/return demonstration of topics presented.  Learning Barriers/Preferences: Learning Barriers/Preferences - 10/18/18 1515      Learning Barriers/Preferences   Learning Barriers  None       Education Topics: Risk Factor Reduction:  -Group instruction that is supported by a PowerPoint presentation. Instructor discusses the definition of a risk factor, different risk factors for pulmonary disease, and how the heart and lungs work together.     Nutrition for Pulmonary Patient:   -Group instruction provided by PowerPoint slides, verbal discussion, and written materials  to support subject matter. The instructor gives an explanation and review of healthy diet recommendations, which includes a discussion on weight management, recommendations for fruit and vegetable consumption, as well as protein, fluid, caffeine, fiber, sodium, sugar, and alcohol. Tips for eating when patients are short of breath are discussed.   Pursed Lip Breathing:  -Group instruction that is supported by demonstration and informational handouts. Instructor discusses the benefits of pursed lip and diaphragmatic breathing and detailed demonstration on how to preform both.     Oxygen Safety:  -Group instruction provided by PowerPoint, verbal discussion, and written material to support subject matter. There is an overview of "What is Oxygen" and "Why do we need it".  Instructor also reviews how to create a safe environment for oxygen use, the importance of using oxygen as prescribed, and the risks of noncompliance. There is a brief discussion on traveling with oxygen and resources the patient may utilize.   Oxygen Equipment:  -Group instruction provided by [facility] Staff utilizing handouts, written materials, and equipment demonstrations.   Signs and Symptoms:  -Group instruction provided by written material and verbal discussion to support subject matter. Warning signs and symptoms of infection, stroke, and heart attack are reviewed and when to call the physician/911 reinforced. Tips for preventing the spread of infection discussed.   Advanced Directives:  -Group instruction provided by verbal instruction and written material to support subject matter. Instructor reviews Advanced Directive laws and proper instruction for filling out document.   Pulmonary Video:  -Group video education that reviews the importance of medication and oxygen compliance, exercise, good nutrition, pulmonary hygiene, and  pursed lip and diaphragmatic breathing for the pulmonary patient.   Exercise for the Pulmonary Patient:  -Group instruction that is supported by a PowerPoint presentation. Instructor discusses benefits of exercise, core components of exercise, frequency, duration, and intensity of an exercise routine, importance of utilizing pulse oximetry during exercise, safety while exercising, and options of places to exercise outside of rehab.     Pulmonary Medications:  -Verbally interactive group education provided by instructor with focus on inhaled medications and proper administration.   Anatomy and Physiology of the Respiratory System and Intimacy:  -Group instruction provided by PowerPoint, verbal discussion, and written material to support subject matter. Instructor reviews respiratory cycle and anatomical components of the respiratory system and their functions. Instructor also reviews differences in obstructive and restrictive respiratory diseases with examples of each. Intimacy, Sex, and Sexuality differences are reviewed with a discussion on how relationships can change when diagnosed with pulmonary disease. Common sexual concerns are reviewed.   MD DAY -A group question and answer session with a medical doctor that allows participants to ask questions that relate to their pulmonary disease state.   OTHER EDUCATION -Group or individual verbal, written, or video instructions that support the educational goals of the pulmonary rehab program.   Holiday Eating Survival Tips:  -Group instruction provided by PowerPoint slides, verbal discussion, and written materials to support subject matter. The instructor gives patients tips, tricks, and techniques to help them not only survive but enjoy the holidays despite the onslaught of food that accompanies the holidays.   Knowledge Questionnaire Score: Knowledge Questionnaire Score - 10/23/18 1532      Knowledge Questionnaire Score   Pre Score   16/18       Core Components/Risk Factors/Patient Goals at Admission: Personal Goals and Risk Factors at Admission - 10/18/18 0937      Core Components/Risk Factors/Patient Goals on Admission  Improve shortness of breath with ADL's  Yes       Core Components/Risk Factors/Patient Goals Review:  Goals and Risk Factor Review    Row Name 10/18/18 2244 10/29/18 1239           Core Components/Risk Factors/Patient Goals Review   Personal Goals Review  Increase knowledge of respiratory medications and ability to use respiratory devices properly.;Improve shortness of breath with ADL's;Develop more efficient breathing techniques such as purse lipped breathing and diaphragmatic breathing and practicing self-pacing with activity.  Increase knowledge of respiratory medications and ability to use respiratory devices properly.;Improve shortness of breath with ADL's;Develop more efficient breathing techniques such as purse lipped breathing and diaphragmatic breathing and practicing self-pacing with activity.      Review  -  Just started program, has attended 2 exercise sessions, too early to meet goals for program.  Is requiring 10 L of oxygen to walk on the treadmill and 8 L on the nustep.  Is being evaluated for a liver transplant at [facility]      Expected Outcomes  -  See admission goals         Core Components/Risk Factors/Patient Goals at Discharge (Final Review):  Goals and Risk Factor Review - 10/29/18 1239      Core Components/Risk Factors/Patient Goals Review   Personal Goals Review  Increase knowledge of respiratory medications and ability to use respiratory devices properly.;Improve shortness of breath with ADL's;Develop more efficient breathing techniques such as purse lipped breathing and diaphragmatic breathing and practicing self-pacing with activity.    Review  Just started program, has attended 2 exercise sessions, too early to meet goals for program.  Is requiring 10 L of oxygen to walk  on the treadmill and 8 L on the nustep.  Is being evaluated for a liver transplant at [facility].    Expected Outcomes  See admission goals       ITP Comments:   Comments: ITP REVIEW Pt is making expected progress toward pulmonary rehab goals after completing 2 sessions. Recommend continued exercise, life style modification, education, and utilization of breathing techniques to increase stamina and strength and decrease shortness of breath with exertion.

## 2018-10-31 ENCOUNTER — Encounter (HOSPITAL_COMMUNITY): Payer: 59

## 2018-11-01 ENCOUNTER — Encounter (HOSPITAL_COMMUNITY)
Admission: RE | Admit: 2018-11-01 | Discharge: 2018-11-01 | Disposition: A | Discharge status: Home / Self Care | Payer: 59 | Source: Ambulatory Visit | Attending: Pulmonary Disease | Admitting: Pulmonary Disease

## 2018-11-01 ENCOUNTER — Other Ambulatory Visit: Payer: Self-pay

## 2018-11-01 ENCOUNTER — Encounter (HOSPITAL_COMMUNITY): Payer: 59

## 2018-11-01 DIAGNOSIS — J8410 Pulmonary fibrosis, unspecified: Secondary | ICD-10-CM | POA: Diagnosis not present

## 2018-11-01 NOTE — Progress Notes (Signed)
Daily Session Note  Patient Details  Name: William [name] MRN: [id number] Date of Birth: June 03, 1950 Referring Provider:     Pulmonary Rehab Walk Test from 10/18/2018 in [place]  Referring Provider  Dr. Anitra Lauth      Encounter Date: 11/01/2018  Check In: Session Check In - 11/01/18 1127      Check-In   Supervising physician immediately available to respond to emergencies  [facility] immediately available    Physician(s)  Dr. [place]    Location  [facility]    Staff Present  Rosebud Poles, RN, BSN;Lisa [name], RN;Carlette Wilber Oliphant, RN, BSN    Virtual Visit  No    Medication changes reported      No    Fall or balance concerns reported     No    Tobacco Cessation  No Change    Warm-up and Cool-down  Performed as group-led Higher education careers adviser Performed  Yes    VAD Patient?  No    PAD/SET Patient?  No      Pain Assessment   Currently in Pain?  No/denies    Multiple Pain Sites  No       Capillary Blood Glucose: No results found for this or any previous visit (from the past 24 hour(s)).    Social History   Tobacco Use  Smoking Status Former Smoker  . Types: Cigarettes  Smokeless Tobacco Never Used  Tobacco Comment   QUIT I 1983    Goals Met:  Proper associated with RPD/PD & O2 Sat Exercise tolerated well Strength training completed today  Goals Unmet:  Not Applicable  Comments: Service time is from 0945 to 16    Dr. Rush Farmer is Medical Director for Pulmonary Rehab at [facility].

## 2018-11-02 ENCOUNTER — Encounter (HOSPITAL_COMMUNITY): Payer: 59

## 2018-11-05 ENCOUNTER — Encounter (HOSPITAL_COMMUNITY): Payer: 59

## 2018-11-06 ENCOUNTER — Encounter (HOSPITAL_COMMUNITY)
Admission: RE | Admit: 2018-11-06 | Discharge: 2018-11-06 | Disposition: A | Discharge status: Home / Self Care | Payer: 59 | Source: Ambulatory Visit | Attending: Pulmonary Disease | Admitting: Pulmonary Disease

## 2018-11-06 ENCOUNTER — Encounter (HOSPITAL_COMMUNITY): Payer: 59

## 2018-11-06 ENCOUNTER — Ambulatory Visit: Payer: 59 | Admitting: Family Medicine

## 2018-11-06 ENCOUNTER — Other Ambulatory Visit: Payer: Self-pay

## 2018-11-06 DIAGNOSIS — J8410 Pulmonary fibrosis, unspecified: Secondary | ICD-10-CM

## 2018-11-06 NOTE — Progress Notes (Signed)
I have reviewed a Home Exercise Prescription with William [name] . William [name] is currently exercising at home.  The patient was advised to walk, ride the stationary bike, and use bands 2 days a week for 30-45 minutes.  William [name] and I discussed how to progress their exercise prescription.  The patient stated that their goals were to increase fitness and increase incline on the treadmill.  The patient stated that they understand the exercise prescription.  We reviewed exercise guidelines, target heart rate during exercise, RPE Scale, weather conditions, NTG use, endpoints for exercise, warmup and cool down.  Patient is encouraged to come to me with any questions. I will continue to follow up with the patient to assist them with progression and safety.    William [name], M.S., ACSM CEP

## 2018-11-06 NOTE — Progress Notes (Signed)
Daily Session Note  Patient Details  Name: William [name] MRN: [id number] Date of Birth: 02/07/1951 Referring Provider:     Pulmonary Rehab Walk Test from 10/18/2018 in [place]  Referring Provider  Dr. Anitra Lauth      Encounter Date: 11/06/2018  Check In: Session Check In - 11/06/18 1007      Check-In   Supervising physician immediately available to respond to emergencies  [facility] immediately available    Physician(s)  Dr. [place] Crane    Location  [facility]    Staff Present  Rosebud Poles, RN, Bjorn Loser, MS, Exercise Physiologist;Lisa [name], RN    Virtual Visit  No    Medication changes reported      No    Fall or balance concerns reported     No    Tobacco Cessation  No Change    Warm-up and Cool-down  Performed as group-led instruction    Resistance Training Performed  Yes    VAD Patient?  No    PAD/SET Patient?  No      Pain Assessment   Currently in Pain?  No/denies    Multiple Pain Sites  No       Capillary Blood Glucose: No results found for this or any previous visit (from the past 24 hour(s)).    Social History   Tobacco Use  Smoking Status Former Smoker  . Types: Cigarettes  Smokeless Tobacco Never Used  Tobacco Comment   QUIT I 1983    Goals Met:  Proper associated with RPD/PD & O2 Sat Exercise tolerated well Strength training completed today  Goals Unmet:  Not Applicable  Comments: Service time is from 0950 to [place]    Dr. Rush Farmer is Medical Director for Pulmonary Rehab at [facility].

## 2018-11-07 ENCOUNTER — Encounter (HOSPITAL_COMMUNITY): Payer: 59

## 2018-11-08 ENCOUNTER — Encounter (HOSPITAL_COMMUNITY): Payer: 59

## 2018-11-08 ENCOUNTER — Encounter (HOSPITAL_COMMUNITY)
Admission: RE | Admit: 2018-11-08 | Discharge: 2018-11-08 | Disposition: A | Discharge status: Home / Self Care | Payer: 59 | Source: Ambulatory Visit | Attending: Pulmonary Disease | Admitting: Pulmonary Disease

## 2018-11-08 ENCOUNTER — Other Ambulatory Visit: Payer: Self-pay

## 2018-11-08 DIAGNOSIS — J8410 Pulmonary fibrosis, unspecified: Secondary | ICD-10-CM | POA: Insufficient documentation

## 2018-11-08 HISTORY — PX: CT ABDOMEN WITH CONTRAST (ARMC HX): HXRAD1335

## 2018-11-08 NOTE — Progress Notes (Signed)
Daily Session Note  Patient Details  Name: William [name] MRN: [id number] Date of Birth: 1950/12/18 Referring Provider:     Pulmonary Rehab Walk Test from 10/18/2018 in [place]  Referring Provider  Dr. Anitra Lauth      Encounter Date: 11/08/2018  Check In: Session Check In - 11/08/18 1032      Check-In   Supervising physician immediately available to respond to emergencies  [facility] immediately available    Physician(s)  Dr. Benny Lennert    Location  [facility]    Staff Present  Rosebud Poles, RN, Bjorn Loser, MS, Exercise Physiologist;Lisa [name], RN    Virtual Visit  No    Medication changes reported      No    Fall or balance concerns reported     No    Tobacco Cessation  No Change    Warm-up and Cool-down  Performed as group-led instruction    Resistance Training Performed  Yes    VAD Patient?  No    PAD/SET Patient?  No      Pain Assessment   Currently in Pain?  No/denies    Multiple Pain Sites  No       Capillary Blood Glucose: No results found for this or any previous visit (from the past 24 hour(s)).    Social History   Tobacco Use  Smoking Status Former Smoker  . Types: Cigarettes  Smokeless Tobacco Never Used  Tobacco Comment   QUIT I 1983    Goals Met:  Proper associated with RPD/PD & O2 Sat Exercise tolerated well Strength training completed today  Goals Unmet:  Not Applicable  Comments: Service time is from 0945 to 1050    Dr. Rush Farmer is Medical Director for Pulmonary Rehab at [facility].

## 2018-11-09 ENCOUNTER — Encounter (HOSPITAL_COMMUNITY): Payer: 59

## 2018-11-09 ENCOUNTER — Ambulatory Visit: Payer: 59 | Admitting: Family Medicine

## 2018-11-09 ENCOUNTER — Other Ambulatory Visit: Payer: Self-pay | Admitting: *Deleted

## 2018-11-09 DIAGNOSIS — D500 Iron deficiency anemia secondary to blood loss (chronic): Secondary | ICD-10-CM

## 2018-11-12 ENCOUNTER — Encounter (HOSPITAL_COMMUNITY): Payer: 59

## 2018-11-12 ENCOUNTER — Inpatient Hospital Stay: Payer: 59 | Attending: Hematology

## 2018-11-12 ENCOUNTER — Other Ambulatory Visit: Payer: Self-pay

## 2018-11-12 ENCOUNTER — Inpatient Hospital Stay: Payer: 59

## 2018-11-12 VITALS — BP 108/56 | HR 55 | Temp 98.7°F | Resp 18

## 2018-11-12 DIAGNOSIS — D500 Iron deficiency anemia secondary to blood loss (chronic): Secondary | ICD-10-CM

## 2018-11-12 DIAGNOSIS — K922 Gastrointestinal hemorrhage, unspecified: Secondary | ICD-10-CM | POA: Diagnosis not present

## 2018-11-12 LAB — IRON AND TIBC
Iron: 47 ug/dL (ref 42–163)
Saturation Ratios: 17 % — ABNORMAL LOW (ref 20–55)
TIBC: 284 ug/dL (ref 202–409)
UIBC: 236 ug/dL (ref 117–376)

## 2018-11-12 LAB — CBC WITH DIFFERENTIAL (CANCER CENTER ONLY)
Abs Immature Granulocytes: 0.01 10*3/uL (ref 0.00–0.07)
Basophils Absolute: 0.1 10*3/uL (ref 0.0–0.1)
Basophils Relative: 2 %
Eosinophils Absolute: 0.8 10*3/uL — ABNORMAL HIGH (ref 0.0–0.5)
Eosinophils Relative: 17 %
HCT: 35.9 % — ABNORMAL LOW (ref 39.0–52.0)
Hemoglobin: 11.8 g/dL — ABNORMAL LOW (ref 13.0–17.0)
Immature Granulocytes: 0 %
Lymphocytes Relative: 16 %
Lymphs Abs: 0.8 10*3/uL (ref 0.7–4.0)
MCH: 32.1 pg (ref 26.0–34.0)
MCHC: 32.9 g/dL (ref 30.0–36.0)
MCV: 97.6 fL (ref 80.0–100.0)
Monocytes Absolute: 0.7 10*3/uL (ref 0.1–1.0)
Monocytes Relative: 15 %
Neutro Abs: 2.4 10*3/uL (ref 1.7–7.7)
Neutrophils Relative %: 50 %
Platelet Count: 68 10*3/uL — ABNORMAL LOW (ref 150–400)
RBC: 3.68 MIL/uL — ABNORMAL LOW (ref 4.22–5.81)
RDW: 15.3 % (ref 11.5–15.5)
WBC Count: 4.9 10*3/uL (ref 4.0–10.5)
nRBC: 0 % (ref 0.0–0.2)

## 2018-11-12 LAB — CMP (CANCER CENTER ONLY)
ALT: 26 U/L (ref 0–44)
AST: 43 U/L — ABNORMAL HIGH (ref 15–41)
Albumin: 2.7 g/dL — ABNORMAL LOW (ref 3.5–5.0)
Alkaline Phosphatase: 123 U/L (ref 38–126)
Anion gap: 9 (ref 5–15)
BUN: 14 mg/dL (ref 8–23)
CO2: 22 mmol/L (ref 22–32)
Calcium: 8.2 mg/dL — ABNORMAL LOW (ref 8.9–10.3)
Chloride: 106 mmol/L (ref 98–111)
Creatinine: 1.59 mg/dL — ABNORMAL HIGH (ref 0.61–1.24)
GFR, Est AFR Am: 51 mL/min — ABNORMAL LOW (ref >60)
GFR, Estimated: 44 mL/min — ABNORMAL LOW (ref >60)
Glucose, Bld: 134 mg/dL — ABNORMAL HIGH (ref 70–99)
Potassium: 4 mmol/L (ref 3.5–5.1)
Sodium: 137 mmol/L (ref 135–145)
Total Bilirubin: 5.5 mg/dL (ref 0.3–1.2)
Total Protein: 6.3 g/dL — ABNORMAL LOW (ref 6.5–8.1)

## 2018-11-12 LAB — FERRITIN: Ferritin: 155 ng/mL (ref 24–336)

## 2018-11-12 MED ORDER — SODIUM CHLORIDE 0.9 % IV SOLN
750.0000 mg | Freq: Once | INTRAVENOUS | Status: AC
  Administered 2018-11-12: 09:00:00 750 mg via INTRAVENOUS
  Filled 2018-11-12: qty 15

## 2018-11-12 MED ORDER — OCTREOTIDE ACETATE 30 MG IM KIT
PACK | INTRAMUSCULAR | Status: AC
  Filled 2018-11-12: qty 1

## 2018-11-12 MED ORDER — OCTREOTIDE ACETATE 30 MG IM KIT
30.0000 mg | PACK | Freq: Once | INTRAMUSCULAR | Status: AC
  Administered 2018-11-12: 30 mg via INTRAMUSCULAR

## 2018-11-12 NOTE — Patient Instructions (Signed)
Ferric carboxymaltose injection What is this medicine? FERRIC CARBOXYMALTOSE (ferr-ik car-box-ee-mol-toes) is an iron complex. Iron is used to make healthy red blood cells, which carry oxygen and nutrients throughout the body. This medicine is used to treat anemia in people with chronic kidney disease or people who cannot take iron by mouth. This medicine may be used for other purposes; ask your health care provider or pharmacist if you have questions. COMMON BRAND NAME(S): Injectafer What should I tell my health care provider before I take this medicine? They need to know if you have any of these conditions:  high levels of iron in the blood  liver disease  an unusual or allergic reaction to iron, other medicines, foods, dyes, or preservatives  pregnant or trying to get pregnant  breast-feeding How should I use this medicine? This medicine is for infusion into a vein. It is given by a health care professional in a hospital or clinic setting. Talk to your pediatrician regarding the use of this medicine in children. Special care may be needed. Overdosage: If you think you have taken too much of this medicine contact a poison control center or emergency room at once. NOTE: This medicine is only for you. Do not share this medicine with others. What if I miss a dose? It is important not to miss your dose. Call your doctor or health care professional if you are unable to keep an appointment. What may interact with this medicine? Do not take this medicine with any of the following medications:  deferoxamine  dimercaprol  other iron products This list may not describe all possible interactions. Give your health care provider a list of all the medicines, herbs, non-prescription drugs, or dietary supplements you use. Also tell them if you smoke, drink alcohol, or use illegal drugs. Some items may interact with your medicine. What should I watch for while using this medicine? Visit your  doctor or health care professional regularly. Tell your doctor if your symptoms do not start to get better or if they get worse. You may need blood work done while you are taking this medicine. You may need to follow a special diet. Talk to your doctor. Foods that contain iron include: whole grains/cereals, dried fruits, beans, or peas, leafy green vegetables, and organ meats (liver, kidney). What side effects may I notice from receiving this medicine? Side effects that you should report to your doctor or health care professional as soon as possible:  allergic reactions like skin rash, itching or hives, swelling of the face, lips, or tongue  dizziness  facial flushing Side effects that usually do not require medical attention (report to your doctor or health care professional if they continue or are bothersome):  changes in taste  constipation  headache  nausea, vomiting  pain, redness, or irritation at site where injected This list may not describe all possible side effects. Call your doctor for medical advice about side effects. You may report side effects to FDA at 1-800-FDA-1088. Where should I keep my medicine? This drug is given in a hospital or clinic and will not be stored at home. NOTE: This sheet is a summary. It may not cover all possible information. If you have questions about this medicine, talk to your doctor, pharmacist, or health care provider.  2020 Elsevier/Gold Standard (2016-03-10 09:40:29)  Octreotide injection solution What is this medicine? OCTREOTIDE (ok TREE oh tide) is used to reduce blood levels of growth hormone in patients with a condition called acromegaly. This medicine  also reduces flushing and watery diarrhea caused by certain types of cancer. This medicine may be used for other purposes; ask your health care provider or pharmacist if you have questions. COMMON BRAND NAME(S): Bynfezia, Sandostatin, Sandostatin LAR What should I tell my health care  provider before I take this medicine? They need to know if you have any of these conditions:  diabetes  gallbladder disease  kidney disease  liver disease  an unusual or allergic reaction to octreotide, other medicines, foods, dyes, or preservatives  pregnant or trying to get pregnant  breast-feeding How should I use this medicine? This medicine is for injection under the skin or into a vein (only in emergency situations). It is usually given by a health care professional in a hospital or clinic setting. If you get this medicine at home, you will be taught how to prepare and give this medicine. Allow the injection solution to come to room temperature before use. Do not warm it artificially. Use exactly as directed. Take your medicine at regular intervals. Do not take your medicine more often than directed. It is important that you put your used needles and syringes in a special sharps container. Do not put them in a trash can. If you do not have a sharps container, call your pharmacist or healthcare provider to get one. Talk to your pediatrician regarding the use of this medicine in children. Special care may be needed. Overdosage: If you think you have taken too much of this medicine contact a poison control center or emergency room at once. NOTE: This medicine is only for you. Do not share this medicine with others. What if I miss a dose? If you miss a dose, take it as soon as you can. If it is almost time for your next dose, take only that dose. Do not take double or extra doses. What may interact with this medicine? Do not take this medicine with any of the following medications:  cisapride  droperidol  general anesthetics  grepafloxacin  perphenazine  thioridazine This medicine may also interact with the following medications:  bromocriptine  cyclosporine  diuretics  medicines for blood pressure, heart disease, irregular heart beat  medicines for diabetes,  including insulin  quinidine This list may not describe all possible interactions. Give your health care provider a list of all the medicines, herbs, non-prescription drugs, or dietary supplements you use. Also tell them if you smoke, drink alcohol, or use illegal drugs. Some items may interact with your medicine. What should I watch for while using this medicine? Visit your doctor or health care professional for regular checks on your progress. To help reduce irritation at the injection site, use a different site for each injection and make sure the solution is at room temperature before use. This medicine may cause decreases in blood sugar. Signs of low blood sugar include chills, cool, pale skin or cold sweats, drowsiness, extreme hunger, fast heartbeat, headache, nausea, nervousness or anxiety, shakiness, trembling, unsteadiness, tiredness, or weakness. Contact your doctor or health care professional right away if you experience any of these symptoms. This medicine may increase blood sugar. Ask your healthcare provider if changes in diet or medicines are needed if you have diabetes. This medicine may cause a decrease in vitamin B12. You should make sure that you get enough vitamin B12 while you are taking this medicine. Discuss the foods you eat and the vitamins you take with your health care professional. What side effects may I notice from receiving  this medicine? Side effects that you should report to your doctor or health care professional as soon as possible:  allergic reactions like skin rash, itching or hives, swelling of the face, lips, or tongue  decreases in blood sugar  changes in heart rate  severe stomach pain   signs and symptoms of high blood sugar such as being more thirsty or hungry or having to urinate more than normal. You may also feel very tired or have blurry vision. Side effects that usually do not require medical attention (report to your doctor or health care  professional if they continue or are bothersome):  diarrhea or constipation  gas or stomach pain  nausea, vomiting  pain, redness, swelling and irritation at site where injected This list may not describe all possible side effects. Call your doctor for medical advice about side effects. You may report side effects to FDA at 1-800-FDA-1088. Where should I keep my medicine? Keep out of the reach of children. Store in a refrigerator between 2 and 8 degrees C (36 and 46 degrees F). Protect from light. Allow to come to room temperature naturally. Do not use artificial heat. If protected from light, the injection may be stored at room temperature between 20 and 30 degrees C (70 and 86 degrees F) for 14 days. After the initial use, throw away any unused portion of a multiple dose vial after 14 days. Throw away unused portions of the ampules after use. NOTE: This sheet is a summary. It may not cover all possible information. If you have questions about this medicine, talk to your doctor, pharmacist, or health care provider.  2020 Elsevier/Gold Standard (2017-11-02 08:07:09)

## 2018-11-13 ENCOUNTER — Encounter: Payer: Self-pay | Admitting: Family Medicine

## 2018-11-13 ENCOUNTER — Encounter (HOSPITAL_COMMUNITY)
Admission: RE | Admit: 2018-11-13 | Discharge: 2018-11-13 | Disposition: A | Discharge status: Home / Self Care | Payer: 59 | Source: Ambulatory Visit | Attending: Pulmonary Disease | Admitting: Pulmonary Disease

## 2018-11-13 ENCOUNTER — Encounter (HOSPITAL_COMMUNITY): Payer: 59

## 2018-11-13 DIAGNOSIS — J8410 Pulmonary fibrosis, unspecified: Secondary | ICD-10-CM

## 2018-11-13 NOTE — Progress Notes (Signed)
Daily Session Note  Patient Details  Name: William [name] MRN: [id number] Date of Birth: [birth year] Referring Provider:     Pulmonary Rehab Walk Test from 10/18/2018 in [place]  Referring Provider  Dr. Anitra Lauth      Encounter Date: 11/13/2018  Check In: Session Check In - 11/13/18 1104      Check-In   Supervising physician immediately available to respond to emergencies  [facility] immediately available    Physician(s)  Dr. Benny Lennert    Location  [facility]    Staff Present  Rosebud Poles, RN, BSN;Carlette Wilber Oliphant, RN, Bjorn Loser, MS, Exercise Physiologist    Virtual Visit  No    Medication changes reported      No    Fall or balance concerns reported     No    Tobacco Cessation  No Change    Warm-up and Cool-down  Performed as group-led instruction    Resistance Training Performed  Yes    VAD Patient?  No    PAD/SET Patient?  No      Pain Assessment   Currently in Pain?  No/denies    Multiple Pain Sites  No       Capillary Blood Glucose: No results found for this or any previous visit (from the past 24 hour(s)).  Exercise Prescription Changes - 11/13/18 1100      Response to Exercise   Blood Pressure (Admit)  104/60    Blood Pressure (Exercise)  144/62    Blood Pressure (Exit)  108/68    Heart Rate (Admit)  60 bpm    Heart Rate (Exercise)  91 bpm    Heart Rate (Exit)  67 bpm    Oxygen Saturation (Admit)  89 %    Oxygen Saturation (Exercise)  89 %    Oxygen Saturation (Exit)  92 %    Rating of Perceived Exertion (Exercise)  13    Perceived Dyspnea (Exercise)  3    Duration  Continue with 30 min of aerobic exercise without signs/symptoms of physical distress.      Progression   Progression  Continue to progress workloads to maintain intensity without signs/symptoms of physical distress.      Resistance Training   Training Prescription  Yes    Weight  blue bands    Reps  10-15    Time  10 Minutes       Oxygen   Oxygen  Continuous    Liters  10      Treadmill   MPH  2.3    Grade  2    Minutes  15      NuStep   Level  3    SPM  80    Minutes  15    METs  3       Social History   Tobacco Use  Smoking Status Former Smoker  . Types: Cigarettes  Smokeless Tobacco Never Used  Tobacco Comment   QUIT I 1983    Goals Met:  Proper associated with RPD/PD & O2 Sat Exercise tolerated well Strength training completed today  Goals Unmet:  Not Applicable  Comments: Service time is from 0945 to 1050    Dr. Rush Farmer is Medical Director for Pulmonary Rehab at [facility].

## 2018-11-14 ENCOUNTER — Encounter (HOSPITAL_COMMUNITY): Payer: 59

## 2018-11-14 ENCOUNTER — Encounter: Payer: Self-pay | Admitting: Family Medicine

## 2018-11-14 DIAGNOSIS — J849 Interstitial pulmonary disease, unspecified: Secondary | ICD-10-CM | POA: Diagnosis not present

## 2018-11-14 DIAGNOSIS — J8410 Pulmonary fibrosis, unspecified: Secondary | ICD-10-CM | POA: Diagnosis not present

## 2018-11-14 DIAGNOSIS — K766 Portal hypertension: Secondary | ICD-10-CM | POA: Diagnosis not present

## 2018-11-14 DIAGNOSIS — K8020 Calculus of gallbladder without cholecystitis without obstruction: Secondary | ICD-10-CM | POA: Diagnosis not present

## 2018-11-14 DIAGNOSIS — R918 Other nonspecific abnormal finding of lung field: Secondary | ICD-10-CM | POA: Diagnosis not present

## 2018-11-14 DIAGNOSIS — K7581 Nonalcoholic steatohepatitis (NASH): Secondary | ICD-10-CM | POA: Diagnosis not present

## 2018-11-14 DIAGNOSIS — K7460 Unspecified cirrhosis of liver: Secondary | ICD-10-CM | POA: Diagnosis not present

## 2018-11-15 ENCOUNTER — Encounter: Payer: Self-pay | Admitting: Family Medicine

## 2018-11-15 ENCOUNTER — Ambulatory Visit (INDEPENDENT_AMBULATORY_CARE_PROVIDER_SITE_OTHER): Payer: 59 | Admitting: Family Medicine

## 2018-11-15 ENCOUNTER — Other Ambulatory Visit: Payer: Self-pay

## 2018-11-15 ENCOUNTER — Encounter (HOSPITAL_COMMUNITY): Payer: 59

## 2018-11-15 ENCOUNTER — Encounter (HOSPITAL_COMMUNITY)
Admission: RE | Admit: 2018-11-15 | Discharge: 2018-11-15 | Disposition: A | Discharge status: Home / Self Care | Payer: 59 | Source: Ambulatory Visit | Attending: Pulmonary Disease | Admitting: Pulmonary Disease

## 2018-11-15 VITALS — BP 109/60 | HR 89 | Temp 97.7°F

## 2018-11-15 DIAGNOSIS — K7581 Nonalcoholic steatohepatitis (NASH): Secondary | ICD-10-CM | POA: Diagnosis not present

## 2018-11-15 DIAGNOSIS — R0902 Hypoxemia: Secondary | ICD-10-CM

## 2018-11-15 DIAGNOSIS — K7210 Chronic hepatic failure without coma: Secondary | ICD-10-CM

## 2018-11-15 DIAGNOSIS — J8410 Pulmonary fibrosis, unspecified: Secondary | ICD-10-CM

## 2018-11-15 DIAGNOSIS — I10 Essential (primary) hypertension: Secondary | ICD-10-CM | POA: Diagnosis not present

## 2018-11-15 DIAGNOSIS — D500 Iron deficiency anemia secondary to blood loss (chronic): Secondary | ICD-10-CM

## 2018-11-15 DIAGNOSIS — K7460 Unspecified cirrhosis of liver: Secondary | ICD-10-CM

## 2018-11-15 NOTE — Progress Notes (Signed)
Daily Session Note  Patient Details  Name: William [name] MRN: [id number] Date of Birth: 08-29-1950 Referring Provider:     Pulmonary Rehab Walk Test from 10/18/2018 in [place]  Referring Provider  Dr. Anitra Lauth      Encounter Date: 11/15/2018  Check In: Session Check In - 11/15/18 1049      Check-In   Supervising physician immediately available to respond to emergencies  [facility] immediately available    Physician(s)  Dr. Benny Lennert    Location  [facility]    Staff Present  Rosebud Poles, RN, BSN;Carlette Wilber Oliphant, RN, Bjorn Loser, MS, Exercise Physiologist    Virtual Visit  No    Medication changes reported      No    Fall or balance concerns reported     No    Tobacco Cessation  No Change    Warm-up and Cool-down  Performed as group-led instruction    Resistance Training Performed  Yes    VAD Patient?  No    PAD/SET Patient?  No      Pain Assessment   Currently in Pain?  No/denies    Multiple Pain Sites  No       Capillary Blood Glucose: No results found for this or any previous visit (from the past 24 hour(s)).    Social History   Tobacco Use  Smoking Status Former Smoker  . Types: Cigarettes  Smokeless Tobacco Never Used  Tobacco Comment   QUIT I 1983    Goals Met:  Proper associated with RPD/PD & O2 Sat Exercise tolerated well Strength training completed today  Goals Unmet:  Not Applicable  Comments: Service time is from 0945 to 36    Dr. Rush Farmer is Medical Director for Pulmonary Rehab at [facility].

## 2018-11-15 NOTE — Progress Notes (Signed)
Virtual Visit via Video Note  I connected with pt on 11/15/18 at  4:00 PM EDT by a video enabled telemedicine application and verified that I am speaking with the correct person using two identifiers.  Location patient: home Location provider:work or home office Persons participating in the virtual visit: patient, provider  I discussed the limitations of evaluation and management by telemedicine and the availability of in person appointments. The patient expressed understanding and agreed to proceed.  Telemedicine visit is a necessity given the COVID-19 restrictions in place at the current time.  HPI: 68 y/o WM with whom I am doing a telephone visit today (due to COVID-19 pandemic restrictions) forf/u HTN (both systemic and portal). 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  He has had problems the last 2 yrs with anemia secondary to GI blood loss from unidentified site, many blood transfusions have been required.  His bleeding/blood counts have been fairly stable the last 1/2 year or so on IV iron, lysteda, and monthly sandostatin by his hematologist, Dr. Irene Limbo.  Interim hx: Most recent iron infusion was 11/12/18.  Hb was 11.8 at that time.  His iron infusions are now scheduled at q57mointervals.  He has occ stool that is "a little dark" for a day or two and then goes away. 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  He feels like his work of breathing when walking is lessening.  No hypoxia at rest. Plans on doing pulm rehab for 9 wks. He will be getting a skin ca on forehead removed 01/14/19, then has next transplant MD f/u right after.  [facility]  [facility]  BP: 110/60s most of the time.  With rehab it will elevate to 140s70s. No orthostatic dizziness.  No  new complaints.  ROS: See pertinent positives and negatives per HPI.  Past Medical History:  Diagnosis Date  . AAA (abdominal aortic aneurysm) ([place] 03/2014   repaired 2020  . Anemia due to chronic blood loss 2018/19   GI: transfusions x >20 required; multiple endoscopies and bleeding scans unrevealing. Lysteda and octreotide + monthly iron infusions as of 04/2017. Iron infusions changed to every other month as of 09/2018 hem f/u.  .Marland KitchenBilateral renal cysts    simple (03/2014 MRI)  . CAD (coronary artery disease)   . Cancer ([place] 08/15/2018   BCC nose, excised  . Cholelithiases 2018   asymptomatic  . Chronic diastolic heart failure ([place]   . Cirrhosis ([place] 05/2016   secondary to NASH; most recent u/s abd 07/2016 showed splenomegaly.  Hx of portal HTN changes with mild ascites and splenomegaly.  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  .Marland KitchenCirrhosis ([place]    [facility]--->they agree he needs liver transplant but want to get some pulm colleagues to weigh in on his lung fxn/pulm scarring---PULM REHAB recommended  . Diabetes mellitus with complication ([place] [id number]  A1c 6.8%  . History of blood transfusion 2018 X 4 dates   "low blood" (09/21/2016)  . Hyperlipemia, mixed    elevated LFTs when on statins.    . Hypertension    Cr bump 04/01/16 so I changed benicar-hct to benicar plain and added  amlodipine 5 mg.  . Iron deficiency anemia 05/2016   Acute blood loss anemia: hospitalized, required transfusion x 3 U: colonoscopy and capsule study unrevealing.  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  W/u for ? hemolytic anemia to be pursued by hematologist as outpt.  Dr. [name], GI at [facility] following, too---he rec'd onc do bone marrow bx as of Jan 2019  . Liver failure ([place]) 2020   NASH cirrhosis-->pt undergoing long eval for placement on  liver transplant list at [facility].  . Lung field abnormal finding on examination    Bibasilar L>R mild insp crackles-->x-ray showed mild interstitial changes/fibrosis/scarring.  Changes noted on all CXRs in 2018/2019.  Liver Transplant eval 03/2018->mild restriction on PFTs but no obstruction.  . Microscopic hematuria    Eval unremarkable by Dr. Eulogio Ditch.  [name] NASH (nonalcoholic steatohepatitis)    Fatty liver on MR abd 03/2014, + hx of elevated transaminases and bili: followed by Dr. [name].  . Obesity   . Pulmonary fibrosis (HCC)    PFTs: restrictive lung dz ([facility] transplant eval 05/2018). [facility] pulm eval felt this was likely hypersensitivity pneumonitis->causing his hypoxia->low likelihood of progression (as of 10/04/18 transplant clinic f/u)  . Spleen enlarged     Past Surgical History:  Procedure Laterality Date  . ABDOMINAL AORTIC ANEURYSM REPAIR  08/23/2018   [facility]  . CARDIAC CATHETERIZATION    . CARDIOVASCULAR STRESS TEST  01/17/2012; 05/04/16   Normal stress nuclear study x 2 (2018--Normal perfusion. LVEF 66% with normal wall motion. This is a low risk study).  . CERVICAL DISCECTOMY  1992  . COLONOSCOPY N/A 05/27/2016   No site/explanation for blood loss found.  Erythematous mucosa in cecum and ascending colon--Cecal bx: normal.  Procedure: COLONOSCOPY;  Surgeon: Carol Ada, MD;  Location: [facility] ENDOSCOPY;  Service: Endoscopy;  Laterality: N/A;  . COLONOSCOPY W/ POLYPECTOMY  09/10/2014   Polypectomy x 2: recall 5 yrs (Dr. [name]).  . CORONARY ANGIOPLASTY    . CORONARY ARTERY BYPASS GRAFT  03/2006  . ENTEROSCOPY N/A 07/31/2016   Procedure: ENTEROSCOPY;  Surgeon: Ladene Artist, MD;  Location[facility] ENDOSCOPY;  Service: Endoscopy;  Laterality: N/A;  . ENTEROSCOPY N/A 12/02/2016   Procedure: ENTEROSCOPY;  Surgeon: Carol Ada, MD;  Location: [facility] ENDOSCOPY;  Service: Endoscopy;  Laterality: N/A;  . ESOPHAGOGASTRODUODENOSCOPY N/A 05/25/2016   No site/explanation for blood loss found.   Procedure: ESOPHAGOGASTRODUODENOSCOPY (EGD);  Surgeon: Juanita Craver, MD;  Location[facility] ENDOSCOPY;  Service: Endoscopy;  Laterality: N/A;  . ESOPHAGOGASTRODUODENOSCOPY N/A 09/23/2016   Procedure: ESOPHAGOGASTRODUODENOSCOPY (EGD);  Surgeon: Carol Ada, MD;  Location: [place];  Service: Endoscopy;  Laterality: N/A;  . ESOPHAGOGASTRODUODENOSCOPY (EGD) WITH PROPOFOL N/A 08/21/2018   Procedure: ESOPHAGOGASTRODUODENOSCOPY (EGD) WITH PROPOFOL;  Surgeon: Carol Ada, MD;  Location: [facility] ENDOSCOPY;  Service: Endoscopy;  Laterality: N/A;  . GIVENS CAPSULE STUDY N/A 05/27/2016   No source identified.  Repeat 10/2016--results pending.  Procedure: GIVENS CAPSULE STUDY;  Surgeon: Carol Ada, MD;  Location: [facility] ENDOSCOPY;  Service: Endoscopy;  Laterality: N/A;  . GIVENS CAPSULE STUDY N/A 10/15/2016   Procedure: GIVENS CAPSULE STUDY;  Surgeon: Carol Ada, MD;  Location: [facility] ENDOSCOPY;  Service: Endoscopy;  Laterality: N/A;  . GIVENS CAPSULE STUDY N/A 02/13/2017   Procedure: GIVENS CAPSULE STUDY;  Surgeon: Carol Ada, MD;  Location: [place];  Service: Endoscopy;  Laterality: N/A;  . NECK SURGERY  1991  . NM GI BLOOD LOSS  01/27/2017   NEGATIVE  . SMALL BOWEL ENTEROSCOPY  10/2016   ([facility], Dr. [name]: Double balloon enteroscopy--to the level of proximal ileum) jejunal polyps- which were removed--not bleeding & benign.  No source of bleeding was identified.  . TRANSTHORACIC ECHOCARDIOGRAM  05/25/2016; 10/2017   05/2016: EF 60%, normal wall motion, grd II DD, mild aortic stenosis, dilated aortic root and ascending aorta. 10/2017: Mild LVH; no mention made of LV function.  Moderate aorticstenosis with mean gradient 19 peak gradient 41. AVA 1.1 cm^2.  05/2018 EF 55%,4.3 cm ascend aneur, mild AS.  [name] VASECTOMY  1977    Family History  Problem Relation Age of Onset  . Hypertension Mother   . Cancer - Other Mother        liver cancer  . Heart disease Father   . Heart attack Father   . Cancer - Lung Father   .  Diabetes Father   . Liver disease Sister   . Anemia Sister   . Heart disease Sister   . Heart attack Sister   . Heart disease Brother        CABG 20 YEARS AGO  . Hypertension Brother   . Mesothelioma Brother        HALF-BROTHER  . Cancer - Other Brother        CLL  . Cancer - Lung Brother        Mets to brain  . Cancer - Lung Sister        HALF-SISTER  . Liver disease Brother   . Heart disease Brother        CABG-2012  . Emphysema Maternal Grandfather   . Cancer - Other Paternal Grandmother        Stomach  . Heart attack Paternal Grandfather     SOCIAL HX:  Social History   Socioeconomic History  . Marital status: Married    Spouse name: Not on file  . Number of children: Not on file  . Years of education: Not on file  . Highest education level: Not on file  Occupational History  . Not on file  Social Needs  . Financial resource strain: Not on file  . Food insecurity    Worry: Not on file    Inability: Not on file  . Transportation needs    Medical: Not on file    Non-medical: Not on file  Tobacco Use  . Smoking status: Former Smoker    Types: Cigarettes  . Smokeless tobacco: Never Used  . Tobacco comment: QUIT I 1983  Substance and Sexual Activity  . Alcohol use: No  . Drug use: No  . Sexual activity: Yes  Lifestyle  . Physical activity    Days per week: 0 days    Minutes per session: 0 min  . Stress: Not on file  Relationships  . Social Herbalist on phone: Not on file    Gets together: Not on file    Attends religious service: Not on file    Active member of club or organization: Not on file    Attends meetings of clubs or organizations: Not on file    Relationship status: Not on file  Other Topics Concern  . Not on file  Social History Narrative   Married, 3 grown children, 3 GCs.   Educ: 10th grade.   Occupation: Retired Brewing technologist.   Tob: quit 1983, smoked about 20 pack-yr hx prior.   No alcohol.    Current Outpatient  Medications:  .  iron sucrose (VENOFER) 20 MG/ML injection, Inject 750 mg into the vein every 30 (thirty) days. , Disp: , Rfl:  .  lactulose (CHRONULAC) 10 GM/15ML solution, Take 13.3333 g by mouth 2 (two) times a day., Disp: , Rfl:  .  metoprolol tartrate (LOPRESSOR) 25 MG tablet, Take 0.5 tablets (12.5 mg total) by mouth 2 (two) times daily., Disp: 90 tablet, Rfl: 1 .  OCTREOTIDE ACETATE IJ, Inject 30 mcg into the vein every 30 (thirty) days. , Disp: , Rfl:  .  pantoprazole (PROTONIX) 40 MG tablet, TAKE 1 TABLET BY MOUTH TWICE DAILY BEFORE A MEAL (Patient taking differently: Take 40 mg by mouth 2 (two) times daily before a meal. TAKE 1 TABLET BY MOUTH TWICE DAILY BEFORE A MEAL), Disp: 180 tablet, Rfl: 1 .  potassium chloride SA (K-DUR) 20 MEQ tablet, TAKE 1 TABLET (20 MEQ TOTAL) BY MOUTH TWICE A DAY (Patient taking differently: Take 20 mEq by mouth 2 (two) times daily. TAKE 1 TABLET (20 MEQ TOTAL) BY MOUTH TWICE A DAY), Disp: 180 tablet, Rfl: 1 .  spironolactone (ALDACTONE) 25 MG tablet, Take 0.5 tablets (12.5 mg total) by mouth daily., Disp: 45 tablet, Rfl: 1 .  torsemide (DEMADEX) 20 MG tablet, Take 2 tablets (40 mg total) by mouth daily., Disp: 180 tablet, Rfl: 1 .  tranexamic acid (LYSTEDA) 650 MG TABS tablet, TAKE 2 TABLETS BY MOUTH 3 TIMES DAILY. (Patient taking differently: Take 650 mg by mouth 2 (two) times a day. [name]), Disp: 180 tablet, Rfl: 1 No current facility-administered medications for this visit.   Facility-Administered Medications Ordered in Other Visits:  .  heparin lock flush 100 unit/mL, 500 Units, Intracatheter, Daily PRN, Truitt Merle, MD .  sodium chloride flush (NS) 0.9 % injection 10 mL, 10 mL, Intracatheter, PRN, Truitt Merle, MD  EXAMTonette Bihari per patient if applicable: BP 295/62   Pulse 89   Temp 97.7 F (36.5 C) (Oral)    GENERAL: alert, oriented, appears well and in no acute distress Skin of face ? Mild jaundice.  HEENT: atraumatic, conjunttiva with perhaps a hint  of icterus, no obvious abnormalities on inspection of external nose and ears  NECK: normal movements of the head and neck  LUNGS: on inspection no signs of respiratory distress, breathing rate appears normal, no obvious gross SOB, gasping or wheezing  CV: no obvious cyanosis  MS: moves all visible extremities without noticeable abnormality  PSYCH/NEURO: pleasant and cooperative, no obvious depression or anxiety, speech and thought processing grossly intact  LABS: none today  Lab Results  Component Value Date   TSH 1.19 05/04/2016   Lab Results  Component Value Date   WBC 4.9 11/12/2018   HGB 11.8 (L) 11/12/2018   HCT 35.9 (L) 11/12/2018   MCV 97.6 11/12/2018   PLT 68 (L) 11/12/2018   Lab Results  Component Value Date   CREATININE 1.59 (H) 11/12/2018   BUN 14 11/12/2018   NA 137 11/12/2018   K 4.0 11/12/2018   CL 106 11/12/2018   CO2 22 11/12/2018   Lab Results  Component Value Date   ALT 26 11/12/2018   AST 43 (H) 11/12/2018   ALKPHOS 123 11/12/2018   BILITOT 5.5 (HH) 11/12/2018   Lab Results  Component Value Date   CHOL 162 01/25/2017   Lab Results  Component Value Date   HDL 47 01/25/2017   Lab Results  Component Value Date   LDLCALC 100 (H) 01/25/2017   Lab Results  Component Value Date   TRIG 76 01/25/2017   Lab Results  Component Value Date   CHOLHDL 3.4 01/25/2017   Lab Results  Component Value Date   PSA 0.11 05/15/2018   PSA 0.31 06/29/2016   Lab Results  Component Value Date   HGBA1C 4.7 05/15/2018   ASSESSMENT AND PLAN:  Discussed the following assessment and plan:  1) HTN: The current medical regimen is effective;  continue present plan and medications. Lytes/renal function stable on labs 3d/a.  2) Compensated liver failure secondary to non-alcoholic cirrhosis. Stable.  Continue all current meds and f/u with local GI team as well as liver transplant team at [facility]. He is still not able to be put on the transplant list, but has  not been told "NO" at this time.  3) Iron def anemia secondary to chronic GI blood loss of unknown location: Extensive w/u has been done, then repeated. At this point he still has small amounts of melanotic stool and the octreotide and tranexamic acid are keeping any bleeding to such a minimum that his Hb is near normal and his iron infusions are able to be spaced out to q 2 months.  Continue f/u with Dr. Irene Limbo.  4) Pulm fibrosis with hypoxia upon ambulation: from my best interpretation of [facility] pulm notes, they feel like he has hypersensitivity pneumonitis that has low chance of progression and he is improving with pulm rehab. Repeat CT was done yesterday at [place], no results known yet.  I discussed the assessment and treatment plan with the patient. The patient was provided an opportunity to ask questions and all were answered. The patient agreed with the plan and demonstrated an understanding of the instructions.   The patient was advised to call back or seek an in-person evaluation if the symptoms worsen or if the condition fails to improve as anticipated.  F/u: 3 mo telemedicine [facility]  Signed:  Crissie Sickles, MD           11/15/2018

## 2018-11-16 DIAGNOSIS — K7469 Other cirrhosis of liver: Secondary | ICD-10-CM | POA: Diagnosis not present

## 2018-11-16 DIAGNOSIS — Z7901 Long term (current) use of anticoagulants: Secondary | ICD-10-CM | POA: Diagnosis not present

## 2018-11-16 MED FILL — METOPROLOL TARTRATE 25 MG T: 25 | 90 days supply | Qty: 90 | Fill #1

## 2018-11-20 ENCOUNTER — Encounter (HOSPITAL_COMMUNITY)
Admission: RE | Admit: 2018-11-20 | Discharge: 2018-11-20 | Disposition: A | Discharge status: Home / Self Care | Payer: 59 | Source: Ambulatory Visit | Attending: Pulmonary Disease | Admitting: Pulmonary Disease

## 2018-11-20 ENCOUNTER — Other Ambulatory Visit: Payer: Self-pay

## 2018-11-20 DIAGNOSIS — J8410 Pulmonary fibrosis, unspecified: Secondary | ICD-10-CM

## 2018-11-20 NOTE — Progress Notes (Signed)
Daily Session Note  Patient Details  Name: William [name] MRN: [id number] Date of Birth: Sep 14, 1950 Referring Provider:     Pulmonary Rehab Walk Test from 10/18/2018 in [place]  Referring Provider  Dr. Anitra Lauth      Encounter Date: 11/20/2018  Check In: Session Check In - 11/20/18 1002      Check-In   Supervising physician immediately available to respond to emergencies  [facility] immediately available    Physician(s)  Dr. Doristine Bosworth    Location  [facility]    Staff Present  Rosebud Poles, RN, Bjorn Loser, MS, Exercise Physiologist;Lisa [name], RN    Virtual Visit  No    Medication changes reported      No    Fall or balance concerns reported     No    Tobacco Cessation  No Change    Warm-up and Cool-down  Performed as group-led instruction    Resistance Training Performed  Yes    VAD Patient?  No    PAD/SET Patient?  No      Pain Assessment   Currently in Pain?  No/denies    Multiple Pain Sites  No       Capillary Blood Glucose: No results found for this or any previous visit (from the past 24 hour(s)).    Social History   Tobacco Use  Smoking Status Former Smoker  . Types: Cigarettes  Smokeless Tobacco Never Used  Tobacco Comment   QUIT I 1983    Goals Met:  Proper associated with RPD/PD & O2 Sat Exercise tolerated well Strength training completed today  Goals Unmet:  Not Applicable  Comments: Service time is from 0945 to Allison    Dr. Rush Farmer is Medical Director for Pulmonary Rehab at [facility].

## 2018-11-22 ENCOUNTER — Other Ambulatory Visit: Payer: Self-pay

## 2018-11-22 ENCOUNTER — Encounter (HOSPITAL_COMMUNITY)
Admission: RE | Admit: 2018-11-22 | Discharge: 2018-11-22 | Disposition: A | Discharge status: Home / Self Care | Payer: 59 | Source: Ambulatory Visit | Attending: Pulmonary Disease | Admitting: Pulmonary Disease

## 2018-11-22 VITALS — Wt 214.1 lb

## 2018-11-22 DIAGNOSIS — J8410 Pulmonary fibrosis, unspecified: Secondary | ICD-10-CM | POA: Diagnosis not present

## 2018-11-22 NOTE — Progress Notes (Signed)
Daily Session Note  Patient Details  Name: William [name] MRN: [id number] Date of Birth: 10/17/1950 Referring Provider:     Pulmonary Rehab Walk Test from 10/18/2018 in [place]  Referring Provider  Dr. Anitra Lauth      Encounter Date: 11/22/2018  Check In: Session Check In - 11/22/18 1120      Check-In   Supervising physician immediately available to respond to emergencies  [facility] immediately available    Physician(s)  Dr. Doristine Bosworth    Location  [facility]    Staff Present  Hoy Register, MS, Exercise Physiologist;Lisa Colletta [place], RN, MHA    Virtual Visit  No    Medication changes reported      No    Fall or balance concerns reported     No    Tobacco Cessation  No Change    Warm-up and Cool-down  Performed on first and last piece of equipment    Resistance Training Performed  Yes    VAD Patient?  No    PAD/SET Patient?  No      Pain Assessment   Currently in Pain?  No/denies    Pain Score  0-No pain    Multiple Pain Sites  No       Capillary Blood Glucose: No results found for this or any previous visit (from the past 24 hour(s)).    Social History   Tobacco Use  Smoking Status Former Smoker  . Types: Cigarettes  Smokeless Tobacco Never Used  Tobacco Comment   QUIT I 1983    Goals Met:  Exercise tolerated well  Goals Unmet:  Not Applicable  Comments: Service time is from 0945 to 1040    Dr. Rush Farmer is Medical Director for Pulmonary Rehab at [facility].

## 2018-11-23 ENCOUNTER — Other Ambulatory Visit: Payer: Self-pay | Admitting: Hematology

## 2018-11-26 ENCOUNTER — Other Ambulatory Visit: Payer: Self-pay

## 2018-11-26 DIAGNOSIS — Z20822 Contact with and (suspected) exposure to covid-19: Secondary | ICD-10-CM

## 2018-11-26 MED FILL — TRANEXAMIC ACID 650 MG TAB: 650 | 30 days supply | Qty: 60 | Fill #0

## 2018-11-27 ENCOUNTER — Telehealth (HOSPITAL_COMMUNITY): Payer: Self-pay

## 2018-11-27 ENCOUNTER — Encounter (HOSPITAL_COMMUNITY): Payer: 59

## 2018-11-28 LAB — NOVEL CORONAVIRUS, NAA: SARS-CoV-2, NAA: NOT DETECTED

## 2018-11-28 NOTE — Progress Notes (Signed)
Pulmonary Individual Treatment Plan  Patient Details  Name: William [name] MRN: [id number] Date of Birth: [birth year] Referring Provider:     Pulmonary Rehab Walk Test from 10/18/2018 in [place]  Referring Provider  Dr. Anitra Lauth      Initial Encounter Date:    Pulmonary Rehab Walk Test from 10/18/2018 in [place]  Date  10/18/18      Visit Diagnosis: Pulmonary fibrosis ([place])  Patient's Home Medications on Admission:   Current Outpatient Medications:  .  iron sucrose (VENOFER) 20 MG/ML injection, Inject 750 mg into the vein. Every 60 days, Disp: , Rfl:  .  lactulose (CHRONULAC) 10 GM/15ML solution, Take 13.3333 g by mouth 2 (two) times a day., Disp: , Rfl:  .  metoprolol tartrate (LOPRESSOR) 25 MG tablet, Take 0.5 tablets (12.5 mg total) by mouth 2 (two) times daily., Disp: 90 tablet, Rfl: 1 .  OCTREOTIDE ACETATE IJ, Inject 30 mcg into the vein every 30 (thirty) days. , Disp: , Rfl:  .  pantoprazole (PROTONIX) 40 MG tablet, TAKE 1 TABLET BY MOUTH TWICE DAILY BEFORE A MEAL (Patient taking differently: Take 40 mg by mouth 2 (two) times daily before a meal. TAKE 1 TABLET BY MOUTH TWICE DAILY BEFORE A MEAL), Disp: 180 tablet, Rfl: 1 .  potassium chloride SA (K-DUR) 20 MEQ tablet, TAKE 1 TABLET (20 MEQ TOTAL) BY MOUTH TWICE A DAY (Patient taking differently: Take 20 mEq by mouth 2 (two) times daily. TAKE 1 TABLET (20 MEQ TOTAL) BY MOUTH TWICE A DAY), Disp: 180 tablet, Rfl: 1 .  spironolactone (ALDACTONE) 25 MG tablet, Take 0.5 tablets (12.5 mg total) by mouth daily., Disp: 45 tablet, Rfl: 1 .  torsemide (DEMADEX) 20 MG tablet, Take 2 tablets (40 mg total) by mouth daily., Disp: 180 tablet, Rfl: 1 .  tranexamic acid (LYSTEDA) 650 MG TABS tablet, Take 1 tablet (650 mg total) by mouth 2 (two) times daily before a meal., Disp: 60 tablet, Rfl: 2 No current facility-administered medications for this encounter.    Facility-Administered Medications Ordered in Other Encounters:  .  heparin lock flush 100 unit/mL, 500 Units, Intracatheter, Daily PRN, Truitt Merle, MD .  sodium chloride flush (NS) 0.9 % injection 10 mL, 10 mL, Intracatheter, PRN, Truitt Merle, MD  Past Medical History: Past Medical History:  Diagnosis Date  . AAA (abdominal aortic aneurysm) ([place]) 03/2014   repaired 2020  . Bilateral renal cysts    simple (03/2014 MRI)  . CAD (coronary artery disease)   . Cholelithiases 2018   asymptomatic  . Chronic diastolic heart failure ([place])   . 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  Hx of portal HTN changes with mild ascites and splenomegaly.  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 Diabetes mellitus with complication ([place]) 81/1914   A1c 6.8%  . History of blood transfusion 2018 X 4 dates   "low blood" (09/21/2016)  . Hyperlipemia, mixed    elevated LFTs when on statins.    . Hypertension    Cr bump 04/01/16 so I changed benicar-hct to benicar plain and added amlodipine 5 mg.  . Iron deficiency anemia 05/2016   Acute blood loss anemia: hospitalized, required transfusion x 3 U: colonoscopy and capsule study unrevealing.  Readmitted 6/22-6/25, 2018 for symptomatic anemia again, got transfused x 4U, EGD with grd I esoph varices  and port hyt gastropathy.  W/u for ? hemolytic anemia to be pursued by hematologist as outpt.  Dr. [name], GI at [facility] following, too---he rec'd onc do bone marrow bx as of Jan 2019  . Iron deficiency anemia due to chronic blood loss 2018/19   GI: transfusions x >20 required; multiple endoscopies and bleeding scans unrevealing. Lysteda and octreotide + monthly iron infusions as of 04/2017. Iron infusions changed to every other month as of 09/2018 hem f/u.  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  . Lung field abnormal finding on examination    Bibasilar L>R mild insp crackles-->x-ray showed mild interstitial changes/fibrosis/scarring.  Changes noted on all CXRs in 2018/2019.  Liver Transplant eval 03/2018->mild restriction on PFTs but no obstruction.  See further details in [place] "pulm fibrosis" section  . Microscopic hematuria    Eval unremarkable by Dr. Eulogio Ditch.  . 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 Obesity   . Pulmonary fibrosis ([facility])    PFTs: restrictive lung dz ([facility] transplant eval 05/2018). 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 Pulm rehab helping as of 11/2018.  [name] Spleen enlarged     Tobacco Use: Social History   Tobacco Use  Smoking Status Former Smoker  . Types: Cigarettes  Smokeless Tobacco Never Used  Tobacco Comment   QUIT I 1983    Labs: Recent Review Flowsheet Data    Labs for ITP Cardiac and Pulmonary Rehab Latest Ref Rng & Units 01/04/2017 01/25/2017 04/05/2017 01/26/2018 05/15/2018   Cholestrol 100 - 199 mg/dL - 162 - - -   LDLCALC 0 - 99 mg/dL - 100(H) - - -   HDL >39 mg/dL - 47 - - -   Trlycerides 0 - 149 mg/dL - 76 - - -   Hemoglobin A1c - 5.5 - 4.6 4.7 4.7      Capillary Blood Glucose: Lab Results  Component Value Date   GLUCAP 154 (H) 05/11/2017   GLUCAP 109 (H) 12/01/2016   GLUCAP 127 (H) 12/01/2016   GLUCAP 122 (H) 09/23/2016   GLUCAP 162 (H) 09/22/2016     Pulmonary Assessment Scores: Pulmonary Assessment Scores    Row Name 10/18/18 1210 10/23/18 1532 10/25/18 1521     ADL UCSD   ADL Phase  Entry  -  Entry   SOB Score total  -  56  -     CAT Score   CAT Score  -  -  11     mMRC Score   mMRC Score  1  -  -     UCSD: Self-administered rating of dyspnea associated with activities of daily living (ADLs) 6-point scale (0 = "not at all" to 5 = "maximal or unable to do  because of breathlessness")  Scoring Scores range from 0 to 120.  Minimally important difference is 5 units  CAT: CAT can identify the health impairment of COPD patients and is better correlated with disease progression.  CAT has a scoring range of zero to 40. The CAT score is classified into four groups of low (less than 10), medium (10 - 20), high (21-30) and very high (31-40) based on the impact level of disease on health status. A CAT score over 10 suggests significant symptoms.  A worsening CAT score could be explained by an exacerbation,  poor medication adherence, poor inhaler technique, or progression of COPD or comorbid conditions.  CAT MCID is 2 points  mMRC: mMRC (Modified Medical Research Council) Dyspnea Scale is used to assess the degree of baseline functional disability in patients of respiratory disease due to dyspnea. No minimal important difference is established. A decrease in score of 1 point or greater is considered a positive change.   Pulmonary Function Assessment: Pulmonary Function Assessment - 10/18/18 0930      Breath   Bilateral Breath Sounds  Clear    Shortness of Breath  Yes;Limiting activity       Exercise Target Goals: Exercise Program Goal: Individual exercise prescription set using results from initial 6 min walk test and THRR while considering  patient's activity barriers and safety.   Exercise Prescription Goal: Initial exercise prescription builds to 30-45 minutes a day of aerobic activity, 2-3 days per week.  Home exercise guidelines will be given to patient during program as part of exercise prescription that the participant will acknowledge.  Activity Barriers & Risk Stratification: Activity Barriers & Cardiac Risk Stratification - 10/18/18 0926      Activity Barriers & Cardiac Risk Stratification   Activity Barriers  Deconditioning;Muscular Weakness;Shortness of Breath       6 Minute Walk: 6 Minute Walk    Row Name 10/18/18 1211          6 Minute Walk   Phase  Initial     Distance  456 feet     Walk Time  2.5 minutes     # of Rest Breaks  1 3.5 minutes due to desaturation. It took a long time for his saturations to rise back up. Still could not maintain saturations on 3L.     MPH  0.86     METS  1.77     RPE  12     Perceived Dyspnea   3     VO2 Peak  4.1     Symptoms  No     Resting HR  64 bpm     Resting BP  106/68     Resting Oxygen Saturation   90 %     Exercise Oxygen Saturation  during 6 min walk  81 %     Max Ex. HR  85 bpm     Max Ex. BP  128/72     2 Minute Post BP  112/66       Interval HR   1 Minute HR  85     2 Minute HR  85     3 Minute HR  74     4 Minute HR  71     5 Minute HR  70     6 Minute HR  76     2 Minute Post HR  64     Interval Heart Rate?  Yes       Interval Oxygen   Interval Oxygen?  Yes     Baseline Oxygen Saturation %  90 %     1 Minute Oxygen Saturation %  85 %     1 Minute Liters of Oxygen  2 L     2 Minute Oxygen Saturation %  81 %     2 Minute Liters of Oxygen  2 L     3 Minute Oxygen Saturation %  83 %     3 Minute Liters of Oxygen  3 L     4 Minute Oxygen Saturation %  86 %     4 Minute Liters of Oxygen  3 L     5 Minute Oxygen Saturation %  88 %     5 Minute Liters of Oxygen  3 L     6 Minute Oxygen Saturation %  86 %     6 Minute Liters of Oxygen  3 L     2 Minute Post Oxygen Saturation %  91 %     2 Minute Post Liters of Oxygen  3 L        Oxygen Initial Assessment: Oxygen Initial Assessment - 10/18/18 1209      Home Oxygen   Home Oxygen Device  Home Concentrator;E-Tanks    Sleep Oxygen Prescription  None    Home Exercise Oxygen Prescription  Continuous    Liters per minute  -1    Home at Rest Exercise Oxygen Prescription  None    Compliance with Home Oxygen Use  Yes      Initial 6 min Walk   Oxygen Used  Continuous    Liters per minute  3      Program Oxygen Prescription   Program Oxygen Prescription  Continuous    Liters per minute  4     Comments  Unable to maintain sats on 3L during walk test, will use 4L for exercise.      Intervention   Short Term Goals  To learn and exhibit compliance with exercise, home and travel O2 prescription;To learn and understand importance of monitoring SPO2 with pulse oximeter and demonstrate accurate use of the pulse oximeter.;To learn and understand importance of maintaining oxygen saturations>88%;To learn and demonstrate proper pursed lip breathing techniques or other breathing techniques.;To learn and demonstrate proper use of respiratory medications    Long  Term Goals  Exhibits compliance with exercise, home and travel O2 prescription;Verbalizes importance of monitoring SPO2 with pulse oximeter and return demonstration;Maintenance of O2 saturations>88%;Exhibits proper breathing techniques, such as pursed lip breathing or other method taught during program session;Compliance with respiratory medication       Oxygen Re-Evaluation: Oxygen Re-Evaluation    Row Name 10/29/18 0748 11/27/18 0722           Program Oxygen Prescription   Program Oxygen Prescription  Continuous  Continuous      Liters per minute  10  10      Comments  10 L for standing exercise and 8 L for seated exercise  10 L for standing exercise and 8 L for seated exercise        Home Oxygen   Home Oxygen Device  Home Concentrator;E-Tanks  Home Concentrator;E-Tanks      Sleep Oxygen Prescription  None  None      Home Exercise Oxygen Prescription  Continuous  Continuous      Liters per minute  10  10      Home at Rest Exercise Oxygen Prescription  None  None      Compliance with Home Oxygen Use  Yes  Yes        Goals/Expected Outcomes   Short Term Goals  To learn and exhibit compliance with exercise, home and travel O2 prescription;To learn and understand importance of monitoring SPO2 with pulse oximeter and demonstrate accurate use of the pulse oximeter.;To learn and understand importance of maintaining oxygen  saturations>88%;To learn and demonstrate proper pursed lip breathing techniques or other breathing techniques.;To learn and demonstrate proper use of respiratory medications  To learn and exhibit compliance with exercise,  home and travel O2 prescription;To learn and understand importance of monitoring SPO2 with pulse oximeter and demonstrate accurate use of the pulse oximeter.;To learn and understand importance of maintaining oxygen saturations>88%;To learn and demonstrate proper pursed lip breathing techniques or other breathing techniques.;To learn and demonstrate proper use of respiratory medications      Long  Term Goals  Exhibits compliance with exercise, home and travel O2 prescription;Verbalizes importance of monitoring SPO2 with pulse oximeter and return demonstration;Maintenance of O2 saturations>88%;Exhibits proper breathing techniques, such as pursed lip breathing or other method taught during program session;Compliance with respiratory medication  Exhibits compliance with exercise, home and travel O2 prescription;Verbalizes importance of monitoring SPO2 with pulse oximeter and return demonstration;Maintenance of O2 saturations>88%;Exhibits proper breathing techniques, such as pursed lip breathing or other method taught during program session;Compliance with respiratory medication      Goals/Expected Outcomes  compliance  compliance         Oxygen Discharge (Final Oxygen Re-Evaluation): Oxygen Re-Evaluation - 11/27/18 0722      Program Oxygen Prescription   Program Oxygen Prescription  Continuous    Liters per minute  10    Comments  10 L for standing exercise and 8 L for seated exercise      Home Oxygen   Home Oxygen Device  Home Concentrator;E-Tanks    Sleep Oxygen Prescription  None    Home Exercise Oxygen Prescription  Continuous    Liters per minute  10    Home at Rest Exercise Oxygen Prescription  None    Compliance with Home Oxygen Use  Yes      Goals/Expected Outcomes    Short Term Goals  To learn and exhibit compliance with exercise, home and travel O2 prescription;To learn and understand importance of monitoring SPO2 with pulse oximeter and demonstrate accurate use of the pulse oximeter.;To learn and understand importance of maintaining oxygen saturations>88%;To learn and demonstrate proper pursed lip breathing techniques or other breathing techniques.;To learn and demonstrate proper use of respiratory medications    Long  Term Goals  Exhibits compliance with exercise, home and travel O2 prescription;Verbalizes importance of monitoring SPO2 with pulse oximeter and return demonstration;Maintenance of O2 saturations>88%;Exhibits proper breathing techniques, such as pursed lip breathing or other method taught during program session;Compliance with respiratory medication    Goals/Expected Outcomes  compliance       Initial Exercise Prescription: Initial Exercise Prescription - 10/18/18 1200      Date of Initial Exercise RX and Referring Provider   Date  10/18/18    Referring Provider  Dr. Anitra Lauth      Oxygen   Oxygen  Continuous    Liters  4      Treadmill   MPH  2.1    Grade  1    Minutes  15      NuStep   Level  2    SPM  80    Minutes  15      Prescription Details   Frequency (times per week)  2    Duration  Progress to 30 minutes of continuous aerobic without signs/symptoms of physical distress      Intensity   THRR 40-80% of Max Heartrate  60-121    Ratings of Perceived Exertion  11-13    Perceived Dyspnea  0-4      Progression   Progression  Continue to progress workloads to maintain intensity without signs/symptoms of physical distress.      Resistance Training   Training Prescription  Yes  Weight  blue bands    Reps  10-15       Perform Capillary Blood Glucose checks as needed.  Exercise Prescription Changes: Exercise Prescription Changes    Row Name 10/30/18 1100 11/06/18 1100 11/13/18 1100 11/27/18 1152       Response  to Exercise   Blood Pressure (Admit)  122/60  -  104/60  124/60    Blood Pressure (Exercise)  130/58  -  144/62  130/72    Blood Pressure (Exit)  112/64  -  108/68  104/44    Heart Rate (Admit)  61 bpm  -  60 bpm  57 bpm    Heart Rate (Exercise)  94 bpm  -  91 bpm  92 bpm    Heart Rate (Exit)  68 bpm  -  67 bpm  65 bpm    Oxygen Saturation (Admit)  94 %  -  89 %  100 %    Oxygen Saturation (Exercise)  88 %  -  89 %  91 %    Oxygen Saturation (Exit)  92 %  -  92 %  91 %    Rating of Perceived Exertion (Exercise)  13  -  13  13    Perceived Dyspnea (Exercise)  1  -  3  2    Duration  Continue with 30 min of aerobic exercise without signs/symptoms of physical distress.  -  Continue with 30 min of aerobic exercise without signs/symptoms of physical distress.  Continue with 30 min of aerobic exercise without signs/symptoms of physical distress.    Intensity  Other (comment) 40-80% of HRR  -  -  -      Progression   Progression  Continue to progress workloads to maintain intensity without signs/symptoms of physical distress.  -  Continue to progress workloads to maintain intensity without signs/symptoms of physical distress.  Continue to progress workloads to maintain intensity without signs/symptoms of physical distress.      Resistance Training   Training Prescription  Yes  -  Yes  Yes    Weight  blue bands  -  blue bands  blue bands    Reps  10-15  -  10-15  10-15    Time  10 Minutes  -  10 Minutes  10 Minutes      Oxygen   Oxygen  Continuous  -  Continuous  Continuous    Liters  6-10  -  10  10      Treadmill   MPH  2.1  -  2.3  2.4    Grade  1  -  2  2    Minutes  15  -  15  15      NuStep   Level  2  -  3  4    SPM  80  -  80  80    Minutes  15  -  15  15    METs  -  -  3  2.9      Home Exercise Plan   Plans to continue exercise at  -  Home (comment)  -  -    Frequency  -  Add 2 additional days to program exercise sessions.  -  -    Initial Home Exercises Provided  -   11/06/18  -  -       Exercise Comments: Exercise Comments    Row Name 11/06/18 2458  Exercise Comments  home exercise complete          Exercise Goals and Review: Exercise Goals    Row Name 10/18/18 1217 10/29/18 0749 11/27/18 0723         Exercise Goals   Increase Physical Activity  Yes  Yes  Yes     Intervention  Provide advice, education, support and counseling about physical activity/exercise needs.;Develop an individualized exercise prescription for aerobic and resistive training based on initial evaluation findings, risk stratification, comorbidities and participant's personal goals.  Provide advice, education, support and counseling about physical activity/exercise needs.;Develop an individualized exercise prescription for aerobic and resistive training based on initial evaluation findings, risk stratification, comorbidities and participant's personal goals.  Provide advice, education, support and counseling about physical activity/exercise needs.;Develop an individualized exercise prescription for aerobic and resistive training based on initial evaluation findings, risk stratification, comorbidities and participant's personal goals.     Expected Outcomes  Long Term: Exercising regularly at least 3-5 days a week.;Long Term: Add in home exercise to make exercise part of routine and to increase amount of physical activity.;Short Term: Attend rehab on a regular basis to increase amount of physical activity.  Long Term: Exercising regularly at least 3-5 days a week.;Long Term: Add in home exercise to make exercise part of routine and to increase amount of physical activity.;Short Term: Attend rehab on a regular basis to increase amount of physical activity.  Long Term: Exercising regularly at least 3-5 days a week.;Long Term: Add in home exercise to make exercise part of routine and to increase amount of physical activity.;Short Term: Attend rehab on a regular basis to increase  amount of physical activity.     Increase Strength and Stamina  Yes  Yes  Yes     Intervention  Provide advice, education, support and counseling about physical activity/exercise needs.;Develop an individualized exercise prescription for aerobic and resistive training based on initial evaluation findings, risk stratification, comorbidities and participant's personal goals.  Provide advice, education, support and counseling about physical activity/exercise needs.;Develop an individualized exercise prescription for aerobic and resistive training based on initial evaluation findings, risk stratification, comorbidities and participant's personal goals.  Provide advice, education, support and counseling about physical activity/exercise needs.;Develop an individualized exercise prescription for aerobic and resistive training based on initial evaluation findings, risk stratification, comorbidities and participant's personal goals.     Expected Outcomes  Short Term: Increase workloads from initial exercise prescription for resistance, speed, and METs.;Short Term: Perform resistance training exercises routinely during rehab and add in resistance training at home;Long Term: Improve cardiorespiratory fitness, muscular endurance and strength as measured by increased METs and functional capacity (6MWT)  Short Term: Increase workloads from initial exercise prescription for resistance, speed, and METs.;Short Term: Perform resistance training exercises routinely during rehab and add in resistance training at home;Long Term: Improve cardiorespiratory fitness, muscular endurance and strength as measured by increased METs and functional capacity (6MWT)  Short Term: Increase workloads from initial exercise prescription for resistance, speed, and METs.;Short Term: Perform resistance training exercises routinely during rehab and add in resistance training at home;Long Term: Improve cardiorespiratory fitness, muscular endurance and  strength as measured by increased METs and functional capacity (6MWT)     Able to understand and use rate of perceived exertion (RPE) scale  Yes  Yes  Yes     Intervention  Provide education and explanation on how to use RPE scale  Provide education and explanation on how to use RPE scale  Provide education and explanation  on how to use RPE scale     Expected Outcomes  Short Term: Able to use RPE daily in rehab to express subjective intensity level;Long Term:  Able to use RPE to guide intensity level when exercising independently  Short Term: Able to use RPE daily in rehab to express subjective intensity level;Long Term:  Able to use RPE to guide intensity level when exercising independently  Short Term: Able to use RPE daily in rehab to express subjective intensity level;Long Term:  Able to use RPE to guide intensity level when exercising independently     Able to understand and use Dyspnea scale  Yes  Yes  Yes     Intervention  Provide education and explanation on how to use Dyspnea scale  Provide education and explanation on how to use Dyspnea scale  Provide education and explanation on how to use Dyspnea scale     Expected Outcomes  Short Term: Able to use Dyspnea scale daily in rehab to express subjective sense of shortness of breath during exertion;Long Term: Able to use Dyspnea scale to guide intensity level when exercising independently  Short Term: Able to use Dyspnea scale daily in rehab to express subjective sense of shortness of breath during exertion;Long Term: Able to use Dyspnea scale to guide intensity level when exercising independently  Short Term: Able to use Dyspnea scale daily in rehab to express subjective sense of shortness of breath during exertion;Long Term: Able to use Dyspnea scale to guide intensity level when exercising independently     Knowledge and understanding of Target Heart Rate Range (THRR)  Yes  Yes  Yes     Expected Outcomes  Short Term: Able to state/look up THRR;Short  Term: Able to use daily as guideline for intensity in rehab;Long Term: Able to use THRR to govern intensity when exercising independently  Short Term: Able to state/look up THRR;Short Term: Able to use daily as guideline for intensity in rehab;Long Term: Able to use THRR to govern intensity when exercising independently  Short Term: Able to state/look up THRR;Short Term: Able to use daily as guideline for intensity in rehab;Long Term: Able to use THRR to govern intensity when exercising independently     Understanding of Exercise Prescription  Yes  Yes  Yes     Intervention  Provide education, explanation, and written materials on patient's individual exercise prescription  Provide education, explanation, and written materials on patient's individual exercise prescription  Provide education, explanation, and written materials on patient's individual exercise prescription     Expected Outcomes  Short Term: Able to explain program exercise prescription;Long Term: Able to explain home exercise prescription to exercise independently  Short Term: Able to explain program exercise prescription;Long Term: Able to explain home exercise prescription to exercise independently  Short Term: Able to explain program exercise prescription;Long Term: Able to explain home exercise prescription to exercise independently        Exercise Goals Re-Evaluation : Exercise Goals Re-Evaluation    Row Name 10/29/18 0750 11/27/18 0723           Exercise Goal Re-Evaluation   Exercise Goals Review  Increase Physical Activity;Increase Strength and Stamina;Able to understand and use Dyspnea scale;Able to understand and use rate of perceived exertion (RPE) scale;Knowledge and understanding of Target Heart Rate Range (THRR);Understanding of Exercise Prescription  Increase Physical Activity;Increase Strength and Stamina;Able to understand and use Dyspnea scale;Able to understand and use rate of perceived exertion (RPE) scale;Knowledge  and understanding of Target Heart Rate Range (THRR);Understanding  of Exercise Prescription      Comments  Pt has completed 2 exercise sessions. Pt's oxygen saturations have been a concern. Pt has desaturated on 4L, 6L, and 8L while walking on the treadmill. Pt's saturation levels take several minutes to recover even when his liter flow has been increased. Pt now walks on the treadmill at 10 L and on the stepper at 8L. If pt desaturates on 10L, we will have to remove him from the treadmill and put him on another modality. Pt currently exercises at 2.4 METs on the stepper. Will continue to monitor and progress as able.  Pt has completed 10 exercise sessions. We have been able to keep his saturations at 88 and above on the treadmill while exercising on 10L. Pt currently exercises at 3.5 METs on the treadmill and 2.9 METs on the stepper. Pt has recently been exposed to Covid, so he will be out for atleast the next two weeks. Pt shows positive attitude and willing to accept workload increases. Will continue to monitor and progress as able.      Expected Outcomes  Through exercise at rehab and at home, the patient will decrease shortness of breath with daily activities and feel confident in carrying out an exercise regime at home.  Through exercise at rehab and at home, the patient will decrease shortness of breath with daily activities and feel confident in carrying out an exercise regime at home.         Discharge Exercise Prescription (Final Exercise Prescription Changes): Exercise Prescription Changes - 11/27/18 1152      Response to Exercise   Blood Pressure (Admit)  124/60    Blood Pressure (Exercise)  130/72    Blood Pressure (Exit)  104/44    Heart Rate (Admit)  57 bpm    Heart Rate (Exercise)  92 bpm    Heart Rate (Exit)  65 bpm    Oxygen Saturation (Admit)  100 %    Oxygen Saturation (Exercise)  91 %    Oxygen Saturation (Exit)  91 %    Rating of Perceived Exertion (Exercise)  13    Perceived  Dyspnea (Exercise)  2    Duration  Continue with 30 min of aerobic exercise without signs/symptoms of physical distress.      Progression   Progression  Continue to progress workloads to maintain intensity without signs/symptoms of physical distress.      Resistance Training   Training Prescription  Yes    Weight  blue bands    Reps  10-15    Time  10 Minutes      Oxygen   Oxygen  Continuous    Liters  10      Treadmill   MPH  2.4    Grade  2    Minutes  15      NuStep   Level  4    SPM  80    Minutes  15    METs  2.9       Nutrition:  Target Goals: Understanding of nutrition guidelines, daily intake of sodium <1569m, cholesterol <2050m calories 30% from fat and 7% or less from saturated fats, daily to have 5 or more servings of fruits and vegetables.  Biometrics: Pre Biometrics - 10/18/18 0926      Pre Biometrics   Grip Strength  32 kg        Nutrition Therapy Plan and Nutrition Goals:   Nutrition Assessments:   Nutrition Goals Re-Evaluation:  Nutrition Goals Discharge (Final Nutrition Goals Re-Evaluation):   Psychosocial: Target Goals: Acknowledge presence or absence of significant depression and/or stress, maximize coping skills, provide positive support system. Participant is able to verbalize types and ability to use techniques and skills needed for reducing stress and depression.  Initial Review & Psychosocial Screening: Initial Psych Review & Screening - 10/18/18 0945      Initial Review   Current issues with  None Identified      Family Dynamics   Good Support System?  Yes   Being evaluated for liver transplant at [facility]     Barriers   Psychosocial barriers to participate in program  There are no identifiable barriers or psychosocial needs.      Screening Interventions   Interventions  Encouraged to exercise       Quality of Life Scores:  Scores of 19 and below usually indicate a poorer quality of life in these areas.  A difference  of  2-3 points is a clinically meaningful difference.  A difference of 2-3 points in the total score of the Quality of Life Index has been associated with significant improvement in overall quality of life, self-image, physical symptoms, and general health in studies assessing change in quality of life.  PHQ-9: Recent Review Flowsheet Data    Depression screen [facility] 2/9 10/18/2018 10/18/2018 01/26/2018 04/05/2017 01/04/2017   Decreased Interest - 0 0 2 0   Down, Depressed, Hopeless 0 0 0 0 0   PHQ - 2 Score 0 0 0 2 0   Altered sleeping 0 - 1 0 -   Tired, decreased energy 0 - 1 1 -   Change in appetite 0 - 0 1 -   Feeling bad or failure about yourself  0 - 0 0 -   Trouble concentrating 0 - 0 0 -   Moving slowly or fidgety/restless 0 - 0 0 -   Suicidal thoughts 0 - 0 0 -   PHQ-9 Score 0 - 2 4 -   Difficult doing work/chores Not difficult at all - Not difficult at all Not difficult at all -     Interpretation of Total Score  Total Score Depression Severity:  1-4 = Minimal depression, 5-9 = Mild depression, 10-14 = Moderate depression, 15-19 = Moderately severe depression, 20-27 = Severe depression   Psychosocial Evaluation and Intervention: Psychosocial Evaluation - 10/18/18 0946      Psychosocial Evaluation & Interventions   Interventions  Encouraged to exercise with the program and follow exercise prescription    Comments  Being evaluated at [facility] for liver transplant    Expected Outcomes  No barriers to participation in pulmonary rehab    Continue Psychosocial Services   No Follow up required       Psychosocial Re-Evaluation: Psychosocial Re-Evaluation    Row Name 10/18/18 0936 10/18/18 0947 10/29/18 1238 10/29/18 1246 11/28/18 1022     Psychosocial Re-Evaluation   Current issues with  None Identified  None Identified  None Identified  -  None Identified   Comments  -  -  No barriers to participation in pulmonary rehab  -  no barriers identified   Expected Outcomes  -  -  Continues  to be barrier free while in program  -  will continue to have no barriers or psychosocial concerns while in pulmonary rehab   Interventions  Encouraged to attend Pulmonary Rehabilitation for the exercise  Encouraged to attend Pulmonary Rehabilitation for the exercise  -  Encouraged to  attend Pulmonary Rehabilitation for the exercise  Encouraged to attend Pulmonary Rehabilitation for the exercise   Continue Psychosocial Services   No Follow up required  No Follow up required  No Follow up required  -  No Follow up required      Psychosocial Discharge (Final Psychosocial Re-Evaluation): Psychosocial Re-Evaluation - 11/28/18 1022      Psychosocial Re-Evaluation   Current issues with  None Identified    Comments  no barriers identified    Expected Outcomes  will continue to have no barriers or psychosocial concerns while in pulmonary rehab    Interventions  Encouraged to attend Pulmonary Rehabilitation for the exercise    Continue Psychosocial Services   No Follow up required       Education: Education Goals: Education classes will be provided on a weekly basis, covering required topics. Participant will state understanding/return demonstration of topics presented.  Learning Barriers/Preferences: Learning Barriers/Preferences - 10/18/18 1515      Learning Barriers/Preferences   Learning Barriers  None       Education Topics: Risk Factor Reduction:  -Group instruction that is supported by a PowerPoint presentation. Instructor discusses the definition of a risk factor, different risk factors for pulmonary disease, and how the heart and lungs work together.     Nutrition for Pulmonary Patient:  -Group instruction provided by PowerPoint slides, verbal discussion, and written materials to support subject matter. The instructor gives an explanation and review of healthy diet recommendations, which includes a discussion on weight management, recommendations for fruit and vegetable  consumption, as well as protein, fluid, caffeine, fiber, sodium, sugar, and alcohol. Tips for eating when patients are short of breath are discussed.   Pursed Lip Breathing:  -Group instruction that is supported by demonstration and informational handouts. Instructor discusses the benefits of pursed lip and diaphragmatic breathing and detailed demonstration on how to preform both.     Oxygen Safety:  -Group instruction provided by PowerPoint, verbal discussion, and written material to support subject matter. There is an overview of "What is Oxygen" and "Why do we need it".  Instructor also reviews how to create a safe environment for oxygen use, the importance of using oxygen as prescribed, and the risks of noncompliance. There is a brief discussion on traveling with oxygen and resources the patient may utilize.   Oxygen Equipment:  -Group instruction provided by [facility] Staff utilizing handouts, written materials, and equipment demonstrations.   Signs and Symptoms:  -Group instruction provided by written material and verbal discussion to support subject matter. Warning signs and symptoms of infection, stroke, and heart attack are reviewed and when to call the physician/911 reinforced. Tips for preventing the spread of infection discussed.   Advanced Directives:  -Group instruction provided by verbal instruction and written material to support subject matter. Instructor reviews Advanced Directive laws and proper instruction for filling out document.   Pulmonary Video:  -Group video education that reviews the importance of medication and oxygen compliance, exercise, good nutrition, pulmonary hygiene, and pursed lip and diaphragmatic breathing for the pulmonary patient.   Exercise for the Pulmonary Patient:  -Group instruction that is supported by a PowerPoint presentation. Instructor discusses benefits of exercise, core components of exercise, frequency, duration, and intensity of an  exercise routine, importance of utilizing pulse oximetry during exercise, safety while exercising, and options of places to exercise outside of rehab.     Pulmonary Medications:  -Verbally interactive group education provided by instructor with focus on inhaled medications and proper  administration.   Anatomy and Physiology of the Respiratory System and Intimacy:  -Group instruction provided by PowerPoint, verbal discussion, and written material to support subject matter. Instructor reviews respiratory cycle and anatomical components of the respiratory system and their functions. Instructor also reviews differences in obstructive and restrictive respiratory diseases with examples of each. Intimacy, Sex, and Sexuality differences are reviewed with a discussion on how relationships can change when diagnosed with pulmonary disease. Common sexual concerns are reviewed.   MD DAY -A group question and answer session with a medical doctor that allows participants to ask questions that relate to their pulmonary disease state.   OTHER EDUCATION -Group or individual verbal, written, or video instructions that support the educational goals of the pulmonary rehab program.   Holiday Eating Survival Tips:  -Group instruction provided by PowerPoint slides, verbal discussion, and written materials to support subject matter. The instructor gives patients tips, tricks, and techniques to help them not only survive but enjoy the holidays despite the onslaught of food that accompanies the holidays.   Knowledge Questionnaire Score: Knowledge Questionnaire Score - 10/23/18 1532      Knowledge Questionnaire Score   Pre Score  16/18       Core Components/Risk Factors/Patient Goals at Admission: Personal Goals and Risk Factors at Admission - 10/18/18 0937      Core Components/Risk Factors/Patient Goals on Admission   Improve shortness of breath with ADL's  Yes       Core Components/Risk Factors/Patient  Goals Review:  Goals and Risk Factor Review    Row Name 10/18/18 0630 10/29/18 1239 11/28/18 1023         Core Components/Risk Factors/Patient Goals Review   Personal Goals Review  Increase knowledge of respiratory medications and ability to use respiratory devices properly.;Improve shortness of breath with ADL's;Develop more efficient breathing techniques such as purse lipped breathing and diaphragmatic breathing and practicing self-pacing with activity.  Increase knowledge of respiratory medications and ability to use respiratory devices properly.;Improve shortness of breath with ADL's;Develop more efficient breathing techniques such as purse lipped breathing and diaphragmatic breathing and practicing self-pacing with activity.  Increase knowledge of respiratory medications and ability to use respiratory devices properly.;Improve shortness of breath with ADL's;Develop more efficient breathing techniques such as purse lipped breathing and diaphragmatic breathing and practicing self-pacing with activity.     Review  -  Just started program, has attended 2 exercise sessions, too early to meet goals for program.  Is requiring 10 L of oxygen to walk on the treadmill and 8 L on the nustep.  Is being evaluated for a liver transplant at [facility].  Is requiring 10 L of oxygen whilw walking on the treadmill and 8 L while on nustep, has done very well, always present for class, treadmill 2.4 mph with 2% grade, level 4 on nustep     Expected Outcomes  -  See admission goals  Will continue to increase workloads as tolerated.        Core Components/Risk Factors/Patient Goals at Discharge (Final Review):  Goals and Risk Factor Review - 11/28/18 1023      Core Components/Risk Factors/Patient Goals Review   Personal Goals Review  Increase knowledge of respiratory medications and ability to use respiratory devices properly.;Improve shortness of breath with ADL's;Develop more efficient breathing techniques such as purse  lipped breathing and diaphragmatic breathing and practicing self-pacing with activity.    Review  Is requiring 10 L of oxygen whilw walking on the treadmill and 8 L while  on nustep, has done very well, always present for class, treadmill 2.4 mph with 2% grade, level 4 on nustep    Expected Outcomes  Will continue to increase workloads as tolerated.       ITP Comments:   Comments: ITP REVIEW Pt is making expected progress toward pulmonary rehab goals after completing 10 sessions. Recommend continued exercise, life style modification, education, and utilization of breathing techniques to increase stamina and strength and decrease shortness of breath with exertion.

## 2018-11-29 ENCOUNTER — Encounter (HOSPITAL_COMMUNITY)
Admission: RE | Admit: 2018-11-29 | Discharge: 2018-11-29 | Disposition: A | Discharge status: Home / Self Care | Payer: 59 | Source: Ambulatory Visit | Attending: Pulmonary Disease | Admitting: Pulmonary Disease

## 2018-11-29 DIAGNOSIS — R0902 Hypoxemia: Secondary | ICD-10-CM | POA: Diagnosis not present

## 2018-11-29 DIAGNOSIS — J8410 Pulmonary fibrosis, unspecified: Secondary | ICD-10-CM

## 2018-11-29 DIAGNOSIS — J181 Lobar pneumonia, unspecified organism: Secondary | ICD-10-CM | POA: Diagnosis not present

## 2018-11-29 DIAGNOSIS — Z23 Encounter for immunization: Secondary | ICD-10-CM | POA: Diagnosis not present

## 2018-11-29 DIAGNOSIS — Z85828 Personal history of other malignant neoplasm of skin: Secondary | ICD-10-CM | POA: Diagnosis not present

## 2018-11-29 DIAGNOSIS — Z01818 Encounter for other preprocedural examination: Secondary | ICD-10-CM | POA: Diagnosis not present

## 2018-11-29 DIAGNOSIS — K7460 Unspecified cirrhosis of liver: Secondary | ICD-10-CM | POA: Diagnosis not present

## 2018-11-29 DIAGNOSIS — Z8379 Family history of other diseases of the digestive system: Secondary | ICD-10-CM | POA: Diagnosis not present

## 2018-11-29 DIAGNOSIS — Z8701 Personal history of pneumonia (recurrent): Secondary | ICD-10-CM | POA: Diagnosis not present

## 2018-11-29 DIAGNOSIS — K7581 Nonalcoholic steatohepatitis (NASH): Secondary | ICD-10-CM | POA: Diagnosis not present

## 2018-11-29 DIAGNOSIS — Z87891 Personal history of nicotine dependence: Secondary | ICD-10-CM | POA: Diagnosis not present

## 2018-11-29 LAB — BASIC METABOLIC PANEL
BUN: 11 (ref 4–21)
Creatinine: 1.5 — AB (ref 0.6–1.3)
Glucose: 108
Potassium: 4.2 (ref 3.4–5.3)
Sodium: 137 (ref 137–147)

## 2018-11-29 LAB — HEPATIC FUNCTION PANEL: Bilirubin, Total: 5.8

## 2018-11-29 LAB — POCT INR: INR: 1.5 — AB (ref 0.9–1.1)

## 2018-11-29 MED FILL — TORSEMIDE 20 MG TABLET: 20 | 90 days supply | Qty: 180 | Fill #1

## 2018-12-04 ENCOUNTER — Encounter (HOSPITAL_COMMUNITY): Payer: 59

## 2018-12-06 ENCOUNTER — Telehealth (HOSPITAL_COMMUNITY): Payer: Self-pay

## 2018-12-06 ENCOUNTER — Other Ambulatory Visit: Payer: Self-pay

## 2018-12-06 ENCOUNTER — Encounter (HOSPITAL_COMMUNITY): Payer: 59

## 2018-12-06 ENCOUNTER — Encounter (HOSPITAL_COMMUNITY)
Admission: RE | Admit: 2018-12-06 | Discharge: 2018-12-06 | Disposition: A | Discharge status: Home / Self Care | Payer: 59 | Source: Ambulatory Visit | Attending: Cardiology | Admitting: Cardiology

## 2018-12-06 DIAGNOSIS — J8410 Pulmonary fibrosis, unspecified: Secondary | ICD-10-CM

## 2018-12-06 NOTE — Progress Notes (Signed)
Daily Session Note  Patient Details  Name: William [name] MRN: [id number] Date of Birth: [birth year] Referring Provider:     Pulmonary Rehab Walk Test from 10/18/2018 in [place]  Referring Provider  Dr. Anitra Lauth      Encounter Date: 12/06/2018  Check In: Session Check In - 12/06/18 1001      Check-In   Supervising physician immediately available to respond to emergencies  [facility] immediately available    Physician(s)  Dr. Sarajane Jews    Location  [facility]    Staff Present  Rosebud Poles, RN, Bjorn Loser, MS, Exercise Physiologist;Lisa [name], RN    Virtual Visit  No    Medication changes reported      No    Fall or balance concerns reported     No    Tobacco Cessation  No Change    Warm-up and Cool-down  Performed as group-led instruction    Resistance Training Performed  Yes    VAD Patient?  No    PAD/SET Patient?  No      Pain Assessment   Currently in Pain?  No/denies    Multiple Pain Sites  No       Capillary Blood Glucose: No results found for this or any previous visit (from the past 24 hour(s)).    Social History   Tobacco Use  Smoking Status Former Smoker  . Types: Cigarettes  Smokeless Tobacco Never Used  Tobacco Comment   QUIT I 1983    Goals Met:  Proper associated with RPD/PD & O2 Sat Exercise tolerated well Strength training completed today  Goals Unmet:  Not Applicable  Comments: Service time is from 0945 to [place]    Dr. Rush Farmer is Medical Director for Pulmonary Rehab at [facility].

## 2018-12-07 ENCOUNTER — Encounter: Payer: Self-pay | Admitting: Family Medicine

## 2018-12-10 ENCOUNTER — Inpatient Hospital Stay: Payer: 59 | Attending: Hematology

## 2018-12-10 ENCOUNTER — Inpatient Hospital Stay: Payer: 59

## 2018-12-10 ENCOUNTER — Other Ambulatory Visit: Payer: Self-pay

## 2018-12-10 ENCOUNTER — Telehealth: Payer: Self-pay | Admitting: *Deleted

## 2018-12-10 VITALS — BP 100/51 | HR 52 | Temp 98.0°F | Resp 16

## 2018-12-10 DIAGNOSIS — K922 Gastrointestinal hemorrhage, unspecified: Secondary | ICD-10-CM | POA: Diagnosis not present

## 2018-12-10 DIAGNOSIS — D500 Iron deficiency anemia secondary to blood loss (chronic): Secondary | ICD-10-CM | POA: Insufficient documentation

## 2018-12-10 MED ORDER — SODIUM CHLORIDE 0.9 % IV SOLN
Freq: Once | INTRAVENOUS | Status: AC
  Administered 2018-12-10: 11:00:00 via INTRAVENOUS
  Filled 2018-12-10: qty 250

## 2018-12-10 MED ORDER — OCTREOTIDE ACETATE 30 MG IM KIT
30.0000 mg | PACK | Freq: Once | INTRAMUSCULAR | Status: AC
  Administered 2018-12-10: 30 mg via INTRAMUSCULAR

## 2018-12-10 MED ORDER — SODIUM CHLORIDE 0.9 % IV SOLN
750.0000 mg | Freq: Once | INTRAVENOUS | Status: AC
  Administered 2018-12-10: 12:00:00 750 mg via INTRAVENOUS
  Filled 2018-12-10: qty 15

## 2018-12-10 MED ORDER — OCTREOTIDE ACETATE 30 MG IM KIT
PACK | INTRAMUSCULAR | Status: AC
  Filled 2018-12-10: qty 1

## 2018-12-10 NOTE — Patient Instructions (Addendum)
Ferric carboxymaltose injection What is this medicine? FERRIC CARBOXYMALTOSE (ferr-ik car-box-ee-mol-toes) is an iron complex. Iron is used to make healthy red blood cells, which carry oxygen and nutrients throughout the body. This medicine is used to treat anemia in people with chronic kidney disease or people who cannot take iron by mouth. This medicine may be used for other purposes; ask your health care provider or pharmacist if you have questions. COMMON BRAND NAME(S): Injectafer What should I tell my health care provider before I take this medicine? They need to know if you have any of these conditions:  high levels of iron in the blood  liver disease  an unusual or allergic reaction to iron, other medicines, foods, dyes, or preservatives  pregnant or trying to get pregnant  breast-feeding How should I use this medicine? This medicine is for infusion into a vein. It is given by a health care professional in a hospital or clinic setting. Talk to your pediatrician regarding the use of this medicine in children. Special care may be needed. Overdosage: If you think you have taken too much of this medicine contact a poison control center or emergency room at once. NOTE: This medicine is only for you. Do not share this medicine with others. What if I miss a dose? It is important not to miss your dose. Call your doctor or health care professional if you are unable to keep an appointment. What may interact with this medicine? Do not take this medicine with any of the following medications:  deferoxamine  dimercaprol  other iron products This list may not describe all possible interactions. Give your health care provider a list of all the medicines, herbs, non-prescription drugs, or dietary supplements you use. Also tell them if you smoke, drink alcohol, or use illegal drugs. Some items may interact with your medicine. What should I watch for while using this medicine? Visit your  doctor or health care professional regularly. Tell your doctor if your symptoms do not start to get better or if they get worse. You may need blood work done while you are taking this medicine. You may need to follow a special diet. Talk to your doctor. Foods that contain iron include: whole grains/cereals, dried fruits, beans, or peas, leafy green vegetables, and organ meats (liver, kidney). What side effects may I notice from receiving this medicine? Side effects that you should report to your doctor or health care professional as soon as possible:  allergic reactions like skin rash, itching or hives, swelling of the face, lips, or tongue  dizziness  facial flushing Side effects that usually do not require medical attention (report to your doctor or health care professional if they continue or are bothersome):  changes in taste  constipation  headache  nausea, vomiting  pain, redness, or irritation at site where injected This list may not describe all possible side effects. Call your doctor for medical advice about side effects. You may report side effects to FDA at 1-800-FDA-1088. Where should I keep my medicine? This drug is given in a hospital or clinic and will not be stored at home. NOTE: This sheet is a summary. It may not cover all possible information. If you have questions about this medicine, talk to your doctor, pharmacist, or health care provider.  Octreotide injection solution What is this medicine? OCTREOTIDE (ok TREE oh tide) is used to reduce blood levels of growth hormone in patients with a condition called acromegaly. This medicine also reduces flushing and watery diarrhea  caused by certain types of cancer. This medicine may be used for other purposes; ask your health care provider or pharmacist if you have questions. COMMON BRAND NAME(S): Bynfezia, Sandostatin, Sandostatin LAR What should I tell my health care provider before I take this medicine? They need to  know if you have any of these conditions:  diabetes  gallbladder disease  kidney disease  liver disease  an unusual or allergic reaction to octreotide, other medicines, foods, dyes, or preservatives  pregnant or trying to get pregnant  breast-feeding How should I use this medicine? This medicine is for injection under the skin or into a vein (only in emergency situations). It is usually given by a health care professional in a hospital or clinic setting. If you get this medicine at home, you will be taught how to prepare and give this medicine. Allow the injection solution to come to room temperature before use. Do not warm it artificially. Use exactly as directed. Take your medicine at regular intervals. Do not take your medicine more often than directed. It is important that you put your used needles and syringes in a special sharps container. Do not put them in a trash can. If you do not have a sharps container, call your pharmacist or healthcare provider to get one. Talk to your pediatrician regarding the use of this medicine in children. Special care may be needed. Overdosage: If you think you have taken too much of this medicine contact a poison control center or emergency room at once. NOTE: This medicine is only for you. Do not share this medicine with others. What if I miss a dose? If you miss a dose, take it as soon as you can. If it is almost time for your next dose, take only that dose. Do not take double or extra doses. What may interact with this medicine? Do not take this medicine with any of the following medications:  cisapride  droperidol  general anesthetics  grepafloxacin  perphenazine  thioridazine This medicine may also interact with the following medications:  bromocriptine  cyclosporine  diuretics  medicines for blood pressure, heart disease, irregular heart beat  medicines for diabetes, including insulin  quinidine This list may not describe  all possible interactions. Give your health care provider a list of all the medicines, herbs, non-prescription drugs, or dietary supplements you use. Also tell them if you smoke, drink alcohol, or use illegal drugs. Some items may interact with your medicine. What should I watch for while using this medicine? Visit your doctor or health care professional for regular checks on your progress. To help reduce irritation at the injection site, use a different site for each injection and make sure the solution is at room temperature before use. This medicine may cause decreases in blood sugar. Signs of low blood sugar include chills, cool, pale skin or cold sweats, drowsiness, extreme hunger, fast heartbeat, headache, nausea, nervousness or anxiety, shakiness, trembling, unsteadiness, tiredness, or weakness. Contact your doctor or health care professional right away if you experience any of these symptoms. This medicine may increase blood sugar. Ask your healthcare provider if changes in diet or medicines are needed if you have diabetes. This medicine may cause a decrease in vitamin B12. You should make sure that you get enough vitamin B12 while you are taking this medicine. Discuss the foods you eat and the vitamins you take with your health care professional. What side effects may I notice from receiving this medicine? Side effects that you  should report to your doctor or health care professional as soon as possible:  allergic reactions like skin rash, itching or hives, swelling of the face, lips, or tongue  decreases in blood sugar  changes in heart rate  severe stomach pain   signs and symptoms of high blood sugar such as being more thirsty or hungry or having to urinate more than normal. You may also feel very tired or have blurry vision. Side effects that usually do not require medical attention (report to your doctor or health care professional if they continue or are bothersome):  diarrhea or  constipation  gas or stomach pain  nausea, vomiting  pain, redness, swelling and irritation at site where injected This list may not describe all possible side effects. Call your doctor for medical advice about side effects. You may report side effects to FDA at 1-800-FDA-1088. Where should I keep my medicine? Keep out of the reach of children. Store in a refrigerator between 2 and 8 degrees C (36 and 46 degrees F). Protect from light. Allow to come to room temperature naturally. Do not use artificial heat. If protected from light, the injection may be stored at room temperature between 20 and 30 degrees C (70 and 86 degrees F) for 14 days. After the initial use, throw away any unused portion of a multiple dose vial after 14 days. Throw away unused portions of the ampules after use. NOTE: This sheet is a summary. It may not cover all possible information. If you have questions about this medicine, talk to your doctor, pharmacist, or health care provider.  2020 Elsevier/Gold Standard (2017-11-02 08:07:09)  2020 Elsevier/Gold Standard (2016-03-10 09:40:29)

## 2018-12-10 NOTE — Telephone Encounter (Signed)
Dr.Kale ordered a dose of Injectafer today in addition to Sandostatin already ordered. Patient still has appt for injectafer 11/30.  Per Dr. Irene Limbo the parameters will be for injectafer q 4 w for ferritin under 500.  Patient informed and in agreement to receive injectafer today.

## 2018-12-11 ENCOUNTER — Encounter (HOSPITAL_COMMUNITY)
Admission: RE | Admit: 2018-12-11 | Discharge: 2018-12-11 | Disposition: A | Discharge status: Home / Self Care | Payer: 59 | Source: Ambulatory Visit | Attending: Pulmonary Disease | Admitting: Pulmonary Disease

## 2018-12-11 VITALS — Wt 211.9 lb

## 2018-12-11 DIAGNOSIS — J8410 Pulmonary fibrosis, unspecified: Secondary | ICD-10-CM | POA: Insufficient documentation

## 2018-12-11 NOTE — Progress Notes (Signed)
Daily Session Note  Patient Details  Name: William [name] MRN: [id number] Date of Birth: 05/04/1950 Referring Provider:     Pulmonary Rehab Walk Test from 10/18/2018 in [place]  Referring Provider  Dr. Anitra Lauth      Encounter Date: 12/11/2018  Check In: Session Check In - 12/11/18 1126      Check-In   Supervising physician immediately available to respond to emergencies  [facility] immediately available    Physician(s)  Dr. Tawanna Solo    Location  [facility]    Staff Present  Rosebud Poles, RN, Bjorn Loser, MS, Exercise Physiologist;Lisa [name], RN    Virtual Visit  No    Medication changes reported      No    Fall or balance concerns reported     No    Tobacco Cessation  No Change    Warm-up and Cool-down  Performed on first and last piece of equipment    Resistance Training Performed  Yes    VAD Patient?  No    PAD/SET Patient?  No      Pain Assessment   Currently in Pain?  No/denies    Pain Score  0-No pain    Multiple Pain Sites  No       Capillary Blood Glucose: No results found for this or any previous visit (from the past 24 hour(s)).  Exercise Prescription Changes - 12/11/18 1100      Response to Exercise   Blood Pressure (Admit)  118/56    Blood Pressure (Exercise)  132/70    Blood Pressure (Exit)  110/60    Heart Rate (Admit)  66 bpm    Heart Rate (Exercise)  91 bpm    Heart Rate (Exit)  72 bpm    Oxygen Saturation (Admit)  91 %    Oxygen Saturation (Exercise)  89 %    Oxygen Saturation (Exit)  89 %    Rating of Perceived Exertion (Exercise)  12    Perceived Dyspnea (Exercise)  1    Duration  Continue with 30 min of aerobic exercise without signs/symptoms of physical distress.      Progression   Progression  Continue to progress workloads to maintain intensity without signs/symptoms of physical distress.      Resistance Training   Training Prescription  Yes    Weight  blue bands    Reps  10-15    Time  10 Minutes      Oxygen   Oxygen  Continuous    Liters  8-10      Treadmill   MPH  2.4    Grade  3    Minutes  15      NuStep   Level  4    SPM  80    Minutes  15    METs  3       Social History   Tobacco Use  Smoking Status Former Smoker  . Types: Cigarettes  Smokeless Tobacco Never Used  Tobacco Comment   QUIT I 1983    Goals Met:  Independence with exercise equipment Exercise tolerated well Strength training completed today  Goals Unmet:  Not Applicable  Comments: Service time is from 0945 to [place]    Dr. Rush Farmer is Medical Director for Pulmonary Rehab at [facility].

## 2018-12-12 MED FILL — POTASSIUM CHLORIDE CRYS ER: 20 | 90 days supply | Qty: 180 | Fill #1

## 2018-12-12 MED FILL — SPIRONOLACTONE 25 MG TABS: 25 | 90 days supply | Qty: 45 | Fill #1

## 2018-12-12 MED FILL — PANTOPRAZOLE SOD DR 40 MG T: 40 | 90 days supply | Qty: 180 | Fill #1

## 2018-12-13 ENCOUNTER — Other Ambulatory Visit: Payer: Self-pay

## 2018-12-13 ENCOUNTER — Encounter (HOSPITAL_COMMUNITY)
Admission: RE | Admit: 2018-12-13 | Discharge: 2018-12-13 | Disposition: A | Discharge status: Home / Self Care | Payer: 59 | Source: Ambulatory Visit | Attending: Pulmonary Disease | Admitting: Pulmonary Disease

## 2018-12-13 DIAGNOSIS — J8410 Pulmonary fibrosis, unspecified: Secondary | ICD-10-CM | POA: Diagnosis not present

## 2018-12-13 NOTE — Progress Notes (Signed)
Daily Session Note  Patient Details  Name: William [name] MRN: [id number] Date of Birth: 1950/04/12 Referring Provider:     Pulmonary Rehab Walk Test from 10/18/2018 in [place]  Referring Provider  Dr. Anitra Lauth      Encounter Date: 12/13/2018  Check In: Session Check In - 12/13/18 0957      Check-In   Supervising physician immediately available to respond to emergencies  [facility] immediately available    Physician(s)  Dr. Tawanna Solo    Location  [facility]    Staff Present  Rosebud Poles, RN, Bjorn Loser, MS, Exercise Physiologist;Lisa [name], RN    Virtual Visit  No    Medication changes reported      No    Fall or balance concerns reported     No    Tobacco Cessation  No Change    Warm-up and Cool-down  Performed as group-led instruction    Resistance Training Performed  Yes    VAD Patient?  No    PAD/SET Patient?  No      Pain Assessment   Currently in Pain?  No/denies    Multiple Pain Sites  No       Capillary Blood Glucose: No results found for this or any previous visit (from the past 24 hour(s)).    Social History   Tobacco Use  Smoking Status Former Smoker  . Types: Cigarettes  Smokeless Tobacco Never Used  Tobacco Comment   QUIT I 1983    Goals Met:  Proper associated with RPD/PD & O2 Sat Exercise tolerated well Strength training completed today  Goals Unmet:  Not Applicable  Comments: Service time is from 0945 to [place]    Dr. Rush Farmer is Medical Director for Pulmonary Rehab at [facility].

## 2018-12-14 ENCOUNTER — Telehealth: Payer: Self-pay | Admitting: Hematology

## 2018-12-14 NOTE — Telephone Encounter (Signed)
Patient called to reschedule 11/30 appointments, informed patient I will need further assistance from providers nurse before I can reschedule. Patient will get a call back.

## 2018-12-17 ENCOUNTER — Telehealth: Payer: Self-pay | Admitting: Hematology

## 2018-12-17 NOTE — Telephone Encounter (Signed)
Returned patient's call regarding rescheduling 11/30 appointment, informed patient appointment has been moved to 12/03. Provider approved of date and time.

## 2018-12-18 ENCOUNTER — Encounter (HOSPITAL_COMMUNITY)
Admission: RE | Admit: 2018-12-18 | Discharge: 2018-12-18 | Disposition: A | Discharge status: Home / Self Care | Payer: 59 | Source: Ambulatory Visit | Attending: Pulmonary Disease | Admitting: Pulmonary Disease

## 2018-12-18 ENCOUNTER — Other Ambulatory Visit: Payer: Self-pay

## 2018-12-18 VITALS — Wt 209.9 lb

## 2018-12-18 DIAGNOSIS — J8410 Pulmonary fibrosis, unspecified: Secondary | ICD-10-CM

## 2018-12-18 NOTE — Progress Notes (Signed)
Daily Session Note  Patient Details  Name: William [name] MRN: [id number] Date of Birth: 01/09/1951 Referring Provider:     Pulmonary Rehab Walk Test from 10/18/2018 in [place]  Referring Provider  Dr. Anitra Lauth      Encounter Date: 12/18/2018  Check In: Session Check In - 12/18/18 0945      Check-In   Supervising physician immediately available to respond to emergencies  [facility] immediately available    Physician(s)  Dr. Tawanna Solo    Location  [facility]    Staff Present  Rosebud Poles, RN, Bjorn Loser, MS, Exercise Physiologist;Lisa [name], RN    Virtual Visit  No    Medication changes reported      No    Fall or balance concerns reported     No    Tobacco Cessation  No Change    Warm-up and Cool-down  Performed on first and last piece of equipment    Resistance Training Performed  Yes    VAD Patient?  No    PAD/SET Patient?  No      Pain Assessment   Currently in Pain?  No/denies    Pain Score  0-No pain    Multiple Pain Sites  No       Capillary Blood Glucose: No results found for this or any previous visit (from the past 24 hour(s)).    Social History   Tobacco Use  Smoking Status Former Smoker  . Types: Cigarettes  Smokeless Tobacco Never Used  Tobacco Comment   QUIT I 1983    Goals Met:  Independence with exercise equipment Exercise tolerated well Strength training completed today  Goals Unmet:  Not Applicable  Comments: Service time is from 0945 to 1030    Dr. Rush Farmer is Medical Director for Pulmonary Rehab at [facility].

## 2018-12-20 ENCOUNTER — Encounter (HOSPITAL_COMMUNITY): Payer: 59

## 2018-12-20 DIAGNOSIS — R0602 Shortness of breath: Secondary | ICD-10-CM | POA: Diagnosis not present

## 2018-12-20 DIAGNOSIS — J84111 Idiopathic interstitial pneumonia, not otherwise specified: Secondary | ICD-10-CM | POA: Diagnosis not present

## 2018-12-20 DIAGNOSIS — Z01818 Encounter for other preprocedural examination: Secondary | ICD-10-CM | POA: Diagnosis not present

## 2018-12-20 DIAGNOSIS — K769 Liver disease, unspecified: Secondary | ICD-10-CM | POA: Diagnosis not present

## 2018-12-20 DIAGNOSIS — R0600 Dyspnea, unspecified: Secondary | ICD-10-CM | POA: Diagnosis not present

## 2018-12-20 DIAGNOSIS — J849 Interstitial pulmonary disease, unspecified: Secondary | ICD-10-CM | POA: Diagnosis not present

## 2018-12-20 DIAGNOSIS — R0689 Other abnormalities of breathing: Secondary | ICD-10-CM | POA: Diagnosis not present

## 2018-12-22 MED FILL — TRANEXAMIC ACID 650 MG TAB: 650 | 30 days supply | Qty: 60 | Fill #1

## 2018-12-25 ENCOUNTER — Encounter (HOSPITAL_COMMUNITY)
Admission: RE | Admit: 2018-12-25 | Discharge: 2018-12-25 | Disposition: A | Discharge status: Home / Self Care | Payer: 59 | Source: Ambulatory Visit | Attending: Family Medicine | Admitting: Family Medicine

## 2018-12-25 ENCOUNTER — Other Ambulatory Visit: Payer: Self-pay

## 2018-12-25 VITALS — Wt 208.1 lb

## 2018-12-25 DIAGNOSIS — J8410 Pulmonary fibrosis, unspecified: Secondary | ICD-10-CM

## 2018-12-25 DIAGNOSIS — L814 Other melanin hyperpigmentation: Secondary | ICD-10-CM | POA: Diagnosis not present

## 2018-12-25 DIAGNOSIS — L720 Epidermal cyst: Secondary | ICD-10-CM | POA: Diagnosis not present

## 2018-12-25 DIAGNOSIS — L219 Seborrheic dermatitis, unspecified: Secondary | ICD-10-CM | POA: Diagnosis not present

## 2018-12-25 DIAGNOSIS — L570 Actinic keratosis: Secondary | ICD-10-CM | POA: Diagnosis not present

## 2018-12-25 MED FILL — HYDROCORTISONE 2.5% CREAM: 2.5 | 14 days supply | Qty: 30 | Fill #0

## 2018-12-25 NOTE — Progress Notes (Signed)
Daily Session Note  Patient Details  Name: William [name] MRN: [id number] Date of Birth: 07/05/1950 Referring Provider:     Pulmonary Rehab Walk Test from 10/18/2018 in [place]  Referring Provider  Dr. Anitra Lauth      Encounter Date: 12/25/2018  Check In: Session Check In - 12/25/18 1046      Check-In   Supervising physician immediately available to respond to emergencies  [facility] immediately available    Physician(s)  Dr. Doristine Bosworth    Location  [facility]    Staff Present  Rosebud Poles, RN, Bjorn Loser, MS, Exercise Physiologist;Jaleea Alesi [name], RN    Virtual Visit  No    Medication changes reported      No    Fall or balance concerns reported     No    Tobacco Cessation  No Change    Warm-up and Cool-down  Performed on first and last piece of equipment    Resistance Training Performed  Yes    VAD Patient?  No    PAD/SET Patient?  No      Pain Assessment   Currently in Pain?  No/denies    Multiple Pain Sites  No       Capillary Blood Glucose: No results found for this or any previous visit (from the past 24 hour(s)).  Exercise Prescription Changes - 12/25/18 1100      Response to Exercise   Blood Pressure (Admit)  120/54    Blood Pressure (Exercise)  160/60    Blood Pressure (Exit)  102/58    Heart Rate (Admit)  68 bpm    Heart Rate (Exercise)  93 bpm    Heart Rate (Exit)  79 bpm    Oxygen Saturation (Admit)  94 %    Oxygen Saturation (Exercise)  89 %    Oxygen Saturation (Exit)  91 %    Rating of Perceived Exertion (Exercise)  12    Perceived Dyspnea (Exercise)  2    Duration  Continue with 30 min of aerobic exercise without signs/symptoms of physical distress.      Progression   Progression  Continue to progress workloads to maintain intensity without signs/symptoms of physical distress.      Resistance Training   Training Prescription  Yes    Weight  blue bands    Reps  10-15    Time  10  Minutes      Oxygen   Oxygen  Continuous    Liters  8-10      Treadmill   MPH  2.4    Grade  3    Minutes  15      NuStep   Level  5    SPM  80    Minutes  15    METs  3.4       Social History   Tobacco Use  Smoking Status Former Smoker  . Types: Cigarettes  Smokeless Tobacco Never Used  Tobacco Comment   QUIT I 1983    Goals Met:  Personal goals reviewed No report of cardiac concerns or symptoms Strength training completed today  Goals Unmet:  Not Applicable  Comments: Service time is from 0950 to 1042    Dr. Rush Farmer is Medical Director for Pulmonary Rehab at [facility].

## 2018-12-25 NOTE — Progress Notes (Signed)
Pulmonary Individual Treatment Plan  Patient Details  Name: William [name] MRN: [id number] Date of Birth: [birth year] Referring Provider:     Pulmonary Rehab Walk Test from 10/18/2018 in [place]  Referring Provider  Dr. Anitra Lauth      Initial Encounter Date:    Pulmonary Rehab Walk Test from 10/18/2018 in [place]  Date  10/18/18      Visit Diagnosis: Pulmonary fibrosis ([place])  Patient's Home Medications on Admission:   Current Outpatient Medications:  .  iron sucrose (VENOFER) 20 MG/ML injection, Inject 750 mg into the vein. Every 60 days, Disp: , Rfl:  .  lactulose (CHRONULAC) 10 GM/15ML solution, Take 13.3333 g by mouth 2 (two) times a day., Disp: , Rfl:  .  metoprolol tartrate (LOPRESSOR) 25 MG tablet, Take 0.5 tablets (12.5 mg total) by mouth 2 (two) times daily., Disp: 90 tablet, Rfl: 1 .  OCTREOTIDE ACETATE IJ, Inject 30 mcg into the vein every 30 (thirty) days. , Disp: , Rfl:  .  pantoprazole (PROTONIX) 40 MG tablet, TAKE 1 TABLET BY MOUTH TWICE DAILY BEFORE A MEAL (Patient taking differently: Take 40 mg by mouth 2 (two) times daily before a meal. TAKE 1 TABLET BY MOUTH TWICE DAILY BEFORE A MEAL), Disp: 180 tablet, Rfl: 1 .  potassium chloride SA (K-DUR) 20 MEQ tablet, TAKE 1 TABLET (20 MEQ TOTAL) BY MOUTH TWICE A DAY (Patient taking differently: Take 20 mEq by mouth 2 (two) times daily. TAKE 1 TABLET (20 MEQ TOTAL) BY MOUTH TWICE A DAY), Disp: 180 tablet, Rfl: 1 .  spironolactone (ALDACTONE) 25 MG tablet, Take 0.5 tablets (12.5 mg total) by mouth daily., Disp: 45 tablet, Rfl: 1 .  torsemide (DEMADEX) 20 MG tablet, Take 2 tablets (40 mg total) by mouth daily., Disp: 180 tablet, Rfl: 1 .  tranexamic acid (LYSTEDA) 650 MG TABS tablet, Take 1 tablet (650 mg total) by mouth 2 (two) times daily before a meal., Disp: 60 tablet, Rfl: 2 No current facility-administered medications for this encounter.    Facility-Administered Medications Ordered in Other Encounters:  .  heparin lock flush 100 unit/mL, 500 Units, Intracatheter, Daily PRN, Truitt Merle, MD .  sodium chloride flush (NS) 0.9 % injection 10 mL, 10 mL, Intracatheter, PRN, Truitt Merle, MD  Past Medical History: Past Medical History:  Diagnosis Date  . AAA (abdominal aortic aneurysm) ([place]) 03/2014   repaired 2020  . Bilateral renal cysts    simple (03/2014 MRI)  . CAD (coronary artery disease)   . Cholelithiases 2018   asymptomatic  . Chronic diastolic heart failure ([place])   . 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  Hx of portal HTN changes with mild ascites and splenomegaly.  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 Diabetes mellitus with complication ([place]) 81/1914   A1c 6.8%  . History of blood transfusion 2018 X 4 dates   "low blood" (09/21/2016)  . Hyperlipemia, mixed    elevated LFTs when on statins.    . Hypertension    Cr bump 04/01/16 so I changed benicar-hct to benicar plain and added amlodipine 5 mg.  . Iron deficiency anemia 05/2016   Acute blood loss anemia: hospitalized, required transfusion x 3 U: colonoscopy and capsule study unrevealing.  Readmitted 6/22-6/25, 2018 for symptomatic anemia again, got transfused x 4U, EGD with grd I esoph varices  and port hyt gastropathy.  W/u for ? hemolytic anemia to be pursued by hematologist as outpt.  Dr. [name], GI at [facility] following, too---he rec'd onc do bone marrow bx as of Jan 2019  . Iron deficiency anemia due to chronic blood loss 2018/19   GI: transfusions x >20 required; multiple endoscopies and bleeding scans unrevealing. Lysteda and octreotide + monthly iron infusions as of 04/2017. Iron infusions changed to every other month as of 09/2018 hem f/u.  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  . Lung field abnormal finding on examination    Bibasilar L>R mild insp crackles-->x-ray showed mild interstitial changes/fibrosis/scarring.  Changes noted on all CXRs in 2018/2019.  Liver Transplant eval 03/2018->mild restriction on PFTs but no obstruction.  See further details in [place] "pulm fibrosis" section  . Microscopic hematuria    Eval unremarkable by Dr. Eulogio Ditch.  . 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 Obesity   . Pulmonary fibrosis ([facility])    PFTs: restrictive lung dz ([facility] transplant eval 05/2018). 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 Pulm rehab helping as of 11/2018.  [name] Spleen enlarged     Tobacco Use: Social History   Tobacco Use  Smoking Status Former Smoker  . Types: Cigarettes  Smokeless Tobacco Never Used  Tobacco Comment   QUIT I 1983    Labs: Recent Review Flowsheet Data    Labs for ITP Cardiac and Pulmonary Rehab Latest Ref Rng & Units 01/04/2017 01/25/2017 04/05/2017 01/26/2018 05/15/2018   Cholestrol 100 - 199 mg/dL - 162 - - -   LDLCALC 0 - 99 mg/dL - 100(H) - - -   HDL >39 mg/dL - 47 - - -   Trlycerides 0 - 149 mg/dL - 76 - - -   Hemoglobin A1c - 5.5 - 4.6 4.7 4.7      Capillary Blood Glucose: Lab Results  Component Value Date   GLUCAP 154 (H) 05/11/2017   GLUCAP 109 (H) 12/01/2016   GLUCAP 127 (H) 12/01/2016   GLUCAP 122 (H) 09/23/2016   GLUCAP 162 (H) 09/22/2016     Pulmonary Assessment Scores: Pulmonary Assessment Scores    Row Name 10/18/18 1210 10/23/18 1532 10/25/18 1521     ADL UCSD   ADL Phase  Entry  -  Entry   SOB Score total  -  56  -     CAT Score   CAT Score  -  -  11     mMRC Score   mMRC Score  1  -  -     UCSD: Self-administered rating of dyspnea associated with activities of daily living (ADLs) 6-point scale (0 = "not at all" to 5 = "maximal or unable to do  because of breathlessness")  Scoring Scores range from 0 to 120.  Minimally important difference is 5 units  CAT: CAT can identify the health impairment of COPD patients and is better correlated with disease progression.  CAT has a scoring range of zero to 40. The CAT score is classified into four groups of low (less than 10), medium (10 - 20), high (21-30) and very high (31-40) based on the impact level of disease on health status. A CAT score over 10 suggests significant symptoms.  A worsening CAT score could be explained by an exacerbation,  poor medication adherence, poor inhaler technique, or progression of COPD or comorbid conditions.  CAT MCID is 2 points  mMRC: mMRC (Modified Medical Research Council) Dyspnea Scale is used to assess the degree of baseline functional disability in patients of respiratory disease due to dyspnea. No minimal important difference is established. A decrease in score of 1 point or greater is considered a positive change.   Pulmonary Function Assessment: Pulmonary Function Assessment - 10/18/18 0930      Breath   Bilateral Breath Sounds  Clear    Shortness of Breath  Yes;Limiting activity       Exercise Target Goals: Exercise Program Goal: Individual exercise prescription set using results from initial 6 min walk test and THRR while considering  patient's activity barriers and safety.   Exercise Prescription Goal: Initial exercise prescription builds to 30-45 minutes a day of aerobic activity, 2-3 days per week.  Home exercise guidelines will be given to patient during program as part of exercise prescription that the participant will acknowledge.  Activity Barriers & Risk Stratification: Activity Barriers & Cardiac Risk Stratification - 10/18/18 0926      Activity Barriers & Cardiac Risk Stratification   Activity Barriers  Deconditioning;Muscular Weakness;Shortness of Breath       6 Minute Walk: 6 Minute Walk    Row Name 10/18/18 1211          6 Minute Walk   Phase  Initial     Distance  456 feet     Walk Time  2.5 minutes     # of Rest Breaks  1 3.5 minutes due to desaturation. It took a long time for his saturations to rise back up. Still could not maintain saturations on 3L.     MPH  0.86     METS  1.77     RPE  12     Perceived Dyspnea   3     VO2 Peak  4.1     Symptoms  No     Resting HR  64 bpm     Resting BP  106/68     Resting Oxygen Saturation   90 %     Exercise Oxygen Saturation  during 6 min walk  81 %     Max Ex. HR  85 bpm     Max Ex. BP  128/72     2 Minute Post BP  112/66       Interval HR   1 Minute HR  85     2 Minute HR  85     3 Minute HR  74     4 Minute HR  71     5 Minute HR  70     6 Minute HR  76     2 Minute Post HR  64     Interval Heart Rate?  Yes       Interval Oxygen   Interval Oxygen?  Yes     Baseline Oxygen Saturation %  90 %     1 Minute Oxygen Saturation %  85 %     1 Minute Liters of Oxygen  2 L     2 Minute Oxygen Saturation %  81 %     2 Minute Liters of Oxygen  2 L     3 Minute Oxygen Saturation %  83 %     3 Minute Liters of Oxygen  3 L     4 Minute Oxygen Saturation %  86 %     4 Minute Liters of Oxygen  3 L     5 Minute Oxygen Saturation %  88 %     5 Minute Liters of Oxygen  3 L     6 Minute Oxygen Saturation %  86 %     6 Minute Liters of Oxygen  3 L     2 Minute Post Oxygen Saturation %  91 %     2 Minute Post Liters of Oxygen  3 L        Oxygen Initial Assessment: Oxygen Initial Assessment - 10/18/18 1209      Home Oxygen   Home Oxygen Device  Home Concentrator;E-Tanks    Sleep Oxygen Prescription  None    Home Exercise Oxygen Prescription  Continuous    Liters per minute  -1    Home at Rest Exercise Oxygen Prescription  None    Compliance with Home Oxygen Use  Yes      Initial 6 min Walk   Oxygen Used  Continuous    Liters per minute  3      Program Oxygen Prescription   Program Oxygen Prescription  Continuous    Liters per minute  4     Comments  Unable to maintain sats on 3L during walk test, will use 4L for exercise.      Intervention   Short Term Goals  To learn and exhibit compliance with exercise, home and travel O2 prescription;To learn and understand importance of monitoring SPO2 with pulse oximeter and demonstrate accurate use of the pulse oximeter.;To learn and understand importance of maintaining oxygen saturations>88%;To learn and demonstrate proper pursed lip breathing techniques or other breathing techniques.;To learn and demonstrate proper use of respiratory medications    Long  Term Goals  Exhibits compliance with exercise, home and travel O2 prescription;Verbalizes importance of monitoring SPO2 with pulse oximeter and return demonstration;Maintenance of O2 saturations>88%;Exhibits proper breathing techniques, such as pursed lip breathing or other method taught during program session;Compliance with respiratory medication       Oxygen Re-Evaluation: Oxygen Re-Evaluation    Row Name 10/29/18 0748 11/27/18 0722 12/25/18 0735         Program Oxygen Prescription   Program Oxygen Prescription  Continuous  Continuous  Continuous     Liters per minute  _0 Comments  10 L for standing exercise and 8 L for seated exercise  10 L for standing exercise and 8 L for seated exercise  10 L for standing exercise and 8 L for seated exercise       Home Oxygen   Home Oxygen Device  Home Concentrator;E-Tanks  Home Concentrator;E-Tanks  Home Concentrator;E-Tanks     Sleep Oxygen Prescription  None  None  None     Home Exercise Oxygen Prescription  Continuous  Continuous  Continuous     Liters per minute  _1 Home at Rest Exercise Oxygen Prescription  None  None  None     Compliance with Home Oxygen Use  Yes  Yes  Yes       Goals/Expected Outcomes   Short Term Goals  To learn and exhibit compliance with exercise, home and travel O2 prescription;To learn and understand importance of monitoring SPO2 with  pulse oximeter and demonstrate accurate use of the pulse oximeter.;To learn and understand importance of maintaining oxygen saturations>88%;To learn and demonstrate proper pursed lip breathing techniques  or other breathing techniques.;To learn and demonstrate proper use of respiratory medications  To learn and exhibit compliance with exercise, home and travel O2 prescription;To learn and understand importance of monitoring SPO2 with pulse oximeter and demonstrate accurate use of the pulse oximeter.;To learn and understand importance of maintaining oxygen saturations>88%;To learn and demonstrate proper pursed lip breathing techniques or other breathing techniques.;To learn and demonstrate proper use of respiratory medications  To learn and exhibit compliance with exercise, home and travel O2 prescription;To learn and understand importance of monitoring SPO2 with pulse oximeter and demonstrate accurate use of the pulse oximeter.;To learn and understand importance of maintaining oxygen saturations>88%;To learn and demonstrate proper pursed lip breathing techniques or other breathing techniques.;To learn and demonstrate proper use of respiratory medications     Long  Term Goals  Exhibits compliance with exercise, home and travel O2 prescription;Verbalizes importance of monitoring SPO2 with pulse oximeter and return demonstration;Maintenance of O2 saturations>88%;Exhibits proper breathing techniques, such as pursed lip breathing or other method taught during program session;Compliance with respiratory medication  Exhibits compliance with exercise, home and travel O2 prescription;Verbalizes importance of monitoring SPO2 with pulse oximeter and return demonstration;Maintenance of O2 saturations>88%;Exhibits proper breathing techniques, such as pursed lip breathing or other method taught during program session;Compliance with respiratory medication  Exhibits compliance with exercise, home and travel O2  prescription;Verbalizes importance of monitoring SPO2 with pulse oximeter and return demonstration;Maintenance of O2 saturations>88%;Exhibits proper breathing techniques, such as pursed lip breathing or other method taught during program session;Compliance with respiratory medication     Goals/Expected Outcomes  compliance  compliance  compliance        Oxygen Discharge (Final Oxygen Re-Evaluation): Oxygen Re-Evaluation - 12/25/18 0735      Program Oxygen Prescription   Program Oxygen Prescription  Continuous    Liters per minute  10    Comments  10 L for standing exercise and 8 L for seated exercise      Home Oxygen   Home Oxygen Device  Home Concentrator;E-Tanks    Sleep Oxygen Prescription  None    Home Exercise Oxygen Prescription  Continuous    Liters per minute  10    Home at Rest Exercise Oxygen Prescription  None    Compliance with Home Oxygen Use  Yes      Goals/Expected Outcomes   Short Term Goals  To learn and exhibit compliance with exercise, home and travel O2 prescription;To learn and understand importance of monitoring SPO2 with pulse oximeter and demonstrate accurate use of the pulse oximeter.;To learn and understand importance of maintaining oxygen saturations>88%;To learn and demonstrate proper pursed lip breathing techniques or other breathing techniques.;To learn and demonstrate proper use of respiratory medications    Long  Term Goals  Exhibits compliance with exercise, home and travel O2 prescription;Verbalizes importance of monitoring SPO2 with pulse oximeter and return demonstration;Maintenance of O2 saturations>88%;Exhibits proper breathing techniques, such as pursed lip breathing or other method taught during program session;Compliance with respiratory medication    Goals/Expected Outcomes  compliance       Initial Exercise Prescription: Initial Exercise Prescription - 10/18/18 1200      Date of Initial Exercise RX and Referring Provider   Date  10/18/18     Referring Provider  Dr. Anitra Lauth      Oxygen   Oxygen  Continuous    Liters  4      Treadmill   MPH  2.1    Grade  1    Minutes  15  NuStep   Level  2    SPM  80    Minutes  15      Prescription Details   Frequency (times per week)  2    Duration  Progress to 30 minutes of continuous aerobic without signs/symptoms of physical distress      Intensity   THRR 40-80% of Max Heartrate  60-121    Ratings of Perceived Exertion  11-13    Perceived Dyspnea  0-4      Progression   Progression  Continue to progress workloads to maintain intensity without signs/symptoms of physical distress.      Resistance Training   Training Prescription  Yes    Weight  blue bands    Reps  10-15       Perform Capillary Blood Glucose checks as needed.  Exercise Prescription Changes: Exercise Prescription Changes    Row Name 10/30/18 1100 11/06/18 1100 11/13/18 1100 11/27/18 1152 12/11/18 1100     Response to Exercise   Blood Pressure (Admit)  122/60  -  104/60  124/60  118/56   Blood Pressure (Exercise)  130/58  -  144/62  130/72  132/70   Blood Pressure (Exit)  112/64  -  108/68  104/44  110/60   Heart Rate (Admit)  61 bpm  -  60 bpm  57 bpm  66 bpm   Heart Rate (Exercise)  94 bpm  -  91 bpm  92 bpm  91 bpm   Heart Rate (Exit)  68 bpm  -  67 bpm  65 bpm  72 bpm   Oxygen Saturation (Admit)  94 %  -  89 %  100 %  91 %   Oxygen Saturation (Exercise)  88 %  -  89 %  91 %  89 %   Oxygen Saturation (Exit)  92 %  -  92 %  91 %  89 %   Rating of Perceived Exertion (Exercise)  13  -  _0 Perceived Dyspnea (Exercise)  1  -  _1 Duration  Continue with 30 min of aerobic exercise without signs/symptoms of physical distress.  -  Continue with 30 min of aerobic exercise without signs/symptoms of physical distress.  Continue with 30 min of aerobic exercise without signs/symptoms of physical distress.  Continue with 30 min of aerobic exercise without signs/symptoms of physical  distress.   Intensity  Other (comment) 40-80% of HRR  -  -  -  -     Progression   Progression  Continue to progress workloads to maintain intensity without signs/symptoms of physical distress.  -  Continue to progress workloads to maintain intensity without signs/symptoms of physical distress.  Continue to progress workloads to maintain intensity without signs/symptoms of physical distress.  Continue to progress workloads to maintain intensity without signs/symptoms of physical distress.     Resistance Training   Training Prescription  Yes  -  Yes  Yes  Yes   Weight  blue bands  -  blue bands  blue bands  blue bands   Reps  10-15  -  10-15  10-15  10-15   Time  10 Minutes  -  10 Minutes  10 Minutes  10 Minutes     Oxygen   Oxygen  Continuous  -  Continuous  Continuous  Continuous   Liters  6-10  -  10  10  8-10  Treadmill   MPH  2.1  -  2.3  2.4  2.4   Grade  1  -  _0 Minutes  15  -  _1 NuStep   Level  2  -  _2 SPM  80  -  80  80  80   Minutes  15  -  _3 METs  -  -  3  2.9  3     Home Exercise Plan   Plans to continue exercise at  -  Home (comment)  -  -  -   Frequency  -  Add 2 additional days to program exercise sessions.  -  -  -   Initial Home Exercises Provided  -  11/06/18  -  -  -   Row Name 12/25/18 1100             Response to Exercise   Blood Pressure (Admit)  120/54       Blood Pressure (Exercise)  160/60       Blood Pressure (Exit)  102/58       Heart Rate (Admit)  68 bpm       Heart Rate (Exercise)  93 bpm       Heart Rate (Exit)  79 bpm       Oxygen Saturation (Admit)  94 %       Oxygen Saturation (Exercise)  89 %       Oxygen Saturation (Exit)  91 %       Rating of Perceived Exertion (Exercise)  12       Perceived Dyspnea (Exercise)  2       Duration  Continue with 30 min of aerobic exercise without signs/symptoms of physical distress.         Progression   Progression  Continue to progress workloads to  maintain intensity without signs/symptoms of physical distress.         Resistance Training   Training Prescription  Yes       Weight  blue bands       Reps  10-15       Time  10 Minutes         Oxygen   Oxygen  Continuous       Liters  8-10         Treadmill   MPH  2.4       Grade  3       Minutes  15         NuStep   Level  5       SPM  80       Minutes  15       METs  3.4          Exercise Comments: Exercise Comments    Row Name 11/06/18 1157           Exercise Comments  home exercise complete          Exercise Goals and Review: Exercise Goals    Row Name 10/18/18 1217 10/29/18 0749 11/27/18 0723 12/25/18 0735       Exercise Goals   Increase Physical Activity  Yes  Yes  Yes  Yes    Intervention  Provide advice, education, support and counseling about physical activity/exercise needs.;Develop an individualized exercise prescription for aerobic and resistive training  based on initial evaluation findings, risk stratification, comorbidities and participant's personal goals.  Provide advice, education, support and counseling about physical activity/exercise needs.;Develop an individualized exercise prescription for aerobic and resistive training based on initial evaluation findings, risk stratification, comorbidities and participant's personal goals.  Provide advice, education, support and counseling about physical activity/exercise needs.;Develop an individualized exercise prescription for aerobic and resistive training based on initial evaluation findings, risk stratification, comorbidities and participant's personal goals.  Provide advice, education, support and counseling about physical activity/exercise needs.;Develop an individualized exercise prescription for aerobic and resistive training based on initial evaluation findings, risk stratification, comorbidities and participant's personal goals.    Expected Outcomes  Long Term: Exercising regularly at least 3-5 days a  week.;Long Term: Add in home exercise to make exercise part of routine and to increase amount of physical activity.;Short Term: Attend rehab on a regular basis to increase amount of physical activity.  Long Term: Exercising regularly at least 3-5 days a week.;Long Term: Add in home exercise to make exercise part of routine and to increase amount of physical activity.;Short Term: Attend rehab on a regular basis to increase amount of physical activity.  Long Term: Exercising regularly at least 3-5 days a week.;Long Term: Add in home exercise to make exercise part of routine and to increase amount of physical activity.;Short Term: Attend rehab on a regular basis to increase amount of physical activity.  Long Term: Exercising regularly at least 3-5 days a week.;Long Term: Add in home exercise to make exercise part of routine and to increase amount of physical activity.;Short Term: Attend rehab on a regular basis to increase amount of physical activity.    Increase Strength and Stamina  Yes  Yes  Yes  Yes    Intervention  Provide advice, education, support and counseling about physical activity/exercise needs.;Develop an individualized exercise prescription for aerobic and resistive training based on initial evaluation findings, risk stratification, comorbidities and participant's personal goals.  Provide advice, education, support and counseling about physical activity/exercise needs.;Develop an individualized exercise prescription for aerobic and resistive training based on initial evaluation findings, risk stratification, comorbidities and participant's personal goals.  Provide advice, education, support and counseling about physical activity/exercise needs.;Develop an individualized exercise prescription for aerobic and resistive training based on initial evaluation findings, risk stratification, comorbidities and participant's personal goals.  Provide advice, education, support and counseling about physical  activity/exercise needs.;Develop an individualized exercise prescription for aerobic and resistive training based on initial evaluation findings, risk stratification, comorbidities and participant's personal goals.    Expected Outcomes  Short Term: Increase workloads from initial exercise prescription for resistance, speed, and METs.;Short Term: Perform resistance training exercises routinely during rehab and add in resistance training at home;Long Term: Improve cardiorespiratory fitness, muscular endurance and strength as measured by increased METs and functional capacity (6MWT)  Short Term: Increase workloads from initial exercise prescription for resistance, speed, and METs.;Short Term: Perform resistance training exercises routinely during rehab and add in resistance training at home;Long Term: Improve cardiorespiratory fitness, muscular endurance and strength as measured by increased METs and functional capacity (6MWT)  Short Term: Increase workloads from initial exercise prescription for resistance, speed, and METs.;Short Term: Perform resistance training exercises routinely during rehab and add in resistance training at home;Long Term: Improve cardiorespiratory fitness, muscular endurance and strength as measured by increased METs and functional capacity (6MWT)  Short Term: Increase workloads from initial exercise prescription for resistance, speed, and METs.;Short Term: Perform resistance training exercises routinely during rehab and add in resistance training  at home;Long Term: Improve cardiorespiratory fitness, muscular endurance and strength as measured by increased METs and functional capacity (6MWT)    Able to understand and use rate of perceived exertion (RPE) scale  Yes  Yes  Yes  Yes    Intervention  Provide education and explanation on how to use RPE scale  Provide education and explanation on how to use RPE scale  Provide education and explanation on how to use RPE scale  Provide education and  explanation on how to use RPE scale    Expected Outcomes  Short Term: Able to use RPE daily in rehab to express subjective intensity level;Long Term:  Able to use RPE to guide intensity level when exercising independently  Short Term: Able to use RPE daily in rehab to express subjective intensity level;Long Term:  Able to use RPE to guide intensity level when exercising independently  Short Term: Able to use RPE daily in rehab to express subjective intensity level;Long Term:  Able to use RPE to guide intensity level when exercising independently  Short Term: Able to use RPE daily in rehab to express subjective intensity level;Long Term:  Able to use RPE to guide intensity level when exercising independently    Able to understand and use Dyspnea scale  Yes  Yes  Yes  Yes    Intervention  Provide education and explanation on how to use Dyspnea scale  Provide education and explanation on how to use Dyspnea scale  Provide education and explanation on how to use Dyspnea scale  Provide education and explanation on how to use Dyspnea scale    Expected Outcomes  Short Term: Able to use Dyspnea scale daily in rehab to express subjective sense of shortness of breath during exertion;Long Term: Able to use Dyspnea scale to guide intensity level when exercising independently  Short Term: Able to use Dyspnea scale daily in rehab to express subjective sense of shortness of breath during exertion;Long Term: Able to use Dyspnea scale to guide intensity level when exercising independently  Short Term: Able to use Dyspnea scale daily in rehab to express subjective sense of shortness of breath during exertion;Long Term: Able to use Dyspnea scale to guide intensity level when exercising independently  Short Term: Able to use Dyspnea scale daily in rehab to express subjective sense of shortness of breath during exertion;Long Term: Able to use Dyspnea scale to guide intensity level when exercising independently    Knowledge and  understanding of Target Heart Rate Range (THRR)  Yes  Yes  Yes  Yes    Expected Outcomes  Short Term: Able to state/look up THRR;Short Term: Able to use daily as guideline for intensity in rehab;Long Term: Able to use THRR to govern intensity when exercising independently  Short Term: Able to state/look up THRR;Short Term: Able to use daily as guideline for intensity in rehab;Long Term: Able to use THRR to govern intensity when exercising independently  Short Term: Able to state/look up THRR;Short Term: Able to use daily as guideline for intensity in rehab;Long Term: Able to use THRR to govern intensity when exercising independently  Short Term: Able to state/look up THRR;Short Term: Able to use daily as guideline for intensity in rehab;Long Term: Able to use THRR to govern intensity when exercising independently    Understanding of Exercise Prescription  Yes  Yes  Yes  Yes    Intervention  Provide education, explanation, and written materials on patient's individual exercise prescription  Provide education, explanation, and written materials on patient's  individual exercise prescription  Provide education, explanation, and written materials on patient's individual exercise prescription  Provide education, explanation, and written materials on patient's individual exercise prescription    Expected Outcomes  Short Term: Able to explain program exercise prescription;Long Term: Able to explain home exercise prescription to exercise independently  Short Term: Able to explain program exercise prescription;Long Term: Able to explain home exercise prescription to exercise independently  Short Term: Able to explain program exercise prescription;Long Term: Able to explain home exercise prescription to exercise independently  Short Term: Able to explain program exercise prescription;Long Term: Able to explain home exercise prescription to exercise independently       Exercise Goals Re-Evaluation : Exercise Goals  Re-Evaluation    Row Name 10/29/18 0750 11/27/18 0723 12/25/18 0736         Exercise Goal Re-Evaluation   Exercise Goals Review  Increase Physical Activity;Increase Strength and Stamina;Able to understand and use Dyspnea scale;Able to understand and use rate of perceived exertion (RPE) scale;Knowledge and understanding of Target Heart Rate Range (THRR);Understanding of Exercise Prescription  Increase Physical Activity;Increase Strength and Stamina;Able to understand and use Dyspnea scale;Able to understand and use rate of perceived exertion (RPE) scale;Knowledge and understanding of Target Heart Rate Range (THRR);Understanding of Exercise Prescription  Increase Physical Activity;Increase Strength and Stamina;Able to understand and use Dyspnea scale;Able to understand and use rate of perceived exertion (RPE) scale;Knowledge and understanding of Target Heart Rate Range (THRR);Understanding of Exercise Prescription     Comments  Pt has completed 2 exercise sessions. Pt's oxygen saturations have been a concern. Pt has desaturated on 4L, 6L, and 8L while walking on the treadmill. Pt's saturation levels take several minutes to recover even when his liter flow has been increased. Pt now walks on the treadmill at 10 L and on the stepper at 8L. If pt desaturates on 10L, we will have to remove him from the treadmill and put him on another modality. Pt currently exercises at 2.4 METs on the stepper. Will continue to monitor and progress as able.  Pt has completed 10 exercise sessions. We have been able to keep his saturations at 88 and above on the treadmill while exercising on 10L. Pt currently exercises at 3.5 METs on the treadmill and 2.9 METs on the stepper. Pt has recently been exposed to Covid, so he will be out for atleast the next two weeks. Pt shows positive attitude and willing to accept workload increases. Will continue to monitor and progress as able.  Pt has completed 14 exercise sessions. Pt currently  exercises at 3.83 MEts on the treadmill and 3.1 METs on the stepper. Pt is working hard in order to get a liver transplant. We will continue to work with him so he can get this transplant. Pt is accepting of workload increases. Will continue to monitor and progress as able.     Expected Outcomes  Through exercise at rehab and at home, the patient will decrease shortness of breath with daily activities and feel confident in carrying out an exercise regime at home.  Through exercise at rehab and at home, the patient will decrease shortness of breath with daily activities and feel confident in carrying out an exercise regime at home.  Through exercise at rehab and at home, the patient will decrease shortness of breath with daily activities and feel confident in carrying out an exercise regime at home.        Discharge Exercise Prescription (Final Exercise Prescription Changes): Exercise Prescription Changes -  12/25/18 1100      Response to Exercise   Blood Pressure (Admit)  120/54    Blood Pressure (Exercise)  160/60    Blood Pressure (Exit)  102/58    Heart Rate (Admit)  68 bpm    Heart Rate (Exercise)  93 bpm    Heart Rate (Exit)  79 bpm    Oxygen Saturation (Admit)  94 %    Oxygen Saturation (Exercise)  89 %    Oxygen Saturation (Exit)  91 %    Rating of Perceived Exertion (Exercise)  12    Perceived Dyspnea (Exercise)  2    Duration  Continue with 30 min of aerobic exercise without signs/symptoms of physical distress.      Progression   Progression  Continue to progress workloads to maintain intensity without signs/symptoms of physical distress.      Resistance Training   Training Prescription  Yes    Weight  blue bands    Reps  10-15    Time  10 Minutes      Oxygen   Oxygen  Continuous    Liters  8-10      Treadmill   MPH  2.4    Grade  3    Minutes  15      NuStep   Level  5    SPM  80    Minutes  15    METs  3.4       Nutrition:  Target Goals: Understanding of  nutrition guidelines, daily intake of sodium <1552m, cholesterol <2054m calories 30% from fat and 7% or less from saturated fats, daily to have 5 or more servings of fruits and vegetables.  Biometrics: Pre Biometrics - 10/18/18 0926      Pre Biometrics   Grip Strength  32 kg        Nutrition Therapy Plan and Nutrition Goals:   Nutrition Assessments:   Nutrition Goals Re-Evaluation:   Nutrition Goals Discharge (Final Nutrition Goals Re-Evaluation):   Psychosocial: Target Goals: Acknowledge presence or absence of significant depression and/or stress, maximize coping skills, provide positive support system. Participant is able to verbalize types and ability to use techniques and skills needed for reducing stress and depression.  Initial Review & Psychosocial Screening: Initial Psych Review & Screening - 10/18/18 0945      Initial Review   Current issues with  None Identified      Family Dynamics   Good Support System?  Yes   Being [facility]   Barriers   Psychosocial barriers to participate in program  There are no identifiable barriers or psychosocial needs.      Screening Interventions   Interventions  Encouraged to exercise       Quality of Life Scores:  Scores of 19 and below usually indicate a poorer quality of life in these areas.  A difference of  2-3 points is a clinically meaningful difference.  A difference of 2-3 points in the total score of the Quality of Life Index has been associated with significant improvement in overall quality of life, self-image, physical symptoms, and general health in studies assessing change in quality of life.  PHQ-9: Recent Review Flowsheet Data    [facility] 10/18/2018 10/18/2018 01/26/2018 04/05/2017 01/04/2017   Decreased Interest - 0 0 2 0   Down, Depressed, Hopeless 0 0 0 0 0   PHQ - 2 Score 0 0 0 2 0   Altered  sleeping 0 - 1 0 -   Tired, decreased energy 0 - 1 1 -   Change  in appetite 0 - 0 1 -   Feeling bad or failure about yourself  0 - 0 0 -   Trouble concentrating 0 - 0 0 -   Moving slowly or fidgety/restless 0 - 0 0 -   Suicidal thoughts 0 - 0 0 -   PHQ-9 Score 0 - 2 4 -   Difficult doing work/chores Not difficult at all - Not difficult at all Not difficult at all -     Interpretation of Total Score  Total Score Depression Severity:  1-4 = Minimal depression, 5-9 = Mild depression, 10-14 = Moderate depression, 15-19 = Moderately severe depression, 20-27 = Severe depression   Psychosocial Evaluation and Intervention: Psychosocial Evaluation - 10/18/18 0946      Psychosocial Evaluation & Interventions   Interventions  Encouraged to exercise with the program and follow exercise prescription    Comments  Being evaluated at [facility] for liver transplant    Expected Outcomes  No barriers to participation in pulmonary rehab    Continue Psychosocial Services   No Follow up required       Psychosocial Re-Evaluation: Psychosocial Re-Evaluation    Row Name 10/18/18 0936 10/18/18 0947 10/29/18 1238 10/29/18 1246 11/28/18 1022     Psychosocial Re-Evaluation   Current issues with  None Identified  None Identified  None Identified  -  None Identified   Comments  -  -  No barriers to participation in pulmonary rehab  -  no barriers identified   Expected Outcomes  -  -  Continues to be barrier free while in program  -  will continue to have no barriers or psychosocial concerns while in pulmonary rehab   Interventions  Encouraged to attend Pulmonary Rehabilitation for the exercise  Encouraged to attend Pulmonary Rehabilitation for the exercise  -  Encouraged to attend Pulmonary Rehabilitation for the exercise  Encouraged to attend Pulmonary Rehabilitation for the exercise   Continue Psychosocial Services   No Follow up required  No Follow up required  No Follow up required  -  No Follow up required   [place] Name 12/24/18 0946             Psychosocial Re-Evaluation    Current issues with  None Identified       Comments  Braydn remains free of barriers to participation in pulmonary rehab and has no psychosocial concerns at this point.       Expected Outcomes  That Sora will continue to have a positive attitude and no psychosocial concerns.       Interventions  Encouraged to attend Pulmonary Rehabilitation for the exercise       Continue Psychosocial Services   No Follow up required          Psychosocial Discharge (Final Psychosocial Re-Evaluation): Psychosocial Re-Evaluation - 12/24/18 0946      Psychosocial Re-Evaluation   Current issues with  None Identified    Comments  Pao remains free of barriers to participation in pulmonary rehab and has no psychosocial concerns at this point.    Expected Outcomes  That Tavish will continue to have a positive attitude and no psychosocial concerns.    Interventions  Encouraged to attend Pulmonary Rehabilitation for the exercise    Continue Psychosocial Services   No Follow up required       Education: Education Goals: Education classes will be provided  on a weekly basis, covering required topics. Participant will state understanding/return demonstration of topics presented.  Learning Barriers/Preferences: Learning Barriers/Preferences - 10/18/18 1515      Learning Barriers/Preferences   Learning Barriers  None       Education Topics: Risk Factor Reduction:  -Group instruction that is supported by a PowerPoint presentation. Instructor discusses the definition of a risk factor, different risk factors for pulmonary disease, and how the heart and lungs work together.     Nutrition for Pulmonary Patient:  -Group instruction provided by PowerPoint slides, verbal discussion, and written materials to support subject matter. The instructor gives an explanation and review of healthy diet recommendations, which includes a discussion on weight management, recommendations for fruit and vegetable consumption, as  well as protein, fluid, caffeine, fiber, sodium, sugar, and alcohol. Tips for eating when patients are short of breath are discussed.   Pursed Lip Breathing:  -Group instruction that is supported by demonstration and informational handouts. Instructor discusses the benefits of pursed lip and diaphragmatic breathing and detailed demonstration on how to preform both.     Oxygen Safety:  -Group instruction provided by PowerPoint, verbal discussion, and written material to support subject matter. There is an overview of "What is Oxygen" and "Why do we need it".  Instructor also reviews how to create a safe environment for oxygen use, the importance of using oxygen as prescribed, and the risks of noncompliance. There is a brief discussion on traveling with oxygen and resources the patient may utilize.   Oxygen Equipment:  -Group instruction provided by [facility] Staff utilizing handouts, written materials, and equipment demonstrations.   Signs and Symptoms:  -Group instruction provided by written material and verbal discussion to support subject matter. Warning signs and symptoms of infection, stroke, and heart attack are reviewed and when to call the physician/911 reinforced. Tips for preventing the spread of infection discussed.   Advanced Directives:  -Group instruction provided by verbal instruction and written material to support subject matter. Instructor reviews Advanced Directive laws and proper instruction for filling out document.   Pulmonary Video:  -Group video education that reviews the importance of medication and oxygen compliance, exercise, good nutrition, pulmonary hygiene, and pursed lip and diaphragmatic breathing for the pulmonary patient.   Exercise for the Pulmonary Patient:  -Group instruction that is supported by a PowerPoint presentation. Instructor discusses benefits of exercise, core components of exercise, frequency, duration, and intensity of an exercise routine,  importance of utilizing pulse oximetry during exercise, safety while exercising, and options of places to exercise outside of rehab.     Pulmonary Medications:  -Verbally interactive group education provided by instructor with focus on inhaled medications and proper administration.   Anatomy and Physiology of the Respiratory System and Intimacy:  -Group instruction provided by PowerPoint, verbal discussion, and written material to support subject matter. Instructor reviews respiratory cycle and anatomical components of the respiratory system and their functions. Instructor also reviews differences in obstructive and restrictive respiratory diseases with examples of each. Intimacy, Sex, and Sexuality differences are reviewed with a discussion on how relationships can change when diagnosed with pulmonary disease. Common sexual concerns are reviewed.   MD DAY -A group question and answer session with a medical doctor that allows participants to ask questions that relate to their pulmonary disease state.   OTHER EDUCATION -Group or individual verbal, written, or video instructions that support the educational goals of the pulmonary rehab program.   Holiday Eating Survival Tips:  -Group instruction provided  by PowerPoint slides, verbal discussion, and written materials to support subject matter. The instructor gives patients tips, tricks, and techniques to help them not only survive but enjoy the holidays despite the onslaught of food that accompanies the holidays.   PULMONARY REHAB OTHER RESPIRATORY from 12/25/2018 in [place]  Date  12/25/18  Educator  -- [Handout]      Knowledge Questionnaire Score: Knowledge Questionnaire Score - 10/23/18 1532      Knowledge Questionnaire Score   Pre Score  16/18       Core Components/Risk Factors/Patient Goals at Admission: Personal Goals and Risk Factors at Admission - 10/18/18 0937      Core Components/Risk  Factors/Patient Goals on Admission   Improve shortness of breath with ADL's  Yes       Core Components/Risk Factors/Patient Goals Review:  Goals and Risk Factor Review    Row Name 10/18/18 6948 10/29/18 1239 11/28/18 1023 12/24/18 0949       Core Components/Risk Factors/Patient Goals Review   Personal Goals Review  Increase knowledge of respiratory medications and ability to use respiratory devices properly.;Improve shortness of breath with ADL's;Develop more efficient breathing techniques such as purse lipped breathing and diaphragmatic breathing and practicing self-pacing with activity.  Increase knowledge of respiratory medications and ability to use respiratory devices properly.;Improve shortness of breath with ADL's;Develop more efficient breathing techniques such as purse lipped breathing and diaphragmatic breathing and practicing self-pacing with activity.  Increase knowledge of respiratory medications and ability to use respiratory devices properly.;Improve shortness of breath with ADL's;Develop more efficient breathing techniques such as purse lipped breathing and diaphragmatic breathing and practicing self-pacing with activity.  Develop more efficient breathing techniques such as purse lipped breathing and diaphragmatic breathing and practicing self-pacing with activity.;Increase knowledge of respiratory medications and ability to use respiratory devices properly.;Improve shortness of breath with ADL's    Review  -  Just started program, has attended 2 exercise sessions, too early to meet goals for program.  Is requiring 10 L of oxygen to walk on the treadmill and 8 L on the nustep.  Is being evaluated for a liver transplant at [facility].  Is requiring 10 L of oxygen whilw walking on the treadmill and 8 L while on nustep, has done very well, always present for class, treadmill 2.4 mph with 2% grade, level 4 on nustep  Jaiceon is nearing graduation from the program and has progressed well.  He is being  evaluated at [facility] for a liver transplant, he is walking on the treadmill @ 2.4 mph and a 3% grade. He is level 4 on the nustep.    Expected Outcomes  -  See admission goals  Will continue to increase workloads as tolerated.  That he will continue to exercise on his own after graduation with the guidelines he has been taught.       Core Components/Risk Factors/Patient Goals at Discharge (Final Review):  Goals and Risk Factor Review - 12/24/18 0949      Core Components/Risk Factors/Patient Goals Review   Personal Goals Review  Develop more efficient breathing techniques such as purse lipped breathing and diaphragmatic breathing and practicing self-pacing with activity.;Increase knowledge of respiratory medications and ability to use respiratory devices properly.;Improve shortness of breath with ADL's    Review  Michaelanthony is nearing graduation from the program and has progressed well.  He is being evaluated at [facility] for a liver transplant, he is walking on the treadmill @ 2.4 mph and a  3% grade. He is level 4 on the nustep.    Expected Outcomes  That he will continue to exercise on his own after graduation with the guidelines he has been taught.       ITP Comments:   Comments: ITP REVIEW Pt is making expected progress toward pulmonary rehab goals after completing 15 sessions. Recommend continued exercise, life style modification, education, and utilization of breathing techniques to increase stamina and strength and decrease shortness of breath with exertion.

## 2018-12-26 DIAGNOSIS — Z01818 Encounter for other preprocedural examination: Secondary | ICD-10-CM | POA: Diagnosis not present

## 2018-12-26 LAB — BASIC METABOLIC PANEL
BUN: 9 (ref 4–21)
CO2: 16 (ref 13–22)
Chloride: 102 (ref 99–108)
Creatinine: 1.7 — AB (ref 0.6–1.3)
Glucose: 147
Potassium: 4.4 (ref 3.4–5.3)
Sodium: 139 (ref 137–147)

## 2018-12-26 LAB — COMPREHENSIVE METABOLIC PANEL
GFR calc Af Amer: 70
GFR calc non Af Amer: 61

## 2018-12-26 LAB — HEPATIC FUNCTION PANEL: Bilirubin, Total: 9.1

## 2018-12-26 LAB — POCT INR: INR: 1.7 — AB (ref 0.9–1.1)

## 2018-12-27 ENCOUNTER — Other Ambulatory Visit: Payer: Self-pay

## 2018-12-27 ENCOUNTER — Encounter (HOSPITAL_COMMUNITY)
Admission: RE | Admit: 2018-12-27 | Discharge: 2018-12-27 | Disposition: A | Discharge status: Home / Self Care | Payer: 59 | Source: Ambulatory Visit | Attending: Family Medicine | Admitting: Family Medicine

## 2018-12-27 DIAGNOSIS — J8410 Pulmonary fibrosis, unspecified: Secondary | ICD-10-CM

## 2018-12-27 MED FILL — GENERLAC 10 GM/15 ML SOLN: 10 | 30 days supply | Qty: 2700 | Fill #2

## 2018-12-27 NOTE — Progress Notes (Signed)
Daily Session Note  Patient Details  Name: William [name] MRN: [id number] Date of Birth: 05-06-1950 Referring Provider:     Pulmonary Rehab Walk Test from 10/18/2018 in [place]  Referring Provider  Dr. Anitra Lauth      Encounter Date: 12/27/2018  Check In: Session Check In - 12/27/18 0955      Check-In   Supervising physician immediately available to respond to emergencies  [facility] immediately available    Physician(s)  Dr. Wyline Copas    Location  [facility]    Staff Present  Rosebud Poles, RN, Bjorn Loser, MS, Exercise Physiologist;Lisa [name], RN    Virtual Visit  No    Medication changes reported      No    Fall or balance concerns reported     No    Tobacco Cessation  No Change    Warm-up and Cool-down  Performed as group-led instruction    Resistance Training Performed  Yes    VAD Patient?  No    PAD/SET Patient?  No      Pain Assessment   Currently in Pain?  No/denies    Multiple Pain Sites  No       Capillary Blood Glucose: No results found for this or any previous visit (from the past 24 hour(s)).    Social History   Tobacco Use  Smoking Status Former Smoker  . Types: Cigarettes  Smokeless Tobacco Never Used  Tobacco Comment   QUIT I 1983    Goals Met:  Proper associated with RPD/PD & O2 Sat Exercise tolerated well Strength training completed today  Goals Unmet:  Not Applicable  Comments: Service time is from 0945 to 55    Dr. Rush Farmer is Medical Director for Pulmonary Rehab at [facility].

## 2018-12-30 DIAGNOSIS — J181 Lobar pneumonia, unspecified organism: Secondary | ICD-10-CM | POA: Diagnosis not present

## 2018-12-31 ENCOUNTER — Encounter: Payer: Self-pay | Admitting: Family Medicine

## 2018-12-31 DIAGNOSIS — Z01818 Encounter for other preprocedural examination: Secondary | ICD-10-CM

## 2018-12-31 DIAGNOSIS — K7460 Unspecified cirrhosis of liver: Secondary | ICD-10-CM

## 2018-12-31 NOTE — Telephone Encounter (Signed)
Pt had visit on 11/15/18 for routine follow up. Please advise if this can be done, thanks.

## 2019-01-01 ENCOUNTER — Encounter (HOSPITAL_COMMUNITY)
Admission: RE | Admit: 2019-01-01 | Discharge: 2019-01-01 | Disposition: A | Discharge status: Home / Self Care | Payer: 59 | Source: Ambulatory Visit | Attending: Family Medicine | Admitting: Family Medicine

## 2019-01-01 ENCOUNTER — Other Ambulatory Visit: Payer: Self-pay

## 2019-01-01 DIAGNOSIS — J8410 Pulmonary fibrosis, unspecified: Secondary | ICD-10-CM | POA: Diagnosis not present

## 2019-01-01 NOTE — Telephone Encounter (Signed)
OK, bone density test ordered for [facility].

## 2019-01-01 NOTE — Progress Notes (Signed)
Daily Session Note  Patient Details  Name: William [name] MRN: [id number] Date of Birth: 1950/03/17 Referring Provider:     Pulmonary Rehab Walk Test from 10/18/2018 in [place]  Referring Provider  Dr. Anitra Lauth      Encounter Date: 01/01/2019  Check In: Session Check In - 01/01/19 0956      Check-In   Supervising physician immediately available to respond to emergencies  [facility] immediately available    Physician(s)  Dr. Wyline Copas    Location  [facility]    Staff Present  Rosebud Poles, RN, Bjorn Loser, MS, Exercise Physiologist;Raegen Tarpley [name], RN    Virtual Visit  No    Medication changes reported      No    Fall or balance concerns reported     No    Tobacco Cessation  No Change    Warm-up and Cool-down  Performed as group-led instruction    Resistance Training Performed  Yes    VAD Patient?  No    PAD/SET Patient?  No      Pain Assessment   Currently in Pain?  No/denies    Multiple Pain Sites  No       Capillary Blood Glucose: No results found for this or any previous visit (from the past 24 hour(s)).    Social History   Tobacco Use  Smoking Status Former Smoker  . Types: Cigarettes  Smokeless Tobacco Never Used  Tobacco Comment   QUIT I 1983    Goals Met:  Personal goals reviewed No report of cardiac concerns or symptoms Strength training completed today  Goals Unmet:  Not Applicable  Comments: Service time is from 0950 to 1035    Dr. Rush Farmer is Medical Director for Pulmonary Rehab at [facility].

## 2019-01-07 ENCOUNTER — Other Ambulatory Visit: Payer: 59

## 2019-01-07 ENCOUNTER — Ambulatory Visit: Payer: 59 | Admitting: Hematology

## 2019-01-07 ENCOUNTER — Ambulatory Visit: Payer: 59

## 2019-01-08 ENCOUNTER — Encounter (HOSPITAL_COMMUNITY)
Admission: RE | Admit: 2019-01-08 | Discharge: 2019-01-08 | Disposition: A | Discharge status: Home / Self Care | Payer: 59 | Source: Ambulatory Visit | Attending: Family Medicine | Admitting: Family Medicine

## 2019-01-08 ENCOUNTER — Other Ambulatory Visit: Payer: Self-pay

## 2019-01-08 VITALS — Wt 209.9 lb

## 2019-01-08 DIAGNOSIS — J8410 Pulmonary fibrosis, unspecified: Secondary | ICD-10-CM | POA: Insufficient documentation

## 2019-01-08 NOTE — Addendum Note (Signed)
Encounter addended by: Deon Pilling, RN on: 01/08/2019 12:08 PM  Actions taken: Clinical Note Signed, Delete clinical note

## 2019-01-08 NOTE — Progress Notes (Signed)
Daily Session Note  Patient Details  Name: William [name] MRN: [id number] Date of Birth: November 06, 1950 Referring Provider:     Pulmonary Rehab Walk Test from 10/18/2018 in [place]  Referring Provider  Dr. Anitra Lauth      Encounter Date: 01/08/2019  Check In: Session Check In - 01/08/19 1004      Check-In   Supervising physician immediately available to respond to emergencies  [facility] immediately available    Physician(s)  Dr. Eliseo Squires    Location  [facility]    Staff Present  Rosebud Poles, RN, Bjorn Loser, MS, Exercise Physiologist;Casidy Alberta [name], RN    Virtual Visit  No    Medication changes reported      No    Fall or balance concerns reported     No    Tobacco Cessation  No Change    Warm-up and Cool-down  Performed as group-led instruction    Resistance Training Performed  Yes    VAD Patient?  No      Pain Assessment   Currently in Pain?  No/denies    Multiple Pain Sites  No       Capillary Blood Glucose: No results found for this or any previous visit (from the past 24 hour(s)).    Social History   Tobacco Use  Smoking Status Former Smoker  . Types: Cigarettes  Smokeless Tobacco Never Used  Tobacco Comment   QUIT I 1983    Goals Met:  Exercise tolerated well No report of cardiac concerns or symptoms Strength training completed today  Goals Unmet:  Not Applicable  Comments: Service time is from 0945 to 1030    Dr. Rush Farmer is Medical Director for Pulmonary Rehab at [facility].

## 2019-01-08 NOTE — Progress Notes (Deleted)
Daily Session Note  Patient Details  Name: William [name] MRN: [id number] Date of Birth: 01-Jul-1950 Referring Provider:     Pulmonary Rehab Walk Test from 10/18/2018 in [place]  Referring Provider  Dr. Anitra Lauth      Encounter Date: 12/18/2018  Check In:   Capillary Blood Glucose: No results found for this or any previous visit (from the past 24 hour(s)).  Exercise Prescription Changes - 01/08/19 1000      Response to Exercise   Blood Pressure (Admit)  110/50    Blood Pressure (Exercise)  144/80    Blood Pressure (Exit)  118/66    Heart Rate (Admit)  63 bpm    Heart Rate (Exercise)  83 bpm    Heart Rate (Exit)  73 bpm    Oxygen Saturation (Admit)  93 %    Oxygen Saturation (Exercise)  90 %    Oxygen Saturation (Exit)  90 %    Rating of Perceived Exertion (Exercise)  13    Perceived Dyspnea (Exercise)  2    Duration  Continue with 30 min of aerobic exercise without signs/symptoms of physical distress.      Progression   Progression  Continue to progress workloads to maintain intensity without signs/symptoms of physical distress.      Resistance Training   Training Prescription  Yes    Weight  blue bands    Reps  10-15    Time  10 Minutes      Oxygen   Oxygen  Continuous    Liters  8-10      Treadmill   MPH  2.6    Grade  3    Minutes  15      NuStep   Level  5    SPM  80    Minutes  15    METs  3.4       Social History   Tobacco Use  Smoking Status Former Smoker  . Types: Cigarettes  Smokeless Tobacco Never Used  Tobacco Comment   QUIT I 1983    Goals Met:  Exercise tolerated well No report of cardiac concerns or symptoms Strength training completed today  Goals Unmet:  Not Applicable  Comments: Service time is from 0945 to 1030    Dr. Rush Farmer is Medical Director for Pulmonary Rehab at [facility].

## 2019-01-08 NOTE — Addendum Note (Signed)
Encounter addended by: Deon Pilling, RN on: 01/08/2019 10:59 AM  Actions taken: Vitals modified, Flowsheet data copied forward, Flowsheet accepted, Clinical Note Signed

## 2019-01-09 ENCOUNTER — Other Ambulatory Visit: Payer: Self-pay | Admitting: *Deleted

## 2019-01-09 DIAGNOSIS — D500 Iron deficiency anemia secondary to blood loss (chronic): Secondary | ICD-10-CM

## 2019-01-10 ENCOUNTER — Other Ambulatory Visit: Payer: Self-pay

## 2019-01-10 ENCOUNTER — Inpatient Hospital Stay (HOSPITAL_BASED_OUTPATIENT_CLINIC_OR_DEPARTMENT_OTHER): Payer: 59 | Admitting: Hematology

## 2019-01-10 ENCOUNTER — Inpatient Hospital Stay: Payer: 59 | Attending: Hematology

## 2019-01-10 ENCOUNTER — Inpatient Hospital Stay: Payer: 59

## 2019-01-10 VITALS — BP 127/58 | HR 53 | Temp 97.8°F | Resp 20

## 2019-01-10 VITALS — BP 120/62 | HR 60 | Temp 98.3°F | Resp 18 | Ht 69.0 in | Wt 210.5 lb

## 2019-01-10 DIAGNOSIS — K922 Gastrointestinal hemorrhage, unspecified: Secondary | ICD-10-CM | POA: Insufficient documentation

## 2019-01-10 DIAGNOSIS — D500 Iron deficiency anemia secondary to blood loss (chronic): Secondary | ICD-10-CM

## 2019-01-10 LAB — CMP (CANCER CENTER ONLY)
ALT: 29 U/L (ref 0–44)
AST: 50 U/L — ABNORMAL HIGH (ref 15–41)
Albumin: 3 g/dL — ABNORMAL LOW (ref 3.5–5.0)
Alkaline Phosphatase: 124 U/L (ref 38–126)
Anion gap: 9 (ref 5–15)
BUN: 15 mg/dL (ref 8–23)
CO2: 22 mmol/L (ref 22–32)
Calcium: 8.4 mg/dL — ABNORMAL LOW (ref 8.9–10.3)
Chloride: 105 mmol/L (ref 98–111)
Creatinine: 1.59 mg/dL — ABNORMAL HIGH (ref 0.61–1.24)
GFR, Est AFR Am: 51 mL/min — ABNORMAL LOW (ref >60)
GFR, Estimated: 44 mL/min — ABNORMAL LOW (ref >60)
Glucose, Bld: 154 mg/dL — ABNORMAL HIGH (ref 70–99)
Potassium: 3.9 mmol/L (ref 3.5–5.1)
Sodium: 136 mmol/L (ref 135–145)
Total Bilirubin: 7.8 mg/dL (ref 0.3–1.2)
Total Protein: 6.5 g/dL (ref 6.5–8.1)

## 2019-01-10 LAB — CBC WITH DIFFERENTIAL (CANCER CENTER ONLY)
Abs Immature Granulocytes: 0.02 10*3/uL (ref 0.00–0.07)
Basophils Absolute: 0.1 10*3/uL (ref 0.0–0.1)
Basophils Relative: 1 %
Eosinophils Absolute: 0.7 10*3/uL — ABNORMAL HIGH (ref 0.0–0.5)
Eosinophils Relative: 12 %
HCT: 39.8 % (ref 39.0–52.0)
Hemoglobin: 13.3 g/dL (ref 13.0–17.0)
Immature Granulocytes: 0 %
Lymphocytes Relative: 14 %
Lymphs Abs: 0.8 10*3/uL (ref 0.7–4.0)
MCH: 32.9 pg (ref 26.0–34.0)
MCHC: 33.4 g/dL (ref 30.0–36.0)
MCV: 98.5 fL (ref 80.0–100.0)
Monocytes Absolute: 0.9 10*3/uL (ref 0.1–1.0)
Monocytes Relative: 15 %
Neutro Abs: 3.4 10*3/uL (ref 1.7–7.7)
Neutrophils Relative %: 58 %
Platelet Count: 70 10*3/uL — ABNORMAL LOW (ref 150–400)
RBC: 4.04 MIL/uL — ABNORMAL LOW (ref 4.22–5.81)
RDW: 17.5 % — ABNORMAL HIGH (ref 11.5–15.5)
WBC Count: 5.9 10*3/uL (ref 4.0–10.5)
nRBC: 0 % (ref 0.0–0.2)

## 2019-01-10 LAB — IRON AND TIBC
Iron: 66 ug/dL (ref 42–163)
Saturation Ratios: 24 % (ref 20–55)
TIBC: 276 ug/dL (ref 202–409)
UIBC: 210 ug/dL (ref 117–376)

## 2019-01-10 LAB — FERRITIN: Ferritin: 302 ng/mL (ref 24–336)

## 2019-01-10 MED ORDER — OCTREOTIDE ACETATE 30 MG IM KIT
30.0000 mg | PACK | Freq: Once | INTRAMUSCULAR | Status: AC
  Administered 2019-01-10: 30 mg via INTRAMUSCULAR

## 2019-01-10 MED ORDER — OCTREOTIDE ACETATE 30 MG IM KIT
PACK | INTRAMUSCULAR | Status: AC
  Filled 2019-01-10: qty 1

## 2019-01-10 MED ORDER — SODIUM CHLORIDE 0.9 % IV SOLN
INTRAVENOUS | Status: DC
  Administered 2019-01-10: 13:00:00 via INTRAVENOUS
  Filled 2019-01-10: qty 250

## 2019-01-10 MED ORDER — SODIUM CHLORIDE 0.9 % IV SOLN
750.0000 mg | Freq: Once | INTRAVENOUS | Status: AC
  Administered 2019-01-10: 750 mg via INTRAVENOUS
  Filled 2019-01-10: qty 15

## 2019-01-10 NOTE — Progress Notes (Signed)
HEMATOLOGY/ONCOLOGY CLINIC NOTE  Date of Service: 01/10/19  Patient Care Team: Tammi Sou, MD as PCP - General (Family Medicine) Troy Sine, MD as PCP - Cardiology (Cardiology) Franchot Gallo, MD as Consulting Physician (Urology) Troy Sine, MD as Consulting Physician (Cardiology) Brunetta Genera, MD as Consulting Physician (Hematology) [name], Gaspar Skeeters, MD as Consulting Physician (Gastroenterology) Carol Ada, MD as Consulting Physician (Gastroenterology) Rege, Dwain Sarna, MD as Consulting Physician (General Surgery) Lovenia Shuck, MD as Consulting Physician (Cardiothoracic Surgery) Blazing, Venia Carbon, MD as Consulting Physician (Cardiology) Teena Irani, MD as Consulting Physician (Internal Medicine)  CHIEF COMPLAINTS/PURPOSE OF CONSULTATION:   F/u for anemia and ongoing GI bleeding.  HISTORY OF PRESENTING ILLNESS:   William [name] is a wonderful 68 y.o. male who has been referred to [place] by Dr .Anitra Lauth, Adrian Blackwater, MD for evaluation and management of Anemia.  Patient has a history of extensive medical comorbidities including liver cirrhosis related to [facility] with portal hypertension, esophageal varices and portal hypertensive gastropathy and splenomegaly, iron deficiency anemia, obesity, diabetes, AAA.  Patient was admitted in April 2018 with acute blood loss anemia and required transfusion of multiple units of PRBCs with iron profile suggestive of iron deficiency. No overt evidence of hemolysis noted. He had an EGD and colonoscopy that did not show any overt bleeding. Capsule endoscopy was unrevealing but the capsule past and only 27 minutes.  He follows with Dr. [name] who is his gastric oncologist.  Patient was again readmitted in June 2018 with symptomatically anemia and hemoglobin of 6.3. He underwent 4 units of PRBC transfusions again and was sent out on PPI and iron supplementation with the plan to follow-up with Dr. [name].  He was also  given hematology referral. He had a repeat ultrasound of the abdomen on 07/29/2016 which showed significant increase in splenomegaly in 2 months suggesting significant portal hypertension versus some element of splenic sequestration.  He continues to note intermittent black stools.  Hemoglobin is stable and improved today and is up to 11.1. Patient notes he feels better after his transfusions. Has not noted lower GI bleeding at this time. We discuss any other workup to rule out less likely other possibilities of his anemia.  INTERVAL HISTORY  William [name] is here for a scheduled follow-up of his anemia that is primarily related to ongoing issues with GI bleeding and has not been controllable by multiple GI interventions. The patient's last visit with [place] was on 09/17/2018. The pt reports that he is doing well overall.  The pt reports he is doing well. He has an appointment on 02/11/2018 to discuss liver transplant.   [facility] Two days a week for one 1 hour to help strengthen lungs.  He notices dark stools every two or three weeks for a few days.  He is using oxygen as needed.  Lab results today (01/10/19) of CBC w/diff and CMP is as follows: all values are WNL except for RBC at 4.04, RDW at 17.5, Platelet Count at 70, Eosinophils Absolute at 0.7, Glucose Bld at 154, Creatinine at 1.59, Calcium at 8.4, albumin at 3.0, AST at 50, Total bilirubin at 7.8, GFR Est Non Af Am at 44, GFR Est AFR Am at 51,  PENDING Ferritin.  On review of systems, pt reports occasional  dark stools and denies skin rashes, new symptoms, abdominal pain, leg swelling and any other symptoms.    MEDICAL HISTORY:  Past Medical History:  Diagnosis Date   AAA (abdominal aortic aneurysm) ([place]) 03/2014   repaired 2020   Bilateral renal cysts    simple (03/2014 MRI)   CAD (coronary artery disease)    Cholelithiases 2018   asymptomatic   Chronic diastolic heart failure ([place])     Cirrhosis ([place]) 05/2016   secondary to NASH; most recent u/s abd 07/2016 showed splenomegaly.  Hx of portal HTN changes with mild ascites and splenomegaly.  Esoph varices, elev bili, elev INR, low alb, hx of hep enceph: On lactulose as of 11/2018   Diabetes mellitus with complication ([place]) 74/0814   A1c 6.8%   History of blood transfusion 2018 X 4 dates   "low blood" (09/21/2016)   Hyperlipemia, mixed    elevated LFTs when on statins.     Hypertension    Cr bump 04/01/16 so I changed benicar-hct to benicar plain and added amlodipine 5 mg.   Iron deficiency anemia 05/2016   Acute blood loss anemia: hospitalized, required transfusion x 3 U: colonoscopy and capsule study unrevealing.  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  W/u for ? hemolytic anemia to be pursued by hematologist as outpt.  Dr. [name], GI at [facility] following, too---he rec'd onc do bone marrow bx as of Jan 2019   Iron deficiency anemia due to chronic blood loss 2018/19   GI: transfusions x >20 required; multiple endoscopies and bleeding scans unrevealing. Lysteda and octreotide + monthly iron infusions as of 04/2017. Iron infusions changed to every other month as of 09/2018 hem f/u.   Liver failure ([place]) 2020   NASH cirrhosis-->pt began April 2020 undergoing long eval for placement on liver transplant list at [facility].   Lung field abnormal finding on examination    Bibasilar L>R mild insp crackles-->x-ray showed mild interstitial changes/fibrosis/scarring.  Changes noted on all CXRs in 2018/2019.  Liver Transplant eval 03/2018->mild restriction on PFTs but no obstruction.  See further details in PMH "pulm fibrosis" section   Microscopic hematuria    Eval unremarkable by Dr. Eulogio Ditch.   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 Pulm rehab helping as of 11/2018.   Spleen enlarged     SURGICAL HISTORY: Past Surgical History:  Procedure Laterality Date   ABDOMINAL AORTIC ANEURYSM REPAIR  08/23/2018   [facility]   CARDIAC CATHETERIZATION     CARDIOVASCULAR STRESS TEST  01/17/2012; 05/04/16   Normal stress nuclear study x 2 (2018--Normal perfusion. LVEF 66% with normal wall motion. This is a low risk study).   CERVICAL DISCECTOMY  1992   COLONOSCOPY N/A 05/27/2016   No site/explanation for blood loss found.  Erythematous mucosa in cecum and ascending colon--Cecal bx: normal.  Procedure: COLONOSCOPY;  Surgeon: Carol Ada, MD;  Location[facility]COPY;  Service: Endoscopy;  Laterality: N/A;   COLONOSCOPY W/ POLYPECTOMY  09/10/2014   Polypectomy x 2: recall 5 yrs (Dr. [name]).   CORONARY ANGIOPLASTY     CORONARY ARTERY BYPASS GRAFT  03/2006   ENTEROSCOPY N/A 07/31/2016   Procedure: ENTEROSCOPY;  Surgeon: Ladene Artist, MD;  Location: [facility] ENDOSCOPY;  Service: Endoscopy;  Laterality: N/A;   ENTEROSCOPY N/A 12/02/2016   Procedure: ENTEROSCOPY;  Surgeon: [name],  Saralyn Pilar, MD;  Location: [facility] ENDOSCOPY;  Service: Endoscopy;  Laterality: N/A;   ESOPHAGOGASTRODUODENOSCOPY N/A 05/25/2016   No site/explanation for blood loss found.  Procedure: ESOPHAGOGASTRODUODENOSCOPY (EGD);  Surgeon: Juanita Craver, MD;  Location: [facility] ENDOSCOPY;  Service: Endoscopy;  Laterality: N/A;   ESOPHAGOGASTRODUODENOSCOPY N/A 09/23/2016   Procedure: ESOPHAGOGASTRODUODENOSCOPY (EGD);  Surgeon: Carol Ada, MD;  Location: [place];  Service: Endoscopy;  Laterality: N/A;   ESOPHAGOGASTRODUODENOSCOPY (EGD) WITH PROPOFOL N/A 08/21/2018   Procedure: ESOPHAGOGASTRODUODENOSCOPY (EGD) WITH PROPOFOL;  Surgeon: Carol Ada, MD;  Location: [facility] ENDOSCOPY;  Service: Endoscopy;  Laterality: N/A;    GIVENS CAPSULE STUDY N/A 05/27/2016   No source identified.  Repeat 10/2016--results pending.  Procedure: GIVENS CAPSULE STUDY;  Surgeon: Carol Ada, MD;  Location: [facility] ENDOSCOPY;  Service: Endoscopy;  Laterality: N/A;   GIVENS CAPSULE STUDY N/A 10/15/2016   Procedure: GIVENS CAPSULE STUDY;  Surgeon: Carol Ada, MD;  Location: [facility] ENDOSCOPY;  Service: Endoscopy;  Laterality: N/A;   GIVENS CAPSULE STUDY N/A 02/13/2017   Procedure: GIVENS CAPSULE STUDY;  Surgeon: Carol Ada, MD;  Location: [place];  Service: Endoscopy;  Laterality: N/A;   NECK SURGERY  1991   NM GI BLOOD LOSS  01/27/2017   NEGATIVE   SMALL BOWEL ENTEROSCOPY  10/2016   ([facility], Dr. [name]: Double balloon enteroscopy--to the level of proximal ileum) jejunal polyps- which were removed--not bleeding & benign.  No source of bleeding was identified.   TRANSTHORACIC ECHOCARDIOGRAM  05/25/2016; 10/2017   05/2016: EF 60%, normal wall motion, grd II DD, mild aortic stenosis, dilated aortic root and ascending aorta. 10/2017: Mild LVH; no mention made of LV function.  Moderate aorticstenosis with mean gradient 19 peak gradient 41. AVA 1.1 cm^2.  05/2018 EF 55%,4.3 cm ascend aneur, mild AS.   VASECTOMY  1977    SOCIAL HISTORY: Social History   Socioeconomic History   Marital status: Married    Spouse name: Not on file   Number of children: Not on file   Years of education: Not on file   Highest education level: Not on file  Occupational History   Not on file  Social Needs   Financial resource strain: Not on file   Food insecurity    Worry: Not on file    Inability: Not on file   Transportation needs    Medical: Not on file    Non-medical: Not on file  Tobacco Use   Smoking status: Former Smoker    Types: Cigarettes   Smokeless tobacco: Never Used   Tobacco comment: QUIT I 1983  Substance and Sexual Activity   Alcohol use: No   Drug use: No   Sexual activity: Yes  Lifestyle   Physical activity    Days  per week: 0 days    Minutes per session: 0 min   Stress: Not on file  Relationships   Social connections    Talks on phone: Not on file    Gets together: Not on file    Attends religious service: Not on file    Active member of club or organization: Not on file    Attends meetings of clubs or organizations: Not on file    Relationship status: Not on file   Intimate partner violence    Fear of current or ex partner: Not on file    Emotionally abused: Not on file    Physically abused: Not on file    Forced sexual activity: Not on file  Other Topics Concern   Not on  file  Social History Narrative   Married, 3 grown children, 3 GCs.   Educ: 10th grade.   Occupation: Retired Brewing technologist.   Tob: quit 1983, smoked about 20 pack-yr hx prior.   No alcohol.    FAMILY HISTORY: Family History  Problem Relation Age of Onset   Hypertension Mother    Cancer - Other Mother        liver cancer   Heart disease Father    Heart attack Father    Cancer - Lung Father    Diabetes Father    Liver disease Sister    Anemia Sister    Heart disease Sister    Heart attack Sister    Heart disease Brother        CABG 50 YEARS AGO   Hypertension Brother    Mesothelioma Brother        HALF-BROTHER   Cancer - Other Brother        CLL   Cancer - Lung Brother        Mets to brain   Cancer - Lung Sister        HALF-SISTER   Liver disease Brother    Heart disease Brother        CABG-2012   Emphysema Maternal Grandfather    Cancer - Other Paternal Grandmother        Stomach   Heart attack Paternal Grandfather     ALLERGIES:  is allergic to statins.  MEDICATIONS:  Current Outpatient Medications  Medication Sig Dispense Refill   iron sucrose (VENOFER) 20 MG/ML injection Inject 750 mg into the vein. Every 60 days     lactulose (CHRONULAC) 10 GM/15ML solution Take 13.3333 g by mouth 2 (two) times a day.     metoprolol tartrate (LOPRESSOR) 25 MG tablet Take 0.5  tablets (12.5 mg total) by mouth 2 (two) times daily. 90 tablet 1   OCTREOTIDE ACETATE IJ Inject 30 mcg into the vein every 30 (thirty) days.      pantoprazole (PROTONIX) 40 MG tablet TAKE 1 TABLET BY MOUTH TWICE DAILY BEFORE A MEAL (Patient taking differently: Take 40 mg by mouth 2 (two) times daily before a meal. TAKE 1 TABLET BY MOUTH TWICE DAILY BEFORE A MEAL) 180 tablet 1   potassium chloride SA (K-DUR) 20 MEQ tablet TAKE 1 TABLET (20 MEQ TOTAL) BY MOUTH TWICE A DAY (Patient taking differently: Take 20 mEq by mouth 2 (two) times daily. TAKE 1 TABLET (20 MEQ TOTAL) BY MOUTH TWICE A DAY) 180 tablet 1   spironolactone (ALDACTONE) 25 MG tablet Take 0.5 tablets (12.5 mg total) by mouth daily. 45 tablet 1   torsemide (DEMADEX) 20 MG tablet Take 2 tablets (40 mg total) by mouth daily. 180 tablet 1   tranexamic acid (LYSTEDA) 650 MG TABS tablet Take 1 tablet (650 mg total) by mouth 2 (two) times daily before a meal. 60 tablet 2   No current facility-administered medications for this visit.    Facility-Administered Medications Ordered in Other Visits  Medication Dose Route Frequency Provider Last Rate Last Dose   heparin lock flush 100 unit/mL  500 Units Intracatheter Daily PRN Truitt Merle, MD       sodium chloride flush (NS) 0.9 % injection 10 mL  10 mL Intracatheter PRN Truitt Merle, MD        REVIEW OF SYSTEMS:    A 10+ POINT REVIEW OF SYSTEMS WAS OBTAINED including neurology, dermatology, psychiatry, cardiac, respiratory, lymph, extremities, GI, GU, Musculoskeletal, constitutional, breasts,  reproductive, HEENT.  All pertinent positives are noted in the HPI.  All others are negative.    PHYSICAL EXAMINATION:   ECOG FS:2 - Symptomatic, <50% confined to bed  Vitals:   01/10/19 1136  BP: 120/62  Pulse: 60  Resp: 18  Temp: 98.3 F (36.8 C)  SpO2: 91%   Wt Readings from Last 3 Encounters:  01/10/19 210 lb 8 oz (95.5 kg)  01/08/19 209 lb 14.1 oz (95.2 kg)  12/25/18 208 lb 1.8 oz (94.4  kg)   Body mass index is 31.09 kg/m.    GENERAL:alert, in no acute distress and comfortable SKIN: no acute rashes, no significant lesions EYES: conjunctiva are pink and non-injected, sclera anicteric OROPHARYNX: MMM, no exudates, no oropharyngeal erythema or ulceration NECK: supple, no JVD LYMPH:  no palpable lymphadenopathy in the cervical, axillary or inguinal regions LUNGS: clear to auscultation b/l with normal respiratory effort HEART: regular rate & rhythm ABDOMEN:  normoactive bowel sounds , non tender, not distended. Extremity: no pedal edema PSYCH: alert & oriented x 3 with fluent speech NEURO: no focal motor/sensory deficits   LABORATORY DATA:  I have reviewed the data as listed  . CBC Latest Ref Rng & Units 01/10/2019 11/12/2018 10/04/2018  WBC 4.0 - 10.5 K/uL 5.9 4.9 5.0  Hemoglobin 13.0 - 17.0 g/dL 13.3 11.8(L) 12.9(A)  Hematocrit 39.0 - 52.0 % 39.8 35.9(L) 38(A)  Platelets 150 - 400 K/uL 70(L) 68(L) 68(A)    CMP Latest Ref Rng & Units 01/10/2019 11/29/2018 11/12/2018  Glucose 70 - 99 mg/dL 154(H) - 134(H)  BUN 8 - 23 mg/dL 15 11 14   Creatinine 0.61 - 1.24 mg/dL 1.59(H) 1.5(A) 1.59(H)  Sodium 135 - 145 mmol/L 136 137 137  Potassium 3.5 - 5.1 mmol/L 3.9 4.2 4.0  Chloride 98 - 111 mmol/L 105 - 106  CO2 22 - 32 mmol/L 22 - 22  Calcium 8.9 - 10.3 mg/dL 8.4(L) - 8.2(L)  Total Protein 6.5 - 8.1 g/dL 6.5 - 6.3(L)  Total Bilirubin 0.3 - 1.2 mg/dL 7.8(HH) - 5.5(HH)  Alkaline Phos 38 - 126 U/L 124 - 123  AST 15 - 41 U/L 50(H) - 43(H)  ALT 0 - 44 U/L 29 - 26    Lab Results  Component Value Date   FERRITIN 302 01/10/2019   . RADIOGRAPHIC STUDIES: I have personally reviewed the radiological images as listed and agreed with the findings in the report. No results found.  ASSESSMENT & PLAN:   68 y.o. male with multiple medical comorbidities with Karlene Lineman with liver cirrhosis with portal hypertension, esophageal varices and portal hypertensive gastropathy with ongoing  chronic GI bleeding   #1 Chronic blood loss anemia.  Patient has had recurrent GI bleeding requiring > 20 units of PRBCs. since April 2018 and continues to have ongoing GI bleeding.  Previous workup showed LDH within normal limits at 220 and suggests against overt hemolysis. Sedimentation rate within normal limits. Coombs' test is negative. Myeloma panel and serum free light chains suggest no monoclonal paraproteinemia. PNH testing negative Slightly lower haptoglobin levels can be related to decreased hepatic production, low-level hemolysis due to transfusions or extravascular hemolysis due to splenic or hepatic sequestration.  small bowel endoscopy done on 12/02/2016: impression - The examined portion of the jejunum was normal. - Normal examined duodenum. - Portal hypertensive gastropathy. -Small (< 5 mm) esophageal varices. - No specimens collected.  Capsule Endoscopy on 02/15/17: No evidence of any bleeding during this examination. There was the possibility of an atypical AVM  manifested as an erythematous patch. No evidence of any ulcerations, erosions, masses, or polyps.  08/21/2018 upper endoscopy revealed "Small (< 5 mm) esophageal varices. Portal hypertensive gastropathy. Normal examined duodenum. No specimens collected."  07/17/2018 CT A/P w/wo contrast revealed "1. There is again fusiform dilation of the infrarenal abdominal aorta measuring up to 3.2 cm. Additionally, there is an outpouching of contrast extending along the posterolateral aspect of the aorta, beyond the intimal calcifications, measuring approximately 3.1 x 1.0 cm (image 455, series 7) with additional small outpouching located immediately superiorly. 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 2. Findings of probable pulmonary fibrosis better evaluated on recent dedicated CT of the chest May 2020. 3. Cirrhosis with portal hypertension as previously  described. Trace  Ascites. 4. Three-vessel coronary artery disease."  PLAN:   -Discussed pt labwork today, 01/10/19; all values are WNL except for RBC at 4.04, RDW at 17.5, Platelet Count at 70, Eosinophils Absolute at 0.7, Glucose Bld at 154, Creatinine at 1.59, Calcium at 8.4, albumin at 3.0, AST at 50, Total bilirubin at 7.8, GFR Est Non Af Am at 44, GFR Est AFR Am at 51,   Ferritin 302. - Discussed Hemoglobin 01/10/19 improving -Discussed Platelets 01/10/19 -Discussed Ferritin 01/10/19 300 -Discussed IRON infusion today 01/10/19 -Discussed whether to keep IV Iron at every month or to change it to every two months. -Discussed IV iron today then switch to IV Iron every two months. If Ferritin levels decrease again, will return to iv iron once a month  FOLLOW UP: -Continue IV Injectafer every 8weeks x 3 doses -continue q4weekly Sandostatin- plz schedule next 6 -labs q8weeks with IV Iron -RTC with Dr Irene Limbo in 16 weeks   The total time spent in the appt was 20 minutes and more than 50% was on counseling and direct patient cares.  All of the patient's questions were answered with apparent satisfaction. The patient knows to call the clinic with any problems, questions or concerns.    Sullivan Lone MD MS AAHIVMS [facility] Hematology/Oncology Physician [facility]  (Office):       [phone] (Work cell):  [phone] (Fax):           [phone]  I, Scot Dock, am acting as a scribe for Dr. Sullivan Lone.   .I have reviewed the above documentation for accuracy and completeness, and I agree with the above. Brunetta Genera MD

## 2019-01-14 DIAGNOSIS — C44319 Basal cell carcinoma of skin of other parts of face: Secondary | ICD-10-CM | POA: Diagnosis not present

## 2019-01-15 ENCOUNTER — Other Ambulatory Visit: Payer: Self-pay

## 2019-01-15 ENCOUNTER — Encounter (HOSPITAL_COMMUNITY)
Admission: RE | Admit: 2019-01-15 | Discharge: 2019-01-15 | Disposition: A | Discharge status: Home / Self Care | Payer: 59 | Source: Ambulatory Visit | Attending: Family Medicine | Admitting: Family Medicine

## 2019-01-15 DIAGNOSIS — J8410 Pulmonary fibrosis, unspecified: Secondary | ICD-10-CM

## 2019-01-15 NOTE — Progress Notes (Signed)
Daily Session Note  Patient Details  Name: William [name] MRN: [id number] Date of Birth: 14-Jan-1951 Referring Provider:     Pulmonary Rehab Walk Test from 10/18/2018 in [place]  Referring Provider  Dr. Anitra Lauth      Encounter Date: 01/15/2019  Check In: Session Check In - 01/15/19 0957      Check-In   Supervising physician immediately available to respond to emergencies  [facility] immediately available    Physician(s)  Dr. Benny Lennert    Location  [facility]    Staff Present  Rosebud Poles, RN, Bjorn Loser, MS, Exercise Physiologist;Yoko Mcgahee [name], RN    Virtual Visit  No    Medication changes reported      No    Fall or balance concerns reported     No    Tobacco Cessation  No Change    Warm-up and Cool-down  Performed as group-led instruction    Resistance Training Performed  Yes    VAD Patient?  No    PAD/SET Patient?  No      Pain Assessment   Currently in Pain?  No/denies    Multiple Pain Sites  No       Capillary Blood Glucose: No results found for this or any previous visit (from the past 24 hour(s)).    Social History   Tobacco Use  Smoking Status Former Smoker  . Types: Cigarettes  Smokeless Tobacco Never Used  Tobacco Comment   QUIT I 1983    Goals Met:  Exercise tolerated well No report of cardiac concerns or symptoms Strength training completed today  Goals Unmet:  Not Applicable  Comments: Service time is from 0940 to 1030   Dr. Rush Farmer is Medical Director for Pulmonary Rehab at [facility].

## 2019-01-17 ENCOUNTER — Encounter (HOSPITAL_COMMUNITY)
Admission: RE | Admit: 2019-01-17 | Discharge: 2019-01-17 | Disposition: A | Discharge status: Home / Self Care | Payer: 59 | Source: Ambulatory Visit | Attending: Family Medicine | Admitting: Family Medicine

## 2019-01-17 ENCOUNTER — Other Ambulatory Visit: Payer: Self-pay

## 2019-01-17 DIAGNOSIS — J8410 Pulmonary fibrosis, unspecified: Secondary | ICD-10-CM

## 2019-01-17 NOTE — Progress Notes (Signed)
Daily Session Note  Patient Details  Name: MERIT MAYBEE MRN: [id number] Date of Birth: [birth year] Referring Provider:     Pulmonary Rehab Walk Test from 10/18/2018 in [place]  Referring Provider  Dr. Anitra Lauth      Encounter Date: 01/17/2019  Check In: Session Check In - 01/17/19 0958      Check-In   Supervising physician immediately available to respond to emergencies  [facility] immediately available    Physician(s)  Dr. Benny Lennert    Location  [facility]    Staff Present  Rosebud Poles, RN, Bjorn Loser, MS, Exercise Physiologist;Mykel Mohl [name], RN    Virtual Visit  No    Medication changes reported      No    Fall or balance concerns reported     No    Warm-up and Cool-down  Performed as group-led instruction    Resistance Training Performed  Yes    VAD Patient?  No      Pain Assessment   Currently in Pain?  No/denies    Multiple Pain Sites  No       Capillary Blood Glucose: No results found for this or any previous visit (from the past 24 hour(s)).    Social History   Tobacco Use  Smoking Status Former Smoker  . Types: Cigarettes  Smokeless Tobacco Never Used  Tobacco Comment   QUIT I 1983    Goals Met:  Exercise tolerated well No report of cardiac concerns or symptoms Strength training completed today  Goals Unmet:  Not Applicable  Comments: Service time is from 0945 to [place]    Dr. Rush Farmer is Medical Director for Pulmonary Rehab at [facility].

## 2019-01-22 ENCOUNTER — Encounter (HOSPITAL_COMMUNITY)
Admission: RE | Admit: 2019-01-22 | Discharge: 2019-01-22 | Disposition: A | Discharge status: Home / Self Care | Payer: 59 | Source: Ambulatory Visit | Attending: Family Medicine | Admitting: Family Medicine

## 2019-01-22 ENCOUNTER — Other Ambulatory Visit: Payer: Self-pay

## 2019-01-22 VITALS — Wt 211.4 lb

## 2019-01-22 DIAGNOSIS — J8410 Pulmonary fibrosis, unspecified: Secondary | ICD-10-CM | POA: Diagnosis not present

## 2019-01-22 NOTE — Progress Notes (Signed)
Pulmonary Individual Treatment Plan  Patient Details  Name: William [name] MRN: [id number] Date of Birth: 11-09-1950 Referring Provider:     Pulmonary Rehab Walk Test from 10/18/2018 in [place]  Referring Provider  Dr. Anitra Lauth      Initial Encounter Date:    Pulmonary Rehab Walk Test from 10/18/2018 in [place]  Date  10/18/18      Visit Diagnosis: Pulmonary fibrosis ([place])  Patient's Home Medications on Admission:   Current Outpatient Medications:  .  iron sucrose (VENOFER) 20 MG/ML injection, Inject 750 mg into the vein. Every 60 days, Disp: , Rfl:  .  lactulose (CHRONULAC) 10 GM/15ML solution, Take 13.3333 g by mouth 2 (two) times a day., Disp: , Rfl:  .  metoprolol tartrate (LOPRESSOR) 25 MG tablet, Take 0.5 tablets (12.5 mg total) by mouth 2 (two) times daily., Disp: 90 tablet, Rfl: 1 .  OCTREOTIDE ACETATE IJ, Inject 30 mcg into the vein every 30 (thirty) days. , Disp: , Rfl:  .  pantoprazole (PROTONIX) 40 MG tablet, TAKE 1 TABLET BY MOUTH TWICE DAILY BEFORE A MEAL (Patient taking differently: Take 40 mg by mouth 2 (two) times daily before a meal. TAKE 1 TABLET BY MOUTH TWICE DAILY BEFORE A MEAL), Disp: 180 tablet, Rfl: 1 .  potassium chloride SA (K-DUR) 20 MEQ tablet, TAKE 1 TABLET (20 MEQ TOTAL) BY MOUTH TWICE A DAY (Patient taking differently: Take 20 mEq by mouth 2 (two) times daily. TAKE 1 TABLET (20 MEQ TOTAL) BY MOUTH TWICE A DAY), Disp: 180 tablet, Rfl: 1 .  spironolactone (ALDACTONE) 25 MG tablet, Take 0.5 tablets (12.5 mg total) by mouth daily., Disp: 45 tablet, Rfl: 1 .  torsemide (DEMADEX) 20 MG tablet, Take 2 tablets (40 mg total) by mouth daily., Disp: 180 tablet, Rfl: 1 .  tranexamic acid (LYSTEDA) 650 MG TABS tablet, Take 1 tablet (650 mg total) by mouth 2 (two) times daily before a meal., Disp: 60 tablet, Rfl: 2 No current facility-administered medications for this encounter.  Facility-Administered  Medications Ordered in Other Encounters:  .  heparin lock flush 100 unit/mL, 500 Units, Intracatheter, Daily PRN, Truitt Merle, MD .  sodium chloride flush (NS) 0.9 % injection 10 mL, 10 mL, Intracatheter, PRN, Truitt Merle, MD  Past Medical History: Past Medical History:  Diagnosis Date  . AAA (abdominal aortic aneurysm) ([place]) 03/2014   repaired 2020  . Bilateral renal cysts    simple (03/2014 MRI)  . CAD (coronary artery disease)   . Cholelithiases 2018   asymptomatic  . Chronic diastolic heart failure ([place])   . 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  Hx of portal HTN changes with mild ascites and splenomegaly.  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 Diabetes mellitus with complication ([place]) 63/8466   A1c 6.8%  . History of blood transfusion 2018 X 4 dates   "low blood" (09/21/2016)  . Hyperlipemia, mixed    elevated LFTs when on statins.    . Hypertension    Cr bump 04/01/16 so I changed benicar-hct to benicar plain and added amlodipine 5 mg.  . Iron deficiency anemia 05/2016   Acute blood loss anemia: hospitalized, required transfusion x 3 U: colonoscopy and capsule study unrevealing.  Readmitted 6/22-6/25, 2018 for symptomatic anemia again, got transfused x 4U, EGD with grd I esoph varices and  port hyt gastropathy.  W/u for ? hemolytic anemia to be pursued by hematologist as outpt.  Dr. [name], GI at [facility] following, too---he rec'd onc do bone marrow bx as of Jan 2019  . Iron deficiency anemia due to chronic blood loss 2018/19   GI: transfusions x >20 required; multiple endoscopies and bleeding scans unrevealing. Lysteda and octreotide + monthly iron infusions as of 04/2017. Iron infusions changed to every other month as of 09/2018 hem f/u.  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  . Lung field  abnormal finding on examination    Bibasilar L>R mild insp crackles-->x-ray showed mild interstitial changes/fibrosis/scarring.  Changes noted on all CXRs in 2018/2019.  Liver Transplant eval 03/2018->mild restriction on PFTs but no obstruction.  See further details in [place] "pulm fibrosis" section  . Microscopic hematuria    Eval unremarkable by Dr. Eulogio Ditch.  . 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 Obesity   . Pulmonary fibrosis ([facility])    PFTs: restrictive lung dz ([facility] transplant eval 05/2018). 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 Pulm rehab helping as of 11/2018.  [name] Spleen enlarged     Tobacco Use: Social History   Tobacco Use  Smoking Status Former Smoker  . Types: Cigarettes  Smokeless Tobacco Never Used  Tobacco Comment   QUIT I 1983    Labs: Recent Review Flowsheet Data    Labs for ITP Cardiac and Pulmonary Rehab Latest Ref Rng & Units 01/04/2017 01/25/2017 04/05/2017 01/26/2018 05/15/2018   Cholestrol 100 - 199 mg/dL - 162 - - -   LDLCALC 0 - 99 mg/dL - 100(H) - - -   HDL >39 mg/dL - 47 - - -   Trlycerides 0 - 149 mg/dL - 76 - - -   Hemoglobin A1c - 5.5 - 4.6 4.7 4.7      Capillary Blood Glucose: Lab Results  Component Value Date   GLUCAP 154 (H) 05/11/2017   GLUCAP 109 (H) 12/01/2016   GLUCAP 127 (H) 12/01/2016   GLUCAP 122 (H) 09/23/2016   GLUCAP 162 (H) 09/22/2016     Pulmonary Assessment Scores: Pulmonary Assessment Scores    Row Name 10/18/18 1210 10/23/18 1532 10/25/18 1521     ADL UCSD   ADL Phase  Entry  --  Entry   SOB Score total  --  56  --     CAT Score   CAT Score  --  --  11     mMRC Score   mMRC Score  1  --  --     UCSD: Self-administered rating of dyspnea associated with activities of daily living (ADLs) 6-point scale (0 = "not at all" to 5 = "maximal or unable to do because of breathlessness")   Scoring Scores range from 0 to 120.  Minimally important difference is 5 units  CAT: CAT can identify the health impairment of COPD patients and is better correlated with disease progression.  CAT has a scoring range of zero to 40. The CAT score is classified into four groups of low (less than 10), medium (10 - 20), high (21-30) and very high (31-40) based on the impact level of disease on health status. A CAT score over 10 suggests significant symptoms.  A worsening CAT score could be explained by an exacerbation, poor  medication adherence, poor inhaler technique, or progression of COPD or comorbid conditions.  CAT MCID is 2 points  mMRC: mMRC (Modified Medical Research Council) Dyspnea Scale is used to assess the degree of baseline functional disability in patients of respiratory disease due to dyspnea. No minimal important difference is established. A decrease in score of 1 point or greater is considered a positive change.   Pulmonary Function Assessment: Pulmonary Function Assessment - 10/18/18 0930      Breath   Bilateral Breath Sounds  Clear    Shortness of Breath  Yes;Limiting activity       Exercise Target Goals: Exercise Program Goal: Individual exercise prescription set using results from initial 6 min walk test and THRR while considering  patient's activity barriers and safety.   Exercise Prescription Goal: Initial exercise prescription builds to 30-45 minutes a day of aerobic activity, 2-3 days per week.  Home exercise guidelines will be given to patient during program as part of exercise prescription that the participant will acknowledge.  Activity Barriers & Risk Stratification: Activity Barriers & Cardiac Risk Stratification - 10/18/18 0926      Activity Barriers & Cardiac Risk Stratification   Activity Barriers  Deconditioning;Muscular Weakness;Shortness of Breath       6 Minute Walk: 6 Minute Walk    Row Name 10/18/18 1211         6 Minute Walk   Phase   Initial     Distance  456 feet     Walk Time  2.5 minutes     # of Rest Breaks  1 3.5 minutes due to desaturation. It took a long time for his saturations to rise back up. Still could not maintain saturations on 3L.     MPH  0.86     METS  1.77     RPE  12     Perceived Dyspnea   3     VO2 Peak  4.1     Symptoms  No     Resting HR  64 bpm     Resting BP  106/68     Resting Oxygen Saturation   90 %     Exercise Oxygen Saturation  during 6 min walk  81 %     Max Ex. HR  85 bpm     Max Ex. BP  128/72     2 Minute Post BP  112/66       Interval HR   1 Minute HR  85     2 Minute HR  85     3 Minute HR  74     4 Minute HR  71     5 Minute HR  70     6 Minute HR  76     2 Minute Post HR  64     Interval Heart Rate?  Yes       Interval Oxygen   Interval Oxygen?  Yes     Baseline Oxygen Saturation %  90 %     1 Minute Oxygen Saturation %  85 %     1 Minute Liters of Oxygen  2 L     2 Minute Oxygen Saturation %  81 %     2 Minute Liters of Oxygen  2 L     3 Minute Oxygen Saturation %  83 %     3 Minute Liters of Oxygen  3 L     4 Minute Oxygen Saturation %  86 %  4 Minute Liters of Oxygen  3 L     5 Minute Oxygen Saturation %  88 %     5 Minute Liters of Oxygen  3 L     6 Minute Oxygen Saturation %  86 %     6 Minute Liters of Oxygen  3 L     2 Minute Post Oxygen Saturation %  91 %     2 Minute Post Liters of Oxygen  3 L        Oxygen Initial Assessment: Oxygen Initial Assessment - 10/18/18 1209      Home Oxygen   Home Oxygen Device  Home Concentrator;E-Tanks    Sleep Oxygen Prescription  None    Home Exercise Oxygen Prescription  Continuous    Liters per minute  -1    Home at Rest Exercise Oxygen Prescription  None    Compliance with Home Oxygen Use  Yes      Initial 6 min Walk   Oxygen Used  Continuous    Liters per minute  3      Program Oxygen Prescription   Program Oxygen Prescription  Continuous    Liters per minute  4    Comments  Unable to maintain  sats on 3L during walk test, will use 4L for exercise.      Intervention   Short Term Goals  To learn and exhibit compliance with exercise, home and travel O2 prescription;To learn and understand importance of monitoring SPO2 with pulse oximeter and demonstrate accurate use of the pulse oximeter.;To learn and understand importance of maintaining oxygen saturations>88%;To learn and demonstrate proper pursed lip breathing techniques or other breathing techniques.;To learn and demonstrate proper use of respiratory medications    Long  Term Goals  Exhibits compliance with exercise, home and travel O2 prescription;Verbalizes importance of monitoring SPO2 with pulse oximeter and return demonstration;Maintenance of O2 saturations>88%;Exhibits proper breathing techniques, such as pursed lip breathing or other method taught during program session;Compliance with respiratory medication       Oxygen Re-Evaluation: Oxygen Re-Evaluation    Row Name 10/29/18 0748 11/27/18 0722 12/25/18 0735 01/21/19 1045       Program Oxygen Prescription   Program Oxygen Prescription  Continuous  Continuous  Continuous  Continuous    Liters per minute  _0 Comments  10 L for standing exercise and 8 L for seated exercise  10 L for standing exercise and 8 L for seated exercise  10 L for standing exercise and 8 L for seated exercise  10 L for standing exercise and 8 L for seated exercise      Home Oxygen   Home Oxygen Device  Home Concentrator;E-Tanks  Home Concentrator;E-Tanks  Home Concentrator;E-Tanks  Home Concentrator;E-Tanks    Sleep Oxygen Prescription  None  None  None  None    Home Exercise Oxygen Prescription  Continuous  Continuous  Continuous  Continuous    Liters per minute  _1 Home at Rest Exercise Oxygen Prescription  None  None  None  None    Compliance with Home Oxygen Use  Yes  Yes  Yes  Yes      Goals/Expected Outcomes   Short Term Goals  To learn and exhibit compliance  with exercise, home and travel O2 prescription;To learn and understand importance of monitoring SPO2 with pulse oximeter and demonstrate accurate use of the pulse oximeter.;To learn and  understand importance of maintaining oxygen saturations>88%;To learn and demonstrate proper pursed lip breathing techniques or other breathing techniques.;To learn and demonstrate proper use of respiratory medications  To learn and exhibit compliance with exercise, home and travel O2 prescription;To learn and understand importance of monitoring SPO2 with pulse oximeter and demonstrate accurate use of the pulse oximeter.;To learn and understand importance of maintaining oxygen saturations>88%;To learn and demonstrate proper pursed lip breathing techniques or other breathing techniques.;To learn and demonstrate proper use of respiratory medications  To learn and exhibit compliance with exercise, home and travel O2 prescription;To learn and understand importance of monitoring SPO2 with pulse oximeter and demonstrate accurate use of the pulse oximeter.;To learn and understand importance of maintaining oxygen saturations>88%;To learn and demonstrate proper pursed lip breathing techniques or other breathing techniques.;To learn and demonstrate proper use of respiratory medications  To learn and exhibit compliance with exercise, home and travel O2 prescription;To learn and understand importance of monitoring SPO2 with pulse oximeter and demonstrate accurate use of the pulse oximeter.;To learn and understand importance of maintaining oxygen saturations>88%;To learn and demonstrate proper pursed lip breathing techniques or other breathing techniques.;To learn and demonstrate proper use of respiratory medications    Long  Term Goals  Exhibits compliance with exercise, home and travel O2 prescription;Verbalizes importance of monitoring SPO2 with pulse oximeter and return demonstration;Maintenance of O2 saturations>88%;Exhibits proper  breathing techniques, such as pursed lip breathing or other method taught during program session;Compliance with respiratory medication  Exhibits compliance with exercise, home and travel O2 prescription;Verbalizes importance of monitoring SPO2 with pulse oximeter and return demonstration;Maintenance of O2 saturations>88%;Exhibits proper breathing techniques, such as pursed lip breathing or other method taught during program session;Compliance with respiratory medication  Exhibits compliance with exercise, home and travel O2 prescription;Verbalizes importance of monitoring SPO2 with pulse oximeter and return demonstration;Maintenance of O2 saturations>88%;Exhibits proper breathing techniques, such as pursed lip breathing or other method taught during program session;Compliance with respiratory medication  Exhibits compliance with exercise, home and travel O2 prescription;Verbalizes importance of monitoring SPO2 with pulse oximeter and return demonstration;Maintenance of O2 saturations>88%;Exhibits proper breathing techniques, such as pursed lip breathing or other method taught during program session;Compliance with respiratory medication    Goals/Expected Outcomes  compliance  compliance  compliance  compliance       Oxygen Discharge (Final Oxygen Re-Evaluation): Oxygen Re-Evaluation - 01/21/19 1045      Program Oxygen Prescription   Program Oxygen Prescription  Continuous    Liters per minute  10    Comments  10 L for standing exercise and 8 L for seated exercise      Home Oxygen   Home Oxygen Device  Home Concentrator;E-Tanks    Sleep Oxygen Prescription  None    Home Exercise Oxygen Prescription  Continuous    Liters per minute  10    Home at Rest Exercise Oxygen Prescription  None    Compliance with Home Oxygen Use  Yes      Goals/Expected Outcomes   Short Term Goals  To learn and exhibit compliance with exercise, home and travel O2 prescription;To learn and understand importance of  monitoring SPO2 with pulse oximeter and demonstrate accurate use of the pulse oximeter.;To learn and understand importance of maintaining oxygen saturations>88%;To learn and demonstrate proper pursed lip breathing techniques or other breathing techniques.;To learn and demonstrate proper use of respiratory medications    Long  Term Goals  Exhibits compliance with exercise, home and travel O2 prescription;Verbalizes importance of monitoring SPO2 with pulse oximeter  and return demonstration;Maintenance of O2 saturations>88%;Exhibits proper breathing techniques, such as pursed lip breathing or other method taught during program session;Compliance with respiratory medication    Goals/Expected Outcomes  compliance       Initial Exercise Prescription: Initial Exercise Prescription - 10/18/18 1200      Date of Initial Exercise RX and Referring Provider   Date  10/18/18    Referring Provider  Dr. Anitra Lauth      Oxygen   Oxygen  Continuous    Liters  4      Treadmill   MPH  2.1    Grade  1    Minutes  15      NuStep   Level  2    SPM  80    Minutes  15      Prescription Details   Frequency (times per week)  2    Duration  Progress to 30 minutes of continuous aerobic without signs/symptoms of physical distress      Intensity   THRR 40-80% of Max Heartrate  60-121    Ratings of Perceived Exertion  11-13    Perceived Dyspnea  0-4      Progression   Progression  Continue to progress workloads to maintain intensity without signs/symptoms of physical distress.      Resistance Training   Training Prescription  Yes    Weight  blue bands    Reps  10-15       Perform Capillary Blood Glucose checks as needed.  Exercise Prescription Changes: Exercise Prescription Changes    Row Name 10/30/18 1100 11/06/18 1100 11/13/18 1100 11/27/18 1152 12/11/18 1100     Response to Exercise   Blood Pressure (Admit)  122/60  --  104/60  124/60  118/56   Blood Pressure (Exercise)  130/58  --  144/62   130/72  132/70   Blood Pressure (Exit)  112/64  --  108/68  104/44  110/60   Heart Rate (Admit)  61 bpm  --  60 bpm  57 bpm  66 bpm   Heart Rate (Exercise)  94 bpm  --  91 bpm  92 bpm  91 bpm   Heart Rate (Exit)  68 bpm  --  67 bpm  65 bpm  72 bpm   Oxygen Saturation (Admit)  94 %  --  89 %  100 %  91 %   Oxygen Saturation (Exercise)  88 %  --  89 %  91 %  89 %   Oxygen Saturation (Exit)  92 %  --  92 %  91 %  89 %   Rating of Perceived Exertion (Exercise)  13  --  _0 Perceived Dyspnea (Exercise)  1  --  _1 Duration  Continue with 30 min of aerobic exercise without signs/symptoms of physical distress.  --  Continue with 30 min of aerobic exercise without signs/symptoms of physical distress.  Continue with 30 min of aerobic exercise without signs/symptoms of physical distress.  Continue with 30 min of aerobic exercise without signs/symptoms of physical distress.   Intensity  Other (comment) 40-80% of HRR  --  --  --  --     Progression   Progression  Continue to progress workloads to maintain intensity without signs/symptoms of physical distress.  --  Continue to progress workloads to maintain intensity without signs/symptoms of physical distress.  Continue to progress workloads to maintain intensity without signs/symptoms  of physical distress.  Continue to progress workloads to maintain intensity without signs/symptoms of physical distress.     Resistance Training   Training Prescription  Yes  --  Yes  Yes  Yes   Weight  blue bands  --  blue bands  blue bands  blue bands   Reps  10-15  --  10-15  10-15  10-15   Time  10 Minutes  --  10 Minutes  10 Minutes  10 Minutes     Oxygen   Oxygen  Continuous  --  Continuous  Continuous  Continuous   Liters  6-10  --  10  10  8-10     Treadmill   MPH  2.1  --  2.3  2.4  2.4   Grade  1  --  _0 Minutes  15  --  _1 NuStep   Level  2  --  _2 SPM  80  --  80  80  80   Minutes  15  --  _3 METs   --  --  3  2.9  3     Home Exercise Plan   Plans to continue exercise at  --  Home (comment)  --  --  --   Frequency  --  Add 2 additional days to program exercise sessions.  --  --  --   Initial Home Exercises Provided  --  11/06/18  --  --  --   [place] Name 12/25/18 1100 01/08/19 1000           Response to Exercise   Blood Pressure (Admit)  120/54  110/50      Blood Pressure (Exercise)  160/60  144/80      Blood Pressure (Exit)  102/58  118/66      Heart Rate (Admit)  68 bpm  63 bpm      Heart Rate (Exercise)  93 bpm  83 bpm      Heart Rate (Exit)  79 bpm  73 bpm      Oxygen Saturation (Admit)  94 %  93 %      Oxygen Saturation (Exercise)  89 %  90 %      Oxygen Saturation (Exit)  91 %  90 %      Rating of Perceived Exertion (Exercise)  12  13      Perceived Dyspnea (Exercise)  2  2      Duration  Continue with 30 min of aerobic exercise without signs/symptoms of physical distress.  Continue with 30 min of aerobic exercise without signs/symptoms of physical distress.        Progression   Progression  Continue to progress workloads to maintain intensity without signs/symptoms of physical distress.  Continue to progress workloads to maintain intensity without signs/symptoms of physical distress.        Resistance Training   Training Prescription  Yes  Yes      Weight  blue bands  blue bands      Reps  10-15  10-15      Time  10 Minutes  10 Minutes        Oxygen   Oxygen  Continuous  Continuous      Liters  8-10  8-10        Treadmill   MPH  2.4  2.6      Grade  3  3      Minutes  15  15        NuStep   Level  5  5      SPM  80  80      Minutes  15  15      METs  3.4  3.4         Exercise Comments: Exercise Comments    Row Name 11/06/18 1157           Exercise Comments  home exercise complete          Exercise Goals and Review: Exercise Goals    Row Name 10/18/18 1217 10/29/18 0749 11/27/18 0723 12/25/18 0735 01/21/19 1046     Exercise Goals   Increase  Physical Activity  Yes  Yes  Yes  Yes  Yes   Intervention  Provide advice, education, support and counseling about physical activity/exercise needs.;Develop an individualized exercise prescription for aerobic and resistive training based on initial evaluation findings, risk stratification, comorbidities and participant's personal goals.  Provide advice, education, support and counseling about physical activity/exercise needs.;Develop an individualized exercise prescription for aerobic and resistive training based on initial evaluation findings, risk stratification, comorbidities and participant's personal goals.  Provide advice, education, support and counseling about physical activity/exercise needs.;Develop an individualized exercise prescription for aerobic and resistive training based on initial evaluation findings, risk stratification, comorbidities and participant's personal goals.  Provide advice, education, support and counseling about physical activity/exercise needs.;Develop an individualized exercise prescription for aerobic and resistive training based on initial evaluation findings, risk stratification, comorbidities and participant's personal goals.  Provide advice, education, support and counseling about physical activity/exercise needs.;Develop an individualized exercise prescription for aerobic and resistive training based on initial evaluation findings, risk stratification, comorbidities and participant's personal goals.   Expected Outcomes  Long Term: Exercising regularly at least 3-5 days a week.;Long Term: Add in home exercise to make exercise part of routine and to increase amount of physical activity.;Short Term: Attend rehab on a regular basis to increase amount of physical activity.  Long Term: Exercising regularly at least 3-5 days a week.;Long Term: Add in home exercise to make exercise part of routine and to increase amount of physical activity.;Short Term: Attend rehab on a regular  basis to increase amount of physical activity.  Long Term: Exercising regularly at least 3-5 days a week.;Long Term: Add in home exercise to make exercise part of routine and to increase amount of physical activity.;Short Term: Attend rehab on a regular basis to increase amount of physical activity.  Long Term: Exercising regularly at least 3-5 days a week.;Long Term: Add in home exercise to make exercise part of routine and to increase amount of physical activity.;Short Term: Attend rehab on a regular basis to increase amount of physical activity.  Long Term: Exercising regularly at least 3-5 days a week.;Long Term: Add in home exercise to make exercise part of routine and to increase amount of physical activity.;Short Term: Attend rehab on a regular basis to increase amount of physical activity.   Increase Strength and Stamina  Yes  Yes  Yes  Yes  Yes   Intervention  Provide advice, education, support and counseling about physical activity/exercise needs.;Develop an individualized exercise prescription for aerobic and resistive training based on initial evaluation findings, risk stratification, comorbidities and participant's personal goals.  Provide advice, education, support and counseling about physical activity/exercise needs.;Develop an individualized exercise prescription for aerobic and  resistive training based on initial evaluation findings, risk stratification, comorbidities and participant's personal goals.  Provide advice, education, support and counseling about physical activity/exercise needs.;Develop an individualized exercise prescription for aerobic and resistive training based on initial evaluation findings, risk stratification, comorbidities and participant's personal goals.  Provide advice, education, support and counseling about physical activity/exercise needs.;Develop an individualized exercise prescription for aerobic and resistive training based on initial evaluation findings, risk  stratification, comorbidities and participant's personal goals.  Provide advice, education, support and counseling about physical activity/exercise needs.;Develop an individualized exercise prescription for aerobic and resistive training based on initial evaluation findings, risk stratification, comorbidities and participant's personal goals.   Expected Outcomes  Short Term: Increase workloads from initial exercise prescription for resistance, speed, and METs.;Short Term: Perform resistance training exercises routinely during rehab and add in resistance training at home;Long Term: Improve cardiorespiratory fitness, muscular endurance and strength as measured by increased METs and functional capacity (6MWT)  Short Term: Increase workloads from initial exercise prescription for resistance, speed, and METs.;Short Term: Perform resistance training exercises routinely during rehab and add in resistance training at home;Long Term: Improve cardiorespiratory fitness, muscular endurance and strength as measured by increased METs and functional capacity (6MWT)  Short Term: Increase workloads from initial exercise prescription for resistance, speed, and METs.;Short Term: Perform resistance training exercises routinely during rehab and add in resistance training at home;Long Term: Improve cardiorespiratory fitness, muscular endurance and strength as measured by increased METs and functional capacity (6MWT)  Short Term: Increase workloads from initial exercise prescription for resistance, speed, and METs.;Short Term: Perform resistance training exercises routinely during rehab and add in resistance training at home;Long Term: Improve cardiorespiratory fitness, muscular endurance and strength as measured by increased METs and functional capacity (6MWT)  Short Term: Increase workloads from initial exercise prescription for resistance, speed, and METs.;Short Term: Perform resistance training exercises routinely during rehab and  add in resistance training at home;Long Term: Improve cardiorespiratory fitness, muscular endurance and strength as measured by increased METs and functional capacity (6MWT)   Able to understand and use rate of perceived exertion (RPE) scale  Yes  Yes  Yes  Yes  Yes   Intervention  Provide education and explanation on how to use RPE scale  Provide education and explanation on how to use RPE scale  Provide education and explanation on how to use RPE scale  Provide education and explanation on how to use RPE scale  Provide education and explanation on how to use RPE scale   Expected Outcomes  Short Term: Able to use RPE daily in rehab to express subjective intensity level;Long Term:  Able to use RPE to guide intensity level when exercising independently  Short Term: Able to use RPE daily in rehab to express subjective intensity level;Long Term:  Able to use RPE to guide intensity level when exercising independently  Short Term: Able to use RPE daily in rehab to express subjective intensity level;Long Term:  Able to use RPE to guide intensity level when exercising independently  Short Term: Able to use RPE daily in rehab to express subjective intensity level;Long Term:  Able to use RPE to guide intensity level when exercising independently  Short Term: Able to use RPE daily in rehab to express subjective intensity level;Long Term:  Able to use RPE to guide intensity level when exercising independently   Able to understand and use Dyspnea scale  Yes  Yes  Yes  Yes  Yes   Intervention  Provide education and explanation on how  to use Dyspnea scale  Provide education and explanation on how to use Dyspnea scale  Provide education and explanation on how to use Dyspnea scale  Provide education and explanation on how to use Dyspnea scale  Provide education and explanation on how to use Dyspnea scale   Expected Outcomes  Short Term: Able to use Dyspnea scale daily in rehab to express subjective sense of shortness of  breath during exertion;Long Term: Able to use Dyspnea scale to guide intensity level when exercising independently  Short Term: Able to use Dyspnea scale daily in rehab to express subjective sense of shortness of breath during exertion;Long Term: Able to use Dyspnea scale to guide intensity level when exercising independently  Short Term: Able to use Dyspnea scale daily in rehab to express subjective sense of shortness of breath during exertion;Long Term: Able to use Dyspnea scale to guide intensity level when exercising independently  Short Term: Able to use Dyspnea scale daily in rehab to express subjective sense of shortness of breath during exertion;Long Term: Able to use Dyspnea scale to guide intensity level when exercising independently  Short Term: Able to use Dyspnea scale daily in rehab to express subjective sense of shortness of breath during exertion;Long Term: Able to use Dyspnea scale to guide intensity level when exercising independently   Knowledge and understanding of Target Heart Rate Range (THRR)  Yes  Yes  Yes  Yes  Yes   Intervention  --  --  --  --  Provide education and explanation of THRR including how the numbers were predicted and where they are located for reference   Expected Outcomes  Short Term: Able to state/look up THRR;Short Term: Able to use daily as guideline for intensity in rehab;Long Term: Able to use THRR to govern intensity when exercising independently  Short Term: Able to state/look up THRR;Short Term: Able to use daily as guideline for intensity in rehab;Long Term: Able to use THRR to govern intensity when exercising independently  Short Term: Able to state/look up THRR;Short Term: Able to use daily as guideline for intensity in rehab;Long Term: Able to use THRR to govern intensity when exercising independently  Short Term: Able to state/look up THRR;Short Term: Able to use daily as guideline for intensity in rehab;Long Term: Able to use THRR to govern intensity when  exercising independently  Short Term: Able to state/look up THRR;Short Term: Able to use daily as guideline for intensity in rehab;Long Term: Able to use THRR to govern intensity when exercising independently   Understanding of Exercise Prescription  Yes  Yes  Yes  Yes  Yes   Intervention  Provide education, explanation, and written materials on patient's individual exercise prescription  Provide education, explanation, and written materials on patient's individual exercise prescription  Provide education, explanation, and written materials on patient's individual exercise prescription  Provide education, explanation, and written materials on patient's individual exercise prescription  Provide education, explanation, and written materials on patient's individual exercise prescription   Expected Outcomes  Short Term: Able to explain program exercise prescription;Long Term: Able to explain home exercise prescription to exercise independently  Short Term: Able to explain program exercise prescription;Long Term: Able to explain home exercise prescription to exercise independently  Short Term: Able to explain program exercise prescription;Long Term: Able to explain home exercise prescription to exercise independently  Short Term: Able to explain program exercise prescription;Long Term: Able to explain home exercise prescription to exercise independently  Short Term: Able to explain program exercise prescription;Long Term: Able  to explain home exercise prescription to exercise independently      Exercise Goals Re-Evaluation : Exercise Goals Re-Evaluation    Row Name 10/29/18 0750 11/27/18 0723 12/25/18 0736 01/21/19 1046       Exercise Goal Re-Evaluation   Exercise Goals Review  Increase Physical Activity;Increase Strength and Stamina;Able to understand and use Dyspnea scale;Able to understand and use rate of perceived exertion (RPE) scale;Knowledge and understanding of Target Heart Rate Range  (THRR);Understanding of Exercise Prescription  Increase Physical Activity;Increase Strength and Stamina;Able to understand and use Dyspnea scale;Able to understand and use rate of perceived exertion (RPE) scale;Knowledge and understanding of Target Heart Rate Range (THRR);Understanding of Exercise Prescription  Increase Physical Activity;Increase Strength and Stamina;Able to understand and use Dyspnea scale;Able to understand and use rate of perceived exertion (RPE) scale;Knowledge and understanding of Target Heart Rate Range (THRR);Understanding of Exercise Prescription  Increase Physical Activity;Increase Strength and Stamina;Able to understand and use Dyspnea scale;Able to understand and use rate of perceived exertion (RPE) scale;Knowledge and understanding of Target Heart Rate Range (THRR);Understanding of Exercise Prescription    Comments  Pt has completed 2 exercise sessions. Pt's oxygen saturations have been a concern. Pt has desaturated on 4L, 6L, and 8L while walking on the treadmill. Pt's saturation levels take several minutes to recover even when his liter flow has been increased. Pt now walks on the treadmill at 10 L and on the stepper at 8L. If pt desaturates on 10L, we will have to remove him from the treadmill and put him on another modality. Pt currently exercises at 2.4 METs on the stepper. Will continue to monitor and progress as able.  Pt has completed 10 exercise sessions. We have been able to keep his saturations at 88 and above on the treadmill while exercising on 10L. Pt currently exercises at 3.5 METs on the treadmill and 2.9 METs on the stepper. Pt has recently been exposed to Covid, so he will be out for atleast the next two weeks. Pt shows positive attitude and willing to accept workload increases. Will continue to monitor and progress as able.  Pt has completed 14 exercise sessions. Pt currently exercises at 3.83 MEts on the treadmill and 3.1 METs on the stepper. Pt is working hard in  order to get a liver transplant. We will continue to work with him so he can get this transplant. Pt is accepting of workload increases. Will continue to monitor and progress as able.  Pt has completed 20 exercise sessions. Pt currently exercises at 4.07 METs on the TM and 3.0 METs on the stepper. Pt continues to work hard and increase workloads. Will continue to monitor and progress as able.    Expected Outcomes  Through exercise at rehab and at home, the patient will decrease shortness of breath with daily activities and feel confident in carrying out an exercise regime at home.  Through exercise at rehab and at home, the patient will decrease shortness of breath with daily activities and feel confident in carrying out an exercise regime at home.  Through exercise at rehab and at home, the patient will decrease shortness of breath with daily activities and feel confident in carrying out an exercise regime at home.  Through exercise at rehab and at home, the patient will decrease shortness of breath with daily activities and feel confident in carrying out an exercise regime at home.       Discharge Exercise Prescription (Final Exercise Prescription Changes): Exercise Prescription Changes - 01/08/19 1000  Response to Exercise   Blood Pressure (Admit)  110/50    Blood Pressure (Exercise)  144/80    Blood Pressure (Exit)  118/66    Heart Rate (Admit)  63 bpm    Heart Rate (Exercise)  83 bpm    Heart Rate (Exit)  73 bpm    Oxygen Saturation (Admit)  93 %    Oxygen Saturation (Exercise)  90 %    Oxygen Saturation (Exit)  90 %    Rating of Perceived Exertion (Exercise)  13    Perceived Dyspnea (Exercise)  2    Duration  Continue with 30 min of aerobic exercise without signs/symptoms of physical distress.      Progression   Progression  Continue to progress workloads to maintain intensity without signs/symptoms of physical distress.      Resistance Training   Training Prescription  Yes     Weight  blue bands    Reps  10-15    Time  10 Minutes      Oxygen   Oxygen  Continuous    Liters  8-10      Treadmill   MPH  2.6    Grade  3    Minutes  15      NuStep   Level  5    SPM  80    Minutes  15    METs  3.4       Nutrition:  Target Goals: Understanding of nutrition guidelines, daily intake of sodium <154m, cholesterol <2036m calories 30% from fat and 7% or less from saturated fats, daily to have 5 or more servings of fruits and vegetables.  Biometrics: Pre Biometrics - 10/18/18 0926      Pre Biometrics   Grip Strength  32 kg        Nutrition Therapy Plan and Nutrition Goals:   Nutrition Assessments:   Nutrition Goals Re-Evaluation:   Nutrition Goals Discharge (Final Nutrition Goals Re-Evaluation):   Psychosocial: Target Goals: Acknowledge presence or absence of significant depression and/or stress, maximize coping skills, provide positive support system. Participant is able to verbalize types and ability to use techniques and skills needed for reducing stress and depression.  Initial Review & Psychosocial Screening: Initial Psych Review & Screening - 10/18/18 0945      Initial Review   Current issues with  None Identified      Family Dynamics   Good Support System?  Yes   [facility]   Barriers   Psychosocial barriers to participate in program  There are no identifiable barriers or psychosocial needs.      Screening Interventions   Interventions  Encouraged to exercise       Quality of Life Scores:  Scores of 19 and below usually indicate a poorer quality of life in these areas.  A difference of  2-3 points is a clinically meaningful difference.  A difference of 2-3 points in the total score of the Quality of Life Index has been associated with significant improvement in overall quality of life, self-image, physical symptoms, and general health in studies assessing change in quality of  life.  PHQ-9: Recent Review Flowsheet Data    [facility] 10/18/2018 10/18/2018 01/26/2018 04/05/2017 01/04/2017   Decreased Interest - 0 0 2 0   Down, Depressed, Hopeless 0 0 0 0 0   PHQ - 2 Score 0 0 0 2 0   Altered sleeping 0 - 1 0 -  Tired, decreased energy 0 - 1 1 -   Change in appetite 0 - 0 1 -   Feeling bad or failure about yourself  0 - 0 0 -   Trouble concentrating 0 - 0 0 -   Moving slowly or fidgety/restless 0 - 0 0 -   Suicidal thoughts 0 - 0 0 -   PHQ-9 Score 0 - 2 4 -   Difficult doing work/chores Not difficult at all - Not difficult at all Not difficult at all -     Interpretation of Total Score  Total Score Depression Severity:  1-4 = Minimal depression, 5-9 = Mild depression, 10-14 = Moderate depression, 15-19 = Moderately severe depression, 20-27 = Severe depression   Psychosocial Evaluation and Intervention: Psychosocial Evaluation - 10/18/18 0946      Psychosocial Evaluation & Interventions   Interventions  Encouraged to exercise with the program and follow exercise prescription    Comments  Being evaluated at [facility] for liver transplant    Expected Outcomes  No barriers to participation in pulmonary rehab    Continue Psychosocial Services   No Follow up required       Psychosocial Re-Evaluation: Psychosocial Re-Evaluation    Row Name 10/18/18 0936 10/18/18 0947 10/29/18 1238 10/29/18 1246 11/28/18 1022     Psychosocial Re-Evaluation   Current issues with  None Identified  None Identified  None Identified  --  None Identified   Comments  --  --  No barriers to participation in pulmonary rehab  --  no barriers identified   Expected Outcomes  --  --  Continues to be barrier free while in program  --  will continue to have no barriers or psychosocial concerns while in pulmonary rehab   Interventions  Encouraged to attend Pulmonary Rehabilitation for the exercise  Encouraged to attend Pulmonary Rehabilitation for the exercise  --  Encouraged to  attend Pulmonary Rehabilitation for the exercise  Encouraged to attend Pulmonary Rehabilitation for the exercise   Continue Psychosocial Services   No Follow up required  No Follow up required  No Follow up required  --  No Follow up required   Baker Name 12/24/18 0946 01/21/19 1003 01/21/19 1006         Psychosocial Re-Evaluation   Current issues with  None Identified  None Identified  --     Comments  Essam remains free of barriers to participation in pulmonary rehab and has no psychosocial concerns at this point.  Braylan is very positive and appreciative of the program teaching him how to exercise safely.  He continues to persue a liver transplant through [facility], but has not been listed yet.  --     Expected Outcomes  That Ludger will continue to have a positive attitude and no psychosocial concerns.  --  That Knolan continues to have no barriers or psychosocial concerns while in pulmonary rehab.     Interventions  Encouraged to attend Pulmonary Rehabilitation for the exercise  Encouraged to attend Pulmonary Rehabilitation for the exercise  --     Continue Psychosocial Services   No Follow up required  No Follow up required  --        Psychosocial Discharge (Final Psychosocial Re-Evaluation): Psychosocial Re-Evaluation - 01/21/19 1006      Psychosocial Re-Evaluation   Expected Outcomes  That Rad continues to have no barriers or psychosocial concerns while in pulmonary rehab.       Education: Education Goals: Education classes will be provided on  a weekly basis, covering required topics. Participant will state understanding/return demonstration of topics presented.  Learning Barriers/Preferences: Learning Barriers/Preferences - 10/18/18 1515      Learning Barriers/Preferences   Learning Barriers  None       Education Topics: Risk Factor Reduction:  -Group instruction that is supported by a PowerPoint presentation. Instructor discusses the definition of a risk factor, different risk  factors for pulmonary disease, and how the heart and lungs work together.     PULMONARY REHAB OTHER RESPIRATORY from 01/17/2019 in [place]  Date  01/08/19  Educator  -- [Handout]      Nutrition for Pulmonary Patient:  -Group instruction provided by PowerPoint slides, verbal discussion, and written materials to support subject matter. The instructor gives an explanation and review of healthy diet recommendations, which includes a discussion on weight management, recommendations for fruit and vegetable consumption, as well as protein, fluid, caffeine, fiber, sodium, sugar, and alcohol. Tips for eating when patients are short of breath are discussed.   Pursed Lip Breathing:  -Group instruction that is supported by demonstration and informational handouts. Instructor discusses the benefits of pursed lip and diaphragmatic breathing and detailed demonstration on how to preform both.     Oxygen Safety:  -Group instruction provided by PowerPoint, verbal discussion, and written material to support subject matter. There is an overview of "What is Oxygen" and "Why do we need it".  Instructor also reviews how to create a safe environment for oxygen use, the importance of using oxygen as prescribed, and the risks of noncompliance. There is a brief discussion on traveling with oxygen and resources the patient may utilize.   Oxygen Equipment:  -Group instruction provided by [facility] Staff utilizing handouts, written materials, and equipment demonstrations.   Signs and Symptoms:  -Group instruction provided by written material and verbal discussion to support subject matter. Warning signs and symptoms of infection, stroke, and heart attack are reviewed and when to call the physician/911 reinforced. Tips for preventing the spread of infection discussed.   Advanced Directives:  -Group instruction provided by verbal instruction and written material to support subject  matter. Instructor reviews Advanced Directive laws and proper instruction for filling out document.   Pulmonary Video:  -Group video education that reviews the importance of medication and oxygen compliance, exercise, good nutrition, pulmonary hygiene, and pursed lip and diaphragmatic breathing for the pulmonary patient.   Exercise for the Pulmonary Patient:  -Group instruction that is supported by a PowerPoint presentation. Instructor discusses benefits of exercise, core components of exercise, frequency, duration, and intensity of an exercise routine, importance of utilizing pulse oximetry during exercise, safety while exercising, and options of places to exercise outside of rehab.     Pulmonary Medications:  -Verbally interactive group education provided by instructor with focus on inhaled medications and proper administration.   Anatomy and Physiology of the Respiratory System and Intimacy:  -Group instruction provided by PowerPoint, verbal discussion, and written material to support subject matter. Instructor reviews respiratory cycle and anatomical components of the respiratory system and their functions. Instructor also reviews differences in obstructive and restrictive respiratory diseases with examples of each. Intimacy, Sex, and Sexuality differences are reviewed with a discussion on how relationships can change when diagnosed with pulmonary disease. Common sexual concerns are reviewed.   MD DAY -A group question and answer session with a medical doctor that allows participants to ask questions that relate to their pulmonary disease state.   OTHER EDUCATION -Group or  individual verbal, written, or video instructions that support the educational goals of the pulmonary rehab program.   PULMONARY REHAB OTHER RESPIRATORY from 01/17/2019 in [place]  Date  01/17/19 [Meditation handout]  Educator  -- [Handout]      Holiday Eating Survival Tips:   -Group instruction provided by PowerPoint slides, verbal discussion, and written materials to support subject matter. The instructor gives patients tips, tricks, and techniques to help them not only survive but enjoy the holidays despite the onslaught of food that accompanies the holidays.   PULMONARY REHAB OTHER RESPIRATORY from 01/17/2019 in [place]  Date  12/25/18  Educator  01/08/19 [Handout]      Knowledge Questionnaire Score: Knowledge Questionnaire Score - 10/23/18 1532      Knowledge Questionnaire Score   Pre Score  16/18       Core Components/Risk Factors/Patient Goals at Admission: Personal Goals and Risk Factors at Admission - 10/18/18 0937      Core Components/Risk Factors/Patient Goals on Admission   Improve shortness of breath with ADL's  Yes       Core Components/Risk Factors/Patient Goals Review:  Goals and Risk Factor Review    Row Name 10/18/18 2831 10/29/18 1239 11/28/18 1023 12/24/18 0949 01/21/19 1007     Core Components/Risk Factors/Patient Goals Review   Personal Goals Review  Increase knowledge of respiratory medications and ability to use respiratory devices properly.;Improve shortness of breath with ADL's;Develop more efficient breathing techniques such as purse lipped breathing and diaphragmatic breathing and practicing self-pacing with activity.  Increase knowledge of respiratory medications and ability to use respiratory devices properly.;Improve shortness of breath with ADL's;Develop more efficient breathing techniques such as purse lipped breathing and diaphragmatic breathing and practicing self-pacing with activity.  Increase knowledge of respiratory medications and ability to use respiratory devices properly.;Improve shortness of breath with ADL's;Develop more efficient breathing techniques such as purse lipped breathing and diaphragmatic breathing and practicing self-pacing with activity.  Develop more efficient  breathing techniques such as purse lipped breathing and diaphragmatic breathing and practicing self-pacing with activity.;Increase knowledge of respiratory medications and ability to use respiratory devices properly.;Improve shortness of breath with ADL's  Develop more efficient breathing techniques such as purse lipped breathing and diaphragmatic breathing and practicing self-pacing with activity.;Increase knowledge of respiratory medications and ability to use respiratory devices properly.;Improve shortness of breath with ADL's   Review  --  Just started program, has attended 2 exercise sessions, too early to meet goals for program.  Is requiring 10 L of oxygen to walk on the treadmill and 8 L on the nustep.  Is being evaluated for a liver transplant at [facility].  Is requiring 10 L of oxygen whilw walking on the treadmill and 8 L while on nustep, has done very well, always present for class, treadmill 2.4 mph with 2% grade, level 4 on nustep  [place] is nearing graduation from the program and has progressed well.  He is being evaluated at [facility] for a liver transplant, he is walking on the treadmill @ 2.4 mph and a 3% grade. He is level 4 on the nustep.  He is exercising at a higher level than when he started the program, walking on the treadmill at 2.6 mph and 3% incline, and level 5 on the nustep.  His shortness of breath has improved while exercising.   Expected Outcomes  --  See admission goals  Will continue to increase workloads as tolerated.  That he  will continue to exercise on his own after graduation with the guidelines he has been taught.  See admission goals.      Core Components/Risk Factors/Patient Goals at Discharge (Final Review):  Goals and Risk Factor Review - 01/21/19 1007      Core Components/Risk Factors/Patient Goals Review   Personal Goals Review  Develop more efficient breathing techniques such as purse lipped breathing and diaphragmatic breathing and practicing self-pacing with  activity.;Increase knowledge of respiratory medications and ability to use respiratory devices properly.;Improve shortness of breath with ADL's    Review  He is exercising at a higher level than when he started the program, walking on the treadmill at 2.6 mph and 3% incline, and level 5 on the nustep.  His shortness of breath has improved while exercising.    Expected Outcomes  See admission goals.       ITP Comments:   Comments: ITP REVIEW Pt is making expected progress toward pulmonary rehab goals after completing 20 sessions. Recommend continued exercise, life style modification, education, and utilization of breathing techniques to increase stamina and strength and decrease shortness of breath with exertion.

## 2019-01-22 NOTE — Progress Notes (Signed)
Daily Session Note  Patient Details  Name: William [name] MRN: [id number] Date of Birth: 05/23/1950 Referring Provider:     Pulmonary Rehab Walk Test from 10/18/2018 in [facility]  Referring Provider  Dr. McGowen      Encounter Date: 01/22/2019  Check In: Session Check In - 01/22/19 1041      Check-In   Supervising physician immediately available to respond to emergencies  [facility] immediately available    Physician(s)  Dr. Kamineni    Location  [facility]    Staff Present  Joan Behrens, RN, BSN;Dalton Fletcher, MS, Exercise Physiologist;Lisa Hughes, RN    Virtual Visit  No    Medication changes reported      No    Fall or balance concerns reported     No    Tobacco Cessation  No Change    Warm-up and Cool-down  Performed on first and last piece of equipment    Resistance Training Performed  Yes    VAD Patient?  No    PAD/SET Patient?  No      Pain Assessment   Currently in Pain?  No/denies    Multiple Pain Sites  No       Capillary Blood Glucose: No results found for this or any previous visit (from the past 24 hour(s)).  Exercise Prescription Changes - 01/22/19 1100      Response to Exercise   Blood Pressure (Admit)  122/70    Blood Pressure (Exercise)  148/62    Blood Pressure (Exit)  120/70    Heart Rate (Admit)  60 bpm    Heart Rate (Exercise)  92 bpm    Heart Rate (Exit)  70 bpm    Oxygen Saturation (Admit)  92 %    Oxygen Saturation (Exercise)  91 %    Oxygen Saturation (Exit)  88 %    Rating of Perceived Exertion (Exercise)  13    Perceived Dyspnea (Exercise)  2    Duration  Continue with 30 min of aerobic exercise without signs/symptoms of physical distress.      Progression   Progression  Continue to progress workloads to maintain intensity without signs/symptoms of physical distress.      Resistance Training   Training Prescription  Yes    Weight  blue bands    Reps  10-15    Time  10  Minutes      Oxygen   Oxygen  Continuous    Liters  8-10      Treadmill   MPH  2.6    Grade  3    Minutes  15      NuStep   Level  5    SPM  80    Minutes  15    METs  3.3       Social History   Tobacco Use  Smoking Status Former Smoker  . Types: Cigarettes  Smokeless Tobacco Never Used  Tobacco Comment   QUIT I 1983    Goals Met:  Independence with exercise equipment Exercise tolerated well Strength training completed today  Goals Unmet:  Not Applicable  Comments: Service time is from 0950 to 1045    Dr. Wesam G. Yacoub is Medical Director for Pulmonary Rehab at  Hospital. 

## 2019-01-24 ENCOUNTER — Other Ambulatory Visit: Payer: Self-pay

## 2019-01-24 ENCOUNTER — Encounter (HOSPITAL_COMMUNITY)
Admission: RE | Admit: 2019-01-24 | Discharge: 2019-01-24 | Disposition: A | Discharge status: Home / Self Care | Payer: 59 | Source: Ambulatory Visit | Attending: Family Medicine | Admitting: Family Medicine

## 2019-01-24 DIAGNOSIS — J8410 Pulmonary fibrosis, unspecified: Secondary | ICD-10-CM | POA: Diagnosis not present

## 2019-01-24 NOTE — Progress Notes (Signed)
Daily Session Note  Patient Details  Name: William [name] MRN: [id number] Date of Birth: [birth year] Referring Provider:     Pulmonary Rehab Walk Test from 10/18/2018 in [place]  Referring Provider  Dr. Anitra Lauth      Encounter Date: 01/24/2019  Check In: Session Check In - 01/24/19 0955      Check-In   Supervising physician immediately available to respond to emergencies  [facility] immediately available    Physician(s)  Dr. Sloan Leiter    Location  [facility]    Staff Present  Rosebud Poles, RN, Bjorn Loser, MS, Exercise Physiologist;Kharisma Glasner [name], RN    Virtual Visit  No    Medication changes reported      No    Fall or balance concerns reported     No    Tobacco Cessation  No Change    Warm-up and Cool-down  Performed on first and last piece of equipment    Resistance Training Performed  Yes    VAD Patient?  No    PAD/SET Patient?  No      Pain Assessment   Currently in Pain?  No/denies    Pain Score  0-No pain    Multiple Pain Sites  No       Capillary Blood Glucose: No results found for this or any previous visit (from the past 24 hour(s)).    Social History   Tobacco Use  Smoking Status Former Smoker  . Types: Cigarettes  Smokeless Tobacco Never Used  Tobacco Comment   QUIT I 1983    Goals Met:  Exercise tolerated well No report of cardiac concerns or symptoms Strength training completed today  Goals Unmet:  Not Applicable  Comments: Service time is from 0947 to 1045    Dr. Rush Farmer is Medical Director for Pulmonary Rehab at [facility].

## 2019-01-26 MED FILL — TRANEXAMIC ACID 650 MG TAB: 650 | 30 days supply | Qty: 60 | Fill #2

## 2019-01-29 ENCOUNTER — Other Ambulatory Visit: Payer: Self-pay

## 2019-01-29 ENCOUNTER — Encounter (HOSPITAL_COMMUNITY)
Admission: RE | Admit: 2019-01-29 | Discharge: 2019-01-29 | Disposition: A | Discharge status: Home / Self Care | Payer: 59 | Source: Ambulatory Visit | Attending: Family Medicine | Admitting: Family Medicine

## 2019-01-29 DIAGNOSIS — J8410 Pulmonary fibrosis, unspecified: Secondary | ICD-10-CM

## 2019-01-29 DIAGNOSIS — J181 Lobar pneumonia, unspecified organism: Secondary | ICD-10-CM | POA: Diagnosis not present

## 2019-01-29 NOTE — Progress Notes (Signed)
Daily Session Note  Patient Details  Name: William [name] MRN: [id number] Date of Birth: 11-03-1950 Referring Provider:     Pulmonary Rehab Walk Test from 10/18/2018 in [place]  Referring Provider  Dr. Anitra Lauth      Encounter Date: 01/29/2019  Check In: Session Check In - 01/29/19 1058      Check-In   Supervising physician immediately available to respond to emergencies  [facility] immediately available    Physician(s)  Dr. [place] Crane    Location  [facility]    Staff Present  Rosebud Poles, RN, Bjorn Loser, MS, Exercise Physiologist;Lisa [name], RN    Virtual Visit  No    Medication changes reported      No    Fall or balance concerns reported     No    Tobacco Cessation  No Change    Warm-up and Cool-down  Performed on first and last piece of equipment    Resistance Training Performed  Yes    VAD Patient?  No    PAD/SET Patient?  No      Pain Assessment   Currently in Pain?  No/denies    Multiple Pain Sites  No       Capillary Blood Glucose: No results found for this or any previous visit (from the past 24 hour(s)).    Social History   Tobacco Use  Smoking Status Former Smoker  . Types: Cigarettes  Smokeless Tobacco Never Used  Tobacco Comment   QUIT I 1983    Goals Met:  Independence with exercise equipment Exercise tolerated well Strength training completed today  Goals Unmet:  Not Applicable  Comments: Service time is from 0950 to [place]    Dr. Rush Farmer is Medical Director for Pulmonary Rehab at [facility].

## 2019-01-30 ENCOUNTER — Telehealth: Payer: Self-pay | Admitting: Hematology

## 2019-01-30 NOTE — Telephone Encounter (Signed)
Returned patient's phone call regarding rescheduling 12/31 appointment, spoke with patient's wife and appointment has moved up to 01/04.

## 2019-01-31 ENCOUNTER — Other Ambulatory Visit: Payer: Self-pay

## 2019-01-31 ENCOUNTER — Encounter (HOSPITAL_COMMUNITY)
Admission: RE | Admit: 2019-01-31 | Discharge: 2019-01-31 | Disposition: A | Discharge status: Home / Self Care | Payer: 59 | Source: Ambulatory Visit | Attending: Family Medicine | Admitting: Family Medicine

## 2019-01-31 DIAGNOSIS — J8410 Pulmonary fibrosis, unspecified: Secondary | ICD-10-CM

## 2019-01-31 NOTE — Progress Notes (Signed)
Daily Session Note  Patient Details  Name: William [name] MRN: [id number] Date of Birth: 01/03/1951 Referring Provider:     Pulmonary Rehab Walk Test from 10/18/2018 in [facility]  Referring Provider  Dr. McGowen      Encounter Date: 01/31/2019  Check In: Session Check In - 01/31/19 1026      Check-In   Supervising physician immediately available to respond to emergencies  [facility] immediately available    Physician(s)  Dr. Lancaster    Location  [facility]    Staff Present  Joan Behrens, RN, BSN;Dalton Fletcher, MS, Exercise Physiologist;Lisa Hughes, RN    Virtual Visit  No    Medication changes reported      No    Fall or balance concerns reported     No    Tobacco Cessation  No Change    Warm-up and Cool-down  Performed on first and last piece of equipment    Resistance Training Performed  Yes    VAD Patient?  No    PAD/SET Patient?  No      Pain Assessment   Currently in Pain?  No/denies    Pain Score  0-No pain    Multiple Pain Sites  No       Capillary Blood Glucose: No results found for this or any previous visit (from the past 24 hour(s)).    Social History   Tobacco Use  Smoking Status Former Smoker  . Types: Cigarettes  Smokeless Tobacco Never Used  Tobacco Comment   QUIT I 1983    Goals Met:  Independence with exercise equipment Exercise tolerated well Strength training completed today  Goals Unmet:  Not Applicable  Comments: Service time is from 0920 to 1050    Dr. Wesam G. Yacoub is Medical Director for Pulmonary Rehab at [facility]. 

## 2019-02-05 NOTE — Progress Notes (Signed)
Discharge Progress Report  Patient Details  Name: William [name] MRN: [id number] Date of Birth: 1950/11/30 Referring Provider:     Pulmonary Rehab Walk Test from 10/18/2018 in [place]  Referring Provider  Dr. Anitra Lauth       Number of Visits: 24  Reason for Discharge:  Patient reached a stable level of exercise. Patient independent in their exercise. Patient has met program and personal goals.  Smoking History:  Social History   Tobacco Use  Smoking Status Former Smoker  . Types: Cigarettes  Smokeless Tobacco Never Used  Tobacco Comment   QUIT I 1983    Diagnosis:  Pulmonary fibrosis ([place])  ADL [facility]: Pulmonary Assessment Scores    Row Name 10/18/18 1210 10/23/18 1532 10/25/18 1521     ADL [facility]   ADL Phase  Entry  --  Entry   SOB Score total  --  56  --     CAT Score   CAT Score  --  --  11     mMRC Score   mMRC Score  1  --  --   Row Name 01/31/19 1056         ADL [facility]   ADL Phase  Exit     SOB Score total  18       CAT Score   CAT Score  9       mMRC Score   mMRC Score  1        Initial Exercise Prescription: Initial Exercise Prescription - 10/18/18 1200      Date of Initial Exercise RX and Referring Provider   Date  10/18/18    Referring Provider  Dr. Anitra Lauth      Oxygen   Oxygen  Continuous    Liters  4      Treadmill   MPH  2.1    Grade  1    Minutes  15      NuStep   Level  2    SPM  80    Minutes  15      Prescription Details   Frequency (times per week)  2    Duration  Progress to 30 minutes of continuous aerobic without signs/symptoms of physical distress      Intensity   THRR 40-80% of Max Heartrate  60-121    Ratings of Perceived Exertion  11-13    Perceived Dyspnea  0-4      Progression   Progression  Continue to progress workloads to maintain intensity without signs/symptoms of physical distress.      Resistance Training   Training Prescription  Yes    Weight  blue bands     Reps  10-15       Discharge Exercise Prescription (Final Exercise Prescription Changes): Exercise Prescription Changes - 01/22/19 1100      Response to Exercise   Blood Pressure (Admit)  122/70    Blood Pressure (Exercise)  148/62    Blood Pressure (Exit)  120/70    Heart Rate (Admit)  60 bpm    Heart Rate (Exercise)  92 bpm    Heart Rate (Exit)  70 bpm    Oxygen Saturation (Admit)  92 %    Oxygen Saturation (Exercise)  91 %    Oxygen Saturation (Exit)  88 %    Rating of Perceived Exertion (Exercise)  13    Perceived Dyspnea (Exercise)  2    Duration  Continue with 30 min of  aerobic exercise without signs/symptoms of physical distress.      Progression   Progression  Continue to progress workloads to maintain intensity without signs/symptoms of physical distress.      Resistance Training   Training Prescription  Yes    Weight  blue bands    Reps  10-15    Time  10 Minutes      Oxygen   Oxygen  Continuous    Liters  8-10      Treadmill   MPH  2.6    Grade  3    Minutes  15      NuStep   Level  5    SPM  80    Minutes  15    METs  3.3       Functional Capacity: 6 Minute Walk    Row Name 10/18/18 1211 01/31/19 1053       6 Minute Walk   Phase  Initial  Discharge    Distance  456 feet  1254 feet    Distance Feet Change  --  798 ft    Walk Time  2.5 minutes  6 minutes    # of Rest Breaks  1 3.5 minutes due to desaturation. It took a long time for his saturations to rise back up. Still could not maintain saturations on 3L.  0    MPH  0.86  2.38    METS  1.77  2.61    RPE  12  13    Perceived Dyspnea   3  1    VO2 Peak  4.1  9.13    Symptoms  No  No    Resting HR  64 bpm  60 bpm    Resting BP  106/68  114/60    Resting Oxygen Saturation   90 %  93 %    Exercise Oxygen Saturation  during 6 min walk  81 %  89 %    Max Ex. HR  85 bpm  87 bpm    Max Ex. BP  128/72  138/74    2 Minute Post BP  112/66  122/62      Interval HR   1 Minute HR  85  67    2  Minute HR  85  81    3 Minute HR  74  82    4 Minute HR  71  87    5 Minute HR  70  84    6 Minute HR  76  82    2 Minute Post HR  64  61    Interval Heart Rate?  Yes  Yes      Interval Oxygen   Interval Oxygen?  Yes  Yes    Baseline Oxygen Saturation %  90 %  93 %    1 Minute Oxygen Saturation %  85 %  95 %    1 Minute Liters of Oxygen  2 L  10 L    2 Minute Oxygen Saturation %  81 %  90 %    2 Minute Liters of Oxygen  2 L  10 L    3 Minute Oxygen Saturation %  83 %  89 %    3 Minute Liters of Oxygen  3 L  10 L    4 Minute Oxygen Saturation %  86 %  89 %    4 Minute Liters of Oxygen  3 L  10 L    5  Minute Oxygen Saturation %  88 %  89 %    5 Minute Liters of Oxygen  3 L  10 L    6 Minute Oxygen Saturation %  86 %  89 %    6 Minute Liters of Oxygen  3 L  10 L    2 Minute Post Oxygen Saturation %  91 %  99 %    2 Minute Post Liters of Oxygen  3 L  10 L       Psychological, QOL, Others - Outcomes: PHQ 2/9: Depression screen [facility] 2/9 10/18/2018 10/18/2018 01/26/2018 04/05/2017 01/04/2017  Decreased Interest - 0 0 2 0  Down, Depressed, Hopeless 0 0 0 0 0  PHQ - 2 Score 0 0 0 2 0  Altered sleeping 0 - 1 0 -  Tired, decreased energy 0 - 1 1 -  Change in appetite 0 - 0 1 -  Feeling bad or failure about yourself  0 - 0 0 -  Trouble concentrating 0 - 0 0 -  Moving slowly or fidgety/restless 0 - 0 0 -  Suicidal thoughts 0 - 0 0 -  PHQ-9 Score 0 - 2 4 -  Difficult doing work/chores Not difficult at all - Not difficult at all Not difficult at all -  Some recent data might be hidden    Quality of Life:   Personal Goals: Goals established at orientation with interventions provided to work toward goal. Personal Goals and Risk Factors at Admission - 10/18/18 0937      Core Components/Risk Factors/Patient Goals on Admission   Improve shortness of breath with ADL's  Yes        Personal Goals Discharge: Goals and Risk Factor Review    Row Name 10/18/18 2620 10/29/18 1239 11/28/18  1023 12/24/18 0949 01/21/19 1007     Core Components/Risk Factors/Patient Goals Review   Personal Goals Review  Increase knowledge of respiratory medications and ability to use respiratory devices properly.;Improve shortness of breath with ADL's;Develop more efficient breathing techniques such as purse lipped breathing and diaphragmatic breathing and practicing self-pacing with activity.  Increase knowledge of respiratory medications and ability to use respiratory devices properly.;Improve shortness of breath with ADL's;Develop more efficient breathing techniques such as purse lipped breathing and diaphragmatic breathing and practicing self-pacing with activity.  Increase knowledge of respiratory medications and ability to use respiratory devices properly.;Improve shortness of breath with ADL's;Develop more efficient breathing techniques such as purse lipped breathing and diaphragmatic breathing and practicing self-pacing with activity.  Develop more efficient breathing techniques such as purse lipped breathing and diaphragmatic breathing and practicing self-pacing with activity.;Increase knowledge of respiratory medications and ability to use respiratory devices properly.;Improve shortness of breath with ADL's  Develop more efficient breathing techniques such as purse lipped breathing and diaphragmatic breathing and practicing self-pacing with activity.;Increase knowledge of respiratory medications and ability to use respiratory devices properly.;Improve shortness of breath with ADL's   Review  --  Just started program, has attended 2 exercise sessions, too early to meet goals for program.  Is requiring 10 L of oxygen to walk on the treadmill and 8 L on the nustep.  Is being evaluated for a liver transplant at [facility]  Is requiring 10 L of oxygen whilw walking on the treadmill and 8 L while on nustep, has done very well, always present for class, treadmill 2.4 mph with 2% grade, level 4 on nustep  Jhamal is  nearing graduation from the program and has progressed well.  He is being evaluated at [facility] for a liver transplant, he is walking on the treadmill @ 2.4 mph and a 3% grade. He is level 4 on the nustep.  He is exercising at a higher level than when he started the program, walking on the treadmill at 2.6 mph and 3% incline, and level 5 on the nustep.  His shortness of breath has improved while exercising.   Expected Outcomes  --  See admission goals  Will continue to increase workloads as tolerated.  That he will continue to exercise on his own after graduation with the guidelines he has been taught.  See admission goals.      Exercise Goals and Review: Exercise Goals    Row Name 10/18/18 1217 10/29/18 0749 11/27/18 0723 12/25/18 0735 01/21/19 1046     Exercise Goals   Increase Physical Activity  Yes  Yes  Yes  Yes  Yes   Intervention  Provide advice, education, support and counseling about physical activity/exercise needs.;Develop an individualized exercise prescription for aerobic and resistive training based on initial evaluation findings, risk stratification, comorbidities and participant's personal goals.  Provide advice, education, support and counseling about physical activity/exercise needs.;Develop an individualized exercise prescription for aerobic and resistive training based on initial evaluation findings, risk stratification, comorbidities and participant's personal goals.  Provide advice, education, support and counseling about physical activity/exercise needs.;Develop an individualized exercise prescription for aerobic and resistive training based on initial evaluation findings, risk stratification, comorbidities and participant's personal goals.  Provide advice, education, support and counseling about physical activity/exercise needs.;Develop an individualized exercise prescription for aerobic and resistive training based on initial evaluation findings, risk stratification, comorbidities and  participant's personal goals.  Provide advice, education, support and counseling about physical activity/exercise needs.;Develop an individualized exercise prescription for aerobic and resistive training based on initial evaluation findings, risk stratification, comorbidities and participant's personal goals.   Expected Outcomes  Long Term: Exercising regularly at least 3-5 days a week.;Long Term: Add in home exercise to make exercise part of routine and to increase amount of physical activity.;Short Term: Attend rehab on a regular basis to increase amount of physical activity.  Long Term: Exercising regularly at least 3-5 days a week.;Long Term: Add in home exercise to make exercise part of routine and to increase amount of physical activity.;Short Term: Attend rehab on a regular basis to increase amount of physical activity.  Long Term: Exercising regularly at least 3-5 days a week.;Long Term: Add in home exercise to make exercise part of routine and to increase amount of physical activity.;Short Term: Attend rehab on a regular basis to increase amount of physical activity.  Long Term: Exercising regularly at least 3-5 days a week.;Long Term: Add in home exercise to make exercise part of routine and to increase amount of physical activity.;Short Term: Attend rehab on a regular basis to increase amount of physical activity.  Long Term: Exercising regularly at least 3-5 days a week.;Long Term: Add in home exercise to make exercise part of routine and to increase amount of physical activity.;Short Term: Attend rehab on a regular basis to increase amount of physical activity.   Increase Strength and Stamina  Yes  Yes  Yes  Yes  Yes   Intervention  Provide advice, education, support and counseling about physical activity/exercise needs.;Develop an individualized exercise prescription for aerobic and resistive training based on initial evaluation findings, risk stratification, comorbidities and participant's personal  goals.  Provide advice, education, support and counseling about physical activity/exercise needs.;Develop an individualized  exercise prescription for aerobic and resistive training based on initial evaluation findings, risk stratification, comorbidities and participant's personal goals.  Provide advice, education, support and counseling about physical activity/exercise needs.;Develop an individualized exercise prescription for aerobic and resistive training based on initial evaluation findings, risk stratification, comorbidities and participant's personal goals.  Provide advice, education, support and counseling about physical activity/exercise needs.;Develop an individualized exercise prescription for aerobic and resistive training based on initial evaluation findings, risk stratification, comorbidities and participant's personal goals.  Provide advice, education, support and counseling about physical activity/exercise needs.;Develop an individualized exercise prescription for aerobic and resistive training based on initial evaluation findings, risk stratification, comorbidities and participant's personal goals.   Expected Outcomes  Short Term: Increase workloads from initial exercise prescription for resistance, speed, and METs.;Short Term: Perform resistance training exercises routinely during rehab and add in resistance training at home;Long Term: Improve cardiorespiratory fitness, muscular endurance and strength as measured by increased METs and functional capacity (6MWT)  Short Term: Increase workloads from initial exercise prescription for resistance, speed, and METs.;Short Term: Perform resistance training exercises routinely during rehab and add in resistance training at home;Long Term: Improve cardiorespiratory fitness, muscular endurance and strength as measured by increased METs and functional capacity (6MWT)  Short Term: Increase workloads from initial exercise prescription for resistance, speed, and  METs.;Short Term: Perform resistance training exercises routinely during rehab and add in resistance training at home;Long Term: Improve cardiorespiratory fitness, muscular endurance and strength as measured by increased METs and functional capacity (6MWT)  Short Term: Increase workloads from initial exercise prescription for resistance, speed, and METs.;Short Term: Perform resistance training exercises routinely during rehab and add in resistance training at home;Long Term: Improve cardiorespiratory fitness, muscular endurance and strength as measured by increased METs and functional capacity (6MWT)  Short Term: Increase workloads from initial exercise prescription for resistance, speed, and METs.;Short Term: Perform resistance training exercises routinely during rehab and add in resistance training at home;Long Term: Improve cardiorespiratory fitness, muscular endurance and strength as measured by increased METs and functional capacity (6MWT)   Able to understand and use rate of perceived exertion (RPE) scale  Yes  Yes  Yes  Yes  Yes   Intervention  Provide education and explanation on how to use RPE scale  Provide education and explanation on how to use RPE scale  Provide education and explanation on how to use RPE scale  Provide education and explanation on how to use RPE scale  Provide education and explanation on how to use RPE scale   Expected Outcomes  Short Term: Able to use RPE daily in rehab to express subjective intensity level;Long Term:  Able to use RPE to guide intensity level when exercising independently  Short Term: Able to use RPE daily in rehab to express subjective intensity level;Long Term:  Able to use RPE to guide intensity level when exercising independently  Short Term: Able to use RPE daily in rehab to express subjective intensity level;Long Term:  Able to use RPE to guide intensity level when exercising independently  Short Term: Able to use RPE daily in rehab to express subjective  intensity level;Long Term:  Able to use RPE to guide intensity level when exercising independently  Short Term: Able to use RPE daily in rehab to express subjective intensity level;Long Term:  Able to use RPE to guide intensity level when exercising independently   Able to understand and use Dyspnea scale  Yes  Yes  Yes  Yes  Yes   Intervention  Provide  education and explanation on how to use Dyspnea scale  Provide education and explanation on how to use Dyspnea scale  Provide education and explanation on how to use Dyspnea scale  Provide education and explanation on how to use Dyspnea scale  Provide education and explanation on how to use Dyspnea scale   Expected Outcomes  Short Term: Able to use Dyspnea scale daily in rehab to express subjective sense of shortness of breath during exertion;Long Term: Able to use Dyspnea scale to guide intensity level when exercising independently  Short Term: Able to use Dyspnea scale daily in rehab to express subjective sense of shortness of breath during exertion;Long Term: Able to use Dyspnea scale to guide intensity level when exercising independently  Short Term: Able to use Dyspnea scale daily in rehab to express subjective sense of shortness of breath during exertion;Long Term: Able to use Dyspnea scale to guide intensity level when exercising independently  Short Term: Able to use Dyspnea scale daily in rehab to express subjective sense of shortness of breath during exertion;Long Term: Able to use Dyspnea scale to guide intensity level when exercising independently  Short Term: Able to use Dyspnea scale daily in rehab to express subjective sense of shortness of breath during exertion;Long Term: Able to use Dyspnea scale to guide intensity level when exercising independently   Knowledge and understanding of Target Heart Rate Range (THRR)  Yes  Yes  Yes  Yes  Yes   Intervention  --  --  --  --  Provide education and explanation of THRR including how the numbers were  predicted and where they are located for reference   Expected Outcomes  Short Term: Able to state/look up THRR;Short Term: Able to use daily as guideline for intensity in rehab;Long Term: Able to use THRR to govern intensity when exercising independently  Short Term: Able to state/look up THRR;Short Term: Able to use daily as guideline for intensity in rehab;Long Term: Able to use THRR to govern intensity when exercising independently  Short Term: Able to state/look up THRR;Short Term: Able to use daily as guideline for intensity in rehab;Long Term: Able to use THRR to govern intensity when exercising independently  Short Term: Able to state/look up THRR;Short Term: Able to use daily as guideline for intensity in rehab;Long Term: Able to use THRR to govern intensity when exercising independently  Short Term: Able to state/look up THRR;Short Term: Able to use daily as guideline for intensity in rehab;Long Term: Able to use THRR to govern intensity when exercising independently   Understanding of Exercise Prescription  Yes  Yes  Yes  Yes  Yes   Intervention  Provide education, explanation, and written materials on patient's individual exercise prescription  Provide education, explanation, and written materials on patient's individual exercise prescription  Provide education, explanation, and written materials on patient's individual exercise prescription  Provide education, explanation, and written materials on patient's individual exercise prescription  Provide education, explanation, and written materials on patient's individual exercise prescription   Expected Outcomes  Short Term: Able to explain program exercise prescription;Long Term: Able to explain home exercise prescription to exercise independently  Short Term: Able to explain program exercise prescription;Long Term: Able to explain home exercise prescription to exercise independently  Short Term: Able to explain program exercise prescription;Long Term:  Able to explain home exercise prescription to exercise independently  Short Term: Able to explain program exercise prescription;Long Term: Able to explain home exercise prescription to exercise independently  Short Term: Able to explain  program exercise prescription;Long Term: Able to explain home exercise prescription to exercise independently      Exercise Goals Re-Evaluation: Exercise Goals Re-Evaluation    Row Name 10/29/18 0750 11/27/18 0723 12/25/18 0736 01/21/19 1046       Exercise Goal Re-Evaluation   Exercise Goals Review  Increase Physical Activity;Increase Strength and Stamina;Able to understand and use Dyspnea scale;Able to understand and use rate of perceived exertion (RPE) scale;Knowledge and understanding of Target Heart Rate Range (THRR);Understanding of Exercise Prescription  Increase Physical Activity;Increase Strength and Stamina;Able to understand and use Dyspnea scale;Able to understand and use rate of perceived exertion (RPE) scale;Knowledge and understanding of Target Heart Rate Range (THRR);Understanding of Exercise Prescription  Increase Physical Activity;Increase Strength and Stamina;Able to understand and use Dyspnea scale;Able to understand and use rate of perceived exertion (RPE) scale;Knowledge and understanding of Target Heart Rate Range (THRR);Understanding of Exercise Prescription  Increase Physical Activity;Increase Strength and Stamina;Able to understand and use Dyspnea scale;Able to understand and use rate of perceived exertion (RPE) scale;Knowledge and understanding of Target Heart Rate Range (THRR);Understanding of Exercise Prescription    Comments  Pt has completed 2 exercise sessions. Pt's oxygen saturations have been a concern. Pt has desaturated on 4L, 6L, and 8L while walking on the treadmill. Pt's saturation levels take several minutes to recover even when his liter flow has been increased. Pt now walks on the treadmill at 10 L and on the stepper at 8L. If pt  desaturates on 10L, we will have to remove him from the treadmill and put him on another modality. Pt currently exercises at 2.4 METs on the stepper. Will continue to monitor and progress as able.  Pt has completed 10 exercise sessions. We have been able to keep his saturations at 88 and above on the treadmill while exercising on 10L. Pt currently exercises at 3.5 METs on the treadmill and 2.9 METs on the stepper. Pt has recently been exposed to Covid, so he will be out for atleast the next two weeks. Pt shows positive attitude and willing to accept workload increases. Will continue to monitor and progress as able.  Pt has completed 14 exercise sessions. Pt currently exercises at 3.83 MEts on the treadmill and 3.1 METs on the stepper. Pt is working hard in order to get a liver transplant. We will continue to work with him so he can get this transplant. Pt is accepting of workload increases. Will continue to monitor and progress as able.  Pt has completed 20 exercise sessions. Pt currently exercises at 4.07 METs on the TM and 3.0 METs on the stepper. Pt continues to work hard and increase workloads. Will continue to monitor and progress as able.    Expected Outcomes  Through exercise at rehab and at home, the patient will decrease shortness of breath with daily activities and feel confident in carrying out an exercise regime at home.  Through exercise at rehab and at home, the patient will decrease shortness of breath with daily activities and feel confident in carrying out an exercise regime at home.  Through exercise at rehab and at home, the patient will decrease shortness of breath with daily activities and feel confident in carrying out an exercise regime at home.  Through exercise at rehab and at home, the patient will decrease shortness of breath with daily activities and feel confident in carrying out an exercise regime at home.       Nutrition & Weight - Outcomes: Pre Biometrics - 10/18/18 5364  Pre Biometrics   Grip Strength  32 kg        Nutrition:   Nutrition Discharge:   Education Questionnaire Score: Knowledge Questionnaire Score - 01/31/19 1057      Knowledge Questionnaire Score   Post Score  16/18       Goals reviewed with patient; copy given to patient.

## 2019-02-07 ENCOUNTER — Ambulatory Visit: Payer: 59

## 2019-02-08 DIAGNOSIS — R972 Elevated prostate specific antigen [PSA]: Secondary | ICD-10-CM

## 2019-02-08 HISTORY — DX: Elevated prostate specific antigen (PSA): R97.20

## 2019-02-11 ENCOUNTER — Other Ambulatory Visit: Payer: Self-pay

## 2019-02-11 ENCOUNTER — Inpatient Hospital Stay: Payer: 59 | Attending: Hematology

## 2019-02-11 VITALS — BP 127/50 | HR 64 | Temp 98.9°F | Resp 18

## 2019-02-11 DIAGNOSIS — K922 Gastrointestinal hemorrhage, unspecified: Secondary | ICD-10-CM | POA: Diagnosis not present

## 2019-02-11 DIAGNOSIS — D500 Iron deficiency anemia secondary to blood loss (chronic): Secondary | ICD-10-CM | POA: Diagnosis not present

## 2019-02-11 MED ORDER — OCTREOTIDE ACETATE 30 MG IM KIT
30.0000 mg | PACK | Freq: Once | INTRAMUSCULAR | Status: AC
  Administered 2019-02-11: 30 mg via INTRAMUSCULAR

## 2019-02-11 MED ORDER — OCTREOTIDE ACETATE 30 MG IM KIT
PACK | INTRAMUSCULAR | Status: AC
  Filled 2019-02-11: qty 1

## 2019-02-11 NOTE — Patient Instructions (Signed)
Octreotide injection solution What is this medicine? OCTREOTIDE (ok TREE oh tide) is used to reduce blood levels of growth hormone in patients with a condition called acromegaly. This medicine also reduces flushing and watery diarrhea caused by certain types of cancer. This medicine may be used for other purposes; ask your health care provider or pharmacist if you have questions. COMMON BRAND NAME(S): Leatha Gilding, Sandostatin What should I tell my health care provider before I take this medicine? They need to know if you have any of these conditions:  diabetes  gallbladder disease  kidney disease  liver disease  thyroid disease  an unusual or allergic reaction to octreotide, other medicines, foods, dyes, or preservatives  pregnant or trying to get pregnant  breast-feeding How should I use this medicine? This medicine is for injection under the skin or into a vein (only in emergency situations). It is usually given by a health care professional in a hospital or clinic setting. If you get this medicine at home, you will be taught how to prepare and give this medicine. Allow the injection solution to come to room temperature before use. Do not warm it artificially. Use exactly as directed. Take your medicine at regular intervals. Do not take your medicine more often than directed. It is important that you put your used needles and syringes in a special sharps container. Do not put them in a trash can. If you do not have a sharps container, call your pharmacist or healthcare provider to get one. Talk to your pediatrician regarding the use of this medicine in children. Special care may be needed. Overdosage: If you think you have taken too much of this medicine contact a poison control center or emergency room at once. NOTE: This medicine is only for you. Do not share this medicine with others. What if I miss a dose? If you miss a dose, take it as soon as you can. If it is almost time for your  next dose, take only that dose. Do not take double or extra doses. What may interact with this medicine?  bromocriptine  certain medicines for blood pressure, heart disease, irregular heartbeat  cyclosporine  diuretics  medicines for diabetes, including insulin  quinidine This list may not describe all possible interactions. Give your health care provider a list of all the medicines, herbs, non-prescription drugs, or dietary supplements you use. Also tell them if you smoke, drink alcohol, or use illegal drugs. Some items may interact with your medicine. What should I watch for while using this medicine? Visit your doctor or health care professional for regular checks on your progress. To help reduce irritation at the injection site, use a different site for each injection and make sure the solution is at room temperature before use. This medicine may cause decreases in blood sugar. Signs of low blood sugar include chills, cool, pale skin or cold sweats, drowsiness, extreme hunger, fast heartbeat, headache, nausea, nervousness or anxiety, shakiness, trembling, unsteadiness, tiredness, or weakness. Contact your doctor or health care professional right away if you experience any of these symptoms. This medicine may increase blood sugar. Ask your healthcare provider if changes in diet or medicines are needed if you have diabetes. This medicine may cause a decrease in vitamin B12. You should make sure that you get enough vitamin B12 while you are taking this medicine. Discuss the foods you eat and the vitamins you take with your health care professional. What side effects may I notice from receiving this medicine? Side  effects that you should report to your doctor or health care professional as soon as possible:  allergic reactions like skin rash, itching or hives, swelling of the face, lips, or tongue  fast, slow, or irregular heartbeat  right upper belly pain  severe stomach pain  signs  and symptoms of high blood sugar such as being more thirsty or hungry or having to urinate more than normal. You may also feel very tired or have blurry vision.  signs and symptoms of low blood sugar such as feeling anxious; confusion; dizziness; increased hunger; unusually weak or tired; increased sweating; shakiness; cold, clammy skin; irritable; headache; blurred vision; fast heartbeat; loss of consciousness  unusually weak or tired Side effects that usually do not require medical attention (report to your doctor or health care professional if they continue or are bothersome):  diarrhea  dizziness  gas  headache  nausea, vomiting  pain, redness, or irritation at site where injected  upset stomach This list may not describe all possible side effects. Call your doctor for medical advice about side effects. You may report side effects to FDA at 1-800-FDA-1088. Where should I keep my medicine? Keep out of the reach of children. Store in a refrigerator between 2 and 8 degrees C (36 and 46 degrees F). Protect from light. Allow to come to room temperature naturally. Do not use artificial heat. If protected from light, the injection may be stored at room temperature between 20 and 30 degrees C (70 and 86 degrees F) for 14 days. After the initial use, throw away any unused portion of a multiple dose vial after 14 days. Throw away unused portions of the ampules after use. NOTE: This sheet is a summary. It may not cover all possible information. If you have questions about this medicine, talk to your doctor, pharmacist, or health care provider.  2020 Elsevier/Gold Standard (2018-08-23 13:33:09)

## 2019-02-12 DIAGNOSIS — J849 Interstitial pulmonary disease, unspecified: Secondary | ICD-10-CM | POA: Diagnosis not present

## 2019-02-12 DIAGNOSIS — Z85828 Personal history of other malignant neoplasm of skin: Secondary | ICD-10-CM | POA: Diagnosis not present

## 2019-02-12 DIAGNOSIS — Z01818 Encounter for other preprocedural examination: Secondary | ICD-10-CM | POA: Diagnosis not present

## 2019-02-12 DIAGNOSIS — K7581 Nonalcoholic steatohepatitis (NASH): Secondary | ICD-10-CM | POA: Diagnosis not present

## 2019-02-12 DIAGNOSIS — R0902 Hypoxemia: Secondary | ICD-10-CM | POA: Diagnosis not present

## 2019-02-12 DIAGNOSIS — K7460 Unspecified cirrhosis of liver: Secondary | ICD-10-CM | POA: Diagnosis not present

## 2019-02-15 ENCOUNTER — Other Ambulatory Visit: Payer: Self-pay | Admitting: Family Medicine

## 2019-02-15 MED FILL — METOPROLOL TARTRATE 25 MG T: 25 | 90 days supply | Qty: 90 | Fill #0

## 2019-02-21 DIAGNOSIS — Z01818 Encounter for other preprocedural examination: Secondary | ICD-10-CM | POA: Diagnosis not present

## 2019-02-27 ENCOUNTER — Encounter: Payer: Self-pay | Admitting: Family Medicine

## 2019-03-01 ENCOUNTER — Other Ambulatory Visit: Payer: Self-pay | Admitting: Hematology

## 2019-03-01 DIAGNOSIS — J181 Lobar pneumonia, unspecified organism: Secondary | ICD-10-CM | POA: Diagnosis not present

## 2019-03-01 MED FILL — TRANEXAMIC ACID 650 MG TAB: 650 | 30 days supply | Qty: 60 | Fill #0

## 2019-03-01 NOTE — Telephone Encounter (Signed)
Refill request

## 2019-03-07 ENCOUNTER — Ambulatory Visit
Admission: RE | Admit: 2019-03-07 | Discharge: 2019-03-07 | Disposition: A | Discharge status: Home / Self Care | Payer: 59 | Source: Ambulatory Visit | Attending: Family Medicine | Admitting: Family Medicine

## 2019-03-07 ENCOUNTER — Inpatient Hospital Stay: Payer: 59

## 2019-03-07 ENCOUNTER — Encounter: Payer: Self-pay | Admitting: Family Medicine

## 2019-03-07 ENCOUNTER — Telehealth: Payer: Self-pay | Admitting: *Deleted

## 2019-03-07 ENCOUNTER — Other Ambulatory Visit: Payer: Self-pay

## 2019-03-07 VITALS — BP 112/55 | HR 58 | Temp 97.9°F | Resp 16

## 2019-03-07 DIAGNOSIS — Z01818 Encounter for other preprocedural examination: Secondary | ICD-10-CM

## 2019-03-07 DIAGNOSIS — D500 Iron deficiency anemia secondary to blood loss (chronic): Secondary | ICD-10-CM

## 2019-03-07 DIAGNOSIS — K769 Liver disease, unspecified: Secondary | ICD-10-CM

## 2019-03-07 DIAGNOSIS — K7460 Unspecified cirrhosis of liver: Secondary | ICD-10-CM

## 2019-03-07 DIAGNOSIS — Z13820 Encounter for screening for osteoporosis: Secondary | ICD-10-CM | POA: Diagnosis not present

## 2019-03-07 DIAGNOSIS — K922 Gastrointestinal hemorrhage, unspecified: Secondary | ICD-10-CM | POA: Diagnosis not present

## 2019-03-07 HISTORY — PX: OTHER SURGICAL HISTORY: SHX169

## 2019-03-07 LAB — CBC WITH DIFFERENTIAL/PLATELET
Abs Immature Granulocytes: 0.02 10*3/uL (ref 0.00–0.07)
Basophils Absolute: 0.1 10*3/uL (ref 0.0–0.1)
Basophils Relative: 2 %
Eosinophils Absolute: 0.6 10*3/uL — ABNORMAL HIGH (ref 0.0–0.5)
Eosinophils Relative: 12 %
HCT: 35.5 % — ABNORMAL LOW (ref 39.0–52.0)
Hemoglobin: 11.8 g/dL — ABNORMAL LOW (ref 13.0–17.0)
Immature Granulocytes: 0 %
Lymphocytes Relative: 15 %
Lymphs Abs: 0.8 10*3/uL (ref 0.7–4.0)
MCH: 32 pg (ref 26.0–34.0)
MCHC: 33.2 g/dL (ref 30.0–36.0)
MCV: 96.2 fL (ref 80.0–100.0)
Monocytes Absolute: 0.8 10*3/uL (ref 0.1–1.0)
Monocytes Relative: 15 %
Neutro Abs: 2.9 10*3/uL (ref 1.7–7.7)
Neutrophils Relative %: 56 %
Platelets: 75 10*3/uL — ABNORMAL LOW (ref 150–400)
RBC: 3.69 MIL/uL — ABNORMAL LOW (ref 4.22–5.81)
RDW: 15.2 % (ref 11.5–15.5)
WBC: 5.2 10*3/uL (ref 4.0–10.5)
nRBC: 0 % (ref 0.0–0.2)

## 2019-03-07 LAB — IRON AND TIBC
Iron: 61 ug/dL (ref 42–163)
Saturation Ratios: 20 % (ref 20–55)
TIBC: 298 ug/dL (ref 202–409)
UIBC: 237 ug/dL (ref 117–376)

## 2019-03-07 LAB — CMP (CANCER CENTER ONLY)
ALT: 27 U/L (ref 0–44)
AST: 33 U/L (ref 15–41)
Albumin: 2.9 g/dL — ABNORMAL LOW (ref 3.5–5.0)
Alkaline Phosphatase: 131 U/L — ABNORMAL HIGH (ref 38–126)
Anion gap: 10 (ref 5–15)
BUN: 14 mg/dL (ref 8–23)
CO2: 22 mmol/L (ref 22–32)
Calcium: 8 mg/dL — ABNORMAL LOW (ref 8.9–10.3)
Chloride: 107 mmol/L (ref 98–111)
Creatinine: 1.38 mg/dL — ABNORMAL HIGH (ref 0.61–1.24)
GFR, Est AFR Am: >60 mL/min (ref >60)
GFR, Estimated: 52 mL/min — ABNORMAL LOW (ref >60)
Glucose, Bld: 112 mg/dL — ABNORMAL HIGH (ref 70–99)
Potassium: 3.9 mmol/L (ref 3.5–5.1)
Sodium: 139 mmol/L (ref 135–145)
Total Bilirubin: 7.6 mg/dL (ref 0.3–1.2)
Total Protein: 6.1 g/dL — ABNORMAL LOW (ref 6.5–8.1)

## 2019-03-07 LAB — FERRITIN: Ferritin: 165 ng/mL (ref 24–336)

## 2019-03-07 MED ORDER — SODIUM CHLORIDE 0.9 % IV SOLN
INTRAVENOUS | Status: DC
  Filled 2019-03-07: qty 250

## 2019-03-07 MED ORDER — SODIUM CHLORIDE 0.9 % IV SOLN
750.0000 mg | Freq: Once | INTRAVENOUS | Status: AC
  Administered 2019-03-07: 750 mg via INTRAVENOUS
  Filled 2019-03-07: qty 15

## 2019-03-07 NOTE — Patient Instructions (Signed)
Ferric carboxymaltose injection What is this medicine? FERRIC CARBOXYMALTOSE (ferr-ik car-box-ee-mol-toes) is an iron complex. Iron is used to make healthy red blood cells, which carry oxygen and nutrients throughout the body. This medicine is used to treat anemia in people with chronic kidney disease or people who cannot take iron by mouth. This medicine may be used for other purposes; ask your health care provider or pharmacist if you have questions. COMMON BRAND NAME(S): Injectafer What should I tell my health care provider before I take this medicine? They need to know if you have any of these conditions:  high levels of iron in the blood  liver disease  an unusual or allergic reaction to iron, other medicines, foods, dyes, or preservatives  pregnant or trying to get pregnant  breast-feeding How should I use this medicine? This medicine is for infusion into a vein. It is given by a health care professional in a hospital or clinic setting. Talk to your pediatrician regarding the use of this medicine in children. Special care may be needed. Overdosage: If you think you have taken too much of this medicine contact a poison control center or emergency room at once. NOTE: This medicine is only for you. Do not share this medicine with others. What if I miss a dose? It is important not to miss your dose. Call your doctor or health care professional if you are unable to keep an appointment. What may interact with this medicine? Do not take this medicine with any of the following medications:  deferoxamine  dimercaprol  other iron products This list may not describe all possible interactions. Give your health care provider a list of all the medicines, herbs, non-prescription drugs, or dietary supplements you use. Also tell them if you smoke, drink alcohol, or use illegal drugs. Some items may interact with your medicine. What should I watch for while using this medicine? Visit your  doctor or health care professional regularly. Tell your doctor if your symptoms do not start to get better or if they get worse. You may need blood work done while you are taking this medicine. You may need to follow a special diet. Talk to your doctor. Foods that contain iron include: whole grains/cereals, dried fruits, beans, or peas, leafy green vegetables, and organ meats (liver, kidney). What side effects may I notice from receiving this medicine? Side effects that you should report to your doctor or health care professional as soon as possible:  allergic reactions like skin rash, itching or hives, swelling of the face, lips, or tongue  dizziness  facial flushing Side effects that usually do not require medical attention (report to your doctor or health care professional if they continue or are bothersome):  changes in taste  constipation  headache  nausea, vomiting  pain, redness, or irritation at site where injected This list may not describe all possible side effects. Call your doctor for medical advice about side effects. You may report side effects to FDA at 1-800-FDA-1088. Where should I keep my medicine? This drug is given in a hospital or clinic and will not be stored at home. NOTE: This sheet is a summary. It may not cover all possible information. If you have questions about this medicine, talk to your doctor, pharmacist, or health care provider.  2020 Elsevier/Gold Standard (2016-03-10 09:40:29)  Coronavirus (COVID-19) Are you at risk?  Are you at risk for the Coronavirus (COVID-19)?  To be considered HIGH RISK for Coronavirus (COVID-19), you have to meet the following   criteria:  . Traveled to [place], [place], [place], [place] or [place]; or in the [place] to [place], [place], [place], or [place]; and have fever, cough, and shortness of breath within the last 2 weeks of travel OR . Been in close contact with a person diagnosed with COVID-19 within the  last 2 weeks and have fever, cough, and shortness of breath . IF YOU DO NOT MEET THESE CRITERIA, YOU ARE CONSIDERED LOW RISK FOR COVID-19.  What to do if you are HIGH RISK for COVID-19?  . If you are having a medical emergency, call 911. . Seek medical care right away. Before you go to a doctor's office, urgent care or emergency department, call ahead and tell them about your recent travel, contact with someone diagnosed with COVID-19, and your symptoms. You should receive instructions from your physician's office regarding next steps of care.  . When you arrive at healthcare provider, tell the healthcare staff immediately you have returned from visiting [place], [place], [place], [place] or [place]; or traveled in the [place] to [place], [place], [place], or [place]; in the last two weeks or you have been in close contact with a person diagnosed with COVID-19 in the last 2 weeks.   . Tell the health care staff about your symptoms: fever, cough and shortness of breath. . After you have been seen by a medical provider, you will be either: o Tested for (COVID-19) and discharged home on quarantine except to seek medical care if symptoms worsen, and asked to  - Stay home and avoid contact with others until you get your results (4-5 days)  - Avoid travel on public transportation if possible (such as bus, train, or airplane) or o Sent to the Emergency Department by EMS for evaluation, COVID-19 testing, and possible admission depending on your condition and test results.  What to do if you are LOW RISK for COVID-19?  Reduce your risk of any infection by using the same precautions used for avoiding the common cold or flu:  . Wash your hands often with soap and warm water for at least 20 seconds.  If soap and water are not readily available, use an alcohol-based hand sanitizer with at least 60% alcohol.  . If coughing or sneezing, cover your mouth and nose by coughing or sneezing into the elbow  areas of your shirt or coat, into a tissue or into your sleeve (not your hands). . Avoid shaking hands with others and consider head nods or verbal greetings only. . Avoid touching your eyes, nose, or mouth with unwashed hands.  . Avoid close contact with people who are sick. . Avoid places or events with large numbers of people in one location, like concerts or sporting events. . Carefully consider travel plans you have or are making. . If you are planning any travel outside or inside the [place], visit the CDC's Travelers' Health webpage for the latest health notices. . If you have some symptoms but not all symptoms, continue to monitor at home and seek medical attention if your symptoms worsen. . If you are having a medical emergency, call 911.   ADDITIONAL HEALTHCARE OPTIONS FOR PATIENTS  [facility] / e-Visit: [email or web address]         [facility]: [phone]  [facility]: [phone]                   [facility]: [phone]   

## 2019-03-07 NOTE — Progress Notes (Signed)
Patient receiving only Injectafer today per patient request - his insurance will only cover Sandostatin once/calendar month. Injection appt will be rescheduled to early next week. Dr. Marilynne Halsted notified for future appointments.   Demetrius Charity, PharmD, [facility] Pharmacist Pharmacy Phone: [phone] 03/07/2019

## 2019-03-07 NOTE — Telephone Encounter (Signed)
CRITICAL VALUE STICKER CRITICAL VALUE: T bili 7.6 DATE & TIME NOTIFIED: 8:52 am  MESSENGER (representative from lab): Rosann Auerbach MD NOTIFIED: Dr. Irene Limbo  TIME OF NOTIFICATION:0915 RESPONSE: Acknowledged

## 2019-03-07 NOTE — Progress Notes (Signed)
Pt. was due for Sandostain today, but states he does not want to take as he has issues with his insurance covering it twice a month. Message sent to scheduling to reschedule injection for first week in February.  Demetrius Charity pharmacy sent message to Dr. Irene Limbo.

## 2019-03-08 DIAGNOSIS — Z23 Encounter for immunization: Secondary | ICD-10-CM | POA: Diagnosis not present

## 2019-03-15 ENCOUNTER — Other Ambulatory Visit: Payer: Self-pay | Admitting: Family Medicine

## 2019-03-15 MED FILL — PANTOPRAZOLE SOD DR 40 MG T: 40 | 90 days supply | Qty: 180 | Fill #0

## 2019-03-15 MED FILL — TORSEMIDE 20 MG TABLET: 20 | 90 days supply | Qty: 180 | Fill #0

## 2019-03-15 MED FILL — POTASSIUM CHLORIDE CRYS ER: 20 | 90 days supply | Qty: 180 | Fill #0

## 2019-03-15 MED FILL — SPIRONOLACTONE 25 MG TABS: 25 | 90 days supply | Qty: 45 | Fill #0

## 2019-03-18 ENCOUNTER — Inpatient Hospital Stay: Payer: 59 | Attending: Hematology

## 2019-03-18 ENCOUNTER — Other Ambulatory Visit: Payer: Self-pay

## 2019-03-18 VITALS — BP 122/57 | HR 60 | Temp 98.1°F | Resp 16

## 2019-03-18 DIAGNOSIS — K922 Gastrointestinal hemorrhage, unspecified: Secondary | ICD-10-CM | POA: Diagnosis not present

## 2019-03-18 DIAGNOSIS — D500 Iron deficiency anemia secondary to blood loss (chronic): Secondary | ICD-10-CM | POA: Insufficient documentation

## 2019-03-18 MED ORDER — OCTREOTIDE ACETATE 30 MG IM KIT
30.0000 mg | PACK | Freq: Once | INTRAMUSCULAR | Status: AC
  Administered 2019-03-18: 10:00:00 30 mg via INTRAMUSCULAR

## 2019-03-18 MED ORDER — OCTREOTIDE ACETATE 30 MG IM KIT
PACK | INTRAMUSCULAR | Status: AC
  Filled 2019-03-18: qty 1

## 2019-03-19 ENCOUNTER — Ambulatory Visit: Payer: 59

## 2019-03-19 MED FILL — GENERLAC 10 GM/15 ML SOLN: 10 | 30 days supply | Qty: 2700 | Fill #3

## 2019-03-21 ENCOUNTER — Ambulatory Visit: Payer: 59

## 2019-03-21 DIAGNOSIS — Z01818 Encounter for other preprocedural examination: Secondary | ICD-10-CM | POA: Diagnosis not present

## 2019-03-25 DIAGNOSIS — R918 Other nonspecific abnormal finding of lung field: Secondary | ICD-10-CM | POA: Diagnosis not present

## 2019-03-25 DIAGNOSIS — K219 Gastro-esophageal reflux disease without esophagitis: Secondary | ICD-10-CM | POA: Insufficient documentation

## 2019-03-25 DIAGNOSIS — Z20822 Contact with and (suspected) exposure to covid-19: Secondary | ICD-10-CM | POA: Diagnosis not present

## 2019-03-25 DIAGNOSIS — I714 Abdominal aortic aneurysm, without rupture: Secondary | ICD-10-CM | POA: Diagnosis not present

## 2019-03-25 DIAGNOSIS — N1832 Chronic kidney disease, stage 3b: Secondary | ICD-10-CM | POA: Diagnosis not present

## 2019-03-25 DIAGNOSIS — E785 Hyperlipidemia, unspecified: Secondary | ICD-10-CM | POA: Diagnosis not present

## 2019-03-25 DIAGNOSIS — Z951 Presence of aortocoronary bypass graft: Secondary | ICD-10-CM | POA: Diagnosis not present

## 2019-03-25 DIAGNOSIS — E1122 Type 2 diabetes mellitus with diabetic chronic kidney disease: Secondary | ICD-10-CM | POA: Diagnosis not present

## 2019-03-25 DIAGNOSIS — I129 Hypertensive chronic kidney disease with stage 1 through stage 4 chronic kidney disease, or unspecified chronic kidney disease: Secondary | ICD-10-CM | POA: Diagnosis not present

## 2019-03-25 DIAGNOSIS — Z7682 Awaiting organ transplant status: Secondary | ICD-10-CM | POA: Diagnosis not present

## 2019-03-25 DIAGNOSIS — I2510 Atherosclerotic heart disease of native coronary artery without angina pectoris: Secondary | ICD-10-CM | POA: Diagnosis not present

## 2019-04-01 DIAGNOSIS — Z01818 Encounter for other preprocedural examination: Secondary | ICD-10-CM | POA: Diagnosis not present

## 2019-04-01 DIAGNOSIS — J181 Lobar pneumonia, unspecified organism: Secondary | ICD-10-CM | POA: Diagnosis not present

## 2019-04-01 MED FILL — TRANEXAMIC ACID 650 MG TAB: 650 | 30 days supply | Qty: 60 | Fill #1

## 2019-04-02 ENCOUNTER — Encounter: Payer: Self-pay | Admitting: *Deleted

## 2019-04-02 ENCOUNTER — Other Ambulatory Visit: Payer: Self-pay | Admitting: *Deleted

## 2019-04-02 NOTE — Patient Outreach (Signed)
[name]) Care Management  04/02/2019  William [name] 12-Mar-1950 [id number]   Transition of care call/case closure   Referral received:04/02/19 Initial outreach:04/02/19 Insurance: [place] [employer]   Subjective: Initial successful telephone call to patient's preferred number in order to complete transition of care assessment; Person answering phone identified as patient wife William [name], Designated party release.  2 HIPAA identifiers verified. Explained purpose of call and completed transition of care assessment.  William [name] explained reason for patient recent inpatient visit at [facility], she reports that got a call regarding a Liver for transplant available for patient, all preop procedures completed and patient prepped for surgery. When kidney arrived it was noted that due to inflammation of kidney it would not be suitable for transplant. Patient was discharged home the next day. She discussed he was initially down regarding kidney not being suitable for transplant but understanding it was best in the long run. She states he is out enjoying the sunshine on today. She denies any problems with tolerating diet, he continues to monitor weights,  and blood pressures at home staying in good range. Wife helps patient with managing medications .   Spouse assisting with his recovery.   She states they do  have the hospital indemnity, and FMLA in place .  Patient with [place] disease management program with Active health management for chronic conditions.  She  uses a [facility] outpatient pharmacy.    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  Comorbidities include: Liver cirrhosis secondary to NASH, hypertension , heart failure , endovascular stent for abdominal aortic aneurysm, GI bleed, Iron deficiency anemia .  He  was discharged to home on  03/27/19 without the need for home health services or DME.   Assessment:  Patient voices good understanding of all discharge instructions.  See transition of care flowsheet for assessment details.   Plan:  Reviewed hospital discharge diagnosis of Liver transplant candidate   and discharge treatment plan using hospital discharge instructions, assessing medication adherence, reviewing problems requiring provider notification, and discussing the importance of follow up with  primary care provider and/or specialists as directed. Reviewed [facility] healthy lifestyle program information to receive discounted premium for  2022  Step 1: Get annual physical between February 07, 2018 and August 08, 2019; Step 2: Complete your health assessment between February 08, 2019 and October 09, 2019 at [email or web address] Step 3:Identify your current health status and complete the corresponding action step between January 1, and October 09, 2019.   Using [facility] website, verified that patient is an active participate in [place]'s Active Health Management chronic disease management program.    No ongoing care management needs identified so will close case to [facility] Management services and route successful outreach letter with [facility] Management pamphlet and 24 Hour Nurse Line Magnet to [facility] Management clinical pool to be mailed to patient's home address.    William Draft, RN, BSN  [facility] Management Coordinator  [phone]- Mobile [phone]- Toll Free Main Office

## 2019-04-04 ENCOUNTER — Ambulatory Visit: Payer: 59

## 2019-04-05 ENCOUNTER — Telehealth: Payer: Self-pay | Admitting: Hematology

## 2019-04-05 DIAGNOSIS — Z23 Encounter for immunization: Secondary | ICD-10-CM | POA: Diagnosis not present

## 2019-04-05 NOTE — Telephone Encounter (Signed)
Called pt per [facility] - left message with next appt date and time; inj appts added to reflect every 4 wks from last appt

## 2019-04-07 DIAGNOSIS — R918 Other nonspecific abnormal finding of lung field: Secondary | ICD-10-CM | POA: Diagnosis not present

## 2019-04-07 DIAGNOSIS — Z87891 Personal history of nicotine dependence: Secondary | ICD-10-CM | POA: Diagnosis not present

## 2019-04-07 DIAGNOSIS — E669 Obesity, unspecified: Secondary | ICD-10-CM | POA: Diagnosis not present

## 2019-04-07 DIAGNOSIS — Z7682 Awaiting organ transplant status: Secondary | ICD-10-CM | POA: Diagnosis not present

## 2019-04-07 DIAGNOSIS — Z951 Presence of aortocoronary bypass graft: Secondary | ICD-10-CM | POA: Diagnosis not present

## 2019-04-07 DIAGNOSIS — Z9889 Other specified postprocedural states: Secondary | ICD-10-CM | POA: Diagnosis not present

## 2019-04-07 DIAGNOSIS — K219 Gastro-esophageal reflux disease without esophagitis: Secondary | ICD-10-CM | POA: Diagnosis not present

## 2019-04-07 DIAGNOSIS — R8290 Unspecified abnormal findings in urine: Secondary | ICD-10-CM | POA: Diagnosis not present

## 2019-04-07 DIAGNOSIS — J849 Interstitial pulmonary disease, unspecified: Secondary | ICD-10-CM | POA: Diagnosis not present

## 2019-04-07 DIAGNOSIS — Z85828 Personal history of other malignant neoplasm of skin: Secondary | ICD-10-CM | POA: Diagnosis not present

## 2019-04-07 DIAGNOSIS — K7581 Nonalcoholic steatohepatitis (NASH): Secondary | ICD-10-CM | POA: Diagnosis not present

## 2019-04-07 LAB — COMPREHENSIVE METABOLIC PANEL
Albumin: 2.6 — AB (ref 3.5–5.0)
Calcium: 8.2 — AB (ref 8.7–10.7)

## 2019-04-07 LAB — HEPATIC FUNCTION PANEL
ALT: 26 (ref 10–40)
AST: 34 (ref 14–40)
Alkaline Phosphatase: 105 (ref 25–125)
Bilirubin, Total: 8.8

## 2019-04-07 LAB — BASIC METABOLIC PANEL
BUN: 8 (ref 4–21)
CO2: 22 (ref 13–22)
Chloride: 106 (ref 99–108)
Creatinine: 1.2 (ref 0.6–1.3)
Glucose: 104
Potassium: 3.8 (ref 3.4–5.3)
Sodium: 135 — AB (ref 137–147)

## 2019-04-07 LAB — CBC AND DIFFERENTIAL
HCT: 35 — AB (ref 41–53)
Hemoglobin: 12 — AB (ref 13.5–17.5)
Platelets: 58 — AB (ref 150–399)
WBC: 5.8

## 2019-04-07 LAB — CBC: RBC: 3.72 — AB (ref 3.87–5.11)

## 2019-04-08 ENCOUNTER — Encounter: Payer: Self-pay | Admitting: Family Medicine

## 2019-04-09 ENCOUNTER — Telehealth: Payer: Self-pay

## 2019-04-09 ENCOUNTER — Encounter: Payer: Self-pay | Admitting: Family Medicine

## 2019-04-09 DIAGNOSIS — R8290 Unspecified abnormal findings in urine: Secondary | ICD-10-CM

## 2019-04-09 NOTE — Telephone Encounter (Signed)
Patient had recent bloodwork, UA/culture done on 04/07/19 thru [facility] . Urine sample has possible contaminates and they would like the patient to have this rechecked to be sure. Placed on PCP desk for review, will notify patient with decision.  Please advise, thanks.

## 2019-04-09 NOTE — Telephone Encounter (Signed)
(  Labs reviewed.  Asymptomatic).  Ok to come in for lab visit to give clean catch urine sample.  Orders are in.-thx

## 2019-04-09 NOTE — Telephone Encounter (Signed)
Patient's wife, [name] advised regarding lab visit. Pt scheduled for tomorrow morning

## 2019-04-10 ENCOUNTER — Other Ambulatory Visit: Payer: Self-pay

## 2019-04-10 ENCOUNTER — Ambulatory Visit (INDEPENDENT_AMBULATORY_CARE_PROVIDER_SITE_OTHER): Payer: 59 | Admitting: Family Medicine

## 2019-04-10 DIAGNOSIS — R8290 Unspecified abnormal findings in urine: Secondary | ICD-10-CM

## 2019-04-10 LAB — URINALYSIS, ROUTINE W REFLEX MICROSCOPIC
Bilirubin Urine: NEGATIVE
Hgb urine dipstick: NEGATIVE
Ketones, ur: NEGATIVE
Leukocytes,Ua: NEGATIVE
Nitrite: NEGATIVE
RBC / HPF: NONE SEEN (ref 0–?)
Specific Gravity, Urine: 1.02 (ref 1.000–1.030)
Total Protein, Urine: NEGATIVE
Urine Glucose: NEGATIVE
Urobilinogen, UA: 0.2 (ref 0.0–1.0)
pH: 6 (ref 5.0–8.0)

## 2019-04-10 NOTE — Addendum Note (Signed)
Addended by: Ralph Dowdy on: 04/10/2019 09:53 AM   Modules accepted: Orders

## 2019-04-12 LAB — URINE CULTURE
MICRO NUMBER:: 10211661
Result:: NO GROWTH
SPECIMEN QUALITY:: ADEQUATE

## 2019-04-15 ENCOUNTER — Inpatient Hospital Stay: Payer: 59 | Attending: Hematology

## 2019-04-15 ENCOUNTER — Other Ambulatory Visit: Payer: Self-pay

## 2019-04-15 VITALS — BP 124/62 | HR 62 | Temp 98.2°F | Resp 18

## 2019-04-15 DIAGNOSIS — D500 Iron deficiency anemia secondary to blood loss (chronic): Secondary | ICD-10-CM | POA: Insufficient documentation

## 2019-04-15 DIAGNOSIS — K922 Gastrointestinal hemorrhage, unspecified: Secondary | ICD-10-CM | POA: Diagnosis not present

## 2019-04-15 MED ORDER — OCTREOTIDE ACETATE 30 MG IM KIT
30.0000 mg | PACK | Freq: Once | INTRAMUSCULAR | Status: AC
  Administered 2019-04-15: 30 mg via INTRAMUSCULAR

## 2019-04-15 MED ORDER — OCTREOTIDE ACETATE 30 MG IM KIT
PACK | INTRAMUSCULAR | Status: AC
  Filled 2019-04-15: qty 1

## 2019-04-15 NOTE — Patient Instructions (Signed)
Octreotide injection solution What is this medicine? OCTREOTIDE (ok TREE oh tide) is used to reduce blood levels of growth hormone in patients with a condition called acromegaly. This medicine also reduces flushing and watery diarrhea caused by certain types of cancer. This medicine may be used for other purposes; ask your health care provider or pharmacist if you have questions. COMMON BRAND NAME(S): Leatha Gilding, Sandostatin What should I tell my health care provider before I take this medicine? They need to know if you have any of these conditions:  diabetes  gallbladder disease  kidney disease  liver disease  thyroid disease  an unusual or allergic reaction to octreotide, other medicines, foods, dyes, or preservatives  pregnant or trying to get pregnant  breast-feeding How should I use this medicine? This medicine is for injection under the skin or into a vein (only in emergency situations). It is usually given by a health care professional in a hospital or clinic setting. If you get this medicine at home, you will be taught how to prepare and give this medicine. Allow the injection solution to come to room temperature before use. Do not warm it artificially. Use exactly as directed. Take your medicine at regular intervals. Do not take your medicine more often than directed. It is important that you put your used needles and syringes in a special sharps container. Do not put them in a trash can. If you do not have a sharps container, call your pharmacist or healthcare provider to get one. Talk to your pediatrician regarding the use of this medicine in children. Special care may be needed. Overdosage: If you think you have taken too much of this medicine contact a poison control center or emergency room at once. NOTE: This medicine is only for you. Do not share this medicine with others. What if I miss a dose? If you miss a dose, take it as soon as you can. If it is almost time for your  next dose, take only that dose. Do not take double or extra doses. What may interact with this medicine?  bromocriptine  certain medicines for blood pressure, heart disease, irregular heartbeat  cyclosporine  diuretics  medicines for diabetes, including insulin  quinidine This list may not describe all possible interactions. Give your health care provider a list of all the medicines, herbs, non-prescription drugs, or dietary supplements you use. Also tell them if you smoke, drink alcohol, or use illegal drugs. Some items may interact with your medicine. What should I watch for while using this medicine? Visit your doctor or health care professional for regular checks on your progress. To help reduce irritation at the injection site, use a different site for each injection and make sure the solution is at room temperature before use. This medicine may cause decreases in blood sugar. Signs of low blood sugar include chills, cool, pale skin or cold sweats, drowsiness, extreme hunger, fast heartbeat, headache, nausea, nervousness or anxiety, shakiness, trembling, unsteadiness, tiredness, or weakness. Contact your doctor or health care professional right away if you experience any of these symptoms. This medicine may increase blood sugar. Ask your healthcare provider if changes in diet or medicines are needed if you have diabetes. This medicine may cause a decrease in vitamin B12. You should make sure that you get enough vitamin B12 while you are taking this medicine. Discuss the foods you eat and the vitamins you take with your health care professional. What side effects may I notice from receiving this medicine? Side  effects that you should report to your doctor or health care professional as soon as possible:  allergic reactions like skin rash, itching or hives, swelling of the face, lips, or tongue  fast, slow, or irregular heartbeat  right upper belly pain  severe stomach pain  signs  and symptoms of high blood sugar such as being more thirsty or hungry or having to urinate more than normal. You may also feel very tired or have blurry vision.  signs and symptoms of low blood sugar such as feeling anxious; confusion; dizziness; increased hunger; unusually weak or tired; increased sweating; shakiness; cold, clammy skin; irritable; headache; blurred vision; fast heartbeat; loss of consciousness  unusually weak or tired Side effects that usually do not require medical attention (report to your doctor or health care professional if they continue or are bothersome):  diarrhea  dizziness  gas  headache  nausea, vomiting  pain, redness, or irritation at site where injected  upset stomach This list may not describe all possible side effects. Call your doctor for medical advice about side effects. You may report side effects to FDA at 1-800-FDA-1088. Where should I keep my medicine? Keep out of the reach of children. Store in a refrigerator between 2 and 8 degrees C (36 and 46 degrees F). Protect from light. Allow to come to room temperature naturally. Do not use artificial heat. If protected from light, the injection may be stored at room temperature between 20 and 30 degrees C (70 and 86 degrees F) for 14 days. After the initial use, throw away any unused portion of a multiple dose vial after 14 days. Throw away unused portions of the ampules after use. NOTE: This sheet is a summary. It may not cover all possible information. If you have questions about this medicine, talk to your doctor, pharmacist, or health care provider.  2020 Elsevier/Gold Standard (2018-08-23 13:33:09)

## 2019-04-23 DIAGNOSIS — C4492 Squamous cell carcinoma of skin, unspecified: Secondary | ICD-10-CM | POA: Diagnosis not present

## 2019-04-23 DIAGNOSIS — L570 Actinic keratosis: Secondary | ICD-10-CM | POA: Diagnosis not present

## 2019-04-23 DIAGNOSIS — L219 Seborrheic dermatitis, unspecified: Secondary | ICD-10-CM | POA: Diagnosis not present

## 2019-04-23 DIAGNOSIS — C44319 Basal cell carcinoma of skin of other parts of face: Secondary | ICD-10-CM | POA: Diagnosis not present

## 2019-04-25 ENCOUNTER — Telehealth: Payer: Self-pay | Admitting: Hematology

## 2019-04-25 NOTE — Telephone Encounter (Signed)
Rescheduled appts per Provider PAL. Pt's wife confirmed new appt times.

## 2019-04-29 DIAGNOSIS — J181 Lobar pneumonia, unspecified organism: Secondary | ICD-10-CM | POA: Diagnosis not present

## 2019-04-29 DIAGNOSIS — Z01818 Encounter for other preprocedural examination: Secondary | ICD-10-CM | POA: Diagnosis not present

## 2019-04-30 MED FILL — HYDROCORTISONE 2.5% CREAM: 2.5 | 14 days supply | Qty: 30 | Fill #1

## 2019-04-30 MED FILL — TRANEXAMIC ACID 650 MG TAB: 650 | 30 days supply | Qty: 60 | Fill #2

## 2019-05-02 ENCOUNTER — Inpatient Hospital Stay: Payer: 59

## 2019-05-02 ENCOUNTER — Other Ambulatory Visit: Payer: 59

## 2019-05-02 ENCOUNTER — Other Ambulatory Visit: Payer: Self-pay

## 2019-05-02 ENCOUNTER — Ambulatory Visit: Payer: 59 | Admitting: Hematology

## 2019-05-02 VITALS — BP 108/49 | HR 60 | Temp 97.9°F | Resp 18

## 2019-05-02 DIAGNOSIS — K922 Gastrointestinal hemorrhage, unspecified: Secondary | ICD-10-CM | POA: Diagnosis not present

## 2019-05-02 DIAGNOSIS — D500 Iron deficiency anemia secondary to blood loss (chronic): Secondary | ICD-10-CM

## 2019-05-02 LAB — CBC WITH DIFFERENTIAL/PLATELET
Abs Immature Granulocytes: 0.02 10*3/uL (ref 0.00–0.07)
Basophils Absolute: 0.1 10*3/uL (ref 0.0–0.1)
Basophils Relative: 2 %
Eosinophils Absolute: 0.6 10*3/uL — ABNORMAL HIGH (ref 0.0–0.5)
Eosinophils Relative: 11 %
HCT: 31.3 % — ABNORMAL LOW (ref 39.0–52.0)
Hemoglobin: 10.6 g/dL — ABNORMAL LOW (ref 13.0–17.0)
Immature Granulocytes: 0 %
Lymphocytes Relative: 14 %
Lymphs Abs: 0.7 10*3/uL (ref 0.7–4.0)
MCH: 32.4 pg (ref 26.0–34.0)
MCHC: 33.9 g/dL (ref 30.0–36.0)
MCV: 95.7 fL (ref 80.0–100.0)
Monocytes Absolute: 0.8 10*3/uL (ref 0.1–1.0)
Monocytes Relative: 16 %
Neutro Abs: 2.9 10*3/uL (ref 1.7–7.7)
Neutrophils Relative %: 57 %
Platelets: 74 10*3/uL — ABNORMAL LOW (ref 150–400)
RBC: 3.27 MIL/uL — ABNORMAL LOW (ref 4.22–5.81)
RDW: 16.4 % — ABNORMAL HIGH (ref 11.5–15.5)
WBC: 5.1 10*3/uL (ref 4.0–10.5)
nRBC: 0 % (ref 0.0–0.2)

## 2019-05-02 LAB — CMP (CANCER CENTER ONLY)
ALT: 25 U/L (ref 0–44)
AST: 31 U/L (ref 15–41)
Albumin: 2.6 g/dL — ABNORMAL LOW (ref 3.5–5.0)
Alkaline Phosphatase: 116 U/L (ref 38–126)
Anion gap: 9 (ref 5–15)
BUN: 11 mg/dL (ref 8–23)
CO2: 22 mmol/L (ref 22–32)
Calcium: 8 mg/dL — ABNORMAL LOW (ref 8.9–10.3)
Chloride: 105 mmol/L (ref 98–111)
Creatinine: 1.43 mg/dL — ABNORMAL HIGH (ref 0.61–1.24)
GFR, Est AFR Am: 58 mL/min — ABNORMAL LOW (ref >60)
GFR, Estimated: 50 mL/min — ABNORMAL LOW (ref >60)
Glucose, Bld: 108 mg/dL — ABNORMAL HIGH (ref 70–99)
Potassium: 4 mmol/L (ref 3.5–5.1)
Sodium: 136 mmol/L (ref 135–145)
Total Bilirubin: 8.1 mg/dL (ref 0.3–1.2)
Total Protein: 5.6 g/dL — ABNORMAL LOW (ref 6.5–8.1)

## 2019-05-02 LAB — IRON AND TIBC
Iron: 32 ug/dL — ABNORMAL LOW (ref 42–163)
Saturation Ratios: 12 % — ABNORMAL LOW (ref 20–55)
TIBC: 275 ug/dL (ref 202–409)
UIBC: 243 ug/dL (ref 117–376)

## 2019-05-02 LAB — FERRITIN: Ferritin: 119 ng/mL (ref 24–336)

## 2019-05-02 MED ORDER — OCTREOTIDE ACETATE 30 MG IM KIT: 30.0000 mg | PACK | Freq: Once | INTRAMUSCULAR | Status: DC

## 2019-05-02 MED ORDER — SODIUM CHLORIDE 0.9 % IV SOLN
INTRAVENOUS | Status: DC
  Filled 2019-05-02: qty 250

## 2019-05-02 MED ORDER — SODIUM CHLORIDE 0.9 % IV SOLN
750.0000 mg | Freq: Once | INTRAVENOUS | Status: AC
  Administered 2019-05-02: 750 mg via INTRAVENOUS
  Filled 2019-05-02: qty 15

## 2019-05-02 NOTE — Progress Notes (Signed)
CRITICAL VALUE STICKER  CRITICAL VALUE: Total Bil. 8.1  RECEIVER (on-site recipient of call): Jobe Igo, [place] NOTIFIED: 05/02/19 @ 0931  MESSENGER (representative from lab): Hillary  MD NOTIFIED: Dr. Lorenso Courier   TIME OF NOTIFICATION: 0211  RESPONSE: Acknowledged.

## 2019-05-02 NOTE — Patient Instructions (Signed)
Ferric carboxymaltose injection What is this medicine? FERRIC CARBOXYMALTOSE (ferr-ik car-box-ee-mol-toes) is an iron complex. Iron is used to make healthy red blood cells, which carry oxygen and nutrients throughout the body. This medicine is used to treat anemia in people with chronic kidney disease or people who cannot take iron by mouth. This medicine may be used for other purposes; ask your health care provider or pharmacist if you have questions. COMMON BRAND NAME(S): Injectafer What should I tell my health care provider before I take this medicine? They need to know if you have any of these conditions:  high levels of iron in the blood  liver disease  an unusual or allergic reaction to iron, other medicines, foods, dyes, or preservatives  pregnant or trying to get pregnant  breast-feeding How should I use this medicine? This medicine is for infusion into a vein. It is given by a health care professional in a hospital or clinic setting. Talk to your pediatrician regarding the use of this medicine in children. Special care may be needed. Overdosage: If you think you have taken too much of this medicine contact a poison control center or emergency room at once. NOTE: This medicine is only for you. Do not share this medicine with others. What if I miss a dose? It is important not to miss your dose. Call your doctor or health care professional if you are unable to keep an appointment. What may interact with this medicine? Do not take this medicine with any of the following medications:  deferoxamine  dimercaprol  other iron products This list may not describe all possible interactions. Give your health care provider a list of all the medicines, herbs, non-prescription drugs, or dietary supplements you use. Also tell them if you smoke, drink alcohol, or use illegal drugs. Some items may interact with your medicine. What should I watch for while using this medicine? Visit your  doctor or health care professional regularly. Tell your doctor if your symptoms do not start to get better or if they get worse. You may need blood work done while you are taking this medicine. You may need to follow a special diet. Talk to your doctor. Foods that contain iron include: whole grains/cereals, dried fruits, beans, or peas, leafy green vegetables, and organ meats (liver, kidney). What side effects may I notice from receiving this medicine? Side effects that you should report to your doctor or health care professional as soon as possible:  allergic reactions like skin rash, itching or hives, swelling of the face, lips, or tongue  dizziness  facial flushing Side effects that usually do not require medical attention (report to your doctor or health care professional if they continue or are bothersome):  changes in taste  constipation  headache  nausea, vomiting  pain, redness, or irritation at site where injected This list may not describe all possible side effects. Call your doctor for medical advice about side effects. You may report side effects to FDA at 1-800-FDA-1088. Where should I keep my medicine? This drug is given in a hospital or clinic and will not be stored at home. NOTE: This sheet is a summary. It may not cover all possible information. If you have questions about this medicine, talk to your doctor, pharmacist, or health care provider.  2020 Elsevier/Gold Standard (2016-03-10 09:40:29)  Coronavirus (COVID-19) Are you at risk?  Are you at risk for the Coronavirus (COVID-19)?  To be considered HIGH RISK for Coronavirus (COVID-19), you have to meet the following   criteria:  . Traveled to [place], [place], [place], [place] or [place]; or in the [place] to [place], [place], [place], or [place]; and have fever, cough, and shortness of breath within the last 2 weeks of travel OR . Been in close contact with a person diagnosed with COVID-19 within the  last 2 weeks and have fever, cough, and shortness of breath . IF YOU DO NOT MEET THESE CRITERIA, YOU ARE CONSIDERED LOW RISK FOR COVID-19.  What to do if you are HIGH RISK for COVID-19?  . If you are having a medical emergency, call 911. . Seek medical care right away. Before you go to a doctor's office, urgent care or emergency department, call ahead and tell them about your recent travel, contact with someone diagnosed with COVID-19, and your symptoms. You should receive instructions from your physician's office regarding next steps of care.  . When you arrive at healthcare provider, tell the healthcare staff immediately you have returned from visiting [place], [place], [place], [place] or [place]; or traveled in the [place] to [place], [place], [place], or [place]; in the last two weeks or you have been in close contact with a person diagnosed with COVID-19 in the last 2 weeks.   . Tell the health care staff about your symptoms: fever, cough and shortness of breath. . After you have been seen by a medical provider, you will be either: o Tested for (COVID-19) and discharged home on quarantine except to seek medical care if symptoms worsen, and asked to  - Stay home and avoid contact with others until you get your results (4-5 days)  - Avoid travel on public transportation if possible (such as bus, train, or airplane) or o Sent to the Emergency Department by EMS for evaluation, COVID-19 testing, and possible admission depending on your condition and test results.  What to do if you are LOW RISK for COVID-19?  Reduce your risk of any infection by using the same precautions used for avoiding the common cold or flu:  . Wash your hands often with soap and warm water for at least 20 seconds.  If soap and water are not readily available, use an alcohol-based hand sanitizer with at least 60% alcohol.  . If coughing or sneezing, cover your mouth and nose by coughing or sneezing into the elbow  areas of your shirt or coat, into a tissue or into your sleeve (not your hands). . Avoid shaking hands with others and consider head nods or verbal greetings only. . Avoid touching your eyes, nose, or mouth with unwashed hands.  . Avoid close contact with people who are sick. . Avoid places or events with large numbers of people in one location, like concerts or sporting events. . Carefully consider travel plans you have or are making. . If you are planning any travel outside or inside the [place], visit the CDC's Travelers' Health webpage for the latest health notices. . If you have some symptoms but not all symptoms, continue to monitor at home and seek medical attention if your symptoms worsen. . If you are having a medical emergency, call 911.   ADDITIONAL HEALTHCARE OPTIONS FOR PATIENTS  [facility] / e-Visit: [email or web address]         [facility]: [phone]  [facility]: [phone]                   [facility]: [phone]   

## 2019-05-07 DIAGNOSIS — Z01818 Encounter for other preprocedural examination: Secondary | ICD-10-CM | POA: Diagnosis not present

## 2019-05-13 ENCOUNTER — Other Ambulatory Visit: Payer: Self-pay

## 2019-05-13 ENCOUNTER — Inpatient Hospital Stay: Payer: 59 | Attending: Hematology

## 2019-05-13 VITALS — BP 110/58

## 2019-05-13 DIAGNOSIS — D500 Iron deficiency anemia secondary to blood loss (chronic): Secondary | ICD-10-CM | POA: Insufficient documentation

## 2019-05-13 DIAGNOSIS — K922 Gastrointestinal hemorrhage, unspecified: Secondary | ICD-10-CM | POA: Diagnosis not present

## 2019-05-13 MED ORDER — OCTREOTIDE ACETATE 30 MG IM KIT
30.0000 mg | PACK | Freq: Once | INTRAMUSCULAR | Status: AC
  Administered 2019-05-13: 30 mg via INTRAMUSCULAR

## 2019-05-13 MED ORDER — OCTREOTIDE ACETATE 30 MG IM KIT
PACK | INTRAMUSCULAR | Status: AC
  Filled 2019-05-13: qty 1

## 2019-05-13 NOTE — Patient Instructions (Signed)
Octreotide injection solution What is this medicine? OCTREOTIDE (ok TREE oh tide) is used to reduce blood levels of growth hormone in patients with a condition called acromegaly. This medicine also reduces flushing and watery diarrhea caused by certain types of cancer. This medicine may be used for other purposes; ask your health care provider or pharmacist if you have questions. COMMON BRAND NAME(S): Leatha Gilding, Sandostatin What should I tell my health care provider before I take this medicine? They need to know if you have any of these conditions:  diabetes  gallbladder disease  kidney disease  liver disease  thyroid disease  an unusual or allergic reaction to octreotide, other medicines, foods, dyes, or preservatives  pregnant or trying to get pregnant  breast-feeding How should I use this medicine? This medicine is for injection under the skin or into a vein (only in emergency situations). It is usually given by a health care professional in a hospital or clinic setting. If you get this medicine at home, you will be taught how to prepare and give this medicine. Allow the injection solution to come to room temperature before use. Do not warm it artificially. Use exactly as directed. Take your medicine at regular intervals. Do not take your medicine more often than directed. It is important that you put your used needles and syringes in a special sharps container. Do not put them in a trash can. If you do not have a sharps container, call your pharmacist or healthcare provider to get one. Talk to your pediatrician regarding the use of this medicine in children. Special care may be needed. Overdosage: If you think you have taken too much of this medicine contact a poison control center or emergency room at once. NOTE: This medicine is only for you. Do not share this medicine with others. What if I miss a dose? If you miss a dose, take it as soon as you can. If it is almost time for your  next dose, take only that dose. Do not take double or extra doses. What may interact with this medicine?  bromocriptine  certain medicines for blood pressure, heart disease, irregular heartbeat  cyclosporine  diuretics  medicines for diabetes, including insulin  quinidine This list may not describe all possible interactions. Give your health care provider a list of all the medicines, herbs, non-prescription drugs, or dietary supplements you use. Also tell them if you smoke, drink alcohol, or use illegal drugs. Some items may interact with your medicine. What should I watch for while using this medicine? Visit your doctor or health care professional for regular checks on your progress. To help reduce irritation at the injection site, use a different site for each injection and make sure the solution is at room temperature before use. This medicine may cause decreases in blood sugar. Signs of low blood sugar include chills, cool, pale skin or cold sweats, drowsiness, extreme hunger, fast heartbeat, headache, nausea, nervousness or anxiety, shakiness, trembling, unsteadiness, tiredness, or weakness. Contact your doctor or health care professional right away if you experience any of these symptoms. This medicine may increase blood sugar. Ask your healthcare provider if changes in diet or medicines are needed if you have diabetes. This medicine may cause a decrease in vitamin B12. You should make sure that you get enough vitamin B12 while you are taking this medicine. Discuss the foods you eat and the vitamins you take with your health care professional. What side effects may I notice from receiving this medicine? Side  effects that you should report to your doctor or health care professional as soon as possible:  allergic reactions like skin rash, itching or hives, swelling of the face, lips, or tongue  fast, slow, or irregular heartbeat  right upper belly pain  severe stomach pain  signs  and symptoms of high blood sugar such as being more thirsty or hungry or having to urinate more than normal. You may also feel very tired or have blurry vision.  signs and symptoms of low blood sugar such as feeling anxious; confusion; dizziness; increased hunger; unusually weak or tired; increased sweating; shakiness; cold, clammy skin; irritable; headache; blurred vision; fast heartbeat; loss of consciousness  unusually weak or tired Side effects that usually do not require medical attention (report to your doctor or health care professional if they continue or are bothersome):  diarrhea  dizziness  gas  headache  nausea, vomiting  pain, redness, or irritation at site where injected  upset stomach This list may not describe all possible side effects. Call your doctor for medical advice about side effects. You may report side effects to FDA at 1-800-FDA-1088. Where should I keep my medicine? Keep out of the reach of children. Store in a refrigerator between 2 and 8 degrees C (36 and 46 degrees F). Protect from light. Allow to come to room temperature naturally. Do not use artificial heat. If protected from light, the injection may be stored at room temperature between 20 and 30 degrees C (70 and 86 degrees F) for 14 days. After the initial use, throw away any unused portion of a multiple dose vial after 14 days. Throw away unused portions of the ampules after use. NOTE: This sheet is a summary. It may not cover all possible information. If you have questions about this medicine, talk to your doctor, pharmacist, or health care provider.  2020 Elsevier/Gold Standard (2018-08-23 13:33:09)

## 2019-05-20 MED FILL — METOPROLOL TARTRATE 25 MG T: 25 | 90 days supply | Qty: 90 | Fill #1

## 2019-05-21 DIAGNOSIS — R188 Other ascites: Secondary | ICD-10-CM | POA: Diagnosis not present

## 2019-05-21 DIAGNOSIS — Z7682 Awaiting organ transplant status: Secondary | ICD-10-CM | POA: Diagnosis not present

## 2019-05-21 DIAGNOSIS — R140 Abdominal distension (gaseous): Secondary | ICD-10-CM | POA: Diagnosis not present

## 2019-05-21 DIAGNOSIS — Z87891 Personal history of nicotine dependence: Secondary | ICD-10-CM | POA: Diagnosis not present

## 2019-05-21 DIAGNOSIS — K766 Portal hypertension: Secondary | ICD-10-CM | POA: Diagnosis not present

## 2019-05-21 DIAGNOSIS — Z79899 Other long term (current) drug therapy: Secondary | ICD-10-CM | POA: Diagnosis not present

## 2019-05-21 DIAGNOSIS — N62 Hypertrophy of breast: Secondary | ICD-10-CM | POA: Diagnosis not present

## 2019-05-21 DIAGNOSIS — K7581 Nonalcoholic steatohepatitis (NASH): Secondary | ICD-10-CM | POA: Diagnosis not present

## 2019-05-21 DIAGNOSIS — Z23 Encounter for immunization: Secondary | ICD-10-CM | POA: Diagnosis not present

## 2019-05-21 DIAGNOSIS — N6489 Other specified disorders of breast: Secondary | ICD-10-CM | POA: Diagnosis not present

## 2019-05-21 DIAGNOSIS — Z01818 Encounter for other preprocedural examination: Secondary | ICD-10-CM | POA: Diagnosis not present

## 2019-05-21 DIAGNOSIS — K7460 Unspecified cirrhosis of liver: Secondary | ICD-10-CM | POA: Diagnosis not present

## 2019-05-21 LAB — BASIC METABOLIC PANEL
BUN: 10 (ref 4–21)
CO2: 20 (ref 13–22)
Chloride: 103 (ref 99–108)
Creatinine: 1.3 (ref 0.6–1.3)
Glucose: 106
Potassium: 3.7 (ref 3.4–5.3)
Sodium: 135 — AB (ref 137–147)

## 2019-05-21 LAB — HEPATIC FUNCTION PANEL
ALT: 19 (ref 10–40)
AST: 29 (ref 14–40)
Alkaline Phosphatase: 92 (ref 25–125)
Bilirubin, Total: 11.7

## 2019-05-21 LAB — CBC AND DIFFERENTIAL
HCT: 39 — AB (ref 41–53)
Hemoglobin: 13.1 — AB (ref 13.5–17.5)
Neutrophils Absolute: 4
Platelets: 73 — AB (ref 150–399)
WBC: 5.7

## 2019-05-21 LAB — POCT INR: INR: 1.8 — AB (ref 0.9–1.1)

## 2019-05-21 LAB — COMPREHENSIVE METABOLIC PANEL
Albumin: 2.8 — AB (ref 3.5–5.0)
Calcium: 8.4 — AB (ref 8.7–10.7)

## 2019-05-21 LAB — PROTIME-INR: Protime: 21.8 — AB (ref 10.0–13.8)

## 2019-05-21 LAB — CBC: RBC: 3.99 (ref 3.87–5.11)

## 2019-05-24 ENCOUNTER — Telehealth: Payer: Self-pay | Admitting: *Deleted

## 2019-05-24 NOTE — Telephone Encounter (Signed)
Pt called. Liver transplant MD at [place] (Dr. Edison Pace) asked if Dr. Irene Limbo  would review her notes -- wants to know if patient can come off Lysteda? Dr. Irene Limbo informed of request Dr. Grier Mitts response:  Dr. Melony Overly note reviewed. If he needs to be off Lysteda to facilitate liver transplant consideration then it would be okay. He is stll having ongoing chronic GI bleeding. He might have increased GI bleeding off Lysteda that will need to be watched. Contacted patient with this information - spoke with patient's wife. She verbalized understanding.

## 2019-05-26 MED FILL — SPIRONOLACTONE 25 MG TABS: 25 | 90 days supply | Qty: 90 | Fill #0

## 2019-05-26 MED FILL — POTASSIUM CHLORIDE CRYS ER: 20 | 90 days supply | Qty: 90 | Fill #0

## 2019-05-28 DIAGNOSIS — K7290 Hepatic failure, unspecified without coma: Secondary | ICD-10-CM | POA: Diagnosis not present

## 2019-05-28 DIAGNOSIS — Z0389 Encounter for observation for other suspected diseases and conditions ruled out: Secondary | ICD-10-CM | POA: Diagnosis not present

## 2019-05-28 DIAGNOSIS — R918 Other nonspecific abnormal finding of lung field: Secondary | ICD-10-CM | POA: Diagnosis not present

## 2019-05-28 DIAGNOSIS — Z01818 Encounter for other preprocedural examination: Secondary | ICD-10-CM | POA: Diagnosis not present

## 2019-05-28 DIAGNOSIS — Z7682 Awaiting organ transplant status: Secondary | ICD-10-CM | POA: Diagnosis not present

## 2019-05-28 DIAGNOSIS — Z23 Encounter for immunization: Secondary | ICD-10-CM | POA: Diagnosis not present

## 2019-05-28 DIAGNOSIS — R928 Other abnormal and inconclusive findings on diagnostic imaging of breast: Secondary | ICD-10-CM | POA: Diagnosis not present

## 2019-05-30 ENCOUNTER — Ambulatory Visit: Payer: 59

## 2019-05-30 DIAGNOSIS — Z01818 Encounter for other preprocedural examination: Secondary | ICD-10-CM | POA: Diagnosis not present

## 2019-05-30 DIAGNOSIS — N6459 Other signs and symptoms in breast: Secondary | ICD-10-CM | POA: Diagnosis not present

## 2019-05-30 DIAGNOSIS — J181 Lobar pneumonia, unspecified organism: Secondary | ICD-10-CM | POA: Diagnosis not present

## 2019-05-30 MED FILL — GENERLAC 10 GM/15 ML SOLN: 10 | 31 days supply | Qty: 2838 | Fill #4

## 2019-06-04 ENCOUNTER — Encounter: Payer: Self-pay | Admitting: Family Medicine

## 2019-06-04 DIAGNOSIS — R928 Other abnormal and inconclusive findings on diagnostic imaging of breast: Secondary | ICD-10-CM | POA: Diagnosis not present

## 2019-06-04 DIAGNOSIS — N6031 Fibrosclerosis of right breast: Secondary | ICD-10-CM | POA: Diagnosis not present

## 2019-06-04 DIAGNOSIS — N62 Hypertrophy of breast: Secondary | ICD-10-CM | POA: Diagnosis not present

## 2019-06-04 DIAGNOSIS — N6311 Unspecified lump in the right breast, upper outer quadrant: Secondary | ICD-10-CM | POA: Diagnosis not present

## 2019-06-04 DIAGNOSIS — N6489 Other specified disorders of breast: Secondary | ICD-10-CM | POA: Diagnosis not present

## 2019-06-04 DIAGNOSIS — N6310 Unspecified lump in the right breast, unspecified quadrant: Secondary | ICD-10-CM | POA: Diagnosis not present

## 2019-06-10 ENCOUNTER — Inpatient Hospital Stay: Payer: 59 | Attending: Hematology

## 2019-06-10 ENCOUNTER — Other Ambulatory Visit: Payer: Self-pay

## 2019-06-10 VITALS — BP 118/72 | HR 62 | Temp 98.2°F | Resp 18

## 2019-06-10 DIAGNOSIS — D500 Iron deficiency anemia secondary to blood loss (chronic): Secondary | ICD-10-CM | POA: Diagnosis not present

## 2019-06-10 DIAGNOSIS — K922 Gastrointestinal hemorrhage, unspecified: Secondary | ICD-10-CM | POA: Diagnosis not present

## 2019-06-10 MED ORDER — OCTREOTIDE ACETATE 30 MG IM KIT
30.0000 mg | PACK | Freq: Once | INTRAMUSCULAR | Status: AC
  Administered 2019-06-10: 30 mg via INTRAMUSCULAR

## 2019-06-10 MED ORDER — OCTREOTIDE ACETATE 30 MG IM KIT
PACK | INTRAMUSCULAR | Status: AC
  Filled 2019-06-10: qty 1

## 2019-06-10 NOTE — Patient Instructions (Signed)
Octreotide injection solution What is this medicine? OCTREOTIDE (ok TREE oh tide) is used to reduce blood levels of growth hormone in patients with a condition called acromegaly. This medicine also reduces flushing and watery diarrhea caused by certain types of cancer. This medicine may be used for other purposes; ask your health care provider or pharmacist if you have questions. COMMON BRAND NAME(S): Leatha Gilding, Sandostatin What should I tell my health care provider before I take this medicine? They need to know if you have any of these conditions:  diabetes  gallbladder disease  kidney disease  liver disease  thyroid disease  an unusual or allergic reaction to octreotide, other medicines, foods, dyes, or preservatives  pregnant or trying to get pregnant  breast-feeding How should I use this medicine? This medicine is for injection under the skin or into a vein (only in emergency situations). It is usually given by a health care professional in a hospital or clinic setting. If you get this medicine at home, you will be taught how to prepare and give this medicine. Allow the injection solution to come to room temperature before use. Do not warm it artificially. Use exactly as directed. Take your medicine at regular intervals. Do not take your medicine more often than directed. It is important that you put your used needles and syringes in a special sharps container. Do not put them in a trash can. If you do not have a sharps container, call your pharmacist or healthcare provider to get one. Talk to your pediatrician regarding the use of this medicine in children. Special care may be needed. Overdosage: If you think you have taken too much of this medicine contact a poison control center or emergency room at once. NOTE: This medicine is only for you. Do not share this medicine with others. What if I miss a dose? If you miss a dose, take it as soon as you can. If it is almost time for your  next dose, take only that dose. Do not take double or extra doses. What may interact with this medicine?  bromocriptine  certain medicines for blood pressure, heart disease, irregular heartbeat  cyclosporine  diuretics  medicines for diabetes, including insulin  quinidine This list may not describe all possible interactions. Give your health care provider a list of all the medicines, herbs, non-prescription drugs, or dietary supplements you use. Also tell them if you smoke, drink alcohol, or use illegal drugs. Some items may interact with your medicine. What should I watch for while using this medicine? Visit your doctor or health care professional for regular checks on your progress. To help reduce irritation at the injection site, use a different site for each injection and make sure the solution is at room temperature before use. This medicine may cause decreases in blood sugar. Signs of low blood sugar include chills, cool, pale skin or cold sweats, drowsiness, extreme hunger, fast heartbeat, headache, nausea, nervousness or anxiety, shakiness, trembling, unsteadiness, tiredness, or weakness. Contact your doctor or health care professional right away if you experience any of these symptoms. This medicine may increase blood sugar. Ask your healthcare provider if changes in diet or medicines are needed if you have diabetes. This medicine may cause a decrease in vitamin B12. You should make sure that you get enough vitamin B12 while you are taking this medicine. Discuss the foods you eat and the vitamins you take with your health care professional. What side effects may I notice from receiving this medicine? Side  effects that you should report to your doctor or health care professional as soon as possible:  allergic reactions like skin rash, itching or hives, swelling of the face, lips, or tongue  fast, slow, or irregular heartbeat  right upper belly pain  severe stomach pain  signs  and symptoms of high blood sugar such as being more thirsty or hungry or having to urinate more than normal. You may also feel very tired or have blurry vision.  signs and symptoms of low blood sugar such as feeling anxious; confusion; dizziness; increased hunger; unusually weak or tired; increased sweating; shakiness; cold, clammy skin; irritable; headache; blurred vision; fast heartbeat; loss of consciousness  unusually weak or tired Side effects that usually do not require medical attention (report to your doctor or health care professional if they continue or are bothersome):  diarrhea  dizziness  gas  headache  nausea, vomiting  pain, redness, or irritation at site where injected  upset stomach This list may not describe all possible side effects. Call your doctor for medical advice about side effects. You may report side effects to FDA at 1-800-FDA-1088. Where should I keep my medicine? Keep out of the reach of children. Store in a refrigerator between 2 and 8 degrees C (36 and 46 degrees F). Protect from light. Allow to come to room temperature naturally. Do not use artificial heat. If protected from light, the injection may be stored at room temperature between 20 and 30 degrees C (70 and 86 degrees F) for 14 days. After the initial use, throw away any unused portion of a multiple dose vial after 14 days. Throw away unused portions of the ampules after use. NOTE: This sheet is a summary. It may not cover all possible information. If you have questions about this medicine, talk to your doctor, pharmacist, or health care provider.  2020 Elsevier/Gold Standard (2018-08-23 13:33:09)

## 2019-06-11 DIAGNOSIS — K760 Fatty (change of) liver, not elsewhere classified: Secondary | ICD-10-CM | POA: Diagnosis not present

## 2019-06-11 DIAGNOSIS — Z23 Encounter for immunization: Secondary | ICD-10-CM | POA: Diagnosis not present

## 2019-06-11 DIAGNOSIS — Z01818 Encounter for other preprocedural examination: Secondary | ICD-10-CM | POA: Diagnosis not present

## 2019-06-17 DIAGNOSIS — K7581 Nonalcoholic steatohepatitis (NASH): Secondary | ICD-10-CM | POA: Diagnosis not present

## 2019-06-17 DIAGNOSIS — I714 Abdominal aortic aneurysm, without rupture: Secondary | ICD-10-CM | POA: Diagnosis not present

## 2019-06-17 DIAGNOSIS — Z95828 Presence of other vascular implants and grafts: Secondary | ICD-10-CM | POA: Diagnosis not present

## 2019-06-17 DIAGNOSIS — K7460 Unspecified cirrhosis of liver: Secondary | ICD-10-CM | POA: Diagnosis not present

## 2019-06-17 MED FILL — PANTOPRAZOLE SOD DR 40 MG T: 40 | 90 days supply | Qty: 180 | Fill #1

## 2019-06-18 DIAGNOSIS — Z01818 Encounter for other preprocedural examination: Secondary | ICD-10-CM | POA: Diagnosis not present

## 2019-06-25 DIAGNOSIS — Z01818 Encounter for other preprocedural examination: Secondary | ICD-10-CM | POA: Diagnosis not present

## 2019-06-26 NOTE — Progress Notes (Signed)
HEMATOLOGY/ONCOLOGY CLINIC NOTE  Date of Service: 06/27/19  Patient Care Team: Tammi Sou, MD as PCP - General (Family Medicine) Troy Sine, MD as PCP - Cardiology (Cardiology) Franchot Gallo, MD as Consulting Physician (Urology) Troy Sine, MD as Consulting Physician (Cardiology) Brunetta Genera, MD as Consulting Physician (Hematology) [name], Gaspar Skeeters, MD as Consulting Physician (Gastroenterology) Carol Ada, MD as Consulting Physician (Gastroenterology) Rege, Dwain Sarna, MD as Consulting Physician (General Surgery) Lovenia Shuck, MD as Consulting Physician (Cardiothoracic Surgery) Blazing, Venia Carbon, MD as Consulting Physician (Cardiology) Teena Irani, MD as Consulting Physician (Internal Medicine)  CHIEF COMPLAINTS/PURPOSE OF CONSULTATION:   F/u for anemia and ongoing GI bleeding.  HISTORY OF PRESENTING ILLNESS:   William [name] is a wonderful 69 y.o. male who has been referred to [place] by Dr .Anitra Lauth, Adrian Blackwater, MD for evaluation and management of Anemia.  Patient has a history of extensive medical comorbidities including liver cirrhosis related to [facility] with portal hypertension, esophageal varices and portal hypertensive gastropathy and splenomegaly, iron deficiency anemia, obesity, diabetes, AAA.  Patient was admitted in April 2018 with acute blood loss anemia and required transfusion of multiple units of PRBCs with iron profile suggestive of iron deficiency. No overt evidence of hemolysis noted. He had an EGD and colonoscopy that did not show any overt bleeding. Capsule endoscopy was unrevealing but the capsule past and only 27 minutes.  He follows with Dr. [name] who is his gastric oncologist.  Patient was again readmitted in June 2018 with symptomatically anemia and hemoglobin of 6.3. He underwent 4 units of PRBC transfusions again and was sent out on PPI and iron supplementation with the plan to follow-up with Dr. [name].  He was also  given hematology referral. He had a repeat ultrasound of the abdomen on 07/29/2016 which showed significant increase in splenomegaly in 2 months suggesting significant portal hypertension versus some element of splenic sequestration.  He continues to note intermittent black stools.  Hemoglobin is stable and improved today and is up to 11.1. Patient notes he feels better after his transfusions. Has not noted lower GI bleeding at this time. We discuss any other workup to rule out less likely other possibilities of his anemia.  INTERVAL HISTORY  William [name] is here for a scheduled follow-up of his anemia that is primarily related to ongoing issues with GI bleeding and has not been controllable by multiple GI interventions. The patient's last visit with [place] was on 01/10/2019. The pt reports that he is doing well overall.  The pt reports he is good. Pt has been traveling to [facility] for transplant evaluation. He has been able to do more than previously. Pt is seeing very little blood in the stool and slightly darker but not often. He has been doing great with monthly Sandostatin shot and iron. Pt is also taking hydrochlorothiazide. He is no longer taking lysteda for about a month. He had a mammogram. Pt has had both doses of the COVID19 vaccine.   Lab results today (06/27/19) of CBC w/diff and CMP is as follows: all values are WNL except for RBC at 3.38, Hemoglobin at 11.5, HCT at 33.3, RDW at 17.7, Platelets at 61K, Lymphs Abs at 0.6K, Eosinophils Absolute at 0.6K 06/27/19 of Iron and TIBC is as follows: all values are WNL  06/27/19 of Ferritin at 203: WNL  On review of systems, pt reports small amounts of blood/black stool and denies abdominal pain, pedal edema and any other symptoms.  MEDICAL HISTORY:  Past Medical History:  Diagnosis Date  . AAA (abdominal aortic aneurysm) ([place]) 03/2014   repaired 2020  . Bilateral renal cysts    simple (03/2014 MRI)  . CAD (coronary artery disease)   .  Cholelithiases 2018   asymptomatic  . Chronic diastolic heart failure ([place])   . Cirrhosis ([place]) 05/2016   secondary to NASH; most recent u/s abd 07/2016 showed splenomegaly.  Hx of portal HTN changes with mild ascites and splenomegaly.  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 Diabetes mellitus with complication ([place]) 56/8127   A1c 6.8%  . History of blood transfusion 2018 X 4 dates   "low blood" (09/21/2016)  . Hyperlipemia, mixed    elevated LFTs when on statins.    . Hypertension    Cr bump 04/01/16 so I changed benicar-hct to benicar plain and added amlodipine 5 mg.  . Iron deficiency anemia 05/2016   Acute blood loss anemia: hospitalized, required transfusion x 3 U: colonoscopy and capsule study unrevealing.  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  W/u for ? hemolytic anemia to be pursued by hematologist as outpt.  Dr. [name], GI at [facility] following, too---he rec'd onc do bone marrow bx as of Jan 2019  . Iron deficiency anemia due to chronic blood loss 2018/19   GI: transfusions x >20 required; multiple endoscopies and bleeding scans unrevealing. Lysteda and octreotide + monthly iron infusions as of 04/2017. Iron infusions changed to every other month as of 09/2018 hem f/u.  [name] Liver failure ([place]) 2020   NASH cirrhosis-->pt began April 2020 undergoing long eval for placement on liver transplant list at [facility].  . Lung field abnormal finding on examination    Bibasilar L>R mild insp crackles-->x-ray showed mild interstitial changes/fibrosis/scarring.  Changes noted on all CXRs in 2018/2019.  Liver Transplant eval 03/2018->mild restriction on PFTs but no obstruction.  See further details in [place] "pulm fibrosis" section  . Microscopic hematuria    Eval unremarkable by Dr. Eulogio Ditch.  . 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 Obesity   .  Pulmonary fibrosis (HCC)    PFTs: restrictive lung dz ([facility] transplant eval 05/2018). 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 Pulm rehab helping as of 11/2018.  [name] Spleen enlarged     SURGICAL HISTORY: Past Surgical History:  Procedure Laterality Date  . ABDOMINAL AORTIC ANEURYSM REPAIR  08/23/2018   [facility]  . CARDIAC CATHETERIZATION    . CARDIOVASCULAR STRESS TEST  01/17/2012; 05/04/16   Normal stress nuclear study x 2 (2018--Normal perfusion. LVEF 66% with normal wall motion. This is a low risk study).  . CERVICAL DISCECTOMY  1992  . COLONOSCOPY N/A 05/27/2016   No site/explanation for blood loss found.  Erythematous mucosa in cecum and ascending colon--Cecal bx: normal.  Procedure: COLONOSCOPY;  Surgeon: Carol Ada, MD;  Location: [facility] ENDOSCOPY;  Service: Endoscopy;  Laterality: N/A;  . COLONOSCOPY W/ POLYPECTOMY  09/10/2014   Polypectomy x 2: recall 5 yrs (Dr. [name]).  . CORONARY ANGIOPLASTY    . CORONARY ARTERY BYPASS GRAFT  03/2006  . CT ABDOMEN WITH CONTRAST ([place] HX)  11/2018  . DEXA  03/07/2019   T score 0.2/NORMAL  . ENTEROSCOPY N/A 07/31/2016   Procedure: ENTEROSCOPY;  Surgeon: Ladene Artist, MD;  Location: [facility] ENDOSCOPY;  Service: Endoscopy;  Laterality: N/A;  . ENTEROSCOPY N/A 12/02/2016   Procedure: ENTEROSCOPY;  Surgeon: Carol Ada, MD;  Location: [facility] ENDOSCOPY;  Service: Endoscopy;  Laterality: N/A;  . ESOPHAGOGASTRODUODENOSCOPY N/A 05/25/2016   No site/explanation for blood loss found.  Procedure: ESOPHAGOGASTRODUODENOSCOPY (EGD);  Surgeon: Juanita Craver, MD;  Location: [facility] ENDOSCOPY;  Service: Endoscopy;  Laterality: N/A;  . ESOPHAGOGASTRODUODENOSCOPY N/A 09/23/2016   Procedure: ESOPHAGOGASTRODUODENOSCOPY (EGD);  Surgeon: Carol Ada, MD;  Location: [place];  Service: Endoscopy;  Laterality: N/A;  . ESOPHAGOGASTRODUODENOSCOPY (EGD) WITH PROPOFOL N/A  08/21/2018   Procedure: ESOPHAGOGASTRODUODENOSCOPY (EGD) WITH PROPOFOL;  Surgeon: Carol Ada, MD;  Location: [facility] ENDOSCOPY;  Service: Endoscopy;  Laterality: N/A;  . GIVENS CAPSULE STUDY N/A 05/27/2016   No source identified.  Repeat 10/2016--results pending.  Procedure: GIVENS CAPSULE STUDY;  Surgeon: Carol Ada, MD;  Location: [facility] ENDOSCOPY;  Service: Endoscopy;  Laterality: N/A;  . GIVENS CAPSULE STUDY N/A 10/15/2016   Procedure: GIVENS CAPSULE STUDY;  Surgeon: Carol Ada, MD;  Location: [facility] ENDOSCOPY;  Service: Endoscopy;  Laterality: N/A;  . GIVENS CAPSULE STUDY N/A 02/13/2017   Procedure: GIVENS CAPSULE STUDY;  Surgeon: Carol Ada, MD;  Location: [place];  Service: Endoscopy;  Laterality: N/A;  . NECK SURGERY  1991  . NM GI BLOOD LOSS  01/27/2017   NEGATIVE  . SMALL BOWEL ENTEROSCOPY  10/2016   ([facility], Dr. [name]: Double balloon enteroscopy--to the level of proximal ileum) jejunal polyps- which were removed--not bleeding & benign.  No source of bleeding was identified.  . TRANSTHORACIC ECHOCARDIOGRAM  05/25/2016; 10/2017   05/2016: EF 60%, normal wall motion, grd II DD, mild aortic stenosis, dilated aortic root and ascending aorta. 10/2017: Mild LVH; no mention made of LV function.  Moderate aorticstenosis with mean gradient 19 peak gradient 41. AVA 1.1 cm^2.  05/2018 EF 55%,4.3 cm ascend aneur, mild AS.  [name] VASECTOMY  1977    SOCIAL HISTORY: Social History   Socioeconomic History  . Marital status: Married    Spouse name: Not on file  . Number of children: Not on file  . Years of education: Not on file  . Highest education level: Not on file  Occupational History  . Not on file  Tobacco Use  . Smoking status: Former Smoker    Types: Cigarettes  . Smokeless tobacco: Never Used  . Tobacco comment: QUIT I 1983  Substance and Sexual Activity  . Alcohol use: No  . Drug use: No  . Sexual activity: Yes  Other Topics Concern  . Not on file  Social History Narrative   Married, 3  grown children, 3 GCs.   Educ: 10th grade.   Occupation: Retired Brewing technologist.   Tob: quit 1983, smoked about 20 pack-yr hx prior.   No alcohol.   Social Determinants of Health   Financial Resource Strain:   . Difficulty of Paying Living Expenses:   Food Insecurity:   . Worried About Charity fundraiser in the Last Year:   . Arboriculturist in the Last Year:   Transportation Needs:   . Film/video editor (Medical):   [name] Lack of Transportation (Non-Medical):   Physical Activity:   . Days of Exercise per Week:   . Minutes of Exercise per Session:   Stress:   . Feeling of Stress :   Social Connections:   . Frequency of Communication with Friends and Family:   . Frequency of Social  Gatherings with Friends and Family:   . Attends Religious Services:   . Active Member of Clubs or Organizations:   . Attends Archivist Meetings:   [name] Marital Status:   Intimate Partner Violence:   . Fear of Current or Ex-Partner:   . Emotionally Abused:   [name] Physically Abused:   . Sexually Abused:     FAMILY HISTORY: Family History  Problem Relation Age of Onset  . Hypertension Mother   . Cancer - Other Mother        liver cancer  . Heart disease Father   . Heart attack Father   . Cancer - Lung Father   . Diabetes Father   . Liver disease Sister   . Anemia Sister   . Heart disease Sister   . Heart attack Sister   . Heart disease Brother        CABG 20 YEARS AGO  . Hypertension Brother   . Mesothelioma Brother        HALF-BROTHER  . Cancer - Other Brother        CLL  . Cancer - Lung Brother        Mets to brain  . Cancer - Lung Sister        HALF-SISTER  . Liver disease Brother   . Heart disease Brother        CABG-2012  . Emphysema Maternal Grandfather   . Cancer - Other Paternal Grandmother        Stomach  . Heart attack Paternal Grandfather     ALLERGIES:  is allergic to statins.  MEDICATIONS:  Current Outpatient Medications  Medication Sig Dispense Refill   . hydrocortisone 2.5 % cream APPLY TWICE A DAY TO SCALING AREAS OF BROWS WHEN NEEDED. MAY APPLY TO SCALING INSIDE OF EARS AS WELL    . iron sucrose (VENOFER) 20 MG/ML injection Inject 750 mg into the vein. Every 60 days    . lactulose (CHRONULAC) 10 GM/15ML solution Take 13.3333 g by mouth 2 (two) times a day.    . metoprolol tartrate (LOPRESSOR) 25 MG tablet TAKE 0.5 TABLETS (12.5 MG TOTAL) BY MOUTH 2 (TWO) TIMES DAILY. 90 tablet 1  . OCTREOTIDE ACETATE IJ Inject 30 mcg into the vein every 30 (thirty) days.     . pantoprazole (PROTONIX) 40 MG tablet Take 1 tablet (40 mg total) by mouth 2 (two) times daily before a meal. TAKE 1 TABLET BY MOUTH TWICE DAILY BEFORE A MEAL 180 tablet 1  . potassium chloride SA (KLOR-CON) 20 MEQ tablet Take 1 tablet (20 mEq total) by mouth 2 (two) times daily. TAKE 1 TABLET (20 MEQ TOTAL) BY MOUTH TWICE A DAY 180 tablet 1  . spironolactone (ALDACTONE) 25 MG tablet Take by mouth.    . torsemide (DEMADEX) 20 MG tablet TAKE 2 TABLETS (40 MG TOTAL) BY MOUTH DAILY. 180 tablet 1  . spironolactone (ALDACTONE) 25 MG tablet TAKE 0.5 TABLET (12.5 MG TOTAL) BY MOUTH DAILY. 45 tablet 1  . tranexamic acid (LYSTEDA) 650 MG TABS tablet TAKE 1 TABLET BY MOUTH TWICE DAILY BEFORE A MEAL 60 tablet 2   No current facility-administered medications for this visit.   Facility-Administered Medications Ordered in Other Visits  Medication Dose Route Frequency Provider Last Rate Last Admin  . 0.9 %  sodium chloride infusion   Intravenous Continuous Brunetta Genera, MD      . ferric carboxymaltose (INJECTAFER) 750 mg in sodium chloride 0.9 % 250 mL IVPB  750  mg Intravenous Once Brunetta Genera, MD      . heparin lock flush 100 unit/mL  500 Units Intracatheter Daily PRN Truitt Merle, MD      . sodium chloride flush (NS) 0.9 % injection 10 mL  10 mL Intracatheter PRN Truitt Merle, MD        REVIEW OF SYSTEMS:   A 10+ POINT REVIEW OF SYSTEMS WAS OBTAINED including neurology, dermatology,  psychiatry, cardiac, respiratory, lymph, extremities, GI, GU, Musculoskeletal, constitutional, breasts, reproductive, HEENT.  All pertinent positives are noted in the HPI.  All others are negative.   PHYSICAL EXAMINATION:   ECOG FS:2 - Symptomatic, <50% confined to bed  Vitals:   06/27/19 0844  BP: 115/61  Pulse: 60  Resp: 18  Temp: 99.1 F (37.3 C)  SpO2: 90%   Wt Readings from Last 3 Encounters:  06/27/19 210 lb 14.4 oz (95.7 kg)  01/22/19 211 lb 6.7 oz (95.9 kg)  01/10/19 210 lb 8 oz (95.5 kg)   Body mass index is 31.14 kg/m.    GENERAL:alert, in no acute distress and comfortable SKIN: no acute rashes, no significant lesions EYES: conjunctiva are pink and non-injected, sclera anicteric OROPHARYNX: MMM, no exudates, no oropharyngeal erythema or ulceration NECK: supple, no JVD LYMPH:  no palpable lymphadenopathy in the cervical, axillary or inguinal regions LUNGS: clear to auscultation b/l with normal respiratory effort HEART: regular rate & rhythm ABDOMEN:  normoactive bowel sounds , non tender, not distended. Extremity: grade 1 pedal edema PSYCH: alert & oriented x 3 with fluent speech NEURO: no focal motor/sensory deficits  LABORATORY DATA:  I have reviewed the data as listed  . CBC Latest Ref Rng & Units 06/27/2019 05/21/2019 05/02/2019  WBC 4.0 - 10.5 K/uL 4.6 5.7 5.1  Hemoglobin 13.0 - 17.0 g/dL 11.5(L) 13.1(A) 10.6(L)  Hematocrit 39.0 - 52.0 % 33.3(L) 39(A) 31.3(L)  Platelets 150 - 400 K/uL 61(L) 73(A) 74(L)    CMP Latest Ref Rng & Units 05/21/2019 05/02/2019 04/07/2019  Glucose 70 - 99 mg/dL - 108(H) -  BUN 4 - 21 10 11 8   Creatinine 0.6 - 1.3 1.3 1.43(H) 1.2  Sodium 137 - 147 135(A) 136 135(A)  Potassium 3.4 - 5.3 3.7 4.0 3.8  Chloride 99 - 108 103 105 106  CO2 13 - 22 20 22 22   Calcium 8.7 - 10.7 8.4(A) 8.0(L) 8.2(A)  Total Protein 6.5 - 8.1 g/dL - 5.6(L) -  Total Bilirubin 0.3 - 1.2 mg/dL - 8.1(HH) -  Alkaline Phos 25 - 125 92 116 105  AST 14 - 40 29 31  34  ALT 10 - 40 19 25 26     Lab Results  Component Value Date   FERRITIN 203 06/27/2019   . RADIOGRAPHIC STUDIES: I have personally reviewed the radiological images as listed and agreed with the findings in the report. No results found.  ASSESSMENT & PLAN:   69 y.o. male with multiple medical comorbidities with Karlene Lineman with liver cirrhosis with portal hypertension, esophageal varices and portal hypertensive gastropathy with ongoing chronic GI bleeding   #1 Chronic blood loss anemia.  Patient has had recurrent GI bleeding requiring > 20 units of PRBCs. since April 2018 and continues to have ongoing GI bleeding.  Previous workup showed LDH within normal limits at 220 and suggests against overt hemolysis. Sedimentation rate within normal limits. Coombs' test is negative. Myeloma panel and serum free light chains suggest no monoclonal paraproteinemia. PNH testing negative Slightly lower haptoglobin levels can be related to decreased  hepatic production, low-level hemolysis due to transfusions or extravascular hemolysis due to splenic or hepatic sequestration.  small bowel endoscopy done on 12/02/2016: impression - The examined portion of the jejunum was normal. - Normal examined duodenum. - Portal hypertensive gastropathy. -Small (< 5 mm) esophageal varices. - No specimens collected.  Capsule Endoscopy on 02/15/17: No evidence of any bleeding during this examination. There was the possibility of an atypical AVM manifested as an erythematous patch. No evidence of any ulcerations, erosions, masses, or polyps.  08/21/2018 upper endoscopy revealed "Small (< 5 mm) esophageal varices. Portal hypertensive gastropathy. Normal examined duodenum. No specimens collected."  07/17/2018 CT A/P w/wo contrast revealed "1. There is again fusiform dilation of the infrarenal abdominal aorta measuring up to 3.2 cm. Additionally, there is an outpouching of contrast extending along the posterolateral aspect of  the aorta, beyond the intimal calcifications, measuring approximately 3.1 x 1.0 cm (image 455, series 7) with additional small outpouching located immediately superiorly. 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 2. Findings of probable pulmonary fibrosis better evaluated on recent dedicated CT of the chest May 2020. 3. Cirrhosis with portal hypertension as previously  described. Trace Ascites. 4. Three-vessel coronary artery disease."  PLAN:  -Discussed pt labwork today, 06/27/19; of CBC w/diff and CMP is as follows: all values are WNL except for RBC at 3.38, Hemoglobin at 11.5, HCT at 33.3, RDW at 17.7, Platelets at 61K, Lymphs Abs at 0.6K, Eosinophils Absolute at 0.6K -Discussed 06/27/19 of Iron and TIBC is as follows: all values are WNL  -Discussed 06/27/19 of Ferritin at 203: WNL -Advised on mammogram -gynochlomastia -Advised not needing as much iron after transplant  -Will continue to watch until after transplant   -Continue same dosage of iron until transplant- IV iron every 2 months, monthly Standostatin -Will see back in 4 months   FOLLOW UP: -Continue IV Injectafer every 8weeks x 3 doses -continue q4weekly Sandostatin- plz schedule next 6 -labs q8weeks with IV Iron -RTC with Dr Irene Limbo in 16 weeks   The total time spent in the appt was 20 minutes and more than 50% was on counseling and direct patient cares.  All of the patient's questions were answered with apparent satisfaction. The patient knows to call the clinic with any problems, questions or concerns.  Sullivan Lone MD [place] AAHIVMS [facility] Hematology/Oncology Physician [facility]  (Office):       [phone] (Work cell):  [phone] (Fax):           [phone]  I, Dawayne Cirri am acting as a scribe for Dr. Sullivan Lone.   .I have reviewed the above documentation for accuracy and completeness, and I agree with the above. Brunetta Genera MD

## 2019-06-27 ENCOUNTER — Other Ambulatory Visit: Payer: 59

## 2019-06-27 ENCOUNTER — Ambulatory Visit: Payer: 59

## 2019-06-27 ENCOUNTER — Inpatient Hospital Stay: Payer: 59

## 2019-06-27 ENCOUNTER — Other Ambulatory Visit: Payer: Self-pay

## 2019-06-27 ENCOUNTER — Inpatient Hospital Stay (HOSPITAL_BASED_OUTPATIENT_CLINIC_OR_DEPARTMENT_OTHER): Payer: 59 | Admitting: Hematology

## 2019-06-27 VITALS — BP 113/59 | HR 57 | Temp 97.8°F | Resp 17

## 2019-06-27 VITALS — BP 115/61 | HR 60 | Temp 99.1°F | Resp 18 | Ht 69.0 in | Wt 210.9 lb

## 2019-06-27 DIAGNOSIS — K922 Gastrointestinal hemorrhage, unspecified: Secondary | ICD-10-CM | POA: Diagnosis not present

## 2019-06-27 DIAGNOSIS — D696 Thrombocytopenia, unspecified: Secondary | ICD-10-CM

## 2019-06-27 DIAGNOSIS — D500 Iron deficiency anemia secondary to blood loss (chronic): Secondary | ICD-10-CM

## 2019-06-27 DIAGNOSIS — C44319 Basal cell carcinoma of skin of other parts of face: Secondary | ICD-10-CM | POA: Diagnosis not present

## 2019-06-27 LAB — CBC WITH DIFFERENTIAL/PLATELET
Abs Immature Granulocytes: 0.01 10*3/uL (ref 0.00–0.07)
Basophils Absolute: 0.1 10*3/uL (ref 0.0–0.1)
Basophils Relative: 2 %
Eosinophils Absolute: 0.6 10*3/uL — ABNORMAL HIGH (ref 0.0–0.5)
Eosinophils Relative: 14 %
HCT: 33.3 % — ABNORMAL LOW (ref 39.0–52.0)
Hemoglobin: 11.5 g/dL — ABNORMAL LOW (ref 13.0–17.0)
Immature Granulocytes: 0 %
Lymphocytes Relative: 14 %
Lymphs Abs: 0.6 10*3/uL — ABNORMAL LOW (ref 0.7–4.0)
MCH: 34 pg (ref 26.0–34.0)
MCHC: 34.5 g/dL (ref 30.0–36.0)
MCV: 98.5 fL (ref 80.0–100.0)
Monocytes Absolute: 0.8 10*3/uL (ref 0.1–1.0)
Monocytes Relative: 17 %
Neutro Abs: 2.5 10*3/uL (ref 1.7–7.7)
Neutrophils Relative %: 53 %
Platelets: 61 10*3/uL — ABNORMAL LOW (ref 150–400)
RBC: 3.38 MIL/uL — ABNORMAL LOW (ref 4.22–5.81)
RDW: 17.7 % — ABNORMAL HIGH (ref 11.5–15.5)
WBC: 4.6 10*3/uL (ref 4.0–10.5)
nRBC: 0 % (ref 0.0–0.2)

## 2019-06-27 LAB — FERRITIN: Ferritin: 203 ng/mL (ref 24–336)

## 2019-06-27 LAB — IRON AND TIBC
Iron: 57 ug/dL (ref 42–163)
Saturation Ratios: 24 % (ref 20–55)
TIBC: 244 ug/dL (ref 202–409)
UIBC: 186 ug/dL (ref 117–376)

## 2019-06-27 MED ORDER — SODIUM CHLORIDE 0.9 % IV SOLN
750.0000 mg | Freq: Once | INTRAVENOUS | Status: AC
  Administered 2019-06-27: 750 mg via INTRAVENOUS
  Filled 2019-06-27: qty 15

## 2019-06-27 MED ORDER — SODIUM CHLORIDE 0.9 % IV SOLN
INTRAVENOUS | Status: DC
  Filled 2019-06-27: qty 250

## 2019-06-27 NOTE — Patient Instructions (Signed)

## 2019-06-29 DIAGNOSIS — J181 Lobar pneumonia, unspecified organism: Secondary | ICD-10-CM | POA: Diagnosis not present

## 2019-07-02 DIAGNOSIS — Z01818 Encounter for other preprocedural examination: Secondary | ICD-10-CM | POA: Diagnosis not present

## 2019-07-03 ENCOUNTER — Other Ambulatory Visit: Payer: Self-pay | Admitting: Gastroenterology

## 2019-07-03 DIAGNOSIS — K7460 Unspecified cirrhosis of liver: Secondary | ICD-10-CM | POA: Diagnosis not present

## 2019-07-03 DIAGNOSIS — I8500 Esophageal varices without bleeding: Secondary | ICD-10-CM | POA: Diagnosis not present

## 2019-07-09 ENCOUNTER — Other Ambulatory Visit: Payer: Self-pay

## 2019-07-09 ENCOUNTER — Inpatient Hospital Stay: Payer: 59 | Attending: Hematology

## 2019-07-09 VITALS — BP 109/70 | HR 65 | Resp 18

## 2019-07-09 DIAGNOSIS — D500 Iron deficiency anemia secondary to blood loss (chronic): Secondary | ICD-10-CM | POA: Diagnosis not present

## 2019-07-09 DIAGNOSIS — K922 Gastrointestinal hemorrhage, unspecified: Secondary | ICD-10-CM | POA: Insufficient documentation

## 2019-07-09 DIAGNOSIS — Z01818 Encounter for other preprocedural examination: Secondary | ICD-10-CM | POA: Diagnosis not present

## 2019-07-09 DIAGNOSIS — Z944 Liver transplant status: Secondary | ICD-10-CM

## 2019-07-09 HISTORY — DX: Liver transplant status: Z94.4

## 2019-07-09 MED ORDER — OCTREOTIDE ACETATE 30 MG IM KIT
30.0000 mg | PACK | Freq: Once | INTRAMUSCULAR | Status: AC
  Administered 2019-07-09: 30 mg via INTRAMUSCULAR

## 2019-07-09 MED ORDER — OCTREOTIDE ACETATE 30 MG IM KIT
PACK | INTRAMUSCULAR | Status: AC
  Filled 2019-07-09: qty 1

## 2019-07-09 NOTE — Patient Instructions (Signed)
Octreotide injection solution What is this medicine? OCTREOTIDE (ok TREE oh tide) is used to reduce blood levels of growth hormone in patients with a condition called acromegaly. This medicine also reduces flushing and watery diarrhea caused by certain types of cancer. This medicine may be used for other purposes; ask your health care provider or pharmacist if you have questions. COMMON BRAND NAME(S): Leatha Gilding, Sandostatin What should I tell my health care provider before I take this medicine? They need to know if you have any of these conditions:  diabetes  gallbladder disease  kidney disease  liver disease  thyroid disease  an unusual or allergic reaction to octreotide, other medicines, foods, dyes, or preservatives  pregnant or trying to get pregnant  breast-feeding How should I use this medicine? This medicine is for injection under the skin or into a vein (only in emergency situations). It is usually given by a health care professional in a hospital or clinic setting. If you get this medicine at home, you will be taught how to prepare and give this medicine. Allow the injection solution to come to room temperature before use. Do not warm it artificially. Use exactly as directed. Take your medicine at regular intervals. Do not take your medicine more often than directed. It is important that you put your used needles and syringes in a special sharps container. Do not put them in a trash can. If you do not have a sharps container, call your pharmacist or healthcare provider to get one. Talk to your pediatrician regarding the use of this medicine in children. Special care may be needed. Overdosage: If you think you have taken too much of this medicine contact a poison control center or emergency room at once. NOTE: This medicine is only for you. Do not share this medicine with others. What if I miss a dose? If you miss a dose, take it as soon as you can. If it is almost time for your  next dose, take only that dose. Do not take double or extra doses. What may interact with this medicine?  bromocriptine  certain medicines for blood pressure, heart disease, irregular heartbeat  cyclosporine  diuretics  medicines for diabetes, including insulin  quinidine This list may not describe all possible interactions. Give your health care provider a list of all the medicines, herbs, non-prescription drugs, or dietary supplements you use. Also tell them if you smoke, drink alcohol, or use illegal drugs. Some items may interact with your medicine. What should I watch for while using this medicine? Visit your doctor or health care professional for regular checks on your progress. To help reduce irritation at the injection site, use a different site for each injection and make sure the solution is at room temperature before use. This medicine may cause decreases in blood sugar. Signs of low blood sugar include chills, cool, pale skin or cold sweats, drowsiness, extreme hunger, fast heartbeat, headache, nausea, nervousness or anxiety, shakiness, trembling, unsteadiness, tiredness, or weakness. Contact your doctor or health care professional right away if you experience any of these symptoms. This medicine may increase blood sugar. Ask your healthcare provider if changes in diet or medicines are needed if you have diabetes. This medicine may cause a decrease in vitamin B12. You should make sure that you get enough vitamin B12 while you are taking this medicine. Discuss the foods you eat and the vitamins you take with your health care professional. What side effects may I notice from receiving this medicine? Side  effects that you should report to your doctor or health care professional as soon as possible:  allergic reactions like skin rash, itching or hives, swelling of the face, lips, or tongue  fast, slow, or irregular heartbeat  right upper belly pain  severe stomach pain  signs  and symptoms of high blood sugar such as being more thirsty or hungry or having to urinate more than normal. You may also feel very tired or have blurry vision.  signs and symptoms of low blood sugar such as feeling anxious; confusion; dizziness; increased hunger; unusually weak or tired; increased sweating; shakiness; cold, clammy skin; irritable; headache; blurred vision; fast heartbeat; loss of consciousness  unusually weak or tired Side effects that usually do not require medical attention (report to your doctor or health care professional if they continue or are bothersome):  diarrhea  dizziness  gas  headache  nausea, vomiting  pain, redness, or irritation at site where injected  upset stomach This list may not describe all possible side effects. Call your doctor for medical advice about side effects. You may report side effects to FDA at 1-800-FDA-1088. Where should I keep my medicine? Keep out of the reach of children. Store in a refrigerator between 2 and 8 degrees C (36 and 46 degrees F). Protect from light. Allow to come to room temperature naturally. Do not use artificial heat. If protected from light, the injection may be stored at room temperature between 20 and 30 degrees C (70 and 86 degrees F) for 14 days. After the initial use, throw away any unused portion of a multiple dose vial after 14 days. Throw away unused portions of the ampules after use. NOTE: This sheet is a summary. It may not cover all possible information. If you have questions about this medicine, talk to your doctor, pharmacist, or health care provider.  2020 Elsevier/Gold Standard (2018-08-23 13:33:09)

## 2019-07-11 DIAGNOSIS — K7681 Hepatopulmonary syndrome: Secondary | ICD-10-CM | POA: Diagnosis not present

## 2019-07-11 DIAGNOSIS — Z01818 Encounter for other preprocedural examination: Secondary | ICD-10-CM | POA: Diagnosis not present

## 2019-07-11 DIAGNOSIS — R911 Solitary pulmonary nodule: Secondary | ICD-10-CM | POA: Diagnosis not present

## 2019-07-11 DIAGNOSIS — Z7682 Awaiting organ transplant status: Secondary | ICD-10-CM | POA: Diagnosis not present

## 2019-07-11 DIAGNOSIS — J849 Interstitial pulmonary disease, unspecified: Secondary | ICD-10-CM | POA: Diagnosis not present

## 2019-07-11 DIAGNOSIS — K7581 Nonalcoholic steatohepatitis (NASH): Secondary | ICD-10-CM | POA: Diagnosis not present

## 2019-07-11 DIAGNOSIS — J984 Other disorders of lung: Secondary | ICD-10-CM | POA: Diagnosis not present

## 2019-07-11 DIAGNOSIS — R0902 Hypoxemia: Secondary | ICD-10-CM | POA: Diagnosis not present

## 2019-07-11 DIAGNOSIS — J84111 Idiopathic interstitial pneumonia, not otherwise specified: Secondary | ICD-10-CM | POA: Diagnosis not present

## 2019-07-11 DIAGNOSIS — Z8701 Personal history of pneumonia (recurrent): Secondary | ICD-10-CM | POA: Diagnosis not present

## 2019-07-11 DIAGNOSIS — K7460 Unspecified cirrhosis of liver: Secondary | ICD-10-CM | POA: Diagnosis not present

## 2019-07-11 DIAGNOSIS — Z1159 Encounter for screening for other viral diseases: Secondary | ICD-10-CM | POA: Diagnosis not present

## 2019-07-11 DIAGNOSIS — R188 Other ascites: Secondary | ICD-10-CM | POA: Diagnosis not present

## 2019-07-11 HISTORY — PX: CT CHEST WWO CONTAST (ARMC HX): HXRAD1348

## 2019-07-16 DIAGNOSIS — Z01818 Encounter for other preprocedural examination: Secondary | ICD-10-CM | POA: Diagnosis not present

## 2019-07-23 DIAGNOSIS — Z01818 Encounter for other preprocedural examination: Secondary | ICD-10-CM | POA: Diagnosis not present

## 2019-07-30 DIAGNOSIS — J181 Lobar pneumonia, unspecified organism: Secondary | ICD-10-CM | POA: Diagnosis not present

## 2019-07-30 DIAGNOSIS — Z7682 Awaiting organ transplant status: Secondary | ICD-10-CM | POA: Diagnosis not present

## 2019-07-30 DIAGNOSIS — J849 Interstitial pulmonary disease, unspecified: Secondary | ICD-10-CM | POA: Diagnosis not present

## 2019-07-30 DIAGNOSIS — Z01818 Encounter for other preprocedural examination: Secondary | ICD-10-CM | POA: Diagnosis not present

## 2019-07-31 DIAGNOSIS — K7460 Unspecified cirrhosis of liver: Secondary | ICD-10-CM | POA: Diagnosis not present

## 2019-07-31 DIAGNOSIS — J849 Interstitial pulmonary disease, unspecified: Secondary | ICD-10-CM | POA: Diagnosis not present

## 2019-07-31 DIAGNOSIS — I700 Atherosclerosis of aorta: Secondary | ICD-10-CM | POA: Diagnosis not present

## 2019-07-31 DIAGNOSIS — I10 Essential (primary) hypertension: Secondary | ICD-10-CM | POA: Diagnosis not present

## 2019-07-31 DIAGNOSIS — Z01818 Encounter for other preprocedural examination: Secondary | ICD-10-CM | POA: Diagnosis not present

## 2019-07-31 DIAGNOSIS — Z7682 Awaiting organ transplant status: Secondary | ICD-10-CM | POA: Diagnosis not present

## 2019-07-31 DIAGNOSIS — I440 Atrioventricular block, first degree: Secondary | ICD-10-CM | POA: Diagnosis not present

## 2019-07-31 DIAGNOSIS — R0902 Hypoxemia: Secondary | ICD-10-CM | POA: Diagnosis not present

## 2019-07-31 DIAGNOSIS — I2510 Atherosclerotic heart disease of native coronary artery without angina pectoris: Secondary | ICD-10-CM | POA: Diagnosis not present

## 2019-07-31 DIAGNOSIS — I25810 Atherosclerosis of coronary artery bypass graft(s) without angina pectoris: Secondary | ICD-10-CM | POA: Diagnosis not present

## 2019-07-31 DIAGNOSIS — R188 Other ascites: Secondary | ICD-10-CM | POA: Diagnosis not present

## 2019-07-31 DIAGNOSIS — K7581 Nonalcoholic steatohepatitis (NASH): Secondary | ICD-10-CM | POA: Diagnosis not present

## 2019-08-01 MED FILL — GENERLAC 10 GM/15 ML SOLN: 10 | 31 days supply | Qty: 2838 | Fill #5

## 2019-08-02 DIAGNOSIS — I2510 Atherosclerotic heart disease of native coronary artery without angina pectoris: Secondary | ICD-10-CM | POA: Diagnosis not present

## 2019-08-02 DIAGNOSIS — E871 Hypo-osmolality and hyponatremia: Secondary | ICD-10-CM | POA: Diagnosis not present

## 2019-08-02 DIAGNOSIS — Z944 Liver transplant status: Secondary | ICD-10-CM | POA: Insufficient documentation

## 2019-08-02 DIAGNOSIS — Z4659 Encounter for fitting and adjustment of other gastrointestinal appliance and device: Secondary | ICD-10-CM | POA: Diagnosis not present

## 2019-08-02 DIAGNOSIS — K7460 Unspecified cirrhosis of liver: Secondary | ICD-10-CM | POA: Diagnosis not present

## 2019-08-02 DIAGNOSIS — E785 Hyperlipidemia, unspecified: Secondary | ICD-10-CM | POA: Diagnosis not present

## 2019-08-02 DIAGNOSIS — K7581 Nonalcoholic steatohepatitis (NASH): Secondary | ICD-10-CM | POA: Diagnosis not present

## 2019-08-02 DIAGNOSIS — N390 Urinary tract infection, site not specified: Secondary | ICD-10-CM | POA: Diagnosis not present

## 2019-08-02 DIAGNOSIS — Z452 Encounter for adjustment and management of vascular access device: Secondary | ICD-10-CM | POA: Diagnosis not present

## 2019-08-02 DIAGNOSIS — I130 Hypertensive heart and chronic kidney disease with heart failure and stage 1 through stage 4 chronic kidney disease, or unspecified chronic kidney disease: Secondary | ICD-10-CM | POA: Diagnosis not present

## 2019-08-02 DIAGNOSIS — D62 Acute posthemorrhagic anemia: Secondary | ICD-10-CM | POA: Diagnosis not present

## 2019-08-02 DIAGNOSIS — I350 Nonrheumatic aortic (valve) stenosis: Secondary | ICD-10-CM | POA: Diagnosis not present

## 2019-08-02 DIAGNOSIS — N179 Acute kidney failure, unspecified: Secondary | ICD-10-CM | POA: Diagnosis not present

## 2019-08-02 DIAGNOSIS — K8020 Calculus of gallbladder without cholecystitis without obstruction: Secondary | ICD-10-CM | POA: Diagnosis not present

## 2019-08-02 DIAGNOSIS — J90 Pleural effusion, not elsewhere classified: Secondary | ICD-10-CM | POA: Diagnosis not present

## 2019-08-02 DIAGNOSIS — J9589 Other postprocedural complications and disorders of respiratory system, not elsewhere classified: Secondary | ICD-10-CM | POA: Diagnosis not present

## 2019-08-02 DIAGNOSIS — R739 Hyperglycemia, unspecified: Secondary | ICD-10-CM | POA: Diagnosis not present

## 2019-08-02 DIAGNOSIS — Z951 Presence of aortocoronary bypass graft: Secondary | ICD-10-CM | POA: Diagnosis not present

## 2019-08-02 DIAGNOSIS — K219 Gastro-esophageal reflux disease without esophagitis: Secondary | ICD-10-CM | POA: Diagnosis not present

## 2019-08-02 DIAGNOSIS — Z9889 Other specified postprocedural states: Secondary | ICD-10-CM | POA: Diagnosis not present

## 2019-08-02 DIAGNOSIS — D849 Immunodeficiency, unspecified: Secondary | ICD-10-CM | POA: Diagnosis not present

## 2019-08-02 DIAGNOSIS — K7469 Other cirrhosis of liver: Secondary | ICD-10-CM | POA: Diagnosis not present

## 2019-08-02 DIAGNOSIS — Z95828 Presence of other vascular implants and grafts: Secondary | ICD-10-CM | POA: Diagnosis not present

## 2019-08-02 DIAGNOSIS — N170 Acute kidney failure with tubular necrosis: Secondary | ICD-10-CM | POA: Diagnosis not present

## 2019-08-02 DIAGNOSIS — Z7682 Awaiting organ transplant status: Secondary | ICD-10-CM | POA: Diagnosis not present

## 2019-08-02 DIAGNOSIS — I8510 Secondary esophageal varices without bleeding: Secondary | ICD-10-CM | POA: Diagnosis not present

## 2019-08-02 DIAGNOSIS — D899 Disorder involving the immune mechanism, unspecified: Secondary | ICD-10-CM | POA: Diagnosis not present

## 2019-08-02 DIAGNOSIS — J849 Interstitial pulmonary disease, unspecified: Secondary | ICD-10-CM | POA: Diagnosis not present

## 2019-08-02 DIAGNOSIS — D509 Iron deficiency anemia, unspecified: Secondary | ICD-10-CM | POA: Diagnosis not present

## 2019-08-02 DIAGNOSIS — T380X5A Adverse effect of glucocorticoids and synthetic analogues, initial encounter: Secondary | ICD-10-CM | POA: Diagnosis not present

## 2019-08-02 DIAGNOSIS — B965 Pseudomonas (aeruginosa) (mallei) (pseudomallei) as the cause of diseases classified elsewhere: Secondary | ICD-10-CM | POA: Diagnosis not present

## 2019-08-02 DIAGNOSIS — R918 Other nonspecific abnormal finding of lung field: Secondary | ICD-10-CM | POA: Diagnosis not present

## 2019-08-02 DIAGNOSIS — E872 Acidosis: Secondary | ICD-10-CM | POA: Diagnosis not present

## 2019-08-02 DIAGNOSIS — R0902 Hypoxemia: Secondary | ICD-10-CM | POA: Diagnosis not present

## 2019-08-02 DIAGNOSIS — I25810 Atherosclerosis of coronary artery bypass graft(s) without angina pectoris: Secondary | ICD-10-CM | POA: Diagnosis not present

## 2019-08-02 DIAGNOSIS — K835 Biliary cyst: Secondary | ICD-10-CM | POA: Diagnosis not present

## 2019-08-02 DIAGNOSIS — Z1629 Resistance to other single specified antibiotic: Secondary | ICD-10-CM | POA: Diagnosis not present

## 2019-08-02 HISTORY — PX: LIVER TRANSPLANT: SHX410

## 2019-08-03 DIAGNOSIS — R918 Other nonspecific abnormal finding of lung field: Secondary | ICD-10-CM | POA: Diagnosis not present

## 2019-08-03 DIAGNOSIS — Z944 Liver transplant status: Secondary | ICD-10-CM | POA: Diagnosis not present

## 2019-08-03 DIAGNOSIS — Z7682 Awaiting organ transplant status: Secondary | ICD-10-CM | POA: Diagnosis not present

## 2019-08-03 DIAGNOSIS — R0902 Hypoxemia: Secondary | ICD-10-CM | POA: Diagnosis not present

## 2019-08-03 DIAGNOSIS — Z4659 Encounter for fitting and adjustment of other gastrointestinal appliance and device: Secondary | ICD-10-CM | POA: Diagnosis not present

## 2019-08-03 DIAGNOSIS — Z452 Encounter for adjustment and management of vascular access device: Secondary | ICD-10-CM | POA: Diagnosis not present

## 2019-08-03 DIAGNOSIS — J90 Pleural effusion, not elsewhere classified: Secondary | ICD-10-CM | POA: Diagnosis not present

## 2019-08-03 DIAGNOSIS — J9589 Other postprocedural complications and disorders of respiratory system, not elsewhere classified: Secondary | ICD-10-CM | POA: Insufficient documentation

## 2019-08-04 DIAGNOSIS — R739 Hyperglycemia, unspecified: Secondary | ICD-10-CM | POA: Insufficient documentation

## 2019-08-04 DIAGNOSIS — T380X5A Adverse effect of glucocorticoids and synthetic analogues, initial encounter: Secondary | ICD-10-CM | POA: Insufficient documentation

## 2019-08-04 DIAGNOSIS — Z792 Long term (current) use of antibiotics: Secondary | ICD-10-CM | POA: Insufficient documentation

## 2019-08-05 DIAGNOSIS — N390 Urinary tract infection, site not specified: Secondary | ICD-10-CM | POA: Diagnosis not present

## 2019-08-05 DIAGNOSIS — B965 Pseudomonas (aeruginosa) (mallei) (pseudomallei) as the cause of diseases classified elsewhere: Secondary | ICD-10-CM | POA: Diagnosis not present

## 2019-08-05 DIAGNOSIS — I2510 Atherosclerotic heart disease of native coronary artery without angina pectoris: Secondary | ICD-10-CM | POA: Diagnosis not present

## 2019-08-05 DIAGNOSIS — K219 Gastro-esophageal reflux disease without esophagitis: Secondary | ICD-10-CM | POA: Diagnosis not present

## 2019-08-05 DIAGNOSIS — R918 Other nonspecific abnormal finding of lung field: Secondary | ICD-10-CM | POA: Diagnosis not present

## 2019-08-05 DIAGNOSIS — K7460 Unspecified cirrhosis of liver: Secondary | ICD-10-CM | POA: Diagnosis not present

## 2019-08-05 DIAGNOSIS — D509 Iron deficiency anemia, unspecified: Secondary | ICD-10-CM | POA: Diagnosis not present

## 2019-08-05 DIAGNOSIS — Z944 Liver transplant status: Secondary | ICD-10-CM | POA: Diagnosis not present

## 2019-08-05 DIAGNOSIS — K7581 Nonalcoholic steatohepatitis (NASH): Secondary | ICD-10-CM | POA: Diagnosis not present

## 2019-08-05 DIAGNOSIS — I350 Nonrheumatic aortic (valve) stenosis: Secondary | ICD-10-CM | POA: Diagnosis not present

## 2019-08-05 DIAGNOSIS — Z9889 Other specified postprocedural states: Secondary | ICD-10-CM | POA: Diagnosis not present

## 2019-08-05 DIAGNOSIS — E871 Hypo-osmolality and hyponatremia: Secondary | ICD-10-CM | POA: Diagnosis not present

## 2019-08-05 DIAGNOSIS — N179 Acute kidney failure, unspecified: Secondary | ICD-10-CM | POA: Diagnosis not present

## 2019-08-05 DIAGNOSIS — Z951 Presence of aortocoronary bypass graft: Secondary | ICD-10-CM | POA: Diagnosis not present

## 2019-08-05 DIAGNOSIS — Z7682 Awaiting organ transplant status: Secondary | ICD-10-CM | POA: Diagnosis not present

## 2019-08-05 DIAGNOSIS — E785 Hyperlipidemia, unspecified: Secondary | ICD-10-CM | POA: Diagnosis not present

## 2019-08-05 DIAGNOSIS — D849 Immunodeficiency, unspecified: Secondary | ICD-10-CM | POA: Diagnosis not present

## 2019-08-05 DIAGNOSIS — D899 Disorder involving the immune mechanism, unspecified: Secondary | ICD-10-CM | POA: Diagnosis not present

## 2019-08-06 ENCOUNTER — Ambulatory Visit: Payer: 59

## 2019-08-06 DIAGNOSIS — Z944 Liver transplant status: Secondary | ICD-10-CM | POA: Diagnosis not present

## 2019-08-06 DIAGNOSIS — K7581 Nonalcoholic steatohepatitis (NASH): Secondary | ICD-10-CM | POA: Diagnosis not present

## 2019-08-06 DIAGNOSIS — D849 Immunodeficiency, unspecified: Secondary | ICD-10-CM | POA: Diagnosis not present

## 2019-08-06 DIAGNOSIS — Z7682 Awaiting organ transplant status: Secondary | ICD-10-CM | POA: Diagnosis not present

## 2019-08-07 DIAGNOSIS — D849 Immunodeficiency, unspecified: Secondary | ICD-10-CM | POA: Diagnosis not present

## 2019-08-07 DIAGNOSIS — Z944 Liver transplant status: Secondary | ICD-10-CM | POA: Diagnosis not present

## 2019-08-07 DIAGNOSIS — K7581 Nonalcoholic steatohepatitis (NASH): Secondary | ICD-10-CM | POA: Diagnosis not present

## 2019-08-07 DIAGNOSIS — Z7682 Awaiting organ transplant status: Secondary | ICD-10-CM | POA: Diagnosis not present

## 2019-08-08 ENCOUNTER — Ambulatory Visit: Payer: 59

## 2019-08-08 DIAGNOSIS — K7581 Nonalcoholic steatohepatitis (NASH): Secondary | ICD-10-CM | POA: Diagnosis not present

## 2019-08-08 DIAGNOSIS — K7460 Unspecified cirrhosis of liver: Secondary | ICD-10-CM | POA: Diagnosis not present

## 2019-08-08 DIAGNOSIS — B965 Pseudomonas (aeruginosa) (mallei) (pseudomallei) as the cause of diseases classified elsewhere: Secondary | ICD-10-CM | POA: Diagnosis not present

## 2019-08-08 DIAGNOSIS — E871 Hypo-osmolality and hyponatremia: Secondary | ICD-10-CM | POA: Diagnosis not present

## 2019-08-08 DIAGNOSIS — K219 Gastro-esophageal reflux disease without esophagitis: Secondary | ICD-10-CM | POA: Diagnosis not present

## 2019-08-08 DIAGNOSIS — I2510 Atherosclerotic heart disease of native coronary artery without angina pectoris: Secondary | ICD-10-CM | POA: Diagnosis not present

## 2019-08-08 DIAGNOSIS — Z944 Liver transplant status: Secondary | ICD-10-CM | POA: Diagnosis not present

## 2019-08-08 DIAGNOSIS — D509 Iron deficiency anemia, unspecified: Secondary | ICD-10-CM | POA: Diagnosis not present

## 2019-08-08 DIAGNOSIS — N179 Acute kidney failure, unspecified: Secondary | ICD-10-CM | POA: Diagnosis not present

## 2019-08-08 DIAGNOSIS — Z7682 Awaiting organ transplant status: Secondary | ICD-10-CM | POA: Diagnosis not present

## 2019-08-08 DIAGNOSIS — I350 Nonrheumatic aortic (valve) stenosis: Secondary | ICD-10-CM | POA: Diagnosis not present

## 2019-08-08 DIAGNOSIS — E785 Hyperlipidemia, unspecified: Secondary | ICD-10-CM | POA: Diagnosis not present

## 2019-08-08 DIAGNOSIS — D849 Immunodeficiency, unspecified: Secondary | ICD-10-CM | POA: Diagnosis not present

## 2019-08-09 DIAGNOSIS — Z944 Liver transplant status: Secondary | ICD-10-CM | POA: Diagnosis not present

## 2019-08-09 DIAGNOSIS — K7581 Nonalcoholic steatohepatitis (NASH): Secondary | ICD-10-CM | POA: Diagnosis not present

## 2019-08-09 DIAGNOSIS — D849 Immunodeficiency, unspecified: Secondary | ICD-10-CM | POA: Diagnosis not present

## 2019-08-09 DIAGNOSIS — Z7682 Awaiting organ transplant status: Secondary | ICD-10-CM | POA: Diagnosis not present

## 2019-08-11 DIAGNOSIS — D849 Immunodeficiency, unspecified: Secondary | ICD-10-CM | POA: Diagnosis not present

## 2019-08-11 DIAGNOSIS — Z944 Liver transplant status: Secondary | ICD-10-CM | POA: Diagnosis not present

## 2019-08-13 ENCOUNTER — Other Ambulatory Visit (HOSPITAL_COMMUNITY): Payer: Self-pay | Admitting: General Surgery

## 2019-08-13 DIAGNOSIS — Z944 Liver transplant status: Secondary | ICD-10-CM | POA: Diagnosis not present

## 2019-08-13 DIAGNOSIS — N179 Acute kidney failure, unspecified: Secondary | ICD-10-CM | POA: Diagnosis not present

## 2019-08-13 DIAGNOSIS — D849 Immunodeficiency, unspecified: Secondary | ICD-10-CM | POA: Diagnosis not present

## 2019-08-13 DIAGNOSIS — Z792 Long term (current) use of antibiotics: Secondary | ICD-10-CM | POA: Diagnosis not present

## 2019-08-13 MED FILL — TACROLIMUS 1 MG CAPSULE: 1 | 30 days supply | Qty: 270 | Fill #0

## 2019-08-14 ENCOUNTER — Other Ambulatory Visit: Payer: Self-pay | Admitting: *Deleted

## 2019-08-14 ENCOUNTER — Encounter: Payer: Self-pay | Admitting: *Deleted

## 2019-08-14 NOTE — Patient Outreach (Signed)
[facility]) Care Management  08/14/2019  HOMMER CUNLIFFE 1950/10/08 [id number]   Transition of care call/case closure   Referral received:08/14/19 Initial outreach:08/14/19 Insurance: [place] [employer]    Subjective: Initial successful telephone call to patient's preferred number in order to complete transition of care assessment; 2 HIPAA identifiers verified. Explained purpose of call and completed transition of care assessment.  Spoke with patient wife Johaan Ryser, [place], she states that patient is doing good. Grateful for transplant , She reports patient pain managed with prn tylenol . She denies post-operative problems, says surgical incisions are unremarkable. He is  tolerating diet, denies bowel or bladder problems.  Spouse/son are assisting in patient recovery, they are staying locally in [place] at [place] for the next 2 weeks to be closer to medical center as patient has twice a weekly visit for now. She discussed surgeon/transplant coordinator placing referral for Pulmonary rehab when patient returns to [place] in a few weeks, patient continues with oxygen at 2 liters only when walking . She states patient is off diabetes medication and CBG monitoring  Patient wife [name] states that she has filed Event organiser , she is a Marine scientist in Careers information officer .   He uses a  Company secretary outpatient pharmacy at [facility].     Objective:  Mr. Wedemeyer  was hospitalized at [facility] from 6/25-08/09/19 for Liver Transplant Comorbidities include: Karlene Lineman cirrhosis, recurrent GI bleed, Iron deficiency anemia , heart failure, endovascular stent for abdominal aortic aneurysm  He  was discharged to home on 08/09/19 without the need for home health services, he continues with his home oxygen, rolling walker    Assessment:  Patient voices good understanding of all discharge instructions.  See transition of care flowsheet for assessment details.   Plan:  Reviewed hospital discharge diagnosis of  Liver   and discharge treatment plan using hospital discharge instructions, assessing medication adherence, reviewing problems requiring provider notification, and discussing the importance of follow up with surgeon, primary care provider and/or specialists as directed.   No ongoing care management needs identified so will close case to [place] Management services and route successful outreach letter with [place] Management pamphlet and 24 Hour Nurse Line Magnet to [place] Management clinical pool to be mailed to patient's home address.    Joylene Draft, RN, BSN  [place] Management Coordinator  [phone]- Mobile [phone]- Toll Free Main Office

## 2019-08-15 DIAGNOSIS — Z944 Liver transplant status: Secondary | ICD-10-CM | POA: Diagnosis not present

## 2019-08-15 DIAGNOSIS — D849 Immunodeficiency, unspecified: Secondary | ICD-10-CM | POA: Diagnosis not present

## 2019-08-16 ENCOUNTER — Encounter (HOSPITAL_COMMUNITY): Payer: Self-pay | Admitting: *Deleted

## 2019-08-16 NOTE — Progress Notes (Signed)
Received signed MD referral from Dr. Debra [name] Liver Transplant surgeon at [facility]  for this pt to participate in pulmonary rehab with the the diagnosis of Hepatopulmonary Syndrome and ILD. Pt is well known to pulmonary rehab staff from previous participation in 202.   Pt is s/p . Clinical review of pt follow  up appt on 08/13/19 liver transplant clinic office note and 08/07/19 Pulmonary office note  Pt with Covid Risk Score - 8 Pt appropriate for scheduling for Pulmonary rehab.  Will forward to support staff for verification of insurance eligibility/benefits with pt consent. Pt is currently staying in [place] for close proximity post transplant.  Unsure when pt plans to return to [place].  Will have pulmonary rehab staff follow up with pt and schedule accordingly. Cherre Huger, BSN Cardiac and Training and development officer

## 2019-08-19 DIAGNOSIS — D849 Immunodeficiency, unspecified: Secondary | ICD-10-CM | POA: Diagnosis not present

## 2019-08-19 DIAGNOSIS — Z944 Liver transplant status: Secondary | ICD-10-CM | POA: Diagnosis not present

## 2019-08-19 DIAGNOSIS — R609 Edema, unspecified: Secondary | ICD-10-CM | POA: Insufficient documentation

## 2019-08-20 ENCOUNTER — Telehealth: Payer: Self-pay | Admitting: *Deleted

## 2019-08-20 ENCOUNTER — Telehealth (HOSPITAL_COMMUNITY): Payer: Self-pay

## 2019-08-20 NOTE — Telephone Encounter (Signed)
Pt insurance is active and benefits verified through [place] 0, DED $3,000/$3,000 met, out of pocket $4,000/$4,000 met, co-insurance 20%. no pre-authorization required. Passport, [place]/[employer] 08/20/2019@8 :30am, REF# [id number]

## 2019-08-20 NOTE — Telephone Encounter (Signed)
Called patient to see if he is interested in the Pulmonary Rehab Program. Patient expressed interest. Explained scheduling process and went over insurance, patient verbalized understanding. Someone from the pulmonary rehab staff will contact patient for scheduling once f/u has been completed.

## 2019-08-21 ENCOUNTER — Other Ambulatory Visit: Payer: 59

## 2019-08-21 ENCOUNTER — Ambulatory Visit: Payer: 59

## 2019-08-21 NOTE — Telephone Encounter (Signed)
Opened in error

## 2019-08-22 ENCOUNTER — Telehealth (HOSPITAL_COMMUNITY): Payer: Self-pay

## 2019-08-22 ENCOUNTER — Ambulatory Visit: Payer: 59

## 2019-08-22 ENCOUNTER — Other Ambulatory Visit: Payer: 59

## 2019-08-22 DIAGNOSIS — Z944 Liver transplant status: Secondary | ICD-10-CM | POA: Diagnosis not present

## 2019-08-22 DIAGNOSIS — Z9229 Personal history of other drug therapy: Secondary | ICD-10-CM | POA: Diagnosis not present

## 2019-08-22 DIAGNOSIS — D849 Immunodeficiency, unspecified: Secondary | ICD-10-CM | POA: Diagnosis not present

## 2019-08-22 DIAGNOSIS — Z4889 Encounter for other specified surgical aftercare: Secondary | ICD-10-CM | POA: Diagnosis not present

## 2019-08-23 ENCOUNTER — Encounter (HOSPITAL_COMMUNITY): Payer: Self-pay

## 2019-08-23 ENCOUNTER — Ambulatory Visit (HOSPITAL_COMMUNITY): Admit: 2019-08-23 | Payer: 59 | Admitting: Gastroenterology

## 2019-08-23 SURGERY — ESOPHAGOGASTRODUODENOSCOPY (EGD) WITH PROPOFOL
Anesthesia: Monitor Anesthesia Care

## 2019-08-26 ENCOUNTER — Encounter: Payer: Self-pay | Admitting: Hematology

## 2019-08-27 ENCOUNTER — Telehealth (HOSPITAL_COMMUNITY): Payer: Self-pay | Admitting: *Deleted

## 2019-08-28 ENCOUNTER — Encounter (HOSPITAL_COMMUNITY)
Admission: RE | Admit: 2019-08-28 | Discharge: 2019-08-28 | Disposition: A | Discharge status: Home / Self Care | Payer: 59 | Source: Ambulatory Visit | Attending: Cardiology | Admitting: Cardiology

## 2019-08-28 ENCOUNTER — Other Ambulatory Visit: Payer: Self-pay

## 2019-08-28 ENCOUNTER — Telehealth (HOSPITAL_COMMUNITY): Payer: Self-pay | Admitting: Family Medicine

## 2019-08-28 VITALS — BP 136/80 | HR 94 | Ht 69.0 in | Wt 181.9 lb

## 2019-08-28 DIAGNOSIS — Z8719 Personal history of other diseases of the digestive system: Secondary | ICD-10-CM | POA: Insufficient documentation

## 2019-08-28 DIAGNOSIS — K7681 Hepatopulmonary syndrome: Secondary | ICD-10-CM | POA: Diagnosis not present

## 2019-08-28 MED FILL — FUROSEMIDE 20 MG TABS: 20 | 14 days supply | Qty: 28 | Fill #0

## 2019-08-28 NOTE — Progress Notes (Signed)
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  acyclovir (ZOVIRAX) 400 MG tablet, Take 400 mg by mouth in the morning and at bedtime., Disp: , Rfl:  .  aspirin EC 81 MG tablet, Take 81 mg by mouth daily. Swallow whole., Disp: , Rfl:  .  calcium citrate-vitamin D (CITRACAL+D) 315-200 MG-UNIT tablet, Take by mouth., Disp: , Rfl:  .  hydrocortisone 2.5 % cream, APPLY TWICE A DAY TO SCALING AREAS OF BROWS WHEN NEEDED. MAY APPLY TO SCALING INSIDE OF EARS AS WELL, Disp: , Rfl:  .  acyclovir (ZOVIRAX) 400 MG tablet, Take 400 mg by mouth 2 (two) times daily., Disp: , Rfl:  .  clotrimazole (MYCELEX) 10 MG troche, , Disp: , Rfl:  .  iron sucrose (VENOFER) 20 MG/ML injection, Inject 750 mg into the vein. Every 60 days (Patient not taking: Reported on 08/28/2019), Disp: , Rfl:  .  lactulose (CHRONULAC) 10 GM/15ML solution, Take 13.3333 g by mouth 2 (two) times a day. (Patient not taking: Reported on 08/14/2019), Disp: , Rfl:  .  metoprolol tartrate (LOPRESSOR) 25 MG tablet, TAKE 0.5 TABLETS (12.5 MG TOTAL) BY MOUTH 2 (TWO) TIMES DAILY. (Patient not taking: Reported on 08/14/2019), Disp: 90 tablet, Rfl: 1 .  mycophenolate (CELLCEPT) 250 MG capsule, Take 250 mg by mouth 2 (two) times daily. 4 capsules ( total 1000 mg ) every 12 hours, Disp: , Rfl:  .  OCTREOTIDE ACETATE IJ, Inject 30 mcg into the vein every 30 (thirty) days. , Disp: , Rfl:  .  oxyCODONE (OXY IR/ROXICODONE) 5 MG immediate release tablet, Take 5 mg by mouth  5 (five) times daily as needed., Disp: , Rfl:  .  pantoprazole (PROTONIX) 40 MG tablet, Take 1 tablet (40 mg total) by mouth 2 (two) times daily before a meal. TAKE 1 TABLET BY MOUTH TWICE DAILY BEFORE A MEAL, Disp: 180 tablet, Rfl: 1 .  potassium chloride SA (KLOR-CON) 20 MEQ tablet, Take 1 tablet (20 mEq total) by mouth 2 (two) times daily. TAKE 1 TABLET (20 MEQ TOTAL) BY MOUTH TWICE A DAY (Patient not taking: Reported on 08/14/2019), Disp: 180 tablet, Rfl: 1 .  spironolactone (ALDACTONE) 25 MG tablet, TAKE 0.5 TABLET (12.5 MG TOTAL) BY MOUTH DAILY., Disp: 45 tablet, Rfl: 1 .  spironolactone (ALDACTONE) 25 MG tablet, Take by mouth. (Patient not taking: Reported on 08/14/2019), Disp: , Rfl:  .  sulfamethoxazole-trimethoprim (BACTRIM DS) 800-160 MG tablet, Take 1 tablet by mouth 3 (three) times a week. Monday , Wednesday  And Friday, Disp: , Rfl:  .  tacrolimus (PROGRAF) 1 MG capsule, Take 1 mg by mouth 2 (two) times daily. Take 4 tablets in am and 5 in evening, Disp: , Rfl:  .  torsemide (DEMADEX) 20 MG tablet, TAKE 2 TABLETS (40 MG TOTAL) BY MOUTH DAILY. (Patient not taking: Reported on 08/14/2019), Disp: 180 tablet, Rfl: 1 .  tranexamic acid (LYSTEDA) 650 MG TABS tablet, TAKE 1 TABLET BY MOUTH TWICE DAILY BEFORE A MEAL, Disp: 60 tablet,  Rfl: 2 No current facility-administered medications for this encounter.  Facility-Administered Medications Ordered in Other Encounters:  .  heparin lock flush 100 unit/mL, 500 Units, Intracatheter, Daily PRN, Truitt Merle, MD .  sodium chloride flush (NS) 0.9 % injection 10 mL, 10 mL, Intracatheter, PRN, Truitt Merle, MD  Past Medical History: Past Medical History:  Diagnosis Date  . AAA (abdominal aortic aneurysm) ([place]) 03/2014   repaired 2020  . Bilateral renal cysts    simple (03/2014 MRI)  . CAD (coronary artery disease)   . Cholelithiases 2018   asymptomatic  . Chronic diastolic heart failure ([place])   . 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  Hx of portal HTN changes with mild ascites and splenomegaly.  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 Diabetes mellitus with complication ([place]) 36/6294   A1c 6.8%  . History of blood transfusion 2018 X 4 dates   "low blood" (09/21/2016)  . Hyperlipemia, mixed    elevated LFTs when on statins.    . Hypertension    Cr bump 04/01/16 so I changed benicar-hct to benicar plain and added amlodipine 5 mg.  . Iron deficiency anemia 05/2016   Acute blood loss anemia: hospitalized, required transfusion x 3 U: colonoscopy and capsule study unrevealing.  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  W/u for ? hemolytic anemia to be pursued by hematologist as outpt.  Dr. [name], GI at [facility] following, too---he rec'd onc do bone marrow bx as of Jan 2019  . Iron deficiency anemia due to chronic blood loss 2018/19   GI: transfusions x >20 required; multiple endoscopies and bleeding scans unrevealing. Lysteda and octreotide + monthly iron infusions as of 04/2017. Iron infusions changed to every other month as of 09/2018 hem f/u.  [name] Liver failure ([place]) 2020   NASH cirrhosis-->pt began April 2020 undergoing long eval for placement on liver transplant list at [facility].  . Lung field abnormal finding on examination    Bibasilar L>R mild insp crackles-->x-ray showed mild interstitial changes/fibrosis/scarring.  Changes noted on all CXRs in 2018/2019.  Liver Transplant eval 03/2018->mild restriction on PFTs but no obstruction.  See further details in [place] "pulm fibrosis" section  . Microscopic hematuria    Eval unremarkable by Dr. Eulogio Ditch.  . 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 Obesity   . Pulmonary fibrosis ([facility])    PFTs: restrictive lung dz ([facility] transplant eval 05/2018). 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 Pulm rehab helping as of 11/2018.  [name] Spleen enlarged     Tobacco Use: Social History   Tobacco Use  Smoking Status Former Smoker  . Types: Cigarettes  Smokeless Tobacco Never Used  Tobacco Comment   QUIT I 1983    Labs: Recent Review Flowsheet Data    Labs for ITP Cardiac and Pulmonary Rehab Latest Ref Rng & Units 01/04/2017 01/25/2017 04/05/2017 01/26/2018 05/15/2018   Cholestrol 100 - 199 mg/dL - 162 - - -   LDLCALC 0 - 99 mg/dL - 100(H) - - -   HDL >39 mg/dL - 47 - - -   Trlycerides 0 - 149 mg/dL - 76 - - -   Hemoglobin A1c - 5.5 - 4.6 4.7  4.7      Capillary Blood Glucose: Lab Results  Component Value Date   GLUCAP 154 (H) 05/11/2017   GLUCAP 109 (H) 12/01/2016   GLUCAP 127 (H) 12/01/2016   GLUCAP 122 (H) 09/23/2016   GLUCAP 162 (H) 09/22/2016     Pulmonary Assessment Scores:  Pulmonary Assessment Scores    Row Name 08/28/19 1113         mMRC Score   mMRC Score 2           UCSD: Self-administered rating of dyspnea associated with activities of daily living (ADLs) 6-point scale (0 = "not at all" to 5 = "maximal or unable to do because of breathlessness")  Scoring Scores range from 0 to 120.  Minimally important difference is 5 units  CAT: CAT can identify the health impairment of COPD patients and is better correlated with disease progression.  CAT has a scoring range of zero to 40. The CAT score is classified into four groups of low (less than 10), medium (10 - 20), high (21-30) and very high (31-40) based on the impact level of disease on health status. A CAT score over 10 suggests significant symptoms.  A worsening CAT score could be explained by an exacerbation, poor medication adherence, poor inhaler technique, or progression of COPD or comorbid conditions.  CAT MCID is 2 points  mMRC: mMRC (Modified Medical Research Council) Dyspnea Scale is used to  assess the degree of baseline functional disability in patients of respiratory disease due to dyspnea. No minimal important difference is established. A decrease in score of 1 point or greater is considered a positive change.   Pulmonary Function Assessment:  Pulmonary Function Assessment - 08/28/19 1142      Breath   Bilateral Breath Sounds Clear    Shortness of Breath Yes;Limiting activity           Exercise Target Goals: Exercise Program Goal: Individual exercise prescription set using results from initial 6 min walk test and THRR while considering  patient's activity barriers and safety.   Exercise Prescription Goal: Initial exercise prescription builds to 30-45 minutes a day of aerobic activity, 2-3 days per week.  Home exercise guidelines will be given to patient during program as part of exercise prescription that the participant will acknowledge.  Activity Barriers & Risk Stratification:  Activity Barriers & Cardiac Risk Stratification - 08/28/19 1059      Activity Barriers & Cardiac Risk Stratification   Activity Barriers Deconditioning;Muscular Weakness;Shortness of Breath           6 Minute Walk:  6 Minute Walk    Row Name 08/28/19 1120         6 Minute Walk   Phase Initial     Distance 1218 feet     Walk Time 6 minutes     # of Rest Breaks 0     MPH 2.31     METS 3.11     RPE 12     Perceived Dyspnea  2     VO2 Peak 10.9     Symptoms No     Resting HR 83 bpm     Resting BP 136/80     Resting Oxygen Saturation  96 %     Exercise Oxygen Saturation  during 6 min walk 93 %     Max Ex. HR 119 bpm     Max Ex. BP 136/76     2 Minute Post BP 148/82  Interval HR   1 Minute HR 112     2 Minute HR 116     3 Minute HR 116     4 Minute HR 117     5 Minute HR 116     6 Minute HR 119     2 Minute Post HR 97     Interval Heart Rate? Yes       Interval Oxygen   Interval Oxygen? Yes     Baseline Oxygen Saturation % 96 %     1 Minute Oxygen  Saturation % 94 %     1 Minute Liters of Oxygen 0 L     2 Minute Oxygen Saturation % 94 %     2 Minute Liters of Oxygen 0 L     3 Minute Oxygen Saturation % 94 %     3 Minute Liters of Oxygen 0 L     4 Minute Oxygen Saturation % 94 %     4 Minute Liters of Oxygen 0 L     5 Minute Oxygen Saturation % 93 %     5 Minute Liters of Oxygen 0 L     6 Minute Oxygen Saturation % 93 %     6 Minute Liters of Oxygen 0 L     2 Minute Post Oxygen Saturation % 94 %     2 Minute Post Liters of Oxygen 0 L            Oxygen Initial Assessment:  Oxygen Initial Assessment - 08/28/19 1113      Home Oxygen   Home Oxygen Device Portable Concentrator;E-Tanks    Sleep Oxygen Prescription None    Home Exercise Oxygen Prescription None   received liver transplant 07/2019 and requires less oxygen every week, currently not using any oxygen at home.   Home at Rest Exercise Oxygen Prescription None    Compliance with Home Oxygen Use Yes      Initial 6 min Walk   Oxygen Used None      Program Oxygen Prescription   Program Oxygen Prescription None      Intervention   Short Term Goals To learn and exhibit compliance with exercise, home and travel O2 prescription;To learn and understand importance of maintaining oxygen saturations>88%;To learn and demonstrate proper use of respiratory medications;To learn and understand importance of monitoring SPO2 with pulse oximeter and demonstrate accurate use of the pulse oximeter.;To learn and demonstrate proper pursed lip breathing techniques or other breathing techniques.    Long  Term Goals Exhibits compliance with exercise, home and travel O2 prescription;Verbalizes importance of monitoring SPO2 with pulse oximeter and return demonstration;Maintenance of O2 saturations>88%;Compliance with respiratory medication;Exhibits proper breathing techniques, such as pursed lip breathing or other method taught during program session           Oxygen  Re-Evaluation:   Oxygen Discharge (Final Oxygen Re-Evaluation):   Initial Exercise Prescription:  Initial Exercise Prescription - 08/28/19 1500      Date of Initial Exercise RX and Referring Provider   Date 08/28/19    Referring Provider [name], [name], MD      Recumbant Bike   Level 2    Watts 20    Minutes 15    METs 2.76      Track   Laps 13    Minutes 15    METs 2.51      Prescription Details   Frequency (times per week) 2    Duration Progress to  30 minutes of continuous aerobic without signs/symptoms of physical distress      Intensity   THRR 40-80% of Max Heartrate 60-121    Ratings of Perceived Exertion 11-13    Perceived Dyspnea 0-4      Progression   Progression Continue to progress workloads to maintain intensity without signs/symptoms of physical distress.      Resistance Training   Training Prescription No           Perform Capillary Blood Glucose checks as needed.  Exercise Prescription Changes:   Exercise Comments:   Exercise Goals and Review:  Exercise Goals    Row Name 08/28/19 1130             Exercise Goals   Increase Physical Activity Yes       Intervention Provide advice, education, support and counseling about physical activity/exercise needs.;Develop an individualized exercise prescription for aerobic and resistive training based on initial evaluation findings, risk stratification, comorbidities and participant's personal goals.       Expected Outcomes Short Term: Attend rehab on a regular basis to increase amount of physical activity.;Long Term: Exercising regularly at least 3-5 days a week.;Long Term: Add in home exercise to make exercise part of routine and to increase amount of physical activity.       Increase Strength and Stamina Yes       Intervention Provide advice, education, support and counseling about physical activity/exercise needs.;Develop an individualized exercise prescription for aerobic and resistive training  based on initial evaluation findings, risk stratification, comorbidities and participant's personal goals.       Expected Outcomes Short Term: Increase workloads from initial exercise prescription for resistance, speed, and METs.;Short Term: Perform resistance training exercises routinely during rehab and add in resistance training at home;Long Term: Improve cardiorespiratory fitness, muscular endurance and strength as measured by increased METs and functional capacity (6MWT)       Able to understand and use rate of perceived exertion (RPE) scale Yes       Intervention Provide education and explanation on how to use RPE scale       Expected Outcomes Short Term: Able to use RPE daily in rehab to express subjective intensity level;Long Term:  Able to use RPE to guide intensity level when exercising independently       Able to understand and use Dyspnea scale Yes       Intervention Provide education and explanation on how to use Dyspnea scale       Expected Outcomes Short Term: Able to use Dyspnea scale daily in rehab to express subjective sense of shortness of breath during exertion;Long Term: Able to use Dyspnea scale to guide intensity level when exercising independently       Knowledge and understanding of Target Heart Rate Range (THRR) Yes       Intervention Provide education and explanation of THRR including how the numbers were predicted and where they are located for reference       Expected Outcomes Short Term: Able to state/look up THRR;Long Term: Able to use THRR to govern intensity when exercising independently;Short Term: Able to use daily as guideline for intensity in rehab       Understanding of Exercise Prescription Yes       Intervention Provide education, explanation, and written materials on patient's individual exercise prescription       Expected Outcomes Short Term: Able to explain program exercise prescription;Long Term: Able to explain home exercise prescription to exercise  independently  Exercise Goals Re-Evaluation :   Discharge Exercise Prescription (Final Exercise Prescription Changes):   Nutrition:  Target Goals: Understanding of nutrition guidelines, daily intake of sodium <151m, cholesterol <2080m calories 30% from fat and 7% or less from saturated fats, daily to have 5 or more servings of fruits and vegetables.  Biometrics:  Pre Biometrics - 08/28/19 1100      Pre Biometrics   Height 5' 9"  (1.753 m)    Weight 82.5 kg    BMI (Calculated) 26.85    Grip Strength 24 kg            Nutrition Therapy Plan and Nutrition Goals:   Nutrition Assessments:   Nutrition Goals Re-Evaluation:   Nutrition Goals Discharge (Final Nutrition Goals Re-Evaluation):   Psychosocial: Target Goals: Acknowledge presence or absence of significant depression and/or stress, maximize coping skills, provide positive support system. Participant is able to verbalize types and ability to use techniques and skills needed for reducing stress and depression.  Initial Review & Psychosocial Screening:  Initial Psych Review & Screening - 08/28/19 1143      Initial Review   Current issues with None Identified      Family Dynamics   Good Support System? Yes      Barriers   Psychosocial barriers to participate in program There are no identifiable barriers or psychosocial needs.      Screening Interventions   Interventions Encouraged to exercise           Quality of Life Scores:  Scores of 19 and below usually indicate a poorer quality of life in these areas.  A difference of  2-3 points is a clinically meaningful difference.  A difference of 2-3 points in the total score of the Quality of Life Index has been associated with significant improvement in overall quality of life, self-image, physical symptoms, and general health in studies assessing change in quality of life.  PHQ-9: Recent Review Flowsheet Data    [facility]/9  08/28/2019 10/18/2018 10/18/2018 01/26/2018 04/05/2017   Decreased Interest 0 - 0 0 2   Down, Depressed, Hopeless 0 0 0 0 0   PHQ - 2 Score 0 0 0 0 2   Altered sleeping 0 0 - 1 0   Tired, decreased energy 0 0 - 1 1   Change in appetite 0 0 - 0 1   Feeling bad or failure about yourself  0 0 - 0 0   Trouble concentrating 0 0 - 0 0   Moving slowly or fidgety/restless 0 0 - 0 0   Suicidal thoughts 0 0 - 0 0   PHQ-9 Score 0 0 - 2 4   Difficult doing work/chores - Not difficult at all - Not difficult at all Not difficult at all     Interpretation of Total Score  Total Score Depression Severity:  1-4 = Minimal depression, 5-9 = Mild depression, 10-14 = Moderate depression, 15-19 = Moderately severe depression, 20-27 = Severe depression   Psychosocial Evaluation and Intervention:  Psychosocial Evaluation - 08/28/19 1143      Psychosocial Evaluation & Interventions   Interventions Encouraged to exercise with the program and follow exercise prescription    Comments No barriers or concerns were identified at this time.    Expected Outcomes For [place] to continue to be free of barriers or psychosocial concerns while participating in pulmonary rehab.    Continue Psychosocial Services  No Follow up required  Psychosocial Re-Evaluation:  Psychosocial Re-Evaluation    [place] Name 08/28/19 1144             Psychosocial Re-Evaluation   Current issues with None Identified              Psychosocial Discharge (Final Psychosocial Re-Evaluation):  Psychosocial Re-Evaluation - 08/28/19 1144      Psychosocial Re-Evaluation   Current issues with None Identified           Education: Education Goals: Education classes will be provided on a weekly basis, covering required topics. Participant will state understanding/return demonstration of topics presented.  Learning Barriers/Preferences:  Learning Barriers/Preferences - 08/28/19 1145      Learning Barriers/Preferences   Learning  Barriers None    Learning Preferences Audio;Group Instruction;Individual Instruction;Pictoral;Skilled Demonstration;Verbal Instruction;Video;Written Material           Education Topics: Risk Factor Reduction:  -Group instruction that is supported by a PowerPoint presentation. Instructor discusses the definition of a risk factor, different risk factors for pulmonary disease, and how the heart and lungs work together.     PULMONARY REHAB OTHER RESPIRATORY from 01/29/2019 in [place]  Date 01/08/19  Educator --  [Handout]      Nutrition for Pulmonary Patient:  -Group instruction provided by PowerPoint slides, verbal discussion, and written materials to support subject matter. The instructor gives an explanation and review of healthy diet recommendations, which includes a discussion on weight management, recommendations for fruit and vegetable consumption, as well as protein, fluid, caffeine, fiber, sodium, sugar, and alcohol. Tips for eating when patients are short of breath are discussed.   Pursed Lip Breathing:  -Group instruction that is supported by demonstration and informational handouts. Instructor discusses the benefits of pursed lip and diaphragmatic breathing and detailed demonstration on how to preform both.     Oxygen Safety:  -Group instruction provided by PowerPoint, verbal discussion, and written material to support subject matter. There is an overview of "What is Oxygen" and "Why do we need it".  Instructor also reviews how to create a safe environment for oxygen use, the importance of using oxygen as prescribed, and the risks of noncompliance. There is a brief discussion on traveling with oxygen and resources the patient may utilize.   Oxygen Equipment:  -Group instruction provided by [facility] Staff utilizing handouts, written materials, and equipment demonstrations.   Signs and Symptoms:  -Group instruction provided by written  material and verbal discussion to support subject matter. Warning signs and symptoms of infection, stroke, and heart attack are reviewed and when to call the physician/911 reinforced. Tips for preventing the spread of infection discussed.   Advanced Directives:  -Group instruction provided by verbal instruction and written material to support subject matter. Instructor reviews Advanced Directive laws and proper instruction for filling out document.   Pulmonary Video:  -Group video education that reviews the importance of medication and oxygen compliance, exercise, good nutrition, pulmonary hygiene, and pursed lip and diaphragmatic breathing for the pulmonary patient.   Exercise for the Pulmonary Patient:  -Group instruction that is supported by a PowerPoint presentation. Instructor discusses benefits of exercise, core components of exercise, frequency, duration, and intensity of an exercise routine, importance of utilizing pulse oximetry during exercise, safety while exercising, and options of places to exercise outside of rehab.     Pulmonary Medications:  -Verbally interactive group education provided by instructor with focus on inhaled medications and proper administration.   PULMONARY REHAB OTHER RESPIRATORY from 01/29/2019  in [place]  Date 01/24/19  Educator --  [Handout]      Anatomy and Physiology of the Respiratory System and Intimacy:  -Group instruction provided by PowerPoint, verbal discussion, and written material to support subject matter. Instructor reviews respiratory cycle and anatomical components of the respiratory system and their functions. Instructor also reviews differences in obstructive and restrictive respiratory diseases with examples of each. Intimacy, Sex, and Sexuality differences are reviewed with a discussion on how relationships can change when diagnosed with pulmonary disease. Common sexual concerns are reviewed.   MD  DAY -A group question and answer session with a medical doctor that allows participants to ask questions that relate to their pulmonary disease state.   OTHER EDUCATION -Group or individual verbal, written, or video instructions that support the educational goals of the pulmonary rehab program.   PULMONARY REHAB OTHER RESPIRATORY from 01/29/2019 in [place]  Date 01/17/19  [Meditation handout]  Educator --  [Handout]      Holiday Eating Survival Tips:  -Group instruction provided by PowerPoint slides, verbal discussion, and written materials to support subject matter. The instructor gives patients tips, tricks, and techniques to help them not only survive but enjoy the holidays despite the onslaught of food that accompanies the holidays.   PULMONARY REHAB OTHER RESPIRATORY from 01/29/2019 in [place]  Date 12/25/18  Educator 01/08/19  [Handout]      Knowledge Questionnaire Score:   Core Components/Risk Factors/Patient Goals at Admission:  Personal Goals and Risk Factors at Admission - 08/28/19 1145      Core Components/Risk Factors/Patient Goals on Admission   Improve shortness of breath with ADL's Yes    Intervention Provide education, individualized exercise plan and daily activity instruction to help decrease symptoms of SOB with activities of daily living.    Expected Outcomes Short Term: Improve cardiorespiratory fitness to achieve a reduction of symptoms when performing ADLs;Long Term: Be able to perform more ADLs without symptoms or delay the onset of symptoms           Core Components/Risk Factors/Patient Goals Review:   Goals and Risk Factor Review    Row Name 08/28/19 1145             Core Components/Risk Factors/Patient Goals Review   Personal Goals Review Increase knowledge of respiratory medications and ability to use respiratory devices properly.;Improve shortness of breath with ADL's;Develop  more efficient breathing techniques such as purse lipped breathing and diaphragmatic breathing and practicing self-pacing with activity.              Core Components/Risk Factors/Patient Goals at Discharge (Final Review):   Goals and Risk Factor Review - 08/28/19 1145      Core Components/Risk Factors/Patient Goals Review   Personal Goals Review Increase knowledge of respiratory medications and ability to use respiratory devices properly.;Improve shortness of breath with ADL's;Develop more efficient breathing techniques such as purse lipped breathing and diaphragmatic breathing and practicing self-pacing with activity.           ITP Comments:   Comments: Patient restricted from lifting more than 5 lbs at this time. No arm exercises at this time.

## 2019-08-28 NOTE — Progress Notes (Signed)
William [name] 69 y.o. male Pulmonary Rehab Orientation Note Patient arrived today in Cardiac and Pulmonary Rehab for orientation to Pulmonary Rehab. He was dropped off at the [facility] entrance. He does not carry portable oxygen today.  [name]'s supplemental oxygen needs have decrease since his liver transplant done at [facility] 07/2019.  He has hepatopulmonary syndrome and presently is not using oxygen at all.  He is aware to keep his spo2 88% or above Per pt, he uses oxygen never in the last week. Color good, skin warm and dry. Patient is oriented to time and place. Patient's medical history, psychosocial health, and medications reviewed. Psychosocial assessment reveals pt lives with their spouse. Pt is currently retired. Pt reports his stress level is low.  Pt does not exhibit signs of depression. PHQ2/9 score 0/0. Pt shows good  coping skills with positive outlook .  Will continue to monitor and evaluate progress toward psychosocial goal(s) of continued mental wellbeing while participating in pulmonary rehab. Physical assessment reveals heart rate is normal, breath sounds clear to auscultation, no wheezes, rales, or rhonchi. Grip strength equal, strong. Patient reports hedoes take medications as prescribed. Patient states he follows a high protein diet s/p liver transplant per [facility] diet. he has lost 30 pounds post transplant and does not want to lose more.. Patient's weight will be monitored closely. Demonstration and practice of PLB using pulse oximeter. Patient able to return demonstration satisfactorily. Safety and hand hygiene in the exercise area reviewed with patient. Patient voices understanding of the information reviewed. Department expectations discussed with patient and achievable goals were set. The patient shows enthusiasm about attending the program and we look forward to working with this nice gentleman. The patient completed a 6 min walk test today, 08/28/2019 and to begin exercise on Tuesday, 09/03/2019  in the 1315 exercise slot.  1020-1200

## 2019-08-29 DIAGNOSIS — Z944 Liver transplant status: Secondary | ICD-10-CM | POA: Diagnosis not present

## 2019-08-29 DIAGNOSIS — J181 Lobar pneumonia, unspecified organism: Secondary | ICD-10-CM | POA: Diagnosis not present

## 2019-08-29 DIAGNOSIS — D849 Immunodeficiency, unspecified: Secondary | ICD-10-CM | POA: Diagnosis not present

## 2019-08-31 MED FILL — SULFAMETHOXAZOLE-TMP DS TAB: 800-160 | 30 days supply | Qty: 13 | Fill #0

## 2019-08-31 MED FILL — predniSONE 5 MG TABS: 5 | 30 days supply | Qty: 105 | Fill #0

## 2019-08-31 MED FILL — MYCOPHENOLATE 250 MG CAP: 250 | 30 days supply | Qty: 240 | Fill #0

## 2019-08-31 MED FILL — CLOTRIMAZOLE 10 MG TROCHE: 10 | 30 days supply | Qty: 60 | Fill #0

## 2019-09-03 ENCOUNTER — Ambulatory Visit: Payer: 59

## 2019-09-03 ENCOUNTER — Encounter (HOSPITAL_COMMUNITY): Payer: 59

## 2019-09-03 DIAGNOSIS — D849 Immunodeficiency, unspecified: Secondary | ICD-10-CM | POA: Diagnosis not present

## 2019-09-03 DIAGNOSIS — N179 Acute kidney failure, unspecified: Secondary | ICD-10-CM | POA: Diagnosis not present

## 2019-09-03 DIAGNOSIS — D84821 Immunodeficiency due to drugs: Secondary | ICD-10-CM | POA: Diagnosis not present

## 2019-09-03 DIAGNOSIS — Z944 Liver transplant status: Secondary | ICD-10-CM | POA: Diagnosis not present

## 2019-09-03 DIAGNOSIS — Z8719 Personal history of other diseases of the digestive system: Secondary | ICD-10-CM | POA: Diagnosis not present

## 2019-09-03 DIAGNOSIS — Z79899 Other long term (current) drug therapy: Secondary | ICD-10-CM | POA: Diagnosis not present

## 2019-09-03 DIAGNOSIS — Z4823 Encounter for aftercare following liver transplant: Secondary | ICD-10-CM | POA: Diagnosis not present

## 2019-09-03 DIAGNOSIS — N1832 Chronic kidney disease, stage 3b: Secondary | ICD-10-CM | POA: Diagnosis not present

## 2019-09-03 DIAGNOSIS — M5489 Other dorsalgia: Secondary | ICD-10-CM | POA: Diagnosis not present

## 2019-09-03 DIAGNOSIS — I129 Hypertensive chronic kidney disease with stage 1 through stage 4 chronic kidney disease, or unspecified chronic kidney disease: Secondary | ICD-10-CM | POA: Diagnosis not present

## 2019-09-03 DIAGNOSIS — Z951 Presence of aortocoronary bypass graft: Secondary | ICD-10-CM | POA: Diagnosis not present

## 2019-09-03 DIAGNOSIS — Z85828 Personal history of other malignant neoplasm of skin: Secondary | ICD-10-CM | POA: Diagnosis not present

## 2019-09-03 DIAGNOSIS — D72829 Elevated white blood cell count, unspecified: Secondary | ICD-10-CM | POA: Diagnosis not present

## 2019-09-03 LAB — HEPATIC FUNCTION PANEL
ALT: 11 (ref 10–40)
AST: 12 — AB (ref 14–40)
Alkaline Phosphatase: 78 (ref 25–125)
Bilirubin, Total: 1.5

## 2019-09-03 LAB — BASIC METABOLIC PANEL
BUN: 18 (ref 4–21)
CO2: 24 — AB (ref 13–22)
Chloride: 98 — AB (ref 99–108)
Creatinine: 1.5 — AB (ref 0.6–1.3)
Glucose: 136
Potassium: 4.7 (ref 3.4–5.3)
Sodium: 133 — AB (ref 137–147)

## 2019-09-03 LAB — COMPREHENSIVE METABOLIC PANEL
Albumin: 3.7 (ref 3.5–5.0)
Calcium: 9.1 (ref 8.7–10.7)

## 2019-09-03 LAB — CBC AND DIFFERENTIAL
HCT: 37 — AB (ref 41–53)
Hemoglobin: 12.2 — AB (ref 13.5–17.5)
Neutrophils Absolute: 8
Platelets: 231 (ref 150–399)
WBC: 13.9

## 2019-09-03 LAB — CBC: RBC: 3.89 (ref 3.87–5.11)

## 2019-09-03 MED FILL — METOPROLOL TARTRATE 25 MG T: 25 | 30 days supply | Qty: 30 | Fill #0

## 2019-09-05 ENCOUNTER — Encounter (HOSPITAL_COMMUNITY)
Admission: RE | Admit: 2019-09-05 | Discharge: 2019-09-05 | Disposition: A | Discharge status: Home / Self Care | Payer: 59 | Source: Ambulatory Visit | Attending: Cardiology | Admitting: Cardiology

## 2019-09-05 ENCOUNTER — Other Ambulatory Visit: Payer: Self-pay

## 2019-09-05 ENCOUNTER — Encounter: Payer: Self-pay | Admitting: Family Medicine

## 2019-09-05 DIAGNOSIS — Z8719 Personal history of other diseases of the digestive system: Secondary | ICD-10-CM | POA: Diagnosis not present

## 2019-09-05 DIAGNOSIS — K7681 Hepatopulmonary syndrome: Secondary | ICD-10-CM | POA: Diagnosis not present

## 2019-09-05 NOTE — Progress Notes (Signed)
Daily Session Note  Patient Details  Name: William [name] MRN: [id number] Date of Birth: Oct 17, 1950 Referring Provider:     Pulmonary Rehab Walk Test from 08/28/2019 in [place]  Referring Provider [name], [name], MD      Encounter Date: 09/05/2019  Check In:  Session Check In - 09/05/19 1423      Check-In   Supervising physician immediately available to respond to emergencies [facility] immediately available    Physician(s) Dr. Wyline Copas    Location [facility]    Staff Present Maurice Small, RN, Bjorn Loser, MS, CEP, Exercise Physiologist;Korissa Horsford [name], RN;David Lincoln, MS, EP-C, CCRP    Virtual Visit No    Medication changes reported     No    Fall or balance concerns reported    No    Tobacco Cessation No Change    Warm-up and Cool-down Performed on first and last piece of equipment    Resistance Training Performed Yes    VAD Patient? No    PAD/SET Patient? No      Pain Assessment   Currently in Pain? No/denies    Multiple Pain Sites No           Capillary Blood Glucose: No results found for this or any previous visit (from the past 24 hour(s)).    Social History   Tobacco Use  Smoking Status Former Smoker  . Types: Cigarettes  Smokeless Tobacco Never Used  Tobacco Comment   QUIT I 1983    Goals Met:  Personal goals reviewed No report of cardiac concerns or symptoms Strength training completed today  Goals Unmet:  Not Applicable  Comments: Service time is from 1300 to 1417    Dr. Fransico Him is Medical Director for Cardiac Rehab at [facility].

## 2019-09-07 MED FILL — ACYCLOVIR 400 MG TABLET: 400 | 30 days supply | Qty: 60 | Fill #0

## 2019-09-09 ENCOUNTER — Ambulatory Visit: Payer: 59

## 2019-09-09 ENCOUNTER — Encounter: Payer: Self-pay | Admitting: Family Medicine

## 2019-09-10 ENCOUNTER — Other Ambulatory Visit: Payer: Self-pay

## 2019-09-10 ENCOUNTER — Encounter (HOSPITAL_COMMUNITY)
Admission: RE | Admit: 2019-09-10 | Discharge: 2019-09-10 | Disposition: A | Discharge status: Home / Self Care | Payer: 59 | Source: Ambulatory Visit | Attending: Cardiology | Admitting: Cardiology

## 2019-09-10 DIAGNOSIS — K7681 Hepatopulmonary syndrome: Secondary | ICD-10-CM | POA: Diagnosis not present

## 2019-09-10 DIAGNOSIS — Z944 Liver transplant status: Secondary | ICD-10-CM | POA: Diagnosis not present

## 2019-09-10 DIAGNOSIS — Z8719 Personal history of other diseases of the digestive system: Secondary | ICD-10-CM | POA: Insufficient documentation

## 2019-09-10 DIAGNOSIS — D849 Immunodeficiency, unspecified: Secondary | ICD-10-CM | POA: Diagnosis not present

## 2019-09-10 NOTE — Progress Notes (Signed)
Daily Session Note  Patient Details  Name: William [name] MRN: [id number] Date of Birth: [birth year] Referring Provider:     Pulmonary Rehab Walk Test from 08/28/2019 in [place]  Referring Provider [name], [name], MD      Encounter Date: 09/10/2019  Check In:   Capillary Blood Glucose: No results found for this or any previous visit (from the past 24 hour(s)).    Social History   Tobacco Use  Smoking Status Former Smoker  . Types: Cigarettes  Smokeless Tobacco Never Used  Tobacco Comment   QUIT I 1983    Goals Met:  Exercise tolerated well No report of cardiac concerns or symptoms Strength training completed today  Goals Unmet:  Not Applicable  Comments: Service time is from 1315 to 1430    Dr. Fransico Him is Medical Director for Cardiac Rehab at [facility].

## 2019-09-10 NOTE — Progress Notes (Signed)
William [name] 69 y.o. male Nutrition Note  Visit Diagnosis: Hepatopulmonary syndrome [facility])  Past Medical History:  Diagnosis Date  . AAA (abdominal aortic aneurysm) ([place]) 03/2014   repaired 2020  . Bilateral renal cysts    simple (03/2014 MRI)  . CAD (coronary artery disease)   . Cholelithiases 2018   asymptomatic  . Chronic diastolic heart failure ([place])   . Diabetes mellitus with complication ([place]) 33/5456   A1c 6.8%  . Hepatopulmonary syndrome ([place])    2020/2021  . History of blood transfusion 2018 X 4 dates   "low blood" (09/21/2016)  . History of liver transplant ([place]) 07/2019   [facility]  . Hyperlipemia, mixed    elevated LFTs when on statins.    . Hypertension    Cr bump 04/01/16 so I changed benicar-hct to benicar plain and added amlodipine 5 mg.  . Iron deficiency anemia 05/2016   Acute blood loss anemia: hospitalized, required transfusion x 3 U: colonoscopy and capsule study unrevealing.  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  W/u for ? hemolytic anemia to be pursued by hematologist as outpt.  Dr. [name], GI at [facility] following, too---he rec'd onc do bone marrow bx as of Jan 2019  . Iron deficiency anemia due to chronic blood loss 2018/19   GI: transfusions x >20 required; multiple endoscopies and bleeding scans unrevealing. Lysteda and octreotide + monthly iron infusions as of 04/2017. Iron infusions changed to every other month as of 09/2018 hem f/u.  [name] Liver cirrhosis secondary to NASH ([place]) 05/2016   secondary to NASH; most recent u/s abd 07/2016 showed splenomegaly.  Hx of portal HTN changes with mild ascites and splenomegaly.  Esoph varices, elev bili, elev INR, low alb, hx of hep enceph->liver transplant 07/2019.  . Liver failure ([place]) 2020   NASH cirrhosis-->pt began April 2020 undergoing long eval for placement on liver transplant list at [facility].  . Lung field abnormal finding on  examination    Bibasilar L>R mild insp crackles-->x-ray showed mild interstitial changes/fibrosis/scarring.  Changes noted on all CXRs in 2018/2019.  Liver Transplant eval 03/2018->mild restriction on PFTs but no obstruction.  See further details in [place] "pulm fibrosis" section  . Microscopic hematuria    Eval unremarkable by Dr. Eulogio Ditch.  . 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 Obesity   . Pulmonary fibrosis ([facility])    PFTs: restrictive lung dz ([facility] Liver transplant eval 05/2018). [facility] pulm eval felt this was likely hepatopulmonary syndrome->causing his hypoxia->low likelihood of progression (as of 10/04/18 transplant clinic f/u). Pulm rehab helping as of 2020/2021.  [name] Spleen enlarged      Medications reviewed.   Current Outpatient Medications:  .  acyclovir (ZOVIRAX) 400 MG tablet, Take 400 mg by mouth 2 (two) times daily., Disp: , Rfl:  .  acyclovir (ZOVIRAX) 400 MG tablet, Take 400 mg by mouth in the morning and at bedtime., Disp: , Rfl:  .  aspirin EC 81 MG tablet, Take 81 mg by mouth daily. Swallow whole., Disp: , Rfl:  .  calcium citrate-vitamin D (CITRACAL+D) 315-200 MG-UNIT tablet, Take by mouth., Disp: , Rfl:  .  clotrimazole (MYCELEX) 10 MG troche, , Disp: , Rfl:  .  hydrocortisone 2.5 % cream, APPLY TWICE A DAY TO SCALING AREAS OF BROWS WHEN NEEDED. MAY APPLY TO SCALING INSIDE OF EARS AS WELL, Disp: , Rfl:  .  iron sucrose (  VENOFER) 20 MG/ML injection, Inject 750 mg into the vein. Every 60 days (Patient not taking: Reported on 08/28/2019), Disp: , Rfl:  .  lactulose (CHRONULAC) 10 GM/15ML solution, Take 13.3333 g by mouth 2 (two) times a day. (Patient not taking: Reported on 08/14/2019), Disp: , Rfl:  .  metoprolol tartrate (LOPRESSOR) 25 MG tablet, TAKE 0.5 TABLETS (12.5 MG TOTAL) BY MOUTH 2 (TWO) TIMES DAILY. (Patient not taking: Reported on 08/14/2019), Disp: 90 tablet, Rfl: 1 .  mycophenolate (CELLCEPT) 250 MG capsule, Take 250 mg by mouth 2 (two) times daily. 4  capsules ( total 1000 mg ) every 12 hours, Disp: , Rfl:  .  OCTREOTIDE ACETATE IJ, Inject 30 mcg into the vein every 30 (thirty) days. , Disp: , Rfl:  .  oxyCODONE (OXY IR/ROXICODONE) 5 MG immediate release tablet, Take 5 mg by mouth 5 (five) times daily as needed., Disp: , Rfl:  .  pantoprazole (PROTONIX) 40 MG tablet, Take 1 tablet (40 mg total) by mouth 2 (two) times daily before a meal. TAKE 1 TABLET BY MOUTH TWICE DAILY BEFORE A MEAL, Disp: 180 tablet, Rfl: 1 .  potassium chloride SA (KLOR-CON) 20 MEQ tablet, Take 1 tablet (20 mEq total) by mouth 2 (two) times daily. TAKE 1 TABLET (20 MEQ TOTAL) BY MOUTH TWICE A DAY (Patient not taking: Reported on 08/14/2019), Disp: 180 tablet, Rfl: 1 .  spironolactone (ALDACTONE) 25 MG tablet, TAKE 0.5 TABLET (12.5 MG TOTAL) BY MOUTH DAILY., Disp: 45 tablet, Rfl: 1 .  spironolactone (ALDACTONE) 25 MG tablet, Take by mouth. (Patient not taking: Reported on 08/14/2019), Disp: , Rfl:  .  sulfamethoxazole-trimethoprim (BACTRIM DS) 800-160 MG tablet, Take 1 tablet by mouth 3 (three) times a week. Monday , Wednesday  And Friday, Disp: , Rfl:  .  tacrolimus (PROGRAF) 1 MG capsule, Take 1 mg by mouth 2 (two) times daily. Take 4 tablets in am and 5 in evening, Disp: , Rfl:  .  torsemide (DEMADEX) 20 MG tablet, TAKE 2 TABLETS (40 MG TOTAL) BY MOUTH DAILY. (Patient not taking: Reported on 08/14/2019), Disp: 180 tablet, Rfl: 1 .  tranexamic acid (LYSTEDA) 650 MG TABS tablet, TAKE 1 TABLET BY MOUTH TWICE DAILY BEFORE A MEAL, Disp: 60 tablet, Rfl: 2 No current facility-administered medications for this encounter.  Facility-Administered Medications Ordered in Other Encounters:  .  heparin lock flush 100 unit/mL, 500 Units, Intracatheter, Daily PRN, Truitt Merle, MD .  sodium chloride flush (NS) 0.9 % injection 10 mL, 10 mL, Intracatheter, PRN, Truitt Merle, MD   Ht Readings from Last 1 Encounters:  08/28/19 5' 9"  (1.753 m)     Wt Readings from Last 3 Encounters:  08/28/19 181 lb  14.1 oz (82.5 kg)  06/27/19 210 lb 14.4 oz (95.7 kg)  01/22/19 211 lb 6.7 oz (95.9 kg)     There is no height or weight on file to calculate BMI.   Social History   Tobacco Use  Smoking Status Former Smoker  . Types: Cigarettes  Smokeless Tobacco Never Used  Tobacco Comment   QUIT I 1983     Nutrition Note  Spoke with pt. Nutrition Plan and Nutrition Survey goals reviewed with pt.   Pt with liver transplant 08/02/19. He reports weight at surgery 208 lbs and now 181 lbs. He states some of this loss was fluid. He records daily weights. Goal to gain weight. He states good appetite and diet recall reveals eating 3 meals per day and 2 snacks. He is being  followed by RD from [facility] and they are working towards weight gain as well. He has been eating trail mix and/or nuts between meals.  Pt expressed understanding of the information reviewed.    Nutrition Diagnosis ? Inadequate oral intake related to elevated energy needs post surgery as evidenced by weight loss 30 lbs in past 5 weeks  Nutrition Intervention ? Pt to increase kcal intake by incorporating protein shakes and high protein snacks ? Pt's individual nutrition plan reviewed with pt. ? Benefits of adopting healthy diet reviewed with Rate My Plate survey   ? Continue client-centered nutrition education by RD, as part of interdisciplinary care.  Goal(s) ? Pt to identify food quantities necessary to achieve weight gain 0.25-1 lb per week during time in pulmonary rehab ? Pt to incorporate minimum of 1 protein shake daily and 2 servings nuts/trail mix daily  Plan:   Will provide client-centered nutrition education as part of interdisciplinary care  Monitor and evaluate progress toward nutrition goal with team.   Michaele Offer, MS, RDN, LDN

## 2019-09-12 ENCOUNTER — Other Ambulatory Visit: Payer: Self-pay

## 2019-09-12 ENCOUNTER — Encounter (HOSPITAL_COMMUNITY)
Admission: RE | Admit: 2019-09-12 | Discharge: 2019-09-12 | Disposition: A | Discharge status: Home / Self Care | Payer: 59 | Source: Ambulatory Visit | Attending: Cardiology | Admitting: Cardiology

## 2019-09-12 DIAGNOSIS — K7681 Hepatopulmonary syndrome: Secondary | ICD-10-CM

## 2019-09-12 DIAGNOSIS — Z8719 Personal history of other diseases of the digestive system: Secondary | ICD-10-CM | POA: Diagnosis not present

## 2019-09-12 NOTE — Progress Notes (Signed)
Daily Session Note  Patient Details  Name: William [name] MRN: [id number] Date of Birth: [birth year] Referring Provider:     Pulmonary Rehab Walk Test from 08/28/2019 in [place]  Referring Provider [name], [name], MD      Encounter Date: 09/12/2019  Check In:  Session Check In - 09/12/19 1344      Check-In   Supervising physician immediately available to respond to emergencies [facility] immediately available    Physician(s) Dr. Lupita Dawn    Location [facility]    Staff Present Hoy Register, MS, CEP, Exercise Physiologist;Juliane Guest Jani Gravel, MS, ACSM-CEP, Exercise Physiologist    Virtual Visit No    Medication changes reported     No    Fall or balance concerns reported    No    Tobacco Cessation No Change    Warm-up and Cool-down Performed on first and last piece of equipment    Resistance Training Performed Yes    VAD Patient? No    PAD/SET Patient? No      Pain Assessment   Currently in Pain? No/denies    Multiple Pain Sites No           Capillary Blood Glucose: No results found for this or any previous visit (from the past 24 hour(s)).    Social History   Tobacco Use  Smoking Status Former Smoker  . Types: Cigarettes  Smokeless Tobacco Never Used  Tobacco Comment   QUIT I 1983    Goals Met:  Exercise tolerated well No report of cardiac concerns or symptoms Strength training completed today  Goals Unmet:  Not Applicable  Comments: Service time is from 1315 to 1415 BP during exercise reached 196/84, had pt to rest and then he was able to continue BP at discharge.144/82.   Dr. Fransico Him is Medical Director for Cardiac Rehab at [facility].

## 2019-09-13 ENCOUNTER — Other Ambulatory Visit (HOSPITAL_COMMUNITY): Payer: Self-pay | Admitting: Gastroenterology

## 2019-09-13 MED FILL — TACROLIMUS 1 MG CAPSULE: 1 | 30 days supply | Qty: 180 | Fill #0

## 2019-09-17 ENCOUNTER — Encounter (HOSPITAL_COMMUNITY)
Admission: RE | Admit: 2019-09-17 | Discharge: 2019-09-17 | Disposition: A | Discharge status: Home / Self Care | Payer: 59 | Source: Ambulatory Visit | Attending: Cardiology | Admitting: Cardiology

## 2019-09-17 ENCOUNTER — Other Ambulatory Visit: Payer: Self-pay

## 2019-09-17 VITALS — Wt 181.2 lb

## 2019-09-17 DIAGNOSIS — Z8719 Personal history of other diseases of the digestive system: Secondary | ICD-10-CM | POA: Diagnosis not present

## 2019-09-17 DIAGNOSIS — K7681 Hepatopulmonary syndrome: Secondary | ICD-10-CM

## 2019-09-17 NOTE — Progress Notes (Signed)
Daily Session Note  Patient Details  Name: William [name] MRN: [id number] Date of Birth: 12/01/1950 Referring Provider:     Pulmonary Rehab Walk Test from 08/28/2019 in [place]  Referring Provider [name], [name], MD      Encounter Date: 09/17/2019  Check In:  Session Check In - 09/17/19 1321      Check-In   Supervising physician immediately available to respond to emergencies [facility] immediately available    Physician(s) Dr. Lupita Dawn    Location [facility]    Staff Present Maurice Small, RN, Bjorn Loser, MS, CEP, Exercise Physiologist;Lisa Jani Gravel, MS, ACSM-CEP, Exercise Physiologist    Virtual Visit No    Medication changes reported     No    Fall or balance concerns reported    No    Tobacco Cessation No Change    Warm-up and Cool-down Performed on first and last piece of equipment    Resistance Training Performed Yes    VAD Patient? No    PAD/SET Patient? No      Pain Assessment   Currently in Pain? No/denies    Multiple Pain Sites No           Capillary Blood Glucose: No results found for this or any previous visit (from the past 24 hour(s)).   Exercise Prescription Changes - 09/17/19 1400      Response to Exercise   Blood Pressure (Admit) 138/76    Blood Pressure (Exercise) 146/80    Blood Pressure (Exit) 150/85    Heart Rate (Admit) 84 bpm    Heart Rate (Exercise) 105 bpm    Heart Rate (Exit) 98 bpm    Oxygen Saturation (Admit) 98 %    Oxygen Saturation (Exercise) 92 %    Oxygen Saturation (Exit) 96 %    Rating of Perceived Exertion (Exercise) 13    Perceived Dyspnea (Exercise) 2    Duration Continue with 30 min of aerobic exercise without signs/symptoms of physical distress.    Intensity THRR unchanged      Progression   Progression Continue to progress workloads to maintain intensity without signs/symptoms of physical distress.    Average METs 2.3       Resistance Training   Training Prescription No   On weight restrictions     Recumbant Bike   Level 2    Watts 20    Minutes 15    METs 2.3      Track   Laps 18    Minutes 15    METs 3.09      Home Exercise Plan   Plans to continue exercise at Home (comment)    Frequency Add 3 additional days to program exercise sessions.    Initial Home Exercises Provided 09/17/19           Social History   Tobacco Use  Smoking Status Former Smoker  . Types: Cigarettes  Smokeless Tobacco Never Used  Tobacco Comment   QUIT I 1983    Goals Met:  Proper associated with RPD/PD & O2 Sat Exercise tolerated well Strength training completed today  Goals Unmet:  Not Applicable  Comments: Service time is from 1310 to 1410    Dr. Fransico Him is Medical Director for Cardiac Rehab at [facility].

## 2019-09-17 NOTE — Progress Notes (Signed)
I have reviewed a Home Exercise Prescription with William [name] . William [name] is  currently exercising at home.  The patient was advised to walk 2-3 days a week for 30-45 minutes.  William [name] and I discussed how to progress their exercise prescription.  The patient stated that their goals were to get stronger and increase muscle mass.  The patient stated that they understand the exercise prescription.  We reviewed exercise guidelines, target heart rate during exercise, RPE Scale, weather conditions, NTG use, endpoints for exercise, warmup and cool down.  Patient is encouraged to come to me with any questions. I will continue to follow up with the patient to assist them with progression and safety.

## 2019-09-18 NOTE — Progress Notes (Signed)
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  acyclovir (ZOVIRAX) 400 MG tablet, Take 400 mg by mouth 2 (two) times daily., Disp: , Rfl:  .  acyclovir (ZOVIRAX) 400 MG tablet, Take 400 mg by mouth in the morning and at bedtime., Disp: , Rfl:  .  aspirin EC 81 MG tablet, Take 81 mg by mouth daily. Swallow whole., Disp: , Rfl:  .  calcium citrate-vitamin D (CITRACAL+D) 315-200 MG-UNIT tablet, Take by mouth., Disp: , Rfl:  .  clotrimazole (MYCELEX) 10 MG troche, , Disp: , Rfl:  .  hydrocortisone 2.5 % cream, APPLY TWICE A DAY TO SCALING AREAS OF BROWS WHEN NEEDED. MAY APPLY TO SCALING INSIDE OF EARS AS WELL, Disp: , Rfl:  .  iron sucrose (VENOFER) 20 MG/ML injection, Inject 750 mg into the vein. Every 60 days (Patient not taking: Reported on 08/28/2019), Disp: , Rfl:  .  lactulose (CHRONULAC) 10 GM/15ML solution, Take 13.3333 g by mouth 2 (two) times a day. (Patient not taking: Reported on 08/14/2019), Disp: , Rfl:  .  metoprolol tartrate (LOPRESSOR) 25 MG tablet, TAKE 0.5 TABLETS (12.5 MG TOTAL) BY MOUTH 2 (TWO) TIMES DAILY. (Patient not taking: Reported on 08/14/2019), Disp: 90 tablet, Rfl: 1 .  mycophenolate (CELLCEPT) 250 MG capsule, Take 250 mg by mouth 2 (two) times daily. 4 capsules ( total 1000 mg ) every 12 hours, Disp: , Rfl:  .  OCTREOTIDE ACETATE IJ, Inject 30 mcg into the vein every 30 (thirty) days. , Disp: , Rfl:  .  oxyCODONE (OXY IR/ROXICODONE) 5 MG immediate release tablet, Take 5 mg by mouth  5 (five) times daily as needed., Disp: , Rfl:  .  pantoprazole (PROTONIX) 40 MG tablet, Take 1 tablet (40 mg total) by mouth 2 (two) times daily before a meal. TAKE 1 TABLET BY MOUTH TWICE DAILY BEFORE A MEAL, Disp: 180 tablet, Rfl: 1 .  potassium chloride SA (KLOR-CON) 20 MEQ tablet, Take 1 tablet (20 mEq total) by mouth 2 (two) times daily. TAKE 1 TABLET (20 MEQ TOTAL) BY MOUTH TWICE A DAY (Patient not taking: Reported on 08/14/2019), Disp: 180 tablet, Rfl: 1 .  spironolactone (ALDACTONE) 25 MG tablet, TAKE 0.5 TABLET (12.5 MG TOTAL) BY MOUTH DAILY., Disp: 45 tablet, Rfl: 1 .  spironolactone (ALDACTONE) 25 MG tablet, Take by mouth. (Patient not taking: Reported on 08/14/2019), Disp: , Rfl:  .  sulfamethoxazole-trimethoprim (BACTRIM DS) 800-160 MG tablet, Take 1 tablet by mouth 3 (three) times a week. Monday , Wednesday  And Friday, Disp: , Rfl:  .  tacrolimus (PROGRAF) 1 MG capsule, Take 1 mg by mouth 2 (two) times daily. Take 4 tablets in am and 5 in evening, Disp: , Rfl:  .  torsemide (DEMADEX) 20 MG tablet, TAKE 2 TABLETS (40 MG TOTAL) BY MOUTH DAILY. (Patient not taking: Reported on 08/14/2019), Disp: 180 tablet, Rfl: 1 .  tranexamic acid (LYSTEDA) 650 MG TABS tablet, TAKE 1 TABLET BY MOUTH TWICE DAILY BEFORE A MEAL, Disp: 60 tablet,  Rfl: 2 No current facility-administered medications for this encounter.  Facility-Administered Medications Ordered in Other Encounters:  .  heparin lock flush 100 unit/mL, 500 Units, Intracatheter, Daily PRN, Truitt Merle, MD .  sodium chloride flush (NS) 0.9 % injection 10 mL, 10 mL, Intracatheter, PRN, Truitt Merle, MD  Past Medical History: Past Medical History:  Diagnosis Date  . AAA (abdominal aortic aneurysm) ([place]) 03/2014   repaired 2020  . Bilateral renal cysts    simple (03/2014 MRI)  . CAD (coronary artery disease)   . Cholelithiases 2018   asymptomatic  . Chronic diastolic heart failure ([place])   . Diabetes mellitus with complication ([place]) 17/6160   A1c 6.8%   . 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 History of blood transfusion 2018 X 4 dates   "low blood" (09/21/2016)  . History of liver transplant ([place]) 07/2019   [facility]  . Hyperlipemia, mixed    elevated LFTs when on statins.    . Hypertension    Cr bump 04/01/16 so I changed benicar-hct to benicar plain and added amlodipine 5 mg.  . Iron deficiency anemia 05/2016   Acute blood loss anemia: hospitalized, required transfusion x 3 U: colonoscopy and capsule study unrevealing.  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  W/u for ? hemolytic anemia to be pursued by hematologist as outpt.  Dr. [name], GI at [facility] following, too---he rec'd onc do bone marrow bx as of Jan 2019  . Iron deficiency anemia due to chronic blood loss 2018/19   GI: transfusions x >20 required; multiple endoscopies and bleeding scans unrevealing. Lysteda and octreotide + monthly iron infusions as of 04/2017. Iron infusions changed to every other month as of 09/2018 hem f/u.  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  Hx of portal HTN changes with mild ascites and splenomegaly.  Esoph varices, elev bili, elev INR, low alb, hx of hep enceph->liver transplant 07/2019.  . Liver failure ([place]) 2020   NASH cirrhosis-->pt began April 2020 undergoing long eval for placement on liver transplant list at [facility].  . Lung field abnormal finding on examination    Bibasilar L>R mild insp crackles-->x-ray showed mild interstitial changes/fibrosis/scarring.  Changes noted on all CXRs in 2018/2019.  Liver Transplant eval 03/2018->mild restriction on PFTs but no obstruction.  See further details in [place] "pulm fibrosis" section  . Microscopic hematuria    Eval unremarkable by Dr. Eulogio Ditch.  . 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 Obesity   . Pulmonary fibrosis ([facility])     PFTs: restrictive lung dz ([facility] transplant eval 05/2018). [facility] pulm eval felt this was likely hepatopulmonary syndrome->causing his hypoxia->low likelihood of progression (as of 10/04/18 transplant clinic f/u). Pulm rehab helping as of 2020/2021.  [name] Spleen enlarged     Tobacco Use: Social History   Tobacco Use  Smoking Status Former Smoker  . Types: Cigarettes  Smokeless Tobacco Never Used  Tobacco Comment   QUIT I 1983    Labs: Recent Review Flowsheet Data    Labs for ITP Cardiac and Pulmonary Rehab Latest Ref Rng & Units 01/04/2017 01/25/2017 04/05/2017 01/26/2018 05/15/2018   Cholestrol 100 - 199 mg/dL - 162 - - -   LDLCALC 0 - 99 mg/dL - 100(H) - - -   HDL >39 mg/dL - 47 - - -  Trlycerides 0 - 149 mg/dL - 76 - - -   Hemoglobin A1c - 5.5 - 4.6 4.7 4.7      Capillary Blood Glucose: Lab Results  Component Value Date   GLUCAP 154 (H) 05/11/2017   GLUCAP 109 (H) 12/01/2016   GLUCAP 127 (H) 12/01/2016   GLUCAP 122 (H) 09/23/2016   GLUCAP 162 (H) 09/22/2016     Pulmonary Assessment Scores:  Pulmonary Assessment Scores    Row Name 08/28/19 1113 09/05/19 1504       ADL UCSD   ADL Phase -- Entry    SOB Score total -- 46      CAT Score   CAT Score -- 18      mMRC Score   mMRC Score 2 --          UCSD: Self-administered rating of dyspnea associated with activities of daily living (ADLs) 6-point scale (0 = "not at all" to 5 = "maximal or unable to do because of breathlessness")  Scoring Scores range from 0 to 120.  Minimally important difference is 5 units  CAT: CAT can identify the health impairment of COPD patients and is better correlated with disease progression.  CAT has a scoring range of zero to 40. The CAT score is classified into four groups of low (less than 10), medium (10 - 20), high (21-30) and very high (31-40) based on the impact level of disease on health status. A CAT score over 10 suggests significant symptoms.  A worsening CAT score could  be explained by an exacerbation, poor medication adherence, poor inhaler technique, or progression of COPD or comorbid conditions.  CAT MCID is 2 points  mMRC: mMRC (Modified Medical Research Council) Dyspnea Scale is used to assess the degree of baseline functional disability in patients of respiratory disease due to dyspnea. No minimal important difference is established. A decrease in score of 1 point or greater is considered a positive change.   Pulmonary Function Assessment:  Pulmonary Function Assessment - 08/28/19 1142      Breath   Bilateral Breath Sounds Clear    Shortness of Breath Yes;Limiting activity           Exercise Target Goals: Exercise Program Goal: Individual exercise prescription set using results from initial 6 min walk test and THRR while considering  patient's activity barriers and safety.   Exercise Prescription Goal: Initial exercise prescription builds to 30-45 minutes a day of aerobic activity, 2-3 days per week.  Home exercise guidelines will be given to patient during program as part of exercise prescription that the participant will acknowledge.  Activity Barriers & Risk Stratification:  Activity Barriers & Cardiac Risk Stratification - 08/28/19 1059      Activity Barriers & Cardiac Risk Stratification   Activity Barriers Deconditioning;Muscular Weakness;Shortness of Breath           6 Minute Walk:  6 Minute Walk    Row Name 08/28/19 1120         6 Minute Walk   Phase Initial     Distance 1218 feet     Walk Time 6 minutes     # of Rest Breaks 0     MPH 2.31     METS 3.11     RPE 12     Perceived Dyspnea  2     VO2 Peak 10.9     Symptoms No     Resting HR 83 bpm     Resting BP 136/80  Resting Oxygen Saturation  96 %     Exercise Oxygen Saturation  during 6 min walk 93 %     Max Ex. HR 119 bpm     Max Ex. BP 136/76     2 Minute Post BP 148/82       Interval HR   1 Minute HR 112     2 Minute HR 116     3 Minute HR 116      4 Minute HR 117     5 Minute HR 116     6 Minute HR 119     2 Minute Post HR 97     Interval Heart Rate? Yes       Interval Oxygen   Interval Oxygen? Yes     Baseline Oxygen Saturation % 96 %     1 Minute Oxygen Saturation % 94 %     1 Minute Liters of Oxygen 0 L     2 Minute Oxygen Saturation % 94 %     2 Minute Liters of Oxygen 0 L     3 Minute Oxygen Saturation % 94 %     3 Minute Liters of Oxygen 0 L     4 Minute Oxygen Saturation % 94 %     4 Minute Liters of Oxygen 0 L     5 Minute Oxygen Saturation % 93 %     5 Minute Liters of Oxygen 0 L     6 Minute Oxygen Saturation % 93 %     6 Minute Liters of Oxygen 0 L     2 Minute Post Oxygen Saturation % 94 %     2 Minute Post Liters of Oxygen 0 L            Oxygen Initial Assessment:  Oxygen Initial Assessment - 08/28/19 1113      Home Oxygen   Home Oxygen Device Portable Concentrator;E-Tanks    Sleep Oxygen Prescription None    Home Exercise Oxygen Prescription None   received liver transplant 07/2019 and requires less oxygen every week, currently not using any oxygen at home.   Home at Rest Exercise Oxygen Prescription None    Compliance with Home Oxygen Use Yes      Initial 6 min Walk   Oxygen Used None      Program Oxygen Prescription   Program Oxygen Prescription None      Intervention   Short Term Goals To learn and exhibit compliance with exercise, home and travel O2 prescription;To learn and understand importance of maintaining oxygen saturations>88%;To learn and demonstrate proper use of respiratory medications;To learn and understand importance of monitoring SPO2 with pulse oximeter and demonstrate accurate use of the pulse oximeter.;To learn and demonstrate proper pursed lip breathing techniques or other breathing techniques.    Long  Term Goals Exhibits compliance with exercise, home and travel O2 prescription;Verbalizes importance of monitoring SPO2 with pulse oximeter and return demonstration;Maintenance  of O2 saturations>88%;Compliance with respiratory medication;Exhibits proper breathing techniques, such as pursed lip breathing or other method taught during program session           Oxygen Re-Evaluation:  Oxygen Re-Evaluation    Row Name 09/17/19 1453             Program Oxygen Prescription   Program Oxygen Prescription None         Home Oxygen   Home Oxygen Device None       Sleep Oxygen Prescription None  Home Exercise Oxygen Prescription None       Home at Rest Exercise Oxygen Prescription None       Compliance with Home Oxygen Use Yes         Goals/Expected Outcomes   Short Term Goals To learn and exhibit compliance with exercise, home and travel O2 prescription;To learn and understand importance of monitoring SPO2 with pulse oximeter and demonstrate accurate use of the pulse oximeter.;To learn and understand importance of maintaining oxygen saturations>88%;To learn and demonstrate proper pursed lip breathing techniques or other breathing techniques.;To learn and demonstrate proper use of respiratory medications       Long  Term Goals Exhibits compliance with exercise, home and travel O2 prescription;Verbalizes importance of monitoring SPO2 with pulse oximeter and return demonstration;Maintenance of O2 saturations>88%;Exhibits proper breathing techniques, such as pursed lip breathing or other method taught during program session;Compliance with respiratory medication;Demonstrates proper use of MDI's       Goals/Expected Outcomes compliance              Oxygen Discharge (Final Oxygen Re-Evaluation):  Oxygen Re-Evaluation - 09/17/19 1453      Program Oxygen Prescription   Program Oxygen Prescription None      Home Oxygen   Home Oxygen Device None    Sleep Oxygen Prescription None    Home Exercise Oxygen Prescription None    Home at Rest Exercise Oxygen Prescription None    Compliance with Home Oxygen Use Yes      Goals/Expected Outcomes   Short Term Goals To  learn and exhibit compliance with exercise, home and travel O2 prescription;To learn and understand importance of monitoring SPO2 with pulse oximeter and demonstrate accurate use of the pulse oximeter.;To learn and understand importance of maintaining oxygen saturations>88%;To learn and demonstrate proper pursed lip breathing techniques or other breathing techniques.;To learn and demonstrate proper use of respiratory medications    Long  Term Goals Exhibits compliance with exercise, home and travel O2 prescription;Verbalizes importance of monitoring SPO2 with pulse oximeter and return demonstration;Maintenance of O2 saturations>88%;Exhibits proper breathing techniques, such as pursed lip breathing or other method taught during program session;Compliance with respiratory medication;Demonstrates proper use of MDI's    Goals/Expected Outcomes compliance           Initial Exercise Prescription:  Initial Exercise Prescription - 08/28/19 1500      Date of Initial Exercise RX and Referring Provider   Date 08/28/19    Referring Provider [name], [name], MD      Recumbant Bike   Level 2    Watts 20    Minutes 15    METs 2.76      Track   Laps 13    Minutes 15    METs 2.51      Prescription Details   Frequency (times per week) 2    Duration Progress to 30 minutes of continuous aerobic without signs/symptoms of physical distress      Intensity   THRR 40-80% of Max Heartrate 60-121    Ratings of Perceived Exertion 11-13    Perceived Dyspnea 0-4      Progression   Progression Continue to progress workloads to maintain intensity without signs/symptoms of physical distress.      Resistance Training   Training Prescription No           Perform Capillary Blood Glucose checks as needed.  Exercise Prescription Changes:  Exercise Prescription Changes    Row Name 09/17/19 1400  Response to Exercise   Blood Pressure (Admit) 138/76       Blood Pressure (Exercise)  146/80       Blood Pressure (Exit) 150/85       Heart Rate (Admit) 84 bpm       Heart Rate (Exercise) 105 bpm       Heart Rate (Exit) 98 bpm       Oxygen Saturation (Admit) 98 %       Oxygen Saturation (Exercise) 92 %       Oxygen Saturation (Exit) 96 %       Rating of Perceived Exertion (Exercise) 13       Perceived Dyspnea (Exercise) 2       Duration Continue with 30 min of aerobic exercise without signs/symptoms of physical distress.       Intensity THRR unchanged         Progression   Progression Continue to progress workloads to maintain intensity without signs/symptoms of physical distress.       Average METs 2.3         Resistance Training   Training Prescription No  On weight restrictions         Recumbant Bike   Level 2       Watts 20       Minutes 15       METs 2.3         Track   Laps 18       Minutes 15       METs 3.09         Home Exercise Plan   Plans to continue exercise at Home (comment)       Frequency Add 3 additional days to program exercise sessions.       Initial Home Exercises Provided 09/17/19              Exercise Comments:  Exercise Comments    Row Name 09/05/19 1507 09/17/19 1439 09/18/19 1454       Exercise Comments Pt completed his first day of exercise in pulmonary rehab today. He tolerated exercise well, but experience muscle weakness in his legs while doing squats. He was fatigued at the end of his workout. Otherwise he tolerated his exercise session well. home exercise reviewed Pt walking 30 minutes  mostly 7 days a week.  Pt is doing very well and is off to a wonderful start with the completion of 4 exercose sessions.            Exercise Goals and Review:  Exercise Goals    Row Name 08/28/19 1130 09/17/19 1457           Exercise Goals   Increase Physical Activity Yes Yes      Intervention Provide advice, education, support and counseling about physical activity/exercise needs.;Develop an individualized exercise prescription  for aerobic and resistive training based on initial evaluation findings, risk stratification, comorbidities and participant's personal goals. Provide advice, education, support and counseling about physical activity/exercise needs.;Develop an individualized exercise prescription for aerobic and resistive training based on initial evaluation findings, risk stratification, comorbidities and participant's personal goals.      Expected Outcomes Short Term: Attend rehab on a regular basis to increase amount of physical activity.;Long Term: Exercising regularly at least 3-5 days a week.;Long Term: Add in home exercise to make exercise part of routine and to increase amount of physical activity. Short Term: Attend rehab on a regular basis to increase amount of physical activity.;Long Term:  Add in home exercise to make exercise part of routine and to increase amount of physical activity.;Long Term: Exercising regularly at least 3-5 days a week.      Increase Strength and Stamina Yes Yes      Intervention Provide advice, education, support and counseling about physical activity/exercise needs.;Develop an individualized exercise prescription for aerobic and resistive training based on initial evaluation findings, risk stratification, comorbidities and participant's personal goals. Provide advice, education, support and counseling about physical activity/exercise needs.;Develop an individualized exercise prescription for aerobic and resistive training based on initial evaluation findings, risk stratification, comorbidities and participant's personal goals.      Expected Outcomes Short Term: Increase workloads from initial exercise prescription for resistance, speed, and METs.;Short Term: Perform resistance training exercises routinely during rehab and add in resistance training at home;Long Term: Improve cardiorespiratory fitness, muscular endurance and strength as measured by increased METs and functional capacity (6MWT)  Short Term: Increase workloads from initial exercise prescription for resistance, speed, and METs.;Short Term: Perform resistance training exercises routinely during rehab and add in resistance training at home;Long Term: Improve cardiorespiratory fitness, muscular endurance and strength as measured by increased METs and functional capacity (6MWT)      Able to understand and use rate of perceived exertion (RPE) scale Yes Yes      Intervention Provide education and explanation on how to use RPE scale Provide education and explanation on how to use RPE scale      Expected Outcomes Short Term: Able to use RPE daily in rehab to express subjective intensity level;Long Term:  Able to use RPE to guide intensity level when exercising independently Short Term: Able to use RPE daily in rehab to express subjective intensity level;Long Term:  Able to use RPE to guide intensity level when exercising independently      Able to understand and use Dyspnea scale Yes Yes      Intervention Provide education and explanation on how to use Dyspnea scale Provide education and explanation on how to use Dyspnea scale      Expected Outcomes Short Term: Able to use Dyspnea scale daily in rehab to express subjective sense of shortness of breath during exertion;Long Term: Able to use Dyspnea scale to guide intensity level when exercising independently Short Term: Able to use Dyspnea scale daily in rehab to express subjective sense of shortness of breath during exertion;Long Term: Able to use Dyspnea scale to guide intensity level when exercising independently      Knowledge and understanding of Target Heart Rate Range (THRR) Yes Yes      Intervention Provide education and explanation of THRR including how the numbers were predicted and where they are located for reference Provide education and explanation of THRR including how the numbers were predicted and where they are located for reference      Expected Outcomes Short Term: Able to  state/look up THRR;Long Term: Able to use THRR to govern intensity when exercising independently;Short Term: Able to use daily as guideline for intensity in rehab Short Term: Able to state/look up THRR;Long Term: Able to use THRR to govern intensity when exercising independently;Short Term: Able to use daily as guideline for intensity in rehab      Understanding of Exercise Prescription Yes Yes      Intervention Provide education, explanation, and written materials on patient's individual exercise prescription Provide education, explanation, and written materials on patient's individual exercise prescription      Expected Outcomes Short Term: Able to explain program exercise  prescription;Long Term: Able to explain home exercise prescription to exercise independently Short Term: Able to explain program exercise prescription;Long Term: Able to explain home exercise prescription to exercise independently             Exercise Goals Re-Evaluation :  Exercise Goals Re-Evaluation    [place] Name 09/17/19 1454             Exercise Goal Re-Evaluation   Exercise Goals Review Increase Physical Activity;Increase Strength and Stamina;Able to understand and use rate of perceived exertion (RPE) scale;Able to understand and use Dyspnea scale;Knowledge and understanding of Target Heart Rate Range (THRR);Understanding of Exercise Prescription       Comments pt has completed 4 exercise sessions with pulmonary rehab and has been tolerating exercise well. He is positive about exercising and incorporates walking most days of the week outside of rehab. He is currently working at 3 METs while walking the track and 2.3 METs on the Recumbent bike. We will continue to monitor and progress as he is able.       Expected Outcomes Through exercise at pulmonary rehab and home the patient will decrease shortness of breath with daily activities and feel confident in carrying out an exercise prescription at home               Discharge Exercise Prescription (Final Exercise Prescription Changes):  Exercise Prescription Changes - 09/17/19 1400      Response to Exercise   Blood Pressure (Admit) 138/76    Blood Pressure (Exercise) 146/80    Blood Pressure (Exit) 150/85    Heart Rate (Admit) 84 bpm    Heart Rate (Exercise) 105 bpm    Heart Rate (Exit) 98 bpm    Oxygen Saturation (Admit) 98 %    Oxygen Saturation (Exercise) 92 %    Oxygen Saturation (Exit) 96 %    Rating of Perceived Exertion (Exercise) 13    Perceived Dyspnea (Exercise) 2    Duration Continue with 30 min of aerobic exercise without signs/symptoms of physical distress.    Intensity THRR unchanged      Progression   Progression Continue to progress workloads to maintain intensity without signs/symptoms of physical distress.    Average METs 2.3      Resistance Training   Training Prescription No   On weight restrictions     Recumbant Bike   Level 2    Watts 20    Minutes 15    METs 2.3      Track   Laps 18    Minutes 15    METs 3.09      Home Exercise Plan   Plans to continue exercise at Home (comment)    Frequency Add 3 additional days to program exercise sessions.    Initial Home Exercises Provided 09/17/19           Nutrition:  Target Goals: Understanding of nutrition guidelines, daily intake of sodium <1547m, cholesterol <2041m calories 30% from fat and 7% or less from saturated fats, daily to have 5 or more servings of fruits and vegetables.  Biometrics:  Pre Biometrics - 08/28/19 1100      Pre Biometrics   Height 5' 9"  (1.753 m)    Weight 82.5 kg    BMI (Calculated) 26.85    Grip Strength 24 kg            Nutrition Therapy Plan and Nutrition Goals:  Nutrition Therapy & Goals - 09/10/19 1437      Nutrition Therapy  Diet High cal/high protein      Personal Nutrition Goals   Nutrition Goal Pt to identify food quantities necessary to achieve weight gain 0.25-1 lb per week during time in pulmonary  rehab    Personal Goal #2 Pt to incorporate minimum of 1 protein shake daily and 2 servings nuts/trail mix daily      Intervention Plan   Intervention Prescribe, educate and counsel regarding individualized specific dietary modifications aiming towards targeted core components such as weight, hypertension, lipid management, diabetes, heart failure and other comorbidities.;Nutrition handout(s) given to patient.    Expected Outcomes Short Term Goal: A plan has been developed with personal nutrition goals set during dietitian appointment.           Nutrition Assessments:   Nutrition Goals Re-Evaluation:  Nutrition Goals Re-Evaluation    [place] Name 09/10/19 1438             Goals   Current Weight 181 lb (82.1 kg)       Nutrition Goal Pt to identify food quantities necessary to achieve weight gain 0.25-1 lb per week during time in pulmonary rehab         Personal Goal #2 Re-Evaluation   Personal Goal #2 Pt to incorporate minimum of 1 protein shake daily and 2 servings nuts/trail mix daily              Nutrition Goals Discharge (Final Nutrition Goals Re-Evaluation):  Nutrition Goals Re-Evaluation - 09/10/19 1438      Goals   Current Weight 181 lb (82.1 kg)    Nutrition Goal Pt to identify food quantities necessary to achieve weight gain 0.25-1 lb per week during time in pulmonary rehab      Personal Goal #2 Re-Evaluation   Personal Goal #2 Pt to incorporate minimum of 1 protein shake daily and 2 servings nuts/trail mix daily           Psychosocial: Target Goals: Acknowledge presence or absence of significant depression and/or stress, maximize coping skills, provide positive support system. Participant is able to verbalize types and ability to use techniques and skills needed for reducing stress and depression.  Initial Review & Psychosocial Screening:  Initial Psych Review & Screening - 08/28/19 1143      Initial Review   Current issues with None Identified      Family  Dynamics   Good Support System? Yes      Barriers   Psychosocial barriers to participate in program There are no identifiable barriers or psychosocial needs.      Screening Interventions   Interventions Encouraged to exercise           Quality of Life Scores:  Scores of 19 and below usually indicate a poorer quality of life in these areas.  A difference of  2-3 points is a clinically meaningful difference.  A difference of 2-3 points in the total score of the Quality of Life Index has been associated with significant improvement in overall quality of life, self-image, physical symptoms, and general health in studies assessing change in quality of life.  PHQ-9: Recent Review Flowsheet Data    Depression screen [facility] 2/9 08/28/2019 10/18/2018 10/18/2018 01/26/2018 04/05/2017   Decreased Interest 0 - 0 0 2   Down, Depressed, Hopeless 0 0 0 0 0   PHQ - 2 Score 0 0 0 0 2   Altered sleeping 0 0 - 1 0   Tired, decreased energy 0 0 - 1 1   Change in  appetite 0 0 - 0 1   Feeling bad or failure about yourself  0 0 - 0 0   Trouble concentrating 0 0 - 0 0   Moving slowly or fidgety/restless 0 0 - 0 0   Suicidal thoughts 0 0 - 0 0   PHQ-9 Score 0 0 - 2 4   Difficult doing work/chores - Not difficult at all - Not difficult at all Not difficult at all     Interpretation of Total Score  Total Score Depression Severity:  1-4 = Minimal depression, 5-9 = Mild depression, 10-14 = Moderate depression, 15-19 = Moderately severe depression, 20-27 = Severe depression   Psychosocial Evaluation and Intervention:  Psychosocial Evaluation - 08/28/19 1143      Psychosocial Evaluation & Interventions   Interventions Encouraged to exercise with the program and follow exercise prescription    Comments No barriers or concerns were identified at this time.    Expected Outcomes For Jermery to continue to be free of barriers or psychosocial concerns while participating in pulmonary rehab.    Continue Psychosocial  Services  No Follow up required           Psychosocial Re-Evaluation:  Psychosocial Re-Evaluation    [place] Name 08/28/19 1144 09/18/19 1456 09/18/19 1502         Psychosocial Re-Evaluation   Current issues with None Identified None Identified --     Comments -- Pt with supportive wife.  Pt is pleased with the progress he has made and how well he is doing both physically and mentally Pt with supportive wife.  Pt is pleased with the progress he has made and how well he is doing both physically and mentally.  Pt reports some sleep concerns but this is associated with back and incisional discomfort.     Expected Outcomes -- That Amaziah continues to have no barriers or psychosocial concerns while in pulmonary rehab with countinued positve healthy coping skills and outlook. --     Interventions -- Encouraged to attend Pulmonary Rehabilitation for the exercise --     Continue Psychosocial Services  -- Follow up required by staff  Will continue to monitor and reasses pt mental well being. --            Psychosocial Discharge (Final Psychosocial Re-Evaluation):  Psychosocial Re-Evaluation - 09/18/19 1502      Psychosocial Re-Evaluation   Comments Pt with supportive wife.  Pt is pleased with the progress he has made and how well he is doing both physically and mentally.  Pt reports some sleep concerns but this is associated with back and incisional discomfort.           Education: Education Goals: Education classes will be provided on a weekly basis, covering required topics. Participant will state understanding/return demonstration of topics presented.  Learning Barriers/Preferences:  Learning Barriers/Preferences - 08/28/19 1145      Learning Barriers/Preferences   Learning Barriers None    Learning Preferences Audio;Group Instruction;Individual Instruction;Pictoral;Skilled Demonstration;Verbal Instruction;Video;Written Material           Education Topics: Risk Factor Reduction:   -Group instruction that is supported by a PowerPoint presentation. Instructor discusses the definition of a risk factor, different risk factors for pulmonary disease, and how the heart and lungs work together.     PULMONARY REHAB OTHER RESPIRATORY from 01/29/2019 in [place]  Date 01/08/19  Educator --  [Handout]      Nutrition for Pulmonary Patient:  -Group instruction  provided by PowerPoint slides, verbal discussion, and written materials to support subject matter. The instructor gives an explanation and review of healthy diet recommendations, which includes a discussion on weight management, recommendations for fruit and vegetable consumption, as well as protein, fluid, caffeine, fiber, sodium, sugar, and alcohol. Tips for eating when patients are short of breath are discussed.   Pursed Lip Breathing:  -Group instruction that is supported by demonstration and informational handouts. Instructor discusses the benefits of pursed lip and diaphragmatic breathing and detailed demonstration on how to preform both.     Oxygen Safety:  -Group instruction provided by PowerPoint, verbal discussion, and written material to support subject matter. There is an overview of "What is Oxygen" and "Why do we need it".  Instructor also reviews how to create a safe environment for oxygen use, the importance of using oxygen as prescribed, and the risks of noncompliance. There is a brief discussion on traveling with oxygen and resources the patient may utilize.   Oxygen Equipment:  -Group instruction provided by [facility] Staff utilizing handouts, written materials, and equipment demonstrations.   Signs and Symptoms:  -Group instruction provided by written material and verbal discussion to support subject matter. Warning signs and symptoms of infection, stroke, and heart attack are reviewed and when to call the physician/911 reinforced. Tips for preventing the spread of  infection discussed.   Advanced Directives:  -Group instruction provided by verbal instruction and written material to support subject matter. Instructor reviews Advanced Directive laws and proper instruction for filling out document.   Pulmonary Video:  -Group video education that reviews the importance of medication and oxygen compliance, exercise, good nutrition, pulmonary hygiene, and pursed lip and diaphragmatic breathing for the pulmonary patient.   Exercise for the Pulmonary Patient:  -Group instruction that is supported by a PowerPoint presentation. Instructor discusses benefits of exercise, core components of exercise, frequency, duration, and intensity of an exercise routine, importance of utilizing pulse oximetry during exercise, safety while exercising, and options of places to exercise outside of rehab.     Pulmonary Medications:  -Verbally interactive group education provided by instructor with focus on inhaled medications and proper administration.   PULMONARY REHAB OTHER RESPIRATORY from 01/29/2019 in [place]  Date 01/24/19  Educator --  [Handout]      Anatomy and Physiology of the Respiratory System and Intimacy:  -Group instruction provided by PowerPoint, verbal discussion, and written material to support subject matter. Instructor reviews respiratory cycle and anatomical components of the respiratory system and their functions. Instructor also reviews differences in obstructive and restrictive respiratory diseases with examples of each. Intimacy, Sex, and Sexuality differences are reviewed with a discussion on how relationships can change when diagnosed with pulmonary disease. Common sexual concerns are reviewed.   MD DAY -A group question and answer session with a medical doctor that allows participants to ask questions that relate to their pulmonary disease state.   OTHER EDUCATION -Group or individual verbal, written, or video  instructions that support the educational goals of the pulmonary rehab program.   PULMONARY REHAB OTHER RESPIRATORY from 09/12/2019 in [place]  Date 10/01/19  Educator Handout  Marijo Conception Easy]      Holiday Eating Survival Tips:  -Group instruction provided by PowerPoint slides, verbal discussion, and written materials to support subject matter. The instructor gives patients tips, tricks, and techniques to help them not only survive but enjoy the holidays despite the onslaught of food that accompanies  the holidays.   PULMONARY REHAB OTHER RESPIRATORY from 01/29/2019 in [place]  Date 12/25/18  Educator 01/08/19  [Handout]      Knowledge Questionnaire Score:  Knowledge Questionnaire Score - 09/05/19 1503      Knowledge Questionnaire Score   Pre Score 17/18           Core Components/Risk Factors/Patient Goals at Admission:  Personal Goals and Risk Factors at Admission - 08/28/19 1145      Core Components/Risk Factors/Patient Goals on Admission   Improve shortness of breath with ADL's Yes    Intervention Provide education, individualized exercise plan and daily activity instruction to help decrease symptoms of SOB with activities of daily living.    Expected Outcomes Short Term: Improve cardiorespiratory fitness to achieve a reduction of symptoms when performing ADLs;Long Term: Be able to perform more ADLs without symptoms or delay the onset of symptoms           Core Components/Risk Factors/Patient Goals Review:   Goals and Risk Factor Review    Row Name 08/28/19 1145 09/18/19 1459           Core Components/Risk Factors/Patient Goals Review   Personal Goals Review Increase knowledge of respiratory medications and ability to use respiratory devices properly.;Improve shortness of breath with ADL's;Develop more efficient breathing techniques such as purse lipped breathing and diaphragmatic breathing and  practicing self-pacing with activity. Increase knowledge of respiratory medications and ability to use respiratory devices properly.;Improve shortness of breath with ADL's;Develop more efficient breathing techniques such as purse lipped breathing and diaphragmatic breathing and practicing self-pacing with activity.      Review -- Macalister is off to a wonderful start with the completion of 4 exercise sessions.  Pt has pulse ox and understands how to use it as well as acceptable parameters.  Pt continues to use the incentive spirometer he was given post operatively.  Pt able to demonstrate efficient breathing techniques such as PLB.  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             Core Components/Risk Factors/Patient Goals at Discharge (Final Review):   Goals and Risk Factor Review - 09/18/19 1459      Core Components/Risk Factors/Patient Goals Review   Personal Goals Review Increase knowledge of respiratory medications and ability to use respiratory devices properly.;Improve shortness of breath with ADL's;Develop more efficient breathing techniques such as purse lipped breathing and diaphragmatic breathing and practicing self-pacing with activity.    Review Zarin is off to a wonderful start with the completion of 4 exercise sessions.  Pt has pulse ox and understands how to use it as well as acceptable parameters.  Pt continues to use the incentive spirometer he was given post operatively.  Pt able to demonstrate efficient breathing techniques such as PLB.  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           ITP Comments:  ITP Comments    Row Name 09/18/19 1453           ITP Comments Dr. Rodman Pickle, Medical Director [facility]              Comments:  Tyquan has completed 4 exercise session in Pulmonary rehab. Pt maintains good attendance and consistent home exercise. Pulmonary rehab staff will   continue to monitor and reassess progress toward goals during her participation in Pulmonary Rehab. Maurice Small RN, BSN Cardiac and  Pulmonary Rehab Nurse Navigator

## 2019-09-19 ENCOUNTER — Encounter (HOSPITAL_COMMUNITY)
Admission: RE | Admit: 2019-09-19 | Discharge: 2019-09-19 | Disposition: A | Discharge status: Home / Self Care | Payer: 59 | Source: Ambulatory Visit | Attending: Cardiology | Admitting: Cardiology

## 2019-09-19 ENCOUNTER — Other Ambulatory Visit: Payer: Self-pay

## 2019-09-19 DIAGNOSIS — Z8719 Personal history of other diseases of the digestive system: Secondary | ICD-10-CM | POA: Diagnosis not present

## 2019-09-19 DIAGNOSIS — K7681 Hepatopulmonary syndrome: Secondary | ICD-10-CM | POA: Diagnosis not present

## 2019-09-19 NOTE — Progress Notes (Signed)
Daily Session Note  Patient Details  Name: William [name] MRN: [id number] Date of Birth: 11/30/1950 Referring Provider:     Pulmonary Rehab Walk Test from 08/28/2019 in [place]  Referring Provider [name], [name], MD      Encounter Date: 09/19/2019  Check In:  Session Check In - 09/19/19 1320      Check-In   Supervising physician immediately available to respond to emergencies [facility] immediately available    Physician(s) Dr. Alfredia Ferguson    Location [facility]    Staff Present Hoy Register, MS, CEP, Exercise Physiologist;Lisa Jani Gravel, MS, ACSM-CEP, Exercise Physiologist    Virtual Visit No    Medication changes reported     No    Fall or balance concerns reported    No    Tobacco Cessation No Change    Warm-up and Cool-down Performed on first and last piece of equipment    Resistance Training Performed Yes    VAD Patient? No    PAD/SET Patient? No      Pain Assessment   Currently in Pain? No/denies    Pain Score 0-No pain    Multiple Pain Sites No           Capillary Blood Glucose: No results found for this or any previous visit (from the past 24 hour(s)).    Social History   Tobacco Use  Smoking Status Former Smoker  . Types: Cigarettes  Smokeless Tobacco Never Used  Tobacco Comment   QUIT I 1983    Goals Met:  Proper associated with RPD/PD & O2 Sat Exercise tolerated well Strength training completed today  Goals Unmet:  Not Applicable  Comments: Service time is from 1315 to 1418    Dr. Fransico Him is Medical Director for Cardiac Rehab at [facility].

## 2019-09-20 DIAGNOSIS — D849 Immunodeficiency, unspecified: Secondary | ICD-10-CM | POA: Diagnosis not present

## 2019-09-20 DIAGNOSIS — Z944 Liver transplant status: Secondary | ICD-10-CM | POA: Diagnosis not present

## 2019-09-24 ENCOUNTER — Encounter (HOSPITAL_COMMUNITY): Payer: 59

## 2019-09-24 DIAGNOSIS — Z944 Liver transplant status: Secondary | ICD-10-CM | POA: Diagnosis not present

## 2019-09-24 DIAGNOSIS — D849 Immunodeficiency, unspecified: Secondary | ICD-10-CM | POA: Diagnosis not present

## 2019-09-26 ENCOUNTER — Encounter (HOSPITAL_COMMUNITY)
Admission: RE | Admit: 2019-09-26 | Discharge: 2019-09-26 | Disposition: A | Discharge status: Home / Self Care | Payer: 59 | Source: Ambulatory Visit | Attending: Cardiology | Admitting: Cardiology

## 2019-09-26 ENCOUNTER — Other Ambulatory Visit: Payer: Self-pay

## 2019-09-26 DIAGNOSIS — Z8719 Personal history of other diseases of the digestive system: Secondary | ICD-10-CM | POA: Diagnosis not present

## 2019-09-26 DIAGNOSIS — K7681 Hepatopulmonary syndrome: Secondary | ICD-10-CM | POA: Diagnosis not present

## 2019-09-26 NOTE — Progress Notes (Signed)
Daily Session Note  Patient Details  Name: William [name] MRN: [id number] Date of Birth: 01-11-1951 Referring Provider:     Pulmonary Rehab Walk Test from 08/28/2019 in [place]  Referring Provider [name], [name], MD      Encounter Date: 09/26/2019  Check In:  Session Check In - 09/26/19 1342      Check-In   Supervising physician immediately available to respond to emergencies [facility] immediately available    Physician(s) Dr. Charlett Blake    [facility]    Staff Present Hoy Register, MS, CEP, Exercise Physiologist;Lisa [name], RN;David [place], MS, EP-C, CCRP;Danea Manter Hassell Done, MS, ACSM-CEP, Exercise Physiologist    Virtual Visit No    Medication changes reported     No    Fall or balance concerns reported    No    Tobacco Cessation No Change    Warm-up and Cool-down Performed on first and last piece of equipment    Resistance Training Performed Yes    VAD Patient? No    PAD/SET Patient? No      Pain Assessment   Currently in Pain? No/denies    Multiple Pain Sites No           Capillary Blood Glucose: No results found for this or any previous visit (from the past 24 hour(s)).    Social History   Tobacco Use  Smoking Status Former Smoker  . Types: Cigarettes  Smokeless Tobacco Never Used  Tobacco Comment   QUIT I 1983    Goals Met:  Proper associated with RPD/PD & O2 Sat [place] with exercise equipment Exercise tolerated well Strength training completed today  Goals Unmet:  Not Applicable  Comments: Service time is from 1300 to 1358    Dr. Fransico Him is Medical Director for Cardiac Rehab at [facility].

## 2019-09-29 DIAGNOSIS — J181 Lobar pneumonia, unspecified organism: Secondary | ICD-10-CM | POA: Diagnosis not present

## 2019-09-30 MED FILL — SULFAMETHOXAZOLE-TMP DS TAB: 800-160 | 30 days supply | Qty: 13 | Fill #1

## 2019-10-01 ENCOUNTER — Other Ambulatory Visit: Payer: Self-pay

## 2019-10-01 ENCOUNTER — Encounter (HOSPITAL_COMMUNITY)
Admission: RE | Admit: 2019-10-01 | Discharge: 2019-10-01 | Disposition: A | Discharge status: Home / Self Care | Payer: 59 | Source: Ambulatory Visit | Attending: Cardiology | Admitting: Cardiology

## 2019-10-01 ENCOUNTER — Ambulatory Visit: Payer: 59

## 2019-10-01 VITALS — Wt 184.5 lb

## 2019-10-01 DIAGNOSIS — Z8719 Personal history of other diseases of the digestive system: Secondary | ICD-10-CM | POA: Diagnosis not present

## 2019-10-01 DIAGNOSIS — K7681 Hepatopulmonary syndrome: Secondary | ICD-10-CM | POA: Diagnosis not present

## 2019-10-01 DIAGNOSIS — Z944 Liver transplant status: Secondary | ICD-10-CM | POA: Diagnosis not present

## 2019-10-01 DIAGNOSIS — D849 Immunodeficiency, unspecified: Secondary | ICD-10-CM | POA: Diagnosis not present

## 2019-10-01 MED FILL — ACYCLOVIR 400 MG TABLET: 400 | 30 days supply | Qty: 60 | Fill #1

## 2019-10-01 NOTE — Progress Notes (Signed)
Daily Session Note  Patient Details  Name: William [name] MRN: [id number] Date of Birth: 1950-09-04 Referring Provider:     Pulmonary Rehab Walk Test from 08/28/2019 in [place]  Referring Provider [name], [name], MD      Encounter Date: 10/01/2019  Check In:  Session Check In - 10/01/19 1446      Check-In   Supervising physician immediately available to respond to emergencies [facility] immediately available    Physician(s) Dr. Philis Pique    [facility]    Staff Present Maurice Small, RN, Bjorn Loser, MS, CEP, Exercise Physiologist;Lisa Jani Gravel, MS, ACSM-CEP, Exercise Physiologist    Virtual Visit No    Medication changes reported     Yes    Comments Prednisone dose decreased to 109m in order to receive booster shot for COVID-19    Fall or balance concerns reported    No    Tobacco Cessation No Change    Warm-up and Cool-down Performed on first and last piece of equipment    Resistance Training Performed Yes    VAD Patient? No    PAD/SET Patient? No      Pain Assessment   Currently in Pain? No/denies    Pain Score 0-No pain    Multiple Pain Sites No           Capillary Blood Glucose: No results found for this or any previous visit (from the past 24 hour(s)).   Exercise Prescription Changes - 10/01/19 1500      Response to Exercise   Blood Pressure (Admit) 140/78    Blood Pressure (Exercise) 146/70    Blood Pressure (Exit) 142/78    Heart Rate (Admit) 85 bpm    Heart Rate (Exercise) 117 bpm    Heart Rate (Exit) 97 bpm    Oxygen Saturation (Admit) 96 %    Oxygen Saturation (Exercise) 93 %    Oxygen Saturation (Exit) 97 %    Rating of Perceived Exertion (Exercise) 12    Perceived Dyspnea (Exercise) 2    Duration Continue with 30 min of aerobic exercise without signs/symptoms of physical distress.    Intensity THRR unchanged      Progression   Progression Continue to  progress workloads to maintain intensity without signs/symptoms of physical distress.    Average METs 2.8      Resistance Training   Training Prescription Yes    Weight 3lbs    Reps 10-15    Time 10 Minutes      Recumbant Bike   Level 3    Watts 20    Minutes 15    METs 2.8      Track   Laps 15    Minutes 15           Social History   Tobacco Use  Smoking Status Former Smoker  . Types: Cigarettes  Smokeless Tobacco Never Used  Tobacco Comment   QUIT I 1983    Goals Met:  Proper associated with RPD/PD & O2 Sat [place] with exercise equipment Exercise tolerated well No report of cardiac concerns or symptoms Strength training completed today  Goals Unmet:  Not Applicable  Comments: Service time is from 1300 to 1355    Dr. TFransico Himis Medical Director [facility]

## 2019-10-03 ENCOUNTER — Encounter (HOSPITAL_COMMUNITY)
Admission: RE | Admit: 2019-10-03 | Discharge: 2019-10-03 | Disposition: A | Discharge status: Home / Self Care | Payer: 59 | Source: Ambulatory Visit | Attending: Cardiology | Admitting: Cardiology

## 2019-10-03 ENCOUNTER — Other Ambulatory Visit: Payer: Self-pay

## 2019-10-03 DIAGNOSIS — Z8719 Personal history of other diseases of the digestive system: Secondary | ICD-10-CM | POA: Diagnosis not present

## 2019-10-03 DIAGNOSIS — K7681 Hepatopulmonary syndrome: Secondary | ICD-10-CM | POA: Diagnosis not present

## 2019-10-03 NOTE — Progress Notes (Signed)
Daily Session Note  Patient Details  Name: William [name] MRN: [id number] Date of Birth: Dec 10, 1950 Referring Provider:     Pulmonary Rehab Walk Test from 08/28/2019 in [place]  Referring Provider [name], [name], MD      Encounter Date: 10/03/2019  Check In:  Session Check In - 10/03/19 1441      Check-In   Supervising physician immediately available to respond to emergencies [facility] immediately available    Physician(s) Dr. Gala Lewandowsky    [facility]    Staff Present Maurice Small, RN, Bjorn Loser, MS, CEP, Exercise Physiologist;Lisa Jani Gravel, MS, ACSM-CEP, Exercise Physiologist    Virtual Visit No    Medication changes reported     No    Fall or balance concerns reported    No    Tobacco Cessation No Change    Warm-up and Cool-down Performed on first and last piece of equipment    Resistance Training Performed Yes    VAD Patient? No    PAD/SET Patient? No      Pain Assessment   Currently in Pain? No/denies    Pain Score 0-No pain    Multiple Pain Sites No           Capillary Blood Glucose: No results found for this or any previous visit (from the past 24 hour(s)).    Social History   Tobacco Use  Smoking Status Former Smoker  . Types: Cigarettes  Smokeless Tobacco Never Used  Tobacco Comment   QUIT I 1983    Goals Met:  Proper associated with RPD/PD & O2 Sat [place] with exercise equipment Exercise tolerated well No report of cardiac concerns or symptoms Strength training completed today  Goals Unmet:  Not Applicable  Comments: Service time is from 1300 to 1355    Dr. Fransico Him is Medical Director for Cardiac Rehab at [facility].

## 2019-10-05 MED FILL — CLOTRIMAZOLE 10 MG TROCHE: 10 | 30 days supply | Qty: 60 | Fill #1

## 2019-10-05 MED FILL — MYCOPHENOLATE 250 MG CAP: 250 | 30 days supply | Qty: 240 | Fill #1

## 2019-10-05 MED FILL — METOPROLOL TARTRATE 25 MG T: 25 | 30 days supply | Qty: 30 | Fill #1

## 2019-10-07 DIAGNOSIS — D849 Immunodeficiency, unspecified: Secondary | ICD-10-CM | POA: Diagnosis not present

## 2019-10-07 DIAGNOSIS — Z944 Liver transplant status: Secondary | ICD-10-CM | POA: Diagnosis not present

## 2019-10-08 ENCOUNTER — Encounter (HOSPITAL_COMMUNITY)
Admission: RE | Admit: 2019-10-08 | Discharge: 2019-10-08 | Disposition: A | Discharge status: Home / Self Care | Payer: 59 | Source: Ambulatory Visit | Attending: Cardiology | Admitting: Cardiology

## 2019-10-08 ENCOUNTER — Other Ambulatory Visit: Payer: Self-pay

## 2019-10-08 DIAGNOSIS — K7681 Hepatopulmonary syndrome: Secondary | ICD-10-CM

## 2019-10-08 DIAGNOSIS — Z8719 Personal history of other diseases of the digestive system: Secondary | ICD-10-CM

## 2019-10-08 DIAGNOSIS — J8410 Pulmonary fibrosis, unspecified: Secondary | ICD-10-CM

## 2019-10-08 NOTE — Progress Notes (Signed)
Daily Session Note  Patient Details  Name: William [name] MRN: [id number] Date of Birth: [birth year] Referring Provider:     Pulmonary Rehab Walk Test from 08/28/2019 in [place]  Referring Provider [name], [name], MD      Encounter Date: 10/08/2019  Check In:   Capillary Blood Glucose: No results found for this or any previous visit (from the past 24 hour(s)).    Social History   Tobacco Use  Smoking Status Former Smoker  . Types: Cigarettes  Smokeless Tobacco Never Used  Tobacco Comment   QUIT I 1983    Goals Met:  Exercise tolerated well No report of cardiac concerns or symptoms Strength training completed today  Goals Unmet:  Not Applicable  Comments: Service time is from 1300 to 1400    Dr. Fransico Him is Medical Director for Cardiac Rehab at [facility].

## 2019-10-09 ENCOUNTER — Ambulatory Visit: Payer: 59

## 2019-10-10 ENCOUNTER — Encounter (HOSPITAL_COMMUNITY)
Admission: RE | Admit: 2019-10-10 | Discharge: 2019-10-10 | Disposition: A | Discharge status: Home / Self Care | Payer: 59 | Source: Ambulatory Visit | Attending: Cardiology | Admitting: Cardiology

## 2019-10-10 ENCOUNTER — Other Ambulatory Visit: Payer: Self-pay

## 2019-10-10 DIAGNOSIS — K7681 Hepatopulmonary syndrome: Secondary | ICD-10-CM | POA: Insufficient documentation

## 2019-10-10 DIAGNOSIS — Z8719 Personal history of other diseases of the digestive system: Secondary | ICD-10-CM | POA: Diagnosis not present

## 2019-10-10 DIAGNOSIS — J8410 Pulmonary fibrosis, unspecified: Secondary | ICD-10-CM

## 2019-10-10 NOTE — Progress Notes (Signed)
Daily Session Note  Patient Details  Name: William [name] MRN: [id number] Date of Birth: 10-12-1950 Referring Provider:     Pulmonary Rehab Walk Test from 08/28/2019 in [place]  Referring Provider [name], [name], MD      Encounter Date: 10/10/2019  Check In:  Session Check In - 10/10/19 1522      Check-In   Supervising physician immediately available to respond to emergencies [facility] immediately available    Physician(s) Dr. Cathlean Sauer    Location [facility]    Staff Present Hoy Register, MS, CEP, Exercise Physiologist;Crimson Dubberly [name], RN;David Makemson, MS, EP-C, CCRP    Virtual Visit No    Medication changes reported     No    Tobacco Cessation No Change    Warm-up and Cool-down Performed on first and last piece of equipment    Resistance Training Performed Yes    VAD Patient? No    PAD/SET Patient? No      Pain Assessment   Currently in Pain? No/denies    Multiple Pain Sites No           Capillary Blood Glucose: No results found for this or any previous visit (from the past 24 hour(s)).    Social History   Tobacco Use  Smoking Status Former Smoker  . Types: Cigarettes  Smokeless Tobacco Never Used  Tobacco Comment   QUIT I 1983    Goals Met:  Exercise tolerated well No report of cardiac concerns or symptoms Strength training completed today  Goals Unmet:  Not Applicable  Comments: Service time is from 1300 to 1355    Dr. Fransico Him is Medical Director for Cardiac Rehab at [facility].

## 2019-10-15 ENCOUNTER — Other Ambulatory Visit: Payer: Self-pay

## 2019-10-15 ENCOUNTER — Encounter (HOSPITAL_COMMUNITY)
Admission: RE | Admit: 2019-10-15 | Discharge: 2019-10-15 | Disposition: A | Discharge status: Home / Self Care | Payer: 59 | Source: Ambulatory Visit | Attending: Cardiology | Admitting: Cardiology

## 2019-10-15 VITALS — Wt 183.9 lb

## 2019-10-15 DIAGNOSIS — Z8719 Personal history of other diseases of the digestive system: Secondary | ICD-10-CM | POA: Diagnosis not present

## 2019-10-15 DIAGNOSIS — K7681 Hepatopulmonary syndrome: Secondary | ICD-10-CM

## 2019-10-15 NOTE — Progress Notes (Signed)
Daily Session Note  Patient Details  Name: ABRHAM MASLOWSKI MRN: [id number] Date of Birth: [birth year] Referring Provider:     Pulmonary Rehab Walk Test from 08/28/2019 in [place]  Referring Provider [name], [name], MD      Encounter Date: 10/15/2019  Check In:  Session Check In - 10/15/19 1506      Check-In   Supervising physician immediately available to respond to emergencies [facility] immediately available    Physician(s) Dr. Cathlean Sauer    Location [facility]    Staff Present Maurice Small, RN, BSN;Lisa [name], RN;David [place], MS, EP-C, CCRP;Matika Bartell Hassell Done, MS, ACSM-CEP, Exercise Physiologist    Virtual Visit No    Medication changes reported     No    Fall or balance concerns reported    No    Tobacco Cessation No Change    Warm-up and Cool-down Performed on first and last piece of equipment    Resistance Training Performed Yes    VAD Patient? No    PAD/SET Patient? No      Pain Assessment   Currently in Pain? No/denies    Pain Score 0-No pain    Multiple Pain Sites No           Capillary Blood Glucose: No results found for this or any previous visit (from the past 24 hour(s)).   Exercise Prescription Changes - 10/15/19 1500      Response to Exercise   Blood Pressure (Admit) 142/74    Blood Pressure (Exercise) 162/80    Blood Pressure (Exit) 134/78    Heart Rate (Admit) 77 bpm    Heart Rate (Exercise) 112 bpm    Heart Rate (Exit) 92 bpm    Oxygen Saturation (Admit) 97 %    Oxygen Saturation (Exercise) 94 %    Oxygen Saturation (Exit) 97 %    Rating of Perceived Exertion (Exercise) 13    Perceived Dyspnea (Exercise) 2    Duration Continue with 30 min of aerobic exercise without signs/symptoms of physical distress.    Intensity THRR unchanged      Progression   Progression Continue to progress workloads to maintain intensity without signs/symptoms of physical distress.    Average METs 3       Resistance Training   Training Prescription Yes    Weight 3lbs    Reps 10-15    Time 10 Minutes      Recumbant Bike   Level 3    Watts 20    Minutes 15    METs 2.8      Track   Laps 15    Minutes 15    METs 2.74           Social History   Tobacco Use  Smoking Status Former Smoker  . Types: Cigarettes  Smokeless Tobacco Never Used  Tobacco Comment   QUIT I 1983    Goals Met:  Proper associated with RPD/PD & O2 Sat Independence with exercise equipment Exercise tolerated well No report of cardiac concerns or symptoms Strength training completed today  Goals Unmet:  Not Applicable  Comments: Service time is from 1300 to 1400    Dr. Fransico Him is Medical Director for Cardiac Rehab at [facility].

## 2019-10-16 ENCOUNTER — Encounter (HOSPITAL_COMMUNITY): Payer: Self-pay

## 2019-10-17 ENCOUNTER — Ambulatory Visit: Payer: 59

## 2019-10-17 ENCOUNTER — Ambulatory Visit: Payer: 59 | Admitting: Hematology

## 2019-10-17 ENCOUNTER — Other Ambulatory Visit: Payer: Self-pay

## 2019-10-17 ENCOUNTER — Other Ambulatory Visit: Payer: 59

## 2019-10-17 ENCOUNTER — Encounter (HOSPITAL_COMMUNITY)
Admission: RE | Admit: 2019-10-17 | Discharge: 2019-10-17 | Disposition: A | Discharge status: Home / Self Care | Payer: 59 | Source: Ambulatory Visit | Attending: Cardiology | Admitting: Cardiology

## 2019-10-17 DIAGNOSIS — K7681 Hepatopulmonary syndrome: Secondary | ICD-10-CM

## 2019-10-17 DIAGNOSIS — Z8719 Personal history of other diseases of the digestive system: Secondary | ICD-10-CM | POA: Diagnosis not present

## 2019-10-17 NOTE — Progress Notes (Signed)
Daily Session Note  Patient Details  Name: William [name] MRN: [id number] Date of Birth: 06-16-1950 Referring Provider:     Pulmonary Rehab Walk Test from 08/28/2019 in [place]  Referring Provider [name], [name], MD      Encounter Date: 10/17/2019  Check In:  Session Check In - 10/17/19 1345      Check-In   Supervising physician immediately available to respond to emergencies [facility] immediately available    Physician(s) Dr. Cathlean Sauer    Location [facility]    Staff Present Rodney Langton, RN;Jessica Hassell Done, MS, ACSM-CEP, Exercise Physiologist;Jamai Dolce Rollene Rotunda, RN, BSN    Virtual Visit No    Medication changes reported     No    Fall or balance concerns reported    No    Tobacco Cessation No Change    Warm-up and Cool-down Performed on first and last piece of equipment    Resistance Training Performed Yes    VAD Patient? No    PAD/SET Patient? No      Pain Assessment   Currently in Pain? No/denies    Pain Score 0-No pain    Multiple Pain Sites No           Capillary Blood Glucose: No results found for this or any previous visit (from the past 24 hour(s)).    Social History   Tobacco Use  Smoking Status Former Smoker  . Types: Cigarettes  Smokeless Tobacco Never Used  Tobacco Comment   QUIT I 1983    Goals Met:  Proper associated with RPD/PD & O2 Sat Using PLB without cueing & demonstrates good technique Exercise tolerated well Strength training completed today  Goals Unmet:  Not Applicable  Comments: Service time is from 1305 to 1400   Dr. Fransico Him is Medical Director for Cardiac Rehab at [facility].

## 2019-10-17 NOTE — Progress Notes (Signed)
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  acyclovir (ZOVIRAX) 400 MG tablet, Take 400 mg by mouth 2 (two) times daily., Disp: , Rfl:  .  acyclovir (ZOVIRAX) 400 MG tablet, Take 400 mg by mouth in the morning and at bedtime., Disp: , Rfl:  .  aspirin EC 81 MG tablet, Take 81 mg by mouth daily. Swallow whole., Disp: , Rfl:  .  calcium citrate-vitamin D (CITRACAL+D) 315-200 MG-UNIT tablet, Take by mouth., Disp: , Rfl:  .  clotrimazole (MYCELEX) 10 MG troche, , Disp: , Rfl:  .  hydrocortisone 2.5 % cream, APPLY TWICE A DAY TO SCALING AREAS OF BROWS WHEN NEEDED. MAY APPLY TO SCALING INSIDE OF EARS AS WELL, Disp: , Rfl:  .  iron sucrose (VENOFER) 20 MG/ML injection, Inject 750 mg into the vein. Every 60 days (Patient not taking: Reported on 08/28/2019), Disp: , Rfl:  .  lactulose (CHRONULAC) 10 GM/15ML solution, Take 13.3333 g by mouth 2 (two) times a day. (Patient not taking: Reported on 08/14/2019), Disp: , Rfl:  .  metoprolol tartrate (LOPRESSOR) 25 MG tablet, TAKE 0.5 TABLETS (12.5 MG TOTAL) BY MOUTH 2 (TWO) TIMES DAILY., Disp: 90 tablet, Rfl: 1 .  mycophenolate (CELLCEPT) 250 MG capsule, Take 250 mg by mouth 2 (two) times daily. 4 capsules ( total 1000 mg ) every 12 hours, Disp: , Rfl:  .  OCTREOTIDE ACETATE IJ, Inject 30 mcg into the vein every 30 (thirty) days. , Disp: , Rfl:  .  oxyCODONE (OXY IR/ROXICODONE) 5 MG immediate release tablet, Take 5 mg by mouth 5 (five) times daily as needed., Disp: ,  Rfl:  .  pantoprazole (PROTONIX) 40 MG tablet, Take 1 tablet (40 mg total) by mouth 2 (two) times daily before a meal. TAKE 1 TABLET BY MOUTH TWICE DAILY BEFORE A MEAL, Disp: 180 tablet, Rfl: 1 .  potassium chloride SA (KLOR-CON) 20 MEQ tablet, Take 1 tablet (20 mEq total) by mouth 2 (two) times daily. TAKE 1 TABLET (20 MEQ TOTAL) BY MOUTH TWICE A DAY (Patient not taking: Reported on 08/14/2019), Disp: 180 tablet, Rfl: 1 .  predniSONE (DELTASONE) 10 MG tablet, Take 10 mg by mouth daily with breakfast., Disp: , Rfl:  .  spironolactone (ALDACTONE) 25 MG tablet, TAKE 0.5 TABLET (12.5 MG TOTAL) BY MOUTH DAILY., Disp: 45 tablet, Rfl: 1 .  spironolactone (ALDACTONE) 25 MG tablet, Take by mouth. (Patient not taking: Reported on 08/14/2019), Disp: , Rfl:  .  sulfamethoxazole-trimethoprim (BACTRIM DS) 800-160 MG tablet, Take 1 tablet by mouth 3 (three) times a week. Monday , Wednesday  And Friday, Disp: , Rfl:  .  tacrolimus (PROGRAF) 1 MG capsule, Take 1 mg by mouth 2 (two) times daily. Take 3 tablets in am and 2 in evening, Disp: , Rfl:  .  torsemide (DEMADEX) 20 MG tablet, TAKE 2 TABLETS (40 MG TOTAL) BY MOUTH DAILY. (Patient not taking: Reported on 08/14/2019), Disp: 180 tablet, Rfl: 1 .  tranexamic acid (LYSTEDA) 650 MG TABS tablet,  TAKE 1 TABLET BY MOUTH TWICE DAILY BEFORE A MEAL, Disp: 60 tablet, Rfl: 2 No current facility-administered medications for this encounter.  Facility-Administered Medications Ordered in Other Encounters:  .  heparin lock flush 100 unit/mL, 500 Units, Intracatheter, Daily PRN, Truitt Merle, MD .  sodium chloride flush (NS) 0.9 % injection 10 mL, 10 mL, Intracatheter, PRN, Truitt Merle, MD  Past Medical History: Past Medical History:  Diagnosis Date  . AAA (abdominal aortic aneurysm) ([place]) 03/2014   repaired 2020  . Bilateral renal cysts    simple (03/2014 MRI)  . CAD (coronary artery disease)   . Cholelithiases 2018   asymptomatic  . Chronic diastolic heart failure ([place])   .  Diabetes mellitus with complication ([place]) 44/9675   A1c 6.8%  . 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 History of blood transfusion 2018 X 4 dates   "low blood" (09/21/2016)  . History of liver transplant ([facility]) 07/2019   [facility]  . Hyperlipemia, mixed    elevated LFTs when on statins.    . Hypertension    Cr bump 04/01/16 so I changed benicar-hct to benicar plain and added amlodipine 5 mg.  . Iron deficiency anemia 05/2016   Acute blood loss anemia: hospitalized, required transfusion x 3 U: colonoscopy and capsule study unrevealing.  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  W/u for ? hemolytic anemia to be pursued by hematologist as outpt.  Dr. [name], GI at [facility] following, too---he rec'd onc do bone marrow bx as of Jan 2019  . Iron deficiency anemia due to chronic blood loss 2018/19   GI: transfusions x >20 required; multiple endoscopies and bleeding scans unrevealing. Lysteda and octreotide + monthly iron infusions as of 04/2017. Iron infusions changed to every other month as of 09/2018 hem f/u.  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  Hx of portal HTN changes with mild ascites and splenomegaly.  Esoph varices, elev bili, elev INR, low alb, hx of hep enceph->liver transplant 07/2019.  . Liver failure ([place]) 2020   NASH cirrhosis-->pt began April 2020 undergoing long eval for placement on liver transplant list at [facility].  . Lung field abnormal finding on examination    Bibasilar L>R mild insp crackles-->x-ray showed mild interstitial changes/fibrosis/scarring.  Changes noted on all CXRs in 2018/2019.  Liver Transplant eval 03/2018->mild restriction on PFTs but no obstruction.  See further details in [facility] "pulm fibrosis" section  . Microscopic hematuria    Eval unremarkable by Dr. Eulogio Ditch.  . 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 Obesity   . Pulmonary fibrosis ([facility])    PFTs: restrictive lung dz ([facility] transplant eval 05/2018). [facility] pulm eval felt this was likely hepatopulmonary syndrome->causing his hypoxia->low likelihood of progression (as of 10/04/18 transplant clinic f/u). Pulm rehab helping as of 2020/2021.  [name] Spleen enlarged     Tobacco Use: Social History   Tobacco Use  Smoking Status Former Smoker  . Types: Cigarettes  Smokeless Tobacco Never Used  Tobacco Comment   QUIT I 1983    Labs: Recent Review Flowsheet Data    Labs for ITP Cardiac and Pulmonary Rehab Latest Ref Rng & Units 01/04/2017 01/25/2017 04/05/2017 01/26/2018 05/15/2018   Cholestrol 100 - 199 mg/dL - 162 - - -   LDLCALC 0 - 99 mg/dL -  100(H) - - -   HDL >39 mg/dL - 47 - - -   Trlycerides 0 - 149 mg/dL - 76 - - -   Hemoglobin A1c - 5.5 - 4.6 4.7 4.7      Capillary Blood Glucose: Lab Results  Component Value Date   GLUCAP 154 (H) 05/11/2017   GLUCAP 109 (H) 12/01/2016   GLUCAP 127 (H) 12/01/2016   GLUCAP 122 (H) 09/23/2016   GLUCAP 162 (H) 09/22/2016     Pulmonary Assessment Scores:  Pulmonary Assessment Scores    Row Name 08/28/19 1113 09/05/19 1504       ADL UCSD   ADL Phase -- Entry    SOB Score total -- 46      CAT Score   CAT Score -- 18      mMRC Score   mMRC Score 2 --          UCSD: Self-administered rating of dyspnea associated with activities of daily living (ADLs) 6-point scale (0 = "not at all" to 5 = "maximal or unable to do because of breathlessness")  Scoring Scores range from 0 to 120.  Minimally important difference is 5 units  CAT: CAT can identify the health impairment of COPD patients and is better correlated with disease progression.  CAT has a scoring range of zero to 40. The CAT score is classified into four groups of low (less than 10), medium (10 - 20), high (21-30) and very high (31-40) based on the impact level of disease on health status. A CAT score over  10 suggests significant symptoms.  A worsening CAT score could be explained by an exacerbation, poor medication adherence, poor inhaler technique, or progression of COPD or comorbid conditions.  CAT MCID is 2 points  mMRC: mMRC (Modified Medical Research Council) Dyspnea Scale is used to assess the degree of baseline functional disability in patients of respiratory disease due to dyspnea. No minimal important difference is established. A decrease in score of 1 point or greater is considered a positive change.   Pulmonary Function Assessment:  Pulmonary Function Assessment - 08/28/19 1142      Breath   Bilateral Breath Sounds Clear    Shortness of Breath Yes;Limiting activity           Exercise Target Goals: Exercise Program Goal: Individual exercise prescription set using results from initial 6 min walk test and THRR while considering  patient's activity barriers and safety.   Exercise Prescription Goal: Initial exercise prescription builds to 30-45 minutes a day of aerobic activity, 2-3 days per week.  Home exercise guidelines will be given to patient during program as part of exercise prescription that the participant will acknowledge.  Activity Barriers & Risk Stratification:  Activity Barriers & Cardiac Risk Stratification - 08/28/19 1059      Activity Barriers & Cardiac Risk Stratification   Activity Barriers Deconditioning;Muscular Weakness;Shortness of Breath           6 Minute Walk:  6 Minute Walk    Row Name 08/28/19 1120         6 Minute Walk   Phase Initial     Distance 1218 feet     Walk Time 6 minutes     # of Rest Breaks 0     MPH 2.31     METS 3.11     RPE 12     Perceived Dyspnea  2     VO2 Peak 10.9     Symptoms No  Resting HR 83 bpm     Resting BP 136/80     Resting Oxygen Saturation  96 %     Exercise Oxygen Saturation  during 6 min walk 93 %     Max Ex. HR 119 bpm     Max Ex. BP 136/76     2 Minute Post BP 148/82       Interval HR    1 Minute HR 112     2 Minute HR 116     3 Minute HR 116     4 Minute HR 117     5 Minute HR 116     6 Minute HR 119     2 Minute Post HR 97     Interval Heart Rate? Yes       Interval Oxygen   Interval Oxygen? Yes     Baseline Oxygen Saturation % 96 %     1 Minute Oxygen Saturation % 94 %     1 Minute Liters of Oxygen 0 L     2 Minute Oxygen Saturation % 94 %     2 Minute Liters of Oxygen 0 L     3 Minute Oxygen Saturation % 94 %     3 Minute Liters of Oxygen 0 L     4 Minute Oxygen Saturation % 94 %     4 Minute Liters of Oxygen 0 L     5 Minute Oxygen Saturation % 93 %     5 Minute Liters of Oxygen 0 L     6 Minute Oxygen Saturation % 93 %     6 Minute Liters of Oxygen 0 L     2 Minute Post Oxygen Saturation % 94 %     2 Minute Post Liters of Oxygen 0 L            Oxygen Initial Assessment:  Oxygen Initial Assessment - 08/28/19 1113      Home Oxygen   Home Oxygen Device Portable Concentrator;E-Tanks    Sleep Oxygen Prescription None    Home Exercise Oxygen Prescription None   received liver transplant 07/2019 and requires less oxygen every week, currently not using any oxygen at home.   Home at Rest Exercise Oxygen Prescription None    Compliance with Home Oxygen Use Yes      Initial 6 min Walk   Oxygen Used None      Program Oxygen Prescription   Program Oxygen Prescription None      Intervention   Short Term Goals To learn and exhibit compliance with exercise, home and travel O2 prescription;To learn and understand importance of maintaining oxygen saturations>88%;To learn and demonstrate proper use of respiratory medications;To learn and understand importance of monitoring SPO2 with pulse oximeter and demonstrate accurate use of the pulse oximeter.;To learn and demonstrate proper pursed lip breathing techniques or other breathing techniques.    Long  Term Goals Exhibits compliance with exercise, home and travel O2 prescription;Verbalizes importance of monitoring  SPO2 with pulse oximeter and return demonstration;Maintenance of O2 saturations>88%;Compliance with respiratory medication;Exhibits proper breathing techniques, such as pursed lip breathing or other method taught during program session           Oxygen Re-Evaluation:  Oxygen Re-Evaluation    Row Name 09/17/19 1453             Program Oxygen Prescription   Program Oxygen Prescription None         Home Oxygen   Home  Oxygen Device None       Sleep Oxygen Prescription None       Home Exercise Oxygen Prescription None       Home at Rest Exercise Oxygen Prescription None       Compliance with Home Oxygen Use Yes         Goals/Expected Outcomes   Short Term Goals To learn and exhibit compliance with exercise, home and travel O2 prescription;To learn and understand importance of monitoring SPO2 with pulse oximeter and demonstrate accurate use of the pulse oximeter.;To learn and understand importance of maintaining oxygen saturations>88%;To learn and demonstrate proper pursed lip breathing techniques or other breathing techniques.;To learn and demonstrate proper use of respiratory medications       Long  Term Goals Exhibits compliance with exercise, home and travel O2 prescription;Verbalizes importance of monitoring SPO2 with pulse oximeter and return demonstration;Maintenance of O2 saturations>88%;Exhibits proper breathing techniques, such as pursed lip breathing or other method taught during program session;Compliance with respiratory medication;Demonstrates proper use of MDI's       Goals/Expected Outcomes compliance              Oxygen Discharge (Final Oxygen Re-Evaluation):  Oxygen Re-Evaluation - 09/17/19 1453      Program Oxygen Prescription   Program Oxygen Prescription None      Home Oxygen   Home Oxygen Device None    Sleep Oxygen Prescription None    Home Exercise Oxygen Prescription None    Home at Rest Exercise Oxygen Prescription None    Compliance with Home Oxygen  Use Yes      Goals/Expected Outcomes   Short Term Goals To learn and exhibit compliance with exercise, home and travel O2 prescription;To learn and understand importance of monitoring SPO2 with pulse oximeter and demonstrate accurate use of the pulse oximeter.;To learn and understand importance of maintaining oxygen saturations>88%;To learn and demonstrate proper pursed lip breathing techniques or other breathing techniques.;To learn and demonstrate proper use of respiratory medications    Long  Term Goals Exhibits compliance with exercise, home and travel O2 prescription;Verbalizes importance of monitoring SPO2 with pulse oximeter and return demonstration;Maintenance of O2 saturations>88%;Exhibits proper breathing techniques, such as pursed lip breathing or other method taught during program session;Compliance with respiratory medication;Demonstrates proper use of MDI's    Goals/Expected Outcomes compliance           Initial Exercise Prescription:  Initial Exercise Prescription - 08/28/19 1500      Date of Initial Exercise RX and Referring Provider   Date 08/28/19    Referring Provider [name], [name], MD      Recumbant Bike   Level 2    Watts 20    Minutes 15    METs 2.76      Track   Laps 13    Minutes 15    METs 2.51      Prescription Details   Frequency (times per week) 2    Duration Progress to 30 minutes of continuous aerobic without signs/symptoms of physical distress      Intensity   THRR 40-80% of Max Heartrate 60-121    Ratings of Perceived Exertion 11-13    Perceived Dyspnea 0-4      Progression   Progression Continue to progress workloads to maintain intensity without signs/symptoms of physical distress.      Resistance Training   Training Prescription No           Perform Capillary Blood Glucose checks as needed.  Exercise  Prescription Changes:  Exercise Prescription Changes    Row Name 09/17/19 1400 10/01/19 1500 10/15/19 1500          Response to Exercise   Blood Pressure (Admit) 138/76 140/78 142/74     Blood Pressure (Exercise) 146/80 146/70 162/80     Blood Pressure (Exit) 150/85 142/78 134/78     Heart Rate (Admit) 84 bpm 85 bpm 77 bpm     Heart Rate (Exercise) 105 bpm 117 bpm 112 bpm     Heart Rate (Exit) 98 bpm 97 bpm 92 bpm     Oxygen Saturation (Admit) 98 % 96 % 97 %     Oxygen Saturation (Exercise) 92 % 93 % 94 %     Oxygen Saturation (Exit) 96 % 97 % 97 %     Rating of Perceived Exertion (Exercise) 13 12 13      Perceived Dyspnea (Exercise) 2 2 2      Duration Continue with 30 min of aerobic exercise without signs/symptoms of physical distress. Continue with 30 min of aerobic exercise without signs/symptoms of physical distress. Continue with 30 min of aerobic exercise without signs/symptoms of physical distress.     Intensity THRR unchanged THRR unchanged THRR unchanged       Progression   Progression Continue to progress workloads to maintain intensity without signs/symptoms of physical distress. Continue to progress workloads to maintain intensity without signs/symptoms of physical distress. Continue to progress workloads to maintain intensity without signs/symptoms of physical distress.     Average METs 2.3 2.8 3       Resistance Training   Training Prescription No  On weight restrictions Yes Yes     Weight -- 3lbs 3lbs     Reps -- 10-15 10-15     Time -- 10 Minutes 10 Minutes       Recumbant Bike   Level 2 3 3      Watts 20 20 20      Minutes 15 15 15      METs 2.3 2.8 2.8       Track   Laps 18 15 15      Minutes 15 15 15      METs 3.09 -- 2.74       Home Exercise Plan   Plans to continue exercise at Home (comment) -- --     Frequency Add 3 additional days to program exercise sessions. -- --     Initial Home Exercises Provided 09/17/19 -- --            Exercise Comments:  Exercise Comments    Row Name 09/05/19 1507 09/17/19 1439 09/18/19 1454       Exercise Comments Pt completed his first  day of exercise in pulmonary rehab today. He tolerated exercise well, but experience muscle weakness in his legs while doing squats. He was fatigued at the end of his workout. Otherwise he tolerated his exercise session well. home exercise reviewed Pt walking 30 minutes  mostly 7 days a week.  Pt is doing very well and is off to a wonderful start with the completion of 4 exercose sessions.            Exercise Goals and Review:  Exercise Goals    Row Name 08/28/19 1130 09/17/19 1457           Exercise Goals   Increase Physical Activity Yes Yes      Intervention Provide advice, education, support and counseling about physical activity/exercise needs.;Develop an individualized exercise prescription for aerobic  and resistive training based on initial evaluation findings, risk stratification, comorbidities and participant's personal goals. Provide advice, education, support and counseling about physical activity/exercise needs.;Develop an individualized exercise prescription for aerobic and resistive training based on initial evaluation findings, risk stratification, comorbidities and participant's personal goals.      Expected Outcomes Short Term: Attend rehab on a regular basis to increase amount of physical activity.;Long Term: Exercising regularly at least 3-5 days a week.;Long Term: Add in home exercise to make exercise part of routine and to increase amount of physical activity. Short Term: Attend rehab on a regular basis to increase amount of physical activity.;Long Term: Add in home exercise to make exercise part of routine and to increase amount of physical activity.;Long Term: Exercising regularly at least 3-5 days a week.      Increase Strength and Stamina Yes Yes      Intervention Provide advice, education, support and counseling about physical activity/exercise needs.;Develop an individualized exercise prescription for aerobic and resistive training based on initial evaluation findings,  risk stratification, comorbidities and participant's personal goals. Provide advice, education, support and counseling about physical activity/exercise needs.;Develop an individualized exercise prescription for aerobic and resistive training based on initial evaluation findings, risk stratification, comorbidities and participant's personal goals.      Expected Outcomes Short Term: Increase workloads from initial exercise prescription for resistance, speed, and METs.;Short Term: Perform resistance training exercises routinely during rehab and add in resistance training at home;Long Term: Improve cardiorespiratory fitness, muscular endurance and strength as measured by increased METs and functional capacity (6MWT) Short Term: Increase workloads from initial exercise prescription for resistance, speed, and METs.;Short Term: Perform resistance training exercises routinely during rehab and add in resistance training at home;Long Term: Improve cardiorespiratory fitness, muscular endurance and strength as measured by increased METs and functional capacity (6MWT)      Able to understand and use rate of perceived exertion (RPE) scale Yes Yes      Intervention Provide education and explanation on how to use RPE scale Provide education and explanation on how to use RPE scale      Expected Outcomes Short Term: Able to use RPE daily in rehab to express subjective intensity level;Long Term:  Able to use RPE to guide intensity level when exercising independently Short Term: Able to use RPE daily in rehab to express subjective intensity level;Long Term:  Able to use RPE to guide intensity level when exercising independently      Able to understand and use Dyspnea scale Yes Yes      Intervention Provide education and explanation on how to use Dyspnea scale Provide education and explanation on how to use Dyspnea scale      Expected Outcomes Short Term: Able to use Dyspnea scale daily in rehab to express subjective sense of  shortness of breath during exertion;Long Term: Able to use Dyspnea scale to guide intensity level when exercising independently Short Term: Able to use Dyspnea scale daily in rehab to express subjective sense of shortness of breath during exertion;Long Term: Able to use Dyspnea scale to guide intensity level when exercising independently      Knowledge and understanding of Target Heart Rate Range (THRR) Yes Yes      Intervention Provide education and explanation of THRR including how the numbers were predicted and where they are located for reference Provide education and explanation of THRR including how the numbers were predicted and where they are located for reference      Expected Outcomes Short Term:  Able to state/look up THRR;Long Term: Able to use THRR to govern intensity when exercising independently;Short Term: Able to use daily as guideline for intensity in rehab Short Term: Able to state/look up THRR;Long Term: Able to use THRR to govern intensity when exercising independently;Short Term: Able to use daily as guideline for intensity in rehab      Understanding of Exercise Prescription Yes Yes      Intervention Provide education, explanation, and written materials on patient's individual exercise prescription Provide education, explanation, and written materials on patient's individual exercise prescription      Expected Outcomes Short Term: Able to explain program exercise prescription;Long Term: Able to explain home exercise prescription to exercise independently Short Term: Able to explain program exercise prescription;Long Term: Able to explain home exercise prescription to exercise independently             Exercise Goals Re-Evaluation :  Exercise Goals Re-Evaluation    [place] Name 09/17/19 1454             Exercise Goal Re-Evaluation   Exercise Goals Review Increase Physical Activity;Increase Strength and Stamina;Able to understand and use rate of perceived exertion (RPE)  scale;Able to understand and use Dyspnea scale;Knowledge and understanding of Target Heart Rate Range (THRR);Understanding of Exercise Prescription       Comments pt has completed 4 exercise sessions with pulmonary rehab and has been tolerating exercise well. He is positive about exercising and incorporates walking most days of the week outside of rehab. He is currently working at 3 METs while walking the track and 2.3 METs on the Recumbent bike. We will continue to monitor and progress as he is able.       Expected Outcomes Through exercise at pulmonary rehab and home the patient will decrease shortness of breath with daily activities and feel confident in carrying out an exercise prescription at home              Discharge Exercise Prescription (Final Exercise Prescription Changes):  Exercise Prescription Changes - 10/15/19 1500      Response to Exercise   Blood Pressure (Admit) 142/74    Blood Pressure (Exercise) 162/80    Blood Pressure (Exit) 134/78    Heart Rate (Admit) 77 bpm    Heart Rate (Exercise) 112 bpm    Heart Rate (Exit) 92 bpm    Oxygen Saturation (Admit) 97 %    Oxygen Saturation (Exercise) 94 %    Oxygen Saturation (Exit) 97 %    Rating of Perceived Exertion (Exercise) 13    Perceived Dyspnea (Exercise) 2    Duration Continue with 30 min of aerobic exercise without signs/symptoms of physical distress.    Intensity THRR unchanged      Progression   Progression Continue to progress workloads to maintain intensity without signs/symptoms of physical distress.    Average METs 3      Resistance Training   Training Prescription Yes    Weight 3lbs    Reps 10-15    Time 10 Minutes      Recumbant Bike   Level 3    Watts 20    Minutes 15    METs 2.8      Track   Laps 15    Minutes 15    METs 2.74           Nutrition:  Target Goals: Understanding of nutrition guidelines, daily intake of sodium <1522m, cholesterol <2075m calories 30% from fat and 7% or less  from saturated  fats, daily to have 5 or more servings of fruits and vegetables.  Biometrics:  Pre Biometrics - 08/28/19 1100      Pre Biometrics   Height 5' 9"  (1.753 m)    Weight 82.5 kg    BMI (Calculated) 26.85    Grip Strength 24 kg            Nutrition Therapy Plan and Nutrition Goals:  Nutrition Therapy & Goals - 09/10/19 1437      Nutrition Therapy   Diet High cal/high protein      Personal Nutrition Goals   Nutrition Goal Pt to identify food quantities necessary to achieve weight gain 0.25-1 lb per week during time in pulmonary rehab    Personal Goal #2 Pt to incorporate minimum of 1 protein shake daily and 2 servings nuts/trail mix daily      Intervention Plan   Intervention Prescribe, educate and counsel regarding individualized specific dietary modifications aiming towards targeted core components such as weight, hypertension, lipid management, diabetes, heart failure and other comorbidities.;Nutrition handout(s) given to patient.    Expected Outcomes Short Term Goal: A plan has been developed with personal nutrition goals set during dietitian appointment.           Nutrition Assessments:   Nutrition Goals Re-Evaluation:  Nutrition Goals Re-Evaluation    [place] Name 09/10/19 1438 10/15/19 0757           Goals   Current Weight 181 lb (82.1 kg) 183 lb 13.8 oz (83.4 kg)      Nutrition Goal Pt to identify food quantities necessary to achieve weight gain 0.25-1 lb per week during time in pulmonary rehab --        Personal Goal #2 Re-Evaluation   Personal Goal #2 Pt to incorporate minimum of 1 protein shake daily and 2 servings nuts/trail mix daily --             Nutrition Goals Discharge (Final Nutrition Goals Re-Evaluation):  Nutrition Goals Re-Evaluation - 10/15/19 0757      Goals   Current Weight 183 lb 13.8 oz (83.4 kg)           Psychosocial: Target Goals: Acknowledge presence or absence of significant depression and/or stress, maximize coping  skills, provide positive support system. Participant is able to verbalize types and ability to use techniques and skills needed for reducing stress and depression.  Initial Review & Psychosocial Screening:  Initial Psych Review & Screening - 08/28/19 1143      Initial Review   Current issues with None Identified      Family Dynamics   Good Support System? Yes      Barriers   Psychosocial barriers to participate in program There are no identifiable barriers or psychosocial needs.      Screening Interventions   Interventions Encouraged to exercise           Quality of Life Scores:  Scores of 19 and below usually indicate a poorer quality of life in these areas.  A difference of  2-3 points is a clinically meaningful difference.  A difference of 2-3 points in the total score of the Quality of Life Index has been associated with significant improvement in overall quality of life, self-image, physical symptoms, and general health in studies assessing change in quality of life.  PHQ-9: Recent Review Flowsheet Data    Depression screen [facility] 2/9 08/28/2019 10/18/2018 10/18/2018 01/26/2018 04/05/2017   Decreased Interest 0 - 0 0 2   Down, Depressed,  Hopeless 0 0 0 0 0   PHQ - 2 Score 0 0 0 0 2   Altered sleeping 0 0 - 1 0   Tired, decreased energy 0 0 - 1 1   Change in appetite 0 0 - 0 1   Feeling bad or failure about yourself  0 0 - 0 0   Trouble concentrating 0 0 - 0 0   Moving slowly or fidgety/restless 0 0 - 0 0   Suicidal thoughts 0 0 - 0 0   PHQ-9 Score 0 0 - 2 4   Difficult doing work/chores - Not difficult at all - Not difficult at all Not difficult at all     Interpretation of Total Score  Total Score Depression Severity:  1-4 = Minimal depression, 5-9 = Mild depression, 10-14 = Moderate depression, 15-19 = Moderately severe depression, 20-27 = Severe depression   Psychosocial Evaluation and Intervention:  Psychosocial Evaluation - 08/28/19 1143      Psychosocial  Evaluation & Interventions   Interventions Encouraged to exercise with the program and follow exercise prescription    Comments No barriers or concerns were identified at this time.    Expected Outcomes For Shivaan to continue to be free of barriers or psychosocial concerns while participating in pulmonary rehab.    Continue Psychosocial Services  No Follow up required           Psychosocial Re-Evaluation:  Psychosocial Re-Evaluation    [place] Name 08/28/19 1144 09/18/19 1456 09/18/19 1502 10/16/19 2312       Psychosocial Re-Evaluation   Current issues with None Identified None Identified -- --    Comments -- Pt with supportive wife.  Pt is pleased with the progress he has made and how well he is doing both physically and mentally Pt with supportive wife.  Pt is pleased with the progress he has made and how well he is doing both physically and mentally.  Pt reports some sleep concerns but this is associated with back and incisional discomfort. Winifred has supportive wife.  Pt is pleased with the progress he has made and how well he is doing both physically and mentally.  Rynell has increased his laps on the track.  Pt reports some sleep concerns but this is associated with back and incisional discomfort however this is improving.    Expected Outcomes -- That Euell continues to have no barriers or psychosocial concerns while in pulmonary rehab with countinued positve healthy coping skills and outlook. -- That Kallon continues to have no barriers or psychosocial concerns while in pulmonary rehab with countinued positve healthy coping skills and outlook.    Interventions -- Encouraged to attend Pulmonary Rehabilitation for the exercise -- Encouraged to attend Pulmonary Rehabilitation for the exercise;Relaxation education;Stress management education    Continue Psychosocial Services  -- Follow up required by staff  Will continue to monitor and reasses pt mental well being. -- --           Psychosocial  Discharge (Final Psychosocial Re-Evaluation):  Psychosocial Re-Evaluation - 10/16/19 2312      Psychosocial Re-Evaluation   Comments Gillian has supportive wife.  Pt is pleased with the progress he has made and how well he is doing both physically and mentally.  Dayshawn has increased his laps on the track.  Pt reports some sleep concerns but this is associated with back and incisional discomfort however this is improving.    Expected Outcomes That Abdulaziz continues to have no barriers or psychosocial  concerns while in pulmonary rehab with countinued positve healthy coping skills and outlook.    Interventions Encouraged to attend Pulmonary Rehabilitation for the exercise;Relaxation education;Stress management education           Education: Education Goals: Education classes will be provided on a weekly basis, covering required topics. Participant will state understanding/return demonstration of topics presented.  Learning Barriers/Preferences:  Learning Barriers/Preferences - 08/28/19 1145      Learning Barriers/Preferences   Learning Barriers None    Learning Preferences Audio;Group Instruction;Individual Instruction;Pictoral;Skilled Demonstration;Verbal Instruction;Video;Written Material           Education Topics: Risk Factor Reduction:  -Group instruction that is supported by a PowerPoint presentation. Instructor discusses the definition of a risk factor, different risk factors for pulmonary disease, and how the heart and lungs work together.     PULMONARY REHAB OTHER RESPIRATORY from 01/29/2019 in [place]  Date 01/08/19  Educator --  [Handout]      Nutrition for Pulmonary Patient:  -Group instruction provided by PowerPoint slides, verbal discussion, and written materials to support subject matter. The instructor gives an explanation and review of healthy diet recommendations, which includes a discussion on weight management, recommendations for  fruit and vegetable consumption, as well as protein, fluid, caffeine, fiber, sodium, sugar, and alcohol. Tips for eating when patients are short of breath are discussed.   PULMONARY REHAB OTHER RESPIRATORY from 10/15/2019 in [place]  Date 10/03/19  Educator Handout  Instruction Review Code 2- Demonstrated Understanding      Pursed Lip Breathing:  -Group instruction that is supported by demonstration and informational handouts. Instructor discusses the benefits of pursed lip and diaphragmatic breathing and detailed demonstration on how to preform both.     Oxygen Safety:  -Group instruction provided by PowerPoint, verbal discussion, and written material to support subject matter. There is an overview of "What is Oxygen" and "Why do we need it".  Instructor also reviews how to create a safe environment for oxygen use, the importance of using oxygen as prescribed, and the risks of noncompliance. There is a brief discussion on traveling with oxygen and resources the patient may utilize.   Oxygen Equipment:  -Group instruction provided by [facility] Staff utilizing handouts, written materials, and equipment demonstrations.   Signs and Symptoms:  -Group instruction provided by written material and verbal discussion to support subject matter. Warning signs and symptoms of infection, stroke, and heart attack are reviewed and when to call the physician/911 reinforced. Tips for preventing the spread of infection discussed.   Advanced Directives:  -Group instruction provided by verbal instruction and written material to support subject matter. Instructor reviews Advanced Directive laws and proper instruction for filling out document.   Pulmonary Video:  -Group video education that reviews the importance of medication and oxygen compliance, exercise, good nutrition, pulmonary hygiene, and pursed lip and diaphragmatic breathing for the pulmonary  patient.   Exercise for the Pulmonary Patient:  -Group instruction that is supported by a PowerPoint presentation. Instructor discusses benefits of exercise, core components of exercise, frequency, duration, and intensity of an exercise routine, importance of utilizing pulse oximetry during exercise, safety while exercising, and options of places to exercise outside of rehab.     Pulmonary Medications:  -Verbally interactive group education provided by instructor with focus on inhaled medications and proper administration.   PULMONARY REHAB OTHER RESPIRATORY from 09/19/2019 in [place]  Date 09/19/19  Educator  handout  Instruction Review Code 2- Demonstrated Understanding      Anatomy and Physiology of the Respiratory System and Intimacy:  -Group instruction provided by PowerPoint, verbal discussion, and written material to support subject matter. Instructor reviews respiratory cycle and anatomical components of the respiratory system and their functions. Instructor also reviews differences in obstructive and restrictive respiratory diseases with examples of each. Intimacy, Sex, and Sexuality differences are reviewed with a discussion on how relationships can change when diagnosed with pulmonary disease. Common sexual concerns are reviewed.   MD DAY -A group question and answer session with a medical doctor that allows participants to ask questions that relate to their pulmonary disease state.   OTHER EDUCATION -Group or individual verbal, written, or video instructions that support the educational goals of the pulmonary rehab program.   PULMONARY REHAB OTHER RESPIRATORY from 10/15/2019 in [place]  Date 10/15/19  Educator Handout  Instruction Review Code 2- Demonstrated Understanding      Holiday Eating Survival Tips:  -Group instruction provided by PowerPoint slides, verbal discussion, and written materials to  support subject matter. The instructor gives patients tips, tricks, and techniques to help them not only survive but enjoy the holidays despite the onslaught of food that accompanies the holidays.   PULMONARY REHAB OTHER RESPIRATORY from 01/29/2019 in [place]  Date 12/25/18  Educator 01/08/19  [Handout]      Knowledge Questionnaire Score:  Knowledge Questionnaire Score - 09/05/19 1503      Knowledge Questionnaire Score   Pre Score 17/18           Core Components/Risk Factors/Patient Goals at Admission:  Personal Goals and Risk Factors at Admission - 08/28/19 1145      Core Components/Risk Factors/Patient Goals on Admission   Improve shortness of breath with ADL's Yes    Intervention Provide education, individualized exercise plan and daily activity instruction to help decrease symptoms of SOB with activities of daily living.    Expected Outcomes Short Term: Improve cardiorespiratory fitness to achieve a reduction of symptoms when performing ADLs;Long Term: Be able to perform more ADLs without symptoms or delay the onset of symptoms           Core Components/Risk Factors/Patient Goals Review:   Goals and Risk Factor Review    Row Name 08/28/19 1145 09/18/19 1459 10/16/19 2316         Core Components/Risk Factors/Patient Goals Review   Personal Goals Review Increase knowledge of respiratory medications and ability to use respiratory devices properly.;Improve shortness of breath with ADL's;Develop more efficient breathing techniques such as purse lipped breathing and diaphragmatic breathing and practicing self-pacing with activity. Increase knowledge of respiratory medications and ability to use respiratory devices properly.;Improve shortness of breath with ADL's;Develop more efficient breathing techniques such as purse lipped breathing and diaphragmatic breathing and practicing self-pacing with activity. --     Review -- Einar is off to a  wonderful start with the completion of 4 exercise sessions.  Pt has pulse ox and understands how to use it as well as acceptable parameters.  Pt continues to use the incentive spirometer he was given post operatively.  Pt able to demonstrate efficient breathing techniques such as PLB.  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 Aquil is doing well and has completed 11  exercise sessions.  Pt has pulse ox and understands how to use it as well as acceptable parameters.  Pt continues to use  the incentive spirometer he was given post operatively.  Pt able to demonstrate efficient breathing techniques such as PLB.  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 Pt has increased his MET level with increasing workloads.     Expected Outcomes -- -- See admission goals.            Core Components/Risk Factors/Patient Goals at Discharge (Final Review):   Goals and Risk Factor Review - 10/16/19 2316      Core Components/Risk Factors/Patient Goals Review   Review Hari is doing well and has completed 11  exercise sessions.  Pt has pulse ox and understands how to use it as well as acceptable parameters.  Pt continues to use the incentive spirometer he was given post operatively.  Pt able to demonstrate efficient breathing techniques such as PLB.  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 Pt has increased his MET level with increasing workloads.    Expected Outcomes See admission goals.           ITP Comments:  ITP Comments    Row Name 09/18/19 1453           ITP Comments Dr. Rodman Pickle, Medical Director [facility]              Comments:  Evie has completed 11 exercise session in Pulmonary rehab. Pt maintains good attendance and consistent home exercise. Pulmonary rehab staff will  continue to monitor and reassess progress toward goals during her participation in Pulmonary Rehab. Cherre Huger, BSN Cardiac and Training and development officer

## 2019-10-22 ENCOUNTER — Encounter (HOSPITAL_COMMUNITY)
Admission: RE | Admit: 2019-10-22 | Discharge: 2019-10-22 | Disposition: A | Discharge status: Home / Self Care | Payer: 59 | Source: Ambulatory Visit | Attending: Cardiology | Admitting: Cardiology

## 2019-10-22 ENCOUNTER — Other Ambulatory Visit: Payer: Self-pay

## 2019-10-22 DIAGNOSIS — Z8719 Personal history of other diseases of the digestive system: Secondary | ICD-10-CM | POA: Diagnosis not present

## 2019-10-22 DIAGNOSIS — K7681 Hepatopulmonary syndrome: Secondary | ICD-10-CM | POA: Diagnosis not present

## 2019-10-22 NOTE — Progress Notes (Signed)
Daily Session Note  Patient Details  Name: William [name] MRN: [id number] Date of Birth: 1950/08/10 Referring Provider:     Pulmonary Rehab Walk Test from 08/28/2019 in [place]  Referring Provider [name], [name], MD      Encounter Date: 10/22/2019  Check In:  Session Check In - 10/22/19 1434      Check-In   Supervising physician immediately available to respond to emergencies [facility] immediately available    Physician(s) Dr. Cathlean Sauer    Location [facility]    Staff Present Maurice Small, RN, Bjorn Loser, MS, CEP, Exercise Physiologist;Lisa Jani Gravel, MS, ACSM-CEP, Exercise Physiologist    Virtual Visit No    Medication changes reported     No    Fall or balance concerns reported    No    Tobacco Cessation No Change    Warm-up and Cool-down Performed on first and last piece of equipment    Resistance Training Performed Yes    VAD Patient? No    PAD/SET Patient? No      Pain Assessment   Currently in Pain? No/denies    Pain Score 0-No pain    Multiple Pain Sites No           Capillary Blood Glucose: No results found for this or any previous visit (from the past 24 hour(s)).    Social History   Tobacco Use  Smoking Status Former Smoker  . Types: Cigarettes  Smokeless Tobacco Never Used  Tobacco Comment   QUIT I 1983    Goals Met:  Proper associated with RPD/PD & O2 Sat Independence with exercise equipment Exercise tolerated well No report of cardiac concerns or symptoms Strength training completed today  Goals Unmet:  Not Applicable  Comments: Service time is from 1305 to 1410    Dr. Fransico Him is Medical Director for Cardiac Rehab at [facility].

## 2019-10-24 ENCOUNTER — Encounter (HOSPITAL_COMMUNITY): Payer: 59

## 2019-10-24 ENCOUNTER — Other Ambulatory Visit (HOSPITAL_COMMUNITY): Payer: Self-pay | Admitting: Physician Assistant

## 2019-10-24 DIAGNOSIS — L578 Other skin changes due to chronic exposure to nonionizing radiation: Secondary | ICD-10-CM | POA: Diagnosis not present

## 2019-10-24 DIAGNOSIS — N1832 Chronic kidney disease, stage 3b: Secondary | ICD-10-CM | POA: Diagnosis not present

## 2019-10-24 DIAGNOSIS — D692 Other nonthrombocytopenic purpura: Secondary | ICD-10-CM | POA: Diagnosis not present

## 2019-10-24 DIAGNOSIS — I712 Thoracic aortic aneurysm, without rupture: Secondary | ICD-10-CM | POA: Diagnosis not present

## 2019-10-24 DIAGNOSIS — I129 Hypertensive chronic kidney disease with stage 1 through stage 4 chronic kidney disease, or unspecified chronic kidney disease: Secondary | ICD-10-CM | POA: Diagnosis not present

## 2019-10-24 DIAGNOSIS — D84821 Immunodeficiency due to drugs: Secondary | ICD-10-CM | POA: Diagnosis not present

## 2019-10-24 DIAGNOSIS — Z8709 Personal history of other diseases of the respiratory system: Secondary | ICD-10-CM | POA: Diagnosis not present

## 2019-10-24 DIAGNOSIS — D849 Immunodeficiency, unspecified: Secondary | ICD-10-CM | POA: Diagnosis not present

## 2019-10-24 DIAGNOSIS — Z944 Liver transplant status: Secondary | ICD-10-CM | POA: Diagnosis not present

## 2019-10-24 DIAGNOSIS — Z4823 Encounter for aftercare following liver transplant: Secondary | ICD-10-CM | POA: Diagnosis not present

## 2019-10-24 DIAGNOSIS — L570 Actinic keratosis: Secondary | ICD-10-CM | POA: Diagnosis not present

## 2019-10-24 DIAGNOSIS — J849 Interstitial pulmonary disease, unspecified: Secondary | ICD-10-CM | POA: Diagnosis not present

## 2019-10-24 DIAGNOSIS — N179 Acute kidney failure, unspecified: Secondary | ICD-10-CM | POA: Diagnosis not present

## 2019-10-24 DIAGNOSIS — Z23 Encounter for immunization: Secondary | ICD-10-CM | POA: Diagnosis not present

## 2019-10-24 DIAGNOSIS — L720 Epidermal cyst: Secondary | ICD-10-CM | POA: Diagnosis not present

## 2019-10-24 LAB — COMPREHENSIVE METABOLIC PANEL
Albumin: 3.9 (ref 3.5–5.0)
Calcium: 9 (ref 8.7–10.7)

## 2019-10-24 LAB — HEPATIC FUNCTION PANEL
ALT: 13 (ref 10–40)
AST: 12 — AB (ref 14–40)
Alkaline Phosphatase: 50 (ref 25–125)
Bilirubin, Total: 0.7

## 2019-10-24 LAB — BASIC METABOLIC PANEL
BUN: 16 (ref 4–21)
CO2: 23 — AB (ref 13–22)
Chloride: 101 (ref 99–108)
Creatinine: 1.6 — AB (ref 0.6–1.3)
Glucose: 103
Potassium: 4.2 (ref 3.4–5.3)
Sodium: 132 — AB (ref 137–147)

## 2019-10-24 LAB — CBC AND DIFFERENTIAL
HCT: 43 (ref 41–53)
Hemoglobin: 14 (ref 13.5–17.5)
Neutrophils Absolute: 3
Platelets: 177 (ref 150–399)
WBC: 5.2

## 2019-10-24 LAB — CBC: RBC: 5.05 (ref 3.87–5.11)

## 2019-10-24 MED FILL — MAGNESIUM OXIDE 400 MG TAB: 400 (240 MG | 30 days supply | Qty: 60 | Fill #0

## 2019-10-28 ENCOUNTER — Other Ambulatory Visit (HOSPITAL_COMMUNITY): Payer: Self-pay | Admitting: Internal Medicine

## 2019-10-28 MED FILL — PANTOPRAZOLE SOD DR 40 MG T: 40 | 90 days supply | Qty: 90 | Fill #0

## 2019-10-28 MED FILL — predniSONE 5 MG TABS: 5 | 30 days supply | Qty: 105 | Fill #1

## 2019-10-28 MED FILL — TACROLIMUS 1 MG CAPSULE: 1 | 30 days supply | Qty: 180 | Fill #1

## 2019-10-29 ENCOUNTER — Other Ambulatory Visit: Payer: Self-pay

## 2019-10-29 ENCOUNTER — Encounter: Payer: Self-pay | Admitting: Family Medicine

## 2019-10-29 ENCOUNTER — Encounter (HOSPITAL_COMMUNITY)
Admission: RE | Admit: 2019-10-29 | Discharge: 2019-10-29 | Disposition: A | Discharge status: Home / Self Care | Payer: 59 | Source: Ambulatory Visit | Attending: Cardiology | Admitting: Cardiology

## 2019-10-29 ENCOUNTER — Ambulatory Visit: Payer: 59

## 2019-10-29 VITALS — Wt 186.1 lb

## 2019-10-29 DIAGNOSIS — K7681 Hepatopulmonary syndrome: Secondary | ICD-10-CM

## 2019-10-29 DIAGNOSIS — Z8719 Personal history of other diseases of the digestive system: Secondary | ICD-10-CM | POA: Diagnosis not present

## 2019-10-29 MED FILL — METOPROLOL TARTRATE 25 MG T: 25 | 30 days supply | Qty: 60 | Fill #0

## 2019-10-29 MED FILL — MYCOPHENOLATE 250 MG CAP: 250 | 30 days supply | Qty: 240 | Fill #2

## 2019-10-29 NOTE — Progress Notes (Signed)
Daily Session Note  Patient Details  Name: William [name] MRN: [id number] Date of Birth: [birth year] Referring Provider:     Pulmonary Rehab Walk Test from 08/28/2019 in [place]  Referring Provider [name], [name], MD      Encounter Date: 10/29/2019  Check In:  Session Check In - 10/29/19 1410      Check-In   Supervising physician immediately available to respond to emergencies [facility] immediately available    Physician(s) Dr. Lonny Prude    [facility]    Staff Present Maurice Small, RN, Bjorn Loser, MS, CEP, Exercise Physiologist;Lisa Jani Gravel, MS, ACSM-CEP, Exercise Physiologist    Virtual Visit No    Medication changes reported     No    Fall or balance concerns reported    No    Tobacco Cessation No Change    Warm-up and Cool-down Performed on first and last piece of equipment    Resistance Training Performed Yes    VAD Patient? No    PAD/SET Patient? No      Pain Assessment   Currently in Pain? No/denies    Pain Score 0-No pain    Multiple Pain Sites No           Capillary Blood Glucose: No results found for this or any previous visit (from the past 24 hour(s)).   Exercise Prescription Changes - 10/29/19 1400      Response to Exercise   Blood Pressure (Admit) 138/82    Blood Pressure (Exercise) 164/80    Blood Pressure (Exit) 140/80    Heart Rate (Admit) 72 bpm    Heart Rate (Exercise) 109 bpm    Heart Rate (Exit) 83 bpm    Oxygen Saturation (Admit) 97 %    Oxygen Saturation (Exercise) 94 %    Oxygen Saturation (Exit) 97 %    Rating of Perceived Exertion (Exercise) 13    Perceived Dyspnea (Exercise) 2    Duration Continue with 30 min of aerobic exercise without signs/symptoms of physical distress.    Intensity THRR unchanged      Progression   Progression Continue to progress workloads to maintain intensity without signs/symptoms of physical distress.       Resistance Training   Training Prescription Yes    Weight 5 lbs    Reps 10-15    Time 10 Minutes      Recumbant Bike   Level 3    Minutes 15    METs 2.5      Track   Laps 16    Minutes 15    METs 2.86           Social History   Tobacco Use  Smoking Status Former Smoker  . Types: Cigarettes  Smokeless Tobacco Never Used  Tobacco Comment   QUIT I 1983    Goals Met:  [place]ependence with exercise equipment Exercise tolerated well No report of cardiac concerns or symptoms Strength training completed today  Goals Unmet:  Not Applicable  Comments: Service time is from 1305 to 1400    Dr. Fransico Him is Medical Director for Cardiac Rehab at [facility].

## 2019-10-30 DIAGNOSIS — J181 Lobar pneumonia, unspecified organism: Secondary | ICD-10-CM | POA: Diagnosis not present

## 2019-10-31 ENCOUNTER — Encounter (HOSPITAL_COMMUNITY)
Admission: RE | Admit: 2019-10-31 | Discharge: 2019-10-31 | Disposition: A | Discharge status: Home / Self Care | Payer: 59 | Source: Ambulatory Visit | Attending: Cardiology | Admitting: Cardiology

## 2019-10-31 ENCOUNTER — Other Ambulatory Visit: Payer: Self-pay

## 2019-10-31 DIAGNOSIS — K7681 Hepatopulmonary syndrome: Secondary | ICD-10-CM | POA: Diagnosis not present

## 2019-10-31 DIAGNOSIS — Z8719 Personal history of other diseases of the digestive system: Secondary | ICD-10-CM | POA: Diagnosis not present

## 2019-10-31 NOTE — Progress Notes (Signed)
Daily Session Note  Patient Details  Name: William [name] MRN: [id number] Date of Birth: 10/11/1950 Referring Provider:     Pulmonary Rehab Walk Test from 08/28/2019 in [place]  Referring Provider [name], [name], MD      Encounter Date: 10/31/2019  Check In:  Session Check In - 10/31/19 1348      Check-In   Supervising physician immediately available to respond to emergencies [facility] immediately available    Physician(s) Dr. Tana Coast    [facility]    Staff Present Maurice Small, RN, BSN;Lisa [name], RN;Gisella Alwine Hassell Done, MS, ACSM-CEP, Exercise Physiologist    Virtual Visit No    Medication changes reported     No    Fall or balance concerns reported    No    Tobacco Cessation No Change    Warm-up and Cool-down Performed on first and last piece of equipment    Resistance Training Performed Yes    VAD Patient? No    PAD/SET Patient? No      Pain Assessment   Currently in Pain? No/denies    Pain Score 0-No pain    Multiple Pain Sites No           Capillary Blood Glucose: No results found for this or any previous visit (from the past 24 hour(s)).    Social History   Tobacco Use  Smoking Status Former Smoker  . Types: Cigarettes  Smokeless Tobacco Never Used  Tobacco Comment   QUIT I 1983    Goals Met:  Proper associated with RPD/PD & O2 Sat Independence with exercise equipment Exercise tolerated well No report of cardiac concerns or symptoms Strength training completed today  Goals Unmet:  Not Applicable  Comments: Service time is from 1305 to 1415    Dr. Fransico Him is Medical Director for Cardiac Rehab at [facility].

## 2019-11-05 ENCOUNTER — Other Ambulatory Visit: Payer: Self-pay

## 2019-11-05 ENCOUNTER — Encounter (HOSPITAL_COMMUNITY)
Admission: RE | Admit: 2019-11-05 | Discharge: 2019-11-05 | Disposition: A | Discharge status: Home / Self Care | Payer: 59 | Source: Ambulatory Visit | Attending: Cardiology | Admitting: Cardiology

## 2019-11-05 DIAGNOSIS — K7681 Hepatopulmonary syndrome: Secondary | ICD-10-CM

## 2019-11-05 DIAGNOSIS — Z8719 Personal history of other diseases of the digestive system: Secondary | ICD-10-CM | POA: Diagnosis not present

## 2019-11-05 NOTE — Progress Notes (Signed)
Daily Session Note  Patient Details  Name: William [name] MRN: [id number] Date of Birth: [birth year] Referring Provider:     Pulmonary Rehab Walk Test from 08/28/2019 in [place]  Referring Provider [name], [name], MD      Encounter Date: 11/05/2019  Check In:  Session Check In - 11/05/19 1419      Check-In   Supervising physician immediately available to respond to emergencies [facility] immediately available    Physician(s) Dr. Doristine Bosworth    [facility]    Staff Present Hoy Register, MS, CEP, Exercise Physiologist;David Lilyan Punt, MS, EP-C, CCRP;Alieah Brinton Hassell Done, MS, ACSM-CEP, Exercise Physiologist;Portia Rollene Rotunda, RN, BSN    Virtual Visit No    Medication changes reported     No    Fall or balance concerns reported    No    Tobacco Cessation No Change    Warm-up and Cool-down Performed on first and last piece of equipment    Resistance Training Performed Yes    VAD Patient? No    PAD/SET Patient? No      Pain Assessment   Currently in Pain? No/denies    Pain Score 0-No pain    Multiple Pain Sites No           Capillary Blood Glucose: No results found for this or any previous visit (from the past 24 hour(s)).    Social History   Tobacco Use  Smoking Status Former Smoker  . Types: Cigarettes  Smokeless Tobacco Never Used  Tobacco Comment   QUIT I 1983    Goals Met:  Proper associated with RPD/PD & O2 Sat Independence with exercise equipment Exercise tolerated well No report of cardiac concerns or symptoms Strength training completed today  Goals Unmet:  Not Applicable  Comments: Service time is from 1305 to 1400    Dr. Fransico Him is Medical Director for Cardiac Rehab at [facility].

## 2019-11-06 DIAGNOSIS — I77810 Thoracic aortic ectasia: Secondary | ICD-10-CM | POA: Diagnosis not present

## 2019-11-06 DIAGNOSIS — I350 Nonrheumatic aortic (valve) stenosis: Secondary | ICD-10-CM | POA: Diagnosis not present

## 2019-11-07 ENCOUNTER — Encounter (HOSPITAL_COMMUNITY)
Admission: RE | Admit: 2019-11-07 | Discharge: 2019-11-07 | Disposition: A | Discharge status: Home / Self Care | Payer: 59 | Source: Ambulatory Visit | Attending: Cardiology | Admitting: Cardiology

## 2019-11-07 ENCOUNTER — Other Ambulatory Visit: Payer: Self-pay

## 2019-11-07 DIAGNOSIS — Z8719 Personal history of other diseases of the digestive system: Secondary | ICD-10-CM | POA: Diagnosis not present

## 2019-11-07 DIAGNOSIS — K7681 Hepatopulmonary syndrome: Secondary | ICD-10-CM

## 2019-11-07 DIAGNOSIS — Z944 Liver transplant status: Secondary | ICD-10-CM | POA: Diagnosis not present

## 2019-11-07 DIAGNOSIS — D849 Immunodeficiency, unspecified: Secondary | ICD-10-CM | POA: Diagnosis not present

## 2019-11-07 NOTE — Progress Notes (Signed)
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  acyclovir (ZOVIRAX) 400 MG tablet, Take 400 mg by mouth 2 (two) times daily., Disp: , Rfl:  .  acyclovir (ZOVIRAX) 400 MG tablet, Take 400 mg by mouth in the morning and at bedtime., Disp: , Rfl:  .  aspirin EC 81 MG tablet, Take 81 mg by mouth daily. Swallow whole., Disp: , Rfl:  .  calcium citrate-vitamin D (CITRACAL+D) 315-200 MG-UNIT tablet, Take by mouth., Disp: , Rfl:  .  clotrimazole (MYCELEX) 10 MG troche, , Disp: , Rfl:  .  hydrocortisone 2.5 % cream, APPLY TWICE A DAY TO SCALING AREAS OF BROWS WHEN NEEDED. MAY APPLY TO SCALING INSIDE OF EARS AS WELL, Disp: , Rfl:  .  iron sucrose (VENOFER) 20 MG/ML injection, Inject 750 mg into the vein. Every 60 days (Patient not taking: Reported on 08/28/2019), Disp: , Rfl:  .  lactulose (CHRONULAC) 10 GM/15ML solution, Take 13.3333 g by mouth 2 (two) times a day. (Patient not taking: Reported on 08/14/2019), Disp: , Rfl:  .  metoprolol tartrate (LOPRESSOR) 25 MG tablet, TAKE 0.5 TABLETS (12.5 MG TOTAL) BY MOUTH 2 (TWO) TIMES DAILY., Disp: 90 tablet, Rfl: 1 .  mycophenolate (CELLCEPT) 250 MG capsule, Take 250 mg by mouth 2 (two) times daily. 4 capsules ( total 1000 mg ) every 12 hours, Disp: , Rfl:  .  OCTREOTIDE ACETATE IJ, Inject 30 mcg into the vein every 30 (thirty) days. , Disp: , Rfl:  .  oxyCODONE (OXY IR/ROXICODONE) 5 MG immediate release tablet, Take 5 mg by mouth 5 (five) times daily as needed., Disp: ,  Rfl:  .  pantoprazole (PROTONIX) 40 MG tablet, Take 1 tablet (40 mg total) by mouth 2 (two) times daily before a meal. TAKE 1 TABLET BY MOUTH TWICE DAILY BEFORE A MEAL, Disp: 180 tablet, Rfl: 1 .  potassium chloride SA (KLOR-CON) 20 MEQ tablet, Take 1 tablet (20 mEq total) by mouth 2 (two) times daily. TAKE 1 TABLET (20 MEQ TOTAL) BY MOUTH TWICE A DAY (Patient not taking: Reported on 08/14/2019), Disp: 180 tablet, Rfl: 1 .  predniSONE (DELTASONE) 10 MG tablet, Take 10 mg by mouth daily with breakfast., Disp: , Rfl:  .  spironolactone (ALDACTONE) 25 MG tablet, TAKE 0.5 TABLET (12.5 MG TOTAL) BY MOUTH DAILY., Disp: 45 tablet, Rfl: 1 .  spironolactone (ALDACTONE) 25 MG tablet, Take by mouth. (Patient not taking: Reported on 08/14/2019), Disp: , Rfl:  .  sulfamethoxazole-trimethoprim (BACTRIM DS) 800-160 MG tablet, Take 1 tablet by mouth 3 (three) times a week. Monday , Wednesday  And Friday, Disp: , Rfl:  .  tacrolimus (PROGRAF) 1 MG capsule, Take 1 mg by mouth 2 (two) times daily. Take 3 tablets in am and 2 in evening, Disp: , Rfl:  .  torsemide (DEMADEX) 20 MG tablet, TAKE 2 TABLETS (40 MG TOTAL) BY MOUTH DAILY. (Patient not taking: Reported on 08/14/2019), Disp: 180 tablet, Rfl: 1 .  tranexamic acid (LYSTEDA) 650 MG TABS tablet,  TAKE 1 TABLET BY MOUTH TWICE DAILY BEFORE A MEAL, Disp: 60 tablet, Rfl: 2 No current facility-administered medications for this encounter.  Facility-Administered Medications Ordered in Other Encounters:  .  heparin lock flush 100 unit/mL, 500 Units, Intracatheter, Daily PRN, Truitt Merle, MD .  sodium chloride flush (NS) 0.9 % injection 10 mL, 10 mL, Intracatheter, PRN, Truitt Merle, MD  Past Medical History: Past Medical History:  Diagnosis Date  . AAA (abdominal aortic aneurysm) ([place]) 03/2014   repaired 2020  . Bilateral renal cysts    simple (03/2014 MRI)  . CAD (coronary artery disease)   . Cholelithiases 2018   asymptomatic  . Chronic diastolic heart failure ([place])   .  Diabetes mellitus with complication ([place]) 81/0175   A1c 6.8%  . 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  Improving + off oxygen as of 3 mo s/p liver transplant.  . History of blood transfusion 2018 X 4 dates   "low blood" (09/21/2016)  . History of liver transplant ([place]) 07/2019   [facility]  . Hyperlipemia, mixed    elevated LFTs when on statins.    . Hypertension    Cr bump 04/01/16 so I changed benicar-hct to benicar plain and added amlodipine 5 mg.  . Iron deficiency anemia 05/2016   Acute blood loss anemia: hospitalized, required transfusion x 3 U: colonoscopy and capsule study unrevealing.  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  W/u for ? hemolytic anemia to be pursued by hematologist as outpt.  Dr. [name], GI at [facility] following, too---he rec'd onc do bone marrow bx as of Jan 2019  . Iron deficiency anemia due to chronic blood loss 2018/19   GI: transfusions x >20 required; multiple endoscopies and bleeding scans unrevealing. Lysteda and octreotide + monthly iron infusions as of 04/2017. Iron infusions changed to every other month as of 09/2018 hem f/u.  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 Liver failure ([place]) 2020   NASH cirrhosis  . Lung field abnormal finding on examination    Bibasilar L>R mild insp crackles-->x-ray showed mild interstitial changes/fibrosis/scarring.  Changes noted on all CXRs in 2018/2019.  Liver Transplant eval 03/2018->mild restriction on PFTs but no obstruction.  See further details in [facility] "pulm fibrosis" section  . Microscopic hematuria    Eval unremarkable by Dr. Eulogio Ditch.  . 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 Obesity   . Pulmonary fibrosis ([facility])    PFTs: restrictive lung dz ([facility] transplant eval 05/2018). [facility] pulm eval felt this was likely hepatopulmonary syndrome->causing his hypoxia->low likelihood  of progression (as of 10/04/18 transplant clinic f/u). Pulm rehab helping as of 2020/2021. OFF oxygen as of 10/2019 transp f/u.  [name] Spleen enlarged     Tobacco Use: Social History   Tobacco Use  Smoking Status Former Smoker  . Types: Cigarettes  Smokeless Tobacco Never Used  Tobacco Comment   QUIT I 1983    Labs: Recent Review Flowsheet Data    Labs for ITP Cardiac and Pulmonary Rehab Latest Ref Rng & Units 01/04/2017 01/25/2017 04/05/2017 01/26/2018 05/15/2018   Cholestrol 100 - 199 mg/dL - 162 - - -   LDLCALC 0 - 99 mg/dL - 100(H) - - -   HDL >39 mg/dL - 47 - - -   Trlycerides 0 - 149 mg/dL - 76 - - -   Hemoglobin  A1c - 5.5 - 4.6 4.7 4.7      Capillary Blood Glucose: Lab Results  Component Value Date   GLUCAP 154 (H) 05/11/2017   GLUCAP 109 (H) 12/01/2016   GLUCAP 127 (H) 12/01/2016   GLUCAP 122 (H) 09/23/2016   GLUCAP 162 (H) 09/22/2016     Pulmonary Assessment Scores:  Pulmonary Assessment Scores    Row Name 08/28/19 1113 09/05/19 1504 11/05/19 1518     ADL UCSD   ADL Phase -- Entry Exit   SOB Score total -- 46 13     CAT Score   CAT Score -- 18 6     mMRC Score   mMRC Score 2 -- --         UCSD: Self-administered rating of dyspnea associated with activities of daily living (ADLs) 6-point scale (0 = "not at all" to 5 = "maximal or unable to do because of breathlessness")  Scoring Scores range from 0 to 120.  Minimally important difference is 5 units  CAT: CAT can identify the health impairment of COPD patients and is better correlated with disease progression.  CAT has a scoring range of zero to 40. The CAT score is classified into four groups of low (less than 10), medium (10 - 20), high (21-30) and very high (31-40) based on the impact level of disease on health status. A CAT score over 10 suggests significant symptoms.  A worsening CAT score could be explained by an exacerbation, poor medication adherence, poor inhaler technique, or progression of COPD or  comorbid conditions.  CAT MCID is 2 points  mMRC: mMRC (Modified Medical Research Council) Dyspnea Scale is used to assess the degree of baseline functional disability in patients of respiratory disease due to dyspnea. No minimal important difference is established. A decrease in score of 1 point or greater is considered a positive change.   Pulmonary Function Assessment:  Pulmonary Function Assessment - 08/28/19 1142      Breath   Bilateral Breath Sounds Clear    Shortness of Breath Yes;Limiting activity           Exercise Target Goals: Exercise Program Goal: Individual exercise prescription set using results from initial 6 min walk test and THRR while considering  patient's activity barriers and safety.   Exercise Prescription Goal: Initial exercise prescription builds to 30-45 minutes a day of aerobic activity, 2-3 days per week.  Home exercise guidelines will be given to patient during program as part of exercise prescription that the participant will acknowledge.  Activity Barriers & Risk Stratification:  Activity Barriers & Cardiac Risk Stratification - 08/28/19 1059      Activity Barriers & Cardiac Risk Stratification   Activity Barriers Deconditioning;Muscular Weakness;Shortness of Breath           6 Minute Walk:  6 Minute Walk    Row Name 08/28/19 1120         6 Minute Walk   Phase Initial     Distance 1218 feet     Walk Time 6 minutes     # of Rest Breaks 0     MPH 2.31     METS 3.11     RPE 12     Perceived Dyspnea  2     VO2 Peak 10.9     Symptoms No     Resting HR 83 bpm     Resting BP 136/80     Resting Oxygen Saturation  96 %     Exercise Oxygen  Saturation  during 6 min walk 93 %     Max Ex. HR 119 bpm     Max Ex. BP 136/76     2 Minute Post BP 148/82       Interval HR   1 Minute HR 112     2 Minute HR 116     3 Minute HR 116     4 Minute HR 117     5 Minute HR 116     6 Minute HR 119     2 Minute Post HR 97     Interval Heart Rate?  Yes       Interval Oxygen   Interval Oxygen? Yes     Baseline Oxygen Saturation % 96 %     1 Minute Oxygen Saturation % 94 %     1 Minute Liters of Oxygen 0 L     2 Minute Oxygen Saturation % 94 %     2 Minute Liters of Oxygen 0 L     3 Minute Oxygen Saturation % 94 %     3 Minute Liters of Oxygen 0 L     4 Minute Oxygen Saturation % 94 %     4 Minute Liters of Oxygen 0 L     5 Minute Oxygen Saturation % 93 %     5 Minute Liters of Oxygen 0 L     6 Minute Oxygen Saturation % 93 %     6 Minute Liters of Oxygen 0 L     2 Minute Post Oxygen Saturation % 94 %     2 Minute Post Liters of Oxygen 0 L            Oxygen Initial Assessment:  Oxygen Initial Assessment - 08/28/19 1113      Home Oxygen   Home Oxygen Device Portable Concentrator;E-Tanks    Sleep Oxygen Prescription None    Home Exercise Oxygen Prescription None   received liver transplant 07/2019 and requires less oxygen every week, currently not using any oxygen at home.   Home Resting Oxygen Prescription None    Compliance with Home Oxygen Use Yes      Initial 6 min Walk   Oxygen Used None      Program Oxygen Prescription   Program Oxygen Prescription None      Intervention   Short Term Goals To learn and exhibit compliance with exercise, home and travel O2 prescription;To learn and understand importance of maintaining oxygen saturations>88%;To learn and demonstrate proper use of respiratory medications;To learn and understand importance of monitoring SPO2 with pulse oximeter and demonstrate accurate use of the pulse oximeter.;To learn and demonstrate proper pursed lip breathing techniques or other breathing techniques.    Long  Term Goals Exhibits compliance with exercise, home and travel O2 prescription;Verbalizes importance of monitoring SPO2 with pulse oximeter and return demonstration;Maintenance of O2 saturations>88%;Compliance with respiratory medication;Exhibits proper breathing techniques, such as pursed lip  breathing or other method taught during program session           Oxygen Re-Evaluation:  Oxygen Re-Evaluation    Row Name 09/17/19 1453 10/17/19 1634 11/04/19 1609         Program Oxygen Prescription   Program Oxygen Prescription None None None       Home Oxygen   Home Oxygen Device None None None     Sleep Oxygen Prescription None None None     Home Exercise Oxygen Prescription None None None  Home Resting Oxygen Prescription None None None     Compliance with Home Oxygen Use Yes Yes Yes       Goals/Expected Outcomes   Short Term Goals To learn and exhibit compliance with exercise, home and travel O2 prescription;To learn and understand importance of monitoring SPO2 with pulse oximeter and demonstrate accurate use of the pulse oximeter.;To learn and understand importance of maintaining oxygen saturations>88%;To learn and demonstrate proper pursed lip breathing techniques or other breathing techniques.;To learn and demonstrate proper use of respiratory medications To learn and exhibit compliance with exercise, home and travel O2 prescription;To learn and understand importance of monitoring SPO2 with pulse oximeter and demonstrate accurate use of the pulse oximeter.;To learn and understand importance of maintaining oxygen saturations>88%;To learn and demonstrate proper pursed lip breathing techniques or other breathing techniques.;To learn and demonstrate proper use of respiratory medications To learn and exhibit compliance with exercise, home and travel O2 prescription;To learn and understand importance of monitoring SPO2 with pulse oximeter and demonstrate accurate use of the pulse oximeter.;To learn and understand importance of maintaining oxygen saturations>88%;To learn and demonstrate proper pursed lip breathing techniques or other breathing techniques.;To learn and demonstrate proper use of respiratory medications     Long  Term Goals Exhibits compliance with exercise, home and  travel O2 prescription;Verbalizes importance of monitoring SPO2 with pulse oximeter and return demonstration;Maintenance of O2 saturations>88%;Exhibits proper breathing techniques, such as pursed lip breathing or other method taught during program session;Compliance with respiratory medication;Demonstrates proper use of MDI's Exhibits compliance with exercise, home and travel O2 prescription;Verbalizes importance of monitoring SPO2 with pulse oximeter and return demonstration;Maintenance of O2 saturations>88%;Exhibits proper breathing techniques, such as pursed lip breathing or other method taught during program session;Compliance with respiratory medication;Demonstrates proper use of MDI's Exhibits compliance with exercise, home and travel O2 prescription;Verbalizes importance of monitoring SPO2 with pulse oximeter and return demonstration;Maintenance of O2 saturations>88%;Exhibits proper breathing techniques, such as pursed lip breathing or other method taught during program session;Compliance with respiratory medication;Demonstrates proper use of MDI's     Goals/Expected Outcomes compliance compliance and understanding of oxygen saturation and pursed lip breathing compliance and understanding of oxygen saturation and pursed lip breathing            Oxygen Discharge (Final Oxygen Re-Evaluation):  Oxygen Re-Evaluation - 11/04/19 1609      Program Oxygen Prescription   Program Oxygen Prescription None      Home Oxygen   Home Oxygen Device None    Sleep Oxygen Prescription None    Home Exercise Oxygen Prescription None    Home Resting Oxygen Prescription None    Compliance with Home Oxygen Use Yes      Goals/Expected Outcomes   Short Term Goals To learn and exhibit compliance with exercise, home and travel O2 prescription;To learn and understand importance of monitoring SPO2 with pulse oximeter and demonstrate accurate use of the pulse oximeter.;To learn and understand importance of maintaining  oxygen saturations>88%;To learn and demonstrate proper pursed lip breathing techniques or other breathing techniques.;To learn and demonstrate proper use of respiratory medications    Long  Term Goals Exhibits compliance with exercise, home and travel O2 prescription;Verbalizes importance of monitoring SPO2 with pulse oximeter and return demonstration;Maintenance of O2 saturations>88%;Exhibits proper breathing techniques, such as pursed lip breathing or other method taught during program session;Compliance with respiratory medication;Demonstrates proper use of MDI's    Goals/Expected Outcomes compliance and understanding of oxygen saturation and pursed lip breathing  Initial Exercise Prescription:  Initial Exercise Prescription - 08/28/19 1500      Date of Initial Exercise RX and Referring Provider   Date 08/28/19    Referring Provider [name], [name], MD      Recumbant Bike   Level 2    Watts 20    Minutes 15    METs 2.76      Track   Laps 13    Minutes 15    METs 2.51      Prescription Details   Frequency (times per week) 2    Duration Progress to 30 minutes of continuous aerobic without signs/symptoms of physical distress      Intensity   THRR 40-80% of Max Heartrate 60-121    Ratings of Perceived Exertion 11-13    Perceived Dyspnea 0-4      Progression   Progression Continue to progress workloads to maintain intensity without signs/symptoms of physical distress.      Resistance Training   Training Prescription No           Perform Capillary Blood Glucose checks as needed.  Exercise Prescription Changes:  Exercise Prescription Changes    Row Name 09/17/19 1400 10/01/19 1500 10/15/19 1500 10/29/19 1400 11/05/19 1600     Response to Exercise   Blood Pressure (Admit) 138/76 140/78 142/74 138/82 140/70   Blood Pressure (Exercise) 146/80 146/70 162/80 164/80 --   Blood Pressure (Exit) 150/85 142/78 134/78 140/80 118/58   Heart Rate (Admit) 84 bpm 85  bpm 77 bpm 72 bpm 83 bpm   Heart Rate (Exercise) 105 bpm 117 bpm 112 bpm 109 bpm 116 bpm   Heart Rate (Exit) 98 bpm 97 bpm 92 bpm 83 bpm 94 bpm   Oxygen Saturation (Admit) 98 % 96 % 97 % 97 % 96 %   Oxygen Saturation (Exercise) 92 % 93 % 94 % 94 % 94 %   Oxygen Saturation (Exit) 96 % 97 % 97 % 97 % 96 %   Rating of Perceived Exertion (Exercise) 13 12 13 13 13    Perceived Dyspnea (Exercise) 2 2 2 2 2    Duration Continue with 30 min of aerobic exercise without signs/symptoms of physical distress. Continue with 30 min of aerobic exercise without signs/symptoms of physical distress. Continue with 30 min of aerobic exercise without signs/symptoms of physical distress. Continue with 30 min of aerobic exercise without signs/symptoms of physical distress. Continue with 30 min of aerobic exercise without signs/symptoms of physical distress.   Intensity THRR unchanged THRR unchanged THRR unchanged THRR unchanged THRR unchanged     Progression   Progression Continue to progress workloads to maintain intensity without signs/symptoms of physical distress. Continue to progress workloads to maintain intensity without signs/symptoms of physical distress. Continue to progress workloads to maintain intensity without signs/symptoms of physical distress. Continue to progress workloads to maintain intensity without signs/symptoms of physical distress. Continue to progress workloads to maintain intensity without signs/symptoms of physical distress.   Average METs 2.3 2.8 3 -- 2.8     Resistance Training   Training Prescription No  On weight restrictions Yes Yes Yes Yes   Weight -- 3lbs 3lbs 5 lbs 5lbs   Reps -- 10-15 10-15 10-15 10-15   Time -- 10 Minutes 10 Minutes 10 Minutes 10 Minutes     Recumbant Bike   Level 2 3 3 3 3    Watts 20 20 20  -- 20   Minutes 15 15 15 15 15    METs 2.3 2.8 2.8  2.5 2.6     Track   Laps 18 15 15 16 17    Minutes 15 15 15 15 15    METs 3.09 -- 2.74 2.86 2.97     Home Exercise Plan    Plans to continue exercise at Home (comment) -- -- -- --   Frequency Add 3 additional days to program exercise sessions. -- -- -- --   Initial Home Exercises Provided 09/17/19 -- -- -- --          Exercise Comments:  Exercise Comments    Row Name 09/05/19 1507 09/17/19 1439 09/18/19 1454 11/04/19 1555     Exercise Comments Pt completed his first day of exercise in pulmonary rehab today. He tolerated exercise well, but experience muscle weakness in his legs while doing squats. He was fatigued at the end of his workout. Otherwise he tolerated his exercise session well. home exercise reviewed Pt walking 30 minutes  mostly 7 days a week.  Pt is doing very well and is off to a wonderful start with the completion of 4 exercose sessions. Pt continues to exercise most days of the week outside of pulmonary rehab. He continues to make progressions in rehab and only has 2 more sessions before he completes the program. We will follow up about his home exercise prescription and suggest progressions as able.           Exercise Goals and Review:  Exercise Goals    Row Name 08/28/19 1130 09/17/19 1457           Exercise Goals   Increase Physical Activity Yes Yes      Intervention Provide advice, education, support and counseling about physical activity/exercise needs.;Develop an individualized exercise prescription for aerobic and resistive training based on initial evaluation findings, risk stratification, comorbidities and participant's personal goals. Provide advice, education, support and counseling about physical activity/exercise needs.;Develop an individualized exercise prescription for aerobic and resistive training based on initial evaluation findings, risk stratification, comorbidities and participant's personal goals.      Expected Outcomes Short Term: Attend rehab on a regular basis to increase amount of physical activity.;Long Term: Exercising regularly at least 3-5 days a week.;Long  Term: Add in home exercise to make exercise part of routine and to increase amount of physical activity. Short Term: Attend rehab on a regular basis to increase amount of physical activity.;Long Term: Add in home exercise to make exercise part of routine and to increase amount of physical activity.;Long Term: Exercising regularly at least 3-5 days a week.      Increase Strength and Stamina Yes Yes      Intervention Provide advice, education, support and counseling about physical activity/exercise needs.;Develop an individualized exercise prescription for aerobic and resistive training based on initial evaluation findings, risk stratification, comorbidities and participant's personal goals. Provide advice, education, support and counseling about physical activity/exercise needs.;Develop an individualized exercise prescription for aerobic and resistive training based on initial evaluation findings, risk stratification, comorbidities and participant's personal goals.      Expected Outcomes Short Term: Increase workloads from initial exercise prescription for resistance, speed, and METs.;Short Term: Perform resistance training exercises routinely during rehab and add in resistance training at home;Long Term: Improve cardiorespiratory fitness, muscular endurance and strength as measured by increased METs and functional capacity (6MWT) Short Term: Increase workloads from initial exercise prescription for resistance, speed, and METs.;Short Term: Perform resistance training exercises routinely during rehab and add in resistance training at home;Long Term: Improve cardiorespiratory fitness, muscular endurance and strength  as measured by increased METs and functional capacity (6MWT)      Able to understand and use rate of perceived exertion (RPE) scale Yes Yes      Intervention Provide education and explanation on how to use RPE scale Provide education and explanation on how to use RPE scale      Expected Outcomes Short  Term: Able to use RPE daily in rehab to express subjective intensity level;Long Term:  Able to use RPE to guide intensity level when exercising independently Short Term: Able to use RPE daily in rehab to express subjective intensity level;Long Term:  Able to use RPE to guide intensity level when exercising independently      Able to understand and use Dyspnea scale Yes Yes      Intervention Provide education and explanation on how to use Dyspnea scale Provide education and explanation on how to use Dyspnea scale      Expected Outcomes Short Term: Able to use Dyspnea scale daily in rehab to express subjective sense of shortness of breath during exertion;Long Term: Able to use Dyspnea scale to guide intensity level when exercising independently Short Term: Able to use Dyspnea scale daily in rehab to express subjective sense of shortness of breath during exertion;Long Term: Able to use Dyspnea scale to guide intensity level when exercising independently      Knowledge and understanding of Target Heart Rate Range (THRR) Yes Yes      Intervention Provide education and explanation of THRR including how the numbers were predicted and where they are located for reference Provide education and explanation of THRR including how the numbers were predicted and where they are located for reference      Expected Outcomes Short Term: Able to state/look up THRR;Long Term: Able to use THRR to govern intensity when exercising independently;Short Term: Able to use daily as guideline for intensity in rehab Short Term: Able to state/look up THRR;Long Term: Able to use THRR to govern intensity when exercising independently;Short Term: Able to use daily as guideline for intensity in rehab      Understanding of Exercise Prescription Yes Yes      Intervention Provide education, explanation, and written materials on patient's individual exercise prescription Provide education, explanation, and written materials on patient's  individual exercise prescription      Expected Outcomes Short Term: Able to explain program exercise prescription;Long Term: Able to explain home exercise prescription to exercise independently Short Term: Able to explain program exercise prescription;Long Term: Able to explain home exercise prescription to exercise independently             Exercise Goals Re-Evaluation :  Exercise Goals Re-Evaluation    Row Name 09/17/19 1454 10/17/19 1636 11/04/19 1549         Exercise Goal Re-Evaluation   Exercise Goals Review Increase Physical Activity;Increase Strength and Stamina;Able to understand and use rate of perceived exertion (RPE) scale;Able to understand and use Dyspnea scale;Knowledge and understanding of Target Heart Rate Range (THRR);Understanding of Exercise Prescription Increase Physical Activity;Increase Strength and Stamina;Able to understand and use rate of perceived exertion (RPE) scale;Able to understand and use Dyspnea scale;Knowledge and understanding of Target Heart Rate Range (THRR);Able to check pulse independently;Understanding of Exercise Prescription;Improve claudication pain tolerance and improve walking ability Increase Physical Activity;Increase Strength and Stamina;Able to understand and use rate of perceived exertion (RPE) scale;Able to understand and use Dyspnea scale;Knowledge and understanding of Target Heart Rate Range (THRR);Able to check pulse independently;Understanding of Exercise Prescription  Comments pt has completed 4 exercise sessions with pulmonary rehab and has been tolerating exercise well. He is positive about exercising and incorporates walking most days of the week outside of rehab. He is currently working at 3 METs while walking the track and 2.3 METs on the Recumbent bike. We will continue to monitor and progress as he is able. Pt has completed 12 exercise sessions and is making good progression. He has not had any complaints or concerns while exercising in  pulmonary rehab. He also continuous to exercise outside of rehab at least 3 days per week. He is exercising at 2.7 METs on the Recumbent bike and 2.86 METs walking the track. We will continue to monitor and progress as able Pt has completed 15 exercise sessions and continues to progress. He also exercises outside of rehab at least 3 days per week. He is currently exercising at 2.8 METS on the Bike and 2.97 METS on the track     Expected Outcomes Through exercise at pulmonary rehab and home the patient will decrease shortness of breath with daily activities and feel confident in carrying out an exercise prescription at home Through exercise at pulmonary rehab and home the patient will decrease shortness of breath with daily activities and feel confident in carrying out an exercise prescription at home Through exercise at pulmonary rehab and home the patient will decrease shortness of breath with daily activities and feel confident in carrying out an exercise prescription at home            Discharge Exercise Prescription (Final Exercise Prescription Changes):  Exercise Prescription Changes - 11/05/19 1600      Response to Exercise   Blood Pressure (Admit) 140/70    Blood Pressure (Exit) 118/58    Heart Rate (Admit) 83 bpm    Heart Rate (Exercise) 116 bpm    Heart Rate (Exit) 94 bpm    Oxygen Saturation (Admit) 96 %    Oxygen Saturation (Exercise) 94 %    Oxygen Saturation (Exit) 96 %    Rating of Perceived Exertion (Exercise) 13    Perceived Dyspnea (Exercise) 2    Duration Continue with 30 min of aerobic exercise without signs/symptoms of physical distress.    Intensity THRR unchanged      Progression   Progression Continue to progress workloads to maintain intensity without signs/symptoms of physical distress.    Average METs 2.8      Resistance Training   Training Prescription Yes    Weight 5lbs    Reps 10-15    Time 10 Minutes      Recumbant Bike   Level 3    Watts 20     Minutes 15    METs 2.6      Track   Laps 17    Minutes 15    METs 2.97           Nutrition:  Target Goals: Understanding of nutrition guidelines, daily intake of sodium <158m, cholesterol <2014m calories 30% from fat and 7% or less from saturated fats, daily to have 5 or more servings of fruits and vegetables.  Biometrics:  Pre Biometrics - 08/28/19 1100      Pre Biometrics   Height 5' 9"  (1.753 m)    Weight 82.5 kg    BMI (Calculated) 26.85    Grip Strength 24 kg            Nutrition Therapy Plan and Nutrition Goals:  Nutrition Therapy & Goals - 09/10/19 1437  Nutrition Therapy   Diet High cal/high protein      Personal Nutrition Goals   Nutrition Goal Pt to identify food quantities necessary to achieve weight gain 0.25-1 lb per week during time in pulmonary rehab    Personal Goal #2 Pt to incorporate minimum of 1 protein shake daily and 2 servings nuts/trail mix daily      Intervention Plan   Intervention Prescribe, educate and counsel regarding individualized specific dietary modifications aiming towards targeted core components such as weight, hypertension, lipid management, diabetes, heart failure and other comorbidities.;Nutrition handout(s) given to patient.    Expected Outcomes Short Term Goal: A plan has been developed with personal nutrition goals set during dietitian appointment.           Nutrition Assessments:   Nutrition Goals Re-Evaluation:  Nutrition Goals Re-Evaluation    [place] Name 09/10/19 1438 10/15/19 0757 11/06/19 1142         Goals   Current Weight 181 lb (82.1 kg) 183 lb 13.8 oz (83.4 kg) 186 lb 11.7 oz (84.7 kg)     Nutrition Goal Pt to identify food quantities necessary to achieve weight gain 0.25-1 lb per week during time in pulmonary rehab -- Pt to identify food quantities necessary to achieve weight gain 0.25-1 lb per week during time in pulmonary rehab       Personal Goal #2 Re-Evaluation   Personal Goal #2 Pt to  incorporate minimum of 1 protein shake daily and 2 servings nuts/trail mix daily -- Pt to incorporate minimum of 1 protein shake daily and 2 servings nuts/trail mix daily            Nutrition Goals Discharge (Final Nutrition Goals Re-Evaluation):  Nutrition Goals Re-Evaluation - 11/06/19 1142      Goals   Current Weight 186 lb 11.7 oz (84.7 kg)    Nutrition Goal Pt to identify food quantities necessary to achieve weight gain 0.25-1 lb per week during time in pulmonary rehab      Personal Goal #2 Re-Evaluation   Personal Goal #2 Pt to incorporate minimum of 1 protein shake daily and 2 servings nuts/trail mix daily           Psychosocial: Target Goals: Acknowledge presence or absence of significant depression and/or stress, maximize coping skills, provide positive support system. Participant is able to verbalize types and ability to use techniques and skills needed for reducing stress and depression.  Initial Review & Psychosocial Screening:  Initial Psych Review & Screening - 08/28/19 1143      Initial Review   Current issues with None Identified      Family Dynamics   Good Support System? Yes      Barriers   Psychosocial barriers to participate in program There are no identifiable barriers or psychosocial needs.      Screening Interventions   Interventions Encouraged to exercise           Quality of Life Scores:  Scores of 19 and below usually indicate a poorer quality of life in these areas.  A difference of  2-3 points is a clinically meaningful difference.  A difference of 2-3 points in the total score of the Quality of Life Index has been associated with significant improvement in overall quality of life, self-image, physical symptoms, and general health in studies assessing change in quality of life.  PHQ-9: Recent Review Flowsheet Data    Depression screen [facility] 2/9 08/28/2019 10/18/2018 10/18/2018 01/26/2018 04/05/2017   Decreased Interest 0 -  0 0 2   Down,  Depressed, Hopeless 0 0 0 0 0   PHQ - 2 Score 0 0 0 0 2   Altered sleeping 0 0 - 1 0   Tired, decreased energy 0 0 - 1 1   Change in appetite 0 0 - 0 1   Feeling bad or failure about yourself  0 0 - 0 0   Trouble concentrating 0 0 - 0 0   Moving slowly or fidgety/restless 0 0 - 0 0   Suicidal thoughts 0 0 - 0 0   PHQ-9 Score 0 0 - 2 4   Difficult doing work/chores - Not difficult at all - Not difficult at all Not difficult at all     Interpretation of Total Score  Total Score Depression Severity:  1-4 = Minimal depression, 5-9 = Mild depression, 10-14 = Moderate depression, 15-19 = Moderately severe depression, 20-27 = Severe depression   Psychosocial Evaluation and Intervention:  Psychosocial Evaluation - 11/05/19 1545      Psychosocial Evaluation & Interventions   Interventions Encouraged to exercise with the program and follow exercise prescription    Comments No barriers or concerns were identified at this time.    Expected Outcomes For Ishaan to continue to be free of barriers or psychosocial concerns while participating in pulmonary rehab.    Continue Psychosocial Services  No Follow up required           Psychosocial Re-Evaluation:  Psychosocial Re-Evaluation    [place] Name 08/28/19 1144 09/18/19 1456 09/18/19 1502 10/16/19 2312 11/05/19 1545     Psychosocial Re-Evaluation   Current issues with None Identified None Identified -- -- None Identified   Comments -- Pt with supportive wife.  Pt is pleased with the progress he has made and how well he is doing both physically and mentally Pt with supportive wife.  Pt is pleased with the progress he has made and how well he is doing both physically and mentally.  Pt reports some sleep concerns but this is associated with back and incisional discomfort. Deaunte has supportive wife.  Pt is pleased with the progress he has made and how well he is doing both physically and mentally.  Pavan has increased his laps on the track.  Pt reports  some sleep concerns but this is associated with back and incisional discomfort however this is improving. Haleem has supportive wife.  Pt is pleased with the progress and will graduate this week.   Expected Outcomes -- That Smiley continues to have no barriers or psychosocial concerns while in pulmonary rehab with countinued positve healthy coping skills and outlook. -- That Betty continues to have no barriers or psychosocial concerns while in pulmonary rehab with countinued positve healthy coping skills and outlook. That Estel continues to have no barriers or psychosocial concerns while in pulmonary rehab with countinued positve healthy coping skills and outlook.   Interventions -- Encouraged to attend Pulmonary Rehabilitation for the exercise -- Encouraged to attend Pulmonary Rehabilitation for the exercise;Relaxation education;Stress management education Encouraged to attend Pulmonary Rehabilitation for the exercise   Continue Psychosocial Services  -- Follow up required by staff  Will continue to monitor and reasses pt mental well being. -- -- No Follow up required          Psychosocial Discharge (Final Psychosocial Re-Evaluation):  Psychosocial Re-Evaluation - 11/05/19 1545      Psychosocial Re-Evaluation   Current issues with None Identified    Comments Rojelio has supportive wife.  Pt is pleased with the progress and will graduate this week.    Expected Outcomes That Jeremaine continues to have no barriers or psychosocial concerns while in pulmonary rehab with countinued positve healthy coping skills and outlook.    Interventions Encouraged to attend Pulmonary Rehabilitation for the exercise    Continue Psychosocial Services  No Follow up required           Education: Education Goals: Education classes will be provided on a weekly basis, covering required topics. Participant will state understanding/return demonstration of topics presented.  Learning Barriers/Preferences:  Learning  Barriers/Preferences - 08/28/19 1145      Learning Barriers/Preferences   Learning Barriers None    Learning Preferences Audio;Group Instruction;Individual Instruction;Pictoral;Skilled Demonstration;Verbal Instruction;Video;Written Material           Education Topics: Risk Factor Reduction:  -Group instruction that is supported by a PowerPoint presentation. Instructor discusses the definition of a risk factor, different risk factors for pulmonary disease, and how the heart and lungs work together.     PULMONARY REHAB OTHER RESPIRATORY from 01/29/2019 in [place]  Date 01/08/19  Educator --  [Handout]      Nutrition for Pulmonary Patient:  -Group instruction provided by PowerPoint slides, verbal discussion, and written materials to support subject matter. The instructor gives an explanation and review of healthy diet recommendations, which includes a discussion on weight management, recommendations for fruit and vegetable consumption, as well as protein, fluid, caffeine, fiber, sodium, sugar, and alcohol. Tips for eating when patients are short of breath are discussed.   PULMONARY REHAB OTHER RESPIRATORY from 10/15/2019 in [place]  Date 10/03/19  Educator Handout  Instruction Review Code 2- Demonstrated Understanding      Pursed Lip Breathing:  -Group instruction that is supported by demonstration and informational handouts. Instructor discusses the benefits of pursed lip and diaphragmatic breathing and detailed demonstration on how to preform both.     Oxygen Safety:  -Group instruction provided by PowerPoint, verbal discussion, and written material to support subject matter. There is an overview of "What is Oxygen" and "Why do we need it".  Instructor also reviews how to create a safe environment for oxygen use, the importance of using oxygen as prescribed, and the risks of noncompliance. There is a brief discussion  on traveling with oxygen and resources the patient may utilize.   Oxygen Equipment:  -Group instruction provided by [facility] Staff utilizing handouts, written materials, and equipment demonstrations.   Signs and Symptoms:  -Group instruction provided by written material and verbal discussion to support subject matter. Warning signs and symptoms of infection, stroke, and heart attack are reviewed and when to call the physician/911 reinforced. Tips for preventing the spread of infection discussed.   Advanced Directives:  -Group instruction provided by verbal instruction and written material to support subject matter. Instructor reviews Advanced Directive laws and proper instruction for filling out document.   Pulmonary Video:  -Group video education that reviews the importance of medication and oxygen compliance, exercise, good nutrition, pulmonary hygiene, and pursed lip and diaphragmatic breathing for the pulmonary patient.   Exercise for the Pulmonary Patient:  -Group instruction that is supported by a PowerPoint presentation. Instructor discusses benefits of exercise, core components of exercise, frequency, duration, and intensity of an exercise routine, importance of utilizing pulse oximetry during exercise, safety while exercising, and options of places to exercise outside of rehab.     Pulmonary Medications:  -Verbally interactive  group education provided by instructor with focus on inhaled medications and proper administration.   PULMONARY REHAB OTHER RESPIRATORY from 09/19/2019 in [place]  Date 09/19/19  Educator handout  Instruction Review Code 2- Demonstrated Understanding      Anatomy and Physiology of the Respiratory System and Intimacy:  -Group instruction provided by PowerPoint, verbal discussion, and written material to support subject matter. Instructor reviews respiratory cycle and anatomical components of the respiratory system  and their functions. Instructor also reviews differences in obstructive and restrictive respiratory diseases with examples of each. Intimacy, Sex, and Sexuality differences are reviewed with a discussion on how relationships can change when diagnosed with pulmonary disease. Common sexual concerns are reviewed.   MD DAY -A group question and answer session with a medical doctor that allows participants to ask questions that relate to their pulmonary disease state.   OTHER EDUCATION -Group or individual verbal, written, or video instructions that support the educational goals of the pulmonary rehab program.   PULMONARY REHAB OTHER RESPIRATORY from 10/15/2019 in [place]  Date 10/15/19  Educator Handout  Instruction Review Code 2- Demonstrated Understanding      Holiday Eating Survival Tips:  -Group instruction provided by PowerPoint slides, verbal discussion, and written materials to support subject matter. The instructor gives patients tips, tricks, and techniques to help them not only survive but enjoy the holidays despite the onslaught of food that accompanies the holidays.   PULMONARY REHAB OTHER RESPIRATORY from 01/29/2019 in [place]  Date 12/25/18  Educator 01/08/19  [Handout]      Knowledge Questionnaire Score:  Knowledge Questionnaire Score - 11/05/19 1517      Knowledge Questionnaire Score   Post Score 16/18           Core Components/Risk Factors/Patient Goals at Admission:  Personal Goals and Risk Factors at Admission - 08/28/19 1145      Core Components/Risk Factors/Patient Goals on Admission   Improve shortness of breath with ADL's Yes    Intervention Provide education, individualized exercise plan and daily activity instruction to help decrease symptoms of SOB with activities of daily living.    Expected Outcomes Short Term: Improve cardiorespiratory fitness to achieve a reduction of symptoms when  performing ADLs;Long Term: Be able to perform more ADLs without symptoms or delay the onset of symptoms           Core Components/Risk Factors/Patient Goals Review:   Goals and Risk Factor Review    Row Name 08/28/19 1145 09/18/19 1459 10/16/19 2316 11/05/19 1547       Core Components/Risk Factors/Patient Goals Review   Personal Goals Review Increase knowledge of respiratory medications and ability to use respiratory devices properly.;Improve shortness of breath with ADL's;Develop more efficient breathing techniques such as purse lipped breathing and diaphragmatic breathing and practicing self-pacing with activity. Increase knowledge of respiratory medications and ability to use respiratory devices properly.;Improve shortness of breath with ADL's;Develop more efficient breathing techniques such as purse lipped breathing and diaphragmatic breathing and practicing self-pacing with activity. -- Increase knowledge of respiratory medications and ability to use respiratory devices properly.;Improve shortness of breath with ADL's;Develop more efficient breathing techniques such as purse lipped breathing and diaphragmatic breathing and practicing self-pacing with activity.    Review -- Johnpaul is off to a wonderful start with the completion of 4 exercise sessions.  Pt has pulse ox and understands how to use it as well as acceptable parameters.  Pt continues to use the incentive spirometer he was given post operatively.  Pt able to demonstrate efficient breathing techniques such as PLB.  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 Ruford is doing well and has completed 11  exercise sessions.  Pt has pulse ox and understands how to use it as well as acceptable parameters.  Pt continues to use the incentive spirometer he was given post operatively.  Pt able to demonstrate efficient breathing techniques such as PLB.  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 Pt has  increased his MET level with increasing workloads. Trevaughn is doing well and has completed 16  exercise sessions.  Pt doing well with exercise and averages 17 exercise sessions  and level 3 on the recumbent bike.  Pt independently uses breathing techniques maintais level 2 on the dyspnea scale.    Expected Outcomes -- -- See admission goals. See admission goals.           Core Components/Risk Factors/Patient Goals at Discharge (Final Review):   Goals and Risk Factor Review - 11/05/19 1547      Core Components/Risk Factors/Patient Goals Review   Personal Goals Review Increase knowledge of respiratory medications and ability to use respiratory devices properly.;Improve shortness of breath with ADL's;Develop more efficient breathing techniques such as purse lipped breathing and diaphragmatic breathing and practicing self-pacing with activity.    Review Maysen is doing well and has completed 16  exercise sessions.  Pt doing well with exercise and averages 17 exercise sessions  and level 3 on the recumbent bike.  Pt independently uses breathing techniques maintais level 2 on the dyspnea scale.    Expected Outcomes See admission goals.           ITP Comments:   Comments:  Haiden has completed 16 exercise sessions in Pulmonary rehab. Pt maintains good attendance and consistent home exercise. Pt will graduate today. Cherre Huger, BSN Cardiac and Training and development officer

## 2019-11-11 ENCOUNTER — Other Ambulatory Visit (HOSPITAL_COMMUNITY): Payer: Self-pay | Admitting: Internal Medicine

## 2019-11-11 MED FILL — ZORTRESS 1 MG TABS: 1 | 30 days supply | Qty: 120 | Fill #0

## 2019-11-13 ENCOUNTER — Ambulatory Visit: Payer: 59

## 2019-11-18 DIAGNOSIS — Z944 Liver transplant status: Secondary | ICD-10-CM | POA: Diagnosis not present

## 2019-11-22 ENCOUNTER — Other Ambulatory Visit: Payer: Self-pay

## 2019-11-23 MED FILL — METOPROLOL TARTRATE 25 MG T: 25 | 90 days supply | Qty: 270 | Fill #0

## 2019-11-25 ENCOUNTER — Other Ambulatory Visit: Payer: Self-pay

## 2019-11-25 ENCOUNTER — Ambulatory Visit: Payer: 59 | Admitting: Family Medicine

## 2019-11-25 ENCOUNTER — Encounter: Payer: Self-pay | Admitting: Family Medicine

## 2019-11-25 VITALS — BP 162/90 | HR 85 | Temp 97.6°F | Resp 16 | Wt 192.4 lb

## 2019-11-25 DIAGNOSIS — Z8719 Personal history of other diseases of the digestive system: Secondary | ICD-10-CM | POA: Diagnosis not present

## 2019-11-25 DIAGNOSIS — N183 Chronic kidney disease, stage 3 unspecified: Secondary | ICD-10-CM | POA: Diagnosis not present

## 2019-11-25 DIAGNOSIS — Z23 Encounter for immunization: Secondary | ICD-10-CM

## 2019-11-25 DIAGNOSIS — Z944 Liver transplant status: Secondary | ICD-10-CM

## 2019-11-25 DIAGNOSIS — K7681 Hepatopulmonary syndrome: Secondary | ICD-10-CM | POA: Diagnosis not present

## 2019-11-25 DIAGNOSIS — Z0000 Encounter for general adult medical examination without abnormal findings: Secondary | ICD-10-CM

## 2019-11-25 DIAGNOSIS — I10 Essential (primary) hypertension: Secondary | ICD-10-CM

## 2019-11-25 DIAGNOSIS — Z125 Encounter for screening for malignant neoplasm of prostate: Secondary | ICD-10-CM

## 2019-11-25 DIAGNOSIS — E119 Type 2 diabetes mellitus without complications: Secondary | ICD-10-CM

## 2019-11-25 MED ORDER — METOPROLOL TARTRATE 25 MG PO TABS
ORAL_TABLET | ORAL | 0 refills | Status: DC
Start: 2019-11-25 — End: 2020-04-24

## 2019-11-25 MED ORDER — PREDNISONE 5 MG PO TABS
ORAL_TABLET | ORAL | 0 refills | Status: DC
Start: 2019-11-25 — End: 2020-09-23

## 2019-11-25 NOTE — Progress Notes (Signed)
Office Note 11/25/2019  CC:  Chief Complaint  Patient presents with  . wellness visit    HPI:  William [name] is a 69 y.o. White male who is here for annual health maintenance exam plus f/u HTN, CRI III, DM 2. 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 He has history of chronic GI bleed from unknown source and has been maintained on sandostatin, lysteda, and IV iron.  He is feeling good. [place] [facility] team wanted me to check pt's PSA.  Has been eating better, put on some wt, just finished rehab. Breathing feels good, oxygen sat always mid to upper 90s even when exercising. [facility] specialists have now taken him off of mycophenolate, aldactone, lysteda, and sandostatin. Decreased his prednisone to 1/2 of 47m tab qd.  He continues on tacrolimus.  HTN: home monitoring 130/80.  Wife keeping a close eye on it (she is a nMarine scientist.  DM:  Eats a good, healthy diet.  He is active, mild intensity exercise.  CRI: sCr 1.4-1.5.  Drinks about 55 oz clear fluids per day. He avoids NSAIDs.  No further sign of bleeding at all.  NO recent iron infusion. He is off lysteda and sandostatin.   Past Medical History:  Diagnosis Date  . AAA (abdominal aortic aneurysm) ([place] 03/2014   repaired 2020  . Bilateral renal cysts    simple (03/2014 MRI)  . CAD (coronary artery disease)   . Cholelithiases 2018   asymptomatic  . Chronic diastolic heart failure ([facility] [facility]   . Diabetes mellitus with complication ([facility] 054/5625  A1c 6.8%  . 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  Improving + off oxygen as of 3 mo s/p liver transplant.  . History of blood transfusion 2018 X 4 dates   "low blood" (09/21/2016)  . [place]   [facility]  . Hyperlipemia, mixed    elevated LFTs when on statins.    . Hypertension    Cr bump 04/01/16 so I changed benicar-hct to benicar plain and added amlodipine 5 mg.  . Iron deficiency anemia 05/2016    Acute blood loss anemia: hospitalized, required transfusion x 3 U: colonoscopy and capsule study unrevealing.  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  W/u for ? hemolytic anemia to be pursued by hematologist as outpt.  [facility]'d onc do bone marrow bx as of Jan 2019  . Iron deficiency anemia due to chronic blood loss 2018/19   GI: transfusions x >20 required; multiple endoscopies and bleeding scans unrevealing. Lysteda and octreotide + monthly iron infusions as of 04/2017. Iron infusions changed to every other month as of 09/2018 hem f/u.  .Marland KitchenLiver cirrhosis secondary to NASH ([place] 05/2016   Liver transplant 07/2019  . Liver failure ([place] 2020   NASH cirrhosis  . Lung field abnormal finding on examination    Bibasilar L>R mild insp crackles-->x-ray showed mild interstitial changes/fibrosis/scarring.  Changes noted on all CXRs in 2018/2019.  Liver Transplant eval 03/2018->mild restriction on PFTs but no obstruction.  See further details in [place]"pulm fibrosis" section  . Microscopic hematuria    Eval unremarkable by Dr. DEulogio Ditch  . 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 Obesity   . Pulmonary fibrosis (HCC)    PFTs: restrictive lung dz ([facility] transplant eval 05/2018). [facility] pulm eval  felt this was likely hepatopulmonary syndrome->causing his hypoxia->low likelihood of progression (as of 10/04/18 transplant clinic f/u). Pulm rehab helping as of 2020/2021. OFF oxygen as of 10/2019 transp f/u.  [name] Spleen enlarged     Past Surgical History:  Procedure Laterality Date  . ABDOMINAL AORTIC ANEURYSM REPAIR  08/23/2018   [facility]  . CARDIAC CATHETERIZATION    . CARDIOVASCULAR STRESS TEST  01/17/2012; 05/04/16   Normal stress nuclear study x 2 (2018--Normal perfusion. LVEF 66% with normal wall motion. This is a low risk study).  . CERVICAL DISCECTOMY  1992  .  COLONOSCOPY N/A 05/27/2016   No site/explanation for blood loss found.  Erythematous mucosa in cecum and ascending colon--Cecal bx: normal.  Procedure: COLONOSCOPY;  Surgeon: Carol Ada, MD;  Location[facility] ENDOSCOPY;  Service: Endoscopy;  Laterality: N/A;  . COLONOSCOPY W/ POLYPECTOMY  09/10/2014   Polypectomy x 2: recall 5 yrs (Dr. [name]).  . CORONARY ANGIOPLASTY    . CORONARY ARTERY BYPASS GRAFT  03/2006  . CT ABDOMEN WITH CONTRAST ([place] HX)  11/2018  . CT CHEST WWO CONTAST ([place] HX)  07/11/2019   No significant change in interstitial lung disease with possible underlying hepatopulmonary syndrome, Increased ascites in the setting of cirrhosis, tiny new right basilar nodule  . DEXA  03/07/2019   T score 0.2/NORMAL  . ENTEROSCOPY N/A 07/31/2016   Procedure: ENTEROSCOPY;  Surgeon: Ladene Artist, MD;  Location: [facility] ENDOSCOPY;  Service: Endoscopy;  Laterality: N/A;  . ENTEROSCOPY N/A 12/02/2016   Procedure: ENTEROSCOPY;  Surgeon: Carol Ada, MD;  Location: [facility] ENDOSCOPY;  Service: Endoscopy;  Laterality: N/A;  . ESOPHAGOGASTRODUODENOSCOPY N/A 05/25/2016   No site/explanation for blood loss found.  Procedure: ESOPHAGOGASTRODUODENOSCOPY (EGD);  Surgeon: Juanita Craver, MD;  Location: [facility] ENDOSCOPY;  Service: Endoscopy;  Laterality: N/A;  . ESOPHAGOGASTRODUODENOSCOPY N/A 09/23/2016   Procedure: ESOPHAGOGASTRODUODENOSCOPY (EGD);  Surgeon: Carol Ada, MD;  Location: [place];  Service: Endoscopy;  Laterality: N/A;  . ESOPHAGOGASTRODUODENOSCOPY (EGD) WITH PROPOFOL N/A 08/21/2018   Procedure: ESOPHAGOGASTRODUODENOSCOPY (EGD) WITH PROPOFOL;  Surgeon: Carol Ada, MD;  Location: [facility] ENDOSCOPY;  Service: Endoscopy;  Laterality: N/A;  . GIVENS CAPSULE STUDY N/A 05/27/2016   No source identified.  Repeat 10/2016--results pending.  Procedure: GIVENS CAPSULE STUDY;  Surgeon: Carol Ada, MD;  Location: [facility] ENDOSCOPY;  Service: Endoscopy;  Laterality: N/A;  . GIVENS CAPSULE STUDY N/A 10/15/2016   Procedure:  GIVENS CAPSULE STUDY;  Surgeon: Carol Ada, MD;  Location: [facility] ENDOSCOPY;  Service: Endoscopy;  Laterality: N/A;  . GIVENS CAPSULE STUDY N/A 02/13/2017   Procedure: GIVENS CAPSULE STUDY;  Surgeon: Carol Ada, MD;  Location: [place];  Service: Endoscopy;  Laterality: N/A;  . LIVER TRANSPLANT  08/02/2019   For NASH cirrhosis. [place]  . NECK SURGERY  1991  . NM GI BLOOD LOSS  01/27/2017   NEGATIVE  . SMALL BOWEL ENTEROSCOPY  10/2016   ([facility], Dr. [name]: Double balloon enteroscopy--to the level of proximal ileum) jejunal polyps- which were removed--not bleeding & benign.  No source of bleeding was identified.  . TRANSTHORACIC ECHOCARDIOGRAM  05/25/2016; 10/2017   05/2016: EF 60%, normal wall motion, grd II DD, mild aortic stenosis, dilated aortic root and ascending aorta. 10/2017: Mild LVH; no mention made of LV function.  Moderate aorticstenosis with mean gradient 19 peak gradient 41. AVA 1.1 cm^2.  05/2018 EF 55%,4.3 cm ascend aneur, mild AS.  [name] VASECTOMY  1977    Family History  Problem Relation Age of Onset  . Hypertension Mother   .  Cancer - Other Mother        liver cancer  . Heart disease Father   . Heart attack Father   . Cancer - Lung Father   . Diabetes Father   . Liver disease Sister   . Anemia Sister   . Heart disease Sister   . Heart attack Sister   . Heart disease Brother        CABG 20 YEARS AGO  . Hypertension Brother   . Mesothelioma Brother        HALF-BROTHER  . Cancer - Other Brother        CLL  . Cancer - Lung Brother        Mets to brain  . Cancer - Lung Sister        HALF-SISTER  . Liver disease Brother   . Heart disease Brother        CABG-2012  . Emphysema Maternal Grandfather   . Cancer - Other Paternal Grandmother        Stomach  . Heart attack Paternal Grandfather     Social History   Socioeconomic History  . Marital status: Married    Spouse name: Not on file  . Number of children: Not on file  . Years of education: Not on file  .  Highest education level: Not on file  Occupational History  . Not on file  Tobacco Use  . Smoking status: Former Smoker    Types: Cigarettes  . Smokeless tobacco: Never Used  . Tobacco comment: QUIT I 1983  Vaping Use  . Vaping Use: Never used  Substance and Sexual Activity  . Alcohol use: No  . Drug use: No  . Sexual activity: Yes  Other Topics Concern  . Not on file  Social History Narrative   Married, 3 grown children, 3 GCs.   Educ: 10th grade.   Occupation: Retired Brewing technologist.   Tob: quit 1983, smoked about 20 pack-yr hx prior.   No alcohol.   Social Determinants of Health   Financial Resource Strain:   . Difficulty of Paying Living Expenses: Not on file  Food Insecurity:   . Worried About Charity fundraiser in the Last Year: Not on file  . Ran Out of Food in the Last Year: Not on file  Transportation Needs:   . Lack of Transportation (Medical): Not on file  . Lack of Transportation (Non-Medical): Not on file  Physical Activity:   . Days of Exercise per Week: Not on file  . Minutes of Exercise per Session: Not on file  Stress:   . Feeling of Stress : Not on file  Social Connections:   . Frequency of Communication with Friends and Family: Not on file  . Frequency of Social Gatherings with Friends and Family: Not on file  . Attends Religious Services: Not on file  . Active Member of Clubs or Organizations: Not on file  . Attends Archivist Meetings: Not on file  . Marital Status: Not on file  Intimate Partner Violence:   . Fear of Current or Ex-Partner: Not on file  . Emotionally Abused: Not on file  . Physically Abused: Not on file  . Sexually Abused: Not on file    Outpatient Medications Prior to Visit  Medication Sig Dispense Refill  . aspirin EC 81 MG tablet Take 81 mg by mouth daily. Swallow whole.    . Blood Glucose Monitoring Suppl (FREESTYLE LITE) DEVI     . calcium  citrate-vitamin D (CITRACAL+D) 315-200 MG-UNIT tablet Take by mouth.     . CONTOUR NEXT TEST test strip     . Everolimus 1 MG TABS Take by mouth.    . GENERLAC 10 GM/15ML SOLN Take 20 g by mouth 3 (three) times daily.    . hydrocortisone 2.5 % cream APPLY TWICE A DAY TO SCALING AREAS OF BROWS WHEN NEEDED. MAY APPLY TO SCALING INSIDE OF EARS AS WELL    . Magnesium Oxide 400 (240 Mg) MG TABS Take 1 tablet by mouth 2 (two) times daily.    . Niacinamide-Zn-Cu-Methfo-Se-Cr (NICOTINAMIDE PO)     . pantoprazole (PROTONIX) 40 MG tablet Take 1 tablet (40 mg total) by mouth 2 (two) times daily before a meal. TAKE 1 TABLET BY MOUTH TWICE DAILY BEFORE A MEAL 180 tablet 1  . SENEXON-S 8.6-50 MG tablet Take by mouth.    . tacrolimus (PROGRAF) 1 MG capsule Take 1 mg by mouth 2 (two) times daily. Take 3 tablets in am and 2 in evening    . metoprolol tartrate (LOPRESSOR) 25 MG tablet Take by mouth.    . spironolactone (ALDACTONE) 25 MG tablet TAKE 0.5 TABLET (12.5 MG TOTAL) BY MOUTH DAILY. 45 tablet 1  . mycophenolate (CELLCEPT) 250 MG capsule Take 250 mg by mouth 2 (two) times daily. 4 capsules ( total 1000 mg ) every 12 hours (Patient not taking: Reported on 11/25/2019)    . predniSONE (DELTASONE) 5 MG tablet SMARTSIG:3.5 Tablet(s) By Mouth Daily (Patient not taking: Reported on 11/25/2019)     Facility-Administered Medications Prior to Visit  Medication Dose Route Frequency Provider Last Rate Last Admin  . heparin lock flush 100 unit/mL  500 Units Intracatheter Daily PRN Truitt Merle, MD      . sodium chloride flush (NS) 0.9 % injection 10 mL  10 mL Intracatheter PRN Truitt Merle, MD        Allergies  Allergen Reactions  . Statins Other (See Comments)    Liver enzymes go up whenever on them    ROS Review of Systems  Constitutional: Negative for appetite change, chills, fatigue and fever.  HENT: Negative for congestion, dental problem, ear pain and sore throat.   Eyes: Negative for discharge, redness and visual disturbance.  Respiratory: Negative for cough, chest tightness,  shortness of breath and wheezing.   Cardiovascular: Negative for chest pain, palpitations and leg swelling.  Gastrointestinal: Negative for abdominal pain, blood in stool, diarrhea, nausea and vomiting.  Genitourinary: Negative for difficulty urinating, dysuria, flank pain, frequency, hematuria and urgency.  Musculoskeletal: Negative for arthralgias, back pain, joint swelling, myalgias and neck stiffness.  Skin: Negative for pallor and rash.  Neurological: Negative for dizziness, speech difficulty, weakness and headaches.  Hematological: Negative for adenopathy. Does not bruise/bleed easily.  Psychiatric/Behavioral: Negative for confusion and sleep disturbance. The patient is not nervous/anxious.     PE; Vitals with BMI 11/25/2019 10/29/2019 10/15/2019  Height - - -  Weight 192 lbs 6 oz 186 lbs 1 oz 183 lbs 14 oz  BMI - - -  Systolic 048 - -  Diastolic 90 - -  Pulse 85 - -   Gen: Alert, well appearing.  Patient is oriented to person, place, time, and situation. AFFECT: pleasant, lucid thought and speech. ENT: Ears: EACs clear, normal epithelium.  TMs with good light reflex and landmarks bilaterally.  Eyes: no injection, icteris, swelling, or exudate.  EOMI, PERRLA. Nose: no drainage or turbinate edema/swelling.  No injection or focal lesion.  Mouth: lips without lesion/swelling.  Oral mucosa pink and moist.  Dentition intact and without obvious caries or gingival swelling.  Oropharynx without erythema, exudate, or swelling.  Neck: supple/nontender.  No LAD, mass, or TM.  Carotid pulses 2+ bilaterally, without bruits. CV: RRR, soft systolic murmur.  No r/g.   LUNGS: CTA bilat, nonlabored resps, good aeration in all lung fields. ABD: soft, NT, ND, BS normal.  No hepatospenomegaly or mass.  No bruits. EXT: no clubbing, cyanosis, or edema.  Musculoskeletal: no joint swelling, erythema, warmth, or tenderness.  ROM of all joints intact. Skin - no sores or suspicious lesions or rashes or color  changes Rectal: pt deferred  Pertinent labs:  Lab Results  Component Value Date   TSH 1.19 05/04/2016   Lab Results  Component Value Date   WBC 5.2 10/24/2019   HGB 14.0 10/24/2019   HCT 43 10/24/2019   MCV 98.5 06/27/2019   PLT 177 10/24/2019   Lab Results  Component Value Date   IRON 57 06/27/2019   TIBC 244 06/27/2019   FERRITIN 203 06/27/2019    Lab Results  Component Value Date   CREATININE 1.6 (A) 10/24/2019   BUN 16 10/24/2019   NA 132 (A) 10/24/2019   K 4.2 10/24/2019   CL 101 10/24/2019   CO2 23 (A) 10/24/2019   Lab Results  Component Value Date   ALT 13 10/24/2019   AST 12 (A) 10/24/2019   ALKPHOS 50 10/24/2019   BILITOT 8.1 (HH) 05/02/2019   Lab Results  Component Value Date   CHOL 162 01/25/2017   Lab Results  Component Value Date   HDL 47 01/25/2017   Lab Results  Component Value Date   LDLCALC 100 (H) 01/25/2017   Lab Results  Component Value Date   TRIG 76 01/25/2017   Lab Results  Component Value Date   CHOLHDL 3.4 01/25/2017   Lab Results  Component Value Date   PSA 0.11 05/15/2018   PSA 0.31 06/29/2016   Lab Results  Component Value Date   HGBA1C 4.7 05/15/2018   ASSESSMENT AND PLAN:   1) HTN: home monitoring shows excellent control. Continue lopressor 53m, 1 qam and 1/2 qpm.  2) DM:  Diet.  Glucoses have remained good throughout this entire 2 yr period of medical difficulties for him. A1c today.  3) CRI III: monitor sCr today, lytes.  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  Will check CMET today.   5) Chronic GIB: seems to have resolved since liver transplant: Hb stable (14 on 10/24/19), he is off sandostatin and lysteda, not requiring IV iron. No site for this bleeding was ever located despite extensive endoscopic eval.  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 He is s/p pulm rehab.  [facility] to make sure they monitor this. No oxygen needed.  7) Health maintenance exam: Reviewed age and gender appropriate health maintenance issues (prudent diet, regular exercise, health risks of tobacco and excessive alcohol, use of seatbelts, fire alarms in home, use of sunscreen).  Also reviewed age and gender appropriate health screening as well as vaccine recommendations. Vaccines:flu vaccine today.  He is otherwise all UTD.  Shingrix deferred until (possibly) next year. Labs: CMET, A1c, PSA.  Prostate ca screening: PSA today. Colon ca screening: last colonoscopy was 2018 in the context of GIB: no polyps.  Anticipate repeat 2023.  An After Visit Summary  was printed and given to the patient.  FOLLOW UP:  Return in about 6 months (around 05/25/2020) for routine chronic illness f/u.  Signed:  Crissie Sickles, MD           11/25/2019

## 2019-11-26 ENCOUNTER — Encounter: Payer: Self-pay | Admitting: Family Medicine

## 2019-11-26 ENCOUNTER — Ambulatory Visit: Payer: 59

## 2019-11-27 ENCOUNTER — Other Ambulatory Visit: Payer: Self-pay

## 2019-11-27 ENCOUNTER — Ambulatory Visit (INDEPENDENT_AMBULATORY_CARE_PROVIDER_SITE_OTHER): Payer: 59

## 2019-11-27 DIAGNOSIS — Z944 Liver transplant status: Secondary | ICD-10-CM

## 2019-11-27 DIAGNOSIS — R972 Elevated prostate specific antigen [PSA]: Secondary | ICD-10-CM

## 2019-11-27 DIAGNOSIS — N183 Chronic kidney disease, stage 3 unspecified: Secondary | ICD-10-CM | POA: Diagnosis not present

## 2019-11-27 DIAGNOSIS — E119 Type 2 diabetes mellitus without complications: Secondary | ICD-10-CM

## 2019-11-27 DIAGNOSIS — Z8719 Personal history of other diseases of the digestive system: Secondary | ICD-10-CM

## 2019-11-27 DIAGNOSIS — Z125 Encounter for screening for malignant neoplasm of prostate: Secondary | ICD-10-CM | POA: Diagnosis not present

## 2019-11-27 LAB — COMPREHENSIVE METABOLIC PANEL
ALT: 11 U/L (ref 0–53)
AST: 18 U/L (ref 0–37)
Albumin: 4 g/dL (ref 3.5–5.2)
Alkaline Phosphatase: 65 U/L (ref 39–117)
BUN: 12 mg/dL (ref 6–23)
CO2: 24 mEq/L (ref 19–32)
Calcium: 8.9 mg/dL (ref 8.4–10.5)
Chloride: 96 mEq/L (ref 96–112)
Creatinine, Ser: 1.07 mg/dL (ref 0.40–1.50)
GFR: 69.85 mL/min (ref >60.00)
Glucose, Bld: 131 mg/dL — ABNORMAL HIGH (ref 70–99)
Potassium: 4.1 mEq/L (ref 3.5–5.1)
Sodium: 129 mEq/L — ABNORMAL LOW (ref 135–145)
Total Bilirubin: 1 mg/dL (ref 0.2–1.2)
Total Protein: 5.9 g/dL — ABNORMAL LOW (ref 6.0–8.3)

## 2019-11-27 LAB — PSA, MEDICARE: PSA: 4.27 ng/ml — ABNORMAL HIGH (ref 0.10–4.00)

## 2019-11-27 LAB — HEMOGLOBIN A1C: Hgb A1c MFr Bld: 5.4 % (ref 4.6–6.5)

## 2019-11-28 ENCOUNTER — Encounter: Payer: Self-pay | Admitting: Family Medicine

## 2019-11-28 ENCOUNTER — Telehealth: Payer: Self-pay | Admitting: Family Medicine

## 2019-11-28 DIAGNOSIS — Z944 Liver transplant status: Secondary | ICD-10-CM | POA: Diagnosis not present

## 2019-11-28 NOTE — Telephone Encounter (Signed)
Patient returned call regarding lab results

## 2019-11-28 NOTE — Addendum Note (Signed)
Addended by: Octaviano Glow on: 11/28/2019 02:58 PM   Modules accepted: Orders

## 2019-11-28 NOTE — Telephone Encounter (Signed)
Pt agreed to go to [facility] urology

## 2019-11-29 DIAGNOSIS — J181 Lobar pneumonia, unspecified organism: Secondary | ICD-10-CM | POA: Diagnosis not present

## 2019-12-04 NOTE — Progress Notes (Signed)
Discharge Progress Report  Patient Details  Name: William [name] MRN: [id number] Date of Birth: [birth year] Referring Provider:     Pulmonary Rehab Walk Test from 08/28/2019 in [place]  Referring Provider William [name], William Gosselin, William [name]       Number of Visits: 16  Reason for Discharge:  Patient reached a stable level of exercise. Patient independent in their exercise. Patient has met program and personal goals.  Smoking History:  Social History   Tobacco Use  Smoking Status Former Smoker  . 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[place])  ADL [facility]:  Pulmonary Assessment Scores    Row Name 08/28/19 1113 09/05/19 1504 11/05/19 1518     ADL [facility]   ADL Phase -- Entry Exit   SOB Score total -- 46 13     CAT Score   CAT Score -- 18 6     mMRC Score   mMRC Score 2 -- --   Row Name 11/07/19 1533         mMRC Score   mMRC Score 0            Initial Exercise Prescription:  Initial Exercise Prescription - 08/28/19 1500      Date of Initial Exercise RX and Referring Provider   Date 08/28/19    Referring Provider William [name], William Lynn, William [name]      Recumbant Bike   Level 2    Watts 20    Minutes 15    METs 2.76      Track   Laps 13    Minutes 15    METs 2.51      Prescription Details   Frequency (times per week) 2    Duration Progress to 30 minutes of continuous aerobic without signs/symptoms of physical distress      Intensity   THRR 40-80% of Max Heartrate 60-121    Ratings of Perceived Exertion 11-13    Perceived Dyspnea 0-4      Progression   Progression Continue to progress workloads to maintain intensity without signs/symptoms of physical distress.      Resistance Training   Training Prescription No           Discharge Exercise Prescription (Final Exercise Prescription Changes):  Exercise Prescription Changes - 11/05/19 1600      Response  to Exercise   Blood Pressure (Admit) 140/70    Blood Pressure (Exit) 118/58    Heart Rate (Admit) 83 bpm    Heart Rate (Exercise) 116 bpm    Heart Rate (Exit) 94 bpm    Oxygen Saturation (Admit) 96 %    Oxygen Saturation (Exercise) 94 %    Oxygen Saturation (Exit) 96 %    Rating of Perceived Exertion (Exercise) 13    Perceived Dyspnea (Exercise) 2    Duration Continue with 30 min of aerobic exercise without signs/symptoms of physical distress.    Intensity THRR unchanged      Progression   Progression Continue to progress workloads to maintain intensity without signs/symptoms of physical distress.    Average METs 2.8      Resistance Training   Training Prescription Yes    Weight 5lbs    Reps 10-15    Time 10 Minutes      Recumbant Bike   Level 3    Watts 20    Minutes 15    METs 2.6  Track   Laps 17    Minutes 15    METs 2.97           Functional Capacity:  6 Minute Walk    Row Name 08/28/19 1120 11/07/19 1530       6 Minute Walk   Phase Initial Discharge    Distance 1218 feet 1479 feet    Distance % Change -- 21.42 %    Distance Feet Change -- 261 ft    Walk Time 6 minutes 6 minutes    # of Rest Breaks 0 0    MPH 2.31 2.8    METS 3.11 3.58    RPE 12 10    Perceived Dyspnea  2 0    VO2 Peak 10.9 12.54    Symptoms No No    Resting HR 83 bpm 78 bpm    Resting BP 136/80 120/80    Resting Oxygen Saturation  96 % 97 %    Exercise Oxygen Saturation  during 6 min walk 93 % 93 %    Max Ex. HR 119 bpm 106 bpm    Max Ex. BP 136/76 160/82    2 Minute Post BP 148/82 160/72      Interval HR   1 Minute HR 112 102    2 Minute HR 116 101    3 Minute HR 116 106    4 Minute HR 117 99    5 Minute HR 116 102    6 Minute HR 119 104    2 Minute Post HR 97 84    Interval Heart Rate? Yes Yes      Interval Oxygen   Interval Oxygen? Yes Yes    Baseline Oxygen Saturation % 96 % 97 %    1 Minute Oxygen Saturation % 94 % 95 %    1 Minute Liters of Oxygen 0 L 0 L     2 Minute Oxygen Saturation % 94 % 94 %    2 Minute Liters of Oxygen 0 L 0 L    3 Minute Oxygen Saturation % 94 % 94 %    3 Minute Liters of Oxygen 0 L 0 L    4 Minute Oxygen Saturation % 94 % 94 %    4 Minute Liters of Oxygen 0 L 0 L    5 Minute Oxygen Saturation % 93 % 93 %    5 Minute Liters of Oxygen 0 L 0 L    6 Minute Oxygen Saturation % 93 % 94 %    6 Minute Liters of Oxygen 0 L 0 L    2 Minute Post Oxygen Saturation % 94 % 97 %    2 Minute Post Liters of Oxygen 0 L 0 L           Psychological, QOL, Others - Outcomes: PHQ 2/9: Depression screen [facility] 2/9 11/07/2019 08/28/2019 10/18/2018 10/18/2018 01/26/2018  Decreased Interest 0 0 - 0 0  Down, Depressed, Hopeless 0 0 0 0 0  PHQ - 2 Score 0 0 0 0 0  Altered sleeping 0 0 0 - 1  Tired, decreased energy 0 0 0 - 1  Change in appetite 0 0 0 - 0  Feeling bad or failure about yourself  0 0 0 - 0  Trouble concentrating 0 0 0 - 0  Moving slowly or fidgety/restless 0 0 0 - 0  Suicidal thoughts 0 0 0 - 0  PHQ-9 Score 0 0 0 - 2  Difficult  doing work/chores - - Not difficult at all - Not difficult at all  Some recent data might be hidden    Quality of Life:   Personal Goals: Goals established at orientation with interventions provided to work toward goal.  Personal Goals and Risk Factors at Admission - 08/28/19 1145      Core Components/Risk Factors/Patient Goals on Admission   Improve shortness of breath with ADL's Yes    Intervention Provide education, individualized exercise plan and daily activity instruction to help decrease symptoms of SOB with activities of daily living.    Expected Outcomes Short Term: Improve cardiorespiratory fitness to achieve a reduction of symptoms when performing ADLs;Long Term: Be able to perform more ADLs without symptoms or delay the onset of symptoms            Personal Goals Discharge:  Goals and Risk Factor Review    Row Name 08/28/19 1145 09/18/19 1459 10/16/19 2316 11/05/19 1547  12/04/19 2135     Core Components/Risk Factors/Patient Goals Review   Personal Goals Review Increase knowledge of respiratory medications and ability to use respiratory devices properly.;Improve shortness of breath with ADL's;Develop more efficient breathing techniques such as purse lipped breathing and diaphragmatic breathing and practicing self-pacing with activity. Increase knowledge of respiratory medications and ability to use respiratory devices properly.;Improve shortness of breath with ADL's;Develop more efficient breathing techniques such as purse lipped breathing and diaphragmatic breathing and practicing self-pacing with activity. -- Increase knowledge of respiratory medications and ability to use respiratory devices properly.;Improve shortness of breath with ADL's;Develop more efficient breathing techniques such as purse lipped breathing and diaphragmatic breathing and practicing self-pacing with activity. --   Review -- Dewell is off to a wonderful start with the completion of 4 exercise sessions.  Pt has pulse ox and understands how to use it as well as acceptable parameters.  Pt continues to use the incentive spirometer he was given post operatively.  Pt able to demonstrate efficient breathing techniques such as PLB.  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 Odai is doing well and has completed 11  exercise sessions.  Pt has pulse ox and understands how to use it as well as acceptable parameters.  Pt continues to use the incentive spirometer he was given post operatively.  Pt able to demonstrate efficient breathing techniques such as PLB.  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 Pt has increased his MET level with increasing workloads. Dayron is doing well and has completed 16  exercise sessions.  Pt doing well with exercise and averages 17 exercise sessions  and level 3 on the recumbent bike.  Pt independently uses breathing techniques  maintais level 2 on the dyspnea scale. Jaris graduates with the completion of 16 exercises.  Khalen had an increase of 261 feet post walk test.  Laverna Peace completed  post assessment -Dyspnea scale showed decrease from 46 to 17. COPD assessment decreased from 18 to 6.   Expected Outcomes -- -- See admission goals. See admission goals. Remijio met admission goals          Exercise Goals and Review:  Exercise Goals    Row Name 08/28/19 1130 09/17/19 1457           Exercise Goals   Increase Physical Activity Yes Yes      Intervention Provide advice, education, support and counseling about physical activity/exercise needs.;Develop an individualized exercise prescription for aerobic and resistive training based on initial evaluation findings, risk stratification,  comorbidities and participant's personal goals. Provide advice, education, support and counseling about physical activity/exercise needs.;Develop an individualized exercise prescription for aerobic and resistive training based on initial evaluation findings, risk stratification, comorbidities and participant's personal goals.      Expected Outcomes Short Term: Attend rehab on a regular basis to increase amount of physical activity.;Long Term: Exercising regularly at least 3-5 days a week.;Long Term: Add in home exercise to make exercise part of routine and to increase amount of physical activity. Short Term: Attend rehab on a regular basis to increase amount of physical activity.;Long Term: Add in home exercise to make exercise part of routine and to increase amount of physical activity.;Long Term: Exercising regularly at least 3-5 days a week.      Increase Strength and Stamina Yes Yes      Intervention Provide advice, education, support and counseling about physical activity/exercise needs.;Develop an individualized exercise prescription for aerobic and resistive training based on initial evaluation findings, risk stratification, comorbidities and  participant's personal goals. Provide advice, education, support and counseling about physical activity/exercise needs.;Develop an individualized exercise prescription for aerobic and resistive training based on initial evaluation findings, risk stratification, comorbidities and participant's personal goals.      Expected Outcomes Short Term: Increase workloads from initial exercise prescription for resistance, speed, and METs.;Short Term: Perform resistance training exercises routinely during rehab and add in resistance training at home;Long Term: Improve cardiorespiratory fitness, muscular endurance and strength as measured by increased METs and functional capacity (6MWT) Short Term: Increase workloads from initial exercise prescription for resistance, speed, and METs.;Short Term: Perform resistance training exercises routinely during rehab and add in resistance training at home;Long Term: Improve cardiorespiratory fitness, muscular endurance and strength as measured by increased METs and functional capacity (6MWT)      Able to understand and use rate of perceived exertion (RPE) scale Yes Yes      Intervention Provide education and explanation on how to use RPE scale Provide education and explanation on how to use RPE scale      Expected Outcomes Short Term: Able to use RPE daily in rehab to express subjective intensity level;Long Term:  Able to use RPE to guide intensity level when exercising independently Short Term: Able to use RPE daily in rehab to express subjective intensity level;Long Term:  Able to use RPE to guide intensity level when exercising independently      Able to understand and use Dyspnea scale Yes Yes      Intervention Provide education and explanation on how to use Dyspnea scale Provide education and explanation on how to use Dyspnea scale      Expected Outcomes Short Term: Able to use Dyspnea scale daily in rehab to express subjective sense of shortness of breath during exertion;Long  Term: Able to use Dyspnea scale to guide intensity level when exercising independently Short Term: Able to use Dyspnea scale daily in rehab to express subjective sense of shortness of breath during exertion;Long Term: Able to use Dyspnea scale to guide intensity level when exercising independently      Knowledge and understanding of Target Heart Rate Range (THRR) Yes Yes      Intervention Provide education and explanation of THRR including how the numbers were predicted and where they are located for reference Provide education and explanation of THRR including how the numbers were predicted and where they are located for reference      Expected Outcomes Short Term: Able to state/look up THRR;Long Term: Able to use THRR  to govern intensity when exercising independently;Short Term: Able to use daily as guideline for intensity in rehab Short Term: Able to state/look up THRR;Long Term: Able to use THRR to govern intensity when exercising independently;Short Term: Able to use daily as guideline for intensity in rehab      Understanding of Exercise Prescription Yes Yes      Intervention Provide education, explanation, and written materials on patient's individual exercise prescription Provide education, explanation, and written materials on patient's individual exercise prescription      Expected Outcomes Short Term: Able to explain program exercise prescription;Long Term: Able to explain home exercise prescription to exercise independently Short Term: Able to explain program exercise prescription;Long Term: Able to explain home exercise prescription to exercise independently             Exercise Goals Re-Evaluation:  Exercise Goals Re-Evaluation    Row Name 09/17/19 1454 10/17/19 1636 11/04/19 1549         Exercise Goal Re-Evaluation   Exercise Goals Review Increase Physical Activity;Increase Strength and Stamina;Able to understand and use rate of perceived exertion (RPE) scale;Able to understand and  use Dyspnea scale;Knowledge and understanding of Target Heart Rate Range (THRR);Understanding of Exercise Prescription Increase Physical Activity;Increase Strength and Stamina;Able to understand and use rate of perceived exertion (RPE) scale;Able to understand and use Dyspnea scale;Knowledge and understanding of Target Heart Rate Range (THRR);Able to check pulse independently;Understanding of Exercise Prescription;Improve claudication pain tolerance and improve walking ability Increase Physical Activity;Increase Strength and Stamina;Able to understand and use rate of perceived exertion (RPE) scale;Able to understand and use Dyspnea scale;Knowledge and understanding of Target Heart Rate Range (THRR);Able to check pulse independently;Understanding of Exercise Prescription     Comments pt has completed 4 exercise sessions with pulmonary rehab and has been tolerating exercise well. He is positive about exercising and incorporates walking most days of the week outside of rehab. He is currently working at 3 METs while walking the track and 2.3 METs on the Recumbent bike. We will continue to monitor and progress as he is able. Pt has completed 12 exercise sessions and is making good progression. He has not had any complaints or concerns while exercising in pulmonary rehab. He also continuous to exercise outside of rehab at least 3 days per week. He is exercising at 2.7 METs on the Recumbent bike and 2.86 METs walking the track. We will continue to monitor and progress as able Pt has completed 15 exercise sessions and continues to progress. He also exercises outside of rehab at least 3 days per week. He is currently exercising at 2.8 METS on the Bike and 2.97 METS on the track     Expected Outcomes Through exercise at pulmonary rehab and home the patient will decrease shortness of breath with daily activities and feel confident in carrying out an exercise prescription at home Through exercise at pulmonary rehab and home  the patient will decrease shortness of breath with daily activities and feel confident in carrying out an exercise prescription at home Through exercise at pulmonary rehab and home the patient will decrease shortness of breath with daily activities and feel confident in carrying out an exercise prescription at home            Nutrition & Weight - Outcomes:  Pre Biometrics - 08/28/19 1100      Pre Biometrics   Height _0  (1.753 m)    Weight 82.5 kg    BMI (Calculated) 26.85    Grip Strength 24  kg            Nutrition:  Nutrition Therapy & Goals - 09/10/19 1437      Nutrition Therapy   Diet High cal/high protein      Personal Nutrition Goals   Nutrition Goal Pt to identify food quantities necessary to achieve weight gain 0.25-1 lb per week during time in pulmonary rehab    Personal Goal #2 Pt to incorporate minimum of 1 protein shake daily and 2 servings nuts/trail mix daily      Intervention Plan   Intervention Prescribe, educate and counsel regarding individualized specific dietary modifications aiming towards targeted core components such as weight, hypertension, lipid management, diabetes, heart failure and other comorbidities.;Nutrition handout(s) given to patient.    Expected Outcomes Short Term Goal: A plan has been developed with personal nutrition goals set during dietitian appointment.           Nutrition Discharge:  Nutrition Assessments - 11/15/19 0848      MEDFICTS Scores   Pre Score 53    Post Score 56    Score Difference 3           Education Questionnaire Score:  Knowledge Questionnaire Score - 11/05/19 1517      Knowledge Questionnaire Score   Post Score 16/18           Goals reviewed with patient. Cherre Huger, BSN Cardiac and Training and development officer

## 2019-12-04 NOTE — Addendum Note (Signed)
Encounter addended by: Rowe Pavy, RN on: 12/04/2019 9:46 PM  Actions taken: Episode resolved, Flowsheet accepted, Clinical Note Signed

## 2019-12-05 DIAGNOSIS — Z944 Liver transplant status: Secondary | ICD-10-CM | POA: Diagnosis not present

## 2019-12-07 ENCOUNTER — Other Ambulatory Visit (HOSPITAL_COMMUNITY): Payer: Self-pay | Admitting: Dermatology

## 2019-12-07 MED FILL — FLUOROURACIL 5 % CREA: 5 | 14 days supply | Qty: 40 | Fill #0

## 2019-12-09 MED FILL — ZORTRESS 1 MG TABS: 1 | 30 days supply | Qty: 120 | Fill #1

## 2019-12-10 ENCOUNTER — Encounter: Payer: Self-pay | Admitting: Family Medicine

## 2019-12-10 NOTE — Telephone Encounter (Signed)
Referral placed on 10/21 and uploaded to referral site on 10/22. Nothing further has been done. Please advise, thanks.

## 2019-12-11 NOTE — Telephone Encounter (Signed)
Referral message sent to [facility] for further assistance.

## 2019-12-12 ENCOUNTER — Ambulatory Visit: Payer: 59

## 2019-12-12 ENCOUNTER — Other Ambulatory Visit: Payer: 59

## 2019-12-14 MED FILL — TACROLIMUS 1 MG CAPSULE: 1 | 30 days supply | Qty: 180 | Fill #2

## 2019-12-24 DIAGNOSIS — Z944 Liver transplant status: Secondary | ICD-10-CM | POA: Diagnosis not present

## 2019-12-30 ENCOUNTER — Ambulatory Visit (INDEPENDENT_AMBULATORY_CARE_PROVIDER_SITE_OTHER): Payer: 59 | Admitting: Cardiovascular Disease

## 2019-12-30 ENCOUNTER — Other Ambulatory Visit: Payer: Self-pay | Admitting: Cardiovascular Disease

## 2019-12-30 ENCOUNTER — Encounter: Payer: Self-pay | Admitting: Cardiovascular Disease

## 2019-12-30 VITALS — BP 152/90 | HR 75 | Ht 68.75 in | Wt 205.4 lb

## 2019-12-30 DIAGNOSIS — I350 Nonrheumatic aortic (valve) stenosis: Secondary | ICD-10-CM

## 2019-12-30 DIAGNOSIS — Z8679 Personal history of other diseases of the circulatory system: Secondary | ICD-10-CM

## 2019-12-30 DIAGNOSIS — I5032 Chronic diastolic (congestive) heart failure: Secondary | ICD-10-CM

## 2019-12-30 DIAGNOSIS — N183 Chronic kidney disease, stage 3 unspecified: Secondary | ICD-10-CM

## 2019-12-30 DIAGNOSIS — I10 Essential (primary) hypertension: Secondary | ICD-10-CM

## 2019-12-30 DIAGNOSIS — Z951 Presence of aortocoronary bypass graft: Secondary | ICD-10-CM | POA: Diagnosis not present

## 2019-12-30 DIAGNOSIS — Z944 Liver transplant status: Secondary | ICD-10-CM | POA: Diagnosis not present

## 2019-12-30 DIAGNOSIS — J181 Lobar pneumonia, unspecified organism: Secondary | ICD-10-CM | POA: Diagnosis not present

## 2019-12-30 DIAGNOSIS — I2510 Atherosclerotic heart disease of native coronary artery without angina pectoris: Secondary | ICD-10-CM | POA: Diagnosis not present

## 2019-12-30 DIAGNOSIS — Z9889 Other specified postprocedural states: Secondary | ICD-10-CM

## 2019-12-30 DIAGNOSIS — I77810 Thoracic aortic ectasia: Secondary | ICD-10-CM

## 2019-12-30 MED ORDER — CHLORTHALIDONE 25 MG PO TABS: 12.5000 mg | ORAL_TABLET | Freq: Every day | ORAL | 3 refills | Status: DC

## 2019-12-30 MED FILL — CHLORTHALIDONE 25 MG TABS: 25 | 90 days supply | Qty: 45 | Fill #0

## 2019-12-30 NOTE — Patient Instructions (Addendum)
Medication Instructions:   Start taking Chlorthalidone 12.5 mg   ( 1/2 tablet of 25 mg )  Daily   *If you need a refill on your cardiac medications before your next appointment, please call your pharmacy*   Lab Work: Not needed    Testing/Procedures:  Not needed  Follow-Up: At [facility], you and your health needs are our priority.  As part of our continuing mission to provide you with exceptional heart care, we have created designated Provider Care Teams.  These Care Teams include your primary Cardiologist (physician) and Advanced Practice Providers (APPs -  Physician Assistants and Nurse Practitioners) who all work together to provide you with the care you need, when you need it.     Your next appointment:   6 month(s)  The format for your next appointment:   In Person  Provider:   Shelva Majestic, MD   Other Instructions

## 2019-12-30 NOTE — Progress Notes (Signed)
Patient ID: William [name], male   DOB: [birth year], 69 y.o.   MRN: [id number]    HPI: William [name] is a 69 y.o. male who presents to the office today for a 34 month cardiology evaluation.  [name] has known CAD and underwent CABG revascularization surgery with a LIMA to the LAD, SVG to the RCA, SVG to the circumflex vessel in February 2008.  He has a history of obesity, metabolic syndrome, and mixed hyperlipidemia with preponderance of small dense LDL some particles and increased TG for which he has been on combination therapy.  In December 2013 a nuclear perfusion study was unchanged from 2 years previously and did not show significant scar or ischemia. In January 2014,  I adjusted his antihypertensive medications and started  Benicar HCT 40/25 for optimal blood pressure control with increase diuretic regimen and took him off his Avalide. He has felt improved on this therapy.  According to his wife, his blood pressure at home typically is in the 120s systolially and in the 30Q diastolically.  He has a history of moderate obesity.  He does not routinely exercise.  He also does note some dietary discretion. He has noticed some mild leg swelling intermittently. He denies recent anginal symptoms. He denies palpitations.   In 2016 he underwent a urologic evaluation for gross hematuria. A CT of his abdomen and pelvis did not reveal any evidence for renal calculi or hydronephrosis or evidence for renal calculi or dilatation. He did have small benign renal cysts. He was also noted a 3.5 cm infrarenal abdominal aortic aneurysm. There was also a suggestion of fatty infiltration of his liver.  He underwent a follow-up abdominal aortic ultrasound on 06/27/2014.  This showed his distal aorta measuring 3.0 x 3.1 cm and he had mild amount of atherosclerosis.  He underwent a  colonoscopy by Dr. Tyrone Sage and was found to have 2 benign polyps.  He has been off  Crestor and niacin since last year due to elevation  of his liver enzymes. There is family history for liver disease in that his mother died from liver disease and his brother had a liver transplant.  In July 2016, his AST was 51 and ALT was 54.  Subsequent blood work by Dr. [name] revealed his AST increased at 87 and ALT 68.  On 01/15/2015, his AST was 85 and ALT 64.  At that time, total cholesterol was 244, triglycerides 149, HDL 30, and his LDL cholesterol on statin therapy had risen to 184.   When I saw him in December 2017 he denied chest tightness.  He was not successful with weight loss.  Labs checked by his primary M.D.showed a hemoglobin A1c was 6.6.  LFTs had slightly improved but AST was still elevated at 71 and his ALT was now normal.   He underwent a nuclear perfusion study in March 2018.  This showed normal perfusion.  Ejection fraction was 66% with normal wall motion.  The test was felt to be low risk.  He underwent an echo Doppler study in April 2012 which showed an EF of 60-65% with mild LVH.  There was grade 2 diastolic dysfunction and increased filling pressures.  There was mild aortic stenosis with a mean gradient of 23 and peak gradient of 36, mild aortic root dilatation, mild biatrial enlargement, and mild TR.  He has had recurrent admissions for anemia and GI blood loss.  He has had undergone evaluations by Dr. [name], and Jacinto Halim and was planning  to see Dr. [name]  at [facility] for further GI evaluation and possible procedures.  He has been intolerant to statin therapy secondary to LFT elevation.    He went to [facility] and had small bowel evaluation.  He has continued to have recurrent GI bleeds.  He has required packed red blood cell transfusions.  2 weeks ago.  His hemoglobin was 9 and hematocrit 28.9, and yesterday hemoglobin was 7.5 with hematocrit of 24.1.  He is scheduled to have another red blood cell transfusion on 01/14/2017.  He has also been undergoing IV iron therapy with his hematologist, Dr. Irene Limbo.  He denies any episodes of chest pain.  His  liver transaminases have been normal, but his bilirubin has increased in the 4 range.   He was diagnosed with cirrhosis.  When I saw him in August 2019 he was getting intravenous iron is no longer on octreotide due to insurance.  He needs to be followed by Dr. Sonnie Alamo for primary care.  Denies episodes of chest tightness.  He denies presyncope or syncope.  In the past he was on Repatha but this was stopped secondary to an increased ammonia level and there was concern that this may have been contributory.  He admitted to mild fatigue and shortness of breath.  He underwent a follow-up echo Doppler study in September 2019.  He had mild LVH but on the echo there was no mention made of LV function.  There was moderate aortic valve stenosis with a mean gradient of 19 and peak gradient of 41.  Aortic valve area is 1.1 cm.  He had mild dilatation of his a sending aorta.  I last saw him in December 2019 at which time he denied any chest pain, PND or orthopnea.  He was unaware of any tachypalpitations. He was being followed by the hematology oncology team and was last seen on January 19, 2018.  Ferritin level was 123.  Iron saturation 18% which had improved from 8%.  He had mild renal insufficiency with a creatinine of 1.3.  Hemoglobin was 11.5 with hematocrit of 35.5 which was improved.   Since I last saw him, he developed pneumonia in February 2020 and bilirubin further increased to 9-10 range.  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  In August 2020 he underwent EVAR for abdominal aortic aneurysm at [facility].  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  He is being followed in their liver transplant clinic and has continued to be on prednisone and tacrolimus for immunosuppression following his transplant.  Presently he denies any chest pain or shortness of breath.  He states his blood pressure at home typically runs in the 140 to 150 range.  He tells me that  his creatinine had increased to 4 following his liver transplant but this is subsequently improved to 1.31.  He presents for evaluation.    Past Medical History:  Diagnosis Date  . AAA (abdominal aortic aneurysm) ([place]) 03/2014   repaired 2020  . Ascending aortic aneurysm (HCC)    unrepaired. Stable at 4.3 cm on imaging by [facility] thoracic surg 10/2019. Rpt imaging 10/2020 planned.  . Bilateral renal cysts    simple (03/2014 MRI)  . CAD (coronary artery disease)   . Cholelithiases 2018   asymptomatic  . Chronic diastolic heart failure ([place])   . Diabetes mellitus with complication ([place]) 66/4403   A1c 6.8%. A1c 5.4% 11/2019  . 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  Improving + off oxygen as of 3 mo s/p liver transplant.  . History of blood transfusion 2018 X 4 dates   Chronic GI blood loss of unknown location despite full GI endoscopic eval, etc  . History of liver transplant ([place]) 07/2019   [facility]  . Hyperlipemia, mixed    elevated LFTs when on statins.    . Hypertension    Cr bump 04/01/16 so I changed benicar-hct to benicar plain and added amlodipine 5 mg.  . Increased prostate specific antigen (PSA) velocity 2021   0.11 to 4.77 from 2020 to 2021 (he got a liver transplant and was put on antirejection meds between these psa checks).  . Iron deficiency anemia 05/2016   Acute blood loss anemia: hospitalized, required transfusion x 3 U: colonoscopy and capsule study unrevealing.  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  W/u for ? hemolytic anemia to be pursued by hematologist as outpt.  Dr. [name], GI at [facility] following, too---he rec'd onc do bone marrow bx as of Jan 2019  . Iron deficiency anemia due to chronic blood loss 2018/19   GI: transfusions x >20 required; multiple endoscopies and bleeding scans unrevealing. Lysteda and octreotide + monthly iron infusions as of 04/2017. Iron infusions changed  to every other month as of 09/2018 hem f/u.  [name] Liver cirrhosis secondary to NASH ([place]) 05/2016   Liver transplant 07/2019  . Liver failure ([place]) 2020   NASH cirrhosis  . Lung field abnormal finding on examination    Bibasilar L>R mild insp crackles-->x-ray showed mild interstitial changes/fibrosis/scarring.  Changes noted on all CXRs in 2018/2019.  Liver Transplant eval 03/2018->mild restriction on PFTs but no obstruction.  See further details in [place] "pulm fibrosis" section  . Microscopic hematuria    Eval unremarkable by Dr. Eulogio Ditch.  . 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 Obesity   . Pulmonary fibrosis (HCC)    PFTs: restrictive lung dz ([facility] Liver transplant eval 05/2018). [facility] pulm eval felt this was likely hepatopulmonary syndrome->causing his hypoxia->low likelihood of progression (as of 10/04/18 transplant clinic f/u). Pulm rehab helping as of 2020/2021. OFF oxygen as of 10/2019 transp f/u.  [name] Spleen enlarged     Past Surgical History:  Procedure Laterality Date  . ABDOMINAL AORTIC ANEURYSM REPAIR  08/23/2018   [facility]  . CARDIAC CATHETERIZATION    . CARDIOVASCULAR STRESS TEST  01/17/2012; 05/04/16   Normal stress nuclear study x 2 (2018--Normal perfusion. LVEF 66% with normal wall motion. This is a low risk study).  . CERVICAL DISCECTOMY  1992  . COLONOSCOPY N/A 05/27/2016   No site/explanation for blood loss found.  Erythematous mucosa in cecum and ascending colon--Cecal bx: normal.  Procedure: COLONOSCOPY;  Surgeon: Carol Ada, MD;  Location: [facility] ENDOSCOPY;  Service: Endoscopy;  Laterality: N/A;  . COLONOSCOPY W/ POLYPECTOMY  09/10/2014   Polypectomy x 2: recall 5 yrs (Dr. [name]).  . CORONARY ANGIOPLASTY    . CORONARY ARTERY BYPASS GRAFT  03/2006  . CT ABDOMEN WITH CONTRAST ([place] HX)  11/2018  . CT CHEST WWO CONTAST ([place] HX)  07/11/2019   No significant change in interstitial lung disease with possible underlying hepatopulmonary syndrome, Increased ascites in  the setting of cirrhosis, tiny new right basilar nodule  . DEXA  03/07/2019   T score 0.2/NORMAL  . ENTEROSCOPY N/A 07/31/2016   Procedure: ENTEROSCOPY;  Surgeon: Ladene Artist, MD;  Location: [facility] ENDOSCOPY;  Service: Endoscopy;  Laterality: N/A;  . ENTEROSCOPY N/A 12/02/2016   Procedure: ENTEROSCOPY;  Surgeon: Carol Ada, MD;  Location: [facility] ENDOSCOPY;  Service: Endoscopy;  Laterality: N/A;  . ESOPHAGOGASTRODUODENOSCOPY N/A 05/25/2016   No site/explanation for blood loss found.  Procedure: ESOPHAGOGASTRODUODENOSCOPY (EGD);  Surgeon: Juanita Craver, MD;  Location: [facility] ENDOSCOPY;  Service: Endoscopy;  Laterality: N/A;  . ESOPHAGOGASTRODUODENOSCOPY N/A 09/23/2016   Procedure: ESOPHAGOGASTRODUODENOSCOPY (EGD);  Surgeon: Carol Ada, MD;  Location: [place];  Service: Endoscopy;  Laterality: N/A;  . ESOPHAGOGASTRODUODENOSCOPY (EGD) WITH PROPOFOL N/A 08/21/2018   Procedure: ESOPHAGOGASTRODUODENOSCOPY (EGD) WITH PROPOFOL;  Surgeon: Carol Ada, MD;  Location: [facility] ENDOSCOPY;  Service: Endoscopy;  Laterality: N/A;  . GIVENS CAPSULE STUDY N/A 05/27/2016   No source identified.  Repeat 10/2016--results pending.  Procedure: GIVENS CAPSULE STUDY;  Surgeon: Carol Ada, MD;  Location: [facility] ENDOSCOPY;  Service: Endoscopy;  Laterality: N/A;  . GIVENS CAPSULE STUDY N/A 10/15/2016   Procedure: GIVENS CAPSULE STUDY;  Surgeon: Carol Ada, MD;  Location: [facility] ENDOSCOPY;  Service: Endoscopy;  Laterality: N/A;  . GIVENS CAPSULE STUDY N/A 02/13/2017   Procedure: GIVENS CAPSULE STUDY;  Surgeon: Carol Ada, MD;  Location: [place];  Service: Endoscopy;  Laterality: N/A;  . LIVER TRANSPLANT  08/02/2019   For NASH cirrhosis. [place]  . NECK SURGERY  1991  . NM GI BLOOD LOSS  01/27/2017   NEGATIVE  . SMALL BOWEL ENTEROSCOPY  10/2016   ([facility], Dr. [name]: Double balloon enteroscopy--to the level of proximal ileum) jejunal polyps- which were removed--not bleeding & benign.  No source of bleeding was identified.  .  TRANSTHORACIC ECHOCARDIOGRAM  05/25/2016; 10/2017   05/2016: EF 60%, normal wall motion, grd II DD, mild aortic stenosis, dilated aortic root and ascending aorta. 10/2017: Mild LVH; no mention made of LV function.  Moderate aorticstenosis with mean gradient 19 peak gradient 41. AVA 1.1 cm^2.  05/2018 EF 55%,4.3 cm ascend aneur, mild AS.  [name] VASECTOMY  1977    Allergies  Allergen Reactions  . Statins Other (See Comments)    Liver enzymes go up whenever on them    Current Outpatient Medications  Medication Sig Dispense Refill  . aspirin EC 81 MG tablet Take 81 mg by mouth daily. Swallow whole.    . calcium citrate-vitamin D (CITRACAL+D) 315-200 MG-UNIT tablet Take by mouth.    . Everolimus 1 MG TABS Take by mouth.    . Magnesium Oxide 400 (240 Mg) MG TABS Take 1 tablet by mouth 2 (two) times daily.    . metoprolol tartrate (LOPRESSOR) 25 MG tablet     . pantoprazole (PROTONIX) 40 MG tablet Take 1 tablet (40 mg total) by mouth 2 (two) times daily before a meal. TAKE 1 TABLET BY MOUTH TWICE DAILY BEFORE A MEAL 180 tablet 1  . tacrolimus (PROGRAF) 1 MG capsule Take 1 mg by mouth 2 (two) times daily. Take 3 tablets in am and 2 in evening    . Blood Glucose Monitoring Suppl (FREESTYLE LITE) DEVI     . chlorthalidone (HYGROTON) 25 MG tablet Take 0.5 tablets (12.5 mg total) by mouth daily. 45 tablet 3  . CONTOUR NEXT TEST test strip     . fluorouracil (EFUDEX) 5 % cream Apply topically 2 (two) times daily.    [name] GENERLAC 10 GM/15ML SOLN Take 20 g by mouth 3 (three) times daily.    . metoprolol tartrate (LOPRESSOR) 25 MG tablet 1 tab po qAM and 1/2 tab po qhs 45  tablet 0  . Niacinamide-Zn-Cu-Methfo-Se-Cr (NICOTINAMIDE PO)     . predniSONE (DELTASONE) 5 MG tablet 1/2 tab po qd 15 tablet 0   No current facility-administered medications for this visit.   Facility-Administered Medications Ordered in Other Visits  Medication Dose Route Frequency Provider Last Rate Last Admin  . heparin lock flush 100  unit/mL  500 Units Intracatheter Daily PRN Truitt Merle, MD      . sodium chloride flush (NS) 0.9 % injection 10 mL  10 mL Intracatheter PRN Truitt Merle, MD        Socially he's married has 3 children 3 grandchildren. Does not routinely exercise. In the past he had been walking 4 miles on a treadmill but has not done this in some time. There is no tobacco or alcohol use.  ROS General: Negative; No fevers, chills, or night sweats;  HEENT: Negative; No changes in vision or hearing, sinus congestion, difficulty swallowing Pulmonary: Negative; No cough, wheezing, shortness of breath, hemoptysis Cardiovascular: Negative; No chest pain, presyncope, syncope, palpitations Positive for Increasing leg swelling GI: Positive for polyps on colonoscopy; positive for fatty liver; recent GI bleeding and symptomatic anemia, cirrhosis GU: Negative; No dysuria, hematuria, or difficulty voiding Musculoskeletal: Negative; no myalgias, joint pain, or weakness Hematologic/Oncology: Negative; no easy bruising, bleeding Endocrine: Positive for metabolic syndrome/mild diabetes; no heat/cold intolerance; Neuro: Negative; no changes in balance, headaches Skin: Negative; No rashes or skin lesions Psychiatric: Negative; No behavioral problems, depression Sleep: Negative; No snoring, daytime sleepiness, hypersomnolence, bruxism, restless legs, hypnogognic hallucinations, no cataplexy Other comprehensive 14 point system review is negative.   PE BP (!) 152/90   Pulse 75   Ht 5' 8.75" (1.746 m)   Wt 205 lb 6.4 oz (93.2 kg)   BMI 30.55 kg/m    Repeat blood pressure by me 138/86  Wt Readings from Last 3 Encounters:  12/30/19 205 lb 6.4 oz (93.2 kg)  11/25/19 192 lb 6.4 oz (87.3 kg)  10/29/19 186 lb 1.1 oz (84.4 kg)   General: Alert, oriented, no distress.  Skin: normal turgor, no rashes, warm and dry HEENT: Normocephalic, atraumatic. Pupils equal round and reactive to light; sclera anicteric; extraocular muscles  intact; Nose without nasal septal hypertrophy Mouth/Parynx benign; Mallinpatti scale 3 Neck: No JVD, no carotid bruits; normal carotid upstroke Lungs: clear to ausculatation and percussion; no wheezing or rales Chest wall: without tenderness to palpitation Heart: PMI not displaced, RRR, s1 s2 normal, 2/6 systolic murmur in the aortic area, no diastolic murmur, no rubs, gallops, thrills, or heaves Abdomen: soft, nontender; no hepatosplenomehaly, BS+; abdominal aorta nontender and not dilated by palpation. Back: no CVA tenderness Pulses 2+ Musculoskeletal: full range of motion, normal strength, no joint deformities Extremities: no clubbing cyanosis or edema, Homan's sign negative  Neurologic: grossly nonfocal; Cranial nerves grossly wnl Psychologic: Normal mood and affect    ECG (independently read by me): Normal sinus rhythm at 75 bpm, incomplete right bundle branch block, QS complex V1 V2.  PR interval 204 ms, QTc interval 437 ms  December 2019 ECG (independently read by me): Normal sinus rhythm at 71 bpm.  First-degree AV block.  LVH by voltage.  QRS complex V1 V2.  August 2019 ECG (independently read by me): NSR at 71; IRBBB, QS complex V1-08 January 2017 ECG (independently read by me): Normal sinus rhythm at 79 bpm.  Incomplete right bundle branch block.  LVH by voltage.  Nonspecific ST changes.  QTc interval 509 ms.  August 2018 ECG (independently read by  me): Normal sinus rhythm at 74 bpm.  Incomplete right bundle branch block.  First-degree AV block with a PR interval 28 ms.  QRS complex V1 V2.  QTc interval 499 ms.  March 2018 ECG (independently read by me): Normal sinus rhythm at 61 bpm.  First degree AV block with a PR interval at 224 ms.  Incomplete right bundle branch block.  December 2017 ECG (independently read by me): Normal sinus rhythm at 63 bpm with first-degree AV block.  PR interval 222 ms.  Incomplete right bundle branch block.  June 2017 ECG (independently read by  me): Normal sinus rhythm at 65 bpm with first-degree AV block with a PR interval of 214 ms.  Mild RV conduction delay.  Poor anterior R-wave progression V1 through V4.  December 2016 ECG (independently read by me): Normal sinus rhythm at 66 bpm.  First-degree AV block with a PR interval at 224 ms.  June 2016 ECG (independently read by me): Normal sinus rhythm at 66. First-degree block with PR interval 28 ms.  Incomplete right bundle branch block.  QRS complex V1-3.   October 2015 ECG (independently read by me): Sinus rhythm with first-degree AV block.  Incomplete right bundle branch block.  QTc interval 447 ms; first-degree AV block with a PR interval of 220 ms  03/19/2013 ECG (independently read by me): Normal sinus rhythm at 69 beats per minute. No ectopy. Borderline first-degree block with a PR interval of 216 ms.  Prior ECG of July 18,2014:  Sinus rhythm at 57 beats per minute. Borderline first-degree block with a period of 1214 ms. No significant ST abnormalities.  LABS: BMP Latest Ref Rng & Units 11/27/2019 10/24/2019 09/03/2019  Glucose 70 - 99 mg/dL 131(H) - -  BUN 6 - 23 mg/dL 12 16 18   Creatinine 0.40 - 1.50 mg/dL 1.07 1.6(A) 1.5(A)  BUN/Creat Ratio 10 - 24 - - -  Sodium 135 - 145 mEq/L 129(L) 132(A) 133(A)  Potassium 3.5 - 5.1 mEq/L 4.1 4.2 4.7  Chloride 96 - 112 mEq/L 96 101 98(A)  CO2 19 - 32 mEq/L 24 23(A) 24(A)  Calcium 8.4 - 10.5 mg/dL 8.9 9.0 9.1   Hepatic Function Latest Ref Rng & Units 11/27/2019 10/24/2019 09/03/2019  Total Protein 6.0 - 8.3 g/dL 5.9(L) - -  Albumin 3.5 - 5.2 g/dL 4.0 3.9 3.7  AST 0 - 37 U/L 18 12(A) 12(A)  ALT 0 - 53 U/L 11 13 11   Alk Phosphatase 39 - 117 U/L 65 50 78  Total Bilirubin 0.2 - 1.2 mg/dL 1.0 - -  Bilirubin, Direct 0.0 - 0.2 mg/dL - - -   CBC Latest Ref Rng & Units 10/24/2019 09/03/2019 06/27/2019  WBC - 5.2 13.9 4.6  Hemoglobin 13.5 - 17.5 14.0 12.2(A) 11.5(L)  Hematocrit 41 - 53 43 37(A) 33.3(L)  Platelets 150 - 399 177 231 61(L)    Lab Results  Component Value Date   MCV 98.5 06/27/2019   MCV 95.7 05/02/2019   MCV 96.2 03/07/2019   Lab Results  Component Value Date   TSH 1.19 05/04/2016   Lab Results  Component Value Date   HGBA1C 5.4 11/27/2019   Lipid Panel     Component Value Date/Time   CHOL 162 01/25/2017 0000   CHOL 135 11/29/2013 0940   TRIG 76 01/25/2017 0000   TRIG 177 (H) 11/29/2013 0940   HDL 47 01/25/2017 0000   HDL 34 (L) 11/29/2013 0940   CHOLHDL 3.4 01/25/2017 0000   CHOLHDL 4 09/29/2016  1118   VLDL 13.8 09/29/2016 1118   LDLCALC 100 (H) 01/25/2017 0000   LDLCALC 66 11/29/2013 0940     RADIOLOGY: No results found.   I extensively reviewed the medical records over the past 2 years from [place]:  1. Coronary artery disease involving native coronary artery of native heart without angina pectoris   2. Essential hypertension   3. Chronic diastolic CHF (congestive heart failure) ([place])   4. Hx of CABG   5. Status post endovascular aneurysm repair (EVAR): AAA 09/30/2018   6. Liver transplant recipient [facility])   7. Ascending aorta dilation (HCC)   8. Moderate aortic stenosis   9. Stage 3 chronic kidney disease, unspecified whether stage 3a or 3b CKD ([place])     ASSESSMENT AND PLAN: Mr. Bartoli is a 69 year-old gentleman with a history of CAD who underwent CABG revascularization surgery x4 in February 2008 . A nuclear perfusion study in December 2013 showed fairly normal perfusion without scar or ischemia. A five-year follow-up study in March 2018 continued to be low risk demonstrating normal perfusion and function.. He has significant mixed hyperlipidemia and in the past has been documented to have a preponderance of small dense LDL particles and significantly increased triglycerides. Remotely he initially tolerated combination therapy with Niaspan 2000 mg and Crestor 20 mg without side effects.  However, he subsequently developed LFT elevation and statin and niacin were  discontinued and LFTs normalized.  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  He subsequently developed cirrhosis and underwent ultimately successful liver transplantation on the fourth attempt on August 02, 2019.  He is now on tacrolimus and prednisone for immunosuppression.  His blood pressure at home has been elevated and is BP today was initially 152/90 and on repeat by me was 138/86.  He has been on  metoprolol tartrate 37.5 mg twice a day.  With his blood pressure elevation, I have recommended additional treatment.  Although I had initially considered the possibility of amlodipine I elected not to start this due to its potential interaction with tacrolimus and increasing levels.  As result I will start him on chlorthalidone 12.5 mg which should not have an interaction with his immunosuppressive therapy reduce potential for edema and improve his blood pressure.  He tells me he undergoes frequent laboratory at [facility] and will have repeat blood work done in 2 weeks prior to his next evaluation.  He is not having any anginal symptoms.  Previous echo has demonstrated moderate aortic valve stenosis.  There is no presyncope or syncope or exertional dyspnea.  He will monitor his blood pressure.  I will see him in 6 months for follow-up evaluation.  Time spent: 40 minutes Troy Sine, MD, [facility]  01/04/2020 10:53 AM

## 2020-01-04 ENCOUNTER — Encounter: Payer: Self-pay | Admitting: Cardiovascular Disease

## 2020-01-06 DIAGNOSIS — Z944 Liver transplant status: Secondary | ICD-10-CM | POA: Diagnosis not present

## 2020-01-12 MED FILL — TACROLIMUS 1 MG CAPSULE: 1 | 30 days supply | Qty: 180 | Fill #3

## 2020-01-13 MED FILL — ZORTRESS 1 MG TABS: 1 | 30 days supply | Qty: 120 | Fill #2

## 2020-01-19 DIAGNOSIS — M25521 Pain in right elbow: Secondary | ICD-10-CM | POA: Diagnosis not present

## 2020-01-19 DIAGNOSIS — L03113 Cellulitis of right upper limb: Secondary | ICD-10-CM | POA: Diagnosis not present

## 2020-01-21 DIAGNOSIS — M7021 Olecranon bursitis, right elbow: Secondary | ICD-10-CM | POA: Diagnosis not present

## 2020-01-26 ENCOUNTER — Telehealth: Payer: 59 | Admitting: Nurse Practitioner

## 2020-01-26 DIAGNOSIS — M109 Gout, unspecified: Secondary | ICD-10-CM

## 2020-01-26 MED ORDER — PREDNISONE 20 MG PO TABS: 40.0000 mg | ORAL_TABLET | Freq: Every day | ORAL | 0 refills | Status: AC

## 2020-01-26 NOTE — Progress Notes (Signed)
E-Visit for Gout  We are sorry that you are not feeling well. We are here to help!  Based on what you shared with me it looks like you have a flare of your gout.  Gout is a form of arthritis. It can cause pain and swelling in the joints. At first, it tends to affect only 1 joint - most frequently the big toe. It happens in people who have too much uric acid in the blood. Uric acid is a chemical that is produced when the body breaks down certain foods. Uric acid can form sharp needle-like crystals that build up in the joints and cause pain. Uric acid crystals can also form inside the tubes that carry urine from the kidneys to the bladder. These crystals can turn into "kidney stones" that can cause pain and problems with the flow of urine. People with gout get sudden "flares" or attacks of severe pain, most often the big toe, ankle, or knee. Often the joint also turns red and swells. Usually, only 1 joint is affected, but some people have pain in more than 1 joint. Gout flares tend to happen more often during the night.  The pain from gout can be extreme. The pain and swelling are worst at the beginning of a gout flare. The symptoms then get better within a few days to weeks. It is not clear how the body "turns off" a gout flare.  Do not start any NEW preventative medicine until the gout has cleared completely. However, If you are already on Probenecid or Allopurinol for CHRONIC gout, you may continue taking this during an active flare up  I have prescribed Prednisone 40 mg daily for 7 days. This is the best recommendation for you at this time based on your restrictions with medications. Prednisone is a steroid, the same class of medication as the injection you received. It is not recommended to receive another steroid injection at this time. I recommend follow up with your transplant team.  If you are unable to take Prednisone, you could go back to Urgent Care, but I am not sure there is anything else they  would recommend.    HOME CARE Losing weight can help relieve gout. It's not clear that following a specific diet plan will help with gout symptoms but eating a balanced diet can help improve your overall health. It can also help you lose weight, if you are overweight. In general, a healthy diet includes plenty of fruits, vegetables, whole grains, and low-fat dairy products (labelled "low fat", skim, 2%). Avoid sugar sweetened drinks (including sodas, tea, juice and juice blends, coffee drinks and sports drinks) Limit alcohol to 1-2 drinks of beer, spirits or wine daily these can make gout flares worse. Some people with gout also have other health problems, such as heart disease, high blood pressure, kidney disease, or obesity. If you have any of these issues, it's important to work with your doctor to manage them. This can help improve your overall health and might also help with your gout.  GET HELP RIGHT AWAY IF: . Your symptoms persist after you have completed your treatment plan . You develop severe diarrhea . You develop abnormal sensations .  You develop vomiting,  .  You develop weakness .  You develop abdominal pain  FOLLOW UP WITH YOUR PRIMARY PROVIDER IF: . If your symptoms do not improve within 10 days  MAKE SURE YOU   Understand these instructions.  Will watch your condition.  Will get  help right away if you are not doing well or get worse.  Your e-visit answers were reviewed by a board certified advanced clinical practitioner to complete your personal care plan. Depending upon the condition, your plan could have included both over the counter or prescription medications.  Your safety is important to [place]. If you have drug allergies check your prescription carefully.   You can use MyChart to ask questions about today's visit, request a non-urgent call back, or ask for a work or school excuse for 24 hours related to this e-Visit. If it has been greater than 24 hours you will  need to follow up with your provider, or enter a new e-Visit to address those concerns. You will get an e-mail with a link to a survey asking about your experience.  We hope that your e-visit has been valuable and will speed your recovery! Thank you for using e-visits.  I have spent at least 5 minutes reviewing and documenting in the patient's chart.

## 2020-01-27 ENCOUNTER — Encounter: Payer: Self-pay | Admitting: Family Medicine

## 2020-01-27 DIAGNOSIS — Z5181 Encounter for therapeutic drug level monitoring: Secondary | ICD-10-CM | POA: Diagnosis not present

## 2020-01-27 DIAGNOSIS — N179 Acute kidney failure, unspecified: Secondary | ICD-10-CM | POA: Diagnosis not present

## 2020-01-27 DIAGNOSIS — I129 Hypertensive chronic kidney disease with stage 1 through stage 4 chronic kidney disease, or unspecified chronic kidney disease: Secondary | ICD-10-CM | POA: Diagnosis not present

## 2020-01-27 DIAGNOSIS — M10021 Idiopathic gout, right elbow: Secondary | ICD-10-CM | POA: Diagnosis not present

## 2020-01-27 DIAGNOSIS — D84821 Immunodeficiency due to drugs: Secondary | ICD-10-CM | POA: Diagnosis not present

## 2020-01-27 DIAGNOSIS — Z4823 Encounter for aftercare following liver transplant: Secondary | ICD-10-CM | POA: Diagnosis not present

## 2020-01-27 DIAGNOSIS — D849 Immunodeficiency, unspecified: Secondary | ICD-10-CM | POA: Diagnosis not present

## 2020-01-27 DIAGNOSIS — N189 Chronic kidney disease, unspecified: Secondary | ICD-10-CM | POA: Diagnosis not present

## 2020-01-27 DIAGNOSIS — Z944 Liver transplant status: Secondary | ICD-10-CM | POA: Diagnosis not present

## 2020-01-27 DIAGNOSIS — Z79899 Other long term (current) drug therapy: Secondary | ICD-10-CM | POA: Diagnosis not present

## 2020-01-27 LAB — CBC AND DIFFERENTIAL
HCT: 42 (ref 41–53)
Hemoglobin: 13.5 (ref 13.5–17.5)
Neutrophils Absolute: 11.53
Platelets: 304 (ref 150–399)
WBC: 13.1

## 2020-01-29 DIAGNOSIS — J181 Lobar pneumonia, unspecified organism: Secondary | ICD-10-CM | POA: Diagnosis not present

## 2020-01-30 ENCOUNTER — Encounter: Payer: Self-pay | Admitting: Family Medicine

## 2020-02-03 NOTE — Telephone Encounter (Signed)
Yes this is ok, pls do letter-thx

## 2020-02-03 NOTE — Telephone Encounter (Signed)
Please advise, is it okay to draft letter for o2 pick up

## 2020-02-04 ENCOUNTER — Encounter: Payer: Self-pay | Admitting: Family Medicine

## 2020-02-04 MED FILL — PANTOPRAZOLE SOD DR 40 MG T: 40 | 90 days supply | Qty: 90 | Fill #1

## 2020-02-04 NOTE — Telephone Encounter (Signed)
Letter faxed to [employer]

## 2020-02-05 ENCOUNTER — Encounter: Payer: Self-pay | Admitting: Family Medicine

## 2020-02-06 ENCOUNTER — Encounter: Payer: Self-pay | Admitting: Family Medicine

## 2020-02-06 ENCOUNTER — Other Ambulatory Visit (HOSPITAL_COMMUNITY): Payer: Self-pay | Admitting: Orthopedic Surgery

## 2020-02-06 DIAGNOSIS — S92302A Fracture of unspecified metatarsal bone(s), left foot, initial encounter for closed fracture: Secondary | ICD-10-CM | POA: Diagnosis not present

## 2020-02-06 DIAGNOSIS — M545 Low back pain, unspecified: Secondary | ICD-10-CM | POA: Diagnosis not present

## 2020-02-06 DIAGNOSIS — M79671 Pain in right foot: Secondary | ICD-10-CM | POA: Diagnosis not present

## 2020-02-06 DIAGNOSIS — M79672 Pain in left foot: Secondary | ICD-10-CM | POA: Diagnosis not present

## 2020-02-06 MED FILL — predniSONE 10 MG (21) TBPK: 10 | 6 days supply | Qty: 21 | Fill #0

## 2020-02-08 DIAGNOSIS — U071 COVID-19: Secondary | ICD-10-CM

## 2020-02-08 DIAGNOSIS — N183 Chronic kidney disease, stage 3 unspecified: Secondary | ICD-10-CM

## 2020-02-08 HISTORY — DX: COVID-19: U07.1

## 2020-02-08 HISTORY — DX: Chronic kidney disease, stage 3 unspecified: N18.30

## 2020-02-10 ENCOUNTER — Encounter: Payer: Self-pay | Admitting: Family Medicine

## 2020-02-10 DIAGNOSIS — Z944 Liver transplant status: Secondary | ICD-10-CM | POA: Diagnosis not present

## 2020-02-10 DIAGNOSIS — R972 Elevated prostate specific antigen [PSA]: Secondary | ICD-10-CM | POA: Diagnosis not present

## 2020-02-10 NOTE — Telephone Encounter (Signed)
FYI  Please see below

## 2020-02-13 ENCOUNTER — Other Ambulatory Visit (HOSPITAL_COMMUNITY): Payer: Self-pay | Admitting: Internal Medicine

## 2020-02-13 DIAGNOSIS — J849 Interstitial pulmonary disease, unspecified: Secondary | ICD-10-CM | POA: Diagnosis not present

## 2020-02-13 DIAGNOSIS — K7681 Hepatopulmonary syndrome: Secondary | ICD-10-CM | POA: Diagnosis not present

## 2020-02-13 DIAGNOSIS — J84111 Idiopathic interstitial pneumonia, not otherwise specified: Secondary | ICD-10-CM | POA: Diagnosis not present

## 2020-02-13 DIAGNOSIS — Z944 Liver transplant status: Secondary | ICD-10-CM | POA: Diagnosis not present

## 2020-02-14 MED FILL — EVEROLIMUS 1 MG TABS: 1 | 30 days supply | Qty: 120 | Fill #0

## 2020-02-15 MED FILL — METOPROLOL TARTRATE 25 MG T: 25 | 90 days supply | Qty: 270 | Fill #1

## 2020-02-20 DIAGNOSIS — Z944 Liver transplant status: Secondary | ICD-10-CM | POA: Diagnosis not present

## 2020-02-26 MED FILL — TACROLIMUS 1 MG CAPSULE: 1 | 30 days supply | Qty: 180 | Fill #4

## 2020-02-27 ENCOUNTER — Encounter: Payer: Self-pay | Admitting: Family Medicine

## 2020-02-29 DIAGNOSIS — J181 Lobar pneumonia, unspecified organism: Secondary | ICD-10-CM | POA: Diagnosis not present

## 2020-03-03 DIAGNOSIS — Z944 Liver transplant status: Secondary | ICD-10-CM | POA: Diagnosis not present

## 2020-03-04 ENCOUNTER — Encounter: Payer: Self-pay | Admitting: Family Medicine

## 2020-03-04 DIAGNOSIS — Z944 Liver transplant status: Secondary | ICD-10-CM

## 2020-03-04 DIAGNOSIS — D849 Immunodeficiency, unspecified: Secondary | ICD-10-CM

## 2020-03-04 DIAGNOSIS — U071 COVID-19: Secondary | ICD-10-CM

## 2020-03-04 NOTE — Telephone Encounter (Signed)
FYI: I talked to pt and his wife.  Onset of mild body aches and T 100.5 on 03/02/20, otherwise feeling well.   I ordered RF for [facility] infusion clinic. Signed:  Crissie Sickles, MD           03/04/2020

## 2020-03-04 NOTE — Telephone Encounter (Signed)
Please advise 

## 2020-03-05 ENCOUNTER — Other Ambulatory Visit: Payer: Self-pay | Admitting: Infectious Diseases

## 2020-03-05 ENCOUNTER — Ambulatory Visit (HOSPITAL_COMMUNITY)
Admission: RE | Admit: 2020-03-05 | Discharge: 2020-03-05 | Disposition: A | Discharge status: Home / Self Care | Payer: 59 | Source: Ambulatory Visit | Attending: Pulmonary Disease | Admitting: Pulmonary Disease

## 2020-03-05 ENCOUNTER — Telehealth: Payer: Self-pay | Admitting: Infectious Diseases

## 2020-03-05 DIAGNOSIS — D849 Immunodeficiency, unspecified: Secondary | ICD-10-CM | POA: Insufficient documentation

## 2020-03-05 DIAGNOSIS — I10 Essential (primary) hypertension: Secondary | ICD-10-CM | POA: Insufficient documentation

## 2020-03-05 DIAGNOSIS — U071 COVID-19: Secondary | ICD-10-CM | POA: Insufficient documentation

## 2020-03-05 DIAGNOSIS — R54 Age-related physical debility: Secondary | ICD-10-CM | POA: Insufficient documentation

## 2020-03-05 MED ORDER — FAMOTIDINE IN NACL 20-0.9 MG/50ML-% IV SOLN: 20.0000 mg | Freq: Once | INTRAVENOUS | Status: DC | PRN

## 2020-03-05 MED ORDER — ALBUTEROL SULFATE HFA 108 (90 BASE) MCG/ACT IN AERS: 2.0000 | INHALATION_SPRAY | Freq: Once | RESPIRATORY_TRACT | Status: DC | PRN

## 2020-03-05 MED ORDER — SODIUM CHLORIDE 0.9 % IV SOLN: INTRAVENOUS | Status: DC | PRN

## 2020-03-05 MED ORDER — DIPHENHYDRAMINE HCL 50 MG/ML IJ SOLN: 50.0000 mg | Freq: Once | INTRAMUSCULAR | Status: DC | PRN

## 2020-03-05 MED ORDER — METHYLPREDNISOLONE SODIUM SUCC 125 MG IJ SOLR: 125.0000 mg | Freq: Once | INTRAMUSCULAR | Status: DC | PRN

## 2020-03-05 MED ORDER — EPINEPHRINE 0.3 MG/0.3ML IJ SOAJ: 0.3000 mg | Freq: Once | INTRAMUSCULAR | Status: DC | PRN

## 2020-03-05 MED ORDER — SOTROVIMAB 500 MG/8ML IV SOLN
500.0000 mg | Freq: Once | INTRAVENOUS | Status: AC
  Administered 2020-03-05: 500 mg via INTRAVENOUS

## 2020-03-05 NOTE — Progress Notes (Signed)
Diagnosis: COVID-19  Physician: Dr. Patrick Wright  Procedure: Cov[facility] Infusion Clinic Med: Sotrovimab infusion - Provided patient with sotrovimab fact sheet for patients, parents, and caregivers prior to infusion.   Complications: No immediate complications noted  Discharge: Discharged home    

## 2020-03-05 NOTE — Discharge Instructions (Signed)

## 2020-03-05 NOTE — Progress Notes (Signed)
Patient reviewed Fact Sheet for Patients, Parents, and Caregivers for Emergency Use Authorization (EUA) of sotrovimab for the Treatment of Coronavirus. Patient also reviewed and is agreeable to the estimated cost of treatment. Patient is agreeable to proceed.   

## 2020-03-05 NOTE — Telephone Encounter (Signed)
Called to discuss with patient about COVID-19 symptoms and the use of one of the available treatments for those with mild to moderate Covid symptoms and at a high risk of hospitalization.  Pt appears to qualify for outpatient treatment due to co-morbid conditions and/or a member of an at-risk group in accordance with the FDA Emergency Use Authorization.    Symptom onset: 1/24/ Vaccinated: Moderna completed 04/05/2019 Booster? Yes, September 2021 Immunocompromised? Yes - liver transplant recipient  Qualifiers: age, transplant patient, multiple cardiac co morbidities.    Scheduled for MAB today.   William [name]

## 2020-03-05 NOTE — Addendum Note (Signed)
Addended by: Llano Callas on: 03/05/2020 09:21 AM   Modules accepted: Orders, SmartSet

## 2020-03-05 NOTE — Progress Notes (Signed)
I connected by phone with William [name] on 03/05/2020 at 9:20 AM to discuss the potential use of a new treatment for mild to moderate COVID-19 viral infection in non-hospitalized patients.  This patient is a 70 y.o. male that meets the FDA criteria for Emergency Use Authorization of COVID monoclonal antibody sotrovimab.  Has a (+) direct SARS-CoV-2 viral test result  Has mild or moderate COVID-19   Is NOT hospitalized due to COVID-19  Is within 10 days of symptom onset  Has at least one of the high risk factor(s) for progression to severe COVID-19 and/or hospitalization as defined in EUA.  Specific high risk criteria : Older age (>/= 71 yo), Immunosuppressive Disease or Treatment and Cardiovascular disease or hypertension   I have spoken and communicated the following to the patient or parent/caregiver regarding COVID monoclonal antibody treatment:  1. FDA has authorized the emergency use for the treatment of mild to moderate COVID-19 in adults and pediatric patients with positive results of direct SARS-CoV-2 viral testing who are 44 years of age and older weighing at least 40 kg, and who are at high risk for progressing to severe COVID-19 and/or hospitalization.  2. The significant known and potential risks and benefits of COVID monoclonal antibody, and the extent to which such potential risks and benefits are unknown.  3. Information on available alternative treatments and the risks and benefits of those alternatives, including clinical trials.  4. Patients treated with COVID monoclonal antibody should continue to self-isolate and use infection control measures (e.g., wear mask, isolate, social distance, avoid sharing personal items, clean and disinfect "high touch" surfaces, and frequent handwashing) according to CDC guidelines.   5. The patient or parent/caregiver has the option to accept or refuse COVID monoclonal antibody treatment.  After reviewing this information with the  patient, the patient has agreed to receive one of the available covid 19 monoclonal antibodies and will be provided an appropriate fact sheet prior to infusion. Janene Madeira, NP 03/05/2020 9:20 AM

## 2020-03-13 ENCOUNTER — Encounter: Payer: Self-pay | Admitting: Family Medicine

## 2020-03-23 DIAGNOSIS — R972 Elevated prostate specific antigen [PSA]: Secondary | ICD-10-CM | POA: Diagnosis not present

## 2020-03-23 LAB — PSA: PSA: 1.06

## 2020-03-23 MED FILL — EVEROLIMUS 1 MG TABS: 1 | 30 days supply | Qty: 120 | Fill #1

## 2020-03-23 MED FILL — TACROLIMUS 1 MG CAPSULE: 1 | 30 days supply | Qty: 180 | Fill #5

## 2020-03-24 DIAGNOSIS — Z944 Liver transplant status: Secondary | ICD-10-CM | POA: Diagnosis not present

## 2020-03-31 DIAGNOSIS — M79672 Pain in left foot: Secondary | ICD-10-CM | POA: Diagnosis not present

## 2020-03-31 DIAGNOSIS — M79671 Pain in right foot: Secondary | ICD-10-CM | POA: Diagnosis not present

## 2020-03-31 DIAGNOSIS — M79601 Pain in right arm: Secondary | ICD-10-CM | POA: Diagnosis not present

## 2020-03-31 DIAGNOSIS — M25521 Pain in right elbow: Secondary | ICD-10-CM | POA: Diagnosis not present

## 2020-04-13 DIAGNOSIS — Z944 Liver transplant status: Secondary | ICD-10-CM | POA: Diagnosis not present

## 2020-04-23 ENCOUNTER — Other Ambulatory Visit (HOSPITAL_COMMUNITY): Payer: Self-pay

## 2020-04-23 DIAGNOSIS — L720 Epidermal cyst: Secondary | ICD-10-CM | POA: Diagnosis not present

## 2020-04-23 DIAGNOSIS — L2084 Intrinsic (allergic) eczema: Secondary | ICD-10-CM | POA: Diagnosis not present

## 2020-04-23 DIAGNOSIS — D485 Neoplasm of uncertain behavior of skin: Secondary | ICD-10-CM | POA: Diagnosis not present

## 2020-04-23 DIAGNOSIS — L570 Actinic keratosis: Secondary | ICD-10-CM | POA: Diagnosis not present

## 2020-04-23 DIAGNOSIS — L578 Other skin changes due to chronic exposure to nonionizing radiation: Secondary | ICD-10-CM | POA: Diagnosis not present

## 2020-04-23 DIAGNOSIS — L738 Other specified follicular disorders: Secondary | ICD-10-CM | POA: Diagnosis not present

## 2020-04-24 ENCOUNTER — Other Ambulatory Visit: Payer: Self-pay | Admitting: Family Medicine

## 2020-04-24 ENCOUNTER — Encounter: Payer: Self-pay | Admitting: Family Medicine

## 2020-04-24 MED ORDER — METOPROLOL TARTRATE 50 MG PO TABS: 50.0000 mg | ORAL_TABLET | Freq: Two times a day (BID) | ORAL | 3 refills | Status: DC

## 2020-04-24 NOTE — Telephone Encounter (Signed)
OK, new metoprolol rx sent in

## 2020-04-24 NOTE — Telephone Encounter (Signed)
Pt was last seen 11/25/19, mentioned in last note "1) HTN: home monitoring shows excellent control. Continue lopressor 60m, 1 qam and 1/2 qpm." Last refill by PCP was 11/25/19(45,0). Pt due for follow up next month.   Please advise, thanks.

## 2020-04-30 DIAGNOSIS — Z944 Liver transplant status: Secondary | ICD-10-CM | POA: Diagnosis not present

## 2020-04-30 DIAGNOSIS — N1831 Chronic kidney disease, stage 3a: Secondary | ICD-10-CM | POA: Diagnosis not present

## 2020-04-30 DIAGNOSIS — D849 Immunodeficiency, unspecified: Secondary | ICD-10-CM | POA: Diagnosis not present

## 2020-04-30 DIAGNOSIS — Z5181 Encounter for therapeutic drug level monitoring: Secondary | ICD-10-CM | POA: Diagnosis not present

## 2020-04-30 MED FILL — METOPROLOL TARTRATE 50 MG T: 50 | 90 days supply | Qty: 180 | Fill #0

## 2020-05-04 MED FILL — PANTOPRAZOLE SOD DR 40 MG T: 40 | 90 days supply | Qty: 90 | Fill #0

## 2020-05-05 ENCOUNTER — Encounter: Payer: Self-pay | Admitting: Family Medicine

## 2020-05-07 ENCOUNTER — Other Ambulatory Visit (HOSPITAL_COMMUNITY): Payer: Self-pay

## 2020-05-08 ENCOUNTER — Other Ambulatory Visit: Payer: Self-pay

## 2020-05-08 ENCOUNTER — Other Ambulatory Visit (HOSPITAL_COMMUNITY): Payer: Self-pay | Admitting: Internal Medicine

## 2020-05-08 ENCOUNTER — Ambulatory Visit: Payer: 59 | Attending: Family Medicine | Admitting: Pharmacist

## 2020-05-08 DIAGNOSIS — Z79899 Other long term (current) drug therapy: Secondary | ICD-10-CM

## 2020-05-08 MED ORDER — EVEROLIMUS 1 MG PO TABS: ORAL_TABLET | ORAL | 11 refills | Status: DC

## 2020-05-08 NOTE — Progress Notes (Signed)
   S: Patient presents today for review of their specialty medication.   Patient is currently taking Zortress s/p liver transplant. Patient is managed by Dr. Edison Pace for this. 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 He changed to everolimus/tac in Oct 2021 given his CKD as well as significant skin cancer hx.   Dosing: 71m by mouth every morning, 1 mg nightly  Adherence: confirmed   Efficacy: per CareEverywhere, immunosuppression is stable.   Monitoring:  - CBC w/ diff: see below (monitored and followed by [facility]) - LFTs: WNL (01/27/2020) - Renal function:   - Urine protein - 26 mg/dL (01/27/20)  - Scr/eGFR - WNL (136/14/43 - Metabolic parameters: [id number] (11/27/2019) - S/sx of skin malignancies: none - Other: HepB/C not detected; vaccinated against strep pneumo; HepA immune; Covid series complete (has had 3 total)  Current adverse effects: - HTN/peripheral edema: none - Abdominal pain/GI symptoms: none   - Fatigue: none  - HA: none  - Insomnia: none  - Fever/delayed wound healing: none   O:     Lab Results  Component Value Date   WBC 13.1 01/27/2020   HGB 13.5 01/27/2020   HCT 42 01/27/2020   MCV 98.5 06/27/2019   PLT 304 01/27/2020      Chemistry      Component Value Date/Time   NA 129 (L) 11/27/2019 0938   NA 132 (A) 10/24/2019 0000   NA 139 02/10/2017 0948   K 4.1 11/27/2019 0938   K 3.9 02/10/2017 0948   CL 96 11/27/2019 0938   CO2 24 11/27/2019 0938   CO2 24 02/10/2017 0948   BUN 12 11/27/2019 0938   BUN 16 10/24/2019 0000   BUN 10.4 02/10/2017 0948   CREATININE 1.07 11/27/2019 0938   CREATININE 1.43 (H) 05/02/2019 0847   CREATININE 1.3 02/10/2017 0948   GLU 103 10/24/2019 0000      Component Value Date/Time   CALCIUM 8.9 11/27/2019 0938   CALCIUM 8.9 02/10/2017 0948   ALKPHOS 65 11/27/2019 0938   ALKPHOS 108 02/10/2017 0948   AST 18 11/27/2019 0938   AST 31 05/02/2019 0847   AST 30 02/10/2017 0948   ALT 11 11/27/2019 0938   ALT  25 05/02/2019 0847   ALT 23 02/10/2017 0948   BILITOT 1.0 11/27/2019 0938   BILITOT 8.1 (HH) 05/02/2019 0847   BILITOT 4.97 (HH) 02/10/2017 0948       A/P: 1. Medication review: patient currently on Zortress S/p hepatic transplant and is tolerating well. He has been on this medication for some time and is going to start filling through our pharmacy. Reviewed the medication with the patient. He has experienced minimal side effects and is [facility] He has no concerns at this time. No recommendations for any changes.   LBenard Halsted PharmD, BPara [place] [phone]

## 2020-05-11 ENCOUNTER — Ambulatory Visit: Payer: 59 | Admitting: Pharmacist

## 2020-05-11 DIAGNOSIS — Z944 Liver transplant status: Secondary | ICD-10-CM | POA: Diagnosis not present

## 2020-05-19 ENCOUNTER — Other Ambulatory Visit (HOSPITAL_COMMUNITY): Payer: Self-pay

## 2020-05-19 MED FILL — Everolimus Tab 1 MG: ORAL | 30 days supply | Qty: 90 | Fill #0 | Status: AC

## 2020-05-19 MED FILL — Tacrolimus Cap 1 MG: ORAL | 30 days supply | Qty: 180 | Fill #0 | Status: AC

## 2020-05-20 ENCOUNTER — Other Ambulatory Visit (HOSPITAL_COMMUNITY): Payer: Self-pay

## 2020-05-21 ENCOUNTER — Other Ambulatory Visit (HOSPITAL_COMMUNITY): Payer: Self-pay

## 2020-05-22 ENCOUNTER — Other Ambulatory Visit (HOSPITAL_COMMUNITY): Payer: Self-pay

## 2020-05-27 DIAGNOSIS — N1832 Chronic kidney disease, stage 3b: Secondary | ICD-10-CM | POA: Diagnosis not present

## 2020-05-28 DIAGNOSIS — Z944 Liver transplant status: Secondary | ICD-10-CM | POA: Diagnosis not present

## 2020-05-28 DIAGNOSIS — D849 Immunodeficiency, unspecified: Secondary | ICD-10-CM | POA: Diagnosis not present

## 2020-06-17 ENCOUNTER — Ambulatory Visit (INDEPENDENT_AMBULATORY_CARE_PROVIDER_SITE_OTHER): Payer: 59 | Admitting: Podiatry

## 2020-06-17 ENCOUNTER — Ambulatory Visit (INDEPENDENT_AMBULATORY_CARE_PROVIDER_SITE_OTHER): Payer: 59

## 2020-06-17 ENCOUNTER — Other Ambulatory Visit: Payer: Self-pay

## 2020-06-17 ENCOUNTER — Other Ambulatory Visit (HOSPITAL_COMMUNITY): Payer: Self-pay

## 2020-06-17 DIAGNOSIS — M19071 Primary osteoarthritis, right ankle and foot: Secondary | ICD-10-CM | POA: Diagnosis not present

## 2020-06-17 DIAGNOSIS — M79671 Pain in right foot: Secondary | ICD-10-CM

## 2020-06-17 DIAGNOSIS — M19072 Primary osteoarthritis, left ankle and foot: Secondary | ICD-10-CM | POA: Diagnosis not present

## 2020-06-17 DIAGNOSIS — M19079 Primary osteoarthritis, unspecified ankle and foot: Secondary | ICD-10-CM

## 2020-06-18 ENCOUNTER — Encounter: Payer: Self-pay | Admitting: Podiatry

## 2020-06-18 NOTE — Progress Notes (Signed)
Left  Subjective:  Patient ID: OSEAS DETTY, male    DOB: Jan 13, 1951,  MRN: [id number]  Chief Complaint  Patient presents with  . Foot Pain    Bilateral foot pain left foot is worse     70 y.o. male presents with the above complaint.  Patient presents with complaint of dorsal midfoot flareup/arthritis.  Patient states is painful to touch.  The right side is also bothersome and left side is a lot worse.  He states that this came out of nowhere has progressive gotten worse.  He would like to discuss treatment options he has not seen anyone else prior to seeing me.  He tried soaking it putting some topical medication none of which has helped.  He denies any other acute complaints.   Review of Systems: Negative except as noted in the HPI. Denies N/V/F/Ch.  Past Medical History:  Diagnosis Date  . AAA (abdominal aortic aneurysm) ([place]) 03/2014   repaired 2020  . Ascending aortic aneurysm (H[facility])    unrepaired. Stable at 4.3 cm on imaging by [facility] thoracic surg 10/2019. F/u w/vasc surg 2022.  . Bilateral renal cysts    simple (03/2014 MRI)  . CAD (coronary artery disease)   . Cholelithiases 2018   asymptomatic  . Chronic diastolic heart failure ([place])   . COVID-19 virus infection 02/2020   sotrovimab infusion  . Diabetes mellitus with complication ([place]) 25/8527   A1c 6.8%. A1c 5.4% 11/2019  . Gout    initially dx'd by diag arthrocentesis of elbow effusion in winter 2021, 3 episodes, all came after being started on chlorthalidone->I d/c'd chlorthal.  . 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  Improving + off oxygen as of 3 mo s/p liver transplant.  Doing well/resolved as of 02/2020 [facility] pulm f/u->they'll continue to follow.  . History of blood transfusion 2018 X 4 dates   Chronic GI blood loss of unknown location despite full GI endoscopic eval, etc  . History of liver transplant ([place]) 07/2019   [facility]  . Hyperlipemia, mixed    elevated LFTs when on statins.    . Hypertension    Cr bump  04/01/16 so I changed benicar-hct to benicar plain and added amlodipine 5 mg.  . Increased prostate specific antigen (PSA) velocity 2021   0.11 to 4.77 from 2020 to 2021 (he got a liver transplant and was put on antirejection meds between these psa checks).  . Iron deficiency anemia 05/2016   Acute blood loss anemia: hospitalized, required transfusion x 3 U: colonoscopy and capsule study unrevealing.  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  W/u for ? hemolytic anemia to be pursued by hematologist as outpt.  Dr. [name], GI at [facility] following, too---he rec'd onc do bone marrow bx as of Jan 2019  . Iron deficiency anemia due to chronic blood loss 2018/19   GI: transfusions x >20 required; multiple endoscopies and bleeding scans unrevealing. Lysteda and octreotide + monthly iron infusions as of 04/2017. Iron infusions changed to every other month as of 09/2018 hem f/u.  [name] Liver cirrhosis secondary to NASH ([place]) 05/2016   Liver transplant 07/2019  . Liver failure ([place]) 2020   NASH cirrhosis  . Lung field abnormal finding on examination    Bibasilar L>R mild insp crackles-->x-ray showed mild interstitial changes/fibrosis/scarring.  Changes noted on all CXRs in 2018/2019.  Liver Transplant eval 03/2018->mild restriction on PFTs but no obstruction.  See further details in [place] "pulm fibrosis" section  . Microscopic hematuria    Eval unremarkable by Dr. Eulogio Ditch.  . 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 Obesity   . Pulmonary fibrosis (HCC)    PFTs: restrictive lung dz ([facility] Liver transplant eval 05/2018). [facility] pulm eval felt this was likely hepatopulmonary syndrome->causing his hypoxia->low likelihood of progression (as of 10/04/18 transplant clinic f/u). Pulm rehab helping as of 2020/2021. OFF oxygen as of 10/2019 transp f/u.  [name] Spleen enlarged     Current Outpatient Medications:  .  aspirin EC 81  MG tablet, Take 81 mg by mouth daily. Swallow whole., Disp: , Rfl:  .  Blood Glucose Monitoring Suppl (FREESTYLE LITE) DEVI, , Disp: , Rfl:  .  calcium citrate-vitamin D (CITRACAL+D) 315-200 MG-UNIT tablet, Take by mouth., Disp: , Rfl:  .  chlorthalidone (HYGROTON) 25 MG tablet, TAKE 1/2 TABLET BY MOUTH DAILY, Disp: 45 tablet, Rfl: 3 .  CONTOUR NEXT TEST test strip, , Disp: , Rfl:  .  Everolimus 1 MG TABS, Take 2 mg by mouth every morning, then 1 mg nightly., Disp: 90 tablet, Rfl: 11 .  Everolimus 1 MG TABS, TAKE 2 TABLETS BY MOUTH EVERY MORNING AND 2 TABLETS EVERY EVENING., Disp: 120 tablet, Rfl: 11 .  Everolimus 1 MG TABS, TAKE 2 TABLETS (2MG) BY MOUTH EVERY 12 HOURS, Disp: 120 tablet, Rfl: 11 .  fluorouracil (EFUDEX) 5 % cream, Apply topically 2 (two) times daily., Disp: , Rfl:  .  fluorouracil (EFUDEX) 5 % cream, APPLY TO AFFECTED AREAS TWICE A DAY FOR TWO WEEKS. EXPECT REDNESS AND IRRITATION., Disp: 40 g, Rfl: 1 .  GENERLAC 10 GM/15ML SOLN, Take 20 g by mouth 3 (three) times daily., Disp: , Rfl:  .  Magnesium Oxide 400 (240 Mg) MG TABS, Take 1 tablet by mouth 2 (two) times daily., Disp: , Rfl:  .  Magnesium Oxide 400 (240 Mg) MG TABS, TAKE 1 TABLET BY MOUTH TWO TIMES DAILY, Disp: 60 tablet, Rfl: 11 .  metoprolol tartrate (LOPRESSOR) 50 MG tablet, TAKE 1 TABLET BY MOUTH 2 TIMES DAILY, Disp: 180 tablet, Rfl: 3 .  Niacinamide-Zn-Cu-Methfo-Se-Cr (NICOTINAMIDE PO), , Disp: , Rfl:  .  pantoprazole (PROTONIX) 40 MG tablet, Take 1 tablet (40 mg total) by mouth 2 (two) times daily before a meal. TAKE 1 TABLET BY MOUTH TWICE DAILY BEFORE A MEAL, Disp: 180 tablet, Rfl: 1 .  pantoprazole (PROTONIX) 40 MG tablet, TAKE 1 TABLET BY MOUTH ONCE DAILY, Disp: 30 tablet, Rfl: 5 .  pantoprazole (PROTONIX) 40 MG tablet, TAKE 1 TABLET BY MOUTH ONCE A DAY, Disp: 30 tablet, Rfl: 5 .  predniSONE (DELTASONE) 5 MG tablet, 1/2 tab po qd, Disp: 15 tablet, Rfl: 0 .  predniSONE (STERAPRED UNI-PAK 21 TAB) 10 MG (21) TBPK  tablet, TAKE AS DIRECTED ON PACK, Disp: 21 each, Rfl: 0 .  tacrolimus (PROGRAF) 1 MG capsule, Take 1 mg by mouth 2 (two) times daily. Take 3 tablets in am and 2 in evening, Disp: , Rfl:  .  tacrolimus (PROGRAF) 1 MG capsule, TAKE 3 CAPSULES BY MOUTH EVERY MORNING AND 3 CAPSULES EVERY EVENING., Disp: 180 capsule, Rfl: 11 .  tretinoin (RETIN-A) 0.025 % cream, APPLY A THIN LAYER (PEA SIZED AMOUNT) TO FACE AT NIGHT. WEAR MOISTURIZER AND SUNSCREEN IN THE AM., Disp: 45 g, Rfl: 3 .  triamcinolone (KENALOG) 0.1 %, APPLY TO THE RASH 2 TIMES DAILY AS DIRECTED. FOLLOW WITH CERAVE CREAM. STOP WHEN SMOOTH, Disp:  80 g, Rfl: 2 .  ZORTRESS 1 MG TABS, TAKE 2 TABLETS (2 MG) BY MOUTH EVERY MORNING, THEN 1 TABLET (1MG) NIGHTLY., Disp: 90 tablet, Rfl: 11 No current facility-administered medications for this visit.  Facility-Administered Medications Ordered in Other Visits:  .  heparin lock flush 100 unit/mL, 500 Units, Intracatheter, Daily PRN, Truitt Merle, MD .  sodium chloride flush (NS) 0.9 % injection 10 mL, 10 mL, Intracatheter, PRN, Truitt Merle, MD  Social History   Tobacco Use  Smoking Status Former Smoker  . Types: Cigarettes  Smokeless Tobacco Never Used  Tobacco Comment   QUIT I 1983    Allergies  Allergen Reactions  . Niacinamide Itching    Improved upon discontinuation   . Statins Other (See Comments)    Liver enzymes go up whenever on them   Objective:  There were no vitals filed for this visit. There is no height or weight on file to calculate BMI. Constitutional Well developed. Well nourished.  Vascular Dorsalis pedis pulses palpable bilaterally. Posterior tibial pulses palpable bilaterally. Capillary refill normal to all digits.  No cyanosis or clubbing noted. Pedal hair growth normal.  Neurologic Normal speech. Oriented to person, place, and time. Epicritic sensation to light touch grossly present bilaterally.  Dermatologic Nails well groomed and normal in appearance. No open  wounds. No skin lesions.  Orthopedic:  Pain on palpation to the left dorsal midfoot.  Pain with range of motion of the midtarsal joints.  Pain circumferential around the midfoot.  No focalized localized pain noted.  Negative pain with dorsiflexion of the digits no pain with plantarflexion of the digits.  Negative piano key test.  Lisfranc in regularly noted   Radiographs: 3 views of skeletally mature adult Bilateral foot: Midfoot arthritis noted.  Posterior spurring noted.  Osteoarthritic changes noted to the first metatarsophalangeal joint.  No other bony abnormalities identified. Assessment:   1. Arthritis of midfoot    Plan:  Patient was evaluated and treated and all questions answered.  Bilateral midfoot arthritis with left greater than right -I explained patient the etiology of arthritis and various treatment options were extensively discussed.  Patient may be undergoing an arthritic flare this leading to a lot of pain especially on the left foot.  Given the amount of pain that he is feeling without a way to localize it I believe patient would benefit from cam boot immobilization.  He agrees with the plan like to proceed with cam boot immobilization.  Once her pain has been localized I can also perform steroid injection to help decrease the residual pain.  Patient agrees with the plan. -Sample was dispensed  No follow-ups on file.

## 2020-06-19 ENCOUNTER — Other Ambulatory Visit (HOSPITAL_COMMUNITY): Payer: Self-pay

## 2020-06-29 DIAGNOSIS — N1832 Chronic kidney disease, stage 3b: Secondary | ICD-10-CM | POA: Diagnosis not present

## 2020-06-29 DIAGNOSIS — D849 Immunodeficiency, unspecified: Secondary | ICD-10-CM | POA: Diagnosis not present

## 2020-06-29 DIAGNOSIS — R809 Proteinuria, unspecified: Secondary | ICD-10-CM | POA: Diagnosis not present

## 2020-06-29 DIAGNOSIS — Z944 Liver transplant status: Secondary | ICD-10-CM | POA: Diagnosis not present

## 2020-06-29 LAB — BASIC METABOLIC PANEL
BUN: 15 (ref 4–21)
CO2: 22 (ref 13–22)
Chloride: 101 (ref 99–108)
Creatinine: 1.4 — AB (ref 0.6–1.3)
Glucose: 111
Potassium: 4.9 (ref 3.4–5.3)
Sodium: 137 (ref 137–147)

## 2020-06-29 LAB — HEPATIC FUNCTION PANEL
ALT: 19 (ref 10–40)
AST: 18 (ref 14–40)
Alkaline Phosphatase: 108 (ref 25–125)
Bilirubin, Total: 0.5

## 2020-06-29 LAB — CBC AND DIFFERENTIAL
HCT: 44 (ref 41–53)
Hemoglobin: 14.1 (ref 13.5–17.5)
Neutrophils Absolute: 5.5
Platelets: 342 (ref 150–399)
WBC: 7.5

## 2020-06-29 LAB — COMPREHENSIVE METABOLIC PANEL
Albumin: 4.3 (ref 3.5–5.0)
Calcium: 9.4 (ref 8.7–10.7)
GFR calc non Af Amer: 55

## 2020-06-29 LAB — CBC: RBC: 5.69 — AB (ref 3.87–5.11)

## 2020-07-02 DIAGNOSIS — Z95828 Presence of other vascular implants and grafts: Secondary | ICD-10-CM | POA: Diagnosis not present

## 2020-07-03 ENCOUNTER — Other Ambulatory Visit (HOSPITAL_COMMUNITY): Payer: Self-pay

## 2020-07-03 ENCOUNTER — Other Ambulatory Visit: Payer: Self-pay

## 2020-07-03 DIAGNOSIS — K760 Fatty (change of) liver, not elsewhere classified: Secondary | ICD-10-CM | POA: Insufficient documentation

## 2020-07-03 DIAGNOSIS — K921 Melena: Secondary | ICD-10-CM | POA: Insufficient documentation

## 2020-07-07 ENCOUNTER — Other Ambulatory Visit (HOSPITAL_COMMUNITY): Payer: Self-pay

## 2020-07-07 ENCOUNTER — Encounter: Payer: Self-pay | Admitting: Family Medicine

## 2020-07-07 ENCOUNTER — Ambulatory Visit: Payer: 59 | Admitting: Family Medicine

## 2020-07-07 ENCOUNTER — Other Ambulatory Visit: Payer: Self-pay

## 2020-07-07 VITALS — BP 164/79 | HR 71 | Temp 97.7°F | Resp 16 | Ht 68.75 in | Wt 223.0 lb

## 2020-07-07 DIAGNOSIS — N183 Chronic kidney disease, stage 3 unspecified: Secondary | ICD-10-CM

## 2020-07-07 DIAGNOSIS — I10 Essential (primary) hypertension: Secondary | ICD-10-CM | POA: Diagnosis not present

## 2020-07-07 DIAGNOSIS — I5032 Chronic diastolic (congestive) heart failure: Secondary | ICD-10-CM | POA: Diagnosis not present

## 2020-07-07 LAB — TACROLIMUS, BLOOD: Tacrolimus Lvl: 5.4

## 2020-07-07 LAB — EVEROLIMUS: Everolimus Lvl: 7.6 ng/mL

## 2020-07-07 MED ORDER — IRBESARTAN 75 MG PO TABS
75.0000 mg | ORAL_TABLET | Freq: Every day | ORAL | 0 refills | Status: DC
  Filled 2020-07-07: qty 30, 30d supply, fill #0

## 2020-07-07 NOTE — Progress Notes (Signed)
OFFICE VISIT  07/07/2020  CC:  Chief Complaint  Patient presents with  . Elevated blood pressure    Ongoing issue for the past 4 months, has gotten worse in the last month. Average BP is 160/80-90, manually taken by wife at home daily.    HPI:    Patient is a 70 y.o. Caucasian male who presents for blood pressure elevation. I last saw him 11/25/19. A/P as of that visit: "1) HTN: home monitoring shows excellent control. Continue lopressor 19m, 1 qam and 1/2 qpm.  2) DM:  Diet.  Glucoses have remained good throughout this entire 2 yr period of medical difficulties for him. A1c today.  3) CRI III: monitor sCr today, lytes.  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  Will check CMET today.   5) Chronic GIB: seems to have resolved since liver transplant: Hb stable (14 on 10/24/19), he is off sandostatin and lysteda, not requiring IV iron. No site for this bleeding was ever located despite extensive endoscopic eval.  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 He is s/p pulm rehab.  [facility] sure they monitor this. No oxygen needed.  7) Health maintenance exam: Reviewed age and gender appropriate health maintenance issues (prudent diet, regular exercise, health risks of tobacco and excessive alcohol, use of seatbelts, fire alarms in home, use of sunscreen).  Also reviewed age and gender appropriate health screening as well as vaccine recommendations. Vaccines:flu vaccine today.  He is otherwise all UTD.  Shingrix deferred until (possibly) next year. Labs: CMET, A1c, PSA.  Prostate ca screening: PSA today. Colon ca screening: last colonoscopy was 2018 in the context of GIB: no polyps.  Anticipate repeat 2023."  INTERIM HX: Since I last saw him we've d/c'd his chlorthalidone b/c it was causing gout.  Also titrated his  lopressor up to 536mbid. Feet and elbows gout sx's have resolved.  Says he feels well.  Appetite good, LL's and ankles a little swollen --says this has not been a problem for him in the past. Not exercising at all but not sedentary.  Not checking glucoses anymore. Last 4-6 wks bp's running 130s/80s up to 160s/90s.  HR 70s. Tries to eat low Na diet.  Reviewed lab results from recent liver transplant clinic f/u (06/29/20):  Gluc 111, BUN 15, sCr 1.37 (GFR 55), K 4.9, Na 137, Chloride 101, CO2 22, Ca 9.4, T Pro 6.5, Alb 4.3, T bili 0.5, Alk phos 108, AST 18, ALT 19, WBC 7.5K, Hb 14.1, plt 342K, magnesium 1.9, Tacrolimus level 5.4, everolimus level 7.6. Urine protein/cr 253 (ULN is 200).  ROS as above, plus--> no fevers, no CP, no SOB, no wheezing, no cough, no dizziness, no HAs, no rashes, no melena/hematochezia.  No polyuria or polydipsia.  No myalgias or arthralgias.  No focal weakness, paresthesias, or tremors.  No acute vision or hearing abnormalities.  No dysuria or unusual/new urinary urgency or frequency.  No n/v/d or abd pain.  No palpitations.     Past Medical History:  Diagnosis Date  . AAA [place]abdominal aortic aneurysm) [place]   repaired 2020  . Ascending aortic aneurysm (HCC)    unrepaired. [facility] F/u w/vasc surg 2022.  . Bilateral renal cysts    simple (03/2014 MRI)  . CAD (coronary artery disease)   . Cholelithiases 2018   asymptomatic  .  Chronic diastolic heart failure ([place])   . COVID-19 virus infection 02/2020   sotrovimab infusion  . Diabetes mellitus with complication ([place]) 48/1856   A1c 6.8%. A1c 5.4% 11/2019  . Gout    initially dx'd by diag arthrocentesis of elbow effusion in winter 2021, 3 episodes, all came after being started on chlorthalidone->I d/c'd chlorthal.  . 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  Improving + off oxygen as of 3 mo s/p liver transplant.  Doing well/resolved as of 02/2020 [facility] pul[facility]  f/u->they'll continue to follow.  . History of blood transfusion 2018 X 4 dates   Chronic GI blood loss of unknown location despite full GI endoscopic eval, etc  . History of liver transplant ([place]) 07/2019   [facility]  . Hyperlipemia, mixed    elevated LFTs when on statins.    . Hypertension    Cr bump 04/01/16 so I changed benicar-hct to benicar plain and added amlodipine 5 mg.  . Increased prostate specific antigen (PSA) velocity 2021   0.11 to 4.77 from 2020 to 2021 (he got a liver transplant and was put on antirejection meds between these psa checks).  . Iron deficiency anemia 05/2016   Acute blood loss anemia: hospitalized, required transfusion x 3 U: colonoscopy and capsule study unrevealing.  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  W/u for ? hemolytic anemia to be pursued by hematologist as outpt.  Dr. [name], GI at [facility] following, too---he rec'd onc do bone marrow bx as of Jan 2019  . Iron deficiency anemia due to chronic blood loss 2018/19   GI: transfusions x >20 required; multiple endoscopies and bleeding scans unrevealing. Lysteda and octreotide + monthly iron infusions as of 04/2017. Iron infusions changed to every other month as of 09/2018 hem f/u.  [name] Liver cirrhosis secondary to NASH ([place]) 05/2016   Liver transplant 07/2019  . Liver failure ([place]) 2020   NASH cirrhosis  . Lung field abnormal finding on examination    Bibasilar L>R mild insp crackles-->x-ray showed mild interstitial changes/fibrosis/scarring.  Changes noted on all CXRs in 2018/2019.  Liver Transplant eval 03/2018->mild restriction on PFTs but no obstruction.  See further details in [place] "pulm fibrosis" section  . Microscopic hematuria    Eval unremarkable by Dr. Eulogio Ditch.  . 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 Obesity   . Pulmonary fibrosis (HCC)    PFTs: restrictive lung dz ([facility] eval  05/2018). [facility] eval felt this was likely hepatopulmonary syndrome->causing his hypoxia->low likelihood of progression (as of 10/04/18 transplant clinic f/u). Pulm rehab helping as of 2020/2021. OFF oxygen as of 10/2019 transp f/u.  [name] Spleen enlarged     Past Surgical History:  Procedure Laterality Date  . ABDOMINAL AORTIC ANEURYSM REPAIR  08/23/2018   [facility]  . CARDIAC CATHETERIZATION    . CARDIOVASCULAR STRESS TEST  01/17/2012; 05/04/16   Normal stress nuclear study x 2 (2018--Normal perfusion. LVEF 66% with normal wall motion. This is a low risk study).  . CERVICAL DISCECTOMY  1992  . COLONOSCOPY N/A 05/27/2016   No site/explanation for blood loss found.  Erythematous mucosa in cecum and ascending colon--Cecal bx: normal.  Procedure: COLONOSCOPY;  Surgeon: Carol Ada, MD;  Location[facility] ENDOSCOPY;  Service: Endoscopy;  Laterality: N/A;  . COLONOSCOPY W/ POLYPECTOMY  09/10/2014   Polypectomy x 2: recall 5 yrs (Dr. [name]).  . CORONARY ANGIOPLASTY    .  CORONARY ARTERY BYPASS GRAFT  03/2006  . CT ABDOMEN WITH CONTRAST ([place] HX)  11/2018  . CT CHEST WWO CONTAST ([place] HX)  07/11/2019   No significant change in interstitial lung disease with possible underlying hepatopulmonary syndrome, Increased ascites in the setting of cirrhosis, tiny new right basilar nodule  . DEXA  03/07/2019   T score 0.2/NORMAL  . ENTEROSCOPY N/A 07/31/2016   Procedure: ENTEROSCOPY;  Surgeon: Ladene Artist, MD;  Location[facility] ENDOSCOPY;  Service: Endoscopy;  Laterality: N/A;  . ENTEROSCOPY N/A 12/02/2016   Procedure: ENTEROSCOPY;  Surgeon: Carol Ada, MD;  Location: [facility] ENDOSCOPY;  Service: Endoscopy;  Laterality: N/A;  . ESOPHAGOGASTRODUODENOSCOPY N/A 05/25/2016   No site/explanation for blood loss found.  Procedure: ESOPHAGOGASTRODUODENOSCOPY (EGD);  Surgeon: Juanita Craver, MD;  Location: [facility] ENDOSCOPY;  Service: Endoscopy;  Laterality: N/A;  . ESOPHAGOGASTRODUODENOSCOPY N/A 09/23/2016   Procedure:  ESOPHAGOGASTRODUODENOSCOPY (EGD);  Surgeon: Carol Ada, MD;  Location: [place];  Service: Endoscopy;  Laterality: N/A;  . ESOPHAGOGASTRODUODENOSCOPY (EGD) WITH PROPOFOL N/A 08/21/2018   Procedure: ESOPHAGOGASTRODUODENOSCOPY (EGD) WITH PROPOFOL;  Surgeon: Carol Ada, MD;  Location: [facility] ENDOSCOPY;  Service: Endoscopy;  Laterality: N/A;  . GIVENS CAPSULE STUDY N/A 05/27/2016   No source identified.  Repeat 10/2016--results pending.  Procedure: GIVENS CAPSULE STUDY;  Surgeon: Carol Ada, MD;  Location: [facility] ENDOSCOPY;  Service: Endoscopy;  Laterality: N/A;  . GIVENS CAPSULE STUDY N/A 10/15/2016   Procedure: GIVENS CAPSULE STUDY;  Surgeon: Carol Ada, MD;  Location: [facility] ENDOSCOPY;  Service: Endoscopy;  Laterality: N/A;  . GIVENS CAPSULE STUDY N/A 02/13/2017   Procedure: GIVENS CAPSULE STUDY;  Surgeon: Carol Ada, MD;  Location: [place];  Service: Endoscopy;  Laterality: N/A;  . LIVER TRANSPLANT  08/02/2019   For NASH cirrhosis. [place]  . NECK SURGERY  1991  . NM GI BLOOD LOSS  01/27/2017   NEGATIVE  . SMALL BOWEL ENTEROSCOPY  10/2016   ([facility], Dr. [name]: Double balloon enteroscopy--to the level of proximal ileum) jejunal polyps- which were removed--not bleeding & benign.  No source of bleeding was identified.  . TRANSTHORACIC ECHOCARDIOGRAM  05/25/2016; 10/2017   05/2016: EF 60%, normal wall motion, grd II DD, mild aortic stenosis, dilated aortic root and ascending aorta. 10/2017: Mild LVH; no mention made of LV function.  Moderate aorticstenosis with mean gradient 19 peak gradient 41. AVA 1.1 cm^2.  05/2018 EF 55%,4.3 cm ascend aneur, mild AS.  [name] VASECTOMY  1977    Outpatient Medications Prior to Visit  Medication Sig Dispense Refill  . aspirin EC 81 MG tablet Take 81 mg by mouth daily. Swallow whole.    . calcium citrate-vitamin D (CITRACAL+D) 315-200 MG-UNIT tablet Take by mouth.    . metoprolol tartrate (LOPRESSOR) 50 MG tablet TAKE 1 TABLET BY MOUTH 2 TIMES DAILY 180 tablet 3  .  pantoprazole (PROTONIX) 40 MG tablet TAKE 1 TABLET BY MOUTH ONCE A DAY 30 tablet 5  . predniSONE (DELTASONE) 5 MG tablet 1/2 tab po qd 15 tablet 0  . tacrolimus (PROGRAF) 1 MG capsule TAKE 3 CAPSULES BY MOUTH EVERY MORNING AND 3 CAPSULES EVERY EVENING. (Patient taking differently: TAKE 2 CAPSULES BY MOUTH EVERY MORNING AND 1 CAPSULE AT BEDTIME.) 180 capsule 11  . triamcinolone (KENALOG) 0.1 % APPLY TO THE RASH 2 TIMES DAILY AS DIRECTED. FOLLOW WITH CERAVE CREAM. STOP WHEN SMOOTH 80 g 2  . ZORTRESS 1 MG TABS TAKE 2 TABLETS (2 MG) BY MOUTH EVERY MORNING, THEN 1 TABLET (1MG) NIGHTLY. 90 tablet 11  .  chlorthalidone (HYGROTON) 25 MG tablet TAKE 1/2 TABLET BY MOUTH DAILY 45 tablet 3  . Everolimus 1 MG TABS TAKE 2 TABLETS BY MOUTH EVERY MORNING AND 2 TABLETS EVERY EVENING. 120 tablet 11  . fluorouracil (EFUDEX) 5 % cream APPLY TO AFFECTED AREAS TWICE A DAY FOR TWO WEEKS. EXPECT REDNESS AND IRRITATION. (Patient not taking: No sig reported) 40 g 1  . lactulose, encephalopathy, (CHRONULAC) 10 GM/15ML SOLN 30 ml (Patient not taking: Reported on 07/07/2020)    . Magnesium Oxide 400 (240 Mg) MG TABS Take 1 tablet by mouth 2 (two) times daily. (Patient not taking: Reported on 07/07/2020)    . Magnesium Oxide 400 (240 Mg) MG TABS TAKE 1 TABLET BY MOUTH TWO TIMES DAILY 60 tablet 11  . tretinoin (RETIN-A) 0.025 % cream APPLY A THIN LAYER (PEA SIZED AMOUNT) TO FACE AT NIGHT. WEAR MOISTURIZER AND SUNSCREEN IN THE AM. (Patient not taking: Reported on 07/07/2020) 45 g 3   Facility-Administered Medications Prior to Visit  Medication Dose Route Frequency Provider Last Rate Last Admin  . heparin lock flush 100 unit/mL  500 Units Intracatheter Daily PRN Truitt Merle, MD      . sodium chloride flush (NS) 0.9 % injection 10 mL  10 mL Intracatheter PRN Truitt Merle, MD        Allergies  Allergen Reactions  . Niacinamide Itching    Improved upon discontinuation   . Thiazide-Type Diuretics Other (See Comments)    Chlorthalidone  caused gout  . Other     Other reaction(s): Unknown  . Statins Other (See Comments)    Liver enzymes go up whenever on them    ROS As per HPI  PE: Vitals with BMI 07/07/2020 03/05/2020 03/05/2020  Height 5' 8.75" - -  Weight 223 lbs - -  BMI 56.38 - -  Systolic 756 433 295  Diastolic 79 92 97  Pulse 71 101 91    Gen: Alert, well appearing.  Patient is oriented to person, place, time, and situation. AFFECT: pleasant, lucid thought and speech. CV: RRR, 2/6 syst murmur at R & L USB, no diastolic murmur, no r/g. Chest is clear, no wheezing.  Soft, "dry" insp crackles at bases (R>L). Normal symmetric air entry throughout both lung fields. No chest wall deformities or tenderness. EXT: no clubbing or cyanosis.  2+ pretibial pitting edema.    LABS:    Chemistry      Component Value Date/Time   NA 129 (L) 11/27/2019 0938   NA 132 (A) 10/24/2019 0000   NA 139 02/10/2017 0948   K 4.1 11/27/2019 0938   K 3.9 02/10/2017 0948   CL 96 11/27/2019 0938   CO2 24 11/27/2019 0938   CO2 24 02/10/2017 0948   BUN 12 11/27/2019 0938   BUN 16 10/24/2019 0000   BUN 10.4 02/10/2017 0948   CREATININE 1.07 11/27/2019 0938   CREATININE 1.43 (H) 05/02/2019 0847   CREATININE 1.3 02/10/2017 0948   GLU 103 10/24/2019 0000      Component Value Date/Time   CALCIUM 8.9 11/27/2019 0938   CALCIUM 8.9 02/10/2017 0948   ALKPHOS 65 11/27/2019 0938   ALKPHOS 108 02/10/2017 0948   AST 18 11/27/2019 0938   AST 31 05/02/2019 0847   AST 30 02/10/2017 0948   ALT 11 11/27/2019 0938   ALT 25 05/02/2019 0847   ALT 23 02/10/2017 0948   BILITOT 1.0 11/27/2019 0938   BILITOT 8.1 (HH) 05/02/2019 0847   BILITOT 4.97 ([place]) 02/10/2017 1884  Lab Results  Component Value Date   WBC 13.1 01/27/2020   HGB 13.5 01/27/2020   HCT 42 01/27/2020   MCV 98.5 06/27/2019   PLT 304 01/27/2020   Lab Results  Component Value Date   CHOL 162 01/25/2017   HDL 47 01/25/2017   LDLCALC 100 (H) 01/25/2017   TRIG 76  01/25/2017   CHOLHDL 3.4 01/25/2017   Lab Results  Component Value Date   TSH 1.19 05/04/2016   Lab Results  Component Value Date   HGBA1C 5.4 11/27/2019   Lab Results  Component Value Date   PSA1 0.2 07/28/2017   PSA 1.06 03/23/2020   PSA 4.27 (H) 11/27/2019   PSA 0.11 05/15/2018   IMPRESSION AND PLAN:  1) Uncontrolled HTN in the setting of chronic diastolic HF, CRI III, hx of CAD, hx of thoracic and abd aneurisms, and hx of liver transplant. Plan is to continue lopressor 64m bid and add low dose ARB-->taking care to watch sCr and K closely.  No diuretic at this time but may do this in near future if LE edema remains despite good bp control and/or if potassium retention becomes an issue. Reviewed 06/29/20 transplant clinic labs in detail today: all normal except sCr 1.37 (GFR 55), which is his baseline. Encouraged increased activity, low Na diet, try to restrict calories some.  An After Visit Summary was printed and given to the patient.  FOLLOW UP: Return for 7-10d f/u HTN, CRI, and check bmet. cpe 630moSigned:  PhCrissie SicklesMD           07/07/2020

## 2020-07-08 ENCOUNTER — Other Ambulatory Visit (HOSPITAL_COMMUNITY): Payer: Self-pay

## 2020-07-08 MED FILL — Everolimus Tab 1 MG: ORAL | Qty: 120 | Fill #0 | Status: CN

## 2020-07-08 MED FILL — Tacrolimus Cap 1 MG: ORAL | 30 days supply | Qty: 180 | Fill #1 | Status: AC

## 2020-07-08 MED FILL — Everolimus Tab 1 MG: ORAL | 30 days supply | Qty: 90 | Fill #1 | Status: AC

## 2020-07-15 ENCOUNTER — Ambulatory Visit (INDEPENDENT_AMBULATORY_CARE_PROVIDER_SITE_OTHER): Payer: 59 | Admitting: Podiatry

## 2020-07-15 ENCOUNTER — Encounter: Payer: Self-pay | Admitting: Hematology

## 2020-07-15 ENCOUNTER — Other Ambulatory Visit: Payer: Self-pay

## 2020-07-15 DIAGNOSIS — M19079 Primary osteoarthritis, unspecified ankle and foot: Secondary | ICD-10-CM | POA: Diagnosis not present

## 2020-07-16 ENCOUNTER — Encounter: Payer: Self-pay | Admitting: Family Medicine

## 2020-07-16 ENCOUNTER — Ambulatory Visit: Payer: 59 | Admitting: Family Medicine

## 2020-07-16 VITALS — BP 123/74 | HR 72 | Temp 97.3°F | Resp 16 | Ht 68.75 in | Wt 223.0 lb

## 2020-07-16 DIAGNOSIS — N183 Chronic kidney disease, stage 3 unspecified: Secondary | ICD-10-CM | POA: Diagnosis not present

## 2020-07-16 DIAGNOSIS — I10 Essential (primary) hypertension: Secondary | ICD-10-CM | POA: Diagnosis not present

## 2020-07-16 DIAGNOSIS — D849 Immunodeficiency, unspecified: Secondary | ICD-10-CM | POA: Diagnosis not present

## 2020-07-16 DIAGNOSIS — Z944 Liver transplant status: Secondary | ICD-10-CM

## 2020-07-16 DIAGNOSIS — N2889 Other specified disorders of kidney and ureter: Secondary | ICD-10-CM

## 2020-07-16 LAB — BASIC METABOLIC PANEL
BUN: 17 (ref 4–21)
CO2: 21 (ref 13–22)
Chloride: 99 (ref 99–108)
Creatinine: 1.4 — AB (ref 0.6–1.3)
Glucose: 119
Potassium: 5.4 — AB (ref 3.4–5.3)
Sodium: 137 (ref 137–147)

## 2020-07-16 LAB — HEPATIC FUNCTION PANEL
ALT: 22 (ref 10–40)
Alkaline Phosphatase: 125 (ref 25–125)
Bilirubin, Total: 0.5

## 2020-07-16 LAB — CBC AND DIFFERENTIAL
HCT: 45 (ref 41–53)
Hemoglobin: 14.6 (ref 13.5–17.5)
Neutrophils Absolute: 6.1
Platelets: 286 (ref 150–399)
WBC: 8.5

## 2020-07-16 LAB — COMPREHENSIVE METABOLIC PANEL
Albumin: 4.5 (ref 3.5–5.0)
Calcium: 9.7 (ref 8.7–10.7)
GFR calc non Af Amer: 56

## 2020-07-16 LAB — CBC: RBC: 5.81 — AB (ref 3.87–5.11)

## 2020-07-16 NOTE — Progress Notes (Signed)
Left  Subjective:  Patient ID: William [name], male    DOB: [birth year],  MRN: [id number]  Chief Complaint  Patient presents with   Arthritis    Bilateral  PT stated that he is doing a little better he stated that he still has some soreness in his feet mostly at night     70 y.o. male presents with the above complaint.  Patient presents with follow-up of dorsal midfoot flareup/arthritis.  Patient states doing a lot better does not any pain the cam boot helped considerably.  He denies any other acute complaints.  He would like to know if he can return to regular activities.   Review of Systems: Negative except as noted in the HPI. Denies N/V/F/Ch.  Past Medical History:  Diagnosis Date   AAA (abdominal aortic aneurysm) ([place]) 03/2014   repaired 2020   Ascending aortic aneurysm (HCC)    unrepaired. Stable at 4.3 cm on imaging by [facility] thoracic surg 10/2019. F/u w/vasc surg 2022.   Bilateral renal cysts    simple (03/2014 MRI)   CAD (coronary artery disease)    Cholelithiases 2018   asymptomatic   Chronic diastolic heart failure (HCC)    Chronic renal insufficiency, stage 3 (moderate) ([place]) 2022   baseline sCr 1.3-1.5 (GFR 55)   COVID-19 virus infection 02/2020   sotrovimab infusion   Diabetes mellitus with complication ([place]) 64/3329   A1c 6.8%. A1c 5.4% 11/2019   Gout    initially dx'd by diag arthrocentesis of elbow effusion in winter 2021, 3 episodes, all came after being started on chlorthalidone->I d/c'd chlorthal.   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  Improving + off oxygen as of 3 mo s/p liver transplant.  Doing well/resolved as of 02/2020 [facility] pulm f/u->they'll continue to follow.   History of blood transfusion 2018 X 4 dates   Chronic GI blood loss of unknown location despite full GI endoscopic eval, etc   History of liver transplant ([place]) 07/2019   [facility]   Hyperlipemia, mixed    elevated LFTs when on statins.     Hypertension    Cr bump 04/01/16 so I changed  benicar-hct to benicar plain and added amlodipine 5 mg.   Increased prostate specific antigen (PSA) velocity 2021   0.11 to 4.77 from 2020 to 2021 (he got a liver transplant and was put on antirejection meds between these psa checks).   Iron deficiency anemia 05/2016   Acute blood loss anemia: hospitalized, required transfusion x 3 U: colonoscopy and capsule study unrevealing.  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  W/u for ? hemolytic anemia to be pursued by hematologist as outpt.  Dr. [name], GI at [facility] following, too---he rec'd onc do bone marrow bx as of Jan 2019   Iron deficiency anemia due to chronic blood loss 2018/19   GI: transfusions x >20 required; multiple endoscopies and bleeding scans unrevealing. Lysteda and octreotide + monthly iron infusions as of 04/2017. Iron infusions changed to every other month as of 09/2018 hem f/u.   Liver cirrhosis secondary to NASH ([place]) 05/2016   Liver transplant 07/2019   Liver failure ([place]) 2020   NASH cirrhosis   Lung field abnormal finding on examination    Bibasilar L>R mild insp crackles-->x-ray showed mild interstitial changes/fibrosis/scarring.  Changes noted on all CXRs in 2018/2019.  Liver Transplant eval 03/2018->mild restriction on PFTs but no obstruction.  See further  details in [place] "pulm fibrosis" section   Microscopic hematuria    Eval unremarkable by Dr. Eulogio Ditch.   Nonmelanoma skin cancer 08/15/2018   BCC nose, excised   Obesity    Pulmonary fibrosis ([place])    PFTs: restrictive lung dz ([facility] transplant eval 05/2018). [facility]m eval felt this was likely hepatopulmonary syndrome->causing his hypoxia->low likelihood of progression (as of 10/04/18 transplant clinic f/u). Pulm rehab helping as of 2020/2021. OFF oxygen as of 10/2019 transp f/u.   Spleen enlarged     Current Outpatient Medications:    aspirin EC 81 MG tablet, Take 81 mg by mouth  daily. Swallow whole., Disp: , Rfl:    calcium citrate-vitamin D (CITRACAL+D) 315-200 MG-UNIT tablet, Take by mouth., Disp: , Rfl:    irbesartan (AVAPRO) 75 MG tablet, Take 1 tablet (75 mg total) by mouth daily., Disp: 30 tablet, Rfl: 0   metoprolol tartrate (LOPRESSOR) 50 MG tablet, TAKE 1 TABLET BY MOUTH 2 TIMES DAILY, Disp: 180 tablet, Rfl: 3   pantoprazole (PROTONIX) 40 MG tablet, TAKE 1 TABLET BY MOUTH ONCE A DAY, Disp: 30 tablet, Rfl: 5   predniSONE (DELTASONE) 5 MG tablet, 1/2 tab po qd, Disp: 15 tablet, Rfl: 0   tacrolimus (PROGRAF) 1 MG capsule, TAKE 3 CAPSULES BY MOUTH EVERY MORNING AND 3 CAPSULES EVERY EVENING. (Patient taking differently: TAKE 2 CAPSULES BY MOUTH EVERY MORNING AND 1 CAPSULE AT BEDTIME.), Disp: 180 capsule, Rfl: 11   triamcinolone (KENALOG) 0.1 %, APPLY TO THE RASH 2 TIMES DAILY AS DIRECTED. FOLLOW WITH CERAVE CREAM. STOP WHEN SMOOTH, Disp: 80 g, Rfl: 2   ZORTRESS 1 MG TABS, TAKE 2 TABLETS (2 MG) BY MOUTH EVERY MORNING, THEN 1 TABLET (1MG) NIGHTLY., Disp: 90 tablet, Rfl: 11 No current facility-administered medications for this visit.  Facility-Administered Medications Ordered in Other Visits:    heparin lock flush 100 unit/mL, 500 Units, Intracatheter, Daily PRN, Truitt Merle, MD   sodium chloride flush (NS) 0.9 % injection 10 mL, 10 mL, Intracatheter, PRN, Truitt Merle, MD  Social History   Tobacco Use  Smoking Status Former   Pack years: 0.00   Types: Cigarettes  Smokeless Tobacco Never  Tobacco Comments   QUIT I 1983    Allergies  Allergen Reactions   Niacinamide Itching    Improved upon discontinuation    Thiazide-Type Diuretics Other (See Comments)    Chlorthalidone caused gout   Other     Other reaction(s): Unknown   Statins Other (See Comments)    Liver enzymes go up whenever on them   Objective:  There were no vitals filed for this visit. There is no height or weight on file to calculate BMI. Constitutional Well developed. Well nourished.   Vascular Dorsalis pedis pulses palpable bilaterally. Posterior tibial pulses palpable bilaterally. Capillary refill normal to all digits.  No cyanosis or clubbing noted. Pedal hair growth normal.  Neurologic Normal speech. Oriented to person, place, and time. Epicritic sensation to light touch grossly present bilaterally.  Dermatologic Nails well groomed and normal in appearance. No open wounds. No skin lesions.  Orthopedic: No pain on palpation to the left dorsal midfoot.  Now pain with range of motion of the midtarsal joints.  No pain circumferential around the midfoot.  No focalized localized pain noted.  Negative pain with dorsiflexion of the digits no pain with plantarflexion of the digits.  Negative piano key test.  [place] in regularly noted   Radiographs: 3 views of skeletally mature adult Bilateral foot: Midfoot arthritis  noted.  Posterior spurring noted.  Osteoarthritic changes noted to the first metatarsophalangeal joint.  No other bony abnormalities identified. Assessment:   1. Arthritis of midfoot     Plan:  Patient was evaluated and treated and all questions answered.  Bilateral midfoot arthritis with left greater than right -Clinically healed with cam boot immobilization.  At this time we will hold off on any steroid injection.  I discussed shoe gear modification with him in extensive detail he states understanding.  If any foot and ankle issues arise in future I will asked him to come and see me.  No follow-ups on file.

## 2020-07-16 NOTE — Patient Instructions (Signed)
Continue to monitor blood pressure and heart rate every 1-2 days. Goal for top number is <130 average, goal for bottom number is <80 average.

## 2020-07-16 NOTE — Progress Notes (Signed)
OFFICE VISIT  07/16/2020  CC:  Chief Complaint  Patient presents with   Follow-up    HTN, CRI.    HPI:    Patient is a 70 y.o. Caucasian male who presents for 10 day f/u uncontrolled HTN in the setting of CRI III (baseline sCr 1.3-1.5) and history of liver transplant. A/P as of last visit: "1) Uncontrolled HTN in the setting of chronic diastolic HF, CRI III, hx of CAD, hx of thoracic and abd aneurisms, and hx of liver transplant. Plan is to continue lopressor 39m bid and add low dose ARB (Irbesartan 750mqd)-->taking care to watch sCr and K closely.  No diuretic at this time but may do this in near future if LE edema remains despite good bp control and/or if potassium retention becomes an issue. Reviewed 06/29/20 transplant clinic labs in detail today: all normal except sCr 1.37 (GFR 55), which is his baseline. Encouraged increased activity, low Na diet, try to restrict calories some."  INTERIM HX: Feeling well, no side effects from irbesartan.  Home bp's reviewed: a few 120s syst, a few 130s, a couple 140s.  Diastolics low [id number] typically 68-72. He eats a low Na diet.  Next [facility] transplant clinic f/u 08/06/20. He had cmet and tacrolimus level drawn for that visit today.   Past Medical History:  Diagnosis Date   AAA (abdominal aortic aneurysm) (HC[facility]02/2016   repaired 2020   Ascending aortic aneurysm (HCC)    unrepaired. Stable at 4.3 cm on [facility]. F/u w/vasc surg 2022.   Bilateral renal cysts    simple (03/2014 MRI)   CAD (coronary artery disease)    Cholelithiases 2018   asymptomatic   Chronic diastolic heart failure (HCC)    Chronic renal insufficiency, stage 3 (moderate) (HCWaters[place]2022   baseline sCr 1.3-1.5 (GFR 55)   COVID-19 virus infection 02/2020   sotrovimab infusion   Diabetes mellitus with complication (HCSabin0631/5176 A1c 6.8%. A1c 5.4% 11/2019   Gout    initially dx'd by diag arthrocentesis of elbow effusion in winter 2021, 3  episodes, all came after being started on chlorthalidone->I d/c'd chlorthal.   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  Improving + off oxygen as of 3 mo s/p liver transplant.  Doing well/resolved as of 02/2020 [facility] pulm f/u->they'll continue to follow.   History of blood transfusion 2018 X 4 dates   Chronic GI blood loss of unknown location despite full GI endoscopic eval, etc   [place] [facility] liver [facility] ([facility]   Hyperlipemia, mixed    elevated LFTs when on statins.     Hypertension    Cr bump 04/01/16 so I changed benicar-hct to benicar plain and added amlodipine 5 mg.   Increased prostate specific antigen (PSA) velocity 2021   0.11 to 4.77 from 2020 to 2021 (he got a liver transplant and was put on antirejection meds between these psa checks).   Iron deficiency anemia 05/2016   Acute blood loss anemia: hospitalized, required transfusion x 3 U: colonoscopy and capsule study unrevealing.  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  W/u for ? hemolytic anemia to be pursued by hematologist as outpt.  [facility]he rec'd onc do bone marrow bx as of Jan 2019   Iron deficiency anemia due to chronic blood loss 2018/19   GI: transfusions  x >20 required; multiple endoscopies and bleeding scans unrevealing. Lysteda and octreotide + monthly iron infusions as of 04/2017. Iron infusions changed to every other month as of 09/2018 hem f/u.   Liver cirrhosis secondary to NASH ([place]) 05/2016   Liver transplant 07/2019   Liver failure ([place]) 2020   NASH cirrhosis   Lung field abnormal finding on examination    Bibasilar L>R mild insp crackles-->x-ray showed mild interstitial changes/fibrosis/scarring.  Changes noted on all CXRs in 2018/2019.  Liver Transplant eval 03/2018->mild restriction on PFTs but no obstruction.  See further details in [facility] "pulm fibrosis" section   Microscopic  hematuria    Eval unremarkable by Dr. Eulogio Ditch.   Nonmelanoma skin cancer 08/15/2018   BCC nose, excised   Obesity    Pulmonary fibrosis ([place])    PFTs: restrictive lung dz ([facility] transplant eval 05/2018). [facility] pulm eval felt this was likely hepatopulmonary syndrome->causing his hypoxia->low likelihood of progression (as of 10/04/18 transplant clinic f/u). Pulm rehab helping as of 2020/2021. OFF oxygen as of 10/2019 transp f/u.   Spleen enlarged     Past Surgical History:  Procedure Laterality Date   ABDOMINAL AORTIC ANEURYSM REPAIR  08/23/2018   [facility]   CARDIAC CATHETERIZATION     CARDIOVASCULAR STRESS TEST  01/17/2012; 05/04/16   Normal stress nuclear study x 2 (2018--Normal perfusion. LVEF 66% with normal wall motion. This is a low risk study).   CERVICAL DISCECTOMY  1992   COLONOSCOPY N/A 05/27/2016   No site/explanation for blood loss found.  Erythematous mucosa in cecum and ascending colon--Cecal bx: normal.  Procedure: COLONOSCOPY;  Surgeon: Carol Ada, MD;  [facility] END[facility]COPY;  Service: Endoscopy;  Laterality: N/A;   COLONOSCOPY W/ POLYPECTOMY  09/10/2014   Polypectomy x 2: recall 5 yrs (Dr. [name]).   CORONARY ANGIOPLASTY     CORONARY ARTERY BYPASS GRAFT  03/2006   CT ABDOMEN WITH CONTRAST ([place] HX)  11/2018   CT CHEST WWO CONTAST ([place] HX)  07/11/2019   No significant change in interstitial lung disease with possible underlying hepatopulmonary syndrome, Increased ascites in the setting of cirrhosis, tiny new right basilar nodule   DEXA  03/07/2019   T score 0.2/NORMAL   ENTEROSCOPY N/A 07/31/2016   Procedure: ENTEROSCOPY;  Surgeon: Ladene Artist, MD;  Location: [facility] ENDOSCOPY;  Service: Endoscopy;  Laterality: N/A;   ENTEROSCOPY N/A 12/02/2016   Procedure: ENTEROSCOPY;  Surgeon: Carol Ada, MD;  Location: [facility] ENDOSCOPY;  Service: Endoscopy;  Laterality: N/A;   ESOPHAGOGASTRODUODENOSCOPY N/A 05/25/2016   No site/explanation for blood loss found.  Procedure:  ESOPHAGOGASTRODUODENOSCOPY (EGD);  Surgeon: Juanita Craver, MD;  Location: [facility] ENDOSCOPY;  Service: Endoscopy;  Laterality: N/A;   ESOPHAGOGASTRODUODENOSCOPY N/A 09/23/2016   Procedure: ESOPHAGOGASTRODUODENOSCOPY (EGD);  Surgeon: Carol Ada, MD;  Location: [place];  Service: Endoscopy;  Laterality: N/A;   ESOPHAGOGASTRODUODENOSCOPY (EGD) WITH PROPOFOL N/A 08/21/2018   Procedure: ESOPHAGOGASTRODUODENOSCOPY (EGD) WITH PROPOFOL;  Surgeon: Carol Ada, MD;  Location: [facility] ENDOSCOPY;  Service: Endoscopy;  Laterality: N/A;   GIVENS CAPSULE STUDY N/A 05/27/2016   No source identified.  Repeat 10/2016--results pending.  Procedure: GIVENS CAPSULE STUDY;  Surgeon: Carol Ada, MD;  Location: [facility] ENDOSCOPY;  Service: Endoscopy;  Laterality: N/A;   GIVENS CAPSULE STUDY N/A 10/15/2016   Procedure: GIVENS CAPSULE STUDY;  Surgeon: Carol Ada, MD;  Location: [facility] ENDOSCOPY;  Service: Endoscopy;  Laterality: N/A;   GIVENS CAPSULE STUDY N/A 02/13/2017   Procedure: GIVENS CAPSULE STUDY;  Surgeon: Carol Ada, MD;  Location: [place];  Service: Endoscopy;  Laterality: N/A;   LIVER TRANSPLANT  08/02/2019   For NASH cirrhosis. [place]   NM GI BLOOD LOSS  01/27/2017   NEGATIVE   SMALL BOWEL ENTEROSCOPY  10/2016   ([facility], Dr. [name]: Double balloon enteroscopy--to the level of proximal ileum) jejunal polyps- which were removed--not bleeding & benign.  No source of bleeding was identified.   TRANSTHORACIC ECHOCARDIOGRAM  05/25/2016; 10/2017   05/2016: EF 60%, normal wall motion, grd II DD, mild aortic stenosis, dilated aortic root and ascending aorta. 10/2017: Mild LVH; no mention made of LV function.  Moderate aorticstenosis with mean gradient 19 peak gradient 41. AVA 1.1 cm^2.  05/2018 EF 55%,4.3 cm ascend aneur, mild AS.   VASECTOMY  1977    Outpatient Medications Prior to Visit  Medication Sig Dispense Refill   aspirin EC 81 MG tablet Take 81 mg by mouth daily. Swallow whole.     calcium  citrate-vitamin D (CITRACAL+D) 315-200 MG-UNIT tablet Take by mouth.     irbesartan (AVAPRO) 75 MG tablet Take 1 tablet (75 mg total) by mouth daily. 30 tablet 0   metoprolol tartrate (LOPRESSOR) 50 MG tablet TAKE 1 TABLET BY MOUTH 2 TIMES DAILY 180 tablet 3   pantoprazole (PROTONIX) 40 MG tablet TAKE 1 TABLET BY MOUTH ONCE A DAY 30 tablet 5   predniSONE (DELTASONE) 5 MG tablet 1/2 tab po qd 15 tablet 0   tacrolimus (PROGRAF) 1 MG capsule TAKE 3 CAPSULES BY MOUTH EVERY MORNING AND 3 CAPSULES EVERY EVENING. (Patient taking differently: TAKE 2 CAPSULES BY MOUTH EVERY MORNING AND 1 CAPSULE AT BEDTIME.) 180 capsule 11   triamcinolone (KENALOG) 0.1 % APPLY TO THE RASH 2 TIMES DAILY AS DIRECTED. FOLLOW WITH CERAVE CREAM. STOP WHEN SMOOTH 80 g 2   ZORTRESS 1 MG TABS TAKE 2 TABLETS (2 MG) BY MOUTH EVERY MORNING, THEN 1 TABLET (1MG) NIGHTLY. 90 tablet 11   Facility-Administered Medications Prior to Visit  Medication Dose Route Frequency Provider Last Rate Last Admin   heparin lock flush 100 unit/mL  500 Units Intracatheter Daily PRN Truitt Merle, MD       sodium chloride flush (NS) 0.9 % injection 10 mL  10 mL Intracatheter PRN Truitt Merle, MD        Allergies  Allergen Reactions   Niacinamide Itching    Improved upon discontinuation    Thiazide-Type Diuretics Other (See Comments)    Chlorthalidone caused gout   Other     Other reaction(s): Unknown   Statins Other (See Comments)    Liver enzymes go up whenever on them    ROS As per HPI  PE: Vitals with BMI 07/16/2020 07/07/2020 03/05/2020  Height 5' 8.75" 5' 8.75" -  Weight 223 lbs 223 lbs -  BMI 50.35 46.56 -  Systolic 812 751 700  Diastolic 74 79 92  Pulse 72 71 101    Gen: Alert, well appearing.  Patient is oriented to person, place, time, and situation. AFFECT: pleasant, lucid thought and speech. CV: RRR, no m/r/g.   LUNGS: CTA bilat, nonlabored resps, good aeration in all lung fields. EXT: no clubbing or cyanosis.  1+ pitting edema  LLs, L>R---his chronic level.   LABS:  Lab Results  Component Value Date   TSH 1.19 05/04/2016   Lab Results  Component Value Date   WBC 7.5 06/29/2020   HGB 14.1 06/29/2020   HCT 44 06/29/2020   MCV 98.5 06/27/2019   PLT 342 06/29/2020  Lab Results  Component Value Date   CREATININE 1.4 (A) 06/29/2020   BUN 15 06/29/2020   NA 137 06/29/2020   K 4.9 06/29/2020   CL 101 06/29/2020   CO2 22 06/29/2020   Lab Results  Component Value Date   ALT 19 06/29/2020   AST 18 06/29/2020   ALKPHOS 108 06/29/2020   BILITOT 1.0 11/27/2019   Lab Results  Component Value Date   CHOL 162 01/25/2017   Lab Results  Component Value Date   HDL 47 01/25/2017   Lab Results  Component Value Date   LDLCALC 100 (H) 01/25/2017   Lab Results  Component Value Date   TRIG 76 01/25/2017   Lab Results  Component Value Date   CHOLHDL 3.4 01/25/2017   Lab Results  Component Value Date   PSA 1.06 03/23/2020   PSA 4.27 (H) 11/27/2019   PSA 0.11 05/15/2018   Lab Results  Component Value Date   HGBA1C 5.4 11/27/2019   IMPRESSION AND PLAN:  1) Uncontrolled HTN; control is improved but he's not quite to the goal of avg 130/80 or better. Cont irbesartan 38m qd and lopressor 50 mg bid. His wife will get me the results of his labs drawn today and I'll make any further med recommendations based on his sCr and lytes.  2) CRI IIIa: as per #1 above, sCR and lytes drawn today by transplant clinic and we'll get results from wife.  3) Hx of liver transplant: doing great.  His tacrolimus level was a bit too high recently so they decreased his dose some and are following up his tacrolimus level today. Of note, his DM has essentially resolved since his liver transplant but we'll follow a1c again in about 6 mo.  An After Visit Summary was printed and given to the patient.  FOLLOW UP: Return in about 3 months (around 10/16/2020) for f/u HTN. Cpe 6 mo  Signed:  PCrissie Sickles MD            07/16/2020

## 2020-07-17 DIAGNOSIS — C4441 Basal cell carcinoma of skin of scalp and neck: Secondary | ICD-10-CM | POA: Diagnosis not present

## 2020-07-20 ENCOUNTER — Other Ambulatory Visit: Payer: Self-pay

## 2020-07-20 ENCOUNTER — Other Ambulatory Visit (HOSPITAL_BASED_OUTPATIENT_CLINIC_OR_DEPARTMENT_OTHER): Payer: Self-pay

## 2020-07-20 ENCOUNTER — Ambulatory Visit: Payer: 59 | Attending: Internal Medicine

## 2020-07-20 ENCOUNTER — Encounter: Payer: Self-pay | Admitting: Family Medicine

## 2020-07-20 ENCOUNTER — Telehealth: Payer: Self-pay | Admitting: Family Medicine

## 2020-07-20 DIAGNOSIS — Z23 Encounter for immunization: Secondary | ICD-10-CM

## 2020-07-20 MED ORDER — COVID-19 MRNA VACC (MODERNA) 100 MCG/0.5ML IM SUSP
INTRAMUSCULAR | 0 refills | Status: DC
  Filled 2020-07-20: qty 0.25, 1d supply, fill #0

## 2020-07-20 NOTE — Telephone Encounter (Signed)
I reviewed William [name]'s labs from transplant clinic on 07/16/20. His potassium was a tiny bit high.  Kidney function stable. Continue irbesartan 44m daily and metoprolol 50 mg twice daily.  We have to add a third bp med->pls eRx amlodipine 550m 1 tab po qd, #30. Keep monitoring bp and HR daily and send MyChart message with these numbers in about 7-10 days. Nonfasting lab appt in 7-10 d for bmet, dx hyperkalemia (to make sure potassium level is stable).-thx

## 2020-07-20 NOTE — Progress Notes (Signed)
   Covid-19 Vaccination Clinic  Name:  William [name]    MRN: [id number] DOB: 1950/03/15  07/20/2020  [name] was observed post Covid-19 immunization for 15 minutes without incident. He was provided with Vaccine Information Sheet and instruction to access the V-Safe system.   [name] was instructed to call 911 with any severe reactions post vaccine: Difficulty breathing  Swelling of face and throat  A fast heartbeat  A bad rash all over body  Dizziness and weakness   Immunizations Administered     Name Date Dose VIS Date Route   Moderna Covid-19 Booster Vaccine 07/20/2020  9:47 AM 0.25 mL 11/27/2019 Intramuscular   Manufacturer: Moderna   Lot: [id number]   [place]: [id number]

## 2020-07-21 ENCOUNTER — Other Ambulatory Visit: Payer: Self-pay

## 2020-07-21 ENCOUNTER — Other Ambulatory Visit (HOSPITAL_COMMUNITY): Payer: Self-pay

## 2020-07-21 DIAGNOSIS — E875 Hyperkalemia: Secondary | ICD-10-CM

## 2020-07-21 MED ORDER — AMLODIPINE BESYLATE 5 MG PO TABS
5.0000 mg | ORAL_TABLET | Freq: Every day | ORAL | 0 refills | Status: DC
  Filled 2020-07-21: qty 30, 30d supply, fill #0

## 2020-07-21 NOTE — Telephone Encounter (Signed)
Labs reviewed by PCP and recommendations given. See 6/13 or 6/14 phone note

## 2020-07-21 NOTE — Telephone Encounter (Signed)
Rx sent for amlodipine as well

## 2020-07-21 NOTE — Telephone Encounter (Signed)
Spoke with patient's wife, Wanda(DPR) regarding results/recommendations. Lab visit scheduled for 6/21, she will call back to cancel if [facility] decides to do labs prior to this appt and bmet included. She will also bring bp readings to lab appt

## 2020-07-23 MED FILL — Metoprolol Tartrate Tab 50 MG: ORAL | 90 days supply | Qty: 180 | Fill #0 | Status: AC

## 2020-07-24 ENCOUNTER — Other Ambulatory Visit (HOSPITAL_COMMUNITY): Payer: Self-pay

## 2020-07-28 ENCOUNTER — Other Ambulatory Visit: Payer: Self-pay

## 2020-07-28 ENCOUNTER — Ambulatory Visit (INDEPENDENT_AMBULATORY_CARE_PROVIDER_SITE_OTHER): Payer: 59

## 2020-07-28 DIAGNOSIS — Z760 Encounter for issue of repeat prescription: Secondary | ICD-10-CM | POA: Diagnosis not present

## 2020-07-28 DIAGNOSIS — E875 Hyperkalemia: Secondary | ICD-10-CM | POA: Diagnosis not present

## 2020-07-28 LAB — BASIC METABOLIC PANEL
BUN: 15 mg/dL (ref 6–23)
CO2: 26 mEq/L (ref 19–32)
Calcium: 9.5 mg/dL (ref 8.4–10.5)
Chloride: 102 mEq/L (ref 96–112)
Creatinine, Ser: 1.32 mg/dL (ref 0.40–1.50)
GFR: 54.51 mL/min — ABNORMAL LOW (ref >60.00)
Glucose, Bld: 101 mg/dL — ABNORMAL HIGH (ref 70–99)
Potassium: 5 mEq/L (ref 3.5–5.1)
Sodium: 137 mEq/L (ref 135–145)

## 2020-07-29 ENCOUNTER — Other Ambulatory Visit (HOSPITAL_COMMUNITY): Payer: Self-pay

## 2020-07-29 ENCOUNTER — Other Ambulatory Visit: Payer: Self-pay

## 2020-07-29 MED ORDER — AMLODIPINE BESYLATE 5 MG PO TABS
5.0000 mg | ORAL_TABLET | Freq: Every day | ORAL | 5 refills | Status: DC
  Filled 2020-07-29 – 2020-08-14 (×2): qty 30, 30d supply, fill #0
  Filled 2020-09-18: qty 30, 30d supply, fill #1
  Filled 2020-10-16: qty 30, 30d supply, fill #2
  Filled 2020-10-21 – 2020-11-17 (×2): qty 30, 30d supply, fill #3
  Filled 2020-12-12: qty 30, 30d supply, fill #4
  Filled 2021-01-10: qty 30, 30d supply, fill #5

## 2020-07-31 ENCOUNTER — Other Ambulatory Visit (HOSPITAL_COMMUNITY): Payer: Self-pay

## 2020-07-31 MED FILL — Everolimus Tab 1 MG: ORAL | 30 days supply | Qty: 90 | Fill #2 | Status: AC

## 2020-08-03 ENCOUNTER — Other Ambulatory Visit: Payer: Self-pay | Admitting: Family Medicine

## 2020-08-03 MED FILL — Pantoprazole Sodium EC Tab 40 MG (Base Equiv): ORAL | 30 days supply | Qty: 30 | Fill #0 | Status: AC

## 2020-08-04 ENCOUNTER — Other Ambulatory Visit (HOSPITAL_COMMUNITY): Payer: Self-pay

## 2020-08-04 MED ORDER — IRBESARTAN 75 MG PO TABS
75.0000 mg | ORAL_TABLET | Freq: Every day | ORAL | 0 refills | Status: DC
  Filled 2020-08-04: qty 90, 90d supply, fill #0

## 2020-08-06 ENCOUNTER — Ambulatory Visit: Payer: 59 | Admitting: Cardiovascular Disease

## 2020-08-06 DIAGNOSIS — Z5181 Encounter for therapeutic drug level monitoring: Secondary | ICD-10-CM | POA: Diagnosis not present

## 2020-08-06 DIAGNOSIS — B334 Hantavirus (cardio)-pulmonary syndrome [HPS] [HCPS]: Secondary | ICD-10-CM | POA: Diagnosis not present

## 2020-08-06 DIAGNOSIS — Z79899 Other long term (current) drug therapy: Secondary | ICD-10-CM | POA: Diagnosis not present

## 2020-08-06 DIAGNOSIS — Z20828 Contact with and (suspected) exposure to other viral communicable diseases: Secondary | ICD-10-CM | POA: Diagnosis not present

## 2020-08-06 DIAGNOSIS — D849 Immunodeficiency, unspecified: Secondary | ICD-10-CM | POA: Diagnosis not present

## 2020-08-06 DIAGNOSIS — D72828 Other elevated white blood cell count: Secondary | ICD-10-CM | POA: Diagnosis not present

## 2020-08-06 DIAGNOSIS — C4490 Unspecified malignant neoplasm of skin, unspecified: Secondary | ICD-10-CM | POA: Diagnosis not present

## 2020-08-06 DIAGNOSIS — Z944 Liver transplant status: Secondary | ICD-10-CM | POA: Diagnosis not present

## 2020-08-06 DIAGNOSIS — Z4823 Encounter for aftercare following liver transplant: Secondary | ICD-10-CM | POA: Diagnosis not present

## 2020-08-06 LAB — HEPATIC FUNCTION PANEL
ALT: 18 (ref 10–40)
AST: 19 (ref 14–40)
Alkaline Phosphatase: 106 (ref 25–125)

## 2020-08-06 LAB — BASIC METABOLIC PANEL
BUN: 13 (ref 4–21)
CO2: 24 — AB (ref 13–22)
Chloride: 100 (ref 99–108)
Creatinine: 1.5 — AB (ref 0.6–1.3)
Glucose: 144
Potassium: 5 (ref 3.4–5.3)
Sodium: 134 — AB (ref 137–147)

## 2020-08-06 LAB — CBC: RBC: 5.28 — AB (ref 3.87–5.11)

## 2020-08-06 LAB — CBC AND DIFFERENTIAL
HCT: 41 (ref 41–53)
Hemoglobin: 13.3 — AB (ref 13.5–17.5)
Neutrophils Absolute: 13.65
Platelets: 300 (ref 150–399)
WBC: 17.5

## 2020-08-06 LAB — COMPREHENSIVE METABOLIC PANEL: Calcium: 9 (ref 8.7–10.7)

## 2020-08-11 ENCOUNTER — Other Ambulatory Visit (HOSPITAL_COMMUNITY): Payer: Self-pay

## 2020-08-11 MED ORDER — ZORTRESS 1 MG PO TABS: ORAL_TABLET | ORAL | 11 refills | Status: DC

## 2020-08-11 MED ORDER — ZORTRESS 1 MG PO TABS
ORAL_TABLET | ORAL | 11 refills | Status: DC
  Filled 2020-09-30: qty 60, fill #0

## 2020-08-14 ENCOUNTER — Other Ambulatory Visit (HOSPITAL_COMMUNITY): Payer: Self-pay

## 2020-08-21 DIAGNOSIS — D849 Immunodeficiency, unspecified: Secondary | ICD-10-CM | POA: Diagnosis not present

## 2020-08-21 DIAGNOSIS — Z944 Liver transplant status: Secondary | ICD-10-CM | POA: Diagnosis not present

## 2020-08-27 ENCOUNTER — Other Ambulatory Visit (HOSPITAL_COMMUNITY): Payer: Self-pay

## 2020-08-31 ENCOUNTER — Other Ambulatory Visit (HOSPITAL_COMMUNITY): Payer: Self-pay

## 2020-08-31 ENCOUNTER — Encounter: Payer: Self-pay | Admitting: Family Medicine

## 2020-09-04 ENCOUNTER — Other Ambulatory Visit: Payer: Self-pay

## 2020-09-04 DIAGNOSIS — D849 Immunodeficiency, unspecified: Secondary | ICD-10-CM | POA: Diagnosis not present

## 2020-09-04 DIAGNOSIS — Z944 Liver transplant status: Secondary | ICD-10-CM | POA: Diagnosis not present

## 2020-09-07 ENCOUNTER — Other Ambulatory Visit (HOSPITAL_COMMUNITY): Payer: Self-pay

## 2020-09-07 MED ORDER — PANTOPRAZOLE SODIUM 40 MG PO TBEC
DELAYED_RELEASE_TABLET | ORAL | 5 refills | Status: DC
  Filled 2020-09-07: qty 30, 30d supply, fill #0

## 2020-09-09 ENCOUNTER — Other Ambulatory Visit (HOSPITAL_COMMUNITY): Payer: Self-pay

## 2020-09-09 MED ORDER — PANTOPRAZOLE SODIUM 40 MG PO TBEC
DELAYED_RELEASE_TABLET | ORAL | 11 refills | Status: DC
  Filled 2020-09-09: qty 30, 30d supply, fill #0

## 2020-09-18 ENCOUNTER — Other Ambulatory Visit (HOSPITAL_COMMUNITY): Payer: Self-pay

## 2020-09-22 ENCOUNTER — Other Ambulatory Visit (HOSPITAL_COMMUNITY): Payer: Self-pay

## 2020-09-22 MED ORDER — TACROLIMUS 1 MG PO CAPS
ORAL_CAPSULE | ORAL | 3 refills | Status: DC
  Filled 2020-09-22: qty 90, 30d supply, fill #0
  Filled 2020-10-24: qty 90, 30d supply, fill #1
  Filled 2020-12-12: qty 90, 30d supply, fill #2
  Filled 2021-01-10: qty 90, 30d supply, fill #3
  Filled 2021-02-04: qty 90, 30d supply, fill #4
  Filled 2021-03-05: qty 90, 30d supply, fill #5

## 2020-09-22 NOTE — Progress Notes (Signed)
Cardiology Clinic Note   Patient Name: William [name] Date of Encounter: 09/23/2020  Primary Care Provider:  Tammi Sou, MD Primary Cardiologist:  Shelva Majestic, MD  Patient Profile    William [name] 70 year old male presents to clinic today for follow-up evaluation of his coronary artery disease and chronic diastolic CHF.  Past Medical History    Past Medical History:  Diagnosis Date   AAA (abdominal aortic aneurysm) ([place]) 03/2014   repaired 2020   Ascending aortic aneurysm (HCC)    unrepaired. Stable at 4.3 cm on imaging by [facility] thoracic surg 10/2019. F/u w/vasc surg 2022.   Bilateral renal cysts    simple (03/2014 MRI)   CAD (coronary artery disease)    Cholelithiases 2018   asymptomatic   Chronic diastolic heart failure (HCC)    Chronic renal insufficiency, stage 3 (moderate) ([place]) 2022   baseline sCr 1.3-1.5 (GFR 55)   COVID-19 virus infection 02/2020   sotrovimab infusion   Diabetes mellitus with complication ([place]) 62/8315   A1c 6.8%. A1c 5.4% 11/2019   Gout    initially dx'd by diag arthrocentesis of elbow effusion in winter 2021, 3 episodes, all came after being started on chlorthalidone->I d/c'd chlorthal.   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  Improving + off oxygen as of 3 mo s/p liver transplant.  Doing well/resolved as of 02/2020 [facility] pulm f/u->they'll continue to follow.   History of blood transfusion 2018 X 4 dates   Chronic GI blood loss of unknown location despite full GI endoscopic eval, etc   History of liver transplant ([place]) 07/2019   [facility]   Hyperlipemia, mixed    elevated LFTs when on statins.     Hypertension    Cr bump 04/01/16 so I changed benicar-hct to benicar plain and added amlodipine 5 mg.   Increased prostate specific antigen (PSA) velocity 2021   0.11 to 4.77 from 2020 to 2021 (he got a liver transplant and was put on antirejection meds between these psa checks).   Iron deficiency anemia 05/2016   Acute blood loss anemia:  hospitalized, required transfusion x 3 U: colonoscopy and capsule study unrevealing.  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  W/u for ? hemolytic anemia to be pursued by hematologist as outpt.  Dr. [name], GI at [facility] following, too---he rec'd onc do bone marrow bx as of Jan 2019   Iron deficiency anemia due to chronic blood loss 2018/19   GI: transfusions x >20 required; multiple endoscopies and bleeding scans unrevealing. Lysteda and octreotide + monthly iron infusions as of 04/2017. Iron infusions changed to every other month as of 09/2018 hem f/u.   Liver cirrhosis secondary to NASH ([place]) 05/2016   Liver transplant 07/2019   Liver failure ([place]) 2020   NASH cirrhosis   Lung field abnormal finding on examination    Bibasilar L>R mild insp crackles-->x-ray showed mild interstitial changes/fibrosis/scarring.  Changes noted on all CXRs in 2018/2019.  Liver Transplant eval 03/2018->mild restriction on PFTs but no obstruction.  See further details in PMH "pulm fibrosis" section   Microscopic hematuria    Eval unremarkable by Dr. Eulogio Ditch.   Nonmelanoma skin cancer 08/15/2018   2020 BCC nose, excised.  2022 R side of neck->mohs   Obesity    Pulmonary fibrosis (HCC)    PFTs: restrictive lung dz ([facility] Liver transplant eval 05/2018). [facility] pulm eval felt this was likely  hepatopulmonary syndrome->causing his hypoxia->low likelihood of progression (as of 10/04/18 transplant clinic f/u). Pulm rehab helping as of 2020/2021. OFF oxygen as of 10/2019 transp f/u.   Spleen enlarged    Past Surgical History:  Procedure Laterality Date   ABDOMINAL AORTIC ANEURYSM REPAIR  08/23/2018   [facility]   CARDIAC CATHETERIZATION     CARDIOVASCULAR STRESS TEST  01/17/2012; 05/04/16   Normal stress nuclear study x 2 (2018--Normal perfusion. LVEF 66% with normal wall motion. This is a low risk study).   CERVICAL DISCECTOMY  1992   COLONOSCOPY  N/A 05/27/2016   No site/explanation for blood loss found.  Erythematous mucosa in cecum and ascending colon--Cecal bx: normal.  Procedure: COLONOSCOPY;  Surgeon: Carol Ada, MD;  Location: [facility] ENDOSCOPY;  Service: Endoscopy;  Laterality: N/A;   COLONOSCOPY W/ POLYPECTOMY  09/10/2014   Polypectomy x 2: recall 5 yrs (Dr. [name]).   CORONARY ANGIOPLASTY     CORONARY ARTERY BYPASS GRAFT  03/2006   CT ABDOMEN WITH CONTRAST ([place] HX)  11/2018   CT CHEST WWO CONTAST ([place] HX)  07/11/2019   No significant change in interstitial lung disease with possible underlying hepatopulmonary syndrome, Increased ascites in the setting of cirrhosis, tiny new right basilar nodule   DEXA  03/07/2019   T score 0.2/NORMAL   ENTEROSCOPY N/A 07/31/2016   Procedure: ENTEROSCOPY;  Surgeon: Ladene Artist, MD;  Location[facility] ENDOSCOPY;  Service: Endoscopy;  Laterality: N/A;   ENTEROSCOPY N/A 12/02/2016   Procedure: ENTEROSCOPY;  Surgeon: Carol Ada, MD;  Location: [facility] ENDOSCOPY;  Service: Endoscopy;  Laterality: N/A;   ESOPHAGOGASTRODUODENOSCOPY N/A 05/25/2016   No site/explanation for blood loss found.  Procedure: ESOPHAGOGASTRODUODENOSCOPY (EGD);  Surgeon: Juanita Craver, MD;  Location: [facility] ENDOSCOPY;  Service: Endoscopy;  Laterality: N/A;   ESOPHAGOGASTRODUODENOSCOPY N/A 09/23/2016   Procedure: ESOPHAGOGASTRODUODENOSCOPY (EGD);  Surgeon: Carol Ada, MD;  Location: [place];  Service: Endoscopy;  Laterality: N/A;   ESOPHAGOGASTRODUODENOSCOPY (EGD) WITH PROPOFOL N/A 08/21/2018   Procedure: ESOPHAGOGASTRODUODENOSCOPY (EGD) WITH PROPOFOL;  Surgeon: Carol Ada, MD;  Location: [facility] ENDOSCOPY;  Service: Endoscopy;  Laterality: N/A;   GIVENS CAPSULE STUDY N/A 05/27/2016   No source identified.  Repeat 10/2016--results pending.  Procedure: GIVENS CAPSULE STUDY;  Surgeon: Carol Ada, MD;  Location: [facility] ENDOSCOPY;  Service: Endoscopy;  Laterality: N/A;   GIVENS CAPSULE STUDY N/A 10/15/2016   Procedure: GIVENS CAPSULE STUDY;   Surgeon: Carol Ada, MD;  Location: [facility] ENDOSCOPY;  Service: Endoscopy;  Laterality: N/A;   GIVENS CAPSULE STUDY N/A 02/13/2017   Procedure: GIVENS CAPSULE STUDY;  Surgeon: Carol Ada, MD;  Location: [place];  Service: Endoscopy;  Laterality: N/A;   LIVER TRANSPLANT  08/02/2019   For NASH cirrhosis. [place]   NM GI BLOOD LOSS  01/27/2017   NEGATIVE   SMALL BOWEL ENTEROSCOPY  10/2016   ([facility], Dr. [name]: Double balloon enteroscopy--to the level of proximal ileum) jejunal polyps- which were removed--not bleeding & benign.  No source of bleeding was identified.   TRANSTHORACIC ECHOCARDIOGRAM  05/25/2016; 10/2017   05/2016: EF 60%, normal wall motion, grd II DD, mild aortic stenosis, dilated aortic root and ascending aorta. 10/2017: Mild LVH; no mention made of LV function.  Moderate aorticstenosis with mean gradient 19 peak gradient 41. AVA 1.1 cm^2.  05/2018 EF 55%,4.3 cm ascend aneur, mild AS.   VASECTOMY  1977    Allergies  Allergies  Allergen Reactions   Niacinamide Itching    Improved upon discontinuation  Thiazide-Type Diuretics Other (See Comments)    Chlorthalidone caused gout   Other     Other reaction(s): Unknown   Statins Other (See Comments)    Liver enzymes go up whenever on them    History of Present Illness    William [name] has a PMH of coronary artery disease status post CABG (LIMA-LAD, SVG-RCA, SVG-circumflex 2/08), obesity, metabolic syndrome, mixed hyperlipidemia, and AAA status post endovascular aneurysm repair 8/20.  His PMH also includes moderate aortic stenosis, CKD stage III, and liver transplant.  He developed pneumonia 2/20 and his bilirubin further increased to 9-10.  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  August 2020 he underwent EVAR for abdominal aortic aneurysm at [facility].  He was followed by [facility] and underwent liver transplant 6/21.  He did well following his transplant and was continued on prednisone and tacrolimus for  immunosuppression.  Followed up with Dr. Claiborne Billings 12/30/2019 and denied shortness of breath and chest discomfort.  He stated that his blood pressure at home was typically running in the 016-553 range systolic.  He reported that his creatinine had increased to 4 after his liver transplant and decreased to around 1.31.  His blood pressure was 152/90 with a pulse of 75.  His EKG showed normal sinus rhythm with incomplete right bundle branch block.  His chlorthalidone was restarted.  His prior echocardiogram showed moderate aortic valve stenosis.  He denied presyncope and syncope or exertional dyspnea.  Follow-up was planned for 6 months.  He presents to the clinic today for follow-up evaluation states he feels well.  He has noticed some increased lower extremity swelling over the last several months.  His weight has increased about 8 pounds since May.  He reports that he does try to limit his salt intake and drinks around 4 bottles of water per day.  He notices that his leg swelling has decreased when he wakes up in the morning and accumulates through the day.  He remains somewhat physically active around his house but reports that he is not as physically active as he was before he had gout and COVID.  He has seen orthopedics who told him he also has arthritis.  He denies recurrence of chest discomfort and shortness of breath.  He denies orthopnea PND.  I will prescribe furosemide 20 mg x 3 days and stop.  Order a BMP in 1 week.  Order an echocardiogram, give a salty 6 diet sheet, have him restrict his fluids less than 64 ounces and follow-up in 1 to 2 months.  Today he denies chest pain, shortness of breath, lower extremity edema, fatigue, palpitations, melena, hematuria, hemoptysis, diaphoresis, weakness, presyncope, syncope, orthopnea, and PND.   Home Medications    Prior to Admission medications   Medication Sig Start Date End Date Taking? Authorizing Provider  amLODipine (NORVASC) 5 MG tablet Take 1  tablet (5 mg total) by mouth daily. 07/29/20   Tammi Sou, MD  aspirin EC 81 MG tablet Take 81 mg by mouth daily. Swallow whole.    [provider]  calcium citrate-vitamin D (CITRACAL+D) 315-200 MG-UNIT tablet Take by mouth. 08/13/19   [provider]  COVID-19 mRNA vaccine, Moderna, 100 MCG/0.5ML injection Inject into the muscle. 07/20/20   Carlyle Basques, MD  irbesartan (AVAPRO) 75 MG tablet Take 1 tablet (75 mg total) by mouth daily. 08/04/20   McGowen, Adrian Blackwater, MD  metoprolol tartrate (LOPRESSOR) 50 MG tablet TAKE 1 TABLET BY MOUTH 2 TIMES DAILY 04/24/20 04/24/21  Tammi Sou, MD  pantoprazole (PROTONIX) 40 MG tablet TAKE 1 TABLET BY MOUTH ONCE A DAY 08/13/19 09/03/20  [name], [name], MD  pantoprazole (PROTONIX) 40 MG tablet Take 1 tablet (40 mg total) by mouth once daily 09/07/20     pantoprazole (PROTONIX) 40 MG tablet Take 1 tablet (40 mg total) by mouth once daily 09/09/20     predniSONE (DELTASONE) 5 MG tablet 1/2 tab po qd 11/25/19   McGowen, Adrian Blackwater, MD  triamcinolone (KENALOG) 0.1 % APPLY TO THE RASH 2 TIMES DAILY AS DIRECTED. FOLLOW WITH CERAVE [employer]AM. STOP WHEN SMOOTH 04/23/20 04/23/21    ZORTRESS 1 MG TABS TAKE 2 TABLETS (2 MG) BY MOUTH EVERY MORNING, THEN 1 TABLET (1MG) NIGHTLY. 05/08/20 05/08/21  Teena Irani, MD  ZORTRESS 1 MG TABS Take 1 mg by mouth 2 (two) times daily 08/10/20     ZORTRESS 1 MG TABS Take 1 mg by mouth 2 (two) times daily 08/10/20       Family History    Family History  Problem Relation Age of Onset   Hypertension Mother    Cancer - Other Mother        liver cancer   Heart disease Father    Heart attack Father    Cancer - Lung Father    Diabetes Father    Liver disease Sister    Anemia Sister    Heart disease Sister    Heart attack Sister    Heart disease Brother        CABG 30 YEARS AGO   Hypertension Brother    Mesothelioma Brother        HALF-BROTHER   Cancer - Other Brother        CLL   Cancer - Lung Brother        Mets to  brain   Cancer - Lung Sister        HALF-SISTER   Liver disease Brother    Heart disease Brother        CABG-2012   Emphysema Maternal Grandfather    Cancer - Other Paternal Grandmother        Stomach   Heart attack Paternal Grandfather    He indicated that his mother is deceased. He indicated that his father is deceased. He indicated that two of his three sisters are alive. He indicated that all of his four brothers are deceased. He indicated that his maternal grandmother is deceased. He indicated that his maternal grandfather is deceased. He indicated that his paternal grandmother is deceased. He indicated that his paternal grandfather is deceased.  Social History    Social History   Socioeconomic History   Marital status: Married    Spouse name: Not on file   Number of children: Not on file   Years of education: Not on file   Highest education level: Not on file  Occupational History   Not on file  Tobacco Use   Smoking status: Former    Types: Cigarettes   Smokeless tobacco: Never   Tobacco comments:    QUIT I 1983  Vaping Use   Vaping Use: Never used  Substance and Sexual Activity   Alcohol use: No   Drug use: No   Sexual activity: Yes  Other Topics Concern   Not on file  Social History Narrative   Married, 3 grown children, 3 GCs.   Educ: 10th grade.   Occupation: Retired Brewing technologist.   Tob: quit 1983, smoked about 20 pack-yr hx  prior.   No alcohol.   Social Determinants of Health   Financial Resource Strain: Not on file  Food Insecurity: Not on file  Transportation Needs: Not on file  Physical Activity: Not on file  Stress: Not on file  Social Connections: Not on file  Intimate Partner Violence: Not on file     Review of Systems    General:  No chills, fever, night sweats or weight changes.  Cardiovascular:  No chest pain, dyspnea on exertion, edema, orthopnea, palpitations, paroxysmal nocturnal dyspnea. Dermatological: No rash,  lesions/masses Respiratory: No cough, dyspnea Urologic: No hematuria, dysuria Abdominal:   No nausea, vomiting, diarrhea, bright red blood per rectum, melena, or hematemesis Neurologic:  No visual changes, wkns, changes in mental status. All other systems reviewed and are otherwise negative except as noted above.  Physical Exam    VS:  BP 136/74   Pulse 78   Ht 5' 8"  (1.727 m)   Wt 231 lb 3.2 oz (104.9 kg)   BMI 35.15 kg/m  , BMI Body mass index is 35.15 kg/m. GEN: Well nourished, well developed, in no acute distress. HEENT: normal. Neck: Supple, no JVD, carotid bruits, or masses. Cardiac: RRR, 3/6 systolic murmur heard along right sternal border, rubs, or gallops. No clubbing, cyanosis, edema.  Radials/DP/PT 2+ and equal bilaterally.  Respiratory:  Respirations regular and unlabored, clear to auscultation bilaterally. GI: Soft, nontender, nondistended, BS + x 4. MS: no deformity or atrophy. Skin: warm and dry, no rash. Neuro:  Strength and sensation are intact. Psych: Normal affect.  Accessory Clinical Findings    Recent Labs: 08/06/2020: ALT 18; BUN 13; Creatinine 1.5; Hemoglobin 13.3; Platelets 300; Potassium 5.0; Sodium 134   Recent Lipid Panel    Component Value Date/Time   CHOL 162 01/25/2017 0000   CHOL 135 11/29/2013 0940   TRIG 76 01/25/2017 0000   TRIG 177 (H) 11/29/2013 0940   HDL 47 01/25/2017 0000   HDL 34 (L) 11/29/2013 0940   CHOLHDL 3.4 01/25/2017 0000   CHOLHDL 4 09/29/2016 1118   VLDL 13.8 09/29/2016 1118   LDLCALC 100 (H) 01/25/2017 0000   LDLCALC 66 11/29/2013 0940    ECG personally reviewed by me today-sinus rhythm first-degree AV block incomplete right bundle branch block anterior septal infarct undetermined age 1 bpm- No acute changes  Nuclear stress test 05/04/2016 The left ventricular ejection fraction is hyperdynamic (>65%). Nuclear stress EF: 66%. No T wave inversion was noted during stress. There was no ST segment deviation noted during  stress. This is a low risk study.   Normal perfusion. LVEF 66% with normal wall motion. This is a low risk study.   Echocardiogram 10/11/2017 Study Conclusions   - Left ventricle: Wall thickness was increased in a pattern of mild    LVH.  - Aortic valve: There was moderate stenosis. Valve area (VTI): 1.42    cm^2. Valve area (Vmax): 1.24 cm^2. Valve area (Vmean): 1.11    cm^2.  - Left atrium: The atrium was mildly dilated.  - Impressions: Mean gradient slightly lower than 2018.   Impressions:   - Mean gradient slightly lower than 2018.   -------------------------------------------------------------------  Labs, prior tests, procedures, and surgery:  Coronary artery bypass grafting.   -------------------------------------------------------------------  Study data:  Comparison was made to the study of 05/19/2016.  Study  status:  Routine.  Procedure:  The patient reported no pain pre or  post test. Transthoracic echocardiography. Image quality was  adequate.  Study completion:  There were  no complications.  Echocardiography.  M-mode, complete 2D, spectral Doppler, and color  Doppler.  Birthdate:  Patient birthdate: November 27, 1950.  Age:  Patient  is 70 yr old.  Sex:  Gender: male.    BMI: 35.2 kg/m^2.  Blood  pressure:     129/76  Patient status:  Outpatient.  Study date:  Study date: 10/11/2017. Study time: 11:33 AM.  Location:  [facility]   -------------------------------------------------------------------   -------------------------------------------------------------------  Left ventricle:   Wall thickness was increased in a pattern of mild  LVH.   -------------------------------------------------------------------  Aortic valve:   Trileaflet; moderately thickened, moderately  calcified leaflets.  Doppler:   There was moderate stenosis.  VTI ratio of LVOT to aortic valve: 0.41. Valve area (VTI): 1.42  cm^2. Indexed valve area (VTI): 0.61 cm^2/m^2. Peak velocity  ratio  of LVOT to aortic valve: 0.36. Valve area (Vmax): 1.24 cm^2.  Indexed valve area (Vmax): 0.54 cm^2/m^2. Mean velocity ratio of  LVOT to aortic valve: 0.32. Valve area (Vmean): 1.11 cm^2. Indexed  valve area (Vmean): 0.48 cm^2/m^2.    Mean gradient (S): 19 mm Hg.  Peak gradient (S): 41 mm Hg.   -------------------------------------------------------------------  Aorta:  Ascending aorta: The ascending aorta was mildly dilated.   -------------------------------------------------------------------  Mitral valve:   Mildly thickened leaflets .  Doppler:  There was  trivial regurgitation.    Valve area by pressure half-time: 3.38  cm^2. Indexed valve area by pressure half-time: 1.46 cm^2/m^2.  Peak gradient (D): 5 mm Hg.   -------------------------------------------------------------------  Left atrium:  The atrium was mildly dilated.   -------------------------------------------------------------------  Atrial septum:  Poorly visualized.   -------------------------------------------------------------------  Right ventricle:  The cavity size was normal. Wall thickness was  normal. Systolic function was normal.   -------------------------------------------------------------------  Pulmonic valve:    Doppler:  There was mild regurgitation.   -------------------------------------------------------------------  Tricuspid valve:   Doppler:  There was mild regurgitation.   -------------------------------------------------------------------  Right atrium:  The atrium was normal in size.   -------------------------------------------------------------------  Pericardium:  The pericardium was normal in appearance.   -------------------------------------------------------------------  Systemic veins:  Inferior vena cava: The vessel was normal in size. The  respirophasic diameter changes were in the normal range (>= 50%),  consistent with normal central venous pressure.   Assessment &  Plan   1.  Coronary artery disease-denies recent episodes of arm neck back or chest discomfort.  Status post CABG (LIMA-LAD, SVG-RCA, SVG-circumflex 2/08).Underwent stress testing 3/18 which showed low risk and no ischemia. Continue aspirin, metoprolol, amlodipine Heart healthy low-sodium diet-salty 6 given Increase physical activity as tolerated  Essential hypertension-BP today 136/74.  Well-controlled at home. Continue  metoprolol, amlodipine, irbesartan Heart healthy low-sodium diet-salty 6 given Increase physical activity as tolerated  Chronic diastolic CHF-no increased DOE or activity tolerance.  Echocardiogram 10/11/2017 showed mild LVH, moderate aortic stenosis with a mean gradient of 19 mmHg and mild ascending aortic dilation. Continue irbesartan, metoprolol, irbesartan Heart healthy low-sodium diet-salty 6 given Increase physical activity as tolerated Repeat echocardiogram  Moderate aortic [id number]-5/0 systolic murmur.  Continues to be fairly physically active and denies recent increase in DOE or activity intolerance. Repeat echocardiogram  Ascending aortic dilation-no recent episodes of chest or back discomfort.  Blood pressure well controlled Repeat echocardiogram  Abdominal aortic aneurysm-status post [facility] at [facility] 8/20 Follows with [facility]  Liver cirrhosis-underwent liver transplant 08/02/2019 at [facility] with [facility]  Disposition: Follow-up with Dr. Claiborne Billings in 1-2 months.  Jossie Ng. Shaday Rayborn NP-C  09/23/2020, 3:36 PM [facility] [place] [address] Office [phone] Fax [phone]  Notice: This dictation was prepared with Dragon dictation along with smaller phrase technology. Any transcriptional errors that result from this process are unintentional and may not be corrected upon review.  I spent 14 minutes examining this patient, reviewing medications, and using patient centered shared decision making involving her cardiac care.   Prior to her visit I spent greater than 20 minutes reviewing her past medical history,  medications, and prior cardiac tests.

## 2020-09-23 ENCOUNTER — Ambulatory Visit (INDEPENDENT_AMBULATORY_CARE_PROVIDER_SITE_OTHER): Payer: 59 | Admitting: General Practice

## 2020-09-23 ENCOUNTER — Other Ambulatory Visit (HOSPITAL_COMMUNITY): Payer: Self-pay

## 2020-09-23 ENCOUNTER — Encounter: Payer: Self-pay | Admitting: General Practice

## 2020-09-23 ENCOUNTER — Other Ambulatory Visit: Payer: Self-pay

## 2020-09-23 VITALS — BP 136/74 | HR 78 | Ht 68.0 in | Wt 231.2 lb

## 2020-09-23 DIAGNOSIS — I5032 Chronic diastolic (congestive) heart failure: Secondary | ICD-10-CM | POA: Diagnosis not present

## 2020-09-23 DIAGNOSIS — I77810 Thoracic aortic ectasia: Secondary | ICD-10-CM | POA: Diagnosis not present

## 2020-09-23 DIAGNOSIS — I10 Essential (primary) hypertension: Secondary | ICD-10-CM | POA: Diagnosis not present

## 2020-09-23 DIAGNOSIS — Z944 Liver transplant status: Secondary | ICD-10-CM | POA: Diagnosis not present

## 2020-09-23 DIAGNOSIS — Z79899 Other long term (current) drug therapy: Secondary | ICD-10-CM

## 2020-09-23 DIAGNOSIS — I350 Nonrheumatic aortic (valve) stenosis: Secondary | ICD-10-CM | POA: Diagnosis not present

## 2020-09-23 DIAGNOSIS — I2510 Atherosclerotic heart disease of native coronary artery without angina pectoris: Secondary | ICD-10-CM

## 2020-09-23 MED ORDER — FUROSEMIDE 20 MG PO TABS
20.0000 mg | ORAL_TABLET | Freq: Every day | ORAL | 0 refills | Status: DC
  Filled 2020-09-23: qty 10, 10d supply, fill #0

## 2020-09-23 NOTE — Patient Instructions (Addendum)
Medication Instructions:  START LASIX 20MG FOR 3 DAYS THEN STOP  *If you need a refill on your cardiac medications before your next appointment, please call your pharmacy*  Lab Work:    NON-FASTING LAB IN 1 WEEK  Testing/Procedures:      Echocardiogram - Your physician has requested that you have an echocardiogram. Echocardiography is a painless test that uses sound waves to create images of your heart. It provides your doctor with information about the size and shape of your heart and how well your heart's chambers and valves are working. This procedure takes approximately one hour. There are no restrictions for this procedure. This will be performed at our [facility] location - [address].  PLEASE PURCHASE AND WEAR COMPRESSION STOCKINGS DAILY AND TAKE OFF AT BEDTIME. Compression stockings are elastic socks that squeeze the legs. They help to increase blood flow to the legs and to decrease swelling in the legs from fluid retention, and reduce the chance of developing blood clots in the lower legs. Please put on in the AM when dressing and off at night when dressing for bed.  LET THEM KNOW THAT YOU NEED KNEE HIGH'S WITH COMPRESSION OF 15-20 mmhg.  [employer]  [place] ([place] [phone]); [place], [place] [address]; [phone]  EMAIL   [email or web address].  PLEASE MAKE SURE TO ELEVATE YOUR FEET & LEGS WHILE SITTING, THIS WILL HELP WITH THE SWELLING ALSO.    Special Instructions FLUID RESTRICTION 64OZ OR LESS DAILY  PLEASE READ AND FOLLOW SALTY 6-ATTACHED-1,862m daily  PLEASE INCREASE PHYSICAL ACTIVITY AS TOLERATED,   Follow-Up: Your next appointment:  1-2 MONTHS In Person with JSweden Valley FNP-C  [facility] and your health needs are our priority.  As part of our continuing mission to provide you with exceptional heart care, we have created designated Provider Care Teams.  These Care Teams include your primary Cardiologist (physician)  and Advanced Practice Providers (APPs -  Physician Assistants and Nurse Practitioners) who all work together to provide you with the care you need, when you need it.            6 SALTY THINGS TO AVOID     1,800MG DAILY

## 2020-09-30 ENCOUNTER — Other Ambulatory Visit (HOSPITAL_COMMUNITY): Payer: Self-pay

## 2020-10-01 ENCOUNTER — Other Ambulatory Visit (HOSPITAL_COMMUNITY): Payer: Self-pay

## 2020-10-01 DIAGNOSIS — Z944 Liver transplant status: Secondary | ICD-10-CM | POA: Diagnosis not present

## 2020-10-01 DIAGNOSIS — D849 Immunodeficiency, unspecified: Secondary | ICD-10-CM | POA: Diagnosis not present

## 2020-10-01 MED FILL — Everolimus Tab 1 MG: ORAL | 30 days supply | Qty: 90 | Fill #3 | Status: CN

## 2020-10-02 ENCOUNTER — Other Ambulatory Visit (HOSPITAL_COMMUNITY): Payer: Self-pay

## 2020-10-02 MED FILL — Everolimus Tab 1 MG: ORAL | 30 days supply | Qty: 90 | Fill #3 | Status: AC

## 2020-10-05 ENCOUNTER — Other Ambulatory Visit (HOSPITAL_COMMUNITY): Payer: Self-pay

## 2020-10-06 ENCOUNTER — Other Ambulatory Visit (HOSPITAL_COMMUNITY): Payer: Self-pay

## 2020-10-06 MED ORDER — ZOSTER VAC RECOMB ADJUVANTED 50 MCG/0.5ML IM SUSR
INTRAMUSCULAR | 1 refills | Status: DC
Start: 2020-10-06 — End: 2021-01-12
  Filled 2020-10-06: qty 0.5, 1d supply, fill #0
  Filled 2020-12-07: qty 0.5, 1d supply, fill #1

## 2020-10-06 MED ORDER — PANTOPRAZOLE SODIUM 40 MG PO TBEC
DELAYED_RELEASE_TABLET | ORAL | 3 refills | Status: DC
  Filled 2020-10-06: qty 90, 90d supply, fill #0
  Filled 2021-01-04: qty 90, 90d supply, fill #1
  Filled 2021-04-10: qty 90, 90d supply, fill #2
  Filled 2021-07-11: qty 90, 90d supply, fill #3

## 2020-10-08 DIAGNOSIS — L738 Other specified follicular disorders: Secondary | ICD-10-CM | POA: Diagnosis not present

## 2020-10-08 DIAGNOSIS — L720 Epidermal cyst: Secondary | ICD-10-CM | POA: Diagnosis not present

## 2020-10-08 DIAGNOSIS — L578 Other skin changes due to chronic exposure to nonionizing radiation: Secondary | ICD-10-CM | POA: Diagnosis not present

## 2020-10-08 DIAGNOSIS — L570 Actinic keratosis: Secondary | ICD-10-CM | POA: Diagnosis not present

## 2020-10-13 ENCOUNTER — Other Ambulatory Visit (HOSPITAL_COMMUNITY): Payer: Self-pay

## 2020-10-14 ENCOUNTER — Ambulatory Visit (INDEPENDENT_AMBULATORY_CARE_PROVIDER_SITE_OTHER): Payer: 59 | Admitting: Family Medicine

## 2020-10-14 ENCOUNTER — Encounter: Payer: Self-pay | Admitting: Family Medicine

## 2020-10-14 ENCOUNTER — Other Ambulatory Visit (HOSPITAL_COMMUNITY): Payer: Self-pay

## 2020-10-14 ENCOUNTER — Other Ambulatory Visit: Payer: Self-pay

## 2020-10-14 VITALS — BP 127/80 | HR 69 | Temp 97.7°F | Wt 233.6 lb

## 2020-10-14 DIAGNOSIS — Z23 Encounter for immunization: Secondary | ICD-10-CM | POA: Diagnosis not present

## 2020-10-14 DIAGNOSIS — N183 Chronic kidney disease, stage 3 unspecified: Secondary | ICD-10-CM

## 2020-10-14 DIAGNOSIS — I2510 Atherosclerotic heart disease of native coronary artery without angina pectoris: Secondary | ICD-10-CM

## 2020-10-14 DIAGNOSIS — I10 Essential (primary) hypertension: Secondary | ICD-10-CM | POA: Diagnosis not present

## 2020-10-14 DIAGNOSIS — R600 Localized edema: Secondary | ICD-10-CM | POA: Diagnosis not present

## 2020-10-14 LAB — BASIC METABOLIC PANEL
BUN: 16 mg/dL (ref 6–23)
CO2: 26 mEq/L (ref 19–32)
Calcium: 9.7 mg/dL (ref 8.4–10.5)
Chloride: 100 mEq/L (ref 96–112)
Creatinine, Ser: 1.37 mg/dL (ref 0.40–1.50)
GFR: 52.05 mL/min — ABNORMAL LOW (ref >60.00)
Glucose, Bld: 110 mg/dL — ABNORMAL HIGH (ref 70–99)
Potassium: 4.7 mEq/L (ref 3.5–5.1)
Sodium: 137 mEq/L (ref 135–145)

## 2020-10-14 LAB — LIPID PANEL
Cholesterol: 205 mg/dL — ABNORMAL HIGH (ref 0–200)
HDL: 27.3 mg/dL — ABNORMAL LOW (ref >39.00)
NonHDL: 177.52
Total CHOL/HDL Ratio: 8
Triglycerides: 274 mg/dL — ABNORMAL HIGH (ref 0.0–149.0)
VLDL: 54.8 mg/dL — ABNORMAL HIGH (ref 0.0–40.0)

## 2020-10-14 LAB — LDL CHOLESTEROL, DIRECT: Direct LDL: 142 mg/dL

## 2020-10-14 LAB — MICROALBUMIN / CREATININE URINE RATIO
Creatinine,U: 27.9 mg/dL
Microalb Creat Ratio: 4.2 mg/g (ref 0.0–30.0)
Microalb, Ur: 1.2 mg/dL (ref 0.0–1.9)

## 2020-10-14 MED ORDER — IRBESARTAN 75 MG PO TABS
75.0000 mg | ORAL_TABLET | Freq: Every day | ORAL | 3 refills | Status: DC
  Filled 2020-10-14 – 2020-11-01 (×2): qty 90, 90d supply, fill #0
  Filled 2021-01-23: qty 90, 90d supply, fill #1
  Filled 2021-05-06: qty 90, 90d supply, fill #2

## 2020-10-14 NOTE — Progress Notes (Signed)
OFFICE VISIT  10/14/2020  CC:  Chief Complaint  Patient presents with   Follow-up    3 month RCI    HPI:    Patient is a 70 y.o. Caucasian male who presents for 3 mo f/u HTN, CRI IIIa, and hx of liver transplant for NASH cirrhosis. A/P as of last visit: "1) Uncontrolled HTN; control is improved but he's not quite to the goal of avg 130/80 or better. Cont irbesartan 53m qd and lopressor 50 mg bid. His wife will get me the results of his labs drawn today and I'll make any further med recommendations based on his sCr and lytes.   2) CRI IIIa: as per #1 above, sCR and lytes drawn today by transplant clinic and we'll get results from wife.   3) Hx of liver transplant: doing great.  His tacrolimus level was a bit too high recently so they decreased his dose some and are following up his tacrolimus level today. Of note, his DM has essentially resolved since his liver transplant but we'll follow a1c again in about 6 mo."  INTERIM HX: Feeling well.  At cardiology f/u about 2 wks ago he had gained some fluid wt so he was rx'd lasix 266mqd x 6d and echo was ordered (Dr. KeClaiborne Billings   LE fluid did get improve some and has maintained stability since--?not entirely clear.  Down in mornings, accumulates in daytime.  Says caloric intake same as usual, not excessive.  Low Na diet but eats restaurant 2-3x/week.   No SOB or DOE, acitivity is "piddling" around property.  Not doing treadmill. Needs rpt bmet today.  HTN: home 120-130/70-80.  HR 70. Echo is scheduled locally, [facility]  CRI III: avoids NSAIDs.  Hx liver transplant: taking everolimus and tacrolimus.  Hx GI bleed from undetermined site (?small bowel?)-->Hb has been stable, most recent 13.3 about 2 mo ago.  ROS as above, plus--> no fevers, no CP, no SOB, no wheezing, no cough, no dizziness, no HAs, no rashes, no melena/hematochezia.  No polyuria or polydipsia.  No myalgias or arthralgias.  No focal weakness,  paresthesias, or tremors.  No acute vision or hearing abnormalities.  No dysuria or unusual/new urinary urgency or frequency.  No recent changes in lower legs. No n/v/d or abd pain.  No palpitations.     Past Medical History:  Diagnosis Date   AAA (abdominal aortic aneurysm) ([facility]02/2016   repaired 2020   Ascending aortic aneurysm (HCC)    unrepaired. [facility] F/u w/vasc surg 2022.   Bilateral renal cysts    simple (03/2014 MRI)   CAD (coronary artery disease)    Cholelithiases 2018   asymptomatic   Chronic diastolic heart failure (HCC)    Chronic renal insufficiency, stage 3 (moderate) ([id number]   baseline sCr 1.3-1.5 (GFR 55)   COVID-19 virus infection 02/2020   sotrovimab infusion   Diabetes mellitus with complication ([id number] A1c 6.8%. A1c 5.4% 11/2019   Gout    initially dx'd by diag arthrocentesis of elbow effusion in winter 2021, 3 episodes, all came after being started on chlorthalidone->I d/c'd chlorthal.   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  Improving + off oxygen as of 3 mo s/p liver transplant.  Doing well/resolved as of 02/2020 [facility] pulm f/u->they'll continue to follow.   History of blood transfusion 2018 X 4 dates   Chronic GI blood loss  of unknown location despite full GI endoscopic eval, etc   History of liver transplant ([place]) 07/2019   [facility]   Hyperlipemia, mixed    elevated LFTs when on statins.     Hypertension    Cr bump 04/01/16 so I changed benicar-hct to benicar plain and added amlodipine 5 mg.   Increased prostate specific antigen (PSA) velocity 2021   0.11 to 4.77 from 2020 to 2021 (he got a liver transplant and was put on antirejection meds between these psa checks).   Iron deficiency anemia 05/2016   Acute blood loss anemia: hospitalized, required transfusion x 3 U: colonoscopy and capsule study unrevealing.  Readmitted 6/22-6/25, 2018 for symptomatic anemia again, got transfused x 4U, EGD  with grd I esoph varices and port hyt gastropathy.  W/u for ? hemolytic anemia to be pursued by hematologist as outpt.  Dr. [name], GI at [facility] following, too---he rec'd onc do bone marrow bx as of Jan 2019   Iron deficiency anemia due to chronic blood loss 2018/19   GI: transfusions x >20 required; multiple endoscopies and bleeding scans unrevealing. Lysteda and octreotide + monthly iron infusions as of 04/2017. Iron infusions changed to every other month as of 09/2018 hem f/u.   Liver cirrhosis secondary to NASH ([place]) 05/2016   Liver transplant 07/2019   Liver failure ([place]) 2020   NASH cirrhosis   Lung field abnormal finding on examination    Bibasilar L>R mild insp crackles-->x-ray showed mild interstitial changes/fibrosis/scarring.  Changes noted on all CXRs in 2018/2019.  Liver Transplant eval 03/2018->mild restriction on PFTs but no obstruction.  See further details in PM[facility] "pulm fibrosis" section   Microscopic hematuria    Eval unremarkable by Dr. Eulogio Ditch.   Nonmelanoma skin cancer 08/15/2018   2020 BCC nose, excised.  2022 R side of neck->mohs   Obesity    Pulmonary fibrosis (HCC)    PFTs: restrictive lung dz ([facility] transplant eval 05/2018). [facility] pulm eval felt this was likely hepatopulmonary syndrome->causing his hypoxia->low likelihood of progression (as of 10/04/18 transplant clinic f/u). Pulm rehab helping as of 2020/2021. OFF oxygen as of 10/2019 transp f/u.   Spleen enlarged     Past Surgical History:  Procedure Laterality Date   ABDOMINAL AORTIC ANEURYSM REPAIR  08/23/2018   [facility]   CARDIAC CATHETERIZATION     CARDIOVASCULAR STRESS TEST  01/17/2012; 05/04/16   Normal stress nuclear study x 2 (2018--Normal perfusion. LVEF 66% with normal wall motion. This is a low risk study).   CERVICAL DISCECTOMY  1992   COLONOSCOPY N/A 05/27/2016   No site/explanation for blood loss found.  Erythematous mucosa in cecum and ascending colon--Cecal bx: normal.  Procedure: COLONOSCOPY;  Surgeon:  Carol Ada, MD;  Location[facility] ENDOSCOPY;  Service: Endoscopy;  Laterality: N/A;   COLONOSCOPY W/ POLYPECTOMY  09/10/2014   Polypectomy x 2: recall 5 yrs (Dr. [name]).   CORONARY ANGIOPLASTY     CORONARY ARTERY BYPASS GRAFT  03/2006   CT ABDOMEN WITH CONTRAST ([place] HX)  11/2018   CT CHEST WWO CONTAST ([place] HX)  07/11/2019   No significant change in interstitial lung disease with possible underlying hepatopulmonary syndrome, Increased ascites in the setting of cirrhosis, tiny new right basilar nodule   DEXA  03/07/2019   T score 0.2/NORMAL   ENTEROSCOPY N/A 07/31/2016   Procedure: ENTEROSCOPY;  Surgeon: Ladene Artist, MD;  Location: [facility] ENDOSCOPY;  Service: Endoscopy;  Laterality: N/A;   ENTEROSCOPY N/A 12/02/2016   Procedure: ENTEROSCOPY;  Surgeon:  Carol Ada, MD;  Location: [facility] ENDOSCOPY;  Service: Endoscopy;  Laterality: N/A;   ESOPHAGOGASTRODUODENOSCOPY N/A 05/25/2016   No site/explanation for blood loss found.  Procedure: ESOPHAGOGASTRODUODENOSCOPY (EGD);  Surgeon: Juanita Craver, MD;  Location: [facility] ENDOSCOPY;  Service: Endoscopy;  Laterality: N/A;   ESOPHAGOGASTRODUODENOSCOPY N/A 09/23/2016   Procedure: ESOPHAGOGASTRODUODENOSCOPY (EGD);  Surgeon: Carol Ada, MD;  Location: [place];  Service: Endoscopy;  Laterality: N/A;   ESOPHAGOGASTRODUODENOSCOPY (EGD) WITH PROPOFOL N/A 08/21/2018   Procedure: ESOPHAGOGASTRODUODENOSCOPY (EGD) WITH PROPOFOL;  Surgeon: Carol Ada, MD;  Location: [facility] ENDOSCOPY;  Service: Endoscopy;  Laterality: N/A;   GIVENS CAPSULE STUDY N/A 05/27/2016   No source identified.  Repeat 10/2016--results pending.  Procedure: GIVENS CAPSULE STUDY;  Surgeon: Carol Ada, MD;  Location: [facility] ENDOSCOPY;  Service: Endoscopy;  Laterality: N/A;   GIVENS CAPSULE STUDY N/A 10/15/2016   Procedure: GIVENS CAPSULE STUDY;  Surgeon: Carol Ada, MD;  Location: [facility] ENDOSCOPY;  Service: Endoscopy;  Laterality: N/A;   GIVENS CAPSULE STUDY N/A 02/13/2017   Procedure: GIVENS CAPSULE STUDY;   Surgeon: Carol Ada, MD;  Location: [place];  Service: Endoscopy;  Laterality: N/A;   LIVER TRANSPLANT  08/02/2019   For NASH cirrhosis. [place]   NM GI BLOOD LOSS  01/27/2017   NEGATIVE   SMALL BOWEL ENTEROSCOPY  10/2016   ([facility], Dr. [name]: Double balloon enteroscopy--to the level of proximal ileum) jejunal polyps- which were removed--not bleeding & benign.  No source of bleeding was identified.   TRANSTHORACIC ECHOCARDIOGRAM  05/25/2016; 10/2017   05/2016: EF 60%, normal wall motion, grd II DD, mild aortic stenosis, dilated aortic root and ascending aorta. 10/2017: Mild LVH; no mention made of LV function.  Moderate aorticstenosis with mean gradient 19 peak gradient 41. AVA 1.1 cm^2.  05/2018 EF 55%,4.3 cm ascend aneur, mild AS.   VASECTOMY  1977    Outpatient Medications Prior to Visit  Medication Sig Dispense Refill   amLODipine (NORVASC) 5 MG tablet Take 1 tablet (5 mg total) by mouth daily. 30 tablet 5   aspirin EC 81 MG tablet Take 81 mg by mouth daily. Swallow whole.     COVID-19 mRNA vaccine, Moderna, 100 MCG/0.5ML injection Inject into the muscle. 0.25 mL 0   furosemide (LASIX) 20 MG tablet Take 1 tablet (20 mg total) by mouth daily before breakfast. FOR 3 DAYS THEN STOP 10 tablet 0   metoprolol tartrate (LOPRESSOR) 50 MG tablet TAKE 1 TABLET BY MOUTH 2 TIMES DAILY 180 tablet 3   pantoprazole (PROTONIX) 40 MG tablet Take 1 tablet (40 mg total) by mouth once daily 90 tablet 3   tacrolimus (PROGRAF) 1 MG capsule Take 2 capsules by mouth every morning AND 1 capsule every evening 270 capsule 3   triamcinolone (KENALOG) 0.1 % APPLY TO THE RASH 2 TIMES DAILY AS DIRECTED. FOLLOW WITH CERAVE CREAM. STOP WHEN SMOOTH 80 g 2   ZORTRESS 1 MG TABS TAKE 2 TABLETS (2 MG) BY MOUTH EVERY MORNING, THEN 1 TABLET (1MG) NIGHTLY. 90 tablet 11   Zoster Vaccine Adjuvanted [facility]) injection Inject into the muscle. 0.5 mL 1   pantoprazole (PROTONIX) 40 MG tablet TAKE 1 TABLET BY  MOUTH ONCE A DAY 30 tablet 5   irbesartan (AVAPRO) 75 MG tablet Take 1 tablet (75 mg total) by mouth daily. 90 tablet 0   Facility-Administered Medications Prior to Visit  Medication Dose Route Frequency Provider Last Rate Last Admin   heparin lock flush 100 unit/mL  500 Units Intracatheter  Daily PRN Truitt Merle, MD       sodium chloride flush (NS) 0.9 % injection 10 mL  10 mL Intracatheter PRN Truitt Merle, MD        Allergies  Allergen Reactions   Niacinamide Itching    Improved upon discontinuation    Thiazide-Type Diuretics Other (See Comments)    Chlorthalidone caused gout   Statins Other (See Comments)    Liver enzymes go up whenever on them    ROS As per HPI  PE: Vitals with BMI 10/14/2020 09/23/2020 07/16/2020  Height - 5' 8"  5' 8.75"  Weight 233 lbs 10 oz 231 lbs 3 oz 223 lbs  BMI 35.53 42.35 36.14  Systolic 431 540 086  Diastolic 80 74 74  Pulse 69 78 72   Gen: Alert, well appearing.  Patient is oriented to person, place, time, and situation. AFFECT: pleasant, lucid thought and speech. CV: RRR, 2/6 systolic murmur heard best at RUSB, no diastolic murmur.  No r/g.   LUNGS: CTA bilat, nonlabored resps, good aeration in all lung fields. EXT: no clubbing or cyanosis.  3+ bilat LL pitting edema.    LABS:  Lab Results  Component Value Date   TSH 1.19 05/04/2016   Lab Results  Component Value Date   WBC 17.5 08/06/2020   HGB 13.3 (A) 08/06/2020   HCT 41 08/06/2020   MCV 98.5 06/27/2019   PLT 300 08/06/2020   Lab Results  Component Value Date   IRON 57 06/27/2019   TIBC 244 06/27/2019   FERRITIN 203 06/27/2019   Lab Results  Component Value Date   CREATININE 1.5 (A) 08/06/2020   BUN 13 08/06/2020   NA 134 (A) 08/06/2020   K 5.0 08/06/2020   CL 100 08/06/2020   CO2 24 (A) 08/06/2020   Lab Results  Component Value Date   ALT 18 08/06/2020   AST 19 08/06/2020   ALKPHOS 106 08/06/2020   BILITOT 1.0 11/27/2019   Lab Results  Component Value Date   CHOL  162 01/25/2017   Lab Results  Component Value Date   HDL 47 01/25/2017   Lab Results  Component Value Date   LDLCALC 100 (H) 01/25/2017   Lab Results  Component Value Date   TRIG 76 01/25/2017   Lab Results  Component Value Date   CHOLHDL 3.4 01/25/2017   Lab Results  Component Value Date   PSA 1.06 03/23/2020   PSA 4.27 (H) 11/27/2019   PSA 0.11 05/15/2018   Lab Results  Component Value Date   HGBA1C 5.4 11/27/2019   IMPRESSION AND PLAN:  1) Chronic LE edema, pt with known DD. Lasix 7m qd did helps some but it reaccumulated since d/c of this med.  Cont low Na and fluid limitation. Will check renal function/lytes today and if stable I'll start daily 258mlasix dose and f/u bmet 1 wk. Echo in near future, ordered by Dr. KeClaiborne Billings 2) HTN: well controlled on irbesartan 7518md, amoldipine 5 qd, and lopressor 50 bid.  3) CAD, goal LDL <70.  LDL was 100 at last check in 2018. Pt with hx of statin intolerance.   Lipid panel today, consider zetia.  4) CRI III: baseline sCr 1.3-1.5. Avoids NSAIDs, hydration adequate. Lytes/cr and urine microalb/cr today.  An After Visit Summary was printed and given to the patient.  FOLLOW UP: Return in about 3 months (around 01/13/2021) for annual CPE (fasting) +RCI.  Signed:  PhiCrissie SicklesD  10/14/2020  

## 2020-10-16 ENCOUNTER — Other Ambulatory Visit (HOSPITAL_COMMUNITY): Payer: Self-pay

## 2020-10-21 ENCOUNTER — Other Ambulatory Visit: Payer: Self-pay

## 2020-10-21 ENCOUNTER — Telehealth: Payer: Self-pay

## 2020-10-21 ENCOUNTER — Ambulatory Visit (HOSPITAL_COMMUNITY): Payer: 59 | Attending: Cardiovascular Disease

## 2020-10-21 ENCOUNTER — Other Ambulatory Visit (HOSPITAL_COMMUNITY): Payer: Self-pay

## 2020-10-21 DIAGNOSIS — I5032 Chronic diastolic (congestive) heart failure: Secondary | ICD-10-CM | POA: Insufficient documentation

## 2020-10-21 DIAGNOSIS — I2510 Atherosclerotic heart disease of native coronary artery without angina pectoris: Secondary | ICD-10-CM | POA: Diagnosis not present

## 2020-10-21 DIAGNOSIS — N183 Chronic kidney disease, stage 3 unspecified: Secondary | ICD-10-CM

## 2020-10-21 LAB — ECHOCARDIOGRAM COMPLETE
AR max vel: 1.63 cm2
AV Area VTI: 1.73 cm2
AV Area mean vel: 1.59 cm2
AV Mean grad: 14 mmHg
AV Peak grad: 24 mmHg
Ao pk vel: 2.45 m/s
Area-P 1/2: 2.99 cm2
S' Lateral: 3.4 cm

## 2020-10-21 MED ORDER — FUROSEMIDE 20 MG PO TABS
20.0000 mg | ORAL_TABLET | Freq: Every day | ORAL | 1 refills | Status: DC
  Filled 2020-10-21: qty 90, 90d supply, fill #0

## 2020-10-21 MED ORDER — PERFLUTREN LIPID MICROSPHERE
1.0000 mL | INTRAVENOUS | Status: AC | PRN
  Administered 2020-10-21: 3 mL via INTRAVENOUS

## 2020-10-21 NOTE — Addendum Note (Signed)
Addended by: Deveron Furlong D on: 10/21/2020 02:52 PM   Modules accepted: Orders

## 2020-10-21 NOTE — Telephone Encounter (Signed)
-----   Message from Tammi Sou, MD sent at 10/21/2020 11:07 AM EDT ----- All labs excellent except cholesterol numbers all elevated. Weighing the risks and benefits of medication treatment for this, I recommend against any meds at this time.

## 2020-10-21 NOTE — Telephone Encounter (Signed)
Spoke with patient's wife, [name] regarding results/recommendations,voiced understanding. During last OV 9/7, "Will check renal function/lytes today and if stable I'll start daily 38m lasix dose and f/u bmet 1 wk"   Please advise on starting lasix

## 2020-10-21 NOTE — Telephone Encounter (Signed)
Pt's advised medication would be started but follow up lab visit in 7-10 days. Offered to schedule appt, agreed and set for 9/21. Rx sent to preferred pharmacy.

## 2020-10-21 NOTE — Telephone Encounter (Signed)
Yes, start furosemide 77m, 1 tab po qd, #90, RF x 1. Nonfasting lab appt in 7-10d to check BMET, dx chronic renal insufficiency stage III.-thx

## 2020-10-22 ENCOUNTER — Other Ambulatory Visit (HOSPITAL_COMMUNITY): Payer: Self-pay

## 2020-10-24 ENCOUNTER — Other Ambulatory Visit (HOSPITAL_COMMUNITY): Payer: Self-pay

## 2020-10-24 MED FILL — Metoprolol Tartrate Tab 50 MG: ORAL | 90 days supply | Qty: 180 | Fill #1 | Status: AC

## 2020-10-28 ENCOUNTER — Other Ambulatory Visit (HOSPITAL_COMMUNITY): Payer: Self-pay

## 2020-10-28 ENCOUNTER — Ambulatory Visit (INDEPENDENT_AMBULATORY_CARE_PROVIDER_SITE_OTHER): Payer: 59

## 2020-10-28 ENCOUNTER — Other Ambulatory Visit: Payer: Self-pay

## 2020-10-28 DIAGNOSIS — N183 Chronic kidney disease, stage 3 unspecified: Secondary | ICD-10-CM | POA: Diagnosis not present

## 2020-10-28 LAB — BASIC METABOLIC PANEL
BUN: 20 mg/dL (ref 6–23)
CO2: 28 mEq/L (ref 19–32)
Calcium: 9.4 mg/dL (ref 8.4–10.5)
Chloride: 99 mEq/L (ref 96–112)
Creatinine, Ser: 1.39 mg/dL (ref 0.40–1.50)
GFR: 51.14 mL/min — ABNORMAL LOW (ref >60.00)
Glucose, Bld: 122 mg/dL — ABNORMAL HIGH (ref 70–99)
Potassium: 4.8 mEq/L (ref 3.5–5.1)
Sodium: 135 mEq/L (ref 135–145)

## 2020-10-28 MED FILL — Everolimus Tab 1 MG: ORAL | 30 days supply | Qty: 90 | Fill #4 | Status: AC

## 2020-11-02 ENCOUNTER — Other Ambulatory Visit (HOSPITAL_COMMUNITY): Payer: Self-pay

## 2020-11-04 DIAGNOSIS — I77810 Thoracic aortic ectasia: Secondary | ICD-10-CM | POA: Diagnosis not present

## 2020-11-04 DIAGNOSIS — I350 Nonrheumatic aortic (valve) stenosis: Secondary | ICD-10-CM | POA: Diagnosis not present

## 2020-11-05 DIAGNOSIS — J849 Interstitial pulmonary disease, unspecified: Secondary | ICD-10-CM | POA: Diagnosis not present

## 2020-11-05 DIAGNOSIS — I700 Atherosclerosis of aorta: Secondary | ICD-10-CM | POA: Diagnosis not present

## 2020-11-05 DIAGNOSIS — M109 Gout, unspecified: Secondary | ICD-10-CM | POA: Diagnosis not present

## 2020-11-05 DIAGNOSIS — I2510 Atherosclerotic heart disease of native coronary artery without angina pectoris: Secondary | ICD-10-CM | POA: Diagnosis not present

## 2020-11-05 DIAGNOSIS — Z951 Presence of aortocoronary bypass graft: Secondary | ICD-10-CM | POA: Diagnosis not present

## 2020-11-05 DIAGNOSIS — K7681 Hepatopulmonary syndrome: Secondary | ICD-10-CM | POA: Diagnosis not present

## 2020-11-05 DIAGNOSIS — R0602 Shortness of breath: Secondary | ICD-10-CM | POA: Diagnosis not present

## 2020-11-05 DIAGNOSIS — J84111 Idiopathic interstitial pneumonia, not otherwise specified: Secondary | ICD-10-CM | POA: Diagnosis not present

## 2020-11-05 DIAGNOSIS — R0989 Other specified symptoms and signs involving the circulatory and respiratory systems: Secondary | ICD-10-CM | POA: Diagnosis not present

## 2020-11-06 ENCOUNTER — Other Ambulatory Visit (HOSPITAL_COMMUNITY): Payer: Self-pay

## 2020-11-10 NOTE — Progress Notes (Signed)
Cardiology Clinic Note   Patient Name: William [name] Date of Encounter: 11/13/2020  Primary Care Provider:  Tammi Sou, MD Primary Cardiologist:  Shelva Majestic, MD  Patient Profile    William [name] 70 year old male presents to clinic today for follow-up evaluation of his coronary artery disease and chronic diastolic CHF.  Past Medical History    Past Medical History:  Diagnosis Date   AAA (abdominal aortic aneurysm) 03/2014   repaired 2020   Ascending aortic aneurysm    unrepaired. Stable at 4.3 cm on imaging by [facility] thoracic surg 10/2019. F/u w/vasc surg 2022.   Bilateral renal cysts    simple (03/2014 MRI)   CAD (coronary artery disease)    Cholelithiases 2018   asymptomatic   Chronic diastolic heart failure (HCC)    Chronic renal insufficiency, stage 3 (moderate) ([place]) 2022   baseline sCr 1.3-1.5 (GFR 55)   COVID-19 virus infection 02/2020   sotrovimab infusion   Diabetes mellitus with complication ([place]) 51/0258   A1c 6.8%. A1c 5.4% 11/2019   Gout    initially dx'd by diag arthrocentesis of elbow effusion in winter 2021, 3 episodes, all came after being started on chlorthalidone->I d/c'd chlorthal.   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  Improving + off oxygen as of 3 mo s/p liver transplant.  Doing well/resolved as of 02/2020 [facility] pulm f/u->they'll continue to follow.   History of blood transfusion 2018 X 4 dates   Chronic GI blood loss of unknown location despite full GI endoscopic eval, etc   History of liver transplant ([place]) 07/2019   [facility]   Hyperlipemia, mixed    elevated LFTs when on statins.     Hypertension    Cr bump 04/01/16 so I changed benicar-hct to benicar plain and added amlodipine 5 mg.   Increased prostate specific antigen (PSA) velocity 2021   0.11 to 4.77 from 2020 to 2021 (he got a liver transplant and was put on antirejection meds between these psa checks).   Iron deficiency anemia 05/2016   Acute blood loss anemia: hospitalized,  required transfusion x 3 U: colonoscopy and capsule study unrevealing.  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  W/u for ? hemolytic anemia to be pursued by hematologist as outpt.  Dr. [name], GI at [facility] following, too---he rec'd onc do bone marrow bx as of Jan 2019   Iron deficiency anemia due to chronic blood loss 2018/19   GI: transfusions x >20 required; multiple endoscopies and bleeding scans unrevealing. Lysteda and octreotide + monthly iron infusions as of 04/2017. Iron infusions changed to every other month as of 09/2018 hem f/u.   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  Changes noted on all CXRs in 2018/2019.  Liver Transplant eval 03/2018->mild restriction on PFTs but no obstruction.  See further details in PMH "pulm fibrosis" section   Microscopic hematuria    Eval unremarkable by Dr. Eulogio Ditch.   Nonmelanoma skin cancer 08/15/2018   2020 BCC nose, excised.  2022 R side of neck->mohs   Obesity    Pulmonary fibrosis (HCC)    PFTs: restrictive lung dz ([facility] Liver transplant eval 05/2018). [facility] pulm eval felt this was likely hepatopulmonary syndrome->causing  his hypoxia->low likelihood of progression (as of 10/04/18 transplant clinic f/u). Pulm rehab helping as of 2020/2021. OFF oxygen as of 10/2019 transp f/u.   Spleen enlarged    Past Surgical History:  Procedure Laterality Date   ABDOMINAL AORTIC ANEURYSM REPAIR  08/23/2018   [facility]   CARDIAC CATHETERIZATION     CARDIOVASCULAR STRESS TEST  01/17/2012; 05/04/16   Normal stress nuclear study x 2 (2018--Normal perfusion. LVEF 66% with normal wall motion. This is a low risk study).   CERVICAL DISCECTOMY  1992   COLONOSCOPY N/A 05/27/2016    No site/explanation for blood loss found.  Erythematous mucosa in cecum and ascending colon--Cecal bx: normal.  Procedure: COLONOSCOPY;  Surgeon: Carol Ada, MD;  Location: [facility] ENDOSCOPY;  Service: Endoscopy;  Laterality: N/A;   COLONOSCOPY W/ POLYPECTOMY  09/10/2014   Polypectomy x 2: recall 5 yrs (Dr. [name]).   CORONARY ANGIOPLASTY     CORONARY ARTERY BYPASS GRAFT  03/2006   CT ABDOMEN WITH CONTRAST ([place] [facility])  11/2018   CT CHEST WWO CONTAST ([place] HX)  07/11/2019   No significant change in interstitial lung disease with possible underlying hepatopulmonary syndrome, Increased ascites in the setting of cirrhosis, tiny new right basilar nodule   DEXA  03/07/2019   T score 0.2/NORMAL   ENTEROSCOPY N/A 07/31/2016   Procedure: ENTEROSCOPY;  Surgeon: Ladene Artist, MD;  Location: [facility] ENDOSCOPY;  Service: Endoscopy;  Laterality: N/A;   ENTEROSCOPY N/A 12/02/2016   Procedure: ENTEROSCOPY;  Surgeon: Carol Ada, MD;  Location: [facility] ENDOSCOPY;  Service: Endoscopy;  Laterality: N/A;   ESOPHAGOGASTRODUODENOSCOPY N/A 05/25/2016   No site/explanation for blood loss found.  Procedure: ESOPHAGOGASTRODUODENOSCOPY (EGD);  Surgeon: Juanita Craver, MD;  Location: [facility] ENDOSCOPY;  Service: Endoscopy;  Laterality: N/A;   ESOPHAGOGASTRODUODENOSCOPY N/A 09/23/2016   Procedure: ESOPHAGOGASTRODUODENOSCOPY (EGD);  Surgeon: Carol Ada, MD;  Location: [place];  Service: Endoscopy;  Laterality: N/A;   ESOPHAGOGASTRODUODENOSCOPY (EGD) WITH PROPOFOL N/A 08/21/2018   Procedure: ESOPHAGOGASTRODUODENOSCOPY (EGD) WITH PROPOFOL;  Surgeon: Carol Ada, MD;  Location: [facility] ENDOSCOPY;  Service: Endoscopy;  Laterality: N/A;   GIVENS CAPSULE STUDY N/A 05/27/2016   No source identified.  Repeat 10/2016--results pending.  Procedure: GIVENS CAPSULE STUDY;  Surgeon: Carol Ada, MD;  Location: [facility] ENDOSCOPY;  Service: Endoscopy;  Laterality: N/A;   GIVENS CAPSULE STUDY N/A 10/15/2016   Procedure: GIVENS CAPSULE STUDY;  Surgeon: Carol Ada, MD;  Location: [facility] ENDOSCOPY;  Service: Endoscopy;  Laterality: N/A;   GIVENS CAPSULE STUDY N/A 02/13/2017   Procedure: GIVENS CAPSULE STUDY;  Surgeon: Carol Ada, MD;  Location: [place];  Service: Endoscopy;  Laterality: N/A;   LIVER TRANSPLANT  08/02/2019   For NASH cirrhosis. [facility]   NM GI BLOOD LOSS  01/27/2017   NEGATIVE   SMALL BOWEL ENTEROSCOPY  10/2016   ([facility], Dr. [name]: Double balloon enteroscopy--to the level of proximal ileum) jejunal polyps- which were removed--not bleeding & benign.  No source of bleeding was identified.   TRANSTHORACIC ECHOCARDIOGRAM  05/25/2016; 10/2017   05/2016: EF 60%, normal wall motion, grd II DD, mild aortic stenosis, dilated aortic root and ascending aorta. 10/2017: Mild LVH; no mention made of LV function.  Moderate aorticstenosis with mean gradient 19 peak gradient 41. AVA 1.1 cm^2.  05/2018 EF 55%,4.3 cm ascend aneur, mild AS.   VASECTOMY  1977    Allergies  Allergies  Allergen Reactions   Niacinamide Itching    Improved upon discontinuation    Thiazide-Type Diuretics  Other (See Comments)    Chlorthalidone caused gout   Statins Other (See Comments)    Liver enzymes go up whenever on them    History of Present Illness    William [name] has a PMH of coronary artery disease status post CABG (LIMA-LAD, SVG-RCA, SVG-circumflex 2/08), obesity, metabolic syndrome, mixed hyperlipidemia, and AAA status post endovascular aneurysm repair 8/20.  His PMH also includes moderate aortic stenosis, CKD stage III, and liver transplant.   He developed pneumonia 2/20 and his bilirubin further increased to 9-10.  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  August 2020 he underwent EVAR for abdominal aortic aneurysm at [facility].  He was followed by [facility] and underwent liver transplant 6/21.  He did well following his transplant and was continued on prednisone and tacrolimus for immunosuppression.   Followed up with Dr. Claiborne Billings 12/30/2019  and denied shortness of breath and chest discomfort.  He stated that his blood pressure at home was typically running in the 458-099 range systolic.  He reported that his creatinine had increased to 4 after his liver transplant and decreased to around 1.31.  His blood pressure was 152/90 with a pulse of 75.  His EKG showed normal sinus rhythm with incomplete right bundle branch block.  His chlorthalidone was restarted.  His prior echocardiogram showed moderate aortic valve stenosis.  He denied presyncope and syncope or exertional dyspnea.  Follow-up was planned for 6 months.   He presented to the clinic 09/23/2020 for follow-up evaluation stated he felt well.  He had noticed some increased lower extremity swelling over the last several months.  His weight had increased about 8 pounds since May.  He reported that he did try to limit his salt intake and was drinking around 4 bottles of water per day.  He noticed that his leg swelling would decreased when he woke up in the morning and would accumulate through the day.  He remained somewhat physically active around his house but reported that he was not as physically active as he was before he had gout and COVID.  He had seen orthopedics who told him he also had arthritis.  He denied recurrence of chest discomfort and shortness of breath.  He denied orthopnea and PND.  I prescribed furosemide 20 mg x 3 days with plan for discontinuing after dosing.    Ordered a BMP  1 week after.  Ordered an echocardiogram, gave the salty 6 diet sheet, I asked him to restrict his fluids to less than 64 ounces and follow-up in 1 to 2 months.  His echocardiogram showed an EF 60-65%, trivial mitral valve regurgitation, and moderate aortic valve calcification with trivial regurgitation.  He presents to the clinic today for follow-up evaluation states he feels well.  He has been using his chainsaw and been more active around his property.  He reports he is also walking more and going up  and down stairs more.  We reviewed his echocardiogram.  He was recently at [facility] for cardiac MRI which she reports was also normal.  He has been trying to avoid salt in his diet.  He has been using lower extremity support stockings.  He believes that the lower support stockings are working well for reducing his lower extremity swelling.  He also was seen by his PCP who continued his furosemide 20 mg daily.  I will give him a salty 6 diet sheet, have him continue his lower extremity support stockings, have him increase his physical activity as tolerated  and plan follow-up for 6 to 9 months.   Today he denies chest pain, shortness of breath, lower extremity edema, fatigue, palpitations, melena, hematuria, hemoptysis, diaphoresis, weakness, presyncope, syncope, orthopnea, and PND.  Home Medications    Prior to Admission medications   Medication Sig Start Date End Date Taking? Authorizing Provider  amLODipine (NORVASC) 5 MG tablet Take 1 tablet (5 mg total) by mouth daily. 07/29/20   Tammi Sou, MD  aspirin EC 81 MG tablet Take 81 mg by mouth daily. Swallow whole.    [provider]  COVID-19 mRNA vaccine, Moderna, 100 MCG/0.5ML injection Inject into the muscle. 07/20/20   Carlyle Basques, MD  furosemide (LASIX) 20 MG tablet Take 1 tablet (20 mg total) by mouth daily. 10/21/20   McGowen, Adrian Blackwater, MD  irbesartan (AVAPRO) 75 MG tablet Take 1 tablet (75 mg total) by mouth daily. 10/14/20   McGowen, Adrian Blackwater, MD  metoprolol tartrate (LOPRESSOR) 50 MG tablet TAKE 1 TABLET BY MOUTH 2 TIMES DAILY 04/24/20 04/24/21  McGowen, Adrian Blackwater, MD  pantoprazole (PROTONIX) 40 MG tablet TAKE 1 TABLET BY MOUTH ONCE A DAY 08/13/19 09/03/20  [name], [name], MD  pantoprazole (PROTONIX) 40 MG tablet Take 1 tablet (40 mg total) by mouth once daily 10/06/20     tacrolimus (PROGRAF) 1 MG capsule Take 2 capsules by mouth every morning AND 1 capsule every evening 09/22/20     triamcinolone (KENALOG) 0.1 % APPLY TO THE RASH 2  TIMES DAILY AS DIRECTED. FOLLOW WITH [employer]ER[employer]. STOP WHEN SMOOTH 04/23/20 04/23/21    ZORTRESS 1 MG TABS TAKE 2 TABLETS (2 MG) BY MOUTH EVERY MORNING, THEN 1 TABLET (1MG) NIGHTLY. 05/08/20 05/08/21  Teena Irani, MD  Zoster Vaccine Adjuvanted [facility]) injection Inject into the muscle. 10/06/20   Carlyle Basques, MD    Family History    Family History  Problem Relation Age of Onset   Hypertension Mother    Cancer - Other Mother        liver cancer   Heart disease Father    Heart attack Father    Cancer - Lung Father    Diabetes Father    Liver disease Sister    Anemia Sister    Heart disease Sister    Heart attack Sister    Heart disease Brother        CABG 43 YEARS AGO   Hypertension Brother    Mesothelioma Brother        HALF-BROTHER   Cancer - Other Brother        CLL   Cancer - Lung Brother        Mets to brain   Cancer - Lung Sister        HALF-SISTER   Liver disease Brother    Heart disease Brother        CABG-2012   Emphysema Maternal Grandfather    Cancer - Other Paternal Grandmother        Stomach   Heart attack Paternal Grandfather    He indicated that his mother is deceased. He indicated that his father is deceased. He indicated that two of his three sisters are alive. He indicated that all of his four brothers are deceased. He indicated that his maternal grandmother is deceased. He indicated that his maternal grandfather is deceased. He indicated that his paternal grandmother is deceased. He indicated that his paternal grandfather is deceased.  Social History    Social History   Socioeconomic History   Marital  status: Married    Spouse name: Not on file   Number of children: Not on file   Years of education: Not on file   Highest education level: Not on file  Occupational History   Not on file  Tobacco Use   Smoking status: Former    Types: Cigarettes   Smokeless tobacco: Never   Tobacco comments:    QUIT I 1983  Vaping Use   Vaping Use: Never  used  Substance and Sexual Activity   Alcohol use: No   Drug use: No   Sexual activity: Yes  Other Topics Concern   Not on file  Social History Narrative   Married, 3 grown children, 3 GCs.   Educ: 10th grade.   Occupation: Retired Brewing technologist.   Tob: quit 1983, smoked about 20 pack-yr hx prior.   No alcohol.   Social Determinants of Health   Financial Resource Strain: Not on file  Food Insecurity: Not on file  Transportation Needs: Not on file  Physical Activity: Not on file  Stress: Not on file  Social Connections: Not on file  Intimate Partner Violence: Not on file     Review of Systems    General:  No chills, fever, night sweats or weight changes.  Cardiovascular:  No chest pain, dyspnea on exertion, edema, orthopnea, palpitations, paroxysmal nocturnal dyspnea. Dermatological: No rash, lesions/masses Respiratory: No cough, dyspnea Urologic: No hematuria, dysuria Abdominal:   No nausea, vomiting, diarrhea, bright red blood per rectum, melena, or hematemesis Neurologic:  No visual changes, wkns, changes in mental status. All other systems reviewed and are otherwise negative except as noted above.  Physical Exam    VS:  BP 110/78 (BP Location: Left Arm, Patient Position: Sitting, Cuff Size: Normal)   Pulse 72   Ht 5' 8"  (1.727 m)   Wt 232 lb (105.2 kg)   BMI 35.28 kg/m  , BMI Body mass index is 35.28 kg/m. GEN: Well nourished, well developed, in no acute distress. HEENT: normal. Neck: Supple, no JVD, carotid bruits, or masses. Cardiac: RRR, 3/6 systolic murmur heard along right sternal border,, rubs, or gallops. No clubbing, cyanosis, generalized bilateral lower extremity nonpitting edema.  Radials/DP/PT 2+ and equal bilaterally.  Respiratory:  Respirations regular and unlabored, clear to auscultation bilaterally. GI: Soft, nontender, nondistended, BS + x 4. MS: no deformity or atrophy. Skin: warm and dry, no rash. Neuro:  Strength and sensation are  intact. Psych: Normal affect.  Accessory Clinical Findings    Recent Labs: 08/06/2020: ALT 18; Hemoglobin 13.3; Platelets 300 10/28/2020: BUN 20; Creatinine, Ser 1.39; Potassium 4.8; Sodium 135   Recent Lipid Panel    Component Value Date/Time   CHOL 205 (H) 10/14/2020 0928   CHOL 162 01/25/2017 0000   CHOL 135 11/29/2013 0940   TRIG 274.0 (H) 10/14/2020 0928   TRIG 177 (H) 11/29/2013 0940   HDL 27.30 (L) 10/14/2020 0928   HDL 47 01/25/2017 0000   HDL 34 (L) 11/29/2013 0940   CHOLHDL 8 10/14/2020 0928   VLDL 54.8 (H) 10/14/2020 0928   LDLCALC 100 (H) 01/25/2017 0000   LDLCALC 66 11/29/2013 0940   LDLDIRECT 142.0 10/14/2020 0928    ECG personally reviewed by me today-none today.  EKG 09/23/2020 Sinus rhythm first-degree AV block incomplete right bundle branch block anterior septal infarct undetermined age 37 bpm- No acute changes  Nuclear stress test 05/04/2016 The left ventricular ejection fraction is hyperdynamic (>65%). Nuclear stress EF: 66%. No T wave inversion was noted during  stress. There was no ST segment deviation noted during stress. This is a low risk study.   Normal perfusion. LVEF 66% with normal wall motion. This is a low risk study.     Echocardiogram 10/11/2017 Study Conclusions   - Left ventricle: Wall thickness was increased in a pattern of mild    LVH.  - Aortic valve: There was moderate stenosis. Valve area (VTI): 1.42    cm^2. Valve area (Vmax): 1.24 cm^2. Valve area (Vmean): 1.11    cm^2.  - Left atrium: The atrium was mildly dilated.  - Impressions: Mean gradient slightly lower than 2018.   Impressions:   - Mean gradient slightly lower than 2018.   -------------------------------------------------------------------  Labs, prior tests, procedures, and surgery:  Coronary artery bypass grafting.   -------------------------------------------------------------------  Study data:  Comparison was made to the study of 05/19/2016.  Study  status:   Routine.  Procedure:  The patient reported no pain pre or  post test. Transthoracic echocardiography. Image quality was  adequate.  Study completion:  There were no complications.  Echocardiography.  M-mode, complete 2D, spectral Doppler, and color  Doppler.  Birthdate:  Patient birthdate: 12-Jul-1950.  Age:  Patient  is 70 yr old.  Sex:  Gender: male.    BMI: 35.2 kg/m^2.  Blood  pressure:     129/76  Patient status:  Outpatient.  Study date:  Study date: 10/11/2017. Study time: 11:33 AM.  Location:  [facility]   -------------------------------------------------------------------   -------------------------------------------------------------------  Left ventricle:   Wall thickness was increased in a pattern of mild  LVH.   -------------------------------------------------------------------  Aortic valve:   Trileaflet; moderately thickened, moderately  calcified leaflets.  Doppler:   There was moderate stenosis.  VTI ratio of LVOT to aortic valve: 0.41. Valve area (VTI): 1.42  cm^2. Indexed valve area (VTI): 0.61 cm^2/m^2. Peak velocity ratio  of LVOT to aortic valve: 0.36. Valve area (Vmax): 1.24 cm^2.  Indexed valve area (Vmax): 0.54 cm^2/m^2. Mean velocity ratio of  LVOT to aortic valve: 0.32. Valve area (Vmean): 1.11 cm^2. Indexed  valve area (Vmean): 0.48 cm^2/m^2.    Mean gradient (S): 19 mm Hg.  Peak gradient (S): 41 mm Hg.   -------------------------------------------------------------------  Aorta:  Ascending aorta: The ascending aorta was mildly dilated.   -------------------------------------------------------------------  Mitral valve:   Mildly thickened leaflets .  Doppler:  There was  trivial regurgitation.    Valve area by pressure half-time: 3.38  cm^2. Indexed valve area by pressure half-time: 1.46 cm^2/m^2.  Peak gradient (D): 5 mm Hg.   -------------------------------------------------------------------  Left atrium:  The atrium was mildly dilated.    -------------------------------------------------------------------  Atrial septum:  Poorly visualized.   -------------------------------------------------------------------  Right ventricle:  The cavity size was normal. Wall thickness was  normal. Systolic function was normal.   -------------------------------------------------------------------  Pulmonic valve:    Doppler:  There was mild regurgitation.   -------------------------------------------------------------------  Tricuspid valve:   Doppler:  There was mild regurgitation.   -------------------------------------------------------------------  Right atrium:  The atrium was normal in size.   -------------------------------------------------------------------  Pericardium:  The pericardium was normal in appearance.   -------------------------------------------------------------------  Systemic veins:  Inferior vena cava: The vessel was normal in size. The  respirophasic diameter changes were in the normal range (>= 50%),  consistent with normal central venous pressure.   Echocardiogram 10/21/2020 IMPRESSIONS     1. Left ventricular ejection fraction, by estimation, is 60 to 65%. The  left ventricle has normal function. The left  ventricle has no regional  wall motion abnormalities. Left ventricular diastolic parameters were  normal.   2. Right ventricular systolic function is normal. The right ventricular  size is normal.   3. The mitral valve is grossly normal. Trivial mitral valve  regurgitation.   4. The aortic valve is calcified. There is moderate calcification of the  aortic valve. There is moderate thickening of the aortic valve. Aortic  valve regurgitation is trivial.  Assessment & Plan   1.  Chronic diastolic CHF-generalized bilateral lower extremity nonpitting edema.  No increased DOE or activity tolerance.  Follow-up 10/21/2020 echocardiogram showed normal LVEF, trivial mitral valve regurgitation, and  trivial aortic valve regurgitation.  Echocardiogram 10/11/2017 showed mild LVH, moderate aortic stenosis with a mean gradient of 19 mmHg and mild ascending aortic dilation.  Responded well to diuresis and BMP remained stable. Continue irbesartan, metoprolol, irbesartan Heart healthy low-sodium diet-salty 6 given Increase physical activity as tolerated Continue fluid restriction, daily weights, and lower extremity support stockings  Coronary artery disease-no chest pain today.    Status post CABG (LIMA-LAD, SVG-RCA, SVG-circumflex 2/08).Underwent stress testing 3/18 which showed low risk and no ischemia. Continue aspirin, metoprolol, amlodipine Heart healthy low-sodium diet-salty 6 given Increase physical activity as tolerated   Essential hypertension-BP today 110/78.  Well-controlled at home. Continue  metoprolol, amlodipine, irbesartan Heart healthy low-sodium diet-salty 6 given Increase physical activity as tolerated  Moderate aortic stenosis-stable 3/6 systolic murmur.  Continues to be fairly physically active and denies recent increase in DOE or activity intolerance.  Follow-up echocardiogram showed normal LVEF with trivial mitral valve and aortic valve regurgitation.   Ascending aortic dilation-denies episodes of chest or back discomfort/pain.  Blood pressure continues to be well controlled. Maintain blood pressure control   Abdominal aortic aneurysm-status post [facility] at [facility] 8/20 Follows with [facility]  Disposition: Follow-up with Dr. Claiborne Billings in 6-9 months.  Jossie Ng. Doni Bacha NP-C    11/13/2020, 8:47 AM [place] [address] Office [phone] Fax [phone]  Notice: This dictation was prepared with Dragon dictation along with smaller phrase technology. Any transcriptional errors that result from this process are unintentional and may not be corrected upon review.  I spent 13 minutes examining this patient, reviewing medications, and using  patient centered shared decision making involving her cardiac care.  Prior to her visit I spent greater than 20 minutes reviewing her past medical history,  medications, and prior cardiac tests.

## 2020-11-13 ENCOUNTER — Encounter: Payer: Self-pay | Admitting: General Practice

## 2020-11-13 ENCOUNTER — Other Ambulatory Visit: Payer: Self-pay

## 2020-11-13 ENCOUNTER — Ambulatory Visit (INDEPENDENT_AMBULATORY_CARE_PROVIDER_SITE_OTHER): Payer: 59 | Admitting: General Practice

## 2020-11-13 VITALS — BP 110/78 | HR 72 | Ht 68.0 in | Wt 232.0 lb

## 2020-11-13 DIAGNOSIS — I5032 Chronic diastolic (congestive) heart failure: Secondary | ICD-10-CM | POA: Diagnosis not present

## 2020-11-13 DIAGNOSIS — I10 Essential (primary) hypertension: Secondary | ICD-10-CM | POA: Diagnosis not present

## 2020-11-13 DIAGNOSIS — I77810 Thoracic aortic ectasia: Secondary | ICD-10-CM

## 2020-11-13 DIAGNOSIS — I714 Abdominal aortic aneurysm, without rupture, unspecified: Secondary | ICD-10-CM

## 2020-11-13 DIAGNOSIS — I2510 Atherosclerotic heart disease of native coronary artery without angina pectoris: Secondary | ICD-10-CM | POA: Diagnosis not present

## 2020-11-13 NOTE — Patient Instructions (Signed)
Medication Instructions:  The current medical regimen is effective;  continue present plan and medications as directed. Please refer to the Current Medication list given to you today.   *If you need a refill on your cardiac medications before your next appointment, please call your pharmacy*  Lab Work:   Testing/Procedures:  NONE    NONE  Special Instructions PLEASE READ AND FOLLOW SALTY 6-ATTACHED-1,854m daily  PLEASE INCREASE PHYSICAL ACTIVITY AS TOLERATED,   Follow-Up: Your next appointment:  6-9 month(s) In Person with TShelva Majestic MD OR IF UNAVAILABLE JFieldale FNP-C   At [facility] you and your health needs are our priority.  As part of our continuing mission to provide you with exceptional heart care, we have created designated Provider Care Teams.  These Care Teams include your primary Cardiologist (physician) and Advanced Practice Providers (APPs -  Physician Assistants and Nurse Practitioners) who all work together to provide you with the care you need, when you need it.            6 SALTY THINGS TO AVOID     1,800MG DAILY

## 2020-11-17 ENCOUNTER — Encounter: Payer: Self-pay | Admitting: Family Medicine

## 2020-11-17 ENCOUNTER — Other Ambulatory Visit (HOSPITAL_COMMUNITY): Payer: Self-pay

## 2020-12-01 DIAGNOSIS — Z944 Liver transplant status: Secondary | ICD-10-CM | POA: Diagnosis not present

## 2020-12-01 DIAGNOSIS — D849 Immunodeficiency, unspecified: Secondary | ICD-10-CM | POA: Diagnosis not present

## 2020-12-03 ENCOUNTER — Other Ambulatory Visit (HOSPITAL_COMMUNITY): Payer: Self-pay

## 2020-12-03 MED FILL — Everolimus Tab 1 MG: ORAL | 30 days supply | Qty: 90 | Fill #5 | Status: AC

## 2020-12-07 ENCOUNTER — Other Ambulatory Visit (HOSPITAL_COMMUNITY): Payer: Self-pay

## 2020-12-08 ENCOUNTER — Other Ambulatory Visit (HOSPITAL_COMMUNITY): Payer: Self-pay

## 2020-12-08 DIAGNOSIS — Z940 Kidney transplant status: Secondary | ICD-10-CM | POA: Diagnosis not present

## 2020-12-09 ENCOUNTER — Other Ambulatory Visit (HOSPITAL_COMMUNITY): Payer: Self-pay

## 2020-12-10 ENCOUNTER — Other Ambulatory Visit (HOSPITAL_COMMUNITY): Payer: Self-pay

## 2020-12-11 ENCOUNTER — Other Ambulatory Visit (HOSPITAL_COMMUNITY): Payer: Self-pay

## 2020-12-14 ENCOUNTER — Other Ambulatory Visit (HOSPITAL_COMMUNITY): Payer: Self-pay

## 2020-12-21 ENCOUNTER — Other Ambulatory Visit (HOSPITAL_COMMUNITY): Payer: Self-pay

## 2020-12-21 MED FILL — Triamcinolone Acetonide Cream 0.1%: CUTANEOUS | 30 days supply | Qty: 80 | Fill #0 | Status: AC

## 2020-12-28 ENCOUNTER — Other Ambulatory Visit (HOSPITAL_COMMUNITY): Payer: Self-pay

## 2020-12-29 ENCOUNTER — Other Ambulatory Visit (HOSPITAL_COMMUNITY): Payer: Self-pay

## 2021-01-01 ENCOUNTER — Other Ambulatory Visit (HOSPITAL_COMMUNITY): Payer: Self-pay

## 2021-01-04 ENCOUNTER — Other Ambulatory Visit (HOSPITAL_BASED_OUTPATIENT_CLINIC_OR_DEPARTMENT_OTHER): Payer: Self-pay

## 2021-01-04 ENCOUNTER — Ambulatory Visit: Payer: 59 | Attending: Internal Medicine

## 2021-01-04 ENCOUNTER — Other Ambulatory Visit (HOSPITAL_COMMUNITY): Payer: Self-pay

## 2021-01-04 DIAGNOSIS — Z23 Encounter for immunization: Secondary | ICD-10-CM

## 2021-01-04 MED ORDER — MODERNA COVID-19 BIVAL BOOSTER 50 MCG/0.5ML IM SUSP
INTRAMUSCULAR | 0 refills | Status: DC
  Filled 2021-01-04: qty 0.5, 1d supply, fill #0

## 2021-01-04 NOTE — Progress Notes (Signed)
   Covid-19 Vaccination Clinic  Name:  HASTON CASEBOLT    MRN: [id number] DOB: 1950/10/02  01/04/2021  Mr. Ryser was observed post Covid-19 immunization for 15 minutes without incident. He was provided with Vaccine Information Sheet and instruction to access the V-Safe system.   Mr. Harbaugh was instructed to call 911 with any severe reactions post vaccine: Difficulty breathing  Swelling of face and throat  A fast heartbeat  A bad rash all over body  Dizziness and weakness   Immunizations Administered     Name Date Dose VIS Date Route   Moderna Covid-19 vaccine Bivalent Booster 01/04/2021  9:41 AM 0.5 mL 09/19/2020 Intramuscular   Manufacturer: Moderna   Lot: [id number]   [place]: [id number]

## 2021-01-10 ENCOUNTER — Other Ambulatory Visit (HOSPITAL_COMMUNITY): Payer: Self-pay

## 2021-01-11 ENCOUNTER — Other Ambulatory Visit (HOSPITAL_COMMUNITY): Payer: Self-pay

## 2021-01-13 ENCOUNTER — Ambulatory Visit (INDEPENDENT_AMBULATORY_CARE_PROVIDER_SITE_OTHER): Payer: 59 | Admitting: Family Medicine

## 2021-01-13 ENCOUNTER — Encounter: Payer: Self-pay | Admitting: Family Medicine

## 2021-01-13 ENCOUNTER — Other Ambulatory Visit: Payer: Self-pay

## 2021-01-13 ENCOUNTER — Other Ambulatory Visit (HOSPITAL_COMMUNITY): Payer: Self-pay

## 2021-01-13 VITALS — BP 120/76 | HR 86 | Temp 97.7°F | Ht 68.0 in | Wt 232.2 lb

## 2021-01-13 DIAGNOSIS — E119 Type 2 diabetes mellitus without complications: Secondary | ICD-10-CM

## 2021-01-13 DIAGNOSIS — I5032 Chronic diastolic (congestive) heart failure: Secondary | ICD-10-CM

## 2021-01-13 DIAGNOSIS — Z0000 Encounter for general adult medical examination without abnormal findings: Secondary | ICD-10-CM | POA: Diagnosis not present

## 2021-01-13 DIAGNOSIS — E782 Mixed hyperlipidemia: Secondary | ICD-10-CM | POA: Diagnosis not present

## 2021-01-13 DIAGNOSIS — N183 Chronic kidney disease, stage 3 unspecified: Secondary | ICD-10-CM

## 2021-01-13 LAB — HEMOGLOBIN A1C: Hgb A1c MFr Bld: 5.9 % (ref 4.6–6.5)

## 2021-01-13 LAB — CBC WITH DIFFERENTIAL/PLATELET
Basophils Absolute: 0.1 10*3/uL (ref 0.0–0.1)
Basophils Relative: 1.6 % (ref 0.0–3.0)
Eosinophils Absolute: 0.3 10*3/uL (ref 0.0–0.7)
Eosinophils Relative: 3.6 % (ref 0.0–5.0)
HCT: 38.7 % — ABNORMAL LOW (ref 39.0–52.0)
Hemoglobin: 12.6 g/dL — ABNORMAL LOW (ref 13.0–17.0)
Lymphocytes Relative: 10.2 % — ABNORMAL LOW (ref 12.0–46.0)
Lymphs Abs: 0.9 10*3/uL (ref 0.7–4.0)
MCHC: 32.5 g/dL (ref 30.0–36.0)
MCV: 79.1 fl (ref 78.0–100.0)
Monocytes Absolute: 1 10*3/uL (ref 0.1–1.0)
Monocytes Relative: 10.5 % (ref 3.0–12.0)
Neutro Abs: 6.8 10*3/uL (ref 1.4–7.7)
Neutrophils Relative %: 74.1 % (ref 43.0–77.0)
Platelets: 378 10*3/uL (ref 150.0–400.0)
RBC: 4.89 Mil/uL (ref 4.22–5.81)
RDW: 19 % — ABNORMAL HIGH (ref 11.5–15.5)
WBC: 9.2 10*3/uL (ref 4.0–10.5)

## 2021-01-13 LAB — COMPREHENSIVE METABOLIC PANEL
ALT: 21 U/L (ref 0–53)
AST: 17 U/L (ref 0–37)
Albumin: 4.3 g/dL (ref 3.5–5.2)
Alkaline Phosphatase: 111 U/L (ref 39–117)
BUN: 17 mg/dL (ref 6–23)
CO2: 26 mEq/L (ref 19–32)
Calcium: 9.7 mg/dL (ref 8.4–10.5)
Chloride: 100 mEq/L (ref 96–112)
Creatinine, Ser: 1.54 mg/dL — ABNORMAL HIGH (ref 0.40–1.50)
GFR: 45.16 mL/min — ABNORMAL LOW (ref >60.00)
Glucose, Bld: 106 mg/dL — ABNORMAL HIGH (ref 70–99)
Potassium: 5 mEq/L (ref 3.5–5.1)
Sodium: 136 mEq/L (ref 135–145)
Total Bilirubin: 0.8 mg/dL (ref 0.2–1.2)
Total Protein: 7 g/dL (ref 6.0–8.3)

## 2021-01-13 LAB — LIPID PANEL
Cholesterol: 222 mg/dL — ABNORMAL HIGH (ref 0–200)
HDL: 28.9 mg/dL — ABNORMAL LOW (ref >39.00)
NonHDL: 193.57
Total CHOL/HDL Ratio: 8
Triglycerides: 280 mg/dL — ABNORMAL HIGH (ref 0.0–149.0)
VLDL: 56 mg/dL — ABNORMAL HIGH (ref 0.0–40.0)

## 2021-01-13 LAB — LDL CHOLESTEROL, DIRECT: Direct LDL: 162 mg/dL

## 2021-01-13 LAB — TSH: TSH: 1.54 u[IU]/mL (ref 0.35–5.50)

## 2021-01-13 MED ORDER — FUROSEMIDE 20 MG PO TABS
20.0000 mg | ORAL_TABLET | Freq: Every day | ORAL | 3 refills | Status: DC
  Filled 2021-01-13: qty 90, 90d supply, fill #0
  Filled 2021-05-11: qty 90, 90d supply, fill #1
  Filled 2021-08-09: qty 90, 90d supply, fill #2
  Filled 2021-11-08: qty 90, 90d supply, fill #3

## 2021-01-13 NOTE — Progress Notes (Signed)
Office Note 01/13/2021  CC:  Chief Complaint  Patient presents with   Annual Exam    Pt is fasting. Labs with [facility] last completed 1 month ago    HPI:  Patient is a 70 y.o. male who is here accompanied by his wife [name] for annual health maintenance exam and 3 mo f/u HTN, CRI IIIa, and DM 2. He has chronic diastolic HF and hx of liver transplant for NASH cirrhosis. A/P as of last visit: "1) Chronic LE edema, pt with known DD. Lasix 67m qd did helps some but it reaccumulated since d/c of this med.  Cont low Na and fluid limitation. Will check renal function/lytes today and if stable I'll start daily 252mlasix dose and f/u bmet 1 wk. Echo in near future, ordered by Dr. KeClaiborne Billings  2) HTN: well controlled on irbesartan 7525md, amoldipine 5 qd, and lopressor 50 bid.   3) CAD, goal LDL <70.  LDL was 100 at last check in 2018. Pt with hx of statin intolerance.   Lipid panel today, consider zetia.   4) CRI III: baseline sCr 1.3-1.5. Avoids NSAIDs, hydration adequate. Lytes/cr and urine microalb/cr today."  INTERIM HX: Feeling well. Says LE swelling stable on lasix 84m108m and Na limitation. He is staying busy.  Trying to get himself motivated to do more, though.  HF: surveillance echo 10/2020 normal EF and normal diast fxn, mod aortic clerosis w/out stenosis or regurg.  DM: home glucoses->no home gluc monitoring. Feet->no sx's of neuropathy..  Home bp monitoring: always normal.  No changes made on pulm f/u 11/05/20. CT surg f/u 11/04/20-->no change in size of aortic root and ascending aorta->rpt imaging planned 18 months.    Past Medical History:  Diagnosis Date   AAA (abdominal aortic aneurysm) 03/2014   repaired 2020   Ascending aortic aneurysm    unrepaired. Stable at 4.3 [facility].   Bilateral renal cysts    simple (03/2014 MRI)   CAD (coronary artery disease)    Cholelithiases 2018   asymptomatic    Chronic diastolic heart failure (HCC)    Chronic renal insufficiency, stage 3 (moderate) (HCC)[id number]   baseline sCr 1.3-1.5 (GFR 55)   COVID-19 virus infection 02/2020   sotrovimab infusion   Diabetes mellitus with complication (HCC)[place]/201/65531c 6.8%. A1c 5.4% 11/2019   Gout    initially dx'd by diag arthrocentesis of elbow effusion in winter 2021, 3 episodes, all came after being started on chlorthalidone->I d/c'd chlorthal.   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  Improving + off oxygen as of 3 mo s/p liver transplant.  Doing well/resolved as of 02/2020 [facility] pulm f/u->they'll continue to follow.   History of blood transfusion 2018 X 4 dates   Chronic GI blood loss of unknown location despite full GI endoscopic eval, etc   History of liver transplant (HCC)[place]/2021   [facility]   Hyperlipemia, mixed    elevated LFTs when on statins.     Hypertension    Cr bump 04/01/16 so I changed benicar-hct to benicar plain and added amlodipine 5 mg.   Increased prostate specific antigen (PSA) velocity 2021   0.11 to 4.77 from 2020 to 2021 (he got a liver transplant and was put on antirejection meds between these psa checks).   Iron deficiency anemia 05/2016   Acute blood loss anemia: hospitalized, required transfusion x 3 U: colonoscopy and capsule study unrevealing.  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  W/u for ? hemolytic anemia to be pursued by hematologist as outpt.  Dr. [name], GI at [facility] following, too---he rec'd onc do bone marrow bx as of Jan 2019   Iron deficiency anemia due to chronic blood loss 2018/19   GI: transfusions x >20 required; multiple endoscopies and bleeding scans unrevealing. Lysteda and octreotide + monthly iron infusions as of 04/2017. Iron infusions changed to every other month as of 09/2018 hem f/u.   Liver cirrhosis secondary to NASH ([place]) 05/2016   Liver transplant 07/2019   Liver  failure ([place]) 2020   NASH cirrhosis   Lung field abnormal finding on examination    Bibasilar L>R mild insp crackles-->x-ray showed mild interstitial changes/fibrosis/scarring.  Changes noted on all CXRs in 2018/2019.  Liver Transplant eval 03/2018->mild restriction on PFTs but no obstruction.  See further details in PMH "pulm fibrosis" section   Microscopic hematuria    Eval unremarkable by Dr. Eulogio Ditch.   Nonmelanoma skin cancer 08/15/2018   2020 BCC nose, excised.  2022 R side of neck->mohs   Obesity    Pulmonary fibrosis (HCC)    PFTs: restrictive lung dz ([facility] eval 05/2018). [facility] pulm eval felt this was likely hepatopulmonary syndrome->causing his hypoxia->low likelihood of progression (as of 10/04/18 transplant clinic f/u). Pulm rehab helping as of 2020/2021. OFF oxygen as of 10/2019 transp f/u.   Spleen enlarged     Past Surgical History:  Procedure Laterality Date   ABDOMINAL AORTIC ANEURYSM REPAIR  08/23/2018   [facility]   CARDIAC CATHETERIZATION     CARDIOVASCULAR STRESS TEST  01/17/2012; 05/04/16   Normal stress nuclear study x 2 (2018--Normal perfusion. LVEF 66% with normal wall motion. This is a low risk study).   CERVICAL DISCECTOMY  1992   COLONOSCOPY N/A 05/27/2016   No site/explanation for blood loss found.  Erythematous mucosa in cecum and ascending colon--Cecal bx: normal.  Procedure: COLONOSCOPY;  Surgeon: Carol Ada, MD;  Location: [facility] ENDOSCOPY;  Service: Endoscopy;  Laterality: N/A;   COLONOSCOPY W/ POLYPECTOMY  09/10/2014   Polypectomy x 2: recall 5 yrs (Dr. [name]).   CORONARY ANGIOPLASTY     CORONARY ARTERY BYPASS GRAFT  03/2006   CT ABDOMEN WITH CONTRAST ([place] HX)  11/2018   CT CHEST WWO CONTAST ([place] HX)  07/11/2019   No significant change in interstitial lung disease with possible underlying hepatopulmonary syndrome, Increased ascites in the setting of cirrhosis, tiny new right basilar nodule   DEXA  03/07/2019   T score 0.2/NORMAL   ENTEROSCOPY  N/A 07/31/2016   Procedure: ENTEROSCOPY;  Surgeon: Ladene Artist, MD;  Location: [facility] ENDOSCOPY;  Service: Endoscopy;  Laterality: N/A;   ENTEROSCOPY N/A 12/02/2016   Procedure: ENTEROSCOPY;  Surgeon: Carol Ada, MD;  Location: [facility] ENDOSCOPY;  Service: Endoscopy;  Laterality: N/A;   ESOPHAGOGASTRODUODENOSCOPY N/A 05/25/2016   No site/explanation for blood loss found.  Procedure: ESOPHAGOGASTRODUODENOSCOPY (EGD);  Surgeon: Juanita Craver, MD;  Location: [facility] ENDOSCOPY;  Service: Endoscopy;  Laterality: N/A;   ESOPHAGOGASTRODUODENOSCOPY N/A 09/23/2016   Procedure: ESOPHAGOGASTRODUODENOSCOPY (EGD);  Surgeon: Carol Ada, MD;  Location: [place];  Service: Endoscopy;  Laterality: N/A;   ESOPHAGOGASTRODUODENOSCOPY (EGD) WITH PROPOFOL N/A 08/21/2018   Procedure: ESOPHAGOGASTRODUODENOSCOPY (EGD) WITH PROPOFOL;  Surgeon: Carol Ada, MD;  Location: [facility] ENDOSCOPY;  Service: Endoscopy;  Laterality: N/A;   GIVENS CAPSULE STUDY N/A 05/27/2016   No source identified.  Repeat 10/2016--results pending.  Procedure: GIVENS CAPSULE STUDY;  Surgeon: Carol Ada, MD;  [facility];  Service: Endoscopy;  Laterality: N/A;   GIVENS CAPSULE STUDY N/A 10/15/2016   Procedure: GIVENS CAPSULE STUDY;  Surgeon: Carol Ada, MD;  Location: [facility] ENDOSCOPY;  Service: Endoscopy;  Laterality: N/A;   GIVENS CAPSULE STUDY N/A 02/13/2017   Procedure: GIVENS CAPSULE STUDY;  Surgeon: Carol Ada, MD;  Location: [place];  Service: Endoscopy;  Laterality: N/A;   LIVER TRANSPLANT  08/02/2019   For NASH cirrhosis. [place]   NM GI BLOOD LOSS  01/27/2017   NEGATIVE   SMALL BOWEL ENTEROSCOPY  10/2016   ([facility], Dr. [name]: Double balloon enteroscopy--to the level of proximal ileum) jejunal polyps- which were removed--not bleeding & benign.  No source of bleeding was identified.   TRANSTHORACIC ECHOCARDIOGRAM  05/25/2016; 10/2017   05/2016: EF 60%, normal wall motion, grd II DD, mild aortic stenosis,  dilated aortic root and ascending aorta. 10/2017: Mild LVH; no mention made of LV function.  Moderate aorticstenosis with mean gradient 19 peak gradient 41. AVA 1.1 cm^2.  05/2018 EF 55%,4.3 cm ascend aneur, mild AS. 10/2020 EF 60-65%, normal diast fxn, mod AV sclerosis w/out sten/regurg.   VASECTOMY  1977    Family History  Problem Relation Age of Onset   Hypertension Mother    Cancer - Other Mother        liver cancer   Heart disease Father    Heart attack Father    Cancer - Lung Father    Diabetes Father    Liver disease Sister    Anemia Sister    Heart disease Sister    Heart attack Sister    Heart disease Brother        CABG 29 YEARS AGO   Hypertension Brother    Mesothelioma Brother        HALF-BROTHER   Cancer - Other Brother        CLL   Cancer - Lung Brother        Mets to brain   Cancer - Lung Sister        HALF-SISTER   Liver disease Brother    Heart disease Brother        CABG-2012   Emphysema Maternal Grandfather    Cancer - Other Paternal Grandmother        Stomach   Heart attack Paternal Grandfather     Social History   Socioeconomic History   Marital status: Married    Spouse name: Not on file   Number of children: Not on file   Years of education: Not on file   Highest education level: Not on file  Occupational History   Not on file  Tobacco Use   Smoking status: Former    Types: Cigarettes   Smokeless tobacco: Never   Tobacco comments:    QUIT I 1983  Vaping Use   Vaping Use: Never used  Substance and Sexual Activity   Alcohol use: No   Drug use: No   Sexual activity: Yes  Other Topics Concern   Not on file  Social History Narrative   Married, 3 grown children, 3 GCs.   Educ: 10th grade.   Occupation: Retired Brewing technologist.   Tob: quit 1983, smoked about 20 pack-yr hx prior.   No alcohol.   Social Determinants of Health   Financial Resource Strain: Not on file  Food Insecurity: Not on file  Transportation  Needs: Not on file   Physical Activity: Not on file  Stress: Not on file  Social Connections: Not on file  Intimate Partner Violence: Not on file    Outpatient Medications Prior to Visit  Medication Sig Dispense Refill   amLODipine (NORVASC) 5 MG tablet Take 1 tablet (5 mg total) by mouth daily. 30 tablet 5   aspirin EC 81 MG tablet Take 81 mg by mouth daily. Swallow whole.     irbesartan (AVAPRO) 75 MG tablet Take 1 tablet (75 mg total) by mouth daily. 90 tablet 3   metoprolol tartrate (LOPRESSOR) 50 MG tablet TAKE 1 TABLET BY MOUTH 2 TIMES DAILY 180 tablet 3   pantoprazole (PROTONIX) 40 MG tablet Take 1 tablet (40 mg total) by mouth once daily 90 tablet 3   tacrolimus (PROGRAF) 1 MG capsule Take 2 capsules by mouth every morning AND 1 capsule every evening 270 capsule 3   triamcinolone cream (KENALOG) 0.1 % APPLY TO THE RASH 2 TIMES DAILY AS DIRECTED. FOLLOW WITH CERAVE CREAM. STOP WHEN SMOOTH 80 g 2   ZORTRESS 1 MG TABS TAKE 2 TABLETS (2 MG) BY MOUTH EVERY MORNING, THEN 1 TABLET (1MG) NIGHTLY. 90 tablet 11   COVID-19 mRNA bivalent vaccine, Moderna, (MODERNA COVID-19 BIVAL BOOSTER) 50 MCG/0.5ML injection Inject into the muscle. 0.5 mL 0   COVID-19 mRNA vaccine, Moderna, 100 MCG/0.5ML injection Inject into the muscle. 0.25 mL 0   furosemide (LASIX) 20 MG tablet Take 1 tablet (20 mg total) by mouth daily. 90 tablet 1   pantoprazole (PROTONIX) 40 MG tablet TAKE 1 TABLET BY MOUTH ONCE A DAY 30 tablet 5   Zoster Vaccine Adjuvanted [facility]) injection Inject into the muscle. 0.5 mL 1   Facility-Administered Medications Prior to Visit  Medication Dose Route Frequency Provider Last Rate Last Admin   heparin lock flush 100 unit/mL  500 Units Intracatheter Daily PRN Truitt Merle, MD       sodium chloride flush (NS) 0.9 % injection 10 mL  10 mL Intracatheter PRN Truitt Merle, MD        Allergies  Allergen Reactions   Niacinamide Itching    Improved upon discontinuation    Thiazide-Type Diuretics Other (See Comments)     Chlorthalidone caused gout   Quinolones Other (See Comments)    CONTRAINDICATED D/T PT HAVING AAA   Statins Other (See Comments)    Liver enzymes go up whenever on them    ROS Review of Systems  Constitutional:  Negative for appetite change, chills, fatigue and fever.  HENT:  Negative for congestion, dental problem, ear pain and sore throat.   Eyes:  Negative for discharge, redness and visual disturbance.  Respiratory:  Negative for cough, chest tightness, shortness of breath and wheezing.   Cardiovascular:  Negative for chest pain, palpitations and leg swelling.  Gastrointestinal:  Negative for abdominal pain, blood in stool, diarrhea, nausea and vomiting.  Genitourinary:  Negative for difficulty urinating, dysuria, flank pain, frequency, hematuria and urgency.  Musculoskeletal:  Negative for arthralgias, back pain, joint swelling, myalgias and neck stiffness.  Skin:  Negative for pallor and rash.  Neurological:  Negative for dizziness, speech difficulty, weakness and headaches.  Hematological:  Negative for adenopathy. Does not bruise/bleed easily.  Psychiatric/Behavioral:  Negative for confusion and sleep disturbance. The patient is not nervous/anxious.    PE; Vitals with BMI 01/13/2021 11/13/2020 10/14/2020  Height 5' 8"  5' 8"  -  Weight 232 lbs 3 oz 232 lbs 233 lbs 10 oz  BMI  35.31 32.12 24.82  Systolic 500 370 488  Diastolic 76 78 80  Pulse 86 72 69   Gen: Alert, well appearing.  Patient is oriented to person, place, time, and situation. AFFECT: pleasant, lucid thought and speech. ENT: Ears: EACs clear, normal epithelium.  TMs with good light reflex and landmarks bilaterally.  Eyes: no injection, icteris, swelling, or exudate.  EOMI, PERRLA. Nose: no drainage or turbinate edema/swelling.  No injection or focal lesion.  Mouth: lips without lesion/swelling.  Oral mucosa pink and moist.  Dentition intact and without obvious caries or gingival swelling.  Oropharynx without  erythema, exudate, or swelling.  Neck: supple/nontender.  No LAD, mass, or TM.  Carotid pulses 2+ bilaterally, without bruits. CV: RRR, no m/r/g.   LUNGS: CTA bilat, nonlabored resps, good aeration in all lung fields. ABD: soft, NT, ND, BS normal.  No hepatospenomegaly or mass.  No bruits. EXT: no clubbing, cyanosis, or edema.  Musculoskeletal: no joint swelling, erythema, warmth, or tenderness.  ROM of all joints intact. Skin - no sores or suspicious lesions or rashes or color changes Foot exam -  no swelling, tenderness or skin or vascular lesions. Color and temperature is normal. Sensation is intact. Peripheral pulses are palpable. Toenails are normal.  Pertinent labs:  Lab Results  Component Value Date   TSH 1.19 05/04/2016   Lab Results  Component Value Date   WBC 17.5 08/06/2020   HGB 13.3 (A) 08/06/2020   HCT 41 08/06/2020   MCV 98.5 06/27/2019   PLT 300 08/06/2020   Lab Results  Component Value Date   IRON 57 06/27/2019   TIBC 244 06/27/2019   FERRITIN 203 06/27/2019   Lab Results  Component Value Date   VITAMINB12 1,240 08/02/2016   Lab Results  Component Value Date   CREATININE 1.39 10/28/2020   BUN 20 10/28/2020   NA 135 10/28/2020   K 4.8 10/28/2020   CL 99 10/28/2020   CO2 28 10/28/2020   Lab Results  Component Value Date   ALT 18 08/06/2020   AST 19 08/06/2020   ALKPHOS 106 08/06/2020   BILITOT 1.0 11/27/2019   Lab Results  Component Value Date   CHOL 205 (H) 10/14/2020   Lab Results  Component Value Date   HDL 27.30 (L) 10/14/2020   Lab Results  Component Value Date   LDLCALC 100 (H) 01/25/2017   Lab Results  Component Value Date   TRIG 274.0 (H) 10/14/2020   Lab Results  Component Value Date   CHOLHDL 8 10/14/2020   Lab Results  Component Value Date   PSA 1.06 03/23/2020   PSA 4.27 (H) 11/27/2019   PSA 0.11 05/15/2018   Lab Results  Component Value Date   HGBA1C 5.4 11/27/2019   ASSESSMENT AND PLAN:   #1 hypertension.   Well-controlled on amlodipine 5 mg a day, Lopressor 50 twice daily, and the irbesartan 75 mg/day. Electrolytes and creatinine today.  2. Diabetes type 2.  A1c's have been great off of medication last several years. Feet exam normal today.  Hemoglobin A1c today.  #3 #3 chronic renal insufficiency stage III.  He avoids NSAIDs.  We are monitoring electrolytes and creatinine closely, patient on ARB and daily Lasix.  4. HLD: hx of statin intolerance.  Pt with hx of liver failure---unless trigs>400 or LDL >130 I think risks of zetia and/or meds for hypertriglyceridemia outweigh benefits.  5. Health maintenance exam: Reviewed age and gender appropriate health maintenance issues (prudent diet, regular exercise, health risks  of tobacco and excessive alcohol, use of seatbelts, fire alarms in home, use of sunscreen).  Also reviewed age and gender appropriate health screening as well as vaccine recommendations. Vaccines:  All utd. Labs: cbc, tsh, cmet, flp, Hba1c. Prostate ca screening: next PSA due after 03/2021. Colon ca screening: UTD 05/2018.  An After Visit Summary was printed and given to the patient.  FOLLOW UP:  Return in about 6 months (around 07/14/2021) for routine chronic illness f/u.  Signed:  Crissie Sickles, MD           01/13/2021

## 2021-01-13 NOTE — Patient Instructions (Signed)

## 2021-01-14 ENCOUNTER — Other Ambulatory Visit (HOSPITAL_COMMUNITY): Payer: Self-pay

## 2021-01-14 ENCOUNTER — Other Ambulatory Visit (INDEPENDENT_AMBULATORY_CARE_PROVIDER_SITE_OTHER): Payer: 59

## 2021-01-14 DIAGNOSIS — D509 Iron deficiency anemia, unspecified: Secondary | ICD-10-CM | POA: Diagnosis not present

## 2021-01-14 LAB — IBC + FERRITIN
Ferritin: 128.4 ng/mL (ref 22.0–322.0)
Iron: 54 ug/dL (ref 42–165)
Saturation Ratios: 16.9 % — ABNORMAL LOW (ref 20.0–50.0)
TIBC: 319.2 ug/dL (ref 250.0–450.0)
Transferrin: 228 mg/dL (ref 212.0–360.0)

## 2021-01-14 MED ORDER — EVEROLIMUS 1 MG PO TABS
ORAL_TABLET | ORAL | 3 refills | Status: DC
  Filled 2021-01-14: qty 60, 30d supply, fill #0

## 2021-01-14 MED FILL — Everolimus Tab 1 MG: ORAL | 30 days supply | Qty: 90 | Fill #6 | Status: CN

## 2021-01-15 ENCOUNTER — Other Ambulatory Visit (HOSPITAL_COMMUNITY): Payer: Self-pay

## 2021-01-15 ENCOUNTER — Telehealth: Payer: Self-pay

## 2021-01-15 DIAGNOSIS — D509 Iron deficiency anemia, unspecified: Secondary | ICD-10-CM

## 2021-01-15 NOTE — Telephone Encounter (Signed)
-----   Message from Tammi Sou, MD sent at 01/15/2021  7:46 AM EST ----- Labs all stable except red blood cell count is down just a tiny bit below normal now. Iron levels normal.  Needs to do hemoccults x 3, dx microcytic anemia.

## 2021-01-20 ENCOUNTER — Other Ambulatory Visit (HOSPITAL_COMMUNITY): Payer: Self-pay

## 2021-01-21 ENCOUNTER — Other Ambulatory Visit (HOSPITAL_COMMUNITY): Payer: Self-pay

## 2021-01-22 ENCOUNTER — Other Ambulatory Visit (HOSPITAL_COMMUNITY): Payer: Self-pay

## 2021-01-23 MED FILL — Metoprolol Tartrate Tab 50 MG: ORAL | 90 days supply | Qty: 180 | Fill #2 | Status: AC

## 2021-01-25 ENCOUNTER — Other Ambulatory Visit (HOSPITAL_COMMUNITY): Payer: Self-pay

## 2021-02-04 ENCOUNTER — Other Ambulatory Visit (HOSPITAL_COMMUNITY): Payer: Self-pay

## 2021-02-04 DIAGNOSIS — Z944 Liver transplant status: Secondary | ICD-10-CM | POA: Diagnosis not present

## 2021-02-04 DIAGNOSIS — D849 Immunodeficiency, unspecified: Secondary | ICD-10-CM | POA: Diagnosis not present

## 2021-02-04 DIAGNOSIS — N1832 Chronic kidney disease, stage 3b: Secondary | ICD-10-CM | POA: Diagnosis not present

## 2021-02-04 DIAGNOSIS — Z5181 Encounter for therapeutic drug level monitoring: Secondary | ICD-10-CM | POA: Diagnosis not present

## 2021-02-04 DIAGNOSIS — Z8639 Personal history of other endocrine, nutritional and metabolic disease: Secondary | ICD-10-CM | POA: Diagnosis not present

## 2021-02-04 LAB — COMPREHENSIVE METABOLIC PANEL
Albumin: 3.8 (ref 3.5–5.0)
Calcium: 9.4 (ref 8.7–10.7)
GFR calc non Af Amer: 46

## 2021-02-04 LAB — BASIC METABOLIC PANEL
BUN: 15 (ref 4–21)
CO2: 24 — AB (ref 13–22)
Chloride: 102 (ref 99–108)
Creatinine: 1.6 — AB (ref 0.6–1.3)
Glucose: 135
Potassium: 5 (ref 3.4–5.3)
Sodium: 137 (ref 137–147)

## 2021-02-04 LAB — HEPATIC FUNCTION PANEL
ALT: 16 (ref 10–40)
AST: 21 (ref 14–40)
Alkaline Phosphatase: 98 (ref 25–125)
Bilirubin, Total: 0.6

## 2021-02-04 LAB — CBC AND DIFFERENTIAL
HCT: 39 — AB (ref 41–53)
Hemoglobin: 12.5 — AB (ref 13.5–17.5)
Platelets: 421 — AB (ref 150–399)
WBC: 10.1

## 2021-02-04 LAB — VITAMIN D 25 HYDROXY (VIT D DEFICIENCY, FRACTURES): Vit D, 25-Hydroxy: 33

## 2021-02-16 ENCOUNTER — Other Ambulatory Visit: Payer: Self-pay | Admitting: Family Medicine

## 2021-02-16 ENCOUNTER — Other Ambulatory Visit (HOSPITAL_COMMUNITY): Payer: Self-pay

## 2021-02-17 ENCOUNTER — Encounter: Payer: Self-pay | Admitting: Hematology

## 2021-02-17 ENCOUNTER — Other Ambulatory Visit (HOSPITAL_COMMUNITY): Payer: Self-pay

## 2021-02-17 MED ORDER — AMLODIPINE BESYLATE 5 MG PO TABS
5.0000 mg | ORAL_TABLET | Freq: Every day | ORAL | 5 refills | Status: DC
  Filled 2021-02-17: qty 30, 30d supply, fill #0
  Filled 2021-03-16: qty 30, 30d supply, fill #1
  Filled 2021-04-10: qty 30, 30d supply, fill #2
  Filled 2021-05-11: qty 30, 30d supply, fill #3
  Filled 2021-06-13: qty 30, 30d supply, fill #4
  Filled 2021-07-11: qty 30, 30d supply, fill #5

## 2021-02-24 ENCOUNTER — Encounter: Payer: Self-pay | Admitting: Hematology

## 2021-02-24 ENCOUNTER — Other Ambulatory Visit (INDEPENDENT_AMBULATORY_CARE_PROVIDER_SITE_OTHER): Payer: 59

## 2021-02-24 DIAGNOSIS — D509 Iron deficiency anemia, unspecified: Secondary | ICD-10-CM

## 2021-02-24 LAB — HEMOCCULT SLIDES (X 3 CARDS)
Fecal Occult Blood: NEGATIVE
OCCULT 1: NEGATIVE
OCCULT 2: NEGATIVE
OCCULT 3: NEGATIVE
OCCULT 4: NEGATIVE
OCCULT 5: NEGATIVE

## 2021-03-04 ENCOUNTER — Other Ambulatory Visit (HOSPITAL_COMMUNITY): Payer: Self-pay

## 2021-03-04 MED ORDER — MYCOPHENOLATE MOFETIL 250 MG PO CAPS
ORAL_CAPSULE | ORAL | 3 refills | Status: DC
  Filled 2021-03-04: qty 360, 90d supply, fill #0
  Filled 2021-05-24 (×2): qty 360, 90d supply, fill #1
  Filled 2021-08-19: qty 360, 90d supply, fill #2
  Filled 2021-12-15: qty 360, 90d supply, fill #3

## 2021-03-05 ENCOUNTER — Other Ambulatory Visit (HOSPITAL_COMMUNITY): Payer: Self-pay

## 2021-03-10 DIAGNOSIS — Z944 Liver transplant status: Secondary | ICD-10-CM | POA: Diagnosis not present

## 2021-03-10 DIAGNOSIS — D849 Immunodeficiency, unspecified: Secondary | ICD-10-CM | POA: Diagnosis not present

## 2021-03-17 ENCOUNTER — Other Ambulatory Visit (HOSPITAL_COMMUNITY): Payer: Self-pay

## 2021-03-25 ENCOUNTER — Other Ambulatory Visit (HOSPITAL_COMMUNITY): Payer: Self-pay

## 2021-03-25 MED ORDER — TACROLIMUS 1 MG PO CAPS
ORAL_CAPSULE | ORAL | 3 refills | Status: DC
  Filled 2021-04-02: qty 360, 90d supply, fill #0
  Filled 2021-06-24: qty 360, 90d supply, fill #1
  Filled 2021-09-17: qty 360, 90d supply, fill #2
  Filled 2022-01-24: qty 360, 90d supply, fill #3

## 2021-04-02 ENCOUNTER — Other Ambulatory Visit (HOSPITAL_COMMUNITY): Payer: Self-pay

## 2021-04-02 DIAGNOSIS — Z944 Liver transplant status: Secondary | ICD-10-CM | POA: Diagnosis not present

## 2021-04-02 DIAGNOSIS — D849 Immunodeficiency, unspecified: Secondary | ICD-10-CM | POA: Diagnosis not present

## 2021-04-03 ENCOUNTER — Other Ambulatory Visit (HOSPITAL_COMMUNITY): Payer: Self-pay

## 2021-04-08 ENCOUNTER — Other Ambulatory Visit (HOSPITAL_COMMUNITY): Payer: Self-pay

## 2021-04-08 DIAGNOSIS — D849 Immunodeficiency, unspecified: Secondary | ICD-10-CM | POA: Diagnosis not present

## 2021-04-08 DIAGNOSIS — D225 Melanocytic nevi of trunk: Secondary | ICD-10-CM | POA: Diagnosis not present

## 2021-04-08 DIAGNOSIS — L738 Other specified follicular disorders: Secondary | ICD-10-CM | POA: Diagnosis not present

## 2021-04-08 DIAGNOSIS — Z85828 Personal history of other malignant neoplasm of skin: Secondary | ICD-10-CM | POA: Diagnosis not present

## 2021-04-08 DIAGNOSIS — L720 Epidermal cyst: Secondary | ICD-10-CM | POA: Diagnosis not present

## 2021-04-08 DIAGNOSIS — D1801 Hemangioma of skin and subcutaneous tissue: Secondary | ICD-10-CM | POA: Diagnosis not present

## 2021-04-08 DIAGNOSIS — L814 Other melanin hyperpigmentation: Secondary | ICD-10-CM | POA: Diagnosis not present

## 2021-04-08 DIAGNOSIS — D2260 Melanocytic nevi of unspecified upper limb, including shoulder: Secondary | ICD-10-CM | POA: Diagnosis not present

## 2021-04-08 DIAGNOSIS — L821 Other seborrheic keratosis: Secondary | ICD-10-CM | POA: Diagnosis not present

## 2021-04-08 DIAGNOSIS — L578 Other skin changes due to chronic exposure to nonionizing radiation: Secondary | ICD-10-CM | POA: Diagnosis not present

## 2021-04-08 DIAGNOSIS — L2084 Intrinsic (allergic) eczema: Secondary | ICD-10-CM | POA: Diagnosis not present

## 2021-04-08 DIAGNOSIS — L570 Actinic keratosis: Secondary | ICD-10-CM | POA: Diagnosis not present

## 2021-04-08 MED ORDER — TRIAMCINOLONE ACETONIDE 0.1 % EX CREA
TOPICAL_CREAM | CUTANEOUS | 2 refills | Status: DC
  Filled 2021-04-08: qty 80, 30d supply, fill #0
  Filled 2021-12-16 (×3): qty 80, 30d supply, fill #1

## 2021-04-08 MED ORDER — FLUOROURACIL 5 % EX CREA
TOPICAL_CREAM | CUTANEOUS | 2 refills | Status: DC
  Filled 2021-04-08: qty 40, 14d supply, fill #0

## 2021-04-10 ENCOUNTER — Other Ambulatory Visit: Payer: Self-pay | Admitting: Family Medicine

## 2021-04-12 ENCOUNTER — Other Ambulatory Visit (HOSPITAL_COMMUNITY): Payer: Self-pay

## 2021-04-12 ENCOUNTER — Encounter: Payer: Self-pay | Admitting: Hematology

## 2021-04-12 MED ORDER — METOPROLOL TARTRATE 50 MG PO TABS
ORAL_TABLET | Freq: Two times a day (BID) | ORAL | 1 refills | Status: DC
  Filled 2021-04-12: qty 180, 90d supply, fill #0
  Filled 2021-07-11: qty 180, 90d supply, fill #1

## 2021-04-13 ENCOUNTER — Other Ambulatory Visit (HOSPITAL_COMMUNITY): Payer: Self-pay

## 2021-04-14 DIAGNOSIS — D849 Immunodeficiency, unspecified: Secondary | ICD-10-CM | POA: Diagnosis not present

## 2021-04-14 DIAGNOSIS — Z944 Liver transplant status: Secondary | ICD-10-CM | POA: Diagnosis not present

## 2021-04-23 ENCOUNTER — Other Ambulatory Visit (HOSPITAL_COMMUNITY): Payer: Self-pay

## 2021-04-26 DIAGNOSIS — D849 Immunodeficiency, unspecified: Secondary | ICD-10-CM | POA: Diagnosis not present

## 2021-04-26 DIAGNOSIS — Z944 Liver transplant status: Secondary | ICD-10-CM | POA: Diagnosis not present

## 2021-04-28 ENCOUNTER — Encounter: Payer: Self-pay | Admitting: Cardiovascular Disease

## 2021-05-06 ENCOUNTER — Other Ambulatory Visit (HOSPITAL_COMMUNITY): Payer: Self-pay

## 2021-05-12 ENCOUNTER — Other Ambulatory Visit (HOSPITAL_COMMUNITY): Payer: Self-pay

## 2021-05-20 ENCOUNTER — Other Ambulatory Visit (HOSPITAL_COMMUNITY): Payer: Self-pay

## 2021-05-20 DIAGNOSIS — J849 Interstitial pulmonary disease, unspecified: Secondary | ICD-10-CM | POA: Diagnosis not present

## 2021-05-20 DIAGNOSIS — R0602 Shortness of breath: Secondary | ICD-10-CM | POA: Diagnosis not present

## 2021-05-20 DIAGNOSIS — M109 Gout, unspecified: Secondary | ICD-10-CM | POA: Diagnosis not present

## 2021-05-20 DIAGNOSIS — D84821 Immunodeficiency due to drugs: Secondary | ICD-10-CM | POA: Diagnosis not present

## 2021-05-20 DIAGNOSIS — Z944 Liver transplant status: Secondary | ICD-10-CM | POA: Diagnosis not present

## 2021-05-20 DIAGNOSIS — Z79621 Long term (current) use of calcineurin inhibitor: Secondary | ICD-10-CM | POA: Diagnosis not present

## 2021-05-20 DIAGNOSIS — Z8616 Personal history of COVID-19: Secondary | ICD-10-CM | POA: Diagnosis not present

## 2021-05-20 DIAGNOSIS — R011 Cardiac murmur, unspecified: Secondary | ICD-10-CM | POA: Diagnosis not present

## 2021-05-24 ENCOUNTER — Other Ambulatory Visit (HOSPITAL_COMMUNITY): Payer: Self-pay

## 2021-05-27 ENCOUNTER — Other Ambulatory Visit (HOSPITAL_COMMUNITY): Payer: Self-pay

## 2021-05-28 DIAGNOSIS — D849 Immunodeficiency, unspecified: Secondary | ICD-10-CM | POA: Diagnosis not present

## 2021-05-28 DIAGNOSIS — Z944 Liver transplant status: Secondary | ICD-10-CM | POA: Diagnosis not present

## 2021-06-14 ENCOUNTER — Other Ambulatory Visit (HOSPITAL_COMMUNITY): Payer: Self-pay

## 2021-06-24 ENCOUNTER — Other Ambulatory Visit (HOSPITAL_COMMUNITY): Payer: Self-pay

## 2021-06-25 ENCOUNTER — Other Ambulatory Visit (HOSPITAL_COMMUNITY): Payer: Self-pay

## 2021-07-06 DIAGNOSIS — I7143 Infrarenal abdominal aortic aneurysm, without rupture: Secondary | ICD-10-CM | POA: Diagnosis not present

## 2021-07-06 DIAGNOSIS — Z95828 Presence of other vascular implants and grafts: Secondary | ICD-10-CM | POA: Diagnosis not present

## 2021-07-12 ENCOUNTER — Other Ambulatory Visit (HOSPITAL_COMMUNITY): Payer: Self-pay

## 2021-07-12 ENCOUNTER — Encounter: Payer: Self-pay | Admitting: Hematology

## 2021-07-14 ENCOUNTER — Other Ambulatory Visit (HOSPITAL_COMMUNITY): Payer: Self-pay

## 2021-07-14 ENCOUNTER — Other Ambulatory Visit: Payer: Self-pay

## 2021-07-14 ENCOUNTER — Ambulatory Visit (INDEPENDENT_AMBULATORY_CARE_PROVIDER_SITE_OTHER): Payer: Medicare Other | Admitting: Family Medicine

## 2021-07-14 ENCOUNTER — Encounter: Payer: Self-pay | Admitting: Family Medicine

## 2021-07-14 VITALS — BP 124/81 | HR 71 | Temp 97.6°F | Ht 68.0 in | Wt 244.4 lb

## 2021-07-14 DIAGNOSIS — Z125 Encounter for screening for malignant neoplasm of prostate: Secondary | ICD-10-CM

## 2021-07-14 DIAGNOSIS — N1831 Chronic kidney disease, stage 3a: Secondary | ICD-10-CM

## 2021-07-14 DIAGNOSIS — E119 Type 2 diabetes mellitus without complications: Secondary | ICD-10-CM

## 2021-07-14 DIAGNOSIS — I10 Essential (primary) hypertension: Secondary | ICD-10-CM

## 2021-07-14 DIAGNOSIS — I5032 Chronic diastolic (congestive) heart failure: Secondary | ICD-10-CM | POA: Diagnosis not present

## 2021-07-14 DIAGNOSIS — E7800 Pure hypercholesterolemia, unspecified: Secondary | ICD-10-CM | POA: Diagnosis not present

## 2021-07-14 LAB — HEMOGLOBIN A1C: Hgb A1c MFr Bld: 6.4 % (ref 4.6–6.5)

## 2021-07-14 LAB — CBC WITH DIFFERENTIAL/PLATELET
Basophils Absolute: 0.1 10*3/uL (ref 0.0–0.1)
Basophils Relative: 0.8 % (ref 0.0–3.0)
Eosinophils Absolute: 0.6 10*3/uL (ref 0.0–0.7)
Eosinophils Relative: 5 % (ref 0.0–5.0)
HCT: 44.4 % (ref 39.0–52.0)
Hemoglobin: 14.2 g/dL (ref 13.0–17.0)
Lymphocytes Relative: 9.5 % — ABNORMAL LOW (ref 12.0–46.0)
Lymphs Abs: 1.1 10*3/uL (ref 0.7–4.0)
MCHC: 32 g/dL (ref 30.0–36.0)
MCV: 84.2 fl (ref 78.0–100.0)
Monocytes Absolute: 1 10*3/uL (ref 0.1–1.0)
Monocytes Relative: 8.6 % (ref 3.0–12.0)
Neutro Abs: 8.8 10*3/uL — ABNORMAL HIGH (ref 1.4–7.7)
Neutrophils Relative %: 76.1 % (ref 43.0–77.0)
Platelets: 322 10*3/uL (ref 150.0–400.0)
RBC: 5.28 Mil/uL (ref 4.22–5.81)
RDW: 18 % — ABNORMAL HIGH (ref 11.5–15.5)
WBC: 11.5 10*3/uL — ABNORMAL HIGH (ref 4.0–10.5)

## 2021-07-14 LAB — COMPREHENSIVE METABOLIC PANEL
ALT: 14 U/L (ref 0–53)
AST: 13 U/L (ref 0–37)
Albumin: 4.4 g/dL (ref 3.5–5.2)
Alkaline Phosphatase: 109 U/L (ref 39–117)
BUN: 19 mg/dL (ref 6–23)
CO2: 27 mEq/L (ref 19–32)
Calcium: 9.8 mg/dL (ref 8.4–10.5)
Chloride: 98 mEq/L (ref 96–112)
Creatinine, Ser: 1.39 mg/dL (ref 0.40–1.50)
GFR: 50.89 mL/min — ABNORMAL LOW (ref >60.00)
Glucose, Bld: 119 mg/dL — ABNORMAL HIGH (ref 70–99)
Potassium: 5.1 mEq/L (ref 3.5–5.1)
Sodium: 134 mEq/L — ABNORMAL LOW (ref 135–145)
Total Bilirubin: 0.8 mg/dL (ref 0.2–1.2)
Total Protein: 6.6 g/dL (ref 6.0–8.3)

## 2021-07-14 LAB — LIPID PANEL
Cholesterol: 175 mg/dL (ref 0–200)
HDL: 28.5 mg/dL — ABNORMAL LOW (ref >39.00)
LDL Cholesterol: 108 mg/dL — ABNORMAL HIGH (ref 0–99)
NonHDL: 146.65
Total CHOL/HDL Ratio: 6
Triglycerides: 194 mg/dL — ABNORMAL HIGH (ref 0.0–149.0)
VLDL: 38.8 mg/dL (ref 0.0–40.0)

## 2021-07-14 LAB — PSA, MEDICARE: PSA: 0.8 ng/ml (ref 0.10–4.00)

## 2021-07-14 MED ORDER — IRBESARTAN 75 MG PO TABS
75.0000 mg | ORAL_TABLET | Freq: Every day | ORAL | 1 refills | Status: DC
  Filled 2021-07-14 – 2021-07-31 (×2): qty 90, 90d supply, fill #0
  Filled 2021-10-28: qty 90, 90d supply, fill #1

## 2021-07-14 MED ORDER — METOPROLOL TARTRATE 50 MG PO TABS
ORAL_TABLET | Freq: Two times a day (BID) | ORAL | 1 refills | Status: DC
  Filled 2021-07-14: qty 180, fill #0
  Filled 2021-10-04: qty 180, 90d supply, fill #0
  Filled 2022-01-12: qty 180, 90d supply, fill #1

## 2021-07-14 MED ORDER — AMLODIPINE BESYLATE 5 MG PO TABS
5.0000 mg | ORAL_TABLET | Freq: Every day | ORAL | 1 refills | Status: DC
  Filled 2021-07-14 – 2021-08-09 (×2): qty 90, 90d supply, fill #0

## 2021-07-14 NOTE — Progress Notes (Signed)
OFFICE VISIT  07/14/2021  CC: f/u dm,htn,cri  Patient is a 71 y.o. male who presents accompanied by his wife for 6 mo f/u DM, HTN, HLD, CRI III. A/P as of last visit: "#1 hypertension.  Well-controlled on amlodipine 5 mg a day, Lopressor 50 twice daily, and the irbesartan 75 mg/day. Electrolytes and creatinine today.   2. Diabetes type 2.  A1c's have been great off of medication last several years. Feet exam normal today.  Hemoglobin A1c today.   #3 #3 chronic renal insufficiency stage III.  He avoids NSAIDs.  We are monitoring electrolytes and creatinine closely, patient on ARB and daily Lasix.   4. HLD: hx of statin intolerance.  Pt with hx of liver failure---unless trigs>400 or LDL >130 I think risks of zetia and/or meds for hypertriglyceridemia outweigh benefits.   5. Health maintenance exam: Reviewed age and gender appropriate health maintenance issues (prudent diet, regular exercise, health risks of tobacco and excessive alcohol, use of seatbelts, fire alarms in home, use of sunscreen).  Also reviewed age and gender appropriate health screening as well as vaccine recommendations. Vaccines:  All utd. Labs: cbc, tsh, cmet, flp, Hba1c. Prostate ca screening: next PSA due after 03/2021. Colon ca screening: UTD 05/2018."  INTERIM HX: William [name] feels great. Home blood pressures and heart rates consistently normal. Denies shortness of breath or dyspnea on exertion. Admits he needs to cut back on calories and increase exercise.  ROS as above, plus--> no fevers, no CP, no SOB, no wheezing, no cough, no dizziness, no HAs, no rashes, no melena/hematochezia.  No polyuria or polydipsia.  No myalgias or arthralgias.  No focal weakness, paresthesias, or tremors.  No acute vision or hearing abnormalities.  No dysuria or unusual/new urinary urgency or frequency.  No recent changes in lower legs. No n/v/d or abd pain.  No palpitations.    Past Medical History:  Diagnosis Date   AAA (abdominal aortic  aneurysm) ([place]) 03/2014   repaired 2020   Ascending aortic aneurysm (HCC)    unrepaired. Stable at 4.3 cm on imaging by [facility] thoracic surg 10/2019 and 10/2020.   Bilateral renal cysts    simple (03/2014 MRI)   CAD (coronary artery disease)    Cholelithiases 2018   asymptomatic   Chronic diastolic heart failure (HCC)    Chronic renal insufficiency, stage 3 (moderate) ([place]) 2022   baseline sCr 1.3-1.5 (GFR 55)   COVID-19 virus infection 02/2020   sotrovimab infusion   Diabetes mellitus with complication ([place]) 52/0802   A1c 6.8%. A1c 5.4% 11/2019   Gout    initially dx'd by diag arthrocentesis of elbow effusion in winter 2021, 3 episodes, all came after being started on chlorthalidone->I d/c'd chlorthal.   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  Improving + off oxygen as of 3 mo s/p liver transplant.  Doing well/resolved as of 02/2020 [facility] pulm f/u->they'll continue to follow.   History of blood transfusion 2018 X 4 dates   Chronic GI blood loss of unknown location despite full GI endoscopic eval, etc   History of liver transplant ([place]) 07/2019   [facility]   Hyperlipemia, mixed    elevated LFTs when on statins.     Hypertension    Cr bump 04/01/16 so I changed benicar-hct to benicar plain and added amlodipine 5 mg.   Increased prostate specific antigen (PSA) velocity 2021   0.11 to 4.77 from 2020 to 2021 (he got a liver transplant and was put on antirejection meds between these psa  checks).   Iron deficiency anemia 05/2016   Acute blood loss anemia: hospitalized, required transfusion x 3 U: colonoscopy and capsule study unrevealing.  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  W/u for ? hemolytic anemia to be pursued by hematologist as outpt.  Dr. [name], GI at [facility] following, too---he rec'd onc do bone marrow bx as of Jan 2019   Iron deficiency anemia due to chronic blood loss 2018/19   GI: transfusions  x >20 required; multiple endoscopies and bleeding scans unrevealing. Lysteda and octreotide + monthly iron infusions as of 04/2017. Iron infusions changed to every other month as of 09/2018 hem f/u.   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  Changes noted on all CXRs in 2018/2019.  Liver Transplant eval 03/2018->mild restriction on PFTs but no obstruction.  See further details in [facility] "pulm fibrosis" section   Microscopic hematuria    Eval unremarkable by Dr. Eulogio Ditch.   Nonmelanoma skin cancer 08/15/2018   2020 BCC nose, excised.  2022 R side of neck->mohs   Obesity    Pulmonary fibrosis (HCC)    PFTs: restrictive lung dz ([facility] transplant eval 05/2018). [facility] pulm eval felt this was likely hepatopulmonary syndrome->causing his hypoxia->low likelihood of progression (as of 10/04/18 transplant clinic f/u). Pulm rehab helping as of 2020/2021. OFF oxygen as of 10/2019 transp f/u.   Spleen enlarged     Past Surgical History:  Procedure Laterality Date   ABDOMINAL AORTIC ANEURYSM REPAIR  08/23/2018   [facility]   CARDIAC CATHETERIZATION     CARDIOVASCULAR STRESS TEST  01/17/2012; 05/04/16   Normal stress nuclear study x 2 (2018--Normal perfusion. LVEF 66% with normal wall motion. This is a low risk study).   CERVICAL DISCECTOMY  1992   COLONOSCOPY N/A 05/27/2016   No site/explanation for blood loss found.  Erythematous mucosa in cecum and ascending colon--Cecal bx: normal.  Procedure: COLONOSCOPY;  Surgeon: Carol Ada, MD;  Location: [facility] ENDOSCOPY;  Service: Endoscopy;  Laterality: N/A;   COLONOSCOPY W/ POLYPECTOMY  09/10/2014   Polypectomy x 2: recall 5 yrs (Dr. [name]).   CORONARY ANGIOPLASTY     CORONARY ARTERY BYPASS GRAFT  03/2006   CT ABDOMEN WITH CONTRAST ([place] [facility]X)  11/2018    CT CHEST WWO CONTAST ([place] HX)  07/11/2019   No significant change in interstitial lung disease with possible underlying hepatopulmonary syndrome, Increased ascites in the setting of cirrhosis, tiny new right basilar nodule   DEXA  03/07/2019   T score 0.2/NORMAL   ENTEROSCOPY N/A 07/31/2016   Procedure: ENTEROSCOPY;  Surgeon: Ladene Artist, MD;  Location[facility] ENDOSCOPY;  Service: Endoscopy;  Laterality: N/A;   ENTEROSCOPY N/A 12/02/2016   Procedure: ENTEROSCOPY;  Surgeon: Carol Ada, MD;  Location: [facility] ENDOSCOPY;  Service: Endoscopy;  Laterality: N/A;   ESOPHAGOGASTRODUODENOSCOPY N/A 05/25/2016   No site/explanation for blood loss found.  Procedure: ESOPHAGOGASTRODUODENOSCOPY (EGD);  Surgeon: Juanita Craver, MD;  Location: [facility] ENDOSCOPY;  Service: Endoscopy;  Laterality: N/A;   ESOPHAGOGASTRODUODENOSCOPY N/A 09/23/2016   Procedure: ESOPHAGOGASTRODUODENOSCOPY (EGD);  Surgeon: Carol Ada, MD;  Location: [place];  Service: Endoscopy;  Laterality: N/A;   ESOPHAGOGASTRODUODENOSCOPY (EGD) WITH PROPOFOL N/A 08/21/2018   Procedure: ESOPHAGOGASTRODUODENOSCOPY (EGD) WITH PROPOFOL;  Surgeon:  Carol Ada, MD;  Location: [facility] ENDOSCOPY;  Service: Endoscopy;  Laterality: N/A;   GIVENS CAPSULE STUDY N/A 05/27/2016   No source identified.  Repeat 10/2016--results pending.  Procedure: GIVENS CAPSULE STUDY;  Surgeon: Carol Ada, MD;  Location: [facility] ENDOSCOPY;  Service: Endoscopy;  Laterality: N/A;   GIVENS CAPSULE STUDY N/A 10/15/2016   Procedure: GIVENS CAPSULE STUDY;  Surgeon: Carol Ada, MD;  Location: [facility] ENDOSCOPY;  Service: Endoscopy;  Laterality: N/A;   GIVENS CAPSULE STUDY N/A 02/13/2017   Procedure: GIVENS CAPSULE STUDY;  Surgeon: Carol Ada, MD;  Location: [place];  Service: Endoscopy;  Laterality: N/A;   LIVER TRANSPLANT  08/02/2019   For NASH cirrhosis. [place]   NM GI BLOOD LOSS  01/27/2017   NEGATIVE   SMALL BOWEL ENTEROSCOPY  10/2016   ([facility], Dr. [name]:  Double balloon enteroscopy--to the level of proximal ileum) jejunal polyps- which were removed--not bleeding & benign.  No source of bleeding was identified.   TRANSTHORACIC ECHOCARDIOGRAM  05/25/2016; 10/2017   05/2016: EF 60%, normal wall motion, grd II DD, mild aortic stenosis, dilated aortic root and ascending aorta. 10/2017: Mild LVH; no mention made of LV function.  Moderate aorticstenosis with mean gradient 19 peak gradient 41. AVA 1.1 cm^2.  05/2018 EF 55%,4.3 cm ascend aneur, mild AS. 10/2020 EF 60-65%, normal diast fxn, mod AV sclerosis w/out sten/regurg.   VASECTOMY  1977    Outpatient Medications Prior to Visit  Medication Sig Dispense Refill   amLODipine (NORVASC) 5 MG tablet Take 1 tablet (5 mg total) by mouth daily. 30 tablet 5   aspirin EC 81 MG tablet Take 81 mg by mouth daily. Swallow whole.     furosemide (LASIX) 20 MG tablet Take 1 tablet (20 mg total) by mouth daily. 90 tablet 3   irbesartan (AVAPRO) 75 MG tablet Take 1 tablet (75 mg total) by mouth daily. 90 tablet 3   Magnesium 400 MG TABS Take 400 mg by mouth 2 (two) times daily.     metoprolol tartrate (LOPRESSOR) 50 MG tablet TAKE 1 TABLET BY MOUTH 2 TIMES DAILY 180 tablet 1   mycophenolate (CELLCEPT) 250 MG capsule Take 2 capsules (500 mg total) by mouth every 12 (twelve) hours 360 capsule 3   pantoprazole (PROTONIX) 40 MG tablet Take 1 tablet (40 mg total) by mouth once daily 90 tablet 3   tacrolimus (PROGRAF) 1 MG capsule Take 2 capsules by mouth every morning AND 1 capsule every evening 270 capsule 3   tacrolimus (PROGRAF) 1 MG capsule Take 2 capsules (2 mg total) by mouth every morning and 2 capsules (2 mg total) every evening. 360 capsule 3   triamcinolone cream (KENALOG) 0.1 % Apply 2 times a day to rash. Follow with CeraVe Cream.  Stop when smooth. 80 g 2   Everolimus 1 MG TABS Take 1 mg by mouth 2 (two) times daily 180 tablet 3   fluorouracil (EFUDEX) 5 % cream Apply to face 2 times a day for up to two weeks. 40 g 2    Facility-Administered Medications Prior to Visit  Medication Dose Route Frequency Provider Last Rate Last Admin   heparin lock flush 100 unit/mL  500 Units Intracatheter Daily PRN Truitt Merle, MD       sodium chloride flush (NS) 0.9 % injection 10 mL  10 mL Intracatheter PRN Truitt Merle, MD        Allergies  Allergen Reactions   Niacinamide Itching    Improved  upon discontinuation    Thiazide-Type Diuretics Other (See Comments)    Chlorthalidone caused gout   Quinolones Other (See Comments)    CONTRAINDICATED D/T PT HAVING AAA   Statins Other (See Comments)    Liver enzymes go up whenever on them    ROS As per HPI  PE:    07/14/2021    8:07 AM 01/13/2021    9:01 AM 11/13/2020    8:31 AM  Vitals with BMI  Height 5' 8"  5' 8"  5' 8"   Weight 244 lbs 6 oz 232 lbs 3 oz 232 lbs  BMI 37.17 74.08 14.48  Systolic 185 631 497  Diastolic 81 76 78  Pulse 71 86 72     Physical Exam  Gen: Alert, well appearing.  Patient is oriented to person, place, time, and situation. AFFECT: pleasant, lucid thought and speech. CV: RRR, soft systolic murmur, no diastolic murmur, no r/g Lungs: Soft inspiratory crackles in both bases consistent with his baseline.  Nonlabored respirations.  Aeration is good. Extremities: no Edema.  LABS:  Last CBC Lab Results  Component Value Date   WBC 10.1 02/04/2021   HGB 12.5 (A) 02/04/2021   HCT 39 (A) 02/04/2021   MCV 79.1 01/13/2021   MCH 34.0 06/27/2019   RDW 19.0 (H) 01/13/2021   PLT 421 (A) 02/04/2021   Lab Results  Component Value Date   IRON 54 01/14/2021   TIBC 319.2 01/14/2021   FERRITIN 128.4 01/14/2021   Lab Results  Component Value Date   VITAMINB12 1,240 02/63/7858   Last metabolic panel Lab Results  Component Value Date   GLUCOSE 106 (H) 01/13/2021   NA 137 02/04/2021   K 5.0 02/04/2021   CL 102 02/04/2021   CO2 24 (A) 02/04/2021   BUN 15 02/04/2021   CREATININE 1.6 (A) 02/04/2021   GFRNONAA 46 02/04/2021   CALCIUM 9.4  02/04/2021   PHOS 2.9 07/29/2016   PROT 7.0 01/13/2021   ALBUMIN 3.8 02/04/2021   LABGLOB 4.1 (H) 08/02/2016   BILITOT 0.8 01/13/2021   ALKPHOS 98 02/04/2021   AST 21 02/04/2021   ALT 16 02/04/2021   ANIONGAP 9 05/02/2019   Last lipids Lab Results  Component Value Date   CHOL 222 (H) 01/13/2021   HDL 28.90 (L) 01/13/2021   LDLCALC 100 (H) 01/25/2017   LDLDIRECT 162.0 01/13/2021   TRIG 280.0 (H) 01/13/2021   CHOLHDL 8 01/13/2021   Last hemoglobin A1c Lab Results  Component Value Date   HGBA1C 5.9 01/13/2021   Last thyroid functions Lab Results  Component Value Date   TSH 1.54 01/13/2021   Lab Results  Component Value Date   PSA1 0.2 07/28/2017   PSA 1.06 03/23/2020   PSA 4.27 (H) 11/27/2019   PSA 0.11 05/15/2018   IMPRESSION AND PLAN:  #1 type 2 diabetes, no meds. Needs to improve diet and increase exercise. Hemoglobin A1c and fasting glucose today. Repeat urine microalbumin/creatinine at next follow-up in 6 months.  #2 hypertension, well controlled on amlodipine 5 mg a day, irbesartan 75 mg a day, and Lopressor 50 mg twice a day.  #3 chronic renal insufficiency stage III. Monitor electrolytes and creatinine today.  #5  Hyperlipidemia->hx of statin intolerance.  Pt with hx of liver failure, now is status post liver transplant --->unless trigs>400 or LDL >130 I think risks of zetia and/or meds for hypertriglyceridemia outweigh benefits.  #6 chronic diastolic heart failure. No sign of volume overload. Continue Lasix 20 mg a day, Lopressor 50 mg  twice a day, irbesartan 75 mg a day. He follows up with Dr. Claiborne Billings in August this year.  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 Doing very well.  Continue tacrolimus and mycophenolate as per transplant clinic.  Next follow-up with them is June 28.  #8 preventative health: Due for PSA screening today--ordered.  An After Visit Summary was printed and given to the  patient.  FOLLOW UP: 6 mo cpe  Signed:  Crissie Sickles, MD           07/14/2021

## 2021-07-21 ENCOUNTER — Ambulatory Visit: Payer: Medicare Other

## 2021-07-21 ENCOUNTER — Ambulatory Visit (INDEPENDENT_AMBULATORY_CARE_PROVIDER_SITE_OTHER): Payer: Medicare Other

## 2021-07-21 DIAGNOSIS — Z0000 Encounter for general adult medical examination without abnormal findings: Secondary | ICD-10-CM

## 2021-07-21 NOTE — Patient Instructions (Signed)
[name] , Thank you for taking time to come for your Medicare Wellness Visit. I appreciate your ongoing commitment to your health goals. Please review the following plan we discussed and let me know if I can assist you in the future.   Screening recommendations/referrals: Colonoscopy: Done 05/15/18 repeat every 10 years  Recommended yearly ophthalmology/optometry visit for glaucoma screening and checkup Recommended yearly dental visit for hygiene and checkup  Vaccinations: Influenza vaccine: Done 10/14/20 repeat every ye ar  Pneumococcal vaccine: Up to date Tdap vaccine: Done 06/29/16 repeat every 10 years  Shingles vaccine: Completed  11/4, 10/06/20 Covid-19: Completed 1/29, 2/26, 10/24/19 & 6/13, 01/04/21  Advanced directives: Please bring a copy of your health care power of attorney and living will to the office at your convenience.  Conditions/risks identified: Lose weight and exercise   Next appointment: Follow up in one year for your annual wellness visit.   Preventive Care 34 Years and Older, Male Preventive care refers to lifestyle choices and visits with your health care provider that can promote health and wellness. What does preventive care include? A yearly physical exam. This is also called an annual well check. Dental exams once or twice a year. Routine eye exams. Ask your health care provider how often you should have your eyes checked. Personal lifestyle choices, including: Daily care of your teeth and gums. Regular physical activity. Eating a healthy diet. Avoiding tobacco and drug use. Limiting alcohol use. Practicing safe sex. Taking low doses of aspirin every day. Taking vitamin and mineral supplements as recommended by your health care provider. What happens during an annual well check? The services and screenings done by your health care provider during your annual well check will depend on your age, overall health, lifestyle risk factors, and family history of  disease. Counseling  Your health care provider may ask you questions about your: Alcohol use. Tobacco use. Drug use. Emotional well-being. Home and relationship well-being. Sexual activity. Eating habits. History of falls. Memory and ability to understand (cognition). Work and work Statistician. Screening  You may have the following tests or measurements: Height, weight, and BMI. Blood pressure. Lipid and cholesterol levels. These may be checked every 5 years, or more frequently if you are over 62 years old. Skin check. Lung cancer screening. You may have this screening every year starting at age 86 if you have a 30-pack-year history of smoking and currently smoke or have quit within the past 15 years. Fecal occult blood test (FOBT) of the stool. You may have this test every year starting at age 44. Flexible sigmoidoscopy or colonoscopy. You may have a sigmoidoscopy every 5 years or a colonoscopy every 10 years starting at age 66. Prostate cancer screening. Recommendations will vary depending on your family history and other risks. Hepatitis C blood test. Hepatitis B blood test. Sexually transmitted disease (STD) testing. Diabetes screening. This is done by checking your blood sugar (glucose) after you have not eaten for a while (fasting). You may have this done every 1-3 years. Abdominal aortic aneurysm (AAA) screening. You may need this if you are a current or former smoker. Osteoporosis. You may be screened starting at age 25 if you are at high risk. Talk with your health care provider about your test results, treatment options, and if necessary, the need for more tests. Vaccines  Your health care provider may recommend certain vaccines, such as: Influenza vaccine. This is recommended every year. Tetanus, diphtheria, and acellular pertussis (Tdap, Td) vaccine. You may need  a Td booster every 10 years. Zoster vaccine. You may need this after age 30. Pneumococcal 13-valent  conjugate (PCV13) vaccine. One dose is recommended after age 15. Pneumococcal polysaccharide (PPSV23) vaccine. One dose is recommended after age 20. Talk to your health care provider about which screenings and vaccines you need and how often you need them. This information is not intended to replace advice given to you by your health care provider. Make sure you discuss any questions you have with your health care provider. Document Released: 02/20/2015 Document Revised: 10/14/2015 Document Reviewed: 11/25/2014 Elsevier Interactive Patient Education  2017 [place] Prevention in the Home Falls can cause injuries. They can happen to people of all ages. There are many things you can do to make your home safe and to help prevent falls. What can I do on the outside of my home? Regularly fix the edges of walkways and driveways and fix any cracks. Remove anything that might make you trip as you walk through a door, such as a raised step or threshold. Trim any bushes or trees on the path to your home. Use bright outdoor lighting. Clear any walking paths of anything that might make someone trip, such as rocks or tools. Regularly check to see if handrails are loose or broken. Make sure that both sides of any steps have handrails. Any raised decks and porches should have guardrails on the edges. Have any leaves, snow, or ice cleared regularly. Use sand or salt on walking paths during winter. Clean up any spills in your garage right away. This includes oil or grease spills. What can I do in the bathroom? Use night lights. Install grab bars by the toilet and in the tub and shower. Do not use towel bars as grab bars. Use non-skid mats or decals in the tub or shower. If you need to sit down in the shower, use a plastic, non-slip stool. Keep the floor dry. Clean up any water that spills on the floor as soon as it happens. Remove soap buildup in the tub or shower regularly. Attach bath mats  securely with double-sided non-slip rug tape. Do not have throw rugs and other things on the floor that can make you trip. What can I do in the bedroom? Use night lights. Make sure that you have a light by your bed that is easy to reach. Do not use any sheets or blankets that are too big for your bed. They should not hang down onto the floor. Have a firm chair that has side arms. You can use this for support while you get dressed. Do not have throw rugs and other things on the floor that can make you trip. What can I do in the kitchen? Clean up any spills right away. Avoid walking on wet floors. Keep items that you use a lot in easy-to-reach places. If you need to reach something above you, use a strong step stool that has a grab bar. Keep electrical cords out of the way. Do not use floor polish or wax that makes floors slippery. If you must use wax, use non-skid floor wax. Do not have throw rugs and other things on the floor that can make you trip. What can I do with my stairs? Do not leave any items on the stairs. Make sure that there are handrails on both sides of the stairs and use them. Fix handrails that are broken or loose. Make sure that handrails are as long as the stairways. Check  any carpeting to make sure that it is firmly attached to the stairs. Fix any carpet that is loose or worn. Avoid having throw rugs at the top or bottom of the stairs. If you do have throw rugs, attach them to the floor with carpet tape. Make sure that you have a light switch at the top of the stairs and the bottom of the stairs. If you do not have them, ask someone to add them for you. What else can I do to help prevent falls? Wear shoes that: Do not have high heels. Have rubber bottoms. Are comfortable and fit you well. Are closed at the toe. Do not wear sandals. If you use a stepladder: Make sure that it is fully opened. Do not climb a closed stepladder. Make sure that both sides of the stepladder  are locked into place. Ask someone to hold it for you, if possible. Clearly mark and make sure that you can see: Any grab bars or handrails. First and last steps. Where the edge of each step is. Use tools that help you move around (mobility aids) if they are needed. These include: Canes. Walkers. Scooters. Crutches. Turn on the lights when you go into a dark area. Replace any light bulbs as soon as they burn out. Set up your furniture so you have a clear path. Avoid moving your furniture around. If any of your floors are uneven, fix them. If there are any pets around you, be aware of where they are. Review your medicines with your doctor. Some medicines can make you feel dizzy. This can increase your chance of falling. Ask your doctor what other things that you can do to help prevent falls. This information is not intended to replace advice given to you by your health care provider. Make sure you discuss any questions you have with your health care provider. Document Released: 11/20/2008 Document Revised: 07/02/2015 Document Reviewed: 02/28/2014 Elsevier Interactive Patient Education  2017 [employer].

## 2021-07-21 NOTE — Progress Notes (Signed)
Virtual Visit via Telephone Note  I connected with  William [name] on 07/21/21 at  1:30 PM EDT by telephone and verified that I am speaking with the correct person using two identifiers.  Medicare Annual Wellness visit completed telephonically due to Covid-19 pandemic.   Persons participating in this call: This Health Coach and this patient.   Location: Patient: Home Provider: Office   I discussed the limitations, risks, security and privacy concerns of performing an evaluation and management service by telephone and the availability of in person appointments. The patient expressed understanding and agreed to proceed.  Unable to perform video visit due to video visit attempted and failed and/or patient does not have video capability.   Some vital signs may be absent or patient reported.   Willette Brace, LPN   Subjective:   William [name] is a 71 y.o. male who presents for an Initial Medicare Annual Wellness Visit.  Review of Systems     Cardiac Risk Factors include: advanced age (>90mn, >>2women);hypertension;dyslipidemia;male gender;obesity (BMI >30kg/m2)     Objective:    There were no vitals filed for this visit. There is no height or weight on file to calculate BMI.     07/21/2021    1:44 PM 08/28/2019   10:46 AM 06/27/2019    9:55 AM 03/07/2019    9:47 AM 08/21/2018   11:15 AM 08/20/2018    2:55 PM 03/26/2018    4:45 PM  Advanced Directives  Does Patient Have a Medical Advance Directive? Yes Yes No No No No No  Type of [place]aramedicof [place] will       Does patient want to make changes to medical advance directive?  No - Patient declined       Copy of [place] Chart? No - copy requested     No - copy requested   Would patient like information on creating a medical advance directive?   No - Patient declined No - Patient declined No - Patient declined  No - Patient declined     Current Medications (verified) Outpatient Encounter Medications as of 07/21/2021  Medication Sig   amLODipine (NORVASC) 5 MG tablet Take 1 tablet (5 mg total) by mouth daily.   aspirin EC 81 MG tablet Take 81 mg by mouth daily. Swallow whole.   furosemide (LASIX) 20 MG tablet Take 1 tablet (20 mg total) by mouth daily.   irbesartan (AVAPRO) 75 MG tablet Take 1 tablet (75 mg total) by mouth daily.   Magnesium 400 MG TABS Take 400 mg by mouth 2 (two) times daily.   metoprolol tartrate (LOPRESSOR) 50 MG tablet TAKE 1 TABLET BY MOUTH 2 TIMES DAILY   mycophenolate (CELLCEPT) 250 MG capsule Take 2 capsules (500 mg total) by mouth every 12 (twelve) hours   pantoprazole (PROTONIX) 40 MG tablet Take 1 tablet (40 mg total) by mouth once daily   tacrolimus (PROGRAF) 1 MG capsule Take 2 capsules (2 mg total) by mouth every morning and 2 capsules (2 mg total) every evening.   triamcinolone cream (KENALOG) 0.1 % Apply 2 times a day to rash. Follow with CeraVe Cream.  Stop when smooth.   Facility-Administered Encounter Medications as of 07/21/2021  Medication   heparin lock flush 100 unit/mL   sodium chloride flush (NS) 0.9 % injection 10 mL    Allergies (verified) Niacinamide, Thiazide-type diuretics, Quinolones, and Statins   History: Past Medical History:  Diagnosis Date   AAA (abdominal aortic aneurysm) ([place]) 03/2014   repaired 2020   Ascending aortic aneurysm (HCC)    unrepaired. Stable at 4.3 cm on imaging by [facility] thoracic surg 10/2019 and 10/2020.   Bilateral renal cysts    simple (03/2014 MRI)   CAD (coronary artery disease)    Cholelithiases 2018   asymptomatic   Chronic diastolic heart failure (HCC)    Chronic renal insufficiency, stage 3 (moderate) ([place]) 2022   baseline sCr 1.3-1.5 (GFR 55)   COVID-19 virus infection 02/2020   sotrovimab infusion   Diabetes mellitus with complication ([place]) 42/3536   A1c 6.8%. A1c 5.4% 11/2019   Gout    initially dx'd by diag arthrocentesis of  elbow effusion in winter 2021, 3 episodes, all came after being started on chlorthalidone->I d/c'd chlorthal.   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  Improving + off oxygen as of 3 mo s/p liver transplant.  Doing well/resolved as of 02/2020 [facility] pulm f/u->they'll continue to follow.   History of blood transfusion 2018 X 4 dates   Chronic GI blood loss of unknown location despite full GI endoscopic eval, etc   History of liver transplant ([place]) 07/2019   [facility]   Hyperlipemia, mixed    elevated LFTs when on statins.     Hypertension    Cr bump 04/01/16 so I changed benicar-hct to benicar plain and added amlodipine 5 mg.   Increased prostate specific antigen (PSA) velocity 2021   0.11 to 4.77 from 2020 to 2021 (he got a liver transplant and was put on antirejection meds between these psa checks).   Iron deficiency anemia 05/2016   Acute blood loss anemia: hospitalized, required transfusion x 3 U: colonoscopy and capsule study unrevealing.  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  W/u for ? hemolytic anemia to be pursued by hematologist as outpt.  Dr. [name], GI at [facility] following, too---he rec'd onc do bone marrow bx as of Jan 2019   Iron deficiency anemia due to chronic blood loss 2018/19   GI: transfusions x >20 required; multiple endoscopies and bleeding scans unrevealing. Lysteda and octreotide + monthly iron infusions as of 04/2017. Iron infusions changed to every other month as of 09/2018 hem f/u.   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  Changes noted on all CXRs in 2018/2019.  Liver Transplant eval 03/2018->mild restriction on PFTs but no obstruction.  See further details in [facility] "pulm  fibrosis" section   Microscopic hematuria    Eval unremarkable by Dr. Eulogio Ditch.   Nonmelanoma skin cancer 08/15/2018   2020 BCC nose, excised.  2022 R side of neck->mohs   Obesity    Pulmonary fibrosis (HCC)    PFTs: restrictive lung dz ([facility] transplant eval 05/2018). [facility] pulm eval felt this was likely hepatopulmonary syndrome->causing his hypoxia->low likelihood of progression (as of 10/04/18 transplant clinic f/u). Pulm rehab helping as of 2020/2021. OFF oxygen as of 10/2019 transp f/u.   Spleen enlarged    Past Surgical History:  Procedure Laterality Date   ABDOMINAL AORTIC ANEURYSM REPAIR  08/23/2018   [facility]   CARDIAC CATHETERIZATION     CARDIOVASCULAR STRESS TEST  01/17/2012; 05/04/16   Normal stress  nuclear study x 2 (2018--Normal perfusion. LVEF 66% with normal wall motion. This is a low risk study).   CERVICAL DISCECTOMY  1992   COLONOSCOPY N/A 05/27/2016   No site/explanation for blood loss found.  Erythematous mucosa in cecum and ascending colon--Cecal bx: normal.  Procedure: COLONOSCOPY;  Surgeon: Carol Ada, MD;  Location: [facility] ENDOSCOPY;  Service: Endoscopy;  Laterality: N/A;   COLONOSCOPY W/ POLYPECTOMY  09/10/2014   Polypectomy x 2: recall 5 yrs (Dr. [name]).   CORONARY ANGIOPLASTY     CORONARY ARTERY BYPASS GRAFT  03/2006   CT ABDOMEN WITH CONTRAST ([place] HX)  11/2018   CT CHEST WWO CONTAST ([place] HX)  07/11/2019   No significant change in interstitial lung disease with possible underlying hepatopulmonary syndrome, Increased ascites in the setting of cirrhosis, tiny new right basilar nodule   DEXA  03/07/2019   T score 0.2/NORMAL   ENTEROSCOPY N/A 07/31/2016   Procedure: ENTEROSCOPY;  Surgeon: Ladene Artist, MD;  Location: [facility] ENDOSCOPY;  Service: Endoscopy;  Laterality: N/A;   ENTEROSCOPY N/A 12/02/2016   Procedure: ENTEROSCOPY;  Surgeon: Carol Ada, MD;  Location: [facility] ENDOSCOPY;  Service: Endoscopy;  Laterality: N/A;   ESOPHAGOGASTRODUODENOSCOPY N/A 05/25/2016    No site/explanation for blood loss found.  Procedure: ESOPHAGOGASTRODUODENOSCOPY (EGD);  Surgeon: Juanita Craver, MD;  Location: [facility] ENDOSCOPY;  Service: Endoscopy;  Laterality: N/A;   ESOPHAGOGASTRODUODENOSCOPY N/A 09/23/2016   Procedure: ESOPHAGOGASTRODUODENOSCOPY (EGD);  Surgeon: Carol Ada, MD;  Location: [place];  Service: Endoscopy;  Laterality: N/A;   ESOPHAGOGASTRODUODENOSCOPY (EGD) WITH PROPOFOL N/A 08/21/2018   Procedure: ESOPHAGOGASTRODUODENOSCOPY (EGD) WITH PROPOFOL;  Surgeon: Carol Ada, MD;  Location: [facility] ENDOSCOPY;  Service: Endoscopy;  Laterality: N/A;   GIVENS CAPSULE STUDY N/A 05/27/2016   No source identified.  Repeat 10/2016--results pending.  Procedure: GIVENS CAPSULE STUDY;  Surgeon: Carol Ada, MD;  Location: [facility] ENDOSCOPY;  Service: Endoscopy;  Laterality: N/A;   GIVENS CAPSULE STUDY N/A 10/15/2016   Procedure: GIVENS CAPSULE STUDY;  Surgeon: Carol Ada, MD;  Location: [facility] ENDOSCOPY;  Service: Endoscopy;  Laterality: N/A;   GIVENS CAPSULE STUDY N/A 02/13/2017   Procedure: GIVENS CAPSULE STUDY;  Surgeon: Carol Ada, MD;  Location: [place];  Service: Endoscopy;  Laterality: N/A;   LIVER TRANSPLANT  08/02/2019   For NASH cirrhosis. [place]   NM GI BLOOD LOSS  01/27/2017   NEGATIVE   SMALL BOWEL ENTEROSCOPY  10/2016   ([facility], Dr. [name]: Double balloon enteroscopy--to the level of proximal ileum) jejunal polyps- which were removed--not bleeding & benign.  No source of bleeding was identified.   TRANSTHORACIC ECHOCARDIOGRAM  05/25/2016; 10/2017   05/2016: EF 60%, normal wall motion, grd II DD, mild aortic stenosis, dilated aortic root and ascending aorta. 10/2017: Mild LVH; no mention made of LV function.  Moderate aorticstenosis with mean gradient 19 peak gradient 41. AVA 1.1 cm^2.  05/2018 EF 55%,4.3 cm ascend aneur, mild AS. 10/2020 EF 60-65%, normal diast fxn, mod AV sclerosis w/out sten/regurg.   VASECTOMY  1977   Family History  Problem  Relation Age of Onset   Hypertension Mother    Cancer - Other Mother        liver cancer   Heart disease Father    Heart attack Father    Cancer - Lung Father    Diabetes Father    Liver disease Sister    Anemia Sister    Heart disease Sister    Heart attack Sister    Heart  disease Brother        CABG 55 YEARS AGO   Hypertension Brother    Mesothelioma Brother        HALF-BROTHER   Cancer - Other Brother        CLL   Cancer - Lung Brother        Mets to brain   Cancer - Lung Sister        HALF-SISTER   Liver disease Brother    Heart disease Brother        CABG-2012   Emphysema Maternal Grandfather    Cancer - Other Paternal Grandmother        Stomach   Heart attack Paternal Grandfather    Social History   Socioeconomic History   Marital status: Married    Spouse name: Not on file   Number of children: Not on file   Years of education: Not on file   Highest education level: 10th grade  Occupational History   Not on file  Tobacco Use   Smoking status: Former    Types: Cigarettes   Smokeless tobacco: Never   Tobacco comments:    QUIT I 1983  Vaping Use   Vaping Use: Never used  Substance and Sexual Activity   Alcohol use: No   Drug use: No   Sexual activity: Yes  Other Topics Concern   Not on file  Social History Narrative   Married, 3 grown children, 3 GCs.   Educ: 10th grade.   Occupation: Retired Brewing technologist.   Tob: quit 1983, smoked about 20 pack-yr hx prior.   No alcohol.   Social Determinants of Health   Financial Resource Strain: Low Risk  (07/21/2021)   Overall Financial Resource Strain (CARDIA)    Difficulty of Paying Living Expenses: Not hard at all  Food Insecurity: No Food Insecurity (07/21/2021)   Hunger Vital Sign    Worried About Running Out of Food in the Last Year: Never true    Ran Out of Food in the Last Year: Never true  Transportation Needs: No Transportation Needs (07/21/2021)   PRAPARE - Radiographer, therapeutic (Medical): No    Lack of Transportation (Non-Medical): No  Physical Activity: Inactive (07/21/2021)   Exercise Vital Sign    Days of Exercise per Week: 0 days    Minutes of Exercise per Session: 0 min  Stress: No Stress Concern Present (07/21/2021)   [place]    Feeling of Stress : Not at all  Social Connections: Moderately Integrated (07/21/2021)   Social Connection and Isolation Panel [NHANES]    Frequency of Communication with Friends and Family: More than three times a week    Frequency of Social Gatherings with Friends and Family: More than three times a week    Attends Religious Services: 1 to 4 times per year    Active Member of [employer] or Organizations: No    Attends Archivist Meetings: Never    Marital Status: Married    Tobacco Counseling Counseling given: Not Answered Tobacco comments: QUIT I 1983   Clinical Intake:  Pre-visit preparation completed: Yes  Pain : No/denies pain     BMI - recorded: 37.17 Nutritional Status: BMI > 30  Obese Nutritional Risks: None Diabetes: No  How often do you need to have someone help you when you read instructions, pamphlets, or other written materials from your doctor or pharmacy?: 1 - Never  Diabetic?no  Interpreter Needed?: No  Information entered by :: Charlott Rakes, LPN   Activities of Daily Living    07/21/2021    1:46 PM  In your present state of health, do you have any difficulty performing the following activities:  Hearing? 1  Comment HOH  Vision? 0  Difficulty concentrating or making decisions? 0  Walking or climbing stairs? 0  Dressing or bathing? 0  Doing errands, shopping? 0  Preparing Food and eating ? N  Using the Toilet? N  In the past six months, have you accidently leaked urine? N  Do you have problems with loss of bowel control? N  Managing your Medications? N  Managing your Finances? N  Housekeeping or  managing your Housekeeping? N    Patient Care Team: Tammi Sou, MD as PCP - General (Family Medicine) Troy Sine, MD as PCP - Cardiology (Cardiology) Franchot Gallo, MD as Consulting Physician (Urology) Troy Sine, MD as Consulting Physician (Cardiology) Brunetta Genera, MD as Consulting Physician (Hematology) [name], Gaspar Skeeters, MD as Consulting Physician (Gastroenterology) Carol Ada, MD as Consulting Physician (Gastroenterology) Rege, Dwain Sarna, MD as Consulting Physician (General Surgery) Lovenia Shuck, MD as Consulting Physician (Cardiothoracic Surgery) Blazing, Venia Carbon, MD as Consulting Physician (Cardiology) Teena Irani, MD as Consulting Physician (Internal Medicine) Dorna Leitz, MD as Consulting Physician (Orthopedic Surgery) Tamsen Meek, MD as Consulting Physician (Dermatology)  Indicate any recent Medical Services you may have received from other than [facility] providers in the past year (date may be approximate).     Assessment:   This is a routine wellness examination for William [name].  Hearing/Vision screen Hearing Screening - Comments:: Pt stated HOH  Vision Screening - Comments:: Pt follows up with my eye DR  for annul eye exams   Dietary issues and exercise activities discussed: Current Exercise Habits: The patient does not participate in regular exercise at present   Goals Addressed             This Visit's Progress    Patient Stated       Lose weight and start exercise        Depression Screen    07/21/2021    1:42 PM 07/14/2021    8:08 AM 01/13/2021    9:01 AM 11/07/2019    1:25 PM 08/28/2019   10:59 AM 10/18/2018    9:49 AM 10/18/2018    9:24 AM  PHQ 2/9 Scores  PHQ - 2 Score 0 0 0 0 0 0 0  PHQ- 9 Score    0 0 0     Fall Risk    07/21/2021    1:46 PM 07/14/2021    8:08 AM 07/12/2021   10:52 AM 01/13/2021    9:01 AM 10/14/2020    8:54 AM  [place] in the past year? 0 0 0 0 0  Number falls in past yr:  0 0  0 0  Injury with Fall? 0 0  0 0  Risk for fall due to : Impaired vision      Follow up Falls prevention discussed Falls evaluation completed  Falls evaluation completed     FALL RISK PREVENTION PERTAINING TO THE HOME:  Any stairs in or around the home? Yes  If so, are there any without handrails? No  Home free of loose throw rugs in walkways, pet beds, electrical cords, etc? Yes  Adequate lighting in your home to reduce risk of falls? Yes   ASSISTIVE DEVICES UTILIZED  TO PREVENT FALLS:  Life alert? No  Use of a cane, walker or w/c? No  Grab bars in the bathroom? Yes  Shower chair or bench in shower? Yes  Elevated toilet seat or a handicapped toilet? Yes   TIMED UP AND GO:  Was the test performed? No .  Cognitive Function:        07/21/2021    1:46 PM  6CIT Screen  What Year? 0 points  What month? 0 points  What time? 0 points  Count back from 20 0 points  Months in reverse 0 points  Repeat phrase 0 points  Total Score 0 points    Immunizations Immunization History  Administered Date(s) Administered   Fluad Quad(high Dose 65+) 10/08/2018, 11/25/2019, 10/14/2020   Hepatitis B, Dialysis 06/11/2019   Hepatitis B, adult 05/16/2018, 07/26/2018, 11/29/2018, 05/21/2019   Influenza, High Dose Seasonal PF 12/02/2015, 11/03/2016, 11/16/2017   Influenza-Unspecified 11/16/2017, 11/25/2019   Moderna Covid-19 Vaccine Bivalent Booster 73yr & up 01/04/2021   Moderna SARS-COV2 Booster Vaccination 07/20/2020   Moderna Sars-Covid-2 Vaccination 03/08/2019, 04/05/2019, 10/24/2019   Pneumococcal Conjugate-13 04/01/2016   Pneumococcal Polysaccharide-23 04/05/2017   Tdap 06/29/2016   Zoster Recombinat (Shingrix) 10/06/2020, 12/11/2020    TDAP status: Up to date  Flu Vaccine status: Up to date  Pneumococcal vaccine status: Up to date  Covid-19 vaccine status: Completed vaccines  Qualifies for Shingles Vaccine? Yes   Zostavax completed Yes   Shingrix Completed?:  Yes  Screening Tests Health Maintenance  Topic Date Due   OPHTHALMOLOGY EXAM  01/07/2017   INFLUENZA VACCINE  09/07/2021   FOOT EXAM  01/13/2022   HEMOGLOBIN A1C  01/13/2022   TETANUS/TDAP  06/30/2026   COLONOSCOPY (Pts 45-447yrInsurance coverage will need to be confirmed)  05/14/2028   Pneumonia Vaccine 6557Years old  Completed   COVID-19 Vaccine  Completed   Hepatitis C Screening  Completed   Zoster Vaccines- Shingrix  Completed   HPV VACCINES  Aged Out    Health Maintenance  Health Maintenance Due  Topic Date Due   OPHTHALMOLOGY EXAM  01/07/2017    Colorectal cancer screening: Type of screening: Colonoscopy. Completed 05/15/18. Repeat every 10 years   Additional Screening:  Hepatitis C Screening:  Completed 06/29/16  Vision Screening: Recommended annual ophthalmology exams for early detection of glaucoma and other disorders of the eye. Is the patient up to date with their annual eye exam?  No  Who is the provider or what is the name of the office in which the patient attends annual eye exams? My eye Dr  If pt is not established with a provider, would they like to be referred to a provider to establish care? No .   Dental Screening: Recommended annual dental exams for proper oral hygiene  Community Resource Referral / Chronic Care Management: CRR required this visit?  No   CCM required this visit?  No      Plan:     I have personally reviewed and noted the following in the patient's chart:   Medical and social history Use of alcohol, tobacco or illicit drugs  Current medications and supplements including opioid prescriptions. Patient is not currently taking opioid prescriptions. Functional ability and status Nutritional status Physical activity Advanced directives List of other physicians Hospitalizations, surgeries, and ER visits in previous 12 months Vitals Screenings to include cognitive, depression, and falls Referrals and appointments  In addition,  I have reviewed and discussed with patient certain preventive protocols, quality metrics, and best practice  recommendations. A written personalized care plan for preventive services as well as general preventive health recommendations were provided to patient.     Willette Brace, LPN   [id number]   Nurse Notes: None

## 2021-07-31 ENCOUNTER — Other Ambulatory Visit (HOSPITAL_COMMUNITY): Payer: Self-pay

## 2021-08-04 ENCOUNTER — Other Ambulatory Visit (HOSPITAL_COMMUNITY): Payer: Self-pay

## 2021-08-05 ENCOUNTER — Other Ambulatory Visit (HOSPITAL_COMMUNITY): Payer: Self-pay

## 2021-08-05 DIAGNOSIS — Z5181 Encounter for therapeutic drug level monitoring: Secondary | ICD-10-CM | POA: Diagnosis not present

## 2021-08-05 DIAGNOSIS — Z944 Liver transplant status: Secondary | ICD-10-CM | POA: Diagnosis not present

## 2021-08-05 DIAGNOSIS — Z85828 Personal history of other malignant neoplasm of skin: Secondary | ICD-10-CM | POA: Diagnosis not present

## 2021-08-05 DIAGNOSIS — D849 Immunodeficiency, unspecified: Secondary | ICD-10-CM | POA: Diagnosis not present

## 2021-08-05 DIAGNOSIS — N1831 Chronic kidney disease, stage 3a: Secondary | ICD-10-CM | POA: Diagnosis not present

## 2021-08-05 LAB — BASIC METABOLIC PANEL
BUN: 13 (ref 4–21)
CO2: 28 — AB (ref 13–22)
Chloride: 99 (ref 99–108)
Creatinine: 1.6 — AB (ref 0.6–1.3)
Glucose: 132
Potassium: 6.1 mEq/L — AB (ref 3.5–5.1)
Sodium: 136 — AB (ref 137–147)

## 2021-08-05 LAB — HEPATIC FUNCTION PANEL
ALT: 14 U/L (ref 10–40)
AST: 13 — AB (ref 14–40)
Alkaline Phosphatase: 102 (ref 25–125)
Bilirubin, Total: 0.7

## 2021-08-05 LAB — CBC AND DIFFERENTIAL
HCT: 47 (ref 41–53)
Hemoglobin: 14.9 (ref 13.5–17.5)
Platelets: 349 10*3/uL (ref 150–400)
WBC: 10.2

## 2021-08-05 LAB — CBC: RBC: 5.49 — AB (ref 3.87–5.11)

## 2021-08-05 LAB — COMPREHENSIVE METABOLIC PANEL
Albumin: 4.1 (ref 3.5–5.0)
Calcium: 9.5 (ref 8.7–10.7)

## 2021-08-09 ENCOUNTER — Other Ambulatory Visit (HOSPITAL_COMMUNITY): Payer: Self-pay

## 2021-08-17 DIAGNOSIS — D849 Immunodeficiency, unspecified: Secondary | ICD-10-CM | POA: Diagnosis not present

## 2021-08-17 DIAGNOSIS — Z944 Liver transplant status: Secondary | ICD-10-CM | POA: Diagnosis not present

## 2021-08-19 ENCOUNTER — Other Ambulatory Visit (HOSPITAL_COMMUNITY): Payer: Self-pay

## 2021-08-23 ENCOUNTER — Other Ambulatory Visit (HOSPITAL_COMMUNITY): Payer: Self-pay

## 2021-09-01 ENCOUNTER — Ambulatory Visit: Payer: Medicare Other | Admitting: Cardiovascular Disease

## 2021-09-01 ENCOUNTER — Encounter: Payer: Self-pay | Admitting: Cardiovascular Disease

## 2021-09-01 ENCOUNTER — Other Ambulatory Visit (HOSPITAL_COMMUNITY): Payer: Self-pay

## 2021-09-01 DIAGNOSIS — Z951 Presence of aortocoronary bypass graft: Secondary | ICD-10-CM

## 2021-09-01 DIAGNOSIS — I10 Essential (primary) hypertension: Secondary | ICD-10-CM

## 2021-09-01 DIAGNOSIS — Z944 Liver transplant status: Secondary | ICD-10-CM | POA: Diagnosis not present

## 2021-09-01 DIAGNOSIS — Z8719 Personal history of other diseases of the digestive system: Secondary | ICD-10-CM

## 2021-09-01 DIAGNOSIS — N1831 Chronic kidney disease, stage 3a: Secondary | ICD-10-CM | POA: Diagnosis not present

## 2021-09-01 DIAGNOSIS — E785 Hyperlipidemia, unspecified: Secondary | ICD-10-CM | POA: Diagnosis not present

## 2021-09-01 DIAGNOSIS — R600 Localized edema: Secondary | ICD-10-CM | POA: Diagnosis not present

## 2021-09-01 DIAGNOSIS — I5032 Chronic diastolic (congestive) heart failure: Secondary | ICD-10-CM

## 2021-09-01 DIAGNOSIS — I2510 Atherosclerotic heart disease of native coronary artery without angina pectoris: Secondary | ICD-10-CM

## 2021-09-01 DIAGNOSIS — I350 Nonrheumatic aortic (valve) stenosis: Secondary | ICD-10-CM | POA: Diagnosis not present

## 2021-09-01 MED ORDER — ROSUVASTATIN CALCIUM 10 MG PO TABS
10.0000 mg | ORAL_TABLET | Freq: Every day | ORAL | 3 refills | Status: DC
  Filled 2021-09-01: qty 90, 90d supply, fill #0
  Filled 2021-11-25: qty 90, 90d supply, fill #1
  Filled 2022-02-28: qty 90, 90d supply, fill #2
  Filled 2022-05-26: qty 90, 90d supply, fill #3

## 2021-09-01 MED ORDER — AMLODIPINE BESYLATE 2.5 MG PO TABS
2.5000 mg | ORAL_TABLET | Freq: Every day | ORAL | 1 refills | Status: DC
  Filled 2021-09-01: qty 90, 90d supply, fill #0

## 2021-09-01 NOTE — Progress Notes (Signed)
Patient ID: William [name], male   DOB: 1950-09-17, 71 y.o.   MRN: [id number]    HPI: William [name] is a 71 y.o. male who presents to the office today for a 62 month cardiology evaluation.  William [name] has known CAD and underwent CABG revascularization surgery with a LIMA to the LAD, SVG to the RCA, SVG to the circumflex vessel in February 2008.  He has a history of obesity, metabolic syndrome, and mixed hyperlipidemia with preponderance of small dense LDL some particles and increased TG for which he has been on combination therapy.  In December 2013 a nuclear perfusion study was unchanged from 2 years previously and did not show significant scar or ischemia. In January 2014,  I adjusted his antihypertensive medications and started  Benicar HCT 40/25 for optimal blood pressure control with increase diuretic regimen and took him off his Avalide. He has felt improved on this therapy.  According to his wife, his blood pressure at home typically is in the 120s systolially and in the 39Q diastolically.  He has a history of moderate obesity.  He does not routinely exercise.  He also does note some dietary discretion. He has noticed some mild leg swelling intermittently. He denies recent anginal symptoms. He denies palpitations.   In 2016 he underwent a urologic evaluation for gross hematuria. A CT of his abdomen and pelvis did not reveal any evidence for renal calculi or hydronephrosis or evidence for renal calculi or dilatation. He did have small benign renal cysts. He was also noted a 3.5 cm infrarenal abdominal aortic aneurysm. There was also a suggestion of fatty infiltration of his liver.  He underwent a follow-up abdominal aortic ultrasound on 06/27/2014.  This showed his distal aorta measuring 3.0 x 3.1 cm and he had mild amount of atherosclerosis.  He underwent a  colonoscopy by William [name] and was found to have 2 benign polyps.  He has been off  Crestor and niacin since last year due to elevation  of his liver enzymes. There is family history for liver disease in that his mother died from liver disease and his brother had a liver transplant.  In July 2016, his AST was 51 and ALT was 54.  Subsequent blood work by William [name] revealed his AST increased at 87 and ALT 68.  On 01/15/2015, his AST was 85 and ALT 64.  At that time, total cholesterol was 244, triglycerides 149, HDL 30, and his LDL cholesterol on statin therapy had risen to 184.   When I saw him in December 2017 he denied chest tightness.  He was not successful with weight loss.  Labs checked by his primary M.D.showed a hemoglobin A1c was 6.6.  LFTs had slightly improved but AST was still elevated at 71 and his ALT was now normal.   He underwent a nuclear perfusion study in March 2018.  This showed normal perfusion.  Ejection fraction was 66% with normal wall motion.  The test was felt to be low risk.  He underwent an echo Doppler study in April 2012 which showed an EF of 60-65% with mild LVH.  There was grade 2 diastolic dysfunction and increased filling pressures.  There was mild aortic stenosis with a mean gradient of 23 and peak gradient of 36, mild aortic root dilatation, mild biatrial enlargement, and mild TR.  He has had recurrent admissions for anemia and GI blood loss.  He has had undergone evaluations by William [name], and William [name] and was planning  to see William [name]  at [facility] for further GI evaluation and possible procedures.  He has been intolerant to statin therapy secondary to LFT elevation.    He went to [facility] and had small bowel evaluation.  He has continued to have recurrent GI bleeds.  He has required packed red blood cell transfusions.  2 weeks ago.  His hemoglobin was 9 and hematocrit 28.9, and yesterday hemoglobin was 7.5 with hematocrit of 24.1.  He is scheduled to have another red blood cell transfusion on 01/14/2017.  He has also been undergoing IV iron therapy with his hematologist, William [name].  He denies any episodes of chest pain.  His  liver transaminases have been normal, but his bilirubin has increased in the 4 range.   He was diagnosed with cirrhosis.  When I saw him in August 2019 he was getting intravenous iron is no longer on octreotide due to insurance.  He needs to be followed by William [name] for primary care.  Denies episodes of chest tightness.  He denies presyncope or syncope.  In the past he was on Repatha but this was stopped secondary to an increased ammonia level and there was concern that this may have been contributory.  He admitted to mild fatigue and shortness of breath.  He underwent a follow-up echo Doppler study in September 2019.  He had mild LVH but on the echo there was no mention made of LV function.  There was moderate aortic valve stenosis with a mean gradient of 19 and peak gradient of 41.  Aortic valve area is 1.1 cm.  He had mild dilatation of his a sending aorta.  I last saw him in December 2019 at which time he denied any chest pain, PND or orthopnea.  He was unaware of any tachypalpitations. He was being followed by the hematology oncology team and was last seen on January 19, 2018.  Ferritin level was 123.  Iron saturation 18% which had improved from 8%.  He had mild renal insufficiency with a creatinine of 1.3.  Hemoglobin was 11.5 with hematocrit of 35.5 which was improved.   He developed pneumonia in February 2020 and bilirubin further increased to 9-10 range.  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  In August 2020 he underwent EVAR for abdominal aortic aneurysm at [facility].  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  He is being followed in their liver transplant clinic and has continued to be on prednisone and tacrolimus for immunosuppression following his transplant.  I last saw him on December 30, 2019 at which time he denied any chest pain or shortness of breath.  His blood pressure at home was typically running in the 140 to 150 range.  His  creatinine had increased to 4 following his liver transplant but subsequently improved to 1.31.    Since I saw him, he has been evaluated by Coletta Memos, NP in August 2022 and most recently in October 2022.  He underwent an echo Doppler study on October 21, 2020 which showed EF 60 to 65% with normal diastolic parameters.  There was moderate aortic valve calcification with a mean gradient of 14 and peak gradient of 24.  Estimated aortic valve area was 1.7 cm consistent with mild aortic stenosis.  There was trivial aortic insufficiency.  Presently, Mr. Luty feels well.  He is followed by Dr. Lloyd Huger for primary care.  He admits to leg swelling particularly in the evening.  He sees Dr. Dawna Part in follow-up of his liver transplant at [facility].  He underwent laboratory 3 weeks ago and most recent creatinine was 1.6.  Potassium was 5.1.  AST and ALT were normal at 13 and 14, respectively.  He was not anemic with hemoglobin 14.9 hematocrit 46.6.  White count was 10.2.  He presents for follow-up cardiology evaluation.   Past Medical History:  Diagnosis Date   AAA (abdominal aortic aneurysm) ([place]) 03/2014   repaired 2020   Ascending aortic aneurysm (HCC)    unrepaired. Stable at 4.3 cm on imaging by [facility] thoracic surg 10/2019 and 10/2020.   Bilateral renal cysts    simple (03/2014 MRI)   CAD (coronary artery disease)    Cholelithiases 2018   asymptomatic   Chronic diastolic heart failure (HCC)    Chronic renal insufficiency, stage 3 (moderate) ([place]) 2022   baseline sCr 1.3-1.5 (GFR 55)   COVID-19 virus infection 02/2020   sotrovimab infusion   Diabetes mellitus with complication ([place]) 86/5784   A1c 6.8%. A1c 5.4% 11/2019   Gout    initially dx'd by diag arthrocentesis of elbow effusion in winter 2021, 3 episodes, all came after being started on chlorthalidone->I d/c'd chlorthal.   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  Improving + off oxygen as of 3 mo s/p liver transplant.   Doing well/resolved as of 02/2020 [facility] pulm f/u->they'll continue to follow.   History of blood transfusion 2018 X 4 dates   Chronic GI blood loss of unknown location despite full GI endoscopic eval, etc   History of liver transplant ([place]) 07/2019   [facility]   Hyperlipemia, mixed    elevated LFTs when on statins.     Hypertension    Cr bump 04/01/16 so I changed benicar-hct to benicar plain and added amlodipine 5 mg.   Increased prostate specific antigen (PSA) velocity 2021   0.11 to 4.77 from 2020 to 2021 (he got a liver transplant and was put on antirejection meds between these psa checks).   Iron deficiency anemia 05/2016   Acute blood loss anemia: hospitalized, required transfusion x 3 U: colonoscopy and capsule study unrevealing.  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  W/u for ? hemolytic anemia to be pursued by hematologist as outpt.  William [name], GI at [facility] following, too---he rec'd onc do bone marrow bx as of Jan 2019   Iron deficiency anemia due to chronic blood loss 2018/19   GI: transfusions x >20 required; multiple endoscopies and bleeding scans unrevealing. Lysteda and octreotide + monthly iron infusions as of 04/2017. Iron infusions changed to every other month as of 09/2018 hem f/u.   Liver cirrhosis secondary to NASH ([place]) 05/2016   Liver transplant 07/2019   Liver failure ([place]) 2020   NASH cirrhosis   Lung field abnormal finding on examination    Bibasilar L>R mild insp crackles-->x-ray showed mild interstitial changes/fibrosis/scarring.  Changes noted on all CXRs in 2018/2019.  Liver Transplant eval 03/2018->mild restriction on PFTs but no obstruction.  See further details in PMH "pulm fibrosis" section   Microscopic hematuria    Eval unremarkable by Dr. Eulogio Ditch.   Nonmelanoma skin cancer 08/15/2018   2020 BCC nose, excised.  2022 R side of neck->mohs   Obesity    Pulmonary fibrosis (HCC)     PFTs: restrictive lung dz ([facility] Liver transplant eval 05/2018). [facility] pulm eval felt this was likely  hepatopulmonary syndrome->causing his hypoxia->low likelihood of progression (as of 10/04/18 transplant clinic f/u). Pulm rehab helping as of 2020/2021. OFF oxygen as of 10/2019 transp f/u.   Spleen enlarged     Past Surgical History:  Procedure Laterality Date   ABDOMINAL AORTIC ANEURYSM REPAIR  08/23/2018   [facility]   CARDIAC CATHETERIZATION     CARDIOVASCULAR STRESS TEST  01/17/2012; 05/04/16   Normal stress nuclear study x 2 (2018--Normal perfusion. LVEF 66% with normal wall motion. This is a low risk study).   CERVICAL DISCECTOMY  1992   COLONOSCOPY N/A 05/27/2016   No site/explanation for blood loss found.  Erythematous mucosa in cecum and ascending colon--Cecal bx: normal.  Procedure: COLONOSCOPY;  Surgeon: Carol Ada, MD;  Location: [facility] ENDOSCOPY;  Service: Endoscopy;  Laterality: N/A;   COLONOSCOPY W/ POLYPECTOMY  09/10/2014   Polypectomy x 2: recall 5 yrs (William [name]).   CORONARY ANGIOPLASTY     CORONARY ARTERY BYPASS GRAFT  03/2006   CT ABDOMEN WITH CONTRAST ([place] HX)  11/2018   CT CHEST WWO CONTAST ([place] HX)  07/11/2019   No significant change in interstitial lung disease with possible underlying hepatopulmonary syndrome, Increased ascites in the setting of cirrhosis, tiny new right basilar nodule   DEXA  03/07/2019   T score 0.2/NORMAL   ENTEROSCOPY N/A 07/31/2016   Procedure: ENTEROSCOPY;  Surgeon: Ladene Artist, MD;  Location: [facility] ENDOSCOPY;  Service: Endoscopy;  Laterality: N/A;   ENTEROSCOPY N/A 12/02/2016   Procedure: ENTEROSCOPY;  Surgeon: Carol Ada, MD;  Location: [facility] ENDOSCOPY;  Service: Endoscopy;  Laterality: N/A;   ESOPHAGOGASTRODUODENOSCOPY N/A 05/25/2016   No site/explanation for blood loss found.  Procedure: ESOPHAGOGASTRODUODENOSCOPY (EGD);  Surgeon: Juanita Craver, MD;  Location: [facility] ENDOSCOPY;  Service: Endoscopy;  Laterality: N/A;   ESOPHAGOGASTRODUODENOSCOPY N/A  09/23/2016   Procedure: ESOPHAGOGASTRODUODENOSCOPY (EGD);  Surgeon: Carol Ada, MD;  Location: [place];  Service: Endoscopy;  Laterality: N/A;   ESOPHAGOGASTRODUODENOSCOPY (EGD) WITH PROPOFOL N/A 08/21/2018   Procedure: ESOPHAGOGASTRODUODENOSCOPY (EGD) WITH PROPOFOL;  Surgeon: Carol Ada, MD;  Location: [facility] ENDOSCOPY;  Service: Endoscopy;  Laterality: N/A;   GIVENS CAPSULE STUDY N/A 05/27/2016   No source identified.  Repeat 10/2016--results pending.  Procedure: GIVENS CAPSULE STUDY;  Surgeon: Carol Ada, MD;  Location: [facility] ENDOSCOPY;  Service: Endoscopy;  Laterality: N/A;   GIVENS CAPSULE STUDY N/A 10/15/2016   Procedure: GIVENS CAPSULE STUDY;  Surgeon: Carol Ada, MD;  Location: [facility] ENDOSCOPY;  Service: Endoscopy;  Laterality: N/A;   GIVENS CAPSULE STUDY N/A 02/13/2017   Procedure: GIVENS CAPSULE STUDY;  Surgeon: Carol Ada, MD;  Location: [place];  Service: Endoscopy;  Laterality: N/A;   LIVER TRANSPLANT  08/02/2019   For NASH cirrhosis. [place]   NM GI BLOOD LOSS  01/27/2017   NEGATIVE   SMALL BOWEL ENTEROSCOPY  10/2016   ([facility], William [name]: Double balloon enteroscopy--to the level of proximal ileum) jejunal polyps- which were removed--not bleeding & benign.  No source of bleeding was identified.   TRANSTHORACIC ECHOCARDIOGRAM  05/25/2016; 10/2017   05/2016: EF 60%, normal wall motion, grd II DD, mild aortic stenosis, dilated aortic root and ascending aorta. 10/2017: Mild LVH; no mention made of LV function.  Moderate aorticstenosis with mean gradient 19 peak gradient 41. AVA 1.1 cm^2.  05/2018 EF 55%,4.3 cm ascend aneur, mild AS. 10/2020 EF 60-65%, normal diast fxn, mod AV sclerosis w/out sten/regurg.   VASECTOMY  1977    Allergies  Allergen Reactions   Niacinamide Itching  Improved upon discontinuation    Thiazide-Type Diuretics Other (See Comments)    Chlorthalidone caused gout   Quinolones Other (See Comments)    CONTRAINDICATED D/T PT HAVING AAA    Statins Other (See Comments)    Liver enzymes go up whenever on them    Current Outpatient Medications  Medication Sig Dispense Refill   aspirin EC 81 MG tablet Take 81 mg by mouth daily. Swallow whole.     furosemide (LASIX) 20 MG tablet Take 1 tablet (20 mg total) by mouth daily. 90 tablet 3   irbesartan (AVAPRO) 75 MG tablet Take 1 tablet (75 mg total) by mouth daily. 90 tablet 1   Magnesium 400 MG TABS Take 400 mg by mouth 2 (two) times daily.     metoprolol tartrate (LOPRESSOR) 50 MG tablet TAKE 1 TABLET BY MOUTH 2 TIMES DAILY 180 tablet 1   mycophenolate (CELLCEPT) 250 MG capsule Take 2 capsules (500 mg total) by mouth every 12 (twelve) hours 360 capsule 3   pantoprazole (PROTONIX) 40 MG tablet Take 1 tablet (40 mg total) by mouth once daily 90 tablet 3   rosuvastatin (CRESTOR) 10 MG tablet Take 1 tablet (10 mg total) by mouth daily. 90 tablet 3   tacrolimus (PROGRAF) 1 MG capsule Take 2 capsules (2 mg total) by mouth every morning and 2 capsules (2 mg total) every evening. 360 capsule 3   triamcinolone cream (KENALOG) 0.1 % Apply 2 times a day to rash. Follow with CeraVe Cream.  Stop when smooth. 80 g 2   amLODipine (NORVASC) 2.5 MG tablet Take 1 tablet (2.5 mg total) by mouth daily. 90 tablet 1   No current facility-administered medications for this visit.   Facility-Administered Medications Ordered in Other Visits  Medication Dose Route Frequency Provider Last Rate Last Admin   heparin lock flush 100 unit/mL  500 Units Intracatheter Daily PRN Truitt Merle, MD       sodium chloride flush (NS) 0.9 % injection 10 mL  10 mL Intracatheter PRN Truitt Merle, MD        Socially he's married has 3 children 3 grandchildren. He does not routinely exercise. In the past he had been walking 4 miles on a treadmill but has not done this in some time. There is no tobacco or alcohol use.  ROS General: Negative; No fevers, chills, or night sweats;  HEENT: Negative; No changes in vision or hearing,  sinus congestion, difficulty swallowing Pulmonary: Negative; No cough, wheezing, shortness of breath, hemoptysis Cardiovascular: Negative; No chest pain, presyncope, syncope, palpitations Positive for Increasing leg swelling GI: Positive for polyps on colonoscopy; positive for fatty liver; recent GI bleeding and symptomatic anemia, h/o cirrhosis; status post liver transplant GU: Negative; No dysuria, hematuria, or difficulty voiding Musculoskeletal: Negative; no myalgias, joint pain, or weakness Hematologic/Oncology: Negative; no easy bruising, bleeding Endocrine: Positive for metabolic syndrome/mild diabetes; no heat/cold intolerance; Neuro: Negative; no changes in balance, headaches Skin: Negative; No rashes or skin lesions Psychiatric: Negative; No behavioral problems, depression Sleep: Negative; No snoring, daytime sleepiness, hypersomnolence, bruxism, restless legs, hypnogognic hallucinations, no cataplexy Other comprehensive 14 point system review is negative.   PE BP 126/78 (BP Location: Right Arm, Patient Position: Sitting, Cuff Size: Normal)   Pulse 77   Ht 5' 8" (1.727 m)   Wt 245 lb (111.1 kg)   BMI 37.25 kg/m    Repeat blood pressure by me was 122/72  Wt Readings from Last 3 Encounters:  09/01/21 245 lb (111.1 kg)  07/14/21  244 lb 6.4 oz (110.9 kg)  01/13/21 232 lb 3.2 oz (105.3 kg)    General: Alert, oriented, no distress.  Skin: normal turgor, no rashes, warm and dry HEENT: Normocephalic, atraumatic. Pupils equal round and reactive to light; sclera anicteric; extraocular muscles intact;  Nose without nasal septal hypertrophy Mouth/Parynx benign; Mallinpatti scale 3 Neck: No JVD, no carotid bruits; normal carotid upstroke Lungs: clear to ausculatation and percussion; no wheezing or rales Chest wall: without tenderness to palpitation Heart: PMI not displaced, RRR, s1 s2 normal, 1/6 systolic murmur, no diastolic murmur, no rubs, gallops, thrills, or heaves Abdomen:  soft, nontender; no hepatosplenomehaly, BS+; abdominal aorta nontender and not dilated by palpation. Back: no CVA tenderness Pulses 2+ Musculoskeletal: full range of motion, normal strength, no joint deformities Extremities: no clubbing cyanosis or edema, Homan's sign negative  Neurologic: grossly nonfocal; Cranial nerves grossly wnl Psychologic: Normal mood and affect  September 01, 2021 ECG (independently read by me): Sinus rhythm at 77, 1st degree AV block; PR 218 msec  December 30, 2019 ECG (independently read by me): Normal sinus rhythm at 75 bpm, incomplete right bundle branch block, QS complex V1 V2.  PR interval 204 ms, QTc interval 437 ms  December 2019 ECG (independently read by me): Normal sinus rhythm at 71 bpm.  First-degree AV block.  LVH by voltage.  QRS complex V1 V2.  August 2019 ECG (independently read by me): NSR at 71; IRBBB, QS complex V1-08 January 2017 ECG (independently read by me): Normal sinus rhythm at 79 bpm.  Incomplete right bundle branch block.  LVH by voltage.  Nonspecific ST changes.  QTc interval 509 ms.  August 2018 ECG (independently read by me): Normal sinus rhythm at 74 bpm.  Incomplete right bundle branch block.  First-degree AV block with a PR interval 28 ms.  QRS complex V1 V2.  QTc interval 499 ms.  March 2018 ECG (independently read by me): Normal sinus rhythm at 61 bpm.  First degree AV block with a PR interval at 224 ms.  Incomplete right bundle branch block.  December 2017 ECG (independently read by me): Normal sinus rhythm at 63 bpm with first-degree AV block.  PR interval 222 ms.  Incomplete right bundle branch block.  June 2017 ECG (independently read by me): Normal sinus rhythm at 65 bpm with first-degree AV block with a PR interval of 214 ms.  Mild RV conduction delay.  Poor anterior R-wave progression V1 through V4.  December 2016 ECG (independently read by me): Normal sinus rhythm at 66 bpm.  First-degree AV block with a PR interval at 224  ms.  June 2016 ECG (independently read by me): Normal sinus rhythm at 66. First-degree block with PR interval 28 ms.  Incomplete right bundle branch block.  QRS complex V1-3.   October 2015 ECG (independently read by me): Sinus rhythm with first-degree AV block.  Incomplete right bundle branch block.  QTc interval 447 ms; first-degree AV block with a PR interval of 220 ms  03/19/2013 ECG (independently read by me): Normal sinus rhythm at 69 beats per minute. No ectopy. Borderline first-degree block with a PR interval of 216 ms.  Prior ECG of July 18,2014:  Sinus rhythm at 57 beats per minute. Borderline first-degree block with a period of 1214 ms. No significant ST abnormalities.  LABS:    Latest Ref Rng & Units 08/05/2021   12:00 AM 07/14/2021    8:30 AM 02/04/2021   12:00 AM  BMP  Glucose 70 -  99 mg/dL  119    BUN 4 - _0 Creatinine 0.6 - 1.3 1.6     1.39  1.6      Sodium 137 - 147 136     134  137      Potassium 3.5 - 5.1 mEq/L 6.1     5.1  5.0      Chloride 99 - 108 99     98  102      CO2 13 - _1 Calcium 8.7 - 10.7 9.5     9.8  9.4         This result is from an external source.      Latest Ref Rng & Units 08/05/2021   12:00 AM 07/14/2021    8:30 AM 02/04/2021   12:00 AM  Hepatic Function  Total Protein 6.0 - 8.3 g/dL  6.6    Albumin 3.5 - 5.0 4.1     4.4  3.8      AST 14 - 40 _2 ALT 10 - 40 U/L _3 Alk Phosphatase 25 - 125 102     109  98      Total Bilirubin 0.2 - 1.2 mg/dL  0.8       This result is from an external source.      Latest Ref Rng & Units 08/05/2021   12:00 AM 07/14/2021    8:30 AM 02/04/2021   12:00 AM  CBC  WBC  10.2     11.5  10.1      Hemoglobin 13.5 - 17.5 14.9     14.2  12.5      Hematocrit 41 - 53 47     44.4  39      Platelets 150 - 400 K/uL 349     322.0  421         This result is from an external source.   Lab Results  Component Value Date   MCV 84.2 07/14/2021   MCV  79.1 01/13/2021   MCV 98.5 06/27/2019   Lab Results  Component Value Date   TSH 1.54 01/13/2021   Lab Results  Component Value Date   HGBA1C 6.4 07/14/2021   Lipid Panel     Component Value Date/Time   CHOL 175 07/14/2021 0830   CHOL 162 01/25/2017 0000   CHOL 135 11/29/2013 0940   TRIG 194.0 (H) 07/14/2021 0830   TRIG 177 (H) 11/29/2013 0940   HDL 28.50 (L) 07/14/2021 0830   HDL 47 01/25/2017 0000   HDL 34 (L) 11/29/2013 0940   CHOLHDL 6 07/14/2021 0830   VLDL 38.8 07/14/2021 0830   LDLCALC 108 (H) 07/14/2021 0830   LDLCALC 100 (H) 01/25/2017 0000   LDLCALC 66 11/29/2013 0940   LDLDIRECT 162.0 01/13/2021 0933     RADIOLOGY: No results found.   I extensively reviewed the medical records over the past 2 years from [place]:  1. Coronary artery disease involving native coronary artery of native heart without angina pectoris   2. Hx of CABG   3. Stage 3a chronic kidney disease ([place])   4. Essential hypertension   5. Mild aortic valve stenosis   6. Bilateral lower extremity  edema   7. History of cirrhosis   8. Liver transplant recipient [facility])   9. Hyperlipidemia with target LDL less than 70     ASSESSMENT AND PLAN: Mr. Hedman is a 72 year-old gentleman with a history of CAD who underwent CABG revascularization surgery x4 in February 2008 . A nuclear perfusion study in December 2013 showed fairly normal perfusion without scar or ischemia. A five-year follow-up study in March 2018 continued to be low risk demonstrating normal perfusion and function.. He has significant mixed hyperlipidemia and in the past has been documented to have a preponderance of small dense LDL particles and significantly increased triglycerides. Remotely he initially tolerated combination therapy with Niaspan 2000 mg and Crestor 20 mg without side effects.  However, he subsequently developed LFT elevation and statin and niacin were discontinued and LFTs normalized.  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  He subsequently developed cirrhosis and underwent ultimately successful liver transplantation on the fourth attempt on August 02, 2019.  He was on tacrolimus and prednisone for immunosuppression and is now on tacrolimus and CellCept.  His blood pressure today is excellent on his current regimen of amlodipine 5 mg, furosemide 20 mg, irbesartan 75 mg and metoprolol tartrate 50 mg twice a day.  His echo Doppler study in September 2022 showed normal LV function with moderate aortic valve calcification and mild stenosis with a mean gradient of 14 and peak gradient of 24 with trivial aortic regurgitation.  He underwent recent laboratory at [facility] and continues to see Dr. Dawna Part.  He was told by her that he may need to start statin therapy with his most recent LDL at 108.  I will add rosuvastatin 10 mg daily.  In 2 to 3 months I will recheck comprehensive metabolic panel and fasting lipid studies.  With his bilateral lower extremity edema and normal blood pressure I will decrease amlodipine to 2.5 mg.  He can take an extra 20 mg of Lasix on an as-needed basis depending upon swelling.  I will see him in 6 months for reevaluation.   Troy Sine, MD, [facility]  09/04/2021 5:09 PM

## 2021-09-01 NOTE — Patient Instructions (Addendum)
Medication Instructions:  Decrease Amlodipine 2.5 mg daily  Start Rosuvastatin 10 mg daily   If the swelling does not improve after the decrease of Amlodipine- you can take an extra 20 mg of Lasix as needed.   *If you need a refill on your cardiac medications before your next appointment, please call your pharmacy*   Lab Work: CMET, LIPID (2-3 months, come back fasting- nothing to eat or drink, no lab appointment needed)   If you have labs (blood work) drawn today and your tests are completely normal, you will receive your results only by: [place] (if you have MyChart) OR A paper copy in the mail If you have any lab test that is abnormal or we need to change your treatment, we will call you to review the results.   Follow-Up: At [facility], you and your health needs are our priority.  As part of our continuing mission to provide you with exceptional heart care, we have created designated Provider Care Teams.  These Care Teams include your primary Cardiologist (physician) and Advanced Practice Providers (APPs -  Physician Assistants and Nurse Practitioners) who all work together to provide you with the care you need, when you need it.  We recommend signing up for the patient portal called "MyChart".  Sign up information is provided on this After Visit Summary.  MyChart is used to connect with patients for Virtual Visits (Telemedicine).  Patients are able to view lab/test results, encounter notes, upcoming appointments, etc.  Non-urgent messages can be sent to your provider as well.   To learn more about what you can do with MyChart, go to [email or web address].    Your next appointment:   6 month(s)  The format for your next appointment:   In Person  Provider:   Shelva Majestic, MD

## 2021-09-02 DIAGNOSIS — D849 Immunodeficiency, unspecified: Secondary | ICD-10-CM | POA: Diagnosis not present

## 2021-09-02 DIAGNOSIS — Z944 Liver transplant status: Secondary | ICD-10-CM | POA: Diagnosis not present

## 2021-09-04 ENCOUNTER — Encounter: Payer: Self-pay | Admitting: Cardiovascular Disease

## 2021-09-06 DIAGNOSIS — Z86010 Personal history of colonic polyps: Secondary | ICD-10-CM | POA: Diagnosis not present

## 2021-09-06 DIAGNOSIS — K7460 Unspecified cirrhosis of liver: Secondary | ICD-10-CM | POA: Diagnosis not present

## 2021-09-14 ENCOUNTER — Other Ambulatory Visit (HOSPITAL_COMMUNITY): Payer: Self-pay

## 2021-09-14 MED ORDER — NA SULFATE-K SULFATE-MG SULF 17.5-3.13-1.6 GM/177ML PO SOLN
ORAL | 0 refills | Status: DC
  Filled 2021-09-14: qty 354, 1d supply, fill #0

## 2021-09-15 ENCOUNTER — Other Ambulatory Visit (HOSPITAL_COMMUNITY): Payer: Self-pay

## 2021-09-17 ENCOUNTER — Other Ambulatory Visit (HOSPITAL_COMMUNITY): Payer: Self-pay

## 2021-10-04 ENCOUNTER — Other Ambulatory Visit (HOSPITAL_COMMUNITY): Payer: Self-pay

## 2021-10-04 MED ORDER — PANTOPRAZOLE SODIUM 40 MG PO TBEC
DELAYED_RELEASE_TABLET | ORAL | 3 refills | Status: DC
  Filled 2021-10-04: qty 90, 90d supply, fill #0
  Filled 2021-12-30: qty 90, 90d supply, fill #1
  Filled 2022-04-05: qty 90, 90d supply, fill #2
  Filled 2022-07-02: qty 90, 90d supply, fill #3

## 2021-10-05 ENCOUNTER — Ambulatory Visit: Payer: Medicare Other | Attending: Physician Assistant | Admitting: Physician Assistant

## 2021-10-05 ENCOUNTER — Encounter: Payer: Self-pay | Admitting: Hematology

## 2021-10-05 ENCOUNTER — Telehealth: Payer: Self-pay | Admitting: *Deleted

## 2021-10-05 DIAGNOSIS — Z01810 Encounter for preprocedural cardiovascular examination: Secondary | ICD-10-CM

## 2021-10-05 NOTE — Telephone Encounter (Signed)
I s/w the pt and he is agreeable to add on tele visit for today. Our office was faxed over a request today for procedure 10/07/21. Pt has been added on to 10/05/21 @ 3:20. Consent is done. Did not review meds with the pt.

## 2021-10-05 NOTE — Telephone Encounter (Signed)
I s/w the pt and he is agreeable to add on tele visit for today. Our office was faxed over a request today for procedure 10/07/21. Pt has been added on to 10/05/21 @ 3:20. Consent is done. Did not review meds with the pt.      Patient Consent for Virtual Visit        William [name] has provided verbal consent on 10/05/2021 for a virtual visit (video or telephone).   CONSENT FOR VIRTUAL VISIT FOR:  William [name]  By participating in this virtual visit I agree to the following:  I hereby voluntarily request, consent and authorize [facility] and its employed or contracted physicians, physician assistants, nurse practitioners or other licensed health care professionals (the Practitioner), to provide me with telemedicine health care services (the "Services") as deemed necessary by the treating Practitioner. I acknowledge and consent to receive the Services by the Practitioner via telemedicine. I understand that the telemedicine visit will involve communicating with the Practitioner through live audiovisual communication technology and the disclosure of certain medical information by electronic transmission. I acknowledge that I have been given the opportunity to request an in-person assessment or other available alternative prior to the telemedicine visit and am voluntarily participating in the telemedicine visit.  I understand that I have the right to withhold or withdraw my consent to the use of telemedicine in the course of my care at any time, without affecting my right to future care or treatment, and that the Practitioner or I may terminate the telemedicine visit at any time. I understand that I have the right to inspect all information obtained and/or recorded in the course of the telemedicine visit and may receive copies of available information for a reasonable fee.  I understand that some of the potential risks of receiving the Services via telemedicine include:  Delay or  interruption in medical evaluation due to technological equipment failure or disruption; Information transmitted may not be sufficient (e.g. poor resolution of images) to allow for appropriate medical decision making by the Practitioner; and/or  In rare instances, security protocols could fail, causing a breach of personal health information.  Furthermore, I acknowledge that it is my responsibility to provide information about my medical history, conditions and care that is complete and accurate to the best of my ability. I acknowledge that Practitioner's advice, recommendations, and/or decision may be based on factors not within their control, such as incomplete or inaccurate data provided by me or distortions of diagnostic images or specimens that may result from electronic transmissions. I understand that the practice of medicine is not an exact science and that Practitioner makes no warranties or guarantees regarding treatment outcomes. I acknowledge that a copy of this consent can be made available to me via my patient portal ([place]), or I can request a printed copy by calling the office of [place].    I understand that my insurance will be billed for this visit.   I have read or had this consent read to me. I understand the contents of this consent, which adequately explains the benefits and risks of the Services being provided via telemedicine.  I have been provided ample opportunity to ask questions regarding this consent and the Services and have had my questions answered to my satisfaction. I give my informed consent for the services to be provided through the use of telemedicine in my medical care

## 2021-10-05 NOTE — Telephone Encounter (Signed)
   Name: William [name]  DOB: Sep 22, 1950  MRN: [id number]  Primary Cardiologist: Shelva Majestic, MD   Preoperative team, please contact this patient and set up a phone call appointment for further preoperative risk assessment. Please obtain consent and complete medication review. Thank you for your help.  I confirm that guidance regarding antiplatelet and oral anticoagulation therapy has been completed and, if necessary, noted below. - No meds requested to be held.  Richardson Dopp, PA-C 10/05/2021, 12:57 PM [place]

## 2021-10-05 NOTE — Progress Notes (Signed)
Virtual Visit via Telephone Note   Because of William William [name]'s co-morbid illnesses, William William [name] is at least at moderate risk for complications without adequate follow up.  This format is felt to be most appropriate for this William [name] at this time.  William William [name] did not have access to video technology/had technical difficulties with video requiring transitioning to audio format only (telephone).  All issues noted in this document were discussed and addressed.  No physical exam could be performed with this format.  Please refer to William William [name]'s chart for his consent to telehealth for [name].  Evaluation Performed:  Preoperative cardiovascular risk assessment _____________   Date:  10/05/2021   William [name] ID:  William William [name], DOB 01-10-1951, MRN [id number] William [name] Location:  Home Provider location:   Office  Primary Care Provider:  Tammi Sou, MD Primary Cardiologist:  William Majestic, MD  Chief Complaint / William [name] Profile   71 y.o. y/o male with a h/o  Coronary artery disease  S/p CABG in 2008 Myoview 05/04/2016: Normal perfusion, EF 66; low risk (HFpEF) heart failure with preserved ejection fraction  Aortic stenosis - mild Echo 10/21/2020: EF 60-65, no RWMA, normal RVSF, trivial MR, trivial AI, mean AV gradient 14, V-max 245 cm/s, DI 8.46 Obesity  Metabolic syndrome Chronic kidney disease Hypertension Hyperlipidemia  Anemia GI blood loss Cirrhosis  S/p Liver Tx at [facility] 2021 Abdominal aortic aneurysm  S/p EVAR at [facility] 2020 Ascending aortic aneurysm CT 04/2018: 3.9 cm MRA 10/2019 ([facility]): Aortic root 4 cm, ascending aorta 3.9 cm MRA 10/27/2020 ([facility]): Root 4 cm, ascending aorta 3.8 cm Pulmonary fibrosis  who is pending  Procedure:   COLONOSCOPY Date of Surgery:  Clearance 10/07/21                              Surgeon:  DR. MANN Surgeon's Group or Practice Name:  [facility] Type of Clearance Requested:   - Medical ; NO MEDICATIONS LISTED AS NEEDING TO BE  HELD Type of Anesthesia:   PROPOFOL     William William [name] presents today for telephonic preoperative cardiovascular risk assessment.  Past Medical History    Past Medical History:  Diagnosis Date   AAA (abdominal aortic aneurysm) ([place]) 03/2014   repaired 2020   Ascending aortic aneurysm (HCC)    unrepaired. Stable at 4.3 cm on imaging by [facility] thoracic surg 10/2019 and 10/2020.   Bilateral renal cysts    simple (03/2014 MRI)   CAD (coronary artery disease)    Cholelithiases 2018   asymptomatic   Chronic diastolic heart failure (HCC)    Chronic renal insufficiency, stage 3 (moderate) ([place]) 2022   baseline sCr 1.3-1.5 (GFR 55)   COVID-19 virus infection 02/2020   sotrovimab infusion   Diabetes mellitus with complication ([place]) 96/2952   A1c 6.8%. A1c 5.4% 11/2019   Gout    initially dx'd by diag arthrocentesis of elbow effusion in winter 2021, 3 episodes, all came after being started on chlorthalidone->I d/c'd chlorthal.   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  Improving + off oxygen as of 3 mo s/p liver transplant.  Doing well/resolved as of 02/2020 [facility] pulm f/u->they'll continue to follow.   History of blood transfusion 2018 X 4 dates   Chronic GI blood loss of unknown location despite full GI endoscopic eval, etc   History of liver transplant ([place]) 07/2019   [facility]   Hyperlipemia, mixed    elevated LFTs  when on statins.     Hypertension    Cr bump 04/01/16 so I changed benicar-hct to benicar plain and added amlodipine 5 mg.   Increased prostate specific antigen (PSA) velocity 2021   0.11 to 4.77 from 2020 to 2021 (William William [name] got a liver transplant and was put on antirejection meds between these psa checks).   Iron deficiency anemia 05/2016   Acute blood loss anemia: hospitalized, required transfusion x 3 U: colonoscopy and capsule study unrevealing.  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  W/u for ?  hemolytic anemia to be pursued by hematologist as outpt.  Dr. [name], GI at [facility] following, too---William William [name] rec'd onc do bone marrow bx as of Jan 2019   Iron deficiency anemia due to chronic blood loss 2018/19   GI: transfusions x >20 required; multiple endoscopies and bleeding scans unrevealing. Lysteda and octreotide + monthly iron infusions as of 04/2017. Iron infusions changed to every other month as of 09/2018 hem f/u.   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  Changes noted on all CXRs in 2018/2019.  Liver Transplant eval 03/2018->mild restriction on PFTs but no obstruction.  See further details in PMH "pulm fibrosis" section   Microscopic hematuria    Eval unremarkable by Dr. Eulogio Ditch.   Nonmelanoma skin cancer 08/15/2018   2020 BCC nose, excised.  2022 R side of neck->mohs   Obesity    Pulmonary fibrosis (HCC)    PFTs: restrictive lung dz ([facility] Liver transplant eval 05/2018). [facility] pulm eval felt this was likely hepatopulmonary syndrome->causing his hypoxia->low likelihood of progression (as of 10/04/18 transplant clinic f/u). Pulm rehab helping as of 2020/2021. OFF oxygen as of 10/2019 transp f/u.   Spleen enlarged    Past Surgical History:  Procedure Laterality Date   ABDOMINAL AORTIC ANEURYSM REPAIR  08/23/2018   [facility]   CARDIAC CATHETERIZATION     CARDIOVASCULAR STRESS TEST  01/17/2012; 05/04/16   Normal stress nuclear study x 2 (2018--Normal perfusion. LVEF 66% with normal wall motion. This is a low risk study).   CERVICAL DISCECTOMY  1992   COLONOSCOPY N/A 05/27/2016   No site/explanation for blood loss found.  Erythematous mucosa in cecum and ascending colon--Cecal bx: normal.  Procedure: COLONOSCOPY;  Surgeon: Carol Ada, MD;  Location: [facility] ENDOSCOPY;  Service: Endoscopy;   Laterality: N/A;   COLONOSCOPY W/ POLYPECTOMY  09/10/2014   Polypectomy x 2: recall 5 yrs (Dr. [name]).   CORONARY ANGIOPLASTY     CORONARY ARTERY BYPASS GRAFT  03/2006   CT ABDOMEN WITH CONTRAST ([place] [facility]X)  11/2018   CT CHEST WWO CONTAST ([place] HX)  07/11/2019   No significant change in interstitial lung disease with possible underlying hepatopulmonary syndrome, Increased ascites in William setting of cirrhosis, tiny new right basilar nodule   DEXA  03/07/2019   T score 0.2/NORMAL   ENTEROSCOPY N/A 07/31/2016   Procedure: ENTEROSCOPY;  Surgeon: Ladene Artist, MD;  Location[facility] ENDOSCOPY;  Service: Endoscopy;  Laterality: N/A;   ENTEROSCOPY N/A 12/02/2016   Procedure: ENTEROSCOPY;  Surgeon: Carol Ada, MD;  Location: [facility] ENDOSCOPY;  Service: Endoscopy;  Laterality: N/A;   ESOPHAGOGASTRODUODENOSCOPY N/A 05/25/2016   No site/explanation for blood loss found.  Procedure: ESOPHAGOGASTRODUODENOSCOPY (EGD);  Surgeon: Juanita Craver, MD;  Location: [facility] ENDOSCOPY;  Service: Endoscopy;  Laterality: N/A;   ESOPHAGOGASTRODUODENOSCOPY N/A 09/23/2016   Procedure: ESOPHAGOGASTRODUODENOSCOPY (EGD);  Surgeon: Carol Ada, MD;  Location: [place];  Service: Endoscopy;  Laterality: N/A;   ESOPHAGOGASTRODUODENOSCOPY (EGD) WITH PROPOFOL N/A 08/21/2018   Procedure: ESOPHAGOGASTRODUODENOSCOPY (EGD) WITH PROPOFOL;  Surgeon: Carol Ada, MD;  Location: [facility] ENDOSCOPY;  Service: Endoscopy;  Laterality: N/A;   GIVENS CAPSULE STUDY N/A 05/27/2016   No source identified.  Repeat 10/2016--results pending.  Procedure: GIVENS CAPSULE STUDY;  Surgeon: Carol Ada, MD;  Location: [facility] ENDOSCOPY;  Service: Endoscopy;  Laterality: N/A;   GIVENS CAPSULE STUDY N/A 10/15/2016   Procedure: GIVENS CAPSULE STUDY;  Surgeon: Carol Ada, MD;  Location: [facility] ENDOSCOPY;  Service: Endoscopy;  Laterality: N/A;   GIVENS CAPSULE STUDY N/A 02/13/2017   Procedure: GIVENS CAPSULE STUDY;  Surgeon: Carol Ada, MD;  Location: [place];   Service: Endoscopy;  Laterality: N/A;   LIVER TRANSPLANT  08/02/2019   For NASH cirrhosis. [place]   NM GI BLOOD LOSS  01/27/2017   NEGATIVE   SMALL BOWEL ENTEROSCOPY  10/2016   ([facility], Dr. [name]: Double balloon enteroscopy--to William level of proximal ileum) jejunal polyps- which were removed--not bleeding & benign.  No source of bleeding was identified.   TRANSTHORACIC ECHOCARDIOGRAM  05/25/2016; 10/2017   05/2016: EF 60%, normal wall motion, grd II DD, mild aortic stenosis, dilated aortic root and ascending aorta. 10/2017: Mild LVH; no mention made of LV function.  Moderate aorticstenosis with mean gradient 19 peak gradient 41. AVA 1.1 cm^2.  05/2018 EF 55%,4.3 cm ascend aneur, mild AS. 10/2020 EF 60-65%, normal diast fxn, mod AV sclerosis w/out sten/regurg.   VASECTOMY  1977    Allergies  Allergies  Allergen Reactions   Niacinamide Itching    Improved upon discontinuation    Thiazide-Type Diuretics Other (See Comments)    Chlorthalidone caused gout   Quinolones Other (See Comments)    CONTRAINDICATED D/T PT HAVING AAA   Statins Other (See Comments)    Liver enzymes go up whenever on them    History of Present Illness    William William [name] is a 71 y.o. male who presents via audio/video conferencing for a telehealth visit today.  Pt was last seen in cardiology clinic by Dr Claiborne Billings on 09/01/21.  AThe William [name] is now pending procedure as outlined above. Since his last visit, William William [name] has done well without chest pain, significant shortness of breath, syncope, orthopnea, leg edema.   Home Medications    Prior to Admission medications   Medication Sig Start Date End Date Taking? Authorizing Provider  amLODipine (NORVASC) 2.5 MG tablet Take 1 tablet (2.5 mg total) by mouth daily. 09/01/21   Troy Sine, MD  aspirin EC 81 MG tablet Take 81 mg by mouth daily. Swallow whole.    [provider]  furosemide (LASIX) 20 MG tablet Take 1 tablet (20 mg total) by mouth daily. 01/13/21    McGowen, Adrian Blackwater, MD  irbesartan (AVAPRO) 75 MG tablet Take 1 tablet (75 mg total) by mouth daily. 07/14/21   McGowen, Adrian Blackwater, MD  Magnesium 400 MG TABS Take 400 mg by mouth 2 (two) times daily.    [provider]  metoprolol tartrate (LOPRESSOR) 50 MG tablet TAKE 1 TABLET BY MOUTH 2 TIMES DAILY 07/14/21 07/14/22  McGowen, Adrian Blackwater, MD  mycophenolate (CELLCEPT) 250 MG capsule Take 2 capsules (500 mg total) by mouth every 12 (  twelve) hours 03/04/21     Na Sulfate-K Sulfate-Mg Sulf (SUPREP BOWEL PREP KIT) 17.5-3.13-1.6 GM/177ML SOLN Use as directed. 09/14/21     pantoprazole (PROTONIX) 40 MG tablet Take 1 tablet (40 mg total) by mouth once daily 10/04/21     rosuvastatin (CRESTOR) 10 MG tablet Take 1 tablet (10 mg total) by mouth daily. 09/01/21 08/27/22  Troy Sine, MD  tacrolimus (PROGRAF) 1 MG capsule Take 2 capsules (2 mg total) by mouth every morning and 2 capsules (2 mg total) every evening. 03/25/21     triamcinolone cream (KENALOG) 0.1 % Apply 2 times a day to rash. Follow with CeraVe Cream.  Stop when smooth. 04/08/21       Physical Exam    Vital Signs:  William William [name] does not have vital signs available for review today.  Given telephonic nature of communication, physical exam is limited. AAOx3. NAD. Normal affect.  Speech and respirations are unlabored.  Accessory Clinical Findings    None  Assessment & Plan    1.  Preoperative Cardiovascular Risk Assessment:    William William [name] perioperative risk of a major cardiac event is 6.6% according to William Revised Cardiac Risk Index (RCRI).  Therefore, William William [name] is at high risk for perioperative complications.   His functional capacity is good at 4.31 METs according to William [facility] Activity Status Index (DASI). Recommendations: According to ACC/AHA guidelines, no further cardiovascular testing needed.  William William [name] may proceed to surgery at acceptable risk.   Antiplatelet and/or Anticoagulation Recommendations: Ideally, we would recommend  continuing aspirin without interruption unless William bleeding risk is too great.  However, William William [name] has already been instructed to hold aspirin for William past 5 days.  Therefore, William William [name] can continue to hold aspirin for a total of 7 days prior to his surgery.  Please resume Aspirin post operatively when it is felt to be safe from a bleeding standpoint.    A copy of this note will be routed to requesting surgeon.  Time:   Today, I have spent 9 minutes with William William [name] with telehealth technology discussing medical history, symptoms, and management plan.     Richardson Dopp, PA-C 10/05/2021, 3:28 PM

## 2021-10-05 NOTE — Telephone Encounter (Signed)
   Pre-operative Risk Assessment    Patient Name: William [name]  DOB: 09-08-1950 MRN: [id number]      Request for Surgical Clearance    Procedure:   COLONOSCOPY  Date of Surgery:  Clearance 10/07/21                                 Surgeon:  DR. MANN Surgeon's Group or Practice Name:  [id number] Phone number:  [phone] Fax number:  [phone]   Type of Clearance Requested:   - Medical ; NO MEDICATIONS LISTED AS NEEDING TO BE HELD   Type of Anesthesia:   PROPOFOL    Additional requests/questions:    Jiles Prows   10/05/2021, 12:28 PM

## 2021-10-06 DIAGNOSIS — Z944 Liver transplant status: Secondary | ICD-10-CM | POA: Diagnosis not present

## 2021-10-06 DIAGNOSIS — D849 Immunodeficiency, unspecified: Secondary | ICD-10-CM | POA: Diagnosis not present

## 2021-10-06 DIAGNOSIS — Z940 Kidney transplant status: Secondary | ICD-10-CM | POA: Diagnosis not present

## 2021-10-14 DIAGNOSIS — L821 Other seborrheic keratosis: Secondary | ICD-10-CM | POA: Diagnosis not present

## 2021-10-14 DIAGNOSIS — D849 Immunodeficiency, unspecified: Secondary | ICD-10-CM | POA: Diagnosis not present

## 2021-10-14 DIAGNOSIS — L570 Actinic keratosis: Secondary | ICD-10-CM | POA: Diagnosis not present

## 2021-10-14 DIAGNOSIS — D225 Melanocytic nevi of trunk: Secondary | ICD-10-CM | POA: Diagnosis not present

## 2021-10-14 DIAGNOSIS — L814 Other melanin hyperpigmentation: Secondary | ICD-10-CM | POA: Diagnosis not present

## 2021-10-14 DIAGNOSIS — L720 Epidermal cyst: Secondary | ICD-10-CM | POA: Diagnosis not present

## 2021-10-14 DIAGNOSIS — D1801 Hemangioma of skin and subcutaneous tissue: Secondary | ICD-10-CM | POA: Diagnosis not present

## 2021-10-14 DIAGNOSIS — D2260 Melanocytic nevi of unspecified upper limb, including shoulder: Secondary | ICD-10-CM | POA: Diagnosis not present

## 2021-10-14 DIAGNOSIS — L578 Other skin changes due to chronic exposure to nonionizing radiation: Secondary | ICD-10-CM | POA: Diagnosis not present

## 2021-10-14 DIAGNOSIS — L738 Other specified follicular disorders: Secondary | ICD-10-CM | POA: Diagnosis not present

## 2021-10-14 DIAGNOSIS — D485 Neoplasm of uncertain behavior of skin: Secondary | ICD-10-CM | POA: Diagnosis not present

## 2021-10-14 DIAGNOSIS — Z85828 Personal history of other malignant neoplasm of skin: Secondary | ICD-10-CM | POA: Diagnosis not present

## 2021-10-15 ENCOUNTER — Other Ambulatory Visit: Payer: Self-pay | Admitting: Gastroenterology

## 2021-10-15 ENCOUNTER — Other Ambulatory Visit (HOSPITAL_COMMUNITY): Payer: Self-pay

## 2021-10-15 MED ORDER — LAGEVRIO 200 MG PO CAPS
ORAL_CAPSULE | ORAL | 0 refills | Status: DC
  Filled 2021-10-15: qty 40, 5d supply, fill #0

## 2021-10-29 ENCOUNTER — Other Ambulatory Visit (HOSPITAL_COMMUNITY): Payer: Self-pay

## 2021-10-29 DIAGNOSIS — Z940 Kidney transplant status: Secondary | ICD-10-CM | POA: Diagnosis not present

## 2021-10-29 DIAGNOSIS — I5032 Chronic diastolic (congestive) heart failure: Secondary | ICD-10-CM | POA: Diagnosis not present

## 2021-10-29 DIAGNOSIS — D849 Immunodeficiency, unspecified: Secondary | ICD-10-CM | POA: Diagnosis not present

## 2021-10-29 DIAGNOSIS — Z944 Liver transplant status: Secondary | ICD-10-CM | POA: Diagnosis not present

## 2021-10-29 LAB — COMPREHENSIVE METABOLIC PANEL
ALT: 16 IU/L (ref 0–44)
AST: 17 IU/L (ref 0–40)
Albumin/Globulin Ratio: 2 (ref 1.2–2.2)
Albumin: 4.8 g/dL (ref 3.8–4.8)
Alkaline Phosphatase: 139 IU/L — ABNORMAL HIGH (ref 44–121)
BUN/Creatinine Ratio: 11 (ref 10–24)
BUN: 15 mg/dL (ref 8–27)
Bilirubin Total: 0.8 mg/dL (ref 0.0–1.2)
CO2: 23 mmol/L (ref 20–29)
Calcium: 10.1 mg/dL (ref 8.6–10.2)
Chloride: 97 mmol/L (ref 96–106)
Creatinine, Ser: 1.4 mg/dL — ABNORMAL HIGH (ref 0.76–1.27)
Globulin, Total: 2.4 g/dL (ref 1.5–4.5)
Glucose: 148 mg/dL — ABNORMAL HIGH (ref 70–99)
Potassium: 5.5 mmol/L — ABNORMAL HIGH (ref 3.5–5.2)
Sodium: 138 mmol/L (ref 134–144)
Total Protein: 7.2 g/dL (ref 6.0–8.5)
eGFR: 53 mL/min/{1.73_m2} — ABNORMAL LOW (ref >59)

## 2021-10-29 LAB — LIPID PANEL
Chol/HDL Ratio: 4.5 ratio (ref 0.0–5.0)
Cholesterol, Total: 118 mg/dL (ref 100–199)
HDL: 26 mg/dL — ABNORMAL LOW (ref >39)
LDL Chol Calc (NIH): 65 mg/dL (ref 0–99)
Triglycerides: 156 mg/dL — ABNORMAL HIGH (ref 0–149)
VLDL Cholesterol Cal: 27 mg/dL (ref 5–40)

## 2021-11-03 ENCOUNTER — Other Ambulatory Visit (HOSPITAL_COMMUNITY): Payer: Self-pay

## 2021-11-03 MED ORDER — MYCOPHENOLATE MOFETIL 250 MG PO CAPS
ORAL_CAPSULE | ORAL | 3 refills | Status: DC
  Filled 2021-12-15: qty 270, 90d supply, fill #0

## 2021-11-05 ENCOUNTER — Encounter: Payer: Self-pay | Admitting: Family Medicine

## 2021-11-05 ENCOUNTER — Other Ambulatory Visit: Payer: Self-pay

## 2021-11-05 DIAGNOSIS — E875 Hyperkalemia: Secondary | ICD-10-CM

## 2021-11-08 ENCOUNTER — Other Ambulatory Visit (HOSPITAL_COMMUNITY): Payer: Self-pay

## 2021-11-08 MED ORDER — AMOXICILLIN 500 MG PO CAPS
2000.0000 mg | ORAL_CAPSULE | ORAL | 3 refills | Status: DC
  Filled 2021-11-08 – 2021-11-09 (×2): qty 25, 7d supply, fill #0

## 2021-11-09 ENCOUNTER — Other Ambulatory Visit (HOSPITAL_COMMUNITY): Payer: Self-pay

## 2021-11-10 ENCOUNTER — Other Ambulatory Visit (HOSPITAL_COMMUNITY): Payer: Self-pay

## 2021-11-10 MED ORDER — CHLORHEXIDINE GLUCONATE 0.12 % MT SOLN
OROMUCOSAL | 0 refills | Status: DC
  Filled 2021-11-10: qty 473, 16d supply, fill #0

## 2021-11-10 MED ORDER — HYDROCODONE-ACETAMINOPHEN 5-325 MG PO TABS
1.0000 | ORAL_TABLET | Freq: Four times a day (QID) | ORAL | 0 refills | Status: DC | PRN
  Filled 2021-11-10: qty 12, 3d supply, fill #0

## 2021-11-10 NOTE — Telephone Encounter (Signed)
His recent white blood cell count from June 7 and June 29 certainly not too worrisome. I do not get notified of these results when they are done by [facility] so when his labs from 10/5 get done then send me another note and I will take a look at them.

## 2021-11-15 DIAGNOSIS — Z940 Kidney transplant status: Secondary | ICD-10-CM | POA: Diagnosis not present

## 2021-11-15 DIAGNOSIS — D849 Immunodeficiency, unspecified: Secondary | ICD-10-CM | POA: Diagnosis not present

## 2021-11-15 DIAGNOSIS — Z944 Liver transplant status: Secondary | ICD-10-CM | POA: Diagnosis not present

## 2021-11-15 LAB — BASIC METABOLIC PANEL
BUN: 15 (ref 4–21)
CO2: 21 (ref 13–22)
Chloride: 96 — AB (ref 99–108)
Creatinine: 1.6 — AB (ref 0.6–1.3)
Glucose: 147
Potassium: 5.9 mEq/L — AB (ref 3.5–5.1)
Sodium: 138 (ref 137–147)

## 2021-11-15 LAB — COMPREHENSIVE METABOLIC PANEL
Albumin: 4.4 (ref 3.5–5.0)
Calcium: 10 (ref 8.7–10.7)
Globulin: 2.2
eGFR: 47

## 2021-11-15 LAB — HEPATIC FUNCTION PANEL
ALT: 21 U/L (ref 10–40)
AST: 16 (ref 14–40)
Alkaline Phosphatase: 136 — AB (ref 25–125)
Bilirubin, Total: 0.7

## 2021-11-16 ENCOUNTER — Other Ambulatory Visit (HOSPITAL_COMMUNITY): Payer: Self-pay

## 2021-11-16 ENCOUNTER — Encounter: Payer: Self-pay | Admitting: Cardiovascular Disease

## 2021-11-17 ENCOUNTER — Other Ambulatory Visit (HOSPITAL_COMMUNITY): Payer: Self-pay

## 2021-11-17 ENCOUNTER — Encounter: Payer: Self-pay | Admitting: Family Medicine

## 2021-11-17 MED ORDER — LOKELMA 5 G PO PACK
PACK | ORAL | 0 refills | Status: DC
  Filled 2021-11-17: qty 2, 1d supply, fill #0

## 2021-11-17 MED ORDER — AMLODIPINE BESYLATE 5 MG PO TABS
5.0000 mg | ORAL_TABLET | Freq: Every day | ORAL | 0 refills | Status: DC
  Filled 2021-11-17: qty 90, 90d supply, fill #0

## 2021-11-17 NOTE — Telephone Encounter (Signed)
Let's stop the irbesartan for now and increase amlodipine to 5 mg daily. Have him continue to watch home BP readings.

## 2021-11-18 DIAGNOSIS — Z944 Liver transplant status: Secondary | ICD-10-CM | POA: Diagnosis not present

## 2021-11-18 DIAGNOSIS — D849 Immunodeficiency, unspecified: Secondary | ICD-10-CM | POA: Diagnosis not present

## 2021-11-22 ENCOUNTER — Other Ambulatory Visit (HOSPITAL_COMMUNITY): Payer: Self-pay

## 2021-11-25 ENCOUNTER — Other Ambulatory Visit (HOSPITAL_COMMUNITY): Payer: Self-pay

## 2021-11-26 ENCOUNTER — Other Ambulatory Visit (HOSPITAL_COMMUNITY): Payer: Self-pay

## 2021-11-29 ENCOUNTER — Other Ambulatory Visit (HOSPITAL_COMMUNITY): Payer: Self-pay

## 2021-11-29 DIAGNOSIS — D849 Immunodeficiency, unspecified: Secondary | ICD-10-CM | POA: Diagnosis not present

## 2021-11-29 DIAGNOSIS — Z944 Liver transplant status: Secondary | ICD-10-CM | POA: Diagnosis not present

## 2021-12-02 ENCOUNTER — Other Ambulatory Visit (HOSPITAL_COMMUNITY): Payer: Self-pay

## 2021-12-03 ENCOUNTER — Other Ambulatory Visit (HOSPITAL_COMMUNITY): Payer: Self-pay

## 2021-12-03 MED ORDER — LOKELMA 10 G PO PACK
1.0000 | PACK | ORAL | 6 refills | Status: DC
  Filled 2021-12-23: qty 12, 28d supply, fill #0
  Filled 2022-01-17: qty 12, 28d supply, fill #1
  Filled 2022-02-17: qty 12, 28d supply, fill #2
  Filled 2022-03-20: qty 12, 28d supply, fill #3
  Filled 2022-04-16: qty 12, 28d supply, fill #4
  Filled 2022-05-11: qty 12, 28d supply, fill #5
  Filled 2022-06-10: qty 12, 28d supply, fill #6

## 2021-12-07 DIAGNOSIS — D044 Carcinoma in situ of skin of scalp and neck: Secondary | ICD-10-CM | POA: Diagnosis not present

## 2021-12-15 ENCOUNTER — Other Ambulatory Visit (HOSPITAL_COMMUNITY): Payer: Self-pay

## 2021-12-16 ENCOUNTER — Ambulatory Visit (INDEPENDENT_AMBULATORY_CARE_PROVIDER_SITE_OTHER): Payer: Medicare Other

## 2021-12-16 ENCOUNTER — Other Ambulatory Visit (HOSPITAL_COMMUNITY): Payer: Self-pay

## 2021-12-16 DIAGNOSIS — Z23 Encounter for immunization: Secondary | ICD-10-CM | POA: Diagnosis not present

## 2021-12-17 ENCOUNTER — Other Ambulatory Visit (HOSPITAL_COMMUNITY): Payer: Self-pay

## 2021-12-23 ENCOUNTER — Other Ambulatory Visit (HOSPITAL_COMMUNITY): Payer: Self-pay

## 2021-12-23 ENCOUNTER — Encounter: Payer: Self-pay | Admitting: Family Medicine

## 2021-12-24 ENCOUNTER — Other Ambulatory Visit (HOSPITAL_COMMUNITY): Payer: Self-pay

## 2021-12-29 ENCOUNTER — Encounter (HOSPITAL_COMMUNITY): Payer: Self-pay | Admitting: Gastroenterology

## 2021-12-31 ENCOUNTER — Other Ambulatory Visit (HOSPITAL_COMMUNITY): Payer: Self-pay

## 2022-01-06 NOTE — Anesthesia Preprocedure Evaluation (Addendum)
Anesthesia Evaluation  Patient identified by MRN, date of birth, ID band Patient awake    Reviewed: Allergy & Precautions, NPO status , Patient's Chart, lab work & pertinent test results, reviewed documented beta blocker date and time   History of Anesthesia Complications Negative for: history of anesthetic complications  Airway Mallampati: II  TM Distance: >3 FB Neck ROM: Full    Dental  (+) Dental Advisory Given   Pulmonary former smoker Pulm fibrosis H/o hepatopulm syndrome: off of O2 since liver transplant   breath sounds clear to auscultation       Cardiovascular hypertension, Pt. on medications and Pt. on home beta blockers (-) angina + CAD, + CABG and + Peripheral Vascular Disease (s/p AAA repair)   Rhythm:Regular Rate:Normal  '22 ECHO: EF 60-65%, normal LVF, normal diastolic function, normal RVF, mild AS with mean gradient 14 mmHg   Neuro/Psych negative neurological ROS     GI/Hepatic ,GERD  Medicated and Controlled,,(+) Cirrhosis  (NASH)      07/2019 liver transplant   Endo/Other  diabetes (no meds presently)    Renal/GU Renal InsufficiencyRenal disease     Musculoskeletal   Abdominal  (+) + obese  Peds  Hematology negative hematology ROS (+)   Anesthesia Other Findings   Reproductive/Obstetrics                              Anesthesia Physical Anesthesia Plan  ASA: 4  Anesthesia Plan: MAC   Post-op Pain Management: Minimal or no pain anticipated   Induction:   PONV Risk Score and Plan: 2 and Treatment may vary due to age or medical condition  Airway Management Planned: Natural Airway and Nasal Cannula  Additional Equipment: None  Intra-op Plan:   Post-operative Plan:   Informed Consent: I have reviewed the patients History and Physical, chart, labs and discussed the procedure including the risks, benefits and alternatives for the proposed anesthesia with the  patient or authorized representative who has indicated his/her understanding and acceptance.     Dental advisory given  Plan Discussed with: CRNA and Surgeon  Anesthesia Plan Comments:         Anesthesia Quick Evaluation

## 2022-01-07 ENCOUNTER — Other Ambulatory Visit: Payer: Self-pay

## 2022-01-07 ENCOUNTER — Ambulatory Visit (HOSPITAL_COMMUNITY): Payer: Medicare Other | Admitting: Anesthesiology

## 2022-01-07 ENCOUNTER — Ambulatory Visit (HOSPITAL_COMMUNITY)
Admission: RE | Admit: 2022-01-07 | Discharge: 2022-01-07 | Disposition: A | Discharge status: Home / Self Care | Payer: Medicare Other | Attending: Gastroenterology | Admitting: Gastroenterology

## 2022-01-07 ENCOUNTER — Encounter (HOSPITAL_COMMUNITY): Payer: Self-pay | Admitting: Gastroenterology

## 2022-01-07 ENCOUNTER — Encounter (HOSPITAL_COMMUNITY)
Admission: RE | Disposition: A | Discharge status: Home / Self Care | Payer: Self-pay | Source: Home / Self Care | Attending: Gastroenterology

## 2022-01-07 ENCOUNTER — Ambulatory Visit (HOSPITAL_BASED_OUTPATIENT_CLINIC_OR_DEPARTMENT_OTHER): Payer: Medicare Other | Admitting: Anesthesiology

## 2022-01-07 DIAGNOSIS — K7460 Unspecified cirrhosis of liver: Secondary | ICD-10-CM | POA: Insufficient documentation

## 2022-01-07 DIAGNOSIS — Z86010 Personal history of colonic polyps: Secondary | ICD-10-CM | POA: Insufficient documentation

## 2022-01-07 DIAGNOSIS — Z1211 Encounter for screening for malignant neoplasm of colon: Secondary | ICD-10-CM | POA: Insufficient documentation

## 2022-01-07 DIAGNOSIS — K219 Gastro-esophageal reflux disease without esophagitis: Secondary | ICD-10-CM | POA: Insufficient documentation

## 2022-01-07 DIAGNOSIS — I5032 Chronic diastolic (congestive) heart failure: Secondary | ICD-10-CM | POA: Diagnosis not present

## 2022-01-07 DIAGNOSIS — N183 Chronic kidney disease, stage 3 unspecified: Secondary | ICD-10-CM | POA: Diagnosis not present

## 2022-01-07 DIAGNOSIS — Z87891 Personal history of nicotine dependence: Secondary | ICD-10-CM | POA: Diagnosis not present

## 2022-01-07 DIAGNOSIS — I10 Essential (primary) hypertension: Secondary | ICD-10-CM | POA: Diagnosis not present

## 2022-01-07 DIAGNOSIS — K7581 Nonalcoholic steatohepatitis (NASH): Secondary | ICD-10-CM | POA: Diagnosis not present

## 2022-01-07 DIAGNOSIS — I130 Hypertensive heart and chronic kidney disease with heart failure and stage 1 through stage 4 chronic kidney disease, or unspecified chronic kidney disease: Secondary | ICD-10-CM | POA: Diagnosis not present

## 2022-01-07 DIAGNOSIS — K5730 Diverticulosis of large intestine without perforation or abscess without bleeding: Secondary | ICD-10-CM | POA: Diagnosis not present

## 2022-01-07 DIAGNOSIS — J8410 Pulmonary fibrosis, unspecified: Secondary | ICD-10-CM | POA: Insufficient documentation

## 2022-01-07 DIAGNOSIS — Z944 Liver transplant status: Secondary | ICD-10-CM | POA: Diagnosis not present

## 2022-01-07 DIAGNOSIS — I2510 Atherosclerotic heart disease of native coronary artery without angina pectoris: Secondary | ICD-10-CM | POA: Diagnosis not present

## 2022-01-07 DIAGNOSIS — Z6836 Body mass index (BMI) 36.0-36.9, adult: Secondary | ICD-10-CM | POA: Insufficient documentation

## 2022-01-07 DIAGNOSIS — I739 Peripheral vascular disease, unspecified: Secondary | ICD-10-CM | POA: Diagnosis not present

## 2022-01-07 DIAGNOSIS — E669 Obesity, unspecified: Secondary | ICD-10-CM | POA: Diagnosis not present

## 2022-01-07 DIAGNOSIS — E1122 Type 2 diabetes mellitus with diabetic chronic kidney disease: Secondary | ICD-10-CM | POA: Insufficient documentation

## 2022-01-07 HISTORY — PX: COLONOSCOPY WITH PROPOFOL: SHX5780

## 2022-01-07 LAB — POCT I-STAT, CHEM 8
BUN: 12 mg/dL (ref 8–23)
Calcium, Ion: 1.15 mmol/L (ref 1.15–1.40)
Chloride: 101 mmol/L (ref 98–111)
Creatinine, Ser: 1.5 mg/dL — ABNORMAL HIGH (ref 0.61–1.24)
Glucose, Bld: 147 mg/dL — ABNORMAL HIGH (ref 70–99)
HCT: 46 % (ref 39.0–52.0)
Hemoglobin: 15.6 g/dL (ref 13.0–17.0)
Potassium: 4.3 mmol/L (ref 3.5–5.1)
Sodium: 136 mmol/L (ref 135–145)
TCO2: 24 mmol/L (ref 22–32)

## 2022-01-07 SURGERY — COLONOSCOPY WITH PROPOFOL
Anesthesia: Monitor Anesthesia Care

## 2022-01-07 MED ORDER — PROPOFOL 500 MG/50ML IV EMUL
INTRAVENOUS | Status: DC | PRN
  Administered 2022-01-07: 125 ug/kg/min via INTRAVENOUS

## 2022-01-07 MED ORDER — LACTATED RINGERS IV SOLN: INTRAVENOUS | Status: DC

## 2022-01-07 SURGICAL SUPPLY — 22 items

## 2022-01-07 NOTE — Discharge Instructions (Signed)

## 2022-01-07 NOTE — H&P (Signed)
William [name] HPI: He is s/p liver transplantation in 08/02/2019.  Since that time he reports feeling well.  His last colonoscopy was with Dr. [name] in 2018 with normal findings, but in 2016 he had a couple of adenomas.  In 2005 his colonoscopy showed a TVA.  The patient denies any problems with chest pain, SOB, or MI.   Past Medical History:  Diagnosis Date   AAA (abdominal aortic aneurysm) ([place]) 03/2014   repaired 2020   Ascending aortic aneurysm (HCC)    unrepaired. Stable at 4.3 cm on imaging by [facility] thoracic surg 10/2019 and 10/2020.   Bilateral renal cysts    simple (03/2014 MRI)   CAD (coronary artery disease)    Cholelithiases 2018   asymptomatic   Chronic diastolic heart failure (HCC)    Chronic renal insufficiency, stage 3 (moderate) ([place]) 2022   baseline sCr 1.3-1.5 (GFR 55)   COVID-19 virus infection 02/2020   sotrovimab infusion   Diabetes mellitus with complication ([place]) 96/7591   A1c 6.8%. A1c 5.4% 11/2019   Gout    initially dx'd by diag arthrocentesis of elbow effusion in winter 2021, 3 episodes, all came after being started on chlorthalidone->I d/c'd chlorthal.   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  Improving + off oxygen as of 3 mo s/p liver transplant.  Doing well/resolved as of 02/2020 [facility] pulm f/u->they'll continue to follow.   History of blood transfusion 2018 X 4 dates   Chronic GI blood loss of unknown location despite full GI endoscopic eval, etc   History of liver transplant ([place]) 07/2019   [facility]   Hyperlipemia, mixed    elevated LFTs when on statins.     Hypertension    Cr bump 04/01/16 so I changed benicar-hct to benicar plain and added amlodipine 5 mg.   Increased prostate specific antigen (PSA) velocity 2021   0.11 to 4.77 from 2020 to 2021 (he got a liver transplant and was put on antirejection meds between these psa checks).   Iron deficiency anemia 05/2016   Acute blood loss anemia: hospitalized, required transfusion x 3 U: colonoscopy and  capsule study unrevealing.  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  W/u for ? hemolytic anemia to be pursued by hematologist as outpt.  Dr. [name], GI at [facility] following, too---he rec'd onc do bone marrow bx as of Jan 2019   Iron deficiency anemia due to chronic blood loss 2018/19   GI: transfusions x >20 required; multiple endoscopies and bleeding scans unrevealing. Lysteda and octreotide + monthly iron infusions as of 04/2017. Iron infusions changed to every other month as of 09/2018 hem f/u.   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  Changes noted on all CXRs in 2018/2019.  Liver Transplant eval 03/2018->mild restriction on PFTs but no obstruction.  See further details in PM[facility] "pulm fibrosis" section   Microscopic hematuria    Eval unremarkable by Dr. Eulogio Ditch.   Nonmelanoma skin cancer 08/15/2018   2020 BCC nose, excised.  2022 R side of neck->mohs   Obesity    Pulmonary fibrosis (HCC)    PFTs: restrictive lung dz ([facility] transplant eval 05/2018). [facility] pulm eval felt this was likely hepatopulmonary syndrome->causing his  hypoxia->low likelihood of progression (as of 10/04/18 transplant clinic f/u). Pulm rehab helping as of 2020/2021. OFF oxygen as of 10/2019 transp f/u.   Spleen enlarged     Past Surgical History:  Procedure Laterality Date   ABDOMINAL AORTIC ANEURYSM REPAIR  08/23/2018   [facility]   CARDIAC CATHETERIZATION     CARDIOVASCULAR STRESS TEST  01/17/2012; 05/04/16   Normal stress nuclear study x 2 (2018--Normal perfusion. LVEF 66% with normal wall motion. This is a low risk study).   CERVICAL DISCECTOMY  1992   COLONOSCOPY N/A 05/27/2016   No site/explanation for blood loss  found.  Erythematous mucosa in cecum and ascending colon--Cecal bx: normal.  Procedure: COLONOSCOPY;  Surgeon: Carol Ada, MD;  Location: [facility] ENDOSCOPY;  Service: Endoscopy;  Laterality: N/A;   COLONOSCOPY W/ POLYPECTOMY  09/10/2014   Polypectomy x 2: recall 5 yrs (Dr. [name]).   CORONARY ANGIOPLASTY     CORONARY ARTERY BYPASS GRAFT  03/2006   CT ABDOMEN WITH CONTRAST ([place] HX)  11/2018   CT CHEST WWO CONTAST ([place] HX)  07/11/2019   No significant change in interstitial lung disease with possible underlying hepatopulmonary syndrome, Increased ascites in the setting of cirrhosis, tiny new right basilar nodule   DEXA  03/07/2019   T score 0.2/NORMAL   ENTEROSCOPY N/A 07/31/2016   Procedure: ENTEROSCOPY;  Surgeon: Ladene Artist, MD;  Location: [facility] ENDOSCOPY;  Service: Endoscopy;  Laterality: N/A;   ENTEROSCOPY N/A 12/02/2016   Procedure: ENTEROSCOPY;  Surgeon: Carol Ada, MD;  Location: [facility] ENDOSCOPY;  Service: Endoscopy;  Laterality: N/A;   ESOPHAGOGASTRODUODENOSCOPY N/A 05/25/2016   No site/explanation for blood loss found.  Procedure: ESOPHAGOGASTRODUODENOSCOPY (EGD);  Surgeon: Juanita Craver, MD;  Location: [facility] ENDOSCOPY;  Service: Endoscopy;  Laterality: N/A;   ESOPHAGOGASTRODUODENOSCOPY N/A 09/23/2016   Procedure: ESOPHAGOGASTRODUODENOSCOPY (EGD);  Surgeon: Carol Ada, MD;  Location: [place];  Service: Endoscopy;  Laterality: N/A;   ESOPHAGOGASTRODUODENOSCOPY (EGD) WITH PROPOFOL N/A 08/21/2018   Procedure: ESOPHAGOGASTRODUODENOSCOPY (EGD) WITH PROPOFOL;  Surgeon: Carol Ada, MD;  Location: [facility] ENDOSCOPY;  Service: Endoscopy;  Laterality: N/A;   GIVENS CAPSULE STUDY N/A 05/27/2016   No source identified.  Repeat 10/2016--results pending.  Procedure: GIVENS CAPSULE STUDY;  Surgeon: Carol Ada, MD;  Location: [facility] ENDOSCOPY;  Service: Endoscopy;  Laterality: N/A;   GIVENS CAPSULE STUDY N/A 10/15/2016   Procedure: GIVENS CAPSULE STUDY;  Surgeon: Carol Ada, MD;  Location: [facility]  ENDOSCOPY;  Service: Endoscopy;  Laterality: N/A;   GIVENS CAPSULE STUDY N/A 02/13/2017   Procedure: GIVENS CAPSULE STUDY;  Surgeon: Carol Ada, MD;  Location: [place];  Service: Endoscopy;  Laterality: N/A;   LIVER TRANSPLANT  08/02/2019   For NASH cirrhosis. [place]   NM GI BLOOD LOSS  01/27/2017   NEGATIVE   SMALL BOWEL ENTEROSCOPY  10/2016   ([facility], Dr. [name]: Double balloon enteroscopy--to the level of proximal ileum) jejunal polyps- which were removed--not bleeding & benign.  No source of bleeding was identified.   TRANSTHORACIC ECHOCARDIOGRAM  05/25/2016; 10/2017   05/2016: EF 60%, normal wall motion, grd II DD, mild aortic stenosis, dilated aortic root and ascending aorta. 10/2017: Mild LVH; no mention made of LV function.  Moderate aorticstenosis with mean gradient 19 peak gradient 41. AVA 1.1 cm^2.  05/2018 EF 55%,4.3 cm ascend aneur, mild AS. 10/2020 EF 60-65%, normal diast fxn, mod AV sclerosis w/out sten/regurg.   VASECTOMY  1977    Family History  Problem Relation Age of Onset  Hypertension Mother    Cancer - Other Mother        liver cancer   Heart disease Father    Heart attack Father    Cancer - Lung Father    Diabetes Father    Liver disease Sister    Anemia Sister    Heart disease Sister    Heart attack Sister    Heart disease Brother        CABG 70 YEARS AGO   Hypertension Brother    Mesothelioma Brother        HALF-BROTHER   Cancer - Other Brother        CLL   Cancer - Lung Brother        Mets to brain   Cancer - Lung Sister        HALF-SISTER   Liver disease Brother    Heart disease Brother        CABG-2012   Emphysema Maternal Grandfather    Cancer - Other Paternal Grandmother        Stomach   Heart attack Paternal Grandfather     Social History:  reports that he has quit smoking. His smoking use included cigarettes. He has never used smokeless tobacco. He reports that he does not drink alcohol and does not use  drugs.  Allergies:  Allergies  Allergen Reactions   Niacinamide Itching    Improved upon discontinuation    Thiazide-Type Diuretics Other (See Comments)    Chlorthalidone caused gout   Quinolones Other (See Comments)    CONTRAINDICATED D/T PT HAVING AAA    Medications: Scheduled: Continuous:  No results found for this or any previous visit (from the past 24 hour(s)).   No results found.  ROS:  As stated above in the HPI otherwise negative.  Weight 106.6 kg.    PE: Gen: NAD, Alert and Oriented HEENT:  [place]/AT, EOMI Neck: Supple, no LAD Lungs: CTA Bilaterally CV: RRR without M/G/R ABD: Soft, NTND, +BS Ext: No C/C/E  Assessment/Plan: 1) Personal history of polyps - colonoscopy.  Kataleena Holsapple D 01/07/2022, 7:18 AM

## 2022-01-07 NOTE — Op Note (Signed)
[facility] Patient Name: William [name] Procedure Date: 01/07/2022 MRN: [id number] Attending MD: Carol Ada , MD, [id number] Date of Birth: October 29, 1950 CSN: [id number] Age: 71 Admit Type: Outpatient Procedure:                Colonoscopy Indications:              High risk colon cancer surveillance: Personal                            history of colonic polyps Providers:                Carol Ada, MD, Mikey College, RN Referring MD:              Medicines:                 Complications:            No immediate complications. Estimated Blood Loss:     Estimated blood loss: none. Procedure:                Pre-Anesthesia Assessment:                           - Prior to the procedure, a History and Physical                            was performed, and patient medications and                            allergies were reviewed. The patient's tolerance of                            previous anesthesia was also reviewed. The risks                            and benefits of the procedure and the sedation                            options and risks were discussed with the patient.                            All questions were answered, and informed consent                            was obtained. Prior Anticoagulants: The patient has                            taken no anticoagulant or antiplatelet agents. ASA                            Grade Assessment: II - A patient with mild systemic                            disease. After reviewing the risks and benefits,  the patient was deemed in satisfactory condition to                            undergo the procedure.                           - Sedation was administered by an anesthesia                            professional. Deep sedation was attained.                           After obtaining informed consent, the colonoscope                            was passed under direct vision. Throughout the                             procedure, the patient's blood pressure, pulse, and                            oxygen saturations were monitored continuously. The                            CF-HQ190L ([id number] Olympus colonoscope was                            introduced through the anus and advanced to the the                            cecum, identified by appendiceal orifice and                            ileocecal valve. The colonoscopy was performed                            without difficulty. The patient tolerated the                            procedure well. The quality of the bowel                            preparation was evaluated using the BBPS [facility]                            Bowel Preparation Scale) with scores of: Right                            Colon = 2 (minor amount of residual staining, small                            fragments of stool and/or opaque liquid, but mucosa  seen well), Transverse Colon = 2 (minor amount of                            residual staining, small fragments of stool and/or                            opaque liquid, but mucosa seen well) and Left Colon                            = 2 (minor amount of residual staining, small                            fragments of stool and/or opaque liquid, but mucosa                            seen well). The total BBPS score equals 6. The                            quality of the bowel preparation was good. The                            ileocecal valve, appendiceal orifice, and rectum                            were photographed. Scope In: 7:38:42 AM Scope Out: 7:59:47 AM Total Procedure Duration: 0 hours 21 minutes 5 seconds  Findings:      Scattered medium-mouthed and small-mouthed diverticula were found in the       sigmoid colon. Impression:               - Diverticulosis in the sigmoid colon.                           - No specimens collected. Moderate Sedation:      Not  Applicable - Patient had care per Anesthesia. Recommendation:           - Patient has a contact number available for                            emergencies. The signs and symptoms of potential                            delayed complications were discussed with the                            patient. Return to normal activities tomorrow.                            Written discharge instructions were provided to the                            patient.                           - Resume previous  diet.                           - Continue present medications.                           - Repeat colonoscopy is not recommended for                            surveillance. Procedure Code(s):        --- Professional ---                           [phone], Colonoscopy, flexible; diagnostic, including                            collection of specimen(s) by brushing or washing,                            when performed (separate procedure) Diagnosis Code(s):        --- Professional ---                           Z86.010, Personal history of colonic polyps                           K57.30, Diverticulosis of large intestine without                            perforation or abscess without bleeding CPT copyright 2022 [employer]. All rights reserved. The codes documented in this report are preliminary and upon coder review may  be revised to meet current compliance requirements. Carol Ada, MD Carol Ada, MD 01/07/2022 8:15:51 AM This report has been signed electronically. Number of Addenda: 0

## 2022-01-07 NOTE — Transfer of Care (Signed)
Immediate Anesthesia Transfer of Care Note  Patient: William [name]  Procedure(s) Performed: COLONOSCOPY WITH PROPOFOL  Patient Location: PACU  Anesthesia Type:MAC  Level of Consciousness: sedated, patient cooperative, and responds to stimulation  Airway & Oxygen Therapy: Patient Spontanous Breathing and Patient connected to face mask oxygen  Post-op Assessment: Report given to RN and Post -op Vital signs reviewed and stable  Post vital signs: Reviewed and stable  Last Vitals:  Vitals Value Taken Time  BP 128/74 01/07/22 0805  Temp 36.3 C 01/07/22 0805  Pulse 66 01/07/22 0807  Resp 21 01/07/22 0807  SpO2 100 % 01/07/22 0807  Vitals shown include unvalidated device data.  Last Pain:  Vitals:   01/07/22 0805  TempSrc: Temporal  PainSc: 0-No pain         Complications: No notable events documented.

## 2022-01-07 NOTE — Anesthesia Postprocedure Evaluation (Signed)
Anesthesia Post Note  Patient: William [name]  Procedure(s) Performed: COLONOSCOPY WITH PROPOFOL     Patient location during evaluation: Endoscopy Anesthesia Type: MAC Level of consciousness: awake and alert, patient cooperative and oriented Pain management: pain level controlled Vital Signs Assessment: post-procedure vital signs reviewed and stable Respiratory status: nonlabored ventilation, spontaneous breathing and respiratory function stable Cardiovascular status: blood pressure returned to baseline and stable Postop Assessment: no apparent nausea or vomiting and able to ambulate Anesthetic complications: no   No notable events documented.  Last Vitals:  Vitals:   01/07/22 0815 01/07/22 0825  BP: (!) 146/88 (!) 150/78  Pulse: 72 67  Resp: (!) 28 16  Temp:    SpO2: 94% 94%    Last Pain:  Vitals:   01/07/22 0825  TempSrc:   PainSc: 0-No pain                 Jonnatan Hanners,E. Rayshon Albaugh

## 2022-01-10 ENCOUNTER — Encounter (HOSPITAL_COMMUNITY): Payer: Self-pay | Admitting: Gastroenterology

## 2022-01-10 NOTE — Progress Notes (Signed)
Attempted, unable to leave message/bt

## 2022-01-12 ENCOUNTER — Other Ambulatory Visit (HOSPITAL_COMMUNITY): Payer: Self-pay

## 2022-01-12 ENCOUNTER — Other Ambulatory Visit: Payer: Self-pay

## 2022-01-13 ENCOUNTER — Encounter: Payer: Self-pay | Admitting: Family Medicine

## 2022-01-13 ENCOUNTER — Ambulatory Visit (INDEPENDENT_AMBULATORY_CARE_PROVIDER_SITE_OTHER): Payer: Medicare Other | Admitting: Family Medicine

## 2022-01-13 ENCOUNTER — Other Ambulatory Visit (HOSPITAL_COMMUNITY): Payer: Self-pay

## 2022-01-13 VITALS — BP 168/91 | HR 84 | Temp 97.6°F | Ht 69.0 in | Wt 239.4 lb

## 2022-01-13 DIAGNOSIS — N1831 Chronic kidney disease, stage 3a: Secondary | ICD-10-CM

## 2022-01-13 DIAGNOSIS — Z944 Liver transplant status: Secondary | ICD-10-CM

## 2022-01-13 DIAGNOSIS — E782 Mixed hyperlipidemia: Secondary | ICD-10-CM | POA: Diagnosis not present

## 2022-01-13 DIAGNOSIS — E1121 Type 2 diabetes mellitus with diabetic nephropathy: Secondary | ICD-10-CM

## 2022-01-13 DIAGNOSIS — Z0000 Encounter for general adult medical examination without abnormal findings: Secondary | ICD-10-CM

## 2022-01-13 DIAGNOSIS — I10 Essential (primary) hypertension: Secondary | ICD-10-CM

## 2022-01-13 DIAGNOSIS — J300 Vasomotor rhinitis: Secondary | ICD-10-CM

## 2022-01-13 LAB — LIPID PANEL
Cholesterol: 114 mg/dL (ref 0–200)
HDL: 25.6 mg/dL — ABNORMAL LOW (ref >39.00)
LDL Cholesterol: 53 mg/dL (ref 0–99)
NonHDL: 88.58
Total CHOL/HDL Ratio: 4
Triglycerides: 179 mg/dL — ABNORMAL HIGH (ref 0.0–149.0)
VLDL: 35.8 mg/dL (ref 0.0–40.0)

## 2022-01-13 LAB — COMPREHENSIVE METABOLIC PANEL
ALT: 15 U/L (ref 0–53)
AST: 14 U/L (ref 0–37)
Albumin: 4.8 g/dL (ref 3.5–5.2)
Alkaline Phosphatase: 123 U/L — ABNORMAL HIGH (ref 39–117)
BUN: 12 mg/dL (ref 6–23)
CO2: 32 mEq/L (ref 19–32)
Calcium: 10 mg/dL (ref 8.4–10.5)
Chloride: 99 mEq/L (ref 96–112)
Creatinine, Ser: 1.3 mg/dL (ref 0.40–1.50)
GFR: 54.95 mL/min — ABNORMAL LOW (ref >60.00)
Glucose, Bld: 150 mg/dL — ABNORMAL HIGH (ref 70–99)
Potassium: 5 mEq/L (ref 3.5–5.1)
Sodium: 138 mEq/L (ref 135–145)
Total Bilirubin: 0.9 mg/dL (ref 0.2–1.2)
Total Protein: 7.4 g/dL (ref 6.0–8.3)

## 2022-01-13 LAB — CBC WITH DIFFERENTIAL/PLATELET
Basophils Absolute: 0.1 10*3/uL (ref 0.0–0.1)
Basophils Relative: 0.6 % (ref 0.0–3.0)
Eosinophils Absolute: 1.9 10*3/uL — ABNORMAL HIGH (ref 0.0–0.7)
Eosinophils Relative: 15.3 % — ABNORMAL HIGH (ref 0.0–5.0)
HCT: 45.8 % (ref 39.0–52.0)
Hemoglobin: 14.9 g/dL (ref 13.0–17.0)
Lymphocytes Relative: 9.8 % — ABNORMAL LOW (ref 12.0–46.0)
Lymphs Abs: 1.2 10*3/uL (ref 0.7–4.0)
MCHC: 32.5 g/dL (ref 30.0–36.0)
MCV: 85.6 fl (ref 78.0–100.0)
Monocytes Absolute: 1 10*3/uL (ref 0.1–1.0)
Monocytes Relative: 7.7 % (ref 3.0–12.0)
Neutro Abs: 8.4 10*3/uL — ABNORMAL HIGH (ref 1.4–7.7)
Neutrophils Relative %: 66.6 % (ref 43.0–77.0)
Platelets: 402 10*3/uL — ABNORMAL HIGH (ref 150.0–400.0)
RBC: 5.35 Mil/uL (ref 4.22–5.81)
RDW: 17.1 % — ABNORMAL HIGH (ref 11.5–15.5)
WBC: 12.5 10*3/uL — ABNORMAL HIGH (ref 4.0–10.5)

## 2022-01-13 LAB — POCT GLYCOSYLATED HEMOGLOBIN (HGB A1C)
HbA1c POC (<> result, manual entry): 6.3 % (ref 4.0–5.6)
HbA1c, POC (controlled diabetic range): 6.3 % (ref 0.0–7.0)
HbA1c, POC (prediabetic range): 6.3 % (ref 5.7–6.4)
Hemoglobin A1C: 6.3 % — AB (ref 4.0–5.6)

## 2022-01-13 LAB — MICROALBUMIN / CREATININE URINE RATIO
Creatinine,U: 36.2 mg/dL
Microalb Creat Ratio: 11.3 mg/g (ref 0.0–30.0)
Microalb, Ur: 4.1 mg/dL — ABNORMAL HIGH (ref 0.0–1.9)

## 2022-01-13 MED ORDER — FUROSEMIDE 20 MG PO TABS
20.0000 mg | ORAL_TABLET | Freq: Every day | ORAL | 3 refills | Status: DC
  Filled 2022-01-13: qty 90, 90d supply, fill #0

## 2022-01-13 MED ORDER — METOPROLOL TARTRATE 50 MG PO TABS: ORAL_TABLET | Freq: Two times a day (BID) | ORAL | 3 refills | Status: DC

## 2022-01-13 MED ORDER — AZELASTINE HCL 0.1 % NA SOLN
2.0000 | Freq: Two times a day (BID) | NASAL | 12 refills | Status: DC
  Filled 2022-01-13: qty 30, 25d supply, fill #0

## 2022-01-13 MED ORDER — FUROSEMIDE 20 MG PO TABS: 20.0000 mg | ORAL_TABLET | Freq: Every day | ORAL | 3 refills | Status: DC

## 2022-01-13 NOTE — Progress Notes (Signed)
Office Note 01/13/2022  CC:  Chief Complaint  Patient presents with   Annual Exam    Pt is fasting   Patient is a 71 y.o. male who is here for annual health maintenance exam and 6 mo f/u HTN, CRI IIIa, and DM 2 . A/P as of last visit: "#1 type 2 diabetes, no meds. Needs to improve diet and increase exercise. Hemoglobin A1c and fasting glucose today. Repeat urine microalbumin/creatinine at next follow-up in 6 months.   #2 hypertension, well controlled on amlodipine 5 mg a day, irbesartan 75 mg a day, and Lopressor 50 mg twice a day.   #3 chronic renal insufficiency stage III. Monitor electrolytes and creatinine today.   #5  Hyperlipidemia->hx of statin intolerance.  Pt with hx of liver failure, now is status post liver transplant --->unless trigs>400 or LDL >130 I think risks of zetia and/or meds for hypertriglyceridemia outweigh benefits.   #6 chronic diastolic heart failure. No sign of volume overload. Continue Lasix 20 mg a day, Lopressor 50 mg twice a day, irbesartan 75 mg a day. He follows up with Dr. Claiborne Billings in August this year.   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 Doing very well.  Continue tacrolimus and mycophenolate as per transplant clinic.  Next follow-up with them is June 28.   #8 preventative health: Due for PSA screening today--ordered."  INTERIM HX: Gracen feels very well. He has some chronic nasal congestion that seems to get worse at night.  He takes nasal spray but is not sure which kind.  There is nothing on his medication list.  Home bp's: 130s/70s  No home glucose monitoring.  Past Medical History:  Diagnosis Date   AAA (abdominal aortic aneurysm) ([place]) 03/2014   repaired 2020   Ascending aortic aneurysm (HCC)    unrepaired. Stable at 4.3 cm on imaging by [facility] thoracic surg 10/2019 and 10/2020.   Bilateral renal cysts    simple (03/2014 MRI)   CAD (coronary artery disease)    Cholelithiases 2018    asymptomatic   Chronic diastolic heart failure (HCC)    Chronic renal insufficiency, stage 3 (moderate) ([place]) 2022   baseline sCr 1.3-1.5 (GFR 55)   COVID-19 virus infection 02/2020   sotrovimab infusion   Diabetes mellitus with complication ([place]) 65/9935   A1c 6.8%. A1c 5.4% 11/2019   Diverticulosis    Gout    initially dx'd by diag arthrocentesis of elbow effusion in winter 2021, 3 episodes, all came after being started on chlorthalidone->I d/c'd chlorthal.   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  Improving + off oxygen as of 3 mo s/p liver transplant.  Doing well/resolved as of 02/2020 [facility] pulm f/u->they'll continue to follow.   History of blood transfusion 2018 X 4 dates   Chronic GI blood loss of unknown location despite full GI endoscopic eval, etc   History of liver transplant ([place]) 07/2019   [facility]   Hyperlipemia, mixed    elevated LFTs when on statins.     Hypertension    Cr bump 04/01/16 so I changed benicar-hct to benicar plain and added amlodipine 5 mg.   Increased prostate specific antigen (PSA) velocity 2021   0.11 to 4.77 from 2020 to 2021 (he got a liver transplant and was put on antirejection meds between these psa checks).   Iron deficiency anemia 05/2016   Acute blood loss anemia: hospitalized, required transfusion x 3 U: colonoscopy and capsule study unrevealing.  Readmitted 6/22-6/25,  2018 for symptomatic anemia again, got transfused x 4U, EGD with grd I esoph varices and port hyt gastropathy.  W/u for ? hemolytic anemia to be pursued by hematologist as outpt.  Dr. [name], GI at [facility] following, too---he rec'd onc do bone marrow bx as of Jan 2019   Iron deficiency anemia due to chronic blood loss 2018/19   GI: transfusions x >20 required; multiple endoscopies and bleeding scans unrevealing. Lysteda and octreotide + monthly iron infusions as of 04/2017. Iron infusions changed to every other month as of 09/2018 hem f/u.   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  Changes noted on all CXRs in 2018/2019.  Liver Transplant eval 03/2018->mild restriction on PFTs but no obstruction.  See further details in [facility] "pulm fibrosis" section   Microscopic hematuria    Eval unremarkable by Dr. Eulogio Ditch.   Nonmelanoma skin cancer 08/15/2018   2020 BCC nose, excised.  2022 R side of neck->mohs   Obesity    Pulmonary fibrosis (HCC)    PFTs: restrictive lung dz ([facility] Liver transplant eval 05/2018). [facility] pulm eval felt this was likely hepatopulmonary syndrome->causing his hypoxia->low likelihood of progression (as of 10/04/18 transplant clinic f/u). Pulm rehab helping as of 2020/2021. OFF oxygen as of 10/2019 transp f/u.   Spleen enlarged     Past Surgical History:  Procedure Laterality Date   ABDOMINAL AORTIC ANEURYSM REPAIR  08/23/2018   [facility]   CARDIAC CATHETERIZATION     CARDIOVASCULAR STRESS TEST  01/17/2012; 05/04/16   Normal stress nuclear study x 2 (2018--Normal perfusion. LVEF 66% with normal wall motion. This is a low risk study).   CERVICAL DISCECTOMY  1992   COLONOSCOPY N/A 05/27/2016   No site/explanation for blood loss found.  Erythematous mucosa in cecum and ascending colon--Cecal bx: normal.  Procedure: COLONOSCOPY;  Surgeon: Carol Ada, MD;  Location: [facility] ENDOSCOPY;  Service: Endoscopy;  Laterality: N/A;   COLONOSCOPY     01/07/22 diverticulosis but NO POLYPS.  No further recommended   COLONOSCOPY W/ POLYPECTOMY  09/10/2014   Polypectomy x 2: recall 5 yrs (Dr. [name]).   COLONOSCOPY WITH PROPOFOL N/A 01/07/2022   Procedure: COLONOSCOPY WITH PROPOFOL;  Surgeon: Carol Ada, MD;  Location: [facility] ENDOSCOPY;  Service: Gastroenterology;  Laterality: N/A;   CORONARY ANGIOPLASTY     CORONARY ARTERY BYPASS GRAFT  03/2006   CT ABDOMEN WITH CONTRAST ([place]  HX)  11/2018   CT CHEST WWO CONTAST ([place] HX)  07/11/2019   No significant change in interstitial lung disease with possible underlying hepatopulmonary syndrome, Increased ascites in the setting of cirrhosis, tiny new right basilar nodule   DEXA  03/07/2019   T score 0.2/NORMAL   ENTEROSCOPY N/A 07/31/2016   Procedure: ENTEROSCOPY;  Surgeon: Ladene Artist, MD;  Location: [facility] ENDOSCOPY;  Service: Endoscopy;  Laterality: N/A;   ENTEROSCOPY N/A 12/02/2016   Procedure: ENTEROSCOPY;  Surgeon: Carol Ada, MD;  Location: [facility] ENDOSCOPY;  Service: Endoscopy;  Laterality: N/A;   ESOPHAGOGASTRODUODENOSCOPY N/A 05/25/2016   No site/explanation for blood loss found.  Procedure: ESOPHAGOGASTRODUODENOSCOPY (EGD);  Surgeon: Juanita Craver, MD;  Location: [facility] ENDOSCOPY;  Service: Endoscopy;  Laterality: N/A;   ESOPHAGOGASTRODUODENOSCOPY N/A 09/23/2016   Procedure: ESOPHAGOGASTRODUODENOSCOPY (EGD);  Surgeon: Carol Ada, MD;  Location: [place];  Service: Endoscopy;  Laterality: N/A;  ESOPHAGOGASTRODUODENOSCOPY (EGD) WITH PROPOFOL N/A 08/21/2018   Procedure: ESOPHAGOGASTRODUODENOSCOPY (EGD) WITH PROPOFOL;  Surgeon: Carol Ada, MD;  Location: [facility] ENDOSCOPY;  Service: Endoscopy;  Laterality: N/A;   GIVENS CAPSULE STUDY N/A 05/27/2016   No source identified.  Repeat 10/2016--results pending.  Procedure: GIVENS CAPSULE STUDY;  Surgeon: Carol Ada, MD;  Location: [facility] ENDOSCOPY;  Service: Endoscopy;  Laterality: N/A;   GIVENS CAPSULE STUDY N/A 10/15/2016   Procedure: GIVENS CAPSULE STUDY;  Surgeon: Carol Ada, MD;  Location: [facility] ENDOSCOPY;  Service: Endoscopy;  Laterality: N/A;   GIVENS CAPSULE STUDY N/A 02/13/2017   Procedure: GIVENS CAPSULE STUDY;  Surgeon: Carol Ada, MD;  Location: [place];  Service: Endoscopy;  Laterality: N/A;   LIVER TRANSPLANT  08/02/2019   For NASH cirrhosis. [place]   NM GI BLOOD LOSS  01/27/2017   NEGATIVE   SMALL BOWEL ENTEROSCOPY  10/2016   ([facility],  Dr. [name]: Double balloon enteroscopy--to the level of proximal ileum) jejunal polyps- which were removed--not bleeding & benign.  No source of bleeding was identified.   TRANSTHORACIC ECHOCARDIOGRAM  05/25/2016; 10/2017   05/2016: EF 60%, normal wall motion, grd II DD, mild aortic stenosis, dilated aortic root and ascending aorta. 10/2017: Mild LVH; no mention made of LV function.  Moderate aorticstenosis with mean gradient 19 peak gradient 41. AVA 1.1 cm^2.  05/2018 EF 55%,4.3 cm ascend aneur, mild AS. 10/2020 EF 60-65%, normal diast fxn, mod AV sclerosis w/out sten/regurg.   VASECTOMY  1977    Family History  Problem Relation Age of Onset   Hypertension Mother    Cancer - Other Mother        liver cancer   Heart disease Father    Heart attack Father    Cancer - Lung Father    Diabetes Father    Liver disease Sister    Anemia Sister    Heart disease Sister    Heart attack Sister    Heart disease Brother        CABG 79 YEARS AGO   Hypertension Brother    Mesothelioma Brother        HALF-BROTHER   Cancer - Other Brother        CLL   Cancer - Lung Brother        Mets to brain   Cancer - Lung Sister        HALF-SISTER   Liver disease Brother    Heart disease Brother        CABG-2012   Emphysema Maternal Grandfather    Cancer - Other Paternal Grandmother        Stomach   Heart attack Paternal Grandfather     Social History   Socioeconomic History   Marital status: Married    Spouse name: Not on file   Number of children: Not on file   Years of education: Not on file   Highest education level: 10th grade  Occupational History   Not on file  Tobacco Use   Smoking status: Former    Types: Cigarettes   Smokeless tobacco: Never   Tobacco comments:    QUIT I 1983  Vaping Use   Vaping Use: Never used  Substance and Sexual Activity   Alcohol use: No   Drug use: No   Sexual activity: Yes  Other Topics Concern   Not on file  Social History Narrative   Married, 3 grown  children, 3 GCs.   Educ: 10th grade.  Occupation: Retired Brewing technologist.   Tob: quit 1983, smoked about 20 pack-yr hx prior.   No alcohol.   Social Determinants of Health   Financial Resource Strain: Low Risk  (07/21/2021)   Overall Financial Resource Strain (CARDIA)    Difficulty of Paying Living Expenses: Not hard at all  Food Insecurity: No Food Insecurity (07/21/2021)   Hunger Vital Sign    Worried About Running Out of Food in the Last Year: Never true    Ran Out of Food in the Last Year: Never true  Transportation Needs: No Transportation Needs (07/21/2021)   PRAPARE - Hydrologist (Medical): No    Lack of Transportation (Non-Medical): No  Physical Activity: Inactive (07/21/2021)   Exercise Vital Sign    Days of Exercise per Week: 0 days    Minutes of Exercise per Session: 0 min  Stress: No Stress Concern Present (07/21/2021)   [place]    Feeling of Stress : Not at all  Social Connections: Moderately Integrated (07/21/2021)   Social Connection and Isolation Panel [NHANES]    Frequency of Communication with Friends and Family: More than three times a week    Frequency of Social Gatherings with Friends and Family: More than three times a week    Attends Religious Services: 1 to 4 times per year    Active Member of [employer] or Organizations: No    Attends Archivist Meetings: Never    Marital Status: Married  Human resources officer Violence: Not At Risk (07/21/2021)   Humiliation, Afraid, Rape, and Kick questionnaire    Fear of Current or Ex-Partner: No    Emotionally Abused: No    Physically Abused: No    Sexually Abused: No    Outpatient Medications Prior to Visit  Medication Sig Dispense Refill   amLODipine (NORVASC) 5 MG tablet Take 1 tablet (5 mg total) by mouth daily. 90 tablet 0   Magnesium 400 MG TABS Take 400 mg by mouth daily.     mycophenolate (CELLCEPT) 250 MG  capsule Take 2 capsules by mouth  in the morning and 1 capsule in the evening 270 capsule 3   pantoprazole (PROTONIX) 40 MG tablet Take 1 tablet (40 mg total) by mouth once daily 90 tablet 3   rosuvastatin (CRESTOR) 10 MG tablet Take 1 tablet (10 mg total) by mouth daily. (Patient taking differently: Take 10 mg by mouth at bedtime.) 90 tablet 3   sodium zirconium cyclosilicate (LOKELMA) 10 g PACK packet Take 10 g (1 packet total) by mouth every Monday, Wednesday and Friday. Space 2 hours apart from Tacrolimus. 12 packet 6   tacrolimus (PROGRAF) 1 MG capsule Take 2 capsules (2 mg total) by mouth every morning and 2 capsules (2 mg total) every evening. (Patient taking differently: Take 1-2 mg by mouth See admin instructions. Take 2 mg in the morning and 1 mg at bedtime) 360 capsule 3   triamcinolone cream (KENALOG) 0.1 % Apply 2 times a day to rash. Follow with CeraVe Cream.  Stop when smooth. (Patient taking differently: Apply 1 Application topically daily as needed (Rash).) 80 g 2   aspirin EC 81 MG tablet Take 81 mg by mouth daily. Swallow whole. (Patient not taking: Reported on 01/13/2022)     furosemide (LASIX) 20 MG tablet Take 1 tablet (20 mg total) by mouth daily. 90 tablet 3   metoprolol tartrate (LOPRESSOR) 50 MG tablet TAKE 1 TABLET BY MOUTH  2 TIMES DAILY 180 tablet 1   Facility-Administered Medications Prior to Visit  Medication Dose Route Frequency Provider Last Rate Last Admin   heparin lock flush 100 unit/mL  500 Units Intracatheter Daily PRN Truitt Merle, MD       sodium chloride flush (NS) 0.9 % injection 10 mL  10 mL Intracatheter PRN Truitt Merle, MD        Allergies  Allergen Reactions   Niacinamide Itching    Improved upon discontinuation    Thiazide-Type Diuretics Other (See Comments)    Chlorthalidone caused gout   Quinolones Other (See Comments)    CONTRAINDICATED D/T PT HAVING AAA    ROS Review of Systems  Constitutional:  Negative for appetite change, chills, fatigue and  fever.  HENT:  Negative for congestion, dental problem, ear pain and sore throat.   Eyes:  Negative for discharge, redness and visual disturbance.  Respiratory:  Negative for cough, chest tightness, shortness of breath and wheezing.   Cardiovascular:  Negative for chest pain, palpitations and leg swelling.  Gastrointestinal:  Negative for abdominal pain, blood in stool, diarrhea, nausea and vomiting.  Genitourinary:  Negative for difficulty urinating, dysuria, flank pain, frequency, hematuria and urgency.  Musculoskeletal:  Negative for arthralgias, back pain, joint swelling, myalgias and neck stiffness.  Skin:  Negative for pallor and rash.  Neurological:  Negative for dizziness, speech difficulty, weakness and headaches.  Hematological:  Negative for adenopathy. Does not bruise/bleed easily.  Psychiatric/Behavioral:  Negative for confusion and sleep disturbance. The patient is not nervous/anxious.     PE;    01/13/2022    8:33 AM 01/07/2022    8:25 AM 01/07/2022    8:15 AM  Vitals with BMI  Height 5' 9"     Weight 239 lbs 6 oz    BMI 70.01    Systolic [phone]  Diastolic 91 78 88  Pulse 84 67 72    Gen: Alert, well appearing.  Patient is oriented to person, place, time, and situation. AFFECT: pleasant, lucid thought and speech. ENT: Ears: EACs clear, normal epithelium.  TMs with good light reflex and landmarks bilaterally.  Eyes: no injection, icteris, swelling, or exudate.  EOMI, PERRLA. Nose: no drainage or turbinate edema/swelling.  No injection or focal lesion.  Mouth: lips without lesion/swelling.  Oral mucosa pink and moist.  Dentition intact and without obvious caries or gingival swelling.  Oropharynx without erythema, exudate, or swelling.  Neck: supple/nontender.  No LAD, mass, or TM.  Carotid pulses 2+ bilaterally, without bruits. CV: RRR, no m/r/g.   LUNGS: CTA bilat, nonlabored resps, good aeration in all lung fields. ABD: soft, NT, ND, BS normal.  No  hepatospenomegaly or mass.  No bruits. EXT: no clubbing, cyanosis, or edema.  Musculoskeletal: no joint swelling, erythema, warmth, or tenderness.  ROM of all joints intact. Skin - no sores or suspicious lesions or rashes or color changes  Pertinent labs:  Lab Results  Component Value Date   TSH 1.54 01/13/2021   Lab Results  Component Value Date   WBC 10.2 08/05/2021   HGB 15.6 01/07/2022   HCT 46.0 01/07/2022   MCV 84.2 07/14/2021   PLT 349 08/05/2021   Lab Results  Component Value Date   IRON 54 01/14/2021   TIBC 319.2 01/14/2021   FERRITIN 128.4 01/14/2021   Lab Results  Component Value Date   CREATININE 1.50 (H) 01/07/2022   BUN 12 01/07/2022   NA 136 01/07/2022   K 4.3 01/07/2022  CL 101 01/07/2022   CO2 21 11/15/2021   Lab Results  Component Value Date   ALT 21 11/15/2021   AST 16 11/15/2021   ALKPHOS 136 (A) 11/15/2021   BILITOT 0.8 10/29/2021   Lab Results  Component Value Date   CHOL 118 10/29/2021   Lab Results  Component Value Date   HDL 26 (L) 10/29/2021   Lab Results  Component Value Date   LDLCALC 65 10/29/2021   Lab Results  Component Value Date   TRIG 156 (H) 10/29/2021   Lab Results  Component Value Date   CHOLHDL 4.5 10/29/2021   Lab Results  Component Value Date   PSA 0.80 07/14/2021   PSA 1.06 03/23/2020   PSA 4.27 (H) 11/27/2019   Lab Results  Component Value Date   HGBA1C 6.3 (A) 01/13/2022   HGBA1C 6.3 01/13/2022   HGBA1C 6.3 01/13/2022   HGBA1C 6.3 01/13/2022   ASSESSMENT AND PLAN:   #1 health maintenance exam: Reviewed age and gender appropriate health maintenance issues (prudent diet, regular exercise, health risks of tobacco and excessive alcohol, use of seatbelts, fire alarms in home, use of sunscreen).  Also reviewed age and gender appropriate health screening as well as vaccine recommendations. Vaccines:  All utd. Labs: cbc, cmet, flp, Hba1c. Prostate ca screening: next PSA due after 07/2022. Colon ca  screening: UTD 01/07/22--->no further colonoscopies recommended (Per GI).  #2 diabetes with nephropathy. Hemoglobin A1c today is 6.3%. Feet exam normal. Urine microalbumin/creatinine today.  3.  Hypertension. Overall good control, but elevated here today and at recent colonoscopy. This summer at the cardiologist his amlodipine dose was decreased to one half of a 5 mg tab daily because his blood pressure was so good. Will keep him at this dose but have him check blood pressures daily at home for a while and send these to me. Electrolytes and creatinine today.  4.  Chronic renal insufficiency stage III. He avoids NSAIDs. Electrolytes and creatinine today.  #5 chronic nasal congestion.  I think he has some vasomotor rhinitis. Astelin prescribed today.  #6 mixed hyperlipidemia. Rosuvastatin 10 mg a day. Lipid panel and hepatic panel today.  An After Visit Summary was printed and given to the patient.  FOLLOW UP:  Return in about 4 months (around 05/15/2022) for routine chronic illness f/u.  Signed:  Crissie Sickles, MD           01/13/2022

## 2022-01-13 NOTE — Patient Instructions (Signed)
Health Maintenance, Male Adopting a healthy lifestyle and getting preventive care are important in promoting health and wellness. Ask your health care provider about: The right schedule for you to have regular tests and exams. Things you can do on your own to prevent diseases and keep yourself healthy. What should I know about diet, weight, and exercise? Eat a healthy diet  Eat a diet that includes plenty of vegetables, fruits, low-fat dairy products, and lean protein. Do not eat a lot of foods that are high in solid fats, added sugars, or sodium. Maintain a healthy weight Body mass index (BMI) is a measurement that can be used to identify possible weight problems. It estimates body fat based on height and weight. Your health care provider can help determine your BMI and help you achieve or maintain a healthy weight. Get regular exercise Get regular exercise. This is one of the most important things you can do for your health. Most adults should: Exercise for at least 150 minutes each week. The exercise should increase your heart rate and make you sweat (moderate-intensity exercise). Do strengthening exercises at least twice a week. This is in addition to the moderate-intensity exercise. Spend less time sitting. Even light physical activity can be beneficial. Watch cholesterol and blood lipids Have your blood tested for lipids and cholesterol at 72 years of age, then have this test every 5 years. You may need to have your cholesterol levels checked more often if: Your lipid or cholesterol levels are high. You are older than 72 years of age. You are at high risk for heart disease. What should I know about cancer screening? Many types of cancers can be detected early and may often be prevented. Depending on your health history and family history, you may need to have cancer screening at various ages. This may include screening for: Colorectal cancer. Prostate cancer. Skin cancer. Lung  cancer. What should I know about heart disease, diabetes, and high blood pressure? Blood pressure and heart disease High blood pressure causes heart disease and increases the risk of stroke. This is more likely to develop in people who have high blood pressure readings or are overweight. Talk with your health care provider about your target blood pressure readings. Have your blood pressure checked: Every 3-5 years if you are 18-39 years of age. Every year if you are 40 years old or older. If you are between the ages of 65 and 75 and are a current or former smoker, ask your health care provider if you should have a one-time screening for abdominal aortic aneurysm (AAA). Diabetes Have regular diabetes screenings. This checks your fasting blood sugar level. Have the screening done: Once every three years after age 45 if you are at a normal weight and have a low risk for diabetes. More often and at a younger age if you are overweight or have a high risk for diabetes. What should I know about preventing infection? Hepatitis B If you have a higher risk for hepatitis B, you should be screened for this virus. Talk with your health care provider to find out if you are at risk for hepatitis B infection. Hepatitis C Blood testing is recommended for: Everyone born from 1945 through 1965. Anyone with known risk factors for hepatitis C. Sexually transmitted infections (STIs) You should be screened each year for STIs, including gonorrhea and chlamydia, if: You are sexually active and are younger than 72 years of age. You are older than 72 years of age and your   health care provider tells you that you are at risk for this type of infection. Your sexual activity has changed since you were last screened, and you are at increased risk for chlamydia or gonorrhea. Ask your health care provider if you are at risk. Ask your health care provider about whether you are at high risk for HIV. Your health care provider  may recommend a prescription medicine to help prevent HIV infection. If you choose to take medicine to prevent HIV, you should first get tested for HIV. You should then be tested every 3 months for as long as you are taking the medicine. Follow these instructions at home: Alcohol use Do not drink alcohol if your health care provider tells you not to drink. If you drink alcohol: Limit how much you have to 0-2 drinks a day. Know how much alcohol is in your drink. In the [place] one drink equals one 12 oz bottle of beer (355 mL), one 5 oz glass of wine (148 mL), or one 1 oz glass of hard liquor (44 mL). Lifestyle Do not use any products that contain nicotine or tobacco. These products include cigarettes, chewing tobacco, and vaping devices, such as e-cigarettes. If you need help quitting, ask your health care provider. Do not use street drugs. Do not share needles. Ask your health care provider for help if you need support or information about quitting drugs. General instructions Schedule regular health, dental, and eye exams. Stay current with your vaccines. Tell your health care provider if: You often feel depressed. You have ever been abused or do not feel safe at home. Summary Adopting a healthy lifestyle and getting preventive care are important in promoting health and wellness. Follow your health care provider's instructions about healthy diet, exercising, and getting tested or screened for diseases. Follow your health care provider's instructions on monitoring your cholesterol and blood pressure. This information is not intended to replace advice given to you by your health care provider. Make sure you discuss any questions you have with your health care provider. Document Revised: 06/15/2020 Document Reviewed: 06/15/2020 Elsevier Patient Education  2023 Elsevier Inc.  

## 2022-01-17 ENCOUNTER — Other Ambulatory Visit (HOSPITAL_COMMUNITY): Payer: Self-pay

## 2022-01-18 ENCOUNTER — Telehealth: Payer: Self-pay

## 2022-01-18 DIAGNOSIS — D72829 Elevated white blood cell count, unspecified: Secondary | ICD-10-CM

## 2022-01-18 DIAGNOSIS — D75839 Thrombocytosis, unspecified: Secondary | ICD-10-CM

## 2022-01-18 DIAGNOSIS — D721 Eosinophilia, unspecified: Secondary | ICD-10-CM

## 2022-01-18 NOTE — Telephone Encounter (Signed)
-----   Message from Tammi Sou, MD sent at 01/17/2022  5:14 PM EST ----- All labs stable except white blood cell count is a little bit elevated. I know he has also had this abnormality on some recent labs done by his transplant clinic.  I would like him to see a blood specialist (hematologist). Please order referral to Dr. Marin Olp with hematology/oncology at [facility], diagnosis leukocytosis and eosinophilia and thrombocytosis.

## 2022-01-19 ENCOUNTER — Telehealth: Payer: Self-pay | Admitting: Hematology

## 2022-01-19 NOTE — Telephone Encounter (Signed)
Contacted patient to scheduled appointments. Patient is aware of appointments that are scheduled.

## 2022-01-20 ENCOUNTER — Encounter: Payer: Self-pay | Admitting: Hematology

## 2022-01-22 ENCOUNTER — Encounter: Payer: Self-pay | Admitting: Hematology

## 2022-01-24 ENCOUNTER — Other Ambulatory Visit (HOSPITAL_COMMUNITY): Payer: Self-pay

## 2022-01-27 ENCOUNTER — Encounter: Payer: Self-pay | Admitting: Family Medicine

## 2022-01-27 DIAGNOSIS — Z79899 Other long term (current) drug therapy: Secondary | ICD-10-CM | POA: Diagnosis not present

## 2022-01-27 DIAGNOSIS — I358 Other nonrheumatic aortic valve disorders: Secondary | ICD-10-CM | POA: Diagnosis not present

## 2022-01-27 DIAGNOSIS — J8410 Pulmonary fibrosis, unspecified: Secondary | ICD-10-CM | POA: Diagnosis not present

## 2022-01-27 DIAGNOSIS — Z942 Lung transplant status: Secondary | ICD-10-CM | POA: Diagnosis not present

## 2022-01-27 DIAGNOSIS — J849 Interstitial pulmonary disease, unspecified: Secondary | ICD-10-CM | POA: Diagnosis not present

## 2022-01-27 DIAGNOSIS — R942 Abnormal results of pulmonary function studies: Secondary | ICD-10-CM | POA: Diagnosis not present

## 2022-01-27 DIAGNOSIS — K7681 Hepatopulmonary syndrome: Secondary | ICD-10-CM | POA: Diagnosis not present

## 2022-01-31 NOTE — Telephone Encounter (Signed)
Increase amlodipine to TWO of the 42m tabs daily. Continue all other meds at current doses. O/v 10-14d to review bp's.

## 2022-02-02 NOTE — Progress Notes (Signed)
This encounter was created in error - please disregard.

## 2022-02-03 ENCOUNTER — Other Ambulatory Visit (HOSPITAL_BASED_OUTPATIENT_CLINIC_OR_DEPARTMENT_OTHER): Payer: Self-pay

## 2022-02-03 MED ORDER — COMIRNATY 30 MCG/0.3ML IM SUSY
PREFILLED_SYRINGE | INTRAMUSCULAR | 0 refills | Status: DC
  Filled 2022-02-03: qty 0.3, 1d supply, fill #0

## 2022-02-04 ENCOUNTER — Other Ambulatory Visit (HOSPITAL_BASED_OUTPATIENT_CLINIC_OR_DEPARTMENT_OTHER): Payer: Self-pay

## 2022-02-10 ENCOUNTER — Other Ambulatory Visit (HOSPITAL_COMMUNITY): Payer: Self-pay

## 2022-02-10 DIAGNOSIS — D2261 Melanocytic nevi of right upper limb, including shoulder: Secondary | ICD-10-CM | POA: Diagnosis not present

## 2022-02-10 DIAGNOSIS — D2260 Melanocytic nevi of unspecified upper limb, including shoulder: Secondary | ICD-10-CM | POA: Diagnosis not present

## 2022-02-10 DIAGNOSIS — L570 Actinic keratosis: Secondary | ICD-10-CM | POA: Diagnosis not present

## 2022-02-10 DIAGNOSIS — Z944 Liver transplant status: Secondary | ICD-10-CM | POA: Diagnosis not present

## 2022-02-10 DIAGNOSIS — D2270 Melanocytic nevi of unspecified lower limb, including hip: Secondary | ICD-10-CM | POA: Diagnosis not present

## 2022-02-10 DIAGNOSIS — D225 Melanocytic nevi of trunk: Secondary | ICD-10-CM | POA: Diagnosis not present

## 2022-02-10 DIAGNOSIS — Z5181 Encounter for therapeutic drug level monitoring: Secondary | ICD-10-CM | POA: Diagnosis not present

## 2022-02-10 DIAGNOSIS — L578 Other skin changes due to chronic exposure to nonionizing radiation: Secondary | ICD-10-CM | POA: Diagnosis not present

## 2022-02-10 DIAGNOSIS — D849 Immunodeficiency, unspecified: Secondary | ICD-10-CM | POA: Diagnosis not present

## 2022-02-10 DIAGNOSIS — Z85828 Personal history of other malignant neoplasm of skin: Secondary | ICD-10-CM | POA: Diagnosis not present

## 2022-02-10 DIAGNOSIS — L720 Epidermal cyst: Secondary | ICD-10-CM | POA: Diagnosis not present

## 2022-02-10 DIAGNOSIS — D492 Neoplasm of unspecified behavior of bone, soft tissue, and skin: Secondary | ICD-10-CM | POA: Diagnosis not present

## 2022-02-10 DIAGNOSIS — L814 Other melanin hyperpigmentation: Secondary | ICD-10-CM | POA: Diagnosis not present

## 2022-02-10 DIAGNOSIS — L821 Other seborrheic keratosis: Secondary | ICD-10-CM | POA: Diagnosis not present

## 2022-02-10 DIAGNOSIS — D485 Neoplasm of uncertain behavior of skin: Secondary | ICD-10-CM | POA: Diagnosis not present

## 2022-02-10 DIAGNOSIS — D2272 Melanocytic nevi of left lower limb, including hip: Secondary | ICD-10-CM | POA: Diagnosis not present

## 2022-02-10 DIAGNOSIS — L738 Other specified follicular disorders: Secondary | ICD-10-CM | POA: Diagnosis not present

## 2022-02-10 DIAGNOSIS — N1831 Chronic kidney disease, stage 3a: Secondary | ICD-10-CM | POA: Diagnosis not present

## 2022-02-10 MED ORDER — AMMONIUM LACTATE 12 % EX CREA
TOPICAL_CREAM | CUTANEOUS | 99 refills | Status: DC
  Filled 2022-02-10: qty 385, 30d supply, fill #0

## 2022-02-11 ENCOUNTER — Other Ambulatory Visit (HOSPITAL_COMMUNITY): Payer: Self-pay

## 2022-02-14 ENCOUNTER — Encounter: Payer: Self-pay | Admitting: Family Medicine

## 2022-02-14 ENCOUNTER — Other Ambulatory Visit (HOSPITAL_COMMUNITY): Payer: Self-pay

## 2022-02-14 ENCOUNTER — Ambulatory Visit (INDEPENDENT_AMBULATORY_CARE_PROVIDER_SITE_OTHER): Payer: Medicare Other | Admitting: Family Medicine

## 2022-02-14 VITALS — BP 139/78 | HR 85 | Temp 97.4°F | Ht 69.0 in | Wt 237.8 lb

## 2022-02-14 DIAGNOSIS — I10 Essential (primary) hypertension: Secondary | ICD-10-CM | POA: Diagnosis not present

## 2022-02-14 MED ORDER — FUROSEMIDE 20 MG PO TABS
20.0000 mg | ORAL_TABLET | Freq: Every day | ORAL | 3 refills | Status: DC | PRN
  Filled 2022-02-14: qty 90, 90d supply, fill #0

## 2022-02-14 MED ORDER — FUROSEMIDE 20 MG PO TABS: ORAL_TABLET | ORAL | 3 refills | Status: DC

## 2022-02-14 MED ORDER — METOPROLOL TARTRATE 50 MG PO TABS: ORAL_TABLET | ORAL | 3 refills | Status: DC

## 2022-02-14 NOTE — Progress Notes (Signed)
OFFICE VISIT  02/14/2022  CC: f/u HTN  Patient is a 72 y.o. male who presents accompanied by his wife [name] for 1 month follow-up hypertension. A/P as of last visit: "Hypertension. Overall good control, but elevated here today and at recent colonoscopy. This summer at the cardiologist his amlodipine dose was decreased to one half of a 5 mg tab daily because his blood pressure was so good. Will keep him at this dose but have him check blood pressures daily at home for a while and send these to me. Electrolytes and creatinine today."  INTERIM HX: Cadan is feeling well. I increased his amlodipine to 2 of the 5 mg tabs daily on 01/27/2022.  He also takes 50 mg Lopressor twice a day. Blood pressures continue to be elevated at home: 408-144 systolic over 81-85 diastolic.  He does not know what his heart rates have been.  No significant change in lower extremity edema. He continues to take Lasix 20 mg daily long-term.  Past Medical History:  Diagnosis Date   AAA (abdominal aortic aneurysm) ([place]) 03/2014   repaired 2020   Ascending aortic aneurysm (HCC)    unrepaired. Stable at 4.3 cm on imaging by [facility] thoracic surg 10/2019 and 10/2020.   Bilateral renal cysts    simple (03/2014 MRI)   CAD (coronary artery disease)    Cholelithiases 2018   asymptomatic   Chronic diastolic heart failure (HCC)    Chronic renal insufficiency, stage 3 (moderate) ([place]) 2022   baseline sCr 1.3-1.5 (GFR 55)   COVID-19 virus infection 02/2020   sotrovimab infusion   Diabetes mellitus with complication ([place]) 63/1497   A1c 6.8%. A1c 5.4% 11/2019   Diverticulosis    Gout    initially dx'd by diag arthrocentesis of elbow effusion in winter 2021, 3 episodes, all came after being started on chlorthalidone->I d/c'd chlorthal.   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  Improving + off oxygen as of 3 mo s/p liver transplant.  Doing well/resolved as of 02/2020 [facility] pulm f/u->they'll continue to follow.   History  of blood transfusion 2018 X 4 dates   Chronic GI blood loss of unknown location despite full GI endoscopic eval, etc   History of liver transplant ([place]) 07/2019   [facility]   Hyperlipemia, mixed    elevated LFTs when on statins.     Hypertension    Cr bump 04/01/16 so I changed benicar-hct to benicar plain and added amlodipine 5 mg.   Increased prostate specific antigen (PSA) velocity 2021   0.11 to 4.77 from 2020 to 2021 (he got a liver transplant and was put on antirejection meds between these psa checks).   Iron deficiency anemia 05/2016   Acute blood loss anemia: hospitalized, required transfusion x 3 U: colonoscopy and capsule study unrevealing.  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  W/u for ? hemolytic anemia to be pursued by hematologist as outpt.  Dr. [name], GI at [facility] following, too---he rec'd onc do bone marrow bx as of Jan 2019   Iron deficiency anemia due to chronic blood loss 2018/19   GI: transfusions x >20 required; multiple endoscopies and bleeding scans unrevealing. Lysteda and octreotide + monthly iron infusions as of 04/2017. Iron infusions changed to every other month as of 09/2018 hem f/u.   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  Changes noted on all CXRs in 2018/2019.  Liver Transplant eval 03/2018->mild restriction on PFTs but no obstruction.  See further details in PMH "pulm fibrosis" section   Microscopic hematuria    Eval unremarkable by Dr. Eulogio Ditch.   Nonmelanoma skin cancer 08/15/2018   2020 BCC nose, excised.  2022 R side of neck->mohs   Obesity    Pulmonary fibrosis (HCC)    PFTs: restrictive lung dz ([facility]  transplant eval 05/2018). [facility] pulm eval felt this was likely hepatopulmonary syndrome->causing his hypoxia->low likelihood of progression (as of 10/04/18 transplant clinic f/u). Pulm rehab helping as of 2020/2021. OFF oxygen as of 10/2019 transp f/u.   Spleen enlarged     Past Surgical History:  Procedure Laterality Date   ABDOMINAL AORTIC ANEURYSM REPAIR  08/23/2018   [facility]   CARDIAC CATHETERIZATION     CARDIOVASCULAR STRESS TEST  01/17/2012; 05/04/16   Normal stress nuclear study x 2 (2018--Normal perfusion. LVEF 66% with normal wall motion. This is a low risk study).   CERVICAL DISCECTOMY  1992   COLONOSCOPY N/A 05/27/2016   No site/explanation for blood loss found.  Erythematous mucosa in cecum and ascending colon--Cecal bx: normal.  Procedure: COLONOSCOPY;  Surgeon: Carol Ada, MD;  Location: [facility] ENDOSCOPY;  Service: Endoscopy;  Laterality: N/A;   COLONOSCOPY     01/07/22 diverticulosis but NO POLYPS.  No further recommended   COLONOSCOPY W/ POLYPECTOMY  09/10/2014   Polypectomy x 2: recall 5 yrs (Dr. [name]).   COLONOSCOPY WITH PROPOFOL N/A 01/07/2022   Procedure: COLONOSCOPY WITH PROPOFOL;  Surgeon: Carol Ada, MD;  Location: [facility] ENDOSCOPY;  Service: Gastroenterology;  Laterality: N/A;   CORONARY ANGIOPLASTY     CORONARY ARTERY BYPASS GRAFT  03/2006   CT ABDOMEN WITH CONTRAST ([place] HX)  11/2018   CT CHEST WWO CONTAST ([place] HX)  07/11/2019   No significant change in interstitial lung disease with possible underlying hepatopulmonary syndrome, Increased ascites in the setting of cirrhosis, tiny new right basilar nodule   DEXA  03/07/2019   T score 0.2/NORMAL   ENTEROSCOPY N/A 07/31/2016   Procedure: ENTEROSCOPY;  Surgeon: Ladene Artist, MD;  Location: [facility] ENDOSCOPY;  Service: Endoscopy;  Laterality: N/A;   ENTEROSCOPY N/A 12/02/2016   Procedure: ENTEROSCOPY;  Surgeon: Carol Ada, MD;  Location: [facility] ENDOSCOPY;  Service: Endoscopy;  Laterality: N/A;   ESOPHAGOGASTRODUODENOSCOPY N/A  05/25/2016   No site/explanation for blood loss found.  Procedure: ESOPHAGOGASTRODUODENOSCOPY (EGD);  Surgeon: Juanita Craver, MD;  Location: [facility] ENDOSCOPY;  Service: Endoscopy;  Laterality: N/A;   ESOPHAGOGASTRODUODENOSCOPY N/A 09/23/2016   Procedure: ESOPHAGOGASTRODUODENOSCOPY (EGD);  Surgeon: Carol Ada, MD;  Location: [place];  Service: Endoscopy;  Laterality: N/A;   ESOPHAGOGASTRODUODENOSCOPY (EGD) WITH PROPOFOL N/A 08/21/2018   Procedure: ESOPHAGOGASTRODUODENOSCOPY (EGD) WITH PROPOFOL;  Surgeon: Carol Ada, MD;  Location: [facility] ENDOSCOPY;  Service: Endoscopy;  Laterality: N/A;   GIVENS CAPSULE STUDY N/A 05/27/2016   No source identified.  Repeat 10/2016--results pending.  Procedure: GIVENS CAPSULE STUDY;  Surgeon: Carol Ada, MD;  Location: [facility] ENDOSCOPY;  Service: Endoscopy;  Laterality: N/A;   GIVENS CAPSULE STUDY N/A 10/15/2016   Procedure: GIVENS CAPSULE STUDY;  Surgeon: Carol Ada, MD;  Location: [facility] ENDOSCOPY;  Service: Endoscopy;  Laterality: N/A;   GIVENS CAPSULE STUDY N/A 02/13/2017   Procedure: GIVENS CAPSULE STUDY;  Surgeon:  Carol Ada, MD;  Location: [place];  Service: Endoscopy;  Laterality: N/A;   LIVER TRANSPLANT  08/02/2019   For NASH cirrhosis. [place]   NM GI BLOOD LOSS  01/27/2017   NEGATIVE   SMALL BOWEL ENTEROSCOPY  10/2016   ([facility], Dr. [name]: Double balloon enteroscopy--to the level of proximal ileum) jejunal polyps- which were removed--not bleeding & benign.  No source of bleeding was identified.   TRANSTHORACIC ECHOCARDIOGRAM  05/25/2016; 10/2017   05/2016: EF 60%, normal wall motion, grd II DD, mild aortic stenosis, dilated aortic root and ascending aorta. 10/2017: Mild LVH; no mention made of LV function.  Moderate aorticstenosis with mean gradient 19 peak gradient 41. AVA 1.1 cm^2.  05/2018 EF 55%,4.3 cm ascend aneur, mild AS. 10/2020 EF 60-65%, normal diast fxn, mod AV sclerosis w/out sten/regurg.   VASECTOMY  1977    Outpatient  Medications Prior to Visit  Medication Sig Dispense Refill   amLODipine (NORVASC) 5 MG tablet Take 1 tablet (5 mg total) by mouth daily. (Patient taking differently: Take 10 mg by mouth daily.) 90 tablet 0   ammonium lactate (AMLACTIN) 12 % cream Apply 2 times daily to forearms and hands in preparation for treatment with Efudex (fluorouracil) 385 g 99   aspirin EC 81 MG tablet Take 81 mg by mouth daily. Swallow whole.     azelastine (ASTELIN) 0.1 % nasal spray Place 2 sprays into both nostrils 2 (two) times daily 30 mL 12   fluorouracil (EFUDEX) 5 % cream Apply topically 2 (two) times daily.     Magnesium 400 MG TABS Take 400 mg by mouth daily.     mycophenolate (CELLCEPT) 250 MG capsule Take 2 capsules by mouth  in the morning and 1 capsule in the evening (Patient taking differently: Take by mouth. 1 capsule by mouth in the morning and 1 capsule in the evening.) 270 capsule 3   pantoprazole (PROTONIX) 40 MG tablet Take 1 tablet (40 mg total) by mouth once daily 90 tablet 3   rosuvastatin (CRESTOR) 10 MG tablet Take 1 tablet (10 mg total) by mouth daily. (Patient taking differently: Take 10 mg by mouth at bedtime.) 90 tablet 3   sodium zirconium cyclosilicate (LOKELMA) 10 g PACK packet Take 10 g (1 packet total) by mouth every Monday, Wednesday and Friday. Space 2 hours apart from Tacrolimus. 12 packet 6   tacrolimus (PROGRAF) 1 MG capsule Take 2 capsules (2 mg total) by mouth every morning and 2 capsules (2 mg total) every evening. (Patient taking differently: Take 1-2 mg by mouth See admin instructions. Take 2 mg in the morning and 1 mg at bedtime) 360 capsule 3   triamcinolone cream (KENALOG) 0.1 % Apply 2 times a day to rash. Follow with CeraVe Cream.  Stop when smooth. (Patient taking differently: Apply 1 Application topically daily as needed (Rash).) 80 g 2   furosemide (LASIX) 20 MG tablet Take 1 tablet (20 mg total) by mouth daily. PRN 90 tablet 3   metoprolol tartrate (LOPRESSOR) 50 MG tablet  TAKE 1 TABLET BY MOUTH 2 TIMES DAILY 180 tablet 1   COVID-19 mRNA vaccine 2023-2024 (COMIRNATY) syringe Inject into the muscle. 0.3 mL 0   metoprolol tartrate (LOPRESSOR) 50 MG tablet TAKE 1 TABLET BY MOUTH 2 TIMES DAILY (Patient not taking: Reported on 02/14/2022) 180 tablet 3   Facility-Administered Medications Prior to Visit  Medication Dose Route Frequency Provider Last Rate Last Admin   heparin lock flush 100  unit/mL  500 Units Intracatheter Daily PRN Truitt Merle, MD       sodium chloride flush (NS) 0.9 % injection 10 mL  10 mL Intracatheter PRN Truitt Merle, MD        Allergies  Allergen Reactions   Niacinamide Itching    Improved upon discontinuation    Thiazide-Type Diuretics Other (See Comments)    Chlorthalidone caused gout   Quinolones Other (See Comments)    CONTRAINDICATED D/T PT HAVING AAA    Review of Systems As per HPI  PE:    02/14/2022    1:06 PM 01/13/2022    8:33 AM 01/07/2022    8:25 AM  Vitals with BMI  Height '5\' 9"'$  '5\' 9"'$    Weight 237 lbs 13 oz 239 lbs 6 oz   BMI 02.4 09.73   Systolic 532 992 426  Diastolic 78 91 78  Pulse 85 84 67   Physical Exam  Gen: Alert, well appearing.  Patient is oriented to person, place, time, and situation. AFFECT: pleasant, lucid thought and speech. CV: RRR, 2-3/6 blowing syst murmur Lungs: Soft inspiratory crackles in bases, right greater than left.  Nonlabored respirations.  Good aeration.  No wheezing. EXT: no clubbing or cyanosis.  1+ pitting edema right lower leg, 2+ pitting edema left lower leg.    LABS:  Last metabolic panel Lab Results  Component Value Date   GLUCOSE 150 (H) 01/13/2022   NA 138 01/13/2022   K 5.0 01/13/2022   CL 99 01/13/2022   CO2 32 01/13/2022   BUN 12 01/13/2022   CREATININE 1.30 01/13/2022   EGFR 47 11/15/2021   CALCIUM 10.0 01/13/2022   PHOS 2.9 07/29/2016   PROT 7.4 01/13/2022   ALBUMIN 4.8 01/13/2022   LABGLOB 2.4 10/29/2021   AGRATIO 2.0 10/29/2021   BILITOT 0.9 01/13/2022    ALKPHOS 123 (H) 01/13/2022   AST 14 01/13/2022   ALT 15 01/13/2022   ANIONGAP 9 05/02/2019   IMPRESSION AND PLAN:  Uncontrolled hypertension. Increase Lopressor to 1-1/2 of the 50 mg tabs twice a day.  Continue amlodipine 10 mg a day. Of note, mineralocorticoid receptor antagonist contraindicated due to his history of hyperkalemia.  An After Visit Summary was printed and given to the patient.  FOLLOW UP: Return in about 2 weeks (around 02/28/2022) for f/u HTN. Next CPE 01/2023.  Signed:  Crissie Sickles, MD           02/14/2022

## 2022-02-14 NOTE — Patient Instructions (Signed)
Increase your metoprolol to 1 and 1/2 tabs twice a day

## 2022-02-17 ENCOUNTER — Other Ambulatory Visit: Payer: Self-pay

## 2022-02-23 ENCOUNTER — Other Ambulatory Visit (HOSPITAL_COMMUNITY): Payer: Self-pay

## 2022-02-23 DIAGNOSIS — Z944 Liver transplant status: Secondary | ICD-10-CM | POA: Diagnosis not present

## 2022-02-23 DIAGNOSIS — D849 Immunodeficiency, unspecified: Secondary | ICD-10-CM | POA: Diagnosis not present

## 2022-02-23 DIAGNOSIS — Z9482 Intestine transplant status: Secondary | ICD-10-CM | POA: Diagnosis not present

## 2022-02-25 ENCOUNTER — Inpatient Hospital Stay: Payer: Medicare Other

## 2022-02-25 ENCOUNTER — Inpatient Hospital Stay: Payer: Medicare Other | Attending: Hematology | Admitting: Hematology

## 2022-02-25 VITALS — BP 149/90 | HR 71 | Temp 97.9°F | Resp 20 | Wt 238.8 lb

## 2022-02-25 DIAGNOSIS — Z8616 Personal history of COVID-19: Secondary | ICD-10-CM | POA: Diagnosis not present

## 2022-02-25 DIAGNOSIS — D729 Disorder of white blood cells, unspecified: Secondary | ICD-10-CM | POA: Diagnosis not present

## 2022-02-25 DIAGNOSIS — Z944 Liver transplant status: Secondary | ICD-10-CM | POA: Diagnosis not present

## 2022-02-25 DIAGNOSIS — K7581 Nonalcoholic steatohepatitis (NASH): Secondary | ICD-10-CM | POA: Insufficient documentation

## 2022-02-25 DIAGNOSIS — K7460 Unspecified cirrhosis of liver: Secondary | ICD-10-CM | POA: Insufficient documentation

## 2022-02-25 DIAGNOSIS — Z87891 Personal history of nicotine dependence: Secondary | ICD-10-CM | POA: Diagnosis not present

## 2022-02-25 DIAGNOSIS — Z862 Personal history of diseases of the blood and blood-forming organs and certain disorders involving the immune mechanism: Secondary | ICD-10-CM | POA: Diagnosis not present

## 2022-02-25 DIAGNOSIS — D72829 Elevated white blood cell count, unspecified: Secondary | ICD-10-CM | POA: Insufficient documentation

## 2022-02-25 DIAGNOSIS — D721 Eosinophilia, unspecified: Secondary | ICD-10-CM

## 2022-02-25 DIAGNOSIS — D75839 Thrombocytosis, unspecified: Secondary | ICD-10-CM | POA: Diagnosis not present

## 2022-02-25 DIAGNOSIS — D72828 Other elevated white blood cell count: Secondary | ICD-10-CM

## 2022-02-25 LAB — CMP (CANCER CENTER ONLY)
ALT: 18 U/L (ref 0–44)
AST: 15 U/L (ref 15–41)
Albumin: 4.3 g/dL (ref 3.5–5.0)
Alkaline Phosphatase: 119 U/L (ref 38–126)
Anion gap: 7 (ref 5–15)
BUN: 11 mg/dL (ref 8–23)
CO2: 28 mmol/L (ref 22–32)
Calcium: 9.7 mg/dL (ref 8.9–10.3)
Chloride: 101 mmol/L (ref 98–111)
Creatinine: 1.24 mg/dL (ref 0.61–1.24)
GFR, Estimated: >60 mL/min (ref >60)
Glucose, Bld: 149 mg/dL — ABNORMAL HIGH (ref 70–99)
Potassium: 4.2 mmol/L (ref 3.5–5.1)
Sodium: 136 mmol/L (ref 135–145)
Total Bilirubin: 1 mg/dL (ref 0.3–1.2)
Total Protein: 7.2 g/dL (ref 6.5–8.1)

## 2022-02-25 LAB — IRON AND IRON BINDING CAPACITY (CC-WL,HP ONLY)
Iron: 57 ug/dL (ref 45–182)
Saturation Ratios: 16 % — ABNORMAL LOW (ref 17.9–39.5)
TIBC: 353 ug/dL (ref 250–450)
UIBC: 296 ug/dL (ref 117–376)

## 2022-02-25 LAB — VITAMIN B12: Vitamin B-12: 227 pg/mL (ref 180–914)

## 2022-02-25 LAB — SEDIMENTATION RATE: Sed Rate: 19 mm/hr — ABNORMAL HIGH (ref 0–16)

## 2022-02-25 LAB — CBC WITH DIFFERENTIAL (CANCER CENTER ONLY)
Abs Immature Granulocytes: 0.19 10*3/uL — ABNORMAL HIGH (ref 0.00–0.07)
Basophils Absolute: 0.1 10*3/uL (ref 0.0–0.1)
Basophils Relative: 1 %
Eosinophils Absolute: 1.4 10*3/uL — ABNORMAL HIGH (ref 0.0–0.5)
Eosinophils Relative: 12 %
HCT: 43.5 % (ref 39.0–52.0)
Hemoglobin: 14.8 g/dL (ref 13.0–17.0)
Immature Granulocytes: 2 %
Lymphocytes Relative: 11 %
Lymphs Abs: 1.3 10*3/uL (ref 0.7–4.0)
MCH: 28.7 pg (ref 26.0–34.0)
MCHC: 34 g/dL (ref 30.0–36.0)
MCV: 84.3 fL (ref 80.0–100.0)
Monocytes Absolute: 1 10*3/uL (ref 0.1–1.0)
Monocytes Relative: 9 %
Neutro Abs: 8 10*3/uL — ABNORMAL HIGH (ref 1.7–7.7)
Neutrophils Relative %: 65 %
Platelet Count: 345 10*3/uL (ref 150–400)
RBC: 5.16 MIL/uL (ref 4.22–5.81)
RDW: 16.3 % — ABNORMAL HIGH (ref 11.5–15.5)
WBC Count: 12 10*3/uL — ABNORMAL HIGH (ref 4.0–10.5)
nRBC: 0 % (ref 0.0–0.2)

## 2022-02-25 LAB — C-REACTIVE PROTEIN: CRP: 1.6 mg/dL — ABNORMAL HIGH (ref <1.0)

## 2022-02-25 LAB — FERRITIN: Ferritin: 64 ng/mL (ref 24–336)

## 2022-02-25 NOTE — Progress Notes (Signed)
HEMATOLOGY/ONCOLOGY CLINIC NOTE  Date of Service: 02/25/22  Patient Care Team: Tammi Sou, MD as PCP - General (Family Medicine) Troy Sine, MD as PCP - Cardiology (Cardiology) Franchot Gallo, MD as Consulting Physician (Urology) Troy Sine, MD as Consulting Physician (Cardiology) Brunetta Genera, MD as Consulting Physician (Hematology) [name], Gaspar Skeeters, MD as Consulting Physician (Gastroenterology) Carol Ada, MD as Consulting Physician (Gastroenterology) Rege, Dwain Sarna, MD as Consulting Physician (General Surgery) Lovenia Shuck, MD as Consulting Physician (Cardiothoracic Surgery) Blazing, Venia Carbon, MD as Consulting Physician (Cardiology) Teena Irani, MD as Consulting Physician (Internal Medicine) Dorna Leitz, MD as Consulting Physician (Orthopedic Surgery) Tamsen Meek, MD as Consulting Physician (Dermatology)  CHIEF COMPLAINTS/PURPOSE OF CONSULTATION:   leukocytosis  HISTORY OF PRESENTING ILLNESS:   William [name] is a wonderful 72 y.o. male who has been referred to [place] by Dr .Anitra Lauth, Adrian [place], MD for evaluation and management of Anemia.  Patient has a history of extensive medical comorbidities including liver cirrhosis related to [facility] with portal hypertension, esophageal varices and portal hypertensive gastropathy and splenomegaly, iron deficiency anemia, obesity, diabetes, AAA.  Patient was admitted in April 2018 with acute blood loss anemia and required transfusion of multiple units of PRBCs with iron profile suggestive of iron deficiency. No overt evidence of hemolysis noted. He had an EGD and colonoscopy that did not show any overt bleeding. Capsule endoscopy was unrevealing but the capsule past and only 27 minutes.  He follows with Dr. [name] who is his gastric oncologist.  Patient was again readmitted in June 2018 with symptomatically anemia and hemoglobin of 6.3. He underwent 4 units of PRBC transfusions again and  was sent out on PPI and iron supplementation with the plan to follow-up with Dr. [name].  He was also given hematology referral. He had a repeat ultrasound of the abdomen on 07/29/2016 which showed significant increase in splenomegaly in 2 months suggesting significant portal hypertension versus some element of splenic sequestration.  He continues to note intermittent black stools.  Hemoglobin is stable and improved today and is up to 11.1. Patient notes he feels better after his transfusions. Has not noted lower GI bleeding at this time. We discuss any other workup to rule out less likely other possibilities of his anemia.  INTERVAL HISTORY  William [name] is here for a scheduled follow-up of his anemia that is primarily related to ongoing issues with GI bleeding and has not been controllable by multiple GI interventions.   He was last seen by me on 06/27/19 and reported occasional dark blood in the stool. Otherwise, he reported no new medical concerns.  Today, he is accompanied by his wife. He reports that he has had an elevated WBC since September/ October 2023. His wife notes that it has resulted at 12.5 K for the last month.   He reports that his liver transplant had improved his SOB immediately. He reports feeling the best he's ever felt in years. His liver transplant is monitored every 6 months.  He's had a Covid-19 infection a couple of times, his last infection in September 2023. He has received medication for this infection and denies having many symptoms. No sudden weight loss or weight changes. No new medication besides his transplant medications. No unexpected, fever, night sweats, or chills. No prostate issues besides having slight elevation a few years ago. No abdominal pain or back pain.   He reports that the cyst on his back is stable and unchanged from  baseline. He does not come in contact with any farm animals or pets. He has not traveled outside of the [place].   He reports that  he's had several lesions in his head removed in May. He's also  had a skin lesion removed on his neck in October/November. He used humex cream previously, but hasn't used it in a while. He's also previously used Nectine lotion on his arms.  His wife reports that he has some difficulty hearing. She also reveals that he had an elevated potassium level 2 months ago. 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 He has used a nasal spray for the past 6 months for nasal allergies. He reports some improvement in symptoms. He is compliant with his Cellcept medication. He reports that his dichromasy levels were low recently. No concern for adjusting medications. He has his blood drawn every 2 weeks at [place].   He denies any tooth pain, bone pain, urinary symptoms, or skin rashes. No recent drug allergies, although he notes a previous drug allergy reaction one year ago.    MEDICAL HISTORY:  Past Medical History:  Diagnosis Date   AAA (abdominal aortic aneurysm) ([place]) 03/2014   repaired 2020   Ascending aortic aneurysm (HCC)    unrepaired. Stable at 4.3 cm on imaging by [facility] thoracic surg 10/2019 and 10/2020.   Bilateral renal cysts    simple (03/2014 MRI)   CAD (coronary artery disease)    Cholelithiases 2018   asymptomatic   Chronic diastolic heart failure (HCC)    Chronic renal insufficiency, stage 3 (moderate) ([place]) 2022   baseline sCr 1.3-1.5 (GFR 55)   COVID-19 virus infection 02/2020   sotrovimab infusion   Diabetes mellitus with complication ([place]) 28/6381   A1c 6.8%. A1c 5.4% 11/2019   Diverticulosis    Gout    initially dx'd by diag arthrocentesis of elbow effusion in winter 2021, 3 episodes, all came after being started on chlorthalidone->I d/c'd chlorthal.   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  Improving + off oxygen as of 3 mo s/p liver transplant.  Doing well/resolved as of 02/2020 [facility] pulm f/u->they'll continue to follow.   History of blood transfusion 2018 X 4 dates    Chronic GI blood loss of unknown location despite full GI endoscopic eval, etc   History of liver transplant ([place]) 07/2019   [facility]   Hyperlipemia, mixed    elevated LFTs when on statins.     Hypertension    Cr bump 04/01/16 so I changed benicar-hct to benicar plain and added amlodipine 5 mg.   Increased prostate specific antigen (PSA) velocity 2021   0.11 to 4.77 from 2020 to 2021 (he got a liver transplant and was put on antirejection meds between these psa checks).   Iron deficiency anemia 05/2016   Acute blood loss anemia: hospitalized, required transfusion x 3 U: colonoscopy and capsule study unrevealing.  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  W/u for ? hemolytic anemia to be pursued by hematologist as outpt.  Dr. [name], GI at [facility] following, too---he rec'd onc do bone marrow bx as of Jan 2019   Iron deficiency anemia due to chronic blood loss 2018/19   GI: transfusions x >20 required; multiple endoscopies and bleeding scans unrevealing. Lysteda and octreotide + monthly iron infusions as of 04/2017. Iron infusions changed to every other month as of 09/2018 hem f/u.   Leukocytosis  hem/onc ref 01/2022   Liver cirrhosis secondary to NASH ([place]) 05/2016   Liver transplant 07/2019   Liver failure ([place]) 2020   NASH cirrhosis   Lung field abnormal finding on examination    Bibasilar L>R mild insp crackles-->x-ray showed mild interstitial changes/fibrosis/scarring.  Changes noted on all CXRs in 2018/2019.  Liver Transplant eval 03/2018->mild restriction on PFTs but no obstruction.  See further details in PMH "pulm fibrosis" section   Microscopic hematuria    Eval unremarkable by Dr. Eulogio Ditch.   Nonmelanoma skin cancer 08/15/2018   2020 BCC nose, excised.  2022 R side of neck->mohs   Obesity    Pulmonary fibrosis (HCC)    PFTs: restrictive lung dz ([facility] Liver transplant eval 05/2018). [facility] pulm eval felt  this was likely hepatopulmonary syndrome->causing his hypoxia->low likelihood of progression (as of 10/04/18 transplant clinic f/u). Pulm rehab helping as of 2020/2021. OFF oxygen as of 10/2019 transp f/u.   Spleen enlarged     SURGICAL HISTORY: Past Surgical History:  Procedure Laterality Date   ABDOMINAL AORTIC ANEURYSM REPAIR  08/23/2018   [facility]   CARDIAC CATHETERIZATION     CARDIOVASCULAR STRESS TEST  01/17/2012; 05/04/16   Normal stress nuclear study x 2 (2018--Normal perfusion. LVEF 66% with normal wall motion. This is a low risk study).   CERVICAL DISCECTOMY  1992   COLONOSCOPY N/A 05/27/2016   No site/explanation for blood loss found.  Erythematous mucosa in cecum and ascending colon--Cecal bx: normal.  Procedure: COLONOSCOPY;  Surgeon: Carol Ada, MD;  Location: [facility] ENDOSCOPY;  Service: Endoscopy;  Laterality: N/A;   COLONOSCOPY     01/07/22 diverticulosis but NO POLYPS.  No further recommended   COLONOSCOPY W/ POLYPECTOMY  09/10/2014   Polypectomy x 2: recall 5 yrs (Dr. [name]).   COLONOSCOPY WITH PROPOFOL N/A 01/07/2022   Procedure: COLONOSCOPY WITH PROPOFOL;  Surgeon: Carol Ada, MD;  Location: [facility] ENDOSCOPY;  Service: Gastroenterology;  Laterality: N/A;   CORONARY ANGIOPLASTY     CORONARY ARTERY BYPASS GRAFT  03/2006   CT ABDOMEN WITH CONTRAST ([place] HX)  11/2018   CT CHEST WWO CONTAST ([place] HX)  07/11/2019   No significant change in interstitial lung disease with possible underlying hepatopulmonary syndrome, Increased ascites in the setting of cirrhosis, tiny new right basilar nodule   DEXA  03/07/2019   T score 0.2/NORMAL   ENTEROSCOPY N/A 07/31/2016   Procedure: ENTEROSCOPY;  Surgeon: Ladene Artist, MD;  Location: [facility] ENDOSCOPY;  Service: Endoscopy;  Laterality: N/A;   ENTEROSCOPY N/A 12/02/2016   Procedure: ENTEROSCOPY;  Surgeon: Carol Ada, MD;  Location: [facility] ENDOSCOPY;  Service: Endoscopy;  Laterality: N/A;   ESOPHAGOGASTRODUODENOSCOPY N/A 05/25/2016   No  site/explanation for blood loss found.  Procedure: ESOPHAGOGASTRODUODENOSCOPY (EGD);  Surgeon: Juanita Craver, MD;  Location: [facility] ENDOSCOPY;  Service: Endoscopy;  Laterality: N/A;   ESOPHAGOGASTRODUODENOSCOPY N/A 09/23/2016   Procedure: ESOPHAGOGASTRODUODENOSCOPY (EGD);  Surgeon: Carol Ada, MD;  Location: [place];  Service: Endoscopy;  Laterality: N/A;   ESOPHAGOGASTRODUODENOSCOPY (EGD) WITH PROPOFOL N/A 08/21/2018   Procedure: ESOPHAGOGASTRODUODENOSCOPY (EGD) WITH PROPOFOL;  Surgeon: Carol Ada, MD;  Location: [facility] ENDOSCOPY;  Service: Endoscopy;  Laterality: N/A;   GIVENS CAPSULE STUDY N/A 05/27/2016   No source identified.  Repeat 10/2016--results pending.  Procedure: GIVENS CAPSULE STUDY;  Surgeon: Carol Ada, MD;  Location: [facility] ENDOSCOPY;  Service: Endoscopy;  Laterality: N/A;   GIVENS CAPSULE STUDY N/A 10/15/2016   Procedure: GIVENS CAPSULE STUDY;  Surgeon: Carol Ada, MD;  Location: [facility] ENDOSCOPY;  Service: Endoscopy;  Laterality: N/A;   GIVENS CAPSULE STUDY N/A 02/13/2017   Procedure: GIVENS CAPSULE STUDY;  Surgeon: Carol Ada, MD;  Location: [place];  Service: Endoscopy;  Laterality: N/A;   LIVER TRANSPLANT  08/02/2019   For NASH cirrhosis. [place]   NM GI BLOOD LOSS  01/27/2017   NEGATIVE   SMALL BOWEL ENTEROSCOPY  10/2016   ([facility], Dr. [name]: Double balloon enteroscopy--to the level of proximal ileum) jejunal polyps- which were removed--not bleeding & benign.  No source of bleeding was identified.   TRANSTHORACIC ECHOCARDIOGRAM  05/25/2016; 10/2017   05/2016: EF 60%, normal wall motion, grd II DD, mild aortic stenosis, dilated aortic root and ascending aorta. 10/2017: Mild LVH; no mention made of LV function.  Moderate aorticstenosis with mean gradient 19 peak gradient 41. AVA 1.1 cm^2.  05/2018 EF 55%,4.3 cm ascend aneur, mild AS. 10/2020 EF 60-65%, normal diast fxn, mod AV sclerosis w/out sten/regurg.   VASECTOMY  1977    SOCIAL HISTORY: Social History    Socioeconomic History   Marital status: Married    Spouse name: Not on file   Number of children: Not on file   Years of education: Not on file   Highest education level: 10th grade  Occupational History   Not on file  Tobacco Use   Smoking status: Former    Types: Cigarettes   Smokeless tobacco: Never   Tobacco comments:    QUIT I 1983  Vaping Use   Vaping Use: Never used  Substance and Sexual Activity   Alcohol use: No   Drug use: No   Sexual activity: Yes  Other Topics Concern   Not on file  Social History Narrative   Married, 3 grown children, 3 GCs.   Educ: 10th grade.   Occupation: Retired Brewing technologist.   Tob: quit 1983, smoked about 20 pack-yr hx prior.   No alcohol.   Social Determinants of Health   Financial Resource Strain: Low Risk  (07/21/2021)   Overall Financial Resource Strain (CARDIA)    Difficulty of Paying Living Expenses: Not hard at all  Food Insecurity: No Food Insecurity (07/21/2021)   Hunger Vital Sign    Worried About Running Out of Food in the Last Year: Never true    Ran Out of Food in the Last Year: Never true  Transportation Needs: No Transportation Needs (07/21/2021)   PRAPARE - Hydrologist (Medical): No    Lack of Transportation (Non-Medical): No  Physical Activity: Inactive (07/21/2021)   Exercise Vital Sign    Days of Exercise per Week: 0 days    Minutes of Exercise per Session: 0 min  Stress: No Stress Concern Present (07/21/2021)   [place]    Feeling of Stress : Not at all  Social Connections: Moderately Integrated (07/21/2021)   Social Connection and Isolation Panel [NHANES]    Frequency of Communication with Friends and Family: More than three times a week    Frequency of Social Gatherings with Friends and Family: More than three times a week    Attends Religious Services: 1 to 4 times per year    Active Member of [employer] or  Organizations: No    Attends Archivist Meetings: Never    Marital Status: Married  Human resources officer Violence: Not At Risk (07/21/2021)   Humiliation, Afraid, Rape, and Kick questionnaire    Fear of Current or Ex-Partner: No  Emotionally Abused: No    Physically Abused: No    Sexually Abused: No    FAMILY HISTORY: Family History  Problem Relation Age of Onset   Hypertension Mother    Cancer - Other Mother        liver cancer   Heart disease Father    Heart attack Father    Cancer - Lung Father    Diabetes Father    Liver disease Sister    Anemia Sister    Heart disease Sister    Heart attack Sister    Heart disease Brother        CABG 39 YEARS AGO   Hypertension Brother    Mesothelioma Brother        HALF-BROTHER   Cancer - Other Brother        CLL   Cancer - Lung Brother        Mets to brain   Cancer - Lung Sister        HALF-SISTER   Liver disease Brother    Heart disease Brother        CABG-2012   Emphysema Maternal Grandfather    Cancer - Other Paternal Grandmother        Stomach   Heart attack Paternal Grandfather     ALLERGIES:  is allergic to niacinamide, thiazide-type diuretics, and quinolones.  MEDICATIONS:  Current Outpatient Medications  Medication Sig Dispense Refill   amLODipine (NORVASC) 5 MG tablet Take 1 tablet (5 mg total) by mouth daily. (Patient taking differently: Take 10 mg by mouth daily.) 90 tablet 0   ammonium lactate (AMLACTIN) 12 % cream Apply 2 times daily to forearms and hands in preparation for treatment with Efudex (fluorouracil) 385 g 99   aspirin EC 81 MG tablet Take 81 mg by mouth daily. Swallow whole.     azelastine (ASTELIN) 0.1 % nasal spray Place 2 sprays into both nostrils 2 (two) times daily 30 mL 12   fluorouracil (EFUDEX) 5 % cream Apply topically 2 (two) times daily.     furosemide (LASIX) 20 MG tablet 1 tab p.o. daily 90 tablet 3   Magnesium 400 MG TABS Take 400 mg by mouth daily.     metoprolol tartrate  (LOPRESSOR) 50 MG tablet 1 and 1/2 tabs po bid 270 tablet 3   mycophenolate (CELLCEPT) 250 MG capsule Take 2 capsules by mouth  in the morning and 1 capsule in the evening (Patient taking differently: Take by mouth. 1 capsule by mouth in the morning and 1 capsule in the evening.) 270 capsule 3   pantoprazole (PROTONIX) 40 MG tablet Take 1 tablet (40 mg total) by mouth once daily 90 tablet 3   rosuvastatin (CRESTOR) 10 MG tablet Take 1 tablet (10 mg total) by mouth daily. (Patient taking differently: Take 10 mg by mouth at bedtime.) 90 tablet 3   sodium zirconium cyclosilicate (LOKELMA) 10 g PACK packet Take 10 g (1 packet total) by mouth every Monday, Wednesday and Friday. Space 2 hours apart from Tacrolimus. 12 packet 6   tacrolimus (PROGRAF) 1 MG capsule Take 2 capsules (2 mg total) by mouth every morning and 2 capsules (2 mg total) every evening. (Patient taking differently: Take 1-2 mg by mouth See admin instructions. Take 2 mg in the morning and 1 mg at bedtime) 360 capsule 3   triamcinolone cream (KENALOG) 0.1 % Apply 2 times a day to rash. Follow with CeraVe Cream.  Stop when smooth. (Patient taking differently: Apply 1 Application topically  daily as needed (Rash).) 80 g 2   No current facility-administered medications for this visit.   Facility-Administered Medications Ordered in Other Visits  Medication Dose Route Frequency Provider Last Rate Last Admin   heparin lock flush 100 unit/mL  500 Units Intracatheter Daily PRN Truitt Merle, MD       sodium chloride flush (NS) 0.9 % injection 10 mL  10 mL Intracatheter PRN Truitt Merle, MD        REVIEW OF SYSTEMS:   10 Point review of Systems was done is negative except as noted above.   PHYSICAL EXAMINATION:   ECOG FS:2 - Symptomatic, <50% confined to bed   GENERAL:alert, in no acute distress and comfortable SKIN: no acute rashes, no significant lesions EYES: conjunctiva are pink and non-injected, sclera anicteric OROPHARYNX: MMM, no exudates,  no oropharyngeal erythema or ulceration NECK: supple, no JVD LYMPH:  no palpable lymphadenopathy in the cervical, axillary or inguinal regions LUNGS: clear to auscultation b/l with normal respiratory effort HEART: regular rate & rhythm ABDOMEN:  normoactive bowel sounds , non tender, not distended. Extremity: no pedal edema PSYCH: alert & oriented x 3 with fluent speech NEURO: no focal motor/sensory deficits   There were no vitals filed for this visit.  Wt Readings from Last 3 Encounters:  02/14/22 237 lb 12.8 oz (107.9 kg)  01/13/22 239 lb 6.4 oz (108.6 kg)  01/07/22 242 lb (109.8 kg)   There is no height or weight on file to calculate BMI.      LABORATORY DATA:  I have reviewed the data as listed  .    Latest Ref Rng & Units 01/13/2022    9:07 AM 01/07/2022    7:20 AM 08/05/2021   12:00 AM  CBC  WBC 4.0 - 10.5 K/uL 12.5   10.2      Hemoglobin 13.0 - 17.0 g/dL 14.9  15.6  14.9      Hematocrit 39.0 - 52.0 % 45.8  46.0  47      Platelets 150.0 - 400.0 K/uL 402.0   349         This result is from an external source.       Latest Ref Rng & Units 01/13/2022    9:07 AM 01/07/2022    7:20 AM 11/15/2021   12:00 AM  CMP  Glucose 70 - 99 mg/dL 150  147    BUN 6 - 23 mg/dL '12  12  15      '$ Creatinine 0.40 - 1.50 mg/dL 1.30  1.50  1.6      Sodium 135 - 145 mEq/L 138  136  138      Potassium 3.5 - 5.1 mEq/L 5.0  4.3  5.9      Chloride 96 - 112 mEq/L 99  101  96      CO2 19 - 32 mEq/L 32   21      Calcium 8.4 - 10.5 mg/dL 10.0   10.0      Total Protein 6.0 - 8.3 g/dL 7.4     Total Bilirubin 0.2 - 1.2 mg/dL 0.9     Alkaline Phos 39 - 117 U/L 123   136      AST 0 - 37 U/L 14   16      ALT 0 - 53 U/L 15   21         This result is from an external source.    Lab Results  Component Value Date  FERRITIN 128.4 01/14/2021   . RADIOGRAPHIC STUDIES: I have personally reviewed the radiological images as listed and agreed with the findings in the report. No results  found.  ASSESSMENT & PLAN:   72 y.o. male with multiple medical comorbidities with Karlene Lineman with liver cirrhosis with portal hypertension, esophageal varices and portal hypertensive gastropathy with ongoing chronic GI bleeding   #1 Chronic blood loss anemia.  Patient has had recurrent GI bleeding requiring > 20 units of PRBCs. since April 2018 and continues to have ongoing GI bleeding.  Previous workup showed LDH within normal limits at 220 and suggests against overt hemolysis. Sedimentation rate within normal limits. Coombs' test is negative. Myeloma panel and serum free light chains suggest no monoclonal paraproteinemia. PNH testing negative Slightly lower haptoglobin levels can be related to decreased hepatic production, low-level hemolysis due to transfusions or extravascular hemolysis due to splenic or hepatic sequestration.  small bowel endoscopy done on 12/02/2016: impression - The examined portion of the jejunum was normal. - Normal examined duodenum. - Portal hypertensive gastropathy. -Small (< 5 mm) esophageal varices. - No specimens collected.  Capsule Endoscopy on 02/15/17: No evidence of any bleeding during this examination. There was the possibility of an atypical AVM manifested as an erythematous patch. No evidence of any ulcerations, erosions, masses, or polyps.  08/21/2018 upper endoscopy revealed "Small (< 5 mm) esophageal varices. Portal hypertensive gastropathy. Normal examined duodenum. No specimens collected."  07/17/2018 CT A/P w/wo contrast revealed "1.  There is again fusiform dilation of the infrarenal abdominal aorta measuring up to 3.2 cm. Additionally, there is an outpouching of contrast extending along the posterolateral aspect of the aorta, beyond the intimal calcifications, measuring approximately 3.1 x 1.0 cm (image 455, series 7) with additional small outpouching located immediately superiorly. 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 2. Findings of probable pulmonary fibrosis better evaluated on recent dedicated CT of the chest May 2020. 3. Cirrhosis with portal hypertension as previously  described. Trace Ascites. 4. Three-vessel coronary artery disease."  PLAN:  -Discussed lab results from 01/13/22 with patient. CBC showed WBC of 12.5 K, hemoglobin of 14.9 K, and platelets of 402 K. -educated pt that enlarged spleen could be caused due to liver sclerosis -educated pt of elevated neutrophils and eosinophils. No mutations in blood to suggest bone marrow disorder -order tests to rule out genetic bone marrow disorder  FOLLOW UP: Labs today Phone visit with Dr Irene Limbo in 2 weeks  The total time spent in the appointment was *** minutes* .  All of the patient's questions were answered with apparent satisfaction. The patient knows to call the clinic with any problems, questions or concerns.   Sullivan Lone MD MS AAHIVMS [facility] Hematology/Oncology Physician [facility]  .*Total Encounter Time as defined by the Centers for Medicare and Medicaid Services includes, in addition to the face-to-face time of a patient visit (documented in the note above) non-face-to-face time: obtaining and reviewing outside history, ordering and reviewing medications, tests or procedures, care coordination (communications with other health care professionals or caregivers) and documentation in the medical record.   I,Mitra Faeizi,acting as a Education administrator for Sullivan Lone, MD.,have documented all relevant documentation on the behalf of Sullivan Lone, MD,as directed by  Sullivan Lone, MD while in the presence of Sullivan Lone, MD.   ***

## 2022-02-28 ENCOUNTER — Encounter: Payer: Self-pay | Admitting: Family Medicine

## 2022-02-28 ENCOUNTER — Ambulatory Visit (INDEPENDENT_AMBULATORY_CARE_PROVIDER_SITE_OTHER): Payer: Medicare Other | Admitting: Family Medicine

## 2022-02-28 ENCOUNTER — Other Ambulatory Visit (HOSPITAL_COMMUNITY): Payer: Self-pay

## 2022-02-28 VITALS — BP 132/74 | HR 71 | Temp 98.5°F | Ht 69.0 in | Wt 237.4 lb

## 2022-02-28 DIAGNOSIS — I10 Essential (primary) hypertension: Secondary | ICD-10-CM

## 2022-02-28 MED ORDER — METOPROLOL TARTRATE 100 MG PO TABS
100.0000 mg | ORAL_TABLET | Freq: Two times a day (BID) | ORAL | 3 refills | Status: DC
  Filled 2022-02-28: qty 180, 90d supply, fill #0
  Filled 2022-05-26: qty 180, 90d supply, fill #1
  Filled 2022-09-05: qty 180, 90d supply, fill #2
  Filled 2022-12-19: qty 180, 90d supply, fill #3

## 2022-02-28 NOTE — Progress Notes (Signed)
OFFICE VISIT  02/28/2022  CC:  Chief Complaint  Patient presents with   Medical Management of Chronic Issues    Hypertension follow up; pt is fasting    Patient is a 72 y.o. male who presents for 2-week follow-up uncontrolled hypertension. A/P as of last visit: "Uncontrolled hypertension. Increase Lopressor to 1-1/2 of the 50 mg tabs twice a day.  Continue amlodipine 10 mg a day. Of note, mineralocorticoid receptor antagonist contraindicated due to his history of hyperkalemia.   INTERIM HX: Feeling well. Reviewed home bps since last visit: avg syst 138, avg diast 82, HR avg 78.  Past Medical History:  Diagnosis Date   AAA (abdominal aortic aneurysm) ([place]) 03/2014   repaired 2020   Ascending aortic aneurysm (HCC)    unrepaired. Stable at 4.3 cm on imaging by [facility] thoracic surg 10/2019 and 10/2020.   Bilateral renal cysts    simple (03/2014 MRI)   CAD (coronary artery disease)    Cholelithiases 2018   asymptomatic   Chronic diastolic heart failure (HCC)    Chronic renal insufficiency, stage 3 (moderate) ([place]) 2022   baseline sCr 1.3-1.5 (GFR 55)   COVID-19 virus infection 02/2020   sotrovimab infusion   Diabetes mellitus with complication ([place]) 46/6599   A1c 6.8%. A1c 5.4% 11/2019   Diverticulosis    Gout    initially dx'd by diag arthrocentesis of elbow effusion in winter 2021, 3 episodes, all came after being started on chlorthalidone->I d/c'd chlorthal.   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  Improving + off oxygen as of 3 mo s/p liver transplant.  Doing well/resolved as of 02/2020 [facility] pulm f/u->they'll continue to follow.   History of blood transfusion 2018 X 4 dates   Chronic GI blood loss of unknown location despite full GI endoscopic eval, etc   History of liver transplant ([place]) 07/2019   [facility]   Hyperlipemia, mixed    elevated LFTs when on statins.     Hypertension    Cr bump 04/01/16 so I changed benicar-hct to benicar plain and added amlodipine 5 mg.    Increased prostate specific antigen (PSA) velocity 2021   0.11 to 4.77 from 2020 to 2021 (he got a liver transplant and was put on antirejection meds between these psa checks).   Iron deficiency anemia 05/2016   Acute blood loss anemia: hospitalized, required transfusion x 3 U: colonoscopy and capsule study unrevealing.  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  W/u for ? hemolytic anemia to be pursued by hematologist as outpt.  Dr. [name], GI at [facility] following, too---he rec'd onc do bone marrow bx as of Jan 2019   Iron deficiency anemia due to chronic blood loss 2018/19   GI: transfusions x >20 required; multiple endoscopies and bleeding scans unrevealing. Lysteda and octreotide + monthly iron infusions as of 04/2017. Iron infusions changed to every other month as of 09/2018 hem f/u.   Leukocytosis    hem/onc ref 01/2022   Liver cirrhosis secondary to NASH ([place]) 05/2016   Liver transplant 07/2019   Liver failure ([place]) 2020   NASH cirrhosis   Lung field abnormal finding on examination    Bibasilar L>R mild insp crackles-->x-ray showed mild interstitial changes/fibrosis/scarring.  Changes noted on all CXRs in 2018/2019.  Liver Transplant eval 03/2018->mild restriction on PFTs but no obstruction.  See further details in [place] "pulm fibrosis" section   Microscopic hematuria  Eval unremarkable by Dr. Eulogio Ditch.   Nonmelanoma skin cancer 08/15/2018   2020 BCC nose, excised.  2022 R side of neck->mohs   Obesity    Pulmonary fibrosis (HCC)    PFTs: restrictive lung dz ([facility] Liver transplant eval 05/2018). [facility] pulm eval felt this was likely hepatopulmonary syndrome->causing his hypoxia->low likelihood of progression (as of 10/04/18 transplant clinic f/u). Pulm rehab helping as of 2020/2021. OFF oxygen as of 10/2019 transp f/u.   Spleen enlarged     Past Surgical History:  Procedure Laterality Date   ABDOMINAL AORTIC  ANEURYSM REPAIR  08/23/2018   [facility]   CARDIAC CATHETERIZATION     CARDIOVASCULAR STRESS TEST  01/17/2012; 05/04/16   Normal stress nuclear study x 2 (2018--Normal perfusion. LVEF 66% with normal wall motion. This is a low risk study).   CERVICAL DISCECTOMY  1992   COLONOSCOPY N/A 05/27/2016   No site/explanation for blood loss found.  Erythematous mucosa in cecum and ascending colon--Cecal bx: normal.  Procedure: COLONOSCOPY;  Surgeon: Carol Ada, MD;  Location: [facility] ENDOSCOPY;  Service: Endoscopy;  Laterality: N/A;   COLONOSCOPY     01/07/22 diverticulosis but NO POLYPS.  No further recommended   COLONOSCOPY W/ POLYPECTOMY  09/10/2014   Polypectomy x 2: recall 5 yrs (Dr. [name]).   COLONOSCOPY WITH PROPOFOL N/A 01/07/2022   Procedure: COLONOSCOPY WITH PROPOFOL;  Surgeon: Carol Ada, MD;  Location: [facility] ENDOSCOPY;  Service: Gastroenterology;  Laterality: N/A;   CORONARY ANGIOPLASTY     CORONARY ARTERY BYPASS GRAFT  03/2006   CT ABDOMEN WITH CONTRAST ([place] HX)  11/2018   CT CHEST WWO CONTAST ([place] HX)  07/11/2019   No significant change in interstitial lung disease with possible underlying hepatopulmonary syndrome, Increased ascites in the setting of cirrhosis, tiny new right basilar nodule   DEXA  03/07/2019   T score 0.2/NORMAL   ENTEROSCOPY N/A 07/31/2016   Procedure: ENTEROSCOPY;  Surgeon: Ladene Artist, MD;  Location: [facility] ENDOSCOPY;  Service: Endoscopy;  Laterality: N/A;   ENTEROSCOPY N/A 12/02/2016   Procedure: ENTEROSCOPY;  Surgeon: Carol Ada, MD;  Location: [facility] ENDOSCOPY;  Service: Endoscopy;  Laterality: N/A;   ESOPHAGOGASTRODUODENOSCOPY N/A 05/25/2016   No site/explanation for blood loss found.  Procedure: ESOPHAGOGASTRODUODENOSCOPY (EGD);  Surgeon: Juanita Craver, MD;  Location: [facility] ENDOSCOPY;  Service: Endoscopy;  Laterality: N/A;   ESOPHAGOGASTRODUODENOSCOPY N/A 09/23/2016   Procedure: ESOPHAGOGASTRODUODENOSCOPY (EGD);  Surgeon: Carol Ada, MD;  Location: [place];   Service: Endoscopy;  Laterality: N/A;   ESOPHAGOGASTRODUODENOSCOPY (EGD) WITH PROPOFOL N/A 08/21/2018   Procedure: ESOPHAGOGASTRODUODENOSCOPY (EGD) WITH PROPOFOL;  Surgeon: Carol Ada, MD;  Location: [facility] ENDOSCOPY;  Service: Endoscopy;  Laterality: N/A;   GIVENS CAPSULE STUDY N/A 05/27/2016   No source identified.  Repeat 10/2016--results pending.  Procedure: GIVENS CAPSULE STUDY;  Surgeon: Carol Ada, MD;  Location: [facility] ENDOSCOPY;  Service: Endoscopy;  Laterality: N/A;   GIVENS CAPSULE STUDY N/A 10/15/2016   Procedure: GIVENS CAPSULE STUDY;  Surgeon: Carol Ada, MD;  Location: [facility] ENDOSCOPY;  Service: Endoscopy;  Laterality: N/A;   GIVENS CAPSULE STUDY N/A 02/13/2017   Procedure: GIVENS CAPSULE STUDY;  Surgeon: Carol Ada, MD;  Location: [place];  Service: Endoscopy;  Laterality: N/A;   LIVER TRANSPLANT  08/02/2019   For NASH cirrhosis. [place]   NM GI BLOOD LOSS  01/27/2017   NEGATIVE   SMALL BOWEL ENTEROSCOPY  10/2016   ([facility], Dr. [name]: Double balloon enteroscopy--to the level of proximal ileum) jejunal polyps- which were  removed--not bleeding & benign.  No source of bleeding was identified.   TRANSTHORACIC ECHOCARDIOGRAM  05/25/2016; 10/2017   05/2016: EF 60%, normal wall motion, grd II DD, mild aortic stenosis, dilated aortic root and ascending aorta. 10/2017: Mild LVH; no mention made of LV function.  Moderate aorticstenosis with mean gradient 19 peak gradient 41. AVA 1.1 cm^2.  05/2018 EF 55%,4.3 cm ascend aneur, mild AS. 10/2020 EF 60-65%, normal diast fxn, mod AV sclerosis w/out sten/regurg.   VASECTOMY  1977    Outpatient Medications Prior to Visit  Medication Sig Dispense Refill   amLODipine (NORVASC) 5 MG tablet Take 1 tablet (5 mg total) by mouth daily. (Patient taking differently: Take 10 mg by mouth daily.) 90 tablet 0   ammonium lactate (AMLACTIN) 12 % cream Apply 2 times daily to forearms and hands in preparation for treatment with Efudex  (fluorouracil) 385 g 99   aspirin EC 81 MG tablet Take 81 mg by mouth daily. Swallow whole.     furosemide (LASIX) 20 MG tablet 1 tab p.o. daily 90 tablet 3   Magnesium 400 MG TABS Take 400 mg by mouth daily.     mycophenolate (CELLCEPT) 250 MG capsule Take 2 capsules by mouth  in the morning and 1 capsule in the evening (Patient taking differently: Take by mouth. 1 capsule by mouth in the morning and 1 capsule in the evening.) 270 capsule 3   pantoprazole (PROTONIX) 40 MG tablet Take 1 tablet (40 mg total) by mouth once daily 90 tablet 3   rosuvastatin (CRESTOR) 10 MG tablet Take 1 tablet (10 mg total) by mouth daily. (Patient taking differently: Take 10 mg by mouth at bedtime.) 90 tablet 3   sodium zirconium cyclosilicate (LOKELMA) 10 g PACK packet Take 10 g (1 packet total) by mouth every Monday, Wednesday and Friday. Space 2 hours apart from Tacrolimus. 12 packet 6   tacrolimus (PROGRAF) 1 MG capsule Take 2 capsules (2 mg total) by mouth every morning and 2 capsules (2 mg total) every evening. (Patient taking differently: Take 1-2 mg by mouth See admin instructions. Take 2 mg in the morning and 1 mg at bedtime) 360 capsule 3   triamcinolone cream (KENALOG) 0.1 % Apply 2 times a day to rash. Follow with CeraVe Cream.  Stop when smooth. (Patient taking differently: Apply 1 Application topically daily as needed (Rash).) 80 g 2   metoprolol tartrate (LOPRESSOR) 50 MG tablet 1 and 1/2 tabs po bid 270 tablet 3   azelastine (ASTELIN) 0.1 % nasal spray Place 2 sprays into both nostrils 2 (two) times daily (Patient not taking: Reported on 02/25/2022) 30 mL 12   fluorouracil (EFUDEX) 5 % cream Apply topically 2 (two) times daily. (Patient not taking: Reported on 02/28/2022)     Facility-Administered Medications Prior to Visit  Medication Dose Route Frequency Provider Last Rate Last Admin   heparin lock flush 100 unit/mL  500 Units Intracatheter Daily PRN Truitt Merle, MD       sodium chloride flush (NS) 0.9 %  injection 10 mL  10 mL Intracatheter PRN Truitt Merle, MD        Allergies  Allergen Reactions   Niacinamide Itching    Improved upon discontinuation    Thiazide-Type Diuretics Other (See Comments)    Chlorthalidone caused gout   Quinolones Other (See Comments)    CONTRAINDICATED D/T PT HAVING AAA    Review of Systems As per HPI  PE:    02/28/2022    8:32 AM  02/25/2022    9:08 AM 02/14/2022    1:06 PM  Vitals with BMI  Height '5\' 9"'$   '5\' 9"'$   Weight 237 lbs 6 oz 238 lbs 13 oz 237 lbs 13 oz  BMI 35.04 16.10 96.0  Systolic 454 098 119  Diastolic 74 90 78  Pulse 71 71 85     Physical Exam  Gen: Alert, well appearing.  Patient is oriented to person, place, time, and situation. AFFECT: pleasant, lucid thought and speech. CV: RRR, 2/6 syst murmur, no diastolic murmur.  No rub EXT: 1+ bilat LE pitting edema  LABS:  Last CBC Lab Results  Component Value Date   WBC 12.0 (H) 02/25/2022   HGB 14.8 02/25/2022   HCT 43.5 02/25/2022   MCV 84.3 02/25/2022   MCH 28.7 02/25/2022   RDW 16.3 (H) 02/25/2022   PLT 345 14/78/2956   Last metabolic panel Lab Results  Component Value Date   GLUCOSE 149 (H) 02/25/2022   NA 136 02/25/2022   K 4.2 02/25/2022   CL 101 02/25/2022   CO2 28 02/25/2022   BUN 11 02/25/2022   CREATININE 1.24 02/25/2022   GFRNONAA >60 02/25/2022   CALCIUM 9.7 02/25/2022   PHOS 2.9 07/29/2016   PROT 7.2 02/25/2022   ALBUMIN 4.3 02/25/2022   LABGLOB 2.4 10/29/2021   AGRATIO 2.0 10/29/2021   BILITOT 1.0 02/25/2022   ALKPHOS 119 02/25/2022   AST 15 02/25/2022   ALT 18 02/25/2022   ANIONGAP 7 02/25/2022     Lab Results  Component Value Date   HGBA1C 6.3 (A) 01/13/2022   HGBA1C 6.3 01/13/2022   HGBA1C 6.3 01/13/2022   HGBA1C 6.3 01/13/2022   IMPRESSION AND PLAN:  Hypertension, not not ideal control. Increase Lopressor to 100 mg twice daily.  Continue amlodipine 10 mg a day. Will consider trial of low-dose ACE or ARB if not controlled at f/u 1  mo. Of note, mineralocorticoid receptor antagonist contraindicated due to his history of hyperkalemia.  An After Visit Summary was printed and given to the patient.  FOLLOW UP: Return in about 4 weeks (around 03/28/2022) for f/u HTN.  Signed:  Crissie Sickles, MD           02/28/2022

## 2022-03-01 ENCOUNTER — Other Ambulatory Visit (HOSPITAL_COMMUNITY): Payer: Self-pay

## 2022-03-01 ENCOUNTER — Other Ambulatory Visit: Payer: Self-pay | Admitting: Pharmacist Clinician (PhC)/ Clinical Pharmacy Specialist

## 2022-03-01 ENCOUNTER — Other Ambulatory Visit: Payer: Self-pay | Admitting: Family Medicine

## 2022-03-02 ENCOUNTER — Other Ambulatory Visit (HOSPITAL_COMMUNITY): Payer: Self-pay

## 2022-03-02 ENCOUNTER — Encounter: Payer: Self-pay | Admitting: Family Medicine

## 2022-03-02 LAB — MPN W/HYPEREOSINOPHILIA FISH

## 2022-03-03 ENCOUNTER — Other Ambulatory Visit (HOSPITAL_COMMUNITY): Payer: Self-pay

## 2022-03-03 ENCOUNTER — Encounter: Payer: Self-pay | Admitting: Hematology

## 2022-03-03 MED ORDER — AMLODIPINE BESYLATE 10 MG PO TABS
10.0000 mg | ORAL_TABLET | Freq: Every day | ORAL | 3 refills | Status: DC
  Filled 2022-03-03: qty 90, 90d supply, fill #0
  Filled 2022-05-26: qty 90, 90d supply, fill #1
  Filled 2022-09-05: qty 90, 90d supply, fill #2
  Filled 2022-12-19: qty 90, 90d supply, fill #3

## 2022-03-03 MED ORDER — FUROSEMIDE 20 MG PO TABS
20.0000 mg | ORAL_TABLET | Freq: Every day | ORAL | 3 refills | Status: DC
  Filled 2022-03-03: qty 90, 90d supply, fill #0
  Filled 2022-05-26: qty 90, 90d supply, fill #1
  Filled 2022-08-25: qty 90, 90d supply, fill #2
  Filled 2022-11-19: qty 90, 90d supply, fill #3

## 2022-03-03 NOTE — Telephone Encounter (Signed)
Pt was recently seen this week for follow up. Pls confirm if dose needs to be changed to '10mg'$  tablet or '5mg'$  bid for Amlodipine. Med pending for Lasix as "normal" instead of "no print".  Please advise.

## 2022-03-03 NOTE — Telephone Encounter (Signed)
Ok, done

## 2022-03-04 DIAGNOSIS — Z944 Liver transplant status: Secondary | ICD-10-CM | POA: Diagnosis not present

## 2022-03-04 DIAGNOSIS — Z940 Kidney transplant status: Secondary | ICD-10-CM | POA: Diagnosis not present

## 2022-03-04 DIAGNOSIS — D849 Immunodeficiency, unspecified: Secondary | ICD-10-CM | POA: Diagnosis not present

## 2022-03-08 ENCOUNTER — Other Ambulatory Visit: Payer: Self-pay

## 2022-03-08 ENCOUNTER — Other Ambulatory Visit (HOSPITAL_COMMUNITY): Payer: Self-pay

## 2022-03-08 LAB — JAK2 (INCLUDING V617F AND EXON 12), MPL,& CALR W/RFL MPN PANEL (NGS)

## 2022-03-08 LAB — BCR ABL1 FISH (GENPATH)

## 2022-03-08 MED ORDER — MYCOPHENOLATE MOFETIL 250 MG PO CAPS
ORAL_CAPSULE | ORAL | 3 refills | Status: DC
  Filled 2022-03-08: qty 360, 90d supply, fill #0
  Filled 2022-06-03: qty 360, 90d supply, fill #1
  Filled 2022-09-06 (×2): qty 360, 90d supply, fill #2
  Filled 2022-12-12: qty 360, 90d supply, fill #3

## 2022-03-09 ENCOUNTER — Other Ambulatory Visit (HOSPITAL_COMMUNITY): Payer: Self-pay

## 2022-03-14 ENCOUNTER — Inpatient Hospital Stay: Payer: Medicare Other | Attending: Hematology | Admitting: Hematology

## 2022-03-14 DIAGNOSIS — D75839 Thrombocytosis, unspecified: Secondary | ICD-10-CM | POA: Diagnosis not present

## 2022-03-14 DIAGNOSIS — D721 Eosinophilia, unspecified: Secondary | ICD-10-CM

## 2022-03-14 DIAGNOSIS — D729 Disorder of white blood cells, unspecified: Secondary | ICD-10-CM

## 2022-03-14 DIAGNOSIS — D72829 Elevated white blood cell count, unspecified: Secondary | ICD-10-CM | POA: Diagnosis not present

## 2022-03-14 NOTE — Progress Notes (Signed)
HEMATOLOGY/ONCOLOGY CLINIC NOTE  Date of Service: 03/14/22  Patient Care Team: Tammi Sou, MD as PCP - General (Family Medicine) Troy Sine, MD as PCP - Cardiology (Cardiology) Franchot Gallo, MD as Consulting Physician (Urology) Troy Sine, MD as Consulting Physician (Cardiology) Brunetta Genera, MD as Consulting Physician (Hematology) [name], Gaspar Skeeters, MD as Consulting Physician (Gastroenterology) Carol Ada, MD as Consulting Physician (Gastroenterology) Rege, Dwain Sarna, MD as Consulting Physician (General Surgery) Lovenia Shuck, MD as Consulting Physician (Cardiothoracic Surgery) Blazing, Venia Carbon, MD as Consulting Physician (Cardiology) Teena Irani, MD as Consulting Physician (Internal Medicine) Dorna Leitz, MD as Consulting Physician (Orthopedic Surgery) Tamsen Meek, MD as Consulting Physician (Dermatology) Brunetta Genera, MD as Consulting Physician (Hematology)  CHIEF COMPLAINTS/PURPOSE OF CONSULTATION:   Leukocytosis in patient with h/o liver transplantation.  HISTORY OF PRESENTING ILLNESS:   William [name] was previously followed for anemia primarily related to ongoing issues with GI bleeding and has not been controllable by multiple GI interventions in the context of NASH related liver cirrhosis.  He was last seen by me on 06/27/19 and has been referred back to [place] for evaluation of leucocytosis.  He has seen had a liver transplantation on 08/02/2019 and continues to f/u with his transplant team at [facility]. His anemia and GI bleeding issues have resolved since the transplantation. He is on tacrolimus and Cellcept for immunosupression.  He is folowing with [facility] dermatology for recurrent cutaneous SCC and BCC's and is using effudex topically.  He also continues to follow ith [facility] for concerns for Pulmonary fibrosis.  He's had a Covid-19 infection a couple of times, his last infection in September  2023. He has received medication for this infection and denies having many symptoms. No sudden weight loss or weight changes. No new medication besides his transplant medications. No unexpected, fever, night sweats, or chills. No prostate issues besides having slight elevation a few years ago. No abdominal pain or back pain.   He reports that the cyst on his back is stable and unchanged from baseline. He does not come in contact with any farm animals or pets. He has not traveled outside of the [place].   He reports that he's had several lesions in his head removed in May. He's also  had a skin lesion removed on his neck in October/November. He used humex cream previously, but hasn't used it in a while. He's also previously used Nectine lotion on his arms.  His wife reports that he has some difficulty hearing. She also reveals that he had an elevated potassium level 2 months ago. 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   He has used a nasal spray for the past 6 months for nasal allergies. He reports some improvement in symptoms. He is compliant with his Cellcept and tacrolimus medications. He reports that his tacrolmus levels were low recently. No concern for adjusting medications. He has his blood drawn every 2 weeks at [place].   He denies any tooth pain, bone pain, urinary symptoms, or skin rashes. No recent drug allergies, although he notes a previous drug allergy reaction one year ago. Today, he is accompanied by his wife. He reports that he has had an elevated WBC since September/ October 2023. His wife notes that it has resulted at 12.5 K for the last month.   INTERVAL HISTORY: GRANTLY SHAUT is contacted via phone for continued evaluation and management of Leukocytosis. .I connected with Shona Needles  on 03/14/2022 at  8:40 AM EST by telephone visit and verified that I am speaking with the correct person using two identifiers.   Patient reports he has been doing well overall without any  new medical concerns since our last visit. He denies fever, chills, new infection issues, night sweats, abdominal pain, chest pain, or leg swelling.   We discussed the lab results from 02/25/2022 and discussed molecular lab results.   I discussed the limitations, risks, security and privacy concerns of performing an evaluation and management service by telemedicine and the availability of in-person appointments. I also discussed with the patient that there may be a patient responsible charge related to this service. The patient expressed understanding and agreed to proceed.   Other persons participating in the visit and their role in the encounter: None   Patient's location: Home  Provider's location: [facility]   Chief Complaint: Leukocytosis     MEDICAL HISTORY:  Past Medical History:  Diagnosis Date   AAA (abdominal aortic aneurysm) ([place]) 03/2014   repaired 2020   Ascending aortic aneurysm (HCC)    unrepaired. Stable at 4.3 cm on imaging by [facility] thoracic surg 10/2019 and 10/2020.   Bilateral renal cysts    simple (03/2014 MRI)   CAD (coronary artery disease)    Cholelithiases 2018   asymptomatic   Chronic diastolic heart failure (HCC)    Chronic renal insufficiency, stage 3 (moderate) ([place]) 2022   baseline sCr 1.3-1.5 (GFR 55)   COVID-19 virus infection 02/2020   sotrovimab infusion   Diabetes mellitus with complication ([place]) [id number]   A1c 6.8%. A1c 5.4% 11/2019   Diverticulosis    Gout    initially dx'd by diag arthrocentesis of elbow effusion in winter 2021, 3 episodes, all came after being started on chlorthalidone->I d/c'd chlorthal.   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  Improving + off oxygen as of 3 mo s/p liver transplant.  Doing well/resolved as of 02/2020 [facility] pulm f/u->they'll continue to follow.   History of blood transfusion 2018 X 4 dates   Chronic GI blood loss of unknown location despite full GI endoscopic eval, etc   History of liver transplant ([place])  07/2019   [facility]   Hyperlipemia, mixed    elevated LFTs when on statins.     Hypertension    Cr bump 04/01/16 so I changed benicar-hct to benicar plain and added amlodipine 5 mg.   Increased prostate specific antigen (PSA) velocity 2021   0.11 to 4.77 from 2020 to 2021 (he got a liver transplant and was put on antirejection meds between these psa checks).   Iron deficiency anemia 05/2016   Acute blood loss anemia: hospitalized, required transfusion x 3 U: colonoscopy and capsule study unrevealing.  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  W/u for ? hemolytic anemia to be pursued by hematologist as outpt.  Dr. [name], GI at [facility] following, too---he rec'd onc do bone marrow bx as of Jan 2019   Iron deficiency anemia due to chronic blood loss 2018/19   GI: transfusions x >20 required; multiple endoscopies and bleeding scans unrevealing. Lysteda and octreotide + monthly iron infusions as of 04/2017. Iron infusions changed to every other month as of 09/2018 hem f/u.   Leukocytosis    hem/onc ref 01/2022   Liver cirrhosis secondary to NASH ([place]) 05/2016   Liver transplant 07/2019   Liver failure ([place]) 2020  NASH cirrhosis   Lung field abnormal finding on examination    Bibasilar L>R mild insp crackles-->x-ray showed mild interstitial changes/fibrosis/scarring.  Changes noted on all CXRs in 2018/2019.  Liver Transplant eval 03/2018->mild restriction on PFTs but no obstruction.  See further details in PMH "pulm fibrosis" section   Microscopic hematuria    Eval unremarkable by Dr. Eulogio Ditch.   Nonmelanoma skin cancer 08/15/2018   2020 BCC nose, excised.  2022 R side of neck->mohs   Obesity    Pulmonary fibrosis (HCC)    PFTs: restrictive lung dz ([facility] transplant eval 05/2018). [facility] pulm eval felt this was likely hepatopulmonary syndrome->causing his hypoxia->low likelihood of progression (as of 10/04/18  transplant clinic f/u). Pulm rehab helping as of 2020/2021. OFF oxygen as of 10/2019 transp f/u.   Spleen enlarged     SURGICAL HISTORY: Past Surgical History:  Procedure Laterality Date   ABDOMINAL AORTIC ANEURYSM REPAIR  08/23/2018   [facility]   CARDIAC CATHETERIZATION     CARDIOVASCULAR STRESS TEST  01/17/2012; 05/04/16   Normal stress nuclear study x 2 (2018--Normal perfusion. LVEF 66% with normal wall motion. This is a low risk study).   CERVICAL DISCECTOMY  1992   COLONOSCOPY N/A 05/27/2016   No site/explanation for blood loss found.  Erythematous mucosa in cecum and ascending colon--Cecal bx: normal.  Procedure: COLONOSCOPY;  Surgeon: Carol Ada, MD;  Location[facility] ENDOSCOPY;  Service: Endoscopy;  Laterality: N/A;   COLONOSCOPY     01/07/22 diverticulosis but NO POLYPS.  No further recommended   COLONOSCOPY W/ POLYPECTOMY  09/10/2014   Polypectomy x 2: recall 5 yrs (Dr. [name]).   COLONOSCOPY WITH PROPOFOL N/A 01/07/2022   Procedure: COLONOSCOPY WITH PROPOFOL;  Surgeon: Carol Ada, MD;  Location: [facility] ENDOSCOPY;  Service: Gastroenterology;  Laterality: N/A;   CORONARY ANGIOPLASTY     CORONARY ARTERY BYPASS GRAFT  03/2006   CT ABDOMEN WITH CONTRAST ([place] HX)  11/2018   CT CHEST WWO CONTAST ([place] HX)  07/11/2019   No significant change in interstitial lung disease with possible underlying hepatopulmonary syndrome, Increased ascites in the setting of cirrhosis, tiny new right basilar nodule   DEXA  03/07/2019   T score 0.2/NORMAL   ENTEROSCOPY N/A 07/31/2016   Procedure: ENTEROSCOPY;  Surgeon: Ladene Artist, MD;  Location: [facility] ENDOSCOPY;  Service: Endoscopy;  Laterality: N/A;   ENTEROSCOPY N/A 12/02/2016   Procedure: ENTEROSCOPY;  Surgeon: Carol Ada, MD;  Location: [facility] ENDOSCOPY;  Service: Endoscopy;  Laterality: N/A;   ESOPHAGOGASTRODUODENOSCOPY N/A 05/25/2016   No site/explanation for blood loss found.  Procedure: ESOPHAGOGASTRODUODENOSCOPY (EGD);  Surgeon: Juanita Craver, MD;   Location: [facility] ENDOSCOPY;  Service: Endoscopy;  Laterality: N/A;   ESOPHAGOGASTRODUODENOSCOPY N/A 09/23/2016   Procedure: ESOPHAGOGASTRODUODENOSCOPY (EGD);  Surgeon: Carol Ada, MD;  Location: [place];  Service: Endoscopy;  Laterality: N/A;   ESOPHAGOGASTRODUODENOSCOPY (EGD) WITH PROPOFOL N/A 08/21/2018   Procedure: ESOPHAGOGASTRODUODENOSCOPY (EGD) WITH PROPOFOL;  Surgeon: Carol Ada, MD;  Location: [facility] ENDOSCOPY;  Service: Endoscopy;  Laterality: N/A;   GIVENS CAPSULE STUDY N/A 05/27/2016   No source identified.  Repeat 10/2016--results pending.  Procedure: GIVENS CAPSULE STUDY;  Surgeon: Carol Ada, MD;  Location: [facility] END[facility]COPY;  Service: Endoscopy;  Laterality: N/A;   GIVENS CAPSULE STUDY N/A 10/15/2016   Procedure: GIVENS CAPSULE STUDY;  Surgeon: Carol Ada, MD;  Location: [facility] ENDOSCOPY;  Service: Endoscopy;  Laterality: N/A;   GIVENS CAPSULE STUDY N/A 02/13/2017   Procedure: GIVENS CAPSULE STUDY;  Surgeon: Carol Ada, MD;  Location: [place];  Service: Endoscopy;  Laterality: N/A;   LIVER TRANSPLANT  08/02/2019   For NASH cirrhosis. [place]   NM GI BLOOD LOSS  01/27/2017   NEGATIVE   SMALL BOWEL ENTEROSCOPY  10/2016   ([facility], Dr. [name]: Double balloon enteroscopy--to the level of proximal ileum) jejunal polyps- which were removed--not bleeding & benign.  No source of bleeding was identified.   TRANSTHORACIC ECHOCARDIOGRAM  05/25/2016; 10/2017   05/2016: EF 60%, normal wall motion, grd II DD, mild aortic stenosis, dilated aortic root and ascending aorta. 10/2017: Mild LVH; no mention made of LV function.  Moderate aorticstenosis with mean gradient 19 peak gradient 41. AVA 1.1 cm^2.  05/2018 EF 55%,4.3 cm ascend aneur, mild AS. 10/2020 EF 60-65%, normal diast fxn, mod AV sclerosis w/out sten/regurg.   VASECTOMY  1977    SOCIAL HISTORY: Social History   Socioeconomic History   Marital status: Married    Spouse name: Not on file   Number of children: Not on  file   Years of education: Not on file   Highest education level: 10th grade  Occupational History   Not on file  Tobacco Use   Smoking status: Former    Types: Cigarettes   Smokeless tobacco: Never   Tobacco comments:    QUIT I 1983  Vaping Use   Vaping Use: Never used  Substance and Sexual Activity   Alcohol use: No   Drug use: No   Sexual activity: Yes  Other Topics Concern   Not on file  Social History Narrative   Married, 3 grown children, 3 GCs.   Educ: 10th grade.   Occupation: Retired Brewing technologist.   Tob: quit 1983, smoked about 20 pack-yr hx prior.   No alcohol.   Social Determinants of Health   Financial Resource Strain: Low Risk  (07/21/2021)   Overall Financial Resource Strain (CARDIA)    Difficulty of Paying Living Expenses: Not hard at all  Food Insecurity: No Food Insecurity (07/21/2021)   Hunger Vital Sign    Worried About Running Out of Food in the Last Year: Never true    Ran Out of Food in the Last Year: Never true  Transportation Needs: No Transportation Needs (07/21/2021)   PRAPARE - Hydrologist (Medical): No    Lack of Transportation (Non-Medical): No  Physical Activity: Inactive (07/21/2021)   Exercise Vital Sign    Days of Exercise per Week: 0 days    Minutes of Exercise per Session: 0 min  Stress: No Stress Concern Present (07/21/2021)   [place]    Feeling of Stress : Not at all  Social Connections: Moderately Integrated (07/21/2021)   Social Connection and Isolation Panel [NHANES]    Frequency of Communication with Friends and Family: More than three times a week    Frequency of Social Gatherings with Friends and Family: More than three times a week    Attends Religious Services: 1 to 4 times per year    Active Member of [employer] or Organizations: No    Attends Archivist Meetings: Never    Marital Status: Married  Human resources officer  Violence: Not At Risk (07/21/2021)   Humiliation, Afraid, Rape, and Kick questionnaire    Fear of Current or Ex-Partner: No    Emotionally Abused: No    Physically Abused: No    Sexually Abused: No    FAMILY HISTORY: Family History  Problem  Relation Age of Onset   Hypertension Mother    Cancer - Other Mother        liver cancer   Heart disease Father    Heart attack Father    Cancer - Lung Father    Diabetes Father    Liver disease Sister    Anemia Sister    Heart disease Sister    Heart attack Sister    Heart disease Brother        CABG 4 YEARS AGO   Hypertension Brother    Mesothelioma Brother        HALF-BROTHER   Cancer - Other Brother        CLL   Cancer - Lung Brother        Mets to brain   Cancer - Lung Sister        HALF-SISTER   Liver disease Brother    Heart disease Brother        CABG-2012   Emphysema Maternal Grandfather    Cancer - Other Paternal Grandmother        Stomach   Heart attack Paternal Grandfather     ALLERGIES:  is allergic to niacinamide, thiazide-type diuretics, and quinolones.  MEDICATIONS:  Current Outpatient Medications  Medication Sig Dispense Refill   amLODipine (NORVASC) 10 MG tablet Take 1 tablet (10 mg total) by mouth daily. 90 tablet 3   ammonium lactate (AMLACTIN) 12 % cream Apply 2 times daily to forearms and hands in preparation for treatment with Efudex (fluorouracil) 385 g 99   aspirin EC 81 MG tablet Take 81 mg by mouth daily. Swallow whole.     azelastine (ASTELIN) 0.1 % nasal spray Place 2 sprays into both nostrils 2 (two) times daily (Patient not taking: Reported on 02/25/2022) 30 mL 12   furosemide (LASIX) 20 MG tablet Take 1 tablet (20 mg total) by mouth daily. 90 tablet 3   Magnesium 400 MG TABS Take 400 mg by mouth daily.     metoprolol tartrate (LOPRESSOR) 100 MG tablet Take 1 tablet (100 mg total) by mouth 2 (two) times daily. 180 tablet 3   mycophenolate (CELLCEPT) 250 MG capsule Take 2 capsules by mouth  in the  morning and 1 capsule in the evening (Patient taking differently: Take by mouth. 1 capsule by mouth in the morning and 1 capsule in the evening.) 270 capsule 3   mycophenolate (CELLCEPT) 250 MG capsule Take 2 capsules by mouth in the morning and 2 capsules in the evening 360 capsule 3   pantoprazole (PROTONIX) 40 MG tablet Take 1 tablet (40 mg total) by mouth once daily 90 tablet 3   rosuvastatin (CRESTOR) 10 MG tablet Take 1 tablet (10 mg total) by mouth daily. (Patient taking differently: Take 10 mg by mouth at bedtime.) 90 tablet 3   sodium zirconium cyclosilicate (LOKELMA) 10 g PACK packet Take 10 g (1 packet total) by mouth every Monday, Wednesday and Friday. Space 2 hours apart from Tacrolimus. 12 packet 6   tacrolimus (PROGRAF) 1 MG capsule Take 2 capsules (2 mg total) by mouth every morning and 2 capsules (2 mg total) every evening. (Patient taking differently: Take 1-2 mg by mouth See admin instructions. Take 2 mg in the morning and 1 mg at bedtime) 360 capsule 3   triamcinolone cream (KENALOG) 0.1 % Apply 2 times a day to rash. Follow with CeraVe Cream.  Stop when smooth. (Patient taking differently: Apply 1 Application topically daily as needed (Rash).) 80 g 2  No current facility-administered medications for this visit.   Facility-Administered Medications Ordered in Other Visits  Medication Dose Route Frequency Provider Last Rate Last Admin   heparin lock flush 100 unit/mL  500 Units Intracatheter Daily PRN Truitt Merle, MD       sodium chloride flush (NS) 0.9 % injection 10 mL  10 mL Intracatheter PRN Truitt Merle, MD        REVIEW OF SYSTEMS:   10 Point review of Systems was done is negative except as noted above.   PHONE VISIT  LABORATORY DATA:  I have reviewed the data as listed  .    Latest Ref Rng & Units 02/25/2022    9:57 AM 01/13/2022    9:07 AM 01/07/2022    7:20 AM  CBC  WBC 4.0 - 10.5 K/uL 12.0  12.5    Hemoglobin 13.0 - 17.0 g/dL 14.8  14.9  15.6   Hematocrit 39.0 -  52.0 % 43.5  45.8  46.0   Platelets 150 - 400 K/uL 345  402.0     .CBC    Component Value Date/Time   WBC 12.0 (H) 02/25/2022 0957   WBC 12.5 (H) 01/13/2022 0907   RBC 5.16 02/25/2022 0957   HGB 14.8 02/25/2022 0957   HGB 8.7 (L) 02/10/2017 0948   HCT 43.5 02/25/2022 0957   HCT 27.2 (L) 02/10/2017 0948   PLT 345 02/25/2022 0957   PLT 103 (L) 02/10/2017 0948   PLT 125 (L) 07/28/2016 1416   MCV 84.3 02/25/2022 0957   MCV 82.1 02/10/2017 0948   MCH 28.7 02/25/2022 0957   MCHC 34.0 02/25/2022 0957   RDW 16.3 (H) 02/25/2022 0957   RDW 19.0 (H) 02/10/2017 0948   LYMPHSABS 1.3 02/25/2022 0957   LYMPHSABS 1.1 02/10/2017 0948   MONOABS 1.0 02/25/2022 0957   MONOABS 1.2 (H) 02/10/2017 0948   EOSABS 1.4 (H) 02/25/2022 0957   EOSABS 0.7 (H) 02/10/2017 0948   BASOSABS 0.1 02/25/2022 0957   BASOSABS 0.1 02/10/2017 0948        Latest Ref Rng & Units 02/25/2022    9:57 AM 01/13/2022    9:07 AM 01/07/2022    7:20 AM  CMP  Glucose 70 - 99 mg/dL 149  150  147   BUN 8 - 23 mg/dL 11  12  12   $ Creatinine 0.61 - 1.24 mg/dL 1.24  1.30  1.50   Sodium 135 - 145 mmol/L 136  138  136   Potassium 3.5 - 5.1 mmol/L 4.2  5.0  4.3   Chloride 98 - 111 mmol/L 101  99  101   CO2 22 - 32 mmol/L 28  32    Calcium 8.9 - 10.3 mg/dL 9.7  10.0    Total Protein 6.5 - 8.1 g/dL 7.2  7.4    Total Bilirubin 0.3 - 1.2 mg/dL 1.0  0.9    Alkaline Phos 38 - 126 U/L 119  123    AST 15 - 41 U/L 15  14    ALT 0 - 44 U/L 18  15      Lab Results  Component Value Date   FERRITIN 64 02/25/2022   BCR/ABL-1 Results from 02/25/2022:   Sedimentation results from 02/25/2022:   JAK-2 results from 02/25/2022:   MPN FISH results:   C-reactive protein result from 02/25/2022:   RADIOGRAPHIC STUDIES: I have personally reviewed the radiological images as listed and agreed with the findings in the report. No results found.  ASSESSMENT &  PLAN:   72 y.o. male with multiple medical comorbidities with Karlene Lineman with  liver cirrhosis s/p liver transplantation in 07/2019 with   #1 Leucocytosis - mild Wbc 01/13/2022 - wbc 12.5k ANC 8.4k, Absolute eosinophilis 1.9k, immature grans 0.19k PLT 402k Normal Hgb/hct.  2) h/o Chronic blood loss anemia.- resolved post liver transplant.  PLAN: -Discussed lab results from 02/25/2022 with the patient. CBC showed WBC of 12.0. CMP stable.  -Discussed molecular lab results from 02/25/2022, which were all negative. There is no evidence of MPN. -Advised to follow-up with his transplant team with labs.  - Recommended to connect with transplant team to ensure optimal immunosupression and r/o any concerns for subacute rejection (though less likely-LFTs WNL).  FOLLOW UP: RTC with Dr Irene Limbo with labs in 6 months  The total time spent in the appointment was 15 minutes* .  All of the patient's questions were answered with apparent satisfaction. The patient knows to call the clinic with any problems, questions or concerns.   Sullivan Lone MD MS AAHIVMS [facility] Hematology/Oncology Physician [facility]  .*Total Encounter Time as defined by the Centers for Medicare and Medicaid Services includes, in addition to the face-to-face time of a patient visit (documented in the note above) non-face-to-face time: obtaining and reviewing outside history, ordering and reviewing medications, tests or procedures, care coordination (communications with other health care professionals or caregivers) and documentation in the medical record.   I, Cleda Mccreedy, am acting as a Education administrator for Sullivan Lone, MD.  .I have reviewed the above documentation for accuracy and completeness, and I agree with the above. Brunetta Genera MD

## 2022-03-16 DIAGNOSIS — D849 Immunodeficiency, unspecified: Secondary | ICD-10-CM | POA: Diagnosis not present

## 2022-03-16 DIAGNOSIS — Z944 Liver transplant status: Secondary | ICD-10-CM | POA: Diagnosis not present

## 2022-03-16 DIAGNOSIS — Z940 Kidney transplant status: Secondary | ICD-10-CM | POA: Diagnosis not present

## 2022-03-17 ENCOUNTER — Encounter: Payer: Self-pay | Admitting: Cardiovascular Disease

## 2022-03-20 ENCOUNTER — Encounter: Payer: Self-pay | Admitting: Hematology

## 2022-03-21 ENCOUNTER — Encounter: Payer: Self-pay | Admitting: Hematology

## 2022-03-21 ENCOUNTER — Other Ambulatory Visit (HOSPITAL_COMMUNITY): Payer: Self-pay

## 2022-03-23 ENCOUNTER — Other Ambulatory Visit (HOSPITAL_COMMUNITY): Payer: Self-pay

## 2022-03-24 ENCOUNTER — Other Ambulatory Visit (HOSPITAL_COMMUNITY): Payer: Self-pay

## 2022-03-24 ENCOUNTER — Other Ambulatory Visit: Payer: Self-pay

## 2022-03-24 MED ORDER — TACROLIMUS 1 MG PO CAPS
ORAL_CAPSULE | ORAL | 3 refills | Status: DC
  Filled 2022-03-24: qty 360, 90d supply, fill #0

## 2022-03-24 MED ORDER — TACROLIMUS 1 MG PO CAPS
ORAL_CAPSULE | ORAL | 3 refills | Status: DC
  Filled 2022-05-11: qty 360, fill #0
  Filled 2022-05-12: qty 360, 90d supply, fill #0
  Filled 2022-08-10: qty 360, 90d supply, fill #1

## 2022-03-25 ENCOUNTER — Other Ambulatory Visit (HOSPITAL_COMMUNITY): Payer: Self-pay

## 2022-03-28 ENCOUNTER — Ambulatory Visit: Payer: Medicare Other | Admitting: Family Medicine

## 2022-03-28 ENCOUNTER — Ambulatory Visit (INDEPENDENT_AMBULATORY_CARE_PROVIDER_SITE_OTHER): Payer: Medicare Other | Admitting: Family Medicine

## 2022-03-28 ENCOUNTER — Encounter: Payer: Self-pay | Admitting: Family Medicine

## 2022-03-28 ENCOUNTER — Other Ambulatory Visit (HOSPITAL_COMMUNITY): Payer: Self-pay

## 2022-03-28 VITALS — BP 142/78 | HR 76 | Temp 98.2°F | Ht 69.0 in | Wt 233.2 lb

## 2022-03-28 DIAGNOSIS — E1121 Type 2 diabetes mellitus with diabetic nephropathy: Secondary | ICD-10-CM | POA: Diagnosis not present

## 2022-03-28 DIAGNOSIS — N1831 Chronic kidney disease, stage 3a: Secondary | ICD-10-CM

## 2022-03-28 DIAGNOSIS — I10 Essential (primary) hypertension: Secondary | ICD-10-CM

## 2022-03-28 MED ORDER — IRBESARTAN 75 MG PO TABS
75.0000 mg | ORAL_TABLET | Freq: Every day | ORAL | 0 refills | Status: DC
  Filled 2022-03-28: qty 30, 30d supply, fill #0

## 2022-03-28 MED ORDER — METFORMIN HCL 500 MG PO TABS
500.0000 mg | ORAL_TABLET | Freq: Two times a day (BID) | ORAL | 0 refills | Status: DC
  Filled 2022-03-28: qty 60, 30d supply, fill #0

## 2022-03-28 NOTE — Progress Notes (Unsigned)
OFFICE VISIT  03/29/2022  CC:  Chief Complaint  Patient presents with   Medical Management of Chronic Issues    hypertension    Patient is a 72 y.o. male who presents for 1 month follow-up uncontrolled hypertension. A/P as of last visit: "Hypertension, not not ideal control. Increase Lopressor to 100 mg twice daily.  Continue amlodipine 10 mg a day. Will consider trial of low-dose ACE or ARB if not controlled at f/u 1 mo. Of note, mineralocorticoid receptor antagonist contraindicated due to his history of hyperkalemia."  INTERIM HX: Reviewed home bps:  avg syst 138, avg diast 78, avg HR 72.  Glucs: fasting am-->203- 256 the last 10d. Currently on no glycemic medication.  ROS as above, plus--> no fevers, no CP, no SOB, no wheezing, no cough, no dizziness, no HAs, no rashes, no melena/hematochezia.  No polyuria or polydipsia.  No myalgias or arthralgias.  No focal weakness, paresthesias, or tremors.  No acute vision or hearing abnormalities.  No dysuria or unusual/new urinary urgency or frequency.  No n/v/d or abd pain.  No palpitations.    Past Medical History:  Diagnosis Date   AAA (abdominal aortic aneurysm) ([place]) 03/2014   repaired 2020   Ascending aortic aneurysm (HCC)    unrepaired. Stable at 4.3 cm on imaging by [facility] thoracic surg 10/2019 and 10/2020.   Bilateral renal cysts    simple (03/2014 MRI)   CAD (coronary artery disease)    Cholelithiases 2018   asymptomatic   Chronic diastolic heart failure (HCC)    Chronic renal insufficiency, stage 3 (moderate) ([place]) 2022   baseline sCr 1.3-1.5 (GFR 55)   COVID-19 virus infection 02/2020   sotrovimab infusion   Diabetes mellitus with complication ([place]) [id number]   A1c 6.8%. A1c 5.4% 11/2019   Diverticulosis    Gout    initially dx'd by diag arthrocentesis of elbow effusion in winter 2021, 3 episodes, all came after being started on chlorthalidone->I d/c'd chlorthal.   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  Improving +  off oxygen as of 3 mo s/p liver transplant.  Doing well/resolved as of 02/2020 [facility] pulm f/u->they'll continue to follow.   History of blood transfusion 2018 X 4 dates   Chronic GI blood loss of unknown location despite full GI endoscopic eval, etc   History of liver transplant ([place]) 07/2019   [facility]   Hyperlipemia, mixed    elevated LFTs when on statins.     Hypertension    Cr bump 04/01/16 so I changed benicar-hct to benicar plain and added amlodipine 5 mg.   Increased prostate specific antigen (PSA) velocity 2021   0.11 to 4.77 from 2020 to 2021 (he got a liver transplant and was put on antirejection meds between these psa checks).   Iron deficiency anemia 05/2016   Acute blood loss anemia: hospitalized, required transfusion x 3 U: colonoscopy and capsule study unrevealing.  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  W/u for ? hemolytic anemia to be pursued by hematologist as outpt.  Dr. [name], GI at [facility] following, too---he rec'd onc do bone marrow bx as of Jan 2019   Iron deficiency anemia due to chronic blood loss 2018/19   GI: transfusions x >20 required; multiple endoscopies and bleeding scans unrevealing. Lysteda and octreotide + monthly iron infusions as of 04/2017. Iron infusions changed to every other month as of 09/2018 hem f/u.   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  Changes noted on all CXRs in 2018/2019.  Liver Transplant eval 03/2018->mild restriction on PFTs but no obstruction.  See further details in PMH "pulm fibrosis" section   Microscopic hematuria    Eval unremarkable by Dr. Eulogio Ditch.   Nonmelanoma skin cancer 08/15/2018   2020  BCC nose, excised.  2022 R side of neck->mohs   Obesity    Pulmonary fibrosis (HCC)    PFTs: restrictive lung dz ([facility] transplant eval 05/2018). [facility] pulm eval felt this was likely hepatopulmonary syndrome->causing his hypoxia->low likelihood of progression (as of 10/04/18 transplant clinic f/u). Pulm rehab helping as of 2020/2021. OFF oxygen as of 10/2019 transp f/u.   Spleen enlarged     Past Surgical History:  Procedure Laterality Date   ABDOMINAL AORTIC ANEURYSM REPAIR  08/23/2018   [facility]   CARDIAC CATHETERIZATION     CARDIOVASCULAR STRESS TEST  01/17/2012; 05/04/16   Normal stress nuclear study x 2 (2018--Normal perfusion. LVEF 66% with normal wall motion. This is a low risk study).   CERVICAL DISCECTOMY  1992   COLONOSCOPY N/A 05/27/2016   No site/explanation for blood loss found.  Erythematous mucosa in cecum and ascending colon--Cecal bx: normal.  Procedure: COLONOSCOPY;  Surgeon: Carol Ada, MD;  Location: [facility] ENDOSCOPY;  Service: Endoscopy;  Laterality: N/A;   COLONOSCOPY     01/07/22 diverticulosis but NO POLYPS.  No further recommended   COLONOSCOPY W/ POLYPECTOMY  09/10/2014   Polypectomy x 2: recall 5 yrs (Dr. [name]).   COLONOSCOPY WITH PROPOFOL N/A 01/07/2022   Procedure: COLONOSCOPY WITH PROPOFOL;  Surgeon: Carol Ada, MD;  Location: [facility] ENDOSCOPY;  Service: Gastroenterology;  Laterality: N/A;   CORONARY ANGIOPLASTY     CORONARY ARTERY BYPASS GRAFT  03/2006   CT ABDOMEN WITH CONTRAST ([place] [facility])  11/2018   CT CHEST WWO CONTAST ([place] [facility])  07/11/2019   No significant change in interstitial lung disease with possible underlying hepatopulmonary syndrome, Increased ascites in the setting of cirrhosis, tiny new right basilar nodule   DEXA  03/07/2019   T score 0.2/NORMAL   ENTEROSCOPY N/A 07/31/2016   Procedure: ENTEROSCOPY;  Surgeon: Ladene Artist, MD;  Location: [facility] ENDOSCOPY;  Service: Endoscopy;  Laterality: N/A;   ENTEROSCOPY N/A 12/02/2016   Procedure: ENTEROSCOPY;   Surgeon: Carol Ada, MD;  Location: [facility] ENDOSCOPY;  Service: Endoscopy;  Laterality: N/A;   ESOPHAGOGASTRODUODENOSCOPY N/A 05/25/2016   No site/explanation for blood loss found.  Procedure: ESOPHAGOGASTRODUODENOSCOPY (EGD);  Surgeon: Juanita Craver, MD;  Location: [facility] ENDOSCOPY;  Service: Endoscopy;  Laterality: N/A;   ESOPHAGOGASTRODUODENOSCOPY N/A 09/23/2016   Procedure: ESOPHAGOGASTRODUODENOSCOPY (EGD);  Surgeon: Carol Ada, MD;  Location: [place];  Service: Endoscopy;  Laterality: N/A;   ESOPHAGOGASTRODUODENOSCOPY (EGD) WITH PROPOFOL N/A 08/21/2018   Procedure: ESOPHAGOGASTRODUODENOSCOPY (EGD) WITH PROPOFOL;  Surgeon: Carol Ada, MD;  Location: [facility] ENDOSCOPY;  Service: Endoscopy;  Laterality: N/A;   GIVENS CAPSULE STUDY N/A 05/27/2016   No source identified.  Repeat 10/2016--results pending.  Procedure: GIVENS CAPSULE STUDY;  Surgeon: Carol Ada, MD;  Location: [facility] ENDOSCOPY;  Service: Endoscopy;  Laterality: N/A;   GIVENS CAPSULE STUDY N/A 10/15/2016   Procedure: GIVENS CAPSULE STUDY;  Surgeon: Carol Ada, MD;  Location: [facility] ENDOSCOPY;  Service: Endoscopy;  Laterality:  N/A;   GIVENS CAPSULE STUDY N/A 02/13/2017   Procedure: GIVENS CAPSULE STUDY;  Surgeon: Carol Ada, MD;  Location: [place];  Service: Endoscopy;  Laterality: N/A;   LIVER TRANSPLANT  08/02/2019   For NASH cirrhosis. [place]   NM GI BLOOD LOSS  01/27/2017   NEGATIVE   SMALL BOWEL ENTEROSCOPY  10/2016   ([facility], Dr. [name]: Double balloon enteroscopy--to the level of proximal ileum) jejunal polyps- which were removed--not bleeding & benign.  No source of bleeding was identified.   TRANSTHORACIC ECHOCARDIOGRAM  05/25/2016; 10/2017   05/2016: EF 60%, normal wall motion, grd II DD, mild aortic stenosis, dilated aortic root and ascending aorta. 10/2017: Mild LVH; no mention made of LV function.  Moderate aorticstenosis with mean gradient 19 peak gradient 41. AVA 1.1 cm^2.  05/2018 EF 55%,4.3 cm ascend  aneur, mild AS. 10/2020 EF 60-65%, normal diast fxn, mod AV sclerosis w/out sten/regurg.   VASECTOMY  1977    Outpatient Medications Prior to Visit  Medication Sig Dispense Refill   amLODipine (NORVASC) 10 MG tablet Take 1 tablet (10 mg total) by mouth daily. 90 tablet 3   ammonium lactate (AMLACTIN) 12 % cream Apply 2 times daily to forearms and hands in preparation for treatment with Efudex (fluorouracil) 385 g 99   aspirin EC 81 MG tablet Take 81 mg by mouth daily. Swallow whole.     azelastine (ASTELIN) 0.1 % nasal spray Place 2 sprays into both nostrils 2 (two) times daily 30 mL 12   furosemide (LASIX) 20 MG tablet Take 1 tablet (20 mg total) by mouth daily. 90 tablet 3   Magnesium 400 MG TABS Take 400 mg by mouth daily.     metoprolol tartrate (LOPRESSOR) 100 MG tablet Take 1 tablet (100 mg total) by mouth 2 (two) times daily. 180 tablet 3   mycophenolate (CELLCEPT) 250 MG capsule Take 2 capsules by mouth  in the morning and 1 capsule in the evening (Patient taking differently: Take by mouth. 2 capsule by mouth in the morning and 2 capsule in the evening.) 270 capsule 3   mycophenolate (CELLCEPT) 250 MG capsule Take 2 capsules by mouth in the morning and 2 capsules in the evening 360 capsule 3   pantoprazole (PROTONIX) 40 MG tablet Take 1 tablet (40 mg total) by mouth once daily 90 tablet 3   rosuvastatin (CRESTOR) 10 MG tablet Take 1 tablet (10 mg total) by mouth daily. (Patient taking differently: Take 10 mg by mouth at bedtime.) 90 tablet 3   sodium zirconium cyclosilicate (LOKELMA) 10 g PACK packet Take 10 g (1 packet total) by mouth every Monday, Wednesday and Friday. Space 2 hours apart from Tacrolimus. 12 packet 6   tacrolimus (PROGRAF) 1 MG capsule Take 2 capsules (2 mg total) by mouth every morning AND 2 capsules (2 mg total) every evening. 360 capsule 3   tacrolimus (PROGRAF) 1 MG capsule Take 2 capsules (2 mg total) by mouth every morning AND 2 capsules (2 mg total) every evening.  360 capsule 3   triamcinolone cream (KENALOG) 0.1 % Apply 2 times a day to rash. Follow with CeraVe Cream.  Stop when smooth. (Patient taking differently: Apply 1 Application topically daily as needed (Rash).) 80 g 2   tacrolimus (PROGRAF) 1 MG capsule Take 2 capsules (2 mg total) by mouth every morning and 2 capsules (2 mg total) every evening. (Patient taking differently: Take 1-2 mg by mouth See admin instructions. Take 2  mg in the morning and 1 mg at bedtime) 360 capsule 3   Facility-Administered Medications Prior to Visit  Medication Dose Route Frequency Provider Last Rate Last Admin   heparin lock flush 100 unit/mL  500 Units Intracatheter Daily PRN Truitt Merle, MD       sodium chloride flush (NS) 0.9 % injection 10 mL  10 mL Intracatheter PRN Truitt Merle, MD        Allergies  Allergen Reactions   Niacinamide Itching    Improved upon discontinuation    Thiazide-Type Diuretics Other (See Comments)    Chlorthalidone caused gout   Quinolones Other (See Comments)    CONTRAINDICATED D/T PT HAVING AAA    Review of Systems As per HPI  PE:    03/28/2022    1:39 PM 03/28/2022    1:14 PM 02/28/2022    8:32 AM  Vitals with BMI  Height  5' 9"$  5' 9"$   Weight  233 lbs 3 oz 237 lbs 6 oz  BMI  99991111 [id number]  Systolic [id number]  Diastolic 78 81 74  Pulse  76 71   148/81 142/78  Physical Exam  Gen: Alert, well appearing.  Patient is oriented to person, place, time, and situation. AFFECT: pleasant, lucid thought and speech. Lower extremities show 1+ edema bilaterally.  LABS:  Last CBC Lab Results  Component Value Date   WBC 12.0 (H) 02/25/2022   HGB 14.8 02/25/2022   HCT 43.5 02/25/2022   MCV 84.3 02/25/2022   MCH 28.7 02/25/2022   RDW 16.3 (H) 02/25/2022   PLT 345 [id number]   Last metabolic panel Lab Results  Component Value Date   GLUCOSE 149 (H) 02/25/2022   NA 136 02/25/2022   K 4.2 02/25/2022   CL 101 02/25/2022   CO2 28 02/25/2022   BUN 11 02/25/2022   CREATININE  1.24 02/25/2022   GFRNONAA >60 02/25/2022   CALCIUM 9.7 02/25/2022   PHOS 2.9 07/29/2016   PROT 7.2 02/25/2022   ALBUMIN 4.3 02/25/2022   LABGLOB 2.4 10/29/2021   AGRATIO 2.0 10/29/2021   BILITOT 1.0 02/25/2022   ALKPHOS 119 02/25/2022   AST 15 02/25/2022   ALT 18 02/25/2022   ANIONGAP 7 02/25/2022   Last hemoglobin A1c Lab Results  Component Value Date   HGBA1C 6.3 (A) 01/13/2022   HGBA1C 6.3 01/13/2022   HGBA1C 6.3 01/13/2022   HGBA1C 6.3 01/13/2022   IMPRESSION AND PLAN:  #1 uncontrolled hypertension in the setting of chronic renal insufficiency stage III. Will cautiously add a irbesartan 75 mg a day. He is getting bloodwork at his [place] tomorrow and I will check electrolytes and creatinine when I see him back in 1 week. Continue amlodipine 10 mg a day and Lopressor 100 mg twice daily.  #2 diabetes with nephropathy. Significant hyperglycemia lately, although hemoglobin A1c little over 2 months ago was 6.3 Has been on metformin in the past. Will restart metformin 500 mg twice a day.  If GFR gets consistently <45 then we'll have to change this.  An After Visit Summary was printed and given to the patient.  FOLLOW UP: Return in about 1 week (around 04/04/2022) for f/u HTN/bmet. Has appt w/me 4/8  Signed:  Crissie Sickles, MD           03/29/2022

## 2022-03-29 DIAGNOSIS — D849 Immunodeficiency, unspecified: Secondary | ICD-10-CM | POA: Diagnosis not present

## 2022-03-29 DIAGNOSIS — Z944 Liver transplant status: Secondary | ICD-10-CM | POA: Diagnosis not present

## 2022-03-29 DIAGNOSIS — Z940 Kidney transplant status: Secondary | ICD-10-CM | POA: Diagnosis not present

## 2022-03-29 LAB — BASIC METABOLIC PANEL
BUN: 12 (ref 4–21)
CO2: 23 — AB (ref 13–22)
Chloride: 97 — AB (ref 99–108)
Creatinine: 1.4 — AB (ref 0.6–1.3)
Glucose: 247
Potassium: 5.1 mEq/L (ref 3.5–5.1)
Sodium: 137 (ref 137–147)

## 2022-03-29 LAB — COMPREHENSIVE METABOLIC PANEL
Albumin: 4.6 (ref 3.5–5.0)
Calcium: 10 (ref 8.7–10.7)
Globulin: 2.7
eGFR: 53

## 2022-03-29 LAB — HEPATIC FUNCTION PANEL
ALT: 15 U/L (ref 10–40)
AST: 13 — AB (ref 14–40)
Alkaline Phosphatase: 153 — AB (ref 25–125)
Bilirubin, Total: 1.1

## 2022-03-29 LAB — CBC AND DIFFERENTIAL
HCT: 46 (ref 41–53)
Hemoglobin: 14.7 (ref 13.5–17.5)
Neutrophils Absolute: 6.4
Platelets: 372 10*3/uL (ref 150–400)
WBC: 9.7

## 2022-03-29 LAB — CBC: RBC: 5.33 — AB (ref 3.87–5.11)

## 2022-03-30 NOTE — Progress Notes (Signed)
Cardiology Clinic Note   Patient Name: William [name] Date of Encounter: 03/31/2022  Primary Care Provider:  Tammi Sou, MD Primary Cardiologist:  Shelva Majestic, MD  Patient Profile    William [name] 72 year old male presents to clinic today for follow-up evaluation of his coronary artery disease and chronic diastolic CHF.   Past Medical History    Past Medical History:  Diagnosis Date   AAA (abdominal aortic aneurysm) ([place]) 03/2014   repaired 2020   Ascending aortic aneurysm (HCC)    unrepaired. Stable at 4.3 cm on imaging by [facility] thoracic surg 10/2019 and 10/2020.   Bilateral renal cysts    simple (03/2014 MRI)   CAD (coronary artery disease)    Cholelithiases 2018   asymptomatic   Chronic diastolic heart failure (HCC)    Chronic renal insufficiency, stage 3 (moderate) ([place]) 2022   baseline sCr 1.3-1.5 (GFR 55)   COVID-19 virus infection 02/2020   sotrovimab infusion   Diabetes mellitus with complication ([place]) [id number]   A1c 6.8%. A1c 5.4% 11/2019   Diverticulosis    Gout    initially dx'd by diag arthrocentesis of elbow effusion in winter 2021, 3 episodes, all came after being started on chlorthalidone->I d/c'd chlorthal.   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  Improving + off oxygen as of 3 mo s/p liver transplant.  Doing well/resolved as of 02/2020 [facility] pulm f/u->they'll continue to follow.   History of blood transfusion 2018 X 4 dates   Chronic GI blood loss of unknown location despite full GI endoscopic eval, etc   History of liver transplant ([place]) 07/2019   [facility]   Hyperlipemia, mixed    elevated LFTs when on statins.     Hypertension    Cr bump 04/01/16 so I changed benicar-hct to benicar plain and added amlodipine 5 mg.   Increased prostate specific antigen (PSA) velocity 2021   0.11 to 4.77 from 2020 to 2021 (he got a liver transplant and was put on antirejection meds between these psa checks).   Iron deficiency anemia 05/2016   Acute blood  loss anemia: hospitalized, required transfusion x 3 U: colonoscopy and capsule study unrevealing.  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  W/u for ? hemolytic anemia to be pursued by hematologist as outpt.  Dr. [name], GI at [facility] following, too---he rec'd onc do bone marrow bx as of Jan 2019   Iron deficiency anemia due to chronic blood loss 2018/19   GI: transfusions x >20 required; multiple endoscopies and bleeding scans unrevealing. Lysteda and octreotide + monthly iron infusions as of 04/2017. Iron infusions changed to every other month as of 09/2018 hem f/u.   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  Changes noted on all CXRs in 2018/2019.  Liver Transplant eval 03/2018->mild restriction on PFTs but no obstruction.  See further details in PMH "pulm fibrosis" section   Microscopic hematuria    Eval unremarkable by Dr. Eulogio Ditch.   Nonmelanoma skin cancer 08/15/2018   2020 BCC nose, excised.  2022 R side of neck->mohs   Obesity    Pulmonary fibrosis (HCC)    PFTs: restrictive lung  dz ([facility] transplant eval 05/2018). [facility] pulm eval felt this was likely hepatopulmonary syndrome->causing his hypoxia->low likelihood of progression (as of 10/04/18 transplant clinic f/u). Pulm rehab helping as of 2020/2021. OFF oxygen as of 10/2019 transp f/u.   Spleen enlarged    Past Surgical History:  Procedure Laterality Date   ABDOMINAL AORTIC ANEURYSM REPAIR  08/23/2018   [facility]   CARDIAC CATHETERIZATION     CARDIOVASCULAR STRESS TEST  01/17/2012; 05/04/16   Normal stress nuclear study x 2 (2018--Normal perfusion. LVEF 66% with normal wall motion. This is a low  risk study).   CERVICAL DISCECTOMY  1992   COLONOSCOPY N/A 05/27/2016   No site/explanation for blood loss found.  Erythematous mucosa in cecum and ascending colon--Cecal bx: normal.  Procedure: COLONOSCOPY;  Surgeon: Carol Ada, MD;  Location: [facility] ENDOSCOPY;  Service: Endoscopy;  Laterality: N/A;   COLONOSCOPY     01/07/22 diverticulosis but NO POLYPS.  No further recommended   COLONOSCOPY W/ POLYPECTOMY  09/10/2014   Polypectomy x 2: recall 5 yrs (Dr. [name]).   COLONOSCOPY WITH PROPOFOL N/A 01/07/2022   Procedure: COLONOSCOPY WITH PROPOFOL;  Surgeon: Carol Ada, MD;  Location: [facility] ENDOSCOPY;  Service: Gastroenterology;  Laterality: N/A;   CORONARY ANGIOPLASTY     CORONARY ARTERY BYPASS GRAFT  03/2006   CT ABDOMEN WITH CONTRAST ([place] [facility])  11/2018   CT CHEST WWO CONTAST ([place] [facility]X)  07/11/2019   No significant change in interstitial lung disease with possible underlying hepatopulmonary syndrome, Increased ascites in the setting of cirrhosis, tiny new right basilar nodule   DEXA  03/07/2019   T score 0.2/NORMAL   ENTEROSCOPY N/A 07/31/2016   Procedure: ENTEROSCOPY;  Surgeon: Ladene Artist, MD;  Location: [facility] ENDOSCOPY;  Service: Endoscopy;  Laterality: N/A;   ENTEROSCOPY N/A 12/02/2016   Procedure: ENTEROSCOPY;  Surgeon: Carol Ada, MD;  Location: [facility] ENDOSCOPY;  Service: Endoscopy;  Laterality: N/A;   ESOPHAGOGASTRODUODENOSCOPY N/A 05/25/2016   No site/explanation for blood loss found.  Procedure: ESOPHAGOGASTRODUODENOSCOPY (EGD);  Surgeon: Juanita Craver, MD;  Location: [facility] ENDOSCOPY;  Service: Endoscopy;  Laterality: N/A;   ESOPHAGOGASTRODUODENOSCOPY N/A 09/23/2016   Procedure: ESOPHAGOGASTRODUODENOSCOPY (EGD);  Surgeon: Carol Ada, MD;  Location: [place];  Service: Endoscopy;  Laterality: N/A;   ESOPHAGOGASTRODUODENOSCOPY (EGD) WITH PROPOFOL N/A 08/21/2018   Procedure: ESOPHAGOGASTRODUODENOSCOPY (EGD) WITH PROPOFOL;  Surgeon: Carol Ada, MD;  Location: [facility] ENDOSCOPY;  Service:  Endoscopy;  Laterality: N/A;   GIVENS CAPSULE STUDY N/A 05/27/2016   No source identified.  Repeat 10/2016--results pending.  Procedure: GIVENS CAPSULE STUDY;  Surgeon: Carol Ada, MD;  Location: [facility] ENDOSCOPY;  Service: Endoscopy;  Laterality: N/A;   GIVENS CAPSULE STUDY N/A 10/15/2016   Procedure: GIVENS CAPSULE STUDY;  Surgeon: Carol Ada, MD;  Location: [facility] ENDOSCOPY;  Service: Endoscopy;  Laterality: N/A;   GIVENS CAPSULE STUDY N/A 02/13/2017   Procedure: GIVENS CAPSULE STUDY;  Surgeon: Carol Ada, MD;  Location: [place];  Service: Endoscopy;  Laterality: N/A;   LIVER TRANSPLANT  08/02/2019   For NASH cirrhosis. [place]   NM GI BLOOD LOSS  01/27/2017   NEGATIVE   SMALL BOWEL ENTEROSCOPY  10/2016   ([facility], Dr. [name]: Double balloon enteroscopy--to the level of proximal ileum) jejunal polyps- which were removed--not bleeding & benign.  No source of bleeding was identified.   TRANSTHORACIC ECHOCARDIOGRAM  05/25/2016; 10/2017   05/2016: EF 60%, normal wall motion, grd II DD, mild aortic stenosis, dilated aortic root and ascending aorta. 10/2017:  Mild LVH; no mention made of LV function.  Moderate aorticstenosis with mean gradient 19 peak gradient 41. AVA 1.1 cm^2.  05/2018 EF 55%,4.3 cm ascend aneur, mild AS. 10/2020 EF 60-65%, normal diast fxn, mod AV sclerosis w/out sten/regurg.   VASECTOMY  1977    Allergies  Allergies  Allergen Reactions   Niacinamide Itching    Improved upon discontinuation    Thiazide-Type Diuretics Other (See Comments)    Chlorthalidone caused gout   Quinolones Other (See Comments)    CONTRAINDICATED D/T PT HAVING AAA    History of Present Illness    William [name] has a PMH of coronary artery disease status post CABG (LIMA-LAD, SVG-RCA, SVG-circumflex 2/08), obesity, metabolic syndrome, mixed hyperlipidemia, and AAA status post endovascular aneurysm repair 8/20.  His PMH also includes moderate aortic stenosis, CKD stage III, and liver  transplant.  Echocardiogram 9/22 showed an EF of 60 to 65%, no regional wall motion abnormalities, normal RV function, trivial MR, trivial AI.   He developed pneumonia 2/20 and his bilirubin further increased to 9-10.  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  August 2020 he underwent EVAR for abdominal aortic aneurysm at [facility].  He was followed by [facility] and underwent liver transplant 6/21.  He did well following his transplant and was continued on prednisone and tacrolimus for immunosuppression.   Followed up with Dr. Claiborne Billings 12/30/2019 and denied shortness of breath and chest discomfort.  He stated that his blood pressure at home was typically running in the [id number] range systolic.  He reported that his creatinine had increased to 4 after his liver transplant and decreased to around 1.31.  His blood pressure was 152/90 with a pulse of 75.  His EKG showed normal sinus rhythm with incomplete right bundle branch block.  His chlorthalidone was restarted.  His prior echocardiogram showed moderate aortic valve stenosis.  He denied presyncope and syncope or exertional dyspnea.  Follow-up was planned for 6 months.   He presented to the clinic 09/23/2020 for follow-up evaluation stated he felt well.  He had noticed some increased lower extremity swelling over the last several months.  His weight had increased about 8 pounds since May.  He reported that he did try to limit his salt intake and was drinking around 4 bottles of water per day.  He noticed that his leg swelling would decreased when he woke up in the morning and would accumulate through the day.  He remained somewhat physically active around his house but reported that he was not as physically active as he was before he had gout and COVID.  He had seen orthopedics who told him he also had arthritis.  He denied recurrence of chest discomfort and shortness of breath.  He denied orthopnea and PND.  I prescribed furosemide 20 mg x 3 days with plan for  discontinuing after dosing.    Ordered a BMP  1 week after.  Ordered an echocardiogram, gave the salty 6 diet sheet, I asked him to restrict his fluids to less than 64 ounces and follow-up in 1 to 2 months.  His echocardiogram showed an EF 60-65%, trivial mitral valve regurgitation, and moderate aortic valve calcification with trivial regurgitation.   He presented to the clinic 11/13/20 for follow-up evaluation stated he felt well.  He had been using his chainsaw and been more active around his property.  He reported he was also walking more and going up and down stairs more.  We reviewed his echocardiogram.  He  reported a [facility]  cardiac MRI which was  normal.  He had been trying to avoid salt in his diet.  He was using lower extremity support stockings.  I gave him a salty 6 diet sheet, had him continue his lower extremity support stockings, increase his physical activity as tolerated and plan follow-up for 6 to 9 months.  He was evaluated by Richardson Dopp, PA-C on 10/05/2021 for preoperative cardiac risk assessment.  He denied chest pain, shortness of breath, syncope, orthopnea and lower extremity swelling.  He was able to complete greater than 4 METS of physical activity and his RCRI was noted to be 6.6%.  He presents to the clinic today for follow-up evaluation and states he feels well.  He presents with his wife.  His blood pressure is well-controlled today at 125/79.  He reports that his blood pressure is well-controlled at home.  He continues to follow with [facility] vascular surgery.  He will have cardiac MRI in March and follow-up with [facility] in May.  He occasionally does note some swelling in his ankles.  It is gone in the morning when he wakes up.  He underwent colonoscopy with Dr. [name] December 1.  He continues to be physically active and cut firewood.  We reviewed the importance of maintaining his physical activity and heart healthy low-sodium diet.  I will plan follow-up in 9 to 12 months.   Today he  denies chest pain, shortness of breath, lower extremity edema, fatigue, palpitations, melena, hematuria, hemoptysis, diaphoresis, weakness, presyncope, syncope, orthopnea, and PND.    Home Medications    Prior to Admission medications   Medication Sig Start Date End Date Taking? Authorizing Provider  amLODipine (NORVASC) 10 MG tablet Take 1 tablet (10 mg total) by mouth daily. 03/03/22   McGowen, Adrian Blackwater, MD  ammonium lactate (AMLACTIN) 12 % cream Apply 2 times daily to forearms and hands in preparation for treatment with Efudex (fluorouracil) 02/10/22     aspirin EC 81 MG tablet Take 81 mg by mouth daily. Swallow whole.    [provider]  azelastine (ASTELIN) 0.1 % nasal spray Place 2 sprays into both nostrils 2 (two) times daily 01/13/22   McGowen, Adrian Blackwater, MD  furosemide (LASIX) 20 MG tablet Take 1 tablet (20 mg total) by mouth daily. 03/03/22   McGowen, Adrian Blackwater, MD  irbesartan (AVAPRO) 75 MG tablet Take 1 tablet (75 mg total) by mouth daily. 03/28/22   McGowen, Adrian Blackwater, MD  Magnesium 400 MG TABS Take 400 mg by mouth daily.    [provider]  metFORMIN (GLUCOPHAGE) 500 MG tablet Take 1 tablet (500 mg total) by mouth 2 (two) times daily with a meal. 03/28/22   McGowen, Adrian Blackwater, MD  metoprolol tartrate (LOPRESSOR) 100 MG tablet Take 1 tablet (100 mg total) by mouth 2 (two) times daily. 02/28/22   McGowen, Adrian Blackwater, MD  mycophenolate (CELLCEPT) 250 MG capsule Take 2 capsules by mouth  in the morning and 1 capsule in the evening Patient taking differently: Take by mouth. 2 capsule by mouth in the morning and 2 capsule in the evening. 11/03/21     mycophenolate (CELLCEPT) 250 MG capsule Take 2 capsules by mouth in the morning and 2 capsules in the evening 03/08/22     pantoprazole (PROTONIX) 40 MG tablet Take 1 tablet (40 mg total) by mouth once daily 10/04/21     rosuvastatin (CRESTOR) 10 MG tablet Take 1 tablet (10 mg total) by mouth daily. Patient taking  differently: Take 10 mg  by mouth at bedtime. 09/01/21 08/27/22  Troy Sine, MD  sodium zirconium cyclosilicate (LOKELMA) 10 g PACK packet Take 10 g (1 packet total) by mouth every Monday, Wednesday and Friday. Space 2 hours apart from Tacrolimus. 11/23/21     tacrolimus (PROGRAF) 1 MG capsule Take 2 capsules (2 mg total) by mouth every morning AND 2 capsules (2 mg total) every evening. 03/24/22     tacrolimus (PROGRAF) 1 MG capsule Take 2 capsules (2 mg total) by mouth every morning AND 2 capsules (2 mg total) every evening. 03/24/22     triamcinolone cream (KENALOG) 0.1 % Apply 2 times a day to rash. Follow with CeraVe Cream.  Stop when smooth. Patient taking differently: Apply 1 Application topically daily as needed (Rash). 04/08/21       Family History    Family History  Problem Relation Age of Onset   Hypertension Mother    Cancer - Other Mother        liver cancer   Heart disease Father    Heart attack Father    Cancer - Lung Father    Diabetes Father    Liver disease Sister    Anemia Sister    Heart disease Sister    Heart attack Sister    Heart disease Brother        CABG 61 YEARS AGO   Hypertension Brother    Mesothelioma Brother        HALF-BROTHER   Cancer - Other Brother        CLL   Cancer - Lung Brother        Mets to brain   Cancer - Lung Sister        HALF-SISTER   Liver disease Brother    Heart disease Brother        CABG-2012   Emphysema Maternal Grandfather    Cancer - Other Paternal Grandmother        Stomach   Heart attack Paternal Grandfather    He indicated that his mother is deceased. He indicated that his father is deceased. He indicated that two of his three sisters are alive. He indicated that all of his four brothers are deceased. He indicated that his maternal grandmother is deceased. He indicated that his maternal grandfather is deceased. He indicated that his paternal grandmother is deceased. He indicated that his paternal grandfather is deceased.  Social History     Social History   Socioeconomic History   Marital status: Married    Spouse name: Not on file   Number of children: Not on file   Years of education: Not on file   Highest education level: 10th grade  Occupational History   Not on file  Tobacco Use   Smoking status: Former    Types: Cigarettes   Smokeless tobacco: Never   Tobacco comments:    QUIT I 1983  Vaping Use   Vaping Use: Never used  Substance and Sexual Activity   Alcohol use: No   Drug use: No   Sexual activity: Yes  Other Topics Concern   Not on file  Social History Narrative   Married, 3 grown children, 3 GCs.   Educ: 10th grade.   Occupation: Retired Brewing technologist.   Tob: quit 1983, smoked about 20 pack-yr hx prior.   No alcohol.   Social Determinants of Health   Financial Resource Strain: Low Risk  (07/21/2021)   Overall Financial Resource Strain (CARDIA)  Difficulty of Paying Living Expenses: Not hard at all  Food Insecurity: No Food Insecurity (07/21/2021)   Hunger Vital Sign    Worried About Running Out of Food in the Last Year: Never true    Ran Out of Food in the Last Year: Never true  Transportation Needs: No Transportation Needs (07/21/2021)   PRAPARE - Hydrologist (Medical): No    Lack of Transportation (Non-Medical): No  Physical Activity: Inactive (07/21/2021)   Exercise Vital Sign    Days of Exercise per Week: 0 days    Minutes of Exercise per Session: 0 min  Stress: No Stress Concern Present (07/21/2021)   [place]    Feeling of Stress : Not at all  Social Connections: Moderately Integrated (07/21/2021)   Social Connection and Isolation Panel [NHANES]    Frequency of Communication with Friends and Family: More than three times a week    Frequency of Social Gatherings with Friends and Family: More than three times a week    Attends Religious Services: 1 to 4 times per year    Active Member of  [employer] or Organizations: No    Attends Archivist Meetings: Never    Marital Status: Married  Human resources officer Violence: Not At Risk (07/21/2021)   Humiliation, Afraid, Rape, and Kick questionnaire    Fear of Current or Ex-Partner: No    Emotionally Abused: No    Physically Abused: No    Sexually Abused: No     Review of Systems    General:  No chills, fever, night sweats or weight changes.  Cardiovascular:  No chest pain, dyspnea on exertion, edema, orthopnea, palpitations, paroxysmal nocturnal dyspnea. Dermatological: No rash, lesions/masses Respiratory: No cough, dyspnea Urologic: No hematuria, dysuria Abdominal:   No nausea, vomiting, diarrhea, bright red blood per rectum, melena, or hematemesis Neurologic:  No visual changes, wkns, changes in mental status. All other systems reviewed and are otherwise negative except as noted above.  Physical Exam    VS:  BP 125/79   Pulse 65   Ht 5' 11"$  (1.803 m)   Wt 231 lb (104.8 kg)   BMI 32.22 kg/m  , BMI Body mass index is 32.22 kg/m. GEN: Well nourished, well developed, in no acute distress. HEENT: normal. Neck: Supple, no JVD, carotid bruits, or masses. Cardiac: RRR, 3/6 systolic murmur heard along right sternal border , rubs, or gallops. No clubbing, cyanosis, edema.  Radials/DP/PT 2+ and equal bilaterally.  Respiratory:  Respirations regular and unlabored, clear to auscultation bilaterally. GI: Soft, nontender, nondistended, BS + x 4. MS: no deformity or atrophy. Skin: warm and dry, no rash. Neuro:  Strength and sensation are intact. Psych: Normal affect.  Accessory Clinical Findings    Recent Labs: 02/25/2022: ALT 18; BUN 11; Creatinine 1.24; Hemoglobin 14.8; Platelet Count 345; Potassium 4.2; Sodium 136   Recent Lipid Panel    Component Value Date/Time   CHOL 114 01/13/2022 0907   CHOL 118 10/29/2021 0807   CHOL 135 11/29/2013 0940   TRIG 179.0 (H) 01/13/2022 0907   TRIG 177 (H) 11/29/2013 0940   HDL  25.60 (L) 01/13/2022 0907   HDL 26 (L) 10/29/2021 0807   HDL 34 (L) 11/29/2013 0940   CHOLHDL 4 01/13/2022 0907   VLDL 35.8 01/13/2022 0907   LDLCALC 53 01/13/2022 0907   LDLCALC 65 10/29/2021 0807   LDLCALC 66 11/29/2013 0940   LDLDIRECT 162.0 01/13/2021 0933  ECG personally reviewed by me today-sinus rhythm with first-degree AV block left axis deviation septal infarct undetermined age 80 bpm  EKG 09/23/2020 Sinus rhythm first-degree AV block incomplete right bundle branch block anterior septal infarct undetermined age 54 bpm- No acute changes   Nuclear stress test 05/04/2016 The left ventricular ejection fraction is hyperdynamic (>65%). Nuclear stress EF: 66%. No T wave inversion was noted during stress. There was no ST segment deviation noted during stress. This is a low risk study.   Normal perfusion. LVEF 66% with normal wall motion. This is a low risk study.     Echocardiogram 10/11/2017 Study Conclusions   - Left ventricle: Wall thickness was increased in a pattern of mild    LVH.  - Aortic valve: There was moderate stenosis. Valve area (VTI): 1.42    cm^2. Valve area (Vmax): 1.24 cm^2. Valve area (Vmean): 1.11    cm^2.  - Left atrium: The atrium was mildly dilated.  - Impressions: Mean gradient slightly lower than 2018.   Impressions:   - Mean gradient slightly lower than 2018.   -------------------------------------------------------------------  Labs, prior tests, procedures, and surgery:  Coronary artery bypass grafting.   -------------------------------------------------------------------  Study data:  Comparison was made to the study of 05/19/2016.  Study  status:  Routine.  Procedure:  The patient reported no pain pre or  post test. Transthoracic echocardiography. Image quality was  adequate.  Study completion:  There were no complications.  Echocardiography.  M-mode, complete 2D, spectral Doppler, and color  Doppler.  Birthdate:  Patient  birthdate: [birth year].  Age:  Patient  is 72 yr old.  Sex:  Gender: male.    BMI: 35.2 kg/m^2.  Blood  pressure:     129/76  Patient status:  Outpatient.  Study date:  Study date: 10/11/2017. Study time: 11:33 AM.  Location:  [facility]   -------------------------------------------------------------------   -------------------------------------------------------------------  Left ventricle:   Wall thickness was increased in a pattern of mild  LVH.   -------------------------------------------------------------------  Aortic valve:   Trileaflet; moderately thickened, moderately  calcified leaflets.  Doppler:   There was moderate stenosis.  VTI ratio of LVOT to aortic valve: 0.41. Valve area (VTI): 1.42  cm^2. Indexed valve area (VTI): 0.61 cm^2/m^2. Peak velocity ratio  of LVOT to aortic valve: 0.36. Valve area (Vmax): 1.24 cm^2.  Indexed valve area (Vmax): 0.54 cm^2/m^2. Mean velocity ratio of  LVOT to aortic valve: 0.32. Valve area (Vmean): 1.11 cm^2. Indexed  valve area (Vmean): 0.48 cm^2/m^2.    Mean gradient (S): 19 mm Hg.  Peak gradient (S): 41 mm Hg.   -------------------------------------------------------------------  Aorta:  Ascending aorta: The ascending aorta was mildly dilated.   -------------------------------------------------------------------  Mitral valve:   Mildly thickened leaflets .  Doppler:  There was  trivial regurgitation.    Valve area by pressure half-time: 3.38  cm^2. Indexed valve area by pressure half-time: 1.46 cm^2/m^2.  Peak gradient (D): 5 mm Hg.   -------------------------------------------------------------------  Left atrium:  The atrium was mildly dilated.   -------------------------------------------------------------------  Atrial septum:  Poorly visualized.   -------------------------------------------------------------------  Right ventricle:  The cavity size was normal. Wall thickness was  normal. Systolic function was normal.    -------------------------------------------------------------------  Pulmonic valve:    Doppler:  There was mild regurgitation.   -------------------------------------------------------------------  Tricuspid valve:   Doppler:  There was mild regurgitation.   -------------------------------------------------------------------  Right atrium:  The atrium was normal in size.   -------------------------------------------------------------------  Pericardium:  The pericardium  was normal in appearance.   -------------------------------------------------------------------  Systemic veins:  Inferior vena cava: The vessel was normal in size. The  respirophasic diameter changes were in the normal range (>= 50%),  consistent with normal central venous pressure.    Echocardiogram 10/21/2020 IMPRESSIONS     1. Left ventricular ejection fraction, by estimation, is 60 to 65%. The  left ventricle has normal function. The left ventricle has no regional  wall motion abnormalities. Left ventricular diastolic parameters were  normal.   2. Right ventricular systolic function is normal. The right ventricular  size is normal.   3. The mitral valve is grossly normal. Trivial mitral valve  regurgitation.   4. The aortic valve is calcified. There is moderate calcification of the  aortic valve. There is moderate thickening of the aortic valve. Aortic  valve regurgitation is trivial.  Assessment & Plan   1.  Chronic diastolic CHF-euvolemic.  NYHA class II.  Weight stable.  Follow-up 10/21/2020 echocardiogram showed normal LVEF, trivial mitral valve regurgitation, and trivial aortic valve regurgitation.   Continue irbesartan, metoprolol, irbesartan Heart healthy low-sodium diet-salty 6 reviewed Increase physical activity as tolerated Continue fluid restriction, daily weights, and lower extremity support stockings   Coronary artery disease-no chest pain today.    Status post CABG (LIMA-LAD, SVG-RCA,  SVG-circumflex 2/08).Underwent stress testing 3/18 which showed low risk and no ischemia. Continue aspirin, metoprolol, amlodipine Heart healthy low-sodium diet-salty 6 given Increase physical activity as tolerated   Essential hypertension-BP today 125/79.  Well-controlled at home. Continue  metoprolol, amlodipine, irbesartan Heart healthy low-sodium diet-salty 6 given Increase physical activity as tolerated   Moderate aortic stenosis-stable 3/6 systolic murmur.  Continues to be fairly physically active and denies recent increase in DOE or activity intolerance.  Echocardiogram showed normal LVEF with trivial mitral valve and aortic valve regurgitation.  Will have cardiac MRI in March   Ascending aortic dilation-denies episodes of chest or back discomfort/pain.  Blood pressure continues to be well controlled. Maintain blood pressure control   Abdominal aortic aneurysm-status post [facility] 8/20 Follows with [facility]   Disposition: Follow-up with Dr. Claiborne Billings in 9-12 months.   Jossie Ng. Ralf Konopka NP-C     03/31/2022, 10:19 AM [place] [address] Office [phone] Fax [phone]    I spent 14 minutes examining this patient, reviewing medications, and using patient centered shared decision making involving her cardiac care.  Prior to her visit I spent greater than 20 minutes reviewing her past medical history,  medications, and prior cardiac tests.

## 2022-03-31 ENCOUNTER — Ambulatory Visit: Payer: Medicare Other | Attending: General Practice | Admitting: General Practice

## 2022-03-31 ENCOUNTER — Other Ambulatory Visit: Payer: Self-pay

## 2022-03-31 ENCOUNTER — Encounter: Payer: Self-pay | Admitting: General Practice

## 2022-03-31 VITALS — BP 125/79 | HR 65 | Ht 71.0 in | Wt 231.0 lb

## 2022-03-31 DIAGNOSIS — I10 Essential (primary) hypertension: Secondary | ICD-10-CM | POA: Diagnosis not present

## 2022-03-31 DIAGNOSIS — I5032 Chronic diastolic (congestive) heart failure: Secondary | ICD-10-CM

## 2022-03-31 DIAGNOSIS — I350 Nonrheumatic aortic (valve) stenosis: Secondary | ICD-10-CM

## 2022-03-31 DIAGNOSIS — I2510 Atherosclerotic heart disease of native coronary artery without angina pectoris: Secondary | ICD-10-CM

## 2022-03-31 DIAGNOSIS — I77810 Thoracic aortic ectasia: Secondary | ICD-10-CM

## 2022-03-31 DIAGNOSIS — I714 Abdominal aortic aneurysm, without rupture, unspecified: Secondary | ICD-10-CM

## 2022-03-31 NOTE — Patient Instructions (Signed)
Medication Instructions:  The current medical regimen is effective;  continue present plan and medications as directed. Please refer to the Current Medication list given to you today.  *If you need a refill on your cardiac medications before your next appointment, please call your pharmacy*  Lab Work: NONE If you have labs (blood work) drawn today and your tests are completely normal, you will receive your results only by: [place] (if you have MyChart) OR  A paper copy in the mail If you have any lab test that is abnormal or we need to change your treatment, we will call you to review the results.  Testing/Procedures: NONE  Other Instructions MAINTAIN PHYSICAL ACTIVITY-AS TOLERATED   Follow-Up: At [facility], you and your health needs are our priority.  As part of our continuing mission to provide you with exceptional heart care, we have created designated Provider Care Teams.  These Care Teams include your primary Cardiologist (physician) and Advanced Practice Providers (APPs -  Physician Assistants and Nurse Practitioners) who all work together to provide you with the care you need, when you need it.  Your next appointment:   9-12 month(s)  Provider:   Shelva Majestic, MD

## 2022-04-01 ENCOUNTER — Encounter: Payer: Self-pay | Admitting: Family Medicine

## 2022-04-04 DIAGNOSIS — K760 Fatty (change of) liver, not elsewhere classified: Secondary | ICD-10-CM | POA: Diagnosis not present

## 2022-04-04 DIAGNOSIS — R7989 Other specified abnormal findings of blood chemistry: Secondary | ICD-10-CM | POA: Diagnosis not present

## 2022-04-04 DIAGNOSIS — Z944 Liver transplant status: Secondary | ICD-10-CM | POA: Diagnosis not present

## 2022-04-05 ENCOUNTER — Other Ambulatory Visit (HOSPITAL_COMMUNITY): Payer: Self-pay

## 2022-04-05 ENCOUNTER — Encounter: Payer: Self-pay | Admitting: Family Medicine

## 2022-04-05 ENCOUNTER — Ambulatory Visit (INDEPENDENT_AMBULATORY_CARE_PROVIDER_SITE_OTHER): Payer: Medicare Other | Admitting: Family Medicine

## 2022-04-05 VITALS — BP 117/67 | HR 62 | Temp 97.5°F | Ht 71.0 in | Wt 233.2 lb

## 2022-04-05 DIAGNOSIS — N1831 Chronic kidney disease, stage 3a: Secondary | ICD-10-CM

## 2022-04-05 DIAGNOSIS — E1121 Type 2 diabetes mellitus with diabetic nephropathy: Secondary | ICD-10-CM | POA: Diagnosis not present

## 2022-04-05 DIAGNOSIS — I10 Essential (primary) hypertension: Secondary | ICD-10-CM | POA: Diagnosis not present

## 2022-04-05 LAB — BASIC METABOLIC PANEL WITH GFR
BUN: 12 mg/dL (ref 6–23)
CO2: 29 meq/L (ref 19–32)
Calcium: 9.8 mg/dL (ref 8.4–10.5)
Chloride: 97 meq/L (ref 96–112)
Creatinine, Ser: 1.3 mg/dL (ref 0.40–1.50)
GFR: 54.86 mL/min — ABNORMAL LOW
Glucose, Bld: 145 mg/dL — ABNORMAL HIGH (ref 70–99)
Potassium: 5.2 meq/L — ABNORMAL HIGH (ref 3.5–5.1)
Sodium: 135 meq/L (ref 135–145)

## 2022-04-05 MED ORDER — METFORMIN HCL 500 MG PO TABS
500.0000 mg | ORAL_TABLET | Freq: Two times a day (BID) | ORAL | 1 refills | Status: DC
  Filled 2022-04-05 – 2022-04-26 (×2): qty 180, 90d supply, fill #0
  Filled 2022-07-11: qty 180, 90d supply, fill #1

## 2022-04-05 MED ORDER — IRBESARTAN 75 MG PO TABS
75.0000 mg | ORAL_TABLET | Freq: Every day | ORAL | 1 refills | Status: DC
  Filled 2022-04-05 – 2022-05-26 (×3): qty 90, 90d supply, fill #0
  Filled 2022-07-02 – 2022-08-22 (×2): qty 90, 90d supply, fill #1

## 2022-04-05 NOTE — Progress Notes (Signed)
OFFICE VISIT  04/05/2022  CC:  Chief Complaint  Patient presents with   Medical Management of Chronic Issues    Hypertension f/u; pt is fasting    Patient is a 72 y.o. male who presents accompanied by his wife for 1 week follow-up uncontrolled hypertension and diabetes. A/P as of last visit: "#1 uncontrolled hypertension in the setting of chronic renal insufficiency stage III. Will cautiously add a irbesartan 75 mg a day. He is getting bloodwork at his [place] tomorrow and I will check electrolytes and creatinine when I see him back in 1 week. Continue amlodipine 10 mg a day and Lopressor 100 mg twice daily.   #2 diabetes with nephropathy. Significant hyperglycemia lately, although hemoglobin A1c little over 2 months ago was 6.3 Has been on metformin in the past. Will restart metformin 500 mg twice a day.  If GFR gets consistently <45 then we'll have to change this."  INTERIM HX: Micheil is feeling well. Labs from 03/29/2022: Complete blood count normal.  Glucose 247.  Creatinine 1.41, GFR 53.  Potassium 5.1.  Liver panel normal except alkaline phosphatase 153.  Blood pressure 03/31/2022 at cardiology follow-up was 125/79, heart rate 65.  Weight 231 pounds.  No changes were made.  Home bp's 125 avg syst, 75 avg diast.  Avg HR 70. Glucoses 144-225  Ultrasound liver with Doppler at [facility] on 04/04/2022 showed blood flow in the normal direction in the major hepatic transplant blood vessels.  Also mild hepatic steatosis.   Past Medical History:  Diagnosis Date   AAA (abdominal aortic aneurysm) ([place]) 03/2014   repaired 2020   Ascending aortic aneurysm (HCC)    unrepaired. Stable at 4.3 cm on imaging by [facility] thoracic surg 10/2019 and 10/2020.   Bilateral renal cysts    simple (03/2014 MRI)   CAD (coronary artery disease)    Cholelithiases 2018   asymptomatic   Chronic diastolic heart failure (HCC)    Chronic renal insufficiency, stage 3 (moderate) ([place]) 2022   baseline sCr  1.3-1.5 (GFR 55)   COVID-19 virus infection 02/2020   sotrovimab infusion   Diabetes mellitus with complication ([place]) [id number]   A1c 6.8%. A1c 5.4% 11/2019   Diverticulosis    Gout    initially dx'd by diag arthrocentesis of elbow effusion in winter 2021, 3 episodes, all came after being started on chlorthalidone->I d/c'd chlorthal.   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  Improving + off oxygen as of 3 mo s/p liver transplant.  Doing well/resolved as of 02/2020 [facility] pulm f/u->they'll continue to follow.   History of blood transfusion 2018 X 4 dates   Chronic GI blood loss of unknown location despite full GI endoscopic eval, etc   History of liver transplant ([place]) 07/2019   [facility]   Hyperlipemia, mixed    elevated LFTs when on statins.     Hypertension    Cr bump 04/01/16 so I changed benicar-hct to benicar plain and added amlodipine 5 mg.   Increased prostate specific antigen (PSA) velocity 2021   0.11 to 4.77 from 2020 to 2021 (he got a liver transplant and was put on antirejection meds between these psa checks).   Iron deficiency anemia 05/2016   Acute blood loss anemia: hospitalized, required transfusion x 3 U: colonoscopy and capsule study unrevealing.  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  W/u for ? hemolytic anemia to be pursued by hematologist  as outpt.  Dr. [name], GI at [facility] following, too---he rec'd onc do bone marrow bx as of Jan 2019   Iron deficiency anemia due to chronic blood loss 2018/19   GI: transfusions x >20 required; multiple endoscopies and bleeding scans unrevealing. Lysteda and octreotide + monthly iron infusions as of 04/2017. Iron infusions changed to every other month as of 09/2018 hem f/u.   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  Changes noted on all CXRs in 2018/2019.  Liver Transplant eval 03/2018->mild restriction on PFTs but no obstruction.  See further details in PMH "pulm fibrosis" section   Microscopic hematuria    Eval unremarkable by Dr. Eulogio Ditch.   Nonmelanoma skin cancer 08/15/2018   2020 BCC nose, excised.  2022 R side of neck->mohs   Obesity    Pulmonary fibrosis (HCC)    PFTs: restrictive lung dz ([facility] Liver transplant eval 05/2018). [facility] pulm eval felt this was likely hepatopulmonary syndrome->causing his hypoxia->low likelihood of progression (as of 10/04/18 transplant clinic f/u). Pulm rehab helping as of 2020/2021. OFF oxygen as of 10/2019 transp f/u.   Spleen enlarged     Past Surgical History:  Procedure Laterality Date   ABDOMINAL AORTIC ANEURYSM REPAIR  08/23/2018   [facility]   CARDIAC CATHETERIZATION     CARDIOVASCULAR STRESS TEST  01/17/2012; 05/04/16   Normal stress nuclear study x 2 (2018--Normal perfusion. LVEF 66% with normal wall motion. This is a low risk study).   CERVICAL DISCECTOMY  1992   COLONOSCOPY N/A 05/27/2016   No site/explanation for blood loss found.  Erythematous mucosa in cecum and ascending colon--Cecal bx: normal.  Procedure: COLONOSCOPY;  Surgeon: Carol Ada, MD;  Location: [facility] ENDOSCOPY;  Service: Endoscopy;  Laterality: N/A;   COLONOSCOPY     01/07/22 diverticulosis but NO POLYPS.  No further recommended   COLONOSCOPY W/ POLYPECTOMY  09/10/2014   Polypectomy x 2: recall 5 yrs (Dr. [name]).   COLONOSCOPY WITH PROPOFOL N/A 01/07/2022   Procedure: COLONOSCOPY WITH PROPOFOL;  Surgeon: Carol Ada, MD;  Location: [facility] ENDOSCOPY;  Service: Gastroenterology;  Laterality: N/A;   CORONARY ANGIOPLASTY     CORONARY ARTERY BYPASS GRAFT  03/2006   CT ABDOMEN WITH CONTRAST ([place] HX)  11/2018   CT CHEST WWO CONTAST ([place] HX)  07/11/2019   No significant change  in interstitial lung disease with possible underlying hepatopulmonary syndrome, Increased ascites in the setting of cirrhosis, tiny new right basilar nodule   DEXA  03/07/2019   T score 0.2/NORMAL   ENTEROSCOPY N/A 07/31/2016   Procedure: ENTEROSCOPY;  Surgeon: Ladene Artist, MD;  Location: [facility] ENDOSCOPY;  Service: Endoscopy;  Laterality: N/A;   ENTEROSCOPY N/A 12/02/2016   Procedure: ENTEROSCOPY;  Surgeon: Carol Ada, MD;  Location: [facility] ENDOSCOPY;  Service: Endoscopy;  Laterality: N/A;   ESOPHAGOGASTRODUODENOSCOPY N/A 05/25/2016   No site/explanation for blood loss found.  Procedure: ESOPHAGOGASTRODUODENOSCOPY (EGD);  Surgeon: Juanita Craver, MD;  Location: [facility] ENDOSCOPY;  Service: Endoscopy;  Laterality: N/A;   ESOPHAGOGASTRODUODENOSCOPY N/A 09/23/2016   Procedure: ESOPHAGOGASTRODUODENOSCOPY (EGD);  Surgeon: Carol Ada, MD;  Location: [place];  Service: Endoscopy;  Laterality: N/A;   ESOPHAGOGASTRODUODENOSCOPY (EGD) WITH PROPOFOL N/A 08/21/2018   Procedure: ESOPHAGOGASTRODUODENOSCOPY (EGD) WITH PROPOFOL;  Surgeon: Carol Ada, MD;  Location: [facility] ENDOSCOPY;  Service: Endoscopy;  Laterality: N/A;   GIVENS CAPSULE STUDY N/A 05/27/2016   No source identified.  Repeat 10/2016--results pending.  Procedure: GIVENS CAPSULE STUDY;  Surgeon: Carol Ada, MD;  Location: [facility] ENDOSCOPY;  Service: Endoscopy;  Laterality: N/A;   GIVENS CAPSULE STUDY N/A 10/15/2016   Procedure: GIVENS CAPSULE STUDY;  Surgeon: Carol Ada, MD;  Location: [facility] ENDOSCOPY;  Service: Endoscopy;  Laterality: N/A;   GIVENS CAPSULE STUDY N/A 02/13/2017   Procedure: GIVENS CAPSULE STUDY;  Surgeon: Carol Ada, MD;  Location: [place];  Service: Endoscopy;  Laterality: N/A;   LIVER TRANSPLANT  08/02/2019   For NASH cirrhosis. [place]   NM GI BLOOD LOSS  01/27/2017   NEGATIVE   SMALL BOWEL ENTEROSCOPY  10/2016   ([facility], Dr. [name]: Double balloon enteroscopy--to the level of proximal ileum) jejunal  polyps- which were removed--not bleeding & benign.  No source of bleeding was identified.   TRANSTHORACIC ECHOCARDIOGRAM  05/25/2016; 10/2017   05/2016: EF 60%, normal wall motion, grd II DD, mild aortic stenosis, dilated aortic root and ascending aorta. 10/2017: Mild LVH; no mention made of LV function.  Moderate aorticstenosis with mean gradient 19 peak gradient 41. AVA 1.1 cm^2.  05/2018 EF 55%,4.3 cm ascend aneur, mild AS. 10/2020 EF 60-65%, normal diast fxn, mod AV sclerosis w/out sten/regurg.   VASECTOMY  1977    Outpatient Medications Prior to Visit  Medication Sig Dispense Refill   amLODipine (NORVASC) 10 MG tablet Take 1 tablet (10 mg total) by mouth daily. 90 tablet 3   ammonium lactate (AMLACTIN) 12 % cream Apply 2 times daily to forearms and hands in preparation for treatment with Efudex (fluorouracil) 385 g 99   aspirin EC 81 MG tablet Take 81 mg by mouth daily. Swallow whole.     azelastine (ASTELIN) 0.1 % nasal spray Place 2 sprays into both nostrils 2 (two) times daily 30 mL 12   furosemide (LASIX) 20 MG tablet Take 1 tablet (20 mg total) by mouth daily. 90 tablet 3   Magnesium 400 MG TABS Take 400 mg by mouth daily.     metoprolol tartrate (LOPRESSOR) 100 MG tablet Take 1 tablet (100 mg total) by mouth 2 (two) times daily. 180 tablet 3   mycophenolate (CELLCEPT) 250 MG capsule Take 2 capsules by mouth in the morning and 2 capsules in the evening 360 capsule 3   pantoprazole (PROTONIX) 40 MG tablet Take 1 tablet (40 mg total) by mouth once daily 90 tablet 3   rosuvastatin (CRESTOR) 10 MG tablet Take 1 tablet (10 mg total) by mouth daily. (Patient taking differently: Take 10 mg by mouth at bedtime.) 90 tablet 3   sodium zirconium cyclosilicate (LOKELMA) 10 g PACK packet Take 10 g (1 packet total) by mouth every Monday, Wednesday and Friday. Space 2 hours apart from Tacrolimus. 12 packet 6   tacrolimus (PROGRAF) 1 MG capsule Take 2 capsules (2 mg total) by mouth every morning AND 2  capsules (2 mg total) every evening. 360 capsule 3   triamcinolone cream (KENALOG) 0.1 % Apply 2 times a day to rash. Follow with CeraVe Cream.  Stop when smooth. (Patient taking differently: Apply 1 Application topically daily as needed (Rash).) 80 g 2   irbesartan (AVAPRO) 75 MG tablet Take 1 tablet (75 mg total) by mouth daily. 30 tablet 0   metFORMIN (GLUCOPHAGE) 500 MG tablet Take 1 tablet (500 mg total) by mouth 2 (two) times daily  with a meal. 60 tablet 0   Facility-Administered Medications Prior to Visit  Medication Dose Route Frequency Provider Last Rate Last Admin   heparin lock flush 100 unit/mL  500 Units Intracatheter Daily PRN Truitt Merle, MD       sodium chloride flush (NS) 0.9 % injection 10 mL  10 mL Intracatheter PRN Truitt Merle, MD        Allergies  Allergen Reactions   Niacinamide Itching    Improved upon discontinuation    Thiazide-Type Diuretics Other (See Comments)    Chlorthalidone caused gout   Quinolones Other (See Comments)    CONTRAINDICATED D/T PT HAVING AAA    Review of Systems As per HPI  PE:    04/05/2022    9:43 AM 03/31/2022    9:56 AM 03/28/2022    1:39 PM  Vitals with BMI  Height '5\' 11"'$  '5\' 11"'$    Weight 233 lbs 3 oz 231 lbs   BMI [id number]   Systolic [id number]  Diastolic 67 79 78  Pulse 62 65      Physical Exam  Gen: Alert, well appearing.  Patient is oriented to person, place, time, and situation. AFFECT: pleasant, lucid thought and speech. CV: RRR, 2-3/6 syst murmur, no diastolic murmur LUNGS: bibasilar soft insp crackles, good aeration, non-labored. EXT: no clubbing or cyanosis.  no edema.    LABS:  Last CBC Lab Results  Component Value Date   WBC 9.7 03/29/2022   HGB 14.7 03/29/2022   HCT 46 03/29/2022   MCV 84.3 02/25/2022   MCH 28.7 02/25/2022   RDW 16.3 (H) 02/25/2022   PLT 372 0000000   Last metabolic panel Lab Results  Component Value Date   GLUCOSE 149 (H) 02/25/2022   NA 137 03/29/2022   K 5.1 03/29/2022    CL 97 (A) 03/29/2022   CO2 23 (A) 03/29/2022   BUN 12 03/29/2022   CREATININE 1.4 (A) 03/29/2022   EGFR 53 03/29/2022   CALCIUM 10.0 03/29/2022   PHOS 2.9 07/29/2016   PROT 7.2 02/25/2022   ALBUMIN 4.6 03/29/2022   LABGLOB 2.4 10/29/2021   AGRATIO 2.0 10/29/2021   BILITOT 1.0 02/25/2022   ALKPHOS 153 (A) 03/29/2022   AST 13 (A) 03/29/2022   ALT 15 03/29/2022   ANIONGAP 7 02/25/2022   Last hemoglobin A1c Lab Results  Component Value Date   HGBA1C 6.3 (A) 01/13/2022   HGBA1C 6.3 01/13/2022   HGBA1C 6.3 01/13/2022   HGBA1C 6.3 01/13/2022   IMPRESSION AND PLAN:  #1 hypertension, now well-controlled on irbesartan 75 mg a day, amlodipine 10 mg a day, and Lopressor 100 mg twice a day. Basic metabolic panel today.  2.  Diabetes with nephropathy, poor control. Glucoses improving just a little bit since restarting metformin 500 mg twice daily a week ago. Next A1c due in about 2 weeks. Will give the 500 mg twice daily dosing a little bit longer and if not getting down near 100 fasting in the next couple weeks then will increase to 1000 mg twice a day.  #3 chronic renal insufficiency stage III. Monitor electrolytes and creatinine today, especially in the setting of recently restarting irbesartan and metformin.  An After Visit Summary was printed and given to the patient.  FOLLOW UP: Return in about 2 weeks (around 04/19/2022) for f/u DM and HTN.  Signed:  Crissie Sickles, MD           04/05/2022

## 2022-04-12 DIAGNOSIS — Z940 Kidney transplant status: Secondary | ICD-10-CM | POA: Diagnosis not present

## 2022-04-12 DIAGNOSIS — D849 Immunodeficiency, unspecified: Secondary | ICD-10-CM | POA: Diagnosis not present

## 2022-04-12 DIAGNOSIS — Z944 Liver transplant status: Secondary | ICD-10-CM | POA: Diagnosis not present

## 2022-04-12 LAB — CBC AND DIFFERENTIAL
HCT: 44 (ref 41–53)
Hemoglobin: 14.4 (ref 13.5–17.5)
Neutrophils Absolute: 7.2
Platelets: 388 10*3/uL (ref 150–400)
WBC: 10.1

## 2022-04-12 LAB — HEPATIC FUNCTION PANEL
ALT: 14 U/L (ref 10–40)
AST: 12 — AB (ref 14–40)
Alkaline Phosphatase: 141 — AB (ref 25–125)
Bilirubin, Total: 0.8

## 2022-04-12 LAB — COMPREHENSIVE METABOLIC PANEL
Albumin: 4.4 (ref 3.5–5.0)
Calcium: 9.6 (ref 8.7–10.7)
eGFR: 56

## 2022-04-12 LAB — BASIC METABOLIC PANEL
BUN: 13 (ref 4–21)
CO2: 24 — AB (ref 13–22)
Chloride: 98 — AB (ref 99–108)
Creatinine: 1.4 — AB (ref 0.6–1.3)
Glucose: 139
Potassium: 5.4 mEq/L — AB (ref 3.5–5.1)
Sodium: 136 — AB (ref 137–147)

## 2022-04-12 LAB — CBC: RBC: 5.19 — AB (ref 3.87–5.11)

## 2022-04-15 ENCOUNTER — Encounter: Payer: Self-pay | Admitting: Family Medicine

## 2022-04-15 NOTE — Telephone Encounter (Signed)
FYI. Please see below. Placed on provider desk for review

## 2022-04-16 ENCOUNTER — Other Ambulatory Visit (HOSPITAL_COMMUNITY): Payer: Self-pay

## 2022-04-18 ENCOUNTER — Other Ambulatory Visit (HOSPITAL_COMMUNITY): Payer: Self-pay

## 2022-04-18 ENCOUNTER — Other Ambulatory Visit: Payer: Self-pay

## 2022-04-19 DIAGNOSIS — I77810 Thoracic aortic ectasia: Secondary | ICD-10-CM | POA: Diagnosis not present

## 2022-04-20 ENCOUNTER — Encounter: Payer: Self-pay | Admitting: Family Medicine

## 2022-04-20 ENCOUNTER — Ambulatory Visit (INDEPENDENT_AMBULATORY_CARE_PROVIDER_SITE_OTHER): Payer: Medicare Other | Admitting: Family Medicine

## 2022-04-20 VITALS — BP 132/76 | HR 71 | Temp 98.4°F | Ht 71.0 in | Wt 232.0 lb

## 2022-04-20 DIAGNOSIS — I10 Essential (primary) hypertension: Secondary | ICD-10-CM

## 2022-04-20 DIAGNOSIS — E1121 Type 2 diabetes mellitus with diabetic nephropathy: Secondary | ICD-10-CM

## 2022-04-20 DIAGNOSIS — N1832 Chronic kidney disease, stage 3b: Secondary | ICD-10-CM | POA: Diagnosis not present

## 2022-04-20 DIAGNOSIS — E875 Hyperkalemia: Secondary | ICD-10-CM | POA: Diagnosis not present

## 2022-04-20 LAB — POCT GLYCOSYLATED HEMOGLOBIN (HGB A1C)
HbA1c POC (<> result, manual entry): 7.8 % (ref 4.0–5.6)
HbA1c, POC (controlled diabetic range): 7.8 % — AB (ref 0.0–7.0)
HbA1c, POC (prediabetic range): 7.8 % — AB (ref 5.7–6.4)
Hemoglobin A1C: 7.8 % — AB (ref 4.0–5.6)

## 2022-04-20 NOTE — Progress Notes (Signed)
OFFICE VISIT  04/20/2022  CC:  Chief Complaint  Patient presents with   Medical Management of Chronic Issues    Patient is a 72 y.o. male who presents accompanied by his wife for 2-week follow-up diabetes and pretension. A/P as of last visit: "#1 hypertension, now well-controlled on irbesartan 75 mg a day, amlodipine 10 mg a day, and Lopressor 100 mg twice a day. Basic metabolic panel today.   2.  Diabetes with nephropathy, poor control. Glucoses improving just a little bit since restarting metformin 500 mg twice daily a week ago. Next A1c due in about 2 weeks. Will give the 500 mg twice daily dosing a little bit longer and if not getting down near 100 fasting in the next couple weeks then will increase to 1000 mg twice a day.   #3 chronic renal insufficiency stage III. Monitor electrolytes and creatinine today, especially in the setting of recently restarting irbesartan and metformin."  INTERIM HX: Baily is feeling well. Last 2 wks bp's reviewed:  120s/70s consistently, HR 70s. CBGs: 134-204 (135-156 the last 1 wk).  ROS as above, plus--> no fevers, no CP, no SOB, no wheezing, no cough, no dizziness, no Has.  No focal weakness, paresthesias, or tremors.   No recent changes in lower legs.  No palpitations.     Past Medical History:  Diagnosis Date   AAA (abdominal aortic aneurysm) ([place]) 03/2014   repaired 2020   Ascending aortic aneurysm (HCC)    unrepaired. Stable at 4.3 cm on imaging by [facility] thoracic surg 10/2019 and 10/2020.   Bilateral renal cysts    simple (03/2014 MRI)   CAD (coronary artery disease)    Cholelithiases 2018   asymptomatic   Chronic diastolic heart failure (HCC)    Chronic renal insufficiency, stage 3 (moderate) ([place]) 2022   baseline sCr 1.3-1.5 (GFR 55)   COVID-19 virus infection 02/2020   sotrovimab infusion   Diabetes mellitus with complication ([place]) [id number]   A1c 6.8%. A1c 5.4% 11/2019   Diverticulosis    Gout    initially dx'd by diag  arthrocentesis of elbow effusion in winter 2021, 3 episodes, all came after being started on chlorthalidone->I d/c'd chlorthal.   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  Improving + off oxygen as of 3 mo s/p liver transplant.  Doing well/resolved as of 02/2020 [facility] pulm f/u->they'll continue to follow.   History of blood transfusion 2018 X 4 dates   Chronic GI blood loss of unknown location despite full GI endoscopic eval, etc   History of liver transplant ([place]) 07/2019   [facility]   Hyperlipemia, mixed    elevated LFTs when on statins.     Hypertension    Cr bump 04/01/16 so I changed benicar-hct to benicar plain and added amlodipine 5 mg.   Increased prostate specific antigen (PSA) velocity 2021   0.11 to 4.77 from 2020 to 2021 (he got a liver transplant and was put on antirejection meds between these psa checks).   Iron deficiency anemia 05/2016   Acute blood loss anemia: hospitalized, required transfusion x 3 U: colonoscopy and capsule study unrevealing.  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  W/u for ? hemolytic anemia to be pursued by hematologist as outpt.  Dr. [name], GI at [facility] following, too---he rec'd onc do bone marrow bx as of Jan 2019   Iron deficiency anemia due to chronic blood loss 2018/19  GI: transfusions x >20 required; multiple endoscopies and bleeding scans unrevealing. Lysteda and octreotide + monthly iron infusions as of 04/2017. Iron infusions changed to every other month as of 09/2018 hem f/u.   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  Changes noted on all CXRs in 2018/2019.  Liver Transplant eval 03/2018->mild restriction  on PFTs but no obstruction.  See further details in [facility] "pulm fibrosis" section   Microscopic hematuria    Eval unremarkable by Dr. Eulogio Ditch.   Nonmelanoma skin cancer 08/15/2018   2020 BCC nose, excised.  2022 R side of neck->mohs   Obesity    Pulmonary fibrosis (HCC)    PFTs: restrictive lung dz ([facility] transplant eval 05/2018). [facility] pulm eval felt this was likely hepatopulmonary syndrome->causing his hypoxia->low likelihood of progression (as of 10/04/18 transplant clinic f/u). Pulm rehab helping as of 2020/2021. OFF oxygen as of 10/2019 transp f/u.   Spleen enlarged     Past Surgical History:  Procedure Laterality Date   ABDOMINAL AORTIC ANEURYSM REPAIR  08/23/2018   [facility]   CARDIAC CATHETERIZATION     CARDIOVASCULAR STRESS TEST  01/17/2012; 05/04/16   Normal stress nuclear study x 2 (2018--Normal perfusion. LVEF 66% with normal wall motion. This is a low risk study).   CERVICAL DISCECTOMY  1992   COLONOSCOPY N/A 05/27/2016   No site/explanation for blood loss found.  Erythematous mucosa in cecum and ascending colon--Cecal bx: normal.  Procedure: COLONOSCOPY;  Surgeon: Carol Ada, MD;  Location: [facility] ENDOSCOPY;  Service: Endoscopy;  Laterality: N/A;   COLONOSCOPY     01/07/22 diverticulosis but NO POLYPS.  No further recommended   COLONOSCOPY W/ POLYPECTOMY  09/10/2014   Polypectomy x 2: recall 5 yrs (Dr. [name]).   COLONOSCOPY WITH PROPOFOL N/A 01/07/2022   Procedure: COLONOSCOPY WITH PROPOFOL;  Surgeon: Carol Ada, MD;  Location: [facility] ENDOSCOPY;  Service: Gastroenterology;  Laterality: N/A;   CORONARY ANGIOPLASTY     CORONARY ARTERY BYPASS GRAFT  03/2006   CT ABDOMEN WITH CONTRAST ([place] [facility])  11/2018   CT CHEST WWO CONTAST ([place] [facility])  07/11/2019   No significant change in interstitial lung disease with possible underlying hepatopulmonary syndrome, Increased ascites in the setting of cirrhosis, tiny new right basilar nodule   DEXA  03/07/2019   T score 0.2/NORMAL   ENTEROSCOPY N/A  07/31/2016   Procedure: ENTEROSCOPY;  Surgeon: Ladene Artist, MD;  Location: [facility] ENDOSCOPY;  Service: Endoscopy;  Laterality: N/A;   ENTEROSCOPY N/A 12/02/2016   Procedure: ENTEROSCOPY;  Surgeon: Carol Ada, MD;  Location: [facility] ENDOSCOPY;  Service: Endoscopy;  Laterality: N/A;   ESOPHAGOGASTRODUODENOSCOPY N/A 05/25/2016   No site/explanation for blood loss found.  Procedure: ESOPHAGOGASTRODUODENOSCOPY (EGD);  Surgeon: Juanita Craver, MD;  Location[facility] ENDOSCOPY;  Service: Endoscopy;  Laterality: N/A;   ESOPHAGOGASTRODUODENOSCOPY N/A 09/23/2016   Procedure: ESOPHAGOGASTRODUODENOSCOPY (EGD);  Surgeon: Carol Ada, MD;  Location: [place];  Service: Endoscopy;  Laterality: N/A;   ESOPHAGOGASTRODUODENOSCOPY (EGD) WITH PROPOFOL N/A 08/21/2018   Procedure: ESOPHAGOGASTRODUODENOSCOPY (EGD) WITH PROPOFOL;  Surgeon: Carol Ada, MD;  Location: [facility] ENDOSCOPY;  Service: Endoscopy;  Laterality: N/A;   GIVENS CAPSULE STUDY N/A 05/27/2016   No source identified.  Repeat 10/2016--results pending.  Procedure: GIVENS CAPSULE STUDY;  Surgeon: Saralyn Pilar  [name], MD;  Location: [place];  Service: Endoscopy;  Laterality: N/A;   GIVENS CAPSULE STUDY N/A 10/15/2016   Procedure: GIVENS CAPSULE STUDY;  Surgeon: Carol Ada, MD;  Location: [facility] ENDOSCOPY;  Service: Endoscopy;  Laterality: N/A;   GIVENS CAPSULE STUDY N/A 02/13/2017   Procedure: GIVENS CAPSULE STUDY;  Surgeon: Carol Ada, MD;  Location: [place];  Service: Endoscopy;  Laterality: N/A;   LIVER TRANSPLANT  08/02/2019   For NASH cirrhosis. [place]   NM GI BLOOD LOSS  01/27/2017   NEGATIVE   SMALL BOWEL ENTEROSCOPY  10/2016   ([facility], Dr. [name]: Double balloon enteroscopy--to the level of proximal ileum) jejunal polyps- which were removed--not bleeding & benign.  No source of bleeding was identified.   TRANSTHORACIC ECHOCARDIOGRAM  05/25/2016; 10/2017   05/2016: EF 60%, normal wall motion, grd II DD, mild aortic stenosis, dilated  aortic root and ascending aorta. 10/2017: Mild LVH; no mention made of LV function.  Moderate aorticstenosis with mean gradient 19 peak gradient 41. AVA 1.1 cm^2.  05/2018 EF 55%,4.3 cm ascend aneur, mild AS. 10/2020 EF 60-65%, normal diast fxn, mod AV sclerosis w/out sten/regurg.   VASECTOMY  1977    Outpatient Medications Prior to Visit  Medication Sig Dispense Refill   amLODipine (NORVASC) 10 MG tablet Take 1 tablet (10 mg total) by mouth daily. 90 tablet 3   ammonium lactate (AMLACTIN) 12 % cream Apply 2 times daily to forearms and hands in preparation for treatment with Efudex (fluorouracil) 385 g 99   aspirin EC 81 MG tablet Take 81 mg by mouth daily. Swallow whole.     azelastine (ASTELIN) 0.1 % nasal spray Place 2 sprays into both nostrils 2 (two) times daily 30 mL 12   furosemide (LASIX) 20 MG tablet Take 1 tablet (20 mg total) by mouth daily. 90 tablet 3   irbesartan (AVAPRO) 75 MG tablet Take 1 tablet (75 mg total) by mouth daily. 90 tablet 1   Magnesium 400 MG TABS Take 400 mg by mouth daily.     metFORMIN (GLUCOPHAGE) 500 MG tablet Take 1 tablet (500 mg total) by mouth 2 (two) times daily with a meal. 180 tablet 1   metoprolol tartrate (LOPRESSOR) 100 MG tablet Take 1 tablet (100 mg total) by mouth 2 (two) times daily. 180 tablet 3   mycophenolate (CELLCEPT) 250 MG capsule Take 2 capsules by mouth in the morning and 2 capsules in the evening 360 capsule 3   pantoprazole (PROTONIX) 40 MG tablet Take 1 tablet (40 mg total) by mouth once daily 90 tablet 3   rosuvastatin (CRESTOR) 10 MG tablet Take 1 tablet (10 mg total) by mouth daily. (Patient taking differently: Take 10 mg by mouth at bedtime.) 90 tablet 3   sodium zirconium cyclosilicate (LOKELMA) 10 g PACK packet Take 10 g (1 packet total) by mouth every Monday, Wednesday and Friday. Space 2 hours apart from Tacrolimus. 12 packet 6   tacrolimus (PROGRAF) 1 MG capsule Take 2 capsules (2 mg total) by mouth every morning AND 2 capsules (2  mg total) every evening. 360 capsule 3   triamcinolone cream (KENALOG) 0.1 % Apply 2 times a day to rash. Follow with CeraVe Cream.  Stop when smooth. (Patient taking differently: Apply 1 Application topically daily as needed (Rash).) 80 g 2   Facility-Administered Medications Prior to Visit  Medication Dose Route Frequency Provider Last Rate Last Admin   heparin lock flush 100 unit/mL  500 Units  Intracatheter Daily PRN Truitt Merle, MD       sodium chloride flush (NS) 0.9 % injection 10 mL  10 mL Intracatheter PRN Truitt Merle, MD        Allergies  Allergen Reactions   Niacinamide Itching    Improved upon discontinuation    Thiazide-Type Diuretics Other (See Comments)    Chlorthalidone caused gout   Quinolones Other (See Comments)    CONTRAINDICATED D/T PT HAVING AAA    Review of Systems As per HPI  PE:    04/20/2022    2:23 PM 04/05/2022    9:43 AM 03/31/2022    9:56 AM  Vitals with BMI  Height '5\' 11"'$  '5\' 11"'$  '5\' 11"'$   Weight 232 lbs 233 lbs 3 oz 231 lbs  BMI 32.37 [id number]  Systolic [id number]  Diastolic 76 67 79  Pulse 71 62 65     Physical Exam  Gen: Alert, well appearing.  Patient is oriented to person, place, time, and situation. AFFECT: pleasant, lucid thought and speech. No further exam today  LABS:  Last CBC Lab Results  Component Value Date   WBC 10.1 04/12/2022   HGB 14.4 04/12/2022   HCT 44 04/12/2022   MCV 84.3 02/25/2022   MCH 28.7 02/25/2022   RDW 16.3 (H) 02/25/2022   PLT 388 [id number]   Last metabolic panel Lab Results  Component Value Date   GLUCOSE 145 (H) 04/05/2022   NA 136 (A) 04/12/2022   K 5.4 (A) 04/12/2022   CL 98 (A) 04/12/2022   CO2 24 (A) 04/12/2022   BUN 13 04/12/2022   CREATININE 1.4 (A) 04/12/2022   EGFR 56 04/12/2022   CALCIUM 9.6 04/12/2022   PHOS 2.9 07/29/2016   PROT 7.2 02/25/2022   ALBUMIN 4.4 04/12/2022   LABGLOB 2.4 10/29/2021   AGRATIO 2.0 10/29/2021   BILITOT 1.0 02/25/2022   ALKPHOS 141 (A) 04/12/2022    AST 12 (A) 04/12/2022   ALT 14 04/12/2022   ANIONGAP 7 02/25/2022   Last hemoglobin A1c Lab Results  Component Value Date   HGBA1C 7.8 (A) 04/20/2022   HGBA1C 7.8 04/20/2022   HGBA1C 7.8 (A) 04/20/2022   HGBA1C 7.8 (A) 04/20/2022   IMPRESSION AND PLAN:  #1 hypertension, well-controlled on amlodipine 10 mg a day, irbesartan 75 mg a day, and Lopressor 100 mg twice a day. Electrolytes and creatinine today.  2.  Diabetes with nephropathy. Control has worsened over the last several months. POC Hba1c today is 7.8%. He is back on metformin 500 bid for 3 wks now. Will continue this dosing since glucoses have started to decline nicely. Monitoring GFR closely, will have to get off metformin if this drops below 45.  3.  Chronic renal insufficiency stage III with hyperkalemia. Monitoring electrolytes and creatinine closely, especially given relatively recent addition of a irbesartan to control blood pressure. Electrolytes and creatinine checked today.  4.  Ascending aortic dilation: Cardiac MRI chest with and without contrast yesterday with [facility] showed this to be stable at 4 cm, no change compared to 2022.  An After Visit Summary was printed and given to the patient.  FOLLOW UP: Return for Cancel 05/16/22 appt and reschedule f/u [facility] for 3 months from now..  Signed:  Crissie Sickles, MD           04/20/2022

## 2022-04-21 LAB — BASIC METABOLIC PANEL
BUN: 13 mg/dL (ref 6–23)
CO2: 29 mEq/L (ref 19–32)
Calcium: 9.6 mg/dL (ref 8.4–10.5)
Chloride: 96 mEq/L (ref 96–112)
Creatinine, Ser: 1.38 mg/dL (ref 0.40–1.50)
GFR: 51.05 mL/min — ABNORMAL LOW (ref >60.00)
Glucose, Bld: 134 mg/dL — ABNORMAL HIGH (ref 70–99)
Potassium: 4.6 mEq/L (ref 3.5–5.1)
Sodium: 134 mEq/L — ABNORMAL LOW (ref 135–145)

## 2022-04-26 ENCOUNTER — Other Ambulatory Visit (HOSPITAL_COMMUNITY): Payer: Self-pay

## 2022-04-27 ENCOUNTER — Other Ambulatory Visit (HOSPITAL_COMMUNITY): Payer: Self-pay

## 2022-04-27 DIAGNOSIS — I77810 Thoracic aortic ectasia: Secondary | ICD-10-CM | POA: Diagnosis not present

## 2022-05-11 ENCOUNTER — Other Ambulatory Visit (HOSPITAL_COMMUNITY): Payer: Self-pay

## 2022-05-12 ENCOUNTER — Other Ambulatory Visit (HOSPITAL_COMMUNITY): Payer: Self-pay

## 2022-05-12 ENCOUNTER — Other Ambulatory Visit: Payer: Self-pay

## 2022-05-13 ENCOUNTER — Other Ambulatory Visit (HOSPITAL_COMMUNITY): Payer: Self-pay

## 2022-05-16 ENCOUNTER — Ambulatory Visit: Payer: Medicare Other | Admitting: Family Medicine

## 2022-05-17 DIAGNOSIS — Z940 Kidney transplant status: Secondary | ICD-10-CM | POA: Diagnosis not present

## 2022-05-17 DIAGNOSIS — Z944 Liver transplant status: Secondary | ICD-10-CM | POA: Diagnosis not present

## 2022-05-17 DIAGNOSIS — D849 Immunodeficiency, unspecified: Secondary | ICD-10-CM | POA: Diagnosis not present

## 2022-05-25 ENCOUNTER — Other Ambulatory Visit (HOSPITAL_BASED_OUTPATIENT_CLINIC_OR_DEPARTMENT_OTHER): Payer: Self-pay

## 2022-05-26 ENCOUNTER — Other Ambulatory Visit (HOSPITAL_COMMUNITY): Payer: Self-pay

## 2022-06-01 ENCOUNTER — Other Ambulatory Visit (HOSPITAL_COMMUNITY): Payer: Self-pay

## 2022-06-03 ENCOUNTER — Other Ambulatory Visit (HOSPITAL_COMMUNITY): Payer: Self-pay

## 2022-06-10 ENCOUNTER — Other Ambulatory Visit: Payer: Self-pay

## 2022-06-10 ENCOUNTER — Other Ambulatory Visit (HOSPITAL_COMMUNITY): Payer: Self-pay

## 2022-06-15 ENCOUNTER — Other Ambulatory Visit: Payer: Self-pay

## 2022-06-16 ENCOUNTER — Other Ambulatory Visit: Payer: Self-pay

## 2022-06-30 DIAGNOSIS — C4442 Squamous cell carcinoma of skin of scalp and neck: Secondary | ICD-10-CM | POA: Diagnosis not present

## 2022-07-02 ENCOUNTER — Other Ambulatory Visit (HOSPITAL_COMMUNITY): Payer: Self-pay

## 2022-07-11 ENCOUNTER — Other Ambulatory Visit (HOSPITAL_COMMUNITY): Payer: Self-pay

## 2022-07-11 ENCOUNTER — Other Ambulatory Visit: Payer: Self-pay

## 2022-07-12 DIAGNOSIS — I7143 Infrarenal abdominal aortic aneurysm, without rupture: Secondary | ICD-10-CM | POA: Diagnosis not present

## 2022-07-12 DIAGNOSIS — Z95828 Presence of other vascular implants and grafts: Secondary | ICD-10-CM | POA: Diagnosis not present

## 2022-07-13 ENCOUNTER — Other Ambulatory Visit: Payer: Self-pay

## 2022-07-13 ENCOUNTER — Other Ambulatory Visit (HOSPITAL_COMMUNITY): Payer: Self-pay

## 2022-07-13 MED ORDER — SODIUM ZIRCONIUM CYCLOSILICATE 10 G PO PACK
1.0000 | PACK | ORAL | 6 refills | Status: DC
  Filled 2022-07-13: qty 12, 28d supply, fill #0
  Filled 2022-08-10: qty 12, 28d supply, fill #1
  Filled 2022-09-05: qty 12, 28d supply, fill #2
  Filled 2022-10-02: qty 12, 28d supply, fill #3

## 2022-07-19 NOTE — Patient Instructions (Signed)

## 2022-07-21 ENCOUNTER — Encounter: Payer: Self-pay | Admitting: Family Medicine

## 2022-07-21 ENCOUNTER — Ambulatory Visit (INDEPENDENT_AMBULATORY_CARE_PROVIDER_SITE_OTHER): Payer: Medicare Other | Admitting: Family Medicine

## 2022-07-21 VITALS — BP 118/75 | HR 63 | Wt 235.6 lb

## 2022-07-21 DIAGNOSIS — E1121 Type 2 diabetes mellitus with diabetic nephropathy: Secondary | ICD-10-CM | POA: Diagnosis not present

## 2022-07-21 DIAGNOSIS — Z7984 Long term (current) use of oral hypoglycemic drugs: Secondary | ICD-10-CM | POA: Diagnosis not present

## 2022-07-21 DIAGNOSIS — K439 Ventral hernia without obstruction or gangrene: Secondary | ICD-10-CM

## 2022-07-21 DIAGNOSIS — I10 Essential (primary) hypertension: Secondary | ICD-10-CM | POA: Diagnosis not present

## 2022-07-21 DIAGNOSIS — E7800 Pure hypercholesterolemia, unspecified: Secondary | ICD-10-CM | POA: Diagnosis not present

## 2022-07-21 DIAGNOSIS — N1831 Chronic kidney disease, stage 3a: Secondary | ICD-10-CM

## 2022-07-21 LAB — POCT GLYCOSYLATED HEMOGLOBIN (HGB A1C)
HbA1c POC (<> result, manual entry): 5.5 % (ref 4.0–5.6)
HbA1c, POC (controlled diabetic range): 5.5 % (ref 0.0–7.0)
HbA1c, POC (prediabetic range): 5.5 % — AB (ref 5.7–6.4)
Hemoglobin A1C: 5.5 % (ref 4.0–5.6)

## 2022-07-21 LAB — COMPREHENSIVE METABOLIC PANEL
ALT: 9 U/L (ref 0–53)
AST: 11 U/L (ref 0–37)
Albumin: 4.6 g/dL (ref 3.5–5.2)
Alkaline Phosphatase: 99 U/L (ref 39–117)
BUN: 14 mg/dL (ref 6–23)
CO2: 29 mEq/L (ref 19–32)
Calcium: 9.6 mg/dL (ref 8.4–10.5)
Chloride: 98 mEq/L (ref 96–112)
Creatinine, Ser: 1.53 mg/dL — ABNORMAL HIGH (ref 0.40–1.50)
GFR: 45.03 mL/min — ABNORMAL LOW (ref >60.00)
Glucose, Bld: 123 mg/dL — ABNORMAL HIGH (ref 70–99)
Potassium: 4.9 mEq/L (ref 3.5–5.1)
Sodium: 135 mEq/L (ref 135–145)
Total Bilirubin: 1.1 mg/dL (ref 0.2–1.2)
Total Protein: 7.2 g/dL (ref 6.0–8.3)

## 2022-07-21 LAB — LIPID PANEL
Cholesterol: 94 mg/dL (ref 0–200)
HDL: 25.9 mg/dL — ABNORMAL LOW (ref >39.00)
LDL Cholesterol: 41 mg/dL (ref 0–99)
NonHDL: 67.71
Total CHOL/HDL Ratio: 4
Triglycerides: 136 mg/dL (ref 0.0–149.0)
VLDL: 27.2 mg/dL (ref 0.0–40.0)

## 2022-07-21 NOTE — Progress Notes (Signed)
OFFICE VISIT  07/21/2022  CC:  Chief Complaint  Patient presents with   Diabetes    Patient is a 72 y.o. male who presents for 59-month follow-up diabetes, hypertension, and chronic renal insufficiency stage III. A/P as of last visit: "#1 hypertension, well-controlled on amlodipine 10 mg a day, irbesartan 75 mg a day, and Lopressor 100 mg twice a day. Electrolytes and creatinine today.   2.  Diabetes with nephropathy. Control has worsened over the last several months. POC Hba1c today is 7.8%. He is back on metformin 500 bid for 3 wks now. Will continue this dosing since glucoses have started to decline nicely. Monitoring GFR closely, will have to get off metformin if this drops below 45.   3.  Chronic renal insufficiency stage III with hyperkalemia. Monitoring electrolytes and creatinine closely, especially given relatively recent addition of a irbesartan to control blood pressure. Electrolytes and creatinine checked today.   4.  Ascending aortic dilation: Cardiac MRI chest with and without contrast yesterday with [facility] showed this to be stable at 4 cm, no change compared to 2022."  INTERIM HX: All labs were stable last visit.  William [name] feels well. Has been taking care of his wife more lately because she broke her leg. He has not been as active with his daily exercise as he used to be. He has gained a little weight lately.  Home blood pressures well within normal. Average fasting glucose around 130.  He does not check his glucose later in the day at all.   Past Medical History:  Diagnosis Date   AAA (abdominal aortic aneurysm) (HCC) 03/2014   repaired 2020   Ascending aortic aneurysm (HCC)    unrepaired. Stable at 4.3 cm on imaging by [facility] thoracic surg 10/2019 and 10/2020.   Bilateral renal cysts    simple (03/2014 MRI)   CAD (coronary artery disease)    Cholelithiases 2018   asymptomatic   Chronic diastolic heart failure (HCC)    Chronic renal insufficiency, stage 3  (moderate) (HCC) 2022   baseline sCr 1.3-1.5 (GFR 55)   COVID-19 virus infection 02/2020   sotrovimab infusion   Diabetes mellitus with complication (HCC) 07/2014   A1c 6.8%. A1c 5.4% 11/2019   Diverticulosis    Gout    initially dx'd by diag arthrocentesis of elbow effusion in winter 2021, 3 episodes, all came after being started on chlorthalidone->I d/c'd chlorthal.   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  Improving + off oxygen as of 3 mo s/p liver transplant.  Doing well/resolved as of 02/2020 [facility] pulm f/u->they'll continue to follow.   History of blood transfusion 2018 X 4 dates   Chronic GI blood loss of unknown location despite full GI endoscopic eval, etc   History of liver transplant (HCC) 07/2019   [facility]   Hyperlipemia, mixed    elevated LFTs when on statins.     Hypertension    Cr bump 04/01/16 so I changed benicar-hct to benicar plain and added amlodipine 5 mg.   Increased prostate specific antigen (PSA) velocity 2021   0.11 to 4.77 from 2020 to 2021 (he got a liver transplant and was put on antirejection meds between these psa checks).   Iron deficiency anemia 05/2016   Acute blood loss anemia: hospitalized, required transfusion x 3 U: colonoscopy and capsule study unrevealing.  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  W/u for ? hemolytic anemia  to be pursued by hematologist as outpt.  Dr. Theodore Demark, GI at [facility] following, too---he rec'd onc do bone marrow bx as of Jan 2019   Iron deficiency anemia due to chronic blood loss 2018/19   GI: transfusions x >20 required; multiple endoscopies and bleeding scans unrevealing. Lysteda and octreotide + monthly iron infusions as of 04/2017. Iron infusions changed to every other month as of 09/2018 hem f/u.   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  Changes noted on all CXRs in 2018/2019.  Liver Transplant eval 03/2018->mild restriction on PFTs but no obstruction.  See further details in PMH "pulm fibrosis" section   Microscopic hematuria    Eval unremarkable by Dr. Hillis Range.   Nonmelanoma skin cancer 08/15/2018   2020 BCC nose, excised.  2022 R side of neck->mohs   Obesity    Pulmonary fibrosis (HCC)    PFTs: restrictive lung dz ([facility] Liver transplant eval 05/2018). [facility] pulm eval felt this was likely hepatopulmonary syndrome->causing his hypoxia->low likelihood of progression (as of 10/04/18 transplant clinic f/u). Pulm rehab helping as of 2020/2021. OFF oxygen as of 10/2019 transp f/u.   Spleen enlarged     Past Surgical History:  Procedure Laterality Date   ABDOMINAL AORTIC ANEURYSM REPAIR  08/23/2018   [facility]   CARDIAC CATHETERIZATION     CARDIOVASCULAR STRESS TEST  01/17/2012; 05/04/16   Normal stress nuclear study x 2 (2018--Normal perfusion. LVEF 66% with normal wall motion. This is a low risk study).   CERVICAL DISCECTOMY  1992   COLONOSCOPY N/A 05/27/2016   No site/explanation for blood loss found.  Erythematous mucosa in cecum and ascending colon--Cecal bx: normal.  Procedure: COLONOSCOPY;  Surgeon: Jeani Hawking, MD;  Location: [facility] ENDOSCOPY;  Service: Endoscopy;  Laterality: N/A;   COLONOSCOPY     01/07/22 diverticulosis but NO POLYPS.  No further recommended   COLONOSCOPY W/ POLYPECTOMY  09/10/2014   Polypectomy x 2: recall 5 yrs (Dr. Loreta Ave).   COLONOSCOPY WITH PROPOFOL N/A 01/07/2022   Procedure: COLONOSCOPY WITH PROPOFOL;  Surgeon: Jeani Hawking, MD;  Location: [facility] ENDOSCOPY;  Service: Gastroenterology;  Laterality: N/A;   CORONARY ANGIOPLASTY     CORONARY ARTERY BYPASS GRAFT  03/2006   CT ABDOMEN WITH CONTRAST ([facility] HX)  11/2018   CT CHEST WWO CONTAST ([facility] HX)   07/11/2019   No significant change in interstitial lung disease with possible underlying hepatopulmonary syndrome, Increased ascites in the setting of cirrhosis, tiny new right basilar nodule   DEXA  03/07/2019   T score 0.2/NORMAL   ENTEROSCOPY N/A 07/31/2016   Procedure: ENTEROSCOPY;  Surgeon: Meryl Dare, MD;  Location: [facility] ENDOSCOPY;  Service: Endoscopy;  Laterality: N/A;   ENTEROSCOPY N/A 12/02/2016   Procedure: ENTEROSCOPY;  Surgeon: Jeani Hawking, MD;  Location: [facility] ENDOSCOPY;  Service: Endoscopy;  Laterality: N/A;   ESOPHAGOGASTRODUODENOSCOPY N/A 05/25/2016   No site/explanation for blood loss found.  Procedure: ESOPHAGOGASTRODUODENOSCOPY (EGD);  Surgeon: Charna Elizabeth, MD;  Location: [facility] ENDOSCOPY;  Service: Endoscopy;  Laterality: N/A;   ESOPHAGOGASTRODUODENOSCOPY N/A 09/23/2016   Procedure: ESOPHAGOGASTRODUODENOSCOPY (EGD);  Surgeon: Jeani Hawking, MD;  Location: [facility] ENDOSCOPY;  Service: Endoscopy;  Laterality: N/A;   ESOPHAGOGASTRODUODENOSCOPY (EGD) WITH PROPOFOL N/A 08/21/2018   Procedure: ESOPHAGOGASTRODUODENOSCOPY (EGD) WITH PROPOFOL;  Surgeon: Jeani Hawking, MD;  Location: [facility] ENDOSCOPY;  Service: Endoscopy;  Laterality: N/A;   GIVENS CAPSULE STUDY N/A 05/27/2016   No source identified.  Repeat 10/2016--results pending.  Procedure: GIVENS CAPSULE STUDY;  Surgeon: Jeani Hawking, MD;  Location: [facility] ENDOSCOPY;  Service: Endoscopy;  Laterality: N/A;   GIVENS CAPSULE STUDY N/A 10/15/2016   Procedure: GIVENS CAPSULE STUDY;  Surgeon: Jeani Hawking, MD;  Location: [facility] ENDOSCOPY;  Service: Endoscopy;  Laterality: N/A;   GIVENS CAPSULE STUDY N/A 02/13/2017   Procedure: GIVENS CAPSULE STUDY;  Surgeon: Jeani Hawking, MD;  Location: [facility] ENDOSCOPY;  Service: Endoscopy;  Laterality: N/A;   LIVER TRANSPLANT  08/02/2019   For NASH cirrhosis. [facility]   NECK SURGERY  1991   NM GI BLOOD LOSS  01/27/2017   NEGATIVE   SMALL BOWEL ENTEROSCOPY  10/2016   ([facility], Dr. Theodore Demark: Double balloon enteroscopy--to the  level of proximal ileum) jejunal polyps- which were removed--not bleeding & benign.  No source of bleeding was identified.   TRANSTHORACIC ECHOCARDIOGRAM  05/25/2016; 10/2017   05/2016: EF 60%, normal wall motion, grd II DD, mild aortic stenosis, dilated aortic root and ascending aorta. 10/2017: Mild LVH; no mention made of LV function.  Moderate aorticstenosis with mean gradient 19 peak gradient 41. AVA 1.1 cm^2.  05/2018 EF 55%,4.3 cm ascend aneur, mild AS. 10/2020 EF 60-65%, normal diast fxn, mod AV sclerosis w/out sten/regurg.   VASECTOMY  1977    Outpatient Medications Prior to Visit  Medication Sig Dispense Refill   amLODipine (NORVASC) 10 MG tablet Take 1 tablet (10 mg total) by mouth daily. 90 tablet 3   ammonium lactate (AMLACTIN) 12 % cream Apply 2 times daily to forearms and hands in preparation for treatment with Efudex (fluorouracil) 385 g 99   aspirin EC 81 MG tablet Take 81 mg by mouth daily. Swallow whole.     azelastine (ASTELIN) 0.1 % nasal spray Place 2 sprays into both nostrils 2 (two) times daily 30 mL 12   furosemide (LASIX) 20 MG tablet Take 1 tablet (20 mg total) by mouth daily. 90 tablet 3   irbesartan (AVAPRO) 75 MG tablet Take 1 tablet (75 mg total) by mouth daily. 90 tablet 1   Magnesium 400 MG TABS Take 400 mg by mouth daily.     metFORMIN (GLUCOPHAGE) 500 MG tablet Take 1 tablet (500 mg total) by mouth 2 (two) times daily with a meal. 180 tablet 1   metoprolol tartrate (LOPRESSOR) 100 MG tablet Take 1 tablet (100 mg total) by mouth 2 (two) times daily. 180 tablet 3   mycophenolate (CELLCEPT) 250 MG capsule Take 2 capsules by mouth in the morning and 2 capsules in the evening 360 capsule 3   pantoprazole (PROTONIX) 40 MG tablet Take 1 tablet (40 mg total) by mouth once daily 90 tablet 3   rosuvastatin (CRESTOR) 10 MG tablet Take 1 tablet (10 mg total) by mouth daily. (Patient taking differently: Take 10 mg by mouth at bedtime.) 90 tablet 3   sodium zirconium cyclosilicate  (LOKELMA) 10 g PACK packet Take 10 g (1 packet total) by mouth every Monday, Wednesday and Friday. Space 2 hours apart from Tacrolimus. 12 packet 6   tacrolimus (PROGRAF) 1 MG capsule Take 2 capsules (2 mg total) by mouth every morning AND 2 capsules (2 mg total) every evening. 360 capsule 3   triamcinolone cream (KENALOG) 0.1 % Apply 2 times a day to rash. Follow with CeraVe Cream.  Stop when smooth. (Patient taking differently:  Apply 1 Application topically daily as needed (Rash).) 80 g 2   Facility-Administered Medications Prior to Visit  Medication Dose Route Frequency Provider Last Rate Last Admin   heparin lock flush 100 unit/mL  500 Units Intracatheter Daily PRN Malachy Mood, MD       sodium chloride flush (NS) 0.9 % injection 10 mL  10 mL Intracatheter PRN Malachy Mood, MD        Allergies  Allergen Reactions   Niacinamide Itching    Improved upon discontinuation    Thiazide-Type Diuretics Other (See Comments)    Chlorthalidone caused gout   Quinolones Other (See Comments)    CONTRAINDICATED D/T PT HAVING AAA    Review of Systems As per HPI  PE:    07/21/2022    9:23 AM 04/20/2022    2:23 PM 04/05/2022    9:43 AM  Vitals with BMI  Height  5\' 11"  5\' 11"   Weight 235 lbs 10 oz 232 lbs 233 lbs 3 oz  BMI  32.37 32.54  Systolic 118 132 161  Diastolic 75 76 67  Pulse 63 71 62     Physical Exam  Gen: Alert, well appearing.  Patient is oriented to person, place, time, and situation. AFFECT: pleasant, lucid thought and speech. Abdomen: Soft, nontender.  He has a small midline ventral hernia directly under his healed surgical scar. Extremities: 3+ pitting edema on the left lower leg down into the foot, 2+ on the right.  LABS:  Last CBC Lab Results  Component Value Date   WBC 10.1 04/12/2022   HGB 14.4 04/12/2022   HCT 44 04/12/2022   MCV 84.3 02/25/2022   MCH 28.7 02/25/2022   RDW 16.3 (H) 02/25/2022   PLT 388 04/12/2022   Last metabolic panel Lab Results  Component  Value Date   GLUCOSE 134 (H) 04/20/2022   NA 134 (L) 04/20/2022   K 4.6 04/20/2022   CL 96 04/20/2022   CO2 29 04/20/2022   BUN 13 04/20/2022   CREATININE 1.38 04/20/2022   EGFR 56 04/12/2022   CALCIUM 9.6 04/20/2022   PHOS 2.9 07/29/2016   PROT 7.2 02/25/2022   ALBUMIN 4.4 04/12/2022   LABGLOB 2.4 10/29/2021   AGRATIO 2.0 10/29/2021   BILITOT 1.0 02/25/2022   ALKPHOS 141 (A) 04/12/2022   AST 12 (A) 04/12/2022   ALT 14 04/12/2022   ANIONGAP 7 02/25/2022   Last lipids Lab Results  Component Value Date   CHOL 114 01/13/2022   HDL 25.60 (L) 01/13/2022   LDLCALC 53 01/13/2022   LDLDIRECT 162.0 01/13/2021   TRIG 179.0 (H) 01/13/2022   CHOLHDL 4 01/13/2022   Last hemoglobin A1c Lab Results  Component Value Date   HGBA1C 5.5 07/21/2022   HGBA1C 5.5 07/21/2022   HGBA1C 5.5 (A) 07/21/2022   HGBA1C 5.5 07/21/2022   Last thyroid functions Lab Results  Component Value Date   TSH 1.54 01/13/2021   Last vitamin D Lab Results  Component Value Date   VD25OH 33 02/04/2021   Last vitamin B12 and Folate Lab Results  Component Value Date   VITAMINB12 227 02/25/2022   FOLATE 13.6 05/22/2016   IMPRESSION AND PLAN:  1 diabetes with nephropathy. Excellent control on metformin 500 mg twice daily. POC Hba1c today is 5.5%!  #2 hypertension, well-controlled on amlodipine 10 mg a day, irbesartan 75 mg a day, and Lopressor 100 mg twice a day.  3.  Chronic renal insufficiency stage III. He avoids NSAIDs. Electrolytes and creatinine today.  4.  Hypercholesterolemia, has been doing well on Crestor 10 mg a day. LDL was 53 about 6 months ago. Lipid panel and hepatic panel today.  5.  Ventral/incisional hernia.  Asymptomatic. Observation.  An After Visit Summary was printed and given to the patient.  FOLLOW UP: Return for routine chronic illness f/u. Next CPE 01/2023 Signed:  Santiago Bumpers, MD           07/21/2022

## 2022-07-22 ENCOUNTER — Telehealth: Payer: Self-pay

## 2022-07-22 DIAGNOSIS — N1831 Chronic kidney disease, stage 3a: Secondary | ICD-10-CM

## 2022-07-22 NOTE — Progress Notes (Signed)
Left pt VM to return call.

## 2022-07-22 NOTE — Telephone Encounter (Signed)
-----   Message from Jeoffrey Massed, MD sent at 07/22/2022  9:54 AM EDT ----- All labs excellent except kidney function has dropped just a little bit.  Nothing alarming.  No medication changes at this time. Return for nonfasting lab visit in 2 weeks to recheck basic metabolic panel and make sure this is not continuing to drop.  Dx chronic renal insufficiency stage 3a.

## 2022-07-27 ENCOUNTER — Ambulatory Visit (INDEPENDENT_AMBULATORY_CARE_PROVIDER_SITE_OTHER): Payer: Medicare Other

## 2022-07-27 VITALS — Wt 235.0 lb

## 2022-07-27 DIAGNOSIS — Z0000 Encounter for general adult medical examination without abnormal findings: Secondary | ICD-10-CM | POA: Diagnosis not present

## 2022-07-27 NOTE — Progress Notes (Signed)
Subjective:   William [name] is a 72 y.o. male who presents for Medicare Annual/Subsequent preventive examination.  Visit Complete: Virtual  I connected with  William [name] on 07/27/22 by a audio enabled telemedicine application and verified that I am speaking with the correct person using two identifiers.  Patient Location: Home  Provider Location: Home Office  I discussed the limitations of evaluation and management by telemedicine. The patient expressed understanding and agreed to proceed.  Patient Medicare AWV questionnaire was completed by the patient on 07/27/22; I have confirmed that all information answered by patient is correct and no changes since this date.  Review of Systems     Cardiac Risk Factors include: advanced age (>29men, >31 women);diabetes mellitus;dyslipidemia;hypertension;male gender;obesity (BMI >30kg/m2)     Objective:    Today's Vitals   07/27/22 1333  Weight: 235 lb (106.6 kg)   Body mass index is 32.78 kg/m.     07/27/2022    1:39 PM 01/07/2022    7:03 AM 07/21/2021    1:44 PM 08/28/2019   10:46 AM 06/27/2019    9:55 AM 03/07/2019    9:47 AM 08/21/2018   11:15 AM  Advanced Directives  Does Patient Have a Medical Advance Directive? Yes No Yes Yes No No No  Type of Estate agent of [place];Living will  Healthcare Power of [employer] of [place];Living will     Does patient want to make changes to medical advance directive?    No - Patient declined     Copy of Healthcare Power of Attorney in Chart? No - copy requested  No - copy requested      Would patient like information on creating a medical advance directive?  No - Patient declined   No - Patient declined No - Patient declined No - Patient declined    Current Medications (verified) Outpatient Encounter Medications as of 07/27/2022  Medication Sig   amLODipine (NORVASC) 10 MG tablet Take 1 tablet (10 mg total) by mouth daily.   ammonium lactate  (AMLACTIN) 12 % cream Apply 2 times daily to forearms and hands in preparation for treatment with Efudex (fluorouracil)   aspirin EC 81 MG tablet Take 81 mg by mouth daily. Swallow whole.   furosemide (LASIX) 20 MG tablet Take 1 tablet (20 mg total) by mouth daily.   irbesartan (AVAPRO) 75 MG tablet Take 1 tablet (75 mg total) by mouth daily.   Magnesium 400 MG TABS Take 400 mg by mouth daily.   metFORMIN (GLUCOPHAGE) 500 MG tablet Take 1 tablet (500 mg total) by mouth 2 (two) times daily with a meal.   metoprolol tartrate (LOPRESSOR) 100 MG tablet Take 1 tablet (100 mg total) by mouth 2 (two) times daily.   mycophenolate (CELLCEPT) 250 MG capsule Take 2 capsules by mouth in the morning and 2 capsules in the evening   pantoprazole (PROTONIX) 40 MG tablet Take 1 tablet (40 mg total) by mouth once daily   rosuvastatin (CRESTOR) 10 MG tablet Take 1 tablet (10 mg total) by mouth daily. (Patient taking differently: Take 10 mg by mouth at bedtime.)   sodium zirconium cyclosilicate (LOKELMA) 10 g PACK packet Take 10 g (1 packet total) by mouth every Monday, Wednesday and Friday. Space 2 hours apart from Tacrolimus.   tacrolimus (PROGRAF) 1 MG capsule Take 2 capsules (2 mg total) by mouth every morning AND 2 capsules (2 mg total) every evening.   triamcinolone cream (KENALOG) 0.1 % Apply 2 times  a day to rash. Follow with CeraVe Cream.  Stop when smooth. (Patient taking differently: Apply 1 Application topically daily as needed (Rash).)   [DISCONTINUED] azelastine (ASTELIN) 0.1 % nasal spray Place 2 sprays into both nostrils 2 (two) times daily   Facility-Administered Encounter Medications as of 07/27/2022  Medication   heparin lock flush 100 unit/mL   sodium chloride flush (NS) 0.9 % injection 10 mL    Allergies (verified) Niacinamide, Thiazide-type diuretics, and Quinolones   History: Past Medical History:  Diagnosis Date   AAA (abdominal aortic aneurysm) (HCC) 03/2014   repaired 2020    Ascending aortic aneurysm (HCC)    unrepaired. Stable at 4.3 cm on imaging by [facility] thoracic surg 10/2019 and 10/2020.   Bilateral renal cysts    simple (03/2014 MRI)   CAD (coronary artery disease)    Cholelithiases 2018   asymptomatic   Chronic diastolic heart failure (HCC)    Chronic renal insufficiency, stage 3 (moderate) (HCC) 2022   baseline sCr 1.3-1.5 (GFR 55)   COVID-19 virus infection 02/2020   sotrovimab infusion   Diabetes mellitus with complication (HCC) 07/2014   A1c 6.8%. A1c 5.4% 11/2019   Diverticulosis    Gout    initially dx'd by diag arthrocentesis of elbow effusion in winter 2021, 3 episodes, all came after being started on chlorthalidone->I d/c'd chlorthal.   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  Improving + off oxygen as of 3 mo s/p liver transplant.  Doing well/resolved as of 02/2020 [facility] pulm f/u->they'll continue to follow.   History of blood transfusion 2018 X 4 dates   Chronic GI blood loss of unknown location despite full GI endoscopic eval, etc   History of liver transplant (HCC) 07/2019   [facility]   Hyperlipemia, mixed    elevated LFTs when on statins.     Hypertension    Cr bump 04/01/16 so I changed benicar-hct to benicar plain and added amlodipine 5 mg.   Increased prostate specific antigen (PSA) velocity 2021   0.11 to 4.77 from 2020 to 2021 (he got a liver transplant and was put on antirejection meds between these psa checks).   Iron deficiency anemia 05/2016   Acute blood loss anemia: hospitalized, required transfusion x 3 U: colonoscopy and capsule study unrevealing.  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  W/u for ? hemolytic anemia to be pursued by hematologist as outpt.  Dr. Theodore [name], GI at [facility] following, too---he rec'd onc do bone marrow bx as of Jan 2019   Iron deficiency anemia due to chronic blood loss 2018/19   GI: transfusions x >20 required; multiple  endoscopies and bleeding scans unrevealing. Lysteda and octreotide + monthly iron infusions as of 04/2017. Iron infusions changed to every other month as of 09/2018 hem f/u.   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  Changes noted on all CXRs in 2018/2019.  Liver Transplant eval 03/2018->mild restriction on PFTs but no obstruction.  See further details in PMH "pulm fibrosis" section   Microscopic hematuria    Eval unremarkable by Dr. Hillis Range.   Nonmelanoma skin cancer 08/15/2018   2020 BCC nose, excised.  2022 R  side of neck->mohs   Obesity    Pulmonary fibrosis (HCC)    PFTs: restrictive lung dz ([facility] transplant eval 05/2018). [facility] pulm eval felt this was likely hepatopulmonary syndrome->causing his hypoxia->low likelihood of progression (as of 10/04/18 transplant clinic f/u). Pulm rehab helping as of 2020/2021. OFF oxygen as of 10/2019 transp f/u.   Spleen enlarged    Past Surgical History:  Procedure Laterality Date   ABDOMINAL AORTIC ANEURYSM REPAIR  08/23/2018   [facility]   CARDIAC CATHETERIZATION     CARDIOVASCULAR STRESS TEST  01/17/2012; 05/04/16   Normal stress nuclear study x 2 (2018--Normal perfusion. LVEF 66% with normal wall motion. This is a low risk study).   CERVICAL DISCECTOMY  1992   COLONOSCOPY N/A 05/27/2016   No site/explanation for blood loss found.  Erythematous mucosa in cecum and ascending colon--Cecal bx: normal.  Procedure: COLONOSCOPY;  Surgeon: Jeani Hawking, MD;  Location: [facility] ENDOSCOPY;  Service: Endoscopy;  Laterality: N/A;   COLONOSCOPY     01/07/22 diverticulosis but NO POLYPS.  No further recommended   COLONOSCOPY W/ POLYPECTOMY  09/10/2014   Polypectomy x 2: recall 5 yrs (Dr. Loreta Ave).   COLONOSCOPY WITH  PROPOFOL N/A 01/07/2022   Procedure: COLONOSCOPY WITH PROPOFOL;  Surgeon: Jeani Hawking, MD;  Location: [facility] ENDOSCOPY;  Service: Gastroenterology;  Laterality: N/A;   CORONARY ANGIOPLASTY     CORONARY ARTERY BYPASS GRAFT  03/2006   CT ABDOMEN WITH CONTRAST ([facility] HX)  11/2018   CT CHEST WWO CONTAST ([facility] HX)  07/11/2019   No significant change in interstitial lung disease with possible underlying hepatopulmonary syndrome, Increased ascites in the setting of cirrhosis, tiny new right basilar nodule   DEXA  03/07/2019   T score 0.2/NORMAL   ENTEROSCOPY N/A 07/31/2016   Procedure: ENTEROSCOPY;  Surgeon: Meryl Dare, MD;  Location: [facility] ENDOSCOPY;  Service: Endoscopy;  Laterality: N/A;   ENTEROSCOPY N/A 12/02/2016   Procedure: ENTEROSCOPY;  Surgeon: Jeani Hawking, MD;  Location: [facility] ENDOSCOPY;  Service: Endoscopy;  Laterality: N/A;   ESOPHAGOGASTRODUODENOSCOPY N/A 05/25/2016   No site/explanation for blood loss found.  Procedure: ESOPHAGOGASTRODUODENOSCOPY (EGD);  Surgeon: Charna Elizabeth, MD;  Location: [facility] ENDOSCOPY;  Service: Endoscopy;  Laterality: N/A;   ESOPHAGOGASTRODUODENOSCOPY N/A 09/23/2016   Procedure: ESOPHAGOGASTRODUODENOSCOPY (EGD);  Surgeon: Jeani Hawking, MD;  Location: [facility] ENDOSCOPY;  Service: Endoscopy;  Laterality: N/A;   ESOPHAGOGASTRODUODENOSCOPY (EGD) WITH PROPOFOL N/A 08/21/2018   Procedure: ESOPHAGOGASTRODUODENOSCOPY (EGD) WITH PROPOFOL;  Surgeon: Jeani Hawking, MD;  Location: [facility] ENDOSCOPY;  Service: Endoscopy;  Laterality: N/A;   GIVENS CAPSULE STUDY N/A 05/27/2016   No source identified.  Repeat 10/2016--results pending.  Procedure: GIVENS CAPSULE STUDY;  Surgeon: Jeani Hawking, MD;  Location[facility] ENDOSCOPY;  Service: Endoscopy;  Laterality: N/A;   GIVENS CAPSULE STUDY N/A 10/15/2016   Procedure: GIVENS CAPSULE STUDY;  Surgeon: Jeani Hawking, MD;  Location: [facility] ENDOSCOPY;  Service: Endoscopy;  Laterality: N/A;   GIVENS CAPSULE STUDY N/A 02/13/2017   Procedure: GIVENS CAPSULE  STUDY;  Surgeon: Jeani Hawking, MD;  Location: [facility] ENDOSCOPY;  Service: Endoscopy;  Laterality: N/A;   LIVER TRANSPLANT  08/02/2019   For NASH cirrhosis. [facility]   NECK SURGERY  1991   NM GI BLOOD LOSS  01/27/2017   NEGATIVE   SMALL BOWEL ENTEROSCOPY  10/2016   ([facility], Dr. Theodore [name]: Double balloon enteroscopy--to the level of proximal ileum) jejunal polyps- which were removed--not bleeding & benign.  No source of bleeding was identified.   TRANSTHORACIC ECHOCARDIOGRAM  05/25/2016; 10/2017   05/2016:  EF 60%, normal wall motion, grd II DD, mild aortic stenosis, dilated aortic root and ascending aorta. 10/2017: Mild LVH; no mention made of LV function.  Moderate aorticstenosis with mean gradient 19 peak gradient 41. AVA 1.1 cm^2.  05/2018 EF 55%,4.3 cm ascend aneur, mild AS. 10/2020 EF 60-65%, normal diast fxn, mod AV sclerosis w/out sten/regurg.   VASECTOMY  1977   Family History  Problem Relation Age of Onset   Hypertension Mother    Cancer - Other Mother        liver cancer   Heart disease Father    Heart attack Father    Cancer - Lung Father    Diabetes Father    Liver disease Sister    Anemia Sister    Heart disease Sister    Heart attack Sister    Heart disease Brother        CABG 20 YEARS AGO   Hypertension Brother    Mesothelioma Brother        HALF-BROTHER   Cancer - Other Brother        CLL   Cancer - Lung Brother        Mets to brain   Cancer - Lung Sister        HALF-SISTER   Liver disease Brother    Heart disease Brother        CABG-2012   Emphysema Maternal Grandfather    Cancer - Other Paternal Grandmother        Stomach   Heart attack Paternal Grandfather    Social History   Socioeconomic History   Marital status: Married    Spouse name: Not on file   Number of children: Not on file   Years of education: Not on file   Highest education level: 10th grade  Occupational History   Not on file  Tobacco Use   Smoking status: Former    Types: Cigarettes   Smokeless  tobacco: Never   Tobacco comments:    QUIT I 1983  Vaping Use   Vaping Use: Never used  Substance and Sexual Activity   Alcohol use: No   Drug use: No   Sexual activity: Yes  Other Topics Concern   Not on file  Social History Narrative   Married, 3 grown children, 3 GCs.   Educ: 10th grade.   Occupation: Retired Surveyor, minerals.   Tob: quit 1983, smoked about 20 pack-yr hx prior.   No alcohol.   Social Determinants of Health   Financial Resource Strain: Low Risk  (07/27/2022)   Overall Financial Resource Strain (CARDIA)    Difficulty of Paying Living Expenses: Not hard at all  Food Insecurity: No Food Insecurity (07/27/2022)   Hunger Vital Sign    Worried About Running Out of Food in the Last Year: Never true    Ran Out of Food in the Last Year: Never true  Transportation Needs: No Transportation Needs (07/27/2022)   PRAPARE - Administrator, Civil Service (Medical): No    Lack of Transportation (Non-Medical): No  Physical Activity: Inactive (07/27/2022)   Exercise Vital Sign    Days of Exercise per Week: 0 days    Minutes of Exercise per Session: 0 min  Stress: No Stress Concern Present (07/27/2022)   [employer] of Occupational Health - Occupational Stress Questionnaire    Feeling of Stress : Not at all  Social Connections: Socially Integrated (07/27/2022)   Social Connection and Isolation Panel [NHANES]    Frequency  of Communication with Friends and Family: More than three times a week    Frequency of Social Gatherings with Friends and Family: Three times a week    Attends Religious Services: More than 4 times per year    Active Member of Clubs or Organizations: Yes    Attends Engineer, structural: More than 4 times per year    Marital Status: Married    Tobacco Counseling Counseling given: Not Answered Tobacco comments: QUIT I 1983   Clinical Intake:  Pre-visit preparation completed: Yes  Pain : No/denies pain     BMI - recorded:  32.78 Nutritional Status: BMI > 30  Obese Nutritional Risks: None Diabetes: Yes CBG done?: Yes (120 per pt) CBG resulted in Enter/ Edit results?: No Did pt. bring in CBG monitor from home?: No  How often do you need to have someone help you when you read instructions, pamphlets, or other written materials from your doctor or pharmacy?: 1 - Never  Interpreter Needed?: No  Information entered by :: Lanier Ensign, LPN   Activities of Daily Living    07/27/2022    9:15 AM  In your present state of health, do you have any difficulty performing the following activities:  Hearing? 1  Comment slight loss  Vision? 0  Difficulty concentrating or making decisions? 0  Walking or climbing stairs? 0  Dressing or bathing? 0  Doing errands, shopping? 0  Preparing Food and eating ? N  Using the Toilet? N  In the past six months, have you accidently leaked urine? N  Do you have problems with loss of bowel control? N  Managing your Medications? N  Managing your Finances? N  Housekeeping or managing your Housekeeping? N    Patient Care Team: Jeoffrey Massed, MD as PCP - General (Family Medicine) Lennette Bihari, MD as PCP - Cardiology (Cardiology) Marcine Matar, MD as Consulting Physician (Urology) Lennette Bihari, MD as Consulting Physician (Cardiology) Johney Maine, MD as Consulting Physician (Hematology) William [name], Alden Benjamin, MD as Consulting Physician (Gastroenterology) Jeani Hawking, MD as Consulting Physician (Gastroenterology) Rege, Oletta Lamas, MD as Consulting Physician (General Surgery) Eben Burow, MD as Consulting Physician (Cardiothoracic Surgery) Blazing, Tyrone Apple, MD as Consulting Physician (Cardiology) Audrie Lia, MD as Consulting Physician (Internal Medicine) Jodi Geralds, MD as Consulting Physician (Orthopedic Surgery) Madlyn Frankel, MD as Consulting Physician (Dermatology) Johney Maine, MD as Consulting Physician  (Hematology)  Indicate any recent Medical Services you may have received from other than [facility] providers in the past year (date may be approximate).     Assessment:   This is a routine wellness examination for Kamarie.  Hearing/Vision screen Hearing Screening - Comments:: Pt stated slight loss  Vision Screening - Comments:: Encourage to follow up with provider   Dietary issues and exercise activities discussed:     Goals Addressed             This Visit's Progress    Patient Stated       Lose weight       Depression Screen    07/27/2022    1:38 PM 07/21/2022    9:28 AM 02/14/2022    1:10 PM 01/13/2022    8:38 AM 07/21/2021    1:42 PM 07/14/2021    8:08 AM 01/13/2021    9:01 AM  PHQ 2/9 Scores  PHQ - 2 Score 0  0 0 0 0 0  PHQ- 9 Score   0  Exception Documentation  Patient refusal         Fall Risk    07/27/2022    9:15 AM 07/17/2022    9:13 PM 01/13/2022    8:39 AM 07/21/2021    1:46 PM 07/14/2021    8:08 AM  Fall Risk   Falls in the past year? 0 0 0 0 0  Number falls in past yr:   0 0 0  Injury with Fall?   0 0 0  Risk for fall due to : Impaired vision  No Fall Risks Impaired vision   Follow up Falls prevention discussed  Falls evaluation completed Falls prevention discussed Falls evaluation completed    MEDICARE RISK AT HOME:   TIMED UP AND GO:  Was the test performed?  No    Cognitive Function:        07/27/2022    1:41 PM 07/21/2021    1:46 PM  6CIT Screen  What Year? 0 points 0 points  What month? 0 points 0 points  What time? 0 points 0 points  Count back from 20 0 points 0 points  Months in reverse 0 points 0 points  Repeat phrase 0 points 0 points  Total Score 0 points 0 points    Immunizations Immunization History  Administered Date(s) Administered   COVID-19, mRNA, vaccine(Comirnaty)12 years and older 02/03/2022   Fluad Quad(high Dose 65+) 10/08/2018, 11/25/2019, 10/14/2020, 12/16/2021   Hepatitis B, ADULT 05/16/2018, 07/26/2018,  11/29/2018, 05/21/2019   Hepatitis B, Dialysis 06/11/2019   Influenza, High Dose Seasonal PF 12/02/2015, 11/03/2016, 11/16/2017   Influenza-Unspecified 11/16/2017, 11/25/2019   Moderna Covid-19 Vaccine Bivalent Booster 43yrs & up 01/04/2021   Moderna SARS-COV2 Booster Vaccination 07/20/2020   Moderna Sars-Covid-2 Vaccination 03/08/2019, 04/05/2019, 10/24/2019   Pneumococcal Conjugate-13 04/01/2016   Pneumococcal Polysaccharide-23 04/05/2017   Tdap 06/29/2016   Zoster Recombinat (Shingrix) 10/06/2020, 12/11/2020    TDAP status: Up to date  Flu Vaccine status: Up to date  Pneumococcal vaccine status: Up to date   Covid-19 vaccine status: Completed vaccines  Qualifies for Shingles Vaccine? Yes   Zostavax completed Yes   Shingrix Completed?: Yes  Screening Tests Health Maintenance  Topic Date Due   OPHTHALMOLOGY EXAM  01/07/2017   COVID-19 Vaccine (6 - 2023-24 season) 08/03/2022 (Originally 03/31/2022)   INFLUENZA VACCINE  09/08/2022   Diabetic kidney evaluation - Urine ACR  01/14/2023   FOOT EXAM  01/14/2023   HEMOGLOBIN A1C  01/20/2023   Diabetic kidney evaluation - eGFR measurement  07/21/2023   DTaP/Tdap/Td (2 - Td or Tdap) 06/30/2026   Colonoscopy  01/08/2032   Pneumonia Vaccine 109+ Years old  Completed   Hepatitis C Screening  Completed   Zoster Vaccines- Shingrix  Completed   HPV VACCINES  Aged Out    Health Maintenance  Health Maintenance Due  Topic Date Due   OPHTHALMOLOGY EXAM  01/07/2017    Colorectal cancer screening: Type of screening: Colonoscopy. Completed 01/07/22. Repeat every 10 years   Additional Screening:  Hepatitis C Screening:  Completed 06/29/16  Vision Screening: Recommended annual ophthalmology exams for early detection of glaucoma and other disorders of the eye. Is the patient up to date with their annual eye exam?  No  Who is the provider or what is the name of the office in which the patient attends annual eye exams? Encouraged to  follow  If pt is not established with a provider, would they like to be referred to a provider to establish care? No .  Dental Screening: Recommended annual dental exams for proper oral hygiene  Diabetic Foot Exam: Diabetic Foot Exam: Completed 01/13/22  Community Resource Referral / Chronic Care Management: CRR required this visit?  No   CCM required this visit?  No     Plan:     I have personally reviewed and noted the following in the patient's chart:   Medical and social history Use of alcohol, tobacco or illicit drugs  Current medications and supplements including opioid prescriptions. Patient is not currently taking opioid prescriptions. Functional ability and status Nutritional status Physical activity Advanced directives List of other physicians Hospitalizations, surgeries, and ER visits in previous 12 months Vitals Screenings to include cognitive, depression, and falls Referrals and appointments  In addition, I have reviewed and discussed with patient certain preventive protocols, quality metrics, and best practice recommendations. A written personalized care plan for preventive services as well as general preventive health recommendations were provided to patient.     Marzella Schlein, LPN   [id number]   After Visit Summary: (MyChart) Due to this being a telephonic visit, the after visit summary with patients personalized plan was offered to patient via MyChart   Nurse Notes: none

## 2022-07-27 NOTE — Patient Instructions (Signed)
William [name] , Thank you for taking time to come for your Medicare Wellness Visit. I appreciate your ongoing commitment to your health goals. Please review the following plan we discussed and let me know if I can assist you in the future.   These are the goals we discussed:  Goals      Patient Stated     Lose weight and start exercise      Patient Stated     Lose weight        This is a list of the screening recommended for you and due dates:  Health Maintenance  Topic Date Due   Eye exam for diabetics  01/07/2017   COVID-19 Vaccine (6 - 2023-24 season) 08/03/2022*   Flu Shot  09/08/2022   Yearly kidney health urinalysis for diabetes  01/14/2023   Complete foot exam   01/14/2023   Hemoglobin A1C  01/20/2023   Yearly kidney function blood test for diabetes  07/21/2023   DTaP/Tdap/Td vaccine (2 - Td or Tdap) 06/30/2026   Colon Cancer Screening  01/08/2032   Pneumonia Vaccine  Completed   Hepatitis C Screening  Completed   Zoster (Shingles) Vaccine  Completed   HPV Vaccine  Aged Out  *Topic was postponed. The date shown is not the original due date.    Advanced directives: Please bring a copy of your health care power of attorney and living will to the office at your convenience.  Conditions/risks identified: lose weight   Next appointment: Follow up in one year for your annual wellness visit.   Preventive Care 73 Years and Older, Male  Preventive care refers to lifestyle choices and visits with your health care provider that can promote health and wellness. What does preventive care include? A yearly physical exam. This is also called an annual well check. Dental exams once or twice a year. Routine eye exams. Ask your health care provider how often you should have your eyes checked. Personal lifestyle choices, including: Daily care of your teeth and gums. Regular physical activity. Eating a healthy diet. Avoiding tobacco and drug use. Limiting alcohol use. Practicing  safe sex. Taking low doses of aspirin every day. Taking vitamin and mineral supplements as recommended by your health care provider. What happens during an annual well check? The services and screenings done by your health care provider during your annual well check will depend on your age, overall health, lifestyle risk factors, and family history of disease. Counseling  Your health care provider may ask you questions about your: Alcohol use. Tobacco use. Drug use. Emotional well-being. Home and relationship well-being. Sexual activity. Eating habits. History of falls. Memory and ability to understand (cognition). Work and work Astronomer. Screening  You may have the following tests or measurements: Height, weight, and BMI. Blood pressure. Lipid and cholesterol levels. These may be checked every 5 years, or more frequently if you are over 65 years old. Skin check. Lung cancer screening. You may have this screening every year starting at age 33 if you have a 30-pack-year history of smoking and currently smoke or have quit within the past 15 years. Fecal occult blood test (FOBT) of the stool. You may have this test every year starting at age 7. Flexible sigmoidoscopy or colonoscopy. You may have a sigmoidoscopy every 5 years or a colonoscopy every 10 years starting at age 25. Prostate cancer screening. Recommendations will vary depending on your family history and other risks. Hepatitis C blood test. Hepatitis B blood test. Sexually  transmitted disease (STD) testing. Diabetes screening. This is done by checking your blood sugar (glucose) after you have not eaten for a while (fasting). You may have this done every 1-3 years. Abdominal aortic aneurysm (AAA) screening. You may need this if you are a current or former smoker. Osteoporosis. You may be screened starting at age 31 if you are at high risk. Talk with your health care provider about your test results, treatment options, and  if necessary, the need for more tests. Vaccines  Your health care provider may recommend certain vaccines, such as: Influenza vaccine. This is recommended every year. Tetanus, diphtheria, and acellular pertussis (Tdap, Td) vaccine. You may need a Td booster every 10 years. Zoster vaccine. You may need this after age 54. Pneumococcal 13-valent conjugate (PCV13) vaccine. One dose is recommended after age 73. Pneumococcal polysaccharide (PPSV23) vaccine. One dose is recommended after age 73. Talk to your health care provider about which screenings and vaccines you need and how often you need them. This information is not intended to replace advice given to you by your health care provider. Make sure you discuss any questions you have with your health care provider. Document Released: 02/20/2015 Document Revised: 10/14/2015 Document Reviewed: 11/25/2014 Elsevier Interactive Patient Education  2017 [place] Prevention in the Home Falls can cause injuries. They can happen to people of all ages. There are many things you can do to make your home safe and to help prevent falls. What can I do on the outside of my home? Regularly fix the edges of walkways and driveways and fix any cracks. Remove anything that might make you trip as you walk through a door, such as a raised step or threshold. Trim any bushes or trees on the path to your home. Use bright outdoor lighting. Clear any walking paths of anything that might make someone trip, such as rocks or tools. Regularly check to see if handrails are loose or broken. Make sure that both sides of any steps have handrails. Any raised decks and porches should have guardrails on the edges. Have any leaves, snow, or ice cleared regularly. Use sand or salt on walking paths during winter. Clean up any spills in your garage right away. This includes oil or grease spills. What can I do in the bathroom? Use night lights. Install grab bars by the  toilet and in the tub and shower. Do not use towel bars as grab bars. Use non-skid mats or decals in the tub or shower. If you need to sit down in the shower, use a plastic, non-slip stool. Keep the floor dry. Clean up any water that spills on the floor as soon as it happens. Remove soap buildup in the tub or shower regularly. Attach bath mats securely with double-sided non-slip rug tape. Do not have throw rugs and other things on the floor that can make you trip. What can I do in the bedroom? Use night lights. Make sure that you have a light by your bed that is easy to reach. Do not use any sheets or blankets that are too big for your bed. They should not hang down onto the floor. Have a firm chair that has side arms. You can use this for support while you get dressed. Do not have throw rugs and other things on the floor that can make you trip. What can I do in the kitchen? Clean up any spills right away. Avoid walking on wet floors. Keep items that you use  a lot in easy-to-reach places. If you need to reach something above you, use a strong step stool that has a grab bar. Keep electrical cords out of the way. Do not use floor polish or wax that makes floors slippery. If you must use wax, use non-skid floor wax. Do not have throw rugs and other things on the floor that can make you trip. What can I do with my stairs? Do not leave any items on the stairs. Make sure that there are handrails on both sides of the stairs and use them. Fix handrails that are broken or loose. Make sure that handrails are as long as the stairways. Check any carpeting to make sure that it is firmly attached to the stairs. Fix any carpet that is loose or worn. Avoid having throw rugs at the top or bottom of the stairs. If you do have throw rugs, attach them to the floor with carpet tape. Make sure that you have a light switch at the top of the stairs and the bottom of the stairs. If you do not have them, ask someone  to add them for you. What else can I do to help prevent falls? Wear shoes that: Do not have high heels. Have rubber bottoms. Are comfortable and fit you well. Are closed at the toe. Do not wear sandals. If you use a stepladder: Make sure that it is fully opened. Do not climb a closed stepladder. Make sure that both sides of the stepladder are locked into place. Ask someone to hold it for you, if possible. Clearly mark and make sure that you can see: Any grab bars or handrails. First and last steps. Where the edge of each step is. Use tools that help you move around (mobility aids) if they are needed. These include: Canes. Walkers. Scooters. Crutches. Turn on the lights when you go into a dark area. Replace any light bulbs as soon as they burn out. Set up your furniture so you have a clear path. Avoid moving your furniture around. If any of your floors are uneven, fix them. If there are any pets around you, be aware of where they are. Review your medicines with your doctor. Some medicines can make you feel dizzy. This can increase your chance of falling. Ask your doctor what other things that you can do to help prevent falls. This information is not intended to replace advice given to you by your health care provider. Make sure you discuss any questions you have with your health care provider. Document Released: 11/20/2008 Document Revised: 07/02/2015 Document Reviewed: 02/28/2014 Elsevier Interactive Patient Education  2017 [employer].

## 2022-08-05 ENCOUNTER — Other Ambulatory Visit (INDEPENDENT_AMBULATORY_CARE_PROVIDER_SITE_OTHER): Payer: Medicare Other

## 2022-08-05 DIAGNOSIS — N1831 Chronic kidney disease, stage 3a: Secondary | ICD-10-CM | POA: Diagnosis not present

## 2022-08-05 LAB — BASIC METABOLIC PANEL
BUN: 14 mg/dL (ref 6–23)
CO2: 29 mEq/L (ref 19–32)
Calcium: 9.7 mg/dL (ref 8.4–10.5)
Chloride: 100 mEq/L (ref 96–112)
Creatinine, Ser: 1.38 mg/dL (ref 0.40–1.50)
GFR: 50.95 mL/min — ABNORMAL LOW (ref >60.00)
Glucose, Bld: 127 mg/dL — ABNORMAL HIGH (ref 70–99)
Potassium: 4.9 mEq/L (ref 3.5–5.1)
Sodium: 137 mEq/L (ref 135–145)

## 2022-08-05 NOTE — Progress Notes (Signed)
Pt presented for lab draw, tolerated well with no issues.

## 2022-08-10 ENCOUNTER — Other Ambulatory Visit: Payer: Self-pay

## 2022-08-10 ENCOUNTER — Other Ambulatory Visit (HOSPITAL_COMMUNITY): Payer: Self-pay

## 2022-08-17 ENCOUNTER — Other Ambulatory Visit (HOSPITAL_COMMUNITY): Payer: Self-pay

## 2022-08-17 ENCOUNTER — Other Ambulatory Visit: Payer: Self-pay

## 2022-08-17 DIAGNOSIS — I712 Thoracic aortic aneurysm, without rupture, unspecified: Secondary | ICD-10-CM | POA: Diagnosis not present

## 2022-08-17 DIAGNOSIS — I1310 Hypertensive heart and chronic kidney disease without heart failure, with stage 1 through stage 4 chronic kidney disease, or unspecified chronic kidney disease: Secondary | ICD-10-CM | POA: Diagnosis not present

## 2022-08-17 DIAGNOSIS — B334 Hantavirus (cardio)-pulmonary syndrome [HPS] [HCPS]: Secondary | ICD-10-CM | POA: Diagnosis not present

## 2022-08-17 DIAGNOSIS — M109 Gout, unspecified: Secondary | ICD-10-CM | POA: Diagnosis not present

## 2022-08-17 DIAGNOSIS — Z4823 Encounter for aftercare following liver transplant: Secondary | ICD-10-CM | POA: Diagnosis not present

## 2022-08-17 DIAGNOSIS — D84821 Immunodeficiency due to drugs: Secondary | ICD-10-CM | POA: Diagnosis not present

## 2022-08-17 DIAGNOSIS — I714 Abdominal aortic aneurysm, without rupture, unspecified: Secondary | ICD-10-CM | POA: Diagnosis not present

## 2022-08-17 DIAGNOSIS — Z5181 Encounter for therapeutic drug level monitoring: Secondary | ICD-10-CM | POA: Diagnosis not present

## 2022-08-17 DIAGNOSIS — Z79624 Long term (current) use of inhibitors of nucleotide synthesis: Secondary | ICD-10-CM | POA: Diagnosis not present

## 2022-08-17 DIAGNOSIS — E785 Hyperlipidemia, unspecified: Secondary | ICD-10-CM | POA: Diagnosis not present

## 2022-08-17 DIAGNOSIS — N189 Chronic kidney disease, unspecified: Secondary | ICD-10-CM | POA: Diagnosis not present

## 2022-08-17 DIAGNOSIS — Z944 Liver transplant status: Secondary | ICD-10-CM | POA: Diagnosis not present

## 2022-08-17 DIAGNOSIS — Z79899 Other long term (current) drug therapy: Secondary | ICD-10-CM | POA: Diagnosis not present

## 2022-08-17 DIAGNOSIS — I509 Heart failure, unspecified: Secondary | ICD-10-CM | POA: Diagnosis not present

## 2022-08-17 DIAGNOSIS — Z87891 Personal history of nicotine dependence: Secondary | ICD-10-CM | POA: Diagnosis not present

## 2022-08-17 DIAGNOSIS — D849 Immunodeficiency, unspecified: Secondary | ICD-10-CM | POA: Diagnosis not present

## 2022-08-17 DIAGNOSIS — Z23 Encounter for immunization: Secondary | ICD-10-CM | POA: Diagnosis not present

## 2022-08-17 DIAGNOSIS — M1990 Unspecified osteoarthritis, unspecified site: Secondary | ICD-10-CM | POA: Diagnosis not present

## 2022-08-17 DIAGNOSIS — E1122 Type 2 diabetes mellitus with diabetic chronic kidney disease: Secondary | ICD-10-CM | POA: Diagnosis not present

## 2022-08-17 LAB — LAB REPORT - SCANNED: EGFR: 45

## 2022-08-17 MED ORDER — TACROLIMUS 1 MG PO CAPS
ORAL_CAPSULE | ORAL | 3 refills | Status: DC
  Filled 2022-11-07: qty 270, 90d supply, fill #0
  Filled 2023-02-15: qty 270, 90d supply, fill #1
  Filled 2023-05-02: qty 270, 90d supply, fill #2
  Filled 2023-08-17: qty 270, 90d supply, fill #3

## 2022-08-21 ENCOUNTER — Encounter: Payer: Self-pay | Admitting: Family Medicine

## 2022-08-22 ENCOUNTER — Other Ambulatory Visit: Payer: Self-pay | Admitting: Cardiovascular Disease

## 2022-08-23 ENCOUNTER — Other Ambulatory Visit (HOSPITAL_COMMUNITY): Payer: Self-pay

## 2022-08-23 ENCOUNTER — Other Ambulatory Visit: Payer: Self-pay

## 2022-08-23 MED ORDER — ROSUVASTATIN CALCIUM 10 MG PO TABS
10.0000 mg | ORAL_TABLET | Freq: Every day | ORAL | 3 refills | Status: DC
  Filled 2022-08-23: qty 90, 90d supply, fill #0
  Filled 2022-11-19: qty 90, 90d supply, fill #1
  Filled 2023-02-08: qty 90, 90d supply, fill #2
  Filled 2023-05-24: qty 90, 90d supply, fill #3

## 2022-08-26 ENCOUNTER — Other Ambulatory Visit (HOSPITAL_COMMUNITY): Payer: Self-pay

## 2022-09-01 DIAGNOSIS — L720 Epidermal cyst: Secondary | ICD-10-CM | POA: Diagnosis not present

## 2022-09-01 DIAGNOSIS — L821 Other seborrheic keratosis: Secondary | ICD-10-CM | POA: Diagnosis not present

## 2022-09-01 DIAGNOSIS — Z85828 Personal history of other malignant neoplasm of skin: Secondary | ICD-10-CM | POA: Diagnosis not present

## 2022-09-01 DIAGNOSIS — L814 Other melanin hyperpigmentation: Secondary | ICD-10-CM | POA: Diagnosis not present

## 2022-09-01 DIAGNOSIS — D849 Immunodeficiency, unspecified: Secondary | ICD-10-CM | POA: Diagnosis not present

## 2022-09-01 DIAGNOSIS — D2270 Melanocytic nevi of unspecified lower limb, including hip: Secondary | ICD-10-CM | POA: Diagnosis not present

## 2022-09-01 DIAGNOSIS — D225 Melanocytic nevi of trunk: Secondary | ICD-10-CM | POA: Diagnosis not present

## 2022-09-01 DIAGNOSIS — D492 Neoplasm of unspecified behavior of bone, soft tissue, and skin: Secondary | ICD-10-CM | POA: Diagnosis not present

## 2022-09-01 DIAGNOSIS — D2260 Melanocytic nevi of unspecified upper limb, including shoulder: Secondary | ICD-10-CM | POA: Diagnosis not present

## 2022-09-01 DIAGNOSIS — L570 Actinic keratosis: Secondary | ICD-10-CM | POA: Diagnosis not present

## 2022-09-01 DIAGNOSIS — L738 Other specified follicular disorders: Secondary | ICD-10-CM | POA: Diagnosis not present

## 2022-09-01 DIAGNOSIS — L578 Other skin changes due to chronic exposure to nonionizing radiation: Secondary | ICD-10-CM | POA: Diagnosis not present

## 2022-09-01 DIAGNOSIS — D485 Neoplasm of uncertain behavior of skin: Secondary | ICD-10-CM | POA: Diagnosis not present

## 2022-09-02 ENCOUNTER — Other Ambulatory Visit: Payer: Self-pay

## 2022-09-02 DIAGNOSIS — Z944 Liver transplant status: Secondary | ICD-10-CM | POA: Diagnosis not present

## 2022-09-02 DIAGNOSIS — Z9482 Intestine transplant status: Secondary | ICD-10-CM | POA: Diagnosis not present

## 2022-09-02 DIAGNOSIS — D849 Immunodeficiency, unspecified: Secondary | ICD-10-CM | POA: Diagnosis not present

## 2022-09-02 DIAGNOSIS — B259 Cytomegaloviral disease, unspecified: Secondary | ICD-10-CM | POA: Diagnosis not present

## 2022-09-02 LAB — HEPATIC FUNCTION PANEL
ALT: 12 U/L (ref 10–40)
AST: 14 (ref 14–40)
Alkaline Phosphatase: 113 (ref 25–125)

## 2022-09-02 LAB — BASIC METABOLIC PANEL
BUN: 11 (ref 4–21)
CO2: 23 — AB (ref 13–22)
Chloride: 97 — AB (ref 99–108)
Creatinine: 1.3 (ref 0.6–1.3)
Glucose: 119
Potassium: 5.3 mEq/L — AB (ref 3.5–5.1)
Sodium: 136 — AB (ref 137–147)

## 2022-09-02 LAB — COMPREHENSIVE METABOLIC PANEL
Albumin: 4.6 (ref 3.5–5.0)
Calcium: 9.8 (ref 8.7–10.7)
Globulin: 2.2
eGFR: 57

## 2022-09-05 ENCOUNTER — Encounter: Payer: Self-pay | Admitting: Hematology

## 2022-09-05 ENCOUNTER — Encounter: Payer: Self-pay | Admitting: Family Medicine

## 2022-09-05 ENCOUNTER — Other Ambulatory Visit (HOSPITAL_COMMUNITY): Payer: Self-pay

## 2022-09-05 NOTE — Telephone Encounter (Signed)
Printed

## 2022-09-06 ENCOUNTER — Other Ambulatory Visit: Payer: Self-pay

## 2022-09-06 ENCOUNTER — Other Ambulatory Visit (HOSPITAL_COMMUNITY): Payer: Self-pay

## 2022-09-12 ENCOUNTER — Other Ambulatory Visit (HOSPITAL_COMMUNITY): Payer: Self-pay

## 2022-09-16 DIAGNOSIS — Z7901 Long term (current) use of anticoagulants: Secondary | ICD-10-CM | POA: Diagnosis not present

## 2022-09-16 DIAGNOSIS — Z9482 Intestine transplant status: Secondary | ICD-10-CM | POA: Diagnosis not present

## 2022-09-16 DIAGNOSIS — D849 Immunodeficiency, unspecified: Secondary | ICD-10-CM | POA: Diagnosis not present

## 2022-09-16 DIAGNOSIS — Z944 Liver transplant status: Secondary | ICD-10-CM | POA: Diagnosis not present

## 2022-09-20 ENCOUNTER — Other Ambulatory Visit: Payer: Medicare Other

## 2022-09-20 ENCOUNTER — Other Ambulatory Visit: Payer: Self-pay

## 2022-09-20 ENCOUNTER — Inpatient Hospital Stay: Payer: Medicare Other | Attending: Hematology | Admitting: Hematology

## 2022-09-20 VITALS — BP 123/72 | HR 72 | Temp 97.8°F | Resp 20 | Wt 223.9 lb

## 2022-09-20 DIAGNOSIS — Z79624 Long term (current) use of inhibitors of nucleotide synthesis: Secondary | ICD-10-CM | POA: Insufficient documentation

## 2022-09-20 DIAGNOSIS — Z944 Liver transplant status: Secondary | ICD-10-CM | POA: Insufficient documentation

## 2022-09-20 DIAGNOSIS — D72829 Elevated white blood cell count, unspecified: Secondary | ICD-10-CM | POA: Insufficient documentation

## 2022-09-20 DIAGNOSIS — Z79621 Long term (current) use of calcineurin inhibitor: Secondary | ICD-10-CM | POA: Insufficient documentation

## 2022-09-20 NOTE — Progress Notes (Signed)
HEMATOLOGY/ONCOLOGY CLINIC NOTE  Date of Service: 09/20/22  Patient Care Team: Jeoffrey Massed, MD as PCP - General (Family Medicine) Lennette Bihari, MD as PCP - Cardiology (Cardiology) Marcine Matar, MD as Consulting Physician (Urology) Lennette Bihari, MD as Consulting Physician (Cardiology) Johney Maine, MD as Consulting Physician (Hematology) Theodore Demark, Alden Benjamin, MD as Consulting Physician (Gastroenterology) Jeani Hawking, MD as Consulting Physician (Gastroenterology) Rege, Oletta Lamas, MD as Consulting Physician (General Surgery) Eben Burow, MD as Consulting Physician (Cardiothoracic Surgery) Blazing, Tyrone Apple, MD as Consulting Physician (Cardiology) Audrie Lia, MD as Consulting Physician (Internal Medicine) Jodi Geralds, MD as Consulting Physician (Orthopedic Surgery) Madlyn Frankel, MD as Consulting Physician (Dermatology) Johney Maine, MD as Consulting Physician (Hematology)  CHIEF COMPLAINTS/PURPOSE OF CONSULTATION:   Leukocytosis in patient with h/o liver transplantation.  HISTORY OF PRESENTING ILLNESS:   William [name] was previously followed for anemia primarily related to ongoing issues with GI bleeding and has not been controllable by multiple GI interventions in the context of NASH related liver cirrhosis.  He was last seen by me on 06/27/19 and has been referred back to [place] for evaluation of leucocytosis.  He has seen had a liver transplantation on 08/02/2019 and continues to f/u with his transplant team at [facility]. His anemia and GI bleeding issues have resolved since the transplantation. He is on tacrolimus and Cellcept for immunosupression.  He is folowing with [facility] dermatology for recurrent cutaneous SCC and BCC's and is using effudex topically.  He also continues to follow ith [facility] for concerns for Pulmonary fibrosis.  He's had a Covid-19 infection a couple of times, his last infection in September  2023. He has received medication for this infection and denies having many symptoms. No sudden weight loss or weight changes. No new medication besides his transplant medications. No unexpected, fever, night sweats, or chills. No prostate issues besides having slight elevation a few years ago. No abdominal pain or back pain.   He reports that the cyst on his back is stable and unchanged from baseline. He does not come in contact with any farm animals or pets. He has not traveled outside of the [place].   He reports that he's had several lesions in his head removed in May. He's also  had a skin lesion removed on his neck in October/November. He used humex cream previously, but hasn't used it in a while. He's also previously used Nectine lotion on his arms.  His wife reports that he has some difficulty hearing. She also reveals that he had an elevated potassium level 2 months ago. 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   He has used a nasal spray for the past 6 months for nasal allergies. He reports some improvement in symptoms. He is compliant with his Cellcept and tacrolimus medications. He reports that his tacrolmus levels were low recently. No concern for adjusting medications. He has his blood drawn every 2 weeks at [facility].   He denies any tooth pain, bone pain, urinary symptoms, or skin rashes. No recent drug allergies, although he notes a previous drug allergy reaction one year ago. Today, he is accompanied by his wife. He reports that he has had an elevated WBC since September/ October 2023. His wife notes that it has resulted at 12.5 K for the last month.   INTERVAL HISTORY: William [name] is here for continued evaluation and management of Leukocytosis.  I connected with the patient via phone  on 03/14/2022 and he was doing well overall.   Patient is accompanied by his wife during this visit. He notes he has been doing well overall without any new or severe medical concerns. Patient  notes he has been having mild skin rashes/spots throughout the body.   He denies any new infection issues, fever, chills, congestion, diarrhea, unexpected weight loss, new lumps/bumps, bone pain, chest pain, abdominal pain. He does complain of mild leg swelling.   Patient notes he regularly follows-up with his transplant team. Patient's transplant team has reduced his Cellcept to 2 capsules in the morning and 1 capsules at night.   Patient has some squamous and some basal cell carcinoma. He regularly follows-up with his dermatologist every 4 months regarding squamous and basal carcinoma.  MEDICAL HISTORY:  Past Medical History:  Diagnosis Date   AAA (abdominal aortic aneurysm) (HCC) 03/2014   repaired 2020   Ascending aortic aneurysm (HCC)    unrepaired. Stable at 4.3 cm on imaging by [facility] thoracic surg 10/2019 and 10/2020.   Bilateral renal cysts    simple (03/2014 MRI)   CAD (coronary artery disease)    Cholelithiases 2018   asymptomatic   Chronic diastolic heart failure (HCC)    Chronic renal insufficiency, stage 3 (moderate) (HCC) 2022   baseline sCr 1.3-1.5 (GFR 55)   COVID-19 virus infection 02/2020   sotrovimab infusion   Diabetes mellitus with complication (HCC) 07/2014   A1c 6.8%. A1c 5.4% 11/2019   Diverticulosis    Gout    initially dx'd by diag arthrocentesis of elbow effusion in winter 2021, 3 episodes, all came after being started on chlorthalidone->I d/c'd chlorthal.   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  Improving + off oxygen as of 3 mo s/p liver transplant.  Doing well/resolved as of 02/2020 [facility] pulm f/u->they'll continue to follow.   History of blood transfusion 2018 X 4 dates   Chronic GI blood loss of unknown location despite full GI endoscopic eval, etc   History of liver transplant (HCC) 07/2019   [facility]   Hyperlipemia, mixed    elevated LFTs when on statins.     Hypertension    Cr bump 04/01/16 so I changed benicar-hct to benicar plain and added  amlodipine 5 mg.   Increased prostate specific antigen (PSA) velocity 2021   0.11 to 4.77 from 2020 to 2021 (he got a liver transplant and was put on antirejection meds between these psa checks).   Iron deficiency anemia 05/2016   Acute blood loss anemia: hospitalized, required transfusion x 3 U: colonoscopy and capsule study unrevealing.  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  W/u for ? hemolytic anemia to be pursued by hematologist as outpt.  Dr. Theodore Demark, GI at [facility] following, too---he rec'd onc do bone marrow bx as of Jan 2019   Iron deficiency anemia due to chronic blood loss 2018/19   GI: transfusions x >20 required; multiple endoscopies and bleeding scans unrevealing. Lysteda and octreotide + monthly iron infusions as of 04/2017. Iron infusions changed to every other month as of 09/2018 hem f/u.   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  Changes noted on  all CXRs in 2018/2019.  Liver Transplant eval 03/2018->mild restriction on PFTs but no obstruction.  See further details in PMH "pulm fibrosis" section   Microscopic hematuria    Eval unremarkable by Dr. Hillis Range.   Nonmelanoma skin cancer 08/15/2018   2020 BCC nose, excised.  2022 R side of neck->mohs   Obesity    Pulmonary fibrosis (HCC)    PFTs: restrictive lung dz ([facility] transplant eval 05/2018). [facility] pulm eval felt this was likely hepatopulmonary syndrome->causing his hypoxia->low likelihood of progression (as of 10/04/18 transplant clinic f/u). Pulm rehab helping as of 2020/2021. OFF oxygen as of 10/2019 transp f/u.   Spleen enlarged     SURGICAL HISTORY: Past Surgical History:   Procedure Laterality Date   ABDOMINAL AORTIC ANEURYSM REPAIR  08/23/2018   [facility]   CARDIAC CATHETERIZATION     CARDIOVASCULAR STRESS TEST  01/17/2012; 05/04/16   Normal stress nuclear study x 2 (2018--Normal perfusion. LVEF 66% with normal wall motion. This is a low risk study).   CERVICAL DISCECTOMY  1992   COLONOSCOPY N/A 05/27/2016   No site/explanation for blood loss found.  Erythematous mucosa in cecum and ascending colon--Cecal bx: normal.  Procedure: COLONOSCOPY;  Surgeon: Jeani Hawking, MD;  Location: [facility] ENDOSCOPY;  Service: Endoscopy;  Laterality: N/A;   COLONOSCOPY     01/07/22 diverticulosis but NO POLYPS.  No further recommended   COLONOSCOPY W/ POLYPECTOMY  09/10/2014   Polypectomy x 2: recall 5 yrs (Dr. Loreta Ave).   COLONOSCOPY WITH PROPOFOL N/A 01/07/2022   Procedure: COLONOSCOPY WITH PROPOFOL;  Surgeon: Jeani Hawking, MD;  Location: [facility] ENDOSCOPY;  Service: Gastroenterology;  Laterality: N/A;   CORONARY ANGIOPLASTY     CORONARY ARTERY BYPASS GRAFT  03/2006   CT ABDOMEN WITH CONTRAST ([facility] HX)  11/2018   CT CHEST WWO CONTAST ([facility] HX)  07/11/2019   No significant change in interstitial lung disease with possible underlying hepatopulmonary syndrome, Increased ascites in the setting of cirrhosis, tiny new right basilar nodule   DEXA  03/07/2019   T score 0.2/NORMAL   ENTEROSCOPY N/A 07/31/2016   Procedure: ENTEROSCOPY;  Surgeon: Meryl Dare, MD;  Location: [facility] ENDOSCOPY;  Service: Endoscopy;  Laterality: N/A;   ENTEROSCOPY N/A 12/02/2016   Procedure: ENTEROSCOPY;  Surgeon: Jeani Hawking, MD;  Location: [facility] ENDOSCOPY;  Service: Endoscopy;  Laterality: N/A;   ESOPHAGOGASTRODUODENOSCOPY N/A 05/25/2016   No site/explanation for blood loss found.  Procedure: ESOPHAGOGASTRODUODENOSCOPY (EGD);  Surgeon: Charna Elizabeth, MD;  Location[facility] ENDOSCOPY;  Service: Endoscopy;  Laterality: N/A;   ESOPHAGOGASTRODUODENOSCOPY N/A 09/23/2016   Procedure: ESOPHAGOGASTRODUODENOSCOPY (EGD);  Surgeon:  Jeani Hawking, MD;  Location: [facility] ENDOSCOPY;  Service: Endoscopy;  Laterality: N/A;   ESOPHAGOGASTRODUODENOSCOPY (EGD) WITH PROPOFOL N/A 08/21/2018   Procedure: ESOPHAGOGASTRODUODENOSCOPY (EGD) WITH PROPOFOL;  Surgeon: Jeani Hawking, MD;  Location: [facility] ENDOSCOPY;  Service: Endoscopy;  Laterality: N/A;   GIVENS CAPSULE STUDY N/A 05/27/2016   No source identified.  Repeat 10/2016--results pending.  Procedure: GIVENS CAPSULE STUDY;  Surgeon: Jeani Hawking, MD;  Location: [facility] ENDOSCOPY;  Service: Endoscopy;  Laterality: N/A;   GIVENS CAPSULE STUDY N/A 10/15/2016   Procedure: GIVENS CAPSULE STUDY;  Surgeon: Jeani Hawking, MD;  Location: [facility] ENDOSCOPY;  Service: Endoscopy;  Laterality: N/A;   GIVENS CAPSULE STUDY N/A 02/13/2017   Procedure: GIVENS CAPSULE STUDY;  Surgeon: Jeani Hawking, MD;  Location: [facility] ENDOSCOPY;  Service: Endoscopy;  Laterality: N/A;   LIVER TRANSPLANT  08/02/2019   For NASH cirrhosis. [facility]   NECK SURGERY  1991  NM GI BLOOD LOSS  01/27/2017   NEGATIVE   SMALL BOWEL ENTEROSCOPY  10/2016   ([facility], Dr. Theodore Demark: Double balloon enteroscopy--to the level of proximal ileum) jejunal polyps- which were removed--not bleeding & benign.  No source of bleeding was identified.   TRANSTHORACIC ECHOCARDIOGRAM  05/25/2016; 10/2017   05/2016: EF 60%, normal wall motion, grd II DD, mild aortic stenosis, dilated aortic root and ascending aorta. 10/2017: Mild LVH; no mention made of LV function.  Moderate aorticstenosis with mean gradient 19 peak gradient 41. AVA 1.1 cm^2.  05/2018 EF 55%,4.3 cm ascend aneur, mild AS. 10/2020 EF 60-65%, normal diast fxn, mod AV sclerosis w/out sten/regurg.   VASECTOMY  1977    SOCIAL HISTORY: Social History   Socioeconomic History   Marital status: Married    Spouse name: Not on file   Number of children: Not on file   Years of education: Not on file   Highest education level: 10th grade  Occupational History   Not on file  Tobacco Use   Smoking status: Former    Types:  Cigarettes   Smokeless tobacco: Never   Tobacco comments:    QUIT I 1983  Vaping Use   Vaping status: Never Used  Substance and Sexual Activity   Alcohol use: No   Drug use: No   Sexual activity: Yes  Other Topics Concern   Not on file  Social History Narrative   Married, 3 grown children, 3 GCs.   Educ: 10th grade.   Occupation: Retired Surveyor, minerals.   Tob: quit 1983, smoked about 20 pack-yr hx prior.   No alcohol.   Social Determinants of Health   Financial Resource Strain: Low Risk  (07/17/2022)   Overall Financial Resource Strain (CARDIA)    Difficulty of Paying Living Expenses: Not hard at all  Food Insecurity: No Food Insecurity (07/17/2022)   Hunger Vital Sign    Worried About Running Out of Food in the Last Year: Never true    Ran Out of Food in the Last Year: Never true  Transportation Needs: No Transportation Needs (07/17/2022)   PRAPARE - Administrator, Civil Service (Medical): No    Lack of Transportation (Non-Medical): No  Physical Activity: Insufficiently Active (07/17/2022)   Exercise Vital Sign    Days of Exercise per Week: 5 days    Minutes of Exercise per Session: 10 min  Stress: No Stress Concern Present (07/17/2022)   [employer] of Occupational Health - Occupational Stress Questionnaire    Feeling of Stress : Not at all  Social Connections: Socially Integrated (07/27/2022)   Social Connection and Isolation Panel [NHANES]    Frequency of Communication with Friends and Family: More than three times a week    Frequency of Social Gatherings with Friends and Family: More than three times a week    Attends Religious Services: More than 4 times per year    Active Member of [employer] or Organizations: Yes    Attends Banker Meetings: More than 4 times per year    Marital Status: Married  Catering manager Violence: Not At Risk (07/27/2022)   Humiliation, Afraid, Rape, and Kick questionnaire    Fear of Current or Ex-Partner: No     Emotionally Abused: No    Physically Abused: No    Sexually Abused: No    FAMILY HISTORY: Family History  Problem Relation Age of Onset   Hypertension Mother    Cancer - Other Mother  liver cancer   Heart disease Father    Heart attack Father    Cancer - Lung Father    Diabetes Father    Liver disease Sister    Anemia Sister    Heart disease Sister    Heart attack Sister    Heart disease Brother        CABG 20 YEARS AGO   Hypertension Brother    Mesothelioma Brother        HALF-BROTHER   Cancer - Other Brother        CLL   Cancer - Lung Brother        Mets to brain   Cancer - Lung Sister        HALF-SISTER   Liver disease Brother    Heart disease Brother        CABG-2012   Emphysema Maternal Grandfather    Cancer - Other Paternal Grandmother        Stomach   Heart attack Paternal Grandfather     ALLERGIES:  is allergic to niacinamide, thiazide-type diuretics, and quinolones.  MEDICATIONS:  Current Outpatient Medications  Medication Sig Dispense Refill   amLODipine (NORVASC) 10 MG tablet Take 1 tablet (10 mg total) by mouth daily. 90 tablet 3   ammonium lactate (AMLACTIN) 12 % cream Apply 2 times daily to forearms and hands in preparation for treatment with Efudex (fluorouracil) 385 g 99   aspirin EC 81 MG tablet Take 81 mg by mouth daily. Swallow whole.     furosemide (LASIX) 20 MG tablet Take 1 tablet (20 mg total) by mouth daily. 90 tablet 3   irbesartan (AVAPRO) 75 MG tablet Take 1 tablet (75 mg total) by mouth daily. 90 tablet 1   Magnesium 400 MG TABS Take 400 mg by mouth daily.     metFORMIN (GLUCOPHAGE) 500 MG tablet Take 1 tablet (500 mg total) by mouth 2 (two) times daily with a meal. 180 tablet 1   metoprolol tartrate (LOPRESSOR) 100 MG tablet Take 1 tablet (100 mg total) by mouth 2 (two) times daily. 180 tablet 3   mycophenolate (CELLCEPT) 250 MG capsule Take 2 capsules by mouth in the morning and 2 capsules in the evening 360 capsule 3    pantoprazole (PROTONIX) 40 MG tablet Take 1 tablet (40 mg total) by mouth once daily 90 tablet 3   rosuvastatin (CRESTOR) 10 MG tablet Take 1 tablet (10 mg total) by mouth daily. 90 tablet 3   sodium zirconium cyclosilicate (LOKELMA) 10 g PACK packet Take 10 g (1 packet total) by mouth every Monday, Wednesday and Friday. Space 2 hours apart from Tacrolimus. 12 packet 6   tacrolimus (PROGRAF) 1 MG capsule Take 2 capsules (2 mg total) by mouth every morning AND 1 capsule (1 mg total) every evening. 270 capsule 3   triamcinolone cream (KENALOG) 0.1 % Apply 2 times a day to rash. Follow with CeraVe Cream.  Stop when smooth. (Patient taking differently: Apply 1 Application topically daily as needed (Rash).) 80 g 2   No current facility-administered medications for this visit.   Facility-Administered Medications Ordered in Other Visits  Medication Dose Route Frequency Provider Last Rate Last Admin   heparin lock flush 100 unit/mL  500 Units Intracatheter Daily PRN Malachy Mood, MD       sodium chloride flush (NS) 0.9 % injection 10 mL  10 mL Intracatheter PRN Malachy Mood, MD        REVIEW OF SYSTEMS:   10 Point review  of Systems was done is negative except as noted above.   PHYSICAL EXAMINATION:  .BP 123/72   Pulse 72   Temp 97.8 F (36.6 C)   Resp 20   Wt 223 lb 14.4 oz (101.6 kg)   SpO2 95%   BMI 31.23 kg/m   GENERAL:alert, in no acute distress and comfortable SKIN: no acute rashes, no significant lesions EYES: conjunctiva are pink and non-injected, sclera anicteric OROPHARYNX: MMM, no exudates, no oropharyngeal erythema or ulceration NECK: supple, no JVD LYMPH:  no palpable lymphadenopathy in the cervical, axillary or inguinal regions LUNGS: clear to auscultation b/l with normal respiratory effort HEART: regular rate & rhythm ABDOMEN:  normoactive bowel sounds , non tender, not distended. Extremity: no pedal edema PSYCH: alert & oriented x 3 with fluent speech NEURO: no focal  motor/sensory deficits  LABORATORY DATA:  I have reviewed the data as listed  .    Latest Ref Rng & Units 04/12/2022   12:00 AM 03/29/2022   12:00 AM 02/25/2022    9:57 AM  CBC  WBC  10.1     9.7     12.0   Hemoglobin 13.5 - 17.5 14.4     14.7     14.8   Hematocrit 41 - 53 44     46     43.5   Platelets 150 - 400 K/uL 388     372     345      This result is from an external source.   .CBC    Component Value Date/Time   WBC 10.1 04/12/2022 0000   WBC 12.0 (H) 02/25/2022 0957   WBC 12.5 (H) 01/13/2022 0907   RBC 5.19 (A) 04/12/2022 0000   HGB 14.4 04/12/2022 0000   HGB 14.8 02/25/2022 0957   HGB 8.7 (L) 02/10/2017 0948   HCT 44 04/12/2022 0000   HCT 27.2 (L) 02/10/2017 0948   PLT 388 04/12/2022 0000   PLT 345 02/25/2022 0957   PLT 103 (L) 02/10/2017 0948   PLT 125 (L) 07/28/2016 1416   MCV 84.3 02/25/2022 0957   MCV 82.1 02/10/2017 0948   MCH 28.7 02/25/2022 0957   MCHC 34.0 02/25/2022 0957   RDW 16.3 (H) 02/25/2022 0957   RDW 19.0 (H) 02/10/2017 0948   LYMPHSABS 1.3 02/25/2022 0957   LYMPHSABS 1.1 02/10/2017 0948   MONOABS 1.0 02/25/2022 0957   MONOABS 1.2 (H) 02/10/2017 0948   EOSABS 1.4 (H) 02/25/2022 0957   EOSABS 0.7 (H) 02/10/2017 0948   BASOSABS 0.1 02/25/2022 0957   BASOSABS 0.1 02/10/2017 0948        Latest Ref Rng & Units 09/02/2022   12:00 AM 08/05/2022    7:54 AM 07/21/2022   10:00 AM  CMP  Glucose 70 - 99 mg/dL  161  096   BUN 4 - 21 11     14  14    Creatinine 0.6 - 1.3 1.3     1.38  1.53   Sodium 137 - 147 136     137  135   Potassium 3.5 - 5.1 mEq/L 5.3     4.9  4.9   Chloride 99 - 108 97     100  98   CO2 13 - 22 23     29  29    Calcium 8.7 - 10.7 9.8     9.7  9.6   Total Protein 6.0 - 8.3 g/dL   7.2   Total Bilirubin 0.2 - 1.2 mg/dL  1.1   Alkaline Phos 25 - 125 113      99   AST 14 - 40 14      11   ALT 10 - 40 U/L 12      9      This result is from an external source.    Lab Results  Component Value Date   FERRITIN 64 02/25/2022    BCR/ABL-1 Results from 02/25/2022:   Sedimentation results from 02/25/2022:   JAK-2 results from 02/25/2022:   MPN FISH results:   C-reactive protein result from 02/25/2022:   RADIOGRAPHIC STUDIES: I have personally reviewed the radiological images as listed and agreed with the findings in the report. No results found.  ASSESSMENT & PLAN:   72 y.o. male with multiple medical comorbidities with Elita Boone with liver cirrhosis s/p liver transplantation in 07/2019 with   #1 Leucocytosis - mild Wbc 01/13/2022 - wbc 12.5k ANC 8.4k, Absolute eosinophilis 1.9k, immature grans 0.19k PLT 402k Normal Hgb/hct.  2) h/o Chronic blood loss anemia.- resolved post liver transplant.  3) Recurrent cutaneous SCC and BCC in the contaxt of immunosuppression needing recurrent excisions/ablations PLAN: -Discussed lab results from 09/16/2022, with the patient. CBC shows elevated WBC at 14.6 K and elevated Neutrophil at 11.6. CMP shows elevated glucose at 123, elevated creatinine at 1.33. -Continue to follow-up with dermatologist, transplant team, and PCP.  -RTC with Dr. Candise Che as needed.  FOLLOW UP: RTC with Dr Candise Che as needed RTC with PCP and Liver transplant team.   The total time spent in the appointment was 21 minutes* .  All of the patient's questions were answered with apparent satisfaction. The patient knows to call the clinic with any problems, questions or concerns.   Wyvonnia Lora MD MS AAHIVMS [facility] Hematology/Oncology Physician [facility]  .*Total Encounter Time as defined by the Centers for Medicare and Medicaid Services includes, in addition to the face-to-face time of a patient visit (documented in the note above) non-face-to-face time: obtaining and reviewing outside history, ordering and reviewing medications, tests or procedures, care coordination (communications with other health care professionals or caregivers) and documentation in the medical  record.   I,Param Shah,acting as a Neurosurgeon for Wyvonnia Lora, MD.,have documented all relevant documentation on the behalf of Wyvonnia Lora, MD,as directed by  Wyvonnia Lora, MD while in the presence of Wyvonnia Lora, MD.   .I have reviewed the above documentation for accuracy and completeness, and I agree with the above. Johney Maine MD

## 2022-09-22 ENCOUNTER — Encounter (INDEPENDENT_AMBULATORY_CARE_PROVIDER_SITE_OTHER): Payer: Self-pay

## 2022-09-26 ENCOUNTER — Encounter: Payer: Self-pay | Admitting: Hematology

## 2022-10-02 ENCOUNTER — Other Ambulatory Visit (HOSPITAL_COMMUNITY): Payer: Self-pay

## 2022-10-03 ENCOUNTER — Other Ambulatory Visit (HOSPITAL_COMMUNITY): Payer: Self-pay

## 2022-10-03 ENCOUNTER — Other Ambulatory Visit: Payer: Self-pay

## 2022-10-03 MED ORDER — PANTOPRAZOLE SODIUM 40 MG PO TBEC
40.0000 mg | DELAYED_RELEASE_TABLET | Freq: Every day | ORAL | 3 refills | Status: DC
  Filled 2022-10-03: qty 90, 90d supply, fill #0

## 2022-10-16 ENCOUNTER — Other Ambulatory Visit: Payer: Self-pay | Admitting: Family Medicine

## 2022-10-17 ENCOUNTER — Other Ambulatory Visit (HOSPITAL_COMMUNITY): Payer: Self-pay

## 2022-10-17 MED ORDER — METFORMIN HCL 500 MG PO TABS
500.0000 mg | ORAL_TABLET | Freq: Two times a day (BID) | ORAL | 0 refills | Status: DC
  Filled 2022-10-17 – 2022-10-18 (×2): qty 60, 30d supply, fill #0

## 2022-10-18 ENCOUNTER — Other Ambulatory Visit (HOSPITAL_BASED_OUTPATIENT_CLINIC_OR_DEPARTMENT_OTHER): Payer: Self-pay

## 2022-10-18 ENCOUNTER — Other Ambulatory Visit: Payer: Self-pay

## 2022-10-18 ENCOUNTER — Other Ambulatory Visit (HOSPITAL_COMMUNITY): Payer: Self-pay

## 2022-10-18 DIAGNOSIS — Z7901 Long term (current) use of anticoagulants: Secondary | ICD-10-CM | POA: Diagnosis not present

## 2022-10-18 DIAGNOSIS — Z9482 Intestine transplant status: Secondary | ICD-10-CM | POA: Diagnosis not present

## 2022-10-18 DIAGNOSIS — Z944 Liver transplant status: Secondary | ICD-10-CM | POA: Diagnosis not present

## 2022-10-18 DIAGNOSIS — D849 Immunodeficiency, unspecified: Secondary | ICD-10-CM | POA: Diagnosis not present

## 2022-10-18 LAB — CBC AND DIFFERENTIAL
HCT: 46 (ref 41–53)
Hemoglobin: 14.2 (ref 13.5–17.5)
Neutrophils Absolute: 7.8
Platelets: 378 10*3/uL (ref 150–400)
WBC: 10.9

## 2022-10-18 LAB — BASIC METABOLIC PANEL
BUN: 15 (ref 4–21)
CO2: 22 (ref 13–22)
Chloride: 99 (ref 99–108)
Creatinine: 1.5 — AB (ref 0.6–1.3)
Glucose: 116
Potassium: 5.6 meq/L — AB (ref 3.5–5.1)
Sodium: 136 — AB (ref 137–147)

## 2022-10-18 LAB — CBC: RBC: 5.3 — AB (ref 3.87–5.11)

## 2022-10-18 LAB — COMPREHENSIVE METABOLIC PANEL
Albumin: 4.4 (ref 3.5–5.0)
Calcium: 9.7 (ref 8.7–10.7)
Globulin: 2.3
eGFR: 49

## 2022-10-18 LAB — POCT INR: INR: 1 (ref 0.80–1.20)

## 2022-10-18 LAB — HEPATIC FUNCTION PANEL
ALT: 12 U/L (ref 10–40)
AST: 12 — AB (ref 14–40)
Alkaline Phosphatase: 101 (ref 25–125)
Bilirubin, Total: 0.7

## 2022-10-18 MED ORDER — COMIRNATY 30 MCG/0.3ML IM SUSY
0.3000 mL | PREFILLED_SYRINGE | Freq: Once | INTRAMUSCULAR | 0 refills | Status: AC
  Filled 2022-10-18: qty 0.3, 1d supply, fill #0

## 2022-10-21 ENCOUNTER — Other Ambulatory Visit (HOSPITAL_COMMUNITY): Payer: Self-pay

## 2022-10-21 ENCOUNTER — Other Ambulatory Visit: Payer: Self-pay

## 2022-10-21 MED ORDER — LOKELMA 10 G PO PACK
PACK | ORAL | 6 refills | Status: DC
  Filled 2022-10-21: qty 30, 30d supply, fill #0

## 2022-10-23 ENCOUNTER — Encounter: Payer: Self-pay | Admitting: Family Medicine

## 2022-10-24 ENCOUNTER — Ambulatory Visit (INDEPENDENT_AMBULATORY_CARE_PROVIDER_SITE_OTHER): Payer: Medicare Other | Admitting: Family Medicine

## 2022-10-24 ENCOUNTER — Encounter: Payer: Self-pay | Admitting: Family Medicine

## 2022-10-24 ENCOUNTER — Other Ambulatory Visit (HOSPITAL_COMMUNITY): Payer: Self-pay

## 2022-10-24 VITALS — BP 117/77 | HR 67 | Wt 236.4 lb

## 2022-10-24 DIAGNOSIS — I10 Essential (primary) hypertension: Secondary | ICD-10-CM | POA: Diagnosis not present

## 2022-10-24 DIAGNOSIS — Z7984 Long term (current) use of oral hypoglycemic drugs: Secondary | ICD-10-CM | POA: Diagnosis not present

## 2022-10-24 DIAGNOSIS — Z23 Encounter for immunization: Secondary | ICD-10-CM | POA: Diagnosis not present

## 2022-10-24 DIAGNOSIS — N1831 Chronic kidney disease, stage 3a: Secondary | ICD-10-CM

## 2022-10-24 DIAGNOSIS — E1121 Type 2 diabetes mellitus with diabetic nephropathy: Secondary | ICD-10-CM

## 2022-10-24 LAB — POCT GLYCOSYLATED HEMOGLOBIN (HGB A1C)
HbA1c POC (<> result, manual entry): 5.7 % (ref 4.0–5.6)
HbA1c, POC (controlled diabetic range): 5.7 % (ref 0.0–7.0)
HbA1c, POC (prediabetic range): 5.7 % (ref 5.7–6.4)
Hemoglobin A1C: 5.7 % — AB (ref 4.0–5.6)

## 2022-10-24 MED ORDER — IRBESARTAN 75 MG PO TABS
75.0000 mg | ORAL_TABLET | Freq: Every day | ORAL | 1 refills | Status: DC
  Filled 2022-10-24 – 2022-11-17 (×2): qty 90, 90d supply, fill #0

## 2022-10-24 NOTE — Telephone Encounter (Signed)
printed

## 2022-10-24 NOTE — Telephone Encounter (Signed)
Labs abstracted

## 2022-10-24 NOTE — Progress Notes (Signed)
OFFICE VISIT  10/24/2022  CC:  Chief Complaint  Patient presents with   Medical Management of Chronic Issues    Patient is a 72 y.o. male who presents for 41-month follow-up diabetes, hypertension, and chronic renal insufficiency stage III. A/P as of last visit: "1 diabetes with nephropathy. Excellent control on metformin 500 mg twice daily. POC Hba1c today is 5.5%!   #2 hypertension, well-controlled on amlodipine 10 mg a day, irbesartan 75 mg a day, and Lopressor 100 mg twice a day.   3.  Chronic renal insufficiency stage III. He avoids NSAIDs. Electrolytes and creatinine today.   4.  Hypercholesterolemia, has been doing well on Crestor 10 mg a day. LDL was 53 about 6 months ago. Lipid panel and hepatic panel today.   5.  Ventral/incisional hernia.  Asymptomatic. Observation."  INTERIM HX: He feels well. At most recent specialist visit his blood pressure was low so his Lopressor was decreased to 50 mg twice a day. Home blood pressures normal.  Home glucose is around 120 fasting average.  He admits he is not exercising much at all.  He feels like he is avoiding simple carbohydrates well.  Reviewed labs done by transplant clinic on 10/18/2022: White blood cell count 10.9, hemoglobin 14.2, platelets 378.  Serum creatinine 1.50, GFR 49, potassium 5.6. Tacrolimus level 4.0.  ROS as above, plus--> no fevers, no CP, no SOB, no wheezing, no cough, no dizziness, no HAs, no rashes, no melena/hematochezia.  No polyuria or polydipsia.  No myalgias or arthralgias.  No focal weakness, paresthesias, or tremors.  No acute vision or hearing abnormalities.  No dysuria or unusual/new urinary urgency or frequency.  No recent changes in lower legs. No n/v/d or abd pain.  No palpitations.     Past Medical History:  Diagnosis Date   AAA (abdominal aortic aneurysm) (HCC) 03/2014   repaired 2020   Ascending aortic aneurysm (HCC)    unrepaired. Stable at 4.3 cm on imaging by [facility] thoracic  surg 10/2019 and 10/2020.   Bilateral renal cysts    simple (03/2014 MRI)   CAD (coronary artery disease)    Cholelithiases 2018   asymptomatic   Chronic diastolic heart failure (HCC)    Chronic renal insufficiency, stage 3 (moderate) (HCC) 2022   baseline sCr 1.3-1.5 (GFR 55)   COVID-19 virus infection 02/2020   sotrovimab infusion   Diabetes mellitus with complication (HCC) 07/2014   A1c 6.8%. A1c 5.4% 11/2019   Diverticulosis    Gout    initially dx'd by diag arthrocentesis of elbow effusion in winter 2021, 3 episodes, all came after being started on chlorthalidone->I d/c'd chlorthal.   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  Improving + off oxygen as of 3 mo s/p liver transplant.  Doing well/resolved as of 02/2020 [facility] pulm f/u->they'll continue to follow.   History of blood transfusion 2018 X 4 dates   Chronic GI blood loss of unknown location despite full GI endoscopic eval, etc   History of liver transplant (HCC) 07/2019   [facility]   Hyperlipemia, mixed    elevated LFTs when on statins.     Hypertension    Cr bump 04/01/16 so I changed benicar-hct to benicar plain and added amlodipine 5 mg.   Increased prostate specific antigen (PSA) velocity 2021   0.11 to 4.77 from 2020 to 2021 (he got a liver transplant and was put on antirejection meds between these psa checks).   Iron deficiency anemia 05/2016   Acute blood loss  anemia: hospitalized, required transfusion x 3 U: colonoscopy and capsule study unrevealing.  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  W/u for ? hemolytic anemia to be pursued by hematologist as outpt.  Dr. Theodore Demark, GI at [facility] following, too---he rec'd onc do bone marrow bx as of Jan 2019   Iron deficiency anemia due to chronic blood loss 2018/19   GI: transfusions x >20 required; multiple endoscopies and bleeding scans unrevealing. Lysteda and octreotide + monthly iron infusions  as of 04/2017. Iron infusions changed to every other month as of 09/2018 hem f/u.   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  Changes noted on all CXRs in 2018/2019.  Liver Transplant eval 03/2018->mild restriction on PFTs but no obstruction.  See further details in PM[facility] "pulm fibrosis" section   Microscopic hematuria    Eval unremarkable by Dr. Hillis Range.   Nonmelanoma skin cancer 08/15/2018   2020 BCC nose, excised.  2022 R side of neck->mohs   Obesity    Pulmonary fibrosis (HCC)    PFTs: restrictive lung dz ([facility] transplant eval 05/2018). [facility] pulm eval felt this was likely hepatopulmonary syndrome->causing his hypoxia->low likelihood of progression (as of 10/04/18 transplant clinic f/u). Pulm rehab helping as of 2020/2021. OFF oxygen as of 10/2019 transp f/u.   Spleen enlarged     Past Surgical History:  Procedure Laterality Date   ABDOMINAL AORTIC ANEURYSM REPAIR  08/23/2018   [facility]   CARDIAC CATHETERIZATION     CARDIOVASCULAR STRESS TEST  01/17/2012; 05/04/16   Normal stress nuclear study x 2 (2018--Normal perfusion. LVEF 66% with normal wall motion. This is a low risk study).   CERVICAL DISCECTOMY  1992   COLONOSCOPY N/A 05/27/2016   No site/explanation for blood loss found.  Erythematous mucosa in cecum and ascending colon--Cecal bx: normal.  Procedure: COLONOSCOPY;  Surgeon: Jeani Hawking, MD;  Location: [facility] ENDOSCOPY;  Service: Endoscopy;  Laterality: N/A;   COLONOSCOPY     01/07/22 diverticulosis but NO POLYPS.  No further recommended   COLONOSCOPY W/ POLYPECTOMY  09/10/2014   Polypectomy x 2: recall 5 yrs (Dr. Loreta Ave).   COLONOSCOPY WITH PROPOFOL N/A 01/07/2022   Procedure: COLONOSCOPY WITH PROPOFOL;  Surgeon: Jeani Hawking,  MD;  Location: [facility] ENDOSCOPY;  Service: Gastroenterology;  Laterality: N/A;   CORONARY ANGIOPLASTY     CORONARY ARTERY BYPASS GRAFT  03/2006   CT ABDOMEN WITH CONTRAST ([facility] HX)  11/2018   CT CHEST WWO CONTAST ([facility] HX)  07/11/2019   No significant change in interstitial lung disease with possible underlying hepatopulmonary syndrome, Increased ascites in the setting of cirrhosis, tiny new right basilar nodule   DEXA  03/07/2019   T score 0.2/NORMAL   ENTEROSCOPY N/A 07/31/2016   Procedure: ENTEROSCOPY;  Surgeon: Meryl Dare, MD;  Location: [facility] ENDOSCOPY;  Service: Endoscopy;  Laterality: N/A;   ENTEROSCOPY N/A 12/02/2016   Procedure: ENTEROSCOPY;  Surgeon: Jeani Hawking, MD;  Location: [facility] ENDOSCOPY;  Service: Endoscopy;  Laterality: N/A;   ESOPHAGOGASTRODUODENOSCOPY N/A 05/25/2016   No site/explanation for blood loss found.  Procedure: ESOPHAGOGASTRODUODENOSCOPY (EGD);  Surgeon: Charna Elizabeth, MD;  Location: [facility] ENDOSCOPY;  Service: Endoscopy;  Laterality: N/A;  ESOPHAGOGASTRODUODENOSCOPY N/A 09/23/2016   Procedure: ESOPHAGOGASTRODUODENOSCOPY (EGD);  Surgeon: Jeani Hawking, MD;  Location: [facility] ENDOSCOPY;  Service: Endoscopy;  Laterality: N/A;   ESOPHAGOGASTRODUODENOSCOPY (EGD) WITH PROPOFOL N/A 08/21/2018   Procedure: ESOPHAGOGASTRODUODENOSCOPY (EGD) WITH PROPOFOL;  Surgeon: Jeani Hawking, MD;  Location: [facility] ENDOSCOPY;  Service: Endoscopy;  Laterality: N/A;   GIVENS CAPSULE STUDY N/A 05/27/2016   No source identified.  Repeat 10/2016--results pending.  Procedure: GIVENS CAPSULE STUDY;  Surgeon: Jeani Hawking, MD;  Location: [facility] ENDOSCOPY;  Service: Endoscopy;  Laterality: N/A;   GIVENS CAPSULE STUDY N/A 10/15/2016   Procedure: GIVENS CAPSULE STUDY;  Surgeon: Jeani Hawking, MD;  Location: [facility] ENDOSCOPY;  Service: Endoscopy;  Laterality: N/A;   GIVENS CAPSULE STUDY N/A 02/13/2017   Procedure: GIVENS CAPSULE STUDY;  Surgeon: Jeani Hawking, MD;  Location: [facility] ENDOSCOPY;  Service: Endoscopy;  Laterality:  N/A;   LIVER TRANSPLANT  08/02/2019   For NASH cirrhosis. [facility]   NECK SURGERY  1991   NM GI BLOOD LOSS  01/27/2017   NEGATIVE   SMALL BOWEL ENTEROSCOPY  10/2016   ([facility], Dr. Theodore Demark: Double balloon enteroscopy--to the level of proximal ileum) jejunal polyps- which were removed--not bleeding & benign.  No source of bleeding was identified.   TRANSTHORACIC ECHOCARDIOGRAM  05/25/2016; 10/2017   05/2016: EF 60%, normal wall motion, grd II DD, mild aortic stenosis, dilated aortic root and ascending aorta. 10/2017: Mild LVH; no mention made of LV function.  Moderate aorticstenosis with mean gradient 19 peak gradient 41. AVA 1.1 cm^2.  05/2018 EF 55%,4.3 cm ascend aneur, mild AS. 10/2020 EF 60-65%, normal diast fxn, mod AV sclerosis w/out sten/regurg.   VASECTOMY  1977    Outpatient Medications Prior to Visit  Medication Sig Dispense Refill   amLODipine (NORVASC) 10 MG tablet Take 1 tablet (10 mg total) by mouth daily. 90 tablet 3   ammonium lactate (AMLACTIN) 12 % cream Apply 2 times daily to forearms and hands in preparation for treatment with Efudex (fluorouracil) 385 g 99   aspirin EC 81 MG tablet Take 81 mg by mouth daily. Swallow whole.     furosemide (LASIX) 20 MG tablet Take 1 tablet (20 mg total) by mouth daily. 90 tablet 3   Magnesium 400 MG TABS Take 400 mg by mouth daily.     metFORMIN (GLUCOPHAGE) 500 MG tablet Take 1 tablet (500 mg total) by mouth 2 (two) times daily with a meal. 60 tablet 0   metoprolol tartrate (LOPRESSOR) 100 MG tablet Take 1 tablet (100 mg total) by mouth 2 (two) times daily. (Patient taking differently: Take 100 mg by mouth 2 (two) times daily. 1/2 TAB) 180 tablet 3   mycophenolate (CELLCEPT) 250 MG capsule Take 2 capsules by mouth in the morning and 2 capsules in the evening 360 capsule 3   pantoprazole (PROTONIX) 40 MG tablet Take 1 tablet (40 mg total) by mouth once daily 90 tablet 3   pantoprazole (PROTONIX) 40 MG tablet Take 1 tablet (40 mg total) by mouth daily. 90  tablet 3   rosuvastatin (CRESTOR) 10 MG tablet Take 1 tablet (10 mg total) by mouth daily. 90 tablet 3   sodium zirconium cyclosilicate (LOKELMA) 10 g PACK packet Take 1 packet (10 g total) by mouth once daily 30 packet 6   tacrolimus (PROGRAF) 1 MG capsule Take 2 capsules (2 mg total) by mouth every morning AND 1 capsule (1 mg total) every evening. 270 capsule 3   triamcinolone cream (KENALOG) 0.1 % Apply 2 times a day  to rash. Follow with CeraVe Cream.  Stop when smooth. (Patient taking differently: Apply 1 Application topically daily as needed (Rash).) 80 g 2   irbesartan (AVAPRO) 75 MG tablet Take 1 tablet (75 mg total) by mouth daily. 90 tablet 1   Facility-Administered Medications Prior to Visit  Medication Dose Route Frequency Provider Last Rate Last Admin   heparin lock flush 100 unit/mL  500 Units Intracatheter Daily PRN Malachy Mood, MD       sodium chloride flush (NS) 0.9 % injection 10 mL  10 mL Intracatheter PRN Malachy Mood, MD        Allergies  Allergen Reactions   Niacinamide Itching    Improved upon discontinuation    Thiazide-Type Diuretics Other (See Comments)    Chlorthalidone caused gout   Quinolones Other (See Comments)    CONTRAINDICATED D/T PT HAVING AAA    Review of Systems As per HPI  PE:    10/24/2022    8:39 AM 09/20/2022   10:30 AM 07/27/2022    1:33 PM  Vitals with BMI  Weight 236 lbs 6 oz 223 lbs 14 oz 235 lbs  Systolic 117 123   Diastolic 77 72   Pulse 67 72      Physical Exam  Gen: Alert, well appearing.  Patient is oriented to person, place, time, and situation. AFFECT: pleasant, lucid thought and speech. Cardiovascular exam: Regular rhythm and rate, 2-3/6 systolic murmur, no diastolic murmur. Lungs show bibasilar inspiratory crackles, extending halfway up on the left but just in the base on the right.  Breathing nonlabored.  Good aeration. Extremities show just trace pitting edema on the left and no pitting edema on the right.  LABS:  Last  CBC Lab Results  Component Value Date   WBC 10.9 10/18/2022   HGB 14.2 10/18/2022   HCT 46 10/18/2022   MCV 84.3 02/25/2022   MCH 28.7 02/25/2022   RDW 16.3 (H) 02/25/2022   PLT 378 10/18/2022   Last metabolic panel Lab Results  Component Value Date   GLUCOSE 127 (H) 08/05/2022   NA 136 (A) 10/18/2022   K 5.6 (A) 10/18/2022   CL 99 10/18/2022   CO2 22 10/18/2022   BUN 15 10/18/2022   CREATININE 1.5 (A) 10/18/2022   EGFR 49 10/18/2022   CALCIUM 9.7 10/18/2022   PHOS 2.9 07/29/2016   PROT 7.2 07/21/2022   ALBUMIN 4.4 10/18/2022   LABGLOB 2.4 10/29/2021   AGRATIO 2.0 10/29/2021   BILITOT 1.1 07/21/2022   ALKPHOS 101 10/18/2022   AST 12 (A) 10/18/2022   ALT 12 10/18/2022   ANIONGAP 7 02/25/2022   Last lipids Lab Results  Component Value Date   CHOL 94 07/21/2022   HDL 25.90 (L) 07/21/2022   LDLCALC 41 07/21/2022   LDLDIRECT 162.0 01/13/2021   TRIG 136.0 07/21/2022   CHOLHDL 4 07/21/2022   Last hemoglobin A1c Lab Results  Component Value Date   HGBA1C 5.7 (A) 10/24/2022   HGBA1C 5.7 10/24/2022   HGBA1C 5.7 10/24/2022   HGBA1C 5.7 10/24/2022   Last thyroid functions Lab Results  Component Value Date   TSH 1.54 01/13/2021   Last vitamin D Lab Results  Component Value Date   VD25OH 33 02/04/2021   Last vitamin B12 and Folate Lab Results  Component Value Date   VITAMINB12 227 02/25/2022   FOLATE 13.6 05/22/2016   IMPRESSION AND PLAN:  #1 diabetes with nephropathy, good control. POC Hba1c today is 5.7%. Continue metformin 500 mg twice daily.  2.  Hypertension, well-controlled on Avapro 75 mg a day, amlodipine 10 mg a day, and Lopressor one half of a 100 mg tab twice a day.  #3 chronic renal insufficiency stage IIIa. Most recent serum 6 days ago was stable at 1.5.  #4 hyperkalemia, chronic. Most recent monitor showed this stable at 5.6.  He is on Lokelma 10 g powder 3 days a week.  An After Visit Summary was printed and given to the  patient.  FOLLOW UP: Return in about 3 months (around 01/23/2023) for annual CPE (fasting). Next CPE 01/2023 Signed:  Santiago Bumpers, MD           10/24/2022

## 2022-10-25 ENCOUNTER — Other Ambulatory Visit (HOSPITAL_COMMUNITY): Payer: Self-pay

## 2022-10-27 DIAGNOSIS — R0602 Shortness of breath: Secondary | ICD-10-CM | POA: Diagnosis not present

## 2022-10-27 DIAGNOSIS — I082 Rheumatic disorders of both aortic and tricuspid valves: Secondary | ICD-10-CM | POA: Diagnosis not present

## 2022-10-27 DIAGNOSIS — I350 Nonrheumatic aortic (valve) stenosis: Secondary | ICD-10-CM | POA: Diagnosis not present

## 2022-10-27 DIAGNOSIS — J849 Interstitial pulmonary disease, unspecified: Secondary | ICD-10-CM | POA: Diagnosis not present

## 2022-10-27 DIAGNOSIS — I77810 Thoracic aortic ectasia: Secondary | ICD-10-CM | POA: Diagnosis not present

## 2022-10-27 DIAGNOSIS — Z87891 Personal history of nicotine dependence: Secondary | ICD-10-CM | POA: Diagnosis not present

## 2022-10-28 ENCOUNTER — Other Ambulatory Visit (HOSPITAL_COMMUNITY): Payer: Self-pay

## 2022-10-29 ENCOUNTER — Encounter (HOSPITAL_COMMUNITY): Payer: Self-pay

## 2022-10-31 DIAGNOSIS — Z944 Liver transplant status: Secondary | ICD-10-CM | POA: Diagnosis not present

## 2022-10-31 DIAGNOSIS — D849 Immunodeficiency, unspecified: Secondary | ICD-10-CM | POA: Diagnosis not present

## 2022-10-31 LAB — COMPREHENSIVE METABOLIC PANEL: eGFR: 51

## 2022-10-31 LAB — BASIC METABOLIC PANEL
BUN: 13 (ref 4–21)
CO2: 24 — AB (ref 13–22)
Chloride: 101 (ref 99–108)
Creatinine: 1.4 — AB (ref 0.6–1.3)
Glucose: 108
Potassium: 5.1 meq/L (ref 3.5–5.1)
Sodium: 138 (ref 137–147)

## 2022-11-01 ENCOUNTER — Other Ambulatory Visit: Payer: Self-pay

## 2022-11-03 ENCOUNTER — Other Ambulatory Visit: Payer: Self-pay

## 2022-11-07 ENCOUNTER — Other Ambulatory Visit: Payer: Self-pay

## 2022-11-07 ENCOUNTER — Encounter (HOSPITAL_COMMUNITY): Payer: Self-pay

## 2022-11-07 ENCOUNTER — Encounter: Payer: Self-pay | Admitting: Family Medicine

## 2022-11-07 NOTE — Progress Notes (Signed)
Specialty Pharmacy Refill Coordination Note  William [name] is a 72 y.o. male contacted today regarding refills of specialty medication(s) Tacrolimus (Immunosuppressive Agents) .  Patient requested [name] at [facility] at [place]  on 11/21/22   Medication will be filled on 11/18/2022.

## 2022-11-07 NOTE — Progress Notes (Signed)
Specialty Pharmacy Ongoing Clinical Assessment Note  William [name] is a 72 y.o. male who is being followed by the specialty pharmacy service for RxSp Transplant   Review of patient's specialty medication(s) Mycophenolate Mofetil (Immunosuppressive Agents); Tacrolimus (Immunosuppressive Agents)  occurred today.   Patient has missed 0  doses in the last 4 weeks.   Patient did not have any additional questions or concerns.   Therapeutic benefit summary: Patient is achieving benefit   Adverse events/side effects summary: No adverse events/side effects   Patient's therapy is appropriate to : Continue   Goals       Slow Disease Progression    Patient is on track. Patient will maintain adherence            Follow up:  6 months

## 2022-11-14 ENCOUNTER — Other Ambulatory Visit (HOSPITAL_COMMUNITY): Payer: Self-pay

## 2022-11-15 ENCOUNTER — Other Ambulatory Visit (HOSPITAL_COMMUNITY): Payer: Self-pay

## 2022-11-15 MED ORDER — LOKELMA 10 G PO PACK
PACK | Freq: Every day | ORAL | 6 refills | Status: DC
  Filled 2022-11-15: qty 15, 30d supply, fill #0
  Filled 2023-01-10: qty 15, 30d supply, fill #1

## 2022-11-16 ENCOUNTER — Other Ambulatory Visit (HOSPITAL_COMMUNITY): Payer: Self-pay

## 2022-11-17 ENCOUNTER — Other Ambulatory Visit (HOSPITAL_COMMUNITY): Payer: Self-pay

## 2022-11-19 ENCOUNTER — Other Ambulatory Visit (HOSPITAL_COMMUNITY): Payer: Self-pay

## 2022-11-21 ENCOUNTER — Other Ambulatory Visit: Payer: Self-pay

## 2022-11-21 ENCOUNTER — Other Ambulatory Visit (HOSPITAL_COMMUNITY): Payer: Self-pay

## 2022-11-21 DIAGNOSIS — Z944 Liver transplant status: Secondary | ICD-10-CM | POA: Diagnosis not present

## 2022-11-21 DIAGNOSIS — D849 Immunodeficiency, unspecified: Secondary | ICD-10-CM | POA: Diagnosis not present

## 2022-11-21 MED ORDER — PANTOPRAZOLE SODIUM 40 MG PO TBEC
40.0000 mg | DELAYED_RELEASE_TABLET | Freq: Every day | ORAL | 3 refills | Status: DC
  Filled 2022-11-21 – 2022-12-26 (×2): qty 90, 90d supply, fill #0
  Filled 2023-03-28: qty 90, 90d supply, fill #1
  Filled 2023-06-30: qty 90, 90d supply, fill #2
  Filled 2023-10-11: qty 90, 90d supply, fill #3

## 2022-11-23 ENCOUNTER — Other Ambulatory Visit (HOSPITAL_COMMUNITY): Payer: Self-pay

## 2022-11-24 ENCOUNTER — Other Ambulatory Visit: Payer: Self-pay

## 2022-11-27 ENCOUNTER — Other Ambulatory Visit: Payer: Self-pay | Admitting: Family Medicine

## 2022-11-27 ENCOUNTER — Other Ambulatory Visit (HOSPITAL_COMMUNITY): Payer: Self-pay

## 2022-11-28 ENCOUNTER — Other Ambulatory Visit (HOSPITAL_COMMUNITY): Payer: Self-pay

## 2022-11-28 MED ORDER — METFORMIN HCL 500 MG PO TABS
500.0000 mg | ORAL_TABLET | Freq: Two times a day (BID) | ORAL | 0 refills | Status: DC
  Filled 2022-11-28: qty 60, 30d supply, fill #0

## 2022-11-28 MED ORDER — TRIAMCINOLONE ACETONIDE 0.1 % EX CREA
TOPICAL_CREAM | CUTANEOUS | 2 refills | Status: DC
  Filled 2022-11-28: qty 80, 14d supply, fill #0
  Filled 2022-12-21: qty 80, 14d supply, fill #1

## 2022-11-29 ENCOUNTER — Other Ambulatory Visit (HOSPITAL_COMMUNITY): Payer: Self-pay

## 2022-12-02 ENCOUNTER — Ambulatory Visit (INDEPENDENT_AMBULATORY_CARE_PROVIDER_SITE_OTHER): Payer: Medicare Other

## 2022-12-02 DIAGNOSIS — Z23 Encounter for immunization: Secondary | ICD-10-CM | POA: Diagnosis not present

## 2022-12-08 ENCOUNTER — Other Ambulatory Visit: Payer: Self-pay

## 2022-12-12 ENCOUNTER — Other Ambulatory Visit (HOSPITAL_COMMUNITY): Payer: Self-pay | Admitting: Pharmacy Technician

## 2022-12-12 ENCOUNTER — Other Ambulatory Visit (HOSPITAL_COMMUNITY): Payer: Self-pay

## 2022-12-12 ENCOUNTER — Other Ambulatory Visit: Payer: Self-pay

## 2022-12-12 NOTE — Progress Notes (Signed)
Specialty Pharmacy Refill Coordination Note  William [name] is a 72 y.o. male contacted today regarding refills of specialty medication(s) Mycophenolate Mofetil (Immunosuppressive Agents)  Spoke with wife.  Patient requested Pickup at [facility] at [place] date: 12/14/22   Medication will be filled on 11/05.

## 2022-12-17 ENCOUNTER — Other Ambulatory Visit (HOSPITAL_COMMUNITY): Payer: Self-pay

## 2022-12-19 ENCOUNTER — Other Ambulatory Visit: Payer: Self-pay | Admitting: Family Medicine

## 2022-12-19 ENCOUNTER — Other Ambulatory Visit (HOSPITAL_COMMUNITY): Payer: Self-pay

## 2022-12-19 ENCOUNTER — Other Ambulatory Visit: Payer: Self-pay

## 2022-12-19 MED ORDER — METFORMIN HCL 500 MG PO TABS
500.0000 mg | ORAL_TABLET | Freq: Two times a day (BID) | ORAL | 0 refills | Status: DC
  Filled 2022-12-19 – 2022-12-26 (×2): qty 180, 90d supply, fill #0

## 2022-12-26 ENCOUNTER — Other Ambulatory Visit (HOSPITAL_COMMUNITY): Payer: Self-pay

## 2022-12-26 ENCOUNTER — Other Ambulatory Visit: Payer: Self-pay

## 2022-12-28 ENCOUNTER — Other Ambulatory Visit (HOSPITAL_COMMUNITY): Payer: Self-pay

## 2022-12-31 ENCOUNTER — Other Ambulatory Visit (HOSPITAL_COMMUNITY): Payer: Self-pay

## 2023-01-10 ENCOUNTER — Other Ambulatory Visit: Payer: Self-pay

## 2023-01-10 ENCOUNTER — Other Ambulatory Visit (HOSPITAL_COMMUNITY): Payer: Self-pay

## 2023-01-17 DIAGNOSIS — C44622 Squamous cell carcinoma of skin of right upper limb, including shoulder: Secondary | ICD-10-CM | POA: Diagnosis not present

## 2023-01-17 DIAGNOSIS — C44629 Squamous cell carcinoma of skin of left upper limb, including shoulder: Secondary | ICD-10-CM | POA: Diagnosis not present

## 2023-01-18 ENCOUNTER — Encounter: Payer: Self-pay | Admitting: Cardiovascular Disease

## 2023-01-18 ENCOUNTER — Ambulatory Visit: Payer: Medicare Other | Attending: Cardiovascular Disease | Admitting: Cardiovascular Disease

## 2023-01-18 DIAGNOSIS — R600 Localized edema: Secondary | ICD-10-CM | POA: Diagnosis not present

## 2023-01-18 DIAGNOSIS — N1831 Chronic kidney disease, stage 3a: Secondary | ICD-10-CM | POA: Diagnosis not present

## 2023-01-18 DIAGNOSIS — Z951 Presence of aortocoronary bypass graft: Secondary | ICD-10-CM | POA: Diagnosis not present

## 2023-01-18 DIAGNOSIS — I10 Essential (primary) hypertension: Secondary | ICD-10-CM

## 2023-01-18 DIAGNOSIS — I350 Nonrheumatic aortic (valve) stenosis: Secondary | ICD-10-CM

## 2023-01-18 DIAGNOSIS — Z944 Liver transplant status: Secondary | ICD-10-CM

## 2023-01-18 DIAGNOSIS — I5032 Chronic diastolic (congestive) heart failure: Secondary | ICD-10-CM

## 2023-01-18 DIAGNOSIS — Z8719 Personal history of other diseases of the digestive system: Secondary | ICD-10-CM | POA: Diagnosis not present

## 2023-01-18 DIAGNOSIS — I2510 Atherosclerotic heart disease of native coronary artery without angina pectoris: Secondary | ICD-10-CM

## 2023-01-18 DIAGNOSIS — Z79899 Other long term (current) drug therapy: Secondary | ICD-10-CM | POA: Diagnosis not present

## 2023-01-18 DIAGNOSIS — E785 Hyperlipidemia, unspecified: Secondary | ICD-10-CM

## 2023-01-18 DIAGNOSIS — E875 Hyperkalemia: Secondary | ICD-10-CM

## 2023-01-18 DIAGNOSIS — I714 Abdominal aortic aneurysm, without rupture, unspecified: Secondary | ICD-10-CM

## 2023-01-18 NOTE — Progress Notes (Signed)
Patient ID: William [name], male   DOB: [birth year], 72 y.o.   MRN: [id number]       HPI: William [name] is a 72 y.o. male who presents to the office today for a 85 month cardiology evaluation.  William [name] has known CAD and underwent CABG revascularization surgery with a LIMA to the LAD, SVG to the RCA, SVG to the circumflex vessel in February 2008.  He has a history of obesity, metabolic syndrome, and mixed hyperlipidemia with preponderance of small dense LDL some particles and increased TG for which he has been on combination therapy.  In December 2013 a nuclear perfusion study was unchanged from 2 years previously and did not show significant scar or ischemia. In January 2014,  I adjusted his antihypertensive medications and started  Benicar HCT 40/25 for optimal blood pressure control with increase diuretic regimen and took him off his Avalide. He has felt improved on this therapy.  According to his wife, his blood pressure at home typically is in the 120s systolially and in the 70s diastolically.  He has a history of moderate obesity.  He does not routinely exercise.  He also does note some dietary discretion. He has noticed some mild leg swelling intermittently. He denies recent anginal symptoms. He denies palpitations.   In 2016 he underwent a urologic evaluation for gross hematuria. A CT of his abdomen and pelvis did not reveal any evidence for renal calculi or hydronephrosis or evidence for renal calculi or dilatation. He did have small benign renal cysts. He was also noted a 3.5 cm infrarenal abdominal aortic aneurysm. There was also a suggestion of fatty infiltration of his liver.  He underwent a follow-up abdominal aortic ultrasound on 06/27/2014.  This showed his distal aorta measuring 3.0 x 3.1 cm and he had mild amount of atherosclerosis.  He underwent a  colonoscopy by Dr. Ranae Palms and was found to have 2 benign polyps.  He has been off  Crestor and niacin since last year due to  elevation of his liver enzymes. There is family history for liver disease in that his mother died from liver disease and his brother had a liver transplant.  In July 2016, his AST was 51 and ALT was 54.  Subsequent blood work by Dr. Loreta Ave revealed his AST increased at 87 and ALT 68.  On 01/15/2015, his AST was 85 and ALT 64.  At that time, total cholesterol was 244, triglycerides 149, HDL 30, and his LDL cholesterol on statin therapy had risen to 184.   When I saw him in December 2017 he denied chest tightness.  He was not successful with weight loss.  Labs checked by his primary M.D.showed a hemoglobin A1c was 6.6.  LFTs had slightly improved but AST was still elevated at 71 and his ALT was now normal.   He underwent a nuclear perfusion study in March 2018.  This showed normal perfusion.  Ejection fraction was 66% with normal wall motion.  The test was felt to be low risk.  He underwent an echo Doppler study in April 2012 which showed an EF of 60-65% with mild LVH.  There was grade 2 diastolic dysfunction and increased filling pressures.  There was mild aortic stenosis with a mean gradient of 23 and peak gradient of 36, mild aortic root dilatation, mild biatrial enlargement, and mild TR.  He has had recurrent admissions for anemia and GI blood loss.  He has had undergone evaluations by Dr. Loreta Ave, and Ailene Ravel  and was planning to see Dr. Theodore Demark  at [facility] for further GI evaluation and possible procedures.  He has been intolerant to statin therapy secondary to LFT elevation.    He went to [facility] and had small bowel evaluation.  He has continued to have recurrent GI bleeds.  He has required packed red blood cell transfusions.  2 weeks ago.  His hemoglobin was 9 and hematocrit 28.9, and yesterday hemoglobin was 7.5 with hematocrit of 24.1.  He is scheduled to have another red blood cell transfusion on 01/14/2017.  He has also been undergoing IV iron therapy with his hematologist, Dr. Candise Che.  He denies any episodes of chest  pain.  His liver transaminases have been normal, but his bilirubin has increased in the 4 range.   He was diagnosed with cirrhosis.  When I saw him in August 2019 he was getting intravenous iron is no longer on octreotide due to insurance.  He needs to be followed by Dr. Arlie Solomons for primary care.  Denies episodes of chest tightness.  He denies presyncope or syncope.  In the past he was on Repatha but this was stopped secondary to an increased ammonia level and there was concern that this may have been contributory.  He admitted to mild fatigue and shortness of breath.  He underwent a follow-up echo Doppler study in September 2019.  He had mild LVH but on the echo there was no mention made of LV function.  There was moderate aortic valve stenosis with a mean gradient of 19 and peak gradient of 41.  Aortic valve area is 1.1 cm.  He had mild dilatation of his a sending aorta.  I last saw him in December 2019 at which time he denied any chest pain, PND or orthopnea.  He was unaware of any tachypalpitations. He was being followed by the hematology oncology team and was last seen on January 19, 2018.  Ferritin level was 123.  Iron saturation 18% which had improved from 8%.  He had mild renal insufficiency with a creatinine of 1.3.  Hemoglobin was 11.5 with hematocrit of 35.5 which was improved.   He developed pneumonia in February 2020 and bilirubin further increased to 9-10 range.  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  In August 2020 he underwent EVAR for abdominal aortic aneurysm at [facility].  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  He is being followed in their liver transplant clinic and has continued to be on prednisone and tacrolimus for immunosuppression following his transplant.  I last saw him on December 30, 2019 at which time he denied any chest pain or shortness of breath.  His blood pressure at home was typically running in the 140 to 150  range.  His creatinine had increased to 4 following his liver transplant but subsequently improved to 1.31.    Since I saw him, he has been evaluated by Edd Fabian, NP in August 2022 and most recently in October 2022.  He underwent an echo Doppler study on October 21, 2020 which showed EF 60 to 65% with normal diastolic parameters.  There was moderate aortic valve calcification with a mean gradient of 14 and peak gradient of 24.  Estimated aortic valve area was 1.7 cm consistent with mild aortic stenosis.  There was trivial aortic insufficiency.  I last saw William [name] on September 01, 2021.  He felt well.  He is followed by Dr. Julaine Hua for primary care.  He admits to leg swelling particularly in the evening.  He sees Dr. Lia Foyer in follow-up of his liver transplant at [facility] for his nonalcoholic liver cirrhosis.  He underwent laboratory 3 weeks ago and most recent creatinine was 1.6.  Potassium was 5.1.  AST and ALT were normal at 13 and 14, respectively.  He was not anemic with hemoglobin 14.9 hematocrit 46.6.  White count was 10.2.  During that evaluation, I recommended addition of low-dose rosuvastatin at 10 mg.  Since I saw him, he was evaluated by Edd Fabian, NP on March 31, 2022.  His blood pressure was well-controlled.  Presently, William [name]'s medications had been adjusted since I last saw him and his irbesartan was reduced to 75 mg.  Due to continued hyperkalemia he has been on low-dose Lokelma half a packet once daily.  He continues to be on amlodipine 10 mg, metoprolol tartrate 100 mg twice a day.  He is on tacrolimus with his liver transplant.  He continues to be followed at [facility].  He tells me he has problems taking Lokelma and often times it increases his shortness of breath.  Also at times his blood pressure has been low in the 100/60 range.  He will be seeing Dr. Marvel Plan next week and complete laboratory will be obtained.  He underwent a recent echo Doppler study at [facility] on  October 27, 2022 which reported normal LV systolic function with mild LVH.  He had normal right ventricular systolic function.  There was moderate aortic stenosis, mild AR, trivial MR, mild TR, and trivial PR.  His mean aortic gradient was 25 mm with peak gradient 44 mm.  He denies any chest pain, palpitations, presyncope or syncope.  He presents for reevaluation.   Past Medical History:  Diagnosis Date   AAA (abdominal aortic aneurysm) (HCC) 03/2014   repaired 2020   Ascending aortic aneurysm (HCC)    unrepaired. Stable at 4.3 cm on imaging by [facility] thoracic surg 10/2019 and 10/2020.   Bilateral renal cysts    simple (03/2014 MRI)   CAD (coronary artery disease)    Cholelithiases 2018   asymptomatic   Chronic diastolic heart failure (HCC)    Chronic renal insufficiency, stage 3 (moderate) (HCC) 2022   baseline sCr 1.3-1.5 (GFR 55)   COVID-19 virus infection 02/2020   sotrovimab infusion   Diabetes mellitus with complication (HCC) 07/2014   A1c 6.8%. A1c 5.4% 11/2019   Diverticulosis    Gout    initially dx'd by diag arthrocentesis of elbow effusion in winter 2021, 3 episodes, all came after being started on chlorthalidone->I d/c'd chlorthal.   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  Improving + off oxygen as of 3 mo s/p liver transplant.  Doing well/resolved as of 02/2020 [facility] pulm f/u->they'll continue to follow.   History of blood transfusion 2018 X 4 dates   Chronic GI blood loss of unknown location despite full GI endoscopic eval, etc   History of liver transplant (HCC) 07/2019   [facility]   Hyperkalemia    Lokelma   Hyperlipemia, mixed    elevated LFTs when on statins.     Hypertension    Cr bump 04/01/16 so I changed benicar-hct to benicar plain and added amlodipine 5 mg.   Increased prostate specific antigen (PSA) velocity 2021   0.11 to 4.77 from 2020 to 2021 (he got a liver transplant and was put on antirejection meds between these psa checks).   Iron deficiency anemia  05/2016  Acute blood loss anemia: hospitalized, required transfusion x 3 U: colonoscopy and capsule study unrevealing.  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  W/u for ? hemolytic anemia to be pursued by hematologist as outpt.  Dr. Theodore Demark, GI at [facility] following, too---he rec'd onc do bone marrow bx as of Jan 2019   Iron deficiency anemia due to chronic blood loss 2018/19   GI: transfusions x >20 required; multiple endoscopies and bleeding scans unrevealing. Lysteda and octreotide + monthly iron infusions as of 04/2017. Iron infusions changed to every other month as of 09/2018 hem f/u.   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  Changes noted on all CXRs in 2018/2019.  Liver Transplant eval 03/2018->mild restriction on PFTs but no obstruction.  See further details in PMH "pulm fibrosis" section   Microscopic hematuria    Eval unremarkable by Dr. Hillis Range.   Nonmelanoma skin cancer 08/15/2018   2020 BCC nose, excised.  2022 R side of neck->mohs   Obesity    Pulmonary fibrosis (HCC)    PFTs: restrictive lung dz ([facility] Liver transplant eval 05/2018). [facility] pulm eval felt this was likely hepatopulmonary syndrome->causing his hypoxia->low likelihood of progression (as of 10/04/18 transplant clinic f/u). Pulm rehab helping as of 2020/2021. OFF oxygen as of 10/2019 transp f/u.   Spleen enlarged     Past Surgical History:  Procedure Laterality Date   ABDOMINAL AORTIC ANEURYSM REPAIR  08/23/2018   [facility]   CARDIAC CATHETERIZATION     CARDIOVASCULAR STRESS TEST  01/17/2012; 05/04/16   Normal stress nuclear study x 2 (2018--Normal perfusion. LVEF 66% with normal wall  motion. This is a low risk study).   CERVICAL DISCECTOMY  1992   COLONOSCOPY N/A 05/27/2016   No site/explanation for blood loss found.  Erythematous mucosa in cecum and ascending colon--Cecal bx: normal.  Procedure: COLONOSCOPY;  Surgeon: Jeani Hawking, MD;  Location: [facility] ENDOSCOPY;  Service: Endoscopy;  Laterality: N/A;   COLONOSCOPY     01/07/22 diverticulosis but NO POLYPS.  No further recommended   COLONOSCOPY W/ POLYPECTOMY  09/10/2014   Polypectomy x 2: recall 5 yrs (Dr. Loreta Ave).   COLONOSCOPY WITH PROPOFOL N/A 01/07/2022   NO FURTHER SCREENING INDICATED.  Procedure: COLONOSCOPY WITH PROPOFOL;  Surgeon: Jeani Hawking, MD;  Location: [facility] ENDOSCOPY;  Service: Gastroenterology;  Laterality: N/A;   CORONARY ANGIOPLASTY     CORONARY ARTERY BYPASS GRAFT  03/2006   CT ABDOMEN WITH CONTRAST ([facility] HX)  11/2018   CT CHEST WWO CONTAST ([facility] HX)  07/11/2019   No significant change in interstitial lung disease with possible underlying hepatopulmonary syndrome, Increased ascites in the setting of cirrhosis, tiny new right basilar nodule   DEXA  03/07/2019   T score 0.2/NORMAL   ENTEROSCOPY N/A 07/31/2016   Procedure: ENTEROSCOPY;  Surgeon: Meryl Dare, MD;  Location: [facility] ENDOSCOPY;  Service: Endoscopy;  Laterality: N/A;   ENTEROSCOPY N/A 12/02/2016   Procedure: ENTEROSCOPY;  Surgeon: Jeani Hawking, MD;  Location: [facility] ENDOSCOPY;  Service: Endoscopy;  Laterality: N/A;   ESOPHAGOGASTRODUODENOSCOPY N/A 05/25/2016   No site/explanation for blood loss found.  Procedure: ESOPHAGOGASTRODUODENOSCOPY (EGD);  Surgeon: Charna Elizabeth, MD;  Location: [facility] ENDOSCOPY;  Service: Endoscopy;  Laterality: N/A;   ESOPHAGOGASTRODUODENOSCOPY N/A 09/23/2016   Procedure: ESOPHAGOGASTRODUODENOSCOPY (EGD);  Surgeon: Jeani Hawking, MD;  Location: [facility] ENDOSCOPY;  Service: Endoscopy;  Laterality: N/A;   ESOPHAGOGASTRODUODENOSCOPY (EGD) WITH PROPOFOL N/A 08/21/2018   Procedure: ESOPHAGOGASTRODUODENOSCOPY (EGD) WITH PROPOFOL;  Surgeon:  Jeani Hawking, MD;  Location: [facility] ENDOSCOPY;  Service: Endoscopy;  Laterality: N/A;   GIVENS CAPSULE STUDY N/A 05/27/2016   No source identified.  Repeat 10/2016--results pending.  Procedure: GIVENS CAPSULE STUDY;  Surgeon: Jeani Hawking, MD;  Location: [facility] ENDOSCOPY;  Service: Endoscopy;  Laterality: N/A;   GIVENS CAPSULE STUDY N/A 10/15/2016   Procedure: GIVENS CAPSULE STUDY;  Surgeon: Jeani Hawking, MD;  Location: [facility] ENDOSCOPY;  Service: Endoscopy;  Laterality: N/A;   GIVENS CAPSULE STUDY N/A 02/13/2017   Procedure: GIVENS CAPSULE STUDY;  Surgeon: Jeani Hawking, MD;  Location: [facility] ENDOSCOPY;  Service: Endoscopy;  Laterality: N/A;   LIVER TRANSPLANT  08/02/2019   For NASH cirrhosis. [facility]   NECK SURGERY  1991   NM GI BLOOD LOSS  01/27/2017   NEGATIVE   SMALL BOWEL ENTEROSCOPY  10/2016   ([facility], Dr. Theodore Demark: Double balloon enteroscopy--to the level of proximal ileum) jejunal polyps- which were removed--not bleeding & benign.  No source of bleeding was identified.   TRANSTHORACIC ECHOCARDIOGRAM  05/25/2016; 10/2017   05/2016: EF 60%, normal wall motion, grd II DD, mild aortic stenosis, dilated aortic root and ascending aorta. 10/2017: Mild LVH; no mention made of LV function.  Moderate aorticstenosis with mean gradient 19 peak gradient 41. AVA 1.1 cm^2.  05/2018 EF 55%,4.3 cm ascend aneur, mild AS. 10/2020 EF 60-65%, normal diast fxn, mod AV sclerosis w/out sten/regurg.   VASECTOMY  1977    Allergies  Allergen Reactions   Niacinamide Itching    Improved upon discontinuation    Thiazide-Type Diuretics Other (See Comments)    Chlorthalidone caused gout   Quinolones Other (See Comments)    CONTRAINDICATED D/T PT HAVING AAA    Current Outpatient Medications  Medication Sig Dispense Refill   amLODipine (NORVASC) 10 MG tablet Take 1 tablet (10 mg total) by mouth daily. 90 tablet 3   ammonium lactate (AMLACTIN) 12 % cream Apply 2 times daily to forearms and hands in preparation for treatment with Efudex  (fluorouracil) 385 g 99   aspirin EC 81 MG tablet Take 81 mg by mouth daily. Swallow whole.     furosemide (LASIX) 20 MG tablet Take 1 tablet (20 mg total) by mouth daily. 90 tablet 3   Magnesium 400 MG TABS Take 400 mg by mouth daily.     metFORMIN (GLUCOPHAGE) 500 MG tablet Take 1 tablet (500 mg total) by mouth 2 (two) times daily with a meal. 180 tablet 0   metoprolol tartrate (LOPRESSOR) 100 MG tablet Take 1 tablet (100 mg total) by mouth 2 (two) times daily. (Patient taking differently: Take 100 mg by mouth 2 (two) times daily. 1/2 TAB) 180 tablet 3   mycophenolate (CELLCEPT) 250 MG capsule Take 2 capsules by mouth in the morning and 2 capsules in the evening 360 capsule 3   pantoprazole (PROTONIX) 40 MG tablet Take 1 tablet (40 mg total) by mouth daily. 90 tablet 3   rosuvastatin (CRESTOR) 10 MG tablet Take 1 tablet (10 mg total) by mouth daily. 90 tablet 3   tacrolimus (PROGRAF) 1 MG capsule Take 2 capsules (2 mg total) by mouth every morning AND 1 capsule (1 mg total) every evening. 270 capsule 3   triamcinolone cream (KENALOG)  0.1 % Apply 2 times a day to rash. Follow with CeraVe Cream.  Stop when smooth. 80 g 2   No current facility-administered medications for this visit.   Facility-Administered Medications Ordered in Other Visits  Medication Dose Route Frequency Provider Last Rate Last Admin   heparin lock flush 100 unit/mL  500 Units Intracatheter Daily PRN Malachy Mood, MD       sodium chloride flush (NS) 0.9 % injection 10 mL  10 mL Intracatheter PRN Malachy Mood, MD        Socially he's married has 3 children 3 grandchildren. He does not routinely exercise. In the past he had been walking 4 miles on a treadmill but has not done this in some time. There is no tobacco or alcohol use.  ROS General: Negative; No fevers, chills, or night sweats;  HEENT: Negative; No changes in vision or hearing, sinus congestion, difficulty swallowing Pulmonary: Negative; No cough, wheezing, shortness  of breath, hemoptysis Cardiovascular: Negative; No chest pain, presyncope, syncope, palpitations Positive for Increasing leg swelling GI: Positive for polyps on colonoscopy; positive for fatty liver; recent GI bleeding and symptomatic anemia, h/o cirrhosis; status post liver transplant GU: Negative; No dysuria, hematuria, or difficulty voiding Musculoskeletal: Negative; no myalgias, joint pain, or weakness Hematologic/Oncology: Negative; no easy bruising, bleeding Endocrine: Positive for metabolic syndrome/mild diabetes; no heat/cold intolerance; Neuro: Negative; no changes in balance, headaches Skin: Negative; No rashes or skin lesions Psychiatric: Negative; No behavioral problems, depression Sleep: Negative; No snoring, daytime sleepiness, hypersomnolence, bruxism, restless legs, hypnogognic hallucinations, no cataplexy Other comprehensive 14 point system review is negative.   PE BP 124/82   Pulse 73   Ht 5\' 9"  (1.753 m)   Wt 232 lb (105.2 kg)   SpO2 97%   BMI 34.26 kg/m    Repeat blood pressure by me was 122/76  Wt Readings from Last 3 Encounters:  01/18/23 232 lb (105.2 kg)  10/24/22 236 lb 6.4 oz (107.2 kg)  09/20/22 223 lb 14.4 oz (101.6 kg)   General: Alert, oriented, no distress.  Skin: normal turgor, no rashes, warm and dry HEENT: Normocephalic, atraumatic. Pupils equal round and reactive to light; sclera anicteric; extraocular muscles intact;  Nose without nasal septal hypertrophy Mouth/Parynx benign; Mallinpatti scale 3 Neck: No JVD, no carotid bruits; normal carotid upstroke Lungs: clear to ausculatation and percussion; no wheezing or rales Chest wall: without tenderness to palpitation Heart: PMI not displaced, RRR, s1 s2 normal, 2-3/6 mid peaking systolic murmur consistent with aortic stenosis, no diastolic murmur, no rubs, gallops, thrills, or heaves Abdomen: Large abdominal scar from his liver transplantation;soft, nontender; no hepatosplenomehaly, BS+;  abdominal aorta nontender and not dilated by palpation. Back: no CVA tenderness Pulses 2+ Musculoskeletal: full range of motion, normal strength, no joint deformities Extremities: Trace ankle edema;no clubbing cyanosis or edema, Homan's sign negative  Neurologic: grossly nonfocal; Cranial nerves grossly wnl Psychologic: Normal mood and affect    EKG Interpretation Date/Time:  Wednesday January 18 2023 11:23:23 EST Ventricular Rate:  71 PR Interval:  236 QRS Duration:  126 QT Interval:  394 QTC Calculation: 428 R Axis:   -51  Text Interpretation: Sinus rhythm with 1st degree A-V block with Premature supraventricular complexes Left axis deviation Left bundle branch block When compared with ECG of 26-Mar-2018 16:37, PREVIOUS ECG IS PRESENT Confirmed by Nicki Guadalajara ([id number]) on 01/18/2023 12:04:02 PM    September 01, 2021 ECG (independently read by me): Sinus rhythm at 77, 1st degree AV block; PR 218 msec  December 30, 2019 ECG (independently read by me): Normal sinus rhythm at 75 bpm, incomplete right bundle branch block, QS complex V1 V2.  PR interval 204 ms, QTc interval 437 ms  December 2019 ECG (independently read by me): Normal sinus rhythm at 71 bpm.  First-degree AV block.  LVH by voltage.  QRS complex V1 V2.  August 2019 ECG (independently read by me): NSR at 71; IRBBB, QS complex V1-08 January 2017 ECG (independently read by me): Normal sinus rhythm at 79 bpm.  Incomplete right bundle branch block.  LVH by voltage.  Nonspecific ST changes.  QTc interval 509 ms.  August 2018 ECG (independently read by me): Normal sinus rhythm at 74 bpm.  Incomplete right bundle branch block.  First-degree AV block with a PR interval 28 ms.  QRS complex V1 V2.  QTc interval 499 ms.  March 2018 ECG (independently read by me): Normal sinus rhythm at 61 bpm.  First degree AV block with a PR interval at 224 ms.  Incomplete right bundle branch block.  December 2017 ECG (independently read by me): Normal  sinus rhythm at 63 bpm with first-degree AV block.  PR interval 222 ms.  Incomplete right bundle branch block.  June 2017 ECG (independently read by me): Normal sinus rhythm at 65 bpm with first-degree AV block with a PR interval of 214 ms.  Mild RV conduction delay.  Poor anterior R-wave progression V1 through V4.  December 2016 ECG (independently read by me): Normal sinus rhythm at 66 bpm.  First-degree AV block with a PR interval at 224 ms.  June 2016 ECG (independently read by me): Normal sinus rhythm at 66. First-degree block with PR interval 28 ms.  Incomplete right bundle branch block.  QRS complex V1-3.   October 2015 ECG (independently read by me): Sinus rhythm with first-degree AV block.  Incomplete right bundle branch block.  QTc interval 447 ms; first-degree AV block with a PR interval of 220 ms  03/19/2013 ECG (independently read by me): Normal sinus rhythm at 69 beats per minute. No ectopy. Borderline first-degree block with a PR interval of 216 ms.  Prior ECG of July 18,2014:  Sinus rhythm at 57 beats per minute. Borderline first-degree block with a period of 1214 ms. No significant ST abnormalities.  LABS:    Latest Ref Rng & Units 10/31/2022   12:00 AM 10/18/2022   12:00 AM 09/02/2022   12:00 AM  BMP  BUN 4 - 21 13  15     11       Creatinine 0.6 - 1.3 1.4  1.5     1.3      Sodium 137 - 147 138  136     136      Potassium 3.5 - 5.1 mEq/L 5.1  5.6     5.3      Chloride 99 - 108 101  99     97      CO2 13 - 22 24  22     23       Calcium 8.7 - 10.7  9.7     9.8         This result is from an external source.      Latest Ref Rng & Units 10/18/2022   12:00 AM 09/02/2022   12:00 AM 07/21/2022   10:00 AM  Hepatic Function  Total Protein 6.0 - 8.3 g/dL   7.2   Albumin 3.5 - 5.0 4.4     4.6  4.6   AST 14 - 40 12     14     11    ALT 10 - 40 U/L 12     12     9    Alk Phosphatase 25 - 125 101     113     99   Total Bilirubin 0.2 - 1.2 mg/dL   1.1      This result is from  an external source.      Latest Ref Rng & Units 10/18/2022   12:00 AM 04/12/2022   12:00 AM 03/29/2022   12:00 AM  CBC  WBC  10.9     10.1     9.7      Hemoglobin 13.5 - 17.5 14.2     14.4     14.7      Hematocrit 41 - 53 46     44     46      Platelets 150 - 400 K/uL 378     388     372         This result is from an external source.   Lab Results  Component Value Date   MCV 84.3 02/25/2022   MCV 85.6 01/13/2022   MCV 84.2 07/14/2021   Lab Results  Component Value Date   TSH 1.54 01/13/2021   Lab Results  Component Value Date   HGBA1C 5.7 (A) 10/24/2022   HGBA1C 5.7 10/24/2022   HGBA1C 5.7 10/24/2022   HGBA1C 5.7 10/24/2022   Lipid Panel     Component Value Date/Time   CHOL 94 07/21/2022 1000   CHOL 118 10/29/2021 0807   CHOL 135 11/29/2013 0940   TRIG 136.0 07/21/2022 1000   TRIG 177 (H) 11/29/2013 0940   HDL 25.90 (L) 07/21/2022 1000   HDL 26 (L) 10/29/2021 0807   HDL 34 (L) 11/29/2013 0940   CHOLHDL 4 07/21/2022 1000   VLDL 27.2 07/21/2022 1000   LDLCALC 41 07/21/2022 1000   LDLCALC 65 10/29/2021 0807   LDLCALC 66 11/29/2013 0940   LDLDIRECT 162.0 01/13/2021 0933     RADIOLOGY: No results found.   I extensively reviewed the medical records over the past several years from [facility]  IMPRESSION:  1. Coronary artery disease involving native coronary artery of native heart without angina pectoris   2. 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 Essential hypertension   4. Moderate aortic stenosis   5. Abdominal aortic aneurysm (AAA) without rupture, unspecified part [facility]): Status post EVAR, August 2020   6. History of cirrhosis   7. Liver transplant recipient [facility]): August 02, 2019   8. Hyperlipidemia with target LDL less than 70   9. Serum potassium elevated   10. Bilateral lower extremity edema   11. Stage 3a chronic kidney disease (HCC)   12. Medication management     ASSESSMENT AND PLAN: William [name] is a 72  year-old gentleman with a history of CAD who underwent CABG revascularization surgery x4 in February 2008 . A nuclear perfusion study in December 2013 showed fairly normal perfusion without scar or ischemia. A five-year follow-up study in March 2018 continued to be low risk demonstrating normal perfusion and function.. He has significant mixed hyperlipidemia and in the past has been documented to have a preponderance of small dense LDL particles and significantly increased triglycerides. Remotely he initially tolerated combination therapy with Niaspan 2000 mg and Crestor 20 mg without  side effects.  However, he subsequently developed LFT elevation and statin and niacin were discontinued and LFTs normalized.  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  He subsequently developed nonalcoholic cirrhosis and underwent  successful liver transplantation on the fourth attempt on August 02, 2019.  He was on tacrolimus and prednisone for immunosuppression and is now on tacrolimus and CellCept.  He has been on a regimen of amlodipine 10 mg, irbesartan remotely 150 mg, now 75 mg, metoprolol tartrate 100 mg twice a day for hypertension.  He has been taking furosemide 20 mg daily for occasional ankle swelling and has taken an additional dose as needed.  He continues to be on tacrolimus and CellCept following his transplantation.  Recently, he has had issues with elevated potassium.  His irbesartan dose apparently had been decreased to 75 mg and he has been taking daily Lokelma.  He feels that the [facility] at times may contribute to some mild shortness of breath shortly after taking it.  He also states his blood pressure has been low.  Presently I have elected to discontinue irbesartan which may be contributing to his mild continued hyperkalemia and will therefore try to  discontinue Lokelma.  He will monitor his blood pressure closely and if blood pressure becomes elevated with his edema Lasix dose can be increased or possible low-dose hydralazine can be added to his regimen which should not adversely affect his potassium.  He continues to be on low-dose rosuvastatin 10 mg with normal LFTs.  Most recent LDL cholesterol was 41 on July 21, 2022.  He tells me he is already scheduled to undergo follow-up echo Doppler study at [facility] in February 2025.  I discussed with him my plans for retirement in 2025.  I have suggested he see Dr. Tonny Bollman for transition of cardiology care particularly with his at least moderate aortic valve stenosis which may progress with time. Keflex 5  Lennette Bihari, MD, [facility]  01/20/2023 10:07 AM

## 2023-01-18 NOTE — Patient Instructions (Signed)
Medication Instructions:  STOP sodium zirconium cyclosilicate (LOKELMA) 10 g PACK packet   STOP irbesartan (AVAPRO) 75 MG tablet  *If you need a refill on your cardiac medications before your next appointment, please call your pharmacy*   Lab Work: HAVE LABS DRAWN AT PCP's OFFICE If you have labs (blood work) drawn today and your tests are completely normal, you will receive your results only by: MyChart Message (if you have MyChart) OR A paper copy in the mail If you have any lab test that is abnormal or we need to change your treatment, we will call you to review the results.   Testing/Procedures: No procedures ordered today.    Follow-Up: At [facility], you and your health needs are our priority.  As part of our continuing mission to provide you with exceptional heart care, we have created designated Provider Care Teams.  These Care Teams include your primary Cardiologist (physician) and Advanced Practice Providers (APPs -  Physician Assistants and Nurse Practitioners) who all work together to provide you with the care you need, when you need it.  We recommend signing up for the patient portal called "MyChart".  Sign up information is provided on this After Visit Summary.  MyChart is used to connect with patients for Virtual Visits (Telemedicine).  Patients are able to view lab/test results, encounter notes, upcoming appointments, etc.  Non-urgent messages can be sent to your provider as well.   To learn more about what you can do with MyChart, go to [email or web address].    Your next appointment:   4 month(s)  Provider:   Tonny Bollman

## 2023-01-20 ENCOUNTER — Encounter: Payer: Self-pay | Admitting: Cardiovascular Disease

## 2023-01-26 ENCOUNTER — Ambulatory Visit (INDEPENDENT_AMBULATORY_CARE_PROVIDER_SITE_OTHER): Payer: Medicare Other | Admitting: Family Medicine

## 2023-01-26 ENCOUNTER — Encounter: Payer: Self-pay | Admitting: Family Medicine

## 2023-01-26 ENCOUNTER — Other Ambulatory Visit (HOSPITAL_COMMUNITY): Payer: Self-pay

## 2023-01-26 VITALS — BP 130/80 | HR 73 | Ht 69.0 in | Wt 228.8 lb

## 2023-01-26 DIAGNOSIS — Z125 Encounter for screening for malignant neoplasm of prostate: Secondary | ICD-10-CM

## 2023-01-26 DIAGNOSIS — E1121 Type 2 diabetes mellitus with diabetic nephropathy: Secondary | ICD-10-CM

## 2023-01-26 DIAGNOSIS — Z0000 Encounter for general adult medical examination without abnormal findings: Secondary | ICD-10-CM

## 2023-01-26 DIAGNOSIS — E7800 Pure hypercholesterolemia, unspecified: Secondary | ICD-10-CM

## 2023-01-26 DIAGNOSIS — E875 Hyperkalemia: Secondary | ICD-10-CM | POA: Diagnosis not present

## 2023-01-26 DIAGNOSIS — D849 Immunodeficiency, unspecified: Secondary | ICD-10-CM | POA: Diagnosis not present

## 2023-01-26 DIAGNOSIS — Z7984 Long term (current) use of oral hypoglycemic drugs: Secondary | ICD-10-CM | POA: Diagnosis not present

## 2023-01-26 DIAGNOSIS — I10 Essential (primary) hypertension: Secondary | ICD-10-CM | POA: Diagnosis not present

## 2023-01-26 DIAGNOSIS — N1831 Chronic kidney disease, stage 3a: Secondary | ICD-10-CM

## 2023-01-26 DIAGNOSIS — R0609 Other forms of dyspnea: Secondary | ICD-10-CM

## 2023-01-26 LAB — CBC WITH DIFFERENTIAL/PLATELET
Basophils Absolute: 0.1 10*3/uL (ref 0.0–0.1)
Basophils Relative: 0.9 % (ref 0.0–3.0)
Eosinophils Absolute: 0.8 10*3/uL — ABNORMAL HIGH (ref 0.0–0.7)
Eosinophils Relative: 6.7 % — ABNORMAL HIGH (ref 0.0–5.0)
HCT: 46 % (ref 39.0–52.0)
Hemoglobin: 14.8 g/dL (ref 13.0–17.0)
Lymphocytes Relative: 8.9 % — ABNORMAL LOW (ref 12.0–46.0)
Lymphs Abs: 1 10*3/uL (ref 0.7–4.0)
MCHC: 32.1 g/dL (ref 30.0–36.0)
MCV: 83.4 fL (ref 78.0–100.0)
Monocytes Absolute: 1.2 10*3/uL — ABNORMAL HIGH (ref 0.1–1.0)
Monocytes Relative: 10.2 % (ref 3.0–12.0)
Neutro Abs: 8.4 10*3/uL — ABNORMAL HIGH (ref 1.4–7.7)
Neutrophils Relative %: 73.3 % (ref 43.0–77.0)
Platelets: 376 10*3/uL (ref 150.0–400.0)
RBC: 5.51 Mil/uL (ref 4.22–5.81)
RDW: 17.5 % — ABNORMAL HIGH (ref 11.5–15.5)
WBC: 11.4 10*3/uL — ABNORMAL HIGH (ref 4.0–10.5)

## 2023-01-26 LAB — COMPREHENSIVE METABOLIC PANEL
ALT: 10 U/L (ref 0–53)
AST: 11 U/L (ref 0–37)
Albumin: 4.8 g/dL (ref 3.5–5.2)
Alkaline Phosphatase: 98 U/L (ref 39–117)
BUN: 18 mg/dL (ref 6–23)
CO2: 27 meq/L (ref 19–32)
Calcium: 10.1 mg/dL (ref 8.4–10.5)
Chloride: 97 meq/L (ref 96–112)
Creatinine, Ser: 1.39 mg/dL (ref 0.40–1.50)
GFR: 50.34 mL/min — ABNORMAL LOW (ref >60.00)
Glucose, Bld: 115 mg/dL — ABNORMAL HIGH (ref 70–99)
Potassium: 4.8 meq/L (ref 3.5–5.1)
Sodium: 134 meq/L — ABNORMAL LOW (ref 135–145)
Total Bilirubin: 1.3 mg/dL — ABNORMAL HIGH (ref 0.2–1.2)
Total Protein: 7.3 g/dL (ref 6.0–8.3)

## 2023-01-26 LAB — POCT GLYCOSYLATED HEMOGLOBIN (HGB A1C)
HbA1c POC (<> result, manual entry): 5.6 % (ref 4.0–5.6)
HbA1c, POC (controlled diabetic range): 5.6 % (ref 0.0–7.0)
HbA1c, POC (prediabetic range): 5.6 % — AB (ref 5.7–6.4)
Hemoglobin A1C: 5.6 % (ref 4.0–5.6)

## 2023-01-26 LAB — LIPID PANEL
Cholesterol: 90 mg/dL (ref 0–200)
HDL: 24.7 mg/dL — ABNORMAL LOW (ref >39.00)
LDL Cholesterol: 40 mg/dL (ref 0–99)
NonHDL: 65.35
Total CHOL/HDL Ratio: 4
Triglycerides: 128 mg/dL (ref 0.0–149.0)
VLDL: 25.6 mg/dL (ref 0.0–40.0)

## 2023-01-26 LAB — MICROALBUMIN / CREATININE URINE RATIO
Creatinine,U: 36.5 mg/dL
Microalb Creat Ratio: 4.9 mg/g (ref 0.0–30.0)
Microalb, Ur: 1.8 mg/dL (ref 0.0–1.9)

## 2023-01-26 LAB — PSA, MEDICARE: PSA: 1.39 ng/mL (ref 0.10–4.00)

## 2023-01-26 MED ORDER — AMLODIPINE BESYLATE 10 MG PO TABS
10.0000 mg | ORAL_TABLET | Freq: Every day | ORAL | 3 refills | Status: DC
  Filled 2023-01-26 – 2023-03-15 (×3): qty 90, 90d supply, fill #0
  Filled 2023-05-12 – 2023-06-15 (×2): qty 90, 90d supply, fill #1
  Filled 2023-09-27: qty 90, 90d supply, fill #2
  Filled 2024-01-01: qty 90, 90d supply, fill #3

## 2023-01-26 MED ORDER — METOPROLOL TARTRATE 100 MG PO TABS
100.0000 mg | ORAL_TABLET | Freq: Two times a day (BID) | ORAL | 1 refills | Status: DC
  Filled 2023-01-26 – 2023-03-28 (×2): qty 90, 45d supply, fill #0
  Filled 2023-05-24: qty 90, 45d supply, fill #1

## 2023-01-26 MED ORDER — FUROSEMIDE 20 MG PO TABS
20.0000 mg | ORAL_TABLET | Freq: Every day | ORAL | 3 refills | Status: DC
  Filled 2023-01-26: qty 90, 90d supply, fill #0
  Filled 2023-05-24: qty 90, 90d supply, fill #1
  Filled 2023-08-12: qty 90, 90d supply, fill #2
  Filled 2023-11-18: qty 90, 90d supply, fill #3

## 2023-01-26 NOTE — Progress Notes (Signed)
Office Note 01/26/2023  CC:  Chief Complaint  Patient presents with   Annual Exam    Pt is fasting.    Patient is a 72 y.o. male who is here for annual health maintenance exam and 43-month follow-up diabetes, hypertension, and chronic renal insufficiency stage III. A/P as of last visit: "#1 diabetes with nephropathy, good control. POC Hba1c today is 5.7%. Continue metformin 500 mg twice daily.   2.  Hypertension, well-controlled on Avapro 75 mg a day, amlodipine 10 mg a day, and Lopressor one half of a 100 mg tab twice a day.   #3 chronic renal insufficiency stage IIIa. Most recent serum 6 days ago was stable at 1.5.   #4 hyperkalemia, chronic. Most recent monitor showed this stable at 5.6.  He is on Lokelma 10 g powder 3 days a week."  INTERIM HX: States he is feeling pretty well but not as good as last time I saw him. About the last 3-4 months he has DOE and oxygen drops into low/mid 80s, doing things like going up steps. Symptoms resolved pretty quickly with rest. Dr. Tresa Endo d/c'd his lokelma (?fluid overload. ) and irbesartan (bp low). Brodan says his home blood pressures have been around 130/80 since this.  No signif LE swelling lately.   Reviewed labs from transplant clinic 12/22/22:  sCr 1.33, Gluc 116, BUN14, K 4.8, Na 135  ROS as above, plus--> no fevers, no CP, no wheezing, no cough, no dizziness, no HAs, no rashes, no melena/hematochezia.  No polyuria or polydipsia.  No myalgias or arthralgias.  No focal weakness, paresthesias, or tremors.  No acute vision or hearing abnormalities.  No dysuria or unusual/new urinary urgency or frequency. No n/v/d or abd pain.  No palpitations.    Past Medical History:  Diagnosis Date   AAA (abdominal aortic aneurysm) (HCC) 03/2014   repaired 2020   Ascending aortic aneurysm (HCC)    unrepaired. Stable at 4.3 cm on imaging by [facility] thoracic surg 10/2019 and 10/2020.   Bilateral renal cysts    simple (03/2014 MRI)   CAD (coronary  artery disease)    Cholelithiases 2018   asymptomatic   Chronic diastolic heart failure (HCC)    Chronic renal insufficiency, stage 3 (moderate) (HCC) 2022   baseline sCr 1.3-1.5 (GFR 55)   COVID-19 virus infection 02/2020   sotrovimab infusion   Diabetes mellitus with complication (HCC) 07/2014   A1c 6.8%. A1c 5.4% 11/2019   Diverticulosis    Gout    initially dx'd by diag arthrocentesis of elbow effusion in winter 2021, 3 episodes, all came after being started on chlorthalidone->I d/c'd chlorthal.   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  Improving + off oxygen as of 3 mo s/p liver transplant.  Doing well/resolved as of 02/2020 [facility] pulm f/u->they'll continue to follow.   History of blood transfusion 2018 X 4 dates   Chronic GI blood loss of unknown location despite full GI endoscopic eval, etc   History of liver transplant (HCC) 07/2019   [facility]   Hyperkalemia    Lokelma   Hyperlipemia, mixed    elevated LFTs when on statins.     Hypertension    Cr bump 04/01/16 so I changed benicar-hct to benicar plain and added amlodipine 5 mg.   Increased prostate specific antigen (PSA) velocity 2021   0.11 to 4.77 from 2020 to 2021 (he got a liver transplant and was put on antirejection meds between these psa checks).   Iron deficiency anemia  05/2016   Acute blood loss anemia: hospitalized, required transfusion x 3 U: colonoscopy and capsule study unrevealing.  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  W/u for ? hemolytic anemia to be pursued by hematologist as outpt.  Dr. Theodore Demark, GI at [facility] following, too---he rec'd onc do bone marrow bx as of Jan 2019   Iron deficiency anemia due to chronic blood loss 2018/19   GI: transfusions x >20 required; multiple endoscopies and bleeding scans unrevealing. Lysteda and octreotide + monthly iron infusions as of 04/2017. Iron infusions changed to every other month as  of 09/2018 hem f/u.   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  Changes noted on all CXRs in 2018/2019.  Liver Transplant eval 03/2018->mild restriction on PFTs but no obstruction.  See further details in PMH "pulm fibrosis" section   Microscopic hematuria    Eval unremarkable by Dr. Hillis Range.   Nonmelanoma skin cancer 08/15/2018   2020 [facility] nose, excised.  2022 R side of neck->mohs   Obesity    Pulmonary fibrosis (HCC)    PFTs: restrictive lung dz ([facility] transplant eval 05/2018). [facility] pulm eval felt this was likely hepatopulmonary syndrome->causing his hypoxia->low likelihood of progression (as of 10/04/18 transplant clinic f/u). Pulm rehab helping as of 2020/2021. OFF oxygen as of 10/2019 transp f/u.   Spleen enlarged     Past Surgical History:  Procedure Laterality Date   ABDOMINAL AORTIC ANEURYSM REPAIR  08/23/2018   [facility]   CARDIAC CATHETERIZATION     CARDIOVASCULAR STRESS TEST  01/17/2012; 05/04/16   Normal stress nuclear study x 2 (2018--Normal perfusion. LVEF 66% with normal wall motion. This is a low risk study).   CERVICAL DISCECTOMY  1992   COLONOSCOPY N/A 05/27/2016   No site/explanation for blood loss found.  Erythematous mucosa in cecum and ascending colon--Cecal bx: normal.  Procedure: COLONOSCOPY;  Surgeon: Jeani Hawking, MD;  Location: [facility] ENDOSCOPY;  Service: Endoscopy;  Laterality: N/A;   COLONOSCOPY     01/07/22 diverticulosis but NO POLYPS.  No further recommended   COLONOSCOPY W/ POLYPECTOMY  09/10/2014   Polypectomy x 2: recall 5 yrs (Dr. Loreta Ave).   COLONOSCOPY WITH PROPOFOL N/A 01/07/2022   NO FURTHER SCREENING INDICATED.  Procedure: COLONOSCOPY WITH PROPOFOL;  Surgeon: Jeani Hawking, MD;  Location: [facility] ENDOSCOPY;   Service: Gastroenterology;  Laterality: N/A;   CORONARY ANGIOPLASTY     CORONARY ARTERY BYPASS GRAFT  03/2006   CT ABDOMEN WITH CONTRAST ([facility] HX)  11/2018   CT CHEST WWO CONTAST ([facility] HX)  07/11/2019   No significant change in interstitial lung disease with possible underlying hepatopulmonary syndrome, Increased ascites in the setting of cirrhosis, tiny new right basilar nodule   DEXA  03/07/2019   T score 0.2/NORMAL   ENTEROSCOPY N/A 07/31/2016   Procedure: ENTEROSCOPY;  Surgeon: Meryl Dare, MD;  Location: [facility] ENDOSCOPY;  Service: Endoscopy;  Laterality: N/A;   ENTEROSCOPY N/A 12/02/2016   Procedure: ENTEROSCOPY;  Surgeon: Jeani Hawking, MD;  Location: [facility] ENDOSCOPY;  Service: Endoscopy;  Laterality: N/A;   ESOPHAGOGASTRODUODENOSCOPY N/A 05/25/2016   No site/explanation for blood loss found.  Procedure: ESOPHAGOGASTRODUODENOSCOPY (EGD);  Surgeon: Charna Elizabeth, MD;  Location: [facility] ENDOSCOPY;  Service: Endoscopy;  Laterality: N/A;   ESOPHAGOGASTRODUODENOSCOPY N/A 09/23/2016   Procedure: ESOPHAGOGASTRODUODENOSCOPY (EGD);  Surgeon: Jeani Hawking, MD;  Location: [facility] ENDOSCOPY;  Service: Endoscopy;  Laterality: N/A;   ESOPHAGOGASTRODUODENOSCOPY (EGD) WITH PROPOFOL N/A 08/21/2018   Procedure: ESOPHAGOGASTRODUODENOSCOPY (EGD) WITH PROPOFOL;  Surgeon: Jeani Hawking, MD;  Location: [facility] ENDOSCOPY;  Service: Endoscopy;  Laterality: N/A;   GIVENS CAPSULE STUDY N/A 05/27/2016   No source identified.  Repeat 10/2016--results pending.  Procedure: GIVENS CAPSULE STUDY;  Surgeon: Jeani Hawking, MD;  Location: [facility] ENDOSCOPY;  Service: Endoscopy;  Laterality: N/A;   GIVENS CAPSULE STUDY N/A 10/15/2016   Procedure: GIVENS CAPSULE STUDY;  Surgeon: Jeani Hawking, MD;  Location: [facility] ENDOSCOPY;  Service: Endoscopy;  Laterality: N/A;   GIVENS CAPSULE STUDY N/A 02/13/2017   Procedure: GIVENS CAPSULE STUDY;  Surgeon: Jeani Hawking, MD;  Location: [facility] ENDOSCOPY;  Service: Endoscopy;  Laterality: N/A;   LIVER TRANSPLANT   08/02/2019   For NASH cirrhosis. [facility]   NECK SURGERY  1991   NM GI BLOOD LOSS  01/27/2017   NEGATIVE   SMALL BOWEL ENTEROSCOPY  10/2016   ([facility], Dr. Theodore Demark: Double balloon enteroscopy--to the level of proximal ileum) jejunal polyps- which were removed--not bleeding & benign.  No source of bleeding was identified.   TRANSTHORACIC ECHOCARDIOGRAM  05/25/2016; 10/2017   05/2016: EF 60%, normal wall motion, grd II DD, mild aortic stenosis, dilated aortic root and ascending aorta. 10/2017: Mild LVH; no mention made of LV function.  Moderate aorticstenosis with mean gradient 19 peak gradient 41. AVA 1.1 cm^2.  05/2018 EF 55%,4.3 cm ascend aneur, mild AS. 10/2020 EF 60-65%, normal diast fxn, mod AV sclerosis w/out sten/regurg.   TRANSTHORACIC ECHOCARDIOGRAM     10/27/22; normal LV and RV fxn, mild LVH, mod AS, otherwise valves fine.   VASECTOMY  1977    Family History  Problem Relation Age of Onset   Hypertension Mother    Cancer - Other Mother        liver cancer   Heart disease Father    Heart attack Father    Cancer - Lung Father    Diabetes Father    Liver disease Sister    Anemia Sister    Heart disease Sister    Heart attack Sister    Heart disease Brother        CABG 20 YEARS AGO   Hypertension Brother    Mesothelioma Brother        HALF-BROTHER   Cancer - Other Brother        CLL   Cancer - Lung Brother        Mets to brain   Cancer - Lung Sister        HALF-SISTER   Liver disease Brother    Heart disease Brother        CABG-2012   Emphysema Maternal Grandfather    Cancer - Other Paternal Grandmother        Stomach   Heart attack Paternal Grandfather     Social History   Socioeconomic History   Marital status: Married    Spouse name: Not on file   Number of children: Not on file   Years of education: Not on file   Highest education level: 12th grade  Occupational History   Not on file  Tobacco Use   Smoking status: Former    Types: Cigarettes   Smokeless tobacco:  Never   Tobacco comments:    QUIT I [id number]  Vaping Use   Vaping status: Never Used  Substance and Sexual Activity   Alcohol use: No   Drug use: No   Sexual activity: Yes  Other Topics Concern   Not on file  Social History Narrative   Married, 3 grown children, 3 GCs.   Educ: 10th grade.   Occupation: Retired Surveyor, minerals.   Tob: quit 1983, smoked about 20 pack-yr hx prior.   No alcohol.   Social Drivers of Corporate investment banker Strain: Low Risk  (01/23/2023)   Overall Financial Resource Strain (CARDIA)    Difficulty of Paying Living Expenses: Not hard at all  Food Insecurity: No Food Insecurity (01/23/2023)   Hunger Vital Sign    Worried About Running Out of Food in the Last Year: Never true    Ran Out of Food in the Last Year: Never true  Transportation Needs: No Transportation Needs (01/23/2023)   PRAPARE - Administrator, Civil Service (Medical): No    Lack of Transportation (Non-Medical): No  Physical Activity: Inactive (01/23/2023)   Exercise Vital Sign    Days of Exercise per Week: 0 days    Minutes of Exercise per Session: 10 min  Stress: No Stress Concern Present (01/23/2023)   [employer] of Occupational Health - Occupational Stress Questionnaire    Feeling of Stress : Not at all  Social Connections: Socially Integrated (01/23/2023)   Social Connection and Isolation Panel [NHANES]    Frequency of Communication with Friends and Family: More than three times a week    Frequency of Social Gatherings with Friends and Family: Once a week    Attends Religious Services: 1 to 4 times per year    Active Member of [employer] or Organizations: Yes    Attends Banker Meetings: 1 to 4 times per year    Marital Status: Married  Catering manager Violence: Not At Risk (07/27/2022)   Humiliation, Afraid, Rape, and Kick questionnaire    Fear of Current or Ex-Partner: No    Emotionally Abused: No    Physically Abused: No    Sexually Abused: No     Outpatient Medications Prior to Visit  Medication Sig Dispense Refill   ammonium lactate (AMLACTIN) 12 % cream Apply 2 times daily to forearms and hands in preparation for treatment with Efudex (fluorouracil) 385 g 99   aspirin EC 81 MG tablet Take 81 mg by mouth daily. Swallow whole.     Magnesium 400 MG TABS Take 400 mg by mouth daily.     metFORMIN (GLUCOPHAGE) 500 MG tablet Take 1 tablet (500 mg total) by mouth 2 (two) times daily with a meal. 180 tablet 0   mycophenolate (CELLCEPT) 250 MG capsule Take 2 capsules by mouth in the morning and 2 capsules in the evening 360 capsule 3   pantoprazole (PROTONIX) 40 MG tablet Take 1 tablet (40 mg total) by mouth daily. 90 tablet 3   rosuvastatin (CRESTOR) 10 MG tablet Take 1 tablet (10 mg total) by mouth daily. 90 tablet 3   tacrolimus (PROGRAF) 1 MG capsule Take 2 capsules (2 mg total) by mouth every morning AND 1 capsule (1 mg total) every evening. 270 capsule 3   triamcinolone cream (KENALOG) 0.1 % Apply 2 times a day to rash. Follow with CeraVe Cream.  Stop when smooth. 80 g 2   amLODipine (NORVASC) 10 MG tablet Take 1 tablet (10 mg total) by mouth daily. 90 tablet 3   furosemide (LASIX) 20 MG  tablet Take 1 tablet (20 mg total) by mouth daily. 90 tablet 3   metoprolol tartrate (LOPRESSOR) 100 MG tablet Take 1 tablet (100 mg total) by mouth 2 (two) times daily. (Patient taking differently: Take 100 mg by mouth 2 (two) times daily. 1/2 TAB) 180 tablet 3   Facility-Administered Medications Prior to Visit  Medication Dose Route Frequency Provider Last Rate Last Admin   heparin lock flush 100 unit/mL  500 Units Intracatheter Daily PRN Malachy Mood, MD       sodium chloride flush (NS) 0.9 % injection 10 mL  10 mL Intracatheter PRN Malachy Mood, MD        Allergies  Allergen Reactions   Niacinamide Itching    Improved upon discontinuation    Thiazide-Type Diuretics Other (See Comments)    Chlorthalidone caused gout   Quinolones Other (See  Comments)    CONTRAINDICATED D/T PT HAVING AAA    Review of Systems  Constitutional:  Negative for appetite change, chills, fatigue and fever.  HENT:  Negative for congestion, dental problem, ear pain and sore throat.   Eyes:  Negative for discharge, redness and visual disturbance.  Respiratory:  Positive for shortness of breath. Negative for cough, chest tightness and wheezing.   Cardiovascular:  Negative for chest pain, palpitations and leg swelling.  Gastrointestinal:  Negative for abdominal pain, blood in stool, diarrhea, nausea and vomiting.  Genitourinary:  Negative for difficulty urinating, dysuria, flank pain, frequency, hematuria and urgency.  Musculoskeletal:  Negative for arthralgias, back pain, joint swelling, myalgias and neck stiffness.  Skin:  Negative for pallor and rash.  Neurological:  Negative for dizziness, speech difficulty, weakness and headaches.  Hematological:  Negative for adenopathy. Does not bruise/bleed easily.  Psychiatric/Behavioral:  Negative for confusion and sleep disturbance. The patient is not nervous/anxious.     PE;    01/26/2023    9:24 AM 01/26/2023    9:17 AM 01/18/2023   11:20 AM  Vitals with BMI  Height  5\' 9"  5\' 9"   Weight  228 lbs 13 oz 232 lbs  BMI  33.77 34.24  Systolic 130 144 657  Diastolic 80 82 82  Pulse  73 73    Gen: Alert, well appearing.  Patient is oriented to person, place, time, and situation. AFFECT: pleasant, lucid thought and speech. ENT: Ears: EACs clear, normal epithelium.  TMs with good light reflex and landmarks bilaterally.  Eyes: no injection, icteris, swelling, or exudate.  EOMI, PERRLA. Nose: no drainage or turbinate edema/swelling.  No injection or focal lesion.  Mouth: lips without lesion/swelling.  Oral mucosa pink and moist.  Dentition intact and without obvious caries or gingival swelling.  Oropharynx without erythema, exudate, or swelling.  Neck: supple/nontender.  No LAD, mass, or TM.  Carotid pulses 2+  bilaterally, without bruits. CV: Regular rhythm and rate, 2-3/6 systolic murmur heard best at the cardiac base and radiating up into the infraclavicular area bilaterally. No rub/g.   LUNGS: Soft/dry inspiratory crackles heard early in the inspiratory phase, nonlabored resps, good aeration in all lung fields. ABD: soft, NT, ND, BS normal.  No hepatospenomegaly or mass.  No bruits. EXT: no clubbing, cyanosis, or edema.  Musculoskeletal: no joint swelling, erythema, warmth, or tenderness.  ROM of all joints intact. Skin - no sores or suspicious lesions or rashes or color changes  Pertinent labs:  Lab Results  Component Value Date   TSH 1.54 01/13/2021   Lab Results  Component Value Date   WBC 10.9 10/18/2022  HGB 14.2 10/18/2022   HCT 46 10/18/2022   MCV 84.3 02/25/2022   PLT 378 10/18/2022   Lab Results  Component Value Date   CREATININE 1.4 (A) 10/31/2022   BUN 13 10/31/2022   NA 138 10/31/2022   K 5.1 10/31/2022   CL 101 10/31/2022   CO2 24 (A) 10/31/2022   Lab Results  Component Value Date   ALT 12 10/18/2022   AST 12 (A) 10/18/2022   ALKPHOS 101 10/18/2022   BILITOT 1.1 07/21/2022   Lab Results  Component Value Date   CHOL 94 07/21/2022   Lab Results  Component Value Date   HDL 25.90 (L) 07/21/2022   Lab Results  Component Value Date   LDLCALC 41 07/21/2022   Lab Results  Component Value Date   TRIG 136.0 07/21/2022   Lab Results  Component Value Date   CHOLHDL 4 07/21/2022   Lab Results  Component Value Date   PSA 0.80 07/14/2021   PSA 1.06 03/23/2020   PSA 4.27 (H) 11/27/2019   Lab Results  Component Value Date   HGBA1C 5.6 01/26/2023   HGBA1C 5.6 01/26/2023   HGBA1C 5.6 (A) 01/26/2023   HGBA1C 5.6 01/26/2023   ASSESSMENT AND PLAN:   #1 health maintenance exam: Reviewed age and gender appropriate health maintenance issues (prudent diet, regular exercise, health risks of tobacco and excessive alcohol, use of seatbelts, fire alarms in home,  use of sunscreen).  Also reviewed age and gender appropriate health screening as well as vaccine recommendations. Vaccines:  All utd. Labs: cbc, flp, PSA. Prostate ca screening: PSA today. Colon ca screening: UTD 01/07/22--->no further colonoscopies recommended (Per GI)."  #2 diabetes with nephropathy. Well-controlled.  POC hemoglobin A1c today is 5.6%. Continue metformin 500 mg twice daily so long as GFR remains above 45 mL a minute.  #3 hypertension,. Blood pressures have been on the low side so his irbesartan dose was cut back pretty recently by transplant clinic, then it was was discontinued completely 1 week ago by his cardiologist. Today blood pressure is normal and they have been normal at home lately. Will continue amlodipine 10 mg a day and Lopressor 100 mg twice a day. Checking electrolytes and creatinine today.  #4 hyperkalemia. Was on Lokelma but this was discontinued a week ago due to concern that it was causing some periodic fluid retention/volume overload. Monitor potassium today.  #5 chronic renal insufficiency stage IIIa. He avoids NSAIDs. He knows to try to hydrate well. Monitor basic metabolic panel today.  #6 DOE. Suspect the crackles on lung exam today are due to scarring/fibrosis. He will be getting pulmonologist follow-up soon. He does not seem volume overloaded at this time. Echo about 3 months ago was reassuring.  An After Visit Summary was printed and given to the patient.  FOLLOW UP:  Return in about 3 months (around 04/26/2023) for routine chronic illness f/u.  Signed:  Santiago Bumpers, MD           01/26/2023

## 2023-01-31 ENCOUNTER — Other Ambulatory Visit (HOSPITAL_COMMUNITY): Payer: Self-pay

## 2023-02-10 ENCOUNTER — Other Ambulatory Visit (HOSPITAL_COMMUNITY): Payer: Self-pay

## 2023-02-14 ENCOUNTER — Other Ambulatory Visit: Payer: Self-pay

## 2023-02-15 ENCOUNTER — Other Ambulatory Visit: Payer: Self-pay

## 2023-02-15 NOTE — Progress Notes (Signed)
 Specialty Pharmacy Ongoing Clinical Assessment Note  William [name] is a 73 y.o. male who is being followed by the specialty pharmacy service for RxSp Transplant   Patient's specialty medication(s) reviewed today: Tacrolimus  (PROGRAF )   Missed doses in the last 4 weeks: 0   Patient/Caregiver did not have any additional questions or concerns.   Therapeutic benefit summary: Patient is achieving benefit   Adverse events/side effects summary: No adverse events/side effects   Patient's therapy is appropriate to: Continue    Goals Addressed             This Visit's Progress    Improve or maintain quality of life       Patient is on track. Patient will maintain adherence         Follow up:  6 months  Mitzie GORMAN [facility] Specialty Pharmacist

## 2023-02-15 NOTE — Progress Notes (Signed)
 Specialty Pharmacy Refill Coordination Note  William [name] is a 73 y.o. male who's wife was contacted today regarding refills of specialty medication(s) Tacrolimus  (PROGRAF )   Patient requested [facility] at [facility] at [place] date: 02/21/23  Medication will be filled on 02/22/23.

## 2023-02-20 ENCOUNTER — Other Ambulatory Visit: Payer: Self-pay

## 2023-02-28 ENCOUNTER — Other Ambulatory Visit (HOSPITAL_COMMUNITY): Payer: Self-pay

## 2023-02-28 DIAGNOSIS — D849 Immunodeficiency, unspecified: Secondary | ICD-10-CM | POA: Diagnosis not present

## 2023-02-28 DIAGNOSIS — Z944 Liver transplant status: Secondary | ICD-10-CM | POA: Diagnosis not present

## 2023-02-28 DIAGNOSIS — Z5181 Encounter for therapeutic drug level monitoring: Secondary | ICD-10-CM | POA: Diagnosis not present

## 2023-02-28 DIAGNOSIS — N1831 Chronic kidney disease, stage 3a: Secondary | ICD-10-CM | POA: Diagnosis not present

## 2023-02-28 DIAGNOSIS — I77810 Thoracic aortic ectasia: Secondary | ICD-10-CM | POA: Diagnosis not present

## 2023-02-28 DIAGNOSIS — I350 Nonrheumatic aortic (valve) stenosis: Secondary | ICD-10-CM | POA: Diagnosis not present

## 2023-02-28 MED ORDER — MYCOPHENOLATE MOFETIL 250 MG PO CAPS
500.0000 mg | ORAL_CAPSULE | Freq: Two times a day (BID) | ORAL | 3 refills | Status: DC
  Filled 2023-02-28: qty 360, 90d supply, fill #0

## 2023-03-02 ENCOUNTER — Other Ambulatory Visit (HOSPITAL_COMMUNITY): Payer: Self-pay

## 2023-03-02 ENCOUNTER — Other Ambulatory Visit: Payer: Self-pay

## 2023-03-02 DIAGNOSIS — D2260 Melanocytic nevi of unspecified upper limb, including shoulder: Secondary | ICD-10-CM | POA: Diagnosis not present

## 2023-03-02 DIAGNOSIS — D2270 Melanocytic nevi of unspecified lower limb, including hip: Secondary | ICD-10-CM | POA: Diagnosis not present

## 2023-03-02 DIAGNOSIS — Z85828 Personal history of other malignant neoplasm of skin: Secondary | ICD-10-CM | POA: Diagnosis not present

## 2023-03-02 DIAGNOSIS — D225 Melanocytic nevi of trunk: Secondary | ICD-10-CM | POA: Diagnosis not present

## 2023-03-02 DIAGNOSIS — D849 Immunodeficiency, unspecified: Secondary | ICD-10-CM | POA: Diagnosis not present

## 2023-03-02 DIAGNOSIS — L814 Other melanin hyperpigmentation: Secondary | ICD-10-CM | POA: Diagnosis not present

## 2023-03-02 DIAGNOSIS — L570 Actinic keratosis: Secondary | ICD-10-CM | POA: Diagnosis not present

## 2023-03-02 DIAGNOSIS — L578 Other skin changes due to chronic exposure to nonionizing radiation: Secondary | ICD-10-CM | POA: Diagnosis not present

## 2023-03-02 DIAGNOSIS — L720 Epidermal cyst: Secondary | ICD-10-CM | POA: Diagnosis not present

## 2023-03-02 MED ORDER — FLUOROURACIL 5 % EX CREA
TOPICAL_CREAM | Freq: Two times a day (BID) | CUTANEOUS | 2 refills | Status: DC
  Filled 2023-03-02: qty 40, 30d supply, fill #0

## 2023-03-03 ENCOUNTER — Other Ambulatory Visit: Payer: Self-pay

## 2023-03-03 ENCOUNTER — Other Ambulatory Visit (HOSPITAL_COMMUNITY): Payer: Self-pay

## 2023-03-03 MED ORDER — MYCOPHENOLATE MOFETIL 250 MG PO CAPS
ORAL_CAPSULE | ORAL | 3 refills | Status: DC
  Filled 2023-03-03 – 2023-03-23 (×2): qty 270, 90d supply, fill #0
  Filled 2023-06-15: qty 270, 90d supply, fill #1
  Filled 2023-09-14: qty 270, 90d supply, fill #2
  Filled 2023-12-14 (×2): qty 270, 90d supply, fill #3

## 2023-03-04 ENCOUNTER — Other Ambulatory Visit (HOSPITAL_COMMUNITY): Payer: Self-pay

## 2023-03-09 ENCOUNTER — Other Ambulatory Visit: Payer: Self-pay

## 2023-03-14 ENCOUNTER — Other Ambulatory Visit (HOSPITAL_COMMUNITY): Payer: Self-pay

## 2023-03-15 ENCOUNTER — Other Ambulatory Visit (HOSPITAL_COMMUNITY): Payer: Self-pay

## 2023-03-15 DIAGNOSIS — Z944 Liver transplant status: Secondary | ICD-10-CM | POA: Diagnosis not present

## 2023-03-15 DIAGNOSIS — D849 Immunodeficiency, unspecified: Secondary | ICD-10-CM | POA: Diagnosis not present

## 2023-03-16 DIAGNOSIS — K7681 Hepatopulmonary syndrome: Secondary | ICD-10-CM | POA: Diagnosis not present

## 2023-03-16 DIAGNOSIS — I7121 Aneurysm of the ascending aorta, without rupture: Secondary | ICD-10-CM | POA: Diagnosis not present

## 2023-03-16 DIAGNOSIS — R0602 Shortness of breath: Secondary | ICD-10-CM | POA: Diagnosis not present

## 2023-03-16 DIAGNOSIS — I350 Nonrheumatic aortic (valve) stenosis: Secondary | ICD-10-CM | POA: Diagnosis not present

## 2023-03-16 DIAGNOSIS — J8410 Pulmonary fibrosis, unspecified: Secondary | ICD-10-CM | POA: Diagnosis not present

## 2023-03-16 DIAGNOSIS — D84821 Immunodeficiency due to drugs: Secondary | ICD-10-CM | POA: Diagnosis not present

## 2023-03-16 DIAGNOSIS — K7581 Nonalcoholic steatohepatitis (NASH): Secondary | ICD-10-CM | POA: Diagnosis not present

## 2023-03-16 DIAGNOSIS — R0989 Other specified symptoms and signs involving the circulatory and respiratory systems: Secondary | ICD-10-CM | POA: Diagnosis not present

## 2023-03-16 DIAGNOSIS — J849 Interstitial pulmonary disease, unspecified: Secondary | ICD-10-CM | POA: Diagnosis not present

## 2023-03-17 DIAGNOSIS — I350 Nonrheumatic aortic (valve) stenosis: Secondary | ICD-10-CM | POA: Diagnosis not present

## 2023-03-17 DIAGNOSIS — I77810 Thoracic aortic ectasia: Secondary | ICD-10-CM | POA: Diagnosis not present

## 2023-03-23 ENCOUNTER — Other Ambulatory Visit: Payer: Self-pay

## 2023-03-23 ENCOUNTER — Other Ambulatory Visit (HOSPITAL_COMMUNITY): Payer: Self-pay

## 2023-03-23 NOTE — Progress Notes (Signed)
Specialty Pharmacy Refill Coordination Note  William [name] is a 73 y.o. male contacted today regarding refills of specialty medication(s) Mycophenolate Mofetil (CELLCEPT)   Patient requested Pickup at [facility] at [place] date: 03/30/23   Medication will be filled on 03/29/23.

## 2023-03-28 ENCOUNTER — Other Ambulatory Visit: Payer: Self-pay | Admitting: Family Medicine

## 2023-03-28 ENCOUNTER — Other Ambulatory Visit (HOSPITAL_COMMUNITY): Payer: Self-pay

## 2023-03-28 ENCOUNTER — Other Ambulatory Visit: Payer: Self-pay

## 2023-03-28 MED ORDER — METFORMIN HCL 500 MG PO TABS
500.0000 mg | ORAL_TABLET | Freq: Two times a day (BID) | ORAL | 0 refills | Status: DC
  Filled 2023-03-28: qty 60, 30d supply, fill #0

## 2023-03-29 ENCOUNTER — Other Ambulatory Visit: Payer: Self-pay

## 2023-04-03 DIAGNOSIS — Z944 Liver transplant status: Secondary | ICD-10-CM | POA: Diagnosis not present

## 2023-04-03 DIAGNOSIS — D849 Immunodeficiency, unspecified: Secondary | ICD-10-CM | POA: Diagnosis not present

## 2023-04-04 ENCOUNTER — Other Ambulatory Visit (HOSPITAL_COMMUNITY): Payer: Self-pay

## 2023-04-04 ENCOUNTER — Other Ambulatory Visit: Payer: Self-pay | Admitting: Family Medicine

## 2023-04-05 ENCOUNTER — Other Ambulatory Visit: Payer: Self-pay

## 2023-04-05 LAB — COMPREHENSIVE METABOLIC PANEL
Albumin: 4.6 (ref 3.5–5.0)
Calcium: 9.9 (ref 8.7–10.7)
Globulin: 2.5
eGFR: 56

## 2023-04-05 LAB — HEPATIC FUNCTION PANEL
ALT: 11 U/L (ref 10–40)
AST: 9 — AB (ref 14–40)
Alkaline Phosphatase: 104 (ref 25–125)
Bilirubin, Total: 0.7

## 2023-04-05 LAB — CBC: RBC: 5.85 — AB (ref 3.87–5.11)

## 2023-04-05 LAB — BASIC METABOLIC PANEL
BUN: 11 (ref 4–21)
CO2: 24 — AB (ref 13–22)
Chloride: 100 (ref 99–108)
Creatinine: 1.3 (ref 0.6–1.3)
Glucose: 123
Potassium: 5.2 meq/L — AB (ref 3.5–5.1)
Sodium: 136 — AB (ref 137–147)

## 2023-04-05 LAB — CBC AND DIFFERENTIAL
HCT: 49 (ref 41–53)
Hemoglobin: 14.8 (ref 13.5–17.5)
Neutrophils Absolute: 5.4
Platelets: 403 10*3/uL — AB (ref 150–400)
WBC: 8.1

## 2023-04-07 DIAGNOSIS — R0989 Other specified symptoms and signs involving the circulatory and respiratory systems: Secondary | ICD-10-CM | POA: Diagnosis not present

## 2023-04-07 DIAGNOSIS — N1831 Chronic kidney disease, stage 3a: Secondary | ICD-10-CM | POA: Diagnosis not present

## 2023-04-07 DIAGNOSIS — I1310 Hypertensive heart and chronic kidney disease without heart failure, with stage 1 through stage 4 chronic kidney disease, or unspecified chronic kidney disease: Secondary | ICD-10-CM | POA: Diagnosis not present

## 2023-04-07 DIAGNOSIS — R0902 Hypoxemia: Secondary | ICD-10-CM | POA: Diagnosis not present

## 2023-04-07 DIAGNOSIS — D849 Immunodeficiency, unspecified: Secondary | ICD-10-CM | POA: Diagnosis not present

## 2023-04-07 DIAGNOSIS — Z951 Presence of aortocoronary bypass graft: Secondary | ICD-10-CM | POA: Diagnosis not present

## 2023-04-07 DIAGNOSIS — I350 Nonrheumatic aortic (valve) stenosis: Secondary | ICD-10-CM | POA: Diagnosis not present

## 2023-04-07 DIAGNOSIS — Z944 Liver transplant status: Secondary | ICD-10-CM | POA: Diagnosis not present

## 2023-04-07 DIAGNOSIS — R9431 Abnormal electrocardiogram [ECG] [EKG]: Secondary | ICD-10-CM | POA: Diagnosis not present

## 2023-04-07 DIAGNOSIS — I2510 Atherosclerotic heart disease of native coronary artery without angina pectoris: Secondary | ICD-10-CM | POA: Diagnosis not present

## 2023-04-07 DIAGNOSIS — J849 Interstitial pulmonary disease, unspecified: Secondary | ICD-10-CM | POA: Diagnosis not present

## 2023-04-07 DIAGNOSIS — K7460 Unspecified cirrhosis of liver: Secondary | ICD-10-CM | POA: Diagnosis not present

## 2023-04-07 DIAGNOSIS — Z87891 Personal history of nicotine dependence: Secondary | ICD-10-CM | POA: Diagnosis not present

## 2023-04-07 DIAGNOSIS — K7581 Nonalcoholic steatohepatitis (NASH): Secondary | ICD-10-CM | POA: Diagnosis not present

## 2023-04-07 DIAGNOSIS — I7090 Unspecified atherosclerosis: Secondary | ICD-10-CM | POA: Diagnosis not present

## 2023-04-07 DIAGNOSIS — E1122 Type 2 diabetes mellitus with diabetic chronic kidney disease: Secondary | ICD-10-CM | POA: Diagnosis not present

## 2023-04-07 DIAGNOSIS — I77810 Thoracic aortic ectasia: Secondary | ICD-10-CM | POA: Diagnosis not present

## 2023-04-07 DIAGNOSIS — E785 Hyperlipidemia, unspecified: Secondary | ICD-10-CM | POA: Diagnosis not present

## 2023-04-07 DIAGNOSIS — I25810 Atherosclerosis of coronary artery bypass graft(s) without angina pectoris: Secondary | ICD-10-CM | POA: Diagnosis not present

## 2023-04-07 DIAGNOSIS — Z01810 Encounter for preprocedural cardiovascular examination: Secondary | ICD-10-CM | POA: Diagnosis not present

## 2023-04-07 DIAGNOSIS — Z95828 Presence of other vascular implants and grafts: Secondary | ICD-10-CM | POA: Diagnosis not present

## 2023-04-14 ENCOUNTER — Encounter: Payer: Self-pay | Admitting: Family Medicine

## 2023-04-14 NOTE — Telephone Encounter (Signed)
 Okay, thank you for the update. William [name], please abstract his labs.

## 2023-04-14 NOTE — Telephone Encounter (Signed)
 Please review attachments.

## 2023-04-14 NOTE — Telephone Encounter (Signed)
 Please review attachments below

## 2023-04-17 NOTE — Telephone Encounter (Signed)
 Labs abstracted

## 2023-04-20 ENCOUNTER — Other Ambulatory Visit: Payer: Self-pay

## 2023-04-20 ENCOUNTER — Other Ambulatory Visit (HOSPITAL_COMMUNITY): Payer: Self-pay

## 2023-04-20 MED ORDER — ACITRETIN 10 MG PO CAPS
10.0000 mg | ORAL_CAPSULE | Freq: Every day | ORAL | 5 refills | Status: DC
  Filled 2023-04-20: qty 30, 30d supply, fill #0

## 2023-04-27 LAB — CBC AND DIFFERENTIAL: Platelets: 359 10*3/uL (ref 150–400)

## 2023-04-28 DIAGNOSIS — C44629 Squamous cell carcinoma of skin of left upper limb, including shoulder: Secondary | ICD-10-CM | POA: Diagnosis not present

## 2023-05-02 ENCOUNTER — Other Ambulatory Visit: Payer: Self-pay

## 2023-05-02 DIAGNOSIS — Z944 Liver transplant status: Secondary | ICD-10-CM | POA: Diagnosis not present

## 2023-05-02 DIAGNOSIS — D849 Immunodeficiency, unspecified: Secondary | ICD-10-CM | POA: Diagnosis not present

## 2023-05-02 LAB — COMPREHENSIVE METABOLIC PANEL WITH GFR
Albumin: 4.4 (ref 3.5–5.0)
Calcium: 9.8 (ref 8.7–10.7)
Globulin: 2.5
eGFR: 53

## 2023-05-02 LAB — CBC AND DIFFERENTIAL
HCT: 48 (ref 41–53)
Hemoglobin: 15 (ref 13.5–17.5)
Neutrophils Absolute: 805
WBC: 11.7

## 2023-05-02 LAB — BASIC METABOLIC PANEL WITH GFR
BUN: 12 (ref 4–21)
CO2: 25 — AB (ref 13–22)
Chloride: 98 — AB (ref 99–108)
Creatinine: 1.4 — AB (ref 0.6–1.3)
Glucose: 110
Potassium: 5.1 meq/L (ref 3.5–5.1)
Sodium: 135 — AB (ref 137–147)

## 2023-05-02 LAB — HEPATIC FUNCTION PANEL
ALT: 8 U/L — AB (ref 10–40)
AST: 11 — AB (ref 14–40)
Bilirubin, Total: 2.5

## 2023-05-02 LAB — CBC: RBC: 5.87 — AB (ref 3.87–5.11)

## 2023-05-02 NOTE — Progress Notes (Signed)
 Specialty Pharmacy Refill Coordination Note  William [name] is a 73 y.o. male contacted today regarding refills of specialty medication(s) Tacrolimus (PROGRAF)   Patient requested (Patient-Rptd) Pickup at [facility] at [facility] date: (Patient-Rptd) 05/23/23   Medication will be filled on 05/22/23.

## 2023-05-04 ENCOUNTER — Encounter: Payer: Self-pay | Admitting: Family Medicine

## 2023-05-10 DIAGNOSIS — R918 Other nonspecific abnormal finding of lung field: Secondary | ICD-10-CM | POA: Diagnosis not present

## 2023-05-10 DIAGNOSIS — N189 Chronic kidney disease, unspecified: Secondary | ICD-10-CM | POA: Diagnosis not present

## 2023-05-10 DIAGNOSIS — E1122 Type 2 diabetes mellitus with diabetic chronic kidney disease: Secondary | ICD-10-CM | POA: Diagnosis not present

## 2023-05-10 DIAGNOSIS — I2510 Atherosclerotic heart disease of native coronary artery without angina pectoris: Secondary | ICD-10-CM | POA: Diagnosis not present

## 2023-05-10 DIAGNOSIS — I712 Thoracic aortic aneurysm, without rupture, unspecified: Secondary | ICD-10-CM | POA: Diagnosis not present

## 2023-05-10 DIAGNOSIS — I350 Nonrheumatic aortic (valve) stenosis: Secondary | ICD-10-CM | POA: Diagnosis not present

## 2023-05-10 DIAGNOSIS — D509 Iron deficiency anemia, unspecified: Secondary | ICD-10-CM | POA: Diagnosis not present

## 2023-05-10 DIAGNOSIS — I25810 Atherosclerosis of coronary artery bypass graft(s) without angina pectoris: Secondary | ICD-10-CM | POA: Diagnosis not present

## 2023-05-10 DIAGNOSIS — Z87891 Personal history of nicotine dependence: Secondary | ICD-10-CM | POA: Diagnosis not present

## 2023-05-10 DIAGNOSIS — I129 Hypertensive chronic kidney disease with stage 1 through stage 4 chronic kidney disease, or unspecified chronic kidney disease: Secondary | ICD-10-CM | POA: Diagnosis not present

## 2023-05-10 DIAGNOSIS — Z944 Liver transplant status: Secondary | ICD-10-CM | POA: Diagnosis not present

## 2023-05-10 DIAGNOSIS — E785 Hyperlipidemia, unspecified: Secondary | ICD-10-CM | POA: Diagnosis not present

## 2023-05-12 ENCOUNTER — Other Ambulatory Visit: Payer: Self-pay | Admitting: Family Medicine

## 2023-05-12 ENCOUNTER — Other Ambulatory Visit: Payer: Self-pay

## 2023-05-12 ENCOUNTER — Other Ambulatory Visit (HOSPITAL_COMMUNITY): Payer: Self-pay

## 2023-05-12 MED ORDER — METFORMIN HCL 500 MG PO TABS
500.0000 mg | ORAL_TABLET | Freq: Two times a day (BID) | ORAL | 0 refills | Status: DC
  Filled 2023-05-12: qty 60, 30d supply, fill #0

## 2023-05-22 ENCOUNTER — Other Ambulatory Visit: Payer: Self-pay

## 2023-05-24 ENCOUNTER — Encounter: Payer: Self-pay | Admitting: Family Medicine

## 2023-05-24 NOTE — Telephone Encounter (Signed)
 Printed and placed on PCP desk for review.

## 2023-06-15 ENCOUNTER — Other Ambulatory Visit (HOSPITAL_COMMUNITY): Payer: Self-pay

## 2023-06-15 ENCOUNTER — Other Ambulatory Visit: Payer: Self-pay

## 2023-06-16 DIAGNOSIS — J849 Interstitial pulmonary disease, unspecified: Secondary | ICD-10-CM | POA: Diagnosis not present

## 2023-06-16 DIAGNOSIS — R0602 Shortness of breath: Secondary | ICD-10-CM | POA: Diagnosis not present

## 2023-06-16 DIAGNOSIS — Z944 Liver transplant status: Secondary | ICD-10-CM | POA: Diagnosis not present

## 2023-06-16 DIAGNOSIS — I350 Nonrheumatic aortic (valve) stenosis: Secondary | ICD-10-CM | POA: Diagnosis not present

## 2023-06-19 ENCOUNTER — Other Ambulatory Visit: Payer: Self-pay

## 2023-06-20 ENCOUNTER — Other Ambulatory Visit: Payer: Self-pay

## 2023-06-20 ENCOUNTER — Encounter: Payer: Self-pay | Admitting: Family Medicine

## 2023-06-20 NOTE — Progress Notes (Signed)
 Specialty Pharmacy Refill Coordination Note  William [name] is a 73 y.o. male contacted today regarding refills of specialty medication(s) Mycophenolate  Mofetil (CELLCEPT )   Patient requested (Patient-Rptd) Pickup at [facility] at [facility] date: (Patient-Rptd) 06/21/23   Medication will be filled on 06/20/23.

## 2023-06-26 DIAGNOSIS — Z7982 Long term (current) use of aspirin: Secondary | ICD-10-CM | POA: Diagnosis not present

## 2023-06-26 DIAGNOSIS — Z87891 Personal history of nicotine dependence: Secondary | ICD-10-CM | POA: Diagnosis not present

## 2023-06-26 DIAGNOSIS — I517 Cardiomegaly: Secondary | ICD-10-CM | POA: Diagnosis not present

## 2023-06-26 DIAGNOSIS — Z95828 Presence of other vascular implants and grafts: Secondary | ICD-10-CM | POA: Diagnosis not present

## 2023-06-26 DIAGNOSIS — M1990 Unspecified osteoarthritis, unspecified site: Secondary | ICD-10-CM | POA: Diagnosis not present

## 2023-06-26 DIAGNOSIS — I2510 Atherosclerotic heart disease of native coronary artery without angina pectoris: Secondary | ICD-10-CM | POA: Diagnosis not present

## 2023-06-26 DIAGNOSIS — J984 Other disorders of lung: Secondary | ICD-10-CM | POA: Diagnosis not present

## 2023-06-26 DIAGNOSIS — Z951 Presence of aortocoronary bypass graft: Secondary | ICD-10-CM | POA: Diagnosis not present

## 2023-06-26 DIAGNOSIS — I272 Pulmonary hypertension, unspecified: Secondary | ICD-10-CM | POA: Diagnosis not present

## 2023-06-26 DIAGNOSIS — Z9981 Dependence on supplemental oxygen: Secondary | ICD-10-CM | POA: Diagnosis not present

## 2023-06-26 DIAGNOSIS — Z8679 Personal history of other diseases of the circulatory system: Secondary | ICD-10-CM | POA: Diagnosis not present

## 2023-06-26 DIAGNOSIS — Z952 Presence of prosthetic heart valve: Secondary | ICD-10-CM | POA: Diagnosis not present

## 2023-06-26 DIAGNOSIS — K219 Gastro-esophageal reflux disease without esophagitis: Secondary | ICD-10-CM | POA: Diagnosis not present

## 2023-06-26 DIAGNOSIS — Z4682 Encounter for fitting and adjustment of non-vascular catheter: Secondary | ICD-10-CM | POA: Diagnosis not present

## 2023-06-26 DIAGNOSIS — D84821 Immunodeficiency due to drugs: Secondary | ICD-10-CM | POA: Diagnosis not present

## 2023-06-26 DIAGNOSIS — Z9689 Presence of other specified functional implants: Secondary | ICD-10-CM | POA: Diagnosis not present

## 2023-06-26 DIAGNOSIS — Z944 Liver transplant status: Secondary | ICD-10-CM | POA: Diagnosis not present

## 2023-06-26 DIAGNOSIS — I130 Hypertensive heart and chronic kidney disease with heart failure and stage 1 through stage 4 chronic kidney disease, or unspecified chronic kidney disease: Secondary | ICD-10-CM | POA: Diagnosis not present

## 2023-06-26 DIAGNOSIS — Z79899 Other long term (current) drug therapy: Secondary | ICD-10-CM | POA: Diagnosis not present

## 2023-06-26 DIAGNOSIS — T380X5A Adverse effect of glucocorticoids and synthetic analogues, initial encounter: Secondary | ICD-10-CM | POA: Diagnosis not present

## 2023-06-26 DIAGNOSIS — J9621 Acute and chronic respiratory failure with hypoxia: Secondary | ICD-10-CM | POA: Diagnosis not present

## 2023-06-26 DIAGNOSIS — R918 Other nonspecific abnormal finding of lung field: Secondary | ICD-10-CM | POA: Diagnosis not present

## 2023-06-26 DIAGNOSIS — Z01818 Encounter for other preprocedural examination: Secondary | ICD-10-CM | POA: Diagnosis not present

## 2023-06-26 DIAGNOSIS — E1151 Type 2 diabetes mellitus with diabetic peripheral angiopathy without gangrene: Secondary | ICD-10-CM | POA: Diagnosis not present

## 2023-06-26 DIAGNOSIS — I350 Nonrheumatic aortic (valve) stenosis: Secondary | ICD-10-CM | POA: Diagnosis not present

## 2023-06-26 DIAGNOSIS — E1122 Type 2 diabetes mellitus with diabetic chronic kidney disease: Secondary | ICD-10-CM | POA: Diagnosis not present

## 2023-06-26 DIAGNOSIS — Z006 Encounter for examination for normal comparison and control in clinical research program: Secondary | ICD-10-CM | POA: Diagnosis not present

## 2023-06-26 DIAGNOSIS — I129 Hypertensive chronic kidney disease with stage 1 through stage 4 chronic kidney disease, or unspecified chronic kidney disease: Secondary | ICD-10-CM | POA: Diagnosis not present

## 2023-06-26 DIAGNOSIS — E785 Hyperlipidemia, unspecified: Secondary | ICD-10-CM | POA: Diagnosis not present

## 2023-06-26 DIAGNOSIS — J849 Interstitial pulmonary disease, unspecified: Secondary | ICD-10-CM | POA: Diagnosis not present

## 2023-06-26 DIAGNOSIS — N1831 Chronic kidney disease, stage 3a: Secondary | ICD-10-CM | POA: Diagnosis not present

## 2023-06-26 DIAGNOSIS — Z452 Encounter for adjustment and management of vascular access device: Secondary | ICD-10-CM | POA: Diagnosis not present

## 2023-06-26 DIAGNOSIS — Z7952 Long term (current) use of systemic steroids: Secondary | ICD-10-CM | POA: Diagnosis not present

## 2023-06-26 DIAGNOSIS — D849 Immunodeficiency, unspecified: Secondary | ICD-10-CM | POA: Diagnosis not present

## 2023-06-26 DIAGNOSIS — Z01810 Encounter for preprocedural cardiovascular examination: Secondary | ICD-10-CM | POA: Diagnosis not present

## 2023-06-26 DIAGNOSIS — I5032 Chronic diastolic (congestive) heart failure: Secondary | ICD-10-CM | POA: Diagnosis not present

## 2023-06-26 DIAGNOSIS — I255 Ischemic cardiomyopathy: Secondary | ICD-10-CM | POA: Diagnosis not present

## 2023-06-30 ENCOUNTER — Telehealth (HOSPITAL_COMMUNITY): Payer: Self-pay

## 2023-06-30 NOTE — Telephone Encounter (Signed)
 Pt insurance is active and benefits verified through [facility]. Co-pay $0.00, DED $0.00/$0.00 met, out of pocket $3,900.00/$711.83 met, co-insurance 0%. No pre-authorization required. Passport, 06/30/23 @ 4:26PM, REF#[id number]   How many CR sessions are covered? (36 visits for TCR, 72 visits for ICR)72 Is this a lifetime maximum or an annual maximum? Annual Has the member used any of these services to date? No Is there a time limit (weeks/months) on start of program and/or program completion? No     Will contact patient to see if he is interested in the Cardiac Rehab Program.

## 2023-06-30 NOTE — Telephone Encounter (Signed)
 Called and spoke with pt William [name] who stated pt is interested.  Explained scheduling process and went over insurance, patient verbalized understanding.      Will pass to RN for review.

## 2023-06-30 NOTE — Telephone Encounter (Signed)
 Outside/paper referral received by Dr. Frosty Jews from [facility]. Will fax over Physician order and request further documents. Insurance benefits and eligibility to be determined.

## 2023-07-05 DIAGNOSIS — D849 Immunodeficiency, unspecified: Secondary | ICD-10-CM | POA: Diagnosis not present

## 2023-07-05 DIAGNOSIS — Z944 Liver transplant status: Secondary | ICD-10-CM | POA: Diagnosis not present

## 2023-07-05 LAB — HEPATIC FUNCTION PANEL
ALT: 11 U/L (ref 10–40)
AST: 11 — AB (ref 14–40)
Alkaline Phosphatase: 102 (ref 25–125)
Bilirubin, Total: 0.7

## 2023-07-05 LAB — BASIC METABOLIC PANEL WITH GFR
BUN: 10 (ref 4–21)
CO2: 23 — AB (ref 13–22)
Chloride: 96 — AB (ref 99–108)
Creatinine: 1.2 (ref 0.6–1.3)
Glucose: 109
Potassium: 5.5 meq/L — AB (ref 3.5–5.1)
Sodium: 136 — AB (ref 137–147)

## 2023-07-05 LAB — COMPREHENSIVE METABOLIC PANEL WITH GFR
Albumin: 4.5 (ref 3.5–5.0)
Calcium: 9.7 (ref 8.7–10.7)
Globulin: 2.4
eGFR: 66

## 2023-07-05 LAB — CBC AND DIFFERENTIAL
HCT: 46 (ref 41–53)
Hemoglobin: 13.7 (ref 13.5–17.5)
Neutrophils Absolute: 9.9
Platelets: 367 10*3/uL (ref 150–400)
WBC: 13.3

## 2023-07-05 LAB — CBC: RBC: 5.44 — AB (ref 3.87–5.11)

## 2023-07-06 ENCOUNTER — Encounter: Payer: Self-pay | Admitting: Family Medicine

## 2023-07-06 ENCOUNTER — Ambulatory Visit: Admitting: Family Medicine

## 2023-07-06 VITALS — BP 139/79 | HR 67 | Ht 69.0 in | Wt 210.8 lb

## 2023-07-06 DIAGNOSIS — Z952 Presence of prosthetic heart valve: Secondary | ICD-10-CM | POA: Diagnosis not present

## 2023-07-06 DIAGNOSIS — J9611 Chronic respiratory failure with hypoxia: Secondary | ICD-10-CM | POA: Diagnosis not present

## 2023-07-06 DIAGNOSIS — E1121 Type 2 diabetes mellitus with diabetic nephropathy: Secondary | ICD-10-CM

## 2023-07-06 DIAGNOSIS — D849 Immunodeficiency, unspecified: Secondary | ICD-10-CM

## 2023-07-06 DIAGNOSIS — Z7984 Long term (current) use of oral hypoglycemic drugs: Secondary | ICD-10-CM

## 2023-07-06 DIAGNOSIS — I10 Essential (primary) hypertension: Secondary | ICD-10-CM

## 2023-07-06 DIAGNOSIS — Z944 Liver transplant status: Secondary | ICD-10-CM | POA: Diagnosis not present

## 2023-07-06 NOTE — Progress Notes (Signed)
 07/06/2023  CC:  Chief Complaint  Patient presents with   Hospitalization Follow-up    Aortic valve replacement on 5/20; advised to f/u PCP 2 weeks    Patient is a 73 y.o. male who presents accompanied by his wife [facility] hospital follow up, specifically [facility] face-to-face visit. Dates hospitalized: 5/20 to 06/28/2023. Days since d/c from hospital: 8 days Patient was discharged from hospital to home Reason for admission to hospital: Severe aortic stenosis -->TAVR Date of interactive (phone) contact with patient and/or caregiver: 06/29/2023  I have reviewed patient's discharge summary plus pertinent specific notes, labs, and imaging from the hospitalization.  Patient was having progressive symptoms of his severe aortic stenosis.  On 06/27/2023 he had transcatheter aortic valve procedure placed via open femoral approach.  In the postoperative period his echo showed EF greater than 55%, no AI, no paravalvular leak, and a mean AV gradient of 4 mmHg. In the hospital he had acute on chronic respiratory failure with hypoxia.  He required oxygen  supplementation and diuresis.  At discharge patient was on 2 to 3 L of oxygen  at rest and 8 L of oxygen  with activity.  Currently:  Jartavious is feeling pretty well.  Since having the surgery he does not have any discomfort in his chest anymore and his energy level is improved some.  His breathing is improved some but not completely. He will be starting cardiac rehab in the near future. He is having bowel movements normally and emptying his bladder well.  His appetite is good, hydrating well. He has no dizziness, palpitations, diaphoresis, nausea, or lower extremity swelling.   Medication reconciliation was done today and patient is taking meds as recommended by discharging hospitalist/specialist.   Discharge medications: MiraLAX, 1 packet daily, Senokot 1 tab twice daily, triamcinolone  cream, amlodipine  10 mg a day, aspirin 81 mg a day,  metformin  500 mg twice daily, Lopressor  100 mg twice daily, mycophenolate  250 mg capsule take 2 caps in AM and 1 In the PM, Afrin nasal spray, Protonix  40 mg daily, rosuvastatin  10 mg daily, tacrolimus  1 mg capsule take 2 capsules every morning and 1 capsule every afternoon, Tylenol  750 mg every 8 hours as needed,acitretin  10 mg every day, furosemide  20 mg daily, magnesium oxide 400 mg daily.  PMH:  Past Medical History:  Diagnosis Date   AAA (abdominal aortic aneurysm) (HCC) 03/2014   repaired 2020   Ascending aortic aneurysm (HCC)    unrepaired. Stable at 4.3 cm on imaging by [facility] thoracic surg 10/2019 and 10/2020.   Bilateral renal cysts    simple (03/2014 MRI)   CAD (coronary artery disease)    Cholelithiases 2018   asymptomatic   Chronic diastolic heart failure (HCC)    Chronic renal insufficiency, stage 3 (moderate) (HCC) 2022   baseline sCr 1.3-1.5 (GFR 55)   COVID-19 virus infection 02/2020   sotrovimab  infusion   Diabetes mellitus with complication (HCC) 07/2014   A1c 6.8%. A1c 5.4% 11/2019   Diverticulosis    Gout    initially dx'd by diag arthrocentesis of elbow effusion in winter 2021, 3 episodes, all came after being started on chlorthalidone ->I d/c'd chlorthal.   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  Improving + off oxygen  as of 3 mo s/p liver transplant.  Doing well/resolved as of 02/2020 [facility] pulm f/u->they'll continue to follow. No worsening on CT [facility] 03/2023   History of blood transfusion 2018 X 4 dates   Chronic GI blood loss of unknown location  despite full GI endoscopic eval, etc   History of liver transplant (HCC) 07/2019   [facility]   Hyperkalemia    Lokelma    Hyperlipemia, mixed    elevated LFTs when on statins.     Hypertension    Cr bump 04/01/16 so I changed benicar -hct to benicar  plain and added amlodipine  5 mg.   Increased prostate specific antigen (PSA) velocity 2021   0.11 to 4.77 from 2020 to 2021 (he got a liver transplant and was put on  antirejection meds between these psa checks).   Iron  deficiency anemia 05/2016   Acute blood loss anemia: hospitalized, required transfusion x 3 U: colonoscopy and capsule study unrevealing.  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  W/u for ? hemolytic anemia to be pursued by hematologist as outpt.  Dr. Lon Risser, GI at [facility] following, too---he rec'd onc do bone marrow bx as of Jan 2019   Iron  deficiency anemia due to chronic blood loss 2018/19   GI: transfusions x >20 required; multiple endoscopies and bleeding scans unrevealing. Lysteda  and octreotide  + monthly iron  infusions as of 04/2017. Iron  infusions changed to every other month as of 09/2018 hem f/u.   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  Changes noted on all CXRs in 2018/2019.  Liver Transplant eval 03/2018->mild restriction on PFTs but no obstruction.  See further details in PMH "pulm fibrosis" section   Microscopic hematuria    Eval unremarkable by Dr. Rosalba Collier.   Nonmelanoma skin cancer 08/15/2018   2020 BCC nose, excised.  2022 R side of neck->mohs   Obesity    Pulmonary fibrosis (HCC)    PFTs: restrictive lung dz ([facility] transplant eval 05/2018). [facility] pulm eval felt this was likely hepatopulmonary syndrome->causing his hypoxia->low likelihood of progression (as of 10/04/18 transplant clinic f/u). Pulm rehab helping as of 2020/2021. OFF oxygen  as of 10/2019 transp f/u.   Pulmonary fibrosis (HCC)    Gradual worsening of objective testing 2020 05/2023, has had return of oxygen  requirement, "diffuse parenchymal lung disease of unclear  etiology".   Spleen enlarged     PSH:  Past Surgical History:  Procedure Laterality Date   ABDOMINAL AORTIC ANEURYSM REPAIR  08/23/2018   [facility]   CARDIAC CATHETERIZATION     05/2023 severe 3 vessel obstructive CAD, 1 of 3 bypass grafts patent.  Mod AS   Cardiac CT     04/07/23 OCCLUDED SAPHENOUS VEIN GRAFTS   CARDIOVASCULAR STRESS TEST  01/17/2012; 05/04/16   Normal stress nuclear study x 2 (2018--Normal perfusion. LVEF 66% with normal wall motion. This is a low risk study).   CERVICAL DISCECTOMY  1992   COLONOSCOPY N/A 05/27/2016   No site/explanation for blood loss found.  Erythematous mucosa in cecum and ascending colon--Cecal bx: normal.  Procedure: COLONOSCOPY;  Surgeon: Alvis Jourdain, MD;  Location: [facility] ENDOSCOPY;  Service: Endoscopy;  Laterality: N/A;   COLONOSCOPY     01/07/22 diverticulosis but NO POLYPS.  No further recommended   COLONOSCOPY W/ POLYPECTOMY  09/10/2014   Polypectomy x 2: recall 5 yrs (Dr. Tova Fresh).   COLONOSCOPY WITH  PROPOFOL  N/A 01/07/2022   NO FURTHER SCREENING INDICATED.  Procedure: COLONOSCOPY WITH PROPOFOL ;  Surgeon: Alvis Jourdain, MD;  Location: [facility] ENDOSCOPY;  Service: Gastroenterology;  Laterality: N/A;   CORONARY ANGIOPLASTY     CORONARY ARTERY BYPASS GRAFT  03/2006   CT ABDOMEN WITH CONTRAST ([facility] HX)  11/2018   CT CHEST WWO CONTAST ([facility] HX)  07/11/2019   No significant change in interstitial lung disease with possible underlying hepatopulmonary syndrome, Increased ascites in the setting of cirrhosis, tiny new right basilar nodule   DEXA  03/07/2019   T score 0.2/NORMAL   ENTEROSCOPY N/A 07/31/2016   Procedure: ENTEROSCOPY;  Surgeon: Asencion Blacksmith, MD;  Location: [facility] ENDOSCOPY;  Service: Endoscopy;  Laterality: N/A;   ENTEROSCOPY N/A 12/02/2016   Procedure: ENTEROSCOPY;  Surgeon: Alvis Jourdain, MD;  Location: [facility] ENDOSCOPY;  Service: Endoscopy;  Laterality: N/A;   ESOPHAGOGASTRODUODENOSCOPY N/A 05/25/2016   No site/explanation for blood loss found.   Procedure: ESOPHAGOGASTRODUODENOSCOPY (EGD);  Surgeon: Tami Falcon, MD;  Location: [facility] ENDOSCOPY;  Service: Endoscopy;  Laterality: N/A;   ESOPHAGOGASTRODUODENOSCOPY N/A 09/23/2016   Procedure: ESOPHAGOGASTRODUODENOSCOPY (EGD);  Surgeon: Alvis Jourdain, MD;  Location: [facility] ENDOSCOPY;  Service: Endoscopy;  Laterality: N/A;   ESOPHAGOGASTRODUODENOSCOPY (EGD) WITH PROPOFOL  N/A 08/21/2018   Procedure: ESOPHAGOGASTRODUODENOSCOPY (EGD) WITH PROPOFOL ;  Surgeon: Alvis Jourdain, MD;  Location: [facility] ENDOSCOPY;  Service: Endoscopy;  Laterality: N/A;   GIVENS CAPSULE STUDY N/A 05/27/2016   No source identified.  Repeat 10/2016--results pending.  Procedure: GIVENS CAPSULE STUDY;  Surgeon: Alvis Jourdain, MD;  Location: [facility] ENDOSCOPY;  Service: Endoscopy;  Laterality: N/A;   GIVENS CAPSULE STUDY N/A 10/15/2016   Procedure: GIVENS CAPSULE STUDY;  Surgeon: Alvis Jourdain, MD;  Location: [facility] ENDOSCOPY;  Service: Endoscopy;  Laterality: N/A;   GIVENS CAPSULE STUDY N/A 02/13/2017   Procedure: GIVENS CAPSULE STUDY;  Surgeon: Alvis Jourdain, MD;  Location: [facility] ENDOSCOPY;  Service: Endoscopy;  Laterality: N/A;   LIVER TRANSPLANT  08/02/2019   For NASH cirrhosis. [facility]   NECK SURGERY  1991   NM GI BLOOD LOSS  01/27/2017   NEGATIVE   SMALL BOWEL ENTEROSCOPY  10/2016   ([facility], Dr. Lon Risser: Double balloon enteroscopy--to the level of proximal ileum) jejunal polyps- which were removed--not bleeding & benign.  No source of bleeding was identified.   TRANSTHORACIC ECHOCARDIOGRAM  05/25/2016; 10/2017   05/2016: EF 60%, normal wall motion, grd II DD, mild aortic stenosis, dilated aortic root and ascending aorta. 10/2017: Mild LVH; no mention made of LV function.  Moderate aorticstenosis with mean gradient 19 peak gradient 41. AVA 1.1 cm^2.  05/2018 EF 55%,4.3 cm ascend aneur, mild AS. 10/2020 EF 60-65%, normal diast fxn, mod AV sclerosis w/out sten/regurg.   TRANSTHORACIC ECHOCARDIOGRAM     10/27/22; normal LV and RV fxn,+DD, mild LVH, mod AS,  otherwise valves fine.  No change 03/2023.   VASECTOMY  1977    MEDS:  Outpatient Medications Prior to Visit  Medication Sig Dispense Refill   acitretin  (SORIATANE ) 10 MG capsule Take 1 capsule by mouth daily 30 capsule 5   amLODipine  (NORVASC ) 10 MG tablet Take 1 tablet (10 mg total) by mouth daily. 90 tablet 3   ammonium lactate  (AMLACTIN) 12 % cream Apply 2 times daily to forearms and hands in preparation for treatment with Efudex  (fluorouracil ) 385 g 99   aspirin EC 81 MG tablet Take 81 mg by mouth daily. Swallow whole.     furosemide  (LASIX ) 20 MG tablet Take 1 tablet (20 mg total) by mouth  daily. 90 tablet 3   Magnesium 400 MG TABS Take 400 mg by mouth daily.     metFORMIN  (GLUCOPHAGE ) 500 MG tablet Take 1 tablet (500 mg total) by mouth 2 (two) times daily with a meal. OFFICE VISIT NEEDED FOR FURTHER REFILLS 60 tablet 0   metoprolol  tartrate (LOPRESSOR ) 100 MG tablet Take 1 tablet (100 mg total) by mouth 2 (two) times daily. 90 tablet 1   mycophenolate  (CELLCEPT ) 250 MG capsule Take 2 capsules by mouth in the morning and 1 capsule in the evening. 270 capsule 3   pantoprazole  (PROTONIX ) 40 MG tablet Take 1 tablet (40 mg total) by mouth daily. 90 tablet 3   rosuvastatin  (CRESTOR ) 10 MG tablet Take 1 tablet (10 mg total) by mouth daily. 90 tablet 3   tacrolimus  (PROGRAF ) 1 MG capsule Take 2 capsules (2 mg total) by mouth every morning AND 1 capsule (1 mg total) every evening. 270 capsule 3   triamcinolone  cream (KENALOG ) 0.1 % Apply 2 times a day to rash. Follow with CeraVe Cream.  Stop when smooth. 80 g 2   Facility-Administered Medications Prior to Visit  Medication Dose Route Frequency Provider Last Rate Last Admin   heparin  lock flush 100 unit/mL  500 Units Intracatheter Daily PRN Sonja Stockport, MD       sodium chloride  flush (NS) 0.9 % injection 10 mL  10 mL Intracatheter PRN Sonja Whitmer, MD        Physical Exam     07/06/2023   10:11 AM 01/26/2023    9:24 AM 01/26/2023    9:17 AM   Vitals with BMI  Height 5\' 9"   5\' 9"   Weight 210 lbs 13 oz  228 lbs 13 oz  BMI 31.12  33.77  Systolic 139 130 956  Diastolic 79 80 82  Pulse 67  73  02 sat 90% on 2L Dental: Alert and well-appearing. OZH:YQMV: no injection, icteris, swelling, or exudate.  EOMI, PERRLA. Mouth: lips without lesion/swelling.  Oral mucosa pink and moist. Oropharynx without erythema, exudate, or swelling.  CV: RRR, no m/r/g.   LUNGS: He has soft inspiratory crackles in posterior lung fields on both sides.  These are most prominent at the bases and gradually cleared up towards the apex.  Nonlabored resps, good aeration in all lung fields. ABD: soft, NT EXT: no clubbing or cyanosis.  no edema.   Pertinent labs/imaging Labs on day of discharge showed sodium 136, potassium 4.4, chloride 104, CO2 23, BUN 12, creatinine 1.3, glucose 131, calcium  8.9. White blood cell 13.8, hemoglobin 13.1, platelets 230. Lab Results  Component Value Date   HGBA1C 5.6 01/26/2023   HGBA1C 5.6 01/26/2023   HGBA1C 5.6 (A) 01/26/2023   HGBA1C 5.6 01/26/2023   ASSESSMENT/PLAN:  #1 TAVR for severe aortic stenosis. Doing well in the postoperative state. His pulmonary fibrosis remains an issue and he does still require oxygen .  Hopefully will see this improve with the cardiac rehab.  He does get regular follow-up with pulmonology. The etiology of his pulmonary fibrosis remains unclear--> continue oxygen  to keep O2 sat greater than 90%.  #2 hypertension, well-controlled on Lopressor  100 mg twice daily and amlodipine  10 mg a day. Serum creatinine yesterday 1.17, GFR 66.  Potassium mildly elevated at 5.5, which is about his baseline.  #3 history of liver transplant. Transaminases and alkaline phosphatase normal on yesterday's labs with the [facility] liver transplant clinic. Continue tacrolimus  and mycophenolate , see HPI for D/C med list for details.  4.  Diabetes  with nephropathy. Well-controlled on metformin  500 mg twice daily. This  has been very well-controlled.  We will repeat hemoglobin A1c when I see him back in 6 months.  Medical decision making of moderate complexity was utilized today.  FOLLOW UP: 6 months  Signed:  Arletha Lady, MD           07/06/2023

## 2023-07-17 DIAGNOSIS — I7143 Infrarenal abdominal aortic aneurysm, without rupture: Secondary | ICD-10-CM | POA: Diagnosis not present

## 2023-07-17 DIAGNOSIS — Z95828 Presence of other vascular implants and grafts: Secondary | ICD-10-CM | POA: Diagnosis not present

## 2023-07-19 ENCOUNTER — Ambulatory Visit: Admitting: Family Medicine

## 2023-07-19 DIAGNOSIS — Z944 Liver transplant status: Secondary | ICD-10-CM | POA: Diagnosis not present

## 2023-07-19 DIAGNOSIS — D849 Immunodeficiency, unspecified: Secondary | ICD-10-CM | POA: Diagnosis not present

## 2023-07-24 ENCOUNTER — Other Ambulatory Visit (HOSPITAL_COMMUNITY): Payer: Self-pay

## 2023-07-24 ENCOUNTER — Other Ambulatory Visit: Payer: Self-pay | Admitting: Pharmacist

## 2023-07-24 ENCOUNTER — Encounter: Payer: Self-pay | Admitting: Pharmacist

## 2023-07-24 MED ORDER — METFORMIN HCL 500 MG PO TABS
500.0000 mg | ORAL_TABLET | Freq: Two times a day (BID) | ORAL | 3 refills | Status: DC
  Filled 2023-07-24: qty 180, 90d supply, fill #0
  Filled 2024-01-01: qty 180, 90d supply, fill #1

## 2023-07-24 NOTE — Progress Notes (Signed)
 Pharmacy Quality Measure Review  This patient is appearing on a report for being at risk of failing the adherence measure for diabetes medications this calendar year.   Medication: metformin  500mg  Last fill date: 05/12/2023 for 30 day supply Patient has in hospital ffor 2 days for TAVR at [facility]. His wife also reports that he was instructed to hold metformin  for a few days prior to and after some tests in the last 2 months.  She endorses Mr. Stocking is still taking metformin  twice a day every day.   Reviewed medication indication, dosing, and goals of therapy.  Sent request for refills for 90 day supply to provider.   Cecilie Coffee, PharmD Clinical Pharmacist [facility]  Population Health [phone]

## 2023-07-25 DIAGNOSIS — I440 Atrioventricular block, first degree: Secondary | ICD-10-CM | POA: Diagnosis not present

## 2023-07-25 DIAGNOSIS — R918 Other nonspecific abnormal finding of lung field: Secondary | ICD-10-CM | POA: Diagnosis not present

## 2023-07-25 DIAGNOSIS — Z952 Presence of prosthetic heart valve: Secondary | ICD-10-CM | POA: Diagnosis not present

## 2023-07-25 DIAGNOSIS — Z48812 Encounter for surgical aftercare following surgery on the circulatory system: Secondary | ICD-10-CM | POA: Diagnosis not present

## 2023-07-25 DIAGNOSIS — R9431 Abnormal electrocardiogram [ECG] [EKG]: Secondary | ICD-10-CM | POA: Diagnosis not present

## 2023-07-25 DIAGNOSIS — I447 Left bundle-branch block, unspecified: Secondary | ICD-10-CM | POA: Diagnosis not present

## 2023-07-25 DIAGNOSIS — Z87891 Personal history of nicotine dependence: Secondary | ICD-10-CM | POA: Diagnosis not present

## 2023-08-02 ENCOUNTER — Ambulatory Visit: Payer: Medicare Other | Admitting: *Deleted

## 2023-08-02 VITALS — Ht 69.0 in | Wt 210.0 lb

## 2023-08-02 DIAGNOSIS — Z0000 Encounter for general adult medical examination without abnormal findings: Secondary | ICD-10-CM

## 2023-08-02 NOTE — Progress Notes (Signed)
 Subjective:   William [name] is a 73 y.o. male who presents for Medicare Annual/Subsequent preventive examination.  Visit Complete: Virtual I connected with  William [name] on 08/02/23 by a audio enabled telemedicine application and verified that I am speaking with the correct person using two identifiers.  Patient Location: Home  Provider Location: Home Office  I discussed the limitations of evaluation and management by telemedicine. The patient expressed understanding and agreed to proceed.  Vital Signs: Because this visit was a virtual/telehealth visit, some criteria may be missing or patient reported. Any vitals not documented were not able to be obtained and vitals that have been documented are patient reported.  Patient Medicare AWV questionnaire was completed by the patient on 07-31-2023; I have confirmed that all information answered by patient is correct and no changes since this date.  Cardiac Risk Factors include: advanced age (>63men, >61 women);diabetes mellitus;male gender;obesity (BMI >30kg/m2);hypertension     Objective:    Today's Vitals   08/02/23 1318  Weight: 210 lb (95.3 kg)  Height: 5' 9 (1.753 m)   Body mass index is 31.01 kg/m.     08/02/2023    1:15 PM 07/27/2022    1:39 PM 01/07/2022    7:03 AM 07/21/2021    1:44 PM 08/28/2019   10:46 AM 06/27/2019    9:55 AM 03/07/2019    9:47 AM  Advanced Directives  Does Patient Have a Medical Advance Directive? Yes Yes No Yes Yes No No  Type of Estate agent of [employer] Power of [place];Living will  Healthcare Power of [employer] of [place];Living will    Does patient want to make changes to medical advance directive?     No - Patient declined    Copy of Healthcare Power of Attorney in Chart? No - copy requested No - copy requested  No - copy requested     Would patient like information on creating a medical advance directive?   No - Patient declined   No - Patient  declined No - Patient declined    Current Medications (verified) Outpatient Encounter Medications as of 08/02/2023  Medication Sig   acitretin  (SORIATANE ) 10 MG capsule Take 1 capsule by mouth daily   amLODipine  (NORVASC ) 10 MG tablet Take 1 tablet (10 mg total) by mouth daily.   ammonium lactate  (AMLACTIN) 12 % cream Apply 2 times daily to forearms and hands in preparation for treatment with Efudex  (fluorouracil )   aspirin EC 81 MG tablet Take 81 mg by mouth daily. Swallow whole.   furosemide  (LASIX ) 20 MG tablet Take 1 tablet (20 mg total) by mouth daily.   Magnesium 400 MG TABS Take 400 mg by mouth daily.   metFORMIN  (GLUCOPHAGE ) 500 MG tablet Take 1 tablet (500 mg total) by mouth 2 (two) times daily with a meal.   metoprolol  tartrate (LOPRESSOR ) 100 MG tablet Take 1 tablet (100 mg total) by mouth 2 (two) times daily.   mycophenolate  (CELLCEPT ) 250 MG capsule Take 2 capsules by mouth in the morning and 1 capsule in the evening.   pantoprazole  (PROTONIX ) 40 MG tablet Take 1 tablet (40 mg total) by mouth daily.   rosuvastatin  (CRESTOR ) 10 MG tablet Take 1 tablet (10 mg total) by mouth daily.   tacrolimus  (PROGRAF ) 1 MG capsule Take 2 capsules (2 mg total) by mouth every morning AND 1 capsule (1 mg total) every evening.   triamcinolone  cream (KENALOG ) 0.1 % Apply 2 times a day to rash.  Follow with CeraVe Cream.  Stop when smooth.   Facility-Administered Encounter Medications as of 08/02/2023  Medication   heparin  lock flush 100 unit/mL   sodium chloride  flush (NS) 0.9 % injection 10 mL    Allergies (verified) Niacinamide, Thiazide-type diuretics, and Quinolones   History: Past Medical History:  Diagnosis Date   AAA (abdominal aortic aneurysm) (HCC) 03/2014   repaired 2020   Ascending aortic aneurysm (HCC)    unrepaired. Stable at 4.3 cm on imaging by [facility] thoracic surg 10/2019 and 10/2020.   Bilateral renal cysts    simple (03/2014 MRI)   CAD (coronary artery disease)     Cholelithiases 2018   asymptomatic   Chronic diastolic heart failure (HCC)    Chronic renal insufficiency, stage 3 (moderate) (HCC) 2022   baseline sCr 1.3-1.5 (GFR 55)   COVID-19 virus infection 02/2020   sotrovimab  infusion   Diabetes mellitus with complication (HCC) 07/2014   A1c 6.8%. A1c 5.4% 11/2019   Diverticulosis    Gout    initially dx'd by diag arthrocentesis of elbow effusion in winter 2021, 3 episodes, all came after being started on chlorthalidone ->I d/c'd chlorthal.   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  Improving + off oxygen  as of 3 mo s/p liver transplant.  Doing well/resolved as of 02/2020 [facility] pulm f/u->they'll continue to follow. No worsening on CT imaging at [facility] 03/2023   History of blood transfusion 2018 X 4 dates   Chronic GI blood loss of unknown location despite full GI endoscopic eval, etc   History of liver transplant (HCC) 07/2019   [facility]   Hyperkalemia    Lokelma    Hyperlipemia, mixed    elevated LFTs when on statins.     Hypertension    Cr bump 04/01/16 so I changed benicar -hct to benicar  plain and added amlodipine  5 mg.   Increased prostate specific antigen (PSA) velocity 2021   0.11 to 4.77 from 2020 to 2021 (he got a liver transplant and was put on antirejection meds between these psa checks).   Iron  deficiency anemia 05/2016   Acute blood loss anemia: hospitalized, required transfusion x 3 U: colonoscopy and capsule study unrevealing.  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  W/u for ? hemolytic anemia to be pursued by hematologist as outpt.  Dr. Charlean, GI at [facility] following, too---he rec'd onc do bone marrow bx as of Jan 2019   Iron  deficiency anemia due to chronic blood loss 2018/19   GI: transfusions x >20 required; multiple endoscopies and bleeding scans unrevealing. Lysteda  and octreotide  + monthly iron  infusions as of 04/2017. Iron  infusions changed to  every other month as of 09/2018 hem f/u.   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  Changes noted on all CXRs in 2018/2019.  Liver Transplant eval 03/2018->mild restriction on PFTs but no obstruction.  See further details in [facility] pulm fibrosis section   Microscopic hematuria    Eval unremarkable by Dr. Maryanne.   Nonmelanoma skin cancer 08/15/2018   2020 BCC nose, excised.  2022 R side of neck->mohs   Obesity  Pulmonary fibrosis (HCC)    PFTs: restrictive lung dz ([facility] Liver transplant eval 05/2018). [facility] pulm eval felt this was likely hepatopulmonary syndrome->causing his hypoxia->low likelihood of progression (as of 10/04/18 transplant clinic f/u). Pulm rehab helping as of 2020/2021. OFF oxygen  as of 10/2019 transp f/u.   Pulmonary fibrosis (HCC)    Gradual worsening of objective testing 2020 05/2023, has had return of oxygen  requirement, diffuse parenchymal lung disease of unclear etiology.   Spleen enlarged    Past Surgical History:  Procedure Laterality Date   ABDOMINAL AORTIC ANEURYSM REPAIR  08/23/2018   [facility]   CARDIAC CATHETERIZATION     05/2023 severe 3 vessel obstructive CAD, 1 of 3 bypass grafts patent.  Mod AS   Cardiac CT     04/07/23 OCCLUDED SAPHENOUS VEIN GRAFTS   CARDIOVASCULAR STRESS TEST  01/17/2012; 05/04/16   Normal stress nuclear study x 2 (2018--Normal perfusion. LVEF 66% with normal wall motion. This is a low risk study).   CERVICAL DISCECTOMY  1992   COLONOSCOPY N/A 05/27/2016   No site/explanation for blood loss found.  Erythematous mucosa in cecum and ascending colon--Cecal bx: normal.  Procedure: COLONOSCOPY;  Surgeon: Belvie Just, MD;  Location: [facility]  ENDOSCOPY;  Service: Endoscopy;  Laterality: N/A;   COLONOSCOPY     01/07/22 diverticulosis but NO POLYPS.  No further recommended   COLONOSCOPY W/ POLYPECTOMY  09/10/2014   Polypectomy x 2: recall 5 yrs (Dr. Kristie).   COLONOSCOPY WITH PROPOFOL  N/A 01/07/2022   NO FURTHER SCREENING INDICATED.  Procedure: COLONOSCOPY WITH PROPOFOL ;  Surgeon: Just Belvie, MD;  Location: [facility] ENDOSCOPY;  Service: Gastroenterology;  Laterality: N/A;   CORONARY ANGIOPLASTY     CORONARY ARTERY BYPASS GRAFT  03/2006   CT ABDOMEN WITH CONTRAST ([facility] HX)  11/2018   CT CHEST WWO CONTAST ([facility] HX)  07/11/2019   No significant change in interstitial lung disease with possible underlying hepatopulmonary syndrome, Increased ascites in the setting of cirrhosis, tiny new right basilar nodule   DEXA  03/07/2019   T score 0.2/NORMAL   ENTEROSCOPY N/A 07/31/2016   Procedure: ENTEROSCOPY;  Surgeon: Aneita Gwendlyn DASEN, MD;  Location: [facility] ENDOSCOPY;  Service: Endoscopy;  Laterality: N/A;   ENTEROSCOPY N/A 12/02/2016   Procedure: ENTEROSCOPY;  Surgeon: Just Belvie, MD;  Location: [facility] ENDOSCOPY;  Service: Endoscopy;  Laterality: N/A;   ESOPHAGOGASTRODUODENOSCOPY N/A 05/25/2016   No site/explanation for blood loss found.  Procedure: ESOPHAGOGASTRODUODENOSCOPY (EGD);  Surgeon: Renaye Kristie, MD;  Location: [facility] ENDOSCOPY;  Service: Endoscopy;  Laterality: N/A;   ESOPHAGOGASTRODUODENOSCOPY N/A 09/23/2016   Procedure: ESOPHAGOGASTRODUODENOSCOPY (EGD);  Surgeon: Just Belvie, MD;  Location: [facility] ENDOSCOPY;  Service: Endoscopy;  Laterality: N/A;   ESOPHAGOGASTRODUODENOSCOPY (EGD) WITH PROPOFOL  N/A 08/21/2018   Procedure: ESOPHAGOGASTRODUODENOSCOPY (EGD) WITH PROPOFOL ;  Surgeon: Just Belvie, MD;  Location: [facility] ENDOSCOPY;  Service: Endoscopy;  Laterality: N/A;   GIVENS CAPSULE STUDY N/A 05/27/2016   No source identified.  Repeat 10/2016--results pending.  Procedure: GIVENS CAPSULE STUDY;  Surgeon: Belvie Just, MD;  Location: [facility] ENDOSCOPY;   Service: Endoscopy;  Laterality: N/A;   GIVENS CAPSULE STUDY N/A 10/15/2016   Procedure: GIVENS CAPSULE STUDY;  Surgeon: Just Belvie, MD;  Location: [facility] ENDOSCOPY;  Service: Endoscopy;  Laterality: N/A;   GIVENS CAPSULE STUDY N/A 02/13/2017   Procedure: GIVENS CAPSULE STUDY;  Surgeon: Just Belvie, MD;  Location: [facility] ENDOSCOPY;  Service: Endoscopy;  Laterality: N/A;   LIVER TRANSPLANT  08/02/2019   For NASH cirrhosis. [facility]   NECK SURGERY  1991  NM GI BLOOD LOSS  01/27/2017   NEGATIVE   SMALL BOWEL ENTEROSCOPY  10/2016   ([facility], Dr. Charlean: Double balloon enteroscopy--to the level of proximal ileum) jejunal polyps- which were removed--not bleeding & benign.  No source of bleeding was identified.   TRANSTHORACIC ECHOCARDIOGRAM  05/25/2016; 10/2017   05/2016: EF 60%, normal wall motion, grd II DD, mild aortic stenosis, dilated aortic root and ascending aorta. 10/2017: Mild LVH; no mention made of LV function.  Moderate aorticstenosis with mean gradient 19 peak gradient 41. AVA 1.1 cm^2.  05/2018 EF 55%,4.3 cm ascend aneur, mild AS. 10/2020 EF 60-65%, normal diast fxn, mod AV sclerosis w/out sten/regurg.   TRANSTHORACIC ECHOCARDIOGRAM     10/27/22; normal LV and RV fxn,+DD, mild LVH, mod AS, otherwise valves fine.  No change 03/2023.   VASECTOMY  1977   Family History  Problem Relation Age of Onset   Hypertension Mother    Cancer - Other Mother        liver cancer   Heart disease Father    Heart attack Father    Cancer - Lung Father    Diabetes Father    Liver disease Sister    Anemia Sister    Heart disease Sister    Heart attack Sister    Heart disease Brother        CABG 20 YEARS AGO   Hypertension Brother    Mesothelioma Brother        HALF-BROTHER   Cancer - Other Brother        CLL   Cancer - Lung Brother        Mets to brain   Cancer - Lung Sister        HALF-SISTER   Liver disease Brother    Heart disease Brother        CABG-2012   Emphysema Maternal Grandfather    Cancer -  Other Paternal Grandmother        Stomach   Heart attack Paternal Grandfather    Social History   Socioeconomic History   Marital status: Married    Spouse name: Not on file   Number of children: Not on file   Years of education: Not on file   Highest education level: 12th grade  Occupational History   Not on file  Tobacco Use   Smoking status: Former    Types: Cigarettes   Smokeless tobacco: Never   Tobacco comments:    QUIT I 1983  Vaping Use   Vaping status: Never Used  Substance and Sexual Activity   Alcohol use: No   Drug use: No   Sexual activity: Yes  Other Topics Concern   Not on file  Social History Narrative   Married, 3 grown children, 3 GCs.   Educ: 10th grade.   Occupation: Retired Surveyor, minerals.   Tob: quit 1983, smoked about 20 pack-yr hx prior.   No alcohol.   Social Drivers of Corporate investment banker Strain: Low Risk  (08/02/2023)   Overall Financial Resource Strain (CARDIA)    Difficulty of Paying Living Expenses: Not hard at all  Food Insecurity: No Food Insecurity (08/02/2023)   Hunger Vital Sign    Worried About Running Out of Food in the Last Year: Never true    Ran Out of Food in the Last Year: Never true  Transportation Needs: No Transportation Needs (07/31/2023)   PRAPARE - Transportation    Lack of Transportation (Medical): No    Lack of  Transportation (Non-Medical): No  Physical Activity: Inactive (08/02/2023)   Exercise Vital Sign    Days of Exercise per Week: 0 days    Minutes of Exercise per Session: 0 min  Stress: No Stress Concern Present (08/02/2023)   [employer] of Occupational Health - Occupational Stress Questionnaire    Feeling of Stress: Not at all  Social Connections: Socially Integrated (07/31/2023)   Social Connection and Isolation Panel    Frequency of Communication with Friends and Family: More than three times a week    Frequency of Social Gatherings with Friends and Family: Three times a week    Attends  Religious Services: 1 to 4 times per year    Active Member of Clubs or Organizations: Yes    Attends Engineer, structural: More than 4 times per year    Marital Status: Married    Tobacco Counseling Counseling given: Not Answered Tobacco comments: QUIT I 1983   Clinical Intake:  Pre-visit preparation completed: Yes  Pain : No/denies pain     Diabetes: Yes CBG done?: No Did pt. bring in CBG monitor from home?: No  How often do you need to have someone help you when you read instructions, pamphlets, or other written materials from your doctor or pharmacy?: 1 - Never  Interpreter Needed?: No  Information entered by :: Mliss Graff LPN   Activities of Daily Living    08/02/2023    1:17 PM 07/31/2023   11:20 AM  In your present state of health, do you have any difficulty performing the following activities:  Hearing? 0 0  Vision? 0 0  Difficulty concentrating or making decisions? 0 0  Walking or climbing stairs? 1 1  Dressing or bathing? 0 0  Doing errands, shopping? 0 0  Preparing Food and eating ? N N  Using the Toilet? N N  In the past six months, have you accidently leaked urine? N N  Do you have problems with loss of bowel control? N N  Managing your Medications? N N  Managing your Finances? N N  Housekeeping or managing your Housekeeping? N N    Patient Care Team: Candise Aleene DEL, MD as PCP - General (Family Medicine) Burnard Debby LABOR, MD as PCP - Cardiology (Cardiology) Matilda Senior, MD as Consulting Physician (Urology) Burnard Debby LABOR, MD as Consulting Physician (Cardiology) Onesimo Emaline Brink, MD as Consulting Physician (Hematology) Charlean, Toribio Elbe, MD as Consulting Physician (Gastroenterology) Rollin Dover, MD as Consulting Physician (Gastroenterology) Rege, Rosette Fanti, MD as Consulting Physician (General Surgery) Sherre Glatter, MD as Consulting Physician (Cardiothoracic Surgery) Blazing, Ozell LABOR, MD as Consulting Physician  (Cardiology) Myrna Shaver, MD as Consulting Physician (Internal Medicine) Yvone Rush, MD as Consulting Physician (Orthopedic Surgery) Bluford Dorn Holts, MD as Consulting Physician (Dermatology) Onesimo Emaline Brink, MD as Consulting Physician (Hematology)  Indicate any recent Medical Services you may have received from other than [facility] providers in the past year (date may be approximate).     Assessment:   This is a routine wellness examination for Kassem.  Hearing/Vision screen Hearing Screening - Comments:: Does not wear hearing aids Some trouble hearing Vision Screening - Comments:: Up to date My eye doctor   Goals Addressed             This Visit's Progress    Patient Stated   Not on track    Lose weight and start exercise      Patient Stated   Not on track    Lose  weight     Patient Stated       Would like to feel better       Depression Screen    08/02/2023    1:23 PM 07/06/2023   11:07 AM 07/27/2022    1:38 PM 07/21/2022    9:28 AM 02/14/2022    1:10 PM 01/13/2022    8:38 AM 07/21/2021    1:42 PM  PHQ 2/9 Scores  PHQ - 2 Score 0 0 0  0 0 0  PHQ- 9 Score 0    0    Exception Documentation    Patient refusal       Fall Risk    08/02/2023    1:16 PM 07/31/2023   11:20 AM 07/06/2023   11:07 AM 07/27/2022    9:15 AM 07/17/2022    9:13 PM  Fall Risk   Falls in the past year? 0 0 0 0 0  Number falls in past yr: 0  0    Injury with Fall? 0  0    Risk for fall due to :   No Fall Risks Impaired vision   Follow up Falls evaluation completed;Education provided;Falls prevention discussed  Falls evaluation completed Falls prevention discussed     MEDICARE RISK AT HOME: Medicare Risk at Home Any stairs in or around the home?: Yes If so, are there any without handrails?: No Home free of loose throw rugs in walkways, pet beds, electrical cords, etc?: Yes Adequate lighting in your home to reduce risk of falls?: Yes Life alert?: No Use of a cane, walker or w/c?:  No Grab bars in the bathroom?: Yes Shower chair or bench in shower?: Yes Elevated toilet seat or a handicapped toilet?: Yes  TIMED UP AND GO:  Was the test performed?  No    Cognitive Function:        08/02/2023    1:17 PM 07/27/2022    1:41 PM 07/21/2021    1:46 PM  6CIT Screen  What Year? 0 points 0 points 0 points  What month? 0 points 0 points 0 points  What time? 0 points 0 points 0 points  Count back from 20 0 points 0 points 0 points  Months in reverse 0 points 0 points 0 points  Repeat phrase 0 points 0 points 0 points  Total Score 0 points 0 points 0 points    Immunizations Immunization History  Administered Date(s) Administered   Fluad Quad(high Dose 65+) 10/08/2018, 11/25/2019, 10/14/2020, 12/16/2021   Fluad Trivalent(High Dose 65+) 12/02/2022   Hepatitis B, ADULT 05/16/2018, 07/26/2018, 11/29/2018, 05/21/2019   Hepatitis B, Dialysis 06/11/2019   Influenza, High Dose Seasonal PF 12/02/2015, 11/03/2016, 11/16/2017   Influenza-Unspecified 11/16/2017, 11/25/2019   Moderna Covid-19 Vaccine Bivalent Booster 10yrs & up 01/04/2021   Moderna SARS-COV2 Booster Vaccination 07/20/2020   Moderna Sars-Covid-2 Vaccination 03/08/2019, 04/05/2019, 10/24/2019   PNEUMOCOCCAL CONJUGATE-20 08/17/2022   Pfizer(Comirnaty )Fall Seasonal Vaccine 12 years and older 02/03/2022, 10/18/2022   Pneumococcal Conjugate-13 04/01/2016   Pneumococcal Polysaccharide-23 04/05/2017   Tdap 06/29/2016   Zoster Recombinant(Shingrix ) 10/06/2020, 12/11/2020    TDAP status: Up to date  Flu Vaccine status: Up to date  Pneumococcal vaccine status: Up to date  Covid-19 vaccine status: Information provided on how to obtain vaccines.   Qualifies for Shingles Vaccine? No   Zostavax completed Yes   Shingrix  Completed?: Yes  Screening Tests Health Maintenance  Topic Date Due   COVID-19 Vaccine (7 - Moderna risk 2024-25 season) 04/17/2023   HEMOGLOBIN A1C  07/27/2023   INFLUENZA VACCINE  09/08/2023    OPHTHALMOLOGY EXAM  01/09/2024   Diabetic kidney evaluation - Urine ACR  01/26/2024   FOOT EXAM  01/26/2024   Diabetic kidney evaluation - eGFR measurement  07/04/2024   Medicare Annual Wellness (AWV)  08/01/2024   DTaP/Tdap/Td (2 - Td or Tdap) 06/30/2026   Colonoscopy  01/08/2032   Pneumococcal Vaccine: 50+ Years  Completed   Hepatitis B Vaccines  Completed   Hepatitis C Screening  Completed   Zoster Vaccines- Shingrix   Completed   HPV VACCINES  Aged Out   Meningococcal B Vaccine  Aged Out    Health Maintenance  Health Maintenance Due  Topic Date Due   COVID-19 Vaccine (7 - Moderna risk 2024-25 season) 04/17/2023   HEMOGLOBIN A1C  07/27/2023    Colorectal cancer screening: No longer required.   Lung Cancer Screening: (Low Dose CT Chest recommended if Age 62-80 years, 20 pack-year currently smoking OR have quit w/in 15years.) does not qualify.   Lung Cancer Screening Referral:   Additional Screening:  Hepatitis C Screening: does not qualify; Completed 2018  Vision Screening: Recommended annual ophthalmology exams for early detection of glaucoma and other disorders of the eye. Is the patient up to date with their annual eye exam?  Yes  Who is the provider or what is the name of the office in which the patient attends annual eye exams? My eye doctor If pt is not established with a provider, would they like to be referred to a provider to establish care? No .   Dental Screening: Recommended annual dental exams for proper oral hygiene  Nutrition Risk Assessment:  Has the patient had any N/V/D within the last 2 months?  No  Does the patient have any non-healing wounds?  No  Has the patient had any unintentional weight loss or weight gain?  No   Diabetes:  Is the patient diabetic?  Yes  If diabetic, was a CBG obtained today?  No  Did the patient bring in their glucometer from home?  No  How often do you monitor your CBG's?  2 times a week.   Financial Strains and  Diabetes Management:  Are you having any financial strains with the device, your supplies or your medication? No .  Does the patient want to be seen by Chronic Care Management for management of their diabetes?  No  Would the patient like to be referred to a Nutritionist or for Diabetic Management?  No   Diabetic Exams:  Diabetic Eye Exam: . Pt has been advised about the importance in completing this exam.   Diabetic Foot Exam: . Pt has been advised about the importance in completing this exam..    Community Resource Referral / Chronic Care Management: CRR required this visit?  No   CCM required this visit?  No     Plan:     I have personally reviewed and noted the following in the patient's chart:   Medical and social history Use of alcohol, tobacco or illicit drugs  Current medications and supplements including opioid prescriptions. Patient is not currently taking opioid prescriptions. Functional ability and status Nutritional status Physical activity Advanced directives List of other physicians Hospitalizations, surgeries, and ER visits in previous 12 months Vitals Screenings to include cognitive, depression, and falls Referrals and appointments  In addition, I have reviewed and discussed with patient certain preventive protocols, quality metrics, and best practice recommendations. A written personalized care plan for preventive services as well  as general preventive health recommendations were provided to patient.     Mliss Graff, LPN   [id number]   After Visit Summary: (MyChart) Due to this being a telephonic visit, the after visit summary with patients personalized plan was offered to patient via MyChart   Nurse Notes:

## 2023-08-02 NOTE — Patient Instructions (Signed)
 [name] , Thank you for taking time to come for your Medicare Wellness Visit. I appreciate your ongoing commitment to your health goals. Please review the following plan we discussed and let me know if I can assist you in the future.   Screening recommendations/referrals: Colonoscopy: no longer required Recommended yearly ophthalmology/optometry visit for glaucoma screening and checkup Recommended yearly dental visit for hygiene and checkup  Vaccinations: Influenza vaccine: up to date Pneumococcal vaccine: up to date Tdap vaccine: up to date Shingles vaccine: up to date       Preventive Care 65 Years and Older, Male Preventive care refers to lifestyle choices and visits with your health care provider that can promote health and wellness. What does preventive care include? A yearly physical exam. This is also called an annual well check. Dental exams once or twice a year. Routine eye exams. Ask your health care provider how often you should have your eyes checked. Personal lifestyle choices, including: Daily care of your teeth and gums. Regular physical activity. Eating a healthy diet. Avoiding tobacco and drug use. Limiting alcohol use. Practicing safe sex. Taking low doses of aspirin every day. Taking vitamin and mineral supplements as recommended by your health care provider. What happens during an annual well check? The services and screenings done by your health care provider during your annual well check will depend on your age, overall health, lifestyle risk factors, and family history of disease. Counseling  Your health care provider may ask you questions about your: Alcohol use. Tobacco use. Drug use. Emotional well-being. Home and relationship well-being. Sexual activity. Eating habits. History of falls. Memory and ability to understand (cognition). Work and work Astronomer. Screening  You may have the following tests or measurements: Height, weight, and  BMI. Blood pressure. Lipid and cholesterol levels. These may be checked every 5 years, or more frequently if you are over 55 years old. Skin check. Lung cancer screening. You may have this screening every year starting at age 70 if you have a 30-pack-year history of smoking and currently smoke or have quit within the past 15 years. Fecal occult blood test (FOBT) of the stool. You may have this test every year starting at age 51. Flexible sigmoidoscopy or colonoscopy. You may have a sigmoidoscopy every 5 years or a colonoscopy every 10 years starting at age 31. Prostate cancer screening. Recommendations will vary depending on your family history and other risks. Hepatitis C blood test. Hepatitis B blood test. Sexually transmitted disease (STD) testing. Diabetes screening. This is done by checking your blood sugar (glucose) after you have not eaten for a while (fasting). You may have this done every 1-3 years. Abdominal aortic aneurysm (AAA) screening. You may need this if you are a current or former smoker. Osteoporosis. You may be screened starting at age 57 if you are at high risk. Talk with your health care provider about your test results, treatment options, and if necessary, the need for more tests. Vaccines  Your health care provider may recommend certain vaccines, such as: Influenza vaccine. This is recommended every year. Tetanus, diphtheria, and acellular pertussis (Tdap, Td) vaccine. You may need a Td booster every 10 years. Zoster vaccine. You may need this after age 75. Pneumococcal 13-valent conjugate (PCV13) vaccine. One dose is recommended after age 31. Pneumococcal polysaccharide (PPSV23) vaccine. One dose is recommended after age 31. Talk to your health care provider about which screenings and vaccines you need and how often you need them. This information is not  intended to replace advice given to you by your health care provider. Make sure you discuss any questions you have  with your health care provider. Document Released: 02/20/2015 Document Revised: 10/14/2015 Document Reviewed: 11/25/2014 Elsevier Interactive Patient Education  2017 [employer].  Fall Prevention in the Home Falls can cause injuries. They can happen to people of all ages. There are many things you can do to make your home safe and to help prevent falls. What can I do on the outside of my home? Regularly fix the edges of walkways and driveways and fix any cracks. Remove anything that might make you trip as you walk through a door, such as a raised step or threshold. Trim any bushes or trees on the path to your home. Use bright outdoor lighting. Clear any walking paths of anything that might make someone trip, such as rocks or tools. Regularly check to see if handrails are loose or broken. Make sure that both sides of any steps have handrails. Any raised decks and porches should have guardrails on the edges. Have any leaves, snow, or ice cleared regularly. Use sand or salt on walking paths during winter. Clean up any spills in your garage right away. This includes oil or grease spills. What can I do in the bathroom? Use night lights. Install grab bars by the toilet and in the tub and shower. Do not use towel bars as grab bars. Use non-skid mats or decals in the tub or shower. If you need to sit down in the shower, use a plastic, non-slip stool. Keep the floor dry. Clean up any water that spills on the floor as soon as it happens. Remove soap buildup in the tub or shower regularly. Attach bath mats securely with double-sided non-slip rug tape. Do not have throw rugs and other things on the floor that can make you trip. What can I do in the bedroom? Use night lights. Make sure that you have a light by your bed that is easy to reach. Do not use any sheets or blankets that are too big for your bed. They should not hang down onto the floor. Have a firm chair that has side arms. You can use  this for support while you get dressed. Do not have throw rugs and other things on the floor that can make you trip. What can I do in the kitchen? Clean up any spills right away. Avoid walking on wet floors. Keep items that you use a lot in easy-to-reach places. If you need to reach something above you, use a strong step stool that has a grab bar. Keep electrical cords out of the way. Do not use floor polish or wax that makes floors slippery. If you must use wax, use non-skid floor wax. Do not have throw rugs and other things on the floor that can make you trip. What can I do with my stairs? Do not leave any items on the stairs. Make sure that there are handrails on both sides of the stairs and use them. Fix handrails that are broken or loose. Make sure that handrails are as long as the stairways. Check any carpeting to make sure that it is firmly attached to the stairs. Fix any carpet that is loose or worn. Avoid having throw rugs at the top or bottom of the stairs. If you do have throw rugs, attach them to the floor with carpet tape. Make sure that you have a light switch at the top of the stairs  and the bottom of the stairs. If you do not have them, ask someone to add them for you. What else can I do to help prevent falls? Wear shoes that: Do not have high heels. Have rubber bottoms. Are comfortable and fit you well. Are closed at the toe. Do not wear sandals. If you use a stepladder: Make sure that it is fully opened. Do not climb a closed stepladder. Make sure that both sides of the stepladder are locked into place. Ask someone to hold it for you, if possible. Clearly mark and make sure that you can see: Any grab bars or handrails. First and last steps. Where the edge of each step is. Use tools that help you move around (mobility aids) if they are needed. These include: Canes. Walkers. Scooters. Crutches. Turn on the lights when you go into a dark area. Replace any light bulbs  as soon as they burn out. Set up your furniture so you have a clear path. Avoid moving your furniture around. If any of your floors are uneven, fix them. If there are any pets around you, be aware of where they are. Review your medicines with your doctor. Some medicines can make you feel dizzy. This can increase your chance of falling. Ask your doctor what other things that you can do to help prevent falls. This information is not intended to replace advice given to you by your health care provider. Make sure you discuss any questions you have with your health care provider. Document Released: 11/20/2008 Document Revised: 07/02/2015 Document Reviewed: 02/28/2014 Elsevier Interactive Patient Education  2017 [employer].

## 2023-08-06 NOTE — Progress Notes (Signed)
 OFFICE NOTE:    Date:  08/07/2023  ID:  William, [name] October 14, 1950, MRN [id number] PCP: William Aleene DEL, MD  [facility] Providers Cardiologist:  William Fell, MD       Patient Profile:  Coronary artery disease  S/p CABG in 2008 [facility]  05/04/2016: Normal perfusion, EF 66; low risk R/LHC 05/10/2023 ([facility]): mod pulm HTN, 1/3 grafts patent >> LAD ostial 80, proximal 100, D1 80; LCx ostial 50, proximal 80, distal 100; RI 80; RCA ostial 50, proximal 100; LIMA-LAD patent, SVG-RI 100, SVG-PDA 100; PCWP 22, mean PA 35  (HFpEF) heart failure with preserved ejection fraction  Aortic stenosis s/p TAVR ([facility]) 06/27/23 TTE 06/28/23 ([facility]) - post TAVR: EF > 55, NL RVSF, mild MR, trivial PI, mild TR, RVSP 53 mmHg, TAVR w mean 4 mmHg, no PVL Cardiac MRA 04/19/22 ([facility]): Ao root 4 cm, ascending aorta 3.8 cm Pulmonary hypertension  Obesity  Metabolic syndrome Chronic kidney disease Hyperkalemia >> ARB (Irbesartan ) stopped  Hypertension Hyperlipidemia  Anemia GI blood loss Cirrhosis  S/p Liver Tx at [facility] 2021 Abdominal aortic aneurysm  S/p EVAR at [facility] 2020 Ascending aortic aneurysm CT 04/2018: 3.9 cm MRA 10/2019 ([facility]): Aortic root 4 cm, ascending aorta 3.9 cm MRA 10/27/2020 ([facility]): Root 4 cm, ascending aorta 3.8 cm Pulmonary fibrosis Followed by Pulmonology at [facility]       Discussed the use of AI scribe software for clinical note transcription with the patient, who gave verbal consent to proceed. History of Present Illness William [name] is a 73 y.o. male who returns for follow up of AS s/p TAVR, CAD. He was last seen by Dr. Burnard in 12/204. Since then, he underwent TAVR for severe AS via TF approach with Dr. Vinita and Dr. Margrette 06/27/23 at [facility]. Post TAVR echocardiogram demonstrated well seated prosthesis with no PVL. Of note, pre procedure cardiac catheterization demonstrated 1/3 grafts patent. Report with anatomy is not available in Care Everywhere.  He is here with his  wife. Prior to his TAVR, he was having regular chest pain. This is resolved. He experiences persistent shortness of breath with minimal exertion, which has not improved since the TAVR. He was not on oxygen  until February 2025, and his breathing has gradually worsened. He has pulmonary hypertension and pulmonary fibrosis and sees a pulmonologist at [facility]. He has not had syncope.   ROS-See HPI    Studies Reviewed:      Results LABS Hemoglobin: 14.4 (07/19/2023) Creatinine: 1.16 (07/19/2023) Potassium: 4.6 (07/19/2023) ALT: 11 (07/19/2023) LDL: 40 (01/26/2023) HDL: 75.2 (01/26/2023) Total cholesterol: 90 (01/26/2023) Triglycerides: 128 (01/26/2023)         Physical Exam:  VS:  BP 120/70   Pulse 74   Ht 5' 9 (1.753 m)   Wt 214 lb (97.1 kg)   SpO2 96%   BMI 31.60 kg/m        Wt Readings from Last 3 Encounters:  08/07/23 214 lb (97.1 kg)  08/02/23 210 lb (95.3 kg)  07/06/23 210 lb 12.8 oz (95.6 kg)    Constitutional:      Appearance: Healthy appearance. Not in distress.  Neck:     Vascular: No JVR. JVD normal.  Pulmonary:     Breath sounds: Normal breath sounds. No wheezing. No rales.  Cardiovascular:     Normal rate. Regular rhythm.     Murmurs: There is no murmur.  Edema:    Peripheral edema present.    Ankle: 1+ edema of the left ankle  and trace edema of the right ankle. Abdominal:     Palpations: Abdomen is soft.        Assessment and Plan:    Assessment & Plan Nonrheumatic aortic valve stenosis S/p TAVR Jun 27, 2023 at [facility]. Postoperative echocardiogram showed well-seated prosthesis with no paravalvular leak. He has not had further anginal symptoms since TAVR. He continues to experience shortness of breath which is likely largely contributed to from his pulmonary disease. He has been referred to cardiac rehabilitation. I think this is a great idea. - I will arrange one-year follow-up with our structural heart clinic with a follow up echocardiogram (May 2026) -  Continue SBE prophylaxis  Coronary artery disease involving native coronary artery of native heart without angina pectoris S/p CABG in 2008. Right and left heart catheterization prior to TAVR showed 100% occluded vein grafts to the ramus and PDA, patent LIMA to LAD, occluded distal LCA and proximal RCA. No further angina since TAVR. Medical therapy recommended. - Continue amlodipine  10 mg daily - Continue aspirin 81 mg daily - Continue metoprolol  tartrate 100 mg twice daily - Continue rosuvastatin  (Crestor ) 10 mg daily Essential hypertension Blood pressure controlled with current medication regimen. - Continue amlodipine  10 mg daily - Continue metoprolol  tartrate 100 mg twice daily Other secondary pulmonary hypertension (HCC) Moderate pulmonary hypertension likely all due to pulmonary fibrosis.  Chronic heart failure with preserved ejection fraction (HCC) Volume status appears stable. Shortness of breath mostly related to pulmonary disease.   - Continue furosemide  20 mg daily - Take an extra dose of furosemide  20 mg if weight increases by more than 3 pounds in a day Pulmonary fibrosis (HCC) Continue follow up with pulmonology at [facility].  Abdominal aortic aneurysm (AAA): Status post EVAR, August 2020 He is followed by vascular surgery at [facility]. Cirrhosis, nonalcoholic (HCC) S/p Liver transplant. He is followed at [facility]. Mixed hyperlipidemia LDL levels are optimal with current statin therapy. - Continue rosuvastatin  (Crestor ) 10 mg daily       Cardiac Rehabilitation Eligibility Assessment  The patient is ready to start cardiac rehabilitation from a cardiac standpoint.     Dispo:  Return in about 6 months (around 02/06/2024) for Routine Follow Up, w/ Dr. Wonda.  Signed, William Ferrier, PA-C

## 2023-08-07 ENCOUNTER — Ambulatory Visit: Attending: Physician Assistant | Admitting: Physician Assistant

## 2023-08-07 ENCOUNTER — Encounter: Payer: Self-pay | Admitting: Physician Assistant

## 2023-08-07 VITALS — BP 120/70 | HR 74 | Ht 69.0 in | Wt 214.0 lb

## 2023-08-07 DIAGNOSIS — I2729 Other secondary pulmonary hypertension: Secondary | ICD-10-CM | POA: Diagnosis not present

## 2023-08-07 DIAGNOSIS — K7460 Unspecified cirrhosis of liver: Secondary | ICD-10-CM

## 2023-08-07 DIAGNOSIS — E782 Mixed hyperlipidemia: Secondary | ICD-10-CM

## 2023-08-07 DIAGNOSIS — I5032 Chronic diastolic (congestive) heart failure: Secondary | ICD-10-CM

## 2023-08-07 DIAGNOSIS — I714 Abdominal aortic aneurysm, without rupture, unspecified: Secondary | ICD-10-CM

## 2023-08-07 DIAGNOSIS — I10 Essential (primary) hypertension: Secondary | ICD-10-CM

## 2023-08-07 DIAGNOSIS — I350 Nonrheumatic aortic (valve) stenosis: Secondary | ICD-10-CM | POA: Diagnosis not present

## 2023-08-07 DIAGNOSIS — I2510 Atherosclerotic heart disease of native coronary artery without angina pectoris: Secondary | ICD-10-CM

## 2023-08-07 DIAGNOSIS — J8410 Pulmonary fibrosis, unspecified: Secondary | ICD-10-CM

## 2023-08-07 NOTE — Assessment & Plan Note (Signed)
 S/p Liver transplant. He is followed at [facility].

## 2023-08-07 NOTE — Patient Instructions (Signed)
 Medication Instructions:  Your physician recommends that you continue on your current medications as directed. Please refer to the Current Medication list given to you today.  *If you need a refill on your cardiac medications before your next appointment, please call your pharmacy*  Lab Work: None ordered  If you have labs (blood work) drawn today and your tests are completely normal, you will receive your results only by: MyChart Message (if you have MyChart) OR A paper copy in the mail If you have any lab test that is abnormal or we need to change your treatment, we will call you to review the results.  Testing/Procedures: None ordered  Follow-Up: At [facility], you and your health needs are our priority.  As part of our continuing mission to provide you with exceptional heart care, our providers are all part of one team.  This team includes your primary Cardiologist (physician) and Advanced Practice Providers or APPs (Physician Assistants and Nurse Practitioners) who all work together to provide you with the care you need, when you need it.  Your next appointment:   6 month(s)  12 month(s)  Provider:    [name], MD     Izetta Hummer, PA-C  We recommend signing up for the patient portal called MyChart.  Sign up information is provided on this After Visit Summary.  MyChart is used to connect with patients for Virtual Visits (Telemedicine).  Patients are able to view lab/test results, encounter notes, upcoming appointments, etc.  Non-urgent messages can be sent to your provider as well.   To learn more about what you can do with MyChart, go to [email or web address].   Other Instructions

## 2023-08-07 NOTE — Assessment & Plan Note (Signed)
 He is followed by vascular surgery at [facility].

## 2023-08-07 NOTE — Assessment & Plan Note (Signed)
 Blood pressure controlled with current medication regimen. - Continue amlodipine  10 mg daily - Continue metoprolol  tartrate 100 mg twice daily

## 2023-08-07 NOTE — Assessment & Plan Note (Signed)
 LDL levels are optimal with current statin therapy. - Continue rosuvastatin  (Crestor ) 10 mg daily

## 2023-08-09 ENCOUNTER — Other Ambulatory Visit (HOSPITAL_COMMUNITY): Payer: Self-pay | Admitting: *Deleted

## 2023-08-09 ENCOUNTER — Telehealth (HOSPITAL_COMMUNITY): Payer: Self-pay | Admitting: *Deleted

## 2023-08-09 DIAGNOSIS — Z952 Presence of prosthetic heart valve: Secondary | ICD-10-CM

## 2023-08-09 NOTE — Telephone Encounter (Signed)
-----   Message from [name] sent at 08/09/2023 11:39 AM EDT ----- Regarding: RE: Agree to Cardiac rehab Yes that seems appropriate. Thanks Yaelis Scharfenberg ----- Message ----- From: Bernett Saturnino NOVAK, RN Sent: 08/09/2023  10:22 AM EDT To: [name], MD Subject: Agree to Cardiac rehab                         Dr. Fell,  The above pt (former Dr. Burnard) is s/p 06/27/23 TAVR at [facility] by Dr. Vinita seen by local cardiologist - Glendia Ferrier PA on 6/30.  Cardiac rehab is recommended. I have placed a referral pending your cosignature in agreement.   Medical history of ILD  with the use of oxygen  therapy.  Follows with Dr. Jesus at [facility]. Last seen on 5/9 and next follow up is in September  For our cardiac patients we do ask for an ok to use oxygen  during exercise and to titrate to maintain the set  acceptable oxygen  saturation.   Typically for our cardiac pt's we maintain maintain O2 saturation of 90% and above at rest and on exertion.  If appropriate for this pt, ok to titrate oxygen  level to maintain this saturation?   Thanks so much for your response. This will serve as documentation and saved to the patient's chart.  Thanks Pharmacist, hospital, BSN  Cardiac and Emergency planning/management officer

## 2023-08-12 ENCOUNTER — Other Ambulatory Visit: Payer: Self-pay | Admitting: Family Medicine

## 2023-08-14 ENCOUNTER — Other Ambulatory Visit (HOSPITAL_COMMUNITY): Payer: Self-pay

## 2023-08-14 ENCOUNTER — Other Ambulatory Visit: Payer: Self-pay

## 2023-08-14 MED ORDER — METOPROLOL TARTRATE 100 MG PO TABS
100.0000 mg | ORAL_TABLET | Freq: Two times a day (BID) | ORAL | 1 refills | Status: DC
  Filled 2023-08-14: qty 90, 45d supply, fill #0
  Filled 2023-09-27: qty 90, 45d supply, fill #1

## 2023-08-17 ENCOUNTER — Other Ambulatory Visit: Payer: Self-pay

## 2023-08-17 ENCOUNTER — Encounter (INDEPENDENT_AMBULATORY_CARE_PROVIDER_SITE_OTHER): Payer: Self-pay

## 2023-08-17 ENCOUNTER — Other Ambulatory Visit: Payer: Self-pay | Admitting: Pharmacy Technician

## 2023-08-17 NOTE — Progress Notes (Signed)
 Specialty Pharmacy Refill Coordination Note  William [name] is a 73 y.o. male contacted today regarding refills of specialty medication(s) Tacrolimus  (PROGRAF )   Patient requested (Patient-Rptd) Pickup at [facility] at [facility] date: (Patient-Rptd) 09/01/23   Medication will be filled on 08/31/23.

## 2023-08-21 ENCOUNTER — Telehealth (HOSPITAL_COMMUNITY): Payer: Self-pay

## 2023-08-21 ENCOUNTER — Encounter (HOSPITAL_COMMUNITY): Payer: Self-pay

## 2023-08-21 DIAGNOSIS — Z944 Liver transplant status: Secondary | ICD-10-CM | POA: Diagnosis not present

## 2023-08-21 DIAGNOSIS — D849 Immunodeficiency, unspecified: Secondary | ICD-10-CM | POA: Diagnosis not present

## 2023-08-21 NOTE — Telephone Encounter (Signed)
 Attempted to call patient in regards to Cardiac Rehab - LM on VM Mailed letter

## 2023-08-21 NOTE — Telephone Encounter (Signed)
 Pt wife returned phone call and is interested in the cardiac rehab. Pt will come in for orientation on 7/22@1030  and will attend the 8:15 exercise class time.   Sent packet

## 2023-08-28 ENCOUNTER — Other Ambulatory Visit (HOSPITAL_COMMUNITY): Payer: Self-pay

## 2023-08-28 ENCOUNTER — Other Ambulatory Visit: Payer: Self-pay

## 2023-08-28 MED ORDER — ROSUVASTATIN CALCIUM 10 MG PO TABS
10.0000 mg | ORAL_TABLET | Freq: Every day | ORAL | 3 refills | Status: DC
  Filled 2023-08-28: qty 90, 90d supply, fill #0
  Filled 2023-11-20: qty 90, 90d supply, fill #1

## 2023-08-29 ENCOUNTER — Encounter (HOSPITAL_COMMUNITY)
Admission: RE | Admit: 2023-08-29 | Discharge: 2023-08-29 | Disposition: A | Discharge status: Home / Self Care | Source: Ambulatory Visit | Attending: Cardiovascular Disease | Admitting: Cardiovascular Disease

## 2023-08-29 VITALS — BP 110/64 | HR 66 | Ht 69.0 in | Wt 213.8 lb

## 2023-08-29 DIAGNOSIS — Z952 Presence of prosthetic heart valve: Secondary | ICD-10-CM | POA: Diagnosis not present

## 2023-08-29 LAB — GLUCOSE, CAPILLARY: Glucose-Capillary: 125 mg/dL — ABNORMAL HIGH (ref 70–99)

## 2023-08-29 NOTE — Progress Notes (Signed)
 Cardiac Rehab Medication Review   Does the patient  feel that his/her medications are working for him/her?  yes  Has the patient been experiencing any side effects to the medications prescribed?  no  Does the patient measure his/her own blood pressure or blood glucose at home?  yes checks blood pressure about once a week and blood glucose typically daily  Does the patient have any problems obtaining medications due to transportation or finances?   no  Understanding of regimen: excellent Understanding of indications: good Potential of compliance: excellent   Reviewed medications with pt who brought list from home that his wife who dispenses his medications wrote out what he is taking.    Kalise Fickett Con Koyanagi RN 08/29/2023 12:00 PM

## 2023-08-29 NOTE — Progress Notes (Signed)
 Hiram in today for cardiac rehab orientation.  Pt arrives with oxygen  on at 8L on exertion and 4L at rest.  Rutha has pulse ox which reads 95% on 4Lnc sitting down.  This corresponds to our O2 saturation pulse ox.  Pt reports to the nurse that he has noticed that with any exertion even with his oxygen  increased to8L his oxygen  level drops to the mid 80'ss and he at times becomes dizzy and foggy. This rebounds over 30 seconds to minute with rest.  Lung sounds clear to auscultation both anterior and posterior.  Weight stable per pt. Pt sees Dr. Jesus at [facility] for his pulmonology needs and has follow up with him late September. 6 minute walk test completed for the development of the exercise prescription by the Exercise Physiologist.  Pre  HR: 66, BP 110/64, O2 sat 95% on 4L, CBG 125 switched to 8L at the beginning of the walk using high liter flow nasal canula tubing 1 minute  HR 80 O2 sat 95% walking 2 minute HR 79 O2 sat dropped 83%  walking stopped sat in chair  3 minute HR 72  O2 sat 82%  sitting 4 minute  HR 70 O2 sat 86% sitting 5 minute  HR 66  O2 sat 91% sitting 6 minute  HR 64  O2 sat 95% sitting test concluded.  Ambulated 330 feet Post HR 80 BP  128/80  O2 sat 97% RPE 15 dyspnea scale 3 complained of moderate shortness of breath 2 minute post HR 64, BP 118/68  Called and spoke to Dr. Jesus triage nurse at [facility] pulmonary/ILD clinic.  Will send the above note for review.  Asked to also let local cardiology know.  Will hold on continued exercise for any follow up and clearance to proceed. Keyandre denies any complaints and will wait to hear from his provider for next steps.  Gilmar Bua Bernett PEAK, BSN [facility] Cardiac and Emergency planning/management officer  [phone]

## 2023-08-29 NOTE — Progress Notes (Incomplete)
 Cardiac Individual Treatment Plan  Patient Details  Name: William [name] MRN: [id number] Date of Birth: 1950/04/16 Referring Provider:   Conrad Ports Pulmonary Rehab Walk Test from 08/28/2019 in [facility]  Referring Provider [place], [name], MD    Initial Encounter Date:  Flowsheet Row Pulmonary Rehab Walk Test from 08/28/2019 in [facility]  Date 08/28/19    Visit Diagnosis: No diagnosis found.  Patient's Home Medications on Admission:  Current Outpatient Medications:    amLODipine  (NORVASC ) 10 MG tablet, Take 1 tablet (10 mg total) by mouth daily., Disp: 90 tablet, Rfl: 3   ammonium lactate  (AMLACTIN) 12 % cream, Apply 2 times daily to forearms and hands in preparation for treatment with Efudex  (fluorouracil ), Disp: 385 g, Rfl: 99   aspirin EC 81 MG tablet, Take 81 mg by mouth daily. Swallow whole., Disp: , Rfl:    furosemide  (LASIX ) 20 MG tablet, Take 1 tablet (20 mg total) by mouth daily., Disp: 90 tablet, Rfl: 3   Magnesium 400 MG TABS, Take 400 mg by mouth daily., Disp: , Rfl:    metFORMIN  (GLUCOPHAGE ) 500 MG tablet, Take 1 tablet (500 mg total) by mouth 2 (two) times daily with a meal., Disp: 180 tablet, Rfl: 3   metoprolol  tartrate (LOPRESSOR ) 100 MG tablet, Take 1 tablet (100 mg total) by mouth 2 (two) times daily., Disp: 90 tablet, Rfl: 1   mycophenolate  (CELLCEPT ) 250 MG capsule, Take 2 capsules by mouth in the morning and 1 capsule in the evening., Disp: 270 capsule, Rfl: 3   pantoprazole  (PROTONIX ) 40 MG tablet, Take 1 tablet (40 mg total) by mouth daily., Disp: 90 tablet, Rfl: 3   rosuvastatin  (CRESTOR ) 10 MG tablet, Take 1 tablet (10 mg total) by mouth daily., Disp: 90 tablet, Rfl: 3   tacrolimus  (PROGRAF ) 1 MG capsule, Take 2 capsules (2 mg total) by mouth every morning AND 1 capsule (1 mg total) every evening., Disp: 270 capsule, Rfl: 3    triamcinolone  cream (KENALOG ) 0.1 %, Apply 2 times a day to rash. Follow with CeraVe Cream.  Stop when smooth., Disp: 80 g, Rfl: 2   acitretin  (SORIATANE ) 10 MG capsule, Take 1 capsule by mouth daily (Patient not taking: Reported on 08/29/2023), Disp: 30 capsule, Rfl: 5 No current facility-administered medications for this encounter.  Facility-Administered Medications Ordered in Other Encounters:    heparin  lock flush 100 unit/mL, 500 Units, Intracatheter, Daily PRN, Lanny Callander, MD   sodium chloride  flush (NS) 0.9 % injection 10 mL, 10 mL, Intracatheter, PRN, Lanny Callander, MD  Past Medical History: Past Medical History:  Diagnosis Date   AAA (abdominal aortic aneurysm) (HCC) 03/2014   repaired 2020   Ascending aortic aneurysm (HCC)    unrepaired. Stable at 4.3 cm on imaging by [facility] thoracic surg 10/2019 and 10/2020.   Bilateral renal cysts    simple (03/2014 MRI)   CAD (coronary artery disease)    Cholelithiases 2018   asymptomatic   Chronic diastolic heart failure (HCC)    Chronic renal insufficiency, stage 3 (moderate) (HCC) 2022   baseline sCr 1.3-1.5 (GFR 55)   COVID-19 virus infection 02/2020   sotrovimab  infusion   Diabetes mellitus with complication (HCC) 07/2014   A1c 6.8%. A1c 5.4% 11/2019   Diverticulosis    Gout    initially dx'd by diag arthrocentesis of elbow effusion in winter 2021, 3 episodes, all came after being  started on chlorthalidone ->I d/c'd chlorthal.   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  Improving + off oxygen  as of 3 mo s/p liver transplant.  Doing well/resolved as of 02/2020 [facility] pulm f/u->they'll continue to follow. No worsening on CT imaging at [facility] 03/2023   History of blood transfusion 2018 X 4 dates   Chronic GI blood loss of unknown location despite full GI endoscopic eval, etc   History of liver transplant (HCC) 07/2019   [facility]   Hyperkalemia    Lokelma    Hyperlipemia, mixed    elevated LFTs when on statins.     Hypertension    Cr bump 04/01/16  so I changed benicar -hct to benicar  plain and added amlodipine  5 mg.   Increased prostate specific antigen (PSA) velocity 2021   0.11 to 4.77 from 2020 to 2021 (he got a liver transplant and was put on antirejection meds between these psa checks).   Iron  deficiency anemia 05/2016   Acute blood loss anemia: hospitalized, required transfusion x 3 U: colonoscopy and capsule study unrevealing.  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  W/u for ? hemolytic anemia to be pursued by hematologist as outpt.  Dr. Charlean, GI at [facility] following, too---he rec'd onc do bone marrow bx as of Jan 2019   Iron  deficiency anemia due to chronic blood loss 2018/19   GI: transfusions x >20 required; multiple endoscopies and bleeding scans unrevealing. Lysteda  and octreotide  + monthly iron  infusions as of 04/2017. Iron  infusions changed to every other month as of 09/2018 hem f/u.   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  Changes noted on all CXRs in 2018/2019.  Liver Transplant eval 03/2018->mild restriction on PFTs but no obstruction.  See further details in [facility] pulm fibrosis section   Microscopic hematuria    Eval unremarkable by Dr. Maryanne.   Nonmelanoma skin cancer 08/15/2018   2020 BCC nose, excised.  2022 R side of neck->mohs   Obesity    Pulmonary fibrosis (HCC)    PFTs: restrictive lung dz ([facility] Liver transplant eval 05/2018). [facility] pulm eval felt this was likely hepatopulmonary syndrome->causing his hypoxia->low likelihood of progression (as of 10/04/18 transplant clinic f/u). Pulm rehab helping as of 2020/2021. OFF  oxygen  as of 10/2019 transp f/u.   Pulmonary fibrosis (HCC)    Gradual worsening of objective testing 2020 05/2023, has had return of oxygen  requirement, diffuse parenchymal lung disease of unclear etiology.   Spleen enlarged     Tobacco Use: Social History   Tobacco Use  Smoking Status Former   Types: Cigarettes  Smokeless Tobacco Never  Tobacco Comments   QUIT I 1983    Labs: Review Flowsheet  More data exists      Latest Ref Rng & Units 01/13/2022 04/20/2022 07/21/2022 10/24/2022 01/26/2023  Labs for ITP Cardiac and Pulmonary Rehab  Cholestrol 0 - 200 mg/dL 885  - 94  - 90   LDL (calc) 0 - 99 mg/dL 53  - 41  - 40   HDL-C >39.00 mg/dL 74.39  - 74.09  - 75.29   Trlycerides 0.0 -  149.0 mg/dL 820.9  - 863.9  - 871.9   Hemoglobin A1c 5.7 - 6.4 % 0.0 - 7.0 % 4.0 - 5.6 % 4.0 - 5.6 % 6.3  6.3  6.3  6.3  7.8  7.8  7.8  7.8  5.5  5.5  5.5  5.5  5.7  5.7  5.7  5.7  5.6  5.6  5.6  5.6     Details       Multiple values from one day are sorted in reverse-chronological order         Capillary Blood Glucose: Lab Results  Component Value Date   GLUCAP 125 (H) 08/29/2023   GLUCAP 154 (H) 05/11/2017   GLUCAP 109 (H) 12/01/2016   GLUCAP 127 (H) 12/01/2016   GLUCAP 122 (H) 09/23/2016     Exercise Target Goals: Exercise Program Goal: Individual exercise prescription set using results from initial 6 min walk test and THRR while considering  patient's activity barriers and safety.   Exercise Prescription Goal: Initial exercise prescription builds to 30-45 minutes a day of aerobic activity, 2-3 days per week.  Home exercise guidelines will be given to patient during program as part of exercise prescription that the participant will acknowledge.  Activity Barriers & Risk Stratification:   6 Minute Walk:   Oxygen  Initial Assessment:   Oxygen  Re-Evaluation:   Oxygen  Discharge (Final Oxygen  Re-Evaluation):   Initial Exercise Prescription:   Perform Capillary Blood  Glucose checks as needed.  Exercise Prescription Changes:   Exercise Comments:   Exercise Goals and Review:   Exercise Goals Re-Evaluation :   Discharge Exercise Prescription (Final Exercise Prescription Changes):   Nutrition:  Target Goals: Understanding of nutrition guidelines, daily intake of sodium 1500mg , cholesterol 200mg , calories 30% from fat and 7% or less from saturated fats, daily to have 5 or more servings of fruits and vegetables.  Biometrics:    Nutrition Therapy Plan and Nutrition Goals:   Nutrition Assessments:  MEDIFICTS Score Key: >=70 Need to make dietary changes  40-70 Heart Healthy Diet <= 40 Therapeutic Level Cholesterol Diet    Picture Your Plate Scores: <59 Unhealthy dietary pattern with much room for improvement. 41-50 Dietary pattern unlikely to meet recommendations for good health and room for improvement. 51-60 More healthful dietary pattern, with some room for improvement.  >60 Healthy dietary pattern, although there may be some specific behaviors that could be improved.    Nutrition Goals Re-Evaluation:   Nutrition Goals Re-Evaluation:   Nutrition Goals Discharge (Final Nutrition Goals Re-Evaluation):   Psychosocial: Target Goals: Acknowledge presence or absence of significant depression and/or stress, maximize coping skills, provide positive support system. Participant is able to verbalize types and ability to use techniques and skills needed for reducing stress and depression.  Initial Review & Psychosocial Screening:  Initial Psych Review & Screening - 08/29/23 1445       Initial Review   Current issues with None Identified      Family Dynamics   Good Support System? Yes      Barriers   Psychosocial barriers to participate in program There are no identifiable barriers or psychosocial needs.      Screening Interventions   Interventions Encouraged to exercise    Expected Outcomes Short Term goal: Utilizing  psychosocial counselor, staff and physician to assist with identification of specific Stressors or current issues interfering with healing process. Setting desired goal for each stressor or current issue identified.;Long Term Goal: Stressors or current issues are controlled or eliminated.;Short Term  goal: Identification and review with participant of any Quality of Life or Depression concerns found by scoring the questionnaire.;Long Term goal: The participant improves quality of Life and PHQ9 Scores as seen by post scores and/or verbalization of changes          Quality of Life Scores:  Scores of 19 and below usually indicate a poorer quality of life in these areas.  A difference of  2-3 points is a clinically meaningful difference.  A difference of 2-3 points in the total score of the Quality of Life Index has been associated with significant improvement in overall quality of life, self-image, physical symptoms, and general health in studies assessing change in quality of life.  PHQ-9: Review Flowsheet  More data exists      08/29/2023 08/02/2023 07/06/2023 07/27/2022 02/14/2022  Depression screen PHQ 2/9  Decreased Interest 2 0 0 0 0  Down, Depressed, Hopeless 0 0 0 0 0  PHQ - 2 Score 2 0 0 0 0  Altered sleeping 0 0 - - 0  Tired, decreased energy 2 0 - - 0  Change in appetite 0 0 - - 0  Feeling bad or failure about yourself  0 0 - - 0  Trouble concentrating 0 0 - - 0  Moving slowly or fidgety/restless 0 0 - - 0  Suicidal thoughts 0 0 - - 0  PHQ-9 Score 4 0 - - 0  Difficult doing work/chores Not difficult at all Not difficult at all - - Not difficult at all   Interpretation of Total Score  Total Score Depression Severity:  1-4 = Minimal depression, 5-9 = Mild depression, 10-14 = Moderate depression, 15-19 = Moderately severe depression, 20-27 = Severe depression   Psychosocial Evaluation and Intervention:   Psychosocial Re-Evaluation:   Psychosocial Discharge (Final Psychosocial  Re-Evaluation):   Vocational Rehabilitation: Provide vocational rehab assistance to qualifying candidates.   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 Participant will state understanding/return demonstration of topics presented.     Core Videos: Exercise    Move It!  Clinical staff conducted group or individual video education with verbal and written material and guidebook.  Patient learns the recommended Pritikin exercise program. Exercise with the goal of living a long, healthy life. Some of the health benefits of exercise include controlled diabetes, healthier blood pressure levels, improved cholesterol levels, improved heart and lung capacity, improved sleep, and better body composition. Everyone should speak with their doctor before starting or changing an exercise routine.  Biomechanical Limitations Clinical staff conducted group or individual video education with verbal and written material and guidebook.  Patient learns how biomechanical limitations can impact exercise and how we can mitigate and possibly overcome limitations to have an impactful and balanced exercise routine.  Body Composition Clinical staff conducted group or individual video education with verbal and written material and guidebook.  Patient learns that body composition (ratio of muscle mass to fat mass) is a key component to assessing overall fitness, rather than body weight alone. Increased fat mass, especially visceral belly fat, can put us  at increased risk for metabolic syndrome, type 2 diabetes, heart disease,  and even death. It is recommended to combine diet and exercise  (cardiovascular and resistance training) to improve your body composition. Seek guidance from your physician and exercise physiologist before implementing an exercise routine.  Exercise Action Plan Clinical staff conducted group or individual video education with verbal and written material and guidebook.  Patient learns the recommended strategies to achieve and enjoy long-term exercise adherence, including variety, self-motivation, self-efficacy, and positive decision making. Benefits of exercise include fitness, good health, weight management, more energy, better sleep, less stress, and overall well-being.  Medical   Heart Disease Risk Reduction Clinical staff conducted group or individual video education with verbal and written material and guidebook.  Patient learns our heart is our most vital organ as it circulates oxygen , nutrients, white blood cells, and hormones throughout the entire body, and carries waste away. Data supports a plant-based eating plan like the Pritikin Program for its effectiveness in slowing progression of and reversing heart disease. The video provides a number of recommendations to address heart disease.   Metabolic Syndrome and Belly Fat  Clinical staff conducted group or individual video education with verbal and written material and guidebook.  Patient learns what metabolic syndrome is, how it leads to heart disease, and how one can reverse it and keep it from coming back. You have metabolic syndrome if you have 3 of the following 5 criteria: abdominal obesity, high blood pressure, high triglycerides, low HDL cholesterol, and high blood sugar.  Hypertension and Heart Disease Clinical staff conducted group or individual video education with verbal and written material and guidebook.  Patient learns that high blood pressure, or hypertension, is very common in the [place] . Hypertension is largely due to excessive salt intake, but other important risk factors  include being overweight, physical inactivity, drinking too much alcohol, smoking, and not eating enough potassium from fruits and vegetables. High blood pressure is a leading risk factor for heart attack, stroke, congestive heart failure, dementia, kidney failure, and premature death. Long-term effects of excessive salt intake include stiffening of the arteries and thickening of heart muscle and organ damage. Recommendations include ways to reduce hypertension and the risk of heart disease.  Diseases of Our Time - Focusing on Diabetes Clinical staff conducted group or individual video education with verbal and written material and guidebook.  Patient learns why the best way to stop diseases of our time is prevention, through food and other lifestyle changes. Medicine (such as prescription pills and surgeries) is often only a Band-Aid on the problem, not a long-term solution. Most common diseases of our time include obesity, type 2 diabetes, hypertension, heart disease, and cancer. The Pritikin Program is recommended and has been proven to help reduce, reverse, and/or prevent the damaging effects of metabolic syndrome.  Nutrition   Overview of the Pritikin Eating Plan  Clinical staff conducted group or individual video education with verbal and written material and guidebook.  Patient learns about the Pritikin Eating Plan for disease risk reduction. The Pritikin Eating Plan emphasizes a wide variety of unrefined, minimally-processed carbohydrates, like fruits, vegetables, whole grains, and legumes. Go, Caution, and Stop food choices are explained. Plant-based and lean animal proteins are emphasized. Rationale provided for low sodium intake for blood pressure control, low added sugars for blood sugar stabilization, and low added fats and oils for coronary artery disease risk reduction and weight management.  Calorie Density  Clinical staff conducted group or individual video education with verbal and  written material and guidebook.  Patient learns about  calorie density and how it impacts the Pritikin Eating Plan. Knowing the characteristics of the food you choose will help you decide whether those foods will lead to weight gain or weight loss, and whether you want to consume more or less of them. Weight loss is usually a side effect of the Pritikin Eating Plan because of its focus on low calorie-dense foods.  Label Reading  Clinical staff conducted group or individual video education with verbal and written material and guidebook.  Patient learns about the Pritikin recommended label reading guidelines and corresponding recommendations regarding calorie density, added sugars, sodium content, and whole grains.  Dining Out - Part 1  Clinical staff conducted group or individual video education with verbal and written material and guidebook.  Patient learns that restaurant meals can be sabotaging because they can be so high in calories, fat, sodium, and/or sugar. Patient learns recommended strategies on how to positively address this and avoid unhealthy pitfalls.  Facts on Fats  Clinical staff conducted group or individual video education with verbal and written material and guidebook.  Patient learns that lifestyle modifications can be just as effective, if not more so, as many medications for lowering your risk of heart disease. A Pritikin lifestyle can help to reduce your risk of inflammation and atherosclerosis (cholesterol build-up, or plaque, in the artery walls). Lifestyle interventions such as dietary choices and physical activity address the cause of atherosclerosis. A review of the types of fats and their impact on blood cholesterol levels, along with dietary recommendations to reduce fat intake is also included.  Nutrition Action Plan  Clinical staff conducted group or individual video education with verbal and written material and guidebook.  Patient learns how to incorporate Pritikin  recommendations into their lifestyle. Recommendations include planning and keeping personal health goals in mind as an important part of their success.  Healthy Mind-Set    Healthy Minds, Bodies, Hearts  Clinical staff conducted group or individual video education with verbal and written material and guidebook.  Patient learns how to identify when they are stressed. Video will discuss the impact of that stress, as well as the many benefits of stress management. Patient will also be introduced to stress management techniques. The way we think, act, and feel has an impact on our hearts.  How Our Thoughts Can Heal Our Hearts  Clinical staff conducted group or individual video education with verbal and written material and guidebook.  Patient learns that negative thoughts can cause depression and anxiety. This can result in negative lifestyle behavior and serious health problems. Cognitive behavioral therapy is an effective method to help control our thoughts in order to change and improve our emotional outlook.  Additional Videos:  Exercise    Improving Performance  Clinical staff conducted group or individual video education with verbal and written material and guidebook.  Patient learns to use a non-linear approach by alternating intensity levels and lengths of time spent exercising to help burn more calories and lose more body fat. Cardiovascular exercise helps improve heart health, metabolism, hormonal balance, blood sugar control, and recovery from fatigue. Resistance training improves strength, endurance, balance, coordination, reaction time, metabolism, and muscle mass. Flexibility exercise improves circulation, posture, and balance. Seek guidance from your physician and exercise physiologist before implementing an exercise routine and learn your capabilities and proper form for all exercise.  Introduction to Yoga  Clinical staff conducted group or individual video education with verbal and  written material and guidebook.  Patient learns about yoga, a discipline  of the coming together of mind, breath, and body. The benefits of yoga include improved flexibility, improved range of motion, better posture and core strength, increased lung function, weight loss, and positive self-image. Yoga's heart health benefits include lowered blood pressure, healthier heart rate, decreased cholesterol and triglyceride levels, improved immune function, and reduced stress. Seek guidance from your physician and exercise physiologist before implementing an exercise routine and learn your capabilities and proper form for all exercise.  Medical   Aging: Enhancing Your Quality of Life  Clinical staff conducted group or individual video education with verbal and written material and guidebook.  Patient learns key strategies and recommendations to stay in good physical health and enhance quality of life, such as prevention strategies, having an advocate, securing a Health Care Proxy and Power of Attorney, and keeping a list of medications and system for tracking them. It also discusses how to avoid risk for bone loss.  Biology of Weight Control  Clinical staff conducted group or individual video education with verbal and written material and guidebook.  Patient learns that weight gain occurs because we consume more calories than we burn (eating more, moving less). Even if your body weight is normal, you may have higher ratios of fat compared to muscle mass. Too much body fat puts you at increased risk for cardiovascular disease, heart attack, stroke, type 2 diabetes, and obesity-related cancers. In addition to exercise, following the Pritikin Eating Plan can help reduce your risk.  Decoding Lab Results  Clinical staff conducted group or individual video education with verbal and written material and guidebook.  Patient learns that lab test reflects one measurement whose values change over time and are influenced  by many factors, including medication, stress, sleep, exercise, food, hydration, pre-existing medical conditions, and more. It is recommended to use the knowledge from this video to become more involved with your lab results and evaluate your numbers to speak with your doctor.   Diseases of Our Time - Overview  Clinical staff conducted group or individual video education with verbal and written material and guidebook.  Patient learns that according to the CDC, 50% to 70% of chronic diseases (such as obesity, type 2 diabetes, elevated lipids, hypertension, and heart disease) are avoidable through lifestyle improvements including healthier food choices, listening to satiety cues, and increased physical activity.  Sleep Disorders Clinical staff conducted group or individual video education with verbal and written material and guidebook.  Patient learns how good quality and duration of sleep are important to overall health and well-being. Patient also learns about sleep disorders and how they impact health along with recommendations to address them, including discussing with a physician.  Nutrition  Dining Out - Part 2 Clinical staff conducted group or individual video education with verbal and written material and guidebook.  Patient learns how to plan ahead and communicate in order to maximize their dining experience in a healthy and nutritious manner. Included are recommended food choices based on the type of restaurant the patient is visiting.   Fueling a Banker conducted group or individual video education with verbal and written material and guidebook.  There is a strong connection between our food choices and our health. Diseases like obesity and type 2 diabetes are very prevalent and are in large-part due to lifestyle choices. The Pritikin Eating Plan provides plenty of food and hunger-curbing satisfaction. It is easy to follow, affordable, and helps reduce health  risks.  Menu Workshop  Clinical staff conducted group  or individual video education with verbal and written material and guidebook.  Patient learns that restaurant meals can sabotage health goals because they are often packed with calories, fat, sodium, and sugar. Recommendations include strategies to plan ahead and to communicate with the manager, chef, or server to help order a healthier meal.  Planning Your Eating Strategy  Clinical staff conducted group or individual video education with verbal and written material and guidebook.  Patient learns about the Pritikin Eating Plan and its benefit of reducing the risk of disease. The Pritikin Eating Plan does not focus on calories. Instead, it emphasizes high-quality, nutrient-rich foods. By knowing the characteristics of the foods, we choose, we can determine their calorie density and make informed decisions.  Targeting Your Nutrition Priorities  Clinical staff conducted group or individual video education with verbal and written material and guidebook.  Patient learns that lifestyle habits have a tremendous impact on disease risk and progression. This video provides eating and physical activity recommendations based on your personal health goals, such as reducing LDL cholesterol, losing weight, preventing or controlling type 2 diabetes, and reducing high blood pressure.  Vitamins and Minerals  Clinical staff conducted group or individual video education with verbal and written material and guidebook.  Patient learns different ways to obtain key vitamins and minerals, including through a recommended healthy diet. It is important to discuss all supplements you take with your doctor.   Healthy Mind-Set    Smoking Cessation  Clinical staff conducted group or individual video education with verbal and written material and guidebook.  Patient learns that cigarette smoking and tobacco addiction pose a serious health risk which affects millions of  people. Stopping smoking will significantly reduce the risk of heart disease, lung disease, and many forms of cancer. Recommended strategies for quitting are covered, including working with your doctor to develop a successful plan.  Culinary   Becoming a Set designer conducted group or individual video education with verbal and written material and guidebook.  Patient learns that cooking at home can be healthy, cost-effective, quick, and puts them in control. Keys to cooking healthy recipes will include looking at your recipe, assessing your equipment needs, planning ahead, making it simple, choosing cost-effective seasonal ingredients, and limiting the use of added fats, salts, and sugars.  Cooking - Breakfast and Snacks  Clinical staff conducted group or individual video education with verbal and written material and guidebook.  Patient learns how important breakfast is to satiety and nutrition through the entire day. Recommendations include key foods to eat during breakfast to help stabilize blood sugar levels and to prevent overeating at meals later in the day. Planning ahead is also a key component.  Cooking - Educational psychologist conducted group or individual video education with verbal and written material and guidebook.  Patient learns eating strategies to improve overall health, including an approach to cook more at home. Recommendations include thinking of animal protein as a side on your plate rather than center stage and focusing instead on lower calorie dense options like vegetables, fruits, whole grains, and plant-based proteins, such as beans. Making sauces in large quantities to freeze for later and leaving the skin on your vegetables are also recommended to maximize your experience.  Cooking - Healthy Salads and Dressing Clinical staff conducted group or individual video education with verbal and written material and guidebook.  Patient learns that  vegetables, fruits, whole grains, and legumes are the foundations of the Pritikin Eating  Plan. Recommendations include how to incorporate each of these in flavorful and healthy salads, and how to create homemade salad dressings. Proper handling of ingredients is also covered. Cooking - Soups and [employer] - Soups and Desserts Clinical staff conducted group or individual video education with verbal and written material and guidebook.  Patient learns that Pritikin soups and desserts make for easy, nutritious, and delicious snacks and meal components that are low in sodium, fat, sugar, and calorie density, while high in vitamins, minerals, and filling fiber. Recommendations include simple and healthy ideas for soups and desserts.   Overview     The Pritikin Solution Program Overview Clinical staff conducted group or individual video education with verbal and written material and guidebook.  Patient learns that the results of the Pritikin Program have been documented in more than 100 articles published in peer-reviewed journals, and the benefits include reducing risk factors for (and, in some cases, even reversing) high cholesterol, high blood pressure, type 2 diabetes, obesity, and more! An overview of the three key pillars of the Pritikin Program will be covered: eating well, doing regular exercise, and having a healthy mind-set.  WORKSHOPS  Exercise: Exercise Basics: Building Your Action Plan Clinical staff led group instruction and group discussion with PowerPoint presentation and patient guidebook. To enhance the learning environment the use of posters, models and videos may be added. At the conclusion of this workshop, patients will comprehend the difference between physical activity and exercise, as well as the benefits of incorporating both, into their routine. Patients will understand the FITT (Frequency, Intensity, Time, and Type) principle and how to use it to build an exercise action  plan. In addition, safety concerns and other considerations for exercise and cardiac rehab will be addressed by the presenter. The purpose of this lesson is to promote a comprehensive and effective weekly exercise routine in order to improve patients' overall level of fitness.   Managing Heart Disease: Your Path to a Healthier Heart Clinical staff led group instruction and group discussion with PowerPoint presentation and patient guidebook. To enhance the learning environment the use of posters, models and videos may be added.At the conclusion of this workshop, patients will understand the anatomy and physiology of the heart. Additionally, they will understand how Pritikin's three pillars impact the risk factors, the progression, and the management of heart disease.  The purpose of this lesson is to provide a high-level overview of the heart, heart disease, and how the Pritikin lifestyle positively impacts risk factors.  Exercise Biomechanics Clinical staff led group instruction and group discussion with PowerPoint presentation and patient guidebook. To enhance the learning environment the use of posters, models and videos may be added. Patients will learn how the structural parts of their bodies function and how these functions impact their daily activities, movement, and exercise. Patients will learn how to promote a neutral spine, learn how to manage pain, and identify ways to improve their physical movement in order to promote healthy living. The purpose of this lesson is to expose patients to common physical limitations that impact physical activity. Participants will learn practical ways to adapt and manage aches and pains, and to minimize their effect on regular exercise. Patients will learn how to maintain good posture while sitting, walking, and lifting.  Balance Training and Fall Prevention  Clinical staff led group instruction and group discussion with PowerPoint presentation and  patient guidebook. To enhance the learning environment the use of posters, models and videos may be added. At  the conclusion of this workshop, patients will understand the importance of their sensorimotor skills (vision, proprioception, and the vestibular system) in maintaining their ability to balance as they age. Patients will apply a variety of balancing exercises that are appropriate for their current level of function. Patients will understand the common causes for poor balance, possible solutions to these problems, and ways to modify their physical environment in order to minimize their fall risk. The purpose of this lesson is to teach patients about the importance of maintaining balance as they age and ways to minimize their risk of falling.  WORKSHOPS   Nutrition:  Fueling a Ship broker led group instruction and group discussion with PowerPoint presentation and patient guidebook. To enhance the learning environment the use of posters, models and videos may be added. Patients will review the foundational principles of the Pritikin Eating Plan and understand what constitutes a serving size in each of the food groups. Patients will also learn Pritikin-friendly foods that are better choices when away from home and review make-ahead meal and snack options. Calorie density will be reviewed and applied to three nutrition priorities: weight maintenance, weight loss, and weight gain. The purpose of this lesson is to reinforce (in a group setting) the key concepts around what patients are recommended to eat and how to apply these guidelines when away from home by planning and selecting Pritikin-friendly options. Patients will understand how calorie density may be adjusted for different weight management goals.  Mindful Eating  Clinical staff led group instruction and group discussion with PowerPoint presentation and patient guidebook. To enhance the learning environment the use of posters,  models and videos may be added. Patients will briefly review the concepts of the Pritikin Eating Plan and the importance of low-calorie dense foods. The concept of mindful eating will be introduced as well as the importance of paying attention to internal hunger signals. Triggers for non-hunger eating and techniques for dealing with triggers will be explored. The purpose of this lesson is to provide patients with the opportunity to review the basic principles of the Pritikin Eating Plan, discuss the value of eating mindfully and how to measure internal cues of hunger and fullness using the Hunger Scale. Patients will also discuss reasons for non-hunger eating and learn strategies to use for controlling emotional eating.  Targeting Your Nutrition Priorities Clinical staff led group instruction and group discussion with PowerPoint presentation and patient guidebook. To enhance the learning environment the use of posters, models and videos may be added. Patients will learn how to determine their genetic susceptibility to disease by reviewing their family history. Patients will gain insight into the importance of diet as part of an overall healthy lifestyle in mitigating the impact of genetics and other environmental insults. The purpose of this lesson is to provide patients with the opportunity to assess their personal nutrition priorities by looking at their family history, their own health history and current risk factors. Patients will also be able to discuss ways of prioritizing and modifying the Pritikin Eating Plan for their highest risk areas  Menu  Clinical staff led group instruction and group discussion with PowerPoint presentation and patient guidebook. To enhance the learning environment the use of posters, models and videos may be added. Using menus brought in from [employer], or printed from [employer], patients will apply the Pritikin dining out guidelines that were presented in the  [employer] video. Patients will also be able to practice these guidelines in  a variety of provided scenarios. The purpose of this lesson is to provide patients with the opportunity to practice hands-on learning of the Pritikin Dining Out guidelines with actual menus and practice scenarios.  Label Reading Clinical staff led group instruction and group discussion with PowerPoint presentation and patient guidebook. To enhance the learning environment the use of posters, models and videos may be added. Patients will review and discuss the Pritikin label reading guidelines presented in Pritikin's Label Reading Educational series video. Using fool labels brought in from local grocery stores and markets, patients will apply the label reading guidelines and determine if the packaged food meet the Pritikin guidelines. The purpose of this lesson is to provide patients with the opportunity to review, discuss, and practice hands-on learning of the Pritikin Label Reading guidelines with actual packaged food labels. Cooking School  Pritikin's [employer] are designed to teach patients ways to prepare quick, simple, and affordable recipes at home. The importance of nutrition's role in chronic disease risk reduction is reflected in its emphasis in the overall Pritikin program. By learning how to prepare essential core Pritikin Eating Plan recipes, patients will increase control over what they eat; be able to customize the flavor of foods without the use of added salt, sugar, or fat; and improve the quality of the food they consume. By learning a set of core recipes which are easily assembled, quickly prepared, and affordable, patients are more likely to prepare more healthy foods at home. These workshops focus on convenient breakfasts, simple entres, side dishes, and desserts which can be prepared with minimal effort and are consistent with nutrition recommendations for cardiovascular risk  reduction. Cooking [employer] are taught by a Armed forces logistics/support/administrative officer (RD) who has been trained by the [employer]. The chef or RD has a clear understanding of the importance of minimizing - if not completely eliminating - added fat, sugar, and sodium in recipes. Throughout the series of Cooking School Workshop sessions, patients will learn about healthy ingredients and efficient methods of cooking to build confidence in their capability to prepare    Cooking School weekly topics:  Adding Flavor- Sodium-Free  Fast and Healthy Breakfasts  Powerhouse Plant-Based Proteins  Satisfying Salads and Dressings  Simple Sides and Sauces  International Cuisine-Spotlight on the [employer] Zones  Delicious Desserts  Savory Soups  [employer] - Meals in a Astronomer Appetizers and Snacks  Comforting Weekend Breakfasts  One-Pot Wonders   Fast Evening Meals  Landscape architect Your Pritikin Plate  WORKSHOPS   Healthy Mindset (Psychosocial):  Focused Goals, Sustainable Changes Clinical staff led group instruction and group discussion with PowerPoint presentation and patient guidebook. To enhance the learning environment the use of posters, models and videos may be added. Patients will be able to apply effective goal setting strategies to establish at least one personal goal, and then take consistent, meaningful action toward that goal. They will learn to identify common barriers to achieving personal goals and develop strategies to overcome them. Patients will also gain an understanding of how our mind-set can impact our ability to achieve goals and the importance of cultivating a positive and growth-oriented mind-set. The purpose of this lesson is to provide patients with a deeper understanding of how to set and achieve personal goals, as well as the tools and strategies needed to overcome common obstacles which may arise along the way.  From Head to Heart: The  Power of a Psychologist, occupational  led group instruction and group discussion with PowerPoint presentation and patient guidebook. To enhance the learning environment the use of posters, models and videos may be added. Patients will be able to recognize and describe the impact of emotions and mood on physical health. They will discover the importance of self-care and explore self-care practices which may work for them. Patients will also learn how to utilize the 4 C's to cultivate a healthier outlook and better manage stress and challenges. The purpose of this lesson is to demonstrate to patients how a healthy outlook is an essential part of maintaining good health, especially as they continue their cardiac rehab journey.  Healthy Sleep for a Healthy Heart Clinical staff led group instruction and group discussion with PowerPoint presentation and patient guidebook. To enhance the learning environment the use of posters, models and videos may be added. At the conclusion of this workshop, patients will be able to demonstrate knowledge of the importance of sleep to overall health, well-being, and quality of life. They will understand the symptoms of, and treatments for, common sleep disorders. Patients will also be able to identify daytime and nighttime behaviors which impact sleep, and they will be able to apply these tools to help manage sleep-related challenges. The purpose of this lesson is to provide patients with a general overview of sleep and outline the importance of quality sleep. Patients will learn about a few of the most common sleep disorders. Patients will also be introduced to the concept of "sleep hygiene," and discover ways to self-manage certain sleeping problems through simple daily behavior changes. Finally, the workshop will motivate patients by clarifying the links between quality sleep and their goals of heart-healthy living.   Recognizing and Reducing Stress Clinical staff led  group instruction and group discussion with PowerPoint presentation and patient guidebook. To enhance the learning environment the use of posters, models and videos may be added. At the conclusion of this workshop, patients will be able to understand the types of stress reactions, differentiate between acute and chronic stress, and recognize the impact that chronic stress has on their health. They will also be able to apply different coping mechanisms, such as reframing negative self-talk. Patients will have the opportunity to practice a variety of stress management techniques, such as deep abdominal breathing, progressive muscle relaxation, and/or guided imagery.  The purpose of this lesson is to educate patients on the role of stress in their lives and to provide healthy techniques for coping with it.  Learning Barriers/Preferences:  Learning Barriers/Preferences - 08/29/23 1527       Learning Barriers/Preferences   Learning Barriers Hearing    Learning Preferences Skilled Demonstration          Education Topics:  Knowledge Questionnaire Score:   Core Components/Risk Factors/Patient Goals at Admission:   Core Components/Risk Factors/Patient Goals Review:    Core Components/Risk Factors/Patient Goals at Discharge (Final Review):    ITP Comments:   Comments: ***

## 2023-08-29 NOTE — Progress Notes (Signed)
 Cardiac Individual Treatment Plan  Patient Details  Name: William [name] MRN: [id number] Date of Birth: Sep 20, 1950 Referring Provider:   Flowsheet Row INTENSIVE CARDIAC REHAB ORIENT from 08/29/2023 in [facility]  Referring Provider William Fell, MD    Initial Encounter Date:  Flowsheet Row INTENSIVE CARDIAC REHAB ORIENT from 08/29/2023 in [facility]  Date 08/29/23    Visit Diagnosis: S/P TAVR (transcatheter aortic valve replacement)  Patient's Home Medications on Admission:  Current Outpatient Medications:    amLODipine  (NORVASC ) 10 MG tablet, Take 1 tablet (10 mg total) by mouth daily., Disp: 90 tablet, Rfl: 3   ammonium lactate  (AMLACTIN) 12 % cream, Apply 2 times daily to forearms and hands in preparation for treatment with Efudex  (fluorouracil ), Disp: 385 g, Rfl: 99   aspirin EC 81 MG tablet, Take 81 mg by mouth daily. Swallow whole., Disp: , Rfl:    furosemide  (LASIX ) 20 MG tablet, Take 1 tablet (20 mg total) by mouth daily., Disp: 90 tablet, Rfl: 3   Magnesium 400 MG TABS, Take 400 mg by mouth daily., Disp: , Rfl:    metFORMIN  (GLUCOPHAGE ) 500 MG tablet, Take 1 tablet (500 mg total) by mouth 2 (two) times daily with a meal., Disp: 180 tablet, Rfl: 3   metoprolol  tartrate (LOPRESSOR ) 100 MG tablet, Take 1 tablet (100 mg total) by mouth 2 (two) times daily., Disp: 90 tablet, Rfl: 1   mycophenolate  (CELLCEPT ) 250 MG capsule, Take 2 capsules by mouth in the morning and 1 capsule in the evening., Disp: 270 capsule, Rfl: 3   pantoprazole  (PROTONIX ) 40 MG tablet, Take 1 tablet (40 mg total) by mouth daily., Disp: 90 tablet, Rfl: 3   rosuvastatin  (CRESTOR ) 10 MG tablet, Take 1 tablet (10 mg total) by mouth daily., Disp: 90 tablet, Rfl: 3   tacrolimus  (PROGRAF ) 1 MG capsule, Take 2 capsules (2 mg total) by mouth every morning AND 1 capsule (1 mg total) every evening.,  Disp: 270 capsule, Rfl: 3   triamcinolone  cream (KENALOG ) 0.1 %, Apply 2 times a day to rash. Follow with CeraVe Cream.  Stop when smooth., Disp: 80 g, Rfl: 2   acitretin  (SORIATANE ) 10 MG capsule, Take 1 capsule by mouth daily (Patient not taking: Reported on 08/29/2023), Disp: 30 capsule, Rfl: 5 No current facility-administered medications for this encounter.  Facility-Administered Medications Ordered in Other Encounters:    heparin  lock flush 100 unit/mL, 500 Units, Intracatheter, Daily PRN, William Callander, MD   sodium chloride  flush (NS) 0.9 % injection 10 mL, 10 mL, Intracatheter, PRN, William Callander, MD  Past Medical History: Past Medical History:  Diagnosis Date   AAA (abdominal aortic aneurysm) (HCC) 03/2014   repaired 2020   Ascending aortic aneurysm (HCC)    unrepaired. Stable at 4.3 cm on imaging by [facility] thoracic surg 10/2019 and 10/2020.   Bilateral renal cysts    simple (03/2014 MRI)   CAD (coronary artery disease)    Cholelithiases 2018   asymptomatic   Chronic diastolic heart failure (HCC)    Chronic renal insufficiency, stage 3 (moderate) (HCC) 2022   baseline sCr 1.3-1.5 (GFR 55)   COVID-19 virus infection 02/2020   sotrovimab  infusion   Diabetes mellitus with complication (HCC) 07/2014   A1c 6.8%. A1c 5.4% 11/2019   Diverticulosis    Gout    initially dx'd by diag arthrocentesis of elbow effusion in winter 2021, 3 episodes, all came  after being started on chlorthalidone ->I d/c'd chlorthal.   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  Improving + off oxygen  as of 3 mo s/p liver transplant.  Doing well/resolved as of 02/2020 [facility] pulm f/u->they'll continue to follow. 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      Hypertension    Cr bump 04/01/16 so I changed benicar -hct to benicar  plain and added amlodipine  5 mg.   Increased prostate specific antigen (PSA) velocity 2021   0.11 to 4.77 from 2020 to 2021 (he got a liver transplant and was put on antirejection meds between these psa checks).   Iron  deficiency anemia 05/2016   Acute blood loss anemia: hospitalized, required transfusion x 3 U: colonoscopy and capsule study unrevealing.  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  W/u for ? hemolytic anemia to be pursued by hematologist as outpt.  William [name], GI at [facility] following, too---he rec'd onc do bone marrow bx as of Jan 2019   Iron  deficiency anemia due to chronic blood loss 2018/19   GI: transfusions x >20 required; multiple endoscopies and bleeding scans unrevealing. Lysteda  and octreotide  + monthly iron  infusions as of 04/2017. Iron  infusions changed to every other month as of 09/2018 hem f/u.   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  Changes noted on all CXRs in 2018/2019.  Liver Transplant eval 03/2018->mild restriction on PFTs but no obstruction.  See further details in [facility] pulm fibrosis section   Microscopic hematuria    Eval unremarkable by William [name].   Nonmelanoma skin cancer 08/15/2018   2020 BCC nose, excised.  2022 R side of neck->mohs   Obesity    Pulmonary fibrosis (HCC)    PFTs: restrictive lung dz ([facility] Liver transplant eval 05/2018). [facility] pulm eval felt this was likely hepatopulmonary syndrome->causing his hypoxia->low likelihood of progression (as of 10/04/18 transplant clinic f/u). Pulm  rehab helping as of 2020/2021. OFF oxygen  as of 10/2019 transp f/u.   Pulmonary fibrosis (HCC)    Gradual worsening of objective testing 2020 05/2023, has had return of oxygen  requirement, diffuse parenchymal lung disease of unclear etiology.   Spleen enlarged     Tobacco Use: Social History   Tobacco Use  Smoking Status Former   Types: Cigarettes  Smokeless Tobacco Never  Tobacco Comments   QUIT I 1983    Labs: Review Flowsheet  More data exists      Latest Ref Rng & Units 01/13/2022 04/20/2022 07/21/2022 10/24/2022 01/26/2023  Labs for ITP Cardiac and Pulmonary Rehab  Cholestrol 0 - 200 mg/dL 885  - 94  - 90   LDL (calc) 0 - 99 mg/dL 53  - 41  - 40   HDL-C >39.00 mg/dL 74.39  - 74.09  - 75.29  Trlycerides 0.0 - 149.0 mg/dL 820.9  - 863.9  - 871.9   Hemoglobin A1c 5.7 - 6.4 % 0.0 - 7.0 % 4.0 - 5.6 % 4.0 - 5.6 % 6.3  6.3  6.3  6.3  7.8  7.8  7.8  7.8  5.5  5.5  5.5  5.5  5.7  5.7  5.7  5.7  5.6  5.6  5.6  5.6     Details       Multiple values from one day are sorted in reverse-chronological order         Capillary Blood Glucose: Lab Results  Component Value Date   GLUCAP 125 (H) 08/29/2023   GLUCAP 154 (H) 05/11/2017   GLUCAP 109 (H) 12/01/2016   GLUCAP 127 (H) 12/01/2016   GLUCAP 122 (H) 09/23/2016     Exercise Target Goals: Exercise Program Goal: Individual exercise prescription set using results from initial 6 min walk test and THRR while considering  patient's activity barriers and safety.   Exercise Prescription Goal: Initial exercise prescription builds to 30-45 minutes a day of aerobic activity, 2-3 days per week.  Home exercise guidelines will be given to patient during program as part of exercise prescription that the participant will acknowledge.  Activity Barriers & Risk Stratification:  Activity Barriers & Cardiac Risk Stratification - 08/29/23 1624       Activity Barriers & Cardiac Risk Stratification   Activity Barriers Balance Concerns;Joint  Problems;Arthritis;Deconditioning;Shortness of Breath;Neck/Spine Problems    Cardiac Risk Stratification High          6 Minute Walk:  6 Minute Walk     Row Name 08/29/23 1619         6 Minute Walk   Phase Initial  Used Go- Cart and O2     Distance 330 feet     Walk Time 6 minutes     # of Rest Breaks 1  From 2:01 to 6:00     MPH 0.63     METS 0.6     RPE 15     Perceived Dyspnea  3     VO2 Peak 2.2     Symptoms Yes (comment)     Comments Moderate to severe SOB, RPD = 3     Resting HR 66 bpm     Resting BP 110/64     Resting Oxygen  Saturation  95 %  4L O2     Exercise Oxygen  Saturation  during 6 min walk 82 %     Max Ex. HR 80 bpm     Max Ex. BP 128/80     2 Minute Post BP 118/68       Interval HR   1 Minute HR 80     2 Minute HR 79     3 Minute HR 72     4 Minute HR 70     5 Minute HR 66     6 Minute HR 64     2 Minute Post HR 64     Interval Heart Rate? Yes       Interval Oxygen    Interval Oxygen ? Yes     Baseline Oxygen  Saturation % 95 %     1 Minute Oxygen  Saturation % 95 %     1 Minute Liters of Oxygen  8 L     2 Minute Oxygen  Saturation % 83 %  Stopped walk test     2 Minute Liters of Oxygen  8 L     3  Minute Oxygen  Saturation % 82 %  At rest     3 Minute Liters of Oxygen  8 L     4 Minute Oxygen  Saturation % 86 %  AT rest     4 Minute Liters of Oxygen  8 L     5 Minute Oxygen  Saturation % 91 %     5 Minute Liters of Oxygen  8 L     6 Minute Oxygen  Saturation % 95 %     6 Minute Liters of Oxygen  8 L     2 Minute Post Oxygen  Saturation % 97 %     2 Minute Post Liters of Oxygen  8 L        Oxygen  Initial Assessment:   Oxygen  Re-Evaluation:   Oxygen  Discharge (Final Oxygen  Re-Evaluation):   Initial Exercise Prescription:  Initial Exercise Prescription - 08/29/23 1600       Date of Initial Exercise RX and Referring Provider   Date 08/29/23    Referring Provider William Fell, MD    Expected Discharge Date 11/22/23      NuStep   Level 1     SPM 75    Minutes 20    METs 1      Prescription Details   Frequency (times per week) 3    Duration Progress to 30 minutes of continuous aerobic without signs/symptoms of physical distress      Intensity   THRR 40-80% of Max Heartrate 59-118    Ratings of Perceived Exertion 11-13    Perceived Dyspnea 0-4      Progression   Progression Continue progressive overload as per policy without signs/symptoms or physical distress.      Resistance Training   Training Prescription Yes    Weight 2 lbs    Reps 10-15          Perform Capillary Blood Glucose checks as needed.  Exercise Prescription Changes:   Exercise Comments:   Exercise Goals and Review:   Exercise Goals     Row Name 08/29/23 1625             Exercise Goals   Increase Physical Activity Yes       Intervention Provide advice, education, support and counseling about physical activity/exercise needs.;Develop an individualized exercise prescription for aerobic and resistive training based on initial evaluation findings, risk stratification, comorbidities and participant's personal goals.       Expected Outcomes Short Term: Attend rehab on a regular basis to increase amount of physical activity.;Long Term: Add in home exercise to make exercise part of routine and to increase amount of physical activity.;Long Term: Exercising regularly at least 3-5 days a week.       Increase Strength and Stamina Yes       Intervention Provide advice, education, support and counseling about physical activity/exercise needs.;Develop an individualized exercise prescription for aerobic and resistive training based on initial evaluation findings, risk stratification, comorbidities and participant's personal goals.       Expected Outcomes Short Term: Increase workloads from initial exercise prescription for resistance, speed, and METs.;Short Term: Perform resistance training exercises routinely during rehab and add in resistance training  at home;Long Term: Improve cardiorespiratory fitness, muscular endurance and strength as measured by increased METs and functional capacity ( )       Able to understand and use rate of perceived exertion (RPE) scale Yes       Intervention Provide education and explanation on how to use RPE scale       Expected  Outcomes Short Term: Able to use RPE daily in rehab to express subjective intensity level;Long Term:  Able to use RPE to guide intensity level when exercising independently       Able to understand and use Dyspnea scale Yes       Intervention Provide education and explanation on how to use Dyspnea scale       Expected Outcomes Short Term: Able to use Dyspnea scale daily in rehab to express subjective sense of shortness of breath during exertion;Long Term: Able to use Dyspnea scale to guide intensity level when exercising independently       Knowledge and understanding of Target Heart Rate Range (THRR) Yes       Intervention Provide education and explanation of THRR including how the numbers were predicted and where they are located for reference       Expected Outcomes Short Term: Able to state/look up THRR;Long Term: Able to use THRR to govern intensity when exercising independently;Short Term: Able to use daily as guideline for intensity in rehab       Understanding of Exercise Prescription Yes       Intervention Provide education, explanation, and written materials on patient's individual exercise prescription       Expected Outcomes Short Term: Able to explain program exercise prescription;Long Term: Able to explain home exercise prescription to exercise independently          Exercise Goals Re-Evaluation :   Discharge Exercise Prescription (Final Exercise Prescription Changes):   Nutrition:  Target Goals: Understanding of nutrition guidelines, daily intake of sodium 1500mg , cholesterol 200mg , calories 30% from fat and 7% or less from saturated fats, daily to have 5 or more  servings of fruits and vegetables.  Biometrics:  Pre Biometrics - 08/29/23 1100       Pre Biometrics   Waist Circumference 43 inches    Hip Circumference 44.5 inches    Waist to Hip Ratio 0.97 %    Triceps Skinfold 18 mm    % Body Fat 31.2 %    Grip Strength 28 kg    Flexibility 0 in   could not reach   Single Leg Stand 1.2 seconds           Nutrition Therapy Plan and Nutrition Goals:   Nutrition Assessments:  MEDIFICTS Score Key: >=70 Need to make dietary changes  40-70 Heart Healthy Diet <= 40 Therapeutic Level Cholesterol Diet    Picture Your Plate Scores: <59 Unhealthy dietary pattern with much room for improvement. 41-50 Dietary pattern unlikely to meet recommendations for good health and room for improvement. 51-60 More healthful dietary pattern, with some room for improvement.  >60 Healthy dietary pattern, although there may be some specific behaviors that could be improved.    Nutrition Goals Re-Evaluation:   Nutrition Goals Re-Evaluation:   Nutrition Goals Discharge (Final Nutrition Goals Re-Evaluation):   Psychosocial: Target Goals: Acknowledge presence or absence of significant depression and/or stress, maximize coping skills, provide positive support system. Participant is able to verbalize types and ability to use techniques and skills needed for reducing stress and depression.  Initial Review & Psychosocial Screening:  Initial Psych Review & Screening - 08/29/23 1445       Initial Review   Current issues with None Identified      Family Dynamics   Good Support System? Yes      Barriers   Psychosocial barriers to participate in program There are no identifiable barriers or psychosocial needs.  Screening Interventions   Interventions Encouraged to exercise    Expected Outcomes Short Term goal: Utilizing psychosocial counselor, staff and physician to assist with identification of specific Stressors or current issues interfering with  healing process. Setting desired goal for each stressor or current issue identified.;Long Term Goal: Stressors or current issues are controlled or eliminated.;Short Term goal: Identification and review with participant of any Quality of Life or Depression concerns found by scoring the questionnaire.;Long Term goal: The participant improves quality of Life and PHQ9 Scores as seen by post scores and/or verbalization of changes          Quality of Life Scores:  Quality of Life - 08/29/23 1632       Quality of Life   Select Quality of Life      Quality of Life Scores   Health/Function Pre 22.23 %    Socioeconomic Pre 23.58 %    Psych/Spiritual Pre 27.14 %    Family Pre 27 %    GLOBAL Pre 24.24 %         Scores of 19 and below usually indicate a poorer quality of life in these areas.  A difference of  2-3 points is a clinically meaningful difference.  A difference of 2-3 points in the total score of the Quality of Life Index has been associated with significant improvement in overall quality of life, self-image, physical symptoms, and general health in studies assessing change in quality of life.  PHQ-9: Review Flowsheet  More data exists      08/29/2023 08/02/2023 07/06/2023 07/27/2022 02/14/2022  Depression screen PHQ 2/9  Decreased Interest 2 0 0 0 0  Down, Depressed, Hopeless 0 0 0 0 0  PHQ - 2 Score 2 0 0 0 0  Altered sleeping 0 0 - - 0  Tired, decreased energy 2 0 - - 0  Change in appetite 0 0 - - 0  Feeling bad or failure about yourself  0 0 - - 0  Trouble concentrating 0 0 - - 0  Moving slowly or fidgety/restless 0 0 - - 0  Suicidal thoughts 0 0 - - 0  PHQ-9 Score 4 0 - - 0  Difficult doing work/chores Not difficult at all Not difficult at all - - Not difficult at all   Interpretation of Total Score  Total Score Depression Severity:  1-4 = Minimal depression, 5-9 = Mild depression, 10-14 = Moderate depression, 15-19 = Moderately severe depression, 20-27 = Severe depression    Psychosocial Evaluation and Intervention:   Psychosocial Re-Evaluation:   Psychosocial Discharge (Final Psychosocial Re-Evaluation):   Vocational Rehabilitation: Provide vocational rehab assistance to qualifying candidates.   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 Participant will state understanding/return demonstration of topics presented.     Core Videos: Exercise    Move It!  Clinical staff conducted group or individual video education with verbal and written material and guidebook.  Patient learns the recommended Pritikin exercise program. Exercise with the goal of living a long, healthy life. Some of the health benefits of exercise include controlled diabetes, healthier blood pressure levels, improved cholesterol levels, improved heart and  lung capacity, improved sleep, and better body composition. Everyone should speak with their doctor before starting or changing an exercise routine.  Biomechanical Limitations Clinical staff conducted group or individual video education with verbal and written material and guidebook.  Patient learns how biomechanical limitations can impact exercise and how we can mitigate and possibly overcome limitations to have an impactful and balanced exercise routine.  Body Composition Clinical staff conducted group or individual video education with verbal and written material and guidebook.  Patient learns that body composition (ratio of muscle mass to fat mass) is a key component to assessing overall fitness, rather than body weight alone. Increased fat mass, especially visceral belly fat, can put us  at increased  risk for metabolic syndrome, type 2 diabetes, heart disease, and even death. It is recommended to combine diet and exercise (cardiovascular and resistance training) to improve your body composition. Seek guidance from your physician and exercise physiologist before implementing an exercise routine.  Exercise Action Plan Clinical staff conducted group or individual video education with verbal and written material and guidebook.  Patient learns the recommended strategies to achieve and enjoy long-term exercise adherence, including variety, self-motivation, self-efficacy, and positive decision making. Benefits of exercise include fitness, good health, weight management, more energy, better sleep, less stress, and overall well-being.  Medical   Heart Disease Risk Reduction Clinical staff conducted group or individual video education with verbal and written material and guidebook.  Patient learns our heart is our most vital organ as it circulates oxygen , nutrients, white blood cells, and hormones throughout the entire body, and carries waste away. Data supports a plant-based eating plan like the Pritikin Program for its effectiveness in slowing progression of and reversing heart disease. The video provides a number of recommendations to address heart disease.   Metabolic Syndrome and Belly Fat  Clinical staff conducted group or individual video education with verbal and written material and guidebook.  Patient learns what metabolic syndrome is, how it leads to heart disease, and how one can reverse it and keep it from coming back. You have metabolic syndrome if you have 3 of the following 5 criteria: abdominal obesity, high blood pressure, high triglycerides, low HDL cholesterol, and high blood sugar.  Hypertension and Heart Disease Clinical staff conducted group or individual video education with verbal and written material and guidebook.  Patient learns that high blood pressure, or hypertension, is  very common in the [place] . Hypertension is largely due to excessive salt intake, but other important risk factors include being overweight, physical inactivity, drinking too much alcohol, smoking, and not eating enough potassium from fruits and vegetables. High blood pressure is a leading risk factor for heart attack, stroke, congestive heart failure, dementia, kidney failure, and premature death. Long-term effects of excessive salt intake include stiffening of the arteries and thickening of heart muscle and organ damage. Recommendations include ways to reduce hypertension and the risk of heart disease.  Diseases of Our Time - Focusing on Diabetes Clinical staff conducted group or individual video education with verbal and written material and guidebook.  Patient learns why the best way to stop diseases of our time is prevention, through food and other lifestyle changes. Medicine (such as prescription pills and surgeries) is often only a Band-Aid on the problem, not a long-term solution. Most common diseases of our time include obesity, type 2 diabetes, hypertension, heart disease, and cancer. The Pritikin Program is recommended and has been proven to help reduce, reverse, and/or prevent the damaging effects of  metabolic syndrome.  Nutrition   Overview of the Pritikin Eating Plan  Clinical staff conducted group or individual video education with verbal and written material and guidebook.  Patient learns about the Pritikin Eating Plan for disease risk reduction. The Pritikin Eating Plan emphasizes a wide variety of unrefined, minimally-processed carbohydrates, like fruits, vegetables, whole grains, and legumes. Go, Caution, and Stop food choices are explained. Plant-based and lean animal proteins are emphasized. Rationale provided for low sodium intake for blood pressure control, low added sugars for blood sugar stabilization, and low added fats and oils for coronary artery disease risk reduction and  weight management.  Calorie Density  Clinical staff conducted group or individual video education with verbal and written material and guidebook.  Patient learns about calorie density and how it impacts the Pritikin Eating Plan. Knowing the characteristics of the food you choose will help you decide whether those foods will lead to weight gain or weight loss, and whether you want to consume more or less of them. Weight loss is usually a side effect of the Pritikin Eating Plan because of its focus on low calorie-dense foods.  Label Reading  Clinical staff conducted group or individual video education with verbal and written material and guidebook.  Patient learns about the Pritikin recommended label reading guidelines and corresponding recommendations regarding calorie density, added sugars, sodium content, and whole grains.  Dining Out - Part 1  Clinical staff conducted group or individual video education with verbal and written material and guidebook.  Patient learns that restaurant meals can be sabotaging because they can be so high in calories, fat, sodium, and/or sugar. Patient learns recommended strategies on how to positively address this and avoid unhealthy pitfalls.  Facts on Fats  Clinical staff conducted group or individual video education with verbal and written material and guidebook.  Patient learns that lifestyle modifications can be just as effective, if not more so, as many medications for lowering your risk of heart disease. A Pritikin lifestyle can help to reduce your risk of inflammation and atherosclerosis (cholesterol build-up, or plaque, in the artery walls). Lifestyle interventions such as dietary choices and physical activity address the cause of atherosclerosis. A review of the types of fats and their impact on blood cholesterol levels, along with dietary recommendations to reduce fat intake is also included.  Nutrition Action Plan  Clinical staff conducted group or  individual video education with verbal and written material and guidebook.  Patient learns how to incorporate Pritikin recommendations into their lifestyle. Recommendations include planning and keeping personal health goals in mind as an important part of their success.  Healthy Mind-Set    Healthy Minds, Bodies, Hearts  Clinical staff conducted group or individual video education with verbal and written material and guidebook.  Patient learns how to identify when they are stressed. Video will discuss the impact of that stress, as well as the many benefits of stress management. Patient will also be introduced to stress management techniques. The way we think, act, and feel has an impact on our hearts.  How Our Thoughts Can Heal Our Hearts  Clinical staff conducted group or individual video education with verbal and written material and guidebook.  Patient learns that negative thoughts can cause depression and anxiety. This can result in negative lifestyle behavior and serious health problems. Cognitive behavioral therapy is an effective method to help control our thoughts in order to change and improve our emotional outlook.  Additional Videos:  Exercise    Improving Performance  Clinical staff conducted group or individual video education with verbal and written material and guidebook.  Patient learns to use a non-linear approach by alternating intensity levels and lengths of time spent exercising to help burn more calories and lose more body fat. Cardiovascular exercise helps improve heart health, metabolism, hormonal balance, blood sugar control, and recovery from fatigue. Resistance training improves strength, endurance, balance, coordination, reaction time, metabolism, and muscle mass. Flexibility exercise improves circulation, posture, and balance. Seek guidance from your physician and exercise physiologist before implementing an exercise routine and learn your capabilities and proper form for  all exercise.  Introduction to Yoga  Clinical staff conducted group or individual video education with verbal and written material and guidebook.  Patient learns about yoga, a discipline of the coming together of mind, breath, and body. The benefits of yoga include improved flexibility, improved range of motion, better posture and core strength, increased lung function, weight loss, and positive self-image. Yoga's heart health benefits include lowered blood pressure, healthier heart rate, decreased cholesterol and triglyceride levels, improved immune function, and reduced stress. Seek guidance from your physician and exercise physiologist before implementing an exercise routine and learn your capabilities and proper form for all exercise.  Medical   Aging: Enhancing Your Quality of Life  Clinical staff conducted group or individual video education with verbal and written material and guidebook.  Patient learns key strategies and recommendations to stay in good physical health and enhance quality of life, such as prevention strategies, having an advocate, securing a Health Care Proxy and Power of Attorney, and keeping a list of medications and system for tracking them. It also discusses how to avoid risk for bone loss.  Biology of Weight Control  Clinical staff conducted group or individual video education with verbal and written material and guidebook.  Patient learns that weight gain occurs because we consume more calories than we burn (eating more, moving less). Even if your body weight is normal, you may have higher ratios of fat compared to muscle mass. Too much body fat puts you at increased risk for cardiovascular disease, heart attack, stroke, type 2 diabetes, and obesity-related cancers. In addition to exercise, following the Pritikin Eating Plan can help reduce your risk.  Decoding Lab Results  Clinical staff conducted group or individual video education with verbal and written material and  guidebook.  Patient learns that lab test reflects one measurement whose values change over time and are influenced by many factors, including medication, stress, sleep, exercise, food, hydration, pre-existing medical conditions, and more. It is recommended to use the knowledge from this video to become more involved with your lab results and evaluate your numbers to speak with your doctor.   Diseases of Our Time - Overview  Clinical staff conducted group or individual video education with verbal and written material and guidebook.  Patient learns that according to the CDC, 50% to 70% of chronic diseases (such as obesity, type 2 diabetes, elevated lipids, hypertension, and heart disease) are avoidable through lifestyle improvements including healthier food choices, listening to satiety cues, and increased physical activity.  Sleep Disorders Clinical staff conducted group or individual video education with verbal and written material and guidebook.  Patient learns how good quality and duration of sleep are important to overall health and well-being. Patient also learns about sleep disorders and how they impact health along with recommendations to address them, including discussing with a physician.  Nutrition  Dining Out - Part 2 Clinical staff conducted group or individual  video education with verbal and written material and guidebook.  Patient learns how to plan ahead and communicate in order to maximize their dining experience in a healthy and nutritious manner. Included are recommended food choices based on the type of restaurant the patient is visiting.   Fueling a Banker conducted group or individual video education with verbal and written material and guidebook.  There is a strong connection between our food choices and our health. Diseases like obesity and type 2 diabetes are very prevalent and are in large-part due to lifestyle choices. The Pritikin Eating Plan  provides plenty of food and hunger-curbing satisfaction. It is easy to follow, affordable, and helps reduce health risks.  Menu Workshop  Clinical staff conducted group or individual video education with verbal and written material and guidebook.  Patient learns that restaurant meals can sabotage health goals because they are often packed with calories, fat, sodium, and sugar. Recommendations include strategies to plan ahead and to communicate with the manager, chef, or server to help order a healthier meal.  Planning Your Eating Strategy  Clinical staff conducted group or individual video education with verbal and written material and guidebook.  Patient learns about the Pritikin Eating Plan and its benefit of reducing the risk of disease. The Pritikin Eating Plan does not focus on calories. Instead, it emphasizes high-quality, nutrient-rich foods. By knowing the characteristics of the foods, we choose, we can determine their calorie density and make informed decisions.  Targeting Your Nutrition Priorities  Clinical staff conducted group or individual video education with verbal and written material and guidebook.  Patient learns that lifestyle habits have a tremendous impact on disease risk and progression. This video provides eating and physical activity recommendations based on your personal health goals, such as reducing LDL cholesterol, losing weight, preventing or controlling type 2 diabetes, and reducing high blood pressure.  Vitamins and Minerals  Clinical staff conducted group or individual video education with verbal and written material and guidebook.  Patient learns different ways to obtain key vitamins and minerals, including through a recommended healthy diet. It is important to discuss all supplements you take with your doctor.   Healthy Mind-Set    Smoking Cessation  Clinical staff conducted group or individual video education with verbal and written material and guidebook.   Patient learns that cigarette smoking and tobacco addiction pose a serious health risk which affects millions of people. Stopping smoking will significantly reduce the risk of heart disease, lung disease, and many forms of cancer. Recommended strategies for quitting are covered, including working with your doctor to develop a successful plan.  Culinary   Becoming a Set designer conducted group or individual video education with verbal and written material and guidebook.  Patient learns that cooking at home can be healthy, cost-effective, quick, and puts them in control. Keys to cooking healthy recipes will include looking at your recipe, assessing your equipment needs, planning ahead, making it simple, choosing cost-effective seasonal ingredients, and limiting the use of added fats, salts, and sugars.  Cooking - Breakfast and Snacks  Clinical staff conducted group or individual video education with verbal and written material and guidebook.  Patient learns how important breakfast is to satiety and nutrition through the entire day. Recommendations include key foods to eat during breakfast to help stabilize blood sugar levels and to prevent overeating at meals later in the day. Planning ahead is also a key component.  Cooking - Public affairs consultant  Clinical staff conducted group or individual video education with verbal and written material and guidebook.  Patient learns eating strategies to improve overall health, including an approach to cook more at home. Recommendations include thinking of animal protein as a side on your plate rather than center stage and focusing instead on lower calorie dense options like vegetables, fruits, whole grains, and plant-based proteins, such as beans. Making sauces in large quantities to freeze for later and leaving the skin on your vegetables are also recommended to maximize your experience.  Cooking - Healthy Salads and Dressing Clinical staff  conducted group or individual video education with verbal and written material and guidebook.  Patient learns that vegetables, fruits, whole grains, and legumes are the foundations of the Pritikin Eating Plan. Recommendations include how to incorporate each of these in flavorful and healthy salads, and how to create homemade salad dressings. Proper handling of ingredients is also covered. Cooking - Soups and [employer] - Soups and Desserts Clinical staff conducted group or individual video education with verbal and written material and guidebook.  Patient learns that Pritikin soups and desserts make for easy, nutritious, and delicious snacks and meal components that are low in sodium, fat, sugar, and calorie density, while high in vitamins, minerals, and filling fiber. Recommendations include simple and healthy ideas for soups and desserts.   Overview     The Pritikin Solution Program Overview Clinical staff conducted group or individual video education with verbal and written material and guidebook.  Patient learns that the results of the Pritikin Program have been documented in more than 100 articles published in peer-reviewed journals, and the benefits include reducing risk factors for (and, in some cases, even reversing) high cholesterol, high blood pressure, type 2 diabetes, obesity, and more! An overview of the three key pillars of the Pritikin Program will be covered: eating well, doing regular exercise, and having a healthy mind-set.  WORKSHOPS  Exercise: Exercise Basics: Building Your Action Plan Clinical staff led group instruction and group discussion with PowerPoint presentation and patient guidebook. To enhance the learning environment the use of posters, models and videos may be added. At the conclusion of this workshop, patients will comprehend the difference between physical activity and exercise, as well as the benefits of incorporating both, into their routine. Patients will  understand the FITT (Frequency, Intensity, Time, and Type) principle and how to use it to build an exercise action plan. In addition, safety concerns and other considerations for exercise and cardiac rehab will be addressed by the presenter. The purpose of this lesson is to promote a comprehensive and effective weekly exercise routine in order to improve patients' overall level of fitness.   Managing Heart Disease: Your Path to a Healthier Heart Clinical staff led group instruction and group discussion with PowerPoint presentation and patient guidebook. To enhance the learning environment the use of posters, models and videos may be added.At the conclusion of this workshop, patients will understand the anatomy and physiology of the heart. Additionally, they will understand how Pritikin's three pillars impact the risk factors, the progression, and the management of heart disease.  The purpose of this lesson is to provide a high-level overview of the heart, heart disease, and how the Pritikin lifestyle positively impacts risk factors.  Exercise Biomechanics Clinical staff led group instruction and group discussion with PowerPoint presentation and patient guidebook. To enhance the learning environment the use of posters, models and videos may be added. Patients will learn how the structural parts of  their bodies function and how these functions impact their daily activities, movement, and exercise. Patients will learn how to promote a neutral spine, learn how to manage pain, and identify ways to improve their physical movement in order to promote healthy living. The purpose of this lesson is to expose patients to common physical limitations that impact physical activity. Participants will learn practical ways to adapt and manage aches and pains, and to minimize their effect on regular exercise. Patients will learn how to maintain good posture while sitting, walking, and lifting.  Balance Training  and Fall Prevention  Clinical staff led group instruction and group discussion with PowerPoint presentation and patient guidebook. To enhance the learning environment the use of posters, models and videos may be added. At the conclusion of this workshop, patients will understand the importance of their sensorimotor skills (vision, proprioception, and the vestibular system) in maintaining their ability to balance as they age. Patients will apply a variety of balancing exercises that are appropriate for their current level of function. Patients will understand the common causes for poor balance, possible solutions to these problems, and ways to modify their physical environment in order to minimize their fall risk. The purpose of this lesson is to teach patients about the importance of maintaining balance as they age and ways to minimize their risk of falling.  WORKSHOPS   Nutrition:  Fueling a Ship broker led group instruction and group discussion with PowerPoint presentation and patient guidebook. To enhance the learning environment the use of posters, models and videos may be added. Patients will review the foundational principles of the Pritikin Eating Plan and understand what constitutes a serving size in each of the food groups. Patients will also learn Pritikin-friendly foods that are better choices when away from home and review make-ahead meal and snack options. Calorie density will be reviewed and applied to three nutrition priorities: weight maintenance, weight loss, and weight gain. The purpose of this lesson is to reinforce (in a group setting) the key concepts around what patients are recommended to eat and how to apply these guidelines when away from home by planning and selecting Pritikin-friendly options. Patients will understand how calorie density may be adjusted for different weight management goals.  Mindful Eating  Clinical staff led group instruction and group  discussion with PowerPoint presentation and patient guidebook. To enhance the learning environment the use of posters, models and videos may be added. Patients will briefly review the concepts of the Pritikin Eating Plan and the importance of low-calorie dense foods. The concept of mindful eating will be introduced as well as the importance of paying attention to internal hunger signals. Triggers for non-hunger eating and techniques for dealing with triggers will be explored. The purpose of this lesson is to provide patients with the opportunity to review the basic principles of the Pritikin Eating Plan, discuss the value of eating mindfully and how to measure internal cues of hunger and fullness using the Hunger Scale. Patients will also discuss reasons for non-hunger eating and learn strategies to use for controlling emotional eating.  Targeting Your Nutrition Priorities Clinical staff led group instruction and group discussion with PowerPoint presentation and patient guidebook. To enhance the learning environment the use of posters, models and videos may be added. Patients will learn how to determine their genetic susceptibility to disease by reviewing their family history. Patients will gain insight into the importance of diet as part of an overall healthy lifestyle in mitigating the impact of genetics  and other environmental insults. The purpose of this lesson is to provide patients with the opportunity to assess their personal nutrition priorities by looking at their family history, their own health history and current risk factors. Patients will also be able to discuss ways of prioritizing and modifying the Pritikin Eating Plan for their highest risk areas  Menu  Clinical staff led group instruction and group discussion with PowerPoint presentation and patient guidebook. To enhance the learning environment the use of posters, models and videos may be added. Using menus brought in from [employer],  or printed from [employer], patients will apply the Pritikin dining out guidelines that were presented in the [employer] video. Patients will also be able to practice these guidelines in a variety of provided scenarios. The purpose of this lesson is to provide patients with the opportunity to practice hands-on learning of the Pritikin Dining Out guidelines with actual menus and practice scenarios.  Label Reading Clinical staff led group instruction and group discussion with PowerPoint presentation and patient guidebook. To enhance the learning environment the use of posters, models and videos may be added. Patients will review and discuss the Pritikin label reading guidelines presented in Pritikin's Label Reading Educational series video. Using fool labels brought in from local grocery stores and markets, patients will apply the label reading guidelines and determine if the packaged food meet the Pritikin guidelines. The purpose of this lesson is to provide patients with the opportunity to review, discuss, and practice hands-on learning of the Pritikin Label Reading guidelines with actual packaged food labels. Cooking School  Pritikin's [employer] are designed to teach patients ways to prepare quick, simple, and affordable recipes at home. The importance of nutrition's role in chronic disease risk reduction is reflected in its emphasis in the overall Pritikin program. By learning how to prepare essential core Pritikin Eating Plan recipes, patients will increase control over what they eat; be able to customize the flavor of foods without the use of added salt, sugar, or fat; and improve the quality of the food they consume. By learning a set of core recipes which are easily assembled, quickly prepared, and affordable, patients are more likely to prepare more healthy foods at home. These workshops focus on convenient breakfasts, simple entres, side dishes, and desserts which  can be prepared with minimal effort and are consistent with nutrition recommendations for cardiovascular risk reduction. Cooking [employer] are taught by a Armed forces logistics/support/administrative officer (RD) who has been trained by the [employer]. The chef or RD has a clear understanding of the importance of minimizing - if not completely eliminating - added fat, sugar, and sodium in recipes. Throughout the series of Cooking School Workshop sessions, patients will learn about healthy ingredients and efficient methods of cooking to build confidence in their capability to prepare    Cooking School weekly topics:  Adding Flavor- Sodium-Free  Fast and Healthy Breakfasts  Powerhouse Plant-Based Proteins  Satisfying Salads and Dressings  Simple Sides and Sauces  International Cuisine-Spotlight on the [employer] Zones  Delicious Desserts  Savory Soups  [employer] - Meals in a Astronomer Appetizers and Snacks  Comforting Weekend Breakfasts  One-Pot Wonders   Fast Evening Meals  Landscape architect Your Pritikin Plate  WORKSHOPS   Healthy Mindset (Psychosocial):  Focused Goals, Sustainable Changes Clinical staff led group instruction and group discussion with PowerPoint presentation and patient guidebook. To enhance the learning environment the use of posters,  models and videos may be added. Patients will be able to apply effective goal setting strategies to establish at least one personal goal, and then take consistent, meaningful action toward that goal. They will learn to identify common barriers to achieving personal goals and develop strategies to overcome them. Patients will also gain an understanding of how our mind-set can impact our ability to achieve goals and the importance of cultivating a positive and growth-oriented mind-set. The purpose of this lesson is to provide patients with a deeper understanding of how to set and achieve personal goals, as well as the tools  and strategies needed to overcome common obstacles which may arise along the way.  From Head to Heart: The Power of a Healthy Outlook  Clinical staff led group instruction and group discussion with PowerPoint presentation and patient guidebook. To enhance the learning environment the use of posters, models and videos may be added. Patients will be able to recognize and describe the impact of emotions and mood on physical health. They will discover the importance of self-care and explore self-care practices which may work for them. Patients will also learn how to utilize the 4 C's to cultivate a healthier outlook and better manage stress and challenges. The purpose of this lesson is to demonstrate to patients how a healthy outlook is an essential part of maintaining good health, especially as they continue their cardiac rehab journey.  Healthy Sleep for a Healthy Heart Clinical staff led group instruction and group discussion with PowerPoint presentation and patient guidebook. To enhance the learning environment the use of posters, models and videos may be added. At the conclusion of this workshop, patients will be able to demonstrate knowledge of the importance of sleep to overall health, well-being, and quality of life. They will understand the symptoms of, and treatments for, common sleep disorders. Patients will also be able to identify daytime and nighttime behaviors which impact sleep, and they will be able to apply these tools to help manage sleep-related challenges. The purpose of this lesson is to provide patients with a general overview of sleep and outline the importance of quality sleep. Patients will learn about a few of the most common sleep disorders. Patients will also be introduced to the concept of "sleep hygiene," and discover ways to self-manage certain sleeping problems through simple daily behavior changes. Finally, the workshop will motivate patients by clarifying the links between  quality sleep and their goals of heart-healthy living.   Recognizing and Reducing Stress Clinical staff led group instruction and group discussion with PowerPoint presentation and patient guidebook. To enhance the learning environment the use of posters, models and videos may be added. At the conclusion of this workshop, patients will be able to understand the types of stress reactions, differentiate between acute and chronic stress, and recognize the impact that chronic stress has on their health. They will also be able to apply different coping mechanisms, such as reframing negative self-talk. Patients will have the opportunity to practice a variety of stress management techniques, such as deep abdominal breathing, progressive muscle relaxation, and/or guided imagery.  The purpose of this lesson is to educate patients on the role of stress in their lives and to provide healthy techniques for coping with it.  Learning Barriers/Preferences:  Learning Barriers/Preferences - 08/29/23 1527       Learning Barriers/Preferences   Learning Barriers Hearing    Learning Preferences Skilled Demonstration          Education Topics:  Knowledge Questionnaire Score:  Knowledge Questionnaire Score - 08/29/23 1636       Knowledge Questionnaire Score   Pre Score 24/24          Core Components/Risk Factors/Patient Goals at Admission:  Personal Goals and Risk Factors at Admission - 08/29/23 1636       Core Components/Risk Factors/Patient Goals on Admission   Improve shortness of breath with ADL's Yes    Intervention Provide education, individualized exercise plan and daily activity instruction to help decrease symptoms of SOB with activities of daily living.    Expected Outcomes Short Term: Improve cardiorespiratory fitness to achieve a reduction of symptoms when performing ADLs;Long Term: Be able to perform more ADLs without symptoms or delay the onset of symptoms    Diabetes Yes    Intervention  Provide education about signs/symptoms and action to take for hypo/hyperglycemia.;Provide education about proper nutrition, including hydration, and aerobic/resistive exercise prescription along with prescribed medications to achieve blood glucose in normal ranges: Fasting glucose 65-99 mg/dL    Expected Outcomes Short Term: Participant verbalizes understanding of the signs/symptoms and immediate care of hyper/hypoglycemia, proper foot care and importance of medication, aerobic/resistive exercise and nutrition plan for blood glucose control.;Long Term: Attainment of HbA1C < 7%.    Hypertension Yes    Intervention Provide education on lifestyle modifcations including regular physical activity/exercise, weight management, moderate sodium restriction and increased consumption of fresh fruit, vegetables, and low fat dairy, alcohol moderation, and smoking cessation.;Monitor prescription use compliance.    Expected Outcomes Short Term: Continued assessment and intervention until BP is < 140/40mm HG in hypertensive participants. < 130/33mm HG in hypertensive participants with diabetes, heart failure or chronic kidney disease.;Long Term: Maintenance of blood pressure at goal levels.    Lipids Yes    Intervention Provide education and support for participant on nutrition & aerobic/resistive exercise along with prescribed medications to achieve LDL 70mg , HDL >40mg .    Expected Outcomes Short Term: Participant states understanding of desired cholesterol values and is compliant with medications prescribed. Participant is following exercise prescription and nutrition guidelines.;Long Term: Cholesterol controlled with medications as prescribed, with individualized exercise RX and with personalized nutrition plan. Value goals: LDL < 70mg , HDL > 40 mg.          Core Components/Risk Factors/Patient Goals Review:    Core Components/Risk Factors/Patient Goals at Discharge (Final Review):    ITP Comments:  ITP  Comments     Row Name 08/29/23 1534           ITP Comments Wilbert Bihari, MD: Medical Director. Introduction to the [facility] / Intensive Cardiac Rehab. Initial oriention packet reviewed with the patient.          Comments: Participant attended orientation for the cardiac rehabilitation program on  08/29/2023  to perform initial intake and exercise walk test. Patient introduced to the [facility] education and orientation packet was reviewed. Completed 6-minute walk test, measurements, initial ITP, and exercise prescription. Vital signs stable. Telemetry-normal sinus rhythm, LBBB, ist degree AVB. Pt symptomatic during walk test with SaO2 as low as 82% on 8L O2 via N/C. Dr. Jesus @ [facility] pulmonary/IDL clinic notified.  Service time was from 1044 to 1305.

## 2023-08-30 ENCOUNTER — Telehealth: Payer: Self-pay | Admitting: Physician Assistant

## 2023-08-30 ENCOUNTER — Other Ambulatory Visit (HOSPITAL_COMMUNITY): Payer: Self-pay

## 2023-08-30 NOTE — Telephone Encounter (Signed)
-----   Message from Nurse Carlette C sent at 08/29/2023  2:45 PM EDT ----- Also sending to Dr Jesus at [facility]  pulmonary/ILD for review. Asked that I also send to local cardiologist Apologize for previous correspondence sent in error. Carlette

## 2023-08-30 NOTE — Telephone Encounter (Signed)
 I am covering William [name]'s inbox, received the following message from William [name] from Cardiac rehab:  William [name] in today for cardiac rehab orientation.  Pt arrives with oxygen  on at 8L on exertion and 4L at rest.  William [name] has pulse ox which reads 95% on 4Lnc sitting down.  This corresponds to our O2 saturation pulse ox.  Pt reports to the nurse that he has noticed that with any exertion even with his oxygen  increased to8L his oxygen  level drops to the mid 80'ss and he at times becomes dizzy and foggy. This rebounds over 30 seconds to minute with rest.  Lung sounds clear to auscultation both anterior and posterior.  Weight stable per pt. Pt sees Dr. Jesus at [facility] for his pulmonology needs and has follow up with him late September. 6 minute walk test completed for the development of the exercise prescription by the Exercise Physiologist.  Pre  HR: 66, BP 110/64, O2 sat 95% on 4L, CBG 125 switched to 8L at the beginning of the walk using high liter flow nasal canula tubing 1 minute  HR 80 O2 sat 95% walking 2 minute HR 79 O2 sat dropped 83%  walking stopped sat in chair  3 minute HR 72  O2 sat 82%  sitting 4 minute  HR 70 O2 sat 86% sitting 5 minute  HR 66  O2 sat 91% sitting 6 minute  HR 64  O2 sat 95% sitting test concluded.  Ambulated 330 feet Post HR 80 BP  128/80  O2 sat 97% RPE 15 dyspnea scale 3 complained of moderate shortness of breath 2 minute post HR 64, BP 118/68  Called and spoke to Dr. Jesus triage nurse at [facility] pulmonary/ILD clinic.  Will send the above note for review.  Asked to also let local cardiology know.  Will hold on continued exercise for any follow up and clearance to proceed. William [name] denies any complaints and will wait to hear from his provider for next steps.   William Bernett RN, BSN [facility] Cardiac and Pulmonary Rehab Nurse Navigator  [phone]     Discussed with Dr. Wonda who agrees patient should primarily see his pulmonologist for evaluation of increased O2  requirement. Would also schedule f/u OV with our office as well -> needs to be seen in person to guide further recs.  Will route to [place] to relay to patient and William.

## 2023-08-30 NOTE — Telephone Encounter (Signed)
 Pt added to Cooper's schedule on 09/04/23 @ 10:20. Pt will be made aware.

## 2023-08-30 NOTE — Telephone Encounter (Signed)
 Call placed to pt and he has been made aware that we have added onto Dr. Margurite schedule 09/04/23 10:20.

## 2023-08-31 ENCOUNTER — Other Ambulatory Visit (HOSPITAL_COMMUNITY): Payer: Self-pay

## 2023-08-31 ENCOUNTER — Other Ambulatory Visit: Payer: Self-pay

## 2023-08-31 MED ORDER — TACROLIMUS 1 MG PO CAPS
ORAL_CAPSULE | ORAL | 3 refills | Status: DC
  Filled 2023-08-31: qty 270, 90d supply, fill #0
  Filled 2023-12-20: qty 270, 90d supply, fill #1

## 2023-09-04 ENCOUNTER — Ambulatory Visit: Attending: Cardiovascular Disease | Admitting: Cardiovascular Disease

## 2023-09-04 ENCOUNTER — Ambulatory Visit (HOSPITAL_COMMUNITY)

## 2023-09-04 ENCOUNTER — Telehealth (HOSPITAL_COMMUNITY): Payer: Self-pay | Admitting: *Deleted

## 2023-09-04 ENCOUNTER — Encounter: Payer: Self-pay | Admitting: Cardiovascular Disease

## 2023-09-04 VITALS — BP 140/70 | HR 64 | Ht 69.0 in | Wt 212.0 lb

## 2023-09-04 DIAGNOSIS — I5032 Chronic diastolic (congestive) heart failure: Secondary | ICD-10-CM

## 2023-09-04 DIAGNOSIS — I350 Nonrheumatic aortic (valve) stenosis: Secondary | ICD-10-CM

## 2023-09-04 DIAGNOSIS — I2510 Atherosclerotic heart disease of native coronary artery without angina pectoris: Secondary | ICD-10-CM | POA: Diagnosis not present

## 2023-09-04 DIAGNOSIS — J9611 Chronic respiratory failure with hypoxia: Secondary | ICD-10-CM

## 2023-09-04 NOTE — Patient Instructions (Signed)
 Follow-Up: At [facility], you and your health needs are our priority.  As part of our continuing mission to provide you with exceptional heart care, our providers are all part of one team.  This team includes your primary Cardiologist (physician) and Advanced Practice Providers or APPs (Physician Assistants and Nurse Practitioners) who all work together to provide you with the care you need, when you need it.  Your next appointment:   6 Months  Provider:   Glendia Ferrier, PA-C

## 2023-09-04 NOTE — Progress Notes (Signed)
 Cardiology Office Note:    Date:  09/04/2023   ID:  Rhythm, Wigfall 1950-11-13, MRN [id number]  PCP:  William Aleene DEL, MD   [facility] Providers Cardiologist:  [name], MD     Referring MD: William Aleene DEL, MD   No chief complaint on file.   History of Present Illness:    William [name] is a 73 y.o. male with a hx of:  Coronary artery disease  S/p CABG in 2008 Myoview  05/04/2016: Normal perfusion, EF 66; low risk R/LHC 05/10/2023 ([facility]): mod pulm HTN, 1/3 grafts patent >> LAD ostial 80, proximal 100, D1 80; LCx ostial 50, proximal 80, distal 100; RI 80; RCA ostial 50, proximal 100; LIMA-LAD patent, SVG-RI 100, SVG-PDA 100; PCWP 22, mean PA 35  (HFpEF) heart failure with preserved ejection fraction  Aortic stenosis s/p TAVR ([facility]) 06/27/23 TTE 06/28/23 ([facility]) - post TAVR: EF > 55, NL RVSF, mild MR, trivial PI, mild TR, RVSP 53 mmHg, TAVR w mean 4 mmHg, no PVL Cardiac MRA 04/19/22 ([facility]): Ao root 4 cm, ascending aorta 3.8 cm Pulmonary hypertension  Obesity  Metabolic syndrome Chronic kidney disease Hyperkalemia >> ARB (Irbesartan ) stopped  Hypertension Hyperlipidemia  Anemia GI blood loss Cirrhosis  S/p Liver Tx at [facility] 2021 Abdominal aortic aneurysm  S/p EVAR at [facility] 2020 Ascending aortic aneurysm CT 04/2018: 3.9 cm MRA 10/2019 ([facility]): Aortic root 4 cm, ascending aorta 3.9 cm MRA 10/27/2020 ([facility]): Root 4 cm, ascending aorta 3.8 cm Pulmonary fibrosis Followed by Pulmonology at [facility]       Patient recently underwent TAVR in May 2025.  Prior to that, he reported exertional chest burning.  This has completely resolved since he underwent TAVR.  However, he has not appreciated any improvement in his breathing.  He was referred for cardiac rehab and at his initial visit was noted to have a high O2 requirement at rest and with exercise.  He had significant functional limitation from his breathing.  He was asked to follow-up with cardiology and pulmonology  before participating further with cardiac rehab.  The patient is here with his son today.  He reports stable shortness of breath.  He is short of breath with almost any physical activity.  He denies orthopnea, PND, or leg swelling.  He has had no further chest pain or pressure.  He reports no fevers, chills, or changes in cough.  As outlined above, he had an excellent result with TAVR with a postprocedural mean gradient of 4 mmHg and no paravalvular regurgitation.  Current Medications: Current Meds  Medication Sig   acitretin  (SORIATANE ) 10 MG capsule Take 1 capsule by mouth daily   amLODipine  (NORVASC ) 10 MG tablet Take 1 tablet (10 mg total) by mouth daily.   ammonium lactate  (AMLACTIN) 12 % cream Apply 2 times daily to forearms and hands in preparation for treatment with Efudex  (fluorouracil )   aspirin EC 81 MG tablet Take 81 mg by mouth daily. Swallow whole.   furosemide  (LASIX ) 20 MG tablet Take 1 tablet (20 mg total) by mouth daily.   Magnesium 400 MG TABS Take 400 mg by mouth daily.   metFORMIN  (GLUCOPHAGE ) 500 MG tablet Take 1 tablet (500 mg total) by mouth 2 (two) times daily with a meal.   metoprolol  tartrate (LOPRESSOR ) 100 MG tablet Take 1 tablet (100 mg total) by mouth 2 (two) times daily.   mycophenolate  (CELLCEPT ) 250 MG capsule Take 2 capsules by mouth in the morning and 1 capsule in the  evening.   pantoprazole  (PROTONIX ) 40 MG tablet Take 1 tablet (40 mg total) by mouth daily.   rosuvastatin  (CRESTOR ) 10 MG tablet Take 1 tablet (10 mg total) by mouth daily.   tacrolimus  (PROGRAF ) 1 MG capsule Take 2 capsules (2 mg total) by mouth every morning AND 1 capsule (1 mg total) every evening.   triamcinolone  cream (KENALOG ) 0.1 % Apply 2 times a day to rash. Follow with CeraVe Cream.  Stop when smooth.     Allergies:   Niacinamide, Thiazide-type diuretics, and Quinolones   ROS:   Please see the history of present illness.    All other systems reviewed and are  negative.  EKGs/Labs/Other Studies Reviewed:    The following studies were reviewed today: Cardiac Studies & Procedures   ______________________________________________________________________________________________     ECHOCARDIOGRAM  ECHOCARDIOGRAM COMPLETE 10/21/2020  Narrative ECHOCARDIOGRAM REPORT    Patient Name:   William [name] Date of Exam: 10/21/2020 Medical Rec #:  [id number]       Height:       68.0 in Accession #:    [id number]      Weight:       233.6 lb Date of Birth:  [birth year]        BSA:          2.183 m Patient Age:    70 years        BP:           127/80 mmHg Patient Gender: M               HR:           63 bpm. Exam Location:  [address]  Procedure: 2D Echo, Cardiac Doppler, Color Doppler and Intracardiac Opacification Agent  Indications:    I50.31 Acute diastolic (congestive) heart failure; I35.0 Nonrheumatic aortic (valve) stenosis  History:        Patient has prior history of Echocardiogram examinations, most recent 10/11/2017. CAD, Prior CABG; Risk Factors:Hypertension and Dyslipidemia. Morbid obesity. Chronic kidney disease. AAA.  Sonographer:    Jon Hacker RCS Referring Phys: [id number] JESSE M CLEAVER  IMPRESSIONS   1. Left ventricular ejection fraction, by estimation, is 60 to 65%. The left ventricle has normal function. The left ventricle has no regional wall motion abnormalities. Left ventricular diastolic parameters were normal. 2. Right ventricular systolic function is normal. The right ventricular size is normal. 3. The mitral valve is grossly normal. Trivial mitral valve regurgitation. 4. The aortic valve is calcified. There is moderate calcification of the aortic valve. There is moderate thickening of the aortic valve. Aortic valve regurgitation is trivial.  FINDINGS Left Ventricle: Left ventricular ejection fraction, by estimation, is 60 to 65%. The left ventricle has normal function. The left ventricle has no regional wall motion  abnormalities. The left ventricular internal cavity size was normal in size. There is no left ventricular hypertrophy. Left ventricular diastolic parameters were normal.  Right Ventricle: The right ventricular size is normal. Right vetricular wall thickness was not well visualized. Right ventricular systolic function is normal.  Left Atrium: Left atrial size was normal in size.  Right Atrium: Right atrial size was normal in size.  Pericardium: There is no evidence of pericardial effusion.  Mitral Valve: The mitral valve is grossly normal. Trivial mitral valve regurgitation.  Tricuspid Valve: The tricuspid valve is normal in structure. Tricuspid valve regurgitation is not demonstrated.  Aortic Valve: The aortic valve is calcified. There is moderate calcification of the aortic valve. There is  moderate thickening of the aortic valve. There is moderate aortic valve annular calcification. Aortic valve regurgitation is trivial. Aortic valve mean gradient measures 14.0 mmHg. Aortic valve peak gradient measures 24.0 mmHg. Aortic valve area, by VTI measures 1.73 cm.  Pulmonic Valve: The pulmonic valve was normal in structure. Pulmonic valve regurgitation is not visualized.  Aorta: The aortic root and ascending aorta are structurally normal, with no evidence of dilitation.  IAS/Shunts: The atrial septum is grossly normal.   LEFT VENTRICLE PLAX 2D LVIDd:         4.80 cm  Diastology LVIDs:         3.40 cm  LV e' medial:    7.40 cm/s LV PW:         1.00 cm  LV E/e' medial:  12.2 LV IVS:        1.10 cm  LV e' lateral:   8.49 cm/s LVOT diam:     2.20 cm  LV E/e' lateral: 10.6 LV SV:         99 LV SV Index:   45 LVOT Area:     3.80 cm   RIGHT VENTRICLE RV S prime:     8.70 cm/s  LEFT ATRIUM             Index       RIGHT ATRIUM           Index LA diam:        3.90 cm 1.79 cm/m  RA Area:     15.30 cm LA Vol (A2C):   78.2 ml 35.82 ml/m RA Volume:   37.50 ml  17.18 ml/m LA Vol (A4C):    48.4 ml 22.17 ml/m LA Biplane Vol: 64.1 ml 29.36 ml/m AORTIC VALVE AV Area (Vmax):    1.63 cm AV Area (Vmean):   1.59 cm AV Area (VTI):     1.73 cm AV Vmax:           245.00 cm/s AV Vmean:          172.500 cm/s AV VTI:            0.574 m AV Peak Grad:      24.0 mmHg AV Mean Grad:      14.0 mmHg LVOT Vmax:         105.00 cm/s LVOT Vmean:        72.300 cm/s LVOT VTI:          0.261 m LVOT/AV VTI ratio: 0.45  AORTA Ao Root diam: 3.90 cm Ao Asc diam:  3.80 cm  MITRAL VALVE MV Area (PHT): 2.99 cm     SHUNTS MV Decel Time: 254 msec     Systemic VTI:  0.26 m MV E velocity: 90.10 cm/s   Systemic Diam: 2.20 cm MV A velocity: 109.00 cm/s MV E/A ratio:  0.83  Aleene Passe MD Electronically signed by Aleene Passe MD Signature Date/Time: 10/21/2020/1:27:42 PM    Final          ______________________________________________________________________________________________      EKG:        Recent Labs: 07/05/2023: ALT 11; BUN 10; Creatinine 1.2; Hemoglobin 13.7; Platelets 367; Potassium 5.5; Sodium 136  Recent Lipid Panel    Component Value Date/Time   CHOL 90 01/26/2023 1009   CHOL 118 10/29/2021 0807   CHOL 135 11/29/2013 0940   TRIG 128.0 01/26/2023 1009   TRIG 177 (H) 11/29/2013 0940   HDL 24.70 (L) 01/26/2023 1009   HDL 26 (L) 10/29/2021  9192   HDL 34 (L) 11/29/2013 0940   CHOLHDL 4 01/26/2023 1009   VLDL 25.6 01/26/2023 1009   LDLCALC 40 01/26/2023 1009   LDLCALC 65 10/29/2021 0807   LDLCALC 66 11/29/2013 0940   LDLDIRECT 162.0 01/13/2021 0933           Physical Exam:    VS:  BP (!) 140/70   Pulse 64   Ht 5' 9 (1.753 m)   Wt 212 lb (96.2 kg)   SpO2 94%   BMI 31.31 kg/m     Wt Readings from Last 3 Encounters:  09/04/23 212 lb (96.2 kg)  08/29/23 213 lb 13.5 oz (97 kg)  08/07/23 214 lb (97.1 kg)     GEN:  Well nourished, well developed male on O2, in no acute distress HEENT: Normal NECK: No JVD; No carotid bruits LYMPHATICS: No  lymphadenopathy CARDIAC: RRR, no murmurs, rubs, gallops RESPIRATORY:  inspiratory rales bilaterally ABDOMEN: Soft, non-tender, non-distended MUSCULOSKELETAL:  No edema; No deformity  SKIN: Warm and dry NEUROLOGIC:  Alert and oriented x 3 PSYCHIATRIC:  Normal affect   Assessment & Plan Nonrheumatic aortic valve stenosis The patient did well with TAVR and had an uncomplicated course.  His postoperative echo is reviewed through care everywhere.  His mean gradient is 4 mmHg no paravalvular regurgitation.  He appears to have had a very good result from TAVR with resolution of his angina but continued symptoms of chronic respiratory failure related to his underlying lung disease. Coronary artery disease involving native coronary artery of native heart without angina pectoris Stable with resolution of angina after TAVR.  Continue aspirin for antiplatelet therapy. Chronic heart failure with preserved ejection fraction (HCC) Patient does not appear volume overloaded on exam.  He continues on furosemide  20 mg daily. Chronic respiratory failure with hypoxia (HCC) Noted to have a significant O2 requirement.  He has follow-up with Dr. Jesus with [facility] pulmonary medicine in September.  While his chronic HFpEF may play some role in exertional dyspnea, I think his chronic pulmonary disease is causing his primary functional limitation and certainly is driving his need for supplemental oxygen .  Would continue his current cardiac medications and hold off on further rehab until he has pulmonary follow-up.  I talked to him about doing some walking indoors, modifying activity based on his shortness of breath and oxygen  levels, and trying to stay as active as possible with short daily walks on his treadmill at home.            Medication Adjustments/Labs and Tests Ordered: Current medicines are reviewed at length with the patient today.  Concerns regarding medicines are outlined above.  No orders of the  defined types were placed in this encounter.  No orders of the defined types were placed in this encounter.   Patient Instructions  Follow-Up: At [facility], you and your health needs are our priority.  As part of our continuing mission to provide you with exceptional heart care, our providers are all part of one team.  This team includes your primary Cardiologist (physician) and Advanced Practice Providers or APPs (Physician Assistants and Nurse Practitioners) who all work together to provide you with the care you need, when you need it.  Your next appointment:   6 Months  Provider:   Glendia Ferrier, PA-C        Signed, [name], MD  09/04/2023 11:46 AM    [facility]

## 2023-09-04 NOTE — Telephone Encounter (Signed)
 Called and spoke to William [name]'s wife, William [name] who is listed on his DPR.  Aware to continue to hold on returning to CR until seen by pulmonary.  William [name] sent a [employer] today as well as I sent over a fax to the office asking for sooner follow up appt than September.  Will keep each other updated. Saturnino Bernett PEAK, BSN Cardiac and Emergency planning/management officer ;

## 2023-09-06 ENCOUNTER — Ambulatory Visit (HOSPITAL_COMMUNITY)

## 2023-09-07 DIAGNOSIS — Z953 Presence of xenogenic heart valve: Secondary | ICD-10-CM | POA: Diagnosis not present

## 2023-09-07 DIAGNOSIS — Z7982 Long term (current) use of aspirin: Secondary | ICD-10-CM | POA: Diagnosis not present

## 2023-09-07 DIAGNOSIS — Z7952 Long term (current) use of systemic steroids: Secondary | ICD-10-CM | POA: Diagnosis not present

## 2023-09-07 DIAGNOSIS — Z79621 Long term (current) use of calcineurin inhibitor: Secondary | ICD-10-CM | POA: Diagnosis not present

## 2023-09-07 DIAGNOSIS — K219 Gastro-esophageal reflux disease without esophagitis: Secondary | ICD-10-CM | POA: Diagnosis not present

## 2023-09-07 DIAGNOSIS — D649 Anemia, unspecified: Secondary | ICD-10-CM | POA: Diagnosis not present

## 2023-09-07 DIAGNOSIS — I5032 Chronic diastolic (congestive) heart failure: Secondary | ICD-10-CM | POA: Diagnosis not present

## 2023-09-07 DIAGNOSIS — J849 Interstitial pulmonary disease, unspecified: Secondary | ICD-10-CM | POA: Diagnosis not present

## 2023-09-07 DIAGNOSIS — R0602 Shortness of breath: Secondary | ICD-10-CM | POA: Diagnosis not present

## 2023-09-07 DIAGNOSIS — Z9981 Dependence on supplemental oxygen: Secondary | ICD-10-CM | POA: Diagnosis not present

## 2023-09-07 DIAGNOSIS — D508 Other iron deficiency anemias: Secondary | ICD-10-CM | POA: Diagnosis not present

## 2023-09-07 DIAGNOSIS — D519 Vitamin B12 deficiency anemia, unspecified: Secondary | ICD-10-CM | POA: Diagnosis not present

## 2023-09-07 DIAGNOSIS — Z951 Presence of aortocoronary bypass graft: Secondary | ICD-10-CM | POA: Diagnosis not present

## 2023-09-07 DIAGNOSIS — Z7984 Long term (current) use of oral hypoglycemic drugs: Secondary | ICD-10-CM | POA: Diagnosis not present

## 2023-09-07 DIAGNOSIS — Z85828 Personal history of other malignant neoplasm of skin: Secondary | ICD-10-CM | POA: Diagnosis not present

## 2023-09-07 DIAGNOSIS — K5900 Constipation, unspecified: Secondary | ICD-10-CM | POA: Diagnosis not present

## 2023-09-07 DIAGNOSIS — D849 Immunodeficiency, unspecified: Secondary | ICD-10-CM | POA: Diagnosis not present

## 2023-09-07 DIAGNOSIS — R0689 Other abnormalities of breathing: Secondary | ICD-10-CM | POA: Diagnosis not present

## 2023-09-07 DIAGNOSIS — I272 Pulmonary hypertension, unspecified: Secondary | ICD-10-CM | POA: Diagnosis not present

## 2023-09-07 DIAGNOSIS — R0902 Hypoxemia: Secondary | ICD-10-CM | POA: Diagnosis not present

## 2023-09-07 DIAGNOSIS — J84112 Idiopathic pulmonary fibrosis: Secondary | ICD-10-CM | POA: Diagnosis not present

## 2023-09-07 DIAGNOSIS — R0600 Dyspnea, unspecified: Secondary | ICD-10-CM | POA: Diagnosis not present

## 2023-09-07 DIAGNOSIS — I130 Hypertensive heart and chronic kidney disease with heart failure and stage 1 through stage 4 chronic kidney disease, or unspecified chronic kidney disease: Secondary | ICD-10-CM | POA: Diagnosis not present

## 2023-09-07 DIAGNOSIS — E785 Hyperlipidemia, unspecified: Secondary | ICD-10-CM | POA: Diagnosis not present

## 2023-09-07 DIAGNOSIS — J9621 Acute and chronic respiratory failure with hypoxia: Secondary | ICD-10-CM | POA: Diagnosis not present

## 2023-09-07 DIAGNOSIS — J8410 Pulmonary fibrosis, unspecified: Secondary | ICD-10-CM | POA: Diagnosis not present

## 2023-09-07 DIAGNOSIS — I2729 Other secondary pulmonary hypertension: Secondary | ICD-10-CM | POA: Diagnosis not present

## 2023-09-07 DIAGNOSIS — I10 Essential (primary) hypertension: Secondary | ICD-10-CM | POA: Diagnosis not present

## 2023-09-07 DIAGNOSIS — Z944 Liver transplant status: Secondary | ICD-10-CM | POA: Diagnosis not present

## 2023-09-07 DIAGNOSIS — E1122 Type 2 diabetes mellitus with diabetic chronic kidney disease: Secondary | ICD-10-CM | POA: Diagnosis not present

## 2023-09-07 DIAGNOSIS — T380X5A Adverse effect of glucocorticoids and synthetic analogues, initial encounter: Secondary | ICD-10-CM | POA: Diagnosis not present

## 2023-09-07 DIAGNOSIS — N1831 Chronic kidney disease, stage 3a: Secondary | ICD-10-CM | POA: Diagnosis not present

## 2023-09-07 DIAGNOSIS — R600 Localized edema: Secondary | ICD-10-CM | POA: Diagnosis not present

## 2023-09-07 DIAGNOSIS — I2510 Atherosclerotic heart disease of native coronary artery without angina pectoris: Secondary | ICD-10-CM | POA: Diagnosis not present

## 2023-09-07 DIAGNOSIS — D84821 Immunodeficiency due to drugs: Secondary | ICD-10-CM | POA: Diagnosis not present

## 2023-09-08 ENCOUNTER — Ambulatory Visit (HOSPITAL_COMMUNITY)

## 2023-09-11 ENCOUNTER — Ambulatory Visit (HOSPITAL_COMMUNITY)

## 2023-09-12 ENCOUNTER — Other Ambulatory Visit: Payer: Self-pay

## 2023-09-13 ENCOUNTER — Ambulatory Visit (HOSPITAL_COMMUNITY)

## 2023-09-14 ENCOUNTER — Other Ambulatory Visit: Payer: Self-pay

## 2023-09-14 NOTE — Progress Notes (Signed)
 Specialty Pharmacy Refill Coordination Note  William [name] is a 73 y.o. male contacted today regarding refills of specialty medication(s) Mycophenolate  Mofetil (CELLCEPT )   Patient requested [facility] at [facility] at [place] date: 09/15/23   Medication will be filled on 09/14/23.

## 2023-09-15 ENCOUNTER — Ambulatory Visit (HOSPITAL_COMMUNITY)

## 2023-09-18 ENCOUNTER — Ambulatory Visit (HOSPITAL_COMMUNITY)

## 2023-09-20 ENCOUNTER — Ambulatory Visit (HOSPITAL_COMMUNITY)

## 2023-09-22 ENCOUNTER — Other Ambulatory Visit (HOSPITAL_COMMUNITY): Payer: Self-pay

## 2023-09-22 ENCOUNTER — Ambulatory Visit (HOSPITAL_COMMUNITY)

## 2023-09-25 ENCOUNTER — Telehealth (HOSPITAL_COMMUNITY): Payer: Self-pay

## 2023-09-25 ENCOUNTER — Ambulatory Visit (HOSPITAL_COMMUNITY)

## 2023-09-25 NOTE — Telephone Encounter (Signed)
 Pt insurance is active and benefits verified through [facility] Medicare. Co-pay $0, DED $0/$0 met, out of pocket $0/$0 met, co-insurance 0%. No pre-authorization required. 09/25/2023 @ 2:57pm, spoke with Amy C., REF# [id number].  TCR/ICR? ICR Visit(date of service)limitation? No Can multiple codes be used on the same date of service/visit?(IF ITS A LIMIT) Yes  Is this a lifetime maximum or an annual maximum? Annual Has the member used any of these services to date? No Is there a time limit (weeks/months) on start of program and/or program completion? No

## 2023-09-26 ENCOUNTER — Telehealth (HOSPITAL_COMMUNITY): Payer: Self-pay

## 2023-09-26 NOTE — Telephone Encounter (Signed)
 Called patient to see if he was interested in participating in the Cardiac Rehab Program. Patient will come in for orientation on 8/28 (walk test only) and will attend the 10:15 exercise class.  Sent MyChart message.

## 2023-09-27 ENCOUNTER — Ambulatory Visit (HOSPITAL_COMMUNITY)

## 2023-09-27 DIAGNOSIS — Z944 Liver transplant status: Secondary | ICD-10-CM | POA: Diagnosis not present

## 2023-09-27 DIAGNOSIS — D849 Immunodeficiency, unspecified: Secondary | ICD-10-CM | POA: Diagnosis not present

## 2023-09-29 ENCOUNTER — Ambulatory Visit (HOSPITAL_COMMUNITY)

## 2023-10-02 ENCOUNTER — Ambulatory Visit (HOSPITAL_COMMUNITY)

## 2023-10-04 ENCOUNTER — Ambulatory Visit (HOSPITAL_COMMUNITY)

## 2023-10-05 ENCOUNTER — Encounter (HOSPITAL_COMMUNITY)
Admission: RE | Admit: 2023-10-05 | Discharge: 2023-10-05 | Disposition: A | Discharge status: Home / Self Care | Source: Ambulatory Visit | Attending: Cardiovascular Disease | Admitting: Cardiovascular Disease

## 2023-10-05 ENCOUNTER — Encounter (HOSPITAL_COMMUNITY): Payer: Self-pay

## 2023-10-05 VITALS — BP 138/72 | HR 61 | Ht 69.25 in | Wt 211.9 lb

## 2023-10-05 DIAGNOSIS — Z952 Presence of prosthetic heart valve: Secondary | ICD-10-CM | POA: Insufficient documentation

## 2023-10-05 LAB — GLUCOSE, CAPILLARY: Glucose-Capillary: 200 mg/dL — ABNORMAL HIGH (ref 70–99)

## 2023-10-05 NOTE — Progress Notes (Signed)
 Cardiac Individual Treatment Plan  Patient Details  Name: William [name] MRN: [id number] Date of Birth: 1951-02-02 Referring Provider:   Flowsheet Row INTENSIVE CARDIAC REHAB ORIENT from 10/05/2023 in [facility]  Referring Provider [name], MD    Initial Encounter Date:  Flowsheet Row INTENSIVE CARDIAC REHAB ORIENT from 10/05/2023 in [facility]  Date 10/05/23    Visit Diagnosis: S/P TAVR (transcatheter aortic valve replacement)  Patient's Home Medications on Admission:  Current Outpatient Medications:    amLODipine  (NORVASC ) 10 MG tablet, Take 1 tablet (10 mg total) by mouth daily., Disp: 90 tablet, Rfl: 3   ammonium lactate  (AMLACTIN) 12 % cream, Apply 2 times daily to forearms and hands in preparation for treatment with Efudex  (fluorouracil ) (Patient taking differently: Apply 1 Application topically as needed for dry skin.), Disp: 385 g, Rfl: 99   aspirin EC 81 MG tablet, Take 81 mg by mouth daily. Swallow whole., Disp: , Rfl:    furosemide  (LASIX ) 20 MG tablet, Take 1 tablet (20 mg total) by mouth daily., Disp: 90 tablet, Rfl: 3   Magnesium 400 MG TABS, Take 400 mg by mouth daily., Disp: , Rfl:    metFORMIN  (GLUCOPHAGE ) 500 MG tablet, Take 1 tablet (500 mg total) by mouth 2 (two) times daily with a meal., Disp: 180 tablet, Rfl: 3   metoprolol  tartrate (LOPRESSOR ) 100 MG tablet, Take 1 tablet (100 mg total) by mouth 2 (two) times daily., Disp: 90 tablet, Rfl: 1   mycophenolate  (CELLCEPT ) 250 MG capsule, Take 2 capsules by mouth in the morning and 1 capsule in the evening., Disp: 270 capsule, Rfl: 3   pantoprazole  (PROTONIX ) 40 MG tablet, Take 1 tablet (40 mg total) by mouth daily., Disp: 90 tablet, Rfl: 3   predniSONE  (DELTASONE ) 10 MG tablet, Take 10 mg by mouth daily with breakfast. Taking as prescibed, Disp: , Rfl:    rosuvastatin  (CRESTOR ) 10 MG tablet, Take  1 tablet (10 mg total) by mouth daily., Disp: 90 tablet, Rfl: 3   tacrolimus  (PROGRAF ) 1 MG capsule, Take 2 capsules (2 mg total) by mouth every morning AND 1 capsule (1 mg total) every evening., Disp: 270 capsule, Rfl: 3   acitretin  (SORIATANE ) 10 MG capsule, Take 1 capsule by mouth daily (Patient not taking: Reported on 10/05/2023), Disp: 30 capsule, Rfl: 5   triamcinolone  cream (KENALOG ) 0.1 %, Apply 2 times a day to rash. Follow with CeraVe Cream.  Stop when smooth. (Patient not taking: Reported on 10/05/2023), Disp: 80 g, Rfl: 2 No current facility-administered medications for this encounter.  Facility-Administered Medications Ordered in Other Encounters:    heparin  lock flush 100 unit/mL, 500 Units, Intracatheter, Daily PRN, Lanny Callander, MD   sodium chloride  flush (NS) 0.9 % injection 10 mL, 10 mL, Intracatheter, PRN, Lanny Callander, MD  Past Medical History: Past Medical History:  Diagnosis Date   AAA (abdominal aortic aneurysm) (HCC) 03/2014   repaired 2020   Ascending aortic aneurysm (HCC)    unrepaired. Stable at 4.3 cm on imaging by [facility] thoracic surg 10/2019 and 10/2020.   Bilateral renal cysts    simple (03/2014 MRI)   CAD (coronary artery disease)    Cholelithiases 2018   asymptomatic   Chronic diastolic heart failure (HCC)    Chronic renal insufficiency, stage 3 (moderate) (HCC) 2022   baseline sCr 1.3-1.5 (GFR 55)   COVID-19 virus infection 02/2020   sotrovimab  infusion  Diabetes mellitus with complication (HCC) 07/2014   A1c 6.8%. A1c 5.4% 11/2019   Diverticulosis    Gout    initially dx'd by diag arthrocentesis of elbow effusion in winter 2021, 3 episodes, all came after being started on chlorthalidone ->I d/c'd chlorthal.   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  Improving + off oxygen  as of 3 mo s/p liver transplant.  Doing well/resolved as of 02/2020 [facility] pulm f/u->they'll continue to follow. 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     Hypertension    Cr bump 04/01/16 so I changed benicar -hct to benicar  plain and added amlodipine  5 mg.   Increased prostate specific antigen (PSA) velocity 2021   0.11 to 4.77 from 2020 to 2021 (he got a liver transplant and was put on antirejection meds between these psa checks).   Iron  deficiency anemia 05/2016   Acute blood loss anemia: hospitalized, required transfusion x 3 U: colonoscopy and capsule study unrevealing.  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  W/u for ? hemolytic anemia to be pursued by hematologist as outpt.  Dr. Charlean, GI at [facility] [place] following, too---he rec'd onc do bone marrow bx as of Jan 2019   Iron  deficiency anemia due to chronic blood loss 2018/19   GI: transfusions x >20 required; multiple endoscopies and bleeding scans unrevealing. Lysteda  and octreotide  + monthly iron  infusions as of 04/2017. Iron  infusions changed to every other month as of 09/2018 hem f/u.   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  Changes noted on all CXRs in 2018/2019.  Liver Transplant eval 03/2018->mild restriction on PFTs but no obstruction.  See further details in [facility] pulm fibrosis section   Microscopic hematuria    Eval unremarkable by Dr. Maryanne.   Nonmelanoma skin cancer 08/15/2018   2020 BCC nose, excised.  2022 R side of neck->mohs   Obesity    Pulmonary  fibrosis (HCC)    PFTs: restrictive lung dz ([facility] Liver transplant eval 05/2018). [facility] pulm eval felt this was likely hepatopulmonary syndrome->causing his hypoxia->low likelihood of progression (as of 10/04/18 transplant clinic f/u). Pulm rehab helping as of 2020/2021. OFF oxygen  as of 10/2019 transp f/u.   Pulmonary fibrosis (HCC)    Gradual worsening of objective testing 2020 05/2023, has had return of oxygen  requirement, diffuse parenchymal lung disease of unclear etiology.   Spleen enlarged     Tobacco Use: Social History   Tobacco Use  Smoking Status Former   Types: Cigarettes  Smokeless Tobacco Never  Tobacco Comments   QUIT I 1983    Labs: Review Flowsheet  More data exists      Latest Ref Rng & Units 01/13/2022 04/20/2022 07/21/2022 10/24/2022 01/26/2023  Labs for ITP Cardiac and Pulmonary Rehab  Cholestrol 0 -  200 mg/dL 885  - 94  - 90   LDL (calc) 0 - 99 mg/dL 53  - 41  - 40   HDL-C >39.00 mg/dL 74.39  - 74.09  - 75.29   Trlycerides 0.0 - 149.0 mg/dL 820.9  - 863.9  - 871.9   Hemoglobin A1c 5.7 - 6.4 % 0.0 - 7.0 % 4.0 - 5.6 % 4.0 - 5.6 % 6.3  6.3  6.3  6.3  7.8  7.8  7.8  7.8  5.5  5.5  5.5  5.5  5.7  5.7  5.7  5.7  5.6  5.6  5.6  5.6     Details       Multiple values from one day are sorted in reverse-chronological order         Capillary Blood Glucose: Lab Results  Component Value Date   GLUCAP 200 (H) 10/05/2023   GLUCAP 125 (H) 08/29/2023   GLUCAP 154 (H) 05/11/2017   GLUCAP 109 (H) 12/01/2016   GLUCAP 127 (H) 12/01/2016     Exercise Target Goals: Exercise Program Goal: Individual exercise prescription set using results from initial 6 min walk test and THRR while considering  patient's activity barriers and safety.   Exercise Prescription Goal: Initial exercise prescription builds to 30-45 minutes a day of aerobic activity, 2-3 days per week.  Home exercise guidelines will be given to patient during program as part of exercise prescription that the  participant will acknowledge.  Activity Barriers & Risk Stratification:  Activity Barriers & Cardiac Risk Stratification - 10/05/23 1534       Activity Barriers & Cardiac Risk Stratification   Activity Barriers Balance Concerns;Joint Problems;Arthritis;Deconditioning;Shortness of Breath;Neck/Spine Problems    Cardiac Risk Stratification High          6 Minute Walk:  6 Minute Walk     Row Name 08/29/23 1619 10/05/23 0915       6 Minute Walk   Phase Initial  Used Go- Cart and O2 Initial    Distance 330 feet 766 feet    Walk Time 6 minutes 6 minutes    # of Rest Breaks 1  From 2:01 to 6:00 1  3:10-4:13 to increase O2 to 15L and get SaO2 to 90%    MPH 0.63 1.5    METS 0.6 1.54    RPE 15 14    Perceived Dyspnea  3 3    VO2 Peak 2.2 5.4    Symptoms Yes (comment) Yes (comment)    Comments Moderate to severe SOB, RPD = 3 Moderate to severe SOB, RPD = 3, resolved with rest    Resting HR 66 bpm 61 bpm    Resting BP 110/64 138/72    Resting Oxygen  Saturation  95 %  4L O2 96 %  8L O2    Exercise Oxygen  Saturation  during 6 min walk 82 % 88 %    Max Ex. HR 80 bpm 83 bpm    Max Ex. BP 128/80 150/80    2 Minute Post BP 118/68 124/70      Interval HR   1 Minute HR 80 75    2 Minute HR 79 81    3 Minute HR 72 86    4 Minute HR 70 78    5 Minute HR 66 76    6 Minute HR 64 79    2 Minute Post HR 64 65    Interval Heart Rate? Yes Yes      Interval  Oxygen    Interval Oxygen ? Yes Yes    Baseline Oxygen  Saturation % 95 % 96 %  8L    1 Minute Oxygen  Saturation % 95 % 96 %    1 Minute Liters of Oxygen  8 L 10 L    2 Minute Oxygen  Saturation % 83 %  Stopped walk test 91 %    2 Minute Liters of Oxygen  8 L 10 L    3 Minute Oxygen  Saturation % 82 %  At rest 88 %    3 Minute Liters of Oxygen  8 L 10 L    4 Minute Oxygen  Saturation % 86 %  AT rest 90 %    4 Minute Liters of Oxygen  8 L 15 L    5 Minute Oxygen  Saturation % 91 % 92 %    5 Minute Liters of Oxygen  8 L 15 L    6 Minute Oxygen   Saturation % 95 % 93 %    6 Minute Liters of Oxygen  8 L 15 L    2 Minute Post Oxygen  Saturation % 97 % 99 %    2 Minute Post Liters of Oxygen  8 L 8 L       Oxygen  Initial Assessment:   Oxygen  Re-Evaluation:   Oxygen  Discharge (Final Oxygen  Re-Evaluation):   Initial Exercise Prescription:  Initial Exercise Prescription - 10/05/23 1500       Date of Initial Exercise RX and Referring Provider   Date 10/05/23    Referring Provider [name], MD    Expected Discharge Date 01/01/24      NuStep   Level 1    SPM 75    Minutes 20    METs 1.5      Prescription Details   Frequency (times per week) 3    Duration Progress to 30 minutes of continuous aerobic without signs/symptoms of physical distress      Intensity   THRR 40-80% of Max Heartrate 59-118    Ratings of Perceived Exertion 11-13    Perceived Dyspnea 0-4      Progression   Progression Continue progressive overload as per policy without signs/symptoms or physical distress.      Resistance Training   Training Prescription Yes    Weight 2 lbs    Reps 10-15          Perform Capillary Blood Glucose checks as needed.  Exercise Prescription Changes:   Exercise Comments:   Exercise Goals and Review:   Exercise Goals     Row Name 08/29/23 1625 10/05/23 1534           Exercise Goals   Increase Physical Activity Yes Yes      Intervention Provide advice, education, support and counseling about physical activity/exercise needs.;Develop an individualized exercise prescription for aerobic and resistive training based on initial evaluation findings, risk stratification, comorbidities and participant's personal goals. Provide advice, education, support and counseling about physical activity/exercise needs.;Develop an individualized exercise prescription for aerobic and resistive training based on initial evaluation findings, risk stratification, comorbidities and participant's personal goals.      Expected  Outcomes Short Term: Attend rehab on a regular basis to increase amount of physical activity.;Long Term: Add in home exercise to make exercise part of routine and to increase amount of physical activity.;Long Term: Exercising regularly at least 3-5 days a week. Short Term: Attend rehab on a regular basis to increase amount of physical activity.;Long Term: Add in home exercise to make exercise part of routine and to increase  amount of physical activity.;Long Term: Exercising regularly at least 3-5 days a week.      Increase Strength and Stamina Yes --      Intervention Provide advice, education, support and counseling about physical activity/exercise needs.;Develop an individualized exercise prescription for aerobic and resistive training based on initial evaluation findings, risk stratification, comorbidities and participant's personal goals. Provide advice, education, support and counseling about physical activity/exercise needs.;Develop an individualized exercise prescription for aerobic and resistive training based on initial evaluation findings, risk stratification, comorbidities and participant's personal goals.      Expected Outcomes Short Term: Increase workloads from initial exercise prescription for resistance, speed, and METs.;Short Term: Perform resistance training exercises routinely during rehab and add in resistance training at home;Long Term: Improve cardiorespiratory fitness, muscular endurance and strength as measured by increased METs and functional capacity ( ) Short Term: Increase workloads from initial exercise prescription for resistance, speed, and METs.;Short Term: Perform resistance training exercises routinely during rehab and add in resistance training at home;Long Term: Improve cardiorespiratory fitness, muscular endurance and strength as measured by increased METs and functional capacity ( )      Able to understand and use rate of perceived exertion (RPE) scale Yes Yes       Intervention Provide education and explanation on how to use RPE scale Provide education and explanation on how to use RPE scale      Expected Outcomes Short Term: Able to use RPE daily in rehab to express subjective intensity level;Long Term:  Able to use RPE to guide intensity level when exercising independently Short Term: Able to use RPE daily in rehab to express subjective intensity level;Long Term:  Able to use RPE to guide intensity level when exercising independently      Able to understand and use Dyspnea scale Yes Yes      Intervention Provide education and explanation on how to use Dyspnea scale Provide education and explanation on how to use Dyspnea scale      Expected Outcomes Short Term: Able to use Dyspnea scale daily in rehab to express subjective sense of shortness of breath during exertion;Long Term: Able to use Dyspnea scale to guide intensity level when exercising independently Short Term: Able to use Dyspnea scale daily in rehab to express subjective sense of shortness of breath during exertion;Long Term: Able to use Dyspnea scale to guide intensity level when exercising independently      Knowledge and understanding of Target Heart Rate Range (THRR) Yes Yes      Intervention Provide education and explanation of THRR including how the numbers were predicted and where they are located for reference Provide education and explanation of THRR including how the numbers were predicted and where they are located for reference      Expected Outcomes Short Term: Able to state/look up THRR;Long Term: Able to use THRR to govern intensity when exercising independently;Short Term: Able to use daily as guideline for intensity in rehab Short Term: Able to state/look up THRR;Long Term: Able to use THRR to govern intensity when exercising independently;Short Term: Able to use daily as guideline for intensity in rehab      Understanding of Exercise Prescription Yes Yes      Intervention Provide  education, explanation, and written materials on patient's individual exercise prescription Provide education, explanation, and written materials on patient's individual exercise prescription      Expected Outcomes Short Term: Able to explain program exercise prescription;Long Term: Able to explain home exercise prescription to exercise independently Short Term:  Able to explain program exercise prescription;Long Term: Able to explain home exercise prescription to exercise independently         Exercise Goals Re-Evaluation :   Discharge Exercise Prescription (Final Exercise Prescription Changes):   Nutrition:  Target Goals: Understanding of nutrition guidelines, daily intake of sodium 1500mg , cholesterol 200mg , calories 30% from fat and 7% or less from saturated fats, daily to have 5 or more servings of fruits and vegetables.  Biometrics:  Pre Biometrics - 10/05/23 0930       Pre Biometrics   Height 5' 9.25 (1.759 m)    Weight 96.1 kg    Waist Circumference 43 inches    Hip Circumference 43.5 inches    Waist to Hip Ratio 0.99 %    BMI (Calculated) 31.06    Triceps Skinfold 18 mm    % Body Fat 29.7 %    Grip Strength 29 kg    Flexibility 0 in   Could not reach   Single Leg Stand 1.5 seconds           Nutrition Therapy Plan and Nutrition Goals:   Nutrition Assessments:  MEDIFICTS Score Key: >=70 Need to make dietary changes  40-70 Heart Healthy Diet <= 40 Therapeutic Level Cholesterol Diet   Flowsheet Row INTENSIVE CARDIAC REHAB ORIENT from 10/05/2023 in [facility]ascular[facility]  Picture Your Plate Total Score on Admission 64   Picture Your Plate Scores: <59 Unhealthy dietary pattern with much room for improvement. 41-50 Dietary pattern unlikely to meet recommendations for good health and room for improvement. 51-60 More healthful dietary pattern, with some room for improvement.  >60 Healthy dietary pattern, although  there may be some specific behaviors that could be improved.    Nutrition Goals Re-Evaluation:   Nutrition Goals Re-Evaluation:   Nutrition Goals Discharge (Final Nutrition Goals Re-Evaluation):   Psychosocial: Target Goals: Acknowledge presence or absence of significant depression and/or stress, maximize coping skills, provide positive support system. Participant is able to verbalize types and ability to use techniques and skills needed for reducing stress and depression.  Initial Review & Psychosocial Screening:  Initial Psych Review & Screening - 10/05/23 0900       Initial Review   Current issues with None Identified      Family Dynamics   Good Support System? Yes   Lowen has his wife, 3 children and 3 grandchildren for support     Barriers   Psychosocial barriers to participate in program There are no identifiable barriers or psychosocial needs.      Screening Interventions   Interventions Encouraged to exercise          Quality of Life Scores:  Quality of Life - 10/05/23 1536       Quality of Life Scores   Health/Function Pre 22.23 %    Socioeconomic Pre 23.58 %    Psych/Spiritual Pre 27.14 %    Family Pre 27 %    GLOBAL Pre 24.24 %         Scores of 19 and below usually indicate a poorer quality of life in these areas.  A difference of  2-3 points is a clinically meaningful difference.  A difference of 2-3 points in the total score of the Quality of Life Index has been associated with significant improvement in overall quality of life, self-image, physical symptoms, and general health in studies assessing change in quality of life.  PHQ-9: Review Flowsheet  More data exists  10/05/2023 08/29/2023 08/02/2023 07/06/2023 07/27/2022  Depression screen PHQ 2/9  Decreased Interest 2 2 0 0 0  Down, Depressed, Hopeless 0 0 0 0 0  PHQ - 2 Score 2 2 0 0 0  Altered sleeping 0 0 0 - -  Tired, decreased energy 2 2 0 - -  Change in appetite 0 0 0 - -  Feeling  bad or failure about yourself  0 0 0 - -  Trouble concentrating 0 0 0 - -  Moving slowly or fidgety/restless 0 0 0 - -  Suicidal thoughts 0 0 0 - -  PHQ-9 Score 4 4 0 - -  Difficult doing work/chores Not difficult at all Not difficult at all Not difficult at all - -   Interpretation of Total Score  Total Score Depression Severity:  1-4 = Minimal depression, 5-9 = Mild depression, 10-14 = Moderate depression, 15-19 = Moderately severe depression, 20-27 = Severe depression   Psychosocial Evaluation and Intervention:   Psychosocial Re-Evaluation:   Psychosocial Discharge (Final Psychosocial Re-Evaluation):   Vocational Rehabilitation: Provide vocational rehab assistance to qualifying candidates.   Vocational Rehab Evaluation & Intervention:  Vocational Rehab - 10/05/23 0901       Initial Vocational Rehab Evaluation & Intervention   Assessment shows need for Vocational Rehabilitation No   [employer] is retired and does not need vocational rehab at this time         Education: Education Goals: Education classes will be provided on a weekly basis, covering required topics. Participant will state understanding/return demonstration of topics presented.     Core Videos: Exercise    Move It!  Clinical staff conducted group or individual video education with verbal and written material and guidebook.  Patient learns the recommended Pritikin exercise program. Exercise with the goal of living a long, healthy life. Some of the health benefits of exercise include controlled diabetes, healthier blood pressure levels, improved cholesterol levels, improved heart and lung capacity, improved sleep, and better body composition. Everyone should speak with their doctor before starting or changing an exercise routine.  Biomechanical Limitations Clinical staff conducted group or individual video education with verbal and written material and guidebook.  Patient learns how biomechanical limitations  can impact exercise and how we can mitigate and possibly overcome limitations to have an impactful and balanced exercise routine.  Body Composition Clinical staff conducted group or individual video education with verbal and written material and guidebook.  Patient learns that body composition (ratio of muscle mass to fat mass) is a key component to assessing overall fitness, rather than body weight alone. Increased fat mass, especially visceral belly fat, can put us  at increased risk for metabolic syndrome, type 2 diabetes, heart disease, and even death. It is recommended to combine diet and exercise (cardiovascular and resistance training) to improve your body composition. Seek guidance from your physician and exercise physiologist before implementing an exercise routine.  Exercise Action Plan Clinical staff conducted group or individual video education with verbal and written material and guidebook.  Patient learns the recommended strategies to achieve and enjoy long-term exercise adherence, including variety, self-motivation, self-efficacy, and positive decision making. Benefits of exercise include fitness, good health, weight management, more energy, better sleep, less stress, and overall well-being.  Medical   Heart Disease Risk Reduction Clinical staff conducted group or individual video education with verbal and written material and guidebook.  Patient learns our heart is our most vital organ as it circulates oxygen , nutrients, white blood cells, and hormones  throughout the entire body, and carries waste away. Data supports a plant-based eating plan like the Pritikin Program for its effectiveness in slowing progression of and reversing heart disease. The video provides a number of recommendations to address heart disease.   Metabolic Syndrome and Belly Fat  Clinical staff conducted group or individual video education with verbal and written material and guidebook.  Patient learns what  metabolic syndrome is, how it leads to heart disease, and how one can reverse it and keep it from coming back. You have metabolic syndrome if you have 3 of the following 5 criteria: abdominal obesity, high blood pressure, high triglycerides, low HDL cholesterol, and high blood sugar.  Hypertension and Heart Disease Clinical staff conducted group or individual video education with verbal and written material and guidebook.  Patient learns that high blood pressure, or hypertension, is very common in the [place] . Hypertension is largely due to excessive salt intake, but other important risk factors include being overweight, physical inactivity, drinking too much alcohol, smoking, and not eating enough potassium from fruits and vegetables. High blood pressure is a leading risk factor for heart attack, stroke, congestive heart failure, dementia, kidney failure, and premature death. Long-term effects of excessive salt intake include stiffening of the arteries and thickening of heart muscle and organ damage. Recommendations include ways to reduce hypertension and the risk of heart disease.  Diseases of Our Time - Focusing on Diabetes Clinical staff conducted group or individual video education with verbal and written material and guidebook.  Patient learns why the best way to stop diseases of our time is prevention, through food and other lifestyle changes. Medicine (such as prescription pills and surgeries) is often only a Band-Aid on the problem, not a long-term solution. Most common diseases of our time include obesity, type 2 diabetes, hypertension, heart disease, and cancer. The Pritikin Program is recommended and has been proven to help reduce, reverse, and/or prevent the damaging effects of metabolic syndrome.  Nutrition   Overview of the Pritikin Eating Plan  Clinical staff conducted group or individual video education with verbal and written material and guidebook.  Patient learns about the  Pritikin Eating Plan for disease risk reduction. The Pritikin Eating Plan emphasizes a wide variety of unrefined, minimally-processed carbohydrates, like fruits, vegetables, whole grains, and legumes. Go, Caution, and Stop food choices are explained. Plant-based and lean animal proteins are emphasized. Rationale provided for low sodium intake for blood pressure control, low added sugars for blood sugar stabilization, and low added fats and oils for coronary artery disease risk reduction and weight management.  Calorie Density  Clinical staff conducted group or individual video education with verbal and written material and guidebook.  Patient learns about calorie density and how it impacts the Pritikin Eating Plan. Knowing the characteristics of the food you choose will help you decide whether those foods will lead to weight gain or weight loss, and whether you want to consume more or less of them. Weight loss is usually a side effect of the Pritikin Eating Plan because of its focus on low calorie-dense foods.  Label Reading  Clinical staff conducted group or individual video education with verbal and written material and guidebook.  Patient learns about the Pritikin recommended label reading guidelines and corresponding recommendations regarding calorie density, added sugars, sodium content, and whole grains.  Dining Out - Part 1  Clinical staff conducted group or individual video education with verbal and written material and guidebook.  Patient learns that restaurant meals can  be sabotaging because they can be so high in calories, fat, sodium, and/or sugar. Patient learns recommended strategies on how to positively address this and avoid unhealthy pitfalls.  Facts on Fats  Clinical staff conducted group or individual video education with verbal and written material and guidebook.  Patient learns that lifestyle modifications can be just as effective, if not more so, as many medications for lowering  your risk of heart disease. A Pritikin lifestyle can help to reduce your risk of inflammation and atherosclerosis (cholesterol build-up, or plaque, in the artery walls). Lifestyle interventions such as dietary choices and physical activity address the cause of atherosclerosis. A review of the types of fats and their impact on blood cholesterol levels, along with dietary recommendations to reduce fat intake is also included.  Nutrition Action Plan  Clinical staff conducted group or individual video education with verbal and written material and guidebook.  Patient learns how to incorporate Pritikin recommendations into their lifestyle. Recommendations include planning and keeping personal health goals in mind as an important part of their success.  Healthy Mind-Set    Healthy Minds, Bodies, Hearts  Clinical staff conducted group or individual video education with verbal and written material and guidebook.  Patient learns how to identify when they are stressed. Video will discuss the impact of that stress, as well as the many benefits of stress management. Patient will also be introduced to stress management techniques. The way we think, act, and feel has an impact on our hearts.  How Our Thoughts Can Heal Our Hearts  Clinical staff conducted group or individual video education with verbal and written material and guidebook.  Patient learns that negative thoughts can cause depression and anxiety. This can result in negative lifestyle behavior and serious health problems. Cognitive behavioral therapy is an effective method to help control our thoughts in order to change and improve our emotional outlook.  Additional Videos:  Exercise    Improving Performance  Clinical staff conducted group or individual video education with verbal and written material and guidebook.  Patient learns to use a non-linear approach by alternating intensity levels and lengths of time spent exercising to help burn more  calories and lose more body fat. Cardiovascular exercise helps improve heart health, metabolism, hormonal balance, blood sugar control, and recovery from fatigue. Resistance training improves strength, endurance, balance, coordination, reaction time, metabolism, and muscle mass. Flexibility exercise improves circulation, posture, and balance. Seek guidance from your physician and exercise physiologist before implementing an exercise routine and learn your capabilities and proper form for all exercise.  Introduction to Yoga  Clinical staff conducted group or individual video education with verbal and written material and guidebook.  Patient learns about yoga, a discipline of the coming together of mind, breath, and body. The benefits of yoga include improved flexibility, improved range of motion, better posture and core strength, increased lung function, weight loss, and positive self-image. Yoga's heart health benefits include lowered blood pressure, healthier heart rate, decreased cholesterol and triglyceride levels, improved immune function, and reduced stress. Seek guidance from your physician and exercise physiologist before implementing an exercise routine and learn your capabilities and proper form for all exercise.  Medical   Aging: Enhancing Your Quality of Life  Clinical staff conducted group or individual video education with verbal and written material and guidebook.  Patient learns key strategies and recommendations to stay in good physical health and enhance quality of life, such as prevention strategies, having an advocate, securing a Health Care Proxy and  [employer], and keeping a list of medications and system for tracking them. It also discusses how to avoid risk for bone loss.  Biology of Weight Control  Clinical staff conducted group or individual video education with verbal and written material and guidebook.  Patient learns that weight gain occurs because we consume more  calories than we burn (eating more, moving less). Even if your body weight is normal, you may have higher ratios of fat compared to muscle mass. Too much body fat puts you at increased risk for cardiovascular disease, heart attack, stroke, type 2 diabetes, and obesity-related cancers. In addition to exercise, following the Pritikin Eating Plan can help reduce your risk.  Decoding Lab Results  Clinical staff conducted group or individual video education with verbal and written material and guidebook.  Patient learns that lab test reflects one measurement whose values change over time and are influenced by many factors, including medication, stress, sleep, exercise, food, hydration, pre-existing medical conditions, and more. It is recommended to use the knowledge from this video to become more involved with your lab results and evaluate your numbers to speak with your doctor.   Diseases of Our Time - Overview  Clinical staff conducted group or individual video education with verbal and written material and guidebook.  Patient learns that according to the CDC, 50% to 70% of chronic diseases (such as obesity, type 2 diabetes, elevated lipids, hypertension, and heart disease) are avoidable through lifestyle improvements including healthier food choices, listening to satiety cues, and increased physical activity.  Sleep Disorders Clinical staff conducted group or individual video education with verbal and written material and guidebook.  Patient learns how good quality and duration of sleep are important to overall health and well-being. Patient also learns about sleep disorders and how they impact health along with recommendations to address them, including discussing with a physician.  Nutrition  Dining Out - Part 2 Clinical staff conducted group or individual video education with verbal and written material and guidebook.  Patient learns how to plan ahead and communicate in order to maximize their  dining experience in a healthy and nutritious manner. Included are recommended food choices based on the type of restaurant the patient is visiting.   Fueling a Banker conducted group or individual video education with verbal and written material and guidebook.  There is a strong connection between our food choices and our health. Diseases like obesity and type 2 diabetes are very prevalent and are in large-part due to lifestyle choices. The Pritikin Eating Plan provides plenty of food and hunger-curbing satisfaction. It is easy to follow, affordable, and helps reduce health risks.  Menu Workshop  Clinical staff conducted group or individual video education with verbal and written material and guidebook.  Patient learns that restaurant meals can sabotage health goals because they are often packed with calories, fat, sodium, and sugar. Recommendations include strategies to plan ahead and to communicate with the manager, chef, or server to help order a healthier meal.  Planning Your Eating Strategy  Clinical staff conducted group or individual video education with verbal and written material and guidebook.  Patient learns about the Pritikin Eating Plan and its benefit of reducing the risk of disease. The Pritikin Eating Plan does not focus on calories. Instead, it emphasizes high-quality, nutrient-rich foods. By knowing the characteristics of the foods, we choose, we can determine their calorie density and make informed decisions.  Targeting Your Nutrition Priorities  Clinical staff conducted group  or individual video education with verbal and written material and guidebook.  Patient learns that lifestyle habits have a tremendous impact on disease risk and progression. This video provides eating and physical activity recommendations based on your personal health goals, such as reducing LDL cholesterol, losing weight, preventing or controlling type 2 diabetes, and reducing high  blood pressure.  Vitamins and Minerals  Clinical staff conducted group or individual video education with verbal and written material and guidebook.  Patient learns different ways to obtain key vitamins and minerals, including through a recommended healthy diet. It is important to discuss all supplements you take with your doctor.   Healthy Mind-Set    Smoking Cessation  Clinical staff conducted group or individual video education with verbal and written material and guidebook.  Patient learns that cigarette smoking and tobacco addiction pose a serious health risk which affects millions of people. Stopping smoking will significantly reduce the risk of heart disease, lung disease, and many forms of cancer. Recommended strategies for quitting are covered, including working with your doctor to develop a successful plan.  Culinary   Becoming a Set designer conducted group or individual video education with verbal and written material and guidebook.  Patient learns that cooking at home can be healthy, cost-effective, quick, and puts them in control. Keys to cooking healthy recipes will include looking at your recipe, assessing your equipment needs, planning ahead, making it simple, choosing cost-effective seasonal ingredients, and limiting the use of added fats, salts, and sugars.  Cooking - Breakfast and Snacks  Clinical staff conducted group or individual video education with verbal and written material and guidebook.  Patient learns how important breakfast is to satiety and nutrition through the entire day. Recommendations include key foods to eat during breakfast to help stabilize blood sugar levels and to prevent overeating at meals later in the day. Planning ahead is also a key component.  Cooking - Educational psychologist conducted group or individual video education with verbal and written material and guidebook.  Patient learns eating strategies to improve overall  health, including an approach to cook more at home. Recommendations include thinking of animal protein as a side on your plate rather than center stage and focusing instead on lower calorie dense options like vegetables, fruits, whole grains, and plant-based proteins, such as beans. Making sauces in large quantities to freeze for later and leaving the skin on your vegetables are also recommended to maximize your experience.  Cooking - Healthy Salads and Dressing Clinical staff conducted group or individual video education with verbal and written material and guidebook.  Patient learns that vegetables, fruits, whole grains, and legumes are the foundations of the Pritikin Eating Plan. Recommendations include how to incorporate each of these in flavorful and healthy salads, and how to create homemade salad dressings. Proper handling of ingredients is also covered. Cooking - Soups and [employer] - Soups and Desserts Clinical staff conducted group or individual video education with verbal and written material and guidebook.  Patient learns that Pritikin soups and desserts make for easy, nutritious, and delicious snacks and meal components that are low in sodium, fat, sugar, and calorie density, while high in vitamins, minerals, and filling fiber. Recommendations include simple and healthy ideas for soups and desserts.   Overview     The Pritikin Solution Program Overview Clinical staff conducted group or individual video education with verbal and written material and guidebook.  Patient learns that the results of the  Pritikin Program have been documented in more than 100 articles published in peer-reviewed journals, and the benefits include reducing risk factors for (and, in some cases, even reversing) high cholesterol, high blood pressure, type 2 diabetes, obesity, and more! An overview of the three key pillars of the Pritikin Program will be covered: eating well, doing regular exercise, and having a  healthy mind-set.  WORKSHOPS  Exercise: Exercise Basics: Building Your Action Plan Clinical staff led group instruction and group discussion with PowerPoint presentation and patient guidebook. To enhance the learning environment the use of posters, models and videos may be added. At the conclusion of this workshop, patients will comprehend the difference between physical activity and exercise, as well as the benefits of incorporating both, into their routine. Patients will understand the FITT (Frequency, Intensity, Time, and Type) principle and how to use it to build an exercise action plan. In addition, safety concerns and other considerations for exercise and cardiac rehab will be addressed by the presenter. The purpose of this lesson is to promote a comprehensive and effective weekly exercise routine in order to improve patients' overall level of fitness.   Managing Heart Disease: Your Path to a Healthier Heart Clinical staff led group instruction and group discussion with PowerPoint presentation and patient guidebook. To enhance the learning environment the use of posters, models and videos may be added.At the conclusion of this workshop, patients will understand the anatomy and physiology of the heart. Additionally, they will understand how Pritikin's three pillars impact the risk factors, the progression, and the management of heart disease.  The purpose of this lesson is to provide a high-level overview of the heart, heart disease, and how the Pritikin lifestyle positively impacts risk factors.  Exercise Biomechanics Clinical staff led group instruction and group discussion with PowerPoint presentation and patient guidebook. To enhance the learning environment the use of posters, models and videos may be added. Patients will learn how the structural parts of their bodies function and how these functions impact their daily activities, movement, and exercise. Patients will learn how to  promote a neutral spine, learn how to manage pain, and identify ways to improve their physical movement in order to promote healthy living. The purpose of this lesson is to expose patients to common physical limitations that impact physical activity. Participants will learn practical ways to adapt and manage aches and pains, and to minimize their effect on regular exercise. Patients will learn how to maintain good posture while sitting, walking, and lifting.  Balance Training and Fall Prevention  Clinical staff led group instruction and group discussion with PowerPoint presentation and patient guidebook. To enhance the learning environment the use of posters, models and videos may be added. At the conclusion of this workshop, patients will understand the importance of their sensorimotor skills (vision, proprioception, and the vestibular system) in maintaining their ability to balance as they age. Patients will apply a variety of balancing exercises that are appropriate for their current level of function. Patients will understand the common causes for poor balance, possible solutions to these problems, and ways to modify their physical environment in order to minimize their fall risk. The purpose of this lesson is to teach patients about the importance of maintaining balance as they age and ways to minimize their risk of falling.  WORKSHOPS   Nutrition:  Fueling a Ship broker led group instruction and group discussion with PowerPoint presentation and patient guidebook. To enhance the learning environment the use of posters, models and  videos may be added. Patients will review the foundational principles of the Pritikin Eating Plan and understand what constitutes a serving size in each of the food groups. Patients will also learn Pritikin-friendly foods that are better choices when away from home and review make-ahead meal and snack options. Calorie density will be reviewed and  applied to three nutrition priorities: weight maintenance, weight loss, and weight gain. The purpose of this lesson is to reinforce (in a group setting) the key concepts around what patients are recommended to eat and how to apply these guidelines when away from home by planning and selecting Pritikin-friendly options. Patients will understand how calorie density may be adjusted for different weight management goals.  Mindful Eating  Clinical staff led group instruction and group discussion with PowerPoint presentation and patient guidebook. To enhance the learning environment the use of posters, models and videos may be added. Patients will briefly review the concepts of the Pritikin Eating Plan and the importance of low-calorie dense foods. The concept of mindful eating will be introduced as well as the importance of paying attention to internal hunger signals. Triggers for non-hunger eating and techniques for dealing with triggers will be explored. The purpose of this lesson is to provide patients with the opportunity to review the basic principles of the Pritikin Eating Plan, discuss the value of eating mindfully and how to measure internal cues of hunger and fullness using the Hunger Scale. Patients will also discuss reasons for non-hunger eating and learn strategies to use for controlling emotional eating.  Targeting Your Nutrition Priorities Clinical staff led group instruction and group discussion with PowerPoint presentation and patient guidebook. To enhance the learning environment the use of posters, models and videos may be added. Patients will learn how to determine their genetic susceptibility to disease by reviewing their family history. Patients will gain insight into the importance of diet as part of an overall healthy lifestyle in mitigating the impact of genetics and other environmental insults. The purpose of this lesson is to provide patients with the opportunity to assess their personal  nutrition priorities by looking at their family history, their own health history and current risk factors. Patients will also be able to discuss ways of prioritizing and modifying the Pritikin Eating Plan for their highest risk areas  Menu  Clinical staff led group instruction and group discussion with PowerPoint presentation and patient guidebook. To enhance the learning environment the use of posters, models and videos may be added. Using menus brought in from [employer], or printed from [employer], patients will apply the Pritikin dining out guidelines that were presented in the [employer] video. Patients will also be able to practice these guidelines in a variety of provided scenarios. The purpose of this lesson is to provide patients with the opportunity to practice hands-on learning of the Pritikin Dining Out guidelines with actual menus and practice scenarios.  Label Reading Clinical staff led group instruction and group discussion with PowerPoint presentation and patient guidebook. To enhance the learning environment the use of posters, models and videos may be added. Patients will review and discuss the Pritikin label reading guidelines presented in Pritikin's Label Reading Educational series video. Using fool labels brought in from local grocery stores and markets, patients will apply the label reading guidelines and determine if the packaged food meet the Pritikin guidelines. The purpose of this lesson is to provide patients with the opportunity to review, discuss, and practice hands-on learning of the Pritikin Label Reading  guidelines with actual packaged food labels. Cooking School  Pritikin's [employer] are designed to teach patients ways to prepare quick, simple, and affordable recipes at home. The importance of nutrition's role in chronic disease risk reduction is reflected in its emphasis in the overall Pritikin program. By learning how to prepare  essential core Pritikin Eating Plan recipes, patients will increase control over what they eat; be able to customize the flavor of foods without the use of added salt, sugar, or fat; and improve the quality of the food they consume. By learning a set of core recipes which are easily assembled, quickly prepared, and affordable, patients are more likely to prepare more healthy foods at home. These workshops focus on convenient breakfasts, simple entres, side dishes, and desserts which can be prepared with minimal effort and are consistent with nutrition recommendations for cardiovascular risk reduction. Cooking [employer] are taught by a Armed forces logistics/support/administrative officer (RD) who has been trained by the [employer]. The chef or RD has a clear understanding of the importance of minimizing - if not completely eliminating - added fat, sugar, and sodium in recipes. Throughout the series of Cooking School Workshop sessions, patients will learn about healthy ingredients and efficient methods of cooking to build confidence in their capability to prepare    Cooking School weekly topics:  Adding Flavor- Sodium-Free  Fast and Healthy Breakfasts  Powerhouse Plant-Based Proteins  Satisfying Salads and Dressings  Simple Sides and Sauces  International Cuisine-Spotlight on the [employer] Zones  Delicious Desserts  Savory Soups  [employer] - Meals in a Astronomer Appetizers and Snacks  Comforting Weekend Breakfasts  One-Pot Wonders   Fast Evening Meals  Landscape architect Your Pritikin Plate  WORKSHOPS   Healthy Mindset (Psychosocial):  Focused Goals, Sustainable Changes Clinical staff led group instruction and group discussion with PowerPoint presentation and patient guidebook. To enhance the learning environment the use of posters, models and videos may be added. Patients will be able to apply effective goal setting strategies to establish at least one personal goal, and  then take consistent, meaningful action toward that goal. They will learn to identify common barriers to achieving personal goals and develop strategies to overcome them. Patients will also gain an understanding of how our mind-set can impact our ability to achieve goals and the importance of cultivating a positive and growth-oriented mind-set. The purpose of this lesson is to provide patients with a deeper understanding of how to set and achieve personal goals, as well as the tools and strategies needed to overcome common obstacles which may arise along the way.  From Head to Heart: The Power of a Healthy Outlook  Clinical staff led group instruction and group discussion with PowerPoint presentation and patient guidebook. To enhance the learning environment the use of posters, models and videos may be added. Patients will be able to recognize and describe the impact of emotions and mood on physical health. They will discover the importance of self-care and explore self-care practices which may work for them. Patients will also learn how to utilize the 4 C's to cultivate a healthier outlook and better manage stress and challenges. The purpose of this lesson is to demonstrate to patients how a healthy outlook is an essential part of maintaining good health, especially as they continue their cardiac rehab journey.  Healthy Sleep for a Healthy Heart Clinical staff led group instruction and group discussion with PowerPoint presentation and patient guidebook. To enhance the  learning environment the use of posters, models and videos may be added. At the conclusion of this workshop, patients will be able to demonstrate knowledge of the importance of sleep to overall health, well-being, and quality of life. They will understand the symptoms of, and treatments for, common sleep disorders. Patients will also be able to identify daytime and nighttime behaviors which impact sleep, and they will be able to apply these  tools to help manage sleep-related challenges. The purpose of this lesson is to provide patients with a general overview of sleep and outline the importance of quality sleep. Patients will learn about a few of the most common sleep disorders. Patients will also be introduced to the concept of "sleep hygiene," and discover ways to self-manage certain sleeping problems through simple daily behavior changes. Finally, the workshop will motivate patients by clarifying the links between quality sleep and their goals of heart-healthy living.   Recognizing and Reducing Stress Clinical staff led group instruction and group discussion with PowerPoint presentation and patient guidebook. To enhance the learning environment the use of posters, models and videos may be added. At the conclusion of this workshop, patients will be able to understand the types of stress reactions, differentiate between acute and chronic stress, and recognize the impact that chronic stress has on their health. They will also be able to apply different coping mechanisms, such as reframing negative self-talk. Patients will have the opportunity to practice a variety of stress management techniques, such as deep abdominal breathing, progressive muscle relaxation, and/or guided imagery.  The purpose of this lesson is to educate patients on the role of stress in their lives and to provide healthy techniques for coping with it.  Learning Barriers/Preferences:  Learning Barriers/Preferences - 10/05/23 1540       Learning Barriers/Preferences   Learning Barriers Hearing;Sight    Learning Preferences Skilled Demonstration;Group Instruction;Individual Instruction          Education Topics:  Knowledge Questionnaire Score:  Knowledge Questionnaire Score - 10/05/23 0857       Knowledge Questionnaire Score   Pre Score 21/24          Core Components/Risk Factors/Patient Goals at Admission:  Personal Goals and Risk Factors at Admission -  10/05/23 1505       Core Components/Risk Factors/Patient Goals on Admission   Improve shortness of breath with ADL's Yes    Intervention Provide education, individualized exercise plan and daily activity instruction to help decrease symptoms of SOB with activities of daily living.    Expected Outcomes Short Term: Improve cardiorespiratory fitness to achieve a reduction of symptoms when performing ADLs;Long Term: Be able to perform more ADLs without symptoms or delay the onset of symptoms    Intervention Provide education about signs/symptoms and action to take for hypo/hyperglycemia.;Provide education about proper nutrition, including hydration, and aerobic/resistive exercise prescription along with prescribed medications to achieve blood glucose in normal ranges: Fasting glucose 65-99 mg/dL    Expected Outcomes Short Term: Participant verbalizes understanding of the signs/symptoms and immediate care of hyper/hypoglycemia, proper foot care and importance of medication, aerobic/resistive exercise and nutrition plan for blood glucose control.;Long Term: Attainment of HbA1C < 7%.    Intervention Provide education on lifestyle modifcations including regular physical activity/exercise, weight management, moderate sodium restriction and increased consumption of fresh fruit, vegetables, and low fat dairy, alcohol moderation, and smoking cessation.;Monitor prescription use compliance.    Expected Outcomes Short Term: Continued assessment and intervention until BP is < 140/42mm HG in hypertensive participants. <  130/69mm HG in hypertensive participants with diabetes, heart failure or chronic kidney disease.;Long Term: Maintenance of blood pressure at goal levels.    Intervention Provide education and support for participant on nutrition & aerobic/resistive exercise along with prescribed medications to achieve LDL 70mg , HDL >40mg .    Expected Outcomes Short Term: Participant states understanding of desired  cholesterol values and is compliant with medications prescribed. Participant is following exercise prescription and nutrition guidelines.;Long Term: Cholesterol controlled with medications as prescribed, with individualized exercise RX and with personalized nutrition plan. Value goals: LDL < 70mg , HDL > 40 mg.          Core Components/Risk Factors/Patient Goals Review:    Core Components/Risk Factors/Patient Goals at Discharge (Final Review):    ITP Comments:  ITP Comments     Row Name 08/29/23 1534 10/05/23 0756         ITP Comments Wilbert Bihari, MD: Medical Director. Introduction to the [facility]. Initial oriention packet reviewed with the patient. Wilbert Bihari, MD: Medical Director. Introduction to the [facility]. Initial oriention packet reviewed with the patient. Patient brought back for 6 minute walk test today         Comments: Participant attended orientation for the cardiac rehabilitation program on  10/05/2023  to perform initial intake and exercise walk test. Patient introduced to the [facility] education and orientation packet was reviewed. Completed 6-minute walk test, measurements, initial ITP, and exercise prescription. Vital signs stable. Telemetry- normal sinus rhythm, 1st degree AVB, asymptomatic.   Service time was from 7:59 to 9:45.

## 2023-10-05 NOTE — Progress Notes (Signed)
 Cardiac Rehab Medication Review by a Nurse  Does the patient  feel that his/her medications are working for him/her?  yes  Has the patient been experiencing any side effects to the medications prescribed?  no  Does the patient measure his/her own blood pressure or blood glucose at home?  yes   Does the patient have any problems obtaining medications due to transportation or finances?   no  Understanding of regimen: fair Understanding of indications: fair Potential of compliance: excellent    Nurse comments: William [name] is taking his medications as prescribed and has a fair understanding of what his medications are for. William [name] says he has had an increased appetite since he has been taking prednisone . William [name] takes his blood pressures once a day and checks his CBG's daily and check his oxygen  saturations several times a day.    William [name] [facility] 10/05/2023 8:44 AM

## 2023-10-06 ENCOUNTER — Ambulatory Visit (HOSPITAL_COMMUNITY)

## 2023-10-11 ENCOUNTER — Other Ambulatory Visit (HOSPITAL_COMMUNITY): Payer: Self-pay

## 2023-10-11 ENCOUNTER — Ambulatory Visit (HOSPITAL_COMMUNITY)

## 2023-10-11 MED ORDER — AMOXICILLIN 500 MG PO CAPS
2000.0000 mg | ORAL_CAPSULE | Freq: Once | ORAL | 0 refills | Status: AC
  Filled 2023-10-11: qty 4, 1d supply, fill #0

## 2023-10-12 ENCOUNTER — Telehealth (HOSPITAL_COMMUNITY): Payer: Self-pay

## 2023-10-12 NOTE — Telephone Encounter (Signed)
 Called Dr. Almond Conception (Pulmonology office at [facility]) to get an oxymizer ordered for pt. Spoke to RN at office and stated she would order through his DME company.

## 2023-10-13 ENCOUNTER — Ambulatory Visit (HOSPITAL_COMMUNITY)

## 2023-10-13 ENCOUNTER — Encounter (HOSPITAL_COMMUNITY)

## 2023-10-16 ENCOUNTER — Encounter (HOSPITAL_COMMUNITY)
Admission: RE | Admit: 2023-10-16 | Discharge: 2023-10-16 | Disposition: A | Discharge status: Home / Self Care | Source: Ambulatory Visit | Attending: Cardiovascular Disease | Admitting: Cardiovascular Disease

## 2023-10-16 ENCOUNTER — Ambulatory Visit (HOSPITAL_COMMUNITY)

## 2023-10-16 DIAGNOSIS — Z952 Presence of prosthetic heart valve: Secondary | ICD-10-CM | POA: Diagnosis not present

## 2023-10-16 LAB — GLUCOSE, CAPILLARY
Glucose-Capillary: 171 mg/dL — ABNORMAL HIGH (ref 70–99)
Glucose-Capillary: 87 mg/dL (ref 70–99)

## 2023-10-16 NOTE — Progress Notes (Signed)
 Cardiac Individual Treatment Plan  Patient Details  Name: William [name] MRN: [id number] Date of Birth: 06-17-1950 Referring Provider:   Flowsheet Row INTENSIVE CARDIAC REHAB ORIENT from 10/05/2023 in [facility]  Referring Provider [name], MD    Initial Encounter Date:  Flowsheet Row INTENSIVE CARDIAC REHAB ORIENT from 10/05/2023 in [facility]  Date 10/05/23    Visit Diagnosis: S/P TAVR (transcatheter aortic valve replacement)  Patient's Home Medications on Admission:  Current Outpatient Medications:    acitretin  (SORIATANE ) 10 MG capsule, Take 1 capsule by mouth daily (Patient not taking: Reported on 10/05/2023), Disp: 30 capsule, Rfl: 5   amLODipine  (NORVASC ) 10 MG tablet, Take 1 tablet (10 mg total) by mouth daily., Disp: 90 tablet, Rfl: 3   ammonium lactate  (AMLACTIN) 12 % cream, Apply 2 times daily to forearms and hands in preparation for treatment with Efudex  (fluorouracil ) (Patient taking differently: Apply 1 Application topically as needed for dry skin.), Disp: 385 g, Rfl: 99   aspirin EC 81 MG tablet, Take 81 mg by mouth daily. Swallow whole., Disp: , Rfl:    furosemide  (LASIX ) 20 MG tablet, Take 1 tablet (20 mg total) by mouth daily., Disp: 90 tablet, Rfl: 3   Magnesium 400 MG TABS, Take 400 mg by mouth daily., Disp: , Rfl:    metFORMIN  (GLUCOPHAGE ) 500 MG tablet, Take 1 tablet (500 mg total) by mouth 2 (two) times daily with a meal., Disp: 180 tablet, Rfl: 3   metoprolol  tartrate (LOPRESSOR ) 100 MG tablet, Take 1 tablet (100 mg total) by mouth 2 (two) times daily., Disp: 90 tablet, Rfl: 1   mycophenolate  (CELLCEPT ) 250 MG capsule, Take 2 capsules by mouth in the morning and 1 capsule in the evening., Disp: 270 capsule, Rfl: 3   pantoprazole  (PROTONIX ) 40 MG tablet, Take 1 tablet (40 mg total) by mouth daily., Disp: 90 tablet, Rfl: 3   rosuvastatin   (CRESTOR ) 10 MG tablet, Take 1 tablet (10 mg total) by mouth daily., Disp: 90 tablet, Rfl: 3   tacrolimus  (PROGRAF ) 1 MG capsule, Take 2 capsules (2 mg total) by mouth every morning AND 1 capsule (1 mg total) every evening., Disp: 270 capsule, Rfl: 3   triamcinolone  cream (KENALOG ) 0.1 %, Apply 2 times a day to rash. Follow with CeraVe Cream.  Stop when smooth. (Patient not taking: Reported on 10/05/2023), Disp: 80 g, Rfl: 2 No current facility-administered medications for this encounter.  Facility-Administered Medications Ordered in Other Encounters:    heparin  lock flush 100 unit/mL, 500 Units, Intracatheter, Daily PRN, Lanny Callander, MD   sodium chloride  flush (NS) 0.9 % injection 10 mL, 10 mL, Intracatheter, PRN, Lanny Callander, MD  Past Medical History: Past Medical History:  Diagnosis Date   AAA (abdominal aortic aneurysm) (HCC) 03/2014   repaired 2020   Ascending aortic aneurysm (HCC)    unrepaired. Stable at 4.3 cm on imaging by [facility] thoracic surg 10/2019 and 10/2020.   Bilateral renal cysts    simple (03/2014 MRI)   CAD (coronary artery disease)    Cholelithiases 2018   asymptomatic   Chronic diastolic heart failure (HCC)    Chronic renal insufficiency, stage 3 (moderate) (HCC) 2022   baseline sCr 1.3-1.5 (GFR 55)   COVID-19 virus infection 02/2020   sotrovimab  infusion   Diabetes mellitus with complication (HCC) 07/2014   A1c 6.8%. A1c 5.4% 11/2019   Diverticulosis    Gout  initially dx'd by diag arthrocentesis of elbow effusion in winter 2021, 3 episodes, all came after being started on chlorthalidone ->I d/c'd chlorthal.   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  Improving + off oxygen  as of 3 mo s/p liver transplant.  Doing well/resolved as of 02/2020 [facility] pulm f/u->they'll continue to follow. 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     Hypertension    Cr bump 04/01/16 so I changed benicar -hct to benicar  plain and added amlodipine  5 mg.   Increased prostate specific antigen (PSA) velocity 2021   0.11 to 4.77 from 2020 to 2021 (he got a liver transplant and was put on antirejection meds between these psa checks).   Iron  deficiency anemia 05/2016   Acute blood loss anemia: hospitalized, required transfusion x 3 U: colonoscopy and capsule study unrevealing.  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  W/u for ? hemolytic anemia to be pursued by hematologist as outpt.  Dr. Charlean, GI at [facility] following, too---he rec'd onc do bone marrow bx as of Jan 2019   Iron  deficiency anemia due to chronic blood loss 2018/19   GI: transfusions x >20 required; multiple endoscopies and bleeding scans unrevealing. Lysteda  and octreotide  + monthly iron  infusions as of 04/2017. Iron  infusions changed to every other month as of 09/2018 hem f/u.   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  Changes noted on all CXRs in 2018/2019.  Liver Transplant eval 03/2018->mild restriction on PFTs but no obstruction.  See further details in [facility] pulm fibrosis section   Microscopic hematuria    Eval unremarkable by Dr. Maryanne.   Nonmelanoma skin cancer 08/15/2018   2020 BCC nose, excised.  2022 R side of neck->mohs   Obesity    Pulmonary fibrosis (HCC)    PFTs: restrictive lung dz ([facility] Liver transplant eval 05/2018). [facility] pulm eval felt this was  likely hepatopulmonary syndrome->causing his hypoxia->low likelihood of progression (as of 10/04/18 transplant clinic f/u). Pulm rehab helping as of 2020/2021. OFF oxygen  as of 10/2019 transp f/u.   Pulmonary fibrosis (HCC)    Gradual worsening of objective testing 2020 05/2023, has had return of oxygen  requirement, diffuse parenchymal lung disease of unclear etiology.   Spleen enlarged     Tobacco Use: Social History   Tobacco Use  Smoking Status Former   Types: Cigarettes  Smokeless Tobacco Never  Tobacco Comments   QUIT I 1983    Labs: Review Flowsheet  More data exists      Latest Ref Rng & Units 01/13/2022 04/20/2022 07/21/2022 10/24/2022 01/26/2023  Labs for ITP Cardiac and Pulmonary Rehab  Cholestrol 0 - 200 mg/dL 885  - 94  - 90   LDL (calc) 0 - 99 mg/dL 53  - 41  -  40   HDL-C >39.00 mg/dL 74.39  - 74.09  - 75.29   Trlycerides 0.0 - 149.0 mg/dL 820.9  - 863.9  - 871.9   Hemoglobin A1c 5.7 - 6.4 % 0.0 - 7.0 % 4.0 - 5.6 % 4.0 - 5.6 % 6.3  6.3  6.3  6.3  7.8  7.8  7.8  7.8  5.5  5.5  5.5  5.5  5.7  5.7  5.7  5.7  5.6  5.6  5.6  5.6     Details       Multiple values from one day are sorted in reverse-chronological order         Capillary Blood Glucose: Lab Results  Component Value Date   GLUCAP 87 10/16/2023   GLUCAP 171 (H) 10/16/2023   GLUCAP 200 (H) 10/05/2023   GLUCAP 125 (H) 08/29/2023   GLUCAP 154 (H) 05/11/2017     Exercise Target Goals: Exercise Program Goal: Individual exercise prescription set using results from initial 6 min walk test and THRR while considering  patient's activity barriers and safety.   Exercise Prescription Goal: Initial exercise prescription builds to 30-45 minutes a day of aerobic activity, 2-3 days per week.  Home exercise guidelines will be given to patient during program as part of exercise prescription that the participant will acknowledge.  Activity Barriers & Risk Stratification:  Activity Barriers & Cardiac Risk  Stratification - 10/05/23 1534       Activity Barriers & Cardiac Risk Stratification   Activity Barriers Balance Concerns;Joint Problems;Arthritis;Deconditioning;Shortness of Breath;Neck/Spine Problems    Cardiac Risk Stratification High          6 Minute Walk:  6 Minute Walk     Row Name 08/29/23 1619 10/05/23 0915       6 Minute Walk   Phase Initial  Used Go- Cart and O2 Initial    Distance 330 feet 766 feet    Walk Time 6 minutes 6 minutes    # of Rest Breaks 1  From 2:01 to 6:00 1  3:10-4:13 to increase O2 to 15L and get SaO2 to 90%    MPH 0.63 1.5    METS 0.6 1.54    RPE 15 14    Perceived Dyspnea  3 3    VO2 Peak 2.2 5.4    Symptoms Yes (comment) Yes (comment)    Comments Moderate to severe SOB, RPD = 3 Moderate to severe SOB, RPD = 3, resolved with rest    Resting HR 66 bpm 61 bpm    Resting BP 110/64 138/72    Resting Oxygen  Saturation  95 %  4L O2 96 %  8L O2    Exercise Oxygen  Saturation  during 6 min walk 82 % 88 %    Max Ex. HR 80 bpm 83 bpm    Max Ex. BP 128/80 150/80    2 Minute Post BP 118/68 124/70      Interval HR   1 Minute HR 80 75    2 Minute HR 79 81    3 Minute HR 72 86    4 Minute HR 70 78    5 Minute HR 66 76    6 Minute HR 64 79    2 Minute Post HR 64 65    Interval Heart Rate? Yes Yes      Interval Oxygen    Interval Oxygen ? Yes Yes    Baseline Oxygen  Saturation % 95 % 96 %  8L    1  Minute Oxygen  Saturation % 95 % 96 %    1 Minute Liters of Oxygen  8 L 10 L    2 Minute Oxygen  Saturation % 83 %  Stopped walk test 91 %    2 Minute Liters of Oxygen  8 L 10 L    3 Minute Oxygen  Saturation % 82 %  At rest 88 %    3 Minute Liters of Oxygen  8 L 10 L    4 Minute Oxygen  Saturation % 86 %  AT rest 90 %    4 Minute Liters of Oxygen  8 L 15 L    5 Minute Oxygen  Saturation % 91 % 92 %    5 Minute Liters of Oxygen  8 L 15 L    6 Minute Oxygen  Saturation % 95 % 93 %    6 Minute Liters of Oxygen  8 L 15 L    2 Minute Post Oxygen  Saturation % 97 % 99 %     2 Minute Post Liters of Oxygen  8 L 8 L       Oxygen  Initial Assessment:   Oxygen  Re-Evaluation:   Oxygen  Discharge (Final Oxygen  Re-Evaluation):   Initial Exercise Prescription:  Initial Exercise Prescription - 10/05/23 1500       Date of Initial Exercise RX and Referring Provider   Date 10/05/23    Referring Provider [name], MD    Expected Discharge Date 01/01/24      NuStep   Level 1    SPM 75    Minutes 20    METs 1.5      Prescription Details   Frequency (times per week) 3    Duration Progress to 30 minutes of continuous aerobic without signs/symptoms of physical distress      Intensity   THRR 40-80% of Max Heartrate 59-118    Ratings of Perceived Exertion 11-13    Perceived Dyspnea 0-4      Progression   Progression Continue progressive overload as per policy without signs/symptoms or physical distress.      Resistance Training   Training Prescription Yes    Weight 2 lbs    Reps 10-15          Perform Capillary Blood Glucose checks as needed.  Exercise Prescription Changes:   Exercise Prescription Changes     Row Name 10/16/23 1029             Response to Exercise   Blood Pressure (Admit) 128/70       Blood Pressure (Exercise) 138/68       Blood Pressure (Exit) 106/60       Heart Rate (Admit) 70 bpm       Heart Rate (Exercise) 85 bpm       Heart Rate (Exit) 73 bpm       Oxygen  Saturation (Admit) 98 %       Oxygen  Saturation (Exercise) 95 %       Oxygen  Saturation (Exit) 99 %       Rating of Perceived Exertion (Exercise) 13       Perceived Dyspnea (Exercise) 0       Symptoms None       Comments Off to a good start with exercise.       Duration Continue with 30 min of aerobic exercise without signs/symptoms of physical distress.       Intensity THRR unchanged         Progression   Progression Continue to progress workloads to maintain intensity without  signs/symptoms of physical distress.       Average METs 2.1          Resistance Training   Training Prescription Yes       Weight 2 lbs       Reps 10-15       Time 5 Minutes         Interval Training   Interval Training No         Oxygen    Oxygen  Continuous       Liters 15         NuStep   Level 1       SPM 79       Minutes 30       METs 2.1          Exercise Comments:   Exercise Comments     Row Name 10/16/23 1135           Exercise Comments Gage tolerated low intensity exercise well without symptoms. SaO2 maintained on 15 L O2.          Exercise Goals and Review:   Exercise Goals     Row Name 08/29/23 1625 10/05/23 1534           Exercise Goals   Increase Physical Activity Yes Yes      Intervention Provide advice, education, support and counseling about physical activity/exercise needs.;Develop an individualized exercise prescription for aerobic and resistive training based on initial evaluation findings, risk stratification, comorbidities and participant's personal goals. Provide advice, education, support and counseling about physical activity/exercise needs.;Develop an individualized exercise prescription for aerobic and resistive training based on initial evaluation findings, risk stratification, comorbidities and participant's personal goals.      Expected Outcomes Short Term: Attend rehab on a regular basis to increase amount of physical activity.;Long Term: Add in home exercise to make exercise part of routine and to increase amount of physical activity.;Long Term: Exercising regularly at least 3-5 days a week. Short Term: Attend rehab on a regular basis to increase amount of physical activity.;Long Term: Add in home exercise to make exercise part of routine and to increase amount of physical activity.;Long Term: Exercising regularly at least 3-5 days a week.      Increase Strength and Stamina Yes --      Intervention Provide advice, education, support and counseling about physical activity/exercise needs.;Develop an  individualized exercise prescription for aerobic and resistive training based on initial evaluation findings, risk stratification, comorbidities and participant's personal goals. Provide advice, education, support and counseling about physical activity/exercise needs.;Develop an individualized exercise prescription for aerobic and resistive training based on initial evaluation findings, risk stratification, comorbidities and participant's personal goals.      Expected Outcomes Short Term: Increase workloads from initial exercise prescription for resistance, speed, and METs.;Short Term: Perform resistance training exercises routinely during rehab and add in resistance training at home;Long Term: Improve cardiorespiratory fitness, muscular endurance and strength as measured by increased METs and functional capacity ( ) Short Term: Increase workloads from initial exercise prescription for resistance, speed, and METs.;Short Term: Perform resistance training exercises routinely during rehab and add in resistance training at home;Long Term: Improve cardiorespiratory fitness, muscular endurance and strength as measured by increased METs and functional capacity ( )      Able to understand and use rate of perceived exertion (RPE) scale Yes Yes      Intervention Provide education and explanation on how to use RPE scale Provide education and explanation on how to use RPE  scale      Expected Outcomes Short Term: Able to use RPE daily in rehab to express subjective intensity level;Long Term:  Able to use RPE to guide intensity level when exercising independently Short Term: Able to use RPE daily in rehab to express subjective intensity level;Long Term:  Able to use RPE to guide intensity level when exercising independently      Able to understand and use Dyspnea scale Yes Yes      Intervention Provide education and explanation on how to use Dyspnea scale Provide education and explanation on how to use Dyspnea scale       Expected Outcomes Short Term: Able to use Dyspnea scale daily in rehab to express subjective sense of shortness of breath during exertion;Long Term: Able to use Dyspnea scale to guide intensity level when exercising independently Short Term: Able to use Dyspnea scale daily in rehab to express subjective sense of shortness of breath during exertion;Long Term: Able to use Dyspnea scale to guide intensity level when exercising independently      Knowledge and understanding of Target Heart Rate Range (THRR) Yes Yes      Intervention Provide education and explanation of THRR including how the numbers were predicted and where they are located for reference Provide education and explanation of THRR including how the numbers were predicted and where they are located for reference      Expected Outcomes Short Term: Able to state/look up THRR;Long Term: Able to use THRR to govern intensity when exercising independently;Short Term: Able to use daily as guideline for intensity in rehab Short Term: Able to state/look up THRR;Long Term: Able to use THRR to govern intensity when exercising independently;Short Term: Able to use daily as guideline for intensity in rehab      Understanding of Exercise Prescription Yes Yes      Intervention Provide education, explanation, and written materials on patient's individual exercise prescription Provide education, explanation, and written materials on patient's individual exercise prescription      Expected Outcomes Short Term: Able to explain program exercise prescription;Long Term: Able to explain home exercise prescription to exercise independently Short Term: Able to explain program exercise prescription;Long Term: Able to explain home exercise prescription to exercise independently         Exercise Goals Re-Evaluation :  Exercise Goals Re-Evaluation     Row Name 10/16/23 1135             Exercise Goal Re-Evaluation   Exercise Goals Review Increase Physical  Activity;Increase Strength and Stamina;Able to understand and use Dyspnea scale;Able to understand and use rate of perceived exertion (RPE) scale       Comments Garris was able to understand and use the RPE scale appropriately. He has a pulse oximeter to monitor his oxygen  saturation and pulse.       Expected Outcomes Reshawn will exercise on telemetry monitor for 5 session, then start the pulmonary rehab program.          Discharge Exercise Prescription (Final Exercise Prescription Changes):  Exercise Prescription Changes - 10/16/23 1029       Response to Exercise   Blood Pressure (Admit) 128/70    Blood Pressure (Exercise) 138/68    Blood Pressure (Exit) 106/60    Heart Rate (Admit) 70 bpm    Heart Rate (Exercise) 85 bpm    Heart Rate (Exit) 73 bpm    Oxygen  Saturation (Admit) 98 %    Oxygen  Saturation (Exercise) 95 %    Oxygen  Saturation (  Exit) 99 %    Rating of Perceived Exertion (Exercise) 13    Perceived Dyspnea (Exercise) 0    Symptoms None    Comments Off to a good start with exercise.    Duration Continue with 30 min of aerobic exercise without signs/symptoms of physical distress.    Intensity THRR unchanged      Progression   Progression Continue to progress workloads to maintain intensity without signs/symptoms of physical distress.    Average METs 2.1      Resistance Training   Training Prescription Yes    Weight 2 lbs    Reps 10-15    Time 5 Minutes      Interval Training   Interval Training No      Oxygen    Oxygen  Continuous    Liters 15      NuStep   Level 1    SPM 79    Minutes 30    METs 2.1          Nutrition:  Target Goals: Understanding of nutrition guidelines, daily intake of sodium 1500mg , cholesterol 200mg , calories 30% from fat and 7% or less from saturated fats, daily to have 5 or more servings of fruits and vegetables.  Biometrics:  Pre Biometrics - 10/05/23 0930       Pre Biometrics   Height 5' 9.25 (1.759 m)    Weight 96.1 kg     Waist Circumference 43 inches    Hip Circumference 43.5 inches    Waist to Hip Ratio 0.99 %    BMI (Calculated) 31.06    Triceps Skinfold 18 mm    % Body Fat 29.7 %    Grip Strength 29 kg    Flexibility 0 in   Could not reach   Single Leg Stand 1.5 seconds           Nutrition Therapy Plan and Nutrition Goals:   Nutrition Assessments:  MEDIFICTS Score Key: >=70 Need to make dietary changes  40-70 Heart Healthy Diet <= 40 Therapeutic Level Cholesterol Diet   Flowsheet Row INTENSIVE CARDIAC REHAB ORIENT from 10/05/2023 in [facility]  Picture Your Plate Total Score on Admission 64   Picture Your Plate Scores: <59 Unhealthy dietary pattern with much room for improvement. 41-50 Dietary pattern unlikely to meet recommendations for good health and room for improvement. 51-60 More healthful dietary pattern, with some room for improvement.  >60 Healthy dietary pattern, although there may be some specific behaviors that could be improved.    Nutrition Goals Re-Evaluation:   Nutrition Goals Re-Evaluation:   Nutrition Goals Discharge (Final Nutrition Goals Re-Evaluation):   Psychosocial: Target Goals: Acknowledge presence or absence of significant depression and/or stress, maximize coping skills, provide positive support system. Participant is able to verbalize types and ability to use techniques and skills needed for reducing stress and depression.  Initial Review & Psychosocial Screening:  Initial Psych Review & Screening - 10/05/23 0900       Initial Review   Current issues with None Identified      Family Dynamics   Good Support System? Yes   Glover has his wife, 3 children and 3 grandchildren for support     Barriers   Psychosocial barriers to participate in program There are no identifiable barriers or psychosocial needs.      Screening Interventions   Interventions Encouraged to exercise           Quality of Life  Scores:  Quality of Life - 10/05/23 1536       Quality of Life Scores   Health/Function Pre 22.23 %    Socioeconomic Pre 23.58 %    Psych/Spiritual Pre 27.14 %    Family Pre 27 %    GLOBAL Pre 24.24 %         Scores of 19 and below usually indicate a poorer quality of life in these areas.  A difference of  2-3 points is a clinically meaningful difference.  A difference of 2-3 points in the total score of the Quality of Life Index has been associated with significant improvement in overall quality of life, self-image, physical symptoms, and general health in studies assessing change in quality of life.  PHQ-9: Review Flowsheet  More data exists      10/05/2023 08/29/2023 08/02/2023 07/06/2023 07/27/2022  Depression screen PHQ 2/9  Decreased Interest 2 2 0 0 0  Down, Depressed, Hopeless 0 0 0 0 0  PHQ - 2 Score 2 2 0 0 0  Altered sleeping 0 0 0 - -  Tired, decreased energy 2 2 0 - -  Change in appetite 0 0 0 - -  Feeling bad or failure about yourself  0 0 0 - -  Trouble concentrating 0 0 0 - -  Moving slowly or fidgety/restless 0 0 0 - -  Suicidal thoughts 0 0 0 - -  PHQ-9 Score 4 4 0 - -  Difficult doing work/chores Not difficult at all Not difficult at all Not difficult at all - -   Interpretation of Total Score  Total Score Depression Severity:  1-4 = Minimal depression, 5-9 = Mild depression, 10-14 = Moderate depression, 15-19 = Moderately severe depression, 20-27 = Severe depression   Psychosocial Evaluation and Intervention:   Psychosocial Re-Evaluation:   Psychosocial Discharge (Final Psychosocial Re-Evaluation):   Vocational Rehabilitation: Provide vocational rehab assistance to qualifying candidates.   Vocational Rehab Evaluation & Intervention:  Vocational Rehab - 10/05/23 0901       Initial Vocational Rehab Evaluation & Intervention   Assessment shows need for Vocational Rehabilitation No   Kaenan is retired and does not need vocational  rehab at this time         Education: Education Goals: Education classes will be provided on a weekly basis, covering required topics. Participant will state understanding/return demonstration of topics presented.     Core Videos: Exercise    Move It!  Clinical staff conducted group or individual video education with verbal and written material and guidebook.  Patient learns the recommended Pritikin exercise program. Exercise with the goal of living a long, healthy life. Some of the health benefits of exercise include controlled diabetes, healthier blood pressure levels, improved cholesterol levels, improved heart and lung capacity, improved sleep, and better body composition. Everyone should speak with their doctor before starting or changing an exercise routine.  Biomechanical Limitations Clinical staff conducted group or individual video education with verbal and written material and guidebook.  Patient learns how biomechanical limitations can impact exercise and how we can mitigate and possibly overcome limitations to have an impactful and balanced exercise routine.  Body Composition Clinical staff conducted group or individual video education with verbal and written material and guidebook.  Patient learns that body composition (ratio of muscle mass to fat mass) is a key component to assessing overall fitness, rather than body weight alone. Increased fat mass, especially visceral belly fat, can put us  at increased risk for metabolic  syndrome, type 2 diabetes, heart disease, and even death. It is recommended to combine diet and exercise (cardiovascular and resistance training) to improve your body composition. Seek guidance from your physician and exercise physiologist before implementing an exercise routine.  Exercise Action Plan Clinical staff conducted group or individual video education with verbal and written material and guidebook.  Patient learns the recommended strategies to  achieve and enjoy long-term exercise adherence, including variety, self-motivation, self-efficacy, and positive decision making. Benefits of exercise include fitness, good health, weight management, more energy, better sleep, less stress, and overall well-being.  Medical   Heart Disease Risk Reduction Clinical staff conducted group or individual video education with verbal and written material and guidebook.  Patient learns our heart is our most vital organ as it circulates oxygen , nutrients, white blood cells, and hormones throughout the entire body, and carries waste away. Data supports a plant-based eating plan like the Pritikin Program for its effectiveness in slowing progression of and reversing heart disease. The video provides a number of recommendations to address heart disease.   Metabolic Syndrome and Belly Fat  Clinical staff conducted group or individual video education with verbal and written material and guidebook.  Patient learns what metabolic syndrome is, how it leads to heart disease, and how one can reverse it and keep it from coming back. You have metabolic syndrome if you have 3 of the following 5 criteria: abdominal obesity, high blood pressure, high triglycerides, low HDL cholesterol, and high blood sugar.  Hypertension and Heart Disease Clinical staff conducted group or individual video education with verbal and written material and guidebook.  Patient learns that high blood pressure, or hypertension, is very common in the [place] . Hypertension is largely due to excessive salt intake, but other important risk factors include being overweight, physical inactivity, drinking too much alcohol, smoking, and not eating enough potassium from fruits and vegetables. High blood pressure is a leading risk factor for heart attack, stroke, congestive heart failure, dementia, kidney failure, and premature death. Long-term effects of excessive salt intake include stiffening of the  arteries and thickening of heart muscle and organ damage. Recommendations include ways to reduce hypertension and the risk of heart disease.  Diseases of Our Time - Focusing on Diabetes Clinical staff conducted group or individual video education with verbal and written material and guidebook.  Patient learns why the best way to stop diseases of our time is prevention, through food and other lifestyle changes. Medicine (such as prescription pills and surgeries) is often only a Band-Aid on the problem, not a long-term solution. Most common diseases of our time include obesity, type 2 diabetes, hypertension, heart disease, and cancer. The Pritikin Program is recommended and has been proven to help reduce, reverse, and/or prevent the damaging effects of metabolic syndrome.  Nutrition   Overview of the Pritikin Eating Plan  Clinical staff conducted group or individual video education with verbal and written material and guidebook.  Patient learns about the Pritikin Eating Plan for disease risk reduction. The Pritikin Eating Plan emphasizes a wide variety of unrefined, minimally-processed carbohydrates, like fruits, vegetables, whole grains, and legumes. Go, Caution, and Stop food choices are explained. Plant-based and lean animal proteins are emphasized. Rationale provided for low sodium intake for blood pressure control, low added sugars for blood sugar stabilization, and low added fats and oils for coronary artery disease risk reduction and weight management.  Calorie Density  Clinical staff conducted group or individual video education with verbal and written material and  guidebook.  Patient learns about calorie density and how it impacts the Pritikin Eating Plan. Knowing the characteristics of the food you choose will help you decide whether those foods will lead to weight gain or weight loss, and whether you want to consume more or less of them. Weight loss is usually a side effect of the Pritikin  Eating Plan because of its focus on low calorie-dense foods.  Label Reading  Clinical staff conducted group or individual video education with verbal and written material and guidebook.  Patient learns about the Pritikin recommended label reading guidelines and corresponding recommendations regarding calorie density, added sugars, sodium content, and whole grains.  Dining Out - Part 1  Clinical staff conducted group or individual video education with verbal and written material and guidebook.  Patient learns that restaurant meals can be sabotaging because they can be so high in calories, fat, sodium, and/or sugar. Patient learns recommended strategies on how to positively address this and avoid unhealthy pitfalls.  Facts on Fats  Clinical staff conducted group or individual video education with verbal and written material and guidebook.  Patient learns that lifestyle modifications can be just as effective, if not more so, as many medications for lowering your risk of heart disease. A Pritikin lifestyle can help to reduce your risk of inflammation and atherosclerosis (cholesterol build-up, or plaque, in the artery walls). Lifestyle interventions such as dietary choices and physical activity address the cause of atherosclerosis. A review of the types of fats and their impact on blood cholesterol levels, along with dietary recommendations to reduce fat intake is also included.  Nutrition Action Plan  Clinical staff conducted group or individual video education with verbal and written material and guidebook.  Patient learns how to incorporate Pritikin recommendations into their lifestyle. Recommendations include planning and keeping personal health goals in mind as an important part of their success.  Healthy Mind-Set    Healthy Minds, Bodies, Hearts  Clinical staff conducted group or individual video education with verbal and written material and guidebook.  Patient learns how to identify when they  are stressed. Video will discuss the impact of that stress, as well as the many benefits of stress management. Patient will also be introduced to stress management techniques. The way we think, act, and feel has an impact on our hearts.  How Our Thoughts Can Heal Our Hearts  Clinical staff conducted group or individual video education with verbal and written material and guidebook.  Patient learns that negative thoughts can cause depression and anxiety. This can result in negative lifestyle behavior and serious health problems. Cognitive behavioral therapy is an effective method to help control our thoughts in order to change and improve our emotional outlook.  Additional Videos:  Exercise    Improving Performance  Clinical staff conducted group or individual video education with verbal and written material and guidebook.  Patient learns to use a non-linear approach by alternating intensity levels and lengths of time spent exercising to help burn more calories and lose more body fat. Cardiovascular exercise helps improve heart health, metabolism, hormonal balance, blood sugar control, and recovery from fatigue. Resistance training improves strength, endurance, balance, coordination, reaction time, metabolism, and muscle mass. Flexibility exercise improves circulation, posture, and balance. Seek guidance from your physician and exercise physiologist before implementing an exercise routine and learn your capabilities and proper form for all exercise.  Introduction to Yoga  Clinical staff conducted group or individual video education with verbal and written material and guidebook.  Patient  learns about yoga, a discipline of the coming together of mind, breath, and body. The benefits of yoga include improved flexibility, improved range of motion, better posture and core strength, increased lung function, weight loss, and positive self-image. Yoga's heart health benefits include lowered blood pressure,  healthier heart rate, decreased cholesterol and triglyceride levels, improved immune function, and reduced stress. Seek guidance from your physician and exercise physiologist before implementing an exercise routine and learn your capabilities and proper form for all exercise.  Medical   Aging: Enhancing Your Quality of Life  Clinical staff conducted group or individual video education with verbal and written material and guidebook.  Patient learns key strategies and recommendations to stay in good physical health and enhance quality of life, such as prevention strategies, having an advocate, securing a Health Care Proxy and Power of Attorney, and keeping a list of medications and system for tracking them. It also discusses how to avoid risk for bone loss.  Biology of Weight Control  Clinical staff conducted group or individual video education with verbal and written material and guidebook.  Patient learns that weight gain occurs because we consume more calories than we burn (eating more, moving less). Even if your body weight is normal, you may have higher ratios of fat compared to muscle mass. Too much body fat puts you at increased risk for cardiovascular disease, heart attack, stroke, type 2 diabetes, and obesity-related cancers. In addition to exercise, following the Pritikin Eating Plan can help reduce your risk.  Decoding Lab Results  Clinical staff conducted group or individual video education with verbal and written material and guidebook.  Patient learns that lab test reflects one measurement whose values change over time and are influenced by many factors, including medication, stress, sleep, exercise, food, hydration, pre-existing medical conditions, and more. It is recommended to use the knowledge from this video to become more involved with your lab results and evaluate your numbers to speak with your doctor.   Diseases of Our Time - Overview  Clinical staff conducted group or  individual video education with verbal and written material and guidebook.  Patient learns that according to the CDC, 50% to 70% of chronic diseases (such as obesity, type 2 diabetes, elevated lipids, hypertension, and heart disease) are avoidable through lifestyle improvements including healthier food choices, listening to satiety cues, and increased physical activity.  Sleep Disorders Clinical staff conducted group or individual video education with verbal and written material and guidebook.  Patient learns how good quality and duration of sleep are important to overall health and well-being. Patient also learns about sleep disorders and how they impact health along with recommendations to address them, including discussing with a physician.  Nutrition  Dining Out - Part 2 Clinical staff conducted group or individual video education with verbal and written material and guidebook.  Patient learns how to plan ahead and communicate in order to maximize their dining experience in a healthy and nutritious manner. Included are recommended food choices based on the type of restaurant the patient is visiting.   Fueling a Banker conducted group or individual video education with verbal and written material and guidebook.  There is a strong connection between our food choices and our health. Diseases like obesity and type 2 diabetes are very prevalent and are in large-part due to lifestyle choices. The Pritikin Eating Plan provides plenty of food and hunger-curbing satisfaction. It is easy to follow, affordable, and helps reduce health risks.  Menu Workshop  Clinical staff conducted group or individual video education with verbal and written material and guidebook.  Patient learns that restaurant meals can sabotage health goals because they are often packed with calories, fat, sodium, and sugar. Recommendations include strategies to plan ahead and to communicate with the manager,  chef, or server to help order a healthier meal.  Planning Your Eating Strategy  Clinical staff conducted group or individual video education with verbal and written material and guidebook.  Patient learns about the Pritikin Eating Plan and its benefit of reducing the risk of disease. The Pritikin Eating Plan does not focus on calories. Instead, it emphasizes high-quality, nutrient-rich foods. By knowing the characteristics of the foods, we choose, we can determine their calorie density and make informed decisions.  Targeting Your Nutrition Priorities  Clinical staff conducted group or individual video education with verbal and written material and guidebook.  Patient learns that lifestyle habits have a tremendous impact on disease risk and progression. This video provides eating and physical activity recommendations based on your personal health goals, such as reducing LDL cholesterol, losing weight, preventing or controlling type 2 diabetes, and reducing high blood pressure.  Vitamins and Minerals  Clinical staff conducted group or individual video education with verbal and written material and guidebook.  Patient learns different ways to obtain key vitamins and minerals, including through a recommended healthy diet. It is important to discuss all supplements you take with your doctor.   Healthy Mind-Set    Smoking Cessation  Clinical staff conducted group or individual video education with verbal and written material and guidebook.  Patient learns that cigarette smoking and tobacco addiction pose a serious health risk which affects millions of people. Stopping smoking will significantly reduce the risk of heart disease, lung disease, and many forms of cancer. Recommended strategies for quitting are covered, including working with your doctor to develop a successful plan.  Culinary   Becoming a Set designer conducted group or individual video education with verbal and written  material and guidebook.  Patient learns that cooking at home can be healthy, cost-effective, quick, and puts them in control. Keys to cooking healthy recipes will include looking at your recipe, assessing your equipment needs, planning ahead, making it simple, choosing cost-effective seasonal ingredients, and limiting the use of added fats, salts, and sugars.  Cooking - Breakfast and Snacks  Clinical staff conducted group or individual video education with verbal and written material and guidebook.  Patient learns how important breakfast is to satiety and nutrition through the entire day. Recommendations include key foods to eat during breakfast to help stabilize blood sugar levels and to prevent overeating at meals later in the day. Planning ahead is also a key component.  Cooking - Educational psychologist conducted group or individual video education with verbal and written material and guidebook.  Patient learns eating strategies to improve overall health, including an approach to cook more at home. Recommendations include thinking of animal protein as a side on your plate rather than center stage and focusing instead on lower calorie dense options like vegetables, fruits, whole grains, and plant-based proteins, such as beans. Making sauces in large quantities to freeze for later and leaving the skin on your vegetables are also recommended to maximize your experience.  Cooking - Healthy Salads and Dressing Clinical staff conducted group or individual video education with verbal and written material and guidebook.  Patient learns that vegetables, fruits, whole grains, and legumes are the foundations  of the Pritikin Eating Plan. Recommendations include how to incorporate each of these in flavorful and healthy salads, and how to create homemade salad dressings. Proper handling of ingredients is also covered. Cooking - Soups and [employer] - Soups and Desserts Clinical staff conducted  group or individual video education with verbal and written material and guidebook.  Patient learns that Pritikin soups and desserts make for easy, nutritious, and delicious snacks and meal components that are low in sodium, fat, sugar, and calorie density, while high in vitamins, minerals, and filling fiber. Recommendations include simple and healthy ideas for soups and desserts.   Overview     The Pritikin Solution Program Overview Clinical staff conducted group or individual video education with verbal and written material and guidebook.  Patient learns that the results of the Pritikin Program have been documented in more than 100 articles published in peer-reviewed journals, and the benefits include reducing risk factors for (and, in some cases, even reversing) high cholesterol, high blood pressure, type 2 diabetes, obesity, and more! An overview of the three key pillars of the Pritikin Program will be covered: eating well, doing regular exercise, and having a healthy mind-set.  WORKSHOPS  Exercise: Exercise Basics: Building Your Action Plan Clinical staff led group instruction and group discussion with PowerPoint presentation and patient guidebook. To enhance the learning environment the use of posters, models and videos may be added. At the conclusion of this workshop, patients will comprehend the difference between physical activity and exercise, as well as the benefits of incorporating both, into their routine. Patients will understand the FITT (Frequency, Intensity, Time, and Type) principle and how to use it to build an exercise action plan. In addition, safety concerns and other considerations for exercise and cardiac rehab will be addressed by the presenter. The purpose of this lesson is to promote a comprehensive and effective weekly exercise routine in order to improve patients' overall level of fitness.   Managing Heart Disease: Your Path to a Healthier Heart Clinical staff led  group instruction and group discussion with PowerPoint presentation and patient guidebook. To enhance the learning environment the use of posters, models and videos may be added.At the conclusion of this workshop, patients will understand the anatomy and physiology of the heart. Additionally, they will understand how Pritikin's three pillars impact the risk factors, the progression, and the management of heart disease.  The purpose of this lesson is to provide a high-level overview of the heart, heart disease, and how the Pritikin lifestyle positively impacts risk factors.  Exercise Biomechanics Clinical staff led group instruction and group discussion with PowerPoint presentation and patient guidebook. To enhance the learning environment the use of posters, models and videos may be added. Patients will learn how the structural parts of their bodies function and how these functions impact their daily activities, movement, and exercise. Patients will learn how to promote a neutral spine, learn how to manage pain, and identify ways to improve their physical movement in order to promote healthy living. The purpose of this lesson is to expose patients to common physical limitations that impact physical activity. Participants will learn practical ways to adapt and manage aches and pains, and to minimize their effect on regular exercise. Patients will learn how to maintain good posture while sitting, walking, and lifting.  Balance Training and Fall Prevention  Clinical staff led group instruction and group discussion with PowerPoint presentation and patient guidebook. To enhance the learning environment the use of posters, models and videos  may be added. At the conclusion of this workshop, patients will understand the importance of their sensorimotor skills (vision, proprioception, and the vestibular system) in maintaining their ability to balance as they age. Patients will apply a variety of balancing  exercises that are appropriate for their current level of function. Patients will understand the common causes for poor balance, possible solutions to these problems, and ways to modify their physical environment in order to minimize their fall risk. The purpose of this lesson is to teach patients about the importance of maintaining balance as they age and ways to minimize their risk of falling.  WORKSHOPS   Nutrition:  Fueling a Ship broker led group instruction and group discussion with PowerPoint presentation and patient guidebook. To enhance the learning environment the use of posters, models and videos may be added. Patients will review the foundational principles of the Pritikin Eating Plan and understand what constitutes a serving size in each of the food groups. Patients will also learn Pritikin-friendly foods that are better choices when away from home and review make-ahead meal and snack options. Calorie density will be reviewed and applied to three nutrition priorities: weight maintenance, weight loss, and weight gain. The purpose of this lesson is to reinforce (in a group setting) the key concepts around what patients are recommended to eat and how to apply these guidelines when away from home by planning and selecting Pritikin-friendly options. Patients will understand how calorie density may be adjusted for different weight management goals.  Mindful Eating  Clinical staff led group instruction and group discussion with PowerPoint presentation and patient guidebook. To enhance the learning environment the use of posters, models and videos may be added. Patients will briefly review the concepts of the Pritikin Eating Plan and the importance of low-calorie dense foods. The concept of mindful eating will be introduced as well as the importance of paying attention to internal hunger signals. Triggers for non-hunger eating and techniques for dealing with triggers will be explored.  The purpose of this lesson is to provide patients with the opportunity to review the basic principles of the Pritikin Eating Plan, discuss the value of eating mindfully and how to measure internal cues of hunger and fullness using the Hunger Scale. Patients will also discuss reasons for non-hunger eating and learn strategies to use for controlling emotional eating.  Targeting Your Nutrition Priorities Clinical staff led group instruction and group discussion with PowerPoint presentation and patient guidebook. To enhance the learning environment the use of posters, models and videos may be added. Patients will learn how to determine their genetic susceptibility to disease by reviewing their family history. Patients will gain insight into the importance of diet as part of an overall healthy lifestyle in mitigating the impact of genetics and other environmental insults. The purpose of this lesson is to provide patients with the opportunity to assess their personal nutrition priorities by looking at their family history, their own health history and current risk factors. Patients will also be able to discuss ways of prioritizing and modifying the Pritikin Eating Plan for their highest risk areas  Menu  Clinical staff led group instruction and group discussion with PowerPoint presentation and patient guidebook. To enhance the learning environment the use of posters, models and videos may be added. Using menus brought in from [employer], or printed from [employer], patients will apply the Pritikin dining out guidelines that were presented in the [employer] video. Patients will also be able to  practice these guidelines in a variety of provided scenarios. The purpose of this lesson is to provide patients with the opportunity to practice hands-on learning of the Pritikin Dining Out guidelines with actual menus and practice scenarios.  Label Reading Clinical staff led group instruction  and group discussion with PowerPoint presentation and patient guidebook. To enhance the learning environment the use of posters, models and videos may be added. Patients will review and discuss the P[employer]ikin label reading guidelines presented in [employer]'s Label Reading Educational series video. Using fool labels brought in from local grocery stores and markets, patients will apply the label reading guidelines and determine if the packaged food meet the Pritikin guidelines. The purpose of this lesson is to provide patients with the opportunity to review, discuss, and practice hands-on learning of the [employer]ikin Label Reading guidelines with actual packaged food labels. Cooking School  [employer]'s [employer] are designed to teach patients ways to prepare quick, simple, and affordable recipes at home. The importance of nutrition's role in chronic disease risk reduction is reflected in its emphasis in the overall P[employer]ikin program. By learning how to prepare essential core P[employer]ikin Eating Plan recipes, patients will increase control over what they eat; be able to customize the flavor of foods without the use of added salt, sugar, or fat; and improve the quality of the food they consume. By learning a set of core recipes which are easily assembled, quickly prepared, and affordable, patients are more likely to prepare more healthy foods at home. These workshops focus on convenient breakfasts, simple entres, side dishes, and desserts which can be prepared with minimal effort and are consistent with nutrition recommendations for cardiovascular risk reduction. Cooking [employer] are taught by a Armed forces logistics/support/administrative officer (RD) who has been trained by the [employer]. The chef or RD has a clear understanding of the importance of minimizing - if not completely eliminating - added fat, sugar, and sodium in recipes. Throughout the series of Cooking School Workshop sessions, patients will learn  about healthy ingredients and efficient methods of cooking to build confidence in their capability to prepare    Cooking School weekly topics:  Adding Flavor- Sodium-Free  Fast and Healthy Breakfasts  Powerhouse Plant-Based Proteins  Satisfying Salads and Dressings  Simple Sides and Sauces  International Cuisine-Spotlight on the [employer] Zones  Delicious Desserts  Savory Soups  [employer] - Meals in a Astronomer Appetizers and Snacks  Comforting Weekend Breakfasts  One-Pot Wonders   Fast Evening Meals  Landscape architect Your Pritikin Plate  WORKSHOPS   Healthy Mindset (Psychosocial):  Focused Goals, Sustainable Changes Clinical staff led group instruction and group discussion with PowerPoint presentation and patient guidebook. To enhance the learning environment the use of posters, models and videos may be added. Patients will be able to apply effective goal setting strategies to establish at least one personal goal, and then take consistent, meaningful action toward that goal. They will learn to identify common barriers to achieving personal goals and develop strategies to overcome them. Patients will also gain an understanding of how our mind-set can impact our ability to achieve goals and the importance of cultivating a positive and growth-oriented mind-set. The purpose of this lesson is to provide patients with a deeper understanding of how to set and achieve personal goals, as well as the tools and strategies needed to overcome common obstacles which may arise along the way.  From Head to Heart: The Power of a Healthy  Outlook  Clinical staff led group instruction and group discussion with PowerPoint presentation and patient guidebook. To enhance the learning environment the use of posters, models and videos may be added. Patients will be able to recognize and describe the impact of emotions and mood on physical health. They will discover the importance of  self-care and explore self-care practices which may work for them. Patients will also learn how to utilize the 4 C's to cultivate a healthier outlook and better manage stress and challenges. The purpose of this lesson is to demonstrate to patients how a healthy outlook is an essential part of maintaining good health, especially as they continue their cardiac rehab journey.  Healthy Sleep for a Healthy Heart Clinical staff led group instruction and group discussion with PowerPoint presentation and patient guidebook. To enhance the learning environment the use of posters, models and videos may be added. At the conclusion of this workshop, patients will be able to demonstrate knowledge of the importance of sleep to overall health, well-being, and quality of life. They will understand the symptoms of, and treatments for, common sleep disorders. Patients will also be able to identify daytime and nighttime behaviors which impact sleep, and they will be able to apply these tools to help manage sleep-related challenges. The purpose of this lesson is to provide patients with a general overview of sleep and outline the importance of quality sleep. Patients will learn about a few of the most common sleep disorders. Patients will also be introduced to the concept of "sleep hygiene," and discover ways to self-manage certain sleeping problems through simple daily behavior changes. Finally, the workshop will motivate patients by clarifying the links between quality sleep and their goals of heart-healthy living.   Recognizing and Reducing Stress Clinical staff led group instruction and group discussion with PowerPoint presentation and patient guidebook. To enhance the learning environment the use of posters, models and videos may be added. At the conclusion of this workshop, patients will be able to understand the types of stress reactions, differentiate between acute and chronic stress, and recognize the impact that chronic  stress has on their health. They will also be able to apply different coping mechanisms, such as reframing negative self-talk. Patients will have the opportunity to practice a variety of stress management techniques, such as deep abdominal breathing, progressive muscle relaxation, and/or guided imagery.  The purpose of this lesson is to educate patients on the role of stress in their lives and to provide healthy techniques for coping with it.  Learning Barriers/Preferences:  Learning Barriers/Preferences - 10/05/23 1540       Learning Barriers/Preferences   Learning Barriers Hearing;Sight    Learning Preferences Skilled Demonstration;Group Instruction;Individual Instruction          Education Topics:  Knowledge Questionnaire Score:  Knowledge Questionnaire Score - 10/05/23 0857       Knowledge Questionnaire Score   Pre Score 21/24          Core Components/Risk Factors/Patient Goals at Admission:  Personal Goals and Risk Factors at Admission - 10/05/23 1505       Core Components/Risk Factors/Patient Goals on Admission   Improve shortness of breath with ADL's Yes    Intervention Provide education, individualized exercise plan and daily activity instruction to help decrease symptoms of SOB with activities of daily living.    Expected Outcomes Short Term: Improve cardiorespiratory fitness to achieve a reduction of symptoms when performing ADLs;Long Term: Be able to perform more ADLs without symptoms or delay the  onset of symptoms    Intervention Provide education about signs/symptoms and action to take for hypo/hyperglycemia.;Provide education about proper nutrition, including hydration, and aerobic/resistive exercise prescription along with prescribed medications to achieve blood glucose in normal ranges: Fasting glucose 65-99 mg/dL    Expected Outcomes Short Term: Participant verbalizes understanding of the signs/symptoms and immediate care of hyper/hypoglycemia, proper foot care  and importance of medication, aerobic/resistive exercise and nutrition plan for blood glucose control.;Long Term: Attainment of HbA1C < 7%.    Intervention Provide education on lifestyle modifcations including regular physical activity/exercise, weight management, moderate sodium restriction and increased consumption of fresh fruit, vegetables, and low fat dairy, alcohol moderation, and smoking cessation.;Monitor prescription use compliance.    Expected Outcomes Short Term: Continued assessment and intervention until BP is < 140/47mm HG in hypertensive participants. < 130/20mm HG in hypertensive participants with diabetes, heart failure or chronic kidney disease.;Long Term: Maintenance of blood pressure at goal levels.    Intervention Provide education and support for participant on nutrition & aerobic/resistive exercise along with prescribed medications to achieve LDL 70mg , HDL >40mg .    Expected Outcomes Short Term: Participant states understanding of desired cholesterol values and is compliant with medications prescribed. Participant is following exercise prescription and nutrition guidelines.;Long Term: Cholesterol controlled with medications as prescribed, with individualized exercise RX and with personalized nutrition plan. Value goals: LDL < 70mg , HDL > 40 mg.          Core Components/Risk Factors/Patient Goals Review:    Core Components/Risk Factors/Patient Goals at Discharge (Final Review):    ITP Comments:  ITP Comments     Row Name 08/29/23 1534 10/05/23 0756 10/16/23 1638       ITP Comments Wilbert Bihari, MD: Medical Director. Introduction to the [facility] / Intensive Cardiac Rehab. Initial oriention packet reviewed with the patient. Wilbert Bihari, MD: Medical Director. Introduction to the [facility] / Intensive Cardiac Rehab. Initial oriention packet reviewed with the patient. Patient brought back for 6 minute walk test today 30-Day ITP review. Chijioke  started the cardiac rehab program today and will attend 5 sessions and transfer to the pulmonary rehab program. Oxygen  saturation was maintained in the mid to upper 90s on 15L O2.        Comments: Jerret Mcbane started the cardiac rehab program today and tolerated low intensity exercise without symptoms. He will attend 5 sessions of cardiac rehab on telemetry monitoring before transferring to the pulmonary rehab program. He participated in group warm-up and cool-down with hand weights and exercised 30 minutes on the recumbent stepper at level 1. He was normal sinus rhythm with LBBB and 1st degree AV block. No change from previous EKG.  Arnoldo CHRISTELLA Gal, MS, ACSM CEP 10/16/2023 1650

## 2023-10-17 DIAGNOSIS — D849 Immunodeficiency, unspecified: Secondary | ICD-10-CM | POA: Diagnosis not present

## 2023-10-18 ENCOUNTER — Encounter (HOSPITAL_COMMUNITY)
Admission: RE | Admit: 2023-10-18 | Discharge: 2023-10-18 | Disposition: A | Discharge status: Home / Self Care | Source: Ambulatory Visit | Attending: Cardiovascular Disease | Admitting: Cardiovascular Disease

## 2023-10-18 ENCOUNTER — Ambulatory Visit (HOSPITAL_COMMUNITY)

## 2023-10-18 DIAGNOSIS — Z952 Presence of prosthetic heart valve: Secondary | ICD-10-CM

## 2023-10-18 LAB — GLUCOSE, CAPILLARY
Glucose-Capillary: 151 mg/dL — ABNORMAL HIGH (ref 70–99)
Glucose-Capillary: 91 mg/dL (ref 70–99)

## 2023-10-20 ENCOUNTER — Encounter (HOSPITAL_COMMUNITY)

## 2023-10-20 ENCOUNTER — Ambulatory Visit (HOSPITAL_COMMUNITY)

## 2023-10-23 ENCOUNTER — Ambulatory Visit (HOSPITAL_COMMUNITY)

## 2023-10-23 ENCOUNTER — Encounter (HOSPITAL_COMMUNITY)
Admission: RE | Admit: 2023-10-23 | Discharge: 2023-10-23 | Disposition: A | Discharge status: Home / Self Care | Source: Ambulatory Visit | Attending: Cardiovascular Disease

## 2023-10-23 DIAGNOSIS — Z952 Presence of prosthetic heart valve: Secondary | ICD-10-CM

## 2023-10-25 ENCOUNTER — Encounter (HOSPITAL_COMMUNITY)
Admission: RE | Admit: 2023-10-25 | Discharge: 2023-10-25 | Disposition: A | Discharge status: Home / Self Care | Source: Ambulatory Visit | Attending: Cardiovascular Disease

## 2023-10-25 ENCOUNTER — Ambulatory Visit (HOSPITAL_COMMUNITY)

## 2023-10-25 DIAGNOSIS — Z952 Presence of prosthetic heart valve: Secondary | ICD-10-CM

## 2023-10-25 NOTE — Progress Notes (Signed)
 Reviewed home exercise guidelines with William [name] including endpoints, temperature precautions, target heart rate, rate of perceived dyspnea and rate of perceived exertion. William [name] will walk 10 minutes 1-3 times/day 2-3 days/week indoors as tolerated as his mode of home exercise. William [name] monitors his oxygen  saturation with his pulse oximeter and knows to maintain greater than 90%. Reinforced pursed-lip breath. He has a treadmill and stationary bike at home but not currently using those modalities.  He will enroll in the pulmonary rehab in the near future to continue his exercise. William [name] voices understanding of instructions given.   William CHRISTELLA Gal, MS, ACSM CEP

## 2023-10-26 DIAGNOSIS — Z944 Liver transplant status: Secondary | ICD-10-CM | POA: Diagnosis not present

## 2023-10-26 DIAGNOSIS — D849 Immunodeficiency, unspecified: Secondary | ICD-10-CM | POA: Diagnosis not present

## 2023-10-27 ENCOUNTER — Encounter (HOSPITAL_COMMUNITY)

## 2023-10-27 ENCOUNTER — Ambulatory Visit (HOSPITAL_COMMUNITY)

## 2023-10-30 ENCOUNTER — Ambulatory Visit (HOSPITAL_COMMUNITY)

## 2023-10-30 ENCOUNTER — Encounter (HOSPITAL_COMMUNITY)
Admission: RE | Admit: 2023-10-30 | Discharge: 2023-10-30 | Disposition: A | Discharge status: Home / Self Care | Source: Ambulatory Visit | Attending: Cardiovascular Disease | Admitting: Cardiovascular Disease

## 2023-10-30 VITALS — BP 106/72 | HR 60 | Wt 211.4 lb

## 2023-10-30 DIAGNOSIS — Z952 Presence of prosthetic heart valve: Secondary | ICD-10-CM

## 2023-11-01 ENCOUNTER — Ambulatory Visit (HOSPITAL_COMMUNITY)

## 2023-11-01 ENCOUNTER — Encounter (HOSPITAL_COMMUNITY)

## 2023-11-01 DIAGNOSIS — Z79899 Other long term (current) drug therapy: Secondary | ICD-10-CM | POA: Diagnosis not present

## 2023-11-01 DIAGNOSIS — J849 Interstitial pulmonary disease, unspecified: Secondary | ICD-10-CM | POA: Diagnosis not present

## 2023-11-01 DIAGNOSIS — R0902 Hypoxemia: Secondary | ICD-10-CM | POA: Diagnosis not present

## 2023-11-01 DIAGNOSIS — K7581 Nonalcoholic steatohepatitis (NASH): Secondary | ICD-10-CM | POA: Diagnosis not present

## 2023-11-01 DIAGNOSIS — Z944 Liver transplant status: Secondary | ICD-10-CM | POA: Diagnosis not present

## 2023-11-01 DIAGNOSIS — K7681 Hepatopulmonary syndrome: Secondary | ICD-10-CM | POA: Diagnosis not present

## 2023-11-01 DIAGNOSIS — Z952 Presence of prosthetic heart valve: Secondary | ICD-10-CM | POA: Diagnosis not present

## 2023-11-01 DIAGNOSIS — R0602 Shortness of breath: Secondary | ICD-10-CM | POA: Diagnosis not present

## 2023-11-01 NOTE — Progress Notes (Signed)
 Discharge Progress Report  Patient Details  Name: William [name] MRN: [id number] Date of Birth: 1950/10/03 Referring Provider:   Flowsheet Row INTENSIVE CARDIAC REHAB ORIENT from 10/05/2023 in [facility]  Referring Provider [name], MD     Number of Visits: 9  Reason for Discharge:  Early Exit:  Personal  Smoking History:  Social History   Tobacco Use  Smoking Status Former   Current packs/day: 0.00   Average packs/day: 0.5 packs/day for 17.0 years (8.5 ttl pk-yrs)   Types: Cigarettes   Start date: 1966   Quit date: 102   Years since quitting: 42.8  Smokeless Tobacco Never  Tobacco Comments   QUIT I 1983    Diagnosis:  S/P TAVR (transcatheter aortic valve replacement)  ADL [facility]SD:  Pulmonary Assessment Scores     Row Name 11/20/23 0959         ADL UCSD   ADL Phase Entry     SOB Score total 88       CAT Score   CAT Score 18       mMRC Score   mMRC Score 4        Initial Exercise Prescription:  Initial Exercise Prescription - 11/20/23 1000       Date of Initial Exercise RX and Referring Provider   Date 11/20/23    Referring Provider Dr. Almond Cone    Expected Discharge Date 02/27/24      Oxygen    Oxygen  Continuous   NRB   Liters 15    Maintain Oxygen  Saturation 88% or higher      NuStep   Level 1    SPM 72    Minutes 30    METs 1.7      Prescription Details   Frequency (times per week) 2    Duration Progress to 30 minutes of continuous aerobic without signs/symptoms of physical distress      Intensity   THRR 40-80% of Max Heartrate 58-117    Ratings of Perceived Exertion 11-13    Perceived Dyspnea 0-4      Progression   Progression Continue progressive overload as per policy without signs/symptoms or physical distress.      Resistance Training   Training Prescription Yes    Weight blue bands    Reps 10-15          Discharge Exercise Prescription (Final  Exercise Prescription Changes):  Exercise Prescription Changes - 10/30/23 1029       Response to Exercise   Blood Pressure (Admit) 106/72    Blood Pressure (Exit) 104/62    Heart Rate (Admit) 60 bpm    Heart Rate (Exercise) 74 bpm    Heart Rate (Exit) 65 bpm    Oxygen  Saturation (Admit) 95 %    Oxygen  Saturation (Exercise) 96 %    Oxygen  Saturation (Exit) 100 %    Rating of Perceived Exertion (Exercise) 12    Perceived Dyspnea (Exercise) 0    Symptoms None    Comments [name] completed cardiac rehab.    Duration Continue with 30 min of aerobic exercise without signs/symptoms of physical distress.    Intensity THRR unchanged      Progression   Progression Continue to progress workloads to maintain intensity without signs/symptoms of physical distress.    Average METs 1.8      Resistance Training   Training Prescription Yes    Weight 2 lbs    Reps 10-15  Time 5 Minutes      Interval Training   Interval Training No      Oxygen    Oxygen  Continuous    Liters 15      NuStep   Level 1    SPM 71    Minutes 30    METs 1.8      Home Exercise Plan   Plans to continue exercise at Home (comment)   Walking   Frequency Add 3 additional days to program exercise sessions.    Initial Home Exercises Provided 10/25/23      Oxygen    Maintain Oxygen  Saturation 88% or higher          Functional Capacity:  6 Minute Walk     Row Name 08/29/23 1619 10/05/23 0915 11/20/23 1009     6 Minute Walk   Phase Initial  Used Go- Cart and O2 Initial Initial   Distance 330 feet 766 feet 625 feet   Walk Time 6 minutes 6 minutes 6 minutes   # of Rest Breaks 1  From 2:01 to 6:00 1  3:10-4:13 to increase O2 to 15L and get SaO2 to 90% 2  2:32, 5:40 increase O2 and NRB   MPH 0.63 1.5 1.18   METS 0.6 1.54 1.08   RPE 15 14 17    Perceived Dyspnea  3 3 4    VO2 Peak 2.2 5.4 3.77   Symptoms Yes (comment) Yes (comment) No   Comments Moderate to severe SOB, RPD = 3 Moderate to severe SOB, RPD = 3,  resolved with rest --   Resting HR 66 bpm 61 bpm 67 bpm   Resting BP 110/64 138/72 112/62   Resting Oxygen  Saturation  95 %  4L O2 96 %  8L O2 94 %   Exercise Oxygen  Saturation  during 6 min walk 82 % 88 % 86 %   Max Ex. HR 80 bpm 83 bpm 77 bpm   Max Ex. BP 128/80 150/80 132/70   2 Minute Post BP 118/68 124/70 122/70     Interval HR   1 Minute HR 80 75 73   2 Minute HR 79 81 76   3 Minute HR 72 86 72   4 Minute HR 70 78 74   5 Minute HR 66 76 77   6 Minute HR 64 79 66   2 Minute Post HR 64 65 70   Interval Heart Rate? Yes Yes Yes     Interval Oxygen    Interval Oxygen ? Yes Yes Yes   Baseline Oxygen  Saturation % 95 % 96 %  8L 94 %   1 Minute Oxygen  Saturation % 95 % 96 % 95 %   1 Minute Liters of Oxygen  8 L 10 L 15 L   2 Minute Oxygen  Saturation % 83 %  Stopped walk test 91 % 90 %   2 Minute Liters of Oxygen  8 L 10 L 15 L  NRB   3 Minute Oxygen  Saturation % 82 %  At rest 88 % 93 %   3 Minute Liters of Oxygen  8 L 10 L 15 L  NRB   4 Minute Oxygen  Saturation % 86 %  AT rest 90 % 96 %   4 Minute Liters of Oxygen  8 L 15 L 15 L  NRB   5 Minute Oxygen  Saturation % 91 % 92 % 90 %  86% at 5:40   5 Minute Liters of Oxygen  8 L 15 L 15 L  increased to 25  NRB   6 Minute Oxygen  Saturation % 95 % 93 % 95 %   6 Minute Liters of Oxygen  8 L 15 L 25 L  NRB   2 Minute Post Oxygen  Saturation % 97 % 99 % 91 %   2 Minute Post Liters of Oxygen  8 L 8 L 10 L  NRB      Psychological, QOL, Others - Outcomes: PHQ 2/9:    11/20/2023    9:17 AM 10/05/2023    3:04 PM 08/29/2023   11:57 AM 08/02/2023    1:23 PM 07/06/2023   11:07 AM  Depression screen PHQ 2/9  Decreased Interest 1 2 2  0 0  Down, Depressed, Hopeless 0 0 0 0 0  PHQ - 2 Score 1 2 2  0 0  Altered sleeping 0 0 0 0   Tired, decreased energy 1 2 2  0   Change in appetite 0 0 0 0   Feeling bad or failure about yourself  0 0 0 0   Trouble concentrating 0 0 0 0   Moving slowly or fidgety/restless 0 0 0 0   Suicidal thoughts 0 0 0 0   PHQ-9  Score 2 4 4  0   Difficult doing work/chores Not difficult at all Not difficult at all Not difficult at all Not difficult at all     Quality of Life:  Quality of Life - 10/05/23 1536       Quality of Life Scores   Health/Function Pre 22.23 %    Socioeconomic Pre 23.58 %    Psych/Spiritual Pre 27.14 %    Family Pre 27 %    GLOBAL Pre 24.24 %          Personal Goals: Goals established at orientation with interventions provided to work toward goal.  Personal Goals and Risk Factors at Admission - 11/20/23 0919       Core Components/Risk Factors/Patient Goals on Admission   Improve shortness of breath with ADL's Yes    Intervention Provide education, individualized exercise plan and daily activity instruction to help decrease symptoms of SOB with activities of daily living.    Expected Outcomes Short Term: Improve cardiorespiratory fitness to achieve a reduction of symptoms when performing ADLs;Long Term: Be able to perform more ADLs without symptoms or delay the onset of symptoms           Personal Goals Discharge:   Exercise Goals and Review:  Exercise Goals     Row Name 08/29/23 1625 10/05/23 1534 11/20/23 0922         Exercise Goals   Increase Physical Activity Yes Yes Yes     Intervention Provide advice, education, support and counseling about physical activity/exercise needs.;Develop an individualized exercise prescription for aerobic and resistive training based on initial evaluation findings, risk stratification, comorbidities and participant's personal goals. Provide advice, education, support and counseling about physical activity/exercise needs.;Develop an individualized exercise prescription for aerobic and resistive training based on initial evaluation findings, risk stratification, comorbidities and participant's personal goals. Provide advice, education, support and counseling about physical activity/exercise needs.;Develop an individualized exercise prescription  for aerobic and resistive training based on initial evaluation findings, risk stratification, comorbidities and participant's personal goals.     Expected Outcomes Short Term: Attend rehab on a regular basis to increase amount of physical activity.;Long Term: Add in home exercise to make exercise part of routine and to increase amount of physical activity.;Long Term: Exercising regularly at least 3-5 days a week. Short Term: Attend rehab  on a regular basis to increase amount of physical activity.;Long Term: Add in home exercise to make exercise part of routine and to increase amount of physical activity.;Long Term: Exercising regularly at least 3-5 days a week. Short Term: Attend rehab on a regular basis to increase amount of physical activity.;Long Term: Add in home exercise to make exercise part of routine and to increase amount of physical activity.;Long Term: Exercising regularly at least 3-5 days a week.     Increase Strength and Stamina Yes -- Yes     Intervention Provide advice, education, support and counseling about physical activity/exercise needs.;Develop an individualized exercise prescription for aerobic and resistive training based on initial evaluation findings, risk stratification, comorbidities and participant's personal goals. Provide advice, education, support and counseling about physical activity/exercise needs.;Develop an individualized exercise prescription for aerobic and resistive training based on initial evaluation findings, risk stratification, comorbidities and participant's personal goals. Provide advice, education, support and counseling about physical activity/exercise needs.;Develop an individualized exercise prescription for aerobic and resistive training based on initial evaluation findings, risk stratification, comorbidities and participant's personal goals.     Expected Outcomes Short Term: Increase workloads from initial exercise prescription for resistance, speed, and  METs.;Short Term: Perform resistance training exercises routinely during rehab and add in resistance training at home;Long Term: Improve cardiorespiratory fitness, muscular endurance and strength as measured by increased METs and functional capacity ( ) Short Term: Increase workloads from initial exercise prescription for resistance, speed, and METs.;Short Term: Perform resistance training exercises routinely during rehab and add in resistance training at home;Long Term: Improve cardiorespiratory fitness, muscular endurance and strength as measured by increased METs and functional capacity ( ) Short Term: Increase workloads from initial exercise prescription for resistance, speed, and METs.;Short Term: Perform resistance training exercises routinely during rehab and add in resistance training at home;Long Term: Improve cardiorespiratory fitness, muscular endurance and strength as measured by increased METs and functional capacity ( )     Able to understand and use rate of perceived exertion (RPE) scale Yes Yes Yes     Intervention Provide education and explanation on how to use RPE scale Provide education and explanation on how to use RPE scale Provide education and explanation on how to use RPE scale     Expected Outcomes Short Term: Able to use RPE daily in rehab to express subjective intensity level;Long Term:  Able to use RPE to guide intensity level when exercising independently Short Term: Able to use RPE daily in rehab to express subjective intensity level;Long Term:  Able to use RPE to guide intensity level when exercising independently Short Term: Able to use RPE daily in rehab to express subjective intensity level;Long Term:  Able to use RPE to guide intensity level when exercising independently     Able to understand and use Dyspnea scale Yes Yes Yes     Intervention Provide education and explanation on how to use Dyspnea scale Provide education and explanation on how to use Dyspnea scale  Provide education and explanation on how to use Dyspnea scale     Expected Outcomes Short Term: Able to use Dyspnea scale daily in rehab to express subjective sense of shortness of breath during exertion;Long Term: Able to use Dyspnea scale to guide intensity level when exercising independently Short Term: Able to use Dyspnea scale daily in rehab to express subjective sense of shortness of breath during exertion;Long Term: Able to use Dyspnea scale to guide intensity level when exercising independently Short Term: Able to use Dyspnea scale daily in  rehab to express subjective sense of shortness of breath during exertion;Long Term: Able to use Dyspnea scale to guide intensity level when exercising independently     Knowledge and understanding of Target Heart Rate Range (THRR) Yes Yes Yes     Intervention Provide education and explanation of THRR including how the numbers were predicted and where they are located for reference Provide education and explanation of THRR including how the numbers were predicted and where they are located for reference Provide education and explanation of THRR including how the numbers were predicted and where they are located for reference     Expected Outcomes Short Term: Able to state/look up THRR;Long Term: Able to use THRR to govern intensity when exercising independently;Short Term: Able to use daily as guideline for intensity in rehab Short Term: Able to state/look up THRR;Long Term: Able to use THRR to govern intensity when exercising independently;Short Term: Able to use daily as guideline for intensity in rehab Short Term: Able to state/look up THRR;Long Term: Able to use THRR to govern intensity when exercising independently;Short Term: Able to use daily as guideline for intensity in rehab     Understanding of Exercise Prescription Yes Yes Yes     Intervention Provide education, explanation, and written materials on patient's individual exercise prescription Provide  education, explanation, and written materials on patient's individual exercise prescription Provide education, explanation, and written materials on patient's individual exercise prescription     Expected Outcomes Short Term: Able to explain program exercise prescription;Long Term: Able to explain home exercise prescription to exercise independently Short Term: Able to explain program exercise prescription;Long Term: Able to explain home exercise prescription to exercise independently Short Term: Able to explain program exercise prescription;Long Term: Able to explain home exercise prescription to exercise independently        Exercise Goals Re-Evaluation:  Exercise Goals Re-Evaluation     Row Name 10/16/23 1135 10/25/23 1111 10/30/23 1130         Exercise Goal Re-Evaluation   Exercise Goals Review Increase Physical Activity;Increase Strength and Stamina;Able to understand and use Dyspnea scale;Able to understand and use rate of perceived exertion (RPE) scale Increase Physical Activity;Increase Strength and Stamina;Able to understand and use Dyspnea scale;Able to understand and use rate of perceived exertion (RPE) scale;Able to check pulse independently;Knowledge and understanding of Target Heart Rate Range (THRR);Understanding of Exercise Prescription Increase Physical Activity;Increase Strength and Stamina;Able to understand and use Dyspnea scale;Able to understand and use rate of perceived exertion (RPE) scale;Able to check pulse independently;Knowledge and understanding of Target Heart Rate Range (THRR);Understanding of Exercise Prescription     Comments Gerardo was able to understand and use the RPE scale appropriately. He has a pulse oximeter to monitor his oxygen  saturation and pulse. Reviewed exercise prescription with [place]. Randal completed the cardiac rehab program and will continue walking at home. He plans to enroll in pulmonary rehab once his wife recovers from upcoming surgery.      Expected Outcomes Eamon will exercise on telemetry monitor for 5 session, then start the pulmonary rehab program. [name] will walk 10-30 minutes, 2-3 days/week as tolerated. Brayn will conitnuing walking 10-30 minutes, 2-3 days/week as tolerated. He will continue exercise in pulmonary rehab.        Nutrition & Weight - Outcomes:  Pre Biometrics - 11/20/23 0910       Pre Biometrics   Grip Strength 29 kg           Nutrition:   Nutrition Discharge:   Education  Questionnaire Score:  Knowledge Questionnaire Score - 11/20/23 0928       Knowledge Questionnaire Score   Pre Score 15//18          Goals reviewed with patient; copy given to patient  completed 9 exercise and education sessions. Pt maintained good attendance and progressed nicely during their participation in rehab as evidenced by increased MET level. Dale was able to exercise without symptoms or ectopy. Gerhart exercised on 15 L/min of oxygen  to keep his oxygen  saturations above 90%. Mihir's wife is having surgery. Meldrick stopped the program early so that he can take care of his wife. Breken plans to exercise at pulmonary rehab once his wife has recovered. Montrez's care will be continued in pulmonary rehab due to his lung disease. We enjoyed working with Rutha while he was enrolled in cardiac rehab.Hadassah Elpidio Quan RN BSN

## 2023-11-03 ENCOUNTER — Ambulatory Visit (HOSPITAL_COMMUNITY)

## 2023-11-03 ENCOUNTER — Encounter (HOSPITAL_COMMUNITY)

## 2023-11-06 ENCOUNTER — Encounter (HOSPITAL_COMMUNITY)

## 2023-11-06 ENCOUNTER — Ambulatory Visit (HOSPITAL_COMMUNITY)

## 2023-11-07 ENCOUNTER — Other Ambulatory Visit: Payer: Self-pay | Admitting: *Deleted

## 2023-11-07 ENCOUNTER — Encounter: Payer: Self-pay | Admitting: Cardiovascular Disease

## 2023-11-07 ENCOUNTER — Other Ambulatory Visit (HOSPITAL_COMMUNITY): Payer: Self-pay

## 2023-11-07 MED ORDER — AMOXICILLIN 500 MG PO CAPS
ORAL_CAPSULE | ORAL | 0 refills | Status: DC
  Filled 2023-11-07: qty 4, 1d supply, fill #0

## 2023-11-07 NOTE — Progress Notes (Signed)
 Called and made patient and DPR, wife Apolinar aware per Dr. Wonda take Amoxil  2 g , 1 hour before dental procedure. Made wife aware medications has been sent tom pharmacy. Understanding verbalized.

## 2023-11-07 NOTE — Telephone Encounter (Signed)
 Yes. If no penicillin allergy, amoxil  2 G one hour prior to dental work. thanks

## 2023-11-08 ENCOUNTER — Encounter (HOSPITAL_COMMUNITY)

## 2023-11-08 ENCOUNTER — Ambulatory Visit (HOSPITAL_COMMUNITY)

## 2023-11-10 ENCOUNTER — Encounter (HOSPITAL_COMMUNITY)

## 2023-11-10 ENCOUNTER — Telehealth (HOSPITAL_COMMUNITY): Payer: Self-pay

## 2023-11-10 ENCOUNTER — Ambulatory Visit (HOSPITAL_COMMUNITY)

## 2023-11-10 NOTE — Telephone Encounter (Signed)
 Pt insurance is active and benefits verified through [facility] Medicare. Co-pay $15, DED $0/$0 met, out of pocket $3,900/$2,482.35 met, co-insurance 0%. No pre-authorization required. 11/10/2023 @ 1:58pm, spoke with Ronal DASEN., REF# [id number].

## 2023-11-10 NOTE — Telephone Encounter (Signed)
 Called patient to see if he was interested in participating in the Pulmonary Rehab Program. Patient will come in for orientation on 10/13 and will attend the 10:15 exercise class.  Sent MyChart message.

## 2023-11-13 ENCOUNTER — Ambulatory Visit (HOSPITAL_COMMUNITY)

## 2023-11-13 ENCOUNTER — Encounter (HOSPITAL_COMMUNITY)

## 2023-11-15 ENCOUNTER — Ambulatory Visit (HOSPITAL_COMMUNITY)

## 2023-11-15 ENCOUNTER — Encounter (HOSPITAL_COMMUNITY)

## 2023-11-17 ENCOUNTER — Ambulatory Visit (HOSPITAL_COMMUNITY)

## 2023-11-17 ENCOUNTER — Encounter (HOSPITAL_COMMUNITY)

## 2023-11-17 ENCOUNTER — Telehealth (HOSPITAL_COMMUNITY): Payer: Self-pay

## 2023-11-17 NOTE — Telephone Encounter (Signed)
 Called to confirm appt. Pt confirmed appt. Instructed pt on proper footwear. Gave directions along with department number.

## 2023-11-18 ENCOUNTER — Other Ambulatory Visit: Payer: Self-pay | Admitting: Family Medicine

## 2023-11-20 ENCOUNTER — Encounter (HOSPITAL_COMMUNITY)

## 2023-11-20 ENCOUNTER — Other Ambulatory Visit: Payer: Self-pay

## 2023-11-20 ENCOUNTER — Encounter (HOSPITAL_COMMUNITY)
Admission: RE | Admit: 2023-11-20 | Discharge: 2023-11-20 | Disposition: A | Discharge status: Home / Self Care | Source: Ambulatory Visit | Attending: Pulmonary Disease | Admitting: Pulmonary Disease

## 2023-11-20 ENCOUNTER — Encounter (HOSPITAL_COMMUNITY): Payer: Self-pay

## 2023-11-20 ENCOUNTER — Other Ambulatory Visit (HOSPITAL_COMMUNITY): Payer: Self-pay

## 2023-11-20 ENCOUNTER — Ambulatory Visit (HOSPITAL_COMMUNITY)

## 2023-11-20 VITALS — BP 112/62 | HR 67 | Ht 69.0 in | Wt 213.4 lb

## 2023-11-20 DIAGNOSIS — J84111 Idiopathic interstitial pneumonia, not otherwise specified: Secondary | ICD-10-CM | POA: Insufficient documentation

## 2023-11-20 MED ORDER — METOPROLOL TARTRATE 100 MG PO TABS
100.0000 mg | ORAL_TABLET | Freq: Two times a day (BID) | ORAL | 0 refills | Status: DC
  Filled 2023-11-20: qty 90, 45d supply, fill #0

## 2023-11-20 NOTE — Progress Notes (Signed)
 William [name] 73 y.o. male Pulmonary Rehab Orientation Note This patient who was referred to Pulmonary Rehab by Dr. Jesus with the diagnosis of ILD/PNA arrived today in Cardiac and Pulmonary Rehab. He arrived ambulatory with normal gait. He does carry portable oxygen . [employer] is the provider for their DME. Per patient, William [name] uses oxygen  continuously. Color good, skin warm and dry. Patient is oriented to time and place. Patient's medical history, psychosocial health, and medications reviewed. Psychosocial assessment reveals patient lives with spouse. William [name] is currently retired. Patient hobbies include watching tv and spending time with others. Patient reports his stress level is low. Areas of stress/anxiety include health. Patient does not exhibit signs of depression. PHQ2/9 score 1/2. William [name] shows good  coping skills with positive outlook on life. Offered emotional support and reassurance. Will continue to monitor. Physical assessment performed by William Levin RN. Please see their orientation physical assessment note. William [name] reports he  does take medications as prescribed. Patient states he  follows a low fat  diet. The patient reports no specific efforts to gain or lose weight.. Patient's weight will be monitored closely. Demonstration and practice of PLB using pulse oximeter. William [name] able to return demonstration satisfactorily. Safety and hand hygiene in the exercise area reviewed with patient. William [name] voices understanding of the information reviewed. Department expectations discussed with patient and achievable goals were set. The patient shows enthusiasm about attending the program and we look forward to working with William [name]. William [name] completed a 6 min walk test today and is scheduled to begin exercise on 11/28/23 at 10:15 am.  0900-1030 William JINNY Moats, William [name], William [name]

## 2023-11-20 NOTE — Progress Notes (Signed)
                            Pulmonary Rehab Orientation Physical Assessment Note  Patient Details  Name: William [name] MRN: [id number] Date of Birth: 1950/11/04 Referring Provider:   Conrad Ports Pulmonary Rehab Walk Test from 11/20/2023 in [facility]  Referring Provider Dr. Almond Cone   Physical assessment reveals patient is alert and oriented x 4.  Heart rate is normal, breath sounds clear to auscultation, no wheezes, rales, or rhonchi. Reports non-productive cough. Bowel sounds present x4 quads.  Pt denies abdominal discomfort, nausea, vomiting, diarrhea or constipation. Grip strength equal, strong. Distal pulses +2; +1 swelling to lower extremities.   Ronal Levin, RN, BSN

## 2023-11-20 NOTE — Progress Notes (Signed)
 Pulmonary Individual Treatment Plan  Patient Details  Name: William [name] MRN: [id number] Date of Birth: [birth year] Referring Provider:   Conrad Ports Pulmonary Rehab Walk Test from 11/20/2023 in [facility]  Referring Provider Dr. 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 Take 2 grams (4x 500mg  tablets) 1 hour before procedure., Disp: 4 capsule, Rfl: 0   aspirin EC 81 MG tablet, Take 81 mg by mouth daily. Swallow whole., Disp: , Rfl:    furosemide  (LASIX ) 20 MG tablet, Take 1 tablet (20 mg total) by mouth daily., Disp: 90 tablet, Rfl: 3   Magnesium 400 MG TABS, Take 400 mg by mouth daily., Disp: , Rfl:    metFORMIN  (GLUCOPHAGE ) 500 MG tablet, Take 1 tablet (500 mg total) by mouth 2 (two) times daily with a meal., Disp: 180 tablet, Rfl: 3   metoprolol  tartrate (LOPRESSOR ) 100 MG tablet, Take 1 tablet (100 mg total) by mouth 2 (two) times daily., Disp: 90 tablet, Rfl: 0   mycophenolate  (CELLCEPT ) 250 MG capsule, Take 2 capsules by mouth in the morning and 1 capsule in the evening., Disp: 270 capsule, Rfl: 3   pantoprazole  (PROTONIX ) 40 MG tablet, Take 1 tablet (40 mg total) by mouth daily., Disp: 90 tablet, Rfl:  3   rosuvastatin  (CRESTOR ) 10 MG tablet, Take 1 tablet (10 mg total) by mouth daily., Disp: 90 tablet, Rfl: 3   tacrolimus  (PROGRAF ) 1 MG capsule, Take 2 capsules (2 mg total) by mouth every morning AND 1 capsule (1 mg total) every evening., Disp: 270 capsule, Rfl: 3   acitretin  (SORIATANE ) 10 MG capsule, Take 1 capsule by mouth daily (Patient not taking: Reported on 11/20/2023), Disp: 30 capsule, Rfl: 5   triamcinolone  cream (KENALOG ) 0.1 %, Apply 2 times a day to rash. Follow with CeraVe Cream.  Stop when smooth. (Patient not taking: Reported on 11/20/2023), Disp: 80 g, Rfl: 2 No current facility-administered medications for this encounter.  Facility-Administered Medications Ordered in Other Encounters:    heparin  lock flush 100 unit/mL, 500 Units, Intracatheter, Daily PRN, Lanny Callander, MD   sodium chloride  flush (NS) 0.9 % injection 10 mL, 10 mL, Intracatheter, PRN, Lanny Callander, MD  Past Medical History: Past Medical History:  Diagnosis Date   AAA (abdominal aortic aneurysm) 03/2014   repaired 2020   Ascending aortic aneurysm    unrepaired. Stable at 4.3 cm on imaging by [facility] thoracic surg 10/2019 and 10/2020.   Bilateral renal cysts    simple (03/2014 MRI)   CAD (coronary artery disease)    Cholelithiases 2018   asymptomatic   Chronic diastolic heart failure (HCC)    Chronic renal insufficiency, stage 3 (moderate) 2022   baseline sCr 1.3-1.5 (GFR 55)   COVID-19 virus infection 02/2020  sotrovimab  infusion   Diabetes mellitus with complication (HCC) 07/2014   A1c 6.8%. A1c 5.4% 11/2019   Diverticulosis    Gout    initially dx'd by diag arthrocentesis of elbow effusion in winter 2021, 3 episodes, all came after being started on chlorthalidone ->I d/c'd chlorthal.   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  Improving + off oxygen  as of 3 mo s/p liver transplant.  Doing well/resolved as of 02/2020 [facility] pulm f/u->they'll continue to follow. No worsening on CT imaging at [facility] 03/2023    History of blood transfusion 2018 X 4 dates   Chronic GI blood loss of unknown location despite full GI endoscopic eval, etc   History of liver transplant (HCC) 07/2019   [facility]   Hyperkalemia    Lokelma    Hyperlipemia, mixed    elevated LFTs when on statins.     Hypertension    Cr bump 04/01/16 so I changed benicar -hct to benicar  plain and added amlodipine  5 mg.   Increased prostate specific antigen (PSA) velocity 2021   0.11 to 4.77 from 2020 to 2021 (he got a liver transplant and was put on antirejection meds between these psa checks).   Iron  deficiency anemia 05/2016   Acute blood loss anemia: hospitalized, required transfusion x 3 U: colonoscopy and capsule study unrevealing.  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  W/u for ? hemolytic anemia to be pursued by hematologist as outpt.  Dr. Charlean, GI at [facility] following, too---he rec'd onc do bone marrow bx as of Jan 2019   Iron  deficiency anemia due to chronic blood loss 2018/19   GI: transfusions x >20 required; multiple endoscopies and bleeding scans unrevealing. Lysteda  and octreotide  + monthly iron  infusions as of 04/2017. Iron  infusions changed to every other month as of 09/2018 hem f/u.   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  Changes noted on all CXRs in 2018/2019.  Liver Transplant eval 03/2018->mild restriction on PFTs but no obstruction.  See further details in [facility] pulm fibrosis section   Microscopic hematuria    Eval unremarkable by Dr. Maryanne.   Nonmelanoma skin cancer 08/15/2018   2020 BCC nose, excised.  2022 R side of  neck->mohs   Obesity    Pulmonary fibrosis (HCC)    PFTs: restrictive lung dz ([facility] Liver transplant eval 05/2018). [facility] pulm eval felt this was likely hepatopulmonary syndrome->causing his hypoxia->low likelihood of progression (as of 10/04/18 transplant clinic f/u). Pulm rehab helping as of 2020/2021. OFF oxygen  as of 10/2019 transp f/u.   Pulmonary fibrosis (HCC)    Gradual worsening of objective testing 2020 05/2023, has had return of oxygen  requirement, diffuse parenchymal lung disease of unclear etiology.   Spleen enlarged     Tobacco Use: Social History   Tobacco Use  Smoking Status Former   Current packs/day: 0.00   Average packs/day: 0.5 packs/day for 17.0 years (8.5 ttl pk-yrs)   Types: Cigarettes   Start date: 27   Quit date: 46   Years since quitting: 42.8  Smokeless Tobacco Never  Tobacco Comments   QUIT I 1983  Labs: Review Flowsheet  More data exists      Latest Ref Rng & Units 01/13/2022 04/20/2022 07/21/2022 10/24/2022 01/26/2023  Labs for ITP Cardiac and Pulmonary Rehab  Cholestrol 0 - 200 mg/dL 885  - 94  - 90   LDL (calc) 0 - 99 mg/dL 53  - 41  - 40   HDL-C >39.00 mg/dL 74.39  - 74.09  - 75.29   Trlycerides 0.0 - 149.0 mg/dL 820.9  - 863.9  - 871.9   Hemoglobin A1c 5.7 - 6.4 % 0.0 - 7.0 % 4.0 - 5.6 % 4.0 - 5.6 % 6.3  6.3  6.3  6.3  7.8  7.8  7.8  7.8  5.5  5.5  5.5  5.5  5.7  5.7  5.7  5.7  5.6  5.6  5.6  5.6     Details       Multiple values from one day are sorted in reverse-chronological order         Capillary Blood Glucose: Lab Results  Component Value Date   GLUCAP 91 10/18/2023   GLUCAP 151 (H) 10/18/2023   GLUCAP 87 10/16/2023   GLUCAP 171 (H) 10/16/2023   GLUCAP 200 (H) 10/05/2023     Pulmonary Assessment Scores:  Pulmonary Assessment Scores     Row Name 11/20/23 0959         ADL UCSD   ADL Phase Entry     SOB Score total 88       CAT Score   CAT Score 18       mMRC Score   mMRC Score 4        UCSD: Self-administered rating of dyspnea associated with activities of daily living (ADLs) 6-point scale (0 = not at all to 5 = maximal or unable to do because of breathlessness)  Scoring Scores range from 0 to 120.  Minimally important difference is 5 units  CAT: CAT can identify the health impairment of COPD patients and is better correlated with disease progression.  CAT has a scoring range of zero to 40. The CAT score is classified into four groups of low (less than 10), medium (10 - 20), high (21-30) and very high (31-40) based on the impact level of disease on health status. A CAT score over 10 suggests significant symptoms.  A worsening CAT score could be explained by an exacerbation, poor medication adherence, poor inhaler technique, or progression of COPD or comorbid conditions.  CAT MCID is 2 points  mMRC: mMRC (Modified Medical Research Council) Dyspnea Scale is used to assess the degree of baseline functional disability in patients of respiratory disease due to dyspnea. No minimal important difference is established. A decrease in score of 1 point or greater is considered a positive change.   Pulmonary Function Assessment:  Pulmonary Function Assessment - 11/20/23 0921       Breath   Bilateral Breath Sounds Clear    Shortness of Breath Yes;Limiting activity          Exercise Target Goals: Exercise Program Goal: Individual exercise prescription set using results from initial 6 min walk test and THRR while considering  patient's activity barriers and safety.   Exercise Prescription Goal: Initial exercise prescription builds to 30-45 minutes a day of aerobic activity, 2-3 days per week.  Home exercise guidelines will be given to patient during program as part of exercise prescription that the participant will acknowledge.  Activity Barriers & Risk Stratification:  Activity Barriers & Cardiac Risk Stratification -  10/05/23 1534       Activity Barriers & Cardiac  Risk Stratification   Activity Barriers Balance Concerns;Joint Problems;Arthritis;Deconditioning;Shortness of Breath;Neck/Spine Problems    Cardiac Risk Stratification High          6 Minute Walk:  6 Minute Walk     Row Name 08/29/23 1619 10/05/23 0915 11/20/23 1009     6 Minute Walk   Phase Initial  Used Go- Cart and O2 Initial Initial   Distance 330 feet 766 feet 625 feet   Walk Time 6 minutes 6 minutes 6 minutes   # of Rest Breaks 1  From 2:01 to 6:00 1  3:10-4:13 to increase O2 to 15L and get SaO2 to 90% 2  2:32, 5:40 increase O2 and NRB   MPH 0.63 1.5 1.18   METS 0.6 1.54 1.08   RPE 15 14 17    Perceived Dyspnea  3 3 4    VO2 Peak 2.2 5.4 3.77   Symptoms Yes (comment) Yes (comment) No   Comments Moderate to severe SOB, RPD = 3 Moderate to severe SOB, RPD = 3, resolved with rest --   Resting HR 66 bpm 61 bpm 67 bpm   Resting BP 110/64 138/72 112/62   Resting Oxygen  Saturation  95 %  4L O2 96 %  8L O2 94 %   Exercise Oxygen  Saturation  during 6 min walk 82 % 88 % 86 %   Max Ex. HR 80 bpm 83 bpm 77 bpm   Max Ex. BP 128/80 150/80 132/70   2 Minute Post BP 118/68 124/70 122/70     Interval HR   1 Minute HR 80 75 73   2 Minute HR 79 81 76   3 Minute HR 72 86 72   4 Minute HR 70 78 74   5 Minute HR 66 76 77   6 Minute HR 64 79 66   2 Minute Post HR 64 65 70   Interval Heart Rate? Yes Yes Yes     Interval Oxygen    Interval Oxygen ? Yes Yes Yes   Baseline Oxygen  Saturation % 95 % 96 %  8L 94 %   1 Minute Oxygen  Saturation % 95 % 96 % 95 %   1 Minute Liters of Oxygen  8 L 10 L 15 L   2 Minute Oxygen  Saturation % 83 %  Stopped walk test 91 % 90 %   2 Minute Liters of Oxygen  8 L 10 L 15 L  NRB   3 Minute Oxygen  Saturation % 82 %  At rest 88 % 93 %   3 Minute Liters of Oxygen  8 L 10 L 15 L  NRB   4 Minute Oxygen  Saturation % 86 %  AT rest 90 % 96 %   4 Minute Liters of Oxygen  8 L 15 L 15 L  NRB   5 Minute Oxygen  Saturation % 91 % 92 % 90 %  86% at 5:40   5 Minute Liters of  Oxygen  8 L 15 L 15 L  increased to 25 NRB   6 Minute Oxygen  Saturation % 95 % 93 % 95 %   6 Minute Liters of Oxygen  8 L 15 L 25 L  NRB   2 Minute Post Oxygen  Saturation % 97 % 99 % 91 %   2 Minute Post Liters of Oxygen  8 L 8 L 10 L  NRB      Oxygen  Initial Assessment:  Oxygen  Initial Assessment - 11/20/23 0920  Home Oxygen    Home Oxygen  Device E-Tanks;Home Concentrator    Sleep Oxygen  Prescription Continuous    Liters per minute 5    Home Exercise Oxygen  Prescription Continuous    Liters per minute 15    Home Resting Oxygen  Prescription Continuous    Liters per minute 10    Compliance with Home Oxygen  Use Yes      Initial 6 min Walk   Oxygen  Used Continuous    Liters per minute 15   NRB     Program Oxygen  Prescription   Program Oxygen  Prescription Continuous    Liters per minute 15   NRB mask     Intervention   Short Term Goals To learn and exhibit compliance with exercise, home and travel O2 prescription;To learn and understand importance of monitoring SPO2 with pulse oximeter and demonstrate accurate use of the pulse oximeter.;To learn and understand importance of maintaining oxygen  saturations>88%;To learn and demonstrate proper pursed lip breathing techniques or other breathing techniques. ;To learn and demonstrate proper use of respiratory medications    Long  Term Goals Exhibits compliance with exercise, home  and travel O2 prescription;Verbalizes importance of monitoring SPO2 with pulse oximeter and return demonstration;Maintenance of O2 saturations>88%;Exhibits proper breathing techniques, such as pursed lip breathing or other method taught during program session;Compliance with respiratory medication;Demonstrates proper use of MDI's          Oxygen  Re-Evaluation:   Oxygen  Discharge (Final Oxygen  Re-Evaluation):   Initial Exercise Prescription:  Initial Exercise Prescription - 11/20/23 1000       Date of Initial Exercise RX and Referring Provider   Date  11/20/23    Referring Provider Dr. Almond Cone    Expected Discharge Date 02/27/24      Oxygen    Oxygen  Continuous   NRB   Liters 15    Maintain Oxygen  Saturation 88% or higher      NuStep   Level 1    SPM 72    Minutes 30    METs 1.7      Prescription Details   Frequency (times per week) 2    Duration Progress to 30 minutes of continuous aerobic without signs/symptoms of physical distress      Intensity   THRR 40-80% of Max Heartrate 58-117    Ratings of Perceived Exertion 11-13    Perceived Dyspnea 0-4      Progression   Progression Continue progressive overload as per policy without signs/symptoms or physical distress.      Resistance Training   Training Prescription Yes    Weight blue bands    Reps 10-15          Perform Capillary Blood Glucose checks as needed.  Exercise Prescription Changes:   Exercise Prescription Changes     Row Name 10/16/23 1029 10/30/23 1029           Response to Exercise   Blood Pressure (Admit) 128/70 106/72      Blood Pressure (Exercise) 138/68 --      Blood Pressure (Exit) 106/60 104/62      Heart Rate (Admit) 70 bpm 60 bpm      Heart Rate (Exercise) 85 bpm 74 bpm      Heart Rate (Exit) 73 bpm 65 bpm      Oxygen  Saturation (Admit) 98 % 95 %      Oxygen  Saturation (Exercise) 95 % 96 %      Oxygen  Saturation (Exit) 99 % 100 %      Rating  of Perceived Exertion (Exercise) 13 12      Perceived Dyspnea (Exercise) 0 0      Symptoms None None      Comments Off to a good start with exercise. Aydon completed cardiac rehab.      Duration Continue with 30 min of aerobic exercise without signs/symptoms of physical distress. Continue with 30 min of aerobic exercise without signs/symptoms of physical distress.      Intensity THRR unchanged THRR unchanged        Progression   Progression Continue to progress workloads to maintain intensity without signs/symptoms of physical distress. Continue to progress workloads to maintain  intensity without signs/symptoms of physical distress.      Average METs 2.1 1.8        Resistance Training   Training Prescription Yes Yes      Weight 2 lbs 2 lbs      Reps 10-15 10-15      Time 5 Minutes 5 Minutes        Interval Training   Interval Training No No        Oxygen    Oxygen  Continuous Continuous      Liters 15 15        NuStep   Level 1 1      SPM 79 71      Minutes 30 30      METs 2.1 1.8        Home Exercise Plan   Plans to continue exercise at -- Home (comment)  Walking      Frequency -- Add 3 additional days to program exercise sessions.      Initial Home Exercises Provided -- 10/25/23        Oxygen    Maintain Oxygen  Saturation -- 88% or higher         Exercise Comments:   Exercise Comments     Row Name 10/16/23 1135 10/25/23 1111 10/30/23 1130       Exercise Comments Deaundre tolerated low intensity exercise well without symptoms. SaO2 maintained on 15 L O2. Reviewed home exercise guidelines and goals with Solmon. Cortlandt completed cardiac rehab and will continue walking at home at this time.        Exercise Goals and Review:   Exercise Goals     Row Name 08/29/23 1625 10/05/23 1534 11/20/23 0922         Exercise Goals   Increase Physical Activity Yes Yes Yes     Intervention Provide advice, education, support and counseling about physical activity/exercise needs.;Develop an individualized exercise prescription for aerobic and resistive training based on initial evaluation findings, risk stratification, comorbidities and participant's personal goals. Provide advice, education, support and counseling about physical activity/exercise needs.;Develop an individualized exercise prescription for aerobic and resistive training based on initial evaluation findings, risk stratification, comorbidities and participant's personal goals. Provide advice, education, support and counseling about physical activity/exercise needs.;Develop an individualized exercise  prescription for aerobic and resistive training based on initial evaluation findings, risk stratification, comorbidities and participant's personal goals.     Expected Outcomes Short Term: Attend rehab on a regular basis to increase amount of physical activity.;Long Term: Add in home exercise to make exercise part of routine and to increase amount of physical activity.;Long Term: Exercising regularly at least 3-5 days a week. Short Term: Attend rehab on a regular basis to increase amount of physical activity.;Long Term: Add in home exercise to make exercise part of routine and to increase amount of physical activity.;Long Term:  Exercising regularly at least 3-5 days a week. Short Term: Attend rehab on a regular basis to increase amount of physical activity.;Long Term: Add in home exercise to make exercise part of routine and to increase amount of physical activity.;Long Term: Exercising regularly at least 3-5 days a week.     Increase Strength and Stamina Yes -- Yes     Intervention Provide advice, education, support and counseling about physical activity/exercise needs.;Develop an individualized exercise prescription for aerobic and resistive training based on initial evaluation findings, risk stratification, comorbidities and participant's personal goals. Provide advice, education, support and counseling about physical activity/exercise needs.;Develop an individualized exercise prescription for aerobic and resistive training based on initial evaluation findings, risk stratification, comorbidities and participant's personal goals. Provide advice, education, support and counseling about physical activity/exercise needs.;Develop an individualized exercise prescription for aerobic and resistive training based on initial evaluation findings, risk stratification, comorbidities and participant's personal goals.     Expected Outcomes Short Term: Increase workloads from initial exercise prescription for resistance,  speed, and METs.;Short Term: Perform resistance training exercises routinely during rehab and add in resistance training at home;Long Term: Improve cardiorespiratory fitness, muscular endurance and strength as measured by increased METs and functional capacity ( ) Short Term: Increase workloads from initial exercise prescription for resistance, speed, and METs.;Short Term: Perform resistance training exercises routinely during rehab and add in resistance training at home;Long Term: Improve cardiorespiratory fitness, muscular endurance and strength as measured by increased METs and functional capacity ( ) Short Term: Increase workloads from initial exercise prescription for resistance, speed, and METs.;Short Term: Perform resistance training exercises routinely during rehab and add in resistance training at home;Long Term: Improve cardiorespiratory fitness, muscular endurance and strength as measured by increased METs and functional capacity ( )     Able to understand and use rate of perceived exertion (RPE) scale Yes Yes Yes     Intervention Provide education and explanation on how to use RPE scale Provide education and explanation on how to use RPE scale Provide education and explanation on how to use RPE scale     Expected Outcomes Short Term: Able to use RPE daily in rehab to express subjective intensity level;Long Term:  Able to use RPE to guide intensity level when exercising independently Short Term: Able to use RPE daily in rehab to express subjective intensity level;Long Term:  Able to use RPE to guide intensity level when exercising independently Short Term: Able to use RPE daily in rehab to express subjective intensity level;Long Term:  Able to use RPE to guide intensity level when exercising independently     Able to understand and use Dyspnea scale Yes Yes Yes     Intervention Provide education and explanation on how to use Dyspnea scale Provide education and explanation on how to use  Dyspnea scale Provide education and explanation on how to use Dyspnea scale     Expected Outcomes Short Term: Able to use Dyspnea scale daily in rehab to express subjective sense of shortness of breath during exertion;Long Term: Able to use Dyspnea scale to guide intensity level when exercising independently Short Term: Able to use Dyspnea scale daily in rehab to express subjective sense of shortness of breath during exertion;Long Term: Able to use Dyspnea scale to guide intensity level when exercising independently Short Term: Able to use Dyspnea scale daily in rehab to express subjective sense of shortness of breath during exertion;Long Term: Able to use Dyspnea scale to guide intensity level when exercising independently     Knowledge  and understanding of Target Heart Rate Range (THRR) Yes Yes Yes     Intervention Provide education and explanation of THRR including how the numbers were predicted and where they are located for reference Provide education and explanation of THRR including how the numbers were predicted and where they are located for reference Provide education and explanation of THRR including how the numbers were predicted and where they are located for reference     Expected Outcomes Short Term: Able to state/look up THRR;Long Term: Able to use THRR to govern intensity when exercising independently;Short Term: Able to use daily as guideline for intensity in rehab Short Term: Able to state/look up THRR;Long Term: Able to use THRR to govern intensity when exercising independently;Short Term: Able to use daily as guideline for intensity in rehab Short Term: Able to state/look up THRR;Long Term: Able to use THRR to govern intensity when exercising independently;Short Term: Able to use daily as guideline for intensity in rehab     Understanding of Exercise Prescription Yes Yes Yes     Intervention Provide education, explanation, and written materials on patient's individual exercise prescription  Provide education, explanation, and written materials on patient's individual exercise prescription Provide education, explanation, and written materials on patient's individual exercise prescription     Expected Outcomes Short Term: Able to explain program exercise prescription;Long Term: Able to explain home exercise prescription to exercise independently Short Term: Able to explain program exercise prescription;Long Term: Able to explain home exercise prescription to exercise independently Short Term: Able to explain program exercise prescription;Long Term: Able to explain home exercise prescription to exercise independently        Exercise Goals Re-Evaluation :  Exercise Goals Re-Evaluation     Row Name 10/16/23 1135 10/25/23 1111 10/30/23 1130         Exercise Goal Re-Evaluation   Exercise Goals Review Increase Physical Activity;Increase Strength and Stamina;Able to understand and use Dyspnea scale;Able to understand and use rate of perceived exertion (RPE) scale Increase Physical Activity;Increase Strength and Stamina;Able to understand and use Dyspnea scale;Able to understand and use rate of perceived exertion (RPE) scale;Able to check pulse independently;Knowledge and understanding of Target Heart Rate Range (THRR);Understanding of Exercise Prescription Increase Physical Activity;Increase Strength and Stamina;Able to understand and use Dyspnea scale;Able to understand and use rate of perceived exertion (RPE) scale;Able to check pulse independently;Knowledge and understanding of Target Heart Rate Range (THRR);Understanding of Exercise Prescription     Comments Semaje was able to understand and use the RPE scale appropriately. He has a pulse oximeter to monitor his oxygen  saturation and pulse. Reviewed exercise prescription with Douglass. Wenceslao completed the cardiac rehab program and will continue walking at home. He plans to enroll in pulmonary rehab once his wife recovers from upcoming surgery.      Expected Outcomes Zavier will exercise on telemetry monitor for 5 session, then start the pulmonary rehab program. Brier will walk 10-30 minutes, 2-3 days/week as tolerated. Kamali will conitnuing walking 10-30 minutes, 2-3 days/week as tolerated. He will continue exercise in pulmonary rehab.        Discharge Exercise Prescription (Final Exercise Prescription Changes):  Exercise Prescription Changes - 10/30/23 1029       Response to Exercise   Blood Pressure (Admit) 106/72    Blood Pressure (Exit) 104/62    Heart Rate (Admit) 60 bpm    Heart Rate (Exercise) 74 bpm    Heart Rate (Exit) 65 bpm    Oxygen  Saturation (Admit) 95 %  Oxygen  Saturation (Exercise) 96 %    Oxygen  Saturation (Exit) 100 %    Rating of Perceived Exertion (Exercise) 12    Perceived Dyspnea (Exercise) 0    Symptoms None    Comments Husam completed cardiac rehab.    Duration Continue with 30 min of aerobic exercise without signs/symptoms of physical distress.    Intensity THRR unchanged      Progression   Progression Continue to progress workloads to maintain intensity without signs/symptoms of physical distress.    Average METs 1.8      Resistance Training   Training Prescription Yes    Weight 2 lbs    Reps 10-15    Time 5 Minutes      Interval Training   Interval Training No      Oxygen    Oxygen  Continuous    Liters 15      NuStep   Level 1    SPM 71    Minutes 30    METs 1.8      Home Exercise Plan   Plans to continue exercise at Home (comment)   Walking   Frequency Add 3 additional days to program exercise sessions.    Initial Home Exercises Provided 10/25/23      Oxygen    Maintain Oxygen  Saturation 88% or higher          Nutrition:  Target Goals: Understanding of nutrition guidelines, daily intake of sodium 1500mg , cholesterol 200mg , calories 30% from fat and 7% or less from saturated fats, daily to have 5 or more servings of fruits and vegetables.  Biometrics:  Pre Biometrics  - 11/20/23 0910       Pre Biometrics   Grip Strength 29 kg           Nutrition Therapy Plan and Nutrition Goals:   Nutrition Assessments:  MEDIFICTS Score Key: >=70 Need to make dietary changes  40-70 Heart Healthy Diet <= 40 Therapeutic Level Cholesterol Diet  Flowsheet Row INTENSIVE CARDIAC REHAB ORIENT from 10/05/2023 in [facility]  Picture Your Plate Total Score on Admission 64   Picture Your Plate Scores: <59 Unhealthy dietary pattern with much room for improvement. 41-50 Dietary pattern unlikely to meet recommendations for good health and room for improvement. 51-60 More healthful dietary pattern, with some room for improvement.  >60 Healthy dietary pattern, although there may be some specific behaviors that could be improved.    Nutrition Goals Re-Evaluation:   Nutrition Goals Discharge (Final Nutrition Goals Re-Evaluation):   Psychosocial: Target Goals: Acknowledge presence or absence of significant depression and/or stress, maximize coping skills, provide positive support system. Participant is able to verbalize types and ability to use techniques and skills needed for reducing stress and depression.  Initial Review & Psychosocial Screening:  Initial Psych Review & Screening - 11/20/23 0918       Initial Review   Current issues with None Identified      Family Dynamics   Good Support System? Yes   Basim has his wife, 3 children and 3 grandchildren for support   Comments wife, 3 children, 3 grandchildren      Barriers   Psychosocial barriers to participate in program There are no identifiable barriers or psychosocial needs.      Screening Interventions   Interventions Encouraged to exercise          Quality of Life Scores:  Quality of Life - 10/05/23 1536  Quality of Life Scores   Health/Function Pre 22.23 %    Socioeconomic Pre 23.58 %    Psych/Spiritual Pre 27.14 %    Family  Pre 27 %    GLOBAL Pre 24.24 %         Scores of 19 and below usually indicate a poorer quality of life in these areas.  A difference of  2-3 points is a clinically meaningful difference.  A difference of 2-3 points in the total score of the Quality of Life Index has been associated with significant improvement in overall quality of life, self-image, physical symptoms, and general health in studies assessing change in quality of life.  PHQ-9: Review Flowsheet  More data exists      11/20/2023 10/05/2023 08/29/2023 08/02/2023 07/06/2023  Depression screen PHQ 2/9  Decreased Interest 1 2 2  0 0  Down, Depressed, Hopeless 0 0 0 0 0  PHQ - 2 Score 1 2 2  0 0  Altered sleeping 0 0 0 0 -  Tired, decreased energy 1 2 2  0 -  Change in appetite 0 0 0 0 -  Feeling bad or failure about yourself  0 0 0 0 -  Trouble concentrating 0 0 0 0 -  Moving slowly or fidgety/restless 0 0 0 0 -  Suicidal thoughts 0 0 0 0 -  PHQ-9 Score 2 4 4  0 -  Difficult doing work/chores Not difficult at all Not difficult at all Not difficult at all Not difficult at all -   Interpretation of Total Score  Total Score Depression Severity:  1-4 = Minimal depression, 5-9 = Mild depression, 10-14 = Moderate depression, 15-19 = Moderately severe depression, 20-27 = Severe depression   Psychosocial Evaluation and Intervention:  Psychosocial Evaluation - 11/20/23 0919       Psychosocial Evaluation & Interventions   Interventions Encouraged to exercise with the program and follow exercise prescription    Comments No barriers or concerns were identified at this time.    Expected Outcomes For Javonn to continue to be free of barriers or psychosocial concerns while participating in pulmonary rehab.    Continue Psychosocial Services  No Follow up required          Psychosocial Re-Evaluation:   Psychosocial Discharge (Final Psychosocial Re-Evaluation):   Education: Education Goals: Education classes will be provided on a  weekly basis, covering required topics. Participant will state understanding/return demonstration of topics presented.  Learning Barriers/Preferences:  Learning Barriers/Preferences - 11/20/23 0919       Learning Barriers/Preferences   Learning Barriers Hearing;Sight    Learning Preferences Skilled Demonstration;Group Instruction;Individual Instruction          Education Topics: Know Your Numbers Group instruction that is supported by a PowerPoint presentation. Instructor discusses importance of knowing and understanding resting, exercise, and post-exercise oxygen  saturation, heart rate, and blood pressure. Oxygen  saturation, heart rate, blood pressure, rating of perceived exertion, and dyspnea are reviewed along with a normal range for these values.    Exercise for the Pulmonary Patient Group instruction that is supported by a PowerPoint presentation. Instructor discusses benefits of exercise, core components of exercise, frequency, duration, and intensity of an exercise routine, importance of utilizing pulse oximetry during exercise, safety while exercising, and options of places to exercise outside of rehab.    MET Level  Group instruction provided by PowerPoint, verbal discussion, and written material to support subject matter. Instructor reviews what METs are and how to increase METs.    Pulmonary Medications Verbally  interactive group education provided by instructor with focus on inhaled medications and proper administration. Flowsheet Row PULMONARY REHAB OTHER RESPIRATORY from 09/19/2019 in [facility] Heart, Vascular, & Lung Health  Date 09/19/19  Educator handout  Instruction Review Code 2- Demonstrated Understanding    Anatomy and Physiology of the Respiratory System Group instruction provided by PowerPoint, verbal discussion, and written material to support subject matter. Instructor reviews respiratory cycle and anatomical components of the  respiratory system and their functions. Instructor also reviews differences in obstructive and restrictive respiratory diseases with examples of each.    Oxygen  Safety Group instruction provided by PowerPoint, verbal discussion, and written material to support subject matter. There is an overview of "What is Oxygen " and "Why do we need it".  Instructor also reviews how to create a safe environment for oxygen  use, the importance of using oxygen  as prescribed, and the risks of noncompliance. There is a brief discussion on traveling with oxygen  and resources the patient may utilize.   Oxygen  Use Group instruction provided by PowerPoint, verbal discussion, and written material to discuss how supplemental oxygen  is prescribed and different types of oxygen  supply systems. Resources for more information are provided.    Breathing Techniques Group instruction that is supported by demonstration and informational handouts. Instructor discusses the benefits of pursed lip and diaphragmatic breathing and detailed demonstration on how to perform both.     Risk Factor Reduction Group instruction that is supported by a PowerPoint presentation. Instructor discusses the definition of a risk factor, different risk factors for pulmonary disease, and how the heart and lungs work together. Flowsheet Row PULMONARY REHAB OTHER RESPIRATORY from 01/29/2019 in [facility] for Heart, Vascular, & Lung Health  Date 01/08/19  Educator --  [Handout]    Pulmonary Diseases Group instruction provided by PowerPoint, verbal discussion, and written material to support subject matter. Instructor gives an overview of the different type of pulmonary diseases. There is also a discussion on risk factors and symptoms as well as ways to manage the diseases.   Stress and Energy Conservation Group instruction provided by PowerPoint, verbal discussion, and written material to support subject matter. Instructor  gives an overview of stress and the impact it can have on the body. Instructor also reviews ways to reduce stress. There is also a discussion on energy conservation and ways to conserve energy throughout the day.   Warning Signs and Symptoms Group instruction provided by PowerPoint, verbal discussion, and written material to support subject matter. Instructor reviews warning signs and symptoms of stroke, heart attack, cold and flu. Instructor also reviews ways to prevent the spread of infection.   Other Education Group or individual verbal, written, or video instructions that support the educational goals of the pulmonary rehab program. Flowsheet Row PULMONARY REHAB OTHER RESPIRATORY from 11/07/2019 in [facility] for Heart, Vascular, & Lung Health  Date 11/07/19  [facility] your numbers]  Educator Handout  Instruction Review Code 2- Demonstrated Understanding     Knowledge Questionnaire Score:  Knowledge Questionnaire Score - 11/20/23 0928       Knowledge Questionnaire Score   Pre Score 15//18          Core Components/Risk Factors/Patient Goals at Admission:  Personal Goals and Risk Factors at Admission - 11/20/23 0919       Core Components/Risk Factors/Patient Goals on Admission   Improve shortness of breath with ADL's Yes    Intervention Provide education, individualized exercise plan and  daily activity instruction to help decrease symptoms of SOB with activities of daily living.    Expected Outcomes Short Term: Improve cardiorespiratory fitness to achieve a reduction of symptoms when performing ADLs;Long Term: Be able to perform more ADLs without symptoms or delay the onset of symptoms          Core Components/Risk Factors/Patient Goals Review:    Core Components/Risk Factors/Patient Goals at Discharge (Final Review):    ITP Comments: Dr. Slater Staff is Medical Director for Pulmonary Rehab at [facility].

## 2023-11-22 ENCOUNTER — Encounter (HOSPITAL_COMMUNITY)

## 2023-11-22 ENCOUNTER — Ambulatory Visit (HOSPITAL_COMMUNITY)

## 2023-11-23 NOTE — Addendum Note (Signed)
 Encounter addended by: Cyrus Hadassah ORN, RN on: 11/23/2023 8:32 AM  Actions taken: Clinical Note Signed

## 2023-11-24 ENCOUNTER — Ambulatory Visit (HOSPITAL_COMMUNITY)

## 2023-11-24 ENCOUNTER — Encounter (HOSPITAL_COMMUNITY)

## 2023-11-27 ENCOUNTER — Other Ambulatory Visit (HOSPITAL_COMMUNITY): Payer: Self-pay

## 2023-11-27 ENCOUNTER — Encounter (HOSPITAL_COMMUNITY)

## 2023-11-28 ENCOUNTER — Encounter (HOSPITAL_COMMUNITY)
Admission: RE | Admit: 2023-11-28 | Discharge: 2023-11-28 | Disposition: A | Source: Ambulatory Visit | Attending: Pulmonary Disease

## 2023-11-28 DIAGNOSIS — J84111 Idiopathic interstitial pneumonia, not otherwise specified: Secondary | ICD-10-CM | POA: Diagnosis not present

## 2023-11-28 LAB — GLUCOSE, CAPILLARY
Glucose-Capillary: 207 mg/dL — ABNORMAL HIGH (ref 70–99)
Glucose-Capillary: 139 mg/dL — ABNORMAL HIGH (ref 70–99)

## 2023-11-28 NOTE — Progress Notes (Signed)
 Daily Session Note  Patient Details  Name: William [name] MRN: [id number] Date of Birth: Jan 01, 1951 Referring Provider:   Conrad [name] Pulmonary Rehab Walk Test from 11/20/2023 in [facility] for Heart, Vascular, & Lung Health  Referring Provider Dr. Almond [name]    Encounter Date: 11/28/2023  Check In:  Session Check In - 11/28/23 1203       Check-In   Supervising physician immediately available to respond to emergencies [facility] MD immediately available    Physician(s) William Mose, NP    Location [facility]    Staff Present William Levin, RN, BSN;William [name] William [name], William Gal, MS, ACSM-CEP, Exercise Physiologist;William [name] BS, ACSM-CEP, Exercise Physiologist    Virtual Visit No    Medication changes reported     No    Fall or balance concerns reported    No    Tobacco Cessation No Change    Warm-up and Cool-down Performed as group-led instruction    Resistance Training Performed Yes    VAD Patient? No    PAD/SET Patient? No      Pain Assessment   Currently in Pain? No/denies    Multiple Pain Sites No          Capillary Blood Glucose: Results for orders placed or performed during the hospital encounter of 11/28/23 (from the past 24 hours)  Glucose, capillary     Status: Abnormal   Collection Time: 11/28/23 10:15 AM  Result Value Ref Range   Glucose-Capillary 207 (H) 70 - 99 mg/dL  Glucose, capillary     Status: Abnormal   Collection Time: 11/28/23 11:37 AM  Result Value Ref Range   Glucose-Capillary 139 (H) 70 - 99 mg/dL   *Note: Due to a large number of results and/or encounters for the requested time period, some results have not been displayed. A complete set of results can be found in Results Review.      Social History   Tobacco Use  Smoking Status Former   Current packs/day: 0.00   Average packs/day: 0.5 packs/day for 17.0 years (8.5 ttl pk-yrs)   Types: Cigarettes   Start date: 62   Quit date: 31    Years since quitting: 42.8  Smokeless Tobacco Never  Tobacco Comments   QUIT I 1983    Goals Met:  Proper associated with RPD/PD & O2 Sat Independence with exercise equipment Exercise tolerated well Strength training completed today William [name] had to use NRB mask with 8L HFNC while doing the warmup and cool down exercises to keep sats >88%. He used NRB mask alone while exercising on the nustep.  Goals Unmet:  Not Applicable  Comments: Service time is from 1010 to 1132.    Dr. Slater Staff is Medical Director for Pulmonary Rehab at [facility].

## 2023-11-29 ENCOUNTER — Encounter (HOSPITAL_COMMUNITY)

## 2023-11-30 ENCOUNTER — Other Ambulatory Visit: Payer: Self-pay

## 2023-11-30 ENCOUNTER — Emergency Department (HOSPITAL_BASED_OUTPATIENT_CLINIC_OR_DEPARTMENT_OTHER)

## 2023-11-30 ENCOUNTER — Emergency Department (HOSPITAL_BASED_OUTPATIENT_CLINIC_OR_DEPARTMENT_OTHER)
Admission: EM | Admit: 2023-11-30 | Discharge: 2023-12-01 | Disposition: A | Discharge status: Other Acute Inpatient Hospital | Source: Ambulatory Visit | Attending: Emergency Medicine | Admitting: Emergency Medicine

## 2023-11-30 ENCOUNTER — Encounter (HOSPITAL_COMMUNITY)
Admission: RE | Admit: 2023-11-30 | Discharge: 2023-11-30 | Disposition: A | Discharge status: Home / Self Care | Source: Ambulatory Visit | Attending: Pulmonary Disease | Admitting: Pulmonary Disease

## 2023-11-30 VITALS — Wt 211.6 lb

## 2023-11-30 DIAGNOSIS — Z7982 Long term (current) use of aspirin: Secondary | ICD-10-CM | POA: Diagnosis not present

## 2023-11-30 DIAGNOSIS — I110 Hypertensive heart disease with heart failure: Secondary | ICD-10-CM | POA: Insufficient documentation

## 2023-11-30 DIAGNOSIS — I2510 Atherosclerotic heart disease of native coronary artery without angina pectoris: Secondary | ICD-10-CM | POA: Insufficient documentation

## 2023-11-30 DIAGNOSIS — J8410 Pulmonary fibrosis, unspecified: Secondary | ICD-10-CM | POA: Diagnosis not present

## 2023-11-30 DIAGNOSIS — R918 Other nonspecific abnormal finding of lung field: Secondary | ICD-10-CM | POA: Diagnosis not present

## 2023-11-30 DIAGNOSIS — Z9981 Dependence on supplemental oxygen: Secondary | ICD-10-CM | POA: Diagnosis not present

## 2023-11-30 DIAGNOSIS — Z944 Liver transplant status: Secondary | ICD-10-CM | POA: Diagnosis not present

## 2023-11-30 DIAGNOSIS — Z92241 Personal history of systemic steroid therapy: Secondary | ICD-10-CM | POA: Insufficient documentation

## 2023-11-30 DIAGNOSIS — J9601 Acute respiratory failure with hypoxia: Secondary | ICD-10-CM | POA: Diagnosis not present

## 2023-11-30 DIAGNOSIS — R0602 Shortness of breath: Secondary | ICD-10-CM | POA: Diagnosis not present

## 2023-11-30 DIAGNOSIS — I509 Heart failure, unspecified: Secondary | ICD-10-CM | POA: Diagnosis not present

## 2023-11-30 DIAGNOSIS — R778 Other specified abnormalities of plasma proteins: Secondary | ICD-10-CM | POA: Diagnosis not present

## 2023-11-30 DIAGNOSIS — R059 Cough, unspecified: Secondary | ICD-10-CM | POA: Diagnosis present

## 2023-11-30 DIAGNOSIS — R600 Localized edema: Secondary | ICD-10-CM | POA: Insufficient documentation

## 2023-11-30 DIAGNOSIS — Z79899 Other long term (current) drug therapy: Secondary | ICD-10-CM | POA: Diagnosis not present

## 2023-11-30 DIAGNOSIS — R7989 Other specified abnormal findings of blood chemistry: Secondary | ICD-10-CM

## 2023-11-30 DIAGNOSIS — J84111 Idiopathic interstitial pneumonia, not otherwise specified: Secondary | ICD-10-CM

## 2023-11-30 LAB — CBC
HCT: 44.9 % (ref 39.0–52.0)
Hemoglobin: 14.5 g/dL (ref 13.0–17.0)
MCH: 27.6 pg (ref 26.0–34.0)
MCHC: 32.3 g/dL (ref 30.0–36.0)
MCV: 85.5 fL (ref 80.0–100.0)
Platelets: 234 K/uL (ref 150–400)
RBC: 5.25 MIL/uL (ref 4.22–5.81)
RDW: 16.4 % — ABNORMAL HIGH (ref 11.5–15.5)
WBC: 14 K/uL — ABNORMAL HIGH (ref 4.0–10.5)
nRBC: 0 % (ref 0.0–0.2)

## 2023-11-30 LAB — RESP PANEL BY RT-PCR (RSV, FLU A&B, COVID)  RVPGX2
Influenza A by PCR: NEGATIVE
Influenza B by PCR: NEGATIVE
Resp Syncytial Virus by PCR: NEGATIVE
SARS Coronavirus 2 by RT PCR: NEGATIVE

## 2023-11-30 LAB — COMPREHENSIVE METABOLIC PANEL WITH GFR
ALT: 12 U/L (ref 0–44)
AST: 18 U/L (ref 15–41)
Albumin: 4.7 g/dL (ref 3.5–5.0)
Alkaline Phosphatase: 96 U/L (ref 38–126)
Anion gap: 11 (ref 5–15)
BUN: 18 mg/dL (ref 8–23)
CO2: 27 mmol/L (ref 22–32)
Calcium: 10.1 mg/dL (ref 8.9–10.3)
Chloride: 98 mmol/L (ref 98–111)
Creatinine, Ser: 1.48 mg/dL — ABNORMAL HIGH (ref 0.61–1.24)
GFR, Estimated: 49 mL/min — ABNORMAL LOW (ref >60)
Glucose, Bld: 116 mg/dL — ABNORMAL HIGH (ref 70–99)
Potassium: 4.5 mmol/L (ref 3.5–5.1)
Sodium: 135 mmol/L (ref 135–145)
Total Bilirubin: 1.3 mg/dL — ABNORMAL HIGH (ref 0.0–1.2)
Total Protein: 7.5 g/dL (ref 6.5–8.1)

## 2023-11-30 LAB — TROPONIN T, HIGH SENSITIVITY
Troponin T High Sensitivity: 32 ng/L — ABNORMAL HIGH (ref 0–19)
Troponin T High Sensitivity: 55 ng/L — ABNORMAL HIGH (ref 0–19)

## 2023-11-30 LAB — GLUCOSE, CAPILLARY
Glucose-Capillary: 145 mg/dL — ABNORMAL HIGH (ref 70–99)
Glucose-Capillary: 146 mg/dL — ABNORMAL HIGH (ref 70–99)

## 2023-11-30 LAB — PRO BRAIN NATRIURETIC PEPTIDE: Pro Brain Natriuretic Peptide: 1401 pg/mL — ABNORMAL HIGH (ref <300.0)

## 2023-11-30 LAB — PROCALCITONIN: Procalcitonin: <0.1 ng/mL

## 2023-11-30 MED ORDER — METHYLPREDNISOLONE SODIUM SUCC 125 MG IJ SOLR
125.0000 mg | Freq: Once | INTRAMUSCULAR | Status: AC
  Administered 2023-11-30: 125 mg via INTRAVENOUS
  Filled 2023-11-30: qty 2

## 2023-11-30 MED ORDER — FUROSEMIDE 10 MG/ML IJ SOLN
40.0000 mg | Freq: Once | INTRAMUSCULAR | Status: AC
  Administered 2023-11-30: 40 mg via INTRAVENOUS
  Filled 2023-11-30: qty 4

## 2023-11-30 NOTE — ED Notes (Signed)
 He remains on the [place] O2 device and is comfortably visiting with his wife.

## 2023-11-30 NOTE — ED Triage Notes (Signed)
 Pt via pov from Pulmonary rehab with sob; increased need for oxygen . Pt was using 2-4L at home in September, now uses 5 at rest, 8-15L with any exertion. Pt was put on 30L at rehab.  Pt on 15L during triage, at 91%.   Pt diaphoretic and speaking in short sentences.

## 2023-11-30 NOTE — Progress Notes (Signed)
 Titrated oxygen  from HFNC 15L to HFNC to 10L . Current SPO2 is 98%. RT to continue to monitor.

## 2023-11-30 NOTE — Progress Notes (Signed)
 Daily Session Note  Patient Details  Name: William [name] MRN: [id number] Date of Birth: 1950-06-28 Referring Provider:   Conrad Ports Pulmonary Rehab Walk Test from 11/20/2023 in [facility] for Heart, Vascular, & Lung [facility]  Referring Provider Dr. Almond Cone    Encounter Date: 11/30/2023  Check In:  Session Check In - 11/30/23 1203       Check-In   Supervising physician immediately available to respond to emergencies [facility] MD immediately available    Physician(s) Damien Braver, NP    Location [facility]    Staff Present Ronal Levin, RN, BSN;Floreen Teegarden Claudene, RT;Randi [facility] BS, ACSM-CEP, Exercise Physiologist;Kaylee Nicholaus, MS, ACSM-CEP, Exercise Physiologist    Virtual Visit No    Medication changes reported     No    Fall or balance concerns reported    No    Tobacco Cessation No Change    Warm-up and Cool-down Performed as group-led instruction    Resistance Training Performed Yes    VAD Patient? No    PAD/SET Patient? No      Pain Assessment   Currently in Pain? No/denies    Pain Score 0-No pain    Multiple Pain Sites No          Capillary Blood Glucose: Results for orders placed or performed during the hospital encounter of 11/30/23 (from the past 24 hours)  Glucose, capillary     Status: Abnormal   Collection Time: 11/30/23 10:29 AM  Result Value Ref Range   Glucose-Capillary 145 (H) 70 - 99 mg/dL  Glucose, capillary     Status: Abnormal   Collection Time: 11/30/23 11:43 AM  Result Value Ref Range   Glucose-Capillary 146 (H) 70 - 99 mg/dL   *Note: Due to a large number of results and/or encounters for the requested time period, some results have not been displayed. A complete set of results can be found in Results Review.      Social History   Tobacco Use  Smoking Status Former   Current packs/day: 0.00   Average packs/day: 0.5 packs/day for 17.0 years (8.5 ttl pk-yrs)   Types: Cigarettes   Start date: 46    Quit date: 4   Years since quitting: 42.8  Smokeless Tobacco Never  Tobacco Comments   QUIT I 1983    Goals Met:  Proper associated with RPD/PD & O2 Sat Independence with exercise equipment Exercise tolerated well No report of concerns or symptoms today Strength training completed today  Goals Unmet:  Not Applicable  Comments: Service time is from 1023 to 1139.    Dr. Slater Staff is Medical Director for Pulmonary Rehab at [facility].

## 2023-11-30 NOTE — Progress Notes (Signed)
 Pulmonary Rehab Session Note  Patient Details  Name: William [name] MRN: [id number] Date of Birth: 1950-07-06 Referring Provider:   Conrad Ports Pulmonary Rehab Walk Test from 11/20/2023 in [facility] for Heart, Vascular, & Lung Health  Referring Provider Dr. Almond Cone    Concerned about pt's increasing oxygen  needs in class today. Pt needed 15L NRB AND 15L HFNC. Sats still dropped to 86% while walking to/from car and to/from exercise equipment. Pt denies any new or worsening SOB. Pt states, at times, O2 sats in the 60s. RN reached out to pt's pulmonologist, Dr. Cone at [facility]. Advised to send pt to ED. Pt called, spoke to his wife since pt was driving, advised to go to ED for increased oxygen  needs.   Pt needed 25L NRB on on 10/13, [facility] walk test on 9/24 pt required 15L NRB for .

## 2023-11-30 NOTE — ED Notes (Signed)
 Spoke with Leita at [facility] have re-paged pulmonologist 17:18

## 2023-11-30 NOTE — ED Notes (Signed)
 Spoke with Marolyn at [employer] [facility] [phone]. Transferred call to provider 16:11

## 2023-11-30 NOTE — ED Notes (Signed)
 [facility] has been called to transport pt to [facility]. Number for report [phone]. Face sheet faxed. No ETA given.

## 2023-11-30 NOTE — ED Provider Notes (Signed)
 [place] EMERGENCY DEPARTMENT AT [facility] Provider Note   CSN: [id number] Arrival date & time: 11/30/23  1344    Patient presents with: Shortness of Breath   William [name] is a 73 y.o. male history of liver transplant- 2021, AAA, hypertension, CAD, CHF, interstitial lung disease/pulmonary fibrosis with chronic hypoxic respiratory failure here for evaluation of worsening hypoxia.  Patient states at baseline he is on 5 L via nasal cannula, up and moving around he is 15 L via nasal cannula.  Wife states he had admission at [facility] in July for worsening hypoxia apparently did high-dose steroids, came off September 1.  She thinks his shortness of breath has been worsening since however today he went to pulmonary rehab, elevator was broken, attempted to walk up a few steps and was unable to due to shortness of breath.  Rehab team came downstairs to grab patient from the car when he went upstairs they noted him needing 25 L nonrebreather to get oxygen  greater than 88%.  Patient has noticed over the last few weeks when he gets up and walks around his oxygen  will drop into the 60s.  Does feel like he has a burning sensation he cannot catch his breath when he walks.  He states if he is sitting there he feels fine.  He denies any back pain, numbness, weakness.  He has a chronic cough which is unchanged.  No hemoptysis.  No recent illnesses, fever.  Does not use CPAP at night, does wear 5 L, wife states he is rolling over in bed his oxygen  monitor would alarm.  Not currently on any steroids.  Does not use any albuterol , nebulizers at home he denies any new pain or swelling to his legs.  Wife states his oxygen  has been gradually worsening over the last month.  He denies any prior history of PE, DVT, recent surgery, immobilization or malignancy. 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 Jesus with [facility]  Patient states this feels similar to his prior admission over the summer for worsening  hypoxia.     HPI     Prior to Admission medications   Medication Sig Start Date End Date Taking? Authorizing Provider  acetaminophen  (TYLENOL ) 500 MG tablet Take 1,000 mg by mouth as needed.   Yes [provider]  amLODipine  (NORVASC ) 10 MG tablet Take 1 tablet (10 mg total) by mouth daily. 01/26/23  Yes McGowen, Aleene DEL, MD  ammonium lactate  (AMLACTIN) 12 % cream Apply 2 times daily to forearms and hands in preparation for treatment with Efudex  (fluorouracil ) Patient taking differently: Apply 1 Application topically as needed for dry skin. 02/10/22  Yes   amoxicillin  (AMOXIL ) 500 MG capsule Prophylaxis for dental procedure. Take 2 grams (4x 500mg  tablets) 1 hour before procedure. 11/07/23  Yes Wonda Sharper, MD  aspirin EC 81 MG tablet Take 81 mg by mouth daily. Swallow whole.   Yes [provider]  furosemide  (LASIX ) 20 MG tablet Take 1 tablet (20 mg total) by mouth daily. 01/26/23  Yes McGowen, Aleene DEL, MD  metFORMIN  (GLUCOPHAGE ) 500 MG tablet Take 1 tablet (500 mg total) by mouth 2 (two) times daily with a meal. 07/24/23  Yes McGowen, Aleene DEL, MD  metoprolol  tartrate (LOPRESSOR ) 100 MG tablet Take 1 tablet (100 mg total) by mouth 2 (two) times daily. 11/20/23  Yes McGowen, Aleene DEL, MD  mycophenolate  (CELLCEPT ) 250 MG capsule Take 2 capsules by mouth in the morning and 1 capsule in the  evening. 03/03/23  Yes   pantoprazole  (PROTONIX ) 40 MG tablet Take 1 tablet (40 mg total) by mouth daily. 11/21/22  Yes   rosuvastatin  (CRESTOR ) 10 MG tablet Take 1 tablet (10 mg total) by mouth daily. 08/28/23 08/22/24 Yes Weaver, Scott T, PA-C  tacrolimus  (PROGRAF ) 1 MG capsule Take 2 capsules (2 mg total) by mouth every morning AND 1 capsule (1 mg total) every evening. 08/31/23  Yes   acitretin  (SORIATANE ) 10 MG capsule Take 1 capsule by mouth daily Patient not taking: Reported on 10/05/2023 04/19/23     triamcinolone  cream (KENALOG ) 0.1 % Apply 2 times a day to rash. Follow with CeraVe  Cream.  Stop when smooth. Patient not taking: Reported on 10/05/2023 11/28/22       Allergies: Niacinamide, Thiazide-type diuretics, and Quinolones    Review of Systems  Constitutional: Negative.   HENT: Negative.    Respiratory:  Positive for cough (chronic), chest tightness and shortness of breath (chronic).   Cardiovascular: Negative.   Gastrointestinal: Negative.   Genitourinary: Negative.   Musculoskeletal: Negative.   Skin: Negative.   Neurological: Negative.   All other systems reviewed and are negative.   Updated Vital Signs BP 120/75   Pulse 67   Temp 97.7 F (36.5 C) (Oral)   Resp 13   Ht 5' 9 (1.753 m)   Wt 96.8 kg   SpO2 96%   BMI 31.51 kg/m   Physical Exam Vitals and nursing note reviewed.  Constitutional:      General: He is not in acute distress.    Appearance: He is well-developed. He is ill-appearing (chronically ill appearing). He is not toxic-appearing or diaphoretic.  HENT:     Head: Normocephalic and atraumatic.  Eyes:     Pupils: Pupils are equal, round, and reactive to light.  Cardiovascular:     Rate and Rhythm: Normal rate and regular rhythm.     Pulses: Normal pulses.          Radial pulses are 2+ on the right side and 2+ on the left side.       Dorsalis pedis pulses are 2+ on the right side and 2+ on the left side.     Heart sounds: Normal heart sounds.  Pulmonary:     Effort: Pulmonary effort is normal. No respiratory distress.     Breath sounds: Decreased breath sounds present.     Comments: Decreased breath sounds BIL. No wheeze. Speaks in full sentences without difficulty. On 15 L HFNC Chest:     Chest wall: No mass, tenderness or edema.  Abdominal:     General: There is no distension.     Palpations: Abdomen is soft.  Musculoskeletal:        General: Normal range of motion.     Cervical back: Normal range of motion and neck supple.     Right lower leg: No tenderness. Edema present.     Left lower leg: No tenderness. Edema  present.     Comments: 1+ pitting edema to BLE  Skin:    General: Skin is warm and dry.     Capillary Refill: Capillary refill takes less than 2 seconds.  Neurological:     General: No focal deficit present.     Mental Status: He is alert and oriented to person, place, and time.     (all labs ordered are listed, but only abnormal results are displayed) Labs Reviewed  CBC - Abnormal; Notable for the following components:  Result Value   WBC 14.0 (*)    RDW 16.4 (*)    All other components within normal limits  PRO BRAIN NATRIURETIC PEPTIDE - Abnormal; Notable for the following components:   Pro Brain Natriuretic Peptide 1,401.0 (*)    All other components within normal limits  COMPREHENSIVE METABOLIC PANEL WITH GFR - Abnormal; Notable for the following components:   Glucose, Bld 116 (*)    Creatinine, Ser 1.48 (*)    Total Bilirubin 1.3 (*)    GFR, Estimated 49 (*)    All other components within normal limits  TROPONIN T, HIGH SENSITIVITY - Abnormal; Notable for the following components:   Troponin T High Sensitivity 32 (*)    All other components within normal limits  TROPONIN T, HIGH SENSITIVITY - Abnormal; Notable for the following components:   Troponin T High Sensitivity 55 (*)    All other components within normal limits  RESP PANEL BY RT-PCR (RSV, FLU A&B, COVID)  [id number]  PROCALCITONIN    EKG: EKG Interpretation Date/Time:  Thursday November 30 2023 13:59:00 EDT Ventricular Rate:  67 PR Interval:  225 QRS Duration:  153 QT Interval:  455 QTC Calculation: 481 R Axis:   62  Text Interpretation: Sinus rhythm Prolonged PR interval Nonspecific intraventricular conduction delay Probable anteroseptal infarct, recent No significant change since last tracing Confirmed by Dean Clarity [phone]) on 11/30/2023 2:50:37 PM  Radiology: [facility] Chest Port 1 View Result Date: 11/30/2023 CLINICAL DATA:  Shortness of breath. EXAM: PORTABLE CHEST 1 VIEW COMPARISON:  Chest x-ray  03/26/2018, CT 03/26/2018 FINDINGS: Sternotomy wires unchanged. Lungs are somewhat hypoinflated with chronic bilateral interstitial changes compatible with known pulmonary fibrosis. Possible increased mixed interstitial airspace density over the left base as cannot exclude a superimposed acute process such as infection. No effusion. Cardiomediastinal silhouette and remainder the exam is unchanged. IMPRESSION: Chronic bilateral interstitial changes compatible with known pulmonary fibrosis. Possible increased mixed interstitial and airspace density over the left base as cannot exclude a superimposed acute process such as infection. Electronically Signed   By: Toribio Agreste M.D.   On: 11/30/2023 15:07     .Critical Care  Performed by: Edie Rosebud LABOR, PA-C Authorized by: Edie Rosebud LABOR, PA-C   Critical care provider statement:    Critical care time (minutes):  35   Critical care was necessary to treat or prevent imminent or life-threatening deterioration of the following conditions:  Respiratory failure   Critical care was time spent personally by me on the following activities:  Development of treatment plan with patient or surrogate, discussions with consultants, evaluation of patient's response to treatment, examination of patient, ordering and review of laboratory studies, ordering and review of radiographic studies, ordering and performing treatments and interventions, pulse oximetry, re-evaluation of patient's condition and review of old charts    Medications Ordered in the ED  methylPREDNISolone  sodium succinate (SOLU-MEDROL ) 125 mg/2 mL injection 125 mg (125 mg Intravenous Given 11/30/23 1454)  furosemide  (LASIX ) injection 40 mg (40 mg Intravenous Given 11/30/23 3631)    73 year old with multiple medical comorbidities including prior liver transplant, interstitial lung disease with chronic hypoxic respiratory failure, CAD, CHF here for evaluation of worsening hypoxia.  5 L [place] sitting,  15 L [place] while ambulating.  Today while at pulmonary rehab was requiring 25 L nonrebreather.  Apparently nursing staff called his pulmonologist at [facility] who recommended ED evaluation.  Patient does note he has had hypoxia with movement and oxygen  down to the 60s at home over  the last few weeks.  Does have some sensation of chest burning when his oxygen  is low however denies any for chest pain, back pain, abdominal pain.  He denies any numbness or weakness.  Does have some edema to her lower extremities.  Here he is requiring 15 L high flow nasal cannula maintain oxygen  saturation 91%.  Will plan on labs, imaging, reassess  Labs and imaging personally viewed and interpreted:  CBC leukocytosis CMP 1.48, T. bili 1.3 BNP 1401 Trop 32---55 Chest xray pulmonary fibrosis, opacity left lung base cannot rule out infection EKG without ischemic changes, similar to prior  Patient reassesed.  Appears comfortable in room.  He is on 10 L high flow nasal cannula, does require 20 L HFNC for any movement. Will discuss with [facility] for admission.  His BNP is slightly up.  He does have a remote history of CHF.  He was given dose of steroids here.  Chest x-ray shows cannot rule out infection, will add on procalcitonin.  He denies any worsening cough, fever.  Suspect his symptoms likely multifactorial.    [facility] admission 08/2023 dc>> 3L of O2 at rest, 8-10L with household distance exertion, and 15L with longer distance and showering   Pulm rehab 11/28/23 8 L HFNC while doing steps  Pulm rehab 11/20/23 15L NRB for extended walks Clinical Course as of 11/30/23 2335  Thu Nov 30, 2023  1617 Alex with [facility] transfer line. Will discuss with team, currently at Capacity [BH]  1732 Leita at [facility], re-paging Pulmonology [BH]  8197 Patient reassessed.  Sat up in bed to use the urinal, became hypoxic to 80% on his 10 L high flow nasal cannula.  Bumped up to 12 HRNC with improvement to 95% [BH]  1826 Discussed with Marolyn Hails  transfer line.  Discussed with Dr. Rankin Barrios who is accepting.  No bed available currently, check back after midnight to see if bed available. [BH]  2308 Patient has bed available at [facility]. [BH]    Clinical Course User Index [BH] Navarre Diana A, PA-C   I suspect patient's increase in his oxygen  requirement likely multifactorial.  He was given Lasix  here.  I have low suspicion for PE, dissection, bacterial infectious process.  He is on 11 L HFNC, he appears quite comfortable in room.  He denies any overt chest pain or shortness of breath.  Patient admitted to [facility] to see his pulmonologist.  Willing to transfer patient.  Patient and family made aware.   Discussed with attending, Dr. Dean who personally saw and evaluated patient, agreeable with transfer for admission to [facility] for worsening hypoxic respiratory failure                                 Medical Decision Making Amount and/or Complexity of Data Reviewed Independent Historian: spouse External Data Reviewed: labs, radiology, ECG and notes. Labs: ordered. Decision-making details documented in ED Course. Radiology: ordered and independent interpretation performed. Decision-making details documented in ED Course. ECG/medicine tests: ordered and independent interpretation performed. Decision-making details documented in ED Course.  Risk OTC drugs. Prescription drug management. Decision regarding hospitalization. Diagnosis or treatment significantly limited by social determinants of health.        Final diagnoses:  Acute respiratory failure with hypoxia (HCC)  Pulmonary fibrosis (HCC)  Acute on chronic congestive heart failure, unspecified heart failure type (HCC)  Elevated troponin  Liver transplant status [facility])    ED Discharge  Orders     None          Carlitos Bottino A, PA-C 11/30/23 2335    Midge Golas, MD 12/01/23 [phone]

## 2023-11-30 NOTE — Progress Notes (Signed)
   11/20/23 1009  6 Minute Walk  Phase Initial  Distance 625 feet  Walk Time 6 minutes  # of Rest Breaks 2 (2:32, 5:40 increase O2 and NRB)  MPH 1.18  METS 1.08  RPE 17  Perceived Dyspnea  4  VO2 Peak 3.77  Symptoms No  Resting HR 67 bpm  Resting BP 112/62  Resting Oxygen  Saturation  94 %  Exercise Oxygen  Saturation  during 6 min walk 86 %  Max Ex. HR 77 bpm  Max Ex. BP 132/70  2 Minute Post BP 122/70  Interval HR  1 Minute HR 73  2 Minute HR 76  3 Minute HR 72  4 Minute HR 74  5 Minute HR 77  6 Minute HR 66  2 Minute Post HR 70  Interval Heart Rate? Yes  Interval Oxygen   Interval Oxygen ? Yes  Baseline Oxygen  Saturation % 94 %  1 Minute Oxygen  Saturation % 95 %  1 Minute Liters of Oxygen  15 L  2 Minute Oxygen  Saturation % 90 %  2 Minute Liters of Oxygen  15 L (NRB)  3 Minute Oxygen  Saturation % 93 %  3 Minute Liters of Oxygen  15 L (NRB)  4 Minute Oxygen  Saturation % 96 %  4 Minute Liters of Oxygen  15 L (NRB)  5 Minute Oxygen  Saturation % 90 % (86% at 5:40)  5 Minute Liters of Oxygen  15 L (increased to 25 NRB)  6 Minute Oxygen  Saturation % 95 %  6 Minute Liters of Oxygen  25 L (NRB)  2 Minute Post Oxygen  Saturation % 91 %  2 Minute Post Liters of Oxygen  10 L (NRB)

## 2023-11-30 NOTE — ED Notes (Signed)
 Per Rosebud Fass, PA, the accepting physician at [facility] is Dr. Rankin Barrios. Currently no beds available. [facility] bed placement requests that we call back to check status after midnight [phone] if they have not contacted us .

## 2023-12-01 ENCOUNTER — Encounter (HOSPITAL_COMMUNITY)

## 2023-12-01 DIAGNOSIS — I959 Hypotension, unspecified: Secondary | ICD-10-CM | POA: Diagnosis not present

## 2023-12-01 DIAGNOSIS — Z944 Liver transplant status: Secondary | ICD-10-CM | POA: Diagnosis not present

## 2023-12-01 DIAGNOSIS — J849 Interstitial pulmonary disease, unspecified: Secondary | ICD-10-CM | POA: Diagnosis not present

## 2023-12-01 DIAGNOSIS — J9621 Acute and chronic respiratory failure with hypoxia: Secondary | ICD-10-CM | POA: Diagnosis not present

## 2023-12-01 DIAGNOSIS — Z743 Need for continuous supervision: Secondary | ICD-10-CM | POA: Diagnosis not present

## 2023-12-01 DIAGNOSIS — J8410 Pulmonary fibrosis, unspecified: Secondary | ICD-10-CM | POA: Diagnosis not present

## 2023-12-01 DIAGNOSIS — R0902 Hypoxemia: Secondary | ICD-10-CM | POA: Diagnosis not present

## 2023-12-01 DIAGNOSIS — Z952 Presence of prosthetic heart valve: Secondary | ICD-10-CM | POA: Diagnosis not present

## 2023-12-01 DIAGNOSIS — R531 Weakness: Secondary | ICD-10-CM | POA: Diagnosis not present

## 2023-12-02 DIAGNOSIS — Z952 Presence of prosthetic heart valve: Secondary | ICD-10-CM | POA: Diagnosis not present

## 2023-12-02 DIAGNOSIS — I272 Pulmonary hypertension, unspecified: Secondary | ICD-10-CM | POA: Diagnosis not present

## 2023-12-02 DIAGNOSIS — J9621 Acute and chronic respiratory failure with hypoxia: Secondary | ICD-10-CM | POA: Diagnosis not present

## 2023-12-02 DIAGNOSIS — D849 Immunodeficiency, unspecified: Secondary | ICD-10-CM | POA: Diagnosis not present

## 2023-12-02 DIAGNOSIS — I10 Essential (primary) hypertension: Secondary | ICD-10-CM | POA: Diagnosis not present

## 2023-12-02 DIAGNOSIS — Z944 Liver transplant status: Secondary | ICD-10-CM | POA: Diagnosis not present

## 2023-12-02 DIAGNOSIS — J849 Interstitial pulmonary disease, unspecified: Secondary | ICD-10-CM | POA: Diagnosis not present

## 2023-12-02 DIAGNOSIS — N1831 Chronic kidney disease, stage 3a: Secondary | ICD-10-CM | POA: Diagnosis not present

## 2023-12-03 DIAGNOSIS — J8410 Pulmonary fibrosis, unspecified: Secondary | ICD-10-CM | POA: Diagnosis not present

## 2023-12-03 DIAGNOSIS — J9621 Acute and chronic respiratory failure with hypoxia: Secondary | ICD-10-CM | POA: Diagnosis not present

## 2023-12-03 DIAGNOSIS — J849 Interstitial pulmonary disease, unspecified: Secondary | ICD-10-CM | POA: Diagnosis not present

## 2023-12-03 DIAGNOSIS — Z944 Liver transplant status: Secondary | ICD-10-CM | POA: Diagnosis not present

## 2023-12-03 DIAGNOSIS — I10 Essential (primary) hypertension: Secondary | ICD-10-CM | POA: Diagnosis not present

## 2023-12-03 DIAGNOSIS — N1831 Chronic kidney disease, stage 3a: Secondary | ICD-10-CM | POA: Diagnosis not present

## 2023-12-04 ENCOUNTER — Encounter (HOSPITAL_COMMUNITY)

## 2023-12-04 DIAGNOSIS — J849 Interstitial pulmonary disease, unspecified: Secondary | ICD-10-CM | POA: Diagnosis not present

## 2023-12-04 DIAGNOSIS — J9621 Acute and chronic respiratory failure with hypoxia: Secondary | ICD-10-CM | POA: Diagnosis not present

## 2023-12-04 DIAGNOSIS — Z944 Liver transplant status: Secondary | ICD-10-CM | POA: Diagnosis not present

## 2023-12-04 DIAGNOSIS — N1831 Chronic kidney disease, stage 3a: Secondary | ICD-10-CM | POA: Diagnosis not present

## 2023-12-04 DIAGNOSIS — J8410 Pulmonary fibrosis, unspecified: Secondary | ICD-10-CM | POA: Diagnosis not present

## 2023-12-04 DIAGNOSIS — I10 Essential (primary) hypertension: Secondary | ICD-10-CM | POA: Diagnosis not present

## 2023-12-05 ENCOUNTER — Encounter (HOSPITAL_COMMUNITY)

## 2023-12-05 DIAGNOSIS — J8410 Pulmonary fibrosis, unspecified: Secondary | ICD-10-CM | POA: Diagnosis not present

## 2023-12-05 DIAGNOSIS — I10 Essential (primary) hypertension: Secondary | ICD-10-CM | POA: Diagnosis not present

## 2023-12-05 DIAGNOSIS — Z9981 Dependence on supplemental oxygen: Secondary | ICD-10-CM | POA: Diagnosis not present

## 2023-12-05 DIAGNOSIS — Z944 Liver transplant status: Secondary | ICD-10-CM | POA: Diagnosis not present

## 2023-12-05 DIAGNOSIS — Z954 Presence of other heart-valve replacement: Secondary | ICD-10-CM | POA: Diagnosis not present

## 2023-12-05 DIAGNOSIS — J9621 Acute and chronic respiratory failure with hypoxia: Secondary | ICD-10-CM | POA: Diagnosis not present

## 2023-12-05 DIAGNOSIS — J9611 Chronic respiratory failure with hypoxia: Secondary | ICD-10-CM | POA: Diagnosis not present

## 2023-12-05 DIAGNOSIS — I509 Heart failure, unspecified: Secondary | ICD-10-CM | POA: Diagnosis not present

## 2023-12-05 DIAGNOSIS — Z951 Presence of aortocoronary bypass graft: Secondary | ICD-10-CM | POA: Diagnosis not present

## 2023-12-05 DIAGNOSIS — E119 Type 2 diabetes mellitus without complications: Secondary | ICD-10-CM | POA: Diagnosis not present

## 2023-12-05 DIAGNOSIS — I2729 Other secondary pulmonary hypertension: Secondary | ICD-10-CM | POA: Diagnosis not present

## 2023-12-05 DIAGNOSIS — N1831 Chronic kidney disease, stage 3a: Secondary | ICD-10-CM | POA: Diagnosis not present

## 2023-12-05 DIAGNOSIS — J849 Interstitial pulmonary disease, unspecified: Secondary | ICD-10-CM | POA: Diagnosis not present

## 2023-12-06 ENCOUNTER — Encounter (HOSPITAL_COMMUNITY)

## 2023-12-06 DIAGNOSIS — I272 Pulmonary hypertension, unspecified: Secondary | ICD-10-CM | POA: Diagnosis not present

## 2023-12-06 DIAGNOSIS — Z952 Presence of prosthetic heart valve: Secondary | ICD-10-CM | POA: Diagnosis not present

## 2023-12-06 DIAGNOSIS — Z944 Liver transplant status: Secondary | ICD-10-CM | POA: Diagnosis not present

## 2023-12-06 DIAGNOSIS — J849 Interstitial pulmonary disease, unspecified: Secondary | ICD-10-CM | POA: Diagnosis not present

## 2023-12-06 DIAGNOSIS — J9621 Acute and chronic respiratory failure with hypoxia: Secondary | ICD-10-CM | POA: Diagnosis not present

## 2023-12-06 DIAGNOSIS — N1831 Chronic kidney disease, stage 3a: Secondary | ICD-10-CM | POA: Diagnosis not present

## 2023-12-06 DIAGNOSIS — J8410 Pulmonary fibrosis, unspecified: Secondary | ICD-10-CM | POA: Diagnosis not present

## 2023-12-06 DIAGNOSIS — I10 Essential (primary) hypertension: Secondary | ICD-10-CM | POA: Diagnosis not present

## 2023-12-06 NOTE — Progress Notes (Signed)
 Pulmonary Individual Treatment Plan  Patient Details  Name: William [name] MRN: [id number] Date of Birth: 02/09/1950 Referring Provider:   Conrad Ports Pulmonary Rehab Walk Test from 11/20/2023 in [facility]  Referring Provider Dr. Almond Cone    Initial Encounter Date:  Flowsheet Row Pulmonary Rehab Walk Test from 11/20/2023 in [facility]  Date 11/20/23    Visit Diagnosis: Idiopathic interstitial pneumonia, not otherwise specified [facility])  Patient's Home Medications on Admission:   Current Outpatient Medications:    acetaminophen  (TYLENOL ) 500 MG tablet, Take 1,000 mg by mouth as needed., Disp: , Rfl:    acitretin  (SORIATANE ) 10 MG capsule, Take 1 capsule by mouth daily (Patient not taking: Reported on 10/05/2023), Disp: 30 capsule, Rfl: 5   amLODipine  (NORVASC ) 10 MG tablet, Take 1 tablet (10 mg total) by mouth daily., Disp: 90 tablet, Rfl: 3   ammonium lactate  (AMLACTIN) 12 % cream, Apply 2 times daily to forearms and hands in preparation for treatment with Efudex  (fluorouracil ) (Patient taking differently: Apply 1 Application topically as needed for dry skin.), Disp: 385 g, Rfl: 99   amoxicillin  (AMOXIL ) 500 MG capsule, Prophylaxis for dental procedure. Take 2 grams (4x 500mg  tablets) 1 hour before procedure., Disp: 4 capsule, Rfl: 0   aspirin EC 81 MG tablet, Take 81 mg by mouth daily. Swallow whole., Disp: , Rfl:    furosemide  (LASIX ) 20 MG tablet, Take 1 tablet (20 mg total) by mouth daily., Disp: 90 tablet, Rfl: 3   metFORMIN  (GLUCOPHAGE ) 500 MG tablet, Take 1 tablet (500 mg total) by mouth 2 (two) times daily with a meal., Disp: 180 tablet, Rfl: 3   metoprolol  tartrate (LOPRESSOR ) 100 MG tablet, Take 1 tablet (100 mg total) by mouth 2 (two) times daily., Disp: 90 tablet, Rfl: 0   mycophenolate  (CELLCEPT ) 250 MG capsule, Take 2 capsules by mouth in the morning  and 1 capsule in the evening., Disp: 270 capsule, Rfl: 3   pantoprazole  (PROTONIX ) 40 MG tablet, Take 1 tablet (40 mg total) by mouth daily., Disp: 90 tablet, Rfl: 3   rosuvastatin  (CRESTOR ) 10 MG tablet, Take 1 tablet (10 mg total) by mouth daily., Disp: 90 tablet, Rfl: 3   tacrolimus  (PROGRAF ) 1 MG capsule, Take 2 capsules (2 mg total) by mouth every morning AND 1 capsule (1 mg total) every evening., Disp: 270 capsule, Rfl: 3   triamcinolone  cream (KENALOG ) 0.1 %, Apply 2 times a day to rash. Follow with CeraVe Cream.  Stop when smooth. (Patient not taking: Reported on 10/05/2023), Disp: 80 g, Rfl: 2 No current facility-administered medications for this encounter.  Facility-Administered Medications Ordered in Other Encounters:    heparin  lock flush 100 unit/mL, 500 Units, Intracatheter, Daily PRN, Lanny Callander, MD   sodium chloride  flush (NS) 0.9 % injection 10 mL, 10 mL, Intracatheter, PRN, Lanny Callander, MD  Past Medical History: Past Medical History:  Diagnosis Date   AAA (abdominal aortic aneurysm) 03/2014   repaired 2020   Ascending aortic aneurysm    unrepaired. Stable at 4.3 cm on imaging by [facility] thoracic surg 10/2019 and 10/2020.   Bilateral renal cysts    simple (03/2014 MRI)   CAD (coronary artery disease)    Cholelithiases 2018   asymptomatic   Chronic diastolic heart failure (HCC)    Chronic renal insufficiency, stage 3 (moderate) 2022   baseline sCr 1.3-1.5 (GFR 55)   COVID-19 virus infection  02/2020   sotrovimab  infusion   Diabetes mellitus with complication (HCC) 07/2014   A1c 6.8%. A1c 5.4% 11/2019   Diverticulosis    Gout    initially dx'd by diag arthrocentesis of elbow effusion in winter 2021, 3 episodes, all came after being started on chlorthalidone ->I d/c'd chlorthal.   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  Improving + off oxygen  as of 3 mo s/p liver transplant.  Doing well/resolved as of 02/2020 [facility] pulm f/u->they'll continue to follow. No worsening on CT  imaging [facility] 03/2023   History of blood transfusion 2018 X 4 dates   Chronic GI blood loss of unknown location despite full GI endoscopic eval, etc   History of liver transplant (HCC) 07/2019   [facility]   Hyperkalemia    Lokelma    Hyperlipemia, mixed    elevated LFTs when on statins.     Hypertension    Cr bump 04/01/16 so I changed benicar -hct to benicar  plain and added amlodipine  5 mg.   Increased prostate specific antigen (PSA) velocity 2021   0.11 to 4.77 from 2020 to 2021 (he got a liver transplant and was put on antirejection meds between these psa checks).   Iron  deficiency anemia 05/2016   Acute blood loss anemia: hospitalized, required transfusion x 3 U: colonoscopy and capsule study unrevealing.  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  W/u for ? hemolytic anemia to be pursued by hematologist as outpt.  Dr. Charlean, GI at [facility] following, too---he rec'd onc do bone marrow bx as of Jan 2019   Iron  deficiency anemia due to chronic blood loss 2018/19   GI: transfusions x >20 required; multiple endoscopies and bleeding scans unrevealing. Lysteda  and octreotide  + monthly iron  infusions as of 04/2017. Iron  infusions changed to every other month as of 09/2018 hem f/u.   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  Changes noted on all CXRs in 2018/2019.  Liver Transplant eval 03/2018->mild restriction on PFTs but no obstruction.  See further details in [facility] pulm fibrosis section   Microscopic hematuria    Eval unremarkable by Dr. Maryanne.   Nonmelanoma skin cancer 08/15/2018   2020 BCC nose, excised.   2022 R side of neck->mohs   Obesity    Pulmonary fibrosis (HCC)    PFTs: restrictive lung dz ([facility] Liver transplant eval 05/2018). [facility] pulm eval felt this was likely hepatopulmonary syndrome->causing his hypoxia->low likelihood of progression (as of 10/04/18 transplant clinic f/u). Pulm rehab helping as of 2020/2021. OFF oxygen  as of 10/2019 transp f/u.   Pulmonary fibrosis (HCC)    Gradual worsening of objective testing 2020 05/2023, has had return of oxygen  requirement, diffuse parenchymal lung disease of unclear etiology.   Spleen enlarged     Tobacco Use: Social History   Tobacco Use  Smoking Status Former   Current packs/day: 0.00   Average packs/day: 0.5 packs/day for 17.0 years (8.5 ttl pk-yrs)   Types: Cigarettes   Start date: 73   Quit date: 21   Years since quitting: 42.8  Smokeless Tobacco Never  Tobacco Comments  QUIT I 1983    Labs: Review Flowsheet  More data exists      Latest Ref Rng & Units 01/13/2022 04/20/2022 07/21/2022 10/24/2022 01/26/2023  Labs for ITP Cardiac and Pulmonary Rehab  Cholestrol 0 - 200 mg/dL 885  - 94  - 90   LDL (calc) 0 - 99 mg/dL 53  - 41  - 40   HDL-C >39.00 mg/dL 74.39  - 74.09  - 75.29   Trlycerides 0.0 - 149.0 mg/dL 820.9  - 863.9  - 871.9   Hemoglobin A1c 5.7 - 6.4 % 0.0 - 7.0 % 4.0 - 5.6 % 4.0 - 5.6 % 6.3  6.3  6.3  6.3  7.8  7.8  7.8  7.8  5.5  5.5  5.5  5.5  5.7  5.7  5.7  5.7  5.6  5.6  5.6  5.6     Details       Multiple values from one day are sorted in reverse-chronological order         Capillary Blood Glucose: Lab Results  Component Value Date   GLUCAP 146 (H) 11/30/2023   GLUCAP 145 (H) 11/30/2023   GLUCAP 139 (H) 11/28/2023   GLUCAP 207 (H) 11/28/2023   GLUCAP 91 10/18/2023     Pulmonary Assessment Scores:  Pulmonary Assessment Scores     Row Name 11/20/23 0959         ADL UCSD   ADL Phase Entry     SOB Score total 88       CAT Score   CAT Score 18       mMRC Score   mMRC Score 4        UCSD: Self-administered rating of dyspnea associated with activities of daily living (ADLs) 6-point scale (0 = not at all to 5 = maximal or unable to do because of breathlessness)  Scoring Scores range from 0 to 120.  Minimally important difference is 5 units  CAT: CAT can identify the health impairment of COPD patients and is better correlated with disease progression.  CAT has a scoring range of zero to 40. The CAT score is classified into four groups of low (less than 10), medium (10 - 20), high (21-30) and very high (31-40) based on the impact level of disease on health status. A CAT score over 10 suggests significant symptoms.  A worsening CAT score could be explained by an exacerbation, poor medication adherence, poor inhaler technique, or progression of COPD or comorbid conditions.  CAT MCID is 2 points  mMRC: mMRC (Modified Medical Research Council) Dyspnea Scale is used to assess the degree of baseline functional disability in patients of respiratory disease due to dyspnea. No minimal important difference is established. A decrease in score of 1 point or greater is considered a positive change.   Pulmonary Function Assessment:  Pulmonary Function Assessment - 11/20/23 0921       Breath   Bilateral Breath Sounds Clear    Shortness of Breath Yes;Limiting activity          Exercise Target Goals: Exercise Program Goal: Individual exercise prescription set using results from initial 6 min walk test and THRR while considering  patient's activity barriers and safety.   Exercise Prescription Goal: Initial exercise prescription builds to 30-45 minutes a day of aerobic activity, 2-3 days per week.  Home exercise guidelines will be given to patient during program as part of exercise prescription that the participant will acknowledge.  Activity Barriers & Risk Stratification:  Activity Barriers & Cardiac Risk Stratification - 10/05/23 1534       Activity Barriers & Cardiac  Risk Stratification   Activity Barriers Balance Concerns;Joint Problems;Arthritis;Deconditioning;Shortness of Breath;Neck/Spine Problems    Cardiac Risk Stratification High          6 Minute Walk:  6 Minute Walk     Row Name 08/29/23 1619 10/05/23 0915 11/20/23 1009     6 Minute Walk   Phase Initial  Used Go- Cart and O2 Initial Initial   Distance 330 feet 766 feet 625 feet   Walk Time 6 minutes 6 minutes 6 minutes   # of Rest Breaks 1  From 2:01 to 6:00 1  3:10-4:13 to increase O2 to 15L and get SaO2 to 90% 2  2:32, 5:40 increase O2 and NRB   MPH 0.63 1.5 1.18   METS 0.6 1.54 1.08   RPE 15 14 17    Perceived Dyspnea  3 3 4    VO2 Peak 2.2 5.4 3.77   Symptoms Yes (comment) Yes (comment) No   Comments Moderate to severe SOB, RPD = 3 Moderate to severe SOB, RPD = 3, resolved with rest --   Resting HR 66 bpm 61 bpm 67 bpm   Resting BP 110/64 138/72 112/62   Resting Oxygen  Saturation  95 %  4L O2 96 %  8L O2 94 %   Exercise Oxygen  Saturation  during 6 min walk 82 % 88 % 86 %   Max Ex. HR 80 bpm 83 bpm 77 bpm   Max Ex. BP 128/80 150/80 132/70   2 Minute Post BP 118/68 124/70 122/70     Interval HR   1 Minute HR 80 75 73   2 Minute HR 79 81 76   3 Minute HR 72 86 72   4 Minute HR 70 78 74   5 Minute HR 66 76 77   6 Minute HR 64 79 66   2 Minute Post HR 64 65 70   Interval Heart Rate? Yes Yes Yes     Interval Oxygen    Interval Oxygen ? Yes Yes Yes   Baseline Oxygen  Saturation % 95 % 96 %  8L 94 %   1 Minute Oxygen  Saturation % 95 % 96 % 95 %   1 Minute Liters of Oxygen  8 L 10 L 15 L   2 Minute Oxygen  Saturation % 83 %  Stopped walk test 91 % 90 %   2 Minute Liters of Oxygen  8 L 10 L 15 L  NRB   3 Minute Oxygen  Saturation % 82 %  At rest 88 % 93 %   3 Minute Liters of Oxygen  8 L 10 L 15 L  NRB   4 Minute Oxygen  Saturation % 86 %  AT rest 90 % 96 %   4 Minute Liters of Oxygen  8 L 15 L 15 L  NRB   5 Minute Oxygen  Saturation % 91 % 92 % 90 %  86% at 5:40   5 Minute Liters of  Oxygen  8 L 15 L 15 L  increased to 25 NRB   6 Minute Oxygen  Saturation % 95 % 93 % 95 %   6 Minute Liters of Oxygen  8 L 15 L 25 L  NRB   2 Minute Post Oxygen  Saturation % 97 % 99 % 91 %   2 Minute Post Liters of Oxygen  8 L 8 L 10 L  NRB      Oxygen  Initial Assessment:  Oxygen  Initial Assessment - 11/20/23 0920       Home Oxygen    Home Oxygen  Device E-Tanks;Home Concentrator    Sleep Oxygen  Prescription Continuous    Liters per minute 5    Home Exercise Oxygen  Prescription Continuous    Liters per minute 15    Home Resting Oxygen  Prescription Continuous    Liters per minute 10    Compliance with Home Oxygen  Use Yes      Initial 6 min Walk   Oxygen  Used Continuous    Liters per minute 15   NRB     Program Oxygen  Prescription   Program Oxygen  Prescription Continuous    Liters per minute 15   NRB mask     Intervention   Short Term Goals To learn and exhibit compliance with exercise, home and travel O2 prescription;To learn and understand importance of monitoring SPO2 with pulse oximeter and demonstrate accurate use of the pulse oximeter.;To learn and understand importance of maintaining oxygen  saturations>88%;To learn and demonstrate proper pursed lip breathing techniques or other breathing techniques. ;To learn and demonstrate proper use of respiratory medications    Long  Term Goals Exhibits compliance with exercise, home  and travel O2 prescription;Verbalizes importance of monitoring SPO2 with pulse oximeter and return demonstration;Maintenance of O2 saturations>88%;Exhibits proper breathing techniques, such as pursed lip breathing or other method taught during program session;Compliance with respiratory medication;Demonstrates proper use of MDI's          Oxygen  Re-Evaluation:  Oxygen  Re-Evaluation     Row Name 11/30/23 0843             Program Oxygen  Prescription   Program Oxygen  Prescription Continuous       Liters per minute 15  NRB mask         Home Oxygen     Home Oxygen  Device E-Tanks;Home Concentrator       Sleep Oxygen  Prescription Continuous       Liters per minute 5       Home Exercise Oxygen  Prescription Continuous       Liters per minute 15       Home Resting Oxygen  Prescription Continuous       Liters per minute 10       Compliance with Home Oxygen  Use Yes         Goals/Expected Outcomes   Short Term Goals To learn and exhibit compliance with exercise, home and travel O2 prescription;To learn and understand importance of monitoring SPO2 with pulse oximeter and demonstrate accurate use of the pulse oximeter.;To learn and understand importance of maintaining oxygen  saturations>88%;To learn and demonstrate proper pursed lip breathing techniques or other breathing techniques. ;To learn and demonstrate proper use of respiratory medications       Long  Term Goals Exhibits compliance with exercise, home  and travel O2 prescription;Verbalizes importance of monitoring SPO2 with pulse oximeter and return demonstration;Maintenance of O2 saturations>88%;Exhibits proper breathing techniques, such as pursed lip breathing or other method taught during program session;Compliance with respiratory medication;Demonstrates proper use of MDI's       Goals/Expected Outcomes Compliance and understanding of oxygen  saturation monitoring and breathing techniques to decrease shortness of breath.          Oxygen  Discharge (Final Oxygen  Re-Evaluation):  Oxygen  Re-Evaluation - 11/30/23 0843       Program Oxygen  Prescription   Program Oxygen  Prescription Continuous    Liters per minute 15   NRB mask     Home Oxygen    Home Oxygen  Device  E-Tanks;Home Concentrator    Sleep Oxygen  Prescription Continuous    Liters per minute 5    Home Exercise Oxygen  Prescription Continuous    Liters per minute 15    Home Resting Oxygen  Prescription Continuous    Liters per minute 10    Compliance with Home Oxygen  Use Yes      Goals/Expected Outcomes   Short Term Goals To learn  and exhibit compliance with exercise, home and travel O2 prescription;To learn and understand importance of monitoring SPO2 with pulse oximeter and demonstrate accurate use of the pulse oximeter.;To learn and understand importance of maintaining oxygen  saturations>88%;To learn and demonstrate proper pursed lip breathing techniques or other breathing techniques. ;To learn and demonstrate proper use of respiratory medications    Long  Term Goals Exhibits compliance with exercise, home  and travel O2 prescription;Verbalizes importance of monitoring SPO2 with pulse oximeter and return demonstration;Maintenance of O2 saturations>88%;Exhibits proper breathing techniques, such as pursed lip breathing or other method taught during program session;Compliance with respiratory medication;Demonstrates proper use of MDI's    Goals/Expected Outcomes Compliance and understanding of oxygen  saturation monitoring and breathing techniques to decrease shortness of breath.          Initial Exercise Prescription:  Initial Exercise Prescription - 11/20/23 1000       Date of Initial Exercise RX and Referring Provider   Date 11/20/23    Referring Provider Dr. Almond Cone    Expected Discharge Date 02/27/24      Oxygen    Oxygen  Continuous   NRB   Liters 15    Maintain Oxygen  Saturation 88% or higher      NuStep   Level 1    SPM 72    Minutes 30    METs 1.7      Prescription Details   Frequency (times per week) 2    Duration Progress to 30 minutes of continuous aerobic without signs/symptoms of physical distress      Intensity   THRR 40-80% of Max Heartrate 58-117    Ratings of Perceived Exertion 11-13    Perceived Dyspnea 0-4      Progression   Progression Continue progressive overload as per policy without signs/symptoms or physical distress.      Resistance Training   Training Prescription Yes    Weight blue bands    Reps 10-15          Perform Capillary Blood Glucose checks as  needed.  Exercise Prescription Changes:   Exercise Prescription Changes     Row Name 10/16/23 1029 10/30/23 1029 11/30/23 1148         Response to Exercise   Blood Pressure (Admit) 128/70 106/72 128/70     Blood Pressure (Exercise) 138/68 -- --     Blood Pressure (Exit) 106/60 104/62 112/68     Heart Rate (Admit) 70 bpm 60 bpm 72 bpm     Heart Rate (Exercise) 85 bpm 74 bpm 76 bpm     Heart Rate (Exit) 73 bpm 65 bpm 64 bpm     Oxygen  Saturation (Admit) 98 % 95 % 97 %     Oxygen  Saturation (Exercise) 95 % 96 % 94 %     Oxygen  Saturation (Exit) 99 % 100 % 100 %     Rating of Perceived Exertion (Exercise) 13 12 13      Perceived Dyspnea (Exercise) 0 0 2     Symptoms None None --     Comments Off to a good start with exercise.  Quanell completed cardiac rehab. --     Duration Continue with 30 min of aerobic exercise without signs/symptoms of physical distress. Continue with 30 min of aerobic exercise without signs/symptoms of physical distress. Continue with 30 min of aerobic exercise without signs/symptoms of physical distress.     Intensity THRR unchanged THRR unchanged THRR unchanged       Progression   Progression Continue to progress workloads to maintain intensity without signs/symptoms of physical distress. Continue to progress workloads to maintain intensity without signs/symptoms of physical distress. Continue to progress workloads to maintain intensity without signs/symptoms of physical distress.     Average METs 2.1 1.8 --       Resistance Training   Training Prescription Yes Yes Yes     Weight 2 lbs 2 lbs blue bands     Reps 10-15 10-15 10-15     Time 5 Minutes 5 Minutes 10 Minutes       Interval Training   Interval Training No No --       Oxygen    Oxygen  Continuous Continuous Continuous     Liters 15 15 --  [id number]       NuStep   Level 1 1 1      SPM 79 71 78     Minutes 30 30 30      METs 2.1 1.8 1.7       Home Exercise Plan   Plans to continue exercise at --  Home (comment)  Walking --     Frequency -- Add 3 additional days to program exercise sessions. --     Initial Home Exercises Provided -- 10/25/23 --       Oxygen    Maintain Oxygen  Saturation -- 88% or higher 88% or higher        Exercise Comments:   Exercise Comments     Row Name 10/16/23 1135 10/25/23 1111 10/30/23 1130 11/28/23 1205     Exercise Comments Cipriano tolerated low intensity exercise well without symptoms. SaO2 maintained on 15 L O2. Reviewed home exercise guidelines and goals with Heitor. Mehul completed cardiac rehab and will continue walking at home at this time. Pt completed first day of group exercise. He struggled keeping his SpO2 up and needed NRB and HF [place] 8L. He exercised on the recumbent stepper for 30 min, level 1, METs 1.5. He performed warm up and cool down intermittently sitting. Used red bands. Did not discuss METs today but will in the future.       Exercise Goals and Review:   Exercise Goals     Row Name 08/29/23 1625 10/05/23 1534 11/20/23 0922         Exercise Goals   Increase Physical Activity Yes Yes Yes     Intervention Provide advice, education, support and counseling about physical activity/exercise needs.;Develop an individualized exercise prescription for aerobic and resistive training based on initial evaluation findings, risk stratification, comorbidities and participant's personal goals. Provide advice, education, support and counseling about physical activity/exercise needs.;Develop an individualized exercise prescription for aerobic and resistive training based on initial evaluation findings, risk stratification, comorbidities and participant's personal goals. Provide advice, education, support and counseling about physical activity/exercise needs.;Develop an individualized exercise prescription for aerobic and resistive training based on initial evaluation findings, risk stratification, comorbidities and participant's personal goals.      Expected Outcomes Short Term: Attend rehab on a regular basis to increase amount of physical activity.;Long Term: Add in home exercise to make exercise part of routine and to increase  amount of physical activity.;Long Term: Exercising regularly at least 3-5 days a week. Short Term: Attend rehab on a regular basis to increase amount of physical activity.;Long Term: Add in home exercise to make exercise part of routine and to increase amount of physical activity.;Long Term: Exercising regularly at least 3-5 days a week. Short Term: Attend rehab on a regular basis to increase amount of physical activity.;Long Term: Add in home exercise to make exercise part of routine and to increase amount of physical activity.;Long Term: Exercising regularly at least 3-5 days a week.     Increase Strength and Stamina Yes -- Yes     Intervention Provide advice, education, support and counseling about physical activity/exercise needs.;Develop an individualized exercise prescription for aerobic and resistive training based on initial evaluation findings, risk stratification, comorbidities and participant's personal goals. Provide advice, education, support and counseling about physical activity/exercise needs.;Develop an individualized exercise prescription for aerobic and resistive training based on initial evaluation findings, risk stratification, comorbidities and participant's personal goals. Provide advice, education, support and counseling about physical activity/exercise needs.;Develop an individualized exercise prescription for aerobic and resistive training based on initial evaluation findings, risk stratification, comorbidities and participant's personal goals.     Expected Outcomes Short Term: Increase workloads from initial exercise prescription for resistance, speed, and METs.;Short Term: Perform resistance training exercises routinely during rehab and add in resistance training at home;Long Term: Improve  cardiorespiratory fitness, muscular endurance and strength as measured by increased METs and functional capacity ( ) Short Term: Increase workloads from initial exercise prescription for resistance, speed, and METs.;Short Term: Perform resistance training exercises routinely during rehab and add in resistance training at home;Long Term: Improve cardiorespiratory fitness, muscular endurance and strength as measured by increased METs and functional capacity ( ) Short Term: Increase workloads from initial exercise prescription for resistance, speed, and METs.;Short Term: Perform resistance training exercises routinely during rehab and add in resistance training at home;Long Term: Improve cardiorespiratory fitness, muscular endurance and strength as measured by increased METs and functional capacity ( )     Able to understand and use rate of perceived exertion (RPE) scale Yes Yes Yes     Intervention Provide education and explanation on how to use RPE scale Provide education and explanation on how to use RPE scale Provide education and explanation on how to use RPE scale     Expected Outcomes Short Term: Able to use RPE daily in rehab to express subjective intensity level;Long Term:  Able to use RPE to guide intensity level when exercising independently Short Term: Able to use RPE daily in rehab to express subjective intensity level;Long Term:  Able to use RPE to guide intensity level when exercising independently Short Term: Able to use RPE daily in rehab to express subjective intensity level;Long Term:  Able to use RPE to guide intensity level when exercising independently     Able to understand and use Dyspnea scale Yes Yes Yes     Intervention Provide education and explanation on how to use Dyspnea scale Provide education and explanation on how to use Dyspnea scale Provide education and explanation on how to use Dyspnea scale     Expected Outcomes Short Term: Able to use Dyspnea scale daily in rehab  to express subjective sense of shortness of breath during exertion;Long Term: Able to use Dyspnea scale to guide intensity level when exercising independently Short Term: Able to use Dyspnea scale daily in rehab to express subjective sense of shortness of breath during exertion;Long Term: Able to use Dyspnea  scale to guide intensity level when exercising independently Short Term: Able to use Dyspnea scale daily in rehab to express subjective sense of shortness of breath during exertion;Long Term: Able to use Dyspnea scale to guide intensity level when exercising independently     Knowledge and understanding of Target Heart Rate Range (THRR) Yes Yes Yes     Intervention Provide education and explanation of THRR including how the numbers were predicted and where they are located for reference Provide education and explanation of THRR including how the numbers were predicted and where they are located for reference Provide education and explanation of THRR including how the numbers were predicted and where they are located for reference     Expected Outcomes Short Term: Able to state/look up THRR;Long Term: Able to use THRR to govern intensity when exercising independently;Short Term: Able to use daily as guideline for intensity in rehab Short Term: Able to state/look up THRR;Long Term: Able to use THRR to govern intensity when exercising independently;Short Term: Able to use daily as guideline for intensity in rehab Short Term: Able to state/look up THRR;Long Term: Able to use THRR to govern intensity when exercising independently;Short Term: Able to use daily as guideline for intensity in rehab     Understanding of Exercise Prescription Yes Yes Yes     Intervention Provide education, explanation, and written materials on patient's individual exercise prescription Provide education, explanation, and written materials on patient's individual exercise prescription Provide education, explanation, and written  materials on patient's individual exercise prescription     Expected Outcomes Short Term: Able to explain program exercise prescription;Long Term: Able to explain home exercise prescription to exercise independently Short Term: Able to explain program exercise prescription;Long Term: Able to explain home exercise prescription to exercise independently Short Term: Able to explain program exercise prescription;Long Term: Able to explain home exercise prescription to exercise independently        Exercise Goals Re-Evaluation :  Exercise Goals Re-Evaluation     Row Name 10/16/23 1135 10/25/23 1111 10/30/23 1130 11/30/23 0841       Exercise Goal Re-Evaluation   Exercise Goals Review Increase Physical Activity;Increase Strength and Stamina;Able to understand and use Dyspnea scale;Able to understand and use rate of perceived exertion (RPE) scale Increase Physical Activity;Increase Strength and Stamina;Able to understand and use Dyspnea scale;Able to understand and use rate of perceived exertion (RPE) scale;Able to check pulse independently;Knowledge and understanding of Target Heart Rate Range (THRR);Understanding of Exercise Prescription Increase Physical Activity;Increase Strength and Stamina;Able to understand and use Dyspnea scale;Able to understand and use rate of perceived exertion (RPE) scale;Able to check pulse independently;Knowledge and understanding of Target Heart Rate Range (THRR);Understanding of Exercise Prescription Increase Physical Activity;Increase Strength and Stamina;Able to understand and use Dyspnea scale;Able to understand and use rate of perceived exertion (RPE) scale;Able to check pulse independently;Knowledge and understanding of Target Heart Rate Range (THRR);Understanding of Exercise Prescription    Comments Larry was able to understand and use the RPE scale appropriately. He has a pulse oximeter to monitor his oxygen  saturation and pulse. Reviewed exercise prescription with Adrain.  Hezekiah completed the cardiac rehab program and will continue walking at home. He plans to enroll in pulmonary rehab once his wife recovers from upcoming surgery. Joesph has completed 1 exercise session. He exercises for 30 min on the Nustep. He averages 1.5 METs at level 1 on the Nustep. He performs the warmup and cooldown standing dependent on his shortness of breath/ fatigue. It is too soon to  notate any discernable progressions. Will continue to monitor and progress as able.    Expected Outcomes Milo will exercise on telemetry monitor for 5 session, then start the pulmonary rehab program. Kizer will walk 10-30 minutes, 2-3 days/week as tolerated. Korbyn will conitnuing walking 10-30 minutes, 2-3 days/week as tolerated. He will continue exercise in pulmonary rehab. Through exercise at rehab and home, the patient will decrease shortness of breath with daily activities and feel confident in carrying out an exercise regimen at home       Discharge Exercise Prescription (Final Exercise Prescription Changes):  Exercise Prescription Changes - 11/30/23 1148       Response to Exercise   Blood Pressure (Admit) 128/70    Blood Pressure (Exit) 112/68    Heart Rate (Admit) 72 bpm    Heart Rate (Exercise) 76 bpm    Heart Rate (Exit) 64 bpm    Oxygen  Saturation (Admit) 97 %    Oxygen  Saturation (Exercise) 94 %    Oxygen  Saturation (Exit) 100 %    Rating of Perceived Exertion (Exercise) 13    Perceived Dyspnea (Exercise) 2    Duration Continue with 30 min of aerobic exercise without signs/symptoms of physical distress.    Intensity THRR unchanged      Progression   Progression Continue to progress workloads to maintain intensity without signs/symptoms of physical distress.      Resistance Training   Training Prescription Yes    Weight blue bands    Reps 10-15    Time 10 Minutes      Oxygen    Oxygen  Continuous    Liters --   [id number]     NuStep   Level 1    SPM 78    Minutes 30    METs 1.7       Oxygen    Maintain Oxygen  Saturation 88% or higher          Nutrition:  Target Goals: Understanding of nutrition guidelines, daily intake of sodium 1500mg , cholesterol 200mg , calories 30% from fat and 7% or less from saturated fats, daily to have 5 or more servings of fruits and vegetables.  Biometrics:  Pre Biometrics - 11/20/23 0910       Pre Biometrics   Grip Strength 29 kg           Nutrition Therapy Plan and Nutrition Goals:   Nutrition Assessments:  MEDIFICTS Score Key: >=70 Need to make dietary changes  40-70 Heart Healthy Diet <= 40 Therapeutic Level Cholesterol Diet  Flowsheet Row INTENSIVE CARDIAC REHAB ORIENT from 10/05/2023 in [facility] for Heart, Vascular, & Lung Health  Picture Your Plate Total Score on Admission 64   Picture Your Plate Scores: <59 Unhealthy dietary pattern with much room for improvement. 41-50 Dietary pattern unlikely to meet recommendations for good health and room for improvement. 51-60 More healthful dietary pattern, with some room for improvement.  >60 Healthy dietary pattern, although there may be some specific behaviors that could be improved.    Nutrition Goals Re-Evaluation:   Nutrition Goals Discharge (Final Nutrition Goals Re-Evaluation):   Psychosocial: Target Goals: Acknowledge presence or absence of significant depression and/or stress, maximize coping skills, provide positive support system. Participant is able to verbalize types and ability to use techniques and skills needed for reducing stress and depression.  Initial Review & Psychosocial Screening:  Initial Psych Review & Screening - 11/20/23 9081       Initial Review   Current issues with None Identified  Family Dynamics   Good Support System? Yes   Airon has his wife, 3 children and 3 grandchildren for support   Comments wife, 3 children, 3 grandchildren      Barriers   Psychosocial barriers to participate in program  There are no identifiable barriers or psychosocial needs.      Screening Interventions   Interventions Encouraged to exercise          Quality of Life Scores:  Quality of Life - 10/05/23 1536       Quality of Life Scores   Health/Function Pre 22.23 %    Socioeconomic Pre 23.58 %    Psych/Spiritual Pre 27.14 %    Family Pre 27 %    GLOBAL Pre 24.24 %         Scores of 19 and below usually indicate a poorer quality of life in these areas.  A difference of  2-3 points is a clinically meaningful difference.  A difference of 2-3 points in the total score of the Quality of Life Index has been associated with significant improvement in overall quality of life, self-image, physical symptoms, and general health in studies assessing change in quality of life.  PHQ-9: Review Flowsheet  More data exists      11/20/2023 10/05/2023 08/29/2023 08/02/2023 07/06/2023  Depression screen PHQ 2/9  Decreased Interest 1 2 2  0 0  Down, Depressed, Hopeless 0 0 0 0 0  PHQ - 2 Score 1 2 2  0 0  Altered sleeping 0 0 0 0 -  Tired, decreased energy 1 2 2  0 -  Change in appetite 0 0 0 0 -  Feeling bad or failure about yourself  0 0 0 0 -  Trouble concentrating 0 0 0 0 -  Moving slowly or fidgety/restless 0 0 0 0 -  Suicidal thoughts 0 0 0 0 -  PHQ-9 Score 2 4 4  0 -  Difficult doing work/chores Not difficult at all Not difficult at all Not difficult at all Not difficult at all -   Interpretation of Total Score  Total Score Depression Severity:  1-4 = Minimal depression, 5-9 = Mild depression, 10-14 = Moderate depression, 15-19 = Moderately severe depression, 20-27 = Severe depression   Psychosocial Evaluation and Intervention:  Psychosocial Evaluation - 11/20/23 0919       Psychosocial Evaluation & Interventions   Interventions Encouraged to exercise with the program and follow exercise prescription    Comments No barriers or concerns were identified at this time.    Expected Outcomes For Andersen to  continue to be free of barriers or psychosocial concerns while participating in pulmonary rehab.    Continue Psychosocial Services  No Follow up required          Psychosocial Re-Evaluation:  Psychosocial Re-Evaluation     Row Name 11/27/23 1308             Psychosocial Re-Evaluation   Current issues with None Identified       Comments Darden is scheduled to start PR on 11/28/23. No new psy/sco barriers or concerns since orientation.       Expected Outcomes For Ahyan to participate in PR free of any psy/soc barriers or concerns       Interventions Encouraged to attend Pulmonary Rehabilitation for the exercise       Continue Psychosocial Services  No Follow up required          Psychosocial Discharge (Final Psychosocial Re-Evaluation):  Psychosocial Re-Evaluation - 11/27/23 1308  Psychosocial Re-Evaluation   Current issues with None Identified    Comments Mehul is scheduled to start PR on 11/28/23. No new psy/sco barriers or concerns since orientation.    Expected Outcomes For Leverett to participate in PR free of any psy/soc barriers or concerns    Interventions Encouraged to attend Pulmonary Rehabilitation for the exercise    Continue Psychosocial Services  No Follow up required          Education: Education Goals: Education classes will be provided on a weekly basis, covering required topics. Participant will state understanding/return demonstration of topics presented.  Learning Barriers/Preferences:  Learning Barriers/Preferences - 11/20/23 0919       Learning Barriers/Preferences   Learning Barriers Hearing;Sight    Learning Preferences Skilled Demonstration;Group Instruction;Individual Instruction          Education Topics: Know Your Numbers Group instruction that is supported by a PowerPoint presentation. Instructor discusses importance of knowing and understanding resting, exercise, and post-exercise oxygen  saturation, heart rate, and blood pressure.  Oxygen  saturation, heart rate, blood pressure, rating of perceived exertion, and dyspnea are reviewed along with a normal range for these values.    Exercise for the Pulmonary Patient Group instruction that is supported by a PowerPoint presentation. Instructor discusses benefits of exercise, core components of exercise, frequency, duration, and intensity of an exercise routine, importance of utilizing pulse oximetry during exercise, safety while exercising, and options of places to exercise outside of rehab.    MET Level  Group instruction provided by PowerPoint, verbal discussion, and written material to support subject matter. Instructor reviews what METs are and how to increase METs.    Pulmonary Medications Verbally interactive group education provided by instructor with focus on inhaled medications and proper administration. Flowsheet Row PULMONARY REHAB OTHER RESPIRATORY from 09/19/2019 in [facility] for Heart, Vascular, & Lung Health  Date 09/19/19  Educator handout  Instruction Review Code 2- Demonstrated Understanding    Anatomy and Physiology of the Respiratory System Group instruction provided by PowerPoint, verbal discussion, and written material to support subject matter. Instructor reviews respiratory cycle and anatomical components of the respiratory system and their functions. Instructor also reviews differences in obstructive and restrictive respiratory diseases with examples of each.    Oxygen  Safety Group instruction provided by PowerPoint, verbal discussion, and written material to support subject matter. There is an overview of "What is Oxygen " and "Why do we need it".  Instructor also reviews how to create a safe environment for oxygen  use, the importance of using oxygen  as prescribed, and the risks of noncompliance. There is a brief discussion on traveling with oxygen  and resources the patient may utilize.   Oxygen  Use Group instruction  provided by PowerPoint, verbal discussion, and written material to discuss how supplemental oxygen  is prescribed and different types of oxygen  supply systems. Resources for more information are provided.    Breathing Techniques Group instruction that is supported by demonstration and informational handouts. Instructor discusses the benefits of pursed lip and diaphragmatic breathing and detailed demonstration on how to perform both.     Risk Factor Reduction Group instruction that is supported by a PowerPoint presentation. Instructor discusses the definition of a risk factor, different risk factors for pulmonary disease, and how the heart and lungs work together. Flowsheet Row PULMONARY REHAB OTHER RESPIRATORY from 01/29/2019 in [facility] for Heart, Vascular, & Lung Health  Date 01/08/19  Educator --  [Handout]    Pulmonary Diseases Group instruction provided  by PowerPoint, verbal discussion, and written material to support subject matter. Instructor gives an overview of the different type of pulmonary diseases. There is also a discussion on risk factors and symptoms as well as ways to manage the diseases.   Stress and Energy Conservation Group instruction provided by PowerPoint, verbal discussion, and written material to support subject matter. Instructor gives an overview of stress and the impact it can have on the body. Instructor also reviews ways to reduce stress. There is also a discussion on energy conservation and ways to conserve energy throughout the day.   Warning Signs and Symptoms Group instruction provided by PowerPoint, verbal discussion, and written material to support subject matter. Instructor reviews warning signs and symptoms of stroke, heart attack, cold and flu. Instructor also reviews ways to prevent the spread of infection. Flowsheet Row PULMONARY REHAB OTHER RESPIRATORY from 11/30/2023 in [facility] for Heart,  Vascular, & Lung Health  Date 11/30/23  Educator RN  Instruction Review Code 1- Verbalizes Understanding    Other Education Group or individual verbal, written, or video instructions that support the educational goals of the pulmonary rehab program. Flowsheet Row PULMONARY REHAB OTHER RESPIRATORY from 11/07/2019 in [facility] for Heart, Vascular, & Lung Health  Date 11/07/19  [facility] your numbers]  Educator Handout  Instruction Review Code 2- Demonstrated Understanding     Knowledge Questionnaire Score:  Knowledge Questionnaire Score - 11/20/23 0928       Knowledge Questionnaire Score   Pre Score 15//18          Core Components/Risk Factors/Patient Goals at Admission:  Personal Goals and Risk Factors at Admission - 11/20/23 0919       Core Components/Risk Factors/Patient Goals on Admission   Improve shortness of breath with ADL's Yes    Intervention Provide education, individualized exercise plan and daily activity instruction to help decrease symptoms of SOB with activities of daily living.    Expected Outcomes Short Term: Improve cardiorespiratory fitness to achieve a reduction of symptoms when performing ADLs;Long Term: Be able to perform more ADLs without symptoms or delay the onset of symptoms          Core Components/Risk Factors/Patient Goals Review:   Goals and Risk Factor Review     Row Name 11/27/23 1310             Core Components/Risk Factors/Patient Goals Review   Personal Goals Review Improve shortness of breath with ADL's;Develop more efficient breathing techniques such as purse lipped breathing and diaphragmatic breathing and practicing self-pacing with activity.       Review Monthly review of patient's Core Components/Risk Factors/Patient Goals are as follows: Kayd has not started PR yet. Unable to asses his goals at this time.       Expected Outcomes To improve shortness of breath with ADL's and develop more efficient  breathing techniques such as purse lipped breathing and diaphragmatic breathing; and practicing self-pacing with activity.          Core Components/Risk Factors/Patient Goals at Discharge (Final Review):   Goals and Risk Factor Review - 11/27/23 1310       Core Components/Risk Factors/Patient Goals Review   Personal Goals Review Improve shortness of breath with ADL's;Develop more efficient breathing techniques such as purse lipped breathing and diaphragmatic breathing and practicing self-pacing with activity.    Review Monthly review of patient's Core Components/Risk Factors/Patient Goals are as follows: Jarmaine has not started PR yet. Unable to asses his  goals at this time.    Expected Outcomes To improve shortness of breath with ADL's and develop more efficient breathing techniques such as purse lipped breathing and diaphragmatic breathing; and practicing self-pacing with activity.          ITP Comments:  ITP Comments     Row Name 08/29/23 1534 10/05/23 0756 10/16/23 1638       ITP Comments Wilbert Bihari, MD: Medical Director. Introduction to the [facility] / Intensive Cardiac Rehab. Initial oriention packet reviewed with the patient. Wilbert Bihari, MD: Medical Director. Introduction to the [facility] / Intensive Cardiac Rehab. Initial oriention packet reviewed with the patient. Patient brought back for 6 minute walk test today 30-Day ITP review. Jamael started the cardiac rehab program today and will attend 5 sessions and transfer to the pulmonary rehab program. Oxygen  saturation was maintained in the mid to upper 90s on 15L O2.        Comments: Pt is making expected progress toward Pulmonary Rehab goals after completing 2 session(s). Recommend continued exercise, life style modification, education, and utilization of breathing techniques to increase stamina and strength, while also decreasing shortness of breath with exertion.  Dr. Slater Staff is Medical  Director for Pulmonary Rehab at [facility].

## 2023-12-07 ENCOUNTER — Encounter (HOSPITAL_COMMUNITY)

## 2023-12-07 ENCOUNTER — Other Ambulatory Visit: Payer: Self-pay

## 2023-12-07 DIAGNOSIS — J8410 Pulmonary fibrosis, unspecified: Secondary | ICD-10-CM | POA: Diagnosis not present

## 2023-12-07 DIAGNOSIS — J849 Interstitial pulmonary disease, unspecified: Secondary | ICD-10-CM | POA: Diagnosis not present

## 2023-12-07 DIAGNOSIS — I272 Pulmonary hypertension, unspecified: Secondary | ICD-10-CM | POA: Diagnosis not present

## 2023-12-07 DIAGNOSIS — Z952 Presence of prosthetic heart valve: Secondary | ICD-10-CM | POA: Diagnosis not present

## 2023-12-07 DIAGNOSIS — I10 Essential (primary) hypertension: Secondary | ICD-10-CM | POA: Diagnosis not present

## 2023-12-07 DIAGNOSIS — J9621 Acute and chronic respiratory failure with hypoxia: Secondary | ICD-10-CM | POA: Diagnosis not present

## 2023-12-07 DIAGNOSIS — N1831 Chronic kidney disease, stage 3a: Secondary | ICD-10-CM | POA: Diagnosis not present

## 2023-12-07 DIAGNOSIS — Z944 Liver transplant status: Secondary | ICD-10-CM | POA: Diagnosis not present

## 2023-12-08 ENCOUNTER — Other Ambulatory Visit (HOSPITAL_COMMUNITY): Payer: Self-pay

## 2023-12-08 ENCOUNTER — Encounter (HOSPITAL_COMMUNITY)

## 2023-12-08 ENCOUNTER — Telehealth (HOSPITAL_COMMUNITY): Payer: Self-pay

## 2023-12-08 MED ORDER — PREDNISONE 10 MG PO TABS
10.0000 mg | ORAL_TABLET | ORAL | 0 refills | Status: DC
  Filled 2023-12-08: qty 31, 14d supply, fill #0

## 2023-12-08 MED ORDER — FUROSEMIDE 20 MG PO TABS
40.0000 mg | ORAL_TABLET | Freq: Every day | ORAL | 0 refills | Status: DC
  Filled 2023-12-08 – 2024-01-01 (×2): qty 60, 30d supply, fill #0

## 2023-12-08 NOTE — Telephone Encounter (Signed)
 Called to check on William [name] after hospitalization. LVM with department number

## 2023-12-11 ENCOUNTER — Encounter (HOSPITAL_COMMUNITY)

## 2023-12-12 ENCOUNTER — Encounter (HOSPITAL_COMMUNITY)
Admission: RE | Admit: 2023-12-12 | Discharge: 2023-12-12 | Disposition: A | Discharge status: Home / Self Care | Source: Ambulatory Visit | Attending: Cardiovascular Disease | Admitting: Cardiovascular Disease

## 2023-12-12 DIAGNOSIS — J84111 Idiopathic interstitial pneumonia, not otherwise specified: Secondary | ICD-10-CM | POA: Diagnosis present

## 2023-12-12 LAB — GLUCOSE, CAPILLARY: Glucose-Capillary: 206 mg/dL — ABNORMAL HIGH (ref 70–99)

## 2023-12-12 NOTE — Progress Notes (Signed)
 Daily Session Note  Patient Details  Name: William [name] MRN: [id number] Date of Birth: 10-13-1950 Referring Provider:   Conrad Ports Pulmonary Rehab Walk Test from 11/20/2023 in [facility] for Heart, Vascular, & Lung Health  Referring Provider Dr. Almond Cone    Encounter Date: 12/12/2023  Check In:  Session Check In - 12/12/23 1104       Check-In   Supervising physician immediately available to respond to emergencies [facility] MD immediately available    Physician(s) Rosaline Skains, NP    Location [facility]    Staff Present Ronal Levin, RN, BSN;Intisar Claudio Claudene, RT;Randi [facility] BS, ACSM-CEP, Exercise Physiologist;Kaylee Nicholaus, MS, ACSM-CEP, Exercise Physiologist    Virtual Visit No    Medication changes reported     No    Fall or balance concerns reported    No    Tobacco Cessation No Change    Warm-up and Cool-down Performed as group-led instruction    Resistance Training Performed Yes    VAD Patient? No    PAD/SET Patient? No      Pain Assessment   Currently in Pain? No/denies    Multiple Pain Sites No          Capillary Blood Glucose: Results for orders placed or performed during the hospital encounter of 11/30/23 (from the past 24 hours)  Glucose, capillary     Status: Abnormal   Collection Time: 12/12/23 10:24 AM  Result Value Ref Range   Glucose-Capillary 206 (H) 70 - 99 mg/dL   *Note: Due to a large number of results and/or encounters for the requested time period, some results have not been displayed. A complete set of results can be found in Results Review.      Social History   Tobacco Use  Smoking Status Former   Current packs/day: 0.00   Average packs/day: 0.5 packs/day for 17.0 years (8.5 ttl pk-yrs)   Types: Cigarettes   Start date: 75   Quit date: 35   Years since quitting: 42.8  Smokeless Tobacco Never  Tobacco Comments   QUIT I 1983    Goals Met:  Proper associated with RPD/PD & O2  Sat [place] with exercise equipment Exercise tolerated well No report of concerns or symptoms today Strength training completed today  Goals Unmet:  Not Applicable  Comments: Service time is from 1011 to 1136.    Dr. Slater Staff is Medical Director for Pulmonary Rehab at [facility].

## 2023-12-13 ENCOUNTER — Encounter (HOSPITAL_COMMUNITY)

## 2023-12-13 LAB — GLUCOSE, CAPILLARY: Glucose-Capillary: 194 mg/dL — ABNORMAL HIGH (ref 70–99)

## 2023-12-14 ENCOUNTER — Other Ambulatory Visit: Payer: Self-pay | Admitting: Pharmacy Technician

## 2023-12-14 ENCOUNTER — Other Ambulatory Visit: Payer: Self-pay

## 2023-12-14 ENCOUNTER — Other Ambulatory Visit (HOSPITAL_COMMUNITY): Payer: Self-pay

## 2023-12-14 ENCOUNTER — Encounter (INDEPENDENT_AMBULATORY_CARE_PROVIDER_SITE_OTHER): Payer: Self-pay

## 2023-12-14 ENCOUNTER — Encounter (HOSPITAL_COMMUNITY)
Admission: RE | Admit: 2023-12-14 | Discharge: 2023-12-14 | Disposition: A | Source: Ambulatory Visit | Attending: Pulmonary Disease

## 2023-12-14 DIAGNOSIS — J84111 Idiopathic interstitial pneumonia, not otherwise specified: Secondary | ICD-10-CM

## 2023-12-14 NOTE — Progress Notes (Signed)
 Clinical Intervention Note  Clinical Intervention Notes: Patient reported being on a prednisone  taper until November 15th. Per chart, patient was admitted to [facility] with hypoxia and started on a large steroid dose. Per [employer], prednisone  mayreduce efficacy of tacrolimus , however patient is being followed by both pulm and transplant team at [facility]. No DDIs identified with [employer]    Clinical Intervention Outcomes: Prevention of an adverse drug event   Advertising Account Planner

## 2023-12-14 NOTE — Progress Notes (Signed)
 Daily Session Note  Patient Details  Name: William [name] MRN: [id number] Date of Birth: 12/14/1950 Referring Provider:   Conrad Ports Pulmonary Rehab Walk Test from 11/20/2023 in [facility] for [facility], Vascular, & Lung Health  Referring Provider Dr. Almond Cone    Encounter Date: 12/14/2023  Check In:  Session Check In - 12/14/23 1133       Check-In   Supervising physician immediately available to respond to emergencies [facility] MD immediately available    Physician(s) Orren Fabry, NP    Location [facility]    Staff Present Augustin Sharps, Neita Moats, MS, ACSM-CEP, Exercise Physiologist;Carlette Bernett, RN, Mallory Parkins, MS, ACSM-CEP, CCRP, Exercise Physiologist;Maria Cyrus, RN, BSN;Johnny Porter, MS, Exercise Physiologist    Virtual Visit No    Medication changes reported     No    Fall or balance concerns reported    No    Tobacco Cessation No Change    Warm-up and Cool-down Performed as group-led instruction    Resistance Training Performed Yes    VAD Patient? No    PAD/SET Patient? No      Pain Assessment   Currently in Pain? No/denies    Multiple Pain Sites No          Capillary Blood Glucose: No results found. However, due to the size of the patient record, not all encounters were searched. Please check Results Review for a complete set of results.    Social History   Tobacco Use  Smoking Status Former   Current packs/day: 0.00   Average packs/day: 0.5 packs/day for 17.0 years (8.5 ttl pk-yrs)   Types: Cigarettes   Start date: 95   Quit date: 77   Years since quitting: 42.8  Smokeless Tobacco Never  Tobacco Comments   QUIT I 1983    Goals Met:  Proper associated with RPD/PD & O2 Sat Independence with exercise equipment Exercise tolerated well No report of concerns or symptoms today Strength training completed today  Goals Unmet:  Not Applicable  Comments: Service time is from 1010 to  1141.    Dr. Slater Staff is Medical Director for Pulmonary Rehab at [facility].

## 2023-12-14 NOTE — Progress Notes (Signed)
 Specialty Pharmacy Refill Coordination Note  William [name] is a 73 y.o. male contacted today regarding refills of specialty medication(s) Mycophenolate  Mofetil (CELLCEPT )    Patient requested (Patient-Rptd) Pickup at [facility] date: (Patient-Rptd) 12/19/23   Medication will be filled on: 12/18/2023

## 2023-12-15 ENCOUNTER — Encounter: Payer: Self-pay | Admitting: Family Medicine

## 2023-12-15 ENCOUNTER — Ambulatory Visit: Admitting: Family Medicine

## 2023-12-15 ENCOUNTER — Encounter (HOSPITAL_COMMUNITY)

## 2023-12-15 VITALS — BP 138/79 | HR 69 | Temp 98.0°F | Ht 69.0 in | Wt 206.0 lb

## 2023-12-15 DIAGNOSIS — Z7984 Long term (current) use of oral hypoglycemic drugs: Secondary | ICD-10-CM

## 2023-12-15 DIAGNOSIS — N2889 Other specified disorders of kidney and ureter: Secondary | ICD-10-CM

## 2023-12-15 DIAGNOSIS — J9611 Chronic respiratory failure with hypoxia: Secondary | ICD-10-CM

## 2023-12-15 DIAGNOSIS — I5032 Chronic diastolic (congestive) heart failure: Secondary | ICD-10-CM | POA: Diagnosis not present

## 2023-12-15 DIAGNOSIS — J849 Interstitial pulmonary disease, unspecified: Secondary | ICD-10-CM

## 2023-12-15 DIAGNOSIS — E119 Type 2 diabetes mellitus without complications: Secondary | ICD-10-CM

## 2023-12-15 DIAGNOSIS — J9621 Acute and chronic respiratory failure with hypoxia: Secondary | ICD-10-CM | POA: Diagnosis not present

## 2023-12-15 DIAGNOSIS — R739 Hyperglycemia, unspecified: Secondary | ICD-10-CM

## 2023-12-15 DIAGNOSIS — E118 Type 2 diabetes mellitus with unspecified complications: Secondary | ICD-10-CM

## 2023-12-15 NOTE — Progress Notes (Signed)
 12/15/2023  CC:  Chief Complaint  Patient presents with   Hospitalization Follow-up   Patient is a 73 y.o. male who presents for  hospital follow up. Dates hospitalized: 10/24 to 12/08/2023. Days since d/c from hospital: 7 days Patient was discharged from hospital to home. Reason for admission to hospital: Admitted for subacute on chronic hypoxemia at pulmonary rehab.   I have reviewed patient's discharge summary plus pertinent specific notes, labs, and imaging from the hospitalization.  He was treated for ILD flare and possible CAP, though there was concern for progression of his ILD given ongoing high O2 requirements.  CURRENTLY: He says he feels very well other than when he walks.  He gets short of breath fairly quickly and requires more oxygen  but then when he stops he recovers pretty quick.  He has no chest pain, dizziness, lower extremity swelling, palpitations, nausea, or diaphoresis No orthopnea or PND. Since discharge home he has been requiring only 2 to 3 L of oxygen . His weight has been 202 to 204 pounds (it was 204 pounds upon discharge).  He had a similar flare at the end of July this year that required admission and same treatment.  Discharge medications: He was instructed to pause acitretin . Start prednisone  taper, Lasix  40 mg daily for 30 days, mycophenolate  500 mg in the morning and 250 mg in the evening.  Continue amlodipine  10 mg daily, aspirin 81 mg daily, metformin  500 mg twice daily, Lopressor  100 mg twice daily, pantoprazole  40 mg daily, MiraLAX powder daily as needed, Crestor  10 mg daily, Senokot S1 tab twice daily, tacrolimus  2 mg in the morning and 1 mg in the evening. Medication reconciliation was done today and patient is taking meds as recommended by discharging hospitalist/specialist.    ROS as above, plus--> no fevers, no cough, no HAs, no rashes, no melena/hematochezia.  No polyuria or polydipsia.  No myalgias or arthralgias.  No focal weakness,  paresthesias, or tremors.  No acute vision or hearing abnormalities.  No dysuria or unusual/new urinary urgency or frequency.  No recent changes in lower legs. No n/v/d or abd pain.     PMH:  Past Medical History:  Diagnosis Date   AAA (abdominal aortic aneurysm) 03/2014   repaired 2020   Ascending aortic aneurysm    unrepaired. Stable at 4.3 cm on imaging by [facility] thoracic surg 10/2019 and 10/2020.   Bilateral renal cysts    simple (03/2014 MRI)   CAD (coronary artery disease)    Cholelithiases 2018   asymptomatic   Chronic diastolic heart failure (HCC)    Chronic renal insufficiency, stage 3 (moderate) 2022   baseline sCr 1.3-1.5 (GFR 55)   COVID-19 virus infection 02/2020   sotrovimab  infusion   Diabetes mellitus with complication (HCC) 07/2014   A1c 6.8%. A1c 5.4% 11/2019   Diverticulosis    Gout    initially dx'd by diag arthrocentesis of elbow effusion in winter 2021, 3 episodes, all came after being started on chlorthalidone ->I d/c'd chlorthal.   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  Improving + off oxygen  as of 3 mo s/p liver transplant.  Doing well/resolved as of 02/2020 [facility] pulm f/u->they'll continue to follow. No worsening on CT imaging at [facility] 03/2023   History of blood transfusion 2018 X 4 dates   Chronic GI blood loss of unknown location despite full GI endoscopic eval, etc   History of liver transplant (HCC) 07/2019   [facility]   Hyperkalemia    Lokelma    Hyperlipemia,  mixed    elevated LFTs when on statins.     Hypertension    Cr bump 04/01/16 so I changed benicar -hct to benicar  plain and added amlodipine  5 mg.   Increased prostate specific antigen (PSA) velocity 2021   0.11 to 4.77 from 2020 to 2021 (he got a liver transplant and was put on antirejection meds between these psa checks).   Iron  deficiency anemia 05/2016   Acute blood loss anemia: hospitalized, required transfusion x 3 U: colonoscopy and capsule study unrevealing.  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  W/u for ? hemolytic anemia to be pursued by hematologist as outpt.  Dr. Charlean, GI at [facility] following, too---he rec'd onc do bone marrow bx as of Jan 2019   Iron  deficiency anemia due to chronic blood loss 2018/19   GI: transfusions x >20 required; multiple endoscopies and bleeding scans unrevealing. Lysteda  and octreotide  + monthly iron  infusions as of 04/2017. Iron  infusions changed to every other month as of 09/2018 hem f/u.   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  Changes noted on all CXRs in 2018/2019.  Liver Transplant eval 03/2018->mild restriction on PFTs but no obstruction.  See further details in PM[facility] pulm fibrosis section   Microscopic hematuria    Eval unremarkable by Dr. Maryanne.   Nonmelanoma skin cancer 08/15/2018   2020 BCC nose, excised.  2022 R side of neck->mohs   Obesity    Pulmonary fibrosis (HCC)    PFTs: restrictive lung dz ([facility] Liver transplant eval 05/2018). [facility] pulm eval felt this was likely hepatopulmonary syndrome->causing his hypoxia->low likelihood of progression (as of 10/04/18 transplant clinic f/u). Pulm rehab helping as of 2020/2021. OFF oxygen  as of 10/2019 transp f/u.   Pulmonary fibrosis (HCC)    Gradual worsening of objective testing 2020 05/2023, has had return of oxygen  requirement, diffuse parenchymal lung disease of unclear etiology.   Spleen enlarged     PSH:  Past Surgical History:  Procedure Laterality Date   ABDOMINAL AORTIC ANEURYSM REPAIR  08/23/2018   [facility]   CARDIAC CATHETERIZATION     05/2023 severe 3 vessel obstructive CAD, 1 of 3  bypass grafts patent.  Mod AS   Cardiac CT     04/07/23 OCCLUDED SAPHENOUS VEIN GRAFTS   CARDIOVASCULAR STRESS TEST  01/17/2012; 05/04/16   Normal stress nuclear study x 2 (2018--Normal perfusion. LVEF 66% with normal wall motion. This is a low risk study).   CERVICAL DISCECTOMY  1992   COLONOSCOPY N/A 05/27/2016   No site/explanation for blood loss found.  Erythematous mucosa in cecum and ascending colon--Cecal bx: normal.  Procedure: COLONOSCOPY;  Surgeon: Belvie Just, MD;  Location: [facility] ENDOSCOPY;  Service: Endoscopy;  Laterality: N/A;   COLONOSCOPY     01/07/22 diverticulosis but NO POLYPS.  No further recommended   COLONOSCOPY W/ POLYPECTOMY  09/10/2014   Polypectomy x 2: recall 5 yrs (Dr. Kristie).   COLONOSCOPY WITH PROPOFOL  N/A 01/07/2022   NO FURTHER SCREENING INDICATED.  Procedure: COLONOSCOPY WITH PROPOFOL ;  Surgeon: Just Belvie, MD;  Location: [facility] ENDOSCOPY;  Service: Gastroenterology;  Laterality: N/A;   CORONARY ANGIOPLASTY     CORONARY ARTERY BYPASS GRAFT  03/2006   CT ABDOMEN WITH CONTRAST ([facility] HX)  11/2018   CT CHEST WWO CONTAST ([facility] HX)  07/11/2019   No significant change in interstitial lung disease with possible underlying hepatopulmonary syndrome, Increased ascites in the setting of cirrhosis, tiny new right basilar nodule   DEXA  03/07/2019   T score 0.2/NORMAL   ENTEROSCOPY N/A 07/31/2016   Procedure: ENTEROSCOPY;  Surgeon: Aneita Gwendlyn DASEN, MD;  Location: [facility] ENDOSCOPY;  Service: Endoscopy;  Laterality: N/A;   ENTEROSCOPY N/A 12/02/2016   Procedure: ENTEROSCOPY;  Surgeon: Rollin Dover, MD;  Location: [facility] ENDOSCOPY;  Service: Endoscopy;  Laterality: N/A;   ESOPHAGOGASTRODUODENOSCOPY N/A 05/25/2016   No site/explanation for blood loss found.  Procedure: ESOPHAGOGASTRODUODENOSCOPY (EGD);  Surgeon: Renaye Sous, MD;  Location: [facility] ENDOSCOPY;  Service: Endoscopy;  Laterality: N/A;   ESOPHAGOGASTRODUODENOSCOPY N/A 09/23/2016   Procedure: ESOPHAGOGASTRODUODENOSCOPY (EGD);   Surgeon: Rollin Dover, MD;  Location: [facility] ENDOSCOPY;  Service: Endoscopy;  Laterality: N/A;   ESOPHAGOGASTRODUODENOSCOPY (EGD) WITH PROPOFOL  N/A 08/21/2018   Procedure: ESOPHAGOGASTRODUODENOSCOPY (EGD) WITH PROPOFOL ;  Surgeon: Rollin Dover, MD;  Location: [facility] ENDOSCOPY;  Service: Endoscopy;  Laterality: N/A;   GIVENS CAPSULE STUDY N/A 05/27/2016   No source identified.  Repeat 10/2016--results pending.  Procedure: GIVENS CAPSULE STUDY;  Surgeon: Dover Rollin, MD;  Location: [facility] ENDOSCOPY;  Service: Endoscopy;  Laterality: N/A;   GIVENS CAPSULE STUDY N/A 10/15/2016   Procedure: GIVENS CAPSULE STUDY;  Surgeon: Rollin Dover, MD;  Location: [facility] ENDOSCOPY;  Service: Endoscopy;  Laterality: N/A;   GIVENS CAPSULE STUDY N/A 02/13/2017   Procedure: GIVENS CAPSULE STUDY;  Surgeon: Rollin Dover, MD;  Location: [facility] ENDOSCOPY;  Service: Endoscopy;  Laterality: N/A;   LIVER TRANSPLANT  08/02/2019   For NASH cirrhosis. [facility]   NECK SURGERY  1991   NM GI BLOOD LOSS  01/27/2017   NEGATIVE   SMALL BOWEL ENTEROSCOPY  10/2016   ([facility], Dr. Charlean: Double balloon enteroscopy--to the level of proximal ileum) jejunal polyps- which were removed--not bleeding & benign.  No source of bleeding was identified.   TRANSCATHETER AORTIC VALVE REPLACEMENT, TRANSFEMORAL     06/2023   TRANSTHORACIC ECHOCARDIOGRAM  05/25/2016; 10/2017   05/2016: EF 60%, normal wall motion, grd II DD, mild aortic stenosis, dilated aortic root and ascending aorta. 10/2017: Mild LVH; no mention made of LV function.  Moderate aorticstenosis with mean gradient 19 peak gradient 41. AVA 1.1 cm^2.  05/2018 EF 55%,4.3 cm ascend aneur, mild AS. 10/2020 EF 60-65%, normal diast fxn, mod AV sclerosis w/out sten/regurg.   TRANSTHORACIC ECHOCARDIOGRAM     10/27/22; normal LV and RV fxn,+DD, mild LVH, mod AS, otherwise valves fine.  No change 03/2023.   VASECTOMY  1977    MEDS:  Outpatient Medications Prior to Visit  Medication Sig Dispense Refill   acetaminophen   (TYLENOL ) 500 MG tablet Take 1,000 mg by mouth as needed.     acitretin  (SORIATANE ) 10 MG capsule Take 1 capsule by mouth daily 30 capsule 5   amLODipine  (NORVASC ) 10 MG tablet Take 1 tablet (10 mg total) by mouth daily. 90 tablet 3   ammonium lactate  (AMLACTIN) 12 % cream Apply 2 times daily to forearms and hands in preparation for treatment with Efudex  (fluorouracil ) (Patient taking differently: Apply 1 Application topically as needed for dry skin.) 385 g 99   amoxicillin  (AMOXIL ) 500 MG capsule Prophylaxis for dental procedure. Take 2 grams (4x 500mg  tablets) 1 hour  before procedure. 4 capsule 0   aspirin EC 81 MG tablet Take 81 mg by mouth daily. Swallow whole.     furosemide  (LASIX ) 20 MG tablet Take 2 tablets (40 mg total) by mouth once daily 60 tablet 0   metFORMIN  (GLUCOPHAGE ) 500 MG tablet Take 1 tablet (500 mg total) by mouth 2 (two) times daily with a meal. 180 tablet 3   metoprolol  tartrate (LOPRESSOR ) 100 MG tablet Take 1 tablet (100 mg total) by mouth 2 (two) times daily. 90 tablet 0   mycophenolate  (CELLCEPT ) 250 MG capsule Take 2 capsules by mouth in the morning and 1 capsule in the evening. 270 capsule 3   pantoprazole  (PROTONIX ) 40 MG tablet Take 1 tablet (40 mg total) by mouth daily. 90 tablet 3   predniSONE  (DELTASONE ) 10 MG tablet Take 4 tablets by mouth once daily for 2 days, THEN take 3 tablets daily for 3 days, THEN take 2 tablets daily for 3 days, THEN take 1.5 tablets daily for 3 days, THEN take 1 tablet daily for 3 days. 31 tablet 0   rosuvastatin  (CRESTOR ) 10 MG tablet Take 1 tablet (10 mg total) by mouth daily. 90 tablet 3   tacrolimus  (PROGRAF ) 1 MG capsule Take 2 capsules (2 mg total) by mouth every morning AND 1 capsule (1 mg total) every evening. 270 capsule 3   triamcinolone  cream (KENALOG ) 0.1 % Apply 2 times a day to rash. Follow with CeraVe Cream.  Stop when smooth. (Patient not taking: Reported on 12/15/2023) 80 g 2   Facility-Administered Medications Prior to  Visit  Medication Dose Route Frequency Provider Last Rate Last Admin   heparin  lock flush 100 unit/mL  500 Units Intracatheter Daily PRN Lanny Callander, MD       sodium chloride  flush (NS) 0.9 % injection 10 mL  10 mL Intracatheter PRN Lanny Callander, MD        Physical Exam    12/15/2023    3:06 PM 12/15/2023    3:04 PM 12/01/2023   12:03 AM  Vitals with BMI  Height  5' 9   Weight  206 lbs   BMI  30.41   Systolic 138 155 874  Diastolic 79 72 69  Pulse  69 69   02 sat on 2L today is 90%  General: Alert and well-appearing. Affect is pleasant, thought and speech are lucid. No pallor, cyanosis, or jaundice. Cardiovascular: Regular rhythm and rate without murmur.  No rub.  S1 and S2 are moderately distant. Lungs show very soft and subtle inspiratory crackles in the bases, right greater than left.  Otherwise clear.  No wheezing.  Aeration is good.  Breathing is not labored. Extremities: No cyanosis or edema.  Pertinent labs/imaging Last CBC Lab Results  Component Value Date   WBC 14.0 (H) 11/30/2023   HGB 14.5 11/30/2023   HCT 44.9 11/30/2023   MCV 85.5 11/30/2023   MCH 27.6 11/30/2023   RDW 16.4 (H) 11/30/2023   PLT 234 11/30/2023   Last metabolic panel Lab Results  Component Value Date   GLUCOSE 116 (H) 11/30/2023   NA 135 11/30/2023   K 4.5 11/30/2023   CL 98 11/30/2023   CO2 27 11/30/2023   BUN 18 11/30/2023   CREATININE 1.48 (H) 11/30/2023   GFRNONAA 49 (L) 11/30/2023   CALCIUM  10.1 11/30/2023   PHOS 2.9 07/29/2016   PROT 7.5 11/30/2023   ALBUMIN 4.7 11/30/2023   LABGLOB 2.4 10/29/2021   AGRATIO 2.0 10/29/2021   BILITOT 1.3 (  H) 11/30/2023   ALKPHOS 96 11/30/2023   AST 18 11/30/2023   ALT 12 11/30/2023   ANIONGAP 11 11/30/2023   Last hemoglobin A1c Lab Results  Component Value Date   HGBA1C 5.6 01/26/2023   HGBA1C 5.6 01/26/2023   HGBA1C 5.6 (A) 01/26/2023   HGBA1C 5.6 01/26/2023   ASSESSMENT/PLAN:  #1 acute on chronic hypoxic respiratory failure due to  exacerbation of interstitial lung disease. Significantly improving with systemic steroid regimen, plan to continue taper and follow-up with pulmonary 12/26/2023.  He has already restarted pulmonary rehab this week. It was felt that he may have had a component of acute on chronic diastolic heart failure so he will take an extra 20 mg a day of Lasix  for the next several weeks. Check metabolic panel today.  2 diabetes, historically well-controlled on metformin  500 mg twice daily. He did have steroid-induced hyperglycemia this summer in the hospital and during this recent hospitalization.  He did get sliding scale insulin  in the hospital. He will continue metformin  500 mg twice daily. Check hemoglobin A1c today.  #3 chronic renal insufficiency stage IIIa. He did have a slight AKA and mild hyperkalemia upon admission but potassium and serum creatinine returned to baseline prior to discharge. Checking metabolic panel today.  FOLLOW UP: 3 months  Signed:  Gerlene Hockey, MD           12/15/2023

## 2023-12-16 LAB — HEMOGLOBIN A1C
Hgb A1c MFr Bld: 6.6 % — ABNORMAL HIGH (ref <5.7)
Mean Plasma Glucose: 143 mg/dL
eAG (mmol/L): 7.9 mmol/L

## 2023-12-16 LAB — BASIC METABOLIC PANEL WITH GFR
BUN/Creatinine Ratio: 17 (calc) (ref 6–22)
BUN: 29 mg/dL — ABNORMAL HIGH (ref 7–25)
CO2: 31 mmol/L (ref 20–32)
Calcium: 10.2 mg/dL (ref 8.6–10.3)
Chloride: 93 mmol/L — ABNORMAL LOW (ref 98–110)
Creat: 1.71 mg/dL — ABNORMAL HIGH (ref 0.70–1.28)
Glucose, Bld: 231 mg/dL — ABNORMAL HIGH (ref 65–99)
Potassium: 5 mmol/L (ref 3.5–5.3)
Sodium: 135 mmol/L (ref 135–146)
eGFR: 41 mL/min/1.73m2 — ABNORMAL LOW (ref >=60)

## 2023-12-18 ENCOUNTER — Other Ambulatory Visit: Payer: Self-pay

## 2023-12-18 ENCOUNTER — Encounter (HOSPITAL_COMMUNITY)

## 2023-12-19 ENCOUNTER — Encounter (HOSPITAL_COMMUNITY)
Admission: RE | Admit: 2023-12-19 | Discharge: 2023-12-19 | Disposition: A | Source: Ambulatory Visit | Attending: Pulmonary Disease

## 2023-12-19 VITALS — Wt 209.2 lb

## 2023-12-19 DIAGNOSIS — J84111 Idiopathic interstitial pneumonia, not otherwise specified: Secondary | ICD-10-CM

## 2023-12-19 NOTE — Progress Notes (Signed)
 Daily Session Note  Patient Details  Name: William [name] MRN: [id number] Date of Birth: Oct 28, 1950 Referring Provider:   Conrad Ports Pulmonary Rehab Walk Test from 11/20/2023 in [facility] for Heart, Vascular, & Lung Health  Referring Provider Dr. Almond Cone    Encounter Date: 12/19/2023  Check In:  Session Check In - 12/19/23 1106       Check-In   Supervising physician immediately available to respond to emergencies [facility] MD immediately available    Physician(s) Barnie Press, NP    Location [facility]    Staff Present Augustin Sharps, Neita Moats, MS, ACSM-CEP, Exercise Physiologist;Johnny Fayette, MS, Exercise Physiologist;Mary Harvy, RN, BSN;Randi Reeve BS, ACSM-CEP, Exercise Physiologist    Virtual Visit No    Medication changes reported     No    Fall or balance concerns reported    No    Tobacco Cessation No Change    Warm-up and Cool-down Performed as group-led instruction    Resistance Training Performed Yes    VAD Patient? No    PAD/SET Patient? No      Pain Assessment   Currently in Pain? No/denies    Multiple Pain Sites No          Capillary Blood Glucose: No results found. However, due to the size of the patient record, not all encounters were searched. Please check Results Review for a complete set of results.   Exercise Prescription Changes - 12/19/23 1100       Response to Exercise   Blood Pressure (Admit) 140/70    Blood Pressure (Exercise) 130/70    Blood Pressure (Exit) 116/64    Heart Rate (Admit) 71 bpm    Heart Rate (Exercise) 74 bpm    Heart Rate (Exit) 75 bpm    Oxygen  Saturation (Admit) 99 %    Oxygen  Saturation (Exercise) 97 %    Oxygen  Saturation (Exit) 97 %    Rating of Perceived Exertion (Exercise) 13    Perceived Dyspnea (Exercise) 2    Duration Continue with 30 min of aerobic exercise without signs/symptoms of physical distress.    Intensity THRR unchanged      Progression    Progression Continue to progress workloads to maintain intensity without signs/symptoms of physical distress.      Resistance Training   Training Prescription Yes    Weight blue bands    Reps 10-15    Time 10 Minutes      Interval Training   Interval Training No      Oxygen    Oxygen  Continuous    Liters 15   NRB     NuStep   Level 1    SPM 84    Minutes 30    METs 1.6      Oxygen    Maintain Oxygen  Saturation 88% or higher          Social History   Tobacco Use  Smoking Status Former   Current packs/day: 0.00   Average packs/day: 0.5 packs/day for 17.0 years (8.5 ttl pk-yrs)   Types: Cigarettes   Start date: 65   Quit date: 73   Years since quitting: 42.8  Smokeless Tobacco Never  Tobacco Comments   QUIT I 1983    Goals Met:  Proper associated with RPD/PD & O2 Sat Exercise tolerated well No report of concerns or symptoms today Strength training completed today  Goals Unmet:  Not Applicable  Comments: Service time is from 1011 to 11135.  Dr. Slater Staff is Medical Director for Pulmonary Rehab at [facility].

## 2023-12-20 ENCOUNTER — Other Ambulatory Visit: Payer: Self-pay

## 2023-12-20 ENCOUNTER — Other Ambulatory Visit (HOSPITAL_COMMUNITY): Payer: Self-pay

## 2023-12-20 ENCOUNTER — Encounter (HOSPITAL_COMMUNITY)

## 2023-12-20 NOTE — Progress Notes (Signed)
 Specialty Pharmacy Refill Coordination Note  MyChart Questionnaire Submission  William [name] is a 73 y.o. male contacted today regarding refills of specialty medication(s) Prograf .  Doses on hand: 6 (18 caps)  Patient requested: Pickup at [facility] date: 12/20/23  Medication will be filled on 12/20/23.

## 2023-12-21 ENCOUNTER — Ambulatory Visit: Payer: Self-pay | Admitting: Family Medicine

## 2023-12-21 ENCOUNTER — Encounter (HOSPITAL_COMMUNITY)
Admission: RE | Admit: 2023-12-21 | Discharge: 2023-12-21 | Disposition: A | Discharge status: Home / Self Care | Source: Ambulatory Visit | Attending: Pulmonary Disease | Admitting: Pulmonary Disease

## 2023-12-21 DIAGNOSIS — N2889 Other specified disorders of kidney and ureter: Secondary | ICD-10-CM

## 2023-12-21 DIAGNOSIS — J84111 Idiopathic interstitial pneumonia, not otherwise specified: Secondary | ICD-10-CM

## 2023-12-21 DIAGNOSIS — N179 Acute kidney failure, unspecified: Secondary | ICD-10-CM

## 2023-12-21 NOTE — Progress Notes (Signed)
 Daily Session Note  Patient Details  Name: William [name] MRN: [id number] Date of Birth: 1950/04/04 Referring Provider:   Conrad Ports Pulmonary Rehab Walk Test from 11/20/2023 in [facility] for Heart, Vascular, & Lung Health  Referring Provider Dr. Almond Cone    Encounter Date: 12/21/2023  Check In:  Session Check In - 12/21/23 1133       Check-In   Supervising physician immediately available to respond to emergencies [facility] MD immediately available    Physician(s) Josefa Beauvais, NP    Location [facility]    Staff Present Augustin Sharps, Neita Moats, MS, ACSM-CEP, Exercise Physiologist;Mary Harvy, RN, BSN;Randi Reeve BS, ACSM-CEP, Exercise Physiologist    Virtual Visit No    Medication changes reported     No    Fall or balance concerns reported    No    Tobacco Cessation No Change    Warm-up and Cool-down Performed as group-led instruction    Resistance Training Performed Yes    VAD Patient? No    PAD/SET Patient? No      Pain Assessment   Currently in Pain? No/denies    Multiple Pain Sites No          Capillary Blood Glucose: No results found. However, due to the size of the patient record, not all encounters were searched. Please check Results Review for a complete set of results.    Social History   Tobacco Use  Smoking Status Former   Current packs/day: 0.00   Average packs/day: 0.5 packs/day for 17.0 years (8.5 ttl pk-yrs)   Types: Cigarettes   Start date: 69   Quit date: 46   Years since quitting: 42.8  Smokeless Tobacco Never  Tobacco Comments   QUIT I 1983    Goals Met:  Proper associated with RPD/PD & O2 Sat Independence with exercise equipment Exercise tolerated well No report of concerns or symptoms today Strength training completed today  Goals Unmet:  Not Applicable  Comments: Service time is from 1010 to 1143.    Dr. Slater Staff is Medical Director for Pulmonary Rehab at [facility].

## 2023-12-22 ENCOUNTER — Encounter (HOSPITAL_COMMUNITY)

## 2023-12-25 ENCOUNTER — Encounter (HOSPITAL_COMMUNITY)

## 2023-12-25 ENCOUNTER — Other Ambulatory Visit (INDEPENDENT_AMBULATORY_CARE_PROVIDER_SITE_OTHER)

## 2023-12-25 ENCOUNTER — Ambulatory Visit: Payer: Self-pay | Admitting: Family Medicine

## 2023-12-25 DIAGNOSIS — N179 Acute kidney failure, unspecified: Secondary | ICD-10-CM | POA: Diagnosis not present

## 2023-12-25 DIAGNOSIS — N2889 Other specified disorders of kidney and ureter: Secondary | ICD-10-CM | POA: Diagnosis not present

## 2023-12-25 LAB — BASIC METABOLIC PANEL WITH GFR
BUN: 16 mg/dL (ref 6–23)
CO2: 29 meq/L (ref 19–32)
Calcium: 9.2 mg/dL (ref 8.4–10.5)
Chloride: 98 meq/L (ref 96–112)
Creatinine, Ser: 1.2 mg/dL (ref 0.40–1.50)
GFR: 59.67 mL/min — ABNORMAL LOW (ref >60.00)
Glucose, Bld: 126 mg/dL — ABNORMAL HIGH (ref 70–99)
Potassium: 4.4 meq/L (ref 3.5–5.1)
Sodium: 140 meq/L (ref 135–145)

## 2023-12-26 ENCOUNTER — Other Ambulatory Visit (HOSPITAL_COMMUNITY): Payer: Self-pay

## 2023-12-26 ENCOUNTER — Encounter (HOSPITAL_COMMUNITY)

## 2023-12-26 MED ORDER — PREDNISONE 10 MG PO TABS
ORAL_TABLET | ORAL | 0 refills | Status: DC
  Filled 2023-12-26: qty 58, 81d supply, fill #0

## 2023-12-27 ENCOUNTER — Encounter (HOSPITAL_COMMUNITY)

## 2023-12-28 ENCOUNTER — Encounter (HOSPITAL_COMMUNITY)
Admission: RE | Admit: 2023-12-28 | Discharge: 2023-12-28 | Disposition: A | Source: Ambulatory Visit | Attending: Pulmonary Disease

## 2023-12-28 DIAGNOSIS — J84111 Idiopathic interstitial pneumonia, not otherwise specified: Secondary | ICD-10-CM

## 2023-12-28 NOTE — Progress Notes (Signed)
 Daily Session Note  Patient Details  Name: William [name] MRN: [id number] Date of Birth: 08-17-1950 Referring Provider:   [facility] Walk Test from 11/20/2023 in [facility]  Referring Provider Dr. Almond Cone    Encounter Date: 12/28/2023  Check In:  Session Check In - 12/28/23 1105       Check-In   Supervising physician immediately available to respond to emergencies [facility] MD immediately available    Physician(s) Orren Fabry, NP    Location [facility]    Staff Present Augustin Sharps, Neita Moats, MS, ACSM-CEP, Exercise Physiologist;Johnavon Mcclafferty Harvy, RN, BSN;Randi Reeve BS, ACSM-CEP, Exercise Physiologist    Virtual Visit No    Medication changes reported     No    Fall or balance concerns reported    No    Tobacco Cessation No Change    Warm-up and Cool-down Performed as group-led instruction    Resistance Training Performed Yes    VAD Patient? No    PAD/SET Patient? No      Pain Assessment   Currently in Pain? No/denies    Multiple Pain Sites No          Capillary Blood Glucose: No results found. However, due to the size of the patient record, not all encounters were searched. Please check Results Review for a complete set of results.    Social History   Tobacco Use  Smoking Status Former   Current packs/day: 0.00   Average packs/day: 0.5 packs/day for 17.0 years (8.5 ttl pk-yrs)   Types: Cigarettes   Start date: 27   Quit date: 88   Years since quitting: 42.9  Smokeless Tobacco Never  Tobacco Comments   QUIT I 1983    Goals Met:  Independence with exercise equipment Exercise tolerated well Queuing for purse lip breathing No report of concerns or symptoms today Strength training completed today  Goals Unmet:  O2 Sat, desaturated on 15LNRB, applied HFNC with NRB and O2 WNL  Comments: Service time is from 1018 to 1136    Dr. Slater Staff is Medical  Director for Pulmonary Rehab at [facility].

## 2023-12-29 ENCOUNTER — Encounter (HOSPITAL_COMMUNITY)

## 2024-01-01 ENCOUNTER — Other Ambulatory Visit: Payer: Self-pay

## 2024-01-01 ENCOUNTER — Other Ambulatory Visit (HOSPITAL_COMMUNITY): Payer: Self-pay

## 2024-01-01 ENCOUNTER — Encounter (HOSPITAL_COMMUNITY)

## 2024-01-01 ENCOUNTER — Other Ambulatory Visit: Payer: Self-pay | Admitting: Family Medicine

## 2024-01-01 MED ORDER — METOPROLOL TARTRATE 100 MG PO TABS
100.0000 mg | ORAL_TABLET | Freq: Two times a day (BID) | ORAL | 1 refills | Status: DC
  Filled 2024-01-01: qty 180, 90d supply, fill #0

## 2024-01-02 ENCOUNTER — Encounter: Payer: Self-pay | Admitting: Cardiovascular Disease

## 2024-01-02 ENCOUNTER — Encounter (HOSPITAL_COMMUNITY)
Admission: RE | Admit: 2024-01-02 | Discharge: 2024-01-02 | Disposition: A | Source: Ambulatory Visit | Attending: Pulmonary Disease

## 2024-01-02 VITALS — Wt 208.3 lb

## 2024-01-02 DIAGNOSIS — J84111 Idiopathic interstitial pneumonia, not otherwise specified: Secondary | ICD-10-CM | POA: Diagnosis not present

## 2024-01-02 NOTE — Progress Notes (Signed)
 Daily Session Note  Patient Details  Name: William [name] MRN: [id number] Date of Birth: 16-Aug-1950 Referring Provider:   [facility] Walk Test from 11/20/2023 in [facility]  Referring Provider Dr. Almond Cone    Encounter Date: 01/02/2024  Check In:  Session Check In - 01/02/24 1129       Check-In   Supervising physician immediately available to respond to emergencies [facility] MD immediately available    Physician(s) Rosabel Mose, NP    Location [facility]    Staff Present Augustin Sharps, Neita Moats, MS, ACSM-CEP, Exercise Physiologist;Erie Radu Harvy, RN, BSN;Randi Reeve BS, ACSM-CEP, Exercise Physiologist    Virtual Visit No    Medication changes reported     No    Fall or balance concerns reported    No    Tobacco Cessation No Change    Warm-up and Cool-down Performed as group-led instruction    Resistance Training Performed Yes    VAD Patient? No    PAD/SET Patient? No      Pain Assessment   Currently in Pain? No/denies    Multiple Pain Sites No          Capillary Blood Glucose: No results found. However, due to the size of the patient record, not all encounters were searched. Please check Results Review for a complete set of results.   Exercise Prescription Changes - 01/02/24 1200       Response to Exercise   Blood Pressure (Admit) 132/81    Blood Pressure (Exercise) 130/64    Blood Pressure (Exit) 118/66    Heart Rate (Admit) 70 bpm    Heart Rate (Exercise) 74 bpm    Heart Rate (Exit) 69 bpm    Oxygen  Saturation (Admit) 100 %   15L HFNC   Oxygen  Saturation (Exercise) 91 %   15L NRB   Oxygen  Saturation (Exit) 100 %   15L NRB   Rating of Perceived Exertion (Exercise) 12    Perceived Dyspnea (Exercise) 2    Duration Continue with 30 min of aerobic exercise without signs/symptoms of physical distress.    Intensity THRR unchanged      Progression   Progression  Continue to progress workloads to maintain intensity without signs/symptoms of physical distress.      Resistance Training   Training Prescription Yes    Weight blue bands    Reps 10-15    Time 10 Minutes      Interval Training   Interval Training No      Oxygen    Oxygen  Continuous    Liters 15   HFNC or NRB mask     NuStep   Level 2    SPM 82    Minutes 15    METs 1.8      Oxygen    Maintain Oxygen  Saturation 88% or higher          Social History   Tobacco Use  Smoking Status Former   Current packs/day: 0.00   Average packs/day: 0.5 packs/day for 17.0 years (8.5 ttl pk-yrs)   Types: Cigarettes   Start date: 68   Quit date: 84   Years since quitting: 42.9  Smokeless Tobacco Never  Tobacco Comments   QUIT I 1983    Goals Met:  Independence with exercise equipment Exercise tolerated well Queuing for purse lip breathing No report of concerns or symptoms today Strength training completed today  Goals Unmet:  Not  Applicable  Comments: Service time is from 1013 to 1142    Dr. Slater Staff is Medical Director for Pulmonary Rehab at [employer]

## 2024-01-03 NOTE — Progress Notes (Signed)
 Pulmonary Individual Treatment Plan  Patient Details  Name: William [name] MRN: [id number] Date of Birth: November 25, 1950 Referring Provider:   Conrad Ports Pulmonary Rehab Walk Test from 11/20/2023 in [facility]  Referring Provider Dr. 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)  Patient's Home Medications on Admission:   Current Outpatient Medications:    acetaminophen  (TYLENOL ) 500 MG tablet, Take 1,000 mg by mouth as needed., Disp: , Rfl:    acitretin  (SORIATANE ) 10 MG capsule, Take 1 capsule by mouth daily, Disp: 30 capsule, Rfl: 5   amLODipine  (NORVASC ) 10 MG tablet, Take 1 tablet (10 mg total) by mouth daily., Disp: 90 tablet, Rfl: 3   ammonium lactate  (AMLACTIN) 12 % cream, Apply 2 times daily to forearms and hands in preparation for treatment with Efudex  (fluorouracil ) (Patient taking differently: Apply 1 Application topically as needed for dry skin.), Disp: 385 g, Rfl: 99   amoxicillin  (AMOXIL ) 500 MG capsule, Prophylaxis for dental procedure. Take 2 grams (4x 500mg  tablets) 1 hour before procedure., Disp: 4 capsule, Rfl: 0   aspirin EC 81 MG tablet, Take 81 mg by mouth daily. Swallow whole., Disp: , Rfl:    furosemide  (LASIX ) 20 MG tablet, Take 2 tablets (40 mg total) by mouth once daily, Disp: 60 tablet, Rfl: 0   metFORMIN  (GLUCOPHAGE ) 500 MG tablet, Take 1 tablet (500 mg total) by mouth 2 (two) times daily with a meal., Disp: 180 tablet, Rfl: 3   metoprolol  tartrate (LOPRESSOR ) 100 MG tablet, Take 1 tablet (100 mg total) by mouth 2 (two) times daily., Disp: 180 tablet, Rfl: 1   mycophenolate  (CELLCEPT ) 250 MG capsule, Take 2 capsules by mouth in the morning and 1 capsule in the evening., Disp: 270  capsule, Rfl: 3   pantoprazole  (PROTONIX ) 40 MG tablet, Take 1 tablet (40 mg total) by mouth daily., Disp: 90 tablet, Rfl: 3   predniSONE  (DELTASONE ) 10 MG tablet, Take 2 tablets (20 mg total) daily for 7 days, THEN 1 tablet (10 mg total) daily for 14 days, THEN 0.5 tablets (5 mg total) daily., Disp: 58 tablet, Rfl: 0   rosuvastatin  (CRESTOR ) 10 MG tablet, Take 1 tablet (10 mg total) by mouth daily., Disp: 90 tablet, Rfl: 3   tacrolimus  (PROGRAF ) 1 MG capsule, Take 2 capsules (2 mg total) by mouth every morning AND 1 capsule (1 mg total) every evening., Disp: 270 capsule, Rfl: 3   triamcinolone  cream (KENALOG ) 0.1 %, Apply 2 times a day to rash. Follow with CeraVe Cream.  Stop when smooth. (Patient not taking: Reported on 12/15/2023), Disp: 80 g, Rfl: 2 No current facility-administered medications for this encounter.  Facility-Administered Medications Ordered in Other Encounters:    heparin  lock flush 100 unit/mL, 500 Units, Intracatheter, Daily PRN, Lanny Callander, MD   sodium chloride  flush (NS) 0.9 % injection 10 mL, 10 mL, Intracatheter, PRN, Lanny Callander, MD  Past Medical History: Past Medical History:  Diagnosis Date   AAA (abdominal aortic aneurysm) 03/2014   repaired 2020   Ascending aortic aneurysm    unrepaired. Stable at 4.3 cm on imaging by [facility] thoracic surg 10/2019 and 10/2020.   Bilateral renal cysts    simple (03/2014 MRI)   CAD (coronary artery disease)  Cholelithiases 2018   asymptomatic   Chronic diastolic heart failure (HCC)    Chronic renal insufficiency, stage 3 (moderate) 2022   baseline sCr 1.3-1.5 (GFR 55)   COVID-19 virus infection 02/2020   sotrovimab  infusion   Diabetes mellitus with complication (HCC) 07/2014   A1c 6.8%. A1c 5.4% 11/2019   Diverticulosis    Gout    initially dx'd by diag arthrocentesis of elbow effusion in winter 2021, 3 episodes, all came after being started on chlorthalidone ->I d/c'd chlorthal.   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   Improving + off oxygen  as of 3 mo s/p liver transplant.  Doing well/resolved as of 02/2020 [facility] pulm f/u->they'll continue to follow. No worsening on CT imaging at [facility] 03/2023   History of blood transfusion 2018 X 4 dates   Chronic GI blood loss of unknown location despite full GI endoscopic eval, etc   History of liver transplant (HCC) 07/2019   [facility]   Hyperkalemia    Lokelma    Hyperlipemia, mixed    elevated LFTs when on statins.     Hypertension    Cr bump 04/01/16 so I changed benicar -hct to benicar  plain and added amlodipine  5 mg.   Increased prostate specific antigen (PSA) velocity 2021   0.11 to 4.77 from 2020 to 2021 (he got a liver transplant and was put on antirejection meds between these psa checks).   Iron  deficiency anemia 05/2016   Acute blood loss anemia: hospitalized, required transfusion x 3 U: colonoscopy and capsule study unrevealing.  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  W/u for ? hemolytic anemia to be pursued by hematologist as outpt.  Dr. Charlean, GI at [facility] following, too---he rec'd onc do bone marrow bx as of Jan 2019   Iron  deficiency anemia due to chronic blood loss 2018/19   GI: transfusions x >20 required; multiple endoscopies and bleeding scans unrevealing. Lysteda  and octreotide  + monthly iron  infusions as of 04/2017. Iron  infusions changed to every other month as of 09/2018 hem f/u.   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  Changes noted on all CXRs in 2018/2019.  Liver Transplant eval 03/2018->mild restriction on PFTs but no obstruction.  See further details in PMH  pulm fibrosis section   Microscopic hematuria    Eval unremarkable by Dr. Maryanne.   Nonmelanoma skin cancer 08/15/2018   2020 BCC nose, excised.  2022 R side of neck->mohs   Obesity    Pulmonary fibrosis (HCC)    PFTs: restrictive lung dz ([facility] Liver transplant eval 05/2018). [facility] pulm eval felt this was likely hepatopulmonary syndrome->causing his hypoxia->low likelihood of progression (as of 10/04/18 transplant clinic f/u). Pulm rehab helping as of 2020/2021. OFF oxygen  as of 10/2019 transp f/u.   Pulmonary fibrosis (HCC)    Gradual worsening of objective testing 2020 05/2023, has had return of oxygen  requirement, diffuse parenchymal lung disease of unclear etiology.   Spleen enlarged     Tobacco Use: Social History   Tobacco Use  Smoking Status Former   Current packs/day: 0.00   Average packs/day: 0.5 packs/day for  17.0 years (8.5 ttl pk-yrs)   Types: Cigarettes   Start date: 6   Quit date: 68   Years since quitting: 42.9  Smokeless Tobacco Never  Tobacco Comments   QUIT I 1983    Labs: Review Flowsheet  More data exists      Latest Ref Rng & Units 04/20/2022 07/21/2022 10/24/2022 01/26/2023 12/15/2023  Labs for ITP Cardiac and Pulmonary Rehab  Cholestrol 0 - 200 mg/dL - 94  - 90  -  LDL (calc) 0 - 99 mg/dL - 41  - 40  -  HDL-C >60.99 mg/dL - 74.09  - 75.29  -  Trlycerides 0.0 - 149.0 mg/dL - 863.9  - 871.9  -  Hemoglobin A1c <5.7 % 7.8  7.8  7.8  7.8  5.5  5.5  5.5  5.5  5.7  5.7  5.7  5.7  5.6  5.6  5.6  5.6  6.6     Details       Multiple values from one day are sorted in reverse-chronological order         Capillary Blood Glucose: Lab Results  Component Value Date   GLUCAP 194 (H) 12/12/2023   GLUCAP 206 (H) 12/12/2023   GLUCAP 146 (H) 11/30/2023   GLUCAP 145 (H) 11/30/2023   GLUCAP 139 (H) 11/28/2023     Pulmonary Assessment Scores:  Pulmonary Assessment Scores     Row Name 11/20/23 0959         ADL UCSD   ADL Phase Entry     SOB Score  total 88       CAT Score   CAT Score 18       mMRC Score   mMRC Score 4       UCSD: Self-administered rating of dyspnea associated with activities of daily living (ADLs) 6-point scale (0 = not at all to 5 = maximal or unable to do because of breathlessness)  Scoring Scores range from 0 to 120.  Minimally important difference is 5 units  CAT: CAT can identify the health impairment of COPD patients and is better correlated with disease progression.  CAT has a scoring range of zero to 40. The CAT score is classified into four groups of low (less than 10), medium (10 - 20), high (21-30) and very high (31-40) based on the impact level of disease on health status. A CAT score over 10 suggests significant symptoms.  A worsening CAT score could be explained by an exacerbation, poor medication adherence, poor inhaler technique, or progression of COPD or comorbid conditions.  CAT MCID is 2 points  mMRC: mMRC (Modified Medical Research Council) Dyspnea Scale is used to assess the degree of baseline functional disability in patients of respiratory disease due to dyspnea. No minimal important difference is established. A decrease in score of 1 point or greater is considered a positive change.   Pulmonary Function Assessment:  Pulmonary Function Assessment - 11/20/23 0921       Breath   Bilateral Breath Sounds Clear    Shortness of Breath Yes;Limiting activity          Exercise Target Goals: Exercise Program Goal: Individual exercise prescription set using results from initial 6 min walk test and THRR while considering  patient's activity barriers and safety.   Exercise Prescription Goal: Initial exercise prescription builds to 30-45 minutes a day of aerobic activity, 2-3 days per week.  Home exercise guidelines will be given to patient during program as part of exercise prescription  that the participant will acknowledge.  Activity Barriers & Risk Stratification:  Activity Barriers &  Cardiac Risk Stratification - 10/05/23 1534       Activity Barriers & Cardiac Risk Stratification   Activity Barriers Balance Concerns;Joint Problems;Arthritis;Deconditioning;Shortness of Breath;Neck/Spine Problems    Cardiac Risk Stratification High          6 Minute Walk:  6 Minute Walk     Row Name 08/29/23 1619 10/05/23 0915 11/20/23 1009     6 Minute Walk   Phase Initial  Used Go- Cart and O2 Initial Initial   Distance 330 feet 766 feet 625 feet   Walk Time 6 minutes 6 minutes 6 minutes   # of Rest Breaks 1  From 2:01 to 6:00 1  3:10-4:13 to increase O2 to 15L and get SaO2 to 90% 2  2:32, 5:40 increase O2 and NRB   MPH 0.63 1.5 1.18   METS 0.6 1.54 1.08   RPE 15 14 17    Perceived Dyspnea  3 3 4    VO2 Peak 2.2 5.4 3.77   Symptoms Yes (comment) Yes (comment) No   Comments Moderate to severe SOB, RPD = 3 Moderate to severe SOB, RPD = 3, resolved with rest --   Resting HR 66 bpm 61 bpm 67 bpm   Resting BP 110/64 138/72 112/62   Resting Oxygen  Saturation  95 %  4L O2 96 %  8L O2 94 %   Exercise Oxygen  Saturation  during 6 min walk 82 % 88 % 86 %   Max Ex. HR 80 bpm 83 bpm 77 bpm   Max Ex. BP 128/80 150/80 132/70   2 Minute Post BP 118/68 124/70 122/70     Interval HR   1 Minute HR 80 75 73   2 Minute HR 79 81 76   3 Minute HR 72 86 72   4 Minute HR 70 78 74   5 Minute HR 66 76 77   6 Minute HR 64 79 66   2 Minute Post HR 64 65 70   Interval Heart Rate? Yes Yes Yes     Interval Oxygen    Interval Oxygen ? Yes Yes Yes   Baseline Oxygen  Saturation % 95 % 96 %  8L 94 %   1 Minute Oxygen  Saturation % 95 % 96 % 95 %   1 Minute Liters of Oxygen  8 L 10 L 15 L   2 Minute Oxygen  Saturation % 83 %  Stopped walk test 91 % 90 %   2 Minute Liters of Oxygen  8 L 10 L 15 L  NRB   3 Minute Oxygen  Saturation % 82 %  At rest 88 % 93 %   3 Minute Liters of Oxygen  8 L 10 L 15 L  NRB   4 Minute Oxygen  Saturation % 86 %  AT rest 90 % 96 %   4 Minute Liters of Oxygen  8 L 15 L 15 L  NRB    5 Minute Oxygen  Saturation % 91 % 92 % 90 %  86% at 5:40   5 Minute Liters of Oxygen  8 L 15 L 15 L  increased to 25 NRB   6 Minute Oxygen  Saturation % 95 % 93 % 95 %   6 Minute Liters of Oxygen  8 L 15 L 25 L  NRB   2 Minute Post Oxygen  Saturation % 97 % 99 % 91 %   2 Minute Post Liters of Oxygen  8 L 8 L 10  L  NRB      Oxygen  Initial Assessment:  Oxygen  Initial Assessment - 11/20/23 0920       Home Oxygen    Home Oxygen  Device E-Tanks;Home Concentrator    Sleep Oxygen  Prescription Continuous    Liters per minute 5    Home Exercise Oxygen  Prescription Continuous    Liters per minute 15    Home Resting Oxygen  Prescription Continuous    Liters per minute 10    Compliance with Home Oxygen  Use Yes      Initial 6 min Walk   Oxygen  Used Continuous    Liters per minute 15   NRB     Program Oxygen  Prescription   Program Oxygen  Prescription Continuous    Liters per minute 15   NRB mask     Intervention   Short Term Goals To learn and exhibit compliance with exercise, home and travel O2 prescription;To learn and understand importance of monitoring SPO2 with pulse oximeter and demonstrate accurate use of the pulse oximeter.;To learn and understand importance of maintaining oxygen  saturations>88%;To learn and demonstrate proper pursed lip breathing techniques or other breathing techniques. ;To learn and demonstrate proper use of respiratory medications    Long  Term Goals Exhibits compliance with exercise, home  and travel O2 prescription;Verbalizes importance of monitoring SPO2 with pulse oximeter and return demonstration;Maintenance of O2 saturations>88%;Exhibits proper breathing techniques, such as pursed lip breathing or other method taught during program session;Compliance with respiratory medication;Demonstrates proper use of MDI's          Oxygen  Re-Evaluation:  Oxygen  Re-Evaluation     Row Name 11/30/23 0843 12/29/23 1056           Program Oxygen  Prescription   Program  Oxygen  Prescription Continuous Continuous      Liters per minute 15  NRB mask 15  NRB mask        Home Oxygen    Home Oxygen  Device E-Tanks;Home Concentrator E-Tanks;Home Concentrator      Sleep Oxygen  Prescription Continuous Continuous      Liters per minute 5 5      Home Exercise Oxygen  Prescription Continuous Continuous      Liters per minute 15 15      Home Resting Oxygen  Prescription Continuous Continuous      Liters per minute 10 10      Compliance with Home Oxygen  Use Yes Yes        Goals/Expected Outcomes   Short Term Goals To learn and exhibit compliance with exercise, home and travel O2 prescription;To learn and understand importance of monitoring SPO2 with pulse oximeter and demonstrate accurate use of the pulse oximeter.;To learn and understand importance of maintaining oxygen  saturations>88%;To learn and demonstrate proper pursed lip breathing techniques or other breathing techniques. ;To learn and demonstrate proper use of respiratory medications To learn and exhibit compliance with exercise, home and travel O2 prescription;To learn and understand importance of monitoring SPO2 with pulse oximeter and demonstrate accurate use of the pulse oximeter.;To learn and understand importance of maintaining oxygen  saturations>88%;To learn and demonstrate proper pursed lip breathing techniques or other breathing techniques. ;To learn and demonstrate proper use of respiratory medications      Long  Term Goals Exhibits compliance with exercise, home  and travel O2 prescription;Verbalizes importance of monitoring SPO2 with pulse oximeter and return demonstration;Maintenance of O2 saturations>88%;Exhibits proper breathing techniques, such as pursed lip breathing or other method taught during program session;Compliance with respiratory medication;Demonstrates proper use of MDI's Exhibits compliance with exercise, home  and travel O2 prescription;Verbalizes importance of monitoring SPO2 with pulse oximeter  and return demonstration;Maintenance of O2 saturations>88%;Exhibits proper breathing techniques, such as pursed lip breathing or other method taught during program session;Compliance with respiratory medication;Demonstrates proper use of MDI's      Goals/Expected Outcomes Compliance and understanding of oxygen  saturation monitoring and breathing techniques to decrease shortness of breath. Compliance and understanding of oxygen  saturation monitoring and breathing techniques to decrease shortness of breath.         Oxygen  Discharge (Final Oxygen  Re-Evaluation):  Oxygen  Re-Evaluation - 12/29/23 1056       Program Oxygen  Prescription   Program Oxygen  Prescription Continuous    Liters per minute 15   NRB mask     Home Oxygen    Home Oxygen  Device E-Tanks;Home Concentrator    Sleep Oxygen  Prescription Continuous    Liters per minute 5    Home Exercise Oxygen  Prescription Continuous    Liters per minute 15    Home Resting Oxygen  Prescription Continuous    Liters per minute 10    Compliance with Home Oxygen  Use Yes      Goals/Expected Outcomes   Short Term Goals To learn and exhibit compliance with exercise, home and travel O2 prescription;To learn and understand importance of monitoring SPO2 with pulse oximeter and demonstrate accurate use of the pulse oximeter.;To learn and understand importance of maintaining oxygen  saturations>88%;To learn and demonstrate proper pursed lip breathing techniques or other breathing techniques. ;To learn and demonstrate proper use of respiratory medications    Long  Term Goals Exhibits compliance with exercise, home  and travel O2 prescription;Verbalizes importance of monitoring SPO2 with pulse oximeter and return demonstration;Maintenance of O2 saturations>88%;Exhibits proper breathing techniques, such as pursed lip breathing or other method taught during program session;Compliance with respiratory medication;Demonstrates proper use of MDI's    Goals/Expected  Outcomes Compliance and understanding of oxygen  saturation monitoring and breathing techniques to decrease shortness of breath.          Initial Exercise Prescription:  Initial Exercise Prescription - 11/20/23 1000       Date of Initial Exercise RX and Referring Provider   Date 11/20/23    Referring Provider Dr. Almond Cone    Expected Discharge Date 02/27/24      Oxygen    Oxygen  Continuous   NRB   Liters 15    Maintain Oxygen  Saturation 88% or higher      NuStep   Level 1    SPM 72    Minutes 30    METs 1.7      Prescription Details   Frequency (times per week) 2    Duration Progress to 30 minutes of continuous aerobic without signs/symptoms of physical distress      Intensity   THRR 40-80% of Max Heartrate 58-117    Ratings of Perceived Exertion 11-13    Perceived Dyspnea 0-4      Progression   Progression Continue progressive overload as per policy without signs/symptoms or physical distress.      Resistance Training   Training Prescription Yes    Weight blue bands    Reps 10-15          Perform Capillary Blood Glucose checks as needed.  Exercise Prescription Changes:   Exercise Prescription Changes     Row Name 10/16/23 1029 10/30/23 1029 11/30/23 1148 12/19/23 1100 01/02/24 1200     Response to Exercise   Blood Pressure (Admit) 128/70 106/72 128/70 140/70 132/81   Blood Pressure (Exercise) 138/68 -- --  130/70 130/64   Blood Pressure (Exit) 106/60 104/62 112/68 116/64 118/66   Heart Rate (Admit) 70 bpm 60 bpm 72 bpm 71 bpm 70 bpm   Heart Rate (Exercise) 85 bpm 74 bpm 76 bpm 74 bpm 74 bpm   Heart Rate (Exit) 73 bpm 65 bpm 64 bpm 75 bpm 69 bpm   Oxygen  Saturation (Admit) 98 % 95 % 97 % 99 % 100 %  15L HFNC   Oxygen  Saturation (Exercise) 95 % 96 % 94 % 97 % 91 %  15L NRB   Oxygen  Saturation (Exit) 99 % 100 % 100 % 97 % 100 %  15L NRB   Rating of Perceived Exertion (Exercise) 13 12 13 13 12    Perceived Dyspnea (Exercise) 0 0 2 2 2    Symptoms None  None -- -- --   Comments Off to a good start with exercise. Raevon completed cardiac rehab. -- -- --   Duration Continue with 30 min of aerobic exercise without signs/symptoms of physical distress. Continue with 30 min of aerobic exercise without signs/symptoms of physical distress. Continue with 30 min of aerobic exercise without signs/symptoms of physical distress. Continue with 30 min of aerobic exercise without signs/symptoms of physical distress. Continue with 30 min of aerobic exercise without signs/symptoms of physical distress.   Intensity THRR unchanged THRR unchanged THRR unchanged THRR unchanged THRR unchanged     Progression   Progression Continue to progress workloads to maintain intensity without signs/symptoms of physical distress. Continue to progress workloads to maintain intensity without signs/symptoms of physical distress. Continue to progress workloads to maintain intensity without signs/symptoms of physical distress. Continue to progress workloads to maintain intensity without signs/symptoms of physical distress. Continue to progress workloads to maintain intensity without signs/symptoms of physical distress.   Average METs 2.1 1.8 -- -- --     Resistance Training   Training Prescription Yes Yes Yes Yes Yes   Weight 2 lbs 2 lbs blue bands blue bands blue bands   Reps 10-15 10-15 10-15 10-15 10-15   Time 5 Minutes 5 Minutes 10 Minutes 10 Minutes 10 Minutes     Interval Training   Interval Training No No -- No No     Oxygen    Oxygen  Continuous Continuous Continuous Continuous Continuous   Liters 15 15 --  NRB+[id number] 15  NRB 15  HFNC or NRB mask     NuStep   Level 1 1 1 1 2    SPM 79 71 78 84 82   Minutes 30 30 30 30 15    METs 2.1 1.8 1.7 1.6 1.8     Home Exercise Plan   Plans to continue exercise at -- Home (comment)  Walking -- -- --   Frequency -- Add 3 additional days to program exercise sessions. -- -- --   Initial Home Exercises Provided -- 10/25/23 -- -- --      Oxygen    Maintain Oxygen  Saturation -- 88% or higher 88% or higher 88% or higher 88% or higher      Exercise Comments:   Exercise Comments     Row Name 10/16/23 1135 10/25/23 1111 10/30/23 1130 11/28/23 1205     Exercise Comments Timothee tolerated low intensity exercise well without symptoms. SaO2 maintained on 15 L O2. Reviewed home exercise guidelines and goals with Deaglan. Wilman completed cardiac rehab and will continue walking at home at this time. Pt completed first day of group exercise. He struggled keeping his SpO2 up and needed NRB and HF Menan  8L. He exercised on the recumbent stepper for 30 min, level 1, METs 1.5. He performed warm up and cool down intermittently sitting. Used red bands. Did not discuss METs today but will in the future.       Exercise Goals and Review:   Exercise Goals     Row Name 08/29/23 1625 10/05/23 1534 11/20/23 0922         Exercise Goals   Increase Physical Activity Yes Yes Yes     Intervention Provide advice, education, support and counseling about physical activity/exercise needs.;Develop an individualized exercise prescription for aerobic and resistive training based on initial evaluation findings, risk stratification, comorbidities and participant's personal goals. Provide advice, education, support and counseling about physical activity/exercise needs.;Develop an individualized exercise prescription for aerobic and resistive training based on initial evaluation findings, risk stratification, comorbidities and participant's personal goals. Provide advice, education, support and counseling about physical activity/exercise needs.;Develop an individualized exercise prescription for aerobic and resistive training based on initial evaluation findings, risk stratification, comorbidities and participant's personal goals.     Expected Outcomes Short Term: Attend rehab on a regular basis to increase amount of physical activity.;Long Term: Add in home exercise to  make exercise part of routine and to increase amount of physical activity.;Long Term: Exercising regularly at least 3-5 days a week. Short Term: Attend rehab on a regular basis to increase amount of physical activity.;Long Term: Add in home exercise to make exercise part of routine and to increase amount of physical activity.;Long Term: Exercising regularly at least 3-5 days a week. Short Term: Attend rehab on a regular basis to increase amount of physical activity.;Long Term: Add in home exercise to make exercise part of routine and to increase amount of physical activity.;Long Term: Exercising regularly at least 3-5 days a week.     Increase Strength and Stamina Yes -- Yes     Intervention Provide advice, education, support and counseling about physical activity/exercise needs.;Develop an individualized exercise prescription for aerobic and resistive training based on initial evaluation findings, risk stratification, comorbidities and participant's personal goals. Provide advice, education, support and counseling about physical activity/exercise needs.;Develop an individualized exercise prescription for aerobic and resistive training based on initial evaluation findings, risk stratification, comorbidities and participant's personal goals. Provide advice, education, support and counseling about physical activity/exercise needs.;Develop an individualized exercise prescription for aerobic and resistive training based on initial evaluation findings, risk stratification, comorbidities and participant's personal goals.     Expected Outcomes Short Term: Increase workloads from initial exercise prescription for resistance, speed, and METs.;Short Term: Perform resistance training exercises routinely during rehab and add in resistance training at home;Long Term: Improve cardiorespiratory fitness, muscular endurance and strength as measured by increased METs and functional capacity ( ) Short Term: Increase workloads  from initial exercise prescription for resistance, speed, and METs.;Short Term: Perform resistance training exercises routinely during rehab and add in resistance training at home;Long Term: Improve cardiorespiratory fitness, muscular endurance and strength as measured by increased METs and functional capacity ( ) Short Term: Increase workloads from initial exercise prescription for resistance, speed, and METs.;Short Term: Perform resistance training exercises routinely during rehab and add in resistance training at home;Long Term: Improve cardiorespiratory fitness, muscular endurance and strength as measured by increased METs and functional capacity ( )     Able to understand and use rate of perceived exertion (RPE) scale Yes Yes Yes     Intervention Provide education and explanation on how to use RPE scale Provide education and explanation on how to use  RPE scale Provide education and explanation on how to use RPE scale     Expected Outcomes Short Term: Able to use RPE daily in rehab to express subjective intensity level;Long Term:  Able to use RPE to guide intensity level when exercising independently Short Term: Able to use RPE daily in rehab to express subjective intensity level;Long Term:  Able to use RPE to guide intensity level when exercising independently Short Term: Able to use RPE daily in rehab to express subjective intensity level;Long Term:  Able to use RPE to guide intensity level when exercising independently     Able to understand and use Dyspnea scale Yes Yes Yes     Intervention Provide education and explanation on how to use Dyspnea scale Provide education and explanation on how to use Dyspnea scale Provide education and explanation on how to use Dyspnea scale     Expected Outcomes Short Term: Able to use Dyspnea scale daily in rehab to express subjective sense of shortness of breath during exertion;Long Term: Able to use Dyspnea scale to guide intensity level when exercising  independently Short Term: Able to use Dyspnea scale daily in rehab to express subjective sense of shortness of breath during exertion;Long Term: Able to use Dyspnea scale to guide intensity level when exercising independently Short Term: Able to use Dyspnea scale daily in rehab to express subjective sense of shortness of breath during exertion;Long Term: Able to use Dyspnea scale to guide intensity level when exercising independently     Knowledge and understanding of Target Heart Rate Range (THRR) Yes Yes Yes     Intervention Provide education and explanation of THRR including how the numbers were predicted and where they are located for reference Provide education and explanation of THRR including how the numbers were predicted and where they are located for reference Provide education and explanation of THRR including how the numbers were predicted and where they are located for reference     Expected Outcomes Short Term: Able to state/look up THRR;Long Term: Able to use THRR to govern intensity when exercising independently;Short Term: Able to use daily as guideline for intensity in rehab Short Term: Able to state/look up THRR;Long Term: Able to use THRR to govern intensity when exercising independently;Short Term: Able to use daily as guideline for intensity in rehab Short Term: Able to state/look up THRR;Long Term: Able to use THRR to govern intensity when exercising independently;Short Term: Able to use daily as guideline for intensity in rehab     Understanding of Exercise Prescription Yes Yes Yes     Intervention Provide education, explanation, and written materials on patient's individual exercise prescription Provide education, explanation, and written materials on patient's individual exercise prescription Provide education, explanation, and written materials on patient's individual exercise prescription     Expected Outcomes Short Term: Able to explain program exercise prescription;Long Term: Able  to explain home exercise prescription to exercise independently Short Term: Able to explain program exercise prescription;Long Term: Able to explain home exercise prescription to exercise independently Short Term: Able to explain program exercise prescription;Long Term: Able to explain home exercise prescription to exercise independently        Exercise Goals Re-Evaluation :  Exercise Goals Re-Evaluation     Row Name 10/16/23 1135 10/25/23 1111 10/30/23 1130 11/30/23 0841 12/29/23 1053     Exercise Goal Re-Evaluation   Exercise Goals Review Increase Physical Activity;Increase Strength and Stamina;Able to understand and use Dyspnea scale;Able to understand and use rate of perceived exertion (RPE) scale  Increase Physical Activity;Increase Strength and Stamina;Able to understand and use Dyspnea scale;Able to understand and use rate of perceived exertion (RPE) scale;Able to check pulse independently;Knowledge and understanding of Target Heart Rate Range (THRR);Understanding of Exercise Prescription Increase Physical Activity;Increase Strength and Stamina;Able to understand and use Dyspnea scale;Able to understand and use rate of perceived exertion (RPE) scale;Able to check pulse independently;Knowledge and understanding of Target Heart Rate Range (THRR);Understanding of Exercise Prescription Increase Physical Activity;Increase Strength and Stamina;Able to understand and use Dyspnea scale;Able to understand and use rate of perceived exertion (RPE) scale;Able to check pulse independently;Knowledge and understanding of Target Heart Rate Range (THRR);Understanding of Exercise Prescription Increase Physical Activity;Increase Strength and Stamina;Able to understand and use Dyspnea scale;Able to understand and use rate of perceived exertion (RPE) scale;Able to check pulse independently;Knowledge and understanding of Target Heart Rate Range (THRR);Understanding of Exercise Prescription   Comments Bush was able to  understand and use the RPE scale appropriately. He has a pulse oximeter to monitor his oxygen  saturation and pulse. Reviewed exercise prescription with Akin. Sid completed the cardiac rehab program and will continue walking at home. He plans to enroll in pulmonary rehab once his wife recovers from upcoming surgery. Juandiego has completed 1 exercise session. He exercises for 30 min on the Nustep. He averages 1.5 METs at level 1 on the Nustep. He performs the warmup and cooldown standing dependent on his shortness of breath/ fatigue. It is too soon to notate any discernable progressions. Will continue to monitor and progress as able. Acey has completed 7 exercise session. He exercises for 30 min on the Nustep. He averages 1.7 METs at level 2 on the Nustep. He performs the warmup and cooldown standing dependent on his shortness of breath/ fatigue. Ellington is very decondioned. He has also had a recent hospitalization. It has been difficult to progress Delaney due to this. Will continue to monitor and progress as able.   Expected Outcomes Rilan will exercise on telemetry monitor for 5 session, then start the pulmonary rehab program. Mahlik will walk 10-30 minutes, 2-3 days/week as tolerated. Thaniel will conitnuing walking 10-30 minutes, 2-3 days/week as tolerated. He will continue exercise in pulmonary rehab. Through exercise at rehab and home, the patient will decrease shortness of breath with daily activities and feel confident in carrying out an exercise regimen at home Through exercise at rehab and home, the patient will decrease shortness of breath with daily activities and feel confident in carrying out an exercise regimen at home      Discharge Exercise Prescription (Final Exercise Prescription Changes):  Exercise Prescription Changes - 01/02/24 1200       Response to Exercise   Blood Pressure (Admit) 132/81    Blood Pressure (Exercise) 130/64    Blood Pressure (Exit) 118/66    Heart Rate (Admit) 70 bpm     Heart Rate (Exercise) 74 bpm    Heart Rate (Exit) 69 bpm    Oxygen  Saturation (Admit) 100 %   15L HFNC   Oxygen  Saturation (Exercise) 91 %   15L NRB   Oxygen  Saturation (Exit) 100 %   15L NRB   Rating of Perceived Exertion (Exercise) 12    Perceived Dyspnea (Exercise) 2    Duration Continue with 30 min of aerobic exercise without signs/symptoms of physical distress.    Intensity THRR unchanged      Progression   Progression Continue to progress workloads to maintain intensity without signs/symptoms of physical distress.      Resistance Training  Training Prescription Yes    Weight blue bands    Reps 10-15    Time 10 Minutes      Interval Training   Interval Training No      Oxygen    Oxygen  Continuous    Liters 15   HFNC or NRB mask     NuStep   Level 2    SPM 82    Minutes 15    METs 1.8      Oxygen    Maintain Oxygen  Saturation 88% or higher          Nutrition:  Target Goals: Understanding of nutrition guidelines, daily intake of sodium 1500mg , cholesterol 200mg , calories 30% from fat and 7% or less from saturated fats, daily to have 5 or more servings of fruits and vegetables.  Biometrics:  Pre Biometrics - 11/20/23 0910       Pre Biometrics   Grip Strength 29 kg           Nutrition Therapy Plan and Nutrition Goals:   Nutrition Assessments:  MEDIFICTS Score Key: >=70 Need to make dietary changes  40-70 Heart Healthy Diet <= 40 Therapeutic Level Cholesterol Diet  Flowsheet Row INTENSIVE CARDIAC REHAB ORIENT from 10[facility]05/2023 in [facility]  Picture Your Plate Total Score on Admission 64   Picture Your Plate Scores: <59 Unhealthy dietary pattern with much room for improvement. 41-50 Dietary pattern unlikely to meet recommendations for good health and room for improvement. 51-60 More healthful dietary pattern, with some room for improvement.  >60 Healthy dietary pattern, although there  may be some specific behaviors that could be improved.    Nutrition Goals Re-Evaluation:   Nutrition Goals Discharge (Final Nutrition Goals Re-Evaluation):   Psychosocial: Target Goals: Acknowledge presence or absence of significant depression and/or stress, maximize coping skills, provide positive support system. Participant is able to verbalize types and ability to use techniques and skills needed for reducing stress and depression.  Initial Review & Psychosocial Screening:  Initial Psych Review & Screening - 11/20/23 0918       Initial Review   Current issues with None Identified      Family Dynamics   Good Support System? Yes   Khamauri has his wife, 3 children and 3 grandchildren for support   Comments wife, 3 children, 3 grandchildren      Barriers   Psychosocial barriers to participate in program There are no identifiable barriers or psychosocial needs.      Screening Interventions   Interventions Encouraged to exercise          Quality of Life Scores:  Quality of Life - 10/05/23 1536       Quality of Life Scores   Health/Function Pre 22.23 %    Socioeconomic Pre 23.58 %    Psych/Spiritual Pre 27.14 %    Family Pre 27 %    GLOBAL Pre 24.24 %         Scores of 19 and below usually indicate a poorer quality of life in these areas.  A difference of  2-3 points is a clinically meaningful difference.  A difference of 2-3 points in the total score of the Quality of Life Index has been associated with significant improvement in overall quality of life, self-image, physical symptoms, and general health in studies assessing change in quality of life.  PHQ-9: Review Flowsheet  More data exists      11/20/2023 10/05/2023 08/29/2023 08/02/2023 07/06/2023  Depression  screen PHQ 2/9  Decreased Interest 1 2 2  0 0  Down, Depressed, Hopeless 0 0 0 0 0  PHQ - 2 Score 1 2 2  0 0  Altered sleeping 0 0 0 0 -  Tired, decreased energy 1 2 2  0 -  Change in appetite 0 0 0 0 -   Feeling bad or failure about yourself  0 0 0 0 -  Trouble concentrating 0 0 0 0 -  Moving slowly or fidgety/restless 0 0 0 0 -  Suicidal thoughts 0 0 0 0 -  PHQ-9 Score 2  4  4   0  -  Difficult doing work/chores Not difficult at all Not difficult at all Not difficult at all Not difficult at all -    Details       Data saved with a previous flowsheet row definition        Interpretation of Total Score  Total Score Depression Severity:  1-4 = Minimal depression, 5-9 = Mild depression, 10-14 = Moderate depression, 15-19 = Moderately severe depression, 20-27 = Severe depression   Psychosocial Evaluation and Intervention:  Psychosocial Evaluation - 11/20/23 0919       Psychosocial Evaluation & Interventions   Interventions Encouraged to exercise with the program and follow exercise prescription    Comments No barriers or concerns were identified at this time.    Expected Outcomes For Warner to continue to be free of barriers or psychosocial concerns while participating in pulmonary rehab.    Continue Psychosocial Services  No Follow up required          Psychosocial Re-Evaluation:  Psychosocial Re-Evaluation     Row Name 11/27/23 1308 12/27/23 1031           Psychosocial Re-Evaluation   Current issues with None Identified None Identified      Comments Balthazar is scheduled to start PR on 11/28/23. No new psy/sco barriers or concerns since orientation. Monthly psy/soc re-eval is as follows: Etan denies any new psy/soc barriers or concerns at this time. He comes to class with a good attitude and works hard in the time he is here. No needs at this time.      Expected Outcomes For Sharron to participate in PR free of any psy/soc barriers or concerns For Doroteo to participate in PR free of any psy/soc barriers or concerns      Interventions Encouraged to attend Pulmonary Rehabilitation for the exercise Encouraged to attend Pulmonary Rehabilitation for the exercise      Continue  Psychosocial Services  No Follow up required No Follow up required         Psychosocial Discharge (Final Psychosocial Re-Evaluation):  Psychosocial Re-Evaluation - 12/27/23 1031       Psychosocial Re-Evaluation   Current issues with None Identified    Comments Monthly psy/soc re-eval is as follows: Dellas denies any new psy/soc barriers or concerns at this time. He comes to class with a good attitude and works hard in the time he is here. No needs at this time.    Expected Outcomes For Orest to participate in PR free of any psy/soc barriers or concerns    Interventions Encouraged to attend Pulmonary Rehabilitation for the exercise    Continue Psychosocial Services  No Follow up required          Education: Education Goals: Education classes will be provided on a weekly basis, covering required topics. Participant will state understanding/return demonstration of topics presented.  Learning Barriers/Preferences:  Learning  Barriers/Preferences - 11/20/23 0919       Learning Barriers/Preferences   Learning Barriers Hearing;Sight    Learning Preferences Skilled Demonstration;Group Instruction;Individual Instruction          Education Topics: Know Your Numbers Group instruction that is supported by a PowerPoint presentation. Instructor discusses importance of knowing and understanding resting, exercise, and post-exercise oxygen  saturation, heart rate, and blood pressure. Oxygen  saturation, heart rate, blood pressure, rating of perceived exertion, and dyspnea are reviewed along with a normal range for these values.    Exercise for the Pulmonary Patient Group instruction that is supported by a PowerPoint presentation. Instructor discusses benefits of exercise, core components of exercise, frequency, duration, and intensity of an exercise routine, importance of utilizing pulse oximetry during exercise, safety while exercising, and options of places to exercise outside of rehab.     MET Level  Group instruction provided by PowerPoint, verbal discussion, and written material to support subject matter. Instructor reviews what METs are and how to increase METs.  Flowsheet Row PULMONARY REHAB OTHER RESPIRATORY from 12/14/2023 in [facility], V[facility], & Lung [facility]  Date 12/14/23  Educator EP  Instruction Review Code 1- Verbalizes Understanding    Pulmonary Medications Verbally interactive group education provided by instructor with focus on inhaled medications and proper administration. Flowsheet Row PULMONARY REHAB OTHER RESPIRATORY from 09/19/2019 in [facility] for [facility], Vascular, & Lung Health  Date 09/19/19  Educator handout  Instruction Review Code 2- Demonstrated Understanding    Anatomy and Physiology of the Respiratory System Group instruction provided by PowerPoint, verbal discussion, and written material to support subject matter. Instructor reviews respiratory cycle and anatomical components of the respiratory system and their functions. Instructor also reviews differences in obstructive and restrictive respiratory diseases with examples of each.  Flowsheet Row PULMONARY REHAB OTHER RESPIRATORY from 12/28/2023 in [facility] for Heart, Vascular, & Lung Health  Date 12/28/23  Educator RT  Instruction Review Code 1- Verbalizes Understanding    Oxygen  Safety Group instruction provided by PowerPoint, verbal discussion, and written material to support subject matter. There is an overview of "What is Oxygen " and "Why do we need it".  Instructor also reviews how to create a safe environment for oxygen  use, the importance of using oxygen  as prescribed, and the risks of noncompliance. There is a brief discussion on traveling with oxygen  and resources the patient may utilize.   Oxygen  Use Group instruction provided by PowerPoint, verbal discussion, and written material to discuss  how supplemental oxygen  is prescribed and different types of oxygen  supply systems. Resources for more information are provided.    Breathing Techniques Group instruction that is supported by demonstration and informational handouts. Instructor discusses the benefits of pursed lip and diaphragmatic breathing and detailed demonstration on how to perform both.     Risk Factor Reduction Group instruction that is supported by a PowerPoint presentation. Instructor discusses the definition of a risk factor, different risk factors for pulmonary disease, and how the heart and lungs work together. Flowsheet Row PULMONARY REHAB OTHER RESPIRATORY from 01/29/2019 in [facility] for Heart, Vascular, & Lung Health  Date 01/08/19  Educator --  [Handout]    Pulmonary Diseases Group instruction provided by PowerPoint, verbal discussion, and written material to support subject matter. Instructor gives an overview of the different type of pulmonary diseases. There is also a discussion on risk factors and symptoms as well as ways to manage the  diseases. Flowsheet Row PULMONARY REHAB OTHER RESPIRATORY from 12/21/2023 in [facility] for Heart, Vascular, & Lung Health  Date 12/21/23  Educator RT  Instruction Review Code 1- Verbalizes Understanding    Stress and Energy Conservation Group instruction provided by PowerPoint, verbal discussion, and written material to support subject matter. Instructor gives an overview of stress and the impact it can have on the body. Instructor also reviews ways to reduce stress. There is also a discussion on energy conservation and ways to conserve energy throughout the day.   Warning Signs and Symptoms Group instruction provided by PowerPoint, verbal discussion, and written material to support subject matter. Instructor reviews warning signs and symptoms of stroke, heart attack, cold and flu. Instructor also reviews ways to prevent  the spread of infection. Flowsheet Row PULMONARY REHAB OTHER RESPIRATORY from 11/30/2023 in [facility] for Heart, Vascular, & Lung Health  Date 11/30/23  Educator RN  Instruction Review Code 1- Verbalizes Understanding    Other Education Group or individual verbal, written, or video instructions that support the educational goals of the pulmonary rehab program. Flowsheet Row PULMONARY REHAB OTHER RESPIRATORY from 11/07/2019 in [facility] for Heart, Vascular, & Lung Health  Date 11/07/19  [facility] your numbers]  Educator Handout  Instruction Review Code 2- Demonstrated Understanding     Knowledge Questionnaire Score:  Knowledge Questionnaire Score - 11/20/23 0928       Knowledge Questionnaire Score   Pre Score 15//18          Core Components/Risk Factors/Patient Goals at Admission:  Personal Goals and Risk Factors at Admission - 11/20/23 0919       Core Components/Risk Factors/Patient Goals on Admission   Improve shortness of breath with ADL's Yes    Intervention Provide education, individualized exercise plan and daily activity instruction to help decrease symptoms of SOB with activities of daily living.    Expected Outcomes Short Term: Improve cardiorespiratory fitness to achieve a reduction of symptoms when performing ADLs;Long Term: Be able to perform more ADLs without symptoms or delay the onset of symptoms          Core Components/Risk Factors/Patient Goals Review:   Goals and Risk Factor Review     Row Name 11/27/23 1310 12/27/23 1035           Core Components/Risk Factors/Patient Goals Review   Personal Goals Review Improve shortness of breath with ADL's;Develop more efficient breathing techniques such as purse lipped breathing and diaphragmatic breathing and practicing self-pacing with activity. Improve shortness of breath with ADL's;Develop more efficient breathing techniques such as purse lipped breathing and  diaphragmatic breathing and practicing self-pacing with activity.      Review Monthly review of patient's Core Components/Risk Factors/Patient Goals are as follows: Jene has not started PR yet. Unable to asses his goals at this time. Monthly review of patient's Core Components/Risk Factors/Patient Goals are as follows: Goal progressing for improving shortness of breath. Lacharles is currently using NRB mask while exercising to keep sats >88%. He is currently exercising on the Nustep for 30 minutes. Goal progressing for developing more efficient breathing techniques such as purse lipped breathing and diaphragmatic breathing; and practicing self-pacing with activity. We will continue to monitor his progress throughout the program.      Expected Outcomes To improve shortness of breath with ADL's and develop more efficient breathing techniques such as purse lipped breathing and diaphragmatic breathing; and practicing self-pacing with activity. To improve shortness of  breath with ADL's and develop more efficient breathing techniques such as purse lipped breathing and diaphragmatic breathing; and practicing self-pacing with activity.         Core Components/Risk Factors/Patient Goals at Discharge (Final Review):   Goals and Risk Factor Review - 12/27/23 1035       Core Components/Risk Factors/Patient Goals Review   Personal Goals Review Improve shortness of breath with ADL's;Develop more efficient breathing techniques such as purse lipped breathing and diaphragmatic breathing and practicing self-pacing with activity.    Review Monthly review of patient's Core Components/Risk Factors/Patient Goals are as follows: Goal progressing for improving shortness of breath. Muriel is currently using NRB mask while exercising to keep sats >88%. He is currently exercising on the [place] for 30 minutes. Goal progressing for developing more efficient breathing techniques such as purse lipped breathing and diaphragmatic breathing;  and practicing self-pacing with activity. We will continue to monitor his progress throughout the program.    Expected Outcomes To improve shortness of breath with ADL's and develop more efficient breathing techniques such as purse lipped breathing and diaphragmatic breathing; and practicing self-pacing with activity.          ITP Comments: Pt is making expected progress toward Pulmonary Rehab goals after completing 8 session(s). Recommend continued exercise, life style modification, education, and utilization of breathing techniques to increase stamina and strength, while also decreasing shortness of breath with exertion.  Dr. Slater Staff is Medical Director for Pulmonary Rehab at [facility].

## 2024-01-08 ENCOUNTER — Ambulatory Visit: Admitting: Family Medicine

## 2024-01-09 ENCOUNTER — Encounter (HOSPITAL_COMMUNITY)
Admission: RE | Admit: 2024-01-09 | Discharge: 2024-01-09 | Disposition: A | Discharge status: Home / Self Care | Source: Ambulatory Visit | Attending: Cardiovascular Disease | Admitting: Cardiovascular Disease

## 2024-01-09 DIAGNOSIS — J84111 Idiopathic interstitial pneumonia, not otherwise specified: Secondary | ICD-10-CM | POA: Insufficient documentation

## 2024-01-09 NOTE — Progress Notes (Signed)
 Daily Session Note  Patient Details  Name: William [name] MRN: [id number] Date of Birth: 1950/12/25 Referring Provider:   Conrad Ports Pulmonary Rehab Walk Test from 11/20/2023 in [facility], V[facility], & [facility]  Referring Provider Dr. Almond Cone    Encounter Date: 01/09/2024  Check In:  Session Check In - 01/09/24 1056       Check-In   Supervising physician immediately available to respond to emergencies [facility] MD immediately available    Physician(s) Damien Braver, NP    Location [facility]    Staff Present Augustin Sharps, Neita Moats, MS, ACSM-CEP, Exercise Physiologist;Shourya Macpherson Harvy, RN, BSN;Randi Reeve BS, ACSM-CEP, Exercise Physiologist    Virtual Visit No    Medication changes reported     No    Fall or balance concerns reported    No    Tobacco Cessation No Change    Warm-up and Cool-down Performed as group-led instruction    Resistance Training Performed Yes    VAD Patient? No    PAD/SET Patient? No      Pain Assessment   Currently in Pain? No/denies    Pain Score 0-No pain    Multiple Pain Sites No          Capillary Blood Glucose: No results found. However, due to the size of the patient record, not all encounters were searched. Please check Results Review for a complete set of results.    Social History   Tobacco Use  Smoking Status Former   Current packs/day: 0.00   Average packs/day: 0.5 packs/day for 17.0 years (8.5 ttl pk-yrs)   Types: Cigarettes   Start date: 24   Quit date: 22   Years since quitting: 42.9  Smokeless Tobacco Never  Tobacco Comments   QUIT I 1983    Goals Met:  Independence with exercise equipment Exercise tolerated well Queuing for purse lip breathing No report of concerns or symptoms today Strength training completed today  Goals Unmet:  Not Applicable  Comments: Service time is from 1017 to 1146    Dr. Slater Staff is Medical Director for Pulmonary  Rehab at [facility].

## 2024-01-11 ENCOUNTER — Encounter (HOSPITAL_COMMUNITY)
Admission: RE | Admit: 2024-01-11 | Discharge: 2024-01-11 | Disposition: A | Source: Ambulatory Visit | Attending: Pulmonary Disease

## 2024-01-11 DIAGNOSIS — J84111 Idiopathic interstitial pneumonia, not otherwise specified: Secondary | ICD-10-CM

## 2024-01-11 NOTE — Progress Notes (Signed)
 Daily Session Note  Patient Details  Name: William [name] MRN: [id number] Date of Birth: Dec 11, 1950 Referring Provider:   Conrad Ports Pulmonary Rehab Walk Test from 11/20/2023 in [facility] [address] for Heart, Vascular, & Lung Health  Referring Provider Dr. Almond Cone    Encounter Date: 01/11/2024  Check In:  Session Check In - 01/11/24 1128       Check-In   Supervising physician immediately available to respond to emergencies [facility] MD immediately available    Physician(s) Rosaline Bane, NP    [facility]    Staff Present Augustin Sharps, Neita Moats, MS, ACSM-CEP, Exercise Physiologist;Mary Harvy, RN, BSN;Randi Midge BS, ACSM-CEP, Exercise Physiologist;Carlette Bernett, RN, BSN    Virtual Visit No    Medication changes reported     No    Fall or balance concerns reported    No    Tobacco Cessation No Change    Warm-up and Cool-down Performed as group-led instruction    Resistance Training Performed Yes    VAD Patient? No    PAD/SET Patient? No      Pain Assessment   Currently in Pain? No/denies    Pain Score 0-No pain    Multiple Pain Sites No          Capillary Blood Glucose: No results found. However, due to the size of the patient record, not all encounters were searched. Please check Results Review for a complete set of results.    Social History   Tobacco Use  Smoking Status Former   Current packs/day: 0.00   Average packs/day: 0.5 packs/day for 17.0 years (8.5 ttl pk-yrs)   Types: Cigarettes   Start date: 59   Quit date: 34   Years since quitting: 42.9  Smokeless Tobacco Never  Tobacco Comments   QUIT I 1983    Goals Met:  Proper associated with RPD/PD & O2 Sat Exercise tolerated well No report of concerns or symptoms today Strength training completed today  Goals Unmet:  Not Applicable  Comments: Service time is from 1011 to 1141.    Dr. Slater Staff is Medical Director for Pulmonary  Rehab at [facility].

## 2024-01-16 ENCOUNTER — Other Ambulatory Visit (HOSPITAL_COMMUNITY): Payer: Self-pay

## 2024-01-16 ENCOUNTER — Encounter (HOSPITAL_COMMUNITY)
Admission: RE | Admit: 2024-01-16 | Discharge: 2024-01-16 | Disposition: A | Source: Ambulatory Visit | Attending: Pulmonary Disease

## 2024-01-16 ENCOUNTER — Other Ambulatory Visit: Payer: Self-pay

## 2024-01-16 VITALS — Wt 208.1 lb

## 2024-01-16 DIAGNOSIS — J84111 Idiopathic interstitial pneumonia, not otherwise specified: Secondary | ICD-10-CM

## 2024-01-16 MED ORDER — PANTOPRAZOLE SODIUM 40 MG PO TBEC
40.0000 mg | DELAYED_RELEASE_TABLET | Freq: Every day | ORAL | 3 refills | Status: DC
  Filled 2024-01-16: qty 90, 90d supply, fill #0

## 2024-01-16 NOTE — Progress Notes (Signed)
 Daily Session Note  Patient Details  Name: William [name] MRN: [id number] Date of Birth: 06-03-1950 Referring Provider:   Conrad Ports Pulmonary Rehab Walk Test from 11/20/2023 in [facility] for Heart, Vascular, & Lung Health  Referring Provider Dr. Almond Cone    Encounter Date: 01/16/2024  Check In:  Session Check In - 01/16/24 1050       Check-In   Supervising physician immediately available to respond to emergencies [facility] MD immediately available   Simultaneous filing. User may not have seen previous data.   Physician(s) Rosaline Bane, NP   Simultaneous filing. User may not have seen previous data.   [facility]   Simultaneous filing. User may not have seen previous data.   Staff Present Augustin Sharps, Neita Moats, MS, ACSM-CEP, Exercise Physiologist;Mary Bastin, RN, BSN;Randi Midge BS, ACSM-CEP, Exercise Physiologist   Simultaneous filing. User may not have seen previous data.   Virtual Visit No   Simultaneous filing. User may not have seen previous data.   Medication changes reported     No   Simultaneous filing. User may not have seen previous data.   Fall or balance concerns reported    No   Simultaneous filing. User may not have seen previous data.   Tobacco Cessation No Change   Simultaneous filing. User may not have seen previous data.   Warm-up and Cool-down Performed as group-led instruction   Simultaneous filing. User may not have seen previous data.   Resistance Training Performed Yes   Simultaneous filing. User may not have seen previous data.   VAD Patient? No   Simultaneous filing. User may not have seen previous data.   PAD/SET Patient? No   Simultaneous filing. User may not have seen previous data.     Pain Assessment   Currently in Pain? No/denies   Simultaneous filing. User may not have seen previous data.   Pain Score 0-No pain    Multiple Pain Sites No   Simultaneous filing. User may not have seen  previous data.         Capillary Blood Glucose: No results found. However, due to the size of the patient record, not all encounters were searched. Please check Results Review for a complete set of results.   Exercise Prescription Changes - 01/16/24 1100       Response to Exercise   Blood Pressure (Admit) 140/70    Blood Pressure (Exercise) 124/60    Blood Pressure (Exit) 104/60    Heart Rate (Admit) 86 bpm    Heart Rate (Exercise) 78 bpm    Heart Rate (Exit) 75 bpm    Oxygen  Saturation (Admit) 91 %    Oxygen  Saturation (Exercise) 94 %    Oxygen  Saturation (Exit) 97 %    Rating of Perceived Exertion (Exercise) 13    Perceived Dyspnea (Exercise) 2    Duration Continue with 30 min of aerobic exercise without signs/symptoms of physical distress.    Intensity THRR unchanged      Progression   Progression Continue to progress workloads to maintain intensity without signs/symptoms of physical distress.      Resistance Training   Training Prescription Yes    Weight blue bands    Reps 10-15    Time 10 Minutes      Oxygen    Oxygen  Continuous    Liters --   15L+NRB mask     NuStep   Level 2    SPM 80  Minutes 15    METs 1.7      Oxygen    Maintain Oxygen  Saturation 88% or higher          Social History   Tobacco Use  Smoking Status Former   Current packs/day: 0.00   Average packs/day: 0.5 packs/day for 17.0 years (8.5 ttl pk-yrs)   Types: Cigarettes   Start date: 58   Quit date: 30   Years since quitting: 42.9  Smokeless Tobacco Never  Tobacco Comments   QUIT I 1983    Goals Met:  Proper associated with RPD/PD & O2 Sat Independence with exercise equipment Exercise tolerated well No report of concerns or symptoms today Strength training completed today  Goals Unmet:  Not Applicable  Comments: Service time is from 1020 to 1128.    Dr. Slater Staff is Medical Director for Pulmonary Rehab at [facility].

## 2024-01-18 ENCOUNTER — Encounter (HOSPITAL_COMMUNITY)
Admission: RE | Admit: 2024-01-18 | Discharge: 2024-01-18 | Disposition: A | Discharge status: Home / Self Care | Source: Ambulatory Visit | Attending: Pulmonary Disease | Admitting: Pulmonary Disease

## 2024-01-18 DIAGNOSIS — J84111 Idiopathic interstitial pneumonia, not otherwise specified: Secondary | ICD-10-CM

## 2024-01-18 NOTE — Progress Notes (Signed)
 Daily Session Note  Patient Details  Name: William [name] MRN: [id number] Date of Birth: Jun 09, 1950 Referring Provider:   Conrad Ports Pulmonary Rehab Walk Test from 11/20/2023 in [facility] for Heart, Vascular, & Lung Health  Referring Provider Dr. Almond Cone    Encounter Date: 01/18/2024  Check In:  Session Check In - 01/18/24 1033       Check-In   Supervising physician immediately available to respond to emergencies [facility] immediately available    Physician(s) Lum Louis, NP    [facility]    Staff Present Augustin Sharps, Neita Moats, MS, ACSM-CEP, Exercise Physiologist;Mary Harvy, RN, BSN;Randi Midge BS, ACSM-CEP, Exercise Physiologist    Virtual Visit No    Fall or balance concerns reported    No    Tobacco Cessation No Change    Warm-up and Cool-down Performed as group-led instruction    Resistance Training Performed Yes    VAD Patient? No    PAD/SET Patient? No      Pain Assessment   Currently in Pain? No/denies          Capillary Blood Glucose: No results found. However, due to the size of the patient record, not all encounters were searched. Please check Results Review for a complete set of results.    Tobacco Use History[1]  Goals Met:  Proper associated with RPD/PD & O2 Sat Independence with exercise equipment Exercise tolerated well No report of concerns or symptoms today Strength training completed today  Goals Unmet:  Not Applicable  Comments: Service time is from 1017 to 1150.    Dr. Slater Staff is Medical Director for Pulmonary Rehab at [facility].     [1]  Social History Tobacco Use  Smoking Status Former   Current packs/day: 0.00   Average packs/day: 0.5 packs/day for 17.0 years (8.5 ttl pk-yrs)   Types: Cigarettes   Start date: 63   Quit date: 79   Years since quitting: 42.9  Smokeless Tobacco Never  Tobacco Comments   QUIT I 1983

## 2024-01-23 ENCOUNTER — Encounter (HOSPITAL_COMMUNITY)
Admission: RE | Admit: 2024-01-23 | Discharge: 2024-01-23 | Disposition: A | Source: Ambulatory Visit | Attending: Pulmonary Disease

## 2024-01-23 VITALS — Wt 207.5 lb

## 2024-01-23 DIAGNOSIS — J84111 Idiopathic interstitial pneumonia, not otherwise specified: Secondary | ICD-10-CM | POA: Diagnosis not present

## 2024-01-23 NOTE — Progress Notes (Signed)
 Daily Session Note  Patient Details  Name: William [name] MRN: [id number] Date of Birth: 04/18/1950 Referring Provider:   Conrad Ports Pulmonary Rehab Walk Test from 11/20/2023 in [facility] for Heart, Vascular, & Lung Health  Referring Provider Dr. Almond Cone    Encounter Date: 01/23/2024  Check In:  Session Check In - 01/23/24 1034       Check-In   Supervising physician immediately available to respond to emergencies [facility] MD immediately available    Physician(s) Orren Fabry, NP    [facility]    Staff Present Augustin Sharps, Neita Moats, MS, ACSM-CEP, Exercise Physiologist;Julie Paolini Harvy, RN, BSN;Carlette Carlton, RN, BSN    Virtual Visit No    Medication changes reported     No    Fall or balance concerns reported    No    Tobacco Cessation No Change    Warm-up and Cool-down Performed as group-led Writer Performed Yes    VAD Patient? No    PAD/SET Patient? No      Pain Assessment   Currently in Pain? No/denies    Multiple Pain Sites No          Capillary Blood Glucose: No results found. However, due to the size of the patient record, not all encounters were searched. Please check Results Review for a complete set of results.    Tobacco Use History[1]  Goals Met:  Independence with exercise equipment Exercise tolerated well Queuing for purse lip breathing No report of concerns or symptoms today Strength training completed today  Goals Unmet:  Not Applicable  Comments: Service time is from 1017 to 1141    Dr. Slater Staff is Medical Director for Pulmonary Rehab at [facility].     [1]  Social History Tobacco Use  Smoking Status Former   Current packs/day: 0.00   Average packs/day: 0.5 packs/day for 17.0 years (8.5 ttl pk-yrs)   Types: Cigarettes   Start date: 14   Quit date: 6   Years since quitting: 42.9  Smokeless Tobacco Never  Tobacco Comments    QUIT I 1983

## 2024-01-24 ENCOUNTER — Other Ambulatory Visit (HOSPITAL_COMMUNITY): Payer: Self-pay

## 2024-01-24 MED ORDER — PREDNISONE 10 MG PO TABS
20.0000 mg | ORAL_TABLET | Freq: Every day | ORAL | 1 refills | Status: DC
  Filled 2024-01-24: qty 30, 15d supply, fill #0
  Filled 2024-02-11: qty 30, 15d supply, fill #1

## 2024-01-25 ENCOUNTER — Encounter (HOSPITAL_COMMUNITY)

## 2024-01-26 ENCOUNTER — Encounter: Payer: Self-pay | Admitting: Cardiovascular Disease

## 2024-01-26 ENCOUNTER — Ambulatory Visit: Attending: Cardiovascular Disease | Admitting: Cardiovascular Disease

## 2024-01-26 VITALS — BP 118/72 | HR 70 | Ht 69.0 in | Wt 206.0 lb

## 2024-01-26 DIAGNOSIS — Z952 Presence of prosthetic heart valve: Secondary | ICD-10-CM | POA: Diagnosis not present

## 2024-01-26 DIAGNOSIS — J9611 Chronic respiratory failure with hypoxia: Secondary | ICD-10-CM

## 2024-01-26 DIAGNOSIS — I5032 Chronic diastolic (congestive) heart failure: Secondary | ICD-10-CM

## 2024-01-26 DIAGNOSIS — I2510 Atherosclerotic heart disease of native coronary artery without angina pectoris: Secondary | ICD-10-CM

## 2024-01-26 NOTE — Patient Instructions (Signed)
 Medication Instructions:  No medication changes were made at this visit. Continue current regimen.   *If you need a refill on your cardiac medications before your next appointment, please call your pharmacy*  Lab Work: None ordered today. If you have labs (blood work) drawn today and your tests are completely normal, you will receive your results only by: MyChart Message (if you have MyChart) OR A paper copy in the mail If you have any lab test that is abnormal or we need to change your treatment, we will call you to review the results.  Testing/Procedures: None ordered today.  Follow-Up: At [facility], you and your health needs are our priority.  As part of our continuing mission to provide you with exceptional heart care, our providers are all part of one team.  This team includes your primary Cardiologist (physician) and Advanced Practice Providers or APPs (Physician Assistants and Nurse Practitioners) who all work together to provide you with the care you need, when you need it.  Your next appointment:   6 month(s)  Provider:   Glendia Ferrier, PA-C     Then, [name], MD will plan to see you again in 1 year(s).

## 2024-01-26 NOTE — Progress Notes (Signed)
 " Cardiology Office Note:    Date:  01/26/2024   ID:  William [name], William [name] 11-12-1950, MRN [id number]  PCP:  Candise Aleene DEL, MD   [facility] Providers Cardiologist:  [name], MD     Referring MD: Candise Aleene DEL, MD   Chief Complaint  Patient presents with   Coronary Artery Disease    History of Present Illness:    William [name] is a 73 y.o. male with a hx of:  Coronary artery disease  S/p CABG in 2008 [facility]  05/04/2016: Normal perfusion, EF 66; low risk R/LHC 05/10/2023 ([facility]): mod pulm HTN, 1/3 grafts patent >> LAD ostial 80, proximal 100, D1 80; LCx ostial 50, proximal 80, distal 100; RI 80; RCA ostial 50, proximal 100; LIMA-LAD patent, SVG-RI 100, SVG-PDA 100; PCWP 22, mean PA 35  (HFpEF) heart failure with preserved ejection fraction  Aortic stenosis s/p TAVR ([facility]) 06/27/23 TTE 06/28/23 ([facility]) - post TAVR: EF > 55, NL RVSF, mild MR, trivial PI, mild TR, RVSP 53 mmHg, TAVR w mean 4 mmHg, no PVL Cardiac MRA 04/19/22 ([facility]): Ao root 4 cm, ascending aorta 3.8 cm Pulmonary hypertension  Obesity  Metabolic syndrome Chronic kidney disease Hyperkalemia >> ARB (Irbesartan ) stopped  Hypertension Hyperlipidemia  Anemia GI blood loss Cirrhosis  S/p Liver Tx at [facility] 2021 Abdominal aortic aneurysm  S/p EVAR at [facility] 2020 Ascending aortic aneurysm CT 04/2018: 3.9 cm MRA 10/2019 ([facility]): Aortic root 4 cm, ascending aorta 3.9 cm MRA 10/27/2020 ([facility]): Root 4 cm, ascending aorta 3.8 cm Pulmonary fibrosis Followed by Pulmonology at [facility]        The patient is here with his wife today.  His wife used to work in the [employer] quality department on the vascular registry and also spent some time on acute MI registry.  The patient is now on 7 to 8 L of O2.  His shortness of breath is essentially unchanged.  He denies edema, orthopnea, or PND.  He occasionally has some burning in the upper chest when his oxygen  levels drop below.  No other complaints today.  He continues  with close follow-up at [facility].  He was hospitalized there after he desaturated locally at pulmonary rehab.  He was reportedly in the hospital for about 8 days and underwent cardiac evaluation there.  An echo bubble study was suggestive of right-to-left shunting with early bubbles.  A right heart catheterization showed no significant shunt.  He was noted to have an RA pressure of 5 mmHg, mean PA pressure of 41 mmHg, wedge pressure 26 mmHg, and a cardiac index of 2.5 on 10 L of oxygen , PVR 2.9 Wood units, and shunt ratio 1.0.  An echocardiogram during that same hospitalization showed normal function of his TAVR valve with no significant aortic stenosis or aortic regurgitation.  LVEF is greater than 55%.  Current Medications: Active Medications[1]   Allergies:   Niacinamide, Thiazide-type diuretics, and Quinolones   ROS:   Please see the history of present illness.    All other systems reviewed and are negative.  EKGs/Labs/Other Studies Reviewed:    The following studies were reviewed today: Cardiac Studies & Procedures   ______________________________________________________________________________________________     ECHOCARDIOGRAM  ECHOCARDIOGRAM COMPLETE 10/21/2020  Narrative ECHOCARDIOGRAM REPORT    Patient Name:   William [name] Date of Exam: 10/21/2020 Medical Rec #:  [id number]       Height:       68.0 in Accession #:    [id number]  Weight:       233.6 lb Date of Birth:  12/22/1950        BSA:          2.183 m Patient Age:    70 years        BP:           127/80 mmHg Patient Gender: M               HR:           63 bpm. Exam Location:  [address]  Procedure: 2D Echo, Cardiac Doppler, Color Doppler and Intracardiac Opacification Agent  Indications:    I50.31 Acute diastolic (congestive) heart failure; I35.0 Nonrheumatic aortic (valve) stenosis  History:        Patient has prior history of Echocardiogram examinations, most recent 10/11/2017. CAD, Prior CABG;  Risk Factors:Hypertension and Dyslipidemia. Morbid obesity. Chronic kidney disease. AAA.  Sonographer:    Jon Hacker RCS Referring Phys: [id number] JESSE M CLEAVER  IMPRESSIONS   1. Left ventricular ejection fraction, by estimation, is 60 to 65%. The left ventricle has normal function. The left ventricle has no regional wall motion abnormalities. Left ventricular diastolic parameters were normal. 2. Right ventricular systolic function is normal. The right ventricular size is normal. 3. The mitral valve is grossly normal. Trivial mitral valve regurgitation. 4. The aortic valve is calcified. There is moderate calcification of the aortic valve. There is moderate thickening of the aortic valve. Aortic valve regurgitation is trivial.  FINDINGS Left Ventricle: Left ventricular ejection fraction, by estimation, is 60 to 65%. The left ventricle has normal function. The left ventricle has no regional wall motion abnormalities. The left ventricular internal cavity size was normal in size. There is no left ventricular hypertrophy. Left ventricular diastolic parameters were normal.  Right Ventricle: The right ventricular size is normal. Right vetricular wall thickness was not well visualized. Right ventricular systolic function is normal.  Left Atrium: Left atrial size was normal in size.  Right Atrium: Right atrial size was normal in size.  Pericardium: There is no evidence of pericardial effusion.  Mitral Valve: The mitral valve is grossly normal. Trivial mitral valve regurgitation.  Tricuspid Valve: The tricuspid valve is normal in structure. Tricuspid valve regurgitation is not demonstrated.  Aortic Valve: The aortic valve is calcified. There is moderate calcification of the aortic valve. There is moderate thickening of the aortic valve. There is moderate aortic valve annular calcification. Aortic valve regurgitation is trivial. Aortic valve mean gradient measures 14.0 mmHg. Aortic valve peak  gradient measures 24.0 mmHg. Aortic valve area, by VTI measures 1.73 cm.  Pulmonic Valve: The pulmonic valve was normal in structure. Pulmonic valve regurgitation is not visualized.  Aorta: The aortic root and ascending aorta are structurally normal, with no evidence of dilitation.  IAS/Shunts: The atrial septum is grossly normal.   LEFT VENTRICLE PLAX 2D LVIDd:         4.80 cm  Diastology LVIDs:         3.40 cm  LV e' medial:    7.40 cm/s LV PW:         1.00 cm  LV E/e' medial:  12.2 LV IVS:        1.10 cm  LV e' lateral:   8.49 cm/s LVOT diam:     2.20 cm  LV E/e' lateral: 10.6 LV SV:         99 LV SV Index:   45 LVOT Area:  3.80 cm   RIGHT VENTRICLE RV S prime:     8.70 cm/s  LEFT ATRIUM             Index       RIGHT ATRIUM           Index LA diam:        3.90 cm 1.79 cm/m  RA Area:     15.30 cm LA Vol (A2C):   78.2 ml 35.82 ml/m RA Volume:   37.50 ml  17.18 ml/m LA Vol (A4C):   48.4 ml 22.17 ml/m LA Biplane Vol: 64.1 ml 29.36 ml/m AORTIC VALVE AV Area (Vmax):    1.63 cm AV Area (Vmean):   1.59 cm AV Area (VTI):     1.73 cm AV Vmax:           245.00 cm/s AV Vmean:          172.500 cm/s AV VTI:            0.574 m AV Peak Grad:      24.0 mmHg AV Mean Grad:      14.0 mmHg LVOT Vmax:         105.00 cm/s LVOT Vmean:        72.300 cm/s LVOT VTI:          0.261 m LVOT/AV VTI ratio: 0.45  AORTA Ao Root diam: 3.90 cm Ao Asc diam:  3.80 cm  MITRAL VALVE MV Area (PHT): 2.99 cm     SHUNTS MV Decel Time: 254 msec     Systemic VTI:  0.26 m MV E velocity: 90.10 cm/s   Systemic Diam: 2.20 cm MV A velocity: 109.00 cm/s MV E/A ratio:  0.83  Aleene Passe MD Electronically signed by Aleene Passe MD Signature Date/Time: 10/21/2020/1:27:42 PM    Final          ______________________________________________________________________________________________      EKG:        Recent Labs: 11/30/2023: ALT 12; Hemoglobin 14.5; Platelets 234; Pro Brain  Natriuretic Peptide 1,401.0 12/25/2023: BUN 16; Creatinine, Ser 1.20; Potassium 4.4; Sodium 140  Recent Lipid Panel    Component Value Date/Time   CHOL 90 01/26/2023 1009   CHOL 118 10/29/2021 0807   CHOL 135 11/29/2013 0940   TRIG 128.0 01/26/2023 1009   TRIG 177 (H) 11/29/2013 0940   HDL 24.70 (L) 01/26/2023 1009   HDL 26 (L) 10/29/2021 0807   HDL 34 (L) 11/29/2013 0940   CHOLHDL 4 01/26/2023 1009   VLDL 25.6 01/26/2023 1009   LDLCALC 40 01/26/2023 1009   LDLCALC 65 10/29/2021 0807   LDLCALC 66 11/29/2013 0940   LDLDIRECT 162.0 01/13/2021 0933     Risk Assessment/Calculations:                Physical Exam:    VS:  BP 118/72 (BP Location: Left Arm, Patient Position: Sitting, Cuff Size: Normal)   Pulse 70   Ht 5' 9 (1.753 m)   Wt 206 lb (93.4 kg)   SpO2 92%   BMI 30.42 kg/m     Wt Readings from Last 3 Encounters:  01/26/24 206 lb (93.4 kg)  01/16/24 208 lb 1.8 oz (94.4 kg)  01/02/24 208 lb 5.4 oz (94.5 kg)     GEN:  Well nourished, well developed pleasant male on 8 L O2 per nasal cannula in no acute distress HEENT: Normal NECK: No JVD; No carotid bruits LYMPHATICS: No lymphadenopathy CARDIAC: RRR, no murmurs, rubs, gallops RESPIRATORY:  Clear to auscultation without rales, wheezing or rhonchi  ABDOMEN: Soft, non-tender, non-distended MUSCULOSKELETAL:  No edema; No deformity  SKIN: Warm and dry NEUROLOGIC:  Alert and oriented x 3 PSYCHIATRIC:  Normal affect   Assessment & Plan Coronary artery disease involving native coronary artery of native heart without angina pectoris Reports anginal symptoms when his oxygen  levels are diminished.  Otherwise appears clinically stable.  Continue aspirin, metoprolol  and amlodipine  for antianginal therapy, and rosuvastatin  for lipid lowering. Chronic heart failure with preserved ejection fraction (HCC) Clinically stable.  Echo studies from [facility] reviewed.  Recommend follow-up 6 months.  Continue furosemide  at  current dose. S/P TAVR (transcatheter aortic valve replacement) Normal function of his TAVR prosthesis.  Continue SBE prophylaxis when indicated.  Continue aspirin for antiplatelet therapy. Chronic respiratory failure with hypoxia (HCC) Appears secondary to pulmonary fibrosis, now on 7 to 8 L of oxygen .  Followed closely at [facility] where he also had liver transplantation in the past.  Plan APP follow-up 6 months with Glendia Ferrier, PA-C in 1 year with me.  He continues to also follow at [facility] pulmonology.  Prefers cardiology follow-up locally due to difficulty with the long drive.          Medication Adjustments/Labs and Tests Ordered: Current medicines are reviewed at length with the patient today.  Concerns regarding medicines are outlined above.  No orders of the defined types were placed in this encounter.  No orders of the defined types were placed in this encounter.   Patient Instructions  Medication Instructions:  No medication changes were made at this visit. Continue current regimen.   *If you need a refill on your cardiac medications before your next appointment, please call your pharmacy*  Lab Work: None ordered today. If you have labs (blood work) drawn today and your tests are completely normal, you will receive your results only by: MyChart Message (if you have MyChart) OR A paper copy in the mail If you have any lab test that is abnormal or we need to change your treatment, we will call you to review the results.  Testing/Procedures: None ordered today.  Follow-Up: At [facility], you and your health needs are our priority.  As part of our continuing mission to provide you with exceptional heart care, our providers are all part of one team.  This team includes your primary Cardiologist (physician) and Advanced Practice Providers or APPs (Physician Assistants and Nurse Practitioners) who all work together to provide you with the care you  need, when you need it.  Your next appointment:   6 month(s)  Provider:   Glendia Ferrier, PA-C     Then, [name], MD will plan to see you again in 1 year(s).     Signed, [name], MD  01/26/2024 5:52 PM    [facility]     [1]  Current Meds  Medication Sig   acetaminophen  (TYLENOL ) 500 MG tablet Take 1,000 mg by mouth as needed.   amLODipine  (NORVASC ) 10 MG tablet Take 1 tablet (10 mg total) by mouth daily.   ammonium lactate  (AMLACTIN) 12 % cream Apply 2 times daily to forearms and hands in preparation for treatment with Efudex  (fluorouracil ) (Patient taking differently: Apply 1 Application topically as needed for dry skin.)   amoxicillin  (AMOXIL ) 500 MG capsule Prophylaxis for dental procedure. Take 2 grams (4x 500mg  tablets) 1 hour before procedure.   aspirin EC 81 MG tablet Take 81 mg by mouth daily. Swallow  whole.   furosemide  (LASIX ) 20 MG tablet Take 2 tablets (40 mg total) by mouth once daily   metFORMIN  (GLUCOPHAGE ) 500 MG tablet Take 1 tablet (500 mg total) by mouth 2 (two) times daily with a meal.   metoprolol  tartrate (LOPRESSOR ) 100 MG tablet Take 1 tablet (100 mg total) by mouth 2 (two) times daily.   mycophenolate  (CELLCEPT ) 250 MG capsule Take 2 capsules by mouth in the morning and 1 capsule in the evening.   pantoprazole  (PROTONIX ) 40 MG tablet Take 1 tablet (40 mg total) by mouth daily.   predniSONE  (DELTASONE ) 10 MG tablet Take 2 tablets (20 mg total) by mouth daily.   rosuvastatin  (CRESTOR ) 10 MG tablet Take 1 tablet (10 mg total) by mouth daily.   tacrolimus  (PROGRAF ) 1 MG capsule Take 2 capsules (2 mg total) by mouth every morning AND 1 capsule (1 mg total) every evening.   triamcinolone  cream (KENALOG ) 0.1 % Apply 2 times a day to rash. Follow with CeraVe Cream.  Stop when smooth.   "

## 2024-01-27 ENCOUNTER — Other Ambulatory Visit (HOSPITAL_COMMUNITY): Payer: Self-pay

## 2024-01-29 ENCOUNTER — Encounter: Payer: Self-pay | Admitting: Hematology

## 2024-01-29 ENCOUNTER — Other Ambulatory Visit (HOSPITAL_COMMUNITY): Payer: Self-pay

## 2024-01-29 MED ORDER — MG PLUS PROTEIN 133 MG PO TABS
ORAL_TABLET | Freq: Two times a day (BID) | ORAL | 11 refills | Status: DC
  Filled 2024-01-29: qty 100, 50d supply, fill #0

## 2024-01-30 ENCOUNTER — Encounter (HOSPITAL_COMMUNITY)

## 2024-01-31 NOTE — Progress Notes (Signed)
 Pulmonary Individual Treatment Plan  Patient Details  Name: William [name] MRN: [id number] Date of Birth: Dec 30, 1950 Referring Provider:   Conrad Ports Pulmonary Rehab Walk Test from 11/20/2023 in [facility]  Referring Provider Dr. Almond Cone    Initial Encounter Date:  Flowsheet Row Pulmonary Rehab Walk Test from 11/20/2023 in [facility]  Date 11/20/23    Visit Diagnosis: Idiopathic interstitial pneumonia, not otherwise specified [facility])  Patient's Home Medications on Admission:  Current Medications[1]  Past Medical History: Past Medical History:  Diagnosis Date   AAA (abdominal aortic aneurysm) 03/2014   repaired 2020   Ascending aortic aneurysm    unrepaired. Stable at 4.3 cm on imaging by [facility] thoracic surg 10/2019 and 10/2020.   Bilateral renal cysts    simple (03/2014 MRI)   CAD (coronary artery disease)    Cholelithiases 2018   asymptomatic   Chronic diastolic heart failure (HCC)    Chronic renal insufficiency, stage 3 (moderate) 2022   baseline sCr 1.3-1.5 (GFR 55)   COVID-19 virus infection 02/2020   sotrovimab  infusion   Diabetes mellitus with complication (HCC) 07/2014   A1c 6.8%. A1c 5.4% 11/2019   Diverticulosis    Gout    initially dx'd by diag arthrocentesis of elbow effusion in winter 2021, 3 episodes, all came after being started on chlorthalidone ->I d/c'd chlorthal.   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  Improving + off oxygen  as of 3 mo s/p liver transplant.  Doing well/resolved as of 02/2020 [facility] pulm f/u->they'll continue to follow. No worsening on CT imaging at [facility] 03/2023   History of blood transfusion 2018 X 4 dates   Chronic GI blood loss of unknown location despite full GI endoscopic eval, etc   History of liver transplant (HCC) 07/2019   [facility]   Hyperkalemia    Lokelma    Hyperlipemia, mixed    elevated LFTs when on  statins.     Hypertension    Cr bump 04/01/16 so I changed benicar -hct to benicar  plain and added amlodipine  5 mg.   Increased prostate specific antigen (PSA) velocity 2021   0.11 to 4.77 from 2020 to 2021 (he got a liver transplant and was put on antirejection meds between these psa checks).   Iron  deficiency anemia 05/2016   Acute blood loss anemia: hospitalized, required transfusion x 3 U: colonoscopy and capsule study unrevealing.  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  W/u for ? hemolytic anemia to be pursued by hematologist as outpt.  Dr. Charlean, GI at [facility] following, too---he rec'd onc do bone marrow bx as of Jan 2019   Iron  deficiency anemia due to chronic blood loss 2018/19   GI: transfusions x >20 required; multiple endoscopies and bleeding scans unrevealing. Lysteda  and octreotide  + monthly iron  infusions as of 04/2017. Iron  infusions changed to every other month as of 09/2018 hem f/u.   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  Changes noted on all CXRs in 2018/2019.  Liver Transplant  eval 03/2018->mild restriction on PFTs but no obstruction.  See further details in [facility] pulm fibrosis section   Microscopic hematuria    Eval unremarkable by Dr. Maryanne.   Nonmelanoma skin cancer 08/15/2018   2020 BCC nose, excised.  2022 R side of neck->mohs   Obesity    Pulmonary fibrosis (HCC)    PFTs: restrictive lung dz ([facility] Liver transplant eval 05/2018). [facility] pulm eval felt this was likely hepatopulmonary syndrome->causing his hypoxia->low likelihood of progression (as of 10/04/18 transplant clinic  f/u). Pulm rehab helping as of 2020/2021. OFF oxygen  as of 10/2019 transp f/u.   Pulmonary fibrosis (HCC)    Gradual worsening of objective testing 2020 05/2023, has had return of oxygen  requirement, diffuse parenchymal lung disease of unclear etiology.   Spleen enlarged     Tobacco Use: Tobacco Use History[2]  Labs: Review Flowsheet  More data exists      Latest Ref Rng & Units 04/20/2022 07/21/2022 10/24/2022 01/26/2023 12/15/2023  Labs for ITP Cardiac and Pulmonary Rehab  Cholestrol 0 - 200 mg/dL - 94  - 90  -  LDL (calc) 0 - 99 mg/dL - 41  - 40  -  HDL-C >60.99 mg/dL - 74.09  - 75.29  -  Trlycerides 0.0 - 149.0 mg/dL - 863.9  - 871.9  -  Hemoglobin A1c <5.7 % 7.8  7.8  7.8  7.8  5.5  5.5  5.5  5.5  5.7  5.7  5.7  5.7  5.6  5.6  5.6  5.6  6.6     Details       Multiple values from one day are sorted in reverse-chronological order         Capillary Blood Glucose: Lab Results  Component Value Date   GLUCAP 194 (H) 12/12/2023   GLUCAP 206 (H) 12/12/2023   GLUCAP 146 (H) 11/30/2023   GLUCAP 145 (H) 11/30/2023   GLUCAP 139 (H) 11/28/2023     Pulmonary Assessment Scores:  Pulmonary Assessment Scores     Row Name 11/20/23 0959         ADL UCSD   ADL Phase Entry     SOB Score total 88       CAT Score   CAT Score 18       mMRC Score   mMRC Score 4       UCSD: Self-administered rating of dyspnea associated with activities of daily living (ADLs) 6-point scale (0 = not at all to 5 = maximal or unable to do because of breathlessness)  Scoring Scores range from 0 to 120.  Minimally important difference is 5 units  CAT: CAT can identify the health impairment of COPD patients and is better correlated with disease progression.  CAT has a scoring range of zero to 40. The CAT score is classified into four groups of low (less than 10), medium (10 - 20), high (21-30) and very high (31-40) based on the impact level of disease on health status. A CAT score over 10  suggests significant symptoms.  A worsening CAT score could be explained by an exacerbation, poor medication adherence, poor inhaler technique, or progression of COPD or comorbid conditions.  CAT MCID is 2 points  mMRC: mMRC (Modified Medical Research Council) Dyspnea Scale is used to assess the degree of baseline functional disability in patients of respiratory disease due to dyspnea. No minimal important difference is established. A decrease in score of 1 point or greater is considered a positive change.   Pulmonary  Function Assessment:  Pulmonary Function Assessment - 11/20/23 0921       Breath   Bilateral Breath Sounds Clear    Shortness of Breath Yes;Limiting activity          Exercise Target Goals: Exercise Program Goal: Individual exercise prescription set using results from initial 6 min walk test and THRR while considering  patients activity barriers and safety.   Exercise Prescription Goal: Initial exercise prescription builds to 30-45 minutes a day of aerobic activity, 2-3 days per week.  Home exercise guidelines will be given to patient during program as part of exercise prescription that the participant will acknowledge.  Activity Barriers & Risk Stratification:  Activity Barriers & Cardiac Risk Stratification - 10/05/23 1534       Activity Barriers & Cardiac Risk Stratification   Activity Barriers Balance Concerns;Joint Problems;Arthritis;Deconditioning;Shortness of Breath;Neck/Spine Problems    Cardiac Risk Stratification High          6 Minute Walk:  6 Minute Walk     Row Name 08/29/23 1619 10/05/23 0915 11/20/23 1009     6 Minute Walk   Phase Initial  Used Go- Cart and O2 Initial Initial   Distance 330 feet 766 feet 625 feet   Walk Time 6 minutes 6 minutes 6 minutes   # of Rest Breaks 1  From 2:01 to 6:00 1  3:10-4:13 to increase O2 to 15L and get SaO2 to 90% 2  2:32, 5:40 increase O2 and NRB   MPH 0.63 1.5 1.18   METS 0.6 1.54 1.08   RPE 15 14 17     Perceived Dyspnea  3 3 4    VO2 Peak 2.2 5.4 3.77   Symptoms Yes (comment) Yes (comment) No   Comments Moderate to severe SOB, RPD = 3 Moderate to severe SOB, RPD = 3, resolved with rest --   Resting HR 66 bpm 61 bpm 67 bpm   Resting BP 110/64 138/72 112/62   Resting Oxygen  Saturation  95 %  4L O2 96 %  8L O2 94 %   Exercise Oxygen  Saturation  during 6 min walk 82 % 88 % 86 %   Max Ex. HR 80 bpm 83 bpm 77 bpm   Max Ex. BP 128/80 150/80 132/70   2 Minute Post BP 118/68 124/70 122/70     Interval HR   1 Minute HR 80 75 73   2 Minute HR 79 81 76   3 Minute HR 72 86 72   4 Minute HR 70 78 74   5 Minute HR 66 76 77   6 Minute HR 64 79 66   2 Minute Post HR 64 65 70   Interval Heart Rate? Yes Yes Yes     Interval Oxygen    Interval Oxygen ? Yes Yes Yes   Baseline Oxygen  Saturation % 95 % 96 %  8L 94 %   1 Minute Oxygen  Saturation % 95 % 96 % 95 %   1 Minute Liters of Oxygen  8 L 10 L 15 L   2 Minute Oxygen  Saturation % 83 %  Stopped walk test 91 % 90 %   2 Minute Liters of Oxygen  8 L 10 L 15 L  NRB   3 Minute Oxygen  Saturation % 82 %  At rest 88 % 93 %   3 Minute Liters of Oxygen  8 L 10 L 15 L  NRB   4 Minute Oxygen  Saturation % 86 %  AT rest 90 % 96 %  4 Minute Liters of Oxygen  8 L 15 L 15 L  NRB   5 Minute Oxygen  Saturation % 91 % 92 % 90 %  86% at 5:40   5 Minute Liters of Oxygen  8 L 15 L 15 L  increased to 25 NRB   6 Minute Oxygen  Saturation % 95 % 93 % 95 %   6 Minute Liters of Oxygen  8 L 15 L 25 L  NRB   2 Minute Post Oxygen  Saturation % 97 % 99 % 91 %   2 Minute Post Liters of Oxygen  8 L 8 L 10 L  NRB      Oxygen  Initial Assessment:  Oxygen  Initial Assessment - 11/20/23 0920       Home Oxygen    Home Oxygen  Device E-Tanks;Home Concentrator    Sleep Oxygen  Prescription Continuous    Liters per minute 5    Home Exercise Oxygen  Prescription Continuous    Liters per minute 15    Home Resting Oxygen  Prescription Continuous    Liters per minute 10    Compliance with  Home Oxygen  Use Yes      Initial 6 min Walk   Oxygen  Used Continuous    Liters per minute 15   NRB     Program Oxygen  Prescription   Program Oxygen  Prescription Continuous    Liters per minute 15   NRB mask     Intervention   Short Term Goals To learn and exhibit compliance with exercise, home and travel O2 prescription;To learn and understand importance of monitoring SPO2 with pulse oximeter and demonstrate accurate use of the pulse oximeter.;To learn and understand importance of maintaining oxygen  saturations>88%;To learn and demonstrate proper pursed lip breathing techniques or other breathing techniques. ;To learn and demonstrate proper use of respiratory medications    Long  Term Goals Exhibits compliance with exercise, home  and travel O2 prescription;Verbalizes importance of monitoring SPO2 with pulse oximeter and return demonstration;Maintenance of O2 saturations>88%;Exhibits proper breathing techniques, such as pursed lip breathing or other method taught during program session;Compliance with respiratory medication;Demonstrates proper use of MDIs          Oxygen  Re-Evaluation:  Oxygen  Re-Evaluation     Row Name 11/30/23 0843 12/29/23 1056 01/19/24 0941         Program Oxygen  Prescription   Program Oxygen  Prescription Continuous Continuous Continuous     Liters per minute 15  NRB mask 15  NRB mask 15     Comments -- -- 15L NRB and 6L HF       Home Oxygen    Home Oxygen  Device E-Tanks;Home Concentrator E-Tanks;Home Concentrator E-Tanks;Home Concentrator     Sleep Oxygen  Prescription Continuous Continuous Continuous     Liters per minute 5 5 5      Home Exercise Oxygen  Prescription Continuous Continuous Continuous     Liters per minute 15 15 15      Home Resting Oxygen  Prescription Continuous Continuous Continuous     Liters per minute 10 10 10      Compliance with Home Oxygen  Use Yes Yes Yes       Goals/Expected Outcomes   Short Term Goals To learn and exhibit compliance  with exercise, home and travel O2 prescription;To learn and understand importance of monitoring SPO2 with pulse oximeter and demonstrate accurate use of the pulse oximeter.;To learn and understand importance of maintaining oxygen  saturations>88%;To learn and demonstrate proper pursed lip breathing techniques or other breathing techniques. ;To learn and demonstrate proper use of respiratory medications To learn  and exhibit compliance with exercise, home and travel O2 prescription;To learn and understand importance of monitoring SPO2 with pulse oximeter and demonstrate accurate use of the pulse oximeter.;To learn and understand importance of maintaining oxygen  saturations>88%;To learn and demonstrate proper pursed lip breathing techniques or other breathing techniques. ;To learn and demonstrate proper use of respiratory medications To learn and exhibit compliance with exercise, home and travel O2 prescription;To learn and understand importance of monitoring SPO2 with pulse oximeter and demonstrate accurate use of the pulse oximeter.;To learn and understand importance of maintaining oxygen  saturations>88%;To learn and demonstrate proper pursed lip breathing techniques or other breathing techniques. ;To learn and demonstrate proper use of respiratory medications     Long  Term Goals Exhibits compliance with exercise, home  and travel O2 prescription;Verbalizes importance of monitoring SPO2 with pulse oximeter and return demonstration;Maintenance of O2 saturations>88%;Exhibits proper breathing techniques, such as pursed lip breathing or other method taught during program session;Compliance with respiratory medication;Demonstrates proper use of MDIs Exhibits compliance with exercise, home  and travel O2 prescription;Verbalizes importance of monitoring SPO2 with pulse oximeter and return demonstration;Maintenance of O2 saturations>88%;Exhibits proper breathing techniques, such as pursed lip breathing or other method  taught during program session;Compliance with respiratory medication;Demonstrates proper use of MDIs Exhibits compliance with exercise, home  and travel O2 prescription;Verbalizes importance of monitoring SPO2 with pulse oximeter and return demonstration;Maintenance of O2 saturations>88%;Exhibits proper breathing techniques, such as pursed lip breathing or other method taught during program session;Compliance with respiratory medication;Demonstrates proper use of MDIs     Goals/Expected Outcomes Compliance and understanding of oxygen  saturation monitoring and breathing techniques to decrease shortness of breath. Compliance and understanding of oxygen  saturation monitoring and breathing techniques to decrease shortness of breath. Compliance and understanding of oxygen  saturation monitoring and breathing techniques to decrease shortness of breath.        Oxygen  Discharge (Final Oxygen  Re-Evaluation):  Oxygen  Re-Evaluation - 01/19/24 0941       Program Oxygen  Prescription   Program Oxygen  Prescription Continuous    Liters per minute 15    Comments 15L NRB and 6L HF      Home Oxygen    Home Oxygen  Device E-Tanks;Home Concentrator    Sleep Oxygen  Prescription Continuous    Liters per minute 5    Home Exercise Oxygen  Prescription Continuous    Liters per minute 15    Home Resting Oxygen  Prescription Continuous    Liters per minute 10    Compliance with Home Oxygen  Use Yes      Goals/Expected Outcomes   Short Term Goals To learn and exhibit compliance with exercise, home and travel O2 prescription;To learn and understand importance of monitoring SPO2 with pulse oximeter and demonstrate accurate use of the pulse oximeter.;To learn and understand importance of maintaining oxygen  saturations>88%;To learn and demonstrate proper pursed lip breathing techniques or other breathing techniques. ;To learn and demonstrate proper use of respiratory medications    Long  Term Goals Exhibits compliance with  exercise, home  and travel O2 prescription;Verbalizes importance of monitoring SPO2 with pulse oximeter and return demonstration;Maintenance of O2 saturations>88%;Exhibits proper breathing techniques, such as pursed lip breathing or other method taught during program session;Compliance with respiratory medication;Demonstrates proper use of MDIs    Goals/Expected Outcomes Compliance and understanding of oxygen  saturation monitoring and breathing techniques to decrease shortness of breath.          Initial Exercise Prescription:  Initial Exercise Prescription - 11/20/23 1000       Date of Initial Exercise  RX and Referring Provider   Date 11/20/23    Referring Provider Dr. Almond Cone    Expected Discharge Date 02/27/24      Oxygen    Oxygen  Continuous   NRB   Liters 15    Maintain Oxygen  Saturation 88% or higher      NuStep   Level 1    SPM 72    Minutes 30    METs 1.7      Prescription Details   Frequency (times per week) 2    Duration Progress to 30 minutes of continuous aerobic without signs/symptoms of physical distress      Intensity   THRR 40-80% of Max Heartrate 58-117    Ratings of Perceived Exertion 11-13    Perceived Dyspnea 0-4      Progression   Progression Continue progressive overload as per policy without signs/symptoms or physical distress.      Resistance Training   Training Prescription Yes    Weight blue bands    Reps 10-15          Perform Capillary Blood Glucose checks as needed.  Exercise Prescription Changes:   Exercise Prescription Changes     Row Name 10/16/23 1029 10/30/23 1029 11/30/23 1148 12/19/23 1100 01/02/24 1200     Response to Exercise   Blood Pressure (Admit) 128/70 106/72 128/70 140/70 132/81   Blood Pressure (Exercise) 138/68 -- -- 130/70 130/64   Blood Pressure (Exit) 106/60 104/62 112/68 116/64 118/66   Heart Rate (Admit) 70 bpm 60 bpm 72 bpm 71 bpm 70 bpm   Heart Rate (Exercise) 85 bpm 74 bpm 76 bpm 74 bpm 74 bpm    Heart Rate (Exit) 73 bpm 65 bpm 64 bpm 75 bpm 69 bpm   Oxygen  Saturation (Admit) 98 % 95 % 97 % 99 % 100 %  15L HFNC   Oxygen  Saturation (Exercise) 95 % 96 % 94 % 97 % 91 %  15L NRB   Oxygen  Saturation (Exit) 99 % 100 % 100 % 97 % 100 %  15L NRB   Rating of Perceived Exertion (Exercise) 13 12 13 13 12    Perceived Dyspnea (Exercise) 0 0 2 2 2    Symptoms None None -- -- --   Comments Off to a good start with exercise. [place] completed cardiac rehab. -- -- --   Duration Continue with 30 min of aerobic exercise without signs/symptoms of physical distress. Continue with 30 min of aerobic exercise without signs/symptoms of physical distress. Continue with 30 min of aerobic exercise without signs/symptoms of physical distress. Continue with 30 min of aerobic exercise without signs/symptoms of physical distress. Continue with 30 min of aerobic exercise without signs/symptoms of physical distress.   Intensity THRR unchanged THRR unchanged THRR unchanged THRR unchanged THRR unchanged     Progression   Progression Continue to progress workloads to maintain intensity without signs/symptoms of physical distress. Continue to progress workloads to maintain intensity without signs/symptoms of physical distress. Continue to progress workloads to maintain intensity without signs/symptoms of physical distress. Continue to progress workloads to maintain intensity without signs/symptoms of physical distress. Continue to progress workloads to maintain intensity without signs/symptoms of physical distress.   Average METs 2.1 1.8 -- -- --     Resistance Training   Training Prescription Yes Yes Yes Yes Yes   Weight 2 lbs 2 lbs blue bands blue bands blue bands   Reps 10-15 10-15 10-15 10-15 10-15   Time 5 Minutes 5 Minutes 10 Minutes 10 Minutes  10 Minutes     Interval Training   Interval Training No No -- No No     Oxygen    Oxygen  Continuous Continuous Continuous Continuous Continuous   Liters 15 15 --  NRB+8LHFNC 15   NRB 15  HFNC or NRB mask     NuStep   Level 1 1 1 1 2    SPM 79 71 78 84 82   Minutes 30 30 30 30 15    METs 2.1 1.8 1.7 1.6 1.8     Home Exercise Plan   Plans to continue exercise at -- Home (comment)  Walking -- -- --   Frequency -- Add 3 additional days to program exercise sessions. -- -- --   Initial Home Exercises Provided -- 10/25/23 -- -- --     Oxygen    Maintain Oxygen  Saturation -- 88% or higher 88% or higher 88% or higher 88% or higher    Row Name 01/16/24 1100 01/23/24 0829           Response to Exercise   Blood Pressure (Admit) 140/70 120/78      Blood Pressure (Exercise) 124/60 --      Blood Pressure (Exit) 104/60 100/56      Heart Rate (Admit) 86 bpm 78 bpm      Heart Rate (Exercise) 78 bpm 81 bpm      Heart Rate (Exit) 75 bpm 77 bpm      Oxygen  Saturation (Admit) 91 % 98 %      Oxygen  Saturation (Exercise) 94 % 93 %      Oxygen  Saturation (Exit) 97 % 93 %      Rating of Perceived Exertion (Exercise) 13 13      Perceived Dyspnea (Exercise) 2 2      Duration Continue with 30 min of aerobic exercise without signs/symptoms of physical distress. Continue with 30 min of aerobic exercise without signs/symptoms of physical distress.      Intensity THRR unchanged THRR unchanged        Progression   Progression Continue to progress workloads to maintain intensity without signs/symptoms of physical distress. Continue to progress workloads to maintain intensity without signs/symptoms of physical distress.        Resistance Training   Training Prescription Yes --      Weight blue bands blue bands      Reps 10-15 10-15      Time 10 Minutes 10 Minutes        Oxygen    Oxygen  Continuous Continuous      Liters --  15L+NRB mask 15L NRB mask+ 6L        NuStep   Level 2 2      SPM 80 83      Minutes 15 15      METs 1.7 1.7        Oxygen    Maintain Oxygen  Saturation 88% or higher 88% or higher         Exercise Comments:   Exercise Comments     Row Name 10/16/23  1135 10/25/23 1111 10/30/23 1130 11/28/23 1205     Exercise Comments Charly tolerated low intensity exercise well without symptoms. SaO2 maintained on 15 L O2. Reviewed home exercise guidelines and goals with Riaz. Jordanny completed cardiac rehab and will continue walking at home at this time. Pt completed first day of group exercise. He struggled keeping his SpO2 up and needed NRB and HF [place] 8L. He exercised on the recumbent stepper for 30 min, level  1, METs 1.5. He performed warm up and cool down intermittently sitting. Used red bands. Did not discuss METs today but will in the future.       Exercise Goals and Review:   Exercise Goals     Row Name 08/29/23 1625 10/05/23 1534 11/20/23 0922         Exercise Goals   Increase Physical Activity Yes Yes Yes     Intervention Provide advice, education, support and counseling about physical activity/exercise needs.;Develop an individualized exercise prescription for aerobic and resistive training based on initial evaluation findings, risk stratification, comorbidities and participant's personal goals. Provide advice, education, support and counseling about physical activity/exercise needs.;Develop an individualized exercise prescription for aerobic and resistive training based on initial evaluation findings, risk stratification, comorbidities and participant's personal goals. Provide advice, education, support and counseling about physical activity/exercise needs.;Develop an individualized exercise prescription for aerobic and resistive training based on initial evaluation findings, risk stratification, comorbidities and participant's personal goals.     Expected Outcomes Short Term: Attend rehab on a regular basis to increase amount of physical activity.;Long Term: Add in home exercise to make exercise part of routine and to increase amount of physical activity.;Long Term: Exercising regularly at least 3-5 days a week. Short Term: Attend rehab on a regular  basis to increase amount of physical activity.;Long Term: Add in home exercise to make exercise part of routine and to increase amount of physical activity.;Long Term: Exercising regularly at least 3-5 days a week. Short Term: Attend rehab on a regular basis to increase amount of physical activity.;Long Term: Add in home exercise to make exercise part of routine and to increase amount of physical activity.;Long Term: Exercising regularly at least 3-5 days a week.     Increase Strength and Stamina Yes -- Yes     Intervention Provide advice, education, support and counseling about physical activity/exercise needs.;Develop an individualized exercise prescription for aerobic and resistive training based on initial evaluation findings, risk stratification, comorbidities and participant's personal goals. Provide advice, education, support and counseling about physical activity/exercise needs.;Develop an individualized exercise prescription for aerobic and resistive training based on initial evaluation findings, risk stratification, comorbidities and participant's personal goals. Provide advice, education, support and counseling about physical activity/exercise needs.;Develop an individualized exercise prescription for aerobic and resistive training based on initial evaluation findings, risk stratification, comorbidities and participant's personal goals.     Expected Outcomes Short Term: Increase workloads from initial exercise prescription for resistance, speed, and METs.;Short Term: Perform resistance training exercises routinely during rehab and add in resistance training at home;Long Term: Improve cardiorespiratory fitness, muscular endurance and strength as measured by increased METs and functional capacity ( ) Short Term: Increase workloads from initial exercise prescription for resistance, speed, and METs.;Short Term: Perform resistance training exercises routinely during rehab and add in resistance training  at home;Long Term: Improve cardiorespiratory fitness, muscular endurance and strength as measured by increased METs and functional capacity ( ) Short Term: Increase workloads from initial exercise prescription for resistance, speed, and METs.;Short Term: Perform resistance training exercises routinely during rehab and add in resistance training at home;Long Term: Improve cardiorespiratory fitness, muscular endurance and strength as measured by increased METs and functional capacity ( )     Able to understand and use rate of perceived exertion (RPE) scale Yes Yes Yes     Intervention Provide education and explanation on how to use RPE scale Provide education and explanation on how to use RPE scale Provide education and explanation on how to use RPE  scale     Expected Outcomes Short Term: Able to use RPE daily in rehab to express subjective intensity level;Long Term:  Able to use RPE to guide intensity level when exercising independently Short Term: Able to use RPE daily in rehab to express subjective intensity level;Long Term:  Able to use RPE to guide intensity level when exercising independently Short Term: Able to use RPE daily in rehab to express subjective intensity level;Long Term:  Able to use RPE to guide intensity level when exercising independently     Able to understand and use Dyspnea scale Yes Yes Yes     Intervention Provide education and explanation on how to use Dyspnea scale Provide education and explanation on how to use Dyspnea scale Provide education and explanation on how to use Dyspnea scale     Expected Outcomes Short Term: Able to use Dyspnea scale daily in rehab to express subjective sense of shortness of breath during exertion;Long Term: Able to use Dyspnea scale to guide intensity level when exercising independently Short Term: Able to use Dyspnea scale daily in rehab to express subjective sense of shortness of breath during exertion;Long Term: Able to use Dyspnea scale to guide  intensity level when exercising independently Short Term: Able to use Dyspnea scale daily in rehab to express subjective sense of shortness of breath during exertion;Long Term: Able to use Dyspnea scale to guide intensity level when exercising independently     Knowledge and understanding of Target Heart Rate Range (THRR) Yes Yes Yes     Intervention Provide education and explanation of THRR including how the numbers were predicted and where they are located for reference Provide education and explanation of THRR including how the numbers were predicted and where they are located for reference Provide education and explanation of THRR including how the numbers were predicted and where they are located for reference     Expected Outcomes Short Term: Able to state/look up THRR;Long Term: Able to use THRR to govern intensity when exercising independently;Short Term: Able to use daily as guideline for intensity in rehab Short Term: Able to state/look up THRR;Long Term: Able to use THRR to govern intensity when exercising independently;Short Term: Able to use daily as guideline for intensity in rehab Short Term: Able to state/look up THRR;Long Term: Able to use THRR to govern intensity when exercising independently;Short Term: Able to use daily as guideline for intensity in rehab     Understanding of Exercise Prescription Yes Yes Yes     Intervention Provide education, explanation, and written materials on patient's individual exercise prescription Provide education, explanation, and written materials on patient's individual exercise prescription Provide education, explanation, and written materials on patient's individual exercise prescription     Expected Outcomes Short Term: Able to explain program exercise prescription;Long Term: Able to explain home exercise prescription to exercise independently Short Term: Able to explain program exercise prescription;Long Term: Able to explain home exercise prescription to  exercise independently Short Term: Able to explain program exercise prescription;Long Term: Able to explain home exercise prescription to exercise independently        Exercise Goals Re-Evaluation :  Exercise Goals Re-Evaluation     Row Name 10/16/23 1135 10/25/23 1111 10/30/23 1130 11/30/23 0841 12/29/23 1053     Exercise Goal Re-Evaluation   Exercise Goals Review Increase Physical Activity;Increase Strength and Stamina;Able to understand and use Dyspnea scale;Able to understand and use rate of perceived exertion (RPE) scale Increase Physical Activity;Increase Strength and Stamina;Able to understand and use Dyspnea  scale;Able to understand and use rate of perceived exertion (RPE) scale;Able to check pulse independently;Knowledge and understanding of Target Heart Rate Range (THRR);Understanding of Exercise Prescription Increase Physical Activity;Increase Strength and Stamina;Able to understand and use Dyspnea scale;Able to understand and use rate of perceived exertion (RPE) scale;Able to check pulse independently;Knowledge and understanding of Target Heart Rate Range (THRR);Understanding of Exercise Prescription Increase Physical Activity;Increase Strength and Stamina;Able to understand and use Dyspnea scale;Able to understand and use rate of perceived exertion (RPE) scale;Able to check pulse independently;Knowledge and understanding of Target Heart Rate Range (THRR);Understanding of Exercise Prescription Increase Physical Activity;Increase Strength and Stamina;Able to understand and use Dyspnea scale;Able to understand and use rate of perceived exertion (RPE) scale;Able to check pulse independently;Knowledge and understanding of Target Heart Rate Range (THRR);Understanding of Exercise Prescription   Comments Tranquilino was able to understand and use the RPE scale appropriately. He has a pulse oximeter to monitor his oxygen  saturation and pulse. Reviewed exercise prescription with Jaxon. Jkai completed the  cardiac rehab program and will continue walking at home. He plans to enroll in pulmonary rehab once his wife recovers from upcoming surgery. Ballard has completed 1 exercise session. He exercises for 30 min on the Nustep. He averages 1.5 METs at level 1 on the Nustep. He performs the warmup and cooldown standing dependent on his shortness of breath/ fatigue. It is too soon to notate any discernable progressions. Will continue to monitor and progress as able. Beryl has completed 7 exercise session. He exercises for 30 min on the Nustep. He averages 1.7 METs at level 2 on the Nustep. He performs the warmup and cooldown standing dependent on his shortness of breath/ fatigue. Etienne is very decondioned. He has also had a recent hospitalization. It has been difficult to progress Bence due to this. Will continue to monitor and progress as able.   Expected Outcomes Jonnathan will exercise on telemetry monitor for 5 session, then start the pulmonary rehab program. Vada will walk 10-30 minutes, 2-3 days/week as tolerated. Edahi will conitnuing walking 10-30 minutes, 2-3 days/week as tolerated. He will continue exercise in pulmonary rehab. Through exercise at rehab and home, the patient will decrease shortness of breath with daily activities and feel confident in carrying out an exercise regimen at home Through exercise at rehab and home, the patient will decrease shortness of breath with daily activities and feel confident in carrying out an exercise regimen at home    Row Name 01/19/24 0938             Exercise Goal Re-Evaluation   Exercise Goals Review Increase Physical Activity;Increase Strength and Stamina;Able to understand and use Dyspnea scale;Able to understand and use rate of perceived exertion (RPE) scale;Able to check pulse independently;Knowledge and understanding of Target Heart Rate Range (THRR);Understanding of Exercise Prescription       Comments Estell has completed 12 exercise sessions. He exercises for  30 min on the Nustep. He averages 1.7 METs at level 2 on the Nustep. He performs the warmup and cooldown standing dependent on his shortness of breath/ fatigue. Cailan is still difficult to progress due to his need for high flow nasal cannula and nonrebreather mask. Will continue to monitor and progress as able.       Expected Outcomes Through exercise at rehab and home, the patient will decrease shortness of breath with daily activities and feel confident in carrying out an exercise regimen at home          Discharge Exercise Prescription (Final  Exercise Prescription Changes):  Exercise Prescription Changes - 01/23/24 0829       Response to Exercise   Blood Pressure (Admit) 120/78    Blood Pressure (Exit) 100/56    Heart Rate (Admit) 78 bpm    Heart Rate (Exercise) 81 bpm    Heart Rate (Exit) 77 bpm    Oxygen  Saturation (Admit) 98 %    Oxygen  Saturation (Exercise) 93 %    Oxygen  Saturation (Exit) 93 %    Rating of Perceived Exertion (Exercise) 13    Perceived Dyspnea (Exercise) 2    Duration Continue with 30 min of aerobic exercise without signs/symptoms of physical distress.    Intensity THRR unchanged      Progression   Progression Continue to progress workloads to maintain intensity without signs/symptoms of physical distress.      Resistance Training   Weight blue bands    Reps 10-15    Time 10 Minutes      Oxygen    Oxygen  Continuous    Liters 15L NRB mask+ 6L      NuStep   Level 2    SPM 83    Minutes 15    METs 1.7      Oxygen    Maintain Oxygen  Saturation 88% or higher          Nutrition:  Target Goals: Understanding of nutrition guidelines, daily intake of sodium 1500mg , cholesterol 200mg , calories 30% from fat and 7% or less from saturated fats, daily to have 5 or more servings of fruits and vegetables.  Biometrics:  Pre Biometrics - 11/20/23 0910       Pre Biometrics   Grip Strength 29 kg           Nutrition Therapy Plan and Nutrition  Goals:   Nutrition Assessments:  MEDIFICTS Score Key: >=70 Need to make dietary changes  40-70 Heart Healthy Diet <= 40 Therapeutic Level Cholesterol Diet  Flowsheet Row INTENSIVE CARDIAC REHAB ORIENT from [facility]/05/2023 in [facility]  Picture Your Plate Total Score on Admission 64   Picture Your Plate Scores: <59 Unhealthy dietary pattern with much room for improvement. 41-50 Dietary pattern unlikely to meet recommendations for good health and room for improvement. 51-60 More healthful dietary pattern, with some room for improvement.  >60 Healthy dietary pattern, although there may be some specific behaviors that could be improved.    Nutrition Goals Re-Evaluation:   Nutrition Goals Discharge (Final Nutrition Goals Re-Evaluation):   Psychosocial: Target Goals: Acknowledge presence or absence of significant depression and/or stress, maximize coping skills, provide positive support system. Participant is able to verbalize types and ability to use techniques and skills needed for reducing stress and depression.  Initial Review & Psychosocial Screening:  Initial Psych Review & Screening - 11/20/23 0918       Initial Review   Current issues with None Identified      Family Dynamics   Good Support System? Yes   Kamsiyochukwu has his wife, 3 children and 3 grandchildren for support   Comments wife, 3 children, 3 grandchildren      Barriers   Psychosocial barriers to participate in program There are no identifiable barriers or psychosocial needs.      Screening Interventions   Interventions Encouraged to exercise          Quality of Life Scores:  Quality of Life - 10/05/23 1536       Quality of Life Scores  Health/Function Pre 22.23 %    Socioeconomic Pre 23.58 %    Psych/Spiritual Pre 27.14 %    Family Pre 27 %    GLOBAL Pre 24.24 %         Scores of 19 and below usually indicate a poorer quality of life in  these areas.  A difference of  2-3 points is a clinically meaningful difference.  A difference of 2-3 points in the total score of the Quality of Life Index has been associated with significant improvement in overall quality of life, self-image, physical symptoms, and general health in studies assessing change in quality of life.  PHQ-9: Review Flowsheet  More data exists      11/20/2023 10/05/2023 08/29/2023 08/02/2023 07/06/2023  Depression screen PHQ 2/9  Decreased Interest 1 2 2  0 0  Down, Depressed, Hopeless 0 0 0 0 0  PHQ - 2 Score 1 2 2  0 0  Altered sleeping 0 0 0 0 -  Tired, decreased energy 1 2 2  0 -  Change in appetite 0 0 0 0 -  Feeling bad or failure about yourself  0 0 0 0 -  Trouble concentrating 0 0 0 0 -  Moving slowly or fidgety/restless 0 0 0 0 -  Suicidal thoughts 0 0 0 0 -  PHQ-9 Score 2  4  4   0  -  Difficult doing work/chores Not difficult at all Not difficult at all Not difficult at all Not difficult at all -    Details       Data saved with a previous flowsheet row definition        Interpretation of Total Score  Total Score Depression Severity:  1-4 = Minimal depression, 5-9 = Mild depression, 10-14 = Moderate depression, 15-19 = Moderately severe depression, 20-27 = Severe depression   Psychosocial Evaluation and Intervention:  Psychosocial Evaluation - 11/20/23 0919       Psychosocial Evaluation & Interventions   Interventions Encouraged to exercise with the program and follow exercise prescription    Comments No barriers or concerns were identified at this time.    Expected Outcomes For Jaeveon to continue to be free of barriers or psychosocial concerns while participating in pulmonary rehab.    Continue Psychosocial Services  No Follow up required          Psychosocial Re-Evaluation:  Psychosocial Re-Evaluation     Row Name 11/27/23 1308 12/27/23 1031 01/26/24 0917         Psychosocial Re-Evaluation   Current issues with None Identified  None Identified None Identified     Comments Neo is scheduled to start PR on 11/28/23. No new psy/sco barriers or concerns since orientation. Monthly psy/soc re-eval is as follows: Velton denies any new psy/soc barriers or concerns at this time. He comes to class with a good attitude and works hard in the time he is here. No needs at this time. Monthly psy/soc re-eval is as follows: Suren continues to deny any new psy/soc barriers or concerns at this time. He has good support from his friends and family. No needs at this time.     Expected Outcomes For Nolawi to participate in PR free of any psy/soc barriers or concerns For Reinhardt to participate in PR free of any psy/soc barriers or concerns For Ransome to participate in PR free of any psy/soc barriers or concerns     Interventions Encouraged to attend Pulmonary Rehabilitation for the exercise Encouraged to attend Pulmonary Rehabilitation for the  exercise Encouraged to attend Pulmonary Rehabilitation for the exercise     Continue Psychosocial Services  No Follow up required No Follow up required No Follow up required        Psychosocial Discharge (Final Psychosocial Re-Evaluation):  Psychosocial Re-Evaluation - 01/26/24 0917       Psychosocial Re-Evaluation   Current issues with None Identified    Comments Monthly psy/soc re-eval is as follows: Jef continues to deny any new psy/soc barriers or concerns at this time. He has good support from his friends and family. No needs at this time.    Expected Outcomes For Jaeshaun to participate in PR free of any psy/soc barriers or concerns    Interventions Encouraged to attend Pulmonary Rehabilitation for the exercise    Continue Psychosocial Services  No Follow up required          Education: Education Goals: Education classes will be provided on a weekly basis, covering required topics. Participant will state understanding/return demonstration of topics presented.  Learning Barriers/Preferences:   Learning Barriers/Preferences - 11/20/23 0919       Learning Barriers/Preferences   Learning Barriers Hearing;Sight    Learning Preferences Skilled Demonstration;Group Instruction;Individual Instruction          Education Topics: Know Your Numbers Group instruction that is supported by a PowerPoint presentation. Instructor discusses importance of knowing and understanding resting, exercise, and post-exercise oxygen  saturation, heart rate, and blood pressure. Oxygen  saturation, heart rate, blood pressure, rating of perceived exertion, and dyspnea are reviewed along with a normal range for these values.    Exercise for the Pulmonary Patient Group instruction that is supported by a PowerPoint presentation. Instructor discusses benefits of exercise, core components of exercise, frequency, duration, and intensity of an exercise routine, importance of utilizing pulse oximetry during exercise, safety while exercising, and options of places to exercise outside of rehab.  Flowsheet Row PULMONARY REHAB OTHER RESPIRATORY from 01/18/2024 in [facility]  Date 01/18/24  Educator EP  Instruction Review Code 1- Verbalizes Understanding    MET Level  Group instruction provided by PowerPoint, verbal discussion, and written material to support subject matter. Instructor reviews what METs are and how to increase METs.  Flowsheet Row PULMONARY REHAB OTHER RESPIRATORY from 12/14/2023 in [facility], [facility], & [facility]  Date 12/14/23  Educator EP  Instruction Review Code 1- Verbalizes Understanding    Pulmonary Medications Verbally interactive group education provided by instructor with focus on inhaled medications and proper administration. Flowsheet Row PULMONARY REHAB OTHER RESPIRATORY from 01/11/2024 in [facility] for [facility], Vascular, & Lung [facility]  Date 01/11/24  Educator RT   Instruction Review Code 1- Verbalizes Understanding    Anatomy and Physiology of the Respiratory System Group instruction provided by PowerPoint, verbal discussion, and written material to support subject matter. Instructor reviews respiratory cycle and anatomical components of the respiratory system and their functions. Instructor also reviews differences in obstructive and restrictive respiratory diseases with examples of each.  Flowsheet Row PULMONARY REHAB OTHER RESPIRATORY from 12/28/2023 in [facility] for [facility], Vascular, & Lung [facility]  Date 12/28/23  Educator RT  Instruction Review Code 1- Verbalizes Understanding    Oxygen  Safety Group instruction provided by PowerPoint, verbal discussion, and written material to support subject matter. There is an overview of What is Oxygen  and Why do we need it.  Instructor also reviews how to create a safe  environment for oxygen  use, the importance of using oxygen  as prescribed, and the risks of noncompliance. There is a brief discussion on traveling with oxygen  and resources the patient may utilize.   Oxygen  Use Group instruction provided by PowerPoint, verbal discussion, and written material to discuss how supplemental oxygen  is prescribed and different types of oxygen  supply systems. Resources for more information are provided.    Breathing Techniques Group instruction that is supported by demonstration and informational handouts. Instructor discusses the benefits of pursed lip and diaphragmatic breathing and detailed demonstration on how to perform both.     Risk Factor Reduction Group instruction that is supported by a PowerPoint presentation. Instructor discusses the definition of a risk factor, different risk factors for pulmonary disease, and how the heart and lungs work together. Flowsheet Row PULMONARY REHAB OTHER RESPIRATORY from 01/29/2019 in [facility], Vascular, & Lung  [facility]  Date 01/08/19  Educator --  [Handout]    Pulmonary Diseases Group instruction provided by PowerPoint, verbal discussion, and written material to support subject matter. Instructor gives an overview of the different type of pulmonary diseases. There is also a discussion on risk factors and symptoms as well as ways to manage the diseases. Flowsheet Row PULMONARY REHAB OTHER RESPIRATORY from 12/21/2023 in [facility] for Heart, Vascular, & Lung Health  Date 12/21/23  Educator RT  Instruction Review Code 1- Verbalizes Understanding    Stress and Energy Conservation Group instruction provided by PowerPoint, verbal discussion, and written material to support subject matter. Instructor gives an overview of stress and the impact it can have on the body. Instructor also reviews ways to reduce stress. There is also a discussion on energy conservation and ways to conserve energy throughout the day.   Warning Signs and Symptoms Group instruction provided by PowerPoint, verbal discussion, and written material to support subject matter. Instructor reviews warning signs and symptoms of stroke, heart attack, cold and flu. Instructor also reviews ways to prevent the spread of infection. Flowsheet Row PULMONARY REHAB OTHER RESPIRATORY from 11/30/2023 in [facility] for Heart, Vascular, & Lung Health  Date 11/30/23  Educator RN  Instruction Review Code 1- Verbalizes Understanding    Other Education Group or individual verbal, written, or video instructions that support the educational goals of the pulmonary rehab program. Flowsheet Row PULMONARY REHAB OTHER RESPIRATORY from 11/07/2019 in [facility] for Heart, Vascular, & Lung Health  Date 11/07/19  [name] your numbers]  Educator Handout  Instruction Review Code 2- Demonstrated Understanding     Knowledge Questionnaire Score:  Knowledge Questionnaire Score - 11/20/23 0928        Knowledge Questionnaire Score   Pre Score 15//18          Core Components/Risk Factors/Patient Goals at Admission:  Personal Goals and Risk Factors at Admission - 11/20/23 0919       Core Components/Risk Factors/Patient Goals on Admission   Improve shortness of breath with ADL's Yes    Intervention Provide education, individualized exercise plan and daily activity instruction to help decrease symptoms of SOB with activities of daily living.    Expected Outcomes Short Term: Improve cardiorespiratory fitness to achieve a reduction of symptoms when performing ADLs;Long Term: Be able to perform more ADLs without symptoms or delay the onset of symptoms          Core Components/Risk Factors/Patient Goals Review:   Goals and Risk Factor Review  Row Name 11/27/23 1310 12/27/23 1035 01/26/24 0918         Core Components/Risk Factors/Patient Goals Review   Personal Goals Review Improve shortness of breath with ADL's;Develop more efficient breathing techniques such as purse lipped breathing and diaphragmatic breathing and practicing self-pacing with activity. Improve shortness of breath with ADL's;Develop more efficient breathing techniques such as purse lipped breathing and diaphragmatic breathing and practicing self-pacing with activity. Improve shortness of breath with ADL's;Develop more efficient breathing techniques such as purse lipped breathing and diaphragmatic breathing and practicing self-pacing with activity.     Review Monthly review of patients Core Components/Risk Factors/Patient Goals are as follows: Vickie has not started PR yet. Unable to asses his goals at this time. Monthly review of patients Core Components/Risk Factors/Patient Goals are as follows: Goal progressing for improving shortness of breath. Almando is currently using NRB mask while exercising to keep sats >88%. He is currently exercising on the Nustep for 30 minutes. Goal progressing for developing more  efficient breathing techniques such as purse lipped breathing and diaphragmatic breathing; and practicing self-pacing with activity. We will continue to monitor his progress throughout the program. Monthly review of patients Core Components/Risk Factors/Patient Goals are as follows: Goal progressing for improving shortness of breath. Bryceson is currently using NRB mask+ 6L HFNC while exercising to keep sats >88%. He is currently exercising on the Nustep for 30 minutes. Goal met for developing more efficient breathing techniques such as purse lipped breathing and diaphragmatic breathing; and practicing self-pacing with activity. Oronde is able to demonstrate PLB when he becomes short of breath. He also knows how to self pace based on his RPE/dyspnea scores. We will continue to monitor his progress throughout the program.     Expected Outcomes To improve shortness of breath with ADL's and develop more efficient breathing techniques such as purse lipped breathing and diaphragmatic breathing; and practicing self-pacing with activity. To improve shortness of breath with ADL's and develop more efficient breathing techniques such as purse lipped breathing and diaphragmatic breathing; and practicing self-pacing with activity. To improve shortness of breath with ADL's        Core Components/Risk Factors/Patient Goals at Discharge (Final Review):   Goals and Risk Factor Review - 01/26/24 0918       Core Components/Risk Factors/Patient Goals Review   Personal Goals Review Improve shortness of breath with ADL's;Develop more efficient breathing techniques such as purse lipped breathing and diaphragmatic breathing and practicing self-pacing with activity.    Review Monthly review of patients Core Components/Risk Factors/Patient Goals are as follows: Goal progressing for improving shortness of breath. Dannis is currently using NRB mask+ 6L HFNC while exercising to keep sats >88%. He is currently exercising on the Nustep  for 30 minutes. Goal met for developing more efficient breathing techniques such as purse lipped breathing and diaphragmatic breathing; and practicing self-pacing with activity. Ridley is able to demonstrate PLB when he becomes short of breath. He also knows how to self pace based on his RPE/dyspnea scores. We will continue to monitor his progress throughout the program.    Expected Outcomes To improve shortness of breath with ADL's          ITP Comments:  ITP Comments     Row Name 08/29/23 1534 10/05/23 0756 10/16/23 1638       ITP Comments Wilbert Bihari, MD: Medical Director. Introduction to the [facility]. Initial oriention packet reviewed with the patient. Wilbert Bihari, MD: Medical Director. Introduction to  the [facility]. Initial oriention packet reviewed with the patient. Patient brought back for 6 minute walk test today 30-Day ITP review. Terique started the cardiac rehab program today and will attend 5 sessions and transfer to the pulmonary rehab program. Oxygen  saturation was maintained in the mid to upper 90s on 15L O2.        Comments: Pt is making expected progress toward Pulmonary Rehab goals after completing 13 session(s). Recommend continued exercise, life style modification, education, and utilization of breathing techniques to increase stamina and strength, while also decreasing shortness of breath with exertion.  Dr. Slater Staff is Medical Director for Pulmonary Rehab at [facility].       [1]  Current Outpatient Medications:    acetaminophen  (TYLENOL ) 500 MG tablet, Take 1,000 mg by mouth as needed., Disp: , Rfl:    acitretin  (SORIATANE ) 10 MG capsule, Take 1 capsule by mouth daily, Disp: 30 capsule, Rfl: 5   amLODipine  (NORVASC ) 10 MG tablet, Take 1 tablet (10 mg total) by mouth daily., Disp: 90 tablet, Rfl: 3   ammonium lactate  (AMLACTIN) 12 % cream, Apply 2 times daily to  forearms and hands in preparation for treatment with Efudex  (fluorouracil ) (Patient taking differently: Apply 1 Application topically as needed for dry skin.), Disp: 385 g, Rfl: 99   amoxicillin  (AMOXIL ) 500 MG capsule, Prophylaxis for dental procedure. Take 2 grams (4x 500mg  tablets) 1 hour before procedure., Disp: 4 capsule, Rfl: 0   aspirin EC 81 MG tablet, Take 81 mg by mouth daily. Swallow whole., Disp: , Rfl:    furosemide  (LASIX ) 20 MG tablet, Take 2 tablets (40 mg total) by mouth once daily, Disp: 60 tablet, Rfl: 0   metFORMIN  (GLUCOPHAGE ) 500 MG tablet, Take 1 tablet (500 mg total) by mouth 2 (two) times daily with a meal., Disp: 180 tablet, Rfl: 3   metoprolol  tartrate (LOPRESSOR ) 100 MG tablet, Take 1 tablet (100 mg total) by mouth 2 (two) times daily., Disp: 180 tablet, Rfl: 1   mycophenolate  (CELLCEPT ) 250 MG capsule, Take 2 capsules by mouth in the morning and 1 capsule in the evening., Disp: 270 capsule, Rfl: 3   pantoprazole  (PROTONIX ) 40 MG tablet, Take 1 tablet (40 mg total) by mouth daily., Disp: 90 tablet, Rfl: 3   predniSONE  (DELTASONE ) 10 MG tablet, Take 2 tablets (20 mg total) by mouth daily., Disp: 30 tablet, Rfl: 1   rosuvastatin  (CRESTOR ) 10 MG tablet, Take 1 tablet (10 mg total) by mouth daily., Disp: 90 tablet, Rfl: 3   Specialty Vitamins Products (MG PLUS PROTEIN) 133 MG TABS, Take 1 tablet (133 mg total) by mouth 2 (two) times daily at lunch and dinner, Disp: 60 tablet, Rfl: 11   tacrolimus  (PROGRAF ) 1 MG capsule, Take 2 capsules (2 mg total) by mouth every morning AND 1 capsule (1 mg total) every evening., Disp: 270 capsule, Rfl: 3   triamcinolone  cream (KENALOG ) 0.1 %, Apply 2 times a day to rash. Follow with CeraVe Cream.  Stop when smooth., Disp: 80 g, Rfl: 2 No current facility-administered medications for this encounter.  Facility-Administered Medications Ordered in Other Encounters:    heparin  lock flush 100 unit/mL, 500 Units, Intracatheter, Daily PRN, Lanny Callander,  MD   sodium chloride  flush (NS) 0.9 % injection 10 mL, 10 mL, Intracatheter, PRN, Lanny Callander, MD [2]  Social History Tobacco Use  Smoking Status Former   Current packs/day: 0.00   Average packs/day: 0.5 packs/day for 17.0 years (8.5  ttl pk-yrs)   Types: Cigarettes   Start date: 71   Quit date: 18   Years since quitting: 43.0  Smokeless Tobacco Never  Tobacco Comments   QUIT I 1983

## 2024-02-06 ENCOUNTER — Encounter (HOSPITAL_COMMUNITY): Admission: RE | Admit: 2024-02-06 | Source: Ambulatory Visit

## 2024-02-13 ENCOUNTER — Encounter: Payer: Self-pay | Admitting: Family Medicine

## 2024-02-13 ENCOUNTER — Other Ambulatory Visit (HOSPITAL_COMMUNITY): Payer: Self-pay

## 2024-02-13 ENCOUNTER — Encounter (HOSPITAL_COMMUNITY): Admission: RE | Admit: 2024-02-13 | Source: Ambulatory Visit

## 2024-02-13 ENCOUNTER — Telehealth (HOSPITAL_COMMUNITY): Payer: Self-pay

## 2024-02-13 DIAGNOSIS — J849 Interstitial pulmonary disease, unspecified: Secondary | ICD-10-CM

## 2024-02-13 DIAGNOSIS — Z944 Liver transplant status: Secondary | ICD-10-CM

## 2024-02-13 MED ORDER — FUROSEMIDE 20 MG PO TABS
40.0000 mg | ORAL_TABLET | Freq: Every day | ORAL | 3 refills | Status: DC
  Filled 2024-02-13: qty 180, 90d supply, fill #0

## 2024-02-13 NOTE — Progress Notes (Signed)
 Discharge Progress Report  Patient Details  Name: William [name] MRN: [id number] Date of Birth: [birth year] Referring Provider:   [facility] Walk Test from 11/20/2023 in [facility]  Referring Provider Dr. Almond Cone     Number of Visits: 13  Reason for Discharge:  Early Exit:  increased deconditioning  Smoking History:  Tobacco Use History[1]  Diagnosis:  Idiopathic interstitial pneumonia, not otherwise specified [facility])  ADL UCSD:  Pulmonary Assessment Scores     Row Name 11/20/23 0959         ADL UCSD   ADL Phase Entry     SOB Score total 88       CAT Score   CAT Score 18       mMRC Score   mMRC Score 4        Initial Exercise Prescription:  Initial Exercise Prescription - 11/20/23 1000       Date of Initial Exercise RX and Referring Provider   Date 11/20/23    Referring Provider Dr. Almond Cone    Expected Discharge Date 02/27/24      Oxygen    Oxygen  Continuous   NRB   Liters 15    Maintain Oxygen  Saturation 88% or higher      NuStep   Level 1    SPM 72    Minutes 30    METs 1.7      Prescription Details   Frequency (times per week) 2    Duration Progress to 30 minutes of continuous aerobic without signs/symptoms of physical distress      Intensity   THRR 40-80% of Max Heartrate 58-117    Ratings of Perceived Exertion 11-13    Perceived Dyspnea 0-4      Progression   Progression Continue progressive overload as per policy without signs/symptoms or physical distress.      Resistance Training   Training Prescription Yes    Weight blue bands    Reps 10-15          Discharge Exercise Prescription (Final Exercise Prescription Changes):  Exercise Prescription Changes - 01/23/24 0829       Response to Exercise   Blood Pressure (Admit) 120/78    Blood Pressure (Exit) 100/56    Heart Rate (Admit) 78 bpm    Heart Rate (Exercise) 81 bpm    Heart Rate  (Exit) 77 bpm    Oxygen  Saturation (Admit) 98 %    Oxygen  Saturation (Exercise) 93 %    Oxygen  Saturation (Exit) 93 %    Rating of Perceived Exertion (Exercise) 13    Perceived Dyspnea (Exercise) 2    Duration Continue with 30 min of aerobic exercise without signs/symptoms of physical distress.    Intensity THRR unchanged      Progression   Progression Continue to progress workloads to maintain intensity without signs/symptoms of physical distress.      Resistance Training   Weight blue bands    Reps 10-15    Time 10 Minutes      Oxygen    Oxygen  Continuous    Liters 15L NRB mask+ 6L      NuStep   Level 2    SPM 83    Minutes 15    METs 1.7      Oxygen    Maintain Oxygen  Saturation 88% or higher          Functional Capacity:  6 Minute Walk  Row Name 08/29/23 1619 10/05/23 0915 11/20/23 1009     6 Minute Walk   Phase Initial  Used Go- Cart and O2 Initial Initial   Distance 330 feet 766 feet 625 feet   Walk Time 6 minutes 6 minutes 6 minutes   # of Rest Breaks 1  From 2:01 to 6:00 1  3:10-4:13 to increase O2 to 15L and get SaO2 to 90% 2  2:32, 5:40 increase O2 and NRB   MPH 0.63 1.5 1.18   METS 0.6 1.54 1.08   RPE 15 14 17    Perceived Dyspnea  3 3 4    VO2 Peak 2.2 5.4 3.77   Symptoms Yes (comment) Yes (comment) No   Comments Moderate to severe SOB, RPD = 3 Moderate to severe SOB, RPD = 3, resolved with rest --   Resting HR 66 bpm 61 bpm 67 bpm   Resting BP 110/64 138/72 112/62   Resting Oxygen  Saturation  95 %  4L O2 96 %  8L O2 94 %   Exercise Oxygen  Saturation  during 6 min walk 82 % 88 % 86 %   Max Ex. HR 80 bpm 83 bpm 77 bpm   Max Ex. BP 128/80 150/80 132/70   2 Minute Post BP 118/68 124/70 122/70     Interval HR   1 Minute HR 80 75 73   2 Minute HR 79 81 76   3 Minute HR 72 86 72   4 Minute HR 70 78 74   5 Minute HR 66 76 77   6 Minute HR 64 79 66   2 Minute Post HR 64 65 70   Interval Heart Rate? Yes Yes Yes     Interval Oxygen    Interval  Oxygen ? Yes Yes Yes   Baseline Oxygen  Saturation % 95 % 96 %  8L 94 %   1 Minute Oxygen  Saturation % 95 % 96 % 95 %   1 Minute Liters of Oxygen  8 L 10 L 15 L   2 Minute Oxygen  Saturation % 83 %  Stopped walk test 91 % 90 %   2 Minute Liters of Oxygen  8 L 10 L 15 L  NRB   3 Minute Oxygen  Saturation % 82 %  At rest 88 % 93 %   3 Minute Liters of Oxygen  8 L 10 L 15 L  NRB   4 Minute Oxygen  Saturation % 86 %  AT rest 90 % 96 %   4 Minute Liters of Oxygen  8 L 15 L 15 L  NRB   5 Minute Oxygen  Saturation % 91 % 92 % 90 %  86% at 5:40   5 Minute Liters of Oxygen  8 L 15 L 15 L  increased to 25 NRB   6 Minute Oxygen  Saturation % 95 % 93 % 95 %   6 Minute Liters of Oxygen  8 L 15 L 25 L  NRB   2 Minute Post Oxygen  Saturation % 97 % 99 % 91 %   2 Minute Post Liters of Oxygen  8 L 8 L 10 L  NRB      Psychological, QOL, Others - Outcomes: PHQ 2/9:    11/20/2023    9:17 AM 10/05/2023    3:04 PM 08/29/2023   11:57 AM 08/02/2023    1:23 PM 07/06/2023   11:07 AM  Depression screen PHQ 2/9  Decreased Interest 1 2 2  0 0  Down, Depressed, Hopeless 0 0 0 0 0  PHQ -  2 Score 1 2 2  0 0  Altered sleeping 0 0 0 0   Tired, decreased energy 1 2 2  0   Change in appetite 0 0 0 0   Feeling bad or failure about yourself  0 0 0 0   Trouble concentrating 0 0 0 0   Moving slowly or fidgety/restless 0 0 0 0   Suicidal thoughts 0 0 0 0   PHQ-9 Score 2  4  4   0    Difficult doing work/chores Not difficult at all Not difficult at all Not difficult at all Not difficult at all      Data saved with a previous flowsheet row definition    Quality of Life:  Quality of Life - 10/05/23 1536       Quality of Life Scores   Health/Function Pre 22.23 %    Socioeconomic Pre 23.58 %    Psych/Spiritual Pre 27.14 %    Family Pre 27 %    GLOBAL Pre 24.24 %          Personal Goals: Goals established at orientation with interventions provided to work toward goal.  Personal Goals and Risk Factors at Admission - 11/20/23  0919       Core Components/Risk Factors/Patient Goals on Admission   Improve shortness of breath with ADL's Yes    Intervention Provide education, individualized exercise plan and daily activity instruction to help decrease symptoms of SOB with activities of daily living.    Expected Outcomes Short Term: Improve cardiorespiratory fitness to achieve a reduction of symptoms when performing ADLs;Long Term: Be able to perform more ADLs without symptoms or delay the onset of symptoms           Personal Goals Discharge:  Goals and Risk Factor Review     Row Name 11/27/23 1310 12/27/23 1035 01/26/24 0918         Core Components/Risk Factors/Patient Goals Review   Personal Goals Review Improve shortness of breath with ADL's;Develop more efficient breathing techniques such as purse lipped breathing and diaphragmatic breathing and practicing self-pacing with activity. Improve shortness of breath with ADL's;Develop more efficient breathing techniques such as purse lipped breathing and diaphragmatic breathing and practicing self-pacing with activity. Improve shortness of breath with ADL's     Review Monthly review of patients Core Components/Risk Factors/Patient Goals are as follows: William [name] has not started PR yet. Unable to asses his goals at this time. Monthly review of patients Core Components/Risk Factors/Patient Goals are as follows: Goal progressing for improving shortness of breath. William [name] is currently using NRB mask while exercising to keep sats >88%. He is currently exercising on the Nustep for 30 minutes. Goal progressing for developing more efficient breathing techniques such as purse lipped breathing and diaphragmatic breathing; and practicing self-pacing with activity. We will continue to monitor his progress throughout the program. William [name] withdrew from the [facility] Re[facility] program due to deconditioning and increased oxygen  needs. William [name] last attended class on 12/16. Discharge review of patients Core  Components/Risk Factors/Patient Goals are as follows: Goal not met for improving shortness of breath with ADLs. William [name] had increased oxygen  needs and needed to wear a NRB mask+ 6L-8L HFNC while exercising to keep sats >88%. He exercised on the Nustep for 30 minutes while he attended class.     Expected Outcomes To improve shortness of breath with ADL's and develop more efficient breathing techniques such as purse lipped breathing and diaphragmatic breathing; and practicing self-pacing with activity. To improve shortness of breath  with ADL's and develop more efficient breathing techniques such as purse lipped breathing and diaphragmatic breathing; and practicing self-pacing with activity. To improve shortness of breath with ADL's post PR        Exercise Goals and Review:  Exercise Goals     Row Name 08/29/23 1625 10/05/23 1534 11/20/23 0922         Exercise Goals   Increase Physical Activity Yes Yes Yes     Intervention Provide advice, education, support and counseling about physical activity/exercise needs.;Develop an individualized exercise prescription for aerobic and resistive training based on initial evaluation findings, risk stratification, comorbidities and participant's personal goals. Provide advice, education, support and counseling about physical activity/exercise needs.;Develop an individualized exercise prescription for aerobic and resistive training based on initial evaluation findings, risk stratification, comorbidities and participant's personal goals. Provide advice, education, support and counseling about physical activity/exercise needs.;Develop an individualized exercise prescription for aerobic and resistive training based on initial evaluation findings, risk stratification, comorbidities and participant's personal goals.     Expected Outcomes Short Term: Attend rehab on a regular basis to increase amount of physical activity.;Long Term: Add in home exercise to make exercise part  of routine and to increase amount of physical activity.;Long Term: Exercising regularly at least 3-5 days a week. Short Term: Attend rehab on a regular basis to increase amount of physical activity.;Long Term: Add in home exercise to make exercise part of routine and to increase amount of physical activity.;Long Term: Exercising regularly at least 3-5 days a week. Short Term: Attend rehab on a regular basis to increase amount of physical activity.;Long Term: Add in home exercise to make exercise part of routine and to increase amount of physical activity.;Long Term: Exercising regularly at least 3-5 days a week.     Increase Strength and Stamina Yes -- Yes     Intervention Provide advice, education, support and counseling about physical activity/exercise needs.;Develop an individualized exercise prescription for aerobic and resistive training based on initial evaluation findings, risk stratification, comorbidities and participant's personal goals. Provide advice, education, support and counseling about physical activity/exercise needs.;Develop an individualized exercise prescription for aerobic and resistive training based on initial evaluation findings, risk stratification, comorbidities and participant's personal goals. Provide advice, education, support and counseling about physical activity/exercise needs.;Develop an individualized exercise prescription for aerobic and resistive training based on initial evaluation findings, risk stratification, comorbidities and participant's personal goals.     Expected Outcomes Short Term: Increase workloads from initial exercise prescription for resistance, speed, and METs.;Short Term: Perform resistance training exercises routinely during rehab and add in resistance training at home;Long Term: Improve cardiorespiratory fitness, muscular endurance and strength as measured by increased METs and functional capacity ( ) Short Term: Increase workloads from initial exercise  prescription for resistance, speed, and METs.;Short Term: Perform resistance training exercises routinely during rehab and add in resistance training at home;Long Term: Improve cardiorespiratory fitness, muscular endurance and strength as measured by increased METs and functional capacity ( ) Short Term: Increase workloads from initial exercise prescription for resistance, speed, and METs.;Short Term: Perform resistance training exercises routinely during rehab and add in resistance training at home;Long Term: Improve cardiorespiratory fitness, muscular endurance and strength as measured by increased METs and functional capacity ( )     Able to understand and use rate of perceived exertion (RPE) scale Yes Yes Yes     Intervention Provide education and explanation on how to use RPE scale Provide education and explanation on how to use RPE scale Provide education and explanation  on how to use RPE scale     Expected Outcomes Short Term: Able to use RPE daily in rehab to express subjective intensity level;Long Term:  Able to use RPE to guide intensity level when exercising independently Short Term: Able to use RPE daily in rehab to express subjective intensity level;Long Term:  Able to use RPE to guide intensity level when exercising independently Short Term: Able to use RPE daily in rehab to express subjective intensity level;Long Term:  Able to use RPE to guide intensity level when exercising independently     Able to understand and use Dyspnea scale Yes Yes Yes     Intervention Provide education and explanation on how to use Dyspnea scale Provide education and explanation on how to use Dyspnea scale Provide education and explanation on how to use Dyspnea scale     Expected Outcomes Short Term: Able to use Dyspnea scale daily in rehab to express subjective sense of shortness of breath during exertion;Long Term: Able to use Dyspnea scale to guide intensity level when exercising independently Short Term:  Able to use Dyspnea scale daily in rehab to express subjective sense of shortness of breath during exertion;Long Term: Able to use Dyspnea scale to guide intensity level when exercising independently Short Term: Able to use Dyspnea scale daily in rehab to express subjective sense of shortness of breath during exertion;Long Term: Able to use Dyspnea scale to guide intensity level when exercising independently     Knowledge and understanding of Target Heart Rate Range (THRR) Yes Yes Yes     Intervention Provide education and explanation of THRR including how the numbers were predicted and where they are located for reference Provide education and explanation of THRR including how the numbers were predicted and where they are located for reference Provide education and explanation of THRR including how the numbers were predicted and where they are located for reference     Expected Outcomes Short Term: Able to state/look up THRR;Long Term: Able to use THRR to govern intensity when exercising independently;Short Term: Able to use daily as guideline for intensity in rehab Short Term: Able to state/look up THRR;Long Term: Able to use THRR to govern intensity when exercising independently;Short Term: Able to use daily as guideline for intensity in rehab Short Term: Able to state/look up THRR;Long Term: Able to use THRR to govern intensity when exercising independently;Short Term: Able to use daily as guideline for intensity in rehab     Understanding of Exercise Prescription Yes Yes Yes     Intervention Provide education, explanation, and written materials on patient's individual exercise prescription Provide education, explanation, and written materials on patient's individual exercise prescription Provide education, explanation, and written materials on patient's individual exercise prescription     Expected Outcomes Short Term: Able to explain program exercise prescription;Long Term: Able to explain home exercise  prescription to exercise independently Short Term: Able to explain program exercise prescription;Long Term: Able to explain home exercise prescription to exercise independently Short Term: Able to explain program exercise prescription;Long Term: Able to explain home exercise prescription to exercise independently        Exercise Goals Re-Evaluation:  Exercise Goals Re-Evaluation     Row Name 10/16/23 1135 10/25/23 1111 10/30/23 1130 11/30/23 0841 12/29/23 1053     Exercise Goal Re-Evaluation   Exercise Goals Review Increase Physical Activity;Increase Strength and Stamina;Able to understand and use Dyspnea scale;Able to understand and use rate of perceived exertion (RPE) scale Increase Physical Activity;Increase Strength and Stamina;Able to  understand and use Dyspnea scale;Able to understand and use rate of perceived exertion (RPE) scale;Able to check pulse independently;Knowledge and understanding of Target Heart Rate Range (THRR);Understanding of Exercise Prescription Increase Physical Activity;Increase Strength and Stamina;Able to understand and use Dyspnea scale;Able to understand and use rate of perceived exertion (RPE) scale;Able to check pulse independently;Knowledge and understanding of Target Heart Rate Range (THRR);Understanding of Exercise Prescription Increase Physical Activity;Increase Strength and Stamina;Able to understand and use Dyspnea scale;Able to understand and use rate of perceived exertion (RPE) scale;Able to check pulse independently;Knowledge and understanding of Target Heart Rate Range (THRR);Understanding of Exercise Prescription Increase Physical Activity;Increase Strength and Stamina;Able to understand and use Dyspnea scale;Able to understand and use rate of perceived exertion (RPE) scale;Able to check pulse independently;Knowledge and understanding of Target Heart Rate Range (THRR);Understanding of Exercise Prescription   Comments William [name] was able to understand and use the RPE  scale appropriately. He has a pulse oximeter to monitor his oxygen  saturation and pulse. Reviewed exercise prescription with William [name]. William [name] completed the cardiac rehab program and will continue walking at home. He plans to enroll in pulmonary rehab once his wife recovers from upcoming surgery. William [name] has completed 1 exercise session. He exercises for 30 min on the Nustep. He averages 1.5 METs at level 1 on the Nustep. He performs the warmup and cooldown standing dependent on his shortness of breath/ fatigue. It is too soon to notate any discernable progressions. Will continue to monitor and progress as able. William [name] has completed 7 exercise session. He exercises for 30 min on the Nustep. He averages 1.7 METs at level 2 on the Nustep. He performs the warmup and cooldown standing dependent on his shortness of breath/ fatigue. William [name] is very decondioned. He has also had a recent hospitalization. It has been difficult to progress William [name] due to this. Will continue to monitor and progress as able.   Expected Outcomes William [name] will exercise on telemetry monitor for 5 session, then start the pulmonary rehab program. William [name] will walk 10-30 minutes, 2-3 days/week as tolerated. William [name] will conitnuing walking 10-30 minutes, 2-3 days/week as tolerated. He will continue exercise in pulmonary rehab. Through exercise at rehab and home, the patient will decrease shortness of breath with daily activities and feel confident in carrying out an exercise regimen at home Through exercise at rehab and home, the patient will decrease shortness of breath with daily activities and feel confident in carrying out an exercise regimen at home    Row Name 01/19/24 0938 02/13/24 1527           Exercise Goal Re-Evaluation   Exercise Goals Review Increase Physical Activity;Increase Strength and Stamina;Able to understand and use Dyspnea scale;Able to understand and use rate of perceived exertion (RPE) scale;Able to check pulse independently;Knowledge  and understanding of Target Heart Rate Range (THRR);Understanding of Exercise Prescription Increase Physical Activity;Increase Strength and Stamina;Able to understand and use Dyspnea scale;Able to understand and use rate of perceived exertion (RPE) scale;Able to check pulse independently;Knowledge and understanding of Target Heart Rate Range (THRR);Understanding of Exercise Prescription      Comments William [name] has completed 12 exercise sessions. He exercises for 30 min on the Nustep. He averages 1.7 METs at level 2 on the Nustep. He performs the warmup and cooldown standing dependent on his shortness of breath/ fatigue. William [name] is still difficult to progress due to his need for high flow nasal cannula and nonrebreather mask. Will continue to monitor and progress as able. William [name] has completed 13 exercise sessions. His peak  METs were 1.7 on the Nutep. Pt discharged due to deconditioned state.      Expected Outcomes Through exercise at rehab and home, the patient will decrease shortness of breath with daily activities and feel confident in carrying out an exercise regimen at home Through exercise at rehab and home, the patient will decrease shortness of breath with daily activities and feel confident in carrying out an exercise regimen at home         Nutrition & Weight - Outcomes:  Pre Biometrics - 11/20/23 0910       Pre Biometrics   Grip Strength 29 kg           Nutrition:   Nutrition Discharge:   Education Questionnaire Score:  Knowledge Questionnaire Score - 11/20/23 0928       Knowledge Questionnaire Score   Pre Score 15//18         William [name] withdrew from the program due to increased deconditioning and higher oxygen  needs. Last psy/soc re-eval in December, William [name] continued to deny any new psy/soc barriers or concerns at this time. He has good support from his friends and family. He denied any needs.   Discharge review of patients Core Components/Risk Factors/Patient Goals are as follows:  Goal not met for improving shortness of breath with ADLs. William [name] had increased oxygen  needs and needed to wear a NRB mask+ 6L-8L HFNC while exercising to keep sats >88%. He exercised on the Nustep for 30 minutes while he attended class.       [1]  Social History Tobacco Use  Smoking Status Former   Current packs/day: 0.00   Average packs/day: 0.5 packs/day for 17.0 years (8.5 ttl pk-yrs)   Types: Cigarettes   Start date: 46   Quit date: 59   Years since quitting: 43.0  Smokeless Tobacco Never  Tobacco Comments   QUIT I 1983

## 2024-02-13 NOTE — Addendum Note (Signed)
 Encounter addended by: Nicholaus Johnnie PARAS on: 02/13/2024 3:29 PM  Actions taken: Flowsheet data copied forward, Flowsheet accepted

## 2024-02-13 NOTE — Addendum Note (Signed)
 Encounter addended by: Mccauley Diehl, RN on: 02/13/2024 3:49 PM  Actions taken: Flowsheet data copied forward, Flowsheet accepted, Clinical Note Signed

## 2024-02-13 NOTE — Telephone Encounter (Signed)
 Okay, all orders completed

## 2024-02-13 NOTE — Telephone Encounter (Signed)
 Last OV with PCP was 12/15/23. Last refill for Lasix  completed 12/08/23 (60,0) 30 d/s  Referrals pending for providers requested, no dx code added. Please add and sign, if appropriate.

## 2024-02-13 NOTE — Telephone Encounter (Addendum)
 Received message from William [name]'s spouse, William [name]. William [name] stated William [name] is unable to complete the rest of his rehab. Will cancel all appts and discharge pt.

## 2024-02-15 ENCOUNTER — Encounter (HOSPITAL_COMMUNITY)

## 2024-02-20 ENCOUNTER — Encounter (HOSPITAL_COMMUNITY)

## 2024-02-21 ENCOUNTER — Inpatient Hospital Stay (HOSPITAL_COMMUNITY)

## 2024-02-21 ENCOUNTER — Inpatient Hospital Stay (HOSPITAL_COMMUNITY)
Admission: EM | Admit: 2024-02-21 | Discharge: 2024-03-10 | DRG: 196 | Disposition: E | Attending: Internal Medicine | Admitting: Internal Medicine

## 2024-02-21 ENCOUNTER — Encounter (HOSPITAL_COMMUNITY): Payer: Self-pay

## 2024-02-21 ENCOUNTER — Other Ambulatory Visit: Payer: Self-pay

## 2024-02-21 ENCOUNTER — Emergency Department (HOSPITAL_COMMUNITY)

## 2024-02-21 DIAGNOSIS — Z952 Presence of prosthetic heart valve: Secondary | ICD-10-CM

## 2024-02-21 DIAGNOSIS — Z79621 Long term (current) use of calcineurin inhibitor: Secondary | ICD-10-CM

## 2024-02-21 DIAGNOSIS — Z833 Family history of diabetes mellitus: Secondary | ICD-10-CM | POA: Diagnosis not present

## 2024-02-21 DIAGNOSIS — I468 Cardiac arrest due to other underlying condition: Secondary | ICD-10-CM | POA: Diagnosis present

## 2024-02-21 DIAGNOSIS — Z888 Allergy status to other drugs, medicaments and biological substances status: Secondary | ICD-10-CM

## 2024-02-21 DIAGNOSIS — J8410 Pulmonary fibrosis, unspecified: Principal | ICD-10-CM | POA: Diagnosis present

## 2024-02-21 DIAGNOSIS — Z8616 Personal history of COVID-19: Secondary | ICD-10-CM

## 2024-02-21 DIAGNOSIS — E782 Mixed hyperlipidemia: Secondary | ICD-10-CM | POA: Diagnosis present

## 2024-02-21 DIAGNOSIS — E1122 Type 2 diabetes mellitus with diabetic chronic kidney disease: Secondary | ICD-10-CM | POA: Diagnosis present

## 2024-02-21 DIAGNOSIS — Z944 Liver transplant status: Secondary | ICD-10-CM | POA: Diagnosis not present

## 2024-02-21 DIAGNOSIS — Z79899 Other long term (current) drug therapy: Secondary | ICD-10-CM

## 2024-02-21 DIAGNOSIS — K7581 Nonalcoholic steatohepatitis (NASH): Secondary | ICD-10-CM | POA: Diagnosis present

## 2024-02-21 DIAGNOSIS — I272 Pulmonary hypertension, unspecified: Secondary | ICD-10-CM

## 2024-02-21 DIAGNOSIS — I2723 Pulmonary hypertension due to lung diseases and hypoxia: Secondary | ICD-10-CM | POA: Diagnosis present

## 2024-02-21 DIAGNOSIS — Z8679 Personal history of other diseases of the circulatory system: Secondary | ICD-10-CM

## 2024-02-21 DIAGNOSIS — Z8249 Family history of ischemic heart disease and other diseases of the circulatory system: Secondary | ICD-10-CM | POA: Diagnosis not present

## 2024-02-21 DIAGNOSIS — Z7982 Long term (current) use of aspirin: Secondary | ICD-10-CM

## 2024-02-21 DIAGNOSIS — J9601 Acute respiratory failure with hypoxia: Principal | ICD-10-CM

## 2024-02-21 DIAGNOSIS — Z85828 Personal history of other malignant neoplasm of skin: Secondary | ICD-10-CM | POA: Diagnosis not present

## 2024-02-21 DIAGNOSIS — J849 Interstitial pulmonary disease, unspecified: Secondary | ICD-10-CM | POA: Diagnosis not present

## 2024-02-21 DIAGNOSIS — D72829 Elevated white blood cell count, unspecified: Secondary | ICD-10-CM | POA: Diagnosis present

## 2024-02-21 DIAGNOSIS — Z825 Family history of asthma and other chronic lower respiratory diseases: Secondary | ICD-10-CM

## 2024-02-21 DIAGNOSIS — R0602 Shortness of breath: Secondary | ICD-10-CM | POA: Diagnosis present

## 2024-02-21 DIAGNOSIS — Z1152 Encounter for screening for COVID-19: Secondary | ICD-10-CM

## 2024-02-21 DIAGNOSIS — I509 Heart failure, unspecified: Secondary | ICD-10-CM

## 2024-02-21 DIAGNOSIS — I2722 Pulmonary hypertension due to left heart disease: Secondary | ICD-10-CM | POA: Diagnosis present

## 2024-02-21 DIAGNOSIS — Z9861 Coronary angioplasty status: Secondary | ICD-10-CM

## 2024-02-21 DIAGNOSIS — Z7952 Long term (current) use of systemic steroids: Secondary | ICD-10-CM

## 2024-02-21 DIAGNOSIS — Z806 Family history of leukemia: Secondary | ICD-10-CM

## 2024-02-21 DIAGNOSIS — Z7984 Long term (current) use of oral hypoglycemic drugs: Secondary | ICD-10-CM | POA: Diagnosis not present

## 2024-02-21 DIAGNOSIS — J9621 Acute and chronic respiratory failure with hypoxia: Secondary | ICD-10-CM | POA: Diagnosis present

## 2024-02-21 DIAGNOSIS — Z801 Family history of malignant neoplasm of trachea, bronchus and lung: Secondary | ICD-10-CM

## 2024-02-21 DIAGNOSIS — N1832 Chronic kidney disease, stage 3b: Secondary | ICD-10-CM | POA: Diagnosis present

## 2024-02-21 DIAGNOSIS — Z800 Family history of malignant neoplasm of digestive organs: Secondary | ICD-10-CM

## 2024-02-21 DIAGNOSIS — Z87891 Personal history of nicotine dependence: Secondary | ICD-10-CM | POA: Diagnosis not present

## 2024-02-21 DIAGNOSIS — I2510 Atherosclerotic heart disease of native coronary artery without angina pectoris: Secondary | ICD-10-CM | POA: Diagnosis present

## 2024-02-21 DIAGNOSIS — I130 Hypertensive heart and chronic kidney disease with heart failure and stage 1 through stage 4 chronic kidney disease, or unspecified chronic kidney disease: Secondary | ICD-10-CM | POA: Diagnosis present

## 2024-02-21 DIAGNOSIS — I5033 Acute on chronic diastolic (congestive) heart failure: Secondary | ICD-10-CM | POA: Diagnosis present

## 2024-02-21 DIAGNOSIS — Z881 Allergy status to other antibiotic agents status: Secondary | ICD-10-CM

## 2024-02-21 DIAGNOSIS — I10 Essential (primary) hypertension: Secondary | ICD-10-CM | POA: Diagnosis present

## 2024-02-21 DIAGNOSIS — Z9981 Dependence on supplemental oxygen: Secondary | ICD-10-CM

## 2024-02-21 DIAGNOSIS — Z951 Presence of aortocoronary bypass graft: Secondary | ICD-10-CM

## 2024-02-21 LAB — CBC WITH DIFFERENTIAL/PLATELET
Basophils Absolute: 0 K/uL (ref 0.0–0.1)
Basophils Relative: 0 %
Eosinophils Absolute: 0.9 K/uL — ABNORMAL HIGH (ref 0.0–0.5)
Eosinophils Relative: 3 %
HCT: 50.1 % (ref 39.0–52.0)
Hemoglobin: 16.8 g/dL (ref 13.0–17.0)
Lymphocytes Relative: 6 %
Lymphs Abs: 1.8 K/uL (ref 0.7–4.0)
MCH: 30.6 pg (ref 26.0–34.0)
MCHC: 33.5 g/dL (ref 30.0–36.0)
MCV: 91.3 fL (ref 80.0–100.0)
Monocytes Absolute: 2.4 K/uL — ABNORMAL HIGH (ref 0.1–1.0)
Monocytes Relative: 8 %
Neutro Abs: 24.4 K/uL — ABNORMAL HIGH (ref 1.7–7.7)
Neutrophils Relative %: 83 %
Platelets: 223 K/uL (ref 150–400)
RBC: 5.49 MIL/uL (ref 4.22–5.81)
RDW: 16.8 % — ABNORMAL HIGH (ref 11.5–15.5)
WBC: 29.4 K/uL — ABNORMAL HIGH (ref 4.0–10.5)
nRBC: 0 % (ref 0.0–0.2)

## 2024-02-21 LAB — I-STAT VENOUS BLOOD GAS, ED
Acid-Base Excess: 0 mmol/L (ref 0.0–2.0)
Bicarbonate: 25.2 mmol/L (ref 20.0–28.0)
Calcium, Ion: 1.05 mmol/L — ABNORMAL LOW (ref 1.15–1.40)
HCT: 49 % (ref 39.0–52.0)
Hemoglobin: 16.7 g/dL (ref 13.0–17.0)
O2 Saturation: 93 %
Potassium: 4.6 mmol/L (ref 3.5–5.1)
Sodium: 135 mmol/L (ref 135–145)
TCO2: 26 mmol/L (ref 22–32)
pCO2, Ven: 41.8 mmHg — ABNORMAL LOW (ref 44–60)
pH, Ven: 7.388 (ref 7.25–7.43)
pO2, Ven: 69 mmHg — ABNORMAL HIGH (ref 32–45)

## 2024-02-21 LAB — TROPONIN T, HIGH SENSITIVITY
Troponin T High Sensitivity: 41 ng/L — ABNORMAL HIGH (ref 0–19)
Troponin T High Sensitivity: 44 ng/L — ABNORMAL HIGH (ref 0–19)

## 2024-02-21 LAB — I-STAT CG4 LACTIC ACID, ED
Lactic Acid, Venous: 6.6 mmol/L (ref 0.5–1.9)
Lactic Acid, Venous: 2.9 mmol/L (ref 0.5–1.9)

## 2024-02-21 LAB — COMPREHENSIVE METABOLIC PANEL WITH GFR
ALT: 24 U/L (ref 0–44)
AST: 31 U/L (ref 15–41)
Albumin: 4.5 g/dL (ref 3.5–5.0)
Alkaline Phosphatase: 69 U/L (ref 38–126)
Anion gap: 20 — ABNORMAL HIGH (ref 5–15)
BUN: 28 mg/dL — ABNORMAL HIGH (ref 8–23)
CO2: 22 mmol/L (ref 22–32)
Calcium: 9.7 mg/dL (ref 8.9–10.3)
Chloride: 95 mmol/L — ABNORMAL LOW (ref 98–111)
Creatinine, Ser: 1.46 mg/dL — ABNORMAL HIGH (ref 0.61–1.24)
GFR, Estimated: 50 mL/min — ABNORMAL LOW
Glucose, Bld: 183 mg/dL — ABNORMAL HIGH (ref 70–99)
Potassium: 4.7 mmol/L (ref 3.5–5.1)
Sodium: 137 mmol/L (ref 135–145)
Total Bilirubin: 2 mg/dL — ABNORMAL HIGH (ref 0.0–1.2)
Total Protein: 6.9 g/dL (ref 6.5–8.1)

## 2024-02-21 LAB — RESP PANEL BY RT-PCR (RSV, FLU A&B, COVID)  RVPGX2
Influenza A by PCR: NEGATIVE
Influenza B by PCR: NEGATIVE
Resp Syncytial Virus by PCR: NEGATIVE
SARS Coronavirus 2 by RT PCR: NEGATIVE

## 2024-02-21 LAB — PRO BRAIN NATRIURETIC PEPTIDE: Pro Brain Natriuretic Peptide: 5205 pg/mL — ABNORMAL HIGH

## 2024-02-21 LAB — CBG MONITORING, ED: Glucose-Capillary: 400 mg/dL — ABNORMAL HIGH (ref 70–99)

## 2024-02-21 LAB — C-REACTIVE PROTEIN: CRP: 2.4 mg/dL — ABNORMAL HIGH

## 2024-02-21 LAB — PROTIME-INR
INR: 1 (ref 0.8–1.2)
Prothrombin Time: 13.6 s (ref 11.4–15.2)

## 2024-02-21 LAB — SEDIMENTATION RATE: Sed Rate: 4 mm/h (ref 0–16)

## 2024-02-21 MED ORDER — AZITHROMYCIN 250 MG PO TABS
500.0000 mg | ORAL_TABLET | Freq: Every day | ORAL | Status: DC
  Administered 2024-02-21: 500 mg via ORAL
  Filled 2024-02-21: qty 2

## 2024-02-21 MED ORDER — EPINEPHRINE 1 MG/10ML IV SOSY
PREFILLED_SYRINGE | INTRAVENOUS | Status: AC
  Filled 2024-02-21: qty 30

## 2024-02-21 MED ORDER — NOREPINEPHRINE BITARTRATE 1 MG/ML IV SOLN
INTRAVENOUS | Status: AC | PRN
  Administered 2024-02-21: 15 ug via INTRAVENOUS

## 2024-02-21 MED ORDER — ASPIRIN 81 MG PO TBEC: 81.0000 mg | DELAYED_RELEASE_TABLET | Freq: Every day | ORAL | Status: DC

## 2024-02-21 MED ORDER — CALCIUM CHLORIDE 10 % IV SOLN
INTRAVENOUS | Status: AC | PRN
  Administered 2024-02-21: 1 g via INTRAVENOUS

## 2024-02-21 MED ORDER — FUROSEMIDE 10 MG/ML IJ SOLN
20.0000 mg | Freq: Once | INTRAMUSCULAR | Status: AC
  Administered 2024-02-21: 20 mg via INTRAVENOUS
  Filled 2024-02-21: qty 2

## 2024-02-21 MED ORDER — TACROLIMUS 1 MG PO CAPS: 1.0000 mg | ORAL_CAPSULE | Freq: Every evening | ORAL | Status: DC

## 2024-02-21 MED ORDER — ALUM & MAG HYDROXIDE-SIMETH 200-200-20 MG/5ML PO SUSP: 30.0000 mL | Freq: Four times a day (QID) | ORAL | Status: DC | PRN

## 2024-02-21 MED ORDER — EPINEPHRINE 1 MG/10ML IV SOSY
PREFILLED_SYRINGE | INTRAVENOUS | Status: AC | PRN
  Administered 2024-02-21 (×2): 1 mg via INTRAVENOUS

## 2024-02-21 MED ORDER — EPINEPHRINE 1 MG/10ML IV SOSY
PREFILLED_SYRINGE | INTRAVENOUS | Status: AC
  Filled 2024-02-21: qty 20

## 2024-02-21 MED ORDER — EPINEPHRINE 1 MG/10ML IV SOSY
PREFILLED_SYRINGE | INTRAVENOUS | Status: AC | PRN
  Administered 2024-02-21 (×4): 1 mg via INTRAVENOUS

## 2024-02-21 MED ORDER — MYCOPHENOLATE MOFETIL 250 MG PO CAPS: 500.0000 mg | ORAL_CAPSULE | Freq: Every morning | ORAL | Status: DC

## 2024-02-21 MED ORDER — SODIUM CHLORIDE 0.9 % IV SOLN: 250.0000 mg | Freq: Every day | INTRAVENOUS | Status: DC

## 2024-02-21 MED ORDER — SODIUM CHLORIDE 0.9 % IV SOLN
1.0000 g | Freq: Once | INTRAVENOUS | Status: AC
  Administered 2024-02-21: 1 g via INTRAVENOUS
  Filled 2024-02-21: qty 10

## 2024-02-21 MED ORDER — SUCCINYLCHOLINE CHLORIDE 20 MG/ML IJ SOLN
INTRAMUSCULAR | Status: AC | PRN
  Administered 2024-02-21: 150 mg via INTRAVENOUS

## 2024-02-21 MED ORDER — SODIUM BICARBONATE 8.4 % IV SOLN
INTRAVENOUS | Status: AC | PRN
  Administered 2024-02-21: 50 meq via INTRAVENOUS

## 2024-02-21 MED ORDER — TACROLIMUS 1 MG PO CAPS: 2.0000 mg | ORAL_CAPSULE | Freq: Every morning | ORAL | Status: DC

## 2024-02-21 MED ORDER — METHYLPREDNISOLONE SODIUM SUCC 125 MG IJ SOLR
125.0000 mg | Freq: Once | INTRAMUSCULAR | Status: AC
  Administered 2024-02-21: 125 mg via INTRAVENOUS
  Filled 2024-02-21: qty 2

## 2024-02-21 MED ORDER — FUROSEMIDE 10 MG/ML IJ SOLN
40.0000 mg | Freq: Two times a day (BID) | INTRAMUSCULAR | Status: DC
  Administered 2024-02-21: 40 mg via INTRAVENOUS
  Filled 2024-02-21: qty 4

## 2024-02-21 MED ORDER — SODIUM CHLORIDE 0.9 % IV SOLN: 1.0000 g | INTRAVENOUS | Status: DC

## 2024-02-21 MED ORDER — ALBUTEROL SULFATE (2.5 MG/3ML) 0.083% IN NEBU: 2.5000 mg | INHALATION_SOLUTION | RESPIRATORY_TRACT | Status: DC | PRN

## 2024-02-21 MED ORDER — ACETAMINOPHEN 325 MG PO TABS
650.0000 mg | ORAL_TABLET | Freq: Four times a day (QID) | ORAL | Status: DC | PRN
  Administered 2024-02-21: 650 mg via ORAL
  Filled 2024-02-21: qty 2

## 2024-02-21 MED ORDER — ROSUVASTATIN CALCIUM 5 MG PO TABS
10.0000 mg | ORAL_TABLET | Freq: Every day | ORAL | Status: DC
  Administered 2024-02-21: 10 mg via ORAL
  Filled 2024-02-21: qty 2

## 2024-02-21 MED ORDER — ONDANSETRON HCL 4 MG/2ML IJ SOLN: 4.0000 mg | Freq: Four times a day (QID) | INTRAMUSCULAR | Status: DC | PRN

## 2024-02-21 MED ORDER — ONDANSETRON HCL 4 MG PO TABS: 4.0000 mg | ORAL_TABLET | Freq: Four times a day (QID) | ORAL | Status: DC | PRN

## 2024-02-21 MED ORDER — OXYMETAZOLINE HCL 0.05 % NA SOLN: 1.0000 | Freq: Two times a day (BID) | NASAL | Status: DC | PRN

## 2024-02-21 MED ORDER — METOPROLOL TARTRATE 25 MG PO TABS: 50.0000 mg | ORAL_TABLET | Freq: Two times a day (BID) | ORAL | Status: DC

## 2024-02-21 MED ORDER — MYCOPHENOLATE MOFETIL 250 MG PO CAPS: 250.0000 mg | ORAL_CAPSULE | Freq: Every evening | ORAL | Status: DC

## 2024-02-21 MED ORDER — ENOXAPARIN SODIUM 40 MG/0.4ML IJ SOSY
40.0000 mg | PREFILLED_SYRINGE | INTRAMUSCULAR | Status: DC
  Administered 2024-02-21: 40 mg via SUBCUTANEOUS
  Filled 2024-02-21: qty 0.4

## 2024-02-21 MED ORDER — METHYLPREDNISOLONE SODIUM SUCC 40 MG IJ SOLR: 40.0000 mg | Freq: Two times a day (BID) | INTRAMUSCULAR | Status: DC

## 2024-02-21 MED ORDER — ACETAMINOPHEN 650 MG RE SUPP: 650.0000 mg | Freq: Four times a day (QID) | RECTAL | Status: DC | PRN

## 2024-02-21 MED ORDER — INSULIN ASPART 100 UNIT/ML IJ SOLN
0.0000 [IU] | Freq: Three times a day (TID) | INTRAMUSCULAR | Status: DC
  Administered 2024-02-21: 15 [IU] via SUBCUTANEOUS
  Filled 2024-02-21: qty 15

## 2024-02-21 MED ORDER — PANTOPRAZOLE SODIUM 40 MG PO TBEC: 40.0000 mg | DELAYED_RELEASE_TABLET | Freq: Every day | ORAL | Status: DC

## 2024-02-21 MED ORDER — MAGNESIUM SULFATE 2 GM/50ML IV SOLN
2.0000 g | Freq: Once | INTRAVENOUS | Status: AC
  Administered 2024-02-21: 2 g via INTRAVENOUS
  Filled 2024-02-21: qty 50

## 2024-02-21 MED ORDER — AMIODARONE IV BOLUS ONLY 150 MG/100ML
INTRAVENOUS | Status: AC | PRN
  Administered 2024-02-21 (×2): 150 mg via INTRAVENOUS

## 2024-02-21 NOTE — ED Notes (Signed)
 Rt was called after pt's family informed this narrator that the pt had to have a bowel movement. When this narrator walked in pt was dyspneic, O2 sats in the 70s and diaphoretic. Pt was on high flow nasal cannula but placed on NRB dut to respiratory distress.  RT was called for assistance. The family also stated that he has been uncomfortable and breaths better with a recliner chair, After RT arrived and assessed and adjust pt O2, this narrator attempted to find a recliner while RT stayed in  the room with the pt. Charge nurse also helped me find recliner and was informed of the pt decline

## 2024-02-21 NOTE — ED Notes (Signed)
 Dr. Gifford was made aware of cardiac arrest with unsuccessful ROSC.

## 2024-02-21 NOTE — ED Provider Notes (Signed)
 " [place] EMERGENCY DEPARTMENT AT [facility] Provider Note   CSN: [id number] Arrival date & time: 03/09/2024  9074     Patient presents with: Respiratory Distress   William [name] is a 74 y.o. male.  Patient arriving by private vehicle in respiratory distress.  History of pulmonary fibrosis, liver transplant.  Mostly follows with [facility] but has doctors at this site also.  Worsening shortness of breath over the past couple of weeks.  Nonproductive cough.  No fever.  Extremely air hungry with any type of exertion.  Recently increased steroids from 10 to 20 mg.  Remote tobacco use.   The history is provided by the patient and the spouse.  Shortness of Breath Severity:  Severe Onset quality:  Gradual Duration:  2 weeks Timing:  Constant Progression:  Worsening Chronicity:  Recurrent Relieved by:  Nothing Worsened by:  Activity Ineffective treatments:  Oxygen  Associated symptoms: chest pain and cough   Associated symptoms: no abdominal pain, no fever, no hemoptysis, no sputum production, no vomiting and no wheezing   Risk factors: no tobacco use        Prior to Admission medications  Medication Sig Start Date End Date Taking? Authorizing Provider  acetaminophen  (TYLENOL ) 500 MG tablet Take 1,000 mg by mouth as needed.    [provider]  amLODipine  (NORVASC ) 10 MG tablet Take 1 tablet (10 mg total) by mouth daily. 01/26/23   McGowen, Aleene DEL, MD  ammonium lactate  (AMLACTIN) 12 % cream Apply 2 times daily to forearms and hands in preparation for treatment with Efudex  (fluorouracil ) Patient taking differently: Apply 1 Application topically as needed for dry skin. 02/10/22     amoxicillin  (AMOXIL ) 500 MG capsule Prophylaxis for dental procedure. Take 2 grams (4x 500mg  tablets) 1 hour before procedure. 11/07/23   Wonda Sharper, MD  aspirin  EC 81 MG tablet Take 81 mg by mouth daily. Swallow whole.    [provider]  furosemide  (LASIX ) 20 MG tablet Take 2  tablets (40 mg total) by mouth once daily 02/13/24   McGowen, Aleene DEL, MD  metFORMIN  (GLUCOPHAGE ) 500 MG tablet Take 1 tablet (500 mg total) by mouth 2 (two) times daily with a meal. 07/24/23   McGowen, Aleene DEL, MD  metoprolol  tartrate (LOPRESSOR ) 100 MG tablet Take 1 tablet (100 mg total) by mouth 2 (two) times daily. 01/01/24   McGowen, Aleene DEL, MD  mycophenolate  (CELLCEPT ) 250 MG capsule Take 2 capsules by mouth in the morning and 1 capsule in the evening. 03/03/23     pantoprazole  (PROTONIX ) 40 MG tablet Take 1 tablet (40 mg total) by mouth daily. 01/16/24     predniSONE  (DELTASONE ) 10 MG tablet Take 2 tablets (20 mg total) by mouth daily. 01/24/24     rosuvastatin  (CRESTOR ) 10 MG tablet Take 1 tablet (10 mg total) by mouth daily. 08/28/23 08/22/24  Lelon Glendia DASEN, PA-C  Specialty Vitamins Products (MG PLUS PROTEIN) 133 MG TABS Take 1 tablet (133 mg total) by mouth 2 (two) times daily at lunch and dinner 01/29/24     tacrolimus  (PROGRAF ) 1 MG capsule Take 2 capsules (2 mg total) by mouth every morning AND 1 capsule (1 mg total) every evening. 08/31/23     triamcinolone  cream (KENALOG ) 0.1 % Apply 2 times a day to rash. Follow with CeraVe Cream.  Stop when smooth. 11/28/22       Allergies: Niacinamide, Thiazide-type diuretics, and Quinolones    Review of Systems  Constitutional:  Negative for fever.  Respiratory:  Positive for cough and shortness of breath. Negative for hemoptysis, sputum production and wheezing.   Cardiovascular:  Positive for chest pain.  Gastrointestinal:  Negative for abdominal pain and vomiting.    Updated Vital Signs BP (!) 190/118   Pulse 90   Temp (!) 96 F (35.6 C) (Axillary)   Resp (!) 27   SpO2 90%   Physical Exam Vitals and nursing note reviewed.  Constitutional:      General: He is in acute distress.     Appearance: Normal appearance. He is well-developed.  HENT:     Head: Normocephalic and atraumatic.  Eyes:     Conjunctiva/sclera: Conjunctivae  normal.  Cardiovascular:     Rate and Rhythm: Normal rate and regular rhythm.     Heart sounds: No murmur heard. Pulmonary:     Effort: Tachypnea, accessory muscle usage and respiratory distress present.     Breath sounds: Normal breath sounds.  Abdominal:     Palpations: Abdomen is soft.     Tenderness: There is no abdominal tenderness. There is no guarding or rebound.  Musculoskeletal:     Cervical back: Neck supple.     Right lower leg: No edema.     Left lower leg: No edema.  Skin:    General: Skin is warm and dry.  Neurological:     General: No focal deficit present.     Mental Status: He is alert.     GCS: GCS eye subscore is 4. GCS verbal subscore is 5. GCS motor subscore is 6.     (all labs ordered are listed, but only abnormal results are displayed) Labs Reviewed  CBC WITH DIFFERENTIAL/PLATELET - Abnormal; Notable for the following components:      Result Value   WBC 29.4 (*)    RDW 16.8 (*)    Neutro Abs 24.4 (*)    Monocytes Absolute 2.4 (*)    Eosinophils Absolute 0.9 (*)    All other components within normal limits  PRO BRAIN NATRIURETIC PEPTIDE - Abnormal; Notable for the following components:   Pro Brain Natriuretic Peptide 5,205.0 (*)    All other components within normal limits  COMPREHENSIVE METABOLIC PANEL WITH GFR - Abnormal; Notable for the following components:   Chloride 95 (*)    Glucose, Bld 183 (*)    BUN 28 (*)    Creatinine, Ser 1.46 (*)    Total Bilirubin 2.0 (*)    GFR, Estimated 50 (*)    Anion gap 20 (*)    All other components within normal limits  C-REACTIVE PROTEIN - Abnormal; Notable for the following components:   CRP 2.4 (*)    All other components within normal limits  I-STAT VENOUS BLOOD GAS, ED - Abnormal; Notable for the following components:   pCO2, Ven 41.8 (*)    pO2, Ven 69 (*)    Calcium , Ion 1.05 (*)    All other components within normal limits  I-STAT CG4 LACTIC ACID , ED - Abnormal; Notable for the following  components:   Lactic Acid , Venous 6.6 (*)    All other components within normal limits  I-STAT CG4 LACTIC ACID , ED - Abnormal; Notable for the following components:   Lactic Acid , Venous 2.9 (*)    All other components within normal limits  CBG MONITORING, ED - Abnormal; Notable for the following components:   Glucose-Capillary 400 (*)    All other components within normal limits  TROPONIN T, HIGH SENSITIVITY - Abnormal; Notable for the  following components:   Troponin T High Sensitivity 41 (*)    All other components within normal limits  TROPONIN T, HIGH SENSITIVITY - Abnormal; Notable for the following components:   Troponin T High Sensitivity 44 (*)    All other components within normal limits  RESP PANEL BY RT-PCR (RSV, FLU A&B, COVID)  RVPGX2  CULTURE, BLOOD (ROUTINE X 2)  CULTURE, BLOOD (ROUTINE X 2)  PROTIME-INR  SEDIMENTATION RATE  COMPREHENSIVE METABOLIC PANEL WITH GFR  CBC  PROTIME-INR    EKG: EKG Interpretation Date/Time:  Wednesday February 21 2024 09:34:48 EST Ventricular Rate:  86 PR Interval:  199 QRS Duration:  153 QT Interval:  411 QTC Calculation: 492 R Axis:   -26  Text Interpretation: Sinus rhythm Multiple premature complexes, vent & supraven IVCD, consider atypical LBBB No significant change since prior 10/25 Confirmed by Towana Sharper [phone]) on 02/19/2024 9:42:26 AM  Radiology: CT CHEST WO CONTRAST Result Date: 02/28/2024 CLINICAL DATA:  Interstitial lung disease flare versus volume overload. Hypoxia. Worsening shortness of breath over the past week. EXAM: CT CHEST WITHOUT CONTRAST TECHNIQUE: Multidetector CT imaging of the chest was performed following the standard protocol without IV contrast. RADIATION DOSE REDUCTION: This exam was performed according to the departmental dose-optimization program which includes automated exposure control, adjustment of the mA and/or kV according to patient size and/or use of iterative reconstruction technique.  COMPARISON:  04/25/2018 FINDINGS: Cardiovascular: Median sternotomy wires are present. Heart is normal size. Evidence of previous TAVR. Calcified plaque over the left main and 3 vessel coronary arteries. Thoracic aorta is normal in caliber. Mild calcified plaque throughout the descending thoracic aorta. Pulmonary arteries and remaining vascular structures are unremarkable on this noncontrast exam. Mediastinum/Nodes: Stable 1 cm AP window lymph node. Cluster of small right paratracheal lymph nodes slightly more prominent. No other significant mediastinal or hilar adenopathy. Remaining mediastinal structures are unremarkable. Lungs/Pleura: Lungs are adequately inflated. Evidence of patchy bilateral pulmonary fibrosis worse over the lower lungs with associated mild traction bronchiectasis. No concerning superimposed airspace process. Less chronic glass opacification over the mid to upper lobes as seen previously. No concerning pulmonary nodules/masses. Central airways are normal. Upper Abdomen: Mild calcified plaque over the abdominal aorta with possible beginning of aortic stent graft on the most inferior image. No acute findings in the upper abdomen. Musculoskeletal: No acute findings. IMPRESSION: 1. No acute cardiopulmonary disease. 2. Evidence of stable patchy bilateral pulmonary fibrosis worse over the lower lungs. No concerning superimposed acute airspace process. 3. Aortic atherosclerosis. Atherosclerotic coronary artery disease. Aortic Atherosclerosis (ICD10-I70.0). Electronically Signed   By: Toribio Agreste M.D.   On: 02/23/2024 14:34   DG Chest Port 1 View Result Date: 03/06/2024 CLINICAL DATA:  Shortness of breath EXAM: PORTABLE CHEST 1 VIEW COMPARISON:  November 30, 2023 FINDINGS: Stable cardiomegaly. 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 Stable bibasilar reticular opacities are noted most consistent with pulmonary fibrosis or other chronic interstitial lung  disease. No definite acute pulmonary abnormality is noted. Bony thorax is unremarkable. IMPRESSION: Stable bibasilar reticular opacities most consistent with pulmonary fibrosis or other chronic interstitial lung disease. No definite acute pulmonary abnormality is noted. Electronically Signed   By: Lynwood Landy Raddle M.D.   On: 02/28/2024 10:21     .Critical Care  Performed by: Towana Sharper BROCKS, MD Authorized by: Towana Sharper BROCKS, MD   Critical care provider statement:    Critical care time (minutes):  45   Critical care time was  exclusive of:  Separately billable procedures and treating other patients   Critical care was necessary to treat or prevent imminent or life-threatening deterioration of the following conditions:  Respiratory failure   Critical care was time spent personally by me on the following activities:  Development of treatment plan with patient or surrogate, discussions with consultants, evaluation of patient's response to treatment, examination of patient, obtaining history from patient or surrogate, ordering and performing treatments and interventions, ordering and review of laboratory studies, ordering and review of radiographic studies, pulse oximetry, re-evaluation of patient's condition and review of old charts   I assumed direction of critical care for this patient from another provider in my specialty: no      Medications Ordered in the ED  azithromycin  (ZITHROMAX ) tablet 500 mg (500 mg Oral Given 02/28/2024 1053)  aspirin  EC tablet 81 mg (has no administration in time range)  metoprolol  tartrate (LOPRESSOR ) tablet 50 mg (has no administration in time range)  rosuvastatin  (CRESTOR ) tablet 10 mg (10 mg Oral Given 02/08/2024 1426)  pantoprazole  (PROTONIX ) EC tablet 40 mg (has no administration in time range)  mycophenolate  (CELLCEPT ) capsule 500 mg (has no administration in time range)  mycophenolate  (CELLCEPT ) capsule 250 mg (has no administration in time range)  tacrolimus   (PROGRAF ) capsule 2 mg (has no administration in time range)    And  tacrolimus  (PROGRAF ) capsule 1 mg (has no administration in time range)  oxymetazoline  (AFRIN) 0.05 % nasal spray 1 spray (has no administration in time range)  cefTRIAXone  (ROCEPHIN ) 1 g in sodium chloride  0.9 % 100 mL IVPB (has no administration in time range)  furosemide  (LASIX ) injection 40 mg (40 mg Intravenous Given 02/17/2024 1856)  insulin  aspart (novoLOG ) injection 0-15 Units (15 Units Subcutaneous Given 02/24/2024 1853)  enoxaparin  (LOVENOX ) injection 40 mg (40 mg Subcutaneous Given 02/17/2024 1856)  acetaminophen  (TYLENOL ) tablet 650 mg (650 mg Oral Given 02/17/2024 1851)    Or  acetaminophen  (TYLENOL ) suppository 650 mg ( Rectal See Alternative 02/08/2024 1851)  ondansetron  (ZOFRAN ) tablet 4 mg (has no administration in time range)    Or  ondansetron  (ZOFRAN ) injection 4 mg (has no administration in time range)  albuterol  (PROVENTIL ) (2.5 MG/3ML) 0.083% nebulizer solution 2.5 mg (has no administration in time range)  methylPREDNISolone  sodium succinate (SOLU-MEDROL ) 250 mg in sodium chloride  0.9 % 50 mL IVPB (has no administration in time range)  alum & mag hydroxide-simeth (MAALOX/MYLANTA) 200-200-20 MG/5ML suspension 30 mL (has no administration in time range)  EPINEPHrine  (ADRENALIN ) 1 MG/10ML injection (has no administration in time range)  EPINEPHrine  (ADRENALIN ) 1 MG/10ML injection (has no administration in time range)  methylPREDNISolone  sodium succinate (SOLU-MEDROL ) 125 mg/2 mL injection 125 mg (125 mg Intravenous Given 02/14/2024 0953)  magnesium  sulfate IVPB 2 g 50 mL (0 g Intravenous Stopped 02/17/2024 1053)  furosemide  (LASIX ) injection 20 mg (20 mg Intravenous Given 02/08/2024 1053)  cefTRIAXone  (ROCEPHIN ) 1 g in sodium chloride  0.9 % 100 mL IVPB (0 g Intravenous Stopped 03/03/2024 1130)  methylPREDNISolone  sodium succinate (SOLU-MEDROL ) 125 mg/2 mL injection 125 mg (125 mg Intravenous Given 03/03/2024 1853)  EPINEPHrine   (ADRENALIN ) 1 MG/10ML injection (1 mg Intravenous Given 02/08/2024 1944)  calcium  chloride injection (1 g Intravenous Given 02/18/2024 1937)  sodium bicarbonate  injection (50 mEq Intravenous Given 03/05/2024 1938)  norepinephrine  (LEVOPHED ) injection (15 mcg Intravenous Given 02/28/2024 1939)  amiodarone  (NEXTERONE ) 1.5 mg/mL IV bolus only (0 mg Intravenous Stopped 03/03/24 0045)  EPINEPHrine  (ADRENALIN ) 1 MG/10ML injection (1 mg Intravenous Given 02/24/2024 1956)  succinylcholine  (ANECTINE )  injection (150 mg Intravenous Given 03/08/2024 1945)    Clinical Course as of 03/07/24 0834  Wed Feb 21, 2024  0951 Patient remains on nonrebreather but is looking a lot more comfortable and is able to slow down his respiratory rate and speaking in full sentences.  May need BiPAP but will hold off for now.  Waiting on labs and imaging. [MB]  [id number] Chest x-ray interpreted by me as significant interstitial lung disease cannot exclude infiltrate bibasilar.  Awaiting radiology reading. [MB]  1031 Patient is resting, better respiratory rate.  100% on nonrebreather.  Have asked respiratory to see if they can wean him down.  Lab work showing elevated white count, not sure if this is due to steroids or infection.  Lab work with some chronic CKD.  Troponin mildly elevated and BNP elevated.  He is on Lasix  so we will order an IV dose. [MB]  1050 Lactate came back at 6.6.  Not crossing over in epic.  Likely due to his work of breathing but cannot exclude infection on that x-ray.  Will cover with antibiotics. [MB]  1050 Exam and x-ray more consistent with fluid overload so we will hold off of IV fluid challenge at this point [MB]  1053 Patient was able to come off nonrebreather to high flow 15 L. [MB]  1130 Discussed with [facility] hospitalist Dr Verdene who will evaluate patient for admission. [MB]    Clinical Course User Index [MB] Towana Ozell BROCKS, MD                                 Medical Decision Making Amount and/or Complexity of  Data Reviewed Labs: ordered. Radiology: ordered.  Risk Prescription drug management.   This patient complains of severe shortness of breath; this involves an extensive number of treatment Options and is a complaint that carries with it a high risk of complications and morbidity. The differential includes COPD, CHF, PE, pneumothorax, pulmonary fibrosis, infection  I ordered, reviewed and interpreted labs, which included CBC with markedly elevated white count, chemistries with some chronic CKD, VBG without significant acidosis, lactate elevated, troponins mildly elevated, BNP elevated, blood culture sent, COVID and flu negative I ordered medication IV steroids IV antibiotics IV Lasix  and reviewed PMP when indicated. I ordered imaging studies which included chest x-ray and I independently    visualized and interpreted imaging which showed pulmonary fibrosis cannot exclude infiltrate or edema Additional history obtained from patient's wife Previous records obtained and reviewed in epic including prior pulmonology notes. I consulted [facility] hospitalist Dr. Verdene and discussed lab and imaging findings and discussed disposition.  Cardiac monitoring reviewed, sinus rhythm Social determinants considered, physically inactive Critical Interventions: Initiation of high flow oxygen , steroids, diuresis for patient's respiratory distress.  Over the course of about 30 minutes patient was able to improve and his work of breathing was able to speak in full sentences and is laying down mostly flat now  After the interventions stated above, I reevaluated the patient and found patient to be feeling much better although clearly will need admission to the hospital Admission and further testing considered, patient requiring admission to the hospital for continued respiratory support and management.  Wife would rather him not go to [facility] and be admitted here instead.      Final diagnoses:  Acute respiratory  failure with hypoxia (HCC)  Pulmonary fibrosis [facility])    ED Discharge Orders  None          Towana Ozell BROCKS, MD March 15, 2024 [phone]  "

## 2024-02-21 NOTE — H&P (Addendum)
 " [facility] History and Physical  William [name] FMW:[id number] DOB: 1950/09/12 DOA: 03-21-2024   PCP: Candise Aleene DEL, MD  Specialists: Followed by [facility] pulmonology.  Followed by transplant clinic at [facility].  Dr. Wonda is his cardiologist  Chief Complaint: Shortness of breath for 1 week  HPI: William [name] is a 74 y.o. male with a past medical history of coronary artery disease status post CABG, chronic respiratory failure on oxygen  at home, history of interstitial lung disease, essential hypertension, history of liver transplant, status post TAVR who has been following with pulmonology at [facility] for interstitial lung disease.  Etiology has been unclear.  Patient has been on chronic steroids for same.  He has been undergoing pulmonary rehab as well but for the past week or so he has been feeling worse than usual.  Based on records from [facility] it appears that he has been having exacerbations on and off for the past 1 to 2 months.  He has not been able to keep up with his pulmonary rehab.  At baseline he uses anywhere from 6 to 10 L of oxygen  at rest and increases to 15 L when he is exerting himself.  He has felt very fatigued as result of his symptoms.  He denies any fever or chills.  No sick contacts.  Has had occasional cough with clear expectoration.  Denies any swelling in his legs.  His weight has not changed much in the last few months.  Denies any chest pain.  In the emergency department he was initially noted to be saturating in the 70s.  He was placed on a nonrebreather.  Evaluation raised concern for pulmonary fibrosis versus volume overload versus pneumonia.  He was hospitalized for further management.  Home Medications: Prior to Admission medications  Medication Sig Start Date End Date Taking? Authorizing Provider  acetaminophen  (TYLENOL ) 500 MG tablet Take 1,000 mg by mouth as needed.    [provider]  amLODipine  (NORVASC ) 10 MG tablet Take 1 tablet (10 mg total) by  mouth daily. 01/26/23   McGowen, Aleene DEL, MD  ammonium lactate  (AMLACTIN) 12 % cream Apply 2 times daily to forearms and hands in preparation for treatment with Efudex  (fluorouracil ) Patient taking differently: Apply 1 Application topically as needed for dry skin. 02/10/22     amoxicillin  (AMOXIL ) 500 MG capsule Prophylaxis for dental procedure. Take 2 grams (4x 500mg  tablets) 1 hour before procedure. 11/07/23   Wonda Sharper, MD  aspirin  EC 81 MG tablet Take 81 mg by mouth daily. Swallow whole.    [provider]  furosemide  (LASIX ) 20 MG tablet Take 2 tablets (40 mg total) by mouth once daily 02/13/24   McGowen, Aleene DEL, MD  metFORMIN  (GLUCOPHAGE ) 500 MG tablet Take 1 tablet (500 mg total) by mouth 2 (two) times daily with a meal. 07/24/23   McGowen, Aleene DEL, MD  metoprolol  tartrate (LOPRESSOR ) 100 MG tablet Take 1 tablet (100 mg total) by mouth 2 (two) times daily. 01/01/24   McGowen, Aleene DEL, MD  mycophenolate  (CELLCEPT ) 250 MG capsule Take 2 capsules by mouth in the morning and 1 capsule in the evening. 03/03/23     pantoprazole  (PROTONIX ) 40 MG tablet Take 1 tablet (40 mg total) by mouth daily. 01/16/24     predniSONE  (DELTASONE ) 10 MG tablet Take 2 tablets (20 mg total) by mouth daily. 01/24/24     rosuvastatin  (CRESTOR ) 10 MG tablet Take 1 tablet (10 mg total) by mouth daily. 08/28/23 08/22/24  Lelon,  Glendia DASEN, PA-C  Specialty Vitamins Products (MG PLUS PROTEIN) 133 MG TABS Take 1 tablet (133 mg total) by mouth 2 (two) times daily at lunch and dinner 01/29/24     tacrolimus  (PROGRAF ) 1 MG capsule Take 2 capsules (2 mg total) by mouth every morning AND 1 capsule (1 mg total) every evening. 08/31/23     triamcinolone  cream (KENALOG ) 0.1 % Apply 2 times a day to rash. Follow with CeraVe Cream.  Stop when smooth. 11/28/22       Allergies: Allergies[1]  Past Medical History: Past Medical History:  Diagnosis Date   AAA (abdominal aortic aneurysm) 03/2014   repaired 2020   Ascending  aortic aneurysm    unrepaired. Stable at 4.3 cm on imaging by [facility] thoracic surg 10/2019 and 10/2020.   Bilateral renal cysts    simple (03/2014 MRI)   CAD (coronary artery disease)    Cholelithiases 2018   asymptomatic   Chronic diastolic heart failure (HCC)    Chronic renal insufficiency, stage 3 (moderate) 2022   baseline sCr 1.3-1.5 (GFR 55)   COVID-19 virus infection 02/2020   sotrovimab  infusion   Diabetes mellitus with complication (HCC) 07/2014   A1c 6.8%. A1c 5.4% 11/2019   Diverticulosis    Gout    initially dx'd by diag arthrocentesis of elbow effusion in winter 2021, 3 episodes, all came after being started on chlorthalidone ->I d/c'd chlorthal.   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  Improving + off oxygen  as of 3 mo s/p liver transplant.  Doing well/resolved as of 02/2020 [facility] pulm f/u->they'll continue to follow. No worsening on CT imaging at [facility] 03/2023   History of blood transfusion 2018 X 4 dates   Chronic GI blood loss of unknown location despite full GI endoscopic eval, etc   History of liver transplant (HCC) 07/2019   [facility]   Hyperkalemia    Lokelma    Hyperlipemia, mixed    elevated LFTs when on statins.     Hypertension    Cr bump 04/01/16 so I changed benicar -hct to benicar  plain and added amlodipine  5 mg.   Increased prostate specific antigen (PSA) velocity 2021   0.11 to 4.77 from 2020 to 2021 (he got a liver transplant and was put on antirejection meds between these psa checks).   Iron  deficiency anemia 05/2016   Acute blood loss anemia: hospitalized, required transfusion x 3 U: colonoscopy and capsule study unrevealing.  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  W/u for ? hemolytic anemia to be pursued by hematologist as outpt.  Dr. Charlean, GI at [facility] following, too---he rec'd onc do bone marrow bx as of Jan 2019   Iron  deficiency anemia due to chronic blood loss  2018/19   GI: transfusions x >20 required; multiple endoscopies and bleeding scans unrevealing. Lysteda  and octreotide  + monthly iron  infusions as of 04/2017. Iron  infusions changed to every other month as of 09/2018 hem f/u.   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  Changes noted on all CXRs in 2018/2019.  Liver Transplant eval 03/2018->mild restriction on PFTs but no  obstruction.  See further details in [facility] pulm fibrosis section   Microscopic hematuria    Eval unremarkable by Dr. Maryanne.   Nonmelanoma skin cancer 08/15/2018   2020 BCC nose, excised.  2022 R side of neck->mohs   Obesity    Pulmonary fibrosis (HCC)    PFTs: restrictive lung dz ([facility] transplant eval 05/2018). [facility] pulm eval felt this was likely hepatopulmonary syndrome->causing his hypoxia->low likelihood of progression (as of 10/04/18 transplant clinic f/u). Pulm rehab helping as of 2020/2021. OFF oxygen  as of 10/2019 transp f/u.   Pulmonary fibrosis (HCC)    Gradual worsening of objective testing 2020 05/2023, has had return of oxygen  requirement, diffuse parenchymal lung disease of unclear etiology.   Spleen enlarged     Past Surgical History:  Procedure Laterality Date   ABDOMINAL AORTIC ANEURYSM REPAIR  08/23/2018   [facility]   CARDIAC CATHETERIZATION     05/2023 severe 3 vessel obstructive CAD, 1 of 3 bypass grafts patent.  Mod AS   Cardiac CT     04/07/23 OCCLUDED SAPHENOUS VEIN GRAFTS   CARDIOVASCULAR STRESS TEST  01/17/2012; 05/04/16   Normal stress nuclear study x 2 (2018--Normal perfusion. LVEF 66% with normal wall motion. This is a low risk study).   CERVICAL DISCECTOMY  1992   COLONOSCOPY N/A  05/27/2016   No site/explanation for blood loss found.  Erythematous mucosa in cecum and ascending colon--Cecal bx: normal.  Procedure: COLONOSCOPY;  Surgeon: Belvie Just, MD;  [facility] ENDOSCOPY;  Service: Endoscopy;  Laterality: N/A;   COLONOSCOPY     01/07/22 diverticulosis but NO POLYPS.  No further recommended   COLONOSCOPY W/ POLYPECTOMY  09/10/2014   Polypectomy x 2: recall 5 yrs (Dr. Kristie).   COLONOSCOPY WITH PROPOFOL  N/A 01/07/2022   NO FURTHER SCREENING INDICATED.  Procedure: COLONOSCOPY WITH PROPOFOL ;  Surgeon: Just Belvie, MD;  Location: [facility] ENDOSCOPY;  Service: Gastroenterology;  Laterality: N/A;   CORONARY ANGIOPLASTY     CORONARY ARTERY BYPASS GRAFT  03/2006   CT ABDOMEN WITH CONTRAST ([facility] HX)  11/2018   CT CHEST WWO CONTAST ([facility] HX)  07/11/2019   No significant change in interstitial lung disease with possible underlying hepatopulmonary syndrome, Increased ascites in the setting of cirrhosis, tiny new right basilar nodule   DEXA  03/07/2019   T score 0.2/NORMAL   ENTEROSCOPY N/A 07/31/2016   Procedure: ENTEROSCOPY;  Surgeon: Aneita Gwendlyn DASEN, MD;  Location: [facility] ENDOSCOPY;  Service: Endoscopy;  Laterality: N/A;   ENTEROSCOPY N/A 12/02/2016   Procedure: ENTEROSCOPY;  Surgeon: Just Belvie, MD;  Location: [facility] ENDOSCOPY;  Service: Endoscopy;  Laterality: N/A;   ESOPHAGOGASTRODUODENOSCOPY N/A 05/25/2016   No site/explanation for blood loss found.  Procedure: ESOPHAGOGASTRODUODENOSCOPY (EGD);  Surgeon: Renaye Kristie, MD;  Location: [facility] ENDOSCOPY;  Service: Endoscopy;  Laterality: N/A;   ESOPHAGOGASTRODUODENOSCOPY N/A 09/23/2016   Procedure: ESOPHAGOGASTRODUODENOSCOPY (EGD);  Surgeon: Just Belvie, MD;  Location: [facility] ENDOSCOPY;  Service: Endoscopy;  Laterality: N/A;   ESOPHAGOGASTRODUODENOSCOPY (EGD) WITH PROPOFOL  N/A 08/21/2018   Procedure: ESOPHAGOGASTRODUODENOSCOPY (EGD) WITH PROPOFOL ;  Surgeon: Just Belvie, MD;  Location: [facility] ENDOSCOPY;  Service: Endoscopy;  Laterality: N/A;    GIVENS CAPSULE STUDY N/A 05/27/2016   No source identified.  Repeat 10/2016--results pending.  Procedure: GIVENS CAPSULE STUDY;  Surgeon: Belvie Just, MD;  Location: [facility] ENDOSCOPY;  Service: Endoscopy;  Laterality: N/A;   GIVENS CAPSULE STUDY N/A 10/15/2016   Procedure: GIVENS CAPSULE STUDY;  Surgeon: Just Belvie, MD;  Location: [facility] ENDOSCOPY;  Service: Endoscopy;  Laterality: N/A;  GIVENS CAPSULE STUDY N/A 02/13/2017   Procedure: GIVENS CAPSULE STUDY;  Surgeon: Rollin Dover, MD;  Location: [facility] ENDOSCOPY;  Service: Endoscopy;  Laterality: N/A;   LIVER TRANSPLANT  08/02/2019   For NASH cirrhosis. [facility]   NECK SURGERY  1991   NM GI BLOOD LOSS  01/27/2017   NEGATIVE   SMALL BOWEL ENTEROSCOPY  10/2016   ([facility], Dr. Charlean: Double balloon enteroscopy--to the level of proximal ileum) jejunal polyps- which were removed--not bleeding & benign.  No source of bleeding was identified.   TRANSCATHETER AORTIC VALVE REPLACEMENT, TRANSFEMORAL     06/2023   TRANSTHORACIC ECHOCARDIOGRAM  05/25/2016; 10/2017   05/2016: EF 60%, normal wall motion, grd II DD, mild aortic stenosis, dilated aortic root and ascending aorta. 10/2017: Mild LVH; no mention made of LV function.  Moderate aorticstenosis with mean gradient 19 peak gradient 41. AVA 1.1 cm^2.  05/2018 EF 55%,4.3 cm ascend aneur, mild AS. 10/2020 EF 60-65%, normal diast fxn, mod AV sclerosis w/out sten/regurg.   TRANSTHORACIC ECHOCARDIOGRAM     10/27/22; normal LV and RV fxn,+DD, mild LVH, mod AS, otherwise valves fine.  No change 03/2023.   VASECTOMY  1977    Social History: Lives with wife.  No current smoking use.  No alcohol use.  Family History:  Family History  Problem Relation Age of Onset   Hypertension Mother    Cancer - Other Mother        liver cancer   Heart disease Father    Heart attack Father    Cancer - Lung Father    Diabetes Father    Liver disease Sister    Anemia Sister    Heart disease Sister    Heart attack Sister    Heart  disease Brother        CABG 20 YEARS AGO   Hypertension Brother    Mesothelioma Brother        HALF-BROTHER   Cancer - Other Brother        CLL   Cancer - Lung Brother        Mets to brain   Cancer - Lung Sister        HALF-SISTER   Liver disease Brother    Heart disease Brother        CABG-2012   Emphysema Maternal Grandfather    Cancer - Other Paternal Grandmother        Stomach   Heart attack Paternal Grandfather      Review of Systems - History obtained from the patient General ROS: positive for  - fatigue Psychological ROS: positive for - anxiety Ophthalmic ROS: negative ENT ROS: negative Allergy and Immunology ROS: negative Hematological and Lymphatic ROS: negative Endocrine ROS: negative Respiratory ROS: As in HPI Cardiovascular ROS: As in HPI Gastrointestinal ROS: no abdominal pain, change in bowel habits, or black or bloody stools Genito-Urinary ROS: no dysuria, trouble voiding, or hematuria Musculoskeletal ROS: negative Neurological ROS: no TIA or stroke symptoms Dermatological ROS: negative  Physical Examination  Vitals:   2024-03-10 1048 10-Mar-2024 1100 03/10/2024 1150 10-Mar-2024 1300  BP:  123/80 127/76 109/76  Pulse:  71 78 78  Resp:  18 (!) 23 20  Temp: 97.7 F (36.5 C)     TempSrc: Oral     SpO2:  92% 90% 90%    BP 109/76   Pulse 78   Temp 97.7 F (36.5 C) (Oral)   Resp 20   SpO2 90%   General appearance: alert, cooperative, appears stated  age, and no distress Head: Normocephalic, without obvious abnormality, atraumatic Eyes: conjunctivae/corneas clear. PERRL, EOM's intact.  Throat: lips, mucosa, and tongue normal; teeth and gums normal Neck: no adenopathy, no carotid bruit, no JVD, supple, symmetrical, trachea midline, and thyroid  not enlarged, symmetric, no tenderness/mass/nodules Resp: Tachypneic without use of accessory muscles.  Crackles bilateral bases.  No wheezing or rhonchi. Cardio: regular rate and rhythm, S1, S2 normal, no murmur,  click, rub or gallop GI: soft, non-tender; bowel sounds normal; no masses,  no organomegaly Extremities: extremities normal, atraumatic, no cyanosis or edema Pulses: 2+ and symmetric Skin: Skin color, texture, turgor normal. No rashes or lesions Lymph nodes: Cervical, supraclavicular, and axillary nodes normal. Neurologic: No focal neurological deficits.  She is alert and oriented x 3.  Cranial nerves II to XII intact.   Labs on Admission: I have personally reviewed following labs and imaging studies  CBC: Recent Labs  Lab 2024/03/16 0937 03/16/24 0952  WBC 29.4*  --   NEUTROABS 24.4*  --   HGB 16.8 16.7  HCT 50.1 49.0  MCV 91.3  --   PLT 223  --    Basic Metabolic Panel: Recent Labs  Lab 03-16-24 0937 2024-03-16 0952  NA 137 135  K 4.7 4.6  CL 95*  --   CO2 22  --   GLUCOSE 183*  --   BUN 28*  --   CREATININE 1.46*  --   CALCIUM  9.7  --    GFR: CrCl cannot be calculated (Unknown ideal weight.). Liver Function Tests: Recent Labs  Lab 03-16-2024 0937  AST 31  ALT 24  ALKPHOS 69  BILITOT 2.0*  PROT 6.9  ALBUMIN 4.5   Coagulation Profile: Recent Labs  Lab 03-16-24 0937  INR 1.0    BNP (last 3 results) Recent Labs    11/30/23 1440 16-Mar-2024 0937  PROBNP 1,401.0* 5,205.0*     Radiological Exams on Admission: DG Chest Port 1 View Result Date: 2024/03/16 CLINICAL DATA:  Shortness of breath EXAM: PORTABLE CHEST 1 VIEW COMPARISON:  November 30, 2023 FINDINGS: Stable cardiomegaly. 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 Stable bibasilar reticular opacities are noted most consistent with pulmonary fibrosis or other chronic interstitial lung disease. No definite acute pulmonary abnormality is noted. Bony thorax is unremarkable. IMPRESSION: Stable bibasilar reticular opacities most consistent with pulmonary fibrosis or other chronic interstitial lung disease. No definite acute pulmonary abnormality is noted. Electronically  Signed   By: Lynwood Landy Raddle M.D.   On: 2024-03-16 10:21    My interpretation of Electrocardiogram: Crackles at bilateral bases.  No wheezing or rhonchi.  Sinus rhythm in the 80s.  Normal axis.  Interventricular conduction delay is noted.  Nonspecific T wave changes.  PVC.   Problem List  Principal Problem:   Acute on chronic respiratory failure with hypoxia (HCC) Active Problems:   Essential hypertension   Acute on chronic diastolic CHF (congestive heart failure) (HCC)   Liver transplant status (HCC)   Pulmonary fibrosis (HCC)   Assessment: This is a 74 year old male with past medical history as stated earlier comes in with 1 week history of progressively worsening shortness of breath.  Etiology is likely multifactorial but appears to be primarily a worsening of his interstitial lung disease.  Does not appear to be too volume overloaded at this time.  Unlikely to be infectious etiology.  Plan:  Acute on chronic respiratory failure with hypoxia/interstitial lung disease exacerbation/volume overload Recently done echocardiogram at [facility]  showed normal LVEF.  Diastolic parameters were indeterminate.  Patient uses high amounts of oxygen  at home at baseline.  6 to 10 L at rest and 5 to 15 L or more with exertion. Patient's shortness of breath has worsened in the last 1 week or so.  Chest x-ray shows chronic changes.  COVID-19 influenza RSV PCR negative. Patient was given furosemide  and steroids.  He is requiring heated high flow oxygen .  Does not want to be transferred to [facility] if possible.  Will request pulmonology to see him.  Will continue with furosemide  steroids and antibiotics for now.  May need a CT scan which will be deferred to pulmonology.  Patient is a full code. ABG reviewed.  Elevated troponin level likely of no clinical significance.  Elevated BNP level also noted.  History of liver transplant This was about 5 years ago.  Followed at [facility].  LFTs are stable.  Continue with his  immunosuppressants including CellCept  and tacrolimus .  Chronic kidney disease stage IIIb Renal function seems to be close to baseline.  Leukocytosis Could be from steroid use.  Could be from acute infection.  Continue to monitor.  He is however afebrile.  Diabetes mellitus type 2 Hold metformin .  CBGs will be checked.  SSI.  Essential hypertension Amlodipine  on hold.  Will resume metoprolol  from tomorrow at a lower dose.   DVT Prophylaxis: Lovenox  Code Status: Full code Family Communication: Discussed with his wife and son Disposition: Home when improved Consults called: Pulmonology Admission Status: Status is: Inpatient Remains inpatient appropriate because: Acute on chronic respiratory failure with hypoxia    Severity of Illness: The appropriate patient status for this patient is INPATIENT. Inpatient status is judged to be reasonable and necessary in order to provide the required intensity of service to ensure the patient's safety. The patient's presenting symptoms, physical exam findings, and initial radiographic and laboratory data in the context of their chronic comorbidities is felt to place them at high risk for further clinical deterioration. Furthermore, it is not anticipated that the patient will be medically stable for discharge from the hospital within 2 midnights of admission.   * I certify that at the point of admission it is my clinical judgment that the patient will require inpatient hospital care spanning beyond 2 midnights from the point of admission due to high intensity of service, high risk for further deterioration and high frequency of surveillance required.*   Further management decisions will depend on results of further testing and patient's response to treatment.   William [name]  [facility] Pager on [email or web address]  03-19-24, 2:10 PM     [1]  Allergies Allergen Reactions   Niacinamide Itching    Improved upon discontinuation     Thiazide-Type Diuretics Other (See Comments)    Chlorthalidone  caused gout   Quinolones Other (See Comments)    CONTRAINDICATED D/T PT HAVING AAA   "

## 2024-02-21 NOTE — ED Notes (Signed)
 RT called to decrease pt O2 and see how pt does per Dr. Towana.

## 2024-02-21 NOTE — ED Triage Notes (Signed)
 Pt arrives via POV. Pt reports he has been experiencing worsening sob over the past week. PT is AxOx4. He is tripoding and is very tachypnic during triage. RT and EDP at bedside. PT is currently 71% on a NRB.

## 2024-02-21 NOTE — Consult Note (Signed)
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  Worsening shortness of breath over the last several.  More hypoxemic.  When arrived in the ED reportedly oxygen  saturation was 71% on nonrebreather.  Chest x-ray on my review interpretation reveals bibasilar interstitial infiltrates chronic with concern for new pleural effusions with obscuration of bilateral heart border is not there or not present on prior most recent chest x-ray 11/2023.  Lab notable for significant leukocytosis with left shift.  ENP markedly elevated.  CT chest ordered by me reveals no pleural effusions but increased in severity and distribution interlobular septal thickening, faint ground glass, traction bronchiectasis compared to most recent CT scan I can see 04/2018.  He was treated with antibiotics.  IV steroids.  Lasix  was given.  He felt the Lasix  dose was adequate, he peed quite a bit per his report.  No fever.  No real infectious symptoms cough etc.  Pertinent  Medical History  History of liver transplant  Significant Hospital Events: Including procedures, antibiotic start and stop dates in addition to other pertinent events     Interim History / Subjective:    Objective    Blood pressure 121/71, pulse 79, temperature 98.4 F (36.9 C), temperature source Oral, resp. rate 16, SpO2 91%.    FiO2 (%):  [100 %] 100 %  No intake or output data in the 24  hours ending 02/29/2024 1519 There were no vitals filed for this visit.  Examination: General: Lying in stretcher, ill-appearing HENT: Atraumatic normocephalic heated high flow in place Lungs: Mildly tachypneic although normal work of breathing on heated high flow Cardiovascular: Regular rate and rhythm Abdomen: Nondistended Extremities: No edema Neuro: Alert oriented no deficits noted   Resolved problem list   Assessment and Plan   Acute on chronic hypoxemic respiratory failure: Differential includes volume overload, infection, and flare of underlying pulmonary fibrosis.  He denies infectious symptoms.  CT scan does not demonstrate significant infiltrate or concern for infection.  CT scan does reveal some increased interlobular septal thickening which could be representative of pulmonary edema versus worsening fibrosis.  Scattered mild ground glass opacities again pulmonary fibrosis versus active inflammation and worsening fibrosis.  BNP is markedly elevated.  Although degree of volume overload on CT scan is mild if present.  Suspect this is a combination of flare of underlying interstitial lung disease, mild volume overload given his known congestive heart failure, as well as worsening pulmonary hypertension as demonstrated on right heart catheterization 11/2023. -- Goal oxygen  saturation 88%, wean as tolerated, continue heated high flow -- Most recent home documented oxygen  11/2023 when discharged from [facility] was 10 L at rest, 15 L nonrebreather with exertion -- Continued antibiotics for CAP coverage is reasonable  Decompensated congestive heart failure: CAD, aortic valve replacement in the past.  Wedge pressure markedly elevated in the 20s on heart catheterization 11/2023.  Fortunately, not a lot of left-sided volume overload noted on CT scan. -- Aggressive IV diuresis as blood  pressure and kidney function allows  ILD/pulmonary fibrosis: With concern for flare. 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 -- Aggressive IV diuresis as blood pressure and kidney function allows  Discussed with family at bedside.  In particular with wife.  We discussed advanced directives.  Recently he has expressed that if there was a reversible cause for his respite failure then he would be in favor for a short duration or trial on mechanical ventilation.  If it was felt that the reason for his decompensation was irreversible then it sounds like he would not be in favor of mechanical ventilation and more in line with DNR.  Currently full code.  Hard to tell trajectory for now so no recommendations in regards to CODE STATUS at this time.  I think with time if things are improving that would be great.  If not, would need to readdress CODE STATUS.  Labs   CBC: Recent Labs  Lab 02/23/2024 0937 02/24/2024 0952  WBC 29.4*  --   NEUTROABS 24.4*  --   HGB 16.8 16.7  HCT 50.1 49.0  MCV 91.3  --   PLT 223  --     Basic Metabolic Panel: Recent Labs  Lab 02/29/2024 0937 03/06/2024 0952  NA 137 135  K 4.7 4.6  CL 95*  --   CO2 22  --   GLUCOSE 183*  --   BUN 28*  --   CREATININE 1.46*  --   CALCIUM  9.7  --    GFR: CrCl cannot be calculated (Unknown ideal weight.). Recent Labs  Lab 02/26/2024 0937 03/05/2024 0953 02/25/2024 1206  WBC 29.4*  --   --   LATICACIDVEN  --  6.6* 2.9*    Liver Function Tests: Recent Labs  Lab 02/09/2024 0937  AST 31  ALT 24  ALKPHOS 69  BILITOT 2.0*  PROT 6.9  ALBUMIN 4.5   No results for input(s): LIPASE, AMYLASE in the last 168 hours. No results for input(s): AMMONIA in the last 168 hours.  ABG    Component Value Date/Time   HCO3 25.2 03/09/2024 0952   TCO2  26 02/23/2024 0952   O2SAT 93 03/05/2024 0952     Coagulation Profile: Recent Labs  Lab 03/01/2024 0937  INR 1.0    Cardiac Enzymes: No results for input(s): CKTOTAL, CKMB, CKMBINDEX, TROPONINI in the last 168 hours.  HbA1C: Hemoglobin A1C  Date/Time Value Ref Range Status  01/26/2023 09:24 AM 5.6 4.0 - 5.6 % Final  10/24/2022 08:48 AM 5.7 (A) 4.0 - 5.6 % Final  05/15/2018 12:00 AM 4.7  Final   HbA1c, POC (prediabetic range)  Date/Time Value Ref Range Status  01/26/2023 09:24 AM 5.6 (A) 5.7 - 6.4 % Final  10/24/2022 08:48 AM 5.7 5.7 - 6.4 % Final   HbA1c, POC (controlled diabetic range)  Date/Time Value Ref Range Status  01/26/2023 09:24 AM 5.6 0.0 - 7.0 % Final  10/24/2022 08:48 AM 5.7 0.0 - 7.0 % Final   HbA1c POC (<> result, manual entry)  Date/Time Value Ref Range Status  01/26/2023 09:24 AM 5.6 4.0 - 5.6 % Final  10/24/2022 08:48 AM 5.7 4.0 - 5.6 % Final   Hgb A1c  MFr Bld  Date/Time Value Ref Range Status  12/15/2023 03:42 PM 6.6 (H) <5.7 % Final    Comment:    For someone without known diabetes, a hemoglobin A1c value of 6.5% or greater indicates that they may have  diabetes and this should be confirmed with a follow-up  test. . For someone with known diabetes, a value <7% indicates  that their diabetes is well controlled and a value  greater than or equal to 7% indicates suboptimal  control. A1c targets should be individualized based on  duration of diabetes, age, comorbid conditions, and  other considerations. . Currently, no consensus exists regarding use of hemoglobin A1c for diagnosis of diabetes for children. .     CBG: No results for input(s): GLUCAP in the last 168 hours.  Review of Systems:   Comprehensive review of systems negative unless mentioned in HPI above  Past Medical History:  He,  has a past medical history of AAA (abdominal aortic aneurysm) (03/2014), Ascending aortic aneurysm, Bilateral renal cysts, CAD (coronary  artery disease), Cholelithiases (2018), Chronic diastolic heart failure (HCC), Chronic renal insufficiency, stage 3 (moderate) (2022), COVID-19 virus infection (02/2020), Diabetes mellitus with complication (HCC) (07/2014), Diverticulosis, Gout, Hepatopulmonary syndrome (HCC), History of blood transfusion (2018 X 4 dates), History of liver transplant (HCC) (07/2019), Hyperkalemia, Hyperlipemia, mixed, Hypertension, Increased prostate specific antigen (PSA) velocity (2021), Iron  deficiency anemia (05/2016), Iron  deficiency anemia due to chronic blood loss (2018/19), LBBB (left bundle branch block), Leukocytosis, 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   Surgical History:   Past Surgical History:  Procedure Laterality Date   ABDOMINAL AORTIC ANEURYSM REPAIR  08/23/2018   [facility]   CARDIAC CATHETERIZATION     05/2023 severe 3 vessel obstructive CAD, 1 of 3 bypass grafts patent.  Mod AS   Cardiac CT     04/07/23 OCCLUDED SAPHENOUS VEIN GRAFTS   CARDIOVASCULAR STRESS TEST  01/17/2012; 05/04/16   Normal stress nuclear study x 2 (2018--Normal perfusion. LVEF 66% with normal wall motion. This is a low risk study).   CERVICAL DISCECTOMY  1992   COLONOSCOPY N/A 05/27/2016   No site/explanation for blood loss found.  Erythematous mucosa in cecum and ascending colon--Cecal bx: normal.  Procedure: COLONOSCOPY;  Surgeon: Belvie Just, MD;  Location: [facility] ENDOSCOPY;  Service: Endoscopy;  Laterality: N/A;   COLONOSCOPY     01/07/22 diverticulosis but NO POLYPS.  No further recommended   COLONOSCOPY W/ POLYPECTOMY  09/10/2014   Polypectomy x 2: recall 5 yrs (Dr. Kristie).   COLONOSCOPY WITH PROPOFOL  N/A 01/07/2022   NO FURTHER SCREENING INDICATED.  Procedure: COLONOSCOPY WITH PROPOFOL ;  Surgeon: Just Belvie, MD;  Location: [facility] ENDOSCOPY;  Service: Gastroenterology;  Laterality: N/A;   CORONARY ANGIOPLASTY     CORONARY ARTERY BYPASS GRAFT  03/2006   CT ABDOMEN WITH CONTRAST ([facility] HX)  11/2018   CT CHEST WWO CONTAST ([facility] HX)  07/11/2019   No significant change in interstitial lung disease with possible underlying hepatopulmonary syndrome, Increased ascites in the setting of cirrhosis, tiny new right basilar nodule   DEXA  03/07/2019   T score 0.2/NORMAL   ENTEROSCOPY N/A 07/31/2016   Procedure: ENTEROSCOPY;  Surgeon: Aneita Gwendlyn DASEN, MD;  Location: [facility] ENDOSCOPY;  Service: Endoscopy;  Laterality: N/A;   ENTEROSCOPY N/A 12/02/2016   Procedure: ENTEROSCOPY;  Surgeon: Just Belvie, MD;  Location: [facility] ENDOSCOPY;  Service: Endoscopy;  Laterality: N/A;   ESOPHAGOGASTRODUODENOSCOPY N/A 05/25/2016   No site/explanation for blood loss found.  Procedure: ESOPHAGOGASTRODUODENOSCOPY (EGD);  Surgeon: Renaye Sous, MD;  Location: [facility] ENDOSCOPY;  Service: Endoscopy;  Laterality: N/A;   ESOPHAGOGASTRODUODENOSCOPY N/A 09/23/2016   Procedure: ESOPHAGOGASTRODUODENOSCOPY (EGD);  Surgeon: Rollin Dover, MD;  Location: [facility] ENDOSCOPY;  Service: Endoscopy;  Laterality: N/A;   ESOPHAGOGASTRODUODENOSCOPY (EGD) WITH PROPOFOL  N/A 08/21/2018   Procedure: ESOPHAGOGASTRODUODENOSCOPY (EGD) WITH PROPOFOL ;  Surgeon: Rollin Dover, MD;  Location: [facility] ENDOSCOPY;  Service: Endoscopy;  Laterality: N/A;   GIVENS CAPSULE STUDY N/A 05/27/2016   No source identified.  Repeat 10/2016--results pending.  Procedure: GIVENS CAPSULE STUDY;  Surgeon: Dover Rollin, MD;  Location: [facility] ENDOSCOPY;  Service: Endoscopy;  Laterality: N/A;   GIVENS CAPSULE STUDY N/A 10/15/2016   Procedure: GIVENS CAPSULE STUDY;  Surgeon: Rollin Dover, MD;  Location: [facility] ENDOSCOPY;  Service: Endoscopy;  Laterality: N/A;   GIVENS CAPSULE STUDY N/A 02/13/2017   Procedure: GIVENS CAPSULE STUDY;  Surgeon: Rollin Dover, MD;  Location: [facility] ENDOSCOPY;  Service: Endoscopy;   Laterality: N/A;   LIVER TRANSPLANT  08/02/2019   For NASH cirrhosis. [facility]   NECK SURGERY  1991   NM GI BLOOD LOSS  01/27/2017   NEGATIVE   SMALL BOWEL ENTEROSCOPY  10/2016   ([facility], Dr. Charlean: Double balloon enteroscopy--to the level of proximal ileum) jejunal polyps- which were removed--not bleeding & benign.  No source of bleeding was identified.   TRANSCATHETER AORTIC VALVE REPLACEMENT, TRANSFEMORAL     06/2023   TRANSTHORACIC ECHOCARDIOGRAM  05/25/2016; 10/2017   05/2016: EF 60%, normal wall motion, grd II DD, mild aortic stenosis, dilated aortic root and ascending aorta. 10/2017: Mild LVH; no mention made of LV function.  Moderate aorticstenosis with mean gradient 19 peak gradient 41. AVA 1.1 cm^2.  05/2018 EF 55%,4.3 cm ascend aneur, mild AS. 10/2020 EF 60-65%, normal diast fxn, mod AV sclerosis w/out sten/regurg.   TRANSTHORACIC ECHOCARDIOGRAM     10/27/22; normal LV and RV fxn,+DD, mild LVH, mod AS, otherwise valves fine.  No change 03/2023.   VASECTOMY  1977     Social History:   reports that he quit smoking about 43 years ago. His smoking use included cigarettes. He started smoking about 60 years ago. He has a 8.5 pack-year smoking history. He has never used smokeless tobacco. He reports that he does not drink alcohol and does not use drugs.   Family History:  His family history includes Anemia in his sister; Cancer - Lung in his brother, father, and sister; Cancer - Other in his brother, mother, and paternal grandmother; Diabetes in his father; Emphysema in his maternal grandfather; Heart attack in his father, paternal grandfather, and sister; Heart disease in his brother, brother, father, and sister; Hypertension in his brother and mother; Liver disease in his brother and sister; Mesothelioma in his brother.   Allergies Allergies[1]   Home Medications  Prior to Admission medications  Medication Sig Start Date End Date Taking? Authorizing Provider  acetaminophen  (TYLENOL ) 500 MG tablet  Take 1,000 mg by mouth as needed.   Yes [provider]  amLODipine  (NORVASC ) 10 MG tablet Take 1 tablet (10 mg total) by mouth daily. 01/26/23  Yes McGowen, Aleene DEL, MD  amoxicillin  (AMOXIL ) 500 MG capsule Prophylaxis for dental procedure. Take 2 grams (4x 500mg  tablets) 1 hour before procedure. 11/07/23  Yes Wonda Sharper, MD  aspirin  EC 81 MG tablet Take 81 mg by mouth daily. Swallow whole.  Yes [provider]  furosemide  (LASIX ) 20 MG tablet Take 2 tablets (40 mg total) by mouth once daily 02/13/24  Yes McGowen, Aleene DEL, MD  metFORMIN  (GLUCOPHAGE ) 500 MG tablet Take 1 tablet (500 mg total) by mouth 2 (two) times daily with a meal. 07/24/23  Yes McGowen, Aleene DEL, MD  metoprolol  tartrate (LOPRESSOR ) 100 MG tablet Take 1 tablet (100 mg total) by mouth 2 (two) times daily. 01/01/24  Yes McGowen, Aleene DEL, MD  mycophenolate  (CELLCEPT ) 250 MG capsule Take 2 capsules by mouth in the morning and 1 capsule in the evening. 03/03/23  Yes   oxymetazoline  (AFRIN) 0.05 % nasal spray Place 1 spray into both nostrils 2 (two) times daily as needed for congestion.   Yes [provider]  pantoprazole  (PROTONIX ) 40 MG tablet Take 1 tablet (40 mg total) by mouth daily. 01/16/24  Yes   predniSONE  (DELTASONE ) 10 MG tablet Take 2 tablets (20 mg total) by mouth daily. 01/24/24  Yes   rosuvastatin  (CRESTOR ) 10 MG tablet Take 1 tablet (10 mg total) by mouth daily. 08/28/23 08/22/24 Yes Lelon Hamilton T, PA-C  Specialty Vitamins Products (MG PLUS PROTEIN) 133 MG TABS Take 1 tablet (133 mg total) by mouth 2 (two) times daily at lunch and dinner 01/29/24  Yes   tacrolimus  (PROGRAF ) 1 MG capsule Take 2 capsules (2 mg total) by mouth every morning AND 1 capsule (1 mg total) every evening. 08/31/23  Yes      Critical care time: n/a     Donnice JONELLE Beals, MD See TRACEY         [1]  Allergies Allergen Reactions   Niacinamide Itching    Improved upon discontinuation    Thiazide-Type  Diuretics Other (See Comments)    Chlorthalidone  caused gout   Quinolones Other (See Comments)    CONTRAINDICATED D/T PT HAVING AAA   "

## 2024-02-21 NOTE — Code Documentation (Signed)
Patient time of death occurred at 1958.

## 2024-02-21 NOTE — ED Provider Notes (Addendum)
.  Critical Care  Performed by: Ruthe Cornet, DO Authorized by: Ruthe Cornet, DO   Critical care provider statement:    Critical care time (minutes):  45   Critical care was necessary to treat or prevent imminent or life-threatening deterioration of the following conditions:  Respiratory failure   Critical care was time spent personally by me on the following activities:  Evaluation of patient's response to treatment, examination of patient, obtaining history from patient or surrogate, ordering and performing treatments and interventions, pulse oximetry, re-evaluation of patient's condition and review of old charts   While patient was in the ED waiting for his hospital bed after being admitted earlier this evening he lost pulses and I responded to the code.  We were able to intubate the patient and give several rounds of epinephrine  and amiodarone  CPR but patient did not get return return of spontaneous circulation that was sustained.  When I first got in the room he was hypoxic and respiratory arrest lost pulses look to have a narrow rhythm tachycardia.  We proceeded with epinephrine  and CPR Ambu bag.  Eventually looks like he went into a wide rhythm tachycardia he was defibrillated given amiodarone  intubated.  After intubation pulse ox is still in the upper 80s low 90s.  He still had asystole.  We would get about 30 seconds of pulses and then he would lose pulses.  Ultimately after about 25 minutes or so of CPR family was at the bedside and they were okay with discontinuing efforts which I think was the right thing to do.  Overall patient was pronounced deceased at 55.  Nursing staff to make hospitalist team aware.  Dr. Franky made aware.  This chart was dictated using voice recognition software.  Despite best efforts to proofread,  errors can occur which can change the documentation meaning.    Ruthe Cornet, DO 02/13/2024 2011    Ruthe Cornet, DO 03/07/2024 2353

## 2024-02-22 ENCOUNTER — Encounter (HOSPITAL_COMMUNITY)

## 2024-02-22 MED FILL — Calcium Chloride Inj 10%: INTRAVENOUS | Qty: 10 | Status: AC

## 2024-02-26 LAB — CULTURE, BLOOD (ROUTINE X 2)
Culture: NO GROWTH
Special Requests: ADEQUATE

## 2024-02-27 ENCOUNTER — Encounter (HOSPITAL_COMMUNITY)

## 2024-02-29 ENCOUNTER — Encounter (HOSPITAL_COMMUNITY)

## 2024-03-05 ENCOUNTER — Encounter (HOSPITAL_COMMUNITY)

## 2024-03-07 ENCOUNTER — Encounter (HOSPITAL_COMMUNITY)

## 2024-03-10 NOTE — ED Notes (Signed)
 Pt was transported to the morgue and Patient placement notified.

## 2024-03-10 NOTE — Death Summary Note (Addendum)
 "  DEATH SUMMARY   Patient Details  Name: William [name] MRN: [id number] DOB: 11/18/1950 PCP:No primary care provider on file. Admission/Discharge Information   Admit Date:  March 22, 2024  Date of Death: Date of Death: 22-Mar-2024  Time of Death: Time of Death: 1958  Length of Stay: 1   Principle Cause of death: Interstitial Lung Disease  HPI: William [name] is a 74 y.o. male with a past medical history of coronary artery disease status post CABG, chronic respiratory failure on oxygen  at home, history of interstitial lung disease, essential hypertension, history of liver transplant, status post TAVR who has been following with pulmonology at [facility] for interstitial lung disease.  Etiology has been unclear.  Patient has been on chronic steroids for same.  He has been undergoing pulmonary rehab as well but for the past week or so he has been feeling worse than usual.  Based on records from [facility] it appears that he has been having exacerbations on and off for the past 1 to 2 months.  He has not been able to keep up with his pulmonary rehab.  At baseline he uses anywhere from 6 to 10 L of oxygen  at rest and increases to 15 L when he is exerting himself.  He has felt very fatigued as result of his symptoms.  He denies any fever or chills.  No sick contacts.  Has had occasional cough with clear expectoration.  Denies any swelling in his legs.  His weight has not changed much in the last few months.  Denies any chest pain.   In the emergency department he was initially noted to be saturating in the 70s.  He was placed on a nonrebreather.  Evaluation raised concern for pulmonary fibrosis versus volume overload versus pneumonia.  He was hospitalized for further management.  HOSPITAL COURSE  Acute on chronic respiratory failure with hypoxia/interstitial lung disease exacerbation/volume overload Recently done echocardiogram at [facility] showed normal LVEF.  Diastolic parameters were indeterminate.  Patient uses high  amounts of oxygen  at home at baseline.  6 to 10 L at rest and 5 to 15 L or more with exertion. Patient's shortness of breath has worsened in the last 1 week or so.  Chest x-ray shows chronic changes.  COVID-19 influenza RSV PCR negative. Patient was given furosemide  and steroids.  He WAS requiring heated high flow oxygen .  Does not want to be transferred to [facility] if possible.   Seen by pulmonology. They increased steroid dose.    History of liver transplant This was about 5 years ago.  Followed at [facility].  LFTs are stable.  Continue with his immunosuppressants including CellCept  and tacrolimus .   Chronic kidney disease stage IIIb Renal function seems to be close to baseline.   Leukocytosis Could be from steroid use.  Could be from acute infection.  Continue to monitor.  He is however afebrile.   Diabetes mellitus type 2 Hold metformin .  CBGs will be checked.  SSI.   Essential hypertension Amlodipine  on hold.  Will resume metoprolol  from tomorrow at a lower dose.  It appears that patient got acutely short of breath while trying to have BM. He became quite hypoxic and then went into cardiac arrest. Despite resuscitative efforts there was no ROSC. After discussions with patient's family further efforts were discontinued. He was pronounced dead on March 22, 2024 at 7:58PM.     The results of significant diagnostics from this hospitalization (including imaging, microbiology, ancillary and laboratory) are listed below for reference.  Significant Diagnostic Studies: CT CHEST WO CONTRAST Result Date: Mar 11, 2024 CLINICAL DATA:  Interstitial lung disease flare versus volume overload. Hypoxia. Worsening shortness of breath over the past week. EXAM: CT CHEST WITHOUT CONTRAST TECHNIQUE: Multidetector CT imaging of the chest was performed following the standard protocol without IV contrast. RADIATION DOSE REDUCTION: This exam was performed according to the departmental dose-optimization program which includes  automated exposure control, adjustment of the mA and/or kV according to patient size and/or use of iterative reconstruction technique. COMPARISON:  04/25/2018 FINDINGS: Cardiovascular: Median sternotomy wires are present. Heart is normal size. Evidence of previous TAVR. Calcified plaque over the left main and 3 vessel coronary arteries. Thoracic aorta is normal in caliber. Mild calcified plaque throughout the descending thoracic aorta. Pulmonary arteries and remaining vascular structures are unremarkable on this noncontrast exam. Mediastinum/Nodes: Stable 1 cm AP window lymph node. Cluster of small right paratracheal lymph nodes slightly more prominent. No other significant mediastinal or hilar adenopathy. Remaining mediastinal structures are unremarkable. Lungs/Pleura: Lungs are adequately inflated. Evidence of patchy bilateral pulmonary fibrosis worse over the lower lungs with associated mild traction bronchiectasis. No concerning superimposed airspace process. Less chronic glass opacification over the mid to upper lobes as seen previously. No concerning pulmonary nodules/masses. Central airways are normal. Upper Abdomen: Mild calcified plaque over the abdominal aorta with possible beginning of aortic stent graft on the most inferior image. No acute findings in the upper abdomen. Musculoskeletal: No acute findings. IMPRESSION: 1. No acute cardiopulmonary disease. 2. Evidence of stable patchy bilateral pulmonary fibrosis worse over the lower lungs. No concerning superimposed acute airspace process. 3. Aortic atherosclerosis. Atherosclerotic coronary artery disease. Aortic Atherosclerosis (ICD10-I70.0). Electronically Signed   By: Toribio Agreste M.D.   On: Mar 11, 2024 14:34   DG Chest Port 1 View Result Date: 2024-03-11 CLINICAL DATA:  Shortness of breath EXAM: PORTABLE CHEST 1 VIEW COMPARISON:  November 30, 2023 FINDINGS: Stable cardiomegaly. Status post transcatheter aortic valve repair as well as coronary artery  bypass graft. Stable bibasilar reticular opacities are noted most consistent with pulmonary fibrosis or other chronic interstitial lung disease. No definite acute pulmonary abnormality is noted. Bony thorax is unremarkable. IMPRESSION: Stable bibasilar reticular opacities most consistent with pulmonary fibrosis or other chronic interstitial lung disease. No definite acute pulmonary abnormality is noted. Electronically Signed   By: Lynwood Landy Raddle M.D.   On: 11-Mar-2024 10:21    Microbiology: Recent Results (from the past 240 hours)  Resp panel by RT-PCR (RSV, Flu A&B, Covid) Anterior Nasal Swab     Status: None   Collection Time: March 11, 2024  9:36 AM   Specimen: Anterior Nasal Swab  Result Value Ref Range Status   SARS Coronavirus 2 by RT PCR NEGATIVE NEGATIVE Final   Influenza A by PCR NEGATIVE NEGATIVE Final   Influenza B by PCR NEGATIVE NEGATIVE Final    Comment: (NOTE) The Xpert Xpress SARS-CoV-2/FLU/RSV plus assay is intended as an aid in the diagnosis of influenza from Nasopharyngeal swab specimens and should not be used as a sole basis for treatment. Nasal washings and aspirates are unacceptable for Xpert Xpress SARS-CoV-2/FLU/RSV testing.  Fact Sheet for Patients: bloggercourse.com  Fact Sheet for Healthcare Providers: seriousbroker.it  This test is not yet approved or cleared by the [place]  FDA and has been authorized for detection and/or diagnosis of SARS-CoV-2 by FDA under an Emergency Use Authorization (EUA). This EUA will remain in effect (meaning this test can be used) for the duration of the COVID-19 declaration under Section 564(b)(1) of  the Act, 21 U.S.C. section 360bbb-3(b)(1), unless the authorization is terminated or revoked.     Resp Syncytial Virus by PCR NEGATIVE NEGATIVE Final    Comment: (NOTE) Fact Sheet for Patients: bloggercourse.com  Fact Sheet for Healthcare  Providers: seriousbroker.it  This test is not yet approved or cleared by the [place]  FDA and has been authorized for detection and/or diagnosis of SARS-CoV-2 by FDA under an Emergency Use Authorization (EUA). This EUA will remain in effect (meaning this test can be used) for the duration of the COVID-19 declaration under Section 564(b)(1) of the Act, 21 U.S.C. section 360bbb-3(b)(1), unless the authorization is terminated or revoked.  Performed at [facility], [address], [place], [place] [address]   Culture, blood (routine x 2)     Status: None (Preliminary result)   Collection Time: 03/06/2024  9:36 AM   Specimen: BLOOD LEFT ARM  Result Value Ref Range Status   Specimen Description BLOOD LEFT ARM  Final   Special Requests   Final    BOTTLES DRAWN AEROBIC AND ANAEROBIC Blood Culture adequate volume   Culture   Final    NO GROWTH 2 DAYS Performed at [facility], [address], [place], [place] [address]    Report Status PENDING  Incomplete    Joette Pebbles, MD    "

## 2024-03-10 DEATH — deceased

## 2024-03-12 ENCOUNTER — Encounter (HOSPITAL_COMMUNITY)

## 2024-03-13 ENCOUNTER — Other Ambulatory Visit (HOSPITAL_COMMUNITY): Payer: Self-pay

## 2024-03-14 ENCOUNTER — Encounter (HOSPITAL_COMMUNITY)

## 2024-03-15 ENCOUNTER — Ambulatory Visit: Admitting: Family Medicine

## 2024-07-08 ENCOUNTER — Other Ambulatory Visit (HOSPITAL_COMMUNITY)

## 2024-07-08 ENCOUNTER — Ambulatory Visit: Admitting: Physician Assistant

## 2024-08-07 ENCOUNTER — Encounter
